# Patient Record
Sex: Female | Born: 1955 | Race: Black or African American | Hispanic: No | Smoking: Never smoker
Health system: Southern US, Community
[De-identification: ages and names within clinical notes are randomized; demographics above are authoritative.]

## PROBLEM LIST (undated history)

## (undated) DIAGNOSIS — I272 Pulmonary hypertension, unspecified: Secondary | ICD-10-CM

## (undated) DIAGNOSIS — F99 Mental disorder, not otherwise specified: Secondary | ICD-10-CM

## (undated) DIAGNOSIS — H547 Unspecified visual loss: Secondary | ICD-10-CM

## (undated) DIAGNOSIS — I10 Essential (primary) hypertension: Secondary | ICD-10-CM

## (undated) DIAGNOSIS — I509 Heart failure, unspecified: Secondary | ICD-10-CM

## (undated) DIAGNOSIS — N186 End stage renal disease: Secondary | ICD-10-CM

## (undated) DIAGNOSIS — I2510 Atherosclerotic heart disease of native coronary artery without angina pectoris: Secondary | ICD-10-CM

## (undated) DIAGNOSIS — I469 Cardiac arrest, cause unspecified: Secondary | ICD-10-CM

## (undated) DIAGNOSIS — D259 Leiomyoma of uterus, unspecified: Secondary | ICD-10-CM

## (undated) DIAGNOSIS — E785 Hyperlipidemia, unspecified: Secondary | ICD-10-CM

## (undated) DIAGNOSIS — R011 Cardiac murmur, unspecified: Secondary | ICD-10-CM

## (undated) DIAGNOSIS — I071 Rheumatic tricuspid insufficiency: Secondary | ICD-10-CM

## (undated) DIAGNOSIS — I219 Acute myocardial infarction, unspecified: Secondary | ICD-10-CM

## (undated) DIAGNOSIS — H9190 Unspecified hearing loss, unspecified ear: Secondary | ICD-10-CM

## (undated) DIAGNOSIS — M869 Osteomyelitis, unspecified: Secondary | ICD-10-CM

## (undated) DIAGNOSIS — N189 Chronic kidney disease, unspecified: Secondary | ICD-10-CM

## (undated) DIAGNOSIS — D649 Anemia, unspecified: Secondary | ICD-10-CM

## (undated) DIAGNOSIS — I120 Hypertensive chronic kidney disease with stage 5 chronic kidney disease or end stage renal disease: Secondary | ICD-10-CM

## (undated) DIAGNOSIS — R600 Localized edema: Secondary | ICD-10-CM

## (undated) DIAGNOSIS — D631 Anemia in chronic kidney disease: Secondary | ICD-10-CM

## (undated) DIAGNOSIS — Z992 Dependence on renal dialysis: Secondary | ICD-10-CM

## (undated) HISTORY — DX: Essential (primary) hypertension: I10

## (undated) HISTORY — DX: Leiomyoma of uterus, unspecified: D25.9

## (undated) HISTORY — DX: Cardiac arrest, cause unspecified: I46.9

## (undated) HISTORY — DX: Anemia, unspecified: D64.9

## (undated) HISTORY — PX: NO PAST SURGERIES: SHX2092

## (undated) HISTORY — DX: Rheumatic tricuspid insufficiency: I07.1

## (undated) HISTORY — DX: Hypertensive chronic kidney disease with stage 5 chronic kidney disease or end stage renal disease: I12.0

## (undated) HISTORY — DX: Anemia in chronic kidney disease: D63.1

## (undated) HISTORY — DX: Pulmonary hypertension, unspecified: I27.20

## (undated) HISTORY — DX: Chronic kidney disease, unspecified: N18.9

## (undated) HISTORY — DX: Unspecified hearing loss, unspecified ear: H91.90

## (undated) HISTORY — DX: Mental disorder, not otherwise specified: F99

## (undated) HISTORY — DX: Localized edema: R60.0

## (undated) HISTORY — DX: Hyperlipidemia, unspecified: E78.5

## (undated) HISTORY — DX: End stage renal disease: N18.6

---

## 2003-08-16 ENCOUNTER — Emergency Department (HOSPITAL_COMMUNITY): Admission: EM | Admit: 2003-08-16 | Discharge: 2003-08-16 | Payer: Self-pay | Admitting: Emergency Medicine

## 2003-08-20 ENCOUNTER — Other Ambulatory Visit: Admission: RE | Admit: 2003-08-20 | Discharge: 2003-08-20 | Payer: Self-pay | Admitting: Obstetrics and Gynecology

## 2004-11-22 ENCOUNTER — Inpatient Hospital Stay (HOSPITAL_COMMUNITY): Admission: AD | Admit: 2004-11-22 | Discharge: 2004-11-23 | Payer: Self-pay | Admitting: *Deleted

## 2004-12-05 ENCOUNTER — Ambulatory Visit (HOSPITAL_COMMUNITY): Admission: RE | Admit: 2004-12-05 | Discharge: 2004-12-05 | Payer: Self-pay | Admitting: Obstetrics and Gynecology

## 2004-12-05 ENCOUNTER — Encounter (INDEPENDENT_AMBULATORY_CARE_PROVIDER_SITE_OTHER): Payer: Self-pay | Admitting: Specialist

## 2004-12-05 ENCOUNTER — Ambulatory Visit: Payer: Self-pay | Admitting: Obstetrics and Gynecology

## 2006-05-01 ENCOUNTER — Emergency Department (HOSPITAL_COMMUNITY): Admission: EM | Admit: 2006-05-01 | Discharge: 2006-05-01 | Payer: Self-pay | Admitting: Family Medicine

## 2006-12-24 ENCOUNTER — Inpatient Hospital Stay (HOSPITAL_COMMUNITY): Admission: AD | Admit: 2006-12-24 | Discharge: 2006-12-25 | Payer: Self-pay | Admitting: Gynecology

## 2006-12-30 ENCOUNTER — Inpatient Hospital Stay (HOSPITAL_COMMUNITY): Admission: AD | Admit: 2006-12-30 | Discharge: 2006-12-30 | Payer: Self-pay | Admitting: Obstetrics and Gynecology

## 2006-12-31 ENCOUNTER — Inpatient Hospital Stay (HOSPITAL_COMMUNITY): Admission: AD | Admit: 2006-12-31 | Discharge: 2006-12-31 | Payer: Self-pay | Admitting: Obstetrics and Gynecology

## 2007-01-04 ENCOUNTER — Inpatient Hospital Stay (HOSPITAL_COMMUNITY): Admission: AD | Admit: 2007-01-04 | Discharge: 2007-01-04 | Payer: Self-pay | Admitting: Obstetrics and Gynecology

## 2007-02-02 ENCOUNTER — Emergency Department (HOSPITAL_COMMUNITY): Admission: EM | Admit: 2007-02-02 | Discharge: 2007-02-03 | Payer: Self-pay | Admitting: Family Medicine

## 2007-04-23 ENCOUNTER — Inpatient Hospital Stay (HOSPITAL_COMMUNITY): Admission: AD | Admit: 2007-04-23 | Discharge: 2007-04-23 | Payer: Self-pay | Admitting: Gynecology

## 2007-05-08 ENCOUNTER — Encounter: Payer: Self-pay | Admitting: Obstetrics and Gynecology

## 2007-05-08 ENCOUNTER — Ambulatory Visit: Payer: Self-pay | Admitting: Obstetrics and Gynecology

## 2007-05-22 ENCOUNTER — Ambulatory Visit (HOSPITAL_COMMUNITY): Admission: RE | Admit: 2007-05-22 | Discharge: 2007-05-22 | Payer: Self-pay | Admitting: Obstetrics and Gynecology

## 2007-06-27 ENCOUNTER — Emergency Department (HOSPITAL_COMMUNITY): Admission: EM | Admit: 2007-06-27 | Discharge: 2007-06-27 | Payer: Self-pay | Admitting: Family Medicine

## 2007-11-19 ENCOUNTER — Inpatient Hospital Stay (HOSPITAL_COMMUNITY): Admission: AD | Admit: 2007-11-19 | Discharge: 2007-11-19 | Payer: Self-pay | Admitting: Gynecology

## 2007-12-05 ENCOUNTER — Inpatient Hospital Stay (HOSPITAL_COMMUNITY): Admission: AD | Admit: 2007-12-05 | Discharge: 2007-12-05 | Payer: Self-pay | Admitting: Obstetrics & Gynecology

## 2007-12-12 ENCOUNTER — Ambulatory Visit: Payer: Self-pay | Admitting: Obstetrics & Gynecology

## 2007-12-13 ENCOUNTER — Observation Stay (HOSPITAL_COMMUNITY): Admission: AD | Admit: 2007-12-13 | Discharge: 2007-12-14 | Payer: Self-pay | Admitting: Obstetrics and Gynecology

## 2007-12-23 ENCOUNTER — Ambulatory Visit: Payer: Self-pay | Admitting: Obstetrics and Gynecology

## 2007-12-23 ENCOUNTER — Encounter: Payer: Self-pay | Admitting: Obstetrics and Gynecology

## 2007-12-23 ENCOUNTER — Ambulatory Visit (HOSPITAL_COMMUNITY): Admission: RE | Admit: 2007-12-23 | Discharge: 2007-12-23 | Payer: Self-pay | Admitting: Obstetrics and Gynecology

## 2008-01-20 ENCOUNTER — Emergency Department (HOSPITAL_COMMUNITY): Admission: EM | Admit: 2008-01-20 | Discharge: 2008-01-20 | Payer: Self-pay | Admitting: Family Medicine

## 2008-10-13 ENCOUNTER — Inpatient Hospital Stay (HOSPITAL_COMMUNITY): Admission: AD | Admit: 2008-10-13 | Discharge: 2008-10-13 | Payer: Self-pay | Admitting: Obstetrics & Gynecology

## 2009-01-19 ENCOUNTER — Emergency Department (HOSPITAL_COMMUNITY): Admission: EM | Admit: 2009-01-19 | Discharge: 2009-01-19 | Payer: Self-pay | Admitting: Emergency Medicine

## 2009-02-02 ENCOUNTER — Emergency Department (HOSPITAL_COMMUNITY): Admission: EM | Admit: 2009-02-02 | Discharge: 2009-02-02 | Payer: Self-pay | Admitting: Family Medicine

## 2009-07-05 ENCOUNTER — Ambulatory Visit: Payer: Self-pay | Admitting: Advanced Practice Midwife

## 2009-07-05 ENCOUNTER — Inpatient Hospital Stay (HOSPITAL_COMMUNITY): Admission: AD | Admit: 2009-07-05 | Discharge: 2009-07-06 | Payer: Self-pay | Admitting: Obstetrics & Gynecology

## 2009-07-19 ENCOUNTER — Emergency Department (HOSPITAL_COMMUNITY): Admission: EM | Admit: 2009-07-19 | Discharge: 2009-07-19 | Payer: Self-pay | Admitting: Family Medicine

## 2009-07-20 ENCOUNTER — Inpatient Hospital Stay (HOSPITAL_COMMUNITY): Admission: AD | Admit: 2009-07-20 | Discharge: 2009-07-20 | Payer: Self-pay | Admitting: Obstetrics & Gynecology

## 2009-08-04 ENCOUNTER — Encounter: Payer: Self-pay | Admitting: Obstetrics and Gynecology

## 2009-08-04 ENCOUNTER — Ambulatory Visit: Payer: Self-pay | Admitting: Obstetrics and Gynecology

## 2009-08-04 LAB — CONVERTED CEMR LAB
ALT: 14 units/L (ref 0–35)
AST: 11 units/L (ref 0–37)
Albumin: 3.9 g/dL (ref 3.5–5.2)
Alkaline Phosphatase: 89 units/L (ref 39–117)
BUN: 11 mg/dL (ref 6–23)
CO2: 23 meq/L (ref 19–32)
Calcium: 8.8 mg/dL (ref 8.4–10.5)
Chloride: 96 meq/L (ref 96–112)
Creatinine, Ser: 0.8 mg/dL (ref 0.40–1.20)
FSH: 51.8 milliintl units/mL
Glucose, Bld: 542 mg/dL (ref 70–99)
HCT: 34.3 % — ABNORMAL LOW (ref 36.0–46.0)
Hemoglobin: 11.1 g/dL — ABNORMAL LOW (ref 12.0–15.0)
LH: 33.5 milliintl units/mL
MCHC: 32.4 g/dL (ref 30.0–36.0)
MCV: 92.5 fL (ref 78.0–100.0)
Platelets: 435 10*3/uL — ABNORMAL HIGH (ref 150–400)
Potassium: 4.2 meq/L (ref 3.5–5.3)
RBC: 3.71 M/uL — ABNORMAL LOW (ref 3.87–5.11)
RDW: 13.1 % (ref 11.5–15.5)
Sodium: 132 meq/L — ABNORMAL LOW (ref 135–145)
Total Bilirubin: 0.4 mg/dL (ref 0.3–1.2)
Total Protein: 7.3 g/dL (ref 6.0–8.3)
Trich, Wet Prep: NONE SEEN
WBC: 5.5 10*3/uL (ref 4.0–10.5)
Yeast Wet Prep HPF POC: NONE SEEN

## 2009-08-05 ENCOUNTER — Encounter: Payer: Self-pay | Admitting: Family Medicine

## 2009-08-09 ENCOUNTER — Ambulatory Visit: Payer: Self-pay | Admitting: Family Medicine

## 2009-08-09 ENCOUNTER — Encounter: Payer: Self-pay | Admitting: Family Medicine

## 2009-08-09 DIAGNOSIS — D259 Leiomyoma of uterus, unspecified: Secondary | ICD-10-CM | POA: Insufficient documentation

## 2009-08-09 DIAGNOSIS — F489 Nonpsychotic mental disorder, unspecified: Secondary | ICD-10-CM | POA: Insufficient documentation

## 2009-08-09 DIAGNOSIS — I110 Hypertensive heart disease with heart failure: Secondary | ICD-10-CM | POA: Insufficient documentation

## 2009-08-09 DIAGNOSIS — I10 Essential (primary) hypertension: Secondary | ICD-10-CM

## 2009-08-09 DIAGNOSIS — Z780 Asymptomatic menopausal state: Secondary | ICD-10-CM | POA: Insufficient documentation

## 2009-08-09 DIAGNOSIS — E119 Type 2 diabetes mellitus without complications: Secondary | ICD-10-CM | POA: Insufficient documentation

## 2009-08-09 HISTORY — DX: Leiomyoma of uterus, unspecified: D25.9

## 2009-08-09 HISTORY — DX: Essential (primary) hypertension: I10

## 2009-08-09 LAB — CONVERTED CEMR LAB
ALT: 16 units/L (ref 0–35)
AST: 14 units/L (ref 0–37)
Albumin: 3.9 g/dL (ref 3.5–5.2)
Alkaline Phosphatase: 93 units/L (ref 39–117)
BUN: 17 mg/dL (ref 6–23)
CO2: 25 meq/L (ref 19–32)
Calcium: 9.5 mg/dL (ref 8.4–10.5)
Chloride: 93 meq/L — ABNORMAL LOW (ref 96–112)
Creatinine, Ser: 0.87 mg/dL (ref 0.40–1.20)
Direct LDL: 170 mg/dL — ABNORMAL HIGH
Glucose, Bld: 541 mg/dL (ref 70–99)
Hgb A1c MFr Bld: 10.5 %
Potassium: 4.4 meq/L (ref 3.5–5.3)
Sodium: 132 meq/L — ABNORMAL LOW (ref 135–145)
TSH: 1.785 microintl units/mL (ref 0.350–4.500)
Total Bilirubin: 0.3 mg/dL (ref 0.3–1.2)
Total Protein: 7.5 g/dL (ref 6.0–8.3)

## 2009-08-19 ENCOUNTER — Ambulatory Visit: Payer: Self-pay | Admitting: Obstetrics and Gynecology

## 2009-08-27 ENCOUNTER — Ambulatory Visit (HOSPITAL_COMMUNITY): Admission: RE | Admit: 2009-08-27 | Discharge: 2009-08-27 | Payer: Self-pay | Admitting: Family Medicine

## 2009-09-01 ENCOUNTER — Ambulatory Visit: Payer: Self-pay | Admitting: Family Medicine

## 2009-09-22 ENCOUNTER — Telehealth: Payer: Self-pay | Admitting: *Deleted

## 2009-09-22 ENCOUNTER — Ambulatory Visit: Payer: Self-pay | Admitting: Family Medicine

## 2009-09-25 ENCOUNTER — Telehealth: Payer: Self-pay | Admitting: Family Medicine

## 2009-09-25 ENCOUNTER — Emergency Department (HOSPITAL_COMMUNITY): Admission: EM | Admit: 2009-09-25 | Discharge: 2009-09-25 | Payer: Self-pay | Admitting: Family Medicine

## 2009-09-26 ENCOUNTER — Telehealth: Payer: Self-pay | Admitting: Family Medicine

## 2009-09-28 ENCOUNTER — Emergency Department (HOSPITAL_COMMUNITY): Admission: EM | Admit: 2009-09-28 | Discharge: 2009-09-29 | Payer: Self-pay | Admitting: Emergency Medicine

## 2009-09-28 ENCOUNTER — Ambulatory Visit: Payer: Self-pay | Admitting: Family Medicine

## 2009-09-29 ENCOUNTER — Observation Stay (HOSPITAL_COMMUNITY): Admission: AD | Admit: 2009-09-29 | Discharge: 2009-09-30 | Payer: Self-pay | Admitting: Emergency Medicine

## 2009-09-29 ENCOUNTER — Ambulatory Visit: Payer: Self-pay | Admitting: Family Medicine

## 2009-09-29 ENCOUNTER — Encounter: Payer: Self-pay | Admitting: Family Medicine

## 2009-09-29 ENCOUNTER — Telehealth: Payer: Self-pay | Admitting: Family Medicine

## 2009-10-05 ENCOUNTER — Ambulatory Visit: Payer: Self-pay | Admitting: Family Medicine

## 2009-10-08 ENCOUNTER — Telehealth: Payer: Self-pay | Admitting: Family Medicine

## 2009-10-27 ENCOUNTER — Telehealth: Payer: Self-pay | Admitting: *Deleted

## 2009-10-27 ENCOUNTER — Telehealth: Payer: Self-pay | Admitting: Family Medicine

## 2009-12-03 ENCOUNTER — Ambulatory Visit: Payer: Self-pay | Admitting: Family Medicine

## 2009-12-03 ENCOUNTER — Encounter: Payer: Self-pay | Admitting: *Deleted

## 2009-12-03 DIAGNOSIS — H913 Deaf nonspeaking, not elsewhere classified: Secondary | ICD-10-CM | POA: Insufficient documentation

## 2009-12-06 ENCOUNTER — Telehealth: Payer: Self-pay | Admitting: Family Medicine

## 2009-12-07 ENCOUNTER — Telehealth: Payer: Self-pay | Admitting: *Deleted

## 2009-12-07 ENCOUNTER — Telehealth: Payer: Self-pay | Admitting: Family Medicine

## 2009-12-07 ENCOUNTER — Encounter: Payer: Self-pay | Admitting: Family Medicine

## 2009-12-10 ENCOUNTER — Encounter: Payer: Self-pay | Admitting: Family Medicine

## 2009-12-13 ENCOUNTER — Telehealth: Payer: Self-pay | Admitting: Family Medicine

## 2010-01-04 ENCOUNTER — Ambulatory Visit: Payer: Self-pay | Admitting: Family Medicine

## 2010-01-04 LAB — CONVERTED CEMR LAB: Hgb A1c MFr Bld: 9.6 %

## 2010-01-07 ENCOUNTER — Emergency Department (HOSPITAL_COMMUNITY): Admission: EM | Admit: 2010-01-07 | Discharge: 2010-01-07 | Payer: Self-pay | Admitting: Family Medicine

## 2010-01-08 ENCOUNTER — Encounter (INDEPENDENT_AMBULATORY_CARE_PROVIDER_SITE_OTHER): Payer: Self-pay | Admitting: Specialist

## 2010-01-08 ENCOUNTER — Ambulatory Visit: Payer: Self-pay | Admitting: Surgery

## 2010-01-08 ENCOUNTER — Ambulatory Visit: Payer: Self-pay | Admitting: Family Medicine

## 2010-01-08 ENCOUNTER — Inpatient Hospital Stay (HOSPITAL_COMMUNITY): Admission: EM | Admit: 2010-01-08 | Discharge: 2010-01-09 | Payer: Self-pay | Admitting: Emergency Medicine

## 2010-01-19 ENCOUNTER — Ambulatory Visit: Payer: Self-pay | Admitting: Family Medicine

## 2010-01-19 DIAGNOSIS — R609 Edema, unspecified: Secondary | ICD-10-CM | POA: Insufficient documentation

## 2010-02-09 ENCOUNTER — Encounter: Payer: Self-pay | Admitting: Family Medicine

## 2010-02-09 ENCOUNTER — Ambulatory Visit: Payer: Self-pay | Admitting: Family Medicine

## 2010-02-10 LAB — CONVERTED CEMR LAB
ALT: 35 units/L (ref 0–35)
AST: 15 units/L (ref 0–37)
Albumin: 4.1 g/dL (ref 3.5–5.2)
Alkaline Phosphatase: 92 units/L (ref 39–117)
BUN: 20 mg/dL (ref 6–23)
CO2: 27 meq/L (ref 19–32)
Calcium: 9.7 mg/dL (ref 8.4–10.5)
Chloride: 98 meq/L (ref 96–112)
Cholesterol: 237 mg/dL — ABNORMAL HIGH (ref 0–200)
Creatinine, Ser: 0.87 mg/dL (ref 0.40–1.20)
Glucose, Bld: 225 mg/dL — ABNORMAL HIGH (ref 70–99)
HDL: 51 mg/dL (ref >39)
LDL Cholesterol: 170 mg/dL — ABNORMAL HIGH (ref 0–99)
Potassium: 4.4 meq/L (ref 3.5–5.3)
Sodium: 136 meq/L (ref 135–145)
Total Bilirubin: 0.5 mg/dL (ref 0.3–1.2)
Total CHOL/HDL Ratio: 4.6
Total Protein: 7.7 g/dL (ref 6.0–8.3)
Triglycerides: 78 mg/dL (ref <150)
VLDL: 16 mg/dL (ref 0–40)

## 2010-02-22 ENCOUNTER — Telehealth: Payer: Self-pay | Admitting: Family Medicine

## 2010-02-28 ENCOUNTER — Ambulatory Visit: Payer: Self-pay | Admitting: Family Medicine

## 2010-02-28 DIAGNOSIS — E785 Hyperlipidemia, unspecified: Secondary | ICD-10-CM | POA: Insufficient documentation

## 2010-03-23 ENCOUNTER — Ambulatory Visit: Payer: Self-pay | Admitting: Family Medicine

## 2010-04-06 ENCOUNTER — Emergency Department (HOSPITAL_COMMUNITY): Admission: EM | Admit: 2010-04-06 | Discharge: 2010-04-06 | Payer: Self-pay | Admitting: Family Medicine

## 2010-05-19 ENCOUNTER — Encounter: Payer: Self-pay | Admitting: Family Medicine

## 2010-05-19 ENCOUNTER — Ambulatory Visit: Payer: Self-pay | Admitting: Family Medicine

## 2010-05-19 DIAGNOSIS — M79609 Pain in unspecified limb: Secondary | ICD-10-CM | POA: Insufficient documentation

## 2010-05-19 LAB — CONVERTED CEMR LAB
BUN: 14 mg/dL (ref 6–23)
CO2: 27 meq/L (ref 19–32)
Calcium: 9.5 mg/dL (ref 8.4–10.5)
Chloride: 97 meq/L (ref 96–112)
Creatinine, Ser: 0.75 mg/dL (ref 0.40–1.20)
Glucose, Bld: 391 mg/dL — ABNORMAL HIGH (ref 70–99)
Potassium: 4.1 meq/L (ref 3.5–5.3)
Sodium: 136 meq/L (ref 135–145)

## 2010-06-02 ENCOUNTER — Encounter: Payer: Self-pay | Admitting: Family Medicine

## 2010-06-03 ENCOUNTER — Ambulatory Visit: Payer: Self-pay | Admitting: Family Medicine

## 2010-06-17 ENCOUNTER — Encounter: Payer: Self-pay | Admitting: Family Medicine

## 2010-06-17 ENCOUNTER — Ambulatory Visit: Payer: Self-pay | Admitting: Family Medicine

## 2010-06-17 LAB — CONVERTED CEMR LAB
BUN: 12 mg/dL (ref 6–23)
CO2: 26 meq/L (ref 19–32)
Calcium: 9.1 mg/dL (ref 8.4–10.5)
Chloride: 97 meq/L (ref 96–112)
Creatinine, Ser: 0.77 mg/dL (ref 0.40–1.20)
Glucose, Bld: 363 mg/dL — ABNORMAL HIGH (ref 70–99)
Potassium: 4.3 meq/L (ref 3.5–5.3)
Sodium: 132 meq/L — ABNORMAL LOW (ref 135–145)

## 2010-07-19 ENCOUNTER — Ambulatory Visit: Payer: Self-pay | Admitting: Family Medicine

## 2011-01-03 ENCOUNTER — Inpatient Hospital Stay (HOSPITAL_COMMUNITY)
Admission: AD | Admit: 2011-01-03 | Discharge: 2011-01-03 | Payer: Self-pay | Source: Home / Self Care | Attending: Obstetrics and Gynecology | Admitting: Obstetrics and Gynecology

## 2011-01-05 ENCOUNTER — Ambulatory Visit (HOSPITAL_COMMUNITY)
Admission: RE | Admit: 2011-01-05 | Discharge: 2011-01-05 | Payer: Self-pay | Source: Home / Self Care | Attending: Obstetrics and Gynecology | Admitting: Obstetrics and Gynecology

## 2011-01-09 LAB — CBC
HCT: 35.3 % — ABNORMAL LOW (ref 36.0–46.0)
Hemoglobin: 12 g/dL (ref 12.0–15.0)
MCH: 30.2 pg (ref 26.0–34.0)
MCHC: 34 g/dL (ref 30.0–36.0)
MCV: 88.7 fL (ref 78.0–100.0)
Platelets: 325 10*3/uL (ref 150–400)
RBC: 3.98 MIL/uL (ref 3.87–5.11)
RDW: 12.1 % (ref 11.5–15.5)
WBC: 6.2 10*3/uL (ref 4.0–10.5)

## 2011-01-09 LAB — URINALYSIS, ROUTINE W REFLEX MICROSCOPIC
Bilirubin Urine: NEGATIVE
Ketones, ur: NEGATIVE mg/dL
Leukocytes, UA: NEGATIVE
Nitrite: NEGATIVE
Protein, ur: 100 mg/dL — AB
Specific Gravity, Urine: 1.025 (ref 1.005–1.030)
Urine Glucose, Fasting: 250 mg/dL — AB
Urobilinogen, UA: 0.2 mg/dL (ref 0.0–1.0)
pH: 6 (ref 5.0–8.0)

## 2011-01-09 LAB — URINE MICROSCOPIC-ADD ON

## 2011-01-11 ENCOUNTER — Emergency Department (HOSPITAL_COMMUNITY)
Admission: EM | Admit: 2011-01-11 | Discharge: 2011-01-11 | Payer: Self-pay | Source: Home / Self Care | Admitting: Emergency Medicine

## 2011-01-11 ENCOUNTER — Ambulatory Visit
Admission: RE | Admit: 2011-01-11 | Payer: Self-pay | Source: Home / Self Care | Attending: Obstetrics and Gynecology | Admitting: Obstetrics and Gynecology

## 2011-01-16 LAB — BASIC METABOLIC PANEL
BUN: 11 mg/dL (ref 6–23)
CO2: 29 mEq/L (ref 19–32)
Calcium: 9.2 mg/dL (ref 8.4–10.5)
Chloride: 102 mEq/L (ref 96–112)
Creatinine, Ser: 0.57 mg/dL (ref 0.4–1.2)
GFR calc Af Amer: >60 mL/min (ref >60)
GFR calc non Af Amer: >60 mL/min (ref >60)
Glucose, Bld: 157 mg/dL — ABNORMAL HIGH (ref 70–99)
Potassium: 3.8 mEq/L (ref 3.5–5.1)
Sodium: 138 mEq/L (ref 135–145)

## 2011-01-16 LAB — GC/CHLAMYDIA PROBE AMP, GENITAL
Chlamydia, DNA Probe: NEGATIVE
GC Probe Amp, Genital: NEGATIVE

## 2011-01-16 LAB — DIFFERENTIAL
Basophils Absolute: 0 10*3/uL (ref 0.0–0.1)
Basophils Relative: 1 % (ref 0–1)
Eosinophils Absolute: 0.2 10*3/uL (ref 0.0–0.7)
Eosinophils Relative: 4 % (ref 0–5)
Lymphocytes Relative: 35 % (ref 12–46)
Lymphs Abs: 2.4 10*3/uL (ref 0.7–4.0)
Monocytes Absolute: 0.6 10*3/uL (ref 0.1–1.0)
Monocytes Relative: 8 % (ref 3–12)
Neutro Abs: 3.7 10*3/uL (ref 1.7–7.7)
Neutrophils Relative %: 53 % (ref 43–77)

## 2011-01-16 LAB — CBC
HCT: 33.9 % — ABNORMAL LOW (ref 36.0–46.0)
Hemoglobin: 11 g/dL — ABNORMAL LOW (ref 12.0–15.0)
MCH: 29 pg (ref 26.0–34.0)
MCHC: 32.4 g/dL (ref 30.0–36.0)
MCV: 89.4 fL (ref 78.0–100.0)
Platelets: 393 10*3/uL (ref 150–400)
RBC: 3.79 MIL/uL — ABNORMAL LOW (ref 3.87–5.11)
RDW: 12.2 % (ref 11.5–15.5)
WBC: 7 10*3/uL (ref 4.0–10.5)

## 2011-01-16 LAB — WET PREP, GENITAL
Clue Cells Wet Prep HPF POC: NONE SEEN
Trich, Wet Prep: NONE SEEN
Yeast Wet Prep HPF POC: NONE SEEN

## 2011-01-16 LAB — PROTIME-INR
INR: 0.89 (ref 0.00–1.49)
Prothrombin Time: 12.3 seconds (ref 11.6–15.2)

## 2011-01-16 LAB — APTT: aPTT: 27 seconds (ref 24–37)

## 2011-01-16 LAB — POCT PREGNANCY, URINE: Preg Test, Ur: NEGATIVE

## 2011-01-24 NOTE — Miscellaneous (Signed)
Summary: new dm supplies  Clinical Lists Changes she needs a new meter, strips & lancets. Pharmacy unable to get the strips or lancets. dtr states they have always had a problem with supplies. told her I will ask md to order one that is common & easy to get refills for. to pcp. [facility] on [address]. sh eis totally out of supplies.[name] Radon RN  December 10, 2009 11:52 AM  Medications: Added new medication of PRODIGY BLOOD GLUCOSE MONITOR  DEVI (BLOOD GLUCOSE MONITORING SUPPL) for checking sugars two times a day - Signed Changed medication from LANCETS  MISC (LANCETS) lancets for blood sugar testing three times a day; disp 1 month's supply to PRODIGY TWIST TOP LANCETS 28G  MISC (LANCETS) for checking sugars two times a day; disp QS for 3 months - Signed Added new medication of PRODIGY BLOOD GLUCOSE TEST  STRP (GLUCOSE BLOOD) for checking sugars two times a day ; disp 1 box - Signed Rx of PRODIGY BLOOD GLUCOSE MONITOR  DEVI (BLOOD GLUCOSE MONITORING SUPPL) for checking sugars two times a day;  #1 x 0;  Signed;  Entered by: Eugenie Norrie  MD;  Authorized by: Eugenie Norrie  MD;  Method used: Electronically to [facility]  [phone]*, [address] [place], [place], [place]  [address], Ph: [phone] or [phone], Fax: [phone] Rx of PRODIGY TWIST TOP LANCETS 28G  MISC (LANCETS) for checking sugars two times a day; disp QS for 3 months;  #1 x 11;  Signed;  Entered by: Eugenie Norrie  MD;  Authorized by: Eugenie Norrie  MD;  Method used: Electronically to [facility]  [phone]*, [address] [place], [place], [place]  [address], Ph: [phone] or [phone], Fax: [phone] Rx of PRODIGY BLOOD GLUCOSE TEST  STRP (GLUCOSE BLOOD) for checking sugars two times a day ; disp 1 box;  #1 x 11;  Signed;  Entered by: Eugenie Norrie  MD;  Authorized by: Eugenie Norrie  MD;  Method used: Electronically to [facility]  [phone]*, [address] [place], [place], [place]  [address], Ph: [phone] or [phone], Fax: [phone]    Prescriptions: PRODIGY BLOOD GLUCOSE TEST  STRP (GLUCOSE BLOOD) for checking sugars two times a day ; disp 1 box  #1 x 11   Entered and Authorized by:   Eugenie Norrie  MD   Signed by:   Eugenie Norrie  MD on 12/10/2009   Method used:   Electronically to        [facility]  [phone]* (retail)       [place], [place]  [address]       Ph: [phone] or [phone]       Fax: [phone]   RxID:   [id number] PRODIGY TWIST TOP LANCETS 28G  MISC (LANCETS) for checking sugars two times a day; disp QS for 3 months  #1 x 11   Entered and Authorized by:   Eugenie Norrie  MD   Signed by:   Eugenie Norrie  MD on 12/10/2009   Method used:   Electronically to        [facility]  [phone]* (retail)       [address]       [place],   [address]       Ph: [phone] or [phone]  Fax: [phone]   RxIDAA:[id number] PRODIGY BLOOD GLUCOSE MONITOR  DEVI (BLOOD GLUCOSE MONITORING SUPPL) for checking sugars two times a day  #1 x 0   Entered and Authorized by:   Eugenie Norrie  MD   Signed by:   Eugenie Norrie  MD on 12/10/2009   Method used:   Electronically to        [facility]  [phone]* (retail)       [address]       [place], [place]  [address]       Ph: [phone] or [phone]       Fax: [phone]   RxID:   [id number]

## 2011-01-24 NOTE — Assessment & Plan Note (Signed)
Summary: f/u eo   Vital Signs:  Patient profile:   55 year old female Height:      66 inches Weight:      189 pounds BMI:     30.62 BSA:     1.95 Temp:     98.4 degrees F Pulse rate:   118 / minute BP sitting:   160 / 94  Vitals Entered By: Christen Bame CMA (February 09, 2010 11:01 AM)  Serial Vital Signs/Assessments:  Comments: 11:02 AM Manual BP: 160/94 By: Christen Bame CMA   CC: f/u Pain Assessment Patient in pain? no        Primary Care Provider:  Eugenie Norrie  MD  CC:  f/u.  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 Pt persistently has issues with adhering to medication regimen, psych issues interplay in to this frequently. Refuses to be seen again at mental health.  **entire visit conducted with sign language interpreter  1. HTN Brings in her meds today. Hasn't taken anything because she was planning to have blood drawn and hasn't had anything to eat or drink. Pill box count indicates that she has the right meds in place. Has not taken meds for today but they are correct. States that she has checked her blood pressure at [facility] and it was better than it is today.  Previously admitted to the hospital to see if medications were working and it was documented that they do effectively reduce her blood pressure into the normal range.   2. diabetes A1c in January 9.6. On metformin 500 mg daily. States that she's taking this medicine. Sugars are from 100s to 300s.   Habits & Providers  Alcohol-Tobacco-Diet     Tobacco Status: never  Current Medications (verified): 1)  Hydrochlorothiazide 25 Mg Tabs (Hydrochlorothiazide) .... One By Mouth Daily 2)  Amlodipine Besylate 10 Mg Tabs (Amlodipine Besylate) .... One By Mouth Daily For Blood Pressure 3)  Metformin Hcl 1000 Mg Tabs (Metformin Hcl) .... One By Mouth Two Times A Day 4)  Lisinopril 20 Mg Tabs (Lisinopril) .... One  By Mouth Daily For Blood Pressure 5)  Blood Glucose Test  Strp (Glucose Blood) .... Please Check Sugars Three Times A Day; Disp 1 Month Supply 6)  Relion Lancing Device  Misc (Lancet Devices) .... For Checking Sugars Two Times A Day 7)  Relion Confirm Glucose Monitor W/device Kit (Blood Glucose Monitoring Suppl) .... For Checking Sugars Two Times A Day 8)  Relion Blood Glucose Test  Strp (Glucose Blood) .... For Checking Sugars Two Times A Day  Allergies (verified): No Known Drug Allergies  Past History:  Past Medical History: HTN - poorly controlled diabetes - longstanding per pt's daughter with history of nonadherence mental health issues (unknown type) - schizophrenia-spectrum with some paranoia per the pt's daughter;  pt will not sign ROI to get records  QTc of 466 10/10  uterine fibroids (endometrial biopsy not possible per OB-gyn)  DUB - endometrial biopsy deemed impossible by clinic at [facility].   Family History: Reviewed history from 09/01/2009 and no changes required. no family history of HTN, diabetes, mental health problems, bleeding or clotting disorders, or cancer per the patient's daughter  Social History: Reviewed history from 12/03/2009 and no changes required. Lives with husband who is apparently also non-verbal. Has a daughter who can help with medicines and directions [name] [phone]) but is sometimes overwhelmed by the patient's issues.  HH had previously been going out to the house but has since d/c'd the patient. HHN Stephanie [phone]).   Review of Systems       no chest pain, SOB. Leg is feeling better.   Physical Exam  Additional Exam:  General:  Vital signs reviewed -- obese, hypertensive. Alert, oriented x 3. WD/WN Lungs:  work of breathing unlabored, clear to auscultation bilaterally; no wheezes, rales, or ronchi; good air movement throughout Heart:  regular rate and rhythm, no murmurs; normal s1/s2 Extremities:  trace BLE edema; no  erythema, drainage, or discharge on either extremity. MSK: has a small 8 x 8 mm healing ulcer on plantar surface of 3rd left toe; no erythema, drainage, or discharge.  Neurologic:  alert and oriented. speech normal. Psych:  less tangential and labile than has been seen previously. Requires frequent rediretion. Quite pleasant today.    Impression & Recommendations:  Problem # 1:  HYPERTENSION, ESSENTIAL, UNCONTROLLED (ICD-401.9) Assessment Improved  improved today. Hasn't taken meds yet. Will therefore not increase regimen. discussed the health risks of poorly controlled BP.   Her updated medication list for this problem includes:    Hydrochlorothiazide 25 Mg Tabs (Hydrochlorothiazide) ..... One by mouth daily    Amlodipine Besylate 10 Mg Tabs (Amlodipine besylate) ..... One by mouth daily for blood pressure    Lisinopril 20 Mg Tabs (Lisinopril) ..... One by mouth daily for blood pressure  Orders: [place]- Est Level  3 ([address])  Problem # 2:  DM (ICD-250.00) Assessment: Unchanged sugar is 221 on fingerstick in clinic. Given report of elevated sugars will titrate metformin to 1000 mg two times a day today. Pt expresses understanding of new dose. follow-up in 4-6 weeks. Check lipid panel and CMET today as she is fasting. Her updated medication list for this problem includes:    Metformin Hcl 1000 Mg Tabs (Metformin hcl) ..... One by mouth two times a day    Lisinopril 20 Mg Tabs (Lisinopril) ..... One by mouth daily for blood pressure  Orders: Glucose Cap-[facility] [id number]) Lipid-[facility] [id number]) Comp Met-[facility] [id number]) [facility]- Est Level  3 [id number])  Complete Medication List: 1)  Hydrochlorothiazide 25 Mg Tabs (Hydrochlorothiazide) .... One by mouth daily 2)  Amlodipine Besylate 10 Mg Tabs (Amlodipine besylate) .... One by mouth daily for blood pressure 3)  Metformin Hcl 1000 Mg Tabs (Metformin hcl) .... One by mouth two times a day 4)  Lisinopril 20 Mg Tabs (Lisinopril) .... One by mouth  daily for blood pressure 5)  Blood Glucose Test Strp (Glucose blood) .... Please check sugars three times a day; disp 1 month supply 6)  Relion Lancing Device Misc (Lancet devices) .... For checking sugars two times a day 7)  Relion Confirm Glucose Monitor W/device Kit (Blood glucose monitoring suppl) .... For checking sugars two times a day 8)  Relion Blood Glucose Test Strp (Glucose blood) .... For checking sugars two times a day  Patient Instructions: 1)  increase the metformin to 1000 mg two times a day. I'll send in a new prescription 2)  continue your other medicines 3)  you are doing a great job! 4)  follow-up with me in 4-6 weeks. Prescriptions: METFORMIN HCL 1000 MG TABS (METFORMIN HCL) one by mouth two times a day  #60 x 2   Entered and Authorized by:   Eugenie Norrie  MD   Signed by:   Eugenie Norrie  MD on 02/09/2010   Method used:   Electronically to  [facility]  [phone]* (retail)       [address]       [place], [place]  [address]       Ph: [phone] or [phone]       Fax: [phone]   RxID:   [id number]    Prevention & Chronic Care Immunizations   Influenza vaccine: Not documented    Tetanus booster: Not documented    Pneumococcal vaccine: Not documented  Colorectal Screening   Hemoccult: Not documented    Colonoscopy: Not documented  Other Screening   Pap smear: Not documented    Mammogram: Not documented   Smoking status: never  (02/09/2010)  Diabetes Mellitus   HgbA1C: 9.6  (01/04/2010)    Eye exam: Not documented    Foot exam: yes  (01/19/2010)   High risk foot: Not documented   Foot care education: Not documented    Urine microalbumin/creatinine ratio: Not documented    Diabetes flowsheet reviewed?: Yes   Progress toward A1C goal: Improved  Lipids   Total Cholesterol: Not documented   LDL: Not documented   LDL Direct: 170  (08/09/2009)   HDL: Not documented   Triglycerides:  Not documented  Hypertension   Last Blood Pressure: 160 / 94  (02/09/2010)   Serum creatinine: 0.87  (08/09/2009)   Serum potassium 4.4  (08/09/2009) CMP ordered     Hypertension flowsheet reviewed?: Yes   Progress toward BP goal: Improved  Self-Management Support :   Personal Goals (by the next clinic visit) :     Personal A1C goal: 8  (09/01/2009)     Personal blood pressure goal: 140/90  (09/01/2009)     Personal LDL goal: 100  (09/01/2009)    Diabetes self-management support: Written self-care plan, Education handout  (09/22/2009)    Diabetes self-management support not done because: Good outcomes  (02/09/2010)    Hypertension self-management support: Written self-care plan, Education handout  (09/22/2009)    Hypertension self-management support not done because: Good outcomes  (02/09/2010)

## 2011-01-24 NOTE — Assessment & Plan Note (Signed)
Summary: np,df   Vital Signs:  Patient profile:   55 year old female Height:      66 inches Weight:      196.9 pounds BMI:     31.90 Temp:     97.6 degrees F oral Pulse rate:   104 / minute BP sitting:   163 / 87  (left arm) Cuff size:   large  Vitals Entered By: Levert Feinstein LPN (August 16, 624THL 9:36 AM) CC: new patient   CC:  new patient.  History of Present Illness: NOTE: pt is deaf and mute. She presents to visit 30 minutes late with interpreter. Pt has no health information filled out, does not have any of her medicines, and cannot provide an adequate history, even through the interpreter. From the only note that was sent to me, pt was referred by [facility] for treatment of blood pressure and diabete, which is uncontrolled and untreated.   1. HTN Pt BP was elevated to [id number] systolic on previous visit 8/11 to [facility] clinic for uterine fibroids. That visit is reviewed here today. Pt does not know her medications.  2. diabetes Sugars in 500s on previous lab drawn at [facility]. Sounds like this is [place]-standing and untreated from notes that I'm able to review. Pt not able to provide a history on this problem  3. Uterine fibroids.  s/p D&C 2 years ago. Has h/o of heavy bleeding but has been controlled recently. FSH and LH indicate that the patient is post-menopausal.   4. Mental health I am unsure of what diagnoses the patient carries. She states several times through the interpreter that she is unhappy with the care that she has received there.   Past History:  Past Medical History: HTN diabetes mental health issues (unknown type) uterine fibroids (endometrial biopsy not possible per OB-gyn)  Social History: Lives with husband. Has a daughter who can help with medicines and directions.  Review of Systems       Denies any chest pain, SOB, problems with bowel or bladder habits. Denies pain.   Physical Exam  General:  vital signs reviewed; hypertensive,  overweight Eyes:  No corneal or conjunctival inflammation noted. EOMI. Perrla. Funduscopic exam benign, without hemorrhages, exudates or papilledema. Vision grossly normal. Neck:  no JVD, no carotid bruits; no thyromegaly. Lungs:  work of breathing unlabored, clear to auscultation bilaterally; no wheezes, rales, or ronchi; good air movement throughout  Heart:  regular rate and rhythm, no murmurs; normal s1/s2  Abdomen:  +BS, soft, non-tender, non-distended; no masses; no rebound or guarding   Pulses:  2+ DP and radial pulses Extremities:  no cyanosis, clubbing, or edema  Neurologic:  alert and oriented. speech normal. station and gait normal. no gross deficitis.  Skin:  Intact without suspicious lesions or rashes Psych:  appears alert and oriented; seems tangential and emotionally labile with questioning. Requires rediretion; pleasant. Limited exam due to communication issues.    Impression & Recommendations:  Problem # 1:  HYPERTENSION, ESSENTIAL, UNCONTROLLED (ICD-401.9) Assessment New Difficult history due to communication problems and lack of information. Asked pt to bring in health history and medications to the next appointment. In the meantime, will start enalpril and have back in 4 weeks. Pt has been in need of PCP for some time. It will take several visits to get things together for this patient.  Her updated medication list for this problem includes:    Lisinopril 5 Mg Tabs (Lisinopril) ..... One by mouth daily  Problem #  2:  DM (ICD-250.00) Assessment: New A1c indicates [place]-term poor control. Will start glipizide daily for now. Likely plan to add metformin in the near future. Check cmet, LDL today as well.   Her updated medication list for this problem includes:    Glipizide 5 Mg Xr24h-tab (Glipizide) ..... One by mouth daily    Lisinopril 5 Mg Tabs (Lisinopril) ..... One by mouth daily  Orders: A1C-[facility] [id number]) Comp Met-[facility] [phone]) Direct LDL-[facility]  [phone]) TSH-[facility] [phone])  Problem # 3:  FIBROIDS, UTERUS (ICD-218.9) Assessment: New Controlled per [facility]. They cannot perform endometrial biopsy due to difficult anatomy.   Problem # 4:  PSYCHIATRIC DISORDER (ICD-300.9) Unclear of diagnosis and pt cannot articulate this. Likely has some level of MR as well. Will try to get records from the [facility] (if this is where she was going). Continue to follow. Asked pt to bring in medication list. She states that her daughter can help get this together.   Complete Medication List: 1)  Glipizide 5 Mg Xr24h-tab (Glipizide) .... One by mouth daily 2)  Lisinopril 5 Mg Tabs (Lisinopril) .... One by mouth daily  Patient Instructions: 1)  Bring all your medications to every appointment. This is very important.  2)  Fill out the health history form with your daughter before you return. 3)  Start the blood pressure medicine (lisinopril) once a day. 4)  Start the diabetes medicine (glipizide) once a day. 5)  follow-up with me in 3-4 weeks.  6)  Very nice to meet you today. Prescriptions: LISINOPRIL 5 MG TABS (LISINOPRIL) one by mouth daily  #30 x 2   Entered and Authorized by:   Eugenie Norrie  MD   Signed by:   Eugenie Norrie  MD on 08/09/2009   Method used:   Electronically to        [facility]. [phone]* (retail)       [address]       [place], [place]  [address]       Ph: [phone]       Fax: [phone]   RxID:   [id number] GLIPIZIDE 5 MG XR24H-TAB (GLIPIZIDE) one by mouth daily  #30 x 2   Entered and Authorized by:   Eugenie Norrie  MD   Signed by:   Eugenie Norrie  MD on 08/09/2009   Method used:   Electronically to        [facility] (retail)       [place]       [place], [place]  [address]       Ph: [phone]       Fax: [phone]   RxID:   [id number]   Prevention & Chronic Care Immunizations   Influenza vaccine:  Not documented    Tetanus booster: Not documented    Pneumococcal vaccine: Not documented  Colorectal Screening   Hemoccult: Not documented    Colonoscopy: Not documented  Other Screening   Pap smear: Not documented    Mammogram: Not documented   Smoking status: Not documented  Diabetes Mellitus   HgbA1C: 10.5  (08/09/2009)    Eye exam: Not documented    Foot exam: Not documented   High risk foot: Not documented   Foot care education: Not documented    Urine microalbumin/creatinine ratio: Not documented  Lipids   Total Cholesterol: Not documented   LDL: Not documented  LDL Direct: Not documented   HDL: Not documented   Triglycerides: Not documented  Hypertension   Last Blood Pressure: 163 / 87  (08/09/2009)   Serum creatinine: 0.80  (08/04/2009)   Serum potassium 4.2  (08/04/2009) CMP ordered   Self-Management Support :    Diabetes self-management support: Not documented    Hypertension self-management support: Not documented   Laboratory Results   Blood Tests   Date/Time Received: August 09, 2009 9:32 AM  Date/Time Reported: August 09, 2009 9:43 AM   HGBA1C: 10.5%   (Normal Range: Non-Diabetic - 3-6%   Control Diabetic - 6-8%)  Comments: ...............test performed by......Marland KitchenBonnie A. Martinique, [place] (ASCP)

## 2011-01-24 NOTE — Assessment & Plan Note (Signed)
Summary: MEET NEW DOC/F/U [facility]   Vital Signs:  Patient profile:   55 year old female Height:      66 inches Weight:      197.4 pounds BMI:     31.98 Temp:     98.5 degrees F oral Pulse rate:   90 / minute BP sitting:   169 / 96  (left arm) Cuff size:   regular  Vitals Entered By: Levert Feinstein LPN (July 26, [id number] AM) CC: Meet New MD Is Patient Diabetic? Yes Did you bring your meter with you today? No Pain Assessment Patient in pain? no        Primary Care Provider:  Eugenie Norrie  MD  CC:  Meet New MD.  History of Present Illness: Meet new MD  Medical issues / concerns 1. HTN: not taking any of her blood pressure medications.  She is skeptical of pharmaceutical medications.  She thinks that they are not helping her.  She realizes the risk of her blood pressure being too high including heart attack and stroke but she still doesn't want to take any medications.  She would be willing to try a more natural approach.  2. Hyperlipidemia:  She is not taking her medicines as prescribed.  Is skeptical of pharmaceutical medications.  She thinks that they are not helping her.  She realizes the risk of her cholesterol being too high including heart attack and stroke.  She would be willing to try a more natural approach  3. DM:  see above.  Warned about risks of not taking her medications  4. Anemia:  She is taking an Iron pill  ROS: endorses some right shoulder pain, occ dizziness, headache, dry skin          denies chest pain, shortness of breath, numbness / weakness.  Habits & Providers  Alcohol-Tobacco-Diet     Tobacco Status: never  Current Medications (verified): 1)  Ferrous Sulfate 325 (65 Fe) Mg Tabs (Ferrous Sulfate)  Allergies: No Known Drug Allergies  Social History: Reviewed history from 12/03/2009 and no changes required. Lives with husband who is apparently also non-verbal. Has a daughter who can help with medicines and directions [name]  [phone]) but is sometimes overwhelmed by the patient's issues.   Physical Exam  General:  vitals signs reviewed -- hypertensive, overweight. no acute distress Head:  normocephalic and atraumatic.   Eyes:  fundoscopic exam unremarkable  Mouth:  oropharynx pink, moist; no erythema or exudate  Neck:  no JVD, no carotid bruits Lungs:  work of breathing unlabored, clear to auscultation bilaterally; no wheezes, rales, or ronchi; good air movement throughout  Heart:  regular rate and rhythm, no murmurs; normal s1/s2  Abdomen:  soft, no distention, no masses, and no guarding.   Extremities:  1+ BLE pitting edema.  Neurologic:  grossly intact. Face is symmetric. Normal gait. No motor weakness/deficit. Skin:  warm, good turgor; no rashes or lesions. brisk cap refill  Psych:  Alert.  Perseverates on stating that the medicines are "not right." No obvious AVH. Communicates persistently during the exam. Is redirectable but has to be redirected frequently. Pleasant today but obstinant about not wanting to take medicines.    Impression & Recommendations:  Problem # 1:  HYPERTENSION, ESSENTIAL, UNCONTROLLED (ICD-401.9) Assessment Unchanged  Still not willing to take medicines.  Will try natural rx including Calcium, Magnesium, Garlic, and Fish Oil.  Would also consider adding [place] and Co-Q-10. The following medications were removed from the medication list:  Hydrochlorothiazide 25 Mg Tabs (Hydrochlorothiazide) ..... One by mouth daily    Amlodipine Besylate 10 Mg Tabs (Amlodipine besylate) ..... One by mouth daily for blood pressure    Losartan Potassium 25 Mg Tabs (Losartan potassium) ..... One by mouth daily for blood pressure  Orders: [place]- Est  Level 4 (99214)  Problem # 2:  HYPERLIPIDEMIA (ICD-272.4) Assessment: Unchanged  Not willing to take medicines.  Starting Fish Oil and Garlic.  Would consider Red Yeast Rice or Niacin. The following medications were removed from the medication  list:    Simvastatin 40 Mg Tabs (Simvastatin) ..... One by mouth daily for cholesterol  Orders: [place]- Est  Level 4 (99214)  Problem # 3:  DM (ICD-250.00) Assessment: Unchanged  Not taking any medications.  Will start natural Rx at next visit. The following medications were removed from the medication list:    Metformin Hcl 1000 Mg Tabs (Metformin hcl) ..... One by mouth two times a day    Losartan Potassium 25 Mg Tabs (Losartan potassium) ..... One by mouth daily for blood pressure  Orders: [facility]- Est  Level 4 (99214)  Complete Medication List: 1)  Ferrous Sulfate 325 (65 Fe) Mg Tabs (Ferrous sulfate)  Patient Instructions: 1)  It was nice to meet you today 2)  I think that the important thing to focus on is your blood pressure 3)  Since you are not willing to take prescription medications here are some more natural supplements that you can take 4)  -Calcium 500 mg twice a day 5)  -Magnesium 250 mg twice a day 6)  -Garlic 0000000 mg twice a day 7)  -Fish Oil 3000 mg twice a day 8)  It is also important for you to limit your salt and carb intake. 9)  Please schedule a follow up appointment in 6 weeks to recheck your blood pressure  Prevention & Chronic Care Immunizations   Influenza vaccine: Not documented    Tetanus booster: Not documented    Pneumococcal vaccine: Not documented  Colorectal Screening   Hemoccult: Not documented    Colonoscopy: Not documented  Other Screening   Pap smear: Not documented    Mammogram: Not documented   Smoking status: never  (07/19/2010)  Diabetes Mellitus   HgbA1C: 10.6  (05/19/2010)    Eye exam: Not documented    Foot exam: yes  (01/19/2010)   High risk foot: Not documented   Foot care education: Not documented    Urine microalbumin/creatinine ratio: Not documented    Diabetes flowsheet reviewed?: Yes   Progress toward A1C goal: Unchanged  Lipids   Total Cholesterol: 237  (02/09/2010)   LDL: 170  (02/09/2010)   LDL Direct:  170  (08/09/2009)   HDL: 51  (02/09/2010)   Triglycerides: 78  (02/09/2010)    SGOT (AST): 15  (02/09/2010)   SGPT (ALT): 35  (02/09/2010)   Alkaline phosphatase: 92  (02/09/2010)   Total bilirubin: 0.5  (02/09/2010)    Lipid flowsheet reviewed?: Yes   Progress toward LDL goal: Unchanged  Hypertension   Last Blood Pressure: 169 / 96  (07/19/2010)   Serum creatinine: 0.77  (06/17/2010)   Serum potassium 4.3  (06/17/2010)    Hypertension flowsheet reviewed?: Yes   Progress toward BP goal: Unchanged  Self-Management Support :   Personal Goals (by the next clinic visit) :     Personal A1C goal: 8  (09/01/2009)     Personal blood pressure goal: 140/90  (09/01/2009)     Personal LDL goal:  130  (03/23/2010)    Diabetes self-management support: Written self-care plan, Education handout  (09/22/2009)    Diabetes self-management support not done because: Not indicated  (05/19/2010)    Hypertension self-management support: Written self-care plan, Education handout  (09/22/2009)    Hypertension self-management support not done because: Refused  (05/19/2010)    Lipid self-management support: Written self-care plan  (09/22/2009)     Lipid self-management support not done because: Not indicated  (05/19/2010)

## 2011-01-24 NOTE — Letter (Signed)
Summary: [facility] [place] By: Raymond Gurney 08/05/2009 16:21:23  _____________________________________________________________________  External Attachment:    Type:   Image     Comment:   External Document

## 2011-01-24 NOTE — Assessment & Plan Note (Signed)
Summary: f/u eo   Vital Signs:  Patient profile:   55 year old female Weight:      198.6 pounds Temp:     98.1 degrees F oral Pulse rate:   101 / minute Pulse rhythm:   regular BP sitting:   206 / 106  (left arm) Cuff size:   large  Vitals Entered By: Audelia Hives CMA (June 03, 2010 9:10 AM) CC: follow-up visit Comments pt ran out of her BP meds has a Rx but does not wish to get it filled. she feels like she is on "too much medications" pt states that some of her meds have been making her "woozy". pt does not want to take any more medications she just wants things to go back to "normal" pt also states that her eyes go blurry, and glasses don't help feels like there's a film over her eyes.   Primary Care Provider:  Eugenie Norrie  MD  CC:  follow-up visit.  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 Pt persistently has issues with adhering to medication regimen, psych issues interplay in to this frequently. Refuses to be seen again at mental health  **interpreter present for entire interview and exam.   1. BP BP very elevated today. Pt states that she's not taking any medicines because "they are wrong. They are not right." Reports blurry vision, worse in the dark. Reports feeling "woozy" at times. Refuses to be admitted to the hospital. Refuses to take the medicines that she is prescribed because she thinks they are making her worse.  chest pain: no    shortness of breath: no headache: no     2. psych Pt is very animated and exhibits evidence of paranoia. States she's not sure if she will come back here because she doesn't think she's being helped. Has stated in the past that she refuses to be seen at [facility]. Diagnosed with pyschotic d/o NOS during hospital stay in October.  Current Medications (verified): 1)  Hydrochlorothiazide 25 Mg Tabs (Hydrochlorothiazide) .... One By Mouth Daily 2)   Amlodipine Besylate 10 Mg Tabs (Amlodipine Besylate) .... One By Mouth Daily For Blood Pressure 3)  Metformin Hcl 1000 Mg Tabs (Metformin Hcl) .... One By Mouth Two Times A Day 4)  Blood Glucose Test  Strp (Glucose Blood) .... Please Check Sugars Three Times A Day; Disp 1 Month Supply 5)  Relion Lancing Device  Misc (Lancet Devices) .... For Checking Sugars Two Times A Day 6)  Relion Confirm Glucose Monitor W/device Kit (Blood Glucose Monitoring Suppl) .... For Checking Sugars Two Times A Day 7)  Relion Blood Glucose Test  Strp (Glucose Blood) .... For Checking Sugars Two Times A Day 8)  Losartan Potassium 25 Mg Tabs (Losartan Potassium) .... One By Mouth Daily For Blood Pressure 9)  Simvastatin 40 Mg Tabs (Simvastatin) .... One By Mouth Daily For Cholesterol 10)  Amitriptyline Hcl 50 Mg Tabs (Amitriptyline Hcl) .... One By Mouth At Bedtime For Pain  Allergies (verified): No Known Drug Allergies  Review of Systems       review of systems as noted in HPI section   Physical Exam  General:  vitals signs reviewed -- hypertensive, overweight.  Pt has curlers in hair. Seems upset.  Eyes:  ? cataract in L eye. Fundoscopic exam otherwise unremarkable. Vision grossly intact.  Lungs:  work of breathing unlabored, clear to auscultation bilaterally; no wheezes, rales, or  ronchi; good air movement throughout  Heart:  regular rate and rhythm, no murmurs; normal s1/s2  Neurologic:  grossly intact. Face is symmetric. Normal gait. No motor weakness/deficit. Psych:  alert but upset. Perseverates on stating that the medicines are "not right." No obvious AVH. Communicates persistently during the exam. Is redirectable but has to be redirected frequently. Becomes more pleasant towards the end of the interview/exam.    Impression & Recommendations:  Problem # 1:  HYPERTENSION, ESSENTIAL, UNCONTROLLED (ICD-401.9) Assessment Deteriorated  Remains out of control due to non-adherence complicated by paranoia  and psychotic disorder. I have called the pt's daughter but could not get an answer. Asked her to call back to discuss our options to help [place]. I would like to admit the patient to the hospital to get things under control and see about psych intervention but she refuses this.  >25 minutes spent counseling and coordinating care for this patient.   Her updated medication list for this problem includes:    Hydrochlorothiazide 25 Mg Tabs (Hydrochlorothiazide) ..... One by mouth daily    Amlodipine Besylate 10 Mg Tabs (Amlodipine besylate) ..... One by mouth daily for blood pressure    Losartan Potassium 25 Mg Tabs (Losartan potassium) ..... One by mouth daily for blood pressure  Orders: [place]- Est  Level 4 (99214)  Problem # 2:  PSYCHIATRIC DISORDER (ICD-300.9) Assessment: Deteriorated  pyschotic d/o NOS. Exhibiting paranoia today. This is interfering with her adherence to medical therapy. She refuses behavioral health intervention and will not take medicines. Pt is not acutely a danger to herself or others so IVC is not appropriate but I fear that her medical comorbidities, if left uncontrolled, may have dire outcomes. I am at a loss as to how to help [place] at this point. I will be happy to continue to see her to provide encouragement and reinforcement but I cannot think of other options that have not already been explored. I hope that her daughter will call back so that I can discuss her case further.  It may be that the upcoming change of providers may be good as I don't feel that I've been very successful in helping Ms. Cloward. I advised that she could request to see her new doctor at her next appointment, but otherwise I would be happy to continue to see her.  Orders: [place]- Est  Level 4 [id number])  Complete Medication List: 1)  Hydrochlorothiazide 25 Mg Tabs (Hydrochlorothiazide) .... One by mouth daily 2)  Amlodipine Besylate 10 Mg Tabs (Amlodipine besylate) .... One by mouth daily for blood  pressure 3)  Metformin Hcl 1000 Mg Tabs (Metformin hcl) .... One by mouth two times a day 4)  Blood Glucose Test Strp (Glucose blood) .... Please check sugars three times a day; disp 1 month supply 5)  Relion Lancing Device Misc (Lancet devices) .... For checking sugars two times a day 6)  Relion Confirm Glucose Monitor W/device Kit (Blood glucose monitoring suppl) .... For checking sugars two times a day 7)  Relion Blood Glucose Test Strp (Glucose blood) .... For checking sugars two times a day 8)  Losartan Potassium 25 Mg Tabs (Losartan potassium) .... One by mouth daily for blood pressure 9)  Simvastatin 40 Mg Tabs (Simvastatin) .... One by mouth daily for cholesterol 10)  Amitriptyline Hcl 50 Mg Tabs (Amitriptyline hcl) .... One by mouth at bedtime for pain  Patient Instructions: 1)  My best advice is that you take your medicines. 2)  Your blood pressure is  dangerously high.  3)  You will have a new doctor here soon.  4)  Please follow-up in the next 1-3 weeks if you think that I can help you.

## 2011-01-24 NOTE — Assessment & Plan Note (Signed)
Summary: FU/KH   Vital Signs:  Patient profile:   55 year old female Height:      66 inches Weight:      197 pounds BMI:     31.91 BSA:     1.99 Temp:     97.8 degrees F Pulse rate:   101 / minute BP sitting:   176 / 107  Vitals Entered By: Christen Bame CMA (May 19, 2010 2:17 PM) CC: f/u Is Patient Diabetic? Yes Did you bring your meter with you today? No Pain Assessment Patient in pain? no        Primary Care Provider:  Eugenie Norrie  MD  CC:  f/u.  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 Pt persistently has issues with adhering to medication regimen, psych issues interplay in to this frequently. Refuses to be seen again at mental health  **interpreter present for entire interview and exam.   Today: Pt states that she's out of her medicines. Thinks they make her vision poor and sometimes dizzy so she hasn't gotten them refilled. Doesn't want to see an eye doctor because she "doesn't want another doctor."  1. Diabetes taking medications: no -- has been out for a few weeks; reports some difficulties with money and a having someone to help her pick them up. States that she thinks she'll be able to pick them up if I send them in again.  problems with medications?: thinks they make her dizzy blood sugar testing frequency: out of strips.  hypoglycemic events?: no  subjective: out of meds as above.   ROS chest pain: no   shortness of breath: no   polyuria:     polydipsia:     problems with feet:   2. Hypertension adherent to medications: not taking side effects from medications: thinks they make her dizzy subjective: BP elevated to 170s. Have had multiple values in this range previously.  ROS: vision changes:     reports blurry vision   1. R arm pain. Reports a history of arthritis. Hurts at night and with overhead activities. Won't permit much of an exam. Doesn't take anything  for pain.    Habits & Providers  Alcohol-Tobacco-Diet     Tobacco Status: never  Current Medications (verified): 1)  Hydrochlorothiazide 25 Mg Tabs (Hydrochlorothiazide) .... One By Mouth Daily 2)  Amlodipine Besylate 10 Mg Tabs (Amlodipine Besylate) .... One By Mouth Daily For Blood Pressure 3)  Metformin Hcl 1000 Mg Tabs (Metformin Hcl) .... One By Mouth Two Times A Day 4)  Blood Glucose Test  Strp (Glucose Blood) .... Please Check Sugars Three Times A Day; Disp 1 Month Supply 5)  Relion Lancing Device  Misc (Lancet Devices) .... For Checking Sugars Two Times A Day 6)  Relion Confirm Glucose Monitor W/device Kit (Blood Glucose Monitoring Suppl) .... For Checking Sugars Two Times A Day 7)  Relion Blood Glucose Test  Strp (Glucose Blood) .... For Checking Sugars Two Times A Day 8)  Losartan Potassium 25 Mg Tabs (Losartan Potassium) .... One By Mouth Daily For Blood Pressure 9)  Simvastatin 40 Mg Tabs (Simvastatin) .... One By Mouth Daily For Cholesterol 10)  Amitriptyline Hcl 50 Mg Tabs (Amitriptyline Hcl) .... One By Mouth At Bedtime For Pain  Allergies (verified): No Known Drug Allergies  Review of Systems       review of systems as noted in HPI section   Physical Exam  General:  vitals signs reviewed -- hypertensive, obese Alert, appropriate. No acute distress  Eyes:  PERRL, EOMI, fundoscopic exam unremarkable. Vision grossly normal.  Lungs:  work of breathing unlabored, clear to auscultation bilaterally; no wheezes, rales, or ronchi; good air movement throughout  Heart:  regular rate and rhythm, no murmurs; normal s1/s2  Msk:  + pain with Hawkin's, Empty can, apprehension testing. No bony abnormality. No gross tenderness to palpation. Won't permit me to do more of an exam because of the pain.    Impression & Recommendations:  Problem # 1:  DM (ICD-250.00) Assessment Deteriorated Not taking medicine. Adherence continues to be a big problem. Will check a1c and BMET today. I  am not optimistic that we will get good results. have asked pt to come with her daughter to her next visit. Dtr is able to provide more background but has only been somewhat helpful with the patient's issues.  I have called her today and left a VM for her to call us back.  Her updated medication list for this problem includes:    Metformin Hcl 1000 Mg Tabs (Metformin hcl) ..... One by mouth two times a day    Losartan Potassium 25 Mg Tabs (Losartan potassium) ..... One by mouth daily for blood pressure  Problem # 2:  HYPERTENSION, ESSENTIAL, UNCONTROLLED (ICD-401.9) Assessment: Deteriorated  poorly controlled but off meds. Asked pt to restart these. Her vision difficulties may be related to blood pressure. Fundoscopic exam is benign today. Pt will not go to an eye doctor so I'm not sure what to do from this point. Better BP control is important and I stressed this to the patient.  Her updated medication list for this problem includes:    Hydrochlorothiazide 25 Mg Tabs (Hydrochlorothiazide) ..... One by mouth daily    Amlodipine Besylate 10 Mg Tabs (Amlodipine besylate) ..... One by mouth daily for blood pressure    Losartan Potassium 25 Mg Tabs (Losartan potassium) ..... One by mouth daily for blood pressure  Orders: [place]- Est  Level 4 ([address]214)  Problem # 3:  ARM PAIN, RIGHT (ICD-729.5) Assessment: New  ? arthritis. + impingement on limited exam. Gave toradol x 1 dose in the office. Will try amitriptylene at night (would want to avoid extended NSAIDs given CAD risk, HTN) but pt doesn't seem convined that she will try it. Continue to follow.  Consider imaging.   Orders: Ketorolac-Toradol 15mg  [id number]) [place]- Est  Level 4 [id number])  Problem # 4:  PSYCHIATRIC DISORDER (ICD-300.9) Assessment: Unchanged Pt continues to display tangential thought with perseverations on her husband and various complaints of pain -- foot, arm etc. Will not be seen by mental health and has refused psych meds at this  point. Adherence is poor to meds anyway. Will continue to follow.   Complete Medication List: 1)  Hydrochlorothiazide 25 Mg Tabs (Hydrochlorothiazide) .... One by mouth daily 2)  Amlodipine Besylate 10 Mg Tabs (Amlodipine besylate) .... One by mouth daily for blood pressure 3)  Metformin Hcl 1000 Mg Tabs (Metformin hcl) .... One by mouth two times a day 4)  Blood Glucose Test Strp (Glucose blood) .... Please check sugars three times a day; disp 1 month supply 5)  Relion Lancing Device Misc (Lancet devices) .... For checking sugars two times a day 6)  Relion Confirm Glucose Monitor W/device Kit (Blood glucose monitoring suppl) .... For checking sugars two times a day 7)  Relion Blood Glucose Test Strp (Glucose blood) .... For checking sugars two times a  day 8)  Losartan Potassium 25 Mg Tabs (Losartan potassium) .... One by mouth daily for blood pressure 9)  Simvastatin 40 Mg Tabs (Simvastatin) .... One by mouth daily for cholesterol 10)  Amitriptyline Hcl 50 Mg Tabs (Amitriptyline hcl) .... One by mouth at bedtime for pain  Other Orders: A1C-[facility] [id number]) Basic Met-[facility] [id number])  Patient Instructions: 1)  I'm sending in all of your medicines to [facility]. Please restart your medicines as soon as possible. 2)  follow-up in 1-2 weeks to see how things are doing. Please try to bring your daughter in for your next visit so that we can make sure you aer getting what you need. 3)  Take the pain medicine (called amitriptylene) at night to help your shoulder. 4)  Call for any problems. Prescriptions: AMITRIPTYLINE HCL 50 MG TABS (AMITRIPTYLINE HCL) one by mouth at bedtime for pain  #30 x 0   Entered and Authorized by:   Eugenie Norrie  MD   Signed by:   Eugenie Norrie  MD on 05/19/2010   Method used:   Electronically to        [facility]  [phone]* (retail)       [address]       [place], [place]  [address]       Ph: [phone] or [phone]        Fax: [phone]   RxID:   [id number] SIMVASTATIN 40 MG TABS (SIMVASTATIN) one by mouth daily for cholesterol  #30 x 2   Entered and Authorized by:   Eugenie Norrie  MD   Signed by:   Eugenie Norrie  MD on 05/19/2010   Method used:   Electronically to        [facility]  [phone]* (retail)       [place], [place]  [address]       Ph: [phone] or [phone]       Fax: [phone]   [id number] LOSARTAN POTASSIUM 25 MG TABS (LOSARTAN POTASSIUM) one by mouth daily for blood pressure  #30 x 2   Entered and Authorized by:   Eugenie Norrie  MD   Signed by:   Eugenie Norrie  MD on 05/19/2010   Method used:   Electronically to        [facility]  [phone]* (retail)       [address]       [place], [place]  [address]       Ph: [phone] or [phone]       Fax: [phone]   [id number] BLOOD GLUCOSE TEST  STRP (GLUCOSE BLOOD) please check sugars three times a day; disp 1 month supply  #1 x 11   Entered and Authorized by:   Eugenie Norrie  MD   Signed by:   Eugenie Norrie  MD on 05/19/2010   Method used:   Electronically to        [facility]  [phone]* (retail)       [address]       [place], [place]  [address]       Ph: [phone] or [phone]       Fax: [phone]   RxID:   [id number] METFORMIN HCL 1000  MG TABS (METFORMIN HCL) one by mouth two times a day  #60 x 2   Entered and Authorized by:   Eugenie Norrie  MD   Signed by:   Eugenie Norrie  MD on 05/19/2010   Method used:   Electronically to        [facility]  [phone]* (retail)       [address]       [place], [place]  [address]       Ph: [phone] or [phone]       Fax: [phone]   RxID:   [id number] AMLODIPINE BESYLATE 10 MG TABS (AMLODIPINE BESYLATE) one by mouth daily for blood pressure  #30 x  2   Entered and Authorized by:   Eugenie Norrie  MD   Signed by:   Eugenie Norrie  MD on 05/19/2010   Method used:   Electronically to        [facility]  [phone]* (retail)       [address]       [place], [place]  [address]       Ph: [phone] or [phone]       Fax: [phone]   [id number] HYDROCHLOROTHIAZIDE 25 MG TABS (HYDROCHLOROTHIAZIDE) one by mouth daily  #30 x 2   Entered and Authorized by:   Eugenie Norrie  MD   Signed by:   Eugenie Norrie  MD on 05/19/2010   Method used:   Electronically to        [facility]  [phone]* (retail)       [address]       [place], [place]  [address]       Ph: [phone] or [phone]       Fax: [phone]   [id number]    Prevention & Chronic Care Immunizations   Influenza vaccine: Not documented    Tetanus booster: Not documented    Pneumococcal vaccine: Not documented  Colorectal Screening   Hemoccult: Not documented    Colonoscopy: Not documented  Other Screening   Pap smear: Not documented    Mammogram: Not documented   Smoking status: never  (05/19/2010)  Diabetes Mellitus   HgbA1C: 9.6  (01/04/2010)    Eye exam: Not documented    Foot exam: yes  (01/19/2010)   High risk foot: Not documented   Foot care education: Not documented    Urine microalbumin/creatinine ratio: Not documented    Diabetes flowsheet reviewed?: Yes   Progress toward A1C goal: Deteriorated  Lipids   Total Cholesterol: 237  (02/09/2010)   LDL: 170  (02/09/2010)   LDL Direct: 170  (08/09/2009)   HDL: 51  (02/09/2010)   Triglycerides: 78  (02/09/2010)    SGOT (AST): 15  (02/09/2010)   SGPT (ALT): 35  (02/09/2010)   Alkaline phosphatase: 92  (02/09/2010)   Total bilirubin: 0.5  (02/09/2010)    Lipid flowsheet reviewed?: Yes   Progress toward LDL goal: Deteriorated  Hypertension   Last Blood Pressure: 176 / 107   (05/19/2010)   Serum creatinine: 0.87  (02/09/2010)   Serum potassium 4.4  (02/09/2010)    Hypertension flowsheet reviewed?: Yes   Progress toward BP goal: Deteriorated  Self-Management Support :   Personal Goals (by the next clinic visit) :     Personal A1C  goal: 8  (09/01/2009)     Personal blood pressure goal: 140/90  (09/01/2009)     Personal LDL goal: 130  (03/23/2010)    Diabetes self-management support: Written self-care plan, Education handout  (09/22/2009)    Diabetes self-management support not done because: Not indicated  (05/19/2010)    Hypertension self-management support: Written self-care plan, Education handout  (09/22/2009)    Hypertension self-management support not done because: Refused  (05/19/2010)    Lipid self-management support: Written self-care plan  (09/22/2009)     Lipid self-management support not done because: Not indicated  (05/19/2010)   Medication Administration  Injection # 1:    Medication: Ketorolac-Toradol 15mg     Diagnosis: ARM PAIN, RIGHT (ICD-729.5)    Route: IM    Site: RUOQ gluteus    Exp Date: 06/25/2011    Lot #: [id number]    Mfr: Bynum Bellows    Comments: Patient recieved 30 mg of toradol    Patient tolerated injection without complications    Given by: Levert Feinstein LPN (May 26, 624THL [id number] PM)  Orders Added: 1)  A1C-[facility] [83036] 2)  Basic Met-[facility] [id number] 3)  Ketorolac-Toradol 15mg  [[id number]] 4)  [place]- Est  Level 4 [id number]  Appended Document: A1c  10.6 %    Lab Visit  Laboratory Results   Blood Tests   Date/Time Received: May 19, 2010 2:18 PM  Date/Time Reported: May 19, 2010 3:46 PM   HGBA1C: 10.6%   (Normal Range: Non-Diabetic - 3-6%   Control Diabetic - 6-8%)  Comments: ...............test performed by......Marland KitchenBonnie A. Martinique, MLS (ASCP)cm    Orders Today:

## 2011-01-24 NOTE — Progress Notes (Signed)
Summary: Rx Ques  Phone Note Call from Patient Call back at [facility] Phone [phone]   Caller: Daughter-[name] Summary of Call: Can she be put her on a lower dosage of Lisinopril due to all the coughing it is causing.   Initial call taken by: Raymond Gurney,  October 08, 2009 10:29 AM  Follow-up for Phone Call        message left to return call. Follow-up by: Marcell Barlow RN,  October 08, 2009 10:43 AM  Additional Follow-up for Phone Call Additional follow up Details #1::        attempted to call againand mesage left to return call. willsend message to MD. Additional Follow-up by: Marcell Barlow RN,  October 08, 2009 4:15 PM    Additional Follow-up for Phone Call Additional follow up Details #2::    can change to [facility] but pt will need an office visit to do this due to communication barrier. Daughter called and is agreeable to schedule visit in next 2-3 weeks. Follow-up by: Eugenie Norrie  MD,  October 11, 2009 12:39 PM

## 2011-01-24 NOTE — Progress Notes (Signed)
Summary: needs referral  Phone Note Call from Patient Call back at [phone]   Caller: [facility] Summary of Call: would als like to be referred to opthalmalogist and is having a persistant cough due to Lisinipril - pls advise  pls do not forget the rx for her dm supplies Initial call taken by: Audie Clear,  October 27, 2009 3:44 PM  Follow-up for Phone Call        Appt made for optho see order, pt dgt will call to schedule appt to talk about lisinopril, Rx for lancets and strips faxed to number in previous note. Follow-up by: Christen Bame CMA,  October 28, 2009 11:36 AM

## 2011-01-24 NOTE — Miscellaneous (Signed)
Summary: meds  Clinical Lists Changes spoke with dtr. states her mom is not co operative with home care nurse coming out to check on her bp. Pt not happy with requirement that she be homebound. 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  dtr states she was admitted to [facility] last night for bp & cbg.  160/78 yesterday.  186 cbg.  this am 297 today, but pt had already eaten.  dtr wants her admitted again. told her she needs to be seen before this. Also wants her on meds for schizophrenia. offered appt.  she wants pcp & declined a work in with wait. states she can only come today, not tomorrow. dtr was focused on 297 cbg today. I explained twice that if pt has already eaten, this was not too high. discussed diet & asked that she have her mom start a food diary as of today & bring with her to appt. offered appt with Dr. Jenne Campus or [facility]. explained fat has 9 cal per gram & other foods 4 cal. specifically no fried food, no skin on chicken, ease up on the mayo, read labels & learn portion control.  discussed sources of salt in diet & urged her to avoid as much as possible. gave examples.  dtr agreed to appt with pcp on friday. gave her red flags such as bad HA, blurry vision or motor changes. to go to ED if we are closed. told her about md on call after hours.  discussed blood sugars and end organ damage that can be prevented or slowed by good blood sugar control. states her mom likes all foods. she is concerned that her mom may have a cva or MI. told her we can get an interpretor that will be with her at the classes to help her understand and learn how to handle food & diabetes. she agreed with plan. Marland KitchenElige Radon RN  September 29, 2009 11:37 AM   spoke with pt's daughter. For some reason, the patient proceeded to the ED overnight "to be admitted." This was not the plan that we discussed. Seen in ED and sent home. I advised the daughter to have the patient return (asked daughter  to accompany the patient) and proceed to admitting this afternoon. Pt has a bed assignment on 6700. Daughter expresses agreement and will proceed accordingly. I have called the admitting office to inform them of her expected arrival. Left my pager with the admitting officer to call for any questions or concerns.  Eugenie Norrie  MD  September 29, 2009 12:37 PM   Observations: Added new observation of PRIMARY MD: Eugenie Norrie  MD (09/29/2009 11:10)

## 2011-01-24 NOTE — Assessment & Plan Note (Signed)
Summary: F/U LAST VISIT/EO   Vital Signs:  Patient profile:   55 year old female Height:      66 inches Weight:      198.6 pounds BMI:     32.17 Temp:     97.6 degrees F oral Pulse rate:   103 / minute BP sitting:   189 / 114  (left arm) Cuff size:   large  Vitals Entered By: Levert Feinstein LPN (September 29, 624THL 10:26 AM) CC: f/u bp and dm Is Patient Diabetic? Yes  Pain Assessment Patient in pain? yes     Location: lower abd   Serial Vital Signs/Assessments:  Comments: 10:32 AM 191/102-Manual BP By: Levert Feinstein LPN    Primary Care Provider:  Eugenie Norrie  MD  CC:  f/u bp and dm.  History of Present Illness: NOTE: visit performed with daughter present and interpreting  1. HTN Pt brings in HCTZ as prescribed. Pill count appears correct. Pt does not know her other BP meds. Does not have amlodipine or lisinopril with her today. BP very elevated on initial and recheck measurements. Daughter thinks she's taking her medicines as directed but does not observe her doing so.   2. diabetes Pt states that she's been taking glipizide without problems but has been out for  ~ 1 week. Has 2 refills left at the pharmacy but did not know these were ready. CBG greater than 300 today in clinic. Has not had an eye exam in some time. Complains of blurry vision.   Habits & Providers  Alcohol-Tobacco-Diet     Tobacco Status: never  Current Medications (verified): 1)  Glipizide 5 Mg Xr24h-Tab (Glipizide) .... One By Mouth Daily 2)  Hydrochlorothiazide 25 Mg Tabs (Hydrochlorothiazide) .... One By Mouth Daily 3)  Amlodipine Besylate 10 Mg Tabs (Amlodipine Besylate) .... One By Mouth Daily For Blood Pressure 4)  Metformin Hcl 500 Mg Tabs (Metformin Hcl) .... One By Mouth Daily For Diabetes; Take With Food  Allergies (verified): No Known Drug Allergies  Review of Systems       no chest pain, SOB, problems with walking.   Physical Exam  General:  General:  Vital signs reviewed --  obese but otherwise normal Alert, appropriate; well-dressed and well-nourished Lungs:  work of breathing unlabored, clear to auscultation bilaterally; no wheezes, rales, or ronchi; good air movement throughout Heart:  regular rate and rhythm, no murmurs; normal s1/s2 Pulses:  DP and radial pulses 2+ bilaterally  Extremities:  no cyanosis, clubbing, or edema Neurologic:  alert and oriented. speech normal. Grossly intact.  Psych:full affect, normally interactive. Good eye contact.     Impression & Recommendations:  Problem # 1:  HYPERTENSION, ESSENTIAL, UNCONTROLLED (ICD-401.9) Assessment Deteriorated Still poorly controlled. Rather than increase med regimen, will have home care out to house daily to observe and instruct daily x 1 week. Daughter agrees with this plan. Home health to call daughter so that pt will know when to look for the nurse (can't hear doorbell). follow-up in 2 weeks for BP check. No signs of end-organ damage at this time. If continues to be elevated, consider work-up for secondary causes. Reviewed low salt diet, other dietary measure. Handout given.  Her updated medication list for this problem includes:    Hydrochlorothiazide 25 Mg Tabs (Hydrochlorothiazide) ..... One by mouth daily    Amlodipine Besylate 10 Mg Tabs (Amlodipine besylate) ..... One by mouth daily for blood pressure  Orders: Home Health Referral ([place]) [facility]- Est  Level 4 [phone])  Problem # 2:  DM (ICD-250.00) Assessment: Unchanged Add metformin once a day. Glucose elevated and indicates poor control. Has refill at pharmacy. Pt may need continued home health to ensure access to meds and to help verify adherence.   Her updated medication list for this problem includes:    Glipizide 5 Mg Xr24h-tab (Glipizide) ..... One by mouth daily    Metformin Hcl 500 Mg Tabs (Metformin hcl) ..... One by mouth daily for diabetes; take with food  Orders: Glucose Cap-[facility] [id number]) Home Health Referral ([place]) [facility]- Est  Level 4 [id number])  Complete Medication List: 1)  Glipizide 5 Mg Xr24h-tab (Glipizide) .... One by mouth daily 2)  Hydrochlorothiazide 25 Mg Tabs (Hydrochlorothiazide) .... One by mouth daily 3)  Amlodipine Besylate 10 Mg Tabs (Amlodipine besylate) .... One by mouth daily for blood pressure 4)  Metformin Hcl 500 Mg Tabs (Metformin hcl) .... One by mouth daily for diabetes; take with food  Patient Instructions: 1)  Take the new medicine for diabetes once a day. Take with food. 2)  You should be taking 4 medicines in all. Please call with any questions. 3)  follow-up in 2 weeks for a blood pressure check here. 4)  follow-up with me in one month. 5)  very nice to see you both. Prescriptions: METFORMIN HCL 500 MG TABS (METFORMIN HCL) one by mouth daily for diabetes; take with food  #30 x 2   Entered and Authorized by:   Eugenie Norrie  MD   Signed by:   Eugenie Norrie  MD on 09/22/2009   Method used:   Electronically to        [facility]  [phone]* (retail)       [address]       [place],   [address]       Ph: [phone] or [phone]       Fax: [phone]   RxID:   [id number]    Prevention & Chronic Care Immunizations   Influenza vaccine: Not documented    Tetanus booster: Not documented    Pneumococcal vaccine: Not documented  Colorectal Screening   Hemoccult: Not documented    Colonoscopy: Not documented  Other Screening   Pap smear: Not documented    Mammogram: Not documented   Smoking status: never  (09/22/2009)  Diabetes Mellitus   HgbA1C: 10.5  (08/09/2009)    Eye exam: Not documented    Foot exam: Not documented   High risk foot: Not documented   Foot care education: Not documented    Urine microalbumin/creatinine ratio: Not documented    Diabetes flowsheet reviewed?: Yes   Progress toward A1C goal: Unchanged  Lipids   Total Cholesterol: Not documented   LDL: Not documented    LDL Direct: 170  (08/09/2009)   HDL: Not documented   Triglycerides: Not documented  Hypertension   Last Blood Pressure: 189 / 114  (09/22/2009)   Serum creatinine: 0.87  (08/09/2009)   Serum potassium 4.4  (08/09/2009)    Hypertension flowsheet reviewed?: Yes   Progress toward BP goal: Deteriorated  Self-Management Support :   Personal Goals (by the next clinic visit) :     Personal A1C goal: 8  (09/01/2009)     Personal blood pressure goal: 140/90  (09/01/2009)     Personal LDL goal: 100  (09/01/2009)    Diabetes self-management support: Written self-care plan, Education handout  (09/22/2009)   Diabetes care plan printed  Diabetes education handout printed    Hypertension self-management support: Written self-care plan, Education handout  (09/22/2009)   Hypertension self-care plan printed.   Hypertension education handout printed

## 2011-01-24 NOTE — Assessment & Plan Note (Signed)
Summary: hfu,df   Vital Signs:  Patient profile:   55 year old female Height:      66 inches Weight:      193 pounds BMI:     31.26 BSA:     1.97 Temp:     99.0 degrees F Pulse rate:   75 / minute BP sitting:   195 / 101  Vitals Entered By: Christen Bame CMA (January 19, 2010 11:17 AM) CC: hfu Is Patient Diabetic? Yes Pain Assessment Patient in pain? no        Serial Vital Signs/Assessments:  Comments: 11:18 AM [place] BP: 160/90 By: Christen Bame CMA    Primary Care Provider:  Eugenie Norrie  MD  CC:  hfu.  History of Present Illness: ** entire visit conducted with sign language interpreter.   1. foot swelling Admitted to hospital 01/07/10 for LE edema. Dopplers negative for DVT and negative d-dimer. Treated with Augmentin for questionable cellulitis.  From review of the discharge summary, there was never any erythema and the diagnosis of cellulitis was dubious at best. They did note a L 3rd toe ulcer that did not seem to be related to any swelling and also did not show any signs of infection and was, in fact, healing. Pt states that her leg feels much better. She has a few pills of the antibiotic left and plans on finishing them.  2. HTN Does not have her meds with her today. Can't name them. States she's taking them daily. Previously admitted to the hospital to see if medications were working and it was documented that they do effectively reduce her blood pressure into the normal range.   3. diabetes A1c earlier this month 9.6. On metformin daily. States that she's taking this medicine and trying to watch her diet.    Habits & Providers  Alcohol-Tobacco-Diet     Tobacco Status: never  Current Medications (verified): 1)  Hydrochlorothiazide 25 Mg Tabs (Hydrochlorothiazide) .... One By Mouth Daily 2)  Amlodipine Besylate 10 Mg Tabs (Amlodipine Besylate) .... One By Mouth Daily For Blood Pressure 3)  Metformin Hcl 500 Mg Tabs (Metformin Hcl) .... One By  Mouth Two Times A Day With Food For Diabetes 4)  Lisinopril 20 Mg Tabs (Lisinopril) .... One By Mouth Daily For Blood Pressure 5)  Blood Glucose Test  Strp (Glucose Blood) .... Please Check Sugars Three Times A Day; Disp 1 Month Supply 6)  Relion Lancing Device  Misc (Lancet Devices) .... For Checking Sugars Two Times A Day 7)  Relion Confirm Glucose Monitor W/device Kit (Blood Glucose Monitoring Suppl) .... For Checking Sugars Two Times A Day 8)  Relion Blood Glucose Test  Strp (Glucose Blood) .... For Checking Sugars Two Times A Day  Allergies (verified): No Known Drug Allergies  Review of Systems       The patient complains of peripheral edema.  The patient denies anorexia and weight loss.    Physical Exam  Additional Exam:  General:  Vital signs reviewed -- obese, hypertensive. Alert, oriented x 3. WD/WN Lungs:  work of breathing unlabored, clear to auscultation bilaterally; no wheezes, rales, or ronchi; good air movement throughout Heart:  regular rate and rhythm, no murmurs; normal s1/s2 Extremities:  trace BLE edema; no erythema, drainage, or discharge on either extremity. MSK: has a small 8 x 8 mm healing ulcer on plantar surface of 3rd left toe; no erythema, drainage, or discharge.  Neurologic:  alert and oriented. speech normal. Psych:  less tangential and labile  than previously. Requires frequent rediretion. Quite pleasant today.   Diabetes Management Exam:    Foot Exam (with socks and/or shoes not present):       Sensory-Pinprick/Light touch:          Left medial foot (L-4): normal          Left dorsal foot (L-5): normal          Left lateral foot (S-1): normal          Right medial foot (L-4): normal          Right dorsal foot (L-5): normal          Right lateral foot (S-1): normal       Sensory-Monofilament:          Left foot: normal          Right foot: normal       Inspection:          Left foot: abnormal             Comments: small 8 mm healing ulcer on  plantar surface of 3rd toe          Right foot: normal       Nails:          Left foot: normal          Right foot: normal   Impression & Recommendations:  Problem # 1:  LEG EDEMA (ICD-782.3)  Mild. Does not appear to be any evidence of cellulitis. Does have a distal 3rd toe ulcer that appears to be healing. Normal sensation on monofilament Good pulses.   Orders: [place]- Est  Level 4 (99214)  Problem # 2:  HYPERTENSION, ESSENTIAL, UNCONTROLLED (ICD-401.9)  We've verified that BP meds do, indeed, work during her hospitalization last year. Asked her to please bring all medicines with her when she comes back. Pt seems to recognize all the medicines on her med list when I show them to her.  Her updated medication list for this problem includes:    Hydrochlorothiazide 25 Mg Tabs (Hydrochlorothiazide) ..... One by mouth daily    Amlodipine Besylate 10 Mg Tabs (Amlodipine besylate) ..... One by mouth daily for blood pressure    Lisinopril 20 Mg Tabs (Lisinopril) ..... One by mouth daily for blood pressure  Orders: [place]- Est  Level 4 (99214)  Problem # 3:  DM (ICD-250.00) Assessment: Unchanged  A1c > 9 earlier this month. Emphasized the importance of controlling diabetes in order to facilitate complete wound healing of her 3rd toe ulcer.  Her updated medication list for this problem includes:    Metformin Hcl 500 Mg Tabs (Metformin hcl) ..... One by mouth two times a day with food for diabetes    Lisinopril 20 Mg Tabs (Lisinopril) ..... One by mouth daily for blood pressure  Orders: [place]- Est  Level 4 (99214)  Complete Medication List: 1)  Hydrochlorothiazide 25 Mg Tabs (Hydrochlorothiazide) .... One by mouth daily 2)  Amlodipine Besylate 10 Mg Tabs (Amlodipine besylate) .... One by mouth daily for blood pressure 3)  Metformin Hcl 500 Mg Tabs (Metformin hcl) .... One by mouth two times a day with food for diabetes 4)  Lisinopril 20 Mg Tabs (Lisinopril) .... One by mouth daily for blood  pressure 5)  Blood Glucose Test Strp (Glucose blood) .... Please check sugars three times a day; disp 1 month supply 6)  Relion Lancing Device Misc (Lancet devices) .... For checking sugars two times a day 7)  Relion  Confirm Glucose Monitor W/device Kit (Blood glucose monitoring suppl) .... For checking sugars two times a day 8)  Relion Blood Glucose Test Strp (Glucose blood) .... For checking sugars two times a day  Patient Instructions: 1)  Please take you blood pressure and diabetes medicines. 2)  You should be taking hydrochlorothiazide, amlodipine and lisinopril for blood pressure. 3)  You should be taking metformin for your diabetes. 4)  follow-up with me in 2-4 weeks.   Prevention & Chronic Care Immunizations   Influenza vaccine: Not documented    Tetanus booster: Not documented    Pneumococcal vaccine: Not documented  Colorectal Screening   Hemoccult: Not documented    Colonoscopy: Not documented  Other Screening   Pap smear: Not documented    Mammogram: Not documented   Smoking status: never  (01/19/2010)  Diabetes Mellitus   HgbA1C: 9.6  (01/04/2010)    Eye exam: Not documented    Foot exam: yes  (01/19/2010)   High risk foot: Not documented   Foot care education: Not documented    Urine microalbumin/creatinine ratio: Not documented    Diabetes flowsheet reviewed?: Yes   Progress toward A1C goal: Unchanged  Lipids   Total Cholesterol: Not documented   LDL: Not documented   LDL Direct: 170  (08/09/2009)   HDL: Not documented   Triglycerides: Not documented  Hypertension   Last Blood Pressure: 195 / 101  (01/19/2010)   Serum creatinine: 0.87  (08/09/2009)   Serum potassium 4.4  (08/09/2009)    Hypertension flowsheet reviewed?: Yes   Progress toward BP goal: Unchanged  Self-Management Support :   Personal Goals (by the next clinic visit) :     Personal A1C goal: 8  (09/01/2009)     Personal blood pressure goal: 140/90  (09/01/2009)      Personal LDL goal: 100  (09/01/2009)    Diabetes self-management support: Written self-care plan, Education handout  (09/22/2009)    Diabetes self-management support not done because: Refused  (01/04/2010)    Hypertension self-management support: Written self-care plan, Education handout  (09/22/2009)    Hypertension self-management support not done because: Refused  (01/04/2010)

## 2011-01-24 NOTE — Assessment & Plan Note (Signed)
Summary: FU/KH   Vital Signs:  Patient profile:   55 year old female Height:      66 inches Weight:      193.4 pounds BMI:     31.33 Temp:     97.9 degrees F oral Pulse rate:   106 / minute BP sitting:   168 / 85  (right arm) Cuff size:   large  Vitals Entered By: Levert Feinstein LPN (October 12, 624THL 10:15 AM) CC: hospital f/u Is Patient Diabetic? Yes  Pain Assessment Patient in pain? no        Primary Care Provider:  Eugenie Norrie  MD  CC:  hospital f/u.  History of Present Illness: **visit performed with sign interpreter  1. Diabetes Pt hospitalized 10/6-10/7 for controlled monitoring of previously uncontrolled sugars. Pt demonstrates increased understanding of diabetes and states that she is more committed to managing this issue after receiving diabetic teaching in the hospital. Taking metformin two times a day now with nor problems except occasional nausea. CBGs in meter are 89 to 160s for the most part. No hypoglycemic or numbers in the 200s since discharge  2. HTN pt unsure of meds but notes they are listed on her d/c sheet from the hospital. This indicates amlodipine, hctz and lisinopril. Upon administration of these meds in the hospital the pts BP was maintained in the [id number] to 0000000 systolic with monitored administration. BP elevated today.  3. psych Psych consult by Dr. Rhona Raider obtained in the hospital. No symptoms of psychosis at that time. Diagnosed with psychotic d/o NOS. Recommended zyprexa if pt has onset of pyschotic symptoms (had QTd of 466 msec on hospital)  Habits & Providers  Alcohol-Tobacco-Diet     Tobacco Status: never  Current Medications (verified): 1)  Hydrochlorothiazide 25 Mg Tabs (Hydrochlorothiazide) .... One By Mouth Daily 2)  Amlodipine Besylate 10 Mg Tabs (Amlodipine Besylate) .... One By Mouth Daily For Blood Pressure 3)  Metformin Hcl 500 Mg Tabs (Metformin Hcl) .... One By Mouth Two Times A Day With Food For Diabetes 4)  Lisinopril 20  Mg Tabs (Lisinopril) .... One By Mouth Daily For Blood Pressure  Allergies (verified): No Known Drug Allergies  Past History:  Past Medical History: HTN diabetes - longstanding per pt's daughter with history of nonadherence mental health issues (unknown type) - schizophrenia-spectrum with some paranoia per the pt's daughter;  pt will not sign ROI to get records  QTc of 466 10/10  uterine fibroids (endometrial biopsy not possible per OB-gyn)  DUB - endometrial biopsy deemed impossible by clinic at [facility].   Review of Systems       some nausea; denies HA, vision problems, chest pain or SOB.   Physical Exam  General:  Vital signs reviewed: overweight, hypertensive Alert and appropriate; well-dressed and well-nourished  Lungs:  work of breathing unlabored, clear to auscultation bilaterally; no wheezes, rales, or ronchi; good air movement throughout  Heart:  has II/VI SEM at LUSB. normal precordium; normal s1/s2. Pulses:  2+ dp and radial pulses Extremities:  no cyanosis, clubbing, or edema  Skin:  Intact without suspicious lesions or rashes Psych:  appears alert and oriented; continues to be tangential and emotionally labile with questioning. Requires frequent rediretion; pleasant. Pt perseverates on issues related to diabetes and the teaching that she received in the hospital. No active psychosis apparent, not responding to internal stimuli.    Impression & Recommendations:  Problem # 1:  DM (ICD-250.00) Improved. CBG of 179 today indicates progress. Pt seems  more interested in managing this issue. Continue current regimen. Still awaiting d/c summary from hospital.   The following medications were removed from the medication list:    Glipizide 5 Mg Xr24h-tab (Glipizide) ..... One by mouth daily Her updated medication list for this problem includes:    Metformin Hcl 500 Mg Tabs (Metformin hcl) ..... One by mouth two times a day with food for diabetes    Lisinopril 20 Mg Tabs  (Lisinopril) ..... One by mouth daily for blood pressure  Orders: [facility] [id number]) [place]- Est  Level 4 (99214)  Problem # 2:  HYPERTENSION, ESSENTIAL, UNCONTROLLED (ICD-401.9)  Still with poor control. Observation in hospital indicates that medicines do have desired effect so it seems that adherence is our issue here. Have called and left message on voicemail of [place] (in pts info from hospital). They are coming out Wed and Fridays to help with meds. Will try to make sure we have same med list as they do.  Her updated medication list for this problem includes:    Hydrochlorothiazide 25 Mg Tabs (Hydrochlorothiazide) ..... One by mouth daily    Amlodipine Besylate 10 Mg Tabs (Amlodipine besylate) ..... One by mouth daily for blood pressure    Lisinopril 20 Mg Tabs (Lisinopril) ..... One by mouth daily for blood pressure  Orders: [place]- Est  Level 4 (99214)  Problem # 3:  PSYCHIATRIC DISORDER (ICD-300.9)  pyschotic d/o NOS per Dr. Rhona Raider. Consider zyprexa if psychosis occurs. Pt had QTc of 466 in hospital.   Orders: [facility]- Est  Level 4 [id number])  Complete Medication List: 1)  Hydrochlorothiazide 25 Mg Tabs (Hydrochlorothiazide) .... One by mouth daily 2)  Amlodipine Besylate 10 Mg Tabs (Amlodipine besylate) .... One by mouth daily for blood pressure 3)  Metformin Hcl 500 Mg Tabs (Metformin hcl) .... One by mouth two times a day with food for diabetes 4)  Lisinopril 20 Mg Tabs (Lisinopril) .... One by mouth daily for blood pressure  Patient Instructions: 1)  Continue all your current medications 2)  follow-up with me in 2 months. 3)  You're doing a good job with your diabetes! Keep it up.  Prescriptions: METFORMIN HCL 500 MG TABS (METFORMIN HCL) one by mouth two times a day with food for diabetes  #60 x 2   Entered and Authorized by:   Eugenie Norrie  MD   Signed by:   Eugenie Norrie  MD on 10/05/2009   Method used:   Historical   [id number]   Appended Document: FU/KH spoke with [facility] nurse Colletta [place]) - her list of meds matches with ours. She is visiting pt to instruct on correct med administration and to continue diabetic teaching. Advised her that med adherence seems to be the biggest problem. To call our office with difficulties with adherence.

## 2011-01-24 NOTE — Progress Notes (Signed)
  Phone Note Other Incoming   Caller: Josh, Designer, jewellery Nurse Summary of Call: Nurse is concerned regarding elevated BP.  BP yesterday 250s/120s.  Pt was told to go to Urgent Care.  Lisinopril 20mg  daily was prescribed yesterday from Urgent Care.  Pt has not taken lisinopril.  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  Nurse is not sure if pt has taken her other meds.  [facility]N is there for compliance with meds.  No chest pain, dizziness, HA, weakness.  Husband is there, but he also is nonverbal.  Daughter is not at the house at the moment.  Daughter is [name] [phone].  Told nurse to look in pt's pill box.  All meds for Sunday gone.  There are 5 tablets in each section, 3 green, 1 hctz, 1 gray. Lisinopril is not yet in the pill box.  Nurse could not tell what meds pt is taking (green, gray).  Told nurse to inform pt that she should come to [facility] on Tues at 3pm to see Dr Sarita Haver.  He will go over her meds with her then.  Pt is to come with daughter. Today BP 200/100, random CBG 285 Initial call taken by: Cat Ta MD,  September 26, 2009 1:21 PM  Follow-up for Phone Call        [facility], [name] [phone].  She did not answer.  I left message for her to call [place] back and to bring [name] to [facility] on Tues 10/5 at 3pm for appt with Dr Sarita Haver. Follow-up by: Cat Ta MD,  September 26, 2009 1:32 PM  Additional Follow-up for Phone Call Additional follow up Details #1::        Spoke to [name].  She states that she cannot bring her mom to appt on 10/5 at 3pm because she has to work.  Told her to call [place] on Mon AM to schedule an appt for [place] AM to be seen by another physician.  Told her to bring all the meds and pill box to this appt.  She states that she understands.   We will need to go over meds with daughter and pt.  Additional Follow-up by: Cat Ta MD,  September 26, 2009 1:55 PM      Appended Document:  appt with Dr. Jeannine Kitten at 10:30. dtr cannot bring her in pm

## 2011-01-24 NOTE — Assessment & Plan Note (Signed)
Summary: bp check & meds/[place]/Linday Rhodes   Vital Signs:  Patient profile:   55 year old female Height:      66 inches Weight:      196 pounds BMI:     31.75 BSA:     1.98 Temp:     98.6 degrees F Pulse rate:   99 / minute BP sitting:   172 / 94  Vitals Entered By: Christen Bame CMA (September 28, 2009 10:37 AM) CC: uncontrolled BP Is Patient Diabetic? Yes  Pain Assessment Patient in pain? no        Primary Care Provider:  Eugenie Norrie  MD  CC:  uncontrolled BP.  History of Present Illness: 1. BP Pt recently seen at UC and lisinopril added to med regimen. [facility] has been out to house and has measured pt's blood pressure > [id number] mmHg systolic on more than one occasion. Have had a hard time verifying whether pt has been taking medications. Pt's daughter states that pt gets "worked up" and paranoid when the home health nurses are there and she thinks this may be contributing to adherence issues with med regimen. Daughter does not know whether pt has been taking lisinopril but "thinks" she's been taking HCTZ and amlodipine.  2. diabetes Pt has refused to take CBGs at home per the daughter. Dtr thinks she's taking glipizide and metformin but not sure. CBG 299 here in the office today. Pt's A1c 10.5 8/16. Has a history of nonadherence to diabetes regimens.  3. anxiety Pt's daughter explains that patient gets "worked up" with [place]. Has a history of mental health problems -- dtr thinks it may be schizophrenia. Pt has previously refused to sign the ROI consent for records from mental health because she doesn't want them to give [place] an unfair or bad report. Pt is currently off all mental health medications.   Habits & Providers  Alcohol-Tobacco-Diet     Tobacco Status: never  Current Medications (verified): 1)  Glipizide 5 Mg Xr24h-Tab (Glipizide) .... One By Mouth Daily 2)  Hydrochlorothiazide 25 Mg Tabs (Hydrochlorothiazide) .... One By Mouth Daily 3)  Amlodipine Besylate 10  Mg Tabs (Amlodipine Besylate) .... One By Mouth Daily For Blood Pressure 4)  Metformin Hcl 500 Mg Tabs (Metformin Hcl) .... One By Mouth Daily For Diabetes; Take With Food 5)  Lisinopril 20 Mg Tabs (Lisinopril) .... One By Mouth Daily For Blood Pressure  Allergies (verified): No Known Drug Allergies  Past History:  Family History: Last updated: 09/01/2009 no family history of HTN, diabetes, mental health problems, bleeding or clotting disorders, or cancer per the patient's daughter  Social History: Last updated: 09/28/2009 Lives with husband who is apparently also non-verbal. Has a daughter who can help with medicines and directions [name] [phone]) but is sometimes overwhelmed by the patient's issues.   Past Medical History: HTN diabetes - longstanding per pt's daughter with history of nonadherence mental health issues (unknown type) - schizophrenia-spectrum with some paranoia per the pt's daughter;  pt will not sign ROI to get records  uterine fibroids (endometrial biopsy not possible per OB-gyn)  DUB - endometrial biopsy deemed impossible by clinic at [facility].   Past Surgical History: s/p D&C 2008 ago for bleeding from fibroids.   Ultrasound 7/10 showing multiple fibroids and endometrial stripe of 15 mm  Surgery is being considered as endometriali biopsy has been deemed impossible by [facility] clinic but is currently deferred until diabetes and HTN are controlled.   Social History: Lives  with husband who is apparently also non-verbal. Has a daughter who can help with medicines and directions [name] [phone]) but is sometimes overwhelmed by the patient's issues.   Review of Systems       + for cough and abnormal uterine bleeding. ROS is o/w negative except as already noted.   Physical Exam  General:  General:  Vital signs reviewed -- overweight, hypertensive Alert, appropriate; well-dressed and well-nourished Lungs:  work of breathing unlabored, clear to  auscultation bilaterally; no wheezes, rales, or ronchi; good air movement throughout Heart:  regular rate and rhythm, no murmurs; normal s1/s2 Pulses:  DP and radial pulses 2+ bilaterally  Extremities:  no cyanosis, clubbing, or edema Neurologic:  alert and oriented. speech normal. Psych: appears somewhat anxious and paranoid; affect is full. Pt's daughter communicates for the patient. No obvious psychosis.     Impression & Recommendations:  Problem # 1:  HYPERTENSION, ESSENTIAL, UNCONTROLLED (ICD-401.9)  Still with poor control BPs consistently high per home health. Still having trouble confirming adherence. Given pt's complex situation with her disabilities and lack of resources, I feel it is in the patient's best interest to admit her to the hospital so that we may verify medication administration and therepeutic effect. Moreover, the patient needs a hysterectomy and this cannot proceed until her BP is at least somewhat controlled. I am concerned that the patient may have a secondary cause for hypertension but cannot verify that it is refractory to medications at this point. Pt and daughter are agreeable to this plan. Discussed with faculty -- Dr. McDiarmid and Dr. Erin Hearing.   Her updated medication list for this problem includes:    Hydrochlorothiazide 25 Mg Tabs (Hydrochlorothiazide) ..... One by mouth daily    Amlodipine Besylate 10 Mg Tabs (Amlodipine besylate) ..... One by mouth daily for blood pressure    Lisinopril 20 Mg Tabs (Lisinopril) ..... One by mouth daily for blood pressure  Orders: [place]- Est  Level 4 (99214)  Problem # 2:  DM (ICD-250.00)  CBG 299 -- again uncontrolled. Will admit and order consult for diabetic teaching. Will not place on SSI at this time as pt will likely not tolerate this as an outpatient and tight control is not our goal. A1c 10.5 in August.   Orders: [facility]ucose [facility] [id number])  Problem # 3:  PSYCHIATRIC DISORDER (ICD-300.9)  likely schizophrenia.  Pt is not on any antipsychotics and likely would benefit from them. She has demonstrated symptoms of paranoia in my interactions with her and will benefit from a psychiatry consult. We will pursue this in the inpatient setting. Pt has had a bad experience with mental health in the past so we should try to proceed slowly with any intervention.   Orders: [place]- Est  Level 4 [id number])  Problem # 4:  proph Assessment: Unchanged Lovenox  Problem # 5:  dispo SW consult, case management consult, diabetic teaching consult, psychiatry consult; follow BP on therapy and follow cbgs daily. Consider w/u for secondary hypertension.   Problem # 6:  FIBROIDS, UTERUS (ICD-218.9) followed by women's clinic. considering surgery but not until BP better controlled. endometrial biopsy could not be performed.   Complete Medication List: 1)  Glipizide 5 Mg Xr24h-tab (Glipizide) .... One by mouth daily 2)  Hydrochlorothiazide 25 Mg Tabs (Hydrochlorothiazide) .... One by mouth daily 3)  Amlodipine Besylate 10 Mg Tabs (Amlodipine besylate) .... One by mouth daily for blood pressure 4)  Metformin Hcl 500 Mg Tabs (Metformin hcl) .... One by mouth daily  for diabetes; take with food 5)  Lisinopril 20 Mg Tabs (Lisinopril) .... One by mouth daily for blood pressure  Patient Instructions: 1)  Your medicines for blood pressure are: 2)  1. Hydrochlorothiazide 25 mg - one pill a day 3)  2. Amlodipine 10 mg - one pill a day 4)  3. Lisinopril 20 mg - one pill a day 5)  Your medicines for diabetes are: 6)  1. Glipizide 5 mg - one pill a day 7)  2. Metformin 500 mg - one pill a day. 8)  You should be taking 5 pills once a day. You can take these all at the same time.  9)  I think we need to admit you to the hospital so that we can be sure of your medicines, to see if they are working, and so that we could look for other problems if needed.  10)  Go to admitting here at [facility] tomorrow morning to be [facility] to the [facility]. We'll make sure everything is set up.

## 2011-01-24 NOTE — Progress Notes (Signed)
Summary: meds prob  Phone Note Call from Patient Call back at Home Phone [phone]   Caller: daughter-Bonita Summary of Call: Glipizide has a different millagrams on it and is not sure which one should be taken, 10mg  or 5mg ?? Where were the meds called in? Initial call taken by: Audie Clear,  September 22, 2009 1:37 PM  Follow-up for Phone Call        verified dose & changed phamacy & sent refills there Follow-up by: Elige Radon RN,  September 22, 2009 1:47 PM

## 2011-01-24 NOTE — Progress Notes (Signed)
Summary: phn msg  Phone Note Call from Patient Call back at [phone]   Caller: Daughter-Bonita Chambers Summary of Call: wanted to let Dr. Sarita Haver know that they will be going to hospital as soon as she gets her son off the bus.   Initial call taken by: Raymond Gurney,  September 29, 2009 2:48 PM

## 2011-01-24 NOTE — Progress Notes (Signed)
Summary: triage  Phone Note Call from Patient Call back at [phone]   Caller: Daughter-Benita Chambers Summary of Call: Daughter concerned about the medication she is taking.  It is causing her to cough very much.  Lisinopril she believes is the medication. Initial call taken by: Raymond Gurney,  February 22, 2010 11:17 AM  Follow-up for Phone Call        uses [facility] on [address].  states she has had a bad cough ever since beginning Lisinopril. told her some people do have this side effect. to pcp to change. told her I will call her when done Follow-up by: Elige Radon RN,  February 22, 2010 11:23 AM  Additional Follow-up for Phone Call Additional follow up Details #1::        given communication difficulties with the patient, will need an office visit to make this change. This is not an emergency (has been going on for some time) and she should not discontinue her medication until appointment. Please make appointment and please call pt to advise. thanks.  Additional Follow-up by: Eugenie Norrie  MD,  February 23, 2010 8:38 AM    Additional Follow-up for Phone Call Additional follow up Details #2::    spoke with dtr. appt nect mon with pcp at 11:15. to continue taking the med Follow-up by: Elige Radon RN,  February 23, 2010 9:38 AM

## 2011-01-24 NOTE — Progress Notes (Signed)
Summary: needs orders stat  Phone Note From Other Clinic Call back at [phone]   Caller: [facility] Summary of Call: needs glucometer supplies - strips and lancets - needs it on a script pad - diagnosis and how often - what meds she takes pls fax to [phone] pt is completely out - needs it stat Initial call taken by: Audie Clear,  October 27, 2009 9:29 AM  Follow-up for Phone Call        to pcp Follow-up by: Elige Radon RN,  October 27, 2009 9:37 AM  Additional Follow-up for Phone Call Additional follow up Details #1::        can you Janett Billow or Rudi Rummage, write this out to give to patient? Otherwise, I'll write for it tomorrow. Pt will be okay without testing her sugar for a day or two. Please advise that we need a few days notice in the future for any meds or supplies.  Additional Follow-up by: Eugenie Norrie  MD,  October 27, 2009 9:39 AM    Additional Follow-up for Phone Call Additional follow up Details #2::    since this is needed on an Rx pad I called and LMOVM of [facility] that we would fax tomorrow and to call back with questions Follow-up by: Christen Bame CMA,  October 27, 2009 9:58 AM  Additional Follow-up for Phone Call Additional follow up Details #3:: Details for Additional Follow-up Action Taken: f [phone]. they need this today. pt has not been able to check cbgs in a few days Additional Follow-up by: Elige Radon RN,  October 27, 2009 10:10 AM  checked with Dr. Erin Hearing. he declined to write. states pcp can handle tomorrow. message back to [facility] that we will fax tomorrow.Elige Radon RN  October 27, 2009 10:17 AM  Appended Document: needs orders stat    Clinical Lists Changes  Medications: Added new medication of BLOOD GLUCOSE TEST  STRP (GLUCOSE BLOOD) please check sugars three times a day; disp 1 month supply - Signed Added new medication of LANCETS  MISC (LANCETS) lancets for blood sugar testing three times a day; disp 1 month's supply -  Signed Rx of BLOOD GLUCOSE TEST  STRP (GLUCOSE BLOOD) please check sugars three times a day; disp 1 month supply;  #1 x 11;  Signed;  Entered by: Eugenie Norrie  MD;  Authorized by: Eugenie Norrie  MD;  Method used: Print then Give to Patient Rx of LANCETS  MISC (LANCETS) lancets for blood sugar testing three times a day; disp 1 month's supply;  #1 x 11;  Signed;  Entered by: Eugenie Norrie  MD;  Authorized by: Eugenie Norrie  MD;  Method used: Print then Give to Patient    Prescriptions: LANCETS  MISC (LANCETS) lancets for blood sugar testing three times a day; disp 1 month's supply  #1 x 11   Entered and Authorized by:   Eugenie Norrie  MD   Signed by:   Eugenie Norrie  MD on 10/28/2009   Method used:   Print then Give to Patient   RxIDMW:[id number] BLOOD GLUCOSE TEST  STRP (GLUCOSE BLOOD) please check sugars three times a day; disp 1 month supply  #1 x 11   Entered and Authorized by:   Eugenie Norrie  MD   Signed by:   Eugenie Norrie  MD on 10/28/2009   Method used:   Print then Give to Patient   RxID:   [id number]

## 2011-01-24 NOTE — Consult Note (Signed)
Summary: [place]   Imported By: Raymond Gurney 12/09/2009 10:55:25  _____________________________________________________________________  External Attachment:    Type:   Image     Comment:   External Document

## 2011-01-24 NOTE — Assessment & Plan Note (Signed)
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 She frequently exhibits paranoia and has been diagnosed with psychotic d/o NOS during hospital stay within the past year. She is tangetial and perseverative during her visits. She apparently has had a bad experience with mental health in [place] in the past and refuses to discuss mental issues for fear that she will be sent back there. Her paranoia, communication issues, financial woes and lack of family support make treating her conditions which are quite significant, very challenging.  I've looked into options for monitoring adherence to meds but HH can't go out for more than 4 weeks at a time and community resources are either lacking or too expensive.  Daugther has tried to help some but is overwhelmed with her own issues.   Very challenging patient to see. Developing trust is paramount. Realizing your own limitations is also important.   Problem # 2:  PSYCHIATRIC DISORDER (ICD-300.9) pt will not consent to treatemnt at this point.   Problem # 3:  HYPERTENSION, ESSENTIAL, UNCONTROLLED (ICD-401.9) Usually has BP is the 160s (very improved from our initial visits). Has periods of adherence and complete non-adherence depending on her mental state and external factors. Had cough with ACEi so has been switched to an ARB  Her updated medication list for this problem includes:    Hydrochlorothiazide 25 Mg Tabs (Hydrochlorothiazide) ..... One by mouth daily    Amlodipine Besylate 10 Mg Tabs (Amlodipine besylate) ..... One by mouth daily for blood pressure    Losartan Potassium 25 Mg Tabs (Losartan potassium) ..... One by mouth daily for blood pressure  Problem # 4:  DM (ICD-250.00) Has shown some modest improvement since being seen here. On metformin. Recently stopped all meds and I'm not sure if she's been adherent since our last visit.   Her updated medication  list for this problem includes:    Metformin Hcl 1000 Mg Tabs (Metformin hcl) ..... One by mouth two times a day    Losartan Potassium 25 Mg Tabs (Losartan potassium) ..... One by mouth daily for blood pressure  Labs Reviewed: Creat: 0.75 (05/19/2010)    Reviewed HgBA1c results: 10.6 (05/19/2010)  9.6 (01/04/2010)  Problem # 5:  FIBROIDS, UTERUS (ICD-218.9) Endometrial biopsy deemed impossible by [facility] clinics. Suboptimal D&C performed 12/08 with benign pathology. Hysterectomy had been scheduled but cancelled as the patient's symptoms improved after D&C.  Complete Medication List: 1)  Hydrochlorothiazide 25 Mg Tabs (Hydrochlorothiazide) .... One by mouth daily 2)  Amlodipine Besylate 10 Mg Tabs (Amlodipine besylate) .... One by mouth daily for blood pressure 3)  Metformin Hcl 1000 Mg Tabs (Metformin hcl) .... One by mouth two times a day 4)  Blood Glucose Test Strp (Glucose blood) .... Please check sugars three times a day; disp 1 month supply 5)  Relion Lancing Device Misc (Lancet devices) .... For checking sugars two times a day 6)  Relion Confirm Glucose Monitor W/device Kit (Blood glucose monitoring suppl) .... For checking sugars two times a day 7)  Relion Blood Glucose Test Strp (Glucose blood) .... For checking sugars two times a day 8)  Losartan Potassium 25 Mg Tabs (Losartan potassium) .... One by mouth daily for blood pressure 9)  Simvastatin 40 Mg Tabs (Simvastatin) .... One by mouth daily for cholesterol 10)  Amitriptyline Hcl 50 Mg Tabs (Amitriptyline hcl) .... One by mouth at bedtime for pain    Allergies: No Known  Drug Allergies   Past History:  Past Medical History: HTN - poorly controlled diabetes - longstanding per pt's daughter with history of nonadherence mental health issues (unknown type) - schizophrenia-spectrum with some paranoia per the pt's daughter;  pt will not sign ROI to get records  QTc of 466 10/10  uterine fibroids (endometrial biopsy not  possible per OB-gyn) Suboptimal D&C performed 12/08 with benign pathology. Hysterectomy had been scheduled but cancelled as the patient's symptoms improved after D&C.

## 2011-01-24 NOTE — Progress Notes (Signed)
Summary: phn msg  Phone Note Other Incoming Call back at [phone] ext [phone]   Caller: [place] Summary of Call: Calling about request for services that have already been provided for social worker.  This was done a month ago and her ins will not pay for this.  Please call her back. Initial call taken by: Raymond Gurney,  December 06, 2009 9:36 AM  Follow-up for Phone Call        [place] with Jackelyn Poling @ [facility].  Social worker already been out previously.  Notes sts that pt is idependent and fully capable of taking meds as directed.  Therefore they cannot send out a Education officer, museum again.  Advised I would send a message to MD. Follow-up by: Christen Bame CMA,  December 06, 2009 9:49 AM  Additional Follow-up for Phone Call Additional follow up Details #1::        Medicare will not pay for services continuously. Will try to contact [employer]; also will try [employer]. [phone], [phone] Additional Follow-up by: Eugenie Norrie  MD,  December 06, 2009 12:16 PM

## 2011-01-24 NOTE — Progress Notes (Signed)
Summary: triage  Phone Note Call from Patient Call back at Home Phone [phone]   Caller: Doroteo Bradford Summary of Call: daughter stating that mom can not afford to pick new meter or strips or anything.  The amount is 64.81.  Pt is very upset that rx's are so much. Initial call taken by: Raymond Gurney,  December 13, 2009 1:38 PM  Follow-up for Phone Call        spoke with dtr. states she is very frustrated about costs. mom cannot afford this & she can't buy them for her. gave numbers to [place] [facility] to see if she can be helped Follow-up by: Elige Radon RN,  December 13, 2009 1:44 PM  Additional Follow-up for Phone Call Additional follow up Details #1::        spoke with [place] from [facility]. needs order to restart [facility] Additional Follow-up by: Elige Radon RN,  December 16, 2009 11:01 AM    Additional Follow-up for Phone Call Additional follow up Details #2::    intake from [facility] states medicare will not pay for social workers or monitoring. must have a skilld need & teaching done. it would have to be short term. as of now, they cannot help this pt Follow-up by: Elige Radon RN,  December 16, 2009 11:30 AM   Appended Document: triage called [facility] about their cheapest glucose monitor. It is Relion. Ordered these and called the daughter. She expresses understanding.   Clinical Lists Changes  Medications: Changed medication from PRODIGY BLOOD GLUCOSE TEST  STRP (GLUCOSE BLOOD) for checking sugars two times a day ; disp 1 box to RELION BLOOD GLUCOSE TEST  STRP (GLUCOSE BLOOD) for checking sugars two times a day - Signed Changed medication from PRODIGY BLOOD GLUCOSE MONITOR  DEVI (BLOOD GLUCOSE MONITORING SUPPL) for checking sugars two times a day to [place] W/DEVICE KIT (BLOOD GLUCOSE MONITORING SUPPL) for checking sugars two times a day - Signed Changed medication from PRODIGY TWIST TOP LANCETS 28G  MISC (LANCETS) for checking sugars two times a day; disp QS  for 3 months to RELION LANCING DEVICE  MISC (LANCET DEVICES) for checking sugars two times a day - Signed Rx of RELION BLOOD GLUCOSE TEST  STRP (GLUCOSE BLOOD) for checking sugars two times a day;  #200 x 11;  Signed;  Entered by: Eugenie Norrie  MD;  Authorized by: Eugenie Norrie  MD;  Method used: Electronically to [facility]  [phone]*, [address] [place], [place], [place]  [address], Ph: [phone] or [id number], Fax: [phone] Rx of [place] W/DEVICE KIT (BLOOD GLUCOSE MONITORING SUPPL) for checking sugars two times a day;  #1 x 0;  Signed;  Entered by: Eugenie Norrie  MD;  Authorized by: Eugenie Norrie  MD;  Method used: Electronically to [facility]  [phone]*, [address], [place], [place], [place]  [address], Ph: [phone] or [id number], Fax: [phone] Rx of RELION LANCING DEVICE  MISC (LANCET DEVICES) for checking sugars two times a day;  #200 x 11;  Signed;  Entered by: Eugenie Norrie  MD;  Authorized by: Eugenie Norrie  MD;  Method used: Electronically to [facility]  [phone]*, [address] [place], [place], [place]  [address], Ph: [phone] or [id number], Fax: [phone] Observations: Added new observation of PRIMARY MD: Eugenie Norrie  MD (12/21/2009 13:29)    Prescriptions: RELION LANCING DEVICE  MISC (LANCET DEVICES) for checking sugars two times a day  #200 x  11   Entered and Authorized by:   Eugenie Norrie  MD   Signed by:   Eugenie Norrie  MD on 12/21/2009   Method used:   Electronically to        [facility]  [phone]* (retail)       [address]       [place], [place]  [address]       Ph: [phone] or [id number]       Fax: [phone]   RxID:   [id number] RELION CONFIRM GLUCOSE MONITOR W/DEVICE KIT (BLOOD GLUCOSE MONITORING SUPPL) for checking sugars two times a day  #1 x 0   Entered and Authorized by:   Eugenie Norrie   MD   Signed by:   Eugenie Norrie  MD on 12/21/2009   Method used:   Electronically to        [facility]  [phone]* (retail)       [address]       [place], [place]  [address]       Ph: [phone] or [id number]       Fax: [phone]   RxID:   [id number] RELION BLOOD GLUCOSE TEST  STRP (GLUCOSE BLOOD) for checking sugars two times a day  #200 x 11   Entered and Authorized by:   Eugenie Norrie  MD   Signed by:   Eugenie Norrie  MD on 12/21/2009   Method used:   Electronically to        [facility]  [phone]* (retail)       [address]       [place], [place]  [address]       Ph: [phone] or [id number]       Fax: [phone]   RxID:   [id number]

## 2011-01-24 NOTE — Assessment & Plan Note (Signed)
Summary: f/up,tcb   Vital Signs:  Patient profile:   55 year old female Height:      66 inches Weight:      188.7 pounds BMI:     30.57 Temp:     98.6 degrees F oral Pulse rate:   91 / minute BP sitting:   175 / 104  (right arm) Cuff size:   large  Vitals Entered By: Levert Feinstein LPN (December 10, 624THL 10:27 AM) CC: f/u bp Is Patient Diabetic? Yes Did you bring your meter with you today? No   Serial Vital Signs/Assessments:  Comments: 10:27 AM Manual BP: 170/94 By: Levert Feinstein LPN    Primary Care Provider:  Eugenie Norrie  MD  CC:  f/u bp.  History of Present Illness: Interview/exam conducted with sign language interpreter  1. HTN Has run out of blood pressure medicine and not been taking for the past month or so. HH has stopped coming out to the house. Reports not problems with the medicines.   ROS: no chest pain, SOB, no cough. No problems eating/drinking.   2. diabetes Last a1c was 10.5 8/16. Not taking metformin. Wants to "be natural." Has been trying to stay active and eat healthier. Open to restarted this medication when discussing health risks of uncontrolled diabetes.   3. 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 Has had a bad experience with mental health in the past and repeatedly states that she does not want to go back there. Recommended consideration of zyprexa for the onset of psychotic symptoms.   ** note: pt is very difficult to interview given her communication difficulty and tendency to be tangential with questioning. Her psych issues likely interplay with this. She spends a good portion of every interview stating that she doesn't want to go back to mental health.  003 Current Medications (verified): 1)  Hydrochlorothiazide 25 Mg Tabs (Hydrochlorothiazide) .... One By Mouth Daily 2)  Amlodipine Besylate 10 Mg Tabs (Amlodipine Besylate) .... One By Mouth Daily For Blood Pressure 3)  Metformin Hcl 500 Mg  Tabs (Metformin Hcl) .... One By Mouth Two Times A Day With Food For Diabetes 4)  Lisinopril 20 Mg Tabs (Lisinopril) .... One By Mouth Daily For Blood Pressure 5)  Blood Glucose Test  Strp (Glucose Blood) .... Please Check Sugars Three Times A Day; Disp 1 Month Supply 6)  Lancets  Misc (Lancets) .... Lancets For Blood Sugar Testing Three Times A Day; Disp 1 Month's Supply  Allergies (verified): No Known Drug Allergies  Past History:  Past Medical History: Last updated: 10/05/2009 HTN diabetes - longstanding per pt's daughter with history of nonadherence mental health issues (unknown type) - schizophrenia-spectrum with some paranoia per the pt's daughter;  pt will not sign ROI to get records  QTc of 466 10/10  uterine fibroids (endometrial biopsy not possible per OB-gyn)  DUB - endometrial biopsy deemed impossible by clinic at [facility].   Past Surgical History: Last updated: 09/28/2009 s/p D&C 2008 ago for bleeding from fibroids.   Ultrasound 7/10 showing multiple fibroids and endometrial stripe of 15 mm  Surgery is being considered as endometriali biopsy has been deemed impossible by [facility] clinic but is currently deferred until diabetes and HTN are controlled.   Family History: Last updated: 09/01/2009 no family history of HTN, diabetes, mental health problems, bleeding or clotting disorders, or cancer per the patient's daughter  Social History: Last updated: 12/03/2009 Lives with husband who is apparently also  non-verbal. Has a daughter who can help with medicines and directions [name] [phone]) but is sometimes overwhelmed by the patient's issues.   HH had previously been going out to the house but has since d/c'd the patient. HHN Stephanie [phone]).   Social History: Lives with husband who is apparently also non-verbal. Has a daughter who can help with medicines and directions [name] [phone]) but is sometimes overwhelmed by the patient's issues.   HH  had previously been going out to the house but has since d/c'd the patient. HHN Stephanie [phone]).   Physical Exam  General:  Vital signs reviewed: overweight, hypertensive Alert and appropriate; well-dressed and well-nourished  Lungs:  work of breathing unlabored, clear to auscultation bilaterally; no wheezes, rales, or ronchi; good air movement throughout  Heart:  has II/VI SEM at LUSB. normal precordium; normal s1/s2. Extremities:  no cyanosis, clubbing, or edema  Psych:  appears alert and oriented; continues to be tangential and emotionally labile with questioning. Requires frequent rediretion; pleasant. Pt perseverates on issues related to diabetes and the teaching that she received in the hospital. No active psychosis apparent, not responding to internal stimuli.    Impression & Recommendations:  Problem # 1:  HYPERTENSION, ESSENTIAL, UNCONTROLLED (ICD-401.9) Assessment Deteriorated spoke with HH. They stated pt had been d/c'd from services about one month ago. I stated that this really was not acceptable given the pt's poor health literacy and communication disabilities. They will try to see if they can get her more services and get back in touch with me.   pt agreeablel to restart meds. Asked her to have someone call when/if she runs out. Will try to get [facility] to go back out to the house. Will also try to get SW consult to see if patient can get some kind of case manager or other resources. Tried to stress the [place]-term effects of uncontrolled disease  >25 minutes spent counseling and coordinating care for this patient.   Her updated medication list for this problem includes:    Hydrochlorothiazide 25 Mg Tabs (Hydrochlorothiazide) ..... One by mouth daily    Amlodipine Besylate 10 Mg Tabs (Amlodipine besylate) ..... One by mouth daily for blood pressure    Lisinopril 20 Mg Tabs (Lisinopril) ..... One by mouth daily for blood pressure  Orders: [place]- Est  Level 4 ([address])  Problem  # 2:  DM (ICD-250.00) Assessment: Deteriorated  off meds. Pt agreeable to restart. Tried to stress the [place]-term effects of uncontrolled disease. Pt staets that she does want to try to take care of her self. follow-up in one month.  Her updated medication list for this problem includes:    Metformin Hcl 500 Mg Tabs (Metformin hcl) ..... One by mouth two times a day with food for diabetes    Lisinopril 20 Mg Tabs (Lisinopril) ..... One by mouth daily for blood pressure  Orders: [place]- Est  Level 4 ([address]214)  Problem # 3:  PSYCHIATRIC DISORDER (ICD-300.9) Assessment: Unchanged  NOS. Think that this merits further eval but pt seems to be terrified of going back to mental health. SEems to have capacity. Will continue to follow. Difficult to get the full story of pt without family here. Will try to get SW to see pt.   Orders: [place]- Est  Level 4 [id number])  Complete Medication List: 1)  Hydrochlorothiazide 25 Mg Tabs (Hydrochlorothiazide) .... One by mouth daily 2)  Amlodipine Besylate 10 Mg Tabs (Amlodipine besylate) .... One by mouth daily for blood pressure 3)  Metformin Hcl 500 Mg Tabs (Metformin hcl) .... One by mouth two times a day with food for diabetes 4)  Lisinopril 20 Mg Tabs (Lisinopril) .... One by mouth daily for blood pressure 5)  Blood Glucose Test Strp (Glucose blood) .... Please check sugars three times a day; disp 1 month supply 6)  Lancets Misc (Lancets) .... Lancets for blood sugar testing three times a day; disp 1 month's supply  Other Orders: Home Health Referral ([place])  Patient Instructions: 1)  start your medicines again. If you want to stay healthy, it's important for you to take these medicines.  2)  You are on 3 medicines for blood pressure: amlodipine, hydrochlorothaizide, and lisinopril. Take each once  a day 3)  You are on 1 medicine for diabetes: take the metformin two times a day  4)  follow-up with me in one  month Prescriptions: LANCETS  MISC (LANCETS)  lancets for blood sugar testing three times a day; disp 1 month's supply  #1 x 11   Entered and Authorized by:   Eugenie Norrie  MD   Signed by:   Eugenie Norrie  MD on 12/03/2009   Method used:   Electronically to        [facility]  [phone]* (retail)       [address]       [place], [place]  [address]       Ph: [phone] or [phone]       Fax: [phone]   RxIDHT:[id number] BLOOD GLUCOSE TEST  STRP (GLUCOSE BLOOD) please check sugars three times a day; disp 1 month supply  #1 x 11   Entered and Authorized by:   Eugenie Norrie  MD   Signed by:   Eugenie Norrie  MD on 12/03/2009   Method used:   Electronically to        [facility]  [phone]* (retail)       [place], [place]  [address]       Ph: [phone] or [phone]       Fax: [phone]   RxID:   [id number] LISINOPRIL 20 MG TABS (LISINOPRIL) one by mouth daily for blood pressure  #30 x 2   Entered and Authorized by:   Eugenie Norrie  MD   Signed by:   Eugenie Norrie  MD on 12/03/2009   Method used:   Electronically to        [facility]  [phone]* (retail)       [place], [place]  [address]       Ph: [phone] or [phone]       Fax: [phone]   RxID:   [id number] METFORMIN HCL 500 MG TABS (METFORMIN HCL) one by mouth two times a day with food for diabetes  #60 x 2   Entered and Authorized by:   Eugenie Norrie  MD   Signed by:   Eugenie Norrie  MD on 12/03/2009   Method used:   Electronically to        [facility]  [phone]* (retail)       [address]       [place], [place]  [address]  Ph: [phone] or [phone]       Fax: [phone]   [id number] AMLODIPINE BESYLATE 10 MG TABS (AMLODIPINE BESYLATE) one by mouth daily for blood pressure  #30 x 2   Entered and Authorized by:   Eugenie Norrie  MD   Signed by:   Eugenie Norrie  MD on 12/03/2009   Method used:   Electronically to        [facility]  [phone]* (retail)       [address]       [place], [place]  [address]       Ph: [phone] or [phone]       Fax: [phone]   RxID:   [id number] HYDROCHLOROTHIAZIDE 25 MG TABS (HYDROCHLOROTHIAZIDE) one by mouth daily  #30 x 2   Entered and Authorized by:   Eugenie Norrie  MD   Signed by:   Eugenie Norrie  MD on 12/03/2009   Method used:   Electronically to        [facility]  [phone]* (retail)       [address]       [place], [place]  [address]       Ph: [phone] or [phone]       Fax: [phone]   RxID:   [id number]    Prevention & Chronic Care Immunizations   Influenza vaccine: Not documented    Tetanus booster: Not documented    Pneumococcal vaccine: Not documented  Colorectal Screening   Hemoccult: Not documented    Colonoscopy: Not documented  Other Screening   Pap smear: Not documented    Mammogram: Not documented   Smoking status: never  (10/05/2009)  Diabetes Mellitus   HgbA1C: 10.5  (08/09/2009)    Eye exam: Not documented    Foot exam: Not documented   High risk foot: Not documented   Foot care education: Not documented    Urine microalbumin/creatinine ratio: Not documented    Diabetes flowsheet reviewed?: Yes   Progress toward A1C goal: Unchanged  Lipids   Total Cholesterol: Not documented   LDL: Not documented   LDL Direct: 170  (08/09/2009)   HDL: Not documented   Triglycerides: Not documented  Hypertension   Last Blood Pressure: 175 / 104  (12/03/2009)   Serum creatinine: 0.87  (08/09/2009)   Serum potassium 4.4  (08/09/2009)    Hypertension flowsheet reviewed?: Yes   Progress toward BP goal: Deteriorated  Self-Management Support :   Personal Goals (by the next clinic visit) :     Personal A1C goal: 8   (09/01/2009)     Personal blood pressure goal: 140/90  (09/01/2009)     Personal LDL goal: 100  (09/01/2009)    Diabetes self-management support: Written self-care plan, Education handout  (09/22/2009)    Hypertension self-management support: Written self-care plan, Education handout  (09/22/2009)

## 2011-01-24 NOTE — Progress Notes (Signed)
Summary: meds prob  Phone Note Call from Patient Call back at Home Phone [phone]   Caller: daughter-Benita Summary of Call: states that [facility] doesn't have the strips or lancets at that pharmacy - needs to find out what meter it is, got it from [facility]  Initial call taken by: Audie Clear,  December 07, 2009 2:20 PM  Follow-up for Phone Call        left message for Colletta [place] from [facility] & dtr to call me back Follow-up by: Elige Radon RN,  December 07, 2009 2:26 PM  Additional Follow-up for Phone Call Additional follow up Details #1::        spoke with dtr. uses [facility] on [address].  her mom is hearing impaired so we depend on dtr for info. told her I had a call into [place] with [facility]. will call [facility] to see if they know the name of the meter. if so, I will order refills.  [facility] not [facility] of meter Additional Follow-up by: Elige Radon RN,  December 08, 2009 10:16 AM    Additional Follow-up for Phone Call Additional follow up Details #2::    dtr states her mom is at her house & told her the name of the meter is strips. told her I do not recognize this name of a meter. suggested they go get the meter & call drug store or take the meter to pharmacy & have them add it to their records so they will not have this issue again. she agreed. Follow-up by: Elige Radon RN,  December 08, 2009 4:06 PM   Appended Document: meds prob meter name is [employer] and lancer is Biochemist, clinical.

## 2011-01-24 NOTE — Assessment & Plan Note (Signed)
Summary: f/u eo   Vital Signs:  Patient profile:   55 year old female Height:      66 inches Weight:      196.5 pounds BMI:     31.83 Temp:     98.0 degrees F oral Pulse rate:   112 / minute BP sitting:   206 / 94  (left arm) Cuff size:   regular  Vitals Entered By: Levert Feinstein LPN (January 11, 624THL 2:29 PM)  Serial Vital Signs/Assessments:  Comments: 2:30 PM Manual BP: 192/92 By: Levert Feinstein LPN   CC: f/u Is Patient Diabetic? Yes Did you bring your meter with you today? No   Primary Care Provider:  Eugenie Norrie  MD  CC:  f/u.  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 After last visit I tried to get the patient set up with community services that may help her take her medicines consistently. Daughter never called me back to say whether this had been set up.  1. HTN Not taking BP meds consistently. States she doesn't want to keep coming here because she doesn't know what good it's doing.   ROS: no chest pain, SOB, no cough. No problems eating/drinking.   2. diabetes Poorly controlled. A1c over 9 today. Pt not taking her diabetes medicines consistently.  3. 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 Has had a bad experience with mental health in the past and repeatedly states that she does not want to go back there again. Today she talks about not getting along with her husband and perserverates on this issue. States she doesn't want any kind of medicine that might help her deal with stress and problems at home.   Denies any SI/HI. Recommended consideration of zyprexa for the onset of psychotic symptoms.   ** note: pt continues to be very difficult to interview given her communication difficulty and tendency to be  tangential with questioning. She is more animated and labile today which severely interferes with my ability to obtain an adequate history and exam.  Habits & Providers  Alcohol-Tobacco-Diet     Tobacco Status: never  Current Medications (verified): 1)  Hydrochlorothiazide 25 Mg Tabs (Hydrochlorothiazide) .... One By Mouth Daily 2)  Amlodipine Besylate 10 Mg Tabs (Amlodipine Besylate) .... One By Mouth Daily For Blood Pressure 3)  Metformin Hcl 500 Mg Tabs (Metformin Hcl) .... One By Mouth Two Times A Day With Food For Diabetes 4)  Lisinopril 20 Mg Tabs (Lisinopril) .... One By Mouth Daily For Blood Pressure 5)  Blood Glucose Test  Strp (Glucose Blood) .... Please Check Sugars Three Times A Day; Disp 1 Month Supply 6)  Relion Lancing Device  Misc (Lancet Devices) .... For Checking Sugars Two Times A Day 7)  Relion Confirm Glucose Monitor W/device Kit (Blood Glucose Monitoring Suppl) .... For Checking Sugars Two Times A Day 8)  Relion Blood Glucose Test  Strp (Glucose Blood) .... For Checking Sugars Two Times A Day  Allergies (verified): No Known Drug Allergies  Past History:  Past Medical History: Reviewed history from 10/05/2009 and no changes required. HTN diabetes - longstanding per pt's daughter with history of nonadherence mental health issues (unknown type) - schizophrenia-spectrum with some paranoia per the pt's daughter;  pt will not sign  ROI to get records  QTc of 466 10/10  uterine fibroids (endometrial biopsy not possible per OB-gyn)  DUB - endometrial biopsy deemed impossible by clinic at [facility].   Past Surgical History: Reviewed history from 09/28/2009 and no changes required. s/p D&C 2008 ago for bleeding from fibroids.   Ultrasound 7/10 showing multiple fibroids and endometrial stripe of 15 mm  Surgery is being considered as endometriali biopsy has been deemed impossible by [facility] clinic but is currently deferred until diabetes and HTN are controlled.    Physical Exam  Additional Exam:  General:  Vital signs reviewed -- obese, hypertensive. Alert, oriented x 3. WD/WN Lungs:  work of breathing unlabored, clear to auscultation bilaterally; no wheezes, rales, or ronchi; good air movement throughout Heart:  regular rate and rhythm, no murmurs; normal s1/s2 Extremities:  no cyanosis, clubbing, or edema Neurologic:  alert and oriented. speech normal. Psych:  continues to be tangential and emotionally labile (even more than usual) with questioning. Requires frequent rediretion. Agitated and confrontational today. Pt perseverates on issues related to her husband. States multiple times that she doesn't want to go back to mental health. Denies SI or HI.    Impression & Recommendations:  Problem # 1:  HYPERTENSION, ESSENTIAL, UNCONTROLLED (ICD-401.9) Assessment Deteriorated  Uncontrolled. Pt does not want to take her medicine. I advised her that if she did not, she could have a heart attack or stroke or something else bad may happen. We have admitted the pt to the hospital to see if observed therapy worked and it did. I have tried to get community resources for her (cannot get any more HH services). I don't know what else I have to offer her. She must participate with therapy for this to improve.  Her updated medication list for this problem includes:    Hydrochlorothiazide 25 Mg Tabs (Hydrochlorothiazide) ..... One by mouth daily    Amlodipine Besylate 10 Mg Tabs (Amlodipine besylate) ..... One by mouth daily for blood pressure    Lisinopril 20 Mg Tabs (Lisinopril) ..... One by mouth daily for blood pressure  Orders: [place]- Est Level  3 ([address])  Problem # 2:  DM (ICD-250.00) Assessment: Unchanged Uncontrolled. A1c over 9.  Her updated medication list for this problem includes:    Metformin Hcl 500 Mg Tabs (Metformin hcl) ..... One by mouth two times a day with food for diabetes    Lisinopril 20 Mg Tabs (Lisinopril) ..... One by mouth daily for  blood pressure  Orders: A1C-[facility] [id number]) [place]- Est Level  3 [id number])  Problem # 3:  PSYCHIATRIC DISORDER (ICD-300.9) Assessment: Deteriorated  Unknown diagnosis. Pt has paranoid symptoms and today is agitated. Not an imminent threat to self or others. I think she would benefit from an antipsychotic and close follow-up with psychiatry but she refuses. This issue impedes her attention to other health issues. Will do the best we can.  Orders: [place]- Est Level  3 [id number])  Complete Medication List: 1)  Hydrochlorothiazide 25 Mg Tabs (Hydrochlorothiazide) .... One by mouth daily 2)  Amlodipine Besylate 10 Mg Tabs (Amlodipine besylate) .... One by mouth daily for blood pressure 3)  Metformin Hcl 500 Mg Tabs (Metformin hcl) .... One by mouth two times a day with food for diabetes 4)  Lisinopril 20 Mg Tabs (Lisinopril) .... One by mouth daily for blood pressure 5)  Blood Glucose Test Strp (Glucose blood) .... Please check sugars three times a day; disp 1 month supply 6)  Relion Lancing Device Misc (Lancet devices) .Marland KitchenMarland KitchenMarland Kitchen  For checking sugars two times a day 7)  Relion Confirm Glucose Monitor W/device Kit (Blood glucose monitoring suppl) .... For checking sugars two times a day 8)  Relion Blood Glucose Test Strp (Glucose blood) .... For checking sugars two times a day  Patient Instructions: 1)  Please take your diabetes and blood pressure medications. These problems are not under control and if they continue like this, they will not get better.  2)  Please have your daughter come to the next visit. 3)  follow-up in 1-2 months or earlier if needed.  Laboratory Results   Blood Tests   Date/Time Received: January 04, 2010 2:19 PM  Date/Time Reported: January 04, 2010 2:32 PM   HGBA1C: 9.6%   (Normal Range: Non-Diabetic - 3-6%   Control Diabetic - 6-8%)  Comments: ...............test performed by......Marland KitchenBonnie A. Martinique, MLS (ASCP)cm      Prevention & Chronic Care Immunizations   Influenza  vaccine: Not documented    Tetanus booster: Not documented    Pneumococcal vaccine: Not documented  Colorectal Screening   Hemoccult: Not documented    Colonoscopy: Not documented  Other Screening   Pap smear: Not documented    Mammogram: Not documented   Smoking status: never  (01/04/2010)  Diabetes Mellitus   HgbA1C: 9.6  (01/04/2010)    Eye exam: Not documented    Foot exam: Not documented   High risk foot: Not documented   Foot care education: Not documented    Urine microalbumin/creatinine ratio: Not documented    Diabetes flowsheet reviewed?: Yes   Progress toward A1C goal: Unchanged  Lipids   Total Cholesterol: Not documented   LDL: Not documented   LDL Direct: 170  (08/09/2009)   HDL: Not documented   Triglycerides: Not documented  Hypertension   Last Blood Pressure: 206 / 94  (01/04/2010)   Serum creatinine: 0.87  (08/09/2009)   Serum potassium 4.4  (08/09/2009)    Hypertension flowsheet reviewed?: Yes   Progress toward BP goal: Deteriorated  Self-Management Support :   Personal Goals (by the next clinic visit) :     Personal A1C goal: 8  (09/01/2009)     Personal blood pressure goal: 140/90  (09/01/2009)     Personal LDL goal: 100  (09/01/2009)    Diabetes self-management support: Written self-care plan, Education handout  (09/22/2009)    Diabetes self-management support not done because: Refused  (01/04/2010)    Hypertension self-management support: Written self-care plan, Education handout  (09/22/2009)    Hypertension self-management support not done because: Refused  (01/04/2010)  Appended Document: R2 Addendum See handwritten note in chart.  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  N fever, no injuries.  Has not taken any pain meds or abx.  Please se R1 note for full details.  PE:  R  foot, swollen diffusely from foot to calf.  > mildy warmer but no significant erythema.  No point tenderness.  No joint swelling.  Shallow ulcer on platar surface or 3rd toe. with some serous drainage  no decreased sensation to light touch, pinprick, or proprioception of toes.  A/P  55 yo with R LE swelling 1.)  R. LE swelling:  normal xrays.  No evidence of deep infectoin, abscess.  Will treat for diabetic foot ulcer with Augmentin.  Given no impressive erythema and swelling extending intocalf, cannot attribute  all of this to toe ulcer.  Will get Doppler to r/o DVT. 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 3.) DM:  will increase metformin to 1000 mg two times a day.4.) HTN:  Continue home meds 5.) Dispo:  if leg improving on augmentin and no DVT, may D/C tomorrow with outpatient treatment for cellulitis.  Suzanna Obey MD  January 08, 2010 11:35 AM  [Prescriptions]

## 2011-01-24 NOTE — Assessment & Plan Note (Signed)
Summary: f/u bp/eo   Vital Signs:  Patient profile:   55 year old female Height:      66 inches Weight:      196 pounds BMI:     31.75 BSA:     1.98 Temp:     98.0 degrees F Pulse rate:   99 / minute BP sitting:   195 / 115  Vitals Entered By: Christen Bame CMA (June 17, 2010 8:42 AM) CC: f/u DM and BP Is Patient Diabetic? Yes Pain Assessment Patient in pain? no        Primary Care Provider:  Eugenie Norrie  MD  CC:  f/u DM and BP.  History of Present Illness: **interpreter present for entire interview and exam.   55 yo female with pschotic d/o NOS, deaf/mutism, poorly controlled HTN and DM2 here for follow-up. Pt persistently has issues with adhering to medication regimen, psych issues interplay in to this frequently. Refuses to be seen again at mental health   1. BP Pt states that she will not take any medicines. She thinks that her blood pressure medicine causes problems with her vision and they are "too much." She wants to "go natural." Thinks that her vision is worse WITH her medicines.  2. diabetes  States that the metformin is "too much." Want to focus on watching her diet and checking her sugars (is out of testing supplies). Is not taking any medicine at all.   ROS:  reports some increased thirst and urinary frequency. Not eating more than usual. Denies pain.    Habits & Providers  Alcohol-Tobacco-Diet     Tobacco Status: never  Current Medications (verified): 1)  Hydrochlorothiazide 25 Mg Tabs (Hydrochlorothiazide) .... One By Mouth Daily 2)  Amlodipine Besylate 10 Mg Tabs (Amlodipine Besylate) .... One By Mouth Daily For Blood Pressure 3)  Metformin Hcl 1000 Mg Tabs (Metformin Hcl) .... One By Mouth Two Times A Day 4)  Blood Glucose Test  Strp (Glucose Blood) .... Please Check Sugars Three Times A Day; Disp 1 Month Supply 5)  Relion Lancing Device  Misc (Lancet Devices) .... For Checking Sugars Two Times A Day 6)  Relion Confirm Glucose Monitor  W/device Kit (Blood Glucose Monitoring Suppl) .... For Checking Sugars Two Times A Day 7)  Relion Blood Glucose Test  Strp (Glucose Blood) .... For Checking Sugars Two Times A Day 8)  Losartan Potassium 25 Mg Tabs (Losartan Potassium) .... One By Mouth Daily For Blood Pressure 9)  Simvastatin 40 Mg Tabs (Simvastatin) .... One By Mouth Daily For Cholesterol 10)  Amitriptyline Hcl 50 Mg Tabs (Amitriptyline Hcl) .... One By Mouth At Bedtime For Pain  Allergies (verified): No Known Drug Allergies  Review of Systems       review of systems as noted in HPI section   Physical Exam  General:  vitals signs reviewed -- hypertensive, overweight.  less agitated, but resolute about her wishes Eyes:  fundoscopic exam unremarkable but exam limited due to equipment . Mouth:  oropharynx pink, moist; no erythema or exudate  Lungs:  work of breathing unlabored, clear to auscultation bilaterally; no wheezes, rales, or ronchi; good air movement throughout  Heart:  regular rate and rhythm, no murmurs; normal s1/s2  Abdomen:  soft, no distention, no masses, and no guarding.   Extremities:  1+ BLE pitting edema.  Neurologic:  grossly intact. Face is symmetric. Normal gait. No motor weakness/deficit. Skin:  warm, good turgor; no rashes or lesions. brisk cap refill  Psych:  Alert.  Perseverates on stating that the medicines are "not right." No obvious AVH. Communicates persistently during the exam. Is redirectable but has to be redirected frequently. Pleasant today but obstinant about not wanting to take medicines.    Impression & Recommendations:  Problem # 1:  HYPERTENSION, ESSENTIAL, UNCONTROLLED (ICD-401.9)  uncontrolled. Not taking any medicines. I reiterated that I thought this problem needed medicine and that I was worried that something bad might happen if she didn't take her medicine. She is, unfortunately, resolved to "go natural." I am worried that she will have a bad outcome.   Orders: [place]- Est  Level  3 [id number])  Problem # 2:  DM (ICD-250.00)  Not taking any medicine. Would like to check her blood sugars, however. Refilled these supplies. No overt signs of acidosis. check BMET and random glucose.    Orders: [facility] [phone]) [facility]- Est Level  3 [id number])  Labs Reviewed: Creat: 0.75 (05/19/2010)    Reviewed HgBA1c results: 10.6 (05/19/2010)  9.6 (01/04/2010)  Complete Medication List: 1)  Hydrochlorothiazide 25 Mg Tabs (Hydrochlorothiazide) .... One by mouth daily 2)  Amlodipine Besylate 10 Mg Tabs (Amlodipine besylate) .... One by mouth daily for blood pressure 3)  Metformin Hcl 1000 Mg Tabs (Metformin hcl) .... One by mouth two times a day 4)  Blood Glucose Test Strp (Glucose blood) .... Please check sugars three times a day; disp 1 month supply 5)  Relion Lancing Device Misc (Lancet devices) .... For checking sugars two times a day 6)  Relion Confirm Glucose Monitor W/device Kit (Blood glucose monitoring suppl) .... For checking sugars two times a day 7)  Relion Blood Glucose Test Strp (Glucose blood) .... For checking sugars two times a day 8)  Losartan Potassium 25 Mg Tabs (Losartan potassium) .... One by mouth daily for blood pressure 9)  Simvastatin 40 Mg Tabs (Simvastatin) .... One by mouth daily for cholesterol 10)  Amitriptyline Hcl 50 Mg Tabs (Amitriptyline hcl) .... One by mouth at bedtime for pain  Patient Instructions: 1)  check your blood sugars two times a day  2)  Again, I think something bad may happen to you (like a heart attack or stroke) if you don't take you medicine. But it's your decision to take it or not.  3)  follow-up here in 1-2 months to meet your new doctor (Dr. Tye Savoy) Prescriptions: RELION BLOOD GLUCOSE TEST  STRP (GLUCOSE BLOOD) for checking sugars two times a day  #200 x 11   Entered and Authorized by:   Eugenie Norrie  MD   Signed by:   Eugenie Norrie  MD on 06/17/2010   Method used:   Electronically to        [facility]  [phone]* (retail)       [address]       [place], [place]  [address]       Ph: [phone] or [phone]       Fax: [phone]   [id number] RELION LANCING DEVICE  MISC (LANCET DEVICES) for checking sugars two times a day  #200 x 11   Entered and Authorized by:   Eugenie Norrie  MD   Signed by:   Eugenie Norrie  MD on 06/17/2010   Method used:   Electronically to        [facility]  [phone]* (retail)       [address]  [place], [place]  [address]       Ph: [phone] or [phone]       Fax: [phone]   RxID:   [id number] RELION CONFIRM GLUCOSE MONITOR W/DEVICE KIT (BLOOD GLUCOSE MONITORING SUPPL) for checking sugars two times a day  #1 x 0   Entered and Authorized by:   Eugenie Norrie  MD   Signed by:   Eugenie Norrie  MD on 06/17/2010   Method used:   Electronically to        [facility]  [phone]* (retail)       [address]       [place], [place]  [address]       Ph: [phone] or [phone]       Fax: [phone]   RxID:   [id number]
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
## 2011-01-24 NOTE — Progress Notes (Signed)
Summary: [facility]  Phone Note Outgoing Call   Call placed by: Eugenie Norrie  MD,  December 07, 2009 9:29 AM Summary of Call: Spoke with patient's daughter, [name]. Told her that I have spoken with [facility]. They have a nurse that can go out to pt's house, fill up pill box, remind her to take her medicine. Cost would be about $15/hr based on their income. I told her that I thought this would be helpful, even if it was only once every 2 weeks (i.e. $30 per month). Bonita agreed. I gave her the number for [facility] and asked her to give them a call. She will. Asked her to call me back to let me know how it goes. She agreed.  Initial call taken by: Eugenie Norrie  MD,  December 07, 2009 9:31 AM

## 2011-01-24 NOTE — Assessment & Plan Note (Signed)
Summary: F/U BP & BLOOD SUGAR/[facility]   Vital Signs:  Patient profile:   55 year old female Height:      66 inches Weight:      202 pounds BMI:     32.72 BSA:     2.01 Temp:     97.5 degrees F Pulse rate:   109 / minute BP sitting:   175 / 104  Vitals Entered By: Christen Bame, CMA (September 01, 2009 9:41 AM) CC: routine f/u Pain Assessment Patient in pain? no        Primary Care Provider:  Eugenie Norrie  MD  CC:  routine f/u.  History of Present Illness: NOTE: visit performed with sign language interpreter  1. HTN Pt states that she's been taking lisinopril and HCTZ as prescribed. Some occasional dizziness but side effects have been tolerable. States she took her medicine today.   2. diabetes Pt states that she's been taking glipizide without problems. States that she thinks portion control is one of her problems with diabetes. Knows that she needs to eat less.  3. Mental health Pt refuses to sign ROI to get records from mental health because she had a bad experience there and doesn't want any "bad stuff" on her record here. Had been on mental health meds previously but is not taking any currently.  NOTE: spoke with pt's daughter [name] [phone]) after the visit. She states that her mother has been diagnosed with diabetes for years but has been non-adherent with treating it. Also has had longstanding hypertension but has not consistently treated that as well. -  Habits & Providers  Alcohol-Tobacco-Diet     Tobacco Status: never  Current Medications (verified): 1)  Glipizide 5 Mg Xr24h-Tab (Glipizide) .... One By Mouth Daily 2)  Hydrochlorothiazide 25 Mg Tabs (Hydrochlorothiazide) .... One By Mouth Daily 3)  Amlodipine Besylate 10 Mg Tabs (Amlodipine Besylate) .... 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   Past Surgical History: s/p D&C 2 years ago for bleeding from fibroids.   Family History: no family history of HTN, diabetes, mental health problems, bleeding or clotting disorders, or cancer per the patient's daughter  Social History: Lives with husband. Has a daughter who can help with medicines and directions [name] [phone]) but is sometimes overwhelmed by the patient's issues. Smoking Status:  never  Review of Systems       denies chest pain, shortness of breath, polydipsia. Endorses polyuria on HCTZ.   Physical Exam  General:  Vital signs reviewed: overweight, hypertensive, tachycardic Alert and appropriate; well-dressed and well-nourished  Neck:  no JVD, no carotid bruits Lungs:  work of breathing unlabored, clear to auscultation bilaterally; no wheezes, rales, or ronchi; good air movement throughout  Heart:  has II/VI SEM at LUSB. normal precordium; normal s1/s2. Abdomen:  +BS, soft, non-tender, non-distended; no masses; no rebound or guarding; no renal artery bruits bilaterally. Pulses:  2+ dp and radial pulses Extremities:  no cyanosis, clubbing, or edema  Neurologic:  alert and oriented. speech normal. station and gait normal. no gross deficitis.  Psych:  appears alert and  oriented; seems tangential and emotionally labile with questioning. Requires rediretion; pleasant. Pt perseverates on bad experience with mental health. No active psychosis apparent, not responding to internal stimuli.    Impression & Recommendations:  Problem # 1:  HYPERTENSION, ESSENTIAL, UNCONTROLLED (ICD-401.9) Assessment Deteriorated  Discussed pt with daughter. Consider secondary cause of hypertension but  cannot verify pt's compliance with regimen. Has a history of nonadherence per the daughter. Will remove lisinopril and start CCB instead. See back frequently. Cr 0.87 at last visit. Consider Isabel[facility] visit to ensure taking medications before pursuing work-up given that pt is not symptomatic.   The following medications were removed from the medication list:    Lisinopril 10 Mg Tabs (Lisinopril) ..... One by mouth daily Her updated medication list for this problem includes:    Hydrochlorothiazide 25 Mg Tabs (Hydrochlorothiazide) ..... One by mouth daily    Amlodipine Besylate 10 Mg Tabs (Amlodipine besylate) ..... One by mouth daily for blood pressure  Orders: [place]- Est  Level 4 ([address])  Problem # 2:  DM (ICD-250.00)  Go slow. Has [place] history of diabetes per the daughter. Consider addition of metformin at next visit, but at this point will prioritize BP above diabetes. Reports compliance with glipizide. It will be important to establish and maintain a therapeutic alliance with this patient.  The following medications were removed from the medication list:    Lisinopril 10 Mg Tabs (Lisinopril) ..... One by mouth daily Her updated medication list for this problem includes:    Glipizide 5 Mg Xr24h-tab (Glipizide) ..... 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 not on medications. No insurance so referral at this point may be difficult to obtain. Pt is unwilling to go back to the [facility]. Appears to be controlled at this point. Continue to follow off meds.   Orders: [place]- Est  Level 4 [id number])  Complete Medication List: 1)  Glipizide 5 Mg Xr24h-tab (Glipizide) .... One by mouth daily 2)  Hydrochlorothiazide 25 Mg Tabs (Hydrochlorothiazide) .... One by mouth daily 3)  Amlodipine Besylate 10 Mg Tabs (Amlodipine besylate) .... One by mouth daily for blood pressure  Patient  Instructions: 1)  follow-up with me in 3-4 weeks 2)  stop the lisinopril 3)  start amlodipine for your blood pressure 4)  eat smaller meals and try to limit your intake of salt 5)  we have work to do, but we can get there. Nice to see you today. Prescriptions: AMLODIPINE BESYLATE 10 MG TABS (AMLODIPINE BESYLATE) one by mouth daily for blood pressure  #30 x 2   Entered and Authorized by:   Eugenie Norrie  MD   Signed by:   Eugenie Norrie  MD on 09/01/2009   Method used:   Electronically to        [facility]  [phone]* (retail)       [place], [place]  [address]       Ph: [phone] or [phone]       Fax: [phone]   RxID:   [id number] LISINOPRIL 10 MG TABS (LISINOPRIL) one by mouth daily  #30 x 3   Entered and Authorized by:   Eugenie Norrie  MD   Signed by:   Eugenie Norrie  MD on 09/01/2009   Method used:   Electronically to  [facility]  [phone]* (retail)       [address]       [place], [place]  [address]       Ph: [phone] or [phone]       Fax: [phone]   RxID:   [id number] HYDROCHLOROTHIAZIDE 25 MG TABS (HYDROCHLOROTHIAZIDE) one by mouth daily  #30 x 3   Entered and Authorized by:   Eugenie Norrie  MD   Signed by:   Eugenie Norrie  MD on 09/01/2009   Method used:   Electronically to        [facility]  [phone]* (retail)       [address]       [place], [place]  [address]       Ph: [phone] or [phone]       Fax: [phone]   RxID:   [id number]    Prevention & Chronic Care Immunizations   Influenza vaccine: Not documented    Tetanus booster: Not documented    Pneumococcal vaccine: Not documented  Colorectal Screening   Hemoccult: Not documented    Colonoscopy: Not documented  Other Screening   Pap smear: Not documented    Mammogram: Not documented   Smoking status: never   (09/01/2009)  Diabetes Mellitus   HgbA1C: 10.5  (08/09/2009)    Eye exam: Not documented    Foot exam: Not documented   High risk foot: Not documented   Foot care education: Not documented    Urine microalbumin/creatinine ratio: Not documented  Lipids   Total Cholesterol: Not documented   LDL: Not documented   LDL Direct: 170  (08/09/2009)   HDL: Not documented   Triglycerides: Not documented  Hypertension   Last Blood Pressure: 175 / 104  (09/01/2009)   Serum creatinine: 0.87  (08/09/2009)   Serum potassium 4.4  (08/09/2009)  Self-Management Support :   Personal Goals (by the next clinic visit) :     Personal A1C goal: 8  (09/01/2009)     Personal blood pressure goal: 140/90  (09/01/2009)     Personal LDL goal: 100  (09/01/2009)    Diabetes self-management support: Written self-care plan  (09/01/2009)   Diabetes care plan printed    Hypertension self-management support: Written self-care plan  (09/01/2009)   Hypertension self-care plan printed.   Nursing Instructions: CBG today (see order)

## 2011-01-24 NOTE — Miscellaneous (Signed)
Summary: orders for Kila called from [facility]. willing to go out to provide srvices. needs order faxed to them [phone] stating May start home health services -nursing" message to pcp.Elige Radon RN  December 03, 2009 1:52 PM  see order entered today. will forward to blue team. thanks. Eugenie Norrie  MD  December 03, 2009 1:53 PM  Faxed. ............................................... Janett Billow [facility] December 03, 2009 4:22 PM

## 2011-01-24 NOTE — Assessment & Plan Note (Signed)
Summary: FU/KH   Vital Signs:  Patient profile:   55 year old female Height:      66 inches Weight:      193.9 pounds BMI:     31.41 Temp:     98.0 degrees F oral Pulse rate:   106 / minute BP sitting:   148 / 85  (left arm) Cuff size:   regular  Vitals Entered By: Levert Feinstein LPN (March 30, 624THL 075-GRM PM) CC: f/u Is Patient Diabetic? Yes Did you bring your meter with you today? No Pain Assessment Patient in pain? no        Primary Care Provider:  Eugenie Norrie  MD  CC:  f/u.  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 Pt persistently has issues with adhering to medication regimen, psych issues interplay in to this frequently. Refuses to be seen again at mental health.  **entire visit conducted with sign language interpreter  perseverates on difficulties and frustrations with her husband.  1. Hypertension adherent to medications: reports that she is side effects from medications: denies subjective: doing well with medicines. BP improved today. Notes that cough has resolved with change from lisinopril to losartan.  ROS: chest pain:  no     SOB: no   HA: no     swelling: no  vision changes: no      2. Diabetes taking medications: out of metformin, has been keeping track of sugars. Pt's pill bottle is for 500 mg once a day. Has Dr. Laverle Hobby name on it and prescribed Jan 2010.  Not sure why this is the bottle she has. Last rx sent in by me in Feb.  problems with medications?: denies blood sugar testing frequency:1-3 times per day hypoglycemic events?:no subjective: sugars are mostly in the low 200s.   3. Hyperlipidemia taking medications: not on any problems with medications: n/a  subjective: remembers hearing that her cholesterol has been high, willing to try medication. CMP done in Feb was normal.    Habits & Providers  Alcohol-Tobacco-Diet     Tobacco Status:  never  Current Medications (verified): 1)  Hydrochlorothiazide 25 Mg Tabs (Hydrochlorothiazide) .... One By Mouth Daily 2)  Amlodipine Besylate 10 Mg Tabs (Amlodipine Besylate) .... One By Mouth Daily For Blood Pressure 3)  Metformin Hcl 1000 Mg Tabs (Metformin Hcl) .... One By Mouth Two Times A Day 4)  Blood Glucose Test  Strp (Glucose Blood) .... Please Check Sugars Three Times A Day; Disp 1 Month Supply 5)  Relion Lancing Device  Misc (Lancet Devices) .... For Checking Sugars Two Times A Day 6)  Relion Confirm Glucose Monitor W/device Kit (Blood Glucose Monitoring Suppl) .... For Checking Sugars Two Times A Day 7)  Relion Blood Glucose Test  Strp (Glucose Blood) .... For Checking Sugars Two Times A Day 8)  Losartan Potassium 25 Mg Tabs (Losartan Potassium) .... One By Mouth Daily For Blood Pressure  Allergies (verified): No Known Drug Allergies  Review of Systems       review of systems as noted in HPI section   Physical Exam  General:  vitals signs reviewed -- hypertensive, overweight, mild tachycardia.  Lungs:  Normal respiratory effort, chest expands symmetrically. Lungs are clear to auscultation, no crackles or wheezes. Heart:  II/VI SEM, RRR, normal s1/s2 Extremities:  no cyanosis, clubbing, or edema  Psych:  pleasant today. Perseverates on problems with her husband. Emotionally labile but at baseline.  Impression & Recommendations:  Problem # 1:  HYPERTENSION, ESSENTIAL, UNCONTROLLED (ICD-401.9)  doing well on losartan. Cough is better. Continue current meds. BP nearing goal and drastically improved from when I first met her.  continue current regimen.  Her updated medication list for this problem includes:    Hydrochlorothiazide 25 Mg Tabs (Hydrochlorothiazide) ..... One by mouth daily    Amlodipine Besylate 10 Mg Tabs (Amlodipine besylate) ..... One by mouth daily for blood pressure    Losartan Potassium 25 Mg Tabs (Losartan potassium) ..... One by mouth daily for  blood pressure  BP today: 148/85 Prior BP: 157/50 (02/28/2010)  Labs Reviewed: K+: 4.4 (02/09/2010) Creat: : 0.87 (02/09/2010)   Chol: 237 (02/09/2010)   HDL: 51 (02/09/2010)   LDL: 170 (02/09/2010)   TG: 78 (02/09/2010)  Orders: [facility]- Est  Level 4 (99214)  Problem # 2:  DM (ICD-250.00)  Not sure why she had a pill bottle for metformin 500 mg once a day. Will refill for 1000 mg two times a day. Consistent med adherence is an issue persistently.  Her updated medication list for this problem includes:    Metformin Hcl 1000 Mg Tabs (Metformin hcl) ..... One by mouth two times a day    Losartan Potassium 25 Mg Tabs (Losartan potassium) ..... One by mouth daily for blood pressure  Labs Reviewed: Creat: 0.87 (02/09/2010)    Reviewed HgBA1c results: 9.6 (01/04/2010)  10.5 (08/09/2009)  Orders: [facility]- Est  Level 4 (99214)  Problem # 3:  HYPERLIPIDEMIA (ICD-272.4)  start simva. Would like to get LDL below 130 at least.  Her updated medication list for this problem includes:    Simvastatin 40 Mg Tabs (Simvastatin) ..... One by mouth daily for cholesterol  Labs Reviewed: SGOT: 15 (02/09/2010)   SGPT: 35 (02/09/2010)   HDL:51 (02/09/2010)  LDL:170 (02/09/2010)  Chol:237 (02/09/2010)  Trig:78 (02/09/2010)  Orders: [facility]- Est  Level 4 [id number])  Complete Medication List: 1)  Hydrochlorothiazide 25 Mg Tabs (Hydrochlorothiazide) .... One by mouth daily 2)  Amlodipine Besylate 10 Mg Tabs (Amlodipine besylate) .... One by mouth daily for blood pressure 3)  Metformin Hcl 1000 Mg Tabs (Metformin hcl) .... One by mouth two times a day 4)  Blood Glucose Test Strp (Glucose blood) .... Please check sugars three times a day; disp 1 month supply 5)  Relion Lancing Device Misc (Lancet devices) .... For checking sugars two times a day 6)  Relion Confirm Glucose Monitor W/device Kit (Blood glucose monitoring suppl) .... For checking sugars two times a day 7)  Relion Blood Glucose Test Strp (Glucose  blood) .... For checking sugars two times a day 8)  Losartan Potassium 25 Mg Tabs (Losartan potassium) .... One by mouth daily for blood pressure 9)  Simvastatin 40 Mg Tabs (Simvastatin) .... One by mouth daily for cholesterol  Patient Instructions: 1)  start the simvastatin once a day for cholesterol 2)  I'm sending in more metformin. Keep writing down your blood sugars. You are doing a great job.  3)  Continue all your other medicines. 4)  follow-up with me in 2 months. Prescriptions: SIMVASTATIN 40 MG TABS (SIMVASTATIN) one by mouth daily for cholesterol  #30 x 2   Entered and Authorized by:   Eugenie Norrie  MD   Signed by:   Eugenie Norrie  MD on 03/23/2010   Method used:   Electronically to        [facility]  [phone]* (retail)       [phone]  [address]       [place], [place]  [address]       Ph: [phone] or [phone]       Fax: [phone]   RxID:   [id number] AMLODIPINE BESYLATE 10 MG TABS (AMLODIPINE BESYLATE) one by mouth daily for blood pressure  #30 x 2   Entered and Authorized by:   Eugenie Norrie  MD   Signed by:   Eugenie Norrie  MD on 03/23/2010   Method used:   Electronically to        [facility]  [phone]* (retail)       [address]       [place], [place]  [address]       Ph: [phone] or [phone]       Fax: [phone]   RxIDNP:[id number] HYDROCHLOROTHIAZIDE 25 MG TABS (HYDROCHLOROTHIAZIDE) one by mouth daily  #30 x 2   Entered and Authorized by:   Eugenie Norrie  MD   Signed by:   Eugenie Norrie  MD on 03/23/2010   Method used:   Electronically to        [facility]  [phone]* (retail)       [address]       [place], [place]  [address]       Ph: [phone] or [phone]       Fax: [phone]   RxIDTJ:[id number] METFORMIN HCL 1000 MG TABS (METFORMIN HCL) one by mouth two times a day  #60 x 2   Entered and Authorized  by:   Eugenie Norrie  MD   Signed by:   Eugenie Norrie  MD on 03/23/2010   Method used:   Electronically to        [facility]  [phone]* (retail)       [address]       [place], [place]  [address]       Ph: [phone] or [phone]       Fax: [phone]   RxIDES:[id number] BLOOD GLUCOSE TEST  STRP (GLUCOSE BLOOD) please check sugars three times a day; disp 1 month supply  #1 x 11   Entered and Authorized by:   Eugenie Norrie  MD   Signed by:   Eugenie Norrie  MD on 03/23/2010   Method used:   Electronically to        [facility]  [phone]* (retail)       [address]       [place], [place]  [address]       Ph: [phone] or [phone]       Fax: [phone]   RxIDMJ:[id number]    Prevention & Chronic Care Immunizations   Influenza vaccine: Not documented    Tetanus booster: Not documented    Pneumococcal vaccine: Not documented  Colorectal Screening   Hemoccult: Not documented    Colonoscopy: Not documented  Other Screening   Pap smear: Not documented    Mammogram: Not documented   Smoking status: never  (03/23/2010)  Diabetes Mellitus   HgbA1C: 9.6  (01/04/2010)    Eye exam: Not documented    Foot exam: yes  (01/19/2010)   High risk foot: Not documented  Foot care education: Not documented    Urine microalbumin/creatinine ratio: Not documented    Diabetes flowsheet reviewed?: Yes   Progress toward A1C goal: Improved  Lipids   Total Cholesterol: 237  (02/09/2010)   LDL: 170  (02/09/2010)   LDL Direct: 170  (08/09/2009)   HDL: 51  (02/09/2010)   Triglycerides: 78  (02/09/2010)    SGOT (AST): 15  (02/09/2010)   SGPT (ALT): 35  (02/09/2010)   Alkaline phosphatase: 92  (02/09/2010)   Total bilirubin: 0.5  (02/09/2010)    Lipid flowsheet reviewed?: Yes   Progress toward LDL goal: Unchanged  Hypertension   Last Blood Pressure: 148 / 85  (03/23/2010)    Serum creatinine: 0.87  (02/09/2010)   Serum potassium 4.4  (02/09/2010)    Hypertension flowsheet reviewed?: Yes   Progress toward BP goal: Improved  Self-Management Support :   Personal Goals (by the next clinic visit) :     Personal A1C goal: 8  (09/01/2009)     Personal blood pressure goal: 140/90  (09/01/2009)     Personal LDL goal: 130  (03/23/2010)    Diabetes self-management support: Written self-care plan, Education handout  (09/22/2009)    Diabetes self-management support not done because: Good outcomes  (02/09/2010)    Hypertension self-management support: Written self-care plan, Education handout  (09/22/2009)    Hypertension self-management support not done because: Good outcomes  (03/23/2010)    Lipid self-management support: Written self-care plan  (09/22/2009)     Lipid self-management support not done because: Not indicated  (03/23/2010)

## 2011-03-12 LAB — CBC
HCT: 34.9 % — ABNORMAL LOW (ref 36.0–46.0)
HCT: 33 % — ABNORMAL LOW (ref 36.0–46.0)
Hemoglobin: 11.9 g/dL — ABNORMAL LOW (ref 12.0–15.0)
Hemoglobin: 11.3 g/dL — ABNORMAL LOW (ref 12.0–15.0)
MCHC: 34.1 g/dL (ref 30.0–36.0)
MCHC: 34.2 g/dL (ref 30.0–36.0)
MCV: 91.1 fL (ref 78.0–100.0)
MCV: 90.4 fL (ref 78.0–100.0)
Platelets: 294 10*3/uL (ref 150–400)
Platelets: 263 10*3/uL (ref 150–400)
RBC: 3.83 MIL/uL — ABNORMAL LOW (ref 3.87–5.11)
RBC: 3.65 MIL/uL — ABNORMAL LOW (ref 3.87–5.11)
RDW: 12.2 % (ref 11.5–15.5)
RDW: 12.7 % (ref 11.5–15.5)
WBC: 7 10*3/uL (ref 4.0–10.5)
WBC: 6.7 10*3/uL (ref 4.0–10.5)

## 2011-03-12 LAB — BASIC METABOLIC PANEL
BUN: 18 mg/dL (ref 6–23)
BUN: 14 mg/dL (ref 6–23)
CO2: 26 mEq/L (ref 19–32)
CO2: 30 mEq/L (ref 19–32)
Calcium: 9.1 mg/dL (ref 8.4–10.5)
Calcium: 8.7 mg/dL (ref 8.4–10.5)
Chloride: 97 mEq/L (ref 96–112)
Chloride: 98 mEq/L (ref 96–112)
Creatinine, Ser: 0.78 mg/dL (ref 0.4–1.2)
Creatinine, Ser: 0.77 mg/dL (ref 0.4–1.2)
GFR calc Af Amer: >60 mL/min (ref >60)
GFR calc Af Amer: >60 mL/min (ref >60)
GFR calc non Af Amer: >60 mL/min (ref >60)
GFR calc non Af Amer: >60 mL/min (ref >60)
Glucose, Bld: 337 mg/dL — ABNORMAL HIGH (ref 70–99)
Glucose, Bld: 262 mg/dL — ABNORMAL HIGH (ref 70–99)
Potassium: 3.9 mEq/L (ref 3.5–5.1)
Potassium: 4.1 mEq/L (ref 3.5–5.1)
Sodium: 131 mEq/L — ABNORMAL LOW (ref 135–145)
Sodium: 133 mEq/L — ABNORMAL LOW (ref 135–145)

## 2011-03-12 LAB — DIFFERENTIAL
Basophils Absolute: 0 10*3/uL (ref 0.0–0.1)
Basophils Relative: 1 % (ref 0–1)
Eosinophils Absolute: 0.2 10*3/uL (ref 0.0–0.7)
Eosinophils Relative: 3 % (ref 0–5)
Lymphocytes Relative: 31 % (ref 12–46)
Lymphs Abs: 2.2 10*3/uL (ref 0.7–4.0)
Monocytes Absolute: 0.6 10*3/uL (ref 0.1–1.0)
Monocytes Relative: 8 % (ref 3–12)
Neutro Abs: 4.1 10*3/uL (ref 1.7–7.7)
Neutrophils Relative %: 58 % (ref 43–77)

## 2011-03-12 LAB — GLUCOSE, CAPILLARY
Glucose-Capillary: 229 mg/dL — ABNORMAL HIGH (ref 70–99)
Glucose-Capillary: 294 mg/dL — ABNORMAL HIGH (ref 70–99)
Glucose-Capillary: 325 mg/dL — ABNORMAL HIGH (ref 70–99)
Glucose-Capillary: 337 mg/dL — ABNORMAL HIGH (ref 70–99)
Glucose-Capillary: 263 mg/dL — ABNORMAL HIGH (ref 70–99)

## 2011-03-12 LAB — D-DIMER, QUANTITATIVE
D-Dimer, Quant: 0.3 ug/mL-FEU (ref 0.00–0.48)
D-Dimer, Quant: <0.22 ug/mL-FEU (ref 0.00–0.48)

## 2011-03-15 LAB — GLUCOSE, CAPILLARY: Glucose-Capillary: 221 mg/dL — ABNORMAL HIGH (ref 70–99)

## 2011-03-30 LAB — COMPREHENSIVE METABOLIC PANEL
ALT: 15 U/L (ref 0–35)
AST: 12 U/L (ref 0–37)
Albumin: 3 g/dL — ABNORMAL LOW (ref 3.5–5.2)
Alkaline Phosphatase: 74 U/L (ref 39–117)
BUN: 9 mg/dL (ref 6–23)
CO2: 30 mEq/L (ref 19–32)
Calcium: 9.3 mg/dL (ref 8.4–10.5)
Chloride: 100 mEq/L (ref 96–112)
Creatinine, Ser: 0.67 mg/dL (ref 0.4–1.2)
GFR calc Af Amer: >60 mL/min (ref >60)
GFR calc non Af Amer: >60 mL/min (ref >60)
Glucose, Bld: 214 mg/dL — ABNORMAL HIGH (ref 70–99)
Potassium: 3.9 mEq/L (ref 3.5–5.1)
Sodium: 137 mEq/L (ref 135–145)
Total Bilirubin: 0.8 mg/dL (ref 0.3–1.2)
Total Protein: 7.1 g/dL (ref 6.0–8.3)

## 2011-03-30 LAB — GLUCOSE, CAPILLARY
Glucose-Capillary: 164 mg/dL — ABNORMAL HIGH (ref 70–99)
Glucose-Capillary: 173 mg/dL — ABNORMAL HIGH (ref 70–99)
Glucose-Capillary: 187 mg/dL — ABNORMAL HIGH (ref 70–99)
Glucose-Capillary: 188 mg/dL — ABNORMAL HIGH (ref 70–99)
Glucose-Capillary: 215 mg/dL — ABNORMAL HIGH (ref 70–99)
Glucose-Capillary: 261 mg/dL — ABNORMAL HIGH (ref 70–99)
Glucose-Capillary: 299 mg/dL — ABNORMAL HIGH (ref 70–99)

## 2011-03-30 LAB — POCT I-STAT, CHEM 8
BUN: 13 mg/dL (ref 6–23)
BUN: 18 mg/dL (ref 6–23)
Calcium, Ion: 1.13 mmol/L (ref 1.12–1.32)
Calcium, Ion: 1.18 mmol/L (ref 1.12–1.32)
Chloride: 93 mEq/L — ABNORMAL LOW (ref 96–112)
Chloride: 99 mEq/L (ref 96–112)
Creatinine, Ser: 0.7 mg/dL (ref 0.4–1.2)
Creatinine, Ser: 0.8 mg/dL (ref 0.4–1.2)
Glucose, Bld: 207 mg/dL — ABNORMAL HIGH (ref 70–99)
Glucose, Bld: 283 mg/dL — ABNORMAL HIGH (ref 70–99)
HCT: 43 % (ref 36.0–46.0)
HCT: 45 % (ref 36.0–46.0)
Hemoglobin: 14.6 g/dL (ref 12.0–15.0)
Hemoglobin: 15.3 g/dL — ABNORMAL HIGH (ref 12.0–15.0)
Potassium: 3.4 mEq/L — ABNORMAL LOW (ref 3.5–5.1)
Potassium: 3.6 mEq/L (ref 3.5–5.1)
Sodium: 132 mEq/L — ABNORMAL LOW (ref 135–145)
Sodium: 136 mEq/L (ref 135–145)
TCO2: 28 mmol/L (ref 0–100)
TCO2: 30 mmol/L (ref 0–100)

## 2011-03-30 LAB — CBC
HCT: 37.3 % (ref 36.0–46.0)
Hemoglobin: 12.8 g/dL (ref 12.0–15.0)
MCHC: 34.2 g/dL (ref 30.0–36.0)
MCV: 88.8 fL (ref 78.0–100.0)
Platelets: 300 10*3/uL (ref 150–400)
RBC: 4.2 MIL/uL (ref 3.87–5.11)
RDW: 12.1 % (ref 11.5–15.5)
WBC: 5.1 10*3/uL (ref 4.0–10.5)

## 2011-03-30 LAB — TSH: TSH: 2.575 u[IU]/mL (ref 0.350–4.500)

## 2011-03-30 LAB — T4, FREE: Free T4: 1.35 ng/dL (ref 0.80–1.80)

## 2011-03-31 LAB — GLUCOSE, CAPILLARY: Glucose-Capillary: 384 mg/dL — ABNORMAL HIGH (ref 70–99)

## 2011-04-02 LAB — COMPREHENSIVE METABOLIC PANEL
ALT: 15 U/L (ref 0–35)
AST: 14 U/L (ref 0–37)
Albumin: 2.9 g/dL — ABNORMAL LOW (ref 3.5–5.2)
Alkaline Phosphatase: 72 U/L (ref 39–117)
BUN: 8 mg/dL (ref 6–23)
CO2: 28 mEq/L (ref 19–32)
Calcium: 8.5 mg/dL (ref 8.4–10.5)
Chloride: 99 mEq/L (ref 96–112)
Creatinine, Ser: 0.54 mg/dL (ref 0.4–1.2)
GFR calc Af Amer: >60 mL/min (ref >60)
GFR calc non Af Amer: >60 mL/min (ref >60)
Glucose, Bld: 348 mg/dL — ABNORMAL HIGH (ref 70–99)
Potassium: 3.8 mEq/L (ref 3.5–5.1)
Sodium: 133 mEq/L — ABNORMAL LOW (ref 135–145)
Total Bilirubin: 0.4 mg/dL (ref 0.3–1.2)
Total Protein: 6.1 g/dL (ref 6.0–8.3)

## 2011-04-02 LAB — CBC
HCT: 27.7 % — ABNORMAL LOW (ref 36.0–46.0)
HCT: 36.7 % (ref 36.0–46.0)
Hemoglobin: 9.4 g/dL — ABNORMAL LOW (ref 12.0–15.0)
Hemoglobin: 12.8 g/dL (ref 12.0–15.0)
MCHC: 33.8 g/dL (ref 30.0–36.0)
MCHC: 35 g/dL (ref 30.0–36.0)
MCV: 92.1 fL (ref 78.0–100.0)
MCV: 90.7 fL (ref 78.0–100.0)
Platelets: 343 10*3/uL (ref 150–400)
Platelets: 368 10*3/uL (ref 150–400)
RBC: 3.01 MIL/uL — ABNORMAL LOW (ref 3.87–5.11)
RBC: 4.05 MIL/uL (ref 3.87–5.11)
RDW: 13.2 % (ref 11.5–15.5)
RDW: 12.6 % (ref 11.5–15.5)
WBC: 6.3 10*3/uL (ref 4.0–10.5)
WBC: 6.8 10*3/uL (ref 4.0–10.5)

## 2011-04-02 LAB — URINALYSIS, ROUTINE W REFLEX MICROSCOPIC
Bilirubin Urine: NEGATIVE
Glucose, UA: >1000 mg/dL — AB
Ketones, ur: NEGATIVE mg/dL
Leukocytes, UA: NEGATIVE
Nitrite: NEGATIVE
Protein, ur: 30 mg/dL — AB
Specific Gravity, Urine: 1.025 (ref 1.005–1.030)
Urobilinogen, UA: 0.2 mg/dL (ref 0.0–1.0)
pH: 5 (ref 5.0–8.0)

## 2011-04-02 LAB — GLUCOSE, CAPILLARY
Glucose-Capillary: 369 mg/dL — ABNORMAL HIGH (ref 70–99)
Glucose-Capillary: 311 mg/dL — ABNORMAL HIGH (ref 70–99)

## 2011-04-02 LAB — URINE MICROSCOPIC-ADD ON

## 2011-04-02 LAB — POCT PREGNANCY, URINE: Preg Test, Ur: NEGATIVE

## 2011-04-10 LAB — CULTURE, ROUTINE-ABSCESS

## 2011-05-03 ENCOUNTER — Inpatient Hospital Stay (INDEPENDENT_AMBULATORY_CARE_PROVIDER_SITE_OTHER)
Admission: RE | Admit: 2011-05-03 | Discharge: 2011-05-03 | Disposition: A | Discharge status: Home / Self Care | Payer: Medicare Other | Source: Ambulatory Visit | Attending: Family Medicine | Admitting: Family Medicine

## 2011-05-03 DIAGNOSIS — R609 Edema, unspecified: Secondary | ICD-10-CM

## 2011-05-09 NOTE — Group Therapy Note (Signed)
NAMEWAI, NAJAR NO.:  [id number]   MEDICAL RECORD NO.:  [id number]          PATIENT TYPE:  WOC   LOCATION:  [facility]                   FACILITY:  [facility]   PHYSICIAN:  Andrew Au, MD        DATE OF BIRTH:  Feb 17, 1956   DATE OF SERVICE:                                  CLINIC NOTE   Patient is a 55 year old African American female who is a deaf mute and  gravida 2 para 2-0-0-2.  She lives with her husband, is not sexually  active and is here with an interpreter with a chief complaint of having  missed periods in January and March and having had one in May.  I have  talked to her and told her that she probably is perimenopausal and  discussed that periods could get closer or further apart or stop all  together, and that by the age of 69 or 69 she is probably going to stop  all together.  This has seemed to satisfy her and we are going to send  her for a mammogram, which she has never had before.  The patient was  placed in the dorsal lithotomy position, the Peterson speculum was used.  External genitalia was normal, BUS within normal limits, there was no  sign of any previous __________  abscess or cyst.  Vagina was well  rugated.  The cervix is clean and parous.  Pap smear was taken.  The  uterus is anterior, upper limits of normal, and somewhat irregular in  configuration.  The adnexa could not be well outlined.   The patient weighs 199 pounds, blood pressure 177/98, pulse is 109,  temperature is 98.2.   IMPRESSION:  Normal gynecological examination, perimenopausal symptoms.           ______________________________  Andrew Au, MD     PR/MEDQ  D:  05/08/2007  T:  05/08/2007  Job:  [id number]

## 2011-05-09 NOTE — Group Therapy Note (Signed)
[name], [name] NO.:  [id number]   MEDICAL RECORD NO.:  [id number]          PATIENT TYPE:  WOC   LOCATION:  [facility]                   FACILITY:  [facility]   PHYSICIAN:  Andrew Au, MD        DATE OF BIRTH:  1956-06-17   DATE OF SERVICE:  03/06/2008                                  CLINIC NOTE   The patient a 55 year old African American deaf mute gravida 2, para 2-0-  0-2 who had continual heavy vaginal bleeding and symptomatic fibroids, a  14-week size uterus who underwent D&C as endometrial biopsy in the  office was impossible with benign findings.  Since that time her chronic  severe pain and bleeding have virtually stopped.  As she was in today  for a consultation preop for total abdominal hysterectomy but because of  the improvement we have decided to cancel her surgery.  We have told her  that if it comes back and she starts having the problem again.  I think  she was so afraid of cancer that the symptoms were fairly magnified.  In  addition, the patient has severe left shoulder pain and could not lift  her arm that have developed recently. She got an MRI and shows a  bursitis.  It looks like a large cyst on the acromion..  We are going to  have her referred to an orthopedic surgeon. She does have Medicare and  Medicaid and the patient will need a translator as she is a deaf mute.   IMPRESSION:  Symptomatic leiomyomata improved. Canceled surgery.           ______________________________  Andrew Au, MD     PR/MEDQ  D:  03/06/2008  T:  03/07/2008  Job:  [id number]

## 2011-05-09 NOTE — Group Therapy Note (Signed)
[name], [name]                  ACCOUNT NO.:  [id number]   MEDICAL RECORD NO.:  [id number]          PATIENT TYPE:  WOC   LOCATION:  [facility]                   FACILITY:  [facility]   PHYSICIAN:  [name], CNM  DATE OF BIRTH:  May 22, 1956   DATE OF SERVICE:                                  CLINIC NOTE   REASON FOR VISIT:  Followup after evaluation for abnormal uterine  bleeding.  The patient was first seen at the maternity admissions unit  treated on July 06, 2009, with increased bleeding.  Ultrasound was  completed and was found that she had uterine fibroids and endometrial  stripe was 15 mm.  She was given run of Provera to take and discharged  home to follow up at the [facility].  She reappeared at the maternity  admissions unit on July 18, 2009, with continued vaginal bleeding.  Exam  was within normal limits and deferred for her visit for the [facility].  She appeared on August 04, 2009, here and after exam, Dr. Kalman [name] examined  her and found increased cottage cheese discharge with excoriation to the  vaginal area.  Bimanual exam showed a 14 weeks pregnancy size.  Labs  were obtained.  FSH, LH, CBC, CMP.  Significant results are [place] 51.8,  and her LH 33.5 indicating postmenopausal.  CBC, hemoglobin was 11.1,  hematocrit 34.3.   The patient's blood pressure at that visit was 193/98, and her blood  sugar was 300.  Discussed with the patient possibility of needing a  surgery.  However, needed to be deferred until blood pressure and  diabetes were under control.  The patient did follow up at the [facility] on August 09, 2009, in which she saw Dr. Sarita [name].  She  was prescribed glipizide 5 mg, atenolol 1 daily, and lisinopril 5 mg  daily for her hypertension and has a followup appointment on September 01, 2009.  Today at her visit, the patient is reporting a nil return of  vaginal bleeding and/or uterine pain.   PHYSICAL EXAMINATION:  GENERAL:  The patient is  alert and oriented here  with interpreter.  VITAL SIGNS:  Stable.  Temperature 98.0, pulse 98, blood pressure  150/88.  GU:  Uterus no change.  Vagina, the patient declined a vaginal exam but  stated that her itching and irritation has resolved.   ASSESSMENT:  1. Uterine fibroids.  2. Hypertension.  3. Type 2 diabetes.   PLAN:  I explained to the patient regarding labs and entering  postmenopause and scheduled a mammogram.  The patient is to follow up  with her Internal Medicine appointment and is to reschedule appointment  here after that visit in which we are to further evaluate if the  bleeding has returned or resolved.      [name], CNM    WM/MEDQ  D:  08/19/2009  T:  08/20/2009  Job:  [id number]

## 2011-05-09 NOTE — Group Therapy Note (Signed)
NAMEKAYDENSE, SCIALDONE NO.:  [id number]   MEDICAL RECORD NO.:  [id number]          PATIENT TYPE:  WOC   LOCATION:  [facility]                   FACILITY:  [facility]   PHYSICIAN:  Andrew Au, MD        DATE OF BIRTH:  December 19, 1956   DATE OF SERVICE:  12/12/2007                                  CLINIC NOTE   The patient is a 55 year old African American deaf mute gravida 2, para  2-0-0-2 who has had continual irregular heavy vaginal bleeding.  She is  known to have fibroids and a ultrasound that showed a 14-cm uterus.  Attempts were made to do endometrial biopsy but the lower segment  fibroid blocked the ability to sound the uterus without to much patient  discomfort, so we decided with do and diagnostic hysteroscopy and D&C in  the hospital, and then see if the patient is a good candidate for  endometrial ablation.   The patient weighs 199, blood pressure 177/98, pulse 109, temperature  92.   She was referral from the MAU after not having any bleeding since  February.   FAMILY HISTORY:  Heart attack and high blood pressure in her mother.   REVIEW OF SYSTEMS:  With exception of her deafness and the present  illness, she is in pretty good health.  She takes multivitamins and iron  daily.  Other than that, she has not on any medications.   IMPRESSION:  Perimenopausal bleeding, uterine fibroids.           ______________________________  Andrew Au, MD     PR/MEDQ  D:  12/12/2007  T:  12/13/2007  Job:  [id number]

## 2011-05-09 NOTE — Op Note (Signed)
NAME:  [name], [name]                  ACCOUNT NO.:  [id number]   MEDICAL RECORD NO.:  [id number]          PATIENT TYPE:  AMB   LOCATION:  [facility]                           FACILITY:  [facility]   PHYSICIAN:  Phil D. Kalman Shan, M.D.     DATE OF BIRTH:  [birth year]   DATE OF PROCEDURE:  12/23/2007  DATE OF DISCHARGE:  12/23/2007                               OPERATIVE REPORT   PROCEDURE:  Attempted D&C.   PREOPERATIVE DIAGNOSIS:  Intractable menometrorrhagia with fibroids.   POSTOPERATIVE DIAGNOSIS:  Intractable menometrorrhagia with fibroids.   SURGEON:  Dr. Kalman Shan.   FINDINGS:  Large fibroid blocking the cervical os.   SPECIMEN:  Endocervical curettage.   ESTIMATED BLOOD LOSS:  Minimal.   DESCRIPTION OF PROCEDURE:  Under satisfactory general anesthesia with  the patient in the dorsal lithotomy position, the perineum and vagina  prepped and draped in the usual sterile manner.  Bimanual pelvic  examination under anesthesia revealed the uterus about [redacted] weeks  gestational size, freely movable.  A weighted speculum was in the  posterior fourchette of the vagina.  The anterior lip of the cervix was  grasped with a single-tooth tenaculum and vagina was clean and well  rugated, the cervix  was clean and parous, but the uterine cavity could  not be sounded because of a leiomyomata blocking the introitus  approximately 2 cm from the distal endocervical. The cervix was curetted  with a sharp curette and specimen sent for pathologic diagnosis  and the  procedure was terminated at that point. The tenaculum and speculum were  removed from the vagina.      Phil D. Kalman Shan, M.D.  Electronically Signed     PDR/MEDQ  D:  01/10/2008  T:  01/10/2008  Job:  [id number]

## 2011-05-09 NOTE — Group Therapy Note (Signed)
[name], [name] NO.:  [id number]   MEDICAL RECORD NO.:  [id number]          PATIENT TYPE:  WOC   LOCATION:  [facility]                   FACILITY:  [facility]   PHYSICIAN:  Andrew Au, MD        DATE OF BIRTH:  November 01, 1956   DATE OF SERVICE:  08/04/2009                                  CLINIC NOTE   HISTORY:  The patient is a 55 year old African American female, deaf and  mute, gravida 2, para 2-0-0-2, who is here with an interpreter.  She was  last seen here in March 2009 for abnormal bleeding.  She had to have a  D&C because endometrial biopsy was impossible, that turned out to be  fine.  She had fibroids over 14 weeks' size and now measures 16 cm by  ultrasound.  Was seen in the MAU because of an episode of heavy bleeding  with passage of clots, and on July 13, the bleeding stopped and it never  restarted again.  The patient was concerned about the enlarged fibroids,  but in addition to this, she is an untreated hypertensive with diabetes  mellitus.  Her blood pressure today was 193/98 and her blood sugar was  about 300.   ALLERGIES:  She reports she is allergic to MENTHOL.   She apparently has had runnings with the people at [facility].  Apparently, she thinks had a bad experience with an interpreter before  because everyone in her church knew of her problems and she is in to see  [place] today because of the referral from the MAU.  At the present time, the  patient is not bleeding.   On examination, her abdomen is soft, flat, nontender with a mass  palpable almost to the umbilicus.  The genitalia in the external shows  marked excoriation of the whole area and inguinal area down to the  perineum to the perianal area, which resembles a vaginal vulvar  dystrophy with a heavy cottage-cheese like vaginal discharge, it  certainly could be a yeast infection, although she said that she has had  this for sometime, the itching is very bad, and she was treated  with  medication that she was very allergic to by Mental Health.  It is  difficult to understand what the history of her medications are, but at  the present time she takes none.  Besides the external genitalia, the  vagina appears to be very atrophic, stenotic.  The cervix could not be  well visualized, although Pap smear was taken as well this could be as  well as wet prep with heavy watery-cream cheese like discharge.  No sign  of any vaginal bleeding however.  This was very uncomfortable for the  patient.  Bimanual was nonrevealing except that the cervix was way  posterior.  The uterus appeared to be at least the size of a 14-week  pregnancy.  Adnexa could not be outlined.   I have told the patient that even if she needed surgery, no one would do  it until we got her blood pressure and the diabetes under  control.  We  are getting her Internal Medicine referral.  I would hope she would  follow that up.  I am going to put her on hydrochlorothiazide 25,  however, at least until she gets there.  I am going to get a CMET on her  as well as check her [place] and LH and get a CBC.  Because of the bleeding  history and because the fact that she is perimenopausal, I expect the  thing disturbing on the ultrasound was a thickened endometrium.  She had  a similar thing a year ago, had a D&C for that, which showed  degenerative changes, but no sign of any hyperplasia or malignancy and I  am hoping that is the same today it is because endometrial biopsy is  impossible on this patient even by Dr. Hulan Fray.  The patient is going to  come back in 2 weeks for her results and I am going to give her a  referral for mammogram also.   IMPRESSION:  Dysfunctional uterine bleeding, one episode of large  uterine fibroids, hypertension with diet uncontrolled diabetes, and the  patient is deaf and mute.           ______________________________  Andrew Au, MD     PR/MEDQ  D:  08/04/2009  T:  08/05/2009  Job:   [id number]

## 2011-05-12 ENCOUNTER — Ambulatory Visit (INDEPENDENT_AMBULATORY_CARE_PROVIDER_SITE_OTHER): Payer: Medicare Other | Admitting: Family Medicine

## 2011-05-12 VITALS — BP 227/111 | HR 108 | Temp 97.9°F | Wt 209.0 lb

## 2011-05-12 DIAGNOSIS — R609 Edema, unspecified: Secondary | ICD-10-CM

## 2011-05-12 DIAGNOSIS — I10 Essential (primary) hypertension: Secondary | ICD-10-CM

## 2011-05-12 DIAGNOSIS — R600 Localized edema: Secondary | ICD-10-CM

## 2011-05-12 MED ORDER — FUROSEMIDE 40 MG PO TABS: ORAL_TABLET | ORAL | Status: DC

## 2011-05-12 NOTE — Discharge Summary (Signed)
NAMESYDEL, WILENSKY                  ACCOUNT NO.:  [id number]   MEDICAL RECORD NO.:  [id number]          PATIENT TYPE:  OBV   LOCATION:  9317                          FACILITY:  [facility]   PHYSICIAN:  Phil D. Kalman Shan, M.D.     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  She was admitted by Dr. Kathyrn Drown because of anemia,  menometrorrhagia, with a 6.4 hemoglobin; was transfused 2 units of packed  cells and had a hemoglobin post transfusion of 7.4.  In the meantime, she  had been treated with Ovcon, which has controlled her bleeding to a great  degree.  On ultrasound she has large fibroids with a uterus measuring 15.2  cm, in transverse diameter 9.3 x 11 cm, a distorted endometrial cavity with  a thickened endometrium.   The plan is to further transfuse the patient another 2 units of packed  cells, 2 hours later to get a CBC before her discharge, and then to  discharge the patient as her mother's funeral is tomorrow.  We will arrange  with the GYN office to have the patient come back for dilatation and  curettage so we can further evaluate this patient for probable hysterectomy,  and that will be done in the next 2 weeks.  The patient, with the translator  present, has been counseled as to all these things.  We have discussed  transfusion and the risks that are involved.  She agrees that this is the  best treatment for her and will comply with whatever we decide to do.  The  patient has no history of surgeries, no significant medical history.  She  takes only iron as a medication.   DISCHARGE DIAGNOSES:  1.  Blood loss anemia secondary to excessive uterine bleeding.  2.  Leiomyomata uteri.  3.  Thickened endometrium.      PDR/MEDQ  D:  11/23/2004  T:  11/23/2004  Job:  [id number]

## 2011-05-12 NOTE — Op Note (Signed)
NAME:  [name], [name]                  ACCOUNT NO.:  [id number]   MEDICAL RECORD NO.:  [id number]          PATIENT TYPE:  AMB   LOCATION:  SDC                           FACILITY:  [facility]   PHYSICIAN:  Phil D. Kalman Shan, M.D.     DATE OF BIRTH:  [birth year]   DATE OF PROCEDURE:  12/05/2004  DATE OF DISCHARGE:                                 OPERATIVE REPORT   PREOPERATIVE DIAGNOSES:  1.  Menometrorrhagia.  2.  Fibroid tumors of the uterus.  3.  Anemia.   POSTOPERATIVE DIAGNOSES:  1.  Menometrorrhagia.  2.  Fibroid tumors of the uterus.  3.  Anemia.   PROCEDURE:  Exam under anesthesia, dilatation and curettage.   SURGEON:  Franchot Heidelberg. Kalman Shan, M.D.   ANESTHESIA:  General.   POSTOPERATIVE CONDITION:  Satisfactory.   SPECIMENS:  Endometrial curettings.   FINDINGS:  On pelvic examination under anesthesia, the external genitalia  was normal.  BUS within normal limits.  The vagina was clean and well-  rugated.  The cervix was clean, parous, and very far anterior, with bimanual  showing the enlarged uterus with multiple fibroids.  Difficult to outline  because of the habitus of the patient, but on ultrasound, one that measured  at least 15 cm in diameter.  The uterine cavity sounded to a depth of 7.5 cm  and was regular in configuration, with no significant abnormalities noted.   DESCRIPTION OF PROCEDURE:  Under satisfactory general anesthesia which was  used because with sedation and MAC sedation and local, the patient could not  be communicated with because of her hearing disability.  The patient was in  the dorsal lithotomy position.  The perineum and vagina were prepped and  draped in the usual sterile manner.  The findings were as above.   A weighted speculum was placed in the posterior fourchette of the vagina.  The anterior lip of the cervix was grasped with a single-tooth tenaculum.  The uterine cavity sounded to a depth of 7.5 cm.  The uterine cavity was  explored with a polyp forceps  followed by curettage with a small serrated  curette.  There was found to be a regular configuration with no significant  fibroids noted by curette palpation.  The tissue was sent for pathological  diagnosis.  The tenaculum and speculum were removed from the vagina.   The patient was transferred to the recovery room in satisfactory condition.   IMPRESSION:  The patient seems to have a normal-size uterine cavity in spite  of the enlarged attached fibroids.  They do not seem to be impinging on her  endometrial cavity in which the bleeding seems to be controlled by the Ovcon  oral contraceptives that she has put on.  She is a nonsmoker with no  hypertension.  I think we will try and keep her on this medicine for the  next four years until the age of menopause in a hope that the fibroids will  shrink down.  Unless she has breakthrough hemorrhage or heavy bleeding, I  see no reason at the present time  to put this lady through a hysterectomy.  This certainly could change in the future.  I am fairly certain that the  pathology will be benign.  If it is not, we will contact the patient.  We  have discussed this with her interpreter who is going to pass it on to the  patient when she comes to the recovery room.      PDR/MEDQ  D:  12/05/2004  T:  12/05/2004  Job:  [id number]

## 2011-05-12 NOTE — Patient Instructions (Signed)
Make an appt with Me, Dr. Buelah Manis next wed to recheck your legs Take the water pill as prescribed- 1 tablet in the morning and 1 in the evening If you have difficulty breathing or chest pain go to the ER

## 2011-05-12 NOTE — Progress Notes (Signed)
  Subjective:    Patient ID: [name], female    DOB: 1956-04-17, 55 y.o.   MRN: [id number]  HPI  Ms. Lall is present with her husband and sign language interpreter. She is concerned about her bilateral leg swelling Right greater than left. Note she was very difficult to understand even with the interpreter because she was very frustrated and appeared upset.   Right leg began swelling initally in foot and moved up to leg in April, May the left leg began to swell, no [place] trips, no injury, no previous history of swelling. She has not taken any blood pressure medications or diabetes meds as she does not feel that is the problem and doctors have given her the disease based on interpretation.   No SOB, no CP, no HA, no Abd pain, no recent illness  She was concerned she needs a shot in her foot- over the "thick" area to clear this up, just like her husband had and the one she had in her shoulder, she repeated this numerous times that all she wanted was a shot    Review of Systems per above     Objective:   Physical Exam    GEN- NAD, alert and oriented, uses sign language     CVS- RRR, no murmur, no JVD     RESP- CTAB     ABD- NABS, soft, NT, ND, no hepatomegaly     Ext- 3+ pitting edema RLE, 2 to knees, 2+edeme pitting LLE, pulses 1+bilat, TTP over bilat feet, callus skin on soles, 3rd toe with callus - no signs of infection- no erthema, area closed, no discharge expressed       Assessment & Plan:    Leg Edema- pitting edema in setting of severely uncontrolled HTN as pt does not feel the two are related, she declined anti-hypertensives, I spent a great deal of time explaining why a shot was not needed and that she needed to bring her BP down. She agreed to take the Lasix, BMET was obtained follow-up next week, elevation, will discuss compression hose next week, if she does not respond obtain dopplers  HTN- pt reluctant to take any medications, does not feel she has HTN or other  co-morbidites, I reviewed PCP notes on this.

## 2011-05-13 LAB — BASIC METABOLIC PANEL
BUN: 21 mg/dL (ref 6–23)
CO2: 27 mEq/L (ref 19–32)
Calcium: 9 mg/dL (ref 8.4–10.5)
Chloride: 99 mEq/L (ref 96–112)
Creat: 0.73 mg/dL (ref 0.40–1.20)
Glucose, Bld: 300 mg/dL — ABNORMAL HIGH (ref 70–99)
Potassium: 4.1 mEq/L (ref 3.5–5.3)
Sodium: 135 mEq/L (ref 135–145)

## 2011-05-16 ENCOUNTER — Telehealth: Payer: Self-pay | Admitting: Family Medicine

## 2011-05-16 NOTE — Telephone Encounter (Signed)
Neither phone numbers worked. I was trying to reach [employer] pt daughter to check on leg swelling.  Pt is deaf and so is husband. She has an appt with me on Thursday afternoon

## 2011-05-18 ENCOUNTER — Ambulatory Visit (INDEPENDENT_AMBULATORY_CARE_PROVIDER_SITE_OTHER): Payer: Medicare Other | Admitting: Family Medicine

## 2011-05-18 VITALS — BP 198/96 | HR 105 | Temp 97.8°F | Ht 66.0 in | Wt 207.0 lb

## 2011-05-18 DIAGNOSIS — I10 Essential (primary) hypertension: Secondary | ICD-10-CM

## 2011-05-18 DIAGNOSIS — R609 Edema, unspecified: Secondary | ICD-10-CM

## 2011-05-18 MED ORDER — CLONIDINE HCL 0.2 MG PO TABS
0.2000 mg | ORAL_TABLET | Freq: Once | ORAL | Status: AC
  Administered 2011-05-18: 0.2 mg via ORAL

## 2011-05-18 MED ORDER — FUROSEMIDE 20 MG PO TABS: 20.0000 mg | ORAL_TABLET | Freq: Two times a day (BID) | ORAL | Status: DC

## 2011-05-18 NOTE — Assessment & Plan Note (Addendum)
Severely elevated BP for many months. Gave [name] dose of Clonidine 0.2mg  in clinic this did not bring BP down, she refused any other meds Discussed how HTN comes about, how she has developed over time, adverse outcomes Stroke, Renal failure, MI, Vision problems, [name] denied any of these and became very upset. Hopefully [name] will take Lasix, she need at least 2 drug regimine, with severe edema needs an Echo

## 2011-05-18 NOTE — Progress Notes (Signed)
  Subjective:    Patient ID: [name], female    DOB: 1956-10-03, 55 y.o.   MRN: [id number]  HPI  [name] [id number]  Pt here to f/u her leg swelling and HTN, she has not picked up the medication, I was not able to reach her by phone listed in the chart to check on her either. No CP, No HA, has complained of some vision problems in the past, occ has blurry vision, feels like her vision has been foggy lately, has trouble up close but can see far away   Legs still causing pain  Pt became upset like last week and then only wanted to talk about her legs Review of Systems     Objective:   Physical Exam    GEN- NAD, alert and oriented, uses sign language     CVS- RRR, ? 2/6 SEM not heard well last week      HEENT- PERRL, fundoscopic exam difficult- very small pupils could not see entire optic disc, noted propable AV nicking     RESP- CTAB     Ext- 3+ pitting edema RLE, 2 to knees, 2+edeme pitting LLE, pulses 1+bilat, TTP over bilat feet,      Assessment & Plan:

## 2011-05-18 NOTE — Patient Instructions (Signed)
Take the water pill as prescribed- 1 tablet in the morning and 1 in the evening If you have difficulty breathing or chest pain go to the ER  Make a follow-up appt on Monday for a recheck or follow-up with Dr.Saunders

## 2011-05-18 NOTE — Assessment & Plan Note (Signed)
Unchanged from last week, start lasix pt, states she will get money from her daughter

## 2011-05-23 ENCOUNTER — Encounter: Payer: Self-pay | Admitting: Family Medicine

## 2011-05-23 ENCOUNTER — Ambulatory Visit (INDEPENDENT_AMBULATORY_CARE_PROVIDER_SITE_OTHER): Payer: Medicare Other | Admitting: Family Medicine

## 2011-05-23 DIAGNOSIS — E119 Type 2 diabetes mellitus without complications: Secondary | ICD-10-CM

## 2011-05-23 DIAGNOSIS — I10 Essential (primary) hypertension: Secondary | ICD-10-CM

## 2011-05-23 DIAGNOSIS — R609 Edema, unspecified: Secondary | ICD-10-CM

## 2011-05-23 LAB — POCT GLYCOSYLATED HEMOGLOBIN (HGB A1C): Hemoglobin A1C: 9.2

## 2011-05-23 MED ORDER — LOSARTAN POTASSIUM-HCTZ 50-12.5 MG PO TABS: 1.0000 | ORAL_TABLET | Freq: Every day | ORAL | Status: DC

## 2011-05-23 MED ORDER — POTASSIUM CHLORIDE ER 10 MEQ PO TBCR: 10.0000 meq | EXTENDED_RELEASE_TABLET | Freq: Two times a day (BID) | ORAL | Status: DC

## 2011-05-23 MED ORDER — LISINOPRIL-HYDROCHLOROTHIAZIDE 20-12.5 MG PO TABS: 1.0000 | ORAL_TABLET | Freq: Every day | ORAL | Status: DC

## 2011-05-23 NOTE — Progress Notes (Signed)
  Subjective:    Patient ID: [name], female    DOB: [birth year], 55 y.o.   MRN: [id number]  HPI  1. HTN: On no medications. Has refused them in the past.  2. DM: No recent A1c. On no medications. Has refused them in the past.  3. LE Edema: Most concerning symptom to patient. Rx Lasix 20 mg po BID started last week. Endorses improvement in LE edema. BMP WNL except glucose 300.  Patient seen with her daughter, Lavella Hammock, and interpretor in the room.  Review of Systems SEE HPI.  Denies CP, SOB, cough, N/V/D/C, rash. Objective:   Physical Exam  Vitals reviewed. Constitutional: She appears well-developed and well-nourished. No distress.       Deaf.  Cardiovascular: Normal rate, normal heart sounds and intact distal pulses.   Pulmonary/Chest: Breath sounds normal. She has no rales.  Abdominal: Soft. Bowel sounds are normal.  Musculoskeletal: She exhibits edema.       1-2 + bilateral ankles.  Skin:       Bilateral Foot Exam: Normal sensation. Normal DP. Onychomycosis. No signs of infection.      Assessment & Plan:

## 2011-05-23 NOTE — Assessment & Plan Note (Signed)
Improving. Okay to increase am dose to 2 tabs x 1 week. Adding KCL and rechecking BMP.

## 2011-05-23 NOTE — Assessment & Plan Note (Signed)
Rx ACE originally, but discovered patient's previous reaction of cough in past - so changed to ARB with HCTZ. Discussed risk of uncontrolled HTN with patient. She endorsed understanding and was willing to start one more medication for treatment of this condition.

## 2011-05-23 NOTE — Patient Instructions (Signed)
It was nice to meet you today.  I am adding 2 medications to your regimen.  We are checking labs today.  Follow up in 1 week.

## 2011-05-23 NOTE — Assessment & Plan Note (Addendum)
Checking A1c today. Discussed risks of uncontrolled, untreated DM. Patient endorsed understanding.

## 2011-05-24 LAB — BASIC METABOLIC PANEL
BUN: 20 mg/dL (ref 6–23)
CO2: 32 mEq/L (ref 19–32)
Calcium: 9.4 mg/dL (ref 8.4–10.5)
Chloride: 99 mEq/L (ref 96–112)
Creat: 0.76 mg/dL (ref 0.40–1.20)
Glucose, Bld: 239 mg/dL — ABNORMAL HIGH (ref 70–99)
Potassium: 3.8 mEq/L (ref 3.5–5.3)
Sodium: 136 mEq/L (ref 135–145)

## 2011-05-25 ENCOUNTER — Other Ambulatory Visit: Payer: Self-pay | Admitting: Family Medicine

## 2011-05-25 DIAGNOSIS — E119 Type 2 diabetes mellitus without complications: Secondary | ICD-10-CM

## 2011-05-26 ENCOUNTER — Telehealth: Payer: Self-pay | Admitting: *Deleted

## 2011-05-26 NOTE — Telephone Encounter (Signed)
Called [facility] and scheduled appt for pt Monday 07-03-11 at 1:15 pm. Spoke with Janett Billow, she will mail appt info to pt and also schedule sign language interpretor for pt. Fwd. To Dr.Wallace for info .[place]

## 2011-05-30 ENCOUNTER — Ambulatory Visit: Payer: Medicare Other | Admitting: Family Medicine

## 2011-06-02 ENCOUNTER — Encounter: Payer: Self-pay | Admitting: Family Medicine

## 2011-06-02 ENCOUNTER — Ambulatory Visit (INDEPENDENT_AMBULATORY_CARE_PROVIDER_SITE_OTHER): Payer: Medicare Other | Admitting: Family Medicine

## 2011-06-02 DIAGNOSIS — I10 Essential (primary) hypertension: Secondary | ICD-10-CM

## 2011-06-02 DIAGNOSIS — R609 Edema, unspecified: Secondary | ICD-10-CM

## 2011-06-02 DIAGNOSIS — E119 Type 2 diabetes mellitus without complications: Secondary | ICD-10-CM

## 2011-06-02 NOTE — Assessment & Plan Note (Signed)
Again reviewed risks of high blood pressure including heart attack, stroke, and kidney problems.  She understood but did not want to start any medications.  She wanted to do everything natural.  Recommended some dietary changes including decreasing carbs and limiting salt while eating more green, leafy vegetables.  She said that she will try this.

## 2011-06-02 NOTE — Progress Notes (Signed)
  Subjective:    Patient ID: [name], female    DOB: 19-Jan-1956, 55 y.o.   MRN: [id number]  HPI Performed with the use of an interpretor  1. Leg swelling:  Seen back in May for this and started on Lasix.   Right leg began swelling initally in foot and moved up to leg in April, May the left leg began to swell, no [place] trips, no injury, no previous history of swelling.  This has greatly improved the leg swelling and she denies any current leg swelling or pain  2. HTN;  She is aware that her blood pressure is elevated.  She adamantly refuses taking medications.   3. DMII: she doesn't think that the doctors were right about her having DMII.  Not willing to start any medications    Review of Systems Denies chest pain, shortness of breath, focal numbness / weakness.  Denies joint / knee pain.       Objective:   Physical Exam     GEN- NAD, alert and oriented, uses sign language     CVS- RRR, no murmur, no JVD     RESP- CTAB     ABD- NABS, soft, NT, ND, no hepatomegaly     Ext- No LE edema.  pulses 1+bilat, TTP over bilat feet calluses       Assessment & Plan:

## 2011-06-02 NOTE — Assessment & Plan Note (Signed)
Greatly improved after use of Lasix.  She has been out of it for about a week and the swelling has not returned.  She would also prefer to not be on any medications.  Will not continue Lasix for now.  If swelling returns would restart.

## 2011-06-02 NOTE — Patient Instructions (Signed)
Your blood pressure is too high.   You are at risk for a heart attack, stroke, and kidney problems. Medications would help but we can't force you to take them Try eating less carbs and less salt If the leg swelling comes back please return to clinic Please schedule a follow up appointment in 1-2 months

## 2011-06-02 NOTE — Assessment & Plan Note (Signed)
Not willing to start medications.  Reviewed risks of uncontrolled DM.  Recommended some dietary changes.

## 2011-09-25 LAB — CBC
HCT: 42.2
Hemoglobin: 13.9
MCHC: 32.9
MCV: 90.8
Platelets: 280
RBC: 4.65
RDW: 11.9
WBC: 5.8

## 2011-09-25 LAB — BASIC METABOLIC PANEL
BUN: 12
CO2: 29
Calcium: 9.2
Chloride: 98
Creatinine, Ser: 0.58
GFR calc Af Amer: >60
GFR calc non Af Amer: >60
Glucose, Bld: 345 — ABNORMAL HIGH
Potassium: 3.8
Sodium: 133 — ABNORMAL LOW

## 2011-09-25 LAB — DIFFERENTIAL
Basophils Absolute: 0
Basophils Relative: 1
Eosinophils Absolute: 0.1
Eosinophils Relative: 2
Lymphocytes Relative: 33
Lymphs Abs: 1.9
Monocytes Absolute: 0.5
Monocytes Relative: 8
Neutro Abs: 3.2
Neutrophils Relative %: 56

## 2011-09-25 LAB — WET PREP, GENITAL
Clue Cells Wet Prep HPF POC: NONE SEEN
Trich, Wet Prep: NONE SEEN

## 2011-09-25 LAB — GC/CHLAMYDIA PROBE AMP, GENITAL
Chlamydia, DNA Probe: NEGATIVE
GC Probe Amp, Genital: NEGATIVE

## 2011-09-25 LAB — GLUCOSE, CAPILLARY: Glucose-Capillary: 329 — ABNORMAL HIGH

## 2011-09-29 LAB — CBC
HCT: 28.6 — ABNORMAL LOW
Hemoglobin: 9.4 — ABNORMAL LOW
MCHC: 32.8
MCV: 88.6
Platelets: 705 — ABNORMAL HIGH
RBC: 3.23 — ABNORMAL LOW
RDW: 15.8 — ABNORMAL HIGH
WBC: 10

## 2011-09-29 LAB — COMPREHENSIVE METABOLIC PANEL
ALT: 18
AST: 12
Albumin: 2.9 — ABNORMAL LOW
Alkaline Phosphatase: 63
BUN: 8
CO2: 27
Calcium: 8.9
Chloride: 101
Creatinine, Ser: 0.56
GFR calc Af Amer: >60
GFR calc non Af Amer: >60
Glucose, Bld: 311 — ABNORMAL HIGH
Potassium: 4.1
Sodium: 133 — ABNORMAL LOW
Total Bilirubin: 0.3
Total Protein: 6.4

## 2011-09-29 LAB — CROSSMATCH
ABO/RH(D): A POS
Antibody Screen: NEGATIVE

## 2011-09-29 LAB — ABO/RH: ABO/RH(D): A POS

## 2011-10-02 LAB — CBC
HCT: 35.3 — ABNORMAL LOW
Hemoglobin: 11.8 — ABNORMAL LOW
MCHC: 33.5
MCV: 94.2
Platelets: 446 — ABNORMAL HIGH
RBC: 3.75 — ABNORMAL LOW
RDW: 14.5
WBC: 4.5

## 2011-10-03 LAB — CBC
HCT: 28.6 — ABNORMAL LOW
Hemoglobin: 9.7 — ABNORMAL LOW
MCHC: 33.9
MCV: 91.8
Platelets: 429 — ABNORMAL HIGH
RBC: 3.11 — ABNORMAL LOW
RDW: 12.8
WBC: 8.4

## 2011-10-03 LAB — WET PREP, GENITAL
Clue Cells Wet Prep HPF POC: NONE SEEN
Trich, Wet Prep: NONE SEEN
Yeast Wet Prep HPF POC: NONE SEEN

## 2011-10-03 LAB — POCT PREGNANCY, URINE
Operator id: 114931
Preg Test, Ur: NEGATIVE

## 2011-10-03 LAB — GC/CHLAMYDIA PROBE AMP, GENITAL
Chlamydia, DNA Probe: NEGATIVE
GC Probe Amp, Genital: NEGATIVE

## 2011-10-03 LAB — SAMPLE TO BLOOD BANK

## 2012-04-03 ENCOUNTER — Inpatient Hospital Stay (HOSPITAL_COMMUNITY)
Admission: AD | Admit: 2012-04-03 | Discharge: 2012-04-03 | Disposition: A | Discharge status: Home / Self Care | Payer: Medicare Other | Source: Ambulatory Visit | Attending: Family Medicine | Admitting: Family Medicine

## 2012-04-03 NOTE — MAU Note (Addendum)
Communicating with patient by writing while waiting for sign language interpreter. Per writing, pt is looking to get her eyes checked out.  With interpreter at bedside patient explained that she needs to schedule an eye exam and was told the eye doctor is near [place] she has no "women's" problems and doesn't need to be seen here. Was trying to figure out where the eye doctor was. With help from the interpreter located the eye doctor as [facility] and patient was discharged and given direction on how to get there.

## 2012-04-17 ENCOUNTER — Encounter (HOSPITAL_COMMUNITY): Payer: Self-pay | Admitting: *Deleted

## 2012-04-17 ENCOUNTER — Emergency Department (HOSPITAL_COMMUNITY)
Admission: EM | Admit: 2012-04-17 | Discharge: 2012-04-17 | Disposition: A | Discharge status: Home / Self Care | Payer: Medicare Other | Attending: Emergency Medicine | Admitting: Emergency Medicine

## 2012-04-17 DIAGNOSIS — I10 Essential (primary) hypertension: Secondary | ICD-10-CM | POA: Diagnosis not present

## 2012-04-17 DIAGNOSIS — E119 Type 2 diabetes mellitus without complications: Secondary | ICD-10-CM | POA: Diagnosis not present

## 2012-04-17 DIAGNOSIS — E785 Hyperlipidemia, unspecified: Secondary | ICD-10-CM | POA: Diagnosis not present

## 2012-04-17 DIAGNOSIS — M7989 Other specified soft tissue disorders: Secondary | ICD-10-CM | POA: Diagnosis not present

## 2012-04-17 DIAGNOSIS — R609 Edema, unspecified: Secondary | ICD-10-CM | POA: Insufficient documentation

## 2012-04-17 DIAGNOSIS — H9190 Unspecified hearing loss, unspecified ear: Secondary | ICD-10-CM | POA: Diagnosis not present

## 2012-04-17 DIAGNOSIS — R799 Abnormal finding of blood chemistry, unspecified: Secondary | ICD-10-CM | POA: Insufficient documentation

## 2012-04-17 LAB — CBC
HCT: 32.7 % — ABNORMAL LOW (ref 36.0–46.0)
Hemoglobin: 11 g/dL — ABNORMAL LOW (ref 12.0–15.0)
MCH: 29.7 pg (ref 26.0–34.0)
MCHC: 33.6 g/dL (ref 30.0–36.0)
MCV: 88.4 fL (ref 78.0–100.0)
Platelets: 440 10*3/uL — ABNORMAL HIGH (ref 150–400)
RBC: 3.7 MIL/uL — ABNORMAL LOW (ref 3.87–5.11)
RDW: 12.2 % (ref 11.5–15.5)
WBC: 7.3 10*3/uL (ref 4.0–10.5)

## 2012-04-17 LAB — COMPREHENSIVE METABOLIC PANEL
ALT: 24 U/L (ref 0–35)
AST: 17 U/L (ref 0–37)
Albumin: 2.4 g/dL — ABNORMAL LOW (ref 3.5–5.2)
Alkaline Phosphatase: 121 U/L — ABNORMAL HIGH (ref 39–117)
BUN: 20 mg/dL (ref 6–23)
CO2: 28 mEq/L (ref 19–32)
Calcium: 9.1 mg/dL (ref 8.4–10.5)
Chloride: 97 mEq/L (ref 96–112)
Creatinine, Ser: 0.82 mg/dL (ref 0.50–1.10)
GFR calc Af Amer: >90 mL/min (ref >90)
GFR calc non Af Amer: 79 mL/min — ABNORMAL LOW (ref >90)
Glucose, Bld: 258 mg/dL — ABNORMAL HIGH (ref 70–99)
Potassium: 4 mEq/L (ref 3.5–5.1)
Sodium: 136 mEq/L (ref 135–145)
Total Bilirubin: 0.3 mg/dL (ref 0.3–1.2)
Total Protein: 6.9 g/dL (ref 6.0–8.3)

## 2012-04-17 LAB — DIFFERENTIAL
Basophils Absolute: 0.1 10*3/uL (ref 0.0–0.1)
Basophils Relative: 1 % (ref 0–1)
Eosinophils Absolute: 0.2 10*3/uL (ref 0.0–0.7)
Eosinophils Relative: 3 % (ref 0–5)
Lymphocytes Relative: 40 % (ref 12–46)
Lymphs Abs: 2.9 10*3/uL (ref 0.7–4.0)
Monocytes Absolute: 0.4 10*3/uL (ref 0.1–1.0)
Monocytes Relative: 6 % (ref 3–12)
Neutro Abs: 3.7 10*3/uL (ref 1.7–7.7)
Neutrophils Relative %: 51 % (ref 43–77)

## 2012-04-17 LAB — GLUCOSE, CAPILLARY: Glucose-Capillary: 241 mg/dL — ABNORMAL HIGH (ref 70–99)

## 2012-04-17 LAB — PRO B NATRIURETIC PEPTIDE: Pro B Natriuretic peptide (BNP): 258.1 pg/mL — ABNORMAL HIGH (ref 0–125)

## 2012-04-17 MED ORDER — METOPROLOL TARTRATE 100 MG PO TABS: 50.0000 mg | ORAL_TABLET | Freq: Two times a day (BID) | ORAL | Status: DC

## 2012-04-17 MED ORDER — HYDROCHLOROTHIAZIDE 25 MG PO TABS: 25.0000 mg | ORAL_TABLET | Freq: Every day | ORAL | Status: DC

## 2012-04-17 NOTE — ED Notes (Signed)
Warm blankets given.

## 2012-04-17 NOTE — ED Notes (Signed)
Asked if pt would like a deaf interpreter, family would like one; interpreter called

## 2012-04-17 NOTE — ED Provider Notes (Signed)
History     CSN: [id number]  Arrival date & time 04/17/12  [id number]   First MD Initiated Contact with Patient 04/17/12 1010      Chief Complaint  Patient presents with  . Leg Swelling  . Diabetes     Patient is a 56 y.o. female presenting with diabetes problem.  Diabetes   Patient has not seen a primary md for at least one year. She has hx of diabetes and htn. She is also deaf. We have called for an interpretor. Patient complains of leg swelling, feet swelling, vision changes, and need to see a primary md  Past Medical History  Diagnosis Date  . Hyperlipidemia   . Hypertension   . Diabetes mellitus   . Deaf     Lives with husband who is apparently also non-verbal. Has a daughter who can help with medicines and directions [name] [phone]) but is sometimes overwhelmed by the patient's issues.  . Bilateral leg edema   . Psychiatric disorder     She frequently exhibits paranoia and has been diagnosed with psychotic d/o NOS during hospital stay in the past. She is tangetial and perseverative during her visits. She apparently has had a bad experience with mental health in [place] in the past and refuses to discuss mental issues for fear that she will be sent back there. Her paranoia, communication issues, financial woes and lack of fam  . Fibroid uterus   . Anemia     Due to fibroids.    History reviewed. No pertinent past surgical history.  No family history on file.  History  Substance Use Topics  . Smoking status: Never Smoker   . Smokeless tobacco: Not on file  . Alcohol Use: No    OB History    Grav Para Term Preterm Abortions TAB SAB Ect Mult Living                  Review of Systems All other systems negative   Allergies  Ace inhibitors  Home Medications   Current Outpatient Rx  Name Route Sig Dispense Refill  . FERROUS GLUCONATE 325 MG PO TABS Oral Take 325 mg by mouth daily with breakfast.      . MULTI-VITAMIN/MINERALS PO TABS Oral Take 1 tablet by  mouth daily.    Marland Kitchen HYDROCHLOROTHIAZIDE 25 MG PO TABS Oral Take 1 tablet (25 mg total) by mouth daily. 30 tablet 0  . METOPROLOL TARTRATE 100 MG PO TABS Oral Take 0.5 tablets (50 mg total) by mouth 2 (two) times daily. 60 tablet 0    BP 218/105  Pulse 100  Temp(Src) 97.8 F (36.6 C) (Oral)  Resp 18  SpO2 99%  Physical Exam  Nursing note and vitals reviewed. Constitutional: She is oriented to person, place, and time. She appears well-developed and well-nourished. No distress.  HENT:  Head: Normocephalic and atraumatic.  Eyes: Pupils are equal, round, and reactive to light.  Neck: Normal range of motion.  Cardiovascular: Normal rate and intact distal pulses.   Pulmonary/Chest: No respiratory distress. She has no wheezes.  Abdominal: Normal appearance. She exhibits no distension.  Musculoskeletal: She exhibits edema. She exhibits no tenderness.  Neurological: She is alert and oriented to person, place, and time. No cranial nerve deficit.  Skin: Skin is warm and dry. No rash noted.  Psychiatric: She has a normal mood and affect. Her behavior is normal.    ED Course  Procedures (including critical care time)  Labs Reviewed  COMPREHENSIVE METABOLIC  PANEL - Abnormal; Notable for the following:    Glucose, Bld 258 (*)    Albumin 2.4 (*)    Alkaline Phosphatase 121 (*)    GFR calc non Af Amer 79 (*)    All other components within normal limits  CBC - Abnormal; Notable for the following:    RBC 3.70 (*)    Hemoglobin 11.0 (*)    HCT 32.7 (*)    Platelets 440 (*)    All other components within normal limits  PRO B NATRIURETIC PEPTIDE - Abnormal; Notable for the following:    Pro B Natriuretic peptide (BNP) 258.1 (*)    All other components within normal limits  GLUCOSE, CAPILLARY - Abnormal; Notable for the following:    Glucose-Capillary 241 (*)    All other components within normal limits  DIFFERENTIAL   No results found.   1. Hypertension       MDM           Dot Lanes, MD 04/17/12 1241

## 2012-04-17 NOTE — ED Notes (Signed)
Pt undressed, in gown, family at bedside; pt is deaf

## 2012-04-17 NOTE — ED Notes (Signed)
Patient has not seen a primary md for at least one year.  She has hx of diabetes and htn.  She is also deaf.  We have called for an interpretor.  Patient complains of leg swelling, feet swelling, vision changes, and need to see a primary md

## 2012-04-17 NOTE — ED Notes (Signed)
Assisted Audie Pinto, MD with pt

## 2012-04-17 NOTE — ED Notes (Signed)
Pt. Is here due to daughters concern of pt.s non-compliance with her medications and doctor's visit.  Pt. 's daughter reports that pt. Begins to take her meds for a while and then stops.  She also will go to [facility] and see 1 doctor, not like them and then see another and the behavior continues.  Pt. 's daughter is concerned that pt. Is having blurred vision and also bilateral swollen legs.  Pt. Denies any pain or discomfort at present time,  She is alert and oriented X3,  She communicates by signing and interrupter is at the bedside.  Pt. Ambulates without difficulty. No s/s of hypoglycemia / hyperglycemia

## 2012-04-17 NOTE — ED Notes (Signed)
Translator at bedside with patient.   Tried to call patients daughter, no answer.  Patient's discharge on hold until daughter contacted.  Left message on daughters voicemail.

## 2012-04-17 NOTE — Discharge Instructions (Signed)
RESOURCE GUIDE  Dental Problems  Patients with Medicaid: [place] [employer] Phone:  [phone]                                                  Phone:  [phone]  If unable to pay or uninsured, contact:  [facility] or [facility]. to become qualified for the adult dental clinic.  Chronic Pain Problems Contact [facility]  [phone] Patients need to be referred by their primary care doctor.  Insufficient Money for Medicine Contact [employer]:  call "211" or [place] [phone].  No Primary Care Doctor Call [place] Other agencies that provide inexpensive medical care    [place]  [id number]    [facility]  [place]  [phone]    [facility]  [phone]    [facility]  [phone]    [facility]  [phone]

## 2012-04-17 NOTE — ED Notes (Signed)
BENITA (daughter) cell # [place]

## 2012-05-01 ENCOUNTER — Inpatient Hospital Stay (HOSPITAL_COMMUNITY): Payer: Medicare Other

## 2012-05-01 ENCOUNTER — Inpatient Hospital Stay (HOSPITAL_COMMUNITY)
Admission: AD | Admit: 2012-05-01 | Discharge: 2012-05-01 | Disposition: A | Discharge status: Home / Self Care | Payer: Medicare Other | Source: Ambulatory Visit | Attending: Obstetrics & Gynecology | Admitting: Obstetrics & Gynecology

## 2012-05-01 ENCOUNTER — Encounter (HOSPITAL_COMMUNITY): Payer: Self-pay | Admitting: *Deleted

## 2012-05-01 DIAGNOSIS — K6389 Other specified diseases of intestine: Secondary | ICD-10-CM | POA: Diagnosis not present

## 2012-05-01 DIAGNOSIS — E119 Type 2 diabetes mellitus without complications: Secondary | ICD-10-CM | POA: Diagnosis not present

## 2012-05-01 DIAGNOSIS — K5900 Constipation, unspecified: Secondary | ICD-10-CM | POA: Insufficient documentation

## 2012-05-01 DIAGNOSIS — D649 Anemia, unspecified: Secondary | ICD-10-CM | POA: Diagnosis not present

## 2012-05-01 DIAGNOSIS — H9190 Unspecified hearing loss, unspecified ear: Secondary | ICD-10-CM | POA: Diagnosis not present

## 2012-05-01 DIAGNOSIS — D259 Leiomyoma of uterus, unspecified: Secondary | ICD-10-CM | POA: Diagnosis not present

## 2012-05-01 DIAGNOSIS — I10 Essential (primary) hypertension: Secondary | ICD-10-CM | POA: Diagnosis not present

## 2012-05-01 HISTORY — DX: Cardiac murmur, unspecified: R01.1

## 2012-05-01 LAB — GLUCOSE, CAPILLARY: Glucose-Capillary: 210 mg/dL — ABNORMAL HIGH (ref 70–99)

## 2012-05-01 MED ORDER — LABETALOL HCL 100 MG PO TABS
200.0000 mg | ORAL_TABLET | Freq: Once | ORAL | Status: AC
  Administered 2012-05-01: 200 mg via ORAL
  Filled 2012-05-01: qty 2

## 2012-05-01 MED ORDER — LABETALOL HCL 200 MG PO TABS: 200.0000 mg | ORAL_TABLET | Freq: Two times a day (BID) | ORAL | Status: DC

## 2012-05-01 MED ORDER — POLYETHYLENE GLYCOL 3350 17 G PO PACK: 17.0000 g | PACK | Freq: Every day | ORAL | Status: AC

## 2012-05-01 MED ORDER — POLYETHYLENE GLYCOL 3350 17 G PO PACK
17.0000 g | PACK | Freq: Once | ORAL | Status: AC
  Administered 2012-05-01: 17 g via ORAL
  Filled 2012-05-01: qty 1

## 2012-05-01 NOTE — MAU Note (Signed)
Large BM  Resulted from soap suds enema.

## 2012-05-01 NOTE — MAU Provider Note (Signed)
Attestation of Attending Supervision of Advanced Practitioner: Evaluation and management procedures were performed by the [facility] Fellow/PA/CNM/NP under my supervision and collaboration. Chart reviewed, and agree with management and plan.  Verita Schneiders, M.D. 05/01/2012 9:11 PM

## 2012-05-01 NOTE — MAU Note (Signed)
RN unable to assess pt anymore without interpreter. Pt taken to room 8 in MAU for further triage/eval. Interpreter to be in MAU in 30 minutes.

## 2012-05-01 NOTE — MAU Note (Signed)
Pt to radiology via wheelchair.  Interpreter with pt.

## 2012-05-01 NOTE — MAU Note (Signed)
Pt reports abd pain since Monday that worsened today.  Feeling constipation, small bowel movements since Monday.  Abd bloated, denies vomiting.  Started taking new medication 2 wks ago, unsure of reason but feels it was the wrong medication.

## 2012-05-01 NOTE — MAU Provider Note (Addendum)
Chief Complaint:  Constipation    [name] is  56 y.o. No obstetric history on file..  No LMP recorded..  She presents complaining of Constipation . Onset is described as ongoing and has been present since Monday.  Obstetrical/Gynecological History: Pertinent Gynecological History: Menses: post-menopausal  Past Medical History: Past Medical History  Diagnosis Date  . Hyperlipidemia   . Hypertension   . Diabetes mellitus   . Deaf     Lives with husband who is apparently also non-verbal. Has a daughter who can help with medicines and directions [name] [phone]) but is sometimes overwhelmed by the patient's issues.  . Bilateral leg edema   . Psychiatric disorder     She frequently exhibits paranoia and has been diagnosed with psychotic d/o NOS during hospital stay in the past. She is tangetial and perseverative during her visits. She apparently has had a bad experience with mental health in [place] in the past and refuses to discuss mental issues for fear that she will be sent back there. Her paranoia, communication issues, financial woes and lack of fam  . Fibroid uterus   . Anemia     Due to fibroids.  . Heart murmur     Past Surgical History: Past Surgical History  Procedure Date  . No past surgeries     Family History: History reviewed. No pertinent family history.  Social History: History  Substance Use Topics  . Smoking status: Never Smoker   . Smokeless tobacco: Not on file  . Alcohol Use: No    Allergies:  Allergies  Allergen Reactions  . Ace Inhibitors Cough    Prescriptions prior to admission  Medication Sig Dispense Refill  . ferrous gluconate (FERGON) 325 MG tablet Take 325 mg by mouth daily with breakfast.        . hydrochlorothiazide (HYDRODIURIL) 25 MG tablet Take 1 tablet (25 mg total) by mouth daily.  30 tablet  0  . metoprolol (LOPRESSOR) 100 MG tablet Take 0.5 tablets (50 mg total) by mouth 2 (two) times daily.  60 tablet  0  .  Multiple Vitamins-Minerals (MULTIVITAMIN WITH MINERALS) tablet Take 1 tablet by mouth daily.        Review of Systems - History obtained from the patient and sign language interpreptor Breast ROS: negative Respiratory ROS: no cough, shortness of breath, or wheezing Cardiovascular ROS: no chest pain or dyspnea on exertion Gastrointestinal ROS: positive for - abdominal pain, change in bowel habits, constipation and gas/bloating negative for - diarrhea, nausea/vomiting or stool incontinence Genito-Urinary ROS: no dysuria, trouble voiding, or hematuria Musculoskeletal ROS: positive for - swelling in shin - right, lower  Physical Exam   Blood pressure 246/104, pulse 93, resp. rate 16.  General: General appearance - alert, well appearing, and in no distress, oriented to person, place, and time, overweight and mute/deaf Mental status - alert, oriented to person, place, and time, normal mood, behavior, speech, dress, motor activity, and thought processes Eyes - left eye normal, right eye normal Ears - bilateral TM's and external ear canals normal, not examined Nose - normal and patent, no erythema, discharge or polyps Mouth - mucous membranes moist, pharynx normal without lesions Neck - supple, no significant adenopathy Lymphatics - no palpable lymphadenopathy, no hepatosplenomegaly Chest - clear to auscultation, no wheezes, rales or rhonchi, symmetric air entry Heart - normal rate and regular rhythm, S1 and S2 normal Abdomen - abd distended, tympanic, hypotonic BSx4 Extremities - intact peripheral pulses, abnormal exam of right lower  ext, 3+ edema, hyperpigmentation of right ankle and shin with shiny skin, warm to touch Focused Gynecological Exam: examination not indicated  Labs: No results found for this or any previous visit (from the past 24 hour(s)). Imaging Studies:  No results found.   Assessment:   Plan: Report given and care assumed by Hansel Feinstein, CNM  [place] 05/01/2012,8:11 PM    KUB result:  Dg Abd 1 View  05/01/2012  *RADIOLOGY REPORT*  Clinical Data: Constipation  ABDOMEN - 1 VIEW  Comparison: None.  Findings: Stomach is mildly distended by gas.  There are multiple gas distended small bowel loops in the mid abdomen.  There is a large amount of fecal material in the proximal and mid colon; the sigmoid colon and rectum are decompressed.  Left pelvic calcifications suggest    degenerated uterine fibroids.  Regional bones unremarkable.  IMPRESSION:  1. Copious fecal material in the proximal colon, decompressed distally, with mild gaseous distention of the small bowel, suggesting fecal obstipation.  Original Report Authenticated By: Trecia Rogers, M.D.   Will give Miralax and SSEnema. Filed Vitals:   05/01/12 1852 05/01/12 1928 05/01/12 2101  BP: 220/87 246/104 198/93  Pulse: 88 93 92  TempSrc: Oral    Resp: 16 16    Good results from enema. Feels better. Wants to go home.  Will Rx Labetalol 200mg  bid. Stop Lopressor per Nancee Liter FNP Pt is wanting to find a new doctor.  One month supply given

## 2012-05-01 NOTE — MAU Note (Signed)
Pt deaf, having constipation, has had x3 days. Not eaten lunch or dinner. Awaiting interpreter to come help triage pt.

## 2012-05-01 NOTE — Discharge Instructions (Signed)
Hypertension As your heart beats, it forces blood through your arteries. This force is your blood pressure. If the pressure is too high, it is called hypertension (HTN) or high blood pressure. HTN is dangerous because you may have it and not know it. High blood pressure may mean that your heart has to work harder to pump blood. Your arteries may be narrow or stiff. The extra work puts you at risk for heart disease, stroke, and other problems.  Blood pressure consists of two numbers, a higher number over a lower, 110/72, for example. It is stated as "110 over 72." The ideal is below 120 for the top number (systolic) and under 80 for the bottom (diastolic). Write down your blood pressure today. You should pay close attention to your blood pressure if you have certain conditions such as:  Heart failure.   Prior heart attack.   Diabetes   Chronic kidney disease.   Prior stroke.   Multiple risk factors for heart disease.  To see if you have HTN, your blood pressure should be measured while you are seated with your arm held at the level of the heart. It should be measured at least twice. A one-time elevated blood pressure reading (especially in the Emergency Department) does not mean that you need treatment. There may be conditions in which the blood pressure is different between your right and left arms. It is important to see your caregiver soon for a recheck. Most people have essential hypertension which means that there is not a specific cause. This type of high blood pressure may be lowered by changing lifestyle factors such as:  Stress.   Smoking.   Lack of exercise.   Excessive weight.   Drug/tobacco/alcohol use.   Eating less salt.  Most people do not have symptoms from high blood pressure until it has caused damage to the body. Effective treatment can often prevent, delay or reduce that damage. TREATMENT  When a cause has been identified, treatment for high blood pressure is  directed at the cause. There are a large number of medications to treat HTN. These fall into several categories, and your caregiver will help you select the medicines that are best for you. Medications may have side effects. You should review side effects with your caregiver. If your blood pressure stays high after you have made lifestyle changes or started on medicines,   Your medication(s) may need to be changed.   Other problems may need to be addressed.   Be certain you understand your prescriptions, and know how and when to take your medicine.   Be sure to follow up with your caregiver within the time frame advised (usually within two weeks) to have your blood pressure rechecked and to review your medications.   If you are taking more than one medicine to lower your blood pressure, make sure you know how and at what times they should be taken. Taking two medicines at the same time can result in blood pressure that is too low.  SEEK IMMEDIATE MEDICAL CARE IF:  You develop a severe headache, blurred or changing vision, or confusion.   You have unusual weakness or numbness, or a faint feeling.   You have severe chest or abdominal pain, vomiting, or breathing problems.  MAKE SURE YOU:   Understand these instructions.   Will watch your condition.   Will get help right away if you are not doing well or get worse.  Document Released: 12/11/2005 Document Revised: 11/30/2011 Document  Reviewed: 07/31/2008 [facility] Patient Information 2012 [place].Constipation in Adults Constipation is having fewer than 2 bowel movements per week. Usually, the stools are hard. As we grow older, constipation is more common. If you try to fix constipation with laxatives, the problem may get worse. This is because laxatives taken over a [place] period of time make the colon muscles weaker. A low-fiber diet, not taking in enough fluids, and taking some medicines may make these problems worse. MEDICATIONS THAT  MAY CAUSE CONSTIPATION  Water pills (diuretics).   Calcium channel blockers (used to control blood pressure and for the heart).   Certain pain medicines (narcotics).   Anticholinergics.   Anti-inflammatory agents.   Antacids that contain aluminum.  DISEASES THAT CONTRIBUTE TO CONSTIPATION  Diabetes.   Parkinson's disease.   Dementia.   Stroke.   Depression.   Illnesses that cause problems with salt and water metabolism.  HOME CARE INSTRUCTIONS   Constipation is usually best cared for without medicines. Increasing dietary fiber and eating more fruits and vegetables is the best way to manage constipation.   Slowly increase fiber intake to 25 to 38 grams per day. Whole grains, fruits, vegetables, and legumes are good sources of fiber. A dietitian can further help you incorporate high-fiber foods into your diet.   Drink enough water and fluids to keep your urine clear or pale yellow.   A fiber supplement may be added to your diet if you cannot get enough fiber from foods.   Increasing your activities also helps improve regularity.   Suppositories, as suggested by your caregiver, will also help. If you are using antacids, such as aluminum or calcium containing products, it will be helpful to switch to products containing magnesium if your caregiver says it is okay.   If you have been given a liquid injection (enema) today, this is only a temporary measure. It should not be relied on for treatment of longstanding (chronic) constipation.   Stronger measures, such as magnesium sulfate, should be avoided if possible. This may cause uncontrollable diarrhea. Using magnesium sulfate may not allow you time to make it to the bathroom.  SEEK IMMEDIATE MEDICAL CARE IF:   There is bright red blood in the stool.   The constipation stays for more than 4 days.   There is belly (abdominal) or rectal pain.   You do not seem to be getting better.   You have any questions or concerns.    MAKE SURE YOU:   Understand these instructions.   Will watch your condition.   Will get help right away if you are not doing well or get worse.  Document Released: 09/08/2004 Document Revised: 11/30/2011 Document Reviewed: 11/14/2011 [facility] Patient Information 2012 [place], [place].

## 2012-05-01 NOTE — MAU Note (Signed)
Return from Radiology.

## 2012-05-02 NOTE — MAU Provider Note (Signed)
Attestation of Attending Supervision of Advanced Practitioner: Evaluation and management procedures were performed by the [facility] Fellow/PA/CNM/NP under my supervision and collaboration. Chart reviewed, and agree with management and plan.  Verita Schneiders, M.D. 05/02/2012 12:14 AM

## 2012-06-03 ENCOUNTER — Emergency Department (HOSPITAL_COMMUNITY)
Admission: EM | Admit: 2012-06-03 | Discharge: 2012-06-03 | Disposition: A | Discharge status: Home / Self Care | Payer: Medicare Other | Source: Home / Self Care

## 2012-06-03 ENCOUNTER — Encounter (INDEPENDENT_AMBULATORY_CARE_PROVIDER_SITE_OTHER): Payer: Medicare Other | Admitting: Ophthalmology

## 2012-06-03 DIAGNOSIS — E11359 Type 2 diabetes mellitus with proliferative diabetic retinopathy without macular edema: Secondary | ICD-10-CM

## 2012-06-03 DIAGNOSIS — H43819 Vitreous degeneration, unspecified eye: Secondary | ICD-10-CM | POA: Diagnosis not present

## 2012-06-03 DIAGNOSIS — H4310 Vitreous hemorrhage, unspecified eye: Secondary | ICD-10-CM | POA: Diagnosis not present

## 2012-06-03 DIAGNOSIS — H2510 Age-related nuclear cataract, unspecified eye: Secondary | ICD-10-CM

## 2012-06-03 DIAGNOSIS — E1139 Type 2 diabetes mellitus with other diabetic ophthalmic complication: Secondary | ICD-10-CM

## 2012-06-17 ENCOUNTER — Ambulatory Visit (INDEPENDENT_AMBULATORY_CARE_PROVIDER_SITE_OTHER): Payer: Medicare Other | Admitting: Ophthalmology

## 2012-06-26 ENCOUNTER — Emergency Department (HOSPITAL_COMMUNITY)
Admission: EM | Admit: 2012-06-26 | Discharge: 2012-06-26 | Disposition: A | Discharge status: Home / Self Care | Payer: Medicare Other | Attending: Emergency Medicine | Admitting: Emergency Medicine

## 2012-06-26 ENCOUNTER — Ambulatory Visit (INDEPENDENT_AMBULATORY_CARE_PROVIDER_SITE_OTHER): Payer: Medicare Other | Admitting: Ophthalmology

## 2012-06-26 ENCOUNTER — Other Ambulatory Visit: Payer: Self-pay

## 2012-06-26 ENCOUNTER — Encounter (HOSPITAL_COMMUNITY): Payer: Self-pay | Admitting: Emergency Medicine

## 2012-06-26 DIAGNOSIS — M7989 Other specified soft tissue disorders: Secondary | ICD-10-CM | POA: Diagnosis not present

## 2012-06-26 DIAGNOSIS — D649 Anemia, unspecified: Secondary | ICD-10-CM | POA: Insufficient documentation

## 2012-06-26 DIAGNOSIS — H9190 Unspecified hearing loss, unspecified ear: Secondary | ICD-10-CM | POA: Insufficient documentation

## 2012-06-26 DIAGNOSIS — E119 Type 2 diabetes mellitus without complications: Secondary | ICD-10-CM | POA: Diagnosis not present

## 2012-06-26 DIAGNOSIS — I10 Essential (primary) hypertension: Secondary | ICD-10-CM | POA: Diagnosis not present

## 2012-06-26 DIAGNOSIS — R609 Edema, unspecified: Secondary | ICD-10-CM | POA: Diagnosis not present

## 2012-06-26 DIAGNOSIS — E669 Obesity, unspecified: Secondary | ICD-10-CM | POA: Insufficient documentation

## 2012-06-26 DIAGNOSIS — M79609 Pain in unspecified limb: Secondary | ICD-10-CM | POA: Diagnosis not present

## 2012-06-26 DIAGNOSIS — E785 Hyperlipidemia, unspecified: Secondary | ICD-10-CM | POA: Diagnosis not present

## 2012-06-26 LAB — BASIC METABOLIC PANEL
BUN: 13 mg/dL (ref 6–23)
CO2: 26 mEq/L (ref 19–32)
Calcium: 8.6 mg/dL (ref 8.4–10.5)
Chloride: 104 mEq/L (ref 96–112)
Creatinine, Ser: 0.79 mg/dL (ref 0.50–1.10)
GFR calc Af Amer: >90 mL/min (ref >90)
GFR calc non Af Amer: >90 mL/min (ref >90)
Glucose, Bld: 166 mg/dL — ABNORMAL HIGH (ref 70–99)
Potassium: 3.8 mEq/L (ref 3.5–5.1)
Sodium: 137 mEq/L (ref 135–145)

## 2012-06-26 LAB — CBC
HCT: 28.9 % — ABNORMAL LOW (ref 36.0–46.0)
Hemoglobin: 9.5 g/dL — ABNORMAL LOW (ref 12.0–15.0)
MCH: 29 pg (ref 26.0–34.0)
MCHC: 32.9 g/dL (ref 30.0–36.0)
MCV: 88.1 fL (ref 78.0–100.0)
Platelets: 332 10*3/uL (ref 150–400)
RBC: 3.28 MIL/uL — ABNORMAL LOW (ref 3.87–5.11)
RDW: 12.6 % (ref 11.5–15.5)
WBC: 6 10*3/uL (ref 4.0–10.5)

## 2012-06-26 LAB — GLUCOSE, CAPILLARY: Glucose-Capillary: 142 mg/dL — ABNORMAL HIGH (ref 70–99)

## 2012-06-26 MED ORDER — LABETALOL HCL 200 MG PO TABS: 200.0000 mg | ORAL_TABLET | Freq: Two times a day (BID) | ORAL | Status: DC

## 2012-06-26 MED ORDER — AMLODIPINE BESYLATE 5 MG PO TABS
5.0000 mg | ORAL_TABLET | Freq: Once | ORAL | Status: AC
  Administered 2012-06-26: 5 mg via ORAL
  Filled 2012-06-26: qty 1

## 2012-06-26 MED ORDER — HYDROCHLOROTHIAZIDE 25 MG PO TABS: 25.0000 mg | ORAL_TABLET | Freq: Every day | ORAL | Status: DC

## 2012-06-26 NOTE — Progress Notes (Signed)
EDP, Hillard Danker consulted ED CM to assist with pcp referral for pt CM called [facility] for assistance Pending a return call from health connect CM found list of medicare providers on [email or web address].biz/.  Found list of providers with zip code [phone] to offer to pt CM met with pt who referred CM to her daughter Pt indicated she has difficulty hearing, seeing and speaking.  Wrote CM note and referred to dtr.  CM left Dtr message at [phone] Return call from [facility] Spoke with panise who stated list not available other than medicare.gov Listed office at [address] [place] [id number] Office is closed July 3-4, 2013 Pending return call from dtr Updated EDP on status

## 2012-06-26 NOTE — ED Provider Notes (Signed)
History     CSN: [id number]  Arrival date & time 06/26/12  1012   First MD Initiated Contact with Patient 06/26/12 1031      Chief complaint evaluate blood pressure and diabetes HPI The patient was seen and ophthalmologist today for evaluation. The patient has history of hypertension and diabetes but has not been seeing a doctor for at least the last 2-3 years. She had been going to the family practice Center at [facility] but apparently the patient became unhappy with the doctor at that facility. The daughter states that her mother just stopped going and has not been taking any medications regularly.  When she saw the eye doctor today she was told that she needed to get her blood pressure and diabetes controlled first before they can treat her eyes appropriately. Patient then came to the emergency room for evaluation. She denies any chest pain, headache, numbness or weakness, or shortness of breath. She has no acute complaints today. Patient last ate last evening. She chronically has been having some trouble with swelling in both of her legs but greater on the right. The patient states she is not sure but she might have had a blood clot in that leg in the past. She also has some soreness in the left foot as well. Past Medical History  Diagnosis Date  . Hyperlipidemia   . Hypertension   . Diabetes mellitus   . Deaf     Lives with husband who is apparently also non-verbal. Has a daughter who can help with medicines and directions [name] [phone]) but is sometimes overwhelmed by the patient's issues.  . Bilateral leg edema   . Psychiatric disorder     She frequently exhibits paranoia and has been diagnosed with psychotic d/o NOS during hospital stay in the past. She is tangetial and perseverative during her visits. She apparently has had a bad experience with mental health in [place] in the past and refuses to discuss mental issues for fear that she will be sent back there. Her paranoia,  communication issues, financial woes and lack of fam  . Fibroid uterus   . Anemia     Due to fibroids.  . Heart murmur     Past Surgical History  Procedure Date  . No past surgeries     No family history on file.  History  Substance Use Topics  . Smoking status: Never Smoker   . Smokeless tobacco: Not on file  . Alcohol Use: No    OB History    Grav Para Term Preterm Abortions TAB SAB Ect Mult Living                  Review of Systems  All other systems reviewed and are negative.    Allergies  Ace inhibitors  Home Medications   Current Outpatient Rx  Name Route Sig Dispense Refill  . FERROUS GLUCONATE 325 MG PO TABS Oral Take 325 mg by mouth daily with breakfast.     . HYDROCHLOROTHIAZIDE 25 MG PO TABS Oral Take 25 mg by mouth daily.    Marland Kitchen LABETALOL HCL 200 MG PO TABS Oral Take 1 tablet (200 mg total) by mouth 2 (two) times daily. 60 tablet 0  . MULTI-VITAMIN/MINERALS PO TABS Oral Take 1 tablet by mouth daily.      BP 216/100  Pulse 100  Temp 97.9 F (36.6 C) (Oral)  Resp 16  SpO2 100%  Physical Exam  Nursing note and vitals reviewed. Constitutional:  She appears well-developed and well-nourished. No distress.       Obese  HENT:  Head: Normocephalic and atraumatic.  Right Ear: External ear normal.  Left Ear: External ear normal.  Eyes: Conjunctivae are normal. Right eye exhibits no discharge. Left eye exhibits no discharge. No scleral icterus.  Neck: Neck supple. No tracheal deviation present.  Cardiovascular: Normal rate, regular rhythm and intact distal pulses.   Pulmonary/Chest: Effort normal and breath sounds normal. No stridor. No respiratory distress. She has no wheezes. She has no rales.  Abdominal: Soft. Bowel sounds are normal. She exhibits no distension. There is no tenderness. There is no rebound and no guarding.  Musculoskeletal: She exhibits edema (edema greater on the right lower extremity, no erythema, no significant tenderness, distal  neurovascular intact bilaterally). She exhibits no tenderness (mild tenderness dorsum left foot, plantar wart noted).  Neurological: She is alert. She has normal strength. No cranial nerve deficit (Except the patient is deaf) or sensory deficit. She exhibits normal muscle tone. She displays no seizure activity. Coordination normal.  Skin: Skin is warm and dry. No rash noted.  Psychiatric: She has a normal mood and affect.    ED Course  Procedures (including critical care time)  Labs Reviewed  GLUCOSE, CAPILLARY - Abnormal; Notable for the following:    Glucose-Capillary 142 (*)     All other components within normal limits  CBC - Abnormal; Notable for the following:    RBC 3.28 (*)     Hemoglobin 9.5 (*)     HCT 28.9 (*)     All other components within normal limits  BASIC METABOLIC PANEL - Abnormal; Notable for the following:    Glucose, Bld 166 (*)     All other components within normal limits   No results found. Right lower extremity venous duplex completed.  Right lower extremity is negative for deep vein thrombosis.  06/26/2012 12:13 PM  Maudry Mayhew, RDMS, RDCS        1. Edema   2. HTN (hypertension)   3. Anemia       MDM  Patient appears to be relatively asymptomatic. She is undergoing history of hypertension but has been noncompliant. Her blood sugar is only mildly elevated today. She does not require any acute emergency treatment for that at this time. I discussed with the patient's daughter the importance of finding a primary care Dr. Burnis Medin screen for any acute emergency medical issues today. The patient does have significant left swelling at apparently has been ongoing for months if not years. With the questionable history of DVT in the past old ultrasound of her right lower extremity to exclude an acute deep venous thrombosis.  Pt also noted to have anemia that is increasing compared to old labs.  No complaints of blood in her stool or dark stools.  Explained  findings to patient.  outpt follow up is indicated.   OVerall, pt has several chronic conditions that require management.  She has been noncomplaint.  Will provider her resources to help find a PCP.  Will rx blood pressure medications.  Discussed with case management and Education officer, museum.  They will try to help establish care with a PCP.         Kathalene Frames, MD 06/26/12 [phone]

## 2012-06-26 NOTE — ED Notes (Signed)
Patient's blood pressure was reported to Dr Hillard Danker.

## 2012-06-26 NOTE — ED Notes (Signed)
Reviewed d/c instructions with daughter. Kim with care management in to see pt/family to discuss finding PCP.

## 2012-06-26 NOTE — ED Notes (Signed)
Pt has been off HCTZ over 3 months now.

## 2012-06-26 NOTE — Progress Notes (Signed)
*  Preliminary Results* Right lower extremity venous duplex completed. Right lower extremity is negative for deep vein thrombosis.  06/26/2012 12:13 PM Maudry Mayhew, RDMS, RDCS

## 2012-06-26 NOTE — Progress Notes (Signed)
ED Cm received a call from pt's daughter about needed pcp and appointment Dtr prefers MD within 10 miles of home, on [address] and during the mornings CM provided instructions to daughter on how to obtain a view of medicare.gov accepting pcps using her telephone internet or [employer]

## 2012-06-26 NOTE — ED Notes (Signed)
RN Laddie Aquas confirmed pt. could ambulate to restroom -- Advised ultrasound was negative for DVTs

## 2012-06-26 NOTE — ED Notes (Signed)
Pt was sent here from her Dr Tempie Hoist her eye doctor due to concerns about possible high blood sugar and the need for a referral to PCP for diabetes management. Pt CBG 142 in triage. Pt BP in triage was found to be 218/93 and confirmed manually. Pt in no acute distress. Pt is hearing impaired and daughter is with her.

## 2012-06-26 NOTE — ED Notes (Signed)
Pt has swelling to right lower foot with pain. Pt able to ambulate.

## 2012-06-28 NOTE — Progress Notes (Signed)
ED CM  Left voice message for [place],Benita (daughter) at [phone] to inform her that CM continues to seek pt pcp appointment and will return call soon CM left office contact number if concerns

## 2012-07-02 NOTE — Progress Notes (Signed)
ED CM received a voice message from [place], pt's daughter stating she is not sure if her mother will go to scheduled appointment Reports non compliance with medications per pt since left ED.  She is requesting cancellation of appointment until pt is willing to be more compliant.  CM left Bonita a voice message at [phone] to inform her CM would cancel 07/03/12 0845 appt with Dr Criss Rosales.  CM contacted Dr Criss Rosales office at [phone], spoke with [facility] and cancelled appt

## 2012-07-02 NOTE — Progress Notes (Signed)
ED Cm able to obtain an appointment for pt on July 01, 2012 at [facility] with Dr Lucianne Lei [address]; [address] [place], [place]) [phone] (spoke with shanita) Left voice message for Doroteo Bradford Daughter at [phone] Requested return call to [phone] to confirm appointment

## 2012-07-26 DIAGNOSIS — I10 Essential (primary) hypertension: Secondary | ICD-10-CM | POA: Diagnosis not present

## 2012-07-26 DIAGNOSIS — IMO0001 Reserved for inherently not codable concepts without codable children: Secondary | ICD-10-CM | POA: Diagnosis not present

## 2012-07-26 DIAGNOSIS — R609 Edema, unspecified: Secondary | ICD-10-CM | POA: Diagnosis not present

## 2012-07-26 DIAGNOSIS — L97909 Non-pressure chronic ulcer of unspecified part of unspecified lower leg with unspecified severity: Secondary | ICD-10-CM | POA: Diagnosis not present

## 2012-08-08 ENCOUNTER — Encounter (HOSPITAL_BASED_OUTPATIENT_CLINIC_OR_DEPARTMENT_OTHER): Payer: Medicare Other | Attending: Internal Medicine

## 2012-08-08 DIAGNOSIS — I10 Essential (primary) hypertension: Secondary | ICD-10-CM | POA: Insufficient documentation

## 2012-08-08 DIAGNOSIS — L97509 Non-pressure chronic ulcer of other part of unspecified foot with unspecified severity: Secondary | ICD-10-CM | POA: Diagnosis not present

## 2012-08-08 DIAGNOSIS — M7989 Other specified soft tissue disorders: Secondary | ICD-10-CM | POA: Diagnosis not present

## 2012-08-08 DIAGNOSIS — Z79899 Other long term (current) drug therapy: Secondary | ICD-10-CM | POA: Diagnosis not present

## 2012-08-08 DIAGNOSIS — H913 Deaf nonspeaking, not elsewhere classified: Secondary | ICD-10-CM | POA: Insufficient documentation

## 2012-08-08 DIAGNOSIS — E1169 Type 2 diabetes mellitus with other specified complication: Secondary | ICD-10-CM | POA: Insufficient documentation

## 2012-08-08 NOTE — Progress Notes (Signed)
[facility]  NAME:  [name], [name] NO.:  [id number]  MEDICAL RECORD NO.:  [id number]      DATE OF BIRTH:  Nov 08, 1956  PHYSICIAN:  Judene Companion, M.D.           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  But through an interpreter, we found out that she has had problems with swelling of her right leg and has developed a, what looks like, diabetic foot ulcer on the plantar aspect of her right great toe.  This is about a centimeter in diameter.  She was in the hospital not very [place] ago where venous duplex studies were done because of the swelling of her legs and it was normal.  She also is not even sure if she is diabetic.  Today, her blood sugar was elevated to 145, so she is probably a diabetic, but she is not on any medicine or whatsoever.  She has a history of hypertension however and she is on lisinopril for hypertension.  She is not on any other medications.  Today, we found her blood pressure to be 120/80, her temperature is 98.6, and respirations 18.  She is going to have a compression put on this leg and we put a collagen dressing with a toe sock and she will come back in a week and we will re-evaluate her at that time.  I feel this is a diabetic foot ulcer even though she is not on any treatment for diabetes and we may have to make sure that her family doctor is aware of the situation and may want to end of this problem.  So her discharge diagnosis is probable diabetic foot ulcer plantar aspect of right great toe, hypertension and she is also a deaf mute, and we put Androderm on the wound.     Judene Companion, M.D.     PP/MEDQ  D:  08/08/2012  T:  08/08/2012  Job:  [id number]

## 2012-08-16 DIAGNOSIS — I10 Essential (primary) hypertension: Secondary | ICD-10-CM | POA: Diagnosis not present

## 2012-08-16 DIAGNOSIS — M7989 Other specified soft tissue disorders: Secondary | ICD-10-CM | POA: Diagnosis not present

## 2012-08-16 DIAGNOSIS — E1169 Type 2 diabetes mellitus with other specified complication: Secondary | ICD-10-CM | POA: Diagnosis not present

## 2012-08-16 DIAGNOSIS — L97509 Non-pressure chronic ulcer of other part of unspecified foot with unspecified severity: Secondary | ICD-10-CM | POA: Diagnosis not present

## 2012-08-20 ENCOUNTER — Ambulatory Visit: Payer: Medicare Other | Admitting: *Deleted

## 2012-08-23 DIAGNOSIS — M7989 Other specified soft tissue disorders: Secondary | ICD-10-CM | POA: Diagnosis not present

## 2012-08-23 DIAGNOSIS — I10 Essential (primary) hypertension: Secondary | ICD-10-CM | POA: Diagnosis not present

## 2012-08-23 DIAGNOSIS — L97509 Non-pressure chronic ulcer of other part of unspecified foot with unspecified severity: Secondary | ICD-10-CM | POA: Diagnosis not present

## 2012-08-23 DIAGNOSIS — E1169 Type 2 diabetes mellitus with other specified complication: Secondary | ICD-10-CM | POA: Diagnosis not present

## 2012-08-27 ENCOUNTER — Encounter (HOSPITAL_BASED_OUTPATIENT_CLINIC_OR_DEPARTMENT_OTHER): Payer: Medicare Other | Attending: General Surgery

## 2012-08-27 DIAGNOSIS — L84 Corns and callosities: Secondary | ICD-10-CM | POA: Insufficient documentation

## 2012-08-27 DIAGNOSIS — E1169 Type 2 diabetes mellitus with other specified complication: Secondary | ICD-10-CM | POA: Diagnosis not present

## 2012-08-27 DIAGNOSIS — L97509 Non-pressure chronic ulcer of other part of unspecified foot with unspecified severity: Secondary | ICD-10-CM | POA: Insufficient documentation

## 2012-08-27 DIAGNOSIS — M7989 Other specified soft tissue disorders: Secondary | ICD-10-CM | POA: Insufficient documentation

## 2012-08-27 DIAGNOSIS — I10 Essential (primary) hypertension: Secondary | ICD-10-CM | POA: Diagnosis not present

## 2012-08-30 DIAGNOSIS — I10 Essential (primary) hypertension: Secondary | ICD-10-CM | POA: Diagnosis not present

## 2012-08-30 DIAGNOSIS — E1169 Type 2 diabetes mellitus with other specified complication: Secondary | ICD-10-CM | POA: Diagnosis not present

## 2012-08-30 DIAGNOSIS — L84 Corns and callosities: Secondary | ICD-10-CM | POA: Diagnosis not present

## 2012-08-30 DIAGNOSIS — M7989 Other specified soft tissue disorders: Secondary | ICD-10-CM | POA: Diagnosis not present

## 2012-08-30 DIAGNOSIS — L97509 Non-pressure chronic ulcer of other part of unspecified foot with unspecified severity: Secondary | ICD-10-CM | POA: Diagnosis not present

## 2012-08-30 LAB — GLUCOSE, CAPILLARY: Glucose-Capillary: 179 mg/dL — ABNORMAL HIGH (ref 70–99)

## 2012-09-06 DIAGNOSIS — M7989 Other specified soft tissue disorders: Secondary | ICD-10-CM | POA: Diagnosis not present

## 2012-09-06 DIAGNOSIS — I10 Essential (primary) hypertension: Secondary | ICD-10-CM | POA: Diagnosis not present

## 2012-09-06 DIAGNOSIS — L97509 Non-pressure chronic ulcer of other part of unspecified foot with unspecified severity: Secondary | ICD-10-CM | POA: Diagnosis not present

## 2012-09-06 DIAGNOSIS — L84 Corns and callosities: Secondary | ICD-10-CM | POA: Diagnosis not present

## 2012-09-06 DIAGNOSIS — E1169 Type 2 diabetes mellitus with other specified complication: Secondary | ICD-10-CM | POA: Diagnosis not present

## 2012-09-10 DIAGNOSIS — I10 Essential (primary) hypertension: Secondary | ICD-10-CM | POA: Diagnosis not present

## 2012-09-10 DIAGNOSIS — IMO0001 Reserved for inherently not codable concepts without codable children: Secondary | ICD-10-CM | POA: Diagnosis not present

## 2012-09-12 ENCOUNTER — Encounter (HOSPITAL_BASED_OUTPATIENT_CLINIC_OR_DEPARTMENT_OTHER): Payer: Medicare Other

## 2012-09-13 DIAGNOSIS — E1169 Type 2 diabetes mellitus with other specified complication: Secondary | ICD-10-CM | POA: Diagnosis not present

## 2012-09-13 DIAGNOSIS — M7989 Other specified soft tissue disorders: Secondary | ICD-10-CM | POA: Diagnosis not present

## 2012-09-13 DIAGNOSIS — L84 Corns and callosities: Secondary | ICD-10-CM | POA: Diagnosis not present

## 2012-09-13 DIAGNOSIS — I10 Essential (primary) hypertension: Secondary | ICD-10-CM | POA: Diagnosis not present

## 2012-09-13 DIAGNOSIS — L97509 Non-pressure chronic ulcer of other part of unspecified foot with unspecified severity: Secondary | ICD-10-CM | POA: Diagnosis not present

## 2013-01-01 DIAGNOSIS — M216X9 Other acquired deformities of unspecified foot: Secondary | ICD-10-CM | POA: Diagnosis not present

## 2013-01-01 DIAGNOSIS — Q828 Other specified congenital malformations of skin: Secondary | ICD-10-CM | POA: Diagnosis not present

## 2013-01-01 DIAGNOSIS — B351 Tinea unguium: Secondary | ICD-10-CM | POA: Diagnosis not present

## 2013-01-03 DIAGNOSIS — E11359 Type 2 diabetes mellitus with proliferative diabetic retinopathy without macular edema: Secondary | ICD-10-CM | POA: Diagnosis not present

## 2013-01-03 DIAGNOSIS — H2510 Age-related nuclear cataract, unspecified eye: Secondary | ICD-10-CM | POA: Diagnosis not present

## 2013-01-03 DIAGNOSIS — E1139 Type 2 diabetes mellitus with other diabetic ophthalmic complication: Secondary | ICD-10-CM | POA: Diagnosis not present

## 2013-01-21 DIAGNOSIS — E1139 Type 2 diabetes mellitus with other diabetic ophthalmic complication: Secondary | ICD-10-CM | POA: Diagnosis not present

## 2013-01-21 DIAGNOSIS — H3582 Retinal ischemia: Secondary | ICD-10-CM | POA: Diagnosis not present

## 2013-01-21 DIAGNOSIS — H3340 Traction detachment of retina, unspecified eye: Secondary | ICD-10-CM | POA: Diagnosis not present

## 2013-01-21 DIAGNOSIS — E119 Type 2 diabetes mellitus without complications: Secondary | ICD-10-CM | POA: Diagnosis not present

## 2013-01-21 DIAGNOSIS — E11359 Type 2 diabetes mellitus with proliferative diabetic retinopathy without macular edema: Secondary | ICD-10-CM | POA: Diagnosis not present

## 2013-02-17 DIAGNOSIS — E1139 Type 2 diabetes mellitus with other diabetic ophthalmic complication: Secondary | ICD-10-CM | POA: Diagnosis not present

## 2013-02-17 DIAGNOSIS — H2510 Age-related nuclear cataract, unspecified eye: Secondary | ICD-10-CM | POA: Diagnosis not present

## 2013-02-17 DIAGNOSIS — E11359 Type 2 diabetes mellitus with proliferative diabetic retinopathy without macular edema: Secondary | ICD-10-CM | POA: Diagnosis not present

## 2013-02-24 DIAGNOSIS — E11359 Type 2 diabetes mellitus with proliferative diabetic retinopathy without macular edema: Secondary | ICD-10-CM | POA: Diagnosis not present

## 2013-02-24 DIAGNOSIS — E1139 Type 2 diabetes mellitus with other diabetic ophthalmic complication: Secondary | ICD-10-CM | POA: Diagnosis not present

## 2013-02-24 DIAGNOSIS — Z01810 Encounter for preprocedural cardiovascular examination: Secondary | ICD-10-CM | POA: Diagnosis not present

## 2013-02-24 DIAGNOSIS — Z9119 Patient's noncompliance with other medical treatment and regimen: Secondary | ICD-10-CM | POA: Diagnosis not present

## 2013-02-24 DIAGNOSIS — H2510 Age-related nuclear cataract, unspecified eye: Secondary | ICD-10-CM | POA: Diagnosis not present

## 2013-02-24 DIAGNOSIS — H2589 Other age-related cataract: Secondary | ICD-10-CM | POA: Diagnosis not present

## 2013-02-24 DIAGNOSIS — E119 Type 2 diabetes mellitus without complications: Secondary | ICD-10-CM | POA: Diagnosis not present

## 2013-02-24 DIAGNOSIS — H9190 Unspecified hearing loss, unspecified ear: Secondary | ICD-10-CM | POA: Diagnosis not present

## 2013-02-24 DIAGNOSIS — M7989 Other specified soft tissue disorders: Secondary | ICD-10-CM | POA: Diagnosis not present

## 2013-03-04 DIAGNOSIS — IMO0001 Reserved for inherently not codable concepts without codable children: Secondary | ICD-10-CM | POA: Diagnosis not present

## 2013-03-19 DIAGNOSIS — R809 Proteinuria, unspecified: Secondary | ICD-10-CM | POA: Diagnosis not present

## 2013-03-19 DIAGNOSIS — E7800 Pure hypercholesterolemia, unspecified: Secondary | ICD-10-CM | POA: Diagnosis not present

## 2013-03-19 DIAGNOSIS — IMO0001 Reserved for inherently not codable concepts without codable children: Secondary | ICD-10-CM | POA: Diagnosis not present

## 2013-03-19 DIAGNOSIS — I10 Essential (primary) hypertension: Secondary | ICD-10-CM | POA: Diagnosis not present

## 2013-03-24 DIAGNOSIS — E1165 Type 2 diabetes mellitus with hyperglycemia: Secondary | ICD-10-CM | POA: Diagnosis not present

## 2013-03-24 DIAGNOSIS — E11319 Type 2 diabetes mellitus with unspecified diabetic retinopathy without macular edema: Secondary | ICD-10-CM | POA: Diagnosis not present

## 2013-03-24 DIAGNOSIS — H548 Legal blindness, as defined in USA: Secondary | ICD-10-CM | POA: Diagnosis not present

## 2013-03-24 DIAGNOSIS — I10 Essential (primary) hypertension: Secondary | ICD-10-CM | POA: Diagnosis not present

## 2013-03-24 DIAGNOSIS — E1139 Type 2 diabetes mellitus with other diabetic ophthalmic complication: Secondary | ICD-10-CM | POA: Diagnosis not present

## 2013-03-24 DIAGNOSIS — E785 Hyperlipidemia, unspecified: Secondary | ICD-10-CM | POA: Diagnosis not present

## 2013-03-27 ENCOUNTER — Emergency Department (HOSPITAL_COMMUNITY)
Admission: EM | Admit: 2013-03-27 | Discharge: 2013-03-27 | Disposition: A | Discharge status: Home / Self Care | Payer: Medicare Other | Attending: Emergency Medicine | Admitting: Emergency Medicine

## 2013-03-27 ENCOUNTER — Encounter (HOSPITAL_COMMUNITY): Payer: Self-pay | Admitting: *Deleted

## 2013-03-27 DIAGNOSIS — F489 Nonpsychotic mental disorder, unspecified: Secondary | ICD-10-CM | POA: Insufficient documentation

## 2013-03-27 DIAGNOSIS — I10 Essential (primary) hypertension: Secondary | ICD-10-CM

## 2013-03-27 DIAGNOSIS — H9190 Unspecified hearing loss, unspecified ear: Secondary | ICD-10-CM | POA: Diagnosis not present

## 2013-03-27 DIAGNOSIS — E1165 Type 2 diabetes mellitus with hyperglycemia: Secondary | ICD-10-CM | POA: Diagnosis not present

## 2013-03-27 DIAGNOSIS — E785 Hyperlipidemia, unspecified: Secondary | ICD-10-CM | POA: Insufficient documentation

## 2013-03-27 DIAGNOSIS — H538 Other visual disturbances: Secondary | ICD-10-CM | POA: Diagnosis not present

## 2013-03-27 DIAGNOSIS — Z8679 Personal history of other diseases of the circulatory system: Secondary | ICD-10-CM | POA: Diagnosis not present

## 2013-03-27 DIAGNOSIS — E11319 Type 2 diabetes mellitus with unspecified diabetic retinopathy without macular edema: Secondary | ICD-10-CM | POA: Diagnosis not present

## 2013-03-27 DIAGNOSIS — D649 Anemia, unspecified: Secondary | ICD-10-CM | POA: Insufficient documentation

## 2013-03-27 DIAGNOSIS — H548 Legal blindness, as defined in USA: Secondary | ICD-10-CM | POA: Diagnosis not present

## 2013-03-27 DIAGNOSIS — E1169 Type 2 diabetes mellitus with other specified complication: Secondary | ICD-10-CM | POA: Diagnosis not present

## 2013-03-27 DIAGNOSIS — E1139 Type 2 diabetes mellitus with other diabetic ophthalmic complication: Secondary | ICD-10-CM | POA: Diagnosis not present

## 2013-03-27 DIAGNOSIS — Z79899 Other long term (current) drug therapy: Secondary | ICD-10-CM | POA: Insufficient documentation

## 2013-03-27 DIAGNOSIS — D259 Leiomyoma of uterus, unspecified: Secondary | ICD-10-CM | POA: Diagnosis not present

## 2013-03-27 NOTE — ED Notes (Signed)
Pt's daughter sts Pt used to take HTN medication, but stopped.  Sts just began taking medication again x 2-3 weeks.

## 2013-03-27 NOTE — ED Provider Notes (Signed)
History     CSN: [id number]  Arrival date & time 03/27/13  1548   First MD Initiated Contact with Patient 03/27/13 1828      Chief Complaint  Patient presents with  . Hypertension    (Consider location/radiation/quality/duration/timing/severity/associated sxs/prior treatment) HPI Comments: Patient presents emergency department with chief complaint of hypertension. She is deaf, and was told to come to the emergency department by her home health nurse. The home health nurse, it reportedly took the patient's blood pressure today, and found that it was high, and told her to come to the emergency department. She is not experiencing any symptoms. She denies any headache, or dizziness. She has blurred vision which is normal for her because of her diabetes.  Patient states that she has not had any new changes.  She states that she feels fine.  She is not in any pain.  The history is provided by the patient. No language interpreter was used.    Past Medical History  Diagnosis Date  . Hyperlipidemia   . Hypertension   . Diabetes mellitus   . Deaf     Lives with husband who is apparently also non-verbal. Has a daughter who can help with medicines and directions [name] [phone]) but is sometimes overwhelmed by the patient's issues.  . Bilateral leg edema   . Psychiatric disorder     She frequently exhibits paranoia and has been diagnosed with psychotic d/o NOS during hospital stay in the past. She is tangetial and perseverative during her visits. She apparently has had a bad experience with mental health in [place] in the past and refuses to discuss mental issues for fear that she will be sent back there. Her paranoia, communication issues, financial woes and lack of fam  . Fibroid uterus   . Anemia     Due to fibroids.  . Heart murmur     Past Surgical History  Procedure Laterality Date  . No past surgeries      No family history on file.  History  Substance Use Topics  .  Smoking status: Never Smoker   . Smokeless tobacco: Not on file  . Alcohol Use: No    OB History   Grav Para Term Preterm Abortions TAB SAB Ect Mult Living                  Review of Systems  All other systems reviewed and are negative.    Allergies  Review of patient's allergies indicates no known allergies.  Home Medications   Current Outpatient Rx  Name  Route  Sig  Dispense  Refill  . ferrous sulfate 325 (65 FE) MG tablet   Oral   Take 325 mg by mouth daily with breakfast.         . lisinopril (PRINIVIL,ZESTRIL) 10 MG tablet   Oral   Take 10 mg by mouth daily.         . metFORMIN (GLUCOPHAGE) 500 MG tablet   Oral   Take 500 mg by mouth 2 (two) times daily with a meal.         . Multiple Vitamins-Minerals (MULTIVITAMIN WITH MINERALS) tablet   Oral   Take 1 tablet by mouth daily.         . simvastatin (ZOCOR) 40 MG tablet   Oral   Take 40 mg by mouth every evening.           BP 233/102  Pulse 102  Temp(Src) 98 F (36.7 C) (  Oral)  Resp 18  SpO2 100%  Physical Exam  Nursing note and vitals reviewed. Constitutional: She is oriented to person, place, and time. She appears well-developed and well-nourished.  HENT:  Head: Normocephalic and atraumatic.  Eyes: Conjunctivae and EOM are normal. Pupils are equal, round, and reactive to light.  Neck: Normal range of motion. Neck supple.  Cardiovascular: Normal rate and regular rhythm.  Exam reveals no gallop and no friction rub.   No murmur heard. Pulmonary/Chest: Effort normal and breath sounds normal. No respiratory distress. She has no wheezes. She has no rales. She exhibits no tenderness.  Abdominal: Soft. Bowel sounds are normal. She exhibits no distension and no mass. There is no tenderness. There is no rebound and no guarding.  Musculoskeletal: Normal range of motion. She exhibits no edema and no tenderness.  Neurological: She is alert and oriented to person, place, and time.  Skin: Skin is warm  and dry.  Psychiatric: She has a normal mood and affect. Her behavior is normal. Judgment and thought content normal.    ED Course  Procedures (including critical care time)  Labs Reviewed - No data to display No results found.   1. Hypertension       MDM  Patient with hypertension.  Asymptomatic. Recently started taking BP meds because she is going to have cataract surgery.  She was been poorly controlled.  Her home health nurse saw her BP today and told her to come to the ED.  Again, patient is asymptomatic.  I discussed the patient and her 232/110s BP with Dr. Dorna Mai, who tells me that she can be discharged to home with PCP follow-up.        Montine Circle, PA-C 03/27/13 1949

## 2013-03-27 NOTE — ED Notes (Signed)
Pt has a home health nurse that comes out, today took pts blood pressure and stated it was very high and she needed to come to the ED to be evaluated, pt states she is on 3 blood pressure medications at home and takes them like prescribed, pt is deaf.

## 2013-03-27 NOTE — ED Provider Notes (Signed)
Medical screening examination/treatment/procedure(s) were performed by non-physician practitioner and as supervising physician I was immediately available for consultation/collaboration.   Saddie Benders. Dorna Mai, MD 03/27/13 2214

## 2013-04-01 DIAGNOSIS — I10 Essential (primary) hypertension: Secondary | ICD-10-CM | POA: Diagnosis not present

## 2013-04-02 DIAGNOSIS — E785 Hyperlipidemia, unspecified: Secondary | ICD-10-CM | POA: Diagnosis not present

## 2013-04-02 DIAGNOSIS — H548 Legal blindness, as defined in USA: Secondary | ICD-10-CM | POA: Diagnosis not present

## 2013-04-02 DIAGNOSIS — E1165 Type 2 diabetes mellitus with hyperglycemia: Secondary | ICD-10-CM | POA: Diagnosis not present

## 2013-04-02 DIAGNOSIS — E1139 Type 2 diabetes mellitus with other diabetic ophthalmic complication: Secondary | ICD-10-CM | POA: Diagnosis not present

## 2013-04-02 DIAGNOSIS — E11319 Type 2 diabetes mellitus with unspecified diabetic retinopathy without macular edema: Secondary | ICD-10-CM | POA: Diagnosis not present

## 2013-04-02 DIAGNOSIS — I10 Essential (primary) hypertension: Secondary | ICD-10-CM | POA: Diagnosis not present

## 2013-04-14 DIAGNOSIS — I10 Essential (primary) hypertension: Secondary | ICD-10-CM | POA: Diagnosis not present

## 2013-04-16 DIAGNOSIS — R809 Proteinuria, unspecified: Secondary | ICD-10-CM | POA: Diagnosis not present

## 2013-04-16 DIAGNOSIS — E7800 Pure hypercholesterolemia, unspecified: Secondary | ICD-10-CM | POA: Diagnosis not present

## 2013-04-16 DIAGNOSIS — I10 Essential (primary) hypertension: Secondary | ICD-10-CM | POA: Diagnosis not present

## 2013-04-16 DIAGNOSIS — IMO0001 Reserved for inherently not codable concepts without codable children: Secondary | ICD-10-CM | POA: Diagnosis not present

## 2013-04-17 DIAGNOSIS — R809 Proteinuria, unspecified: Secondary | ICD-10-CM | POA: Diagnosis not present

## 2013-04-17 DIAGNOSIS — N183 Chronic kidney disease, stage 3 unspecified: Secondary | ICD-10-CM | POA: Diagnosis not present

## 2013-04-17 DIAGNOSIS — I10 Essential (primary) hypertension: Secondary | ICD-10-CM | POA: Diagnosis not present

## 2013-04-18 ENCOUNTER — Emergency Department (INDEPENDENT_AMBULATORY_CARE_PROVIDER_SITE_OTHER)
Admission: EM | Admit: 2013-04-18 | Discharge: 2013-04-18 | Disposition: A | Discharge status: Home / Self Care | Payer: Medicare Other | Source: Home / Self Care

## 2013-04-18 ENCOUNTER — Encounter (HOSPITAL_COMMUNITY): Payer: Self-pay

## 2013-04-18 DIAGNOSIS — E1169 Type 2 diabetes mellitus with other specified complication: Secondary | ICD-10-CM | POA: Diagnosis not present

## 2013-04-18 DIAGNOSIS — S6981XA Other specified injuries of right wrist, hand and finger(s), initial encounter: Secondary | ICD-10-CM

## 2013-04-18 DIAGNOSIS — E119 Type 2 diabetes mellitus without complications: Secondary | ICD-10-CM

## 2013-04-18 DIAGNOSIS — E1159 Type 2 diabetes mellitus with other circulatory complications: Secondary | ICD-10-CM

## 2013-04-18 DIAGNOSIS — S6990XA Unspecified injury of unspecified wrist, hand and finger(s), initial encounter: Secondary | ICD-10-CM | POA: Diagnosis not present

## 2013-04-18 DIAGNOSIS — I10 Essential (primary) hypertension: Secondary | ICD-10-CM | POA: Diagnosis not present

## 2013-04-18 DIAGNOSIS — S6980XA Other specified injuries of unspecified wrist, hand and finger(s), initial encounter: Secondary | ICD-10-CM

## 2013-04-18 NOTE — ED Provider Notes (Signed)
Medical screening examination/treatment/procedure(s) were performed by resident physician or non-physician practitioner and as supervising physician I was immediately available for consultation/collaboration.   Pauline Good MD.   Billy Fischer, MD 04/18/13 2026

## 2013-04-18 NOTE — ED Notes (Addendum)
States she cannot see well, and accidentally cut her toe while trimming her thick  nails this AM. C/o some of her toes are numb, and has some sensation in others; was advised she should consider letting someone who can see well trim her nails for her; other nails on same foot are cut irregularly , deep, jagged

## 2013-04-18 NOTE — ED Provider Notes (Signed)
History     CSN: [id number]  Arrival date & time 04/18/13  1156   First MD Initiated Contact with Patient 04/18/13 1310      Chief Complaint  Patient presents with  . Foot Injury    (Consider location/radiation/quality/duration/timing/severity/associated sxs/prior treatment) HPI Comments: 57 year old female presents with a concern regarding the toenails in bed of the toenails of her right foot after trying to clear her toenails herself today. She apparently cut him to short and there are some sharp edges to the nails in the right fifth digit has mild bleeding where she may have cut the toenail too short. She is a deaf mute and an interpreter is present. She has a poor fund of knowledge with some difficulty in understanding her medical situation and inability to perform self glucose checking. She is not complaining of pain or other symptoms at this time.   Past Medical History  Diagnosis Date  . Hyperlipidemia   . Hypertension   . Diabetes mellitus   . Deaf     Lives with husband who is apparently also non-verbal. Has a daughter who can help with medicines and directions [name] [phone]) but is sometimes overwhelmed by the patient's issues.  . Bilateral leg edema   . Psychiatric disorder     She frequently exhibits paranoia and has been diagnosed with psychotic d/o NOS during hospital stay in the past. She is tangetial and perseverative during her visits. She apparently has had a bad experience with mental health in [place] in the past and refuses to discuss mental issues for fear that she will be sent back there. Her paranoia, communication issues, financial woes and lack of fam  . Fibroid uterus   . Anemia     Due to fibroids.  . Heart murmur     Past Surgical History  Procedure Laterality Date  . No past surgeries      History reviewed. No pertinent family history.  History  Substance Use Topics  . Smoking status: Never Smoker   . Smokeless tobacco: Not on file   . Alcohol Use: No    OB History   Grav Para Term Preterm Abortions TAB SAB Ect Mult Living                  Review of Systems  All other systems reviewed and are negative.    Allergies  Review of patient's allergies indicates no known allergies.  Home Medications   Current Outpatient Rx  Name  Route  Sig  Dispense  Refill  . ferrous sulfate 325 (65 FE) MG tablet   Oral   Take 325 mg by mouth daily with breakfast.         . lisinopril (PRINIVIL,ZESTRIL) 10 MG tablet   Oral   Take 10 mg by mouth daily.         . metFORMIN (GLUCOPHAGE) 500 MG tablet   Oral   Take 500 mg by mouth 2 (two) times daily with a meal.         . Multiple Vitamins-Minerals (MULTIVITAMIN WITH MINERALS) tablet   Oral   Take 1 tablet by mouth daily.         . simvastatin (ZOCOR) 40 MG tablet   Oral   Take 40 mg by mouth every evening.           BP 224/117  Pulse 90  Temp(Src) 97.7 F (36.5 C) (Oral)  Resp 20  SpO2 98%  Physical Exam  Constitutional: She  appears well-developed and well-nourished. No distress.  Eyes: EOM are normal.  Neck: Normal range of motion. Neck supple.  Pulmonary/Chest: Effort normal and breath sounds normal.  Musculoskeletal: She exhibits no edema and no tenderness.  Neurological: She is alert. She exhibits normal muscle tone.  Skin: Skin is warm and dry. No rash noted. No erythema.  Right foot the toenails have been cut too short and there is a portion of the nailbed exposed. Show signs of infection. The right fifth digit as scant bleeding from where she cut her closely. She has decreased sensation in her toes but motor is intact. No swelling, deformity of the digits or foot.  Psychiatric: She has a normal mood and affect.    ED Course  Procedures (including critical care time)  Labs Reviewed - No data to display No results found.   1. Nail bed injury, right, initial encounter   2. Hypertension associated with diabetes   3. Diabetes        MDM  Need to followup with your primary care provider/internal medicine Dr. next week. He will need to have her blood pressure rechecked since she just had an increase in medication dosage 1-2 days ago. You will also need a referral to the podiatrist. Do not try to cut her toenails yourself. Take her medicines as directed Apply Neosporin ointment to the end of your toes for the next 2-3 days. For any signs of infection such as increased redness, drainage, puffiness, pus or swelling will need to seek medical care promptly. All this explained the interpreter.        Janne Napoleon, NP 04/18/13 1349

## 2013-05-01 DIAGNOSIS — N183 Chronic kidney disease, stage 3 unspecified: Secondary | ICD-10-CM | POA: Diagnosis not present

## 2013-05-06 DIAGNOSIS — I129 Hypertensive chronic kidney disease with stage 1 through stage 4 chronic kidney disease, or unspecified chronic kidney disease: Secondary | ICD-10-CM | POA: Diagnosis not present

## 2013-05-06 DIAGNOSIS — N183 Chronic kidney disease, stage 3 unspecified: Secondary | ICD-10-CM | POA: Diagnosis not present

## 2013-05-06 DIAGNOSIS — R002 Palpitations: Secondary | ICD-10-CM | POA: Diagnosis not present

## 2013-05-06 DIAGNOSIS — I10 Essential (primary) hypertension: Secondary | ICD-10-CM | POA: Diagnosis not present

## 2013-05-20 ENCOUNTER — Ambulatory Visit (INDEPENDENT_AMBULATORY_CARE_PROVIDER_SITE_OTHER): Payer: Medicare Other | Admitting: Family Medicine

## 2013-05-20 ENCOUNTER — Encounter: Payer: Self-pay | Admitting: Family Medicine

## 2013-05-20 VITALS — BP 219/102 | HR 97 | Temp 98.1°F | Resp 18 | Ht 66.0 in | Wt 200.4 lb

## 2013-05-20 DIAGNOSIS — M79671 Pain in right foot: Secondary | ICD-10-CM

## 2013-05-20 DIAGNOSIS — M79609 Pain in unspecified limb: Secondary | ICD-10-CM | POA: Diagnosis not present

## 2013-05-20 MED ORDER — KETOROLAC TROMETHAMINE 30 MG/ML IJ SOLN
30.0000 mg | Freq: Once | INTRAMUSCULAR | Status: AC
  Administered 2013-05-20: 30 mg via INTRAMUSCULAR

## 2013-05-20 MED ORDER — LISINOPRIL 10 MG PO TABS: 10.0000 mg | ORAL_TABLET | Freq: Every day | ORAL | Status: DC

## 2013-05-20 MED ORDER — METFORMIN HCL 500 MG PO TABS: 500.0000 mg | ORAL_TABLET | Freq: Two times a day (BID) | ORAL | Status: DC

## 2013-05-20 NOTE — Progress Notes (Signed)
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 Patient is coming in with right foot pain. Patient states she does not rib or any true injury. Patient does have some numbness of her large first toe on the right side. Patient does not remember when this has started. Patient states from time to time she can get feeling. Patient states that she is ambulating normally. Patient states there is no sores, no discharge, no recent fevers or chills. Patient denies similar numbness on her contralateral foot. Patient has not had labs drawn or is taking any medications for quite some time. Patient denies any headache, chest pain, shortness of breath but states that her vision has changed dramatically since last visit. Patient is accompanied by her husband who is also deaf as well as her grandson who is helping with sign language. Past Medical History  Diagnosis Date  . Hyperlipidemia   . Hypertension   . Diabetes mellitus   . Deaf     Lives with husband who is apparently also non-verbal. Has a daughter who can help with medicines and directions [name] [phone]) but is sometimes overwhelmed by the patient's issues.  . Bilateral leg edema   . Psychiatric disorder     She frequently exhibits paranoia and has been diagnosed with psychotic d/o NOS during hospital stay in the past. She is tangetial and perseverative during her visits. She apparently has had a bad experience with mental health in [place] in the past and refuses to discuss mental issues for fear that she will be sent back there. Her paranoia, communication issues, financial woes and lack of fam  . Fibroid uterus   . Anemia     Due to fibroids.  . Heart murmur    Past Surgical History  Procedure Laterality Date  . No past surgeries     No family history on file. History   Social History  . Marital  Status: Married    Spouse Name: N/A    Number of Children: N/A  . Years of Education: N/A   Social History Main Topics  . Smoking status: Never Smoker   . Smokeless tobacco: Not on file  . Alcohol Use: No  . Drug Use: No  . Sexually Active: Not on file   Other Topics Concern  . Not on file   Social History Narrative  . No narrative on file   Physical exam Blood pressure 219/102, pulse 97, temperature 98.1 F (36.7 C), temperature source Oral, resp. rate 18, height 5\' 6"  (1.676 m), weight 200 lb 6.4 oz (90.901 kg), SpO2 100.00%. General: Patient does have significant difficulty hearing as well as seen. Patient is accompanied with her grandson who is attempting to translate for [place]. HEENT: Patient appears to have cataract bilaterally funduscopic exam very difficult to appreciate but does have cotton swab lesions. Cardiovascular: Regular rate and rhythm Patient does appear anxious. Right foot exam: On inspection there is no gross deformity. Patient does appear to be neurovascularly intact and withdraws from pain. There is no skin lesions. Patient does have significant 2+ effusion of the ankles bilaterally.  Assessment: 1. foot pain- hard to delineate what is exactly occurring. Patient has diabetic shoes and does not walk with an abnormal gait. Patient's on exam his fairly benign. Patient was given a shot of portal 30 mg today. I would likely think this  pain is secondary to her uncontrolled hypertension and lower extremity swelling bilaterally. In addition this probably her uncontrolled diabetes is also not helping. 2. htn- uncontrolled at this time. Patient's poor vision to be secondary to this as well. Discuss getting less today which patient declined. Discuss the need of having a primary care provider. Discussed red flags.  3.  DM- Declined labs, likely poor control and numbness of toe could be cause by this. The discussed establishing care here. Patient was given names but suggested that  she shouldn't return to her primary care provider.

## 2013-05-20 NOTE — Patient Instructions (Signed)
Very nice to meet you I think your foot pain is due to your swelling and your blood pressure and diabetes.  I have refilled your blood pressure medicine and your metformin.  I think this will be the most helpful. Your blood pressure is very high and dangerous.  If you get any chestpain, and shortness of breath or weakness in extremities please go to emergency room immediately.   If you want you can establish care Dr. Brigitte Pulse and Dr. Sheffield Slider are taking new patients.

## 2013-06-04 ENCOUNTER — Encounter (HOSPITAL_COMMUNITY): Payer: Self-pay | Admitting: Physical Medicine and Rehabilitation

## 2013-06-04 ENCOUNTER — Emergency Department (HOSPITAL_COMMUNITY)
Admission: EM | Admit: 2013-06-04 | Discharge: 2013-06-04 | Discharge status: Against Medical Advice | Payer: Medicare Other | Attending: Emergency Medicine | Admitting: Emergency Medicine

## 2013-06-04 DIAGNOSIS — Z8659 Personal history of other mental and behavioral disorders: Secondary | ICD-10-CM | POA: Insufficient documentation

## 2013-06-04 DIAGNOSIS — Z8742 Personal history of other diseases of the female genital tract: Secondary | ICD-10-CM | POA: Insufficient documentation

## 2013-06-04 DIAGNOSIS — H538 Other visual disturbances: Secondary | ICD-10-CM | POA: Diagnosis not present

## 2013-06-04 DIAGNOSIS — D649 Anemia, unspecified: Secondary | ICD-10-CM | POA: Diagnosis not present

## 2013-06-04 DIAGNOSIS — Z79899 Other long term (current) drug therapy: Secondary | ICD-10-CM | POA: Insufficient documentation

## 2013-06-04 DIAGNOSIS — I10 Essential (primary) hypertension: Secondary | ICD-10-CM | POA: Insufficient documentation

## 2013-06-04 DIAGNOSIS — R011 Cardiac murmur, unspecified: Secondary | ICD-10-CM | POA: Insufficient documentation

## 2013-06-04 DIAGNOSIS — Z8739 Personal history of other diseases of the musculoskeletal system and connective tissue: Secondary | ICD-10-CM | POA: Insufficient documentation

## 2013-06-04 DIAGNOSIS — H913 Deaf nonspeaking, not elsewhere classified: Secondary | ICD-10-CM | POA: Diagnosis not present

## 2013-06-04 DIAGNOSIS — E785 Hyperlipidemia, unspecified: Secondary | ICD-10-CM | POA: Diagnosis not present

## 2013-06-04 DIAGNOSIS — E119 Type 2 diabetes mellitus without complications: Secondary | ICD-10-CM | POA: Diagnosis not present

## 2013-06-04 MED ORDER — LABETALOL HCL 100 MG PO TABS
100.0000 mg | ORAL_TABLET | ORAL | Status: DC
  Filled 2013-06-04: qty 1

## 2013-06-04 NOTE — ED Provider Notes (Signed)
History    This chart was scribed for non-physician practitioner, Abigail Butts, PA-C, working with Carmin Muskrat, MD by Adriana Reams, ED Scribe. This patient was seen in room TR10C/TR10C and the patient's care was started at 1838.   CSN: [id number]  Arrival date & time 06/04/13  1533   First MD Initiated Contact with Patient 06/04/13 1838      Chief Complaint  Patient presents with  . Eye Problem     The history is provided by the patient and medical records. A language interpreter was used.   HPI Comments: [name] is a 57 y.o. 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 She was supposed to have cataract surgery but they have not done it because her BP has been elevated and her DM has been an issue. She was being treated at [facility]. She has an appointment with Dr. Rodman Key next week. She recently was changed to a new BP medication. She threw out the BP medication along with all the rest of her medications because she was experiencing pressure behind her eyes. She is asymptomatic today, denying chest pain, shortness of breath, headache.  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 Dr. Hulan Saas saw her on 05/20/13 and states she had not seen her regular doctor for more than 2 years. He reports she had high BP and was asymptomatic then. She was seen in the ED on 04/06/13 for her HTN and her BP was 230/110. On that visit she denied a hypertensive urgency workup.  She also recently had a procedure done on her foot where something was removed from her toe; she is unable to clarify this for [place].   Past Medical History  Diagnosis Date  .  Hyperlipidemia   . Hypertension   . Diabetes mellitus   . Deaf     Lives with husband who is apparently also non-verbal. Has a daughter who can help with medicines and directions [name] [phone]) but is sometimes overwhelmed by the patient's issues.  . Bilateral leg edema   . Psychiatric disorder     She frequently exhibits paranoia and has been diagnosed with psychotic d/o NOS during hospital stay in the past. She is tangetial and perseverative during her visits. She apparently has had a bad experience with mental health in [place] in the past and refuses to discuss mental issues for fear that she will be sent back there. Her paranoia, communication issues, financial woes and lack of fam  . Fibroid uterus   . Anemia     Due to fibroids.  . Heart murmur     Past Surgical History  Procedure Laterality Date  . No past surgeries      No family history on file.  History  Substance Use Topics  . Smoking status: Never Smoker   . Smokeless tobacco: Not on file  . Alcohol Use: No     Review of Systems  Constitutional: Negative for fever, diaphoresis, appetite change, fatigue and unexpected weight change.  HENT: Negative for mouth sores and neck stiffness.   Eyes: Positive for visual disturbance.  Respiratory: Negative for cough, chest tightness, shortness of breath and wheezing.   Cardiovascular: Negative for chest pain.  Gastrointestinal: Negative for nausea, vomiting, abdominal pain, diarrhea and constipation.  Endocrine: Negative for polydipsia, polyphagia and polyuria.  Genitourinary: Negative for dysuria, urgency, frequency and hematuria.  Musculoskeletal: Negative for back pain.  Skin: Negative for rash.  Allergic/Immunologic: Negative for immunocompromised state.  Neurological: Negative for syncope, light-headedness and headaches.  Hematological: Does not bruise/bleed easily.  Psychiatric/Behavioral: Negative for sleep disturbance. The patient is not nervous/anxious.      Allergies  Review of patient's allergies indicates no known allergies.  Home Medications   Current Outpatient Rx  Name  Route  Sig  Dispense  Refill  . ferrous sulfate 325 (65 FE) MG tablet   Oral   Take 325 mg by mouth daily with breakfast.         . lisinopril (PRINIVIL,ZESTRIL) 10 MG tablet   Oral   Take 1 tablet (10 mg total) by mouth daily.   30 tablet   0   . metFORMIN (GLUCOPHAGE) 500 MG tablet   Oral   Take 1 tablet (500 mg total) by mouth 2 (two) times daily with a meal.   60 tablet   0   . Multiple Vitamins-Minerals (MULTIVITAMIN WITH MINERALS) tablet   Oral   Take 1 tablet by mouth daily.         . simvastatin (ZOCOR) 40 MG tablet   Oral   Take 40 mg by mouth every evening.           BP 241/109  Pulse 109  Temp(Src) 98.1 F (36.7 C) (Oral)  SpO2 100%  Physical Exam  Nursing note and vitals reviewed. Constitutional: She is oriented to person, place, and time. She appears well-developed and well-nourished. No distress.  HENT:  Head: Normocephalic and atraumatic.  Mouth/Throat: Oropharynx is clear and moist. No oropharyngeal exudate.  Eyes: Conjunctivae and EOM are normal. Pupils are equal, round, and reactive to light. Right conjunctiva is not injected. Right conjunctiva has no hemorrhage. Left conjunctiva is not injected. Left conjunctiva has no hemorrhage. No scleral icterus.  Fundoscopic exam:      The right eye shows arteriolar narrowing, AV nicking and exudate. The right eye shows no papilledema.       The left eye shows arteriolar narrowing, AV nicking and exudate. The left eye shows no papilledema.  AV nicking. Cotton wool spots. Hard exudates. Subretinal hemorrhages throughout. Mild cataracts on exam.  Neck: Normal range of motion. Neck supple.  Cardiovascular: Regular rhythm, S1 normal, S2 normal, normal heart sounds and intact distal pulses.  Tachycardia present.   Pulses:      Radial pulses are 2+ on the right side, and 2+ on the  left side.       Dorsalis pedis pulses are 2+ on the right side, and 2+ on the left side.       Posterior tibial pulses are 2+ on the right side, and 2+ on the left side.  Pulmonary/Chest: Effort normal and breath sounds normal. No respiratory distress. She has no wheezes. She has no rales.  Abdominal: Soft. Bowel sounds are normal. She exhibits no mass. There is no tenderness. There is no rebound and no guarding.  Musculoskeletal: Normal range of motion. She exhibits no edema.  Lymphadenopathy:    She has no cervical adenopathy.  Neurological: She is alert and oriented to person, place, and time. No cranial nerve deficit. She exhibits normal muscle tone. Coordination normal.  Major Cranial nerves without deficit, no facial droop Normal strength in upper and lower extremities bilaterally including dorsiflexion  and plantar flexion, strong and equal grip strength Sensation normal to light and sharp touch Moves extremities without ataxia, coordination intact Normal finger to nose  Normal gait and balance  Skin: Skin is warm and dry. She is not diaphoretic. No erythema.  Psychiatric: She has a normal mood and affect.    ED Course  Procedures (including critical care time) DIAGNOSTIC STUDIES: Oxygen Saturation is 100% on RA, normal by my interpretation.    COORDINATION OF CARE: 7:03 PM Discussed treatment plan which includes consultation with Dr. Vanita Panda with pt at bedside and pt agreed to plan. Will give PCP resource guide. Discussed importance of taking BP medication.  7:25 PM Case discussed with Dr. Vanita Panda, who advised a cardiac workup.  7:26 PM Above discussed with pt. Pt is unsure if she wants to stay for workup. She is requesting to have her BP rechecked before she makes a decision.      Labs Reviewed - No data to display No results found.   1. Hypertension       MDM  Milton presents with a [facility] medical Hx and significantly high BP at 249/109.  No evidence of  CVA. Neurologically intact. Her vision changes seem to be chronic and worsening gradually over time as opposed to acute. It is unclear for much of the visit when the patient is here she has tangential thinking, consistently discussing her unhappiness with her primary care provider. She does have an appointment with ophthalmology next week to evaluate her cataracts.  Discussed with patient at length the danger of her blood pressure remaining high including CVA and MI. She states that her medication does not help therefore she does not want to take it. I explained that without taking her medication her blood pressure will only continue to go up.  I also recommended a hypertensive urgency workup.  She declines any testing along the medications.  Patient blood sugar rechecked manually with a reading of 250/120.  Patient left AMA after being seen by Dr. Vanita Panda, who also recommended treatment.  Dr. Vanita Panda was consulted, evaluated this patient with me and agrees with the plan.     Jarrett Soho Annalaura Sauseda, PA-C 06/05/13 0005

## 2013-06-04 NOTE — ED Notes (Signed)
Pt presents to department for evaluation of eye problems. States she was seen at [facility] and she doesn't like physician. Recently changed BP medications and now states pressure behind both eyes. Also states blurred vision and dimming of light to both eyes. Denies pain at the time. Pt is conscious alert and oriented x4. She is deaf and blind. Interpreter at bedside.

## 2013-06-04 NOTE — ED Notes (Signed)
Dr. Vanita Panda aware of [name] BP and at bedside.  [name] states she wants to leave AMA- discussing decision with Dr.Lockwood.

## 2013-06-06 NOTE — ED Provider Notes (Signed)
  Medical screening examination/treatment/procedure(s) were performed by non-physician practitioner and as supervising physician I was immediately available for consultation/collaboration.  On my exam the patient was in no distress. Via a translator I conveyed the importance of medication compliance, and following up with her ophthalmologist.  The patient seemed agreeable to this.  However, the patient eventually left AGAINST MEDICAL ADVICE prior to completing her evaluation here    Carmin Muskrat, MD 06/06/13 1626

## 2013-06-13 DIAGNOSIS — I129 Hypertensive chronic kidney disease with stage 1 through stage 4 chronic kidney disease, or unspecified chronic kidney disease: Secondary | ICD-10-CM | POA: Diagnosis not present

## 2013-06-16 ENCOUNTER — Emergency Department (HOSPITAL_COMMUNITY)
Admission: EM | Admit: 2013-06-16 | Discharge: 2013-06-16 | Disposition: A | Discharge status: Home / Self Care | Payer: Medicare Other | Attending: Emergency Medicine | Admitting: Emergency Medicine

## 2013-06-16 DIAGNOSIS — Z79899 Other long term (current) drug therapy: Secondary | ICD-10-CM | POA: Diagnosis not present

## 2013-06-16 DIAGNOSIS — H913 Deaf nonspeaking, not elsewhere classified: Secondary | ICD-10-CM | POA: Diagnosis not present

## 2013-06-16 DIAGNOSIS — Z862 Personal history of diseases of the blood and blood-forming organs and certain disorders involving the immune mechanism: Secondary | ICD-10-CM | POA: Diagnosis not present

## 2013-06-16 DIAGNOSIS — D649 Anemia, unspecified: Secondary | ICD-10-CM | POA: Diagnosis not present

## 2013-06-16 DIAGNOSIS — E119 Type 2 diabetes mellitus without complications: Secondary | ICD-10-CM | POA: Diagnosis not present

## 2013-06-16 DIAGNOSIS — R011 Cardiac murmur, unspecified: Secondary | ICD-10-CM | POA: Diagnosis not present

## 2013-06-16 DIAGNOSIS — I10 Essential (primary) hypertension: Secondary | ICD-10-CM | POA: Insufficient documentation

## 2013-06-16 DIAGNOSIS — H538 Other visual disturbances: Secondary | ICD-10-CM | POA: Diagnosis not present

## 2013-06-16 DIAGNOSIS — Z8659 Personal history of other mental and behavioral disorders: Secondary | ICD-10-CM | POA: Diagnosis not present

## 2013-06-16 DIAGNOSIS — Z8742 Personal history of other diseases of the female genital tract: Secondary | ICD-10-CM | POA: Insufficient documentation

## 2013-06-16 DIAGNOSIS — Z8639 Personal history of other endocrine, nutritional and metabolic disease: Secondary | ICD-10-CM | POA: Insufficient documentation

## 2013-06-16 NOTE — ED Notes (Signed)
Reported high blood pressure to PA. PA stated that pt is noncompliant with medication.

## 2013-06-16 NOTE — ED Notes (Addendum)
Waiting for sign language interpretor to arrive. Pt is stable and PA has been at bedside.  Will complete triage.

## 2013-06-16 NOTE — ED Notes (Signed)
[id number] Expected date:<BR> Expected time:<BR> Means of arrival:Ambulance<BR> Comments:<BR> 83yoF,abd pain,n/v

## 2013-06-16 NOTE — ED Provider Notes (Signed)
History     CSN: [id number]  Arrival date & time 06/16/13  1159   First MD Initiated Contact with Patient 06/16/13 1209      Chief Complaint  Patient presents with  . 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 She was seen on 6/13 for the same and left AMA. She is a very difficult interview and is visibly angry. She informs me and the sign language interpretor that [employer] gave her hypertension and diabetes and that she plans to sue them for this reason. She was supposed to have eye surgery done in [place] but they refused because her blood pressure is out of control. The patient tells me that she refuses to take any medication for her blood pressure and diabetes because she does not need it. She is angry and says that everyone who tries to give her medications makes her very angry. She has no pain or itching in her eyes but blurry vision. Per interpreter she does not answer questions as expected and fixates on her eyes.    Past Medical History  Diagnosis Date  . Hyperlipidemia   . Hypertension   . Diabetes mellitus   . Deaf     Lives with husband who is apparently also non-verbal. Has a daughter who can help with medicines and directions [name] [phone]) but is sometimes overwhelmed by the patient's issues.  . Bilateral leg edema   . Psychiatric disorder     She frequently exhibits paranoia and has been diagnosed with psychotic d/o NOS during hospital stay in the past. She is tangetial and perseverative during her visits. She apparently has had a bad experience with mental health in [place] in the past and refuses to discuss mental issues for fear that she will be sent back  there. Her paranoia, communication issues, financial woes and lack of fam  . Fibroid uterus   . Anemia     Due to fibroids.  . Heart murmur     Past Surgical History  Procedure Laterality Date  . No past surgeries      No family history on file.  History  Substance Use Topics  . Smoking status: Never Smoker   . Smokeless tobacco: Not on file  . Alcohol Use: No    OB History   Grav Para Term Preterm Abortions TAB SAB Ect Mult Living                  Review of Systems Level 5 caveat - pt uncooperative with questions. Allergies  Review of patient's allergies indicates no known allergies.  Home Medications   Current Outpatient Rx  Name  Route  Sig  Dispense  Refill  . ferrous sulfate 325 (65 FE) MG tablet   Oral   Take 325 mg by mouth daily with breakfast.         . lisinopril (PRINIVIL,ZESTRIL) 10 MG tablet   Oral   Take 1 tablet (10 mg total) by mouth daily.   30 tablet   0   . metFORMIN (GLUCOPHAGE) 500 MG tablet   Oral   Take 1 tablet (500 mg total) by mouth 2 (two) times daily with a meal.   60 tablet   0   .  Multiple Vitamins-Minerals (MULTIVITAMIN WITH MINERALS) tablet   Oral   Take 1 tablet by mouth daily.         . simvastatin (ZOCOR) 40 MG tablet   Oral   Take 40 mg by mouth every evening.           BP 243/105  Pulse 93  Temp(Src) 98.1 F (36.7 C) (Oral)  Resp 18  SpO2 100%  Physical Exam  Nursing note and vitals reviewed. Constitutional: She appears well-developed and well-nourished. No distress.  HENT:  Head: Normocephalic and atraumatic.  Eyes: Conjunctivae, EOM and lids are normal. Pupils are equal, round, and reactive to light.  Neck: Normal range of motion. Neck supple.  Cardiovascular: Normal rate and regular rhythm.   Pulmonary/Chest: Effort normal.  Abdominal: Soft.  Neurological: She is alert.  Skin: Skin is warm and dry.    ED Course  Procedures (including critical care time)  Labs Reviewed - No data to  display No results found.   1. Blurry vision, bilateral       MDM  Pt refused blood draw, refused to take medication for diabetes or blood pressure. In denial that her decreased vision is due to her diseases. She believes that [facility] gave her HTN and DM. She wants a new eye doctor that is in [place] and specifically requests Dr. Zigmund Daniel on [facility].  57 y.o.[name]'s evaluation in the Emergency Department is complete. It has been determined that no acute conditions requiring further emergency intervention are present at this time. The patient/guardian have been advised of the diagnosis and plan. We have discussed signs and symptoms that warrant return to the ED, such as changes or worsening in symptoms.  Vital signs are stable at discharge. Filed Vitals:   06/16/13 1218  BP: 243/105  Pulse: 93  Temp: 98.1 F (36.7 C)  Resp: 18    Patient/guardian has voiced understanding and agreed to follow-up with the PCP or specialist.         [name] Mako, PA-C 06/16/13 1436

## 2013-06-16 NOTE — ED Notes (Signed)
Pt refusing to answer questions for pharmacy technician regarding medications.

## 2013-06-16 NOTE — ED Provider Notes (Signed)
Medical screening examination/treatment/procedure(s) were performed by non-physician practitioner and as supervising physician I was immediately available for consultation/collaboration.   Blanchie Dessert, MD 06/16/13 [phone]

## 2013-06-16 NOTE — ED Notes (Signed)
Interpretor at bedside.  PA alerted.

## 2013-07-31 DIAGNOSIS — E119 Type 2 diabetes mellitus without complications: Secondary | ICD-10-CM | POA: Diagnosis not present

## 2013-08-15 ENCOUNTER — Encounter (INDEPENDENT_AMBULATORY_CARE_PROVIDER_SITE_OTHER): Payer: Medicare Other | Admitting: Ophthalmology

## 2013-08-21 ENCOUNTER — Emergency Department (INDEPENDENT_AMBULATORY_CARE_PROVIDER_SITE_OTHER)
Admission: EM | Admit: 2013-08-21 | Discharge: 2013-08-21 | Disposition: A | Discharge status: Home / Self Care | Payer: Medicare Other | Source: Home / Self Care | Attending: Family Medicine | Admitting: Family Medicine

## 2013-08-21 ENCOUNTER — Encounter (HOSPITAL_COMMUNITY): Payer: Self-pay | Admitting: Emergency Medicine

## 2013-08-21 DIAGNOSIS — IMO0002 Reserved for concepts with insufficient information to code with codable children: Secondary | ICD-10-CM

## 2013-08-21 DIAGNOSIS — S90414A Abrasion, right lesser toe(s), initial encounter: Secondary | ICD-10-CM

## 2013-08-21 NOTE — ED Notes (Signed)
Difficulty in getting assessent from pt due to she is deaf and mute and unable to write well ... Presented here by herself Pt c/o right toe pain onset 1 week that's causing her some pain

## 2013-08-21 NOTE — ED Provider Notes (Signed)
Medical screening examination/treatment/procedure(s) were performed by resident physician or non-physician practitioner and as supervising physician I was immediately available for consultation/collaboration.   Pauline Good MD.   Billy Fischer, MD 08/21/13 [phone]

## 2013-08-21 NOTE — ED Notes (Signed)
Pt declined pos op shoe... Edwyna Ready was notified.

## 2013-08-21 NOTE — ED Provider Notes (Signed)
CSN: [id number]     Arrival date & time 08/21/13  1529 History   First MD Initiated Contact with Patient 08/21/13 1634     Chief Complaint  Patient presents with  . Toe Pain   (Consider location/radiation/quality/duration/timing/severity/associated sxs/prior Treatment) HPI Comments: History and review of systems are very difficult because patient is deaf, mute, and mostly blind. Communication via written text.   57 year old female presents for evaluation of toe pain on the right side. She was having some thick skin there which she peeled off but now she is bleeding. She brought the skin she peeled off with her plastic bag  Patient is a 57 y.o. female presenting with toe pain.  Toe Pain    Past Medical History  Diagnosis Date  . Hyperlipidemia   . Hypertension   . Diabetes mellitus   . Deaf     Lives with husband who is apparently also non-verbal. Has a daughter who can help with medicines and directions [name] [phone]) but is sometimes overwhelmed by the patient's issues.  . Bilateral leg edema   . Psychiatric disorder     She frequently exhibits paranoia and has been diagnosed with psychotic d/o NOS during hospital stay in the past. She is tangetial and perseverative during her visits. She apparently has had a bad experience with mental health in [place] in the past and refuses to discuss mental issues for fear that she will be sent back there. Her paranoia, communication issues, financial woes and lack of fam  . Fibroid uterus   . Anemia     Due to fibroids.  . Heart murmur    Past Surgical History  Procedure Laterality Date  . No past surgeries     No family history on file. History  Substance Use Topics  . Smoking status: Never Smoker   . Smokeless tobacco: Not on file  . Alcohol Use: No   OB History   Grav Para Term Preterm Abortions TAB SAB Ect Mult Living                 Review of Systems  Unable to perform ROS: Patient nonverbal    Allergies  Review  of patient's allergies indicates no known allergies.  Home Medications   Current Outpatient Rx  Name  Route  Sig  Dispense  Refill  . ferrous sulfate 325 (65 FE) MG tablet   Oral   Take 325 mg by mouth daily with breakfast.         . lisinopril (PRINIVIL,ZESTRIL) 10 MG tablet   Oral   Take 1 tablet (10 mg total) by mouth daily.   30 tablet   0   . metFORMIN (GLUCOPHAGE) 500 MG tablet   Oral   Take 1 tablet (500 mg total) by mouth 2 (two) times daily with a meal.   60 tablet   0   . Multiple Vitamins-Minerals (MULTIVITAMIN WITH MINERALS) tablet   Oral   Take 1 tablet by mouth daily.         . simvastatin (ZOCOR) 40 MG tablet   Oral   Take 40 mg by mouth every evening.          BP 221/109  Pulse 96  Temp(Src) 98.5 F (36.9 C) (Oral)  Resp 22  SpO2 97% Physical Exam  Constitutional: She appears well-developed and well-nourished. No distress.  Neurological: She is alert.  Skin: She is not diaphoretic.  Base of right toe, plantar surface: skin pulled off, bleeding controlled prior  to exam     ED Course  Procedures (including critical care time) Labs Review Labs Reviewed - No data to display Imaging Review No results found.  Wound cleaned, dressed   MDM   1. Abrasion of toe, right, initial encounter    Advised to keep wound clean and dry.  F/u if increasing pain.      Liam Graham, PA-C 08/21/13 1743

## 2013-09-22 DIAGNOSIS — E11359 Type 2 diabetes mellitus with proliferative diabetic retinopathy without macular edema: Secondary | ICD-10-CM | POA: Diagnosis not present

## 2013-09-22 DIAGNOSIS — E1139 Type 2 diabetes mellitus with other diabetic ophthalmic complication: Secondary | ICD-10-CM | POA: Diagnosis not present

## 2013-09-22 DIAGNOSIS — H2510 Age-related nuclear cataract, unspecified eye: Secondary | ICD-10-CM | POA: Diagnosis not present

## 2013-09-23 DIAGNOSIS — E1139 Type 2 diabetes mellitus with other diabetic ophthalmic complication: Secondary | ICD-10-CM | POA: Diagnosis not present

## 2013-09-23 DIAGNOSIS — I10 Essential (primary) hypertension: Secondary | ICD-10-CM | POA: Diagnosis not present

## 2013-09-23 DIAGNOSIS — E119 Type 2 diabetes mellitus without complications: Secondary | ICD-10-CM | POA: Diagnosis not present

## 2013-09-23 DIAGNOSIS — H2510 Age-related nuclear cataract, unspecified eye: Secondary | ICD-10-CM | POA: Diagnosis not present

## 2013-09-23 DIAGNOSIS — E11311 Type 2 diabetes mellitus with unspecified diabetic retinopathy with macular edema: Secondary | ICD-10-CM | POA: Diagnosis not present

## 2013-09-23 DIAGNOSIS — H3582 Retinal ischemia: Secondary | ICD-10-CM | POA: Diagnosis not present

## 2013-09-23 DIAGNOSIS — H3340 Traction detachment of retina, unspecified eye: Secondary | ICD-10-CM | POA: Diagnosis not present

## 2013-09-23 DIAGNOSIS — E11359 Type 2 diabetes mellitus with proliferative diabetic retinopathy without macular edema: Secondary | ICD-10-CM | POA: Diagnosis not present

## 2013-10-07 DIAGNOSIS — H2510 Age-related nuclear cataract, unspecified eye: Secondary | ICD-10-CM | POA: Diagnosis not present

## 2013-10-07 DIAGNOSIS — E1139 Type 2 diabetes mellitus with other diabetic ophthalmic complication: Secondary | ICD-10-CM | POA: Diagnosis not present

## 2013-10-07 DIAGNOSIS — H3582 Retinal ischemia: Secondary | ICD-10-CM | POA: Diagnosis not present

## 2013-10-07 DIAGNOSIS — E11311 Type 2 diabetes mellitus with unspecified diabetic retinopathy with macular edema: Secondary | ICD-10-CM | POA: Diagnosis not present

## 2013-10-07 DIAGNOSIS — I10 Essential (primary) hypertension: Secondary | ICD-10-CM | POA: Diagnosis not present

## 2013-10-07 DIAGNOSIS — H3340 Traction detachment of retina, unspecified eye: Secondary | ICD-10-CM | POA: Diagnosis not present

## 2013-10-07 DIAGNOSIS — E119 Type 2 diabetes mellitus without complications: Secondary | ICD-10-CM | POA: Diagnosis not present

## 2013-10-07 DIAGNOSIS — E11359 Type 2 diabetes mellitus with proliferative diabetic retinopathy without macular edema: Secondary | ICD-10-CM | POA: Diagnosis not present

## 2013-11-25 DIAGNOSIS — H3500 Unspecified background retinopathy: Secondary | ICD-10-CM | POA: Diagnosis not present

## 2013-11-25 DIAGNOSIS — E119 Type 2 diabetes mellitus without complications: Secondary | ICD-10-CM | POA: Diagnosis not present

## 2013-11-25 DIAGNOSIS — I10 Essential (primary) hypertension: Secondary | ICD-10-CM | POA: Diagnosis not present

## 2013-11-25 DIAGNOSIS — F209 Schizophrenia, unspecified: Secondary | ICD-10-CM | POA: Diagnosis not present

## 2013-11-25 DIAGNOSIS — F22 Delusional disorders: Secondary | ICD-10-CM | POA: Diagnosis not present

## 2013-12-22 ENCOUNTER — Inpatient Hospital Stay (HOSPITAL_COMMUNITY)
Admission: AD | Admit: 2013-12-22 | Discharge: 2014-01-08 | DRG: 981 | Disposition: A | Discharge status: Skilled Nursing Facility | Payer: Medicare Other | Source: Ambulatory Visit | Attending: Internal Medicine | Admitting: Internal Medicine

## 2013-12-22 ENCOUNTER — Inpatient Hospital Stay (HOSPITAL_COMMUNITY): Payer: Medicare Other

## 2013-12-22 ENCOUNTER — Encounter (HOSPITAL_COMMUNITY): Payer: Self-pay | Admitting: *Deleted

## 2013-12-22 DIAGNOSIS — E11319 Type 2 diabetes mellitus with unspecified diabetic retinopathy without macular edema: Secondary | ICD-10-CM | POA: Diagnosis present

## 2013-12-22 DIAGNOSIS — I4729 Other ventricular tachycardia: Secondary | ICD-10-CM | POA: Diagnosis not present

## 2013-12-22 DIAGNOSIS — N179 Acute kidney failure, unspecified: Secondary | ICD-10-CM | POA: Diagnosis not present

## 2013-12-22 DIAGNOSIS — I498 Other specified cardiac arrhythmias: Secondary | ICD-10-CM | POA: Diagnosis present

## 2013-12-22 DIAGNOSIS — F3289 Other specified depressive episodes: Secondary | ICD-10-CM | POA: Diagnosis present

## 2013-12-22 DIAGNOSIS — I10 Essential (primary) hypertension: Secondary | ICD-10-CM

## 2013-12-22 DIAGNOSIS — F32A Depression, unspecified: Secondary | ICD-10-CM

## 2013-12-22 DIAGNOSIS — J9600 Acute respiratory failure, unspecified whether with hypoxia or hypercapnia: Secondary | ICD-10-CM | POA: Diagnosis not present

## 2013-12-22 DIAGNOSIS — I421 Obstructive hypertrophic cardiomyopathy: Secondary | ICD-10-CM | POA: Diagnosis present

## 2013-12-22 DIAGNOSIS — I471 Supraventricular tachycardia, unspecified: Secondary | ICD-10-CM

## 2013-12-22 DIAGNOSIS — F489 Nonpsychotic mental disorder, unspecified: Secondary | ICD-10-CM | POA: Diagnosis present

## 2013-12-22 DIAGNOSIS — H543 Unqualified visual loss, both eyes: Secondary | ICD-10-CM | POA: Diagnosis present

## 2013-12-22 DIAGNOSIS — N2581 Secondary hyperparathyroidism of renal origin: Secondary | ICD-10-CM | POA: Diagnosis present

## 2013-12-22 DIAGNOSIS — Z794 Long term (current) use of insulin: Secondary | ICD-10-CM | POA: Diagnosis not present

## 2013-12-22 DIAGNOSIS — I5032 Chronic diastolic (congestive) heart failure: Secondary | ICD-10-CM

## 2013-12-22 DIAGNOSIS — N186 End stage renal disease: Secondary | ICD-10-CM | POA: Diagnosis not present

## 2013-12-22 DIAGNOSIS — I119 Hypertensive heart disease without heart failure: Secondary | ICD-10-CM | POA: Diagnosis not present

## 2013-12-22 DIAGNOSIS — M6281 Muscle weakness (generalized): Secondary | ICD-10-CM | POA: Diagnosis not present

## 2013-12-22 DIAGNOSIS — I2789 Other specified pulmonary heart diseases: Secondary | ICD-10-CM | POA: Diagnosis present

## 2013-12-22 DIAGNOSIS — R609 Edema, unspecified: Secondary | ICD-10-CM | POA: Diagnosis not present

## 2013-12-22 DIAGNOSIS — D649 Anemia, unspecified: Secondary | ICD-10-CM | POA: Diagnosis not present

## 2013-12-22 DIAGNOSIS — F39 Unspecified mood [affective] disorder: Secondary | ICD-10-CM | POA: Diagnosis not present

## 2013-12-22 DIAGNOSIS — I499 Cardiac arrhythmia, unspecified: Secondary | ICD-10-CM | POA: Diagnosis not present

## 2013-12-22 DIAGNOSIS — E785 Hyperlipidemia, unspecified: Secondary | ICD-10-CM | POA: Diagnosis present

## 2013-12-22 DIAGNOSIS — E1122 Type 2 diabetes mellitus with diabetic chronic kidney disease: Secondary | ICD-10-CM

## 2013-12-22 DIAGNOSIS — I872 Venous insufficiency (chronic) (peripheral): Secondary | ICD-10-CM | POA: Diagnosis present

## 2013-12-22 DIAGNOSIS — R488 Other symbolic dysfunctions: Secondary | ICD-10-CM | POA: Diagnosis not present

## 2013-12-22 DIAGNOSIS — I472 Ventricular tachycardia, unspecified: Secondary | ICD-10-CM | POA: Diagnosis not present

## 2013-12-22 DIAGNOSIS — E119 Type 2 diabetes mellitus without complications: Secondary | ICD-10-CM | POA: Diagnosis not present

## 2013-12-22 DIAGNOSIS — I509 Heart failure, unspecified: Secondary | ICD-10-CM | POA: Diagnosis present

## 2013-12-22 DIAGNOSIS — N189 Chronic kidney disease, unspecified: Secondary | ICD-10-CM | POA: Diagnosis not present

## 2013-12-22 DIAGNOSIS — N049 Nephrotic syndrome with unspecified morphologic changes: Secondary | ICD-10-CM | POA: Diagnosis present

## 2013-12-22 DIAGNOSIS — N19 Unspecified kidney failure: Secondary | ICD-10-CM | POA: Diagnosis not present

## 2013-12-22 DIAGNOSIS — J189 Pneumonia, unspecified organism: Secondary | ICD-10-CM | POA: Diagnosis not present

## 2013-12-22 DIAGNOSIS — J9601 Acute respiratory failure with hypoxia: Secondary | ICD-10-CM

## 2013-12-22 DIAGNOSIS — N058 Unspecified nephritic syndrome with other morphologic changes: Secondary | ICD-10-CM | POA: Diagnosis present

## 2013-12-22 DIAGNOSIS — Z9119 Patient's noncompliance with other medical treatment and regimen: Secondary | ICD-10-CM | POA: Diagnosis not present

## 2013-12-22 DIAGNOSIS — H913 Deaf nonspeaking, not elsewhere classified: Secondary | ICD-10-CM | POA: Diagnosis not present

## 2013-12-22 DIAGNOSIS — I120 Hypertensive chronic kidney disease with stage 5 chronic kidney disease or end stage renal disease: Secondary | ICD-10-CM | POA: Diagnosis present

## 2013-12-22 DIAGNOSIS — Z8659 Personal history of other mental and behavioral disorders: Secondary | ICD-10-CM

## 2013-12-22 DIAGNOSIS — R918 Other nonspecific abnormal finding of lung field: Secondary | ICD-10-CM | POA: Diagnosis not present

## 2013-12-22 DIAGNOSIS — R079 Chest pain, unspecified: Secondary | ICD-10-CM | POA: Diagnosis not present

## 2013-12-22 DIAGNOSIS — D259 Leiomyoma of uterus, unspecified: Secondary | ICD-10-CM | POA: Diagnosis present

## 2013-12-22 DIAGNOSIS — Z7982 Long term (current) use of aspirin: Secondary | ICD-10-CM

## 2013-12-22 DIAGNOSIS — Z79899 Other long term (current) drug therapy: Secondary | ICD-10-CM

## 2013-12-22 DIAGNOSIS — F329 Major depressive disorder, single episode, unspecified: Secondary | ICD-10-CM | POA: Diagnosis present

## 2013-12-22 DIAGNOSIS — R6889 Other general symptoms and signs: Secondary | ICD-10-CM | POA: Diagnosis not present

## 2013-12-22 DIAGNOSIS — Z6835 Body mass index (BMI) 35.0-35.9, adult: Secondary | ICD-10-CM

## 2013-12-22 DIAGNOSIS — F22 Delusional disorders: Secondary | ICD-10-CM | POA: Diagnosis present

## 2013-12-22 DIAGNOSIS — N184 Chronic kidney disease, stage 4 (severe): Secondary | ICD-10-CM | POA: Diagnosis not present

## 2013-12-22 DIAGNOSIS — Z008 Encounter for other general examination: Secondary | ICD-10-CM | POA: Diagnosis not present

## 2013-12-22 DIAGNOSIS — Z91199 Patient's noncompliance with other medical treatment and regimen due to unspecified reason: Secondary | ICD-10-CM

## 2013-12-22 DIAGNOSIS — R931 Abnormal findings on diagnostic imaging of heart and coronary circulation: Secondary | ICD-10-CM

## 2013-12-22 DIAGNOSIS — R000 Tachycardia, unspecified: Secondary | ICD-10-CM

## 2013-12-22 DIAGNOSIS — Z5189 Encounter for other specified aftercare: Secondary | ICD-10-CM | POA: Diagnosis not present

## 2013-12-22 DIAGNOSIS — I110 Hypertensive heart disease with heart failure: Secondary | ICD-10-CM | POA: Diagnosis present

## 2013-12-22 DIAGNOSIS — Z992 Dependence on renal dialysis: Secondary | ICD-10-CM | POA: Diagnosis not present

## 2013-12-22 DIAGNOSIS — E1129 Type 2 diabetes mellitus with other diabetic kidney complication: Secondary | ICD-10-CM | POA: Diagnosis present

## 2013-12-22 DIAGNOSIS — E1139 Type 2 diabetes mellitus with other diabetic ophthalmic complication: Secondary | ICD-10-CM | POA: Diagnosis present

## 2013-12-22 DIAGNOSIS — K5909 Other constipation: Secondary | ICD-10-CM | POA: Diagnosis present

## 2013-12-22 DIAGNOSIS — I059 Rheumatic mitral valve disease, unspecified: Secondary | ICD-10-CM | POA: Diagnosis not present

## 2013-12-22 DIAGNOSIS — R269 Unspecified abnormalities of gait and mobility: Secondary | ICD-10-CM | POA: Diagnosis not present

## 2013-12-22 DIAGNOSIS — Z01810 Encounter for preprocedural cardiovascular examination: Secondary | ICD-10-CM | POA: Diagnosis not present

## 2013-12-22 DIAGNOSIS — Z23 Encounter for immunization: Secondary | ICD-10-CM

## 2013-12-22 DIAGNOSIS — R9389 Abnormal findings on diagnostic imaging of other specified body structures: Secondary | ICD-10-CM | POA: Diagnosis not present

## 2013-12-22 DIAGNOSIS — K5900 Constipation, unspecified: Secondary | ICD-10-CM

## 2013-12-22 DIAGNOSIS — I129 Hypertensive chronic kidney disease with stage 1 through stage 4 chronic kidney disease, or unspecified chronic kidney disease: Secondary | ICD-10-CM | POA: Diagnosis not present

## 2013-12-22 DIAGNOSIS — N289 Disorder of kidney and ureter, unspecified: Secondary | ICD-10-CM | POA: Diagnosis not present

## 2013-12-22 LAB — CBC WITH DIFFERENTIAL/PLATELET
Basophils Absolute: 0.1 10*3/uL (ref 0.0–0.1)
Basophils Relative: 1 % (ref 0–1)
Eosinophils Absolute: 0 10*3/uL (ref 0.0–0.7)
Eosinophils Relative: 0 % (ref 0–5)
HCT: 32.2 % — ABNORMAL LOW (ref 36.0–46.0)
Hemoglobin: 10.8 g/dL — ABNORMAL LOW (ref 12.0–15.0)
Lymphocytes Relative: 26 % (ref 12–46)
Lymphs Abs: 1.4 10*3/uL (ref 0.7–4.0)
MCH: 30.3 pg (ref 26.0–34.0)
MCHC: 33.5 g/dL (ref 30.0–36.0)
MCV: 90.2 fL (ref 78.0–100.0)
Monocytes Absolute: 0.5 10*3/uL (ref 0.1–1.0)
Monocytes Relative: 10 % (ref 3–12)
Neutro Abs: 3.3 10*3/uL (ref 1.7–7.7)
Neutrophils Relative %: 63 % (ref 43–77)
Platelets: 324 10*3/uL (ref 150–400)
RBC: 3.57 MIL/uL — ABNORMAL LOW (ref 3.87–5.11)
RDW: 12.8 % (ref 11.5–15.5)
WBC: 5.3 10*3/uL (ref 4.0–10.5)

## 2013-12-22 LAB — URINALYSIS, ROUTINE W REFLEX MICROSCOPIC
Bilirubin Urine: NEGATIVE
Glucose, UA: 250 mg/dL — AB
Ketones, ur: 15 mg/dL — AB
Leukocytes, UA: NEGATIVE
Nitrite: NEGATIVE
Protein, ur: >300 mg/dL — AB
Specific Gravity, Urine: 1.026 (ref 1.005–1.030)
Urobilinogen, UA: 0.2 mg/dL (ref 0.0–1.0)
pH: 6 (ref 5.0–8.0)

## 2013-12-22 LAB — BASIC METABOLIC PANEL
BUN: 55 mg/dL — ABNORMAL HIGH (ref 6–23)
CO2: 20 mEq/L (ref 19–32)
Calcium: 7.7 mg/dL — ABNORMAL LOW (ref 8.4–10.5)
Chloride: 110 mEq/L (ref 96–112)
Creatinine, Ser: 3.38 mg/dL — ABNORMAL HIGH (ref 0.50–1.10)
GFR calc Af Amer: 16 mL/min — ABNORMAL LOW (ref >90)
GFR calc non Af Amer: 14 mL/min — ABNORMAL LOW (ref >90)
Glucose, Bld: 104 mg/dL — ABNORMAL HIGH (ref 70–99)
Potassium: 3.7 mEq/L (ref 3.5–5.1)
Sodium: 142 mEq/L (ref 135–145)

## 2013-12-22 LAB — CREATININE, URINE, RANDOM: Creatinine, Urine: 95.89 mg/dL

## 2013-12-22 LAB — URINE MICROSCOPIC-ADD ON

## 2013-12-22 LAB — TROPONIN I: Troponin I: <0.3 ng/mL (ref <0.30)

## 2013-12-22 LAB — POCT I-STAT TROPONIN I: Troponin i, poc: 0.09 ng/mL (ref 0.00–0.08)

## 2013-12-22 LAB — SODIUM, URINE, RANDOM: Sodium, Ur: 51 mEq/L

## 2013-12-22 MED ORDER — DEXTROSE 5 % IV SOLN
500.0000 mg | Freq: Once | INTRAVENOUS | Status: AC
  Administered 2013-12-22: 500 mg via INTRAVENOUS

## 2013-12-22 MED ORDER — HYDRALAZINE HCL 20 MG/ML IJ SOLN
10.0000 mg | Freq: Four times a day (QID) | INTRAMUSCULAR | Status: DC | PRN
  Administered 2013-12-23 – 2014-01-07 (×12): 10 mg via INTRAVENOUS
  Filled 2013-12-22 (×14): qty 1

## 2013-12-22 MED ORDER — DEXTROSE 5 % IV SOLN: 500.0000 mg | Freq: Once | INTRAVENOUS | Status: DC

## 2013-12-22 MED ORDER — SODIUM CHLORIDE 0.9 % IV SOLN
999.0000 mL | INTRAVENOUS | Status: DC
  Administered 2013-12-22: 1000 mL via INTRAVENOUS

## 2013-12-22 MED ORDER — NITROGLYCERIN 0.4 MG SL SUBL
0.4000 mg | SUBLINGUAL_TABLET | SUBLINGUAL | Status: DC | PRN
  Filled 2013-12-22: qty 25

## 2013-12-22 MED ORDER — AMLODIPINE BESYLATE 10 MG PO TABS
10.0000 mg | ORAL_TABLET | Freq: Every day | ORAL | Status: DC
  Administered 2013-12-23 – 2013-12-25 (×4): 10 mg via ORAL
  Filled 2013-12-22 (×5): qty 1

## 2013-12-22 MED ORDER — DILTIAZEM HCL 25 MG/5ML IV SOLN
20.0000 mg | Freq: Once | INTRAVENOUS | Status: DC
  Administered 2013-12-22: 20 mg via INTRAVENOUS
  Filled 2013-12-22: qty 5

## 2013-12-22 MED ORDER — METOPROLOL TARTRATE 25 MG PO TABS
25.0000 mg | ORAL_TABLET | Freq: Two times a day (BID) | ORAL | Status: DC
  Administered 2013-12-23 – 2013-12-24 (×5): 25 mg via ORAL
  Filled 2013-12-22 (×7): qty 1

## 2013-12-22 MED ORDER — LISINOPRIL 10 MG PO TABS: 10.0000 mg | ORAL_TABLET | Freq: Once | ORAL | Status: DC

## 2013-12-22 MED ORDER — DEXTROSE 5 % IV SOLN
1.0000 g | Freq: Once | INTRAVENOUS | Status: AC
  Administered 2013-12-22: 1 g via INTRAVENOUS
  Filled 2013-12-22: qty 10

## 2013-12-22 MED ORDER — ASPIRIN 81 MG PO CHEW
324.0000 mg | CHEWABLE_TABLET | Freq: Once | ORAL | Status: AC
  Administered 2013-12-22: 324 mg via ORAL
  Filled 2013-12-22: qty 4

## 2013-12-22 MED ORDER — HYDRALAZINE HCL 20 MG/ML IJ SOLN
5.0000 mg | Freq: Once | INTRAMUSCULAR | Status: AC
  Administered 2013-12-22: 5 mg via INTRAVENOUS
  Filled 2013-12-22: qty 1

## 2013-12-22 NOTE — H&P (Addendum)
Patient's PCP: [facility]  Chief Complaint: Short of breath and tachycardia  History of Present Illness: [name] is a 57 y.o. 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  Sign interpreter was used to help translate.  Patient is a poor historian.  She reports that she has been feeling weak since the summer.  However, after Christmas she developed cough, sinus congestion and drainage.  She initially went to [facility].  She was found to be tachycardic with heart rate of 170s.  She was given diltiazem without relief.  She was given adenosine which broke her out of the rhythm to a sinus rhythm.  Chest x-ray showed right perihilar airspace disease concerning for pneumonia.  Hospitalist service was asked to admit the patient for further care and management.  Currently feels better, complaining of generalized weakness.  Felt nauseated earlier today but has not vomited.  Currently denies any chest pain.  Denies any abdominal pain or diarrhea.  Review of Systems: All systems reviewed with the patient and positive as per history of present illness, otherwise all other negative.  Past Medical History  Diagnosis Date  . Hyperlipidemia   . Hypertension   . Deaf     Lives with husband who is apparently also non-verbal. Has a daughter who can help with medicines and directions [name] [phone]) but is sometimes overwhelmed by the patient's issues.  . Bilateral leg edema   . Psychiatric disorder     She frequently exhibits paranoia and has been diagnosed with psychotic d/o NOS during hospital stay in the past. She is tangetial and perseverative during her visits. She apparently has had a bad experience with mental health in [place] in the past and refuses to discuss mental issues for fear that she will be sent back  there. Her paranoia, communication issues, financial woes and lack of fam  . Fibroid uterus   . Anemia     Due to fibroids.  . Heart murmur   . Diabetes mellitus     pt reports Dr told her no diabetes   Past Surgical History  Procedure Laterality Date  . No past surgeries     Family History  Problem Relation Age of Onset  . Other Father     Drowned from fishing?   History   Social History  . Marital Status: Married    Spouse Name: N/A    Number of Children: N/A  . Years of Education: N/A   Occupational History  . Not on file.   Social History Main Topics  . Smoking status: Never Smoker   . Smokeless tobacco: Not on file  . Alcohol Use: No  . Drug Use: No  . Sexual Activity: Not on file   Other Topics Concern  . Not on file   Social History Narrative  . No narrative on file   Allergies: Review of patient's allergies indicates no known allergies.  Home Meds: Prior to Admission medications   Not on File    Physical Exam: Blood pressure 199/98, pulse 89, temperature 99.8 F (37.7 C), temperature source Rectal, resp. rate 26, height 5\' 4"  (1.626 m), weight 90.719 kg (200 lb), SpO2 97.00%. General: Awake, Oriented x3, No acute distress somewhat tangential. HEENT: EOMI, Moist mucous membranes Neck: Supple CV: S1 and S2 Lungs: Coarse breath sounds with crackles at bases bilaterally.  Moderate air movement.  Abdomen: Soft, Nontender, Nondistended, +bowel sounds. Ext: Good pulses.  1+ edema. No clubbing or cyanosis noted. Neuro: Cranial Nerves II-XII grossly intact. Has 5/5 motor strength in upper and lower extremities.  Lab results:  Recent Labs  12/22/13 1908  NA 142  K 3.7  CL 110  CO2 20  GLUCOSE 104*  BUN 55*  CREATININE 3.38*  CALCIUM 7.7*   No results found for this basename: AST, ALT, ALKPHOS, BILITOT, PROT, ALBUMIN,  in the last 72 hours No results found for this basename: LIPASE, AMYLASE,  in the last 72 hours  Recent Labs  12/22/13 1908   WBC 5.3  NEUTROABS 3.3  HGB 10.8*  HCT 32.2*  MCV 90.2  PLT 324    Recent Labs  12/22/13 2026  TROPONINI <0.30   No components found with this basename: POCBNP,  No results found for this basename: DDIMER,  in the last 72 hours No results found for this basename: HGBA1C,  in the last 72 hours No results found for this basename: CHOL, HDL, LDLCALC, TRIG, CHOLHDL, LDLDIRECT,  in the last 72 hours No results found for this basename: TSH, T4TOTAL, FREET3, T3FREE, THYROIDAB,  in the last 72 hours No results found for this basename: VITAMINB12, FOLATE, FERRITIN, TIBC, IRON, RETICCTPCT,  in the last 72 hours Imaging results:  Dg Chest 2 View  12/22/2013   CLINICAL DATA:  Irregular heartbeat, elevated blood pressure  EXAM: CHEST  2 VIEW  COMPARISON:  02/02/2007  FINDINGS: There is right perihilar airspace disease concerning for pneumonia. There is no other focal parenchymal opacity, pleural effusion, or pneumothorax. The heart and mediastinal contours are unremarkable.  The osseous structures are unremarkable.  IMPRESSION: Right perihilar airspace disease concerning for pneumonia. Recommend followup radiography in 4-6 weeks, to document complete resolution following adequate medical therapy. If there is not complete resolution, then recommend further evaluation with CT of the chest to exclude underlying pathology.   Electronically Signed   By: Kathreen Devoid   On: 12/22/2013 18:25   Assessment & Plan by Problem: Acute respiratory failure due to community-acquired pneumonia, likely superimposed with upper respiratory viral infection. Start patient on ceftriaxone and azithromycin.  Blood cultures x2 sent.  Send HIV and respiratory culture.  Further titration of antibiotics depending on urine culture results and sensitivities.  Influenza PCR pending.  Tachycardia likely due to supraventricular tachycardia Likely triggered by above.  Started patient on oral metoprolol.  Requested echocardiogram in  the morning.  Cycling troponins.  Initial troponin negative (point-of-care was positive).  Continue aspirin.  Diabetes? Check hemoglobin A1c in the morning.  Sliding scale insulin for now.  Uncontrolled hypertension Blood pressure in the 200s in the emergency department.  Patient reports noncompliance with her antihypertensive medications.  Discussed with patient about being compliant with her medications.  Started on amlodipine, in addition to metoprolol.  When necessary hydralazine.  Further titration of antihypertensive medications depending on patient's clinical course.  Hold ACE/ARB due to acute renal failure.  Acute renal failure Unclear etiology.  Will hydrate the patient on IV fluids.  May have component of prerenal etiology.  Will also get a renal ultrasound in the morning to evaluate for obstruction.  Suspect uncontrolled hypertension may have been treated to renal failure.  Reassess renal function in the morning with hydration.  Chronic lower extremity edema Stable.  Continue to monitor.  Psychiatry disorder not otherwise specified Continue to monitor.  Patient is tangential.  Generalized weakness Requests physical therapy consultation.  Likely due to  above.  Deaf Chronic. Stable.  Prophylaxis Subcutaneous heparin.  CODE STATUS Full code.  Disposition Admit the patient to step down as inpatient for close monitoring.  Time spent on admission, talking to the patient, and coordinating care was: 50 mins.  Karna Abed A, MD 12/22/2013, 11:27 PM

## 2013-12-22 NOTE — MAU Provider Note (Signed)
History     CSN: [id number]  Arrival date and time: 12/22/13 1415   None     No chief complaint on file.  HPI [name] is 57 y.o. presents with chest pain by her gesture of hand on the right chest wall. She is deaf and came with no interpreter.  Her husband is here but is deaf as well.  She also shakes her head "no" when written questions are presented.  Interpreter arrived and states that she knows the patient.  The patient states she has had discomfort X 1 week but got worse today.  She sees Dr. Florene Glen.  She does not take her blood pressure medication as instructed because she doesn't like the way it makes her feel.  She has hx of elevated cholesterol, hypertension, diabetes, edema, psychiatric dx and heart murmur.  She is alert.     Past Medical History  Diagnosis Date  . Hyperlipidemia   . Hypertension   . Diabetes mellitus   . Deaf     Lives with husband who is apparently also non-verbal. Has a daughter who can help with medicines and directions [name] [phone]) but is sometimes overwhelmed by the patient's issues.  . Bilateral leg edema   . Psychiatric disorder     She frequently exhibits paranoia and has been diagnosed with psychotic d/o NOS during hospital stay in the past. She is tangetial and perseverative during her visits. She apparently has had a bad experience with mental health in [place] in the past and refuses to discuss mental issues for fear that she will be sent back there. Her paranoia, communication issues, financial woes and lack of fam  . Fibroid uterus   . Anemia     Due to fibroids.  . Heart murmur     Past Surgical History  Procedure Laterality Date  . No past surgeries      No family history on file.  History  Substance Use Topics  . Smoking status: Never Smoker   . Smokeless tobacco: Not on file  . Alcohol Use: No    Allergies: No Known Allergies  Prescriptions prior to admission  Medication Sig Dispense Refill  . ferrous  sulfate 325 (65 FE) MG tablet Take 325 mg by mouth daily with breakfast.      . lisinopril (PRINIVIL,ZESTRIL) 10 MG tablet Take 1 tablet (10 mg total) by mouth daily.  30 tablet  0  . metFORMIN (GLUCOPHAGE) 500 MG tablet Take 1 tablet (500 mg total) by mouth 2 (two) times daily with a meal.  60 tablet  0  . Multiple Vitamins-Minerals (MULTIVITAMIN WITH MINERALS) tablet Take 1 tablet by mouth daily.      . simvastatin (ZOCOR) 40 MG tablet Take 40 mg by mouth every evening.        Review of Systems  Constitutional: Negative for fever and chills.  Respiratory: Positive for wheezing.   Cardiovascular: Positive for chest pain and leg swelling.  Psychiatric/Behavioral: The patient is nervous/anxious.    Physical Exam   Blood pressure 204/140, pulse 172, temperature 98.6 F (37 C), temperature source Oral, resp. rate 18, SpO2 91.00%.  Physical Exam  Constitutional: She is oriented to person, place, and time. She appears well-developed and well-nourished. She appears distressed.  Patient is deaf  HENT:  Head: Normocephalic.  Neck: Normal range of motion.  Cardiovascular:  tachycardic  Respiratory: She has wheezes.  GI: She exhibits no distension and no mass. There is no tenderness. There is no  rebound and no guarding.  Neurological: She is alert and oriented to person, place, and time.  Skin: Skin is warm and dry. She is not diaphoretic.  Psychiatric:  anxious    MAU Course  Procedures  CARDIZEM was given by EMS--readily available in their bag.  Pharmacy order was cancelled.  MDM Dr. Leanne Lovely, ED MD was consulted regarding tachycardia and elevated BP.   Order given for Cardizem 20mg  IV over 2 minutes and he accepted transfer of care to [facility].   IV begun, EKG obtained, ASA given.  Assessment and Plan  A:  Chest pain      SVT     Elevated blood pressure  P:  Care was begun here, stablized and transferred to [facility] for further evaluation and treatment.  KEY,EVE M 12/22/2013, 3:52  PM

## 2013-12-22 NOTE — MAU Note (Signed)
Called again for interpretor. Pt does not have  Her glasses with her so can not read.

## 2013-12-22 NOTE — MAU Note (Signed)
Arrived with c/o overall fatigue that is worse today. Says she refuses to take BP med because it is too strong.  Edema in both feet and ankles; expiratory wheeze noted audibly.  O2 sat on room air 91% .

## 2013-12-22 NOTE — ED Provider Notes (Signed)
CSN: [id number]     Arrival date & time 12/22/13  1415 History   First MD Initiated Contact with Patient 12/22/13 1637     Chief Complaint  Patient presents with  . Irregular Heart Beat   (Consider location/radiation/quality/duration/timing/severity/associated sxs/prior Treatment) HPI  This a 57 year old female with a history of hypertension, hyperlipidemia, diabetes, and deafness who [facility] to [facility] hospital and was noted to be in SVT with a blood pressure of 200/140. Per EMS, patient heart rate got into the 160s. Patient was initially given Cardizem without any improvement. Adenosine 12 mg was ordered by my colleague and the patient reverted to sinus rhythm. She's currently in sinus rhythm with a rate of 99. Patient is deaf and translator is at the bedside. Patient denies any chest pain or shortness of breath. Patient reports generalized weakness. She has also had congestion over the last 3-4 days. She denies taking any cold medications including Sudafed.  She denies any fevers. Patient is noncompliant with her blood pressure medications.  Past Medical History  Diagnosis Date  . Hyperlipidemia   . Hypertension   . Deaf     Lives with husband who is apparently also non-verbal. Has a daughter who can help with medicines and directions [name] [phone]) but is sometimes overwhelmed by the patient's issues.  . Bilateral leg edema   . Psychiatric disorder     She frequently exhibits paranoia and has been diagnosed with psychotic d/o NOS during hospital stay in the past. She is tangetial and perseverative during her visits. She apparently has had a bad experience with mental health in [place] in the past and refuses to discuss mental issues for fear that she will be sent back there. Her paranoia, communication issues, financial woes and lack of fam  . Fibroid uterus   . Anemia     Due to fibroids.  . Heart murmur    . Diabetes mellitus     pt reports Dr told her no diabetes   Past Surgical History  Procedure Laterality Date  . No past surgeries     Family History  Problem Relation Age of Onset  . Other Father     Drowned from fishing?   History  Substance Use Topics  . Smoking status: Never Smoker   . Smokeless tobacco: Not on file  . Alcohol Use: No   OB History   Grav Para Term Preterm Abortions TAB SAB Ect Mult Living   1 1        1      Review of Systems  Constitutional: Negative for fever.  HENT: Positive for congestion.   Respiratory: Negative for cough, chest tightness and shortness of breath.   Cardiovascular: Negative for chest pain.  Gastrointestinal: Negative for nausea, vomiting and abdominal pain.  Genitourinary: Negative for dysuria.  Musculoskeletal: Negative for back pain.  Skin: Negative for wound.  Neurological: Negative for headaches.  Psychiatric/Behavioral: Negative for confusion.  All other systems reviewed and are negative.    Allergies  Review of patient's allergies indicates no known allergies.  Home Medications   No current outpatient prescriptions on file. BP 183/87  Pulse 96  Temp(Src) 99.8 F (37.7 C) (Rectal)  Resp 16  Ht 5\' 4"  (1.626 m)  Wt 200 lb (90.719 kg)  BMI 34.31 kg/m2  SpO2 88% Physical Exam  Nursing note and vitals reviewed. Constitutional: She appears well-developed and well-nourished.  HENT:  Head: Normocephalic and atraumatic.  Eyes:  Pupils are equal, round, and reactive to light.  Neck: Neck supple.  Cardiovascular: Normal rate, regular rhythm and normal heart sounds.   No murmur heard. Pulmonary/Chest: Effort normal and breath sounds normal. No respiratory distress. She has no wheezes.  Abdominal: Soft. Bowel sounds are normal. There is no tenderness. There is no rebound.  Musculoskeletal: She exhibits no edema.  Neurological:  Follows commands, alert, moves all 4 extremities  Skin: Skin is warm and dry.   Psychiatric: She has a normal mood and affect.    ED Course  Procedures (including critical care time) Labs Review Labs Reviewed  CBC WITH DIFFERENTIAL - Abnormal; Notable for the following:    RBC 3.57 (*)    Hemoglobin 10.8 (*)    HCT 32.2 (*)    All other components within normal limits  BASIC METABOLIC PANEL - Abnormal; Notable for the following:    Glucose, Bld 104 (*)    BUN 55 (*)    Creatinine, Ser 3.38 (*)    Calcium 7.7 (*)    GFR calc non Af Amer 14 (*)    GFR calc Af Amer 16 (*)    All other components within normal limits  URINALYSIS, ROUTINE W REFLEX MICROSCOPIC - Abnormal; Notable for the following:    APPearance CLOUDY (*)    Glucose, UA 250 (*)    Hgb urine dipstick MODERATE (*)    Ketones, ur 15 (*)    Protein, ur >300 (*)    All other components within normal limits  URINE MICROSCOPIC-ADD ON - Abnormal; Notable for the following:    Squamous Epithelial / LPF FEW (*)    Bacteria, UA MANY (*)    Casts GRANULAR CAST (*)    All other components within normal limits  POCT I-STAT TROPONIN I - Abnormal; Notable for the following:    Troponin i, poc 0.09 (*)    All other components within normal limits  CULTURE, BLOOD (ROUTINE X 2)  CULTURE, BLOOD (ROUTINE X 2)  TROPONIN I  INFLUENZA PANEL BY PCR  SODIUM, URINE, RANDOM  CREATININE, URINE, RANDOM   Imaging Review Dg Chest 2 View  12/22/2013   CLINICAL DATA:  Irregular heartbeat, elevated blood pressure  EXAM: CHEST  2 VIEW  COMPARISON:  02/02/2007  FINDINGS: There is right perihilar airspace disease concerning for pneumonia. There is no other focal parenchymal opacity, pleural effusion, or pneumothorax. The heart and mediastinal contours are unremarkable.  The osseous structures are unremarkable.  IMPRESSION: Right perihilar airspace disease concerning for pneumonia. Recommend followup radiography in 4-6 weeks, to document complete resolution following adequate medical therapy. If there is not complete  resolution, then recommend further evaluation with CT of the chest to exclude underlying pathology.   Electronically Signed   By: Kathreen Devoid   On: 12/22/2013 18:25    EKG Interpretation    Date/Time:  Monday December 22 2013 16:53:12 EST Ventricular Rate:  98 PR Interval:  112 QRS Duration: 81 QT Interval:  370 QTC Calculation: 472 R Axis:   43 Text Interpretation:  Sinus rhythm Borderline short PR interval Probable left atrial enlargement Borderline T abnormalities, inferior leads SVT resolved Confirmed by HORTON  MD, COURTNEY ([id number]) on 12/22/2013 5:08:11 PM            MDM   1. Chest pain   2. Paroxysmal supraventricular tachycardia   3. Tachycardia   4. Community acquired pneumonia   5. Acute kidney injury    Patient presents with tachycardia. Thought to  be in SVT prior to arrival. Patient responded to adenosine. Patient also noted to be hypertensive. She is admittedly noncompliant with her blood pressure medications. Work up is notable for mild elevation in troponin at 0.09. Patient has not had any chest pain. X-ray concerning for pneumonia. Patient was given Rocephin and azithromycin given recent complaints of congestion and URI symptoms. Blood cultures obtained. Patient also noted to have an acute elevation in her creatinine greater than 3.  Patient to be admitted for further management of multiple medical problems.    Merryl Hacker, MD 12/22/13 [phone]

## 2013-12-22 NOTE — ED Notes (Signed)
Dr.  Dina Rich made aware of elevated I-stat Trop result

## 2013-12-22 NOTE — MAU Note (Signed)
Pt pointing at head

## 2013-12-22 NOTE — MAU Note (Signed)
Call received, interpretor found will be here in 5-10min.

## 2013-12-22 NOTE — ED Notes (Signed)
Pt. Transferred from [facility] with SVT and HTN.   Pt. Has hx of HTN not SVt.  Pt. Arrived in NSR Received Cardizem 20 mg Iv with no change and then gave Adenosine 12 mg IV no pause but hr, came down.  BP continues to be elevated. Pt. Denies any sob or chest pain.  Pt. Has had a cold in the last few days with a non-productive cough.    Pt. Is deaf and interrupter at the bedside.  Pt. Is alert and oriented X4.

## 2013-12-23 ENCOUNTER — Inpatient Hospital Stay (HOSPITAL_COMMUNITY): Payer: Medicare Other

## 2013-12-23 DIAGNOSIS — I059 Rheumatic mitral valve disease, unspecified: Secondary | ICD-10-CM

## 2013-12-23 DIAGNOSIS — E1122 Type 2 diabetes mellitus with diabetic chronic kidney disease: Secondary | ICD-10-CM | POA: Diagnosis present

## 2013-12-23 LAB — CBC
HCT: 29.2 % — ABNORMAL LOW (ref 36.0–46.0)
Hemoglobin: 9.8 g/dL — ABNORMAL LOW (ref 12.0–15.0)
MCH: 30.1 pg (ref 26.0–34.0)
MCHC: 33.6 g/dL (ref 30.0–36.0)
MCV: 89.6 fL (ref 78.0–100.0)
Platelets: 316 10*3/uL (ref 150–400)
RBC: 3.26 MIL/uL — ABNORMAL LOW (ref 3.87–5.11)
RDW: 12.8 % (ref 11.5–15.5)
WBC: 5.9 10*3/uL (ref 4.0–10.5)

## 2013-12-23 LAB — INFLUENZA PANEL BY PCR (TYPE A & B)
H1N1 flu by pcr: NOT DETECTED
Influenza A By PCR: NEGATIVE
Influenza B By PCR: NEGATIVE

## 2013-12-23 LAB — TROPONIN I
Troponin I: <0.3 ng/mL (ref <0.30)
Troponin I: <0.3 ng/mL (ref <0.30)

## 2013-12-23 LAB — GLUCOSE, CAPILLARY
Glucose-Capillary: 124 mg/dL — ABNORMAL HIGH (ref 70–99)
Glucose-Capillary: 204 mg/dL — ABNORMAL HIGH (ref 70–99)
Glucose-Capillary: 166 mg/dL — ABNORMAL HIGH (ref 70–99)
Glucose-Capillary: 193 mg/dL — ABNORMAL HIGH (ref 70–99)

## 2013-12-23 LAB — BASIC METABOLIC PANEL
BUN: 59 mg/dL — ABNORMAL HIGH (ref 6–23)
CO2: 19 mEq/L (ref 19–32)
Calcium: 7.5 mg/dL — ABNORMAL LOW (ref 8.4–10.5)
Chloride: 105 mEq/L (ref 96–112)
Creatinine, Ser: 3.47 mg/dL — ABNORMAL HIGH (ref 0.50–1.10)
GFR calc Af Amer: 16 mL/min — ABNORMAL LOW (ref >90)
GFR calc non Af Amer: 14 mL/min — ABNORMAL LOW (ref >90)
Glucose, Bld: 127 mg/dL — ABNORMAL HIGH (ref 70–99)
Potassium: 3.8 mEq/L (ref 3.7–5.3)
Sodium: 138 mEq/L (ref 137–147)

## 2013-12-23 LAB — HEMOGLOBIN A1C
Hgb A1c MFr Bld: 5.9 % — ABNORMAL HIGH (ref <5.7)
Mean Plasma Glucose: 123 mg/dL — ABNORMAL HIGH (ref <117)

## 2013-12-23 LAB — MRSA PCR SCREENING: MRSA by PCR: NEGATIVE

## 2013-12-23 LAB — HIV ANTIBODY (ROUTINE TESTING W REFLEX): HIV: NONREACTIVE

## 2013-12-23 MED ORDER — ASPIRIN 325 MG PO TABS
325.0000 mg | ORAL_TABLET | Freq: Every day | ORAL | Status: DC
  Administered 2013-12-23 – 2014-01-08 (×17): 325 mg via ORAL
  Filled 2013-12-23 (×17): qty 1

## 2013-12-23 MED ORDER — ONDANSETRON HCL 4 MG/2ML IJ SOLN: 4.0000 mg | Freq: Four times a day (QID) | INTRAMUSCULAR | Status: DC | PRN

## 2013-12-23 MED ORDER — HYDRALAZINE HCL 20 MG/ML IJ SOLN
10.0000 mg | Freq: Once | INTRAMUSCULAR | Status: AC
  Administered 2013-12-23: 10 mg via INTRAVENOUS
  Filled 2013-12-23: qty 1

## 2013-12-23 MED ORDER — ACETAMINOPHEN 325 MG PO TABS
650.0000 mg | ORAL_TABLET | Freq: Four times a day (QID) | ORAL | Status: DC | PRN
  Administered 2013-12-26: 650 mg via ORAL
  Filled 2013-12-23 (×2): qty 2

## 2013-12-23 MED ORDER — INSULIN ASPART 100 UNIT/ML ~~LOC~~ SOLN
0.0000 [IU] | Freq: Three times a day (TID) | SUBCUTANEOUS | Status: DC
  Administered 2013-12-23: 2 [IU] via SUBCUTANEOUS
  Administered 2013-12-23: 3 [IU] via SUBCUTANEOUS
  Administered 2013-12-24 – 2013-12-29 (×7): 1 [IU] via SUBCUTANEOUS
  Administered 2014-01-03: 2 [IU] via SUBCUTANEOUS
  Administered 2014-01-04 – 2014-01-06 (×5): 1 [IU] via SUBCUTANEOUS
  Administered 2014-01-06 (×2): 2 [IU] via SUBCUTANEOUS
  Administered 2014-01-07: 1 [IU] via SUBCUTANEOUS
  Administered 2014-01-07: 2 [IU] via SUBCUTANEOUS
  Administered 2014-01-08 (×2): 1 [IU] via SUBCUTANEOUS

## 2013-12-23 MED ORDER — AZITHROMYCIN 500 MG PO TABS
500.0000 mg | ORAL_TABLET | ORAL | Status: DC
  Administered 2013-12-23 – 2013-12-26 (×4): 500 mg via ORAL
  Filled 2013-12-23 (×6): qty 1

## 2013-12-23 MED ORDER — INSULIN ASPART 100 UNIT/ML ~~LOC~~ SOLN: 0.0000 [IU] | Freq: Every day | SUBCUTANEOUS | Status: DC

## 2013-12-23 MED ORDER — DEXTROSE 5 % IV SOLN
1.0000 g | INTRAVENOUS | Status: DC
  Administered 2013-12-23 – 2013-12-26 (×4): 1 g via INTRAVENOUS
  Filled 2013-12-23 (×6): qty 10

## 2013-12-23 MED ORDER — SODIUM CHLORIDE 0.9 % IV SOLN
999.0000 mL | INTRAVENOUS | Status: AC
  Administered 2013-12-23 (×2): 999 mL via INTRAVENOUS

## 2013-12-23 MED ORDER — HEPARIN SODIUM (PORCINE) 5000 UNIT/ML IJ SOLN
5000.0000 [IU] | Freq: Three times a day (TID) | INTRAMUSCULAR | Status: DC
  Administered 2013-12-23 – 2014-01-06 (×39): 5000 [IU] via SUBCUTANEOUS
  Filled 2013-12-23 (×46): qty 1

## 2013-12-23 MED ORDER — SODIUM CHLORIDE 0.9 % IJ SOLN
3.0000 mL | Freq: Two times a day (BID) | INTRAMUSCULAR | Status: DC
  Administered 2013-12-23 – 2013-12-30 (×11): 3 mL via INTRAVENOUS
  Administered 2013-12-30: 21:00:00 via INTRAVENOUS
  Administered 2013-12-31: 3 mL via INTRAVENOUS
  Administered 2013-12-31: via INTRAVENOUS
  Administered 2014-01-01 – 2014-01-08 (×12): 3 mL via INTRAVENOUS

## 2013-12-23 MED ORDER — ONDANSETRON HCL 4 MG PO TABS: 4.0000 mg | ORAL_TABLET | Freq: Four times a day (QID) | ORAL | Status: DC | PRN

## 2013-12-23 MED ORDER — ACETAMINOPHEN 650 MG RE SUPP: 650.0000 mg | Freq: Four times a day (QID) | RECTAL | Status: DC | PRN

## 2013-12-23 MED ORDER — LEVALBUTEROL HCL 0.63 MG/3ML IN NEBU
0.6300 mg | INHALATION_SOLUTION | Freq: Three times a day (TID) | RESPIRATORY_TRACT | Status: DC
  Administered 2013-12-23 – 2013-12-25 (×7): 0.63 mg via RESPIRATORY_TRACT
  Filled 2013-12-23 (×17): qty 3

## 2013-12-23 MED ORDER — GUAIFENESIN ER 600 MG PO TB12
600.0000 mg | ORAL_TABLET | Freq: Two times a day (BID) | ORAL | Status: DC
  Administered 2013-12-23 – 2013-12-25 (×6): 600 mg via ORAL
  Filled 2013-12-23 (×10): qty 1

## 2013-12-23 NOTE — Progress Notes (Signed)
Echo Lab  2D Echocardiogram completed.  [name] L Kimbella Heisler, RDCS 03-11-2013 10:20 AM

## 2013-12-23 NOTE — ED Notes (Signed)
IV team attempted IV start x2 IV-RNs via ultrasound and unsuccessful, Dr. Reece Levy notified.

## 2013-12-23 NOTE — Care Management Note (Signed)
    Page 1 of 2   01/08/2014     3:16:33 PM   CARE MANAGEMENT NOTE 01/08/2014  Patient:  [name], [name]   Account Number:  [id number]  Date Initiated:  2020-03-2913  Documentation initiated by:  Elissa Hefty  Subjective/Objective Assessment:   adm w htn, svt, pna     Action/Plan:   lives w husband, pt is deaf. pt has da also.  Husband is also deaf.   Anticipated DC Date:  01/08/2014   Anticipated DC Plan:  SKILLED NURSING FACILITY  In-house referral  Clinical Social Worker      DC Planning Services  CM consult      Choice offered to / List presented to:             Status of service:  Completed, signed off Medicare Important Message given?   (If response is "NO", the following Medicare IM given date fields will be blank) Date Medicare IM given:   Date Additional Medicare IM given:    Discharge Disposition:  [place]  Per UR Regulation:  Reviewed for med. necessity/level of care/duration of stay  If discussed at [place] of Stay Meetings, dates discussed:   12/30/2013  01/01/2014  01/06/2014  01/08/2014    Comments:  01/08/14 Almyra Free Jillian Warth,RN,BSN [id number] PT DISCHARGING TO [facility] TODAY, PER CSW ARRANGEMENTS.  12/29/13 Rickeya Manus,RN,BSN [id number] MET WITH PT, CSW AND INTERPRETER PRESENT.  DISCUSSED POSSIBLE NEED FOR SNF AT DC FOR REHAB, AS PT STILL QUITE WEAK.  PT AGREEABLE TO THIS PLAN, AS [place] AS IT IS TEMPORARY.  CSW TO FOLLOW UP WITH PT/FAMILY ON BED OFFERS.  12/28/13 16:55 CM spoke with pt's son, Leesha Kyzer, [phone] concerning home health.  [name] was very concerned about him mother going home as he works full time and asks to speak with a Education officer, museum to discuss alternative living situations for his Mother and eventually father.  CM made referral to CSW with Maurice's permission and gratitude.  [name] understands this referral will not be addressed until tomorrow.  CSW states she will pass it on to weekday CSW.  Will cointinue to follow  for disposition.  [name], BSN, [place] [phone]. 12/28/13 08:30 CM met with pt in room and [name], sign language interpreter to discuss [facility] with pt.  Pt was not cooperative enough for this CM to feel comfortable in arranging home health services at this time.  Pt to have SW consult and will continue to follow for discharge needs. [name], BSN, [place]  [phone].  12/26/13 Aloysuis Ribaudo,RN,BSN [id number] PSYCH CONSULT PENDING; REQUESTED BY DAUGHTER, AS PT HAS HX OF SCHIZOPHRENIA, NOT TREATED.  WILL FOLLOW.

## 2013-12-23 NOTE — Evaluation (Signed)
Physical Therapy Evaluation Patient Details Name: [name] MRN: [id number] DOB: 1956/04/14 Today's Date: Jul 03, 202014 Time: [id number] PT Time Calculation (min): 29 min  PT Assessment / Plan / Recommendation History of Present Illness  [name] is a 58 y.o. 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  Sign interpreter was used to help translate.  Patient is a poor historian.  She reports that she has been feeling weak since the summer.  However, after Christmas she developed cough, sinus congestion and drainage.  She initially went to [facility] health.  She was found to be tachycardic with heart rate of 170s.  She was given diltiazem without relief.  She was given adenosine which broke her out of the rhythm to a sinus rhythm.  Chest x-ray showed right perihilar airspace disease concerning for pneumonia.  Hospitalist service was asked to admit the patient for further care and management.  Currently feels better, complaining of generalized weakness.  Felt nauseated earlier today but has not vomited.  Currently denies any chest pain.  Denies any abdominal pain or diarrhea.  Clinical Impression   Pt admitted with above. Pt currently with functional limitations due to the deficits listed below (see PT Problem List).  Pt will benefit from skilled PT to increase their independence and safety with mobility to allow discharge to the venue listed below.       PT Assessment  Patient needs continued PT services    Follow Up Recommendations  Home health PT;Supervision - Intermittent    Does the patient have the potential to tolerate intense rehabilitation      Barriers to Discharge Inaccessible home environment third story walk-up    Equipment Recommendations  3in1 (PT);Other (comment) (TBD; hopefully pt will not need RW,  but will monitor for progress and rec if needed)    Recommendations for Other Services OT consult   Frequency Min 3X/week    Precautions / Restrictions Precautions Precautions: Fall   Pertinent Vitals/Pain Significantly hypertensive during session; RN aware See vitals flow sheet.       Mobility  Bed Mobility Bed Mobility: Supine to Sit Supine to Sit: 4: Min assist;With rails Details for Bed Mobility Assistance: Cues to initiate; Assist to clear feet from bed and elevate trunk to sitting Transfers Transfers: Sit to Stand;Stand to Sit Sit to Stand: 3: Mod assist;From bed;With upper extremity assist Stand to Sit: 4: Min assist;To chair/3-in-1 Details for Transfer Assistance: Lift off assist with sit to stand; good control with stand to sit; Pt very anxious and repeated that she feels sick and weak Ambulation/Gait Ambulation/Gait Assistance: 4: Min guard Ambulation Distance (Feet): 30 Feet Assistive device: Other (Comment) (pushing wheelchair) Ambulation/Gait Assistance Details: Overall smooth steps; Minguard for safety and cues to self-monitor for activity tolerance Gait Pattern: Step-through pattern    Exercises     PT Diagnosis: Difficulty walking;Generalized weakness  PT Problem List: Decreased strength;Decreased activity tolerance;Decreased balance;Decreased mobility;Decreased knowledge of use of DME;Decreased safety awareness PT Treatment Interventions: DME instruction;Gait training;Stair training;Functional mobility training;Therapeutic activities;Therapeutic exercise;Balance training;Patient/family education     PT Goals(Current goals can be found in the care plan section) Acute Rehab PT Goals Patient Stated Goal: wants to feel better, less clumsy and weak PT Goal Formulation: With patient Time For Goal Achievement: 01/06/14 Potential to Achieve Goals: Good  Visit Information  Last PT Received On: 12/23/13 Assistance Needed: +1 History of  Present Illness: [name] is a 57 y.o. 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  Sign interpreter was used to help translate.  Patient is a poor historian.  She reports that she has been feeling weak since the summer.  However, after Christmas she developed cough, sinus congestion and drainage.  She initially went to [facility].  She was found to be tachycardic with heart rate of 170s.  She was given diltiazem without relief.  She was given adenosine which broke her out of the rhythm to a sinus rhythm.  Chest x-ray showed right perihilar airspace disease concerning for pneumonia.  Hospitalist service was asked to admit the patient for further care and management.  Currently feels better, complaining of generalized weakness.  Felt nauseated earlier today but has not vomited.  Currently denies any chest pain.  Denies any abdominal pain or diarrhea.       Prior [place] expects to be discharged to:: Private residence Living Arrangements: Other (Comment) (to be determined) Available Help at Discharge: Other (Comment) (pt states she doesn't have help at home) Type of Home: Apartment Home Access: Stairs to enter [employer] of Steps: flights (3rd floor walk-up) Entrance Stairs-Rails: Right;Left Home Layout: One level Home Equipment: None Prior Function Level of Independence: Independent Communication Communication: Interpreter utilized;Other (comment) (sign)    Cognition  Cognition Arousal/Alertness: Awake/alert Behavior During Therapy: WFL for tasks assessed/performed;Anxious Overall Cognitive Status: Within Functional Limits for tasks assessed    Extremity/Trunk Assessment Upper Extremity Assessment Upper Extremity Assessment: Overall WFL for tasks assessed Lower Extremity Assessment Lower  Extremity Assessment: Generalized weakness   Balance    End of Session PT - End of Session Equipment Utilized During Treatment: Oxygen Activity Tolerance: Patient tolerated treatment well Patient left: in chair;with nursing/sitter in room (in wheelchair, preparing to go for renal ultrasound) Nurse Communication: Mobility status;Other (comment) (BP significantly high)  GP    Roney Marion, [place] [phone]  [place] 08/15/2013, 12:26 PM

## 2013-12-23 NOTE — ED Notes (Signed)
Attempted IV start x2 attempts w/o success - IV team notified of need for IV

## 2013-12-23 NOTE — Progress Notes (Signed)
Noted consult for inpatient diabetes coordinator due to "Deaf mute in denial re: DM dx and refusing meds in the past".  Initial lab glucose was 104 mg/dl on 12/29 @ 19:08 and fasting glucose this morning was 124 mg/dl.  A1C on 12/23/13 was 5.9% which indicates good glycemic control.  According to the home medication list, patient is not currently taking any medications for diabetes control.  However, the A1C of 5.9% is indicative of well controlled diabetes.  Since an interpreter is needed to communicate with the patient, have requested NURSING call inpatient diabetes office [phone]) in the morning to let them know when an interpreter will be scheduled to come on 12/24/13. Talked with Elmyra Ricks, RN to request she pass off request in report. Also made treatment team sticky note to reflect request.  If MD feels consult is unnecessary since the A1C was good, please discontinue diabetes coordinator consult.    Thanks, Barnie Alderman, RN, MSN, CCRN Diabetes Coordinator Inpatient Diabetes Program [phone] (Team Pager) [phone] [facility] office)

## 2013-12-23 NOTE — Progress Notes (Signed)
[facility] Progress Note [place] TEAM 1 - [facility] ICU Team    [name] [id number] DOB: 1956/07/24 DOA: 12/22/2013 PCP: Pcp Not In System  Brief narrative: 57 y.o.female with history of hypertension, hyperlipidemia, hearing impaired, ? Developmental disability vs psychiatric d/o, anemia, prior HgbA1c of 9.2 in 2012 and heart murmur who presented c/o SOB and racing heart. Sign language interpreter was used to help translate. Patient was a poor historian. She reported that she had been feeling weak since this past summer. However, after Christmas she developed cough, sinus congestion and drainage. She initially went to [facility] health. She was found to be tachycardic with heart rate of 170s. She was given PO diltiazem without relief. She was given adenosine and subsequently converted to sinus rhythm. Chest x-ray showed right perihilar airspace disease concerning for pneumonia.   Assessment/Plan: Active Problems:   Acute respiratory failure with hypoxia/Community acquired pneumonia -stable on 2L -Flu PCR negative -urinary strep and legionella pending -cont supportive care and empiric anbx's    Tachycardia/SVT?? -resolved and likely physiologic due to hypoxia -follow on tele    HYPERTENSION, ESSENTIAL, UNCONTROLLED -issues of noncompliance in past due to reported disbelief that she actually has this diagnosis -extensive discussion by Dr. Wynelle Cleveland re: compliance and sequelae- pt signed to interpretor understanding   Severe concentric left ventricular hypertrophy/moderate pulmonary HTN/grade 1 diastolic dysfunction -apparent new findings- no prior ECHO to compare - ?? HOCM -? of underlying amyloid so will ask Cards to see -could explain  recurring LE edema    Chronic bilateral LEG EDEMA -stable at present    ?? DM type 2 -previous HgA1c was 9.2 in 2012 -this admit 5.9 -pt admitted to significant dietary changes after was initially told she had DM including eliminating  exogenous sugar -at present has diet controlled DM and no indication to begin medication     CKD stage 4 -renal US shows mild medical renal dz and no old Scr to compare -if concern re: amyloid (see above) may need bx- consider renal consult -could be acute due to [facility] from PNA -if hydrate would do so carefully    HYPERLIPIDEMIA    ?? PSYCHIATRIC DISORDER/DEAF  -previously dx'd with psychotic disorder NOS in 2011 -not on meds pre admit -seems to have a degree of developmental disability and combined with her hearing loss she certainly could develop acute psychotic sx's -for best interaction with this patient use of the sign language inter[retor is best   DVT prophylaxis: Subcutaneous heparin Code Status: Full Family Communication: No family at bedside  Disposition Plan/Expected LOS: Transfer to telemetry Isolation: Droplet isolation discontinued in light of negative flu PCR Nutritional Status: Stable  Consultants: Cardiology  Procedures: 2-D echocardiogram - Left ventricle: Small LV cavity. There was severe concentric hypertrophy (septum and posterior walls measure 1.8-1.9 cm respectively). There is a prominent subendocardial "stripe" - this could suggest infiltrate process such as amyloidosis (consider cardiac MRI if clinically indicated). LV apical false tendon. Systolic function was vigorous. The estimated ejection fraction was in the range of 65% to 70%. Doppler parameters are consistent with abnormal left ventricular relaxation (grade 1 diastolic dysfunction). The E/e' ratio is >20, suggesting markedly elevated LV filling pressure. - Aortic valve: Sclerosis without stenosis. No regurgitation. - Aorta: The aortic root was mildly calcified. - Mitral valve: Moderate posterior MAC. Mild regurgitation. - Left atrium: The atrium was mildly dilated (area 21 cm2). - Atrial septum: There was increased thickness of the septum, consistent with lipomatous hypertrophy. -  Tricuspid  valve: Poorly visualized. Mild regurgitation. - Pulmonary arteries: PA peak pressure: 58mm Hg (S). - Systemic veins: IVC is poorly visualized. - Pericardium, extracardiac: There was no pericardial effusion.   Antibiotics: Zithromax 12/29 >>> Rocephin 12/29 >>>  HPI/Subjective: Patient alert and communicating through an interpreter. Denies cough. Denies shortness of breath. States in that she does not have diabetes and clarifies that she does not eat sugar anymore.  Objective: Blood pressure 158/89, pulse 75, temperature 98.6 F (37 C), temperature source Axillary, resp. rate 18, height 5\' 4"  (1.626 m), weight 221 lb 12.5 oz (100.6 kg), SpO2 100.00%.  Intake/Output Summary (Last 24 hours) at 12/23/13 1326 Last data filed at 12/23/13 1200  Gross per 24 hour  Intake   1000 ml  Output    600 ml  Net    400 ml     Exam: General: No acute respiratory distress Lungs: Clear to auscultation bilaterally without wheezes or crackles, 2L Cardiovascular: Regular rate and rhythm without murmur gallop or rub normal S1 and S2, no peripheral edema or JVD Abdomen: Nontender, nondistended, soft, bowel sounds positive, no rebound, no ascites, no appreciable mass Musculoskeletal: No significant cyanosis, clubbing of bilateral lower extremities Neurological: Alert and oriented x 3, due to hearing loss she requires an interpreter for communication, no overt focal neurological deficits appreciated  Scheduled Meds:  Scheduled Meds: . amLODipine  10 mg Oral Daily  . aspirin  325 mg Oral Daily  . azithromycin  500 mg Oral Q24H  . cefTRIAXone (ROCEPHIN)  IV  1 g Intravenous Q24H  . guaiFENesin  600 mg Oral BID  . heparin  5,000 Units Subcutaneous Q8H  . insulin aspart  0-5 Units Subcutaneous QHS  . insulin aspart  0-9 Units Subcutaneous TID WC  . levalbuterol  0.63 mg Nebulization Q8H  . metoprolol tartrate  25 mg Oral BID  . sodium chloride  3 mL Intravenous Q12H   Continuous  Infusions: . sodium chloride 999 mL (12/23/13 0700)    **Reviewed in detail by the Attending Physician  Data Reviewed: Basic Metabolic Panel:  Recent Labs Lab 12/22/13 1908 12/23/13 0425  NA 142 138  K 3.7 3.8  CL 110 105  CO2 20 19  GLUCOSE 104* 127*  BUN 55* 59*  CREATININE 3.38* 3.47*  CALCIUM 7.7* 7.5*   Liver Function Tests: No results found for this basename: AST, ALT, ALKPHOS, BILITOT, PROT, ALBUMIN,  in the last 168 hours No results found for this basename: LIPASE, AMYLASE,  in the last 168 hours No results found for this basename: AMMONIA,  in the last 168 hours CBC:  Recent Labs Lab 12/22/13 1908 12/23/13 0425  WBC 5.3 5.9  NEUTROABS 3.3  --   HGB 10.8* 9.8*  HCT 32.2* 29.2*  MCV 90.2 89.6  PLT 324 316   Cardiac Enzymes:  Recent Labs Lab 12/22/13 2026 12/23/13 0425 12/23/13 0955  TROPONINI <0.30 <0.30 <0.30   BNP (last 3 results) No results found for this basename: PROBNP,  in the last 8760 hours CBG:  Recent Labs Lab 12/23/13 0753 12/23/13 1145  GLUCAP 124* 204*    Recent Results (from the past 240 hour(s))  MRSA PCR SCREENING     Status: None   Collection Time    12/23/13  2:24 AM      Result Value Range Status   MRSA by PCR NEGATIVE  NEGATIVE Final   Comment:            The GeneXpert MRSA Assay (FDA  approved for NASAL specimens     only), is one component of a     comprehensive MRSA colonization     surveillance program. It is not     intended to diagnose MRSA     infection nor to guide or     monitor treatment for     MRSA infections.     Studies:  Recent x-ray studies have been reviewed in detail by the Attending Physician     Erin Hearing, ANP [facility] Office  [phone] Pager [phone]  **If unable to reach the above provider after paging please contact the [place] @ [phone]  On-Call/Text Page:      [email or web address]      password [email or web address]  If 7PM-7AM, please contact  night-coverage [email or web address] Password [email or web address] 09/21/2013, 1:26 PM   LOS: 1 day   I have examined the patient, reviewed the chart and modified the above note which I agree with.   Meriam Chojnowski,MD [id number] 09/21/2013, 6:54 PM

## 2013-12-24 ENCOUNTER — Encounter (HOSPITAL_COMMUNITY): Payer: Self-pay | Admitting: Physician Assistant

## 2013-12-24 DIAGNOSIS — I472 Ventricular tachycardia: Secondary | ICD-10-CM | POA: Diagnosis not present

## 2013-12-24 DIAGNOSIS — I471 Supraventricular tachycardia: Secondary | ICD-10-CM

## 2013-12-24 DIAGNOSIS — R609 Edema, unspecified: Secondary | ICD-10-CM

## 2013-12-24 DIAGNOSIS — N179 Acute kidney failure, unspecified: Secondary | ICD-10-CM | POA: Diagnosis present

## 2013-12-24 DIAGNOSIS — I5032 Chronic diastolic (congestive) heart failure: Secondary | ICD-10-CM | POA: Diagnosis present

## 2013-12-24 DIAGNOSIS — R931 Abnormal findings on diagnostic imaging of heart and coronary circulation: Secondary | ICD-10-CM | POA: Diagnosis present

## 2013-12-24 DIAGNOSIS — I498 Other specified cardiac arrhythmias: Secondary | ICD-10-CM

## 2013-12-24 DIAGNOSIS — K5909 Other constipation: Secondary | ICD-10-CM | POA: Diagnosis present

## 2013-12-24 DIAGNOSIS — D649 Anemia, unspecified: Secondary | ICD-10-CM | POA: Diagnosis present

## 2013-12-24 DIAGNOSIS — J9600 Acute respiratory failure, unspecified whether with hypoxia or hypercapnia: Secondary | ICD-10-CM

## 2013-12-24 DIAGNOSIS — R079 Chest pain, unspecified: Secondary | ICD-10-CM

## 2013-12-24 LAB — CBC
HCT: 28.7 % — ABNORMAL LOW (ref 36.0–46.0)
Hemoglobin: 9.6 g/dL — ABNORMAL LOW (ref 12.0–15.0)
MCH: 29.9 pg (ref 26.0–34.0)
MCHC: 33.4 g/dL (ref 30.0–36.0)
MCV: 89.4 fL (ref 78.0–100.0)
Platelets: 329 10*3/uL (ref 150–400)
RBC: 3.21 MIL/uL — ABNORMAL LOW (ref 3.87–5.11)
RDW: 12.9 % (ref 11.5–15.5)
WBC: 6.1 10*3/uL (ref 4.0–10.5)

## 2013-12-24 LAB — BASIC METABOLIC PANEL
BUN: 63 mg/dL — ABNORMAL HIGH (ref 6–23)
CO2: 19 mEq/L (ref 19–32)
Calcium: 7.5 mg/dL — ABNORMAL LOW (ref 8.4–10.5)
Chloride: 107 mEq/L (ref 96–112)
Creatinine, Ser: 3.99 mg/dL — ABNORMAL HIGH (ref 0.50–1.10)
GFR calc Af Amer: 13 mL/min — ABNORMAL LOW (ref >90)
GFR calc non Af Amer: 12 mL/min — ABNORMAL LOW (ref >90)
Glucose, Bld: 129 mg/dL — ABNORMAL HIGH (ref 70–99)
Potassium: 3.9 mEq/L (ref 3.7–5.3)
Sodium: 140 mEq/L (ref 137–147)

## 2013-12-24 LAB — URINE CULTURE
Colony Count: NO GROWTH
Culture: NO GROWTH

## 2013-12-24 LAB — MAGNESIUM: Magnesium: 2 mg/dL (ref 1.5–2.5)

## 2013-12-24 LAB — GLUCOSE, CAPILLARY
Glucose-Capillary: 126 mg/dL — ABNORMAL HIGH (ref 70–99)
Glucose-Capillary: 156 mg/dL — ABNORMAL HIGH (ref 70–99)
Glucose-Capillary: 110 mg/dL — ABNORMAL HIGH (ref 70–99)
Glucose-Capillary: 164 mg/dL — ABNORMAL HIGH (ref 70–99)

## 2013-12-24 LAB — T4, FREE: Free T4: 1.2 ng/dL (ref 0.80–1.80)

## 2013-12-24 LAB — HEPATIC FUNCTION PANEL
ALT: 22 U/L (ref 0–35)
AST: 25 U/L (ref 0–37)
Albumin: 1.5 g/dL — ABNORMAL LOW (ref 3.5–5.2)
Alkaline Phosphatase: 99 U/L (ref 39–117)
Bilirubin, Direct: <0.2 mg/dL (ref 0.0–0.3)
Total Bilirubin: <0.2 mg/dL — ABNORMAL LOW (ref 0.3–1.2)
Total Protein: 5.8 g/dL — ABNORMAL LOW (ref 6.0–8.3)

## 2013-12-24 LAB — STREP PNEUMONIAE URINARY ANTIGEN: Strep Pneumo Urinary Antigen: NEGATIVE

## 2013-12-24 LAB — TSH: TSH: 3.43 u[IU]/mL (ref 0.350–4.500)

## 2013-12-24 MED ORDER — NAPHAZOLINE-PHENIRAMINE 0.025-0.3 % OP SOLN
1.0000 [drp] | Freq: Four times a day (QID) | OPHTHALMIC | Status: DC | PRN
  Administered 2013-12-25: 1 [drp] via OPHTHALMIC
  Filled 2013-12-24: qty 15

## 2013-12-24 MED ORDER — BISACODYL 10 MG RE SUPP
10.0000 mg | Freq: Every day | RECTAL | Status: DC | PRN
  Administered 2013-12-24: 10 mg via RECTAL
  Filled 2013-12-24: qty 1

## 2013-12-24 MED ORDER — NAPHAZOLINE HCL 0.1 % OP SOLN
1.0000 [drp] | Freq: Four times a day (QID) | OPHTHALMIC | Status: DC | PRN
  Filled 2013-12-24: qty 15

## 2013-12-24 NOTE — Consult Note (Signed)
Cardiology Consultation Note  Patient ID: [name], MRN: [id number], DOB/AGE: [birth year] 57 y.o. Admit date: 12/22/2013   Date of Consult: 12/24/2013 Primary Physician: Pcp Not In System Primary Cardiologist: New to [facility], being seen by Dr. Percival Spanish  Chief Complaint: cough, weakness Reason for Consult: concern for amyloid on echocardiogram, arrhythmias  HPI: [name] is a 57 y/o deaf F with history of HTN, medication refusal, HL, DM (prior A1C 9.2, recent 5.9), psychiatric disorder vs developmental disorder, chronic edema, anemia previous related to fibroids who presented to [facility] with cough, congestion, SVT, acute renal failure and abnormal echo. She is not sure if she has any history of heart problems. History was obtained using a sign language interpreter but was still challenging as the patient perseverates on her constipation, a desire to eat healthier, and the diagnoses she's already been told she has since being admitted. She indicates she has not felt well for about a week with general weakness, malaise and cough. No fever, nausea, vomiting, syncope, chest pain, or orthopnea. She has had some SOB over the last week since her cough developed. She felt something strange in her chest so initially went to [facility] health and was found to be in SVT. Diltiazem did not work so she was given adenosine which reportedly broke her rhythm to NSR. She was found to have possible PNA by CXR thus was transferred to [facility] for hospitalist admission. She was also markedly hypertensive. O2 sat documented to 88% and was placed on supp O2. Admitting Cr 3.47, previously normal in 06/2012. Istat trop 0.09 (ref range less than 0.08) but next full sets x 3 negative. UA with moderate Hgb, significant proteinuria >300 protein. Renal US - possible medical renal disease, nonspecific perisplenic fluid. She reports urinating normally at home but as above, says she is continually constipated and believes  this to be the cause of all of her problems including bloating, LEE, and progressive weight gain. She believes her LEE to be stable. 2D echo was abnormal with severe concentric LVH, EF 65-70%, prominent subendocardial "stripe" possibly indicative of amyloid, mod posterior MAC with mild MR, mild TR, PA pressure 28mmHg.   While on tele also had a brief recurrence of tachycardia that appeared irregular but at times P waves are able to be seen at times. This spontaneously resolved. She also 6 beats NSVT as well. It is not clear that she was symptomatic with any of this. She denies any palpitations outside the hospital. She lost her house and now lives in an apartment with her husband. They are not on good terms as they are divorced. However, he has cancer and she does help take care of him. She has a daughter but she is not involved in her care. Per notes, she was supposed to be taking lisinopril, metformin and Zocor but does not like taking medicine and has not taken it in months. Med noncompliance is described in prior notes as well.  Past Medical History  Diagnosis Date  . Hyperlipidemia   . Hypertension     a. Has previously refused blood pressure meds.   . Deaf     Divorced from husband but lives with him. Has daughter but she does not care for her.  . Bilateral leg edema     a. Chronic.  Marland Kitchen Psychiatric disorder     She frequently exhibits paranoia and has been diagnosed with psychotic d/o NOS during hospital stay in the past. She is tangetial and perseverative  during her visits. She apparently has had a bad experience with mental health in [place] in the past and refuses to discuss mental issues for fear that she will be sent back there. Her paranoia, communication issues, financial woes and lack of fam  . Fibroid uterus   . Anemia     Due to fibroids.  . Heart murmur   . Diabetes mellitus     a. Per PCP note 2012 (A1C 9.2) - pt unwilling to take meds and was educated on risk of uncontrolled DM.  b.  A1C 5.9 in 11/2013.      Most Recent Cardiac Studies: 2D Echo 12/23/13 - Left ventricle: Small LV cavity. There was severe concentric hypertrophy (septum and posterior walls measure 1.8-1.9 cm respectively). There is a prominent subendocardial "stripe" - this could suggest infiltrate process such as amyloidosis (consider cardiac MRI if clinically indicated). LV apical false tendon. Systolic function was vigorous. The estimated ejection fraction was in the range of 65% to 70%. Doppler parameters are consistent with abnormal left ventricular relaxation (grade 1 diastolic dysfunction). The E/e' ratio is >20, suggesting markedly elevated LV filling pressure. - Aortic valve: Sclerosis without stenosis. No regurgitation. - Aorta: The aortic root was mildly calcified. - Mitral valve: Moderate posterior MAC. Mild regurgitation.  - Left atrium: The atrium was mildly dilated (area 21 cm2). - Atrial septum: There was increased thickness of the septum, consistent with lipomatous hypertrophy. - Tricuspid valve: Poorly visualized. Mild regurgitation. - Pulmonary arteries: PA peak pressure: 78mm Hg (S). - Systemic veins: IVC is poorly visualized. - Pericardium, extracardiac: There was no pericardial effusion.   Surgical History:  Past Surgical History  Procedure Laterality Date  . No past surgeries       Home Meds: Prior to Admission medications   Not on File    Inpatient Medications:  . amLODipine  10 mg Oral Daily  . aspirin  325 mg Oral Daily  . azithromycin  500 mg Oral Q24H  . cefTRIAXone (ROCEPHIN)  IV  1 g Intravenous Q24H  . guaiFENesin  600 mg Oral BID  . heparin  5,000 Units Subcutaneous Q8H  . insulin aspart  0-5 Units Subcutaneous QHS  . insulin aspart  0-9 Units Subcutaneous TID WC  . levalbuterol  0.63 mg Nebulization Q8H  . metoprolol tartrate  25 mg Oral BID  . sodium chloride  3 mL Intravenous Q12H      Allergies: No Known Allergies  History   Social History  .  Marital Status: Married    Spouse Name: N/A    Number of Children: N/A  . Years of Education: N/A   Occupational History  . Not on file.   Social History Main Topics  . Smoking status: Never Smoker   . Smokeless tobacco: Not on file     Comment: Prior dip  . Alcohol Use: No  . Drug Use: No  . Sexual Activity: Not on file   Other Topics Concern  . Not on file   Social History Narrative  . No narrative on file     Family History  Problem Relation Age of Onset  . Other Father     Drowned from fishing?  . Heart disease       Review of Systems: General: negative for chills, fever Cardiovascular: negative for chest pain, orthopnea, palpitations, paroxysmal nocturnal dyspnea Respiratory: +cough/wheezing Urologic: negative for hematuria Abdominal: negative for nausea, vomiting, diarrhea, bright red blood per rectum, melena, or hematemesis Neurologic: negative for syncope,  or dizziness. occ headache All other systems reviewed and are otherwise negative except as noted above.  Labs:  Recent Labs  12/22/13 2026 12/23/13 0425 12/23/13 0955  TROPONINI <0.30 <0.30 <0.30   Lab Results  Component Value Date   WBC 6.1 12/24/2013   HGB 9.6* 12/24/2013   HCT 28.7* 12/24/2013   MCV 89.4 12/24/2013   PLT 329 12/24/2013     Recent Labs Lab 12/24/13 0545  NA 140  K 3.9  CL 107  CO2 19  BUN 63*  CREATININE 3.99*  CALCIUM 7.5*  GLUCOSE 129*   Radiology/Studies:  Dg Chest 2 View  12/22/2013   CLINICAL DATA:  Irregular heartbeat, elevated blood pressure  EXAM: CHEST  2 VIEW  COMPARISON:  02/02/2007  FINDINGS: There is right perihilar airspace disease concerning for pneumonia. There is no other focal parenchymal opacity, pleural effusion, or pneumothorax. The heart and mediastinal contours are unremarkable.  The osseous structures are unremarkable.  IMPRESSION: Right perihilar airspace disease concerning for pneumonia. Recommend followup radiography in 4-6 weeks, to  document complete resolution following adequate medical therapy. If there is not complete resolution, then recommend further evaluation with CT of the chest to exclude underlying pathology.   Electronically Signed   By: Kathreen Devoid   On: 12/22/2013 18:25   US Renal  01/23/2013   CLINICAL DATA:  Acute renal failure  EXAM: RENAL/URINARY TRACT ULTRASOUND COMPLETE  COMPARISON:  None.  FINDINGS: Right Kidney:  Length: 5.2 cm. There is increased renal cortical echogenicity. There is a 1.6 x 1.8 x 2 cm anechoic right renal mass most consistent with a cyst.  Left Kidney:  Length: 10.9 cm. There is increased renal cortical echogenicity. No mass or hydronephrosis visualized.  Bladder:  Appears normal for degree of bladder distention.  Incidental note are made of splenic punctate hyperechoic foci likely reflecting calcifications from prior granulomatous disease. There is a small amount of perisplenic fluid.  IMPRESSION: 1. No obstructive uropathy.  2. Increased bilateral renal cortical echogenicity as can be seen with medical renal disease.  3.  Small amount of nonspecific perisplenic fluid.   Electronically Signed   By: Kathreen Devoid   On: 01/23/2013 12:30   EKG:  [facility]ia 12/29: Ogren RP tachycardia 172bpm, consider reentrant mechanism, nonspecific ST-T changes F/u ECG 12/29: NSR 98bpm borderline short PR interval,    Physical Exam: Blood pressure 156/72, pulse 82, temperature 97.7 F (36.5 C), temperature source Oral, resp. rate 21, height 5\' 4"  (1.626 m), weight 226 lb 9.6 oz (102.785 kg), SpO2 100.00%. General: Well developed AAM in no acute distress. Head: Normocephalic, atraumatic, sclera non-icteric, no xanthomas, nares are without discharge.  Neck: Negative for carotid bruits. JVD elevated to jaw.  Lungs: Diffuse wheezing, diminished BS, slight crackles at bases. Breathing is unlabored. Heart: RRR with S1 S2. No murmurs, rubs, or gallops appreciated. Abdomen: Soft, non-tender, non-distended with  normoactive bowel sounds. No hepatomegaly. No rebound/guarding. No obvious abdominal masses. Msk:  Strength and tone appear normal for age. Extremities: No clubbing or cyanosis. 2+ LEE edema.  Distal pedal pulses are 2+ and equal bilaterally. Neuro: Alert and oriented X 3. No facial asymmetry. Moves all extremities spontaneously. Psych:  Responds to questions with appropriate but frequently tangential answers. Perseverates on other issues not being directly asked to her.   Assessment and Plan:   1. Acute respiratory failure likely due to CAP 2. Acute renal failure with proteinuria 3. Hypertensive urgency, uncontrolled, likely due to medication noncompliance 4. Abnormal echocardiogram with  concern for possible amyloid 5. SVT 6. NSVT 7. Chronic LEE, likely chronic diastolic CHF 8. Chronic anemia 9. Constipation 10. Diet controlled DM A1C 5.9 11. Psychiatric disorder and/or developmental disorder  We agree with initiation of metoprolol and amlodipine. Watch BP. Would not diurese at this time due to ARF. Consider renal consult - this could be hypertensive nephropathy, but if the patient does have amyloid, we wonder if there could be nephrotic component. Will send for LFTs to assess protein status. Arrrhythmias may be due to underlying illness or aforementioned cardiomyopathy. Check TSH and magnesium. Regarding cardiomyopathy, will check SPEP and UPEP. See below regarding discussion of outpatient workup for amyloidosis to include possible fat pad biopsy and/or cardiac MRI. At this time she is agreeable to taking medicines, but it remains to be seen if she will be compliant in the outpatient setting. Her cognitive insight appears limited. Will defer rx of constipation to IM.  Signed, Dayna Dunn PA-C 12/24/2013, 10:53 AM   History and all data above reviewed.  Patient examined.  I agree with the findings as above.  The patient had SVT but denies feeling any palpitations.  She denies chest pain.   She has had chronic lower extremity edema and complaints of abdominal fullness.  She denies any PND or orthopnea.  The patient exam reveals COR:RRR  ,  Lungs: Clear  ,  Abd: Mildly distended., Ext Mild/mod edema  .  All available labs, radiology testing, previous records reviewed. Agree with documented assessment and plan. The patient had SVT.  We can manage this with beta blockers for now.  She is not particularly symptomatic with this.  She has an echo which suggests possible amyloid.  We will start with immunofixation, SPEP and UPEP.  Further consideration will be given to work up with MRI and fat pad biopsy.  I agree with needed BP control.   Hephzibah Strehle  1:10 PM  12/24/2013

## 2013-12-24 NOTE — Progress Notes (Signed)
Physical Therapy Treatment Patient Details Name: [name] MRN: [id number] DOB: Aug 22, 1956 Today's Date: 12/24/2013 Time: [id number] PT Time Calculation (min): 24 min  PT Assessment / Plan / Recommendation  History of Present Illness [name] is a 57 y.o. 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  Sign interpreter was used to help translate.  Patient is a poor historian.  She reports that she has been feeling weak since the summer.  However, after Christmas she developed cough, sinus congestion and drainage.  She initially went to [facility].  She was found to be tachycardic with heart rate of 170s.  She was given diltiazem without relief.  She was given adenosine which broke her out of the rhythm to a sinus rhythm.  Chest x-ray showed right perihilar airspace disease concerning for pneumonia.  Hospitalist service was asked to admit the patient for further care and management.  Currently feels better, complaining of generalized weakness.  Felt nauseated earlier today but has not vomited.  Currently denies any chest pain.  Denies any abdominal pain or diarrhea.   PT Comments   Dyspneic, but able to maintain sats on oxygen sufficiently to ambulate much longer than on eval.  Expect pt will make steady progress and be able to get home  With limited assist   Follow Up Recommendations  Home health PT;Supervision - Intermittent     Does the patient have the potential to tolerate intense rehabilitation     Barriers to Discharge        Equipment Recommendations  3in1 (PT);Other (comment)    Recommendations for Other Services    Frequency Min 3X/week   Progress towards PT Goals Progress towards PT goals: Progressing toward goals  Plan Current plan remains appropriate    Precautions / Restrictions  Precautions Precautions: Fall   Pertinent Vitals/Pain Vitals maintained in 90's on 2L     Mobility  Bed Mobility Bed Mobility: Not assessed Transfers Transfers: Sit to Stand;Stand to Sit Sit to Stand: 4: Min assist;From chair/3-in-1;With upper extremity assist Stand to Sit: 4: Min assist;To chair/3-in-1;With upper extremity assist Details for Transfer Assistance: assist to come forward and lifting assist Ambulation/Gait Ambulation/Gait Assistance: 4: Min guard Ambulation Distance (Feet): 240 Feet (with 3 standing rest breaks) Assistive device: Rolling walker Ambulation/Gait Assistance Details: gait generally steady, but noticeable fatigue as she progressed Gait Pattern: Step-through pattern;Wide base of support Stairs: No Wheelchair Mobility Wheelchair Mobility: No    Exercises     PT Diagnosis:    PT Problem List:   PT Treatment Interventions:     PT Goals (current goals can now be found in the care plan section) Acute Rehab PT Goals Patient Stated Goal: wants to feel better, less clumsy and weak PT Goal Formulation: With patient Time For Goal Achievement: 01/06/14 Potential to Achieve Goals: Good  Visit Information  Last PT Received On: 12/24/13 Assistance Needed: +1 History of Present Illness: [name] is a 57 y.o. 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  Sign interpreter was used to help translate.  Patient is a poor historian.  She reports that she has been feeling weak since the summer.  However, after Christmas she developed cough, sinus congestion and drainage.  She initially went to [facility].  She was found to be tachycardic with heart rate of 170s.  She was given diltiazem without relief.  She was given adenosine which broke her out of the rhythm to a sinus rhythm.  Chest x-ray  showed right perihilar airspace disease concerning for pneumonia.  Hospitalist service was asked to admit the patient for further care and management.  Currently feels better, complaining of generalized weakness.  Felt nauseated earlier today but has not vomited.  Currently denies any chest pain.  Denies any abdominal pain or diarrhea.    Subjective Data  Subjective: pt signing being really tired. Patient Stated Goal: wants to feel better, less clumsy and weak   Cognition  Cognition Arousal/Alertness: Awake/alert Behavior During Therapy: WFL for tasks assessed/performed;Anxious Overall Cognitive Status: Within Functional Limits for tasks assessed    Balance  Balance Balance Assessed: Yes Static Standing Balance Static Standing - Balance Support: Bilateral upper extremity supported Static Standing - Level of Assistance: 5: Stand by assistance  End of Session PT - End of Session Equipment Utilized During Treatment: Oxygen Activity Tolerance: Patient tolerated treatment well Patient left: in chair;with call bell/phone within reach Nurse Communication: Mobility status;Other (comment)   GP     Shannan Slinker, Tessie Fass 12/24/2013, 2:53 PM 12/24/2013  [name], [place] [phone]  (pager)

## 2013-12-24 NOTE — Progress Notes (Signed)
[facility] PROGRESS NOTE  [name] [id number] DOB: 06/17/1956 DOA: 12/22/2013 PCP: Pcp Not In System  Assessment/Plan: Acute respiratory failure with hypoxia/Community acquired pneumonia  -Flu PCR negative  -cont supportive care and empiric anbx's  Tachycardia/SVT??  -resolved and likely physiologic due to hypoxia  -follow on tele  HYPERTENSION, ESSENTIAL, UNCONTROLLED  -issues of noncompliance in past due to reported disbelief that she actually has this diagnosis  Severe concentric left ventricular hypertrophy/moderate pulmonary HTN/grade 1 diastolic dysfunction  -apparent new findings- no prior ECHO to compare - ?? HOCM  -? of underlying amyloid so Cards consulted - appreciate recs  Chronic bilateral LEG EDEMA  -stable at present  ?? DM type 2  -previous HgA1c was 9.2 in 2012  -this admit 5.9  -pt admitted to significant dietary changes after was initially told she had DM including eliminating exogenous sugar  -at present has diet controlled DM and no indication to begin medication  CKD stage 4  -renal US shows mild medical renal dz and no old Scr to compare  -if concern re: amyloid (see above) may need bx -Will consult Nephrology  -Not improving with hydration HYPERLIPIDEMIA  ?? PSYCHIATRIC DISORDER/DEAF  -previously dx'd with psychotic disorder NOS in 2011  -not on meds pre admit  -seems to have a degree of developmental disability and combined with her hearing loss she certainly could develop acute psychotic sx's  -for best interaction with this patient use of the sign language inter[retor is best -Question of decision making capacity. Will consult Psychiatry for input -Pt's daughter reports a hx of prior schizophrenia and is concerned that pt is currently not being treated for this (had previously been treated by psych)  Code Status: Full Family Communication: Pt and daughter in room (indicate person spoken with, relationship, and if by phone, the  number) Disposition Plan: Pending   Consultants: Cardiology Nephrology  Antibiotics: Zithromax 12/29 >>>  Rocephin 12/29 >>>  HPI/Subjective: No acute events reported. Pt eager to go home.  Objective: Filed Vitals:   12/23/13 2217 12/24/13 0333 12/24/13 0419 12/24/13 1511  BP: 180/72 175/77 156/72 164/75  Pulse: 88 80 82 81  Temp:   97.7 F (36.5 C) 97.4 F (36.3 C)  TempSrc:   Oral Oral  Resp:   21 18  Height:      Weight:   102.785 kg (226 lb 9.6 oz)   SpO2:   100% 100%    Intake/Output Summary (Last 24 hours) at 12/24/13 1731 Last data filed at 12/24/13 1016  Gross per 24 hour  Intake    770 ml  Output    500 ml  Net    270 ml   Filed Weights   12/22/13 1647 12/23/13 0429 12/24/13 0419  Weight: 90.719 kg (200 lb) 100.6 kg (221 lb 12.5 oz) 102.785 kg (226 lb 9.6 oz)    Exam:   General:  Awake, in nad  Cardiovascular: Regular, s1, s2  Respiratory: normal resp effort, no wheezing  Abdomen: soft,nondistended  Musculoskeletal: perfused, no clubbing   Data Reviewed: Basic Metabolic Panel:  Recent Labs Lab 12/22/13 1908 12/23/13 0425 12/24/13 0545 12/24/13 1315  NA 142 138 140  --   K 3.7 3.8 3.9  --   CL 110 105 107  --   CO2 20 19 19   --   GLUCOSE 104* 127* 129*  --   BUN 55* 59* 63*  --   CREATININE 3.38* 3.47* 3.99*  --   CALCIUM 7.7* 7.5* 7.5*  --  MG  --   --   --  2.0   Liver Function Tests:  Recent Labs Lab 12/24/13 1315  AST 25  ALT 22  ALKPHOS 99  BILITOT <0.2*  PROT 5.8*  ALBUMIN 1.5*   No results found for this basename: LIPASE, AMYLASE,  in the last 168 hours No results found for this basename: AMMONIA,  in the last 168 hours CBC:  Recent Labs Lab 12/22/13 1908 12/23/13 0425 12/24/13 0545  WBC 5.3 5.9 6.1  NEUTROABS 3.3  --   --   HGB 10.8* 9.8* 9.6*  HCT 32.2* 29.2* 28.7*  MCV 90.2 89.6 89.4  PLT 324 316 329   Cardiac Enzymes:  Recent Labs Lab 12/22/13 2026 12/23/13 0425 12/23/13 0955  TROPONINI  <0.30 <0.30 <0.30   BNP (last 3 results) No results found for this basename: PROBNP,  in the last 8760 hours CBG:  Recent Labs Lab 12/23/13 1615 12/23/13 1949 12/24/13 0634 12/24/13 1140 12/24/13 1638  GLUCAP 166* 193* 126* 156* 110*    Recent Results (from the past 240 hour(s))  CULTURE, BLOOD (ROUTINE X 2)     Status: None   Collection Time    12/22/13 10:28 PM      Result Value Range Status   Specimen Description BLOOD RIGHT HAND   Final   Special Requests BOTTLES DRAWN AEROBIC ONLY 3CC   Final   Culture  Setup Time     Final   Value: 05/06/202014 04:36     Performed at [facility]   Culture     Final   Value:        BLOOD CULTURE RECEIVED NO GROWTH TO DATE CULTURE WILL BE HELD FOR 5 DAYS BEFORE ISSUING A FINAL NEGATIVE REPORT     Performed at [facility]   Report Status PENDING   Incomplete  CULTURE, BLOOD (ROUTINE X 2)     Status: None   Collection Time    12/22/13 10:40 PM      Result Value Range Status   Specimen Description BLOOD LEFT HAND   Final   Special Requests BOTTLES DRAWN AEROBIC ONLY [facility]   Final   Culture  Setup Time     Final   Value: 05/06/202014 04:36     Performed at [facility]   Culture     Final   Value:        BLOOD CULTURE RECEIVED NO GROWTH TO DATE CULTURE WILL BE HELD FOR 5 DAYS BEFORE ISSUING A FINAL NEGATIVE REPORT     Performed at [facility]   Report Status PENDING   Incomplete  MRSA PCR SCREENING     Status: None   Collection Time    12/23/13  2:24 AM      Result Value Range Status   MRSA by PCR NEGATIVE  NEGATIVE Final   Comment:            The GeneXpert MRSA Assay (FDA     approved for NASAL specimens     only), is one component of a     comprehensive MRSA colonization     surveillance program. It is not     intended to diagnose MRSA     infection nor to guide or     monitor treatment for     MRSA infections.  CULTURE, BLOOD (ROUTINE X 2)     Status: None   Collection Time    12/23/13  4:25  AM  Result Value Range Status   Specimen Description BLOOD RIGHT ANTECUBITAL   Final   Special Requests BOTTLES DRAWN AEROBIC ONLY 5CC   Final   Culture  Setup Time     Final   Value: May 14, 202014 08:58     Performed at [facility]   Culture     Final   Value:        BLOOD CULTURE RECEIVED NO GROWTH TO DATE CULTURE WILL BE HELD FOR 5 DAYS BEFORE ISSUING A FINAL NEGATIVE REPORT     Performed at [facility]   Report Status PENDING   Incomplete  CULTURE, BLOOD (ROUTINE X 2)     Status: None   Collection Time    12/23/13  4:30 AM      Result Value Range Status   Specimen Description BLOOD RIGHT HAND   Final   Special Requests BOTTLES DRAWN AEROBIC ONLY 3CC   Final   Culture  Setup Time     Final   Value: May 14, 202014 08:58     Performed at [facility]   Culture     Final   Value:        BLOOD CULTURE RECEIVED NO GROWTH TO DATE CULTURE WILL BE HELD FOR 5 DAYS BEFORE ISSUING A FINAL NEGATIVE REPORT     Performed at [facility]   Report Status PENDING   Incomplete     Studies: Dg Chest 2 View  12/22/2013   CLINICAL DATA:  Irregular heartbeat, elevated blood pressure  EXAM: CHEST  2 VIEW  COMPARISON:  02/02/2007  FINDINGS: There is right perihilar airspace disease concerning for pneumonia. There is no other focal parenchymal opacity, pleural effusion, or pneumothorax. The heart and mediastinal contours are unremarkable.  The osseous structures are unremarkable.  IMPRESSION: Right perihilar airspace disease concerning for pneumonia. Recommend followup radiography in 4-6 weeks, to document complete resolution following adequate medical therapy. If there is not complete resolution, then recommend further evaluation with CT of the chest to exclude underlying pathology.   Electronically Signed   By: Kathreen Devoid   On: 12/22/2013 18:25   US Renal  May 14, 202014   CLINICAL DATA:  Acute renal failure  EXAM: RENAL/URINARY TRACT ULTRASOUND COMPLETE  COMPARISON:  None.   FINDINGS: Right Kidney:  Length: 5.2 cm. There is increased renal cortical echogenicity. There is a 1.6 x 1.8 x 2 cm anechoic right renal mass most consistent with a cyst.  Left Kidney:  Length: 10.9 cm. There is increased renal cortical echogenicity. No mass or hydronephrosis visualized.  Bladder:  Appears normal for degree of bladder distention.  Incidental note are made of splenic punctate hyperechoic foci likely reflecting calcifications from prior granulomatous disease. There is a small amount of perisplenic fluid.  IMPRESSION: 1. No obstructive uropathy.  2. Increased bilateral renal cortical echogenicity as can be seen with medical renal disease.  3.  Small amount of nonspecific perisplenic fluid.   Electronically Signed   By: Kathreen Devoid   On: May 14, 202014 12:30    Scheduled Meds: . amLODipine  10 mg Oral Daily  . aspirin  325 mg Oral Daily  . azithromycin  500 mg Oral Q24H  . cefTRIAXone (ROCEPHIN)  IV  1 g Intravenous Q24H  . guaiFENesin  600 mg Oral BID  . heparin  5,000 Units Subcutaneous Q8H  . insulin aspart  0-5 Units Subcutaneous QHS  . insulin aspart  0-9 Units Subcutaneous TID WC  . levalbuterol  0.63 mg Nebulization Q8H  .  metoprolol tartrate  25 mg Oral BID  . sodium chloride  3 mL Intravenous Q12H   Continuous Infusions:   Active Problems:   HYPERLIPIDEMIA   PSYCHIATRIC DISORDER   DEAF MUTISM   HYPERTENSION, ESSENTIAL, UNCONTROLLED   LEG EDEMA   Acute respiratory failure with hypoxia   Community acquired pneumonia   DM type 2 causing CKD stage 4   Acute renal failure   SVT (supraventricular tachycardia)   NSVT (nonsustained ventricular tachycardia)   Abnormal echocardiogram   Chronic diastolic CHF (congestive heart failure)   Anemia   Constipation  Time spent: 6min  Durrel Mcnee, [facility] Pager [phone]. If 7PM-7AM, please contact night-coverage at [email or web address], password [facility] 12/24/2013, 5:31 PM  LOS: 2 days

## 2013-12-25 DIAGNOSIS — Z008 Encounter for other general examination: Secondary | ICD-10-CM

## 2013-12-25 DIAGNOSIS — F39 Unspecified mood [affective] disorder: Secondary | ICD-10-CM

## 2013-12-25 DIAGNOSIS — J9600 Acute respiratory failure, unspecified whether with hypoxia or hypercapnia: Secondary | ICD-10-CM

## 2013-12-25 DIAGNOSIS — R079 Chest pain, unspecified: Secondary | ICD-10-CM

## 2013-12-25 DIAGNOSIS — I471 Supraventricular tachycardia: Secondary | ICD-10-CM

## 2013-12-25 LAB — CBC WITH DIFFERENTIAL/PLATELET
Basophils Absolute: 0 10*3/uL (ref 0.0–0.1)
Basophils Relative: 0 % (ref 0–1)
Eosinophils Absolute: 0.1 10*3/uL (ref 0.0–0.7)
Eosinophils Relative: 1 % (ref 0–5)
HCT: 26.3 % — ABNORMAL LOW (ref 36.0–46.0)
Hemoglobin: 8.7 g/dL — ABNORMAL LOW (ref 12.0–15.0)
Lymphocytes Relative: 45 % (ref 12–46)
Lymphs Abs: 3.4 10*3/uL (ref 0.7–4.0)
MCH: 29.9 pg (ref 26.0–34.0)
MCHC: 33.1 g/dL (ref 30.0–36.0)
MCV: 90.4 fL (ref 78.0–100.0)
Monocytes Absolute: 0.5 10*3/uL (ref 0.1–1.0)
Monocytes Relative: 6 % (ref 3–12)
Neutro Abs: 3.6 10*3/uL (ref 1.7–7.7)
Neutrophils Relative %: 48 % (ref 43–77)
Platelets: 311 10*3/uL (ref 150–400)
RBC: 2.91 MIL/uL — ABNORMAL LOW (ref 3.87–5.11)
RDW: 13.1 % (ref 11.5–15.5)
WBC: 7.6 10*3/uL (ref 4.0–10.5)

## 2013-12-25 LAB — BASIC METABOLIC PANEL
BUN: 67 mg/dL — ABNORMAL HIGH (ref 6–23)
CO2: 19 mEq/L (ref 19–32)
Calcium: 7.5 mg/dL — ABNORMAL LOW (ref 8.4–10.5)
Chloride: 108 mEq/L (ref 96–112)
Creatinine, Ser: 4.31 mg/dL — ABNORMAL HIGH (ref 0.50–1.10)
GFR calc Af Amer: 12 mL/min — ABNORMAL LOW (ref >90)
GFR calc non Af Amer: 10 mL/min — ABNORMAL LOW (ref >90)
Glucose, Bld: 111 mg/dL — ABNORMAL HIGH (ref 70–99)
Potassium: 4 mEq/L (ref 3.7–5.3)
Sodium: 141 mEq/L (ref 137–147)

## 2013-12-25 LAB — GLUCOSE, CAPILLARY
Glucose-Capillary: 108 mg/dL — ABNORMAL HIGH (ref 70–99)
Glucose-Capillary: 135 mg/dL — ABNORMAL HIGH (ref 70–99)
Glucose-Capillary: 133 mg/dL — ABNORMAL HIGH (ref 70–99)
Glucose-Capillary: 133 mg/dL — ABNORMAL HIGH (ref 70–99)

## 2013-12-25 MED ORDER — METOPROLOL TARTRATE 25 MG PO TABS
25.0000 mg | ORAL_TABLET | Freq: Four times a day (QID) | ORAL | Status: DC
  Administered 2013-12-25 – 2013-12-26 (×4): 25 mg via ORAL
  Filled 2013-12-25 (×8): qty 1

## 2013-12-25 MED ORDER — SODIUM CHLORIDE 0.9 % IV SOLN
INTRAVENOUS | Status: DC
  Administered 2013-12-25 – 2013-12-26 (×2): via INTRAVENOUS

## 2013-12-25 NOTE — Consult Note (Signed)
[name]    HPI: [name] is a 58 y.o. female who is deaf who presented with cough, and weakness, also found to have acute kidney injury (Cr on admission 3.38; was <1 as recently as July 2013) and abnormal cardiac echo. Pt has medical history as listed below; of note, pt has psychiatric vs developmental disorder with ?history of schizophrenia per other provider's notes, for which she is not being treated, and pt has been noncompliant with medications for HTN in the past. Pt's Cr has trended up since admission and kidney ultrasound suggests medical renal disease.  Pt is a very poor historian but states she has never had issues with her kidneys, before. Her main complaint is that she is "very weak" and has had difficulty with constipation, but she is otherwise vague and does not have specific complaints.  She is being treated this admission by her primary team for CAP and cardiology is also consulting for severe LVH / pulm HTN / diastolic dysfunction with concern for underlying amyloidosis.  Past Medical History  Diagnosis Date  . Hyperlipidemia   . Hypertension     a. Has previously refused blood pressure meds.   . Deaf     Divorced from husband but lives with him. Has daughter but she does not care for her.  . Bilateral leg edema     a. Chronic.  Marland Kitchen Psychiatric disorder     She frequently exhibits paranoia and has been diagnosed with psychotic d/o NOS during hospital stay in the past. She is tangetial and perseverative during her visits. She apparently has had a bad experience with mental health in [place] in the past and refuses to discuss mental issues for fear that she will be sent back there. Her paranoia, communication issues, financial woes and lack of fam  . Fibroid uterus   . Anemia     Due to fibroids.  . Heart murmur   . Diabetes mellitus     a. Per PCP note 2012 (A1C 9.2) - pt unwilling to take meds and was educated on risk of uncontrolled DM.  b. A1C 5.9 in  11/2013.   Past Surgical History  Procedure Laterality Date  . No past surgeries     Social History:  reports that she has never smoked. She does not have any smokeless tobacco history on file. She reports that she does not drink alcohol or use illicit drugs. Allergies: No Known Allergies Family History  Problem Relation Age of Onset  . Other Father     Drowned from fishing?  . Heart disease      Medications:  I have reviewed the patient's current medications. Prior to Admission:  No prescriptions prior to admission   Scheduled: . amLODipine  10 mg Oral Daily  . aspirin  325 mg Oral Daily  . azithromycin  500 mg Oral Q24H  . cefTRIAXone (ROCEPHIN)  IV  1 g Intravenous Q24H  . guaiFENesin  600 mg Oral BID  . heparin  5,000 Units Subcutaneous Q8H  . insulin aspart  0-5 Units Subcutaneous QHS  . insulin aspart  0-9 Units Subcutaneous TID WC  . levalbuterol  0.63 mg Nebulization Q8H  . metoprolol tartrate  25 mg Oral Q6H  . sodium chloride  3 mL Intravenous Q12H   Continuous: . sodium chloride     YWV:[id number], acetaminophen, bisacodyl, hydrALAZINE, naphazoline-pheniramine, ondansetron (ZOFRAN) IV, ondansetron Anti-infectives   Start     Dose/Rate Route Frequency Ordered Stop   12/23/13  2230  azithromycin (ZITHROMAX) tablet 500 mg     500 mg Oral Every 24 hours 12/23/13 0221 12/30/13 2229   12/23/13 2200  cefTRIAXone (ROCEPHIN) 1 g in dextrose 5 % 50 mL IVPB     1 g 100 mL/hr over 30 Minutes Intravenous Every 24 hours 12/23/13 0221 12/30/13 2159   12/22/13 2215  azithromycin (ZITHROMAX) 500 mg in dextrose 5 % 250 mL IVPB  Status:  Discontinued     500 mg 250 mL/hr over 60 Minutes Intravenous  Once 12/22/13 2208 12/22/13 2208   12/22/13 2200  azithromycin (ZITHROMAX) 500 mg in dextrose 5 % 250 mL IVPB     500 mg 250 mL/hr over 60 Minutes Intravenous  Once 12/22/13 2132 12/22/13 2347   12/22/13 2030  cefTRIAXone (ROCEPHIN) 1 g in dextrose 5 % 50 mL IVPB     1 g 100  mL/hr over 30 Minutes Intravenous  Once 12/22/13 2020 12/22/13 2204   12/22/13 2030  azithromycin (ZITHROMAX) 500 mg in dextrose 5 % 250 mL IVPB  Status:  Discontinued     500 mg 250 mL/hr over 60 Minutes Intravenous  Once 12/22/13 2020 12/22/13 2132     ROS: As above in HPI.  Blood pressure 184/79, pulse 82, temperature 97.9 F (36.6 C), temperature source Oral, resp. rate 20, height _0  (1.626 m), weight 229 lb 11.5 oz (104.2 kg), SpO2 100.00%.  General appearance: alert, cooperative and no distress Head: Normocephalic, without obvious abnormality, atraumatic Eyes: conjunctivae/corneas clear. PERRL, EOM's intact. Fundi benign. Ears: normal externally Nose: Nares normal. Septum midline. Mucosa normal. No drainage or sinus tenderness. Throat: lips, mucosa, and tongue normal; teeth and gums normal Resp: normal respiratory effort, no focal diminished sounds Chest wall: no tenderness Cardio: regular rate and rhythm GI: soft, non-tender; bowel sounds normal; no masses,  no organomegaly Extremities: edema bilaterally in LE to knees, 2-3+ Skin: Skin color, texture, turgor normal. No rashes or lesions Neurologic: Mental status: alert / oriented but perseverates on symptoms Results for orders placed during the hospital encounter of 12/22/13 (from the past 48 hour(s))  GLUCOSE, CAPILLARY     Status: Abnormal   Collection Time    12/23/13 11:45 AM      Result Value Range   Glucose-Capillary 204 (*) 70 - 99 mg/dL  GLUCOSE, CAPILLARY     Status: Abnormal   Collection Time    12/23/13  4:15 PM      Result Value Range   Glucose-Capillary 166 (*) 70 - 99 mg/dL  GLUCOSE, CAPILLARY     Status: Abnormal   Collection Time    12/23/13  7:49 PM      Result Value Range   Glucose-Capillary 193 (*) 70 - 99 mg/dL   Comment 1 Documented in Chart     Comment 2 Notify RN    STREP PNEUMONIAE URINARY ANTIGEN     Status: None   Collection Time    12/24/13  4:11 AM      Result Value Range   Strep  Pneumo Urinary Antigen NEGATIVE  NEGATIVE   Comment:            Infection due to S. pneumoniae     cannot be absolutely ruled out     since the antigen present     may be below the detection limit     of the test.  CBC     Status: Abnormal   Collection Time    12/24/13  5:45 AM  Result Value Range   WBC 6.1  4.0 - 10.5 K/uL   RBC 3.21 (*) 3.87 - 5.11 MIL/uL   Hemoglobin 9.6 (*) 12.0 - 15.0 g/dL   HCT 28.7 (*) 36.0 - 46.0 %   MCV 89.4  78.0 - 100.0 fL   MCH 29.9  26.0 - 34.0 pg   MCHC 33.4  30.0 - 36.0 g/dL   RDW 12.9  11.5 - 15.5 %   Platelets 329  150 - 400 K/uL  BASIC METABOLIC PANEL     Status: Abnormal   Collection Time    12/24/13  5:45 AM      Result Value Range   Sodium 140  137 - 147 mEq/L   Comment: Please note change in reference range.   Potassium 3.9  3.7 - 5.3 mEq/L   Comment: Please note change in reference range.   Chloride 107  96 - 112 mEq/L   CO2 19  19 - 32 mEq/L   Glucose, Bld 129 (*) 70 - 99 mg/dL   BUN 63 (*) 6 - 23 mg/dL   Creatinine, Ser 3.99 (*) 0.50 - 1.10 mg/dL   Calcium 7.5 (*) 8.4 - 10.5 mg/dL   GFR calc non Af Amer 12 (*) >90 mL/min   GFR calc Af Amer 13 (*) >90 mL/min   Comment: (NOTE)     The eGFR has been calculated using the CKD EPI equation.     This calculation has not been validated in all clinical situations.     eGFR's persistently <90 mL/min signify possible Chronic Kidney     Disease.  GLUCOSE, CAPILLARY     Status: Abnormal   Collection Time    12/24/13  6:34 AM      Result Value Range   Glucose-Capillary 126 (*) 70 - 99 mg/dL   Comment 1 Documented in Chart     Comment 2 Notify RN    GLUCOSE, CAPILLARY     Status: Abnormal   Collection Time    12/24/13 11:40 AM      Result Value Range   Glucose-Capillary 156 (*) 70 - 99 mg/dL  HEPATIC FUNCTION PANEL     Status: Abnormal   Collection Time    12/24/13  1:15 PM      Result Value Range   Total Protein 5.8 (*) 6.0 - 8.3 g/dL   Albumin 1.5 (*) 3.5 - 5.2 g/dL   AST 25   0 - 37 U/L   ALT 22  0 - 35 U/L   Alkaline Phosphatase 99  39 - 117 U/L   Total Bilirubin <0.2 (*) 0.3 - 1.2 mg/dL   Comment: REPEATED TO VERIFY   Bilirubin, Direct <0.2  0.0 - 0.3 mg/dL   Comment: REPEATED TO VERIFY   Indirect Bilirubin NOT CALCULATED  0.3 - 0.9 mg/dL  TSH     Status: None   Collection Time    12/24/13  1:15 PM      Result Value Range   TSH 3.430  0.350 - 4.500 uIU/mL   Comment: Performed at [facility]  T4, FREE     Status: None   Collection Time    12/24/13  1:15 PM      Result Value Range   Free T4 1.20  0.80 - 1.80 ng/dL   Comment: Performed at [facility]     Status: None   Collection Time    12/24/13  1:15 PM      Result  Value Range   Magnesium 2.0  1.5 - 2.5 mg/dL  GLUCOSE, CAPILLARY     Status: Abnormal   Collection Time    12/24/13  4:38 PM      Result Value Range   Glucose-Capillary 110 (*) 70 - 99 mg/dL  GLUCOSE, CAPILLARY     Status: Abnormal   Collection Time    12/24/13  8:43 PM      Result Value Range   Glucose-Capillary 164 (*) 70 - 99 mg/dL   Comment 1 Documented in Chart     Comment 2 Notify RN    BASIC METABOLIC PANEL     Status: Abnormal   Collection Time    12/25/13  5:53 AM      Result Value Range   Sodium 141  137 - 147 mEq/L   Comment: Please note change in reference range.   Potassium 4.0  3.7 - 5.3 mEq/L   Comment: Please note change in reference range.   Chloride 108  96 - 112 mEq/L   CO2 19  19 - 32 mEq/L   Glucose, Bld 111 (*) 70 - 99 mg/dL   BUN 67 (*) 6 - 23 mg/dL   Creatinine, Ser 4.31 (*) 0.50 - 1.10 mg/dL   Calcium 7.5 (*) 8.4 - 10.5 mg/dL   GFR calc non Af Amer 10 (*) >90 mL/min   GFR calc Af Amer 12 (*) >90 mL/min   Comment: (NOTE)     The eGFR has been calculated using the CKD EPI equation.     This calculation has not been validated in all clinical situations.     eGFR's persistently <90 mL/min signify possible Chronic Kidney     Disease.  CBC WITH DIFFERENTIAL     Status:  Abnormal   Collection Time    12/25/13  5:53 AM      Result Value Range   WBC 7.6  4.0 - 10.5 K/uL   RBC 2.91 (*) 3.87 - 5.11 MIL/uL   Hemoglobin 8.7 (*) 12.0 - 15.0 g/dL   HCT 26.3 (*) 36.0 - 46.0 %   MCV 90.4  78.0 - 100.0 fL   MCH 29.9  26.0 - 34.0 pg   MCHC 33.1  30.0 - 36.0 g/dL   RDW 13.1  11.5 - 15.5 %   Platelets 311  150 - 400 K/uL   Neutrophils Relative % 48  43 - 77 %   Lymphocytes Relative 45  12 - 46 %   Monocytes Relative 6  3 - 12 %   Eosinophils Relative 1  0 - 5 %   Basophils Relative 0  0 - 1 %   Neutro Abs 3.6  1.7 - 7.7 K/uL   Lymphs Abs 3.4  0.7 - 4.0 K/uL   Monocytes Absolute 0.5  0.1 - 1.0 K/uL   Eosinophils Absolute 0.1  0.0 - 0.7 K/uL   Basophils Absolute 0.0  0.0 - 0.1 K/uL   RBC Morphology CRENATED RBCs     Comment: POLYCHROMASIA PRESENT     ELLIPTOCYTES  GLUCOSE, CAPILLARY     Status: Abnormal   Collection Time    12/25/13  6:23 AM      Result Value Range   Glucose-Capillary 108 (*) 70 - 99 mg/dL   Comment 1 Documented in Chart     Comment 2 Notify RN     US Renal  2020/09/313   CLINICAL DATA:  Acute renal failure  EXAM: RENAL/URINARY TRACT ULTRASOUND COMPLETE  COMPARISON:  None.  FINDINGS: Right Kidney:  Length: 5.2 cm. There is increased renal cortical echogenicity. There is a 1.6 x 1.8 x 2 cm anechoic right renal mass most consistent with a cyst.  Left Kidney:  Length: 10.9 cm. There is increased renal cortical echogenicity. No mass or hydronephrosis visualized.  Bladder:  Appears normal for degree of bladder distention.  Incidental note are made of splenic punctate hyperechoic foci likely reflecting calcifications from prior granulomatous disease. There is a small amount of perisplenic fluid.  IMPRESSION: 1. No obstructive uropathy.  2. Increased bilateral renal cortical echogenicity as can be seen with medical renal disease.  3.  Small amount of nonspecific perisplenic fluid.   Electronically Signed   By: Kathreen Devoid   On: 08/18/202014 12:30     Assessment: 1. AKI - Cr trending up, likely acute on chronic, probable contribution of uncontrolled HTN / DM  - otherwise, ?pre-renal; cardiology concerned for amyloidosis but no previous diagnosis  - right kidney listed as 5.2 cm on [place] report, but appears to measure 11.1 cm on images 2. CAP - on Rocephin, azithromycin per primary team 3. HTN - poor control / non-compliance, now on Norvasc and Lopressor, with hydralazine PRN 4. DM - ?diagnosis, has refused treatment in the past but A1c 5.9 on 12/23/13 5. Hb variation - ?chronic anemia, currently likely hemodilutional, some variation 9-12 in the past, as well; no obvious bleed 6. Psychiatric issues - ?psychotic disorder vs ?developmental, psychiatry consult pending 7. Deafness - seems to communicate well with sign interpreter  Plan: - trend Cr, avoid nephrotoxic medications - daily renal panel and check urine protein/Cr ratio - continue IVF for now - trend CBC to assure Hb does not continue to drop - may need to consider renal biopsy if amyloid is a possibility; if this is necessary, ?if can be done as an outpt - continue antihypertensives per primary service/cardiology - defer treatment for CAP and assessment of psychiatric issues to primary team  Please see also pending attending cosign for additions / edits.  Emmaline Kluver, MD PGY-2, [facility] 12/25/2013, 10:48 AM

## 2013-12-25 NOTE — Progress Notes (Addendum)
[facility] PROGRESS NOTE  ARSHDEEP ARRIAZA [id number] DOB: 1956-03-07 DOA: 12/22/2013 PCP: Pcp Not In System Off service summary: 58 y.o.female with history of hypertension, hyperlipidemia, hearing impaired, ? Developmental disability vs psychiatric d/o, anemia, prior HgbA1c of 9.2 in 2012 and heart murmur who presented c/o SOB and racing heart. Sign language interpreter was used to help translate. Patient was a poor historian. She reported that she had been feeling weak since this past summer. However, after Christmas she developed cough, sinus congestion and drainage. She initially went to [facility] health. She was found to be tachycardic with heart rate of 170s. She was given PO diltiazem without relief. She was given adenosine and subsequently converted to sinus rhythm. Chest x-ray showed right perihilar airspace disease concerning for pneumonia.  2d echo obtained with findings worrisome for possible amyloid. W/u pending. Pt also has ARF that does not seem to be improved with IVF. Nephrology is consulted and are following. Pt has a remote hx of schizophrenia, currently untreated. Psych was consulted at the request of pt's daughter. Assessment/Plan: Acute respiratory failure with hypoxia/Community acquired pneumonia  -Flu PCR negative  -cont supportive care and empiric anbx's  Tachycardia/SVT??  -resolved and likely physiologic due to hypoxia  -follow on tele  HYPERTENSION, ESSENTIAL, UNCONTROLLED  -issues of noncompliance in past due to reported disbelief that she actually has this diagnosis  Severe concentric left ventricular hypertrophy/moderate pulmonary HTN/grade 1 diastolic dysfunction  -apparent new findings- no prior ECHO to compare - ?? HOCM  -? of underlying amyloid so Cards consulted - appreciate recs  Chronic bilateral LEG EDEMA  -stable at present  ?? DM type 2  -previous HgA1c was 9.2 in 2012  -this admit 5.9  -pt admitted to significant dietary changes after was  initially told she had DM including eliminating exogenous sugar  -at present has diet controlled DM and no indication to begin medication  CKD stage 4  -renal US shows mild medical renal dz and no old Scr to compare  -if concern re: amyloid (see above) may need bx -Nephrology consulted -Not improving with hydration HYPERLIPIDEMIA  ?? PSYCHIATRIC DISORDER/DEAF  -previously dx'd with psychotic disorder NOS in 2011  -not on meds pre admit  -seems to have a degree of developmental disability and combined with her hearing loss she certainly could develop acute psychotic sx's  -for best interaction with this patient use of the sign language inter[retor is best -Question of decision making capacity. Consult Psychiatry for input -Pt's daughter reports a hx of prior schizophrenia and is concerned that pt is currently not being treated for this (had previously been treated by psych)  Code Status: Full Family Communication: Pt and daughter in room (indicate person spoken with, relationship, and if by phone, the number) Disposition Plan: Pending  Consultants: Cardiology Nephrology  Antibiotics: Zithromax 12/29 >>>  Rocephin 12/29 >>>  HPI/Subjective: No acute events reported. Complains of blurry vision (in the setting of known diabetic retinopathy with planned laser treatment as an outpatient).  Objective: Filed Vitals:   12/24/13 1511 12/24/13 1959 12/24/13 2043 12/25/13 0515  BP: 164/75  160/68 184/79  Pulse: 81  85 82  Temp: 97.4 F (36.3 C)  98 F (36.7 C) 97.9 F (36.6 C)  TempSrc: Oral  Oral Oral  Resp: 18  18 20   Height:      Weight:    104.2 kg (229 lb 11.5 oz)  SpO2: 100% 97% 99% 100%    Intake/Output Summary (Last 24 hours) at 12/25/13 [phone]  Last data filed at 12/25/13 0849  Gross per 24 hour  Intake   1010 ml  Output   1476 ml  Net   -466 ml   Filed Weights   12/23/13 0429 12/24/13 0419 12/25/13 0515  Weight: 100.6 kg (221 lb 12.5 oz) 102.785 kg (226 lb 9.6 oz)  104.2 kg (229 lb 11.5 oz)    Exam:   General:  Awake, in nad  Cardiovascular: Regular, s1, s2  Respiratory: normal resp effort, no wheezing  Abdomen: soft,nondistended  Musculoskeletal: perfused, no clubbing   Data Reviewed: Basic Metabolic Panel:  Recent Labs Lab 12/22/13 1908 12/23/13 0425 12/24/13 0545 12/24/13 1315 12/25/13 0553  NA 142 138 140  --  141  K 3.7 3.8 3.9  --  4.0  CL 110 105 107  --  108  CO2 20 19 19   --  19  GLUCOSE 104* 127* 129*  --  111*  BUN 55* 59* 63*  --  67*  CREATININE 3.38* 3.47* 3.99*  --  4.31*  CALCIUM 7.7* 7.5* 7.5*  --  7.5*  MG  --   --   --  2.0  --    Liver Function Tests:  Recent Labs Lab 12/24/13 1315  AST 25  ALT 22  ALKPHOS 99  BILITOT <0.2*  PROT 5.8*  ALBUMIN 1.5*   No results found for this basename: LIPASE, AMYLASE,  in the last 168 hours No results found for this basename: AMMONIA,  in the last 168 hours CBC:  Recent Labs Lab 12/22/13 1908 12/23/13 0425 12/24/13 0545 12/25/13 0553  WBC 5.3 5.9 6.1 7.6  NEUTROABS 3.3  --   --  3.6  HGB 10.8* 9.8* 9.6* 8.7*  HCT 32.2* 29.2* 28.7* 26.3*  MCV 90.2 89.6 89.4 90.4  PLT 324 316 329 311   Cardiac Enzymes:  Recent Labs Lab 12/22/13 2026 12/23/13 0425 12/23/13 0955  TROPONINI <0.30 <0.30 <0.30   BNP (last 3 results) No results found for this basename: PROBNP,  in the last 8760 hours CBG:  Recent Labs Lab 12/24/13 0634 12/24/13 1140 12/24/13 1638 12/24/13 2043 12/25/13 0623  GLUCAP 126* 156* 110* 164* 108*    Recent Results (from the past 240 hour(s))  CULTURE, BLOOD (ROUTINE X 2)     Status: None   Collection Time    12/22/13 10:28 PM      Result Value Range Status   Specimen Description BLOOD RIGHT HAND   Final   Special Requests BOTTLES DRAWN AEROBIC ONLY 3CC   Final   Culture  Setup Time     Final   Value: 04/03/2013 04:36     Performed at [facility]   Culture     Final   Value:        BLOOD CULTURE RECEIVED NO GROWTH  TO DATE CULTURE WILL BE HELD FOR 5 DAYS BEFORE ISSUING A FINAL NEGATIVE REPORT     Performed at [facility]   Report Status PENDING   Incomplete  CULTURE, BLOOD (ROUTINE X 2)     Status: None   Collection Time    12/22/13 10:40 PM      Result Value Range Status   Specimen Description BLOOD LEFT HAND   Final   Special Requests BOTTLES DRAWN AEROBIC ONLY [facility]   Final   Culture  Setup Time     Final   Value: 04/03/2013 04:36     Performed at [facility]  Final   Value:        BLOOD CULTURE RECEIVED NO GROWTH TO DATE CULTURE WILL BE HELD FOR 5 DAYS BEFORE ISSUING A FINAL NEGATIVE REPORT     Performed at [facility]   Report Status PENDING   Incomplete  MRSA PCR SCREENING     Status: None   Collection Time    12/23/13  2:24 AM      Result Value Range Status   MRSA by PCR NEGATIVE  NEGATIVE Final   Comment:            The GeneXpert MRSA Assay (FDA     approved for NASAL specimens     only), is one component of a     comprehensive MRSA colonization     surveillance program. It is not     intended to diagnose MRSA     infection nor to guide or     monitor treatment for     MRSA infections.  CULTURE, BLOOD (ROUTINE X 2)     Status: None   Collection Time    12/23/13  4:25 AM      Result Value Range Status   Specimen Description BLOOD RIGHT ANTECUBITAL   Final   Special Requests BOTTLES DRAWN AEROBIC ONLY 5CC   Final   Culture  Setup Time     Final   Value: 06-19-2013 08:58     Performed at [facility]   Culture     Final   Value:        BLOOD CULTURE RECEIVED NO GROWTH TO DATE CULTURE WILL BE HELD FOR 5 DAYS BEFORE ISSUING A FINAL NEGATIVE REPORT     Performed at [facility]   Report Status PENDING   Incomplete  CULTURE, BLOOD (ROUTINE X 2)     Status: None   Collection Time    12/23/13  4:30 AM      Result Value Range Status   Specimen Description BLOOD RIGHT HAND   Final   Special Requests BOTTLES DRAWN AEROBIC ONLY  3CC   Final   Culture  Setup Time     Final   Value: 06-19-2013 08:58     Performed at [facility]   Culture     Final   Value:        BLOOD CULTURE RECEIVED NO GROWTH TO DATE CULTURE WILL BE HELD FOR 5 DAYS BEFORE ISSUING A FINAL NEGATIVE REPORT     Performed at [facility]   Report Status PENDING   Incomplete  URINE CULTURE     Status: None   Collection Time    12/23/13 10:11 AM      Result Value Range Status   Specimen Description URINE, RANDOM   Final   Special Requests NONE   Final   Culture  Setup Time     Final   Value: 06-19-2013 17:57     Performed at [place]     Final   Value: NO GROWTH     Performed at [facility]   Culture     Final   Value: NO GROWTH     Performed at [facility]   Report Status 12/24/2013 FINAL   Final     Studies: US Renal  06-19-2013   CLINICAL DATA:  Acute renal failure  EXAM: RENAL/URINARY TRACT ULTRASOUND COMPLETE  COMPARISON:  None.  FINDINGS: Right Kidney:  Length: 5.2 cm. There is  increased renal cortical echogenicity. There is a 1.6 x 1.8 x 2 cm anechoic right renal mass most consistent with a cyst.  Left Kidney:  Length: 10.9 cm. There is increased renal cortical echogenicity. No mass or hydronephrosis visualized.  Bladder:  Appears normal for degree of bladder distention.  Incidental note are made of splenic punctate hyperechoic foci likely reflecting calcifications from prior granulomatous disease. There is a small amount of perisplenic fluid.  IMPRESSION: 1. No obstructive uropathy.  2. Increased bilateral renal cortical echogenicity as can be seen with medical renal disease.  3.  Small amount of nonspecific perisplenic fluid.   Electronically Signed   By: Kathreen Devoid   On: 02/21/202014 12:30    Scheduled Meds: . amLODipine  10 mg Oral Daily  . aspirin  325 mg Oral Daily  . azithromycin  500 mg Oral Q24H  . cefTRIAXone (ROCEPHIN)  IV  1 g Intravenous Q24H  . guaiFENesin  600  mg Oral BID  . heparin  5,000 Units Subcutaneous Q8H  . insulin aspart  0-5 Units Subcutaneous QHS  . insulin aspart  0-9 Units Subcutaneous TID WC  . levalbuterol  0.63 mg Nebulization Q8H  . metoprolol tartrate  25 mg Oral BID  . sodium chloride  3 mL Intravenous Q12H   Continuous Infusions:   Active Problems:   HYPERLIPIDEMIA   PSYCHIATRIC DISORDER   DEAF MUTISM   HYPERTENSION, ESSENTIAL, UNCONTROLLED   LEG EDEMA   Acute respiratory failure with hypoxia   Community acquired pneumonia   DM type 2 causing CKD stage 4   Acute renal failure   SVT (supraventricular tachycardia)   NSVT (nonsustained ventricular tachycardia)   Abnormal echocardiogram   Chronic diastolic CHF (congestive heart failure)   Anemia   Constipation  Time spent: 95min  [facility], [facility] Pager [phone]. If 7PM-7AM, please contact night-coverage at [email or web address], password [facility] 12/25/2013, 9:29 AM  LOS: 3 days

## 2013-12-25 NOTE — Consult Note (Signed)
I saw the patient and agree with the above assessment and plan.    Pt presenting with nonoliguric AKI in setting of CAP and untreated HTN.  She does not appaer to have have baseline CKD as best can be told.  No obstruction by [place] and have spoken with readiology that R kidney is of normal size, not 5 cm.  No clear nephrotoxins.  Has not improved with IVFs to this point.  I suspect she has ATN at this time, although given her comorbidities and communication challenges the exact story is unclear.  Cardiology is considering amyloid based on suggestive echocardiographic features.  SPEP and UPEP ordered.  Will also check sFLC.  Will see if nephrotic range proteinuria present.  I would be very slow to consider renal biopsy based on current evidence available.  Probably can stop IVFs in next 24h.  She appears volume replete and this is certainly contributing to HTN.    Pearson Grippe, MD

## 2013-12-25 NOTE — Consult Note (Signed)
Reason for Consult: Capacity evaluation Referring Physician: Donne Hazel, MD   [name] is an 58 y.o. female.  HPI: Patient was seen and chart reviewed. Information for this evaluation obtained via sign language interpreter. Patient has stated that she has been suffering with physical sickness, generalized body pains, tired and endorses being sad and depressed. She has stated that she has been thinking clear, and mentally sound even though she has hearing and vision impairments. She stated that she has been trying to get better and trying eat when food is provided. She has denied history of mental illness or treatment. She has been getting better medically with current medical treatment. She has denied psychosis, paranoia, hallucinations and suicidal/homicidal ideations. She has been ruminated about her medical illness and wishes to get well soon.   Mental Status Examination: Patient is sitting in a chair next to her bed. She has IV line and receiving medication for pneumonia. She is awake, alert and oriented. She has depressed/sad mood and constricted affect. Patient has no evidence of auditory or visual hallucinations, delusions, and paranoia. Patient has fair insight judgment and impulse control.  Past Medical History  Diagnosis Date  . Hyperlipidemia   . Hypertension     a. Has previously refused blood pressure meds.   . Deaf     Divorced from husband but lives with him. Has daughter but she does not care for her.  . Bilateral leg edema     a. Chronic.  Marland Kitchen Psychiatric disorder     She frequently exhibits paranoia and has been diagnosed with psychotic d/o NOS during hospital stay in the past. She is tangetial and perseverative during her visits. She apparently has had a bad experience with mental health in [place] in the past and refuses to discuss mental issues for fear that she will be sent back there. Her paranoia, communication issues, financial woes and lack of fam  . Fibroid uterus    . Anemia     Due to fibroids.  . Heart murmur   . Diabetes mellitus     a. Per PCP note 2012 (A1C 9.2) - pt unwilling to take meds and was educated on risk of uncontrolled DM.  b. A1C 5.9 in 11/2013.    Past Surgical History  Procedure Laterality Date  . No past surgeries      Family History  Problem Relation Age of Onset  . Other Father     Drowned from fishing?  . Heart disease      Social History:  reports that she has never smoked. She does not have any smokeless tobacco history on file. She reports that she does not drink alcohol or use illicit drugs.  Allergies: No Known Allergies  Medications: I have reviewed the patient's current medications.  Results for orders placed during the hospital encounter of 12/22/13 (from the past 48 hour(s))  GLUCOSE, CAPILLARY     Status: Abnormal   Collection Time    12/23/13  4:15 PM      Result Value Range   Glucose-Capillary 166 (*) 70 - 99 mg/dL  GLUCOSE, CAPILLARY     Status: Abnormal   Collection Time    12/23/13  7:49 PM      Result Value Range   Glucose-Capillary 193 (*) 70 - 99 mg/dL   Comment 1 Documented in Chart     Comment 2 Notify RN    STREP PNEUMONIAE URINARY ANTIGEN     Status: None   Collection Time  12/24/13  4:11 AM      Result Value Range   Strep Pneumo Urinary Antigen NEGATIVE  NEGATIVE   Comment:            Infection due to S. pneumoniae     cannot be absolutely ruled out     since the antigen present     may be below the detection limit     of the test.  CBC     Status: Abnormal   Collection Time    12/24/13  5:45 AM      Result Value Range   WBC 6.1  4.0 - 10.5 K/uL   RBC 3.21 (*) 3.87 - 5.11 MIL/uL   Hemoglobin 9.6 (*) 12.0 - 15.0 g/dL   HCT 28.7 (*) 36.0 - 46.0 %   MCV 89.4  78.0 - 100.0 fL   MCH 29.9  26.0 - 34.0 pg   MCHC 33.4  30.0 - 36.0 g/dL   RDW 12.9  11.5 - 15.5 %   Platelets 329  150 - 400 K/uL  BASIC METABOLIC PANEL     Status: Abnormal   Collection Time    12/24/13  5:45 AM       Result Value Range   Sodium 140  137 - 147 mEq/L   Comment: Please note change in reference range.   Potassium 3.9  3.7 - 5.3 mEq/L   Comment: Please note change in reference range.   Chloride 107  96 - 112 mEq/L   CO2 19  19 - 32 mEq/L   Glucose, Bld 129 (*) 70 - 99 mg/dL   BUN 63 (*) 6 - 23 mg/dL   Creatinine, Ser 3.99 (*) 0.50 - 1.10 mg/dL   Calcium 7.5 (*) 8.4 - 10.5 mg/dL   GFR calc non Af Amer 12 (*) >90 mL/min   GFR calc Af Amer 13 (*) >90 mL/min   Comment: (NOTE)     The eGFR has been calculated using the CKD EPI equation.     This calculation has not been validated in all clinical situations.     eGFR's persistently <90 mL/min signify possible Chronic Kidney     Disease.  GLUCOSE, CAPILLARY     Status: Abnormal   Collection Time    12/24/13  6:34 AM      Result Value Range   Glucose-Capillary 126 (*) 70 - 99 mg/dL   Comment 1 Documented in Chart     Comment 2 Notify RN    GLUCOSE, CAPILLARY     Status: Abnormal   Collection Time    12/24/13 11:40 AM      Result Value Range   Glucose-Capillary 156 (*) 70 - 99 mg/dL  HEPATIC FUNCTION PANEL     Status: Abnormal   Collection Time    12/24/13  1:15 PM      Result Value Range   Total Protein 5.8 (*) 6.0 - 8.3 g/dL   Albumin 1.5 (*) 3.5 - 5.2 g/dL   AST 25  0 - 37 U/L   ALT 22  0 - 35 U/L   Alkaline Phosphatase 99  39 - 117 U/L   Total Bilirubin <0.2 (*) 0.3 - 1.2 mg/dL   Comment: REPEATED TO VERIFY   Bilirubin, Direct <0.2  0.0 - 0.3 mg/dL   Comment: REPEATED TO VERIFY   Indirect Bilirubin NOT CALCULATED  0.3 - 0.9 mg/dL  TSH     Status: None   Collection Time    12/24/13  1:15 PM      Result Value Range   TSH 3.430  0.350 - 4.500 uIU/mL   Comment: Performed at [facility]  T4, FREE     Status: None   Collection Time    12/24/13  1:15 PM      Result Value Range   Free T4 1.20  0.80 - 1.80 ng/dL   Comment: Performed at [facility]     Status: None   Collection Time     12/24/13  1:15 PM      Result Value Range   Magnesium 2.0  1.5 - 2.5 mg/dL  GLUCOSE, CAPILLARY     Status: Abnormal   Collection Time    12/24/13  4:38 PM      Result Value Range   Glucose-Capillary 110 (*) 70 - 99 mg/dL  GLUCOSE, CAPILLARY     Status: Abnormal   Collection Time    12/24/13  8:43 PM      Result Value Range   Glucose-Capillary 164 (*) 70 - 99 mg/dL   Comment 1 Documented in Chart     Comment 2 Notify RN    BASIC METABOLIC PANEL     Status: Abnormal   Collection Time    12/25/13  5:53 AM      Result Value Range   Sodium 141  137 - 147 mEq/L   Comment: Please note change in reference range.   Potassium 4.0  3.7 - 5.3 mEq/L   Comment: Please note change in reference range.   Chloride 108  96 - 112 mEq/L   CO2 19  19 - 32 mEq/L   Glucose, Bld 111 (*) 70 - 99 mg/dL   BUN 67 (*) 6 - 23 mg/dL   Creatinine, Ser 4.31 (*) 0.50 - 1.10 mg/dL   Calcium 7.5 (*) 8.4 - 10.5 mg/dL   GFR calc non Af Amer 10 (*) >90 mL/min   GFR calc Af Amer 12 (*) >90 mL/min   Comment: (NOTE)     The eGFR has been calculated using the CKD EPI equation.     This calculation has not been validated in all clinical situations.     eGFR's persistently <90 mL/min signify possible Chronic Kidney     Disease.  CBC WITH DIFFERENTIAL     Status: Abnormal   Collection Time    12/25/13  5:53 AM      Result Value Range   WBC 7.6  4.0 - 10.5 K/uL   RBC 2.91 (*) 3.87 - 5.11 MIL/uL   Hemoglobin 8.7 (*) 12.0 - 15.0 g/dL   HCT 26.3 (*) 36.0 - 46.0 %   MCV 90.4  78.0 - 100.0 fL   MCH 29.9  26.0 - 34.0 pg   MCHC 33.1  30.0 - 36.0 g/dL   RDW 13.1  11.5 - 15.5 %   Platelets 311  150 - 400 K/uL   Neutrophils Relative % 48  43 - 77 %   Lymphocytes Relative 45  12 - 46 %   Monocytes Relative 6  3 - 12 %   Eosinophils Relative 1  0 - 5 %   Basophils Relative 0  0 - 1 %   Neutro Abs 3.6  1.7 - 7.7 K/uL   Lymphs Abs 3.4  0.7 - 4.0 K/uL   Monocytes Absolute 0.5  0.1 - 1.0 K/uL   Eosinophils Absolute 0.1   0.0 - 0.7 K/uL   Basophils Absolute 0.0  0.0 - 0.1 K/uL   RBC Morphology CRENATED RBCs     Comment: POLYCHROMASIA PRESENT     ELLIPTOCYTES  GLUCOSE, CAPILLARY     Status: Abnormal   Collection Time    12/25/13  6:23 AM      Result Value Range   Glucose-Capillary 108 (*) 70 - 99 mg/dL   Comment 1 Documented in Chart     Comment 2 Notify RN    GLUCOSE, CAPILLARY     Status: Abnormal   Collection Time    12/25/13 11:16 AM      Result Value Range   Glucose-Capillary 135 (*) 70 - 99 mg/dL    No results found.  Positive for anxiety, bad mood and depression Blood pressure 184/79, pulse 82, temperature 97.9 F (36.6 C), temperature source Oral, resp. rate 20, height 5' 4"  (1.626 m), weight 104.2 kg (229 lb 11.5 oz), SpO2 100.00%.   Assessment/Plan: Mood disorder NOS  Capacity evaluation  Recommendation:  Case discussed with Dr. Grandville Silos and sign language interpreter used due to deafness and partial blindness and needs sign language interpreter Patient does not meet criteria for acute psych hospitalization Recommend Fluoxetine 20 mg PO Q am Appreciate psych consultation May contact at [phone] if needs further assistance    [name],JANARDHAHA R. 12/25/2013, 11:54 AM

## 2013-12-25 NOTE — Progress Notes (Signed)
SUBJECTIVE:  She says that her abdomen is less uncomfortable.  However, she is very fatigued.     PHYSICAL EXAM Filed Vitals:   12/24/13 1511 12/24/13 1959 12/24/13 2043 12/25/13 0515  BP: 164/75  160/68 184/79  Pulse: 81  85 82  Temp: 97.4 F (36.3 C)  98 F (36.7 C) 97.9 F (36.6 C)  TempSrc: Oral  Oral Oral  Resp: 18  18 20   Height:      Weight:    229 lb 11.5 oz (104.2 kg)  SpO2: 100% 97% 99% 100%   General:  No acute distress Lungs:  Diffuse wheezes Heart:  RRR Abdomen:  Positive bowel sounds, no rebound no guarding Extremities:  Moderate edema.   LABS:  Results for orders placed during the hospital encounter of 12/22/13 (from the past 24 hour(s))  GLUCOSE, CAPILLARY     Status: Abnormal   Collection Time    12/24/13 11:40 AM      Result Value Range   Glucose-Capillary 156 (*) 70 - 99 mg/dL  HEPATIC FUNCTION PANEL     Status: Abnormal   Collection Time    12/24/13  1:15 PM      Result Value Range   Total Protein 5.8 (*) 6.0 - 8.3 g/dL   Albumin 1.5 (*) 3.5 - 5.2 g/dL   AST 25  0 - 37 U/L   ALT 22  0 - 35 U/L   Alkaline Phosphatase 99  39 - 117 U/L   Total Bilirubin <0.2 (*) 0.3 - 1.2 mg/dL   Bilirubin, Direct <0.2  0.0 - 0.3 mg/dL   Indirect Bilirubin NOT CALCULATED  0.3 - 0.9 mg/dL  TSH     Status: None   Collection Time    12/24/13  1:15 PM      Result Value Range   TSH 3.430  0.350 - 4.500 uIU/mL  T4, FREE     Status: None   Collection Time    12/24/13  1:15 PM      Result Value Range   Free T4 1.20  0.80 - 1.80 ng/dL  MAGNESIUM     Status: None   Collection Time    12/24/13  1:15 PM      Result Value Range   Magnesium 2.0  1.5 - 2.5 mg/dL  GLUCOSE, CAPILLARY     Status: Abnormal   Collection Time    12/24/13  4:38 PM      Result Value Range   Glucose-Capillary 110 (*) 70 - 99 mg/dL  GLUCOSE, CAPILLARY     Status: Abnormal   Collection Time    12/24/13  8:43 PM      Result Value Range   Glucose-Capillary 164 (*) 70 - 99 mg/dL   Comment  1 Documented in Chart     Comment 2 Notify RN    BASIC METABOLIC PANEL     Status: Abnormal   Collection Time    12/25/13  5:53 AM      Result Value Range   Sodium 141  137 - 147 mEq/L   Potassium 4.0  3.7 - 5.3 mEq/L   Chloride 108  96 - 112 mEq/L   CO2 19  19 - 32 mEq/L   Glucose, Bld 111 (*) 70 - 99 mg/dL   BUN 67 (*) 6 - 23 mg/dL   Creatinine, Ser 4.31 (*) 0.50 - 1.10 mg/dL   Calcium 7.5 (*) 8.4 - 10.5 mg/dL   GFR calc non Af Amer 10 (*) >  90 mL/min   GFR calc Af Amer 12 (*) >90 mL/min  CBC WITH DIFFERENTIAL     Status: Abnormal   Collection Time    12/25/13  5:53 AM      Result Value Range   WBC 7.6  4.0 - 10.5 K/uL   RBC 2.91 (*) 3.87 - 5.11 MIL/uL   Hemoglobin 8.7 (*) 12.0 - 15.0 g/dL   HCT 26.3 (*) 36.0 - 46.0 %   MCV 90.4  78.0 - 100.0 fL   MCH 29.9  26.0 - 34.0 pg   MCHC 33.1  30.0 - 36.0 g/dL   RDW 13.1  11.5 - 15.5 %   Platelets 311  150 - 400 K/uL   Neutrophils Relative % 48  43 - 77 %   Lymphocytes Relative 45  12 - 46 %   Monocytes Relative 6  3 - 12 %   Eosinophils Relative 1  0 - 5 %   Basophils Relative 0  0 - 1 %   Neutro Abs 3.6  1.7 - 7.7 K/uL   Lymphs Abs 3.4  0.7 - 4.0 K/uL   Monocytes Absolute 0.5  0.1 - 1.0 K/uL   Eosinophils Absolute 0.1  0.0 - 0.7 K/uL   Basophils Absolute 0.0  0.0 - 0.1 K/uL   RBC Morphology CRENATED RBCs    GLUCOSE, CAPILLARY     Status: Abnormal   Collection Time    12/25/13  6:23 AM      Result Value Range   Glucose-Capillary 108 (*) 70 - 99 mg/dL   Comment 1 Documented in Chart     Comment 2 Notify RN      Intake/Output Summary (Last 24 hours) at 12/25/13 0927 Last data filed at 12/25/13 0849  Gross per 24 hour  Intake   1010 ml  Output   1476 ml  Net   -466 ml    ASSESSMENT AND PLAN:   HTN:   I will increase the beta blocker dose slightly.  We will consolidate to XL dosing before discharge.  SVT:  This will be treated as above.  LVH:  Labs ordered as in consult note.  Further evaluation will be challenging  with her lack of understanding.  However, I will pursue work up of possible amyloid heart as an outpatient.   Jeneen Rinks [facility] 12/25/2013 9:27 AM

## 2013-12-26 ENCOUNTER — Inpatient Hospital Stay (HOSPITAL_COMMUNITY): Payer: Medicare Other

## 2013-12-26 LAB — RENAL FUNCTION PANEL
Albumin: 1.5 g/dL — ABNORMAL LOW (ref 3.5–5.2)
BUN: 64 mg/dL — ABNORMAL HIGH (ref 6–23)
CO2: 19 mEq/L (ref 19–32)
Calcium: 7.4 mg/dL — ABNORMAL LOW (ref 8.4–10.5)
Chloride: 106 mEq/L (ref 96–112)
Creatinine, Ser: 4.3 mg/dL — ABNORMAL HIGH (ref 0.50–1.10)
GFR calc Af Amer: 12 mL/min — ABNORMAL LOW (ref >90)
GFR calc non Af Amer: 11 mL/min — ABNORMAL LOW (ref >90)
Glucose, Bld: 114 mg/dL — ABNORMAL HIGH (ref 70–99)
Phosphorus: 6 mg/dL — ABNORMAL HIGH (ref 2.3–4.6)
Potassium: 4.1 mEq/L (ref 3.7–5.3)
Sodium: 139 mEq/L (ref 137–147)

## 2013-12-26 LAB — BLOOD GAS, ARTERIAL
Acid-base deficit: 7.3 mmol/L — ABNORMAL HIGH (ref 0.0–2.0)
Bicarbonate: 17.5 mEq/L — ABNORMAL LOW (ref 20.0–24.0)
Drawn by: 277551
O2 Content: 2.5 L/min
O2 Saturation: 99.2 %
Patient temperature: 98.6
TCO2: 18.6 mmol/L (ref 0–100)
pCO2 arterial: 34.3 mmHg — ABNORMAL LOW (ref 35.0–45.0)
pH, Arterial: 7.328 — ABNORMAL LOW (ref 7.350–7.450)
pO2, Arterial: 138 mmHg — ABNORMAL HIGH (ref 80.0–100.0)

## 2013-12-26 LAB — GLUCOSE, CAPILLARY
Glucose-Capillary: 101 mg/dL — ABNORMAL HIGH (ref 70–99)
Glucose-Capillary: 128 mg/dL — ABNORMAL HIGH (ref 70–99)
Glucose-Capillary: 106 mg/dL — ABNORMAL HIGH (ref 70–99)
Glucose-Capillary: 125 mg/dL — ABNORMAL HIGH (ref 70–99)

## 2013-12-26 LAB — PROTEIN / CREATININE RATIO, URINE
Creatinine, Urine: 67.41 mg/dL
Protein Creatinine Ratio: 10.78 — ABNORMAL HIGH (ref 0.00–0.15)
Total Protein, Urine: 726.6 mg/dL

## 2013-12-26 LAB — LEGIONELLA ANTIGEN, URINE: Legionella Antigen, Urine: NEGATIVE

## 2013-12-26 MED ORDER — LEVALBUTEROL HCL 0.63 MG/3ML IN NEBU
0.6300 mg | INHALATION_SOLUTION | Freq: Three times a day (TID) | RESPIRATORY_TRACT | Status: DC
  Administered 2013-12-26 – 2013-12-31 (×15): 0.63 mg via RESPIRATORY_TRACT
  Filled 2013-12-26 (×29): qty 3

## 2013-12-26 MED ORDER — IPRATROPIUM BROMIDE 0.02 % IN SOLN: 0.5000 mg | Freq: Four times a day (QID) | RESPIRATORY_TRACT | Status: DC

## 2013-12-26 MED ORDER — LEVALBUTEROL HCL 0.63 MG/3ML IN NEBU: 0.6300 mg | INHALATION_SOLUTION | RESPIRATORY_TRACT | Status: DC | PRN

## 2013-12-26 MED ORDER — GUAIFENESIN ER 600 MG PO TB12
1200.0000 mg | ORAL_TABLET | Freq: Two times a day (BID) | ORAL | Status: DC
Start: 2013-12-26 — End: 2014-01-08
  Administered 2013-12-26 – 2014-01-08 (×25): 1200 mg via ORAL
  Filled 2013-12-26 (×27): qty 2

## 2013-12-26 MED ORDER — METOPROLOL TARTRATE 50 MG PO TABS
75.0000 mg | ORAL_TABLET | Freq: Two times a day (BID) | ORAL | Status: DC
  Administered 2013-12-26 – 2013-12-27 (×3): 75 mg via ORAL
  Filled 2013-12-26 (×4): qty 1

## 2013-12-26 MED ORDER — HYDRALAZINE HCL 25 MG PO TABS
25.0000 mg | ORAL_TABLET | Freq: Three times a day (TID) | ORAL | Status: DC
  Administered 2013-12-26 – 2013-12-28 (×6): 25 mg via ORAL
  Filled 2013-12-26 (×11): qty 1

## 2013-12-26 MED ORDER — ALBUTEROL SULFATE (2.5 MG/3ML) 0.083% IN NEBU: 2.5000 mg | INHALATION_SOLUTION | Freq: Four times a day (QID) | RESPIRATORY_TRACT | Status: DC

## 2013-12-26 MED ORDER — IPRATROPIUM BROMIDE 0.02 % IN SOLN
0.5000 mg | Freq: Three times a day (TID) | RESPIRATORY_TRACT | Status: DC
  Administered 2013-12-26 – 2013-12-31 (×15): 0.5 mg via RESPIRATORY_TRACT
  Filled 2013-12-26 (×14): qty 2.5

## 2013-12-26 MED ORDER — IPRATROPIUM BROMIDE 0.02 % IN SOLN
0.5000 mg | RESPIRATORY_TRACT | Status: DC | PRN
  Filled 2013-12-26: qty 2.5

## 2013-12-26 MED ORDER — IPRATROPIUM BROMIDE 0.02 % IN SOLN: 0.5000 mg | Freq: Three times a day (TID) | RESPIRATORY_TRACT | Status: DC

## 2013-12-26 NOTE — Progress Notes (Signed)
[facility] PROGRESS NOTE  [name] [id number] DOB: 04-26-1956 DOA: 12/22/2013 PCP: Pcp Not In System Off service summary: 58 y.o.female with history of hypertension, hyperlipidemia, hearing impaired, ? Developmental disability vs psychiatric d/o, anemia, prior HgbA1c of 9.2 in 2012 and heart murmur who presented c/o SOB and racing heart. Sign language interpreter was used to help translate. Patient was a poor historian. She reported that she had been feeling weak since this past summer. However, after Christmas she developed cough, sinus congestion and drainage. She initially went to [facility]. She was found to be tachycardic with heart rate of 170s. She was given PO diltiazem without relief. She was given adenosine and subsequently converted to sinus rhythm. Chest x-ray showed right perihilar airspace disease concerning for pneumonia.  2d echo obtained with findings worrisome for possible amyloid. W/u pending. Pt also has ARF that does not seem to be improved with IVF. Nephrology is consulted and are following. Pt has a remote hx of schizophrenia, currently untreated. Psych was consulted at the request of pt's daughter. Assessment/Plan: Acute respiratory failure with hypoxia/Community acquired pneumonia  Patient with some audible wheezing per nursing. Check a chest x-ray. Check ABG. Patient still with a cough and physical exam with some minimal to mild expiratory wheezing. Continue empiric IV Rocephin and azithromycin. Increased Mucinex to 1200 mg twice a day. Continue scheduled nebulizer treatment. Continue oxygen. Will give a one-time dose of IV Solu-Medrol. -Flu PCR negative  Tachycardia/SVT??  -resolved and likely physiologic due to hypoxia  -follow on tele  HYPERTENSION, ESSENTIAL, UNCONTROLLED  -issues of noncompliance in past due to reported disbelief that she actually has this diagnosis. Discontinue Norvasc. Change Lopressor to 75 mg by mouth twice a day. Start  hydralazine 50 mg 3 times a day. Follow Severe concentric left ventricular hypertrophy/moderate pulmonary HTN/grade 1 diastolic dysfunction  -apparent new findings- no prior ECHO to compare - ?? HOCM  -? of underlying amyloid so Cards consulted - appreciate recs  Chronic bilateral LEG EDEMA  -stable at present. Will saline lock IV fluids. Discontinue Norvasc. Repeat chest x-ray.??? May need low-dose diuretics however will defer to nephrology. ?? DM type 2  -previous HgA1c was 9.2 in 2012  -this admit 5.9  -pt admitted to significant dietary changes after was initially told she had DM including eliminating exogenous sugar  -at present has diet controlled DM and no indication to begin medication  CKD stage 4  -renal US shows mild medical renal dz and no old Scr to compare  -if concern re: amyloid (see above) may need bx -Nephrology consulted -Not improving with hydration Renal function worsening creatinine currently at 4.30 today. We'll see her lock IV fluids. Patient with bilateral lower extremity edema.?? Start diuretics however will defer to nephrology. HYPERLIPIDEMIA  ?? PSYCHIATRIC DISORDER/DEAF  -previously dx'd with psychotic disorder NOS in 2011  -not on meds pre admit  -seems to have a degree of developmental disability and combined with her hearing loss she certainly could develop acute psychotic sx's  -for best interaction with this patient use of the sign language inter[retor is best -Question of decision making capacity. Consult Psychiatry for input -Pt's daughter reports a hx of prior schizophrenia and is concerned that pt is currently not being treated for this (had previously been treated by psych) Psychiatric consultation pending.  Code Status: Full Family Communication: Pt in room (indicate person spoken with, relationship, and if by phone, the number). No family present. Disposition Plan: Pending  Consultants: Cardiology Nephrology  Antibiotics:  Zithromax 12/29  >>>  Rocephin 12/29 >>>  HPI/Subjective: Per nursing patient with audible wheezing this morning.  Complains of blurry vision (in the setting of known diabetic retinopathy with planned laser treatment as an outpatient). Patient states feeling sick. No change in cough.  Objective: Filed Vitals:   12/25/13 2002 12/25/13 2008 12/26/13 0549 12/26/13 0933  BP: 174/81  179/87   Pulse: 83  88   Temp: 97.5 F (36.4 C)  97.8 F (36.6 C)   TempSrc: Oral  Oral   Resp: 18  18   Height:      Weight:   107.23 kg (236 lb 6.4 oz)   SpO2: 100% 96% 100% 99%    Intake/Output Summary (Last 24 hours) at 12/26/13 1028 Last data filed at 12/26/13 0800  Gross per 24 hour  Intake 1403.33 ml  Output    700 ml  Net 703.33 ml   Filed Weights   12/24/13 0419 12/25/13 0515 12/26/13 0549  Weight: 102.785 kg (226 lb 9.6 oz) 104.2 kg (229 lb 11.5 oz) 107.23 kg (236 lb 6.4 oz)    Exam:   General:  Awake, in nad  Cardiovascular: Regular, s1, s2  Respiratory: normal resp effort, mild exp wheezing. Coarse BS  Abdomen: soft,nondistended, positive bowel sounds, nontender  Musculoskeletal: perfused, no clubbing. 3+ bilateral lower extremity edema  Data Reviewed: Basic Metabolic Panel:  Recent Labs Lab 12/22/13 1908 12/23/13 0425 12/24/13 0545 12/24/13 1315 12/25/13 0553 12/26/13 0645  NA 142 138 140  --  141 139  K 3.7 3.8 3.9  --  4.0 4.1  CL 110 105 107  --  108 106  CO2 20 19 19   --  19 19  GLUCOSE 104* 127* 129*  --  111* 114*  BUN 55* 59* 63*  --  67* 64*  CREATININE 3.38* 3.47* 3.99*  --  4.31* 4.30*  CALCIUM 7.7* 7.5* 7.5*  --  7.5* 7.4*  MG  --   --   --  2.0  --   --   PHOS  --   --   --   --   --  6.0*   Liver Function Tests:  Recent Labs Lab 12/24/13 1315 12/26/13 0645  AST 25  --   ALT 22  --   ALKPHOS 99  --   BILITOT <0.2*  --   PROT 5.8*  --   ALBUMIN 1.5* 1.5*   No results found for this basename: LIPASE, AMYLASE,  in the last 168 hours No results found for  this basename: AMMONIA,  in the last 168 hours CBC:  Recent Labs Lab 12/22/13 1908 12/23/13 0425 12/24/13 0545 12/25/13 0553  WBC 5.3 5.9 6.1 7.6  NEUTROABS 3.3  --   --  3.6  HGB 10.8* 9.8* 9.6* 8.7*  HCT 32.2* 29.2* 28.7* 26.3*  MCV 90.2 89.6 89.4 90.4  PLT 324 316 329 311   Cardiac Enzymes:  Recent Labs Lab 12/22/13 2026 12/23/13 0425 12/23/13 0955  TROPONINI <0.30 <0.30 <0.30   BNP (last 3 results) No results found for this basename: PROBNP,  in the last 8760 hours CBG:  Recent Labs Lab 12/25/13 0623 12/25/13 1116 12/25/13 1642 12/25/13 2111 12/26/13 0622  GLUCAP 108* 135* 133* 133* 101*    Recent Results (from the past 240 hour(s))  CULTURE, BLOOD (ROUTINE X 2)     Status: None   Collection Time    12/22/13 10:28 PM      Result Value Range Status  Specimen Description BLOOD RIGHT HAND   Final   Special Requests BOTTLES DRAWN AEROBIC ONLY 3CC   Final   Culture  Setup Time     Final   Value: 2020-06-1913 04:36     Performed at [facility]   Culture     Final   Value:        BLOOD CULTURE RECEIVED NO GROWTH TO DATE CULTURE WILL BE HELD FOR 5 DAYS BEFORE ISSUING A FINAL NEGATIVE REPORT     Performed at [facility]   Report Status PENDING   Incomplete  CULTURE, BLOOD (ROUTINE X 2)     Status: None   Collection Time    12/22/13 10:40 PM      Result Value Range Status   Specimen Description BLOOD LEFT HAND   Final   Special Requests BOTTLES DRAWN AEROBIC ONLY [facility]   Final   Culture  Setup Time     Final   Value: 2020-06-1913 04:36     Performed at [facility]   Culture     Final   Value:        BLOOD CULTURE RECEIVED NO GROWTH TO DATE CULTURE WILL BE HELD FOR 5 DAYS BEFORE ISSUING A FINAL NEGATIVE REPORT     Performed at [facility]   Report Status PENDING   Incomplete  MRSA PCR SCREENING     Status: None   Collection Time    12/23/13  2:24 AM      Result Value Range Status   MRSA by PCR NEGATIVE  NEGATIVE Final    Comment:            The GeneXpert MRSA Assay (FDA     approved for NASAL specimens     only), is one component of a     comprehensive MRSA colonization     surveillance program. It is not     intended to diagnose MRSA     infection nor to guide or     monitor treatment for     MRSA infections.  CULTURE, BLOOD (ROUTINE X 2)     Status: None   Collection Time    12/23/13  4:25 AM      Result Value Range Status   Specimen Description BLOOD RIGHT ANTECUBITAL   Final   Special Requests BOTTLES DRAWN AEROBIC ONLY 5CC   Final   Culture  Setup Time     Final   Value: 2020-06-1913 08:58     Performed at [facility]   Culture     Final   Value:        BLOOD CULTURE RECEIVED NO GROWTH TO DATE CULTURE WILL BE HELD FOR 5 DAYS BEFORE ISSUING A FINAL NEGATIVE REPORT     Performed at [facility]   Report Status PENDING   Incomplete  CULTURE, BLOOD (ROUTINE X 2)     Status: None   Collection Time    12/23/13  4:30 AM      Result Value Range Status   Specimen Description BLOOD RIGHT HAND   Final   Special Requests BOTTLES DRAWN AEROBIC ONLY 3CC   Final   Culture  Setup Time     Final   Value: 2020-06-1913 08:58     Performed at [facility]   Culture     Final   Value:        BLOOD CULTURE RECEIVED NO GROWTH TO DATE CULTURE WILL BE HELD FOR 5  DAYS BEFORE ISSUING A FINAL NEGATIVE REPORT     Performed at [facility]   Report Status PENDING   Incomplete  URINE CULTURE     Status: None   Collection Time    12/23/13 10:11 AM      Result Value Range Status   Specimen Description URINE, RANDOM   Final   Special Requests NONE   Final   Culture  Setup Time     Final   Value: 21-Oct-202014 17:57     Performed at [place]     Final   Value: NO GROWTH     Performed at [facility]   Culture     Final   Value: NO GROWTH     Performed at [facility]   Report Status 12/24/2013 FINAL   Final     Studies: Dg Chest Port 1  View  12/26/2013   CLINICAL DATA:  Cough, shortness of breath  EXAM: PORTABLE CHEST - 1 VIEW  COMPARISON:  12/22/2013  FINDINGS: Low lung volumes. Cardiac silhouette is enlarged. There is decreased conspicuity of the perihilar densities. No new focal regions of consolidation or new focal infiltrates. The osseous structures unremarkable.  IMPRESSION: Decreased conspicuity of the perihilar opacities likely reflecting pulmonary edema. No new focal regions of consolidation or new focal infiltrates. Note a component of the decreased conspicuity on the lung findings likely secondary to increased lung volumes when compared to previous study.   Electronically Signed   By: Margaree Mackintosh M.D.   On: 12/26/2013 09:35    Scheduled Meds: . amLODipine  10 mg Oral Daily  . aspirin  325 mg Oral Daily  . azithromycin  500 mg Oral Q24H  . cefTRIAXone (ROCEPHIN)  IV  1 g Intravenous Q24H  . guaiFENesin  600 mg Oral BID  . heparin  5,000 Units Subcutaneous Q8H  . insulin aspart  0-5 Units Subcutaneous QHS  . insulin aspart  0-9 Units Subcutaneous TID WC  . ipratropium  0.5 mg Nebulization Q8H  . levalbuterol  0.63 mg Nebulization Q8H  . metoprolol tartrate  25 mg Oral Q6H  . sodium chloride  3 mL Intravenous Q12H   Continuous Infusions: . sodium chloride 100 mL/hr at 12/26/13 0604    Principal Problem:   Acute respiratory failure with hypoxia Active Problems:   Community acquired pneumonia   HYPERLIPIDEMIA   PSYCHIATRIC DISORDER   DEAF MUTISM   HYPERTENSION, ESSENTIAL, UNCONTROLLED   LEG EDEMA   DM type 2 causing CKD stage 4   Acute renal failure   SVT (supraventricular tachycardia)   NSVT (nonsustained ventricular tachycardia)   Abnormal echocardiogram   Chronic diastolic CHF (congestive heart failure)   Anemia   Constipation  Time spent: 10min  [facility] MD [facility] Pager [phone]. If 7PM-7AM, please contact night-coverage at [email or web address], password [facility] 12/26/2013, 10:28 AM   LOS: 4 days

## 2013-12-26 NOTE — Progress Notes (Signed)
Physical Therapy Treatment Patient Details Name: [name] MRN: [id number] DOB: [birth year] Today's Date: 12/26/2013 Time: [id number] PT Time Calculation (min): 18 min  PT Assessment / Plan / Recommendation  History of Present Illness [name] is a 58 y.o. 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  Sign interpreter was used to help translate.  Patient is a poor historian.  She reports that she has been feeling weak since the summer.  However, after Christmas she developed cough, sinus congestion and drainage.  She initially went to [facility].  She was found to be tachycardic with heart rate of 170s.  She was given diltiazem without relief.  She was given adenosine which broke her out of the rhythm to a sinus rhythm.  Chest x-ray showed right perihilar airspace disease concerning for pneumonia.  Hospitalist service was asked to admit the patient for further care and management.  Currently feels better, complaining of generalized weakness.  Felt nauseated earlier today but has not vomited.  Currently denies any chest pain.  Denies any abdominal pain or diarrhea.   PT Comments   Still using the RW for energy conservation and some balance.  Pt becoming less reliant on the RW. Hopefully will not need to take it home.   Follow Up Recommendations  Home health PT;Supervision - Intermittent     Does the patient have the potential to tolerate intense rehabilitation     Barriers to Discharge        Equipment Recommendations  3in1 (PT);Other (comment) (RW tba before d/c)    Recommendations for Other Services    Frequency Min 3X/week   Progress towards PT Goals Progress towards PT goals: Progressing toward goals  Plan Current plan remains appropriate    Precautions / Restrictions Precautions Precautions: Fall    Pertinent Vitals/Pain 98% on 2 L during amb,  95 bpm    Mobility  Bed Mobility Bed Mobility: Not assessed Transfers Transfers: Sit to Stand;Stand to Sit Sit to Stand: 4: Min assist;With upper extremity assist;From chair/3-in-1 Stand to Sit: 4: Min assist;To chair/3-in-1;With upper extremity assist Details for Transfer Assistance: assist to come forward and lifting assist Ambulation/Gait Ambulation/Gait Assistance: 4: Min guard Ambulation Distance (Feet): 250 Feet Assistive device: Rolling walker Ambulation/Gait Assistance Details: steady gait with moderate cadence Gait Pattern: Step-through pattern Stairs: No    Exercises     PT Diagnosis:    PT Problem List:   PT Treatment Interventions:     PT Goals (current goals can now be found in the care plan section) Acute Rehab PT Goals Patient Stated Goal: wants to feel better, less clumsy and weak Time For Goal Achievement: 01/06/14 Potential to Achieve Goals: Good  Visit Information  Last PT Received On: 12/26/13 Assistance Needed: +1 History of Present Illness: [name] is a 58 y.o. 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  Sign interpreter was used to help translate.  Patient is a poor historian.  She reports that she has been feeling weak since the summer.  However, after Christmas she developed cough, sinus congestion and drainage.  She initially went to [facility].  She was found to be tachycardic with heart rate of 170s.  She was given diltiazem without relief.  She was given  adenosine which broke her out of the rhythm to a sinus rhythm.  Chest x-ray showed right perihilar airspace disease concerning for pneumonia.  Hospitalist service was asked to admit the patient for further care and management.  Currently feels better, complaining of  generalized weakness.  Felt nauseated earlier today but has not vomited.  Currently denies any chest pain.  Denies any abdominal pain or diarrhea.    Subjective Data  Subjective: signed glad to see me and.Marland KitchenMarland KitchenMarland KitchenThankyou Patient Stated Goal: wants to feel better, less clumsy and weak   Cognition  Cognition Arousal/Alertness: Awake/alert Behavior During Therapy: WFL for tasks assessed/performed;Anxious Overall Cognitive Status: Within Functional Limits for tasks assessed    Balance     End of Session PT - End of Session Equipment Utilized During Treatment: Oxygen Activity Tolerance: Patient tolerated treatment well Patient left: in chair;with call bell/phone within reach Nurse Communication: Mobility status;Other (comment)   GP     Mirl Hillery, Tessie Fass 12/26/2013, 4:53 PM 12/26/2013  [name], PT [phone]  (pager)

## 2013-12-26 NOTE — Progress Notes (Signed)
1 Chronic Kidney Disease(diabetic nephropathy or hypertensive) with acute component; I don't think she has amyloid kidney 2 Hypertension with non adherence to treatment 3 Psyche disorder 4 Deaf  Plan: Will follow. BP needs to be controlled.  I think psychiatry needs to be involved or she will continue to not do well.  Subjective: Interval History: SHe had a workup by me in April of 2014. Creat on 5/13 was 1.48 mg/dl. UA showed 4+ protein. S albumin 3.0 g/dl  Renal Ultrasound 11cm kidneys bilat, SPEP neg, ANA neg, ANCA neg., prior micro albumin to creat ratio of 3319.  Past hx of retinopathy. BP was 200/86 on 5/13 and 199/103 on 4/24.  On 6/26 BP was 260/120 and I told her and family to go to the ED.  I am not sure if they went.  She thinks her BP med interferes with her vision  Objective: Vital signs in last 24 hours: Temp:  [97.5 F (36.4 C)-97.8 F (36.6 C)] 97.8 F (36.6 C) (01/02 0549) Pulse Rate:  [79-88] 87 (01/02 1104) Resp:  [18] 18 (01/02 0549) BP: (160-179)/(73-87) 170/78 mmHg (01/02 1104) SpO2:  [96 %-100 %] 99 % (01/02 0933) Weight:  [107.23 kg (236 lb 6.4 oz)] 107.23 kg (236 lb 6.4 oz) (01/02 0549) Weight change: 3.03 kg (6 lb 10.9 oz)  Intake/Output from previous day: 01/01 0701 - 01/02 0700 In: 1283.3 [P.O.:480; I.V.:803.3] Out: 1200 [Urine:1200] Intake/Output this shift: Total I/O In: 360 [P.O.:360] Out: -   General appearance: alert, cooperative and deaf Neck: neck veins bulge Chest wall: no tenderness Cardio: regular rate and rhythm, S1, S2 normal, no murmur, click, rub or gallop  Lab Results:  Recent Labs  12/24/13 0545 12/25/13 0553  WBC 6.1 7.6  HGB 9.6* 8.7*  HCT 28.7* 26.3*  PLT 329 311   BMET:  Recent Labs  12/25/13 0553 12/26/13 0645  NA 141 139  K 4.0 4.1  CL 108 106  CO2 19 19  GLUCOSE 111* 114*  BUN 67* 64*  CREATININE 4.31* 4.30*  CALCIUM 7.5* 7.4*   No results found for this basename: PTH,  in the last 72 hours Iron Studies:  No results found for this basename: IRON, TIBC, TRANSFERRIN, FERRITIN,  in the last 72 hours Studies/Results: Dg Chest Port 1 View  12/26/2013   CLINICAL DATA:  Cough, shortness of breath  EXAM: PORTABLE CHEST - 1 VIEW  COMPARISON:  12/22/2013  FINDINGS: Low lung volumes. Cardiac silhouette is enlarged. There is decreased conspicuity of the perihilar densities. No new focal regions of consolidation or new focal infiltrates. The osseous structures unremarkable.  IMPRESSION: Decreased conspicuity of the perihilar opacities likely reflecting pulmonary edema. No new focal regions of consolidation or new focal infiltrates. Note a component of the decreased conspicuity on the lung findings likely secondary to increased lung volumes when compared to previous study.   Electronically Signed   By: Margaree Mackintosh M.D.   On: 12/26/2013 09:35   Scheduled: . aspirin  325 mg Oral Daily  . azithromycin  500 mg Oral Q24H  . cefTRIAXone (ROCEPHIN)  IV  1 g Intravenous Q24H  . guaiFENesin  1,200 mg Oral BID  . heparin  5,000 Units Subcutaneous Q8H  . hydrALAZINE  25 mg Oral Q8H  . insulin aspart  0-5 Units Subcutaneous QHS  . insulin aspart  0-9 Units Subcutaneous TID WC  . ipratropium  0.5 mg Nebulization Q8H  . levalbuterol  0.63 mg Nebulization Q8H  . metoprolol tartrate  75  mg Oral BID  . sodium chloride  3 mL Intravenous Q12H     LOS: 4 days   Breckin Savannah C 12/26/2013,12:00 PM       Assessment:  1. CKD(creat 1.48 05/06/13) with AKI - Cr trending up, likely acute on chronic, probable contribution of uncontrolled HTN / DM  - otherwise, ?pre-renal; cardiology concerned for amyloidosis but no previous diagnosis  - right kidney listed as 5.2 cm on [place] report, but appears to measure 11.1 cm on images  2. CAP - on Rocephin, azithromycin per primary team  3. HTN - poor control / non-compliance, now on Norvasc and Lopressor, with hydralazine PRN  4. DM - ?diagnosis, has refused treatment in the past but A1c  5.9 on 12/23/13  5. Hb variation - ?chronic anemia, currently likely hemodilutional, some variation 9-12 in the past, as well; no obvious bleed  6. Psychiatric issues - ?psychotic disorder vs ?developmental, psychiatry consult pending  7. Deafness - seems to communicate well with sign interpreter

## 2013-12-26 NOTE — Progress Notes (Signed)
SUBJECTIVE:   She continues to indicate abdominal or suprapubic discomfort.  She denies SOB   PHYSICAL EXAM Filed Vitals:   12/26/13 0933 12/26/13 1104 12/26/13 1335 12/26/13 1356  BP:  170/78 166/76   Pulse:  87 72   Temp:   97.9 F (36.6 C)   TempSrc:   Oral   Resp:   18   Height:      Weight:      SpO2: 99%  100% 99%   General:  No acute distress Lungs:  Diffuse wheezes Heart:  RRR Abdomen:  Positive bowel sounds, no rebound no guarding Extremities:  Moderate edema.   LABS:  Results for orders placed during the hospital encounter of 12/22/13 (from the past 24 hour(s))  GLUCOSE, CAPILLARY     Status: Abnormal   Collection Time    12/25/13  4:42 PM      Result Value Range   Glucose-Capillary 133 (*) 70 - 99 mg/dL  GLUCOSE, CAPILLARY     Status: Abnormal   Collection Time    12/25/13  9:11 PM      Result Value Range   Glucose-Capillary 133 (*) 70 - 99 mg/dL  GLUCOSE, CAPILLARY     Status: Abnormal   Collection Time    12/26/13  6:22 AM      Result Value Range   Glucose-Capillary 101 (*) 70 - 99 mg/dL  RENAL FUNCTION PANEL     Status: Abnormal   Collection Time    12/26/13  6:45 AM      Result Value Range   Sodium 139  137 - 147 mEq/L   Potassium 4.1  3.7 - 5.3 mEq/L   Chloride 106  96 - 112 mEq/L   CO2 19  19 - 32 mEq/L   Glucose, Bld 114 (*) 70 - 99 mg/dL   BUN 64 (*) 6 - 23 mg/dL   Creatinine, Ser 4.30 (*) 0.50 - 1.10 mg/dL   Calcium 7.4 (*) 8.4 - 10.5 mg/dL   Phosphorus 6.0 (*) 2.3 - 4.6 mg/dL   Albumin 1.5 (*) 3.5 - 5.2 g/dL   GFR calc non Af Amer 11 (*) >90 mL/min   GFR calc Af Amer 12 (*) >90 mL/min  BLOOD GAS, ARTERIAL     Status: Abnormal   Collection Time    12/26/13  9:28 AM      Result Value Range   O2 Content 2.5     Delivery systems NASAL CANNULA     pH, Arterial 7.328 (*) 7.350 - 7.450   pCO2 arterial 34.3 (*) 35.0 - 45.0 mmHg   pO2, Arterial 138.0 (*) 80.0 - 100.0 mmHg   Bicarbonate 17.5 (*) 20.0 - 24.0 mEq/L   TCO2 18.6  0 -  100 mmol/L   Acid-base deficit 7.3 (*) 0.0 - 2.0 mmol/L   O2 Saturation 99.2     Patient temperature 98.6     Collection site RIGHT RADIAL     Drawn by [id number]     Sample type ARTERIAL DRAW     Allens test (pass/fail) PASS  PASS  GLUCOSE, CAPILLARY     Status: Abnormal   Collection Time    12/26/13 11:24 AM      Result Value Range   Glucose-Capillary 128 (*) 70 - 99 mg/dL   Comment 1 Notify RN      Intake/Output Summary (Last 24 hours) at 12/26/13 1513 Last data filed at 12/26/13 1300  Gross per 24 hour  Intake 1403.33  ml  Output   1150 ml  Net 253.33 ml    ASSESSMENT AND PLAN:  HTN:  Beta blocker has been increased and hydralazine added.  Med titration per primary team.  SVT:  No further sustained arrhythmia.  She did have 3 beats of a tachy rhythm.  I will discontinue telemetry.    LVH:  Labs ordered as in consult note still pending. Renal does not think she has renal amyloid.  Further evaluation will be challenging with her lack of understanding.  However, I will pursue work up of possible amyloid heart as an outpatient.   Please schedule follow up in our clinic at discharge.  We will see in hospital as neede.   Jeneen Rinks [facility] 12/26/2013 3:13 PM

## 2013-12-27 DIAGNOSIS — N184 Chronic kidney disease, stage 4 (severe): Secondary | ICD-10-CM

## 2013-12-27 DIAGNOSIS — E1129 Type 2 diabetes mellitus with other diabetic kidney complication: Secondary | ICD-10-CM

## 2013-12-27 LAB — CBC
HCT: 24.1 % — ABNORMAL LOW (ref 36.0–46.0)
Hemoglobin: 8 g/dL — ABNORMAL LOW (ref 12.0–15.0)
MCH: 30.2 pg (ref 26.0–34.0)
MCHC: 33.2 g/dL (ref 30.0–36.0)
MCV: 90.9 fL (ref 78.0–100.0)
Platelets: 363 10*3/uL (ref 150–400)
RBC: 2.65 MIL/uL — ABNORMAL LOW (ref 3.87–5.11)
RDW: 13.5 % (ref 11.5–15.5)
WBC: 8.1 10*3/uL (ref 4.0–10.5)

## 2013-12-27 LAB — RENAL FUNCTION PANEL
Albumin: 1.4 g/dL — ABNORMAL LOW (ref 3.5–5.2)
Albumin: 1.4 g/dL — ABNORMAL LOW (ref 3.5–5.2)
BUN: 65 mg/dL — ABNORMAL HIGH (ref 6–23)
BUN: 66 mg/dL — ABNORMAL HIGH (ref 6–23)
CO2: 16 mEq/L — ABNORMAL LOW (ref 19–32)
CO2: 17 mEq/L — ABNORMAL LOW (ref 19–32)
Calcium: 7.3 mg/dL — ABNORMAL LOW (ref 8.4–10.5)
Calcium: 7.4 mg/dL — ABNORMAL LOW (ref 8.4–10.5)
Chloride: 105 mEq/L (ref 96–112)
Chloride: 105 mEq/L (ref 96–112)
Creatinine, Ser: 4.39 mg/dL — ABNORMAL HIGH (ref 0.50–1.10)
Creatinine, Ser: 4.39 mg/dL — ABNORMAL HIGH (ref 0.50–1.10)
GFR calc Af Amer: 12 mL/min — ABNORMAL LOW (ref >90)
GFR calc Af Amer: 12 mL/min — ABNORMAL LOW (ref >90)
GFR calc non Af Amer: 10 mL/min — ABNORMAL LOW (ref >90)
GFR calc non Af Amer: 10 mL/min — ABNORMAL LOW (ref >90)
Glucose, Bld: 95 mg/dL (ref 70–99)
Glucose, Bld: 96 mg/dL (ref 70–99)
Phosphorus: 6.7 mg/dL — ABNORMAL HIGH (ref 2.3–4.6)
Phosphorus: 6.8 mg/dL — ABNORMAL HIGH (ref 2.3–4.6)
Potassium: 4.5 mEq/L (ref 3.7–5.3)
Potassium: 4.5 mEq/L (ref 3.7–5.3)
Sodium: 137 mEq/L (ref 137–147)
Sodium: 137 mEq/L (ref 137–147)

## 2013-12-27 LAB — GLUCOSE, CAPILLARY
Glucose-Capillary: 96 mg/dL (ref 70–99)
Glucose-Capillary: 113 mg/dL — ABNORMAL HIGH (ref 70–99)
Glucose-Capillary: 129 mg/dL — ABNORMAL HIGH (ref 70–99)
Glucose-Capillary: 136 mg/dL — ABNORMAL HIGH (ref 70–99)

## 2013-12-27 MED ORDER — FLUOXETINE HCL 20 MG PO CAPS
20.0000 mg | ORAL_CAPSULE | Freq: Every day | ORAL | Status: DC
  Administered 2013-12-27 – 2014-01-01 (×6): 20 mg via ORAL
  Filled 2013-12-27 (×7): qty 1

## 2013-12-27 MED ORDER — METOPROLOL TARTRATE 100 MG PO TABS
100.0000 mg | ORAL_TABLET | Freq: Two times a day (BID) | ORAL | Status: DC
Start: 2013-12-27 — End: 2014-01-08
  Administered 2013-12-27 – 2014-01-08 (×22): 100 mg via ORAL
  Filled 2013-12-27 (×25): qty 1

## 2013-12-27 MED ORDER — LEVOFLOXACIN 750 MG PO TABS
750.0000 mg | ORAL_TABLET | ORAL | Status: DC
Start: 2013-12-27 — End: 2013-12-30
  Administered 2013-12-27 – 2013-12-29 (×2): 750 mg via ORAL
  Filled 2013-12-27 (×2): qty 1

## 2013-12-27 MED ORDER — FUROSEMIDE 10 MG/ML IJ SOLN
80.0000 mg | Freq: Three times a day (TID) | INTRAMUSCULAR | Status: DC
  Administered 2013-12-27 – 2013-12-29 (×8): 80 mg via INTRAVENOUS
  Filled 2013-12-27 (×10): qty 8

## 2013-12-27 NOTE — Progress Notes (Signed)
1 Chronic Kidney Disease(diabetic nephropathy or hypertensive) with acute component; I don't think she has amyloid kidney  2 Hypertension with non adherence to treatment, BP better  3 Psyche disorder  4 Deaf  5 Volume overload due to nephrosis Plan:   Free light chains and SPEP pending  She will be a poor candidate for any [facility] term treatment if she continues to refuse to take her medications at home.    Certainly, dialysis will be problematic and will need psyche input, competency status, etc.    Add furosemide iv  Subjective: Interval History: none.  Objective: Vital signs in last 24 hours: Temp:  [97.8 F (36.6 C)-98.4 F (36.9 C)] 98.4 F (36.9 C) (01/03 0356) Pulse Rate:  [72-87] 76 (01/03 0356) Resp:  [18-20] 20 (01/03 0356) BP: (165-189)/(70-88) 165/81 mmHg (01/03 0500) SpO2:  [99 %-100 %] 100 % (01/03 0615) Weight:  [108.636 kg (239 lb 8 oz)] 108.636 kg (239 lb 8 oz) (01/03 0356) Weight change: 1.406 kg (3 lb 1.6 oz)  Intake/Output from previous day: 01/02 0701 - 01/03 0700 In: 840 [P.O.:840] Out: 1200 [Urine:1200] Intake/Output this shift: Total I/O In: -  Out: 300 [Urine:300]  General appearance: alert and cooperative; deaf Resp: clear to auscultation bilaterally, audible wheezes Chest wall: no tenderness Extremities: edema 2-3+  Lab Results:  Recent Labs  12/25/13 0553 12/27/13 0420  WBC 7.6 8.1  HGB 8.7* 8.0*  HCT 26.3* 24.1*  PLT 311 363   BMET:  Recent Labs  12/26/13 0645 12/27/13 0420  NA 139 137  137  K 4.1 4.5  4.5  CL 106 105  105  CO2 19 16*  17*  GLUCOSE 114* 96  95  BUN 64* 65*  66*  CREATININE 4.30* 4.39*  4.39*  CALCIUM 7.4* 7.3*  7.4*   No results found for this basename: PTH,  in the last 72 hours Iron Studies: No results found for this basename: IRON, TIBC, TRANSFERRIN, FERRITIN,  in the last 72 hours Studies/Results: Dg Chest Port 1 View  12/26/2013   CLINICAL DATA:  Cough, shortness of breath  EXAM: PORTABLE CHEST  - 1 VIEW  COMPARISON:  12/22/2013  FINDINGS: Low lung volumes. Cardiac silhouette is enlarged. There is decreased conspicuity of the perihilar densities. No new focal regions of consolidation or new focal infiltrates. The osseous structures unremarkable.  IMPRESSION: Decreased conspicuity of the perihilar opacities likely reflecting pulmonary edema. No new focal regions of consolidation or new focal infiltrates. Note a component of the decreased conspicuity on the lung findings likely secondary to increased lung volumes when compared to previous study.   Electronically Signed   By: Margaree Mackintosh M.D.   On: 12/26/2013 09:35   Scheduled: . aspirin  325 mg Oral Daily  . azithromycin  500 mg Oral Q24H  . cefTRIAXone (ROCEPHIN)  IV  1 g Intravenous Q24H  . guaiFENesin  1,200 mg Oral BID  . heparin  5,000 Units Subcutaneous Q8H  . hydrALAZINE  25 mg Oral Q8H  . insulin aspart  0-5 Units Subcutaneous QHS  . insulin aspart  0-9 Units Subcutaneous TID WC  . ipratropium  0.5 mg Nebulization Q8H  . levalbuterol  0.63 mg Nebulization Q8H  . metoprolol tartrate  75 mg Oral BID  . sodium chloride  3 mL Intravenous Q12H     LOS: 5 days   Abisola Carrero C 12/27/2013,6:45 AM

## 2013-12-27 NOTE — Progress Notes (Signed)
Patient receiving instruction on care and and her questions are being answered via use of sign language interpreter.  Appears to have good comprehension of information given via teachback method.  Affect appropriate for situation.  Daughter had called this nurse earlier in the shift expressing concern re:  Her parents being able to function in their current living environment.  She states she is unable to conform to her diet or to understand why she cannot eat certain foods b/c of her renal/diabetes/cardiac status.  Her daughter went on to say she is deeply concerned re:  Her mother not taking her medication appropriately due to comprehension issues.  Will address with care manager.

## 2013-12-27 NOTE — Progress Notes (Signed)
Patient ID: MYLANI BARBUSH, female   DOB: 1956-08-21, 58 y.o.   MRN: [id number]  Psychiatric consultation completed with the help of sign language interpreter. Patient has been feeling depressed, sad, tired and weakness. She has no si/hi, auditory or visual hallucinations or paranoid psychosis. Recommended antidepressant medication fluoxetine for depression.   Kyland No,JANARDHAHA R. 12/27/2013 2:46 PM

## 2013-12-27 NOTE — Progress Notes (Signed)
[facility] PROGRESS NOTE  [name] [id number] DOB: 1956/05/19 DOA: 12/22/2013 PCP: Pcp Not In System Off service summary: 58 y.o.female with history of hypertension, hyperlipidemia, hearing impaired, ? Developmental disability vs psychiatric d/o, anemia, prior HgbA1c of 9.2 in 2012 and heart murmur who presented c/o SOB and racing heart. Sign language interpreter was used to help translate. Patient was a poor historian. She reported that she had been feeling weak since this past summer. However, after Christmas she developed cough, sinus congestion and drainage. She initially went to [facility]. She was found to be tachycardic with heart rate of 170s. She was given PO diltiazem without relief. She was given adenosine and subsequently converted to sinus rhythm. Chest x-ray showed right perihilar airspace disease concerning for pneumonia.  2d echo obtained with findings worrisome for possible amyloid. W/u pending. Pt also has ARF that does not seem to be improved with IVF. Nephrology is consulted and are following. Pt has a remote hx of schizophrenia, currently untreated. Psych was consulted at the request of pt's daughter. Assessment/Plan: Acute respiratory failure with hypoxia/Community acquired pneumonia  Patient with some audible wheezing per nursing. Improving clinically. Patient still with a cough and physical exam with no expiratory wheezing. Change empiric IV Rocephin and azithromycin to oral levaquin. Continue Mucinex to 1200 mg twice a day. Continue scheduled nebulizer treatment. Continue oxygen.  -Flu PCR negative  Tachycardia/SVT??  -resolved and likely physiologic due to hypoxia  -follow on tele  HYPERTENSION, ESSENTIAL, UNCONTROLLED  -issues of noncompliance in past due to reported disbelief that she actually has this diagnosis. Discontinued Norvasc. Increase Lopressor to 100 mg by mouth twice a day. Continue hydralazine 50 mg 3 times a day. Follow Severe concentric  left ventricular hypertrophy/moderate pulmonary HTN/grade 1 diastolic dysfunction  -apparent new findings- no prior ECHO to compare - ?? HOCM  -? of underlying amyloid so Cards consulted - appreciate recs  Chronic bilateral LEG EDEMA  -stable at present. Will saline lock IV fluids. Discontinued Norvasc. Lasix started per renal.  ?? DM type 2  -previous HgA1c was 9.2 in 2012  -this admit 5.9  -pt admitted to significant dietary changes after was initially told she had DM including eliminating exogenous sugar  -at present has diet controlled DM and no indication to begin medication  CKD stage 4  -renal US shows mild medical renal dz and no old Scr to compare  -if concern re: amyloid (see above) may need bx -Nephrology ff -Not improving with hydration Renal function worsening creatinine currently at 4.39 today. NSL  IV fluids. Patient with bilateral lower extremity edema.?? Patient started on diuretics per renal. HYPERLIPIDEMIA  ?? PSYCHIATRIC DISORDER/DEAF  -previously dx'd with psychotic disorder NOS in 2011  -not on meds pre admit  -seems to have a degree of developmental disability and combined with her hearing loss she certainly could develop acute psychotic sx's  -for best interaction with this patient use of the sign language inter[retor is best -Question of decision making capacity. Consult Psychiatry for input -Pt's daughter reports a hx of prior schizophrenia and is concerned that pt is currently not being treated for this (had previously been treated by psych) Psychiatric consultation pending.  Code Status: Full Family Communication: Pt in room (indicate person spoken with, relationship, and if by phone, the number). No family present. Disposition Plan: Pending  Consultants: Cardiology Nephrology  Antibiotics: Zithromax 12/29 >>> 12/27/13 Rocephin 12/29 >>> 12/27/13 levaquin 12/27/13  HPI/Subjective: Complains of blurry vision (in the setting of known  diabetic retinopathy  with planned laser treatment as an outpatient). Patient states feeling a little better. No change in cough.  Objective: Filed Vitals:   12/26/13 2125 12/27/13 0356 12/27/13 0500 12/27/13 0615  BP: 189/88 185/70 165/81   Pulse: 84 76    Temp: 97.8 F (36.6 C) 98.4 F (36.9 C)    TempSrc: Oral Oral    Resp: 20 20    Height:      Weight:  108.636 kg (239 lb 8 oz)    SpO2: 100% 100%  100%    Intake/Output Summary (Last 24 hours) at 12/27/13 1036 Last data filed at 12/27/13 0700  Gross per 24 hour  Intake    840 ml  Output   1400 ml  Net   -560 ml   Filed Weights   12/25/13 0515 12/26/13 0549 12/27/13 0356  Weight: 104.2 kg (229 lb 11.5 oz) 107.23 kg (236 lb 6.4 oz) 108.636 kg (239 lb 8 oz)    Exam:   General:  Awake, in nad  Cardiovascular: Regular, s1, s2  Respiratory: normal resp effort, no wheezing. Coarse BS  Abdomen: soft,nondistended, positive bowel sounds, nontender  Musculoskeletal: perfused, no clubbing. 2-3+ bilateral lower extremity edema  Data Reviewed: Basic Metabolic Panel:  Recent Labs Lab 12/23/13 0425 12/24/13 0545 12/24/13 1315 12/25/13 0553 12/26/13 0645 12/27/13 0420  NA 138 140  --  141 139 137  137  K 3.8 3.9  --  4.0 4.1 4.5  4.5  CL 105 107  --  108 106 105  105  CO2 19 19  --  19 19 16*  17*  GLUCOSE 127* 129*  --  111* 114* 96  95  BUN 59* 63*  --  67* 64* 65*  66*  CREATININE 3.47* 3.99*  --  4.31* 4.30* 4.39*  4.39*  CALCIUM 7.5* 7.5*  --  7.5* 7.4* 7.3*  7.4*  MG  --   --  2.0  --   --   --   PHOS  --   --   --   --  6.0* 6.7*  6.8*   Liver Function Tests:  Recent Labs Lab 12/24/13 1315 12/26/13 0645 12/27/13 0420  AST 25  --   --   ALT 22  --   --   ALKPHOS 99  --   --   BILITOT <0.2*  --   --   PROT 5.8*  --   --   ALBUMIN 1.5* 1.5* 1.4*  1.4*   No results found for this basename: LIPASE, AMYLASE,  in the last 168 hours No results found for this basename: AMMONIA,  in the last 168  hours CBC:  Recent Labs Lab 12/22/13 1908 12/23/13 0425 12/24/13 0545 12/25/13 0553 12/27/13 0420  WBC 5.3 5.9 6.1 7.6 8.1  NEUTROABS 3.3  --   --  3.6  --   HGB 10.8* 9.8* 9.6* 8.7* 8.0*  HCT 32.2* 29.2* 28.7* 26.3* 24.1*  MCV 90.2 89.6 89.4 90.4 90.9  PLT 324 316 329 311 363   Cardiac Enzymes:  Recent Labs Lab 12/22/13 2026 12/23/13 0425 12/23/13 0955  TROPONINI <0.30 <0.30 <0.30   BNP (last 3 results) No results found for this basename: PROBNP,  in the last 8760 hours CBG:  Recent Labs Lab 12/26/13 0622 12/26/13 1124 12/26/13 1652 12/26/13 2114 12/27/13 0609  GLUCAP 101* 128* 106* 125* 96    Recent Results (from the past 240 hour(s))  CULTURE, BLOOD (ROUTINE X 2)  Status: None   Collection Time    12/22/13 10:28 PM      Result Value Range Status   Specimen Description BLOOD RIGHT HAND   Final   Special Requests BOTTLES DRAWN AEROBIC ONLY 3CC   Final   Culture  Setup Time     Final   Value: 2020/08/2913 04:36     Performed at [facility]   Culture     Final   Value:        BLOOD CULTURE RECEIVED NO GROWTH TO DATE CULTURE WILL BE HELD FOR 5 DAYS BEFORE ISSUING A FINAL NEGATIVE REPORT     Performed at [facility]   Report Status PENDING   Incomplete  CULTURE, BLOOD (ROUTINE X 2)     Status: None   Collection Time    12/22/13 10:40 PM      Result Value Range Status   Specimen Description BLOOD LEFT HAND   Final   Special Requests BOTTLES DRAWN AEROBIC ONLY [facility]   Final   Culture  Setup Time     Final   Value: 2020/08/2913 04:36     Performed at [facility]   Culture     Final   Value:        BLOOD CULTURE RECEIVED NO GROWTH TO DATE CULTURE WILL BE HELD FOR 5 DAYS BEFORE ISSUING A FINAL NEGATIVE REPORT     Performed at [facility]   Report Status PENDING   Incomplete  MRSA PCR SCREENING     Status: None   Collection Time    12/23/13  2:24 AM      Result Value Range Status   MRSA by PCR NEGATIVE  NEGATIVE Final    Comment:            The GeneXpert MRSA Assay (FDA     approved for NASAL specimens     only), is one component of a     comprehensive MRSA colonization     surveillance program. It is not     intended to diagnose MRSA     infection nor to guide or     monitor treatment for     MRSA infections.  CULTURE, BLOOD (ROUTINE X 2)     Status: None   Collection Time    12/23/13  4:25 AM      Result Value Range Status   Specimen Description BLOOD RIGHT ANTECUBITAL   Final   Special Requests BOTTLES DRAWN AEROBIC ONLY 5CC   Final   Culture  Setup Time     Final   Value: 2020/08/2913 08:58     Performed at [facility]   Culture     Final   Value:        BLOOD CULTURE RECEIVED NO GROWTH TO DATE CULTURE WILL BE HELD FOR 5 DAYS BEFORE ISSUING A FINAL NEGATIVE REPORT     Performed at [facility]   Report Status PENDING   Incomplete  CULTURE, BLOOD (ROUTINE X 2)     Status: None   Collection Time    12/23/13  4:30 AM      Result Value Range Status   Specimen Description BLOOD RIGHT HAND   Final   Special Requests BOTTLES DRAWN AEROBIC ONLY 3CC   Final   Culture  Setup Time     Final   Value: 2020/08/2913 08:58     Performed at [facility]   Culture     Final  Value:        BLOOD CULTURE RECEIVED NO GROWTH TO DATE CULTURE WILL BE HELD FOR 5 DAYS BEFORE ISSUING A FINAL NEGATIVE REPORT     Performed at [facility]   Report Status PENDING   Incomplete  URINE CULTURE     Status: None   Collection Time    12/23/13 10:11 AM      Result Value Range Status   Specimen Description URINE, RANDOM   Final   Special Requests NONE   Final   Culture  Setup Time     Final   Value: 2020-12-1712 17:57     Performed at [place]     Final   Value: NO GROWTH     Performed at [facility]   Culture     Final   Value: NO GROWTH     Performed at [facility]   Report Status 12/24/2013 FINAL   Final     Studies: Dg Chest Port 1  View  12/26/2013   CLINICAL DATA:  Cough, shortness of breath  EXAM: PORTABLE CHEST - 1 VIEW  COMPARISON:  12/22/2013  FINDINGS: Low lung volumes. Cardiac silhouette is enlarged. There is decreased conspicuity of the perihilar densities. No new focal regions of consolidation or new focal infiltrates. The osseous structures unremarkable.  IMPRESSION: Decreased conspicuity of the perihilar opacities likely reflecting pulmonary edema. No new focal regions of consolidation or new focal infiltrates. Note a component of the decreased conspicuity on the lung findings likely secondary to increased lung volumes when compared to previous study.   Electronically Signed   By: Margaree Mackintosh M.D.   On: 12/26/2013 09:35    Scheduled Meds: . aspirin  325 mg Oral Daily  . azithromycin  500 mg Oral Q24H  . cefTRIAXone (ROCEPHIN)  IV  1 g Intravenous Q24H  . furosemide  80 mg Intravenous TID  . guaiFENesin  1,200 mg Oral BID  . heparin  5,000 Units Subcutaneous Q8H  . hydrALAZINE  25 mg Oral Q8H  . insulin aspart  0-5 Units Subcutaneous QHS  . insulin aspart  0-9 Units Subcutaneous TID WC  . ipratropium  0.5 mg Nebulization Q8H  . levalbuterol  0.63 mg Nebulization Q8H  . metoprolol tartrate  75 mg Oral BID  . sodium chloride  3 mL Intravenous Q12H   Continuous Infusions:    Principal Problem:   Acute respiratory failure with hypoxia Active Problems:   Community acquired pneumonia   HYPERLIPIDEMIA   PSYCHIATRIC DISORDER   DEAF MUTISM   HYPERTENSION, ESSENTIAL, UNCONTROLLED   LEG EDEMA   DM type 2 causing CKD stage 4   Acute renal failure   SVT (supraventricular tachycardia)   NSVT (nonsustained ventricular tachycardia)   Abnormal echocardiogram   Chronic diastolic CHF (congestive heart failure)   Anemia   Constipation  Time spent: 34min  [facility] MD [facility] Pager [phone]. If 7PM-7AM, please contact night-coverage at [email or web address], password [facility] 12/27/2013, 10:36 AM  LOS: 5  days

## 2013-12-28 DIAGNOSIS — F329 Major depressive disorder, single episode, unspecified: Secondary | ICD-10-CM

## 2013-12-28 DIAGNOSIS — F3289 Other specified depressive episodes: Secondary | ICD-10-CM

## 2013-12-28 LAB — RENAL FUNCTION PANEL
Albumin: 1.5 g/dL — ABNORMAL LOW (ref 3.5–5.2)
BUN: 69 mg/dL — ABNORMAL HIGH (ref 6–23)
CO2: 19 mEq/L (ref 19–32)
Calcium: 7.5 mg/dL — ABNORMAL LOW (ref 8.4–10.5)
Chloride: 106 mEq/L (ref 96–112)
Creatinine, Ser: 4.71 mg/dL — ABNORMAL HIGH (ref 0.50–1.10)
GFR calc Af Amer: 11 mL/min — ABNORMAL LOW (ref >90)
GFR calc non Af Amer: 9 mL/min — ABNORMAL LOW (ref >90)
Glucose, Bld: 114 mg/dL — ABNORMAL HIGH (ref 70–99)
Phosphorus: 7.1 mg/dL — ABNORMAL HIGH (ref 2.3–4.6)
Potassium: 4.7 mEq/L (ref 3.7–5.3)
Sodium: 137 mEq/L (ref 137–147)

## 2013-12-28 LAB — GLUCOSE, CAPILLARY
Glucose-Capillary: 110 mg/dL — ABNORMAL HIGH (ref 70–99)
Glucose-Capillary: 102 mg/dL — ABNORMAL HIGH (ref 70–99)
Glucose-Capillary: 98 mg/dL (ref 70–99)
Glucose-Capillary: 114 mg/dL — ABNORMAL HIGH (ref 70–99)

## 2013-12-28 MED ORDER — HYDRALAZINE HCL 50 MG PO TABS
50.0000 mg | ORAL_TABLET | Freq: Three times a day (TID) | ORAL | Status: DC
  Administered 2013-12-28: 50 mg via ORAL
  Filled 2013-12-28 (×3): qty 1

## 2013-12-28 MED ORDER — HYDRALAZINE HCL 25 MG PO TABS
25.0000 mg | ORAL_TABLET | Freq: Once | ORAL | Status: AC
  Administered 2013-12-28: 25 mg via ORAL
  Filled 2013-12-28: qty 1

## 2013-12-28 MED ORDER — HYDRALAZINE HCL 50 MG PO TABS
75.0000 mg | ORAL_TABLET | Freq: Three times a day (TID) | ORAL | Status: DC
  Administered 2013-12-28 – 2013-12-29 (×3): 75 mg via ORAL
  Filled 2013-12-28 (×5): qty 1

## 2013-12-28 NOTE — Clinical Social Work Psych Assess (Deleted)
     Clinical Social Work Department CLINICAL SOCIAL WORK PSYCHIATRY SERVICE LINE ASSESSMENT 12/28/2013  Patient:  [name]  Account:  [id number]  Admit Date:  12/22/2013  Clinical Social Worker:  Chandler Stofer, LCSWA  Date/Time:    Referred by:    Date referred:    Presenting Symptoms/Problems (In the persons/familys own words):    Abuse/Neglect/Trauma Comments:    Psychiatric medications:  Current Mental Health Hospitalizations/Previous Mental Health History:   Current provider:   Place and Date:   Current Medications:   Previous Impatient Admission/Date/Reason:   Emotional Health / Current Symptoms     Suicide attempt in the past:   Other harmful behavior:    Other Psychotic/Dissociative Symptoms:     Other Attention / Behavioral Symptoms:     Other Cognitive Impairment:       Anxiety (frequency):   Phobia (specify):   Compulsive behavior (specify):   Obsessive behavior (specify):   Other:    SBIRT completed (please refer for detailed history):    Self-reported substance use:   Urinary Drug Screen Completed:   Alcohol level:     Who is in the home:   Emergency contact:   Patients Strengths and Goals (patients own words):   Clinical Social Workers Interpretive Summary:   Disposition:

## 2013-12-28 NOTE — Progress Notes (Signed)
B.P. 177/93 P.-76 Apresoline 10 mg I.V. given

## 2013-12-28 NOTE — Progress Notes (Signed)
CSW met with patient and Khristin Keleher, Interpreter- Communication Services this afternoon in attempt to complete CSW Psych assessment.  Communicating with patient was noted to be somewhat challenging- she does not see well and was unable to cognitively understand much of what was being asked of her. Patient related that she lives with her husband who is also deaf and apparantly unable to provide care for her or for himself.  Patient was unable to complete answers needed for the assessment.  Will discuss with Nonnie Done, [place] in the a.m.  Per the interpreter, patient is agreeable to SNF placement as she does not appear to be able to thrive at home. Unfortunatlely, communication in the SNF will be very limited due to her limitations in communication. Lorie Phenix. [place], [place]

## 2013-12-28 NOTE — Progress Notes (Signed)
[facility] PROGRESS NOTE  [name] [id number] DOB: [birth year] DOA: 12/22/2013 PCP: Pcp Not In System Off service summary: 58 y.o.female with history of hypertension, hyperlipidemia, hearing impaired, ? Developmental disability vs psychiatric d/o, anemia, prior HgbA1c of 9.2 in 2012 and heart murmur who presented c/o SOB and racing heart. Sign language interpreter was used to help translate. Patient was a poor historian. She reported that she had been feeling weak since this past summer. However, after Christmas she developed cough, sinus congestion and drainage. She initially went to [facility]. She was found to be tachycardic with heart rate of 170s. She was given PO diltiazem without relief. She was given adenosine and subsequently converted to sinus rhythm. Chest x-ray showed right perihilar airspace disease concerning for pneumonia.  2d echo obtained with findings worrisome for possible amyloid. W/u pending. Pt also has ARF that does not seem to be improved with IVF. Nephrology is consulted and are following. Pt has a remote hx of schizophrenia, currently untreated. Psych was consulted at the request of pt's daughter. Assessment/Plan: Acute respiratory failure with hypoxia/Community acquired pneumonia  Patient with some audible wheezing per nursing. Improving clinically. Patient still with a cough and physical exam with no expiratory wheezing. Continue oral levaquin. Continue Mucinex  1200 mg twice a day. Continue scheduled nebulizer treatment. Continue oxygen.  -Flu PCR negative  Tachycardia/SVT??  -resolved and likely physiologic due to hypoxia  -follow on tele  HYPERTENSION, ESSENTIAL, UNCONTROLLED  -issues of noncompliance in past due to reported disbelief that she actually has this diagnosis. Discontinued Norvasc. Increased Lopressor to 100 mg by mouth twice a day. Increase hydralazine 75 mg 3 times a day. Currently on Lasix. Follow Severe concentric left ventricular  hypertrophy/moderate pulmonary HTN/grade 1 diastolic dysfunction  -apparent new findings- no prior ECHO to compare - ?? HOCM  -? of underlying amyloid so Cards consulted - appreciate recs  Chronic bilateral LEG EDEMA  -stable at present. Will saline lock IV fluids. Discontinued Norvasc. Lasix started per renal.  ?? DM type 2  -previous HgA1c was 9.2 in 2012  -this admit 5.9  -pt admitted to significant dietary changes after was initially told she had DM including eliminating exogenous sugar  -at present has diet controlled DM and no indication to begin medication  CKD stage 4  -renal US shows mild medical renal dz and no old Scr to compare  -if concern re: amyloid (see above) may need bx. Nephrology doubts if patient has amyloid. -Nephrology ff -Not improving with hydration Renal function worsening creatinine currently at 4.71 today. NSL  IV fluids. Patient with bilateral lower extremity edema.  Patient started on diuretics per renal. HYPERLIPIDEMIA  ?? PSYCHIATRIC DISORDER/DEAF/Depression -previously dx'd with psychotic disorder NOS in 2011  -not on meds pre admit  -seems to have a degree of developmental disability and combined with her hearing loss she certainly could develop acute psychotic sx's  -for best interaction with this patient use of the sign language inter[retor is best -Question of decision making capacity. Patient has been seen by psychiatry and patient has been started on Prozac for depression. -Pt's daughter reports a hx of prior schizophrenia and is concerned that pt is currently not being treated for this (had previously been treated by psych)  Code Status: Full Family Communication: Pt in room (indicate person spoken with, relationship, and if by phone, the number). No family present. Disposition Plan: Pending  Consultants: Cardiology Nephrology Psychiatry  Antibiotics: Zithromax 12/29 >>> 12/27/13 Rocephin 12/29 >>> 12/27/13 levaquin  12/27/13  HPI/Subjective: Complains of blurry vision (in the setting of known diabetic retinopathy with planned laser treatment as an outpatient). Patient states feeling a little better. No change in cough. Patient c/o weakness.  Objective: Filed Vitals:   12/27/13 2016 12/28/13 0022 12/28/13 0228 12/28/13 0456  BP: 216/98 175/92 177/93 170/79  Pulse: 78 72 76 76  Temp: 97.6 F (36.4 C)   98.2 F (36.8 C)  TempSrc: Oral   Oral  Resp: 18   18  Height:      Weight:    107.593 kg (237 lb 3.2 oz)  SpO2: 99%   99%    Intake/Output Summary (Last 24 hours) at 12/28/13 1128 Last data filed at 12/28/13 1000  Gross per 24 hour  Intake    840 ml  Output   3400 ml  Net  -2560 ml   Filed Weights   12/26/13 0549 12/27/13 0356 12/28/13 0456  Weight: 107.23 kg (236 lb 6.4 oz) 108.636 kg (239 lb 8 oz) 107.593 kg (237 lb 3.2 oz)    Exam:   General:  Awake, in nad  Cardiovascular: Regular, s1, s2  Respiratory: normal resp effort, no wheezing. Coarse BS  Abdomen: soft,nondistended, positive bowel sounds, nontender  Musculoskeletal: perfused, no clubbing. 2+ bilateral lower extremity edema  Data Reviewed: Basic Metabolic Panel:  Recent Labs Lab 12/24/13 0545 12/24/13 1315 12/25/13 0553 12/26/13 0645 12/27/13 0420 12/28/13 0720  NA 140  --  141 139 137  137 137  K 3.9  --  4.0 4.1 4.5  4.5 4.7  CL 107  --  108 106 105  105 106  CO2 19  --  19 19 16*  17* 19  GLUCOSE 129*  --  111* 114* 96  95 114*  BUN 63*  --  67* 64* 65*  66* 69*  CREATININE 3.99*  --  4.31* 4.30* 4.39*  4.39* 4.71*  CALCIUM 7.5*  --  7.5* 7.4* 7.3*  7.4* 7.5*  MG  --  2.0  --   --   --   --   PHOS  --   --   --  6.0* 6.7*  6.8* 7.1*   Liver Function Tests:  Recent Labs Lab 12/24/13 1315 12/26/13 0645 12/27/13 0420 12/28/13 0720  AST 25  --   --   --   ALT 22  --   --   --   ALKPHOS 99  --   --   --   BILITOT <0.2*  --   --   --   PROT 5.8*  --   --   --   ALBUMIN 1.5* 1.5* 1.4*   1.4* 1.5*   No results found for this basename: LIPASE, AMYLASE,  in the last 168 hours No results found for this basename: AMMONIA,  in the last 168 hours CBC:  Recent Labs Lab 12/22/13 1908 12/23/13 0425 12/24/13 0545 12/25/13 0553 12/27/13 0420  WBC 5.3 5.9 6.1 7.6 8.1  NEUTROABS 3.3  --   --  3.6  --   HGB 10.8* 9.8* 9.6* 8.7* 8.0*  HCT 32.2* 29.2* 28.7* 26.3* 24.1*  MCV 90.2 89.6 89.4 90.4 90.9  PLT 324 316 329 311 363   Cardiac Enzymes:  Recent Labs Lab 12/22/13 2026 12/23/13 0425 12/23/13 0955  TROPONINI <0.30 <0.30 <0.30   BNP (last 3 results) No results found for this basename: PROBNP,  in the last 8760 hours CBG:  Recent Labs Lab 12/27/13 0609 12/27/13 1124 12/27/13 1619  12/27/13 2048 12/28/13 0609  GLUCAP 96 113* 129* 136* 110*    Recent Results (from the past 240 hour(s))  CULTURE, BLOOD (ROUTINE X 2)     Status: None   Collection Time    12/22/13 10:28 PM      Result Value Range Status   Specimen Description BLOOD RIGHT HAND   Final   Special Requests BOTTLES DRAWN AEROBIC ONLY 3CC   Final   Culture  Setup Time     Final   Value: 2020/11/2513 04:36     Performed at [facility]   Culture     Final   Value:        BLOOD CULTURE RECEIVED NO GROWTH TO DATE CULTURE WILL BE HELD FOR 5 DAYS BEFORE ISSUING A FINAL NEGATIVE REPORT     Performed at [facility]   Report Status PENDING   Incomplete  CULTURE, BLOOD (ROUTINE X 2)     Status: None   Collection Time    12/22/13 10:40 PM      Result Value Range Status   Specimen Description BLOOD LEFT HAND   Final   Special Requests BOTTLES DRAWN AEROBIC ONLY [facility]   Final   Culture  Setup Time     Final   Value: 2020/11/2513 04:36     Performed at [facility]   Culture     Final   Value:        BLOOD CULTURE RECEIVED NO GROWTH TO DATE CULTURE WILL BE HELD FOR 5 DAYS BEFORE ISSUING A FINAL NEGATIVE REPORT     Performed at [facility]   Report Status PENDING   Incomplete   MRSA PCR SCREENING     Status: None   Collection Time    12/23/13  2:24 AM      Result Value Range Status   MRSA by PCR NEGATIVE  NEGATIVE Final   Comment:            The GeneXpert MRSA Assay (FDA     approved for NASAL specimens     only), is one component of a     comprehensive MRSA colonization     surveillance program. It is not     intended to diagnose MRSA     infection nor to guide or     monitor treatment for     MRSA infections.  CULTURE, BLOOD (ROUTINE X 2)     Status: None   Collection Time    12/23/13  4:25 AM      Result Value Range Status   Specimen Description BLOOD RIGHT ANTECUBITAL   Final   Special Requests BOTTLES DRAWN AEROBIC ONLY 5CC   Final   Culture  Setup Time     Final   Value: 2020/11/2513 08:58     Performed at [facility]   Culture     Final   Value:        BLOOD CULTURE RECEIVED NO GROWTH TO DATE CULTURE WILL BE HELD FOR 5 DAYS BEFORE ISSUING A FINAL NEGATIVE REPORT     Performed at [facility]   Report Status PENDING   Incomplete  CULTURE, BLOOD (ROUTINE X 2)     Status: None   Collection Time    12/23/13  4:30 AM      Result Value Range Status   Specimen Description BLOOD RIGHT HAND   Final   Special Requests BOTTLES DRAWN AEROBIC ONLY 3CC   Final  Culture  Setup Time     Final   Value: Nov 22, 202014 08:58     Performed at [facility]   Culture     Final   Value:        BLOOD CULTURE RECEIVED NO GROWTH TO DATE CULTURE WILL BE HELD FOR 5 DAYS BEFORE ISSUING A FINAL NEGATIVE REPORT     Performed at [facility]   Report Status PENDING   Incomplete  URINE CULTURE     Status: None   Collection Time    12/23/13 10:11 AM      Result Value Range Status   Specimen Description URINE, RANDOM   Final   Special Requests NONE   Final   Culture  Setup Time     Final   Value: Nov 22, 202014 17:57     Performed at [place]     Final   Value: NO GROWTH     Performed at [facility]    Culture     Final   Value: NO GROWTH     Performed at [facility]   Report Status 12/24/2013 FINAL   Final     Studies: No results found.  Scheduled Meds: . aspirin  325 mg Oral Daily  . FLUoxetine  20 mg Oral Daily  . furosemide  80 mg Intravenous TID  . guaiFENesin  1,200 mg Oral BID  . heparin  5,000 Units Subcutaneous Q8H  . hydrALAZINE  50 mg Oral Q8H  . insulin aspart  0-5 Units Subcutaneous QHS  . insulin aspart  0-9 Units Subcutaneous TID WC  . ipratropium  0.5 mg Nebulization Q8H  . levalbuterol  0.63 mg Nebulization Q8H  . levofloxacin  750 mg Oral Q48H  . metoprolol tartrate  100 mg Oral BID  . sodium chloride  3 mL Intravenous Q12H   Continuous Infusions:    Principal Problem:   Acute respiratory failure with hypoxia Active Problems:   Community acquired pneumonia   HYPERLIPIDEMIA   PSYCHIATRIC DISORDER   DEAF MUTISM   HYPERTENSION, ESSENTIAL, UNCONTROLLED   LEG EDEMA   DM type 2 causing CKD stage 4   Acute renal failure   SVT (supraventricular tachycardia)   NSVT (nonsustained ventricular tachycardia)   Abnormal echocardiogram   Chronic diastolic CHF (congestive heart failure)   Anemia   Constipation  Time spent: 48min  [facility] MD [facility] Pager [phone]. If 7PM-7AM, please contact night-coverage at [email or web address], password [facility] 12/28/2013, 11:28 AM  LOS: 6 days

## 2013-12-28 NOTE — Progress Notes (Signed)
B.P. 175/92  P.-72

## 2013-12-28 NOTE — Progress Notes (Signed)
   CARE MANAGEMENT NOTE 12/28/2013  Patient:  [name], [name]   Account Number:  [id number]  Date Initiated:  2020-11-913  Documentation initiated by:  Elissa Hefty  Subjective/Objective Assessment:   adm w htn, svt, pna     Action/Plan:   lives w husband, pt is deaf. pt has da also.  Husband is also deaf.   Anticipated DC Date:  12/29/2013   Anticipated DC Plan:  [name]  CM consult      Choice offered to / List presented to:             Status of service:  In process, will continue to follow Medicare Important Message given?   (If response is "NO", the following Medicare IM given date fields will be blank) Date Medicare IM given:   Date Additional Medicare IM given:    Discharge Disposition:    Per UR Regulation:  Reviewed for med. necessity/level of care/duration of stay  If discussed at [place] of Stay Meetings, dates discussed:    Comments:  12/28/13 16:55 CM spoke with pt's son, [name], [phone] concerning home health.  [name] was very concerned about him mother going home as he works full time and asks to speak with a Education officer, museum to discuss alternative living situations for his Mother and eventually father.  CM made referral to CSW with Maurice's permission and gratitude.  [name] understands this referral will not be addressed until tomorrow.  CSW states she will pass it on to weekday CSW.  Will cointinue to follow for disposition.  [name], BSN, [place] [phone]. 12/28/13 08:30 CM met with pt in room and [name], sign language interpreter to discuss [facility] with pt.  Pt was not cooperative enough for this CM to feel comfortable in arranging home health services at this time.  Pt to have SW consult and will continue to follow for discharge needs. [name], BSN, [place]  [phone].  12/26/13 [name] AMERSON,RN,BSN [phone] PSYCH CONSULT PENDING; REQUESTED BY DAUGHTER, AS PT HAS HX OF SCHIZOPHRENIA, NOT TREATED.  WILL  FOLLOW.

## 2013-12-28 NOTE — Progress Notes (Signed)
B.P. 216/98 P.-78  Apresoline 10 mg I.V. given

## 2013-12-28 NOTE — Progress Notes (Signed)
1 Chronic Kidney Disease(diabetic nephropathy or hypertensive) with acute component; I don't think she has amyloid kidney  2 Hypertension with non adherence to treatment, sl BP better  3 Psyche disorder & Depression  4 Deaf  5 Volume overload due to nephrosis  Plan: Free light chains and SPEP pending   She will be a poor candidate for any [facility] term treatment if she continues to refuse to take her medications at home.   Pyche eval noted. Depression, but no mention of competency.  Continue furosemide iv   Objective: Vital signs in last 24 hours: Temp:  [97.6 F (36.4 C)-98.2 F (36.8 C)] 98.2 F (36.8 C) (01/04 0456) Pulse Rate:  [72-86] 76 (01/04 0456) Resp:  [18] 18 (01/04 0456) BP: (157-216)/(73-98) 170/79 mmHg (01/04 0456) SpO2:  [99 %-100 %] 99 % (01/04 0456) Weight:  [107.593 kg (237 lb 3.2 oz)] 107.593 kg (237 lb 3.2 oz) (01/04 0456) Weight change: -1.043 kg (-2 lb 4.8 oz)  Intake/Output from previous day: 01/03 0701 - 01/04 0700 In: 840 [P.O.:840] Out: 3400 [Urine:3400] Intake/Output this shift: Total I/O In: 120 [P.O.:120] Out: 1600 [Urine:1600]  Ears: deaf Extremities: edema 2-3+  Lab Results:  Recent Labs  12/27/13 0420  WBC 8.1  HGB 8.0*  HCT 24.1*  PLT 363   BMET:  Recent Labs  12/26/13 0645 12/27/13 0420  NA 139 137  137  K 4.1 4.5  4.5  CL 106 105  105  CO2 19 16*  17*  GLUCOSE 114* 96  95  BUN 64* 65*  66*  CREATININE 4.30* 4.39*  4.39*  CALCIUM 7.4* 7.3*  7.4*   No results found for this basename: PTH,  in the last 72 hours Iron Studies: No results found for this basename: IRON, TIBC, TRANSFERRIN, FERRITIN,  in the last 72 hours Studies/Results: Dg Chest Port 1 View  12/26/2013   CLINICAL DATA:  Cough, shortness of breath  EXAM: PORTABLE CHEST - 1 VIEW  COMPARISON:  12/22/2013  FINDINGS: Low lung volumes. Cardiac silhouette is enlarged. There is decreased conspicuity of the perihilar densities. No new focal regions of consolidation  or new focal infiltrates. The osseous structures unremarkable.  IMPRESSION: Decreased conspicuity of the perihilar opacities likely reflecting pulmonary edema. No new focal regions of consolidation or new focal infiltrates. Note a component of the decreased conspicuity on the lung findings likely secondary to increased lung volumes when compared to previous study.   Electronically Signed   By: Margaree Mackintosh M.D.   On: 12/26/2013 09:35   Scheduled: . aspirin  325 mg Oral Daily  . FLUoxetine  20 mg Oral Daily  . furosemide  80 mg Intravenous TID  . guaiFENesin  1,200 mg Oral BID  . heparin  5,000 Units Subcutaneous Q8H  . hydrALAZINE  25 mg Oral Q8H  . insulin aspart  0-5 Units Subcutaneous QHS  . insulin aspart  0-9 Units Subcutaneous TID WC  . ipratropium  0.5 mg Nebulization Q8H  . levalbuterol  0.63 mg Nebulization Q8H  . levofloxacin  750 mg Oral Q48H  . metoprolol tartrate  100 mg Oral BID  . sodium chloride  3 mL Intravenous Q12H     LOS: 6 days   Lebron Nauert C 12/28/2013,6:50 AM

## 2013-12-29 LAB — PROTEIN ELECTROPHORESIS, SERUM
Albumin ELP: 41.8 % — ABNORMAL LOW (ref 55.8–66.1)
Alpha-1-Globulin: 7.7 % — ABNORMAL HIGH (ref 2.9–4.9)
Alpha-2-Globulin: 21.2 % — ABNORMAL HIGH (ref 7.1–11.8)
Beta 2: 8.6 % — ABNORMAL HIGH (ref 3.2–6.5)
Beta Globulin: 5.8 % (ref 4.7–7.2)
Gamma Globulin: 14.9 % (ref 11.1–18.8)
M-Spike, %: NOT DETECTED g/dL
Total Protein ELP: 5.3 g/dL — ABNORMAL LOW (ref 6.0–8.3)

## 2013-12-29 LAB — IRON AND TIBC
Iron: 45 ug/dL (ref 42–135)
Saturation Ratios: 25 % (ref 20–55)
TIBC: 181 ug/dL — ABNORMAL LOW (ref 250–470)
UIBC: 136 ug/dL (ref 125–400)

## 2013-12-29 LAB — CULTURE, BLOOD (ROUTINE X 2)
Culture: NO GROWTH
Culture: NO GROWTH
Culture: NO GROWTH
Culture: NO GROWTH

## 2013-12-29 LAB — RENAL FUNCTION PANEL
Albumin: 1.5 g/dL — ABNORMAL LOW (ref 3.5–5.2)
BUN: 72 mg/dL — ABNORMAL HIGH (ref 6–23)
CO2: 19 mEq/L (ref 19–32)
Calcium: 7.9 mg/dL — ABNORMAL LOW (ref 8.4–10.5)
Chloride: 104 mEq/L (ref 96–112)
Creatinine, Ser: 4.77 mg/dL — ABNORMAL HIGH (ref 0.50–1.10)
GFR calc Af Amer: 11 mL/min — ABNORMAL LOW (ref >90)
GFR calc non Af Amer: 9 mL/min — ABNORMAL LOW (ref >90)
Glucose, Bld: 119 mg/dL — ABNORMAL HIGH (ref 70–99)
Phosphorus: 7.4 mg/dL — ABNORMAL HIGH (ref 2.3–4.6)
Potassium: 4.5 mEq/L (ref 3.7–5.3)
Sodium: 138 mEq/L (ref 137–147)

## 2013-12-29 LAB — GLUCOSE, CAPILLARY
Glucose-Capillary: 106 mg/dL — ABNORMAL HIGH (ref 70–99)
Glucose-Capillary: 131 mg/dL — ABNORMAL HIGH (ref 70–99)
Glucose-Capillary: 132 mg/dL — ABNORMAL HIGH (ref 70–99)
Glucose-Capillary: 124 mg/dL — ABNORMAL HIGH (ref 70–99)

## 2013-12-29 LAB — KAPPA/LAMBDA LIGHT CHAINS
Kappa free light chain: 27.9 mg/dL — ABNORMAL HIGH (ref 0.33–1.94)
Kappa, lambda light chain ratio: 1.51 (ref 0.26–1.65)
Lambda free light chains: 18.5 mg/dL — ABNORMAL HIGH (ref 0.57–2.63)

## 2013-12-29 LAB — FERRITIN: Ferritin: 675 ng/mL — ABNORMAL HIGH (ref 10–291)

## 2013-12-29 MED ORDER — CALCIUM ACETATE 667 MG PO CAPS
667.0000 mg | ORAL_CAPSULE | Freq: Three times a day (TID) | ORAL | Status: DC
  Administered 2013-12-29 – 2014-01-08 (×27): 667 mg via ORAL
  Filled 2013-12-29 (×32): qty 1

## 2013-12-29 MED ORDER — FUROSEMIDE 10 MG/ML IJ SOLN
160.0000 mg | Freq: Two times a day (BID) | INTRAVENOUS | Status: DC
  Administered 2013-12-29 – 2013-12-30 (×2): 160 mg via INTRAVENOUS
  Filled 2013-12-29 (×3): qty 16

## 2013-12-29 MED ORDER — HYDRALAZINE HCL 50 MG PO TABS
100.0000 mg | ORAL_TABLET | Freq: Three times a day (TID) | ORAL | Status: DC
  Administered 2013-12-29 – 2013-12-30 (×3): 100 mg via ORAL
  Filled 2013-12-29 (×5): qty 2

## 2013-12-29 NOTE — Evaluation (Signed)
Occupational Therapy Evaluation Patient Details Name: [name] MRN: [id number] DOB: 05-12-1956 Today's Date: 12/29/2013 Time: [id number] OT Time Calculation (min): 15 min  OT Assessment / Plan / Recommendation History of present illness [name] is a 58 y.o. 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  Sign interpreter was used to help translate.  Patient is a poor historian.  She reports that she has been feeling weak since the summer.  However, after Christmas she developed cough, sinus congestion and drainage.  She initially went to [facility].  She was found to be tachycardic with heart rate of 170s.  She was given diltiazem without relief.  She was given adenosine which broke her out of the rhythm to a sinus rhythm.  Chest x-ray showed right perihilar airspace disease concerning for pneumonia.  Hospitalist service was asked to admit the patient for further care and management.  Currently feels better, complaining of generalized weakness.  Felt nauseated earlier today but has not vomited.  Currently denies any chest pain.  Denies any abdominal pain or diarrhea.   Clinical Impression   Pt demos decline in function with ADLs and ADL mobility safety and would benefit from acute OT services to address impairments to increase level of function and safety. Pt pleasant and cooperative. Pt requires sign language interpreter, but is able to follow written instructions in large black print    OT Assessment  Patient needs continued OT Services    Follow Up Recommendations  SNF    Barriers to Discharge Decreased caregiver support    Equipment Recommendations   TBD at next venue of care   Recommendations for Other Services    Frequency  Min 2X/week    Precautions / Restrictions Precautions Precautions:  Fall Restrictions Weight Bearing Restrictions: No   Pertinent Vitals/Pain No c/o pain    ADL  Grooming: Performed;Wash/dry hands;Wash/dry face;Min guard Where Assessed - Grooming: Supported standing Upper Body Bathing: Simulated;Supervision/safety;Set up;Min guard Where Assessed - Upper Body Bathing: Unsupported sitting;Supported standing Lower Body Bathing: Simulated;Min guard;Minimal assistance Upper Body Dressing: Performed;Supervision/safety;Set up;Min guard Where Assessed - Upper Body Dressing: Unsupported sitting;Supported standing Lower Body Dressing: Performed;Min guard;Minimal assistance Toilet Transfer: Performed;Minimal assistance Toilet Transfer Method: Sit to stand Toilet Transfer Equipment: Regular height toilet;Grab bars Toileting - Clothing Manipulation and Hygiene: Performed;Min guard;Minimal assistance Where Assessed - Best boy and Hygiene: Standing Tub/Shower Transfer Method: Not assessed Transfers/Ambulation Related to ADLs: physical/tactile cues, guiding for hand placement ADL Comments: Pt requires min guard A- min A for ADL mobility. Higer level ADLs unsafe    OT Diagnosis: Generalized weakness  OT Problem List: Decreased strength;Decreased activity tolerance;Decreased knowledge of use of DME or AE;Impaired balance (sitting and/or standing) OT Treatment Interventions: Self-care/ADL training;Neuromuscular education;DME and/or AE instruction;Therapeutic activities;Balance training;Therapeutic exercise;Patient/family education   OT Goals(Current goals can be found in the care plan section) Acute Rehab OT Goals Patient Stated Goal: none  OT Goal Formulation: With patient Time For Goal Achievement: 01/05/14 Potential to Achieve Goals: Good ADL Goals Pt Will Perform Grooming: with set-up;with supervision;standing Pt Will Perform Upper Body Bathing: with set-up;with supervision;standing Pt Will Perform Lower Body Bathing: with min guard  assist;with supervision;with set-up;sit to/from stand Pt Will Perform Upper Body Dressing: with supervision;with set-up;standing Pt Will Perform Lower Body Dressing: with min guard assist;with supervision;with set-up;sit to/from stand Pt Will Transfer to Toilet: with min guard assist;with supervision;regular  height toilet;grab bars Pt Will Perform Toileting - Clothing Manipulation and hygiene: with supervision;sit to/from stand  Visit Information  Last OT Received On: 12/29/13 Assistance Needed: +1 History of Present Illness: [name] is a 58 y.o. 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  Sign interpreter was used to help translate.  Patient is a poor historian.  She reports that she has been feeling weak since the summer.  However, after Christmas she developed cough, sinus congestion and drainage.  She initially went to [facility].  She was found to be tachycardic with heart rate of 170s.  She was given diltiazem without relief.  She was given adenosine which broke her out of the rhythm to a sinus rhythm.  Chest x-ray showed right perihilar airspace disease concerning for pneumonia.  Hospitalist service was asked to admit the patient for further care and management.  Currently feels better, complaining of generalized weakness.  Felt nauseated earlier today but has not vomited.  Currently denies any chest pain.  Denies any abdominal pain or diarrhea.       Prior [place] expects to be discharged to:: Private residence Type of Home: Apartment Home Access: Stairs to enter Entrance Stairs-Rails: Right;Left Home Layout: One level Home Equipment: None Prior Function Level of Independence: Independent Communication Communication: Deaf;Other (comment) (can read large print  (white paper/black marker)) Dominant Hand: Right         Vision/Perception Vision - History Baseline Vision: Other (comment) (decrased vision) Patient Visual Report: No change from baseline Perception Perception: Impaired   Cognition  Cognition Arousal/Alertness: Awake/alert Behavior During Therapy: Flat affect Overall Cognitive Status: Within Functional Limits for tasks assessed    Extremity/Trunk Assessment Upper Extremity Assessment Upper Extremity Assessment: Overall WFL for tasks assessed;Generalized weakness Lower Extremity Assessment Lower Extremity Assessment: Defer to PT evaluation Cervical / Trunk Assessment Cervical / Trunk Assessment: Normal     Mobility Bed Mobility Bed Mobility: Not assessed Transfers Sit to Stand: With upper extremity assist;From chair/3-in-1;4: Min assist;From toilet Stand to Sit: With upper extremity assist;To chair/3-in-1;To toilet;4: Min assist Details for Transfer Assistance: physical/tactile cues, guiding for hand placement     Exercise     Balance Balance Balance Assessed: Yes Dynamic Sitting Balance Dynamic Sitting - Balance Support: No upper extremity supported;Feet supported Dynamic Sitting - Level of Assistance: 5: Stand by assistance Static Standing Balance Static Standing - Balance Support: Right upper extremity supported;Left upper extremity supported Static Standing - Level of Assistance: 5: Stand by assistance Static Standing - Comment/# of Minutes: could stand in RW and sign with one or two hands at times without stability assist Dynamic Standing Balance Dynamic Standing - Balance Support: Right upper extremity supported;Left upper extremity supported;During functional activity Dynamic Standing - Level of Assistance: 4: Min assist;Other (comment) (min guard A)   End of Session OT - End of Session Equipment Utilized During Treatment: Rolling walker Activity Tolerance: Patient limited by fatigue Patient left: in  chair;with call bell/phone within reach  GO     Britt Bottom 12/29/2013, 1:09 PM

## 2013-12-29 NOTE — Progress Notes (Signed)
Patient ID: [name], female   DOB: 10/14/1956, 58 y.o.   MRN: [id number]  Patient was assessed for this psychiatric evaluation regarding depression and capacity evaluation. Patient has started antidepressant medication for depression and it is determined that patient has  capacity to make her own medical decisions and living arrangements. May contact at [phone] if needed further assistance   Gwendelyn Lanting,JANARDHAHA R. 12/29/2013 2:17 PM

## 2013-12-29 NOTE — Progress Notes (Signed)
Clinical Social Work Department CLINICAL SOCIAL WORK PLACEMENT NOTE 12/29/2013  Patient:  [name], [name]  Account Number:  [id number] Admit date:  12/22/2013  Clinical Social Worker:  Megan Salon  Date/time:  12/29/2013 12:16 PM  Clinical Social Work is seeking post-discharge placement for this patient at the following level of care:   [place]   (*CSW will update this form in Epic as items are completed)   12/29/2013  Patient/family provided with [facility] Department of Clinical Social Work's list of facilities offering this level of care within the geographic area requested by the patient (or if unable, by the patient's family).  12/29/2013  Patient/family informed of their freedom to choose among providers that offer the needed level of care, that participate in Medicare, Medicaid or managed care program needed by the patient, have an available bed and are willing to accept the patient.  12/29/2013  Patient/family informed of [facility]' ownership interest in [facility], as well as of the fact that they are under no obligation to receive care at this facility.  PASARR submitted to EDS on 12/29/2013 PASARR number received from EDS on 12/29/2013  FL2 transmitted to all facilities in geographic area requested by pt/family on  12/29/2013 FL2 transmitted to all facilities within larger geographic area on   Patient informed that his/her managed care company has contracts with or will negotiate with  certain facilities, including the following:     Patient/family informed of bed offers received:   Patient chooses bed at  Physician recommends and patient chooses bed at    Patient to be transferred to  on   Patient to be transferred to facility by   The following physician request were entered in Epic:   Additional Comments:  Jeanette Caprice, MSW, [place]

## 2013-12-29 NOTE — Progress Notes (Signed)
B.P. 184/75 P. 67 Hydralazine 10 mg I.V. given

## 2013-12-29 NOTE — Progress Notes (Signed)
Physical Therapy Treatment Patient Details Name: [name] MRN: [id number] DOB: Oct 31, 1956 Today's Date: 12/29/2013 Time: [id number] PT Time Calculation (min): 27 min  PT Assessment / Plan / Recommendation  History of Present Illness [name] is a 58 y.o. 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  Sign interpreter was used to help translate.  Patient is a poor historian.  She reports that she has been feeling weak since the summer.  However, after Christmas she developed cough, sinus congestion and drainage.  She initially went to [facility].  She was found to be tachycardic with heart rate of 170s.  She was given diltiazem without relief.  She was given adenosine which broke her out of the rhythm to a sinus rhythm.  Chest x-ray showed right perihilar airspace disease concerning for pneumonia.  Hospitalist service was asked to admit the patient for further care and management.  Currently feels better, complaining of generalized weakness.  Felt nauseated earlier today but has not vomited.  Currently denies any chest pain.  Denies any abdominal pain or diarrhea.   PT Comments   Pt has not made significant progress toward her goals of being mod. Independent and pt's family worried about pt and her husband being able to care for each other with pt still not at independent level.  Pt will be much safer going to SNF for further therapy to maximize Independence before going back home.   Follow Up Recommendations  SNF     Does the patient have the potential to tolerate intense rehabilitation     Barriers to Discharge        Equipment Recommendations  Other (comment) (TBA at next venue)    Recommendations for Other Services    Frequency Min 3X/week   Progress towards PT Goals Progress towards PT goals:  Progressing toward goals (though slowly)  Plan Discharge plan needs to be updated    Precautions / Restrictions Precautions Precautions: Fall Restrictions Weight Bearing Restrictions: No   Pertinent Vitals/Pain     Mobility  Bed Mobility Bed Mobility: Not assessed Transfers Transfers: Sit to Stand;Stand to Sit Sit to Stand: With upper extremity assist;From chair/3-in-1;4: Min assist Stand to Sit: 4: Min guard;With upper extremity assist;To chair/3-in-1 Details for Transfer Assistance: tc/guiding for hand placement, very little lifting assist. Ambulation/Gait Ambulation/Gait Assistance: 4: Min guard Ambulation Distance (Feet): 250 Feet Assistive device: Rolling walker Ambulation/Gait Assistance Details: generally steady, but showed fatigue toward end.  Stopped 4 times to rest or recieve signing information Gait Pattern: Step-through pattern (wider BOS when fatigued) Stairs: No (pt didn't think she had the energy to do them)    Exercises     PT Diagnosis:    PT Problem List:   PT Treatment Interventions:     PT Goals (current goals can now be found in the care plan section) Acute Rehab PT Goals PT Goal Formulation: With patient Time For Goal Achievement: 01/06/14 Potential to Achieve Goals: Fair  Visit Information  Last PT Received On: 12/29/13 Assistance Needed: +1 History of Present Illness: [name] is a 58 y.o. 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  Sign interpreter was used to help translate.  Patient is a poor historian.  She reports that she has been  feeling weak since the summer.  However, after Christmas she developed cough, sinus congestion and drainage.  She initially went to [facility].  She was found to be tachycardic with heart rate of 170s.  She was given diltiazem without  relief.  She was given adenosine which broke her out of the rhythm to a sinus rhythm.  Chest x-ray showed right perihilar airspace disease concerning for pneumonia.  Hospitalist service was asked to admit the patient for further care and management.  Currently feels better, complaining of generalized weakness.  Felt nauseated earlier today but has not vomited.  Currently denies any chest pain.  Denies any abdominal pain or diarrhea.    Subjective Data  Subjective: I'm tired and weak.Marland KitchenMarland KitchenMarland KitchenI don't know what I can do, she sign to interpreter   Cognition  Cognition Arousal/Alertness: Awake/alert;Lethargic Behavior During Therapy: Flat affect Overall Cognitive Status: Within Functional Limits for tasks assessed    Balance  Balance Balance Assessed: Yes Static Standing Balance Static Standing - Balance Support: Right upper extremity supported;Left upper extremity supported Static Standing - Level of Assistance: 5: Stand by assistance Static Standing - Comment/# of Minutes: could stand in RW and sign with one or two hands at times without stability assist  End of Session PT - End of Session Equipment Utilized During Treatment: Oxygen Activity Tolerance: Patient tolerated treatment well Patient left: in chair;with call bell/phone within reach;Other (comment) (interpreter and social work left in room with pt.) Nurse Communication: Mobility status;Other (comment)   GP     Lada Fulbright, Tessie Fass 12/29/2013, 11:47 AM 12/29/2013  [name], PT [phone]  (pager)

## 2013-12-29 NOTE — Clinical Social Work Psych Note (Addendum)
Late note: 1/5 - Psych CSW spoke with Psychiatrist who reports that capacity was documented.  Psychiatry determined that pt HAD capacity to make her own medical decisions.  Unit CSW was updated and will assist with disposition needs.  Psych CSW remains available for assistance as needed.  Psychiatry was consulted for capacity.  Psychiatry evaluated patient on 12/25/2013.  Nothing documented speaks to capacity.  Psych CSW contacted psychiatrist requesting specific documentation regarding whether pt had capacity to make her own living arrangement decisions.    Nonnie Done, [place] [phone]  Clinical Social Work

## 2013-12-29 NOTE — Progress Notes (Signed)
S: Says she is eating well O:BP 176/80  Pulse 66  Temp(Src) 97.5 F (36.4 C) (Oral)  Resp 18  Ht 5\' 4"  (1.626 m)  Wt 106.5 kg (234 lb 12.6 oz)  BMI 40.28 kg/m2  SpO2 100%  Intake/Output Summary (Last 24 hours) at 12/29/13 1423 Last data filed at 12/29/13 1300  Gross per 24 hour  Intake   1320 ml  Output   2100 ml  Net   -780 ml   Weight change: -1.093 kg (-2 lb 6.6 oz) [id number] and alert CVS:RRR Resp:basilar crackles Abd:+ BS NTND Ext:2-3+ edema NEURO: Deaf  Follows commands  No asterixis   . aspirin  325 mg Oral Daily  . FLUoxetine  20 mg Oral Daily  . furosemide  80 mg Intravenous TID  . guaiFENesin  1,200 mg Oral BID  . heparin  5,000 Units Subcutaneous Q8H  . hydrALAZINE  75 mg Oral Q8H  . insulin aspart  0-5 Units Subcutaneous QHS  . insulin aspart  0-9 Units Subcutaneous TID WC  . ipratropium  0.5 mg Nebulization Q8H  . levalbuterol  0.63 mg Nebulization Q8H  . levofloxacin  750 mg Oral Q48H  . metoprolol tartrate  100 mg Oral BID  . sodium chloride  3 mL Intravenous Q12H   No results found. BMET    Component Value Date/Time   NA 138 12/29/2013 0450   K 4.5 12/29/2013 0450   CL 104 12/29/2013 0450   CO2 19 12/29/2013 0450   GLUCOSE 119* 12/29/2013 0450   BUN 72* 12/29/2013 0450   CREATININE 4.77* 12/29/2013 0450   CREATININE 0.76 05/23/2011 0956   CALCIUM 7.9* 12/29/2013 0450   GFRNONAA 9* 12/29/2013 0450   GFRAA 11* 12/29/2013 0450   CBC    Component Value Date/Time   WBC 8.1 12/27/2013 0420   RBC 2.65* 12/27/2013 0420   HGB 8.0* 12/27/2013 0420   HCT 24.1* 12/27/2013 0420   PLT 363 12/27/2013 0420   MCV 90.9 12/27/2013 0420   MCH 30.2 12/27/2013 0420   MCHC 33.2 12/27/2013 0420   RDW 13.5 12/27/2013 0420   LYMPHSABS 3.4 12/25/2013 0553   MONOABS 0.5 12/25/2013 0553   EOSABS 0.1 12/25/2013 0553   BASOSABS 0.0 12/25/2013 0553     Assessment: 1. Acute on ? CKD.  SPEP and FLC's unremarkable.  Scr about the same as yestwith good UO I am suspicious that this is mostly CKD. ? If Scr  leveling off 2. HTN 3. DM 4. anemia  Plan: 1. Change lasix to 160mg  BID 2. Daily Scr 3. Check PTH and iron studies 4. Start PO4 binders   [name] T

## 2013-12-29 NOTE — Progress Notes (Signed)
Clinical Social Work Department BRIEF PSYCHOSOCIAL ASSESSMENT 12/29/2013  Patient:  [name], [name]     Account Number:  [id number]     Admit date:  12/22/2013  Clinical Social Worker:  Megan Salon  Date/Time:  12/29/2013 12:12 PM  Referred by:  Physician  Date Referred:  12/29/2013 Referred for  SNF Placement   Other Referral:   Interview type:  Patient Other interview type:   CSW and CM spoke to patient with PT present and translator present    PSYCHOSOCIAL DATA Living Status:  HUSBAND Admitted from facility:   Level of care:   Primary support name:  Otilio Miu Primary support relationship to patient:  CHILD, ADULT Degree of support available:   Adequate    CURRENT CONCERNS Current Concerns  Post-Acute Placement   Other Concerns:    SOCIAL WORK ASSESSMENT / PLAN Clinical Social Worker received referral for SNF placement at d/c. CSW explained SNF process via Optometrist. Patient reported she is agreeable for SNF placement only if it is short term. CSW explained that it was only for short term and patient then agreed stating she will do what she has to go to get better.  CSW explained that the only bed offer made [facility] at [name]. Per translator explaining this, patient agreed to go to [place]. CSW has confirmed a bed with [facility].   Assessment/plan status:  Psychosocial Support/Ongoing Assessment of Needs Other assessment/ plan:   Information/referral to community resources:   SNF information    PATIENT'S/FAMILY'S RESPONSE TO PLAN OF CARE: Patient is agreeable to SNF placement.       Jeanette Caprice, MSW, [place]

## 2013-12-29 NOTE — Progress Notes (Signed)
[facility] PROGRESS NOTE  [name] [id number] DOB: 10/31/1956 DOA: 12/22/2013 PCP: Pcp Not In System Off service summary: 58 y.o.female with history of hypertension, hyperlipidemia, hearing impaired, ? Developmental disability vs psychiatric d/o, anemia, prior HgbA1c of 9.2 in 2012 and heart murmur who presented c/o SOB and racing heart. Sign language interpreter was used to help translate. Patient was a poor historian. She reported that she had been feeling weak since this past summer. However, after Christmas she developed cough, sinus congestion and drainage. She initially went to [facility]. She was found to be tachycardic with heart rate of 170s. She was given PO diltiazem without relief. She was given adenosine and subsequently converted to sinus rhythm. Chest x-ray showed right perihilar airspace disease concerning for pneumonia.  2d echo obtained with findings worrisome for possible amyloid. W/u pending. Pt also has ARF that does not seem to be improved with IVF. Nephrology is consulted and are following. Pt has a remote hx of schizophrenia, currently untreated. Psych was consulted at the request of pt's daughter. Assessment/Plan: Acute respiratory failure with hypoxia/Community acquired pneumonia  Patient with some audible wheezing per nursing. Improving clinically. Patient still with a cough and physical exam with no expiratory wheezing. Continue oral levaquin. Continue Mucinex  1200 mg twice a day. Continue scheduled nebulizer treatment. Continue oxygen.  -Flu PCR negative  Tachycardia/SVT??  -resolved and likely physiologic due to hypoxia  -follow on tele  HYPERTENSION, ESSENTIAL, UNCONTROLLED  -issues of noncompliance in past due to reported disbelief that she actually has this diagnosis. Discontinued Norvasc. Increased Lopressor to 100 mg by mouth twice a day. Increase hydralazine 100 mg 3 times a day. Currently on Lasix. Follow Severe concentric left ventricular  hypertrophy/moderate pulmonary HTN/grade 1 diastolic dysfunction  -apparent new findings- no prior ECHO to compare - ?? HOCM  -? of underlying amyloid so Cards consulted - appreciate recs  Chronic bilateral LEG EDEMA  -stable at present. Will saline lock IV fluids. Discontinued Norvasc. Lasix started per renal.  ?? DM type 2  -previous HgA1c was 9.2 in 2012  -this admit 5.9  -pt admitted to significant dietary changes after was initially told she had DM including eliminating exogenous sugar  -at present has diet controlled DM and no indication to begin medication  CKD stage 4  -renal US shows mild medical renal dz and no old Scr to compare  -if concern re: amyloid (see above) may need bx. Nephrology doubts if patient has amyloid. -Nephrology ff -Renal function with no significant change. Renal function with no significant change from yesterday creatinine currently at 4.77 from 4.71 today. NSL  IV fluids. Patient with bilateral lower extremity edema.  Patient started on diuretics per renal. HYPERLIPIDEMIA  ?? PSYCHIATRIC DISORDER/DEAF/Depression -previously dx'd with psychotic disorder NOS in 2011  -not on meds pre admit  -seems to have a degree of developmental disability and combined with her hearing loss she certainly could develop acute psychotic sx's  -for best interaction with this patient use of the sign language inter[retor is best -Question of decision making capacity. Patient has been seen by psychiatry and patient has been started on Prozac for depression. -Pt's daughter reports a hx of prior schizophrenia and is concerned that pt is currently not being treated for this (had previously been treated by psych)  Code Status: Full Family Communication: Pt in room and communicated via interpreter. No family present. Disposition Plan: Pending  Consultants: Cardiology Nephrology Psychiatry  Antibiotics: Zithromax 12/29 >>> 12/27/13 Rocephin 12/29 >>>  12/27/13 levaquin  12/27/13  HPI/Subjective: Complains of blurry vision (in the setting of known diabetic retinopathy with planned laser treatment as an outpatient). Patient states feeling a little better. No change in cough. Patient c/o weakness.  Objective: Filed Vitals:   12/28/13 1926 12/28/13 2015 12/29/13 0007 12/29/13 0407  BP:  192/80 184/75 155/77  Pulse:  74 67 67  Temp:  97.8 F (36.6 C)  97.5 F (36.4 C)  TempSrc:  Oral  Oral  Resp:  18  18  Height:      Weight:    106.5 kg (234 lb 12.6 oz)  SpO2: 98% 98%  99%    Intake/Output Summary (Last 24 hours) at 12/29/13 1110 Last data filed at 12/29/13 0300  Gross per 24 hour  Intake    720 ml  Output   1400 ml  Net   -680 ml   Filed Weights   12/27/13 0356 12/28/13 0456 12/29/13 0407  Weight: 108.636 kg (239 lb 8 oz) 107.593 kg (237 lb 3.2 oz) 106.5 kg (234 lb 12.6 oz)    Exam:   General:  Awake, in nad  Cardiovascular: Regular, s1, s2  Respiratory: normal resp effort, no wheezing. Coarse BS. Bibasilar crackles  Abdomen: soft,nondistended, positive bowel sounds, nontender  Musculoskeletal: perfused, no clubbing. 2+ bilateral lower extremity edema  Data Reviewed: Basic Metabolic Panel:  Recent Labs Lab 12/24/13 0545 12/24/13 1315 12/25/13 0553 12/26/13 0645 12/27/13 0420 12/28/13 0720 12/29/13 0450  NA 140  --  141 139 137  137 137 138  K 3.9  --  4.0 4.1 4.5  4.5 4.7 4.5  CL 107  --  108 106 105  105 106 104  CO2 19  --  19 19 16*  17* 19 19  GLUCOSE 129*  --  111* 114* 96  95 114* 119*  BUN 63*  --  67* 64* 65*  66* 69* 72*  CREATININE 3.99*  --  4.31* 4.30* 4.39*  4.39* 4.71* 4.77*  CALCIUM 7.5*  --  7.5* 7.4* 7.3*  7.4* 7.5* 7.9*  MG  --  2.0  --   --   --   --   --   PHOS  --   --   --  6.0* 6.7*  6.8* 7.1* 7.4*   Liver Function Tests:  Recent Labs Lab 12/24/13 1315 12/26/13 0645 12/27/13 0420 12/28/13 0720 12/29/13 0450  AST 25  --   --   --   --   ALT 22  --   --   --   --   ALKPHOS 99   --   --   --   --   BILITOT <0.2*  --   --   --   --   PROT 5.8*  --   --   --   --   ALBUMIN 1.5* 1.5* 1.4*  1.4* 1.5* 1.5*   No results found for this basename: LIPASE, AMYLASE,  in the last 168 hours No results found for this basename: AMMONIA,  in the last 168 hours CBC:  Recent Labs Lab 12/22/13 1908 12/23/13 0425 12/24/13 0545 12/25/13 0553 12/27/13 0420  WBC 5.3 5.9 6.1 7.6 8.1  NEUTROABS 3.3  --   --  3.6  --   HGB 10.8* 9.8* 9.6* 8.7* 8.0*  HCT 32.2* 29.2* 28.7* 26.3* 24.1*  MCV 90.2 89.6 89.4 90.4 90.9  PLT 324 316 329 311 363   Cardiac Enzymes:  Recent Labs Lab 12/22/13 2026 12/23/13  0425 12/23/13 0955  TROPONINI <0.30 <0.30 <0.30   BNP (last 3 results) No results found for this basename: PROBNP,  in the last 8760 hours CBG:  Recent Labs Lab 12/28/13 0609 12/28/13 1133 12/28/13 1637 12/28/13 2113 12/29/13 0559  GLUCAP 110* 102* 98 114* 106*    Recent Results (from the past 240 hour(s))  CULTURE, BLOOD (ROUTINE X 2)     Status: None   Collection Time    12/22/13 10:28 PM      Result Value Range Status   Specimen Description BLOOD RIGHT HAND   Final   Special Requests BOTTLES DRAWN AEROBIC ONLY 3CC   Final   Culture  Setup Time     Final   Value: 09/29/202014 04:36     Performed at [facility]     Final   Value: NO GROWTH 5 DAYS     Performed at [facility]   Report Status 12/29/2013 FINAL   Final  CULTURE, BLOOD (ROUTINE X 2)     Status: None   Collection Time    12/22/13 10:40 PM      Result Value Range Status   Specimen Description BLOOD LEFT HAND   Final   Special Requests BOTTLES DRAWN AEROBIC ONLY [facility]   Final   Culture  Setup Time     Final   Value: 09/29/202014 04:36     Performed at [facility]   Culture     Final   Value: NO GROWTH 5 DAYS     Performed at [facility]   Report Status 12/29/2013 FINAL   Final  MRSA PCR SCREENING     Status: None   Collection Time    12/23/13  2:24 AM       Result Value Range Status   MRSA by PCR NEGATIVE  NEGATIVE Final   Comment:            The GeneXpert MRSA Assay (FDA     approved for NASAL specimens     only), is one component of a     comprehensive MRSA colonization     surveillance program. It is not     intended to diagnose MRSA     infection nor to guide or     monitor treatment for     MRSA infections.  CULTURE, BLOOD (ROUTINE X 2)     Status: None   Collection Time    12/23/13  4:25 AM      Result Value Range Status   Specimen Description BLOOD RIGHT ANTECUBITAL   Final   Special Requests BOTTLES DRAWN AEROBIC ONLY 5CC   Final   Culture  Setup Time     Final   Value: 09/29/202014 08:58     Performed at [facility]   Culture     Final   Value: NO GROWTH 5 DAYS     Performed at [facility]   Report Status 12/29/2013 FINAL   Final  CULTURE, BLOOD (ROUTINE X 2)     Status: None   Collection Time    12/23/13  4:30 AM      Result Value Range Status   Specimen Description BLOOD RIGHT HAND   Final   Special Requests BOTTLES DRAWN AEROBIC ONLY 3CC   Final   Culture  Setup Time     Final   Value: 09/29/202014 08:58     Performed at [facility]  Final   Value: NO GROWTH 5 DAYS     Performed at [facility]   Report Status 12/29/2013 FINAL   Final  URINE CULTURE     Status: None   Collection Time    12/23/13 10:11 AM      Result Value Range Status   Specimen Description URINE, RANDOM   Final   Special Requests NONE   Final   Culture  Setup Time     Final   Value: 01-09-202014 17:57     Performed at [place]     Final   Value: NO GROWTH     Performed at [facility]   Culture     Final   Value: NO GROWTH     Performed at [facility]   Report Status 12/24/2013 FINAL   Final     Studies: No results found.  Scheduled Meds: . aspirin  325 mg Oral Daily  . FLUoxetine  20 mg Oral Daily  . furosemide  80 mg Intravenous TID  .  guaiFENesin  1,200 mg Oral BID  . heparin  5,000 Units Subcutaneous Q8H  . hydrALAZINE  75 mg Oral Q8H  . insulin aspart  0-5 Units Subcutaneous QHS  . insulin aspart  0-9 Units Subcutaneous TID WC  . ipratropium  0.5 mg Nebulization Q8H  . levalbuterol  0.63 mg Nebulization Q8H  . levofloxacin  750 mg Oral Q48H  . metoprolol tartrate  100 mg Oral BID  . sodium chloride  3 mL Intravenous Q12H   Continuous Infusions:    Principal Problem:   Acute respiratory failure with hypoxia Active Problems:   Community acquired pneumonia   HYPERLIPIDEMIA   PSYCHIATRIC DISORDER   DEAF MUTISM   HYPERTENSION, ESSENTIAL, UNCONTROLLED   LEG EDEMA   DM type 2 causing CKD stage 4   Acute renal failure   SVT (supraventricular tachycardia)   NSVT (nonsustained ventricular tachycardia)   Abnormal echocardiogram   Chronic diastolic CHF (congestive heart failure)   Anemia   Constipation  Time spent: 23min  [facility] MD [facility] Pager [phone]. If 7PM-7AM, please contact night-coverage at [email or web address], password [facility] 12/29/2013, 11:10 AM  LOS: 7 days

## 2013-12-29 NOTE — Progress Notes (Signed)
The Chaplain stopped by for an initial visit with the patient, but the patient was deaf so the visit could not take place.  Chaplain Clista Bernhardt Junior Huezo

## 2013-12-30 DIAGNOSIS — I10 Essential (primary) hypertension: Secondary | ICD-10-CM

## 2013-12-30 LAB — RENAL FUNCTION PANEL
Albumin: 1.3 g/dL — ABNORMAL LOW (ref 3.5–5.2)
BUN: 74 mg/dL — ABNORMAL HIGH (ref 6–23)
CO2: 17 mEq/L — ABNORMAL LOW (ref 19–32)
Calcium: 7.5 mg/dL — ABNORMAL LOW (ref 8.4–10.5)
Chloride: 100 mEq/L (ref 96–112)
Creatinine, Ser: 4.65 mg/dL — ABNORMAL HIGH (ref 0.50–1.10)
GFR calc Af Amer: 11 mL/min — ABNORMAL LOW (ref >90)
GFR calc non Af Amer: 10 mL/min — ABNORMAL LOW (ref >90)
Glucose, Bld: 115 mg/dL — ABNORMAL HIGH (ref 70–99)
Phosphorus: 7.2 mg/dL — ABNORMAL HIGH (ref 2.3–4.6)
Potassium: 5.3 mEq/L (ref 3.7–5.3)
Sodium: 134 mEq/L — ABNORMAL LOW (ref 137–147)

## 2013-12-30 LAB — UIFE/LIGHT CHAINS/TP QN, 24-HR UR
Alpha 1, Urine: DETECTED — AB
Alpha 2, Urine: DETECTED — AB
Beta, Urine: DETECTED — AB
Free Kappa Lt Chains,Ur: 53.7 mg/dL — ABNORMAL HIGH (ref 0.14–2.42)
Free Kappa/Lambda Ratio: 2.71 ratio (ref 2.04–10.37)
Free Lambda Lt Chains,Ur: 19.8 mg/dL — ABNORMAL HIGH (ref 0.02–0.67)
Gamma Globulin, Urine: DETECTED — AB
Total Protein, Urine: 437.8 mg/dL

## 2013-12-30 LAB — GLUCOSE, CAPILLARY
Glucose-Capillary: 112 mg/dL — ABNORMAL HIGH (ref 70–99)
Glucose-Capillary: 110 mg/dL — ABNORMAL HIGH (ref 70–99)
Glucose-Capillary: 108 mg/dL — ABNORMAL HIGH (ref 70–99)
Glucose-Capillary: 140 mg/dL — ABNORMAL HIGH (ref 70–99)

## 2013-12-30 LAB — PARATHYROID HORMONE, INTACT (NO CA): PTH: 289.2 pg/mL — ABNORMAL HIGH (ref 14.0–72.0)

## 2013-12-30 MED ORDER — DARBEPOETIN ALFA-POLYSORBATE 100 MCG/0.5ML IJ SOLN
100.0000 ug | INTRAMUSCULAR | Status: DC
  Administered 2013-12-30 – 2014-01-06 (×2): 100 ug via SUBCUTANEOUS
  Filled 2013-12-30 (×3): qty 0.5

## 2013-12-30 MED ORDER — SODIUM POLYSTYRENE SULFONATE 15 GM/60ML PO SUSP
30.0000 g | Freq: Once | ORAL | Status: AC
  Administered 2013-12-30: 30 g via ORAL
  Filled 2013-12-30: qty 120

## 2013-12-30 MED ORDER — FUROSEMIDE 10 MG/ML IJ SOLN
160.0000 mg | Freq: Three times a day (TID) | INTRAVENOUS | Status: DC
  Administered 2013-12-30 – 2014-01-03 (×10): 160 mg via INTRAVENOUS
  Filled 2013-12-30 (×14): qty 16

## 2013-12-30 MED ORDER — HYDRALAZINE HCL 50 MG PO TABS
100.0000 mg | ORAL_TABLET | Freq: Four times a day (QID) | ORAL | Status: DC
  Administered 2013-12-30 – 2014-01-03 (×13): 100 mg via ORAL
  Filled 2013-12-30 (×19): qty 2

## 2013-12-30 NOTE — Progress Notes (Signed)
S: Pt seen at bedside with sign language interpreter. Pt having difficulty seeing, but complains of "just being weak, worse." ROS otherwise limited. Per nursing, pt without acute events.  O:BP 163/70  Pulse 63  Temp(Src) 97.4 F (36.3 C) (Oral)  Resp 20  Ht 5\' 4"  (1.626 m)  Wt 234 lb 12.6 oz (106.5 kg)  BMI 40.28 kg/m2  SpO2 100%  Intake/Output Summary (Last 24 hours) at 12/30/13 0852 Last data filed at 12/29/13 1700  Gross per 24 hour  Intake    840 ml  Output   1500 ml  Net   -660 ml   Weight change: 0 lb (0 kg) Gen: awake and alert, but slightly somnolent CVS: RRR, no definite murmur appreciated Resp: soft basilar crackles but normal WOB on [place] Abd:+ BS, soft NTND Ext: bilateral symmetric 2-3+ edema to upper calf NEURO: Deaf, follows simple commands but difficult to interact secondary to poor eyesight; no new deficit   . aspirin  325 mg Oral Daily  . calcium acetate  667 mg Oral TID WC  . FLUoxetine  20 mg Oral Daily  . furosemide  160 mg Intravenous BID  . guaiFENesin  1,200 mg Oral BID  . heparin  5,000 Units Subcutaneous Q8H  . hydrALAZINE  100 mg Oral Q8H  . insulin aspart  0-5 Units Subcutaneous QHS  . insulin aspart  0-9 Units Subcutaneous TID WC  . ipratropium  0.5 mg Nebulization Q8H  . levalbuterol  0.63 mg Nebulization Q8H  . levofloxacin  750 mg Oral Q48H  . metoprolol tartrate  100 mg Oral BID  . sodium chloride  3 mL Intravenous Q12H   No results found. BMET    Component Value Date/Time   NA 134* 12/30/2013 0309   K 5.3 12/30/2013 0309   CL 100 12/30/2013 0309   CO2 17* 12/30/2013 0309   GLUCOSE 115* 12/30/2013 0309   BUN 74* 12/30/2013 0309   CREATININE 4.65* 12/30/2013 0309   CREATININE 0.76 05/23/2011 0956   CALCIUM 7.5* 12/30/2013 0309   GFRNONAA 10* 12/30/2013 0309   GFRAA 11* 12/30/2013 0309   CBC    Component Value Date/Time   WBC 8.1 12/27/2013 0420   RBC 2.65* 12/27/2013 0420   HGB 8.0* 12/27/2013 0420   HCT 24.1* 12/27/2013 0420   PLT 363 12/27/2013 0420   MCV 90.9 12/27/2013 0420   MCH 30.2 12/27/2013 0420   MCHC 33.2 12/27/2013 0420   RDW 13.5 12/27/2013 0420   LYMPHSABS 3.4 12/25/2013 0553   MONOABS 0.5 12/25/2013 0553   EOSABS 0.1 12/25/2013 0553   BASOSABS 0.0 12/25/2013 0553     Assessment: 1. Acute on ? CKD with uncertain baseline. SPEP and FLC's unremarkable.  UOP remains good, Cr appears to be plateauing around 4.7. 2. HTN  3. DM 4. Anemia - Hb last checked 1/3. Iron studies suggest anemia of chronic disease, likely DM+HTN+CKD  Plan: 1. Continue Lasix to 160mg  BID IV, consider change to PO if Cr starts to improve 2. Trend Cr and electrolytes 3. F/u pending PTH.  4. Continue PO4 binders  Please see also pending attending cosign for edits / additions.  Emmaline Kluver, MD PGY-2, [facility] 12/30/2013, 8:55 AM I have seen and examined this patient and agree with plan  Per Dr Venetia Maxon. UO marginal so will increase lasix to TID.  Tried to have discussion regarding future of her kidneys using the interpreter but not sure how much she understands.  Interpreter will cont  to try to educate. I suspect most of this is CKD and though she does not need HD now, she will in future if she chooses. Will start aranesp. Demian Maisel T,MD 12/30/2013 11:34 AM

## 2013-12-30 NOTE — Progress Notes (Signed)
CSW updated patient's son about SNF plans. Patient's son states he is happy that patient is going to SNF. CSW will update patient and patient's family when patient is medically ready.  Jeanette Caprice, MSW, [place]

## 2013-12-30 NOTE — Progress Notes (Signed)
[facility] PROGRESS NOTE  [name] [id number] DOB: 1956-02-14 DOA: 12/22/2013 PCP: Pcp Not In System Off service summary: 58 y.o.female with history of hypertension, hyperlipidemia, hearing impaired, ? Developmental disability vs psychiatric d/o, anemia, prior HgbA1c of 9.2 in 2012 and heart murmur who presented c/o SOB and racing heart. Sign language interpreter was used to help translate. Patient was a poor historian. She reported that she had been feeling weak since this past summer. However, after Christmas she developed cough, sinus congestion and drainage. She initially went to [facility]. She was found to be tachycardic with heart rate of 170s. She was given PO diltiazem without relief. She was given adenosine and subsequently converted to sinus rhythm. Chest x-ray showed right perihilar airspace disease concerning for pneumonia.  2d echo obtained with findings worrisome for possible amyloid. W/u pending. Pt also has ARF that does not seem to be improved with IVF. Nephrology is consulted and are following. Pt has a remote hx of schizophrenia, currently untreated. Psych was consulted at the request of pt's daughter. Assessment/Plan: Acute respiratory failure with hypoxia/Community acquired pneumonia  Patient with decreased wheezing. Improving clinically. Patient still with a cough and physical exam with no expiratory wheezing. Will d/c levaquin as pt now with 8 days of treatment. Continue Mucinex  1200 mg twice a day. Continue scheduled nebulizer treatment. Continue oxygen.  -Flu PCR negative  Tachycardia/SVT??  -resolved and likely physiologic due to hypoxia  -follow on tele  HYPERTENSION, ESSENTIAL, UNCONTROLLED  -issues of noncompliance in past due to reported disbelief that she actually has this diagnosis. Discontinued Norvasc. Increased Lopressor to 100 mg by mouth twice a day. Increase hydralazine 100 mg 4 times a day. Currently on Lasix. Follow Severe concentric  left ventricular hypertrophy/moderate pulmonary HTN/grade 1 diastolic dysfunction  -apparent new findings- no prior ECHO to compare - ?? HOCM  -? of underlying amyloid so Cards consulted - appreciate recs  Chronic bilateral LEG EDEMA  - Improving with diureses. Discontinued Norvasc. Lasix started per renal.  ?? DM type 2  -previous HgA1c was 9.2 in 2012  -this admit 5.9  -pt admitted to significant dietary changes after was initially told she had DM including eliminating exogenous sugar  -at present has diet controlled DM and no indication to begin medication  CKD stage 4  -renal US shows mild medical renal dz and no old Scr to compare  -if concern re: amyloid (see above) may need bx. Nephrology doubts if patient has amyloid. -Nephrology ff -Renal function seems to be plataeuing, and now at 4.65 from 4.77 from 4.71. NSL  IV fluids. Patient with bilateral lower extremity edema.  Patient started on diuretics per renal. HYPERLIPIDEMIA  ?? PSYCHIATRIC DISORDER/DEAF/Depression -previously dx'd with psychotic disorder NOS in 2011  -not on meds pre admit  -seems to have a degree of developmental disability and combined with her hearing loss she certainly could develop acute psychotic sx's  -for best interaction with this patient use of the sign language inter[retor is best -Per psych patient with capacity. Patient has been seen by psychiatry and patient has been started on Prozac for depression. -Pt's daughter reports a hx of prior schizophrenia and is concerned that pt is currently not being treated for this (had previously been treated by psych)  Code Status: Full Family Communication: Pt in room and communicated via interpreter. No family present. Disposition Plan: SNF when medically stable.  Consultants: Cardiology Nephrology Psychiatry  Antibiotics: Zithromax 12/29 >>> 12/27/13 Rocephin 12/29 >>> 12/27/13 levaquin 12/27/13>>>>12/30/13  HPI/Subjective: Complains of blurry vision (in the  setting of known diabetic retinopathy with planned laser treatment as an outpatient). Patient states feeling a little better. No change in cough. Patient c/o weakness.  Objective: Filed Vitals:   12/29/13 2215 12/29/13 2226 12/30/13 0418 12/30/13 0612  BP:  188/81 163/70   Pulse:  68 63   Temp:  98.3 F (36.8 C) 97.4 F (36.3 C)   TempSrc:  Oral Oral   Resp:  18 20   Height:      Weight:   106.5 kg (234 lb 12.6 oz)   SpO2: 100% 100% 100% 100%    Intake/Output Summary (Last 24 hours) at 12/30/13 1043 Last data filed at 12/29/13 1700  Gross per 24 hour  Intake    480 ml  Output   1100 ml  Net   -620 ml   Filed Weights   12/28/13 0456 12/29/13 0407 12/30/13 0418  Weight: 107.593 kg (237 lb 3.2 oz) 106.5 kg (234 lb 12.6 oz) 106.5 kg (234 lb 12.6 oz)    Exam:   General:  Awake, in nad  Cardiovascular: Regular, s1, s2  Respiratory: normal resp effort, no wheezing. Coarse BS.  Abdomen: soft,nondistended, positive bowel sounds, nontender  Musculoskeletal: perfused, no clubbing. 2+ bilateral lower extremity edema  Data Reviewed: Basic Metabolic Panel:  Recent Labs Lab 12/24/13 0545 12/24/13 1315  12/26/13 0645 12/27/13 0420 12/28/13 0720 12/29/13 0450 12/30/13 0309  NA 140  --   < > 139 137  137 137 138 134*  K 3.9  --   < > 4.1 4.5  4.5 4.7 4.5 5.3  CL 107  --   < > 106 105  105 106 104 100  CO2 19  --   < > 19 16*  17* 19 19 17*  GLUCOSE 129*  --   < > 114* 96  95 114* 119* 115*  BUN 63*  --   < > 64* 65*  66* 69* 72* 74*  CREATININE 3.99*  --   < > 4.30* 4.39*  4.39* 4.71* 4.77* 4.65*  CALCIUM 7.5*  --   < > 7.4* 7.3*  7.4* 7.5* 7.9* 7.5*  MG  --  2.0  --   --   --   --   --   --   PHOS  --   --   --  6.0* 6.7*  6.8* 7.1* 7.4* 7.2*  < > = values in this interval not displayed. Liver Function Tests:  Recent Labs Lab 12/24/13 1315 12/26/13 0645 12/27/13 0420 12/28/13 0720 12/29/13 0450 12/30/13 0309  AST 25  --   --   --   --   --   ALT 22   --   --   --   --   --   ALKPHOS 99  --   --   --   --   --   BILITOT <0.2*  --   --   --   --   --   PROT 5.8*  --   --   --   --   --   ALBUMIN 1.5* 1.5* 1.4*  1.4* 1.5* 1.5* 1.3*   No results found for this basename: LIPASE, AMYLASE,  in the last 168 hours No results found for this basename: AMMONIA,  in the last 168 hours CBC:  Recent Labs Lab 12/24/13 0545 12/25/13 0553 12/27/13 0420  WBC 6.1 7.6 8.1  NEUTROABS  --  3.6  --  HGB 9.6* 8.7* 8.0*  HCT 28.7* 26.3* 24.1*  MCV 89.4 90.4 90.9  PLT 329 311 363   Cardiac Enzymes: No results found for this basename: CKTOTAL, CKMB, CKMBINDEX, TROPONINI,  in the last 168 hours BNP (last 3 results) No results found for this basename: PROBNP,  in the last 8760 hours CBG:  Recent Labs Lab 12/29/13 0559 12/29/13 1109 12/29/13 1634 12/29/13 2220 12/30/13 0611  GLUCAP 106* 131* 132* 124* 112*    Recent Results (from the past 240 hour(s))  CULTURE, BLOOD (ROUTINE X 2)     Status: None   Collection Time    12/22/13 10:28 PM      Result Value Range Status   Specimen Description BLOOD RIGHT HAND   Final   Special Requests BOTTLES DRAWN AEROBIC ONLY 3CC   Final   Culture  Setup Time     Final   Value: 2020/08/2313 04:36     Performed at [facility]   Culture     Final   Value: NO GROWTH 5 DAYS     Performed at [facility]   Report Status 12/29/2013 FINAL   Final  CULTURE, BLOOD (ROUTINE X 2)     Status: None   Collection Time    12/22/13 10:40 PM      Result Value Range Status   Specimen Description BLOOD LEFT HAND   Final   Special Requests BOTTLES DRAWN AEROBIC ONLY [facility]   Final   Culture  Setup Time     Final   Value: 2020/08/2313 04:36     Performed at [facility]   Culture     Final   Value: NO GROWTH 5 DAYS     Performed at [facility]   Report Status 12/29/2013 FINAL   Final  MRSA PCR SCREENING     Status: None   Collection Time    12/23/13  2:24 AM      Result Value Range  Status   MRSA by PCR NEGATIVE  NEGATIVE Final   Comment:            The GeneXpert MRSA Assay (FDA     approved for NASAL specimens     only), is one component of a     comprehensive MRSA colonization     surveillance program. It is not     intended to diagnose MRSA     infection nor to guide or     monitor treatment for     MRSA infections.  CULTURE, BLOOD (ROUTINE X 2)     Status: None   Collection Time    12/23/13  4:25 AM      Result Value Range Status   Specimen Description BLOOD RIGHT ANTECUBITAL   Final   Special Requests BOTTLES DRAWN AEROBIC ONLY 5CC   Final   Culture  Setup Time     Final   Value: 2020/08/2313 08:58     Performed at [facility]   Culture     Final   Value: NO GROWTH 5 DAYS     Performed at [facility]   Report Status 12/29/2013 FINAL   Final  CULTURE, BLOOD (ROUTINE X 2)     Status: None   Collection Time    12/23/13  4:30 AM      Result Value Range Status   Specimen Description BLOOD RIGHT HAND   Final   Special Requests BOTTLES DRAWN AEROBIC ONLY 3CC   Final  Culture  Setup Time     Final   Value: 10/09/2013 08:58     Performed at [facility]   Culture     Final   Value: NO GROWTH 5 DAYS     Performed at [facility]   Report Status 12/29/2013 FINAL   Final  URINE CULTURE     Status: None   Collection Time    12/23/13 10:11 AM      Result Value Range Status   Specimen Description URINE, RANDOM   Final   Special Requests NONE   Final   Culture  Setup Time     Final   Value: 10/09/2013 17:57     Performed at [place]     Final   Value: NO GROWTH     Performed at [facility]   Culture     Final   Value: NO GROWTH     Performed at [facility]   Report Status 12/24/2013 FINAL   Final     Studies: No results found.  Scheduled Meds: . aspirin  325 mg Oral Daily  . calcium acetate  667 mg Oral TID WC  . FLUoxetine  20 mg Oral Daily  . furosemide  160 mg  Intravenous BID  . guaiFENesin  1,200 mg Oral BID  . heparin  5,000 Units Subcutaneous Q8H  . hydrALAZINE  100 mg Oral Q8H  . insulin aspart  0-5 Units Subcutaneous QHS  . insulin aspart  0-9 Units Subcutaneous TID WC  . ipratropium  0.5 mg Nebulization Q8H  . levalbuterol  0.63 mg Nebulization Q8H  . levofloxacin  750 mg Oral Q48H  . metoprolol tartrate  100 mg Oral BID  . sodium chloride  3 mL Intravenous Q12H   Continuous Infusions:    Principal Problem:   Acute respiratory failure with hypoxia Active Problems:   Community acquired pneumonia   HYPERLIPIDEMIA   PSYCHIATRIC DISORDER   DEAF MUTISM   HYPERTENSION, ESSENTIAL, UNCONTROLLED   LEG EDEMA   DM type 2 causing CKD stage 4   Acute renal failure   SVT (supraventricular tachycardia)   NSVT (nonsustained ventricular tachycardia)   Abnormal echocardiogram   Chronic diastolic CHF (congestive heart failure)   Anemia   Constipation  Time spent: 62min  [facility] MD [facility] Pager [phone]. If 7PM-7AM, please contact night-coverage at [email or web address], password [facility] 12/30/2013, 10:43 AM  LOS: 8 days

## 2013-12-31 DIAGNOSIS — Z01810 Encounter for preprocedural cardiovascular examination: Secondary | ICD-10-CM

## 2013-12-31 DIAGNOSIS — H913 Deaf nonspeaking, not elsewhere classified: Secondary | ICD-10-CM

## 2013-12-31 LAB — GLUCOSE, CAPILLARY
Glucose-Capillary: 107 mg/dL — ABNORMAL HIGH (ref 70–99)
Glucose-Capillary: 116 mg/dL — ABNORMAL HIGH (ref 70–99)
Glucose-Capillary: 120 mg/dL — ABNORMAL HIGH (ref 70–99)

## 2013-12-31 LAB — RENAL FUNCTION PANEL
Albumin: 1.5 g/dL — ABNORMAL LOW (ref 3.5–5.2)
BUN: 75 mg/dL — ABNORMAL HIGH (ref 6–23)
CO2: 20 mEq/L (ref 19–32)
Calcium: 7.7 mg/dL — ABNORMAL LOW (ref 8.4–10.5)
Chloride: 102 mEq/L (ref 96–112)
Creatinine, Ser: 4.9 mg/dL — ABNORMAL HIGH (ref 0.50–1.10)
GFR calc Af Amer: 10 mL/min — ABNORMAL LOW (ref >90)
GFR calc non Af Amer: 9 mL/min — ABNORMAL LOW (ref >90)
Glucose, Bld: 101 mg/dL — ABNORMAL HIGH (ref 70–99)
Phosphorus: 7.2 mg/dL — ABNORMAL HIGH (ref 2.3–4.6)
Potassium: 4.5 mEq/L (ref 3.7–5.3)
Sodium: 139 mEq/L (ref 137–147)

## 2013-12-31 NOTE — Progress Notes (Signed)
VASCULAR LAB PRELIMINARY  PRELIMINARY  PRELIMINARY  PRELIMINARY  Right  Upper Extremity Vein Map    Cephalic  Segment Diameter Depth Comment  1. Axilla 2.39 mm mm   2. Mid upper arm 1.59 mm mm   3. Above AC 3.18 mm mm   4. In AC 6.46 mm mm   5. Below AC 1.49 mm mm   6. Mid forearm 1.14 mm mm   7. Wrist mm mm    mm mm    mm mm    mm mm    Basilic  Segment Diameter Depth Comment  1. Axilla mm mm   2. Mid upper arm 2.25 mm 15 mm   3. Above AC 1.85 mm 20 mm   4. In AC mm mm   5. Below AC mm mm   6. Mid forearm mm mm   7. Wrist mm mm    mm mm    mm mm    mm mm    Left Upper Extremity Vein Map    Cephalic  Segment Diameter Depth Comment  1. Axilla 3.13 mm mm   2. Mid upper arm 1.74 mm mm   3. Above AC 2.29 mm mm   4. In AC 2.49 mm mm   5. Below AC 1.54 mm mm   6. Mid forearm 1.39 mm mm   7. Wrist 2.09 mm mm    mm mm    mm mm    mm mm    Basilic  Segment Diameter Depth Comment  1. Axilla mm mm Not found  2. Mid upper arm mm mm   3. Above AC mm mm   4. In AC mm mm   5. Below AC mm mm   6. Mid forearm mm mm   7. Wrist mm mm    mm mm    mm mm    mm mm     Paquita Printy, RVT 12/31/2013, 2:51 PM

## 2013-12-31 NOTE — Progress Notes (Signed)
[facility] PROGRESS NOTE  [name] [id number] DOB: August 13, 1956 DOA: 12/22/2013 PCP: Pcp Not In System  Assessment/Plan: 1. Acute respiratory failure with hypoxia/Community acquired pneumonia: resolved.  No respiratory issues today.  Lungs clear. Has completed 8 d of Levaquin. continues on mucinex, nebs, 02. Flu negative. 2. Tachycardia/SVT: No further episodes. Likely due to hypoxia in acute setting.  3. HYPERTENSION, ESSENTIAL, UNCONTROLLED : Possible issue with noncompliance in past due to reported disbelief that she actually has this diagnosis. Discontinued Norvasc. Increased Lopressor to 100 mg by mouth twice a day. Increase hydralazine 100 mg 4 times a day. Currently on Lasix. 4. Severe concentric left ventricular hypertrophy/moderate pulmonary HTN/grade 1 diastolic dysfunction: Apparent new findings- no prior ECHO to compare - ?? HOCM -? of underlying amyloid. Cardiology has seen and recommended outpatient followup.  5. Chronic bilateral LEG EDEMA: Improving with diureses. Discontinued Norvasc. Lasix started per renal.  6. ?? DM type 2 -previous HgA1c was 9.2 in 2012, this admit 5.9. At present has diet controlled DM and no indication to begin medication  7. CKD stage 4 : Renal US shows mild medical renal dz and no old Scr to compare. Renal function seems to be plataeuing, and now at 4.65 from 4.77 from 4.71. NSL IV fluids. Patient with bilateral lower extremity edema. Patient started on diuretics per renal. May need to consider HD.  8. HYPERLIPIDEMIA  9. PSYCHIATRIC DISORDER/DEAF/Depression: Previously dx'd with psychotic disorder NOS in 2011. Not on meds pre admit. May have degree of developmental disability and combined with her hearing loss she certainly could develop acute psychotic sx's  10. Per psych patient with capacity. Patient has been seen by psychiatry and patient has been started on Prozac for depression. Pt's daughter reports a hx of prior schizophrenia and is  concerned that pt is currently not being treated for this (had previously been treated by psych) 15. Hearing loss: sign language interpreter  Code Status: full Family Communication: spoke with patient via sign language interpreter  Disposition Plan: inpatient. Will dc to SNF when medically stable.   Consultants: Cardiology  Nephrology  Psychiatry   Procedures:  none  Antibiotics: Zithromax 12/29 >>> 12/27/13  Rocephin 12/29 >>> 12/27/13  levaquin 12/27/13>>>>12/30/13   HPI/Subjective: Patient feels tired today.  No specific complaints.  No respiratory difficulty.   Objective: Filed Vitals:   12/31/13 1330  BP: 160/69  Pulse: 75  Temp: 97.4 F (36.3 C)  Resp: 18    Intake/Output Summary (Last 24 hours) at 12/31/13 1834 Last data filed at 12/31/13 1330  Gross per 24 hour  Intake    480 ml  Output    750 ml  Net   -270 ml   Filed Weights   12/29/13 0407 12/30/13 0418 12/31/13 0529  Weight: 106.5 kg (234 lb 12.6 oz) 106.5 kg (234 lb 12.6 oz) 104.917 kg (231 lb 4.8 oz)    Exam:   General:  NAD  Cardiovascular: RRR  Respiratory: CTAG with good air movement  Abdomen: BS+, soft, non tender  Musculoskeletal: 1+ edema bilaterally  Data Reviewed: Basic Metabolic Panel:  Recent Labs Lab 12/27/13 0420 12/28/13 0720 12/29/13 0450 12/30/13 0309 12/31/13 0310  NA 137  137 137 138 134* 139  K 4.5  4.5 4.7 4.5 5.3 4.5  CL 105  105 106 104 100 102  CO2 16*  17* 19 19 17* 20  GLUCOSE 96  95 114* 119* 115* 101*  BUN 65*  66* 69* 72* 74* 75*  CREATININE 4.39*  4.39* 4.71* 4.77* 4.65* 4.90*  CALCIUM 7.3*  7.4* 7.5* 7.9* 7.5* 7.7*  PHOS 6.7*  6.8* 7.1* 7.4* 7.2* 7.2*   Liver Function Tests:  Recent Labs Lab 12/27/13 0420 12/28/13 0720 12/29/13 0450 12/30/13 0309 12/31/13 0310  ALBUMIN 1.4*  1.4* 1.5* 1.5* 1.3* 1.5*   No results found for this basename: LIPASE, AMYLASE,  in the last 168 hours No results found for this basename: AMMONIA,  in the  last 168 hours CBC:  Recent Labs Lab 12/25/13 0553 12/27/13 0420  WBC 7.6 8.1  NEUTROABS 3.6  --   HGB 8.7* 8.0*  HCT 26.3* 24.1*  MCV 90.4 90.9  PLT 311 363   Cardiac Enzymes: No results found for this basename: CKTOTAL, CKMB, CKMBINDEX, TROPONINI,  in the last 168 hours BNP (last 3 results) No results found for this basename: PROBNP,  in the last 8760 hours CBG:  Recent Labs Lab 12/30/13 1658 12/30/13 2110 12/31/13 0634 12/31/13 1131 12/31/13 1714  GLUCAP 108* 140* 107* 116* 120*    Recent Results (from the past 240 hour(s))  CULTURE, BLOOD (ROUTINE X 2)     Status: None   Collection Time    12/22/13 10:28 PM      Result Value Range Status   Specimen Description BLOOD RIGHT HAND   Final   Special Requests BOTTLES DRAWN AEROBIC ONLY 3CC   Final   Culture  Setup Time     Final   Value: 2020-03-1213 04:36     Performed at [facility]   Culture     Final   Value: NO GROWTH 5 DAYS     Performed at [facility]   Report Status 12/29/2013 FINAL   Final  CULTURE, BLOOD (ROUTINE X 2)     Status: None   Collection Time    12/22/13 10:40 PM      Result Value Range Status   Specimen Description BLOOD LEFT HAND   Final   Special Requests BOTTLES DRAWN AEROBIC ONLY [facility]   Final   Culture  Setup Time     Final   Value: 2020-03-1213 04:36     Performed at [facility]   Culture     Final   Value: NO GROWTH 5 DAYS     Performed at [facility]   Report Status 12/29/2013 FINAL   Final  MRSA PCR SCREENING     Status: None   Collection Time    12/23/13  2:24 AM      Result Value Range Status   MRSA by PCR NEGATIVE  NEGATIVE Final   Comment:            The GeneXpert MRSA Assay (FDA     approved for NASAL specimens     only), is one component of a     comprehensive MRSA colonization     surveillance program. It is not     intended to diagnose MRSA     infection nor to guide or     monitor treatment for     MRSA infections.  CULTURE,  BLOOD (ROUTINE X 2)     Status: None   Collection Time    12/23/13  4:25 AM      Result Value Range Status   Specimen Description BLOOD RIGHT ANTECUBITAL   Final   Special Requests BOTTLES DRAWN AEROBIC ONLY 5CC   Final   Culture  Setup Time     Final   Value: 2020-03-1213 08:58  Performed at [facility]     Final   Value: NO GROWTH 5 DAYS     Performed at [facility]   Report Status 12/29/2013 FINAL   Final  CULTURE, BLOOD (ROUTINE X 2)     Status: None   Collection Time    12/23/13  4:30 AM      Result Value Range Status   Specimen Description BLOOD RIGHT HAND   Final   Special Requests BOTTLES DRAWN AEROBIC ONLY 3CC   Final   Culture  Setup Time     Final   Value: 10-07-202014 08:58     Performed at [facility]   Culture     Final   Value: NO GROWTH 5 DAYS     Performed at [facility]   Report Status 12/29/2013 FINAL   Final  URINE CULTURE     Status: None   Collection Time    12/23/13 10:11 AM      Result Value Range Status   Specimen Description URINE, RANDOM   Final   Special Requests NONE   Final   Culture  Setup Time     Final   Value: 10-07-202014 17:57     Performed at [place]     Final   Value: NO GROWTH     Performed at [facility]   Culture     Final   Value: NO GROWTH     Performed at [facility]   Report Status 12/24/2013 FINAL   Final     Studies: No results found.  Scheduled Meds: . aspirin  325 mg Oral Daily  . calcium acetate  667 mg Oral TID WC  . darbepoetin (ARANESP) injection - NON-DIALYSIS  100 mcg Subcutaneous Q Tue-1800  . FLUoxetine  20 mg Oral Daily  . furosemide  160 mg Intravenous TID  . guaiFENesin  1,200 mg Oral BID  . heparin  5,000 Units Subcutaneous Q8H  . hydrALAZINE  100 mg Oral Q6H  . insulin aspart  0-5 Units Subcutaneous QHS  . insulin aspart  0-9 Units Subcutaneous TID WC  . ipratropium  0.5 mg Nebulization Q8H  . levalbuterol   0.63 mg Nebulization Q8H  . metoprolol tartrate  100 mg Oral BID  . sodium chloride  3 mL Intravenous Q12H   Continuous Infusions:   Principal Problem:   Acute respiratory failure with hypoxia Active Problems:   HYPERLIPIDEMIA   PSYCHIATRIC DISORDER   DEAF MUTISM   HYPERTENSION, ESSENTIAL, UNCONTROLLED   LEG EDEMA   Community acquired pneumonia   DM type 2 causing CKD stage 4   Acute renal failure   SVT (supraventricular tachycardia)   NSVT (nonsustained ventricular tachycardia)   Abnormal echocardiogram   Chronic diastolic CHF (congestive heart failure)   Anemia   Constipation    Time spent: 30 minutes   [facility] Pager [phone] If 7PM-7AM, please contact night-coverage at [email or web address], password [facility] 12/31/2013, 6:34 PM  LOS: 9 days

## 2013-12-31 NOTE — Progress Notes (Signed)
CSW spoke to patient with interpreter present about SNF. CSW again provided the only two bed offers patient has, [facility] and [facility]. Patient states she is unsure about either one, but is choosing the facility by the closest one to the hospital. CSW explained to patient that if she was unhappy there, she can switch to the other facility. CSW also explained that we can set up non emergency transport. Patient was agreeable and thanked Education officer, museum for visit.   Jeanette Caprice, MSW, [place]

## 2013-12-31 NOTE — Progress Notes (Signed)
OT Cancellation Note  Patient Details Name: [name] MRN: [id number] DOB: November 04, 1956   Cancelled Treatment:    Reason Eval/Treat Not Completed: Patient at procedure or test/ unavailable. Pt in vascular lab.  Will continue to follow.  Malka So 12/31/2013, 2:22 PM

## 2013-12-31 NOTE — Progress Notes (Signed)
12/31/2013 6:43 PM Nursing note Spoke with patient's daughter Lavella Hammock. Updated her on patients plan of care. Pt. daughter states she would like to have a telephone update from MD and SW tomorrow on 01/01/14. Treatment team sticky notes updated on this request as well.  Jaziyah Gradel, Arville Lime

## 2013-12-31 NOTE — Progress Notes (Signed)
Physical Therapy Treatment Patient Details Name: [name] MRN: [id number] DOB: 1956/01/30 Today's Date: 12/31/2013 Time: [id number] PT Time Calculation (min): 12 min  PT Assessment / Plan / Recommendation  History of Present Illness [name] is a 58 y.o. 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  Sign interpreter was used to help translate.  Patient is a poor historian.  She reports that she has been feeling weak since the summer.  However, after Christmas she developed cough, sinus congestion and drainage.  She initially went to [facility].  She was found to be tachycardic with heart rate of 170s.  She was given diltiazem without relief.  She was given adenosine which broke her out of the rhythm to a sinus rhythm.  Chest x-ray showed right perihilar airspace disease concerning for pneumonia.  Hospitalist service was asked to admit the patient for further care and management.  Currently feels better, complaining of generalized weakness.  Felt nauseated earlier today but has not vomited.  Currently denies any chest pain.  Denies any abdominal pain or diarrhea.   PT Comments   Pt reports too weak this afternoon to go walking.  Lacks self-motivation to push herself.  Likely will need extra time to recover in SNF rehab.   Follow Up Recommendations  SNF     Does the patient have the potential to tolerate intense rehabilitation     Barriers to Discharge        Equipment Recommendations   (TBA)    Recommendations for Other Services    Frequency     Progress towards PT Goals Progress towards PT goals: Not progressing toward goals - comment (too weak to go walking today)  Plan Current plan remains appropriate    Precautions / Restrictions Precautions Precautions: Fall Restrictions Weight Bearing  Restrictions: No   Pertinent Vitals/Pain     Mobility  Bed Mobility Overal bed mobility: Needs Assistance Bed Mobility: Supine to Sit Supine to sit: Min assist General bed mobility comments: very little assist needed Transfers Overall transfer level: Needs assistance Equipment used: Rolling walker (2 wheeled) Transfers: Sit to/from Stand Sit to Stand: Min assist;Mod assist (mod from low surface) General transfer comment: guidance cues for safe hand placement Ambulation/Gait Ambulation/Gait assistance: Supervision;Min guard Ambulation Distance (Feet): 15 Feet (times 2 to/from bathroom) Assistive device: Rolling walker (2 wheeled) Gait Pattern/deviations: Step-through pattern    Exercises     PT Diagnosis:    PT Problem List:   PT Treatment Interventions:     PT Goals (current goals can now be found in the care plan section) Acute Rehab PT Goals Time For Goal Achievement: 01/06/14 Potential to Achieve Goals: Fair  Visit Information  Last PT Received On: 12/31/13 Assistance Needed: +1 History of Present Illness: [name] is a 58 y.o. 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  Sign interpreter was used to help translate.  Patient is a poor historian.  She reports that she has been feeling weak since the summer.  However, after Christmas she developed cough, sinus congestion and drainage.  She initially went to [facility].  She was found to be tachycardic with heart rate of 170s.  She was given diltiazem without relief.  She was given adenosine which broke  her out of the rhythm to a sinus rhythm.  Chest x-ray showed right perihilar airspace disease concerning for pneumonia.  Hospitalist service was asked to admit the patient for further care and management.  Currently feels better, complaining of  generalized weakness.  Felt nauseated earlier today but has not vomited.  Currently denies any chest pain.  Denies any abdominal pain or diarrhea.    Subjective Data  Subjective: Pt reports weak and doesn't want to walk this afternoon   Cognition  Cognition Arousal/Alertness: Awake/alert Behavior During Therapy: Flat affect Overall Cognitive Status: Within Functional Limits for tasks assessed    Balance  Balance Overall balance assessment: No apparent balance deficits (not formally assessed)  End of Session PT - End of Session Equipment Utilized During Treatment: Oxygen Activity Tolerance: Patient tolerated treatment well Patient left: in chair;with call bell/phone within reach;Other (comment) Nurse Communication: Mobility status   GP     [name], [name] 12/31/2013, 3:57 PM 12/31/2013  [name], [name] [phone]  (pager)

## 2013-12-31 NOTE — Progress Notes (Signed)
S: Pt seen at bedside. Language interpreter not present yet this morning. Per nursing, pt without acute events.  O:BP 167/72  Pulse 70  Temp(Src) 97.6 F (36.4 C) (Oral)  Resp 18  Ht 5\' 4"  (1.626 m)  Wt 231 lb 4.8 oz (104.917 kg)  BMI 39.68 kg/m2  SpO2 100%  Intake/Output Summary (Last 24 hours) at 12/31/13 0803 Last data filed at 12/30/13 1800  Gross per 24 hour  Intake    480 ml  Output    975 ml  Net   -495 ml  Output: about 1.2L 1/6 0700 to 1/7 0700, but only received Lasix x2 (lost IV access 1/6 until late PM)  Weight change: -3 lb 7.8 oz (-1.583 kg) Gen: awake and alert, responds to simple miming communication CVS: RRR, no definite murmur appreciated Resp: soft basilar crackles but normal WOB on [place] Abd:+ BS, soft NTND Ext: bilateral symmetric 2-3+ edema to upper calf, essentially unchanged NEURO: Deaf, no obvious new deficit   . aspirin  325 mg Oral Daily  . calcium acetate  667 mg Oral TID WC  . darbepoetin (ARANESP) injection - NON-DIALYSIS  100 mcg Subcutaneous Q Tue-1800  . FLUoxetine  20 mg Oral Daily  . furosemide  160 mg Intravenous TID  . guaiFENesin  1,200 mg Oral BID  . heparin  5,000 Units Subcutaneous Q8H  . hydrALAZINE  100 mg Oral Q6H  . insulin aspart  0-5 Units Subcutaneous QHS  . insulin aspart  0-9 Units Subcutaneous TID WC  . ipratropium  0.5 mg Nebulization Q8H  . levalbuterol  0.63 mg Nebulization Q8H  . metoprolol tartrate  100 mg Oral BID  . sodium chloride  3 mL Intravenous Q12H   No results found. BMET    Component Value Date/Time   NA 139 12/31/2013 0310   K 4.5 12/31/2013 0310   CL 102 12/31/2013 0310   CO2 20 12/31/2013 0310   GLUCOSE 101* 12/31/2013 0310   BUN 75* 12/31/2013 0310   CREATININE 4.90* 12/31/2013 0310   CREATININE 0.76 05/23/2011 0956   CALCIUM 7.7* 12/31/2013 0310   GFRNONAA 9* 12/31/2013 0310   GFRAA 10* 12/31/2013 0310   CBC    Component Value Date/Time   WBC 8.1 12/27/2013 0420   RBC 2.65* 12/27/2013 0420   HGB 8.0* 12/27/2013  0420   HCT 24.1* 12/27/2013 0420   PLT 363 12/27/2013 0420   MCV 90.9 12/27/2013 0420   MCH 30.2 12/27/2013 0420   MCHC 33.2 12/27/2013 0420   RDW 13.5 12/27/2013 0420   LYMPHSABS 3.4 12/25/2013 0553   MONOABS 0.5 12/25/2013 0553   EOSABS 0.1 12/25/2013 0553   BASOSABS 0.0 12/25/2013 0553     Assessment: 1. Acute on ? CKD with uncertain baseline. SPEP and FLC's unremarkable.  UOP remains marginal and pt did not receive increased Lasix 1/6 but still had slight increase in Cr from  2. Hyperphosphatemia / sec hyperparathytroidism - PTH high 1/5. Started binders this admission 3. HTN / DM - per primary team 4. Anemia - Hb last checked 1/3. Iron studies suggest anemia of chronic disease, likely DM+HTN+CKD. Started Aranesp 1/6.  Plan: 1. Continue Lasix 160mg  TID IV. May need to consider HD, but it is difficult to assess how well pt understands her situation to be able to give good consent. 2. Trend Cr and electrolytes, monitor UOP. 3. Continue PO4 binders  Please see also pending attending cosign for edits / additions.  Emmaline Kluver, MD PGY-2, [facility]  Family Medicine 12/31/2013, 8:03 AM I have seen and examined this patient and agree with plan per Dr Venetia Maxon.  Did not receive 3 doses of lasix today.  Scr up a little.  If unable to diurese then may need HD though discussing this has been difficult even with an interpreter.  Today we made some headway with the discussion about HD and she would be willing to do.  I think managing CKD as outpt will be extremely difficult and she would do better starting HD earlier rather than later.  If no increase in UO today then will get PC placed. Dennie Moltz T,MD 12/31/2013 9:43 AM

## 2014-01-01 DIAGNOSIS — N19 Unspecified kidney failure: Secondary | ICD-10-CM

## 2014-01-01 DIAGNOSIS — N184 Chronic kidney disease, stage 4 (severe): Secondary | ICD-10-CM

## 2014-01-01 LAB — RENAL FUNCTION PANEL
Albumin: 1.7 g/dL — ABNORMAL LOW (ref 3.5–5.2)
BUN: 76 mg/dL — ABNORMAL HIGH (ref 6–23)
CO2: 22 mEq/L (ref 19–32)
Calcium: 8.2 mg/dL — ABNORMAL LOW (ref 8.4–10.5)
Chloride: 97 mEq/L (ref 96–112)
Creatinine, Ser: 4.67 mg/dL — ABNORMAL HIGH (ref 0.50–1.10)
GFR calc Af Amer: 11 mL/min — ABNORMAL LOW (ref >90)
GFR calc non Af Amer: 10 mL/min — ABNORMAL LOW (ref >90)
Glucose, Bld: 119 mg/dL — ABNORMAL HIGH (ref 70–99)
Phosphorus: 7.6 mg/dL — ABNORMAL HIGH (ref 2.3–4.6)
Potassium: 3.6 mEq/L — ABNORMAL LOW (ref 3.7–5.3)
Sodium: 136 mEq/L — ABNORMAL LOW (ref 137–147)

## 2014-01-01 LAB — GLUCOSE, CAPILLARY
Glucose-Capillary: 131 mg/dL — ABNORMAL HIGH (ref 70–99)
Glucose-Capillary: 106 mg/dL — ABNORMAL HIGH (ref 70–99)
Glucose-Capillary: 99 mg/dL (ref 70–99)
Glucose-Capillary: 138 mg/dL — ABNORMAL HIGH (ref 70–99)
Glucose-Capillary: 163 mg/dL — ABNORMAL HIGH (ref 70–99)

## 2014-01-01 MED ORDER — CLONIDINE HCL 0.1 MG PO TABS
0.1000 mg | ORAL_TABLET | Freq: Three times a day (TID) | ORAL | Status: DC
  Administered 2014-01-01 – 2014-01-08 (×19): 0.1 mg via ORAL
  Filled 2014-01-01 (×24): qty 1

## 2014-01-01 NOTE — Progress Notes (Signed)
S: Pt seen at bedside. Language interpreter not present yet this morning. No acute change.  O:BP 182/85  Pulse 64  Temp(Src) 97.4 F (36.3 C) (Oral)  Resp 20  Ht 5\' 4"  (1.626 m)  Wt 226 lb 11.2 oz (102.83 kg)  BMI 38.89 kg/m2  SpO2 99%  Intake/Output Summary (Last 24 hours) at 01/01/14 0754 Last data filed at 01/01/14 0600  Gross per 24 hour  Intake    890 ml  Output   3050 ml  Net  -2160 ml  Output: about 3L 1/7 0700 to 1/8 0700.  Weight change: -4 lb 9.6 oz (-2.087 kg) Gen: initially sleeping, easily wakes, responds to simple miming communication CVS: RRR, no definite murmur appreciated Resp: soft basilar crackles but normal WOB on  Abd:+ BS, soft NTND Ext: bilateral symmetric 2-3+ edema to just below knees, essentially unchanged NEURO: Deaf, no obvious new deficit; very poor eyesight at baseline   . aspirin  325 mg Oral Daily  . calcium acetate  667 mg Oral TID WC  . darbepoetin (ARANESP) injection - NON-DIALYSIS  100 mcg Subcutaneous Q Tue-1800  . FLUoxetine  20 mg Oral Daily  . furosemide  160 mg Intravenous TID  . guaiFENesin  1,200 mg Oral BID  . heparin  5,000 Units Subcutaneous Q8H  . hydrALAZINE  100 mg Oral Q6H  . insulin aspart  0-5 Units Subcutaneous QHS  . insulin aspart  0-9 Units Subcutaneous TID WC  . metoprolol tartrate  100 mg Oral BID  . sodium chloride  3 mL Intravenous Q12H   No results found. BMET    Component Value Date/Time   NA 136* 01/01/2014 0401   K 3.6* 01/01/2014 0401   CL 97 01/01/2014 0401   CO2 22 01/01/2014 0401   GLUCOSE 119* 01/01/2014 0401   BUN 76* 01/01/2014 0401   CREATININE 4.67* 01/01/2014 0401   CREATININE 0.76 05/23/2011 0956   CALCIUM 8.2* 01/01/2014 0401   GFRNONAA 10* 01/01/2014 0401   GFRAA 11* 01/01/2014 0401   CBC    Component Value Date/Time   WBC 8.1 12/27/2013 0420   RBC 2.65* 12/27/2013 0420   HGB 8.0* 12/27/2013 0420   HCT 24.1* 12/27/2013 0420   PLT 363 12/27/2013 0420   MCV 90.9 12/27/2013 0420   MCH 30.2 12/27/2013 0420   MCHC 33.2 12/27/2013 0420   RDW 13.5 12/27/2013 0420   LYMPHSABS 3.4 12/25/2013 0553   MONOABS 0.5 12/25/2013 0553   EOSABS 0.1 12/25/2013 0553   BASOSABS 0.0 12/25/2013 0553     Assessment: 1. Acute on ? CKD with uncertain baseline. SPEP and FLC's unremarkable. UOP better 1/7-1/8 on high dose IV Lasix. Pt had vein mapping 1/7. Cr with some variation between 4.4-4.9 the past few days. 2. Hyperphosphatemia / sec hyperparathytroidism - PTH high 1/5. Started binders this admission 3. HTN / DM - per primary team 4. Anemia - Hb last checked 1/3. Iron studies suggest anemia of chronic disease, likely DM+HTN+CKD. Started Aranesp 1/6.  Plan: 1. Continue Lasix 160mg  TID IV and monitor output. Has had vein mapping and through interpreter has expressed she is willing to start HD if indicated. Will probably need access placed before discharge; per chart review SNF placement is pending. 2. Trend Cr and electrolytes, monitor UOP. 3. Continue PO4 binders  Please see also pending attending cosign for edits / additions.  Emmaline Kluver, MD PGY-2, [place] [facility] 01/01/2014, 7:54 AM I have seen and examined this patient and agree with  plan per Dr Venetia Maxon. UO has improved but I do not think this will be well managed medically and HD will be best approach.  To that end will ask [facility] to see pt for PC now with plans for AVF/AVG later after volume is better controlled.  It would be easiest to have surgeon address both these issues with pt while she is here in the hospital with an interpreter available.  BP high, will start clonidine but volume control will be best approach. Rosco Harriott T,MD 01/01/2014 10:08 AM

## 2014-01-01 NOTE — Progress Notes (Signed)
   Through a sign interpretator, the patient has agreed to proceed with the tunneled dialysis catheter placement tomorrow.  The patient is aware the risks of tunneled dialysis catheter placement include but are not limited to: bleeding, infection, central venous injury, pneumothorax, possible venous stenosis, possible malpositioning in the venous system, and possible infections related to Shehata-term catheter presence. The patient was aware of these risks and agreed to proceed.  Adele Barthel, MD [facility] Office: [phone] Pager: [phone]  01/01/2014, 3:20 PM

## 2014-01-01 NOTE — Progress Notes (Signed)
[facility] PROGRESS NOTE  [name] [id number] DOB: 1956/08/07 DOA: 12/22/2013 PCP: Pcp Not In System  Assessment/Plan: 1. Acute respiratory failure with hypoxia/Community acquired pneumonia: resolved.  No respiratory issues today. Has completed 8 d of Levaquin. continues on mucinex, nebs, 02. Flu negative. 2. Tachycardia/SVT: No further episodes.Not currently on tele. 3. HYPERTENSION, ESSENTIAL,  Remains UNCONTROLLED : Noncompliant as outpatient. Discontinued Norvasc during this admission due to edema. Increased Lopressor to 100 mg by mouth twice a day. Increased hydralazine 100 mg 4 times a day. Currently on Lasix 160 mg TID.  BP very elevated today. Will discuss with nephrology. 4. Severe concentric left ventricular hypertrophy/moderate pulmonary HTN/grade 1 diastolic dysfunction: Apparent new findings- no prior ECHO to compare - ?? HOCM -? of underlying amyloid. Yogi term uncontrolled hypertension.  Cardiology has seen and recommended outpatient followup.  5. Chronic bilateral LEG EDEMA: Improving with diureses. Discontinued Norvasc. Lasix started per renal.  6. ?? DM type 2 -previous HgA1c was 9.2 in 2012, this admit 5.9. At present has diet controlled DM and no indication to begin medication  7. CKD stage 4 : Renal US shows mild medical renal dz and no old Scr to compare. Renal function seems to be plataeuing, and now at 4.67. Patient started on diuretics per renal, weight is trending down slowly. May need to consider HD. 8. HYPERLIPIDEMIA  9. PSYCHIATRIC DISORDER/DEAF/Depression: Previously dx'd with psychotic disorder NOS in 2011. Not on meds pre admit. May have degree of developmental disability and combined with her hearing loss she certainly could develop acute psychotic sx's Per psych patient with capacity. Patient has been seen by psychiatry and patient has been started on Prozac for depression. Pt's daughter reports a hx of prior schizophrenia and is concerned that pt is  currently not being treated for this (had previously been treated by psych).  She seems to be stable at this time. 10. Hearing loss: sign language interpreter  Code Status: full Family Communication: spoke with patient via sign language interpreter  Disposition Plan: inpatient. Will dc to SNF when medically stable.   Consultants: Cardiology  Nephrology  Psychiatry   Procedures:  none  Antibiotics: Zithromax 12/29 >>> 12/27/13  Rocephin 12/29 >>> 12/27/13  levaquin 12/27/13>>>>12/30/13   HPI/Subjective: Patient feels tired today.  No specific complaints.  No respiratory difficulty.   Objective: Filed Vitals:   01/01/14 0334  BP: 182/85  Pulse: 64  Temp: 97.4 F (36.3 C)  Resp: 20    Intake/Output Summary (Last 24 hours) at 01/01/14 0743 Last data filed at 01/01/14 0600  Gross per 24 hour  Intake    890 ml  Output   3050 ml  Net  -2160 ml   Filed Weights   12/30/13 0418 12/31/13 0529 01/01/14 0334  Weight: 106.5 kg (234 lb 12.6 oz) 104.917 kg (231 lb 4.8 oz) 102.83 kg (226 lb 11.2 oz)    Exam:   General:  NAD  Cardiovascular: RRR  Respiratory: scattered rhonchi, good air movement  Abdomen: BS+, soft, non tender  Musculoskeletal: 1+ edema bilaterally  Data Reviewed: Basic Metabolic Panel:  Recent Labs Lab 12/28/13 0720 12/29/13 0450 12/30/13 0309 12/31/13 0310 01/01/14 0401  NA 137 138 134* 139 136*  K 4.7 4.5 5.3 4.5 3.6*  CL 106 104 100 102 97  CO2 19 19 17* 20 22  GLUCOSE 114* 119* 115* 101* 119*  BUN 69* 72* 74* 75* 76*  CREATININE 4.71* 4.77* 4.65* 4.90* 4.67*  CALCIUM 7.5* 7.9* 7.5* 7.7* 8.2*  PHOS 7.1* 7.4* 7.2* 7.2* 7.6*   Liver Function Tests:  Recent Labs Lab 12/28/13 0720 12/29/13 0450 12/30/13 0309 12/31/13 0310 01/01/14 0401  ALBUMIN 1.5* 1.5* 1.3* 1.5* 1.7*   No results found for this basename: LIPASE, AMYLASE,  in the last 168 hours No results found for this basename: AMMONIA,  in the last 168 hours CBC:  Recent  Labs Lab 12/27/13 0420  WBC 8.1  HGB 8.0*  HCT 24.1*  MCV 90.9  PLT 363   Cardiac Enzymes: No results found for this basename: CKTOTAL, CKMB, CKMBINDEX, TROPONINI,  in the last 168 hours BNP (last 3 results) No results found for this basename: PROBNP,  in the last 8760 hours CBG:  Recent Labs Lab 12/30/13 2110 12/31/13 0634 12/31/13 1131 12/31/13 1714 01/01/14 0605  GLUCAP 140* 107* 116* 120* 106*    Recent Results (from the past 240 hour(s))  CULTURE, BLOOD (ROUTINE X 2)     Status: None   Collection Time    12/22/13 10:28 PM      Result Value Range Status   Specimen Description BLOOD RIGHT HAND   Final   Special Requests BOTTLES DRAWN AEROBIC ONLY 3CC   Final   Culture  Setup Time     Final   Value: Jan 30, 202014 04:36     Performed at [facility]   Culture     Final   Value: NO GROWTH 5 DAYS     Performed at [facility]   Report Status 12/29/2013 FINAL   Final  CULTURE, BLOOD (ROUTINE X 2)     Status: None   Collection Time    12/22/13 10:40 PM      Result Value Range Status   Specimen Description BLOOD LEFT HAND   Final   Special Requests BOTTLES DRAWN AEROBIC ONLY [facility]   Final   Culture  Setup Time     Final   Value: Jan 30, 202014 04:36     Performed at [facility]   Culture     Final   Value: NO GROWTH 5 DAYS     Performed at [facility]   Report Status 12/29/2013 FINAL   Final  MRSA PCR SCREENING     Status: None   Collection Time    12/23/13  2:24 AM      Result Value Range Status   MRSA by PCR NEGATIVE  NEGATIVE Final   Comment:            The GeneXpert MRSA Assay (FDA     approved for NASAL specimens     only), is one component of a     comprehensive MRSA colonization     surveillance program. It is not     intended to diagnose MRSA     infection nor to guide or     monitor treatment for     MRSA infections.  CULTURE, BLOOD (ROUTINE X 2)     Status: None   Collection Time    12/23/13  4:25 AM      Result  Value Range Status   Specimen Description BLOOD RIGHT ANTECUBITAL   Final   Special Requests BOTTLES DRAWN AEROBIC ONLY 5CC   Final   Culture  Setup Time     Final   Value: Jan 30, 202014 08:58     Performed at [facility]   Culture     Final   Value: NO GROWTH 5 DAYS     Performed at [facility]   Report Status 12/29/2013 FINAL   Final  CULTURE, BLOOD (ROUTINE X 2)     Status: None   Collection Time    12/23/13  4:30 AM      Result Value Range Status   Specimen Description BLOOD RIGHT HAND   Final   Special Requests BOTTLES DRAWN AEROBIC ONLY 3CC   Final   Culture  Setup Time     Final   Value: 03-08-202014 08:58     Performed at [facility]   Culture     Final   Value: NO GROWTH 5 DAYS     Performed at [facility]   Report Status 12/29/2013 FINAL   Final  URINE CULTURE     Status: None   Collection Time    12/23/13 10:11 AM      Result Value Range Status   Specimen Description URINE, RANDOM   Final   Special Requests NONE   Final   Culture  Setup Time     Final   Value: 03-08-202014 17:57     Performed at [place]     Final   Value: NO GROWTH     Performed at [facility]   Culture     Final   Value: NO GROWTH     Performed at [facility]   Report Status 12/24/2013 FINAL   Final     Studies: No results found.  Scheduled Meds: . aspirin  325 mg Oral Daily  . calcium acetate  667 mg Oral TID WC  . darbepoetin (ARANESP) injection - NON-DIALYSIS  100 mcg Subcutaneous Q Tue-1800  . FLUoxetine  20 mg Oral Daily  . furosemide  160 mg Intravenous TID  . guaiFENesin  1,200 mg Oral BID  . heparin  5,000 Units Subcutaneous Q8H  . hydrALAZINE  100 mg Oral Q6H  . insulin aspart  0-5 Units Subcutaneous QHS  . insulin aspart  0-9 Units Subcutaneous TID WC  . metoprolol tartrate  100 mg Oral BID  . sodium chloride  3 mL Intravenous Q12H   Continuous Infusions:   Principal Problem:   Acute  respiratory failure with hypoxia Active Problems:   HYPERLIPIDEMIA   PSYCHIATRIC DISORDER   DEAF MUTISM   HYPERTENSION, ESSENTIAL, UNCONTROLLED   LEG EDEMA   Community acquired pneumonia   DM type 2 causing CKD stage 4   Acute renal failure   SVT (supraventricular tachycardia)   NSVT (nonsustained ventricular tachycardia)   Abnormal echocardiogram   Chronic diastolic CHF (congestive heart failure)   Anemia   Constipation    Time spent: 30 minutes   [facility] Pager [phone] If 7PM-7AM, please contact night-coverage at [email or web address], password [facility] 01/01/2014, 7:43 AM  LOS: 10 days

## 2014-01-01 NOTE — Consult Note (Signed)
Referred by:  Dr. Mercy Moore  Reason for referral: New access  History of Present Illness  [name] is a 58 y.o. (1956/07/21) female who presents for evaluation for permanent access.  This patient is nearly blind and deaf.  No sign interpreter is present at bedside.  History is reconstructed from chart.  The patient is being managed by Nephrology for acute on chronic renal failure.  From chart review, she has had significant fluid overload issues and psychiatry issues raised while admitted.  At this point, Nephrology and Internal Medicine agree that she needs to be started on hemodialysis.  No evidence of prior PPM placement of central venous catherization is documented.  Past Medical History  Diagnosis Date  . Hyperlipidemia   . Hypertension     a. Has previously refused blood pressure meds.   . Deaf     Divorced from husband but lives with him. Has daughter but she does not care for her.  . Bilateral leg edema     a. Chronic.  Marland Kitchen Psychiatric disorder     She frequently exhibits paranoia and has been diagnosed with psychotic d/o NOS during hospital stay in the past. She is tangetial and perseverative during her visits. She apparently has had a bad experience with mental health in [place] in the past and refuses to discuss mental issues for fear that she will be sent back there. Her paranoia, communication issues, financial woes and lack of fam  . Fibroid uterus   . Anemia     Due to fibroids.  . Heart murmur   . Diabetes mellitus     a. Per PCP note 2012 (A1C 9.2) - pt unwilling to take meds and was educated on risk of uncontrolled DM.  b. A1C 5.9 in 11/2013.    Past Surgical History  Procedure Laterality Date  . No past surgeries      History   Social History  . Marital Status: Married    Spouse Name: N/A    Number of Children: N/A  . Years of Education: N/A   Occupational History  . Not on file.   Social History Main Topics  . Smoking status: Never Smoker   .  Smokeless tobacco: Not on file     Comment: Prior dip  . Alcohol Use: No  . Drug Use: No  . Sexual Activity: Not on file   Other Topics Concern  . Not on file   Social History Narrative  . No narrative on file    Family History  Problem Relation Age of Onset  . Other Father     Drowned from fishing?  . Heart disease      Current Facility-Administered Medications  Medication Dose Route Frequency Provider Last Rate Last Dose  . acetaminophen (TYLENOL) tablet 650 mg  650 mg Oral Q6H PRN Bynum Bellows, MD   650 mg at 12/26/13 [id number]   Or  . acetaminophen (TYLENOL) suppository 650 mg  650 mg Rectal Q6H PRN Bynum Bellows, MD      . aspirin tablet 325 mg  325 mg Oral Daily Bynum Bellows, MD   325 mg at 01/01/14 1027  . bisacodyl (DULCOLAX) suppository 10 mg  10 mg Rectal Daily PRN Donne Hazel, MD   10 mg at 12/24/13 2149  . calcium acetate (PHOSLO) capsule 667 mg  667 mg Oral TID WC Windy Kalata, MD   667 mg at 01/01/14 1028  . cloNIDine (CATAPRES) tablet 0.1  mg  0.1 mg Oral TID Markle, MD   0.1 mg at 01/01/14 1100  . darbepoetin (ARANESP) injection 100 mcg  100 mcg Subcutaneous Q Tue-1800 Windy Kalata, MD   100 mcg at 12/30/13 1737  . FLUoxetine (PROZAC) capsule 20 mg  20 mg Oral Daily Durward Parcel, MD   20 mg at 01/01/14 1027  . furosemide (LASIX) 160 mg in dextrose 5 % 50 mL IVPB  160 mg Intravenous TID Windy Kalata, MD   160 mg at 01/01/14 1030  . guaiFENesin (MUCINEX) 12 hr tablet 1,200 mg  1,200 mg Oral BID Eugenie Filler, MD   1,200 mg at 01/01/14 1027  . heparin injection 5,000 Units  5,000 Units Subcutaneous Q8H Bynum Bellows, MD   5,000 Units at 01/01/14 1432  . hydrALAZINE (APRESOLINE) injection 10 mg  10 mg Intravenous Q6H PRN Bynum Bellows, MD   10 mg at 12/29/13 1733  . hydrALAZINE (APRESOLINE) tablet 100 mg  100 mg Oral Q6H Eugenie Filler, MD   100 mg at 01/01/14 1432  . insulin aspart (novoLOG) injection 0-5  Units  0-5 Units Subcutaneous QHS Bynum Bellows, MD      . insulin aspart (novoLOG) injection 0-9 Units  0-9 Units Subcutaneous TID WC Bynum Bellows, MD   1 Units at 12/29/13 1733  . ipratropium (ATROVENT) nebulizer solution 0.5 mg  0.5 mg Nebulization Q2H PRN Eugenie Filler, MD      . levalbuterol [facility]) nebulizer solution 0.63 mg  0.63 mg Nebulization Q2H PRN Eugenie Filler, MD      . metoprolol (LOPRESSOR) tablet 100 mg  100 mg Oral BID Eugenie Filler, MD   100 mg at 01/01/14 1029  . naphazoline-pheniramine (NAPHCON-A) 0.025-0.3 % ophthalmic solution 1 drop  1 drop Both Eyes QID PRN Donne Hazel, MD   1 drop at 12/25/13 1518  . ondansetron (ZOFRAN) tablet 4 mg  4 mg Oral Q6H PRN Bynum Bellows, MD       Or  . ondansetron (ZOFRAN) injection 4 mg  4 mg Intravenous Q6H PRN Bynum Bellows, MD      . sodium chloride 0.9 % injection 3 mL  3 mL Intravenous Q12H Bynum Bellows, MD   3 mL at 01/01/14 1000    No Known Allergies  REVIEW OF SYSTEMS:  (Positives checked otherwise negative)  Cannot be obtained due to communication barriers (deaf and blind)   Physical Examination  Filed Vitals:   12/31/13 1330 12/31/13 1956 01/01/14 0334 01/01/14 1345  BP: 160/69 168/85 182/85 185/84  Pulse: 75 68 64 68  Temp: 97.4 F (36.3 C) 97.5 F (36.4 C) 97.4 F (36.3 C) 97.6 F (36.4 C)  TempSrc: Oral Oral Oral Oral  Resp: 18 18 20 18   Height:      Weight:   226 lb 11.2 oz (102.83 kg)   SpO2: 99% 98% 99% 98%   Body mass index is 38.89 kg/(m^2).  General: Alert, non-communicative, WD, morbidly obese  Head: [place]/AT  Ear/Nose/Throat: Hearing grossly not intact, nares w/o erythema or drainage, oropharynx not examined as pt cannot not cooperate with instructions  Eyes: Pupils somewhat reactive, cannot get EOMI though she tracks somewhat  Neck: Supple, no nuchal rigidity, no palpable LAD  Pulmonary: Sym exp, good air movt, CTAB, no wheezing, + rales and rhonchi,   Cardiac: RRR, Nl  S1, S2, faint Murmurs, no rubs or gallops  Vascular: Vessel Right Left  Radial Palpable Palpable (stronger than right)  Ulnar Faintly Palpable Faintly Palpable  Brachial Palpable Palpable  Carotid Palpable, without bruit Palpable, without bruit  Aorta Not palpable due obesity N/A  Femoral Palpable Palpable  Popliteal Not palpable Not palpable  PT Not Palpable Not Palpable  DP Not Palpable Not Palpable   Gastrointestinal: soft, NTND, -G/R, - HSM, - masses, - CVAT B, large pannus  Musculoskeletal: cannot not get patient to cooperate with test due to lack of communication , BLE have evidence of chronic venous stasis dermatitis, 1+ edema  Neurologic: cannot not get patient to cooperate with test due to lack of communication  Psychiatric: cannot communicate with patient to test  Dermatologic: See M/S exam for extremity exam, no rashes otherwise noted  Lymph : No Cervical, Axillary, or Inguinal lymphadenopathy   Non-Invasive Vascular Imaging  Vein Mapping  (Date: 01/01/2014):   R arm: acceptable vein conduits include none  L arm: acceptable vein conduits include none  Laboratory: CBC:    Component Value Date/Time   WBC 8.1 12/27/2013 0420   RBC 2.65* 12/27/2013 0420   HGB 8.0* 12/27/2013 0420   HCT 24.1* 12/27/2013 0420   PLT 363 12/27/2013 0420   MCV 90.9 12/27/2013 0420   MCH 30.2 12/27/2013 0420   MCHC 33.2 12/27/2013 0420   RDW 13.5 12/27/2013 0420   LYMPHSABS 3.4 12/25/2013 0553   MONOABS 0.5 12/25/2013 0553   EOSABS 0.1 12/25/2013 0553   BASOSABS 0.0 12/25/2013 0553    BMP:    Component Value Date/Time   NA 136* 01/01/2014 0401   K 3.6* 01/01/2014 0401   CL 97 01/01/2014 0401   CO2 22 01/01/2014 0401   GLUCOSE 119* 01/01/2014 0401   BUN 76* 01/01/2014 0401   CREATININE 4.67* 01/01/2014 0401   CREATININE 0.76 05/23/2011 0956   CALCIUM 8.2* 01/01/2014 0401   GFRNONAA 10* 01/01/2014 0401   GFRAA 11* 01/01/2014 0401    Coagulation: Lab Results  Component Value Date   INR 0.89 01/11/2011   No  results found for this basename: PTT   Radiology: CXR (12/26/13):  Decreased conspicuity of the perihilar opacities likely reflecting pulmonary edema.   No new focal regions of consolidation or new focal infiltrates. Note   a component of the decreased conspicuity on the lung findings likely secondary to increased lung volumes when compared to previous study.    Echo (12/23/13) Study Conclusions  - Left ventricle: Small LV cavity. There was severe concentric hypertrophy (septum and posterior walls measure 1.8-1.9 cm respectively). There is a prominent subendocardial "stripe" - this could suggest infiltrate process such as amyloidosis (consider cardiac MRI if clinically indicated). LV apical false tendon. Systolic function was vigorous. The estimated ejection fraction was in the range of 65% to 70%. Doppler parameters are consistent with abnormal left ventricular relaxation (grade 1 diastolic dysfunction). The E/e' ratio is >20, suggesting markedly elevated LV filling pressure. - Aortic valve: Sclerosis without stenosis. Noregurgitation. - Aorta: The aortic root was mildly calcified. - Mitral valve: Moderate posterior MAC. Mild regurgitation. - Left atrium: The atrium was mildly dilated (area 21 cm2). - Atrial septum: There was increased thickness of the septum, consistent with lipomatous hypertrophy. - Tricuspid valve: Poorly visualized. Mild regurgitation. - Pulmonary arteries: PA peak pressure: 39mm Hg (S). - Systemic veins: IVC is poorly visualized. - Pericardium, extracardiac: There was no pericardialeffusion.  Medical Decision Making  BEENA ELEBY is a 58 y.o. female who presents with acute on chronic renal failure, HTN with diasystolic  dysfunction, deafness, blindness, mood disorders, and h/o psychotic disorders   Realistically, no way to communicate effectively with this patient.  No interpreter present at bed side.  Will schedule patient for [facility] tomorrow, but not certain how  consent can be obtained.    Based on vein mapping and examination, this patient's permanent access options include: likely BUE AVG.  I would proceed with L AVG given better distal flow in L hand.  I can't communicate with the patient, so I don't know her hand dominance.  I would also remap her arms to see the size of her brachial veins to see if a brachial transposition is a possibility given the otherwise lack of fistula options.  I will order this study again.  Adele Barthel, MD [facility] Office: [phone] Pager: [phone]  01/01/2014, 2:55 PM

## 2014-01-01 NOTE — Progress Notes (Signed)
CSW continuing to follow patient for disposition. CSW updated [place], [place].  Jeanette Caprice, MSW, [place]

## 2014-01-01 NOTE — Progress Notes (Signed)
Right  Upper Extremity Vein Map    Right Brachial- lateral/ anterior-lateral  Segment Diameter Depth Comment  1. Origin 4.45mm 50mm   2. Mid upper arm 5.8mm 26.22mm   3. Above [facility] 4.43mm 22.39mm     Right Brachial- medial/ posterior-medial  Segment Diameter Depth Comment  1. Origin 3.72mm 31.59mm   2. Mid upper arm 4.59mm 29.71mm   3. Above [facility] 4.24mm 27.37mm     Left Upper Extremity Vein Map    Left Brachial- anterior/ anterior-medial  Segment Diameter Depth Comment  1. Origin 4.31mm 29.38mm   2. Mid upper arm 4.75mm 29.53mm   3. Above [facility] 3.60mm 27.64mm     Left Brachial- posterior  Segment Diameter Depth Comment  1. Origin 4.22mm 34.31mm   2. Mid upper arm 2.51mm 34.28mm   3. Above [facility]   Unable to visualize   01/01/2014 6:04 PM Maudry Mayhew, RVT, RDCS, RDMS

## 2014-01-02 ENCOUNTER — Encounter (HOSPITAL_COMMUNITY)
Admission: AD | Disposition: A | Discharge status: Skilled Nursing Facility | Payer: Self-pay | Source: Ambulatory Visit | Attending: Internal Medicine

## 2014-01-02 ENCOUNTER — Encounter (HOSPITAL_COMMUNITY): Payer: Medicare Other | Admitting: Anesthesiology

## 2014-01-02 ENCOUNTER — Inpatient Hospital Stay (HOSPITAL_COMMUNITY): Payer: Medicare Other

## 2014-01-02 ENCOUNTER — Telehealth: Payer: Self-pay | Admitting: Vascular Surgery

## 2014-01-02 ENCOUNTER — Inpatient Hospital Stay (HOSPITAL_COMMUNITY): Payer: Medicare Other | Admitting: Anesthesiology

## 2014-01-02 DIAGNOSIS — N184 Chronic kidney disease, stage 4 (severe): Secondary | ICD-10-CM | POA: Diagnosis not present

## 2014-01-02 HISTORY — PX: AV FISTULA PLACEMENT: SHX1204

## 2014-01-02 HISTORY — PX: INSERTION OF DIALYSIS CATHETER: SHX1324

## 2014-01-02 LAB — RENAL FUNCTION PANEL
Albumin: 1.4 g/dL — ABNORMAL LOW (ref 3.5–5.2)
BUN: 77 mg/dL — ABNORMAL HIGH (ref 6–23)
CO2: 21 mEq/L (ref 19–32)
Calcium: 7.7 mg/dL — ABNORMAL LOW (ref 8.4–10.5)
Chloride: 99 mEq/L (ref 96–112)
Creatinine, Ser: 4.72 mg/dL — ABNORMAL HIGH (ref 0.50–1.10)
GFR calc Af Amer: 11 mL/min — ABNORMAL LOW (ref >90)
GFR calc non Af Amer: 9 mL/min — ABNORMAL LOW (ref >90)
Glucose, Bld: 115 mg/dL — ABNORMAL HIGH (ref 70–99)
Phosphorus: 7.5 mg/dL — ABNORMAL HIGH (ref 2.3–4.6)
Potassium: 3.2 mEq/L — ABNORMAL LOW (ref 3.7–5.3)
Sodium: 136 mEq/L — ABNORMAL LOW (ref 137–147)

## 2014-01-02 LAB — GLUCOSE, CAPILLARY
Glucose-Capillary: 109 mg/dL — ABNORMAL HIGH (ref 70–99)
Glucose-Capillary: 102 mg/dL — ABNORMAL HIGH (ref 70–99)
Glucose-Capillary: 121 mg/dL — ABNORMAL HIGH (ref 70–99)
Glucose-Capillary: 126 mg/dL — ABNORMAL HIGH (ref 70–99)
Glucose-Capillary: 149 mg/dL — ABNORMAL HIGH (ref 70–99)

## 2014-01-02 LAB — HEPATITIS B SURFACE ANTIGEN: Hepatitis B Surface Ag: NEGATIVE

## 2014-01-02 SURGERY — INSERTION OF DIALYSIS CATHETER
Anesthesia: General | Site: Neck | Laterality: Right

## 2014-01-02 MED ORDER — PHENYLEPHRINE HCL 10 MG/ML IJ SOLN
INTRAMUSCULAR | Status: DC | PRN
  Administered 2014-01-02 (×2): 80 ug via INTRAVENOUS

## 2014-01-02 MED ORDER — FENTANYL CITRATE 0.05 MG/ML IJ SOLN
INTRAMUSCULAR | Status: DC | PRN
  Administered 2014-01-02: 75 ug via INTRAVENOUS
  Administered 2014-01-02: 50 ug via INTRAVENOUS

## 2014-01-02 MED ORDER — HEPARIN SODIUM (PORCINE) 1000 UNIT/ML IJ SOLN
INTRAMUSCULAR | Status: DC | PRN
  Administered 2014-01-02: 5000 [IU]

## 2014-01-02 MED ORDER — 0.9 % SODIUM CHLORIDE (POUR BTL) OPTIME
TOPICAL | Status: DC | PRN
  Administered 2014-01-02: 1000 mL

## 2014-01-02 MED ORDER — PROPOFOL 10 MG/ML IV BOLUS
INTRAVENOUS | Status: DC | PRN
Start: 2014-01-02 — End: 2014-01-02
  Administered 2014-01-02: 100 mg via INTRAVENOUS

## 2014-01-02 MED ORDER — GLYCOPYRROLATE 0.2 MG/ML IJ SOLN
INTRAMUSCULAR | Status: DC | PRN
  Administered 2014-01-02: .4 mg via INTRAVENOUS

## 2014-01-02 MED ORDER — DEXTROSE 5 % IV SOLN
1.5000 g | INTRAVENOUS | Status: AC
  Filled 2014-01-02: qty 1.5

## 2014-01-02 MED ORDER — PNEUMOCOCCAL VAC POLYVALENT 25 MCG/0.5ML IJ INJ
0.5000 mL | INJECTION | INTRAMUSCULAR | Status: AC
  Administered 2014-01-03: 0.5 mL via INTRAMUSCULAR
  Filled 2014-01-02: qty 0.5

## 2014-01-02 MED ORDER — HEPARIN SODIUM (PORCINE) 1000 UNIT/ML IJ SOLN
INTRAMUSCULAR | Status: AC
  Filled 2014-01-02: qty 1

## 2014-01-02 MED ORDER — SODIUM CHLORIDE 0.9 % IR SOLN
Status: DC | PRN
  Administered 2014-01-02: 13:00:00

## 2014-01-02 MED ORDER — OXYCODONE-ACETAMINOPHEN 5-325 MG PO TABS: 1.0000 | ORAL_TABLET | ORAL | Status: DC | PRN

## 2014-01-02 MED ORDER — ONDANSETRON HCL 4 MG/2ML IJ SOLN
INTRAMUSCULAR | Status: DC | PRN
  Administered 2014-01-02: 4 mg via INTRAVENOUS

## 2014-01-02 MED ORDER — SODIUM CHLORIDE 0.9 % IV SOLN
INTRAVENOUS | Status: DC | PRN
  Administered 2014-01-02: 11:00:00 via INTRAVENOUS

## 2014-01-02 MED ORDER — INFLUENZA VAC SPLIT QUAD 0.5 ML IM SUSP
0.5000 mL | INTRAMUSCULAR | Status: AC
  Administered 2014-01-03: 0.5 mL via INTRAMUSCULAR
  Filled 2014-01-02: qty 0.5

## 2014-01-02 MED ORDER — EPHEDRINE SULFATE 50 MG/ML IJ SOLN
INTRAMUSCULAR | Status: DC | PRN
  Administered 2014-01-02 (×2): 10 mg via INTRAVENOUS

## 2014-01-02 MED ORDER — HYDROMORPHONE HCL PF 1 MG/ML IJ SOLN: 0.2500 mg | INTRAMUSCULAR | Status: DC | PRN

## 2014-01-02 MED ORDER — ONDANSETRON HCL 4 MG/2ML IJ SOLN: 4.0000 mg | Freq: Once | INTRAMUSCULAR | Status: DC | PRN

## 2014-01-02 MED ORDER — ROCURONIUM BROMIDE 100 MG/10ML IV SOLN
INTRAVENOUS | Status: DC | PRN
  Administered 2014-01-02: 50 mg via INTRAVENOUS

## 2014-01-02 MED ORDER — NEOSTIGMINE METHYLSULFATE 1 MG/ML IJ SOLN
INTRAMUSCULAR | Status: DC | PRN
  Administered 2014-01-02: 4 mg via INTRAVENOUS

## 2014-01-02 MED ORDER — DEXTROSE 5 % IV SOLN
1.5000 g | INTRAVENOUS | Status: DC | PRN
  Administered 2014-01-02: 1.5 g via INTRAVENOUS

## 2014-01-02 SURGICAL SUPPLY — 73 items
ADH SKN CLS APL DERMABOND .7 (GAUZE/BANDAGES/DRESSINGS) ×2
APL SKNCLS STERI-STRIP NONHPOA (GAUZE/BANDAGES/DRESSINGS) ×2
ARMBAND PINK RESTRICT EXTREMIT (MISCELLANEOUS) ×3 IMPLANT
BAG DECANTER FOR FLEXI CONT (MISCELLANEOUS) ×3 IMPLANT
BENZOIN TINCTURE PRP APPL 2/3 (GAUZE/BANDAGES/DRESSINGS) ×3 IMPLANT
CANISTER SUCTION 2500CC (MISCELLANEOUS) ×3 IMPLANT
CANNULA VESSEL 3MM 2 BLNT TIP (CANNULA) ×3 IMPLANT
CATH CANNON HEMO 15F 50CM (CATHETERS) IMPLANT
CATH CANNON HEMO 15FR 19 (HEMODIALYSIS SUPPLIES) IMPLANT
CATH CANNON HEMO 15FR 23CM (HEMODIALYSIS SUPPLIES) IMPLANT
CATH CANNON HEMO 15FR 31CM (HEMODIALYSIS SUPPLIES) IMPLANT
CATH CANNON HEMO 15FR 32CM (HEMODIALYSIS SUPPLIES) ×3 IMPLANT
CLIP LIGATING EXTRA MED SLVR (CLIP) ×6 IMPLANT
CLIP LIGATING EXTRA SM BLUE (MISCELLANEOUS) ×6 IMPLANT
CLSR STERI-STRIP ANTIMIC 1/2X4 (GAUZE/BANDAGES/DRESSINGS) ×3 IMPLANT
COVER PROBE W GEL 5X96 (DRAPES) IMPLANT
COVER SURGICAL LIGHT HANDLE (MISCELLANEOUS) ×6 IMPLANT
DECANTER SPIKE VIAL GLASS SM (MISCELLANEOUS) IMPLANT
DERMABOND ADVANCED (GAUZE/BANDAGES/DRESSINGS) ×1
DERMABOND ADVANCED .7 DNX12 (GAUZE/BANDAGES/DRESSINGS) ×2 IMPLANT
DRAPE C-ARM 42X72 X-RAY (DRAPES) ×3 IMPLANT
DRAPE CHEST BREAST 15X10 FENES (DRAPES) ×3 IMPLANT
DRAPE ORTHO SPLIT 77X108 STRL (DRAPES) ×3
DRAPE SURG 17X23 STRL (DRAPES) ×3 IMPLANT
DRAPE SURG ORHT 6 SPLT 77X108 (DRAPES) ×2 IMPLANT
DRSG TEGADERM 4X4.75 (GAUZE/BANDAGES/DRESSINGS) ×3 IMPLANT
ELECT REM PT RETURN 9FT ADLT (ELECTROSURGICAL) ×3
ELECTRODE REM PT RTRN 9FT ADLT (ELECTROSURGICAL) ×2 IMPLANT
GAUZE SPONGE 2X2 8PLY STRL LF (GAUZE/BANDAGES/DRESSINGS) ×2 IMPLANT
GAUZE SPONGE 4X4 16PLY XRAY LF (GAUZE/BANDAGES/DRESSINGS) ×6 IMPLANT
GEL ULTRASOUND 20GR AQUASONIC (MISCELLANEOUS) IMPLANT
GLOVE BIOGEL PI IND STRL 6.5 (GLOVE) ×2 IMPLANT
GLOVE BIOGEL PI IND STRL 7.5 (GLOVE) ×2 IMPLANT
GLOVE BIOGEL PI INDICATOR 6.5 (GLOVE) ×1
GLOVE BIOGEL PI INDICATOR 7.5 (GLOVE) ×1
GLOVE SS BIOGEL STRL SZ 7.5 (GLOVE) ×6 IMPLANT
GLOVE SUPERSENSE BIOGEL SZ 7.5 (GLOVE) ×3
GOWN STRL REUS W/ TWL LRG LVL3 (GOWN DISPOSABLE) ×6 IMPLANT
GOWN STRL REUS W/TWL LRG LVL3 (GOWN DISPOSABLE) ×9
GOWN STRL REUS W/TWL XL LVL3 (GOWN DISPOSABLE) ×9 IMPLANT
KIT BASIN OR (CUSTOM PROCEDURE TRAY) ×3 IMPLANT
KIT ROOM TURNOVER OR (KITS) ×3 IMPLANT
LOOP VESSEL MAXI BLUE (MISCELLANEOUS) ×3 IMPLANT
LOOP VESSEL MINI RED (MISCELLANEOUS) ×3 IMPLANT
MARKER SKIN DUAL TIP RULER LAB (MISCELLANEOUS) ×6 IMPLANT
NEEDLE 18GX1X1/2 (RX/OR ONLY) (NEEDLE) ×3 IMPLANT
NEEDLE 22X1 1/2 (OR ONLY) (NEEDLE) ×3 IMPLANT
NEEDLE HYPO 25GX1X1/2 BEV (NEEDLE) ×6 IMPLANT
NS IRRIG 1000ML POUR BTL (IV SOLUTION) ×3 IMPLANT
PACK CV ACCESS (CUSTOM PROCEDURE TRAY) ×3 IMPLANT
PACK SURGICAL SETUP 50X90 (CUSTOM PROCEDURE TRAY) ×3 IMPLANT
PAD ARMBOARD 7.5X6 YLW CONV (MISCELLANEOUS) ×12 IMPLANT
SOAP 2 % CHG 4 OZ (WOUND CARE) ×3 IMPLANT
SPONGE GAUZE 2X2 STER 10/PKG (GAUZE/BANDAGES/DRESSINGS) ×1
SPONGE GAUZE 4X4 12PLY (GAUZE/BANDAGES/DRESSINGS) ×3 IMPLANT
SPONGE LAP 18X18 X RAY DECT (DISPOSABLE) ×3 IMPLANT
STRIP CLOSURE SKIN 1/2X4 (GAUZE/BANDAGES/DRESSINGS) ×3 IMPLANT
SUT ETHILON 3 0 PS 1 (SUTURE) ×3 IMPLANT
SUT PROLENE 6 0 CC (SUTURE) ×3 IMPLANT
SUT VIC AB 3-0 SH 27 (SUTURE) ×3
SUT VIC AB 3-0 SH 27X BRD (SUTURE) ×2 IMPLANT
SUT VICRYL 4-0 PS2 18IN ABS (SUTURE) ×3 IMPLANT
SYR 20CC LL (SYRINGE) ×3 IMPLANT
SYR 30ML LL (SYRINGE) IMPLANT
SYR 5ML LL (SYRINGE) ×6 IMPLANT
SYR BULB IRRIGATION 50ML (SYRINGE) ×3 IMPLANT
SYR CONTROL 10ML LL (SYRINGE) ×3 IMPLANT
SYRINGE 10CC LL (SYRINGE) ×3 IMPLANT
TAPE CLOTH SURG 4X10 WHT LF (GAUZE/BANDAGES/DRESSINGS) ×3 IMPLANT
TOWEL OR 17X24 6PK STRL BLUE (TOWEL DISPOSABLE) ×9 IMPLANT
TOWEL OR 17X26 10 PK STRL BLUE (TOWEL DISPOSABLE) ×6 IMPLANT
UNDERPAD 30X30 INCONTINENT (UNDERPADS AND DIAPERS) ×3 IMPLANT
WATER STERILE IRR 1000ML POUR (IV SOLUTION) ×3 IMPLANT

## 2014-01-02 NOTE — Anesthesia Procedure Notes (Signed)
Procedure Name: Intubation Date/Time: 01/02/2014 11:27 AM Performed by: Izora Gala Pre-anesthesia Checklist: Patient identified, Emergency Drugs available and Suction available Patient Re-evaluated:Patient Re-evaluated prior to inductionOxygen Delivery Method: Circle system utilized Preoxygenation: Pre-oxygenation with 100% oxygen Intubation Type: IV induction Ventilation: Mask ventilation without difficulty and Oral airway inserted - appropriate to patient size Laryngoscope Size: Miller and 3 Grade View: Grade I Tube type: Oral Tube size: 7.5 mm Number of attempts: 1 Airway Equipment and Method: Stylet

## 2014-01-02 NOTE — Anesthesia Preprocedure Evaluation (Addendum)
Anesthesia Evaluation  Patient identified by MRN, date of birth, ID band Patient awake    Reviewed: Allergy & Precautions, H&P , NPO status , Patient's Chart, lab work & pertinent test results  History of Anesthesia Complications Negative for: history of anesthetic complications  Airway Mallampati: II TM Distance: >3 FB Neck ROM: Full    Dental  (+) Teeth Intact   Pulmonary  Pulmonary edema in setting of renal failure on nasal cannula. Non productive cough.   Diffuse crackles worse in base        Cardiovascular Exercise Tolerance: Poor hypertension, Pt. on medications and Pt. on home beta blockers Rhythm:Regular Rate:Normal  LVH, normal EF, mild MR   Neuro/Psych H/o paranoia. Hearing impaired uses sign language to communicate. Visually impaired.     GI/Hepatic negative GI ROS, Neg liver ROS,   Endo/Other  diabetes, Type 2  Renal/GU CRF and ESRFRenal disease     Musculoskeletal   Abdominal (+) + obese,   Peds  Hematology  (+) anemia ,   Anesthesia Other Findings   Reproductive/Obstetrics                          Anesthesia Physical Anesthesia Plan  ASA: IV  Anesthesia Plan: General   Post-op Pain Management:    Induction: Intravenous  Airway Management Planned: Oral ETT  Additional Equipment: None  Intra-op Plan:   Post-operative Plan: Extubation in OR  Informed Consent: I have reviewed the patients History and Physical, chart, labs and discussed the procedure including the risks, benefits and alternatives for the proposed anesthesia with the patient or authorized representative who has indicated his/her understanding and acceptance.   Dental advisory given  Plan Discussed with: CRNA and Surgeon  Anesthesia Plan Comments:         Anesthesia Quick Evaluation

## 2014-01-02 NOTE — Progress Notes (Signed)
[facility]  S: Language interpreter not present yet this morning. No acute change. Per nursing, several family members visited last night, asked questions about medications (?want change from Prozac to Abilify) but had no specific questions about dialysis. Per nursing, pt scheduled for tunneled cath placement around noon.  O:BP 158/66  Pulse 64  Temp(Src) 97.9 F (36.6 C) (Oral)  Resp 20  Ht 5\' 4"  (1.626 m)  Wt 223 lb 3.2 oz (101.243 kg)  BMI 38.29 kg/m2  SpO2 100%  Intake/Output Summary (Last 24 hours) at 01/02/14 0810 Last data filed at 01/01/14 2221  Gross per 24 hour  Intake    240 ml  Output   2500 ml  Net  -2260 ml  Output: about 2.5L 1/8 0700 to 1/9 0700.  Weight change: -3 lb 8 oz (-1.588 kg) Gen: initially sleeping, easily wakes, responds to simple miming communication CVS: RRR, no definite murmur appreciated Resp: soft basilar crackles but normal WOB on [place] Abd:+ BS, soft NTND Ext: slightly improve bilateral symmetric 2+ edema to just below knees NEURO: Deaf, no obvious new deficit; very poor eyesight at baseline   . aspirin  325 mg Oral Daily  . calcium acetate  667 mg Oral TID WC  . cloNIDine  0.1 mg Oral TID  . darbepoetin (ARANESP) injection - NON-DIALYSIS  100 mcg Subcutaneous Q Tue-1800  . FLUoxetine  20 mg Oral Daily  . furosemide  160 mg Intravenous TID  . guaiFENesin  1,200 mg Oral BID  . heparin  5,000 Units Subcutaneous Q8H  . hydrALAZINE  100 mg Oral Q6H  . insulin aspart  0-5 Units Subcutaneous QHS  . insulin aspart  0-9 Units Subcutaneous TID WC  . metoprolol tartrate  100 mg Oral BID  . sodium chloride  3 mL Intravenous Q12H   No results found. BMET    Component Value Date/Time   NA 136* 01/02/2014 0528   K 3.2* 01/02/2014 0528   CL 99 01/02/2014 0528   CO2 21 01/02/2014 0528   GLUCOSE 115* 01/02/2014 0528   BUN 77* 01/02/2014 0528   CREATININE 4.72* 01/02/2014 0528   CREATININE 0.76 05/23/2011 0956   CALCIUM 7.7* 01/02/2014 0528   GFRNONAA 9* 01/02/2014 0528   GFRAA 11* 01/02/2014 0528   CBC    Component Value Date/Time   WBC 8.1 12/27/2013 0420   RBC 2.65* 12/27/2013 0420   HGB 8.0* 12/27/2013 0420   HCT 24.1* 12/27/2013 0420   PLT 363 12/27/2013 0420   MCV 90.9 12/27/2013 0420   MCH 30.2 12/27/2013 0420   MCHC 33.2 12/27/2013 0420   RDW 13.5 12/27/2013 0420   LYMPHSABS 3.4 12/25/2013 0553   MONOABS 0.5 12/25/2013 0553   EOSABS 0.1 12/25/2013 0553   BASOSABS 0.0 12/25/2013 0553     Assessment: 1. Acute on ? CKD with uncertain baseline. SPEP and FLC's unremarkable. UOP better 1/7-1/9 on high dose IV Lasix. Pt had vein mapping 1/7. Cr remains with some variation between 4.4-4.9. Pt seen by [facility] 1/8, to go for tunneled cath, today. 2. Hyperphosphatemia / sec hyperparathytroidism - PTH high 1/5. Started binders this admission 3. HTN / DM - per primary team 4. Anemia - Hb last checked 1/3. Iron studies suggest anemia of chronic disease, likely DM+HTN+CKD. Started Aranesp 1/6.  Plan: 1. Lasix / antihypertensive meds on hold while NPO for cath placement today. Likely to start HD over the weekend after cath placed. 2. Trend Cr and electrolytes, monitor UOP. 3. Continue PO4 binders.  Likely start vitamin D with HD. 4. Continue Aranesp.  Please see also pending attending cosign for edits / additions.  Emmaline Kluver, MD PGY-2, [facility] 01/02/2014, 8:10 AM I have seen and examined this patient and agree with plan per Dr Venetia Maxon.  Pt was seen yest post procedure.  Will plan HD in AM.. Kynslei Art T,MD 01/03/2014 10:51 AM

## 2014-01-02 NOTE — Transfer of Care (Signed)
Immediate Anesthesia Transfer of Care Note  Patient: [name]  Procedure(s) Performed: Procedure(s): INSERTION OF DIALYSIS CATHETER (Right) ARTERIOVENOUS (AV) FISTULA CREATION (Left)  Patient Location: PACU  Anesthesia Type:General  Level of Consciousness: awake, sedated and patient cooperative  Airway & Oxygen Therapy: Patient Spontanous Breathing and Patient connected to face mask oxygen  Post-op Assessment: Report given to PACU RN, Post -op Vital signs reviewed and stable and Patient moving all extremities  Post vital signs: Reviewed and stable  Complications: No apparent anesthesia complications

## 2014-01-02 NOTE — Progress Notes (Signed)
[facility] PROGRESS NOTE  [name] [id number] DOB: 1956/01/10 DOA: 12/22/2013 PCP: Pcp Not In System  Assessment/Plan: 1. Acute respiratory failure with hypoxia/Community acquired pneumonia: resolved.  No respiratory issues today. Has completed 8 d of Levaquin. continues on mucinex, nebs, 02. Flu negative. 2. Tachycardia/SVT: No further episodes.Not currently on tele. 3. HYPERTENSION, ESSENTIAL,  Remains UNCONTROLLED : hopefully will improve once volume is controled on HD.  For now continue with lopressor, hydralazine. Lasix held today. 4. Severe concentric left ventricular hypertrophy/moderate pulmonary HTN/grade 1 diastolic dysfunction: Apparent new findings- no prior ECHO to compare - ?? HOCM -? of underlying amyloid. Foree term uncontrolled hypertension.  Cardiology has seen and recommended outpatient followup.  5. Chronic bilateral LEG EDEMA: Due to volume overload from renal dysfunction. Hopefully with improve with HD.  6. ?? DM type 2 -previous HgA1c was 9.2 in 2012, this admit 5.9. At present has diet controlled DM and no indication to begin medication  7. CKD stage 4 : Cath placed today for HD in near future. 8. HYPERLIPIDEMIA  9. PSYCHIATRIC DISORDER/DEAF/Depression: patient calm and no emotional distress. Have spoken with family at length and they are opposed to use of prozac. I will discontinue this medication. 10. Hearing loss: sign language interpreter 11. Dispo: significant amount of time spent today speaking with patient son and sister (via phone).  Discussed all aspects of her treatment and answered questions. Assured them that there is always someone here to answer questions, that sign language interpreter is always present for important converstations with the patient.    Code Status: full Family Communication: spoke with patients sister, son and saw patient without interpreter present today Disposition Plan: inpatient. Will dc to SNF when medically  stable.   Consultants: Cardiology  Nephrology  Psychiatry   Procedures:  none  Antibiotics: Zithromax 12/29 >>> 12/27/13  Rocephin 12/29 >>> 12/27/13  levaquin 12/27/13>>>>12/30/13   HPI/Subjective: Groggy from procedures today  Objective: Filed Vitals:   01/02/14 1448  BP: 153/67  Pulse: 65  Temp: 98 F (36.7 C)  Resp: 16    Intake/Output Summary (Last 24 hours) at 01/02/14 1658 Last data filed at 01/02/14 1300  Gross per 24 hour  Intake    490 ml  Output   1600 ml  Net  -1110 ml   Filed Weights   12/31/13 0529 01/01/14 0334 01/02/14 0452  Weight: 104.917 kg (231 lb 4.8 oz) 102.83 kg (226 lb 11.2 oz) 101.243 kg (223 lb 3.2 oz)    Exam:   General:  NAD  Cardiovascular: RRR  Respiratory: scattered rhonchi, good air movement  Abdomen: BS+, soft, non tender  Musculoskeletal: 1+ edema bilaterally  Data Reviewed: Basic Metabolic Panel:  Recent Labs Lab 12/29/13 0450 12/30/13 0309 12/31/13 0310 01/01/14 0401 01/02/14 0528  NA 138 134* 139 136* 136*  K 4.5 5.3 4.5 3.6* 3.2*  CL 104 100 102 97 99  CO2 19 17* 20 22 21   GLUCOSE 119* 115* 101* 119* 115*  BUN 72* 74* 75* 76* 77*  CREATININE 4.77* 4.65* 4.90* 4.67* 4.72*  CALCIUM 7.9* 7.5* 7.7* 8.2* 7.7*  PHOS 7.4* 7.2* 7.2* 7.6* 7.5*   Liver Function Tests:  Recent Labs Lab 12/29/13 0450 12/30/13 0309 12/31/13 0310 01/01/14 0401 01/02/14 0528  ALBUMIN 1.5* 1.3* 1.5* 1.7* 1.4*   No results found for this basename: LIPASE, AMYLASE,  in the last 168 hours No results found for this basename: AMMONIA,  in the last 168 hours CBC:  Recent Labs Lab 12/27/13 0420  WBC 8.1  HGB 8.0*  HCT 24.1*  MCV 90.9  PLT 363   Cardiac Enzymes: No results found for this basename: CKTOTAL, CKMB, CKMBINDEX, TROPONINI,  in the last 168 hours BNP (last 3 results) No results found for this basename: PROBNP,  in the last 8760 hours CBG:  Recent Labs Lab 01/01/14 2130 01/02/14 0557 01/02/14 0943  01/02/14 1323 01/02/14 1653  GLUCAP 163* 109* 102* 121* 126*    No results found for this or any previous visit (from the past 240 hour(s)).   Studies: Dg Chest Port 1 View  01/02/2014   CLINICAL DATA:  Status post dialysis catheter placement  EXAM: PORTABLE CHEST - 1 VIEW  COMPARISON:  12/26/2013  FINDINGS: Cardiac shadow is stable. The lungs are well aerated but demonstrates some right middle lobe infiltrate which was not seen on the prior exam. No pneumothorax is noted. No bony abnormality is seen. The dialysis catheter is in satisfactory position.  IMPRESSION: Status post dialysis catheter placement in satisfactory position.  New right middle lobe infiltrate.   Electronically Signed   By: Inez Catalina M.D.   On: 01/02/2014 14:27    Scheduled Meds: . aspirin  325 mg Oral Daily  . calcium acetate  667 mg Oral TID WC  . cefUROXime (ZINACEF) 1.5 GM IVPB  1.5 g Intravenous To OR  . cloNIDine  0.1 mg Oral TID  . darbepoetin (ARANESP) injection - NON-DIALYSIS  100 mcg Subcutaneous Q Tue-1800  . FLUoxetine  20 mg Oral Daily  . furosemide  160 mg Intravenous TID  . guaiFENesin  1,200 mg Oral BID  . heparin  5,000 Units Subcutaneous Q8H  . hydrALAZINE  100 mg Oral Q6H  . [START ON 01/03/2014] influenza vac split quadrivalent PF  0.5 mL Intramuscular Tomorrow-1000  . insulin aspart  0-5 Units Subcutaneous QHS  . insulin aspart  0-9 Units Subcutaneous TID WC  . metoprolol tartrate  100 mg Oral BID  . [START ON 01/03/2014] pneumococcal 23 valent vaccine  0.5 mL Intramuscular Tomorrow-1000  . sodium chloride  3 mL Intravenous Q12H   Continuous Infusions:   Principal Problem:   Acute respiratory failure with hypoxia Active Problems:   HYPERLIPIDEMIA   PSYCHIATRIC DISORDER   DEAF MUTISM   HYPERTENSION, ESSENTIAL, UNCONTROLLED   LEG EDEMA   Community acquired pneumonia   DM type 2 causing CKD stage 4   Acute renal failure   SVT (supraventricular tachycardia)   NSVT (nonsustained  ventricular tachycardia)   Abnormal echocardiogram   Chronic diastolic CHF (congestive heart failure)   Anemia   Constipation    Time spent: 30 minutes   [facility] Pager [phone] If 7PM-7AM, please contact night-coverage at [email or web address], password [email or web address] 01/02/2014, 4:58 PM  LOS: 11 days

## 2014-01-02 NOTE — Anesthesia Postprocedure Evaluation (Signed)
  Anesthesia Post-op Note  Patient: [name]  Procedure(s) Performed: Procedure(s): INSERTION OF DIALYSIS CATHETER (Right) ARTERIOVENOUS (AV) FISTULA CREATION (Left)  Patient Location: PACU  Anesthesia Type:General  Level of Consciousness: awake, alert  and oriented  Airway and Oxygen Therapy: Patient Spontanous Breathing and Patient connected to nasal cannula oxygen  Post-op Pain: none  Post-op Assessment: Post-op Vital signs reviewed, Patient's Cardiovascular Status Stable, Respiratory Function Stable, Patent Airway and No signs of Nausea or vomiting  Post-op Vital Signs: Reviewed and stable  Complications: No apparent anesthesia complications

## 2014-01-02 NOTE — H&P (View-Only) (Signed)
Referred by:  Dr. Mercy Moore  Reason for referral: New access  History of Present Illness  [name] is a 58 y.o. (July 23, 1956) female who presents for evaluation for permanent access.  This patient is nearly blind and deaf.  No sign interpreter is present at bedside.  History is reconstructed from chart.  The patient is being managed by Nephrology for acute on chronic renal failure.  From chart review, she has had significant fluid overload issues and psychiatry issues raised while admitted.  At this point, Nephrology and Internal Medicine agree that she needs to be started on hemodialysis.  No evidence of prior PPM placement of central venous catherization is documented.  Past Medical History  Diagnosis Date  . Hyperlipidemia   . Hypertension     a. Has previously refused blood pressure meds.   . Deaf     Divorced from husband but lives with him. Has daughter but she does not care for her.  . Bilateral leg edema     a. Chronic.  Marland Kitchen Psychiatric disorder     She frequently exhibits paranoia and has been diagnosed with psychotic d/o NOS during hospital stay in the past. She is tangetial and perseverative during her visits. She apparently has had a bad experience with mental health in [place] in the past and refuses to discuss mental issues for fear that she will be sent back there. Her paranoia, communication issues, financial woes and lack of fam  . Fibroid uterus   . Anemia     Due to fibroids.  . Heart murmur   . Diabetes mellitus     a. Per PCP note 2012 (A1C 9.2) - pt unwilling to take meds and was educated on risk of uncontrolled DM.  b. A1C 5.9 in 11/2013.    Past Surgical History  Procedure Laterality Date  . No past surgeries      History   Social History  . Marital Status: Married    Spouse Name: N/A    Number of Children: N/A  . Years of Education: N/A   Occupational History  . Not on file.   Social History Main Topics  . Smoking status: Never Smoker   .  Smokeless tobacco: Not on file     Comment: Prior dip  . Alcohol Use: No  . Drug Use: No  . Sexual Activity: Not on file   Other Topics Concern  . Not on file   Social History Narrative  . No narrative on file    Family History  Problem Relation Age of Onset  . Other Father     Drowned from fishing?  . Heart disease      Current Facility-Administered Medications  Medication Dose Route Frequency Provider Last Rate Last Dose  . acetaminophen (TYLENOL) tablet 650 mg  650 mg Oral Q6H PRN Bynum Bellows, MD   650 mg at 12/26/13 [id number]   Or  . acetaminophen (TYLENOL) suppository 650 mg  650 mg Rectal Q6H PRN Bynum Bellows, MD      . aspirin tablet 325 mg  325 mg Oral Daily Bynum Bellows, MD   325 mg at 01/01/14 1027  . bisacodyl (DULCOLAX) suppository 10 mg  10 mg Rectal Daily PRN Donne Hazel, MD   10 mg at 12/24/13 2149  . calcium acetate (PHOSLO) capsule 667 mg  667 mg Oral TID WC Windy Kalata, MD   667 mg at 01/01/14 1028  . cloNIDine (CATAPRES) tablet 0.1  mg  0.1 mg Oral TID [place], MD   0.1 mg at 01/01/14 1100  . darbepoetin (ARANESP) injection 100 mcg  100 mcg Subcutaneous Q Tue-1800 Windy Kalata, MD   100 mcg at 12/30/13 1737  . FLUoxetine (PROZAC) capsule 20 mg  20 mg Oral Daily Durward Parcel, MD   20 mg at 01/01/14 1027  . furosemide (LASIX) 160 mg in dextrose 5 % 50 mL IVPB  160 mg Intravenous TID Windy Kalata, MD   160 mg at 01/01/14 1030  . guaiFENesin (MUCINEX) 12 hr tablet 1,200 mg  1,200 mg Oral BID Eugenie Filler, MD   1,200 mg at 01/01/14 1027  . heparin injection 5,000 Units  5,000 Units Subcutaneous Q8H Bynum Bellows, MD   5,000 Units at 01/01/14 1432  . hydrALAZINE (APRESOLINE) injection 10 mg  10 mg Intravenous Q6H PRN Bynum Bellows, MD   10 mg at 12/29/13 1733  . hydrALAZINE (APRESOLINE) tablet 100 mg  100 mg Oral Q6H Eugenie Filler, MD   100 mg at 01/01/14 1432  . insulin aspart (novoLOG) injection 0-5  Units  0-5 Units Subcutaneous QHS Bynum Bellows, MD      . insulin aspart (novoLOG) injection 0-9 Units  0-9 Units Subcutaneous TID WC Bynum Bellows, MD   1 Units at 12/29/13 1733  . ipratropium (ATROVENT) nebulizer solution 0.5 mg  0.5 mg Nebulization Q2H PRN Eugenie Filler, MD      . levalbuterol [facility]) nebulizer solution 0.63 mg  0.63 mg Nebulization Q2H PRN Eugenie Filler, MD      . metoprolol (LOPRESSOR) tablet 100 mg  100 mg Oral BID Eugenie Filler, MD   100 mg at 01/01/14 1029  . naphazoline-pheniramine (NAPHCON-A) 0.025-0.3 % ophthalmic solution 1 drop  1 drop Both Eyes QID PRN Donne Hazel, MD   1 drop at 12/25/13 1518  . ondansetron (ZOFRAN) tablet 4 mg  4 mg Oral Q6H PRN Bynum Bellows, MD       Or  . ondansetron (ZOFRAN) injection 4 mg  4 mg Intravenous Q6H PRN Bynum Bellows, MD      . sodium chloride 0.9 % injection 3 mL  3 mL Intravenous Q12H Bynum Bellows, MD   3 mL at 01/01/14 1000    No Known Allergies  REVIEW OF SYSTEMS:  (Positives checked otherwise negative)  Cannot be obtained due to communication barriers (deaf and blind)   Physical Examination  Filed Vitals:   12/31/13 1330 12/31/13 1956 01/01/14 0334 01/01/14 1345  BP: 160/69 168/85 182/85 185/84  Pulse: 75 68 64 68  Temp: 97.4 F (36.3 C) 97.5 F (36.4 C) 97.4 F (36.3 C) 97.6 F (36.4 C)  TempSrc: Oral Oral Oral Oral  Resp: 18 18 20 18   Height:      Weight:   226 lb 11.2 oz (102.83 kg)   SpO2: 99% 98% 99% 98%   Body mass index is 38.89 kg/(m^2).  General: Alert, non-communicative, WD, morbidly obese  Head: [place]/AT  Ear/Nose/Throat: Hearing grossly not intact, nares w/o erythema or drainage, oropharynx not examined as pt cannot not cooperate with instructions  Eyes: Pupils somewhat reactive, cannot get EOMI though she tracks somewhat  Neck: Supple, no nuchal rigidity, no palpable LAD  Pulmonary: Sym exp, good air movt, CTAB, no wheezing, + rales and rhonchi,   Cardiac: RRR, Nl  S1, S2, faint Murmurs, no rubs or gallops  Vascular: Vessel Right Left  Radial Palpable Palpable (stronger than right)  Ulnar Faintly Palpable Faintly Palpable  Brachial Palpable Palpable  Carotid Palpable, without bruit Palpable, without bruit  Aorta Not palpable due obesity N/A  Femoral Palpable Palpable  Popliteal Not palpable Not palpable  PT Not Palpable Not Palpable  DP Not Palpable Not Palpable   Gastrointestinal: soft, NTND, -G/R, - HSM, - masses, - CVAT B, large pannus  Musculoskeletal: cannot not get patient to cooperate with test due to lack of communication , BLE have evidence of chronic venous stasis dermatitis, 1+ edema  Neurologic: cannot not get patient to cooperate with test due to lack of communication  Psychiatric: cannot communicate with patient to test  Dermatologic: See M/S exam for extremity exam, no rashes otherwise noted  Lymph : No Cervical, Axillary, or Inguinal lymphadenopathy   Non-Invasive Vascular Imaging  Vein Mapping  (Date: 01/01/2014):   R arm: acceptable vein conduits include none  L arm: acceptable vein conduits include none  Laboratory: CBC:    Component Value Date/Time   WBC 8.1 12/27/2013 0420   RBC 2.65* 12/27/2013 0420   HGB 8.0* 12/27/2013 0420   HCT 24.1* 12/27/2013 0420   PLT 363 12/27/2013 0420   MCV 90.9 12/27/2013 0420   MCH 30.2 12/27/2013 0420   MCHC 33.2 12/27/2013 0420   RDW 13.5 12/27/2013 0420   LYMPHSABS 3.4 12/25/2013 0553   MONOABS 0.5 12/25/2013 0553   EOSABS 0.1 12/25/2013 0553   BASOSABS 0.0 12/25/2013 0553    BMP:    Component Value Date/Time   NA 136* 01/01/2014 0401   K 3.6* 01/01/2014 0401   CL 97 01/01/2014 0401   CO2 22 01/01/2014 0401   GLUCOSE 119* 01/01/2014 0401   BUN 76* 01/01/2014 0401   CREATININE 4.67* 01/01/2014 0401   CREATININE 0.76 05/23/2011 0956   CALCIUM 8.2* 01/01/2014 0401   GFRNONAA 10* 01/01/2014 0401   GFRAA 11* 01/01/2014 0401    Coagulation: Lab Results  Component Value Date   INR 0.89 01/11/2011   No  results found for this basename: PTT   Radiology: CXR (12/26/13):  Decreased conspicuity of the perihilar opacities likely reflecting pulmonary edema.   No new focal regions of consolidation or new focal infiltrates. Note   a component of the decreased conspicuity on the lung findings likely secondary to increased lung volumes when compared to previous study.    Echo (12/23/13) Study Conclusions  - Left ventricle: Small LV cavity. There was severe concentric hypertrophy (septum and posterior walls measure 1.8-1.9 cm respectively). There is a prominent subendocardial "stripe" - this could suggest infiltrate process such as amyloidosis (consider cardiac MRI if clinically indicated). LV apical false tendon. Systolic function was vigorous. The estimated ejection fraction was in the range of 65% to 70%. Doppler parameters are consistent with abnormal left ventricular relaxation (grade 1 diastolic dysfunction). The E/e' ratio is >20, suggesting markedly elevated LV filling pressure. - Aortic valve: Sclerosis without stenosis. Noregurgitation. - Aorta: The aortic root was mildly calcified. - Mitral valve: Moderate posterior MAC. Mild regurgitation. - Left atrium: The atrium was mildly dilated (area 21 cm2). - Atrial septum: There was increased thickness of the septum, consistent with lipomatous hypertrophy. - Tricuspid valve: Poorly visualized. Mild regurgitation. - Pulmonary arteries: PA peak pressure: 10mm Hg (S). - Systemic veins: IVC is poorly visualized. - Pericardium, extracardiac: There was no pericardialeffusion.  Medical Decision Making  NAVAH PELTS is a 58 y.o. female who presents with acute on chronic renal failure, HTN with diasystolic  dysfunction, deafness, blindness, mood disorders, and h/o psychotic disorders   Realistically, no way to communicate effectively with this patient.  No interpreter present at bed side.  Will schedule patient for [facility] tomorrow, but not certain how  consent can be obtained.    Based on vein mapping and examination, this patient's permanent access options include: likely BUE AVG.  I would proceed with L AVG given better distal flow in L hand.  I can't communicate with the patient, so I don't know her hand dominance.  I would also remap her arms to see the size of her brachial veins to see if a brachial transposition is a possibility given the otherwise lack of fistula options.  I will order this study again.  Adele Barthel, MD [facility] Office: [phone] Pager: [phone]  01/01/2014, 2:55 PM

## 2014-01-02 NOTE — Progress Notes (Signed)
PT Cancellation Note  Patient Details Name: [name] MRN: [id number] DOB: [birth year]   Cancelled Treatment:    Reason Eval/Treat Not Completed: Patient at procedure or test/unavailable.  In OR for placement of HD catheter. 01/02/2014  [name], [place] [phone]  (pager)   Ange Puskas, Tessie Fass 01/02/2014, 3:17 PM

## 2014-01-02 NOTE — Telephone Encounter (Addendum)
Message copied by Gena Fray on Fri Jan 02, 2014  3:45 PM ------      Message from: Alfonso Patten      Created: Fri Jan 02, 2014  2:24 PM       FOLLOW UP 1 MONTH      ----- Message -----         From: Rosetta Posner, MD         Sent: 01/02/2014   1:12 PM           To: Patrici Ranks, Alfonso Patten, RN            The patient had a hemodialysis catheter placed with ultrasound visualization. Also had placement of left upper arm AV fistula. Biochemist, clinical. I did see her in the office for followup in one month.       ------  01/02/14: lm for pt re appt, dpm

## 2014-01-02 NOTE — Progress Notes (Signed)
Lerry Paterson sign language interpreter at bedside with pt

## 2014-01-02 NOTE — Interval H&P Note (Signed)
History and Physical Interval Note:  01/02/2014 10:40 AM  [name]  has presented today for surgery, with the diagnosis of ESRD  The various methods of treatment have been discussed with the patient and family. After consideration of risks, benefits and other options for treatment, the patient has consented to  Procedure(s): INSERTION OF DIALYSIS CATHETER (N/A) as a surgical intervention .  The patient's history has been reviewed, patient examined, no change in status, stable for surgery.  I have reviewed the patient's chart and labs.  Questions were answered to the patient's satisfaction.     Jacquan Savas

## 2014-01-02 NOTE — Op Note (Signed)
    OPERATIVE REPORT  DATE OF SURGERY: 01/02/2014  PATIENT: [name], 58 y.o. female MRN: [id number]  DOB: 1956-10-06  PRE-OPERATIVE DIAGNOSIS: End-stage renal disease  POST-OPERATIVE DIAGNOSIS:  Same  PROCEDURE: #1 right IJ hemodialysis catheter with ultrasound visualization, #2 left upper arm brachiocephalic AV fistula creation  SURGEON:  Curt Jews, M.D.  PHYSICIAN ASSISTANT: Collins  ANESTHESIA:  Gen.  EBL: Minimal ml  Total I/O In: 0  Out: 600 [Urine:600]  BLOOD ADMINISTERED: None  DRAINS: None  SPECIMEN: None  COUNTS CORRECT:  YES  PLAN OF CARE: PACU   PATIENT DISPOSITION:  PACU - hemodynamically stable  PROCEDURE DETAILS: The patient was scheduled for hemodialysis catheter placement. She is deaf and at the time of initial consultation there was no interpreter present. I spoke with the patient and her son who is a medical power of attorney with a interpreter for the deaf. I did recommend placement of [place]-term access same setting. He understood and wished to proceed. Vein map showed borderline vein for fistula and I did explain we would reimage this in the OR  Patient was placed in Trendelenburg position. The right and left neck were prepped and draped in sterile fashion. SonoSite visualization revealed good patency of right internal jugular vein. Using a Seldinger technique and a finder needle the 18-gauge needle was then used to access the jugular vein and a guidewire was passed to the level right atrium. A dilator peel-away sheath was passed over the guidewire and the dilator and guidewire were removed. 27 cm hemodialysis cath was passed the peel-away sheath and positioned level of the distal right atrium. The catheter was brought through subcutaneous tunnel to separate stab incision. The 2 lm ports were attached in both lumens flushed and aspirated easily and were locked with 1000 unit per cc heparin. Sterile dressing was applied.  Next is return to the left  arm was prepped and draped in sterile fashion. SonoSite ultrasound revealed good size of the antecubital vein into the cephalic vein. Scissors made to proceed with fistula. The left of the usual sterile fashion. Incision was made near the antecubital space to isolate the cephalic vein which was of good caliber and the brachial artery which was of excellent size. There was moderate atherosclerotic change of the brachial artery. The vein was ligated distally and was mobilized and then divided and brought into approximation with the brachial artery. The brachial artery was occluded proximal and distally was opened 11 blade symptoms A+. The vein was spatulated and sewn end-to-side to the artery with a running 6-0 Prolene suture. Clamps removed and excellent thrill was noted. The wound irrigated with saline. Hemostasis obtained with cautery. Wounds were closed with 3-0 Vicryl in the subcutaneous and subcuticular tissue. Benzoin Steri-Strips were applied.   Curt Jews, M.D. 01/02/2014 1:09 PM

## 2014-01-02 NOTE — OR Nursing (Signed)
Patient is deaf and has an interpreter, [name], and son, [name] present to assist with perioperative interview.  Dr. Donnetta Hutching discussed with son and patient through Ms. Hill to also perform a Fistula or Arteriovenous Gortex Graft in left arm.  Patient agrees and procedure was added to operative permit by Dr. Darene Lamer. Early.  [name] initialed as POA for his mother.  First procedure ended at 1206.

## 2014-01-03 DIAGNOSIS — D649 Anemia, unspecified: Secondary | ICD-10-CM

## 2014-01-03 LAB — RENAL FUNCTION PANEL
Albumin: 1.4 g/dL — ABNORMAL LOW (ref 3.5–5.2)
BUN: 76 mg/dL — ABNORMAL HIGH (ref 6–23)
CO2: 22 mEq/L (ref 19–32)
Calcium: 7.7 mg/dL — ABNORMAL LOW (ref 8.4–10.5)
Chloride: 99 mEq/L (ref 96–112)
Creatinine, Ser: 4.99 mg/dL — ABNORMAL HIGH (ref 0.50–1.10)
GFR calc Af Amer: 10 mL/min — ABNORMAL LOW (ref >90)
GFR calc non Af Amer: 9 mL/min — ABNORMAL LOW (ref >90)
Glucose, Bld: 118 mg/dL — ABNORMAL HIGH (ref 70–99)
Phosphorus: 7.3 mg/dL — ABNORMAL HIGH (ref 2.3–4.6)
Potassium: 3.9 mEq/L (ref 3.7–5.3)
Sodium: 136 mEq/L — ABNORMAL LOW (ref 137–147)

## 2014-01-03 LAB — GLUCOSE, CAPILLARY
Glucose-Capillary: 111 mg/dL — ABNORMAL HIGH (ref 70–99)
Glucose-Capillary: 151 mg/dL — ABNORMAL HIGH (ref 70–99)
Glucose-Capillary: 177 mg/dL — ABNORMAL HIGH (ref 70–99)

## 2014-01-03 MED ORDER — HYDRALAZINE HCL 50 MG PO TABS
100.0000 mg | ORAL_TABLET | Freq: Three times a day (TID) | ORAL | Status: DC
  Administered 2014-01-03 – 2014-01-08 (×14): 100 mg via ORAL
  Filled 2014-01-03 (×18): qty 2

## 2014-01-03 MED ORDER — RENA-VITE PO TABS
1.0000 | ORAL_TABLET | Freq: Every day | ORAL | Status: DC
  Administered 2014-01-03 – 2014-01-07 (×5): 1 via ORAL
  Filled 2014-01-03 (×6): qty 1

## 2014-01-03 NOTE — Procedures (Signed)
Pt seen on HD.  Ap 60 Vp 70.  On 4K bath.  SBP 166.  Tolerating HD well.

## 2014-01-03 NOTE — Progress Notes (Signed)
[facility] PROGRESS NOTE  KANNA KARWOSKI [id number] DOB: November 01, 1956 DOA: 12/22/2013 PCP: Pcp Not In System  Assessment/Plan: 1. CKD stage 4 : Cath placed 1/9 for HD today. Also had left upper arm AV fistula placed.  Weight is down another KG today (7KG over past 7 days). She continues to diurese well with high dose lasix 160 TID, still very edematous.  Cr continues to increase. Nephrology following, plan is for HD today. 2. HYPERTENSION, ESSENTIAL,  Remains UNCONTROLLED : hopefully will improve once volume is controled on HD.  For now continue with lopressor, hydralazine, lasix 3. Chronic bilateral LEG EDEMA: Due to volume overload from renal dysfunction. Hopefully with improve with HD.  4. ?? DM type 2 -previous HgA1c was 9.2 in 2012, this admit 5.9. At present has diet controlled DM and no indication to begin medication  5. PSYCHIATRIC DISORDER/DEAF/Depression: patient calm and no emotional distress. Have spoken with family at length and they are opposed to use of prozac. I have discontinued this medication.  She has been seen by psychiatry during admission and has competency.  Will continue to monitor and discuss with family.  She understands that the medication was discontinued and states that her mood is stable. 6. Acute respiratory failure with hypoxia/Community acquired pneumonia: resolved.  No respiratory issues today. Has completed 8 d of Levaquin. continues on mucinex, nebs, 02. Flu negative. 7. Tachycardia/SVT: No further episodes.Not currently on tele. 8. Severe concentric left ventricular hypertrophy/moderate pulmonary HTN/grade 1 diastolic dysfunction: Apparent new findings- no prior ECHO to compare - ?? HOCM -? of underlying amyloid. [place] term uncontrolled hypertension.  Cardiology has seen and recommended outpatient followup.  9. HYPERLIPIDEMIA : start statin today 10. Hearing loss: sign language interpreter present for communication 11. Dispo: preparing for HD today.  Will  dc to [facility] when stable   Code Status: full Family Communication:  Disposition Plan: inpatient. Will dc to SNF when medically stable.   Consultants: Cardiology  Nephrology  Psychiatry   Procedures:  none  Antibiotics: Zithromax 12/29 >>> 12/27/13  Rocephin 12/29 >>> 12/27/13  levaquin 12/27/13>>>>12/30/13   HPI/Subjective: Reports pain in the right neck at incision site.  Otherwise generally weak, no new acute concerns.  Objective: Filed Vitals:   01/03/14 0620  BP: 142/57  Pulse:   Temp:   Resp:     Intake/Output Summary (Last 24 hours) at 01/03/14 0807 Last data filed at 01/03/14 0636  Gross per 24 hour  Intake    490 ml  Output   1350 ml  Net   -860 ml   Filed Weights   01/01/14 0334 01/02/14 0452 01/03/14 0500  Weight: 102.83 kg (226 lb 11.2 oz) 101.243 kg (223 lb 3.2 oz) 100.1 kg (220 lb 10.9 oz)    Exam:   General:  NAD  Cardiovascular: RRR  Respiratory: scattered rhonchi, good air movement  Abdomen: BS+, soft, non tender  Musculoskeletal: 1+ edema bilaterally  Data Reviewed: Basic Metabolic Panel:  Recent Labs Lab 12/30/13 0309 12/31/13 0310 01/01/14 0401 01/02/14 0528 01/03/14 0312  NA 134* 139 136* 136* 136*  K 5.3 4.5 3.6* 3.2* 3.9  CL 100 102 97 99 99  CO2 17* 20 22 21 22   GLUCOSE 115* 101* 119* 115* 118*  BUN 74* 75* 76* 77* 76*  CREATININE 4.65* 4.90* 4.67* 4.72* 4.99*  CALCIUM 7.5* 7.7* 8.2* 7.7* 7.7*  PHOS 7.2* 7.2* 7.6* 7.5* 7.3*   Liver Function Tests:  Recent Labs Lab 12/30/13 0309 12/31/13 0310 01/01/14 0401 01/02/14  [id number] 01/03/14 0312  ALBUMIN 1.3* 1.5* 1.7* 1.4* 1.4*   No results found for this basename: LIPASE, AMYLASE,  in the last 168 hours No results found for this basename: AMMONIA,  in the last 168 hours CBC: No results found for this basename: WBC, NEUTROABS, HGB, HCT, MCV, PLT,  in the last 168 hours Cardiac Enzymes: No results found for this basename: CKTOTAL, CKMB, CKMBINDEX, TROPONINI,  in the  last 168 hours BNP (last 3 results) No results found for this basename: PROBNP,  in the last 8760 hours CBG:  Recent Labs Lab 01/02/14 0943 01/02/14 1323 01/02/14 1653 01/02/14 2057 01/03/14 0619  GLUCAP 102* 121* 126* 149* 111*    No results found for this or any previous visit (from the past 240 hour(s)).   Studies: Dg Chest Port 1 View  01/02/2014   CLINICAL DATA:  Status post dialysis catheter placement  EXAM: PORTABLE CHEST - 1 VIEW  COMPARISON:  12/26/2013  FINDINGS: Cardiac shadow is stable. The lungs are well aerated but demonstrates some right middle lobe infiltrate which was not seen on the prior exam. No pneumothorax is noted. No bony abnormality is seen. The dialysis catheter is in satisfactory position.  IMPRESSION: Status post dialysis catheter placement in satisfactory position.  New right middle lobe infiltrate.   Electronically Signed   By: Inez Catalina M.D.   On: 01/02/2014 14:27   Dg Fluoro Guide Cv Line-no Report  01/02/2014   CLINICAL DATA: Diatek Placement   FLOURO GUIDE CV LINE  Fluoroscopy was utilized by the requesting physician.  No radiographic  interpretation.     Scheduled Meds: . aspirin  325 mg Oral Daily  . calcium acetate  667 mg Oral TID WC  . cefUROXime (ZINACEF) 1.5 GM IVPB  1.5 g Intravenous To OR  . cloNIDine  0.1 mg Oral TID  . darbepoetin (ARANESP) injection - NON-DIALYSIS  100 mcg Subcutaneous Q Tue-1800  . furosemide  160 mg Intravenous TID  . guaiFENesin  1,200 mg Oral BID  . heparin  5,000 Units Subcutaneous Q8H  . hydrALAZINE  100 mg Oral Q6H  . influenza vac split quadrivalent PF  0.5 mL Intramuscular Tomorrow-1000  . insulin aspart  0-5 Units Subcutaneous QHS  . insulin aspart  0-9 Units Subcutaneous TID WC  . metoprolol tartrate  100 mg Oral BID  . pneumococcal 23 valent vaccine  0.5 mL Intramuscular Tomorrow-1000  . sodium chloride  3 mL Intravenous Q12H   Continuous Infusions:   Principal Problem:   Acute respiratory failure  with hypoxia Active Problems:   HYPERLIPIDEMIA   PSYCHIATRIC DISORDER   DEAF MUTISM   HYPERTENSION, ESSENTIAL, UNCONTROLLED   LEG EDEMA   Community acquired pneumonia   DM type 2 causing CKD stage 4   Acute renal failure   SVT (supraventricular tachycardia)   NSVT (nonsustained ventricular tachycardia)   Abnormal echocardiogram   Chronic diastolic CHF (congestive heart failure)   Anemia   Constipation    Time spent: 35 minutes   [facility] Pager [phone] If 7PM-7AM, please contact night-coverage at [email or web address], password [facility] 01/03/2014, 8:07 AM  LOS: 12 days

## 2014-01-03 NOTE — Progress Notes (Signed)
    Subjective  - POD #1   Talked to patient via interpreter No hand pain on left C/o discomfort at [facility] site and left upper arm   Physical Exam:  Good left arm grip strength Dressing dry       Assessment/Plan:  POD #1  Patient doing well following AVF and Diatek Will follow up in office in 6 weeks  BRABHAM IV, V. WELLS 01/03/2014 9:26 AM --  Filed Vitals:   01/03/14 0620  BP: 142/57  Pulse:   Temp:   Resp:     Intake/Output Summary (Last 24 hours) at 01/03/14 0926 Last data filed at 01/03/14 0636  Gross per 24 hour  Intake    490 ml  Output    750 ml  Net   -260 ml     Laboratory CBC    Component Value Date/Time   WBC 8.1 12/27/2013 0420   HGB 8.0* 12/27/2013 0420   HCT 24.1* 12/27/2013 0420   PLT 363 12/27/2013 0420    BMET    Component Value Date/Time   NA 136* 01/03/2014 0312   K 3.9 01/03/2014 0312   CL 99 01/03/2014 0312   CO2 22 01/03/2014 0312   GLUCOSE 118* 01/03/2014 0312   BUN 76* 01/03/2014 0312   CREATININE 4.99* 01/03/2014 0312   CREATININE 0.76 05/23/2011 0956   CALCIUM 7.7* 01/03/2014 0312   GFRNONAA 9* 01/03/2014 0312   GFRAA 10* 01/03/2014 0312    COAG Lab Results  Component Value Date   INR 0.89 01/11/2011   No results found for this basename: PTT    Antibiotics Anti-infectives   Start     Dose/Rate Route Frequency Ordered Stop   01/02/14 1115  cefUROXime (ZINACEF) 1.5 g in dextrose 5 % 50 mL IVPB     1.5 g 100 mL/hr over 30 Minutes Intravenous To Surgery 01/02/14 1108 01/03/14 1115   12/27/13 1700  levofloxacin (LEVAQUIN) tablet 750 mg  Status:  Discontinued     750 mg Oral Every 48 hours 12/27/13 1559 12/30/13 2002   12/23/13 2230  azithromycin (ZITHROMAX) tablet 500 mg  Status:  Discontinued     500 mg Oral Every 24 hours 12/23/13 0221 12/27/13 1559   12/23/13 2200  cefTRIAXone (ROCEPHIN) 1 g in dextrose 5 % 50 mL IVPB  Status:  Discontinued     1 g 100 mL/hr over 30 Minutes Intravenous Every 24 hours 12/23/13 0221  12/27/13 1559   12/22/13 2215  azithromycin (ZITHROMAX) 500 mg in dextrose 5 % 250 mL IVPB  Status:  Discontinued     500 mg 250 mL/hr over 60 Minutes Intravenous  Once 12/22/13 2208 12/22/13 2208   12/22/13 2200  azithromycin (ZITHROMAX) 500 mg in dextrose 5 % 250 mL IVPB     500 mg 250 mL/hr over 60 Minutes Intravenous  Once 12/22/13 2132 12/22/13 2347   12/22/13 2030  cefTRIAXone (ROCEPHIN) 1 g in dextrose 5 % 50 mL IVPB     1 g 100 mL/hr over 30 Minutes Intravenous  Once 12/22/13 2020 12/22/13 2204   12/22/13 2030  azithromycin (ZITHROMAX) 500 mg in dextrose 5 % 250 mL IVPB  Status:  Discontinued     500 mg 250 mL/hr over 60 Minutes Intravenous  Once 12/22/13 2020 12/22/13 2132       V. Leia Alf, M.D. [facility] Office: [phone] Pager:  [phone]

## 2014-01-03 NOTE — Progress Notes (Signed)
[facility]  S: Spoke with pt with sign interpreter. Pt without specific complaints. Feels her breathing is better and swelling is no worse. Pt does feel she's starting to make less urine, again. Surgery for cath and fistula formation yesterday went well.   O:BP 142/57  Pulse 63  Temp(Src) 98.5 F (36.9 C) (Oral)  Resp 17  Ht 5\' 4"  (1.626 m)  Wt 220 lb 10.9 oz (100.1 kg)  BMI 37.86 kg/m2  SpO2 100%  Intake/Output Summary (Last 24 hours) at 01/03/14 0807 Last data filed at 01/03/14 0636  Gross per 24 hour  Intake    490 ml  Output   1350 ml  Net   -860 ml  Output: about 1.3L 1/9 0700 to 1/10 0700.  Weight change: -2 lb 8.3 oz (-1.143 kg) Gen: initially sleeping, easily wakes, in no distress CVS: RRR, no definite murmur appreciated Resp: scattered coarse breath sounds (?transmitted upper airway) but normal WOB on [place] Abd:+ BS, soft NTND Ext: unchanged bilateral symmetric 2+ edema to just below knees  AVF LUA + bruit Vascular access: right tunneled catheter without bleeding / drainage  Left UE dressing in place, clean / dry / intact over fistula formation wound NEURO: Deaf, no obvious new deficit; very poor eyesight at baseline   . aspirin  325 mg Oral Daily  . calcium acetate  667 mg Oral TID WC  . cefUROXime (ZINACEF) 1.5 GM IVPB  1.5 g Intravenous To OR  . cloNIDine  0.1 mg Oral TID  . darbepoetin (ARANESP) injection - NON-DIALYSIS  100 mcg Subcutaneous Q Tue-1800  . furosemide  160 mg Intravenous TID  . guaiFENesin  1,200 mg Oral BID  . heparin  5,000 Units Subcutaneous Q8H  . hydrALAZINE  100 mg Oral Q6H  . influenza vac split quadrivalent PF  0.5 mL Intramuscular Tomorrow-1000  . insulin aspart  0-5 Units Subcutaneous QHS  . insulin aspart  0-9 Units Subcutaneous TID WC  . metoprolol tartrate  100 mg Oral BID  . pneumococcal 23 valent vaccine  0.5 mL Intramuscular Tomorrow-1000  . sodium chloride  3 mL Intravenous Q12H   Dg Chest Port 1  View  01/02/2014   CLINICAL DATA:  Status post dialysis catheter placement  EXAM: PORTABLE CHEST - 1 VIEW  COMPARISON:  12/26/2013  FINDINGS: Cardiac shadow is stable. The lungs are well aerated but demonstrates some right middle lobe infiltrate which was not seen on the prior exam. No pneumothorax is noted. No bony abnormality is seen. The dialysis catheter is in satisfactory position.  IMPRESSION: Status post dialysis catheter placement in satisfactory position.  New right middle lobe infiltrate.   Electronically Signed   By: Inez Catalina M.D.   On: 01/02/2014 14:27   Dg Fluoro Guide Cv Line-no Report  01/02/2014   CLINICAL DATA: Diatek Placement   FLOURO GUIDE CV LINE  Fluoroscopy was utilized by the requesting physician.  No radiographic  interpretation.    BMET    Component Value Date/Time   NA 136* 01/03/2014 0312   K 3.9 01/03/2014 0312   CL 99 01/03/2014 0312   CO2 22 01/03/2014 0312   GLUCOSE 118* 01/03/2014 0312   BUN 76* 01/03/2014 0312   CREATININE 4.99* 01/03/2014 0312   CREATININE 0.76 05/23/2011 0956   CALCIUM 7.7* 01/03/2014 0312   GFRNONAA 9* 01/03/2014 0312   GFRAA 10* 01/03/2014 0312   CBC    Component Value Date/Time   WBC 8.1 12/27/2013 0420   RBC 2.65*  12/27/2013 0420   HGB 8.0* 12/27/2013 0420   HCT 24.1* 12/27/2013 0420   PLT 363 12/27/2013 0420   MCV 90.9 12/27/2013 0420   MCH 30.2 12/27/2013 0420   MCHC 33.2 12/27/2013 0420   RDW 13.5 12/27/2013 0420   LYMPHSABS 3.4 12/25/2013 0553   MONOABS 0.5 12/25/2013 0553   EOSABS 0.1 12/25/2013 0553   BASOSABS 0.0 12/25/2013 0553     Assessment: 1. Acute on ? CKD with uncertain baseline. SPEP and FLC's unremarkable. UOP starting to trend down again despite high dose IV Lasix. Right tunneled cath placed 1/9 and pt had left brachiocephalic fistula formation procedure, as well. Cr back up to 4.9. 2. Hyperphosphatemia / sec hyperparathytroidism - PTH high 1/5. Started binders this admission 3. HTN / DM - per primary team 4. Anemia - Hb last checked  1/3. Iron studies suggest anemia of chronic disease, likely DM+HTN+CKD. Started Aranesp 1/6.  Plan: 1. Likely start HD today. Uncertain how much urine pt will continue to produce. 2. Trend Hb, electrolytes, monitor UOP. 3. Continue PO4 binders. Likely start vitamin D with HD. 4. Continue Aranesp.  Please see also pending attending cosign for edits / additions.  Emmaline Kluver, MD PGY-2, [facility] 01/03/2014, 8:07 AM I have seen and examined this patient and agree with plan per Dr Venetia Maxon.  UO decreasing and Scr higher.  Will plan HD today.  Bethena Roys will start clip process.  I tried to call daughter but got answering machine.  Will decrease hydralazine dose and Dc IV lasix. Lyfe Monger T,MD 01/03/2014 10:51 AM

## 2014-01-04 DIAGNOSIS — N186 End stage renal disease: Secondary | ICD-10-CM

## 2014-01-04 DIAGNOSIS — I10 Essential (primary) hypertension: Secondary | ICD-10-CM

## 2014-01-04 DIAGNOSIS — Z992 Dependence on renal dialysis: Secondary | ICD-10-CM

## 2014-01-04 LAB — GLUCOSE, CAPILLARY
Glucose-Capillary: 125 mg/dL — ABNORMAL HIGH (ref 70–99)
Glucose-Capillary: 144 mg/dL — ABNORMAL HIGH (ref 70–99)
Glucose-Capillary: 139 mg/dL — ABNORMAL HIGH (ref 70–99)
Glucose-Capillary: 155 mg/dL — ABNORMAL HIGH (ref 70–99)

## 2014-01-04 LAB — RENAL FUNCTION PANEL
Albumin: 1.7 g/dL — ABNORMAL LOW (ref 3.5–5.2)
BUN: 55 mg/dL — ABNORMAL HIGH (ref 6–23)
CO2: 26 mEq/L (ref 19–32)
Calcium: 7.7 mg/dL — ABNORMAL LOW (ref 8.4–10.5)
Chloride: 97 mEq/L (ref 96–112)
Creatinine, Ser: 4.23 mg/dL — ABNORMAL HIGH (ref 0.50–1.10)
GFR calc Af Amer: 12 mL/min — ABNORMAL LOW (ref >90)
GFR calc non Af Amer: 11 mL/min — ABNORMAL LOW (ref >90)
Glucose, Bld: 123 mg/dL — ABNORMAL HIGH (ref 70–99)
Phosphorus: 4.8 mg/dL — ABNORMAL HIGH (ref 2.3–4.6)
Potassium: 3.6 mEq/L — ABNORMAL LOW (ref 3.7–5.3)
Sodium: 136 mEq/L — ABNORMAL LOW (ref 137–147)

## 2014-01-04 LAB — HEPATITIS B SURFACE ANTIBODY,QUALITATIVE: Hep B S Ab: NEGATIVE

## 2014-01-04 LAB — HEPATITIS B CORE ANTIBODY, TOTAL: Hep B Core Total Ab: NONREACTIVE

## 2014-01-04 NOTE — Progress Notes (Signed)
[facility] PROGRESS NOTE  [name] [id number] DOB: [birth year] DOA: 12/22/2013 PCP: Pcp Not In System  Assessment/Plan: 1. CKD stage 4 : HD yesterday went well.  Weight down 1.5 KG. Lasix discontinued. 2. HYPERTENSION, ESSENTIAL,  Remains UNCONTROLLED : Elevated even after HD.  For now continue with lopressor, hydralazine, catapres.  May need to increase catapres. 3. Chronic bilateral LEG EDEMA: Due to volume overload from renal dysfunction. Improved with HD.  4. ?? DM type 2 -previous HgA1c was 9.2 in 2012, this admit 5.9. At present has diet controlled DM and no indication to begin medication  5. PSYCHIATRIC DISORDER/DEAF/Depression: patient calm and no emotional distress. Have spoken with family at length and they are opposed to use of prozac. I have discontinued this medication.  She has been seen by psychiatry during admission and has competency.  Will continue to monitor and discuss with family.  She understands that the medication was discontinued and states that her mood is stable.  There seems to be some confusion between family members reagarding the patients psychiatric history and medications.  6. Acute respiratory failure with hypoxia/Community acquired pneumonia: resolved.  No respiratory issues today. Has completed 8 d of Levaquin. continues on mucinex, nebs, 02. Flu negative. 7. Tachycardia/SVT: No further episodes.Not currently on tele. 8. Severe concentric left ventricular hypertrophy/moderate pulmonary HTN/grade 1 diastolic dysfunction: Apparent new findings- no prior ECHO to compare - ?? HOCM -? of underlying amyloid. [place] term uncontrolled hypertension.  Cardiology has seen and recommended outpatient followup.  9. HYPERLIPIDEMIA : start statin today 10. Hearing loss: sign language interpreter present for communication 11. Vision loss: this will need to be evaluated as an outpatient. Very important for this sign language dependant patient. 12. Dispo: DC to [place]  once stable continue HD from there  Code Status: full Family Communication: spoke with patient via interpreter. Nursing will call son [name] to update. Disposition Plan: inpatient. Will dc to SNF when medically stable.   Consultants: Cardiology  Nephrology  Psychiatry   Procedures:  none  Antibiotics: Zithromax 12/29 >>> 12/27/13  Rocephin 12/29 >>> 12/27/13  levaquin 12/27/13>>>>12/30/13   HPI/Subjective: Groggy and confused when first waking up today.  Feeling weak. Frustrated witn poor vision.  Objective: Filed Vitals:   01/04/14 0412  BP: 160/72  Pulse: 72  Temp: 98.4 F (36.9 C)  Resp: 17    Intake/Output Summary (Last 24 hours) at 01/04/14 0715 Last data filed at 01/04/14 0317  Gross per 24 hour  Intake    480 ml  Output   3425 ml  Net  -2945 ml   Filed Weights   01/03/14 0500 01/03/14 1650 01/04/14 0412  Weight: 100.1 kg (220 lb 10.9 oz) 98 kg (216 lb 0.8 oz) 98.6 kg (217 lb 6 oz)    Exam:   General:  NAD, groggy  Cardiovascular: RRR  Respiratory: scattered rhonchi, good air movement, some coughing  Abdomen: BS+, soft, non tender  Musculoskeletal: 1+ edema bilaterally (LE edema much decreased, still with UE edema and bibasilar crackles)  Data Reviewed: Basic Metabolic Panel:  Recent Labs Lab 12/31/13 0310 01/01/14 0401 01/02/14 0528 01/03/14 0312 01/04/14 0423  NA 139 136* 136* 136* 136*  K 4.5 3.6* 3.2* 3.9 3.6*  CL 102 97 99 99 97  CO2 20 22 21 22 26   GLUCOSE 101* 119* 115* 118* 123*  BUN 75* 76* 77* 76* 55*  CREATININE 4.90* 4.67* 4.72* 4.99* 4.23*  CALCIUM 7.7* 8.2* 7.7* 7.7* 7.7*  PHOS 7.2* 7.6* 7.5*  7.3* 4.8*   Liver Function Tests:  Recent Labs Lab 12/31/13 0310 01/01/14 0401 01/02/14 0528 01/03/14 0312 01/04/14 0423  ALBUMIN 1.5* 1.7* 1.4* 1.4* 1.7*   No results found for this basename: LIPASE, AMYLASE,  in the last 168 hours No results found for this basename: AMMONIA,  in the last 168 hours CBC: No results found  for this basename: WBC, NEUTROABS, HGB, HCT, MCV, PLT,  in the last 168 hours Cardiac Enzymes: No results found for this basename: CKTOTAL, CKMB, CKMBINDEX, TROPONINI,  in the last 168 hours BNP (last 3 results) No results found for this basename: PROBNP,  in the last 8760 hours CBG:  Recent Labs Lab 01/02/14 2057 01/03/14 0619 01/03/14 1123 01/03/14 2102 01/04/14 0602  GLUCAP 149* 111* 151* 177* 125*    No results found for this or any previous visit (from the past 240 hour(s)).   Studies: Dg Chest Port 1 View  01/02/2014   CLINICAL DATA:  Status post dialysis catheter placement  EXAM: PORTABLE CHEST - 1 VIEW  COMPARISON:  12/26/2013  FINDINGS: Cardiac shadow is stable. The lungs are well aerated but demonstrates some right middle lobe infiltrate which was not seen on the prior exam. No pneumothorax is noted. No bony abnormality is seen. The dialysis catheter is in satisfactory position.  IMPRESSION: Status post dialysis catheter placement in satisfactory position.  New right middle lobe infiltrate.   Electronically Signed   By: Inez Catalina M.D.   On: 01/02/2014 14:27   Dg Fluoro Guide Cv Line-no Report  01/02/2014   CLINICAL DATA: Diatek Placement   FLOURO GUIDE CV LINE  Fluoroscopy was utilized by the requesting physician.  No radiographic  interpretation.     Scheduled Meds: . aspirin  325 mg Oral Daily  . calcium acetate  667 mg Oral TID WC  . cloNIDine  0.1 mg Oral TID  . darbepoetin (ARANESP) injection - NON-DIALYSIS  100 mcg Subcutaneous Q Tue-1800  . guaiFENesin  1,200 mg Oral BID  . heparin  5,000 Units Subcutaneous Q8H  . hydrALAZINE  100 mg Oral Q8H  . insulin aspart  0-5 Units Subcutaneous QHS  . insulin aspart  0-9 Units Subcutaneous TID WC  . metoprolol tartrate  100 mg Oral BID  . multivitamin  1 tablet Oral QHS  . sodium chloride  3 mL Intravenous Q12H   Continuous Infusions:   Principal Problem:   Acute respiratory failure with hypoxia Active Problems:    HYPERLIPIDEMIA   PSYCHIATRIC DISORDER   DEAF MUTISM   HYPERTENSION, ESSENTIAL, UNCONTROLLED   LEG EDEMA   Community acquired pneumonia   DM type 2 causing CKD stage 4   Acute renal failure   SVT (supraventricular tachycardia)   NSVT (nonsustained ventricular tachycardia)   Abnormal echocardiogram   Chronic diastolic CHF (congestive heart failure)   Anemia   Constipation    Time spent: 35 minutes   [place] [facility] Pager [phone] If 7PM-7AM, please contact night-coverage at [email or web address], password [facility] 01/04/2014, 7:15 AM  LOS: 13 days

## 2014-01-04 NOTE — Progress Notes (Signed)
S: Feeling a little better  Appetite better O:BP 160/72  Pulse 72  Temp(Src) 98.4 F (36.9 C) (Oral)  Resp 17  Ht 5\' 4"  (1.626 m)  Wt 98.6 kg (217 lb 6 oz)  BMI 37.29 kg/m2  SpO2 100%  Intake/Output Summary (Last 24 hours) at 01/04/14 0930 Last data filed at 01/04/14 [id number]  Gross per 24 hour  Intake    240 ml  Output   3425 ml  Net  -3185 ml   Weight change: -2.1 kg (-4 lb 10.1 oz) [id number] an alert CVS:RRR Resp:bil crackles Abd:+ BS NTND Ext:tr-1+ edema   LUA AVF + bruit NEURO: Deaf, poor vision  No asterixis Rt perm cath   . aspirin  325 mg Oral Daily  . calcium acetate  667 mg Oral TID WC  . cloNIDine  0.1 mg Oral TID  . darbepoetin (ARANESP) injection - NON-DIALYSIS  100 mcg Subcutaneous Q Tue-1800  . guaiFENesin  1,200 mg Oral BID  . heparin  5,000 Units Subcutaneous Q8H  . hydrALAZINE  100 mg Oral Q8H  . insulin aspart  0-5 Units Subcutaneous QHS  . insulin aspart  0-9 Units Subcutaneous TID WC  . metoprolol tartrate  100 mg Oral BID  . multivitamin  1 tablet Oral QHS  . sodium chloride  3 mL Intravenous Q12H   Dg Chest Port 1 View  01/02/2014   CLINICAL DATA:  Status post dialysis catheter placement  EXAM: PORTABLE CHEST - 1 VIEW  COMPARISON:  12/26/2013  FINDINGS: Cardiac shadow is stable. The lungs are well aerated but demonstrates some right middle lobe infiltrate which was not seen on the prior exam. No pneumothorax is noted. No bony abnormality is seen. The dialysis catheter is in satisfactory position.  IMPRESSION: Status post dialysis catheter placement in satisfactory position.  New right middle lobe infiltrate.   Electronically Signed   By: Inez Catalina M.D.   On: 01/02/2014 14:27   Dg Fluoro Guide Cv Line-no Report  01/02/2014   CLINICAL DATA: Diatek Placement   FLOURO GUIDE CV LINE  Fluoroscopy was utilized by the requesting physician.  No radiographic  interpretation.    BMET    Component Value Date/Time   NA 136* 01/04/2014 0423   K 3.6* 01/04/2014  0423   CL 97 01/04/2014 0423   CO2 26 01/04/2014 0423   GLUCOSE 123* 01/04/2014 0423   BUN 55* 01/04/2014 0423   CREATININE 4.23* 01/04/2014 0423   CREATININE 0.76 05/23/2011 0956   CALCIUM 7.7* 01/04/2014 0423   GFRNONAA 11* 01/04/2014 0423   GFRAA 12* 01/04/2014 0423   CBC    Component Value Date/Time   WBC 8.1 12/27/2013 0420   RBC 2.65* 12/27/2013 0420   HGB 8.0* 12/27/2013 0420   HCT 24.1* 12/27/2013 0420   PLT 363 12/27/2013 0420   MCV 90.9 12/27/2013 0420   MCH 30.2 12/27/2013 0420   MCHC 33.2 12/27/2013 0420   RDW 13.5 12/27/2013 0420   LYMPHSABS 3.4 12/25/2013 0553   MONOABS 0.5 12/25/2013 0553   EOSABS 0.1 12/25/2013 0553   BASOSABS 0.0 12/25/2013 0553     Assessment: 1. New ESRD sec DM/HTN 2. HTN 3. Vol overload, improving 4. Anemia on aranesp 5. Sec HPTH  PTH 289, not on hectorol  Plan: 1.  HD in AM.  Will check labs with HD 2. Working on outpt spot 3. I will try to call son today 4.  Plan is for her to go to [facility]  T

## 2014-01-05 ENCOUNTER — Encounter (HOSPITAL_COMMUNITY): Payer: Self-pay | Admitting: Vascular Surgery

## 2014-01-05 DIAGNOSIS — K5900 Constipation, unspecified: Secondary | ICD-10-CM

## 2014-01-05 LAB — RENAL FUNCTION PANEL
Albumin: 1.7 g/dL — ABNORMAL LOW (ref 3.5–5.2)
BUN: 60 mg/dL — ABNORMAL HIGH (ref 6–23)
CO2: 25 mEq/L (ref 19–32)
Calcium: 7.8 mg/dL — ABNORMAL LOW (ref 8.4–10.5)
Chloride: 97 mEq/L (ref 96–112)
Creatinine, Ser: 4.62 mg/dL — ABNORMAL HIGH (ref 0.50–1.10)
GFR calc Af Amer: 11 mL/min — ABNORMAL LOW (ref >90)
GFR calc non Af Amer: 10 mL/min — ABNORMAL LOW (ref >90)
Glucose, Bld: 173 mg/dL — ABNORMAL HIGH (ref 70–99)
Phosphorus: 5 mg/dL — ABNORMAL HIGH (ref 2.3–4.6)
Potassium: 3.9 mEq/L (ref 3.7–5.3)
Sodium: 134 mEq/L — ABNORMAL LOW (ref 137–147)

## 2014-01-05 LAB — GLUCOSE, CAPILLARY
Glucose-Capillary: 121 mg/dL — ABNORMAL HIGH (ref 70–99)
Glucose-Capillary: 118 mg/dL — ABNORMAL HIGH (ref 70–99)
Glucose-Capillary: 110 mg/dL — ABNORMAL HIGH (ref 70–99)

## 2014-01-05 LAB — CBC
HCT: 29.3 % — ABNORMAL LOW (ref 36.0–46.0)
Hemoglobin: 9.7 g/dL — ABNORMAL LOW (ref 12.0–15.0)
MCH: 30 pg (ref 26.0–34.0)
MCHC: 33.1 g/dL (ref 30.0–36.0)
MCV: 90.7 fL (ref 78.0–100.0)
Platelets: 347 10*3/uL (ref 150–400)
RBC: 3.23 MIL/uL — ABNORMAL LOW (ref 3.87–5.11)
RDW: 14 % (ref 11.5–15.5)
WBC: 8 10*3/uL (ref 4.0–10.5)

## 2014-01-05 MED ORDER — HEPARIN SODIUM (PORCINE) 1000 UNIT/ML IJ SOLN
1900.0000 [IU] | Freq: Once | INTRAMUSCULAR | Status: AC
  Administered 2014-01-05: 1900 [IU] via INTRAVENOUS

## 2014-01-05 MED ORDER — POLYETHYLENE GLYCOL 3350 17 G PO PACK
17.0000 g | PACK | Freq: Every day | ORAL | Status: DC
  Administered 2014-01-05 – 2014-01-07 (×3): 17 g via ORAL
  Filled 2014-01-05 (×4): qty 1

## 2014-01-05 MED ORDER — SIMVASTATIN 20 MG PO TABS
20.0000 mg | ORAL_TABLET | Freq: Every day | ORAL | Status: DC
  Administered 2014-01-06 – 2014-01-07 (×2): 20 mg via ORAL
  Filled 2014-01-05 (×4): qty 1

## 2014-01-05 NOTE — Progress Notes (Signed)
[facility] PROGRESS NOTE  [name] [id number] DOB: 06/19/1956 DOA: 12/22/2013 PCP: Pcp Not In System  Assessment/Plan: 1. CKD stage 4 : HD today 2. HYPERTENSION, ESSENTIAL,  Remains UNCONTROLLED : Elevated even after HD.  For now continue with lopressor, hydralazine, catapres.   increase catapres. 3. Chronic bilateral LEG EDEMA:  Improved with HD.  4. ?? DM type 2 -previous HgA1c was 9.2 in 2012, this admit 5.9. At present has diet controlled DM and no indication to begin medication  5. PSYCHIATRIC DISORDER/DEAF/Depression: patient calm and no emotional distress. Have spoken with family at length and they are opposed to use of prozac. I have discontinued this medication.  She has been seen by psychiatry during admission and has competency.  Will continue to monitor and discuss with family.  She understands that the medication was discontinued and states that her mood is stable.  There seems to be some confusion between family members reagarding the patients psychiatric history and medications.  6. Acute respiratory failure with hypoxia/Community acquired pneumonia: resolved.  No respiratory issues today. Has completed 8 d of Levaquin. continues on mucinex, nebs, 02. Flu negative. 7. Tachycardia/SVT: No further episodes.Not currently on tele. 8. Severe concentric left ventricular hypertrophy/moderate pulmonary HTN/grade 1 diastolic dysfunction: Apparent new findings- no prior ECHO to compare - ?? HOCM -? of underlying amyloid. [place] term uncontrolled hypertension.  Cardiology has seen and recommended outpatient followup.  9. HYPERLIPIDEMIA : start statin today 10. Hearing loss: sign language interpreter present for communication 11. Vision loss: this will need to be evaluated as an outpatient. Very important for this sign language dependant patient. 12. Dispo: DC to [place] once stable continue HD from there. Needs outpatient HD slot.  Code Status: full Family Communication: spoke  with patient via interpreter. Nursing will call son [name] to update. Disposition Plan: inpatient. Will dc to SNF when medically stable.   Consultants: Cardiology  Nephrology  Psychiatry   Procedures:  none  Antibiotics: Zithromax 12/29 >>> 12/27/13  Rocephin 12/29 >>> 12/27/13  levaquin 12/27/13>>>>12/30/13   HPI/Subjective: Groggy and confused when first waking up today.  Feeling weak. Frustrated witn poor vision.  Objective: Filed Vitals:   01/05/14 0445  BP: 175/77  Pulse: 74  Temp: 98.6 F (37 C)  Resp: 19    Intake/Output Summary (Last 24 hours) at 01/05/14 1033 Last data filed at 01/05/14 0100  Gross per 24 hour  Intake    240 ml  Output    800 ml  Net   -560 ml   Filed Weights   01/03/14 1650 01/04/14 0412 01/05/14 0445  Weight: 98 kg (216 lb 0.8 oz) 98.6 kg (217 lb 6 oz) 98.2 kg (216 lb 7.9 oz)    Exam:   General:  NAD, groggy  Cardiovascular: RRR  Respiratory: scattered rhonchi, good air movement, some coughing  Abdomen: BS+, soft, non tender  Musculoskeletal: 1+ edema bilaterally (LE edema much decreased, still with UE edema and bibasilar crackles)  Data Reviewed: Basic Metabolic Panel:  Recent Labs Lab 12/31/13 0310 01/01/14 0401 01/02/14 0528 01/03/14 0312 01/04/14 0423  NA 139 136* 136* 136* 136*  K 4.5 3.6* 3.2* 3.9 3.6*  CL 102 97 99 99 97  CO2 20 22 21 22 26   GLUCOSE 101* 119* 115* 118* 123*  BUN 75* 76* 77* 76* 55*  CREATININE 4.90* 4.67* 4.72* 4.99* 4.23*  CALCIUM 7.7* 8.2* 7.7* 7.7* 7.7*  PHOS 7.2* 7.6* 7.5* 7.3* 4.8*   Liver Function Tests:  Recent Labs Lab  12/31/13 0310 01/01/14 0401 01/02/14 0528 01/03/14 0312 01/04/14 0423  ALBUMIN 1.5* 1.7* 1.4* 1.4* 1.7*   No results found for this basename: LIPASE, AMYLASE,  in the last 168 hours No results found for this basename: AMMONIA,  in the last 168 hours CBC: No results found for this basename: WBC, NEUTROABS, HGB, HCT, MCV, PLT,  in the last 168 hours Cardiac  Enzymes: No results found for this basename: CKTOTAL, CKMB, CKMBINDEX, TROPONINI,  in the last 168 hours BNP (last 3 results) No results found for this basename: PROBNP,  in the last 8760 hours CBG:  Recent Labs Lab 01/04/14 0602 01/04/14 1118 01/04/14 1630 01/04/14 2100 01/05/14 0625  GLUCAP 125* 144* 139* 155* 121*    No results found for this or any previous visit (from the past 240 hour(s)).   Studies: No results found.  Scheduled Meds: . aspirin  325 mg Oral Daily  . calcium acetate  667 mg Oral TID WC  . cloNIDine  0.1 mg Oral TID  . darbepoetin (ARANESP) injection - NON-DIALYSIS  100 mcg Subcutaneous Q Tue-1800  . guaiFENesin  1,200 mg Oral BID  . heparin  5,000 Units Subcutaneous Q8H  . hydrALAZINE  100 mg Oral Q8H  . insulin aspart  0-5 Units Subcutaneous QHS  . insulin aspart  0-9 Units Subcutaneous TID WC  . metoprolol tartrate  100 mg Oral BID  . multivitamin  1 tablet Oral QHS  . sodium chloride  3 mL Intravenous Q12H   Continuous Infusions:   Principal Problem:   Acute respiratory failure with hypoxia Active Problems:   HYPERLIPIDEMIA   PSYCHIATRIC DISORDER   DEAF MUTISM   HYPERTENSION, ESSENTIAL, UNCONTROLLED   LEG EDEMA   Community acquired pneumonia   DM type 2 causing CKD stage 4   Acute renal failure   SVT (supraventricular tachycardia)   NSVT (nonsustained ventricular tachycardia)   Abnormal echocardiogram   Chronic diastolic CHF (congestive heart failure)   Anemia   Constipation    Time spent: 35 minutes   [facility] Pager [phone] If 7PM-7AM, please contact night-coverage at [email or web address], password [facility] 01/05/2014, 10:33 AM  LOS: 14 days

## 2014-01-05 NOTE — Progress Notes (Signed)
CSW has sent updated clinicals to facilities and is continuing to follow for dc needs.  Jeanette Caprice, MSW, [place]

## 2014-01-05 NOTE — Progress Notes (Signed)
[facility]  S: Seen this morning without interpreter present. No acute events overnight. Tolerated HD well on 1/10.  O:BP 175/77  Pulse 74  Temp(Src) 98.6 F (37 C) (Oral)  Resp 19  Ht 5\' 4"  (1.626 m)  Wt 216 lb 7.9 oz (98.2 kg)  BMI 37.14 kg/m2  SpO2 99%  Intake/Output Summary (Last 24 hours) at 01/05/14 0854 Last data filed at 01/05/14 0100  Gross per 24 hour  Intake    240 ml  Output    800 ml  Net   -560 ml   Weight change: 7.1 oz (0.2 kg)  Gen: initially sleeping, easily wakes, in no distress  CVS: RRR, no definite murmur appreciated  Resp: scattered coarse breath sounds but normal WOB on   Abd:+ BS, soft NTND  Ext: improved bilateral symmetric 2+ edema, lower on calves with woodiness of skin, AVF LUA + bruit  Vascular access: right tunneled catheter without bleeding / drainage  Left UE dressing in place, clean / dry / intact over fistula formation wound  NEURO: Deaf, no obvious new deficit; very poor eyesight at baseline   . aspirin  325 mg Oral Daily  . calcium acetate  667 mg Oral TID WC  . cloNIDine  0.1 mg Oral TID  . darbepoetin (ARANESP) injection - NON-DIALYSIS  100 mcg Subcutaneous Q Tue-1800  . guaiFENesin  1,200 mg Oral BID  . heparin  5,000 Units Subcutaneous Q8H  . hydrALAZINE  100 mg Oral Q8H  . insulin aspart  0-5 Units Subcutaneous QHS  . insulin aspart  0-9 Units Subcutaneous TID WC  . metoprolol tartrate  100 mg Oral BID  . multivitamin  1 tablet Oral QHS  . sodium chloride  3 mL Intravenous Q12H   No results found. BMET    Component Value Date/Time   NA 136* 01/04/2014 0423   K 3.6* 01/04/2014 0423   CL 97 01/04/2014 0423   CO2 26 01/04/2014 0423   GLUCOSE 123* 01/04/2014 0423   BUN 55* 01/04/2014 0423   CREATININE 4.23* 01/04/2014 0423   CREATININE 0.76 05/23/2011 0956   CALCIUM 7.7* 01/04/2014 0423   GFRNONAA 11* 01/04/2014 0423   GFRAA 12* 01/04/2014 0423   CBC    Component Value Date/Time   WBC 8.1 12/27/2013 0420   RBC 2.65* 12/27/2013 0420   HGB 8.0* 12/27/2013 0420   HCT 24.1* 12/27/2013 0420   PLT 363 12/27/2013 0420   MCV 90.9 12/27/2013 0420   MCH 30.2 12/27/2013 0420   MCHC 33.2 12/27/2013 0420   RDW 13.5 12/27/2013 0420   LYMPHSABS 3.4 12/25/2013 0553   MONOABS 0.5 12/25/2013 0553   EOSABS 0.1 12/25/2013 0553   BASOSABS 0.0 12/25/2013 0553     Assessment: 1. New ESRD sec DM/HTN, s/p right tunneled cath placed 1/9 and left brachiocephalic fistula formation 2. HTN / DM - management per primary team 3. Vol overload, improving with HD 4. Anemia on aranesp, staretd 1/6 5. Sec HPTH  PTH 289, not on hectorol  Plan: 1.  Plan for HD today, with renal panel and CBC 2. In process of being set up for outpt dialysis 3. Pending SNF placement ([facility]  Please see also pending attending cosign for any edits / additions.  Emmaline Kluver, MD PGY-2, [facility] 01/05/2014, 8:56 AM

## 2014-01-05 NOTE — Progress Notes (Signed)
Physical Therapy Treatment Patient Details Name: [name] MRN: [id number] DOB: 02-Sep-1956 Today's Date: 01/05/2014 Time: [id number] PT Time Calculation (min): 31 min  PT Assessment / Plan / Recommendation  History of Present Illness [name] is a 58 y.o. African American female presents with SOB, ?pna,  and tachycardia. 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    PT Comments   Pt easily aroused, however repeatedly closing eyes and waving her hand (not attending to interpreter). Ultimately conveyed that she has not had a BM in days and encouraged ambulation to assist with this. Pt then agreeable. Better ambulation this date, but not yet at her baseline.   Follow Up Recommendations  SNF     Does the patient have the potential to tolerate intense rehabilitation     Barriers to Discharge        Equipment Recommendations  Rolling walker with 5" wheels    Recommendations for Other Services    Frequency Min 3X/week   Progress towards PT Goals Progress towards PT goals: Progressing toward goals  Plan Current plan remains appropriate    Precautions / Restrictions Precautions Precautions: Fall   Pertinent Vitals/Pain SaO2 97% on 3L O2     Mobility  Bed Mobility Overal bed mobility: Needs Assistance Bed Mobility: Sit to Supine Sit to supine: Supervision General bed mobility comments: incr effort and near need for assist Transfers Overall transfer level: Needs assistance Equipment used: Rolling walker (2 wheeled) Transfers: Sit to/from Stand Sit to Stand: Min guard General transfer comment: x2; visual cues and cues via interpreter for proper sequencing with RW (pt prefers one hand on RW and one on support surface) Ambulation/Gait Ambulation/Gait assistance: Min assist Ambulation Distance (Feet): 150 Feet Assistive device: Rolling  walker (2 wheeled) Gait Pattern/deviations: Step-through pattern;Decreased stride length;Trunk flexed General Gait Details: pt running into objects in room and in hallway (despite brightly lit) and requires assist to maneuver RW; at times able to avoid objects (?attention vs pure vision issue)    Exercises     PT Diagnosis:    PT Problem List:   PT Treatment Interventions:     PT Goals (current goals can now be found in the care plan section)    Visit Information  Last PT Received On: 01/05/14 Assistance Needed: +1 History of Present Illness: [name] is a 58 y.o. African American female presents with SOB, ?pna,  and tachycardia. 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     Subjective Data  Subjective: reports feeling nauseous   Cognition  Cognition Arousal/Alertness: Lethargic Behavior During Therapy: Flat affect Overall Cognitive Status: Difficult to assess Difficult to assess due to: Hard of hearing/deaf;Level of arousal (decr vision impairs her ability to read sign language)    Balance  General Comments General comments (skin integrity, edema, etc.): Frequent eye closing appeared related to flat affect not lethargy (once awakened). Required incr time to get her to maintain eyes open and communicate with sign-language interpreter  End of Session PT - End of Session Equipment Utilized During Treatment: Gait belt;Oxygen Activity Tolerance: Patient limited by fatigue;Treatment limited secondary to medical complications (Comment) (nausea) Patient left: in bed;with call bell/phone within reach;with nursing/sitter in room;Other (comment) (interpreter) Nurse Communication: Mobility status   GP     [name] 01/05/2014, 11:48 AM Pager [phone]

## 2014-01-06 LAB — CBC
HCT: 20.6 % — ABNORMAL LOW (ref 36.0–46.0)
HCT: 20.9 % — ABNORMAL LOW (ref 36.0–46.0)
Hemoglobin: 6.8 g/dL — CL (ref 12.0–15.0)
Hemoglobin: 6.9 g/dL — CL (ref 12.0–15.0)
MCH: 30.4 pg (ref 26.0–34.0)
MCH: 30.4 pg (ref 26.0–34.0)
MCHC: 33 g/dL (ref 30.0–36.0)
MCHC: 33 g/dL (ref 30.0–36.0)
MCV: 92 fL (ref 78.0–100.0)
MCV: 92.1 fL (ref 78.0–100.0)
Platelets: 386 10*3/uL (ref 150–400)
Platelets: 401 10*3/uL — ABNORMAL HIGH (ref 150–400)
RBC: 2.24 MIL/uL — ABNORMAL LOW (ref 3.87–5.11)
RBC: 2.27 MIL/uL — ABNORMAL LOW (ref 3.87–5.11)
RDW: 13.8 % (ref 11.5–15.5)
RDW: 14 % (ref 11.5–15.5)
WBC: 10.2 10*3/uL (ref 4.0–10.5)
WBC: 10.6 10*3/uL — ABNORMAL HIGH (ref 4.0–10.5)

## 2014-01-06 LAB — GLUCOSE, CAPILLARY
Glucose-Capillary: 162 mg/dL — ABNORMAL HIGH (ref 70–99)
Glucose-Capillary: 140 mg/dL — ABNORMAL HIGH (ref 70–99)
Glucose-Capillary: 184 mg/dL — ABNORMAL HIGH (ref 70–99)
Glucose-Capillary: 165 mg/dL — ABNORMAL HIGH (ref 70–99)

## 2014-01-06 MED ORDER — POLYETHYLENE GLYCOL 3350 17 G PO PACK
17.0000 g | PACK | Freq: Once | ORAL | Status: DC
  Filled 2014-01-06: qty 1

## 2014-01-06 MED ORDER — POLYETHYLENE GLYCOL 3350 17 G PO PACK
17.0000 g | PACK | Freq: Once | ORAL | Status: AC
Start: 2014-01-06 — End: 2014-01-06
  Administered 2014-01-06: 17 g via ORAL
  Filled 2014-01-06: qty 1

## 2014-01-06 NOTE — Progress Notes (Signed)
[facility] PROGRESS NOTE  [name] [id number] DOB: [birth year] DOA: 12/22/2013 PCP: Pcp Not In System  Assessment/Plan: 1. CKD stage 4 : HD tomorrow (MWF) 2. Anemia: new low hgb today unexplained drop from 9.7>>6.9 overnight with no observed bleeding.  Has not had BM, will work towards today and guiac stool.  Does have some abd pain and if stool negative may need to image abdomen to RO intra/retroperitoneal bleed. 3. HYPERTENSION, ESSENTIAL,  Remains UNCONTROLLED : Elevated even after HD.  For now continue with lopressor, hydralazine,  increased catapres yesterday. 4. ?? DM type 2 -previous HgA1c was 9.2 in 2012, this admit 5.9. At present has diet controlled DM and no indication to begin medication  5. PSYCHIATRIC DISORDER/DEAF/Depression: patient calm and no emotional distress. Have spoken with family at length and they are opposed to use of prozac. I have discontinued this medication.  She has been seen by psychiatry during admission and has competency.  Will continue to monitor and discuss with family.  She understands that the medication was discontinued and states that her mood is stable.   6. Acute respiratory failure with hypoxia/Community acquired pneumonia: resolved.  No respiratory issues today. Has completed 8 d of Levaquin. continues on mucinex, nebs, 02. Flu negative. 7. Tachycardia/SVT: No further episodes.Not currently on tele. 8. Severe concentric left ventricular hypertrophy/moderate pulmonary HTN/grade 1 diastolic dysfunction: Apparent new findings- no prior ECHO to compare - ?? HOCM -? of underlying amyloid. Siglin term uncontrolled hypertension.  Cardiology has seen and recommended outpatient followup.  9. HYPERLIPIDEMIA : continue statin 10. Hearing loss: sign language interpreter present for communication 11. Vision loss: this will need to be evaluated as an outpatient. Very important for this sign language dependant patient. 12. Dispo: DC to [facility] once stable  continue HD from there. Needs outpatient HD slot.  Code Status: full Family Communication: spoke with patient via interpreter. Disposition Plan: inpatient. Will dc to SNF when medically stable.   Consultants: Cardiology  Nephrology  Psychiatry   Procedures:  none  Antibiotics: Zithromax 12/29 >>> 12/27/13  Rocephin 12/29 >>> 12/27/13  levaquin 12/27/13>>>>12/30/13   HPI/Subjective: Alert, communicating well via interpreter. Feels weak. No specific complaints.  Objective: Filed Vitals:   01/06/14 0443  BP: 165/65  Pulse: 71  Temp: 98.7 F (37.1 C)  Resp: 18    Intake/Output Summary (Last 24 hours) at 01/06/14 1041 Last data filed at 01/06/14 0444  Gross per 24 hour  Intake    240 ml  Output   3550 ml  Net  -3310 ml   Filed Weights   01/05/14 1838 01/06/14 0316 01/06/14 0443  Weight: 96 kg (211 lb 10.3 oz) 95.89 kg (211 lb 6.4 oz) 95.89 kg (211 lb 6.4 oz)    Exam:   General:  NAD, calm, alert  Cardiovascular: RRR  Respiratory: scattered rhonchi, good air movement, some coughing  Abdomen: BS+, soft, non tender  Musculoskeletal: no lower extremity edema. Left arm/hand edematous, not tender  Data Reviewed: Basic Metabolic Panel:  Recent Labs Lab 01/01/14 0401 01/02/14 0528 01/03/14 0312 01/04/14 0423 01/05/14 1411  NA 136* 136* 136* 136* 134*  K 3.6* 3.2* 3.9 3.6* 3.9  CL 97 99 99 97 97  CO2 22 21 22 26 25   GLUCOSE 119* 115* 118* 123* 173*  BUN 76* 77* 76* 55* 60*  CREATININE 4.67* 4.72* 4.99* 4.23* 4.62*  CALCIUM 8.2* 7.7* 7.7* 7.7* 7.8*  PHOS 7.6* 7.5* 7.3* 4.8* 5.0*   Liver Function Tests:  Recent Labs  Lab 01/01/14 0401 01/02/14 0528 01/03/14 0312 01/04/14 0423 01/05/14 1411  ALBUMIN 1.7* 1.4* 1.4* 1.7* 1.7*   No results found for this basename: LIPASE, AMYLASE,  in the last 168 hours No results found for this basename: AMMONIA,  in the last 168 hours CBC:  Recent Labs Lab 01/05/14 1410 01/06/14 0438 01/06/14 0948  WBC 8.0  10.2 10.6*  HGB 9.7* 6.8* 6.9*  HCT 29.3* 20.6* 20.9*  MCV 90.7 92.0 92.1  PLT 347 386 401*   Cardiac Enzymes: No results found for this basename: CKTOTAL, CKMB, CKMBINDEX, TROPONINI,  in the last 168 hours BNP (last 3 results) No results found for this basename: PROBNP,  in the last 8760 hours CBG:  Recent Labs Lab 01/04/14 2100 01/05/14 0625 01/05/14 1053 01/05/14 2043 01/06/14 0635  GLUCAP 155* 121* 118* 110* 162*    No results found for this or any previous visit (from the past 240 hour(s)).   Studies: No results found.  Scheduled Meds: . aspirin  325 mg Oral Daily  . calcium acetate  667 mg Oral TID WC  . cloNIDine  0.1 mg Oral TID  . darbepoetin (ARANESP) injection - NON-DIALYSIS  100 mcg Subcutaneous Q Tue-1800  . guaiFENesin  1,200 mg Oral BID  . heparin  5,000 Units Subcutaneous Q8H  . hydrALAZINE  100 mg Oral Q8H  . insulin aspart  0-5 Units Subcutaneous QHS  . insulin aspart  0-9 Units Subcutaneous TID WC  . metoprolol tartrate  100 mg Oral BID  . multivitamin  1 tablet Oral QHS  . polyethylene glycol  17 g Oral Daily  . simvastatin  20 mg Oral q1800  . sodium chloride  3 mL Intravenous Q12H   Continuous Infusions:   Principal Problem:   Acute respiratory failure with hypoxia Active Problems:   HYPERLIPIDEMIA   PSYCHIATRIC DISORDER   DEAF MUTISM   HYPERTENSION, ESSENTIAL, UNCONTROLLED   LEG EDEMA   Community acquired pneumonia   DM type 2 causing CKD stage 4   Acute renal failure   SVT (supraventricular tachycardia)   NSVT (nonsustained ventricular tachycardia)   Abnormal echocardiogram   Chronic diastolic CHF (congestive heart failure)   Anemia   Constipation    Time spent: 35 minutes   [facility] Pager [phone] If 7PM-7AM, please contact night-coverage at [email or web address], password [facility] 01/06/2014, 10:41 AM  LOS: 15 days

## 2014-01-06 NOTE — Progress Notes (Signed)
01/06/2014 3:09 PM Hemodialysis Outpatient Note; this patient has been accepted at the [facility] center on a Monday,Wednesday and Friday schedule, The center requests the patient show up at 10:30 for paperwork and consents.I have left a message with the social worker to inform me when this patient might be discharged. Thank you. Gordy Savers

## 2014-01-06 NOTE — Progress Notes (Signed)
01/06/2014 1630 Dr. Volanda Napoleon on floor. Verbal orders received to administer second dose of scheduled Miralax now instead of at bedtime. Orders enacted. Will continue to monitor patient.  [name], Arville Lime

## 2014-01-06 NOTE — Progress Notes (Signed)
01/06/2014 2:12 PM Nursing note Dr. Volanda Napoleon paged and made aware of CBC results from this AM. Verbal orders received to d/c heparin SQ and start SCD's for VTE prophylaxis. Orders enacted.  Dream Harman, Arville Lime

## 2014-01-06 NOTE — Progress Notes (Signed)
CRITICAL VALUE ALERT  Critical value received:  Hbg 6.8  Date of notification:  01/06/2014  Time of notification:  0555  Critical value read back:yes  Nurse who received alert:  Nolon Nations  MD notified (1st page):  [facility]  Time of first page:  0557  MD notified (2nd page):  Time of second page:  Responding MD:  yes  Time MD responded:  [phone]

## 2014-01-06 NOTE — Progress Notes (Signed)
[facility]  S: Seen this morning without interpreter present. No acute events overnight. Appears to be eating better.  O:BP 165/65  Pulse 71  Temp(Src) 98.7 F (37.1 C) (Oral)  Resp 18  Ht 5\' 4"  (1.626 m)  Wt 211 lb 6.4 oz (95.89 kg)  BMI 36.27 kg/m2  SpO2 100%  Intake/Output Summary (Last 24 hours) at 01/06/14 0853 Last data filed at 01/06/14 0444  Gross per 24 hour  Intake    240 ml  Output   3550 ml  Net  -3310 ml   Weight change: 1 lb 12.2 oz (0.8 kg)  Gen: sitting up in chair eating breakfast, NAD CVS: RRR, no murmur appreciated Resp: remains with scattered coarse breath sounds but normal WOB on [place]  Abd:+ BS, soft NTND  Ext: improved bilateral symmetric 1-2+ edema, lower on calves with woodiness of skin, AVF LUA + bruit  Vascular access: right tunneled catheter without bleeding / drainage  Left UE dressing in place, clean / dry / intact over fistula formation wound  NEURO: Deaf, no obvious new deficit; very poor eyesight at baseline   . aspirin  325 mg Oral Daily  . calcium acetate  667 mg Oral TID WC  . cloNIDine  0.1 mg Oral TID  . darbepoetin (ARANESP) injection - NON-DIALYSIS  100 mcg Subcutaneous Q Tue-1800  . guaiFENesin  1,200 mg Oral BID  . heparin  5,000 Units Subcutaneous Q8H  . hydrALAZINE  100 mg Oral Q8H  . insulin aspart  0-5 Units Subcutaneous QHS  . insulin aspart  0-9 Units Subcutaneous TID WC  . metoprolol tartrate  100 mg Oral BID  . multivitamin  1 tablet Oral QHS  . polyethylene glycol  17 g Oral Daily  . simvastatin  20 mg Oral q1800  . sodium chloride  3 mL Intravenous Q12H   No results found. BMET    Component Value Date/Time   NA 134* 01/05/2014 1411   K 3.9 01/05/2014 1411   CL 97 01/05/2014 1411   CO2 25 01/05/2014 1411   GLUCOSE 173* 01/05/2014 1411   BUN 60* 01/05/2014 1411   CREATININE 4.62* 01/05/2014 1411   CREATININE 0.76 05/23/2011 0956   CALCIUM 7.8* 01/05/2014 1411   GFRNONAA 10* 01/05/2014 1411   GFRAA 11*  01/05/2014 1411   CBC    Component Value Date/Time   WBC 10.2 01/06/2014 0438   RBC 2.24* 01/06/2014 0438   HGB 6.8* 01/06/2014 0438   HCT 20.6* 01/06/2014 0438   PLT 386 01/06/2014 0438   MCV 92.0 01/06/2014 0438   MCH 30.4 01/06/2014 0438   MCHC 33.0 01/06/2014 0438   RDW 13.8 01/06/2014 0438   LYMPHSABS 3.4 12/25/2013 0553   MONOABS 0.5 12/25/2013 0553   EOSABS 0.1 12/25/2013 0553   BASOSABS 0.0 12/25/2013 0553     Assessment: 1. New ESRD sec DM/HTN, s/p right tunneled cath placed 1/9 and left brachiocephalic fistula formation 2. HTN / DM - management per primary team 3. Vol overload, improving with HD 4. Anemia on aranesp, staretd 1/6 - acute drop to 6.8 1/13 5. Sec HPTH  PTH 289, not on hectorol  Plan: 1. HD tomorrow 2. Type and screen ordered by primary team, ?transfuse 1/13 3. In process of being set up for outpt dialysis 4. Pending SNF placement [facility])  Please see also pending attending cosign for any edits / additions.  Emmaline Kluver, MD PGY-2, [facility] 01/06/2014, 8:53 AM

## 2014-01-06 NOTE — Progress Notes (Signed)
01/06/2014 0900 During am progression rounds, Dr. Volanda Napoleon made aware of pt. Reports of no BM x 2 weeks and hypoactive bowel sounds. Verbal orders received to administer scheduled Miralax BID today only then resume prior schedule. Orders enacted. Will continue to monitor patient.  [name], Arville Lime

## 2014-01-06 NOTE — Progress Notes (Signed)
Physical Therapy Treatment Patient Details Name: [name] MRN: [id number] DOB: 03/10/1956 Today's Date: 01/06/2014 Time: [id number] PT Time Calculation (min): 24 min  PT Assessment / Plan / Recommendation  History of Present Illness [name] is a 58 y.o. African American female presents with SOB, ?pna,  and tachycardia. 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    PT Comments   Pt brighter today and did better with her vision (reading large print and avoiding obstacles in room and hallway). Did desaturate to 86% on RA during ambulation and incr to 99% on 2L.    Follow Up Recommendations  SNF     Does the patient have the potential to tolerate intense rehabilitation     Barriers to Discharge        Equipment Recommendations  Rolling walker with 5" wheels;Other (comment) (likely home O2)    Recommendations for Other Services    Frequency Min 3X/week   Progress towards PT Goals Progress towards PT goals: Progressing toward goals  Plan Current plan remains appropriate    Precautions / Restrictions Precautions Precautions: Fall   Pertinent Vitals/Pain SaO2 93% on RA; ambulating decr to 86% on RA (after 50 ft); on 2L up to 99% while walking    Mobility  Bed Mobility Overal bed mobility: Needs Assistance Bed Mobility: Rolling;Sidelying to Sit Rolling: Modified independent (Device/Increase time) Sidelying to sit: Supervision General bed mobility comments: used rail, supervision for safety; pt stronger today Transfers Overall transfer level: Needs assistance Equipment used: Rolling walker (2 wheeled) Transfers: Sit to/from Stand Sit to Stand: Min guard General transfer comment: steady assist as pt pulls up on RW; no loss of balance Ambulation/Gait Ambulation/Gait assistance: Min guard Ambulation Distance (Feet): 150  Feet Assistive device: Rolling walker (2 wheeled) Gait Pattern/deviations: Step-through pattern;Decreased stride length General Gait Details: pt able to avoid objects in hallway without prompts or assist    Exercises     PT Diagnosis:    PT Problem List:   PT Treatment Interventions:     PT Goals (current goals can now be found in the care plan section) Acute Rehab PT Goals PT Goal Formulation: With patient Time For Goal Achievement: 01/19/14 Potential to Achieve Goals: Good  Visit Information  Last PT Received On: 01/06/14 Assistance Needed: +1 History of Present Illness: [name] is a 58 y.o. African American female presents with SOB, ?pna,  and tachycardia. 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     Subjective Data      Cognition  Cognition Arousal/Alertness: Awake/alert Behavior During Therapy: WFL for tasks assessed/performed Overall Cognitive Status: Difficult to assess Difficult to assess due to: Hard of hearing/deaf (pt able to write/read large print for communication)    Balance  General Comments General comments (skin integrity, edema, etc.): pt easily encouraged today to get OOB and walk. Explained it will help her stomach (pt wanting to have a BM).  End of Session PT - End of Session Equipment Utilized During Treatment: Gait belt;Oxygen Activity Tolerance: Patient tolerated treatment well (nausea) Patient left: with call bell/phone within reach;with nursing/sitter in room;in chair Nurse Communication: Mobility status;Other (comment) (SaO2 on RA)   GP     Thetis Schwimmer 01/06/2014, 5:25 PM Pager [phone]

## 2014-01-06 NOTE — Progress Notes (Signed)
I have seen and examined this patient and agree with the plan of care  [name] W 01/06/2014, 11:08 AM  

## 2014-01-07 LAB — CBC
HCT: 20.8 % — ABNORMAL LOW (ref 36.0–46.0)
Hemoglobin: 6.9 g/dL — CL (ref 12.0–15.0)
MCH: 30.5 pg (ref 26.0–34.0)
MCHC: 33.2 g/dL (ref 30.0–36.0)
MCV: 92 fL (ref 78.0–100.0)
Platelets: 370 10*3/uL (ref 150–400)
RBC: 2.26 MIL/uL — ABNORMAL LOW (ref 3.87–5.11)
RDW: 13.8 % (ref 11.5–15.5)
WBC: 9.5 10*3/uL (ref 4.0–10.5)

## 2014-01-07 LAB — PREPARE RBC (CROSSMATCH)

## 2014-01-07 LAB — RENAL FUNCTION PANEL
Albumin: 1.6 g/dL — ABNORMAL LOW (ref 3.5–5.2)
BUN: 50 mg/dL — ABNORMAL HIGH (ref 6–23)
CO2: 25 mEq/L (ref 19–32)
Calcium: 7.9 mg/dL — ABNORMAL LOW (ref 8.4–10.5)
Chloride: 94 mEq/L — ABNORMAL LOW (ref 96–112)
Creatinine, Ser: 4.07 mg/dL — ABNORMAL HIGH (ref 0.50–1.10)
GFR calc Af Amer: 13 mL/min — ABNORMAL LOW (ref >90)
GFR calc non Af Amer: 11 mL/min — ABNORMAL LOW (ref >90)
Glucose, Bld: 151 mg/dL — ABNORMAL HIGH (ref 70–99)
Phosphorus: 4.2 mg/dL (ref 2.3–4.6)
Potassium: 3.6 mEq/L — ABNORMAL LOW (ref 3.7–5.3)
Sodium: 131 mEq/L — ABNORMAL LOW (ref 137–147)

## 2014-01-07 LAB — GLUCOSE, CAPILLARY
Glucose-Capillary: 144 mg/dL — ABNORMAL HIGH (ref 70–99)
Glucose-Capillary: 98 mg/dL (ref 70–99)
Glucose-Capillary: 183 mg/dL — ABNORMAL HIGH (ref 70–99)
Glucose-Capillary: 110 mg/dL — ABNORMAL HIGH (ref 70–99)

## 2014-01-07 MED ORDER — LACTULOSE 10 GM/15ML PO SOLN
20.0000 g | Freq: Two times a day (BID) | ORAL | Status: DC | PRN
  Administered 2014-01-07: 20 g via ORAL
  Filled 2014-01-07 (×2): qty 30

## 2014-01-07 MED ORDER — MILK AND MOLASSES ENEMA
1.0000 | Freq: Once | RECTAL | Status: DC
  Filled 2014-01-07: qty 250

## 2014-01-07 MED ORDER — ISOSORBIDE MONONITRATE ER 30 MG PO TB24
30.0000 mg | ORAL_TABLET | Freq: Every day | ORAL | Status: DC
  Administered 2014-01-07 – 2014-01-08 (×2): 30 mg via ORAL
  Filled 2014-01-07 (×3): qty 1

## 2014-01-07 MED ORDER — DOCUSATE SODIUM 100 MG PO CAPS
100.0000 mg | ORAL_CAPSULE | Freq: Two times a day (BID) | ORAL | Status: DC
  Administered 2014-01-07: 100 mg via ORAL
  Filled 2014-01-07 (×3): qty 1

## 2014-01-07 NOTE — Progress Notes (Signed)
I have seen and examined this patient and agree with the plan of care. Patient stable and seen on dialysis. Appears to be tolerating the procedure well. Hemoglobin drop that has stabilized. Will defer to primary team  [facility] W 01/07/2014, 8:52 AM

## 2014-01-07 NOTE — Progress Notes (Signed)
[facility] PROGRESS NOTE  [name] [id number] DOB: 03-12-1956 DOA: 12/22/2013 PCP: Pcp Not In System  Assessment/Plan: 1. CKD stage 4 : Patient undergoing hemodialysis on Mondays Wednesdays and Fridays. Hemodialysis was started on 01/04/2014. Outpatient dialysis has been set up. 2. Anemia: Patient's hemoglobin trended down to 6.9 on this mornings lab work. Status post anesthesia with 2 units of packed red blood cells during hemodialysis today. No clinical evidence of GI bleed we'll continue to monitor. 3. HYPERTENSION, ESSENTIAL, her blood pressures remain elevated with systolic blood pressures in the 170s to 180s. Will add Imdur 30 mg by mouth daily. 4. DM type 2 -previous HgA1c was 9.2 in 2012, this admit 5.9. At present has diet controlled DM and no indication to begin medication  5. PSYCHIATRIC DISORDER/DEAF/Depression: patient calm and no emotional distress. Have spoken with family at length and they are opposed to use of prozac. I have discontinued this medication.  She has been seen by psychiatry during admission and has competency.  Will continue to monitor and discuss with family.  She understands that the medication was discontinued and states that her mood is stable.   6. Acute respiratory failure with hypoxia/Community acquired pneumonia: resolved.  No respiratory issues today. Has completed 8 d of Levaquin. continues on mucinex, nebs, 02. Flu negative. 7. Tachycardia/SVT: No further episodes.Not currently on tele. 8. Severe concentric left ventricular hypertrophy/moderate pulmonary HTN/grade 1 diastolic dysfunction: Apparent new findings- no prior ECHO to compare - ?? HOCM -? of underlying amyloid. [place] term uncontrolled hypertension.  Cardiology has seen and recommended outpatient followup.  9. HYPERLIPIDEMIA : continue statin 10. Hearing loss: sign language interpreter present for communication 11. Vision loss: this will need to be evaluated as an outpatient. Very  important for this sign language dependant patient. 12. Dispo: DC to [facility] once stable continue HD from there. Needs outpatient HD slot.  Code Status: full Family Communication: spoke with patient via interpreter. Disposition Plan: Plan for transfer to a skilled nursing facility in the next 24-48 hours. Outpatient hemodialysis has been set up.   Consultants: Cardiology  Nephrology  Psychiatry   Procedures:  none  Antibiotics: Zithromax 12/29 >>> 12/27/13  Rocephin 12/29 >>> 12/27/13  levaquin 12/27/13>>>>12/30/13   HPI/Subjective: Patient's being seen after hemodialysis, she is awake alert, making eye contact. Nontoxic appearing, tolerating by mouth intake  Objective: Filed Vitals:   01/07/14 1612  BP: 186/77  Pulse: 78  Temp:   Resp:     Intake/Output Summary (Last 24 hours) at 01/07/14 1725 Last data filed at 01/07/14 1330  Gross per 24 hour  Intake    890 ml  Output   3350 ml  Net  -2460 ml   Filed Weights   01/07/14 0340 01/07/14 0814 01/07/14 1221  Weight: 97.07 kg (214 lb) 96.8 kg (213 lb 6.5 oz) 93.8 kg (206 lb 12.7 oz)    Exam:   General:  NAD, calm, alert  Cardiovascular: RRR  Respiratory: scattered rhonchi, good air movement, some coughing  Abdomen: BS+, soft, non tender  Musculoskeletal: no lower extremity edema. Left arm/hand edematous, not tender  Data Reviewed: Basic Metabolic Panel:  Recent Labs Lab 01/02/14 0528 01/03/14 0312 01/04/14 0423 01/05/14 1411 01/07/14 0520  NA 136* 136* 136* 134* 131*  K 3.2* 3.9 3.6* 3.9 3.6*  CL 99 99 97 97 94*  CO2 21 22 26 25 25   GLUCOSE 115* 118* 123* 173* 151*  BUN 77* 76* 55* 60* 50*  CREATININE 4.72* 4.99* 4.23* 4.62*  4.07*  CALCIUM 7.7* 7.7* 7.7* 7.8* 7.9*  PHOS 7.5* 7.3* 4.8* 5.0* 4.2   Liver Function Tests:  Recent Labs Lab 01/02/14 0528 01/03/14 0312 01/04/14 0423 01/05/14 1411 01/07/14 0520  ALBUMIN 1.4* 1.4* 1.7* 1.7* 1.6*   No results found for this basename: LIPASE,  AMYLASE,  in the last 168 hours No results found for this basename: AMMONIA,  in the last 168 hours CBC:  Recent Labs Lab 01/05/14 1410 01/06/14 0438 01/06/14 0948 01/07/14 0829  WBC 8.0 10.2 10.6* 9.5  HGB 9.7* 6.8* 6.9* 6.9*  HCT 29.3* 20.6* 20.9* 20.8*  MCV 90.7 92.0 92.1 92.0  PLT 347 386 401* 370   Cardiac Enzymes: No results found for this basename: CKTOTAL, CKMB, CKMBINDEX, TROPONINI,  in the last 168 hours BNP (last 3 results) No results found for this basename: PROBNP,  in the last 8760 hours CBG:  Recent Labs Lab 01/06/14 1627 01/06/14 2040 01/07/14 0610 01/07/14 1320 01/07/14 1637  GLUCAP 184* 165* 144* 98 183*    No results found for this or any previous visit (from the past 240 hour(s)).   Studies: No results found.  Scheduled Meds: . aspirin  325 mg Oral Daily  . calcium acetate  667 mg Oral TID WC  . cloNIDine  0.1 mg Oral TID  . darbepoetin (ARANESP) injection - NON-DIALYSIS  100 mcg Subcutaneous Q Tue-1800  . docusate sodium  100 mg Oral BID  . guaiFENesin  1,200 mg Oral BID  . hydrALAZINE  100 mg Oral Q8H  . insulin aspart  0-5 Units Subcutaneous QHS  . insulin aspart  0-9 Units Subcutaneous TID WC  . metoprolol tartrate  100 mg Oral BID  . milk and molasses  1 enema Rectal Once  . multivitamin  1 tablet Oral QHS  . polyethylene glycol  17 g Oral Daily  . simvastatin  20 mg Oral q1800  . sodium chloride  3 mL Intravenous Q12H   Continuous Infusions:   Principal Problem:   Acute respiratory failure with hypoxia Active Problems:   HYPERLIPIDEMIA   PSYCHIATRIC DISORDER   DEAF MUTISM   HYPERTENSION, ESSENTIAL, UNCONTROLLED   LEG EDEMA   Community acquired pneumonia   DM type 2 causing CKD stage 4   Acute renal failure   SVT (supraventricular tachycardia)   NSVT (nonsustained ventricular tachycardia)   Abnormal echocardiogram   Chronic diastolic CHF (congestive heart failure)   Anemia   Constipation    Time spent: 30  minutes   Kelvin Cellar  [facility] Pager [phone] If 7PM-7AM, please contact night-coverage at [email or web address], password [facility] 01/07/2014, 5:25 PM  LOS: 16 days

## 2014-01-07 NOTE — Progress Notes (Signed)
Occupational Therapy Cancellation Note  Attempted to see pt this am, however, she is in HD.  Will reattempt.  Lucille Passy, OTR/L [phone]

## 2014-01-07 NOTE — Progress Notes (Signed)
01/07/2014 2:08 PM Nursing note Pt. Nodded head "yes" to written communication asking about bowel movements from yesterday. Pt. Indicated a small bowel movement occurred. Will continue to monitor. [name], Arville Lime

## 2014-01-07 NOTE — Progress Notes (Signed)
[facility]  S: Seen on HD this morning without interpreter present. No acute events overnight. No acute complaints / events per nursing.   O:BP 184/81  Pulse 66  Temp(Src) 97 F (36.1 C) (Oral)  Resp 18  Ht 5\' 4"  (1.626 m)  Wt 213 lb 6.5 oz (96.8 kg)  BMI 36.61 kg/m2  SpO2 100%  Intake/Output Summary (Last 24 hours) at 01/07/14 [id number] Last data filed at 01/06/14 2100  Gross per 24 hour  Intake    480 ml  Output    850 ml  Net   -370 ml   UOP 850 1/13 0700 to 1/14 0700  Weight change: -4 lb 4.1 oz (-1.93 kg)  Gen: adult female, lying in bed on HD, NAD CVS: RRR, no murmur appreciated Resp: breath sounds clearer, still nonfocal occasional rhonchi but normal WOB on [place]  Abd:+ BS, soft NTND  Ext: improved bilateral symmetric 1+ edema, AVF LUA + bruit  Vascular access: right tunneled catheter without bleeding / drainage  Left UE dressing in place, clean / dry / intact over fistula formation wound  NEURO: Deaf, no obvious new deficit; very poor eyesight at baseline   . aspirin  325 mg Oral Daily  . calcium acetate  667 mg Oral TID WC  . cloNIDine  0.1 mg Oral TID  . darbepoetin (ARANESP) injection - NON-DIALYSIS  100 mcg Subcutaneous Q Tue-1800  . docusate sodium  100 mg Oral BID  . guaiFENesin  1,200 mg Oral BID  . hydrALAZINE  100 mg Oral Q8H  . insulin aspart  0-5 Units Subcutaneous QHS  . insulin aspart  0-9 Units Subcutaneous TID WC  . metoprolol tartrate  100 mg Oral BID  . milk and molasses  1 enema Rectal Once  . multivitamin  1 tablet Oral QHS  . polyethylene glycol  17 g Oral Daily  . simvastatin  20 mg Oral q1800  . sodium chloride  3 mL Intravenous Q12H   No results found. BMET    Component Value Date/Time   NA 131* 01/07/2014 0520   K 3.6* 01/07/2014 0520   CL 94* 01/07/2014 0520   CO2 25 01/07/2014 0520   GLUCOSE 151* 01/07/2014 0520   BUN 50* 01/07/2014 0520   CREATININE 4.07* 01/07/2014 0520   CREATININE 0.76 05/23/2011 0956   CALCIUM 7.9*  01/07/2014 0520   GFRNONAA 11* 01/07/2014 0520   GFRAA 13* 01/07/2014 0520   CBC    Component Value Date/Time   WBC 9.5 01/07/2014 0829   RBC 2.26* 01/07/2014 0829   HGB 6.9* 01/07/2014 0829   HCT 20.8* 01/07/2014 0829   PLT 370 01/07/2014 0829   MCV 92.0 01/07/2014 0829   MCH 30.5 01/07/2014 0829   MCHC 33.2 01/07/2014 0829   RDW 13.8 01/07/2014 0829   LYMPHSABS 3.4 12/25/2013 0553   MONOABS 0.5 12/25/2013 0553   EOSABS 0.1 12/25/2013 0553   BASOSABS 0.0 12/25/2013 0553     Assessment: 1. New ESRD sec DM/HTN, s/p right tunneled cath placed 1/9 and left brachiocephalic fistula formation 2. HTN / DM - management per primary team 3. Vol overload, improving with HD 4. Anemia on aranesp, staretd 1/6 - acute drop to 6.8 1/13 5. Sec HPTH  PTH 289, not on hectorol  Plan: 1. HD today, goal 3L off 2. CBC this morning 6.9, will transfuse 2 units on HD 3. In process of being set up for outpt dialysis 4. Pending SNF placement [facility])  Please see also pending  attending cosign for any edits / additions.  Emmaline Kluver, MD PGY-2, [facility] 01/07/2014, 8:52 AM

## 2014-01-07 NOTE — Progress Notes (Signed)
CSW spoke to [name] in dialysis to explain that per MD, patient may be ready tomorrow. CSW will update patient, and patient's family later today.  Jeanette Caprice, MSW, [place]

## 2014-01-07 NOTE — Progress Notes (Signed)
01/07/2014 1:34 PM Hemodialysis Outpatient Note;  this patient has been accepted at the [facility] center on a Monday,Wednesday and Friday 2nd shift schedule. The center can begin treatment on Friday January 17,2015. Patient must arrive at the center at 10:30 am to sign consents and paperwork. Thank You. Gordy Savers

## 2014-01-08 DIAGNOSIS — N2581 Secondary hyperparathyroidism of renal origin: Secondary | ICD-10-CM | POA: Diagnosis not present

## 2014-01-08 DIAGNOSIS — D631 Anemia in chronic kidney disease: Secondary | ICD-10-CM | POA: Diagnosis not present

## 2014-01-08 DIAGNOSIS — D509 Iron deficiency anemia, unspecified: Secondary | ICD-10-CM | POA: Diagnosis not present

## 2014-01-08 DIAGNOSIS — M25559 Pain in unspecified hip: Secondary | ICD-10-CM | POA: Diagnosis not present

## 2014-01-08 DIAGNOSIS — I509 Heart failure, unspecified: Secondary | ICD-10-CM

## 2014-01-08 DIAGNOSIS — I119 Hypertensive heart disease without heart failure: Secondary | ICD-10-CM | POA: Diagnosis not present

## 2014-01-08 DIAGNOSIS — N289 Disorder of kidney and ureter, unspecified: Secondary | ICD-10-CM | POA: Diagnosis not present

## 2014-01-08 DIAGNOSIS — H269 Unspecified cataract: Secondary | ICD-10-CM | POA: Diagnosis not present

## 2014-01-08 DIAGNOSIS — J189 Pneumonia, unspecified organism: Secondary | ICD-10-CM | POA: Diagnosis not present

## 2014-01-08 DIAGNOSIS — D649 Anemia, unspecified: Secondary | ICD-10-CM | POA: Diagnosis not present

## 2014-01-08 DIAGNOSIS — N179 Acute kidney failure, unspecified: Secondary | ICD-10-CM | POA: Diagnosis not present

## 2014-01-08 DIAGNOSIS — R488 Other symbolic dysfunctions: Secondary | ICD-10-CM | POA: Diagnosis not present

## 2014-01-08 DIAGNOSIS — Z5189 Encounter for other specified aftercare: Secondary | ICD-10-CM | POA: Diagnosis not present

## 2014-01-08 DIAGNOSIS — I10 Essential (primary) hypertension: Secondary | ICD-10-CM | POA: Diagnosis not present

## 2014-01-08 DIAGNOSIS — Z992 Dependence on renal dialysis: Secondary | ICD-10-CM | POA: Diagnosis not present

## 2014-01-08 DIAGNOSIS — H2510 Age-related nuclear cataract, unspecified eye: Secondary | ICD-10-CM | POA: Diagnosis not present

## 2014-01-08 DIAGNOSIS — R000 Tachycardia, unspecified: Secondary | ICD-10-CM | POA: Diagnosis not present

## 2014-01-08 DIAGNOSIS — I5032 Chronic diastolic (congestive) heart failure: Secondary | ICD-10-CM | POA: Diagnosis not present

## 2014-01-08 DIAGNOSIS — I498 Other specified cardiac arrhythmias: Secondary | ICD-10-CM | POA: Diagnosis not present

## 2014-01-08 DIAGNOSIS — J9600 Acute respiratory failure, unspecified whether with hypoxia or hypercapnia: Secondary | ICD-10-CM | POA: Diagnosis not present

## 2014-01-08 DIAGNOSIS — R6889 Other general symptoms and signs: Secondary | ICD-10-CM | POA: Diagnosis not present

## 2014-01-08 DIAGNOSIS — H35349 Macular cyst, hole, or pseudohole, unspecified eye: Secondary | ICD-10-CM | POA: Diagnosis present

## 2014-01-08 DIAGNOSIS — F39 Unspecified mood [affective] disorder: Secondary | ICD-10-CM | POA: Diagnosis not present

## 2014-01-08 DIAGNOSIS — I120 Hypertensive chronic kidney disease with stage 5 chronic kidney disease or end stage renal disease: Secondary | ICD-10-CM | POA: Diagnosis present

## 2014-01-08 DIAGNOSIS — E1139 Type 2 diabetes mellitus with other diabetic ophthalmic complication: Secondary | ICD-10-CM | POA: Diagnosis not present

## 2014-01-08 DIAGNOSIS — N184 Chronic kidney disease, stage 4 (severe): Secondary | ICD-10-CM | POA: Diagnosis not present

## 2014-01-08 DIAGNOSIS — Z9889 Other specified postprocedural states: Secondary | ICD-10-CM | POA: Diagnosis not present

## 2014-01-08 DIAGNOSIS — IMO0001 Reserved for inherently not codable concepts without codable children: Secondary | ICD-10-CM | POA: Diagnosis not present

## 2014-01-08 DIAGNOSIS — E785 Hyperlipidemia, unspecified: Secondary | ICD-10-CM | POA: Diagnosis not present

## 2014-01-08 DIAGNOSIS — Z79899 Other long term (current) drug therapy: Secondary | ICD-10-CM | POA: Diagnosis not present

## 2014-01-08 DIAGNOSIS — F489 Nonpsychotic mental disorder, unspecified: Secondary | ICD-10-CM | POA: Diagnosis not present

## 2014-01-08 DIAGNOSIS — H338 Other retinal detachments: Secondary | ICD-10-CM | POA: Diagnosis present

## 2014-01-08 DIAGNOSIS — H913 Deaf nonspeaking, not elsewhere classified: Secondary | ICD-10-CM | POA: Diagnosis not present

## 2014-01-08 DIAGNOSIS — Z4931 Encounter for adequacy testing for hemodialysis: Secondary | ICD-10-CM | POA: Diagnosis not present

## 2014-01-08 DIAGNOSIS — H3340 Traction detachment of retina, unspecified eye: Secondary | ICD-10-CM | POA: Diagnosis not present

## 2014-01-08 DIAGNOSIS — N186 End stage renal disease: Secondary | ICD-10-CM | POA: Diagnosis not present

## 2014-01-08 DIAGNOSIS — H3560 Retinal hemorrhage, unspecified eye: Secondary | ICD-10-CM | POA: Diagnosis present

## 2014-01-08 DIAGNOSIS — R9389 Abnormal findings on diagnostic imaging of other specified body structures: Secondary | ICD-10-CM

## 2014-01-08 DIAGNOSIS — E119 Type 2 diabetes mellitus without complications: Secondary | ICD-10-CM | POA: Diagnosis not present

## 2014-01-08 DIAGNOSIS — E11359 Type 2 diabetes mellitus with proliferative diabetic retinopathy without macular edema: Secondary | ICD-10-CM | POA: Diagnosis not present

## 2014-01-08 DIAGNOSIS — I471 Supraventricular tachycardia: Secondary | ICD-10-CM | POA: Diagnosis not present

## 2014-01-08 DIAGNOSIS — R269 Unspecified abnormalities of gait and mobility: Secondary | ICD-10-CM | POA: Diagnosis not present

## 2014-01-08 DIAGNOSIS — E1129 Type 2 diabetes mellitus with other diabetic kidney complication: Secondary | ICD-10-CM | POA: Diagnosis not present

## 2014-01-08 DIAGNOSIS — H3320 Serous retinal detachment, unspecified eye: Secondary | ICD-10-CM | POA: Diagnosis not present

## 2014-01-08 DIAGNOSIS — M6281 Muscle weakness (generalized): Secondary | ICD-10-CM | POA: Diagnosis not present

## 2014-01-08 DIAGNOSIS — F4320 Adjustment disorder, unspecified: Secondary | ICD-10-CM | POA: Diagnosis not present

## 2014-01-08 DIAGNOSIS — R609 Edema, unspecified: Secondary | ICD-10-CM | POA: Diagnosis not present

## 2014-01-08 DIAGNOSIS — Z09 Encounter for follow-up examination after completed treatment for conditions other than malignant neoplasm: Secondary | ICD-10-CM | POA: Diagnosis not present

## 2014-01-08 DIAGNOSIS — N039 Chronic nephritic syndrome with unspecified morphologic changes: Secondary | ICD-10-CM | POA: Diagnosis not present

## 2014-01-08 DIAGNOSIS — I129 Hypertensive chronic kidney disease with stage 1 through stage 4 chronic kidney disease, or unspecified chronic kidney disease: Secondary | ICD-10-CM | POA: Diagnosis not present

## 2014-01-08 LAB — TYPE AND SCREEN
ABO/RH(D): A POS
Antibody Screen: POSITIVE
DAT, IgG: NEGATIVE
Donor AG Type: NEGATIVE
Donor AG Type: NEGATIVE
PT AG Type: NEGATIVE
Unit division: 0
Unit division: 0

## 2014-01-08 LAB — RENAL FUNCTION PANEL
Albumin: 1.6 g/dL — ABNORMAL LOW (ref 3.5–5.2)
BUN: 28 mg/dL — ABNORMAL HIGH (ref 6–23)
CO2: 27 mEq/L (ref 19–32)
Calcium: 7.9 mg/dL — ABNORMAL LOW (ref 8.4–10.5)
Chloride: 97 mEq/L (ref 96–112)
Creatinine, Ser: 2.89 mg/dL — ABNORMAL HIGH (ref 0.50–1.10)
GFR calc Af Amer: 20 mL/min — ABNORMAL LOW (ref >90)
GFR calc non Af Amer: 17 mL/min — ABNORMAL LOW (ref >90)
Glucose, Bld: 132 mg/dL — ABNORMAL HIGH (ref 70–99)
Phosphorus: 2.6 mg/dL (ref 2.3–4.6)
Potassium: 4 mEq/L (ref 3.7–5.3)
Sodium: 136 mEq/L — ABNORMAL LOW (ref 137–147)

## 2014-01-08 LAB — GLUCOSE, CAPILLARY
Glucose-Capillary: 138 mg/dL — ABNORMAL HIGH (ref 70–99)
Glucose-Capillary: 138 mg/dL — ABNORMAL HIGH (ref 70–99)

## 2014-01-08 LAB — CBC
HCT: 25.7 % — ABNORMAL LOW (ref 36.0–46.0)
Hemoglobin: 8.4 g/dL — ABNORMAL LOW (ref 12.0–15.0)
MCH: 30.1 pg (ref 26.0–34.0)
MCHC: 32.7 g/dL (ref 30.0–36.0)
MCV: 92.1 fL (ref 78.0–100.0)
Platelets: 311 10*3/uL (ref 150–400)
RBC: 2.79 MIL/uL — ABNORMAL LOW (ref 3.87–5.11)
RDW: 15.1 % (ref 11.5–15.5)
WBC: 9.5 10*3/uL (ref 4.0–10.5)

## 2014-01-08 MED ORDER — HYDRALAZINE HCL 100 MG PO TABS: 100.0000 mg | ORAL_TABLET | Freq: Three times a day (TID) | ORAL | Status: DC

## 2014-01-08 MED ORDER — DSS 100 MG PO CAPS: 100.0000 mg | ORAL_CAPSULE | Freq: Two times a day (BID) | ORAL | Status: DC

## 2014-01-08 MED ORDER — SIMVASTATIN 20 MG PO TABS: 20.0000 mg | ORAL_TABLET | Freq: Every day | ORAL | Status: DC

## 2014-01-08 MED ORDER — ASPIRIN 325 MG PO TABS: 325.0000 mg | ORAL_TABLET | Freq: Every day | ORAL | Status: DC

## 2014-01-08 MED ORDER — DARBEPOETIN ALFA-POLYSORBATE 100 MCG/0.5ML IJ SOLN: 100.0000 ug | INTRAMUSCULAR | Status: DC

## 2014-01-08 MED ORDER — NAPHAZOLINE-PHENIRAMINE 0.025-0.3 % OP SOLN: 1.0000 [drp] | Freq: Four times a day (QID) | OPHTHALMIC | Status: DC | PRN

## 2014-01-08 MED ORDER — BISACODYL 10 MG RE SUPP: 10.0000 mg | Freq: Every day | RECTAL | Status: DC | PRN

## 2014-01-08 MED ORDER — LACTULOSE 10 GM/15ML PO SOLN: 20.0000 g | Freq: Two times a day (BID) | ORAL | Status: DC | PRN

## 2014-01-08 MED ORDER — LEVALBUTEROL HCL 0.63 MG/3ML IN NEBU: 0.6300 mg | INHALATION_SOLUTION | RESPIRATORY_TRACT | Status: DC | PRN

## 2014-01-08 MED ORDER — RENA-VITE PO TABS: 1.0000 | ORAL_TABLET | Freq: Every day | ORAL | Status: DC

## 2014-01-08 MED ORDER — METOPROLOL TARTRATE 100 MG PO TABS: 100.0000 mg | ORAL_TABLET | Freq: Two times a day (BID) | ORAL | Status: DC

## 2014-01-08 MED ORDER — CLONIDINE HCL 0.1 MG PO TABS: 0.1000 mg | ORAL_TABLET | Freq: Three times a day (TID) | ORAL | Status: DC

## 2014-01-08 MED ORDER — ISOSORBIDE MONONITRATE ER 30 MG PO TB24: 30.0000 mg | ORAL_TABLET | Freq: Every day | ORAL | Status: DC

## 2014-01-08 MED ORDER — CALCIUM ACETATE 667 MG PO CAPS: 667.0000 mg | ORAL_CAPSULE | Freq: Three times a day (TID) | ORAL | Status: DC

## 2014-01-08 NOTE — Progress Notes (Signed)
[facility]  S: Seen without interpreter this morning, but communicated through simple / single signs and written sentences. Pt without pain, feels much better.  O:BP 154/68  Pulse 78  Temp(Src) 98.7 F (37.1 C) (Oral)  Resp 20  Ht 5\' 4"  (1.626 m)  Wt 209 lb 3.5 oz (94.9 kg)  BMI 35.89 kg/m2  SpO2 100%  Intake/Output Summary (Last 24 hours) at 01/08/14 0813 Last data filed at 01/08/14 [id number]  Gross per 24 hour  Intake   1130 ml  Output   3650 ml  Net  -2520 ml   UOP 650 1/13 0700 to 1/14 0700  Weight change: -9.5 oz (-0.27 kg)  Gen: adult female, sitting up in chair eating breakfast, NAD CVS: RRR, no murmur appreciated Resp: breath sounds clearer, normal WOB now on room air Abd:+ BS, soft NTND  Ext: markedly improved bilateral symmetric 1+ edema, AVF LUA + bruit  Vascular access: right tunneled catheter without bleeding / drainage  Left UE dressing in place, clean / dry / intact over fistula formation wound  NEURO: Deaf, no obvious new deficit; very poor eyesight at baseline   . aspirin  325 mg Oral Daily  . calcium acetate  667 mg Oral TID WC  . cloNIDine  0.1 mg Oral TID  . darbepoetin (ARANESP) injection - NON-DIALYSIS  100 mcg Subcutaneous Q Tue-1800  . docusate sodium  100 mg Oral BID  . guaiFENesin  1,200 mg Oral BID  . hydrALAZINE  100 mg Oral Q8H  . insulin aspart  0-5 Units Subcutaneous QHS  . insulin aspart  0-9 Units Subcutaneous TID WC  . isosorbide mononitrate  30 mg Oral Daily  . metoprolol tartrate  100 mg Oral BID  . milk and molasses  1 enema Rectal Once  . multivitamin  1 tablet Oral QHS  . polyethylene glycol  17 g Oral Daily  . simvastatin  20 mg Oral q1800  . sodium chloride  3 mL Intravenous Q12H   No results found. BMET    Component Value Date/Time   NA 136* 01/08/2014 0606   K 4.0 01/08/2014 0606   CL 97 01/08/2014 0606   CO2 27 01/08/2014 0606   GLUCOSE 132* 01/08/2014 0606   BUN 28* 01/08/2014 0606   CREATININE 2.89*  01/08/2014 0606   CREATININE 0.76 05/23/2011 0956   CALCIUM 7.9* 01/08/2014 0606   GFRNONAA 17* 01/08/2014 0606   GFRAA 20* 01/08/2014 0606   CBC    Component Value Date/Time   WBC 9.5 01/08/2014 0600   RBC 2.79* 01/08/2014 0600   HGB 8.4* 01/08/2014 0600   HCT 25.7* 01/08/2014 0600   PLT 311 01/08/2014 0600   MCV 92.1 01/08/2014 0600   MCH 30.1 01/08/2014 0600   MCHC 32.7 01/08/2014 0600   RDW 15.1 01/08/2014 0600   LYMPHSABS 3.4 12/25/2013 0553   MONOABS 0.5 12/25/2013 0553   EOSABS 0.1 12/25/2013 0553   BASOSABS 0.0 12/25/2013 0553     Assessment: 1. New ESRD sec DM/HTN, s/p right tunneled cath placed 1/9 and left brachiocephalic fistula formation 2. HTN / DM - management per primary team 3. Vol overload, improving greatly with HD 4. Anemia on aranesp, staretd 1/6 - acute drop to 6.8 1/13, transfused 2 units on HD 1/14, rise to 8.5 5. Sec HPTH  PTH 289, not yet on hectorol  Plan: 1. Probably for HD tomorrow. 2. Trend CBC, consider retransfusion if dips below 7 again 3. In process of being set up for  outpt dialysis 4. Pending SNF placement [facility]) 5. Otherwise per primary.  Please see also pending attending cosign for any edits / additions.  Emmaline Kluver, MD PGY-2, [place] [facility] 01/08/2014, 8:13 AM

## 2014-01-08 NOTE — Progress Notes (Signed)
Attempted to call RN at [facility] regarding patient transfer to facility x2 attempts. Left message with admission RN. Awaiting call back.Cindee Salt

## 2014-01-08 NOTE — Progress Notes (Signed)
I have seen and examined this patient and agree with the plan of care. Has appointment for dialysis at [facility]. SNF placement at [facility] . No aranesp as outpatient [facility] W 01/08/2014, 11:25 AM

## 2014-01-08 NOTE — Progress Notes (Addendum)
Clinical Social Worker spoke to patient, with RN and translator by bedside, about discharge. Patient is agreeable to plan and understands that she needs rehab. CSW explained that we can arrange transport via EMS. Patient in agreement. CSW also notified patient's son, Salome Salber and he is agreeable to the plan and has agreed to do paperwork at [place]. Patient's sister was also notified of dc plan. CSW will sign off, as social work intervention is no longer needed.  Jeanette Caprice, MSW, [place]

## 2014-01-08 NOTE — Progress Notes (Signed)
Clinical Social Work Department CLINICAL SOCIAL WORK PLACEMENT NOTE 01/08/2014  Patient:  [name], [name]  Account Number:  [id number] [place] date:  12/22/2013  Clinical Social Worker:  Megan Salon  Date/time:  12/29/2013 12:16 PM  Clinical Social Work is seeking post-discharge placement for this patient at the following level of care:   [place]   (*CSW will update this form in Epic as items are completed)   12/29/2013  Patient/family provided with [place] Department of Clinical Social Work's list of facilities offering this level of care within the geographic area requested by the patient (or if unable, by the patient's family).  12/29/2013  Patient/family informed of their freedom to choose among providers that offer the needed level of care, that participate in Medicare, Medicaid or managed care program needed by the patient, have an available bed and are willing to accept the patient.  12/29/2013  Patient/family informed of [facility]' ownership interest in [facility], as well as of the fact that they are under no obligation to receive care at this facility.  PASARR submitted to EDS on 12/29/2013 PASARR number received from EDS on 12/29/2013  FL2 transmitted to all facilities in geographic area requested by pt/family on  12/29/2013 FL2 transmitted to all facilities within larger geographic area on   Patient informed that his/her managed care company has contracts with or will negotiate with  certain facilities, including the following:     Patient/family informed of bed offers received:  01/05/2014 Patient chooses bed at [facility] Physician recommends and patient chooses bed at    Patient to be transferred to [facility] on  01/08/2014 Patient to be transferred to facility by EMS  The following physician request were entered in Epic:   Additional Comments:  Jeanette Caprice, MSW,  [place]

## 2014-01-08 NOTE — Discharge Summary (Signed)
Physician Discharge Summary  [name] [id number] DOB: 02-04-1956 DOA: 12/22/2013  PCP: Pcp Not In System  Admit date: 12/22/2013 Discharge date: 01/08/2014  Time spent: 40 minutes  Recommendations for Outpatient Follow-up:  1. Please follow up on patient's blood pressures, as they were elevated during this hospitalization 2. 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 Sign interpreter was used to help translate. Patient is a poor historian. She reports that she has been feeling weak since the summer. However, after Christmas she developed cough, sinus congestion and drainage. She initially went to [facility] health. She was found to be tachycardic with heart rate of 170s. She was given diltiazem without relief. She was given adenosine which broke her out of the rhythm to a sinus rhythm. Chest x-ray showed right perihilar airspace disease concerning for pneumonia. Hospitalist service  was asked to admit the patient for further care and management. Currently feels better, complaining of generalized weakness. Felt nauseated earlier today but has not vomited. Currently denies any chest pain. Denies any abdominal pain or diarrhea.  Hospital Course:  Patient is a pleasant 58 year old female with a past medical history of deafness, hypertension, type 2 diabetes mellitus, who was admitted to the medicine service on 12/22/2013, presenting with complaints of generalized weakness, congestion, shortness of breath. On presentation she was found to be in supraventricular tachycardia, having ventricular rates in the 170s. Initial chest x-ray showed right perihilar airspace disease concerning for pneumonia. She was started on empiric IV antibiotic therapy with ceftriaxone and azithromycin. Blood cultures were sent. Initial lab work also showed the development of acute kidney injury, with BNP showed a creatinine of 3.38 and BUN of 55. Looking back at medical records it she had a creatinine of 0.79 on 06/26/2012. She was started on IV fluids. A renal ultrasound performed on 11/21/202014 did not reveal evidence of obstructive uropathy. There was increased bilateral renal cortical echogenicity secondary to medical renal disease. Despite IV fluids her kidney function failed to improve. She was seen and evaluated by nephrology on 12/25/2013. With regard to SVT cardiology was consulted as she was managed with beta blockers. A transthoracic echocardiogram performed on 11/21/202014 showed an ejection fraction of 65-70% with grade 1 diastolic dysfunction. There is also a prominent subendocardial "stripe" which could suggest an infiltrative process such as amyloidosis. Cardiology recommended pursuing further workup for possible amyloid as an outpatient. Nephrology did not feel that current renal failure reflected amyloid kidney. SPEP was unremarkable. On 12/31/2048 she underwent vein mapping as nephrology recommended  placement of permanent venous access for hemodialysis. Patient significantly fluid overloaded despite high doses of diuretic therapy. Dr. Donnetta Hutching of vascular surgery placed dialysis catheter on 01/02/2014. Patient tolerated procedure well there were no immediate complications. She was started on hemodialysis Mondays Wednesdays and Fridays. Social work was consulted to set patient up with outpatient dialysis as well as set her up with skilled nursing facility placement. She showed clinical improvement over the last 4-5 days, undergoing hemodialysis. Respiratory status remaining stable and she completed 8 days of Levaquin. She has not had further episodes of SVT. Given clinical stability she was discharged in stable condition to a skilled nursing facility on 01/08/2014 per  Procedures:  Status post dialysis catheter placement on 01/02/2014  Hemodialysis Mondays Wednesdays and Fridays  Transthoracic echocardiogram performed on 08/13/202014 impression: Ejection fraction [id number], grade 1 diastolic dysfunction, prominent subendocardial "stripe" could suggest an infiltrative process such as amyloidosis.  Consultations:  Cardiology  Nephrology  Vascular surgery  Psychiatry  Social work  Discharge Exam: Filed Vitals:   01/08/14 1037  BP: 157/66  Pulse: 86  Temp:   Resp:     General: Patient is in no acute distress, awake alert, states feeling okay through sign language. Cardiovascular: Regular rate rhythm normal S1-S2 Respiratory: Lungs are clear to auscultation bilaterally  Discharge Instructions  Discharge Orders   Future Appointments Provider Department Dept Phone   02/10/2014 1:00 PM [facility] [phone]   02/10/2014 2:00 PM Rosetta Posner, MD [facility] [place] [phone]   Future Orders Complete By Expires   Call MD for:  difficulty breathing, headache or visual disturbances  As directed    Call MD for:  extreme  fatigue  As directed    Call MD for:  persistant dizziness or light-headedness  As directed    Call MD for:  persistant nausea and vomiting  As directed    Call MD for:  temperature >100.4  As directed    Diet - low sodium heart healthy  As directed    Increase activity slowly  As directed        Medication List         aspirin 325 MG tablet  Take 1 tablet (325 mg total) by mouth daily.     bisacodyl 10 MG suppository  Commonly known as:  DULCOLAX  Place 1 suppository (10 mg total) rectally daily as needed for mild constipation, moderate constipation or severe constipation.     calcium acetate 667 MG capsule  Commonly known as:  PHOSLO  Take 1 capsule (667 mg total) by mouth 3 (three) times daily with meals.     cloNIDine 0.1 MG tablet  Commonly known as:  CATAPRES  Take 1 tablet (0.1 mg total) by mouth 3 (three) times daily.     darbepoetin 100 MCG/0.5ML Soln injection  Commonly known as:  ARANESP  Inject 0.5 mLs (100 mcg total) into the skin every Tuesday at 6 PM.     DSS 100 MG Caps  Take 100 mg by mouth 2 (two) times daily.     hydrALAZINE 100 MG tablet  Commonly known as:  APRESOLINE  Take 1 tablet (100 mg total) by mouth every 8 (eight) hours.     isosorbide mononitrate 30 MG 24 hr tablet  Commonly known as:  IMDUR  Take 1 tablet (30 mg total) by mouth daily.     lactulose 10 GM/15ML solution  Commonly known as:  CHRONULAC  Take 30  mLs (20 g total) by mouth 2 (two) times daily as needed for moderate constipation.     levalbuterol 0.63 MG/3ML nebulizer solution  Commonly known as:  XOPENEX  Take 3 mLs (0.63 mg total) by nebulization every 2 (two) hours as needed for wheezing or shortness of breath.     metoprolol 100 MG tablet  Commonly known as:  LOPRESSOR  Take 1 tablet (100 mg total) by mouth 2 (two) times daily.     multivitamin Tabs tablet  Take 1 tablet by mouth at bedtime.     naphazoline-pheniramine 0.025-0.3 % ophthalmic solution  Commonly known  as:  NAPHCON-A  Place 1 drop into both eyes 4 (four) times daily as needed for irritation.     simvastatin 20 MG tablet  Commonly known as:  ZOCOR  Take 1 tablet (20 mg total) by mouth daily at 6 PM.       No Known Allergies     Follow-up Information   Follow up with EARLY, TODD, MD In 6 weeks.   Specialty:  Vascular Surgery   Contact information:   [place] [address] [phone]       Follow up with Sherril Croon, MD In 1 week.   Specialty:  Nephrology   Contact information:   [place] [address] [phone]       Follow up with Pcp Not In System.       The results of significant diagnostics from this hospitalization (including imaging, microbiology, ancillary and laboratory) are listed below for reference.    Significant Diagnostic Studies: Dg Chest 2 View  12/22/2013   CLINICAL DATA:  Irregular heartbeat, elevated blood pressure  EXAM: CHEST  2 VIEW  COMPARISON:  02/02/2007  FINDINGS: There is right perihilar airspace disease concerning for pneumonia. There is no other focal parenchymal opacity, pleural effusion, or pneumothorax. The heart and mediastinal contours are unremarkable.  The osseous structures are unremarkable.  IMPRESSION: Right perihilar airspace disease concerning for pneumonia. Recommend followup radiography in 4-6 weeks, to document complete resolution following adequate medical therapy. If there is not complete resolution, then recommend further evaluation with CT of the chest to exclude underlying pathology.   Electronically Signed   By: Kathreen Devoid   On: 12/22/2013 18:25   US Renal  12/25/2013   ADDENDUM REPORT: 12/25/2013 16:29  ADDENDUM: The right kidney length in the findings section is incorrectly given as 5.2 cm. The correct length of the right kidney is 11.2 cm.   Electronically Signed   By: Earle Gell M.D.   On: 12/25/2013 16:29   12/25/2013   CLINICAL DATA:  Acute renal failure  EXAM: RENAL/URINARY TRACT ULTRASOUND  COMPLETE  COMPARISON:  None.  FINDINGS: Right Kidney:  Length: 5.2 cm. There is increased renal cortical echogenicity. There is a 1.6 x 1.8 x 2 cm anechoic right renal mass most consistent with a cyst.  Left Kidney:  Length: 10.9 cm. There is increased renal cortical echogenicity. No mass or hydronephrosis visualized.  Bladder:  Appears normal for degree of bladder distention.  Incidental note are made of splenic punctate hyperechoic foci likely reflecting calcifications from prior granulomatous disease. There is a small amount of perisplenic fluid.  IMPRESSION: 1. No obstructive uropathy.  2. Increased bilateral renal cortical echogenicity as can be seen with medical renal disease.  3.  Small amount of nonspecific perisplenic fluid.  Electronically Signed: By: Kathreen Devoid On: 03-May-202014 12:30   Dg Chest Port 1 [address]  01/02/2014   CLINICAL DATA:  Status post dialysis catheter placement  EXAM: PORTABLE CHEST - 1 VIEW  COMPARISON:  12/26/2013  FINDINGS: Cardiac shadow is stable. The lungs are well aerated but demonstrates some right middle lobe infiltrate which was not seen on the prior exam. No pneumothorax is noted. No bony abnormality is seen. The dialysis catheter is in satisfactory position.  IMPRESSION: Status post dialysis catheter placement in satisfactory position.  New right middle lobe infiltrate.   Electronically Signed   By: Inez Catalina M.D.   On: 01/02/2014 14:27   Dg Chest Port 1 View  12/26/2013   CLINICAL DATA:  Cough, shortness of breath  EXAM: PORTABLE CHEST - 1 VIEW  COMPARISON:  12/22/2013  FINDINGS: Low lung volumes. Cardiac silhouette is enlarged. There is decreased conspicuity of the perihilar densities. No new focal regions of consolidation or new focal infiltrates. The osseous structures unremarkable.  IMPRESSION: Decreased conspicuity of the perihilar opacities likely reflecting pulmonary edema. No new focal regions of consolidation or new focal infiltrates. Note a component of the  decreased conspicuity on the lung findings likely secondary to increased lung volumes when compared to previous study.   Electronically Signed   By: Margaree Mackintosh M.D.   On: 12/26/2013 09:35   Dg Fluoro Guide Cv Line-no Report  01/02/2014   CLINICAL DATA: Diatek Placement   FLOURO GUIDE CV LINE  Fluoroscopy was utilized by the requesting physician.  No radiographic  interpretation.     Microbiology: No results found for this or any previous visit (from the past 240 hour(s)).   Labs: Basic Metabolic Panel:  Recent Labs Lab 01/03/14 0312 01/04/14 0423 01/05/14 1411 01/07/14 0520 01/08/14 0606  NA 136* 136* 134* 131* 136*  K 3.9 3.6* 3.9 3.6* 4.0  CL 99 97 97 94* 97  CO2 22 26 25 25 27   GLUCOSE 118* 123* 173* 151* 132*  BUN 76* 55* 60* 50* 28*  CREATININE 4.99* 4.23* 4.62* 4.07* 2.89*  CALCIUM 7.7* 7.7* 7.8* 7.9* 7.9*  PHOS 7.3* 4.8* 5.0* 4.2 2.6   Liver Function Tests:  Recent Labs Lab 01/03/14 0312 01/04/14 0423 01/05/14 1411 01/07/14 0520 01/08/14 0606  ALBUMIN 1.4* 1.7* 1.7* 1.6* 1.6*   No results found for this basename: LIPASE, AMYLASE,  in the last 168 hours No results found for this basename: AMMONIA,  in the last 168 hours CBC:  Recent Labs Lab 01/05/14 1410 01/06/14 0438 01/06/14 0948 01/07/14 0829 01/08/14 0600  WBC 8.0 10.2 10.6* 9.5 9.5  HGB 9.7* 6.8* 6.9* 6.9* 8.4*  HCT 29.3* 20.6* 20.9* 20.8* 25.7*  MCV 90.7 92.0 92.1 92.0 92.1  PLT 347 386 401* 370 311   Cardiac Enzymes: No results found for this basename: CKTOTAL, CKMB, CKMBINDEX, TROPONINI,  in the last 168 hours BNP: BNP (last 3 results) No results found for this basename: PROBNP,  in the last 8760 hours CBG:  Recent Labs Lab 01/07/14 0610 01/07/14 1320 01/07/14 1637 01/07/14 2152 01/08/14 0557  GLUCAP 144* 98 183* 110* 138*       Signed:  Chayse Gracey  [facility] 01/08/2014, 11:08 AM

## 2014-01-09 DIAGNOSIS — D631 Anemia in chronic kidney disease: Secondary | ICD-10-CM | POA: Diagnosis not present

## 2014-01-09 DIAGNOSIS — N039 Chronic nephritic syndrome with unspecified morphologic changes: Secondary | ICD-10-CM | POA: Diagnosis not present

## 2014-01-09 DIAGNOSIS — N186 End stage renal disease: Secondary | ICD-10-CM | POA: Diagnosis not present

## 2014-01-09 DIAGNOSIS — N2581 Secondary hyperparathyroidism of renal origin: Secondary | ICD-10-CM | POA: Diagnosis not present

## 2014-01-12 ENCOUNTER — Other Ambulatory Visit: Payer: Self-pay | Admitting: Vascular Surgery

## 2014-01-12 ENCOUNTER — Encounter: Payer: Self-pay | Admitting: Internal Medicine

## 2014-01-12 ENCOUNTER — Non-Acute Institutional Stay (SKILLED_NURSING_FACILITY): Payer: Medicare Other | Admitting: Internal Medicine

## 2014-01-12 DIAGNOSIS — I5032 Chronic diastolic (congestive) heart failure: Secondary | ICD-10-CM | POA: Diagnosis not present

## 2014-01-12 DIAGNOSIS — J9600 Acute respiratory failure, unspecified whether with hypoxia or hypercapnia: Secondary | ICD-10-CM

## 2014-01-12 DIAGNOSIS — E1122 Type 2 diabetes mellitus with diabetic chronic kidney disease: Secondary | ICD-10-CM

## 2014-01-12 DIAGNOSIS — N186 End stage renal disease: Secondary | ICD-10-CM

## 2014-01-12 DIAGNOSIS — Z4931 Encounter for adequacy testing for hemodialysis: Secondary | ICD-10-CM

## 2014-01-12 DIAGNOSIS — E1129 Type 2 diabetes mellitus with other diabetic kidney complication: Secondary | ICD-10-CM

## 2014-01-12 DIAGNOSIS — E785 Hyperlipidemia, unspecified: Secondary | ICD-10-CM

## 2014-01-12 DIAGNOSIS — F489 Nonpsychotic mental disorder, unspecified: Secondary | ICD-10-CM

## 2014-01-12 DIAGNOSIS — H913 Deaf nonspeaking, not elsewhere classified: Secondary | ICD-10-CM

## 2014-01-12 DIAGNOSIS — I10 Essential (primary) hypertension: Secondary | ICD-10-CM

## 2014-01-12 DIAGNOSIS — I498 Other specified cardiac arrhythmias: Secondary | ICD-10-CM | POA: Diagnosis not present

## 2014-01-12 DIAGNOSIS — N2581 Secondary hyperparathyroidism of renal origin: Secondary | ICD-10-CM | POA: Diagnosis not present

## 2014-01-12 DIAGNOSIS — J9601 Acute respiratory failure with hypoxia: Secondary | ICD-10-CM

## 2014-01-12 DIAGNOSIS — D631 Anemia in chronic kidney disease: Secondary | ICD-10-CM | POA: Diagnosis not present

## 2014-01-12 DIAGNOSIS — N179 Acute kidney failure, unspecified: Secondary | ICD-10-CM | POA: Diagnosis not present

## 2014-01-12 DIAGNOSIS — I471 Supraventricular tachycardia: Secondary | ICD-10-CM

## 2014-01-12 DIAGNOSIS — I509 Heart failure, unspecified: Secondary | ICD-10-CM

## 2014-01-12 DIAGNOSIS — J189 Pneumonia, unspecified organism: Secondary | ICD-10-CM

## 2014-01-12 DIAGNOSIS — N184 Chronic kidney disease, stage 4 (severe): Secondary | ICD-10-CM

## 2014-01-12 NOTE — Assessment & Plan Note (Signed)
Felt secondary to PNA and SVT and CHF; improved with IV abx, ttx of SVT and dialysis for fluid overload from CHF and ARF

## 2014-01-12 NOTE — Assessment & Plan Note (Signed)
New ECHO with grade 1 diastolic dysfunction and a "stripe" c/w possible amyloid; improved with dialysis

## 2014-01-12 NOTE — Assessment & Plan Note (Signed)
On Zocor 20 mg - last FLP 4 yrs ago LDL was 170, none since;will order for baseline

## 2014-01-12 NOTE — Assessment & Plan Note (Signed)
Apparently hard to control;on hydralazine, clonidine, metoprolol and imdur

## 2014-01-12 NOTE — Assessment & Plan Note (Signed)
HbA1c 5.9 on no meds; hearthealthy and renbal diet

## 2014-01-12 NOTE — Assessment & Plan Note (Signed)
IUnspecified ; on no meds

## 2014-01-12 NOTE — Assessment & Plan Note (Signed)
I week ago BUN 77/Cr 4.67  GFR 11; Renal U/S showed cortical echo genicity sec to medical renal dx; pt was not able to be diuresed by high dose diuretics so renal decided needed dialysis and pt was able to fluid unload with that; access placed 1/9; 4 days ago BUN 28.Cr2.89  GFR 20

## 2014-01-12 NOTE — Assessment & Plan Note (Addendum)
Treated with rocephin and Zithromax with resolution; will use nebs as needed

## 2014-01-12 NOTE — Progress Notes (Signed)
MRN: [id number] Name: ENEZ HARJO  Sex: female Age: 58 y.o. DOB: 01/05/1956  [place] #: Helene Kelp Facility/Room: 119 Level Of Care: SNF Provider: Inocencio Homes D Emergency Contacts: Extended Emergency Contact Information Primary Emergency Contact: Sproles,Benita  [place] Mobile Phone: [phone] Relation: Daughter  Code Status: FULL  Allergies: Review of patient's allergies indicates no known allergies.  Chief Complaint  Patient presents with  . nursing home admission    HPI: Patient is 58 y.o. female who is admitted after respiratory failure sec to PNA and SVT with CHF and fluid overload and new ARF, now on dialysis M,W,Fri. Pt is deaf and doesn't speak.  Past Medical History  Diagnosis Date  . Hyperlipidemia   . Hypertension     a. Has previously refused blood pressure meds.   . Deaf     Divorced from husband but lives with him. Has daughter but she does not care for her.  . Bilateral leg edema     a. Chronic.  Marland Kitchen Psychiatric disorder     She frequently exhibits paranoia and has been diagnosed with psychotic d/o NOS during hospital stay in the past. She is tangetial and perseverative during her visits. She apparently has had a bad experience with mental health in [place] in the past and refuses to discuss mental issues for fear that she will be sent back there. Her paranoia, communication issues, financial woes and lack of fam  . Fibroid uterus   . Anemia     Due to fibroids.  . Heart murmur   . Diabetes mellitus     a. Per PCP note 2012 (A1C 9.2) - pt unwilling to take meds and was educated on risk of uncontrolled DM.  b. A1C 5.9 in 11/2013.    Past Surgical History  Procedure Laterality Date  . No past surgeries    . Insertion of dialysis catheter Right 01/02/2014    Procedure: INSERTION OF DIALYSIS CATHETER;  Surgeon: Rosetta Posner, MD;  Location: [facility];  Service: Vascular;  Laterality: Right;  . Av fistula placement Left 01/02/2014    Procedure:  ARTERIOVENOUS (AV) FISTULA CREATION;  Surgeon: Rosetta Posner, MD;  Location: [facility];  Service: Vascular;  Laterality: Left;      Medication List       This list is accurate as of: 01/12/14 11:05 AM.  Always use your most recent med list.               aspirin 325 MG tablet  Take 1 tablet (325 mg total) by mouth daily.     bisacodyl 10 MG suppository  Commonly known as:  DULCOLAX  Place 1 suppository (10 mg total) rectally daily as needed for mild constipation, moderate constipation or severe constipation.     calcium acetate 667 MG capsule  Commonly known as:  PHOSLO  Take 1 capsule (667 mg total) by mouth 3 (three) times daily with meals.     cloNIDine 0.1 MG tablet  Commonly known as:  CATAPRES  Take 1 tablet (0.1 mg total) by mouth 3 (three) times daily.     darbepoetin 100 MCG/0.5ML Soln injection  Commonly known as:  ARANESP  Inject 0.5 mLs (100 mcg total) into the skin every Tuesday at 6 PM.     DSS 100 MG Caps  Take 100 mg by mouth 2 (two) times daily.     hydrALAZINE 100 MG tablet  Commonly known as:  APRESOLINE  Take 1 tablet (100 mg total) by  mouth every 8 (eight) hours.     isosorbide mononitrate 30 MG 24 hr tablet  Commonly known as:  IMDUR  Take 1 tablet (30 mg total) by mouth daily.     lactulose 10 GM/15ML solution  Commonly known as:  CHRONULAC  Take 30 mLs (20 g total) by mouth 2 (two) times daily as needed for moderate constipation.     levalbuterol 0.63 MG/3ML nebulizer solution  Commonly known as:  XOPENEX  Take 3 mLs (0.63 mg total) by nebulization every 2 (two) hours as needed for wheezing or shortness of breath.     metoprolol 100 MG tablet  Commonly known as:  LOPRESSOR  Take 1 tablet (100 mg total) by mouth 2 (two) times daily.     multivitamin Tabs tablet  Take 1 tablet by mouth at bedtime.     naphazoline-pheniramine 0.025-0.3 % ophthalmic solution  Commonly known as:  NAPHCON-A  Place 1 drop into both eyes 4 (four) times daily as  needed for irritation.     simvastatin 20 MG tablet  Commonly known as:  ZOCOR  Take 1 tablet (20 mg total) by mouth daily at 6 PM.        No orders of the defined types were placed in this encounter.    Immunization History  Administered Date(s) Administered  . Influenza,inj,Quad PF,36+ Mos 01/03/2014  . Pneumococcal Polysaccharide-23 01/03/2014    History  Substance Use Topics  . Smoking status: Never Smoker   . Smokeless tobacco: Not on file     Comment: Prior dip  . Alcohol Use: No    Family history is noncontributory    Review of Systems pt is deaf/mute-pt did not write for me-I wrote things to her, but through pantomime she relayed R arm pain, possibly a headache. She has not been to dialysis yet today; nurses relay no concerns  Filed Vitals:   01/12/14 1019  BP: 126/59  Pulse: 64  Temp: 98 F (36.7 C)  Resp: 18    Physical Exam  GENERAL APPEARANCE: Alert, nonconversant. Appropriately groomed. No acute distress.  SKIN: No diaphoresis rash, or wounds HEAD: Normocephalic, atraumatic  EYES: Conjunctiva/lids clear. Pupils round, reactive. EOMs intact.  EARS: External exam WNL, canals clear. Hearing grossly normal.  NOSE: No deformity or discharge.  MOUTH/THROAT: Lips w/o lesions. RESPIRATORY: Breathing is even, unlabored. Lung sounds are clear anteriorly, rales bibasilar   O2 sat at bedside per ADAMD was 98% with a pulse 72; good thrill  L AC CARDIOVASCULAR: Heart RRR no murmurs, rubs or gallops. 2+ peripheral edema.   GASTROINTESTINAL: Abdomen is soft, non-tender, not distended w/ normal bowel sounds. GENITOURINARY: Bladder non tender, not distended  MUSCULOSKELETAL: No abnormal joints or musculature NEUROLOGIC:  Cranial nerves 2-12 grossly intact. Moves all extremities no tremor. PSYCHIATRIC: Mood and affect appropriate to situation, no behavioral issues  Patient Active Problem List   Diagnosis Date Noted  . Acute renal failure 12/24/2013  . SVT  (supraventricular tachycardia) 12/24/2013  . NSVT (nonsustained ventricular tachycardia) 12/24/2013  . Abnormal echocardiogram 12/24/2013  . Chronic diastolic CHF (congestive heart failure) 12/24/2013  . Anemia 12/24/2013  . Constipation 12/24/2013  . 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 Acute respiratory failure with hypoxia 12/22/2013  . Community acquired pneumonia 12/22/2013  . HYPERLIPIDEMIA 02/28/2010  . LEG EDEMA 01/19/2010  . DEAF MUTISM 12/03/2009  . FIBROIDS, UTERUS 08/09/2009  . PSYCHIATRIC DISORDER 08/09/2009  . HYPERTENSION, ESSENTIAL, UNCONTROLLED 08/09/2009  . POSTMENOPAUSAL STATUS 08/09/2009  CBC    Component Value Date/Time   WBC 9.5 01/08/2014 0600   RBC 2.79* 01/08/2014 0600   HGB 8.4* 01/08/2014 0600   HCT 25.7* 01/08/2014 0600   PLT 311 01/08/2014 0600   MCV 92.1 01/08/2014 0600   LYMPHSABS 3.4 12/25/2013 0553   MONOABS 0.5 12/25/2013 0553   EOSABS 0.1 12/25/2013 0553   BASOSABS 0.0 12/25/2013 0553    CMP     Component Value Date/Time   NA 136* 01/08/2014 0606   K 4.0 01/08/2014 0606   CL 97 01/08/2014 0606   CO2 27 01/08/2014 0606   GLUCOSE 132* 01/08/2014 0606   BUN 28* 01/08/2014 0606   CREATININE 2.89* 01/08/2014 0606   CREATININE 0.76 05/23/2011 0956   CALCIUM 7.9* 01/08/2014 0606   PROT 5.8* 12/24/2013 1315   ALBUMIN 1.6* 01/08/2014 0606   AST 25 12/24/2013 1315   ALT 22 12/24/2013 1315   ALKPHOS 99 12/24/2013 1315   BILITOT <0.2* 12/24/2013 1315   GFRNONAA 17* 01/08/2014 0606   GFRAA 20* 01/08/2014 0606    Assessment and Plan  Acute respiratory failure with hypoxia Felt secondary to PNA and SVT and CHF; improved with IV abx, ttx of SVT and dialysis for fluid overload from CHF and ARF  SVT (supraventricular tachycardia) Placed on metoprolol per cards with control  Acute renal failure I week ago BUN 77/Cr 4.67  GFR 11; Renal U/S showed cortical echo genicity sec to medical renal dx; pt was not able to be diuresed by high dose diuretics so renal  decided needed dialysis and pt was able to fluid unload with that; access placed 1/9; 4 days ago BUN 28.Cr2.89  GFR 20  Chronic diastolic CHF (congestive heart failure) New ECHO with grade 1 diastolic dysfunction and a "stripe" c/w possible amyloid; improved with dialysis  Community acquired pneumonia Treated with rocephin and Zithromax with resolution; will use nebs as needed  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

## 2014-01-12 NOTE — Assessment & Plan Note (Signed)
Placed on metoprolol per cards with control

## 2014-01-14 DIAGNOSIS — N2581 Secondary hyperparathyroidism of renal origin: Secondary | ICD-10-CM | POA: Diagnosis not present

## 2014-01-14 DIAGNOSIS — N186 End stage renal disease: Secondary | ICD-10-CM | POA: Diagnosis not present

## 2014-01-14 DIAGNOSIS — D631 Anemia in chronic kidney disease: Secondary | ICD-10-CM | POA: Diagnosis not present

## 2014-01-14 DIAGNOSIS — N039 Chronic nephritic syndrome with unspecified morphologic changes: Secondary | ICD-10-CM | POA: Diagnosis not present

## 2014-01-16 DIAGNOSIS — N039 Chronic nephritic syndrome with unspecified morphologic changes: Secondary | ICD-10-CM | POA: Diagnosis not present

## 2014-01-16 DIAGNOSIS — D631 Anemia in chronic kidney disease: Secondary | ICD-10-CM | POA: Diagnosis not present

## 2014-01-16 DIAGNOSIS — N2581 Secondary hyperparathyroidism of renal origin: Secondary | ICD-10-CM | POA: Diagnosis not present

## 2014-01-16 DIAGNOSIS — N186 End stage renal disease: Secondary | ICD-10-CM | POA: Diagnosis not present

## 2014-01-19 DIAGNOSIS — N186 End stage renal disease: Secondary | ICD-10-CM | POA: Diagnosis not present

## 2014-01-19 DIAGNOSIS — D631 Anemia in chronic kidney disease: Secondary | ICD-10-CM | POA: Diagnosis not present

## 2014-01-19 DIAGNOSIS — N2581 Secondary hyperparathyroidism of renal origin: Secondary | ICD-10-CM | POA: Diagnosis not present

## 2014-01-21 DIAGNOSIS — N2581 Secondary hyperparathyroidism of renal origin: Secondary | ICD-10-CM | POA: Diagnosis not present

## 2014-01-21 DIAGNOSIS — D631 Anemia in chronic kidney disease: Secondary | ICD-10-CM | POA: Diagnosis not present

## 2014-01-21 DIAGNOSIS — N186 End stage renal disease: Secondary | ICD-10-CM | POA: Diagnosis not present

## 2014-01-23 DIAGNOSIS — D631 Anemia in chronic kidney disease: Secondary | ICD-10-CM | POA: Diagnosis not present

## 2014-01-23 DIAGNOSIS — N186 End stage renal disease: Secondary | ICD-10-CM | POA: Diagnosis not present

## 2014-01-23 DIAGNOSIS — N2581 Secondary hyperparathyroidism of renal origin: Secondary | ICD-10-CM | POA: Diagnosis not present

## 2014-01-24 DIAGNOSIS — N186 End stage renal disease: Secondary | ICD-10-CM | POA: Diagnosis not present

## 2014-02-10 ENCOUNTER — Encounter: Payer: Self-pay | Admitting: Vascular Surgery

## 2014-02-10 ENCOUNTER — Ambulatory Visit (INDEPENDENT_AMBULATORY_CARE_PROVIDER_SITE_OTHER): Payer: Self-pay | Admitting: Vascular Surgery

## 2014-02-10 ENCOUNTER — Ambulatory Visit (HOSPITAL_COMMUNITY)
Admit: 2014-02-10 | Discharge: 2014-02-10 | Disposition: A | Discharge status: Home / Self Care | Payer: Medicare Other | Attending: Vascular Surgery | Admitting: Vascular Surgery

## 2014-02-10 VITALS — BP 188/83 | HR 73 | Resp 16 | Ht 64.0 in | Wt 209.0 lb

## 2014-02-10 DIAGNOSIS — N186 End stage renal disease: Secondary | ICD-10-CM | POA: Insufficient documentation

## 2014-02-10 DIAGNOSIS — Z4931 Encounter for adequacy testing for hemodialysis: Secondary | ICD-10-CM

## 2014-02-10 DIAGNOSIS — Z09 Encounter for follow-up examination after completed treatment for conditions other than malignant neoplasm: Secondary | ICD-10-CM | POA: Insufficient documentation

## 2014-02-10 DIAGNOSIS — Z992 Dependence on renal dialysis: Secondary | ICD-10-CM

## 2014-02-10 DIAGNOSIS — Z9889 Other specified postprocedural states: Secondary | ICD-10-CM | POA: Insufficient documentation

## 2014-02-10 NOTE — Progress Notes (Signed)
The patient presents today for followup of her right IJ hemodialysis catheter and left upper arm brachiocephalic AV fistula creation by myself 01/02/2014. 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 She is having good use of her hemodialysis catheter. She misses. the ability to have a shower but is doing okay with this. She is in a nursing facility  On physical exam she has a very nice healing of her antecubital incision and excellent thrill and size maturation and her upper arm AV fistula. She underwent venous duplex of her fistula. This shows very good diameter ranging from 0.6-0.75 cm throughout its course.  Impression and plan a good Blaize Nipper maturation of her left upper arm AV fistula. I would wait a total of 3 months from her surgery on 01/03/2012 and this would begin using the fistula. I explained this via interpreter. She will see [place] on an as-needed basis

## 2014-02-20 ENCOUNTER — Non-Acute Institutional Stay (SKILLED_NURSING_FACILITY): Payer: Medicare Other | Admitting: Nurse Practitioner

## 2014-02-20 DIAGNOSIS — I509 Heart failure, unspecified: Secondary | ICD-10-CM

## 2014-02-20 DIAGNOSIS — E785 Hyperlipidemia, unspecified: Secondary | ICD-10-CM

## 2014-02-20 DIAGNOSIS — I5032 Chronic diastolic (congestive) heart failure: Secondary | ICD-10-CM | POA: Diagnosis not present

## 2014-02-20 DIAGNOSIS — I10 Essential (primary) hypertension: Secondary | ICD-10-CM

## 2014-02-20 DIAGNOSIS — E1129 Type 2 diabetes mellitus with other diabetic kidney complication: Secondary | ICD-10-CM

## 2014-02-20 DIAGNOSIS — E1122 Type 2 diabetes mellitus with diabetic chronic kidney disease: Secondary | ICD-10-CM

## 2014-02-20 DIAGNOSIS — J189 Pneumonia, unspecified organism: Secondary | ICD-10-CM

## 2014-02-20 DIAGNOSIS — F489 Nonpsychotic mental disorder, unspecified: Secondary | ICD-10-CM | POA: Diagnosis not present

## 2014-02-20 DIAGNOSIS — N184 Chronic kidney disease, stage 4 (severe): Secondary | ICD-10-CM

## 2014-02-20 NOTE — Progress Notes (Signed)
Patient ID: [name], female   DOB: 1956/09/13, 58 y.o.   MRN: [id number]    Nursing Home Location:  [place] of Service: SNF (31)  PCP: Pcp Not In System  No Known Allergies  Chief Complaint  Patient presents with  . Medical Managment of Chronic Issues    HPI:  Patient is 58 y.o. female who is at [facility] for rehab after deconditioning secondaryto PNA and SVT with CHF and fluid overload and new ARF, now on dialysis M,W,Fri. Pt is deaf; pt with history of psych disorder NOS but per epic does not disclose information regarding feeling, pt has not exhibited any behaviors. Review of Systems:  Pt deaf therefore a full ros is not able to obtain; no interpreter available, discussed with pt nurse; no concerns noted  Past Medical History  Diagnosis Date  . Hyperlipidemia   . Hypertension     a. Has previously refused blood pressure meds.   . Deaf     Divorced from husband but lives with him. Has daughter but she does not care for her.  . Bilateral leg edema     a. Chronic.  Marland Kitchen Psychiatric disorder     She frequently exhibits paranoia and has been diagnosed with psychotic d/o NOS during hospital stay in the past. She is tangetial and perseverative during her visits. She apparently has had a bad experience with mental health in [place] in the past and refuses to discuss mental issues for fear that she will be sent back there. Her paranoia, communication issues, financial woes and lack of fam  . Fibroid uterus   . Anemia     Due to fibroids.  . Heart murmur   . Diabetes mellitus     a. Per PCP note 2012 (A1C 9.2) - pt unwilling to take meds and was educated on risk of uncontrolled DM.  b. A1C 5.9 in 11/2013.   Past Surgical History  Procedure Laterality Date  . No past surgeries    . Insertion of dialysis catheter Right 01/02/2014    Procedure: INSERTION OF DIALYSIS CATHETER;  Surgeon: Rosetta Posner, MD;  Location: [place];  Service: Vascular;  Laterality: Right;    . Av fistula placement Left 01/02/2014    Procedure: ARTERIOVENOUS (AV) FISTULA CREATION;  Surgeon: Rosetta Posner, MD;  Location: [place];  Service: Vascular;  Laterality: Left;   Social History:   reports that she has never smoked. She does not have any smokeless tobacco history on file. She reports that she does not drink alcohol or use illicit drugs.  Family History  Problem Relation Age of Onset  . Other Father     Drowned from fishing?  . Heart disease      Medications: Patient's Medications  New Prescriptions   No medications on file  Previous Medications   ASPIRIN 325 MG TABLET    Take 1 tablet (325 mg total) by mouth daily.   BISACODYL (DULCOLAX) 10 MG SUPPOSITORY    Place 1 suppository (10 mg total) rectally daily as needed for mild constipation, moderate constipation or severe constipation.   CALCIUM ACETATE (PHOSLO) 667 MG CAPSULE    Take 1 capsule (667 mg total) by mouth 3 (three) times daily with meals.   CLONIDINE (CATAPRES) 0.1 MG TABLET    Take 1 tablet (0.1 mg total) by mouth 3 (three) times daily.   DARBEPOETIN (ARANESP) 100 MCG/0.5ML SOLN INJECTION    Inject 0.5 mLs (100 mcg total)  into the skin every Tuesday at 6 PM.   DIPHENHYDRAMINE (BENADRYL) 25 MG CAPSULE    Take 25 mg by mouth every 6 (six) hours as needed.   DOCUSATE SODIUM 100 MG CAPS    Take 100 mg by mouth 2 (two) times daily.   HYDRALAZINE (APRESOLINE) 100 MG TABLET    Take 1 tablet (100 mg total) by mouth every 8 (eight) hours.   ISOSORBIDE MONONITRATE (IMDUR) 30 MG 24 HR TABLET    Take 1 tablet (30 mg total) by mouth daily.   LACTULOSE (CHRONULAC) 10 GM/15ML SOLUTION    Take 30 mLs (20 g total) by mouth 2 (two) times daily as needed for moderate constipation.   LEVALBUTEROL (XOPENEX) 0.63 MG/3ML NEBULIZER SOLUTION    Take 3 mLs (0.63 mg total) by nebulization every 2 (two) hours as needed for wheezing or shortness of breath.   METOPROLOL (LOPRESSOR) 100 MG TABLET    Take 1 tablet (100 mg total) by mouth 2  (two) times daily.   MULTIVITAMIN (RENA-VIT) TABS TABLET    Take 1 tablet by mouth at bedtime.   NAPHAZOLINE-PHENIRAMINE (NAPHCON-A) 0.025-0.3 % OPHTHALMIC SOLUTION    Place 1 drop into both eyes 4 (four) times daily as needed for irritation.   SIMVASTATIN (ZOCOR) 20 MG TABLET    Take 1 tablet (20 mg total) by mouth daily at 6 PM.   TRAMADOL (ULTRAM) 50 MG TABLET    Take 50 mg by mouth every 6 (six) hours as needed.  Modified Medications   No medications on file  Discontinued Medications   No medications on file     Physical Exam:  Filed Vitals:   02/20/14 1217  BP: 130/69  Pulse: 72  Temp: 98.1 F (36.7 C)  Resp: 20    Physical Exam  Constitutional: She is well-developed, well-nourished, and in no distress.  HENT:  Mouth/Throat: Oropharynx is clear and moist. No oropharyngeal exudate.  Eyes: Conjunctivae and EOM are normal. Pupils are equal, round, and reactive to light.  Neck: Normal range of motion. Neck supple. No thyromegaly present.  Cardiovascular: Normal rate, regular rhythm and normal heart sounds.   Pulmonary/Chest: Effort normal and breath sounds normal.  Abdominal: Soft. Bowel sounds are normal. She exhibits no distension.  Musculoskeletal: Normal range of motion. She exhibits no edema.  Neurological: She is alert.  Skin: Skin is warm and dry.  Psychiatric: Affect normal.     Labs reviewed: Basic Metabolic Panel:  Recent Labs  12/24/13 0545 12/24/13 1315  01/05/14 1411 01/07/14 0520 01/08/14 0606  NA 140  --   < > 134* 131* 136*  K 3.9  --   < > 3.9 3.6* 4.0  CL 107  --   < > 97 94* 97  CO2 19  --   < > 25 25 27   GLUCOSE 129*  --   < > 173* 151* 132*  BUN 63*  --   < > 60* 50* 28*  CREATININE 3.99*  --   < > 4.62* 4.07* 2.89*  CALCIUM 7.5*  --   < > 7.8* 7.9* 7.9*  MG  --  2.0  --   --   --   --   PHOS  --   --   < > 5.0* 4.2 2.6  < > = values in this interval not displayed. Liver Function Tests:  Recent Labs  12/24/13 1315  01/05/14 1411  01/07/14 0520 01/08/14 0606  AST 25  --   --   --   --  ALT 22  --   --   --   --   ALKPHOS 99  --   --   --   --   BILITOT <0.2*  --   --   --   --   PROT 5.8*  --   --   --   --   ALBUMIN 1.5*  < > 1.7* 1.6* 1.6*  < > = values in this interval not displayed. No results found for this basename: LIPASE, AMYLASE,  in the last 8760 hours No results found for this basename: AMMONIA,  in the last 8760 hours CBC:  Recent Labs  12/22/13 1908  12/25/13 0553  01/06/14 0948 01/07/14 0829 01/08/14 0600  WBC 5.3  < > 7.6  < > 10.6* 9.5 9.5  NEUTROABS 3.3  --  3.6  --   --   --   --   HGB 10.8*  < > 8.7*  < > 6.9* 6.9* 8.4*  HCT 32.2*  < > 26.3*  < > 20.9* 20.8* 25.7*  MCV 90.2  < > 90.4  < > 92.1 92.0 92.1  PLT 324  < > 311  < > 401* 370 311  < > = values in this interval not displayed. Cardiac Enzymes:  Recent Labs  12/22/13 2026 12/23/13 0425 12/23/13 0955  TROPONINI <0.30 <0.30 <0.30   BNP: No components found with this basename: POCBNP,  CBG:  Recent Labs  01/07/14 2152 01/08/14 0557 01/08/14 1117  GLUCAP 110* 138* 138*   TSH:  Recent Labs  12/24/13 1315  TSH 3.430   A1C: Lab Results  Component Value Date   HGBA1C 5.9* 05/19/2013   CMP with Estimated GFR    Result: 02/05/2014 10:59 AM   ( Status: F )     C Sodium 136     135-145 mEq/L SLN   Potassium 4.0     3.5-5.3 mEq/L SLN   Chloride 102     96-112 mEq/L SLN   CO2 23     19-32 mEq/L SLN   Glucose 121   H 70-99 mg/dL SLN   BUN 37   H 6-23 mg/dL SLN   Creatinine 3.76   H 0.50-1.10 mg/dL SLN   Bilirubin, Total 0.4     0.2-1.2 mg/dL SLN C Alkaline Phosphatase 190   H 39-117 U/L SLN   AST/SGOT 15     0-37 U/L SLN   ALT/SGPT 21     0-35 U/L SLN   Total Protein 6.2     6.0-8.3 g/dL SLN   Albumin 2.5   L 3.5-5.2 g/dL SLN   Calcium 8.2   L 8.4-10.5 mg/dL SLN   Est GFR, African American 15   L  mL/min SLN   Est GFR, NonAfrican American 13   L  mL/min SLN C Lipid Profile    Result: 02/05/2014 10:59 AM     ( Status: F )       Cholesterol 204   H 0-200 mg/dL SLN C Triglyceride 74     <150 mg/dL SLN   HDL Cholesterol 45     >39 mg/dL SLN   Total Chol/HDL Ratio 4.5      Ratio SLN   VLDL Cholesterol (Calc) 15     0-40 mg/dL SLN   LDL Cholesterol (Calc) 144   H 0-99 mg/dL SLN C  Assessment/Plan 1. Community acquired pneumonia -resolved  2. HYPERLIPIDEMIA -LDL elevated -will increase zocor to 40 mg daily   3. PSYCHIATRIC  DISORDER -without behaviors, mood appears stable, no complaints from nursing   4. Chronic diastolic CHF (congestive heart failure) -remains stable  5. HYPERTENSION, ESSENTIAL, UNCONTROLLED -Patient is stable; continue current regimen. Will monitor and make changes as necessary.  6. 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

## 2014-02-21 DIAGNOSIS — N186 End stage renal disease: Secondary | ICD-10-CM | POA: Diagnosis not present

## 2014-02-24 DIAGNOSIS — H3560 Retinal hemorrhage, unspecified eye: Secondary | ICD-10-CM | POA: Diagnosis present

## 2014-02-24 DIAGNOSIS — Z992 Dependence on renal dialysis: Secondary | ICD-10-CM | POA: Diagnosis not present

## 2014-02-24 DIAGNOSIS — E1139 Type 2 diabetes mellitus with other diabetic ophthalmic complication: Secondary | ICD-10-CM | POA: Diagnosis not present

## 2014-02-24 DIAGNOSIS — H3320 Serous retinal detachment, unspecified eye: Secondary | ICD-10-CM | POA: Diagnosis not present

## 2014-02-24 DIAGNOSIS — I5032 Chronic diastolic (congestive) heart failure: Secondary | ICD-10-CM | POA: Diagnosis not present

## 2014-02-24 DIAGNOSIS — H913 Deaf nonspeaking, not elsewhere classified: Secondary | ICD-10-CM | POA: Diagnosis not present

## 2014-02-24 DIAGNOSIS — N186 End stage renal disease: Secondary | ICD-10-CM | POA: Diagnosis not present

## 2014-02-24 DIAGNOSIS — I509 Heart failure, unspecified: Secondary | ICD-10-CM | POA: Diagnosis not present

## 2014-02-24 DIAGNOSIS — M25559 Pain in unspecified hip: Secondary | ICD-10-CM | POA: Diagnosis not present

## 2014-02-24 DIAGNOSIS — I10 Essential (primary) hypertension: Secondary | ICD-10-CM | POA: Diagnosis not present

## 2014-02-24 DIAGNOSIS — E119 Type 2 diabetes mellitus without complications: Secondary | ICD-10-CM | POA: Diagnosis not present

## 2014-02-24 DIAGNOSIS — IMO0001 Reserved for inherently not codable concepts without codable children: Secondary | ICD-10-CM | POA: Diagnosis not present

## 2014-02-24 DIAGNOSIS — I120 Hypertensive chronic kidney disease with stage 5 chronic kidney disease or end stage renal disease: Secondary | ICD-10-CM | POA: Diagnosis present

## 2014-02-24 DIAGNOSIS — H338 Other retinal detachments: Secondary | ICD-10-CM | POA: Diagnosis present

## 2014-02-24 DIAGNOSIS — H35349 Macular cyst, hole, or pseudohole, unspecified eye: Secondary | ICD-10-CM | POA: Diagnosis present

## 2014-02-24 DIAGNOSIS — Z79899 Other long term (current) drug therapy: Secondary | ICD-10-CM | POA: Diagnosis not present

## 2014-02-24 DIAGNOSIS — E11359 Type 2 diabetes mellitus with proliferative diabetic retinopathy without macular edema: Secondary | ICD-10-CM | POA: Diagnosis not present

## 2014-02-24 DIAGNOSIS — R6889 Other general symptoms and signs: Secondary | ICD-10-CM | POA: Diagnosis not present

## 2014-02-26 DIAGNOSIS — I10 Essential (primary) hypertension: Secondary | ICD-10-CM | POA: Diagnosis not present

## 2014-03-17 ENCOUNTER — Non-Acute Institutional Stay (SKILLED_NURSING_FACILITY): Payer: Medicare Other | Admitting: Nurse Practitioner

## 2014-03-17 DIAGNOSIS — I10 Essential (primary) hypertension: Secondary | ICD-10-CM | POA: Diagnosis not present

## 2014-03-17 DIAGNOSIS — I509 Heart failure, unspecified: Secondary | ICD-10-CM

## 2014-03-17 DIAGNOSIS — I471 Supraventricular tachycardia: Secondary | ICD-10-CM

## 2014-03-17 DIAGNOSIS — I5032 Chronic diastolic (congestive) heart failure: Secondary | ICD-10-CM | POA: Diagnosis not present

## 2014-03-17 DIAGNOSIS — I498 Other specified cardiac arrhythmias: Secondary | ICD-10-CM

## 2014-03-17 DIAGNOSIS — D649 Anemia, unspecified: Secondary | ICD-10-CM | POA: Diagnosis not present

## 2014-03-17 DIAGNOSIS — N186 End stage renal disease: Secondary | ICD-10-CM

## 2014-03-17 DIAGNOSIS — E1129 Type 2 diabetes mellitus with other diabetic kidney complication: Secondary | ICD-10-CM

## 2014-03-17 DIAGNOSIS — R609 Edema, unspecified: Secondary | ICD-10-CM

## 2014-03-17 DIAGNOSIS — E1122 Type 2 diabetes mellitus with diabetic chronic kidney disease: Secondary | ICD-10-CM

## 2014-03-17 DIAGNOSIS — E785 Hyperlipidemia, unspecified: Secondary | ICD-10-CM

## 2014-03-17 DIAGNOSIS — N184 Chronic kidney disease, stage 4 (severe): Secondary | ICD-10-CM

## 2014-03-17 DIAGNOSIS — F489 Nonpsychotic mental disorder, unspecified: Secondary | ICD-10-CM

## 2014-03-17 NOTE — Progress Notes (Signed)
Patient ID: [name], female   DOB: Nov 19, 1956, 58 y.o.   MRN: [id number]   Nursing Home Location:  [place] of Service: SNF (31)  PCP: Pcp Not In System  No Known Allergies  Chief Complaint  Patient presents with  . Medical Managment of Chronic Issues    HPI:  58 year old female resident with history of deafness/mutism. She is seen today for routine follow up and medical management of multiple complex medical problems. She has been in her usual state of health and no concerns are reported by nursing staff. She receives HD 3x/week (M/W/F) schedule and there have been no complications with her treatment reported.   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 Unable to obtain a complete ROS; nursing staff does not have any new concerns or issues at this time.    Past Medical History  Diagnosis Date  . Hyperlipidemia   . Hypertension     a. Has previously refused blood pressure meds.   . Deaf     Divorced from husband but lives with him. Has daughter but she does not care for her.  . Bilateral leg edema     a. Chronic.  Marland Kitchen Psychiatric disorder     She frequently exhibits paranoia and has been diagnosed with psychotic d/o NOS during hospital stay in the past. She is tangetial and perseverative during her visits. She apparently has had a bad experience with mental health in [place] in the past and refuses to discuss mental issues for fear that she will be sent back there. Her paranoia, communication issues, financial woes and lack of fam  . Fibroid uterus   . Anemia     Due to fibroids.  . Heart murmur   . Diabetes mellitus     a. Per PCP note 2012 (A1C 9.2) - pt unwilling to take meds and was educated on risk of uncontrolled DM.  b. A1C 5.9 in 11/2013.   Past Surgical History  Procedure Laterality Date  . No past surgeries    . Insertion of dialysis catheter Right  01/02/2014    Procedure: INSERTION OF DIALYSIS CATHETER;  Surgeon: Rosetta Posner, MD;  Location: [place];  Service: Vascular;  Laterality: Right;  . Av fistula placement Left 01/02/2014    Procedure: ARTERIOVENOUS (AV) FISTULA CREATION;  Surgeon: Rosetta Posner, MD;  Location: [place];  Service: Vascular;  Laterality: Left;   Social History:   reports that she has never smoked. She does not have any smokeless tobacco history on file. She reports that she does not drink alcohol or use illicit drugs.  Family History  Problem Relation Age of Onset  . Other Father     Drowned from fishing?  . Heart disease      Medications: Patient's Medications  New Prescriptions   No medications on file  Previous Medications   ASPIRIN 325 MG TABLET    Take 1 tablet (325 mg total) by mouth daily.   BISACODYL (DULCOLAX) 10 MG SUPPOSITORY    Place 1 suppository (10 mg total) rectally daily as needed for mild constipation, moderate constipation or severe constipation.   CALCIUM ACETATE (PHOSLO) 667 MG CAPSULE    Take 1 capsule (667 mg total) by mouth 3 (three) times daily with meals.   CLONIDINE (CATAPRES) 0.1 MG TABLET    Take 1 tablet (0.1 mg  total) by mouth 3 (three) times daily.   DARBEPOETIN (ARANESP) 100 MCG/0.5ML SOLN INJECTION    Inject 0.5 mLs (100 mcg total) into the skin every Tuesday at 6 PM.   DIPHENHYDRAMINE (BENADRYL) 25 MG CAPSULE    Take 25 mg by mouth every 6 (six) hours as needed.   DOCUSATE SODIUM 100 MG CAPS    Take 100 mg by mouth 2 (two) times daily.   HYDRALAZINE (APRESOLINE) 100 MG TABLET    Take 1 tablet (100 mg total) by mouth every 8 (eight) hours.   ISOSORBIDE MONONITRATE (IMDUR) 30 MG 24 HR TABLET    Take 1 tablet (30 mg total) by mouth daily.   LACTULOSE (CHRONULAC) 10 GM/15ML SOLUTION    Take 30 mLs (20 g total) by mouth 2 (two) times daily as needed for moderate constipation.   LEVALBUTEROL (XOPENEX) 0.63 MG/3ML NEBULIZER SOLUTION    Take 3 mLs (0.63 mg total) by nebulization every 2  (two) hours as needed for wheezing or shortness of breath.   METOPROLOL (LOPRESSOR) 100 MG TABLET    Take 1 tablet (100 mg total) by mouth 2 (two) times daily.   MULTIVITAMIN (RENA-VIT) TABS TABLET    Take 1 tablet by mouth at bedtime.   NAPHAZOLINE-PHENIRAMINE (NAPHCON-A) 0.025-0.3 % OPHTHALMIC SOLUTION    Place 1 drop into both eyes 4 (four) times daily as needed for irritation.   SIMVASTATIN (ZOCOR) 20 MG TABLET    Take 1 tablet (20 mg total) by mouth daily at 6 PM.   SIMVASTATIN (ZOCOR) 40 MG TABLET    Take 40 mg by mouth daily.   TRAMADOL (ULTRAM) 50 MG TABLET    Take 50 mg by mouth every 6 (six) hours as needed.  Modified Medications   No medications on file  Discontinued Medications   No medications on file     Physical Exam: Filed Vitals:   03/17/14 1148  BP: 149/75  Pulse: 78  Temp: 98 F (36.7 C)  Resp: 20  Weight: 185 lb (83.915 kg)   Physical Exam  Nursing note and vitals reviewed. Constitutional: She is well-developed, well-nourished, and in no distress. No distress.  HENT:  Head: Normocephalic and atraumatic.  Eyes: Conjunctivae are normal. Pupils are equal, round, and reactive to light.  Cardiovascular: Regular rhythm, normal heart sounds and intact distal pulses.   Pulmonary/Chest: Effort normal and breath sounds normal. No respiratory distress. She has no wheezes. She exhibits no tenderness.  Abdominal: Soft. Bowel sounds are normal. She exhibits no distension and no mass. There is no guarding.  Musculoskeletal: Normal range of motion. She exhibits no tenderness. Edema: trace edema to right ankle.  Neurological: She is alert.  Skin: Skin is warm and dry. She is not diaphoretic. No pallor.  Psychiatric: Mood and affect normal.      Labs reviewed: CMP with Estimated GFR    Result: 02/05/2014 10:59 AM   ( Status: F )     C Sodium 136     135-145 mEq/L SLN   Potassium 4.0     3.5-5.3 mEq/L SLN   Chloride 102     96-112 mEq/L SLN   CO2 23     19-32 mEq/L SLN     Glucose 121   H 70-99 mg/dL SLN   BUN 37   H 6-23 mg/dL SLN   Creatinine 3.76   H 0.50-1.10 mg/dL SLN   Bilirubin, Total 0.4     0.2-1.2 mg/dL SLN C Alkaline Phosphatase 190   H  39-117 U/L SLN   AST/SGOT 15     0-37 U/L SLN   ALT/SGPT 21     0-35 U/L SLN   Total Protein 6.2     6.0-8.3 g/dL SLN   Albumin 2.5   L 3.5-5.2 g/dL SLN   Calcium 8.2   L 8.4-10.5 mg/dL SLN   Est GFR, African American 15   L  mL/min SLN   Est GFR, NonAfrican American 13   L  mL/min SLN C Lipid Profile    Result: 02/05/2014 10:59 AM   ( Status: F )       Cholesterol 204   H 0-200 mg/dL SLN C Triglyceride 74     <150 mg/dL SLN   HDL Cholesterol 45     >39 mg/dL SLN   Total Chol/HDL Ratio 4.5      Ratio SLN   VLDL Cholesterol (Calc) 15     0-40 mg/dL SLN   LDL Cholesterol (Calc) 144   H 0-99 mg/dL SLN C Hepatitis Acute Panel    Result: 02/05/2014 11:03 AM   ( Status: F )       Hepatitis B Surface Antigen NEGATIVE     NEGATIVE  SLN   Hepatitis C Antibody NEGATIVE     NEGATIVE  SLN   Hepatitis B Core Ab, IgM NON REACTIVE     NON REACTIVE  SLN C Hepatitis A Antibody, IgM NON REACTIVE     NON REACTIVE  SLN   -- END OF REPORT --  Basic Metabolic Panel:  Recent Labs  12/24/13 0545 12/24/13 1315  01/05/14 1411 01/07/14 0520 01/08/14 0606  NA 140  --   < > 134* 131* 136*  K 3.9  --   < > 3.9 3.6* 4.0  CL 107  --   < > 97 94* 97  CO2 19  --   < > 25 25 27   GLUCOSE 129*  --   < > 173* 151* 132*  BUN 63*  --   < > 60* 50* 28*  CREATININE 3.99*  --   < > 4.62* 4.07* 2.89*  CALCIUM 7.5*  --   < > 7.8* 7.9* 7.9*  MG  --  2.0  --   --   --   --   PHOS  --   --   < > 5.0* 4.2 2.6  < > = values in this interval not displayed. Liver Function Tests:  Recent Labs  12/24/13 1315  01/05/14 1411 01/07/14 0520 01/08/14 0606  AST 25  --   --   --   --   ALT 22  --   --   --   --   ALKPHOS 99  --   --   --   --   BILITOT <0.2*  --   --   --   --   PROT 5.8*  --   --   --   --   ALBUMIN 1.5*  < > 1.7* 1.6* 1.6*   < > = values in this interval not displayed. No results found for this basename: LIPASE, AMYLASE,  in the last 8760 hours No results found for this basename: AMMONIA,  in the last 8760 hours CBC:  Recent Labs  12/22/13 1908  12/25/13 0553  01/06/14 0948 01/07/14 0829 01/08/14 0600  WBC 5.3  < > 7.6  < > 10.6* 9.5 9.5  NEUTROABS 3.3  --  3.6  --   --   --   --  HGB 10.8*  < > 8.7*  < > 6.9* 6.9* 8.4*  HCT 32.2*  < > 26.3*  < > 20.9* 20.8* 25.7*  MCV 90.2  < > 90.4  < > 92.1 92.0 92.1  PLT 324  < > 311  < > 401* 370 311  < > = values in this interval not displayed. Cardiac Enzymes:  Recent Labs  12/22/13 2026 12/23/13 0425 12/23/13 0955  TROPONINI <0.30 <0.30 <0.30   BNP: No components found with this basename: POCBNP,  CBG:  Recent Labs  01/07/14 2152 01/08/14 0557 01/08/14 1117  GLUCAP 110* 138* 138*   TSH:  Recent Labs  12/24/13 1315  TSH 3.430   A1C: Lab Results  Component Value Date   HGBA1C 5.9* 10-Jun-202014    Assessment/Plan 1. HYPERTENSION, ESSENTIAL, UNCONTROLLED BP trending [id number] systolic, some readings [id number]. Will increase scheduled metoprolol to 200mg  bid.; hold for sbp less than 120; will have staff record BP after dialysis   2. Chronic diastolic CHF (congestive heart failure) Stable currently, tolerating M/W/F HD without issue. Trace edema right leg, but asymptomatic for fluid volume overload.   3. 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 Nephrology following for renal needs.   4. HYPERLIPIDEMIA Zocor increased to 40mg  po, patient tolerating without side effect. Will maintain dosage.  5. LEG EDEMA Not significant today. Trace right leg, non-pitting. Will continue to monitor and intervene as needed.  6. PSYCHIATRIC DISORDER Currently stable without report of hallucinations, violent outbursts, or other altered mood or behavior. Will continue to monitor and treat as needed.    6. Anemia Stable. No significant decrease in hgb. Will continue to monitor and intervene as needed. aranesp given in dialysis

## 2014-03-19 DIAGNOSIS — N186 End stage renal disease: Secondary | ICD-10-CM | POA: Diagnosis not present

## 2014-03-19 DIAGNOSIS — H3320 Serous retinal detachment, unspecified eye: Secondary | ICD-10-CM | POA: Diagnosis not present

## 2014-03-19 DIAGNOSIS — E119 Type 2 diabetes mellitus without complications: Secondary | ICD-10-CM | POA: Diagnosis not present

## 2014-03-19 DIAGNOSIS — H2510 Age-related nuclear cataract, unspecified eye: Secondary | ICD-10-CM | POA: Diagnosis not present

## 2014-03-19 DIAGNOSIS — H3340 Traction detachment of retina, unspecified eye: Secondary | ICD-10-CM | POA: Diagnosis not present

## 2014-03-20 DIAGNOSIS — S0510XA Contusion of eyeball and orbital tissues, unspecified eye, initial encounter: Secondary | ICD-10-CM | POA: Diagnosis not present

## 2014-03-20 DIAGNOSIS — I471 Supraventricular tachycardia: Secondary | ICD-10-CM | POA: Diagnosis not present

## 2014-03-20 DIAGNOSIS — N184 Chronic kidney disease, stage 4 (severe): Secondary | ICD-10-CM | POA: Diagnosis not present

## 2014-03-20 DIAGNOSIS — F39 Unspecified mood [affective] disorder: Secondary | ICD-10-CM | POA: Diagnosis not present

## 2014-03-20 DIAGNOSIS — I10 Essential (primary) hypertension: Secondary | ICD-10-CM | POA: Diagnosis not present

## 2014-03-20 DIAGNOSIS — E785 Hyperlipidemia, unspecified: Secondary | ICD-10-CM | POA: Diagnosis not present

## 2014-03-20 DIAGNOSIS — N186 End stage renal disease: Secondary | ICD-10-CM | POA: Diagnosis not present

## 2014-03-20 DIAGNOSIS — N289 Disorder of kidney and ureter, unspecified: Secondary | ICD-10-CM | POA: Diagnosis not present

## 2014-03-20 DIAGNOSIS — I509 Heart failure, unspecified: Secondary | ICD-10-CM | POA: Diagnosis not present

## 2014-03-20 DIAGNOSIS — I5032 Chronic diastolic (congestive) heart failure: Secondary | ICD-10-CM | POA: Diagnosis not present

## 2014-03-20 DIAGNOSIS — Z9889 Other specified postprocedural states: Secondary | ICD-10-CM | POA: Diagnosis not present

## 2014-03-20 DIAGNOSIS — H3340 Traction detachment of retina, unspecified eye: Secondary | ICD-10-CM | POA: Diagnosis not present

## 2014-03-20 DIAGNOSIS — N2581 Secondary hyperparathyroidism of renal origin: Secondary | ICD-10-CM | POA: Diagnosis not present

## 2014-03-20 DIAGNOSIS — R609 Edema, unspecified: Secondary | ICD-10-CM | POA: Diagnosis not present

## 2014-03-20 DIAGNOSIS — E1129 Type 2 diabetes mellitus with other diabetic kidney complication: Secondary | ICD-10-CM | POA: Diagnosis not present

## 2014-03-20 DIAGNOSIS — Z992 Dependence on renal dialysis: Secondary | ICD-10-CM | POA: Diagnosis not present

## 2014-03-20 DIAGNOSIS — D649 Anemia, unspecified: Secondary | ICD-10-CM | POA: Diagnosis not present

## 2014-03-20 DIAGNOSIS — H913 Deaf nonspeaking, not elsewhere classified: Secondary | ICD-10-CM | POA: Diagnosis not present

## 2014-03-24 DIAGNOSIS — N186 End stage renal disease: Secondary | ICD-10-CM | POA: Diagnosis not present

## 2014-03-25 DIAGNOSIS — I10 Essential (primary) hypertension: Secondary | ICD-10-CM | POA: Diagnosis not present

## 2014-03-25 DIAGNOSIS — I509 Heart failure, unspecified: Secondary | ICD-10-CM | POA: Diagnosis not present

## 2014-03-25 DIAGNOSIS — D649 Anemia, unspecified: Secondary | ICD-10-CM | POA: Diagnosis not present

## 2014-03-25 DIAGNOSIS — N186 End stage renal disease: Secondary | ICD-10-CM | POA: Diagnosis not present

## 2014-03-25 DIAGNOSIS — E785 Hyperlipidemia, unspecified: Secondary | ICD-10-CM | POA: Diagnosis not present

## 2014-03-25 DIAGNOSIS — I5032 Chronic diastolic (congestive) heart failure: Secondary | ICD-10-CM | POA: Diagnosis not present

## 2014-03-25 DIAGNOSIS — Z992 Dependence on renal dialysis: Secondary | ICD-10-CM | POA: Diagnosis not present

## 2014-03-25 DIAGNOSIS — N289 Disorder of kidney and ureter, unspecified: Secondary | ICD-10-CM | POA: Diagnosis not present

## 2014-03-25 DIAGNOSIS — R609 Edema, unspecified: Secondary | ICD-10-CM | POA: Diagnosis not present

## 2014-03-25 DIAGNOSIS — I471 Supraventricular tachycardia: Secondary | ICD-10-CM | POA: Diagnosis not present

## 2014-03-25 DIAGNOSIS — H913 Deaf nonspeaking, not elsewhere classified: Secondary | ICD-10-CM | POA: Diagnosis not present

## 2014-03-25 DIAGNOSIS — E1129 Type 2 diabetes mellitus with other diabetic kidney complication: Secondary | ICD-10-CM | POA: Diagnosis not present

## 2014-03-25 DIAGNOSIS — F39 Unspecified mood [affective] disorder: Secondary | ICD-10-CM | POA: Diagnosis not present

## 2014-03-25 DIAGNOSIS — N184 Chronic kidney disease, stage 4 (severe): Secondary | ICD-10-CM | POA: Diagnosis not present

## 2014-03-27 DIAGNOSIS — H3340 Traction detachment of retina, unspecified eye: Secondary | ICD-10-CM | POA: Diagnosis not present

## 2014-04-02 NOTE — Progress Notes (Signed)
I have seen and examined this patient and agree with the plan of care Sherril Croon 04/02/2014, 1:52 PM

## 2014-04-17 ENCOUNTER — Non-Acute Institutional Stay (SKILLED_NURSING_FACILITY): Payer: Medicare Other | Admitting: Nurse Practitioner

## 2014-04-17 DIAGNOSIS — I5032 Chronic diastolic (congestive) heart failure: Secondary | ICD-10-CM | POA: Diagnosis not present

## 2014-04-17 DIAGNOSIS — E785 Hyperlipidemia, unspecified: Secondary | ICD-10-CM

## 2014-04-17 DIAGNOSIS — I10 Essential (primary) hypertension: Secondary | ICD-10-CM

## 2014-04-17 DIAGNOSIS — I509 Heart failure, unspecified: Secondary | ICD-10-CM

## 2014-04-17 DIAGNOSIS — N186 End stage renal disease: Secondary | ICD-10-CM

## 2014-04-17 NOTE — Progress Notes (Signed)
Patient ID: [name], female   DOB: [birth year], 58 y.o.   MRN: [id number]    Nursing Home Location:  [place] of Service: SNF (31)  PCP: Pcp Not In System  No Known Allergies  Chief Complaint  Patient presents with  . Medical Management of Chronic Issues    HPI:  58 year old female resident with history of deafness/mutism. She is seen today for routine follow up and medical management of multiple complex medical problems. Pt recently had cataracts removed, treatment going well however sometimes she is declining eye drops, staff reports they suspect it is the way the drops are presented due to the fact she is deaf and now with increase in blurred vision-- others have no issues.  She has been in her usual state of health and no concerns are reported by nursing staff. 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 Unable to obtain a complete ROS    Past Medical History  Diagnosis Date  . Hyperlipidemia   . Hypertension     a. Has previously refused blood pressure meds.   . Deaf     Divorced from husband but lives with him. Has daughter but she does not care for her.  . Bilateral leg edema     a. Chronic.  Marland Kitchen Psychiatric disorder     She frequently exhibits paranoia and has been diagnosed with psychotic d/o NOS during hospital stay in the past. She is tangetial and perseverative during her visits. She apparently has had a bad experience with mental health in [place] in the past and refuses to discuss mental issues for fear that she will be sent back there. Her paranoia, communication issues, financial woes and lack of fam  . Fibroid uterus   . Anemia     Due to fibroids.  . Heart murmur   . Diabetes mellitus     a. Per PCP note 2012 (A1C 9.2) - pt unwilling to take meds and was educated on risk of uncontrolled DM.  b. A1C 5.9 in 11/2013.    Past Surgical History  Procedure Laterality Date  . No past surgeries    . Insertion of dialysis catheter Right 01/02/2014    Procedure: INSERTION OF DIALYSIS CATHETER;  Surgeon: Rosetta Posner, MD;  Location: [place];  Service: Vascular;  Laterality: Right;  . Av fistula placement Left 01/02/2014    Procedure: ARTERIOVENOUS (AV) FISTULA CREATION;  Surgeon: Rosetta Posner, MD;  Location: [place];  Service: Vascular;  Laterality: Left;   Social History:   reports that she has never smoked. She does not have any smokeless tobacco history on file. She reports that she does not drink alcohol or use illicit drugs.  Family History  Problem Relation Age of Onset  . Other Father     Drowned from fishing?  . Heart disease      Medications: Patient's Medications  New Prescriptions   No medications on file  Previous Medications   BISACODYL (DULCOLAX) 10 MG SUPPOSITORY    Place 1 suppository (10 mg total) rectally daily as needed for mild constipation, moderate constipation or severe constipation.   CALCIUM ACETATE (PHOSLO) 667 MG CAPSULE    Take 1 capsule (667 mg total) by mouth 3 (three) times daily with meals.   CLONIDINE (CATAPRES) 0.1 MG TABLET  Take 1 tablet (0.1 mg total) by mouth 3 (three) times daily.   DARBEPOETIN (ARANESP) 100 MCG/0.5ML SOLN INJECTION    Inject 0.5 mLs (100 mcg total) into the skin every Tuesday at 6 PM.   DIPHENHYDRAMINE (BENADRYL) 25 MG CAPSULE    Take 25 mg by mouth every 6 (six) hours as needed.   DOCUSATE SODIUM 100 MG CAPS    Take 100 mg by mouth 2 (two) times daily.   HYDRALAZINE (APRESOLINE) 100 MG TABLET    Take 1 tablet (100 mg total) by mouth every 8 (eight) hours.   ISOSORBIDE MONONITRATE (IMDUR) 30 MG 24 HR TABLET    Take 1 tablet (30 mg total) by mouth daily.   LACTULOSE (CHRONULAC) 10 GM/15ML SOLUTION    Take 30 mLs (20 g total) by mouth 2 (two) times daily as needed for moderate constipation.   LEVALBUTEROL (XOPENEX) 0.63 MG/3ML NEBULIZER SOLUTION    Take 3  mLs (0.63 mg total) by nebulization every 2 (two) hours as needed for wheezing or shortness of breath.   METOPROLOL (LOPRESSOR) 100 MG TABLET    Take 1 tablet (100 mg total) by mouth 2 (two) times daily.   MULTIVITAMIN (RENA-VIT) TABS TABLET    Take 1 tablet by mouth at bedtime.   NAPHAZOLINE-PHENIRAMINE (NAPHCON-A) 0.025-0.3 % OPHTHALMIC SOLUTION    Place 1 drop into both eyes 4 (four) times daily as needed for irritation.   SIMVASTATIN (ZOCOR) 40 MG TABLET    Take 40 mg by mouth daily.   TRAMADOL (ULTRAM) 50 MG TABLET    Take 50 mg by mouth every 6 (six) hours as needed.  Modified Medications   No medications on file  Discontinued Medications   SIMVASTATIN (ZOCOR) 20 MG TABLET    Take 1 tablet (20 mg total) by mouth daily at 6 PM.     Physical Exam:  Filed Vitals:   04/17/14 1342  BP: 155/77  Pulse: 70  Temp: 98.5 F (36.9 C)  Resp: 20  Weight: 167 lb (75.751 kg)   Physical Exam  Nursing note and vitals reviewed. Constitutional: She is well-developed, well-nourished, and in no distress.  HENT:  Head: Normocephalic and atraumatic.  Neck: Normal range of motion. Neck supple.  Cardiovascular: Regular rhythm, normal heart sounds and intact distal pulses.   Pulmonary/Chest: Effort normal and breath sounds normal. No respiratory distress. She has no wheezes. She exhibits no tenderness.  Abdominal: Soft. Bowel sounds are normal. She exhibits no distension and no mass. There is no guarding.  Musculoskeletal: Normal range of motion. She exhibits no edema and no tenderness.  Neurological: She is alert.  Skin: Skin is warm and dry. She is not diaphoretic. No pallor.  Psychiatric: Mood and affect normal.      Labs reviewed: Basic Metabolic Panel:  Recent Labs  12/24/13 0545 12/24/13 1315  01/05/14 1411 01/07/14 0520 01/08/14 0606  NA 140  --   < > 134* 131* 136*  K 3.9  --   < > 3.9 3.6* 4.0  CL 107  --   < > 97 94* 97  CO2 19  --   < > 25 25 27   GLUCOSE 129*  --   < >  173* 151* 132*  BUN 63*  --   < > 60* 50* 28*  CREATININE 3.99*  --   < > 4.62* 4.07* 2.89*  CALCIUM 7.5*  --   < > 7.8* 7.9* 7.9*  MG  --  2.0  --   --   --   --  PHOS  --   --   < > 5.0* 4.2 2.6  < > = values in this interval not displayed. Liver Function Tests:  Recent Labs  12/24/13 1315  01/05/14 1411 01/07/14 0520 01/08/14 0606  AST 25  --   --   --   --   ALT 22  --   --   --   --   ALKPHOS 99  --   --   --   --   BILITOT <0.2*  --   --   --   --   PROT 5.8*  --   --   --   --   ALBUMIN 1.5*  < > 1.7* 1.6* 1.6*  < > = values in this interval not displayed. No results found for this basename: LIPASE, AMYLASE,  in the last 8760 hours No results found for this basename: AMMONIA,  in the last 8760 hours CBC:  Recent Labs  12/22/13 1908  12/25/13 0553  01/06/14 0948 01/07/14 0829 01/08/14 0600  WBC 5.3  < > 7.6  < > 10.6* 9.5 9.5  NEUTROABS 3.3  --  3.6  --   --   --   --   HGB 10.8*  < > 8.7*  < > 6.9* 6.9* 8.4*  HCT 32.2*  < > 26.3*  < > 20.9* 20.8* 25.7*  MCV 90.2  < > 90.4  < > 92.1 92.0 92.1  PLT 324  < > 311  < > 401* 370 311  < > = values in this interval not displayed. Cardiac Enzymes:  Recent Labs  12/22/13 2026 12/23/13 0425 12/23/13 0955  TROPONINI <0.30 <0.30 <0.30   BNP: No components found with this basename: POCBNP,  CBG:  Recent Labs  01/07/14 2152 01/08/14 0557 01/08/14 1117  GLUCAP 110* 138* 138*   TSH:  Recent Labs  12/24/13 1315  TSH 3.430   A1C: Lab Results  Component Value Date   HGBA1C 5.9* 07-16-202014   Labs reviewed:  CMP with Estimated GFR  Result: 02/05/2014 10:59 AM ( Status: F ) C  Sodium 136 135-145 mEq/L SLN  Potassium 4.0 3.5-5.3 mEq/L SLN  Chloride 102 96-112 mEq/L SLN  CO2 23 19-32 mEq/L SLN  Glucose 121 H 70-99 mg/dL SLN  BUN 37 H 6-23 mg/dL SLN  Creatinine 3.76 H 0.50-1.10 mg/dL SLN  Bilirubin, Total 0.4 0.2-1.2 mg/dL SLN C  Alkaline Phosphatase 190 H 39-117 U/L SLN  AST/SGOT 15 0-37 U/L SLN   ALT/SGPT 21 0-35 U/L SLN  Total Protein 6.2 6.0-8.3 g/dL SLN  Albumin 2.5 L 3.5-5.2 g/dL SLN  Calcium 8.2 L 8.4-10.5 mg/dL SLN  Est GFR, African American 15 L mL/min SLN  Est GFR, NonAfrican American 13 L mL/min SLN C  Lipid Profile  Result: 02/05/2014 10:59 AM ( Status: F )  Cholesterol 204 H 0-200 mg/dL SLN C  Triglyceride 74 <150 mg/dL SLN  HDL Cholesterol 45 >39 mg/dL SLN  Total Chol/HDL Ratio 4.5 Ratio SLN  VLDL Cholesterol (Calc) 15 0-40 mg/dL SLN  LDL Cholesterol (Calc) 144 H 0-99 mg/dL SLN C  Hepatitis Acute Panel  Result: 02/05/2014 11:03 AM ( Status: F )  Hepatitis B Surface Antigen NEGATIVE NEGATIVE SLN  Hepatitis C Antibody NEGATIVE NEGATIVE SLN  Hepatitis B Core Ab, IgM NON REACTIVE NON REACTIVE SLN C  Hepatitis A Antibody, IgM NON REACTIVE NON REACTIVE SLN    Assessment/Plan 1. Chronic diastolic CHF (congestive heart failure) -stable, no signs of worsening CHF  2. End stage renal disease -conts dialysis MWF tolerating treatments  3. HYPERTENSION, ESSENTIAL, UNCONTROLLED -some elevation, however has improved since last month, will cont current medications at this time and provide ongoing monitoring   4. Other and unspecified hyperlipidemia -on review it appears zocor was never increased to 40mg  qhs, will increase at this time.   5. Cataracts -conts to follow up with opthalmology s/p removal, encouraged staff to provide education to other staff members on how to appropriately approach pt to ensure she is given her eye drops

## 2014-04-22 DIAGNOSIS — B351 Tinea unguium: Secondary | ICD-10-CM | POA: Diagnosis not present

## 2014-04-22 DIAGNOSIS — E1159 Type 2 diabetes mellitus with other circulatory complications: Secondary | ICD-10-CM | POA: Diagnosis not present

## 2014-04-22 DIAGNOSIS — M79609 Pain in unspecified limb: Secondary | ICD-10-CM | POA: Diagnosis not present

## 2014-04-22 DIAGNOSIS — I798 Other disorders of arteries, arterioles and capillaries in diseases classified elsewhere: Secondary | ICD-10-CM | POA: Diagnosis not present

## 2014-04-23 DIAGNOSIS — N186 End stage renal disease: Secondary | ICD-10-CM | POA: Diagnosis not present

## 2014-04-24 DIAGNOSIS — D631 Anemia in chronic kidney disease: Secondary | ICD-10-CM | POA: Diagnosis not present

## 2014-04-24 DIAGNOSIS — N039 Chronic nephritic syndrome with unspecified morphologic changes: Secondary | ICD-10-CM | POA: Diagnosis not present

## 2014-04-24 DIAGNOSIS — N186 End stage renal disease: Secondary | ICD-10-CM | POA: Diagnosis not present

## 2014-04-24 DIAGNOSIS — E1129 Type 2 diabetes mellitus with other diabetic kidney complication: Secondary | ICD-10-CM | POA: Diagnosis not present

## 2014-04-24 DIAGNOSIS — N2581 Secondary hyperparathyroidism of renal origin: Secondary | ICD-10-CM | POA: Diagnosis not present

## 2014-05-07 DIAGNOSIS — E785 Hyperlipidemia, unspecified: Secondary | ICD-10-CM | POA: Diagnosis not present

## 2014-05-07 DIAGNOSIS — N185 Chronic kidney disease, stage 5: Secondary | ICD-10-CM | POA: Diagnosis not present

## 2014-05-07 DIAGNOSIS — D649 Anemia, unspecified: Secondary | ICD-10-CM | POA: Diagnosis not present

## 2014-05-07 DIAGNOSIS — I10 Essential (primary) hypertension: Secondary | ICD-10-CM | POA: Diagnosis not present

## 2014-05-20 DIAGNOSIS — N186 End stage renal disease: Secondary | ICD-10-CM | POA: Diagnosis not present

## 2014-05-24 DIAGNOSIS — N186 End stage renal disease: Secondary | ICD-10-CM | POA: Diagnosis not present

## 2014-05-25 DIAGNOSIS — N186 End stage renal disease: Secondary | ICD-10-CM | POA: Diagnosis not present

## 2014-05-25 DIAGNOSIS — D509 Iron deficiency anemia, unspecified: Secondary | ICD-10-CM | POA: Diagnosis not present

## 2014-05-25 DIAGNOSIS — D631 Anemia in chronic kidney disease: Secondary | ICD-10-CM | POA: Diagnosis not present

## 2014-05-25 DIAGNOSIS — N2581 Secondary hyperparathyroidism of renal origin: Secondary | ICD-10-CM | POA: Diagnosis not present

## 2014-05-25 DIAGNOSIS — E1129 Type 2 diabetes mellitus with other diabetic kidney complication: Secondary | ICD-10-CM | POA: Diagnosis not present

## 2014-05-28 DIAGNOSIS — H9190 Unspecified hearing loss, unspecified ear: Secondary | ICD-10-CM | POA: Diagnosis not present

## 2014-05-28 DIAGNOSIS — E785 Hyperlipidemia, unspecified: Secondary | ICD-10-CM | POA: Diagnosis not present

## 2014-05-28 DIAGNOSIS — E119 Type 2 diabetes mellitus without complications: Secondary | ICD-10-CM | POA: Diagnosis not present

## 2014-05-28 DIAGNOSIS — D649 Anemia, unspecified: Secondary | ICD-10-CM | POA: Diagnosis not present

## 2014-06-10 DIAGNOSIS — F4320 Adjustment disorder, unspecified: Secondary | ICD-10-CM | POA: Diagnosis not present

## 2014-06-11 DIAGNOSIS — Z452 Encounter for adjustment and management of vascular access device: Secondary | ICD-10-CM | POA: Diagnosis not present

## 2014-06-11 DIAGNOSIS — N186 End stage renal disease: Secondary | ICD-10-CM | POA: Diagnosis not present

## 2014-06-17 DIAGNOSIS — N189 Chronic kidney disease, unspecified: Secondary | ICD-10-CM | POA: Diagnosis not present

## 2014-06-17 DIAGNOSIS — E119 Type 2 diabetes mellitus without complications: Secondary | ICD-10-CM | POA: Diagnosis not present

## 2014-06-17 DIAGNOSIS — D649 Anemia, unspecified: Secondary | ICD-10-CM | POA: Diagnosis not present

## 2014-06-17 DIAGNOSIS — E785 Hyperlipidemia, unspecified: Secondary | ICD-10-CM | POA: Diagnosis not present

## 2014-06-18 DIAGNOSIS — I10 Essential (primary) hypertension: Secondary | ICD-10-CM | POA: Diagnosis not present

## 2014-06-18 DIAGNOSIS — F22 Delusional disorders: Secondary | ICD-10-CM | POA: Diagnosis not present

## 2014-06-18 DIAGNOSIS — F319 Bipolar disorder, unspecified: Secondary | ICD-10-CM | POA: Diagnosis not present

## 2014-06-22 DIAGNOSIS — F411 Generalized anxiety disorder: Secondary | ICD-10-CM | POA: Diagnosis not present

## 2014-06-23 DIAGNOSIS — N186 End stage renal disease: Secondary | ICD-10-CM | POA: Diagnosis not present

## 2014-06-23 DIAGNOSIS — E119 Type 2 diabetes mellitus without complications: Secondary | ICD-10-CM | POA: Diagnosis not present

## 2014-06-24 DIAGNOSIS — D631 Anemia in chronic kidney disease: Secondary | ICD-10-CM | POA: Diagnosis not present

## 2014-06-24 DIAGNOSIS — N039 Chronic nephritic syndrome with unspecified morphologic changes: Secondary | ICD-10-CM | POA: Diagnosis not present

## 2014-06-24 DIAGNOSIS — N2581 Secondary hyperparathyroidism of renal origin: Secondary | ICD-10-CM | POA: Diagnosis not present

## 2014-06-24 DIAGNOSIS — N186 End stage renal disease: Secondary | ICD-10-CM | POA: Diagnosis not present

## 2014-06-24 DIAGNOSIS — F411 Generalized anxiety disorder: Secondary | ICD-10-CM | POA: Diagnosis not present

## 2014-06-24 DIAGNOSIS — E1129 Type 2 diabetes mellitus with other diabetic kidney complication: Secondary | ICD-10-CM | POA: Diagnosis not present

## 2014-06-24 DIAGNOSIS — F29 Unspecified psychosis not due to a substance or known physiological condition: Secondary | ICD-10-CM | POA: Diagnosis not present

## 2014-07-09 DIAGNOSIS — F411 Generalized anxiety disorder: Secondary | ICD-10-CM | POA: Diagnosis not present

## 2014-07-09 DIAGNOSIS — F29 Unspecified psychosis not due to a substance or known physiological condition: Secondary | ICD-10-CM | POA: Diagnosis not present

## 2014-07-15 DIAGNOSIS — E1129 Type 2 diabetes mellitus with other diabetic kidney complication: Secondary | ICD-10-CM | POA: Diagnosis not present

## 2014-07-15 LAB — HEMOGLOBIN A1C: Hgb A1c MFr Bld: 5.3 % (ref 4.0–6.0)

## 2014-07-24 DIAGNOSIS — N186 End stage renal disease: Secondary | ICD-10-CM | POA: Diagnosis not present

## 2014-07-27 DIAGNOSIS — D631 Anemia in chronic kidney disease: Secondary | ICD-10-CM | POA: Diagnosis not present

## 2014-07-27 DIAGNOSIS — E1129 Type 2 diabetes mellitus with other diabetic kidney complication: Secondary | ICD-10-CM | POA: Diagnosis not present

## 2014-07-27 DIAGNOSIS — N186 End stage renal disease: Secondary | ICD-10-CM | POA: Diagnosis not present

## 2014-07-27 DIAGNOSIS — N039 Chronic nephritic syndrome with unspecified morphologic changes: Secondary | ICD-10-CM | POA: Diagnosis not present

## 2014-07-27 DIAGNOSIS — N2581 Secondary hyperparathyroidism of renal origin: Secondary | ICD-10-CM | POA: Diagnosis not present

## 2014-07-28 DIAGNOSIS — E119 Type 2 diabetes mellitus without complications: Secondary | ICD-10-CM | POA: Diagnosis not present

## 2014-07-28 DIAGNOSIS — H913 Deaf nonspeaking, not elsewhere classified: Secondary | ICD-10-CM | POA: Diagnosis not present

## 2014-07-28 DIAGNOSIS — E785 Hyperlipidemia, unspecified: Secondary | ICD-10-CM | POA: Diagnosis not present

## 2014-07-28 DIAGNOSIS — I10 Essential (primary) hypertension: Secondary | ICD-10-CM | POA: Diagnosis not present

## 2014-07-28 DIAGNOSIS — Z111 Encounter for screening for respiratory tuberculosis: Secondary | ICD-10-CM | POA: Diagnosis not present

## 2014-07-29 ENCOUNTER — Non-Acute Institutional Stay (SKILLED_NURSING_FACILITY): Payer: Medicare Other | Admitting: Internal Medicine

## 2014-07-29 DIAGNOSIS — D631 Anemia in chronic kidney disease: Secondary | ICD-10-CM

## 2014-07-29 DIAGNOSIS — E1129 Type 2 diabetes mellitus with other diabetic kidney complication: Secondary | ICD-10-CM | POA: Diagnosis not present

## 2014-07-29 DIAGNOSIS — N184 Chronic kidney disease, stage 4 (severe): Secondary | ICD-10-CM | POA: Diagnosis not present

## 2014-07-29 DIAGNOSIS — I509 Heart failure, unspecified: Secondary | ICD-10-CM

## 2014-07-29 DIAGNOSIS — N039 Chronic nephritic syndrome with unspecified morphologic changes: Secondary | ICD-10-CM

## 2014-07-29 DIAGNOSIS — I471 Supraventricular tachycardia, unspecified: Secondary | ICD-10-CM

## 2014-07-29 DIAGNOSIS — N2581 Secondary hyperparathyroidism of renal origin: Secondary | ICD-10-CM | POA: Diagnosis not present

## 2014-07-29 DIAGNOSIS — N189 Chronic kidney disease, unspecified: Secondary | ICD-10-CM | POA: Diagnosis not present

## 2014-07-29 DIAGNOSIS — I10 Essential (primary) hypertension: Secondary | ICD-10-CM | POA: Diagnosis not present

## 2014-07-29 DIAGNOSIS — I5032 Chronic diastolic (congestive) heart failure: Secondary | ICD-10-CM

## 2014-07-29 DIAGNOSIS — N186 End stage renal disease: Secondary | ICD-10-CM

## 2014-07-29 DIAGNOSIS — F411 Generalized anxiety disorder: Secondary | ICD-10-CM | POA: Diagnosis not present

## 2014-07-29 DIAGNOSIS — J9600 Acute respiratory failure, unspecified whether with hypoxia or hypercapnia: Secondary | ICD-10-CM | POA: Diagnosis not present

## 2014-07-29 DIAGNOSIS — F22 Delusional disorders: Secondary | ICD-10-CM

## 2014-07-29 DIAGNOSIS — E1122 Type 2 diabetes mellitus with diabetic chronic kidney disease: Secondary | ICD-10-CM

## 2014-07-29 DIAGNOSIS — E119 Type 2 diabetes mellitus without complications: Secondary | ICD-10-CM | POA: Diagnosis not present

## 2014-07-29 DIAGNOSIS — E785 Hyperlipidemia, unspecified: Secondary | ICD-10-CM | POA: Diagnosis not present

## 2014-07-29 DIAGNOSIS — F4320 Adjustment disorder, unspecified: Secondary | ICD-10-CM | POA: Diagnosis not present

## 2014-07-29 DIAGNOSIS — F29 Unspecified psychosis not due to a substance or known physiological condition: Secondary | ICD-10-CM | POA: Diagnosis not present

## 2014-07-29 DIAGNOSIS — IMO0001 Reserved for inherently not codable concepts without codable children: Secondary | ICD-10-CM | POA: Diagnosis not present

## 2014-07-29 DIAGNOSIS — I209 Angina pectoris, unspecified: Secondary | ICD-10-CM | POA: Diagnosis not present

## 2014-07-29 DIAGNOSIS — Z992 Dependence on renal dialysis: Secondary | ICD-10-CM | POA: Diagnosis not present

## 2014-07-29 DIAGNOSIS — R609 Edema, unspecified: Secondary | ICD-10-CM | POA: Diagnosis not present

## 2014-07-29 DIAGNOSIS — I498 Other specified cardiac arrhythmias: Secondary | ICD-10-CM | POA: Diagnosis not present

## 2014-07-29 DIAGNOSIS — D649 Anemia, unspecified: Secondary | ICD-10-CM | POA: Diagnosis not present

## 2014-07-29 DIAGNOSIS — H913 Deaf nonspeaking, not elsewhere classified: Secondary | ICD-10-CM | POA: Diagnosis not present

## 2014-07-29 DIAGNOSIS — M6250 Muscle wasting and atrophy, not elsewhere classified, unspecified site: Secondary | ICD-10-CM | POA: Diagnosis not present

## 2014-07-31 DIAGNOSIS — N039 Chronic nephritic syndrome with unspecified morphologic changes: Secondary | ICD-10-CM | POA: Diagnosis not present

## 2014-07-31 DIAGNOSIS — N186 End stage renal disease: Secondary | ICD-10-CM | POA: Diagnosis not present

## 2014-07-31 DIAGNOSIS — D631 Anemia in chronic kidney disease: Secondary | ICD-10-CM | POA: Diagnosis not present

## 2014-07-31 DIAGNOSIS — N2581 Secondary hyperparathyroidism of renal origin: Secondary | ICD-10-CM | POA: Diagnosis not present

## 2014-07-31 DIAGNOSIS — E1129 Type 2 diabetes mellitus with other diabetic kidney complication: Secondary | ICD-10-CM | POA: Diagnosis not present

## 2014-08-03 DIAGNOSIS — E1129 Type 2 diabetes mellitus with other diabetic kidney complication: Secondary | ICD-10-CM | POA: Diagnosis not present

## 2014-08-03 DIAGNOSIS — N2581 Secondary hyperparathyroidism of renal origin: Secondary | ICD-10-CM | POA: Diagnosis not present

## 2014-08-03 DIAGNOSIS — D631 Anemia in chronic kidney disease: Secondary | ICD-10-CM | POA: Diagnosis not present

## 2014-08-03 DIAGNOSIS — N186 End stage renal disease: Secondary | ICD-10-CM | POA: Diagnosis not present

## 2014-08-05 DIAGNOSIS — N2581 Secondary hyperparathyroidism of renal origin: Secondary | ICD-10-CM | POA: Diagnosis not present

## 2014-08-05 DIAGNOSIS — N186 End stage renal disease: Secondary | ICD-10-CM | POA: Diagnosis not present

## 2014-08-05 DIAGNOSIS — E1129 Type 2 diabetes mellitus with other diabetic kidney complication: Secondary | ICD-10-CM | POA: Diagnosis not present

## 2014-08-05 DIAGNOSIS — D631 Anemia in chronic kidney disease: Secondary | ICD-10-CM | POA: Diagnosis not present

## 2014-08-07 DIAGNOSIS — E1129 Type 2 diabetes mellitus with other diabetic kidney complication: Secondary | ICD-10-CM | POA: Diagnosis not present

## 2014-08-07 DIAGNOSIS — D631 Anemia in chronic kidney disease: Secondary | ICD-10-CM | POA: Diagnosis not present

## 2014-08-07 DIAGNOSIS — N186 End stage renal disease: Secondary | ICD-10-CM | POA: Diagnosis not present

## 2014-08-07 DIAGNOSIS — N2581 Secondary hyperparathyroidism of renal origin: Secondary | ICD-10-CM | POA: Diagnosis not present

## 2014-08-10 DIAGNOSIS — N186 End stage renal disease: Secondary | ICD-10-CM | POA: Diagnosis not present

## 2014-08-10 DIAGNOSIS — D631 Anemia in chronic kidney disease: Secondary | ICD-10-CM | POA: Diagnosis not present

## 2014-08-10 DIAGNOSIS — N039 Chronic nephritic syndrome with unspecified morphologic changes: Secondary | ICD-10-CM | POA: Diagnosis not present

## 2014-08-10 DIAGNOSIS — N2581 Secondary hyperparathyroidism of renal origin: Secondary | ICD-10-CM | POA: Diagnosis not present

## 2014-08-10 DIAGNOSIS — E1129 Type 2 diabetes mellitus with other diabetic kidney complication: Secondary | ICD-10-CM | POA: Diagnosis not present

## 2014-08-12 DIAGNOSIS — D631 Anemia in chronic kidney disease: Secondary | ICD-10-CM | POA: Diagnosis not present

## 2014-08-12 DIAGNOSIS — E1129 Type 2 diabetes mellitus with other diabetic kidney complication: Secondary | ICD-10-CM | POA: Diagnosis not present

## 2014-08-12 DIAGNOSIS — N2581 Secondary hyperparathyroidism of renal origin: Secondary | ICD-10-CM | POA: Diagnosis not present

## 2014-08-12 DIAGNOSIS — N186 End stage renal disease: Secondary | ICD-10-CM | POA: Diagnosis not present

## 2014-08-12 DIAGNOSIS — N039 Chronic nephritic syndrome with unspecified morphologic changes: Secondary | ICD-10-CM | POA: Diagnosis not present

## 2014-08-14 DIAGNOSIS — N2581 Secondary hyperparathyroidism of renal origin: Secondary | ICD-10-CM | POA: Diagnosis not present

## 2014-08-14 DIAGNOSIS — E1129 Type 2 diabetes mellitus with other diabetic kidney complication: Secondary | ICD-10-CM | POA: Diagnosis not present

## 2014-08-14 DIAGNOSIS — D631 Anemia in chronic kidney disease: Secondary | ICD-10-CM | POA: Diagnosis not present

## 2014-08-14 DIAGNOSIS — N186 End stage renal disease: Secondary | ICD-10-CM | POA: Diagnosis not present

## 2014-08-17 DIAGNOSIS — N2581 Secondary hyperparathyroidism of renal origin: Secondary | ICD-10-CM | POA: Diagnosis not present

## 2014-08-17 DIAGNOSIS — N186 End stage renal disease: Secondary | ICD-10-CM | POA: Diagnosis not present

## 2014-08-17 DIAGNOSIS — D631 Anemia in chronic kidney disease: Secondary | ICD-10-CM | POA: Diagnosis not present

## 2014-08-17 DIAGNOSIS — E1129 Type 2 diabetes mellitus with other diabetic kidney complication: Secondary | ICD-10-CM | POA: Diagnosis not present

## 2014-08-19 DIAGNOSIS — N039 Chronic nephritic syndrome with unspecified morphologic changes: Secondary | ICD-10-CM | POA: Diagnosis not present

## 2014-08-19 DIAGNOSIS — D631 Anemia in chronic kidney disease: Secondary | ICD-10-CM | POA: Diagnosis not present

## 2014-08-19 DIAGNOSIS — N186 End stage renal disease: Secondary | ICD-10-CM | POA: Diagnosis not present

## 2014-08-19 DIAGNOSIS — N2581 Secondary hyperparathyroidism of renal origin: Secondary | ICD-10-CM | POA: Diagnosis not present

## 2014-08-19 DIAGNOSIS — E1129 Type 2 diabetes mellitus with other diabetic kidney complication: Secondary | ICD-10-CM | POA: Diagnosis not present

## 2014-08-21 DIAGNOSIS — E1129 Type 2 diabetes mellitus with other diabetic kidney complication: Secondary | ICD-10-CM | POA: Diagnosis not present

## 2014-08-21 DIAGNOSIS — N2581 Secondary hyperparathyroidism of renal origin: Secondary | ICD-10-CM | POA: Diagnosis not present

## 2014-08-21 DIAGNOSIS — D631 Anemia in chronic kidney disease: Secondary | ICD-10-CM | POA: Diagnosis not present

## 2014-08-21 DIAGNOSIS — N186 End stage renal disease: Secondary | ICD-10-CM | POA: Diagnosis not present

## 2014-08-24 DIAGNOSIS — N2581 Secondary hyperparathyroidism of renal origin: Secondary | ICD-10-CM | POA: Diagnosis not present

## 2014-08-24 DIAGNOSIS — E1129 Type 2 diabetes mellitus with other diabetic kidney complication: Secondary | ICD-10-CM | POA: Diagnosis not present

## 2014-08-24 DIAGNOSIS — D631 Anemia in chronic kidney disease: Secondary | ICD-10-CM | POA: Diagnosis not present

## 2014-08-24 DIAGNOSIS — N186 End stage renal disease: Secondary | ICD-10-CM | POA: Diagnosis not present

## 2014-08-25 DIAGNOSIS — E785 Hyperlipidemia, unspecified: Secondary | ICD-10-CM | POA: Diagnosis not present

## 2014-08-25 DIAGNOSIS — R609 Edema, unspecified: Secondary | ICD-10-CM | POA: Diagnosis not present

## 2014-08-25 DIAGNOSIS — N184 Chronic kidney disease, stage 4 (severe): Secondary | ICD-10-CM | POA: Diagnosis not present

## 2014-08-25 DIAGNOSIS — N189 Chronic kidney disease, unspecified: Secondary | ICD-10-CM | POA: Diagnosis not present

## 2014-08-25 DIAGNOSIS — Z992 Dependence on renal dialysis: Secondary | ICD-10-CM | POA: Diagnosis not present

## 2014-08-25 DIAGNOSIS — J9600 Acute respiratory failure, unspecified whether with hypoxia or hypercapnia: Secondary | ICD-10-CM | POA: Diagnosis not present

## 2014-08-25 DIAGNOSIS — N186 End stage renal disease: Secondary | ICD-10-CM | POA: Diagnosis not present

## 2014-08-25 DIAGNOSIS — D649 Anemia, unspecified: Secondary | ICD-10-CM | POA: Diagnosis not present

## 2014-08-25 DIAGNOSIS — F411 Generalized anxiety disorder: Secondary | ICD-10-CM | POA: Diagnosis not present

## 2014-08-25 DIAGNOSIS — I209 Angina pectoris, unspecified: Secondary | ICD-10-CM | POA: Diagnosis not present

## 2014-08-25 DIAGNOSIS — F29 Unspecified psychosis not due to a substance or known physiological condition: Secondary | ICD-10-CM | POA: Diagnosis not present

## 2014-08-25 DIAGNOSIS — E119 Type 2 diabetes mellitus without complications: Secondary | ICD-10-CM | POA: Diagnosis not present

## 2014-08-25 DIAGNOSIS — D509 Iron deficiency anemia, unspecified: Secondary | ICD-10-CM | POA: Diagnosis not present

## 2014-08-25 DIAGNOSIS — M6250 Muscle wasting and atrophy, not elsewhere classified, unspecified site: Secondary | ICD-10-CM | POA: Diagnosis not present

## 2014-08-25 DIAGNOSIS — F4320 Adjustment disorder, unspecified: Secondary | ICD-10-CM | POA: Diagnosis not present

## 2014-08-25 DIAGNOSIS — E1129 Type 2 diabetes mellitus with other diabetic kidney complication: Secondary | ICD-10-CM | POA: Diagnosis not present

## 2014-08-25 DIAGNOSIS — H913 Deaf nonspeaking, not elsewhere classified: Secondary | ICD-10-CM | POA: Diagnosis not present

## 2014-08-25 DIAGNOSIS — N2581 Secondary hyperparathyroidism of renal origin: Secondary | ICD-10-CM | POA: Diagnosis not present

## 2014-08-25 DIAGNOSIS — D631 Anemia in chronic kidney disease: Secondary | ICD-10-CM | POA: Diagnosis not present

## 2014-08-25 DIAGNOSIS — I10 Essential (primary) hypertension: Secondary | ICD-10-CM | POA: Diagnosis not present

## 2014-08-25 DIAGNOSIS — I509 Heart failure, unspecified: Secondary | ICD-10-CM | POA: Diagnosis not present

## 2014-08-25 DIAGNOSIS — E8779 Other fluid overload: Secondary | ICD-10-CM | POA: Diagnosis not present

## 2014-08-25 DIAGNOSIS — IMO0001 Reserved for inherently not codable concepts without codable children: Secondary | ICD-10-CM | POA: Diagnosis not present

## 2014-09-02 LAB — CBC AND DIFFERENTIAL
HCT: 32 % — AB (ref 36–46)
Hemoglobin: 9.6 g/dL — AB (ref 12.0–16.0)
Platelets: 273 K/µL (ref 150–399)
WBC: 7.2 10*3/mL

## 2014-09-02 LAB — BASIC METABOLIC PANEL WITH GFR
BUN: 64 mg/dL — AB (ref 4–21)
Creatinine: 5.4 mg/dL — AB (ref 0.5–1.1)
Glucose: 116 mg/dL

## 2014-09-23 DIAGNOSIS — N186 End stage renal disease: Secondary | ICD-10-CM | POA: Diagnosis not present

## 2014-09-25 DIAGNOSIS — D631 Anemia in chronic kidney disease: Secondary | ICD-10-CM | POA: Diagnosis not present

## 2014-09-25 DIAGNOSIS — D509 Iron deficiency anemia, unspecified: Secondary | ICD-10-CM | POA: Diagnosis not present

## 2014-09-25 DIAGNOSIS — E1129 Type 2 diabetes mellitus with other diabetic kidney complication: Secondary | ICD-10-CM | POA: Diagnosis not present

## 2014-09-25 DIAGNOSIS — N186 End stage renal disease: Secondary | ICD-10-CM | POA: Diagnosis not present

## 2014-09-25 DIAGNOSIS — N2581 Secondary hyperparathyroidism of renal origin: Secondary | ICD-10-CM | POA: Diagnosis not present

## 2014-10-21 ENCOUNTER — Encounter: Payer: Self-pay | Admitting: Internal Medicine

## 2014-10-21 ENCOUNTER — Non-Acute Institutional Stay (SKILLED_NURSING_FACILITY): Payer: Medicare Other | Admitting: Internal Medicine

## 2014-10-21 DIAGNOSIS — E1129 Type 2 diabetes mellitus with other diabetic kidney complication: Secondary | ICD-10-CM | POA: Diagnosis not present

## 2014-10-21 DIAGNOSIS — F29 Unspecified psychosis not due to a substance or known physiological condition: Secondary | ICD-10-CM | POA: Diagnosis not present

## 2014-10-21 DIAGNOSIS — N186 End stage renal disease: Secondary | ICD-10-CM

## 2014-10-21 DIAGNOSIS — I10 Essential (primary) hypertension: Secondary | ICD-10-CM

## 2014-10-21 NOTE — Progress Notes (Signed)
Place of Service: [facility]  No Known Allergies  Code Status: Full Code Goals of Care: [facility]  Chief Complaint  Patient presents with  . Medical Management of Chronic Issues    HTN, ESRD, psychosis    HPI 58 y.o. female with PMH of deafness, blindness, mutism, HTN, ESRD on HD M/W/F is being seen for a routine visit. No falls or skin issues reported. Weight stable. No change in behavior or functional status. NO concerns from staff.   Review of Systems Unable to obtain   Past Medical History  Diagnosis Date  . Hyperlipidemia   . Hypertension     a. Has previously refused blood pressure meds.   . Deaf     Divorced from husband but lives with him. Has daughter but she does not care for her.  . Bilateral leg edema     a. Chronic.  Marland Kitchen Psychiatric disorder     She frequently exhibits paranoia and has been diagnosed with psychotic d/o NOS during hospital stay in the past. She is tangetial and perseverative during her visits. She apparently has had a bad experience with mental health in [place] in the past and refuses to discuss mental issues for fear that she will be sent back there. Her paranoia, communication issues, financial woes and lack of fam  . Fibroid uterus   . Anemia     Due to fibroids.  . Heart murmur   . Diabetes mellitus     a. Per PCP note 2012 (A1C 9.2) - pt unwilling to take meds and was educated on risk of uncontrolled DM.  b. A1C 5.9 in 11/2013.    Past Surgical History  Procedure Laterality Date  . No past surgeries    . Insertion of dialysis catheter Right 01/02/2014    Procedure: INSERTION OF DIALYSIS CATHETER;  Surgeon: Rosetta Posner, MD;  Location: [place];  Service: Vascular;  Laterality: Right;  . Av fistula placement Left 01/02/2014    Procedure: ARTERIOVENOUS (AV) FISTULA CREATION;  Surgeon: Rosetta Posner, MD;  Location: [place];  Service: Vascular;  Laterality: Left;    History   Social History  . Marital Status:  Married    Spouse Name: N/A    Number of Children: N/A  . Years of Education: N/A   Occupational History  . Not on file.   Social History Main Topics  . Smoking status: Never Smoker   . Smokeless tobacco: Not on file     Comment: Prior dip  . Alcohol Use: No  . Drug Use: No  . Sexual Activity: Not on file   Other Topics Concern  . Not on file   Social History Narrative  . No narrative on file      Medication List       This list is accurate as of: 10/21/14  7:29 PM.  Always use your most recent med list.               aspirin 81 MG tablet  Take 81 mg by mouth daily. For hypertension     atorvastatin 10 MG tablet  Commonly known as:  LIPITOR  Take 10 mg by mouth daily.     bisacodyl 10 MG suppository  Commonly known as:  DULCOLAX  Place 1 suppository (10 mg total) rectally daily as needed for mild constipation, moderate constipation or severe constipation.     calcium acetate 667 MG capsule  Commonly known as:  PHOSLO  Take  1 capsule (667 mg total) by mouth 3 (three) times daily with meals.     cloNIDine 0.2 mg/24hr patch  Commonly known as:  CATAPRES - Dosed in mg/24 hr  Place 0.2 mg onto the skin once a week. For hypertension     darbepoetin 100 MCG/0.5ML Soln injection  Commonly known as:  ARANESP  Inject 0.5 mLs (100 mcg total) into the skin every Tuesday at 6 PM.     DSS 100 MG Caps  Take 100 mg by mouth 2 (two) times daily. For constipation     hydrALAZINE 100 MG tablet  Commonly known as:  APRESOLINE  Take 1 tablet (100 mg total) by mouth every 8 (eight) hours.     isosorbide mononitrate 30 MG 24 hr tablet  Commonly known as:  IMDUR  Take 1 tablet (30 mg total) by mouth daily.     lactulose 10 GM/15ML solution  Commonly known as:  CHRONULAC  Take 30 mLs (20 g total) by mouth 2 (two) times daily as needed for moderate constipation.     levalbuterol 0.63 MG/3ML nebulizer solution  Commonly known as:  XOPENEX  Take 3 mLs (0.63 mg total) by  nebulization every 2 (two) hours as needed for wheezing or shortness of breath.     metoprolol 100 MG tablet  Commonly known as:  LOPRESSOR  Take 1 tablet (100 mg total) by mouth 2 (two) times daily.     multivitamin Tabs tablet  Take 1 tablet by mouth at bedtime.     naphazoline-pheniramine 0.025-0.3 % ophthalmic solution  Commonly known as:  NAPHCON-A  Place 1 drop into both eyes 4 (four) times daily as needed for irritation.     promethazine 12.5 MG suppository  Commonly known as:  PHENERGAN  Place 12.5 mg rectally every 4 (four) hours as needed for nausea or vomiting.     risperiDONE microspheres 25 MG injection  Commonly known as:  RISPERDAL CONSTA  Inject 25 mg into the muscle every 14 (fourteen) days. For psychosis        Physical Exam Filed Vitals:   10/21/14 1035  BP: 156/67  Pulse: 72  Temp: 98.1 F (36.7 C)  Resp: 20   Constitutional: WDWN adult  female in no acute distress.  HEENT: Normocephalic and atraumatic. PERRL. No icterus.  Neck: No lymphadenopathy, JVD or carotid bruits. Cardiac: Normal S1, S2. RRR without appreciable murmurs, rubs, or gallops. Distal pulses intact. Trace bilateral dependent edema.  Lungs: No respiratory distress. Breath sounds clear bilaterally without rales, rhonchi, or wheezes. Abdomen: Audible bowel sounds in all quadrants. Soft, nontender, nondistended. No palpable mass.  Musculoskeletal: Normal range of motion.   Skin: Warm and dry. No rash noted. No erythema.  Neurological: Alert  Psychiatric:  Appropriate mood and affect.   Labs Reviewed Lab Results  Component Value Date   HGBA1C 5.3 07/15/2014   CBC Latest Ref Rng 09/02/2014 01/08/2014 01/07/2014  WBC - 7.2 9.5 9.5  Hemoglobin 12.0 - 16.0 g/dL 9.6(A) 8.4(L) 6.9(LL)  Hematocrit 36 - 46 % 32(A) 25.7(L) 20.8(L)  Platelets 150 - 399 K/L 273 311 370    CMP     Component Value Date/Time   NA 136* 01/08/2014 0606   K 4.0 01/08/2014 0606   CL 97 01/08/2014 0606   CO2 27  01/08/2014 0606   GLUCOSE 132* 01/08/2014 0606   BUN 64* 09/02/2014   BUN 28* 01/08/2014 0606   CREATININE 5.4* 09/02/2014   CREATININE 2.89* 01/08/2014 0606   CREATININE  0.76 05/23/2011 0956   CALCIUM 7.9* 01/08/2014 0606   PROT 5.8* 12/24/2013 1315   ALBUMIN 1.6* 01/08/2014 0606   AST 25 12/24/2013 1315   ALT 22 12/24/2013 1315   ALKPHOS 99 12/24/2013 1315   BILITOT <0.2* 12/24/2013 1315   GFRNONAA 17* 01/08/2014 0606   GFRAA 20* 01/08/2014 0606     Assessment & Plan 1. End stage renal disease Stable. Tolerating HD on M/W/F. Continue to f/u with nephrology for renal needs.   2. Malignant hypertension Stable. Continue clonidine 0.2/24hr patch every 7 days, hydralazine 100mg  Q8H, Imdur 30mg  daily, and metoprolol 100mg  twice daily. Continue to monitor.   3. Psychosis, unspecified psychosis type Stable. Continue risperidone 25mg  IM injection every 14 days. Continue to monitor for change in behaviors.    Family/Staff Communication Plan of care discuss with professional staff members. Professional staff members verbalize understanding and agree with plan of care. No additional questions or concerns reported.    Arthur Holms, MSN, AGNP-C [facility] [address] [place], [place] [address] [phone] [8am-5pm] After hours: [phone]

## 2014-10-24 DIAGNOSIS — N186 End stage renal disease: Secondary | ICD-10-CM | POA: Diagnosis not present

## 2014-10-24 DIAGNOSIS — Z992 Dependence on renal dialysis: Secondary | ICD-10-CM | POA: Diagnosis not present

## 2014-10-26 ENCOUNTER — Encounter: Payer: Self-pay | Admitting: Internal Medicine

## 2014-10-26 DIAGNOSIS — D509 Iron deficiency anemia, unspecified: Secondary | ICD-10-CM | POA: Diagnosis not present

## 2014-10-26 DIAGNOSIS — N2581 Secondary hyperparathyroidism of renal origin: Secondary | ICD-10-CM | POA: Diagnosis not present

## 2014-10-26 DIAGNOSIS — N186 End stage renal disease: Secondary | ICD-10-CM | POA: Diagnosis not present

## 2014-10-26 DIAGNOSIS — E1129 Type 2 diabetes mellitus with other diabetic kidney complication: Secondary | ICD-10-CM | POA: Diagnosis not present

## 2014-10-26 DIAGNOSIS — D631 Anemia in chronic kidney disease: Secondary | ICD-10-CM | POA: Diagnosis not present

## 2014-10-28 DIAGNOSIS — N2581 Secondary hyperparathyroidism of renal origin: Secondary | ICD-10-CM | POA: Diagnosis not present

## 2014-10-28 DIAGNOSIS — N186 End stage renal disease: Secondary | ICD-10-CM | POA: Diagnosis not present

## 2014-10-28 DIAGNOSIS — M25532 Pain in left wrist: Secondary | ICD-10-CM | POA: Diagnosis not present

## 2014-10-28 DIAGNOSIS — R2232 Localized swelling, mass and lump, left upper limb: Secondary | ICD-10-CM | POA: Diagnosis not present

## 2014-10-28 DIAGNOSIS — D509 Iron deficiency anemia, unspecified: Secondary | ICD-10-CM | POA: Diagnosis not present

## 2014-10-28 DIAGNOSIS — D631 Anemia in chronic kidney disease: Secondary | ICD-10-CM | POA: Diagnosis not present

## 2014-10-28 DIAGNOSIS — M19042 Primary osteoarthritis, left hand: Secondary | ICD-10-CM | POA: Diagnosis not present

## 2014-10-28 DIAGNOSIS — E1129 Type 2 diabetes mellitus with other diabetic kidney complication: Secondary | ICD-10-CM | POA: Diagnosis not present

## 2014-10-30 DIAGNOSIS — N2581 Secondary hyperparathyroidism of renal origin: Secondary | ICD-10-CM | POA: Diagnosis not present

## 2014-10-30 DIAGNOSIS — D631 Anemia in chronic kidney disease: Secondary | ICD-10-CM | POA: Diagnosis not present

## 2014-10-30 DIAGNOSIS — E1129 Type 2 diabetes mellitus with other diabetic kidney complication: Secondary | ICD-10-CM | POA: Diagnosis not present

## 2014-10-30 DIAGNOSIS — N186 End stage renal disease: Secondary | ICD-10-CM | POA: Diagnosis not present

## 2014-10-30 DIAGNOSIS — D509 Iron deficiency anemia, unspecified: Secondary | ICD-10-CM | POA: Diagnosis not present

## 2014-11-02 DIAGNOSIS — N186 End stage renal disease: Secondary | ICD-10-CM | POA: Diagnosis not present

## 2014-11-02 DIAGNOSIS — D631 Anemia in chronic kidney disease: Secondary | ICD-10-CM | POA: Diagnosis not present

## 2014-11-02 DIAGNOSIS — D509 Iron deficiency anemia, unspecified: Secondary | ICD-10-CM | POA: Diagnosis not present

## 2014-11-02 DIAGNOSIS — E1129 Type 2 diabetes mellitus with other diabetic kidney complication: Secondary | ICD-10-CM | POA: Diagnosis not present

## 2014-11-02 DIAGNOSIS — N2581 Secondary hyperparathyroidism of renal origin: Secondary | ICD-10-CM | POA: Diagnosis not present

## 2014-11-03 DIAGNOSIS — E11359 Type 2 diabetes mellitus with proliferative diabetic retinopathy without macular edema: Secondary | ICD-10-CM | POA: Diagnosis not present

## 2014-11-03 DIAGNOSIS — H3342 Traction detachment of retina, left eye: Secondary | ICD-10-CM | POA: Diagnosis not present

## 2014-11-03 DIAGNOSIS — H913 Deaf nonspeaking, not elsewhere classified: Secondary | ICD-10-CM | POA: Diagnosis not present

## 2014-11-03 DIAGNOSIS — Z9889 Other specified postprocedural states: Secondary | ICD-10-CM | POA: Diagnosis not present

## 2014-11-04 DIAGNOSIS — E1129 Type 2 diabetes mellitus with other diabetic kidney complication: Secondary | ICD-10-CM | POA: Diagnosis not present

## 2014-11-04 DIAGNOSIS — D509 Iron deficiency anemia, unspecified: Secondary | ICD-10-CM | POA: Diagnosis not present

## 2014-11-04 DIAGNOSIS — D631 Anemia in chronic kidney disease: Secondary | ICD-10-CM | POA: Diagnosis not present

## 2014-11-04 DIAGNOSIS — N186 End stage renal disease: Secondary | ICD-10-CM | POA: Diagnosis not present

## 2014-11-04 DIAGNOSIS — N2581 Secondary hyperparathyroidism of renal origin: Secondary | ICD-10-CM | POA: Diagnosis not present

## 2014-11-05 DIAGNOSIS — N186 End stage renal disease: Secondary | ICD-10-CM | POA: Diagnosis not present

## 2014-11-05 DIAGNOSIS — I871 Compression of vein: Secondary | ICD-10-CM | POA: Diagnosis not present

## 2014-11-05 DIAGNOSIS — Z992 Dependence on renal dialysis: Secondary | ICD-10-CM | POA: Diagnosis not present

## 2014-11-05 DIAGNOSIS — T82858A Stenosis of vascular prosthetic devices, implants and grafts, initial encounter: Secondary | ICD-10-CM | POA: Diagnosis not present

## 2014-11-06 DIAGNOSIS — N2581 Secondary hyperparathyroidism of renal origin: Secondary | ICD-10-CM | POA: Diagnosis not present

## 2014-11-06 DIAGNOSIS — D631 Anemia in chronic kidney disease: Secondary | ICD-10-CM | POA: Diagnosis not present

## 2014-11-06 DIAGNOSIS — N186 End stage renal disease: Secondary | ICD-10-CM | POA: Diagnosis not present

## 2014-11-06 DIAGNOSIS — D509 Iron deficiency anemia, unspecified: Secondary | ICD-10-CM | POA: Diagnosis not present

## 2014-11-06 DIAGNOSIS — E1129 Type 2 diabetes mellitus with other diabetic kidney complication: Secondary | ICD-10-CM | POA: Diagnosis not present

## 2014-11-09 DIAGNOSIS — D509 Iron deficiency anemia, unspecified: Secondary | ICD-10-CM | POA: Diagnosis not present

## 2014-11-09 DIAGNOSIS — E1129 Type 2 diabetes mellitus with other diabetic kidney complication: Secondary | ICD-10-CM | POA: Diagnosis not present

## 2014-11-09 DIAGNOSIS — N186 End stage renal disease: Secondary | ICD-10-CM | POA: Diagnosis not present

## 2014-11-09 DIAGNOSIS — D631 Anemia in chronic kidney disease: Secondary | ICD-10-CM | POA: Diagnosis not present

## 2014-11-09 DIAGNOSIS — R062 Wheezing: Secondary | ICD-10-CM | POA: Diagnosis not present

## 2014-11-09 DIAGNOSIS — N2581 Secondary hyperparathyroidism of renal origin: Secondary | ICD-10-CM | POA: Diagnosis not present

## 2014-11-10 ENCOUNTER — Non-Acute Institutional Stay (SKILLED_NURSING_FACILITY): Payer: Medicare Other | Admitting: Internal Medicine

## 2014-11-10 DIAGNOSIS — R0989 Other specified symptoms and signs involving the circulatory and respiratory systems: Secondary | ICD-10-CM | POA: Diagnosis not present

## 2014-11-10 NOTE — Progress Notes (Signed)
MRN: [id number] Name: [name]  Sex: female Age: 58 y.o. DOB: 1956/03/12  [place] #: Karren Burly Facility/Room:232A Level Of Care: SNF Provider: Inocencio Homes D Emergency Contacts: Extended Emergency Contact Information Primary Emergency Contact: [place] Mobile Phone: [phone] Relation: Daughter    Allergies: Review of patient's allergies indicates no known allergies.  Chief Complaint  Patient presents with  . Acute Visit    HPI: Patient is 58 y.o. female who nursing asked me to see pt  for CXR ordered yesterday.  Past Medical History  Diagnosis Date  . Hyperlipidemia   . Hypertension     a. Has previously refused blood pressure meds.   . Deaf     Divorced from husband but lives with him. Has daughter but she does not care for her.  . Bilateral leg edema     a. Chronic.  Marland Kitchen Psychiatric disorder     She frequently exhibits paranoia and has been diagnosed with psychotic d/o NOS during hospital stay in the past. She is tangetial and perseverative during her visits. She apparently has had a bad experience with mental health in [place] in the past and refuses to discuss mental issues for fear that she will be sent back there. Her paranoia, communication issues, financial woes and lack of fam  . Fibroid uterus   . Anemia     Due to fibroids.  . Heart murmur   . Diabetes mellitus     a. Per PCP note 2012 (A1C 9.2) - pt unwilling to take meds and was educated on risk of uncontrolled DM.  b. A1C 5.9 in 11/2013.    Past Surgical History  Procedure Laterality Date  . No past surgeries    . Insertion of dialysis catheter Right 01/02/2014    Procedure: INSERTION OF DIALYSIS CATHETER;  Surgeon: Rosetta Posner, MD;  Location: [place];  Service: Vascular;  Laterality: Right;  . Av fistula placement Left 01/02/2014    Procedure: ARTERIOVENOUS (AV) FISTULA CREATION;  Surgeon: Rosetta Posner, MD;  Location: [facility];  Service: Vascular;  Laterality: Left;       Medication List       This list is accurate as of: 11/10/14 11:59 PM.  Always use your most recent med list.               aspirin 81 MG tablet  Take 81 mg by mouth daily. For hypertension     atorvastatin 10 MG tablet  Commonly known as:  LIPITOR  Take 10 mg by mouth daily.     bisacodyl 10 MG suppository  Commonly known as:  DULCOLAX  Place 1 suppository (10 mg total) rectally daily as needed for mild constipation, moderate constipation or severe constipation.     calcium acetate 667 MG capsule  Commonly known as:  PHOSLO  Take 1 capsule (667 mg total) by mouth 3 (three) times daily with meals.     cloNIDine 0.2 mg/24hr patch  Commonly known as:  CATAPRES - Dosed in mg/24 hr  Place 0.2 mg onto the skin once a week. For hypertension     darbepoetin 100 MCG/0.5ML Soln injection  Commonly known as:  ARANESP  Inject 0.5 mLs (100 mcg total) into the skin every Tuesday at 6 PM.     DSS 100 MG Caps  Take 100 mg by mouth 2 (two) times daily. For constipation     hydrALAZINE 100 MG tablet  Commonly known as:  APRESOLINE  Take 1  tablet (100 mg total) by mouth every 8 (eight) hours.     isosorbide mononitrate 30 MG 24 hr tablet  Commonly known as:  IMDUR  Take 1 tablet (30 mg total) by mouth daily.     lactulose 10 GM/15ML solution  Commonly known as:  CHRONULAC  Take 30 mLs (20 g total) by mouth 2 (two) times daily as needed for moderate constipation.     levalbuterol 0.63 MG/3ML nebulizer solution  Commonly known as:  XOPENEX  Take 3 mLs (0.63 mg total) by nebulization every 2 (two) hours as needed for wheezing or shortness of breath.     metoprolol 100 MG tablet  Commonly known as:  LOPRESSOR  Take 1 tablet (100 mg total) by mouth 2 (two) times daily.     multivitamin Tabs tablet  Take 1 tablet by mouth at bedtime.     naphazoline-pheniramine 0.025-0.3 % ophthalmic solution  Commonly known as:  NAPHCON-A  Place 1 drop into both eyes 4 (four) times daily as  needed for irritation.     promethazine 12.5 MG suppository  Commonly known as:  PHENERGAN  Place 12.5 mg rectally every 4 (four) hours as needed for nausea or vomiting.     risperiDONE microspheres 25 MG injection  Commonly known as:  RISPERDAL CONSTA  Inject 25 mg into the muscle every 14 (fourteen) days. For psychosis        No orders of the defined types were placed in this encounter.    Immunization History  Administered Date(s) Administered  . Influenza,inj,Quad PF,36+ Mos 01/03/2014  . PPD Test 07/28/2014  . Pneumococcal Polysaccharide-23 01/03/2014    History  Substance Use Topics  . Smoking status: Never Smoker   . Smokeless tobacco: Not on file     Comment: Prior dip  . Alcohol Use: No    Review of Systems  DATA OBTAINED: from nurse; daughter heard pt cough yesterday and demanded CXR GENERAL:  no fevers, fatigue, appetite changes SKIN: No itching, rash HEENT: No complaint RESPIRATORY: No cough, wheezing, SOB per nursing CARDIAC: No chest pain, palpitations, lower extremity edema  GI: No abdominal pain, No N/V/D or constipation, No heartburn or reflux  GU: No dysuria, frequency or urgency, or incontinence  MUSCULOSKELETAL: No unrelieved bone/joint pain NEUROLOGIC: No headache, dizziness  PSYCHIATRIC: No overt anxiety or sadness  Filed Vitals:   11/10/14 1829  BP: 129/73  Pulse: 88  Temp: 98.6 F (37 C)  Resp: 20    Physical Exam  GENERAL APPEARANCE: Alert, conversant, No acute distress  SKIN: No diaphoresis rash HEENT: Unremarkable RESPIRATORY: Breathing is even, unlabored. Lung sounds are clear;pt did not cough even once during interview  CARDIOVASCULAR: Heart RRR no murmurs, rubs or gallops. No peripheral edema  GASTROINTESTINAL: Abdomen is soft, non-tender, not distended w/ normal bowel sounds.  GENITOURINARY: Bladder non tender, not distended  MUSCULOSKELETAL: No abnormal joints or musculature NEUROLOGIC: Cranial nerves 2-12 grossly  intact PSYCHIATRIC: Mood and affect appropriate to situation, no behavioral issues  Patient Active Problem List   Diagnosis Date Noted  . End stage renal disease 02/10/2014  . Acute renal failure 12/24/2013  . SVT (supraventricular tachycardia) 12/24/2013  . NSVT (nonsustained ventricular tachycardia) 12/24/2013  . Abnormal echocardiogram 12/24/2013  . Chronic diastolic CHF (congestive heart failure) 12/24/2013  . Anemia 12/24/2013  . Constipation 12/24/2013  . 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 Acute respiratory failure with hypoxia 12/22/2013  . Community acquired pneumonia 12/22/2013  . HYPERLIPIDEMIA 02/28/2010  .  LEG EDEMA 01/19/2010  . DEAF MUTISM 12/03/2009  . FIBROIDS, UTERUS 08/09/2009  . PSYCHIATRIC DISORDER 08/09/2009  . HYPERTENSION, ESSENTIAL, UNCONTROLLED 08/09/2009  . POSTMENOPAUSAL STATUS 08/09/2009    CBC    Component Value Date/Time   WBC 7.2 09/02/2014   WBC 9.5 01/08/2014 0600   RBC 2.79* 01/08/2014 0600   HGB 9.6* 09/02/2014   HCT 32* 09/02/2014   PLT 273 09/02/2014   MCV 92.1 01/08/2014 0600   LYMPHSABS 3.4 12/25/2013 0553   MONOABS 0.5 12/25/2013 0553   EOSABS 0.1 12/25/2013 0553   BASOSABS 0.0 12/25/2013 0553    CMP     Component Value Date/Time   NA 136* 01/08/2014 0606   K 4.0 01/08/2014 0606   CL 97 01/08/2014 0606   CO2 27 01/08/2014 0606   GLUCOSE 132* 01/08/2014 0606   BUN 64* 09/02/2014   BUN 28* 01/08/2014 0606   CREATININE 5.4* 09/02/2014   CREATININE 2.89* 01/08/2014 0606   CREATININE 0.76 05/23/2011 0956   CALCIUM 7.9* 01/08/2014 0606   PROT 5.8* 12/24/2013 1315   ALBUMIN 1.6* 01/08/2014 0606   AST 25 12/24/2013 1315   ALT 22 12/24/2013 1315   ALKPHOS 99 12/24/2013 1315   BILITOT <0.2* 12/24/2013 1315   GFRNONAA 17* 01/08/2014 0606   GFRAA 20* 01/08/2014 0606    Assessment and Plan  INTERVIEW FOR POSSIBLE INFECTION -  per staff pt not coughing and did not cough during the interview; no abn lung sounds or  wheezing, O2 sat 99%; CXR was read as mild hazyness at lung base "might " represent an early infiltrate; clinically pt does not present as PNA and problems with antibiotic resisitance demands not using an antibiotic just in case but will continue to monitor signs and sx  Hennie Duos, MD

## 2014-11-11 DIAGNOSIS — N186 End stage renal disease: Secondary | ICD-10-CM | POA: Diagnosis not present

## 2014-11-11 DIAGNOSIS — D509 Iron deficiency anemia, unspecified: Secondary | ICD-10-CM | POA: Diagnosis not present

## 2014-11-11 DIAGNOSIS — D631 Anemia in chronic kidney disease: Secondary | ICD-10-CM | POA: Diagnosis not present

## 2014-11-11 DIAGNOSIS — N2581 Secondary hyperparathyroidism of renal origin: Secondary | ICD-10-CM | POA: Diagnosis not present

## 2014-11-11 DIAGNOSIS — E1129 Type 2 diabetes mellitus with other diabetic kidney complication: Secondary | ICD-10-CM | POA: Diagnosis not present

## 2014-11-13 DIAGNOSIS — N2581 Secondary hyperparathyroidism of renal origin: Secondary | ICD-10-CM | POA: Diagnosis not present

## 2014-11-13 DIAGNOSIS — D509 Iron deficiency anemia, unspecified: Secondary | ICD-10-CM | POA: Diagnosis not present

## 2014-11-13 DIAGNOSIS — N186 End stage renal disease: Secondary | ICD-10-CM | POA: Diagnosis not present

## 2014-11-13 DIAGNOSIS — D631 Anemia in chronic kidney disease: Secondary | ICD-10-CM | POA: Diagnosis not present

## 2014-11-13 DIAGNOSIS — E1129 Type 2 diabetes mellitus with other diabetic kidney complication: Secondary | ICD-10-CM | POA: Diagnosis not present

## 2014-11-13 DIAGNOSIS — Z23 Encounter for immunization: Secondary | ICD-10-CM | POA: Diagnosis not present

## 2014-11-15 ENCOUNTER — Encounter: Payer: Self-pay | Admitting: Internal Medicine

## 2014-11-15 DIAGNOSIS — E1129 Type 2 diabetes mellitus with other diabetic kidney complication: Secondary | ICD-10-CM | POA: Diagnosis not present

## 2014-11-15 DIAGNOSIS — N2581 Secondary hyperparathyroidism of renal origin: Secondary | ICD-10-CM | POA: Diagnosis not present

## 2014-11-15 DIAGNOSIS — D509 Iron deficiency anemia, unspecified: Secondary | ICD-10-CM | POA: Diagnosis not present

## 2014-11-15 DIAGNOSIS — D631 Anemia in chronic kidney disease: Secondary | ICD-10-CM | POA: Diagnosis not present

## 2014-11-15 DIAGNOSIS — N186 End stage renal disease: Secondary | ICD-10-CM | POA: Diagnosis not present

## 2014-11-17 DIAGNOSIS — E1129 Type 2 diabetes mellitus with other diabetic kidney complication: Secondary | ICD-10-CM | POA: Diagnosis not present

## 2014-11-17 DIAGNOSIS — D509 Iron deficiency anemia, unspecified: Secondary | ICD-10-CM | POA: Diagnosis not present

## 2014-11-17 DIAGNOSIS — D631 Anemia in chronic kidney disease: Secondary | ICD-10-CM | POA: Diagnosis not present

## 2014-11-17 DIAGNOSIS — N186 End stage renal disease: Secondary | ICD-10-CM | POA: Diagnosis not present

## 2014-11-17 DIAGNOSIS — N2581 Secondary hyperparathyroidism of renal origin: Secondary | ICD-10-CM | POA: Diagnosis not present

## 2014-11-19 ENCOUNTER — Inpatient Hospital Stay (HOSPITAL_COMMUNITY)
Admission: EM | Admit: 2014-11-19 | Discharge: 2014-11-26 | DRG: 280 | Disposition: A | Discharge status: Skilled Nursing Facility | Payer: Medicare Other | Attending: Internal Medicine | Admitting: Internal Medicine

## 2014-11-19 ENCOUNTER — Encounter (HOSPITAL_COMMUNITY): Payer: Self-pay | Admitting: Emergency Medicine

## 2014-11-19 DIAGNOSIS — I110 Hypertensive heart disease with heart failure: Secondary | ICD-10-CM | POA: Diagnosis present

## 2014-11-19 DIAGNOSIS — F29 Unspecified psychosis not due to a substance or known physiological condition: Secondary | ICD-10-CM | POA: Diagnosis not present

## 2014-11-19 DIAGNOSIS — F489 Nonpsychotic mental disorder, unspecified: Secondary | ICD-10-CM | POA: Diagnosis present

## 2014-11-19 DIAGNOSIS — F419 Anxiety disorder, unspecified: Secondary | ICD-10-CM | POA: Diagnosis present

## 2014-11-19 DIAGNOSIS — D631 Anemia in chronic kidney disease: Secondary | ICD-10-CM | POA: Diagnosis present

## 2014-11-19 DIAGNOSIS — H913 Deaf nonspeaking, not elsewhere classified: Secondary | ICD-10-CM | POA: Diagnosis present

## 2014-11-19 DIAGNOSIS — H548 Legal blindness, as defined in USA: Secondary | ICD-10-CM | POA: Diagnosis present

## 2014-11-19 DIAGNOSIS — I213 ST elevation (STEMI) myocardial infarction of unspecified site: Secondary | ICD-10-CM | POA: Diagnosis present

## 2014-11-19 DIAGNOSIS — E785 Hyperlipidemia, unspecified: Secondary | ICD-10-CM | POA: Diagnosis present

## 2014-11-19 DIAGNOSIS — R7989 Other specified abnormal findings of blood chemistry: Secondary | ICD-10-CM | POA: Diagnosis not present

## 2014-11-19 DIAGNOSIS — I10 Essential (primary) hypertension: Secondary | ICD-10-CM

## 2014-11-19 DIAGNOSIS — E875 Hyperkalemia: Secondary | ICD-10-CM

## 2014-11-19 DIAGNOSIS — I120 Hypertensive chronic kidney disease with stage 5 chronic kidney disease or end stage renal disease: Secondary | ICD-10-CM | POA: Diagnosis present

## 2014-11-19 DIAGNOSIS — N189 Chronic kidney disease, unspecified: Secondary | ICD-10-CM | POA: Diagnosis not present

## 2014-11-19 DIAGNOSIS — Z992 Dependence on renal dialysis: Secondary | ICD-10-CM

## 2014-11-19 DIAGNOSIS — I5043 Acute on chronic combined systolic (congestive) and diastolic (congestive) heart failure: Secondary | ICD-10-CM | POA: Diagnosis present

## 2014-11-19 DIAGNOSIS — R609 Edema, unspecified: Secondary | ICD-10-CM

## 2014-11-19 DIAGNOSIS — E669 Obesity, unspecified: Secondary | ICD-10-CM | POA: Diagnosis present

## 2014-11-19 DIAGNOSIS — J80 Acute respiratory distress syndrome: Secondary | ICD-10-CM | POA: Diagnosis not present

## 2014-11-19 DIAGNOSIS — H547 Unspecified visual loss: Secondary | ICD-10-CM | POA: Diagnosis present

## 2014-11-19 DIAGNOSIS — I369 Nonrheumatic tricuspid valve disorder, unspecified: Secondary | ICD-10-CM | POA: Diagnosis not present

## 2014-11-19 DIAGNOSIS — I509 Heart failure, unspecified: Secondary | ICD-10-CM

## 2014-11-19 DIAGNOSIS — N186 End stage renal disease: Secondary | ICD-10-CM | POA: Diagnosis present

## 2014-11-19 DIAGNOSIS — I471 Supraventricular tachycardia, unspecified: Secondary | ICD-10-CM

## 2014-11-19 DIAGNOSIS — F22 Delusional disorders: Secondary | ICD-10-CM | POA: Diagnosis present

## 2014-11-19 DIAGNOSIS — F99 Mental disorder, not otherwise specified: Secondary | ICD-10-CM | POA: Diagnosis present

## 2014-11-19 DIAGNOSIS — I2109 ST elevation (STEMI) myocardial infarction involving other coronary artery of anterior wall: Principal | ICD-10-CM

## 2014-11-19 DIAGNOSIS — H9190 Unspecified hearing loss, unspecified ear: Secondary | ICD-10-CM

## 2014-11-19 DIAGNOSIS — R740 Nonspecific elevation of levels of transaminase and lactic acid dehydrogenase [LDH]: Secondary | ICD-10-CM | POA: Diagnosis present

## 2014-11-19 DIAGNOSIS — Z7982 Long term (current) use of aspirin: Secondary | ICD-10-CM

## 2014-11-19 DIAGNOSIS — R0602 Shortness of breath: Secondary | ICD-10-CM | POA: Diagnosis not present

## 2014-11-19 DIAGNOSIS — D649 Anemia, unspecified: Secondary | ICD-10-CM | POA: Diagnosis not present

## 2014-11-19 DIAGNOSIS — H540 Blindness, both eyes: Secondary | ICD-10-CM | POA: Diagnosis not present

## 2014-11-19 DIAGNOSIS — I517 Cardiomegaly: Secondary | ICD-10-CM | POA: Diagnosis not present

## 2014-11-19 DIAGNOSIS — R931 Abnormal findings on diagnostic imaging of heart and coronary circulation: Secondary | ICD-10-CM

## 2014-11-19 DIAGNOSIS — J9601 Acute respiratory failure with hypoxia: Secondary | ICD-10-CM | POA: Diagnosis present

## 2014-11-19 DIAGNOSIS — I214 Non-ST elevation (NSTEMI) myocardial infarction: Secondary | ICD-10-CM

## 2014-11-19 DIAGNOSIS — J189 Pneumonia, unspecified organism: Secondary | ICD-10-CM

## 2014-11-19 DIAGNOSIS — I5032 Chronic diastolic (congestive) heart failure: Secondary | ICD-10-CM

## 2014-11-19 DIAGNOSIS — E1122 Type 2 diabetes mellitus with diabetic chronic kidney disease: Secondary | ICD-10-CM | POA: Diagnosis not present

## 2014-11-19 DIAGNOSIS — Z6830 Body mass index (BMI) 30.0-30.9, adult: Secondary | ICD-10-CM

## 2014-11-19 DIAGNOSIS — J9600 Acute respiratory failure, unspecified whether with hypoxia or hypercapnia: Secondary | ICD-10-CM | POA: Diagnosis not present

## 2014-11-19 DIAGNOSIS — F4320 Adjustment disorder, unspecified: Secondary | ICD-10-CM | POA: Diagnosis not present

## 2014-11-19 DIAGNOSIS — K7200 Acute and subacute hepatic failure without coma: Secondary | ICD-10-CM | POA: Diagnosis not present

## 2014-11-19 DIAGNOSIS — I4729 Other ventricular tachycardia: Secondary | ICD-10-CM

## 2014-11-19 DIAGNOSIS — J182 Hypostatic pneumonia, unspecified organism: Secondary | ICD-10-CM | POA: Diagnosis not present

## 2014-11-19 DIAGNOSIS — J811 Chronic pulmonary edema: Secondary | ICD-10-CM | POA: Diagnosis not present

## 2014-11-19 DIAGNOSIS — I472 Ventricular tachycardia: Secondary | ICD-10-CM

## 2014-11-19 DIAGNOSIS — J90 Pleural effusion, not elsewhere classified: Secondary | ICD-10-CM | POA: Diagnosis not present

## 2014-11-19 DIAGNOSIS — I255 Ischemic cardiomyopathy: Secondary | ICD-10-CM | POA: Diagnosis present

## 2014-11-19 LAB — CBC
HCT: 29.1 % — ABNORMAL LOW (ref 36.0–46.0)
Hemoglobin: 9.6 g/dL — ABNORMAL LOW (ref 12.0–15.0)
MCH: 30.9 pg (ref 26.0–34.0)
MCHC: 33 g/dL (ref 30.0–36.0)
MCV: 93.6 fL (ref 78.0–100.0)
Platelets: 272 10*3/uL (ref 150–400)
RBC: 3.11 MIL/uL — ABNORMAL LOW (ref 3.87–5.11)
RDW: 13 % (ref 11.5–15.5)
WBC: 10.1 10*3/uL (ref 4.0–10.5)

## 2014-11-19 LAB — I-STAT TROPONIN, ED: Troponin i, poc: 0.7 ng/mL (ref 0.00–0.08)

## 2014-11-19 MED ORDER — ASPIRIN 81 MG PO CHEW
324.0000 mg | CHEWABLE_TABLET | Freq: Once | ORAL | Status: DC
  Filled 2014-11-19: qty 4

## 2014-11-19 NOTE — ED Notes (Signed)
Spoke with son [name] regarding patient being at [facility]. Son stated he was on his way

## 2014-11-19 NOTE — ED Notes (Signed)
Per ems, called to nursing home for patient in respiratory distress with chest pain.  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  Pt received duoneb and cpap pta. Currently calm and does not appear to be in respiratory distress

## 2014-11-19 NOTE — ED Provider Notes (Signed)
CSN: [id number]     Arrival date & time 11/19/14  2321 History  This chart was scribed for Evelina Bucy, MD by Evelene Croon, ED Scribe. This patient was seen in room A06C/A06C and the patient's care was started 11:13 PM.    Chief Complaint  Patient presents with  . Respiratory Distress     Patient is a 58 y.o. female presenting with shortness of breath. The history is provided by the EMS personnel and the spouse.  Shortness of Breath Severity:  Moderate Onset quality:  Unable to specify Timing:  Constant Progression:  Improving Chronicity:  Recurrent Associated symptoms: chest pain and wheezing   Chest pain:    Quality:  Pressure   HPI Comments:  [name] is a 58 y.o. female with a h/o CHF brought in by ambulance from [facility], who presents to the Emergency Department on CPAP for moderate-severe trouble breathing that started a few hours PTA. Per EMS upon their arrival to the pt she had audible wheezing with labored breathing. Pt is deaf and blind, EMS hx obtained form pt's husband. He reported chest pressure, trouble breathing and stated that pt was having trouble getting comfortable for bed. Per EMS breathing has improved. She also received an albuterol tx at home PTA.    Past Medical History  Diagnosis Date  . Hyperlipidemia   . Hypertension     a. Has previously refused blood pressure meds.   . Deaf     Divorced from husband but lives with him. Has daughter but she does not care for her.  . Bilateral leg edema     a. Chronic.  Marland Kitchen Psychiatric disorder     She frequently exhibits paranoia and has been diagnosed with psychotic d/o NOS during hospital stay in the past. She is tangetial and perseverative during her visits. She apparently has had a bad experience with mental health in [place] in the past and refuses to discuss mental issues for fear that she will be sent back there. Her paranoia, communication issues, financial woes and lack of fam  . Fibroid uterus   .  Anemia     Due to fibroids.  . Heart murmur   . Diabetes mellitus     a. Per PCP note 2012 (A1C 9.2) - pt unwilling to take meds and was educated on risk of uncontrolled DM.  b. A1C 5.9 in 11/2013.   Past Surgical History  Procedure Laterality Date  . No past surgeries    . Insertion of dialysis catheter Right 01/02/2014    Procedure: INSERTION OF DIALYSIS CATHETER;  Surgeon: Rosetta Posner, MD;  Location: [place];  Service: Vascular;  Laterality: Right;  . Av fistula placement Left 01/02/2014    Procedure: ARTERIOVENOUS (AV) FISTULA CREATION;  Surgeon: Rosetta Posner, MD;  Location: [facility] OR;  Service: Vascular;  Laterality: Left;   Family History  Problem Relation Age of Onset  . Other Father     Drowned from fishing?  . Heart disease     History  Substance Use Topics  . Smoking status: Never Smoker   . Smokeless tobacco: Not on file     Comment: Prior dip  . Alcohol Use: No   OB History    Gravida Para Term Preterm AB TAB SAB Ectopic Multiple Living   1 1        1      Review of Systems  Unable to perform ROS: Patient nonverbal  Respiratory: Positive for shortness of  breath and wheezing.   Cardiovascular: Positive for chest pain.      Allergies  Review of patient's allergies indicates no known allergies.  Home Medications   Prior to Admission medications   Medication Sig Start Date End Date Taking? Authorizing Provider  aspirin 81 MG tablet Take 81 mg by mouth daily. For hypertension    Historical Provider, MD  atorvastatin (LIPITOR) 10 MG tablet Take 10 mg by mouth daily.    Historical Provider, MD  bisacodyl (DULCOLAX) 10 MG suppository Place 1 suppository (10 mg total) rectally daily as needed for mild constipation, moderate constipation or severe constipation. 01/08/14   Kelvin Cellar, MD  calcium acetate (PHOSLO) 667 MG capsule Take 1 capsule (667 mg total) by mouth 3 (three) times daily with meals. 01/08/14   Kelvin Cellar, MD  cloNIDine (CATAPRES - DOSED IN MG/24 HR)  0.2 mg/24hr patch Place 0.2 mg onto the skin once a week. For hypertension    Historical Provider, MD  darbepoetin (ARANESP) 100 MCG/0.5ML SOLN injection Inject 0.5 mLs (100 mcg total) into the skin every Tuesday at 6 PM. 01/08/14   Kelvin Cellar, MD  Docusate Sodium (DSS) 100 MG CAPS Take 100 mg by mouth 2 (two) times daily. For constipation 01/08/14   Kelvin Cellar, MD  hydrALAZINE (APRESOLINE) 100 MG tablet Take 1 tablet (100 mg total) by mouth every 8 (eight) hours. 01/08/14   Kelvin Cellar, MD  isosorbide mononitrate (IMDUR) 30 MG 24 hr tablet Take 1 tablet (30 mg total) by mouth daily. 01/08/14   Kelvin Cellar, MD  lactulose (CHRONULAC) 10 GM/15ML solution Take 30 mLs (20 g total) by mouth 2 (two) times daily as needed for moderate constipation. 01/08/14   Kelvin Cellar, MD  levalbuterol (XOPENEX) 0.63 MG/3ML nebulizer solution Take 3 mLs (0.63 mg total) by nebulization every 2 (two) hours as needed for wheezing or shortness of breath. 01/08/14   Kelvin Cellar, MD  metoprolol (LOPRESSOR) 100 MG tablet Take 1 tablet (100 mg total) by mouth 2 (two) times daily. 01/08/14   Kelvin Cellar, MD  multivitamin (RENA-VIT) TABS tablet Take 1 tablet by mouth at bedtime. 01/08/14   Kelvin Cellar, MD  naphazoline-pheniramine (NAPHCON-A) 0.025-0.3 % ophthalmic solution Place 1 drop into both eyes 4 (four) times daily as needed for irritation. 01/08/14   Kelvin Cellar, MD  promethazine (PHENERGAN) 12.5 MG suppository Place 12.5 mg rectally every 4 (four) hours as needed for nausea or vomiting.    Historical Provider, MD  risperiDONE microspheres (RISPERDAL CONSTA) 25 MG injection Inject 25 mg into the muscle every 14 (fourteen) days. For psychosis    Historical Provider, MD   There were no vitals taken for this visit. Physical Exam  Constitutional: She is oriented to person, place, and time. She appears well-developed and well-nourished. No distress.  HENT:  Head: Normocephalic and atraumatic.   Mouth/Throat: Oropharynx is clear and moist. No oropharyngeal exudate.  Eyes: Conjunctivae and EOM are normal. Pupils are equal, round, and reactive to light.  Neck: Normal range of motion. Neck supple. No JVD present.  No meningismus.  Cardiovascular: Normal rate, regular rhythm, normal heart sounds and intact distal pulses.   No murmur heard. Pulmonary/Chest: Effort normal. No respiratory distress. She has wheezes (expiratory, R sided, occasional). She has rales (R basilar, L basilar).  Abdominal: Soft. There is no tenderness. There is no rebound and no guarding.  Musculoskeletal: Normal range of motion. She exhibits no edema or tenderness.  Neurological: She is alert and oriented to person, place,  and time. No cranial nerve deficit. She exhibits normal muscle tone. Coordination normal.  No ataxia on finger to nose bilaterally. No pronator drift. 5/5 strength throughout. CN 2-12 intact. Negative Romberg. Equal grip strength. Sensation intact. Gait is normal.   Skin: Skin is warm.  Psychiatric: She has a normal mood and affect. Her behavior is normal.  Nursing note and vitals reviewed.   ED Course  Procedures   DIAGNOSTIC STUDIES:  Oxygen Saturation is 99% on CPAP, normal by my interpretation.    COORDINATION OF CARE:    Labs Review Labs Reviewed  CBC - Abnormal; Notable for the following:    RBC 3.11 (*)    Hemoglobin 9.6 (*)    HCT 29.1 (*)    All other components within normal limits  COMPREHENSIVE METABOLIC PANEL - Abnormal; Notable for the following:    Sodium 128 (*)    Potassium 6.7 (*)    Chloride 89 (*)    Glucose, Bld 260 (*)    BUN 83 (*)    Creatinine, Ser 6.65 (*)    Total Protein 8.6 (*)    AST 60 (*)    ALT 68 (*)    Alkaline Phosphatase 142 (*)    GFR calc non Af Amer 6 (*)    GFR calc Af Amer 7 (*)    Anion gap 18 (*)    All other components within normal limits  I-STAT TROPOININ, ED - Abnormal; Notable for the following:    Troponin i, poc 0.70  (*)    All other components within normal limits  PRO B NATRIURETIC PEPTIDE  PROTIME-INR    Imaging Review Dg Chest 2 View  11/20/2014   CLINICAL DATA:  Respiratory distress and chest pain.  EXAM: CHEST  2 VIEW  COMPARISON:  01/02/2014.  FINDINGS: Mild cardiac enlargement. Small bilateral pleural effusions and mild to moderate interstitial edema is identified. No focal airspace consolidation identified. Spondylosis noted within the thoracic spine.  IMPRESSION: 1. Mild to moderate CHF.   Electronically Signed   By: Kerby Moors M.D.   On: 11/20/2014 00:34     EKG Interpretation   Date/Time:  Thursday November 19 2014 23:58:42 EST Ventricular Rate:  78 PR Interval:  156 QRS Duration: 95 QT Interval:  414 QTC Calculation: 472 R Axis:   14 Text Interpretation:  Sinus rhythm Probable left atrial enlargement  Anterolateral infarct, old Lateral T wave inversions, Biphasic V2 waves -  New Confirmed by [facility]  MD, Kodie Pick [phone]) on 11/20/2014 12:01:43 AM      CRITICAL CARE Performed by: Osvaldo Shipper   Total critical care time: 45 minutes  Critical care time was exclusive of separately billable procedures and treating other patients.  Critical care was necessary to treat or prevent imminent or life-threatening deterioration.  Critical care was time spent personally by me on the following activities: development of treatment plan with patient and/or surrogate as well as nursing, discussions with consultants, evaluation of patient's response to treatment, examination of patient, obtaining history from patient or surrogate, ordering and performing treatments and interventions, ordering and review of laboratory studies, ordering and review of radiographic studies, pulse oximetry and re-evaluation of patient's condition.   MDM   Final diagnoses:  Shortness of breath  NSTEMI (non-ST elevated myocardial infarction)  CHF exacerbation  Hyperkalemia    58 year old female history  of end-stage renal disease, CHF with also deafness and blindness presents with shortness of breath. Began today. His hypertensive at the nursing home.  Was placed on CPAP by EMS and also given albuterol. Great improvement in her shortness of breath since then. Husband reported also some chest pain to the nursing home staff. Husband helps interpret for the patient. Here well-appearing, vitals are stable. She has scattered right-sided wheezing at the end of expiration. She also has bilateral basilar rales. Of note she does not have COPD or asthma. She had a chest x-ray in her chart from the 19th of this month showing possible right-sided lower lung pneumonia. Do not need to continue BiPAP visit this time. Concern for possible pneumonia versus CHF exacerbation today. Will obtain labs, chest x-ray. Troponin elevated, EKG shows some ischemic changes. Will put on heparin. I spoke with Dr. Claiborne Billings with Cards, he will evaluate her. 1225 am - Potassium returned elevated at 6.7 Moderate hyperkalemia protocol initiated. 1250 - I spoke with Dr. Moshe Cipro of [facility] about possible dialysis. Since she tolerated BiPap well with EMS, will place patient back on BiPap. Dr. Moshe Cipro states she doesn't need immediate dialysis but can get seen in the morning.    Plan for admission to medicine.  I personally performed the services described in this documentation, which was scribed in my presence. The recorded information has been reviewed and is accurate.      Evelina Bucy, MD 11/20/14 [phone]

## 2014-11-20 ENCOUNTER — Emergency Department (HOSPITAL_COMMUNITY): Payer: Medicare Other

## 2014-11-20 DIAGNOSIS — J9600 Acute respiratory failure, unspecified whether with hypoxia or hypercapnia: Secondary | ICD-10-CM | POA: Insufficient documentation

## 2014-11-20 DIAGNOSIS — I255 Ischemic cardiomyopathy: Secondary | ICD-10-CM | POA: Diagnosis not present

## 2014-11-20 DIAGNOSIS — I517 Cardiomegaly: Secondary | ICD-10-CM | POA: Diagnosis not present

## 2014-11-20 DIAGNOSIS — J9601 Acute respiratory failure with hypoxia: Secondary | ICD-10-CM | POA: Diagnosis not present

## 2014-11-20 DIAGNOSIS — Z6830 Body mass index (BMI) 30.0-30.9, adult: Secondary | ICD-10-CM | POA: Diagnosis not present

## 2014-11-20 DIAGNOSIS — I5043 Acute on chronic combined systolic (congestive) and diastolic (congestive) heart failure: Secondary | ICD-10-CM | POA: Diagnosis present

## 2014-11-20 DIAGNOSIS — D631 Anemia in chronic kidney disease: Secondary | ICD-10-CM | POA: Diagnosis present

## 2014-11-20 DIAGNOSIS — I2109 ST elevation (STEMI) myocardial infarction involving other coronary artery of anterior wall: Secondary | ICD-10-CM | POA: Diagnosis not present

## 2014-11-20 DIAGNOSIS — J811 Chronic pulmonary edema: Secondary | ICD-10-CM | POA: Diagnosis not present

## 2014-11-20 DIAGNOSIS — I120 Hypertensive chronic kidney disease with stage 5 chronic kidney disease or end stage renal disease: Secondary | ICD-10-CM | POA: Diagnosis not present

## 2014-11-20 DIAGNOSIS — F4320 Adjustment disorder, unspecified: Secondary | ICD-10-CM | POA: Diagnosis not present

## 2014-11-20 DIAGNOSIS — J80 Acute respiratory distress syndrome: Secondary | ICD-10-CM | POA: Diagnosis not present

## 2014-11-20 DIAGNOSIS — F22 Delusional disorders: Secondary | ICD-10-CM | POA: Diagnosis present

## 2014-11-20 DIAGNOSIS — F29 Unspecified psychosis not due to a substance or known physiological condition: Secondary | ICD-10-CM | POA: Diagnosis not present

## 2014-11-20 DIAGNOSIS — E669 Obesity, unspecified: Secondary | ICD-10-CM | POA: Diagnosis present

## 2014-11-20 DIAGNOSIS — N186 End stage renal disease: Secondary | ICD-10-CM

## 2014-11-20 DIAGNOSIS — E785 Hyperlipidemia, unspecified: Secondary | ICD-10-CM | POA: Diagnosis not present

## 2014-11-20 DIAGNOSIS — D649 Anemia, unspecified: Secondary | ICD-10-CM | POA: Diagnosis not present

## 2014-11-20 DIAGNOSIS — F419 Anxiety disorder, unspecified: Secondary | ICD-10-CM | POA: Diagnosis not present

## 2014-11-20 DIAGNOSIS — I369 Nonrheumatic tricuspid valve disorder, unspecified: Secondary | ICD-10-CM | POA: Diagnosis not present

## 2014-11-20 DIAGNOSIS — R740 Nonspecific elevation of levels of transaminase and lactic acid dehydrogenase [LDH]: Secondary | ICD-10-CM | POA: Diagnosis present

## 2014-11-20 DIAGNOSIS — I509 Heart failure, unspecified: Secondary | ICD-10-CM | POA: Insufficient documentation

## 2014-11-20 DIAGNOSIS — H913 Deaf nonspeaking, not elsewhere classified: Secondary | ICD-10-CM | POA: Diagnosis not present

## 2014-11-20 DIAGNOSIS — R0602 Shortness of breath: Secondary | ICD-10-CM | POA: Diagnosis not present

## 2014-11-20 DIAGNOSIS — Z992 Dependence on renal dialysis: Secondary | ICD-10-CM | POA: Diagnosis not present

## 2014-11-20 DIAGNOSIS — J182 Hypostatic pneumonia, unspecified organism: Secondary | ICD-10-CM | POA: Diagnosis not present

## 2014-11-20 DIAGNOSIS — R7989 Other specified abnormal findings of blood chemistry: Secondary | ICD-10-CM

## 2014-11-20 DIAGNOSIS — J90 Pleural effusion, not elsewhere classified: Secondary | ICD-10-CM | POA: Diagnosis not present

## 2014-11-20 DIAGNOSIS — E875 Hyperkalemia: Secondary | ICD-10-CM | POA: Diagnosis not present

## 2014-11-20 DIAGNOSIS — I10 Essential (primary) hypertension: Secondary | ICD-10-CM

## 2014-11-20 DIAGNOSIS — Z7982 Long term (current) use of aspirin: Secondary | ICD-10-CM | POA: Diagnosis not present

## 2014-11-20 DIAGNOSIS — E1122 Type 2 diabetes mellitus with diabetic chronic kidney disease: Secondary | ICD-10-CM | POA: Diagnosis not present

## 2014-11-20 DIAGNOSIS — I214 Non-ST elevation (NSTEMI) myocardial infarction: Secondary | ICD-10-CM | POA: Insufficient documentation

## 2014-11-20 DIAGNOSIS — N189 Chronic kidney disease, unspecified: Secondary | ICD-10-CM | POA: Diagnosis not present

## 2014-11-20 DIAGNOSIS — K7200 Acute and subacute hepatic failure without coma: Secondary | ICD-10-CM | POA: Diagnosis not present

## 2014-11-20 DIAGNOSIS — N184 Chronic kidney disease, stage 4 (severe): Secondary | ICD-10-CM

## 2014-11-20 DIAGNOSIS — F99 Mental disorder, not otherwise specified: Secondary | ICD-10-CM | POA: Diagnosis present

## 2014-11-20 DIAGNOSIS — H540 Blindness, both eyes: Secondary | ICD-10-CM | POA: Diagnosis not present

## 2014-11-20 DIAGNOSIS — H548 Legal blindness, as defined in USA: Secondary | ICD-10-CM | POA: Diagnosis present

## 2014-11-20 LAB — CBC
HCT: 29.9 % — ABNORMAL LOW (ref 36.0–46.0)
Hemoglobin: 9.6 g/dL — ABNORMAL LOW (ref 12.0–15.0)
MCH: 30.4 pg (ref 26.0–34.0)
MCHC: 32.1 g/dL (ref 30.0–36.0)
MCV: 94.6 fL (ref 78.0–100.0)
Platelets: 235 10*3/uL (ref 150–400)
RBC: 3.16 MIL/uL — ABNORMAL LOW (ref 3.87–5.11)
RDW: 13.3 % (ref 11.5–15.5)
WBC: 9.1 10*3/uL (ref 4.0–10.5)

## 2014-11-20 LAB — GLUCOSE, CAPILLARY
Glucose-Capillary: 120 mg/dL — ABNORMAL HIGH (ref 70–99)
Glucose-Capillary: 113 mg/dL — ABNORMAL HIGH (ref 70–99)
Glucose-Capillary: 137 mg/dL — ABNORMAL HIGH (ref 70–99)

## 2014-11-20 LAB — COMPREHENSIVE METABOLIC PANEL
ALT: 68 U/L — ABNORMAL HIGH (ref 0–35)
AST: 60 U/L — ABNORMAL HIGH (ref 0–37)
Albumin: 3.5 g/dL (ref 3.5–5.2)
Alkaline Phosphatase: 142 U/L — ABNORMAL HIGH (ref 39–117)
Anion gap: 18 — ABNORMAL HIGH (ref 5–15)
BUN: 83 mg/dL — ABNORMAL HIGH (ref 6–23)
CO2: 21 mEq/L (ref 19–32)
Calcium: 9 mg/dL (ref 8.4–10.5)
Chloride: 89 mEq/L — ABNORMAL LOW (ref 96–112)
Creatinine, Ser: 6.65 mg/dL — ABNORMAL HIGH (ref 0.50–1.10)
GFR calc Af Amer: 7 mL/min — ABNORMAL LOW (ref >90)
GFR calc non Af Amer: 6 mL/min — ABNORMAL LOW (ref >90)
Glucose, Bld: 260 mg/dL — ABNORMAL HIGH (ref 70–99)
Potassium: 6.7 mEq/L (ref 3.7–5.3)
Sodium: 128 mEq/L — ABNORMAL LOW (ref 137–147)
Total Bilirubin: 0.3 mg/dL (ref 0.3–1.2)
Total Protein: 8.6 g/dL — ABNORMAL HIGH (ref 6.0–8.3)

## 2014-11-20 LAB — TROPONIN I
Troponin I: 0.42 ng/mL (ref <0.30)
Troponin I: 1.63 ng/mL (ref <0.30)
Troponin I: 1.3 ng/mL (ref <0.30)

## 2014-11-20 LAB — HEPATITIS B SURFACE ANTIGEN: Hepatitis B Surface Ag: NEGATIVE

## 2014-11-20 LAB — PRO B NATRIURETIC PEPTIDE: Pro B Natriuretic peptide (BNP): 19448 pg/mL — ABNORMAL HIGH (ref 0–125)

## 2014-11-20 LAB — HEPARIN LEVEL (UNFRACTIONATED)
Heparin Unfractionated: 0.1 IU/mL — ABNORMAL LOW (ref 0.30–0.70)
Heparin Unfractionated: 0.24 IU/mL — ABNORMAL LOW (ref 0.30–0.70)

## 2014-11-20 LAB — PROTIME-INR
INR: 1.04 (ref 0.00–1.49)
Prothrombin Time: 13.7 seconds (ref 11.6–15.2)

## 2014-11-20 LAB — MRSA PCR SCREENING: MRSA by PCR: NEGATIVE

## 2014-11-20 LAB — POTASSIUM: Potassium: 4.3 mEq/L (ref 3.7–5.3)

## 2014-11-20 MED ORDER — HEPARIN BOLUS VIA INFUSION
4000.0000 [IU] | Freq: Once | INTRAVENOUS | Status: AC
  Administered 2014-11-20: 4000 [IU] via INTRAVENOUS
  Filled 2014-11-20: qty 4000

## 2014-11-20 MED ORDER — SODIUM BICARBONATE 8.4 % IV SOLN
50.0000 meq | Freq: Once | INTRAVENOUS | Status: AC
  Administered 2014-11-20: 50 meq via INTRAVENOUS
  Filled 2014-11-20: qty 50

## 2014-11-20 MED ORDER — LACTULOSE 10 GM/15ML PO SOLN
20.0000 g | Freq: Two times a day (BID) | ORAL | Status: DC | PRN
  Filled 2014-11-20: qty 30

## 2014-11-20 MED ORDER — BISACODYL 10 MG RE SUPP: 10.0000 mg | Freq: Every day | RECTAL | Status: DC | PRN

## 2014-11-20 MED ORDER — PENTAFLUOROPROP-TETRAFLUOROETH EX AERO: 1.0000 "application " | INHALATION_SPRAY | CUTANEOUS | Status: DC | PRN

## 2014-11-20 MED ORDER — CLONIDINE HCL 0.2 MG/24HR TD PTWK
0.2000 mg | MEDICATED_PATCH | TRANSDERMAL | Status: DC
  Administered 2014-11-24: 0.2 mg via TRANSDERMAL
  Filled 2014-11-20 (×2): qty 1

## 2014-11-20 MED ORDER — HEPARIN SODIUM (PORCINE) 1000 UNIT/ML DIALYSIS: 20.0000 [IU]/kg | INTRAMUSCULAR | Status: DC | PRN

## 2014-11-20 MED ORDER — INSULIN ASPART 100 UNIT/ML IV SOLN
5.0000 [IU] | Freq: Once | INTRAVENOUS | Status: AC
  Administered 2014-11-20: 5 [IU] via INTRAVENOUS
  Filled 2014-11-20: qty 1

## 2014-11-20 MED ORDER — DOCUSATE SODIUM 100 MG PO CAPS
100.0000 mg | ORAL_CAPSULE | Freq: Two times a day (BID) | ORAL | Status: DC
  Administered 2014-11-20 – 2014-11-26 (×10): 100 mg via ORAL
  Filled 2014-11-20 (×14): qty 1

## 2014-11-20 MED ORDER — SODIUM CHLORIDE 0.9 % IV SOLN: 100.0000 mL | INTRAVENOUS | Status: DC | PRN

## 2014-11-20 MED ORDER — RISPERIDONE MICROSPHERES 25 MG IM SUSR: 25.0000 mg | INTRAMUSCULAR | Status: DC

## 2014-11-20 MED ORDER — INSULIN ASPART 100 UNIT/ML ~~LOC~~ SOLN
0.0000 [IU] | Freq: Three times a day (TID) | SUBCUTANEOUS | Status: DC
  Administered 2014-11-21 – 2014-11-22 (×4): 1 [IU] via SUBCUTANEOUS
  Administered 2014-11-24 – 2014-11-25 (×2): 2 [IU] via SUBCUTANEOUS
  Administered 2014-11-25: 1 [IU] via SUBCUTANEOUS

## 2014-11-20 MED ORDER — PROMETHAZINE HCL 25 MG RE SUPP: 12.5000 mg | RECTAL | Status: DC | PRN

## 2014-11-20 MED ORDER — RENA-VITE PO TABS: 1.0000 | ORAL_TABLET | Freq: Every day | ORAL | Status: DC

## 2014-11-20 MED ORDER — ATORVASTATIN CALCIUM 80 MG PO TABS
80.0000 mg | ORAL_TABLET | Freq: Every day | ORAL | Status: DC
  Administered 2014-11-20 – 2014-11-25 (×6): 80 mg via ORAL
  Filled 2014-11-20 (×8): qty 1

## 2014-11-20 MED ORDER — SODIUM CHLORIDE 0.9 % IV SOLN
250.0000 mL | INTRAVENOUS | Status: DC | PRN
Start: 2014-11-20 — End: 2014-11-20

## 2014-11-20 MED ORDER — NAPHAZOLINE-PHENIRAMINE 0.025-0.3 % OP SOLN
1.0000 [drp] | Freq: Four times a day (QID) | OPHTHALMIC | Status: DC | PRN
  Filled 2014-11-20: qty 15

## 2014-11-20 MED ORDER — SODIUM POLYSTYRENE SULFONATE 15 GM/60ML PO SUSP
30.0000 g | Freq: Once | ORAL | Status: DC
  Filled 2014-11-20: qty 120

## 2014-11-20 MED ORDER — HEPARIN (PORCINE) IN NACL 100-0.45 UNIT/ML-% IJ SOLN
1400.0000 [IU]/h | INTRAMUSCULAR | Status: DC
  Administered 2014-11-20: 1000 [IU]/h via INTRAVENOUS
  Administered 2014-11-21: 1300 [IU]/h via INTRAVENOUS
  Administered 2014-11-21: 1000 [IU]/h via INTRAVENOUS
  Administered 2014-11-21: 2000 [IU]/h via INTRAVENOUS
  Administered 2014-11-22: 1400 [IU]/h via INTRAVENOUS
  Filled 2014-11-20 (×7): qty 250

## 2014-11-20 MED ORDER — LORAZEPAM 2 MG/ML IJ SOLN
0.5000 mg | Freq: Once | INTRAMUSCULAR | Status: AC
  Administered 2014-11-20: 0.5 mg via INTRAVENOUS
  Filled 2014-11-20: qty 1

## 2014-11-20 MED ORDER — ASPIRIN 81 MG PO CHEW
81.0000 mg | CHEWABLE_TABLET | Freq: Every day | ORAL | Status: DC
  Administered 2014-11-20 – 2014-11-26 (×6): 81 mg via ORAL
  Filled 2014-11-20 (×6): qty 1

## 2014-11-20 MED ORDER — DOXERCALCIFEROL 0.5 MCG PO CAPS
1.0000 ug | ORAL_CAPSULE | ORAL | Status: DC
  Administered 2014-11-23: 1 ug via ORAL
  Filled 2014-11-20 (×3): qty 2

## 2014-11-20 MED ORDER — SODIUM CHLORIDE 0.9 % IJ SOLN: 3.0000 mL | INTRAMUSCULAR | Status: DC | PRN

## 2014-11-20 MED ORDER — LIDOCAINE HCL (PF) 1 % IJ SOLN: 5.0000 mL | INTRAMUSCULAR | Status: DC | PRN

## 2014-11-20 MED ORDER — METOPROLOL TARTRATE 12.5 MG HALF TABLET
12.5000 mg | ORAL_TABLET | Freq: Two times a day (BID) | ORAL | Status: DC
  Administered 2014-11-20 – 2014-11-21 (×4): 12.5 mg via ORAL
  Filled 2014-11-20 (×6): qty 1

## 2014-11-20 MED ORDER — DEXTROSE 50 % IV SOLN
1.0000 | Freq: Once | INTRAVENOUS | Status: AC
  Administered 2014-11-20: 50 mL via INTRAVENOUS
  Filled 2014-11-20: qty 50

## 2014-11-20 MED ORDER — ISOSORBIDE MONONITRATE ER 30 MG PO TB24
30.0000 mg | ORAL_TABLET | Freq: Every day | ORAL | Status: DC
  Administered 2014-11-20 – 2014-11-23 (×4): 30 mg via ORAL
  Filled 2014-11-20 (×4): qty 1

## 2014-11-20 MED ORDER — DARBEPOETIN ALFA 40 MCG/0.4ML IJ SOSY
40.0000 ug | PREFILLED_SYRINGE | INTRAMUSCULAR | Status: DC
  Administered 2014-11-23: 40 ug via INTRAVENOUS
  Filled 2014-11-20: qty 0.4

## 2014-11-20 MED ORDER — SODIUM CHLORIDE 0.9 % IV SOLN
125.0000 mg | INTRAVENOUS | Status: DC
  Administered 2014-11-23: 125 mg via INTRAVENOUS
  Filled 2014-11-20 (×2): qty 10

## 2014-11-20 MED ORDER — LEVALBUTEROL HCL 0.63 MG/3ML IN NEBU
0.6300 mg | INHALATION_SOLUTION | RESPIRATORY_TRACT | Status: DC | PRN
  Administered 2014-11-21: 0.63 mg via RESPIRATORY_TRACT
  Filled 2014-11-20: qty 3

## 2014-11-20 MED ORDER — SODIUM CHLORIDE 0.9 % IJ SOLN
3.0000 mL | Freq: Two times a day (BID) | INTRAMUSCULAR | Status: DC
  Administered 2014-11-20: 3 mL via INTRAVENOUS

## 2014-11-20 MED ORDER — DEXTROSE 5 % IV SOLN
120.0000 mg | Freq: Once | INTRAVENOUS | Status: DC
  Filled 2014-11-20: qty 12

## 2014-11-20 MED ORDER — ALTEPLASE 2 MG IJ SOLR: 2.0000 mg | Freq: Once | INTRAMUSCULAR | Status: DC | PRN

## 2014-11-20 MED ORDER — HYDRALAZINE HCL 50 MG PO TABS
100.0000 mg | ORAL_TABLET | Freq: Three times a day (TID) | ORAL | Status: DC
  Administered 2014-11-20 – 2014-11-26 (×16): 100 mg via ORAL
  Filled 2014-11-20 (×22): qty 2

## 2014-11-20 MED ORDER — HEPARIN SODIUM (PORCINE) 1000 UNIT/ML DIALYSIS: 1000.0000 [IU] | INTRAMUSCULAR | Status: DC | PRN

## 2014-11-20 MED ORDER — MORPHINE SULFATE 2 MG/ML IJ SOLN
2.0000 mg | INTRAMUSCULAR | Status: DC | PRN
  Administered 2014-11-21 – 2014-11-22 (×2): 2 mg via INTRAVENOUS
  Filled 2014-11-20 (×3): qty 1

## 2014-11-20 MED ORDER — RENA-VITE PO TABS
1.0000 | ORAL_TABLET | Freq: Every day | ORAL | Status: DC
  Administered 2014-11-20 – 2014-11-25 (×6): 1 via ORAL
  Filled 2014-11-20 (×8): qty 1

## 2014-11-20 MED ORDER — CALCIUM ACETATE 667 MG PO CAPS
667.0000 mg | ORAL_CAPSULE | Freq: Three times a day (TID) | ORAL | Status: DC
  Administered 2014-11-20 – 2014-11-26 (×14): 667 mg via ORAL
  Filled 2014-11-20 (×21): qty 1

## 2014-11-20 MED ORDER — HEPARIN (PORCINE) IN NACL 100-0.45 UNIT/ML-% IJ SOLN
800.0000 [IU]/h | INTRAMUSCULAR | Status: DC
  Administered 2014-11-20: 800 [IU]/h via INTRAVENOUS
  Filled 2014-11-20: qty 250

## 2014-11-20 MED ORDER — NITROGLYCERIN 0.4 MG SL SUBL: 0.4000 mg | SUBLINGUAL_TABLET | SUBLINGUAL | Status: DC | PRN

## 2014-11-20 MED ORDER — LIDOCAINE-PRILOCAINE 2.5-2.5 % EX CREA: 1.0000 "application " | TOPICAL_CREAM | CUTANEOUS | Status: DC | PRN

## 2014-11-20 MED ORDER — NEPRO/CARBSTEADY PO LIQD
237.0000 mL | ORAL | Status: DC | PRN
  Filled 2014-11-20: qty 237

## 2014-11-20 MED ORDER — ONDANSETRON HCL 4 MG/2ML IJ SOLN
4.0000 mg | Freq: Once | INTRAMUSCULAR | Status: AC
  Administered 2014-11-20: 4 mg via INTRAVENOUS
  Filled 2014-11-20: qty 2

## 2014-11-20 MED ORDER — ALBUTEROL SULFATE (2.5 MG/3ML) 0.083% IN NEBU
10.0000 mg | INHALATION_SOLUTION | Freq: Once | RESPIRATORY_TRACT | Status: AC
  Administered 2014-11-20: 10 mg via RESPIRATORY_TRACT
  Filled 2014-11-20: qty 12

## 2014-11-20 NOTE — Consult Note (Signed)
Reason for Consult: elevated troponin  Referring Physician: Dr. Mingo Amber Primary Cardiologist: seen by Dr. Percival Spanish as inpatient earlier in the year  [name] is an 58 y.o. female.  HPI: [name] is a 58 yo woman with PMH of deafness, blindness, hypertension, dyslipidemia, T2DM, ESRD on hemodialysis Monday/Wednesday/Friday who lives at a nursing home and presents with shortness of breath. I evaluated [name] with the Internal Medicine Physician in the presence of her son and sign language translator. [name] has been having increased shortness of breath for over a week with some chest pain several days ago. Today, she's largely been much more short of breath with perhaps some chest pain earlier today but no current chest pain but continued dyspnea. She is on BIPAP with some variable tolerance currently. Her potassium was noted to be 6.7 and she was treated with insulin, lasix 120 mg IV, kayexalate, sodium bicarbonate. I added on some low dose ativan for anxiety and difficulty with the BIPAP mask. She was also started on heparin gtt. Her chest x-ray is consistently with pulmonary edema. Renal has also been consulted for urgent dialysis. Initial ECG notable for inferior and anterolateral MI, both age indeterminate.    Past Medical History  Diagnosis Date  . Hyperlipidemia   . Hypertension     a. Has previously refused blood pressure meds.   . Deaf     Divorced from husband but lives with him. Has daughter but she does not care for her.  . Bilateral leg edema     a. Chronic.  Marland Kitchen Psychiatric disorder     She frequently exhibits paranoia and has been diagnosed with psychotic d/o NOS during hospital stay in the past. She is tangetial and perseverative during her visits. She apparently has had a bad experience with mental health in [place] in the past and refuses to discuss mental issues for fear that she will be sent back there. Her paranoia, communication issues, financial woes and lack of  fam  . Fibroid uterus   . Anemia     Due to fibroids.  . Heart murmur   . Diabetes mellitus     a. Per PCP note 2012 (A1C 9.2) - pt unwilling to take meds and was educated on risk of uncontrolled DM.  b. A1C 5.9 in 11/2013.    Past Surgical History  Procedure Laterality Date  . No past surgeries    . Insertion of dialysis catheter Right 01/02/2014    Procedure: INSERTION OF DIALYSIS CATHETER;  Surgeon: Rosetta Posner, MD;  Location: [facility];  Service: Vascular;  Laterality: Right;  . Av fistula placement Left 01/02/2014    Procedure: ARTERIOVENOUS (AV) FISTULA CREATION;  Surgeon: Rosetta Posner, MD;  Location: [facility];  Service: Vascular;  Laterality: Left;    Family History  Problem Relation Age of Onset  . Other Father     Drowned from fishing?  . Heart disease      Social History:  reports that she has never smoked. She does not have any smokeless tobacco history on file. She reports that she does not drink alcohol or use illicit drugs.  Allergies: No Known Allergies  Medications:  I have reviewed the patient's current medications. Prior to Admission:  (Not in a hospital admission) Scheduled: . aspirin  324 mg Oral Once  . sodium polystyrene  30 g Oral Once    Results for orders placed or performed during the hospital encounter of 11/19/14 (from the past  48 hour(s))  CBC     Status: Abnormal   Collection Time: 11/19/14 11:35 PM  Result Value Ref Range   WBC 10.1 4.0 - 10.5 K/uL   RBC 3.11 (L) 3.87 - 5.11 MIL/uL   Hemoglobin 9.6 (L) 12.0 - 15.0 g/dL   HCT 29.1 (L) 36.0 - 46.0 %   MCV 93.6 78.0 - 100.0 fL   MCH 30.9 26.0 - 34.0 pg   MCHC 33.0 30.0 - 36.0 g/dL   RDW 13.0 11.5 - 15.5 %   Platelets 272 150 - 400 K/uL  Comprehensive metabolic panel     Status: Abnormal   Collection Time: 11/19/14 11:35 PM  Result Value Ref Range   Sodium 128 (L) 137 - 147 mEq/L   Potassium 6.7 (HH) 3.7 - 5.3 mEq/L    Comment: NO VISIBLE HEMOLYSIS CRITICAL RESULT CALLED TO, READ BACK BY AND  VERIFIED WITH: SANGELANG B,RN 11/20/14 0022 WAYK    Chloride 89 (L) 96 - 112 mEq/L   CO2 21 19 - 32 mEq/L   Glucose, Bld 260 (H) 70 - 99 mg/dL   BUN 83 (H) 6 - 23 mg/dL   Creatinine, Ser 6.65 (H) 0.50 - 1.10 mg/dL   Calcium 9.0 8.4 - 10.5 mg/dL   Total Protein 8.6 (H) 6.0 - 8.3 g/dL   Albumin 3.5 3.5 - 5.2 g/dL   AST 60 (H) 0 - 37 U/L   ALT 68 (H) 0 - 35 U/L   Alkaline Phosphatase 142 (H) 39 - 117 U/L   Total Bilirubin 0.3 0.3 - 1.2 mg/dL   GFR calc non Af Amer 6 (L) >90 mL/min   GFR calc Af Amer 7 (L) >90 mL/min    Comment: (NOTE) The eGFR has been calculated using the CKD EPI equation. This calculation has not been validated in all clinical situations. eGFR's persistently <90 mL/min signify possible Chronic Kidney Disease.    Anion gap 18 (H) 5 - 15  Pro b natriuretic peptide (BNP)     Status: Abnormal   Collection Time: 11/19/14 11:35 PM  Result Value Ref Range   Pro B Natriuretic peptide (BNP) 19448.0 (H) 0 - 125 pg/mL  I-stat troponin, ED     Status: Abnormal   Collection Time: 11/19/14 11:46 PM  Result Value Ref Range   Troponin i, poc 0.70 (HH) 0.00 - 0.08 ng/mL   Comment NOTIFIED PHYSICIAN    Comment 3            Comment: Due to the release kinetics of cTnI, a negative result within the first hours of the onset of symptoms does not rule out myocardial infarction with certainty. If myocardial infarction is still suspected, repeat the test at appropriate intervals.   Protime-INR     Status: None   Collection Time: 11/20/14 12:02 AM  Result Value Ref Range   Prothrombin Time 13.7 11.6 - 15.2 seconds   INR 1.04 0.00 - 1.49    Dg Chest 2 View  11/20/2014   CLINICAL DATA:  Respiratory distress and chest pain.  EXAM: CHEST  2 VIEW  COMPARISON:  01/02/2014.  FINDINGS: Mild cardiac enlargement. Small bilateral pleural effusions and mild to moderate interstitial edema is identified. No focal airspace consolidation identified. Spondylosis noted within the thoracic  spine.  IMPRESSION: 1. Mild to moderate CHF.   Electronically Signed   By: Kerby Moors M.D.   On: 11/20/2014 00:34    Review of Systems  Unable to perform ROS: medical condition  Constitutional: Positive  for malaise/fatigue. Negative for fever and chills.  HENT: Positive for hearing loss.   Eyes: Negative for double vision and discharge.  Respiratory: Positive for shortness of breath.   Cardiovascular: Positive for chest pain.  Full ROS could not be completed given deafness/blindness and acuity of medical condition Blood pressure 144/76, pulse 81, temperature 97.9 F (36.6 C), temperature source Oral, resp. rate 19, height $RemoveBe'5\' 4"'[id number]$  (1.626 m), weight 77.111 kg (170 lb), SpO2 100 %. Physical Exam  Nursing note and vitals reviewed. Constitutional: She appears distressed.  HENT:  Nose: Nose normal.  Mouth/Throat: Oropharynx is clear and moist. No oropharyngeal exudate.  bipap mask in place  Neck: JVD present. No tracheal deviation present.  Cardiovascular: Intact distal pulses.  Exam reveals gallop.   Tachycardic, S4 gallop  Respiratory: She is in respiratory distress. She has wheezes. She has rales.  GI: Soft. Bowel sounds are normal. She exhibits no distension. There is no tenderness.  Musculoskeletal: She exhibits edema.  Trace edema bilaterally  Neurological:  Alert. Unable to assess orientation. Deaf. She has a sign language interpreter at bedside but given shortness of breath challenging to decipher.   Skin: Skin is warm and dry. She is not diaphoretic. No erythema.  labs reviewed; wbc 10.1, h/h 9.6/29.1, plt 273, na 128, K 6.7, bun/cr 83/6.7, glucose 260, ast/alt 60/68, proBNP 19k, Trop 0.7 ECG reviewed and repeated/reviewed; SR, HR 80s, inferior infarct, age indeterminate, anterolateral MI, age indeterminate, ST Depression laterally/ischemia Echo reviewed from Dec/January 2014/2015: small LV cavity, severe concentric hypertrophy with septum/posterior walls 1.8-1.9 cm respectively  with prominent subendocardial stripe potentially consistent with amyloidosis, EF 65-70%, grade I DD, E/e' ratio > 20 c/w elevated LV filling pressures, moderate Posterior MAC, mild MR, mildly dilated LA, apical lipomatous hypertrophy, mild TR, Pulmonary hypertension with PA pressure 52 mmHg  Review of labs revealed elevated kappa/lambda free light chains   Assessment/Plan: Problem List Acute respiratory failure Hyperkalemia   Elevated Troponin/NSTEMI/? Recent STEMI with anterolateral and inferior q-wave development  ESRD on hemodialysis  Elevated BNP Severe concentric LVH from previous echo  Hypertension T2DM Dyslipidemia  Previously elevated kappa/lamda light chains   [name] is a 58 yo woman with PMH of deafness, blindness, hypertension, dyslipidemia, T2DM, ESRD on hemodialysis Monday/Wednesday/Friday who lives at a nursing home and presents with shortness of breath. She has a mildly elevated troponin. Differential diagnosis for her clinical condition is multifactorial including dietary indiscretion, ischemia, inadequate or missed dialysis, pneumonia, among other etiologies. I favor a multifactorial etiology given constellation of symptoms and findings. She has new development of anterolateral and inferior q-waves along with mildly elevated troponin. She had chest pain several days ago and days to 1+ week of shortness of breath from challenging and incomplete history given medical conditions (chronic and acute). Depending on the trend of her troponins, my first interpretation is that this is tail end of a prior MI and this may have happened several days ago based on development of q-waves and loss of anterior forces on her ECG. Even if she had continued chest pain, her hyperkalemia and pulmonary edema requiring bipap make cardiac catheterization contraindicated without correction of the metabolic disarray and either volume removal or intubation with mechanical ventilation. I have discussed  my thoughts with the internal medicine team as well as the family (son and spouse). We have asked renal to perform urgent dialysis. She continues to main hemodynamic stability. For now, we will treat medically for ACS  with heparin, aspirin, metoprolol and statin. We  will obtain an echocardiogram early this AM to assess wall motion and EF.  - treatment for hyperkalemia - consultation for consideration of urgent dialysis  - medical management of ACS with heparin gtt, aspirin 81 mg daily, atorvastatin 80 mg now and qHS - low dose metoprolol 12.5 mg bid with bp/hr holding parameters  - echocardiogram in AM  - telemetry/ICU admission  - update TSH, hba1c, lipid panel - have Internal Medicine team review previous kappa/lambda light chain results with hematology/oncology  - Appreciate renal and IM services assistance  Iyania Denne 11/20/2014, 2:30 AM

## 2014-11-20 NOTE — Progress Notes (Signed)
  Echocardiogram 2D Echocardiogram has been performed.  [name] 11/20/2014, 1:54 PM

## 2014-11-20 NOTE — Procedures (Signed)
Patient was seen on dialysis and the procedure was supervised.  BFR 400  Via AVF BP is  142/68.   Patient appears to be tolerating treatment well  [name] 11/20/2014

## 2014-11-20 NOTE — ED Notes (Signed)
This RN spoke with Renae, RN is hemodialysis who states that she is ready for the pt.

## 2014-11-20 NOTE — Progress Notes (Signed)
Pt was transported from HD to 2H25 on Bipap through vent. No complications noted. Pt was taken of Bipap when arrived to the unit and placed on 4 LPM [place]. BBS clear. No distress noted. SPO2 100%. Will titrate O2.

## 2014-11-20 NOTE — H&P (Signed)
[facility] History and Physical  [name] [id number] DOB: 1956-09-10 DOA: 11/19/2014  Referring physician: ED physician PCP: No primary care provider on file.  Specialists:   Chief Complaint: SOB and chest pain  HPI: [name] is a 58 y.o. female with past medical history of end-stage renal disease on dialysis (M/W/F), deafness, blindness, diabetes mellitus, anemia, hypertension, hyperlipidemia, who presents with shortness of breath and chest pain.  Patient is from a nursing home. History was obtained from patient via sign language translator, and also from her son and daughter. They stat that patient has worsening shortness of breath in the past 2 weeks which has been progressively getting worse. She has been having wheezing and mild dry cough per her son. Patient has subjective fever and chills. She also reports that she has had chest pain yesterday, which is better today, no current chest pain, but continued dyspnea. She has some mild nausea, but no vomiting or diarrhea. She denies abdominal pain, diarrhea, dysuria, urgency, frequency, hematuria, skin rashes, or leg swelling. When I evaluated pt in ED, she is on BIPAP with some variable tolerance.   In ED, she was found to have pulmonary edema on chest x-ray, potassium 6.7, troponin elevation at 0.70, proBNP 19448, temperature 97.9. She was given with insulin, kayexalate, sodium bicarbonate in ED. She is admitted to inpatient for further evaluation and treatment. Cardiology was consulted. Heparin gtt and Lasix 120 mg IV were ordered by Cardiology, Dr. Claiborne Billings. Renal was consulted for urgent dialysis  Review of Systems: As presented in the history of presenting illness, rest negative.  Where does patient live?  SNF  Can patient participate in ADLs? none  Allergy: No Known Allergies  Past Medical History  Diagnosis Date  . Hyperlipidemia   . Hypertension     a. Has previously refused blood pressure meds.   . Deaf      Divorced from husband but lives with him. Has daughter but she does not care for her.  . Bilateral leg edema     a. Chronic.  Marland Kitchen Psychiatric disorder     She frequently exhibits paranoia and has been diagnosed with psychotic d/o NOS during hospital stay in the past. She is tangetial and perseverative during her visits. She apparently has had a bad experience with mental health in [place] in the past and refuses to discuss mental issues for fear that she will be sent back there. Her paranoia, communication issues, financial woes and lack of fam  . Fibroid uterus   . Anemia     Due to fibroids.  . Heart murmur   . Diabetes mellitus     a. Per PCP note 2012 (A1C 9.2) - pt unwilling to take meds and was educated on risk of uncontrolled DM.  b. A1C 5.9 in 11/2013.    Past Surgical History  Procedure Laterality Date  . No past surgeries    . Insertion of dialysis catheter Right 01/02/2014    Procedure: INSERTION OF DIALYSIS CATHETER;  Surgeon: Rosetta Posner, MD;  Location: [facility];  Service: Vascular;  Laterality: Right;  . Av fistula placement Left 01/02/2014    Procedure: ARTERIOVENOUS (AV) FISTULA CREATION;  Surgeon: Rosetta Posner, MD;  Location: [facility];  Service: Vascular;  Laterality: Left;    Social History:  reports that she has never smoked. She does not have any smokeless tobacco history on file. She reports that she does not drink alcohol or use illicit drugs.  Family History:  Family History  Problem Relation Age of Onset  . Other Father     Drowned from fishing?  . Heart disease       Prior to Admission medications   Medication Sig Start Date End Date Taking? Authorizing Provider  aspirin 81 MG tablet Take 81 mg by mouth daily. For hypertension   Yes Historical Provider, MD  atorvastatin (LIPITOR) 10 MG tablet Take 10 mg by mouth daily.   Yes Historical Provider, MD  b complex-vitamin c-folic acid (NEPHRO-VITE) 0.8 MG TABS tablet Take 1 tablet by mouth daily.   Yes Historical Provider, MD   bisacodyl (DULCOLAX) 10 MG suppository Place 1 suppository (10 mg total) rectally daily as needed for mild constipation, moderate constipation or severe constipation. 01/08/14  Yes Kelvin Cellar, MD  calcium acetate (PHOSLO) 667 MG capsule Take 1 capsule (667 mg total) by mouth 3 (three) times daily with meals. 01/08/14  Yes Kelvin Cellar, MD  cloNIDine (CATAPRES - DOSED IN MG/24 HR) 0.2 mg/24hr patch Place 0.2 mg onto the skin once a week. For hypertension   Yes Historical Provider, MD  Docusate Sodium (DSS) 100 MG CAPS Take 100 mg by mouth 2 (two) times daily. For constipation 01/08/14  Yes Kelvin Cellar, MD  hydrALAZINE (APRESOLINE) 100 MG tablet Take 1 tablet (100 mg total) by mouth every 8 (eight) hours. 01/08/14  Yes Kelvin Cellar, MD  isosorbide mononitrate (IMDUR) 30 MG 24 hr tablet Take 1 tablet (30 mg total) by mouth daily. 01/08/14  Yes Kelvin Cellar, MD  lactulose (CHRONULAC) 10 GM/15ML solution Take 30 mLs (20 g total) by mouth 2 (two) times daily as needed for moderate constipation. 01/08/14  Yes Kelvin Cellar, MD  levalbuterol Penne Lash) 0.63 MG/3ML nebulizer solution Take 3 mLs (0.63 mg total) by nebulization every 2 (two) hours as needed for wheezing or shortness of breath. 01/08/14  Yes Kelvin Cellar, MD  metoprolol (LOPRESSOR) 100 MG tablet Take 1 tablet (100 mg total) by mouth 2 (two) times daily. 01/08/14  Yes Kelvin Cellar, MD  naphazoline-pheniramine (NAPHCON-A) 0.025-0.3 % ophthalmic solution Place 1 drop into both eyes 4 (four) times daily as needed for irritation. 01/08/14  Yes Kelvin Cellar, MD  promethazine (PHENERGAN) 12.5 MG suppository Place 12.5 mg rectally every 4 (four) hours as needed for nausea or vomiting.   Yes Historical Provider, MD  risperiDONE microspheres (RISPERDAL CONSTA) 25 MG injection Inject 25 mg into the muscle every 14 (fourteen) days. For psychosis   Yes Historical Provider, MD  darbepoetin (ARANESP) 100 MCG/0.5ML SOLN injection Inject 0.5 mLs  (100 mcg total) into the skin every Tuesday at 6 PM. Patient not taking: Reported on 11/20/2014 01/08/14   Kelvin Cellar, MD  multivitamin (RENA-VIT) TABS tablet Take 1 tablet by mouth at bedtime. Patient not taking: Reported on 11/20/2014 01/08/14   Kelvin Cellar, MD    Physical Exam: Filed Vitals:   11/20/14 0046 11/20/14 0100 11/20/14 0117 11/20/14 0130  BP:  165/86  160/81  Pulse:   83 84  Temp:      TempSrc:      Resp: 20 18 16 18   Height:      Weight:      SpO2:   100% 100%   General: in acute respiratory distress. bipap mask in place  HEENT:       Eyes: PERRL, EOMI, no scleral icterus       ENT: No discharge from the ears and nose, no pharynx injection, no tonsillar enlargement.  Neck: Positve JVD, no bruit, no mass felt. Cardiac: S1/S2, RRR, tachycardic, S4 gallop, no rubs Pulm: diffused rales bilaterally,  No wheezing or rubs. Abd: Soft, nondistended, nontender, no rebound pain, no organomegaly, BS present Ext: Trace leg edema bilaterally. 2+DP/PT pulse bilaterally Musculoskeletal: No joint deformities, erythema, or stiffness, ROM full Skin: No rashes.  Neuro: Alert. Unable to assess orientation. Deaf. She has a sign language interpreter at bedside but given shortness of breath challenging to decipher.  Moves all extremitates.  Psych: Patient is not psychotic, no suicidal or hemocidal ideation.  Labs on Admission:  Basic Metabolic Panel:  Recent Labs Lab 11/19/14 2335  NA 128*  K 6.7*  CL 89*  CO2 21  GLUCOSE 260*  BUN 83*  CREATININE 6.65*  CALCIUM 9.0   Liver Function Tests:  Recent Labs Lab 11/19/14 2335  AST 60*  ALT 68*  ALKPHOS 142*  BILITOT 0.3  PROT 8.6*  ALBUMIN 3.5   No results for input(s): LIPASE, AMYLASE in the last 168 hours. No results for input(s): AMMONIA in the last 168 hours. CBC:  Recent Labs Lab 11/19/14 2335  WBC 10.1  HGB 9.6*  HCT 29.1*  MCV 93.6  PLT 272   Cardiac Enzymes: No results for input(s):  CKTOTAL, CKMB, CKMBINDEX, TROPONINI in the last 168 hours.  BNP (last 3 results)  Recent Labs  11/19/14 2335  PROBNP 19448.0*   CBG: No results for input(s): GLUCAP in the last 168 hours.  Radiological Exams on Admission: Dg Chest 2 View  11/20/2014   CLINICAL DATA:  Respiratory distress and chest pain.  EXAM: CHEST  2 VIEW  COMPARISON:  01/02/2014.  FINDINGS: Mild cardiac enlargement. Small bilateral pleural effusions and mild to moderate interstitial edema is identified. No focal airspace consolidation identified. Spondylosis noted within the thoracic spine.  IMPRESSION: 1. Mild to moderate CHF.   Electronically Signed   By: Kerby Moors M.D.   On: 11/20/2014 00:34    EKG: Independently reviewed. 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 Patient has a history of diastolic congestive heart failure (2-D echo on 12/23/13 showed EF 65-70% with grade 1 diastolic dysfunction, also showed infiltrate process). Chest x-ray is consistent with pulmonary edema. The acute exacerbation is likely due to possible non-STEMI given elevated troponin and ischemic changes on EKG. Her end-stage renal disease may have also contributed.  -Admitted to stepdown -Renal was to consult for urgent dialysis. -BiPAP  NSTEMI: trop 0.70. EKG showed Q wave in inferior leads, T-wave inversion in lateral leads 1 and aVL,  slight ST elevation in V2. Cardiology was consulted. Dr. Claiborne Billings at the patient. - appreciate Dr. Evette Georges consultation - started heparin gtt - ASA, NG prn, morphine - low dose metoprolol 12.5 mg bid with bp/hr holding parameters  - will increase Lipitor from 10 to 80 mg daily - trop x 3 - EKG in am - echocardiogram in AM  -  telemetry/ICU admission  - update TSH, a1c, lipid panel - have Internal Medicine team review previous kappa/lambda light chain results with hematology/oncology per Dr. Claiborne Billings. I reviewed SPEP (12/24/13), no M-spike.  ESRD-HD (M/W/F): not miss any course of HD. -urgent HD per renal due to fluid overload and hyperkalemia  Hyperkalemia: Potassium 6.7 on admission. It was treated appropriately in ED -Urgent dialysis per renal  DM-II: Diet controlled. Last A1c 5.3 on 07/15/14. Blood sugar level elevated on admission at 260 on BMP. - ssi - check A1c  HTN:  -Continue home clonidine and hydralazine  -Metoprolol 12.5 twice a day   DVT ppx: omn Heparin IV  Code Status: Full code Family Communication:  Yes, patient's son and daughter  at bed side Disposition Plan: Admit to inpatient   Date of Service 11/20/2014    Ivor Costa [facility] Pager [phone]  If 7PM-7AM, please contact night-coverage [email or web address] Password [email or web address] 11/20/2014, 2:08 AM

## 2014-11-20 NOTE — Progress Notes (Signed)
ANTICOAGULATION CONSULT NOTE - FOLLOW UP    HL = 0.24 (goal 0.3 - 0.7 units/mL) Heparin dosing weight = 72 kg   Assessment: 58 YOF on IV heparin for ACS.  Heparin level sub-therapeutic but is trending up.  No bleeding reported.   Plan: - Increase heparin gtt to 1150 units/hr - F/U AM labs    Knox Cervi D. Mina Marble, PharmD, BCPS Pager:  [phone] 11/20/2014, 10:07 PM

## 2014-11-20 NOTE — Progress Notes (Signed)
ANTICOAGULATION CONSULT NOTE - Initial Consult  Pharmacy Consult for Heparin  Indication: chest pain/ACS  No Known Allergies  Patient Measurements: Height: 5\' 4"  (162.6 cm) Weight: 170 lb (77.111 kg) IBW/kg (Calculated) : 54.7  Vital Signs: Temp: 97.9 F (36.6 C) (11/26 2329) Temp Source: Oral (11/26 2329) BP: 168/76 mmHg (11/26 2345) Pulse Rate: 79 (11/26 2330)  Labs:  Recent Labs  11/19/14 2335  HGB 9.6*  HCT 29.1*  PLT 272    Estimated Creatinine Clearance: 11.4 mL/min (by C-G formula based on Cr of 5.4).   Medical History: Past Medical History  Diagnosis Date  . Hyperlipidemia   . Hypertension     a. Has previously refused blood pressure meds.   . Deaf     Divorced from husband but lives with him. Has daughter but she does not care for her.  . Bilateral leg edema     a. Chronic.  Marland Kitchen Psychiatric disorder     She frequently exhibits paranoia and has been diagnosed with psychotic d/o NOS during hospital stay in the past. She is tangetial and perseverative during her visits. She apparently has had a bad experience with mental health in [place] in the past and refuses to discuss mental issues for fear that she will be sent back there. Her paranoia, communication issues, financial woes and lack of fam  . Fibroid uterus   . Anemia     Due to fibroids.  . Heart murmur   . Diabetes mellitus     a. Per PCP note 2012 (A1C 9.2) - pt unwilling to take meds and was educated on risk of uncontrolled DM.  b. A1C 5.9 in 11/2013.   Assessment: 58 y/o F from NH with resp distress/CP to start heparin. Troponin is mildly elevated at 0.7. Hgb 9.6. Pt with CKD with Scr usually 4-5.   Goal of Therapy:  Heparin level 0.3-0.7 units/ml Monitor platelets by anticoagulation protocol: Yes   Plan:  -Heparin 4000 units BOLUS -Start heparin drip at 800 units/hr -0930 HL -Daily CBC/HL -Monitor for bleeding  Narda Bonds 11/20/2014,12:11 AM

## 2014-11-20 NOTE — ED Notes (Addendum)
Renae RN in dialysis will call when pt's machine is set up

## 2014-11-20 NOTE — Progress Notes (Addendum)
ANTICOAGULATION CONSULT NOTE - Initial Consult  Pharmacy Consult for Heparin  Indication: chest pain/ACS  No Known Allergies  Patient Measurements: Height: 5\' 4"  (162.6 cm) Weight: 175 lb 4.3 oz (79.5 kg) IBW/kg (Calculated) : 54.7  Vital Signs: Temp: 97.6 F (36.4 C) (11/27 1145) Temp Source: Oral (11/27 1145) BP: 152/75 mmHg (11/27 1145) Pulse Rate: 87 (11/27 1145)  Labs:  Recent Labs  11/19/14 2335 11/20/14 0002 11/20/14 0229 11/20/14 0245 11/20/14 1123  HGB 9.6*  --   --  9.6*  --   HCT 29.1*  --   --  29.9*  --   PLT 272  --   --  235  --   LABPROT  --  13.7  --   --   --   INR  --  1.04  --   --   --   HEPARINUNFRC  --   --   --   --  0.10*  CREATININE 6.65*  --   --   --   --   TROPONINI  --   --  0.42*  --   --     Estimated Creatinine Clearance: 9.4 mL/min (by C-G formula based on Cr of 6.65).   Medical History: Past Medical History  Diagnosis Date  . Hyperlipidemia   . Hypertension     a. Has previously refused blood pressure meds.   . Deaf     Divorced from husband but lives with him. Has daughter but she does not care for her.  . Bilateral leg edema     a. Chronic.  Marland Kitchen Psychiatric disorder     She frequently exhibits paranoia and has been diagnosed with psychotic d/o NOS during hospital stay in the past. She is tangetial and perseverative during her visits. She apparently has had a bad experience with mental health in [place] in the past and refuses to discuss mental issues for fear that she will be sent back there. Her paranoia, communication issues, financial woes and lack of fam  . Fibroid uterus   . Anemia     Due to fibroids.  . Heart murmur   . Diabetes mellitus     a. Per PCP note 2012 (A1C 9.2) - pt unwilling to take meds and was educated on risk of uncontrolled DM.  b. A1C 5.9 in 11/2013.   Assessment: 58 y/o F from NH with resp distress/CP to start heparin. First level came back subtherapeutic so we'll increase the rate.   Goal of  Therapy:  Heparin level 0.3-0.7 units/ml Monitor platelets by anticoagulation protocol: Yes   Plan:   Increase heparin to 1000 units/hr Check at 8 hr heparin level Daily CBC/HL  Onnie Boer, PharmD Pager: [phone] 11/20/2014 12:46 PM

## 2014-11-20 NOTE — ED Notes (Signed)
This RN and a respiratory therapist accompanied the pt up to the hemodialysis unit.

## 2014-11-20 NOTE — Plan of Care (Signed)
Problem: ICU Phase Progression Outcomes Goal: Hemodynamically stable Outcome: Completed/Met Date Met:  11/20/14

## 2014-11-20 NOTE — Progress Notes (Signed)
CRITICAL VALUE ALERT  Critical value received:  triponin 0.42  Date of notification:  11/20/14  Time of notification:  [id number]  Critical value read back:yes  Nurse who received alert:  Ocie Cornfield  MD notified (1st page): [phone]  Time of first page:  1545  MD notified (2nd page):  Time of second page:  Responding MD:  Dr. Jules Husbands  Time MD responded: [phone]

## 2014-11-20 NOTE — ED Notes (Signed)
EDP notified on elevated pottasium level reported by lab.

## 2014-11-20 NOTE — Progress Notes (Signed)
Nutrition Brief Note  Patient identified on the Malnutrition Screening Tool (MST) Report  Wt Readings from Last 15 Encounters:  11/20/14 175 lb 4.3 oz (79.5 kg)  10/21/14 180 lb (81.647 kg)  04/17/14 167 lb (75.751 kg)  03/17/14 185 lb (83.915 kg)  02/10/14 209 lb (94.802 kg)  01/12/14 209 lb (94.802 kg)  01/08/14 209 lb 3.5 oz (94.9 kg)  05/20/13 200 lb 6.4 oz (90.901 kg)  06/02/11 199 lb (90.266 kg)  05/23/11 204 lb (92.534 kg)  05/18/11 207 lb (93.895 kg)  05/12/11 209 lb (94.802 kg)  07/19/10 197 lb 6.4 oz (89.54 kg)  06/17/10 196 lb (88.905 kg)  06/03/10 198 lb 9.6 oz (90.084 kg)   58 y.o. female with past medical history of end-stage renal disease on dialysis (M/W/F), deafness, blindness, diabetes mellitus, anemia, hypertension, and hyperlipidemia who presented with shortness of breath and chest pain.  Pt is a resident of [facility]. Reviewed nursing home records which revealed no appetite changed. Pt with ESRD on HD and has had some edema present PTA, which may be contributory to weight changes. Pt was emergently dialyzed upon admission.   Body mass index is 30.07 kg/(m^2). Patient meets criteria for obesity, class I based on current BMI.   Current diet order is renal/carb modified wioth 1200 ml fluid restriction, patient is consuming approximately n/a% of meals at this time. Labs and medications reviewed.   No nutrition interventions warranted at this time. If nutrition issues arise, please consult RD.   Nikkol Pai A. Jimmye Norman, RD, LDN Pager: [phone] After hours Pager: [phone]

## 2014-11-20 NOTE — Progress Notes (Signed)
PT Note PT order received, order to begin on Monday 11/30.  Will initiate evaluation at that time. Thank you, 11/20/2014 [place], [place]

## 2014-11-20 NOTE — Progress Notes (Signed)
[facility] TEAM 1 - Stepdown/ICU TEAM Progress Note  [name] [id number] DOB: 11-04-1956 DOA: 11/19/2014 PCP: No primary care provider on file.  Admit HPI / Brief Narrative: 58 y.o. female with past medical history of end-stage renal disease on dialysis (M/W/F), deafness, blindness, diabetes mellitus, anemia, hypertension, and hyperlipidemia who presented with shortness of breath and chest pain.  Patient is from a nursing home. History was obtained from patient via sign language translator, and also from her son and daughter. They stated the patient had worsening shortness of breath over 2 weeks. She had been having wheezing and mild dry cough per her son. She also reported chest pain transiently the day prior to her admit. She had some mild nausea, but no vomiting or diarrhea.   In ED, she was found to have pulmonary edema on chest x-ray, potassium 6.7, troponin elevation at 0.70, temperature 97.9. She was given insulin, kayexalate, sodium bicarbonate in ED. Cardiology was consulted. Renal was consulted for urgent dialysis  HPI/Subjective: Pt seen for a f/u visit.  Assessment/Plan:  Acute anterior MI Cardiology following - medical tx for now   Pulmonary edema / volume overload tx w/ HD   Transaminitis   ESRD on HD (M/W/F) Dialyzed emergently 11/26>11/27 night  HTN  HLD  Deaf / Legally blind  Psychotic d/o NOS  DM  Anemia of ESRD  Code Status: FULL Family Communication: no family present at time of exam Disposition Plan: SDU  Consultants: Cardiology Nephrology  Procedures: none  Antibiotics: none  DVT prophylaxis: IV heparin   Objective: Blood pressure 152/75, pulse 87, temperature 97.6 F (36.4 C), temperature source Oral, resp. rate 15, height 5\' 4"  (1.626 m), weight 79.5 kg (175 lb 4.3 oz), SpO2 100 %.  Intake/Output Summary (Last 24 hours) at 11/20/14 1347 Last data filed at 11/20/14 0754  Gross per 24 hour  Intake      0 ml  Output    5001 ml  Net  -5001 ml   Exam: F/U exam completed  Data Reviewed: Basic Metabolic Panel:  Recent Labs Lab 11/19/14 2335 11/20/14 0412  NA 128*  --   K 6.7* 4.3  CL 89*  --   CO2 21  --   GLUCOSE 260*  --   BUN 83*  --   CREATININE 6.65*  --   CALCIUM 9.0  --     Liver Function Tests:  Recent Labs Lab 11/19/14 2335  AST 60*  ALT 68*  ALKPHOS 142*  BILITOT 0.3  PROT 8.6*  ALBUMIN 3.5   Coags:  Recent Labs Lab 11/20/14 0002  INR 1.04   CBC:  Recent Labs Lab 11/19/14 2335 11/20/14 0245  WBC 10.1 9.1  HGB 9.6* 9.6*  HCT 29.1* 29.9*  MCV 93.6 94.6  PLT 272 235    Cardiac Enzymes:  Recent Labs Lab 11/20/14 0229  TROPONINI 0.42*   BNP (last 3 results)  Recent Labs  11/19/14 2335  PROBNP 19448.0*    CBG:  Recent Labs Lab 11/20/14 1157  GLUCAP 120*    Recent Results (from the past 240 hour(s))  MRSA PCR Screening     Status: None   Collection Time: 11/20/14  9:05 AM  Result Value Ref Range Status   MRSA by PCR NEGATIVE NEGATIVE Final    Comment:        The GeneXpert MRSA Assay (FDA approved for NASAL specimens only), is one component of a comprehensive MRSA colonization surveillance program. It is not intended to diagnose  MRSA infection nor to guide or monitor treatment for MRSA infections.      Studies:  Recent x-ray studies have been reviewed in detail by the Attending Physician  Scheduled Meds:  Scheduled Meds: . aspirin  324 mg Oral Once  . aspirin  81 mg Oral Daily  . atorvastatin  80 mg Oral Q2000  . calcium acetate  667 mg Oral TID WC  . [START ON 11/24/2014] cloNIDine  0.2 mg Transdermal Weekly  . [START ON 11/23/2014] darbepoetin (ARANESP) injection - DIALYSIS  40 mcg Intravenous Q Mon-HD  . docusate sodium  100 mg Oral BID  . doxercalciferol  1 mcg Oral Q M,W,F-HD  . [START ON 11/23/2014] ferric gluconate (FERRLECIT/NULECIT) IV  125 mg Intravenous Q Mon-HD  . furosemide  120 mg Intravenous Once  .  hydrALAZINE  100 mg Oral 3 times per day  . insulin aspart  0-9 Units Subcutaneous TID WC  . isosorbide mononitrate  30 mg Oral Daily  . metoprolol tartrate  12.5 mg Oral BID  . multivitamin  1 tablet Oral QHS  . [START ON 11/29/2014] risperiDONE microspheres  25 mg Intramuscular Q14 Days  . sodium chloride  3 mL Intravenous Q12H  . sodium chloride  3 mL Intravenous Q12H  . sodium polystyrene  30 g Oral Once    Time spent on care of this patient: +25 mins   Willette Mudry T , MD   [facility] Office  [phone] Pager - Text Page per Shea Evans as per below:  On-Call/Text Page:      [email or web address]      password [email or web address]  If 7PM-7AM, please contact night-coverage [email or web address] Password [email or web address] 11/20/2014, 1:47 PM   LOS: 1 day

## 2014-11-20 NOTE — Progress Notes (Signed)
Admission history taken from pt. thru language interpreter.

## 2014-11-20 NOTE — Progress Notes (Signed)
Renal Post-HD Note 5 liters removed with acute HD Standing post HD weight 79.5 kg which is 1 kg below previous outpt EDW of 80.5 kg O2Sats now 100% (transitioned to nasal cannula).  Jamal Maes, MD [facility] [phone] Pager 11/20/2014, 2:29 PM

## 2014-11-20 NOTE — Plan of Care (Signed)
Problem: ICU Phase Progression Outcomes Goal: Initial discharge plan identified Outcome: Completed/Met Date Met:  11/20/14

## 2014-11-20 NOTE — Progress Notes (Signed)
     See note from Dr. Claiborne Billings from this am Patient is in no distress. Unable to get any further hx   She does not appear to be in any distress.  No intrepreter available at this time.    Notes from last night indicate that she has had worsening dyspnea for several days.   VSS  BP 152/75 mmHg  Pulse 87  Temp(Src) 97.6 F (36.4 C) (Oral)  Resp 15  Ht 5\' 4"  (1.626 m)  Wt 175 lb 4.3 oz (79.5 kg)  BMI 30.07 kg/m2  SpO2 100%  Lungs clear Cor:  RR  ext: no edema   ECG :  Ant. ST elevation throughout all precordial leads.   These changes may be due to lead placement variability compared to the previous echo done at 11:58 PM last night.  Bedside echo showed:  Mid - distal anteroseptal akinesis + apical akinesis. "real " echo by the echo department is being done now   1. Actue ant. MI:  I suspect that she is outside of the window to benefit from emergent cath and PCI.  She is at least 12 hours out from presentation to the ER and her symptoms started several days ago.    Continue medical therapy. ASA 81 a day Lipitor 80 a day Hydralazine 100 TID  metoprolol 12. 5 bid    Thayer Headings, Brooke Bonito., MD, [facility] 11/20/2014, 12:45 PM [address]  [place] Pager [phone]

## 2014-11-20 NOTE — Plan of Care (Signed)
Problem: ICU Phase Progression Outcomes Goal: Dyspnea controlled at rest Outcome: Completed/Met Date Met:  11/20/14

## 2014-11-20 NOTE — Progress Notes (Signed)
Patient is doing well on 2L nasal cannula.  SPO2 100, and BBS clear.  No BiPAP needed at this time.  Nurse will inform RT if needed.

## 2014-11-20 NOTE — Consult Note (Signed)
Reason for Consult: To manage dialysis and dialysis related needs Referring Physician: Yianna Tersigni [name] is an 58 y.o. female with PM hx significant for DM, HTN, hyperlipidemia, deafness, blindness NH resident.  She also has ESRD- HD MWF at [facility].  It seems like she has been compliant with her HD- received on 11/22 and 11/24 (holiday schedule).  Family reports worsening SOB for 2 weeks which finally became critical overnight.  She presented to the Er with increased WOB required bipap. CXR showed pulmonary edema, K of 6.7 and also elevated troponins.  She was dialyzed overnight emergently- volume is coming off and she is more comfortable.  K is also down.  No interpreter is present right now- she is moving around but there is an inability to communicate at this time.    Dialyzes at [facility] 80.5. 4 hours 15 min HD Bath 2 K /2.5 calc , Dialyzer 180, Heparin 9000 bolus. Access AVF. aranesp 25 weekly, hectorol 1 mcg weekly and venofer 100 weekly  Past Medical History  Diagnosis Date  . Hyperlipidemia   . Hypertension     a. Has previously refused blood pressure meds.   . Deaf     Divorced from husband but lives with him. Has daughter but she does not care for her.  . Bilateral leg edema     a. Chronic.  Marland Kitchen Psychiatric disorder     She frequently exhibits paranoia and has been diagnosed with psychotic d/o NOS during hospital stay in the past. She is tangetial and perseverative during her visits. She apparently has had a bad experience with mental health in [place] in the past and refuses to discuss mental issues for fear that she will be sent back there. Her paranoia, communication issues, financial woes and lack of fam  . Fibroid uterus   . Anemia     Due to fibroids.  . Heart murmur   . Diabetes mellitus     a. Per PCP note 2012 (A1C 9.2) - pt unwilling to take meds and was educated on risk of uncontrolled DM.  b. A1C 5.9 in 11/2013.    Past Surgical History  Procedure Laterality Date  .  No past surgeries    . Insertion of dialysis catheter Right 01/02/2014    Procedure: INSERTION OF DIALYSIS CATHETER;  Surgeon: Rosetta Posner, MD;  Location: [facility];  Service: Vascular;  Laterality: Right;  . Av fistula placement Left 01/02/2014    Procedure: ARTERIOVENOUS (AV) FISTULA CREATION;  Surgeon: Rosetta Posner, MD;  Location: [facility] OR;  Service: Vascular;  Laterality: Left;    Family History  Problem Relation Age of Onset  . Other Father     Drowned from fishing?  . Heart disease      Social History:  reports that she has never smoked. She does not have any smokeless tobacco history on file. She reports that she does not drink alcohol or use illicit drugs.  Allergies: No Known Allergies  Medications: I have reviewed the patient's current medications.   Results for orders placed or performed during the hospital encounter of 11/19/14 (from the past 48 hour(s))  CBC     Status: Abnormal   Collection Time: 11/19/14 11:35 PM  Result Value Ref Range   WBC 10.1 4.0 - 10.5 K/uL   RBC 3.11 (L) 3.87 - 5.11 MIL/uL   Hemoglobin 9.6 (L) 12.0 - 15.0 g/dL   HCT 29.1 (L) 36.0 - 46.0 %   MCV 93.6 78.0 -  100.0 fL   MCH 30.9 26.0 - 34.0 pg   MCHC 33.0 30.0 - 36.0 g/dL   RDW 13.0 11.5 - 15.5 %   Platelets 272 150 - 400 K/uL  Comprehensive metabolic panel     Status: Abnormal   Collection Time: 11/19/14 11:35 PM  Result Value Ref Range   Sodium 128 (L) 137 - 147 mEq/L   Potassium 6.7 (HH) 3.7 - 5.3 mEq/L    Comment: NO VISIBLE HEMOLYSIS CRITICAL RESULT CALLED TO, READ BACK BY AND VERIFIED WITH: SANGELANG B,RN 11/20/14 0022 WAYK    Chloride 89 (L) 96 - 112 mEq/L   CO2 21 19 - 32 mEq/L   Glucose, Bld 260 (H) 70 - 99 mg/dL   BUN 83 (H) 6 - 23 mg/dL   Creatinine, Ser 6.65 (H) 0.50 - 1.10 mg/dL   Calcium 9.0 8.4 - 10.5 mg/dL   Total Protein 8.6 (H) 6.0 - 8.3 g/dL   Albumin 3.5 3.5 - 5.2 g/dL   AST 60 (H) 0 - 37 U/L   ALT 68 (H) 0 - 35 U/L   Alkaline Phosphatase 142 (H) 39 - 117 U/L   Total  Bilirubin 0.3 0.3 - 1.2 mg/dL   GFR calc non Af Amer 6 (L) >90 mL/min   GFR calc Af Amer 7 (L) >90 mL/min    Comment: (NOTE) The eGFR has been calculated using the CKD EPI equation. This calculation has not been validated in all clinical situations. eGFR's persistently <90 mL/min signify possible Chronic Kidney Disease.    Anion gap 18 (H) 5 - 15  Pro b natriuretic peptide (BNP)     Status: Abnormal   Collection Time: 11/19/14 11:35 PM  Result Value Ref Range   Pro B Natriuretic peptide (BNP) 19448.0 (H) 0 - 125 pg/mL  I-stat troponin, ED     Status: Abnormal   Collection Time: 11/19/14 11:46 PM  Result Value Ref Range   Troponin i, poc 0.70 (HH) 0.00 - 0.08 ng/mL   Comment NOTIFIED PHYSICIAN    Comment 3            Comment: Due to the release kinetics of cTnI, a negative result within the first hours of the onset of symptoms does not rule out myocardial infarction with certainty. If myocardial infarction is still suspected, repeat the test at appropriate intervals.   Protime-INR     Status: None   Collection Time: 11/20/14 12:02 AM  Result Value Ref Range   Prothrombin Time 13.7 11.6 - 15.2 seconds   INR 1.04 0.00 - 1.49  Troponin I     Status: Abnormal   Collection Time: 11/20/14  2:29 AM  Result Value Ref Range   Troponin I 0.42 (HH) <0.30 ng/mL    Comment:        Due to the release kinetics of cTnI, a negative result within the first hours of the onset of symptoms does not rule out myocardial infarction with certainty. If myocardial infarction is still suspected, repeat the test at appropriate intervals. CRITICAL RESULT CALLED TO, READ BACK BY AND VERIFIED WITH: RICH R,RN 11/20/14 0342 WAYK   CBC     Status: Abnormal   Collection Time: 11/20/14  2:45 AM  Result Value Ref Range   WBC 9.1 4.0 - 10.5 K/uL   RBC 3.16 (L) 3.87 - 5.11 MIL/uL   Hemoglobin 9.6 (L) 12.0 - 15.0 g/dL   HCT 29.9 (L) 36.0 - 46.0 %   MCV 94.6 78.0 - 100.0 fL  MCH 30.4 26.0 - 34.0 pg    MCHC 32.1 30.0 - 36.0 g/dL   RDW 13.3 11.5 - 15.5 %   Platelets 235 150 - 400 K/uL  Potassium     Status: None   Collection Time: 11/20/14  4:12 AM  Result Value Ref Range   Potassium 4.3 3.7 - 5.3 mEq/L    Comment: DELTA CHECK NOTED    Dg Chest 2 View  11/20/2014   CLINICAL DATA:  Respiratory distress and chest pain.  EXAM: CHEST  2 VIEW  COMPARISON:  01/02/2014.  FINDINGS: Mild cardiac enlargement. Small bilateral pleural effusions and mild to moderate interstitial edema is identified. No focal airspace consolidation identified. Spondylosis noted within the thoracic spine.  IMPRESSION: 1. Mild to moderate CHF.   Electronically Signed   By: Kerby Moors M.D.   On: 11/20/2014 00:34    ROS: unable to determine due to inability to communicate Blood pressure 133/86, pulse 75, temperature 94.5 F (34.7 C), temperature source Axillary, resp. rate 8, height _0  (1.626 m), weight 77.111 kg (170 lb), SpO2 100 %. General appearance: alert, mild distress, mildly obese and uncooperative Resp: diminished breath sounds bibasilar Cardio: regular rate and rhythm, S1, S2 normal, no murmur, click, rub or gallop GI: soft, non-tender; bowel sounds normal; no masses,  no organomegaly Extremities: extremities normal, atraumatic, no cyanosis or edema Skin: Skin color, texture, turgor normal. No rashes or lesions Neurologic: Grossly normal  Assessment/Plan: 58 year old BF with multiple medical issues now presenting with SOB/pulmonary edema req bipap, hyperkalemia with positive troponins 1 SOB- definitely pulmonary edema on CXR- whether is due to cardiac ischemia vs need for EDW drop.  Emergent HD overnight- removing 5 liters without issue- supposedly under her EDW ? Seems better clinically after HD- supportive care hopefully to wean bipap.  Also issue of positive troponin- suspicious for MI at some point.  Cardiology involved  2 ESRD: normally MWF at [facility] via AVF- had full HD Sunday and Tuesday so does not  seem to be a dialysis compliance issue- but number are worse than would suspect being compliant with HD- ?AVF issue ? Will watch here and investigate if needed 3 Hypertension: BP here holding with UF- was on hydralazine/clonidine/imdur at home- maybe needed EDW lower and does not need as much BP meds ?  4. Anemia of ESRD: hgb in the 9's- normally on aranesp low dose and iron- continue and follow here 5. Metabolic Bone Disease: at home on hectorol low dose, phoslo and nephrovite   Thank you for this consult, we will follow with you   Rory Xiang A 11/20/2014, 6:51 AM

## 2014-11-20 NOTE — ED Notes (Signed)
Per Maudie Mercury in patient placement, the pt will be going to a room on 2H when she has finished her dialysis.

## 2014-11-21 ENCOUNTER — Other Ambulatory Visit: Payer: Self-pay

## 2014-11-21 DIAGNOSIS — I10 Essential (primary) hypertension: Secondary | ICD-10-CM

## 2014-11-21 DIAGNOSIS — I213 ST elevation (STEMI) myocardial infarction of unspecified site: Secondary | ICD-10-CM | POA: Diagnosis present

## 2014-11-21 DIAGNOSIS — E785 Hyperlipidemia, unspecified: Secondary | ICD-10-CM

## 2014-11-21 DIAGNOSIS — I2109 ST elevation (STEMI) myocardial infarction involving other coronary artery of anterior wall: Principal | ICD-10-CM

## 2014-11-21 DIAGNOSIS — I255 Ischemic cardiomyopathy: Secondary | ICD-10-CM

## 2014-11-21 DIAGNOSIS — Z992 Dependence on renal dialysis: Secondary | ICD-10-CM

## 2014-11-21 DIAGNOSIS — N186 End stage renal disease: Secondary | ICD-10-CM

## 2014-11-21 LAB — GLUCOSE, CAPILLARY
Glucose-Capillary: 109 mg/dL — ABNORMAL HIGH (ref 70–99)
Glucose-Capillary: 137 mg/dL — ABNORMAL HIGH (ref 70–99)
Glucose-Capillary: 178 mg/dL — ABNORMAL HIGH (ref 70–99)
Glucose-Capillary: 183 mg/dL — ABNORMAL HIGH (ref 70–99)

## 2014-11-21 LAB — RENAL FUNCTION PANEL
Albumin: 3.2 g/dL — ABNORMAL LOW (ref 3.5–5.2)
Anion gap: 15 (ref 5–15)
BUN: 45 mg/dL — ABNORMAL HIGH (ref 6–23)
CO2: 26 mEq/L (ref 19–32)
Calcium: 9 mg/dL (ref 8.4–10.5)
Chloride: 93 mEq/L — ABNORMAL LOW (ref 96–112)
Creatinine, Ser: 4.77 mg/dL — ABNORMAL HIGH (ref 0.50–1.10)
GFR calc Af Amer: 11 mL/min — ABNORMAL LOW (ref >90)
GFR calc non Af Amer: 9 mL/min — ABNORMAL LOW (ref >90)
Glucose, Bld: 166 mg/dL — ABNORMAL HIGH (ref 70–99)
Phosphorus: 5.2 mg/dL — ABNORMAL HIGH (ref 2.3–4.6)
Potassium: 5 mEq/L (ref 3.7–5.3)
Sodium: 134 mEq/L — ABNORMAL LOW (ref 137–147)

## 2014-11-21 LAB — CBC
HCT: 27.7 % — ABNORMAL LOW (ref 36.0–46.0)
Hemoglobin: 9 g/dL — ABNORMAL LOW (ref 12.0–15.0)
MCH: 31.6 pg (ref 26.0–34.0)
MCHC: 32.5 g/dL (ref 30.0–36.0)
MCV: 97.2 fL (ref 78.0–100.0)
Platelets: 251 10*3/uL (ref 150–400)
RBC: 2.85 MIL/uL — ABNORMAL LOW (ref 3.87–5.11)
RDW: 13.2 % (ref 11.5–15.5)
WBC: 8.7 10*3/uL (ref 4.0–10.5)

## 2014-11-21 LAB — HEMOGLOBIN A1C
Hgb A1c MFr Bld: 6 % — ABNORMAL HIGH (ref <5.7)
Mean Plasma Glucose: 126 mg/dL — ABNORMAL HIGH (ref <117)

## 2014-11-21 LAB — HEPARIN LEVEL (UNFRACTIONATED)
Heparin Unfractionated: <0.1 IU/mL — ABNORMAL LOW (ref 0.30–0.70)
Heparin Unfractionated: 0.2 IU/mL — ABNORMAL LOW (ref 0.30–0.70)

## 2014-11-21 LAB — TROPONIN I: Troponin I: 0.81 ng/mL (ref <0.30)

## 2014-11-21 MED ORDER — PROMETHAZINE HCL 25 MG/ML IJ SOLN: 12.5000 mg | Freq: Four times a day (QID) | INTRAMUSCULAR | Status: DC | PRN

## 2014-11-21 MED ORDER — HEPARIN BOLUS VIA INFUSION
2000.0000 [IU] | Freq: Once | INTRAVENOUS | Status: AC
  Administered 2014-11-21: 2000 [IU] via INTRAVENOUS
  Filled 2014-11-21: qty 2000

## 2014-11-21 MED ORDER — ONDANSETRON HCL 4 MG/2ML IJ SOLN: 4.0000 mg | Freq: Four times a day (QID) | INTRAMUSCULAR | Status: DC | PRN

## 2014-11-21 NOTE — Plan of Care (Signed)
Problem: Phase I Progression Outcomes Goal: Tolerating diet Outcome: Completed/Met Date Met:  11/21/14     

## 2014-11-21 NOTE — Plan of Care (Signed)
Problem: Phase I Progression Outcomes Goal: Discharge plan established Outcome: Completed/Met Date Met:  11/21/14

## 2014-11-21 NOTE — Progress Notes (Signed)
[facility] Rounding Note: Subjective:  No interpreter/signer available Pt appears comfortabl on nasal O2 No increased WOB Tolerated HD yesterday 5 liters removed with acute HD Standing post HD weight 79.5 kg which is 1 kg below previous outpt EDW of 80.5 kg  Objective Vital signs in last 24 hours: Filed Vitals:   11/21/14 0000 11/21/14 0400 11/21/14 0640 11/21/14 0800  BP: 142/68 131/101 140/56 131/61  Pulse: 89 84  91  Temp: 98.2 F (36.8 C) 98.1 F (36.7 C)  98.5 F (36.9 C)  TempSrc: Oral Oral  Oral  Resp: 14 12  14   Height:      Weight:      SpO2: 100% 100%  99%   Weight change: 2.388 kg (5 lb 4.3 oz)  Intake/Output Summary (Last 24 hours) at 11/21/14 0903 Last data filed at 11/20/14 2000  Gross per 24 hour  Intake    198 ml  Output      0 ml  Net    198 ml   Physical Exam:  Blood pressure 131/61, pulse 91, temperature 98.5 F (36.9 C), temperature source Oral, resp. rate 14, height 5\' 4"  (1.626 m), weight 79.5 kg (175 lb 4.3 oz), SpO2 99 %. Looks quite comfortable Allows me to listen to heart and lungs No JVD Lungs clear anteriorly Tachy 90's S1S2 No S3 Abd without obvious tenderness No edema of LE's AVF + bruit/thrill  Labs: Basic Metabolic Panel:  Recent Labs Lab 11/19/14 2335 11/20/14 0412 11/21/14 0156  NA 128*  --  134*  K 6.7* 4.3 5.0  CL 89*  --  93*  CO2 21  --  26  GLUCOSE 260*  --  166*  BUN 83*  --  45*  CREATININE 6.65*  --  4.77*  CALCIUM 9.0  --  9.0  PHOS  --   --  5.2*     Recent Labs Lab 11/19/14 2335 11/21/14 0156  AST 60*  --   ALT 68*  --   ALKPHOS 142*  --   BILITOT 0.3  --   PROT 8.6*  --   ALBUMIN 3.5 3.2*    Recent Labs Lab 11/19/14 2335 11/20/14 0245 11/21/14 0156  WBC 10.1 9.1 8.7  HGB 9.6* 9.6* 9.0*  HCT 29.1* 29.9* 27.7*  MCV 93.6 94.6 97.2  PLT 272 235 251    Recent Labs Lab 11/20/14 0229 11/20/14 1439 11/20/14 1918 11/21/14 0156  TROPONINI 0.42* 1.63* 1.30* 0.81*      Recent Labs Lab 11/20/14 1157 11/20/14 1657 11/20/14 2146 11/21/14 0812  GLUCAP 120* 113* 137* 109*    Studies/Results: Dg Chest 2 View  11/20/2014   CLINICAL DATA:  Respiratory distress and chest pain.  EXAM: CHEST  2 VIEW  COMPARISON:  01/02/2014.  FINDINGS: Mild cardiac enlargement. Small bilateral pleural effusions and mild to moderate interstitial edema is identified. No focal airspace consolidation identified. Spondylosis noted within the thoracic spine.  IMPRESSION: 1. Mild to moderate CHF.   Electronically Signed   By: Kerby Moors M.D.   On: 11/20/2014 00:34   11/20/14 2D Echo EF30-35% Compared to January 2015 there is a new severe and extensive wall motion abnormality consistent with infarction in the LAD artery distribution. LV systolic function has decreased substantially  Medications: . heparin 1,150 Units/hr (11/21/14 0800)   . aspirin  324 mg Oral Once  . aspirin  81 mg Oral Daily  . atorvastatin  80 mg Oral Q2000  . calcium acetate  667 mg  Oral TID WC  . [START ON 11/24/2014] cloNIDine  0.2 mg Transdermal Weekly  . [START ON 11/23/2014] darbepoetin (ARANESP) injection - DIALYSIS  40 mcg Intravenous Q Mon-HD  . docusate sodium  100 mg Oral BID  . doxercalciferol  1 mcg Oral Q M,W,F-HD  . [START ON 11/23/2014] ferric gluconate (FERRLECIT/NULECIT) IV  125 mg Intravenous Q Mon-HD  . hydrALAZINE  100 mg Oral 3 times per day  . insulin aspart  0-9 Units Subcutaneous TID WC  . isosorbide mononitrate  30 mg Oral Daily  . metoprolol tartrate  12.5 mg Oral BID  . multivitamin  1 tablet Oral QHS  . [START ON 11/29/2014] risperiDONE microspheres  25 mg Intramuscular Q14 Days    Dialyzes at [facility] MWF EDW 80.5. Will be lower at discharge 4 hours 15 min HD Bath 2 K /2.5 calc , Dialyzer 180, Heparin 9000 bolus. Access AVF. aranesp 25 weekly, hectorol 1 mcg weekly and venofer 100 weekly  Assessment/Plan:  58 year old BF with multiple medical issues including  DM, HTN, blind, deaf, ESRD who presented  with SOB/pulmonary edema requiring bipap, hyperkalemia,  with positive troponins - responded favorably to acute dialysis/volume removal. ECHO shows reduced EF and new WMA's c/w ischemic event.   1. Pulmonary edema - improved. Cardiac ischemia vs need for EDW drop vs both. S/p emergent dialysis yesterday. Now 1 kg under previous EDW and lung fields clear. Cardiac enzymes and ECHO c/w ischemic event. Will attempt to keep as dry as possible.   2. Hyperkalemia - improved with HD. Back to 5 today. Recheck in AM. Would not be due for HD again until Monday 3. S/p MI - new anterolateral and inferior ECG changes, ECHO with reduced EF and reduced wall motion in LAD territory. Per cardiology 4. ESRD:  MWF at [place] via AVF. Next HD Monday 5. Hypertension: BP here holding with UF- was on hydralazine/clonidine/imdur at home- maybe needed EDW lower and does not need as much BP meds Now on hydralazine/BB/nitrates  6. Anemia of ESRD: hgb in the 9's- normally on aranesp low dose and iron- continue and follow here. Aranesp increased 25->40 and will receive a dose with Monday HD 7. Metabolic Bone Disease: at home on hectorol low dose, phoslo and nephrovite  8. DM - per primary service   Jamal Maes, MD [facility] [phone] pager 11/21/2014, 9:03 AM

## 2014-11-21 NOTE — Progress Notes (Signed)
Pharmacist Heart Failure Core Measure Documentation  Assessment: [name] has an EF documented as 30% on 11/27 by ECHO.  Rationale: Heart failure patients with left ventricular systolic dysfunction (LVSD) and an EF < 40% should be prescribed an angiotensin converting enzyme inhibitor (ACEI) or angiotensin receptor blocker (ARB) at discharge unless a contraindication is documented in the medical record.  This patient is not currently on an ACEI or ARB for HF.  This note is being placed in the record in order to provide documentation that a contraindication to the use of these agents is present for this encounter.  ACE Inhibitor or Angiotensin Receptor Blocker is contraindicated (specify all that apply)  []   ACEI allergy AND ARB allergy []   Angioedema []   Moderate or severe aortic stenosis []   Hyperkalemia []   Hypotension []   Renal artery stenosis [x]   Worsening renal function, preexisting renal disease or dysfunction   Wilfred Lacy 11/21/2014 1:35 PM

## 2014-11-21 NOTE — Progress Notes (Signed)
Consulting cardiologist: Dr. Grayland Jack  Seen for followup: Late presentation anterior MI with CHF  Subjective:    Patient is blind, deaf, and mute. She is in no distress.  Objective:   Temp:  [98 F (36.7 C)-98.5 F (36.9 C)] 98.3 F (36.8 C) (11/28 1200) Pulse Rate:  [76-93] 87 (11/28 1200) Resp:  [9-22] 13 (11/28 1200) BP: (107-142)/(48-101) 126/62 mmHg (11/28 1200) SpO2:  [99 %-100 %] 100 % (11/28 1200)    Filed Weights   11/19/14 2329 11/20/14 0900  Weight: 170 lb (77.111 kg) 175 lb 4.3 oz (79.5 kg)    Intake/Output Summary (Last 24 hours) at 11/21/14 1250 Last data filed at 11/21/14 1200  Gross per 24 hour  Intake    590 ml  Output      0 ml  Net    590 ml    Telemetry: Sinus rhythm.  Exam:  General: Appears comfortable at rest.  Lungs: Scattered crackles. Nonlabored breathing.  Cardiac: RRR without gallop.  Extremities: No pitting edema.   Lab Results:  Basic Metabolic Panel:  Recent Labs Lab 11/19/14 2335 11/20/14 0412 11/21/14 0156  NA 128*  --  134*  K 6.7* 4.3 5.0  CL 89*  --  93*  CO2 21  --  26  GLUCOSE 260*  --  166*  BUN 83*  --  45*  CREATININE 6.65*  --  4.77*  CALCIUM 9.0  --  9.0    Liver Function Tests:  Recent Labs Lab 11/19/14 2335 11/21/14 0156  AST 60*  --   ALT 68*  --   ALKPHOS 142*  --   BILITOT 0.3  --   PROT 8.6*  --   ALBUMIN 3.5 3.2*    CBC:  Recent Labs Lab 11/19/14 2335 11/20/14 0245 11/21/14 0156  WBC 10.1 9.1 8.7  HGB 9.6* 9.6* 9.0*  HCT 29.1* 29.9* 27.7*  MCV 93.6 94.6 97.2  PLT 272 235 251    Cardiac Enzymes:  Recent Labs Lab 11/20/14 1439 11/20/14 1918 11/21/14 0156  TROPONINI 1.63* 1.30* 0.81*    BNP:  Recent Labs  11/19/14 2335  PROBNP 19448.0*    Echocardiogram (11/20/2014): Study Conclusions  - Left ventricle: The cavity size was normal. There was moderate concentric hypertrophy. Systolic function was moderately to severely reduced. The estimated  ejection fraction was in the range of 30% to 35%. Dyskinesis and aneurysmal deformity of the mid-apicalanteroseptal, anterior, inferior, inferoseptal, and apical myocardium; consistent with infarction in the distribution of the left anterior descending coronary artery. Features are consistent with a pseudonormal left ventricular filling pattern, with concomitant abnormal relaxation and increased filling pressure (grade 2 diastolic dysfunction). - Mitral valve: Severely calcified annulus. - Left atrium: The atrium was moderately dilated. - Pulmonary arteries: Systolic pressure was mildly increased. PA peak pressure: 44 mm Hg (S).  Impressions:  - Compared to January 2015 there is a new severe and extensive wall motion abnormality consistent with infarction in the LAD artery distribution. LV systolic function has decreased substantially.   Medications:   Scheduled Medications: . aspirin  324 mg Oral Once  . aspirin  81 mg Oral Daily  . atorvastatin  80 mg Oral Q2000  . calcium acetate  667 mg Oral TID WC  . [START ON 11/24/2014] cloNIDine  0.2 mg Transdermal Weekly  . [START ON 11/23/2014] darbepoetin (ARANESP) injection - DIALYSIS  40 mcg Intravenous Q Mon-HD  . docusate sodium  100 mg Oral BID  . doxercalciferol  1 mcg Oral Q M,W,F-HD  . [START ON 11/23/2014] ferric gluconate (FERRLECIT/NULECIT) IV  125 mg Intravenous Q Mon-HD  . hydrALAZINE  100 mg Oral 3 times per day  . insulin aspart  0-9 Units Subcutaneous TID WC  . isosorbide mononitrate  30 mg Oral Daily  . metoprolol tartrate  12.5 mg Oral BID  . multivitamin  1 tablet Oral QHS  . [START ON 11/29/2014] risperiDONE microspheres  25 mg Intramuscular Q14 Days     Infusions: . heparin 1,150 Units/hr (11/21/14 1200)     PRN Medications:  bisacodyl, lactulose, levalbuterol, morphine injection, naphazoline-pheniramine, nitroGLYCERIN, promethazine   Assessment:   1. Presentation with shortness of  breath and possibly recent chest pain, suspected late presentation anterior wall myocardial infarction, troponin I levels are elevated but fairly low level abnormal. ECG consistent with recent anterolateral STEMI. LVEF 30-35% with wall motion abnormalities consistent with LAD distribution infarct and grade 2 diastolic dysfunction.   2. Ischemic cardiomyopathy as outlined above.  3. ESRD on hemodialysis.  4. Essential hypertension. Blood pressure stable.  5 .Hyperlipidemia, on statin therapy.  6. Communication limitations including blindness and deafness.   Plan/Discussion:    I reviewed the chart. Patient is hemodynamically stable at this time. Decision made yesterday by Dr. Claiborne Billings and Dr. Acie Fredrickson to treat medically without urgent coronary angiography in light of apparent late presentation infarct. Continue aspirin, hydralazine, clonidine, Lipitor, Lopressor, and Imdur. She also continues on heparin. HD per nephrology - breathing status improved significantly. In the absence of recurrent ischemic symptoms now, no clear indication to pursue coronary angiography at this specific time. Risk for mechanical complications of anterior wall infarct is increased. Continue to follow closely in the unit.   Satira Sark, M.D., F.A.C.C.

## 2014-11-21 NOTE — Progress Notes (Signed)
ANTICOAGULATION CONSULT NOTE - Follow Up Consult  Pharmacy Consult for Heparin  Indication: chest pain/ACS  No Known Allergies  Patient Measurements: Height: 5\' 4"  (162.6 cm) Weight: 175 lb 4.3 oz (79.5 kg) IBW/kg (Calculated) : 54.7  Vital Signs: Temp: 98.2 F (36.8 C) (11/28 0000) Temp Source: Oral (11/28 0000) BP: 131/101 mmHg (11/28 0400) Pulse Rate: 84 (11/28 0400)  Labs:  Recent Labs  11/19/14 2335 11/20/14 0002  11/20/14 0245 11/20/14 1123 11/20/14 1439 11/20/14 1918 11/20/14 2135 11/21/14 0156  HGB 9.6*  --   --  9.6*  --   --   --   --  9.0*  HCT 29.1*  --   --  29.9*  --   --   --   --  27.7*  PLT 272  --   --  235  --   --   --   --  251  LABPROT  --  13.7  --   --   --   --   --   --   --   INR  --  1.04  --   --   --   --   --   --   --   HEPARINUNFRC  --   --   --   --  0.10*  --   --  0.24* <0.10*  CREATININE 6.65*  --   --   --   --   --   --   --  4.77*  TROPONINI  --   --   < >  --   --  1.63* 1.30*  --  0.81*  < > = values in this interval not displayed.  Estimated Creatinine Clearance: 13.1 mL/min (by C-G formula based on Cr of 4.77).  Assessment: Undetectable heparin level, heparin was supposed to have been increased yesterday evening but order was never placed, Other labs as above, no current issues per RN.   Goal of Therapy:  Heparin level 0.3-0.7 units/ml Monitor platelets by anticoagulation protocol: Yes   Plan:  -Heparin 2000 units BOLUS -Increase heparin drip to 1150 units/hr -1500 HL -Daily CBC/HL -Monitor for bleeding  Narda Bonds 11/21/2014,6:21 AM

## 2014-11-21 NOTE — Progress Notes (Signed)
ANTICOAGULATION CONSULT NOTE - Follow Up Consult  Pharmacy Consult for Heparin  Indication: chest pain/ACS  No Known Allergies  Patient Measurements: Height: 5\' 4"  (162.6 cm) Weight: 175 lb 4.3 oz (79.5 kg) IBW/kg (Calculated) : 54.7  Vital Signs: Temp: 98.3 F (36.8 C) (11/28 1644) Temp Source: Oral (11/28 1644) BP: 137/58 mmHg (11/28 1644) Pulse Rate: 93 (11/28 1644)  Labs:  Recent Labs  11/19/14 2335 11/20/14 0002  11/20/14 0245  11/20/14 1439 11/20/14 1918 11/20/14 2135 11/21/14 0156 11/21/14 1611  HGB 9.6*  --   --  9.6*  --   --   --   --  9.0*  --   HCT 29.1*  --   --  29.9*  --   --   --   --  27.7*  --   PLT 272  --   --  235  --   --   --   --  251  --   LABPROT  --  13.7  --   --   --   --   --   --   --   --   INR  --  1.04  --   --   --   --   --   --   --   --   HEPARINUNFRC  --   --   --   --   < >  --   --  0.24* <0.10* 0.20*  CREATININE 6.65*  --   --   --   --   --   --   --  4.77*  --   TROPONINI  --   --   < >  --   --  1.63* 1.30*  --  0.81*  --   < > = values in this interval not displayed.  Estimated Creatinine Clearance: 13.1 mL/min (by C-G formula based on Cr of 4.77).  Assessment: Heparin level remains subtherapeutic.  No issues with infusion per RN.  Goal of Therapy:  Heparin level 0.3-0.7 units/ml Monitor platelets by anticoagulation protocol: Yes   Plan:  -Increase heparin drip to 1300 units/hr -HL in 6 hours -Daily CBC/HL -Monitor for bleeding  Berl Bonfanti Poteet 11/21/2014,4:48 PM

## 2014-11-21 NOTE — Plan of Care (Signed)
Problem: Phase I Progression Outcomes Goal: Hemodynamically stable Outcome: Completed/Met Date Met:  11/21/14     

## 2014-11-21 NOTE — Plan of Care (Signed)
Problem: ICU Phase Progression Outcomes Goal: Flu/PneumoVaccines if indicated Outcome: Completed/Met Date Met:  11/21/14

## 2014-11-21 NOTE — Progress Notes (Signed)
Pt became diaphretic, heart rate increased to 120s, BP elevated to 198/87; pt restless; increased rhonci; respiratory evaluated and felt respiratory status was more cardiac than pulmonary; paged MD for [facility] on call; EKG obtained; Morphine 2mg  IV given; CBG = 178; both Dr. Hilbert Bible and Dr. Percival Spanish returned pages and were updated; no new orders given at this time; will continue to monitor

## 2014-11-21 NOTE — Progress Notes (Signed)
Patient asking for bedpan, pulling down panties. Bedpan placed under patient and she voided 65ml of amber colored urine. Patient wiped self and new panties placed on patient. She asked for water in a mumbled voice, water given to patient and she drank 284ml. Sprite zero given to patient and she drank that as well. Gave patient a container of orange sherbet and she enjoyed that.  Warm blanket placed on patient and she mumbled thank you and smiled. Will continue to monitor.

## 2014-11-21 NOTE — Progress Notes (Signed)
[place] TEAM 1 - Stepdown/ICU TEAM Progress Note  [name] [id number] DOB: 12-02-1956 DOA: 11/19/2014 PCP: No primary care provider on file.  Admit HPI / Brief Narrative: 58 y.o. female with past medical history of end-stage renal disease on dialysis (M/W/F), deafness, blindness, diabetes mellitus, anemia, hypertension, and hyperlipidemia who presented with shortness of breath and chest pain.  Patient is from a nursing home. History was obtained from patient via sign language translator, and also from her son and daughter. They stated the patient had worsening shortness of breath over 2 weeks. She had been having wheezing and mild dry cough per her son. She also reported chest pain transiently the day prior to her admit. She had some mild nausea, but no vomiting or diarrhea.   In ED, she was found to have pulmonary edema on chest x-ray, potassium 6.7, troponin elevation at 0.70, temperature 97.9. She was given insulin, kayexalate, sodium bicarbonate in ED. Cardiology was consulted. Renal was consulted for urgent dialysis  HPI/Subjective: No family present at time of exam.  Pt does not appear to be uncomfortable.  No interpreter available at the time of my exam.    Assessment/Plan:  Acute anterior MI Cardiology following - medical tx is paln - troponin appears to have peaked at 1.63 - currently on IV heparin   Pulmonary edema / volume overload tx w/ emergent HD w/ 5L removed - likely due to acute MI - Nephrology considering dropping EDW  Hyperkalemia  Improved w/ HD - per Nephrology   Transaminitis  likely "shock liver" due to MI vs/ passive congestion related to acute CHF - f/u in AM - no sx to suggest infection/stone  ESRD on HD (M/W/F) Dialyzed emergently 11/26>11/27 night  HTN BP reasonably controlled at present - follow w/o change today - [place] term goal will be SBP closer to 120 - follow w/ possible drop in EDW  HLD Cont lipitor   Deaf / Legally  blind  Psychotic d/o NOS  DM CBG currently well controlled   Anemia of ESRD Hgb stable - epo and Fe per Nephrology   Code Status: FULL Family Communication: no family present at time of exam Disposition Plan: SDU  Consultants: Cardiology Nephrology  Procedures: TTE - 11/27 - EF 30-35% - dyskinesis and aneursymal deformity of the mid-apical, anteroseptal, anterior, inferior, inferoseptal, and apical myocardium; consistent with infarction in the distribution of the left anterior descending coronary artery - grade 2 DD  Antibiotics: none  DVT prophylaxis: IV heparin   Objective: Blood pressure 131/61, pulse 91, temperature 98.5 F (36.9 C), temperature source Oral, resp. rate 14, height 5\' 4"  (1.626 m), weight 79.5 kg (175 lb 4.3 oz), SpO2 99 %.  Intake/Output Summary (Last 24 hours) at 11/21/14 0943 Last data filed at 11/21/14 0800  Gross per 24 hour  Intake    398 ml  Output      0 ml  Net    398 ml   Exam: General: No acute respiratory distress Lungs: Clear to auscultation bilaterally without wheezes or crackles Cardiovascular: Regular rate and rhythm without murmur gallop or rub normal S1 and S2 Abdomen: Nontender, nondistended, soft, bowel sounds positive, no rebound, no ascites, no appreciable mass Extremities: No significant cyanosis, clubbing, or edema bilateral lower extremities  Data Reviewed: Basic Metabolic Panel:  Recent Labs Lab 11/19/14 2335 11/20/14 0412 11/21/14 0156  NA 128*  --  134*  K 6.7* 4.3 5.0  CL 89*  --  93*  CO2 21  --  26  GLUCOSE 260*  --  166*  BUN 83*  --  45*  CREATININE 6.65*  --  4.77*  CALCIUM 9.0  --  9.0  PHOS  --   --  5.2*    Liver Function Tests:  Recent Labs Lab 11/19/14 2335 11/21/14 0156  AST 60*  --   ALT 68*  --   ALKPHOS 142*  --   BILITOT 0.3  --   PROT 8.6*  --   ALBUMIN 3.5 3.2*   Coags:  Recent Labs Lab 11/20/14 0002  INR 1.04   CBC:  Recent Labs Lab 11/19/14 2335 11/20/14 0245  11/21/14 0156  WBC 10.1 9.1 8.7  HGB 9.6* 9.6* 9.0*  HCT 29.1* 29.9* 27.7*  MCV 93.6 94.6 97.2  PLT 272 235 251    Cardiac Enzymes:  Recent Labs Lab 11/20/14 0229 11/20/14 1439 11/20/14 1918 11/21/14 0156  TROPONINI 0.42* 1.63* 1.30* 0.81*   BNP (last 3 results)  Recent Labs  11/19/14 2335  PROBNP 19448.0*    CBG:  Recent Labs Lab 11/20/14 1157 11/20/14 1657 11/20/14 2146 11/21/14 0812  GLUCAP 120* 113* 137* 109*    Recent Results (from the past 240 hour(s))  MRSA PCR Screening     Status: None   Collection Time: 11/20/14  9:05 AM  Result Value Ref Range Status   MRSA by PCR NEGATIVE NEGATIVE Final    Comment:        The GeneXpert MRSA Assay (FDA approved for NASAL specimens only), is one component of a comprehensive MRSA colonization surveillance program. It is not intended to diagnose MRSA infection nor to guide or monitor treatment for MRSA infections.      Studies:  Recent x-ray studies have been reviewed in detail by the Attending Physician  Scheduled Meds:  Scheduled Meds: . aspirin  324 mg Oral Once  . aspirin  81 mg Oral Daily  . atorvastatin  80 mg Oral Q2000  . calcium acetate  667 mg Oral TID WC  . [START ON 11/24/2014] cloNIDine  0.2 mg Transdermal Weekly  . [START ON 11/23/2014] darbepoetin (ARANESP) injection - DIALYSIS  40 mcg Intravenous Q Mon-HD  . docusate sodium  100 mg Oral BID  . doxercalciferol  1 mcg Oral Q M,W,F-HD  . [START ON 11/23/2014] ferric gluconate (FERRLECIT/NULECIT) IV  125 mg Intravenous Q Mon-HD  . hydrALAZINE  100 mg Oral 3 times per day  . insulin aspart  0-9 Units Subcutaneous TID WC  . isosorbide mononitrate  30 mg Oral Daily  . metoprolol tartrate  12.5 mg Oral BID  . multivitamin  1 tablet Oral QHS  . [START ON 11/29/2014] risperiDONE microspheres  25 mg Intramuscular Q14 Days    Time spent on care of this patient: 35 mins   Reyann Troop T , MD   [facility] Office   [phone] Pager - Text Page per Shea Evans as per below:  On-Call/Text Page:      [email or web address]      password [email or web address]  If 7PM-7AM, please contact night-coverage [email or web address] Password [email or web address] 11/21/2014, 9:43 AM   LOS: 2 days

## 2014-11-21 NOTE — Progress Notes (Addendum)
Audible wheezing noted, became agitated, hr-120, breathing treatment of xopenex given and placed on 100%NRM-  o2 sat . 100%. Heart rate down to 104 . Calm down at once, no further wheezing noted, endorsed.

## 2014-11-21 NOTE — Plan of Care (Signed)
Problem: ICU Phase Progression Outcomes Goal: O2 sats trending toward baseline Outcome: Completed/Met Date Met:  11/21/14

## 2014-11-22 DIAGNOSIS — J9601 Acute respiratory failure with hypoxia: Secondary | ICD-10-CM

## 2014-11-22 LAB — RENAL FUNCTION PANEL
Albumin: 3.4 g/dL — ABNORMAL LOW (ref 3.5–5.2)
Anion gap: 18 — ABNORMAL HIGH (ref 5–15)
BUN: 62 mg/dL — ABNORMAL HIGH (ref 6–23)
CO2: 24 mEq/L (ref 19–32)
Calcium: 8.8 mg/dL (ref 8.4–10.5)
Chloride: 86 mEq/L — ABNORMAL LOW (ref 96–112)
Creatinine, Ser: 6.34 mg/dL — ABNORMAL HIGH (ref 0.50–1.10)
GFR calc Af Amer: 8 mL/min — ABNORMAL LOW (ref >90)
GFR calc non Af Amer: 7 mL/min — ABNORMAL LOW (ref >90)
Glucose, Bld: 170 mg/dL — ABNORMAL HIGH (ref 70–99)
Phosphorus: 5.4 mg/dL — ABNORMAL HIGH (ref 2.3–4.6)
Potassium: 5.8 mEq/L — ABNORMAL HIGH (ref 3.7–5.3)
Sodium: 128 mEq/L — ABNORMAL LOW (ref 137–147)

## 2014-11-22 LAB — CBC
HCT: 28.1 % — ABNORMAL LOW (ref 36.0–46.0)
Hemoglobin: 9 g/dL — ABNORMAL LOW (ref 12.0–15.0)
MCH: 30.3 pg (ref 26.0–34.0)
MCHC: 32 g/dL (ref 30.0–36.0)
MCV: 94.6 fL (ref 78.0–100.0)
Platelets: 266 10*3/uL (ref 150–400)
RBC: 2.97 MIL/uL — ABNORMAL LOW (ref 3.87–5.11)
RDW: 13.3 % (ref 11.5–15.5)
WBC: 11.6 10*3/uL — ABNORMAL HIGH (ref 4.0–10.5)

## 2014-11-22 LAB — GLUCOSE, CAPILLARY
Glucose-Capillary: 123 mg/dL — ABNORMAL HIGH (ref 70–99)
Glucose-Capillary: 99 mg/dL (ref 70–99)

## 2014-11-22 LAB — HEPARIN LEVEL (UNFRACTIONATED)
Heparin Unfractionated: 1.78 IU/mL — ABNORMAL HIGH (ref 0.30–0.70)
Heparin Unfractionated: 0.29 IU/mL — ABNORMAL LOW (ref 0.30–0.70)
Heparin Unfractionated: 0.52 IU/mL (ref 0.30–0.70)

## 2014-11-22 MED ORDER — SODIUM POLYSTYRENE SULFONATE 15 GM/60ML PO SUSP
30.0000 g | Freq: Once | ORAL | Status: AC
  Administered 2014-11-22: 30 g via ORAL
  Filled 2014-11-22: qty 120

## 2014-11-22 MED ORDER — METOPROLOL TARTRATE 25 MG PO TABS
25.0000 mg | ORAL_TABLET | Freq: Two times a day (BID) | ORAL | Status: DC
  Administered 2014-11-22 – 2014-11-25 (×6): 25 mg via ORAL
  Filled 2014-11-22 (×7): qty 1

## 2014-11-22 NOTE — Progress Notes (Signed)
Consulting cardiologist: Dr. Grayland Jack  Seen for followup: Late presentation anterior MI with CHF  Subjective:    Resting comfortable at this morning. Events of evening noted.  Objective:   Temp:  [97.4 F (36.3 C)-98.5 F (36.9 C)] 97.4 F (36.3 C) (11/29 0300) Pulse Rate:  [75-110] 77 (11/29 0400) Resp:  [10-22] 10 (11/29 0400) BP: (126-198)/(57-94) 147/76 mmHg (11/29 0639) SpO2:  [99 %-100 %] 100 % (11/29 0400) Weight:  [182 lb 15.7 oz (83 kg)] 182 lb 15.7 oz (83 kg) (11/29 0300)    Filed Weights   11/19/14 2329 11/20/14 0900 11/22/14 0300  Weight: 170 lb (77.111 kg) 175 lb 4.3 oz (79.5 kg) 182 lb 15.7 oz (83 kg)    Intake/Output Summary (Last 24 hours) at 11/22/14 0736 Last data filed at 11/22/14 0600  Gross per 24 hour  Intake 1261.5 ml  Output    100 ml  Net 1161.5 ml    Telemetry: Sinus rhythm.  Exam:  General: Appears comfortable at rest.  Lungs: Scattered crackles. Nonlabored breathing.  Cardiac: RRR without gallop.  Abdomen: Protuberant.  Extremities: No pitting edema.   Lab Results:  Basic Metabolic Panel:  Recent Labs Lab 11/19/14 2335 11/20/14 0412 11/21/14 0156 11/22/14 0210  NA 128*  --  134* 128*  K 6.7* 4.3 5.0 5.8*  CL 89*  --  93* 86*  CO2 21  --  26 24  GLUCOSE 260*  --  166* 170*  BUN 83*  --  45* 62*  CREATININE 6.65*  --  4.77* 6.34*  CALCIUM 9.0  --  9.0 8.8    CBC:  Recent Labs Lab 11/20/14 0245 11/21/14 0156 11/22/14 0210  WBC 9.1 8.7 11.6*  HGB 9.6* 9.0* 9.0*  HCT 29.9* 27.7* 28.1*  MCV 94.6 97.2 94.6  PLT 235 251 266    Cardiac Enzymes:  Recent Labs Lab 11/20/14 1439 11/20/14 1918 11/21/14 0156  TROPONINI 1.63* 1.30* 0.81*    Echocardiogram (11/20/2014): Study Conclusions  - Left ventricle: The cavity size was normal. There was moderate concentric hypertrophy. Systolic function was moderately to severely reduced. The estimated ejection fraction was in the range of 30% to 35%.  Dyskinesis and aneurysmal deformity of the mid-apicalanteroseptal, anterior, inferior, inferoseptal, and apical myocardium; consistent with infarction in the distribution of the left anterior descending coronary artery. Features are consistent with a pseudonormal left ventricular filling pattern, with concomitant abnormal relaxation and increased filling pressure (grade 2 diastolic dysfunction). - Mitral valve: Severely calcified annulus. - Left atrium: The atrium was moderately dilated. - Pulmonary arteries: Systolic pressure was mildly increased. PA peak pressure: 44 mm Hg (S).  Impressions:  - Compared to January 2015 there is a new severe and extensive wall motion abnormality consistent with infarction in the LAD artery distribution. LV systolic function has decreased substantially.   Medications:   Scheduled Medications: . aspirin  81 mg Oral Daily  . atorvastatin  80 mg Oral Q2000  . calcium acetate  667 mg Oral TID WC  . [START ON 11/24/2014] cloNIDine  0.2 mg Transdermal Weekly  . [START ON 11/23/2014] darbepoetin (ARANESP) injection - DIALYSIS  40 mcg Intravenous Q Mon-HD  . docusate sodium  100 mg Oral BID  . doxercalciferol  1 mcg Oral Q M,W,F-HD  . [START ON 11/23/2014] ferric gluconate (FERRLECIT/NULECIT) IV  125 mg Intravenous Q Mon-HD  . hydrALAZINE  100 mg Oral 3 times per day  . insulin aspart  0-9 Units Subcutaneous TID WC  .  isosorbide mononitrate  30 mg Oral Daily  . metoprolol tartrate  12.5 mg Oral BID  . multivitamin  1 tablet Oral QHS  . [START ON 11/29/2014] risperiDONE microspheres  25 mg Intramuscular Q14 Days    Infusions: . heparin 1,400 Units/hr (11/22/14 0600)    PRN Medications: bisacodyl, lactulose, levalbuterol, morphine injection, naphazoline-pheniramine, nitroGLYCERIN, ondansetron (ZOFRAN) IV, promethazine   Assessment:   1. Presentation with shortness of breath and possibly recent chest pain, suspected late  presentation anterior wall myocardial infarction, troponin I levels are elevated but fairly low level abnormal. ECG consistent with recent anterolateral STEMI. LVEF 30-35% with wall motion abnormalities consistent with LAD distribution infarct and grade 2 diastolic dysfunction.   2. Ischemic cardiomyopathy as outlined above. Intermittent shortness of breath noted overnight, possibly component of volume overload although 5 L removed after initial HD. Currently hemodynamically stable.  3. ESRD on hemodialysis. Followed by Nephrology.  4. Essential hypertension. Blood pressure stable, but upward trend.  5 .Hyperlipidemia, on statin therapy.  6. Communication limitations including blindness and deafness.   Plan/Discussion:    Continue aspirin, Lipitor, clonidine, hydralazine, Imdur, increase Lopressor to 25 mg twice daily. Nephrology following for hemodialysis and anticipated additional volume removal. Would continue heparin infusion for a total of 72 hours for ACS then consider discontinuing. Patient does have increased risk for mechanical complication of late presentation anterior wall infarct. If she has a sudden dramatic change in status, would consider stat echocardiogram. Overall plan is to continue to stabilize medically, would pursue a viability study to assess anterior wall and any residual ischemic burden that might warrant pursuing cardiac catheterization. Prognosis is guarded.   Satira Sark, M.D., F.A.C.C.

## 2014-11-22 NOTE — Progress Notes (Addendum)
ANTICOAGULATION CONSULT NOTE - Follow Up Consult  Pharmacy Consult for Heparin  Indication: chest pain/ACS   Labs:  Recent Labs  11/19/14 2335 11/20/14 0002  11/20/14 0245  11/20/14 1439 11/20/14 1918  11/21/14 0156 11/21/14 1611 11/21/14 2253  HGB 9.6*  --   --  9.6*  --   --   --   --  9.0*  --   --   HCT 29.1*  --   --  29.9*  --   --   --   --  27.7*  --   --   PLT 272  --   --  235  --   --   --   --  251  --   --   LABPROT  --  13.7  --   --   --   --   --   --   --   --   --   INR  --  1.04  --   --   --   --   --   --   --   --   --   HEPARINUNFRC  --   --   --   --   < >  --   --   < > <0.10* 0.20* 1.78*  CREATININE 6.65*  --   --   --   --   --   --   --  4.77*  --   --   TROPONINI  --   --   < >  --   --  1.63* 1.30*  --  0.81*  --   --   < > = values in this interval not displayed.  Assessment: Elevated HL of 1.78 in error, drawn within inches from heparin infusion site.   Goal of Therapy:  Heparin level 0.3-0.7 units/ml Monitor platelets by anticoagulation protocol: Yes   Plan:  -Re-draw HL   Consuela Widener 11/22/2014,1:26 AM   Addendum 2:44 AM Repeat HL is 0.29 -Increase slightly to 1400 units/hr -Victoria Vera, PharmD

## 2014-11-22 NOTE — Progress Notes (Signed)
[facility] Rounding Note: Subjective:  No interpreter/signer available Pt appears comfortable on nasal O2 but had episode during the night of diaphoresis/hypertension/tachycardia/restlesness - required morphine and xopenex.  Eventually resolved.  Trop did not bump this AM Currently no increased WOB  Tolerated HD 11/27 and had 5 liters of fluid. Standing post HD was weight 79.5 kg  (1 kg below previous outpt EDW of 80.5 kg) and she will be due for HD again tomorrow - will try for additional fluid removal  Objective Vital signs in last 24 hours: Filed Vitals:   11/22/14 0600 11/22/14 0639 11/22/14 0700 11/22/14 0800  BP: 147/76 147/76 147/76 151/79  Pulse: 77  86 81  Temp:   97.7 F (36.5 C)   TempSrc:   Oral   Resp: 14  11 9   Height:      Weight:      SpO2: 100%  100% 100%   Weight change: 3.5 kg (7 lb 11.5 oz)  Intake/Output Summary (Last 24 hours) at 11/22/14 0952 Last data filed at 11/22/14 0900  Gross per 24 hour  Intake 1563.5 ml  Output    100 ml  Net 1463.5 ml   Physical Exam:  BP 151/79 mmHg  Pulse 81  Temp(Src) 97.7 F (36.5 C) (Oral)  Resp 9  Ht 5\' 4"  (1.626 m)  Wt 83 kg (182 lb 15.7 oz)  BMI 31.39 kg/m2  SpO2 100% Looks comfortable No JVD Lungs clear anteriorly S1S2 No S3 or rub. Heart rate regular in the 80's Abd without obvious tenderness No edema of LE's AVF + bruit/thrill  Labs: Basic Metabolic Panel:  Recent Labs Lab 11/19/14 2335 11/20/14 0412 11/21/14 0156 11/22/14 0210  NA 128*  --  134* 128*  K 6.7* 4.3 5.0 5.8*  CL 89*  --  93* 86*  CO2 21  --  26 24  GLUCOSE 260*  --  166* 170*  BUN 83*  --  45* 62*  CREATININE 6.65*  --  4.77* 6.34*  CALCIUM 9.0  --  9.0 8.8  PHOS  --   --  5.2* 5.4*     Recent Labs Lab 11/19/14 2335 11/21/14 0156 11/22/14 0210  AST 60*  --   --   ALT 68*  --   --   ALKPHOS 142*  --   --   BILITOT 0.3  --   --   PROT 8.6*  --   --   ALBUMIN 3.5 3.2* 3.4*    Recent Labs Lab  11/19/14 2335 11/20/14 0245 11/21/14 0156 11/22/14 0210  WBC 10.1 9.1 8.7 11.6*  HGB 9.6* 9.6* 9.0* 9.0*  HCT 29.1* 29.9* 27.7* 28.1*  MCV 93.6 94.6 97.2 94.6  PLT 272 235 251 266    Recent Labs Lab 11/20/14 0229 11/20/14 1439 11/20/14 1918 11/21/14 0156  TROPONINI 0.42* 1.63* 1.30* 0.81*     Recent Labs Lab 11/21/14 1210 11/21/14 1643 11/21/14 2017 11/21/14 2148 11/22/14 0754  GLUCAP 137* 123* 178* 183* 99    Studies/Results:  11/20/14 2D Echo EF30-35% Compared to January 2015 there is a new severe and extensive wall motion abnormality consistent with infarction in the LAD artery distribution. LV systolic function has decreased substantially  Medications: . heparin 1,400 Units/hr (11/22/14 0900)   . aspirin  81 mg Oral Daily  . atorvastatin  80 mg Oral Q2000  . calcium acetate  667 mg Oral TID WC  . [START ON 11/24/2014] cloNIDine  0.2 mg Transdermal Weekly  . [START  ON 11/23/2014] darbepoetin (ARANESP) injection - DIALYSIS  40 mcg Intravenous Q Mon-HD  . docusate sodium  100 mg Oral BID  . doxercalciferol  1 mcg Oral Q M,W,F-HD  . [START ON 11/23/2014] ferric gluconate (FERRLECIT/NULECIT) IV  125 mg Intravenous Q Mon-HD  . hydrALAZINE  100 mg Oral 3 times per day  . insulin aspart  0-9 Units Subcutaneous TID WC  . isosorbide mononitrate  30 mg Oral Daily  . metoprolol tartrate  25 mg Oral BID  . multivitamin  1 tablet Oral QHS  . [START ON 11/29/2014] risperiDONE microspheres  25 mg Intramuscular Q14 Days    Dialyzes at [facility] MWF EDW 80.5. Will be lower at discharge 4 hours 15 min HD Bath 2 K /2.5 calc,  Dialyzer 180, Heparin 9000 bolus. Access AVF. aranesp 25 weekly, hectorol 1 mcg weekly and venofer 100 weekly  Assessment/Plan:  58 year old BF with multiple medical issues including DM, HTN, blind, deaf, ESRD who presented  with SOB/pulmonary edema requiring bipap, hyperkalemia,  with positive troponins - responded favorably to acute  dialysis/volume removal. ECHO shows reduced EF and new WMA's c/w ischemic event in the LAD distribution.   1. Pulmonary edema - improved. Cardiac ischemia vs need for EDW drop vs both. Emergent HD on admission to weight 1 kg under EDW. Cardiac enzymes and ECHO c/w ischemic event. Will attempt to keep as dry as possible.   2. Hyperkalemia - admission K of 6.7 but has drifted back up to 5.8 today no hemolysis. Will give dose of kayexalate today and plan for HD in the AM  3. ESRD:  MWF at [place] via AVF. Next HD Monday. Will be lowering EDW 4. S/p MI - new anterolateral and inferior ECG changes, ECHO with reduced EF and reduced wall motion in LAD territory. Per cardiology. Heparin, nitrates, BB, BP control, viability study to determine if residual ischemic burden that would warrant heart cath. 5. Hypertension: Meds, volume control 6. Anemia of ESRD: hgb in the 9's- normally on aranesp low dose and iron- continue and follow here. Aranesp increased 25->40 and will receive a dose with Monday HD 7. Metabolic Bone Disease: at home on hectorol low dose, phoslo and nephrovite  8. DM - per primary service   Jamal Maes, MD [facility] [phone] pager 11/22/2014, 9:52 AM

## 2014-11-22 NOTE — Progress Notes (Signed)
[facility] TEAM 1 - Stepdown/ICU TEAM Progress Note  [name] [id number] DOB: [birth year] DOA: 11/19/2014 PCP: No primary care provider on file.  Admit HPI / Brief Narrative: 58 y.o. female with past medical history of end-stage renal disease on dialysis (M/W/F), deafness, blindness, diabetes mellitus, anemia, hypertension, and hyperlipidemia who presented with shortness of breath and chest pain.  Patient is from a nursing home. History was obtained from patient via sign language translator, and also from her son and daughter. They stated the patient had worsening shortness of breath over 2 weeks. She had been having wheezing and mild dry cough per her son. She also reported chest pain transiently the day prior to her admit. She had some mild nausea, but no vomiting or diarrhea.   In ED, she was found to have pulmonary edema on chest x-ray, potassium 6.7, troponin elevation at 0.70, temperature 97.9. She was given insulin, kayexalate, sodium bicarbonate in ED. Cardiology was consulted. Renal was consulted for urgent dialysis  HPI/Subjective: Came back by room to speak to family yesterday afternoon.  No family present at time of exam today.  Pt developed recurrent resp distress last night.  Per the RN it appears to have responded to neb tx and IV morphine.  She presently is resting comfortably in no resp distress whatsoever.    Assessment/Plan:  Acute large anterior MI Cardiology following - medical tx for now - troponin appears to have peaked at 1.63 - currently on IV heparin - to consider viability stugy in AM to assess anterior wall   Acute systolic CHF due to ischemia - Pulmonary edema / volume overload EF 30-35% via TTE - tx w/ emergent HD w/ 5L removed - Nephrology considering dropping EDW  Hyperkalemia  Returning - appears she will need HD soon - per Nephrology   Transaminitis  likely "shock liver" due to MI vs/ passive congestion related to acute CHF - f/u in AM 11/30 - no  sx to suggest infection/stone  ESRD on HD (M/W/F) Dialyzed emergently 11/26>11/27 night - Nephrology following   HTN BP reasonably controlled at present - follow w/o change - Uriostegui term goal will be SBP closer to 120 - Nephrology considering drop in EDW  HLD Cont lipitor   Deaf / Legally blind Family reports her visual loss is relatively recent following opthalmologic procedures at [facility] - they also report she is supposed to be on eye gtts but that she has not been getting them for weeks - there are no documents available via Care Everywhere   Psychotic d/o NOS No evidence of agitation at this time   DM CBG currently reasonably controlled - follow w/o change today    Anemia of ESRD Hgb stable - epo and Fe per Nephrology   Code Status: FULL Family Communication: no family present at time of exam Disposition Plan: SDU  Consultants: Cardiology Nephrology  Procedures: TTE - 11/27 - EF 30-35% - dyskinesis and aneursymal deformity of the mid-apical, anteroseptal, anterior, inferior, inferoseptal, and apical myocardium; consistent with infarction in the distribution of the left anterior descending coronary artery - grade 2 DD  Antibiotics: none  DVT prophylaxis: IV heparin   Objective: Blood pressure 151/79, pulse 81, temperature 97.7 F (36.5 C), temperature source Oral, resp. rate 9, height 5\' 4"  (1.626 m), weight 83 kg (182 lb 15.7 oz), SpO2 100 %.  Intake/Output Summary (Last 24 hours) at 11/22/14 0931 Last data filed at 11/22/14 0900  Gross per 24 hour  Intake 1563.5 ml  Output    100 ml  Net 1463.5 ml   Exam: General: No acute respiratory distress - appears comfortable Lungs: Clear to auscultation bilaterally without wheezes or crackles Cardiovascular: Regular rate and rhythm without murmur gallop or rub  Abdomen: Nontender, nondistended, soft, bowel sounds positive, no rebound, no ascites, no appreciable mass Extremities: No significant cyanosis, clubbing;   Trace edema bilateral lower extremities  Data Reviewed: Basic Metabolic Panel:  Recent Labs Lab 11/19/14 2335 11/20/14 0412 11/21/14 0156 11/22/14 0210  NA 128*  --  134* 128*  K 6.7* 4.3 5.0 5.8*  CL 89*  --  93* 86*  CO2 21  --  26 24  GLUCOSE 260*  --  166* 170*  BUN 83*  --  45* 62*  CREATININE 6.65*  --  4.77* 6.34*  CALCIUM 9.0  --  9.0 8.8  PHOS  --   --  5.2* 5.4*    Liver Function Tests:  Recent Labs Lab 11/19/14 2335 11/21/14 0156 11/22/14 0210  AST 60*  --   --   ALT 68*  --   --   ALKPHOS 142*  --   --   BILITOT 0.3  --   --   PROT 8.6*  --   --   ALBUMIN 3.5 3.2* 3.4*   Coags:  Recent Labs Lab 11/20/14 0002  INR 1.04   CBC:  Recent Labs Lab 11/19/14 2335 11/20/14 0245 11/21/14 0156 11/22/14 0210  WBC 10.1 9.1 8.7 11.6*  HGB 9.6* 9.6* 9.0* 9.0*  HCT 29.1* 29.9* 27.7* 28.1*  MCV 93.6 94.6 97.2 94.6  PLT 272 235 251 266    Cardiac Enzymes:  Recent Labs Lab 11/20/14 0229 11/20/14 1439 11/20/14 1918 11/21/14 0156  TROPONINI 0.42* 1.63* 1.30* 0.81*   BNP (last 3 results)  Recent Labs  11/19/14 2335  PROBNP 19448.0*    CBG:  Recent Labs Lab 11/21/14 1210 11/21/14 1643 11/21/14 2017 11/21/14 2148 11/22/14 0754  GLUCAP 137* 123* 178* 183* 99    Recent Results (from the past 240 hour(s))  MRSA PCR Screening     Status: None   Collection Time: 11/20/14  9:05 AM  Result Value Ref Range Status   MRSA by PCR NEGATIVE NEGATIVE Final    Comment:        The GeneXpert MRSA Assay (FDA approved for NASAL specimens only), is one component of a comprehensive MRSA colonization surveillance program. It is not intended to diagnose MRSA infection nor to guide or monitor treatment for MRSA infections.      Studies:  Recent x-ray studies have been reviewed in detail by the Attending Physician  Scheduled Meds:  Scheduled Meds: . aspirin  81 mg Oral Daily  . atorvastatin  80 mg Oral Q2000  . calcium acetate  667 mg  Oral TID WC  . [START ON 11/24/2014] cloNIDine  0.2 mg Transdermal Weekly  . [START ON 11/23/2014] darbepoetin (ARANESP) injection - DIALYSIS  40 mcg Intravenous Q Mon-HD  . docusate sodium  100 mg Oral BID  . doxercalciferol  1 mcg Oral Q M,W,F-HD  . [START ON 11/23/2014] ferric gluconate (FERRLECIT/NULECIT) IV  125 mg Intravenous Q Mon-HD  . hydrALAZINE  100 mg Oral 3 times per day  . insulin aspart  0-9 Units Subcutaneous TID WC  . isosorbide mononitrate  30 mg Oral Daily  . metoprolol tartrate  25 mg Oral BID  . multivitamin  1 tablet Oral QHS  . [START ON 11/29/2014] risperiDONE microspheres  25  mg Intramuscular Q14 Days    Time spent on care of this patient: 35 mins   Dola Lunsford T , MD   [facility] Office  [phone] Pager - Text Page per Shea Evans as per below:  On-Call/Text Page:      [email or web address]      password [email or web address]  If 7PM-7AM, please contact night-coverage [email or web address] Password [email or web address] 11/22/2014, 9:31 AM   LOS: 3 days

## 2014-11-22 NOTE — Progress Notes (Signed)
ANTICOAGULATION CONSULT NOTE - Initial Consult  Pharmacy Consult for Heparin  Indication: chest pain/ACS  No Known Allergies  Patient Measurements: Height: 5\' 4"  (162.6 cm) Weight: 182 lb 15.7 oz (83 kg) IBW/kg (Calculated) : 54.7  Vital Signs: Temp: 98.2 F (36.8 C) (11/29 1134) Temp Source: Oral (11/29 1134) BP: 145/94 mmHg (11/29 1134) Pulse Rate: 79 (11/29 1134)  Labs:  Recent Labs  11/19/14 2335 11/20/14 0002  11/20/14 0245  11/20/14 1439 11/20/14 1918  11/21/14 0156  11/21/14 2253 11/22/14 0129 11/22/14 0210 11/22/14 1134  HGB 9.6*  --   --  9.6*  --   --   --   --  9.0*  --   --   --  9.0*  --   HCT 29.1*  --   --  29.9*  --   --   --   --  27.7*  --   --   --  28.1*  --   PLT 272  --   --  235  --   --   --   --  251  --   --   --  266  --   LABPROT  --  13.7  --   --   --   --   --   --   --   --   --   --   --   --   INR  --  1.04  --   --   --   --   --   --   --   --   --   --   --   --   HEPARINUNFRC  --   --   --   --   < >  --   --   < > <0.10*  < > 1.78* 0.29*  --  0.52  CREATININE 6.65*  --   --   --   --   --   --   --  4.77*  --   --   --  6.34*  --   TROPONINI  --   --   < >  --   --  1.63* 1.30*  --  0.81*  --   --   --   --   --   < > = values in this interval not displayed.  Estimated Creatinine Clearance: 10.1 mL/min (by C-G formula based on Cr of 6.34).   Medical History: Past Medical History  Diagnosis Date  . Hyperlipidemia   . Hypertension     a. Has previously refused blood pressure meds.   . Deaf     Divorced from husband but lives with him. Has daughter but she does not care for her.  . Bilateral leg edema     a. Chronic.  Marland Kitchen Psychiatric disorder     She frequently exhibits paranoia and has been diagnosed with psychotic d/o NOS during hospital stay in the past. She is tangetial and perseverative during her visits. She apparently has had a bad experience with mental health in [place] in the past and refuses to discuss mental issues for  fear that she will be sent back there. Her paranoia, communication issues, financial woes and lack of fam  . Fibroid uterus   . Anemia     Due to fibroids.  . Heart murmur   . Diabetes mellitus     a. Per PCP note 2012 (A1C 9.2) - pt unwilling to take meds  and was educated on risk of uncontrolled DM.  b. A1C 5.9 in 11/2013.   Assessment: 58 y/o F from NH with resp distress/CP to start heparin. Heparin is now therapeutic. Plan to cont for 72 hrs then consider dc per cards.   Goal of Therapy:  Heparin level 0.3-0.7 units/ml Monitor platelets by anticoagulation protocol: Yes   Plan:   Cont heparin at 1400 units/hr Daily CBC/HL  Onnie Boer, PharmD Pager: [phone] 11/22/2014 12:37 PM

## 2014-11-23 DIAGNOSIS — N186 End stage renal disease: Secondary | ICD-10-CM | POA: Diagnosis not present

## 2014-11-23 DIAGNOSIS — Z992 Dependence on renal dialysis: Secondary | ICD-10-CM | POA: Diagnosis not present

## 2014-11-23 LAB — HEPATIC FUNCTION PANEL
ALT: 26 U/L (ref 0–35)
AST: 12 U/L (ref 0–37)
Albumin: 3.1 g/dL — ABNORMAL LOW (ref 3.5–5.2)
Alkaline Phosphatase: 96 U/L (ref 39–117)
Bilirubin, Direct: <0.2 mg/dL (ref 0.0–0.3)
Total Bilirubin: 0.4 mg/dL (ref 0.3–1.2)
Total Protein: 7.4 g/dL (ref 6.0–8.3)

## 2014-11-23 LAB — RENAL FUNCTION PANEL
Albumin: 3.2 g/dL — ABNORMAL LOW (ref 3.5–5.2)
Albumin: 3.4 g/dL — ABNORMAL LOW (ref 3.5–5.2)
Anion gap: 17 — ABNORMAL HIGH (ref 5–15)
Anion gap: 18 — ABNORMAL HIGH (ref 5–15)
BUN: 75 mg/dL — ABNORMAL HIGH (ref 6–23)
BUN: 78 mg/dL — ABNORMAL HIGH (ref 6–23)
CO2: 24 mEq/L (ref 19–32)
CO2: 23 mEq/L (ref 19–32)
Calcium: 8.8 mg/dL (ref 8.4–10.5)
Calcium: 8.9 mg/dL (ref 8.4–10.5)
Chloride: 85 mEq/L — ABNORMAL LOW (ref 96–112)
Chloride: 84 mEq/L — ABNORMAL LOW (ref 96–112)
Creatinine, Ser: 7.05 mg/dL — ABNORMAL HIGH (ref 0.50–1.10)
Creatinine, Ser: 7.02 mg/dL — ABNORMAL HIGH (ref 0.50–1.10)
GFR calc Af Amer: 7 mL/min — ABNORMAL LOW (ref >90)
GFR calc Af Amer: 7 mL/min — ABNORMAL LOW (ref >90)
GFR calc non Af Amer: 6 mL/min — ABNORMAL LOW (ref >90)
GFR calc non Af Amer: 6 mL/min — ABNORMAL LOW (ref >90)
Glucose, Bld: 118 mg/dL — ABNORMAL HIGH (ref 70–99)
Glucose, Bld: 136 mg/dL — ABNORMAL HIGH (ref 70–99)
Phosphorus: 7.4 mg/dL — ABNORMAL HIGH (ref 2.3–4.6)
Phosphorus: 7.1 mg/dL — ABNORMAL HIGH (ref 2.3–4.6)
Potassium: 5.9 mEq/L — ABNORMAL HIGH (ref 3.7–5.3)
Potassium: 5.4 mEq/L — ABNORMAL HIGH (ref 3.7–5.3)
Sodium: 126 mEq/L — ABNORMAL LOW (ref 137–147)
Sodium: 125 mEq/L — ABNORMAL LOW (ref 137–147)

## 2014-11-23 LAB — GLUCOSE, CAPILLARY
Glucose-Capillary: 115 mg/dL — ABNORMAL HIGH (ref 70–99)
Glucose-Capillary: 110 mg/dL — ABNORMAL HIGH (ref 70–99)

## 2014-11-23 LAB — CBC
HCT: 26.6 % — ABNORMAL LOW (ref 36.0–46.0)
Hemoglobin: 8.5 g/dL — ABNORMAL LOW (ref 12.0–15.0)
MCH: 30 pg (ref 26.0–34.0)
MCHC: 32 g/dL (ref 30.0–36.0)
MCV: 94 fL (ref 78.0–100.0)
Platelets: 247 10*3/uL (ref 150–400)
RBC: 2.83 MIL/uL — ABNORMAL LOW (ref 3.87–5.11)
RDW: 13.3 % (ref 11.5–15.5)
WBC: 8.4 10*3/uL (ref 4.0–10.5)

## 2014-11-23 LAB — HEPARIN LEVEL (UNFRACTIONATED): Heparin Unfractionated: 0.4 IU/mL (ref 0.30–0.70)

## 2014-11-23 MED ORDER — ALTEPLASE 2 MG IJ SOLR
2.0000 mg | Freq: Once | INTRAMUSCULAR | Status: DC | PRN
  Filled 2014-11-23: qty 2

## 2014-11-23 MED ORDER — HEPARIN SODIUM (PORCINE) 5000 UNIT/ML IJ SOLN
5000.0000 [IU] | Freq: Three times a day (TID) | INTRAMUSCULAR | Status: DC
  Administered 2014-11-23 – 2014-11-26 (×6): 5000 [IU] via SUBCUTANEOUS
  Filled 2014-11-23 (×11): qty 1

## 2014-11-23 MED ORDER — NEPRO/CARBSTEADY PO LIQD
237.0000 mL | ORAL | Status: DC | PRN
  Filled 2014-11-23: qty 237

## 2014-11-23 MED ORDER — SODIUM CHLORIDE 0.9 % IV SOLN: 100.0000 mL | INTRAVENOUS | Status: DC | PRN

## 2014-11-23 MED ORDER — LIDOCAINE HCL (PF) 1 % IJ SOLN: 5.0000 mL | INTRAMUSCULAR | Status: DC | PRN

## 2014-11-23 MED ORDER — DOXERCALCIFEROL 4 MCG/2ML IV SOLN
INTRAVENOUS | Status: AC
  Administered 2014-11-23: 4 ug
  Filled 2014-11-23: qty 2

## 2014-11-23 MED ORDER — PENTAFLUOROPROP-TETRAFLUOROETH EX AERO: 1.0000 "application " | INHALATION_SPRAY | CUTANEOUS | Status: DC | PRN

## 2014-11-23 MED ORDER — HEPARIN SODIUM (PORCINE) 1000 UNIT/ML DIALYSIS: 7000.0000 [IU] | Freq: Once | INTRAMUSCULAR | Status: DC

## 2014-11-23 MED ORDER — DARBEPOETIN ALFA 40 MCG/0.4ML IJ SOSY
PREFILLED_SYRINGE | INTRAMUSCULAR | Status: AC
  Administered 2014-11-23: 40 ug via INTRAVENOUS
  Filled 2014-11-23: qty 0.4

## 2014-11-23 MED ORDER — LIDOCAINE-PRILOCAINE 2.5-2.5 % EX CREA
1.0000 "application " | TOPICAL_CREAM | CUTANEOUS | Status: DC | PRN
  Filled 2014-11-23: qty 5

## 2014-11-23 MED ORDER — HEPARIN SODIUM (PORCINE) 1000 UNIT/ML DIALYSIS: 1000.0000 [IU] | INTRAMUSCULAR | Status: DC | PRN

## 2014-11-23 MED ORDER — ISOSORBIDE MONONITRATE ER 60 MG PO TB24
60.0000 mg | ORAL_TABLET | Freq: Every day | ORAL | Status: DC
  Administered 2014-11-25 – 2014-11-26 (×2): 60 mg via ORAL
  Filled 2014-11-23 (×3): qty 1

## 2014-11-23 NOTE — Progress Notes (Signed)
[facility] TEAM 1 - Stepdown/ICU TEAM Progress Note  [name] [id number] DOB: 16-Mar-1956 DOA: 11/19/2014 PCP: No primary care provider on file.  Admit HPI / Brief Narrative: 58 y.o. female with past medical history of end-stage renal disease on dialysis (M/W/F), deafness, blindness, diabetes mellitus, anemia, hypertension, and hyperlipidemia who presented with shortness of breath and chest pain.  Patient is from a nursing home. History was obtained from patient via sign language translator, and also from her son and daughter. They stated the patient had worsening shortness of breath over 2 weeks. She had been having wheezing and mild dry cough per her son. She also reported chest pain transiently the day prior to her admit. She had some mild nausea, but no vomiting or diarrhea.   In ED, she was found to have pulmonary edema on chest x-ray, potassium 6.7, troponin elevation at 0.70, temperature 97.9. She was given insulin, kayexalate, sodium bicarbonate in ED. Cardiology was consulted. Renal was consulted for urgent dialysis  HPI/Subjective: No family present at time of exam today.  Pt is seen post HD and is resting comfortably.  Respirations are much more comfortable.  No evidence of distress at this time.    Assessment/Plan:  Acute large anterior MI Cardiology following - medical tx for now - troponin appears to have peaked at 1.63 - Cardiology stopping IV heparin today - to have nuc med study to assess anterior wall   Acute systolic CHF due to ischemia - Pulmonary edema / volume overload EF 30-35% via TTE - tx w/ emergent HD w/ 5L removed - Nephrology considering dropping EDW - much improved post HD today   Hyperkalemia  per Nephrology   Transaminitis  likely "shock liver" due to MI vs/ passive congestion related to acute CHF - LFTs normalized 11/30   ESRD on HD (M/W/F) Dialyzed emergently 11/26>11/27 night - Nephrology following   HTN BP reasonably controlled at present  - follow w/o change - [place] term goal will be SBP closer to 120 - Nephrology considering drop in EDW  HLD Cont lipitor   Deaf / Legally blind Family reports her visual loss is relatively recent following opthalmologic procedures at [facility] - they also report she is supposed to be on eye gtts but that she has not been getting them for weeks - there are no documents available via Care Everywhere   Psychotic d/o NOS No evidence of agitation at this time   DM CBG currently reasonably controlled - follow w/o change today    Anemia of ESRD Hgb stable - epo and Fe per Nephrology   Code Status: FULL Family Communication: no family present at time of exam Disposition Plan: stable for transfer to tele bed   Consultants: Cardiology Nephrology  Procedures: TTE - 11/27 - EF 30-35% - dyskinesis and aneursymal deformity of the mid-apical, anteroseptal, anterior, inferior, inferoseptal, and apical myocardium; consistent with infarction in the distribution of the left anterior descending coronary artery - grade 2 DD  Antibiotics: none  DVT prophylaxis: SQ heparin   Objective: Blood pressure 165/85, pulse 92, temperature 97.8 F (36.6 C), temperature source Oral, resp. rate 12, height 5\' 4"  (1.626 m), weight 85.9 kg (189 lb 6 oz), SpO2 100 %.  Intake/Output Summary (Last 24 hours) at 11/23/14 1139 Last data filed at 11/23/14 0600  Gross per 24 hour  Intake   1060 ml  Output    225 ml  Net    835 ml   Exam: General: No acute respiratory  distress - appears comfortable Lungs: Clear to auscultation bilaterally without wheezes or crackles Cardiovascular: Regular rate and rhythm without murmur gallop or rub  Abdomen: Nontender, nondistended, soft, bowel sounds positive, no rebound, no ascites, no appreciable mass Extremities: No significant cyanosis, clubbing, edema bilateral lower extremities  Data Reviewed: Basic Metabolic Panel:  Recent Labs Lab 11/19/14 2335 11/20/14 0412  11/21/14 0156 11/22/14 0210 11/23/14 0329 11/23/14 0906  NA 128*  --  134* 128* 126* 125*  K 6.7* 4.3 5.0 5.8* 5.9* 5.4*  CL 89*  --  93* 86* 85* 84*  CO2 21  --  26 24 24 23   GLUCOSE 260*  --  166* 170* 118* 136*  BUN 83*  --  45* 62* 75* 78*  CREATININE 6.65*  --  4.77* 6.34* 7.05* 7.02*  CALCIUM 9.0  --  9.0 8.8 8.8 8.9  PHOS  --   --  5.2* 5.4* 7.4* 7.1*    Liver Function Tests:  Recent Labs Lab 11/19/14 2335 11/21/14 0156 11/22/14 0210 11/23/14 0329 11/23/14 0906  AST 60*  --   --  12  --   ALT 68*  --   --  26  --   ALKPHOS 142*  --   --  96  --   BILITOT 0.3  --   --  0.4  --   PROT 8.6*  --   --  7.4  --   ALBUMIN 3.5 3.2* 3.4* 3.1*  3.2* 3.4*   Coags:  Recent Labs Lab 11/20/14 0002  INR 1.04   CBC:  Recent Labs Lab 11/19/14 2335 11/20/14 0245 11/21/14 0156 11/22/14 0210 11/23/14 0329  WBC 10.1 9.1 8.7 11.6* 8.4  HGB 9.6* 9.6* 9.0* 9.0* 8.5*  HCT 29.1* 29.9* 27.7* 28.1* 26.6*  MCV 93.6 94.6 97.2 94.6 94.0  PLT 272 235 251 266 247    Cardiac Enzymes:  Recent Labs Lab 11/20/14 0229 11/20/14 1439 11/20/14 1918 11/21/14 0156  TROPONINI 0.42* 1.63* 1.30* 0.81*   BNP (last 3 results)  Recent Labs  11/19/14 2335  PROBNP 19448.0*    CBG:  Recent Labs Lab 11/21/14 1643 11/21/14 2017 11/21/14 2148 11/22/14 0754 11/23/14 0758  GLUCAP 123* 178* 183* 99 115*    Recent Results (from the past 240 hour(s))  MRSA PCR Screening     Status: None   Collection Time: 11/20/14  9:05 AM  Result Value Ref Range Status   MRSA by PCR NEGATIVE NEGATIVE Final    Comment:        The GeneXpert MRSA Assay (FDA approved for NASAL specimens only), is one component of a comprehensive MRSA colonization surveillance program. It is not intended to diagnose MRSA infection nor to guide or monitor treatment for MRSA infections.      Studies:  Recent x-ray studies have been reviewed in detail by the Attending Physician  Scheduled  Meds:  Scheduled Meds: . aspirin  81 mg Oral Daily  . atorvastatin  80 mg Oral Q2000  . calcium acetate  667 mg Oral TID WC  . [START ON 11/24/2014] cloNIDine  0.2 mg Transdermal Weekly  . darbepoetin (ARANESP) injection - DIALYSIS  40 mcg Intravenous Q Mon-HD  . docusate sodium  100 mg Oral BID  . doxercalciferol  1 mcg Oral Q M,W,F-HD  . ferric gluconate (FERRLECIT/NULECIT) IV  125 mg Intravenous Q Mon-HD  . [START ON 11/24/2014] heparin  7,000 Units Dialysis Once in dialysis  . hydrALAZINE  100 mg Oral 3 times per day  .  insulin aspart  0-9 Units Subcutaneous TID WC  . isosorbide mononitrate  30 mg Oral Daily  . metoprolol tartrate  25 mg Oral BID  . multivitamin  1 tablet Oral QHS  . [START ON 11/29/2014] risperiDONE microspheres  25 mg Intramuscular Q14 Days    Time spent on care of this patient: 25 mins   [facility] T , MD   [facility] Office  [phone] Pager - Text Page per Shea Evans as per below:  On-Call/Text Page:      [email or web address]      password [email or web address]  If 7PM-7AM, please contact night-coverage [email or web address] Password [email or web address] 11/23/2014, 11:39 AM   LOS: 4 days

## 2014-11-23 NOTE — Progress Notes (Signed)
PT Cancellation Note  Patient Details Name: [name] MRN: [id number] DOB: [birth year]   Cancelled Treatment:    Reason Eval/Treat Not Completed: Patient at procedure or test/unavailable (Pt in HD).Will try again later.   Jemery Stacey 11/23/2014, 10:34 AM  [place]

## 2014-11-23 NOTE — Progress Notes (Signed)
  [facility] Progress Note   Subjective: looks SOB  Filed Vitals:   11/23/14 0300 11/23/14 0400 11/23/14 0600 11/23/14 0754  BP: 148/71 137/63 156/72 176/67  Pulse: 80 78    Temp: 98.2 F (36.8 C)   98.1 F (36.7 C)  TempSrc: Oral   Oral  Resp: 13 10 12 18   Height:      Weight: 85.911 kg (189 lb 6.4 oz)     SpO2: 100% 100%  100%   Exam: Diaphoretic, mild ^WOB +JVD Chest w diffuse exp wheezes, bibasilar insp rales 1/3 up RRR no MRG Abd soft, NTND, +BS 1+ LE edema bilat Neuro nonfocal, mute LUA AVF +bruit  HD: [facility] 4h 41min   80.5kg  2/2.5 [place]   Heparin 9000  AVF LUA Aranesp 25 ug/wk,  Hect 1 mcg TIW, Venofer 100 / wk        Assessment: 1. Pulm edema - in mild distress now, wt's are up not sure how. Was below dry wt on admit, 5kg off w HD on Fri,  now wet again and up 5 kg by wts 2. ESRD needs lower dry wt 3. S/P MI - large WMA ant wall, per cardiology 4. HTN cont meds 5. Anemia of ESRD - ^'d aranesp 40 6. MBD- cont meds 7. DM per primary  Plan- HD now and again tomorrow, limit fluid intake    Kelly Splinter MD  pager [phone]    cell [phone]  11/23/2014, 8:36 AM     Recent Labs Lab 11/21/14 0156 11/22/14 0210 11/23/14 0329  NA 134* 128* 126*  K 5.0 5.8* 5.9*  CL 93* 86* 85*  CO2 26 24 24   GLUCOSE 166* 170* 118*  BUN 45* 62* 75*  CREATININE 4.77* 6.34* 7.05*  CALCIUM 9.0 8.8 8.8  PHOS 5.2* 5.4* 7.4*    Recent Labs Lab 11/19/14 2335 11/21/14 0156 11/22/14 0210 11/23/14 0329  AST 60*  --   --  12  ALT 68*  --   --  26  ALKPHOS 142*  --   --  96  BILITOT 0.3  --   --  0.4  PROT 8.6*  --   --  7.4  ALBUMIN 3.5 3.2* 3.4* 3.1*  3.2*    Recent Labs Lab 11/21/14 0156 11/22/14 0210 11/23/14 0329  WBC 8.7 11.6* 8.4  HGB 9.0* 9.0* 8.5*  HCT 27.7* 28.1* 26.6*  MCV 97.2 94.6 94.0  PLT 251 266 247   . aspirin  81 mg Oral Daily  . atorvastatin  80 mg Oral Q2000  . calcium acetate  667 mg Oral TID WC  . [START ON  11/24/2014] cloNIDine  0.2 mg Transdermal Weekly  . darbepoetin (ARANESP) injection - DIALYSIS  40 mcg Intravenous Q Mon-HD  . docusate sodium  100 mg Oral BID  . doxercalciferol  1 mcg Oral Q M,W,F-HD  . ferric gluconate (FERRLECIT/NULECIT) IV  125 mg Intravenous Q Mon-HD  . hydrALAZINE  100 mg Oral 3 times per day  . insulin aspart  0-9 Units Subcutaneous TID WC  . isosorbide mononitrate  30 mg Oral Daily  . metoprolol tartrate  25 mg Oral BID  . multivitamin  1 tablet Oral QHS  . [START ON 11/29/2014] risperiDONE microspheres  25 mg Intramuscular Q14 Days   . heparin 1,400 Units/hr (11/23/14 0600)   bisacodyl, lactulose, levalbuterol, morphine injection, naphazoline-pheniramine, nitroGLYCERIN, ondansetron (ZOFRAN) IV, promethazine

## 2014-11-23 NOTE — Care Management Note (Addendum)
    Page 1 of 1   11/26/2014     12:17:14 PM CARE MANAGEMENT NOTE 11/26/2014  Patient:  [name], [name]   Account Number:  [id number]  Date Initiated:  11/23/2014  Documentation initiated by:  Elissa Hefty  Subjective/Objective Assessment:   adm w resp failure, on esrd-hd     Action/Plan:   from nsg facility   Anticipated DC Date:  11/25/2014   Anticipated DC Plan:  [place]  In-house referral  Clinical Social Worker         Choice offered to / List presented to:             Status of service:  Completed, signed off Medicare Important Message given?  YES (If response is "NO", the following Medicare IM given date fields will be blank) Date Medicare IM given:  11/23/2014 Medicare IM given by:  Elissa Hefty Date Additional Medicare IM given:  11/25/2014 Additional Medicare IM given by:  Doloros Kwolek  Discharge Disposition:  [place]  Per UR Regulation:  Reviewed for med. necessity/level of care/duration of stay  If discussed at [place] of Stay Meetings, dates discussed:   11/24/2014  11/26/2014    Comments:  Mariann Laster RN, BSN, MSHL, CCM  Nurse - Case Manager,  ([facility])  [phone]  11/26/2014 Social:  from SNF/[facility].  Patient is deaf/blind/mute HD: M-W-F PT/OT RECS:  SNF Dispo Plan:  Return to SNF/[facility] via ambulance transport

## 2014-11-23 NOTE — Plan of Care (Signed)
Problem: Phase I Progression Outcomes Goal: Dyspnea controlled at rest Outcome: Completed/Met Date Met:  11/23/14 Goal: Pain controlled Outcome: Completed/Met Date Met:  11/23/14

## 2014-11-23 NOTE — Progress Notes (Addendum)
TELEMETRY: Reviewed telemetry pt in NSR: Filed Vitals:   11/23/14 1000 11/23/14 1030 11/23/14 1058 11/23/14 1130  BP: 158/74 165/85 165/85 169/80  Pulse: 95 91 92 92  Temp:      TempSrc:      Resp: 12 10 12 9   Height:      Weight:      SpO2:        Intake/Output Summary (Last 24 hours) at 11/23/14 1150 Last data filed at 11/23/14 0800  Gross per 24 hour  Intake   1088 ml  Output    225 ml  Net    863 ml   Filed Weights   11/22/14 0300 11/23/14 0300 11/23/14 0857  Weight: 182 lb 15.7 oz (83 kg) 189 lb 6.4 oz (85.911 kg) 189 lb 6 oz (85.9 kg)    Subjective Unable to communicate. Appears comfortable on dialysis.  Marland Kitchen aspirin  81 mg Oral Daily  . atorvastatin  80 mg Oral Q2000  . calcium acetate  667 mg Oral TID WC  . [START ON 11/24/2014] cloNIDine  0.2 mg Transdermal Weekly  . darbepoetin (ARANESP) injection - DIALYSIS  40 mcg Intravenous Q Mon-HD  . docusate sodium  100 mg Oral BID  . doxercalciferol  1 mcg Oral Q M,W,F-HD  . ferric gluconate (FERRLECIT/NULECIT) IV  125 mg Intravenous Q Mon-HD  . [START ON 11/24/2014] heparin  7,000 Units Dialysis Once in dialysis  . hydrALAZINE  100 mg Oral 3 times per day  . insulin aspart  0-9 Units Subcutaneous TID WC  . isosorbide mononitrate  30 mg Oral Daily  . metoprolol tartrate  25 mg Oral BID  . multivitamin  1 tablet Oral QHS  . [START ON 11/29/2014] risperiDONE microspheres  25 mg Intramuscular Q14 Days   . heparin 1,400 Units/hr (11/23/14 0600)    LABS: Basic Metabolic Panel:  Recent Labs  11/23/14 0329 11/23/14 0906  NA 126* 125*  K 5.9* 5.4*  CL 85* 84*  CO2 24 23  GLUCOSE 118* 136*  BUN 75* 78*  CREATININE 7.05* 7.02*  CALCIUM 8.8 8.9  PHOS 7.4* 7.1*   Liver Function Tests:  Recent Labs  11/23/14 0329 11/23/14 0906  AST 12  --   ALT 26  --   ALKPHOS 96  --   BILITOT 0.4  --   PROT 7.4  --   ALBUMIN 3.1*  3.2* 3.4*   No results for input(s): LIPASE, AMYLASE in the last 72  hours. CBC:  Recent Labs  11/22/14 0210 11/23/14 0329  WBC 11.6* 8.4  HGB 9.0* 8.5*  HCT 28.1* 26.6*  MCV 94.6 94.0  PLT 266 247   Cardiac Enzymes:  Recent Labs  11/20/14 1439 11/20/14 1918 11/21/14 0156  TROPONINI 1.63* 1.30* 0.81*   Hemoglobin A1C:  Recent Labs  11/21/14 0156  HGBA1C 6.0*   Fasting Lipid Panel: No results for input(s): CHOL, HDL, LDLCALC, TRIG, CHOLHDL, LDLDIRECT in the last 72 hours. Thyroid Function Tests: No results for input(s): TSH, T4TOTAL, T3FREE, THYROIDAB in the last 72 hours.  Invalid input(s): FREET3   Radiology/Studies:  CHEST 2 VIEW  COMPARISON: 01/02/2014.  FINDINGS: Mild cardiac enlargement. Small bilateral pleural effusions and mild to moderate interstitial edema is identified. No focal airspace consolidation identified. Spondylosis noted within the thoracic spine.  IMPRESSION: 1. Mild to moderate CHF.   Electronically Signed By: Kerby Moors M.D. On: 11/20/2014 00:34      Ecg: NSR rate 106. Anteroseptal infarct.  Echo: Study Conclusions  -  Left ventricle: The cavity size was normal. There was moderate concentric hypertrophy. Systolic function was moderately to severely reduced. The estimated ejection fraction was in the range of 30% to 35%. Dyskinesis and aneurysmal deformity of the mid-apicalanteroseptal, anterior, inferior, inferoseptal, and apical myocardium; consistent with infarction in the distribution of the left anterior descending coronary artery. Features are consistent with a pseudonormal left ventricular filling pattern, with concomitant abnormal relaxation and increased filling pressure (grade 2 diastolic dysfunction). - Mitral valve: Severely calcified annulus. - Left atrium: The atrium was moderately dilated. - Pulmonary arteries: Systolic pressure was mildly increased. PA peak pressure: 44 mm Hg (S).  Impressions:  - Compared to January 2015 there is a new  severe and extensive wall motion abnormality consistent with infarction in the LAD artery distribution. LV systolic function has decreased substantially.   PHYSICAL EXAM General: Well developed, obese, in no acute distress. Head: Normocephalic, atraumatic, sclera non-icteric, oropharynx is clear Neck: Negative for carotid bruits. JVD not elevated. No adenopathy Lungs: Clear bilaterally with minimal crackles. Breathing is unlabored. Heart: RRR S1 S2 without murmurs, rubs, or gallops.  Abdomen: Soft, non-tender, non-distended with normoactive bowel sounds. No hepatomegaly. No rebound/guarding. No obvious abdominal masses. Extremities: No clubbing, cyanosis or edema.  Distal pedal pulses are 2+ and equal bilaterally. Neuro: Patient is deaf, mute, and blind.   ASSESSMENT AND PLAN: 1. Subacute anterior MI. Late presentation. troponins minimally elevated considering degree of Ecg changes and Echo findings. EF 30-35%. Continue ASA, lipitor, beta blocker and isosorbide. Recommend nuclear viability study to assess ischemic burden. Will DC IV heparin and switch to SQ. OK to transfer to telemetry today. 2. ESRD on dialysis 3. HTN controlled. 4. Hyperlipidemia on statin 5. Ischemic cardiomyopathy. On beta blocker, nitrates, hydralazine. EF 30-35% 6. 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   Present on Admission:  . NSTEMI (non-ST elevated myocardial infarction) . Essential hypertension . HLD (hyperlipidemia) . 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 Chronic diastolic CHF (congestive heart failure) . Anemia . Acute respiratory failure with hypoxia . Acute respiratory failure . Hyperkalemia  Signed, [name], [place] 11/23/2014 11:50 AM

## 2014-11-23 NOTE — Progress Notes (Signed)
ANTICOAGULATION CONSULT NOTE - Initial Consult  Pharmacy Consult for Heparin  Indication: chest pain/ACS  No Known Allergies  Patient Measurements: Height: 5\' 4"  (162.6 cm) Weight: 189 lb 6.4 oz (85.911 kg) IBW/kg (Calculated) : 54.7  Vital Signs: Temp: 98.2 F (36.8 C) (11/30 0300) Temp Source: Oral (11/30 0300) BP: 156/72 mmHg (11/30 0600) Pulse Rate: 78 (11/30 0400)  Labs:  Recent Labs  11/20/14 1439 11/20/14 1918  11/21/14 0156  11/22/14 0129 11/22/14 0210 11/22/14 1134 11/23/14 0329  HGB  --   --   < > 9.0*  --   --  9.0*  --  8.5*  HCT  --   --   --  27.7*  --   --  28.1*  --  26.6*  PLT  --   --   --  251  --   --  266  --  247  HEPARINUNFRC  --   --   < > <0.10*  < > 0.29*  --  0.52 0.40  CREATININE  --   --   --  4.77*  --   --  6.34*  --  7.05*  TROPONINI 1.63* 1.30*  --  0.81*  --   --   --   --   --   < > = values in this interval not displayed.  Estimated Creatinine Clearance: 9.2 mL/min (by C-G formula based on Cr of 7.05).   Medical History: Past Medical History  Diagnosis Date  . Hyperlipidemia   . Hypertension     a. Has previously refused blood pressure meds.   . Deaf     Divorced from husband but lives with him. Has daughter but she does not care for her.  . Bilateral leg edema     a. Chronic.  Marland Kitchen Psychiatric disorder     She frequently exhibits paranoia and has been diagnosed with psychotic d/o NOS during hospital stay in the past. She is tangetial and perseverative during her visits. She apparently has had a bad experience with mental health in [place] in the past and refuses to discuss mental issues for fear that she will be sent back there. Her paranoia, communication issues, financial woes and lack of fam  . Fibroid uterus   . Anemia     Due to fibroids.  . Heart murmur   . Diabetes mellitus     a. Per PCP note 2012 (A1C 9.2) - pt unwilling to take meds and was educated on risk of uncontrolled DM.  b. A1C 5.9 in 11/2013.   Assessment: 58  y/o F from NH with resp distress/CP to start heparin for late presentation anterior MI. Heparin remains therapeutic this am at 0.40 after 72 hours of treatment. Per cardiology, consider discontinuation after 72 hours. H/H remains low but patient is scheduled for Aranesp today and is receiving ferric gluconate treatment. Plt remain wnl and stable with no reported significant s/s bleeding.   Goal of Therapy:  Heparin level 0.3-0.7 units/ml Monitor platelets by anticoagulation protocol: Yes   Plan:  Cont heparin at 1400 units/hr Daily CBC/HL Consider d/c heparin gtt this am   Harolyn Rutherford, PharmD Clinical Pharmacist - Resident Pager: [phone] Pharmacy: [phone] 11/23/2014 7:43 AM

## 2014-11-23 NOTE — Progress Notes (Addendum)
Patient arrived on unit, transferred from [facility].  Vital signs stable.  Patient's hemodialysis fistula bleeding upon assessment, hemodialysis RN called to bedside; bleeding stopped.  Patient assisted in eating dinner by NT.  Will report to night RN, who will continue to monitor.

## 2014-11-23 NOTE — Evaluation (Signed)
Physical Therapy Evaluation Patient Details Name: LYNEAH VANDEBOGART MRN: [id number] DOB: 12-21-1956 Today's Date: 11/23/2014   History of Present Illness  58 y.o. female with past medical history of end-stage renal disease on dialysis (M/W/F), deafness, blindness, diabetes mellitus, anemia, hypertension, and hyperlipidemia who presented with shortness of breath and chest pain. Pt found to have large anterior wall MI.  Clinical Impression  Pt admitted with above. Pt currently with functional limitations due to the deficits listed below (see PT Problem List).  Pt will benefit from skilled PT to increase their independence and safety with mobility to allow discharge to the venue listed below. Working on scheduling sign language interpreter tomorrow at a time when pt isn't in HD.      Follow Up Recommendations SNF    Equipment Recommendations  None recommended by PT    Recommendations for Other Services       Precautions / Restrictions Precautions Precautions: Fall      Mobility  Bed Mobility Overal bed mobility: Needs Assistance Bed Mobility: Supine to Sit;Sit to Supine     Supine to sit: +2 for physical assistance;Mod assist Sit to supine: +2 for physical assistance;Mod assist   General bed mobility comments: Used tactile cues to initiate mobility. Assist to bring trunk up into sitting. Assist to control descent of trunk and bring legs back up into bed.  Transfers                    Ambulation/Gait                Stairs            Wheelchair Mobility    Modified Rankin (Stroke Patients Only)       Balance Overall balance assessment: Needs assistance Sitting-balance support: No upper extremity supported;Feet supported Sitting balance-Leahy Scale: Fair Sitting balance - Comments: Sat EOB x 5 minutes with supervision                                     Pertinent Vitals/Pain Pain Assessment: Faces Faces Pain Scale: No hurt     Home Living Family/patient expects to be discharged to:: Skilled nursing facility                 Additional Comments: Pt at [facility]    Prior Function Level of Independence: Needs assistance   Gait / Transfers Assistance Needed: During hospitalization in 12/2013 pt was amb with walker with assist. Unsure of status just prior to admission.           Hand Dominance   Dominant Hand: Right    Extremity/Trunk Assessment   Upper Extremity Assessment: Defer to OT evaluation           Lower Extremity Assessment: Generalized weakness         Communication   Communication: Deaf  Cognition Arousal/Alertness: Awake/alert Behavior During Therapy: WFL for tasks assessed/performed Overall Cognitive Status: Difficult to assess                      General Comments      Exercises        Assessment/Plan    PT Assessment Patient needs continued PT services  PT Diagnosis Difficulty walking;Generalized weakness   PT Problem List Decreased strength;Decreased activity tolerance;Decreased balance;Decreased mobility  PT Treatment Interventions DME instruction;Gait training;Functional mobility training;Therapeutic activities;Therapeutic exercise;Patient/family education;Balance training  PT Goals (Current goals can be found in the Care Plan section) Acute Rehab PT Goals Patient Stated Goal: Pt unable PT Goal Formulation: Patient unable to participate in goal setting Time For Goal Achievement: 11/30/14 Potential to Achieve Goals: Fair    Frequency Min 2X/week   Barriers to discharge        Co-evaluation               End of Session   Activity Tolerance: Patient tolerated treatment well Patient left: in bed;with call bell/phone within reach Nurse Communication: Mobility status         Time: [id number] PT Time Calculation (min) (ACUTE ONLY): 12 min   Charges:   PT Evaluation $Initial PT Evaluation Tier I: 1 Procedure      PT G Codes:          Gabbie Marzo 12-04-14, 2:38 PM  [facility] PT [phone]

## 2014-11-23 NOTE — Procedures (Signed)
I was present at this dialysis session, have reviewed the session itself and made  appropriate changes  Kelly Splinter MD (pgr) [phone]    [phone] 11/23/2014, 11:39 AM

## 2014-11-24 ENCOUNTER — Encounter (HOSPITAL_COMMUNITY): Payer: Medicare Other

## 2014-11-24 ENCOUNTER — Inpatient Hospital Stay (HOSPITAL_COMMUNITY): Payer: Medicare Other

## 2014-11-24 DIAGNOSIS — H547 Unspecified visual loss: Secondary | ICD-10-CM | POA: Diagnosis present

## 2014-11-24 DIAGNOSIS — I219 Acute myocardial infarction, unspecified: Secondary | ICD-10-CM

## 2014-11-24 DIAGNOSIS — H9190 Unspecified hearing loss, unspecified ear: Secondary | ICD-10-CM

## 2014-11-24 HISTORY — DX: Acute myocardial infarction, unspecified: I21.9

## 2014-11-24 LAB — GLUCOSE, CAPILLARY
Glucose-Capillary: 129 mg/dL — ABNORMAL HIGH (ref 70–99)
Glucose-Capillary: 110 mg/dL — ABNORMAL HIGH (ref 70–99)
Glucose-Capillary: 102 mg/dL — ABNORMAL HIGH (ref 70–99)
Glucose-Capillary: 156 mg/dL — ABNORMAL HIGH (ref 70–99)
Glucose-Capillary: 107 mg/dL — ABNORMAL HIGH (ref 70–99)

## 2014-11-24 LAB — RENAL FUNCTION PANEL
Albumin: 3 g/dL — ABNORMAL LOW (ref 3.5–5.2)
Anion gap: 15 (ref 5–15)
BUN: 40 mg/dL — ABNORMAL HIGH (ref 6–23)
CO2: 25 mEq/L (ref 19–32)
Calcium: 8.7 mg/dL (ref 8.4–10.5)
Chloride: 92 mEq/L — ABNORMAL LOW (ref 96–112)
Creatinine, Ser: 4.51 mg/dL — ABNORMAL HIGH (ref 0.50–1.10)
GFR calc Af Amer: 11 mL/min — ABNORMAL LOW (ref >90)
GFR calc non Af Amer: 10 mL/min — ABNORMAL LOW (ref >90)
Glucose, Bld: 99 mg/dL (ref 70–99)
Phosphorus: 6 mg/dL — ABNORMAL HIGH (ref 2.3–4.6)
Potassium: 4.4 mEq/L (ref 3.7–5.3)
Sodium: 132 mEq/L — ABNORMAL LOW (ref 137–147)

## 2014-11-24 LAB — CBC
HCT: 27.2 % — ABNORMAL LOW (ref 36.0–46.0)
Hemoglobin: 8.6 g/dL — ABNORMAL LOW (ref 12.0–15.0)
MCH: 30.5 pg (ref 26.0–34.0)
MCHC: 31.6 g/dL (ref 30.0–36.0)
MCV: 96.5 fL (ref 78.0–100.0)
Platelets: 233 10*3/uL (ref 150–400)
RBC: 2.82 MIL/uL — ABNORMAL LOW (ref 3.87–5.11)
RDW: 13.5 % (ref 11.5–15.5)
WBC: 7.2 10*3/uL (ref 4.0–10.5)

## 2014-11-24 MED ORDER — TECHNETIUM TC 99M SESTAMIBI GENERIC - CARDIOLITE
10.0000 | Freq: Once | INTRAVENOUS | Status: AC | PRN
  Administered 2014-11-24: 10 via INTRAVENOUS

## 2014-11-24 MED ORDER — REGADENOSON 0.4 MG/5ML IV SOLN: 0.4000 mg | Freq: Once | INTRAVENOUS | Status: DC

## 2014-11-24 MED ORDER — TECHNETIUM TC 99M SESTAMIBI GENERIC - CARDIOLITE
30.0000 | Freq: Once | INTRAVENOUS | Status: AC | PRN
  Administered 2014-11-24: 30 via INTRAVENOUS

## 2014-11-24 MED ORDER — REGADENOSON 0.4 MG/5ML IV SOLN
0.4000 mg | Freq: Once | INTRAVENOUS | Status: AC
  Administered 2014-11-24: 0.4 mg via INTRAVENOUS
  Filled 2014-11-24: qty 5

## 2014-11-24 MED ORDER — REGADENOSON 0.4 MG/5ML IV SOLN
INTRAVENOUS | Status: AC
  Administered 2014-11-24: 0.4 mg via INTRAVENOUS
  Filled 2014-11-24: qty 5

## 2014-11-24 NOTE — Progress Notes (Signed)
    Subjective:  On HD- in no distress  Objective:  Vital Signs in the last 24 hours: Temp:  [97.5 F (36.4 C)-98.3 F (36.8 C)] 98.3 F (36.8 C) (12/01 0759) Pulse Rate:  [84-96] 93 (12/01 1030) Resp:  [8-18] 9 (12/01 1030) BP: (123-183)/(59-89) 150/77 mmHg (12/01 1030) SpO2:  [99 %-100 %] 100 % (12/01 0759) Weight:  [179 lb 10.8 oz (81.5 kg)-182 lb 1.6 oz (82.6 kg)] 181 lb 3.5 oz (82.2 kg) (12/01 0759)  Intake/Output from previous day:  Intake/Output Summary (Last 24 hours) at 11/24/14 1045 Last data filed at 11/23/14 2100  Gross per 24 hour  Intake    120 ml  Output   3900 ml  Net  -3780 ml    Physical Exam: General appearance: alert and no distress Lungs: decreased breath sounds Heart: regular rate and rhythm and apical lift noted   Rate: 88  Rhythm: normal sinus rhythm  Lab Results:  Recent Labs  11/23/14 0329 11/24/14 0325  WBC 8.4 7.2  HGB 8.5* 8.6*  PLT 247 233    Recent Labs  11/23/14 0906 11/24/14 0325  NA 125* 132*  K 5.4* 4.4  CL 84* 92*  CO2 23 25  GLUCOSE 136* 99  BUN 78* 40*  CREATININE 7.02* 4.51*   No results for input(s): TROPONINI in the last 72 hours.  Invalid input(s): CK, MB No results for input(s): INR in the last 72 hours.  Imaging: Imaging results have been reviewed  Cardiac Studies: Echo 11/20/14 Study Conclusions  - Left ventricle: The cavity size was normal. There was moderate concentric hypertrophy. Systolic function was moderately to severely reduced. The estimated ejection fraction was in the range of 30% to 35%. Dyskinesis and aneurysmal deformity of the mid-apicalanteroseptal, anterior, inferior, inferoseptal, and apical myocardium; consistent with infarction in the distribution of the left anterior descending coronary artery. Features are consistent with a pseudonormal left ventricular filling pattern, with concomitant abnormal relaxation and increased filling pressure (grade 2 diastolic  dysfunction). - Mitral valve: Severely calcified annulus. - Left atrium: The atrium was moderately dilated. - Pulmonary arteries: Systolic pressure was mildly increased. PA peak pressure: 44 mm Hg (S).  Impressions:  - Compared to January 2015 there is a new severe and extensive wall motion abnormality consistent with infarction in the LAD artery distribution. LV systolic function has decreased substantially.   Assessment/Plan:  58 y.o. female with PM hx significant for DM, ESRD on HD MWF, HTN, hyperlipidemia, deafness, and blindness, admitted 11/19/14 with respiratory failure and suspected late presentation of an anterior wall myocardial infarction. ECG consistent with recent anterolateral STEMI. Echo revealed LVD with an EF of 30-35% and wall motion abnormalities consistent with LAD distribution infarct.    Principal Problem:    Acute respiratory failure with hypoxia  Active Problems:   Anterior wall infarct-(out of hopsital)   Cardiomyopathy, ischemic-EF 30-35%   Essential hypertension   Diabetes mellitus with ESRD (end-stage renal disease)   End stage renal disease on dialysis   HLD (hyperlipidemia)   Nonpsychotic mental disorder   Anemia   Deaf   Blind   PLAN:  Myoview today. She is on ASA, beta blocker, and statin.   Kerin Ransom PA-C Beeper [id number] 11/24/2014, 10:45 AM  As above, recent infarct with late presentation. For nuclear study today to exclude ischemia and evaluate viability. Otherwise continue medical therapy including aspirin, statin, hydralazine/nitrates and beta blocker. Kirk Ruths

## 2014-11-24 NOTE — Progress Notes (Signed)
Report received from Dialysis. Patient is on her way to to radiology for stress test.  Ave Filter, RN

## 2014-11-24 NOTE — Progress Notes (Signed)
Progress Note  ARIZONA CORNIER [id number] DOB: 03-15-1956 DOA: 11/19/2014 PCP: No primary care provider on file.  Admit HPI / Brief Narrative: 58 y.o. female with past medical history of end-stage renal disease on dialysis (M/W/F), deafness, blindness, diabetes mellitus, anemia, hypertension, and hyperlipidemia who presented with shortness of breath and chest pain.  Patient is from a nursing home. History was obtained from patient via sign language translator, and also from her son and daughter. They stated the patient had worsening shortness of breath over 2 weeks. She had been having wheezing and mild dry cough per her son. She also reported chest pain transiently the day prior to her admit. She had some mild nausea, but no vomiting or diarrhea.   In ED, she was found to have pulmonary edema on chest x-ray, potassium 6.7, troponin elevation at 0.70, temperature 97.9. She was given insulin, kayexalate, sodium bicarbonate in ED. Cardiology was consulted. Renal was consulted for urgent dialysis. Patient was diagnosed with non-ST elevated MI, started on IV heparin drip initially, which was stopped on 11/30.  HPI/Subjective: No family present at time of exam today.  Pt is seen post HD and is resting comfortably.  Respirations are much more comfortable.  No evidence of distress at this time.    Assessment/Plan:  Acute large anterior MI Cardiology following - medical tx for now - troponin appears to have peaked at 1.63 - Cardiology stopping IV heparin 11/30 - to have nuc med study to assess anterior wall on 12/1 , results still pending.  Acute systolic CHF due to ischemia - Pulmonary edema / volume overload EF 30-35% via TTE - tx w/ emergent HD w/ 5L removed - Nephrology considering dropping EDW - had hemodialysis 2 days back to back on 11/30, 12/1 .  Hyperkalemia  per Nephrology   Transaminitis  likely "shock liver" due to MI vs/ passive congestion related to acute CHF - LFTs normalized  11/30   ESRD on HD (M/W/F) Dialyzed emergently 11/26>11/27 night - Nephrology following   HTN BP reasonably controlled at present - follow w/o change - [place] term goal will be SBP closer to 120 - Nephrology considering drop in EDW  HLD Cont lipitor   Deaf / Legally blind Family reports her visual loss is relatively recent following opthalmologic procedures at [facility] - they also report she is supposed to be on eye gtts but that she has not been getting them for weeks - there are no documents available via Care Everywhere   Psychotic d/o NOS No evidence of agitation at this time   DM CBG currently reasonably controlled - follow w/o change today    Anemia of ESRD Hgb stable - epo and Fe per Nephrology   Code Status: FULL Family Communication: no family present at time of exam Disposition Plan: Continue with telemetry  Consultants: Cardiology Nephrology  Procedures: TTE - 11/27 - EF 30-35% - dyskinesis and aneursymal deformity of the mid-apical, anteroseptal, anterior, inferior, inferoseptal, and apical myocardium; consistent with infarction in the distribution of the left anterior descending coronary artery - grade 2 DD Nuclear stress test 12/1  Antibiotics: none  DVT prophylaxis: SQ heparin   Objective: Blood pressure 105/62, pulse 102, temperature 97.8 F (36.6 C), temperature source Oral, resp. rate 9, height 5\' 4"  (1.626 m), weight 78.1 kg (172 lb 2.9 oz), SpO2 100 %.  Intake/Output Summary (Last 24 hours) at 11/24/14 1557 Last data filed at 11/24/14 1500  Gross per 24 hour  Intake  120 ml  Output   4000 ml  Net  -3880 ml   Exam: General: No acute respiratory distress - appears comfortable Lungs: Clear to auscultation bilaterally without wheezes or crackles Cardiovascular: Regular rate and rhythm without murmur gallop or rub  Abdomen: Nontender, nondistended, soft, bowel sounds positive, no rebound, no ascites, no appreciable mass Extremities: No  significant cyanosis, clubbing, edema bilateral lower extremities  Data Reviewed: Basic Metabolic Panel:  Recent Labs Lab 11/21/14 0156 11/22/14 0210 11/23/14 0329 11/23/14 0906 11/24/14 0325  NA 134* 128* 126* 125* 132*  K 5.0 5.8* 5.9* 5.4* 4.4  CL 93* 86* 85* 84* 92*  CO2 26 24 24 23 25   GLUCOSE 166* 170* 118* 136* 99  BUN 45* 62* 75* 78* 40*  CREATININE 4.77* 6.34* 7.05* 7.02* 4.51*  CALCIUM 9.0 8.8 8.8 8.9 8.7  PHOS 5.2* 5.4* 7.4* 7.1* 6.0*    Liver Function Tests:  Recent Labs Lab 11/19/14 2335 11/21/14 0156 11/22/14 0210 11/23/14 0329 11/23/14 0906 11/24/14 0325  AST 60*  --   --  12  --   --   ALT 68*  --   --  26  --   --   ALKPHOS 142*  --   --  96  --   --   BILITOT 0.3  --   --  0.4  --   --   PROT 8.6*  --   --  7.4  --   --   ALBUMIN 3.5 3.2* 3.4* 3.1*  3.2* 3.4* 3.0*   Coags:  Recent Labs Lab 11/20/14 0002  INR 1.04   CBC:  Recent Labs Lab 11/20/14 0245 11/21/14 0156 11/22/14 0210 11/23/14 0329 11/24/14 0325  WBC 9.1 8.7 11.6* 8.4 7.2  HGB 9.6* 9.0* 9.0* 8.5* 8.6*  HCT 29.9* 27.7* 28.1* 26.6* 27.2*  MCV 94.6 97.2 94.6 94.0 96.5  PLT 235 251 266 247 233    Cardiac Enzymes:  Recent Labs Lab 11/20/14 0229 11/20/14 1439 11/20/14 1918 11/21/14 0156  TROPONINI 0.42* 1.63* 1.30* 0.81*   BNP (last 3 results)  Recent Labs  11/19/14 2335  PROBNP 19448.0*    CBG:  Recent Labs Lab 11/23/14 0758 11/23/14 1632 11/23/14 2124 11/24/14 0555 11/24/14 1055  GLUCAP 115* 110* 129* 110* 102*    Recent Results (from the past 240 hour(s))  MRSA PCR Screening     Status: None   Collection Time: 11/20/14  9:05 AM  Result Value Ref Range Status   MRSA by PCR NEGATIVE NEGATIVE Final    Comment:        The GeneXpert MRSA Assay (FDA approved for NASAL specimens only), is one component of a comprehensive MRSA colonization surveillance program. It is not intended to diagnose MRSA infection nor to guide or monitor treatment  for MRSA infections.        Scheduled Meds:  Scheduled Meds: . aspirin  81 mg Oral Daily  . atorvastatin  80 mg Oral Q2000  . calcium acetate  667 mg Oral TID WC  . cloNIDine  0.2 mg Transdermal Weekly  . darbepoetin (ARANESP) injection - DIALYSIS  40 mcg Intravenous Q Mon-HD  . docusate sodium  100 mg Oral BID  . doxercalciferol  1 mcg Oral Q M,W,F-HD  . ferric gluconate (FERRLECIT/NULECIT) IV  125 mg Intravenous Q Mon-HD  . heparin subcutaneous  5,000 Units Subcutaneous 3 times per day  . hydrALAZINE  100 mg Oral 3 times per day  . insulin aspart  0-9 Units Subcutaneous  TID WC  . isosorbide mononitrate  60 mg Oral Daily  . metoprolol tartrate  25 mg Oral BID  . multivitamin  1 tablet Oral QHS  . [START ON 11/29/2014] risperiDONE microspheres  25 mg Intramuscular Q14 Days    Time spent on care of this patient: 25 mins   Glennie Bose , MD   [facility]  If 7PM-7AM, please contact night-coverage [email or web address] Password [email or web address] 11/24/2014, 3:57 PM   LOS: 5 days

## 2014-11-24 NOTE — Progress Notes (Signed)
The patient appeared to tolerate the procedure well.  Ousmane Seeman, PAC

## 2014-11-24 NOTE — Progress Notes (Signed)
PT Cancellation Note  Patient Details Name: [name] MRN: [id number] DOB: 1956-01-19   Cancelled Treatment:    Reason Eval/Treat Not Completed: Patient at procedure or test/unavailable. Pt currently in HD and then to have stress test. Have cancelled interpreter for this PM and rescheduled interpreter for tomorrow at 2:30.   Gertrue Willette 11/24/2014, 11:10 AM  Suanne Marker PT [phone]

## 2014-11-24 NOTE — Progress Notes (Addendum)
OT cancellation Note  Pt currently in HD, then scheduled for myoview this pm.  Will reattempt.  Lucille Passy, OTR/L [phone]

## 2014-11-24 NOTE — Progress Notes (Signed)
  [facility] Progress Note   Subjective: looks much better today  Filed Vitals:   11/24/14 0804 11/24/14 0830 11/24/14 0900 11/24/14 0930  BP: 141/73 145/73 123/73 146/70  Pulse: 88 86 84 90  Temp:      TempSrc:      Resp:   8   Height:      Weight:      SpO2:       Exam: Calm, on HD No jvd Chest occ rales R base, L clear RRR no MRG Abd soft, NTND, +BS No LE edema Neuro nonfocal, mute LUA AVF +bruit  HD: MWF [place] 4h 39min   80.5kg  2/2.5 [place]   Heparin 9000  AVF LUA Aranesp 25 ug/wk,  Hect 1 mcg TIW, Venofer 100 / wk  Assessment: 1. Pulm edema/ resp distress - resolved 2. ESRD 3. S/P MI - large WMA ant wall, per cardiology 4. HTN/vol - clon/ hydral / MTP, cont to lower volume , lower dry wt 5. Anemia of ESRD - ^'d aranesp 40, Hb 8.6 6. MBD- cont meds 7. DM per primary  Plan- extra HD today for volume, max UF    Kelly Splinter MD  pager [phone]    cell [phone]  11/24/2014, 9:46 AM     Recent Labs Lab 11/23/14 0329 11/23/14 0906 11/24/14 0325  NA 126* 125* 132*  K 5.9* 5.4* 4.4  CL 85* 84* 92*  CO2 24 23 25   GLUCOSE 118* 136* 99  BUN 75* 78* 40*  CREATININE 7.05* 7.02* 4.51*  CALCIUM 8.8 8.9 8.7  PHOS 7.4* 7.1* 6.0*    Recent Labs Lab 11/19/14 2335  11/23/14 0329 11/23/14 0906 11/24/14 0325  AST 60*  --  12  --   --   ALT 68*  --  26  --   --   ALKPHOS 142*  --  96  --   --   BILITOT 0.3  --  0.4  --   --   PROT 8.6*  --  7.4  --   --   ALBUMIN 3.5  < > 3.1*  3.2* 3.4* 3.0*  < > = values in this interval not displayed.  Recent Labs Lab 11/22/14 0210 11/23/14 0329 11/24/14 0325  WBC 11.6* 8.4 7.2  HGB 9.0* 8.5* 8.6*  HCT 28.1* 26.6* 27.2*  MCV 94.6 94.0 96.5  PLT 266 247 233   . aspirin  81 mg Oral Daily  . atorvastatin  80 mg Oral Q2000  . calcium acetate  667 mg Oral TID WC  . cloNIDine  0.2 mg Transdermal Weekly  . darbepoetin (ARANESP) injection - DIALYSIS  40 mcg Intravenous Q Mon-HD  . docusate sodium   100 mg Oral BID  . doxercalciferol  1 mcg Oral Q M,W,F-HD  . ferric gluconate (FERRLECIT/NULECIT) IV  125 mg Intravenous Q Mon-HD  . heparin subcutaneous  5,000 Units Subcutaneous 3 times per day  . hydrALAZINE  100 mg Oral 3 times per day  . insulin aspart  0-9 Units Subcutaneous TID WC  . isosorbide mononitrate  60 mg Oral Daily  . metoprolol tartrate  25 mg Oral BID  . multivitamin  1 tablet Oral QHS  . [START ON 11/29/2014] risperiDONE microspheres  25 mg Intramuscular Q14 Days     bisacodyl, lactulose, levalbuterol, morphine injection, naphazoline-pheniramine, nitroGLYCERIN, ondansetron (ZOFRAN) IV, promethazine

## 2014-11-24 NOTE — Progress Notes (Signed)
Patient with order for a stress test today. She's also on schedule to dialyze. Per radiology nurse, patient will leave from dialysis to Rad. Interpreter phone number given to radiology nurse.   Ave Filter, RN

## 2014-11-25 DIAGNOSIS — D649 Anemia, unspecified: Secondary | ICD-10-CM

## 2014-11-25 DIAGNOSIS — H540 Blindness, both eyes: Secondary | ICD-10-CM

## 2014-11-25 LAB — GLUCOSE, CAPILLARY
Glucose-Capillary: 137 mg/dL — ABNORMAL HIGH (ref 70–99)
Glucose-Capillary: 127 mg/dL — ABNORMAL HIGH (ref 70–99)
Glucose-Capillary: 156 mg/dL — ABNORMAL HIGH (ref 70–99)
Glucose-Capillary: 127 mg/dL — ABNORMAL HIGH (ref 70–99)
Glucose-Capillary: 106 mg/dL — ABNORMAL HIGH (ref 70–99)
Glucose-Capillary: 171 mg/dL — ABNORMAL HIGH (ref 70–99)
Glucose-Capillary: 81 mg/dL (ref 70–99)

## 2014-11-25 LAB — BASIC METABOLIC PANEL
Anion gap: 13 (ref 5–15)
BUN: 36 mg/dL — ABNORMAL HIGH (ref 6–23)
CO2: 27 mEq/L (ref 19–32)
Calcium: 8.9 mg/dL (ref 8.4–10.5)
Chloride: 94 mEq/L — ABNORMAL LOW (ref 96–112)
Creatinine, Ser: 4.09 mg/dL — ABNORMAL HIGH (ref 0.50–1.10)
GFR calc Af Amer: 13 mL/min — ABNORMAL LOW (ref >90)
GFR calc non Af Amer: 11 mL/min — ABNORMAL LOW (ref >90)
Glucose, Bld: 125 mg/dL — ABNORMAL HIGH (ref 70–99)
Potassium: 4.4 mEq/L (ref 3.7–5.3)
Sodium: 134 mEq/L — ABNORMAL LOW (ref 137–147)

## 2014-11-25 LAB — CBC
HCT: 28.1 % — ABNORMAL LOW (ref 36.0–46.0)
Hemoglobin: 8.8 g/dL — ABNORMAL LOW (ref 12.0–15.0)
MCH: 30.3 pg (ref 26.0–34.0)
MCHC: 31.3 g/dL (ref 30.0–36.0)
MCV: 96.9 fL (ref 78.0–100.0)
Platelets: 251 10*3/uL (ref 150–400)
RBC: 2.9 MIL/uL — ABNORMAL LOW (ref 3.87–5.11)
RDW: 13.8 % (ref 11.5–15.5)
WBC: 9.6 10*3/uL (ref 4.0–10.5)

## 2014-11-25 MED ORDER — CLONIDINE HCL 0.1 MG/24HR TD PTWK
0.1000 mg | MEDICATED_PATCH | TRANSDERMAL | Status: DC
  Filled 2014-11-25: qty 1

## 2014-11-25 MED ORDER — PENTAFLUOROPROP-TETRAFLUOROETH EX AERO: 1.0000 "application " | INHALATION_SPRAY | CUTANEOUS | Status: DC | PRN

## 2014-11-25 MED ORDER — METOPROLOL SUCCINATE ER 50 MG PO TB24
50.0000 mg | ORAL_TABLET | Freq: Every day | ORAL | Status: DC
  Administered 2014-11-26: 50 mg via ORAL
  Filled 2014-11-25 (×2): qty 1

## 2014-11-25 MED ORDER — LIDOCAINE-PRILOCAINE 2.5-2.5 % EX CREA: 1.0000 "application " | TOPICAL_CREAM | CUTANEOUS | Status: DC | PRN

## 2014-11-25 MED ORDER — SODIUM CHLORIDE 0.9 % IV SOLN: 100.0000 mL | INTRAVENOUS | Status: DC | PRN

## 2014-11-25 MED ORDER — ALTEPLASE 2 MG IJ SOLR: 2.0000 mg | Freq: Once | INTRAMUSCULAR | Status: AC | PRN

## 2014-11-25 MED ORDER — CLOPIDOGREL BISULFATE 75 MG PO TABS
75.0000 mg | ORAL_TABLET | Freq: Every day | ORAL | Status: DC
  Administered 2014-11-25 – 2014-11-26 (×2): 75 mg via ORAL
  Filled 2014-11-25 (×2): qty 1

## 2014-11-25 MED ORDER — LIDOCAINE HCL (PF) 1 % IJ SOLN: 5.0000 mL | INTRAMUSCULAR | Status: DC | PRN

## 2014-11-25 MED ORDER — HEPARIN SODIUM (PORCINE) 1000 UNIT/ML DIALYSIS: 1000.0000 [IU] | INTRAMUSCULAR | Status: DC | PRN

## 2014-11-25 MED ORDER — HEPARIN SODIUM (PORCINE) 1000 UNIT/ML DIALYSIS: 6000.0000 [IU] | Freq: Once | INTRAMUSCULAR | Status: DC

## 2014-11-25 MED ORDER — LISINOPRIL 2.5 MG PO TABS
2.5000 mg | ORAL_TABLET | Freq: Every day | ORAL | Status: DC
  Administered 2014-11-26: 2.5 mg via ORAL
  Filled 2014-11-25 (×2): qty 1

## 2014-11-25 MED ORDER — NEPRO/CARBSTEADY PO LIQD: 237.0000 mL | ORAL | Status: DC | PRN

## 2014-11-25 NOTE — Progress Notes (Signed)
[name] [id number] DOB: 1956-03-05 DOA: 11/19/2014 PCP: No primary care provider on file.  Brief admission note, per Dr. Blaine Hamper on 11/27 58 y.o. female with past medical history of end-stage renal disease on dialysis (M/W/F), deafness, blindness, diabetes mellitus, anemia, hypertension, and hyperlipidemia who presented with shortness of breath and chest pain. Patient presented from nursing home, History was obtained from patient via sign language translator, and also from her son and daughter. They stated the patient had worsening shortness of breath over 2 weeks. She had been having wheezing and mild dry cough per her son. She also reported chest pain transiently the day prior to her admit. She had some mild nausea, but no vomiting or diarrhea.  In ED, she was found to have pulmonary edema on chest x-ray, potassium 6.7, troponin elevation at 0.70, temperature 97.9. She was given insulin, kayexalate, sodium bicarbonate in ED. Cardiology was consulted. Renal was consulted for urgent dialysis. Patient was diagnosed with non-ST elevated MI, started on IV heparin drip initially, which was stopped on 11/30. Patient was transferred to the floor on 11/30  Assessment/Plan:  Acute large anterior MI - Cardiology following, troponin peaked at 1.63, patient was placed on IV heparin drip, stopped on 11/30  - Patient underwent nuclear medicine stress test 12/1 which showed a fixed defects involving the apex, anterior lateral wall and inferior wall. The apex and anterior lateral wall fixed defects are suggestive of infarct, no evidence of reversibility or ischemia, diffuse hypokinesia and minimal wall motion along the lateral wall, EF 35%, awaiting cardiology recommendations.  Acute systolic CHF due to ischemia - Pulmonary edema with volume overload 2-D echo showed EF 30-35% , nephrology was consulted and patient underwent emergent hemodialysis with 5 L removed.  Patient had back-to-back hemodialysis on  11/30, 12/1 and regular hemodialysis day today, MWF  Discussed with nephrology, Dr. Melvia Heaps, next hemodialysis on Friday.  Hyperkalemia  Resolved after hemodialysis  Transaminitis  Possibly secondary to shock liver due to MI versus passive congestion related to acute CHF, LFTs have normalized 11/30   ESRD on HD (M/W/F) Dialyzed emergently on admission, Nephrology following, patient undergoing hemodialysis today, next hemodialysis on Friday   HTN BP controlled, [place] term goal will be SBP closer to 120, Nephrology considering drop in [place]   Deaf / Legally blind Family reported her visual loss is relatively recent following opthalmologic procedures at [facility]  Diabetes mellitus CBG currently reasonably controlled   Continue sliding scale insulin  Anemia of ESRD Hgb stable - epo and Fe per Nephrology   DVT prophylaxis: Subcutaneous heparin  Code Status: FULL  Family Communication: no family present at time of exam  Disposition Awaiting cardiology recommendations, possible DC to SNF today  Consultants: Cardiology Nephrology  Procedures: TTE - 11/27 - EF 30-35% - dyskinesis and aneursymal deformity of the mid-apical, anteroseptal, anterior, inferior, inferoseptal, and apical myocardium; consistent with infarction in the distribution of the left anterior descending coronary artery - grade 2 DD Nuclear stress test 12/1  Subjective Patient seen and examined, during hemodialysis, no acute issues, comfortable  Objective: Blood pressure 101/65, pulse 81, temperature 98.4 F (36.9 C), temperature source Axillary, resp. rate 13, height 5\' 4"  (1.626 m), weight 79.9 kg (176 lb 2.4 oz), SpO2 100 %.  Intake/Output Summary (Last 24 hours) at 11/25/14 1040 Last data filed at 11/25/14 0827  Gross per 24 hour  Intake   1080 ml  Output   4200 ml  Net  -3120  ml   Exam: Gen: Appears comfortable, NAD Lungs: Clear to auscultation bilaterally, no wheezes CVS: RRR,  no murmurs rubs or gallops Abdomen: Soft, NT, ND, NBS Extremities no cyanosis clubbing or edema     Data Reviewed: Basic Metabolic Panel:  Recent Labs Lab 11/21/14 0156 11/22/14 0210 11/23/14 0329 11/23/14 0906 11/24/14 0325 11/25/14 0610  NA 134* 128* 126* 125* 132* 134*  K 5.0 5.8* 5.9* 5.4* 4.4 4.4  CL 93* 86* 85* 84* 92* 94*  CO2 26 24 24 23 25 27   GLUCOSE 166* 170* 118* 136* 99 125*  BUN 45* 62* 75* 78* 40* 36*  CREATININE 4.77* 6.34* 7.05* 7.02* 4.51* 4.09*  CALCIUM 9.0 8.8 8.8 8.9 8.7 8.9  PHOS 5.2* 5.4* 7.4* 7.1* 6.0*  --     Liver Function Tests:  Recent Labs Lab 11/19/14 2335 11/21/14 0156 11/22/14 0210 11/23/14 0329 11/23/14 0906 11/24/14 0325  AST 60*  --   --  12  --   --   ALT 68*  --   --  26  --   --   ALKPHOS 142*  --   --  96  --   --   BILITOT 0.3  --   --  0.4  --   --   PROT 8.6*  --   --  7.4  --   --   ALBUMIN 3.5 3.2* 3.4* 3.1*  3.2* 3.4* 3.0*   Coags:  Recent Labs Lab 11/20/14 0002  INR 1.04   CBC:  Recent Labs Lab 11/21/14 0156 11/22/14 0210 11/23/14 0329 11/24/14 0325 11/25/14 0610  WBC 8.7 11.6* 8.4 7.2 9.6  HGB 9.0* 9.0* 8.5* 8.6* 8.8*  HCT 27.7* 28.1* 26.6* 27.2* 28.1*  MCV 97.2 94.6 94.0 96.5 96.9  PLT 251 266 247 233 251    Cardiac Enzymes:  Recent Labs Lab 11/20/14 0229 11/20/14 1439 11/20/14 1918 11/21/14 0156  TROPONINI 0.42* 1.63* 1.30* 0.81*   BNP (last 3 results)  Recent Labs  11/19/14 2335  PROBNP 19448.0*    CBG:  Recent Labs Lab 11/24/14 0555 11/24/14 1055 11/24/14 1555 11/24/14 2102 11/25/14 0608  GLUCAP 110* 102* 156* 107* 127*    Recent Results (from the past 240 hour(s))  MRSA PCR Screening     Status: None   Collection Time: 11/20/14  9:05 AM  Result Value Ref Range Status   MRSA by PCR NEGATIVE NEGATIVE Final    Comment:        The GeneXpert MRSA Assay (FDA approved for NASAL specimens only), is one component of a comprehensive MRSA colonization surveillance  program. It is not intended to diagnose MRSA infection nor to guide or monitor treatment for MRSA infections.        Scheduled Meds:  Scheduled Meds: . aspirin  81 mg Oral Daily  . atorvastatin  80 mg Oral Q2000  . calcium acetate  667 mg Oral TID WC  . cloNIDine  0.2 mg Transdermal Weekly  . darbepoetin (ARANESP) injection - DIALYSIS  40 mcg Intravenous Q Mon-HD  . docusate sodium  100 mg Oral BID  . doxercalciferol  1 mcg Oral Q M,W,F-HD  . ferric gluconate (FERRLECIT/NULECIT) IV  125 mg Intravenous Q Mon-HD  . heparin subcutaneous  5,000 Units Subcutaneous 3 times per day  . [START ON 11/26/2014] heparin  6,000 Units Dialysis Once in dialysis  . hydrALAZINE  100 mg Oral 3 times per day  . insulin aspart  0-9 Units Subcutaneous TID WC  . isosorbide  mononitrate  60 mg Oral Daily  . metoprolol tartrate  25 mg Oral BID  . multivitamin  1 tablet Oral QHS  . [START ON 11/29/2014] risperiDONE microspheres  25 mg Intramuscular Q14 Days    Time spent on care of this patient: 25 mins   Jamahl Lemmons , MD    If 7PM-7AM, please contact night-coverage [email or web address] Password [email or web address] 11/25/2014, 10:40 AM   LOS: 6 days

## 2014-11-25 NOTE — Progress Notes (Signed)
Pt went to dialysis during shift report. Unable to complete a full assessment. However, pt was stable, no resp distress and last set of vs within normal limits during bedside report.

## 2014-11-25 NOTE — Procedures (Signed)
I was present at this dialysis session, have reviewed the session itself and made  appropriate changes  Kelly Splinter MD (pgr) [phone]    ([phone] 11/25/2014, 10:12 AM

## 2014-11-25 NOTE — Evaluation (Signed)
Occupational Therapy Evaluation Patient Details Name: [name] MRN: [id number] DOB: 23-Jul-1956 Today's Date: 11/25/2014    History of Present Illness 58 y.o. female with past medical history of end-stage renal disease on dialysis (M/W/F), deafness, blindness, diabetes mellitus, anemia, hypertension, and hyperlipidemia who presented with shortness of breath and chest pain. Pt found to have large anterior wall MI.   Clinical Impression   Pt admitted with above. She demonstrates the below listed deficits and will benefit from continued OT to maximize safety and independence with BADLs.  Currently, pt presents to OT with generalized weakness in addition to being blind and deaf.   Overall, pt did fairly well with familiar ADL activities and functional mobility.  She was seen in conjunction with interpreter, who is familiar with pt.  Pt's vision and hearing loss make it difficult to accurately assess her due to the change and unfamiliarity with her environment.   Pt is for planned discharge back to SNF.   Feel that all further OT needs can be addressed at SNF.  Also feel she would benefit from referral to [facility] as they may be able to provide assistive devices and assist to help increase her independence and function.       Follow Up Recommendations  SNF    Equipment Recommendations  None recommended by OT    Recommendations for Other Services       Precautions / Restrictions Precautions Precautions: Fall Restrictions Weight Bearing Restrictions: No      Mobility Bed Mobility Overal bed mobility: Needs Assistance Bed Mobility: Supine to Sit     Supine to sit: Min assist     General bed mobility comments: Tactile cues to initiate movement, assist to elevate trunk to upright EOB  Transfers Overall transfer level: Needs assistance Equipment used: 2 person hand held assist Transfers: Sit to/from Stand Sit to Stand: Min assist Stand pivot transfers: Min  assist       General transfer comment: Min assist for stability and tactile cueing, hand over hand and hand placement cues given using chair rail, bed mattress, and seat surface    Balance Overall balance assessment: Needs assistance   Sitting balance-Leahy Scale: Fair Sitting balance - Comments: Sat EOB with supervision   Standing balance support: During functional activity Standing balance-Leahy Scale: Fair Standing balance comment: use of hips for environmental cues, able to repositioning and perform dynamic tasks                            ADL Overall ADL's : Needs assistance/impaired Eating/Feeding: Minimal assistance;Bed level;Sitting   Grooming: Wash/dry hands;Wash/dry face;Oral care;Minimal assistance;Standing Grooming Details (indicate cue type and reason): Pt needs guidance to initiate activity and to locate items  Upper Body Bathing: Minimal assitance;Standing   Lower Body Bathing: Moderate assistance;Sit to/from stand   Upper Body Dressing : Moderate assistance;Sitting   Lower Body Dressing: Maximal assistance;Sit to/from stand   Toilet Transfer: Minimal assistance;Ambulation;Comfort height toilet   Toileting- Clothing Manipulation and Hygiene: Minimal assistance;Sit to/from stand       Functional mobility during ADLs: Minimal assistance General ADL Comments: Pt moves cautiously.  Requires cues/encouragement to use hands to feel and explore items and environment.  Once she is encouraged to do for herself, she does quite well.   Unsure of her baseline and how well she functions at the SNF where she and spouse reside.      Vision  Additional Comments: Pt is blind.  Interpreter who was present is familiar with pt, but is unsure how Hsia she has been blind.  Per chart, it states onset of blindness if fairly recent.  Pt frequently states (via sign language) that she can't see   Perception     Praxis      Pertinent Vitals/Pain  Faces Pain Scale: No hurt     Hand Dominance Right   Extremity/Trunk Assessment Upper Extremity Assessment Upper Extremity Assessment: Generalized weakness   Lower Extremity Assessment Lower Extremity Assessment: Defer to PT evaluation   Cervical / Trunk Assessment Cervical / Trunk Assessment: Normal   Communication Communication Communication: Deaf   Cognition Arousal/Alertness: Awake/alert Behavior During Therapy: WFL for tasks assessed/performed Overall Cognitive Status: Difficult to assess                     General Comments       Exercises       Shoulder Instructions      Home Living Family/patient expects to be discharged to:: Skilled nursing facility                                 Additional Comments: Pt at [facility]      Prior Functioning/Environment Level of Independence: Needs assistance  Gait / Transfers Assistance Needed: During hospitalization in 12/2013 pt was amb with walker with assist. Unsure of status just prior to admission.          OT Diagnosis: Generalized weakness;Disturbance of vision;Other (comment) (deaf)   OT Problem List: Decreased strength;Decreased activity tolerance;Impaired balance (sitting and/or standing);Impaired vision/perception   OT Treatment/Interventions:      OT Goals(Current goals can be found in the care plan section) Acute Rehab OT Goals Patient Stated Goal: Pt did not state.  she did thank therapist's multiple times for helping her with BADLs OT Goal Formulation: All assessment and education complete, DC therapy  OT Frequency:     Barriers to D/C:            Co-evaluation              End of Session Equipment Utilized During Treatment: Oxygen Nurse Communication: Mobility status  Activity Tolerance: Patient tolerated treatment well Patient left: in bed;with call bell/phone within reach;with bed alarm set;with family/visitor present   Time: [id number] OT Time Calculation  (min): 72 min Charges:  OT Evaluation $Initial OT Evaluation Tier I: 1 Procedure OT Treatments $Self Care/Home Management : 23-37 mins G-Codes:    Trei Schoch M 12/11/14, 6:19 PM

## 2014-11-25 NOTE — Progress Notes (Signed)
    Subjective:  Sleeping on dialysis.  Objective:  Vital Signs in the last 24 hours: Temp:  [97.5 F (36.4 C)-99.3 F (37.4 C)] 98.4 F (36.9 C) (12/02 0720) Pulse Rate:  [81-113] 96 (12/02 0900) Resp:  [9-20] 11 (12/02 0900) BP: (105-175)/(59-98) 143/78 mmHg (12/02 0900) SpO2:  [93 %-100 %] 100 % (12/02 0720) Weight:  [171 lb 12.8 oz (77.928 kg)-176 lb 2.4 oz (79.9 kg)] 176 lb 2.4 oz (79.9 kg) (12/02 0720)  Intake/Output from previous day:  Intake/Output Summary (Last 24 hours) at 11/25/14 0924 Last data filed at 11/25/14 0827  Gross per 24 hour  Intake   1080 ml  Output   4200 ml  Net  -3120 ml    Physical Exam: General appearance: no distress Lungs: clear to auscultation bilaterally Heart: regular rate and rhythm   Rate: 92  Rhythm: normal sinus rhythm  Lab Results:  Recent Labs  11/24/14 0325 11/25/14 0610  WBC 7.2 9.6  HGB 8.6* 8.8*  PLT 233 251    Recent Labs  11/24/14 0325 11/25/14 0610  NA 132* 134*  K 4.4 4.4  CL 92* 94*  CO2 25 27  GLUCOSE 99 125*  BUN 40* 36*  CREATININE 4.51* 4.09*   No results for input(s): TROPONINI in the last 72 hours.  Invalid input(s): CK, MB No results for input(s): INR in the last 72 hours.  Imaging: Imaging results have been reviewed  Cardiac Studies: Myoview 11/24/14 Perfusion: Fixed defect involving the apex. There is a fixed defect involving the anterior lateral wall at the apex and mid segment. Fixed defect along the inferior wall in the mid and basilar segments. No evidence for reversibility   Assessment/Plan:  58 y.o. female with PM hx significant for DM, ESRD on HD MWF, HTN, hyperlipidemia, deafness, and blindness, admitted 11/19/14 with respiratory failure and suspected late presentation of an anterior wall myocardial infarction. ECG consistent with recent anterolateral STEMI. Echo revealed LVD with an EF of 30-35% and wall motion abnormalities consistent with LAD distribution infarct. Myoview  shows no evidence of reversibility.    Principal Problem:   Acute respiratory failure with hypoxia  Active Problems:   Anterior wall infarct-(out of hopsital)   Cardiomyopathy, ischemic-EF 30-35%   Essential hypertension   Diabetes mellitus with ESRD (end-stage renal disease)   End stage renal disease on dialysis   HLD (hyperlipidemia)   Nonpsychotic mental disorder   Anemia   Deaf   Blind   PLAN: Will review medications with MD. Consider stopping Catapres patch, add Norvasc if needed for HTN, also could consider an ACE though she is already on Hydralazine and Nitrates.   Kerin Ransom PA-C Beeper [id number] 11/25/2014, 9:24 AM As above, patient seen and examined. Nuclear study shows infarct but no ischemia. Reduced LV function. Given multiple medical problems plan medical therapy. Continue aspirin, statin, hydralazine and nitrates. Change metoprolol to Toprol. Will wean clonidine patch to all. Add ACE inhibitor. Add plavix.  Kirk Ruths

## 2014-11-25 NOTE — Progress Notes (Signed)
[facility] Progress Note   Subjective: stable on HD  Filed Vitals:   11/25/14 0756 11/25/14 0830 11/25/14 0900 11/25/14 0930  BP: 117/68 128/69 143/78 120/67  Pulse: 93 91 96 88  Temp:      TempSrc:      Resp: 13 13 11 11   Height:      Weight:      SpO2:       Exam: Calm, on HD No jvd Chest clear bilat RRR no MRG Abd soft, NTND, +BS No LE edema Neuro nonfocal, mute LUA AVF +bruit  HD: MWF [facility] 4h 64min   80.5kg  2/2.5 Bath   Heparin 9000  AVF LUA Aranesp 25 ug/wk,  Hect 1 mcg TIW, Venofer 100 / wk  Assessment: 1. Pulm edema/ resp distress / Raliegh Ip - resolved 2. ESRD on HD 3. S/P MI - large WMA ant wall, per cardiology 4. HTN/vol - clon/ hydral / MTP, cont to lower volume , lower dry wt 5. Anemia of ESRD - ^'d aranesp 40, Hb 8.6 6. MBD- cont meds 7. DM per primary  Plan- HD today. Lower dry wt to at least 78kg.     Kelly Splinter MD  pager [phone]    cell [phone]  11/25/2014, 10:10 AM     Recent Labs Lab 11/23/14 0329 11/23/14 0906 11/24/14 0325 11/25/14 0610  NA 126* 125* 132* 134*  K 5.9* 5.4* 4.4 4.4  CL 85* 84* 92* 94*  CO2 24 23 25 27   GLUCOSE 118* 136* 99 125*  BUN 75* 78* 40* 36*  CREATININE 7.05* 7.02* 4.51* 4.09*  CALCIUM 8.8 8.9 8.7 8.9  PHOS 7.4* 7.1* 6.0*  --     Recent Labs Lab 11/19/14 2335  11/23/14 0329 11/23/14 0906 11/24/14 0325  AST 60*  --  12  --   --   ALT 68*  --  26  --   --   ALKPHOS 142*  --  96  --   --   BILITOT 0.3  --  0.4  --   --   PROT 8.6*  --  7.4  --   --   ALBUMIN 3.5  < > 3.1*  3.2* 3.4* 3.0*  < > = values in this interval not displayed.  Recent Labs Lab 11/23/14 0329 11/24/14 0325 11/25/14 0610  WBC 8.4 7.2 9.6  HGB 8.5* 8.6* 8.8*  HCT 26.6* 27.2* 28.1*  MCV 94.0 96.5 96.9  PLT 247 233 251   . aspirin  81 mg Oral Daily  . atorvastatin  80 mg Oral Q2000  . calcium acetate  667 mg Oral TID WC  . cloNIDine  0.2 mg Transdermal Weekly  . darbepoetin (ARANESP) injection -  DIALYSIS  40 mcg Intravenous Q Mon-HD  . docusate sodium  100 mg Oral BID  . doxercalciferol  1 mcg Oral Q M,W,F-HD  . ferric gluconate (FERRLECIT/NULECIT) IV  125 mg Intravenous Q Mon-HD  . heparin subcutaneous  5,000 Units Subcutaneous 3 times per day  . [START ON 11/26/2014] heparin  6,000 Units Dialysis Once in dialysis  . hydrALAZINE  100 mg Oral 3 times per day  . insulin aspart  0-9 Units Subcutaneous TID WC  . isosorbide mononitrate  60 mg Oral Daily  . metoprolol tartrate  25 mg Oral BID  . multivitamin  1 tablet Oral QHS  . [START ON 11/29/2014] risperiDONE microspheres  25 mg Intramuscular Q14 Days     sodium chloride, sodium chloride, alteplase, bisacodyl, feeding  supplement (NEPRO CARB STEADY), heparin, lactulose, levalbuterol, lidocaine (PF), lidocaine-prilocaine, morphine injection, naphazoline-pheniramine, nitroGLYCERIN, ondansetron (ZOFRAN) IV, pentafluoroprop-tetrafluoroeth, promethazine

## 2014-11-25 NOTE — Progress Notes (Signed)
Physical Therapy Treatment Patient Details Name: [name] MRN: [id number] DOB: 1956/06/11 Today's Date: 11/25/2014    History of Present Illness 58 y.o. female with past medical history of end-stage renal disease on dialysis (M/W/F), deafness, blindness, diabetes mellitus, anemia, hypertension, and hyperlipidemia who presented with shortness of breath and chest pain. Pt found to have large anterior wall MI.    PT Comments    Patient progressing with all aspects of mobility. Ambulated well with tactile assist arm in arm, required tactile signing via interpreter for initiation of activities but once initiated patient demonstrates significant level of functional ability given current impairments. VSS, ambulated and performed activities on 3 liters [place] with SpO2 >94%.  Desaturated to 88% on room air.   Follow Up Recommendations  SNF     Equipment Recommendations  None recommended by PT    Recommendations for Other Services       Precautions / Restrictions Precautions Precautions: Fall Restrictions Weight Bearing Restrictions: No    Mobility  Bed Mobility Overal bed mobility: Needs Assistance Bed Mobility: Supine to Sit     Supine to sit: Min assist     General bed mobility comments: Tactile cues to initiate movement, assist to elevate trunk to upright EOB  Transfers Overall transfer level: Needs assistance Equipment used: 2 person hand held assist Transfers: Sit to/from [facility] Sit to Stand: Min assist Stand pivot transfers: Min assist       General transfer comment: Min assist for stability and tactile cueing, hand over hand and hand placement cues given using chair rail, bed mattress, and seat surface  Ambulation/Gait Ambulation/Gait assistance: +2 physical assistance;Min assist Ambulation Distance (Feet): 240 Feet Assistive device: 2 person hand held assist Gait Pattern/deviations: Step-through pattern;Decreased stride length;Narrow base  of support (guarded gait) Gait velocity: decreased cadence secondary to uncertainty with gait, as patient became more comfortable with tactile cues, cadence increased Gait velocity interpretation: Below normal speed for age/gender General Gait Details: instability noted secondary to insecurity [id number] deficits, decreased cadence secondary to uncertainty with gait, as patient became more comfortable with tactile cues, cadence increased   Stairs            Wheelchair Mobility    Modified Rankin (Stroke Patients Only)       Balance Overall balance assessment: Needs assistance   Sitting balance-Leahy Scale: Fair Sitting balance - Comments: Sat EOB with supervision   Standing balance support: During functional activity Standing balance-Leahy Scale: Fair Standing balance comment: use of hips for environmental cues, able to repositioning and perform dynamic tasks                    Cognition Arousal/Alertness: Awake/alert Behavior During Therapy: WFL for tasks assessed/performed Overall Cognitive Status: Difficult to assess                      Exercises      General Comments General comments (skin integrity, edema, etc.): extensive standing activities performed with tactile cues at sink, balance control activities, multiple transfers and ambulation throughout session.       Pertinent Vitals/Pain Faces Pain Scale: No hurt    Home Living Family/patient expects to be discharged to:: Skilled nursing facility               Additional Comments: Pt at [facility]    Prior Function Level of Independence: Needs assistance  Gait / Transfers Assistance Needed: During hospitalization in 12/2013 pt was amb with walker with  assist. Unsure of status just prior to admission.       PT Goals (current goals can now be found in the care plan section) Acute Rehab PT Goals Patient Stated Goal: Pt unable PT Goal Formulation: Patient unable to participate in goal  setting Time For Goal Achievement: 11/30/14 Potential to Achieve Goals: [place] Progress towards PT goals: Progressing toward goals    Frequency  Min 2X/week    PT Plan Current plan remains appropriate    Co-evaluation             End of Session Equipment Utilized During Treatment: Oxygen Activity Tolerance: Patient tolerated treatment well Patient left: in chair;with call bell/phone within reach;with chair alarm set;with family/visitor present     Time: [id number] PT Time Calculation (min) (ACUTE ONLY): 72 min  Charges:  $Gait Training: 8-22 mins $Therapeutic Activity: 8-22 mins $Self Care/Home Management: 8-22                    G CodesDuncan Dull 12-05-14, 5:40 PM Alben Deeds, [place] DPT  [phone]

## 2014-11-26 DIAGNOSIS — F22 Delusional disorders: Secondary | ICD-10-CM | POA: Diagnosis present

## 2014-11-26 DIAGNOSIS — I252 Old myocardial infarction: Secondary | ICD-10-CM | POA: Diagnosis not present

## 2014-11-26 DIAGNOSIS — N921 Excessive and frequent menstruation with irregular cycle: Secondary | ICD-10-CM | POA: Diagnosis present

## 2014-11-26 DIAGNOSIS — F419 Anxiety disorder, unspecified: Secondary | ICD-10-CM | POA: Diagnosis not present

## 2014-11-26 DIAGNOSIS — I5032 Chronic diastolic (congestive) heart failure: Secondary | ICD-10-CM | POA: Diagnosis not present

## 2014-11-26 DIAGNOSIS — I509 Heart failure, unspecified: Secondary | ICD-10-CM | POA: Diagnosis not present

## 2014-11-26 DIAGNOSIS — D631 Anemia in chronic kidney disease: Secondary | ICD-10-CM | POA: Diagnosis present

## 2014-11-26 DIAGNOSIS — Z7902 Long term (current) use of antithrombotics/antiplatelets: Secondary | ICD-10-CM | POA: Diagnosis not present

## 2014-11-26 DIAGNOSIS — Z6830 Body mass index (BMI) 30.0-30.9, adult: Secondary | ICD-10-CM | POA: Diagnosis not present

## 2014-11-26 DIAGNOSIS — N2581 Secondary hyperparathyroidism of renal origin: Secondary | ICD-10-CM | POA: Diagnosis present

## 2014-11-26 DIAGNOSIS — I2510 Atherosclerotic heart disease of native coronary artery without angina pectoris: Secondary | ICD-10-CM | POA: Diagnosis present

## 2014-11-26 DIAGNOSIS — I2109 ST elevation (STEMI) myocardial infarction involving other coronary artery of anterior wall: Secondary | ICD-10-CM | POA: Diagnosis not present

## 2014-11-26 DIAGNOSIS — I5042 Chronic combined systolic (congestive) and diastolic (congestive) heart failure: Secondary | ICD-10-CM | POA: Diagnosis present

## 2014-11-26 DIAGNOSIS — D259 Leiomyoma of uterus, unspecified: Secondary | ICD-10-CM | POA: Diagnosis present

## 2014-11-26 DIAGNOSIS — Z7982 Long term (current) use of aspirin: Secondary | ICD-10-CM | POA: Diagnosis not present

## 2014-11-26 DIAGNOSIS — A5009 Other early congenital syphilis, symptomatic: Secondary | ICD-10-CM | POA: Diagnosis present

## 2014-11-26 DIAGNOSIS — I071 Rheumatic tricuspid insufficiency: Secondary | ICD-10-CM | POA: Diagnosis present

## 2014-11-26 DIAGNOSIS — I25810 Atherosclerosis of coronary artery bypass graft(s) without angina pectoris: Secondary | ICD-10-CM | POA: Diagnosis not present

## 2014-11-26 DIAGNOSIS — E119 Type 2 diabetes mellitus without complications: Secondary | ICD-10-CM | POA: Diagnosis not present

## 2014-11-26 DIAGNOSIS — Z8674 Personal history of sudden cardiac arrest: Secondary | ICD-10-CM | POA: Diagnosis not present

## 2014-11-26 DIAGNOSIS — E041 Nontoxic single thyroid nodule: Secondary | ICD-10-CM | POA: Diagnosis present

## 2014-11-26 DIAGNOSIS — R4182 Altered mental status, unspecified: Secondary | ICD-10-CM | POA: Diagnosis not present

## 2014-11-26 DIAGNOSIS — Z66 Do not resuscitate: Secondary | ICD-10-CM | POA: Diagnosis present

## 2014-11-26 DIAGNOSIS — E875 Hyperkalemia: Secondary | ICD-10-CM | POA: Diagnosis not present

## 2014-11-26 DIAGNOSIS — S199XXA Unspecified injury of neck, initial encounter: Secondary | ICD-10-CM | POA: Diagnosis not present

## 2014-11-26 DIAGNOSIS — I472 Ventricular tachycardia: Secondary | ICD-10-CM | POA: Diagnosis present

## 2014-11-26 DIAGNOSIS — J9600 Acute respiratory failure, unspecified whether with hypoxia or hypercapnia: Secondary | ICD-10-CM | POA: Diagnosis not present

## 2014-11-26 DIAGNOSIS — D509 Iron deficiency anemia, unspecified: Secondary | ICD-10-CM | POA: Diagnosis not present

## 2014-11-26 DIAGNOSIS — N189 Chronic kidney disease, unspecified: Secondary | ICD-10-CM | POA: Diagnosis not present

## 2014-11-26 DIAGNOSIS — I255 Ischemic cardiomyopathy: Secondary | ICD-10-CM | POA: Diagnosis not present

## 2014-11-26 DIAGNOSIS — E871 Hypo-osmolality and hyponatremia: Secondary | ICD-10-CM | POA: Diagnosis present

## 2014-11-26 DIAGNOSIS — J80 Acute respiratory distress syndrome: Secondary | ICD-10-CM | POA: Diagnosis not present

## 2014-11-26 DIAGNOSIS — R55 Syncope and collapse: Secondary | ICD-10-CM | POA: Diagnosis not present

## 2014-11-26 DIAGNOSIS — H913 Deaf nonspeaking, not elsewhere classified: Secondary | ICD-10-CM | POA: Diagnosis not present

## 2014-11-26 DIAGNOSIS — D62 Acute posthemorrhagic anemia: Secondary | ICD-10-CM | POA: Diagnosis present

## 2014-11-26 DIAGNOSIS — E1122 Type 2 diabetes mellitus with diabetic chronic kidney disease: Secondary | ICD-10-CM | POA: Diagnosis present

## 2014-11-26 DIAGNOSIS — J9601 Acute respiratory failure with hypoxia: Secondary | ICD-10-CM | POA: Diagnosis not present

## 2014-11-26 DIAGNOSIS — Z515 Encounter for palliative care: Secondary | ICD-10-CM | POA: Diagnosis not present

## 2014-11-26 DIAGNOSIS — I10 Essential (primary) hypertension: Secondary | ICD-10-CM | POA: Diagnosis not present

## 2014-11-26 DIAGNOSIS — I469 Cardiac arrest, cause unspecified: Secondary | ICD-10-CM | POA: Diagnosis not present

## 2014-11-26 DIAGNOSIS — Z992 Dependence on renal dialysis: Secondary | ICD-10-CM | POA: Diagnosis not present

## 2014-11-26 DIAGNOSIS — H9480 Other specified disorders of ear in diseases classified elsewhere, unspecified ear: Secondary | ICD-10-CM | POA: Diagnosis present

## 2014-11-26 DIAGNOSIS — F489 Nonpsychotic mental disorder, unspecified: Secondary | ICD-10-CM | POA: Diagnosis not present

## 2014-11-26 DIAGNOSIS — I272 Other secondary pulmonary hypertension: Secondary | ICD-10-CM | POA: Diagnosis present

## 2014-11-26 DIAGNOSIS — I120 Hypertensive chronic kidney disease with stage 5 chronic kidney disease or end stage renal disease: Secondary | ICD-10-CM | POA: Diagnosis present

## 2014-11-26 DIAGNOSIS — F4320 Adjustment disorder, unspecified: Secondary | ICD-10-CM | POA: Diagnosis not present

## 2014-11-26 DIAGNOSIS — N186 End stage renal disease: Secondary | ICD-10-CM | POA: Diagnosis not present

## 2014-11-26 DIAGNOSIS — I5022 Chronic systolic (congestive) heart failure: Secondary | ICD-10-CM | POA: Diagnosis not present

## 2014-11-26 DIAGNOSIS — E785 Hyperlipidemia, unspecified: Secondary | ICD-10-CM | POA: Diagnosis not present

## 2014-11-26 DIAGNOSIS — H540 Blindness, both eyes: Secondary | ICD-10-CM | POA: Diagnosis not present

## 2014-11-26 DIAGNOSIS — F29 Unspecified psychosis not due to a substance or known physiological condition: Secondary | ICD-10-CM | POA: Diagnosis not present

## 2014-11-26 DIAGNOSIS — S0990XA Unspecified injury of head, initial encounter: Secondary | ICD-10-CM | POA: Diagnosis not present

## 2014-11-26 DIAGNOSIS — N390 Urinary tract infection, site not specified: Secondary | ICD-10-CM | POA: Diagnosis present

## 2014-11-26 DIAGNOSIS — E1322 Other specified diabetes mellitus with diabetic chronic kidney disease: Secondary | ICD-10-CM | POA: Diagnosis not present

## 2014-11-26 DIAGNOSIS — E861 Hypovolemia: Secondary | ICD-10-CM | POA: Diagnosis present

## 2014-11-26 DIAGNOSIS — D649 Anemia, unspecified: Secondary | ICD-10-CM | POA: Diagnosis not present

## 2014-11-26 DIAGNOSIS — E669 Obesity, unspecified: Secondary | ICD-10-CM | POA: Diagnosis present

## 2014-11-26 LAB — CBC
HCT: 26.6 % — ABNORMAL LOW (ref 36.0–46.0)
Hemoglobin: 8.2 g/dL — ABNORMAL LOW (ref 12.0–15.0)
MCH: 30.1 pg (ref 26.0–34.0)
MCHC: 30.8 g/dL (ref 30.0–36.0)
MCV: 97.8 fL (ref 78.0–100.0)
Platelets: 224 10*3/uL (ref 150–400)
RBC: 2.72 MIL/uL — ABNORMAL LOW (ref 3.87–5.11)
RDW: 14 % (ref 11.5–15.5)
WBC: 7.6 10*3/uL (ref 4.0–10.5)

## 2014-11-26 LAB — GLUCOSE, CAPILLARY
Glucose-Capillary: 115 mg/dL — ABNORMAL HIGH (ref 70–99)
Glucose-Capillary: 130 mg/dL — ABNORMAL HIGH (ref 70–99)

## 2014-11-26 MED ORDER — ISOSORBIDE MONONITRATE ER 60 MG PO TB24: 60.0000 mg | ORAL_TABLET | Freq: Every day | ORAL | Status: DC

## 2014-11-26 MED ORDER — CLOPIDOGREL BISULFATE 75 MG PO TABS: 75.0000 mg | ORAL_TABLET | Freq: Every day | ORAL | Status: DC

## 2014-11-26 MED ORDER — LISINOPRIL 2.5 MG PO TABS: 2.5000 mg | ORAL_TABLET | Freq: Every day | ORAL | Status: DC

## 2014-11-26 MED ORDER — NITROGLYCERIN 0.4 MG SL SUBL: 0.4000 mg | SUBLINGUAL_TABLET | SUBLINGUAL | Status: DC | PRN

## 2014-11-26 MED ORDER — CLONIDINE HCL 0.1 MG/24HR TD PTWK: 0.1000 mg | MEDICATED_PATCH | TRANSDERMAL | Status: DC

## 2014-11-26 MED ORDER — METOPROLOL SUCCINATE ER 50 MG PO TB24
50.0000 mg | ORAL_TABLET | Freq: Every day | ORAL | Status: DC
Start: 2014-11-26 — End: 2016-07-20

## 2014-11-26 NOTE — Discharge Summary (Signed)
Physician Discharge Summary  Patient ID: [name] MRN: [id number] DOB/AGE: August 05, 1956 58 y.o.  Admit date: 11/19/2014 Discharge date: 11/26/2014  Primary Care Physician:  No primary care provider on file.  Discharge Diagnoses:   . Anterior wall infarct/ MI . Acute respiratory failure with hypoxia . Cardiomyopathy, ischemic-EF 30-35%   ESRD on hemodialysis MWF   . Essential hypertension . HLD (hyperlipidemia) . Diabetes mellitus with ESRD (end-stage renal disease) . Anemia . Nonpsychotic mental disorder . Blind  Consults: Cardiology, Dr. Stanford Breed Nephrology   Recommendations for Outpatient Follow-up:  She would benefit [facility] as they may be able to provide assistive devices and assist to help increase her independence and function.   Allergies:  No Known Allergies   Discharge Medications:   Medication List    STOP taking these medications        cloNIDine 0.2 mg/24hr patch  Commonly known as:  CATAPRES - Dosed in mg/24 hr  Replaced by:  cloNIDine 0.1 mg/24hr patch     metoprolol 100 MG tablet  Commonly known as:  LOPRESSOR      TAKE these medications        aspirin 81 MG tablet  Take 81 mg by mouth daily. For hypertension     atorvastatin 10 MG tablet  Commonly known as:  LIPITOR  Take 10 mg by mouth daily.     bisacodyl 10 MG suppository  Commonly known as:  DULCOLAX  Place 1 suppository (10 mg total) rectally daily as needed for mild constipation, moderate constipation or severe constipation.     calcium acetate 667 MG capsule  Commonly known as:  PHOSLO  Take 1 capsule (667 mg total) by mouth 3 (three) times daily with meals.     cloNIDine 0.1 mg/24hr patch  Commonly known as:  CATAPRES - Dosed in mg/24 hr  Place 1 patch (0.1 mg total) onto the skin once a week. For this week, then off.     clopidogrel 75 MG tablet  Commonly known as:  PLAVIX  Take 1 tablet (75 mg total) by mouth daily.     darbepoetin 100  MCG/0.5ML Soln injection  Commonly known as:  ARANESP  Inject 0.5 mLs (100 mcg total) into the skin every Tuesday at 6 PM.     DSS 100 MG Caps  Take 100 mg by mouth 2 (two) times daily. For constipation     hydrALAZINE 100 MG tablet  Commonly known as:  APRESOLINE  Take 1 tablet (100 mg total) by mouth every 8 (eight) hours.     isosorbide mononitrate 60 MG 24 hr tablet  Commonly known as:  IMDUR  Take 1 tablet (60 mg total) by mouth daily.     lactulose 10 GM/15ML solution  Commonly known as:  CHRONULAC  Take 30 mLs (20 g total) by mouth 2 (two) times daily as needed for moderate constipation.     levalbuterol 0.63 MG/3ML nebulizer solution  Commonly known as:  XOPENEX  Take 3 mLs (0.63 mg total) by nebulization every 2 (two) hours as needed for wheezing or shortness of breath.     lisinopril 2.5 MG tablet  Commonly known as:  PRINIVIL,ZESTRIL  Take 1 tablet (2.5 mg total) by mouth daily.     metoprolol succinate 50 MG 24 hr tablet  Commonly known as:  TOPROL-XL  Take 1 tablet (50 mg total) by mouth daily. Take with or immediately following a meal.     b complex-vitamin  c-folic acid 0.8 MG Tabs tablet  Take 1 tablet by mouth daily.     multivitamin Tabs tablet  Take 1 tablet by mouth at bedtime.     naphazoline-pheniramine 0.025-0.3 % ophthalmic solution  Commonly known as:  NAPHCON-A  Place 1 drop into both eyes 4 (four) times daily as needed for irritation.     nitroGLYCERIN 0.4 MG SL tablet  Commonly known as:  NITROSTAT  Place 1 tablet (0.4 mg total) under the tongue every 5 (five) minutes as needed for chest pain.     promethazine 12.5 MG suppository  Commonly known as:  PHENERGAN  Place 12.5 mg rectally every 4 (four) hours as needed for nausea or vomiting.     risperiDONE microspheres 25 MG injection  Commonly known as:  RISPERDAL CONSTA  Inject 25 mg into the muscle every 14 (fourteen) days. For psychosis         Brief H and P: For complete details  please refer to admission H and P, but in brief per admission note by Dr Blaine Hamper on 11/20/14  [name] is a 58 y.o. female with past medical history of end-stage renal disease on dialysis (M/W/F), deafness, blindness, diabetes mellitus, anemia, hypertension, hyperlipidemia, who presented with shortness of breath and chest pain. Patient is from a nursing home. History was obtained from patient via sign language translator, and also from her son and daughter as patient is deaf, mute and blind. They stated that patient had worsening shortness of breath in the past 2 weeks which had been progressively getting worse. She was having wheezing and mild dry cough per her son. Patient had subjective fever and chills. She also reported that she had chest pain a day before, which is better today, no current chest pain, but continued dyspnea. She has some mild nausea, but no vomiting or diarrhea. She denies abdominal pain, diarrhea, dysuria, urgency, frequency, hematuria, skin rashes, or leg swelling. When I evaluated pt in ED, she is on BIPAP with some variable tolerance.   In ED, she was found to have pulmonary edema on chest x-ray, potassium 6.7, troponin elevation at 0.70, proBNP 19448, temperature 97.9. She was given with insulin, kayexalate, sodium bicarbonate in ED. Cardiology was consulted. Heparin gtt and Lasix 120 mg IV were ordered by Cardiology, Dr. Claiborne Billings. Renal was consulted for urgent dialysis  Hospital Course:   58 y.o. female with past medical history of end-stage renal disease on dialysis (M/W/F), deafness, blindness, diabetes mellitus, anemia, hypertension, and hyperlipidemia who presented with shortness of breath and chest pain. Patient presented from nursing home, History was obtained from patient via sign language translator, and also from her son and daughter. They stated the patient had worsening shortness of breath over 2 weeks. She had been having wheezing and mild dry cough per her son. She  also reported chest pain transiently the day prior to her admit. She had some mild nausea, but no vomiting or diarrhea.  In ED, she was found to have pulmonary edema on chest x-ray, potassium 6.7, troponin elevation at 0.70, temperature 97.9. She was given insulin, kayexalate, sodium bicarbonate in ED. Cardiology was consulted. Renal was consulted for urgent dialysis. Patient was diagnosed with non-ST elevated MI, started on IV heparin drip initially, which was stopped on 11/30.  Acute large anterior MI Patient was admitted, troponin peaked at 1.63, cardiology was consulted. She was placed on IV heparin drip which was stopped on 11/30.  Patient underwent nuclear medicine stress test 12/1 which showed  a fixed defects involving the apex, anterior lateral wall and inferior wall. The apex and anterior lateral wall fixed defects are suggestive of infarct, no evidence of reversibility or ischemia, diffuse hypokinesia and minimal wall motion along the lateral wall, EF 35%.  Per Cardiology, given multiple medical problems, plan medical therapy, continue aspirin, statin, hydralazine and nitrates. Medication adjustments were done, mean clonidine patch to off Plavix and ACE inhibitor was added. Metoprolol was changed to Toprol.  Acute systolic CHF due to ischemia - Pulmonary edema with volume overload 2-D echo showed EF 30-35% , nephrology was consulted and patient underwent emergent hemodialysis with 5 L removed.  Patient had back-to-back hemodialysis on 11/30, 12/1 and 12/2 regular hemodialysis  day per her schedule MWF. iscussed with nephrology, Dr. Melvia Heaps, next hemodialysis on Friday.  Hyperkalemia  Resolved after hemodialysis  Transaminitis  Possibly secondary to shock liver due to MI versus passive congestion related to acute CHF, LFTs have normalized 11/30   ESRD on HD (M/W/F) Dialyzed emergently on admission, Nephrology following, patient undergoing hemodialysis today, next hemodialysis on Friday    HTN BP controlled, [place] term goal will be SBP closer to 120, Nephrology considering drop in [place]   Deaf / Legally blind Family reported her visual loss is relatively recent following opthalmologic procedures at [facility]  Diabetes mellitus CBG currently reasonably controlled   Anemia of ESRD Hgb stable - epo and Fe per Nephrology   Day of Discharge BP 135/69 mmHg  Pulse 83  Temp(Src) 98.4 F (36.9 C) (Oral)  Resp 18  Ht 5\' 4"  (1.626 m)  Wt 78 kg (171 lb 15.3 oz)  BMI 29.50 kg/m2  SpO2 100%  Physical Exam: General: Alert and awake oriented, seen with interpreter  HEENT: anicteric sclera, pupils reactive to light and accommodation CVS: S1-S2 clear no murmur rubs or gallops Chest: clear to auscultation bilaterally, no wheezing rales or rhonchi Abdomen: soft nontender, nondistended, normal bowel sounds Extremities: no cyanosis, clubbing or edema noted bilaterally    The results of significant diagnostics from this hospitalization (including imaging, microbiology, ancillary and laboratory) are listed below for reference.    LAB RESULTS: Basic Metabolic Panel:  Recent Labs Lab 11/24/14 0325 11/25/14 0610  NA 132* 134*  K 4.4 4.4  CL 92* 94*  CO2 25 27  GLUCOSE 99 125*  BUN 40* 36*  CREATININE 4.51* 4.09*  CALCIUM 8.7 8.9  PHOS 6.0*  --    Liver Function Tests:  Recent Labs Lab 11/19/14 2335  11/23/14 0329 11/23/14 0906 11/24/14 0325  AST 60*  --  12  --   --   ALT 68*  --  26  --   --   ALKPHOS 142*  --  96  --   --   BILITOT 0.3  --  0.4  --   --   PROT 8.6*  --  7.4  --   --   ALBUMIN 3.5  < > 3.1*  3.2* 3.4* 3.0*  < > = values in this interval not displayed. No results for input(s): LIPASE, AMYLASE in the last 168 hours. No results for input(s): AMMONIA in the last 168 hours. CBC:  Recent Labs Lab 11/25/14 0610 11/26/14 0359  WBC 9.6 7.6  HGB 8.8* 8.2*  HCT 28.1* 26.6*  MCV 96.9 97.8  PLT 251 224   Cardiac  Enzymes:  Recent Labs Lab 11/20/14 1918 11/21/14 0156  TROPONINI 1.30* 0.81*   BNP: Invalid input(s): POCBNP CBG:  Recent  Labs Lab 11/25/14 2129 11/26/14 0516  GLUCAP 81 115*    Significant Diagnostic Studies:  Dg Chest 2 View  11/20/2014   CLINICAL DATA:  Respiratory distress and chest pain.  EXAM: CHEST  2 VIEW  COMPARISON:  01/02/2014.  FINDINGS: Mild cardiac enlargement. Small bilateral pleural effusions and mild to moderate interstitial edema is identified. No focal airspace consolidation identified. Spondylosis noted within the thoracic spine.  IMPRESSION: 1. Mild to moderate CHF.   Electronically Signed   By: Kerby Moors M.D.   On: 11/20/2014 00:34    2D ECHO: Study Conclusions  - Left ventricle: The cavity size was normal. There was moderate concentric hypertrophy. Systolic function was moderately to severely reduced. The estimated ejection fraction was in the range of 30% to 35%. Dyskinesis and aneurysmal deformity of the mid-apicalanteroseptal, anterior, inferior, inferoseptal, and apical myocardium; consistent with infarction in the distribution of the left anterior descending coronary artery. Features are consistent with a pseudonormal left ventricular filling pattern, with concomitant abnormal relaxation and increased filling pressure (grade 2 diastolic dysfunction). - Mitral valve: Severely calcified annulus. - Left atrium: The atrium was moderately dilated. - Pulmonary arteries: Systolic pressure was mildly increased. PA peak pressure: 44 mm Hg (S).  Disposition and Follow-up:     Discharge Instructions    Diet Carb Modified    Complete by:  As directed      Increase activity slowly    Complete by:  As directed             DISPOSITION: Skilled nursing facility  DIET carb modified diet     DISCHARGE FOLLOW-UP Follow-up Information    Follow up with Minus Breeding, MD On 12/02/2014.   Specialty:  Cardiology   Why:   10:30 am Tarri Fuller will see)   Contact information:   [address] [id number] [phone]       Time spent on Discharge: 40 mins  Signed:   RAI,RIPUDEEP M.D. [facility] 11/26/2014, 10:23 AM Pager: [phone]

## 2014-11-26 NOTE — Progress Notes (Signed)
[facility] Progress Note   Subjective: stable  Filed Vitals:   11/25/14 1938 11/26/14 0422 11/26/14 1029 11/26/14 1034  BP: 97/50 135/69 111/55   Pulse: 77 83 81   Temp: 97.9 F (36.6 C) 98.4 F (36.9 C) 98.2 F (36.8 C)   TempSrc: Oral Oral Oral   Resp: 16 18 17    Height:      Weight:  78 kg (171 lb 15.3 oz)    SpO2: 100% 100% 100% 100%   Exam: Calm, no distress No jvd Chest clear bilat RRR no MRG Abd soft, NTND, +BS No LE edema Neuro nonfocal, mute LUA AVF +bruit  HD: MWF [facility] 4h 81min   80.5kg  2/2.5 Bath   Heparin 9000  AVF LUA Aranesp 25 ug/wk,  Hect 1 mcg TIW, Venofer 100 / wk  Assessment: 1. Pulm edema/ resp distress / Raliegh Ip - resolved 2. ESRD on HD 3. S/P MI - large WMA ant wall, per cardiology 4. HTN/vol - clon/ hydral / MTP, down 8kg from admit and 2kg under previous dry wt 5. Anemia of ESRD - ^'d aranesp 40, Hb 8.6 6. MBD- cont meds 7. DM per primary  Plan- for dc today. New dry wt 78, should challenge at OP unit as well.     Kelly Splinter MD  pager [phone]    cell [phone]  11/26/2014, 11:12 AM     Recent Labs Lab 11/23/14 0329 11/23/14 0906 11/24/14 0325 11/25/14 0610  NA 126* 125* 132* 134*  K 5.9* 5.4* 4.4 4.4  CL 85* 84* 92* 94*  CO2 24 23 25 27   GLUCOSE 118* 136* 99 125*  BUN 75* 78* 40* 36*  CREATININE 7.05* 7.02* 4.51* 4.09*  CALCIUM 8.8 8.9 8.7 8.9  PHOS 7.4* 7.1* 6.0*  --     Recent Labs Lab 11/19/14 2335  11/23/14 0329 11/23/14 0906 11/24/14 0325  AST 60*  --  12  --   --   ALT 68*  --  26  --   --   ALKPHOS 142*  --  96  --   --   BILITOT 0.3  --  0.4  --   --   PROT 8.6*  --  7.4  --   --   ALBUMIN 3.5  < > 3.1*  3.2* 3.4* 3.0*  < > = values in this interval not displayed.  Recent Labs Lab 11/24/14 0325 11/25/14 0610 11/26/14 0359  WBC 7.2 9.6 7.6  HGB 8.6* 8.8* 8.2*  HCT 27.2* 28.1* 26.6*  MCV 96.5 96.9 97.8  PLT 233 251 224   . aspirin  81 mg Oral Daily  . atorvastatin  80 mg  Oral Q2000  . calcium acetate  667 mg Oral TID WC  . cloNIDine  0.1 mg Transdermal Weekly  . clopidogrel  75 mg Oral Daily  . darbepoetin (ARANESP) injection - DIALYSIS  40 mcg Intravenous Q Mon-HD  . docusate sodium  100 mg Oral BID  . doxercalciferol  1 mcg Oral Q M,W,F-HD  . ferric gluconate (FERRLECIT/NULECIT) IV  125 mg Intravenous Q Mon-HD  . heparin subcutaneous  5,000 Units Subcutaneous 3 times per day  . heparin  6,000 Units Dialysis Once in dialysis  . hydrALAZINE  100 mg Oral 3 times per day  . insulin aspart  0-9 Units Subcutaneous TID WC  . isosorbide mononitrate  60 mg Oral Daily  . lisinopril  2.5 mg Oral Daily  . metoprolol succinate  50 mg Oral Daily  .  multivitamin  1 tablet Oral QHS  . [START ON 11/29/2014] risperiDONE microspheres  25 mg Intramuscular Q14 Days     sodium chloride, sodium chloride, bisacodyl, feeding supplement (NEPRO CARB STEADY), heparin, lactulose, levalbuterol, lidocaine (PF), lidocaine-prilocaine, morphine injection, naphazoline-pheniramine, nitroGLYCERIN, ondansetron (ZOFRAN) IV, pentafluoroprop-tetrafluoroeth, promethazine

## 2014-11-26 NOTE — Consult Note (Signed)
Chart review: 11/05 - acute blood loss anemia, menometrorrhagia, uterine fibroids 10/10 - uncontrolled HTN and DM rx with BP meds and insulin; also seen by psych for question of paranoia about seeing [facility] providers but was not found to have any frank psychosis. Also deaf and communicated with sign language 1/11 - RLE edema admitted for possible cellulitis, dopplers negative, dc'd home. DM with foot ulcer R third toe, HTN, deafness 12/14 - SOB, gen weakness, SVT w rates 170's > admitted , rx with empiric AB for PNA, renal failure creat 3.5, vol excess not responding to diuretics so started on HD. Improved with HD. DC'd to SNF, outpatiinet HD set up.   Kelly Splinter MD (pgr) [phone]    [phone] 11/26/2014, 11:21 AM

## 2014-11-26 NOTE — Progress Notes (Signed)
Ok per MD for d/c today back to [facility].  Notified Gayla Medicus, RN [facility] of [facility] and patient's daughter [name] of d/c. Daughter is very pleased.  Patient is deaf/blind/mute and CSW was unable to communicate with her.  Interpreter aware of d/c and stated she would attempt to make patient aware of d/c plan. Nursing notified to call report.  CSW signing off.  Lorie Phenix. Pauline Good, [place]

## 2014-11-26 NOTE — Clinical Social Work Psychosocial (Addendum)
    Clinical Social Work Department BRIEF PSYCHOSOCIAL ASSESSMENT 11/19/2014  Patient:  ERNESTINA, FLASH     Account Number:  [id number]     Admit date:  11/19/2014  Clinical Social Worker:  Elam Dutch  Date/Time:  11/20/2014 03:55 PM  Referred by:  Physician  Date Referred:  11/19/2014 Referred for  Other - See comment   Other Referral:   Return to SNF   Interview type:  Other - See comment Other interview type:   Message left for daughter Benita    PSYCHOSOCIAL DATA Living Status:  FACILITY Admitted from facility:  [facility] [place] Level of care:  [place] Primary support name:  Benita Texidor  [phone] Primary support relationship to patient:  CHILD, ADULT Degree of support available:   Strong support    CURRENT CONCERNS Current Concerns  Other - See comment   Other Concerns:   REturn to SNF    SOCIAL WORK ASSESSMENT / PLAN 58 year old female- [place] term care resident of [place]. Patient is deaf, blind and mute. Able to utilize an interpreter for very simplistic tranlations but is "learning".  CSW notified Tammy Shary Decamp- RN [facility] of [facility]- she will accept patient back when medically stable.  Fl2 place on chart for MD's signature.  Attempting to reach patient's daughter.   Assessment/plan status:  Psychosocial Support/Ongoing Assessment of Needs Other assessment/ plan:   Information/referral to community resources:   none at this time    PATIENT'S/FAMILY'S RESPONSE TO PLAN OF CARE: CSW is unable to talk with patient as she is deaf,blind and mute. She receives translator services and some communication is possible such as "hungry", "cold", etc. Plan return to facility when stable.  CSW will discuss further to daughter once d/c able to contact.  CSW will follow; have worked with this patient and family in the past.

## 2014-12-01 ENCOUNTER — Non-Acute Institutional Stay (SKILLED_NURSING_FACILITY): Payer: Medicare Other | Admitting: Adult Health

## 2014-12-01 ENCOUNTER — Encounter: Payer: Self-pay | Admitting: Adult Health

## 2014-12-01 DIAGNOSIS — I255 Ischemic cardiomyopathy: Secondary | ICD-10-CM | POA: Diagnosis not present

## 2014-12-01 DIAGNOSIS — J9601 Acute respiratory failure with hypoxia: Secondary | ICD-10-CM

## 2014-12-01 DIAGNOSIS — N186 End stage renal disease: Secondary | ICD-10-CM | POA: Diagnosis not present

## 2014-12-01 DIAGNOSIS — E785 Hyperlipidemia, unspecified: Secondary | ICD-10-CM | POA: Diagnosis not present

## 2014-12-01 DIAGNOSIS — I10 Essential (primary) hypertension: Secondary | ICD-10-CM

## 2014-12-01 DIAGNOSIS — E1322 Other specified diabetes mellitus with diabetic chronic kidney disease: Secondary | ICD-10-CM | POA: Diagnosis not present

## 2014-12-01 DIAGNOSIS — I2109 ST elevation (STEMI) myocardial infarction involving other coronary artery of anterior wall: Secondary | ICD-10-CM | POA: Diagnosis not present

## 2014-12-01 DIAGNOSIS — E1122 Type 2 diabetes mellitus with diabetic chronic kidney disease: Secondary | ICD-10-CM

## 2014-12-01 DIAGNOSIS — F489 Nonpsychotic mental disorder, unspecified: Secondary | ICD-10-CM | POA: Diagnosis not present

## 2014-12-01 DIAGNOSIS — Z992 Dependence on renal dialysis: Secondary | ICD-10-CM

## 2014-12-01 NOTE — Progress Notes (Signed)
Patient ID: [name], female   DOB: 1956-09-11, 58 y.o.   MRN: [id number]  [facility]     No Known Allergies     Chief Complaint  Patient presents with  . Hospitalization Follow-up    HPI:  She is a [place] term resident of this facility who has been hospitalized for an acute anterior wall mi; acute respiratory failure and ischemic cardiomyopathy with an ef of 35-35%. She is unable to participate in the hpi as she is both blind and deaf. There are no nursing concerns being voiced at this time.    Past Medical History  Diagnosis Date  . Hyperlipidemia   . Hypertension     a. Has previously refused blood pressure meds.   . Deaf     Divorced from husband but lives with him. Has daughter but she does not care for her.  . Bilateral leg edema     a. Chronic.  Marland Kitchen Psychiatric disorder     She frequently exhibits paranoia and has been diagnosed with psychotic d/o NOS during hospital stay in the past. She is tangetial and perseverative during her visits. She apparently has had a bad experience with mental health in [place] in the past and refuses to discuss mental issues for fear that she will be sent back there. Her paranoia, communication issues, financial woes and lack of fam  . Fibroid uterus   . Anemia     Due to fibroids.  . Heart murmur   . Diabetes mellitus     a. Per PCP note 2012 (A1C 9.2) - pt unwilling to take meds and was educated on risk of uncontrolled DM.  b. A1C 5.9 in 11/2013.    Past Surgical History  Procedure Laterality Date  . No past surgeries    . Insertion of dialysis catheter Right 01/02/2014    Procedure: INSERTION OF DIALYSIS CATHETER;  Surgeon: Rosetta Posner, MD;  Location: [place];  Service: Vascular;  Laterality: Right;  . Av fistula placement Left 01/02/2014    Procedure: ARTERIOVENOUS (AV) FISTULA CREATION;  Surgeon: Rosetta Posner, MD;  Location: [facility] OR;  Service: Vascular;  Laterality: Left;    VITAL SIGNS BP 112/60 mmHg  Pulse 69  Ht 5' 8"  (1.727 m)  Wt  178 lb (80.74 kg)  BMI 27.07 kg/m2   Outpatient Encounter Prescriptions as of 12/01/2014  Medication Sig  . aspirin 81 MG tablet Take 81 mg by mouth daily. For hypertension  . atorvastatin (LIPITOR) 10 MG tablet Take 10 mg by mouth daily.  . bisacodyl (DULCOLAX) 10 MG suppository Place 1 suppository (10 mg total) rectally daily as needed for mild constipation, moderate constipation or severe constipation.  . calcium acetate (PHOSLO) 667 MG capsule Take 1 capsule (667 mg total) by mouth 3 (three) times daily with meals.  . cloNIDine (CATAPRES - DOSED IN MG/24 HR) 0.1 mg/24hr patch Place 1 patch (0.1 mg total) onto the skin once a week. For this week, then off.  . clopidogrel (PLAVIX) 75 MG tablet Take 1 tablet (75 mg total) by mouth daily.  . darbepoetin (ARANESP) 100 MCG/0.5ML SOLN injection Inject 0.5 mLs (100 mcg total) into the skin every Tuesday at 6 PM.  . Docusate Sodium (DSS) 100 MG CAPS Take 100 mg by mouth 2 (two) times daily. For constipation  . hydrALAZINE (APRESOLINE) 100 MG tablet Take 1 tablet (100 mg total) by mouth every 8 (eight) hours.  . isosorbide mononitrate (IMDUR) 60 MG 24 hr tablet Take 1  tablet (60 mg total) by mouth daily.  Marland Kitchen lactulose (CHRONULAC) 10 GM/15ML solution Take 30 mLs (20 g total) by mouth 2 (two) times daily as needed for moderate constipation.  Marland Kitchen levalbuterol (XOPENEX) 0.63 MG/3ML nebulizer solution Take 3 mLs (0.63 mg total) by nebulization every 2 (two) hours as needed for wheezing or shortness of breath.  . lisinopril (PRINIVIL,ZESTRIL) 2.5 MG tablet Take 1 tablet (2.5 mg total) by mouth daily.  . metoprolol succinate (TOPROL-XL) 50 MG 24 hr tablet Take 1 tablet (50 mg total) by mouth daily. Take with or immediately following a meal.  . multivitamin (RENA-VIT) TABS tablet Take 1 tablet by mouth at bedtime.  . naphazoline-pheniramine (NAPHCON-A) 0.025-0.3 % ophthalmic solution Place 1 drop into both eyes 4 (four) times daily as needed for irritation.  .  nitroGLYCERIN (NITROSTAT) 0.4 MG SL tablet Place 1 tablet (0.4 mg total) under the tongue every 5 (five) minutes as needed for chest pain.  . promethazine (PHENERGAN) 12.5 MG suppository Place 12.5 mg rectally every 4 (four) hours as needed for nausea or vomiting.  . risperiDONE microspheres (RISPERDAL CONSTA) 25 MG injection Inject 25 mg into the muscle every 14 (fourteen) days. For psychosis  . [DISCONTINUED] b complex-vitamin c-folic acid (NEPHRO-VITE) 0.8 MG TABS tablet Take 1 tablet by mouth daily.     SIGNIFICANT DIAGNOSTIC EXAMS  11-20-14: chest x-ray: 1. Mild to moderate CHF.  11-20-14: 2-d echo: Left ventricle: The cavity size was normal. There was moderate concentric hypertrophy. Systolic function was moderately to severely reduced. The estimated ejection fraction was in the range of 30% to 35%. Dyskinesis and aneurysmal deformity of the mid-apicalanteroseptal, anterior, inferior, inferoseptal, and apical myocardium; consistent with infarction in the distribution of the left anterior descending coronary artery. Features are consistent with a pseudonormal left ventricular filling pattern, with concomitant abnormal relaxation and increased filling pressure - Mitral valve: Severely calcified annulus. - Left atrium: The atrium was moderately dilated. - Pulmonary arteries: Systolic pressure was mildly increased.  11-24-14: Myoview: 1. Fixed defects involving the apex, anterior lateral wall and the inferior wall. The apex and anterior lateral wall fixed defects aresuggestive for an infarct. No evidence for reversibility or ischemia. 2. Diffuse hypokinesia and minimal wall motion along the lateral wall. 3. Left ventricular ejection fraction is 35%. 4. High-risk stress test findings*.   LABS REVIEWED:   11-19-14: wbc 10.1; hgb .6; hct 29.1; mcv 93.6; plt 272; glucose 260; bun 83; creat 6.65; k+6.7; na++128; alk phos 142; ast 60; lat 68; t pro 8.6; albumin 3.5 11-21-14: wbc 8.2; hgb 9.0;  hct 27.7; mcv 97.2; plt 251; glucose 166; bun 45; creat 4.77; k+5.0; na++134; phos 5.2; albumin 3.2; hgb a1c 6.0 11-24-14: wbc 7.2; hgb 8.6; hct 27.2; mcv 96.5; plt 233; glucose 99; bun 40; creat 4.51; k+4.4; na++132; phos 6.0; albumin 3.0     Review of Systems  Unable to perform ROS    Physical Exam  Constitutional: She appears well-developed and well-nourished. No distress.  Over weight   Neck: Neck supple. No JVD present. No thyromegaly present.  Cardiovascular: Normal rate, regular rhythm and intact distal pulses.   Respiratory: Effort normal and breath sounds normal. No respiratory distress. She has no wheezes.  GI: Soft. Bowel sounds are normal. She exhibits no distension. There is no tenderness.  Musculoskeletal: She exhibits no edema.  Is able to move all extremities   Neurological: She is alert.  Skin: Skin is warm and dry. She is not diaphoretic.  A/v fistula left upper  arm +thril +bruit        ASSESSMENT/ PLAN:  1. ESRD: is on hemodialysis three days per week. Will continue phoslo 667 mg three times daily will monitor her status.   2. Dyslipidemia: will continue lipitor 80 mg daily   3. Hypertension: will continue clonidine 0.1 mg patch weekly; hydralazine 100 mg every 8 hours; lisinopril 2.5 mg daily and toprol xl 50 mg daily   4. Anterior wall MI: no chest pain present; will continue imdur 60 mg daily; asa 81 mg daily plavix 75 mg daily and ntg prn will monitor   5. Ischemic cardiomyopathy: ef is 30-35%; she is presently stable will continue hydralazine 100 mg three times daily; will continue to monitor weight at dialysis will monitor her status   6. Anemia due to ckd: will continue aranesp at dialysis  7. Psychosis: she is emotionally stable will continue risperdal consta 25 mg every 2 weeks.   8. Constipation: will continue colace twice daily and chronulac 30 cc twice daily as needed   9. Acute respiratory failure: she is presently stable is on room air;  will continue xopenex every 2 hours as needed   Will check cbg every hs for one week and report.    Time spent with patient 50 minutes   Ok Edwards NP [facility]  Contact [phone] Monday through Friday 8am- 5pm  After hours call [phone]

## 2014-12-02 ENCOUNTER — Encounter: Payer: Self-pay | Admitting: Internal Medicine

## 2014-12-02 ENCOUNTER — Ambulatory Visit: Payer: Self-pay | Admitting: Physician Assistant

## 2014-12-02 ENCOUNTER — Non-Acute Institutional Stay (SKILLED_NURSING_FACILITY): Payer: Medicare Other | Admitting: Internal Medicine

## 2014-12-02 DIAGNOSIS — E1322 Other specified diabetes mellitus with diabetic chronic kidney disease: Secondary | ICD-10-CM

## 2014-12-02 DIAGNOSIS — E785 Hyperlipidemia, unspecified: Secondary | ICD-10-CM

## 2014-12-02 DIAGNOSIS — I255 Ischemic cardiomyopathy: Secondary | ICD-10-CM

## 2014-12-02 DIAGNOSIS — I5032 Chronic diastolic (congestive) heart failure: Secondary | ICD-10-CM

## 2014-12-02 DIAGNOSIS — I10 Essential (primary) hypertension: Secondary | ICD-10-CM | POA: Diagnosis not present

## 2014-12-02 DIAGNOSIS — I2109 ST elevation (STEMI) myocardial infarction involving other coronary artery of anterior wall: Secondary | ICD-10-CM | POA: Diagnosis not present

## 2014-12-02 DIAGNOSIS — Z992 Dependence on renal dialysis: Secondary | ICD-10-CM

## 2014-12-02 DIAGNOSIS — N186 End stage renal disease: Secondary | ICD-10-CM

## 2014-12-02 DIAGNOSIS — E1122 Type 2 diabetes mellitus with diabetic chronic kidney disease: Secondary | ICD-10-CM

## 2014-12-02 NOTE — Progress Notes (Signed)
Patient ID: [name], female   DOB: November 03, 1956, 58 y.o.   MRN: [id number]   Provider:  Rexene Edison. Mariea Clonts, D.O., C.M.D. Location:  [facility]  PCP: No primary care provider on file.  Code Status: full code  No Known Allergies  Chief Complaint  Patient presents with  . Readmit To SNF    HPI: 58 y.o. 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  She was treated with heparin IV through 11/30, lasix, and seen by cardiology.  She had urgent HD.    nuclear medicine stress test 12/1 showed a fixed defects involving the apex, anterior lateral wall and inferior wall. The apex and anterior lateral wall fixed defects suggested infarct, no evidence of reversibility or ischemia, diffuse hypokinesia and minimal wall motion along the lateral wall, EF 35%.   Clonidine and metoprolol were stopped.  She was continued on her aspirin, statin, hydralazine, and nitrates.  She had acute systolic chf.    She was supposed to have seen cardiology this am, Dr Percival Spanish per the d/c summary; however, it appears she missed this and it will need to be rescheduled.     ROS: Review of Systems  Unable to perform ROS: patient nonverbal     Past Medical History  Diagnosis Date  . Hyperlipidemia   . Hypertension     a. Has previously refused blood pressure meds.   . Deaf     Divorced from husband but lives with him. Has daughter but she does not care for her.  . Bilateral leg edema     a. Chronic.  Marland Kitchen Psychiatric disorder     She frequently exhibits paranoia and has been diagnosed with psychotic d/o NOS during hospital stay in the past. She is tangetial and perseverative during her visits. She apparently has had a bad experience with  mental health in [place] in the past and refuses to discuss mental issues for fear that she will be sent back there. Her paranoia, communication issues, financial woes and lack of fam  . Fibroid uterus   . Anemia     Due to fibroids.  . Heart murmur   . Diabetes mellitus     a. Per PCP note 2012 (A1C 9.2) - pt unwilling to take meds and was educated on risk of uncontrolled DM.  b. A1C 5.9 in 11/2013.   Past Surgical History  Procedure Laterality Date  . No past surgeries    . Insertion of dialysis catheter Right 01/02/2014    Procedure: INSERTION OF DIALYSIS CATHETER;  Surgeon: Rosetta Posner, MD;  [facility];  Service: Vascular;  Laterality: Right;  . Av fistula placement Left 01/02/2014    Procedure: ARTERIOVENOUS (AV) FISTULA CREATION;  Surgeon: Rosetta Posner, MD;  Location: [place];  Service: Vascular;  Laterality: Left;   Social History:   reports that she has never smoked. She does not have any smokeless tobacco history on file. She reports that she does not drink alcohol or use illicit drugs.  Family History  Problem Relation Age of Onset  . Other Father     Drowned from fishing?  . Heart disease      Medications: Patient's Medications  New Prescriptions   No  medications on file  Previous Medications   ASPIRIN 81 MG TABLET    Take 81 mg by mouth daily. For hypertension   ATORVASTATIN (LIPITOR) 10 MG TABLET    Take 10 mg by mouth daily.   BISACODYL (DULCOLAX) 10 MG SUPPOSITORY    Place 1 suppository (10 mg total) rectally daily as needed for mild constipation, moderate constipation or severe constipation.   CALCIUM ACETATE (PHOSLO) 667 MG CAPSULE    Take 1 capsule (667 mg total) by mouth 3 (three) times daily with meals.   CLONIDINE (CATAPRES - DOSED IN MG/24 HR) 0.1 MG/24HR PATCH    Place 1 patch (0.1 mg total) onto the skin once a week. For this week, then off.   CLOPIDOGREL (PLAVIX) 75 MG TABLET    Take 1 tablet (75 mg total) by mouth daily.   DARBEPOETIN (ARANESP) 100 MCG/0.5ML  SOLN INJECTION    Inject 0.5 mLs (100 mcg total) into the skin every Tuesday at 6 PM.   DOCUSATE SODIUM (DSS) 100 MG CAPS    Take 100 mg by mouth 2 (two) times daily. For constipation   HYDRALAZINE (APRESOLINE) 100 MG TABLET    Take 1 tablet (100 mg total) by mouth every 8 (eight) hours.   ISOSORBIDE MONONITRATE (IMDUR) 60 MG 24 HR TABLET    Take 1 tablet (60 mg total) by mouth daily.   LACTULOSE (CHRONULAC) 10 GM/15ML SOLUTION    Take 30 mLs (20 g total) by mouth 2 (two) times daily as needed for moderate constipation.   LEVALBUTEROL (XOPENEX) 0.63 MG/3ML NEBULIZER SOLUTION    Take 3 mLs (0.63 mg total) by nebulization every 2 (two) hours as needed for wheezing or shortness of breath.   LISINOPRIL (PRINIVIL,ZESTRIL) 2.5 MG TABLET    Take 1 tablet (2.5 mg total) by mouth daily.   METOPROLOL SUCCINATE (TOPROL-XL) 50 MG 24 HR TABLET    Take 1 tablet (50 mg total) by mouth daily. Take with or immediately following a meal.   MULTIVITAMIN (RENA-VIT) TABS TABLET    Take 1 tablet by mouth at bedtime.   NAPHAZOLINE-PHENIRAMINE (NAPHCON-A) 0.025-0.3 % OPHTHALMIC SOLUTION    Place 1 drop into both eyes 4 (four) times daily as needed for irritation.   NITROGLYCERIN (NITROSTAT) 0.4 MG SL TABLET    Place 1 tablet (0.4 mg total) under the tongue every 5 (five) minutes as needed for chest pain.   PROMETHAZINE (PHENERGAN) 12.5 MG SUPPOSITORY    Place 12.5 mg rectally every 4 (four) hours as needed for nausea or vomiting.   RISPERIDONE MICROSPHERES (RISPERDAL CONSTA) 25 MG INJECTION    Inject 25 mg into the muscle every 14 (fourteen) days. For psychosis  Modified Medications   No medications on file  Discontinued Medications   No medications on file     Physical Exam: Filed Vitals:   12/02/14 1454  BP: 146/76  Pulse: 109  Temp: 98.8 F (37.1 C)  Resp: 24  Height: 5\' 8"  (1.727 m)  Weight: 178 lb (80.74 kg)  SpO2: 94%  Physical Exam  Constitutional: No distress.  HENT:  Head: Normocephalic and  atraumatic.  Right Ear: External ear normal.  Left Ear: External ear normal.  Nose: Nose normal.  Mouth/Throat: Oropharynx is clear and moist.  Eyes: Conjunctivae are normal. Pupils are equal, round, and reactive to light.  Neck: Neck supple. No JVD present.  Cardiovascular: Normal rate and regular rhythm.   HD access with normal thrill and pulse  Pulmonary/Chest: Effort normal and breath sounds  normal.  Abdominal: Soft. Bowel sounds are normal.  Musculoskeletal: She exhibits no edema or tenderness.  Neurological: She is alert.  Skin: Skin is warm and dry.    Labs reviewed: Basic Metabolic Panel:  Recent Labs  12/24/13 1315  11/23/14 0329 11/23/14 0906 11/24/14 0325 11/25/14 0610  NA  --   < > 126* 125* 132* 134*  K  --   < > 5.9* 5.4* 4.4 4.4  CL  --   < > 85* 84* 92* 94*  CO2  --   < > 24 23 25 27   GLUCOSE  --   < > 118* 136* 99 125*  BUN  --   < > 75* 78* 40* 36*  CREATININE  --   < > 7.05* 7.02* 4.51* 4.09*  CALCIUM  --   < > 8.8 8.9 8.7 8.9  MG 2.0  --   --   --   --   --   PHOS  --   < > 7.4* 7.1* 6.0*  --   < > = values in this interval not displayed. Liver Function Tests:  Recent Labs  12/24/13 1315  11/19/14 2335  11/23/14 0329 11/23/14 0906 11/24/14 0325  AST 25  --  60*  --  12  --   --   ALT 22  --  68*  --  26  --   --   ALKPHOS 99  --  142*  --  96  --   --   BILITOT <0.2*  --  0.3  --  0.4  --   --   PROT 5.8*  --  8.6*  --  7.4  --   --   ALBUMIN 1.5*  < > 3.5  < > 3.1*  3.2* 3.4* 3.0*  < > = values in this interval not displayed. CBC:  Recent Labs  12/22/13 1908  12/25/13 0553  11/24/14 0325 11/25/14 0610 11/26/14 0359  WBC 5.3  < > 7.6  < > 7.2 9.6 7.6  NEUTROABS 3.3  --  3.6  --   --   --   --   HGB 10.8*  < > 8.7*  < > 8.6* 8.8* 8.2*  HCT 32.2*  < > 26.3*  < > 27.2* 28.1* 26.6*  MCV 90.2  < > 90.4  < > 96.5 96.9 97.8  PLT 324  < > 311  < > 233 251 224  < > = values in this interval not displayed. Cardiac Enzymes:  Recent  Labs  11/20/14 1439 11/20/14 1918 11/21/14 0156  TROPONINI 1.63* 1.30* 0.81*  CBG:  Recent Labs  11/25/14 2129 11/26/14 0516 11/26/14 1100  GLUCAP 81 115* 130*    Assessment/Plan 1. Anterior wall infarct-(out of hopsital) -see hpi -cont secondary prevention with statin, baby asa, plavix, bp control with imdur, lisinopril, toprol -to receive therapy for strengthening  2. Cardiomyopathy, ischemic-EF 30-35% -noted.  Cont ace, beta blocker per cardiology.  3. Essential hypertension -bp at admission elevated with tachycardia and tachypnea, but improved during visit, monitor and continue current regimen  4. Diabetes mellitus with ESRD (end-stage renal disease) -cont statin, baby asa, plavix, lisinopril, concho HD diet--monitor hba1c  5. End stage renal disease on dialysis -continues HD three times weekly -cont renavit, aranesp, phoslo  6. HLD (hyperlipidemia) -cont statin therapy and monitor flp  7. Chronic diastolic CHF (congestive heart failure) -monitor for acute exacerbation of chf  Functional status:  Requires adl assistance  Family/ staff Communication:  discussed with nurse  Labs/tests ordered:  Reschedule f/u with cardiology, monitor weights

## 2014-12-11 ENCOUNTER — Encounter (HOSPITAL_COMMUNITY): Payer: Self-pay | Admitting: *Deleted

## 2014-12-11 ENCOUNTER — Emergency Department (HOSPITAL_COMMUNITY): Payer: Medicare Other

## 2014-12-11 ENCOUNTER — Inpatient Hospital Stay (HOSPITAL_COMMUNITY)
Admission: EM | Admit: 2014-12-11 | Discharge: 2014-12-21 | DRG: 308 | Disposition: A | Discharge status: Skilled Nursing Facility | Payer: Medicare Other | Attending: Internal Medicine | Admitting: Internal Medicine

## 2014-12-11 DIAGNOSIS — I25810 Atherosclerosis of coronary artery bypass graft(s) without angina pectoris: Secondary | ICD-10-CM

## 2014-12-11 DIAGNOSIS — Z7902 Long term (current) use of antithrombotics/antiplatelets: Secondary | ICD-10-CM | POA: Diagnosis not present

## 2014-12-11 DIAGNOSIS — N2581 Secondary hyperparathyroidism of renal origin: Secondary | ICD-10-CM | POA: Diagnosis present

## 2014-12-11 DIAGNOSIS — I071 Rheumatic tricuspid insufficiency: Secondary | ICD-10-CM | POA: Insufficient documentation

## 2014-12-11 DIAGNOSIS — M19042 Primary osteoarthritis, left hand: Secondary | ICD-10-CM | POA: Diagnosis not present

## 2014-12-11 DIAGNOSIS — I10 Essential (primary) hypertension: Secondary | ICD-10-CM | POA: Diagnosis not present

## 2014-12-11 DIAGNOSIS — I517 Cardiomegaly: Secondary | ICD-10-CM | POA: Diagnosis not present

## 2014-12-11 DIAGNOSIS — R8290 Unspecified abnormal findings in urine: Secondary | ICD-10-CM | POA: Insufficient documentation

## 2014-12-11 DIAGNOSIS — I472 Ventricular tachycardia, unspecified: Secondary | ICD-10-CM | POA: Diagnosis present

## 2014-12-11 DIAGNOSIS — N189 Chronic kidney disease, unspecified: Secondary | ICD-10-CM | POA: Diagnosis not present

## 2014-12-11 DIAGNOSIS — R4189 Other symptoms and signs involving cognitive functions and awareness: Secondary | ICD-10-CM | POA: Diagnosis present

## 2014-12-11 DIAGNOSIS — I252 Old myocardial infarction: Secondary | ICD-10-CM

## 2014-12-11 DIAGNOSIS — R55 Syncope and collapse: Secondary | ICD-10-CM

## 2014-12-11 DIAGNOSIS — J810 Acute pulmonary edema: Secondary | ICD-10-CM | POA: Diagnosis not present

## 2014-12-11 DIAGNOSIS — I4729 Other ventricular tachycardia: Secondary | ICD-10-CM

## 2014-12-11 DIAGNOSIS — I509 Heart failure, unspecified: Secondary | ICD-10-CM | POA: Diagnosis not present

## 2014-12-11 DIAGNOSIS — R404 Transient alteration of awareness: Secondary | ICD-10-CM | POA: Diagnosis present

## 2014-12-11 DIAGNOSIS — E1122 Type 2 diabetes mellitus with diabetic chronic kidney disease: Secondary | ICD-10-CM | POA: Diagnosis present

## 2014-12-11 DIAGNOSIS — Z8674 Personal history of sudden cardiac arrest: Secondary | ICD-10-CM | POA: Diagnosis not present

## 2014-12-11 DIAGNOSIS — F22 Delusional disorders: Secondary | ICD-10-CM | POA: Diagnosis present

## 2014-12-11 DIAGNOSIS — Z992 Dependence on renal dialysis: Secondary | ICD-10-CM

## 2014-12-11 DIAGNOSIS — R000 Tachycardia, unspecified: Secondary | ICD-10-CM | POA: Diagnosis not present

## 2014-12-11 DIAGNOSIS — Z66 Do not resuscitate: Secondary | ICD-10-CM | POA: Diagnosis present

## 2014-12-11 DIAGNOSIS — E119 Type 2 diabetes mellitus without complications: Secondary | ICD-10-CM | POA: Diagnosis not present

## 2014-12-11 DIAGNOSIS — D259 Leiomyoma of uterus, unspecified: Secondary | ICD-10-CM | POA: Diagnosis present

## 2014-12-11 DIAGNOSIS — D62 Acute posthemorrhagic anemia: Secondary | ICD-10-CM | POA: Diagnosis present

## 2014-12-11 DIAGNOSIS — N186 End stage renal disease: Secondary | ICD-10-CM | POA: Insufficient documentation

## 2014-12-11 DIAGNOSIS — D631 Anemia in chronic kidney disease: Secondary | ICD-10-CM | POA: Diagnosis present

## 2014-12-11 DIAGNOSIS — E861 Hypovolemia: Secondary | ICD-10-CM | POA: Diagnosis present

## 2014-12-11 DIAGNOSIS — I5022 Chronic systolic (congestive) heart failure: Secondary | ICD-10-CM

## 2014-12-11 DIAGNOSIS — I5042 Chronic combined systolic (congestive) and diastolic (congestive) heart failure: Secondary | ICD-10-CM | POA: Diagnosis present

## 2014-12-11 DIAGNOSIS — F29 Unspecified psychosis not due to a substance or known physiological condition: Secondary | ICD-10-CM | POA: Diagnosis not present

## 2014-12-11 DIAGNOSIS — H9190 Unspecified hearing loss, unspecified ear: Secondary | ICD-10-CM

## 2014-12-11 DIAGNOSIS — J9600 Acute respiratory failure, unspecified whether with hypoxia or hypercapnia: Secondary | ICD-10-CM | POA: Diagnosis not present

## 2014-12-11 DIAGNOSIS — A5009 Other early congenital syphilis, symptomatic: Secondary | ICD-10-CM | POA: Diagnosis present

## 2014-12-11 DIAGNOSIS — J811 Chronic pulmonary edema: Secondary | ICD-10-CM | POA: Diagnosis not present

## 2014-12-11 DIAGNOSIS — F489 Nonpsychotic mental disorder, unspecified: Secondary | ICD-10-CM | POA: Diagnosis present

## 2014-12-11 DIAGNOSIS — J9601 Acute respiratory failure with hypoxia: Secondary | ICD-10-CM | POA: Diagnosis present

## 2014-12-11 DIAGNOSIS — Z7982 Long term (current) use of aspirin: Secondary | ICD-10-CM | POA: Diagnosis not present

## 2014-12-11 DIAGNOSIS — F419 Anxiety disorder, unspecified: Secondary | ICD-10-CM | POA: Diagnosis not present

## 2014-12-11 DIAGNOSIS — I120 Hypertensive chronic kidney disease with stage 5 chronic kidney disease or end stage renal disease: Secondary | ICD-10-CM | POA: Diagnosis present

## 2014-12-11 DIAGNOSIS — E669 Obesity, unspecified: Secondary | ICD-10-CM | POA: Diagnosis present

## 2014-12-11 DIAGNOSIS — Z7382 Dual sensory impairment: Secondary | ICD-10-CM | POA: Diagnosis not present

## 2014-12-11 DIAGNOSIS — I255 Ischemic cardiomyopathy: Secondary | ICD-10-CM | POA: Diagnosis not present

## 2014-12-11 DIAGNOSIS — I272 Other secondary pulmonary hypertension: Secondary | ICD-10-CM | POA: Diagnosis present

## 2014-12-11 DIAGNOSIS — R0602 Shortness of breath: Secondary | ICD-10-CM

## 2014-12-11 DIAGNOSIS — Z515 Encounter for palliative care: Secondary | ICD-10-CM

## 2014-12-11 DIAGNOSIS — I5032 Chronic diastolic (congestive) heart failure: Secondary | ICD-10-CM | POA: Diagnosis not present

## 2014-12-11 DIAGNOSIS — H913 Deaf nonspeaking, not elsewhere classified: Secondary | ICD-10-CM | POA: Diagnosis present

## 2014-12-11 DIAGNOSIS — I369 Nonrheumatic tricuspid valve disorder, unspecified: Secondary | ICD-10-CM | POA: Diagnosis not present

## 2014-12-11 DIAGNOSIS — N390 Urinary tract infection, site not specified: Secondary | ICD-10-CM | POA: Diagnosis present

## 2014-12-11 DIAGNOSIS — I270 Primary pulmonary hypertension: Secondary | ICD-10-CM | POA: Diagnosis not present

## 2014-12-11 DIAGNOSIS — E041 Nontoxic single thyroid nodule: Secondary | ICD-10-CM | POA: Diagnosis present

## 2014-12-11 DIAGNOSIS — R918 Other nonspecific abnormal finding of lung field: Secondary | ICD-10-CM | POA: Diagnosis not present

## 2014-12-11 DIAGNOSIS — E785 Hyperlipidemia, unspecified: Secondary | ICD-10-CM | POA: Diagnosis present

## 2014-12-11 DIAGNOSIS — Z6830 Body mass index (BMI) 30.0-30.9, adult: Secondary | ICD-10-CM | POA: Diagnosis not present

## 2014-12-11 DIAGNOSIS — N921 Excessive and frequent menstruation with irregular cycle: Secondary | ICD-10-CM | POA: Diagnosis present

## 2014-12-11 DIAGNOSIS — H540 Blindness, both eyes: Secondary | ICD-10-CM | POA: Diagnosis present

## 2014-12-11 DIAGNOSIS — I469 Cardiac arrest, cause unspecified: Secondary | ICD-10-CM | POA: Diagnosis present

## 2014-12-11 DIAGNOSIS — E871 Hypo-osmolality and hyponatremia: Secondary | ICD-10-CM | POA: Diagnosis present

## 2014-12-11 DIAGNOSIS — Z7189 Other specified counseling: Secondary | ICD-10-CM

## 2014-12-11 DIAGNOSIS — R4182 Altered mental status, unspecified: Secondary | ICD-10-CM | POA: Diagnosis not present

## 2014-12-11 DIAGNOSIS — E1322 Other specified diabetes mellitus with diabetic chronic kidney disease: Secondary | ICD-10-CM | POA: Diagnosis not present

## 2014-12-11 DIAGNOSIS — I110 Hypertensive heart disease with heart failure: Secondary | ICD-10-CM | POA: Diagnosis present

## 2014-12-11 DIAGNOSIS — D638 Anemia in other chronic diseases classified elsewhere: Secondary | ICD-10-CM | POA: Insufficient documentation

## 2014-12-11 DIAGNOSIS — I2510 Atherosclerotic heart disease of native coronary artery without angina pectoris: Secondary | ICD-10-CM | POA: Diagnosis present

## 2014-12-11 DIAGNOSIS — H547 Unspecified visual loss: Secondary | ICD-10-CM

## 2014-12-11 DIAGNOSIS — F4320 Adjustment disorder, unspecified: Secondary | ICD-10-CM | POA: Diagnosis not present

## 2014-12-11 DIAGNOSIS — S199XXA Unspecified injury of neck, initial encounter: Secondary | ICD-10-CM | POA: Diagnosis not present

## 2014-12-11 DIAGNOSIS — H9480 Other specified disorders of ear in diseases classified elsewhere, unspecified ear: Secondary | ICD-10-CM | POA: Diagnosis present

## 2014-12-11 DIAGNOSIS — S0990XA Unspecified injury of head, initial encounter: Secondary | ICD-10-CM | POA: Diagnosis not present

## 2014-12-11 HISTORY — DX: Acute myocardial infarction, unspecified: I21.9

## 2014-12-11 HISTORY — DX: Atherosclerotic heart disease of native coronary artery without angina pectoris: I25.10

## 2014-12-11 LAB — I-STAT CHEM 8, ED
BUN: 25 mg/dL — ABNORMAL HIGH (ref 6–23)
Calcium, Ion: 1.01 mmol/L — ABNORMAL LOW (ref 1.12–1.23)
Chloride: 92 mEq/L — ABNORMAL LOW (ref 96–112)
Creatinine, Ser: 3 mg/dL — ABNORMAL HIGH (ref 0.50–1.10)
Glucose, Bld: 287 mg/dL — ABNORMAL HIGH (ref 70–99)
HCT: 36 % (ref 36.0–46.0)
Hemoglobin: 12.2 g/dL (ref 12.0–15.0)
Potassium: 3.7 mEq/L (ref 3.7–5.3)
Sodium: 135 mEq/L — ABNORMAL LOW (ref 137–147)
TCO2: 26 mmol/L (ref 0–100)

## 2014-12-11 LAB — I-STAT TROPONIN, ED: Troponin i, poc: 0.12 ng/mL (ref 0.00–0.08)

## 2014-12-11 LAB — I-STAT CG4 LACTIC ACID, ED: Lactic Acid, Venous: 2.64 mmol/L — ABNORMAL HIGH (ref 0.5–2.2)

## 2014-12-11 MED ORDER — SODIUM CHLORIDE 0.9 % IV SOLN
1.0000 g | Freq: Once | INTRAVENOUS | Status: AC
  Administered 2014-12-12: 1 g via INTRAVENOUS
  Filled 2014-12-11: qty 10

## 2014-12-11 MED ORDER — SODIUM CHLORIDE 0.9 % IV SOLN: 1.0000 g | Freq: Once | INTRAVENOUS | Status: DC

## 2014-12-11 MED ORDER — SODIUM CHLORIDE 0.9 % IV BOLUS (SEPSIS)
1000.0000 mL | Freq: Once | INTRAVENOUS | Status: AC
  Administered 2014-12-11: 1000 mL via INTRAVENOUS

## 2014-12-11 NOTE — ED Notes (Signed)
The pts son is at the bedside and the pt apparently recognizes the sons touch.  Her fistula is in her  Lt upper arm.

## 2014-12-11 NOTE — ED Notes (Signed)
The pt [facility].  Her husband is her room mate there.  The pt is deaf and blind.  Dialysis pt that was dialyzed earlier today.  The pt was supposedly unresponsive and cpr was iniated by the nursing home staff  For 5 minutes the pt came back and is her normal self at present.  Iv io lt tibia.   By ems

## 2014-12-11 NOTE — ED Notes (Signed)
The  Pts son reports that she is c/o chest pain and a headache.  He is signing with her

## 2014-12-11 NOTE — ED Provider Notes (Signed)
CSN: [id number]     Arrival date & time 12/11/14  2320 History  This chart was scribed for Everlene Balls, MD by Marlowe Kays, ED Scribe. This patient was seen in room [facility] and the patient's care was started at 11:22 PM.  Chief Complaint  Patient presents with  . ams    The history is provided by the nursing home and the EMS personnel. No language interpreter was used.    LEVEL 5 CAVEAT- Full history could not be obtained due to being unresponsive.  HPI Comments:  [name] is a 58 y.o. 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 Pt experienced cardiac arrest that occurred PTA. EMS states pt lives in a nursing home in the same room as her husband. He heard her fall and called the nursing staff. It is reported that she had no pulse and approximately 6 minutes of CPR was administered with no shock advised throughout. Approximately two minutes later pt opened eyes and was alert. She had dialysis today. EMS administered Lidocaine 50 mg in the IO tube in LLE. PMH of HLD, HTN, psychiatric disorder, anemia and heart murmur. Family member explains that pt was admitted about three weeks ago (Thanksgiving night) and had a "mild heart attack".  Past Medical History  Diagnosis Date  . Hyperlipidemia   . Hypertension     a. Has previously refused blood pressure meds.   . Deaf     Divorced from husband but lives with him. Has daughter but she does not care for her.  . Bilateral leg edema     a. Chronic.  Marland Kitchen Psychiatric disorder     She frequently exhibits paranoia and has been diagnosed with psychotic d/o NOS during hospital stay in the past. She is tangetial and perseverative during her visits. She apparently has had a bad experience with mental health in [place] in the past and refuses to discuss mental issues for fear that she will be sent back there. Her paranoia, communication issues, financial woes and lack of  fam  . Fibroid uterus   . Anemia     Due to fibroids.  . Heart murmur   . Diabetes mellitus     a. Per PCP note 2012 (A1C 9.2) - pt unwilling to take meds and was educated on risk of uncontrolled DM.  b. A1C 5.9 in 11/2013.  Marland Kitchen Renal disorder   . Coronary artery disease   . MI (myocardial infarction)    Past Surgical History  Procedure Laterality Date  . No past surgeries    . Insertion of dialysis catheter Right 01/02/2014    Procedure: INSERTION OF DIALYSIS CATHETER;  Surgeon: Rosetta Posner, MD;  Location: [place];  Service: Vascular;  Laterality: Right;  . Av fistula placement Left 01/02/2014    Procedure: ARTERIOVENOUS (AV) FISTULA CREATION;  Surgeon: Rosetta Posner, MD;  Location: [facility] OR;  Service: Vascular;  Laterality: Left;   Family History  Problem Relation Age of Onset  . Other Father     Drowned from fishing?  . Heart disease     History  Substance Use Topics  . Smoking status: Never Smoker   . Smokeless tobacco: Not on file     Comment: Prior dip  . Alcohol Use: No   OB History    Gravida Para Term Preterm AB TAB SAB Ectopic Multiple Living   1 1  1     Review of Systems  Unable to perform ROS  LEVEL 5 CAVEAT- Full history could not be obtained due to being unresponsive.   Allergies  Review of patient's allergies indicates no known allergies.  Home Medications   Prior to Admission medications   Medication Sig Start Date End Date Taking? Authorizing Provider  aspirin 81 MG tablet Take 81 mg by mouth daily. For hypertension    Historical Provider, MD  atorvastatin (LIPITOR) 10 MG tablet Take 10 mg by mouth daily.    Historical Provider, MD  bisacodyl (DULCOLAX) 10 MG suppository Place 1 suppository (10 mg total) rectally daily as needed for mild constipation, moderate constipation or severe constipation. 01/08/14   Kelvin Cellar, MD  calcium acetate (PHOSLO) 667 MG capsule Take 1 capsule (667 mg total) by mouth 3 (three) times daily with meals. 01/08/14    Kelvin Cellar, MD  cloNIDine (CATAPRES - DOSED IN MG/24 HR) 0.1 mg/24hr patch Place 1 patch (0.1 mg total) onto the skin once a week. For this week, then off. 11/26/14   Ripudeep K Rai, MD  clopidogrel (PLAVIX) 75 MG tablet Take 1 tablet (75 mg total) by mouth daily. 11/26/14   Ripudeep Krystal Eaton, MD  darbepoetin (ARANESP) 100 MCG/0.5ML SOLN injection Inject 0.5 mLs (100 mcg total) into the skin every Tuesday at 6 PM. 01/08/14   Kelvin Cellar, MD  Docusate Sodium (DSS) 100 MG CAPS Take 100 mg by mouth 2 (two) times daily. For constipation 01/08/14   Kelvin Cellar, MD  hydrALAZINE (APRESOLINE) 100 MG tablet Take 1 tablet (100 mg total) by mouth every 8 (eight) hours. 01/08/14   Kelvin Cellar, MD  isosorbide mononitrate (IMDUR) 60 MG 24 hr tablet Take 1 tablet (60 mg total) by mouth daily. 11/26/14   Ripudeep Krystal Eaton, MD  lactulose (CHRONULAC) 10 GM/15ML solution Take 30 mLs (20 g total) by mouth 2 (two) times daily as needed for moderate constipation. 01/08/14   Kelvin Cellar, MD  levalbuterol (XOPENEX) 0.63 MG/3ML nebulizer solution Take 3 mLs (0.63 mg total) by nebulization every 2 (two) hours as needed for wheezing or shortness of breath. 01/08/14   Kelvin Cellar, MD  lisinopril (PRINIVIL,ZESTRIL) 2.5 MG tablet Take 1 tablet (2.5 mg total) by mouth daily. 11/26/14   Ripudeep Krystal Eaton, MD  metoprolol succinate (TOPROL-XL) 50 MG 24 hr tablet Take 1 tablet (50 mg total) by mouth daily. Take with or immediately following a meal. 11/26/14   Ripudeep K Rai, MD  multivitamin (RENA-VIT) TABS tablet Take 1 tablet by mouth at bedtime. 01/08/14   Kelvin Cellar, MD  naphazoline-pheniramine (NAPHCON-A) 0.025-0.3 % ophthalmic solution Place 1 drop into both eyes 4 (four) times daily as needed for irritation. 01/08/14   Kelvin Cellar, MD  nitroGLYCERIN (NITROSTAT) 0.4 MG SL tablet Place 1 tablet (0.4 mg total) under the tongue every 5 (five) minutes as needed for chest pain. 11/26/14   Ripudeep Krystal Eaton, MD  promethazine  (PHENERGAN) 12.5 MG suppository Place 12.5 mg rectally every 4 (four) hours as needed for nausea or vomiting.    Historical Provider, MD  risperiDONE microspheres (RISPERDAL CONSTA) 25 MG injection Inject 25 mg into the muscle every 14 (fourteen) days. For psychosis    Historical Provider, MD   Triage Vitals: BP 120/77 mmHg  Pulse 144  Resp 18  SpO2 98% Physical Exam  Constitutional: She is oriented to person, place, and time. She appears well-developed and well-nourished. No distress.  Blind and deaf.  HENT:  Head:  Normocephalic and atraumatic.  Eyes: Conjunctivae and EOM are normal. Pupils are equal, round, and reactive to light. No scleral icterus.  Neck: Normal range of motion. Neck supple. No JVD present. No tracheal deviation present. No thyromegaly present.  Cardiovascular: Regular rhythm and normal heart sounds.  Tachycardia present.  Exam reveals no gallop and no friction rub.   No murmur heard. Pulmonary/Chest: Effort normal and breath sounds normal. No respiratory distress. She has no wheezes. She exhibits no tenderness.  Abdominal: Soft. Bowel sounds are normal. She exhibits no distension and no mass. There is no tenderness. There is no rebound and no guarding.  Musculoskeletal: Normal range of motion. She exhibits no edema or tenderness.  LUE AV fistula. IO in LLE.  Lymphadenopathy:    She has no cervical adenopathy.  Neurological: She is alert and oriented to person, place, and time.  Decreased sensation to right upper and lower extremity.  Skin: Skin is warm and dry. No rash noted. No erythema. No pallor.  Nursing note and vitals reviewed.   ED Course  Procedures (including critical care time) DIAGNOSTIC STUDIES: Oxygen Saturation is 98% on RA, normal by my interpretation.   COORDINATION OF CARE: 11:30 PM- Will order CXR, lab work, and EKG. Pt verbalizes understanding and agrees to plan.  Medications  calcium gluconate 1 g in sodium chloride 0.9 % 100 mL IVPB (not  administered)  sodium chloride 0.9 % bolus 1,000 mL (1,000 mLs Intravenous New Bag/Given 12/11/14 2336)   Labs Review Labs Reviewed  CBC WITH DIFFERENTIAL - Abnormal; Notable for the following:    RBC 3.06 (*)    Hemoglobin 9.6 (*)    HCT 30.0 (*)    All other components within normal limits  I-STAT CG4 LACTIC ACID, ED - Abnormal; Notable for the following:    Lactic Acid, Venous 2.64 (*)    All other components within normal limits  I-STAT CHEM 8, ED - Abnormal; Notable for the following:    Sodium 135 (*)    Chloride 92 (*)    BUN 25 (*)    Creatinine, Ser 3.00 (*)    Glucose, Bld 287 (*)    Calcium, Ion 1.01 (*)    All other components within normal limits  I-STAT TROPOININ, ED - Abnormal; Notable for the following:    Troponin i, poc 0.12 (*)    All other components within normal limits  CULTURE, BLOOD (ROUTINE X 2)  CULTURE, BLOOD (ROUTINE X 2)  URINE CULTURE  COMPREHENSIVE METABOLIC PANEL  LIPASE, BLOOD  URINALYSIS, ROUTINE W REFLEX MICROSCOPIC  MAGNESIUM    Imaging Review Ct Head Wo Contrast  12/12/2014   CLINICAL DATA:  Acute onset of altered mental status. Status post last fall with asystole, status post CPR and resuscitation. Initial encounter. Concern for head or cervical spine injury.  EXAM: CT HEAD WITHOUT CONTRAST  CT CERVICAL SPINE WITHOUT CONTRAST  TECHNIQUE: Multidetector CT imaging of the head and cervical spine was performed following the standard protocol without intravenous contrast. Multiplanar CT image reconstructions of the cervical spine were also generated.  COMPARISON:  None.  FINDINGS: CT HEAD FINDINGS  There is no evidence of acute infarction, mass lesion, or intra- or extra-axial hemorrhage on CT.  Prominence of the ventricles and sulci reflects mild cortical volume loss. Scattered periventricular and subcortical white matter change likely reflects small vessel ischemic microangiopathy.  The brainstem and fourth ventricle are within normal limits. The  basal ganglia are unremarkable in appearance. The cerebral hemispheres demonstrate grossly  normal gray-white differentiation. No mass effect or midline shift is seen.  There is no evidence of fracture; visualized osseous structures are unremarkable in appearance. The visualized portions of the orbits are within normal limits. The paranasal sinuses and mastoid air cells are well-aerated. No significant soft tissue abnormalities are seen.  CT CERVICAL SPINE FINDINGS  There is no evidence of fracture or subluxation. Vertebral bodies demonstrate normal height and alignment. There is mild narrowing of the intervertebral disc spaces at C3-C4 and C4-C5, with associated anterior and posterior disc osteophyte complexes. Prevertebral soft tissues are within normal limits.  A 0.9 cm hypodensity is noted at the left thyroid lobe. There is mild interstitial prominence at the lung apices. No significant soft tissue abnormalities are seen.  IMPRESSION: 1. No evidence of traumatic intracranial injury or fracture. 2. No evidence of fracture or subluxation along the cervical spine. 3. Mild cortical volume loss and scattered small vessel ischemic microangiopathy. 4. Minimal degenerative change at the upper cervical spine. 5. Sub-centimeter thyroid nodule(s) noted, too small to characterize, but most likely benign in the absence of known clinical risk factors for thyroid carcinoma. 6. Mild interstitial prominence at the lung apices.   Electronically Signed   By: Garald Balding M.D.   On: 12/12/2014 00:02   Ct Cervical Spine Wo Contrast  12/12/2014   CLINICAL DATA:  Acute onset of altered mental status. Status post last fall with asystole, status post CPR and resuscitation. Initial encounter. Concern for head or cervical spine injury.  EXAM: CT HEAD WITHOUT CONTRAST  CT CERVICAL SPINE WITHOUT CONTRAST  TECHNIQUE: Multidetector CT imaging of the head and cervical spine was performed following the standard protocol without intravenous  contrast. Multiplanar CT image reconstructions of the cervical spine were also generated.  COMPARISON:  None.  FINDINGS: CT HEAD FINDINGS  There is no evidence of acute infarction, mass lesion, or intra- or extra-axial hemorrhage on CT.  Prominence of the ventricles and sulci reflects mild cortical volume loss. Scattered periventricular and subcortical white matter change likely reflects small vessel ischemic microangiopathy.  The brainstem and fourth ventricle are within normal limits. The basal ganglia are unremarkable in appearance. The cerebral hemispheres demonstrate grossly normal gray-white differentiation. No mass effect or midline shift is seen.  There is no evidence of fracture; visualized osseous structures are unremarkable in appearance. The visualized portions of the orbits are within normal limits. The paranasal sinuses and mastoid air cells are well-aerated. No significant soft tissue abnormalities are seen.  CT CERVICAL SPINE FINDINGS  There is no evidence of fracture or subluxation. Vertebral bodies demonstrate normal height and alignment. There is mild narrowing of the intervertebral disc spaces at C3-C4 and C4-C5, with associated anterior and posterior disc osteophyte complexes. Prevertebral soft tissues are within normal limits.  A 0.9 cm hypodensity is noted at the left thyroid lobe. There is mild interstitial prominence at the lung apices. No significant soft tissue abnormalities are seen.  IMPRESSION: 1. No evidence of traumatic intracranial injury or fracture. 2. No evidence of fracture or subluxation along the cervical spine. 3. Mild cortical volume loss and scattered small vessel ischemic microangiopathy. 4. Minimal degenerative change at the upper cervical spine. 5. Sub-centimeter thyroid nodule(s) noted, too small to characterize, but most likely benign in the absence of known clinical risk factors for thyroid carcinoma. 6. Mild interstitial prominence at the lung apices.   Electronically  Signed   By: Garald Balding M.D.   On: 12/12/2014 00:02   Dg Chest Port 1  View  12/12/2014   CLINICAL DATA:  Status post cardiac arrest and resuscitation. Initial encounter.  EXAM: PORTABLE CHEST - 1 VIEW  COMPARISON:  Chest radiograph from 01/02/2014  FINDINGS: There is elevation of the right hemidiaphragm. There is no evidence of focal opacification, pleural effusion or pneumothorax.  The cardiomediastinal silhouette is borderline normal in size. No acute osseous abnormalities are seen. A small left cervical rib is incidentally noted.  IMPRESSION: 1. Elevation of the right hemidiaphragm; lungs remain grossly clear. 2. Small left cervical rib incidentally noted.   Electronically Signed   By: Garald Balding M.D.   On: 12/12/2014 00:04     EKG Interpretation   Date/Time:  Friday December 11 2014 [id number] EST Ventricular Rate:  132 PR Interval:    QRS Duration: 95 QT Interval:  325 QTC Calculation: 482 R Axis:   25 Text Interpretation:  Normal sinus rhythm Inferior infarct, old Anterior  infarct, old ST elevation in V2-V4 Confirmed by Glynn Octave  [phone]) on 12/11/2014 11:41:16 PM      MDM   Final diagnoses:  Cardiac arrest    Patient presents emergency department after a reported cardiac arrest at nursing facility. Upon arrival to the emergency department she was awake and alert. History cannot be obtained due to deafness and blindness. EKG reveals atrial fibrillation rate in the 150s. Patient was given 20 mg of diltiazem, this came down to 120. She has no history of this thus in combination with possible syncopal episode pulmonary embolisms possibility. CT angios currently pending. 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  CRITICAL CARE Performed by:  Everlene Balls   Total critical care time: 63min  Critical care time was exclusive of separately billable procedures and treating other patients.  Critical care was necessary to treat or prevent imminent or life-threatening deterioration.  Critical care was time spent personally by me on the following activities: development of treatment plan with patient and/or surrogate as well as nursing, discussions with consultants, evaluation of patient's response to treatment, examination of patient, obtaining history from patient or surrogate, ordering and performing treatments and interventions, ordering and review of laboratory studies, ordering and review of radiographic studies, pulse oximetry and re-evaluation of patient's condition.   I personally performed the services described in this documentation, which was scribed in my presence. The recorded information has been reviewed and is accurate.    Everlene Balls, MD 12/12/14 1357

## 2014-12-11 NOTE — ED Notes (Signed)
To ct and back

## 2014-12-11 NOTE — Consult Note (Addendum)
CARDIOLOGY CONSULT NOTE   Patient ID: [name] MRN: [id number], DOB/AGE: May 22, 1956   Admit date: 12/11/2014 Date of Consult: 12/11/2014   Primary Physician: No primary care provider on file. Primary Cardiologist: None  Pt. Profile  Ms. [name] is a 58 yo woman with PMH of deafness, blindness, hypertension, dyslipidemia, T2DM, ESRD on hemodialysis Monday/Wednesday/Friday and recent large missed anterior MI (diagnosed 11/20/14) who lives at a nursing home and presents with possible cardiac arrest.  Problem List  Past Medical History  Diagnosis Date  . Hyperlipidemia   . Hypertension     a. Has previously refused blood pressure meds.   . Deaf     Divorced from husband but lives with him. Has daughter but she does not care for her.  . Bilateral leg edema     a. Chronic.  Marland Kitchen Psychiatric disorder     She frequently exhibits paranoia and has been diagnosed with psychotic d/o NOS during hospital stay in the past. She is tangetial and perseverative during her visits. She apparently has had a bad experience with mental health in [place] in the past and refuses to discuss mental issues for fear that she will be sent back there. Her paranoia, communication issues, financial woes and lack of fam  . Fibroid uterus   . Anemia     Due to fibroids.  . Heart murmur   . Diabetes mellitus     a. Per PCP note 2012 (A1C 9.2) - pt unwilling to take meds and was educated on risk of uncontrolled DM.  b. A1C 5.9 in 11/2013.    Past Surgical History  Procedure Laterality Date  . No past surgeries    . Insertion of dialysis catheter Right 01/02/2014    Procedure: INSERTION OF DIALYSIS CATHETER;  Surgeon: Rosetta Posner, MD;  Location: [facility];  Service: Vascular;  Laterality: Right;  . Av fistula placement Left 01/02/2014    Procedure: ARTERIOVENOUS (AV) FISTULA CREATION;  Surgeon: Rosetta Posner, MD;  Location: [facility];  Service: Vascular;  Laterality: Left;     Allergies  No Known Allergies  HPI    Ms. [name] is a 58 yo woman with PMH of deafness, blindness, hypertension, dyslipidemia, T2DM, ESRD on hemodialysis Monday/Wednesday/Friday and recent large missed anterior MI (diagnosed 11/20/14) who lives at a nursing home and presents with possible cardiac arrest.  Ms. [name] lives at the nursing home and her husband who lives there as well apparently heard a thud and found the patient unconscious.  She was reportedly without a pulse. CPR was initiated and AED was placed and no shock was advised. After 6 minutes of chest compressions and no meds (except lidocaine which was administered via an IO), she awoke up and returned to her baseline. Prior to this her son states she was in her Panacea.   In the ED she was hemodynamically stable. ECG demonstrated sinus tach with diffuse anterior Q waves with anterior STE (most prominent in V3) with more subtle STE in the interior leads. Compared to priors, the STE in V3 was more pronounced but the lateral STE were less pronounced. She was initially in NSR but did go into rapid AF and a diltiazem drip was started. Labs were notable for a K 3.7, Cr 2.9, TnI 0.12, lactate of 2.64, hct 30. CT angiogram demonstrated no PE. On my exam she was at her baseline mental status (per son) where she can communicate with her son when he moves her hand to  make sign language signs. Due to her blindness she cannot see signs at all.   Of note, Ms. [name] was admitted to [facility] in late Nov for a large missed anterior MI. TTE demonstrated EF 30-35% with dyskinesis and aneurysmal deformity of the mid-apicalanteroseptal, anterior, inferior, inferoseptal, and apical myocardium; consistent with infarction in the distribution of the left anterior descending coronary artery. Extensive infarct (without ischemia) was confirmed with a nuclear stress test.   Inpatient Medications     Family History Family History  Problem Relation Age of Onset  . Other Father     Drowned from fishing?  .  Heart disease       Social History History   Social History  . Marital Status: Married    Spouse Name: N/A    Number of Children: N/A  . Years of Education: N/A   Occupational History  . Not on file.   Social History Main Topics  . Smoking status: Never Smoker   . Smokeless tobacco: Not on file     Comment: Prior dip  . Alcohol Use: No  . Drug Use: No  . Sexual Activity: Not on file   Other Topics Concern  . Not on file   Social History Narrative     Review of Systems  Unable to perform  Physical Exam  Blood pressure 120/77, pulse 144, resp. rate 18, SpO2 98 %.  General: Frail, NAD Psych: Noncommunicative Neuro: Noncommunicative Moves all extremities spontaneously. HEENT: Normal  Neck: Supple without bruits or JVD. Lungs:  Resp regular and unlabored, CTA. Heart: Tachy, irregular. Diffuse systolic murmur across the chest due to AVF. Appeared to have chest wall tenderness.  Abdomen: Soft, non-tender, non-distended, BS + x 4.  Extremities: No clubbing, cyanosis or edema. DP/PT/ 2+ and equal bilaterally. LRA diminished in setting of LUE AVF.   Labs  No results for input(s): CKTOTAL, CKMB, TROPONINI in the last 72 hours. Lab Results  Component Value Date   WBC 7.6 11/26/2014   HGB 8.2* 11/26/2014   HCT 26.6* 11/26/2014   MCV 97.8 11/26/2014   PLT 224 11/26/2014   No results for input(s): NA, K, CL, CO2, BUN, CREATININE, CALCIUM, PROT, BILITOT, ALKPHOS, ALT, AST, GLUCOSE in the last 168 hours.  Invalid input(s): LABALBU Lab Results  Component Value Date   CHOL 237* 02/09/2010   HDL 51 02/09/2010   LDLCALC 170* 02/09/2010   TRIG 78 02/09/2010   Lab Results  Component Value Date   DDIMER  01/08/2010    <0.22        AT THE INHOUSE ESTABLISHED CUTOFF VALUE OF 0.48 ug/mL FEU, THIS ASSAY HAS BEEN DOCUMENTED IN THE LITERATURE TO HAVE A SENSITIVITY AND NEGATIVE PREDICTIVE VALUE OF AT LEAST 98 TO 99%.  THE TEST RESULT SHOULD BE CORRELATED WITH AN  ASSESSMENT OF THE CLINICAL PROBABILITY OF DVT / VTE.    Radiology/Studies  Dg Chest 2 View  11/20/2014   CLINICAL DATA:  Respiratory distress and chest pain.  EXAM: CHEST  2 VIEW  COMPARISON:  01/02/2014.  FINDINGS: Mild cardiac enlargement. Small bilateral pleural effusions and mild to moderate interstitial edema is identified. No focal airspace consolidation identified. Spondylosis noted within the thoracic spine.  IMPRESSION: 1. Mild to moderate CHF.   Electronically Signed   By: Kerby Moors M.D.   On: 11/20/2014 00:34   Nm Myocar Multi W/spect W/wall Motion / Ef  11/24/2014   CLINICAL DATA:  58 year old with suspected anterior wall myocardial infarction. Medical history is significant  for end-stage renal disease and diabetes.  EXAM: MYOCARDIAL IMAGING WITH SPECT (REST AND PHARMACOLOGIC-STRESS)  GATED LEFT VENTRICULAR WALL MOTION STUDY  LEFT VENTRICULAR EJECTION FRACTION  TECHNIQUE: Standard myocardial SPECT imaging was performed after resting intravenous injection of 10 mCi Tc-68m sestamibi. Subsequently, intravenous infusion of Lexiscan was performed under the supervision of the Cardiology staff. At peak effect of the drug, 30 mCi Tc-69m sestamibi was injected intravenously and standard myocardial SPECT imaging was performed. Quantitative gated imaging was also performed to evaluate left ventricular wall motion, and estimate left ventricular ejection fraction.  COMPARISON:  Chest radiograph 11/20/2014  FINDINGS: Perfusion: Fixed defect involving the apex. There is a fixed defect involving the anterior lateral wall at the apex and mid segment. Fixed defect along the inferior wall in the mid and basilar segments. No evidence for reversibility.  Wall Motion: Diffuse hypokinesia. Minimal wall motion in the lateral wall.  Left Ventricular Ejection Fraction: 35 %  End diastolic volume 123XX123 ml  End systolic volume [id number] ml  IMPRESSION: 1. Fixed defects involving the apex, anterior lateral wall and the  inferior wall. The apex and anterior lateral wall fixed defects are suggestive for an infarct. No evidence for reversibility or ischemia.  2. Diffuse hypokinesia and minimal wall motion along the lateral wall.  3. Left ventricular ejection fraction is 35%.  4. High-risk stress test findings*.  *2012 Appropriate Use Criteria for Coronary Revascularization Focused Update: J Am Coll Cardiol. [id number]. http://content.airportbarriers.com.aspx?articleid=1201161   Electronically Signed   By: Markus Daft M.D.   On: 11/24/2014 17:20    11/20/14 TTE - Left ventricle: The cavity size was normal. There was moderate concentric hypertrophy. Systolic function was moderately to severely reduced. The estimated ejection fraction was in the range of 30% to 35%. Dyskinesis and aneurysmal deformity of the mid-apicalanteroseptal, anterior, inferior, inferoseptal, and apical myocardium; consistent with infarction in the distribution of the left anterior descending coronary artery. Features are consistent with a pseudonormal left ventricular filling pattern, with concomitant abnormal relaxation and increased filling pressure (grade 2 diastolic dysfunction). - Mitral valve: Severely calcified annulus. - Left atrium: The atrium was moderately dilated. - Pulmonary arteries: Systolic pressure was mildly increased. PA peak pressure: 44 mm Hg (S).  Impressions:  - Compared to January 2015 there is a new severe and extensive wall motion abnormality consistent with infarction in the LAD artery distribution. LV systolic function has decreased substantially.  11/24/14 SPECT EXAM: MYOCARDIAL IMAGING WITH SPECT (REST AND PHARMACOLOGIC-STRESS)  GATED LEFT VENTRICULAR WALL MOTION STUDY  LEFT VENTRICULAR EJECTION FRACTION  TECHNIQUE: Standard myocardial SPECT imaging was performed after resting intravenous injection of 10 mCi Tc-33m sestamibi. Subsequently, intravenous infusion of  Lexiscan was performed under the supervision of the Cardiology staff. At peak effect of the drug, 30 mCi Tc-54m sestamibi was injected intravenously and standard myocardial SPECT imaging was performed. Quantitative gated imaging was also performed to evaluate left ventricular wall motion, and estimate left ventricular ejection fraction.  COMPARISON: Chest radiograph 11/20/2014  FINDINGS: Perfusion: Fixed defect involving the apex. There is a fixed defect involving the anterior lateral wall at the apex and mid segment. Fixed defect along the inferior wall in the mid and basilar segments. No evidence for reversibility. Wall Motion: Diffuse hypokinesia. Minimal wall motion in the lateral wall. Left Ventricular Ejection Fraction: 35 % End diastolic volume 123XX123 ml End systolic volume [id number] ml  IMPRESSION: 1. Fixed defects involving the apex, anterior lateral wall and the inferior wall. The apex and anterior lateral wall fixed defects  are suggestive for an infarct. No evidence for reversibility or ischemia. 2. Diffuse hypokinesia and minimal wall motion along the lateral wall. 3. Left ventricular ejection fraction is 35%. 4. High-risk stress test findings*.  ECG  12/11/14 ECG demonstrated sinus tach with diffuse anterior Q waves with anterior STE (most prominent in V3) with more subtle STE in the interior leads. Compared to prior (11/21/14) , the STE in V3 was more pronounced but the lateral STE were less pronounced.   ASSESSMENT AND PLAN  Ms. [name] is a 58 yo woman with PMH of deafness, blindness, hypertension, dyslipidemia, T2DM, ESRD on hemodialysis Monday/Wednesday/Friday and recent large missed anterior MI (diagnosed 11/20/14) who lives at a nursing home and presents with possible cardiac arrest. While Ms. [name] certainly had a LOC, it is difficult to know if she had a true cardiac arrest.  The fact that the AED recommended no shock is strong evidence that there was not VT or  VF. Typically, patients will not have ROSC in the setting of PEA with compressions only; usually vasopressors or some treatment of an underlying cause is required. It is theoretically possible that she had a prolonged bradycardic arrest and she developed ROSC without any chronotropic agents. Another possibility is that she had rapid AF that led to hemodynamic compromise; however, AF typically does not cause PEA unless there is a second process going on (e.g. Sepsis). In sum, at this point, it is not entirely clear if an arrhythmia cause this prolonged LOC. She is a CHADSVASC of 4 (female sex, HTN, DM, HF) and could be considered for systemic anticoagulation although risks may outweigh the benefits given extensive co-morbidities.   1. Telemetry monitoring to look for evidence of an arrhythmic cause for this LOC.  2. Cycle troponins (I would expect them to be positive to some degree) 3. TTE 4. Given her multiple co-morbidities she is not a good candidate for an ICD   Signed, Lamar Sprinkles, MD 12/11/2014, 11:48 PM  Addendum Patient developed acute dyspnea with rhonchi/rales in the setting of hypertension to 200s. Shortly after developed fast VT at 222bpm for ~10 seconds and converted spontaneously. She was put on BiPAP, given IV lasix, IV nitro, and IV amiodarone. She is putting out urine to the IV lasix.   This episode of VT does suggest that her LOC today was in fact a VT arrest and that perhaps there was an issue with AED application and/or function.  I would recommend treatment with IV amiodarone for now. The nursing home should be contacted to ensure that the AED is functioning properly. She may require dialysis today for volume management if she cannot urinate off enough.

## 2014-12-11 NOTE — ED Notes (Signed)
The pts son reports that the pt has been a dialysis pt since feb of this year.  She was dialyzed earlier today

## 2014-12-12 ENCOUNTER — Encounter (HOSPITAL_COMMUNITY): Payer: Self-pay | Admitting: Radiology

## 2014-12-12 ENCOUNTER — Inpatient Hospital Stay (HOSPITAL_COMMUNITY): Payer: Medicare Other

## 2014-12-12 ENCOUNTER — Emergency Department (HOSPITAL_COMMUNITY): Payer: Medicare Other

## 2014-12-12 ENCOUNTER — Encounter (HOSPITAL_COMMUNITY): Payer: Self-pay

## 2014-12-12 DIAGNOSIS — I071 Rheumatic tricuspid insufficiency: Secondary | ICD-10-CM | POA: Diagnosis present

## 2014-12-12 DIAGNOSIS — F419 Anxiety disorder, unspecified: Secondary | ICD-10-CM | POA: Diagnosis not present

## 2014-12-12 DIAGNOSIS — I509 Heart failure, unspecified: Secondary | ICD-10-CM | POA: Diagnosis not present

## 2014-12-12 DIAGNOSIS — F489 Nonpsychotic mental disorder, unspecified: Secondary | ICD-10-CM | POA: Diagnosis not present

## 2014-12-12 DIAGNOSIS — R4189 Other symptoms and signs involving cognitive functions and awareness: Secondary | ICD-10-CM | POA: Diagnosis present

## 2014-12-12 DIAGNOSIS — R55 Syncope and collapse: Secondary | ICD-10-CM | POA: Diagnosis not present

## 2014-12-12 DIAGNOSIS — E861 Hypovolemia: Secondary | ICD-10-CM | POA: Diagnosis present

## 2014-12-12 DIAGNOSIS — N186 End stage renal disease: Secondary | ICD-10-CM | POA: Diagnosis present

## 2014-12-12 DIAGNOSIS — H540 Blindness, both eyes: Secondary | ICD-10-CM | POA: Diagnosis present

## 2014-12-12 DIAGNOSIS — F29 Unspecified psychosis not due to a substance or known physiological condition: Secondary | ICD-10-CM | POA: Diagnosis not present

## 2014-12-12 DIAGNOSIS — D62 Acute posthemorrhagic anemia: Secondary | ICD-10-CM | POA: Diagnosis present

## 2014-12-12 DIAGNOSIS — Z6830 Body mass index (BMI) 30.0-30.9, adult: Secondary | ICD-10-CM | POA: Diagnosis not present

## 2014-12-12 DIAGNOSIS — R8290 Unspecified abnormal findings in urine: Secondary | ICD-10-CM | POA: Diagnosis not present

## 2014-12-12 DIAGNOSIS — F22 Delusional disorders: Secondary | ICD-10-CM | POA: Diagnosis present

## 2014-12-12 DIAGNOSIS — M19042 Primary osteoarthritis, left hand: Secondary | ICD-10-CM | POA: Diagnosis not present

## 2014-12-12 DIAGNOSIS — J810 Acute pulmonary edema: Secondary | ICD-10-CM | POA: Diagnosis not present

## 2014-12-12 DIAGNOSIS — I255 Ischemic cardiomyopathy: Secondary | ICD-10-CM

## 2014-12-12 DIAGNOSIS — I10 Essential (primary) hypertension: Secondary | ICD-10-CM

## 2014-12-12 DIAGNOSIS — I472 Ventricular tachycardia, unspecified: Secondary | ICD-10-CM | POA: Diagnosis present

## 2014-12-12 DIAGNOSIS — Z7902 Long term (current) use of antithrombotics/antiplatelets: Secondary | ICD-10-CM | POA: Diagnosis not present

## 2014-12-12 DIAGNOSIS — I120 Hypertensive chronic kidney disease with stage 5 chronic kidney disease or end stage renal disease: Secondary | ICD-10-CM | POA: Diagnosis present

## 2014-12-12 DIAGNOSIS — A5009 Other early congenital syphilis, symptomatic: Secondary | ICD-10-CM | POA: Diagnosis present

## 2014-12-12 DIAGNOSIS — R404 Transient alteration of awareness: Secondary | ICD-10-CM | POA: Diagnosis present

## 2014-12-12 DIAGNOSIS — Z7982 Long term (current) use of aspirin: Secondary | ICD-10-CM | POA: Diagnosis not present

## 2014-12-12 DIAGNOSIS — Z8674 Personal history of sudden cardiac arrest: Secondary | ICD-10-CM | POA: Diagnosis not present

## 2014-12-12 DIAGNOSIS — H913 Deaf nonspeaking, not elsewhere classified: Secondary | ICD-10-CM | POA: Diagnosis present

## 2014-12-12 DIAGNOSIS — I270 Primary pulmonary hypertension: Secondary | ICD-10-CM | POA: Diagnosis not present

## 2014-12-12 DIAGNOSIS — N390 Urinary tract infection, site not specified: Secondary | ICD-10-CM | POA: Diagnosis present

## 2014-12-12 DIAGNOSIS — J811 Chronic pulmonary edema: Secondary | ICD-10-CM | POA: Diagnosis not present

## 2014-12-12 DIAGNOSIS — N189 Chronic kidney disease, unspecified: Secondary | ICD-10-CM | POA: Diagnosis not present

## 2014-12-12 DIAGNOSIS — I5042 Chronic combined systolic (congestive) and diastolic (congestive) heart failure: Secondary | ICD-10-CM | POA: Diagnosis present

## 2014-12-12 DIAGNOSIS — H9480 Other specified disorders of ear in diseases classified elsewhere, unspecified ear: Secondary | ICD-10-CM | POA: Diagnosis present

## 2014-12-12 DIAGNOSIS — Z66 Do not resuscitate: Secondary | ICD-10-CM | POA: Diagnosis present

## 2014-12-12 DIAGNOSIS — E785 Hyperlipidemia, unspecified: Secondary | ICD-10-CM | POA: Diagnosis present

## 2014-12-12 DIAGNOSIS — R0602 Shortness of breath: Secondary | ICD-10-CM | POA: Diagnosis not present

## 2014-12-12 DIAGNOSIS — Z992 Dependence on renal dialysis: Secondary | ICD-10-CM | POA: Diagnosis not present

## 2014-12-12 DIAGNOSIS — E1322 Other specified diabetes mellitus with diabetic chronic kidney disease: Secondary | ICD-10-CM

## 2014-12-12 DIAGNOSIS — Z515 Encounter for palliative care: Secondary | ICD-10-CM | POA: Diagnosis not present

## 2014-12-12 DIAGNOSIS — I369 Nonrheumatic tricuspid valve disorder, unspecified: Secondary | ICD-10-CM | POA: Diagnosis not present

## 2014-12-12 DIAGNOSIS — J9600 Acute respiratory failure, unspecified whether with hypoxia or hypercapnia: Secondary | ICD-10-CM | POA: Diagnosis not present

## 2014-12-12 DIAGNOSIS — I517 Cardiomegaly: Secondary | ICD-10-CM | POA: Diagnosis not present

## 2014-12-12 DIAGNOSIS — I272 Other secondary pulmonary hypertension: Secondary | ICD-10-CM | POA: Diagnosis present

## 2014-12-12 DIAGNOSIS — E041 Nontoxic single thyroid nodule: Secondary | ICD-10-CM | POA: Diagnosis present

## 2014-12-12 DIAGNOSIS — D638 Anemia in other chronic diseases classified elsewhere: Secondary | ICD-10-CM | POA: Diagnosis not present

## 2014-12-12 DIAGNOSIS — E871 Hypo-osmolality and hyponatremia: Secondary | ICD-10-CM | POA: Diagnosis present

## 2014-12-12 DIAGNOSIS — F4320 Adjustment disorder, unspecified: Secondary | ICD-10-CM | POA: Diagnosis not present

## 2014-12-12 DIAGNOSIS — R000 Tachycardia, unspecified: Secondary | ICD-10-CM | POA: Diagnosis not present

## 2014-12-12 DIAGNOSIS — D259 Leiomyoma of uterus, unspecified: Secondary | ICD-10-CM | POA: Diagnosis present

## 2014-12-12 DIAGNOSIS — D631 Anemia in chronic kidney disease: Secondary | ICD-10-CM | POA: Diagnosis present

## 2014-12-12 DIAGNOSIS — N921 Excessive and frequent menstruation with irregular cycle: Secondary | ICD-10-CM | POA: Diagnosis present

## 2014-12-12 DIAGNOSIS — I469 Cardiac arrest, cause unspecified: Secondary | ICD-10-CM | POA: Diagnosis present

## 2014-12-12 DIAGNOSIS — Z7382 Dual sensory impairment: Secondary | ICD-10-CM | POA: Diagnosis not present

## 2014-12-12 DIAGNOSIS — N2581 Secondary hyperparathyroidism of renal origin: Secondary | ICD-10-CM | POA: Diagnosis present

## 2014-12-12 DIAGNOSIS — J9601 Acute respiratory failure with hypoxia: Secondary | ICD-10-CM | POA: Diagnosis present

## 2014-12-12 DIAGNOSIS — E119 Type 2 diabetes mellitus without complications: Secondary | ICD-10-CM | POA: Diagnosis not present

## 2014-12-12 DIAGNOSIS — I252 Old myocardial infarction: Secondary | ICD-10-CM | POA: Diagnosis not present

## 2014-12-12 DIAGNOSIS — E1122 Type 2 diabetes mellitus with diabetic chronic kidney disease: Secondary | ICD-10-CM | POA: Diagnosis present

## 2014-12-12 DIAGNOSIS — R918 Other nonspecific abnormal finding of lung field: Secondary | ICD-10-CM | POA: Diagnosis not present

## 2014-12-12 DIAGNOSIS — E669 Obesity, unspecified: Secondary | ICD-10-CM | POA: Diagnosis present

## 2014-12-12 DIAGNOSIS — I5032 Chronic diastolic (congestive) heart failure: Secondary | ICD-10-CM

## 2014-12-12 DIAGNOSIS — I2510 Atherosclerotic heart disease of native coronary artery without angina pectoris: Secondary | ICD-10-CM | POA: Diagnosis present

## 2014-12-12 DIAGNOSIS — H9190 Unspecified hearing loss, unspecified ear: Secondary | ICD-10-CM | POA: Diagnosis not present

## 2014-12-12 HISTORY — DX: Cardiac arrest, cause unspecified: I46.9

## 2014-12-12 LAB — COMPREHENSIVE METABOLIC PANEL
ALT: 67 U/L — ABNORMAL HIGH (ref 0–35)
ALT: 68 U/L — ABNORMAL HIGH (ref 0–35)
AST: 65 U/L — ABNORMAL HIGH (ref 0–37)
AST: 79 U/L — ABNORMAL HIGH (ref 0–37)
Albumin: 3.1 g/dL — ABNORMAL LOW (ref 3.5–5.2)
Albumin: 3.6 g/dL (ref 3.5–5.2)
Alkaline Phosphatase: 82 U/L (ref 39–117)
Alkaline Phosphatase: 93 U/L (ref 39–117)
Anion gap: 13 (ref 5–15)
Anion gap: 15 (ref 5–15)
BUN: 21 mg/dL (ref 6–23)
BUN: 23 mg/dL (ref 6–23)
CO2: 28 mEq/L (ref 19–32)
CO2: 28 mEq/L (ref 19–32)
Calcium: 8.8 mg/dL (ref 8.4–10.5)
Calcium: 9.2 mg/dL (ref 8.4–10.5)
Chloride: 90 mEq/L — ABNORMAL LOW (ref 96–112)
Chloride: 92 mEq/L — ABNORMAL LOW (ref 96–112)
Creatinine, Ser: 2.9 mg/dL — ABNORMAL HIGH (ref 0.50–1.10)
Creatinine, Ser: 2.91 mg/dL — ABNORMAL HIGH (ref 0.50–1.10)
GFR calc Af Amer: 19 mL/min — ABNORMAL LOW (ref >90)
GFR calc Af Amer: 19 mL/min — ABNORMAL LOW (ref >90)
GFR calc non Af Amer: 17 mL/min — ABNORMAL LOW (ref >90)
GFR calc non Af Amer: 17 mL/min — ABNORMAL LOW (ref >90)
Glucose, Bld: 271 mg/dL — ABNORMAL HIGH (ref 70–99)
Glucose, Bld: 275 mg/dL — ABNORMAL HIGH (ref 70–99)
Potassium: 3.7 mEq/L (ref 3.7–5.3)
Potassium: 4.4 mEq/L (ref 3.7–5.3)
Sodium: 131 mEq/L — ABNORMAL LOW (ref 137–147)
Sodium: 135 mEq/L — ABNORMAL LOW (ref 137–147)
Total Bilirubin: 0.3 mg/dL (ref 0.3–1.2)
Total Bilirubin: 0.3 mg/dL (ref 0.3–1.2)
Total Protein: 7.5 g/dL (ref 6.0–8.3)
Total Protein: 8.4 g/dL — ABNORMAL HIGH (ref 6.0–8.3)

## 2014-12-12 LAB — POCT I-STAT 3, ART BLOOD GAS (G3+)
Acid-Base Excess: 1 mmol/L (ref 0.0–2.0)
Acid-Base Excess: 2 mmol/L (ref 0.0–2.0)
Bicarbonate: 29 mEq/L — ABNORMAL HIGH (ref 20.0–24.0)
Bicarbonate: 27.4 mEq/L — ABNORMAL HIGH (ref 20.0–24.0)
O2 Saturation: 86 %
O2 Saturation: 100 %
Patient temperature: 98.8
Patient temperature: 97
TCO2: 31 mmol/L (ref 0–100)
TCO2: 29 mmol/L (ref 0–100)
pCO2 arterial: 59.3 mmHg (ref 35.0–45.0)
pCO2 arterial: 45.3 mmHg — ABNORMAL HIGH (ref 35.0–45.0)
pH, Arterial: 7.297 — ABNORMAL LOW (ref 7.350–7.450)
pH, Arterial: 7.386 (ref 7.350–7.450)
pO2, Arterial: 58 mmHg — ABNORMAL LOW (ref 80.0–100.0)
pO2, Arterial: 204 mmHg — ABNORMAL HIGH (ref 80.0–100.0)

## 2014-12-12 LAB — URINE MICROSCOPIC-ADD ON

## 2014-12-12 LAB — CBC WITH DIFFERENTIAL/PLATELET
Basophils Absolute: 0 10*3/uL (ref 0.0–0.1)
Basophils Absolute: 0.1 10*3/uL (ref 0.0–0.1)
Basophils Relative: 0 % (ref 0–1)
Basophils Relative: 1 % (ref 0–1)
Eosinophils Absolute: 0 10*3/uL (ref 0.0–0.7)
Eosinophils Absolute: 0.1 10*3/uL (ref 0.0–0.7)
Eosinophils Relative: 0 % (ref 0–5)
Eosinophils Relative: 1 % (ref 0–5)
HCT: 30 % — ABNORMAL LOW (ref 36.0–46.0)
HCT: 33.1 % — ABNORMAL LOW (ref 36.0–46.0)
Hemoglobin: 10.8 g/dL — ABNORMAL LOW (ref 12.0–15.0)
Hemoglobin: 9.6 g/dL — ABNORMAL LOW (ref 12.0–15.0)
Lymphocytes Relative: 11 % — ABNORMAL LOW (ref 12–46)
Lymphocytes Relative: 17 % (ref 12–46)
Lymphs Abs: 1.3 10*3/uL (ref 0.7–4.0)
Lymphs Abs: 1.3 10*3/uL (ref 0.7–4.0)
MCH: 31.4 pg (ref 26.0–34.0)
MCH: 32.5 pg (ref 26.0–34.0)
MCHC: 32 g/dL (ref 30.0–36.0)
MCHC: 32.6 g/dL (ref 30.0–36.0)
MCV: 98 fL (ref 78.0–100.0)
MCV: 99.7 fL (ref 78.0–100.0)
Monocytes Absolute: 0.8 10*3/uL (ref 0.1–1.0)
Monocytes Absolute: 1 10*3/uL (ref 0.1–1.0)
Monocytes Relative: 11 % (ref 3–12)
Monocytes Relative: 8 % (ref 3–12)
Neutro Abs: 10 10*3/uL — ABNORMAL HIGH (ref 1.7–7.7)
Neutro Abs: 5.4 10*3/uL (ref 1.7–7.7)
Neutrophils Relative %: 70 % (ref 43–77)
Neutrophils Relative %: 81 % — ABNORMAL HIGH (ref 43–77)
Platelets: 335 10*3/uL (ref 150–400)
Platelets: 387 10*3/uL (ref 150–400)
RBC: 3.06 MIL/uL — ABNORMAL LOW (ref 3.87–5.11)
RBC: 3.32 MIL/uL — ABNORMAL LOW (ref 3.87–5.11)
RDW: 14.3 % (ref 11.5–15.5)
RDW: 14.4 % (ref 11.5–15.5)
WBC: 12.4 10*3/uL — ABNORMAL HIGH (ref 4.0–10.5)
WBC: 7.7 10*3/uL (ref 4.0–10.5)

## 2014-12-12 LAB — URINALYSIS, ROUTINE W REFLEX MICROSCOPIC
Glucose, UA: NEGATIVE mg/dL
Hgb urine dipstick: NEGATIVE
Ketones, ur: 15 mg/dL — AB
Nitrite: POSITIVE — AB
Protein, ur: 100 mg/dL — AB
Specific Gravity, Urine: 1.023 (ref 1.005–1.030)
Urobilinogen, UA: 0.2 mg/dL (ref 0.0–1.0)
pH: 5 (ref 5.0–8.0)

## 2014-12-12 LAB — POCT I-STAT, CHEM 8
BUN: 24 mg/dL — ABNORMAL HIGH (ref 6–23)
Calcium, Ion: 1.14 mmol/L (ref 1.12–1.23)
Chloride: 95 mEq/L — ABNORMAL LOW (ref 96–112)
Creatinine, Ser: 3.2 mg/dL — ABNORMAL HIGH (ref 0.50–1.10)
Glucose, Bld: 306 mg/dL — ABNORMAL HIGH (ref 70–99)
HCT: 36 % (ref 36.0–46.0)
Hemoglobin: 12.2 g/dL (ref 12.0–15.0)
Potassium: 4.1 mEq/L (ref 3.7–5.3)
Sodium: 134 mEq/L — ABNORMAL LOW (ref 137–147)
TCO2: 26 mmol/L (ref 0–100)

## 2014-12-12 LAB — LIPASE, BLOOD: Lipase: 50 U/L (ref 11–59)

## 2014-12-12 LAB — PRO B NATRIURETIC PEPTIDE: Pro B Natriuretic peptide (BNP): 24864 pg/mL — ABNORMAL HIGH (ref 0–125)

## 2014-12-12 LAB — GLUCOSE, CAPILLARY
Glucose-Capillary: 118 mg/dL — ABNORMAL HIGH (ref 70–99)
Glucose-Capillary: 131 mg/dL — ABNORMAL HIGH (ref 70–99)
Glucose-Capillary: 135 mg/dL — ABNORMAL HIGH (ref 70–99)
Glucose-Capillary: 280 mg/dL — ABNORMAL HIGH (ref 70–99)
Glucose-Capillary: 294 mg/dL — ABNORMAL HIGH (ref 70–99)

## 2014-12-12 LAB — TROPONIN I
Troponin I: <0.3 ng/mL (ref <0.30)
Troponin I: <0.3 ng/mL (ref <0.30)
Troponin I: 0.48 ng/mL (ref <0.30)

## 2014-12-12 LAB — TSH: TSH: 1.95 u[IU]/mL (ref 0.350–4.500)

## 2014-12-12 LAB — CK TOTAL AND CKMB (NOT AT ARMC)
CK, MB: 3.5 ng/mL (ref 0.3–4.0)
Relative Index: 3.2 — ABNORMAL HIGH (ref 0.0–2.5)
Total CK: 108 U/L (ref 7–177)

## 2014-12-12 LAB — APTT: aPTT: 31 seconds (ref 24–37)

## 2014-12-12 LAB — MAGNESIUM: Magnesium: 2.1 mg/dL (ref 1.5–2.5)

## 2014-12-12 LAB — PROTIME-INR
INR: 1.06 (ref 0.00–1.49)
Prothrombin Time: 13.9 seconds (ref 11.6–15.2)

## 2014-12-12 MED ORDER — CETYLPYRIDINIUM CHLORIDE 0.05 % MT LIQD
7.0000 mL | Freq: Two times a day (BID) | OROMUCOSAL | Status: DC
  Administered 2014-12-12 – 2014-12-21 (×18): 7 mL via OROMUCOSAL

## 2014-12-12 MED ORDER — NITROGLYCERIN IN D5W 200-5 MCG/ML-% IV SOLN
0.0000 ug/min | INTRAVENOUS | Status: DC
Start: 2014-12-12 — End: 2014-12-13
  Administered 2014-12-12: 60 ug/min via INTRAVENOUS
  Administered 2014-12-12: 20 ug via INTRAVENOUS

## 2014-12-12 MED ORDER — ATORVASTATIN CALCIUM 10 MG PO TABS
10.0000 mg | ORAL_TABLET | Freq: Every day | ORAL | Status: DC
  Administered 2014-12-12 – 2014-12-21 (×10): 10 mg via ORAL
  Filled 2014-12-12 (×10): qty 1

## 2014-12-12 MED ORDER — SODIUM CHLORIDE 0.9 % IV SOLN
INTRAVENOUS | Status: DC
  Administered 2014-12-12: 05:00:00 via INTRAVENOUS

## 2014-12-12 MED ORDER — ISOSORBIDE MONONITRATE ER 60 MG PO TB24
60.0000 mg | ORAL_TABLET | Freq: Every day | ORAL | Status: DC
  Administered 2014-12-12: 60 mg via ORAL
  Filled 2014-12-12 (×2): qty 1

## 2014-12-12 MED ORDER — LEVALBUTEROL HCL 0.63 MG/3ML IN NEBU: 0.6300 mg | INHALATION_SOLUTION | RESPIRATORY_TRACT | Status: DC | PRN

## 2014-12-12 MED ORDER — ASPIRIN 81 MG PO CHEW
81.0000 mg | CHEWABLE_TABLET | Freq: Every day | ORAL | Status: DC
  Administered 2014-12-12 – 2014-12-21 (×10): 81 mg via ORAL
  Filled 2014-12-12 (×9): qty 1

## 2014-12-12 MED ORDER — BISACODYL 10 MG RE SUPP
10.0000 mg | Freq: Every day | RECTAL | Status: DC | PRN
  Administered 2014-12-15 – 2014-12-20 (×3): 10 mg via RECTAL
  Filled 2014-12-12 (×3): qty 1

## 2014-12-12 MED ORDER — SODIUM CHLORIDE 0.9 % IJ SOLN
3.0000 mL | Freq: Two times a day (BID) | INTRAMUSCULAR | Status: DC
  Administered 2014-12-12 – 2014-12-21 (×19): 3 mL via INTRAVENOUS

## 2014-12-12 MED ORDER — ONDANSETRON HCL 4 MG/2ML IJ SOLN: 4.0000 mg | Freq: Four times a day (QID) | INTRAMUSCULAR | Status: DC | PRN

## 2014-12-12 MED ORDER — CHLORHEXIDINE GLUCONATE 0.12 % MT SOLN
15.0000 mL | Freq: Two times a day (BID) | OROMUCOSAL | Status: DC
Start: 2014-12-12 — End: 2014-12-13
  Administered 2014-12-12 – 2014-12-13 (×3): 15 mL via OROMUCOSAL
  Filled 2014-12-12 (×3): qty 15

## 2014-12-12 MED ORDER — FUROSEMIDE 10 MG/ML IJ SOLN
80.0000 mg | Freq: Once | INTRAMUSCULAR | Status: AC
  Administered 2014-12-12: 80 mg via INTRAVENOUS
  Filled 2014-12-12: qty 8

## 2014-12-12 MED ORDER — METOPROLOL SUCCINATE ER 50 MG PO TB24
50.0000 mg | ORAL_TABLET | Freq: Every day | ORAL | Status: DC
  Administered 2014-12-12: 50 mg via ORAL
  Filled 2014-12-12 (×2): qty 1

## 2014-12-12 MED ORDER — NITROGLYCERIN IN D5W 200-5 MCG/ML-% IV SOLN
INTRAVENOUS | Status: AC
  Administered 2014-12-12: 20 ug via INTRAVENOUS
  Filled 2014-12-12: qty 250

## 2014-12-12 MED ORDER — AMIODARONE LOAD VIA INFUSION
150.0000 mg | Freq: Once | INTRAVENOUS | Status: AC
Start: 2014-12-12 — End: 2014-12-12
  Administered 2014-12-12: 150 mg via INTRAVENOUS

## 2014-12-12 MED ORDER — ALBUTEROL SULFATE (2.5 MG/3ML) 0.083% IN NEBU: 2.5000 mg | INHALATION_SOLUTION | RESPIRATORY_TRACT | Status: DC | PRN

## 2014-12-12 MED ORDER — DARBEPOETIN ALFA 40 MCG/0.4ML IJ SOSY: 40.0000 ug | PREFILLED_SYRINGE | INTRAMUSCULAR | Status: DC

## 2014-12-12 MED ORDER — HEPARIN SODIUM (PORCINE) 5000 UNIT/ML IJ SOLN
5000.0000 [IU] | Freq: Three times a day (TID) | INTRAMUSCULAR | Status: DC
  Administered 2014-12-12 – 2014-12-21 (×27): 5000 [IU] via SUBCUTANEOUS
  Filled 2014-12-12 (×30): qty 1

## 2014-12-12 MED ORDER — POTASSIUM CHLORIDE 10 MEQ/100ML IV SOLN: 10.0000 meq | INTRAVENOUS | Status: DC

## 2014-12-12 MED ORDER — AMIODARONE HCL IN DEXTROSE 360-4.14 MG/200ML-% IV SOLN
INTRAVENOUS | Status: AC
  Filled 2014-12-12: qty 200

## 2014-12-12 MED ORDER — PROMETHAZINE HCL 25 MG RE SUPP: 12.5000 mg | RECTAL | Status: DC | PRN

## 2014-12-12 MED ORDER — LISINOPRIL 2.5 MG PO TABS
2.5000 mg | ORAL_TABLET | Freq: Every day | ORAL | Status: DC
  Administered 2014-12-12: 2.5 mg via ORAL
  Filled 2014-12-12 (×2): qty 1

## 2014-12-12 MED ORDER — ACETAMINOPHEN 325 MG PO TABS
650.0000 mg | ORAL_TABLET | Freq: Four times a day (QID) | ORAL | Status: DC | PRN
  Administered 2014-12-19: 650 mg via ORAL
  Filled 2014-12-12 (×2): qty 2

## 2014-12-12 MED ORDER — AMIODARONE HCL IN DEXTROSE 360-4.14 MG/200ML-% IV SOLN
60.0000 mg/h | INTRAVENOUS | Status: AC
  Administered 2014-12-12 (×2): 60 mg/h via INTRAVENOUS
  Filled 2014-12-12: qty 200

## 2014-12-12 MED ORDER — RISPERIDONE MICROSPHERES 25 MG IM SUSR
25.0000 mg | INTRAMUSCULAR | Status: DC
  Filled 2014-12-12: qty 2

## 2014-12-12 MED ORDER — SODIUM CHLORIDE 0.9 % IV SOLN: 125.0000 mg | INTRAVENOUS | Status: DC

## 2014-12-12 MED ORDER — SODIUM CHLORIDE 0.9 % IV BOLUS (SEPSIS)
250.0000 mL | Freq: Once | INTRAVENOUS | Status: AC
  Administered 2014-12-12: 250 mL via INTRAVENOUS

## 2014-12-12 MED ORDER — CEFTRIAXONE SODIUM IN DEXTROSE 20 MG/ML IV SOLN
1.0000 g | INTRAVENOUS | Status: DC
  Administered 2014-12-13 – 2014-12-14 (×2): 1 g via INTRAVENOUS
  Filled 2014-12-12 (×3): qty 50

## 2014-12-12 MED ORDER — CALCIUM ACETATE 667 MG PO CAPS
667.0000 mg | ORAL_CAPSULE | Freq: Three times a day (TID) | ORAL | Status: DC
  Administered 2014-12-12 – 2014-12-21 (×23): 667 mg via ORAL
  Filled 2014-12-12 (×31): qty 1

## 2014-12-12 MED ORDER — METOPROLOL TARTRATE 1 MG/ML IV SOLN
5.0000 mg | Freq: Once | INTRAVENOUS | Status: AC
  Administered 2014-12-12: 5 mg via INTRAVENOUS
  Filled 2014-12-12: qty 5

## 2014-12-12 MED ORDER — ONDANSETRON HCL 4 MG PO TABS: 4.0000 mg | ORAL_TABLET | Freq: Four times a day (QID) | ORAL | Status: DC | PRN

## 2014-12-12 MED ORDER — DILTIAZEM HCL 25 MG/5ML IV SOLN
20.0000 mg | Freq: Once | INTRAVENOUS | Status: AC
  Administered 2014-12-12: 20 mg via INTRAVENOUS
  Filled 2014-12-12: qty 5

## 2014-12-12 MED ORDER — CLONIDINE HCL 0.1 MG/24HR TD PTWK
0.1000 mg | MEDICATED_PATCH | TRANSDERMAL | Status: DC
  Administered 2014-12-12: 0.1 mg via TRANSDERMAL
  Filled 2014-12-12: qty 1

## 2014-12-12 MED ORDER — INSULIN ASPART 100 UNIT/ML ~~LOC~~ SOLN
0.0000 [IU] | Freq: Four times a day (QID) | SUBCUTANEOUS | Status: DC
  Administered 2014-12-12 – 2014-12-13 (×3): 2 [IU] via SUBCUTANEOUS
  Administered 2014-12-15 (×2): 3 [IU] via SUBCUTANEOUS
  Administered 2014-12-16: 2 [IU] via SUBCUTANEOUS
  Administered 2014-12-16: 3 [IU] via SUBCUTANEOUS
  Administered 2014-12-16 – 2014-12-19 (×6): 2 [IU] via SUBCUTANEOUS

## 2014-12-12 MED ORDER — IOHEXOL 350 MG/ML SOLN
75.0000 mL | Freq: Once | INTRAVENOUS | Status: AC | PRN
  Administered 2014-12-12: 75 mL via INTRAVENOUS

## 2014-12-12 MED ORDER — DEXTROSE 5 % IV SOLN
1.0000 g | Freq: Once | INTRAVENOUS | Status: AC
  Administered 2014-12-12: 1 g via INTRAVENOUS
  Filled 2014-12-12: qty 10

## 2014-12-12 MED ORDER — HYDRALAZINE HCL 50 MG PO TABS
100.0000 mg | ORAL_TABLET | Freq: Three times a day (TID) | ORAL | Status: DC
  Administered 2014-12-12: 100 mg via ORAL
  Filled 2014-12-12 (×7): qty 2

## 2014-12-12 MED ORDER — NITROGLYCERIN 2 % TD OINT
1.0000 [in_us] | TOPICAL_OINTMENT | Freq: Four times a day (QID) | TRANSDERMAL | Status: DC
  Filled 2014-12-12: qty 30

## 2014-12-12 MED ORDER — CLOPIDOGREL BISULFATE 75 MG PO TABS
75.0000 mg | ORAL_TABLET | Freq: Every day | ORAL | Status: DC
  Administered 2014-12-12 – 2014-12-21 (×10): 75 mg via ORAL
  Filled 2014-12-12 (×11): qty 1

## 2014-12-12 MED ORDER — INSULIN ASPART 100 UNIT/ML ~~LOC~~ SOLN: 0.0000 [IU] | Freq: Four times a day (QID) | SUBCUTANEOUS | Status: DC

## 2014-12-12 MED ORDER — ACETAMINOPHEN 650 MG RE SUPP: 650.0000 mg | Freq: Four times a day (QID) | RECTAL | Status: DC | PRN

## 2014-12-12 MED ORDER — NITROGLYCERIN 0.4 MG SL SUBL: 0.4000 mg | SUBLINGUAL_TABLET | SUBLINGUAL | Status: DC | PRN

## 2014-12-12 MED ORDER — AMIODARONE HCL IN DEXTROSE 360-4.14 MG/200ML-% IV SOLN
30.0000 mg/h | INTRAVENOUS | Status: DC
  Administered 2014-12-12 – 2014-12-16 (×8): 30 mg/h via INTRAVENOUS
  Filled 2014-12-12 (×18): qty 200

## 2014-12-12 MED ORDER — INSULIN ASPART 100 UNIT/ML ~~LOC~~ SOLN
0.0000 [IU] | Freq: Four times a day (QID) | SUBCUTANEOUS | Status: DC
  Administered 2014-12-12 (×2): 8 [IU] via SUBCUTANEOUS

## 2014-12-12 NOTE — Progress Notes (Signed)
At 0900 noted patients increased work of breathing, coarse crackles, and increased anxiety. Drs. Byrum, Hilty, and Schertz notified. Dr. Jonnie Finner here to evaluate patient and ordered received for patient to have bedside dialysis.

## 2014-12-12 NOTE — Progress Notes (Signed)
Called patient's son to update him on his mother's current condition. Additionally, let me know that she was being transfer to the ICU and her new room number. Questions answered.

## 2014-12-12 NOTE — H&P (Signed)
[facility] History and Physical  Patient: [name]  [id number]  DOB: December 16, 1956  DOS: the patient was seen and examined on 12/12/2014 PCP: No primary care provider on file.  Chief Complaint: Unresponsive episode  HPI: [name] is a 58 y.o. female with Past medical history of ESRD on hemodialysis, dyslipidemia, hypertension, recent admission for anterior wall MI medically managed, diabetes mellitus, deaf and blind since childhood The patient was brought in from the nursing home. The history was obtained with the help of son as well as ED documentation. As per the nursing home the patient lives with her husband, had her dialysis earlier today on Friday, the husband apparently her sound and found the patient unconscious. Nursing home personnel found there was no pulse and CPR was initiated, the AED found non-shockable rhythm. Patient had return of pulse with roughly 6 minutes of chest compression and lidocaine which was inserted with io. Before this happened as per the nursing home personnel there was no acute event and patient did not have any acute complaints.  The patient is coming from [facility]. And at her baseline dependent for most of her ADL.  Review of Systems: as mentioned in the history of present illness.  A Comprehensive review of the other systems is negative.  Past Medical History  Diagnosis Date  . Hyperlipidemia   . Hypertension     a. Has previously refused blood pressure meds.   . Deaf     Divorced from husband but lives with him. Has daughter but she does not care for her.  . Bilateral leg edema     a. Chronic.  Marland Kitchen Psychiatric disorder     She frequently exhibits paranoia and has been diagnosed with psychotic d/o NOS during hospital stay in the past. She is tangetial and perseverative during her visits. She apparently has had a bad experience with mental health in [place] in the past and refuses to discuss mental issues for fear that she will be sent back there.  Her paranoia, communication issues, financial woes and lack of fam  . Fibroid uterus   . Anemia     Due to fibroids.  . Heart murmur   . Diabetes mellitus     a. Per PCP note 2012 (A1C 9.2) - pt unwilling to take meds and was educated on risk of uncontrolled DM.  b. A1C 5.9 in 11/2013.  Marland Kitchen Renal disorder   . Coronary artery disease   . MI (myocardial infarction)   . Renal insufficiency    Past Surgical History  Procedure Laterality Date  . No past surgeries    . Insertion of dialysis catheter Right 01/02/2014    Procedure: INSERTION OF DIALYSIS CATHETER;  Surgeon: Rosetta Posner, MD;  Location: [facility];  Service: Vascular;  Laterality: Right;  . Av fistula placement Left 01/02/2014    Procedure: ARTERIOVENOUS (AV) FISTULA CREATION;  Surgeon: Rosetta Posner, MD;  Location: [facility];  Service: Vascular;  Laterality: Left;   Social History:  reports that she has never smoked. She does not have any smokeless tobacco history on file. She reports that she does not drink alcohol or use illicit drugs.  No Known Allergies  Family History  Problem Relation Age of Onset  . Other Father     Drowned from fishing?  . Heart disease      Prior to Admission medications   Medication Sig Start Date End Date Taking? Authorizing Provider  aspirin 81 MG tablet Take 81 mg  by mouth daily. For hypertension   Yes Historical Provider, MD  atorvastatin (LIPITOR) 10 MG tablet Take 10 mg by mouth daily.   Yes Historical Provider, MD  bisacodyl (DULCOLAX) 10 MG suppository Place 1 suppository (10 mg total) rectally daily as needed for mild constipation, moderate constipation or severe constipation. 01/08/14  Yes Kelvin Cellar, MD  calcium acetate (PHOSLO) 667 MG capsule Take 1 capsule (667 mg total) by mouth 3 (three) times daily with meals. 01/08/14  Yes Kelvin Cellar, MD  cloNIDine (CATAPRES - DOSED IN MG/24 HR) 0.1 mg/24hr patch Place 1 patch (0.1 mg total) onto the skin once a week. For this week, then off. 11/26/14   Yes Ripudeep K Rai, MD  clopidogrel (PLAVIX) 75 MG tablet Take 1 tablet (75 mg total) by mouth daily. 11/26/14  Yes Ripudeep Krystal Eaton, MD  Docusate Sodium (DSS) 100 MG CAPS Take 100 mg by mouth 2 (two) times daily. For constipation 01/08/14  Yes Kelvin Cellar, MD  hydrALAZINE (APRESOLINE) 100 MG tablet Take 1 tablet (100 mg total) by mouth every 8 (eight) hours. 01/08/14  Yes Kelvin Cellar, MD  isosorbide mononitrate (IMDUR) 60 MG 24 hr tablet Take 1 tablet (60 mg total) by mouth daily. 11/26/14  Yes Ripudeep Krystal Eaton, MD  lactulose (CHRONULAC) 10 GM/15ML solution Take 30 mLs (20 g total) by mouth 2 (two) times daily as needed for moderate constipation. 01/08/14  Yes Kelvin Cellar, MD  levalbuterol Penne Lash) 0.63 MG/3ML nebulizer solution Take 3 mLs (0.63 mg total) by nebulization every 2 (two) hours as needed for wheezing or shortness of breath. 01/08/14  Yes Kelvin Cellar, MD  lisinopril (PRINIVIL,ZESTRIL) 2.5 MG tablet Take 1 tablet (2.5 mg total) by mouth daily. 11/26/14  Yes Ripudeep Krystal Eaton, MD  metoprolol succinate (TOPROL-XL) 50 MG 24 hr tablet Take 1 tablet (50 mg total) by mouth daily. Take with or immediately following a meal. 11/26/14  Yes Ripudeep Krystal Eaton, MD  Multiple Vitamin (MULTIVITAMIN WITH MINERALS) TABS tablet Take 1 tablet by mouth daily.   Yes Historical Provider, MD  multivitamin (RENA-VIT) TABS tablet Take 1 tablet by mouth at bedtime. 01/08/14  Yes Kelvin Cellar, MD  naphazoline-pheniramine (NAPHCON-A) 0.025-0.3 % ophthalmic solution Place 1 drop into both eyes 4 (four) times daily as needed for irritation. 01/08/14  Yes Kelvin Cellar, MD  nitroGLYCERIN (NITROSTAT) 0.4 MG SL tablet Place 1 tablet (0.4 mg total) under the tongue every 5 (five) minutes as needed for chest pain. 11/26/14  Yes Ripudeep Krystal Eaton, MD  promethazine (PHENERGAN) 12.5 MG suppository Place 12.5 mg rectally every 4 (four) hours as needed for nausea or vomiting.   Yes Historical Provider, MD  risperiDONE microspheres  (RISPERDAL CONSTA) 25 MG injection Inject 25 mg into the muscle every 14 (fourteen) days. For psychosis   Yes Historical Provider, MD  darbepoetin (ARANESP) 100 MCG/0.5ML SOLN injection Inject 0.5 mLs (100 mcg total) into the skin every Tuesday at 6 PM. Patient not taking: Reported on 12/12/2014 01/08/14   Kelvin Cellar, MD    Physical Exam: Filed Vitals:   12/12/14 0140 12/12/14 0230 12/12/14 0245 12/12/14 0300  BP: 122/95 126/63    Pulse: 123 84 84 106  Temp:    97.7 F (36.5 C)  TempSrc:    Oral  Resp: 18 13 16 25   SpO2: 97% 99% 98%     General: Alert, Awake and follows command Appear in moderate distress Eyes: PERRL ENT: Oral Mucosa clear moist. Neck: Difficult to assess JVD Cardiovascular: S1 and  S2 Present, no Murmur, Peripheral Pulses Present Respiratory: Bilateral Air entry equal and Decreased, no Crackles, upper respiratory wheezes Abdomen: Bowel Sound present, Soft and non tender Skin: no Rash Extremities: no Pedal edema, no calf tenderness Neurologic: Equal strength bilaterally, no facial asymmetry, withdraws to painful stimuli and lower extremity, Babinski negative, knee reflexes present.  Labs on Admission:  CBC:  Recent Labs Lab 12/11/14 2333 12/11/14 2350 12/12/14 0348  WBC 7.7  --   --   NEUTROABS 5.4  --   --   HGB 9.6* 12.2 12.2  HCT 30.0* 36.0 36.0  MCV 98.0  --   --   PLT 335  --   --     CMP     Component Value Date/Time   NA 134* 12/12/2014 0348   K 4.1 12/12/2014 0348   CL 95* 12/12/2014 0348   CO2 28 12/11/2014 2333   GLUCOSE 306* 12/12/2014 0348   BUN 24* 12/12/2014 0348   BUN 64* 09/02/2014   CREATININE 3.20* 12/12/2014 0348   CREATININE 5.4* 09/02/2014   CREATININE 0.76 05/23/2011 0956   CALCIUM 8.8 12/11/2014 2333   PROT 7.5 12/11/2014 2333   ALBUMIN 3.1* 12/11/2014 2333   AST 79* 12/11/2014 2333   ALT 68* 12/11/2014 2333   ALKPHOS 82 12/11/2014 2333   BILITOT 0.3 12/11/2014 2333   GFRNONAA 17* 12/11/2014 2333   GFRAA 19*  12/11/2014 2333     Recent Labs Lab 12/11/14 2333  LIPASE 50   No results for input(s): AMMONIA in the last 168 hours.  No results for input(s): CKTOTAL, CKMB, CKMBINDEX, TROPONINI in the last 168 hours. BNP (last 3 results)  Recent Labs  11/19/14 2335  PROBNP 19448.0*    Radiological Exams on Admission: Ct Head Wo Contrast  12/12/2014   CLINICAL DATA:  Acute onset of altered mental status. Status post last fall with asystole, status post CPR and resuscitation. Initial encounter. Concern for head or cervical spine injury.  EXAM: CT HEAD WITHOUT CONTRAST  CT CERVICAL SPINE WITHOUT CONTRAST  TECHNIQUE: Multidetector CT imaging of the head and cervical spine was performed following the standard protocol without intravenous contrast. Multiplanar CT image reconstructions of the cervical spine were also generated.  COMPARISON:  None.  FINDINGS: CT HEAD FINDINGS  There is no evidence of acute infarction, mass lesion, or intra- or extra-axial hemorrhage on CT.  Prominence of the ventricles and sulci reflects mild cortical volume loss. Scattered periventricular and subcortical white matter change likely reflects small vessel ischemic microangiopathy.  The brainstem and fourth ventricle are within normal limits. The basal ganglia are unremarkable in appearance. The cerebral hemispheres demonstrate grossly normal gray-white differentiation. No mass effect or midline shift is seen.  There is no evidence of fracture; visualized osseous structures are unremarkable in appearance. The visualized portions of the orbits are within normal limits. The paranasal sinuses and mastoid air cells are well-aerated. No significant soft tissue abnormalities are seen.  CT CERVICAL SPINE FINDINGS  There is no evidence of fracture or subluxation. Vertebral bodies demonstrate normal height and alignment. There is mild narrowing of the intervertebral disc spaces at C3-C4 and C4-C5, with associated anterior and posterior disc  osteophyte complexes. Prevertebral soft tissues are within normal limits.  A 0.9 cm hypodensity is noted at the left thyroid lobe. There is mild interstitial prominence at the lung apices. No significant soft tissue abnormalities are seen.  IMPRESSION: 1. No evidence of traumatic intracranial injury or fracture. 2. No evidence of fracture or subluxation  along the cervical spine. 3. Mild cortical volume loss and scattered small vessel ischemic microangiopathy. 4. Minimal degenerative change at the upper cervical spine. 5. Sub-centimeter thyroid nodule(s) noted, too small to characterize, but most likely benign in the absence of known clinical risk factors for thyroid carcinoma. 6. Mild interstitial prominence at the lung apices.   Electronically Signed   By: Garald Balding M.D.   On: 12/12/2014 00:02   Ct Angio Chest Pe W/cm &/or Wo Cm  12/12/2014   CLINICAL DATA:  Acute onset of chest pain and shortness of breath. Patient was unresponsive, with asystole, status post cardiac resuscitation. Initial encounter.  EXAM: CT ANGIOGRAPHY CHEST WITH CONTRAST  TECHNIQUE: Multidetector CT imaging of the chest was performed using the standard protocol during bolus administration of intravenous contrast. Multiplanar CT image reconstructions and MIPs were obtained to evaluate the vascular anatomy.  CONTRAST:  82mL OMNIPAQUE IOHEXOL 350 MG/ML SOLN  COMPARISON:  Chest radiograph performed 12/11/2014  FINDINGS: There is no evidence of pulmonary embolus.  Mild peripheral nodular densities are thought to reflect atelectasis. There is slight interstitial prominence, without definite pulmonary edema. There is no evidence of significant focal consolidation, pleural effusion or pneumothorax. No suspicious masses are identified; no abnormal focal contrast enhancement is seen.  There is mild diffuse peribronchial thickening, particularly at the hila. No definite mediastinal lymphadenopathy is seen. No pericardial effusion is identified.  The great vessels are grossly unremarkable in appearance. No axillary lymphadenopathy is seen. Multiple hypodensities are seen within the thyroid gland, measuring up to 1.4 cm in size, with minimal calcification.  The visualized portions of the liver and spleen are unremarkable.  No acute osseous abnormalities are seen.  Review of the MIP images confirms the above findings.  IMPRESSION: 1. No evidence of pulmonary embolus. 2. Slight interstitial prominence noted, without definite pulmonary edema. Mild peripheral nodular densities are thought to reflect atelectasis. Lungs otherwise grossly clear. 3. Mild diffuse peribronchial thickening noted, nonspecific in appearance. 4. Multiple hypodensities within the thyroid gland measuring up to 1.4 cm in size. Consider further evaluation with thyroid ultrasound. If patient is clinically hyperthyroid, consider nuclear medicine thyroid uptake and scan.   Electronically Signed   By: Garald Balding M.D.   On: 12/12/2014 01:51   Ct Cervical Spine Wo Contrast  12/12/2014   CLINICAL DATA:  Acute onset of altered mental status. Status post last fall with asystole, status post CPR and resuscitation. Initial encounter. Concern for head or cervical spine injury.  EXAM: CT HEAD WITHOUT CONTRAST  CT CERVICAL SPINE WITHOUT CONTRAST  TECHNIQUE: Multidetector CT imaging of the head and cervical spine was performed following the standard protocol without intravenous contrast. Multiplanar CT image reconstructions of the cervical spine were also generated.  COMPARISON:  None.  FINDINGS: CT HEAD FINDINGS  There is no evidence of acute infarction, mass lesion, or intra- or extra-axial hemorrhage on CT.  Prominence of the ventricles and sulci reflects mild cortical volume loss. Scattered periventricular and subcortical white matter change likely reflects small vessel ischemic microangiopathy.  The brainstem and fourth ventricle are within normal limits. The basal ganglia are unremarkable in  appearance. The cerebral hemispheres demonstrate grossly normal gray-white differentiation. No mass effect or midline shift is seen.  There is no evidence of fracture; visualized osseous structures are unremarkable in appearance. The visualized portions of the orbits are within normal limits. The paranasal sinuses and mastoid air cells are well-aerated. No significant soft tissue abnormalities are seen.  CT CERVICAL SPINE FINDINGS  There is no evidence of fracture or subluxation. Vertebral bodies demonstrate normal height and alignment. There is mild narrowing of the intervertebral disc spaces at C3-C4 and C4-C5, with associated anterior and posterior disc osteophyte complexes. Prevertebral soft tissues are within normal limits.  A 0.9 cm hypodensity is noted at the left thyroid lobe. There is mild interstitial prominence at the lung apices. No significant soft tissue abnormalities are seen.  IMPRESSION: 1. No evidence of traumatic intracranial injury or fracture. 2. No evidence of fracture or subluxation along the cervical spine. 3. Mild cortical volume loss and scattered small vessel ischemic microangiopathy. 4. Minimal degenerative change at the upper cervical spine. 5. Sub-centimeter thyroid nodule(s) noted, too small to characterize, but most likely benign in the absence of known clinical risk factors for thyroid carcinoma. 6. Mild interstitial prominence at the lung apices.   Electronically Signed   By: Garald Balding M.D.   On: 12/12/2014 00:02   Dg Chest Port 1 View  12/12/2014   CLINICAL DATA:  Status post cardiac arrest and resuscitation. Initial encounter.  EXAM: PORTABLE CHEST - 1 VIEW  COMPARISON:  Chest radiograph from 01/02/2014  FINDINGS: There is elevation of the right hemidiaphragm. There is no evidence of focal opacification, pleural effusion or pneumothorax.  The cardiomediastinal silhouette is borderline normal in size. No acute osseous abnormalities are seen. A small left cervical rib is  incidentally noted.  IMPRESSION: 1. Elevation of the right hemidiaphragm; lungs remain grossly clear. 2. Small left cervical rib incidentally noted.   Electronically Signed   By: Garald Balding M.D.   On: 12/12/2014 00:04    EKG: Independently reviewed. nonspecific ST and T waves changes, sinus tachycardia.  Assessment/Plan Principal Problem:   Cardiac arrest Active Problems:   Essential hypertension   Diabetes mellitus with ESRD (end-stage renal disease)   NSVT (nonsustained ventricular tachycardia)   Chronic diastolic CHF (congestive heart failure)   Cardiomyopathy, ischemic-EF 30-35%   Blind   Unresponsive episode   1. Cardiac arrest The patient presents with an episode of unresponsiveness. Apparently there was no pus but she regained her consciousness with CPR and lidocaine. AED found no shockable rhythm. Cardiology evaluated the patient and felt possible etiology of patient's unresponsiveness is arrhythmia rather than a V. tach and V. fib since the rhythm was not shockable. CT chest PE protocol is negative for any pulmonary embolism. Patient does not appear to have any acute change in mental status. With this currently the patient will be admitted in stepdown unit. We will monitor serial troponin. An echo program in the morning. Monitor her on telemetry. Continuing her home medications. She will remain nothing by mouth after midnight.  2. Diabetes mellitus. Continue home or on sliding scale.  3. 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 Patient had hemodialysis on Friday. Continue regular hemodialysis consult nephrology in morning.  4. Thyroid nodule. Check TSH free T4  Advance goals of care discussion: Full code   Consults: Cardiology  DVT Prophylaxis: subcutaneous Heparin Nutrition: Nothing by mouth  Family Communication: Son was present at bedside, opportunity was given to ask question and all questions were answered satisfactorily  at the time of interview. Disposition: Admitted to inpatient in step-down unit.  Author: Berle Mull, MD [facility] Pager: [phone] 12/12/2014,     Addendum: The patient had an period of V. tach on telemetry. Out of that the patient become short of breath and was in respiratory distress. Blood pressure increased to 190/80. Oxygen saturations  were in 90s. Heart rate was in 100 210 sinus tachycardia. Patient's examination shows bilateral crackles and rhonchi. Results for KAYRA, COMEAUX (MRN [id number]) as of 12/12/2014 04:07  12/12/2014 03:46  Sample type ARTERIAL  pH, Arterial 7.297 (L)  pCO2 arterial 59.3 (HH)  pO2, Arterial 58.0 (L)  Bicarbonate 29.0 (H)  TCO2 31  Acid-Base Excess 1.0  O2 Saturation 86.0  Patient temperature 98.8 F  Collection site RADIAL, ALLEN'S T...   Consulted PCCM as well as cardiology PCCM has accepted the pt on their service, and I highly appreciate their as well as cardiology Dr Tommi Rumps. Amiodarone and nitroglycerin drip ordered.  PATEL, PRANAV 4:08 AM 12/12/2014    If 7PM-7AM, please contact night-coverage [email or web address] Password [email or web address]

## 2014-12-12 NOTE — ED Notes (Signed)
The son wants it known he is the healthcare power of attorney.  He makes all the decisions and that his sister does not have the authority to make any decisions regarding his mothers care

## 2014-12-12 NOTE — ED Notes (Signed)
The pts son does not remember if the pt has had an irregular heart rate in the past.  He reports that she had a heart attack thanksgiving this year

## 2014-12-12 NOTE — Progress Notes (Signed)
Interpreter called to help facilitate communication with pt. Interpreter able to sign into patient hand and tell pt she is in hospital and sister of pt is at bedside. Interpreter asked pt if within pain in body, and pt signed "no." Provided emotional support to pt through interpreter, and pt appeared calmer after communicating with interpreter.

## 2014-12-12 NOTE — ED Notes (Signed)
Cardiologist here to see

## 2014-12-12 NOTE — Progress Notes (Signed)
[facility] Progress Note Patient Name: [name] DOB: Oct 01, 1956 MRN: [id number]   Date of Service  12/12/2014  HPI/Events of Note  Patient noted be mildly hypotensive since dialysis, sbp in the 51s.  Has a hx of HTN on nitro gtt  eICU Interventions  -Hold nitro gtt and bp med -give 250cc NS bolus      Intervention Category Intermediate Interventions: Hypovolemia - evaluation and management;Hypotension - evaluation and management  Teghan Philbin 12/12/2014, 10:20 PM

## 2014-12-12 NOTE — Procedures (Signed)
I was present at this dialysis session, have reviewed the session itself and made  appropriate changes  Kelly Splinter MD (pgr) [phone]    [phone] 12/12/2014, 12:01 PM

## 2014-12-12 NOTE — Progress Notes (Signed)
Pts. Sister expresses that Pt is not at baseline neurologically. Sister states she is "not herself," "she usually signs more," and she states that she since fell "something is not right."

## 2014-12-12 NOTE — Consult Note (Addendum)
Renal Service Consult Note [place] 12/12/2014 Roney Jaffe D Requesting Physician:  Dr Lincoln Maxin  Reason for Consult:  ESRD patient with LOC episode at SNF then NSVT and pulm edema here HPI: The patient is a 58 y.o. year-old with hx of HTN, deafness, blindness, ESRD, MI , ischemic CM EF 30-35% had LOC episode/ arrest at SNF. Rec'd about 6 minutes reportedly of CPR then had return of circulation and was brought to ED.  Her she was seen to have run of NSVT.  Overnight had resp distress and required Bipap. We were called this am to see her for ESRD.  Bipap was removed 1 hour ago due to agitation, staff says she sounded better with bipap on.  Patient no verbal history available, she is mute for the most part, uses hand signals.   Chart review: 11/05 - acute blood loss anemia, menometrorrhagia, uterine fibroids 10/10 - uncontrolled HTN and DM rx with BP meds and insulin; also seen by psych for question of paranoia about seeing [facility] providers but was not found to have any frank psychosis. Also deaf and communicated with sign language 1/11 - RLE edema admitted for possible cellulitis, dopplers negative, dc'd home. DM with foot ulcer R third toe, HTN, deafness 12/14 - SOB, gen weakness, SVT w rates 170's > admitted , rx with empiric AB for PNA, renal failure creat 3.5, vol excess not responding to diuretics so started on HD. Improved with HD. DC'd to SNF, outpatient HD set up 11/26 - 11/26/14 >  SOB and chest pain, sent from SNF, blind /deaf > SOB x 2 weeks, progressive, mild nausea. Required bipap in ED. CXR pulm edema, K high rx with HD and meds.  Cardiology saw, pt had NSTEMI, rx with IV heparin. Nuclear stress test showed fixed defects involving apex, ant/lat and ant/inf walls, no ischemia, EF 35%.  Not a candidate for invasive procedures per cards d/t comorbidities, medical Rx w asa, statin, hydralazine, nitrates, BB. ACEi added.     Past Medical History  Past  Medical History  Diagnosis Date  . Hyperlipidemia   . Hypertension     a. Has previously refused blood pressure meds.   . Deaf     Divorced from husband but lives with him. Has daughter but she does not care for her.  . Bilateral leg edema     a. Chronic.  Marland Kitchen Psychiatric disorder     She frequently exhibits paranoia and has been diagnosed with psychotic d/o NOS during hospital stay in the past. She is tangetial and perseverative during her visits. She apparently has had a bad experience with mental health in [place] in the past and refuses to discuss mental issues for fear that she will be sent back there. Her paranoia, communication issues, financial woes and lack of fam  . Fibroid uterus   . Anemia     Due to fibroids.  . Heart murmur   . Diabetes mellitus     a. Per PCP note 2012 (A1C 9.2) - pt unwilling to take meds and was educated on risk of uncontrolled DM.  b. A1C 5.9 in 11/2013.  Marland Kitchen Renal disorder   . Coronary artery disease   . MI (myocardial infarction)   . Renal insufficiency    Past Surgical History  Past Surgical History  Procedure Laterality Date  . No past surgeries    . Insertion of dialysis catheter Right 01/02/2014    Procedure: INSERTION OF DIALYSIS CATHETER;  Surgeon: Sherren Mocha  Katina Dung, MD;  Location: [place];  Service: Vascular;  Laterality: Right;  . Av fistula placement Left 01/02/2014    Procedure: ARTERIOVENOUS (AV) FISTULA CREATION;  Surgeon: Rosetta Posner, MD;  Location: [facility] OR;  Service: Vascular;  Laterality: Left;   Family History  Family History  Problem Relation Age of Onset  . Other Father     Drowned from fishing?  . Heart disease     Social History  reports that she has never smoked. She does not have any smokeless tobacco history on file. She reports that she does not drink alcohol or use illicit drugs. Allergies No Known Allergies Home medications Prior to Admission medications   Medication Sig Start Date End Date Taking? Authorizing Provider  aspirin 81  MG tablet Take 81 mg by mouth daily. For hypertension   Yes Historical Provider, MD  atorvastatin (LIPITOR) 10 MG tablet Take 10 mg by mouth daily.   Yes Historical Provider, MD  bisacodyl (DULCOLAX) 10 MG suppository Place 1 suppository (10 mg total) rectally daily as needed for mild constipation, moderate constipation or severe constipation. 01/08/14  Yes Kelvin Cellar, MD  calcium acetate (PHOSLO) 667 MG capsule Take 1 capsule (667 mg total) by mouth 3 (three) times daily with meals. 01/08/14  Yes Kelvin Cellar, MD  cloNIDine (CATAPRES - DOSED IN MG/24 HR) 0.1 mg/24hr patch Place 1 patch (0.1 mg total) onto the skin once a week. For this week, then off. 11/26/14  Yes Ripudeep K Rai, MD  clopidogrel (PLAVIX) 75 MG tablet Take 1 tablet (75 mg total) by mouth daily. 11/26/14  Yes Ripudeep Krystal Eaton, MD  Docusate Sodium (DSS) 100 MG CAPS Take 100 mg by mouth 2 (two) times daily. For constipation 01/08/14  Yes Kelvin Cellar, MD  hydrALAZINE (APRESOLINE) 100 MG tablet Take 1 tablet (100 mg total) by mouth every 8 (eight) hours. 01/08/14  Yes Kelvin Cellar, MD  isosorbide mononitrate (IMDUR) 60 MG 24 hr tablet Take 1 tablet (60 mg total) by mouth daily. 11/26/14  Yes Ripudeep Krystal Eaton, MD  lactulose (CHRONULAC) 10 GM/15ML solution Take 30 mLs (20 g total) by mouth 2 (two) times daily as needed for moderate constipation. 01/08/14  Yes Kelvin Cellar, MD  levalbuterol Penne Lash) 0.63 MG/3ML nebulizer solution Take 3 mLs (0.63 mg total) by nebulization every 2 (two) hours as needed for wheezing or shortness of breath. 01/08/14  Yes Kelvin Cellar, MD  lisinopril (PRINIVIL,ZESTRIL) 2.5 MG tablet Take 1 tablet (2.5 mg total) by mouth daily. 11/26/14  Yes Ripudeep Krystal Eaton, MD  metoprolol succinate (TOPROL-XL) 50 MG 24 hr tablet Take 1 tablet (50 mg total) by mouth daily. Take with or immediately following a meal. 11/26/14  Yes Ripudeep Krystal Eaton, MD  Multiple Vitamin (MULTIVITAMIN WITH MINERALS) TABS tablet Take 1 tablet by  mouth daily.   Yes Historical Provider, MD  multivitamin (RENA-VIT) TABS tablet Take 1 tablet by mouth at bedtime. 01/08/14  Yes Kelvin Cellar, MD  naphazoline-pheniramine (NAPHCON-A) 0.025-0.3 % ophthalmic solution Place 1 drop into both eyes 4 (four) times daily as needed for irritation. 01/08/14  Yes Kelvin Cellar, MD  nitroGLYCERIN (NITROSTAT) 0.4 MG SL tablet Place 1 tablet (0.4 mg total) under the tongue every 5 (five) minutes as needed for chest pain. 11/26/14  Yes Ripudeep Krystal Eaton, MD  promethazine (PHENERGAN) 12.5 MG suppository Place 12.5 mg rectally every 4 (four) hours as needed for nausea or vomiting.   Yes Historical Provider, MD  risperiDONE microspheres (RISPERDAL CONSTA) 25 MG  injection Inject 25 mg into the muscle every 14 (fourteen) days. For psychosis   Yes Historical Provider, MD  darbepoetin (ARANESP) 100 MCG/0.5ML SOLN injection Inject 0.5 mLs (100 mcg total) into the skin every Tuesday at 6 PM. Patient not taking: Reported on 12/12/2014 01/08/14   Kelvin Cellar, MD   Liver Function Tests  Recent Labs Lab 12/11/14 2333 12/12/14 0330  AST 79* 65*  ALT 68* 67*  ALKPHOS 82 93  BILITOT 0.3 0.3  PROT 7.5 8.4*  ALBUMIN 3.1* 3.6    Recent Labs Lab 12/11/14 2333  LIPASE 50   CBC  Recent Labs Lab 12/11/14 2333 12/11/14 2350 12/12/14 0330 12/12/14 0348  WBC 7.7  --  12.4*  --   NEUTROABS 5.4  --  10.0*  --   HGB 9.6* 12.2 10.8* 12.2  HCT 30.0* 36.0 33.1* 36.0  MCV 98.0  --  99.7  --   PLT 335  --  387  --    Basic Metabolic Panel  Recent Labs Lab 12/11/14 2333 12/11/14 2350 12/12/14 0330 12/12/14 0348  NA 131* 135* 135* 134*  K 3.7 3.7 4.4 4.1  CL 90* 92* 92* 95*  CO2 28  --  28  --   GLUCOSE 275* 287* 271* 306*  BUN 21 25* 23 24*  CREATININE 2.90* 3.00* 2.91* 3.20*  CALCIUM 8.8  --  9.2  --     Filed Vitals:   12/12/14 0607 12/12/14 0615 12/12/14 0630 12/12/14 0645  BP:  145/61 158/53 140/61  Pulse:  70 71 69  Temp:      TempSrc:       Resp:  18 18 21   Height:      Weight:      SpO2: 100% 100% 100% 100%   Exam Alert, eyes open, no verbal response, gesturing to drink something No rash, cyanosis or gangrene Sclera anicteric, throat clear No jvd Chest bibasilar rales, no wheeze RRR no MRG Abd obese, soft, NTND, +BS 1+ edema pretib bilat Pressure discoloration of both post heels, small thin area of skin breakdown sacral region Neuro is deaf and blind  HD: [facility] 4h 80min 400/800 79kg 2/2.5 [place] L[facility] AVF Heparin 9000 Aranesp 40/wk, Venofer 100/wk   Hb 9.1, pth 202 Yesterday post HD was 79.3kg   Assessment: 1. LOC episode / possible arrest - occurred at SNF, AED did not recognize VT/VF, got ~6 min CPR. Had run of NSVT after arrival here without arrest.  Also has pulm edema requiring Bipap this am.  Bipap was removed 1 hour ago due to agitation. 2. ESRD on HD 3. Pulm edema - due to volume overload, she was here recently w similar issues 4. CAD / hx MI / ischemic CM EF 35% 5. DM2  6. Anemia of CKD cont aranesp, IV Fe 7. Psych - hx of paranoid behavior 8. HTN/ volume - ^ vol as above.  On clon/ hydral / lisinopril / MTP at home 9. Sec HPT - cont binder  Plan - HD today, will do asap.  UF should improve her resp status significantly.  Prob will need additional extra HD to get her down to dry wt and possibly needs to be lower than current dry wt. This is second admit now for pulm edema and vol overload.    Kelly Splinter MD (pgr) [phone]    [phone] 12/12/2014, 9:00 AM

## 2014-12-12 NOTE — ED Notes (Signed)
The pt retujrned from c-t

## 2014-12-12 NOTE — Progress Notes (Signed)
DAILY PROGRESS NOTE  Subjective:  Sustained monomorphic VT overnight - this was likely the cause of her arrest or near arrest. No shock delivered, seems to have spontaneously converted. Now on IV amiodarone.  Objective:  Temp:  [97 F (36.1 C)-98 F (36.7 C)] 97 F (36.1 C) (12/19 0545) Pulse Rate:  [65-147] 69 (12/19 0645) Resp:  [13-28] 21 (12/19 0645) BP: (107-218)/(43-100) 140/61 mmHg (12/19 0645) SpO2:  [90 %-100 %] 100 % (12/19 0645) FiO2 (%):  [30 %-50 %] 30 % (12/19 0607) Weight:  [185 lb 13.6 oz (84.3 kg)] 185 lb 13.6 oz (84.3 kg) (12/19 0230) Weight change:   Intake/Output from previous day: 12/18 0701 - 12/19 0700 In: 1706.9 [I.V.:1656.9; IV Piggyback:50] Out: 180 [Urine:180]  Intake/Output from this shift:    Medications: Current Facility-Administered Medications  Medication Dose Route Frequency Provider Last Rate Last Dose  . 0.9 %  sodium chloride infusion   Intravenous Continuous Berle Mull, MD 10 mL/hr at 12/12/14 0445    . acetaminophen (TYLENOL) tablet 650 mg  650 mg Oral Q6H PRN Berle Mull, MD       Or  . acetaminophen (TYLENOL) suppository 650 mg  650 mg Rectal Q6H PRN Berle Mull, MD      . albuterol (PROVENTIL) (2.5 MG/3ML) 0.083% nebulizer solution 2.5 mg  2.5 mg Nebulization Q4H PRN Berle Mull, MD      . amiodarone (NEXTERONE PREMIX) 360 MG/200ML (1.8 mg/mL) IV infusion  60 mg/hr Intravenous Continuous Berle Mull, MD 33.3 mL/hr at 12/12/14 0629 60 mg/hr at 12/12/14 4431   Followed by  . amiodarone (NEXTERONE PREMIX) 360 MG/200ML (1.8 mg/mL) IV infusion  30 mg/hr Intravenous Continuous Berle Mull, MD      . antiseptic oral rinse (CPC / CETYLPYRIDINIUM CHLORIDE 0.05%) solution 7 mL  7 mL Mouth Rinse q12n4p Berle Mull, MD      . aspirin chewable tablet 81 mg  81 mg Oral Daily Berle Mull, MD      . atorvastatin (LIPITOR) tablet 10 mg  10 mg Oral Daily Berle Mull, MD      . bisacodyl (DULCOLAX) suppository 10 mg  10 mg Rectal Daily  PRN Berle Mull, MD      . calcium acetate (PHOSLO) capsule 667 mg  667 mg Oral TID WC Berle Mull, MD      . Derrill Memo ON 12/13/2014] cefTRIAXone (ROCEPHIN) 1 g in dextrose 5 % 50 mL IVPB - Premix  1 g Intravenous Q24H Berle Mull, MD      . chlorhexidine (PERIDEX) 0.12 % solution 15 mL  15 mL Mouth Rinse BID Berle Mull, MD      . cloNIDine (CATAPRES - Dosed in mg/24 hr) patch 0.1 mg  0.1 mg Transdermal Weekly Berle Mull, MD      . clopidogrel (PLAVIX) tablet 75 mg  75 mg Oral Daily Berle Mull, MD      . heparin injection 5,000 Units  5,000 Units Subcutaneous 3 times per day Berle Mull, MD   5,000 Units at 12/12/14 0529  . hydrALAZINE (APRESOLINE) tablet 100 mg  100 mg Oral 3 times per day Berle Mull, MD   100 mg at 12/12/14 0519  . insulin aspart (novoLOG) injection 0-15 Units  0-15 Units Subcutaneous Q6H Berle Mull, MD   8 Units at 12/12/14 0518  . isosorbide mononitrate (IMDUR) 24 hr tablet 60 mg  60 mg Oral Daily Berle Mull, MD      . levalbuterol [facility]) nebulizer solution 0.63 mg  0.63 mg Nebulization Q2H PRN Berle Mull, MD      . lisinopril (PRINIVIL,ZESTRIL) tablet 2.5 mg  2.5 mg Oral Daily Berle Mull, MD      . metoprolol succinate (TOPROL-XL) 24 hr tablet 50 mg  50 mg Oral Daily Berle Mull, MD      . nitroGLYCERIN (NITROSTAT) SL tablet 0.4 mg  0.4 mg Sublingual Q5 min PRN Berle Mull, MD      . nitroGLYCERIN 50 mg in dextrose 5 % 250 mL (0.2 mg/mL) infusion  0-200 mcg/min Intravenous Titrated Berle Mull, MD 9 mL/hr at 12/12/14 0628 30 mcg/min at 12/12/14 9528  . ondansetron (ZOFRAN) tablet 4 mg  4 mg Oral Q6H PRN Berle Mull, MD       Or  . ondansetron (ZOFRAN) injection 4 mg  4 mg Intravenous Q6H PRN Berle Mull, MD      . promethazine (PHENERGAN) suppository 12.5 mg  12.5 mg Rectal Q4H PRN Berle Mull, MD      . Derrill Memo ON 12/13/2014] risperiDONE microspheres (RISPERDAL CONSTA) injection 25 mg  25 mg Intramuscular Q14 Days Berle Mull, MD      . sodium  chloride 0.9 % injection 3 mL  3 mL Intravenous Q12H Berle Mull, MD   3 mL at 12/12/14 0315    Physical Exam: General appearance: alert and moaning, blind and deaf Neck: no carotid bruit and no JVD Lungs: clear to auscultation bilaterally Heart: regular rate and rhythm Abdomen: soft, non-tender; bowel sounds normal; no masses,  no organomegaly and obese Extremities: extremities normal, atraumatic, no cyanosis or edema and stage 1 decubiti on the heels bilaterally Pulses: 2+ and symmetric Skin: Skin color, texture, turgor normal. No rashes or lesions Neurologic: Mental status: Difficult to assess mental status, but eyes open, not responding normally to commands  Lab Results: Results for orders placed or performed during the hospital encounter of 12/11/14 (from the past 48 hour(s))  CBC with Differential     Status: Abnormal   Collection Time: 12/11/14 11:33 PM  Result Value Ref Range   WBC 7.7 4.0 - 10.5 K/uL   RBC 3.06 (L) 3.87 - 5.11 MIL/uL   Hemoglobin 9.6 (L) 12.0 - 15.0 g/dL   HCT 30.0 (L) 36.0 - 46.0 %   MCV 98.0 78.0 - 100.0 fL   MCH 31.4 26.0 - 34.0 pg   MCHC 32.0 30.0 - 36.0 g/dL   RDW 14.3 11.5 - 15.5 %   Platelets 335 150 - 400 K/uL   Neutrophils Relative % 70 43 - 77 %   Neutro Abs 5.4 1.7 - 7.7 K/uL   Lymphocytes Relative 17 12 - 46 %   Lymphs Abs 1.3 0.7 - 4.0 K/uL   Monocytes Relative 11 3 - 12 %   Monocytes Absolute 0.8 0.1 - 1.0 K/uL   Eosinophils Relative 1 0 - 5 %   Eosinophils Absolute 0.1 0.0 - 0.7 K/uL   Basophils Relative 1 0 - 1 %   Basophils Absolute 0.1 0.0 - 0.1 K/uL  Comprehensive metabolic panel     Status: Abnormal   Collection Time: 12/11/14 11:33 PM  Result Value Ref Range   Sodium 131 (L) 137 - 147 mEq/L   Potassium 3.7 3.7 - 5.3 mEq/L   Chloride 90 (L) 96 - 112 mEq/L   CO2 28 19 - 32 mEq/L   Glucose, Bld 275 (H) 70 - 99 mg/dL   BUN 21 6 - 23 mg/dL   Creatinine, Ser 2.90 (H) 0.50 - 1.10  mg/dL   Calcium 8.8 8.4 - 10.5 mg/dL   Total  Protein 7.5 6.0 - 8.3 g/dL   Albumin 3.1 (L) 3.5 - 5.2 g/dL   AST 79 (H) 0 - 37 U/L   ALT 68 (H) 0 - 35 U/L   Alkaline Phosphatase 82 39 - 117 U/L   Total Bilirubin 0.3 0.3 - 1.2 mg/dL   GFR calc non Af Amer 17 (L) >90 mL/min   GFR calc Af Amer 19 (L) >90 mL/min    Comment: (NOTE) The eGFR has been calculated using the CKD EPI equation. This calculation has not been validated in all clinical situations. eGFR's persistently <90 mL/min signify possible Chronic Kidney Disease.    Anion gap 13 5 - 15  Lipase, blood     Status: None   Collection Time: 12/11/14 11:33 PM  Result Value Ref Range   Lipase 50 11 - 59 U/L  Magnesium     Status: None   Collection Time: 12/11/14 11:33 PM  Result Value Ref Range   Magnesium 2.1 1.5 - 2.5 mg/dL  I-stat troponin, ED     Status: Abnormal   Collection Time: 12/11/14 11:48 PM  Result Value Ref Range   Troponin i, poc 0.12 (HH) 0.00 - 0.08 ng/mL   Comment NOTIFIED PHYSICIAN    Comment 3            Comment: Due to the release kinetics of cTnI, a negative result within the first hours of the onset of symptoms does not rule out myocardial infarction with certainty. If myocardial infarction is still suspected, repeat the test at appropriate intervals.   I-Stat CG4 Lactic Acid, ED     Status: Abnormal   Collection Time: 12/11/14 11:50 PM  Result Value Ref Range   Lactic Acid, Venous 2.64 (H) 0.5 - 2.2 mmol/L  I-stat chem 8, ed     Status: Abnormal   Collection Time: 12/11/14 11:50 PM  Result Value Ref Range   Sodium 135 (L) 137 - 147 mEq/L   Potassium 3.7 3.7 - 5.3 mEq/L   Chloride 92 (L) 96 - 112 mEq/L   BUN 25 (H) 6 - 23 mg/dL   Creatinine, Ser 3.00 (H) 0.50 - 1.10 mg/dL   Glucose, Bld 287 (H) 70 - 99 mg/dL   Calcium, Ion 1.01 (L) 1.12 - 1.23 mmol/L   TCO2 26 0 - 100 mmol/L   Hemoglobin 12.2 12.0 - 15.0 g/dL   HCT 36.0 36.0 - 46.0 %  Urinalysis, Routine w reflex microscopic     Status: Abnormal   Collection Time: 12/12/14 12:49 AM    Result Value Ref Range   Color, Urine ORANGE (A) YELLOW    Comment: BIOCHEMICALS MAY BE AFFECTED BY COLOR   APPearance CLOUDY (A) CLEAR   Specific Gravity, Urine 1.023 1.005 - 1.030   pH 5.0 5.0 - 8.0   Glucose, UA NEGATIVE NEGATIVE mg/dL   Hgb urine dipstick NEGATIVE NEGATIVE   Bilirubin Urine MODERATE (A) NEGATIVE   Ketones, ur 15 (A) NEGATIVE mg/dL   Protein, ur 100 (A) NEGATIVE mg/dL   Urobilinogen, UA 0.2 0.0 - 1.0 mg/dL   Nitrite POSITIVE (A) NEGATIVE   Leukocytes, UA SMALL (A) NEGATIVE  Urine microscopic-add on     Status: Abnormal   Collection Time: 12/12/14 12:49 AM  Result Value Ref Range   Squamous Epithelial / LPF FEW (A) RARE   WBC, UA 7-10 <3 WBC/hpf   RBC / HPF 0-2 <3 RBC/hpf   Bacteria,  UA FEW (A) RARE   Casts HYALINE CASTS (A) NEGATIVE  Pro b natriuretic peptide     Status: Abnormal   Collection Time: 12/12/14  3:14 AM  Result Value Ref Range   Pro B Natriuretic peptide (BNP) 24864.0 (H) 0 - 125 pg/mL  Protime-INR     Status: None   Collection Time: 12/12/14  3:14 AM  Result Value Ref Range   Prothrombin Time 13.9 11.6 - 15.2 seconds   INR 1.06 0.00 - 1.49  APTT     Status: None   Collection Time: 12/12/14  3:14 AM  Result Value Ref Range   aPTT 31 24 - 37 seconds  TSH     Status: None   Collection Time: 12/12/14  3:30 AM  Result Value Ref Range   TSH 1.950 0.350 - 4.500 uIU/mL  CBC with Differential     Status: Abnormal   Collection Time: 12/12/14  3:30 AM  Result Value Ref Range   WBC 12.4 (H) 4.0 - 10.5 K/uL   RBC 3.32 (L) 3.87 - 5.11 MIL/uL   Hemoglobin 10.8 (L) 12.0 - 15.0 g/dL   HCT 33.1 (L) 36.0 - 46.0 %   MCV 99.7 78.0 - 100.0 fL   MCH 32.5 26.0 - 34.0 pg   MCHC 32.6 30.0 - 36.0 g/dL   RDW 14.4 11.5 - 15.5 %   Platelets 387 150 - 400 K/uL   Neutrophils Relative % 81 (H) 43 - 77 %   Neutro Abs 10.0 (H) 1.7 - 7.7 K/uL   Lymphocytes Relative 11 (L) 12 - 46 %   Lymphs Abs 1.3 0.7 - 4.0 K/uL   Monocytes Relative 8 3 - 12 %   Monocytes  Absolute 1.0 0.1 - 1.0 K/uL   Eosinophils Relative 0 0 - 5 %   Eosinophils Absolute 0.0 0.0 - 0.7 K/uL   Basophils Relative 0 0 - 1 %   Basophils Absolute 0.0 0.0 - 0.1 K/uL  Comprehensive metabolic panel     Status: Abnormal   Collection Time: 12/12/14  3:30 AM  Result Value Ref Range   Sodium 135 (L) 137 - 147 mEq/L   Potassium 4.4 3.7 - 5.3 mEq/L   Chloride 92 (L) 96 - 112 mEq/L   CO2 28 19 - 32 mEq/L   Glucose, Bld 271 (H) 70 - 99 mg/dL   BUN 23 6 - 23 mg/dL   Creatinine, Ser 2.91 (H) 0.50 - 1.10 mg/dL   Calcium 9.2 8.4 - 10.5 mg/dL   Total Protein 8.4 (H) 6.0 - 8.3 g/dL   Albumin 3.6 3.5 - 5.2 g/dL   AST 65 (H) 0 - 37 U/L   ALT 67 (H) 0 - 35 U/L   Alkaline Phosphatase 93 39 - 117 U/L   Total Bilirubin 0.3 0.3 - 1.2 mg/dL   GFR calc non Af Amer 17 (L) >90 mL/min   GFR calc Af Amer 19 (L) >90 mL/min    Comment: (NOTE) The eGFR has been calculated using the CKD EPI equation. This calculation has not been validated in all clinical situations. eGFR's persistently <90 mL/min signify possible Chronic Kidney Disease.    Anion gap 15 5 - 15  Troponin I     Status: None   Collection Time: 12/12/14  3:30 AM  Result Value Ref Range   Troponin I <0.30 <0.30 ng/mL    Comment:        Due to the release kinetics of cTnI, a negative result within the  first hours of the onset of symptoms does not rule out myocardial infarction with certainty. If myocardial infarction is still suspected, repeat the test at appropriate intervals.    CK total and CKMB (cardiac)     Status: Abnormal   Collection Time: 12/12/14  3:30 AM  Result Value Ref Range   Total CK 108 7 - 177 U/L   CK, MB 3.5 0.3 - 4.0 ng/mL   Relative Index 3.2 (H) 0.0 - 2.5  I-STAT 3, arterial blood gas (G3+)     Status: Abnormal   Collection Time: 12/12/14  3:46 AM  Result Value Ref Range   pH, Arterial 7.297 (L) 7.350 - 7.450   pCO2 arterial 59.3 (HH) 35.0 - 45.0 mmHg   pO2, Arterial 58.0 (L) 80.0 - 100.0 mmHg    Bicarbonate 29.0 (H) 20.0 - 24.0 mEq/L   TCO2 31 0 - 100 mmol/L   O2 Saturation 86.0 %   Acid-Base Excess 1.0 0.0 - 2.0 mmol/L   Patient temperature 98.8 F    Collection site RADIAL, ALLEN'S TEST ACCEPTABLE    Drawn by RT    Sample type ARTERIAL    Comment NOTIFIED PHYSICIAN   Glucose, capillary     Status: Abnormal   Collection Time: 12/12/14  3:47 AM  Result Value Ref Range   Glucose-Capillary 280 (H) 70 - 99 mg/dL  I-STAT, chem 8     Status: Abnormal   Collection Time: 12/12/14  3:48 AM  Result Value Ref Range   Sodium 134 (L) 137 - 147 mEq/L   Potassium 4.1 3.7 - 5.3 mEq/L   Chloride 95 (L) 96 - 112 mEq/L   BUN 24 (H) 6 - 23 mg/dL   Creatinine, Ser 3.20 (H) 0.50 - 1.10 mg/dL   Glucose, Bld 306 (H) 70 - 99 mg/dL   Calcium, Ion 1.14 1.12 - 1.23 mmol/L   TCO2 26 0 - 100 mmol/L   Hemoglobin 12.2 12.0 - 15.0 g/dL   HCT 36.0 36.0 - 46.0 %  I-STAT 3, arterial blood gas (G3+)     Status: Abnormal   Collection Time: 12/12/14  6:06 AM  Result Value Ref Range   pH, Arterial 7.386 7.350 - 7.450   pCO2 arterial 45.3 (H) 35.0 - 45.0 mmHg   pO2, Arterial 204.0 (H) 80.0 - 100.0 mmHg   Bicarbonate 27.4 (H) 20.0 - 24.0 mEq/L   TCO2 29 0 - 100 mmol/L   O2 Saturation 100.0 %   Acid-Base Excess 2.0 0.0 - 2.0 mmol/L   Patient temperature 97.0 F    Collection site RADIAL, ALLEN'S TEST ACCEPTABLE    Drawn by RT    Sample type ARTERIAL     Imaging: Ct Head Wo Contrast  12/12/2014   CLINICAL DATA:  Acute onset of altered mental status. Status post last fall with asystole, status post CPR and resuscitation. Initial encounter. Concern for head or cervical spine injury.  EXAM: CT HEAD WITHOUT CONTRAST  CT CERVICAL SPINE WITHOUT CONTRAST  TECHNIQUE: Multidetector CT imaging of the head and cervical spine was performed following the standard protocol without intravenous contrast. Multiplanar CT image reconstructions of the cervical spine were also generated.  COMPARISON:  None.  FINDINGS: CT HEAD  FINDINGS  There is no evidence of acute infarction, mass lesion, or intra- or extra-axial hemorrhage on CT.  Prominence of the ventricles and sulci reflects mild cortical volume loss. Scattered periventricular and subcortical white matter change likely reflects small vessel ischemic microangiopathy.  The brainstem and fourth  ventricle are within normal limits. The basal ganglia are unremarkable in appearance. The cerebral hemispheres demonstrate grossly normal gray-white differentiation. No mass effect or midline shift is seen.  There is no evidence of fracture; visualized osseous structures are unremarkable in appearance. The visualized portions of the orbits are within normal limits. The paranasal sinuses and mastoid air cells are well-aerated. No significant soft tissue abnormalities are seen.  CT CERVICAL SPINE FINDINGS  There is no evidence of fracture or subluxation. Vertebral bodies demonstrate normal height and alignment. There is mild narrowing of the intervertebral disc spaces at C3-C4 and C4-C5, with associated anterior and posterior disc osteophyte complexes. Prevertebral soft tissues are within normal limits.  A 0.9 cm hypodensity is noted at the left thyroid lobe. There is mild interstitial prominence at the lung apices. No significant soft tissue abnormalities are seen.  IMPRESSION: 1. No evidence of traumatic intracranial injury or fracture. 2. No evidence of fracture or subluxation along the cervical spine. 3. Mild cortical volume loss and scattered small vessel ischemic microangiopathy. 4. Minimal degenerative change at the upper cervical spine. 5. Sub-centimeter thyroid nodule(s) noted, too small to characterize, but most likely benign in the absence of known clinical risk factors for thyroid carcinoma. 6. Mild interstitial prominence at the lung apices.   Electronically Signed   By: Garald Balding M.D.   On: 12/12/2014 00:02   Ct Angio Chest Pe W/cm &/or Wo Cm  12/12/2014   CLINICAL DATA:   Acute onset of chest pain and shortness of breath. Patient was unresponsive, with asystole, status post cardiac resuscitation. Initial encounter.  EXAM: CT ANGIOGRAPHY CHEST WITH CONTRAST  TECHNIQUE: Multidetector CT imaging of the chest was performed using the standard protocol during bolus administration of intravenous contrast. Multiplanar CT image reconstructions and MIPs were obtained to evaluate the vascular anatomy.  CONTRAST:  71m OMNIPAQUE IOHEXOL 350 MG/ML SOLN  COMPARISON:  Chest radiograph performed 12/11/2014  FINDINGS: There is no evidence of pulmonary embolus.  Mild peripheral nodular densities are thought to reflect atelectasis. There is slight interstitial prominence, without definite pulmonary edema. There is no evidence of significant focal consolidation, pleural effusion or pneumothorax. No suspicious masses are identified; no abnormal focal contrast enhancement is seen.  There is mild diffuse peribronchial thickening, particularly at the hila. No definite mediastinal lymphadenopathy is seen. No pericardial effusion is identified. The great vessels are grossly unremarkable in appearance. No axillary lymphadenopathy is seen. Multiple hypodensities are seen within the thyroid gland, measuring up to 1.4 cm in size, with minimal calcification.  The visualized portions of the liver and spleen are unremarkable.  No acute osseous abnormalities are seen.  Review of the MIP images confirms the above findings.  IMPRESSION: 1. No evidence of pulmonary embolus. 2. Slight interstitial prominence noted, without definite pulmonary edema. Mild peripheral nodular densities are thought to reflect atelectasis. Lungs otherwise grossly clear. 3. Mild diffuse peribronchial thickening noted, nonspecific in appearance. 4. Multiple hypodensities within the thyroid gland measuring up to 1.4 cm in size. Consider further evaluation with thyroid ultrasound. If patient is clinically hyperthyroid, consider nuclear medicine  thyroid uptake and scan.   Electronically Signed   By: JGarald BaldingM.D.   On: 12/12/2014 01:51   Ct Cervical Spine Wo Contrast  12/12/2014   CLINICAL DATA:  Acute onset of altered mental status. Status post last fall with asystole, status post CPR and resuscitation. Initial encounter. Concern for head or cervical spine injury.  EXAM: CT HEAD WITHOUT CONTRAST  CT CERVICAL SPINE  WITHOUT CONTRAST  TECHNIQUE: Multidetector CT imaging of the head and cervical spine was performed following the standard protocol without intravenous contrast. Multiplanar CT image reconstructions of the cervical spine were also generated.  COMPARISON:  None.  FINDINGS: CT HEAD FINDINGS  There is no evidence of acute infarction, mass lesion, or intra- or extra-axial hemorrhage on CT.  Prominence of the ventricles and sulci reflects mild cortical volume loss. Scattered periventricular and subcortical white matter change likely reflects small vessel ischemic microangiopathy.  The brainstem and fourth ventricle are within normal limits. The basal ganglia are unremarkable in appearance. The cerebral hemispheres demonstrate grossly normal gray-white differentiation. No mass effect or midline shift is seen.  There is no evidence of fracture; visualized osseous structures are unremarkable in appearance. The visualized portions of the orbits are within normal limits. The paranasal sinuses and mastoid air cells are well-aerated. No significant soft tissue abnormalities are seen.  CT CERVICAL SPINE FINDINGS  There is no evidence of fracture or subluxation. Vertebral bodies demonstrate normal height and alignment. There is mild narrowing of the intervertebral disc spaces at C3-C4 and C4-C5, with associated anterior and posterior disc osteophyte complexes. Prevertebral soft tissues are within normal limits.  A 0.9 cm hypodensity is noted at the left thyroid lobe. There is mild interstitial prominence at the lung apices. No significant soft tissue  abnormalities are seen.  IMPRESSION: 1. No evidence of traumatic intracranial injury or fracture. 2. No evidence of fracture or subluxation along the cervical spine. 3. Mild cortical volume loss and scattered small vessel ischemic microangiopathy. 4. Minimal degenerative change at the upper cervical spine. 5. Sub-centimeter thyroid nodule(s) noted, too small to characterize, but most likely benign in the absence of known clinical risk factors for thyroid carcinoma. 6. Mild interstitial prominence at the lung apices.   Electronically Signed   By: Garald Balding M.D.   On: 12/12/2014 00:02   Dg Chest Port 1 View  12/12/2014   CLINICAL DATA:  Acute onset of shortness of breath. Assess for flash pulmonary edema.  EXAM: PORTABLE CHEST - 1 VIEW  COMPARISON:  Chest radiograph from 12/11/2014  FINDINGS: The lungs are well-aerated. Mild right-sided airspace opacities could reflect mildly asymmetric interstitial edema. There is trace fluid along the right minor fissure. No pneumothorax is identified.  The cardiomediastinal silhouette is borderline normal in size. An external pacing pad is noted overlying the left hemithorax. No acute osseous abnormalities are seen.  IMPRESSION: Mild right-sided airspace opacities could reflect mildly asymmetric interstitial edema. Trace fluid along the right minor fissure.   Electronically Signed   By: Garald Balding M.D.   On: 12/12/2014 04:55   Dg Chest Port 1 View  12/12/2014   CLINICAL DATA:  Status post cardiac arrest and resuscitation. Initial encounter.  EXAM: PORTABLE CHEST - 1 VIEW  COMPARISON:  Chest radiograph from 01/02/2014  FINDINGS: There is elevation of the right hemidiaphragm. There is no evidence of focal opacification, pleural effusion or pneumothorax.  The cardiomediastinal silhouette is borderline normal in size. No acute osseous abnormalities are seen. A small left cervical rib is incidentally noted.  IMPRESSION: 1. Elevation of the right hemidiaphragm; lungs  remain grossly clear. 2. Small left cervical rib incidentally noted.   Electronically Signed   By: Garald Balding M.D.   On: 12/12/2014 00:04    Assessment:  1. Principal Problem: 2.   Cardiac arrest 3. Active Problems: 4.   Essential hypertension 5.   Diabetes mellitus with ESRD (end-stage renal disease) 6.  NSVT (nonsustained ventricular tachycardia) 7.   Chronic diastolic CHF (congestive heart failure) 8.   Cardiomyopathy, ischemic-EF 30-35% 9.   Blind 10.   Unresponsive episode 11.   Plan:  1. Continue IV amiodarone today. Try to advance diet today. May be able to switch to po amiodarone tomorrow. Once tolerating po medications, could consider titrating up HF medications as bp is elevated. Given her comorbidities, ESRD on HD and poor life expectancy, she is not a candidate for AICD. Would recommend discussion with the family about DNR status - likely to have recurrent VT in the future.  Will review echo today.  Time Spent Directly with Patient:  15 minutes  Length of Stay:  LOS: 1 day   Pixie Casino, MD, [facility] Attending Cardiologist [facility]  Lofton Leon C 12/12/2014, 7:59 AM

## 2014-12-12 NOTE — ED Notes (Signed)
EKG GIVEN TO DR Claudine Mouton

## 2014-12-12 NOTE — Progress Notes (Signed)
INITIAL NUTRITION ASSESSMENT  DOCUMENTATION CODES Per approved criteria  -Not Applicable   INTERVENTION: Once pt is able to eat, RD to provide oral supplements if po intake becomes poor.  NUTRITION DIAGNOSIS: Increased nutrient needs related to chronic illness, ESRD  as evidenced by estimated nutrition needs.   Goal: Pt to meet >/= 90% of their estimated nutrition needs   Monitor:  PO intake, weight trends, labs, I/O's  Reason for Assessment: MST  58 y.o. female  Admitting Dx: Cardiac arrest  ASSESSMENT: with Past medical history of ESRD on hemodialysis, dyslipidemia, hypertension, recent admission for anterior wall MI medically managed, diabetes mellitus, deaf and blind since childhood. The patient was brought in from the nursing home. The husband apparently her sound and found the patient unconscious.  Interpreter, Lavella Lemons, at bedside. Family reports pt currently has an altered mental status. Unable to obtain nutrition hx as the family member reports she does not live close by her and is unsure of her nutrition. Unable to perform a nutrition focused physical exam at this time. Pt currently with Bipap on and is unable to eat.   Labs: Low sodium, chloride. High BUN, creatinine. CBG's 135-306 mg/dL.  Height: Ht Readings from Last 1 Encounters:  12/12/14 5\' 6"  (1.676 m)    Weight: Wt Readings from Last 1 Encounters:  12/12/14 185 lb 3 oz (84 kg)    Ideal Body Weight: 130 lbs  % Ideal Body Weight: 142%  Wt Readings from Last 10 Encounters:  12/12/14 185 lb 3 oz (84 kg)  12/02/14 178 lb (80.74 kg)  12/01/14 178 lb (80.74 kg)  11/26/14 171 lb 15.3 oz (78 kg)  10/21/14 180 lb (81.647 kg)  04/17/14 167 lb (75.751 kg)  03/17/14 185 lb (83.915 kg)  02/10/14 209 lb (94.802 kg)  01/12/14 209 lb (94.802 kg)  01/08/14 209 lb 3.5 oz (94.9 kg)    Usual Body Weight: unable to obtain  % Usual Body Weight: ---  BMI:  Body mass index is 29.9 kg/(m^2).  Estimated  Nutritional Needs: Kcal: 1900-2100 Protein: 105-115 grams Fluid: Per MD  Skin: Stage I pressure ulcer on heel, stage II pressure ulcer on buttocks, +1 generalized edema  Diet Order: Diet clear liquid  EDUCATION NEEDS: -Education not appropriate at this time   Intake/Output Summary (Last 24 hours) at 12/12/14 1712 Last data filed at 12/12/14 1600  Gross per 24 hour  Intake 2228.01 ml  Output   3987 ml  Net -1758.99 ml    Last BM: PTA  Labs:   Recent Labs Lab 12/11/14 2333 12/11/14 2350 12/12/14 0330 12/12/14 0348  NA 131* 135* 135* 134*  K 3.7 3.7 4.4 4.1  CL 90* 92* 92* 95*  CO2 28  --  28  --   BUN 21 25* 23 24*  CREATININE 2.90* 3.00* 2.91* 3.20*  CALCIUM 8.8  --  9.2  --   MG 2.1  --   --   --   GLUCOSE 275* 287* 271* 306*    CBG (last 3)   Recent Labs  12/12/14 0347 12/12/14 0914 12/12/14 1215  GLUCAP 280* 294* 135*    Scheduled Meds: . antiseptic oral rinse  7 mL Mouth Rinse q12n4p  . aspirin  81 mg Oral Daily  . atorvastatin  10 mg Oral Daily  . calcium acetate  667 mg Oral TID WC  . [START ON 12/13/2014] cefTRIAXone (ROCEPHIN)  IV  1 g Intravenous Q24H  . chlorhexidine  15 mL Mouth Rinse BID  .  cloNIDine  0.1 mg Transdermal Weekly  . clopidogrel  75 mg Oral Daily  . [START ON 12/16/2014] darbepoetin (ARANESP) injection - DIALYSIS  40 mcg Intravenous Q Wed-HD  . [START ON 12/16/2014] ferric gluconate (FERRLECIT/NULECIT) IV  125 mg Intravenous Q Wed-HD  . heparin  5,000 Units Subcutaneous 3 times per day  . hydrALAZINE  100 mg Oral 3 times per day  . insulin aspart  0-15 Units Subcutaneous Q6H  . isosorbide mononitrate  60 mg Oral Daily  . lisinopril  2.5 mg Oral Daily  . metoprolol succinate  50 mg Oral Daily  . [START ON 12/13/2014] risperiDONE microspheres  25 mg Intramuscular Q14 Days  . sodium chloride  3 mL Intravenous Q12H    Continuous Infusions: . sodium chloride 10 mL/hr at 12/12/14 0445  . amiodarone 30 mg/hr (12/12/14 1613)   . nitroGLYCERIN 30 mcg/min (12/12/14 1600)    Past Medical History  Diagnosis Date  . Hyperlipidemia   . Hypertension     a. Has previously refused blood pressure meds.   . Deaf     Divorced from husband but lives with him. Has daughter but she does not care for her.  . Bilateral leg edema     a. Chronic.  Marland Kitchen Psychiatric disorder     She frequently exhibits paranoia and has been diagnosed with psychotic d/o NOS during hospital stay in the past. She is tangetial and perseverative during her visits. She apparently has had a bad experience with mental health in [place] in the past and refuses to discuss mental issues for fear that she will be sent back there. Her paranoia, communication issues, financial woes and lack of fam  . Fibroid uterus   . Anemia     Due to fibroids.  . Heart murmur   . Diabetes mellitus     a. Per PCP note 2012 (A1C 9.2) - pt unwilling to take meds and was educated on risk of uncontrolled DM.  b. A1C 5.9 in 11/2013.  Marland Kitchen Renal disorder   . Coronary artery disease   . MI (myocardial infarction)   . Renal insufficiency     Past Surgical History  Procedure Laterality Date  . No past surgeries    . Insertion of dialysis catheter Right 01/02/2014    Procedure: INSERTION OF DIALYSIS CATHETER;  Surgeon: Rosetta Posner, MD;  Location: [facility];  Service: Vascular;  Laterality: Right;  . Av fistula placement Left 01/02/2014    Procedure: ARTERIOVENOUS (AV) FISTULA CREATION;  Surgeon: Rosetta Posner, MD;  Location: [facility];  Service: Vascular;  Laterality: Left;    Kallie Locks, MS, RD, LDN Pager # [phone] After hours/ weekend pager # [phone]

## 2014-12-12 NOTE — ED Notes (Signed)
Rep;ort given to rn on 2h 

## 2014-12-12 NOTE — Progress Notes (Signed)
PULMONARY / CRITICAL CARE MEDICINE   Name: KYLY BAERT MRN: [id number] DOB: February 14, 1956    ADMISSION DATE:  12/11/2014 CONSULTATION DATE:  12/12/14  REFERRING MD :  Dr. Posey Pronto  CHIEF COMPLAINT:  Shortness of breath, AMS, Vtach  INITIAL PRESENTATION: Initially presented to ED after cardiac arrest requiring CPR with ROSC.  STUDIES:  CTA chest 12/18 - no acute process  SIGNIFICANT EVENTS: Became acutely short of breath and unresponsive after transfer to [place]:  Came off BiPAP this am  VITAL SIGNS: Temp:  [97 F (36.1 C)-98 F (36.7 C)] 97 F (36.1 C) (12/19 0545) Pulse Rate:  [65-147] 69 (12/19 0645) Resp:  [13-28] 21 (12/19 0645) BP: (107-218)/(43-100) 140/61 mmHg (12/19 0645) SpO2:  [90 %-100 %] 100 % (12/19 0645) FiO2 (%):  [30 %-50 %] 30 % (12/19 0607) Weight:  [84.3 kg (185 lb 13.6 oz)] 84.3 kg (185 lb 13.6 oz) (12/19 0230) HEMODYNAMICS:   VENTILATOR SETTINGS: Vent Mode:  [-] BIPAP FiO2 (%):  [30 %-50 %] 30 % Set Rate:  [15 bmp] 15 bmp PEEP:  [6 cmH20] 6 cmH20 Pressure Support:  [6 cmH20] 6 cmH20 INTAKE / OUTPUT:  Intake/Output Summary (Last 24 hours) at 12/12/14 0814 Last data filed at 12/12/14 0600  Gross per 24 hour  Intake 1706.85 ml  Output    180 ml  Net 1526.85 ml    PHYSICAL EXAMINATION: General:  Laying in bed, responds to light touch by opening eyes.  Neuro:  Responds to light touch. Pt is deaf, blind.  HEENT:  PERRL, EOMI, No stridor Cardiovascular:  NRRR, II/VI systolic murmur Lungs:  Diffuse rhonchi, few basilar crackles, diminished  Abdomen:  Soft, NTND, +BS, no HSM Musculoskeletal:  No joint abnormalities, dialysis fistula Right upper arm Skin:  No bruises, rashes or other lesions.   LABS:  CBC  Recent Labs Lab 12/11/14 2333 12/11/14 2350 12/12/14 0330 12/12/14 0348  WBC 7.7  --  12.4*  --   HGB 9.6* 12.2 10.8* 12.2  HCT 30.0* 36.0 33.1* 36.0  PLT 335  --  387  --    Coag's  Recent Labs Lab 12/12/14 0314  APTT  31  INR 1.06   BMET  Recent Labs Lab 12/11/14 2333 12/11/14 2350 12/12/14 0330 12/12/14 0348  NA 131* 135* 135* 134*  K 3.7 3.7 4.4 4.1  CL 90* 92* 92* 95*  CO2 28  --  28  --   BUN 21 25* 23 24*  CREATININE 2.90* 3.00* 2.91* 3.20*  GLUCOSE 275* 287* 271* 306*   Electrolytes  Recent Labs Lab 12/11/14 2333 12/12/14 0330  CALCIUM 8.8 9.2  MG 2.1  --    Sepsis Markers  Recent Labs Lab 12/11/14 2350  LATICACIDVEN 2.64*   ABG  Recent Labs Lab 12/12/14 0346 12/12/14 0606  PHART 7.297* 7.386  PCO2ART 59.3* 45.3*  PO2ART 58.0* 204.0*   Liver Enzymes  Recent Labs Lab 12/11/14 2333 12/12/14 0330  AST 79* 65*  ALT 68* 67*  ALKPHOS 82 93  BILITOT 0.3 0.3  ALBUMIN 3.1* 3.6   Cardiac Enzymes  Recent Labs Lab 12/12/14 0314 12/12/14 0330  TROPONINI  --  <0.30  PROBNP 24864.0*  --    Glucose  Recent Labs Lab 12/12/14 0347  GLUCAP 280*    Imaging Ct Head Wo Contrast  12/12/2014   CLINICAL DATA:  Acute onset of altered mental status. Status post last fall with asystole, status post CPR and resuscitation. Initial encounter. Concern for head or  cervical spine injury.  EXAM: CT HEAD WITHOUT CONTRAST  CT CERVICAL SPINE WITHOUT CONTRAST  TECHNIQUE: Multidetector CT imaging of the head and cervical spine was performed following the standard protocol without intravenous contrast. Multiplanar CT image reconstructions of the cervical spine were also generated.  COMPARISON:  None.  FINDINGS: CT HEAD FINDINGS  There is no evidence of acute infarction, mass lesion, or intra- or extra-axial hemorrhage on CT.  Prominence of the ventricles and sulci reflects mild cortical volume loss. Scattered periventricular and subcortical white matter change likely reflects small vessel ischemic microangiopathy.  The brainstem and fourth ventricle are within normal limits. The basal ganglia are unremarkable in appearance. The cerebral hemispheres demonstrate grossly normal gray-white  differentiation. No mass effect or midline shift is seen.  There is no evidence of fracture; visualized osseous structures are unremarkable in appearance. The visualized portions of the orbits are within normal limits. The paranasal sinuses and mastoid air cells are well-aerated. No significant soft tissue abnormalities are seen.  CT CERVICAL SPINE FINDINGS  There is no evidence of fracture or subluxation. Vertebral bodies demonstrate normal height and alignment. There is mild narrowing of the intervertebral disc spaces at C3-C4 and C4-C5, with associated anterior and posterior disc osteophyte complexes. Prevertebral soft tissues are within normal limits.  A 0.9 cm hypodensity is noted at the left thyroid lobe. There is mild interstitial prominence at the lung apices. No significant soft tissue abnormalities are seen.  IMPRESSION: 1. No evidence of traumatic intracranial injury or fracture. 2. No evidence of fracture or subluxation along the cervical spine. 3. Mild cortical volume loss and scattered small vessel ischemic microangiopathy. 4. Minimal degenerative change at the upper cervical spine. 5. Sub-centimeter thyroid nodule(s) noted, too small to characterize, but most likely benign in the absence of known clinical risk factors for thyroid carcinoma. 6. Mild interstitial prominence at the lung apices.   Electronically Signed   By: Garald Balding M.D.   On: 12/12/2014 00:02   Ct Cervical Spine Wo Contrast  12/12/2014   CLINICAL DATA:  Acute onset of altered mental status. Status post last fall with asystole, status post CPR and resuscitation. Initial encounter. Concern for head or cervical spine injury.  EXAM: CT HEAD WITHOUT CONTRAST  CT CERVICAL SPINE WITHOUT CONTRAST  TECHNIQUE: Multidetector CT imaging of the head and cervical spine was performed following the standard protocol without intravenous contrast. Multiplanar CT image reconstructions of the cervical spine were also generated.  COMPARISON:   None.  FINDINGS: CT HEAD FINDINGS  There is no evidence of acute infarction, mass lesion, or intra- or extra-axial hemorrhage on CT.  Prominence of the ventricles and sulci reflects mild cortical volume loss. Scattered periventricular and subcortical white matter change likely reflects small vessel ischemic microangiopathy.  The brainstem and fourth ventricle are within normal limits. The basal ganglia are unremarkable in appearance. The cerebral hemispheres demonstrate grossly normal gray-white differentiation. No mass effect or midline shift is seen.  There is no evidence of fracture; visualized osseous structures are unremarkable in appearance. The visualized portions of the orbits are within normal limits. The paranasal sinuses and mastoid air cells are well-aerated. No significant soft tissue abnormalities are seen.  CT CERVICAL SPINE FINDINGS  There is no evidence of fracture or subluxation. Vertebral bodies demonstrate normal height and alignment. There is mild narrowing of the intervertebral disc spaces at C3-C4 and C4-C5, with associated anterior and posterior disc osteophyte complexes. Prevertebral soft tissues are within normal limits.  A 0.9 cm hypodensity  is noted at the left thyroid lobe. There is mild interstitial prominence at the lung apices. No significant soft tissue abnormalities are seen.  IMPRESSION: 1. No evidence of traumatic intracranial injury or fracture. 2. No evidence of fracture or subluxation along the cervical spine. 3. Mild cortical volume loss and scattered small vessel ischemic microangiopathy. 4. Minimal degenerative change at the upper cervical spine. 5. Sub-centimeter thyroid nodule(s) noted, too small to characterize, but most likely benign in the absence of known clinical risk factors for thyroid carcinoma. 6. Mild interstitial prominence at the lung apices.   Electronically Signed   By: Garald Balding M.D.   On: 12/12/2014 00:02   Dg Chest Port 1 View  12/12/2014    CLINICAL DATA:  Status post cardiac arrest and resuscitation. Initial encounter.  EXAM: PORTABLE CHEST - 1 VIEW  COMPARISON:  Chest radiograph from 01/02/2014  FINDINGS: There is elevation of the right hemidiaphragm. There is no evidence of focal opacification, pleural effusion or pneumothorax.  The cardiomediastinal silhouette is borderline normal in size. No acute osseous abnormalities are seen. A small left cervical rib is incidentally noted.  IMPRESSION: 1. Elevation of the right hemidiaphragm; lungs remain grossly clear. 2. Small left cervical rib incidentally noted.   Electronically Signed   By: Garald Balding M.D.   On: 12/12/2014 00:04     ASSESSMENT / PLAN:  PULMONARY  A:Flash pulmonary edema  P:   Stabilized with BiPAP UOP at baseline is minimal, will potentially need UF 12/19  CARDIOVASCULAR  A: Vtach,  CHF,  Hypertensive urgency P:  Nitroglycerine gtt for BP, wean as able.  Continue amiodarone gtt Trend troponins Appreciate cardiology input, not a candidate for AICD due to overall medical condition, plan to manage medically Continue home BP meds  RENAL A:  ESRD, dialysis dependent P:   Consult renal 12/19, will need to decide whether to dialyze vs wait until 12/20  GASTROINTESTINAL A:  No acute issues P:   Start diet 12/19 if tolerates off BiPAP  INFECTIOUS A:  UTI P:   BCx2 12/11/14 UC 12/11/14  Abx: Ceftriaxone, start date 12/12/14, day 1/-  ENDOCRINE A:  DM2   P:   SSI for now, will likely need basal bolus regimen  NEUROLOGIC A:  Deaf and mute, also partially blind P:   Communicates through sign language   FAMILY  - Updates: Son is power of attorney as was updated by phone of change in pt status 12/19 am by Dr Lincoln Maxin. Pt is full code.    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  Now with flash pulmonary edema 2/2  hypertension. Plan attempt off BiPAP 12/19 am. Discuss case w renal and cardiology    35 minutes independent CC time   Baltazar Apo, MD, PhD 12/12/2014, 8:21 AM [facility] [phone] or if no answer [phone]

## 2014-12-12 NOTE — ED Notes (Signed)
The pt is c/o being cold  Warm blankets placed on the pt

## 2014-12-12 NOTE — Consult Note (Signed)
PULMONARY / CRITICAL CARE MEDICINE   Name: [name] MRN: [id number] DOB: 1956/07/21    ADMISSION DATE:  12/11/2014 CONSULTATION DATE:  12/12/14  REFERRING MD :  Dr. Posey Pronto  CHIEF COMPLAINT:  Shortness of breath, AMS, Vtach  INITIAL PRESENTATION: Initially presented to ED after cardiac arrest requiring CPR with ROSC.  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  She was brought to the ED after her husband heard her fall and she was found to be pulseless at her nursing facility.  Per ER note an AED was applied but no shock was advised.  EMS gave lidocaine via an IO and after 6 minutes of CPR she had return of spontaneous circulation.  She was alert and responsive (signing with her son) in the ED and was admitted to the hospitalist service.  Shortly after arrival in stepdown she had a prolonged run of Vtach, because hypertensive then short of breath and then less responsive.  She was placed on BiPap, her blood pressure was controlled with a nitroglycerin drip and she was started on amiodarone for her arrythmia.  PCCM was consulted for further management given her medical complexity.   Of note [name] has a history of a missed MI in November of this year with a resultant EF of about 30%.  PAST MEDICAL HISTORY :   has a past medical history of Hyperlipidemia; Hypertension; Deaf; Bilateral leg edema; Psychiatric disorder; Fibroid uterus; Anemia; Heart murmur; Diabetes mellitus; Renal disorder; Coronary artery disease; MI (myocardial infarction); and Renal insufficiency.  has past surgical history that includes No past surgeries; Insertion of dialysis catheter (Right, 01/02/2014); and AV fistula placement (Left, 01/02/2014). Prior to Admission medications   Medication Sig Start Date End Date  Taking? Authorizing Provider  aspirin 81 MG tablet Take 81 mg by mouth daily. For hypertension   Yes Historical Provider, MD  atorvastatin (LIPITOR) 10 MG tablet Take 10 mg by mouth daily.   Yes Historical Provider, MD  bisacodyl (DULCOLAX) 10 MG suppository Place 1 suppository (10 mg total) rectally daily as needed for mild constipation, moderate constipation or severe constipation. 01/08/14  Yes Kelvin Cellar, MD  calcium acetate (PHOSLO) 667 MG capsule Take 1 capsule (667 mg total) by mouth 3 (three) times daily with meals. 01/08/14  Yes Kelvin Cellar, MD  cloNIDine (CATAPRES - DOSED IN MG/24 HR) 0.1 mg/24hr patch Place 1 patch (0.1 mg total) onto the skin once a week. For this week, then off. 11/26/14  Yes Ripudeep K Rai, MD  clopidogrel (PLAVIX) 75 MG tablet Take 1 tablet (75 mg total) by mouth daily. 11/26/14  Yes Ripudeep Krystal Eaton, MD  Docusate Sodium (DSS) 100 MG CAPS Take 100 mg by mouth 2 (two) times daily. For constipation 01/08/14  Yes Kelvin Cellar, MD  hydrALAZINE (APRESOLINE) 100 MG tablet Take 1 tablet (100 mg total) by mouth every 8 (eight) hours. 01/08/14  Yes Kelvin Cellar, MD  isosorbide mononitrate (IMDUR) 60 MG 24 hr tablet Take 1 tablet (60 mg total) by mouth daily. 11/26/14  Yes Ripudeep Krystal Eaton, MD  lactulose (CHRONULAC) 10 GM/15ML solution Take 30 mLs (20 g total) by mouth 2 (two) times daily as needed for moderate constipation. 01/08/14  Yes Kelvin Cellar, MD  levalbuterol Penne Lash) 0.63 MG/3ML nebulizer solution  Take 3 mLs (0.63 mg total) by nebulization every 2 (two) hours as needed for wheezing or shortness of breath. 01/08/14  Yes Kelvin Cellar, MD  lisinopril (PRINIVIL,ZESTRIL) 2.5 MG tablet Take 1 tablet (2.5 mg total) by mouth daily. 11/26/14  Yes Ripudeep Krystal Eaton, MD  metoprolol succinate (TOPROL-XL) 50 MG 24 hr tablet Take 1 tablet (50 mg total) by mouth daily. Take with or immediately following a meal. 11/26/14  Yes Ripudeep Krystal Eaton, MD  Multiple Vitamin (MULTIVITAMIN WITH  MINERALS) TABS tablet Take 1 tablet by mouth daily.   Yes Historical Provider, MD  multivitamin (RENA-VIT) TABS tablet Take 1 tablet by mouth at bedtime. 01/08/14  Yes Kelvin Cellar, MD  naphazoline-pheniramine (NAPHCON-A) 0.025-0.3 % ophthalmic solution Place 1 drop into both eyes 4 (four) times daily as needed for irritation. 01/08/14  Yes Kelvin Cellar, MD  nitroGLYCERIN (NITROSTAT) 0.4 MG SL tablet Place 1 tablet (0.4 mg total) under the tongue every 5 (five) minutes as needed for chest pain. 11/26/14  Yes Ripudeep Krystal Eaton, MD  promethazine (PHENERGAN) 12.5 MG suppository Place 12.5 mg rectally every 4 (four) hours as needed for nausea or vomiting.   Yes Historical Provider, MD  risperiDONE microspheres (RISPERDAL CONSTA) 25 MG injection Inject 25 mg into the muscle every 14 (fourteen) days. For psychosis   Yes Historical Provider, MD  darbepoetin (ARANESP) 100 MCG/0.5ML SOLN injection Inject 0.5 mLs (100 mcg total) into the skin every Tuesday at 6 PM. Patient not taking: Reported on 12/12/2014 01/08/14   Kelvin Cellar, MD   No Known Allergies  FAMILY HISTORY:  indicated that her mother is deceased. She indicated that her father is deceased.  SOCIAL HISTORY:  reports that she has never smoked. She does not have any smokeless tobacco history on file. She reports that she does not drink alcohol or use illicit drugs.  REVIEW OF SYSTEMS:  Per ED note was in her usual state of health until this evening, dialyzed earlier today with no complications.   SUBJECTIVE:   VITAL SIGNS: Temp:  [97.7 F (36.5 C)] 97.7 F (36.5 C) (12/19 0300) Pulse Rate:  [66-147] 66 (12/19 0515) Resp:  [13-28] 21 (12/19 0515) BP: (107-218)/(43-100) 115/56 mmHg (12/19 0515) SpO2:  [90 %-100 %] 100 % (12/19 0515) FiO2 (%):  [50 %] 50 % (12/19 0403) HEMODYNAMICS:   VENTILATOR SETTINGS: Vent Mode:  [-] BIPAP FiO2 (%):  [50 %] 50 % Set Rate:  [15 bmp] 15 bmp PEEP:  [6 cmH20] 6 cmH20 Pressure Support:  [6 cmH20] 6  cmH20 INTAKE / OUTPUT:  Intake/Output Summary (Last 24 hours) at 12/12/14 0533 Last data filed at 12/12/14 0414  Gross per 24 hour  Intake 1589.3 ml  Output      0 ml  Net 1589.3 ml    PHYSICAL EXAMINATION: General:  Laying in bed, bipap on, responds to light touch by opening eyes.  Neuro:  Responds to light touch. Pt is deaf and communicates by signing  HEENT:  PERRL, EOMI, BiPap mask in place Cardiovascular:  NRRR, II/VI systolic murmur Lungs:  Diffuse rhonchi Abdomen:  Soft, NTND, +BS, no HSM Musculoskeletal:  No joint abnormalities, dialysis fistula Right upper arm Skin:  No bruises, rashes or other lesions.   LABS:  CBC  Recent Labs Lab 12/11/14 2333 12/11/14 2350 12/12/14 0330 12/12/14 0348  WBC 7.7  --  12.4*  --   HGB 9.6* 12.2 10.8* 12.2  HCT 30.0* 36.0 33.1* 36.0  PLT 335  --  387  --  Coag's  Recent Labs Lab 12/12/14 0314  APTT 31  INR 1.06   BMET  Recent Labs Lab 12/11/14 2333 12/11/14 2350 12/12/14 0330 12/12/14 0348  NA 131* 135* 135* 134*  K 3.7 3.7 4.4 4.1  CL 90* 92* 92* 95*  CO2 28  --  28  --   BUN 21 25* 23 24*  CREATININE 2.90* 3.00* 2.91* 3.20*  GLUCOSE 275* 287* 271* 306*   Electrolytes  Recent Labs Lab 12/11/14 2333 12/12/14 0330  CALCIUM 8.8 9.2  MG 2.1  --    Sepsis Markers  Recent Labs Lab 12/11/14 2350  LATICACIDVEN 2.64*   ABG  Recent Labs Lab 12/12/14 0346  PHART 7.297*  PCO2ART 59.3*  PO2ART 58.0*   Liver Enzymes  Recent Labs Lab 12/11/14 2333 12/12/14 0330  AST 79* 65*  ALT 68* 67*  ALKPHOS 82 93  BILITOT 0.3 0.3  ALBUMIN 3.1* 3.6   Cardiac Enzymes  Recent Labs Lab 12/12/14 0314 12/12/14 0330  TROPONINI  --  <0.30  PROBNP 24864.0*  --    Glucose  Recent Labs Lab 12/12/14 0347  GLUCAP 280*    Imaging Ct Head Wo Contrast  12/12/2014   CLINICAL DATA:  Acute onset of altered mental status. Status post last fall with asystole, status post CPR and resuscitation. Initial  encounter. Concern for head or cervical spine injury.  EXAM: CT HEAD WITHOUT CONTRAST  CT CERVICAL SPINE WITHOUT CONTRAST  TECHNIQUE: Multidetector CT imaging of the head and cervical spine was performed following the standard protocol without intravenous contrast. Multiplanar CT image reconstructions of the cervical spine were also generated.  COMPARISON:  None.  FINDINGS: CT HEAD FINDINGS  There is no evidence of acute infarction, mass lesion, or intra- or extra-axial hemorrhage on CT.  Prominence of the ventricles and sulci reflects mild cortical volume loss. Scattered periventricular and subcortical white matter change likely reflects small vessel ischemic microangiopathy.  The brainstem and fourth ventricle are within normal limits. The basal ganglia are unremarkable in appearance. The cerebral hemispheres demonstrate grossly normal gray-white differentiation. No mass effect or midline shift is seen.  There is no evidence of fracture; visualized osseous structures are unremarkable in appearance. The visualized portions of the orbits are within normal limits. The paranasal sinuses and mastoid air cells are well-aerated. No significant soft tissue abnormalities are seen.  CT CERVICAL SPINE FINDINGS  There is no evidence of fracture or subluxation. Vertebral bodies demonstrate normal height and alignment. There is mild narrowing of the intervertebral disc spaces at C3-C4 and C4-C5, with associated anterior and posterior disc osteophyte complexes. Prevertebral soft tissues are within normal limits.  A 0.9 cm hypodensity is noted at the left thyroid lobe. There is mild interstitial prominence at the lung apices. No significant soft tissue abnormalities are seen.  IMPRESSION: 1. No evidence of traumatic intracranial injury or fracture. 2. No evidence of fracture or subluxation along the cervical spine. 3. Mild cortical volume loss and scattered small vessel ischemic microangiopathy. 4. Minimal degenerative change at  the upper cervical spine. 5. Sub-centimeter thyroid nodule(s) noted, too small to characterize, but most likely benign in the absence of known clinical risk factors for thyroid carcinoma. 6. Mild interstitial prominence at the lung apices.   Electronically Signed   By: Garald Balding M.D.   On: 12/12/2014 00:02   Ct Cervical Spine Wo Contrast  12/12/2014   CLINICAL DATA:  Acute onset of altered mental status. Status post last fall with asystole, status post CPR  and resuscitation. Initial encounter. Concern for head or cervical spine injury.  EXAM: CT HEAD WITHOUT CONTRAST  CT CERVICAL SPINE WITHOUT CONTRAST  TECHNIQUE: Multidetector CT imaging of the head and cervical spine was performed following the standard protocol without intravenous contrast. Multiplanar CT image reconstructions of the cervical spine were also generated.  COMPARISON:  None.  FINDINGS: CT HEAD FINDINGS  There is no evidence of acute infarction, mass lesion, or intra- or extra-axial hemorrhage on CT.  Prominence of the ventricles and sulci reflects mild cortical volume loss. Scattered periventricular and subcortical white matter change likely reflects small vessel ischemic microangiopathy.  The brainstem and fourth ventricle are within normal limits. The basal ganglia are unremarkable in appearance. The cerebral hemispheres demonstrate grossly normal gray-white differentiation. No mass effect or midline shift is seen.  There is no evidence of fracture; visualized osseous structures are unremarkable in appearance. The visualized portions of the orbits are within normal limits. The paranasal sinuses and mastoid air cells are well-aerated. No significant soft tissue abnormalities are seen.  CT CERVICAL SPINE FINDINGS  There is no evidence of fracture or subluxation. Vertebral bodies demonstrate normal height and alignment. There is mild narrowing of the intervertebral disc spaces at C3-C4 and C4-C5, with associated anterior and posterior disc  osteophyte complexes. Prevertebral soft tissues are within normal limits.  A 0.9 cm hypodensity is noted at the left thyroid lobe. There is mild interstitial prominence at the lung apices. No significant soft tissue abnormalities are seen.  IMPRESSION: 1. No evidence of traumatic intracranial injury or fracture. 2. No evidence of fracture or subluxation along the cervical spine. 3. Mild cortical volume loss and scattered small vessel ischemic microangiopathy. 4. Minimal degenerative change at the upper cervical spine. 5. Sub-centimeter thyroid nodule(s) noted, too small to characterize, but most likely benign in the absence of known clinical risk factors for thyroid carcinoma. 6. Mild interstitial prominence at the lung apices.   Electronically Signed   By: Garald Balding M.D.   On: 12/12/2014 00:02   Dg Chest Port 1 View  12/12/2014   CLINICAL DATA:  Status post cardiac arrest and resuscitation. Initial encounter.  EXAM: PORTABLE CHEST - 1 VIEW  COMPARISON:  Chest radiograph from 01/02/2014  FINDINGS: There is elevation of the right hemidiaphragm. There is no evidence of focal opacification, pleural effusion or pneumothorax.  The cardiomediastinal silhouette is borderline normal in size. No acute osseous abnormalities are seen. A small left cervical rib is incidentally noted.  IMPRESSION: 1. Elevation of the right hemidiaphragm; lungs remain grossly clear. 2. Small left cervical rib incidentally noted.   Electronically Signed   By: Garald Balding M.D.   On: 12/12/2014 00:04     ASSESSMENT / PLAN:  PULMONARY  A:Flash pulmonary edema  P:   Given 80mg  lasix with urine output Continue bipap, wean as able  CARDIOVASCULAR  A: Vtach, CHF, hypertensive urgency P:  Nitroglycerine gtt for BP, wean as able.  Continue amiodarone gtt Trend troponins Cardiology consulted - will continue to follow  Continue home BP meds  RENAL A:  ESRD, dialysis dependent P:   Consult renal in AM for  dialysis  GASTROINTESTINAL A:  No acute issues P:   Keep NPO while on bipap and at risk for needing intubation  INFECTIOUS A:  UTI P:   BCx2 12/11/14 UC 12/11/14  Abx: Ceftriaxone, start date 12/12/14, day 1/-  ENDOCRINE A:  DM2   P:   SSI for now, will likely need basal bolus regimen  NEUROLOGIC A:  Deaf and mute P:   Communicates through sign language   FAMILY  - Updates: Son is power of attorney as was updated by phone of change in pt status. Pt is full code.    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  Now with flash pulmonary edema 2/2 hypertension.       [facility] [place] Pager: [phone]  12/12/2014, 5:33 AM

## 2014-12-13 ENCOUNTER — Inpatient Hospital Stay (HOSPITAL_COMMUNITY): Payer: Medicare Other

## 2014-12-13 DIAGNOSIS — J811 Chronic pulmonary edema: Secondary | ICD-10-CM | POA: Diagnosis present

## 2014-12-13 DIAGNOSIS — I369 Nonrheumatic tricuspid valve disorder, unspecified: Secondary | ICD-10-CM

## 2014-12-13 DIAGNOSIS — J810 Acute pulmonary edema: Secondary | ICD-10-CM

## 2014-12-13 DIAGNOSIS — R404 Transient alteration of awareness: Secondary | ICD-10-CM

## 2014-12-13 LAB — GLUCOSE, CAPILLARY
Glucose-Capillary: 125 mg/dL — ABNORMAL HIGH (ref 70–99)
Glucose-Capillary: 141 mg/dL — ABNORMAL HIGH (ref 70–99)
Glucose-Capillary: 112 mg/dL — ABNORMAL HIGH (ref 70–99)

## 2014-12-13 LAB — URINE CULTURE
Colony Count: NO GROWTH
Culture: NO GROWTH
Special Requests: NORMAL

## 2014-12-13 LAB — TROPONIN I: Troponin I: 0.37 ng/mL

## 2014-12-13 MED ORDER — ALBUMIN HUMAN 25 % IV SOLN
25.0000 g | Freq: Once | INTRAVENOUS | Status: AC
  Administered 2014-12-13: 25 g via INTRAVENOUS
  Filled 2014-12-13: qty 100

## 2014-12-13 MED ORDER — RISPERIDONE MICROSPHERES 25 MG IM SUSR
25.0000 mg | INTRAMUSCULAR | Status: DC
  Administered 2014-12-14: 25 mg via INTRAMUSCULAR
  Filled 2014-12-13: qty 2

## 2014-12-13 NOTE — Progress Notes (Signed)
[facility] Progress Note Patient Name: JACOBA BELKEN DOB: 04/01/1956 MRN: [id number]   Date of Service  12/13/2014  HPI/Events of Note  Continued hypotension on amio gtt.  Had 4 liters removed with IHD today.  NTG gtt d/ced earlier.  Received 250 cc bolus of NS with increase in BP to the 0000000 systolic.  Now 88/41 (57)  eICU Interventions  Plan: One time albumin 25% 100 ml for hypotension Continue to monitor     Intervention Category Intermediate Interventions: Hypotension - evaluation and management  Bijon Mineer 12/13/2014, 12:55 AM

## 2014-12-13 NOTE — Progress Notes (Signed)
PULMONARY / CRITICAL CARE MEDICINE   Name: [name] MRN: [id number] DOB: 10-28-1956    ADMISSION DATE:  12/11/2014 CONSULTATION DATE:  12/12/14  REFERRING MD :  Dr. Posey Pronto  CHIEF COMPLAINT:  Shortness of breath, AMS, Vtach  INITIAL PRESENTATION: Initially presented to ED after cardiac arrest requiring CPR with ROSC.  STUDIES:  CTA chest 12/18 - no acute process  SIGNIFICANT EVENTS: Became acutely short of breath and unresponsive after transfer to [place]:  No longer requiring BiPAP HD 12/19 3.3kg off, received 250cc bolus overnight  VITAL SIGNS: Temp:  [97.2 F (36.2 C)-99.4 F (37.4 C)] 99.4 F (37.4 C) (12/20 0734) Pulse Rate:  [69-85] 78 (12/20 0800) Resp:  [10-21] 14 (12/20 0800) BP: (75-176)/(21-89) 94/49 mmHg (12/20 0800) SpO2:  [100 %] 100 % (12/20 0800) FiO2 (%):  [30 %] 30 % (12/19 1100) Weight:  [83.1 kg (183 lb 3.2 oz)-87.8 kg (193 lb 9 oz)] 83.1 kg (183 lb 3.2 oz) (12/20 0615) HEMODYNAMICS:   VENTILATOR SETTINGS: Vent Mode:  [-] BIPAP FiO2 (%):  [30 %] 30 % Set Rate:  [15 bmp] 15 bmp PEEP:  [6 cmH20] 6 cmH20 INTAKE / OUTPUT:  Intake/Output Summary (Last 24 hours) at 12/13/14 [id number] Last data filed at 12/13/14 0800  Gross per 24 hour  Intake 1377.86 ml  Output   3840 ml  Net -2462.14 ml    PHYSICAL EXAMINATION: General:  Laying in bed, responds to light touch by opening eyes.  Neuro:  Responds to light touch. Pt is deaf, blind.  HEENT:  PERRL, EOMI, No stridor Cardiovascular:  NRRR, II/VI systolic murmur Lungs:  Diffuse rhonchi, few basilar crackles, diminished  Abdomen:  Soft, NTND, +BS, no HSM Musculoskeletal:  No joint abnormalities, dialysis fistula Right upper arm Skin:  No bruises, rashes or other lesions.   LABS:  CBC  Recent Labs Lab 12/11/14 2333 12/11/14 2350 12/12/14 0330 12/12/14 0348  WBC 7.7  --  12.4*  --   HGB 9.6* 12.2 10.8* 12.2  HCT 30.0* 36.0 33.1* 36.0  PLT 335  --  387  --    Coag's  Recent  Labs Lab 12/12/14 0314  APTT 31  INR 1.06   BMET  Recent Labs Lab 12/11/14 2333 12/11/14 2350 12/12/14 0330 12/12/14 0348  NA 131* 135* 135* 134*  K 3.7 3.7 4.4 4.1  CL 90* 92* 92* 95*  CO2 28  --  28  --   BUN 21 25* 23 24*  CREATININE 2.90* 3.00* 2.91* 3.20*  GLUCOSE 275* 287* 271* 306*   Electrolytes  Recent Labs Lab 12/11/14 2333 12/12/14 0330  CALCIUM 8.8 9.2  MG 2.1  --    Sepsis Markers  Recent Labs Lab 12/11/14 2350  LATICACIDVEN 2.64*   ABG  Recent Labs Lab 12/12/14 0346 12/12/14 0606  PHART 7.297* 7.386  PCO2ART 59.3* 45.3*  PO2ART 58.0* 204.0*   Liver Enzymes  Recent Labs Lab 12/11/14 2333 12/12/14 0330  AST 79* 65*  ALT 68* 67*  ALKPHOS 82 93  BILITOT 0.3 0.3  ALBUMIN 3.1* 3.6   Cardiac Enzymes  Recent Labs Lab 12/12/14 0314  12/12/14 0910 12/12/14 1558 12/12/14 2317  TROPONINI  --   < > <0.30 0.48* 0.37*  PROBNP 24864.0*  --   --   --   --   < > = values in this interval not displayed. Glucose  Recent Labs Lab 12/12/14 0347 12/12/14 0914 12/12/14 1215 12/12/14 1740 12/12/14 2339 12/13/14 0616  GLUCAP 280*  294* 135* 131* 118* 125*    Imaging Ct Angio Chest Pe W/cm &/or Wo Cm  12/12/2014   CLINICAL DATA:  Acute onset of chest pain and shortness of breath. Patient was unresponsive, with asystole, status post cardiac resuscitation. Initial encounter.  EXAM: CT ANGIOGRAPHY CHEST WITH CONTRAST  TECHNIQUE: Multidetector CT imaging of the chest was performed using the standard protocol during bolus administration of intravenous contrast. Multiplanar CT image reconstructions and MIPs were obtained to evaluate the vascular anatomy.  CONTRAST:  6mL OMNIPAQUE IOHEXOL 350 MG/ML SOLN  COMPARISON:  Chest radiograph performed 12/11/2014  FINDINGS: There is no evidence of pulmonary embolus.  Mild peripheral nodular densities are thought to reflect atelectasis. There is slight interstitial prominence, without definite pulmonary edema.  There is no evidence of significant focal consolidation, pleural effusion or pneumothorax. No suspicious masses are identified; no abnormal focal contrast enhancement is seen.  There is mild diffuse peribronchial thickening, particularly at the hila. No definite mediastinal lymphadenopathy is seen. No pericardial effusion is identified. The great vessels are grossly unremarkable in appearance. No axillary lymphadenopathy is seen. Multiple hypodensities are seen within the thyroid gland, measuring up to 1.4 cm in size, with minimal calcification.  The visualized portions of the liver and spleen are unremarkable.  No acute osseous abnormalities are seen.  Review of the MIP images confirms the above findings.  IMPRESSION: 1. No evidence of pulmonary embolus. 2. Slight interstitial prominence noted, without definite pulmonary edema. Mild peripheral nodular densities are thought to reflect atelectasis. Lungs otherwise grossly clear. 3. Mild diffuse peribronchial thickening noted, nonspecific in appearance. 4. Multiple hypodensities within the thyroid gland measuring up to 1.4 cm in size. Consider further evaluation with thyroid ultrasound. If patient is clinically hyperthyroid, consider nuclear medicine thyroid uptake and scan.   Electronically Signed   By: Garald Balding M.D.   On: 12/12/2014 01:51   Dg Chest Port 1 View  12/12/2014   CLINICAL DATA:  Acute onset of shortness of breath. Assess for flash pulmonary edema.  EXAM: PORTABLE CHEST - 1 VIEW  COMPARISON:  Chest radiograph from 12/11/2014  FINDINGS: The lungs are well-aerated. Mild right-sided airspace opacities could reflect mildly asymmetric interstitial edema. There is trace fluid along the right minor fissure. No pneumothorax is identified.  The cardiomediastinal silhouette is borderline normal in size. An external pacing pad is noted overlying the left hemithorax. No acute osseous abnormalities are seen.  IMPRESSION: Mild right-sided airspace opacities  could reflect mildly asymmetric interstitial edema. Trace fluid along the right minor fissure.   Electronically Signed   By: Garald Balding M.D.   On: 12/12/2014 04:55     ASSESSMENT / PLAN:  PULMONARY  A:Flash pulmonary edema  P:   Stabilized with BiPAP, no longer requiring UOP at baseline is minimal, has improved with HD  CARDIOVASCULAR  A: Vtach,  CHF,  Hypertensive urgency, resolved, now borderline hypotension P:  Continue amiodarone gtt Appreciate cardiology input, not a candidate for AICD due to overall medical condition, plan to manage medically Continue home BP meds  RENAL A:  ESRD, dialysis dependent P:   Underwent HD 12/19 and planning to perform 12/21  GASTROINTESTINAL A:  No acute issues P:   Clear diet, advance as able  INFECTIOUS A:  UTI P:   BCx2 12/11/14 UC 12/11/14  Abx: Ceftriaxone, start date 12/12/14, day 2/x  ENDOCRINE A:  DM2   P:   SSI for now, will likely need basal bolus regimen  NEUROLOGIC A:  Deaf and  mute, also partially blind P:   Communicates through sign language   FAMILY  - Updates: Son is power of attorney as was updated by phone of change in pt status 12/19 am by Dr Lincoln Maxin. Pt is full code.    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  Then flash pulmonary edema 2/2 hypertension. Off BiPAP and stabilizing. Plan HD on 12/21. Can move to SDU 12/20     Baltazar Apo, MD, PhD 12/13/2014, 9:03 AM [facility] [phone] or if no answer [phone]

## 2014-12-13 NOTE — Progress Notes (Signed)
  Echocardiogram 2D Echocardiogram has been performed.  [name] 12/13/2014, 11:25 AM

## 2014-12-13 NOTE — Progress Notes (Signed)
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 LOC episode / possible arrest - occurred at [facility], AED did not recognize VT/VF, got ~6 min CPR. Etiology may have been resp arrest d/t pulm edema and vol overload. She was here w similar thing last admission. 2. Hypotension - suspect this is all medication related, she was started on her home BP meds x 4 + Imdur. Suspect she doesn't take her meds at home and this is just overmedication for her.  Have stopped all these meds, BP should come up. 3. Vol excess / pulm edema - still overloaded, a recurrent issue, will need aggressive UF in hospital again. Need to discuss fluid compliance w pt's family or whoever is in charge of her dietary regulation. BP too low for HD today, plan HD Monday then again Tuesday per holiday schedule.  4. ESRD on HD 5. CAD / hx MI / ischemic CM EF 35% 6. DM2  7. Anemia of CKD cont aranesp, IV Fe 8. Psych - hx of paranoid behavior 9. HTN - clon/ hydral / lisinopril / MTP on hold now  Plan- HD tomorrow and Tuesday, get vol down, stopped all BP meds and Imdur.     Kelly Splinter MD  pager [phone]    cell [phone]  12/13/2014, 8:09 AM     Recent Labs Lab 12/11/14 2333 12/11/14 2350 12/12/14 0330 12/12/14 0348  NA 131* 135* 135* 134*  K 3.7 3.7 4.4 4.1   CL 90* 92* 92* 95*  CO2 28  --  28  --   GLUCOSE 275* 287* 271* 306*  BUN 21 25* 23 24*  CREATININE 2.90* 3.00* 2.91* 3.20*  CALCIUM 8.8  --  9.2  --     Recent Labs Lab 12/11/14 2333 12/12/14 0330  AST 79* 65*  ALT 68* 67*  ALKPHOS 82 93  BILITOT 0.3 0.3  PROT 7.5 8.4*  ALBUMIN 3.1* 3.6    Recent Labs Lab 12/11/14 2333 12/11/14 2350 12/12/14 0330 12/12/14 0348  WBC 7.7  --  12.4*  --   NEUTROABS 5.4  --  10.0*  --   HGB 9.6* 12.2 10.8* 12.2  HCT 30.0* 36.0 33.1* 36.0  MCV 98.0  --  99.7  --   PLT 335  --  387  --    . antiseptic oral rinse  7 mL Mouth Rinse q12n4p  . aspirin  81 mg Oral Daily  . atorvastatin  10 mg Oral Daily  . calcium acetate  667 mg Oral TID WC  . cefTRIAXone (ROCEPHIN)  IV  1 g Intravenous Q24H  . chlorhexidine  15 mL Mouth Rinse BID  .  clopidogrel  75 mg Oral Daily  . [START ON 12/16/2014] darbepoetin (ARANESP) injection - DIALYSIS  40 mcg Intravenous Q Wed-HD  . [START ON 12/16/2014] ferric gluconate (FERRLECIT/NULECIT) IV  125 mg Intravenous Q Wed-HD  . heparin  5,000 Units Subcutaneous 3 times per day  . insulin aspart  0-15 Units Subcutaneous Q6H  . risperiDONE microspheres  25 mg Intramuscular Q14 Days  . sodium chloride  3 mL Intravenous Q12H   . sodium chloride 10 mL/hr at 12/12/14 0445  . amiodarone 30 mg/hr (12/13/14 0358)   acetaminophen **OR** acetaminophen, albuterol, bisacodyl, levalbuterol, nitroGLYCERIN, ondansetron **OR** ondansetron (ZOFRAN) IV, promethazine

## 2014-12-13 NOTE — Progress Notes (Signed)
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 On amiodarone gtts. Short 6 beat run of slower NSVT this morning, but rhythm is more stable.  Objective:  Temp:  [97.2 F (36.2 C)-99.4 F (37.4 C)] 99.4 F (37.4 C) (12/20 0734) Pulse Rate:  [69-85] 75 (12/20 0734) Resp:  [10-21] 16 (12/20 0734) BP: (75-176)/(21-89) 95/50 mmHg (12/20 0700) SpO2:  [99 %-100 %] 100 % (12/20 0734) FiO2 (%):  [30 %] 30 % (12/19 1100) Weight:  [183 lb 3.2 oz (83.1 kg)-193 lb 9 oz (87.8 kg)] 183 lb 3.2 oz (83.1 kg) (12/20 0615) Weight change: 7 lb 11.5 oz (3.5 kg)  Intake/Output from previous day: 12/19 0701 - 12/20 0700 In: 1555.8 [P.O.:340; I.V.:915.8; IV Piggyback:300] Out: 3847 [Urine:47]  Intake/Output from this shift:    Medications: Current Facility-Administered Medications  Medication Dose Route Frequency Provider Last Rate Last Dose  . 0.9 %  sodium chloride infusion   Intravenous Continuous Berle Mull, MD 10 mL/hr at 12/12/14 0445    . acetaminophen (TYLENOL) tablet 650 mg  650 mg Oral Q6H PRN Berle Mull, MD       Or  . acetaminophen (TYLENOL) suppository 650 mg  650 mg Rectal Q6H PRN Berle Mull, MD      . albuterol (PROVENTIL) (2.5 MG/3ML) 0.083% nebulizer solution 2.5 mg  2.5 mg Nebulization Q4H PRN Berle Mull, MD      . amiodarone (NEXTERONE PREMIX) 360 MG/200ML (1.8 mg/mL) IV infusion  30 mg/hr Intravenous Continuous Berle Mull, MD 16.7 mL/hr at 12/13/14 0358 30 mg/hr at 12/13/14 0358  . antiseptic oral rinse (CPC / CETYLPYRIDINIUM CHLORIDE 0.05%) solution 7 mL  7 mL Mouth Rinse q12n4p Berle Mull, MD   7 mL at 12/12/14 1600  . aspirin chewable tablet 81 mg  81 mg Oral Daily Berle Mull, MD   81 mg at 12/12/14 0933  . atorvastatin (LIPITOR) tablet 10 mg  10 mg Oral Daily Berle Mull, MD   10 mg at 12/12/14 0933  . bisacodyl (DULCOLAX) suppository 10 mg  10 mg Rectal Daily PRN Berle Mull, MD      . calcium acetate (PHOSLO) capsule 667 mg   667 mg Oral TID WC Berle Mull, MD   667 mg at 12/12/14 1730  . cefTRIAXone (ROCEPHIN) 1 g in dextrose 5 % 50 mL IVPB - Premix  1 g Intravenous Q24H Berle Mull, MD   1 g at 12/13/14 0356  . chlorhexidine (PERIDEX) 0.12 % solution 15 mL  15 mL Mouth Rinse BID Berle Mull, MD   15 mL at 12/12/14 2044  . clopidogrel (PLAVIX) tablet 75 mg  75 mg Oral Daily Berle Mull, MD   75 mg at 12/12/14 0933  . [START ON 12/16/2014] Darbepoetin Alfa (ARANESP) injection 40 mcg  40 mcg Intravenous Q Wed-HD Sol Blazing, MD      . Derrill Memo ON 12/16/2014] ferric gluconate (NULECIT) 125 mg in sodium chloride 0.9 % 100 mL IVPB  125 mg Intravenous Q Wed-HD Sol Blazing, MD      . heparin injection 5,000 Units  5,000 Units Subcutaneous 3 times per day Berle Mull, MD   5,000 Units at 12/13/14 [phone]  . insulin aspart (novoLOG) injection 0-15 Units  0-15 Units Subcutaneous Q6H Collene Gobble, MD   2 Units at 12/13/14 0631  . levalbuterol (XOPENEX) nebulizer solution 0.63 mg  0.63 mg Nebulization Q2H PRN Berle Mull, MD      . nitroGLYCERIN (NITROSTAT) SL tablet 0.4  mg  0.4 mg Sublingual Q5 min PRN Berle Mull, MD      . ondansetron [facility]) tablet 4 mg  4 mg Oral Q6H PRN Berle Mull, MD       Or  . ondansetron (ZOFRAN) injection 4 mg  4 mg Intravenous Q6H PRN Berle Mull, MD      . promethazine (PHENERGAN) suppository 12.5 mg  12.5 mg Rectal Q4H PRN Berle Mull, MD      . risperiDONE microspheres (RISPERDAL CONSTA) injection 25 mg  25 mg Intramuscular Q14 Days Berle Mull, MD      . sodium chloride 0.9 % injection 3 mL  3 mL Intravenous Q12H Berle Mull, MD   3 mL at 12/12/14 2155    Physical Exam: General appearance: alert and moaning, blind and deaf Neck: no carotid bruit and no JVD Lungs: clear to auscultation bilaterally Heart: regular rate and rhythm Abdomen: soft, non-tender; bowel sounds normal; no masses,  no organomegaly and obese Extremities: extremities normal, atraumatic, no cyanosis or  edema and stage 1 decubiti on the heels bilaterally Pulses: 2+ and symmetric Skin: Skin color, texture, turgor normal. No rashes or lesions Neurologic: Mental status: Difficult to assess mental status, but eyes open, not responding normally to commands  Lab Results: Results for orders placed or performed during the hospital encounter of 12/11/14 (from the past 48 hour(s))  CBC with Differential     Status: Abnormal   Collection Time: 12/11/14 11:33 PM  Result Value Ref Range   WBC 7.7 4.0 - 10.5 K/uL   RBC 3.06 (L) 3.87 - 5.11 MIL/uL   Hemoglobin 9.6 (L) 12.0 - 15.0 g/dL   HCT 30.0 (L) 36.0 - 46.0 %   MCV 98.0 78.0 - 100.0 fL   MCH 31.4 26.0 - 34.0 pg   MCHC 32.0 30.0 - 36.0 g/dL   RDW 14.3 11.5 - 15.5 %   Platelets 335 150 - 400 K/uL   Neutrophils Relative % 70 43 - 77 %   Neutro Abs 5.4 1.7 - 7.7 K/uL   Lymphocytes Relative 17 12 - 46 %   Lymphs Abs 1.3 0.7 - 4.0 K/uL   Monocytes Relative 11 3 - 12 %   Monocytes Absolute 0.8 0.1 - 1.0 K/uL   Eosinophils Relative 1 0 - 5 %   Eosinophils Absolute 0.1 0.0 - 0.7 K/uL   Basophils Relative 1 0 - 1 %   Basophils Absolute 0.1 0.0 - 0.1 K/uL  Comprehensive metabolic panel     Status: Abnormal   Collection Time: 12/11/14 11:33 PM  Result Value Ref Range   Sodium 131 (L) 137 - 147 mEq/L   Potassium 3.7 3.7 - 5.3 mEq/L   Chloride 90 (L) 96 - 112 mEq/L   CO2 28 19 - 32 mEq/L   Glucose, Bld 275 (H) 70 - 99 mg/dL   BUN 21 6 - 23 mg/dL   Creatinine, Ser 2.90 (H) 0.50 - 1.10 mg/dL   Calcium 8.8 8.4 - 10.5 mg/dL   Total Protein 7.5 6.0 - 8.3 g/dL   Albumin 3.1 (L) 3.5 - 5.2 g/dL   AST 79 (H) 0 - 37 U/L   ALT 68 (H) 0 - 35 U/L   Alkaline Phosphatase 82 39 - 117 U/L   Total Bilirubin 0.3 0.3 - 1.2 mg/dL   GFR calc non Af Amer 17 (L) >90 mL/min   GFR calc Af Amer 19 (L) >90 mL/min    Comment: (NOTE) The eGFR has been calculated using  the CKD EPI equation. This calculation has not been validated in all clinical situations. eGFR's  persistently <90 mL/min signify possible Chronic Kidney Disease.    Anion gap 13 5 - 15  Lipase, blood     Status: None   Collection Time: 12/11/14 11:33 PM  Result Value Ref Range   Lipase 50 11 - 59 U/L  Magnesium     Status: None   Collection Time: 12/11/14 11:33 PM  Result Value Ref Range   Magnesium 2.1 1.5 - 2.5 mg/dL  I-stat troponin, ED     Status: Abnormal   Collection Time: 12/11/14 11:48 PM  Result Value Ref Range   Troponin i, poc 0.12 (HH) 0.00 - 0.08 ng/mL   Comment NOTIFIED PHYSICIAN    Comment 3            Comment: Due to the release kinetics of cTnI, a negative result within the first hours of the onset of symptoms does not rule out myocardial infarction with certainty. If myocardial infarction is still suspected, repeat the test at appropriate intervals.   I-Stat CG4 Lactic Acid, ED     Status: Abnormal   Collection Time: 12/11/14 11:50 PM  Result Value Ref Range   Lactic Acid, Venous 2.64 (H) 0.5 - 2.2 mmol/L  I-stat chem 8, ed     Status: Abnormal   Collection Time: 12/11/14 11:50 PM  Result Value Ref Range   Sodium 135 (L) 137 - 147 mEq/L   Potassium 3.7 3.7 - 5.3 mEq/L   Chloride 92 (L) 96 - 112 mEq/L   BUN 25 (H) 6 - 23 mg/dL   Creatinine, Ser 3.00 (H) 0.50 - 1.10 mg/dL   Glucose, Bld 287 (H) 70 - 99 mg/dL   Calcium, Ion 1.01 (L) 1.12 - 1.23 mmol/L   TCO2 26 0 - 100 mmol/L   Hemoglobin 12.2 12.0 - 15.0 g/dL   HCT 36.0 36.0 - 46.0 %  Urinalysis, Routine w reflex microscopic     Status: Abnormal   Collection Time: 12/12/14 12:49 AM  Result Value Ref Range   Color, Urine ORANGE (A) YELLOW    Comment: BIOCHEMICALS MAY BE AFFECTED BY COLOR   APPearance CLOUDY (A) CLEAR   Specific Gravity, Urine 1.023 1.005 - 1.030   pH 5.0 5.0 - 8.0   Glucose, UA NEGATIVE NEGATIVE mg/dL   Hgb urine dipstick NEGATIVE NEGATIVE   Bilirubin Urine MODERATE (A) NEGATIVE   Ketones, ur 15 (A) NEGATIVE mg/dL   Protein, ur 100 (A) NEGATIVE mg/dL   Urobilinogen, UA 0.2  0.0 - 1.0 mg/dL   Nitrite POSITIVE (A) NEGATIVE   Leukocytes, UA SMALL (A) NEGATIVE  Urine microscopic-add on     Status: Abnormal   Collection Time: 12/12/14 12:49 AM  Result Value Ref Range   Squamous Epithelial / LPF FEW (A) RARE   WBC, UA 7-10 <3 WBC/hpf   RBC / HPF 0-2 <3 RBC/hpf   Bacteria, UA FEW (A) RARE   Casts HYALINE CASTS (A) NEGATIVE  Pro b natriuretic peptide     Status: Abnormal   Collection Time: 12/12/14  3:14 AM  Result Value Ref Range   Pro B Natriuretic peptide (BNP) 24864.0 (H) 0 - 125 pg/mL  Protime-INR     Status: None   Collection Time: 12/12/14  3:14 AM  Result Value Ref Range   Prothrombin Time 13.9 11.6 - 15.2 seconds   INR 1.06 0.00 - 1.49  APTT     Status: None   Collection  Time: 12/12/14  3:14 AM  Result Value Ref Range   aPTT 31 24 - 37 seconds  TSH     Status: None   Collection Time: 12/12/14  3:30 AM  Result Value Ref Range   TSH 1.950 0.350 - 4.500 uIU/mL  CBC with Differential     Status: Abnormal   Collection Time: 12/12/14  3:30 AM  Result Value Ref Range   WBC 12.4 (H) 4.0 - 10.5 K/uL   RBC 3.32 (L) 3.87 - 5.11 MIL/uL   Hemoglobin 10.8 (L) 12.0 - 15.0 g/dL   HCT 33.1 (L) 36.0 - 46.0 %   MCV 99.7 78.0 - 100.0 fL   MCH 32.5 26.0 - 34.0 pg   MCHC 32.6 30.0 - 36.0 g/dL   RDW 14.4 11.5 - 15.5 %   Platelets 387 150 - 400 K/uL   Neutrophils Relative % 81 (H) 43 - 77 %   Neutro Abs 10.0 (H) 1.7 - 7.7 K/uL   Lymphocytes Relative 11 (L) 12 - 46 %   Lymphs Abs 1.3 0.7 - 4.0 K/uL   Monocytes Relative 8 3 - 12 %   Monocytes Absolute 1.0 0.1 - 1.0 K/uL   Eosinophils Relative 0 0 - 5 %   Eosinophils Absolute 0.0 0.0 - 0.7 K/uL   Basophils Relative 0 0 - 1 %   Basophils Absolute 0.0 0.0 - 0.1 K/uL  Comprehensive metabolic panel     Status: Abnormal   Collection Time: 12/12/14  3:30 AM  Result Value Ref Range   Sodium 135 (L) 137 - 147 mEq/L   Potassium 4.4 3.7 - 5.3 mEq/L   Chloride 92 (L) 96 - 112 mEq/L   CO2 28 19 - 32 mEq/L   Glucose,  Bld 271 (H) 70 - 99 mg/dL   BUN 23 6 - 23 mg/dL   Creatinine, Ser 2.91 (H) 0.50 - 1.10 mg/dL   Calcium 9.2 8.4 - 10.5 mg/dL   Total Protein 8.4 (H) 6.0 - 8.3 g/dL   Albumin 3.6 3.5 - 5.2 g/dL   AST 65 (H) 0 - 37 U/L   ALT 67 (H) 0 - 35 U/L   Alkaline Phosphatase 93 39 - 117 U/L   Total Bilirubin 0.3 0.3 - 1.2 mg/dL   GFR calc non Af Amer 17 (L) >90 mL/min   GFR calc Af Amer 19 (L) >90 mL/min    Comment: (NOTE) The eGFR has been calculated using the CKD EPI equation. This calculation has not been validated in all clinical situations. eGFR's persistently <90 mL/min signify possible Chronic Kidney Disease.    Anion gap 15 5 - 15  Troponin I     Status: None   Collection Time: 12/12/14  3:30 AM  Result Value Ref Range   Troponin I <0.30 <0.30 ng/mL    Comment:        Due to the release kinetics of cTnI, a negative result within the first hours of the onset of symptoms does not rule out myocardial infarction with certainty. If myocardial infarction is still suspected, repeat the test at appropriate intervals.    CK total and CKMB (cardiac)     Status: Abnormal   Collection Time: 12/12/14  3:30 AM  Result Value Ref Range   Total CK 108 7 - 177 U/L   CK, MB 3.5 0.3 - 4.0 ng/mL   Relative Index 3.2 (H) 0.0 - 2.5  I-STAT 3, arterial blood gas (G3+)     Status: Abnormal  Collection Time: 12/12/14  3:46 AM  Result Value Ref Range   pH, Arterial 7.297 (L) 7.350 - 7.450   pCO2 arterial 59.3 (HH) 35.0 - 45.0 mmHg   pO2, Arterial 58.0 (L) 80.0 - 100.0 mmHg   Bicarbonate 29.0 (H) 20.0 - 24.0 mEq/L   TCO2 31 0 - 100 mmol/L   O2 Saturation 86.0 %   Acid-Base Excess 1.0 0.0 - 2.0 mmol/L   Patient temperature 98.8 F    Collection site RADIAL, ALLEN'S TEST ACCEPTABLE    Drawn by RT    Sample type ARTERIAL    Comment NOTIFIED PHYSICIAN   Glucose, capillary     Status: Abnormal   Collection Time: 12/12/14  3:47 AM  Result Value Ref Range   Glucose-Capillary 280 (H) 70 - 99 mg/dL    I-STAT, chem 8     Status: Abnormal   Collection Time: 12/12/14  3:48 AM  Result Value Ref Range   Sodium 134 (L) 137 - 147 mEq/L   Potassium 4.1 3.7 - 5.3 mEq/L   Chloride 95 (L) 96 - 112 mEq/L   BUN 24 (H) 6 - 23 mg/dL   Creatinine, Ser 3.20 (H) 0.50 - 1.10 mg/dL   Glucose, Bld 306 (H) 70 - 99 mg/dL   Calcium, Ion 1.14 1.12 - 1.23 mmol/L   TCO2 26 0 - 100 mmol/L   Hemoglobin 12.2 12.0 - 15.0 g/dL   HCT 36.0 36.0 - 46.0 %  I-STAT 3, arterial blood gas (G3+)     Status: Abnormal   Collection Time: 12/12/14  6:06 AM  Result Value Ref Range   pH, Arterial 7.386 7.350 - 7.450   pCO2 arterial 45.3 (H) 35.0 - 45.0 mmHg   pO2, Arterial 204.0 (H) 80.0 - 100.0 mmHg   Bicarbonate 27.4 (H) 20.0 - 24.0 mEq/L   TCO2 29 0 - 100 mmol/L   O2 Saturation 100.0 %   Acid-Base Excess 2.0 0.0 - 2.0 mmol/L   Patient temperature 97.0 F    Collection site RADIAL, ALLEN'S TEST ACCEPTABLE    Drawn by RT    Sample type ARTERIAL   Troponin I     Status: None   Collection Time: 12/12/14  9:10 AM  Result Value Ref Range   Troponin I <0.30 <0.30 ng/mL    Comment:        Due to the release kinetics of cTnI, a negative result within the first hours of the onset of symptoms does not rule out myocardial infarction with certainty. If myocardial infarction is still suspected, repeat the test at appropriate intervals.   Glucose, capillary     Status: Abnormal   Collection Time: 12/12/14  9:14 AM  Result Value Ref Range   Glucose-Capillary 294 (H) 70 - 99 mg/dL   Comment 1 Capillary Sample   Glucose, capillary     Status: Abnormal   Collection Time: 12/12/14 12:15 PM  Result Value Ref Range   Glucose-Capillary 135 (H) 70 - 99 mg/dL   Comment 1 Capillary Sample   Troponin I     Status: Abnormal   Collection Time: 12/12/14  3:58 PM  Result Value Ref Range   Troponin I 0.48 (HH) <0.30 ng/mL    Comment: CRITICAL RESULT CALLED TO, READ BACK BY AND VERIFIED WITH: J.ROBERTS,RN 2015 12/12/14 M.CAMPBELL         Due to the release kinetics of cTnI, a negative result within the first hours of the onset of symptoms does not rule out myocardial infarction  with certainty. If myocardial infarction is still suspected, repeat the test at appropriate intervals.   Glucose, capillary     Status: Abnormal   Collection Time: 12/12/14  5:40 PM  Result Value Ref Range   Glucose-Capillary 131 (H) 70 - 99 mg/dL   Comment 1 Capillary Sample   Troponin I     Status: Abnormal   Collection Time: 12/12/14 11:17 PM  Result Value Ref Range   Troponin I 0.37 (HH) <0.30 ng/mL    Comment:        Due to the release kinetics of cTnI, a negative result within the first hours of the onset of symptoms does not rule out myocardial infarction with certainty. If myocardial infarction is still suspected, repeat the test at appropriate intervals. CRITICAL VALUE NOTED.  VALUE IS CONSISTENT WITH PREVIOUSLY REPORTED AND CALLED VALUE.   Glucose, capillary     Status: Abnormal   Collection Time: 12/12/14 11:39 PM  Result Value Ref Range   Glucose-Capillary 118 (H) 70 - 99 mg/dL   Comment 1 Capillary Sample   Glucose, capillary     Status: Abnormal   Collection Time: 12/13/14  6:16 AM  Result Value Ref Range   Glucose-Capillary 125 (H) 70 - 99 mg/dL    Imaging: Ct Head Wo Contrast  12/12/2014   CLINICAL DATA:  Acute onset of altered mental status. Status post last fall with asystole, status post CPR and resuscitation. Initial encounter. Concern for head or cervical spine injury.  EXAM: CT HEAD WITHOUT CONTRAST  CT CERVICAL SPINE WITHOUT CONTRAST  TECHNIQUE: Multidetector CT imaging of the head and cervical spine was performed following the standard protocol without intravenous contrast. Multiplanar CT image reconstructions of the cervical spine were also generated.  COMPARISON:  None.  FINDINGS: CT HEAD FINDINGS  There is no evidence of acute infarction, mass lesion, or intra- or extra-axial hemorrhage on CT.  Prominence  of the ventricles and sulci reflects mild cortical volume loss. Scattered periventricular and subcortical white matter change likely reflects small vessel ischemic microangiopathy.  The brainstem and fourth ventricle are within normal limits. The basal ganglia are unremarkable in appearance. The cerebral hemispheres demonstrate grossly normal gray-white differentiation. No mass effect or midline shift is seen.  There is no evidence of fracture; visualized osseous structures are unremarkable in appearance. The visualized portions of the orbits are within normal limits. The paranasal sinuses and mastoid air cells are well-aerated. No significant soft tissue abnormalities are seen.  CT CERVICAL SPINE FINDINGS  There is no evidence of fracture or subluxation. Vertebral bodies demonstrate normal height and alignment. There is mild narrowing of the intervertebral disc spaces at C3-C4 and C4-C5, with associated anterior and posterior disc osteophyte complexes. Prevertebral soft tissues are within normal limits.  A 0.9 cm hypodensity is noted at the left thyroid lobe. There is mild interstitial prominence at the lung apices. No significant soft tissue abnormalities are seen.  IMPRESSION: 1. No evidence of traumatic intracranial injury or fracture. 2. No evidence of fracture or subluxation along the cervical spine. 3. Mild cortical volume loss and scattered small vessel ischemic microangiopathy. 4. Minimal degenerative change at the upper cervical spine. 5. Sub-centimeter thyroid nodule(s) noted, too small to characterize, but most likely benign in the absence of known clinical risk factors for thyroid carcinoma. 6. Mild interstitial prominence at the lung apices.   Electronically Signed   By: Garald Balding M.D.   On: 12/12/2014 00:02   Ct Angio Chest Pe W/cm &/or Wo Cm  12/12/2014   CLINICAL DATA:  Acute onset of chest pain and shortness of breath. Patient was unresponsive, with asystole, status post cardiac  resuscitation. Initial encounter.  EXAM: CT ANGIOGRAPHY CHEST WITH CONTRAST  TECHNIQUE: Multidetector CT imaging of the chest was performed using the standard protocol during bolus administration of intravenous contrast. Multiplanar CT image reconstructions and MIPs were obtained to evaluate the vascular anatomy.  CONTRAST:  9m OMNIPAQUE IOHEXOL 350 MG/ML SOLN  COMPARISON:  Chest radiograph performed 12/11/2014  FINDINGS: There is no evidence of pulmonary embolus.  Mild peripheral nodular densities are thought to reflect atelectasis. There is slight interstitial prominence, without definite pulmonary edema. There is no evidence of significant focal consolidation, pleural effusion or pneumothorax. No suspicious masses are identified; no abnormal focal contrast enhancement is seen.  There is mild diffuse peribronchial thickening, particularly at the hila. No definite mediastinal lymphadenopathy is seen. No pericardial effusion is identified. The great vessels are grossly unremarkable in appearance. No axillary lymphadenopathy is seen. Multiple hypodensities are seen within the thyroid gland, measuring up to 1.4 cm in size, with minimal calcification.  The visualized portions of the liver and spleen are unremarkable.  No acute osseous abnormalities are seen.  Review of the MIP images confirms the above findings.  IMPRESSION: 1. No evidence of pulmonary embolus. 2. Slight interstitial prominence noted, without definite pulmonary edema. Mild peripheral nodular densities are thought to reflect atelectasis. Lungs otherwise grossly clear. 3. Mild diffuse peribronchial thickening noted, nonspecific in appearance. 4. Multiple hypodensities within the thyroid gland measuring up to 1.4 cm in size. Consider further evaluation with thyroid ultrasound. If patient is clinically hyperthyroid, consider nuclear medicine thyroid uptake and scan.   Electronically Signed   By: JGarald BaldingM.D.   On: 12/12/2014 01:51   Ct Cervical  Spine Wo Contrast  12/12/2014   CLINICAL DATA:  Acute onset of altered mental status. Status post last fall with asystole, status post CPR and resuscitation. Initial encounter. Concern for head or cervical spine injury.  EXAM: CT HEAD WITHOUT CONTRAST  CT CERVICAL SPINE WITHOUT CONTRAST  TECHNIQUE: Multidetector CT imaging of the head and cervical spine was performed following the standard protocol without intravenous contrast. Multiplanar CT image reconstructions of the cervical spine were also generated.  COMPARISON:  None.  FINDINGS: CT HEAD FINDINGS  There is no evidence of acute infarction, mass lesion, or intra- or extra-axial hemorrhage on CT.  Prominence of the ventricles and sulci reflects mild cortical volume loss. Scattered periventricular and subcortical white matter change likely reflects small vessel ischemic microangiopathy.  The brainstem and fourth ventricle are within normal limits. The basal ganglia are unremarkable in appearance. The cerebral hemispheres demonstrate grossly normal gray-white differentiation. No mass effect or midline shift is seen.  There is no evidence of fracture; visualized osseous structures are unremarkable in appearance. The visualized portions of the orbits are within normal limits. The paranasal sinuses and mastoid air cells are well-aerated. No significant soft tissue abnormalities are seen.  CT CERVICAL SPINE FINDINGS  There is no evidence of fracture or subluxation. Vertebral bodies demonstrate normal height and alignment. There is mild narrowing of the intervertebral disc spaces at C3-C4 and C4-C5, with associated anterior and posterior disc osteophyte complexes. Prevertebral soft tissues are within normal limits.  A 0.9 cm hypodensity is noted at the left thyroid lobe. There is mild interstitial prominence at the lung apices. No significant soft tissue abnormalities are seen.  IMPRESSION: 1. No evidence of traumatic intracranial injury or fracture. 2.  No evidence  of fracture or subluxation along the cervical spine. 3. Mild cortical volume loss and scattered small vessel ischemic microangiopathy. 4. Minimal degenerative change at the upper cervical spine. 5. Sub-centimeter thyroid nodule(s) noted, too small to characterize, but most likely benign in the absence of known clinical risk factors for thyroid carcinoma. 6. Mild interstitial prominence at the lung apices.   Electronically Signed   By: Garald Balding M.D.   On: 12/12/2014 00:02   Dg Chest Port 1 View  12/13/2014   CLINICAL DATA:  Pulmonary edema  EXAM: PORTABLE CHEST - 1 VIEW  COMPARISON:  December 12, 2014  FINDINGS: There is less edema compared to 1 day prior. There is currently trace interstitial edema. There is subtle alveolar opacity in each lung base and right mid lung. Suspect edema, although small foci of pneumonia could present similarly. No new areas of opacity identified. Heart is mildly enlarged with mild pulmonary venous hypertension. No adenopathy. There are small cervical ribs bilaterally, larger on the left than on the right.  IMPRESSION: Less edema compared to 1 day prior. Scattered foci of alveolar opacity may represent either localized edema or pneumonia. Both entities may exist concurrently. No new opacity. No change in cardiac silhouette. The appearance of the pulmonary vasculature suggests a degree of volume overload.   Electronically Signed   By: Lowella Grip M.D.   On: 12/13/2014 07:03   Dg Chest Port 1 View  12/12/2014   CLINICAL DATA:  Acute onset of shortness of breath. Assess for flash pulmonary edema.  EXAM: PORTABLE CHEST - 1 VIEW  COMPARISON:  Chest radiograph from 12/11/2014  FINDINGS: The lungs are well-aerated. Mild right-sided airspace opacities could reflect mildly asymmetric interstitial edema. There is trace fluid along the right minor fissure. No pneumothorax is identified.  The cardiomediastinal silhouette is borderline normal in size. An external pacing pad is  noted overlying the left hemithorax. No acute osseous abnormalities are seen.  IMPRESSION: Mild right-sided airspace opacities could reflect mildly asymmetric interstitial edema. Trace fluid along the right minor fissure.   Electronically Signed   By: Garald Balding M.D.   On: 12/12/2014 04:55   Dg Chest Port 1 View  12/12/2014   CLINICAL DATA:  Status post cardiac arrest and resuscitation. Initial encounter.  EXAM: PORTABLE CHEST - 1 VIEW  COMPARISON:  Chest radiograph from 01/02/2014  FINDINGS: There is elevation of the right hemidiaphragm. There is no evidence of focal opacification, pleural effusion or pneumothorax.  The cardiomediastinal silhouette is borderline normal in size. No acute osseous abnormalities are seen. A small left cervical rib is incidentally noted.  IMPRESSION: 1. Elevation of the right hemidiaphragm; lungs remain grossly clear. 2. Small left cervical rib incidentally noted.   Electronically Signed   By: Garald Balding M.D.   On: 12/12/2014 00:04    Assessment:  Principal Problem:   Cardiac arrest Active Problems:   Essential hypertension   Diabetes mellitus with ESRD (end-stage renal disease)   NSVT (nonsustained ventricular tachycardia)   Chronic diastolic CHF (congestive heart failure)   Cardiomyopathy, ischemic-EF 30-35%   Blind   Unresponsive episode   Sustained VT (ventricular tachycardia)   Plan:  Continue IV amiodarone today. Have not been able to advance diet. Tolerated dialysis yesterday, still hypotensive, will not allow uptitration of CHF medications.  Time Spent Directly with Patient:  15 minutes  Length of Stay:  LOS: 2 days   Pixie Casino, MD, [facility] Attending Cardiologist [facility]  HILTY,Kenneth C  12/13/2014, 7:35 AM

## 2014-12-14 ENCOUNTER — Inpatient Hospital Stay (HOSPITAL_COMMUNITY): Payer: Medicare Other

## 2014-12-14 DIAGNOSIS — N189 Chronic kidney disease, unspecified: Secondary | ICD-10-CM

## 2014-12-14 DIAGNOSIS — H913 Deaf nonspeaking, not elsewhere classified: Secondary | ICD-10-CM

## 2014-12-14 DIAGNOSIS — D631 Anemia in chronic kidney disease: Secondary | ICD-10-CM

## 2014-12-14 DIAGNOSIS — J9601 Acute respiratory failure with hypoxia: Secondary | ICD-10-CM

## 2014-12-14 HISTORY — DX: Anemia in chronic kidney disease: D63.1

## 2014-12-14 HISTORY — DX: Chronic kidney disease, unspecified: N18.9

## 2014-12-14 LAB — CBC
HCT: 25 % — ABNORMAL LOW (ref 36.0–46.0)
Hemoglobin: 8 g/dL — ABNORMAL LOW (ref 12.0–15.0)
MCH: 31.3 pg (ref 26.0–34.0)
MCHC: 32 g/dL (ref 30.0–36.0)
MCV: 97.7 fL (ref 78.0–100.0)
Platelets: 296 10*3/uL (ref 150–400)
RBC: 2.56 MIL/uL — ABNORMAL LOW (ref 3.87–5.11)
RDW: 14.7 % (ref 11.5–15.5)
WBC: 8.9 10*3/uL (ref 4.0–10.5)

## 2014-12-14 LAB — RENAL FUNCTION PANEL
Albumin: 3.2 g/dL — ABNORMAL LOW (ref 3.5–5.2)
Anion gap: 17 — ABNORMAL HIGH (ref 5–15)
BUN: 41 mg/dL — ABNORMAL HIGH (ref 6–23)
CO2: 24 mEq/L (ref 19–32)
Calcium: 9 mg/dL (ref 8.4–10.5)
Chloride: 91 mEq/L — ABNORMAL LOW (ref 96–112)
Creatinine, Ser: 5.04 mg/dL — ABNORMAL HIGH (ref 0.50–1.10)
GFR calc Af Amer: 10 mL/min — ABNORMAL LOW (ref >90)
GFR calc non Af Amer: 9 mL/min — ABNORMAL LOW (ref >90)
Glucose, Bld: 110 mg/dL — ABNORMAL HIGH (ref 70–99)
Phosphorus: 4.6 mg/dL (ref 2.3–4.6)
Potassium: 5.3 mEq/L (ref 3.7–5.3)
Sodium: 132 mEq/L — ABNORMAL LOW (ref 137–147)

## 2014-12-14 LAB — BASIC METABOLIC PANEL
Anion gap: 14 (ref 5–15)
BUN: 24 mg/dL — ABNORMAL HIGH (ref 6–23)
CO2: 26 mEq/L (ref 19–32)
Calcium: 8.9 mg/dL (ref 8.4–10.5)
Chloride: 91 mEq/L — ABNORMAL LOW (ref 96–112)
Creatinine, Ser: 3.13 mg/dL — ABNORMAL HIGH (ref 0.50–1.10)
GFR calc Af Amer: 18 mL/min — ABNORMAL LOW (ref >90)
GFR calc non Af Amer: 15 mL/min — ABNORMAL LOW (ref >90)
Glucose, Bld: 200 mg/dL — ABNORMAL HIGH (ref 70–99)
Potassium: 4.6 mEq/L (ref 3.7–5.3)
Sodium: 131 mEq/L — ABNORMAL LOW (ref 137–147)

## 2014-12-14 LAB — GLUCOSE, CAPILLARY
Glucose-Capillary: 106 mg/dL — ABNORMAL HIGH (ref 70–99)
Glucose-Capillary: 109 mg/dL — ABNORMAL HIGH (ref 70–99)
Glucose-Capillary: 112 mg/dL — ABNORMAL HIGH (ref 70–99)
Glucose-Capillary: 120 mg/dL — ABNORMAL HIGH (ref 70–99)
Glucose-Capillary: 122 mg/dL — ABNORMAL HIGH (ref 70–99)

## 2014-12-14 MED ORDER — LIDOCAINE HCL (PF) 1 % IJ SOLN: 5.0000 mL | INTRAMUSCULAR | Status: DC | PRN

## 2014-12-14 MED ORDER — HEPARIN SODIUM (PORCINE) 1000 UNIT/ML DIALYSIS: 1000.0000 [IU] | INTRAMUSCULAR | Status: DC | PRN

## 2014-12-14 MED ORDER — NEPRO/CARBSTEADY PO LIQD: 237.0000 mL | ORAL | Status: DC | PRN

## 2014-12-14 MED ORDER — LIDOCAINE-PRILOCAINE 2.5-2.5 % EX CREA: 1.0000 "application " | TOPICAL_CREAM | CUTANEOUS | Status: DC | PRN

## 2014-12-14 MED ORDER — PENTAFLUOROPROP-TETRAFLUOROETH EX AERO: 1.0000 "application " | INHALATION_SPRAY | CUTANEOUS | Status: DC | PRN

## 2014-12-14 MED ORDER — DARBEPOETIN ALFA 60 MCG/0.3ML IJ SOSY
60.0000 ug | PREFILLED_SYRINGE | INTRAMUSCULAR | Status: DC
  Administered 2014-12-14: 60 ug via INTRAVENOUS
  Filled 2014-12-14: qty 0.3

## 2014-12-14 MED ORDER — HEPARIN SODIUM (PORCINE) 1000 UNIT/ML DIALYSIS
20.0000 [IU]/kg | INTRAMUSCULAR | Status: DC | PRN
Start: 2014-12-14 — End: 2014-12-15

## 2014-12-14 MED ORDER — SODIUM CHLORIDE 0.9 % IV SOLN: 100.0000 mL | INTRAVENOUS | Status: DC | PRN

## 2014-12-14 MED ORDER — HEPARIN SODIUM (PORCINE) 1000 UNIT/ML DIALYSIS
100.0000 [IU]/kg | Freq: Once | INTRAMUSCULAR | Status: DC
  Filled 2014-12-14: qty 9

## 2014-12-14 MED ORDER — DARBEPOETIN ALFA 60 MCG/0.3ML IJ SOSY
PREFILLED_SYRINGE | INTRAMUSCULAR | Status: AC
  Filled 2014-12-14: qty 0.3

## 2014-12-14 MED ORDER — ALTEPLASE 2 MG IJ SOLR: 2.0000 mg | Freq: Once | INTRAMUSCULAR | Status: DC | PRN

## 2014-12-14 MED ORDER — ALTEPLASE 2 MG IJ SOLR
2.0000 mg | Freq: Once | INTRAMUSCULAR | Status: AC | PRN
  Filled 2014-12-14: qty 2

## 2014-12-14 MED ORDER — SODIUM CHLORIDE 0.9 % IV SOLN
100.0000 mL | INTRAVENOUS | Status: DC | PRN
Start: 2014-12-14 — End: 2014-12-15

## 2014-12-14 MED ORDER — SODIUM CHLORIDE 0.9 % IV SOLN
125.0000 mg | INTRAVENOUS | Status: DC
  Administered 2014-12-14 – 2014-12-21 (×2): 125 mg via INTRAVENOUS
  Filled 2014-12-14 (×3): qty 10

## 2014-12-14 MED ORDER — LIDOCAINE-PRILOCAINE 2.5-2.5 % EX CREA
1.0000 "application " | TOPICAL_CREAM | CUTANEOUS | Status: DC | PRN
  Filled 2014-12-14: qty 5

## 2014-12-14 MED ORDER — HEPARIN SODIUM (PORCINE) 1000 UNIT/ML DIALYSIS
9000.0000 [IU] | Freq: Once | INTRAMUSCULAR | Status: DC
  Filled 2014-12-14: qty 9

## 2014-12-14 MED ORDER — NEPRO/CARBSTEADY PO LIQD
237.0000 mL | ORAL | Status: DC | PRN
  Filled 2014-12-14: qty 237

## 2014-12-14 NOTE — Progress Notes (Signed)
[facility]  [name] [id number] DOB: 1956-01-11 DOA: 12/11/2014 PCP: No primary care provider on file.  Brief narrative: 58 year old female patient w/ known chronic kidney disease on dialysis, hypertension, recent admission for anterior wall MI medically managed, anemia which was fecal occult blood negative 2, and deaf and blind since childhood who was sent over from the nursing home after being found unconscious after her dialysis treatment on Friday. Nursing home personnel examined the patient and found her to be pulseless and CPR was initiated.  An AED was applied and found a non-shockable rhythm. Patient had return of spontaneous circulation/pulse after ~ 6 minutes of chest compression and administration of intraosseous lidocaine. Before these events no apparent acute event or changes were reported by the nursing home staff.  Upon arrival to the emergency department she was alert and responsive. He was evaluated in the emergency department by cardiology. After arrival she developed ventricular tachycardia as well as acute dyspnea and hypertensive urgency. She was placed on BiPAP, given IV Lasix, IV nitroglycerin and IV amiodarone. It was suspected patient had a ventricular tach arrest prior to arrival. She stabilized and was able to be admitted to stepdown unit.  Shortly after admission to the nursing unit patient once again decompensated from a respiratory standpoint so critical care medicine was consulted and the patient was transferred to the ICU. Patient's respiratory symptoms responded to dialysis. Cardiology has documented that she is not a candidate for an AICD and recommended discussion with the family about DO NOT RESUSCITATE status since she is likely to have recurrent V. tach in the future. Cardiology has documented that up titration of cardiac meds has been difficult due to ongoing hypotension/soft blood pressures. Dialysis has been  accomplished without significant hypotension.  HPI/Subjective: Patient examined in dialysis and was sleeping. No apparent complaints.  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% -Suspect related to underlying cardiopathy with low EF -Not a candidate for AICD; need to have further discussions with patient's family - palliative evaluation to be requested 12/22 -Currently on IV amiodarone per cardiology -Continue Plavix    CKD/ESRD stage V requiring chronic dialysis -Appreciate nephrology assistance -Tolerating dialysis today without hypotension    Essential hypertension -Blood pressure had been soft but currently has rebounded -Consider reintroduction of cardiac medications    Acute respiratory failure with hypoxia due to flash pulmonary edema post arrest -Primarily treated with dialysis -Currently stable on room air   Pulmonary hypertension with moderate tricuspid regurgitation -Follow    Diabetes mellitus  -CBGs well-controlled -Continue sliding scale insulin      Leiomyoma of uterus -Has anemia but likely not related to fibroids    Hyperlipidemia -Continue statin    Deaf mutism, congenital -Communicates through sign language and assistance of family    Anemia in CKD  -Nephrology has ordered partial anemia panel -Continue iron replacement with dialysis -Continue Aranesp   Abnormal urinalysis -Appeared consistent with UTI but culture negative so discontinue Rocephin after 3 days of tx and follow  DVT prophylaxis: Subcutaneous heparin Code Status: Full Family Communication: No family at bedside Disposition Plan/Expected LOS: Stepdown  Consultants: Nephrology Cardiology PCCM  Procedures: 2-D echocardiogram: - Left ventricle: The cavity size was normal. Wall thickness was increased in a pattern of mild LVH. Systolic function was moderately to severely reduced. The estimated ejection fraction was in the range of  30% to 35%. There is severe hypokinesis of the mid-apicalanteroseptal and apical myocardium.  Doppler parameters are consistent with high ventricular filling pressure. - Mitral valve: Calcified annulus. There was mild regurgitation. - Left atrium: The atrium was moderately to severely dilated. - Tricuspid valve: There was moderate regurgitation. - Pulmonary arteries: PA peak pressure: 51 mm Hg (S).  Antibiotics: Rocephin 12/18 > 12/20  Objective: Blood pressure 147/64, pulse 84, temperature 98.4 F (36.9 C), temperature source Oral, resp. rate 13, height 5\' 6"  (1.676 m), weight 187 lb 13.3 oz (85.2 kg), SpO2 98 %.  Intake/Output Summary (Last 24 hours) at 12/14/14 1441 Last data filed at 12/14/14 1220  Gross per 24 hour  Intake  333.3 ml  Output   4000 ml  Net -3666.7 ml   Exam: Gen: No acute respiratory distress-sleeping during dialysis Chest: Clear to auscultation bilaterally without wheezes, rhonchi or crackles, room air Cardiac: Regular rate and rhythm, S1-S2, no rubs murmurs or gallops, no peripheral edema, no JVD Abdomen: Soft nontender nondistended without obvious hepatosplenomegaly, no ascites Extremities: Symmetrical in appearance without cyanosis, clubbing or effusion  Scheduled Meds:  Scheduled Meds: . antiseptic oral rinse  7 mL Mouth Rinse q12n4p  . aspirin  81 mg Oral Daily  . atorvastatin  10 mg Oral Daily  . calcium acetate  667 mg Oral TID WC  . cefTRIAXone (ROCEPHIN)  IV  1 g Intravenous Q24H  . clopidogrel  75 mg Oral Daily  . darbepoetin (ARANESP) injection - DIALYSIS  60 mcg Intravenous Q Mon-HD  . ferric gluconate (FERRLECIT/NULECIT) IV  125 mg Intravenous Q Mon-HD  . heparin  5,000 Units Subcutaneous 3 times per day  . insulin aspart  0-15 Units Subcutaneous Q6H  . risperiDONE microspheres  25 mg Intramuscular Q14 Days  . sodium chloride  3 mL Intravenous Q12H   Data Reviewed: Basic Metabolic Panel:  Recent Labs Lab 12/11/14 2333  12/11/14 2350 12/12/14 0330 12/12/14 0348 12/14/14 0825  NA 131* 135* 135* 134* 132*  K 3.7 3.7 4.4 4.1 5.3  CL 90* 92* 92* 95* 91*  CO2 28  --  28  --  24  GLUCOSE 275* 287* 271* 306* 110*  BUN 21 25* 23 24* 41*  CREATININE 2.90* 3.00* 2.91* 3.20* 5.04*  CALCIUM 8.8  --  9.2  --  9.0  MG 2.1  --   --   --   --   PHOS  --   --   --   --  4.6   Liver Function Tests:  Recent Labs Lab 12/11/14 2333 12/12/14 0330 12/14/14 0825  AST 79* 65*  --   ALT 68* 67*  --   ALKPHOS 82 93  --   BILITOT 0.3 0.3  --   PROT 7.5 8.4*  --   ALBUMIN 3.1* 3.6 3.2*    Recent Labs Lab 12/11/14 2333  LIPASE 50   CBC:  Recent Labs Lab 12/11/14 2333 12/11/14 2350 12/12/14 0330 12/12/14 0348 12/14/14 0824  WBC 7.7  --  12.4*  --  8.9  NEUTROABS 5.4  --  10.0*  --   --   HGB 9.6* 12.2 10.8* 12.2 8.0*  HCT 30.0* 36.0 33.1* 36.0 25.0*  MCV 98.0  --  99.7  --  97.7  PLT 335  --  387  --  296   Cardiac Enzymes:  Recent Labs Lab 12/12/14 0330 12/12/14 0910 12/12/14 1558 12/12/14 2317  CKTOTAL 108  --   --   --   CKMB 3.5  --   --   --  TROPONINI <0.30 <0.30 0.48* 0.37*   BNP (last 3 results)  Recent Labs  11/19/14 2335 12/12/14 0314  PROBNP 19448.0* 24864.0*   CBG:  Recent Labs Lab 12/13/14 1722 12/13/14 2330 12/14/14 0336 12/14/14 0646 12/14/14 1319  GLUCAP 112* 112* 122* 120* 106*    Recent Results (from the past 240 hour(s))  Culture, blood (routine x 2)     Status: None (Preliminary result)   Collection Time: 12/11/14 11:38 PM  Result Value Ref Range Status   Specimen Description BLOOD RIGHT ARM  Final   Special Requests BOTTLES DRAWN AEROBIC AND ANAEROBIC 5CC EACH  Final   Culture  Setup Time   Final    12/12/2014 02:51 Performed at [facility]    Culture   Final           BLOOD CULTURE RECEIVED NO GROWTH TO DATE CULTURE WILL BE HELD FOR 5 DAYS BEFORE ISSUING A FINAL NEGATIVE REPORT Performed at [facility]    Report Status  PENDING  Incomplete  Culture, blood (routine x 2)     Status: None (Preliminary result)   Collection Time: 12/11/14 11:40 PM  Result Value Ref Range Status   Specimen Description BLOOD RIGHT HAND  Final   Special Requests BOTTLES DRAWN AEROBIC AND ANAEROBIC 5CC EACH  Final   Culture  Setup Time   Final    12/12/2014 02:52 Performed at [facility]    Culture   Final           BLOOD CULTURE RECEIVED NO GROWTH TO DATE CULTURE WILL BE HELD FOR 5 DAYS BEFORE ISSUING A FINAL NEGATIVE REPORT Performed at [facility]    Report Status PENDING  Incomplete  Urine culture     Status: None   Collection Time: 12/12/14 12:49 AM  Result Value Ref Range Status   Specimen Description URINE, RANDOM  Final   Special Requests Normal  Final   Culture  Setup Time   Final    12/12/2014 06:28 Performed at [place] Performed at [facility]   Final   Culture NO GROWTH Performed at [facility]   Final   Report Status 12/13/2014 FINAL  Final     Studies:  Recent x-ray studies have been reviewed in detail by the Attending Physician  Time spent : 35 mins  Erin Hearing, ANP [facility] Office  [phone] Pager [phone]  On-Call/Text Page:      [email or web address]      password [email or web address]  If 7PM-7AM, please contact night-coverage [email or web address] Password [email or web address] 12/14/2014, 2:41 PM   LOS: 3 days   I have personally examined this patient and reviewed the entire database. I have reviewed the above note, made any necessary editorial changes, and agree with its content.  Cherene Altes, MD [facility]

## 2014-12-14 NOTE — Progress Notes (Signed)
Patient picked up for dialysis. Will assess pt upon arrival back to 2H06.

## 2014-12-14 NOTE — Care Management Note (Addendum)
    Page 1 of 1   12/16/2014     10:11:53 AM CARE MANAGEMENT NOTE 12/16/2014  Patient:  [name], [name]   Account Number:  [id number]  Date Initiated:  12/14/2014  Documentation initiated by:  Elissa Hefty  Subjective/Objective Assessment:   adm w cardiac arrest     Action[facility]Plan:   [facility]   Anticipated DC Date:     Anticipated DC Plan:  [place] referral  Clinical Social Worker         Choice offered to / List presented to:             Status of service:   Medicare Important Message given?  YES (If response is "NO", the following Medicare IM given date fields will be blank) Date Medicare IM given:  12/14/2014 Medicare IM given by:  Elissa Hefty Date Additional Medicare IM given:  12/16/2014 Additional Medicare IM given by:  [facility] Brietta Manso  Discharge Disposition:    Per UR Regulation:  Reviewed for med. necessity/level of care/duration of stay  If discussed at [place] of Stay Meetings, dates discussed:   12/15/2014  12/17/2014    Comments:

## 2014-12-14 NOTE — Procedures (Signed)
I have seen and examined this patient and agree with the plan of care. Possible cardiac arrest . Antihypertensive medications stopped due to hypotension. Cardiomyopathy with low EF   Tolerating fluid removal with dialysis  (clonidine, hydralazine, lisinopril and metoprolol being held )   [facility] W 12/14/2014, 9:09 AM

## 2014-12-14 NOTE — Progress Notes (Signed)
[facility] Progress Note  Assessment/Plan: 1. LOC episode / possible arrest - occurred at SNF, AED did not recognize VT/VF, got ~6 min CPR. Etiology may have been resp arrest d/t pulm edema and vol overload. She was here w similar thing last admission. 2. Hypotension - suspect this is all medication related, she was started on her home BP meds x 4 + Imdur. Suspect she doesn't take her meds at home and this is just overmedication for her. Have stopped all these meds, BP should come up. 3. Vol excess / pulm edema - still overloaded, a recurrent issue, will need aggressive UF in hospital again. Need to discuss fluid compliance w pt's family or whoever is in charge of her dietary regulation. BP too low for HD Sunday, plan HD today then again Tuesday per holiday schedule.  4. ESRD MWF - K 5.3 - extra tmt for volume on Tuesday - serially decrease volume; had f'gram in early November at [facility] - no indication for intervention - watch for cannulation issues tomorrow  5. CAD / hx MI / ischemic CM EF 35%; repeat Echo pending 6. DM2 - BS controlled low 100s 7. Anemia of CKD Hgb declining down to 8 - will change Aranesp to 60 and give today and check Fe stores tsat 36% last month; also check FOBT 8. Psych - hx of paranoid behavior 9. HTN - clon/ hydral / lisinopril / MTP on hold now--BP ok today 10. Increased LFT - may be liver congestion - repeat tomorrow with decreased volume  Myriam Jacobson, PA-C [facility] Beeper [phone] 12/14/2014,9:03 AM  LOS: 3 days   Subjective:   Unable to understand; HD RN difficult with pulling clots when cannulating   Objective Filed Vitals:   12/14/14 0719 12/14/14 0741 12/14/14 0753 12/14/14 0830  BP:  125/64 121/66 125/68  Pulse: 73 75 78 79  Temp:  98.6 F (37 C)    TempSrc:  Oral    Resp: 15 17 17 18   Height:      Weight:      SpO2: 97% 99% 99%    Physical Exam goal 4.5 L General:  NAD Heart: RRR 2/6 murmur Lungs: dim  BS Abdomen: soft NT Extremities: no LE edema, but dependent edema thighs/buttocks Dialysis Access: left upper AVF !b 300 VP ok  Dialysis Orders:  MWF [facility] 4h 68min 400/800 79kg 2/2.5 Bath LUA AVF Heparin 9000 Aranesp 40/wk, Venofer 100/wk  Hb 9.1, pth 202  Additional Objective Labs: Basic Metabolic Panel:  Recent Labs Lab 12/11/14 2333  12/12/14 0330 12/12/14 0348 12/14/14 0825  NA 131*  < > 135* 134* 132*  K 3.7  < > 4.4 4.1 5.3  CL 90*  < > 92* 95* 91*  CO2 28  --  28  --  24  GLUCOSE 275*  < > 271* 306* 110*  BUN 21  < > 23 24* 41*  CREATININE 2.90*  < > 2.91* 3.20* 5.04*  CALCIUM 8.8  --  9.2  --  9.0  PHOS  --   --   --   --  4.6  < > = values in this interval not displayed. Liver Function Tests:  Recent Labs Lab 12/11/14 2333 12/12/14 0330 12/14/14 0825  AST 79* 65*  --   ALT 68* 67*  --   ALKPHOS 82 93  --   BILITOT 0.3 0.3  --   PROT 7.5 8.4*  --   ALBUMIN 3.1* 3.6 3.2*  Recent Labs Lab 12/11/14 2333  LIPASE 50   CBC:  Recent Labs Lab 12/11/14 2333  12/12/14 0330 12/12/14 0348 12/14/14 0824  WBC 7.7  --  12.4*  --  8.9  NEUTROABS 5.4  --  10.0*  --   --   HGB 9.6*  < > 10.8* 12.2 8.0*  HCT 30.0*  < > 33.1* 36.0 25.0*  MCV 98.0  --  99.7  --  97.7  PLT 335  --  387  --  296  < > = values in this interval not displayed. Blood Culture    Component Value Date/Time   SDES URINE, RANDOM 12/12/2014 0049   SPECREQUEST Normal 12/12/2014 0049   CULT NO GROWTH Performed at [facility]  12/12/2014 0049   REPTSTATUS 12/13/2014 FINAL 12/12/2014 0049    Cardiac Enzymes:  Recent Labs Lab 12/12/14 0330 12/12/14 0910 12/12/14 1558 12/12/14 2317  CKTOTAL 108  --   --   --   CKMB 3.5  --   --   --   TROPONINI <0.30 <0.30 0.48* 0.37*   CBG:  Recent Labs Lab 12/13/14 0616 12/13/14 1158 12/13/14 1722 12/13/14 2330 12/14/14 0336  GLUCAP 125* 141* 112* 112* 122*   Studies/Results: Dg Chest Port 1  View  12/13/2014   CLINICAL DATA:  Pulmonary edema  EXAM: PORTABLE CHEST - 1 VIEW  COMPARISON:  December 12, 2014  FINDINGS: There is less edema compared to 1 day prior. There is currently trace interstitial edema. There is subtle alveolar opacity in each lung base and right mid lung. Suspect edema, although small foci of pneumonia could present similarly. No new areas of opacity identified. Heart is mildly enlarged with mild pulmonary venous hypertension. No adenopathy. There are small cervical ribs bilaterally, larger on the left than on the right.  IMPRESSION: Less edema compared to 1 day prior. Scattered foci of alveolar opacity may represent either localized edema or pneumonia. Both entities may exist concurrently. No new opacity. No change in cardiac silhouette. The appearance of the pulmonary vasculature suggests a degree of volume overload.   Electronically Signed   By: Lowella Grip M.D.   On: 12/13/2014 07:03   Medications: . sodium chloride 10 mL/hr at 12/12/14 0445  . amiodarone 30 mg/hr (12/14/14 0431)   . antiseptic oral rinse  7 mL Mouth Rinse q12n4p  . aspirin  81 mg Oral Daily  . atorvastatin  10 mg Oral Daily  . calcium acetate  667 mg Oral TID WC  . cefTRIAXone (ROCEPHIN)  IV  1 g Intravenous Q24H  . clopidogrel  75 mg Oral Daily  . darbepoetin (ARANESP) injection - DIALYSIS  60 mcg Intravenous Q Mon-HD  . ferric gluconate (FERRLECIT/NULECIT) IV  125 mg Intravenous Q Mon-HD  . heparin  5,000 Units Subcutaneous 3 times per day  . insulin aspart  0-15 Units Subcutaneous Q6H  . risperiDONE microspheres  25 mg Intramuscular Q14 Days  . sodium chloride  3 mL Intravenous Q12H

## 2014-12-15 ENCOUNTER — Encounter: Payer: Self-pay | Admitting: Internal Medicine

## 2014-12-15 DIAGNOSIS — I272 Pulmonary hypertension, unspecified: Secondary | ICD-10-CM | POA: Insufficient documentation

## 2014-12-15 DIAGNOSIS — D259 Leiomyoma of uterus, unspecified: Secondary | ICD-10-CM

## 2014-12-15 DIAGNOSIS — I071 Rheumatic tricuspid insufficiency: Secondary | ICD-10-CM

## 2014-12-15 DIAGNOSIS — D638 Anemia in other chronic diseases classified elsewhere: Secondary | ICD-10-CM

## 2014-12-15 DIAGNOSIS — N186 End stage renal disease: Secondary | ICD-10-CM | POA: Insufficient documentation

## 2014-12-15 DIAGNOSIS — J810 Acute pulmonary edema: Secondary | ICD-10-CM | POA: Insufficient documentation

## 2014-12-15 DIAGNOSIS — E785 Hyperlipidemia, unspecified: Secondary | ICD-10-CM

## 2014-12-15 DIAGNOSIS — E119 Type 2 diabetes mellitus without complications: Secondary | ICD-10-CM

## 2014-12-15 DIAGNOSIS — R8290 Unspecified abnormal findings in urine: Secondary | ICD-10-CM | POA: Insufficient documentation

## 2014-12-15 DIAGNOSIS — I270 Primary pulmonary hypertension: Secondary | ICD-10-CM

## 2014-12-15 DIAGNOSIS — Z992 Dependence on renal dialysis: Secondary | ICD-10-CM

## 2014-12-15 LAB — GLUCOSE, CAPILLARY
Glucose-Capillary: 116 mg/dL — ABNORMAL HIGH (ref 70–99)
Glucose-Capillary: 157 mg/dL — ABNORMAL HIGH (ref 70–99)
Glucose-Capillary: 180 mg/dL — ABNORMAL HIGH (ref 70–99)
Glucose-Capillary: 99 mg/dL (ref 70–99)

## 2014-12-15 LAB — BASIC METABOLIC PANEL
Anion gap: 14 (ref 5–15)
BUN: 32 mg/dL — ABNORMAL HIGH (ref 6–23)
CO2: 24 mmol/L (ref 19–32)
Calcium: 8.6 mg/dL (ref 8.4–10.5)
Chloride: 91 mEq/L — ABNORMAL LOW (ref 96–112)
Creatinine, Ser: 4.19 mg/dL — ABNORMAL HIGH (ref 0.50–1.10)
GFR calc Af Amer: 12 mL/min — ABNORMAL LOW (ref >90)
GFR calc non Af Amer: 11 mL/min — ABNORMAL LOW (ref >90)
Glucose, Bld: 118 mg/dL — ABNORMAL HIGH (ref 70–99)
Potassium: 4.5 mmol/L (ref 3.5–5.1)
Sodium: 129 mmol/L — ABNORMAL LOW (ref 135–145)

## 2014-12-15 LAB — CBC
HCT: 28.1 % — ABNORMAL LOW (ref 36.0–46.0)
Hemoglobin: 9.1 g/dL — ABNORMAL LOW (ref 12.0–15.0)
MCH: 31.7 pg (ref 26.0–34.0)
MCHC: 32.4 g/dL (ref 30.0–36.0)
MCV: 97.9 fL (ref 78.0–100.0)
Platelets: 274 10*3/uL (ref 150–400)
RBC: 2.87 MIL/uL — ABNORMAL LOW (ref 3.87–5.11)
RDW: 14.3 % (ref 11.5–15.5)
WBC: 7.2 10*3/uL (ref 4.0–10.5)

## 2014-12-15 LAB — MAGNESIUM: Magnesium: 2.1 mg/dL (ref 1.5–2.5)

## 2014-12-15 MED ORDER — LISINOPRIL 2.5 MG PO TABS
2.5000 mg | ORAL_TABLET | Freq: Every day | ORAL | Status: DC
  Administered 2014-12-15 – 2014-12-21 (×7): 2.5 mg via ORAL
  Filled 2014-12-15 (×7): qty 1

## 2014-12-15 MED ORDER — METOPROLOL SUCCINATE ER 50 MG PO TB24
50.0000 mg | ORAL_TABLET | Freq: Every day | ORAL | Status: DC
  Administered 2014-12-15 – 2014-12-21 (×7): 50 mg via ORAL
  Filled 2014-12-15 (×7): qty 1

## 2014-12-15 NOTE — Procedures (Signed)
I have seen and examined this patient and agree with the plan of care . Patient seen on dialysis extras session due to fluid overload [name] W 12/15/2014, 8:57 AM

## 2014-12-15 NOTE — Progress Notes (Signed)
   Was called to patient's bedside as she had a 60 beat run of NSVT lasting 10 seconds with a rate of 160 bpm. This is in the setting of ischemic cardiomyopathy with EF of 30-35%. K was WNL this am at 4.5. She remained hemodynamically stable during arrhthymia . She is currently in NSR. HR 88 bpm. BP is 143/58. Will continue IV amiodarone and metoprolol. Continue on telemetry. Will check Mg.   [name], [name] 12/15/2014

## 2014-12-15 NOTE — Progress Notes (Signed)
At 1326 pt had a 66 bt run of v-tach.  She was drinking water when it occurred.  Pt asymptomatic,  [place] PA notified.  No new orders received.  Will continue to closely monitor.

## 2014-12-15 NOTE — Progress Notes (Signed)
Patient ID: [name], female   DOB: [birth year], 58 y.o.   MRN: [id number]  Provider:  Rexene Edison. Mariea Clonts, D.O., C.M.D.  Location:  [facility]   PCP: No primary care provider on file.  Code Status: full code  No Known Allergies  Chief Complaint  Patient presents with  . 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 female with very poor vision, deafness, mute with h/o DMII, paranoid psychosis, htn, hyperlipidemia, chronic diastolic chf, ESRD on HD thru left AV fistula on m/w/f, SVT, heart murmur, fibroids, anemia, and prior cataract surgery with persistent poor vision was admitted here for [facility] term [facility].    ROS: Review of Systems  Unable to perform ROS    Past Medical History  Diagnosis Date  . Hyperlipidemia   . Hypertension     a. Has previously refused blood pressure meds.   . Deaf     Divorced from husband but lives with him. Has daughter but she does not care for her.  . Bilateral leg edema     a. Chronic.  Marland Kitchen Psychiatric disorder     She frequently exhibits paranoia and has been diagnosed with psychotic d/o NOS during hospital stay in the past. She is tangetial and perseverative during her visits. She apparently has had a bad experience with mental health in [place] in the past and refuses to discuss mental issues for fear that she will be sent back there. Her paranoia, communication issues, financial woes and lack of fam  . Fibroid uterus   . Anemia     Due to fibroids.  . Heart murmur   . Diabetes mellitus     a. Per PCP note 2012 (A1C 9.2) - pt unwilling to take meds and was educated on risk of uncontrolled DM.  b. A1C 5.9 in 11/2013.  Marland Kitchen Renal disorder   . Coronary artery disease   . MI (myocardial infarction)   . Renal insufficiency    Past Surgical History  Procedure Laterality Date  . No past surgeries    . Insertion of dialysis  catheter Right 01/02/2014    Procedure: INSERTION OF DIALYSIS CATHETER;  Surgeon: Rosetta Posner, MD;  Location: [place];  Service: Vascular;  Laterality: Right;  . Av fistula placement Left 01/02/2014    Procedure: ARTERIOVENOUS (AV) FISTULA CREATION;  Surgeon: Rosetta Posner, MD;  Location: [place];  Service: Vascular;  Laterality: Left;   Social History:   reports that she has never smoked. She does not have any smokeless tobacco history on file. She reports that she does not drink alcohol or use illicit drugs.  Family History  Problem Relation Age of Onset  . Other Father     Drowned from fishing?  . Heart disease      Medications: Patient's Medications  New Prescriptions   CLONIDINE (CATAPRES - DOSED IN MG/24 HR) 0.1 MG/24HR PATCH    Place 1 patch (0.1 mg total) onto the skin once a week. For this week, then off.   CLOPIDOGREL (PLAVIX) 75 MG TABLET    Take 1 tablet (75 mg total) by mouth daily.   LISINOPRIL (PRINIVIL,ZESTRIL) 2.5 MG TABLET    Take 1 tablet (2.5 mg total) by mouth daily.   METOPROLOL SUCCINATE (TOPROL-XL) 50 MG 24 HR TABLET    Take 1 tablet (50 mg  total) by mouth daily. Take with or immediately following a meal.   NITROGLYCERIN (NITROSTAT) 0.4 MG SL TABLET    Place 1 tablet (0.4 mg total) under the tongue every 5 (five) minutes as needed for chest pain.  Previous Medications   ASPIRIN 81 MG TABLET    Take 81 mg by mouth daily. For hypertension   ATORVASTATIN (LIPITOR) 10 MG TABLET    Take 10 mg by mouth daily.   BISACODYL (DULCOLAX) 10 MG SUPPOSITORY    Place 1 suppository (10 mg total) rectally daily as needed for mild constipation, moderate constipation or severe constipation.   CALCIUM ACETATE (PHOSLO) 667 MG CAPSULE    Take 1 capsule (667 mg total) by mouth 3 (three) times daily with meals.   DARBEPOETIN (ARANESP) 100 MCG/0.5ML SOLN INJECTION    Inject 0.5 mLs (100 mcg total) into the skin every Tuesday at 6 PM.   HYDRALAZINE (APRESOLINE) 100 MG TABLET    Take 1 tablet (100  mg total) by mouth every 8 (eight) hours.   LACTULOSE (CHRONULAC) 10 GM/15ML SOLUTION    Take 30 mLs (20 g total) by mouth 2 (two) times daily as needed for moderate constipation.   LEVALBUTEROL (XOPENEX) 0.63 MG/3ML NEBULIZER SOLUTION    Take 3 mLs (0.63 mg total) by nebulization every 2 (two) hours as needed for wheezing or shortness of breath.   MULTIPLE VITAMIN (MULTIVITAMIN WITH MINERALS) TABS TABLET    Take 1 tablet by mouth daily.   MULTIVITAMIN (RENA-VIT) TABS TABLET    Take 1 tablet by mouth at bedtime.   NAPHAZOLINE-PHENIRAMINE (NAPHCON-A) 0.025-0.3 % OPHTHALMIC SOLUTION    Place 1 drop into both eyes 4 (four) times daily as needed for irritation.   PROMETHAZINE (PHENERGAN) 12.5 MG SUPPOSITORY    Place 12.5 mg rectally every 4 (four) hours as needed for nausea or vomiting.   RISPERIDONE MICROSPHERES (RISPERDAL CONSTA) 25 MG INJECTION    Inject 25 mg into the muscle every 14 (fourteen) days. For psychosis  Modified Medications   Modified Medication Previous Medication   DOCUSATE SODIUM (DSS) 100 MG CAPS docusate sodium 100 MG CAPS      Take 100 mg by mouth 2 (two) times daily. For constipation    Take 100 mg by mouth 2 (two) times daily.   ISOSORBIDE MONONITRATE (IMDUR) 60 MG 24 HR TABLET isosorbide mononitrate (IMDUR) 30 MG 24 hr tablet      Take 1 tablet (60 mg total) by mouth daily.    Take 1 tablet (30 mg total) by mouth daily.  Discontinued Medications   CLONIDINE (CATAPRES - DOSED IN MG/24 HR) 0.2 MG/24HR PATCH    Place 0.2 mg onto the skin once a week. For hypertension   CLONIDINE (CATAPRES) 0.1 MG TABLET    Take 1 tablet (0.1 mg total) by mouth 3 (three) times daily.   DIPHENHYDRAMINE (BENADRYL) 25 MG CAPSULE    Take 25 mg by mouth every 6 (six) hours as needed.   METOPROLOL (LOPRESSOR) 100 MG TABLET    Take 1 tablet (100 mg total) by mouth 2 (two) times daily.   SIMVASTATIN (ZOCOR) 40 MG TABLET    20 mg. Give 1/2 tablet =20 mg by mouth daily for hyperlipidemia.   TRAMADOL  (ULTRAM) 50 MG TABLET    Take 50 mg by mouth every 6 (six) hours as needed.     Physical Exam: Physical Exam  Constitutional: No distress.  HENT:  Head: Normocephalic and atraumatic.  Cardiovascular: Normal rate, regular rhythm, normal  heart sounds and intact distal pulses.   Left av fistula with normal thrill and pulse  Pulmonary/Chest: Effort normal and breath sounds normal. She has no rales.  Abdominal: Soft. Bowel sounds are normal. She exhibits no distension and no mass. There is no tenderness.  Musculoskeletal: Normal range of motion.  Neurological: She is alert.  Skin: Skin is warm and dry.     Labs reviewed: Basic Metabolic Panel:  Recent Labs  12/24/13 1315  11/23/14 0906 11/24/14 0325  12/11/14 2333  12/14/14 0825 12/14/14 2009 12/15/14 0700  NA  --   < > 125* 132*  < > 131*  < > 132* 131* 129*  K  --   < > 5.4* 4.4  < > 3.7  < > 5.3 4.6 4.5  CL  --   < > 84* 92*  < > 90*  < > 91* 91* 91*  CO2  --   < > 23 25  < > 28  < > 24 26 24   GLUCOSE  --   < > 136* 99  < > 275*  < > 110* 200* 118*  BUN  --   < > 78* 40*  < > 21  < > 41* 24* 32*  CREATININE  --   < > 7.02* 4.51*  < > 2.90*  < > 5.04* 3.13* 4.19*  CALCIUM  --   < > 8.9 8.7  < > 8.8  < > 9.0 8.9 8.6  MG 2.0  --   --   --   --  2.1  --   --   --   --   PHOS  --   < > 7.1* 6.0*  --   --   --  4.6  --   --   < > = values in this interval not displayed. Liver Function Tests:  Recent Labs  11/23/14 0329  12/11/14 2333 12/12/14 0330 12/14/14 0825  AST 12  --  79* 65*  --   ALT 26  --  68* 67*  --   ALKPHOS 96  --  82 93  --   BILITOT 0.4  --  0.3 0.3  --   PROT 7.4  --  7.5 8.4*  --   ALBUMIN 3.1*  3.2*  < > 3.1* 3.6 3.2*  < > = values in this interval not displayed.  Recent Labs  12/11/14 2333  LIPASE 50   No results for input(s): AMMONIA in the last 8760 hours. CBC:  Recent Labs  12/25/13 0553  12/11/14 2333  12/12/14 0330 12/12/14 0348 12/14/14 0824 12/15/14 0800  WBC 7.6  < > 7.7   --  12.4*  --  8.9 7.2  NEUTROABS 3.6  --  5.4  --  10.0*  --   --   --   HGB 8.7*  < > 9.6*  < > 10.8* 12.2 8.0* 9.1*  HCT 26.3*  < > 30.0*  < > 33.1* 36.0 25.0* 28.1*  MCV 90.4  < > 98.0  --  99.7  --  97.7 97.9  PLT 311  < > 335  --  387  --  296 274  < > = values in this interval not displayed. Cardiac Enzymes:  Recent Labs  12/12/14 0330 12/12/14 0910 12/12/14 1558 12/12/14 2317  CKTOTAL 108  --   --   --   CKMB 3.5  --   --   --   TROPONINI <0.30 <0.30 0.48*  0.37*   BNP: Invalid input(s): POCBNP CBG:  Recent Labs  12/14/14 2354 12/15/14 0506 12/15/14 1335  GLUCAP 99 116* 180*    Assessment/Plan 1. Chronic diastolic CHF (congestive heart failure) -cont to monitor weights and volume status -get HD tiw  2. End stage renal disease -cont hd tiw  3. SVT (supraventricular tachycardia) -monitor pulse  4. DM type 2 causing CKD stage 4 -cont statin, concho diet  5. Anemia in chronic kidney disease -cont treatment as per renal -also has secondary hyperparathyroidism on renavite, phoslo  6. Chronic paranoid psychosis -cont risperdal consta injection q 14 days  Functional status: dependent ADLS  Family/ staff Communication: discussed with nurse  Labs/tests ordered:  Full panel labs

## 2014-12-15 NOTE — Progress Notes (Signed)
[facility]  ASANTE VINOKUR [id number] DOB: 05/16/1956 DOA: 12/11/2014 PCP: No primary care provider on file.  Brief narrative: 58 year old BF PMHx chronic kidney disease on dialysis, hypertension, recent admission for anterior wall MI medically managed, anemia which was fecal occult blood negative 2, and deaf and blind since childhood who was sent over from the nursing home after being found unconscious after her dialysis treatment on Friday. Nursing home personnel examined the patient and found her to be pulseless and CPR was initiated.  An AED was applied and found a non-shockable rhythm. Patient had return of spontaneous circulation/pulse after ~ 6 minutes of chest compression and administration of intraosseous lidocaine. Before these events no apparent acute event or changes were reported by the nursing home staff.  Upon arrival to the emergency department she was alert and responsive. He was evaluated in the emergency department by cardiology. After arrival she developed ventricular tachycardia as well as acute dyspnea and hypertensive urgency. She was placed on BiPAP, given IV Lasix, IV nitroglycerin and IV amiodarone. It was suspected patient had a ventricular tach arrest prior to arrival. She stabilized and was able to be admitted to stepdown unit.  Shortly after admission to the nursing unit patient once again decompensated from a respiratory standpoint so critical care medicine was consulted and the patient was transferred to the ICU. Patient's respiratory symptoms responded to dialysis. Cardiology has documented that she is not a candidate for an AICD and recommended discussion with the family about DO NOT RESUSCITATE status since she is likely to have recurrent V. tach in the future. Cardiology has documented that up titration of cardiac meds has been difficult due to ongoing hypotension/soft blood pressures. Dialysis has been accomplished without  significant hypotension.  HPI/Subjective: Patient examined in dialysis, awake attempting to communicate  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% -Suspect related to underlying cardiopathy with low EF -Not a candidate for AICD; 12/22 have contacted palliative medicine to begin discussions with patient and family utilizing sign language interpreter -Currently on IV amiodarone per cardiology -Continue Plavix    CKD/ESRD stage V requiring chronic dialysis -Has tolerated dialysis yesterday and today    Essential hypertension -Blood pressure had been soft but currently has rebounded -12/22 Will resume home doses of lisinopril and Lopressor -Preadmission hydralazine and Catapres remain on hold    Acute respiratory failure with hypoxia due to flash pulmonary edema post arrest -Primarily treated with dialysis -Currently stable on room air   Pulmonary hypertension with moderate tricuspid regurgitation -Follow    Diabetes mellitus  -CBGs well-controlled -Continue sliding scale insulin -Was not on insulin or oral hypoglycemic agent prior to admission      Leiomyoma of uterus -Has anemia but likely not related to fibroids    Hyperlipidemia -Continue statin    Deaf mutism, congenital -Communicates through sign language and assistance of family    Anemia in CKD  -Nephrology has ordered partial anemia panel -Continue iron replacement with dialysis -Continue Aranesp   Abnormal urinalysis -Appeared consistent with UTI but culture negative so discontinue Rocephin after 3 days of tx and follow    DVT prophylaxis: Subcutaneous heparin Code Status: Full Family Communication: Spoke with Delene Loll (daughter) [phone]; answered all questions. Disposition Plan/Expected LOS: Stepdown-consider transfer to telemetry on 12/23 if patient tolerates addition of antihypertensive medications  Consultants: Nephrology/Dr. Edrick Oh Cardiology/Dr.  Lyman Bishop PCCM/Dr. Baltazar Apo  Procedures: 2-D echocardiogram: - Left ventricle:  mild LVH.-  LVEF= 30% to 35%. There is severe hypokinesis of mid-apicalanteroseptal and apical myocardium.  - Left atrium:  moderately to severely dilated. - Tricuspid valve: moderate regurgitation. - Pulmonary arteries: PA peak pressure: 51 mm Hg (S).   Antibiotics: Rocephin 12/18 > 12/20  Objective: Blood pressure 164/88, pulse 84, temperature 98.1 F (36.7 C), temperature source Oral, resp. rate 16, height 5\' 6"  (1.676 m), weight 175 lb 0.7 oz (79.4 kg), SpO2 99 %.  Intake/Output Summary (Last 24 hours) at 12/15/14 1402 Last data filed at 12/15/14 1230  Gross per 24 hour  Intake  703.5 ml  Output   3118 ml  Net -2414.5 ml   Exam: Gen: No acute respiratory distress Chest: Clear to auscultation bilaterally without wheezes, rhonchi or crackles, room air Cardiac: Regular rate and rhythm, S1-S2, no rubs murmurs or gallops, no peripheral edema, no JVD; moderately hypertensive Abdomen: Soft nontender nondistended without obvious hepatosplenomegaly, no ascites Extremities: Symmetrical in appearance without cyanosis, clubbing or effusion, left hand has some thenar atrophy and notable pain with passive range of motion to left thumb  Scheduled Meds:  Scheduled Meds: . antiseptic oral rinse  7 mL Mouth Rinse q12n4p  . aspirin  81 mg Oral Daily  . atorvastatin  10 mg Oral Daily  . calcium acetate  667 mg Oral TID WC  . clopidogrel  75 mg Oral Daily  . darbepoetin (ARANESP) injection - DIALYSIS  60 mcg Intravenous Q Mon-HD  . ferric gluconate (FERRLECIT/NULECIT) IV  125 mg Intravenous Q Mon-HD  . heparin  5,000 Units Subcutaneous 3 times per day  . insulin aspart  0-15 Units Subcutaneous Q6H  . risperiDONE microspheres  25 mg Intramuscular Q14 Days  . sodium chloride  3 mL Intravenous Q12H   Data Reviewed: Basic Metabolic Panel:  Recent Labs Lab 12/11/14 2333  12/12/14 0330 12/12/14 0348  12/14/14 0825 12/14/14 2009 12/15/14 0700  NA 131*  < > 135* 134* 132* 131* 129*  K 3.7  < > 4.4 4.1 5.3 4.6 4.5  CL 90*  < > 92* 95* 91* 91* 91*  CO2 28  --  28  --  24 26 24   GLUCOSE 275*  < > 271* 306* 110* 200* 118*  BUN 21  < > 23 24* 41* 24* 32*  CREATININE 2.90*  < > 2.91* 3.20* 5.04* 3.13* 4.19*  CALCIUM 8.8  --  9.2  --  9.0 8.9 8.6  MG 2.1  --   --   --   --   --   --   PHOS  --   --   --   --  4.6  --   --   < > = values in this interval not displayed. Liver Function Tests:  Recent Labs Lab 12/11/14 2333 12/12/14 0330 12/14/14 0825  AST 79* 65*  --   ALT 68* 67*  --   ALKPHOS 82 93  --   BILITOT 0.3 0.3  --   PROT 7.5 8.4*  --   ALBUMIN 3.1* 3.6 3.2*    Recent Labs Lab 12/11/14 2333  LIPASE 50   CBC:  Recent Labs Lab 12/11/14 2333 12/11/14 2350 12/12/14 0330 12/12/14 0348 12/14/14 0824 12/15/14 0800  WBC 7.7  --  12.4*  --  8.9 7.2  NEUTROABS 5.4  --  10.0*  --   --   --   HGB 9.6* 12.2 10.8* 12.2 8.0* 9.1*  HCT 30.0* 36.0 33.1* 36.0 25.0* 28.1*  MCV 98.0  --  99.7  --  97.7 97.9  PLT 335  --  387  --  296 274   Cardiac Enzymes:  Recent Labs Lab 12/12/14 0330 12/12/14 0910 12/12/14 1558 12/12/14 2317  CKTOTAL 108  --   --   --   CKMB 3.5  --   --   --   TROPONINI <0.30 <0.30 0.48* 0.37*   BNP (last 3 results)  Recent Labs  11/19/14 2335 12/12/14 0314  PROBNP 19448.0* 24864.0*   CBG:  Recent Labs Lab 12/14/14 1319 12/14/14 1720 12/14/14 2354 12/15/14 0506 12/15/14 1335  GLUCAP 106* 109* 99 116* 180*    Recent Results (from the past 240 hour(s))  Culture, blood (routine x 2)     Status: None (Preliminary result)   Collection Time: 12/11/14 11:38 PM  Result Value Ref Range Status   Specimen Description BLOOD RIGHT ARM  Final   Special Requests BOTTLES DRAWN AEROBIC AND ANAEROBIC 5CC EACH  Final   Culture  Setup Time   Final    12/12/2014 02:51 Performed at [facility]    Culture   Final           BLOOD  CULTURE RECEIVED NO GROWTH TO DATE CULTURE WILL BE HELD FOR 5 DAYS BEFORE ISSUING A FINAL NEGATIVE REPORT Performed at [facility]    Report Status PENDING  Incomplete  Culture, blood (routine x 2)     Status: None (Preliminary result)   Collection Time: 12/11/14 11:40 PM  Result Value Ref Range Status   Specimen Description BLOOD RIGHT HAND  Final   Special Requests BOTTLES DRAWN AEROBIC AND ANAEROBIC 5CC EACH  Final   Culture  Setup Time   Final    12/12/2014 02:52 Performed at [facility]    Culture   Final           BLOOD CULTURE RECEIVED NO GROWTH TO DATE CULTURE WILL BE HELD FOR 5 DAYS BEFORE ISSUING A FINAL NEGATIVE REPORT Performed at [facility]    Report Status PENDING  Incomplete  Urine culture     Status: None   Collection Time: 12/12/14 12:49 AM  Result Value Ref Range Status   Specimen Description URINE, RANDOM  Final   Special Requests Normal  Final   Culture  Setup Time   Final    12/12/2014 06:28 Performed at [facility] Performed at [facility]   Final   Culture NO GROWTH Performed at [facility]   Final   Report Status 12/13/2014 FINAL  Final     Studies:  Recent x-ray studies have been reviewed in detail by the Attending Physician  Time spent : 35 mins  Erin Hearing, ANP [facility] Office  [phone] Pager [phone]  On-Call/Text Page:      [email or web address]      password [email or web address]  If 7PM-7AM, please contact night-coverage [email or web address] Password [email or web address] 12/15/2014, 2:02 PM   LOS: 4 days    Examined Patient and discussed A&P with ANP Ebony Hail and agree with above plan.  I have reviewed the entire database. I have made any necessary editorial changes, and agree with its content. Pt with Multiple Complex medical problems> 50 min spent in direct Pt care    Dia Crawford, MD [facility] [phone] pager

## 2014-12-15 NOTE — Progress Notes (Signed)
Called by nursing regarding episode of [place] NSVT 60 beats around 1:36pm today. I asked [place] to see the patient, she appears to be asymptomatic back then. I have reviewed her telemetry again, longest episode of VT since 1:36pm was a 5 beats run around 3pm. NSR with PVCs since then. Currently on metoprolol and IV amiodarone. ?if related to electrolyte and volume shift during HD today. Labs checked, K and Mg all WNL. Continue to monitor for now.   Hilbert Corrigan PA Pager: [phone]

## 2014-12-15 NOTE — Progress Notes (Signed)
Palliative Medicine Team  Palliative consult received and chart reviewed. We will need to arrange for a sign language interpretor to be present for our consultation. We will contact unit in AM and notify family of when this service and a palliative provider is available. If there are urgent needs please call [phone].  Lane Hacker, DO Palliative Medicine

## 2014-12-16 DIAGNOSIS — Z992 Dependence on renal dialysis: Secondary | ICD-10-CM

## 2014-12-16 DIAGNOSIS — H9190 Unspecified hearing loss, unspecified ear: Secondary | ICD-10-CM

## 2014-12-16 DIAGNOSIS — F489 Nonpsychotic mental disorder, unspecified: Secondary | ICD-10-CM

## 2014-12-16 DIAGNOSIS — H547 Unspecified visual loss: Secondary | ICD-10-CM

## 2014-12-16 DIAGNOSIS — Z515 Encounter for palliative care: Secondary | ICD-10-CM | POA: Diagnosis not present

## 2014-12-16 DIAGNOSIS — Z7189 Other specified counseling: Secondary | ICD-10-CM

## 2014-12-16 DIAGNOSIS — Z7382 Dual sensory impairment: Secondary | ICD-10-CM | POA: Diagnosis not present

## 2014-12-16 DIAGNOSIS — H540 Blindness, both eyes: Secondary | ICD-10-CM | POA: Diagnosis not present

## 2014-12-16 LAB — GLUCOSE, CAPILLARY
Glucose-Capillary: 134 mg/dL — ABNORMAL HIGH (ref 70–99)
Glucose-Capillary: 144 mg/dL — ABNORMAL HIGH (ref 70–99)
Glucose-Capillary: 149 mg/dL — ABNORMAL HIGH (ref 70–99)
Glucose-Capillary: 153 mg/dL — ABNORMAL HIGH (ref 70–99)
Glucose-Capillary: 156 mg/dL — ABNORMAL HIGH (ref 70–99)

## 2014-12-16 LAB — BASIC METABOLIC PANEL
Anion gap: 10 (ref 5–15)
BUN: 24 mg/dL — ABNORMAL HIGH (ref 6–23)
CO2: 28 mmol/L (ref 19–32)
Calcium: 8.6 mg/dL (ref 8.4–10.5)
Chloride: 94 mEq/L — ABNORMAL LOW (ref 96–112)
Creatinine, Ser: 3.74 mg/dL — ABNORMAL HIGH (ref 0.50–1.10)
GFR calc Af Amer: 14 mL/min — ABNORMAL LOW (ref >90)
GFR calc non Af Amer: 12 mL/min — ABNORMAL LOW (ref >90)
Glucose, Bld: 137 mg/dL — ABNORMAL HIGH (ref 70–99)
Potassium: 4.2 mmol/L (ref 3.5–5.1)
Sodium: 132 mmol/L — ABNORMAL LOW (ref 135–145)

## 2014-12-16 MED ORDER — AMIODARONE HCL 200 MG PO TABS
400.0000 mg | ORAL_TABLET | Freq: Every day | ORAL | Status: DC
  Administered 2014-12-16 – 2014-12-21 (×6): 400 mg via ORAL
  Filled 2014-12-16 (×6): qty 2

## 2014-12-16 MED ORDER — PRO-STAT SUGAR FREE PO LIQD
30.0000 mL | Freq: Three times a day (TID) | ORAL | Status: DC
  Administered 2014-12-16 – 2014-12-21 (×12): 30 mL via ORAL
  Filled 2014-12-16 (×17): qty 30

## 2014-12-16 NOTE — Progress Notes (Signed)
I left a message for [name]'s son - she is blind (not sure to what extent - she would not open eyes for me). Will await to hear back from him to better assess how we may be able to communicate with her, if at all. Unsure of orientation at this time. Will await call back from son.  Vinie Sill, NP Palliative Medicine Team Pager # [phone] (M-F 8a-5p) Team Phone # [phone] (Nights/Weekends)

## 2014-12-16 NOTE — Progress Notes (Signed)
Full note to follow:  I met today with [name] son and POA, [name], and with her sister, [name], over the phone. Both are very involved with [name] care. I spent much time explaining the gravity of her condition and the limited options we have for intervention and the concern that this will likely happen again - with worse outcomes. We discussed code status and the importance of considering quality of life. They explain that quality of life is very poor and that she has no interaction or communication at [facility]. They are very frustrated with what they describe is a lack of compassion and caring. [name] is very tearful throughout the conversation as he tells me that he father is near end of life with hospice care and he didn't think his mother would be "this bad off too." I do think he understands and I encourage him to consider what we are asking [name] to go through and for what quality of life. He tells me that he wishes her to continue aggressive care and anything we can offer her at this point and wishes full code. He does tell me that he understands that his parents do not have [name] but is overwhelmed with the responsibility of dealing with both their health issues. He says his sister is no help and it is all on him - he greatly appreciates the support of [name]. I encourage him to communicate with his mother for her wishes. Unfortunately they have developed their own form of sign language that she can feel his hands and understand which limits our ability to truly communicate with her other than through him. I will continue to follow and explore any options to help improve her quality of life as [place] as she may have.   Vinie Sill, NP Palliative Medicine Team Pager # [phone] (M-F 8a-5p) Team Phone # [phone] (Nights/Weekends)

## 2014-12-16 NOTE — Progress Notes (Signed)
DAILY PROGRESS NOTE  Subjective:  Sent to the floor - back to the ICU yesterday for what sounds like sustained VT. No NSVT overnight on telemetry. On amiodarone. Not an ICD candidate. Palliative care has been consulted for goals of care.  Objective:  Temp:  [97.4 F (36.3 C)-99.1 F (37.3 C)] 99.1 F (37.3 C) (12/23 0719) Pulse Rate:  [70-93] 70 (12/23 0800) Resp:  [14-20] 16 (12/23 0800) BP: (108-165)/(53-88) 108/58 mmHg (12/23 0800) SpO2:  [99 %-100 %] 99 % (12/23 0800) Weight:  [175 lb 0.7 oz (79.4 kg)-180 lb 8.9 oz (81.9 kg)] 180 lb 8.9 oz (81.9 kg) (12/23 0400) Weight change: 1 lb 15.8 oz (0.9 kg)  Intake/Output from previous day: 12/22 0701 - 12/23 0700 In: 1631.1 [P.O.:1080; I.V.:551.1] Out: 3118   Intake/Output from this shift:    Medications: Current Facility-Administered Medications  Medication Dose Route Frequency Provider Last Rate Last Dose  . 0.9 %  sodium chloride infusion   Intravenous Continuous Berle Mull, MD 10 mL/hr at 12/12/14 0445    . acetaminophen (TYLENOL) tablet 650 mg  650 mg Oral Q6H PRN Berle Mull, MD       Or  . acetaminophen (TYLENOL) suppository 650 mg  650 mg Rectal Q6H PRN Berle Mull, MD      . albuterol (PROVENTIL) (2.5 MG/3ML) 0.083% nebulizer solution 2.5 mg  2.5 mg Nebulization Q4H PRN Berle Mull, MD      . amiodarone (NEXTERONE PREMIX) 360 MG/200ML (1.8 mg/mL) IV infusion  30 mg/hr Intravenous Continuous Berle Mull, MD 16.7 mL/hr at 12/16/14 0351 30 mg/hr at 12/16/14 0351  . antiseptic oral rinse (CPC / CETYLPYRIDINIUM CHLORIDE 0.05%) solution 7 mL  7 mL Mouth Rinse q12n4p Berle Mull, MD   7 mL at 12/15/14 1600  . aspirin chewable tablet 81 mg  81 mg Oral Daily Berle Mull, MD   81 mg at 12/15/14 1325  . atorvastatin (LIPITOR) tablet 10 mg  10 mg Oral Daily Berle Mull, MD   10 mg at 12/15/14 1326  . bisacodyl (DULCOLAX) suppository 10 mg  10 mg Rectal Daily PRN Berle Mull, MD   10 mg at 12/15/14 1838  . calcium  acetate (PHOSLO) capsule 667 mg  667 mg Oral TID WC Berle Mull, MD   667 mg at 12/15/14 1654  . clopidogrel (PLAVIX) tablet 75 mg  75 mg Oral Daily Berle Mull, MD   75 mg at 12/15/14 1326  . Darbepoetin Alfa (ARANESP) injection 60 mcg  60 mcg Intravenous Q Mon-HD Myriam Jacobson, PA-C   60 mcg at 12/14/14 0912  . ferric gluconate (NULECIT) 125 mg in sodium chloride 0.9 % 100 mL IVPB  125 mg Intravenous Q Mon-HD Myriam Jacobson, PA-C   125 mg at 12/14/14 1100  . heparin injection 5,000 Units  5,000 Units Subcutaneous 3 times per day Berle Mull, MD   5,000 Units at 12/16/14 0553  . insulin aspart (novoLOG) injection 0-15 Units  0-15 Units Subcutaneous Q6H Collene Gobble, MD   2 Units at 12/16/14 [phone]  . levalbuterol (XOPENEX) nebulizer solution 0.63 mg  0.63 mg Nebulization Q2H PRN Berle Mull, MD      . lisinopril (PRINIVIL,ZESTRIL) tablet 2.5 mg  2.5 mg Oral Daily Samella Parr, NP   2.5 mg at 12/15/14 1522  . metoprolol succinate (TOPROL-XL) 24 hr tablet 50 mg  50 mg Oral Daily Samella Parr, NP   50 mg at 12/15/14 1522  . nitroGLYCERIN (NITROSTAT) SL tablet  0.4 mg  0.4 mg Sublingual Q5 min PRN Berle Mull, MD      . ondansetron (ZOFRAN) tablet 4 mg  4 mg Oral Q6H PRN Berle Mull, MD       Or  . ondansetron (ZOFRAN) injection 4 mg  4 mg Intravenous Q6H PRN Berle Mull, MD      . promethazine (PHENERGAN) suppository 12.5 mg  12.5 mg Rectal Q4H PRN Berle Mull, MD      . risperiDONE microspheres (RISPERDAL CONSTA) injection 25 mg  25 mg Intramuscular Q14 Days Raylene Miyamoto, MD   25 mg at 12/14/14 1333  . sodium chloride 0.9 % injection 3 mL  3 mL Intravenous Q12H Berle Mull, MD   3 mL at 12/15/14 2200    Physical Exam: General appearance: alert and blind and deaf, resting comfotably Neck: no carotid bruit and no JVD Lungs: clear to auscultation bilaterally Heart: regular rate and rhythm Abdomen: soft, non-tender; bowel sounds normal; no masses,  no organomegaly and  obese Extremities: extremities normal, atraumatic, no cyanosis or edema and stage 1 decubiti on the heels bilaterally Pulses: 2+ and symmetric Skin: Skin color, texture, turgor normal. No rashes or lesions Neurologic: Mental status: Difficult to assess mental status, but eyes open, not responding normally to commands  Lab Results: Results for orders placed or performed during the hospital encounter of 12/11/14 (from the past 48 hour(s))  Glucose, capillary     Status: Abnormal   Collection Time: 12/14/14  1:19 PM  Result Value Ref Range   Glucose-Capillary 106 (H) 70 - 99 mg/dL   Comment 1 Capillary Sample   Glucose, capillary     Status: Abnormal   Collection Time: 12/14/14  5:20 PM  Result Value Ref Range   Glucose-Capillary 109 (H) 70 - 99 mg/dL   Comment 1 Capillary Sample   Basic metabolic panel     Status: Abnormal   Collection Time: 12/14/14  8:09 PM  Result Value Ref Range   Sodium 131 (L) 137 - 147 mEq/L   Potassium 4.6 3.7 - 5.3 mEq/L   Chloride 91 (L) 96 - 112 mEq/L   CO2 26 19 - 32 mEq/L   Glucose, Bld 200 (H) 70 - 99 mg/dL   BUN 24 (H) 6 - 23 mg/dL    Comment: DELTA CHECK NOTED   Creatinine, Ser 3.13 (H) 0.50 - 1.10 mg/dL    Comment: DELTA CHECK NOTED   Calcium 8.9 8.4 - 10.5 mg/dL   GFR calc non Af Amer 15 (L) >90 mL/min   GFR calc Af Amer 18 (L) >90 mL/min    Comment: (NOTE) The eGFR has been calculated using the CKD EPI equation. This calculation has not been validated in all clinical situations. eGFR's persistently <90 mL/min signify possible Chronic Kidney Disease.    Anion gap 14 5 - 15  Glucose, capillary     Status: None   Collection Time: 12/14/14 11:54 PM  Result Value Ref Range   Glucose-Capillary 99 70 - 99 mg/dL  Glucose, capillary     Status: Abnormal   Collection Time: 12/15/14  5:06 AM  Result Value Ref Range   Glucose-Capillary 116 (H) 70 - 99 mg/dL   Comment 1 Capillary Sample   Basic metabolic panel     Status: Abnormal   Collection  Time: 12/15/14  7:00 AM  Result Value Ref Range   Sodium 129 (L) 135 - 145 mmol/L    Comment: Please note change in reference range.  Potassium 4.5 3.5 - 5.1 mmol/L    Comment: Please note change in reference range.   Chloride 91 (L) 96 - 112 mEq/L   CO2 24 19 - 32 mmol/L   Glucose, Bld 118 (H) 70 - 99 mg/dL   BUN 32 (H) 6 - 23 mg/dL   Creatinine, Ser 4.19 (H) 0.50 - 1.10 mg/dL   Calcium 8.6 8.4 - 10.5 mg/dL   GFR calc non Af Amer 11 (L) >90 mL/min   GFR calc Af Amer 12 (L) >90 mL/min    Comment: (NOTE) The eGFR has been calculated using the CKD EPI equation. This calculation has not been validated in all clinical situations. eGFR's persistently <90 mL/min signify possible Chronic Kidney Disease.    Anion gap 14 5 - 15  CBC     Status: Abnormal   Collection Time: 12/15/14  8:00 AM  Result Value Ref Range   WBC 7.2 4.0 - 10.5 K/uL   RBC 2.87 (L) 3.87 - 5.11 MIL/uL   Hemoglobin 9.1 (L) 12.0 - 15.0 g/dL   HCT 28.1 (L) 36.0 - 46.0 %   MCV 97.9 78.0 - 100.0 fL   MCH 31.7 26.0 - 34.0 pg   MCHC 32.4 30.0 - 36.0 g/dL   RDW 14.3 11.5 - 15.5 %   Platelets 274 150 - 400 K/uL  Glucose, capillary     Status: Abnormal   Collection Time: 12/15/14  1:35 PM  Result Value Ref Range   Glucose-Capillary 180 (H) 70 - 99 mg/dL   Comment 1 Capillary Sample   Glucose, capillary     Status: Abnormal   Collection Time: 12/15/14  4:57 PM  Result Value Ref Range   Glucose-Capillary 157 (H) 70 - 99 mg/dL   Comment 1 Capillary Sample   Magnesium     Status: None   Collection Time: 12/15/14  5:15 PM  Result Value Ref Range   Magnesium 2.1 1.5 - 2.5 mg/dL  Glucose, capillary     Status: Abnormal   Collection Time: 12/16/14 12:08 AM  Result Value Ref Range   Glucose-Capillary 149 (H) 70 - 99 mg/dL   Comment 1 Capillary Sample   Basic metabolic panel     Status: Abnormal   Collection Time: 12/16/14  3:31 AM  Result Value Ref Range   Sodium 132 (L) 135 - 145 mmol/L    Comment: Please note  change in reference range.   Potassium 4.2 3.5 - 5.1 mmol/L    Comment: Please note change in reference range.   Chloride 94 (L) 96 - 112 mEq/L   CO2 28 19 - 32 mmol/L   Glucose, Bld 137 (H) 70 - 99 mg/dL   BUN 24 (H) 6 - 23 mg/dL   Creatinine, Ser 3.74 (H) 0.50 - 1.10 mg/dL   Calcium 8.6 8.4 - 10.5 mg/dL   GFR calc non Af Amer 12 (L) >90 mL/min   GFR calc Af Amer 14 (L) >90 mL/min    Comment: (NOTE) The eGFR has been calculated using the CKD EPI equation. This calculation has not been validated in all clinical situations. eGFR's persistently <90 mL/min signify possible Chronic Kidney Disease.    Anion gap 10 5 - 15  Glucose, capillary     Status: Abnormal   Collection Time: 12/16/14  6:05 AM  Result Value Ref Range   Glucose-Capillary 134 (H) 70 - 99 mg/dL   Comment 1 Capillary Sample   Glucose, capillary     Status: Abnormal  Collection Time: 12/16/14  7:22 AM  Result Value Ref Range   Glucose-Capillary 156 (H) 70 - 99 mg/dL   Comment 1 Capillary Sample     Imaging: Dg Chest Port 1 View  12/15/2014   CLINICAL DATA:  Acute onset of respiratory failure. Initial encounter.  EXAM: PORTABLE CHEST - 1 VIEW  COMPARISON:  Chest radiograph performed 12/13/2014  FINDINGS: The lungs are hypoexpanded. Mild vascular crowding and vascular congestion are seen, with question of minimal interstitial edema. There is no evidence of pleural effusion or pneumothorax.  The cardiomediastinal silhouette is borderline enlarged. No acute osseous abnormalities are seen.  IMPRESSION: Lungs hypoexpanded. Vascular congestion and borderline cardiomegaly, with question of minimal interstitial edema.   Electronically Signed   By: Garald Balding M.D.   On: 12/15/2014 00:08    Assessment:  Active Problems:   Leiomyoma of uterus   HLD (hyperlipidemia)   Deaf mutism, congenital   Essential hypertension   Acute respiratory failure with hypoxia   Diabetes mellitus with ESRD (end-stage renal disease)    Cardiomyopathy, ischemic-EF 30-35%   Cardiac arrest   Sustained VT (ventricular tachycardia)   Pulmonary edema   CKD (chronic kidney disease) stage V requiring chronic dialysis   Anemia in CKD (chronic kidney disease)   End stage renal disease on dialysis   Flash pulmonary edema   Pulmonary hypertension   Moderate tricuspid regurgitation   Diabetes type 2, controlled   Fibroid, uterine   Anemia of chronic disease   Abnormal urinalysis   Plan:  Switch IV amiodarone to amiodarone 400 mg po daily today. She is on Toprol XL 50 mg daily. Continue ASA, Plavix and low dose lisinopril. Palliative care meeting when interpreter can be arranged.  Time Spent Directly with Patient:  15 minutes  Length of Stay:  LOS: 5 days   Pixie Casino, MD, [facility] Attending Cardiologist [facility]  Julieta Rogalski C 12/16/2014, 10:08 AM

## 2014-12-16 NOTE — Progress Notes (Signed)
NUTRITION FOLLOW UP  Intervention:   -30 ml Prostat TID -RD to follow for goals of care  Nutrition Dx:   Increased nutrient needs related to chronic illness, ESRD as evidenced by estimated nutrition needs.   Goal:   Pt to meet >/= 90% of their estimated nutrition needs   Monitor:   PO intake, weight trends, labs, I/O's  Assessment:   Pt with Past medical history of ESRD on hemodialysis, dyslipidemia, hypertension, recent admission for anterior wall MI medically managed, diabetes mellitus, deaf and blind since childhood. The patient was brought in from the nursing home. The husband apparently her sound and found the patient unconscious.  Pt has been transitioned off Bi-Pap. She has been advanced to a renal diet with 1200 ml fluid restriction. Meal completion is currently good; 75-100%.  Noted a 2.7% wt loss x 1 week; fluid removal from HD likely contributory.  Will add protein modular supplement due to increased nutrition needs for wounds and due to fluid restriction.  Family and palliative care meeting pending to address goals of care.  Labs reviewed. Na: 132, Cl: 94, BUN/Creat: 24/3.74, Glucose: 137. CBGS: 134-156. Mg, K, and Phos WDL.   Height: Ht Readings from Last 1 Encounters:  12/12/14 5\' 6"  (1.676 m)    Weight Status:   Wt Readings from Last 1 Encounters:  12/16/14 180 lb 8.9 oz (81.9 kg)   12/12/14 185 lb 3 oz (84 kg)   Re-estimated needs:  Kcal: 1900-2100 Protein: 96-106 grams Fluid: 1.9-2.1 L  Skin: stage I pressure ulcer to bilateral heels, stage II pressure ulcer to sacrum  Diet Order: Diet renal W/1286mL fluid restriction   Intake/Output Summary (Last 24 hours) at 12/16/14 1316 Last data filed at 12/16/14 1100  Gross per 24 hour  Intake 2194.6 ml  Output      0 ml  Net 2194.6 ml    Last BM: 12/15/14   Labs:   Recent Labs Lab 12/11/14 2333  12/14/14 0825 12/14/14 2009 12/15/14 0700 12/15/14 1715 12/16/14 0331  NA 131*  < > 132* 131*  129*  --  132*  K 3.7  < > 5.3 4.6 4.5  --  4.2  CL 90*  < > 91* 91* 91*  --  94*  CO2 28  < > 24 26 24   --  28  BUN 21  < > 41* 24* 32*  --  24*  CREATININE 2.90*  < > 5.04* 3.13* 4.19*  --  3.74*  CALCIUM 8.8  < > 9.0 8.9 8.6  --  8.6  MG 2.1  --   --   --   --  2.1  --   PHOS  --   --  4.6  --   --   --   --   GLUCOSE 275*  < > 110* 200* 118*  --  137*  < > = values in this interval not displayed.  CBG (last 3)   Recent Labs  12/16/14 0605 12/16/14 0722 12/16/14 1200  GLUCAP 134* 156* 153*    Scheduled Meds: . amiodarone  400 mg Oral Daily  . antiseptic oral rinse  7 mL Mouth Rinse q12n4p  . aspirin  81 mg Oral Daily  . atorvastatin  10 mg Oral Daily  . calcium acetate  667 mg Oral TID WC  . clopidogrel  75 mg Oral Daily  . darbepoetin (ARANESP) injection - DIALYSIS  60 mcg Intravenous Q Mon-HD  . ferric gluconate (FERRLECIT/NULECIT) IV  125  mg Intravenous Q Mon-HD  . heparin  5,000 Units Subcutaneous 3 times per day  . insulin aspart  0-15 Units Subcutaneous Q6H  . lisinopril  2.5 mg Oral Daily  . metoprolol succinate  50 mg Oral Daily  . risperiDONE microspheres  25 mg Intramuscular Q14 Days  . sodium chloride  3 mL Intravenous Q12H    Continuous Infusions: . sodium chloride 10 mL/hr at 12/12/14 0445    Omara Alcon A. Jimmye Norman, RD, LDN Pager: [phone] After hours Pager: [phone]

## 2014-12-16 NOTE — Progress Notes (Signed)
[place] / ICU Progress Note  [name] [id number] DOB: 1956-06-23 DOA: 12/11/2014 PCP: No primary care provider on file.  Brief narrative: 58 year old female patient w/ known chronic kidney disease on dialysis, hypertension, recent admission for anterior wall MI medically managed, anemia which was fecal occult blood negative 2, and deaf and blind since childhood who was sent over from the nursing home after being found unconscious after her dialysis treatment on Friday. Nursing home personnel examined the patient and found her to be pulseless and CPR was initiated.  An AED was applied and found a non-shockable rhythm. Patient had return of spontaneous circulation/pulse after ~ 6 minutes of chest compression and administration of intraosseous lidocaine. Before these events no apparent acute event or changes were reported by the nursing home staff.  Upon arrival to the emergency department she was alert and responsive. He was evaluated in the emergency department by cardiology. After arrival she developed ventricular tachycardia as well as acute dyspnea and hypertensive urgency. She was placed on BiPAP, given IV Lasix, IV nitroglycerin and IV amiodarone. It was suspected patient had a ventricular tach arrest prior to arrival. She stabilized and was able to be admitted to stepdown unit.  Shortly after admission to the nursing unit patient once again decompensated from a respiratory standpoint so critical care medicine was consulted and the patient was transferred to the ICU. Patient's respiratory symptoms responded to dialysis. Cardiology has documented that she is not a candidate for an AICD and recommended discussion with the family about DO NOT RESUSCITATE status since she is likely to have recurrent V. tach in the future. Cardiology has documented that up titration of cardiac meds has been difficult due to ongoing hypotension/soft blood pressures. Dialysis has been  accomplished without significant hypotension.  HPI/Subjective: Patient eating lunch w/ assitance from Nurse Tech.  Does not appear to be in acute distress or discomfort.    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% -Suspect related to underlying cardiopathy with low EF -Not a candidate for AICD; need to have further discussions with patient's family - palliative evaluation ongoing -Currently on IV amiodarone per Cardiology - to be transitioned to oral  -Continue Plavix    CKD/ESRD stage V requiring chronic dialysis -Appreciate nephrology assistance -Tolerating dialysis     Essential hypertension -Blood pressure stable at present     Acute respiratory failure with hypoxia due to flash pulmonary edema post arrest -Primarily treated with dialysis -Currently stable on room air   Pulmonary hypertension with moderate tricuspid regurgitation -Follow    Diabetes mellitus  -CBGs reasonably well-controlled -Continue sliding scale insulin      Leiomyoma of uterus -Has anemia but likely not related to fibroids    Hyperlipidemia -Continue statin    Deaf mutism, congenital -Communicates through sign language in palm of hand to family only (not standard sign language)    Anemia in CKD  -Continue iron replacement with dialysis -Continue Aranesp   Abnormal urinalysis -Appeared consistent with UTI but culture negative so discontinued Rocephin after 3 days of tx   DVT prophylaxis: Subcutaneous heparin Code Status: Full Family Communication: No family at bedside Disposition Plan/Expected LOS: Stepdown  Consultants: Nephrology Cardiology PCCM  Procedures: 2-D echocardiogram: - Left ventricle: The cavity size was normal. Wall thickness was increased in a pattern of mild LVH. Systolic function was moderately to severely reduced. The estimated ejection fraction was in the range of 30% to 35%. There is severe hypokinesis  of the  mid-apicalanteroseptal and apical myocardium. Doppler parameters are consistent with high ventricular filling pressure. - Mitral valve: Calcified annulus. There was mild regurgitation. - Left atrium: The atrium was moderately to severely dilated. - Tricuspid valve: There was moderate regurgitation. - Pulmonary arteries: PA peak pressure: 51 mm Hg (S).  Antibiotics: Rocephin 12/18 > 12/20  Objective: Blood pressure 114/49, pulse 69, temperature 98.3 F (36.8 C), temperature source Oral, resp. rate 14, height 5\' 6"  (1.676 m), weight 81.9 kg (180 lb 8.9 oz), SpO2 99 %.  Intake/Output Summary (Last 24 hours) at 12/16/14 1406 Last data filed at 12/16/14 1100  Gross per 24 hour  Intake 2194.6 ml  Output      0 ml  Net 2194.6 ml   Exam: Gen: No acute respiratory distress Chest: Clear to auscultation bilaterally without wheezes or crackles Cardiac: Regular rate and rhythm, no rubs murmurs or gallops Abdomen: Soft nontender nondistended without obvious hepatosplenomegaly, no ascites Extremities: no signif c/c/e B LE   Scheduled Meds:  Scheduled Meds: . amiodarone  400 mg Oral Daily  . antiseptic oral rinse  7 mL Mouth Rinse q12n4p  . aspirin  81 mg Oral Daily  . atorvastatin  10 mg Oral Daily  . calcium acetate  667 mg Oral TID WC  . clopidogrel  75 mg Oral Daily  . darbepoetin (ARANESP) injection - DIALYSIS  60 mcg Intravenous Q Mon-HD  . feeding supplement (PRO-STAT SUGAR FREE 64)  30 mL Oral TID WC  . ferric gluconate (FERRLECIT/NULECIT) IV  125 mg Intravenous Q Mon-HD  . heparin  5,000 Units Subcutaneous 3 times per day  . insulin aspart  0-15 Units Subcutaneous Q6H  . lisinopril  2.5 mg Oral Daily  . metoprolol succinate  50 mg Oral Daily  . risperiDONE microspheres  25 mg Intramuscular Q14 Days  . sodium chloride  3 mL Intravenous Q12H   Data Reviewed: Basic Metabolic Panel:  Recent Labs Lab 12/11/14 2333  12/12/14 0330 12/12/14 0348 12/14/14 0825 12/14/14 2009  12/15/14 0700 12/15/14 1715 12/16/14 0331  NA 131*  < > 135* 134* 132* 131* 129*  --  132*  K 3.7  < > 4.4 4.1 5.3 4.6 4.5  --  4.2  CL 90*  < > 92* 95* 91* 91* 91*  --  94*  CO2 28  --  28  --  24 26 24   --  28  GLUCOSE 275*  < > 271* 306* 110* 200* 118*  --  137*  BUN 21  < > 23 24* 41* 24* 32*  --  24*  CREATININE 2.90*  < > 2.91* 3.20* 5.04* 3.13* 4.19*  --  3.74*  CALCIUM 8.8  --  9.2  --  9.0 8.9 8.6  --  8.6  MG 2.1  --   --   --   --   --   --  2.1  --   PHOS  --   --   --   --  4.6  --   --   --   --   < > = values in this interval not displayed.   Liver Function Tests:  Recent Labs Lab 12/11/14 2333 12/12/14 0330 12/14/14 0825  AST 79* 65*  --   ALT 68* 67*  --   ALKPHOS 82 93  --   BILITOT 0.3 0.3  --   PROT 7.5 8.4*  --   ALBUMIN 3.1* 3.6 3.2*    Recent Labs Lab  12/11/14 2333  LIPASE 50   CBC:  Recent Labs Lab 12/11/14 2333 12/11/14 2350 12/12/14 0330 12/12/14 0348 12/14/14 0824 12/15/14 0800  WBC 7.7  --  12.4*  --  8.9 7.2  NEUTROABS 5.4  --  10.0*  --   --   --   HGB 9.6* 12.2 10.8* 12.2 8.0* 9.1*  HCT 30.0* 36.0 33.1* 36.0 25.0* 28.1*  MCV 98.0  --  99.7  --  97.7 97.9  PLT 335  --  387  --  296 274   Cardiac Enzymes:  Recent Labs Lab 12/12/14 0330 12/12/14 0910 12/12/14 1558 12/12/14 2317  CKTOTAL 108  --   --   --   CKMB 3.5  --   --   --   TROPONINI <0.30 <0.30 0.48* 0.37*   BNP (last 3 results)  Recent Labs  11/19/14 2335 12/12/14 0314  PROBNP 19448.0* 24864.0*   CBG:  Recent Labs Lab 12/15/14 1657 12/16/14 0008 12/16/14 0605 12/16/14 0722 12/16/14 1200  GLUCAP 157* 149* 134* 156* 153*    Recent Results (from the past 240 hour(s))  Culture, blood (routine x 2)     Status: None (Preliminary result)   Collection Time: 12/11/14 11:38 PM  Result Value Ref Range Status   Specimen Description BLOOD RIGHT ARM  Final   Special Requests BOTTLES DRAWN AEROBIC AND ANAEROBIC 5CC EACH  Final   Culture  Setup Time    Final    12/12/2014 02:51 Performed at [facility]    Culture   Final           BLOOD CULTURE RECEIVED NO GROWTH TO DATE CULTURE WILL BE HELD FOR 5 DAYS BEFORE ISSUING A FINAL NEGATIVE REPORT Performed at [facility]    Report Status PENDING  Incomplete  Culture, blood (routine x 2)     Status: None (Preliminary result)   Collection Time: 12/11/14 11:40 PM  Result Value Ref Range Status   Specimen Description BLOOD RIGHT HAND  Final   Special Requests BOTTLES DRAWN AEROBIC AND ANAEROBIC 5CC EACH  Final   Culture  Setup Time   Final    12/12/2014 02:52 Performed at [facility]    Culture   Final           BLOOD CULTURE RECEIVED NO GROWTH TO DATE CULTURE WILL BE HELD FOR 5 DAYS BEFORE ISSUING A FINAL NEGATIVE REPORT Performed at [facility]    Report Status PENDING  Incomplete  Urine culture     Status: None   Collection Time: 12/12/14 12:49 AM  Result Value Ref Range Status   Specimen Description URINE, RANDOM  Final   Special Requests Normal  Final   Culture  Setup Time   Final    12/12/2014 06:28 Performed at [place] Performed at [facility]   Final   Culture NO GROWTH Performed at [facility]   Final   Report Status 12/13/2014 FINAL  Final     Studies:  Recent x-ray studies have been reviewed in detail by the Attending Physician  Time spent : 25 mins  Cherene Altes, MD [facility] For Consults/Admissions - Flow Manager - [phone] Office  [phone] Pager [phone]  On-Call/Text Page:      [email or web address]      password [facility]  12/16/2014, 2:06 PM   LOS: 5 days

## 2014-12-16 NOTE — Consult Note (Signed)
Patient ID:[name]      DOB: 10/25/1956      MRN:[id number]     Consult Note from the Palliative Medicine Team at [facility]    Consult Requested by: Dr. McClung   PCP: No primary care provider on file. Reason for Consultation: GOC   Phone Number:None  Assessment of patients Current state: I met today with [name]'s son and POA, Maurice, and with her sister, Deborah, over the phone. Both are very involved with [name]'s care. I spent much time explaining the gravity of her condition and the limited options we have for intervention and the concern that this will likely happen again - with worse outcomes. We discussed code status and the importance of considering quality of life. They explain that quality of life is very poor and that she has no interaction or communication at [facility]. They are very frustrated with what they describe is a lack of compassion and caring. Maurice is very tearful throughout the conversation as he tells me that he father is near end of life with hospice care and he didn't think his mother would be "this bad off too." I do think he understands and I encourage him to consider what we are asking [name] to go through and for what quality of life. He tells me that he wishes her to continue aggressive care and anything we can offer her at this point and wishes full code. He does tell me that he understands that his parents do not have [place] but is overwhelmed with the responsibility of dealing with both their health issues. He says his sister is no help and it is all on him - he greatly appreciates the support of his aunt Deborah. I encourage him to communicate with his mother for her wishes. Unfortunately they have developed their own form of sign language that she can feel his hands and understand which limits our ability to truly communicate with her other than through him. I will continue to follow and explore any options to help improve her quality of life as [place]  as she may have.    Goals of Care: 1.  Code Status: FULL   2. Disposition: Return to SNF with palliative.    3. Symptom Management:   1. Deaf Mute and blind: Communication is extremely difficult. Quality of life is very poor d/t lack of communication and interaction in her day to day life. She has only been blind ~1 year per family so this is an adjustment for them all.   4. Psychosocial: Emotional support provided to patient and family.    Brief HPI: 58 yo female admitted after being found unresponsive by husband at nursing home after dialysis. CPR was initiated and lidocaine given with ROSC and transfer to ED. She lives in nursing home with her ex husband and has limited communication d/t being deaf and now blind for the past ~1 yr. PMH reviewed below.    ROS: Unable to assess - difficult communicating with family.     PMH:  Past Medical History  Diagnosis Date  . Hyperlipidemia   . Hypertension     a. Has previously refused blood pressure meds.   . Deaf     Divorced from husband but lives with him. Has daughter but she does not care for her.  . Bilateral leg edema     a. Chronic.  . Psychiatric disorder     She frequently exhibits paranoia and has been diagnosed   with psychotic d/o NOS during hospital stay in the past. She is tangetial and perseverative during her visits. She apparently has had a bad experience with mental health in [place] in the past and refuses to discuss mental issues for fear that she will be sent back there. Her paranoia, communication issues, financial woes and lack of fam  . Fibroid uterus   . Anemia     Due to fibroids.  . Heart murmur   . Diabetes mellitus     a. Per PCP note 2012 (A1C 9.2) - pt unwilling to take meds and was educated on risk of uncontrolled DM.  b. A1C 5.9 in 11/2013.  . Renal disorder   . Coronary artery disease   . MI (myocardial infarction)   . Renal insufficiency      PSH: Past Surgical History  Procedure Laterality  Date  . No past surgeries    . Insertion of dialysis catheter Right 01/02/2014    Procedure: INSERTION OF DIALYSIS CATHETER;  Surgeon: Todd F Early, MD;  Location: [facility] OR;  Service: Vascular;  Laterality: Right;  . Av fistula placement Left 01/02/2014    Procedure: ARTERIOVENOUS (AV) FISTULA CREATION;  Surgeon: Todd F Early, MD;  Location: [facility] OR;  Service: Vascular;  Laterality: Left;   I have reviewed the FH and SH and  If appropriate update it with new information. No Known Allergies Scheduled Meds: . amiodarone  400 mg Oral Daily  . antiseptic oral rinse  7 mL Mouth Rinse q12n4p  . aspirin  81 mg Oral Daily  . atorvastatin  10 mg Oral Daily  . calcium acetate  667 mg Oral TID WC  . clopidogrel  75 mg Oral Daily  . darbepoetin (ARANESP) injection - DIALYSIS  60 mcg Intravenous Q Mon-HD  . feeding supplement (PRO-STAT SUGAR FREE 64)  30 mL Oral TID WC  . ferric gluconate (FERRLECIT/NULECIT) IV  125 mg Intravenous Q Mon-HD  . heparin  5,000 Units Subcutaneous 3 times per day  . insulin aspart  0-15 Units Subcutaneous Q6H  . lisinopril  2.5 mg Oral Daily  . metoprolol succinate  50 mg Oral Daily  . risperiDONE microspheres  25 mg Intramuscular Q14 Days  . sodium chloride  3 mL Intravenous Q12H   Continuous Infusions: . sodium chloride 10 mL/hr at 12/12/14 0445   PRN Meds:.acetaminophen **OR** acetaminophen, albuterol, bisacodyl, levalbuterol, nitroGLYCERIN, ondansetron **OR** ondansetron (ZOFRAN) IV, promethazine    BP 110/42 mmHg  Pulse 76  Temp(Src) 98.7 F (37.1 C) (Oral)  Resp 16  Ht 5' 6" (1.676 m)  Wt 81.9 kg (180 lb 8.9 oz)  BMI 29.16 kg/m2  SpO2 100%   PPS: 30%   Intake/Output Summary (Last 24 hours) at 12/16/14 1714 Last data filed at 12/16/14 1100  Gross per 24 hour  Intake 1954.6 ml  Output      0 ml  Net 1954.6 ml    Physical Exam:  General: NAD, well nourished HEENT: [place]/AT, no JVD, moist mucous membranes Chest: No labored breathing, symmetric CVS:  RRR Abdomen: Soft, NT, ND Ext: MAE, no edema Neuro: Alert, orientation not able to assess d/t difficulty with communication  Labs: CBC    Component Value Date/Time   WBC 7.2 12/15/2014 0800   WBC 7.2 09/02/2014   RBC 2.87* 12/15/2014 0800   HGB 9.1* 12/15/2014 0800   HCT 28.1* 12/15/2014 0800   PLT 274 12/15/2014 0800   MCV 97.9 12/15/2014 0800   MCH 31.7 12/15/2014 0800     MCHC 32.4 12/15/2014 0800   RDW 14.3 12/15/2014 0800   LYMPHSABS 1.3 12/12/2014 0330   MONOABS 1.0 12/12/2014 0330   EOSABS 0.0 12/12/2014 0330   BASOSABS 0.0 12/12/2014 0330    BMET    Component Value Date/Time   NA 132* 12/16/2014 0331   K 4.2 12/16/2014 0331   CL 94* 12/16/2014 0331   CO2 28 12/16/2014 0331   GLUCOSE 137* 12/16/2014 0331   BUN 24* 12/16/2014 0331   BUN 64* 09/02/2014   CREATININE 3.74* 12/16/2014 0331   CREATININE 5.4* 09/02/2014   CREATININE 0.76 05/23/2011 0956   CALCIUM 8.6 12/16/2014 0331   GFRNONAA 12* 12/16/2014 0331   GFRAA 14* 12/16/2014 0331    CMP     Component Value Date/Time   NA 132* 12/16/2014 0331   K 4.2 12/16/2014 0331   CL 94* 12/16/2014 0331   CO2 28 12/16/2014 0331   GLUCOSE 137* 12/16/2014 0331   BUN 24* 12/16/2014 0331   BUN 64* 09/02/2014   CREATININE 3.74* 12/16/2014 0331   CREATININE 5.4* 09/02/2014   CREATININE 0.76 05/23/2011 0956   CALCIUM 8.6 12/16/2014 0331   PROT 8.4* 12/12/2014 0330   ALBUMIN 3.2* 12/14/2014 0825   AST 65* 12/12/2014 0330   ALT 67* 12/12/2014 0330   ALKPHOS 93 12/12/2014 0330   BILITOT 0.3 12/12/2014 0330   GFRNONAA 12* 12/16/2014 0331   GFRAA 14* 12/16/2014 0331     Time In Time Out Total Time Spent with Patient Total Overall Time  1450 1710 20mn 833m    Greater than 50%  of this time was spent counseling and coordinating care related to the above assessment and plan.  AlVinie SillNP Palliative Medicine Team Pager # [phone]-F 8a-5p) Team Phone # [phone]/Weekends)

## 2014-12-16 NOTE — Progress Notes (Signed)
[facility] ROUNDING NOTE   Subjective:   Interval History:  Episodes of VT  Family meeting and palliative care meeting Objective:  Vital signs in last 24 hours:  Temp:  [97.4 F (36.3 C)-99.1 F (37.3 C)] 98.3 F (36.8 C) (12/23 1156) Pulse Rate:  [69-89] 69 (12/23 1156) Resp:  [14-20] 14 (12/23 1156) BP: (108-146)/(49-58) 114/49 mmHg (12/23 1156) SpO2:  [99 %-100 %] 99 % (12/23 1156) Weight:  [81.9 kg (180 lb 8.9 oz)] 81.9 kg (180 lb 8.9 oz) (12/23 0400)  Weight change: 0.9 kg (1 lb 15.8 oz) Filed Weights   12/15/14 0746 12/15/14 1230 12/16/14 0400  Weight: 82.7 kg (182 lb 5.1 oz) 79.4 kg (175 lb 0.7 oz) 81.9 kg (180 lb 8.9 oz)    Intake/Output: I/O last 3 completed shifts: In: 1927.8 [P.O.:1360; I.V.:567.8] Out: 3118 [Other:3118]   Intake/Output this shift:  Total I/O In: 786.8 [P.O.:720; I.V.:66.8] Out: 0   CVS- RRR RS- CTA ABD- BS present soft non-distended EXT- no edema   Basic Metabolic Panel:  Recent Labs Lab 12/11/14 2333  12/12/14 0330 12/12/14 0348 12/14/14 0825 12/14/14 2009 12/15/14 0700 12/15/14 1715 12/16/14 0331  NA 131*  < > 135* 134* 132* 131* 129*  --  132*  K 3.7  < > 4.4 4.1 5.3 4.6 4.5  --  4.2  CL 90*  < > 92* 95* 91* 91* 91*  --  94*  CO2 28  --  28  --  24 26 24   --  28  GLUCOSE 275*  < > 271* 306* 110* 200* 118*  --  137*  BUN 21  < > 23 24* 41* 24* 32*  --  24*  CREATININE 2.90*  < > 2.91* 3.20* 5.04* 3.13* 4.19*  --  3.74*  CALCIUM 8.8  --  9.2  --  9.0 8.9 8.6  --  8.6  MG 2.1  --   --   --   --   --   --  2.1  --   PHOS  --   --   --   --  4.6  --   --   --   --   < > = values in this interval not displayed.  Liver Function Tests:  Recent Labs Lab 12/11/14 2333 12/12/14 0330 12/14/14 0825  AST 79* 65*  --   ALT 68* 67*  --   ALKPHOS 82 93  --   BILITOT 0.3 0.3  --   PROT 7.5 8.4*  --   ALBUMIN 3.1* 3.6 3.2*    Recent Labs Lab 12/11/14 2333  LIPASE 50   No results for input(s): AMMONIA in  the last 168 hours.  CBC:  Recent Labs Lab 12/11/14 2333 12/11/14 2350 12/12/14 0330 12/12/14 0348 12/14/14 0824 12/15/14 0800  WBC 7.7  --  12.4*  --  8.9 7.2  NEUTROABS 5.4  --  10.0*  --   --   --   HGB 9.6* 12.2 10.8* 12.2 8.0* 9.1*  HCT 30.0* 36.0 33.1* 36.0 25.0* 28.1*  MCV 98.0  --  99.7  --  97.7 97.9  PLT 335  --  387  --  296 274    Cardiac Enzymes:  Recent Labs Lab 12/12/14 0330 12/12/14 0910 12/12/14 1558 12/12/14 2317  CKTOTAL 108  --   --   --   CKMB 3.5  --   --   --   TROPONINI <0.30 <0.30 0.48* 0.37*  BNP: Invalid input(s): POCBNP  CBG:  Recent Labs Lab 12/15/14 1657 12/16/14 0008 12/16/14 0605 12/16/14 0722 12/16/14 1200  GLUCAP 157* 149* 134* 156* 153*    Microbiology: Results for orders placed or performed during the hospital encounter of 12/11/14  Culture, blood (routine x 2)     Status: None (Preliminary result)   Collection Time: 12/11/14 11:38 PM  Result Value Ref Range Status   Specimen Description BLOOD RIGHT ARM  Final   Special Requests BOTTLES DRAWN AEROBIC AND ANAEROBIC 5CC EACH  Final   Culture  Setup Time   Final    12/12/2014 02:51 Performed at [facility]    Culture   Final           BLOOD CULTURE RECEIVED NO GROWTH TO DATE CULTURE WILL BE HELD FOR 5 DAYS BEFORE ISSUING A FINAL NEGATIVE REPORT Performed at [facility]    Report Status PENDING  Incomplete  Culture, blood (routine x 2)     Status: None (Preliminary result)   Collection Time: 12/11/14 11:40 PM  Result Value Ref Range Status   Specimen Description BLOOD RIGHT HAND  Final   Special Requests BOTTLES DRAWN AEROBIC AND ANAEROBIC 5CC EACH  Final   Culture  Setup Time   Final    12/12/2014 02:52 Performed at [facility]    Culture   Final           BLOOD CULTURE RECEIVED NO GROWTH TO DATE CULTURE WILL BE HELD FOR 5 DAYS BEFORE ISSUING A FINAL NEGATIVE REPORT Performed at [facility]    Report Status PENDING   Incomplete  Urine culture     Status: None   Collection Time: 12/12/14 12:49 AM  Result Value Ref Range Status   Specimen Description URINE, RANDOM  Final   Special Requests Normal  Final   Culture  Setup Time   Final    12/12/2014 06:28 Performed at [facility] Performed at [facility]   Final   Culture NO GROWTH Performed at [facility]   Final   Report Status 12/13/2014 FINAL  Final    Coagulation Studies: No results for input(s): LABPROT, INR in the last 72 hours.  Urinalysis: No results for input(s): COLORURINE, LABSPEC, PHURINE, GLUCOSEU, HGBUR, BILIRUBINUR, KETONESUR, PROTEINUR, UROBILINOGEN, NITRITE, LEUKOCYTESUR in the last 72 hours.  Invalid input(s): APPERANCEUR    Imaging: Dg Chest Port 1 View  12/15/2014   CLINICAL DATA:  Acute onset of respiratory failure. Initial encounter.  EXAM: PORTABLE CHEST - 1 VIEW  COMPARISON:  Chest radiograph performed 12/13/2014  FINDINGS: The lungs are hypoexpanded. Mild vascular crowding and vascular congestion are seen, with question of minimal interstitial edema. There is no evidence of pleural effusion or pneumothorax.  The cardiomediastinal silhouette is borderline enlarged. No acute osseous abnormalities are seen.  IMPRESSION: Lungs hypoexpanded. Vascular congestion and borderline cardiomegaly, with question of minimal interstitial edema.   Electronically Signed   By: Garald Balding M.D.   On: 12/15/2014 00:08     Medications:   . sodium chloride 10 mL/hr at 12/12/14 0445   . amiodarone  400 mg Oral Daily  . antiseptic oral rinse  7 mL Mouth Rinse q12n4p  . aspirin  81 mg Oral Daily  . atorvastatin  10 mg Oral Daily  . calcium acetate  667 mg Oral TID WC  . clopidogrel  75 mg Oral Daily  . darbepoetin (ARANESP) injection - DIALYSIS  60 mcg Intravenous Q Mon-HD  . ferric gluconate (FERRLECIT/NULECIT) IV  125 mg Intravenous Q Mon-HD  . heparin  5,000 Units Subcutaneous 3  times per day  . insulin aspart  0-15 Units Subcutaneous Q6H  . lisinopril  2.5 mg Oral Daily  . metoprolol succinate  50 mg Oral Daily  . risperiDONE microspheres  25 mg Intramuscular Q14 Days  . sodium chloride  3 mL Intravenous Q12H   acetaminophen **OR** acetaminophen, albuterol, bisacodyl, levalbuterol, nitroGLYCERIN, ondansetron **OR** ondansetron (ZOFRAN) IV, promethazine  Assessment/ Plan:  1. LOC episode / possible arrest - occurred at SNF, AED did not recognize VT/VF, got ~6 min CPR. Etiology may have been resp arrest d/t pulm edema and vol overload. She was here w similar thing last admission. 2. Hypotension - suspect this is all medication related BP improved 3. Vol excess / pulm edema - still overloaded, challenge EDW 4. ESRD MWF - K 5.3 - extra tmt for volume on Tuesday - serially decrease volume; had f'gram in early November at [facility] - no indication for intervention - watch for cannulation issues  5. CAD / hx MI / ischemic CM EF 35%; repeat Echo pending 6. DM2 - BS controlled low 100s 7. Anemia of CKD Hgb declining down to 8 - will change Aranesp to 60 and give today and check Fe stores tsat 36% last month; also check FOBT 8. Psych - hx of paranoid behavior 9. HTN - clon/ hydral / lisinopril / MTP on hold now--BP ok today 10. Increased LFT - may be liver congestion - repeat tomorrow with decreased volume  Plan dialysis in AM   LOS: 5 [place] Arp W @TODAY @1 :06 PM

## 2014-12-17 DIAGNOSIS — Z7382 Dual sensory impairment: Secondary | ICD-10-CM

## 2014-12-17 DIAGNOSIS — H540 Blindness, both eyes: Secondary | ICD-10-CM

## 2014-12-17 DIAGNOSIS — D631 Anemia in chronic kidney disease: Secondary | ICD-10-CM

## 2014-12-17 DIAGNOSIS — N189 Chronic kidney disease, unspecified: Secondary | ICD-10-CM

## 2014-12-17 DIAGNOSIS — H9190 Unspecified hearing loss, unspecified ear: Secondary | ICD-10-CM

## 2014-12-17 LAB — GLUCOSE, CAPILLARY
Glucose-Capillary: 112 mg/dL — ABNORMAL HIGH (ref 70–99)
Glucose-Capillary: 118 mg/dL — ABNORMAL HIGH (ref 70–99)
Glucose-Capillary: 103 mg/dL — ABNORMAL HIGH (ref 70–99)
Glucose-Capillary: 147 mg/dL — ABNORMAL HIGH (ref 70–99)

## 2014-12-17 MED ORDER — LIDOCAINE-PRILOCAINE 2.5-2.5 % EX CREA: 1.0000 "application " | TOPICAL_CREAM | CUTANEOUS | Status: DC | PRN

## 2014-12-17 MED ORDER — ALTEPLASE 2 MG IJ SOLR
2.0000 mg | Freq: Once | INTRAMUSCULAR | Status: DC | PRN
  Filled 2014-12-17: qty 2

## 2014-12-17 MED ORDER — LIDOCAINE HCL (PF) 1 % IJ SOLN: 5.0000 mL | INTRAMUSCULAR | Status: DC | PRN

## 2014-12-17 MED ORDER — SODIUM CHLORIDE 0.9 % IV SOLN: 100.0000 mL | INTRAVENOUS | Status: DC | PRN

## 2014-12-17 MED ORDER — HEPARIN SODIUM (PORCINE) 1000 UNIT/ML DIALYSIS: 1000.0000 [IU] | INTRAMUSCULAR | Status: DC | PRN

## 2014-12-17 MED ORDER — PENTAFLUOROPROP-TETRAFLUOROETH EX AERO: 1.0000 "application " | INHALATION_SPRAY | CUTANEOUS | Status: DC | PRN

## 2014-12-17 MED ORDER — NEPRO/CARBSTEADY PO LIQD
237.0000 mL | ORAL | Status: DC | PRN
  Filled 2014-12-17: qty 237

## 2014-12-17 NOTE — Progress Notes (Signed)
[facility]                                                                              Patient Demographics  [name], is a 58 y.o. female, DOB - 08/23/1956, VDF:[id number]  Admit date - 12/11/2014   Admitting Physician Berle Mull, MD  Outpatient Primary MD for the patient is No primary care provider on file.  LOS - 6   Chief Complaint  Patient presents with  . ams       Brief narrative: 58 year old female patient w/ known chronic kidney disease on dialysis, hypertension, recent admission for anterior wall MI medically managed, anemia which was fecal occult blood negative 2, and deaf and blind since childhood who was sent over from the nursing home after being found unconscious after her dialysis treatment on Friday. Nursing home personnel examined the patient and found her to be pulseless and CPR was initiated. An AED was applied and found a non-shockable rhythm. Patient had return of spontaneous circulation/pulse after ~ 6 minutes of chest compression and administration of intraosseous lidocaine. Before these events no apparent acute event or changes were reported by the nursing home staff.  Upon arrival to the emergency department she was alert and responsive. He was evaluated in the emergency department by cardiology. After arrival she developed ventricular tachycardia as well as acute dyspnea and hypertensive urgency. She was placed on BiPAP, given IV Lasix, IV nitroglycerin and IV amiodarone. It was suspected patient had a ventricular tach arrest prior to arrival. She stabilized and was able to be admitted to stepdown unit.  Shortly after admission to the nursing unit patient once again decompensated from a respiratory standpoint so critical care medicine was consulted and the patient was transferred to the ICU. Patient's respiratory symptoms responded to dialysis. Cardiology has documented that she is not a candidate for an AICD and recommended discussion with the family  about DO NOT RESUSCITATE status since she is likely to have recurrent V. tach in the future. Cardiology has documented that up titration of cardiac meds has been difficult due to ongoing hypotension/soft blood pressures. Dialysis has been accomplished without significant hypotension.  Palliative care met with patient's son. At this time patient's son is not ready to make patient DO NOT RESUSCITATE or comfort care. He understands that his mother is gravely ill however he is currently dealing with his father's illnesses as well.  Assessment & Plan   Cardiac arrest/sustained V. tach/ischemic cardiomyopathy -Likely secondary to underlying cream off a low EF -EF of 30-35% -Not a candidate for AICD -Cardiology consulted and appreciated -Continue amiodarone per cardiology, currently on oral form -Continue Plavix  ESRD requiring dialysis -Nephrology consult appreciated  Essential hypertension -Stable  Acute respiratory failure with hypoxia due to flash pulmonary edema -Status post arrest -Treated with dialysis -Appears to be resolved, patient stable on room air  Pulmonary hypertension with moderate regurgitation -Cardiology consulted and appreciated  Diabetes mellitus -Continue compliance with CBG monitoring  Uterine leiomyoma  Congenital mutism and deafness/blindness -Communicates through sign language using the palm of her hand with family  Anemia of chronic disease -Continue Aranesp with iron supplementation  Abnormal urinalysis consistent with UTI -Urine culture negative,  patient received 3 days of Rocephin  Code Status: Full  Family Communication: None at bedside  Disposition Plan: Admitted.  Patient appears to be stable, will transfer to medical floor  Time Spent in minutes   30 minutes  Procedures  Echocardiogram: EF 30-35%  Consults   Cardiology Nephrology Palliative care PCCM  DVT Prophylaxis  heparin  Lab Results  Component Value Date   PLT 274  12/15/2014    Medications  Scheduled Meds: . amiodarone  400 mg Oral Daily  . antiseptic oral rinse  7 mL Mouth Rinse q12n4p  . aspirin  81 mg Oral Daily  . atorvastatin  10 mg Oral Daily  . calcium acetate  667 mg Oral TID WC  . clopidogrel  75 mg Oral Daily  . darbepoetin (ARANESP) injection - DIALYSIS  60 mcg Intravenous Q Mon-HD  . feeding supplement (PRO-STAT SUGAR FREE 64)  30 mL Oral TID WC  . ferric gluconate (FERRLECIT/NULECIT) IV  125 mg Intravenous Q Mon-HD  . heparin  5,000 Units Subcutaneous 3 times per day  . insulin aspart  0-15 Units Subcutaneous Q6H  . lisinopril  2.5 mg Oral Daily  . metoprolol succinate  50 mg Oral Daily  . risperiDONE microspheres  25 mg Intramuscular Q14 Days  . sodium chloride  3 mL Intravenous Q12H   Continuous Infusions: . sodium chloride 10 mL/hr at 12/12/14 0445   PRN Meds:.acetaminophen **OR** acetaminophen, albuterol, bisacodyl, levalbuterol, nitroGLYCERIN, ondansetron **OR** ondansetron (ZOFRAN) IV, promethazine  Antibiotics    Anti-infectives    Start     Dose/Rate Route Frequency Ordered Stop   12/13/14 0300  cefTRIAXone (ROCEPHIN) 1 g in dextrose 5 % 50 mL IVPB - Premix  Status:  Discontinued     1 g100 mL/hr over 30 Minutes Intravenous Every 24 hours 12/12/14 0314 12/14/14 1458   12/12/14 0200  cefTRIAXone (ROCEPHIN) 1 g in dextrose 5 % 50 mL IVPB     1 g100 mL/hr over 30 Minutes Intravenous  Once 12/12/14 0150 12/12/14 0342      Subjective:   [name] seen and examined today.  Patient deaf, mute and blind.  Objective:   Filed Vitals:   12/17/14 1130 12/17/14 1145 12/17/14 1155 12/17/14 1236  BP: 124/61 137/68 124/63 122/51  Pulse: 73 74 74 77  Temp:   97.2 F (36.2 C) 98.6 F (37 C)  TempSrc:   Oral Oral  Resp:  15 18 18   Weight:   80.3 kg (177 lb 0.5 oz)   SpO2:   99% 98%    Wt Readings from Last 3 Encounters:  12/17/14 80.3 kg (177 lb 0.5 oz)  12/02/14 80.74 kg (178 lb)  12/01/14 80.74 kg (178 lb)      Intake/Output Summary (Last 24 hours) at 12/17/14 1643 Last data filed at 12/17/14 1155  Gross per 24 hour  Intake    340 ml  Output   3042 ml  Net  -2702 ml    Exam  General: Well developed, well nourished, NAD, appears stated age  29: NCAT, mucous membranes moist.   Cardiovascular: S1 S2 auscultated,  Regular rate and rhythm.  Respiratory: Clear to auscultation bilaterally with equal chest rise  Abdomen: Soft, nontender, nondistended, + bowel sounds  Extremities: warm dry without cyanosis clubbing or edema   Data Review   Micro Results Recent Results (from the past 240 hour(s))  Culture, blood (routine x 2)     Status: None (Preliminary result)   Collection Time: 12/11/14 11:38  PM  Result Value Ref Range Status   Specimen Description BLOOD RIGHT ARM  Final   Special Requests BOTTLES DRAWN AEROBIC AND ANAEROBIC 5CC EACH  Final   Culture  Setup Time   Final    12/12/2014 02:51 Performed at [facility]    Culture   Final           BLOOD CULTURE RECEIVED NO GROWTH TO DATE CULTURE WILL BE HELD FOR 5 DAYS BEFORE ISSUING A FINAL NEGATIVE REPORT Performed at [facility]    Report Status PENDING  Incomplete  Culture, blood (routine x 2)     Status: None (Preliminary result)   Collection Time: 12/11/14 11:40 PM  Result Value Ref Range Status   Specimen Description BLOOD RIGHT HAND  Final   Special Requests BOTTLES DRAWN AEROBIC AND ANAEROBIC 5CC EACH  Final   Culture  Setup Time   Final    12/12/2014 02:52 Performed at [facility]    Culture   Final           BLOOD CULTURE RECEIVED NO GROWTH TO DATE CULTURE WILL BE HELD FOR 5 DAYS BEFORE ISSUING A FINAL NEGATIVE REPORT Performed at [facility]    Report Status PENDING  Incomplete  Urine culture     Status: None   Collection Time: 12/12/14 12:49 AM  Result Value Ref Range Status   Specimen Description URINE, RANDOM  Final   Special Requests Normal  Final   Culture   Setup Time   Final    12/12/2014 06:28 Performed at [place] Performed at [facility]   Final   Culture NO GROWTH Performed at [facility]   Final   Report Status 12/13/2014 FINAL  Final    Radiology Reports Dg Chest 2 View  11/20/2014   CLINICAL DATA:  Respiratory distress and chest pain.  EXAM: CHEST  2 VIEW  COMPARISON:  01/02/2014.  FINDINGS: Mild cardiac enlargement. Small bilateral pleural effusions and mild to moderate interstitial edema is identified. No focal airspace consolidation identified. Spondylosis noted within the thoracic spine.  IMPRESSION: 1. Mild to moderate CHF.   Electronically Signed   By: Kerby Moors M.D.   On: 11/20/2014 00:34   Ct Head Wo Contrast  12/12/2014   CLINICAL DATA:  Acute onset of altered mental status. Status post last fall with asystole, status post CPR and resuscitation. Initial encounter. Concern for head or cervical spine injury.  EXAM: CT HEAD WITHOUT CONTRAST  CT CERVICAL SPINE WITHOUT CONTRAST  TECHNIQUE: Multidetector CT imaging of the head and cervical spine was performed following the standard protocol without intravenous contrast. Multiplanar CT image reconstructions of the cervical spine were also generated.  COMPARISON:  None.  FINDINGS: CT HEAD FINDINGS  There is no evidence of acute infarction, mass lesion, or intra- or extra-axial hemorrhage on CT.  Prominence of the ventricles and sulci reflects mild cortical volume loss. Scattered periventricular and subcortical white matter change likely reflects small vessel ischemic microangiopathy.  The brainstem and fourth ventricle are within normal limits. The basal ganglia are unremarkable in appearance. The cerebral hemispheres demonstrate grossly normal gray-white differentiation. No mass effect or midline shift is seen.  There is no evidence of fracture; visualized osseous structures are unremarkable in appearance. The visualized portions  of the orbits are within normal limits. The paranasal sinuses and mastoid air cells are well-aerated. No significant soft tissue abnormalities are seen.  CT  CERVICAL SPINE FINDINGS  There is no evidence of fracture or subluxation. Vertebral bodies demonstrate normal height and alignment. There is mild narrowing of the intervertebral disc spaces at C3-C4 and C4-C5, with associated anterior and posterior disc osteophyte complexes. Prevertebral soft tissues are within normal limits.  A 0.9 cm hypodensity is noted at the left thyroid lobe. There is mild interstitial prominence at the lung apices. No significant soft tissue abnormalities are seen.  IMPRESSION: 1. No evidence of traumatic intracranial injury or fracture. 2. No evidence of fracture or subluxation along the cervical spine. 3. Mild cortical volume loss and scattered small vessel ischemic microangiopathy. 4. Minimal degenerative change at the upper cervical spine. 5. Sub-centimeter thyroid nodule(s) noted, too small to characterize, but most likely benign in the absence of known clinical risk factors for thyroid carcinoma. 6. Mild interstitial prominence at the lung apices.   Electronically Signed   By: Garald Balding M.D.   On: 12/12/2014 00:02   Ct Angio Chest Pe W/cm &/or Wo Cm  12/12/2014   CLINICAL DATA:  Acute onset of chest pain and shortness of breath. Patient was unresponsive, with asystole, status post cardiac resuscitation. Initial encounter.  EXAM: CT ANGIOGRAPHY CHEST WITH CONTRAST  TECHNIQUE: Multidetector CT imaging of the chest was performed using the standard protocol during bolus administration of intravenous contrast. Multiplanar CT image reconstructions and MIPs were obtained to evaluate the vascular anatomy.  CONTRAST:  66m OMNIPAQUE IOHEXOL 350 MG/ML SOLN  COMPARISON:  Chest radiograph performed 12/11/2014  FINDINGS: There is no evidence of pulmonary embolus.  Mild peripheral nodular densities are thought to reflect atelectasis.  There is slight interstitial prominence, without definite pulmonary edema. There is no evidence of significant focal consolidation, pleural effusion or pneumothorax. No suspicious masses are identified; no abnormal focal contrast enhancement is seen.  There is mild diffuse peribronchial thickening, particularly at the hila. No definite mediastinal lymphadenopathy is seen. No pericardial effusion is identified. The great vessels are grossly unremarkable in appearance. No axillary lymphadenopathy is seen. Multiple hypodensities are seen within the thyroid gland, measuring up to 1.4 cm in size, with minimal calcification.  The visualized portions of the liver and spleen are unremarkable.  No acute osseous abnormalities are seen.  Review of the MIP images confirms the above findings.  IMPRESSION: 1. No evidence of pulmonary embolus. 2. Slight interstitial prominence noted, without definite pulmonary edema. Mild peripheral nodular densities are thought to reflect atelectasis. Lungs otherwise grossly clear. 3. Mild diffuse peribronchial thickening noted, nonspecific in appearance. 4. Multiple hypodensities within the thyroid gland measuring up to 1.4 cm in size. Consider further evaluation with thyroid ultrasound. If patient is clinically hyperthyroid, consider nuclear medicine thyroid uptake and scan.   Electronically Signed   By: JGarald BaldingM.D.   On: 12/12/2014 01:51   Ct Cervical Spine Wo Contrast  12/12/2014   CLINICAL DATA:  Acute onset of altered mental status. Status post last fall with asystole, status post CPR and resuscitation. Initial encounter. Concern for head or cervical spine injury.  EXAM: CT HEAD WITHOUT CONTRAST  CT CERVICAL SPINE WITHOUT CONTRAST  TECHNIQUE: Multidetector CT imaging of the head and cervical spine was performed following the standard protocol without intravenous contrast. Multiplanar CT image reconstructions of the cervical spine were also generated.  COMPARISON:  None.   FINDINGS: CT HEAD FINDINGS  There is no evidence of acute infarction, mass lesion, or intra- or extra-axial hemorrhage on CT.  Prominence of the ventricles and sulci reflects mild cortical volume  loss. Scattered periventricular and subcortical white matter change likely reflects small vessel ischemic microangiopathy.  The brainstem and fourth ventricle are within normal limits. The basal ganglia are unremarkable in appearance. The cerebral hemispheres demonstrate grossly normal gray-white differentiation. No mass effect or midline shift is seen.  There is no evidence of fracture; visualized osseous structures are unremarkable in appearance. The visualized portions of the orbits are within normal limits. The paranasal sinuses and mastoid air cells are well-aerated. No significant soft tissue abnormalities are seen.  CT CERVICAL SPINE FINDINGS  There is no evidence of fracture or subluxation. Vertebral bodies demonstrate normal height and alignment. There is mild narrowing of the intervertebral disc spaces at C3-C4 and C4-C5, with associated anterior and posterior disc osteophyte complexes. Prevertebral soft tissues are within normal limits.  A 0.9 cm hypodensity is noted at the left thyroid lobe. There is mild interstitial prominence at the lung apices. No significant soft tissue abnormalities are seen.  IMPRESSION: 1. No evidence of traumatic intracranial injury or fracture. 2. No evidence of fracture or subluxation along the cervical spine. 3. Mild cortical volume loss and scattered small vessel ischemic microangiopathy. 4. Minimal degenerative change at the upper cervical spine. 5. Sub-centimeter thyroid nodule(s) noted, too small to characterize, but most likely benign in the absence of known clinical risk factors for thyroid carcinoma. 6. Mild interstitial prominence at the lung apices.   Electronically Signed   By: Garald Balding M.D.   On: 12/12/2014 00:02   Nm Myocar Multi W/spect W/wall Motion /  Ef  11/24/2014   CLINICAL DATA:  58 year old with suspected anterior wall myocardial infarction. Medical history is significant for end-stage renal disease and diabetes.  EXAM: MYOCARDIAL IMAGING WITH SPECT (REST AND PHARMACOLOGIC-STRESS)  GATED LEFT VENTRICULAR WALL MOTION STUDY  LEFT VENTRICULAR EJECTION FRACTION  TECHNIQUE: Standard myocardial SPECT imaging was performed after resting intravenous injection of 10 mCi Tc-23msestamibi. Subsequently, intravenous infusion of Lexiscan was performed under the supervision of the Cardiology staff. At peak effect of the drug, 30 mCi Tc-93mestamibi was injected intravenously and standard myocardial SPECT imaging was performed. Quantitative gated imaging was also performed to evaluate left ventricular wall motion, and estimate left ventricular ejection fraction.  COMPARISON:  Chest radiograph 11/20/2014  FINDINGS: Perfusion: Fixed defect involving the apex. There is a fixed defect involving the anterior lateral wall at the apex and mid segment. Fixed defect along the inferior wall in the mid and basilar segments. No evidence for reversibility.  Wall Motion: Diffuse hypokinesia. Minimal wall motion in the lateral wall.  Left Ventricular Ejection Fraction: 35 %  End diastolic volume 20240l  End systolic volume 13973l  IMPRESSION: 1. Fixed defects involving the apex, anterior lateral wall and the inferior wall. The apex and anterior lateral wall fixed defects are suggestive for an infarct. No evidence for reversibility or ischemia.  2. Diffuse hypokinesia and minimal wall motion along the lateral wall.  3. Left ventricular ejection fraction is 35%.  4. High-risk stress test findings*.  *2012 Appropriate Use Criteria for Coronary Revascularization Focused Update: J Am Coll Cardiol. 205329;92(4):268-341http://[email or web address]?articleid=1201161   Electronically Signed   By: AdMarkus Daft.D.   On: 11/24/2014 17:20   Dg Chest Port 1 View  12/15/2014    CLINICAL DATA:  Acute onset of respiratory failure. Initial encounter.  EXAM: PORTABLE CHEST - 1 VIEW  COMPARISON:  Chest radiograph performed 12/13/2014  FINDINGS: The lungs are hypoexpanded. Mild vascular crowding and vascular congestion are seen, with question  of minimal interstitial edema. There is no evidence of pleural effusion or pneumothorax.  The cardiomediastinal silhouette is borderline enlarged. No acute osseous abnormalities are seen.  IMPRESSION: Lungs hypoexpanded. Vascular congestion and borderline cardiomegaly, with question of minimal interstitial edema.   Electronically Signed   By: Garald Balding M.D.   On: 12/15/2014 00:08   Dg Chest Port 1 View  12/13/2014   CLINICAL DATA:  Pulmonary edema  EXAM: PORTABLE CHEST - 1 VIEW  COMPARISON:  December 12, 2014  FINDINGS: There is less edema compared to 1 day prior. There is currently trace interstitial edema. There is subtle alveolar opacity in each lung base and right mid lung. Suspect edema, although small foci of pneumonia could present similarly. No new areas of opacity identified. Heart is mildly enlarged with mild pulmonary venous hypertension. No adenopathy. There are small cervical ribs bilaterally, larger on the left than on the right.  IMPRESSION: Less edema compared to 1 day prior. Scattered foci of alveolar opacity may represent either localized edema or pneumonia. Both entities may exist concurrently. No new opacity. No change in cardiac silhouette. The appearance of the pulmonary vasculature suggests a degree of volume overload.   Electronically Signed   By: Lowella Grip M.D.   On: 12/13/2014 07:03   Dg Chest Port 1 View  12/12/2014   CLINICAL DATA:  Acute onset of shortness of breath. Assess for flash pulmonary edema.  EXAM: PORTABLE CHEST - 1 VIEW  COMPARISON:  Chest radiograph from 12/11/2014  FINDINGS: The lungs are well-aerated. Mild right-sided airspace opacities could reflect mildly asymmetric interstitial edema. There  is trace fluid along the right minor fissure. No pneumothorax is identified.  The cardiomediastinal silhouette is borderline normal in size. An external pacing pad is noted overlying the left hemithorax. No acute osseous abnormalities are seen.  IMPRESSION: Mild right-sided airspace opacities could reflect mildly asymmetric interstitial edema. Trace fluid along the right minor fissure.   Electronically Signed   By: Garald Balding M.D.   On: 12/12/2014 04:55   Dg Chest Port 1 View  12/12/2014   CLINICAL DATA:  Status post cardiac arrest and resuscitation. Initial encounter.  EXAM: PORTABLE CHEST - 1 VIEW  COMPARISON:  Chest radiograph from 01/02/2014  FINDINGS: There is elevation of the right hemidiaphragm. There is no evidence of focal opacification, pleural effusion or pneumothorax.  The cardiomediastinal silhouette is borderline normal in size. No acute osseous abnormalities are seen. A small left cervical rib is incidentally noted.  IMPRESSION: 1. Elevation of the right hemidiaphragm; lungs remain grossly clear. 2. Small left cervical rib incidentally noted.   Electronically Signed   By: Garald Balding M.D.   On: 12/12/2014 00:04    CBC  Recent Labs Lab 12/11/14 2333 12/11/14 2350 12/12/14 0330 12/12/14 0348 12/14/14 0824 12/15/14 0800  WBC 7.7  --  12.4*  --  8.9 7.2  HGB 9.6* 12.2 10.8* 12.2 8.0* 9.1*  HCT 30.0* 36.0 33.1* 36.0 25.0* 28.1*  PLT 335  --  387  --  296 274  MCV 98.0  --  99.7  --  97.7 97.9  MCH 31.4  --  32.5  --  31.3 31.7  MCHC 32.0  --  32.6  --  32.0 32.4  RDW 14.3  --  14.4  --  14.7 14.3  LYMPHSABS 1.3  --  1.3  --   --   --   MONOABS 0.8  --  1.0  --   --   --  EOSABS 0.1  --  0.0  --   --   --   BASOSABS 0.1  --  0.0  --   --   --     Chemistries   Recent Labs Lab 12/11/14 2333  12/12/14 0330 12/12/14 0348 12/14/14 0825 12/14/14 2009 12/15/14 0700 12/15/14 1715 12/16/14 0331  NA 131*  < > 135* 134* 132* 131* 129*  --  132*  K 3.7  < > 4.4 4.1  5.3 4.6 4.5  --  4.2  CL 90*  < > 92* 95* 91* 91* 91*  --  94*  CO2 28  --  28  --  24 26 24   --  28  GLUCOSE 275*  < > 271* 306* 110* 200* 118*  --  137*  BUN 21  < > 23 24* 41* 24* 32*  --  24*  CREATININE 2.90*  < > 2.91* 3.20* 5.04* 3.13* 4.19*  --  3.74*  CALCIUM 8.8  --  9.2  --  9.0 8.9 8.6  --  8.6  MG 2.1  --   --   --   --   --   --  2.1  --   AST 79*  --  65*  --   --   --   --   --   --   ALT 68*  --  67*  --   --   --   --   --   --   ALKPHOS 82  --  93  --   --   --   --   --   --   BILITOT 0.3  --  0.3  --   --   --   --   --   --   < > = values in this interval not displayed. ------------------------------------------------------------------------------------------------------------------ estimated creatinine clearance is 17.5 mL/min (by C-G formula based on Cr of 3.74). ------------------------------------------------------------------------------------------------------------------ No results for input(s): HGBA1C in the last 72 hours. ------------------------------------------------------------------------------------------------------------------ No results for input(s): CHOL, HDL, LDLCALC, TRIG, CHOLHDL, LDLDIRECT in the last 72 hours. ------------------------------------------------------------------------------------------------------------------ No results for input(s): TSH, T4TOTAL, T3FREE, THYROIDAB in the last 72 hours.  Invalid input(s): FREET3 ------------------------------------------------------------------------------------------------------------------ No results for input(s): VITAMINB12, FOLATE, FERRITIN, TIBC, IRON, RETICCTPCT in the last 72 hours.  Coagulation profile  Recent Labs Lab 12/12/14 0314  INR 1.06    No results for input(s): DDIMER in the last 72 hours.  Cardiac Enzymes  Recent Labs Lab 12/12/14 0330 12/12/14 0910 12/12/14 1558 12/12/14 2317  CKMB 3.5  --   --   --   TROPONINI <0.30 <0.30 0.48* 0.37*    ------------------------------------------------------------------------------------------------------------------ Invalid input(s): POCBNP    Manning Luna D.O. on 12/17/2014 at 4:43 PM  Between 7am to 7pm - Pager - [phone]  After 7pm go to [email or web address] - password [email or web address]  And look for the night coverage person covering for me after hours  [facility] Office  [phone]

## 2014-12-17 NOTE — Procedures (Signed)
I have seen and examined this patient and agree with the plan of care . Seen on dilaysis . I appreciate the help from palliative care team. This is a heart breaking situation  [facility] W 12/17/2014, 9:04 AM

## 2014-12-17 NOTE — Progress Notes (Signed)
Progress Note from the Palliative Medicine Team at [facility]: [name] is resting and appears comfortable while she is in dialysis. She has ~40 min left with no complications per nursing. I left [name] information in the room for associations that deal with deaf-blind families. I sent an email to one association on their behalf to find out about any support or resources they can offer. As far as anything to put in place to improve quality of life -  everything I find requires use of braille. I will continue to discuss and support family.     Objective: No Known Allergies Scheduled Meds: . amiodarone  400 mg Oral Daily  . antiseptic oral rinse  7 mL Mouth Rinse q12n4p  . aspirin  81 mg Oral Daily  . atorvastatin  10 mg Oral Daily  . calcium acetate  667 mg Oral TID WC  . clopidogrel  75 mg Oral Daily  . darbepoetin (ARANESP) injection - DIALYSIS  60 mcg Intravenous Q Mon-HD  . feeding supplement (PRO-STAT SUGAR FREE 64)  30 mL Oral TID WC  . ferric gluconate (FERRLECIT/NULECIT) IV  125 mg Intravenous Q Mon-HD  . heparin  5,000 Units Subcutaneous 3 times per day  . insulin aspart  0-15 Units Subcutaneous Q6H  . lisinopril  2.5 mg Oral Daily  . metoprolol succinate  50 mg Oral Daily  . risperiDONE microspheres  25 mg Intramuscular Q14 Days  . sodium chloride  3 mL Intravenous Q12H   Continuous Infusions: . sodium chloride 10 mL/hr at 12/12/14 0445   PRN Meds:.sodium chloride, sodium chloride, acetaminophen **OR** acetaminophen, albuterol, alteplase, bisacodyl, feeding supplement (NEPRO CARB STEADY), heparin, levalbuterol, lidocaine (PF), lidocaine-prilocaine, nitroGLYCERIN, ondansetron **OR** ondansetron (ZOFRAN) IV, pentafluoroprop-tetrafluoroeth, promethazine  BP 125/65 mmHg  Pulse 73  Temp(Src) 97.5 F (36.4 C) (Oral)  Resp 13  Ht 5\' 6"  (1.676 m)  Wt 83.1 kg (183 lb 3.2 oz)  BMI 29.58 kg/m2  SpO2 99%   PPS: 30%   Intake/Output Summary (Last 24 hours) at  12/17/14 1130 Last data filed at 12/17/14 0600  Gross per 24 hour  Intake    660 ml  Output     50 ml  Net    610 ml      LBM: 12/17/14  Physical Exam:  General: NAD, well nourished HEENT: [place]/AT, no JVD, moist mucous membranes Chest: No labored breathing, symmetric CVS: RRR Abdomen: Soft, NT, ND Ext: MAE, no edema Neuro: Alert, orientation not able to assess d/t difficulty with communication  Labs: CBC    Component Value Date/Time   WBC 7.2 12/15/2014 0800   WBC 7.2 09/02/2014   RBC 2.87* 12/15/2014 0800   HGB 9.1* 12/15/2014 0800   HCT 28.1* 12/15/2014 0800   PLT 274 12/15/2014 0800   MCV 97.9 12/15/2014 0800   MCH 31.7 12/15/2014 0800   MCHC 32.4 12/15/2014 0800   RDW 14.3 12/15/2014 0800   LYMPHSABS 1.3 12/12/2014 0330   MONOABS 1.0 12/12/2014 0330   EOSABS 0.0 12/12/2014 0330   BASOSABS 0.0 12/12/2014 0330    BMET    Component Value Date/Time   NA 132* 12/16/2014 0331   K 4.2 12/16/2014 0331   CL 94* 12/16/2014 0331   CO2 28 12/16/2014 0331   GLUCOSE 137* 12/16/2014 0331   BUN 24* 12/16/2014 0331   BUN 64* 09/02/2014   CREATININE 3.74* 12/16/2014 0331   CREATININE 5.4* 09/02/2014   CREATININE 0.76 05/23/2011 0956   CALCIUM 8.6 12/16/2014 0331  GFRNONAA 12* 12/16/2014 0331   GFRAA 14* 12/16/2014 0331    CMP     Component Value Date/Time   NA 132* 12/16/2014 0331   K 4.2 12/16/2014 0331   CL 94* 12/16/2014 0331   CO2 28 12/16/2014 0331   GLUCOSE 137* 12/16/2014 0331   BUN 24* 12/16/2014 0331   BUN 64* 09/02/2014   CREATININE 3.74* 12/16/2014 0331   CREATININE 5.4* 09/02/2014   CREATININE 0.76 05/23/2011 0956   CALCIUM 8.6 12/16/2014 0331   PROT 8.4* 12/12/2014 0330   ALBUMIN 3.2* 12/14/2014 0825   AST 65* 12/12/2014 0330   ALT 67* 12/12/2014 0330   ALKPHOS 93 12/12/2014 0330   BILITOT 0.3 12/12/2014 0330   GFRNONAA 12* 12/16/2014 0331   GFRAA 14* 12/16/2014 0331    Assessment and Plan: 1. Code Status: FULL 2. Symptom  Control: 1. 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 Quality of life is very poor d/t lack of communication and interaction in her day to day life. She has only been blind ~1 year per family so this is an adjustment for them all.  3. Psycho/Social: Emotional support provided to family. Left information for son in room and left message for sister.  4. Disposition: Anticipate back to SNF when stable. Palliative care to follow.     Time In Time Out Total Time Spent with Patient Total Overall Time  1100 1125 46min 59min    Greater than 50%  of this time was spent counseling and coordinating care related to the above assessment and plan.  Vinie Sill, NP Palliative Medicine Team Pager # [phone] (M-F 8a-5p) Team Phone # [phone] (Nights/Weekends)   1

## 2014-12-17 NOTE — Progress Notes (Signed)
Transfer note:  Arrival Method: Bed from Dialysis Mental Orientation:Unable to assess Telemetry: Box 6E15 Assessment: See doc flowsheet Skin: Dry, intact IV: R A/C Pain: No signs of pain noted 6700 Orientation: Patient has been oriented to the unit, staff and to the room.

## 2014-12-18 LAB — BASIC METABOLIC PANEL
Anion gap: 12 (ref 5–15)
BUN: 40 mg/dL — ABNORMAL HIGH (ref 6–23)
CO2: 25 mmol/L (ref 19–32)
Calcium: 8.5 mg/dL (ref 8.4–10.5)
Chloride: 94 mEq/L — ABNORMAL LOW (ref 96–112)
Creatinine, Ser: 4.58 mg/dL — ABNORMAL HIGH (ref 0.50–1.10)
GFR calc Af Amer: 11 mL/min — ABNORMAL LOW (ref >90)
GFR calc non Af Amer: 10 mL/min — ABNORMAL LOW (ref >90)
Glucose, Bld: 108 mg/dL — ABNORMAL HIGH (ref 70–99)
Potassium: 4.4 mmol/L (ref 3.5–5.1)
Sodium: 131 mmol/L — ABNORMAL LOW (ref 135–145)

## 2014-12-18 LAB — CULTURE, BLOOD (ROUTINE X 2)
Culture: NO GROWTH
Culture: NO GROWTH

## 2014-12-18 LAB — CBC
HCT: 29.8 % — ABNORMAL LOW (ref 36.0–46.0)
Hemoglobin: 9.6 g/dL — ABNORMAL LOW (ref 12.0–15.0)
MCH: 31.1 pg (ref 26.0–34.0)
MCHC: 32.2 g/dL (ref 30.0–36.0)
MCV: 96.4 fL (ref 78.0–100.0)
Platelets: 282 10*3/uL (ref 150–400)
RBC: 3.09 MIL/uL — ABNORMAL LOW (ref 3.87–5.11)
RDW: 14.3 % (ref 11.5–15.5)
WBC: 8 10*3/uL (ref 4.0–10.5)

## 2014-12-18 LAB — GLUCOSE, CAPILLARY
Glucose-Capillary: 107 mg/dL — ABNORMAL HIGH (ref 70–99)
Glucose-Capillary: 115 mg/dL — ABNORMAL HIGH (ref 70–99)
Glucose-Capillary: 119 mg/dL — ABNORMAL HIGH (ref 70–99)
Glucose-Capillary: 131 mg/dL — ABNORMAL HIGH (ref 70–99)
Glucose-Capillary: 97 mg/dL (ref 70–99)

## 2014-12-18 NOTE — Progress Notes (Signed)
    Subjective:  Appears in no distress. Cannot communicate.   Objective:  Filed Vitals:   12/17/14 1236 12/17/14 1729 12/17/14 2118 12/18/14 0601  BP: 122/51 127/54 102/44 129/60  Pulse: 77 76 73 73  Temp: 98.6 F (37 C) 98.9 F (37.2 C) 99.4 F (37.4 C) 98.7 F (37.1 C)  TempSrc: Oral Oral Oral Oral  Resp: 18 16 17 18   Weight:   181 lb 14.1 oz (82.5 kg)   SpO2: 98% 99% 100% 98%    Intake/Output from previous day:  Intake/Output Summary (Last 24 hours) at 12/18/14 0720 Last data filed at 12/17/14 1155  Gross per 24 hour  Intake      0 ml  Output   3042 ml  Net  -3042 ml    Physical Exam: Physical exam: Well-developed in no acute distress.  Skin is warm and dry.  Neck is supple.  Chest is clear to auscultation with normal expansion anteriorly Cardiovascular exam is regular rate and rhythm.  Abdominal exam nontender or distended. No masses palpated. Extremities show no edema.    Lab Results: Basic Metabolic Panel:  Recent Labs  12/15/14 1715 12/16/14 0331 12/18/14 0436  NA  --  132* 131*  K  --  4.2 4.4  CL  --  94* 94*  CO2  --  28 25  GLUCOSE  --  137* 108*  BUN  --  24* 40*  CREATININE  --  3.74* 4.58*  CALCIUM  --  8.6 8.5  MG 2.1  --   --    CBC:  Recent Labs  12/15/14 0800 12/18/14 0436  WBC 7.2 8.0  HGB 9.1* 9.6*  HCT 28.1* 29.8*  MCV 97.9 96.4  PLT 274 282    Assessment/Plan:  1 VT-telemetry reviewed; patient in sinus with no further VT; continue amiodarone and toprol; felt not to be a candidate for ICD per Dr Debara Pickett. Palliative care consult initiated. 2 ICM with recent MI-continue ASA, plavix, statin, ACEI and toprol. 3 ESRD-dialysis per nephrology. 4 Deaf/blind/mute  Kirk Ruths 12/18/2014, 7:20 AM

## 2014-12-18 NOTE — Progress Notes (Signed)
[facility] ROUNDING NOTE   Subjective:   Interval History: no change in condition  Objective:  Vital signs in last 24 hours:  Temp:  [98.6 F (37 C)-99.4 F (37.4 C)] 98.7 F (37.1 C) (12/25 0940) Pulse Rate:  [73-77] 77 (12/25 0940) Resp:  [16-18] 18 (12/25 0940) BP: (102-129)/(44-60) 113/47 mmHg (12/25 0940) SpO2:  [98 %-100 %] 100 % (12/25 0940) Weight:  [82.5 kg (181 lb 14.1 oz)] 82.5 kg (181 lb 14.1 oz) (12/24 2118)  Weight change: -1.9 kg (-4 lb 3 oz) Filed Weights   12/17/14 0730 12/17/14 1155 12/17/14 2118  Weight: 83.1 kg (183 lb 3.2 oz) 80.3 kg (177 lb 0.5 oz) 82.5 kg (181 lb 14.1 oz)    Intake/Output: I/O last 3 completed shifts: In: 100 [P.O.:100] Out: 3042 [Other:3042]   Intake/Output this shift:  Total I/O In: 360 [P.O.:360] Out: -   CVS- RRR RS- CTA ABD- BS present soft non-distended EXT- no edema   Basic Metabolic Panel:  Recent Labs Lab 12/11/14 2333  12/14/14 0825 12/14/14 2009 12/15/14 0700 12/15/14 1715 12/16/14 0331 12/18/14 0436  NA 131*  < > 132* 131* 129*  --  132* 131*  K 3.7  < > 5.3 4.6 4.5  --  4.2 4.4  CL 90*  < > 91* 91* 91*  --  94* 94*  CO2 28  < > 24 26 24   --  28 25  GLUCOSE 275*  < > 110* 200* 118*  --  137* 108*  BUN 21  < > 41* 24* 32*  --  24* 40*  CREATININE 2.90*  < > 5.04* 3.13* 4.19*  --  3.74* 4.58*  CALCIUM 8.8  < > 9.0 8.9 8.6  --  8.6 8.5  MG 2.1  --   --   --   --  2.1  --   --   PHOS  --   --  4.6  --   --   --   --   --   < > = values in this interval not displayed.  Liver Function Tests:  Recent Labs Lab 12/11/14 2333 12/12/14 0330 12/14/14 0825  AST 79* 65*  --   ALT 68* 67*  --   ALKPHOS 82 93  --   BILITOT 0.3 0.3  --   PROT 7.5 8.4*  --   ALBUMIN 3.1* 3.6 3.2*    Recent Labs Lab 12/11/14 2333  LIPASE 50   No results for input(s): AMMONIA in the last 168 hours.  CBC:  Recent Labs Lab 12/11/14 2333  12/12/14 0330 12/12/14 0348 12/14/14 0824 12/15/14 0800  12/18/14 0436  WBC 7.7  --  12.4*  --  8.9 7.2 8.0  NEUTROABS 5.4  --  10.0*  --   --   --   --   HGB 9.6*  < > 10.8* 12.2 8.0* 9.1* 9.6*  HCT 30.0*  < > 33.1* 36.0 25.0* 28.1* 29.8*  MCV 98.0  --  99.7  --  97.7 97.9 96.4  PLT 335  --  387  --  296 274 282  < > = values in this interval not displayed.  Cardiac Enzymes:  Recent Labs Lab 12/12/14 0330 12/12/14 0910 12/12/14 1558 12/12/14 2317  CKTOTAL 108  --   --   --   CKMB 3.5  --   --   --   TROPONINI <0.30 <0.30 0.48* 0.37*    BNP: Invalid input(s): POCBNP  CBG:  Recent Labs Lab 12/17/14 0640 12/17/14 1259 12/17/14 1722 12/18/14 0030 12/18/14 0656  GLUCAP 118* 103* 147* 131* 115*    Microbiology: Results for orders placed or performed during the hospital encounter of 12/11/14  Culture, blood (routine x 2)     Status: None   Collection Time: 12/11/14 11:38 PM  Result Value Ref Range Status   Specimen Description BLOOD RIGHT ARM  Final   Special Requests BOTTLES DRAWN AEROBIC AND ANAEROBIC 5CC EACH  Final   Culture  Setup Time   Final    12/12/2014 02:51 Performed at [place]   Final    NO GROWTH 5 DAYS Performed at [facility]    Report Status 12/18/2014 FINAL  Final  Culture, blood (routine x 2)     Status: None   Collection Time: 12/11/14 11:40 PM  Result Value Ref Range Status   Specimen Description BLOOD RIGHT HAND  Final   Special Requests BOTTLES DRAWN AEROBIC AND ANAEROBIC 5CC EACH  Final   Culture  Setup Time   Final    12/12/2014 02:52 Performed at [place]   Final    NO GROWTH 5 DAYS Performed at [facility]    Report Status 12/18/2014 FINAL  Final  Urine culture     Status: None   Collection Time: 12/12/14 12:49 AM  Result Value Ref Range Status   Specimen Description URINE, RANDOM  Final   Special Requests Normal  Final   Culture  Setup Time   Final    12/12/2014 06:28 Performed at [place] Performed at [facility]   Final   Culture NO GROWTH Performed at [facility]   Final   Report Status 12/13/2014 FINAL  Final    Coagulation Studies: No results for input(s): LABPROT, INR in the last 72 hours.  Urinalysis: No results for input(s): COLORURINE, LABSPEC, PHURINE, GLUCOSEU, HGBUR, BILIRUBINUR, KETONESUR, PROTEINUR, UROBILINOGEN, NITRITE, LEUKOCYTESUR in the last 72 hours.  Invalid input(s): APPERANCEUR    Imaging: No results found.   Medications:   . sodium chloride 10 mL/hr at 12/12/14 0445   . amiodarone  400 mg Oral Daily  . antiseptic oral rinse  7 mL Mouth Rinse q12n4p  . aspirin  81 mg Oral Daily  . atorvastatin  10 mg Oral Daily  . calcium acetate  667 mg Oral TID WC  . clopidogrel  75 mg Oral Daily  . darbepoetin (ARANESP) injection - DIALYSIS  60 mcg Intravenous Q Mon-HD  . feeding supplement (PRO-STAT SUGAR FREE 64)  30 mL Oral TID WC  . ferric gluconate (FERRLECIT/NULECIT) IV  125 mg Intravenous Q Mon-HD  . heparin  5,000 Units Subcutaneous 3 times per day  . insulin aspart  0-15 Units Subcutaneous Q6H  . lisinopril  2.5 mg Oral Daily  . metoprolol succinate  50 mg Oral Daily  . risperiDONE microspheres  25 mg Intramuscular Q14 Days  . sodium chloride  3 mL Intravenous Q12H   acetaminophen **OR** acetaminophen, albuterol, bisacodyl, levalbuterol, nitroGLYCERIN, ondansetron **OR** ondansetron (ZOFRAN) IV, promethazine  Assessment/ Plan:  The patient is a 58 y.o. year-old with hx of HTN, deafness, blindness, ESRD, MI , ischemic CM EF 30-35% had LOC episode/ arrest at SNF. 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

## 2014-12-18 NOTE — Progress Notes (Signed)
[facility]                                                                              Patient Demographics  [name], is a 58 y.o. female, DOB - Mar 20, 1956, PQZ:[id number]  Admit date - 12/11/2014   Admitting Physician Berle Mull, MD  Outpatient Primary MD for the patient is No primary care provider on file.  LOS - 7   Chief Complaint  Patient presents with  . ams       Brief narrative: 58 year old female patient w/ known chronic kidney disease on dialysis, hypertension, recent admission for anterior wall MI medically managed, anemia which was fecal occult blood negative 2, and deaf and blind since childhood who was sent over from the nursing home after being found unconscious after her dialysis treatment on Friday. Nursing home personnel examined the patient and found her to be pulseless and CPR was initiated. An AED was applied and found a non-shockable rhythm. Patient had return of spontaneous circulation/pulse after ~ 6 minutes of chest compression and administration of intraosseous lidocaine. Before these events no apparent acute event or changes were reported by the nursing home staff.  Upon arrival to the emergency department she was alert and responsive. He was evaluated in the emergency department by cardiology. After arrival she developed ventricular tachycardia as well as acute dyspnea and hypertensive urgency. She was placed on BiPAP, given IV Lasix, IV nitroglycerin and IV amiodarone. It was suspected patient had a ventricular tach arrest prior to arrival. She stabilized and was able to be admitted to stepdown unit.  Shortly after admission to the nursing unit patient once again decompensated from a respiratory standpoint so critical care medicine was consulted and the patient was transferred to the ICU. Patient's respiratory symptoms responded to dialysis. Cardiology has documented that she is not a candidate for an AICD and recommended discussion with the family  about DO NOT RESUSCITATE status since she is likely to have recurrent V. tach in the future. Cardiology has documented that up titration of cardiac meds has been difficult due to ongoing hypotension/soft blood pressures. Dialysis has been accomplished without significant hypotension.  Palliative care met with patient's son. At this time patient's son is not ready to make patient DO NOT RESUSCITATE or comfort care. He understands that his mother is gravely ill however he is currently dealing with his father's illnesses as well.  Assessment & Plan   Cardiac arrest/sustained V. tach/ischemic cardiomyopathy -Likely secondary to underlying cardiomyopathy off a low EF -EF of 30-35% -Not a candidate for ICD -Cardiology consulted and appreciated -Continue amiodarone per cardiology, currently on oral form -Continue Plavix  ESRD requiring dialysis -Nephrology consult appreciated  Essential hypertension -Stable  Acute respiratory failure with hypoxia due to flash pulmonary edema -Status post arrest -Treated with dialysis -Appears to be resolved, patient stable on room air  Pulmonary hypertension with moderate regurgitation -Cardiology consulted and appreciated  Diabetes mellitus -Continue compliance with CBG monitoring  Uterine leiomyoma  Congenital mutism and deafness/blindness -Communicates through sign language using the palm of her hand with family  Anemia of chronic disease -Continue Aranesp with iron supplementation  Abnormal urinalysis consistent with UTI -Urine culture negative,  patient received 3 days of Rocephin  Code Status: Full  Family Communication: None at bedside, son at bedside  Disposition Plan: Admitted.  Patient appears to be stable.    Time Spent in minutes   30 minutes  Procedures  Echocardiogram: EF 30-35%  Consults   Cardiology Nephrology Palliative care PCCM  DVT Prophylaxis  heparin  Lab Results  Component Value Date   PLT 282 12/18/2014      Medications  Scheduled Meds: . amiodarone  400 mg Oral Daily  . antiseptic oral rinse  7 mL Mouth Rinse q12n4p  . aspirin  81 mg Oral Daily  . atorvastatin  10 mg Oral Daily  . calcium acetate  667 mg Oral TID WC  . clopidogrel  75 mg Oral Daily  . darbepoetin (ARANESP) injection - DIALYSIS  60 mcg Intravenous Q Mon-HD  . feeding supplement (PRO-STAT SUGAR FREE 64)  30 mL Oral TID WC  . ferric gluconate (FERRLECIT/NULECIT) IV  125 mg Intravenous Q Mon-HD  . heparin  5,000 Units Subcutaneous 3 times per day  . insulin aspart  0-15 Units Subcutaneous Q6H  . lisinopril  2.5 mg Oral Daily  . metoprolol succinate  50 mg Oral Daily  . risperiDONE microspheres  25 mg Intramuscular Q14 Days  . sodium chloride  3 mL Intravenous Q12H   Continuous Infusions: . sodium chloride 10 mL/hr at 12/12/14 0445   PRN Meds:.acetaminophen **OR** acetaminophen, albuterol, bisacodyl, levalbuterol, nitroGLYCERIN, ondansetron **OR** ondansetron (ZOFRAN) IV, promethazine  Antibiotics    Anti-infectives    Start     Dose/Rate Route Frequency Ordered Stop   12/13/14 0300  cefTRIAXone (ROCEPHIN) 1 g in dextrose 5 % 50 mL IVPB - Premix  Status:  Discontinued     1 g100 mL/hr over 30 Minutes Intravenous Every 24 hours 12/12/14 0314 12/14/14 1458   12/12/14 0200  cefTRIAXone (ROCEPHIN) 1 g in dextrose 5 % 50 mL IVPB     1 g100 mL/hr over 30 Minutes Intravenous  Once 12/12/14 0150 12/12/14 0342      Subjective:   Cloris Sieben seen and examined today.  Patient deaf, mute and blind. Eating breakfast with aid.   Objective:   Filed Vitals:   12/17/14 1729 12/17/14 2118 12/18/14 0601 12/18/14 0940  BP: 127/54 102/44 129/60 113/47  Pulse: 76 73 73 77  Temp: 98.9 F (37.2 C) 99.4 F (37.4 C) 98.7 F (37.1 C) 98.7 F (37.1 C)  TempSrc: Oral Oral Oral Oral  Resp: _0 Weight:  82.5 kg (181 lb 14.1 oz)    SpO2: 99% 100% 98% 100%    Wt Readings from Last 3 Encounters:  12/17/14 82.5 kg (181 lb  14.1 oz)  12/02/14 80.74 kg (178 lb)  12/01/14 80.74 kg (178 lb)     Intake/Output Summary (Last 24 hours) at 12/18/14 1155 Last data filed at 12/18/14 0913  Gross per 24 hour  Intake    360 ml  Output      0 ml  Net    360 ml    Exam  General: Well developed, well nourished, NAD, appears stated age  73: NCAT, mucous membranes moist.   Cardiovascular: S1 S2 auscultated,  Regular rate and rhythm.  Respiratory: Clear to auscultation bilaterally with equal chest rise  Abdomen: Soft, nontender, nondistended, + bowel sounds  Extremities: warm dry without cyanosis clubbing or edema   Data Review   Micro Results Recent Results (from the past 240 hour(s))  Culture, blood (  routine x 2)     Status: None   Collection Time: 12/11/14 11:38 PM  Result Value Ref Range Status   Specimen Description BLOOD RIGHT ARM  Final   Special Requests BOTTLES DRAWN AEROBIC AND ANAEROBIC 5CC EACH  Final   Culture  Setup Time   Final    12/12/2014 02:51 Performed at [facility]    Culture   Final    NO GROWTH 5 DAYS Performed at [facility]    Report Status 12/18/2014 FINAL  Final  Culture, blood (routine x 2)     Status: None   Collection Time: 12/11/14 11:40 PM  Result Value Ref Range Status   Specimen Description BLOOD RIGHT HAND  Final   Special Requests BOTTLES DRAWN AEROBIC AND ANAEROBIC [facility] EACH  Final   Culture  Setup Time   Final    12/12/2014 02:52 Performed at [place]   Final    NO GROWTH 5 DAYS Performed at [facility]    Report Status 12/18/2014 FINAL  Final  Urine culture     Status: None   Collection Time: 12/12/14 12:49 AM  Result Value Ref Range Status   Specimen Description URINE, RANDOM  Final   Special Requests Normal  Final   Culture  Setup Time   Final    12/12/2014 06:28 Performed at [place] Performed at [facility]   Final   Culture NO  GROWTH Performed at [facility]   Final   Report Status 12/13/2014 FINAL  Final    Radiology Reports Dg Chest 2 View  11/20/2014   CLINICAL DATA:  Respiratory distress and chest pain.  EXAM: CHEST  2 VIEW  COMPARISON:  01/02/2014.  FINDINGS: Mild cardiac enlargement. Small bilateral pleural effusions and mild to moderate interstitial edema is identified. No focal airspace consolidation identified. Spondylosis noted within the thoracic spine.  IMPRESSION: 1. Mild to moderate CHF.   Electronically Signed   By: Kerby Moors M.D.   On: 11/20/2014 00:34   Ct Head Wo Contrast  12/12/2014   CLINICAL DATA:  Acute onset of altered mental status. Status post last fall with asystole, status post CPR and resuscitation. Initial encounter. Concern for head or cervical spine injury.  EXAM: CT HEAD WITHOUT CONTRAST  CT CERVICAL SPINE WITHOUT CONTRAST  TECHNIQUE: Multidetector CT imaging of the head and cervical spine was performed following the standard protocol without intravenous contrast. Multiplanar CT image reconstructions of the cervical spine were also generated.  COMPARISON:  None.  FINDINGS: CT HEAD FINDINGS  There is no evidence of acute infarction, mass lesion, or intra- or extra-axial hemorrhage on CT.  Prominence of the ventricles and sulci reflects mild cortical volume loss. Scattered periventricular and subcortical white matter change likely reflects small vessel ischemic microangiopathy.  The brainstem and fourth ventricle are within normal limits. The basal ganglia are unremarkable in appearance. The cerebral hemispheres demonstrate grossly normal gray-white differentiation. No mass effect or midline shift is seen.  There is no evidence of fracture; visualized osseous structures are unremarkable in appearance. The visualized portions of the orbits are within normal limits. The paranasal sinuses and mastoid air cells are well-aerated. No significant soft tissue abnormalities are seen.  CT  CERVICAL SPINE FINDINGS  There is no evidence of fracture or subluxation. Vertebral bodies demonstrate normal height and alignment. There is mild narrowing of the intervertebral disc spaces at C3-C4 and  C4-C5, with associated anterior and posterior disc osteophyte complexes. Prevertebral soft tissues are within normal limits.  A 0.9 cm hypodensity is noted at the left thyroid lobe. There is mild interstitial prominence at the lung apices. No significant soft tissue abnormalities are seen.  IMPRESSION: 1. No evidence of traumatic intracranial injury or fracture. 2. No evidence of fracture or subluxation along the cervical spine. 3. Mild cortical volume loss and scattered small vessel ischemic microangiopathy. 4. Minimal degenerative change at the upper cervical spine. 5. Sub-centimeter thyroid nodule(s) noted, too small to characterize, but most likely benign in the absence of known clinical risk factors for thyroid carcinoma. 6. Mild interstitial prominence at the lung apices.   Electronically Signed   By: Garald Balding M.D.   On: 12/12/2014 00:02   Ct Angio Chest Pe W/cm &/or Wo Cm  12/12/2014   CLINICAL DATA:  Acute onset of chest pain and shortness of breath. Patient was unresponsive, with asystole, status post cardiac resuscitation. Initial encounter.  EXAM: CT ANGIOGRAPHY CHEST WITH CONTRAST  TECHNIQUE: Multidetector CT imaging of the chest was performed using the standard protocol during bolus administration of intravenous contrast. Multiplanar CT image reconstructions and MIPs were obtained to evaluate the vascular anatomy.  CONTRAST:  69m OMNIPAQUE IOHEXOL 350 MG/ML SOLN  COMPARISON:  Chest radiograph performed 12/11/2014  FINDINGS: There is no evidence of pulmonary embolus.  Mild peripheral nodular densities are thought to reflect atelectasis. There is slight interstitial prominence, without definite pulmonary edema. There is no evidence of significant focal consolidation, pleural effusion or  pneumothorax. No suspicious masses are identified; no abnormal focal contrast enhancement is seen.  There is mild diffuse peribronchial thickening, particularly at the hila. No definite mediastinal lymphadenopathy is seen. No pericardial effusion is identified. The great vessels are grossly unremarkable in appearance. No axillary lymphadenopathy is seen. Multiple hypodensities are seen within the thyroid gland, measuring up to 1.4 cm in size, with minimal calcification.  The visualized portions of the liver and spleen are unremarkable.  No acute osseous abnormalities are seen.  Review of the MIP images confirms the above findings.  IMPRESSION: 1. No evidence of pulmonary embolus. 2. Slight interstitial prominence noted, without definite pulmonary edema. Mild peripheral nodular densities are thought to reflect atelectasis. Lungs otherwise grossly clear. 3. Mild diffuse peribronchial thickening noted, nonspecific in appearance. 4. Multiple hypodensities within the thyroid gland measuring up to 1.4 cm in size. Consider further evaluation with thyroid ultrasound. If patient is clinically hyperthyroid, consider nuclear medicine thyroid uptake and scan.   Electronically Signed   By: JGarald BaldingM.D.   On: 12/12/2014 01:51   Ct Cervical Spine Wo Contrast  12/12/2014   CLINICAL DATA:  Acute onset of altered mental status. Status post last fall with asystole, status post CPR and resuscitation. Initial encounter. Concern for head or cervical spine injury.  EXAM: CT HEAD WITHOUT CONTRAST  CT CERVICAL SPINE WITHOUT CONTRAST  TECHNIQUE: Multidetector CT imaging of the head and cervical spine was performed following the standard protocol without intravenous contrast. Multiplanar CT image reconstructions of the cervical spine were also generated.  COMPARISON:  None.  FINDINGS: CT HEAD FINDINGS  There is no evidence of acute infarction, mass lesion, or intra- or extra-axial hemorrhage on CT.  Prominence of the ventricles and  sulci reflects mild cortical volume loss. Scattered periventricular and subcortical white matter change likely reflects small vessel ischemic microangiopathy.  The brainstem and fourth ventricle are within normal limits. The basal ganglia are unremarkable in appearance.  The cerebral hemispheres demonstrate grossly normal gray-white differentiation. No mass effect or midline shift is seen.  There is no evidence of fracture; visualized osseous structures are unremarkable in appearance. The visualized portions of the orbits are within normal limits. The paranasal sinuses and mastoid air cells are well-aerated. No significant soft tissue abnormalities are seen.  CT CERVICAL SPINE FINDINGS  There is no evidence of fracture or subluxation. Vertebral bodies demonstrate normal height and alignment. There is mild narrowing of the intervertebral disc spaces at C3-C4 and C4-C5, with associated anterior and posterior disc osteophyte complexes. Prevertebral soft tissues are within normal limits.  A 0.9 cm hypodensity is noted at the left thyroid lobe. There is mild interstitial prominence at the lung apices. No significant soft tissue abnormalities are seen.  IMPRESSION: 1. No evidence of traumatic intracranial injury or fracture. 2. No evidence of fracture or subluxation along the cervical spine. 3. Mild cortical volume loss and scattered small vessel ischemic microangiopathy. 4. Minimal degenerative change at the upper cervical spine. 5. Sub-centimeter thyroid nodule(s) noted, too small to characterize, but most likely benign in the absence of known clinical risk factors for thyroid carcinoma. 6. Mild interstitial prominence at the lung apices.   Electronically Signed   By: Garald Balding M.D.   On: 12/12/2014 00:02   Nm Myocar Multi W/spect W/wall Motion / Ef  11/24/2014   CLINICAL DATA:  58 year old with suspected anterior wall myocardial infarction. Medical history is significant for end-stage renal disease and diabetes.   EXAM: MYOCARDIAL IMAGING WITH SPECT (REST AND PHARMACOLOGIC-STRESS)  GATED LEFT VENTRICULAR WALL MOTION STUDY  LEFT VENTRICULAR EJECTION FRACTION  TECHNIQUE: Standard myocardial SPECT imaging was performed after resting intravenous injection of 10 mCi Tc-63msestamibi. Subsequently, intravenous infusion of Lexiscan was performed under the supervision of the Cardiology staff. At peak effect of the drug, 30 mCi Tc-992mestamibi was injected intravenously and standard myocardial SPECT imaging was performed. Quantitative gated imaging was also performed to evaluate left ventricular wall motion, and estimate left ventricular ejection fraction.  COMPARISON:  Chest radiograph 11/20/2014  FINDINGS: Perfusion: Fixed defect involving the apex. There is a fixed defect involving the anterior lateral wall at the apex and mid segment. Fixed defect along the inferior wall in the mid and basilar segments. No evidence for reversibility.  Wall Motion: Diffuse hypokinesia. Minimal wall motion in the lateral wall.  Left Ventricular Ejection Fraction: 35 %  End diastolic volume 20202l  End systolic volume 13542l  IMPRESSION: 1. Fixed defects involving the apex, anterior lateral wall and the inferior wall. The apex and anterior lateral wall fixed defects are suggestive for an infarct. No evidence for reversibility or ischemia.  2. Diffuse hypokinesia and minimal wall motion along the lateral wall.  3. Left ventricular ejection fraction is 35%.  4. High-risk stress test findings*.  *2012 Appropriate Use Criteria for Coronary Revascularization Focused Update: J Am Coll Cardiol. 207062;37(6):283-151http://content.[email or web address]?articleid=1201161   Electronically Signed   By: AdMarkus Daft.D.   On: 11/24/2014 17:20   Dg Chest Port 1 View  12/15/2014   CLINICAL DATA:  Acute onset of respiratory failure. Initial encounter.  EXAM: PORTABLE CHEST - 1 VIEW  COMPARISON:  Chest radiograph performed 12/13/2014  FINDINGS: The lungs  are hypoexpanded. Mild vascular crowding and vascular congestion are seen, with question of minimal interstitial edema. There is no evidence of pleural effusion or pneumothorax.  The cardiomediastinal silhouette is borderline enlarged. No acute osseous abnormalities are seen.  IMPRESSION: Lungs hypoexpanded. Vascular  congestion and borderline cardiomegaly, with question of minimal interstitial edema.   Electronically Signed   By: Garald Balding M.D.   On: 12/15/2014 00:08   Dg Chest Port 1 View  12/13/2014   CLINICAL DATA:  Pulmonary edema  EXAM: PORTABLE CHEST - 1 VIEW  COMPARISON:  December 12, 2014  FINDINGS: There is less edema compared to 1 day prior. There is currently trace interstitial edema. There is subtle alveolar opacity in each lung base and right mid lung. Suspect edema, although small foci of pneumonia could present similarly. No new areas of opacity identified. Heart is mildly enlarged with mild pulmonary venous hypertension. No adenopathy. There are small cervical ribs bilaterally, larger on the left than on the right.  IMPRESSION: Less edema compared to 1 day prior. Scattered foci of alveolar opacity may represent either localized edema or pneumonia. Both entities may exist concurrently. No new opacity. No change in cardiac silhouette. The appearance of the pulmonary vasculature suggests a degree of volume overload.   Electronically Signed   By: Lowella Grip M.D.   On: 12/13/2014 07:03   Dg Chest Port 1 View  12/12/2014   CLINICAL DATA:  Acute onset of shortness of breath. Assess for flash pulmonary edema.  EXAM: PORTABLE CHEST - 1 VIEW  COMPARISON:  Chest radiograph from 12/11/2014  FINDINGS: The lungs are well-aerated. Mild right-sided airspace opacities could reflect mildly asymmetric interstitial edema. There is trace fluid along the right minor fissure. No pneumothorax is identified.  The cardiomediastinal silhouette is borderline normal in size. An external pacing pad is noted  overlying the left hemithorax. No acute osseous abnormalities are seen.  IMPRESSION: Mild right-sided airspace opacities could reflect mildly asymmetric interstitial edema. Trace fluid along the right minor fissure.   Electronically Signed   By: Garald Balding M.D.   On: 12/12/2014 04:55   Dg Chest Port 1 View  12/12/2014   CLINICAL DATA:  Status post cardiac arrest and resuscitation. Initial encounter.  EXAM: PORTABLE CHEST - 1 VIEW  COMPARISON:  Chest radiograph from 01/02/2014  FINDINGS: There is elevation of the right hemidiaphragm. There is no evidence of focal opacification, pleural effusion or pneumothorax.  The cardiomediastinal silhouette is borderline normal in size. No acute osseous abnormalities are seen. A small left cervical rib is incidentally noted.  IMPRESSION: 1. Elevation of the right hemidiaphragm; lungs remain grossly clear. 2. Small left cervical rib incidentally noted.   Electronically Signed   By: Garald Balding M.D.   On: 12/12/2014 00:04    CBC  Recent Labs Lab 12/11/14 2333  12/12/14 0330 12/12/14 0348 12/14/14 0824 12/15/14 0800 12/18/14 0436  WBC 7.7  --  12.4*  --  8.9 7.2 8.0  HGB 9.6*  < > 10.8* 12.2 8.0* 9.1* 9.6*  HCT 30.0*  < > 33.1* 36.0 25.0* 28.1* 29.8*  PLT 335  --  387  --  296 274 282  MCV 98.0  --  99.7  --  97.7 97.9 96.4  MCH 31.4  --  32.5  --  31.3 31.7 31.1  MCHC 32.0  --  32.6  --  32.0 32.4 32.2  RDW 14.3  --  14.4  --  14.7 14.3 14.3  LYMPHSABS 1.3  --  1.3  --   --   --   --   MONOABS 0.8  --  1.0  --   --   --   --   EOSABS 0.1  --  0.0  --   --   --   --  BASOSABS 0.1  --  0.0  --   --   --   --   < > = values in this interval not displayed.  Chemistries   Recent Labs Lab 12/11/14 2333  12/12/14 0330  12/14/14 0825 12/14/14 2009 12/15/14 0700 12/15/14 1715 12/16/14 0331 12/18/14 0436  NA 131*  < > 135*  < > 132* 131* 129*  --  132* 131*  K 3.7  < > 4.4  < > 5.3 4.6 4.5  --  4.2 4.4  CL 90*  < > 92*  < > 91* 91* 91*  --   94* 94*  CO2 28  --  28  --  _0 --  28 25  GLUCOSE 275*  < > 271*  < > 110* 200* 118*  --  137* 108*  BUN 21  < > 23  < > 41* 24* 32*  --  24* 40*  CREATININE 2.90*  < > 2.91*  < > 5.04* 3.13* 4.19*  --  3.74* 4.58*  CALCIUM 8.8  --  9.2  --  9.0 8.9 8.6  --  8.6 8.5  MG 2.1  --   --   --   --   --   --  2.1  --   --   AST 79*  --  65*  --   --   --   --   --   --   --   ALT 68*  --  67*  --   --   --   --   --   --   --   ALKPHOS 82  --  93  --   --   --   --   --   --   --   BILITOT 0.3  --  0.3  --   --   --   --   --   --   --   < > = values in this interval not displayed. ------------------------------------------------------------------------------------------------------------------ estimated creatinine clearance is 14.5 mL/min (by C-G formula based on Cr of 4.58). ------------------------------------------------------------------------------------------------------------------ No results for input(s): HGBA1C in the last 72 hours. ------------------------------------------------------------------------------------------------------------------ No results for input(s): CHOL, HDL, LDLCALC, TRIG, CHOLHDL, LDLDIRECT in the last 72 hours. ------------------------------------------------------------------------------------------------------------------ No results for input(s): TSH, T4TOTAL, T3FREE, THYROIDAB in the last 72 hours.  Invalid input(s): FREET3 ------------------------------------------------------------------------------------------------------------------ No results for input(s): VITAMINB12, FOLATE, FERRITIN, TIBC, IRON, RETICCTPCT in the last 72 hours.  Coagulation profile  Recent Labs Lab 12/12/14 0314  INR 1.06    No results for input(s): DDIMER in the last 72 hours.  Cardiac Enzymes  Recent Labs Lab 12/12/14 0330 12/12/14 0910 12/12/14 1558 12/12/14 2317  CKMB 3.5  --   --   --   TROPONINI <0.30 <0.30 0.48* 0.37*    ------------------------------------------------------------------------------------------------------------------ Invalid input(s): POCBNP    Ciara Kagan D.O. on 12/18/2014 at 11:55 AM  Between 7am to 7pm - Pager - [phone]  After 7pm go to [email or web address] - password [email or web address]  And look for the night coverage person covering for me after hours  [facility] Office  [phone]

## 2014-12-19 LAB — RENAL FUNCTION PANEL
Albumin: 2.8 g/dL — ABNORMAL LOW (ref 3.5–5.2)
Anion gap: 12 (ref 5–15)
BUN: 66 mg/dL — ABNORMAL HIGH (ref 6–23)
CO2: 23 mmol/L (ref 19–32)
Calcium: 8.7 mg/dL (ref 8.4–10.5)
Chloride: 88 mEq/L — ABNORMAL LOW (ref 96–112)
Creatinine, Ser: 6.48 mg/dL — ABNORMAL HIGH (ref 0.50–1.10)
GFR calc Af Amer: 7 mL/min — ABNORMAL LOW (ref >90)
GFR calc non Af Amer: 6 mL/min — ABNORMAL LOW (ref >90)
Glucose, Bld: 115 mg/dL — ABNORMAL HIGH (ref 70–99)
Phosphorus: 5.7 mg/dL — ABNORMAL HIGH (ref 2.3–4.6)
Potassium: 4.3 mmol/L (ref 3.5–5.1)
Sodium: 123 mmol/L — ABNORMAL LOW (ref 135–145)

## 2014-12-19 LAB — CBC
HCT: 29 % — ABNORMAL LOW (ref 36.0–46.0)
Hemoglobin: 9.5 g/dL — ABNORMAL LOW (ref 12.0–15.0)
MCH: 30.6 pg (ref 26.0–34.0)
MCHC: 32.8 g/dL (ref 30.0–36.0)
MCV: 93.5 fL (ref 78.0–100.0)
Platelets: 316 10*3/uL (ref 150–400)
RBC: 3.1 MIL/uL — ABNORMAL LOW (ref 3.87–5.11)
RDW: 13.9 % (ref 11.5–15.5)
WBC: 8.1 10*3/uL (ref 4.0–10.5)

## 2014-12-19 LAB — GLUCOSE, CAPILLARY
Glucose-Capillary: 121 mg/dL — ABNORMAL HIGH (ref 70–99)
Glucose-Capillary: 140 mg/dL — ABNORMAL HIGH (ref 70–99)
Glucose-Capillary: 109 mg/dL — ABNORMAL HIGH (ref 70–99)

## 2014-12-19 LAB — BASIC METABOLIC PANEL
Anion gap: 13 (ref 5–15)
BUN: 62 mg/dL — ABNORMAL HIGH (ref 6–23)
CO2: 24 mmol/L (ref 19–32)
Calcium: 8.6 mg/dL (ref 8.4–10.5)
Chloride: 87 mEq/L — ABNORMAL LOW (ref 96–112)
Creatinine, Ser: 6.17 mg/dL — ABNORMAL HIGH (ref 0.50–1.10)
GFR calc Af Amer: 8 mL/min — ABNORMAL LOW (ref >90)
GFR calc non Af Amer: 7 mL/min — ABNORMAL LOW (ref >90)
Glucose, Bld: 122 mg/dL — ABNORMAL HIGH (ref 70–99)
Potassium: 4.9 mmol/L (ref 3.5–5.1)
Sodium: 124 mmol/L — ABNORMAL LOW (ref 135–145)

## 2014-12-19 NOTE — Progress Notes (Signed)
[facility]                                                                              Patient Demographics  [name], is a 58 y.o. female, DOB - [birth year], OEU:[id number]  Admit date - 12/11/2014   Admitting Physician Berle Mull, MD  Outpatient Primary MD for the patient is No primary care provider on file.  LOS - 8   Chief Complaint  Patient presents with  . ams       Brief narrative: 58 year old female patient w/ known chronic kidney disease on dialysis, hypertension, recent admission for anterior wall MI medically managed, anemia which was fecal occult blood negative 2, and deaf and blind since childhood who was sent over from the nursing home after being found unconscious after her dialysis treatment on Friday. Nursing home personnel examined the patient and found her to be pulseless and CPR was initiated. An AED was applied and found a non-shockable rhythm. Patient had return of spontaneous circulation/pulse after ~ 6 minutes of chest compression and administration of intraosseous lidocaine. Before these events no apparent acute event or changes were reported by the nursing home staff.  Upon arrival to the emergency department she was alert and responsive. He was evaluated in the emergency department by cardiology. After arrival she developed ventricular tachycardia as well as acute dyspnea and hypertensive urgency. She was placed on BiPAP, given IV Lasix, IV nitroglycerin and IV amiodarone. It was suspected patient had a ventricular tach arrest prior to arrival. She stabilized and was able to be admitted to stepdown unit.  Shortly after admission to the nursing unit patient once again decompensated from a respiratory standpoint so critical care medicine was consulted and the patient was transferred to the ICU. Patient's respiratory symptoms responded to dialysis. Cardiology has documented that she is not a candidate for an AICD and recommended discussion with the family  about DO NOT RESUSCITATE status since she is likely to have recurrent V. tach in the future. Cardiology has documented that up titration of cardiac meds has been difficult due to ongoing hypotension/soft blood pressures. Dialysis has been accomplished without significant hypotension.  Palliative care met with patient's son. At this time patient's son is not ready to make patient DO NOT RESUSCITATE or comfort care  Assessment & Plan   Cardiac arrest/sustained V. tach/ischemic cardiomyopathy -EF of 30-35% -Not a candidate for ICD -Continue amiodarone per cardiology Son wants to continue full code, aggressive care. Back to SNF Monday if ok with consultants  ESRD requiring dialysis HD tomorrow  Essential hypertension -Stable  Acute respiratory failure with hypoxia due to flash pulmonary edema resolved  Pulmonary hypertension with moderate regurgitation  Diabetes mellitus stable  Congenital mutism and deafness/blindness -Communicates through sign language using the palm of her hand with family  Anemia  ESRD stable  Abnormal urinalysis consistent with UTI -Urine culture negative, patient received 3 days of Rocephin  Code Status: Full  Family Communication: None at bedside, son at bedside  Disposition Plan: SNF Monday if stable   Time Spent in minutes   30 minutes  Procedures  Echocardiogram: EF 30-35%  Consults   Cardiology Nephrology Palliative care PCCM  DVT  Prophylaxis  heparin  Lab Results  Component Value Date   PLT 316 12/19/2014    Medications  Scheduled Meds: . amiodarone  400 mg Oral Daily  . antiseptic oral rinse  7 mL Mouth Rinse q12n4p  . aspirin  81 mg Oral Daily  . atorvastatin  10 mg Oral Daily  . calcium acetate  667 mg Oral TID WC  . clopidogrel  75 mg Oral Daily  . darbepoetin (ARANESP) injection - DIALYSIS  60 mcg Intravenous Q Mon-HD  . feeding supplement (PRO-STAT SUGAR FREE 64)  30 mL Oral TID WC  . ferric gluconate  (FERRLECIT/NULECIT) IV  125 mg Intravenous Q Mon-HD  . heparin  5,000 Units Subcutaneous 3 times per day  . insulin aspart  0-15 Units Subcutaneous Q6H  . lisinopril  2.5 mg Oral Daily  . metoprolol succinate  50 mg Oral Daily  . risperiDONE microspheres  25 mg Intramuscular Q14 Days  . sodium chloride  3 mL Intravenous Q12H   Continuous Infusions: . sodium chloride 10 mL/hr at 12/12/14 0445   PRN Meds:.acetaminophen **OR** acetaminophen, albuterol, bisacodyl, levalbuterol, nitroGLYCERIN, ondansetron **OR** ondansetron (ZOFRAN) IV, promethazine  Antibiotics    Anti-infectives    Start     Dose/Rate Route Frequency Ordered Stop   12/13/14 0300  cefTRIAXone (ROCEPHIN) 1 g in dextrose 5 % 50 mL IVPB - Premix  Status:  Discontinued     1 g100 mL/hr over 30 Minutes Intravenous Every 24 hours 12/12/14 0314 12/14/14 1458   12/12/14 0200  cefTRIAXone (ROCEPHIN) 1 g in dextrose 5 % 50 mL IVPB     1 g100 mL/hr over 30 Minutes Intravenous  Once 12/12/14 0150 12/12/14 0342      Subjective:   unable  Objective:   Filed Vitals:   12/18/14 1634 12/18/14 2100 12/19/14 0500 12/19/14 0930  BP: 111/49 121/65 128/53 134/61  Pulse: 73 67 67 73  Temp: 99.4 F (37.4 C) 98.3 F (36.8 C) 98.3 F (36.8 C) 98.4 F (36.9 C)  TempSrc: Oral Oral Oral Oral  Resp:  _0 Weight:  83.1 kg (183 lb 3.2 oz) 83.3 kg (183 lb 10.3 oz)   SpO2: 100% 99% 99% 98%    Wt Readings from Last 3 Encounters:  12/19/14 83.3 kg (183 lb 10.3 oz)  12/02/14 80.74 kg (178 lb)  12/01/14 80.74 kg (178 lb)     Intake/Output Summary (Last 24 hours) at 12/19/14 1812 Last data filed at 12/19/14 1300  Gross per 24 hour  Intake    843 ml  Output      0 ml  Net    843 ml    Exam  General: eyes open. Nonverbal. Appears comfortable  Cardiovascular: S1 S2 auscultated,  Regular rate and rhythm. Without MGR  Respiratory: Clear to auscultation bilaterally without WRR  Abdomen: Soft, nontender, nondistended, + bowel  sounds  Extremities: warm dry without cyanosis clubbing or edema   Data Review   Micro Results Recent Results (from the past 240 hour(s))  Culture, blood (routine x 2)     Status: None   Collection Time: 12/11/14 11:38 PM  Result Value Ref Range Status   Specimen Description BLOOD RIGHT ARM  Final   Special Requests BOTTLES DRAWN AEROBIC AND ANAEROBIC 5CC EACH  Final   Culture  Setup Time   Final    12/12/2014 02:51 Performed at [place]   Final    NO GROWTH 5 DAYS Performed at  [facility]    Report Status 12/18/2014 FINAL  Final  Culture, blood (routine x 2)     Status: None   Collection Time: 12/11/14 11:40 PM  Result Value Ref Range Status   Specimen Description BLOOD RIGHT HAND  Final   Special Requests BOTTLES DRAWN AEROBIC AND ANAEROBIC 5CC EACH  Final   Culture  Setup Time   Final    12/12/2014 02:52 Performed at [place]   Final    NO GROWTH 5 DAYS Performed at [facility]    Report Status 12/18/2014 FINAL  Final  Urine culture     Status: None   Collection Time: 12/12/14 12:49 AM  Result Value Ref Range Status   Specimen Description URINE, RANDOM  Final   Special Requests Normal  Final   Culture  Setup Time   Final    12/12/2014 06:28 Performed at [place] Performed at [facility]   Final   Culture NO GROWTH Performed at [facility]   Final   Report Status 12/13/2014 FINAL  Final    Radiology Reports Dg Chest 2 View  11/20/2014   CLINICAL DATA:  Respiratory distress and chest pain.  EXAM: CHEST  2 VIEW  COMPARISON:  01/02/2014.  FINDINGS: Mild cardiac enlargement. Small bilateral pleural effusions and mild to moderate interstitial edema is identified. No focal airspace consolidation identified. Spondylosis noted within the thoracic spine.  IMPRESSION: 1. Mild to moderate CHF.   Electronically Signed   By: Kerby Moors M.D.   On:  11/20/2014 00:34   Ct Head Wo Contrast  12/12/2014   CLINICAL DATA:  Acute onset of altered mental status. Status post last fall with asystole, status post CPR and resuscitation. Initial encounter. Concern for head or cervical spine injury.  EXAM: CT HEAD WITHOUT CONTRAST  CT CERVICAL SPINE WITHOUT CONTRAST  TECHNIQUE: Multidetector CT imaging of the head and cervical spine was performed following the standard protocol without intravenous contrast. Multiplanar CT image reconstructions of the cervical spine were also generated.  COMPARISON:  None.  FINDINGS: CT HEAD FINDINGS  There is no evidence of acute infarction, mass lesion, or intra- or extra-axial hemorrhage on CT.  Prominence of the ventricles and sulci reflects mild cortical volume loss. Scattered periventricular and subcortical white matter change likely reflects small vessel ischemic microangiopathy.  The brainstem and fourth ventricle are within normal limits. The basal ganglia are unremarkable in appearance. The cerebral hemispheres demonstrate grossly normal gray-white differentiation. No mass effect or midline shift is seen.  There is no evidence of fracture; visualized osseous structures are unremarkable in appearance. The visualized portions of the orbits are within normal limits. The paranasal sinuses and mastoid air cells are well-aerated. No significant soft tissue abnormalities are seen.  CT CERVICAL SPINE FINDINGS  There is no evidence of fracture or subluxation. Vertebral bodies demonstrate normal height and alignment. There is mild narrowing of the intervertebral disc spaces at C3-C4 and C4-C5, with associated anterior and posterior disc osteophyte complexes. Prevertebral soft tissues are within normal limits.  A 0.9 cm hypodensity is noted at the left thyroid lobe. There is mild interstitial prominence at the lung apices. No significant soft tissue abnormalities are seen.  IMPRESSION: 1. No evidence of traumatic intracranial injury or  fracture. 2. No evidence of fracture or subluxation along the cervical spine. 3. Mild cortical volume loss and scattered small vessel ischemic microangiopathy. 4. Minimal  degenerative change at the upper cervical spine. 5. Sub-centimeter thyroid nodule(s) noted, too small to characterize, but most likely benign in the absence of known clinical risk factors for thyroid carcinoma. 6. Mild interstitial prominence at the lung apices.   Electronically Signed   By: Garald Balding M.D.   On: 12/12/2014 00:02   Ct Angio Chest Pe W/cm &/or Wo Cm  12/12/2014   CLINICAL DATA:  Acute onset of chest pain and shortness of breath. Patient was unresponsive, with asystole, status post cardiac resuscitation. Initial encounter.  EXAM: CT ANGIOGRAPHY CHEST WITH CONTRAST  TECHNIQUE: Multidetector CT imaging of the chest was performed using the standard protocol during bolus administration of intravenous contrast. Multiplanar CT image reconstructions and MIPs were obtained to evaluate the vascular anatomy.  CONTRAST:  33m OMNIPAQUE IOHEXOL 350 MG/ML SOLN  COMPARISON:  Chest radiograph performed 12/11/2014  FINDINGS: There is no evidence of pulmonary embolus.  Mild peripheral nodular densities are thought to reflect atelectasis. There is slight interstitial prominence, without definite pulmonary edema. There is no evidence of significant focal consolidation, pleural effusion or pneumothorax. No suspicious masses are identified; no abnormal focal contrast enhancement is seen.  There is mild diffuse peribronchial thickening, particularly at the hila. No definite mediastinal lymphadenopathy is seen. No pericardial effusion is identified. The great vessels are grossly unremarkable in appearance. No axillary lymphadenopathy is seen. Multiple hypodensities are seen within the thyroid gland, measuring up to 1.4 cm in size, with minimal calcification.  The visualized portions of the liver and spleen are unremarkable.  No acute osseous  abnormalities are seen.  Review of the MIP images confirms the above findings.  IMPRESSION: 1. No evidence of pulmonary embolus. 2. Slight interstitial prominence noted, without definite pulmonary edema. Mild peripheral nodular densities are thought to reflect atelectasis. Lungs otherwise grossly clear. 3. Mild diffuse peribronchial thickening noted, nonspecific in appearance. 4. Multiple hypodensities within the thyroid gland measuring up to 1.4 cm in size. Consider further evaluation with thyroid ultrasound. If patient is clinically hyperthyroid, consider nuclear medicine thyroid uptake and scan.   Electronically Signed   By: JGarald BaldingM.D.   On: 12/12/2014 01:51   Ct Cervical Spine Wo Contrast  12/12/2014   CLINICAL DATA:  Acute onset of altered mental status. Status post last fall with asystole, status post CPR and resuscitation. Initial encounter. Concern for head or cervical spine injury.  EXAM: CT HEAD WITHOUT CONTRAST  CT CERVICAL SPINE WITHOUT CONTRAST  TECHNIQUE: Multidetector CT imaging of the head and cervical spine was performed following the standard protocol without intravenous contrast. Multiplanar CT image reconstructions of the cervical spine were also generated.  COMPARISON:  None.  FINDINGS: CT HEAD FINDINGS  There is no evidence of acute infarction, mass lesion, or intra- or extra-axial hemorrhage on CT.  Prominence of the ventricles and sulci reflects mild cortical volume loss. Scattered periventricular and subcortical white matter change likely reflects small vessel ischemic microangiopathy.  The brainstem and fourth ventricle are within normal limits. The basal ganglia are unremarkable in appearance. The cerebral hemispheres demonstrate grossly normal gray-white differentiation. No mass effect or midline shift is seen.  There is no evidence of fracture; visualized osseous structures are unremarkable in appearance. The visualized portions of the orbits are within normal limits. The  paranasal sinuses and mastoid air cells are well-aerated. No significant soft tissue abnormalities are seen.  CT CERVICAL SPINE FINDINGS  There is no evidence of fracture or subluxation. Vertebral bodies demonstrate normal height and alignment. There is  mild narrowing of the intervertebral disc spaces at C3-C4 and C4-C5, with associated anterior and posterior disc osteophyte complexes. Prevertebral soft tissues are within normal limits.  A 0.9 cm hypodensity is noted at the left thyroid lobe. There is mild interstitial prominence at the lung apices. No significant soft tissue abnormalities are seen.  IMPRESSION: 1. No evidence of traumatic intracranial injury or fracture. 2. No evidence of fracture or subluxation along the cervical spine. 3. Mild cortical volume loss and scattered small vessel ischemic microangiopathy. 4. Minimal degenerative change at the upper cervical spine. 5. Sub-centimeter thyroid nodule(s) noted, too small to characterize, but most likely benign in the absence of known clinical risk factors for thyroid carcinoma. 6. Mild interstitial prominence at the lung apices.   Electronically Signed   By: Garald Balding M.D.   On: 12/12/2014 00:02   Nm Myocar Multi W/spect W/wall Motion / Ef  11/24/2014   CLINICAL DATA:  58 year old with suspected anterior wall myocardial infarction. Medical history is significant for end-stage renal disease and diabetes.  EXAM: MYOCARDIAL IMAGING WITH SPECT (REST AND PHARMACOLOGIC-STRESS)  GATED LEFT VENTRICULAR WALL MOTION STUDY  LEFT VENTRICULAR EJECTION FRACTION  TECHNIQUE: Standard myocardial SPECT imaging was performed after resting intravenous injection of 10 mCi Tc-3msestamibi. Subsequently, intravenous infusion of Lexiscan was performed under the supervision of the Cardiology staff. At peak effect of the drug, 30 mCi Tc-916mestamibi was injected intravenously and standard myocardial SPECT imaging was performed. Quantitative gated imaging was also performed  to evaluate left ventricular wall motion, and estimate left ventricular ejection fraction.  COMPARISON:  Chest radiograph 11/20/2014  FINDINGS: Perfusion: Fixed defect involving the apex. There is a fixed defect involving the anterior lateral wall at the apex and mid segment. Fixed defect along the inferior wall in the mid and basilar segments. No evidence for reversibility.  Wall Motion: Diffuse hypokinesia. Minimal wall motion in the lateral wall.  Left Ventricular Ejection Fraction: 35 %  End diastolic volume 20027l  End systolic volume 13253l  IMPRESSION: 1. Fixed defects involving the apex, anterior lateral wall and the inferior wall. The apex and anterior lateral wall fixed defects are suggestive for an infarct. No evidence for reversibility or ischemia.  2. Diffuse hypokinesia and minimal wall motion along the lateral wall.  3. Left ventricular ejection fraction is 35%.  4. High-risk stress test findings*.  *2012 Appropriate Use Criteria for Coronary Revascularization Focused Update: J Am Coll Cardiol. 206644;03(4):742-595http://[email or web address]?articleid=1201161   Electronically Signed   By: AdMarkus Daft.D.   On: 11/24/2014 17:20   Dg Chest Port 1 View  12/15/2014   CLINICAL DATA:  Acute onset of respiratory failure. Initial encounter.  EXAM: PORTABLE CHEST - 1 VIEW  COMPARISON:  Chest radiograph performed 12/13/2014  FINDINGS: The lungs are hypoexpanded. Mild vascular crowding and vascular congestion are seen, with question of minimal interstitial edema. There is no evidence of pleural effusion or pneumothorax.  The cardiomediastinal silhouette is borderline enlarged. No acute osseous abnormalities are seen.  IMPRESSION: Lungs hypoexpanded. Vascular congestion and borderline cardiomegaly, with question of minimal interstitial edema.   Electronically Signed   By: JeGarald Balding.D.   On: 12/15/2014 00:08   Dg Chest Port 1 View  12/13/2014   CLINICAL DATA:  Pulmonary edema  EXAM:  PORTABLE CHEST - 1 VIEW  COMPARISON:  December 12, 2014  FINDINGS: There is less edema compared to 1 day prior. There is currently trace interstitial edema. There is subtle alveolar opacity in each  lung base and right mid lung. Suspect edema, although small foci of pneumonia could present similarly. No new areas of opacity identified. Heart is mildly enlarged with mild pulmonary venous hypertension. No adenopathy. There are small cervical ribs bilaterally, larger on the left than on the right.  IMPRESSION: Less edema compared to 1 day prior. Scattered foci of alveolar opacity may represent either localized edema or pneumonia. Both entities may exist concurrently. No new opacity. No change in cardiac silhouette. The appearance of the pulmonary vasculature suggests a degree of volume overload.   Electronically Signed   By: Lowella Grip M.D.   On: 12/13/2014 07:03   Dg Chest Port 1 View  12/12/2014   CLINICAL DATA:  Acute onset of shortness of breath. Assess for flash pulmonary edema.  EXAM: PORTABLE CHEST - 1 VIEW  COMPARISON:  Chest radiograph from 12/11/2014  FINDINGS: The lungs are well-aerated. Mild right-sided airspace opacities could reflect mildly asymmetric interstitial edema. There is trace fluid along the right minor fissure. No pneumothorax is identified.  The cardiomediastinal silhouette is borderline normal in size. An external pacing pad is noted overlying the left hemithorax. No acute osseous abnormalities are seen.  IMPRESSION: Mild right-sided airspace opacities could reflect mildly asymmetric interstitial edema. Trace fluid along the right minor fissure.   Electronically Signed   By: Garald Balding M.D.   On: 12/12/2014 04:55   Dg Chest Port 1 View  12/12/2014   CLINICAL DATA:  Status post cardiac arrest and resuscitation. Initial encounter.  EXAM: PORTABLE CHEST - 1 VIEW  COMPARISON:  Chest radiograph from 01/02/2014  FINDINGS: There is elevation of the right hemidiaphragm. There is no  evidence of focal opacification, pleural effusion or pneumothorax.  The cardiomediastinal silhouette is borderline normal in size. No acute osseous abnormalities are seen. A small left cervical rib is incidentally noted.  IMPRESSION: 1. Elevation of the right hemidiaphragm; lungs remain grossly clear. 2. Small left cervical rib incidentally noted.   Electronically Signed   By: Garald Balding M.D.   On: 12/12/2014 00:04    CBC  Recent Labs Lab 12/14/14 0824 12/15/14 0800 12/18/14 0436 12/19/14 1225  WBC 8.9 7.2 8.0 8.1  HGB 8.0* 9.1* 9.6* 9.5*  HCT 25.0* 28.1* 29.8* 29.0*  PLT 296 274 282 316  MCV 97.7 97.9 96.4 93.5  MCH 31.3 31.7 31.1 30.6  MCHC 32.0 32.4 32.2 32.8  RDW 14.7 14.3 14.3 13.9    Chemistries   Recent Labs Lab 12/15/14 0700 12/15/14 1715 12/16/14 0331 12/18/14 0436 12/19/14 0428 12/19/14 1225  NA 129*  --  132* 131* 124* 123*  K 4.5  --  4.2 4.4 4.9 4.3  CL 91*  --  94* 94* 87* 88*  CO2 24  --  _0 GLUCOSE 118*  --  137* 108* 122* 115*  BUN 32*  --  24* 40* 62* 66*  CREATININE 4.19*  --  3.74* 4.58* 6.17* 6.48*  CALCIUM 8.6  --  8.6 8.5 8.6 8.7  MG  --  2.1  --   --   --   --    ------------------------------------------------------------------------------------------------------------------ estimated creatinine clearance is 10.3 mL/min (by C-G formula based on Cr of 6.48). ------------------------------------------------------------------------------------------------------------------ No results for input(s): HGBA1C in the last 72 hours. ------------------------------------------------------------------------------------------------------------------ No results for input(s): CHOL, HDL, LDLCALC, TRIG, CHOLHDL, LDLDIRECT in the last 72 hours. ------------------------------------------------------------------------------------------------------------------ No results for input(s): TSH, T4TOTAL, T3FREE, THYROIDAB in the last 72 hours.  Invalid  input(s): FREET3 ------------------------------------------------------------------------------------------------------------------ No  results for input(s): VITAMINB12, FOLATE, FERRITIN, TIBC, IRON, RETICCTPCT in the last 72 hours.  Coagulation profile No results for input(s): INR, PROTIME in the last 168 hours.  No results for input(s): DDIMER in the last 72 hours.  Cardiac Enzymes  Recent Labs Lab 12/12/14 2317  TROPONINI 0.37*   ------------------------------------------------------------------------------------------------------------------ Invalid input(s): POCBNP    Hilmar Moldovan L . on 12/19/2014 at 6:12 PM  [email or web address] - password [email or web address]  [facility]

## 2014-12-19 NOTE — Progress Notes (Signed)
    Subjective:  Appears in no distress. Cannot communicate.   Objective:  Filed Vitals:   12/18/14 0940 12/18/14 1634 12/18/14 2100 12/19/14 0500  BP: 113/47 111/49 121/65 128/53  Pulse: 77 73 67 67  Temp: 98.7 F (37.1 C) 99.4 F (37.4 C) 98.3 F (36.8 C) 98.3 F (36.8 C)  TempSrc: Oral Oral Oral Oral  Resp: 18  18 18   Weight:   183 lb 3.2 oz (83.1 kg) 183 lb 10.3 oz (83.3 kg)  SpO2: 100% 100% 99% 99%    Intake/Output from previous day:  Intake/Output Summary (Last 24 hours) at 12/19/14 0847 Last data filed at 12/18/14 2122  Gross per 24 hour  Intake    963 ml  Output      0 ml  Net    963 ml    Physical Exam: Physical exam: Well-developed in no acute distress.  Skin is warm and dry.  Neck is supple.  Chest is clear to auscultation with normal expansion anteriorly Cardiovascular exam is regular rate and rhythm.  Abdominal exam nontender or distended. No masses palpated. Extremities show no edema.    Lab Results: Basic Metabolic Panel:  Recent Labs  12/18/14 0436 12/19/14 0428  NA 131* 124*  K 4.4 4.9  CL 94* 87*  CO2 25 24  GLUCOSE 108* 122*  BUN 40* 62*  CREATININE 4.58* 6.17*  CALCIUM 8.5 8.6   CBC:  Recent Labs  12/18/14 0436  WBC 8.0  HGB 9.6*  HCT 29.8*  MCV 96.4  PLT 282    Assessment/Plan:  1 VT-telemetry reviewed; patient in sinus with no further VT; continue amiodarone and toprol; felt not to be a candidate for ICD per Dr Debara Pickett. Palliative care consult initiated. 2 ICM with recent MI-continue ASA, plavix, statin, ACEI and toprol. 3 ESRD-dialysis per nephrology. 4 Deaf/blind/mute  [name] 12/19/2014, 8:47 AM

## 2014-12-19 NOTE — Progress Notes (Signed)
Rt Note:  Pt had a PRN bipap order.  Pt is in no distat this time.  Rt will continue to monitor.

## 2014-12-19 NOTE — Progress Notes (Signed)
Subjective:   Currently eating, indicates no complaints, in no apparent distress  Objective: Vital signs in last 24 hours: Temp:  [98.3 F (36.8 C)-99.4 F (37.4 C)] 98.4 F (36.9 C) (12/26 0930) Pulse Rate:  [67-73] 73 (12/26 0930) Resp:  [17-18] 17 (12/26 0930) BP: (111-134)/(49-65) 134/61 mmHg (12/26 0930) SpO2:  [98 %-100 %] 98 % (12/26 0930) Weight:  [83.1 kg (183 lb 3.2 oz)-83.3 kg (183 lb 10.3 oz)] 83.3 kg (183 lb 10.3 oz) (12/26 0500) Weight change: 0 kg (0 lb)  Intake/Output from previous day: 12/25 0701 - 12/26 0700 In: 963 [P.O.:960; I.V.:3] Out: -  Intake/Output this shift:  Lab Results:  Recent Labs  12/18/14 0436  WBC 8.0  HGB 9.6*  HCT 29.8*  PLT 282   BMET:  Recent Labs  12/18/14 0436 12/19/14 0428  NA 131* 124*  K 4.4 4.9  CL 94* 87*  CO2 25 24  GLUCOSE 108* 122*  BUN 40* 62*  CREATININE 4.58* 6.17*  CALCIUM 8.5 8.6   No results for input(s): PTH in the last 72 hours. Iron Studies: No results for input(s): IRON, TIBC, TRANSFERRIN, FERRITIN in the last 72 hours.  Studies/Results: No results found.   EXAM: General appearance:  Alert, in no apparent distress Resp:  CTA without rales, rhonchi, or wheezes Cardio:  RRR without murmur or rub  GI:   + BS, soft and nontender Extremities:  No edema Access:  AVF @ [facility] with + bruit    HD: [facility] 4h 73min 400/800 79kg 2/2.5 [place] [facility] AVF Heparin 9000 Aranesp 40/wk, Venofer 100/wk  Hb 9.1, pth 202  Assessment/Plan: 1. Syncopal episode - possible arrest @ SNF, AED did not recognize VT/VF, but received CPR for ~6 mins, possible respiratory arrest sec to pulmonary edema & volume overload. 2. ESRD - HD on MWF @ [place], K 4.9.  HD pending. 3. HTN/Volume - BP 134/61, Lisinopril 2.5 mg qd, Metoprolol 50 mg qd; wt 83.3 kg, improving (extra HD 12/22) with HD today. 4. Anemia - Hgb 9.6, Aranesp 60 mcg & Fe on Mon. 5. 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 6. CAD/ischemic CM - EF 30-35% per echo  12/20. 7. DM Type 2 - per primary.   LOS: 8 days   [name] 12/19/2014,10:16 AM

## 2014-12-20 LAB — CBC
HCT: 26.6 % — ABNORMAL LOW (ref 36.0–46.0)
Hemoglobin: 9 g/dL — ABNORMAL LOW (ref 12.0–15.0)
MCH: 30.9 pg (ref 26.0–34.0)
MCHC: 33.8 g/dL (ref 30.0–36.0)
MCV: 91.4 fL (ref 78.0–100.0)
Platelets: 322 10*3/uL (ref 150–400)
RBC: 2.91 MIL/uL — ABNORMAL LOW (ref 3.87–5.11)
RDW: 13.7 % (ref 11.5–15.5)
WBC: 6.7 10*3/uL (ref 4.0–10.5)

## 2014-12-20 LAB — OCCULT BLOOD X 1 CARD TO LAB, STOOL: Fecal Occult Bld: NEGATIVE

## 2014-12-20 LAB — HEPATITIS B SURFACE ANTIGEN: Hepatitis B Surface Ag: NEGATIVE

## 2014-12-20 LAB — GLUCOSE, CAPILLARY
Glucose-Capillary: 115 mg/dL — ABNORMAL HIGH (ref 70–99)
Glucose-Capillary: 121 mg/dL — ABNORMAL HIGH (ref 70–99)

## 2014-12-20 MED ORDER — LIDOCAINE-PRILOCAINE 2.5-2.5 % EX CREA: 1.0000 "application " | TOPICAL_CREAM | CUTANEOUS | Status: DC | PRN

## 2014-12-20 MED ORDER — HEPARIN SODIUM (PORCINE) 1000 UNIT/ML DIALYSIS: 1000.0000 [IU] | INTRAMUSCULAR | Status: DC | PRN

## 2014-12-20 MED ORDER — NEPRO/CARBSTEADY PO LIQD: 237.0000 mL | ORAL | Status: DC | PRN

## 2014-12-20 MED ORDER — PENTAFLUOROPROP-TETRAFLUOROETH EX AERO: 1.0000 "application " | INHALATION_SPRAY | CUTANEOUS | Status: DC | PRN

## 2014-12-20 MED ORDER — HEPARIN SODIUM (PORCINE) 1000 UNIT/ML DIALYSIS
9000.0000 [IU] | Freq: Once | INTRAMUSCULAR | Status: AC
  Administered 2014-12-20: 9000 [IU] via INTRAVENOUS_CENTRAL

## 2014-12-20 MED ORDER — SODIUM CHLORIDE 0.9 % IV SOLN: 100.0000 mL | INTRAVENOUS | Status: DC | PRN

## 2014-12-20 MED ORDER — LIDOCAINE HCL (PF) 1 % IJ SOLN: 5.0000 mL | INTRAMUSCULAR | Status: DC | PRN

## 2014-12-20 MED ORDER — ALTEPLASE 2 MG IJ SOLR
2.0000 mg | Freq: Once | INTRAMUSCULAR | Status: DC | PRN
  Filled 2014-12-20: qty 2

## 2014-12-20 NOTE — Progress Notes (Signed)
Patient ID: [name], female   DOB: 1956-08-14, 58 y.o.   MRN: [id number]  [facility] Progress Note   Assessment/ Plan:   1. S/P Cardiac arrest @ SNF (likely a sustained V Tach episode) received CPR for ~6 mins. Resuscitated successfully but not a candidate for ICD placement. Son has elected for FULL CODE and aggressive measures after meeting with palliative care. 2. ESRD - HD on MWF @ [place]. Ongoing HD today and will order for HD tomorrow again prior to possible DC thereafter. Hyponatremia likely from excess free water---monitor with UF 3. HTN/Volume - well controlled on current medications and UF on HD 4. Anemia - Hgb 9.0, continue ESA and IV Fe 5. 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 6. CAD/ischemic CM - EF 30-35% per echo 12/20. 7. DM Type 2 - per primary.  Subjective:   Appears comfortable--cannot communicate (no interpreter available while in HD). No acute events overnight   Objective:   BP 118/63 mmHg  Pulse 74  Temp(Src) 97.7 F (36.5 C) (Oral)  Resp 20  Ht 5\' 6"  (1.676 m)  Wt 85.7 kg (188 lb 15 oz)  BMI 30.51 kg/m2  SpO2 97%  Physical Exam: [id number] on HD [id number] RRR, normal s1 and s2 Resp:CTA bilaterally, no rales [id number], obese, NT, BS normal Ext:No LE edema noted  Labs: BMET  Recent Labs Lab 12/14/14 0825 12/14/14 2009 12/15/14 0700 12/16/14 0331 12/18/14 0436 12/19/14 0428 12/19/14 1225 12/20/14 0730  NA 132* 131* 129* 132* 131* 124* 123* 119*  K 5.3 4.6 4.5 4.2 4.4 4.9 4.3 4.7  CL 91* 91* 91* 94* 94* 87* 88* 85*  CO2 24 26 24 28 25 24 23 22   GLUCOSE 110* 200* 118* 137* 108* 122* 115* 110*  BUN 41* 24* 32* 24* 40* 62* 66* 80*  CREATININE 5.04* 3.13* 4.19* 3.74* 4.58* 6.17* 6.48* 7.23*  CALCIUM 9.0 8.9 8.6 8.6 8.5 8.6 8.7 8.2*  PHOS 4.6  --   --   --   --   --  5.7* 6.5*   CBC  Recent Labs Lab 12/15/14 0800 12/18/14 0436 12/19/14 1225 12/20/14 0717  WBC 7.2 8.0 8.1 6.7  HGB 9.1* 9.6* 9.5* 9.0*  HCT 28.1*  29.8* 29.0* 26.6*  MCV 97.9 96.4 93.5 91.4  PLT 274 282 316 322   Medications:    . amiodarone  400 mg Oral Daily  . antiseptic oral rinse  7 mL Mouth Rinse q12n4p  . aspirin  81 mg Oral Daily  . atorvastatin  10 mg Oral Daily  . calcium acetate  667 mg Oral TID WC  . clopidogrel  75 mg Oral Daily  . darbepoetin (ARANESP) injection - DIALYSIS  60 mcg Intravenous Q Mon-HD  . feeding supplement (PRO-STAT SUGAR FREE 64)  30 mL Oral TID WC  . ferric gluconate (FERRLECIT/NULECIT) IV  125 mg Intravenous Q Mon-HD  . heparin  5,000 Units Subcutaneous 3 times per day  . insulin aspart  0-15 Units Subcutaneous Q6H  . lisinopril  2.5 mg Oral Daily  . metoprolol succinate  50 mg Oral Daily  . risperiDONE microspheres  25 mg Intramuscular Q14 Days  . sodium chloride  3 mL Intravenous Q12H   Elmarie Shiley, MD 12/20/2014, 9:46 AM

## 2014-12-20 NOTE — Procedures (Signed)
Patient seen on Hemodialysis. QB 400, UF goal 4L Treatment adjusted as needed.  Elmarie Shiley MD [facility]. Office # [phone] Pager # [phone] 9:44 AM

## 2014-12-20 NOTE — Progress Notes (Addendum)
CRITICAL VALUE ALERT  Critical value received: Sodium 119  Date of notification: 12/20/14  Time of notification:  [id number] (Pt was in Hemodialysis)  Critical value read back:Yes.    Nurse who received alert:  Carole Civil  MD notified (1st page):  Dr. Conley Canal  Responding MD: Dr. Conley Canal  Details: Per MD this was being address in Hemodialyis

## 2014-12-20 NOTE — Progress Notes (Signed)
[facility]                                                                              Patient Demographics  [name], is a 58 y.o. female, DOB - 09-10-1956, TWS:[id number]  Admit date - 12/11/2014   Admitting Physician Berle Mull, MD  Outpatient Primary MD for the patient is No primary care provider on file.  LOS - 9   Chief Complaint  Patient presents with  . ams       Brief narrative: 58 year old female patient w/ known chronic kidney disease on dialysis, hypertension, recent admission for anterior wall MI medically managed, anemia which was fecal occult blood negative 2, and deaf and blind since childhood who was sent over from the nursing home after being found unconscious after her dialysis treatment on Friday. Nursing home personnel examined the patient and found her to be pulseless and CPR was initiated. An AED was applied and found a non-shockable rhythm. Patient had return of spontaneous circulation/pulse after ~ 6 minutes of chest compression and administration of intraosseous lidocaine. Before these events no apparent acute event or changes were reported by the nursing home staff.  Upon arrival to the emergency department she was alert and responsive. He was evaluated in the emergency department by cardiology. After arrival she developed ventricular tachycardia as well as acute dyspnea and hypertensive urgency. She was placed on BiPAP, given IV Lasix, IV nitroglycerin and IV amiodarone. It was suspected patient had a ventricular tach arrest prior to arrival. She stabilized and was able to be admitted to stepdown unit.  Shortly after admission to the nursing unit patient once again decompensated from a respiratory standpoint so critical care medicine was consulted and the patient was transferred to the ICU. Patient's respiratory symptoms responded to dialysis. Cardiology has documented that she is not a candidate for an AICD and recommended discussion with the family  about DO NOT RESUSCITATE status since she is likely to have recurrent V. tach in the future. Cardiology has documented that up titration of cardiac meds has been difficult due to ongoing hypotension/soft blood pressures. Dialysis has been accomplished without significant hypotension.  Palliative care met with patient's son. At this time patient's son is not ready to make patient DO NOT RESUSCITATE or comfort care  Assessment & Plan   Cardiac arrest/sustained V. tach/ischemic cardiomyopathy -EF of 30-35% -Not a candidate for ICD -Continue amiodarone per cardiology Son wants to continue full code, aggressive care. Back to SNF Monday if stable  ESRD requiring dialysis HD tomorrow  Essential hypertension -Stable  Acute respiratory failure with hypoxia due to flash pulmonary edema resolved  Pulmonary hypertension with moderate regurgitation  Diabetes mellitus stable  Congenital mutism and deafness/blindness -Communicates through sign language using the palm of her hand with family  Anemia  ESRD stable  Abnormal urinalysis consistent with UTI -Urine culture negative, patient received 3 days of Rocephin  Code Status: Full  Family Communication: None at bedside, son at bedside  Disposition Plan: SNF Monday if stable   Time Spent in minutes   15 minutes  Procedures  Echocardiogram: EF 30-35%  Consults   Cardiology Nephrology Palliative care PCCM  DVT Prophylaxis  heparin  Lab Results  Component Value Date   PLT 322 12/20/2014    Medications  Scheduled Meds: . amiodarone  400 mg Oral Daily  . antiseptic oral rinse  7 mL Mouth Rinse q12n4p  . aspirin  81 mg Oral Daily  . atorvastatin  10 mg Oral Daily  . calcium acetate  667 mg Oral TID WC  . clopidogrel  75 mg Oral Daily  . darbepoetin (ARANESP) injection - DIALYSIS  60 mcg Intravenous Q Mon-HD  . feeding supplement (PRO-STAT SUGAR FREE 64)  30 mL Oral TID WC  . ferric gluconate (FERRLECIT/NULECIT) IV  125  mg Intravenous Q Mon-HD  . heparin  5,000 Units Subcutaneous 3 times per day  . insulin aspart  0-15 Units Subcutaneous Q6H  . lisinopril  2.5 mg Oral Daily  . metoprolol succinate  50 mg Oral Daily  . risperiDONE microspheres  25 mg Intramuscular Q14 Days  . sodium chloride  3 mL Intravenous Q12H   Continuous Infusions: . sodium chloride 10 mL/hr at 12/12/14 0445   PRN Meds:.acetaminophen **OR** acetaminophen, albuterol, bisacodyl, levalbuterol, nitroGLYCERIN, ondansetron **OR** ondansetron (ZOFRAN) IV, promethazine  Antibiotics    Anti-infectives    Start     Dose/Rate Route Frequency Ordered Stop   12/13/14 0300  cefTRIAXone (ROCEPHIN) 1 g in dextrose 5 % 50 mL IVPB - Premix  Status:  Discontinued     1 g100 mL/hr over 30 Minutes Intravenous Every 24 hours 12/12/14 0314 12/14/14 1458   12/12/14 0200  cefTRIAXone (ROCEPHIN) 1 g in dextrose 5 % 50 mL IVPB     1 g100 mL/hr over 30 Minutes Intravenous  Once 12/12/14 0150 12/12/14 0342      Subjective:   unable  Objective:   Filed Vitals:   12/20/14 1015 12/20/14 1045 12/20/14 1118 12/20/14 1130  BP: 117/59 134/65 115/63 128/61  Pulse: 74 73 73 75  Temp:    97.5 F (36.4 C)  TempSrc:    Oral  Resp: 18 11 11 13   Weight:    81.2 kg (179 lb 0.2 oz)  SpO2:    99%    Wt Readings from Last 3 Encounters:  12/20/14 81.2 kg (179 lb 0.2 oz)  12/02/14 80.74 kg (178 lb)  12/01/14 80.74 kg (178 lb)     Intake/Output Summary (Last 24 hours) at 12/20/14 1310 Last data filed at 12/20/14 1130  Gross per 24 hour  Intake    120 ml  Output   3501 ml  Net  -3381 ml    Exam  General: eyes open. Nonverbal. Appears comfortable  Cardiovascular: S1 S2 auscultated,  Regular rate and rhythm. Without MGR  Respiratory: Clear to auscultation bilaterally without WRR  Abdomen: Soft, nontender, nondistended, + bowel sounds  Extremities: warm dry without cyanosis clubbing or edema   Data Review   Micro Results Recent Results (from  the past 240 hour(s))  Culture, blood (routine x 2)     Status: None   Collection Time: 12/11/14 11:38 PM  Result Value Ref Range Status   Specimen Description BLOOD RIGHT ARM  Final   Special Requests BOTTLES DRAWN AEROBIC AND ANAEROBIC 5CC EACH  Final   Culture  Setup Time   Final    12/12/2014 02:51 Performed at [facility]   Final    NO GROWTH 5 DAYS Performed at [facility]    Report Status 12/18/2014 FINAL  Final  Culture, blood (routine x 2)  Status: None   Collection Time: 12/11/14 11:40 PM  Result Value Ref Range Status   Specimen Description BLOOD RIGHT HAND  Final   Special Requests BOTTLES DRAWN AEROBIC AND ANAEROBIC [facility] EACH  Final   Culture  Setup Time   Final    12/12/2014 02:52 Performed at [facility]   Final    NO GROWTH 5 DAYS Performed at [facility]    Report Status 12/18/2014 FINAL  Final  Urine culture     Status: None   Collection Time: 12/12/14 12:49 AM  Result Value Ref Range Status   Specimen Description URINE, RANDOM  Final   Special Requests Normal  Final   Culture  Setup Time   Final    12/12/2014 06:28 Performed at [facility] Performed at [facility]   Final   Culture NO GROWTH Performed at [facility]   Final   Report Status 12/13/2014 FINAL  Final    Radiology Reports Ct Head Wo Contrast  12/12/2014   CLINICAL DATA:  Acute onset of altered mental status. Status post last fall with asystole, status post CPR and resuscitation. Initial encounter. Concern for head or cervical spine injury.  EXAM: CT HEAD WITHOUT CONTRAST  CT CERVICAL SPINE WITHOUT CONTRAST  TECHNIQUE: Multidetector CT imaging of the head and cervical spine was performed following the standard protocol without intravenous contrast. Multiplanar CT image reconstructions of the cervical spine were also generated.  COMPARISON:  None.  FINDINGS: CT HEAD FINDINGS   There is no evidence of acute infarction, mass lesion, or intra- or extra-axial hemorrhage on CT.  Prominence of the ventricles and sulci reflects mild cortical volume loss. Scattered periventricular and subcortical white matter change likely reflects small vessel ischemic microangiopathy.  The brainstem and fourth ventricle are within normal limits. The basal ganglia are unremarkable in appearance. The cerebral hemispheres demonstrate grossly normal gray-white differentiation. No mass effect or midline shift is seen.  There is no evidence of fracture; visualized osseous structures are unremarkable in appearance. The visualized portions of the orbits are within normal limits. The paranasal sinuses and mastoid air cells are well-aerated. No significant soft tissue abnormalities are seen.  CT CERVICAL SPINE FINDINGS  There is no evidence of fracture or subluxation. Vertebral bodies demonstrate normal height and alignment. There is mild narrowing of the intervertebral disc spaces at C3-C4 and C4-C5, with associated anterior and posterior disc osteophyte complexes. Prevertebral soft tissues are within normal limits.  A 0.9 cm hypodensity is noted at the left thyroid lobe. There is mild interstitial prominence at the lung apices. No significant soft tissue abnormalities are seen.  IMPRESSION: 1. No evidence of traumatic intracranial injury or fracture. 2. No evidence of fracture or subluxation along the cervical spine. 3. Mild cortical volume loss and scattered small vessel ischemic microangiopathy. 4. Minimal degenerative change at the upper cervical spine. 5. Sub-centimeter thyroid nodule(s) noted, too small to characterize, but most likely benign in the absence of known clinical risk factors for thyroid carcinoma. 6. Mild interstitial prominence at the lung apices.   Electronically Signed   By: Garald Balding M.D.   On: 12/12/2014 00:02   Ct Angio Chest Pe W/cm &/or Wo Cm  12/12/2014   CLINICAL DATA:  Acute onset  of chest pain and shortness of breath. Patient was unresponsive, with asystole, status post cardiac resuscitation. Initial encounter.  EXAM: CT ANGIOGRAPHY CHEST WITH CONTRAST  TECHNIQUE:  Multidetector CT imaging of the chest was performed using the standard protocol during bolus administration of intravenous contrast. Multiplanar CT image reconstructions and MIPs were obtained to evaluate the vascular anatomy.  CONTRAST:  68m OMNIPAQUE IOHEXOL 350 MG/ML SOLN  COMPARISON:  Chest radiograph performed 12/11/2014  FINDINGS: There is no evidence of pulmonary embolus.  Mild peripheral nodular densities are thought to reflect atelectasis. There is slight interstitial prominence, without definite pulmonary edema. There is no evidence of significant focal consolidation, pleural effusion or pneumothorax. No suspicious masses are identified; no abnormal focal contrast enhancement is seen.  There is mild diffuse peribronchial thickening, particularly at the hila. No definite mediastinal lymphadenopathy is seen. No pericardial effusion is identified. The great vessels are grossly unremarkable in appearance. No axillary lymphadenopathy is seen. Multiple hypodensities are seen within the thyroid gland, measuring up to 1.4 cm in size, with minimal calcification.  The visualized portions of the liver and spleen are unremarkable.  No acute osseous abnormalities are seen.  Review of the MIP images confirms the above findings.  IMPRESSION: 1. No evidence of pulmonary embolus. 2. Slight interstitial prominence noted, without definite pulmonary edema. Mild peripheral nodular densities are thought to reflect atelectasis. Lungs otherwise grossly clear. 3. Mild diffuse peribronchial thickening noted, nonspecific in appearance. 4. Multiple hypodensities within the thyroid gland measuring up to 1.4 cm in size. Consider further evaluation with thyroid ultrasound. If patient is clinically hyperthyroid, consider nuclear medicine thyroid uptake  and scan.   Electronically Signed   By: JGarald BaldingM.D.   On: 12/12/2014 01:51   Ct Cervical Spine Wo Contrast  12/12/2014   CLINICAL DATA:  Acute onset of altered mental status. Status post last fall with asystole, status post CPR and resuscitation. Initial encounter. Concern for head or cervical spine injury.  EXAM: CT HEAD WITHOUT CONTRAST  CT CERVICAL SPINE WITHOUT CONTRAST  TECHNIQUE: Multidetector CT imaging of the head and cervical spine was performed following the standard protocol without intravenous contrast. Multiplanar CT image reconstructions of the cervical spine were also generated.  COMPARISON:  None.  FINDINGS: CT HEAD FINDINGS  There is no evidence of acute infarction, mass lesion, or intra- or extra-axial hemorrhage on CT.  Prominence of the ventricles and sulci reflects mild cortical volume loss. Scattered periventricular and subcortical white matter change likely reflects small vessel ischemic microangiopathy.  The brainstem and fourth ventricle are within normal limits. The basal ganglia are unremarkable in appearance. The cerebral hemispheres demonstrate grossly normal gray-white differentiation. No mass effect or midline shift is seen.  There is no evidence of fracture; visualized osseous structures are unremarkable in appearance. The visualized portions of the orbits are within normal limits. The paranasal sinuses and mastoid air cells are well-aerated. No significant soft tissue abnormalities are seen.  CT CERVICAL SPINE FINDINGS  There is no evidence of fracture or subluxation. Vertebral bodies demonstrate normal height and alignment. There is mild narrowing of the intervertebral disc spaces at C3-C4 and C4-C5, with associated anterior and posterior disc osteophyte complexes. Prevertebral soft tissues are within normal limits.  A 0.9 cm hypodensity is noted at the left thyroid lobe. There is mild interstitial prominence at the lung apices. No significant soft tissue abnormalities  are seen.  IMPRESSION: 1. No evidence of traumatic intracranial injury or fracture. 2. No evidence of fracture or subluxation along the cervical spine. 3. Mild cortical volume loss and scattered small vessel ischemic microangiopathy. 4. Minimal degenerative change at the upper cervical spine. 5. Sub-centimeter thyroid nodule(s) noted,  too small to characterize, but most likely benign in the absence of known clinical risk factors for thyroid carcinoma. 6. Mild interstitial prominence at the lung apices.   Electronically Signed   By: Garald Balding M.D.   On: 12/12/2014 00:02   Nm Myocar Multi W/spect W/wall Motion / Ef  11/24/2014   CLINICAL DATA:  58 year old with suspected anterior wall myocardial infarction. Medical history is significant for end-stage renal disease and diabetes.  EXAM: MYOCARDIAL IMAGING WITH SPECT (REST AND PHARMACOLOGIC-STRESS)  GATED LEFT VENTRICULAR WALL MOTION STUDY  LEFT VENTRICULAR EJECTION FRACTION  TECHNIQUE: Standard myocardial SPECT imaging was performed after resting intravenous injection of 10 mCi Tc-46msestamibi. Subsequently, intravenous infusion of Lexiscan was performed under the supervision of the Cardiology staff. At peak effect of the drug, 30 mCi [id number] was injected intravenously and standard myocardial SPECT imaging was performed. Quantitative gated imaging was also performed to evaluate left ventricular wall motion, and estimate left ventricular ejection fraction.  COMPARISON:  Chest radiograph 11/20/2014  FINDINGS: Perfusion: Fixed defect involving the apex. There is a fixed defect involving the anterior lateral wall at the apex and mid segment. Fixed defect along the inferior wall in the mid and basilar segments. No evidence for reversibility.  Wall Motion: Diffuse hypokinesia. Minimal wall motion in the lateral wall.  Left Ventricular Ejection Fraction: 35 %  End diastolic volume 20570l  End systolic volume 13177l  IMPRESSION: 1. Fixed defects involving the  apex, anterior lateral wall and the inferior wall. The apex and anterior lateral wall fixed defects are suggestive for an infarct. No evidence for reversibility or ischemia.  2. Diffuse hypokinesia and minimal wall motion along the lateral wall.  3. Left ventricular ejection fraction is 35%.  4. High-risk stress test findings*.  *2012 Appropriate Use Criteria for Coronary Revascularization Focused Update: J Am Coll Cardiol. 209390;30(0):923-300http://[email or web address]?articleid=[email or web address]   Electronically Signed   By: AdMarkus Daft.D.   On: 11/24/2014 17:20   Dg Chest Port 1 View  12/15/2014   CLINICAL DATA:  Acute onset of respiratory failure. Initial encounter.  EXAM: PORTABLE CHEST - 1 VIEW  COMPARISON:  Chest radiograph performed 12/13/2014  FINDINGS: The lungs are hypoexpanded. Mild vascular crowding and vascular congestion are seen, with question of minimal interstitial edema. There is no evidence of pleural effusion or pneumothorax.  The cardiomediastinal silhouette is borderline enlarged. No acute osseous abnormalities are seen.  IMPRESSION: Lungs hypoexpanded. Vascular congestion and borderline cardiomegaly, with question of minimal interstitial edema.   Electronically Signed   By: JeGarald Balding.D.   On: 12/15/2014 00:08   Dg Chest Port 1 View  12/13/2014   CLINICAL DATA:  Pulmonary edema  EXAM: PORTABLE CHEST - 1 VIEW  COMPARISON:  December 12, 2014  FINDINGS: There is less edema compared to 1 day prior. There is currently trace interstitial edema. There is subtle alveolar opacity in each lung base and right mid lung. Suspect edema, although small foci of pneumonia could present similarly. No new areas of opacity identified. Heart is mildly enlarged with mild pulmonary venous hypertension. No adenopathy. There are small cervical ribs bilaterally, larger on the left than on the right.  IMPRESSION: Less edema compared to 1 day prior. Scattered foci of alveolar opacity may represent  either localized edema or pneumonia. Both entities may exist concurrently. No new opacity. No change in cardiac silhouette. The appearance of the pulmonary vasculature suggests a degree of volume overload.   Electronically Signed   By: WiGwyndolyn Saxon  Jasmine December M.D.   On: 12/13/2014 07:03   Dg Chest Port 1 View  12/12/2014   CLINICAL DATA:  Acute onset of shortness of breath. Assess for flash pulmonary edema.  EXAM: PORTABLE CHEST - 1 VIEW  COMPARISON:  Chest radiograph from 12/11/2014  FINDINGS: The lungs are well-aerated. Mild right-sided airspace opacities could reflect mildly asymmetric interstitial edema. There is trace fluid along the right minor fissure. No pneumothorax is identified.  The cardiomediastinal silhouette is borderline normal in size. An external pacing pad is noted overlying the left hemithorax. No acute osseous abnormalities are seen.  IMPRESSION: Mild right-sided airspace opacities could reflect mildly asymmetric interstitial edema. Trace fluid along the right minor fissure.   Electronically Signed   By: Garald Balding M.D.   On: 12/12/2014 04:55   Dg Chest Port 1 View  12/12/2014   CLINICAL DATA:  Status post cardiac arrest and resuscitation. Initial encounter.  EXAM: PORTABLE CHEST - 1 VIEW  COMPARISON:  Chest radiograph from 01/02/2014  FINDINGS: There is elevation of the right hemidiaphragm. There is no evidence of focal opacification, pleural effusion or pneumothorax.  The cardiomediastinal silhouette is borderline normal in size. No acute osseous abnormalities are seen. A small left cervical rib is incidentally noted.  IMPRESSION: 1. Elevation of the right hemidiaphragm; lungs remain grossly clear. 2. Small left cervical rib incidentally noted.   Electronically Signed   By: Garald Balding M.D.   On: 12/12/2014 00:04    CBC  Recent Labs Lab 12/14/14 0824 12/15/14 0800 12/18/14 0436 12/19/14 1225 12/20/14 0717  WBC 8.9 7.2 8.0 8.1 6.7  HGB 8.0* 9.1* 9.6* 9.5* 9.0*  HCT 25.0*  28.1* 29.8* 29.0* 26.6*  PLT 296 274 282 316 322  MCV 97.7 97.9 96.4 93.5 91.4  MCH 31.3 31.7 31.1 30.6 30.9  MCHC 32.0 32.4 32.2 32.8 33.8  RDW 14.7 14.3 14.3 13.9 13.7    Chemistries   Recent Labs Lab 12/15/14 1715 12/16/14 0331 12/18/14 0436 12/19/14 0428 12/19/14 1225 12/20/14 0730  NA  --  132* 131* 124* 123* 119*  K  --  4.2 4.4 4.9 4.3 4.7  CL  --  94* 94* 87* 88* 85*  CO2  --  28 25 24 23 22   GLUCOSE  --  137* 108* 122* 115* 110*  BUN  --  24* 40* 62* 66* 80*  CREATININE  --  3.74* 4.58* 6.17* 6.48* 7.23*  CALCIUM  --  8.6 8.5 8.6 8.7 8.2*  MG 2.1  --   --   --   --   --    ------------------------------------------------------------------------------------------------------------------ estimated creatinine clearance is 9.1 mL/min (by C-G formula based on Cr of 7.23). ------------------------------------------------------------------------------------------------------------------ No results for input(s): HGBA1C in the last 72 hours. ------------------------------------------------------------------------------------------------------------------ No results for input(s): CHOL, HDL, LDLCALC, TRIG, CHOLHDL, LDLDIRECT in the last 72 hours. ------------------------------------------------------------------------------------------------------------------ No results for input(s): TSH, T4TOTAL, T3FREE, THYROIDAB in the last 72 hours.  Invalid input(s): FREET3 ------------------------------------------------------------------------------------------------------------------ No results for input(s): VITAMINB12, FOLATE, FERRITIN, TIBC, IRON, RETICCTPCT in the last 72 hours.  Coagulation profile No results for input(s): INR, PROTIME in the last 168 hours.  No results for input(s): DDIMER in the last 72 hours.  Cardiac Enzymes No results for input(s): CKMB, TROPONINI, MYOGLOBIN in the last 168 hours.  Invalid input(s):  CK ------------------------------------------------------------------------------------------------------------------ Invalid input(s): POCBNP    Jamarkus Lisbon L . on 12/20/2014 at 1:10 PM  [email or web address] - password [facility]  [facility]

## 2014-12-20 NOTE — Progress Notes (Signed)
Bipap still onstandby if needed at this time.  Rt will continue to monitor.

## 2014-12-21 DIAGNOSIS — E785 Hyperlipidemia, unspecified: Secondary | ICD-10-CM | POA: Diagnosis not present

## 2014-12-21 DIAGNOSIS — I252 Old myocardial infarction: Secondary | ICD-10-CM | POA: Diagnosis not present

## 2014-12-21 DIAGNOSIS — D509 Iron deficiency anemia, unspecified: Secondary | ICD-10-CM | POA: Diagnosis not present

## 2014-12-21 DIAGNOSIS — M6281 Muscle weakness (generalized): Secondary | ICD-10-CM | POA: Diagnosis not present

## 2014-12-21 DIAGNOSIS — I255 Ischemic cardiomyopathy: Secondary | ICD-10-CM | POA: Diagnosis not present

## 2014-12-21 DIAGNOSIS — D649 Anemia, unspecified: Secondary | ICD-10-CM | POA: Diagnosis not present

## 2014-12-21 DIAGNOSIS — R600 Localized edema: Secondary | ICD-10-CM | POA: Diagnosis not present

## 2014-12-21 DIAGNOSIS — R079 Chest pain, unspecified: Secondary | ICD-10-CM | POA: Diagnosis not present

## 2014-12-21 DIAGNOSIS — I469 Cardiac arrest, cause unspecified: Secondary | ICD-10-CM | POA: Diagnosis not present

## 2014-12-21 DIAGNOSIS — E1322 Other specified diabetes mellitus with diabetic chronic kidney disease: Secondary | ICD-10-CM | POA: Diagnosis not present

## 2014-12-21 DIAGNOSIS — R531 Weakness: Secondary | ICD-10-CM | POA: Diagnosis not present

## 2014-12-21 DIAGNOSIS — F99 Mental disorder, not otherwise specified: Secondary | ICD-10-CM | POA: Diagnosis not present

## 2014-12-21 DIAGNOSIS — N186 End stage renal disease: Secondary | ICD-10-CM | POA: Diagnosis not present

## 2014-12-21 DIAGNOSIS — H913 Deaf nonspeaking, not elsewhere classified: Secondary | ICD-10-CM | POA: Diagnosis not present

## 2014-12-21 DIAGNOSIS — F419 Anxiety disorder, unspecified: Secondary | ICD-10-CM | POA: Diagnosis not present

## 2014-12-21 DIAGNOSIS — D631 Anemia in chronic kidney disease: Secondary | ICD-10-CM | POA: Diagnosis not present

## 2014-12-21 DIAGNOSIS — E1129 Type 2 diabetes mellitus with other diabetic kidney complication: Secondary | ICD-10-CM | POA: Diagnosis not present

## 2014-12-21 DIAGNOSIS — I472 Ventricular tachycardia: Secondary | ICD-10-CM | POA: Diagnosis not present

## 2014-12-21 DIAGNOSIS — M19042 Primary osteoarthritis, left hand: Secondary | ICD-10-CM | POA: Diagnosis not present

## 2014-12-21 DIAGNOSIS — F29 Unspecified psychosis not due to a substance or known physiological condition: Secondary | ICD-10-CM | POA: Diagnosis not present

## 2014-12-21 DIAGNOSIS — Z79899 Other long term (current) drug therapy: Secondary | ICD-10-CM | POA: Diagnosis not present

## 2014-12-21 DIAGNOSIS — I10 Essential (primary) hypertension: Secondary | ICD-10-CM | POA: Diagnosis not present

## 2014-12-21 DIAGNOSIS — I5032 Chronic diastolic (congestive) heart failure: Secondary | ICD-10-CM | POA: Diagnosis not present

## 2014-12-21 DIAGNOSIS — L03031 Cellulitis of right toe: Secondary | ICD-10-CM | POA: Diagnosis not present

## 2014-12-21 DIAGNOSIS — R000 Tachycardia, unspecified: Secondary | ICD-10-CM | POA: Diagnosis not present

## 2014-12-21 DIAGNOSIS — R404 Transient alteration of awareness: Secondary | ICD-10-CM | POA: Diagnosis not present

## 2014-12-21 DIAGNOSIS — F4320 Adjustment disorder, unspecified: Secondary | ICD-10-CM | POA: Diagnosis not present

## 2014-12-21 DIAGNOSIS — I120 Hypertensive chronic kidney disease with stage 5 chronic kidney disease or end stage renal disease: Secondary | ICD-10-CM | POA: Diagnosis not present

## 2014-12-21 DIAGNOSIS — I509 Heart failure, unspecified: Secondary | ICD-10-CM | POA: Diagnosis not present

## 2014-12-21 DIAGNOSIS — Z7982 Long term (current) use of aspirin: Secondary | ICD-10-CM | POA: Diagnosis not present

## 2014-12-21 DIAGNOSIS — I2510 Atherosclerotic heart disease of native coronary artery without angina pectoris: Secondary | ICD-10-CM | POA: Diagnosis not present

## 2014-12-21 DIAGNOSIS — N189 Chronic kidney disease, unspecified: Secondary | ICD-10-CM | POA: Diagnosis not present

## 2014-12-21 DIAGNOSIS — J209 Acute bronchitis, unspecified: Secondary | ICD-10-CM | POA: Diagnosis not present

## 2014-12-21 DIAGNOSIS — E119 Type 2 diabetes mellitus without complications: Secondary | ICD-10-CM | POA: Diagnosis not present

## 2014-12-21 DIAGNOSIS — R4182 Altered mental status, unspecified: Secondary | ICD-10-CM | POA: Diagnosis not present

## 2014-12-21 DIAGNOSIS — H540 Blindness, both eyes: Secondary | ICD-10-CM | POA: Diagnosis not present

## 2014-12-21 DIAGNOSIS — Z7902 Long term (current) use of antithrombotics/antiplatelets: Secondary | ICD-10-CM | POA: Diagnosis not present

## 2014-12-21 DIAGNOSIS — Z992 Dependence on renal dialysis: Secondary | ICD-10-CM | POA: Diagnosis not present

## 2014-12-21 DIAGNOSIS — D259 Leiomyoma of uterus, unspecified: Secondary | ICD-10-CM | POA: Diagnosis not present

## 2014-12-21 DIAGNOSIS — N2581 Secondary hyperparathyroidism of renal origin: Secondary | ICD-10-CM | POA: Diagnosis not present

## 2014-12-21 DIAGNOSIS — R627 Adult failure to thrive: Secondary | ICD-10-CM | POA: Diagnosis not present

## 2014-12-21 LAB — RENAL FUNCTION PANEL
Albumin: 2.7 g/dL — ABNORMAL LOW (ref 3.5–5.2)
Albumin: 2.6 g/dL — ABNORMAL LOW (ref 3.5–5.2)
Anion gap: 12 (ref 5–15)
Anion gap: 10 (ref 5–15)
BUN: 80 mg/dL — ABNORMAL HIGH (ref 6–23)
BUN: 49 mg/dL — ABNORMAL HIGH (ref 6–23)
CO2: 22 mmol/L (ref 19–32)
CO2: 26 mmol/L (ref 19–32)
Calcium: 8.2 mg/dL — ABNORMAL LOW (ref 8.4–10.5)
Calcium: 8.2 mg/dL — ABNORMAL LOW (ref 8.4–10.5)
Chloride: 85 mEq/L — ABNORMAL LOW (ref 96–112)
Chloride: 93 mEq/L — ABNORMAL LOW (ref 96–112)
Creatinine, Ser: 7.23 mg/dL — ABNORMAL HIGH (ref 0.50–1.10)
Creatinine, Ser: 4.89 mg/dL — ABNORMAL HIGH (ref 0.50–1.10)
GFR calc Af Amer: 6 mL/min — ABNORMAL LOW (ref >90)
GFR calc Af Amer: 10 mL/min — ABNORMAL LOW (ref >90)
GFR calc non Af Amer: 6 mL/min — ABNORMAL LOW (ref >90)
GFR calc non Af Amer: 9 mL/min — ABNORMAL LOW (ref >90)
Glucose, Bld: 110 mg/dL — ABNORMAL HIGH (ref 70–99)
Glucose, Bld: 110 mg/dL — ABNORMAL HIGH (ref 70–99)
Phosphorus: 6.5 mg/dL — ABNORMAL HIGH (ref 2.3–4.6)
Phosphorus: 4.9 mg/dL — ABNORMAL HIGH (ref 2.3–4.6)
Potassium: 4.7 mmol/L (ref 3.5–5.1)
Potassium: 4.4 mmol/L (ref 3.5–5.1)
Sodium: 119 mmol/L — CL (ref 135–145)
Sodium: 129 mmol/L — ABNORMAL LOW (ref 135–145)

## 2014-12-21 LAB — CBC
HCT: 26.9 % — ABNORMAL LOW (ref 36.0–46.0)
Hemoglobin: 8.8 g/dL — ABNORMAL LOW (ref 12.0–15.0)
MCH: 30.6 pg (ref 26.0–34.0)
MCHC: 32.7 g/dL (ref 30.0–36.0)
MCV: 93.4 fL (ref 78.0–100.0)
Platelets: 341 10*3/uL (ref 150–400)
RBC: 2.88 MIL/uL — ABNORMAL LOW (ref 3.87–5.11)
RDW: 13.8 % (ref 11.5–15.5)
WBC: 5.9 10*3/uL (ref 4.0–10.5)

## 2014-12-21 LAB — IRON AND TIBC
Iron: 74 ug/dL (ref 42–135)
Saturation Ratios: 34 % (ref 20–55)
TIBC: 216 ug/dL — ABNORMAL LOW (ref 250–470)
UIBC: 142 ug/dL (ref 125–400)

## 2014-12-21 LAB — GLUCOSE, CAPILLARY
Glucose-Capillary: 103 mg/dL — ABNORMAL HIGH (ref 70–99)
Glucose-Capillary: 91 mg/dL (ref 70–99)

## 2014-12-21 LAB — HEPATITIS B SURFACE ANTIGEN: Hepatitis B Surface Ag: NEGATIVE

## 2014-12-21 MED ORDER — DARBEPOETIN ALFA 60 MCG/0.3ML IJ SOSY
PREFILLED_SYRINGE | INTRAMUSCULAR | Status: AC
  Administered 2014-12-21: 60 ug
  Filled 2014-12-21: qty 0.3

## 2014-12-21 MED ORDER — PRO-STAT SUGAR FREE PO LIQD: 30.0000 mL | Freq: Three times a day (TID) | ORAL | Status: DC

## 2014-12-21 MED ORDER — AMIODARONE HCL 400 MG PO TABS: 400.0000 mg | ORAL_TABLET | Freq: Every day | ORAL | Status: DC

## 2014-12-21 NOTE — Clinical Social Work Note (Signed)
Patient will discharge to [facility] Anticipated discharge date: 12/21/14 Family notified: Percell Miller- patients son Transportation by [employer]- called at 1:30  [place] signing off.  Domenica Reamer, [place] Social Worker [phone]

## 2014-12-21 NOTE — Procedures (Signed)
I was present at this dialysis session, have reviewed the session itself and made  appropriate changes  Kelly Splinter MD (pgr) [phone]    [phone] 12/21/2014, 8:27 AM

## 2014-12-21 NOTE — Progress Notes (Signed)
Patient ID: [name], female   DOB: 05/03/1956, 58 y.o.   MRN: [id number]  [facility] Progress Note   Assessment/ Plan:   1. S/P Cardiac arrest @ SNF (likely a sustained V Tach episode) received CPR for ~6 mins. Resuscitated successfully but not a candidate for ICD placement. Son has elected for FULL CODE and aggressive measures after meeting with palliative care. 2. ESRD - HD MWF. HD today 3. HTN/Volume - well controlled on acei / MTP and UF on HD 4. Anemia - Hgb 9.0, continue ESA and IV Fe 5. 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 6. CAD/ischemic CM - EF 30-35% per echo 12/20. 7. DM Type 2 - per primary 8. Psych - IM risperidone every 2 wks 9. Dispo - possible dc to SNF today  Kelly Splinter MD (pgr) [phone]    [phone] 12/21/2014, 8:31 AM   Subjective:   No interpreter available, no distresss   Objective:   BP 149/73 mmHg  Pulse 77  Temp(Src) 99.3 F (37.4 C) (Oral)  Resp 14  Ht 5\' 6"  (1.676 m)  Wt 83.5 kg (184 lb 1.4 oz)  BMI 29.73 kg/m2  SpO2 98%  Physical Exam: [id number] on HD [id number] RRR, normal s1 and s2 Resp:CTA bilaterally, no rales [id number], obese, NT, BS normal Ext:No LE edema noted  HD: MWF [facility] 4h 47min 400/800 79kg 2/2.5 [place] [facility] AVF Heparin 9000 Aranesp 40/wk, Venofer 100/wk  Hb 9.1, pth 202 Yesterday post HD was 79.3kg  Labs: BMET  Recent Labs Lab 12/15/14 0700 12/16/14 0331 12/18/14 0436 12/19/14 0428 12/19/14 1225 12/20/14 0730 12/21/14 0633  NA 129* 132* 131* 124* 123* 119* 129*  K 4.5 4.2 4.4 4.9 4.3 4.7 4.4  CL 91* 94* 94* 87* 88* 85* 93*  CO2 24 28 25 24 23 22 26   GLUCOSE 118* 137* 108* 122* 115* 110* 110*  BUN 32* 24* 40* 62* 66* 80* 49*  CREATININE 4.19* 3.74* 4.58* 6.17* 6.48* 7.23* 4.89*  CALCIUM 8.6 8.6 8.5 8.6 8.7 8.2* 8.2*  PHOS  --   --   --   --  5.7* 6.5* 4.9*   CBC  Recent Labs Lab 12/18/14 0436 12/19/14 1225 12/20/14 0717 12/21/14 0633  WBC 8.0 8.1 6.7 5.9  HGB  9.6* 9.5* 9.0* 8.8*  HCT 29.8* 29.0* 26.6* 26.9*  MCV 96.4 93.5 91.4 93.4  PLT 282 316 322 341   Medications:    . amiodarone  400 mg Oral Daily  . antiseptic oral rinse  7 mL Mouth Rinse q12n4p  . aspirin  81 mg Oral Daily  . atorvastatin  10 mg Oral Daily  . calcium acetate  667 mg Oral TID WC  . clopidogrel  75 mg Oral Daily  . darbepoetin (ARANESP) injection - DIALYSIS  60 mcg Intravenous Q Mon-HD  . feeding supplement (PRO-STAT SUGAR FREE 64)  30 mL Oral TID WC  . ferric gluconate (FERRLECIT/NULECIT) IV  125 mg Intravenous Q Mon-HD  . heparin  5,000 Units Subcutaneous 3 times per day  . insulin aspart  0-15 Units Subcutaneous Q6H  . lisinopril  2.5 mg Oral Daily  . metoprolol succinate  50 mg Oral Daily  . risperiDONE microspheres  25 mg Intramuscular Q14 Days  . sodium chloride  3 mL Intravenous Q12H

## 2014-12-21 NOTE — Discharge Summary (Signed)
Physician Discharge Summary  [name] BJY:[id number] DOB: [birth year] DOA: 12/11/2014  PCP: No primary care provider on file.  Admit date: 12/11/2014 Discharge date: 12/21/2014  Time spent: greater than 30 minutes  Recommendations for Outpatient Follow-up:  1. Back to SNF 2. Continue goals of care discussion  Discharge Diagnoses:  Primary problem   Cardiac arrest Active Problems:   HLD (hyperlipidemia)   Deaf mutism, congenital   Essential hypertension   Acute respiratory failure with hypoxia   Diabetes mellitus with ESRD (end-stage renal disease)   Cardiomyopathy, ischemic-EF 30-35%   Sustained VT (ventricular tachycardia)   CKD (chronic kidney disease) stage V requiring chronic dialysis   Anemia in CKD (chronic kidney disease)   End stage renal disease on dialysis   Flash pulmonary edema   Pulmonary hypertension   Moderate tricuspid regurgitation   Diabetes type 2, diet controlled   Anemia of chronic kidney disease blind   Discharge Condition: stable  Code status: full code  Diet recommendation: carb modified renal  Filed Weights   12/20/14 2039 12/21/14 0622 12/21/14 1022  Weight: 81.251 kg (179 lb 2 oz) 83.5 kg (184 lb 1.4 oz) 80.2 kg (176 lb 12.9 oz)    History of present illness/hospital course 58 year old female patient w/ known chronic kidney disease on dialysis, hypertension, recent admission for anterior wall MI medically managed, anemia which was fecal occult blood negative 2, and deaf and blind since childhood who was sent over from the nursing home after being found unconscious after her dialysis treatment on Friday. Nursing home personnel examined the patient and found her to be pulseless and CPR was initiated. An AED was applied and found a non-shockable rhythm. Patient had return of spontaneous circulation/pulse after ~ 6 minutes of chest compression and administration of intraosseous lidocaine. Before these events no apparent acute event or changes  were reported by the nursing home staff.  Upon arrival to the emergency department she was alert and responsive. evaluated in the emergency department by cardiology. After arrival she developed ventricular tachycardia as well as acute dyspnea and hypertensive urgency. She was placed on BiPAP, given IV Lasix, IV nitroglycerin and IV amiodarone. It was suspected patient had a ventricular tach arrest prior to arrival. She stabilized and was able to be admitted to stepdown unit.  Shortly after admission to the nursing unit patient once again decompensated from a respiratory standpoint so critical care medicine was consulted and the patient was transferred to the ICU. Patient's respiratory symptoms responded to dialysis. Cardiology has documented that she is not a candidate for an AICD and recommended discussion with the family about DO NOT RESUSCITATE status since she is likely to have recurrent V. tach in the future. Cardiology has documented that up titration of cardiac meds has been difficult due to ongoing hypotension/soft blood pressures. Dialysis has been accomplished without significant hypotension.  Palliative care met with patient's son. At this time patient's son is not ready to make patient DO NOT RESUSCITATE or comfort care. He understands that his mother has guarded prognosis .   Cardiac arrest/sustained V. tach/ischemic cardiomyopathy -EF of 30-35% -Not a candidate for ICD -Continue amiodarone per cardiology  ESRD requiring dialysis Dialysis per nephrology  Essential hypertension Several medications d/c'd and patient remains normotensive  Acute respiratory failure with hypoxia due to flash pulmonary edema -Status post arrest -Treated with dialysis Now on room air  Pulmonary hypertension with moderate regurgitation  Diabetes mellitus Essentially diet controlled. Not on medications as outpatient  Congenital mutism and  deafness/blindness -Communicates through sign language using  the palm of her hand with family  Anemia of chronic disease Received aranesp and iron therapy  Abnormal urinalysis consistent with UTI -Urine culture negative, patient received 3 days of Rocephin  Hyponatremia improved with dialysis  Procedures  BiPAP hemodialysis  Consults  Cardiology Nephrology Palliative care PCCM  Discharge Exam: Filed Vitals:   12/21/14 1022  BP: 130/61  Pulse: 75  Temp: 98.1 F (36.7 C)  Resp: 13   Tele: nsr  General: nonverbal. Appears comfortable Cardiovascular: RRR without MGR Respiratory: CTA without WRR abd s, nt, nd Ext no CCE  Discharge Instructions   Discharge Instructions    Diet - carb modified renal Complete by:  As directed      Walk with assistance    Complete by:  As directed           Current Discharge Medication List    START taking these medications   Details  Amino Acids-Protein Hydrolys (FEEDING SUPPLEMENT, PRO-STAT SUGAR FREE 64,) LIQD Take 30 mLs by mouth 3 (three) times daily with meals. Qty: 900 mL, Refills: 0    amiodarone (PACERONE) 400 MG tablet Take 1 tablet (400 mg total) by mouth daily.      CONTINUE these medications which have NOT CHANGED   Details  aspirin 81 MG tablet Take 81 mg by mouth daily. For hypertension    atorvastatin (LIPITOR) 10 MG tablet Take 10 mg by mouth daily.    bisacodyl (DULCOLAX) 10 MG suppository Place 1 suppository (10 mg total) rectally daily as needed for mild constipation, moderate constipation or severe constipation. Qty: 12 suppository, Refills: 0    calcium acetate (PHOSLO) 667 MG capsule Take 1 capsule (667 mg total) by mouth 3 (three) times daily with meals. Qty: 30 capsule, Refills: 1    clopidogrel (PLAVIX) 75 MG tablet Take 1 tablet (75 mg total) by mouth daily.    Docusate Sodium (DSS) 100 MG CAPS Take 100 mg by mouth 2 (two) times daily. For constipation    lactulose (CHRONULAC) 10 GM/15ML solution Take 30 mLs (20 g total) by mouth 2 (two) times daily  as needed for moderate constipation. Qty: 240 mL, Refills: 0    levalbuterol (XOPENEX) 0.63 MG/3ML nebulizer solution Take 3 mLs (0.63 mg total) by nebulization every 2 (two) hours as needed for wheezing or shortness of breath. Qty: 3 mL, Refills: 12    lisinopril (PRINIVIL,ZESTRIL) 2.5 MG tablet Take 1 tablet (2.5 mg total) by mouth daily.    metoprolol succinate (TOPROL-XL) 50 MG 24 hr tablet Take 1 tablet (50 mg total) by mouth daily. Take with or immediately following a meal.    multivitamin (RENA-VIT) TABS tablet Take 1 tablet by mouth at bedtime. Qty: 30 tablet, Refills: 0    nitroGLYCERIN (NITROSTAT) 0.4 MG SL tablet Place 1 tablet (0.4 mg total) under the tongue every 5 (five) minutes as needed for chest pain. Refills: 12    promethazine (PHENERGAN) 12.5 MG suppository Place 12.5 mg rectally every 4 (four) hours as needed for nausea or vomiting.      STOP taking these medications     cloNIDine (CATAPRES - DOSED IN MG/24 HR) 0.1 mg/24hr patch      hydrALAZINE (APRESOLINE) 100 MG tablet      isosorbide mononitrate (IMDUR) 60 MG 24 hr tablet      Multiple Vitamin (MULTIVITAMIN WITH MINERALS) TABS tablet      naphazoline-pheniramine (NAPHCON-A) 0.025-0.3 % ophthalmic solution  risperiDONE microspheres (RISPERDAL CONSTA) 25 MG injection      darbepoetin (ARANESP) 100 MCG/0.5ML SOLN injection        No Known Allergies    The results of significant diagnostics from this hospitalization (including imaging, microbiology, ancillary and laboratory) are listed below for reference.    Significant Diagnostic Studies: Ct Head Wo Contrast  12/12/2014   CLINICAL DATA:  Acute onset of altered mental status. Status post last fall with asystole, status post CPR and resuscitation. Initial encounter. Concern for head or cervical spine injury.  EXAM: CT HEAD WITHOUT CONTRAST  CT CERVICAL SPINE WITHOUT CONTRAST  TECHNIQUE: Multidetector CT imaging of the head and cervical spine was  performed following the standard protocol without intravenous contrast. Multiplanar CT image reconstructions of the cervical spine were also generated.  COMPARISON:  None.  FINDINGS: CT HEAD FINDINGS  There is no evidence of acute infarction, mass lesion, or intra- or extra-axial hemorrhage on CT.  Prominence of the ventricles and sulci reflects mild cortical volume loss. Scattered periventricular and subcortical white matter change likely reflects small vessel ischemic microangiopathy.  The brainstem and fourth ventricle are within normal limits. The basal ganglia are unremarkable in appearance. The cerebral hemispheres demonstrate grossly normal gray-white differentiation. No mass effect or midline shift is seen.  There is no evidence of fracture; visualized osseous structures are unremarkable in appearance. The visualized portions of the orbits are within normal limits. The paranasal sinuses and mastoid air cells are well-aerated. No significant soft tissue abnormalities are seen.  CT CERVICAL SPINE FINDINGS  There is no evidence of fracture or subluxation. Vertebral bodies demonstrate normal height and alignment. There is mild narrowing of the intervertebral disc spaces at C3-C4 and C4-C5, with associated anterior and posterior disc osteophyte complexes. Prevertebral soft tissues are within normal limits.  A 0.9 cm hypodensity is noted at the left thyroid lobe. There is mild interstitial prominence at the lung apices. No significant soft tissue abnormalities are seen.  IMPRESSION: 1. No evidence of traumatic intracranial injury or fracture. 2. No evidence of fracture or subluxation along the cervical spine. 3. Mild cortical volume loss and scattered small vessel ischemic microangiopathy. 4. Minimal degenerative change at the upper cervical spine. 5. Sub-centimeter thyroid nodule(s) noted, too small to characterize, but most likely benign in the absence of known clinical risk factors for thyroid carcinoma. 6.  Mild interstitial prominence at the lung apices.   Electronically Signed   By: Garald Balding M.D.   On: 12/12/2014 00:02   Ct Angio Chest Pe W/cm &/or Wo Cm  12/12/2014   CLINICAL DATA:  Acute onset of chest pain and shortness of breath. Patient was unresponsive, with asystole, status post cardiac resuscitation. Initial encounter.  EXAM: CT ANGIOGRAPHY CHEST WITH CONTRAST  TECHNIQUE: Multidetector CT imaging of the chest was performed using the standard protocol during bolus administration of intravenous contrast. Multiplanar CT image reconstructions and MIPs were obtained to evaluate the vascular anatomy.  CONTRAST:  57m OMNIPAQUE IOHEXOL 350 MG/ML SOLN  COMPARISON:  Chest radiograph performed 12/11/2014  FINDINGS: There is no evidence of pulmonary embolus.  Mild peripheral nodular densities are thought to reflect atelectasis. There is slight interstitial prominence, without definite pulmonary edema. There is no evidence of significant focal consolidation, pleural effusion or pneumothorax. No suspicious masses are identified; no abnormal focal contrast enhancement is seen.  There is mild diffuse peribronchial thickening, particularly at the hila. No definite mediastinal lymphadenopathy is seen. No pericardial effusion is identified. The great vessels are  grossly unremarkable in appearance. No axillary lymphadenopathy is seen. Multiple hypodensities are seen within the thyroid gland, measuring up to 1.4 cm in size, with minimal calcification.  The visualized portions of the liver and spleen are unremarkable.  No acute osseous abnormalities are seen.  Review of the MIP images confirms the above findings.  IMPRESSION: 1. No evidence of pulmonary embolus. 2. Slight interstitial prominence noted, without definite pulmonary edema. Mild peripheral nodular densities are thought to reflect atelectasis. Lungs otherwise grossly clear. 3. Mild diffuse peribronchial thickening noted, nonspecific in appearance. 4. Multiple  hypodensities within the thyroid gland measuring up to 1.4 cm in size. Consider further evaluation with thyroid ultrasound. If patient is clinically hyperthyroid, consider nuclear medicine thyroid uptake and scan.   Electronically Signed   By: Garald Balding M.D.   On: 12/12/2014 01:51   Ct Cervical Spine Wo Contrast  12/12/2014   CLINICAL DATA:  Acute onset of altered mental status. Status post last fall with asystole, status post CPR and resuscitation. Initial encounter. Concern for head or cervical spine injury.  EXAM: CT HEAD WITHOUT CONTRAST  CT CERVICAL SPINE WITHOUT CONTRAST  TECHNIQUE: Multidetector CT imaging of the head and cervical spine was performed following the standard protocol without intravenous contrast. Multiplanar CT image reconstructions of the cervical spine were also generated.  COMPARISON:  None.  FINDINGS: CT HEAD FINDINGS  There is no evidence of acute infarction, mass lesion, or intra- or extra-axial hemorrhage on CT.  Prominence of the ventricles and sulci reflects mild cortical volume loss. Scattered periventricular and subcortical white matter change likely reflects small vessel ischemic microangiopathy.  The brainstem and fourth ventricle are within normal limits. The basal ganglia are unremarkable in appearance. The cerebral hemispheres demonstrate grossly normal gray-white differentiation. No mass effect or midline shift is seen.  There is no evidence of fracture; visualized osseous structures are unremarkable in appearance. The visualized portions of the orbits are within normal limits. The paranasal sinuses and mastoid air cells are well-aerated. No significant soft tissue abnormalities are seen.  CT CERVICAL SPINE FINDINGS  There is no evidence of fracture or subluxation. Vertebral bodies demonstrate normal height and alignment. There is mild narrowing of the intervertebral disc spaces at C3-C4 and C4-C5, with associated anterior and posterior disc osteophyte complexes.  Prevertebral soft tissues are within normal limits.  A 0.9 cm hypodensity is noted at the left thyroid lobe. There is mild interstitial prominence at the lung apices. No significant soft tissue abnormalities are seen.  IMPRESSION: 1. No evidence of traumatic intracranial injury or fracture. 2. No evidence of fracture or subluxation along the cervical spine. 3. Mild cortical volume loss and scattered small vessel ischemic microangiopathy. 4. Minimal degenerative change at the upper cervical spine. 5. Sub-centimeter thyroid nodule(s) noted, too small to characterize, but most likely benign in the absence of known clinical risk factors for thyroid carcinoma. 6. Mild interstitial prominence at the lung apices.   Electronically Signed   By: Garald Balding M.D.   On: 12/12/2014 00:02   Nm Myocar Multi W/spect W/wall Motion / Ef  11/24/2014   CLINICAL DATA:  58 year old with suspected anterior wall myocardial infarction. Medical history is significant for end-stage renal disease and diabetes.  EXAM: MYOCARDIAL IMAGING WITH SPECT (REST AND PHARMACOLOGIC-STRESS)  GATED LEFT VENTRICULAR WALL MOTION STUDY  LEFT VENTRICULAR EJECTION FRACTION  TECHNIQUE: Standard myocardial SPECT imaging was performed after resting intravenous injection of 10 mCi Tc-42msestamibi. Subsequently, intravenous infusion of Lexiscan was performed under the supervision  of the Cardiology staff. At peak effect of the drug, 30 mCi Tc-53msestamibi was injected intravenously and standard myocardial SPECT imaging was performed. Quantitative gated imaging was also performed to evaluate left ventricular wall motion, and estimate left ventricular ejection fraction.  COMPARISON:  Chest radiograph 11/20/2014  FINDINGS: Perfusion: Fixed defect involving the apex. There is a fixed defect involving the anterior lateral wall at the apex and mid segment. Fixed defect along the inferior wall in the mid and basilar segments. No evidence for reversibility.  Wall  Motion: Diffuse hypokinesia. Minimal wall motion in the lateral wall.  Left Ventricular Ejection Fraction: 35 %  End diastolic volume 2003ml  End systolic volume 1704ml  IMPRESSION: 1. Fixed defects involving the apex, anterior lateral wall and the inferior wall. The apex and anterior lateral wall fixed defects are suggestive for an infarct. No evidence for reversibility or ischemia.  2. Diffuse hypokinesia and minimal wall motion along the lateral wall.  3. Left ventricular ejection fraction is 35%.  4. High-risk stress test findings*.  *2012 Appropriate Use Criteria for Coronary Revascularization Focused Update: J Am Coll Cardiol. 28889;16(9):450-388 http://content.oairportbarriers.comaspx?articleid=[email or web address]   Electronically Signed   By: AMarkus DaftM.D.   On: 11/24/2014 17:20   Dg Chest Port 1 View  12/15/2014   CLINICAL DATA:  Acute onset of respiratory failure. Initial encounter.  EXAM: PORTABLE CHEST - 1 VIEW  COMPARISON:  Chest radiograph performed 12/13/2014  FINDINGS: The lungs are hypoexpanded. Mild vascular crowding and vascular congestion are seen, with question of minimal interstitial edema. There is no evidence of pleural effusion or pneumothorax.  The cardiomediastinal silhouette is borderline enlarged. No acute osseous abnormalities are seen.  IMPRESSION: Lungs hypoexpanded. Vascular congestion and borderline cardiomegaly, with question of minimal interstitial edema.   Electronically Signed   By: JGarald BaldingM.D.   On: 12/15/2014 00:08   Dg Chest Port 1 View  12/13/2014   CLINICAL DATA:  Pulmonary edema  EXAM: PORTABLE CHEST - 1 VIEW  COMPARISON:  December 12, 2014  FINDINGS: There is less edema compared to 1 day prior. There is currently trace interstitial edema. There is subtle alveolar opacity in each lung base and right mid lung. Suspect edema, although small foci of pneumonia could present similarly. No new areas of opacity identified. Heart is mildly enlarged with mild pulmonary  venous hypertension. No adenopathy. There are small cervical ribs bilaterally, larger on the left than on the right.  IMPRESSION: Less edema compared to 1 day prior. Scattered foci of alveolar opacity may represent either localized edema or pneumonia. Both entities may exist concurrently. No new opacity. No change in cardiac silhouette. The appearance of the pulmonary vasculature suggests a degree of volume overload.   Electronically Signed   By: WLowella GripM.D.   On: 12/13/2014 07:03   Dg Chest Port 1 View  12/12/2014   CLINICAL DATA:  Acute onset of shortness of breath. Assess for flash pulmonary edema.  EXAM: PORTABLE CHEST - 1 VIEW  COMPARISON:  Chest radiograph from 12/11/2014  FINDINGS: The lungs are well-aerated. Mild right-sided airspace opacities could reflect mildly asymmetric interstitial edema. There is trace fluid along the right minor fissure. No pneumothorax is identified.  The cardiomediastinal silhouette is borderline normal in size. An external pacing pad is noted overlying the left hemithorax. No acute osseous abnormalities are seen.  IMPRESSION: Mild right-sided airspace opacities could reflect mildly asymmetric interstitial edema. Trace fluid along the right minor fissure.   Electronically Signed  By: Garald Balding M.D.   On: 12/12/2014 04:55   Dg Chest Port 1 View  12/12/2014   CLINICAL DATA:  Status post cardiac arrest and resuscitation. Initial encounter.  EXAM: PORTABLE CHEST - 1 VIEW  COMPARISON:  Chest radiograph from 01/02/2014  FINDINGS: There is elevation of the right hemidiaphragm. There is no evidence of focal opacification, pleural effusion or pneumothorax.  The cardiomediastinal silhouette is borderline normal in size. No acute osseous abnormalities are seen. A small left cervical rib is incidentally noted.  IMPRESSION: 1. Elevation of the right hemidiaphragm; lungs remain grossly clear. 2. Small left cervical rib incidentally noted.   Electronically Signed   By:  Garald Balding M.D.   On: 12/12/2014 00:04   Echo Left ventricle: The cavity size was normal. Wall thickness was increased in a pattern of mild LVH. Systolic function was moderately to severely reduced. The estimated ejection fraction was in the range of 30% to 35%. There is severe hypokinesis of the mid-apicalanteroseptal and apical myocardium. Doppler parameters are consistent with high ventricular filling pressure. - Mitral valve: Calcified annulus. There was mild regurgitation. - Left atrium: The atrium was moderately to severely dilated. - Tricuspid valve: There was moderate regurgitation. - Pulmonary arteries: PA peak pressure: 51 mm Hg (S).  ekg Normal sinus rhythm Inferior infarct, old Anterior infarct, old ST elevation in V2-V4  Microbiology: Recent Results (from the past 240 hour(s))  Culture, blood (routine x 2)     Status: None   Collection Time: 12/11/14 11:38 PM  Result Value Ref Range Status   Specimen Description BLOOD RIGHT ARM  Final   Special Requests BOTTLES DRAWN AEROBIC AND ANAEROBIC 5CC EACH  Final   Culture  Setup Time   Final    12/12/2014 02:51 Performed at [facility]    Culture   Final    NO GROWTH 5 DAYS Performed at [facility]    Report Status 12/18/2014 FINAL  Final  Culture, blood (routine x 2)     Status: None   Collection Time: 12/11/14 11:40 PM  Result Value Ref Range Status   Specimen Description BLOOD RIGHT HAND  Final   Special Requests BOTTLES DRAWN AEROBIC AND ANAEROBIC 5CC EACH  Final   Culture  Setup Time   Final    12/12/2014 02:52 Performed at [place]   Final    NO GROWTH 5 DAYS Performed at [facility]    Report Status 12/18/2014 FINAL  Final  Urine culture     Status: None   Collection Time: 12/12/14 12:49 AM  Result Value Ref Range Status   Specimen Description URINE, RANDOM  Final   Special Requests Normal  Final   Culture  Setup Time   Final     12/12/2014 06:28 Performed at [place] Performed at [facility]   Final   Culture NO GROWTH Performed at [facility]   Final   Report Status 12/13/2014 FINAL  Final     Labs: Basic Metabolic Panel:  Recent Labs Lab 12/15/14 1715  12/18/14 0436 12/19/14 0428 12/19/14 1225 12/20/14 0730 12/21/14 0633  NA  --   < > 131* 124* 123* 119* 129*  K  --   < > 4.4 4.9 4.3 4.7 4.4  CL  --   < > 94* 87* 88* 85* 93*  CO2  --   < > _0 22  26  GLUCOSE  --   < > 108* 122* 115* 110* 110*  BUN  --   < > 40* 62* 66* 80* 49*  CREATININE  --   < > 4.58* 6.17* 6.48* 7.23* 4.89*  CALCIUM  --   < > 8.5 8.6 8.7 8.2* 8.2*  MG 2.1  --   --   --   --   --   --   PHOS  --   --   --   --  5.7* 6.5* 4.9*  < > = values in this interval not displayed. Liver Function Tests:  Recent Labs Lab 12/19/14 1225 12/20/14 0730 12/21/14 0633  ALBUMIN 2.8* 2.7* 2.6*   No results for input(s): LIPASE, AMYLASE in the last 168 hours. No results for input(s): AMMONIA in the last 168 hours. CBC:  Recent Labs Lab 12/15/14 0800 12/18/14 0436 12/19/14 1225 12/20/14 0717 12/21/14 0633  WBC 7.2 8.0 8.1 6.7 5.9  HGB 9.1* 9.6* 9.5* 9.0* 8.8*  HCT 28.1* 29.8* 29.0* 26.6* 26.9*  MCV 97.9 96.4 93.5 91.4 93.4  PLT 274 282 316 322 341   Cardiac Enzymes: No results for input(s): CKTOTAL, CKMB, CKMBINDEX, TROPONINI in the last 168 hours. BNP: BNP (last 3 results)  Recent Labs  11/19/14 2335 12/12/14 0314  PROBNP 19448.0* 24864.0*   CBG:  Recent Labs Lab 12/19/14 0607 12/19/14 1406 12/19/14 1800 12/19/14 2358 12/20/14 0552  GLUCAP 121* 140* 109* 115* 121*       Signed:  Hill Mackie L  [facility] 12/21/2014, 11:06 AM

## 2014-12-21 NOTE — Progress Notes (Signed)
Patient Discharge:  Disposition: Pt discharged to SNF  Education: Report called to charge nurse at [facility]  IV: Removed  Telemetry: Removed. CCMD notified  Transportation: Ambulance  Belongings:All belongings taken with pt

## 2014-12-21 NOTE — Care Management Note (Signed)
CARE MANAGEMENT NOTE 12/21/2014  Patient:  [name], [name]   Account Number:  [id number]  Date Initiated:  12/14/2014  Documentation initiated by:  Elissa Hefty  Subjective/Objective Assessment:   adm w cardiac arrest     Action/Plan:   [facility]  12/21/2014 Pt for d/c back to SNF today. IM given.   Anticipated DC Date:  12/21/2014   Anticipated DC Plan:  SKILLED NURSING FACILITY  In-house referral  Clinical Social Worker         Choice offered to / List presented to:             Status of service:  Completed, signed off Medicare Important Message given?  YES (If response is "NO", the following Medicare IM given date fields will be blank) Date Medicare IM given:  12/14/2014 Medicare IM given by:  Elissa Hefty Date Additional Medicare IM given:  12/16/2014 Additional Medicare IM given by:  Elissa Hefty  Discharge Disposition:  [facility]  Per UR Regulation:  Reviewed for med. necessity/level of care/duration of stay  If discussed at [place] of Stay Meetings, dates discussed:   12/15/2014  12/17/2014    Comments:  12/21/2014 IM given and pt for d/c today back to SNF. CRoyal RN MPH, case manager, [phone]

## 2014-12-22 LAB — GLUCOSE, CAPILLARY: Glucose-Capillary: 110 mg/dL — ABNORMAL HIGH (ref 70–99)

## 2014-12-24 DIAGNOSIS — N186 End stage renal disease: Secondary | ICD-10-CM | POA: Diagnosis not present

## 2014-12-24 DIAGNOSIS — Z992 Dependence on renal dialysis: Secondary | ICD-10-CM | POA: Diagnosis not present

## 2014-12-25 DIAGNOSIS — E119 Type 2 diabetes mellitus without complications: Secondary | ICD-10-CM | POA: Diagnosis not present

## 2014-12-25 DIAGNOSIS — Z79899 Other long term (current) drug therapy: Secondary | ICD-10-CM | POA: Diagnosis not present

## 2014-12-25 DIAGNOSIS — F99 Mental disorder, not otherwise specified: Secondary | ICD-10-CM | POA: Diagnosis not present

## 2014-12-25 DIAGNOSIS — R4182 Altered mental status, unspecified: Secondary | ICD-10-CM | POA: Diagnosis not present

## 2014-12-25 DIAGNOSIS — D259 Leiomyoma of uterus, unspecified: Secondary | ICD-10-CM | POA: Diagnosis not present

## 2014-12-25 DIAGNOSIS — D631 Anemia in chronic kidney disease: Secondary | ICD-10-CM | POA: Diagnosis not present

## 2014-12-25 DIAGNOSIS — Z992 Dependence on renal dialysis: Secondary | ICD-10-CM | POA: Diagnosis not present

## 2014-12-25 DIAGNOSIS — R404 Transient alteration of awareness: Secondary | ICD-10-CM | POA: Diagnosis not present

## 2014-12-25 DIAGNOSIS — N189 Chronic kidney disease, unspecified: Secondary | ICD-10-CM | POA: Diagnosis not present

## 2014-12-25 DIAGNOSIS — R531 Weakness: Secondary | ICD-10-CM | POA: Diagnosis present

## 2014-12-25 DIAGNOSIS — I472 Ventricular tachycardia: Secondary | ICD-10-CM | POA: Diagnosis not present

## 2014-12-25 DIAGNOSIS — I509 Heart failure, unspecified: Secondary | ICD-10-CM | POA: Diagnosis not present

## 2014-12-25 DIAGNOSIS — F4320 Adjustment disorder, unspecified: Secondary | ICD-10-CM | POA: Diagnosis not present

## 2014-12-25 DIAGNOSIS — I2510 Atherosclerotic heart disease of native coronary artery without angina pectoris: Secondary | ICD-10-CM | POA: Diagnosis not present

## 2014-12-25 DIAGNOSIS — M6281 Muscle weakness (generalized): Secondary | ICD-10-CM | POA: Diagnosis not present

## 2014-12-25 DIAGNOSIS — F419 Anxiety disorder, unspecified: Secondary | ICD-10-CM | POA: Diagnosis not present

## 2014-12-25 DIAGNOSIS — F29 Unspecified psychosis not due to a substance or known physiological condition: Secondary | ICD-10-CM | POA: Diagnosis not present

## 2014-12-25 DIAGNOSIS — J209 Acute bronchitis, unspecified: Secondary | ICD-10-CM | POA: Diagnosis not present

## 2014-12-25 DIAGNOSIS — I120 Hypertensive chronic kidney disease with stage 5 chronic kidney disease or end stage renal disease: Secondary | ICD-10-CM | POA: Diagnosis not present

## 2014-12-25 DIAGNOSIS — Z7982 Long term (current) use of aspirin: Secondary | ICD-10-CM | POA: Diagnosis not present

## 2014-12-25 DIAGNOSIS — M19042 Primary osteoarthritis, left hand: Secondary | ICD-10-CM | POA: Diagnosis not present

## 2014-12-25 DIAGNOSIS — L03031 Cellulitis of right toe: Secondary | ICD-10-CM | POA: Diagnosis not present

## 2014-12-25 DIAGNOSIS — N186 End stage renal disease: Secondary | ICD-10-CM | POA: Diagnosis not present

## 2014-12-25 DIAGNOSIS — R627 Adult failure to thrive: Secondary | ICD-10-CM | POA: Diagnosis not present

## 2014-12-25 DIAGNOSIS — R079 Chest pain, unspecified: Secondary | ICD-10-CM | POA: Diagnosis not present

## 2014-12-25 DIAGNOSIS — E1322 Other specified diabetes mellitus with diabetic chronic kidney disease: Secondary | ICD-10-CM | POA: Diagnosis not present

## 2014-12-25 DIAGNOSIS — I10 Essential (primary) hypertension: Secondary | ICD-10-CM | POA: Diagnosis not present

## 2014-12-25 DIAGNOSIS — H913 Deaf nonspeaking, not elsewhere classified: Secondary | ICD-10-CM | POA: Diagnosis not present

## 2014-12-25 DIAGNOSIS — D649 Anemia, unspecified: Secondary | ICD-10-CM | POA: Diagnosis not present

## 2014-12-25 DIAGNOSIS — R600 Localized edema: Secondary | ICD-10-CM | POA: Diagnosis not present

## 2014-12-25 DIAGNOSIS — I255 Ischemic cardiomyopathy: Secondary | ICD-10-CM | POA: Diagnosis not present

## 2014-12-25 DIAGNOSIS — I252 Old myocardial infarction: Secondary | ICD-10-CM | POA: Diagnosis not present

## 2014-12-25 DIAGNOSIS — H540 Blindness, both eyes: Secondary | ICD-10-CM | POA: Diagnosis not present

## 2014-12-25 DIAGNOSIS — N2581 Secondary hyperparathyroidism of renal origin: Secondary | ICD-10-CM | POA: Diagnosis not present

## 2014-12-25 DIAGNOSIS — E785 Hyperlipidemia, unspecified: Secondary | ICD-10-CM | POA: Diagnosis not present

## 2014-12-25 DIAGNOSIS — I5032 Chronic diastolic (congestive) heart failure: Secondary | ICD-10-CM | POA: Diagnosis not present

## 2014-12-25 DIAGNOSIS — Z7902 Long term (current) use of antithrombotics/antiplatelets: Secondary | ICD-10-CM | POA: Diagnosis not present

## 2014-12-29 ENCOUNTER — Encounter: Payer: Self-pay | Admitting: Physician Assistant

## 2014-12-29 ENCOUNTER — Ambulatory Visit (INDEPENDENT_AMBULATORY_CARE_PROVIDER_SITE_OTHER): Payer: Medicare Other | Admitting: Physician Assistant

## 2014-12-29 VITALS — BP 130/58 | HR 71 | Ht 68.0 in | Wt 179.2 lb

## 2014-12-29 DIAGNOSIS — Z992 Dependence on renal dialysis: Secondary | ICD-10-CM | POA: Diagnosis not present

## 2014-12-29 DIAGNOSIS — E119 Type 2 diabetes mellitus without complications: Secondary | ICD-10-CM

## 2014-12-29 DIAGNOSIS — N186 End stage renal disease: Secondary | ICD-10-CM

## 2014-12-29 DIAGNOSIS — I10 Essential (primary) hypertension: Secondary | ICD-10-CM

## 2014-12-29 DIAGNOSIS — I255 Ischemic cardiomyopathy: Secondary | ICD-10-CM

## 2014-12-29 DIAGNOSIS — I472 Ventricular tachycardia, unspecified: Secondary | ICD-10-CM

## 2014-12-29 DIAGNOSIS — R079 Chest pain, unspecified: Secondary | ICD-10-CM

## 2014-12-29 MED ORDER — NITROGLYCERIN 0.4 MG SL SUBL: 0.4000 mg | SUBLINGUAL_TABLET | SUBLINGUAL | Status: DC | PRN

## 2014-12-29 NOTE — Assessment & Plan Note (Signed)
Maintaining normal sinus rhythm rate 71 bpm.  She been on the 40 mg of by mouth amiodarone daily since the 23rd. We will decrease this to 200 mg daily on January 13

## 2014-12-29 NOTE — Assessment & Plan Note (Signed)
Patient does appear euvolemic. No acute signs of heart failure. I think she stated at one point she was short of breath upon standing on a couple of occasions. Fluid status is being monitored at dialysis

## 2014-12-29 NOTE — Assessment & Plan Note (Signed)
Initial blood pressure was 130/58. After given one sublingual nitroglycerin and dropped to as low as 98/52 and subsequent reading was 104/56

## 2014-12-29 NOTE — Progress Notes (Signed)
Patient ID: KYESHA BALLA, female   DOB: Mar 20, 1956, 59 y.o.   MRN: [id number]     Date:  12/29/2014   ID:  ALEXANDER MCAULEY, DOB [birth year], MRN [id number]  PCP:  No primary care provider on file.  Primary Cardiologist:  Hilty   Chief Complaint  Patient presents with  . Follow-up    post hospital, cardiac arrest 12/18     History of Present Illness: LARAMIE GELLES is a 59 y.o. female known chronic kidney disease on dialysis, hypertension, recent admission for anterior wall MI medically managed, anemia, who was fecal occult blood negative 2, and deaf and blind since childhood who was sent over from the nursing home after being found unconscious after her dialysis treatment on Friday. Nursing home personnel examined the patient and found her to be pulseless and CPR was initiated. An AED was applied and found a non-shockable rhythm. Patient had return of spontaneous circulation/pulse after ~ 6 minutes of chest compression and administration of intraosseous lidocaine. Before these events no apparent acute event or changes were reported by the nursing home staff.  Upon arrival to the emergency department she was alert and responsive. evaluated in the emergency department by cardiology. After arrival she developed ventricular tachycardia as well as acute dyspnea and hypertensive urgency. She was placed on BiPAP, given IV Lasix, IV nitroglycerin and IV amiodarone. It was suspected patient had a ventricular tach arrest prior to arrival. She stabilized and was able to be admitted to stepdown unit.  2-D echocardiogram revealed an ejection fraction 30-35% with severe hypokinesis of the mid apical antero-septal and apical myocardium. Left atrium was moderate to severely dilated. Tricuspid valve moderate regurgitation. Peak PA pressure was 51 mmHg  Shortly after admission to the nursing unit patient once again decompensated from a respiratory standpoint so critical care medicine was consulted and the patient was  transferred to the ICU. Patient's respiratory symptoms responded to dialysis. She is not a candidate for an AICD and recommended discussion with the family about DO NOT RESUSCITATE status since she is likely to have recurrent V. tach in the future.  Titration of cardiac meds has been difficult due to ongoing hypotension/soft blood pressures. Dialysis was accomplished without significant hypotension.  Palliative care met with patient's son. At this time patient's son is not ready to make patient DO NOT RESUSCITATE or comfort care. He understands that his mother has guarded prognosis .  Stress test on December 1 revealed fixed defects involving the apex, anterior lateral wall and the inferior wall. The apex and anterior lateral wall fixed defects are suggestive for an infarct. No evidence for reversibility or ischemia.  Patient presented today for follow-up evaluation. She was here with an interpreter and communication was very difficult since she has deaf and blind.  While she was here she complained of chest pain. We did give her one sublingual nitroglycerin but was very difficult to determine whether or not it improved her pain. Her blood pressure was low.   On 2 different readings they were 98/52 and 104/56.  This was after the nitroglycerin.      ROS limited by communication barrier.  Wt Readings from Last 3 Encounters:  12/29/14 179 lb 3.2 oz (81.285 kg)  12/21/14 176 lb 12.9 oz (80.2 kg)  12/02/14 178 lb (80.74 kg)     Past Medical History  Diagnosis Date  . Hyperlipidemia   . Hypertension     a. Has previously refused blood pressure meds.   . Deaf  Divorced from husband but lives with him. Has daughter but she does not care for her.  . Bilateral leg edema     a. Chronic.  Marland Kitchen Psychiatric disorder     She frequently exhibits paranoia and has been diagnosed with psychotic d/o NOS during hospital stay in the past. She is tangetial and perseverative during her visits. She apparently has  had a bad experience with mental health in [place] in the past and refuses to discuss mental issues for fear that she will be sent back there. Her paranoia, communication issues, financial woes and lack of fam  . Fibroid uterus   . Anemia     Due to fibroids.  . Heart murmur   . Diabetes mellitus     a. Per PCP note 2012 (A1C 9.2) - pt unwilling to take meds and was educated on risk of uncontrolled DM.  b. A1C 5.9 in 11/2013.  Marland Kitchen Renal disorder   . Coronary artery disease   . MI (myocardial infarction)   . Renal insufficiency     Current Outpatient Prescriptions  Medication Sig Dispense Refill  . Amino Acids-Protein Hydrolys (FEEDING SUPPLEMENT, PRO-STAT SUGAR FREE 64,) LIQD Take 30 mLs by mouth 3 (three) times daily with meals. 900 mL 0  . amiodarone (PACERONE) 400 MG tablet Take 1 tablet (400 mg total) by mouth daily.    Marland Kitchen aspirin 81 MG tablet Take 81 mg by mouth daily. For hypertension    . atorvastatin (LIPITOR) 10 MG tablet Take 10 mg by mouth daily.    . bisacodyl (DULCOLAX) 10 MG suppository Place 1 suppository (10 mg total) rectally daily as needed for mild constipation, moderate constipation or severe constipation. 12 suppository 0  . calcium acetate (PHOSLO) 667 MG capsule Take 1 capsule (667 mg total) by mouth 3 (three) times daily with meals. 30 capsule 1  . clopidogrel (PLAVIX) 75 MG tablet Take 1 tablet (75 mg total) by mouth daily.    Mariane Baumgarten Sodium (DSS) 100 MG CAPS Take 100 mg by mouth 2 (two) times daily. For constipation    . lactulose (CHRONULAC) 10 GM/15ML solution Take 30 mLs (20 g total) by mouth 2 (two) times daily as needed for moderate constipation. 240 mL 0  . levalbuterol (XOPENEX) 0.63 MG/3ML nebulizer solution Take 3 mLs (0.63 mg total) by nebulization every 2 (two) hours as needed for wheezing or shortness of breath. 3 mL 12  . lisinopril (PRINIVIL,ZESTRIL) 2.5 MG tablet Take 1 tablet (2.5 mg total) by mouth daily.    . metoprolol succinate (TOPROL-XL) 50 MG 24  hr tablet Take 1 tablet (50 mg total) by mouth daily. Take with or immediately following a meal.    . multivitamin (RENA-VIT) TABS tablet Take 1 tablet by mouth at bedtime. 30 tablet 0  . nitroGLYCERIN (NITROSTAT) 0.4 MG SL tablet Place 1 tablet (0.4 mg total) under the tongue every 5 (five) minutes as needed for chest pain.  12  . promethazine (PHENERGAN) 12.5 MG suppository Place 12.5 mg rectally every 4 (four) hours as needed for nausea or vomiting.     Current Facility-Administered Medications  Medication Dose Route Frequency Provider Last Rate Last Dose  . nitroGLYCERIN (NITROSTAT) SL tablet 0.4 mg  0.4 mg Sublingual Q5 min PRN Brett Canales, PA-C        Allergies:   No Known Allergies  Social History:  The patient  reports that she has never smoked. She does not have any smokeless tobacco history on file. She  reports that she does not drink alcohol or use illicit drugs.   Family history:   Family History  Problem Relation Age of Onset  . Other Father     Drowned from fishing?  . Heart disease      ROS:  Please see the history of present illness.  All other systems reviewed and negative.   PHYSICAL EXAM: VS:  BP 130/58 mmHg  Pulse 71  Ht _0  (1.727 m)  Wt 179 lb 3.2 oz (81.285 kg)  BMI 27.25 kg/m2 Well nourished, well developed, in no acute distress.  Patient is sitting in wheelchair. Respiratory rate is normal and she appears comfortable HEENT:  extraocular movements are intact.  Neck: No cervical lymphadenopathy. Cardiac: Regular rate and rhythm with 1/6 sys murmur.  No rubs or gallops. Lungs:  clear to auscultation bilaterally, no wheezing, rhonchi or rales Abd: soft, nontender, positive bowel sounds all quadrants,  Ext: no lower extremity edema.  2+ right radial and dorsalis pedis pulses. Skin: warm and dry Neuro:  Grossly normal  EKG:    Sinus rhythm rate 71 bpm inferior and anterior Q waves  ASSESSMENT AND PLAN:  Problem List Items Addressed This Visit     Sustained VT (ventricular tachycardia)    Maintaining normal sinus rhythm rate 71 bpm.  She been on the 40 mg of by mouth amiodarone daily since the 23rd. We will decrease this to 200 mg daily on January 13    Relevant Medications      nitroGLYCERIN (NITROSTAT) SL tablet 0.4 mg   Essential hypertension (Chronic)    Initial blood pressure was 130/58. After given one sublingual nitroglycerin and dropped to as low as 98/52 and subsequent reading was 104/56    Relevant Medications      nitroGLYCERIN (NITROSTAT) SL tablet 0.4 mg   End stage renal disease on dialysis   Diabetes type 2, controlled   Chest pain at rest    All the patient was here she developed chest pain. We give her one sublingual nitroglycerin however, due to communication barrier was difficult to ascertain if the pain decreased. Her blood pressure dropped sig and significantly enough that I did not feel safe giving her a second sublingual nitroglycerin. Also do not feel that her blood pressure will tolerate a Tabron-acting nitrate.  She just had a nuclear stress test December 1 which was nonischemic and showed fixed defects involving the apex, anterior lateral wall and the inferior wall. The apex and anterior lateral wall fixed defects.   We'll have her follow-up in 1 month's time Dr. Debara Pickett    Cardiomyopathy, ischemic-EF 30-35%    Patient does appear euvolemic. No acute signs of heart failure. I think she stated at one point she was short of breath upon standing on a couple of occasions. Fluid status is being monitored at dialysis    Relevant Medications      nitroGLYCERIN (NITROSTAT) SL tablet 0.4 mg    Other Visit Diagnoses    Chest pain, unspecified chest pain type    -  Primary    Relevant Medications       nitroGLYCERIN (NITROSTAT) SL tablet 0.4 mg    Other Relevant Orders       EKG 12-Lead

## 2014-12-29 NOTE — Patient Instructions (Signed)
Your physician recommends that you schedule a follow-up appointment in: 1 month with Dr.Hilty.

## 2014-12-29 NOTE — Assessment & Plan Note (Signed)
All the patient was here she developed chest pain. We give her one sublingual nitroglycerin however, due to communication barrier was difficult to ascertain if the pain decreased. Her blood pressure dropped sig and significantly enough that I did not feel safe giving her a second sublingual nitroglycerin. Also do not feel that her blood pressure will tolerate a Leverett-acting nitrate.  She just had a nuclear stress test December 1 which was nonischemic and showed fixed defects involving the apex, anterior lateral wall and the inferior wall. The apex and anterior lateral wall fixed defects.   We'll have her follow-up in 1 month's time Dr. Debara Pickett

## 2015-01-09 ENCOUNTER — Non-Acute Institutional Stay (SKILLED_NURSING_FACILITY): Payer: Medicare Other | Admitting: Internal Medicine

## 2015-01-09 ENCOUNTER — Encounter: Payer: Self-pay | Admitting: Internal Medicine

## 2015-01-09 DIAGNOSIS — I10 Essential (primary) hypertension: Secondary | ICD-10-CM

## 2015-01-09 DIAGNOSIS — E785 Hyperlipidemia, unspecified: Secondary | ICD-10-CM | POA: Diagnosis not present

## 2015-01-09 DIAGNOSIS — E1322 Other specified diabetes mellitus with diabetic chronic kidney disease: Secondary | ICD-10-CM | POA: Diagnosis not present

## 2015-01-09 DIAGNOSIS — I255 Ischemic cardiomyopathy: Secondary | ICD-10-CM | POA: Diagnosis not present

## 2015-01-09 DIAGNOSIS — D631 Anemia in chronic kidney disease: Secondary | ICD-10-CM | POA: Diagnosis not present

## 2015-01-09 DIAGNOSIS — N186 End stage renal disease: Secondary | ICD-10-CM

## 2015-01-09 DIAGNOSIS — I5032 Chronic diastolic (congestive) heart failure: Secondary | ICD-10-CM

## 2015-01-09 DIAGNOSIS — N189 Chronic kidney disease, unspecified: Secondary | ICD-10-CM

## 2015-01-09 DIAGNOSIS — E1122 Type 2 diabetes mellitus with diabetic chronic kidney disease: Secondary | ICD-10-CM

## 2015-01-09 NOTE — Progress Notes (Signed)
Patient ID: [name], female   DOB: 08/25/1956, 59 y.o.   MRN: [id number]    Facility  [facility]    Place of Service:   SNF   No Known Allergies  Chief Complaint  Patient presents with  . Medical Management of Chronic Issues    DM II, ESRD/HD, hx anemia, pulmonary HTN, HTN, hx Vtach not ICD candidate, deafness/blindness    HPI:  59 yo female [facility] term resident seen today for above.She is deaf and blind and cannot provide any hx. There have been no nursing issues. She is eating well. VSS are stable   Medications: Patient's Medications  New Prescriptions   No medications on file  Previous Medications   AMINO ACIDS-PROTEIN HYDROLYS (FEEDING SUPPLEMENT, PRO-STAT SUGAR FREE 64,) LIQD    Take 30 mLs by mouth 3 (three) times daily with meals.   AMIODARONE (PACERONE) 400 MG TABLET    Take 1 tablet (400 mg total) by mouth daily.   ASPIRIN 81 MG TABLET    Take 81 mg by mouth daily. For hypertension   ATORVASTATIN (LIPITOR) 10 MG TABLET    Take 10 mg by mouth daily.   BISACODYL (DULCOLAX) 10 MG SUPPOSITORY    Place 1 suppository (10 mg total) rectally daily as needed for mild constipation, moderate constipation or severe constipation.   CALCIUM ACETATE (PHOSLO) 667 MG CAPSULE    Take 1 capsule (667 mg total) by mouth 3 (three) times daily with meals.   CLOPIDOGREL (PLAVIX) 75 MG TABLET    Take 1 tablet (75 mg total) by mouth daily.   DOCUSATE SODIUM (DSS) 100 MG CAPS    Take 100 mg by mouth 2 (two) times daily. For constipation   LACTULOSE (CHRONULAC) 10 GM/15ML SOLUTION    Take 30 mLs (20 g total) by mouth 2 (two) times daily as needed for moderate constipation.   LEVALBUTEROL (XOPENEX) 0.63 MG/3ML NEBULIZER SOLUTION    Take 3 mLs (0.63 mg total) by nebulization every 2 (two) hours as needed for wheezing or shortness of breath.   LISINOPRIL (PRINIVIL,ZESTRIL) 2.5 MG TABLET    Take 1 tablet (2.5 mg total) by mouth daily.   METOPROLOL SUCCINATE (TOPROL-XL) 50 MG 24 HR TABLET     Take 1 tablet (50 mg total) by mouth daily. Take with or immediately following a meal.   MULTIVITAMIN (RENA-VIT) TABS TABLET    Take 1 tablet by mouth at bedtime.   NITROGLYCERIN (NITROSTAT) 0.4 MG SL TABLET    Place 1 tablet (0.4 mg total) under the tongue every 5 (five) minutes as needed for chest pain.   PROMETHAZINE (PHENERGAN) 12.5 MG SUPPOSITORY    Place 12.5 mg rectally every 4 (four) hours as needed for nausea or vomiting.  Modified Medications   No medications on file  Discontinued Medications   No medications on file     Review of Systems  Unable to obtain due to pt is blind and deaf.   Filed Vitals:   01/09/15 2350  BP: 122/58  Pulse: 74  Temp: 97.4 F (36.3 C)  SpO2: 94%   There is no weight on file to calculate BMI.  Physical Exam CONSTITUTIONAL: Looks well in NAD. Awake and alert HEENT: Oropharynx clear and without exudate. MMM NECK: Supple. Nontender. No palpable cervical or supraclavicular lymph nodes. (+) carotid bruit b/l.   CVS: Regular rate without murmur, gallop or rub. LUNGS: CTA b/l no wheezing, rales or rhonchi. ABDOMEN: Bowel sounds present x 4. Soft, nontender, nondistended.  No palpable mass or bruit EXTREMITIES: No edema b/l. Distal pulses palpable. No calf tenderness. Left arm AVF with palpable thrill and audible bruit PSYCH: Affect, behavior and mood normal   Labs reviewed: . Hgb A1c MFr Bld 07/15/2014 5.3  4.0 - 6.0 % Final   CMP Latest Ref Rng 12/21/2014 12/20/2014 12/19/2014  Glucose 70 - 99 mg/dL 110(H) 110(H) 115(H)  BUN 6 - 23 mg/dL 49(H) 80(H) 66(H)  Creatinine 0.50 - 1.10 mg/dL 4.89(H) 7.23(H) 6.48(H)  Sodium 135 - 145 mmol/L 129(L) 119(LL) 123(L)  Potassium 3.5 - 5.1 mmol/L 4.4 4.7 4.3  Chloride 96 - 112 mEq/L 93(L) 85(L) 88(L)  CO2 19 - 32 mmol/L 26 22 23   Calcium 8.4 - 10.5 mg/dL 8.2(L) 8.2(L) 8.7  Total Protein 6.0 - 8.3 g/dL - - -  Total Bilirubin 0.3 - 1.2 mg/dL - - -  Alkaline Phos 39 - 117 U/L - - -  AST 0 - 37 U/L - -  -  ALT 0 - 35 U/L - - -    CBC Latest Ref Rng 12/21/2014 12/20/2014 12/19/2014  WBC 4.0 - 10.5 K/uL 5.9 6.7 8.1  Hemoglobin 12.0 - 15.0 g/dL 8.8(L) 9.0(L) 9.5(L)  Hematocrit 36.0 - 46.0 % 26.9(L) 26.6(L) 29.0(L)  Platelets 150 - 400 K/uL 341 322 316    Lipid Panel     Component Value Date/Time   CHOL 237* 02/09/2010 1845   TRIG 78 02/09/2010 1845   HDL 51 02/09/2010 1845   CHOLHDL 4.6 Ratio 02/09/2010 1845   VLDL 16 02/09/2010 1845   LDLCALC 170* 02/09/2010 1845   LDLDIRECT 170* 08/09/2009 2020     Assessment/Plan    ICD-9-CM ICD-10-CM   1. Diabetes mellitus with ESRD (end-stage renal disease) 250.40 E13.22    585.6 N18.6   2. Anemia in chronic kidney disease 285.21 N18.9    585.9 D63.1   3. Essential hypertension 401.9 I10   4. HLD (hyperlipidemia) 272.4 E78.5   5. Chronic diastolic CHF (congestive heart failure) 428.32 I50.32    428.0    6. Cardiomyopathy, ischemic-EF 30-35% 414.8 I25.5    --continue current medications as ordered. She is currently medically stable  --f/u with HD as scheduled  --continue fluid restriction of 1200cc daily  --check A1c this month  --keep all scheduled appts   Hrishikesh Hoeg S. Perlie Gold  [facility] [address] [place], [place] [address] [phone] Office (Wednesdays and Fridays 8 AM - 5 PM) [phone] Cell (Monday-Friday 8 AM - 5 PM)

## 2015-01-13 ENCOUNTER — Emergency Department (HOSPITAL_COMMUNITY): Payer: Medicare Other

## 2015-01-13 ENCOUNTER — Encounter (HOSPITAL_COMMUNITY): Payer: Self-pay | Admitting: Emergency Medicine

## 2015-01-13 ENCOUNTER — Observation Stay (HOSPITAL_COMMUNITY)
Admission: EM | Admit: 2015-01-13 | Discharge: 2015-01-16 | Disposition: A | Discharge status: Skilled Nursing Facility | Payer: Medicare Other | Attending: Internal Medicine | Admitting: Internal Medicine

## 2015-01-13 ENCOUNTER — Observation Stay (HOSPITAL_COMMUNITY): Payer: Medicare Other

## 2015-01-13 DIAGNOSIS — E785 Hyperlipidemia, unspecified: Secondary | ICD-10-CM | POA: Diagnosis present

## 2015-01-13 DIAGNOSIS — R4182 Altered mental status, unspecified: Secondary | ICD-10-CM | POA: Diagnosis present

## 2015-01-13 DIAGNOSIS — N186 End stage renal disease: Secondary | ICD-10-CM | POA: Diagnosis not present

## 2015-01-13 DIAGNOSIS — J189 Pneumonia, unspecified organism: Secondary | ICD-10-CM

## 2015-01-13 DIAGNOSIS — D649 Anemia, unspecified: Secondary | ICD-10-CM | POA: Insufficient documentation

## 2015-01-13 DIAGNOSIS — I120 Hypertensive chronic kidney disease with stage 5 chronic kidney disease or end stage renal disease: Secondary | ICD-10-CM | POA: Diagnosis not present

## 2015-01-13 DIAGNOSIS — R627 Adult failure to thrive: Principal | ICD-10-CM | POA: Diagnosis present

## 2015-01-13 DIAGNOSIS — L0390 Cellulitis, unspecified: Secondary | ICD-10-CM

## 2015-01-13 DIAGNOSIS — J209 Acute bronchitis, unspecified: Secondary | ICD-10-CM | POA: Diagnosis present

## 2015-01-13 DIAGNOSIS — I10 Essential (primary) hypertension: Secondary | ICD-10-CM

## 2015-01-13 DIAGNOSIS — I469 Cardiac arrest, cause unspecified: Secondary | ICD-10-CM

## 2015-01-13 DIAGNOSIS — R6251 Failure to thrive (child): Secondary | ICD-10-CM

## 2015-01-13 DIAGNOSIS — R931 Abnormal findings on diagnostic imaging of heart and coronary circulation: Secondary | ICD-10-CM

## 2015-01-13 DIAGNOSIS — E119 Type 2 diabetes mellitus without complications: Secondary | ICD-10-CM | POA: Diagnosis not present

## 2015-01-13 DIAGNOSIS — D631 Anemia in chronic kidney disease: Secondary | ICD-10-CM | POA: Diagnosis present

## 2015-01-13 DIAGNOSIS — H9190 Unspecified hearing loss, unspecified ear: Secondary | ICD-10-CM

## 2015-01-13 DIAGNOSIS — R079 Chest pain, unspecified: Secondary | ICD-10-CM

## 2015-01-13 DIAGNOSIS — N2581 Secondary hyperparathyroidism of renal origin: Secondary | ICD-10-CM | POA: Insufficient documentation

## 2015-01-13 DIAGNOSIS — E1122 Type 2 diabetes mellitus with diabetic chronic kidney disease: Secondary | ICD-10-CM | POA: Diagnosis present

## 2015-01-13 DIAGNOSIS — Z7902 Long term (current) use of antithrombotics/antiplatelets: Secondary | ICD-10-CM | POA: Insufficient documentation

## 2015-01-13 DIAGNOSIS — R600 Localized edema: Secondary | ICD-10-CM | POA: Insufficient documentation

## 2015-01-13 DIAGNOSIS — I252 Old myocardial infarction: Secondary | ICD-10-CM | POA: Insufficient documentation

## 2015-01-13 DIAGNOSIS — I214 Non-ST elevation (NSTEMI) myocardial infarction: Secondary | ICD-10-CM

## 2015-01-13 DIAGNOSIS — N189 Chronic kidney disease, unspecified: Secondary | ICD-10-CM

## 2015-01-13 DIAGNOSIS — L97509 Non-pressure chronic ulcer of other part of unspecified foot with unspecified severity: Secondary | ICD-10-CM | POA: Diagnosis present

## 2015-01-13 DIAGNOSIS — Z7982 Long term (current) use of aspirin: Secondary | ICD-10-CM | POA: Insufficient documentation

## 2015-01-13 DIAGNOSIS — H540 Blindness, both eyes: Secondary | ICD-10-CM | POA: Insufficient documentation

## 2015-01-13 DIAGNOSIS — F99 Mental disorder, not otherwise specified: Secondary | ICD-10-CM | POA: Insufficient documentation

## 2015-01-13 DIAGNOSIS — L03031 Cellulitis of right toe: Secondary | ICD-10-CM | POA: Insufficient documentation

## 2015-01-13 DIAGNOSIS — I071 Rheumatic tricuspid insufficiency: Secondary | ICD-10-CM

## 2015-01-13 DIAGNOSIS — G9340 Encephalopathy, unspecified: Secondary | ICD-10-CM | POA: Insufficient documentation

## 2015-01-13 DIAGNOSIS — M6281 Muscle weakness (generalized): Secondary | ICD-10-CM | POA: Diagnosis not present

## 2015-01-13 DIAGNOSIS — I255 Ischemic cardiomyopathy: Secondary | ICD-10-CM | POA: Diagnosis present

## 2015-01-13 DIAGNOSIS — R531 Weakness: Secondary | ICD-10-CM | POA: Diagnosis not present

## 2015-01-13 DIAGNOSIS — H547 Unspecified visual loss: Secondary | ICD-10-CM

## 2015-01-13 DIAGNOSIS — Z515 Encounter for palliative care: Secondary | ICD-10-CM

## 2015-01-13 DIAGNOSIS — I471 Supraventricular tachycardia, unspecified: Secondary | ICD-10-CM | POA: Diagnosis present

## 2015-01-13 DIAGNOSIS — I472 Ventricular tachycardia, unspecified: Secondary | ICD-10-CM

## 2015-01-13 DIAGNOSIS — I272 Pulmonary hypertension, unspecified: Secondary | ICD-10-CM

## 2015-01-13 DIAGNOSIS — I2109 ST elevation (STEMI) myocardial infarction involving other coronary artery of anterior wall: Secondary | ICD-10-CM

## 2015-01-13 DIAGNOSIS — Z992 Dependence on renal dialysis: Secondary | ICD-10-CM | POA: Insufficient documentation

## 2015-01-13 DIAGNOSIS — I509 Heart failure, unspecified: Secondary | ICD-10-CM | POA: Diagnosis not present

## 2015-01-13 DIAGNOSIS — D259 Leiomyoma of uterus, unspecified: Secondary | ICD-10-CM | POA: Insufficient documentation

## 2015-01-13 DIAGNOSIS — I2510 Atherosclerotic heart disease of native coronary artery without angina pectoris: Secondary | ICD-10-CM | POA: Diagnosis not present

## 2015-01-13 DIAGNOSIS — Z79899 Other long term (current) drug therapy: Secondary | ICD-10-CM | POA: Insufficient documentation

## 2015-01-13 DIAGNOSIS — H913 Deaf nonspeaking, not elsewhere classified: Secondary | ICD-10-CM | POA: Diagnosis present

## 2015-01-13 DIAGNOSIS — I110 Hypertensive heart disease with heart failure: Secondary | ICD-10-CM | POA: Diagnosis present

## 2015-01-13 HISTORY — DX: Dependence on renal dialysis: N18.6

## 2015-01-13 HISTORY — DX: Dependence on renal dialysis: Z99.2

## 2015-01-13 HISTORY — DX: Unspecified visual loss: H54.7

## 2015-01-13 LAB — CBC WITH DIFFERENTIAL/PLATELET
Basophils Absolute: 0.1 10*3/uL (ref 0.0–0.1)
Basophils Relative: 1 % (ref 0–1)
Eosinophils Absolute: 0.3 10*3/uL (ref 0.0–0.7)
Eosinophils Relative: 5 % (ref 0–5)
HCT: 31.8 % — ABNORMAL LOW (ref 36.0–46.0)
Hemoglobin: 10.4 g/dL — ABNORMAL LOW (ref 12.0–15.0)
Lymphocytes Relative: 25 % (ref 12–46)
Lymphs Abs: 1.4 10*3/uL (ref 0.7–4.0)
MCH: 31.7 pg (ref 26.0–34.0)
MCHC: 32.7 g/dL (ref 30.0–36.0)
MCV: 97 fL (ref 78.0–100.0)
Monocytes Absolute: 0.8 10*3/uL (ref 0.1–1.0)
Monocytes Relative: 14 % — ABNORMAL HIGH (ref 3–12)
Neutro Abs: 3 10*3/uL (ref 1.7–7.7)
Neutrophils Relative %: 55 % (ref 43–77)
Platelets: 258 10*3/uL (ref 150–400)
RBC: 3.28 MIL/uL — ABNORMAL LOW (ref 3.87–5.11)
RDW: 15.4 % (ref 11.5–15.5)
WBC: 5.6 10*3/uL (ref 4.0–10.5)

## 2015-01-13 LAB — BASIC METABOLIC PANEL
Anion gap: 13 (ref 5–15)
BUN: 55 mg/dL — ABNORMAL HIGH (ref 6–23)
CO2: 27 mmol/L (ref 19–32)
Calcium: 9.1 mg/dL (ref 8.4–10.5)
Chloride: 93 mEq/L — ABNORMAL LOW (ref 96–112)
Creatinine, Ser: 5.96 mg/dL — ABNORMAL HIGH (ref 0.50–1.10)
GFR calc Af Amer: 8 mL/min — ABNORMAL LOW (ref >90)
GFR calc non Af Amer: 7 mL/min — ABNORMAL LOW (ref >90)
Glucose, Bld: 111 mg/dL — ABNORMAL HIGH (ref 70–99)
Potassium: 4.8 mmol/L (ref 3.5–5.1)
Sodium: 133 mmol/L — ABNORMAL LOW (ref 135–145)

## 2015-01-13 LAB — URINE MICROSCOPIC-ADD ON

## 2015-01-13 LAB — URINALYSIS, ROUTINE W REFLEX MICROSCOPIC
Glucose, UA: NEGATIVE mg/dL
Hgb urine dipstick: NEGATIVE
Ketones, ur: 15 mg/dL — AB
Nitrite: NEGATIVE
Protein, ur: 100 mg/dL — AB
Specific Gravity, Urine: 1.02 (ref 1.005–1.030)
Urobilinogen, UA: 0.2 mg/dL (ref 0.0–1.0)
pH: 5 (ref 5.0–8.0)

## 2015-01-13 LAB — CBC
HCT: 32.6 % — ABNORMAL LOW (ref 36.0–46.0)
Hemoglobin: 10.7 g/dL — ABNORMAL LOW (ref 12.0–15.0)
MCH: 31.8 pg (ref 26.0–34.0)
MCHC: 32.8 g/dL (ref 30.0–36.0)
MCV: 96.7 fL (ref 78.0–100.0)
Platelets: 251 10*3/uL (ref 150–400)
RBC: 3.37 MIL/uL — ABNORMAL LOW (ref 3.87–5.11)
RDW: 15.4 % (ref 11.5–15.5)
WBC: 5 10*3/uL (ref 4.0–10.5)

## 2015-01-13 LAB — I-STAT CG4 LACTIC ACID, ED: Lactic Acid, Venous: 1.29 mmol/L (ref 0.5–2.2)

## 2015-01-13 LAB — I-STAT TROPONIN, ED: Troponin i, poc: 0.01 ng/mL (ref 0.00–0.08)

## 2015-01-13 LAB — CREATININE, SERUM
Creatinine, Ser: 6.39 mg/dL — ABNORMAL HIGH (ref 0.50–1.10)
GFR calc Af Amer: 7 mL/min — ABNORMAL LOW (ref >90)
GFR calc non Af Amer: 6 mL/min — ABNORMAL LOW (ref >90)

## 2015-01-13 LAB — BRAIN NATRIURETIC PEPTIDE: B Natriuretic Peptide: 4164.6 pg/mL — ABNORMAL HIGH (ref 0.0–100.0)

## 2015-01-13 MED ORDER — GUAIFENESIN ER 600 MG PO TB12
600.0000 mg | ORAL_TABLET | Freq: Two times a day (BID) | ORAL | Status: DC
  Administered 2015-01-13 – 2015-01-16 (×5): 600 mg via ORAL
  Filled 2015-01-13 (×7): qty 1

## 2015-01-13 MED ORDER — LEVALBUTEROL HCL 0.63 MG/3ML IN NEBU: 0.6300 mg | INHALATION_SOLUTION | RESPIRATORY_TRACT | Status: DC | PRN

## 2015-01-13 MED ORDER — ASPIRIN 81 MG PO CHEW
81.0000 mg | CHEWABLE_TABLET | Freq: Every day | ORAL | Status: DC
  Administered 2015-01-13 – 2015-01-16 (×3): 81 mg via ORAL
  Filled 2015-01-13 (×5): qty 1

## 2015-01-13 MED ORDER — ONDANSETRON HCL 4 MG PO TABS: 4.0000 mg | ORAL_TABLET | Freq: Four times a day (QID) | ORAL | Status: DC | PRN

## 2015-01-13 MED ORDER — LACTULOSE 10 GM/15ML PO SOLN
20.0000 g | Freq: Two times a day (BID) | ORAL | Status: DC | PRN
  Filled 2015-01-13: qty 30

## 2015-01-13 MED ORDER — NITROGLYCERIN 0.4 MG SL SUBL: 0.4000 mg | SUBLINGUAL_TABLET | SUBLINGUAL | Status: DC | PRN

## 2015-01-13 MED ORDER — LEVOFLOXACIN 500 MG PO TABS: 500.0000 mg | ORAL_TABLET | ORAL | Status: DC

## 2015-01-13 MED ORDER — RENA-VITE PO TABS
1.0000 | ORAL_TABLET | Freq: Every day | ORAL | Status: DC
  Administered 2015-01-13 – 2015-01-15 (×3): 1 via ORAL
  Filled 2015-01-13 (×4): qty 1

## 2015-01-13 MED ORDER — DSS 100 MG PO CAPS: 100.0000 mg | ORAL_CAPSULE | Freq: Two times a day (BID) | ORAL | Status: DC

## 2015-01-13 MED ORDER — LISINOPRIL 2.5 MG PO TABS
2.5000 mg | ORAL_TABLET | Freq: Every day | ORAL | Status: DC
  Administered 2015-01-13 – 2015-01-16 (×3): 2.5 mg via ORAL
  Filled 2015-01-13 (×5): qty 1

## 2015-01-13 MED ORDER — SODIUM CHLORIDE 0.9 % IV SOLN
INTRAVENOUS | Status: AC
  Administered 2015-01-13: 16:00:00 via INTRAVENOUS

## 2015-01-13 MED ORDER — AMIODARONE HCL 200 MG PO TABS
200.0000 mg | ORAL_TABLET | Freq: Every day | ORAL | Status: DC
  Administered 2015-01-13 – 2015-01-16 (×3): 200 mg via ORAL
  Filled 2015-01-13 (×5): qty 1

## 2015-01-13 MED ORDER — ACETAMINOPHEN 650 MG RE SUPP: 650.0000 mg | Freq: Four times a day (QID) | RECTAL | Status: DC | PRN

## 2015-01-13 MED ORDER — METOPROLOL SUCCINATE ER 50 MG PO TB24
50.0000 mg | ORAL_TABLET | Freq: Every day | ORAL | Status: DC
  Administered 2015-01-13 – 2015-01-16 (×3): 50 mg via ORAL
  Filled 2015-01-13 (×5): qty 1

## 2015-01-13 MED ORDER — PRO-STAT SUGAR FREE PO LIQD
30.0000 mL | Freq: Three times a day (TID) | ORAL | Status: DC
  Administered 2015-01-13 – 2015-01-15 (×5): 30 mL via ORAL
  Administered 2015-01-16: 18:00:00 via ORAL
  Administered 2015-01-16 (×2): 30 mL via ORAL
  Filled 2015-01-13 (×11): qty 30

## 2015-01-13 MED ORDER — HEPARIN SODIUM (PORCINE) 1000 UNIT/ML DIALYSIS
40.0000 [IU]/kg | INTRAMUSCULAR | Status: DC | PRN
  Filled 2015-01-13: qty 4

## 2015-01-13 MED ORDER — DARBEPOETIN ALFA 60 MCG/0.3ML IJ SOSY: 60.0000 ug | PREFILLED_SYRINGE | INTRAMUSCULAR | Status: DC

## 2015-01-13 MED ORDER — LEVOFLOXACIN IN D5W 750 MG/150ML IV SOLN
750.0000 mg | Freq: Once | INTRAVENOUS | Status: AC
  Administered 2015-01-13: 750 mg via INTRAVENOUS
  Filled 2015-01-13: qty 150

## 2015-01-13 MED ORDER — AMIODARONE HCL 200 MG PO TABS: 200.0000 mg | ORAL_TABLET | Freq: Every day | ORAL | Status: DC

## 2015-01-13 MED ORDER — SODIUM CHLORIDE 0.9 % IV SOLN: 62.5000 mg | INTRAVENOUS | Status: DC

## 2015-01-13 MED ORDER — ONDANSETRON HCL 4 MG/2ML IJ SOLN: 4.0000 mg | Freq: Four times a day (QID) | INTRAMUSCULAR | Status: DC | PRN

## 2015-01-13 MED ORDER — BISACODYL 10 MG RE SUPP: 10.0000 mg | Freq: Every day | RECTAL | Status: DC | PRN

## 2015-01-13 MED ORDER — ATORVASTATIN CALCIUM 10 MG PO TABS
10.0000 mg | ORAL_TABLET | Freq: Every day | ORAL | Status: DC
  Administered 2015-01-13 – 2015-01-16 (×3): 10 mg via ORAL
  Filled 2015-01-13 (×5): qty 1

## 2015-01-13 MED ORDER — CLOPIDOGREL BISULFATE 75 MG PO TABS
75.0000 mg | ORAL_TABLET | Freq: Every day | ORAL | Status: DC
  Administered 2015-01-13 – 2015-01-16 (×3): 75 mg via ORAL
  Filled 2015-01-13 (×5): qty 1

## 2015-01-13 MED ORDER — ACETAMINOPHEN 325 MG PO TABS: 650.0000 mg | ORAL_TABLET | Freq: Four times a day (QID) | ORAL | Status: DC | PRN

## 2015-01-13 MED ORDER — DOCUSATE SODIUM 100 MG PO CAPS
100.0000 mg | ORAL_CAPSULE | Freq: Two times a day (BID) | ORAL | Status: DC
  Administered 2015-01-13 – 2015-01-16 (×6): 100 mg via ORAL
  Filled 2015-01-13 (×8): qty 1

## 2015-01-13 MED ORDER — CALCIUM ACETATE 667 MG PO CAPS
667.0000 mg | ORAL_CAPSULE | Freq: Three times a day (TID) | ORAL | Status: DC
  Administered 2015-01-14 – 2015-01-16 (×7): 667 mg via ORAL
  Filled 2015-01-13 (×11): qty 1

## 2015-01-13 MED ORDER — HEPARIN SODIUM (PORCINE) 5000 UNIT/ML IJ SOLN
5000.0000 [IU] | Freq: Three times a day (TID) | INTRAMUSCULAR | Status: DC
  Administered 2015-01-13 – 2015-01-16 (×9): 5000 [IU] via SUBCUTANEOUS
  Filled 2015-01-13 (×11): qty 1

## 2015-01-13 NOTE — Progress Notes (Signed)
Admission note:  Arrival Method: Stretcher from ED Mental Orientation:[place] to assess pt is deaf, mute, and blind Telemetry: Box 03  Assessment: See doc flowsheet Skin: Dry; intact  IV: Right Forearm Pain: None assessed  Safety Measures: Bed alarm activate, yellow socks, call light within reach Admission Screening: to be completed 6700 Orientation: Patient has been oriented to the unit, staff and to the room.

## 2015-01-13 NOTE — ED Notes (Signed)
Pt. Is death, blind; pt. Is from [facility]; staff, "not eating well, but is taking meds." feels warm to touch. Staff use some tactile method with her hands to communicate?

## 2015-01-13 NOTE — ED Provider Notes (Signed)
CSN: [id number]     Arrival date & time 01/13/15  0910 History   First MD Initiated Contact with Patient 01/13/15 0913     Chief Complaint  Patient presents with  . Failure To Thrive  . Fever      Patient is a 59 y.o. female presenting with fever. The history is provided by the nursing home and the EMS personnel. The history is limited by the condition of the patient (Pt is non-verbal per baseline).  Fever   Pt was seen at 0920. Per EMS and NH report: NH staff states pt has not been eating well and "not acting like herself" for the past few days. Has had a moist cough and "felt warm." No reported fevers, no vomiting/diarrhea, no falls. Pt's son states pt was "unable to stand" today due to generalized weakness. 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 Pt was due for her usual HD today, but NH sent pt to the ED today instead.    Past Medical History  Diagnosis Date  . Hyperlipidemia   . Hypertension     a. Has previously refused blood pressure meds.   . Deaf     Divorced from husband but lives with him. Has daughter but she does not care for her.  . Bilateral leg edema     a. Chronic.  Marland Kitchen Psychiatric disorder     She frequently exhibits paranoia and has been diagnosed with psychotic d/o NOS during hospital stay in the past. She is tangetial and perseverative during her visits. She apparently has had a bad experience with mental health in [place] in the past and refuses to discuss mental issues for fear that she will be sent back there. Her paranoia, communication issues, financial woes and lack of fam  . Fibroid uterus   . Anemia     Due to fibroids.  . Heart murmur   . Diabetes mellitus     a. Per PCP note 2012 (A1C 9.2) - pt unwilling to take meds and was educated on risk of uncontrolled DM.  b. A1C 5.9 in 11/2013.  Marland Kitchen Coronary artery disease   . MI (myocardial infarction)   . Blind   . ESRD on hemodialysis     Mon, Wed, Fri   Past Surgical History  Procedure Laterality Date  . No past  surgeries    . Insertion of dialysis catheter Right 01/02/2014    Procedure: INSERTION OF DIALYSIS CATHETER;  Surgeon: Rosetta Posner, MD;  Location: [facility];  Service: Vascular;  Laterality: Right;  . Av fistula placement Left 01/02/2014    Procedure: ARTERIOVENOUS (AV) FISTULA CREATION;  Surgeon: Rosetta Posner, MD;  Location: [facility] OR;  Service: Vascular;  Laterality: Left;   Family History  Problem Relation Age of Onset  . Other Father     Drowned from fishing?  . Heart disease     History  Substance Use Topics  . Smoking status: Never Smoker   . Smokeless tobacco: Not on file     Comment: Prior dip  . Alcohol Use: No   OB History    Gravida Para Term Preterm AB TAB SAB Ectopic Multiple Living   1 1        1      Review of Systems  Unable to perform ROS: Patient nonverbal  Constitutional: Positive for fever.       Allergies  Review of patient's allergies indicates no known allergies.  Home Medications   Prior to Admission medications  Medication Sig Start Date End Date Taking? Authorizing Provider  Amino Acids-Protein Hydrolys (FEEDING SUPPLEMENT, PRO-STAT SUGAR FREE 64,) LIQD Take 30 mLs by mouth 3 (three) times daily with meals. 12/21/14   Delfina Redwood, MD  amiodarone (PACERONE) 400 MG tablet Take 1 tablet (400 mg total) by mouth daily. 12/21/14   Delfina Redwood, MD  aspirin 81 MG tablet Take 81 mg by mouth daily. For hypertension    Historical Provider, MD  atorvastatin (LIPITOR) 10 MG tablet Take 10 mg by mouth daily.    Historical Provider, MD  bisacodyl (DULCOLAX) 10 MG suppository Place 1 suppository (10 mg total) rectally daily as needed for mild constipation, moderate constipation or severe constipation. 01/08/14   Kelvin Cellar, MD  calcium acetate (PHOSLO) 667 MG capsule Take 1 capsule (667 mg total) by mouth 3 (three) times daily with meals. 01/08/14   Kelvin Cellar, MD  clopidogrel (PLAVIX) 75 MG tablet Take 1 tablet (75 mg total) by mouth daily. 11/26/14    Ripudeep Krystal Eaton, MD  Docusate Sodium (DSS) 100 MG CAPS Take 100 mg by mouth 2 (two) times daily. For constipation 01/08/14   Kelvin Cellar, MD  lactulose (CHRONULAC) 10 GM/15ML solution Take 30 mLs (20 g total) by mouth 2 (two) times daily as needed for moderate constipation. 01/08/14   Kelvin Cellar, MD  levalbuterol (XOPENEX) 0.63 MG/3ML nebulizer solution Take 3 mLs (0.63 mg total) by nebulization every 2 (two) hours as needed for wheezing or shortness of breath. 01/08/14   Kelvin Cellar, MD  lisinopril (PRINIVIL,ZESTRIL) 2.5 MG tablet Take 1 tablet (2.5 mg total) by mouth daily. 11/26/14   Ripudeep Krystal Eaton, MD  metoprolol succinate (TOPROL-XL) 50 MG 24 hr tablet Take 1 tablet (50 mg total) by mouth daily. Take with or immediately following a meal. 11/26/14   Ripudeep K Rai, MD  multivitamin (RENA-VIT) TABS tablet Take 1 tablet by mouth at bedtime. 01/08/14   Kelvin Cellar, MD  nitroGLYCERIN (NITROSTAT) 0.4 MG SL tablet Place 1 tablet (0.4 mg total) under the tongue every 5 (five) minutes as needed for chest pain. 11/26/14   Ripudeep Krystal Eaton, MD  promethazine (PHENERGAN) 12.5 MG suppository Place 12.5 mg rectally every 4 (four) hours as needed for nausea or vomiting.    Historical Provider, MD   BP 136/65 mmHg  Pulse 75  Temp(Src) 98.2 F (36.8 C) (Oral)  Resp 22  SpO2 99% Physical Exam  0925: Physical examination:  Nursing notes reviewed; Vital signs and O2 SAT reviewed;  Constitutional: Well developed, Well nourished, Well hydrated, In no acute distress; Head:  Normocephalic, atraumatic; Eyes: EOMI, PERRL, No scleral icterus; ENMT: Mouth and pharynx normal, Mucous membranes moist; Neck: Supple, Full range of motion, No lymphadenopathy; Cardiovascular: Regular rate and rhythm, No gallop; Respiratory: Breath sounds coarse & equal bilaterally, No wheezes. +weak, moist cough during exam. Normal respiratory effort/excursion; Chest: Nontender, Movement normal; Abdomen: Soft, Nontender, Nondistended,  Normal bowel sounds; Genitourinary: No CVA tenderness; Extremities: Pulses normal, No tenderness, No edema, No calf edema or asymmetry.; Neuro: Awake, alert. Non-verbal per hx. Deaf, per hx. No facial droop. Moves all extremities spontaneously without apparent gross focal motor deficits.; Skin: Color normal, Warm, Dry.   ED Course  Procedures     EKG Interpretation   Date/Time:  Wednesday January 13 2015 09:25:31 EST Ventricular Rate:  80 PR Interval:  164 QRS Duration: 103 QT Interval:  420 QTC Calculation: 484 R Axis:   -2 Text Interpretation:  Sinus rhythm Inferior infarct, old  Anteroseptal  infarct , age undetermined Nonspecific T wave abnormality Lateral leads  Artifact When compared with ECG of 12/12/2014 Anteroseptal infarct is no  longer Present Confirmed by [facility]  MD, Nunzio Cory [phone]) on 01/13/2015  9:57:21 AM      MDM  MDM Reviewed: previous chart, nursing note and vitals Reviewed previous: labs and ECG Interpretation: labs, ECG and x-ray     Results for orders placed or performed during the hospital encounter of [id number]  Basic metabolic panel  Result Value Ref Range   Sodium 133 (L) 135 - 145 mmol/L   Potassium 4.8 3.5 - 5.1 mmol/L   Chloride 93 (L) 96 - 112 mEq/L   CO2 27 19 - 32 mmol/L   Glucose, Bld 111 (H) 70 - 99 mg/dL   BUN 55 (H) 6 - 23 mg/dL   Creatinine, Ser 5.96 (H) 0.50 - 1.10 mg/dL   Calcium 9.1 8.4 - 10.5 mg/dL   GFR calc non Af Amer 7 (L) >90 mL/min   GFR calc Af Amer 8 (L) >90 mL/min   Anion gap 13 5 - 15  CBC with Differential  Result Value Ref Range   WBC 5.6 4.0 - 10.5 K/uL   RBC 3.28 (L) 3.87 - 5.11 MIL/uL   Hemoglobin 10.4 (L) 12.0 - 15.0 g/dL   HCT 31.8 (L) 36.0 - 46.0 %   MCV 97.0 78.0 - 100.0 fL   MCH 31.7 26.0 - 34.0 pg   MCHC 32.7 30.0 - 36.0 g/dL   RDW 15.4 11.5 - 15.5 %   Platelets 258 150 - 400 K/uL   Neutrophils Relative % 55 43 - 77 %   Neutro Abs 3.0 1.7 - 7.7 K/uL   Lymphocytes Relative 25 12 - 46 %   Lymphs  Abs 1.4 0.7 - 4.0 K/uL   Monocytes Relative 14 (H) 3 - 12 %   Monocytes Absolute 0.8 0.1 - 1.0 K/uL   Eosinophils Relative 5 0 - 5 %   Eosinophils Absolute 0.3 0.0 - 0.7 K/uL   Basophils Relative 1 0 - 1 %   Basophils Absolute 0.1 0.0 - 0.1 K/uL  Urinalysis, Routine w reflex microscopic  Result Value Ref Range   Color, Urine AMBER (A) YELLOW   APPearance CLOUDY (A) CLEAR   Specific Gravity, Urine 1.020 1.005 - 1.030   pH 5.0 5.0 - 8.0   Glucose, UA NEGATIVE NEGATIVE mg/dL   Hgb urine dipstick NEGATIVE NEGATIVE   Bilirubin Urine SMALL (A) NEGATIVE   Ketones, ur 15 (A) NEGATIVE mg/dL   Protein, ur 100 (A) NEGATIVE mg/dL   Urobilinogen, UA 0.2 0.0 - 1.0 mg/dL   Nitrite NEGATIVE NEGATIVE   Leukocytes, UA TRACE (A) NEGATIVE  Brain natriuretic peptide  Result Value Ref Range   B Natriuretic Peptide 4164.6 (H) 0.0 - 100.0 pg/mL  Urine microscopic-add on  Result Value Ref Range   Squamous Epithelial / LPF FEW (A) RARE   WBC, UA 3-6 <3 WBC/hpf   RBC / HPF 0-2 <3 RBC/hpf   Bacteria, UA RARE RARE  I-Stat CG4 Lactic Acid, ED  Result Value Ref Range   Lactic Acid, Venous 1.29 0.5 - 2.2 mmol/L  I-stat troponin, ED  Result Value Ref Range   Troponin i, poc 0.01 0.00 - 0.08 ng/mL   Comment 3           Dg Chest Port 1 View 01/13/2015   CLINICAL DATA:  Failure to thrive and fevers  EXAM: PORTABLE CHEST - 1 VIEW  COMPARISON:  12/14/2014  FINDINGS: Cardiac shadow is stable. Increased vascular congestion with mild interstitial edema is now seen. No focal confluent infiltrate is noted.  IMPRESSION: Mild CHF.   Electronically Signed   By: Inez [place] M.D.   On: 01/13/2015 10:21    1235:  CXR with CHF and BNP elevated. BUN/Cr per ESRD on HD. No clear UTI on Udip; UC pending.  T/C to [facility] Labs, case discussed, including:  HPI, pertinent PM/SHx, VS/PE, dx testing, ED course and treatment:  Agreeable to admit, requests to write temporary orders, obtain [facility] x2, tele bed to team  MCAdmits.     Francine Graven, DO 01/16/15 1340

## 2015-01-13 NOTE — Evaluation (Signed)
Physical Therapy Evaluation Patient Details Name: [name] MRN: [id number] DOB: 06-03-1956 Today's Date: 01/13/2015   History of Present Illness  Pt is a 59 y.o. female, with PMH of end-stage renal disease on dialysis (M/W/F), deafness, blindness, diabetes mellitus, anemia, hypertension, hyperlipidemia, who presents from nursing home secondary to poor appetite, not eating or drinking. Patient was noticed to have cough, nonproductive, chest x-ray did not show any opacity or infiltrate, did show mild CHF picture, patient was not hypoxic, not tachypneic, known to have EF of 35%. Admitted for further evaluation.  Clinical Impression  Pt admitted with above diagnosis. Pt currently with functional limitations due to the deficits listed below (see PT Problem List). At the time of PT eval pt was able to perform transfers and ambulation with +2 assist for safety and tactile cueing. Interpreter was present throughout session and assisted in facilitating movement. Upon initial stand pt's knees were very shaky, however appeared to improve with gait training. Pt will benefit from skilled PT to increase their independence and safety with mobility to allow discharge to the venue listed below.       Follow Up Recommendations SNF;Supervision/Assistance - 24 hour    Equipment Recommendations  None recommended by PT    Recommendations for Other Services       Precautions / Restrictions Precautions Precautions: Fall Precaution Comments: Pt is deaf, legally blind, and mute. Can see objects as shadows in the right lighting. Therapist in front of window and waved to pt and pt waved back. Pt is very difficult to understand however appeared to be asking for water, thank you, and I love you.  Restrictions Weight Bearing Restrictions: No      Mobility  Bed Mobility Overal bed mobility: Needs Assistance;+ 2 for safety/equipment Bed Mobility: Supine to Sit;Sit to Supine     Supine to sit: Min assist Sit  to supine: Min assist   General bed mobility comments: Tactile cues to initiate movement, assist to elevate trunk to upright EOB  Transfers Overall transfer level: Needs assistance Equipment used: 2 person hand held assist Transfers: Sit to/from Stand Sit to Stand: Min assist;+2 safety/equipment         General transfer comment: Pt was able to power-up to full standing with tactile cues to initiate, and hands placed on seated surface. Therapist provided support at trunk while interpreter giving cues to stand with hands.   Ambulation/Gait Ambulation/Gait assistance: Min assist Ambulation Distance (Feet): 10 Feet Assistive device: 2 person hand held assist Gait Pattern/deviations: Step-through pattern;Decreased stride length;Shuffle;Trunk flexed Gait velocity: Decreased Gait velocity interpretation: Below normal speed for age/gender General Gait Details: Pt was able to ambulate in room ~10 feet. LE's very shaky, and distance was limited to in-room for safety.   Stairs            Wheelchair Mobility    Modified Rankin (Stroke Patients Only)       Balance Overall balance assessment: Needs assistance Sitting-balance support: Feet supported;No upper extremity supported Sitting balance-Leahy Scale: Fair     Standing balance support: Bilateral upper extremity supported;During functional activity Standing balance-Leahy Scale: Poor Standing balance comment: Pt requires upper extremity support to maintain standing balance.                              Pertinent Vitals/Pain Pain Assessment: Faces Faces Pain Scale: No hurt    Home Living Family/patient expects to be discharged to:: Skilled nursing facility  Additional Comments: Pt at [place] [facility]    Prior Function Level of Independence: Needs assistance   Gait / Transfers Assistance Needed: As recent as 11/2014 pt was ambulating 240 feet with PT and min assist +2.            Hand  Dominance   Dominant Hand: Right    Extremity/Trunk Assessment   Upper Extremity Assessment: Defer to OT evaluation           Lower Extremity Assessment: Generalized weakness      Cervical / Trunk Assessment: Normal  Communication   Communication: Deaf;Interpreter utilized  Cognition Arousal/Alertness: Awake/alert Behavior During Therapy: WFL for tasks assessed/performed Overall Cognitive Status: History of cognitive impairments - at baseline                      General Comments      Exercises        Assessment/Plan    PT Assessment Patient needs continued PT services  PT Diagnosis Difficulty walking;Generalized weakness   PT Problem List Decreased strength;Decreased range of motion;Decreased activity tolerance;Decreased balance;Decreased mobility;Decreased knowledge of use of DME;Decreased safety awareness;Decreased knowledge of precautions  PT Treatment Interventions DME instruction;Gait training;Stair training;Functional mobility training;Therapeutic activities;Therapeutic exercise;Neuromuscular re-education;Patient/family education   PT Goals (Current goals can be found in the Care Plan section) Acute Rehab PT Goals Patient Stated Goal: Pt did not state goals PT Goal Formulation: Patient unable to participate in goal setting Time For Goal Achievement: 01/27/15 Potential to Achieve Goals: Good    Frequency Min 2X/week   Barriers to discharge        Co-evaluation               End of Session Equipment Utilized During Treatment: Gait belt Activity Tolerance: Patient tolerated treatment well Patient left: with call bell/phone within reach;in bed;with bed alarm set;Other (comment) (Interpreter present) Nurse Communication: Mobility status    Functional Assessment Tool Used: Clinical judgement Functional Limitation: Mobility: Walking and moving around Mobility: Walking and Moving Around Current Status [phone]): At least 20 percent but less  than 40 percent impaired, limited or restricted Mobility: Walking and Moving Around Goal Status [phone]): At least 20 percent but less than 40 percent impaired, limited or restricted    Time: 1422-1445 PT Time Calculation (min) (ACUTE ONLY): 23 min   Charges:   PT Evaluation $Initial PT Evaluation Tier I: 1 Procedure PT Treatments $Gait Training: 8-22 mins   PT G Codes:   PT G-Codes **NOT FOR INPATIENT CLASS** Functional Assessment Tool Used: Clinical judgement Functional Limitation: Mobility: Walking and moving around Mobility: Walking and Moving Around Current Status [id number]): At least 20 percent but less than 40 percent impaired, limited or restricted Mobility: Walking and Moving Around Goal Status [phone]): At least 20 percent but less than 40 percent impaired, limited or restricted    Rolinda Roan 01/13/2015, 3:13 PM  Rolinda Roan, PT, DPT Acute Rehabilitation Services Pager: [phone]

## 2015-01-13 NOTE — ED Notes (Signed)
FAMILY UPDATED ON PLAN OF CARE. AND mAURICE CONTACTED ON PT [place]. [phone]

## 2015-01-13 NOTE — Consult Note (Signed)
[facility] Renal Consultation Note  Indication for Consultation:  Management of ESRD/hemodialysis; anemia, hypertension/volume and secondary hyperparathyroidism  HPI: [name] is a 59 y.o. female sent from NH with " Failure to thrive, with poor appetite, not eating or drinking." No reported fevers,sweats, chills, sob,abdominal pain, sob or Chest pain. Unfortunately  communication with Patient is challenging as she is blind, mute, and deaf. In past and at kidney center pt is able to write down her communication wit pad and pen but not doing this in ER now.  She appears alert in ER  slightly more lethargic than baseline. She attended her last Hemodialysis Treatment on time Monday and no reported problems and ate her lunch provided her from the nursing home at the dialysis unit. She has a left AVF used without problems and no sign of infection in it.    Past Medical History  Diagnosis Date  . Hyperlipidemia   . Hypertension     a. Has previously refused blood pressure meds.   . Deaf     Divorced from husband but lives with him. Has daughter but she does not care for her.  . Bilateral leg edema     a. Chronic.  Marland Kitchen Psychiatric disorder     She frequently exhibits paranoia and has been diagnosed with psychotic d/o NOS during hospital stay in the past. She is tangetial and perseverative during her visits. She apparently has had a bad experience with mental health in [place] in the past and refuses to discuss mental issues for fear that she will be sent back there. Her paranoia, communication issues, financial woes and lack of fam  . Fibroid uterus   . Anemia     Due to fibroids.  . Heart murmur   . Diabetes mellitus     a. Per PCP note 2012 (A1C 9.2) - pt unwilling to take meds and was educated on risk of uncontrolled DM.  b. A1C 5.9 in 11/2013.  Marland Kitchen Coronary artery disease   . MI (myocardial infarction)   . Blind   . ESRD on hemodialysis     Mon, Wed, Fri    Past Surgical  History  Procedure Laterality Date  . No past surgeries    . Insertion of dialysis catheter Right 01/02/2014    Procedure: INSERTION OF DIALYSIS CATHETER;  Surgeon: Rosetta Posner, MD;  Location: [facility];  Service: Vascular;  Laterality: Right;  . Av fistula placement Left 01/02/2014    Procedure: ARTERIOVENOUS (AV) FISTULA CREATION;  Surgeon: Rosetta Posner, MD;  Location: [facility] OR;  Service: Vascular;  Laterality: Left;      Family History  Problem Relation Age of Onset  . Other Father     Drowned from fishing?  . Heart disease        reports that she has never smoked. She does not have any smokeless tobacco history on file. She reports that she does not drink alcohol or use illicit drugs.  No Known Allergies  Prior to Admission medications   Medication Sig Start Date End Date Taking? Authorizing Provider  Amino Acids-Protein Hydrolys (FEEDING SUPPLEMENT, PRO-STAT SUGAR FREE 64,) LIQD Take 30 mLs by mouth 3 (three) times daily with meals. 12/21/14  Yes Delfina Redwood, MD  amiodarone (PACERONE) 200 MG tablet Take 200 mg by mouth daily.   Yes Historical Provider, MD  aspirin 81 MG tablet Take 81 mg by mouth daily. For hypertension   Yes Historical Provider, MD  atorvastatin (LIPITOR) 10  MG tablet Take 10 mg by mouth daily.   Yes Historical Provider, MD  calcium acetate (PHOSLO) 667 MG capsule Take 1 capsule (667 mg total) by mouth 3 (three) times daily with meals. 01/08/14  Yes Kelvin Cellar, MD  clopidogrel (PLAVIX) 75 MG tablet Take 1 tablet (75 mg total) by mouth daily. 11/26/14  Yes Ripudeep Krystal Eaton, MD  Docusate Sodium (DSS) 100 MG CAPS Take 100 mg by mouth 2 (two) times daily. For constipation 01/08/14  Yes Kelvin Cellar, MD  lisinopril (PRINIVIL,ZESTRIL) 2.5 MG tablet Take 1 tablet (2.5 mg total) by mouth daily. 11/26/14  Yes Ripudeep Krystal Eaton, MD  metoprolol succinate (TOPROL-XL) 50 MG 24 hr tablet Take 1 tablet (50 mg total) by mouth daily. Take with or immediately following a meal. 11/26/14   Yes Ripudeep K Rai, MD  multivitamin (RENA-VIT) TABS tablet Take 1 tablet by mouth at bedtime. 01/08/14  Yes Kelvin Cellar, MD  nitroGLYCERIN (NITROSTAT) 0.4 MG SL tablet Place 1 tablet (0.4 mg total) under the tongue every 5 (five) minutes as needed for chest pain. 11/26/14   Ripudeep Krystal Eaton, MD    OLM:[id number] **OR** acetaminophen, bisacodyl, lactulose, levalbuterol, nitroGLYCERIN, ondansetron **OR** ondansetron (ZOFRAN) IV  Results for orders placed or performed during the hospital encounter of 01/13/15 (from the past 48 hour(s))  Basic metabolic panel     Status: Abnormal   Collection Time: 01/13/15  9:42 AM  Result Value Ref Range   Sodium 133 (L) 135 - 145 mmol/L    Comment: Please note change in reference range.   Potassium 4.8 3.5 - 5.1 mmol/L    Comment: Please note change in reference range.   Chloride 93 (L) 96 - 112 mEq/L   CO2 27 19 - 32 mmol/L   Glucose, Bld 111 (H) 70 - 99 mg/dL   BUN 55 (H) 6 - 23 mg/dL   Creatinine, Ser 5.96 (H) 0.50 - 1.10 mg/dL   Calcium 9.1 8.4 - 10.5 mg/dL   GFR calc non Af Amer 7 (L) >90 mL/min   GFR calc Af Amer 8 (L) >90 mL/min    Comment: (NOTE) The eGFR has been calculated using the CKD EPI equation. This calculation has not been validated in all clinical situations. eGFR's persistently <90 mL/min signify possible Chronic Kidney Disease.    Anion gap 13 5 - 15  CBC with Differential     Status: Abnormal   Collection Time: 01/13/15  9:42 AM  Result Value Ref Range   WBC 5.6 4.0 - 10.5 K/uL   RBC 3.28 (L) 3.87 - 5.11 MIL/uL   Hemoglobin 10.4 (L) 12.0 - 15.0 g/dL   HCT 31.8 (L) 36.0 - 46.0 %   MCV 97.0 78.0 - 100.0 fL   MCH 31.7 26.0 - 34.0 pg   MCHC 32.7 30.0 - 36.0 g/dL   RDW 15.4 11.5 - 15.5 %   Platelets 258 150 - 400 K/uL   Neutrophils Relative % 55 43 - 77 %   Neutro Abs 3.0 1.7 - 7.7 K/uL   Lymphocytes Relative 25 12 - 46 %   Lymphs Abs 1.4 0.7 - 4.0 K/uL   Monocytes Relative 14 (H) 3 - 12 %   Monocytes Absolute 0.8 0.1  - 1.0 K/uL   Eosinophils Relative 5 0 - 5 %   Eosinophils Absolute 0.3 0.0 - 0.7 K/uL   Basophils Relative 1 0 - 1 %   Basophils Absolute 0.1 0.0 - 0.1 K/uL  Brain natriuretic peptide  Status: Abnormal   Collection Time: 01/13/15  9:42 AM  Result Value Ref Range   B Natriuretic Peptide 4164.6 (H) 0.0 - 100.0 pg/mL    Comment: Please note change in reference range.  Urinalysis, Routine w reflex microscopic     Status: Abnormal   Collection Time: 01/13/15  9:53 AM  Result Value Ref Range   Color, Urine AMBER (A) YELLOW    Comment: BIOCHEMICALS MAY BE AFFECTED BY COLOR   APPearance CLOUDY (A) CLEAR   Specific Gravity, Urine 1.020 1.005 - 1.030   pH 5.0 5.0 - 8.0   Glucose, UA NEGATIVE NEGATIVE mg/dL   Hgb urine dipstick NEGATIVE NEGATIVE   Bilirubin Urine SMALL (A) NEGATIVE   Ketones, ur 15 (A) NEGATIVE mg/dL   Protein, ur 100 (A) NEGATIVE mg/dL   Urobilinogen, UA 0.2 0.0 - 1.0 mg/dL   Nitrite NEGATIVE NEGATIVE   Leukocytes, UA TRACE (A) NEGATIVE  Urine microscopic-add on     Status: Abnormal   Collection Time: 01/13/15  9:53 AM  Result Value Ref Range   Squamous Epithelial / LPF FEW (A) RARE   WBC, UA 3-6 <3 WBC/hpf   RBC / HPF 0-2 <3 RBC/hpf   Bacteria, UA RARE RARE  I-stat troponin, ED     Status: None   Collection Time: 01/13/15 10:01 AM  Result Value Ref Range   Troponin i, poc 0.01 0.00 - 0.08 ng/mL   Comment 3            Comment: Due to the release kinetics of cTnI, a negative result within the first hours of the onset of symptoms does not rule out myocardial infarction with certainty. If myocardial infarction is still suspected, repeat the test at appropriate intervals.   I-Stat CG4 Lactic Acid, ED     Status: None   Collection Time: 01/13/15 10:03 AM  Result Value Ref Range   Lactic Acid, Venous 1.29 0.5 - 2.2 mmol/L  .  ROS:  See hpi  Physical Exam: Filed Vitals:   01/13/15 1330  BP: 150/78  Pulse: 77  Temp:   Resp: 15     General: Elderly BF,  NAD, grunting voice sounds/ slightly more lethargic than her usual self/  HEENT: [place]/ mmdry Neck: no jvd Heart: RRR, no rub, mur, gallop Lungs: CTA  Bilat. , nonlabored breathing Abdomen:  Obese, BS pos. , soft, NT, ND Extremities: No pedal edema Skin: R Gtr toe with ulcer moist/ L grt toe ulcer dry Neuro: Mute, deaf, blind not able to follow any directions  Dialysis Access: ps, bruit L UA AVF  Dialysis Orders: Center: [facility]  on MWF . EDW 80kg HD [place] 2.0k, 2.5 ca  Time 4hrs 77mn  Heparin 9000 . Access LUA AVF     Aranesp 665m q wed hd    Units IV/HD  Venofer  10053m hd  Loading  From op last dose 01/18/15  Other  Op labs 9.7 hgb 01/06/15, 23% tfs.    Ca 9.0  Phos 4.1  pth 202  Assessment/Plan 1. Weakness/ FTT per NH - WU per Admit team/  Started on Levofloxacin / PT consulted/  ?? Image R Foot  2. [facility] 3. Hypertension/volume  -  bp  150/78/  cxr showing some ^ Vol = attempt 3 to 3.5 l uf hd / on low dose Lisonopril 2.5mg23md Metoprolol  xl 50mg60mh a.fib 4. Anemia  - hgb 10.4  ESA  q wed and iron load  5. Metabolic bone disease -  No op vit d/ on phos lo binder 6. Nutrition - Renal carb mod with eating / renal vit  7. DM- per admit 8. HO SVT / Cad/ CM  30-35%- on amiodarone and Toprol  xl , statin , esa  Ernest Haber, PA-C [facility] [phone] 01/13/2015, 2:12 PM      I have seen and examined this patient and agree with plan as outlined by Ernest Haber, PA-C. Difficult situation as she is deaf, mute, and now blind and was not responding as she normally does with writing on her palms.  Will cont with HD while she remains an inpt. Emir Nack A,MD 01/14/2015 9:36 AM

## 2015-01-13 NOTE — Progress Notes (Signed)
ANTIBIOTIC CONSULT NOTE - INITIAL  Pharmacy Consult for levaquin Indication: acute bronchitis  No Known Allergies  Patient Measurements:    Weight: 81.3 kg 12/29/14  Vital Signs: Temp: 98.2 F (36.8 C) (01/20 0920) Temp Source: Oral (01/20 0920) BP: 150/78 mmHg (01/20 1330) Pulse Rate: 77 (01/20 1330) Intake/Output from previous day:   Intake/Output from this shift: Total I/O In: -  Out: 10 [Urine:10]  Labs:  Recent Labs  01/13/15 0942  WBC 5.6  HGB 10.4*  PLT 258  CREATININE 5.96*   CrCl cannot be calculated (Unknown ideal weight.). No results for input(s): VANCOTROUGH, VANCOPEAK, VANCORANDOM, GENTTROUGH, GENTPEAK, GENTRANDOM, TOBRATROUGH, TOBRAPEAK, TOBRARND, AMIKACINPEAK, AMIKACINTROU, AMIKACIN in the last 72 hours.   Microbiology: No results found for this or any previous visit (from the past 720 hour(s)).  Medical History: Past Medical History  Diagnosis Date  . Hyperlipidemia   . Hypertension     a. Has previously refused blood pressure meds.   . Deaf     Divorced from husband but lives with him. Has daughter but she does not care for her.  . Bilateral leg edema     a. Chronic.  Marland Kitchen Psychiatric disorder     She frequently exhibits paranoia and has been diagnosed with psychotic d/o NOS during hospital stay in the past. She is tangetial and perseverative during her visits. She apparently has had a bad experience with mental health in [place] in the past and refuses to discuss mental issues for fear that she will be sent back there. Her paranoia, communication issues, financial woes and lack of fam  . Fibroid uterus   . Anemia     Due to fibroids.  . Heart murmur   . Diabetes mellitus     a. Per PCP note 2012 (A1C 9.2) - pt unwilling to take meds and was educated on risk of uncontrolled DM.  b. A1C 5.9 in 11/2013.  Marland Kitchen Coronary artery disease   . MI (myocardial infarction)   . Blind   . ESRD on hemodialysis     Mon, Wed, Fri    Assessment: 59 yo F from SNF  with fever and report of "not acting like herself".  Pharmacy consulted to dose levaquin for acute bronchitits.  Pt with ESRD on HD MWF, deaf, blind, DM, anemia, HTN, HLD. Afebrile, WBC normal,  nonproductive cough, CXR negative for PNA.   Goal of Therapy:  Treat acute bronchitis  Plan:  -levaquin 750 mg IV x 1 then levaquin 500 mg PO q48hrs to start 1/22  Eudelia Bunch, Pharm.D. BP:[phone] 01/13/2015 1:51 PM

## 2015-01-13 NOTE — ED Notes (Signed)
Dr Meredeth Ide at bedside,

## 2015-01-13 NOTE — H&P (Addendum)
Patient Demographics  [name], is a 59 y.o. female  MRN: [id number]   DOB - [birth year]  Admit Date - 01/13/2015  Outpatient Primary MD for the patient is [name], DO   With History of -  Past Medical History  Diagnosis Date  . Hyperlipidemia   . Hypertension     a. Has previously refused blood pressure meds.   . Deaf     Divorced from husband but lives with him. Has daughter but she does not care for her.  . Bilateral leg edema     a. Chronic.  Marland Kitchen Psychiatric disorder     She frequently exhibits paranoia and has been diagnosed with psychotic d/o NOS during hospital stay in the past. She is tangetial and perseverative during her visits. She apparently has had a bad experience with mental health in [place] [address] [place] in the past and refuses to discuss mental issues for fear that she will be sent back there. Her paranoia, communication issues, financial woes and lack of fam  . Fibroid uterus   . Anemia     Due to fibroids.  . Heart murmur   . Diabetes mellitus     a. Per PCP note 2012 (A1C 9.2) - pt unwilling to take meds and was educated on risk of uncontrolled DM.  b. A1C 5.9 in 11/2013.  Marland Kitchen Coronary artery disease   . MI (myocardial infarction)   . Blind   . ESRD on hemodialysis     Mon, Wed, Fri      Past Surgical History  Procedure Laterality Date  . No past surgeries    . Insertion of dialysis catheter Right 01/02/2014    Procedure: INSERTION OF DIALYSIS CATHETER;  Surgeon: Rosetta Posner, MD;  Location: [facility];  Service: Vascular;  Laterality: Right;  . Av fistula placement Left 01/02/2014    Procedure: ARTERIOVENOUS (AV) FISTULA CREATION;  Surgeon: Rosetta Posner, MD;  Location: [facility];  Service: Vascular;  Laterality: Left;    in for   Chief Complaint  Patient presents with  . Failure To Thrive  . Fever     HPI  [name]  is a 59 y.o. female, with past medical history of end-stage renal disease on dialysis (M/W/F), deafness, blindness, diabetes mellitus, anemia,  hypertension, hyperlipidemia, who presents from nursing home secondary to poor appetite, not eating or drinking. There is no other complaints from nursing home documentation, patient had basic workup done in ED, which did not show any fever, did show acceptable vital signs, blood work was done, without significant leukocytosis, Significant for end-stage renal disease, patient was noticed to have cough, nonproductive, chest x-ray did not show any opacity or infiltrate, did show mild CHF picture, patient was not hypoxic, not tachypneic, known to have EF of 35%. Hospitalist requested to admit the patient for further evaluation.     Review of Systems    Patient is deaf, and mute, can't provide any reliable review of system.could give any specific complaints to interpretor.   Social History History  Substance Use Topics  . Smoking status: Never Smoker   . Smokeless tobacco: Not on file     Comment: Prior dip  . Alcohol Use: No     Family History Family History  Problem Relation Age of Onset  . Other Father     Drowned from fishing?  . Heart disease       Prior to Admission medications   Medication Sig Start Date End Date  Taking? Authorizing Provider  Amino Acids-Protein Hydrolys (FEEDING SUPPLEMENT, PRO-STAT SUGAR FREE 64,) LIQD Take 30 mLs by mouth 3 (three) times daily with meals. 12/21/14  Yes Delfina Redwood, MD  amiodarone (PACERONE) 200 MG tablet Take 200 mg by mouth daily.   Yes Historical Provider, MD  aspirin 81 MG tablet Take 81 mg by mouth daily. For hypertension   Yes Historical Provider, MD  atorvastatin (LIPITOR) 10 MG tablet Take 10 mg by mouth daily.   Yes Historical Provider, MD  calcium acetate (PHOSLO) 667 MG capsule Take 1 capsule (667 mg total) by mouth 3 (three) times daily with meals. 01/08/14  Yes Kelvin Cellar, MD  clopidogrel (PLAVIX) 75 MG tablet Take 1 tablet (75 mg total) by mouth daily. 11/26/14  Yes Ripudeep Krystal Eaton, MD  Docusate Sodium (DSS) 100 MG  CAPS Take 100 mg by mouth 2 (two) times daily. For constipation 01/08/14  Yes Kelvin Cellar, MD  lisinopril (PRINIVIL,ZESTRIL) 2.5 MG tablet Take 1 tablet (2.5 mg total) by mouth daily. 11/26/14  Yes Ripudeep Krystal Eaton, MD  metoprolol succinate (TOPROL-XL) 50 MG 24 hr tablet Take 1 tablet (50 mg total) by mouth daily. Take with or immediately following a meal. 11/26/14  Yes Ripudeep K Rai, MD  multivitamin (RENA-VIT) TABS tablet Take 1 tablet by mouth at bedtime. 01/08/14  Yes Kelvin Cellar, MD  nitroGLYCERIN (NITROSTAT) 0.4 MG SL tablet Place 1 tablet (0.4 mg total) under the tongue every 5 (five) minutes as needed for chest pain. 11/26/14   Ripudeep Krystal Eaton, MD    No Known Allergies  Physical Exam  Vitals  Blood pressure 136/65, pulse 75, temperature 98.2 F (36.8 C), temperature source Oral, resp. rate 22, SpO2 99 %.   1. General elderly  female lying in bed in NAD,    2. patient is nonverbal, deaf, mute  3 . Strength 5/5 all 4 extremities, Sensation intact all 4 extremities, Plantars down going.  4. Ears and Eyes appear Normal, Conjunctivae clear, PERRLA.  Dry  Oral Mucosa.  5. Supple Neck, No JVD, No cervical lymphadenopathy appriciated, No Carotid Bruits.  6. Symmetrical Chest wall movement, Good air movement bilaterally, CTAB.  7. RRR, No Gallops, Rubs or Murmurs, No Parasternal Heave.  8. Positive Bowel Sounds, Abdomen Soft, No tenderness, No organomegaly appriciated,No rebound -guarding or rigidity.  9.  No Cyanosis, Normal Skin Turgor, No Skin Rash or Bruise.  10. Good muscle tone,  joints appear normal , no effusions, Normal ROM.right great toe mils wound.  11. No Palpable Lymph Nodes in Neck or Axillae    Data Review  CBC  Recent Labs Lab 01/13/15 0942  WBC 5.6  HGB 10.4*  HCT 31.8*  PLT 258  MCV 97.0  MCH 31.7  MCHC 32.7  RDW 15.4  LYMPHSABS 1.4  MONOABS 0.8  EOSABS 0.3  BASOSABS 0.1    ------------------------------------------------------------------------------------------------------------------  Chemistries   Recent Labs Lab 01/13/15 0942  NA 133*  K 4.8  CL 93*  CO2 27  GLUCOSE 111*  BUN 55*  CREATININE 5.96*  CALCIUM 9.1   ------------------------------------------------------------------------------------------------------------------ CrCl cannot be calculated (Unknown ideal weight.). ------------------------------------------------------------------------------------------------------------------ No results for input(s): TSH, T4TOTAL, T3FREE, THYROIDAB in the last 72 hours.  Invalid input(s): FREET3   Coagulation profile No results for input(s): INR, PROTIME in the last 168 hours. ------------------------------------------------------------------------------------------------------------------- No results for input(s): DDIMER in the last 72 hours. -------------------------------------------------------------------------------------------------------------------  Cardiac Enzymes No results for input(s): CKMB, TROPONINI, MYOGLOBIN in the last 168 hours.  Invalid input(s): CK ------------------------------------------------------------------------------------------------------------------  Invalid input(s): POCBNP   ---------------------------------------------------------------------------------------------------------------  Urinalysis    Component Value Date/Time   COLORURINE AMBER* 01/13/2015 0953   APPEARANCEUR CLOUDY* 01/13/2015 0953   LABSPEC 1.020 01/13/2015 0953   PHURINE 5.0 01/13/2015 [place] 01/13/2015 0953   HGBUR NEGATIVE 01/13/2015 0953   BILIRUBINUR SMALL* 01/13/2015 0953   KETONESUR 15* 01/13/2015 0953   PROTEINUR 100* 01/13/2015 0953   UROBILINOGEN 0.2 01/13/2015 0953   NITRITE NEGATIVE 01/13/2015 0953   LEUKOCYTESUR TRACE* 01/13/2015 0953     ----------------------------------------------------------------------------------------------------------------  Imaging results:   Dg Chest Port 1 View  01/13/2015   CLINICAL DATA:  Failure to thrive and fevers  EXAM: PORTABLE CHEST - 1 VIEW  COMPARISON:  12/14/2014  FINDINGS: Cardiac shadow is stable. Increased vascular congestion with mild interstitial edema is now seen. No focal confluent infiltrate is noted.  IMPRESSION: Mild CHF.   Electronically Signed   By: Inez Catalina M.D.   On: 01/13/2015 10:21    My personal review of EKG: Rhythm NSR, Rate  80 /min, QTc 484 , no Acute ST changes    Assessment & Plan  Principal Problem:   Weakness Active Problems:   HLD (hyperlipidemia)   Deaf mutism, congenital   SVT (supraventricular tachycardia)   Cardiomyopathy, ischemic-EF 30-35%   End stage renal disease on dialysis   Weakness  - Patient presents with poor appetite, and decreased oral intake, unclear etiology, translator at bedside, patient can't give any specific complaints , she is noticed to have significant cough, nonproductive, , will start her on levofloxacin for acute bronchitis , also blood cultures and follow on the results , meanwhile patient is a febrile, no leukocytosis, vital signs are acceptable , will check Flu panel,, start on mucinex and chest PT. - PT consult - will check xay right foot to evaluate wound on right great Toe, doesn't look infected.]   End-stage renal disease  - Consulted nephrology to resume patient on hemodialysis    hyperlipidemia - Continue with statin  History of cardiomyopathy ischemic EF 30/35%, and history of V. tach - Continue with amiodarone, metoprolol, lisinopril  Coronary artery disease - continue with aspirin, Plavix, statin, ACEI, and Toprol      DVT Prophylaxis Heparin   AM Labs Ordered, also please review Full Orders  Family Communication: Admission, patients condition and plan of care including tests being  ordered have been discussed with the son over the phone who indicate understanding and agree with the plan and Code Status.  Code Status Full  Likely DC  back to SNF once stable  Condition GUARDED    Time spent in minutes : 60 minutes    ELGERGAWY, DAWOOD M.D on 01/13/2015 at 1:35 PM  Between 7am to 7pm - Pager - [phone]  After 7pm go to [email or web address] - password [email or web address]  And look for the night coverage person covering me after hours  [facility] Office  [phone]   **Disclaimer: This note may have been dictated with voice recognition software. Similar sounding words can inadvertently be transcribed and this note may contain transcription errors which may not have been corrected upon publication of note.**

## 2015-01-14 ENCOUNTER — Observation Stay (HOSPITAL_COMMUNITY): Payer: Medicare Other

## 2015-01-14 DIAGNOSIS — J209 Acute bronchitis, unspecified: Secondary | ICD-10-CM | POA: Diagnosis not present

## 2015-01-14 DIAGNOSIS — R627 Adult failure to thrive: Secondary | ICD-10-CM | POA: Diagnosis present

## 2015-01-14 DIAGNOSIS — H913 Deaf nonspeaking, not elsewhere classified: Secondary | ICD-10-CM

## 2015-01-14 DIAGNOSIS — Z992 Dependence on renal dialysis: Secondary | ICD-10-CM | POA: Diagnosis not present

## 2015-01-14 DIAGNOSIS — J189 Pneumonia, unspecified organism: Secondary | ICD-10-CM | POA: Diagnosis not present

## 2015-01-14 DIAGNOSIS — L97509 Non-pressure chronic ulcer of other part of unspecified foot with unspecified severity: Secondary | ICD-10-CM | POA: Diagnosis present

## 2015-01-14 DIAGNOSIS — L03031 Cellulitis of right toe: Secondary | ICD-10-CM | POA: Diagnosis not present

## 2015-01-14 DIAGNOSIS — N186 End stage renal disease: Secondary | ICD-10-CM | POA: Diagnosis not present

## 2015-01-14 DIAGNOSIS — I517 Cardiomegaly: Secondary | ICD-10-CM | POA: Diagnosis not present

## 2015-01-14 DIAGNOSIS — J811 Chronic pulmonary edema: Secondary | ICD-10-CM | POA: Diagnosis not present

## 2015-01-14 LAB — URINE CULTURE
Colony Count: NO GROWTH
Culture: NO GROWTH

## 2015-01-14 LAB — BASIC METABOLIC PANEL
Anion gap: 10 (ref 5–15)
BUN: 22 mg/dL (ref 6–23)
CO2: 29 mmol/L (ref 19–32)
Calcium: 8.7 mg/dL (ref 8.4–10.5)
Chloride: 96 mEq/L (ref 96–112)
Creatinine, Ser: 3.89 mg/dL — ABNORMAL HIGH (ref 0.50–1.10)
GFR calc Af Amer: 14 mL/min — ABNORMAL LOW (ref >90)
GFR calc non Af Amer: 12 mL/min — ABNORMAL LOW (ref >90)
Glucose, Bld: 91 mg/dL (ref 70–99)
Potassium: 3.9 mmol/L (ref 3.5–5.1)
Sodium: 135 mmol/L (ref 135–145)

## 2015-01-14 LAB — CBC
HCT: 31.8 % — ABNORMAL LOW (ref 36.0–46.0)
Hemoglobin: 10.3 g/dL — ABNORMAL LOW (ref 12.0–15.0)
MCH: 32.3 pg (ref 26.0–34.0)
MCHC: 32.4 g/dL (ref 30.0–36.0)
MCV: 99.7 fL (ref 78.0–100.0)
Platelets: 229 10*3/uL (ref 150–400)
RBC: 3.19 MIL/uL — ABNORMAL LOW (ref 3.87–5.11)
RDW: 15.3 % (ref 11.5–15.5)
WBC: 3.8 10*3/uL — ABNORMAL LOW (ref 4.0–10.5)

## 2015-01-14 LAB — INFLUENZA PANEL BY PCR (TYPE A & B)
H1N1 flu by pcr: NOT DETECTED
Influenza A By PCR: NEGATIVE
Influenza B By PCR: NEGATIVE

## 2015-01-14 MED ORDER — CEFEPIME HCL 2 G IJ SOLR
2.0000 g | INTRAMUSCULAR | Status: DC
  Filled 2015-01-14: qty 2

## 2015-01-14 MED ORDER — DEXTROSE 5 % IV SOLN
1.0000 g | Freq: Once | INTRAVENOUS | Status: AC
  Administered 2015-01-14: 1 g via INTRAVENOUS
  Filled 2015-01-14: qty 1

## 2015-01-14 NOTE — Progress Notes (Signed)
Subjective:  Pleasant demeanor with Aide assisting pt  With breakfast and appears eating 100% so far/ HD yest on schedule  Objective Vital signs in last 24 hours: Filed Vitals:   01/13/15 2230 01/13/15 2258 01/13/15 2343 01/14/15 0546  BP: 130/67 139/69 144/62 119/59  Pulse: 76 79 83 73  Temp:  98 F (36.7 C)  99.2 F (37.3 C)  TempSrc:  Axillary Oral Oral  Resp:  15 18 16   Weight:  80.6 kg (177 lb 11.1 oz)    SpO2:  99% 100% 98%   Weight change:   Physical Exam: General: Elderly BF, NAD, grunting voice sounds/APPEARS Nl MS to me this am Heart: RRR, no rub, mur, gallop Lungs: CTA Bilat. , nonlabored breathing Abdomen: Obese, BS pos. , soft, NT, ND Extremities: No pedal edema Skin: R Gtr toe with ulcer moist/ L grt toe ulcer dry Dialysis Access: ps, bruit L UA AVF  Dialysis Orders: Center: [facility] on MWF . EDW 80kg HD Bath 2.0k, 2.5 ca Time 4hrs 81min Heparin 9000 . Access LUA AVF  Aranesp 82mcg q wed hd Units IV/HD Venofer 100mg  q hd Loading From op last dose 01/18/15  Other Op labs 9.7 hgb 01/06/15, 23% tfs. Ca 9.0 Phos 4.1 pth 202  Problem/Plan: 1. Weakness/ FTT per NH - WU per Admit team/ Started on Levofloxacin / PT consulted/ plain film R Foot No osteom. Per plain film view 2. ESRD - HD MWF [facility]) on schedule k 3.9 3. Hypertension/volume - bp 119/59 cxr showing some mild interstitial  edema  / hd yest  3.5 l uf to 80.6  Near edw/on low dose Lisonopril 2.5mg  and Metoprolol xl 50mg  with a.fib/ attempt 3 l uf in am if still here 4. Anemia - hgb 10.4 >10.3 ESA q wed and iron load  5. Metabolic bone disease - No op vit d/ on phos lo binder Ca 8.1  6. Nutrition - Renal carb mod with eating / renal vit  7. DM- per admit 8. HO SVT / Cad/ CM 30-35%- on amiodarone and Toprol xl , statin , esa   Ernest Haber, PA-C [place] [phone] 01/14/2015,8:14 AM  LOS: 1 day   Labs: Basic Metabolic Panel:  Recent Labs Lab  01/13/15 0942 01/13/15 1540 01/14/15 0514  NA 133*  --  135  K 4.8  --  3.9  CL 93*  --  96  CO2 27  --  29  GLUCOSE 111*  --  91  BUN 55*  --  22  CREATININE 5.96* 6.39* 3.89*  CALCIUM 9.1  --  8.7    Recent Labs Lab 01/13/15 0942 01/13/15 1540 01/14/15 0514  WBC 5.6 5.0 3.8*  NEUTROABS 3.0  --   --   HGB 10.4* 10.7* 10.3*  HCT 31.8* 32.6* 31.8*  MCV 97.0 96.7 99.7  PLT 258 251 229    Studies/Results: Dg Chest Port 1 View  01/13/2015   CLINICAL DATA:  Failure to thrive and fevers  EXAM: PORTABLE CHEST - 1 VIEW  COMPARISON:  12/14/2014  FINDINGS: Cardiac shadow is stable. Increased vascular congestion with mild interstitial edema is now seen. No focal confluent infiltrate is noted.  IMPRESSION: Mild CHF.   Electronically Signed   By: Inez Catalina M.D.   On: 01/13/2015 10:21   Dg Foot 2 Views Right  01/14/2015   CLINICAL DATA:  Cellulitis right great toe.  Diabetes.  EXAM: RIGHT FOOT - 2 VIEW  COMPARISON:  01/08/2010.  FINDINGS: No  acute soft tissue or bony abnormality. Severe degenerative changes noted about the ankle and foot projected about the calcaneal cuboid and talonavicular joints. Periarticular lucencies are noted about the right fifth metatarsophalangeal joint. Clinical correlation suggested to exclude an inflammatory or infectious arthropathy at this joint. No erosive changes noted about the right great toe. If osteomyelitis remains clinical concern, MRI can be obtained .  IMPRESSION: 1.  Diffuse degenerative changes.  2. Periarticular lucencies noted about the right fifth metatarsal joint. An inflammatory or infectious arthropathy cannot be excluded. Clinical correlation suggested. No erosive changes or evidence of osteomyelitis noted about the right great toe in region of clinical concern. If osteomyelitis or septic arthritis remains of clinical concern MRI can be obtained .   Electronically Signed   By: Marcello Moores  Register   On: 01/14/2015 07:38   Medications:   .  amiodarone  200 mg Oral Daily  . aspirin  81 mg Oral Daily  . atorvastatin  10 mg Oral Daily  . calcium acetate  667 mg Oral TID WC  . clopidogrel  75 mg Oral Daily  . [START ON 01/20/2015] darbepoetin (ARANESP) injection - DIALYSIS  60 mcg Intravenous Q Wed-HD  . docusate sodium  100 mg Oral BID  . feeding supplement (PRO-STAT SUGAR FREE 64)  30 mL Oral TID WC  . [START ON 01/20/2015] ferric gluconate (FERRLECIT/NULECIT) IV  62.5 mg Intravenous Q Wed-HD  . guaiFENesin  600 mg Oral BID  . heparin  5,000 Units Subcutaneous 3 times per day  . [START ON 01/15/2015] levofloxacin  500 mg Oral Q48H  . lisinopril  2.5 mg Oral Daily  . metoprolol succinate  50 mg Oral Daily  . multivitamin  1 tablet Oral QHS   I have seen and examined this patient and agree with plan as outlined by Ernest Haber, PA-C.  Still unclear what the SNF was referring to as she appears to be at her baseline. Cristy Colmenares A,MD 01/14/2015 11:47 AM

## 2015-01-14 NOTE — Consult Note (Addendum)
WOC wound consult note Reason for Consult: Consult requested for buttocks/sacrum and right toe. Wound type: Sacrum/buttocks wound  is technically in the gluteal cleft and is a partial thickness fissure R/T constant moisture.  1X.1X,1cm, Moist white wound bed, no odor or drainage.  Pt is frequently incontinent of stool and a dressing would trap stool against skin.   Pressure Ulcer POA: This is NOT a pressure ulcer Right great toe with chronic full thickness wound; .5X1X.2cm, 50% yellow, 50% red, bone palpable, no odor, mod amt yellow drainage. Dressing procedure/placement/frequency: Aquacel to absorb drainage and provide antimicrobial benefits to right toe.  Barrier cream to repel moisture and protect skin to gluteal fold. Please re-consult if further assistance is needed.  Thank-you,  Julien Girt MSN, [place], [place], [place], [place]

## 2015-01-14 NOTE — Progress Notes (Signed)
Called to give pt's son updates on pt's progress and care. Informed son that pt did eat all of her breakfast but would not accept meds nor would she eat lunch. Asked pt's son if he had any suggestions for staff on how to better communicate with pt. Per the son, it will be better to show staff how he communicates with his mother instead of trying to explain over the phone. Mr. Wagers states he will come see his mother later this afternoon; informed him that staff would be more than happy to meet with him.

## 2015-01-14 NOTE — Progress Notes (Signed)
ANTIBIOTIC CONSULT NOTE - FOLLOW UP  Pharmacy Consult for Levaquin >> Cefepime Indication: Bronchitis, r/o PNA  No Known Allergies  Patient Measurements: Weight: 177 lb 11.1 oz (80.6 kg)  Vital Signs: Temp: 99.2 F (37.3 C) (01/21 0546) Temp Source: Oral (01/21 0546) BP: 119/59 mmHg (01/21 0546) Pulse Rate: 73 (01/21 0546) Intake/Output from previous day: 01/20 0701 - 01/21 0700 In: -  Out: 3510 [Urine:10] Intake/Output from this shift:    Labs:  Recent Labs  01/13/15 0942 01/13/15 1540 01/14/15 0514  WBC 5.6 5.0 3.8*  HGB 10.4* 10.7* 10.3*  PLT 258 251 229  CREATININE 5.96* 6.39* 3.89*   Estimated Creatinine Clearance: 17.6 mL/min (by C-G formula based on Cr of 3.89). No results for input(s): VANCOTROUGH, VANCOPEAK, VANCORANDOM, GENTTROUGH, GENTPEAK, GENTRANDOM, TOBRATROUGH, TOBRAPEAK, TOBRARND, AMIKACINPEAK, AMIKACINTROU, AMIKACIN in the last 72 hours.   Microbiology: No results found for this or any previous visit (from the past 720 hour(s)).  Anti-infectives    Start     Dose/Rate Route Frequency Ordered Stop   01/15/15 1800  ceFEPIme (MAXIPIME) 2 g in dextrose 5 % 50 mL IVPB     2 g100 mL/hr over 30 Minutes Intravenous Every M-W-F (1800) 01/14/15 1019     01/15/15 1400  levofloxacin (LEVAQUIN) tablet 500 mg  Status:  Discontinued     500 mg Oral Every 48 hours 01/13/15 1349 01/14/15 1009   01/14/15 1100  ceFEPIme (MAXIPIME) 1 g in dextrose 5 % 50 mL IVPB     1 g100 mL/hr over 30 Minutes Intravenous  Once 01/14/15 1019     01/13/15 1400  levofloxacin (LEVAQUIN) IVPB 750 mg     750 mg100 mL/hr over 90 Minutes Intravenous  Once 01/13/15 1349 01/13/15 1732      Assessment: 34 YOF on Levaquin for empiric bronchitis and r/o PNA coverage with noted runs of Vtach and elevated QTc (484 on 1/20) - pharmacy consulted to transition the patient off on Levaquin and onto Cefepime for empiric coverage. Pt with ESRD - last HD on 1/20, next planned for 1/22. Cultures still  pending  LVQ 1/20 >> 1/21 Cefepime 1/21 >>  1/20 BCx >> 1/20 UCx >>  Goal of Therapy:  Proper antibiotics for infection/cultures adjusted for renal/hepatic function   Plan:  1. Cefepime 1g IV x 1 dose now 2. Cefepime 2g post HD sessions on M/W/F 3. Will continue to follow HD schedule/duration, culture results, LOT, and antibiotic de-escalation plans   Alycia Rossetti, PharmD, BCPS Clinical Pharmacist Pager: [phone] 01/14/2015 10:22 AM

## 2015-01-14 NOTE — Progress Notes (Signed)
Pt sleeping at this time. RT will continue therapy in the morning.

## 2015-01-14 NOTE — Progress Notes (Signed)
UR completed 

## 2015-01-14 NOTE — Clinical Social Work Psychosocial (Signed)
Clinical Social Work Department BRIEF PSYCHOSOCIAL ASSESSMENT 01/14/2015  Patient:  [name], [name]     Account Number:  [id number]     Admit date:  01/13/2015  Clinical Social Worker:  Gwenevere Abbot,  Date/Time:  01/14/2015 12:00 M  Referred by:  Physician  Date Referred:  01/14/2015 Referred for  SNF Placement   Other Referral:   Interview type:   Other interview type:    PSYCHOSOCIAL DATA Living Status:  FACILITY Admitted from facility:  [place], [place] Level of care:  [place] Primary support name:  Sherronda Waddell Primary support relationship to patient:  CHILD, ADULT Degree of support available:   Shanira Guster  Son  [phone]    CURRENT CONCERNS  Other Concerns:    SOCIAL WORK ASSESSMENT / PLAN CSW-Intern spoke with patient's son Chantella Avara confirming that his mother Verble Peeks will be returning to [facility] when ready for discharge. He expressed that he is overwhelmed because both his mother and father are placed in nursing homes. [name] stated that he doesn't receive much help from his sister and is appreciative that he is being contacted and provided with updates regarding his mother from hospital staff. Son informed that he would be contacted when his mother is ready for discharge. Mr.Ebersole requested that the nurse call him regarding his mother's condition.CSW-Intern assured him that the nurse would be notified.   Assessment/plan status:  No Further Intervention Required Other assessment/ plan:   Information/referral to community resources:   None needed or requested at this time.    PATIENT'S/FAMILY'S RESPONSE TO PLAN OF CARE: The patient's son [name] was overwhelmed and responsive when speaking to CSW-Intern.

## 2015-01-14 NOTE — Progress Notes (Signed)
Pt tolerated CPT manually fairly well. She did not understand what I was doing and seemed to get a little agitated at times. If you hold Pt hand during therapy she tends to do better with it.  No cough present at this time.

## 2015-01-14 NOTE — Progress Notes (Signed)
Attempted multiple times to administer pt's medication but pt refused. Tried giving pt pills whole with water but pt dropped pills out of her hand and did not allow writer to put pill in her mouth. Crushed pills in applesauce and attempted to give them to her that way but again put would not open her mouth. Was able to give pt plain applesauce but pt spit the applesauce back out. Will attempt medication administration again later.

## 2015-01-14 NOTE — Progress Notes (Signed)
[name] APP Team - Progress Note  [name] [id number] DOB: May 15, 1956 DOA: 01/13/2015 PCP: [name], DO   Brief narrative: 59 year old female patient on chronic kidney disease on dialysis, hypertension, anterior wall MI and anemia. She has been deaf and blind since childhood. Patient had recent admission in December 2015 after being found pulseless and apneic after dialysis treatment at her nursing facility. After arrival to the ER she had developed ventricular tachycardia and later workup revealed patient had ischemic cardio myopathy with an EF of around 30%. During that hospitalization cardiology had documented that she was not a candidate for any ICD and recommended DO NOT RESUSCITATE status. Palliative medicine was also consulted during the admission and had an extensive discussion with family; unfortunately son was not ready to make patient a DO NOT RESUSCITATE or comfort care that time. He did verbalize understanding that his mother had a guarded prognosis. Patient was started on amiodarone for her recurrent ventricular tachycardia and was discharged on this medication.  She returned to the ER from the nursing facility due to issues related to poor appetite, suspected dehydration and poor oral intake. In the ER she was afebrile but was hemodynamically stable. She did not have any leukocytosis. She was noticed to have a significant nonproductive cough but chest x-ray did not show opacity or infiltrate although did have some interstitial changes consistent with mild CHF. She was not hypoxic she was not tachypneic. Review of outpatient cardiology notes reveal that on 1/5 during that visit she was maintaining normal sinus rhythm and her amiodarone dosage was to be decreased from 400 to 200 mg daily on January 13th.  HPI/Subjective: Patient awakens when repositioned in the bed but otherwise given underlying congenital problems and is unable to talk, here or see. Limited history or  interaction with patient because of this.  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  -Continues to maintain sinus rhythm-continue amiodarone-previously deemed not a candidate for AICD    CKD (chronic kidney disease) stage V requiring chronic dialysis -Nephrology following    Suspected Acute bronchitis -Based on presentation with cough-no acute infiltrates on chest x-ray-follow-up on influenza panel-given history of ventricular tachycardia and QTC greater than 480 we will discontinue Levaquin in favor of cefepime    Essential hypertension -Blood pressure controlled-continue lisinopril and metoprolol    Diabetes mellitus  -Diet controlled-hemoglobin A1c 6.0 during last admission    Ulcer of right great toe -Non-stage of bowel ulcer right great toe-appreciate WOC RN assistance; appropriate wound care ordered    HLD (hyperlipidemia)    Deaf mutism, congenital    Anemia in CKD (chronic kidney disease)   DVT prophylaxis: Subcutaneous heparin Code Status: Full code Family Communication: No family at bedside Disposition Plan/Expected LOS: Telemetry   Consultants: Nephrology  Procedures: None  Cultures: Blood cultures 2 pending Culture pending  Antibiotics: Levaquin 1/20 > 1/21 Cefepime 1/21 >  Objective: Blood pressure 119/59, pulse 73, temperature 99.2 F (37.3 C), temperature source Oral, resp. rate 16, weight 177 lb 11.1 oz (80.6 kg), SpO2 98 %.  Intake/Output Summary (Last 24 hours) at 01/14/15 [id number] Last data filed at 01/13/15 2258  Gross per 24 hour  Intake      0 ml  Output   3510 ml  Net  -3510 ml     Exam: Gen: No acute respiratory distress Chest: Bilateral lungs sounds very diminished throughout due to poor inspiratory effort but no obvious crackles or rhonchi, room  air Cardiac: Regular rate and rhythm, S1-S2, no rubs murmurs or gallops, no peripheral edema, no JVD Abdomen: Soft nontender  nondistended without obvious hepatosplenomegaly, no ascites Extremities: Symmetrical in appearance without cyanosis, clubbing or effusion; quarter-sized and moist ulcer on dorsum great toe with 100% fibrin base  Scheduled Meds:  Scheduled Meds: . amiodarone  200 mg Oral Daily  . aspirin  81 mg Oral Daily  . atorvastatin  10 mg Oral Daily  . calcium acetate  667 mg Oral TID WC  . clopidogrel  75 mg Oral Daily  . [START ON 01/20/2015] darbepoetin (ARANESP) injection - DIALYSIS  60 mcg Intravenous Q Wed-HD  . docusate sodium  100 mg Oral BID  . feeding supplement (PRO-STAT SUGAR FREE 64)  30 mL Oral TID WC  . [START ON 01/20/2015] ferric gluconate (FERRLECIT/NULECIT) IV  62.5 mg Intravenous Q Wed-HD  . guaiFENesin  600 mg Oral BID  . heparin  5,000 Units Subcutaneous 3 times per day  . [START ON 01/15/2015] levofloxacin  500 mg Oral Q48H  . lisinopril  2.5 mg Oral Daily  . metoprolol succinate  50 mg Oral Daily  . multivitamin  1 tablet Oral QHS   Continuous Infusions:   Data Reviewed: Basic Metabolic Panel:  Recent Labs Lab 01/13/15 0942 01/13/15 1540 01/14/15 0514  NA 133*  --  135  K 4.8  --  3.9  CL 93*  --  96  CO2 27  --  29  GLUCOSE 111*  --  91  BUN 55*  --  22  CREATININE 5.96* 6.39* 3.89*  CALCIUM 9.1  --  8.7   Liver Function Tests: No results for input(s): AST, ALT, ALKPHOS, BILITOT, PROT, ALBUMIN in the last 168 hours. No results for input(s): LIPASE, AMYLASE in the last 168 hours. No results for input(s): AMMONIA in the last 168 hours. CBC:  Recent Labs Lab 01/13/15 0942 01/13/15 1540 01/14/15 0514  WBC 5.6 5.0 3.8*  NEUTROABS 3.0  --   --   HGB 10.4* 10.7* 10.3*  HCT 31.8* 32.6* 31.8*  MCV 97.0 96.7 99.7  PLT 258 251 229   Cardiac Enzymes: No results for input(s): CKTOTAL, CKMB, CKMBINDEX, TROPONINI in the last 168 hours. BNP (last 3 results)  Recent Labs  11/19/14 2335 12/12/14 0314  PROBNP 19448.0* 24864.0*   CBG: No results for  input(s): GLUCAP in the last 168 hours.  No results found for this or any previous visit (from the past 240 hour(s)).   Studies:  Recent x-ray studies have been reviewed in detail by the Attending Physician  Time spent :      Erin Hearing, [place] [facility]  [phone] Pager [phone]   **If unable to reach the above provider after paging please contact the [place] @ [phone]  On-Call/Text Page:      [email or web address]      password [email or web address]  If 7PM-7AM, please contact night-coverage [email or web address] Password [email or web address] 01/14/2015, 9:27 AM   LOS: 1 day   Addendum  I  personally evaluated patient on 01/14/2015 and agree with the above findings. She was admitted on 01/13/2015 presenting with functional decline/failure to thrive. On exam she had clear lungs.  She has a history of end-stage renal disease for which nephrology was consulted to continue hemodialysis during this hospitalization. Workup included a chest x-ray which showed evidence of mild CHF however there is no focal infiltrates noted. There is suspicion that underlying infectious process may have precipitated functional decline. It is possible  she may be developing PNA/Acute bacterial bronchitis. Will continue emperic AB therapy and repeat CXR in am.

## 2015-01-15 DIAGNOSIS — N186 End stage renal disease: Secondary | ICD-10-CM | POA: Diagnosis not present

## 2015-01-15 DIAGNOSIS — H913 Deaf nonspeaking, not elsewhere classified: Secondary | ICD-10-CM | POA: Diagnosis not present

## 2015-01-15 DIAGNOSIS — G9340 Encephalopathy, unspecified: Secondary | ICD-10-CM

## 2015-01-15 DIAGNOSIS — Z992 Dependence on renal dialysis: Secondary | ICD-10-CM | POA: Diagnosis not present

## 2015-01-15 DIAGNOSIS — J209 Acute bronchitis, unspecified: Secondary | ICD-10-CM | POA: Diagnosis not present

## 2015-01-15 DIAGNOSIS — E119 Type 2 diabetes mellitus without complications: Secondary | ICD-10-CM | POA: Diagnosis not present

## 2015-01-15 LAB — COMPREHENSIVE METABOLIC PANEL
ALT: 12 U/L (ref 0–35)
AST: 14 U/L (ref 0–37)
Albumin: 2.8 g/dL — ABNORMAL LOW (ref 3.5–5.2)
Alkaline Phosphatase: 65 U/L (ref 39–117)
Anion gap: 11 (ref 5–15)
BUN: 49 mg/dL — ABNORMAL HIGH (ref 6–23)
CO2: 25 mmol/L (ref 19–32)
Calcium: 8.3 mg/dL — ABNORMAL LOW (ref 8.4–10.5)
Chloride: 93 mEq/L — ABNORMAL LOW (ref 96–112)
Creatinine, Ser: 6.03 mg/dL — ABNORMAL HIGH (ref 0.50–1.10)
GFR calc Af Amer: 8 mL/min — ABNORMAL LOW (ref >90)
GFR calc non Af Amer: 7 mL/min — ABNORMAL LOW (ref >90)
Glucose, Bld: 109 mg/dL — ABNORMAL HIGH (ref 70–99)
Potassium: 4.1 mmol/L (ref 3.5–5.1)
Sodium: 129 mmol/L — ABNORMAL LOW (ref 135–145)
Total Bilirubin: 0.5 mg/dL (ref 0.3–1.2)
Total Protein: 6 g/dL (ref 6.0–8.3)

## 2015-01-15 LAB — CBC
HCT: 28.1 % — ABNORMAL LOW (ref 36.0–46.0)
Hemoglobin: 9.1 g/dL — ABNORMAL LOW (ref 12.0–15.0)
MCH: 31 pg (ref 26.0–34.0)
MCHC: 32.4 g/dL (ref 30.0–36.0)
MCV: 95.6 fL (ref 78.0–100.0)
Platelets: 208 10*3/uL (ref 150–400)
RBC: 2.94 MIL/uL — ABNORMAL LOW (ref 3.87–5.11)
RDW: 14.8 % (ref 11.5–15.5)
WBC: 4.7 10*3/uL (ref 4.0–10.5)

## 2015-01-15 MED ORDER — CEFUROXIME AXETIL 500 MG PO TABS
500.0000 mg | ORAL_TABLET | Freq: Every day | ORAL | Status: DC
  Administered 2015-01-15: 500 mg via ORAL
  Filled 2015-01-15 (×2): qty 1

## 2015-01-15 NOTE — Progress Notes (Signed)
[name] APP Team - Progress Note  [facility] [id number] DOB: [birth year] DOA: 01/13/2015 PCP: [name], DO   Brief narrative: 59 year old female patient on chronic kidney disease on dialysis, hypertension, anterior wall MI and anemia. She has been deaf and blind since childhood. Patient had recent admission in December 2015 after being found pulseless and apneic after dialysis treatment at her nursing facility. After arrival to the ER she had developed ventricular tachycardia and later workup revealed patient had ischemic cardio myopathy with an EF of around 30%. During that hospitalization cardiology had documented that she was not a candidate for any ICD and recommended DO NOT RESUSCITATE status. Palliative medicine was also consulted during the admission and had an extensive discussion with family; unfortunately son was not ready to make patient a DO NOT RESUSCITATE or comfort care that time. He did verbalize understanding that his mother had a guarded prognosis. Patient was started on amiodarone for her recurrent ventricular tachycardia and was discharged on this medication.  She returned to the ER from the nursing facility due to issues related to poor appetite, suspected dehydration and poor oral intake. In the ER she was afebrile but was hemodynamically stable. She did not have any leukocytosis. She was noticed to have a significant nonproductive cough but chest x-ray did not show opacity or infiltrate although did have some interstitial changes consistent with mild CHF. She was not hypoxic she was not tachypneic. Review of outpatient cardiology notes reveal that on 1/5 during that visit she was maintaining normal sinus rhythm and her amiodarone dosage was to be decreased from 400 to 200 mg daily on January 13th.  HPI/Subjective: Patient awakens when repositioned in the bed but otherwise given underlying congenital problems and is unable to talk, here or see. Limited history or  interaction with patient because of this.  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  -Continues to maintain sinus rhythm-continue amiodarone-previously deemed not a candidate for AICD-patient's cough may have been more related to moderate volume overload since edematous changes have improved with frequent dialysis this admission    CKD (chronic kidney disease) stage V requiring chronic dialysis -Nephrology following    Suspected Acute bronchitis -Based on presenting symptoms of cough-no acute infiltrates on chest x-ray-follow-up on influenza panel-given history of ventricular tachycardia and QTC greater than 480 we discontinued Levaquin in favor of cefepime; no clear-cut pneumonia so have narrowed antibiotics to Ceftin by mouth-cough may have been more for manifestation of volume overload-bedside RN states patient still coughing   ? FTT -Reported as eating poorly at nursing facility-staff here state patient eating greater than 85% of meals and drinking plenty of fluids but noted patient must be assisted with meals since she has blind    Essential hypertension -Blood pressure controlled-continue lisinopril and metoprolol    Diabetes mellitus  -Diet controlled-hemoglobin A1c 6.0 during last admission    Ulcer of right great toe -Non-stagable ulcer right great toe-appreciate WOC RN assistance; appropriate wound care ordered    HLD (hyperlipidemia)    Deaf mutism, congenital    Anemia in CKD (chronic kidney disease)   DVT prophylaxis: Subcutaneous heparin Code Status: Full code Family Communication: No family at bedside Disposition Plan/Expected LOS: Telemetry-hopefully can discharge in next 48 hours   Consultants: Nephrology  Procedures: None  Cultures: Blood cultures 2 NGTD Urine Culture neg  Antibiotics: Levaquin 1/20 > 1/21 Cefepime 1/21 >  Objective: Blood pressure 125/60, pulse 71, temperature 98 F (  36.7  C), temperature source Oral, resp. rate 16, weight 185 lb 13.6 oz (84.3 kg), SpO2 100 %.  Intake/Output Summary (Last 24 hours) at 01/15/15 1200 Last data filed at 01/15/15 0800  Gross per 24 hour  Intake   1120 ml  Output      2 ml  Net   1118 ml     Exam: Gen: No acute respiratory distress Chest: Bilateral lungs sounds clear to auscultation, room air Cardiac: Regular rate and rhythm, S1-S2, no rubs murmurs or gallops, no peripheral edema, no JVD Abdomen: Soft nontender nondistended without obvious hepatosplenomegaly, no ascites Extremities: Symmetrical in appearance without cyanosis, clubbing or effusion; quarter-sized and moist ulcer on dorsum great toe with 100% fibrin base  Scheduled Meds:  Scheduled Meds: . amiodarone  200 mg Oral Daily  . aspirin  81 mg Oral Daily  . atorvastatin  10 mg Oral Daily  . calcium acetate  667 mg Oral TID WC  . cefUROXime  500 mg Oral QHS  . clopidogrel  75 mg Oral Daily  . [START ON 01/20/2015] darbepoetin (ARANESP) injection - DIALYSIS  60 mcg Intravenous Q Wed-HD  . docusate sodium  100 mg Oral BID  . feeding supplement (PRO-STAT SUGAR FREE 64)  30 mL Oral TID WC  . [START ON 01/20/2015] ferric gluconate (FERRLECIT/NULECIT) IV  62.5 mg Intravenous Q Wed-HD  . guaiFENesin  600 mg Oral BID  . heparin  5,000 Units Subcutaneous 3 times per day  . lisinopril  2.5 mg Oral Daily  . metoprolol succinate  50 mg Oral Daily  . multivitamin  1 tablet Oral QHS   Continuous Infusions:   Data Reviewed: Basic Metabolic Panel:  Recent Labs Lab 01/13/15 0942 01/13/15 1540 01/14/15 0514 01/15/15 0500  NA 133*  --  135 129*  K 4.8  --  3.9 4.1  CL 93*  --  96 93*  CO2 27  --  29 25  GLUCOSE 111*  --  91 109*  BUN 55*  --  22 49*  CREATININE 5.96* 6.39* 3.89* 6.03*  CALCIUM 9.1  --  8.7 8.3*   Liver Function Tests:  Recent Labs Lab 01/15/15 0500  AST 14  ALT 12  ALKPHOS 65  BILITOT 0.5  PROT 6.0  ALBUMIN 2.8*   No results for  input(s): LIPASE, AMYLASE in the last 168 hours. No results for input(s): AMMONIA in the last 168 hours. CBC:  Recent Labs Lab 01/13/15 0942 01/13/15 1540 01/14/15 0514 01/15/15 0500  WBC 5.6 5.0 3.8* 4.7  NEUTROABS 3.0  --   --   --   HGB 10.4* 10.7* 10.3* 9.1*  HCT 31.8* 32.6* 31.8* 28.1*  MCV 97.0 96.7 99.7 95.6  PLT 258 251 229 208   Cardiac Enzymes: No results for input(s): CKTOTAL, CKMB, CKMBINDEX, TROPONINI in the last 168 hours. BNP (last 3 results)  Recent Labs  11/19/14 2335 12/12/14 0314  PROBNP 19448.0* 24864.0*   CBG: No results for input(s): GLUCAP in the last 168 hours.  Recent Results (from the past 240 hour(s))  Urine culture     Status: None   Collection Time: 01/13/15  9:53 AM  Result Value Ref Range Status   Specimen Description URINE, CLEAN CATCH  Final   Special Requests NONE  Final   Colony Count NO GROWTH Performed at [facility]   Final   Culture NO GROWTH Performed at [facility]   Final   Report Status 01/14/2015 FINAL  Final  Blood culture (routine x 2)     Status: None (Preliminary result)   Collection Time: 01/13/15  3:40 PM  Result Value Ref Range Status   Specimen Description BLOOD RIGHT ARM  Final   Special Requests BOTTLES DRAWN AEROBIC ONLY 10CC  Final   Culture   Final           BLOOD CULTURE RECEIVED NO GROWTH TO DATE CULTURE WILL BE HELD FOR 5 DAYS BEFORE ISSUING A FINAL NEGATIVE REPORT Performed at [facility]    Report Status PENDING  Incomplete  Blood culture (routine x 2)     Status: None (Preliminary result)   Collection Time: 01/13/15  3:50 PM  Result Value Ref Range Status   Specimen Description BLOOD RIGHT HAND  Final   Special Requests BOTTLES DRAWN AEROBIC ONLY 10CC  Final   Culture   Final           BLOOD CULTURE RECEIVED NO GROWTH TO DATE CULTURE WILL BE HELD FOR 5 DAYS BEFORE ISSUING A FINAL NEGATIVE REPORT Performed at [facility]    Report Status PENDING  Incomplete       Studies:  Recent x-ray studies have been reviewed in detail by the Attending Physician  Time spent :      Erin Hearing, ANP [facility] Office  [phone] Pager [phone]   **If unable to reach the above provider after paging please contact the [facility] @ [phone]  On-Call/Text Page:      [email or web address]      password [email or web address]  If 7PM-7AM, please contact night-coverage [email or web address] Password [email or web address] 01/15/2015, 12:00 PM   LOS: 2 days     Addendum  I personally saw and evaluated patient on 01/15/2015. Agree with above findings. Patient is a pleasant 59 year old female with a past medical history of end-stage renal disease on hemodialysis who was admitted for functional decline. She had cough as it was initially suspected this could be related to developing pneumonia. On 01/14/2015 patient was refusing by mouth meds as well as her lunch and dinner, as documented by her nurse. A repeat chest x-ray performed on 01/14/2015 did show improved pulmonary edema however nonspecific bibasilar opacities could reflect atelectasis or pneumonia. She seemed hemodynamically stable by 01/15/2015, had not spiked a fever for which IV antimicrobial therapy was stopped and she was transitioned to oral Ceftin 500 mg every daily. I plan to monitor her for the next 24 hours to ensure that she will take her oral medications, discharge her back to skilled nursing facility in a.m. if she remains stable.

## 2015-01-15 NOTE — Progress Notes (Signed)
Pt sleeping at this time. RT will continue CPT in the morning.

## 2015-01-15 NOTE — Progress Notes (Signed)
UR completed 

## 2015-01-15 NOTE — Progress Notes (Signed)
Physical Therapy Treatment Patient Details Name: [name] MRN: [id number] DOB: 22-Mar-1956 Today's Date: 01/15/2015    History of Present Illness Pt is a 59 y.o. female, with PMH of end-stage renal disease on dialysis (M/W/F), deafness, blindness, diabetes mellitus, anemia, hypertension, hyperlipidemia, who presents from nursing home secondary to poor appetite, not eating or drinking. Patient was noticed to have cough, nonproductive, chest x-ray did not show any opacity or infiltrate, did show mild CHF picture, patient was not hypoxic, not tachypneic, known to have EF of 35%. Admitted for further evaluation.    PT Comments    Pt progressing towards physical therapy goals. Was able to ambulate 125 feet this session with UE support to guide patient forward and during turns. Pt is able to complete mobility well with min assist and tactile cueing to facilitate transfers. Will continue to follow and progress as able per POC.   Follow Up Recommendations  SNF;Supervision/Assistance - 24 hour     Equipment Recommendations  None recommended by PT    Recommendations for Other Services       Precautions / Restrictions Precautions Precautions: Fall Precaution Comments: Pt is deaf, legally blind, and mute. Can see objects as shadows in the right lighting. Therapist in front of window and waved to pt and pt waved back. Pt is very difficult to understand however appeared to be asking for water, saying thank you, and I love you.  Restrictions Weight Bearing Restrictions: No    Mobility  Bed Mobility Overal bed mobility: Needs Assistance;+ 2 for safety/equipment Bed Mobility: Supine to Sit;Sit to Supine     Supine to sit: Min assist Sit to supine: Min assist   General bed mobility comments: Tactile cues to initiate movement, assist to elevate trunk to upright EOB  Transfers Overall transfer level: Needs assistance Equipment used: 2 person hand held assist Transfers: Sit to/from  Stand Sit to Stand: Min assist;+2 safety/equipment         General transfer comment: Pt was able to power-up to full standing with tactile cues to initiate, and hands placed on seated surface. Therapist provided support at trunk while interpreter giving cues to stand with hands.   Ambulation/Gait Ambulation/Gait assistance: Min assist;+2 safety/equipment Ambulation Distance (Feet): 125 Feet Assistive device: 2 person hand held assist Gait Pattern/deviations: Step-through pattern;Decreased stride length;Trunk flexed;Narrow base of support Gait velocity: Decreased Gait velocity interpretation: Below normal speed for age/gender General Gait Details: Pt was able to ambulate with UE's supported by therapist to guide her forward. As pt fatigued gait pattern was more of a shuffle, but overall did well.    Stairs            Wheelchair Mobility    Modified Rankin (Stroke Patients Only)       Balance Overall balance assessment: Needs assistance Sitting-balance support: Feet supported;No upper extremity supported Sitting balance-Leahy Scale: Fair     Standing balance support: Bilateral upper extremity supported;During functional activity Standing balance-Leahy Scale: Poor Standing balance comment: Pt requires UE support to maintain balance - for comfort/stability                    Cognition Arousal/Alertness: Awake/alert Behavior During Therapy: WFL for tasks assessed/performed Overall Cognitive Status: History of cognitive impairments - at baseline                      Exercises      General Comments        Pertinent Vitals/Pain Pain  Assessment: Faces Pain Score: 0-No pain    Home Living                      Prior Function            PT Goals (current goals can now be found in the care plan section) Acute Rehab PT Goals Patient Stated Goal: Pt did not state goals PT Goal Formulation: Patient unable to participate in goal  setting Time For Goal Achievement: 01/27/15 Potential to Achieve Goals: Good Progress towards PT goals: Progressing toward goals    Frequency  Min 2X/week    PT Plan Current plan remains appropriate    Co-evaluation             End of Session Equipment Utilized During Treatment: Gait belt Activity Tolerance: Patient tolerated treatment well Patient left: in bed;with bed alarm set;with call bell/phone within reach     Time: 0913-0935 PT Time Calculation (min) (ACUTE ONLY): 22 min  Charges:  $Gait Training: 8-22 mins                    G Codes:      Rolinda Roan 02/06/15, 9:48 AM   Rolinda Roan, PT, DPT Acute Rehabilitation Services Pager: [phone]

## 2015-01-15 NOTE — Progress Notes (Signed)
[name] Progress Note  Assessment/Plan: 1. FTT - resides in NH - perked up yesterday and today though sleeping at present; anticipate return to NH 2. ESRD - MWF K 4.1 3. Anemia -  Hgb down to 9.1 - watch, on weekly ARanesp and Fe 4. Secondary hyperparathyroidism -  5. HTN/volume - Had UF of 3.6 Wed with post weight of 80.6 - hard to believe she really gained 3.7 since then, but possibly - goal 4 L - Keep systolic BP [id number] 6. Nutrition - renal diet /vitamin/prostat  [place] 01/15/2015,9:35 AM  LOS: 2 days   Subjective:   Per nursing staff, pt ate better this am and even stood up.  Didn't sleep well last night.  Objective Filed Vitals:   01/14/15 0930 01/14/15 1849 01/14/15 2127 01/15/15 0546  BP: 113/53 123/59  117/52  Pulse: 68 69 70 67  Temp: 99.1 F (37.3 C) 97.4 F (36.3 C) 98.8 F (37.1 C) 98.6 F (37 C)  TempSrc: Oral Oral Oral Oral  Resp: 16 16 18 17   Weight:   81.285 kg (179 lb 3.2 oz)   SpO2: 100% 100% 100% 100%   Physical Exam General: snoozing on HD Heart: RRR Lungs: coarse BS Abdomen: soft NT Extremities: no sig edema Dialysis Access: left AVF Qb 400  Dialysis Orders: [facility] on MWF . EDW 80kg HD Bath 2.0k, 2.5 ca Time 4hrs 12min Heparin 9000 . Access LUA AVF  Aranesp 62mcg q wed hd Units IV/HD Venofer 100mg  q hd Loading From op last dose 01/18/15  Other Op labs 9.7 hgb 01/06/15, 23% tfs. Ca 9.0 Phos 4.1 pth 202  Additional Objective Labs: Basic Metabolic Panel:  Recent Labs Lab 01/13/15 0942 01/13/15 1540 01/14/15 0514  NA 133*  --  135  K 4.8  --  3.9  CL 93*  --  96  CO2 27  --  29  GLUCOSE 111*  --  91  BUN 55*  --  22  CREATININE 5.96* 6.39* 3.89*  CALCIUM 9.1  --  8.7  CBC:  Recent Labs Lab 01/13/15 0942 01/13/15 1540 01/14/15 0514  WBC 5.6 5.0 3.8*  NEUTROABS 3.0  --   --   HGB 10.4* 10.7* 10.3*  HCT 31.8* 32.6* 31.8*  MCV 97.0 96.7  99.7  PLT 258 251 229   BStudies/Results: Dg Chest 2 View  01/14/2015   CLINICAL DATA:  59 year old female with a recent diagnosis of pneumonia.  EXAM: CHEST  2 VIEW  COMPARISON:  Chest x-ray 01/13/2015  FINDINGS: Very low inspiratory volumes. Pulmonary vascular congestion is significantly improved compared to yesterday. Persistent cardiomegaly. Bibasilar opacities are nonspecific and may reflect atelectasis or infiltrate. Lateral chest x-ray significantly limited given patient's inability to raise arms.  IMPRESSION: 1. Significantly improved interstitial pulmonary edema. 2. Stable cardiomegaly. 3. Lower inspiratory volumes. 4. Nonspecific bibasilar opacities may reflect atelectasis or infiltrate.   Electronically Signed   By: [name] M.D.   On: 01/14/2015 18:40   Dg Chest Port 1 View  01/13/2015   CLINICAL DATA:  Failure to thrive and fevers  EXAM: PORTABLE CHEST - 1 VIEW  COMPARISON:  12/14/2014  FINDINGS: Cardiac shadow is stable. Increased vascular congestion with mild interstitial edema is now seen. No focal confluent infiltrate is noted.  IMPRESSION: Mild CHF.   Electronically Signed   By: [name] M.D.   On: 01/13/2015 10:21   Dg Foot 2 Views Right  01/14/2015  CLINICAL DATA:  Cellulitis right great toe.  Diabetes.  EXAM: RIGHT FOOT - 2 VIEW  COMPARISON:  01/08/2010.  FINDINGS: No acute soft tissue or bony abnormality. Severe degenerative changes noted about the ankle and foot projected about the calcaneal cuboid and talonavicular joints. Periarticular lucencies are noted about the right fifth metatarsophalangeal joint. Clinical correlation suggested to exclude an inflammatory or infectious arthropathy at this joint. No erosive changes noted about the right great toe. If osteomyelitis remains clinical concern, MRI can be obtained .  IMPRESSION: 1.  Diffuse degenerative changes.  2. Periarticular lucencies noted about the right fifth metatarsal joint. An inflammatory or infectious  arthropathy cannot be excluded. Clinical correlation suggested. No erosive changes or evidence of osteomyelitis noted about the right great toe in region of clinical concern. If osteomyelitis or septic arthritis remains of clinical concern MRI can be obtained .   Electronically Signed   By: [name]  Register   On: 01/14/2015 07:38   Medications:   . amiodarone  200 mg Oral Daily  . aspirin  81 mg Oral Daily  . atorvastatin  10 mg Oral Daily  . calcium acetate  667 mg Oral TID WC  . cefUROXime  500 mg Oral QHS  . clopidogrel  75 mg Oral Daily  . [START ON 01/20/2015] darbepoetin (ARANESP) injection - DIALYSIS  60 mcg Intravenous Q Wed-HD  . docusate sodium  100 mg Oral BID  . feeding supplement (PRO-STAT SUGAR FREE 64)  30 mL Oral TID WC  . [START ON 01/20/2015] ferric gluconate (FERRLECIT/NULECIT) IV  62.5 mg Intravenous Q Wed-HD  . guaiFENesin  600 mg Oral BID  . heparin  5,000 Units Subcutaneous 3 times per day  . lisinopril  2.5 mg Oral Daily  . metoprolol succinate  50 mg Oral Daily  . multivitamin  1 tablet Oral QHS    I have seen and examined this patient and agree with plan as outlined by Amalia Hailey, PA-C. Donato Heinz A,MD 01/15/2015 2:21 PM

## 2015-01-15 NOTE — Procedures (Signed)
Patient was seen on dialysis and the procedure was supervised. BFR 400 Via LAVF BP is 117/52.  Patient appears to be tolerating treatment well.

## 2015-01-15 NOTE — Care Management Note (Signed)
CARE MANAGEMENT NOTE 01/15/2015  Patient:  RIYA, CADDELL   Account Number:  [id number]  Date Initiated:  01/15/2015  Documentation initiated by:  Jerry Haugen  Subjective/Objective Assessment:   CM following for progession and d/c planning.     Action/Plan:   CSW following this  pt for return to SNF when stable . No CM needs.   Anticipated DC Date:  01/16/2015   Anticipated DC Plan:  SKILLED NURSING FACILITY         Choice offered to / List presented to:             Status of service:  Completed, signed off Medicare Important Message given?  NA - LOS <3 / Initial given by admissions (If response is "NO", the following Medicare IM given date fields will be blank) Date Medicare IM given:   Medicare IM given by:   Date Additional Medicare IM given:   Additional Medicare IM given by:    Discharge Disposition:  [place]  Per UR Regulation:    If discussed at [facility] Length of Stay Meetings, dates discussed:    Comments:

## 2015-01-16 DIAGNOSIS — R011 Cardiac murmur, unspecified: Secondary | ICD-10-CM | POA: Diagnosis not present

## 2015-01-16 DIAGNOSIS — J189 Pneumonia, unspecified organism: Secondary | ICD-10-CM | POA: Diagnosis not present

## 2015-01-16 DIAGNOSIS — Z992 Dependence on renal dialysis: Secondary | ICD-10-CM | POA: Diagnosis not present

## 2015-01-16 DIAGNOSIS — M86271 Subacute osteomyelitis, right ankle and foot: Secondary | ICD-10-CM | POA: Diagnosis not present

## 2015-01-16 DIAGNOSIS — L03031 Cellulitis of right toe: Secondary | ICD-10-CM | POA: Diagnosis not present

## 2015-01-16 DIAGNOSIS — L97519 Non-pressure chronic ulcer of other part of right foot with unspecified severity: Secondary | ICD-10-CM | POA: Diagnosis not present

## 2015-01-16 DIAGNOSIS — I255 Ischemic cardiomyopathy: Secondary | ICD-10-CM | POA: Diagnosis not present

## 2015-01-16 DIAGNOSIS — D259 Leiomyoma of uterus, unspecified: Secondary | ICD-10-CM | POA: Diagnosis present

## 2015-01-16 DIAGNOSIS — N186 End stage renal disease: Secondary | ICD-10-CM | POA: Diagnosis not present

## 2015-01-16 DIAGNOSIS — I509 Heart failure, unspecified: Secondary | ICD-10-CM | POA: Diagnosis not present

## 2015-01-16 DIAGNOSIS — G9340 Encephalopathy, unspecified: Secondary | ICD-10-CM | POA: Diagnosis not present

## 2015-01-16 DIAGNOSIS — I472 Ventricular tachycardia: Secondary | ICD-10-CM | POA: Diagnosis not present

## 2015-01-16 DIAGNOSIS — H540 Blindness, both eyes: Secondary | ICD-10-CM

## 2015-01-16 DIAGNOSIS — J811 Chronic pulmonary edema: Secondary | ICD-10-CM | POA: Diagnosis not present

## 2015-01-16 DIAGNOSIS — Z66 Do not resuscitate: Secondary | ICD-10-CM | POA: Diagnosis present

## 2015-01-16 DIAGNOSIS — H9190 Unspecified hearing loss, unspecified ear: Secondary | ICD-10-CM

## 2015-01-16 DIAGNOSIS — D509 Iron deficiency anemia, unspecified: Secondary | ICD-10-CM | POA: Diagnosis not present

## 2015-01-16 DIAGNOSIS — M86272 Subacute osteomyelitis, left ankle and foot: Secondary | ICD-10-CM | POA: Diagnosis not present

## 2015-01-16 DIAGNOSIS — R0600 Dyspnea, unspecified: Secondary | ICD-10-CM | POA: Diagnosis not present

## 2015-01-16 DIAGNOSIS — Z7901 Long term (current) use of anticoagulants: Secondary | ICD-10-CM | POA: Diagnosis not present

## 2015-01-16 DIAGNOSIS — D631 Anemia in chronic kidney disease: Secondary | ICD-10-CM | POA: Diagnosis not present

## 2015-01-16 DIAGNOSIS — R4182 Altered mental status, unspecified: Secondary | ICD-10-CM | POA: Diagnosis not present

## 2015-01-16 DIAGNOSIS — I5041 Acute combined systolic (congestive) and diastolic (congestive) heart failure: Secondary | ICD-10-CM | POA: Diagnosis not present

## 2015-01-16 DIAGNOSIS — J9601 Acute respiratory failure with hypoxia: Secondary | ICD-10-CM | POA: Diagnosis present

## 2015-01-16 DIAGNOSIS — L97529 Non-pressure chronic ulcer of other part of left foot with unspecified severity: Secondary | ICD-10-CM | POA: Diagnosis not present

## 2015-01-16 DIAGNOSIS — I739 Peripheral vascular disease, unspecified: Secondary | ICD-10-CM | POA: Diagnosis not present

## 2015-01-16 DIAGNOSIS — Z7382 Dual sensory impairment: Secondary | ICD-10-CM

## 2015-01-16 DIAGNOSIS — E1129 Type 2 diabetes mellitus with other diabetic kidney complication: Secondary | ICD-10-CM | POA: Diagnosis not present

## 2015-01-16 DIAGNOSIS — I871 Compression of vein: Secondary | ICD-10-CM | POA: Diagnosis not present

## 2015-01-16 DIAGNOSIS — T82858D Stenosis of vascular prosthetic devices, implants and grafts, subsequent encounter: Secondary | ICD-10-CM | POA: Diagnosis not present

## 2015-01-16 DIAGNOSIS — F28 Other psychotic disorder not due to a substance or known physiological condition: Secondary | ICD-10-CM | POA: Diagnosis not present

## 2015-01-16 DIAGNOSIS — R627 Adult failure to thrive: Secondary | ICD-10-CM | POA: Diagnosis not present

## 2015-01-16 DIAGNOSIS — I5021 Acute systolic (congestive) heart failure: Secondary | ICD-10-CM | POA: Diagnosis not present

## 2015-01-16 DIAGNOSIS — F4320 Adjustment disorder, unspecified: Secondary | ICD-10-CM | POA: Diagnosis not present

## 2015-01-16 DIAGNOSIS — D649 Anemia, unspecified: Secondary | ICD-10-CM | POA: Diagnosis not present

## 2015-01-16 DIAGNOSIS — Z794 Long term (current) use of insulin: Secondary | ICD-10-CM | POA: Diagnosis not present

## 2015-01-16 DIAGNOSIS — M868X7 Other osteomyelitis, ankle and foot: Secondary | ICD-10-CM | POA: Diagnosis not present

## 2015-01-16 DIAGNOSIS — I2109 ST elevation (STEMI) myocardial infarction involving other coronary artery of anterior wall: Secondary | ICD-10-CM | POA: Diagnosis not present

## 2015-01-16 DIAGNOSIS — E119 Type 2 diabetes mellitus without complications: Secondary | ICD-10-CM | POA: Diagnosis not present

## 2015-01-16 DIAGNOSIS — F29 Unspecified psychosis not due to a substance or known physiological condition: Secondary | ICD-10-CM | POA: Diagnosis not present

## 2015-01-16 DIAGNOSIS — I5023 Acute on chronic systolic (congestive) heart failure: Secondary | ICD-10-CM | POA: Diagnosis not present

## 2015-01-16 DIAGNOSIS — E8770 Fluid overload, unspecified: Secondary | ICD-10-CM | POA: Diagnosis not present

## 2015-01-16 DIAGNOSIS — E875 Hyperkalemia: Secondary | ICD-10-CM | POA: Diagnosis not present

## 2015-01-16 DIAGNOSIS — L03032 Cellulitis of left toe: Secondary | ICD-10-CM | POA: Diagnosis not present

## 2015-01-16 DIAGNOSIS — N2581 Secondary hyperparathyroidism of renal origin: Secondary | ICD-10-CM | POA: Diagnosis not present

## 2015-01-16 DIAGNOSIS — E785 Hyperlipidemia, unspecified: Secondary | ICD-10-CM | POA: Diagnosis not present

## 2015-01-16 DIAGNOSIS — M19042 Primary osteoarthritis, left hand: Secondary | ICD-10-CM | POA: Diagnosis not present

## 2015-01-16 DIAGNOSIS — R0602 Shortness of breath: Secondary | ICD-10-CM | POA: Diagnosis not present

## 2015-01-16 DIAGNOSIS — I214 Non-ST elevation (NSTEMI) myocardial infarction: Secondary | ICD-10-CM | POA: Diagnosis not present

## 2015-01-16 DIAGNOSIS — J810 Acute pulmonary edema: Secondary | ICD-10-CM | POA: Diagnosis not present

## 2015-01-16 DIAGNOSIS — H913 Deaf nonspeaking, not elsewhere classified: Secondary | ICD-10-CM | POA: Diagnosis not present

## 2015-01-16 DIAGNOSIS — J209 Acute bronchitis, unspecified: Secondary | ICD-10-CM | POA: Diagnosis not present

## 2015-01-16 DIAGNOSIS — I252 Old myocardial infarction: Secondary | ICD-10-CM | POA: Diagnosis not present

## 2015-01-16 DIAGNOSIS — N189 Chronic kidney disease, unspecified: Secondary | ICD-10-CM | POA: Diagnosis not present

## 2015-01-16 DIAGNOSIS — I10 Essential (primary) hypertension: Secondary | ICD-10-CM | POA: Diagnosis not present

## 2015-01-16 DIAGNOSIS — F419 Anxiety disorder, unspecified: Secondary | ICD-10-CM | POA: Diagnosis not present

## 2015-01-16 DIAGNOSIS — I2510 Atherosclerotic heart disease of native coronary artery without angina pectoris: Secondary | ICD-10-CM | POA: Diagnosis present

## 2015-01-16 DIAGNOSIS — I5043 Acute on chronic combined systolic (congestive) and diastolic (congestive) heart failure: Secondary | ICD-10-CM | POA: Diagnosis present

## 2015-01-16 DIAGNOSIS — M869 Osteomyelitis, unspecified: Secondary | ICD-10-CM | POA: Diagnosis not present

## 2015-01-16 DIAGNOSIS — Z7982 Long term (current) use of aspirin: Secondary | ICD-10-CM | POA: Diagnosis not present

## 2015-01-16 DIAGNOSIS — I120 Hypertensive chronic kidney disease with stage 5 chronic kidney disease or end stage renal disease: Secondary | ICD-10-CM | POA: Diagnosis not present

## 2015-01-16 DIAGNOSIS — R079 Chest pain, unspecified: Secondary | ICD-10-CM | POA: Diagnosis not present

## 2015-01-16 DIAGNOSIS — E1322 Other specified diabetes mellitus with diabetic chronic kidney disease: Secondary | ICD-10-CM | POA: Diagnosis not present

## 2015-01-16 LAB — CBC
HCT: 34 % — ABNORMAL LOW (ref 36.0–46.0)
Hemoglobin: 11.1 g/dL — ABNORMAL LOW (ref 12.0–15.0)
MCH: 31.5 pg (ref 26.0–34.0)
MCHC: 32.6 g/dL (ref 30.0–36.0)
MCV: 96.6 fL (ref 78.0–100.0)
Platelets: 207 10*3/uL (ref 150–400)
RBC: 3.52 MIL/uL — ABNORMAL LOW (ref 3.87–5.11)
RDW: 14.7 % (ref 11.5–15.5)
WBC: 3.9 10*3/uL — ABNORMAL LOW (ref 4.0–10.5)

## 2015-01-16 LAB — BASIC METABOLIC PANEL
Anion gap: 12 (ref 5–15)
BUN: 35 mg/dL — ABNORMAL HIGH (ref 6–23)
CO2: 24 mmol/L (ref 19–32)
Calcium: 8.8 mg/dL (ref 8.4–10.5)
Chloride: 92 mmol/L — ABNORMAL LOW (ref 96–112)
Creatinine, Ser: 4.63 mg/dL — ABNORMAL HIGH (ref 0.50–1.10)
GFR calc Af Amer: 11 mL/min — ABNORMAL LOW (ref >90)
GFR calc non Af Amer: 10 mL/min — ABNORMAL LOW (ref >90)
Glucose, Bld: 91 mg/dL (ref 70–99)
Potassium: 4.2 mmol/L (ref 3.5–5.1)
Sodium: 128 mmol/L — ABNORMAL LOW (ref 135–145)

## 2015-01-16 LAB — GLUCOSE, CAPILLARY
Glucose-Capillary: 90 mg/dL (ref 70–99)
Glucose-Capillary: 95 mg/dL (ref 70–99)

## 2015-01-16 MED ORDER — LEVALBUTEROL HCL 0.63 MG/3ML IN NEBU
0.6300 mg | INHALATION_SOLUTION | RESPIRATORY_TRACT | Status: DC | PRN
Start: 2015-01-16 — End: 2015-05-16

## 2015-01-16 MED ORDER — LACTULOSE 10 GM/15ML PO SOLN: 20.0000 g | Freq: Two times a day (BID) | ORAL | Status: DC | PRN

## 2015-01-16 MED ORDER — CEFUROXIME AXETIL 500 MG PO TABS: 500.0000 mg | ORAL_TABLET | Freq: Every day | ORAL | Status: AC

## 2015-01-16 MED ORDER — GUAIFENESIN ER 600 MG PO TB12: 600.0000 mg | ORAL_TABLET | Freq: Two times a day (BID) | ORAL | Status: DC

## 2015-01-16 NOTE — Discharge Summary (Signed)
Physician Discharge Summary  [name] [id number] DOB: 1956/10/12 DOA: 01/13/2015  PCP: [name], DO  Admit date: 01/13/2015 Discharge date: 01/16/2015  Time spent: 30 minutes  Recommendations for Outpatient Follow-up:  1. Continue antibiotics for mild acute bronchitis for 3 more days 2. Return to [facility] (SNF) 3. Please make sure that patient is fed by staff member for all meals and snacks 4. Recommend continue current wound care after discharge; Aquacel to absorb drainage and provide antimicrobial benefits to right toe. Barrier cream to repel moisture and protect skin to gluteal fold.  Discharge Diagnoses:  Active Problems:   Cardiomyopathy, ischemic-EF 30-35%-stable   history of Sustained VT (ventricular tachycardia) on Amiodarone   CKD (chronic kidney disease) stage V requiring chronic dialysis   Acute bronchitis- resolving   Essential hypertension   Diabetes mellitus with ESRD (end-stage renal disease)   Ulcer of right great toe   HLD (hyperlipidemia)   Deaf mutism, congenital   Anemia in CKD (chronic kidney disease)   FTT (failure to thrive) in adult due to acute infection and acute, mils pulmonary edema   Discharge Condition: stable  Diet recommendation: Renal with 1200 cc fluid restriction  Filed Weights   01/15/15 0945 01/15/15 1353 01/15/15 2202  Weight: 185 lb 13.6 oz (84.3 kg) 179 lb 0.2 oz (81.2 kg) 180 lb 3.2 oz (81.738 kg)    History of present illness:  59 year old female patient on chronic kidney disease on dialysis, hypertension, anterior wall MI and anemia. She has been deaf and blind since childhood. Patient had recent admission in December 2015 after being found pulseless and apneic after dialysis treatment at [name]. After arrival to the ER she had developed ventricular tachycardia and later workup revealed patient had ischemic cardio myopathy with an EF of around 30%. During that hospitalization  cardiology had documented that she was not a candidate for any ICD and recommended DO NOT RESUSCITATE status. Palliative medicine was also consulted during the admission and had an extensive discussion with family; unfortunately son was not ready to make patient a DO NOT RESUSCITATE or comfort care that time. He did verbalize understanding that his mother had a guarded prognosis. Patient was started on amiodarone for [name] recurrent ventricular tachycardia and was discharged on this medication.  She returned to the ER from the nursing facility due to issues related to poor appetite, suspected dehydration and poor oral intake. In the ER she was afebrile but was hemodynamically stable. She did not have any leukocytosis. She was noticed to have a significant nonproductive cough but chest x-ray did not show opacity or infiltrate although did have some interstitial changes consistent with mild CHF. She was not hypoxic she was not tachypneic. Review of outpatient cardiology notes reveal that on 1/5 during that visit she was maintaining normal sinus rhythm and [name] amiodarone dosage was to be decreased from 400 to 200 mg daily on January 13th.  Hospital Course:   Cardiomyopathy, ischemic-EF 30-35%/ history of Sustained VT  -Continues to maintain sinus rhythm on 200 mg amiodarone-previously deemed not a candidate for AICD-patient's cough likely secondary to moderate volume overload since as edematous changes have improved after frequent dialysis [name] respiratory and failure to thrive symptoms have improved   CKD (chronic kidney disease) stage V requiring chronic dialysis -continue M-W-F dialysis schedule   Suspected Acute bronchitis -Presumptive diagnosis based on presenting symptoms of cough-no acute infiltrates on chest x-ray-influenza panel was negative-given history of ventricular tachycardia and QTC  greater than 480 we discontinued Levaquin in favor of cefepime; no clear-cut pneumonia so have narrowed  antibiotics to Ceftin by mouth and plan to continue for 3 more days after discharge-cough may have been more for manifestation of volume overload  ? FTT -Reported as eating poorly at nursing facility-staff here state patient eating greater than 85% of meals and drinking plenty of fluids but noted patient must be assisted with meals since she has blind   Essential hypertension -Blood pressure well controlled-continue lisinopril and metoprolol   Diabetes mellitus  -Diet controlled-hemoglobin A1c 6.0 during last admission   Ulcer of right great toe -Non-stagable ulcer right great toe-appreciate WOC RN assistance; appropriate wound care ordered: Aquacel to absorb drainage and provide antimicrobial benefits to right toe. Barrier cream to repel moisture and protect skin to gluteal fold.  Procedures:  None  Consultations:  Nephrology  Discharge Exam: Filed Vitals:   01/16/15 0906  BP: 127/63  Pulse: 106  Temp: 98.4 F (36.9 C)  Resp: 18   Gen: No acute respiratory distress; at baseline of nonverbal due to deaf-mutism Chest: Bilateral lungs sounds clear to auscultation, room air Cardiac: Regular rate and rhythm, S1-S2, no rubs murmurs or gallops, no peripheral edema, no JVD Abdomen: Soft nontender nondistended without obvious hepatosplenomegaly, no ascites Extremities: Symmetrical in appearance without cyanosis, clubbing or effusion; quarter-sized and moist ulcer on dorsum great toe with 100% fibrin base   Discharge Instructions   Discharge Instructions    Diet Carb Modified    Complete by:  As directed   Renal with 1200 cc fluid restriction- PLEASE FEED PATIENT     Increase activity slowly    Complete by:  As directed           Current Discharge Medication List    START taking these medications   Details  cefUROXime (CEFTIN) 500 MG tablet Take 1 tablet (500 mg total) by mouth at bedtime.    guaiFENesin (MUCINEX) 600 MG 12 hr tablet Take 1 tablet (600 mg total) by  mouth 2 (two) times daily.      CONTINUE these medications which have CHANGED   Details  lactulose (CHRONULAC) 10 GM/15ML solution Take 30 mLs (20 g total) by mouth 2 (two) times daily as needed for moderate constipation. Qty: 240 mL, Refills: 0    levalbuterol (XOPENEX) 0.63 MG/3ML nebulizer solution Take 3 mLs (0.63 mg total) by nebulization every 2 (two) hours as needed for wheezing or shortness of breath. Qty: 3 mL, Refills: 12      CONTINUE these medications which have NOT CHANGED   Details  Amino Acids-Protein Hydrolys (FEEDING SUPPLEMENT, PRO-STAT SUGAR FREE 64,) LIQD Take 30 mLs by mouth 3 (three) times daily with meals. Qty: 900 mL, Refills: 0    amiodarone (PACERONE) 200 MG tablet Take 200 mg by mouth daily.    aspirin 81 MG tablet Take 81 mg by mouth daily. For hypertension    atorvastatin (LIPITOR) 10 MG tablet Take 10 mg by mouth daily.    calcium acetate (PHOSLO) 667 MG capsule Take 1 capsule (667 mg total) by mouth 3 (three) times daily with meals. Qty: 30 capsule, Refills: 1    clopidogrel (PLAVIX) 75 MG tablet Take 1 tablet (75 mg total) by mouth daily.    Docusate Sodium (DSS) 100 MG CAPS Take 100 mg by mouth 2 (two) times daily. For constipation    lisinopril (PRINIVIL,ZESTRIL) 2.5 MG tablet Take 1 tablet (2.5 mg total) by mouth daily.    metoprolol  succinate (TOPROL-XL) 50 MG 24 hr tablet Take 1 tablet (50 mg total) by mouth daily. Take with or immediately following a meal.    multivitamin (RENA-VIT) TABS tablet Take 1 tablet by mouth at bedtime. Qty: 30 tablet, Refills: 0    nitroGLYCERIN (NITROSTAT) 0.4 MG SL tablet Place 1 tablet (0.4 mg total) under the tongue every 5 (five) minutes as needed for chest pain. Refills: 12      STOP taking these medications     bisacodyl (DULCOLAX) 10 MG suppository        No Known Allergies    The results of significant diagnostics from this hospitalization (including imaging, microbiology, ancillary and  laboratory) are listed below for reference.    Significant Diagnostic Studies: Dg Chest 2 View  01/14/2015   CLINICAL DATA:  59 year old female with a recent diagnosis of pneumonia.  EXAM: CHEST  2 VIEW  COMPARISON:  Chest x-ray 01/13/2015  FINDINGS: Very low inspiratory volumes. Pulmonary vascular congestion is significantly improved compared to yesterday. Persistent cardiomegaly. Bibasilar opacities are nonspecific and may reflect atelectasis or infiltrate. Lateral chest x-ray significantly limited given patient's inability to raise arms.  IMPRESSION: 1. Significantly improved interstitial pulmonary edema. 2. Stable cardiomegaly. 3. Lower inspiratory volumes. 4. Nonspecific bibasilar opacities may reflect atelectasis or infiltrate.   Electronically Signed   By: Jacqulynn Cadet M.D.   On: 01/14/2015 18:40   Dg Chest Port 1 View  01/13/2015   CLINICAL DATA:  Failure to thrive and fevers  EXAM: PORTABLE CHEST - 1 VIEW  COMPARISON:  12/14/2014  FINDINGS: Cardiac shadow is stable. Increased vascular congestion with mild interstitial edema is now seen. No focal confluent infiltrate is noted.  IMPRESSION: Mild CHF.   Electronically Signed   By: Inez Catalina M.D.   On: 01/13/2015 10:21   Dg Foot 2 Views Right  01/14/2015   CLINICAL DATA:  Cellulitis right great toe.  Diabetes.  EXAM: RIGHT FOOT - 2 VIEW  COMPARISON:  01/08/2010.  FINDINGS: No acute soft tissue or bony abnormality. Severe degenerative changes noted about the ankle and foot projected about the calcaneal cuboid and talonavicular joints. Periarticular lucencies are noted about the right fifth metatarsophalangeal joint. Clinical correlation suggested to exclude an inflammatory or infectious arthropathy at this joint. No erosive changes noted about the right great toe. If osteomyelitis remains clinical concern, MRI can be obtained .  IMPRESSION: 1.  Diffuse degenerative changes.  2. Periarticular lucencies noted about the right fifth metatarsal  joint. An inflammatory or infectious arthropathy cannot be excluded. Clinical correlation suggested. No erosive changes or evidence of osteomyelitis noted about the right great toe in region of clinical concern. If osteomyelitis or septic arthritis remains of clinical concern MRI can be obtained .   Electronically Signed   By: Marcello Moores  Register   On: 01/14/2015 07:38    Microbiology: Recent Results (from the past 240 hour(s))  Urine culture     Status: None   Collection Time: 01/13/15  9:53 AM  Result Value Ref Range Status   Specimen Description URINE, CLEAN CATCH  Final   Special Requests NONE  Final   Colony Count NO GROWTH Performed at [facility]   Final   Culture NO GROWTH Performed at [facility]   Final   Report Status 01/14/2015 FINAL  Final  Blood culture (routine x 2)     Status: None (Preliminary result)   Collection Time: 01/13/15  3:40 PM  Result Value Ref Range Status  Specimen Description BLOOD RIGHT ARM  Final   Special Requests BOTTLES DRAWN AEROBIC ONLY 10CC  Final   Culture   Final           BLOOD CULTURE RECEIVED NO GROWTH TO DATE CULTURE WILL BE HELD FOR 5 DAYS BEFORE ISSUING A FINAL NEGATIVE REPORT Performed at [facility]    Report Status PENDING  Incomplete  Blood culture (routine x 2)     Status: None (Preliminary result)   Collection Time: 01/13/15  3:50 PM  Result Value Ref Range Status   Specimen Description BLOOD RIGHT HAND  Final   Special Requests BOTTLES DRAWN AEROBIC ONLY 10CC  Final   Culture   Final           BLOOD CULTURE RECEIVED NO GROWTH TO DATE CULTURE WILL BE HELD FOR 5 DAYS BEFORE ISSUING A FINAL NEGATIVE REPORT Performed at [facility]    Report Status PENDING  Incomplete     Labs: Basic Metabolic Panel:  Recent Labs Lab 01/13/15 0942 01/13/15 1540 01/14/15 0514 01/15/15 0500  NA 133*  --  135 129*  K 4.8  --  3.9 4.1  CL 93*  --  96 93*  CO2 27  --  29 25  GLUCOSE 111*  --  91 109*    BUN 55*  --  22 49*  CREATININE 5.96* 6.39* 3.89* 6.03*  CALCIUM 9.1  --  8.7 8.3*   Liver Function Tests:  Recent Labs Lab 01/15/15 0500  AST 14  ALT 12  ALKPHOS 65  BILITOT 0.5  PROT 6.0  ALBUMIN 2.8*   No results for input(s): LIPASE, AMYLASE in the last 168 hours. No results for input(s): AMMONIA in the last 168 hours. CBC:  Recent Labs Lab 01/13/15 0942 01/13/15 1540 01/14/15 0514 01/15/15 0500  WBC 5.6 5.0 3.8* 4.7  NEUTROABS 3.0  --   --   --   HGB 10.4* 10.7* 10.3* 9.1*  HCT 31.8* 32.6* 31.8* 28.1*  MCV 97.0 96.7 99.7 95.6  PLT 258 251 229 208   Cardiac Enzymes: No results for input(s): CKTOTAL, CKMB, CKMBINDEX, TROPONINI in the last 168 hours. BNP: BNP (last 3 results)  Recent Labs  11/19/14 2335 12/12/14 0314  PROBNP 19448.0* 24864.0*   CBG: No results for input(s): GLUCAP in the last 168 hours.     SignedSamella Parr ANP [facility] 01/16/2015, 9:53 AM  Addendum  Patient is a 59 year old female with a past medical history of chronic disease on hemodialysis, hypertension, deafness/blind since childhood who was admitted to the medicine service on 01/13/2015 when she presented with functional decline/failure to thrive. It was suspected that underlying infectious process, possibly acute bacterial bronchitis may have precipitated to symptoms. During this hospitalization she was treated initially with IV antibiotic therapy then transition to Ceftin. Also seen and evaluated by nephrology as she received hemodialysis during this hospitalization. Patient appearing to return to [name] baseline in the last 2 days, during my evaluation she is awake, alert, nontoxic appearing and tolerating by mouth intake. Plan to discharge [name] on 3 more days of empiric oral antimicrobial therapy with Ceftin. [facility] was [facility] to [name] on 01/16/2015.

## 2015-01-16 NOTE — Progress Notes (Signed)
Informed patient's son,[name] Rhudy that hie mother would be coming back to [facility].

## 2015-01-16 NOTE — Progress Notes (Signed)
[facility] Progress Note  Assessment/Plan: 1. FTT - resides in NH - perked up yesterday and today though sleeping at present; anticipate return to NH today 2. ESRD - MWF K 4.1 1/22 no change in diet orders at d/c 3. Anemia - Hgb down to 9.1 - watch, on weekly ARanesp and Fe 4. Secondary hyperparathyroidism - no change in management at d/c 5. HTN/volume - Had UF of 3.6 Wed with  Net UF 3.4 Friday with a post weight of 81.2 6. Nutrition - renal diet /vitamin/prostat 7. ? Acute Bronchitis - d/c on 3 more days of [place], PA-C [place] [phone] 01/16/2015,8:50 AM  LOS: 3 days   Pt seen, examined and agree w A/P as above.  Kelly Splinter MD pager [phone]    cell [phone] 01/16/2015, 2:34 PM    Subjective:   "bath room" (notified RN that pt might need to go to the BR)  Objective Filed Vitals:   01/15/15 1330 01/15/15 1353 01/15/15 2202 01/16/15 0520  BP: 122/71 127/70 123/54 131/55  Pulse: 72 71 70 69  Temp:   98.7 F (37.1 C) 98.5 F (36.9 C)  TempSrc:   Oral Oral  Resp:  16 18 17   Weight:  81.2 kg (179 lb 0.2 oz) 81.738 kg (180 lb 3.2 oz)   SpO2:  99% 99% 98%   Physical Exam General: NAD  Heart: RRR Lungs: grossly clear without rales Abdomen: soft NT Extremities: SCDs in place no overt edema Dialysis Access:  Left AVF + bruit  Dialysis Orders: [place] on MWF . EDW 80kg HD Bath 2.0k, 2.5 ca Time 4hrs 63min Heparin 9000 . Access LUA AVF  Aranesp 31mcg q wed hd Units IV/HD Venofer 100mg  q hd Loading From op last dose 01/18/15  Other Op labs 9.7 hgb 01/06/15, 23% tfs. Ca 9.0 Phos 4.1 pth 202  Additional Objective Labs: Basic Metabolic Panel:  Recent Labs Lab 01/13/15 0942 01/13/15 1540 01/14/15 0514 01/15/15 0500  NA 133*  --  135 129*  K 4.8  --  3.9 4.1  CL 93*  --  96 93*  CO2 27  --  29 25  GLUCOSE 111*  --  91 109*  BUN 55*  --  22 49*  CREATININE 5.96* 6.39* 3.89* 6.03*   CALCIUM 9.1  --  8.7 8.3*   Liver Function Tests:  Recent Labs Lab 01/15/15 0500  AST 14  ALT 12  ALKPHOS 65  BILITOT 0.5  PROT 6.0  ALBUMIN 2.8*   CBC:  Recent Labs Lab 01/13/15 0942 01/13/15 1540 01/14/15 0514 01/15/15 0500  WBC 5.6 5.0 3.8* 4.7  NEUTROABS 3.0  --   --   --   HGB 10.4* 10.7* 10.3* 9.1*  HCT 31.8* 32.6* 31.8* 28.1*  MCV 97.0 96.7 99.7 95.6  PLT 258 251 229 208   Blood Culture    Component Value Date/Time   SDES BLOOD RIGHT HAND 01/13/2015 1550   SPECREQUEST BOTTLES DRAWN AEROBIC ONLY 10CC 01/13/2015 1550   CULT  01/13/2015 1550           BLOOD CULTURE RECEIVED NO GROWTH TO DATE CULTURE WILL BE HELD FOR 5 DAYS BEFORE ISSUING A FINAL NEGATIVE REPORT Performed at [facility] PENDING 01/13/2015 1550   Studies/Results: Dg Chest 2 View  01/14/2015   CLINICAL DATA:  59 year old female with a recent diagnosis of pneumonia.  EXAM: CHEST  2 VIEW  COMPARISON:  Chest x-ray  01/13/2015  FINDINGS: Very low inspiratory volumes. Pulmonary vascular congestion is significantly improved compared to yesterday. Persistent cardiomegaly. Bibasilar opacities are nonspecific and may reflect atelectasis or infiltrate. Lateral chest x-ray significantly limited given patient's inability to raise arms.  IMPRESSION: 1. Significantly improved interstitial pulmonary edema. 2. Stable cardiomegaly. 3. Lower inspiratory volumes. 4. Nonspecific bibasilar opacities may reflect atelectasis or infiltrate.   Electronically Signed   By: Jacqulynn Cadet M.D.   On: 01/14/2015 18:40   Medications:   . amiodarone  200 mg Oral Daily  . aspirin  81 mg Oral Daily  . atorvastatin  10 mg Oral Daily  . calcium acetate  667 mg Oral TID WC  . cefUROXime  500 mg Oral QHS  . clopidogrel  75 mg Oral Daily  . [START ON 01/20/2015] darbepoetin (ARANESP) injection - DIALYSIS  60 mcg Intravenous Q Wed-HD  . docusate sodium  100 mg Oral BID  . feeding supplement (PRO-STAT SUGAR  FREE 64)  30 mL Oral TID WC  . [START ON 01/20/2015] ferric gluconate (FERRLECIT/NULECIT) IV  62.5 mg Intravenous Q Wed-HD  . guaiFENesin  600 mg Oral BID  . heparin  5,000 Units Subcutaneous 3 times per day  . lisinopril  2.5 mg Oral Daily  . metoprolol succinate  50 mg Oral Daily  . multivitamin  1 tablet Oral QHS

## 2015-01-17 NOTE — Social Work (Signed)
CSW completed patient's discharge to SNF. RN will call [facility] for patient transport.  Christene Lye MSW, [place]

## 2015-01-20 LAB — CULTURE, BLOOD (ROUTINE X 2)
Culture: NO GROWTH
Culture: NO GROWTH

## 2015-01-24 DIAGNOSIS — N186 End stage renal disease: Secondary | ICD-10-CM | POA: Diagnosis not present

## 2015-01-24 DIAGNOSIS — Z992 Dependence on renal dialysis: Secondary | ICD-10-CM | POA: Diagnosis not present

## 2015-01-25 DIAGNOSIS — N186 End stage renal disease: Secondary | ICD-10-CM | POA: Diagnosis not present

## 2015-01-25 DIAGNOSIS — N2581 Secondary hyperparathyroidism of renal origin: Secondary | ICD-10-CM | POA: Diagnosis not present

## 2015-01-25 DIAGNOSIS — D631 Anemia in chronic kidney disease: Secondary | ICD-10-CM | POA: Diagnosis not present

## 2015-01-29 ENCOUNTER — Ambulatory Visit: Payer: Self-pay | Admitting: Internal Medicine

## 2015-02-02 ENCOUNTER — Encounter: Payer: Self-pay | Admitting: Internal Medicine

## 2015-02-02 ENCOUNTER — Ambulatory Visit (INDEPENDENT_AMBULATORY_CARE_PROVIDER_SITE_OTHER): Payer: Medicare Other | Admitting: Internal Medicine

## 2015-02-02 VITALS — BP 120/70 | HR 88 | Ht 68.0 in | Wt 181.0 lb

## 2015-02-02 DIAGNOSIS — E785 Hyperlipidemia, unspecified: Secondary | ICD-10-CM | POA: Diagnosis not present

## 2015-02-02 DIAGNOSIS — H913 Deaf nonspeaking, not elsewhere classified: Secondary | ICD-10-CM | POA: Diagnosis not present

## 2015-02-02 DIAGNOSIS — I255 Ischemic cardiomyopathy: Secondary | ICD-10-CM | POA: Diagnosis not present

## 2015-02-02 DIAGNOSIS — I472 Ventricular tachycardia, unspecified: Secondary | ICD-10-CM

## 2015-02-02 DIAGNOSIS — I10 Essential (primary) hypertension: Secondary | ICD-10-CM

## 2015-02-02 DIAGNOSIS — I2109 ST elevation (STEMI) myocardial infarction involving other coronary artery of anterior wall: Secondary | ICD-10-CM

## 2015-02-02 DIAGNOSIS — I214 Non-ST elevation (NSTEMI) myocardial infarction: Secondary | ICD-10-CM

## 2015-02-02 NOTE — Patient Instructions (Signed)
Your physician wants you to follow-up in: 6 months with Dr. Hilty. You will receive a reminder letter in the mail two months in advance. If you don't receive a letter, please call our office to schedule the follow-up appointment.    

## 2015-02-03 NOTE — Progress Notes (Signed)
Patient ID: [name], female   DOB: [birth year], 59 y.o.   MRN: [id number]     Date:  02/03/2015   ID:  [name], DOB March 29, 1956, MRN [id number]  PCP:  [name] Cranker, DO  Primary Cardiologist:  [name]   Chief Complaint  Patient presents with  . Follow-up    post [name] - denies chest pain; c/o weakness; breathing is OK; c/o problems with toe     History of Present Illness: [name] is a 59 y.o. female known chronic kidney disease on dialysis, hypertension, recent admission for anterior wall MI medically managed, anemia, who was fecal occult blood negative 2, and deaf and blind since childhood who was sent over from the nursing home after being found unconscious after her dialysis treatment on Friday. Nursing home personnel examined the patient and found her to be pulseless and CPR was initiated. An AED was applied and found a non-shockable rhythm. Patient had return of spontaneous circulation/pulse after ~ 6 minutes of chest compression and administration of intraosseous lidocaine. Before these events no apparent acute event or changes were reported by the nursing home staff.  Upon arrival to the emergency department she was alert and responsive. evaluated in the emergency department by cardiology. After arrival she developed ventricular tachycardia as well as acute dyspnea and hypertensive urgency. She was placed on BiPAP, given IV Lasix, IV nitroglycerin and IV amiodarone. It was suspected patient had a ventricular tach arrest prior to arrival. She stabilized and was able to be admitted to stepdown unit.  2-D echocardiogram revealed an ejection fraction 30-35% with severe hypokinesis of the mid apical antero-septal and apical myocardium. Left atrium was moderate to severely dilated. Tricuspid valve moderate regurgitation. Peak PA pressure was 51 mmHg  Shortly after admission to the nursing unit patient once again decompensated from a respiratory standpoint so critical care  medicine was consulted and the patient was transferred to the ICU. Patient's respiratory symptoms responded to dialysis. She is not a candidate for an AICD and recommended discussion with the family about DO NOT RESUSCITATE status since she is likely to have recurrent V. tach in the future.  Titration of cardiac meds has been difficult due to ongoing hypotension/soft blood pressures. Dialysis was accomplished without significant hypotension.  Palliative care met with patient's son. At this time patient's son is not ready to make patient DO NOT RESUSCITATE or comfort care. He understands that his mother has guarded prognosis .  Stress test on December 1 revealed fixed defects involving the apex, anterior lateral wall and the inferior wall. The apex and anterior lateral wall fixed defects are suggestive for an infarct. No evidence for reversibility or ischemia.  I saw [name] back today in the office for follow-up. This is an unfortunate situation as it is very difficult for her to communicate. She is both deaf and blind. We were able to use a signing translator to get some symptoms from her. She reports no further chest pain and feels that her breathing is fairly good. Unfortunately she suffered a significant out of [name] MI and ventricular tachycardia which has been controlled. She was placed on amiodarone which was recently decreased. She is also on optimal medical therapy for coronary disease including aspirin and Plavix. She is not a candidate for coronary intervention. Additionally she is on lisinopril, metoprolol and atorvastatin.  Wt Readings from Last 3 Encounters:  02/02/15 181 lb (82.101 kg)  01/15/15 180 lb 3.2 oz (81.738 kg)  12/29/14 179 lb  3.2 oz (81.285 kg)     Past Medical History  Diagnosis Date  . Hyperlipidemia   . Hypertension     a. Has previously refused blood pressure meds.   . Deaf     Divorced from husband but lives with him. Has daughter but she does not care for  her.  . Bilateral leg edema     a. Chronic.  Marland Kitchen Psychiatric disorder     She frequently exhibits paranoia and has been diagnosed with psychotic d/o NOS during [name] stay in the past. She is tangetial and perseverative during her visits. She apparently has had a bad experience with mental health in [place] in the past and refuses to discuss mental issues for fear that she will be sent back there. Her paranoia, communication issues, financial woes and lack of fam  . Fibroid uterus   . Anemia     Due to fibroids.  . Heart murmur   . Diabetes mellitus     a. Per PCP note 2012 (A1C 9.2) - pt unwilling to take meds and was educated on risk of uncontrolled DM.  b. A1C 5.9 in 11/2013.  Marland Kitchen Coronary artery disease   . MI (myocardial infarction)   . Blind   . ESRD on hemodialysis     Mon, Wed, Fri    Current Outpatient Prescriptions  Medication Sig Dispense Refill  . acetaminophen (TYLENOL) 325 MG tablet Take 650 mg by mouth 3 (three) times daily as needed.    . Amino Acids-Protein Hydrolys (FEEDING SUPPLEMENT, PRO-STAT SUGAR FREE 64,) LIQD Take 30 mLs by mouth 3 (three) times daily with meals. 900 mL 0  . amiodarone (PACERONE) 200 MG tablet Take 200 mg by mouth daily.    Marland Kitchen aspirin 81 MG tablet Take 81 mg by mouth daily. For hypertension    . atorvastatin (LIPITOR) 10 MG tablet Take 10 mg by mouth daily.    . calcium acetate (PHOSLO) 667 MG capsule Take 1 capsule (667 mg total) by mouth 3 (three) times daily with meals. 30 capsule 1  . clopidogrel (PLAVIX) 75 MG tablet Take 1 tablet (75 mg total) by mouth daily.    [name] Sodium (DSS) 100 MG CAPS Take 100 mg by mouth 2 (two) times daily. For constipation    . guaiFENesin (MUCINEX) 600 MG 12 hr tablet Take 1 tablet (600 mg total) by mouth 2 (two) times daily.    Marland Kitchen lactulose (CHRONULAC) 10 GM/15ML solution Take 30 mLs (20 g total) by mouth 2 (two) times daily as needed for moderate constipation. 240 mL 0  . levalbuterol (XOPENEX) 0.63 MG/3ML  nebulizer solution Take 3 mLs (0.63 mg total) by nebulization every 2 (two) hours as needed for wheezing or shortness of breath. 3 mL 12  . lisinopril (PRINIVIL,ZESTRIL) 2.5 MG tablet Take 1 tablet (2.5 mg total) by mouth daily.    . metoprolol succinate (TOPROL-XL) 50 MG 24 hr tablet Take 1 tablet (50 mg total) by mouth daily. Take with or immediately following a meal.    . multivitamin (RENA-VIT) TABS tablet Take 1 tablet by mouth at bedtime. 30 tablet 0  . nitroGLYCERIN (NITROSTAT) 0.4 MG SL tablet Place 1 tablet (0.4 mg total) under the tongue every 5 (five) minutes as needed for chest pain.  12   No current facility-administered medications for this visit.    Allergies:   No Known Allergies  Social History:  The patient  reports that she has never smoked. She does not have any  smokeless tobacco history on file. She reports that she does not drink alcohol or use illicit drugs.   Family history:   Family History  Problem Relation Age of Onset  . Other Father     Drowned from fishing?  . Heart disease      ROS:  Please see the history of present illness.  All other systems reviewed and negative.   PHYSICAL EXAM: VS:  BP 120/70 mmHg  Pulse 88  Ht _0  (1.727 m)  Wt 181 lb (82.101 kg)  BMI 27.53 kg/m2 Well nourished, well developed, in no acute distress.  Patient is sitting in wheelchair. Respiratory rate is normal and she appears comfortable HEENT:  extraocular movements are intact.  Neck: No cervical lymphadenopathy. Cardiac: Regular rate and rhythm with 1/6 sys murmur.  No rubs or gallops. Lungs:  clear to auscultation bilaterally, no wheezing, rhonchi or rales Abd: soft, nontender, positive bowel sounds all quadrants,  Ext: no lower extremity edema.  2+ right radial and dorsalis pedis pulses. Skin: warm and dry Neuro:  Grossly normal  EKG:    Sinus rhythm rate 71 bpm inferior and anterior Q waves  ASSESSMENT AND PLAN:  Problem List Items Addressed This Visit    HLD  (hyperlipidemia) (Chronic)   Essential hypertension (Chronic)   Deaf mutism, congenital - Primary   NSTEMI (non-ST elevated myocardial infarction)   Anterior wall infarct-(out of hopsital)   Cardiomyopathy, ischemic-EF 30-35%   history of Sustained VT (ventricular tachycardia)     PLAN: 1. She appears euvolemic on her current medications. There is no recurrence of chest pain or ventricular arrhythmias that were aware of. It's important that were her DO NOT RESUSCITATE status if she were to have any further VT or other events. I would not pursue hospitalization and we are will not perform catheterization. Plan to continue medical therapy for now. I will see her back in 6 months.  Pixie Casino, MD, [name] Attending Cardiologist [place]

## 2015-02-11 ENCOUNTER — Non-Acute Institutional Stay (SKILLED_NURSING_FACILITY): Payer: Medicare Other | Admitting: Adult Health

## 2015-02-11 DIAGNOSIS — Z992 Dependence on renal dialysis: Secondary | ICD-10-CM

## 2015-02-11 DIAGNOSIS — I214 Non-ST elevation (NSTEMI) myocardial infarction: Secondary | ICD-10-CM | POA: Diagnosis not present

## 2015-02-11 DIAGNOSIS — I10 Essential (primary) hypertension: Secondary | ICD-10-CM

## 2015-02-11 DIAGNOSIS — N186 End stage renal disease: Secondary | ICD-10-CM

## 2015-02-11 DIAGNOSIS — D631 Anemia in chronic kidney disease: Secondary | ICD-10-CM

## 2015-02-11 DIAGNOSIS — E785 Hyperlipidemia, unspecified: Secondary | ICD-10-CM

## 2015-02-11 DIAGNOSIS — N189 Chronic kidney disease, unspecified: Secondary | ICD-10-CM | POA: Diagnosis not present

## 2015-02-16 DIAGNOSIS — I871 Compression of vein: Secondary | ICD-10-CM | POA: Diagnosis not present

## 2015-02-16 DIAGNOSIS — N186 End stage renal disease: Secondary | ICD-10-CM | POA: Diagnosis not present

## 2015-02-16 DIAGNOSIS — Z992 Dependence on renal dialysis: Secondary | ICD-10-CM | POA: Diagnosis not present

## 2015-02-16 DIAGNOSIS — T82858D Stenosis of vascular prosthetic devices, implants and grafts, subsequent encounter: Secondary | ICD-10-CM | POA: Diagnosis not present

## 2015-02-20 IMAGING — CR DG CHEST 2V
2 series · 2 of 2 positions shown · non-contrast
Comparison: 02/02/2007

CLINICAL DATA: Irregular heartbeat, elevated blood pressure

EXAM:
CHEST  2 VIEW

[w chest lat]
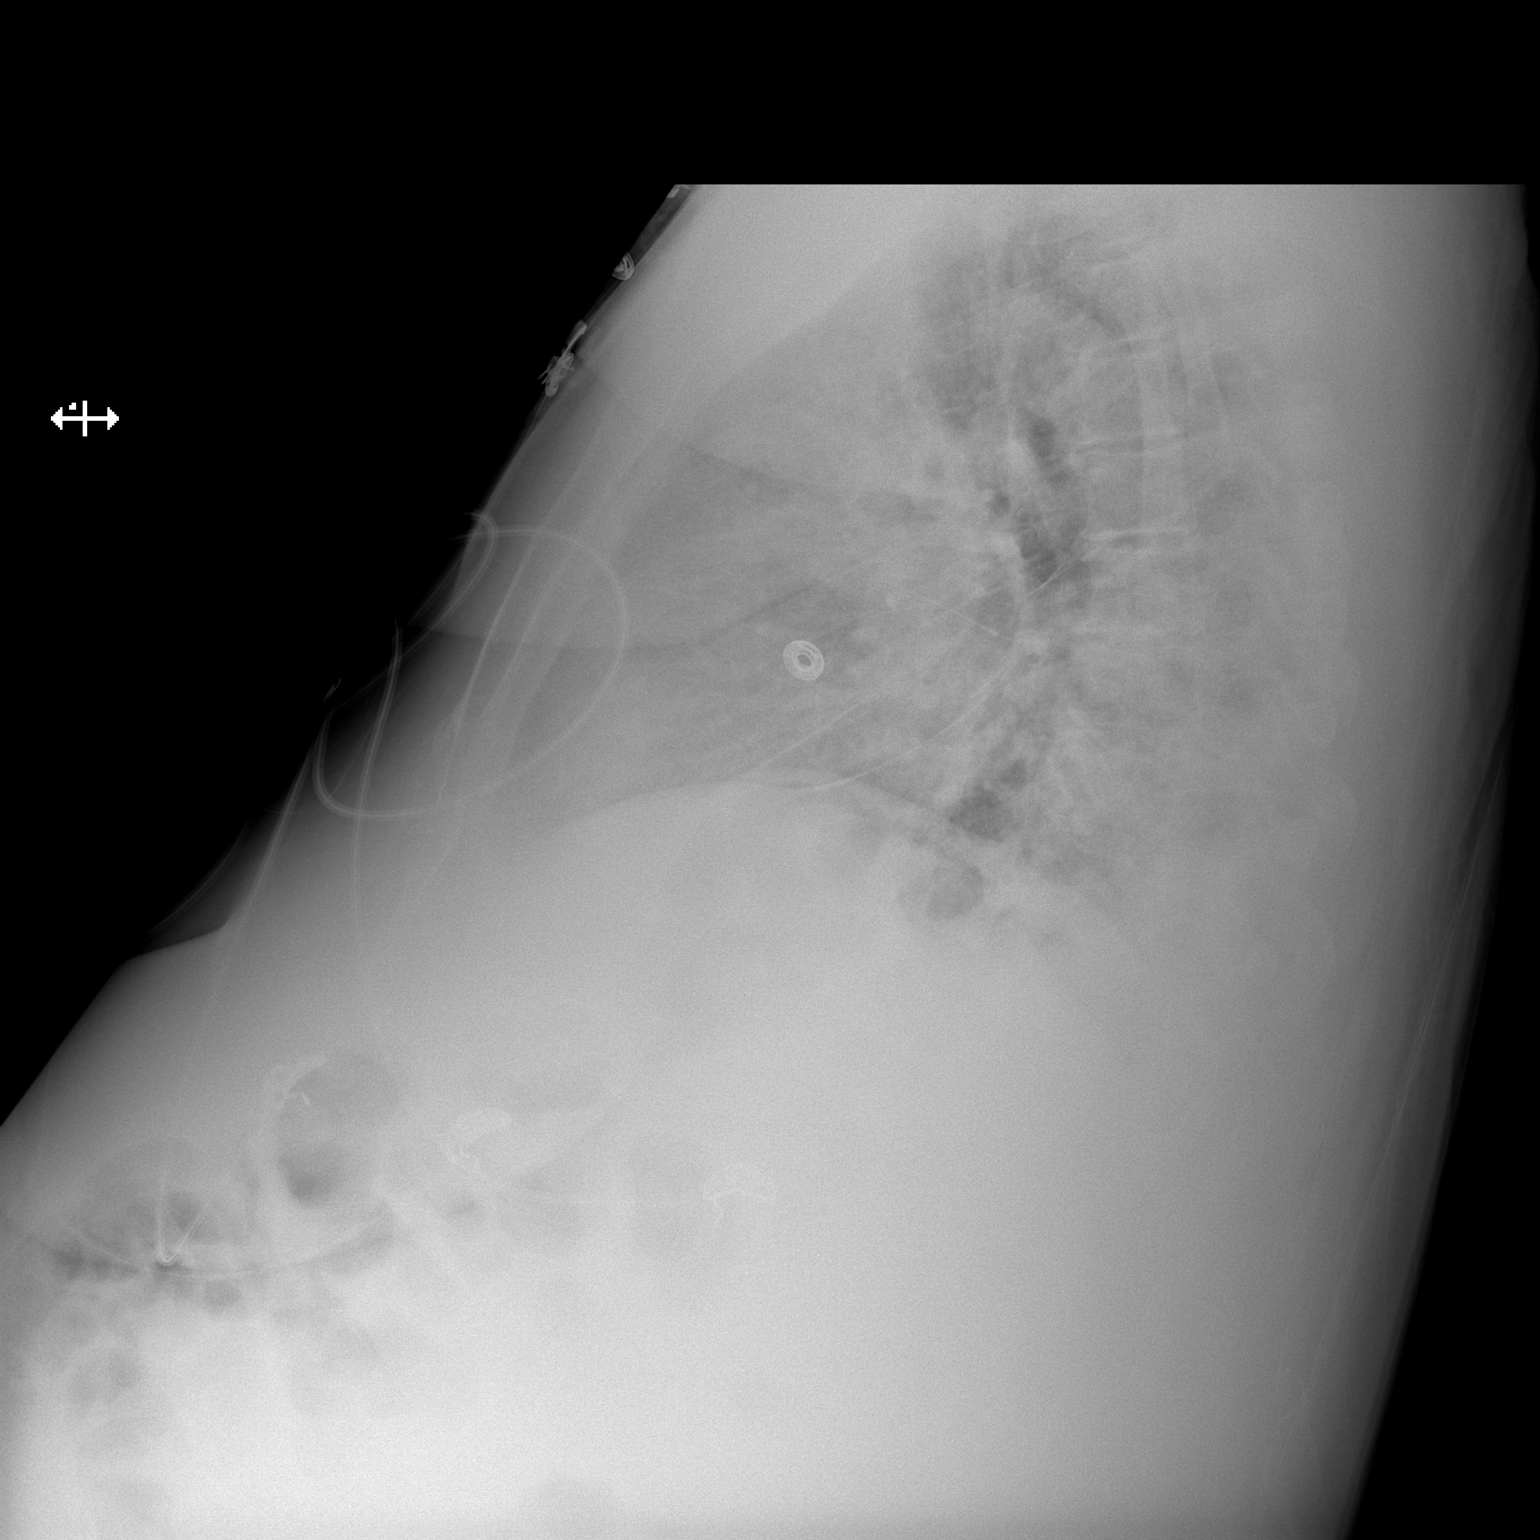

[x chest ap]
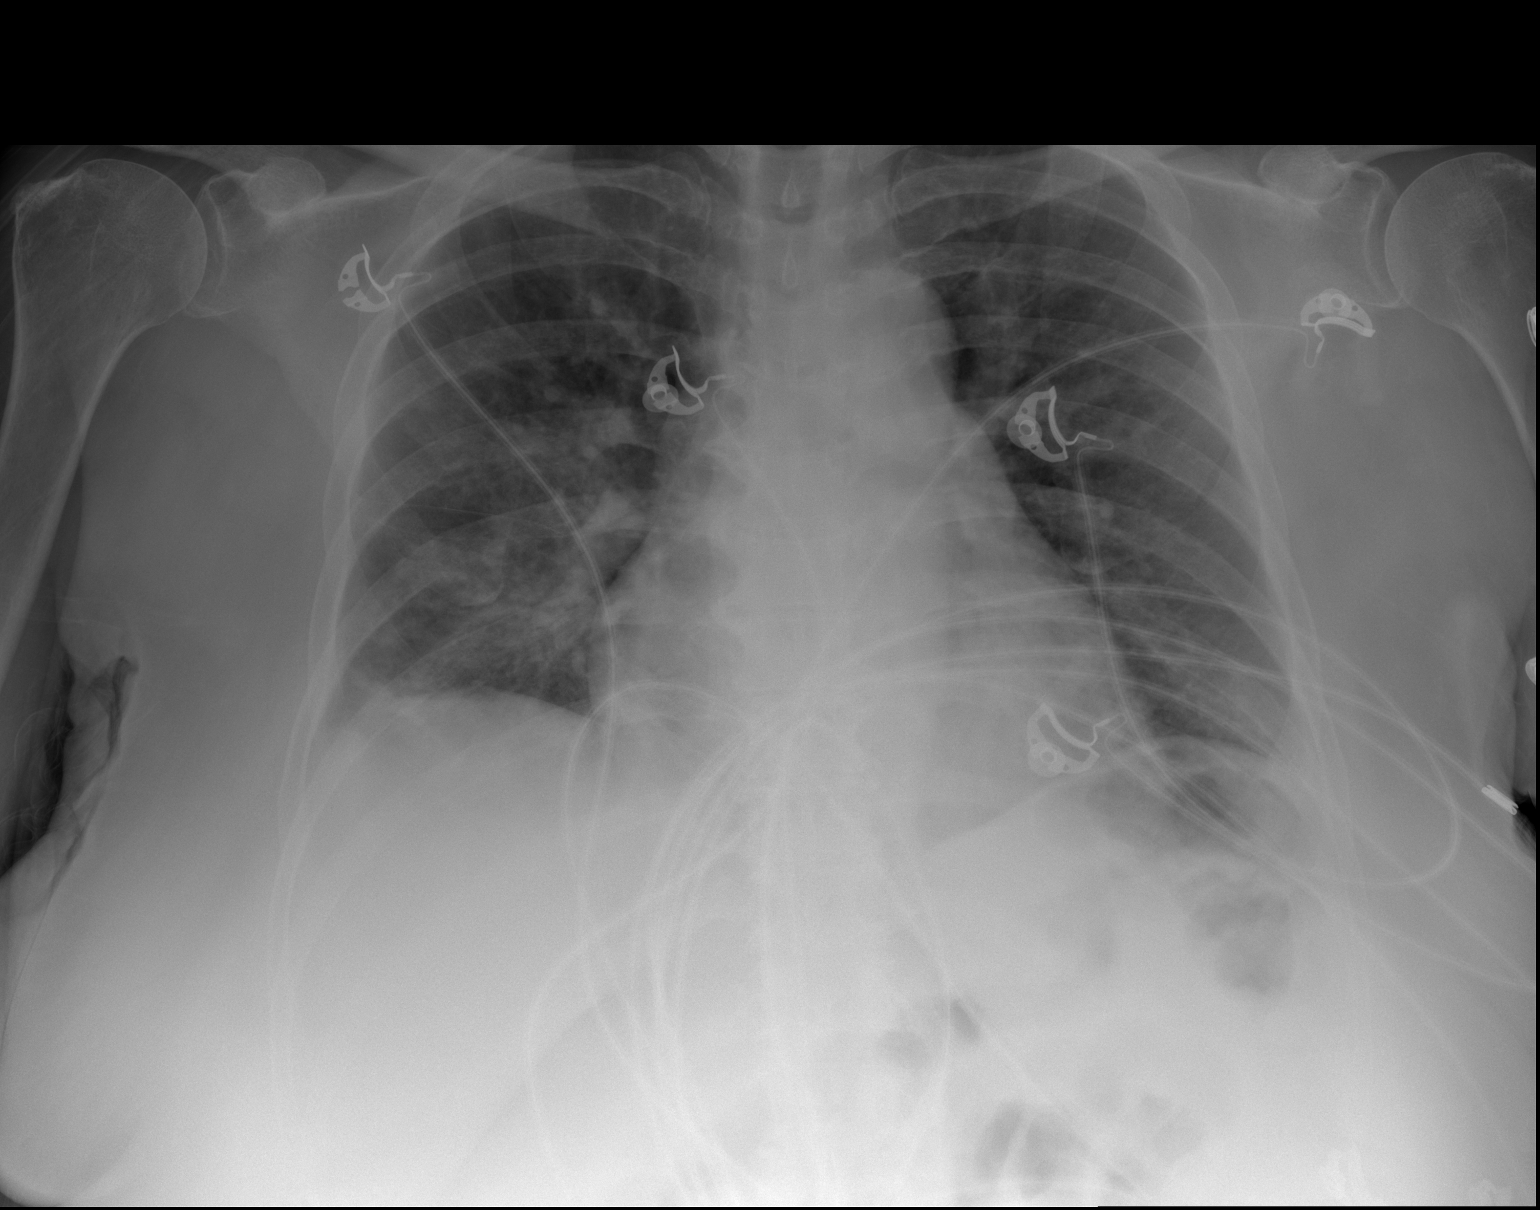

[2 of 2 positions shown; findings below may reference images not displayed]

FINDINGS: There is right perihilar airspace disease concerning for pneumonia.
There is no other focal parenchymal opacity, pleural effusion, or
pneumothorax. The heart and mediastinal contours are unremarkable.

The osseous structures are unremarkable.
IMPRESSION: Right perihilar airspace disease concerning for pneumonia. Recommend
followup radiography in 4-6 weeks, to document complete resolution
following adequate medical therapy. If there is not complete
resolution, then recommend further evaluation with CT of the chest
to exclude underlying pathology.

## 2015-02-21 ENCOUNTER — Non-Acute Institutional Stay (SKILLED_NURSING_FACILITY): Payer: Medicare Other | Admitting: Internal Medicine

## 2015-02-21 ENCOUNTER — Encounter: Payer: Self-pay | Admitting: Internal Medicine

## 2015-02-21 DIAGNOSIS — I10 Essential (primary) hypertension: Secondary | ICD-10-CM | POA: Diagnosis not present

## 2015-02-21 DIAGNOSIS — H9190 Unspecified hearing loss, unspecified ear: Secondary | ICD-10-CM | POA: Diagnosis not present

## 2015-02-21 DIAGNOSIS — L97519 Non-pressure chronic ulcer of other part of right foot with unspecified severity: Secondary | ICD-10-CM

## 2015-02-21 DIAGNOSIS — Z7382 Dual sensory impairment: Secondary | ICD-10-CM

## 2015-02-21 DIAGNOSIS — H540 Blindness, both eyes: Secondary | ICD-10-CM

## 2015-02-21 DIAGNOSIS — N186 End stage renal disease: Secondary | ICD-10-CM | POA: Diagnosis not present

## 2015-02-21 DIAGNOSIS — E1322 Other specified diabetes mellitus with diabetic chronic kidney disease: Secondary | ICD-10-CM | POA: Diagnosis not present

## 2015-02-21 DIAGNOSIS — R627 Adult failure to thrive: Secondary | ICD-10-CM | POA: Diagnosis not present

## 2015-02-21 DIAGNOSIS — H547 Unspecified visual loss: Secondary | ICD-10-CM

## 2015-02-21 DIAGNOSIS — E1122 Type 2 diabetes mellitus with diabetic chronic kidney disease: Secondary | ICD-10-CM

## 2015-02-21 IMAGING — US US RENAL
1 series · 14 of 25 positions shown · non-contrast
Comparison: None.

ADDENDUM:
The right kidney length in the findings section is incorrectly given
as 5.2 cm. The correct length of the right kidney is 11.2 cm.
CLINICAL DATA: Acute renal failure

EXAM:
RENAL/URINARY TRACT ULTRASOUND COMPLETE

[Series 1: us renal · 0.22mm/px · 14 of 37 slices shown]
[im 1/37]
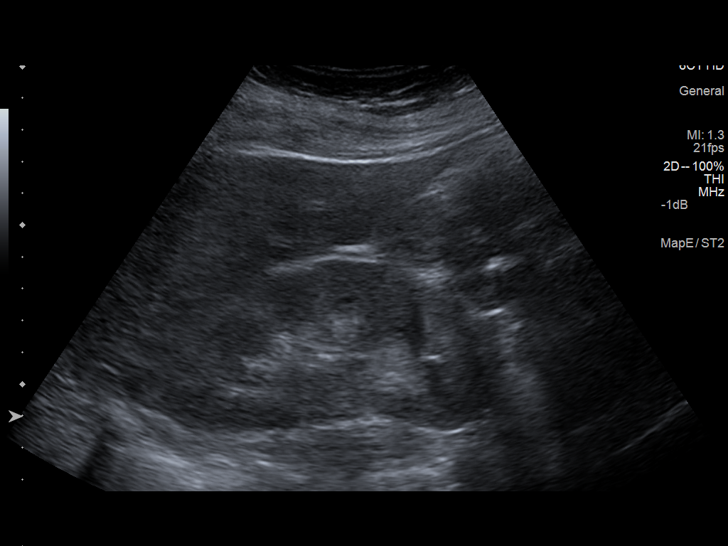
[im 4/37]
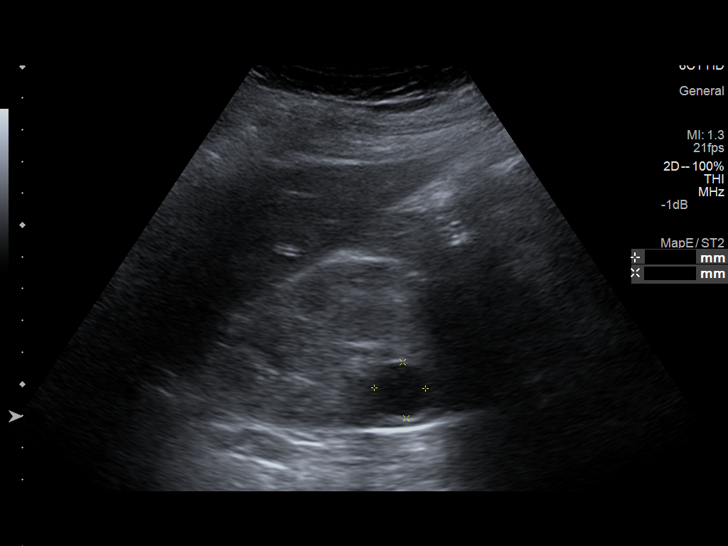
[im 7/37]
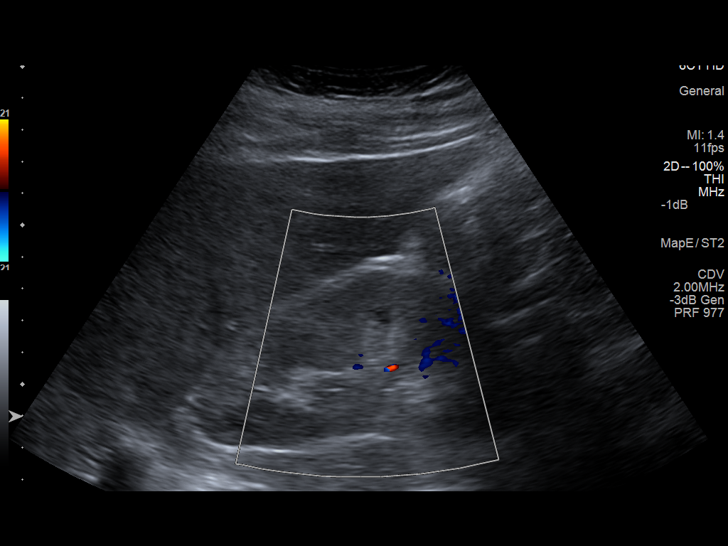
[im 10/37]
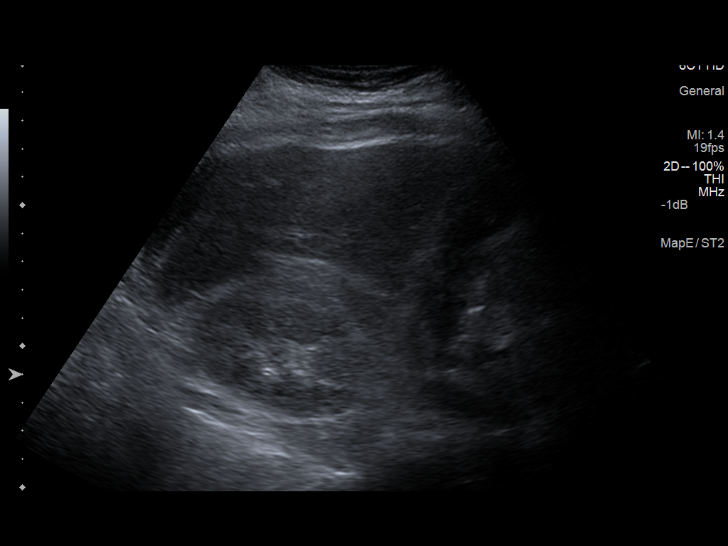
[im 13/37]
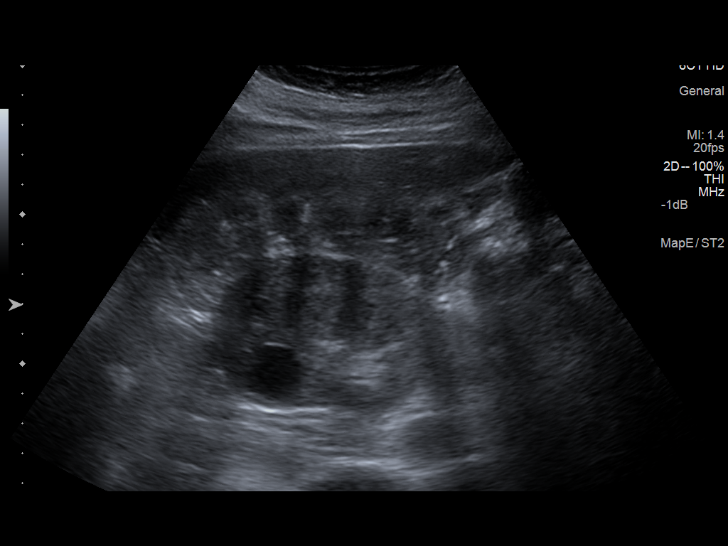
[im 14/37]
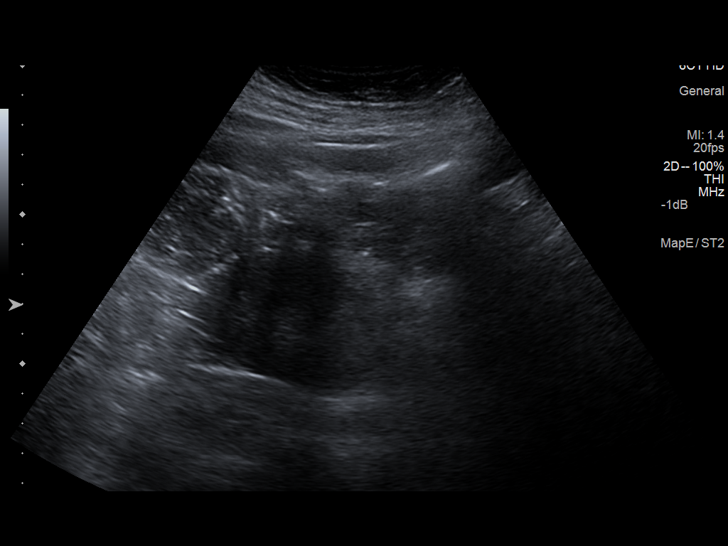
[im 17/37]
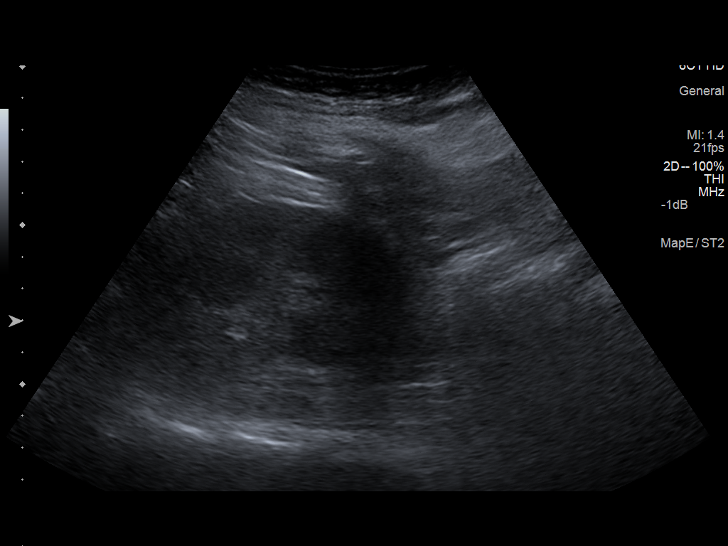
[im 20/37]
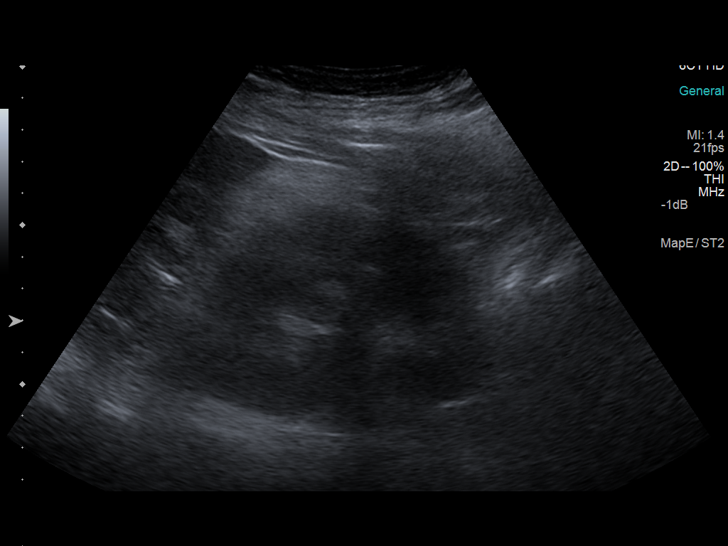
[im 23/37]
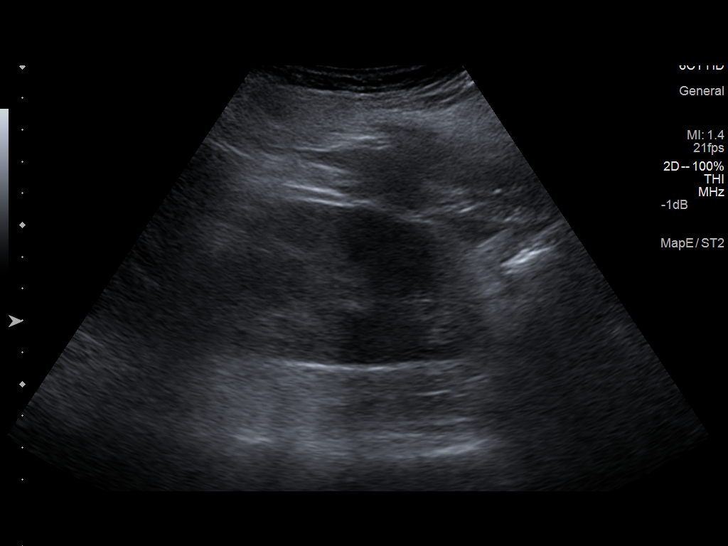
[im 25/37]
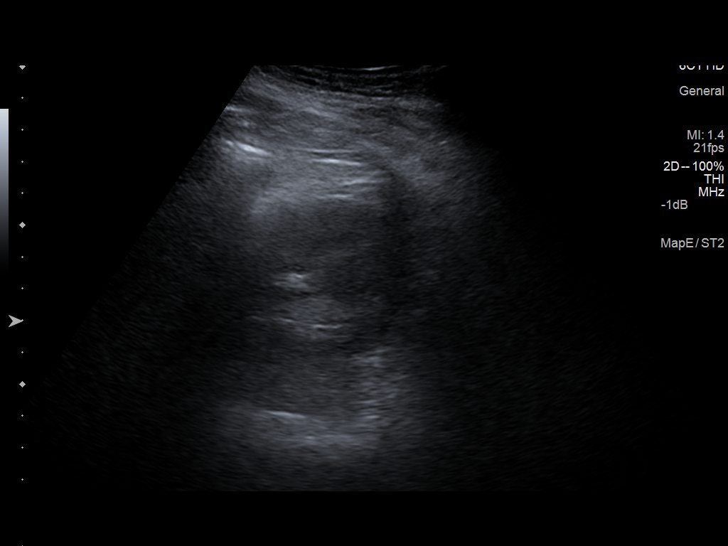
[im 28/37]
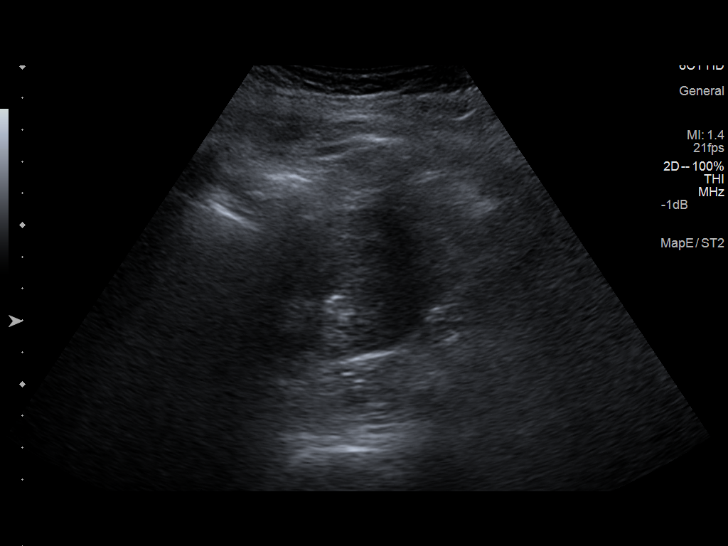
[im 31/37]
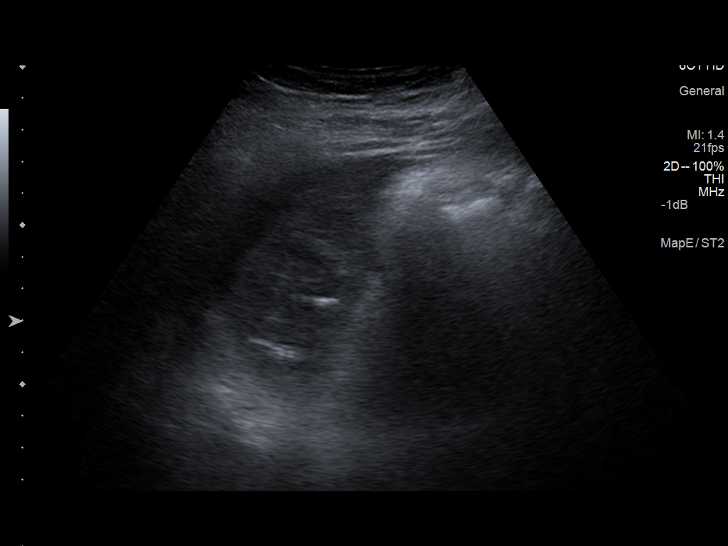
[im 34/37]
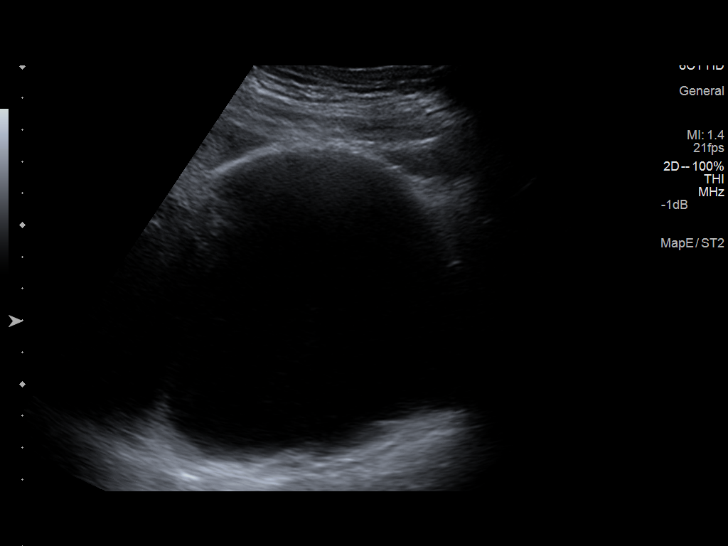
[im 37/37]
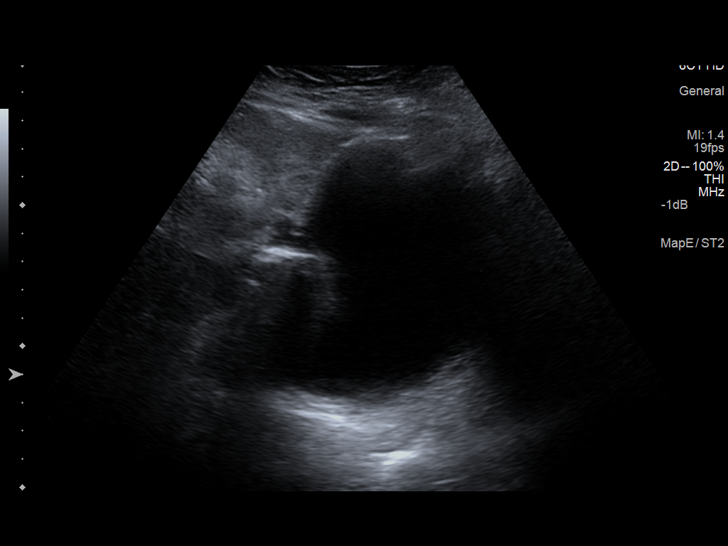

[14 of 25 positions shown; findings below may reference images not displayed]

FINDINGS: Right Kidney:

Length: 5.2 cm. There is increased renal cortical echogenicity.
There is a 1.6 x 1.8 x 2 cm anechoic right renal mass most
consistent with a cyst.

Left Kidney:

Length: 10.9 cm. There is increased renal cortical echogenicity. No
mass or hydronephrosis visualized.

Bladder:

Appears normal for degree of bladder distention.

Incidental note are made of splenic punctate hyperechoic foci likely
reflecting calcifications from prior granulomatous disease. There is
a small amount of perisplenic fluid.
IMPRESSION: 1. No obstructive uropathy.

2. Increased bilateral renal cortical echogenicity as can be seen
with medical renal disease.

3.  Small amount of nonspecific perisplenic fluid.

## 2015-02-21 NOTE — Progress Notes (Signed)
Patient ID: [name], female   DOB: May 15, 1956, 59 y.o.   MRN: [id number]    HISTORY AND PHYSICAL  Location:    [facility]   Place of Service:   SNF   Extended Emergency Contact Information Primary Emergency Contact: Posthumus,[name] Address: [place], [place] [address] Johnnette Litter of [place] Phone: [phone] Relation: Son Secondary Emergency Contact: [name] of [place] Phone: [phone] Mobile Phone: [phone] Relation: Sister  Advanced Directive information   DNR  Chief Complaint  Patient presents with  . Readmit To SNF    suspected acte bronchitis, hx sustained Vtach, CAD/EF 30-35%, questionable FTT, HTN, DM II (A1c 6%), right 1st toe ulcer, ESRD/HD MWF    HPI:  59 yo female [facility] term resident seen today for readmission into SNF for above. She is deaf/blind and mute. She does communicate by writing on forearm. She is c/a her right 1st toe and has significant pain in it. Her left wrist is better but still has pain. No abdominal pain. She is eating and sleeping well. No nursing issues. Unable to obtain detailed HPI due to pt's inability to speak, hear or see  Past Medical History  Diagnosis Date  . Hyperlipidemia   . Hypertension     a. Has previously refused blood pressure meds.   . Deaf     Divorced from husband but lives with him. Has daughter but she does not care for her.  . Bilateral leg edema     a. Chronic.  Marland Kitchen Psychiatric disorder     She frequently exhibits paranoia and has been diagnosed with psychotic d/o NOS during hospital stay in the past. She is tangetial and perseverative during her visits. She apparently has had a bad experience with mental health in [place] in the past and refuses to discuss mental issues for fear that she will be sent back there. Her paranoia, communication issues, financial woes and lack of fam  . Fibroid uterus   . Anemia     Due to fibroids.   . Heart murmur   . Diabetes mellitus     a. Per PCP note 2012 (A1C 9.2) - pt unwilling to take meds and was educated on risk of uncontrolled DM.  b. A1C 5.9 in 11/2013.  Marland Kitchen Coronary artery disease   . MI (myocardial infarction)   . Blind   . ESRD on hemodialysis     Mon, Wed, Fri    Past Surgical History  Procedure Laterality Date  . No past surgeries    . Insertion of dialysis catheter Right 01/02/2014    Procedure: INSERTION OF DIALYSIS CATHETER;  Surgeon: Rosetta Posner, MD;  Location: [facility];  Service: Vascular;  Laterality: Right;  . Av fistula placement Left 01/02/2014    Procedure: ARTERIOVENOUS (AV) FISTULA CREATION;  Surgeon: Rosetta Posner, MD;  Location: [facility];  Service: Vascular;  Laterality: Left;    Patient Care Team: Shea Evans, DO as PCP - General (Internal Medicine)  History   Social History  . Marital Status: Married    Spouse Name: N/A  . Number of Children: N/A  . Years of Education: N/A   Occupational History  . Not on file.   Social History Main Topics  . Smoking status: Never Smoker   . Smokeless tobacco: Not on file  Comment: Prior dip  . Alcohol Use: No  . Drug Use: No  . Sexual Activity: Not on file   Other Topics Concern  . Not on file   Social History Narrative     reports that she has never smoked. She does not have any smokeless tobacco history on file. She reports that she does not drink alcohol or use illicit drugs.  Family History  Problem Relation Age of Onset  . Other Father     Drowned from fishing?  . Heart disease     Family Status  Relation Status Death Age  . Mother Deceased     Unknown  . Father Deceased     Immunization History  Administered Date(s) Administered  . Influenza,inj,Quad PF,36+ Mos 01/03/2014  . PPD Test 07/28/2014  . Pneumococcal Polysaccharide-23 01/03/2014   Past medical, surgical, family and social history reviewed  No Known Allergies  Medications: Patient's Medications  New  Prescriptions   No medications on file  Previous Medications   ACETAMINOPHEN (TYLENOL) 325 MG TABLET    Take 650 mg by mouth 3 (three) times daily as needed.   AMINO ACIDS-PROTEIN HYDROLYS (FEEDING SUPPLEMENT, PRO-STAT SUGAR FREE 64,) LIQD    Take 30 mLs by mouth 3 (three) times daily with meals.   AMIODARONE (PACERONE) 200 MG TABLET    Take 200 mg by mouth daily.   ASPIRIN 81 MG TABLET    Take 81 mg by mouth daily. For hypertension   ATORVASTATIN (LIPITOR) 10 MG TABLET    Take 10 mg by mouth daily.   CALCIUM ACETATE (PHOSLO) 667 MG CAPSULE    Take 1 capsule (667 mg total) by mouth 3 (three) times daily with meals.   CLOPIDOGREL (PLAVIX) 75 MG TABLET    Take 1 tablet (75 mg total) by mouth daily.   DOCUSATE SODIUM (DSS) 100 MG CAPS    Take 100 mg by mouth 2 (two) times daily. For constipation   GUAIFENESIN (MUCINEX) 600 MG 12 HR TABLET    Take 1 tablet (600 mg total) by mouth 2 (two) times daily.   LACTULOSE (CHRONULAC) 10 GM/15ML SOLUTION    Take 30 mLs (20 g total) by mouth 2 (two) times daily as needed for moderate constipation.   LEVALBUTEROL (XOPENEX) 0.63 MG/3ML NEBULIZER SOLUTION    Take 3 mLs (0.63 mg total) by nebulization every 2 (two) hours as needed for wheezing or shortness of breath.   LISINOPRIL (PRINIVIL,ZESTRIL) 2.5 MG TABLET    Take 1 tablet (2.5 mg total) by mouth daily.   METOPROLOL SUCCINATE (TOPROL-XL) 50 MG 24 HR TABLET    Take 1 tablet (50 mg total) by mouth daily. Take with or immediately following a meal.   MULTIVITAMIN (RENA-VIT) TABS TABLET    Take 1 tablet by mouth at bedtime.   NITROGLYCERIN (NITROSTAT) 0.4 MG SL TABLET    Place 1 tablet (0.4 mg total) under the tongue every 5 (five) minutes as needed for chest pain.  Modified Medications   No medications on file  Discontinued Medications   No medications on file    Review of Systems  Unable to perform ROS: Patient nonverbal    Filed Vitals:   02/04/15 2131  BP: 126/61  Pulse: 74   There is no weight on  file to calculate BMI.  Physical Exam  Constitutional: She appears well-developed and well-nourished.  HENT:  Mouth/Throat: Oropharynx is clear and moist.  Neck: Neck supple. No tracheal deviation present. No thyromegaly present.  Cardiovascular: Normal  rate, regular rhythm, normal heart sounds and intact distal pulses.  Exam reveals no gallop and no friction rub.   No murmur heard. No LE edema b/l. No calf TTP  Pulmonary/Chest: Effort normal and breath sounds normal. No stridor. No respiratory distress. She has no wheezes. She has no rales.  Abdominal: Soft. Bowel sounds are normal. She exhibits no distension and no mass. There is no tenderness. There is no rebound and no guarding.  Musculoskeletal:  Left wrist brace intact  Lymphadenopathy:    She has no cervical adenopathy.  Neurological: She is alert.  Skin: Skin is warm and dry. No rash noted.  Bandage intact on right 1st toe, c/d/i.  Psychiatric: She has a normal mood and affect. Her behavior is normal. Judgment and thought content normal.     Labs reviewed: Admission on 01/13/2015, Discharged on 01/16/2015  Component Date Value Ref Range Status  . Lactic Acid, Venous 01/13/2015 1.29  0.5 - 2.2 mmol/L Final  . Sodium 01/13/2015 133* 135 - 145 mmol/L Final   Please note change in reference range.  . Potassium 01/13/2015 4.8  3.5 - 5.1 mmol/L Final   Please note change in reference range.  . Chloride 01/13/2015 93* 96 - 112 mEq/L Final  . CO2 01/13/2015 27  19 - 32 mmol/L Final  . Glucose, Bld 01/13/2015 111* 70 - 99 mg/dL Final  . BUN 01/13/2015 55* 6 - 23 mg/dL Final  . Creatinine, Ser 01/13/2015 5.96* 0.50 - 1.10 mg/dL Final  . Calcium 01/13/2015 9.1  8.4 - 10.5 mg/dL Final  . GFR calc non Af Amer 01/13/2015 7* >90 mL/min Final  . GFR calc Af Amer 01/13/2015 8* >90 mL/min Final   Comment: (NOTE) The eGFR has been calculated using the CKD EPI equation. This calculation has not been validated in all clinical  situations. eGFR's persistently <90 mL/min signify possible Chronic Kidney Disease.   . Anion gap 01/13/2015 13  5 - 15 Final  . WBC 01/13/2015 5.6  4.0 - 10.5 K/uL Final  . RBC 01/13/2015 3.28* 3.87 - 5.11 MIL/uL Final  . Hemoglobin 01/13/2015 10.4* 12.0 - 15.0 g/dL Final  . HCT 01/13/2015 31.8* 36.0 - 46.0 % Final  . MCV 01/13/2015 97.0  78.0 - 100.0 fL Final  . MCH 01/13/2015 31.7  26.0 - 34.0 pg Final  . MCHC 01/13/2015 32.7  30.0 - 36.0 g/dL Final  . RDW 01/13/2015 15.4  11.5 - 15.5 % Final  . Platelets 01/13/2015 258  150 - 400 K/uL Final  . Neutrophils Relative % 01/13/2015 55  43 - 77 % Final  . Neutro Abs 01/13/2015 3.0  1.7 - 7.7 K/uL Final  . Lymphocytes Relative 01/13/2015 25  12 - 46 % Final  . Lymphs Abs 01/13/2015 1.4  0.7 - 4.0 K/uL Final  . Monocytes Relative 01/13/2015 14* 3 - 12 % Final  . Monocytes Absolute 01/13/2015 0.8  0.1 - 1.0 K/uL Final  . Eosinophils Relative 01/13/2015 5  0 - 5 % Final  . Eosinophils Absolute 01/13/2015 0.3  0.0 - 0.7 K/uL Final  . Basophils Relative 01/13/2015 1  0 - 1 % Final  . Basophils Absolute 01/13/2015 0.1  0.0 - 0.1 K/uL Final  . Specimen Description 01/13/2015 URINE, CLEAN CATCH   Final  . Special Requests 01/13/2015 NONE   Final  . Colony Count 01/13/2015    Final  Value:NO GROWTH Performed at [facility]   . Culture 01/13/2015    Final                   Value:NO GROWTH Performed at [facility]   . Report Status 01/13/2015 01/14/2015 FINAL   Final  . Color, Urine 01/13/2015 AMBER* YELLOW Final   BIOCHEMICALS MAY BE AFFECTED BY COLOR  . APPearance 01/13/2015 CLOUDY* CLEAR Final  . Specific Gravity, Urine 01/13/2015 1.020  1.005 - 1.030 Final  . pH 01/13/2015 5.0  5.0 - 8.0 Final  . Glucose, UA 01/13/2015 NEGATIVE  NEGATIVE mg/dL Final  . Hgb urine dipstick 01/13/2015 NEGATIVE  NEGATIVE Final  . Bilirubin Urine 01/13/2015 SMALL* NEGATIVE Final  . Ketones, ur 01/13/2015 15* NEGATIVE  mg/dL Final  . Protein, ur 01/13/2015 100* NEGATIVE mg/dL Final  . Urobilinogen, UA 01/13/2015 0.2  0.0 - 1.0 mg/dL Final  . Nitrite 01/13/2015 NEGATIVE  NEGATIVE Final  . Leukocytes, UA 01/13/2015 TRACE* NEGATIVE Final  . Troponin i, poc 01/13/2015 0.01  0.00 - 0.08 ng/mL Final  . Comment 3 01/13/2015          Final   Comment: Due to the release kinetics of cTnI, a negative result within the first hours of the onset of symptoms does not rule out myocardial infarction with certainty. If myocardial infarction is still suspected, repeat the test at appropriate intervals.   . B Natriuretic Peptide 01/13/2015 4164.6* 0.0 - 100.0 pg/mL Final   Please note change in reference range.  . Squamous Epithelial / LPF 01/13/2015 FEW* RARE Final  . WBC, UA 01/13/2015 3-6  <3 WBC/hpf Final  . RBC / HPF 01/13/2015 0-2  <3 RBC/hpf Final  . Bacteria, UA 01/13/2015 RARE  RARE Final  . Specimen Description 01/13/2015 BLOOD RIGHT ARM   Final  . Special Requests 01/13/2015 BOTTLES DRAWN AEROBIC ONLY 10CC   Final  . Culture 01/13/2015    Final                   Value:NO GROWTH 5 DAYS Performed at [facility]   . Report Status 01/13/2015 01/20/2015 FINAL   Final  . Specimen Description 01/13/2015 BLOOD RIGHT HAND   Final  . Special Requests 01/13/2015 BOTTLES DRAWN AEROBIC ONLY 10CC   Final  . Culture 01/13/2015    Final                   Value:NO GROWTH 5 DAYS Performed at [facility]   . Report Status 01/13/2015 01/20/2015 FINAL   Final  . WBC 01/13/2015 5.0  4.0 - 10.5 K/uL Final  . RBC 01/13/2015 3.37* 3.87 - 5.11 MIL/uL Final  . Hemoglobin 01/13/2015 10.7* 12.0 - 15.0 g/dL Final  . HCT 01/13/2015 32.6* 36.0 - 46.0 % Final  . MCV 01/13/2015 96.7  78.0 - 100.0 fL Final  . MCH 01/13/2015 31.8  26.0 - 34.0 pg Final  . MCHC 01/13/2015 32.8  30.0 - 36.0 g/dL Final  . RDW 01/13/2015 15.4  11.5 - 15.5 % Final  . Platelets 01/13/2015 251  150 - 400 K/uL Final  . Creatinine, Ser  01/13/2015 6.39* 0.50 - 1.10 mg/dL Final  . GFR calc non Af Amer 01/13/2015 6* >90 mL/min Final  . GFR calc Af Amer 01/13/2015 7* >90 mL/min Final   Comment: (NOTE) The eGFR has been calculated using the CKD EPI equation. This calculation has not been validated in all clinical situations. eGFR's persistently <  90 mL/min signify possible Chronic Kidney Disease.   . Sodium 01/14/2015 135  135 - 145 mmol/L Final   Please note change in reference range.  . Potassium 01/14/2015 3.9  3.5 - 5.1 mmol/L Final   Comment: Please note change in reference range. DELTA CHECK NOTED   . Chloride 01/14/2015 96  96 - 112 mEq/L Final  . CO2 01/14/2015 29  19 - 32 mmol/L Final  . Glucose, Bld 01/14/2015 91  70 - 99 mg/dL Final  . BUN 01/14/2015 22  6 - 23 mg/dL Final   DELTA CHECK NOTED  . Creatinine, Ser 01/14/2015 3.89* 0.50 - 1.10 mg/dL Final   DIALYSIS  . Calcium 01/14/2015 8.7  8.4 - 10.5 mg/dL Final  . GFR calc non Af Amer 01/14/2015 12* >90 mL/min Final  . GFR calc Af Amer 01/14/2015 14* >90 mL/min Final   Comment: (NOTE) The eGFR has been calculated using the CKD EPI equation. This calculation has not been validated in all clinical situations. eGFR's persistently <90 mL/min signify possible Chronic Kidney Disease.   . Anion gap 01/14/2015 10  5 - 15 Final  . WBC 01/14/2015 3.8* 4.0 - 10.5 K/uL Final  . RBC 01/14/2015 3.19* 3.87 - 5.11 MIL/uL Final  . Hemoglobin 01/14/2015 10.3* 12.0 - 15.0 g/dL Final  . HCT 01/14/2015 31.8* 36.0 - 46.0 % Final  . MCV 01/14/2015 99.7  78.0 - 100.0 fL Final  . MCH 01/14/2015 32.3  26.0 - 34.0 pg Final  . MCHC 01/14/2015 32.4  30.0 - 36.0 g/dL Final  . RDW 01/14/2015 15.3  11.5 - 15.5 % Final  . Platelets 01/14/2015 229  150 - 400 K/uL Final  . Influenza A By PCR 01/13/2015 NEGATIVE  NEGATIVE Final  . Influenza B By PCR 01/13/2015 NEGATIVE  NEGATIVE Final  . H1N1 flu by pcr 01/13/2015 NOT DETECTED  NOT DETECTED Final   Comment:        The Xpert Flu assay  (FDA approved for nasal aspirates or washes and nasopharyngeal swab specimens), is intended as an aid in the diagnosis of influenza and should not be used as a sole basis for treatment.   . Sodium 01/15/2015 129* 135 - 145 mmol/L Final  . Potassium 01/15/2015 4.1  3.5 - 5.1 mmol/L Final  . Chloride 01/15/2015 93* 96 - 112 mEq/L Final  . CO2 01/15/2015 25  19 - 32 mmol/L Final  . Glucose, Bld 01/15/2015 109* 70 - 99 mg/dL Final  . BUN 01/15/2015 49* 6 - 23 mg/dL Final   DELTA CHECK NOTED  . Creatinine, Ser 01/15/2015 6.03* 0.50 - 1.10 mg/dL Final   DELTA CHECK NOTED  . Calcium 01/15/2015 8.3* 8.4 - 10.5 mg/dL Final  . Total Protein 01/15/2015 6.0  6.0 - 8.3 g/dL Final  . Albumin 01/15/2015 2.8* 3.5 - 5.2 g/dL Final  . AST 01/15/2015 14  0 - 37 U/L Final  . ALT 01/15/2015 12  0 - 35 U/L Final  . Alkaline Phosphatase 01/15/2015 65  39 - 117 U/L Final  . Total Bilirubin 01/15/2015 0.5  0.3 - 1.2 mg/dL Final  . GFR calc non Af Amer 01/15/2015 7* >90 mL/min Final  . GFR calc Af Amer 01/15/2015 8* >90 mL/min Final   Comment: (NOTE) The eGFR has been calculated using the CKD EPI equation. This calculation has not been validated in all clinical situations. eGFR's persistently <90 mL/min signify possible Chronic Kidney Disease.   . Anion gap 01/15/2015 11  5 -  15 Final  . WBC 01/15/2015 4.7  4.0 - 10.5 K/uL Final  . RBC 01/15/2015 2.94* 3.87 - 5.11 MIL/uL Final  . Hemoglobin 01/15/2015 9.1* 12.0 - 15.0 g/dL Final  . HCT 01/15/2015 28.1* 36.0 - 46.0 % Final  . MCV 01/15/2015 95.6  78.0 - 100.0 fL Final  . MCH 01/15/2015 31.0  26.0 - 34.0 pg Final  . MCHC 01/15/2015 32.4  30.0 - 36.0 g/dL Final  . RDW 01/15/2015 14.8  11.5 - 15.5 % Final  . Platelets 01/15/2015 208  150 - 400 K/uL Final  . Sodium 01/16/2015 128* 135 - 145 mmol/L Final  . Potassium 01/16/2015 4.2  3.5 - 5.1 mmol/L Final  . Chloride 01/16/2015 92* 96 - 112 mmol/L Final  . CO2 01/16/2015 24  19 - 32 mmol/L Final  .  Glucose, Bld 01/16/2015 91  70 - 99 mg/dL Final  . BUN 01/16/2015 35* 6 - 23 mg/dL Final  . Creatinine, Ser 01/16/2015 4.63* 0.50 - 1.10 mg/dL Final  . Calcium 01/16/2015 8.8  8.4 - 10.5 mg/dL Final  . GFR calc non Af Amer 01/16/2015 10* >90 mL/min Final  . GFR calc Af Amer 01/16/2015 11* >90 mL/min Final   Comment: (NOTE) The eGFR has been calculated using the CKD EPI equation. This calculation has not been validated in all clinical situations. eGFR's persistently <90 mL/min signify possible Chronic Kidney Disease.   . Anion gap 01/16/2015 12  5 - 15 Final  . WBC 01/16/2015 3.9* 4.0 - 10.5 K/uL Final  . RBC 01/16/2015 3.52* 3.87 - 5.11 MIL/uL Final  . Hemoglobin 01/16/2015 11.1* 12.0 - 15.0 g/dL Final   REPEATED TO VERIFY  . HCT 01/16/2015 34.0* 36.0 - 46.0 % Final  . MCV 01/16/2015 96.6  78.0 - 100.0 fL Final  . MCH 01/16/2015 31.5  26.0 - 34.0 pg Final  . MCHC 01/16/2015 32.6  30.0 - 36.0 g/dL Final  . RDW 01/16/2015 14.7  11.5 - 15.5 % Final  . Platelets 01/16/2015 207  150 - 400 K/uL Final  . Glucose-Capillary 01/16/2015 90  70 - 99 mg/dL Final  . Glucose-Capillary 01/16/2015 95  70 - 99 mg/dL Final  Admission on 12/11/2014, Discharged on 12/21/2014  No results displayed because visit has over 200 results.    Admission on 11/19/2014, Discharged on 11/26/2014  No results displayed because visit has over 200 results.      No results found.  Chart reviewed  Assessment/Plan   ICD-9-CM ICD-10-CM   1. Ulcer of great toe, right, with unspecified severity  707.15 L97.519   2. Diabetes mellitus with ESRD (end-stage renal disease) - controlled; HD MWF 250.40 E13.22    585.6 N18.6   3. Essential hypertension- controlled 401.9 I10   4. FTT (failure to thrive) in adult 783.7 R62.7   5. Blindness with deafness/mute 369.00 H54.0    V49.85 Z73.82    389.9 H91.90    --continue wound care as directed  --f/u with specialists as scheduled  --encourage po intake  --continue  PT/OT as indicated  --pain control  --will follow   Graham. Perlie Gold  [facility] [address] [place], [place] [address] [phone] Office (Wednesdays and Fridays 8 AM - 5 PM) [phone] Cell (Monday-Friday 8 AM - 5 PM)

## 2015-02-22 DIAGNOSIS — Z992 Dependence on renal dialysis: Secondary | ICD-10-CM | POA: Diagnosis not present

## 2015-02-22 DIAGNOSIS — N186 End stage renal disease: Secondary | ICD-10-CM | POA: Diagnosis not present

## 2015-02-23 DIAGNOSIS — I5043 Acute on chronic combined systolic (congestive) and diastolic (congestive) heart failure: Secondary | ICD-10-CM | POA: Diagnosis present

## 2015-02-23 DIAGNOSIS — I5021 Acute systolic (congestive) heart failure: Secondary | ICD-10-CM | POA: Diagnosis not present

## 2015-02-23 DIAGNOSIS — R079 Chest pain, unspecified: Secondary | ICD-10-CM | POA: Diagnosis not present

## 2015-02-23 DIAGNOSIS — E119 Type 2 diabetes mellitus without complications: Secondary | ICD-10-CM | POA: Diagnosis not present

## 2015-02-23 DIAGNOSIS — I509 Heart failure, unspecified: Secondary | ICD-10-CM | POA: Diagnosis not present

## 2015-02-23 DIAGNOSIS — R0600 Dyspnea, unspecified: Secondary | ICD-10-CM | POA: Diagnosis present

## 2015-02-23 DIAGNOSIS — R0602 Shortness of breath: Secondary | ICD-10-CM | POA: Diagnosis not present

## 2015-02-23 DIAGNOSIS — Z992 Dependence on renal dialysis: Secondary | ICD-10-CM | POA: Diagnosis not present

## 2015-02-23 DIAGNOSIS — L03031 Cellulitis of right toe: Secondary | ICD-10-CM | POA: Diagnosis not present

## 2015-02-23 DIAGNOSIS — H913 Deaf nonspeaking, not elsewhere classified: Secondary | ICD-10-CM | POA: Diagnosis not present

## 2015-02-23 DIAGNOSIS — I5023 Acute on chronic systolic (congestive) heart failure: Secondary | ICD-10-CM | POA: Diagnosis not present

## 2015-02-23 DIAGNOSIS — I2510 Atherosclerotic heart disease of native coronary artery without angina pectoris: Secondary | ICD-10-CM | POA: Diagnosis not present

## 2015-02-23 DIAGNOSIS — Z794 Long term (current) use of insulin: Secondary | ICD-10-CM | POA: Diagnosis not present

## 2015-02-23 DIAGNOSIS — F419 Anxiety disorder, unspecified: Secondary | ICD-10-CM | POA: Diagnosis not present

## 2015-02-23 DIAGNOSIS — J810 Acute pulmonary edema: Secondary | ICD-10-CM | POA: Diagnosis not present

## 2015-02-23 DIAGNOSIS — L03032 Cellulitis of left toe: Secondary | ICD-10-CM | POA: Diagnosis not present

## 2015-02-23 DIAGNOSIS — E785 Hyperlipidemia, unspecified: Secondary | ICD-10-CM | POA: Diagnosis not present

## 2015-02-23 DIAGNOSIS — I5041 Acute combined systolic (congestive) and diastolic (congestive) heart failure: Secondary | ICD-10-CM | POA: Diagnosis not present

## 2015-02-23 DIAGNOSIS — E875 Hyperkalemia: Secondary | ICD-10-CM | POA: Diagnosis not present

## 2015-02-23 DIAGNOSIS — M869 Osteomyelitis, unspecified: Secondary | ICD-10-CM | POA: Diagnosis not present

## 2015-02-23 DIAGNOSIS — Z7382 Dual sensory impairment: Secondary | ICD-10-CM | POA: Diagnosis not present

## 2015-02-23 DIAGNOSIS — J9601 Acute respiratory failure with hypoxia: Secondary | ICD-10-CM | POA: Diagnosis present

## 2015-02-23 DIAGNOSIS — I472 Ventricular tachycardia: Secondary | ICD-10-CM | POA: Diagnosis present

## 2015-02-23 DIAGNOSIS — M86271 Subacute osteomyelitis, right ankle and foot: Secondary | ICD-10-CM | POA: Diagnosis not present

## 2015-02-23 DIAGNOSIS — D649 Anemia, unspecified: Secondary | ICD-10-CM | POA: Diagnosis not present

## 2015-02-23 DIAGNOSIS — E8770 Fluid overload, unspecified: Secondary | ICD-10-CM | POA: Diagnosis not present

## 2015-02-23 DIAGNOSIS — J811 Chronic pulmonary edema: Secondary | ICD-10-CM | POA: Diagnosis not present

## 2015-02-23 DIAGNOSIS — I10 Essential (primary) hypertension: Secondary | ICD-10-CM | POA: Diagnosis not present

## 2015-02-23 DIAGNOSIS — L97529 Non-pressure chronic ulcer of other part of left foot with unspecified severity: Secondary | ICD-10-CM | POA: Diagnosis present

## 2015-02-23 DIAGNOSIS — F4320 Adjustment disorder, unspecified: Secondary | ICD-10-CM | POA: Diagnosis not present

## 2015-02-23 DIAGNOSIS — M868X7 Other osteomyelitis, ankle and foot: Secondary | ICD-10-CM | POA: Diagnosis not present

## 2015-02-23 DIAGNOSIS — Z7901 Long term (current) use of anticoagulants: Secondary | ICD-10-CM | POA: Diagnosis not present

## 2015-02-23 DIAGNOSIS — N189 Chronic kidney disease, unspecified: Secondary | ICD-10-CM | POA: Diagnosis not present

## 2015-02-23 DIAGNOSIS — D631 Anemia in chronic kidney disease: Secondary | ICD-10-CM | POA: Diagnosis not present

## 2015-02-23 DIAGNOSIS — I739 Peripheral vascular disease, unspecified: Secondary | ICD-10-CM | POA: Diagnosis not present

## 2015-02-23 DIAGNOSIS — Z7982 Long term (current) use of aspirin: Secondary | ICD-10-CM | POA: Diagnosis not present

## 2015-02-23 DIAGNOSIS — N186 End stage renal disease: Secondary | ICD-10-CM | POA: Diagnosis not present

## 2015-02-23 DIAGNOSIS — N2581 Secondary hyperparathyroidism of renal origin: Secondary | ICD-10-CM | POA: Diagnosis not present

## 2015-02-23 DIAGNOSIS — I120 Hypertensive chronic kidney disease with stage 5 chronic kidney disease or end stage renal disease: Secondary | ICD-10-CM | POA: Diagnosis not present

## 2015-02-23 DIAGNOSIS — D509 Iron deficiency anemia, unspecified: Secondary | ICD-10-CM | POA: Diagnosis not present

## 2015-02-23 DIAGNOSIS — R011 Cardiac murmur, unspecified: Secondary | ICD-10-CM | POA: Diagnosis not present

## 2015-02-23 DIAGNOSIS — M86272 Subacute osteomyelitis, left ankle and foot: Secondary | ICD-10-CM | POA: Diagnosis not present

## 2015-02-23 DIAGNOSIS — H540 Blindness, both eyes: Secondary | ICD-10-CM | POA: Diagnosis not present

## 2015-02-23 DIAGNOSIS — F28 Other psychotic disorder not due to a substance or known physiological condition: Secondary | ICD-10-CM | POA: Diagnosis not present

## 2015-02-23 DIAGNOSIS — F29 Unspecified psychosis not due to a substance or known physiological condition: Secondary | ICD-10-CM | POA: Diagnosis not present

## 2015-02-23 DIAGNOSIS — I252 Old myocardial infarction: Secondary | ICD-10-CM | POA: Diagnosis not present

## 2015-02-23 DIAGNOSIS — Z66 Do not resuscitate: Secondary | ICD-10-CM | POA: Diagnosis present

## 2015-02-23 DIAGNOSIS — D259 Leiomyoma of uterus, unspecified: Secondary | ICD-10-CM | POA: Diagnosis present

## 2015-02-23 DIAGNOSIS — E1129 Type 2 diabetes mellitus with other diabetic kidney complication: Secondary | ICD-10-CM | POA: Diagnosis not present

## 2015-02-23 DIAGNOSIS — L97519 Non-pressure chronic ulcer of other part of right foot with unspecified severity: Secondary | ICD-10-CM | POA: Diagnosis not present

## 2015-02-23 DIAGNOSIS — M19042 Primary osteoarthritis, left hand: Secondary | ICD-10-CM | POA: Diagnosis not present

## 2015-02-23 DIAGNOSIS — I255 Ischemic cardiomyopathy: Secondary | ICD-10-CM | POA: Diagnosis present

## 2015-03-04 ENCOUNTER — Other Ambulatory Visit: Payer: Self-pay | Admitting: Internal Medicine

## 2015-03-04 ENCOUNTER — Ambulatory Visit (HOSPITAL_COMMUNITY)
Admission: RE | Admit: 2015-03-04 | Discharge: 2015-03-04 | Disposition: A | Discharge status: Home / Self Care | Payer: Medicare Other | Source: Ambulatory Visit | Attending: Vascular Surgery | Admitting: Vascular Surgery

## 2015-03-04 DIAGNOSIS — L97519 Non-pressure chronic ulcer of other part of right foot with unspecified severity: Secondary | ICD-10-CM | POA: Diagnosis not present

## 2015-03-04 DIAGNOSIS — L97529 Non-pressure chronic ulcer of other part of left foot with unspecified severity: Secondary | ICD-10-CM | POA: Diagnosis not present

## 2015-03-12 ENCOUNTER — Telehealth: Payer: Self-pay | Admitting: Vascular Surgery

## 2015-03-12 NOTE — Telephone Encounter (Signed)
Left a message for [name] regarding move of appt to 03/29- Teika had already left for the day and I was transferred to the RN desk, dpm

## 2015-03-12 NOTE — Telephone Encounter (Signed)
-----   Message from Denman George, RN sent at 03/12/2015  3:08 PM EDT ----- Regarding: request for earlier appt. Rec'd phone call from Watkins, @ [employer] [employer].  Dr. Nyoka Cowden is requesting pt. be seen sooner than 4/12, due to open wounds bilateral great toes, and abnormal ABI's.  (ABI's done at [facility] on 3/10)  You can reach [facility] @ [phone].

## 2015-03-22 ENCOUNTER — Encounter: Payer: Self-pay | Admitting: Vascular Surgery

## 2015-03-23 ENCOUNTER — Encounter: Payer: Self-pay | Admitting: Vascular Surgery

## 2015-03-23 ENCOUNTER — Ambulatory Visit (INDEPENDENT_AMBULATORY_CARE_PROVIDER_SITE_OTHER): Payer: Medicare Other | Admitting: Vascular Surgery

## 2015-03-23 VITALS — BP 148/65 | HR 76 | Ht 68.0 in | Wt 181.0 lb

## 2015-03-23 DIAGNOSIS — I739 Peripheral vascular disease, unspecified: Secondary | ICD-10-CM | POA: Diagnosis not present

## 2015-03-23 DIAGNOSIS — I255 Ischemic cardiomyopathy: Secondary | ICD-10-CM | POA: Diagnosis not present

## 2015-03-23 NOTE — Progress Notes (Signed)
Subjective:     Patient ID: [name], female   DOB: [birth year], 59 y.o.   MRN: [id number]  HPI this 59 year old female who is a resident of [facility] was referred by Dr. Bard Herbert for evaluation of nonhealing ulcers bilateral first toes. The patient is deaf and also visually impaired and is accompanied by an interpreter. She apparently does not ambulate because of these issues and is in a wheelchair. It is unclear how Ducey the ulcerations of the toes have been present but at least for a few months. She complains of pain and swelling in the toes and foot. He is on hemodialysis Monday Wednesday Friday with a left upper arm AV graft being utilized. She also has significant history of coronary artery disease with ischemic cardiomyopathy and a non-STEMI late 2015 .  Past Medical History  Diagnosis Date  . Hyperlipidemia   . Hypertension     a. Has previously refused blood pressure meds.   . Deaf     Divorced from husband but lives with him. Has daughter but she does not care for her.  . Bilateral leg edema     a. Chronic.  Marland Kitchen Psychiatric disorder     She frequently exhibits paranoia and has been diagnosed with psychotic d/o NOS during hospital stay in the past. She is tangetial and perseverative during her visits. She apparently has had a bad experience with mental health in [place] in the past and refuses to discuss mental issues for fear that she will be sent back there. Her paranoia, communication issues, financial woes and lack of fam  . Fibroid uterus   . Anemia     Due to fibroids.  . Heart murmur   . Diabetes mellitus     a. Per PCP note 2012 (A1C 9.2) - pt unwilling to take meds and was educated on risk of uncontrolled DM.  b. A1C 5.9 in 11/2013.  Marland Kitchen Coronary artery disease   . MI (myocardial infarction)   . Blind   . ESRD on hemodialysis     Mon, Wed, Fri    History  Substance Use Topics  . Smoking status: Never Smoker   . Smokeless tobacco: Not on file   Comment: Prior dip  . Alcohol Use: No    Family History  Problem Relation Age of Onset  . Other Father     Drowned from fishing?  . Heart disease      No Known Allergies   Current outpatient prescriptions:  .  acetaminophen (TYLENOL) 325 MG tablet, Take 650 mg by mouth 3 (three) times daily as needed., Disp: , Rfl:  .  Amino Acids-Protein Hydrolys (FEEDING SUPPLEMENT, PRO-STAT SUGAR FREE 64,) LIQD, Take 30 mLs by mouth 3 (three) times daily with meals., Disp: 900 mL, Rfl: 0 .  amiodarone (PACERONE) 200 MG tablet, Take 200 mg by mouth daily., Disp: , Rfl:  .  aspirin 81 MG tablet, Take 81 mg by mouth daily. For hypertension, Disp: , Rfl:  .  atorvastatin (LIPITOR) 10 MG tablet, Take 10 mg by mouth daily., Disp: , Rfl:  .  calcium acetate (PHOSLO) 667 MG capsule, Take 1 capsule (667 mg total) by mouth 3 (three) times daily with meals., Disp: 30 capsule, Rfl: 1 .  clopidogrel (PLAVIX) 75 MG tablet, Take 1 tablet (75 mg total) by mouth daily., Disp: , Rfl:  .  Docusate Sodium (DSS) 100 MG CAPS, Take 100 mg by mouth 2 (two) times daily. For constipation, Disp: ,  Rfl:  .  guaiFENesin (MUCINEX) 600 MG 12 hr tablet, Take 1 tablet (600 mg total) by mouth 2 (two) times daily., Disp: , Rfl:  .  lactulose (CHRONULAC) 10 GM/15ML solution, Take 30 mLs (20 g total) by mouth 2 (two) times daily as needed for moderate constipation., Disp: 240 mL, Rfl: 0 .  levalbuterol (XOPENEX) 0.63 MG/3ML nebulizer solution, Take 3 mLs (0.63 mg total) by nebulization every 2 (two) hours as needed for wheezing or shortness of breath., Disp: 3 mL, Rfl: 12 .  lisinopril (PRINIVIL,ZESTRIL) 2.5 MG tablet, Take 1 tablet (2.5 mg total) by mouth daily., Disp: , Rfl:  .  metoprolol succinate (TOPROL-XL) 50 MG 24 hr tablet, Take 1 tablet (50 mg total) by mouth daily. Take with or immediately following a meal., Disp: , Rfl:  .  multivitamin (RENA-VIT) TABS tablet, Take 1 tablet by mouth at bedtime., Disp: 30 tablet, Rfl: 0 .   nitroGLYCERIN (NITROSTAT) 0.4 MG SL tablet, Place 1 tablet (0.4 mg total) under the tongue every 5 (five) minutes as needed for chest pain., Disp: , Rfl: 12  Filed Vitals:   03/23/15 0913 03/23/15 0921  BP: 160/67 148/65  Pulse: 76   Height: 5\' 8"  (1.727 m)   Weight: 181 lb (82.101 kg)   SpO2: 99%     Body mass index is 27.53 kg/(m^2).           Review of Systems unable to obtain significant review of systems due to communication problems with patient's deafness and visual impairment     Objective:   Physical Exam BP 148/65 mmHg  Pulse 76  Ht 5\' 8"  (1.727 m)  Wt 181 lb (82.101 kg)  BMI 27.53 kg/m2  SpO2 99%  General patient alert and oriented. She is deaf. Visually impaired. Accompanied by interpreter. Lungs no rhonchi or wheezing Cardiovascular regular rhythm no murmurs Lower extremity exam reveals ulceration of anterior aspect right first toe and dry chronic necrotic area beneath nail of left first toe. No proximal infection noted. 3+ femoral 2+ popliteal and 2+ posterior tibial pulse on the left 3+ femoral 2+ popliteal and 2+ dorsalis pedis pulse on the right Triphasic flow audible in right DP and left PT by hand-held Doppler I reviewed the lower extremity arterial study performed in our vascular lab on 03/04/2015. ABI on the right is 0.77 in the DP and on the left is 0.78 in the PT both with triphasic flow     Assessment:     Bilateral nonhealing first toe ulcers in patient with diabetes mellitus, end-stage renal disease on hemodialysis, history of coronary artery disease with previous non-STEMI. Patient is deaf and severely visually impaired Patient has patent superficial femoral popliteal and at least some tibial vessels with good flow in right DP and left PT which is triphasic with ABI of 0.77    Patient has suspected small vessel disease in foot bilaterally  Plan:     No need for further vascular evaluation. Patient has probable in-line flow to foot 3 right  DP and left PT. She may need bilateral first toe amputations. Will refer her to Dr. Meridee Score for further evaluation of possible bilateral toe amputations versus further orthopedic and wound care. Discussed this with patient through her interpreter and she seems to understand

## 2015-03-23 NOTE — Progress Notes (Signed)
Patient ID: [name], female   DOB: 03-11-1956, 59 y.o.   MRN: [id number]  [facility]     No Known Allergies     Chief Complaint  Patient presents with  . Medical Management of Chronic Issues    HPI:  She is a [place] term resident of this facility being seen for the management of her chronic illnesses. She is unable to fully participate in the hpi or ros. There is no significant change in her status. There are no nursing concerns at this time.    Past Medical History  Diagnosis Date  . Hyperlipidemia   . Hypertension     a. Has previously refused blood pressure meds.   . Deaf     Divorced from husband but lives with him. Has daughter but she does not care for her.  . Bilateral leg edema     a. Chronic.  Marland Kitchen Psychiatric disorder     She frequently exhibits paranoia and has been diagnosed with psychotic d/o NOS during hospital stay in the past. She is tangetial and perseverative during her visits. She apparently has had a bad experience with mental health in [place] in the past and refuses to discuss mental issues for fear that she will be sent back there. Her paranoia, communication issues, financial woes and lack of fam  . Fibroid uterus   . Anemia     Due to fibroids.  . Heart murmur   . Diabetes mellitus     a. Per PCP note 2012 (A1C 9.2) - pt unwilling to take meds and was educated on risk of uncontrolled DM.  b. A1C 5.9 in 11/2013.  Marland Kitchen Coronary artery disease   . MI (myocardial infarction)   . Blind   . ESRD on hemodialysis     Mon, Wed, Fri    Past Surgical History  Procedure Laterality Date  . No past surgeries    . Insertion of dialysis catheter Right 01/02/2014    Procedure: INSERTION OF DIALYSIS CATHETER;  Surgeon: Rosetta Posner, MD;  Location: [facility];  Service: Vascular;  Laterality: Right;  . Av fistula placement Left 01/02/2014    Procedure: ARTERIOVENOUS (AV) FISTULA CREATION;  Surgeon: Rosetta Posner, MD;  Location: [facility] OR;  Service: Vascular;  Laterality: Left;     VITAL SIGNS BP 126/61 mmHg  Pulse 74  Ht 5' 8"  (1.727 m)  Wt 179 lb (81.194 kg)  BMI 27.22 kg/m2  SpO2 98%   Outpatient Encounter Prescriptions as of 02/11/2015  Medication Sig  . acetaminophen (TYLENOL) 325 MG tablet Take 650 mg by mouth 3 (three) times daily as needed.  . Amino Acids-Protein Hydrolys (FEEDING SUPPLEMENT, PRO-STAT SUGAR FREE 64,) LIQD Take 30 mLs by mouth 3 (three) times daily with meals.  Marland Kitchen amiodarone (PACERONE) 200 MG tablet Take 200 mg by mouth daily.  Marland Kitchen aspirin 81 MG tablet Take 81 mg by mouth daily. For hypertension  . atorvastatin (LIPITOR) 10 MG tablet Take 10 mg by mouth daily.  . calcium acetate (PHOSLO) 667 MG capsule Take 1 capsule (667 mg total) by mouth 3 (three) times daily with meals.  . clopidogrel (PLAVIX) 75 MG tablet Take 1 tablet (75 mg total) by mouth daily.  Mariane Baumgarten Sodium (DSS) 100 MG CAPS Take 100 mg by mouth 2 (two) times daily. For constipation  . guaiFENesin (MUCINEX) 600 MG 12 hr tablet Take 1 tablet (600 mg total) by mouth 2 (two) times daily.  Marland Kitchen lactulose (CHRONULAC) 10 GM/15ML solution Take  30 mLs (20 g total) by mouth 2 (two) times daily as needed for moderate constipation.  Marland Kitchen levalbuterol (XOPENEX) 0.63 MG/3ML nebulizer solution Take 3 mLs (0.63 mg total) by nebulization every 2 (two) hours as needed for wheezing or shortness of breath.  . lisinopril (PRINIVIL,ZESTRIL) 2.5 MG tablet Take 1 tablet (2.5 mg total) by mouth daily.  . metoprolol succinate (TOPROL-XL) 50 MG 24 hr tablet Take 1 tablet (50 mg total) by mouth daily. Take with or immediately following a meal.  . multivitamin (RENA-VIT) TABS tablet Take 1 tablet by mouth at bedtime.  . nitroGLYCERIN (NITROSTAT) 0.4 MG SL tablet Place 1 tablet (0.4 mg total) under the tongue every 5 (five) minutes as needed for chest pain.     SIGNIFICANT DIAGNOSTIC EXAMS   11-20-14: chest x-ray: 1. Mild to moderate CHF.  11-20-14: 2-d echo: Left ventricle: The cavity size was normal.  There was moderate concentric hypertrophy. Systolic function was moderately to severely reduced. The estimated ejection fraction was in the range of 30% to 35%. Dyskinesis and aneurysmal deformity of the mid-apicalanteroseptal, anterior, inferior, inferoseptal, and apical myocardium; consistent with infarction in the distribution of the left anterior descending coronary artery. Features are consistent with a pseudonormal left ventricular filling pattern, with concomitant abnormal relaxation and increased filling pressure - Mitral valve: Severely calcified annulus. - Left atrium: The atrium was moderately dilated. - Pulmonary arteries: Systolic pressure was mildly increased.  11-24-14: Myoview: 1. Fixed defects involving the apex, anterior lateral wall and the inferior wall. The apex and anterior lateral wall fixed defects aresuggestive for an infarct. No evidence for reversibility or ischemia. 2. Diffuse hypokinesia and minimal wall motion along the lateral wall. 3. Left ventricular ejection fraction is 35%. 4. High-risk stress test findings*.   LABS REVIEWED:   11-19-14: wbc 10.1; hgb .6; hct 29.1; mcv 93.6; plt 272; glucose 260; bun 83; creat 6.65; k+6.7; na++128; alk phos 142; ast 60; lat 68; t pro 8.6; albumin 3.5 11-21-14: wbc 8.2; hgb 9.0; hct 27.7; mcv 97.2; plt 251; glucose 166; bun 45; creat 4.77; k+5.0; na++134; phos 5.2; albumin 3.2; hgb a1c 6.0 11-24-14: wbc 7.2; hgb 8.6; hct 27.2; mcv 96.5; plt 233; glucose 99; bun 40; creat 4.51; k+4.4; na++132; phos 6.0; albumin 3.0 01-08-15: hgb a1c 5.2    ROS Unable to perform ROS   Physical Exam Constitutional: She appears well-developed and well-nourished. No distress.  Over weight   Neck: Neck supple. No JVD present. No thyromegaly present.  Cardiovascular: Normal rate, regular rhythm and intact distal pulses.   Respiratory: Effort normal and breath sounds normal. No respiratory distress. She has no wheezes.  GI: Soft. Bowel sounds are  normal. She exhibits no distension. There is no tenderness.  Musculoskeletal: She exhibits no edema.  Is able to move all extremities   Neurological: She is alert.  Skin: Skin is warm and dry. She is not diaphoretic.  A/v fistula left upper arm +thril +bruit  Left foot ulcer; dressing intact        ASSESSMENT/ PLAN:  . ESRD: is on hemodialysis three days per week. Will continue phoslo 667 mg three times daily will monitor her status.   2. Dyslipidemia: will continue lipitor 80 mg daily   3. Hypertension: will continue clonidine 0.1 mg patch weekly; hydralazine 100 mg every 8 hours; lisinopril 2.5 mg daily and toprol xl 50 mg daily   4. Anterior wall MI: no chest pain present; will continue imdur 60 mg daily; asa 81 mg daily plavix  75 mg daily and ntg prn will monitor   5. Ischemic cardiomyopathy: ef is 30-35%; she is presently stable will continue hydralazine 100 mg three times daily; will continue to monitor weight at dialysis will monitor her status   6. Anemia due to ckd: will continue aranesp at dialysis  7. Psychosis: she is emotionally stable will continue risperdal consta 25 mg every 2 weeks.   8. Constipation: will continue colace twice daily and chronulac 30 cc twice daily as needed   9. Acute respiratory failure: she is presently stable is on room air; will continue xopenex every 2 hours as needed   Will check lipids.    Ok Edwards NP [facility]  Contact [phone] Monday through Friday 8am- 5pm  After hours call [phone]

## 2015-03-23 NOTE — Addendum Note (Signed)
Addended by: Mena Goes on: 03/23/2015 02:08 PM   Modules accepted: Orders

## 2015-03-24 DIAGNOSIS — L03031 Cellulitis of right toe: Secondary | ICD-10-CM | POA: Diagnosis not present

## 2015-03-24 DIAGNOSIS — M86272 Subacute osteomyelitis, left ankle and foot: Secondary | ICD-10-CM | POA: Diagnosis not present

## 2015-03-24 DIAGNOSIS — L03032 Cellulitis of left toe: Secondary | ICD-10-CM | POA: Diagnosis not present

## 2015-03-24 DIAGNOSIS — M86271 Subacute osteomyelitis, right ankle and foot: Secondary | ICD-10-CM | POA: Diagnosis not present

## 2015-03-25 ENCOUNTER — Non-Acute Institutional Stay (SKILLED_NURSING_FACILITY): Payer: Medicare Other | Admitting: Adult Health

## 2015-03-25 DIAGNOSIS — E785 Hyperlipidemia, unspecified: Secondary | ICD-10-CM | POA: Diagnosis not present

## 2015-03-25 DIAGNOSIS — I739 Peripheral vascular disease, unspecified: Secondary | ICD-10-CM

## 2015-03-25 DIAGNOSIS — I10 Essential (primary) hypertension: Secondary | ICD-10-CM | POA: Diagnosis not present

## 2015-03-25 DIAGNOSIS — N186 End stage renal disease: Secondary | ICD-10-CM

## 2015-03-25 DIAGNOSIS — Z992 Dependence on renal dialysis: Secondary | ICD-10-CM | POA: Diagnosis not present

## 2015-03-25 DIAGNOSIS — I255 Ischemic cardiomyopathy: Secondary | ICD-10-CM | POA: Diagnosis not present

## 2015-03-26 DIAGNOSIS — F29 Unspecified psychosis not due to a substance or known physiological condition: Secondary | ICD-10-CM | POA: Diagnosis not present

## 2015-03-26 DIAGNOSIS — M86271 Subacute osteomyelitis, right ankle and foot: Secondary | ICD-10-CM | POA: Diagnosis not present

## 2015-03-26 DIAGNOSIS — I252 Old myocardial infarction: Secondary | ICD-10-CM | POA: Diagnosis not present

## 2015-03-26 DIAGNOSIS — I120 Hypertensive chronic kidney disease with stage 5 chronic kidney disease or end stage renal disease: Secondary | ICD-10-CM | POA: Diagnosis not present

## 2015-03-26 DIAGNOSIS — N186 End stage renal disease: Secondary | ICD-10-CM | POA: Diagnosis not present

## 2015-03-26 DIAGNOSIS — Z992 Dependence on renal dialysis: Secondary | ICD-10-CM | POA: Diagnosis not present

## 2015-03-26 DIAGNOSIS — N189 Chronic kidney disease, unspecified: Secondary | ICD-10-CM | POA: Diagnosis not present

## 2015-03-26 DIAGNOSIS — M86272 Subacute osteomyelitis, left ankle and foot: Secondary | ICD-10-CM | POA: Diagnosis not present

## 2015-03-26 DIAGNOSIS — Z7982 Long term (current) use of aspirin: Secondary | ICD-10-CM | POA: Diagnosis not present

## 2015-03-26 DIAGNOSIS — M869 Osteomyelitis, unspecified: Secondary | ICD-10-CM | POA: Diagnosis not present

## 2015-03-26 DIAGNOSIS — I10 Essential (primary) hypertension: Secondary | ICD-10-CM | POA: Diagnosis not present

## 2015-03-26 DIAGNOSIS — E875 Hyperkalemia: Secondary | ICD-10-CM | POA: Diagnosis present

## 2015-03-26 DIAGNOSIS — M19042 Primary osteoarthritis, left hand: Secondary | ICD-10-CM | POA: Diagnosis not present

## 2015-03-26 DIAGNOSIS — I472 Ventricular tachycardia: Secondary | ICD-10-CM | POA: Diagnosis present

## 2015-03-26 DIAGNOSIS — F28 Other psychotic disorder not due to a substance or known physiological condition: Secondary | ICD-10-CM | POA: Diagnosis not present

## 2015-03-26 DIAGNOSIS — I2510 Atherosclerotic heart disease of native coronary artery without angina pectoris: Secondary | ICD-10-CM | POA: Diagnosis not present

## 2015-03-26 DIAGNOSIS — I5041 Acute combined systolic (congestive) and diastolic (congestive) heart failure: Secondary | ICD-10-CM | POA: Diagnosis not present

## 2015-03-26 DIAGNOSIS — R0602 Shortness of breath: Secondary | ICD-10-CM | POA: Diagnosis not present

## 2015-03-26 DIAGNOSIS — E119 Type 2 diabetes mellitus without complications: Secondary | ICD-10-CM | POA: Diagnosis not present

## 2015-03-26 DIAGNOSIS — J810 Acute pulmonary edema: Secondary | ICD-10-CM | POA: Diagnosis not present

## 2015-03-26 DIAGNOSIS — I255 Ischemic cardiomyopathy: Secondary | ICD-10-CM | POA: Diagnosis present

## 2015-03-26 DIAGNOSIS — M868X7 Other osteomyelitis, ankle and foot: Secondary | ICD-10-CM | POA: Diagnosis present

## 2015-03-26 DIAGNOSIS — R079 Chest pain, unspecified: Secondary | ICD-10-CM | POA: Diagnosis not present

## 2015-03-26 DIAGNOSIS — J9601 Acute respiratory failure with hypoxia: Secondary | ICD-10-CM | POA: Diagnosis present

## 2015-03-26 DIAGNOSIS — Z66 Do not resuscitate: Secondary | ICD-10-CM | POA: Diagnosis present

## 2015-03-26 DIAGNOSIS — I509 Heart failure, unspecified: Secondary | ICD-10-CM | POA: Diagnosis not present

## 2015-03-26 DIAGNOSIS — Z7901 Long term (current) use of anticoagulants: Secondary | ICD-10-CM | POA: Diagnosis not present

## 2015-03-26 DIAGNOSIS — Z7382 Dual sensory impairment: Secondary | ICD-10-CM | POA: Diagnosis not present

## 2015-03-26 DIAGNOSIS — R0600 Dyspnea, unspecified: Secondary | ICD-10-CM | POA: Diagnosis not present

## 2015-03-26 DIAGNOSIS — L03031 Cellulitis of right toe: Secondary | ICD-10-CM | POA: Diagnosis not present

## 2015-03-26 DIAGNOSIS — E785 Hyperlipidemia, unspecified: Secondary | ICD-10-CM | POA: Diagnosis not present

## 2015-03-26 DIAGNOSIS — F4320 Adjustment disorder, unspecified: Secondary | ICD-10-CM | POA: Diagnosis not present

## 2015-03-26 DIAGNOSIS — D649 Anemia, unspecified: Secondary | ICD-10-CM | POA: Diagnosis not present

## 2015-03-26 DIAGNOSIS — I5023 Acute on chronic systolic (congestive) heart failure: Secondary | ICD-10-CM | POA: Diagnosis not present

## 2015-03-26 DIAGNOSIS — H540 Blindness, both eyes: Secondary | ICD-10-CM | POA: Diagnosis not present

## 2015-03-26 DIAGNOSIS — R011 Cardiac murmur, unspecified: Secondary | ICD-10-CM | POA: Diagnosis not present

## 2015-03-26 DIAGNOSIS — H913 Deaf nonspeaking, not elsewhere classified: Secondary | ICD-10-CM | POA: Diagnosis present

## 2015-03-26 DIAGNOSIS — D259 Leiomyoma of uterus, unspecified: Secondary | ICD-10-CM | POA: Diagnosis present

## 2015-03-26 DIAGNOSIS — J811 Chronic pulmonary edema: Secondary | ICD-10-CM | POA: Diagnosis not present

## 2015-03-26 DIAGNOSIS — Z794 Long term (current) use of insulin: Secondary | ICD-10-CM | POA: Diagnosis not present

## 2015-03-26 DIAGNOSIS — I5021 Acute systolic (congestive) heart failure: Secondary | ICD-10-CM | POA: Diagnosis not present

## 2015-03-26 DIAGNOSIS — E8770 Fluid overload, unspecified: Secondary | ICD-10-CM | POA: Diagnosis not present

## 2015-03-26 DIAGNOSIS — F419 Anxiety disorder, unspecified: Secondary | ICD-10-CM | POA: Diagnosis not present

## 2015-03-26 DIAGNOSIS — I5043 Acute on chronic combined systolic (congestive) and diastolic (congestive) heart failure: Secondary | ICD-10-CM | POA: Diagnosis present

## 2015-03-26 DIAGNOSIS — L03032 Cellulitis of left toe: Secondary | ICD-10-CM | POA: Diagnosis not present

## 2015-03-26 DIAGNOSIS — I739 Peripheral vascular disease, unspecified: Secondary | ICD-10-CM | POA: Diagnosis not present

## 2015-03-29 ENCOUNTER — Other Ambulatory Visit (HOSPITAL_COMMUNITY): Payer: Self-pay | Admitting: Orthopedic Surgery

## 2015-04-01 MED ORDER — CEFAZOLIN SODIUM-DEXTROSE 2-3 GM-% IV SOLR
2.0000 g | INTRAVENOUS | Status: DC
  Filled 2015-04-01: qty 50

## 2015-04-01 NOTE — Progress Notes (Signed)
Pt pre-op instructions given to pt nurse [place].

## 2015-04-02 ENCOUNTER — Ambulatory Visit (HOSPITAL_COMMUNITY): Payer: Medicare Other | Admitting: Anesthesiology

## 2015-04-02 ENCOUNTER — Encounter (HOSPITAL_COMMUNITY): Payer: Self-pay

## 2015-04-02 ENCOUNTER — Encounter (HOSPITAL_COMMUNITY)
Admission: RE | Disposition: A | Discharge status: Skilled Nursing Facility | Payer: Self-pay | Source: Ambulatory Visit | Attending: Orthopedic Surgery

## 2015-04-02 ENCOUNTER — Ambulatory Visit (HOSPITAL_COMMUNITY)
Admission: RE | Admit: 2015-04-02 | Discharge: 2015-04-02 | Disposition: A | Discharge status: Skilled Nursing Facility | Payer: Medicare Other | Source: Ambulatory Visit | Attending: Orthopedic Surgery | Admitting: Orthopedic Surgery

## 2015-04-02 DIAGNOSIS — F28 Other psychotic disorder not due to a substance or known physiological condition: Secondary | ICD-10-CM | POA: Insufficient documentation

## 2015-04-02 DIAGNOSIS — I739 Peripheral vascular disease, unspecified: Secondary | ICD-10-CM | POA: Insufficient documentation

## 2015-04-02 DIAGNOSIS — L03031 Cellulitis of right toe: Secondary | ICD-10-CM | POA: Diagnosis not present

## 2015-04-02 DIAGNOSIS — D649 Anemia, unspecified: Secondary | ICD-10-CM | POA: Insufficient documentation

## 2015-04-02 DIAGNOSIS — I252 Old myocardial infarction: Secondary | ICD-10-CM | POA: Insufficient documentation

## 2015-04-02 DIAGNOSIS — I2510 Atherosclerotic heart disease of native coronary artery without angina pectoris: Secondary | ICD-10-CM | POA: Insufficient documentation

## 2015-04-02 DIAGNOSIS — H540 Blindness, both eyes: Secondary | ICD-10-CM | POA: Insufficient documentation

## 2015-04-02 DIAGNOSIS — N186 End stage renal disease: Secondary | ICD-10-CM | POA: Diagnosis not present

## 2015-04-02 DIAGNOSIS — I120 Hypertensive chronic kidney disease with stage 5 chronic kidney disease or end stage renal disease: Secondary | ICD-10-CM | POA: Diagnosis not present

## 2015-04-02 DIAGNOSIS — L03032 Cellulitis of left toe: Secondary | ICD-10-CM | POA: Diagnosis not present

## 2015-04-02 DIAGNOSIS — M869 Osteomyelitis, unspecified: Secondary | ICD-10-CM | POA: Diagnosis not present

## 2015-04-02 DIAGNOSIS — M868X7 Other osteomyelitis, ankle and foot: Secondary | ICD-10-CM | POA: Diagnosis not present

## 2015-04-02 DIAGNOSIS — M86271 Subacute osteomyelitis, right ankle and foot: Secondary | ICD-10-CM | POA: Diagnosis not present

## 2015-04-02 DIAGNOSIS — E785 Hyperlipidemia, unspecified: Secondary | ICD-10-CM | POA: Insufficient documentation

## 2015-04-02 DIAGNOSIS — E119 Type 2 diabetes mellitus without complications: Secondary | ICD-10-CM | POA: Diagnosis not present

## 2015-04-02 DIAGNOSIS — I509 Heart failure, unspecified: Secondary | ICD-10-CM | POA: Insufficient documentation

## 2015-04-02 DIAGNOSIS — M86272 Subacute osteomyelitis, left ankle and foot: Secondary | ICD-10-CM | POA: Diagnosis not present

## 2015-04-02 DIAGNOSIS — R011 Cardiac murmur, unspecified: Secondary | ICD-10-CM | POA: Insufficient documentation

## 2015-04-02 DIAGNOSIS — Z992 Dependence on renal dialysis: Secondary | ICD-10-CM | POA: Insufficient documentation

## 2015-04-02 HISTORY — PX: AMPUTATION: SHX166

## 2015-04-02 HISTORY — DX: Heart failure, unspecified: I50.9

## 2015-04-02 LAB — POCT I-STAT 4, (NA,K, GLUC, HGB,HCT)
Glucose, Bld: 116 mg/dL — ABNORMAL HIGH (ref 70–99)
HCT: 40 % (ref 36.0–46.0)
Hemoglobin: 13.6 g/dL (ref 12.0–15.0)
Potassium: 4.7 mmol/L (ref 3.5–5.1)
Sodium: 130 mmol/L — ABNORMAL LOW (ref 135–145)

## 2015-04-02 SURGERY — AMPUTATION DIGIT
Anesthesia: General | Site: Foot | Laterality: Bilateral

## 2015-04-02 MED ORDER — HYDROCODONE-ACETAMINOPHEN 5-325 MG PO TABS
1.0000 | ORAL_TABLET | Freq: Four times a day (QID) | ORAL | Status: DC | PRN
Start: 2015-04-02 — End: 2015-04-21

## 2015-04-02 MED ORDER — PROPOFOL 10 MG/ML IV BOLUS
INTRAVENOUS | Status: DC | PRN
  Administered 2015-04-02: 30 mg via INTRAVENOUS
  Administered 2015-04-02: 150 mg via INTRAVENOUS

## 2015-04-02 MED ORDER — LIDOCAINE HCL (CARDIAC) 20 MG/ML IV SOLN
INTRAVENOUS | Status: DC | PRN
  Administered 2015-04-02: 60 mg via INTRAVENOUS

## 2015-04-02 MED ORDER — MIDAZOLAM HCL 5 MG/5ML IJ SOLN
INTRAMUSCULAR | Status: DC | PRN
  Administered 2015-04-02 (×2): 1 mg via INTRAVENOUS

## 2015-04-02 MED ORDER — 0.9 % SODIUM CHLORIDE (POUR BTL) OPTIME
TOPICAL | Status: DC | PRN
  Administered 2015-04-02: 1000 mL

## 2015-04-02 MED ORDER — FENTANYL CITRATE 0.05 MG/ML IJ SOLN
INTRAMUSCULAR | Status: AC
  Administered 2015-04-02: 50 ug via INTRAVENOUS
  Filled 2015-04-02: qty 2

## 2015-04-02 MED ORDER — SODIUM CHLORIDE 0.9 % IV SOLN
INTRAVENOUS | Status: DC
  Administered 2015-04-02 (×2): via INTRAVENOUS

## 2015-04-02 MED ORDER — CEFAZOLIN SODIUM-DEXTROSE 2-3 GM-% IV SOLR
INTRAVENOUS | Status: DC | PRN
  Administered 2015-04-02: 2 g via INTRAVENOUS

## 2015-04-02 MED ORDER — FENTANYL CITRATE 0.05 MG/ML IJ SOLN
25.0000 ug | INTRAMUSCULAR | Status: DC | PRN
  Administered 2015-04-02: 50 ug via INTRAVENOUS

## 2015-04-02 MED ORDER — FENTANYL CITRATE 0.05 MG/ML IJ SOLN
INTRAMUSCULAR | Status: DC | PRN
  Administered 2015-04-02 (×2): 25 ug via INTRAVENOUS

## 2015-04-02 MED ORDER — PROPOFOL 10 MG/ML IV BOLUS
INTRAVENOUS | Status: AC
  Filled 2015-04-02: qty 20

## 2015-04-02 MED ORDER — FENTANYL CITRATE 0.05 MG/ML IJ SOLN
INTRAMUSCULAR | Status: AC
  Filled 2015-04-02: qty 5

## 2015-04-02 MED ORDER — ONDANSETRON HCL 4 MG/2ML IJ SOLN
INTRAMUSCULAR | Status: DC | PRN
  Administered 2015-04-02: 4 mg via INTRAVENOUS

## 2015-04-02 MED ORDER — MIDAZOLAM HCL 2 MG/2ML IJ SOLN
INTRAMUSCULAR | Status: AC
  Filled 2015-04-02: qty 2

## 2015-04-02 SURGICAL SUPPLY — 35 items
BLADE SURG 21 STRL SS (BLADE) ×2 IMPLANT
BNDG CMPR 9X4 STRL LF SNTH (GAUZE/BANDAGES/DRESSINGS) ×1
BNDG COHESIVE 4X5 TAN STRL (GAUZE/BANDAGES/DRESSINGS) ×4 IMPLANT
BNDG COHESIVE 6X5 TAN STRL LF (GAUZE/BANDAGES/DRESSINGS) ×4 IMPLANT
BNDG ESMARK 4X9 LF (GAUZE/BANDAGES/DRESSINGS) ×2 IMPLANT
BNDG GAUZE ELAST 4 BULKY (GAUZE/BANDAGES/DRESSINGS) ×6 IMPLANT
COVER SURGICAL LIGHT HANDLE (MISCELLANEOUS) ×2 IMPLANT
DRAPE EXTREMITY BILATERAL (DRAPE) ×2 IMPLANT
DRAPE U-SHAPE 47X51 STRL (DRAPES) ×2 IMPLANT
DRSG ADAPTIC 3X8 NADH LF (GAUZE/BANDAGES/DRESSINGS) ×4 IMPLANT
DRSG PAD ABDOMINAL 8X10 ST (GAUZE/BANDAGES/DRESSINGS) ×4 IMPLANT
DURAPREP 26ML APPLICATOR (WOUND CARE) ×2 IMPLANT
ELECT REM PT RETURN 9FT ADLT (ELECTROSURGICAL) ×2
ELECTRODE REM PT RTRN 9FT ADLT (ELECTROSURGICAL) ×1 IMPLANT
GAUZE SPONGE 4X4 12PLY STRL (GAUZE/BANDAGES/DRESSINGS) ×4 IMPLANT
GLOVE BIOGEL PI IND STRL 9 (GLOVE) ×1 IMPLANT
GLOVE BIOGEL PI INDICATOR 9 (GLOVE) ×1
GLOVE SURG ORTHO 9.0 STRL STRW (GLOVE) ×2 IMPLANT
GOWN STRL REUS W/ TWL XL LVL3 (GOWN DISPOSABLE) ×2 IMPLANT
GOWN STRL REUS W/TWL XL LVL3 (GOWN DISPOSABLE) ×4
KIT BASIN OR (CUSTOM PROCEDURE TRAY) ×2 IMPLANT
KIT ROOM TURNOVER OR (KITS) ×2 IMPLANT
MANIFOLD NEPTUNE II (INSTRUMENTS) ×2 IMPLANT
NEEDLE 22X1 1/2 (OR ONLY) (NEEDLE) IMPLANT
NS IRRIG 1000ML POUR BTL (IV SOLUTION) ×2 IMPLANT
PACK ORTHO EXTREMITY (CUSTOM PROCEDURE TRAY) ×2 IMPLANT
PAD ARMBOARD 7.5X6 YLW CONV (MISCELLANEOUS) ×4 IMPLANT
SPONGE GAUZE 4X4 12PLY STER LF (GAUZE/BANDAGES/DRESSINGS) ×4 IMPLANT
STOCKINETTE 6  STRL (DRAPES) ×2
STOCKINETTE 6 STRL (DRAPES) ×2 IMPLANT
SUCTION FRAZIER TIP 10 FR DISP (SUCTIONS) ×2 IMPLANT
SUT ETHILON 2 0 PSLX (SUTURE) ×4 IMPLANT
SYR CONTROL 10ML LL (SYRINGE) IMPLANT
TOWEL OR 17X24 6PK STRL BLUE (TOWEL DISPOSABLE) ×2 IMPLANT
TOWEL OR 17X26 10 PK STRL BLUE (TOWEL DISPOSABLE) ×2 IMPLANT

## 2015-04-02 NOTE — Anesthesia Procedure Notes (Signed)
Procedure Name: LMA Insertion Performed by: Suzy Bouchard Pre-anesthesia Checklist: Patient identified, Timeout performed, Emergency Drugs available, Suction available and Patient being monitored Patient Re-evaluated:Patient Re-evaluated prior to inductionOxygen Delivery Method: Circle system utilized Preoxygenation: Pre-oxygenation with 100% oxygen Intubation Type: IV induction Ventilation: Mask ventilation without difficulty LMA: LMA inserted LMA Size: 4.0 Placement Confirmation: positive ETCO2 and breath sounds checked- equal and bilateral Dental Injury: Teeth and Oropharynx as per pre-operative assessment

## 2015-04-02 NOTE — Transfer of Care (Signed)
Immediate Anesthesia Transfer of Care Note  Patient: [name]  Procedure(s) Performed: Procedure(s): Amputation Bilateral Great Toes MTP Joint (Bilateral)  Patient Location: PACU  Anesthesia Type:General  Level of Consciousness: sedated  Airway & Oxygen Therapy: Patient Spontanous Breathing and Patient connected to nasal cannula oxygen  Post-op Assessment: Report given to RN and Post -op Vital signs reviewed and stable  Post vital signs: Reviewed and stable  Last Vitals:  Filed Vitals:   04/02/15 1101  BP: 151/79  Pulse: 85  Resp: 18    Complications: No apparent anesthesia complications

## 2015-04-02 NOTE — Anesthesia Postprocedure Evaluation (Signed)
  Anesthesia Post-op Note  Patient: [name]  Procedure(s) Performed: Procedure(s): Amputation Bilateral Great Toes MTP Joint (Bilateral)  Patient Location: PACU  Anesthesia Type:General  Level of Consciousness: awake and alert   Airway and Oxygen Therapy: Patient Spontanous Breathing  Post-op Pain: mild  Post-op Assessment: Post-op Vital signs reviewed  Post-op Vital Signs: Reviewed  Last Vitals:  Filed Vitals:   04/02/15 1645  BP:   Pulse: 71  Temp:   Resp: 15    Complications: No apparent anesthesia complications

## 2015-04-02 NOTE — Progress Notes (Signed)
Call to [facility], reviewed last drink & solid. Also reviewed meds. Spoke with Larene Beach.

## 2015-04-02 NOTE — Progress Notes (Signed)
Call to Dr. Sharol Given, approval rec'd to do only istat, cancel other labs.

## 2015-04-02 NOTE — Op Note (Signed)
04/02/2015  7:47 PM  PATIENT:  [name]    PRE-OPERATIVE DIAGNOSIS:  Osteomyelitis Bilateral Great Toes  POST-OPERATIVE DIAGNOSIS:  Same  PROCEDURE:  Amputation Bilateral Great Toes MTP Joint  SURGEON:  Newt Minion, MD  PHYSICIAN ASSISTANT:None ANESTHESIA:   General  PREOPERATIVE INDICATIONS:  [name] is a  59 y.o. female with a diagnosis of Osteomyelitis Bilateral Great Toes who failed conservative measures and elected for surgical management.    The risks benefits and alternatives were discussed with the patient preoperatively including but not limited to the risks of infection, bleeding, nerve injury, cardiopulmonary complications, the need for revision surgery, among others, and the patient was willing to proceed.  OPERATIVE IMPLANTS: None  OPERATIVE FINDINGS: Abscess involving both great toes  OPERATIVE PROCEDURE: Patient was brought to the operating room and underwent a general anesthetic. After adequate levels of anesthesia were obtained patient's bilateral lower extremities were prepped using DuraPrep draped into a sterile field. A timeout was called. Elliptical incisions were made around the left great toe just distal to the MTP joint. The toe was amputated through the MTP joint. Hemostasis was obtained the wound was irrigated with normal saline and the incision was closed using 2-0 nylon. Attention was then focused to the right lower extremity. A fishmouth incision was made just distal to the MTP joint at the right great toe. The great toe was amputated through the MTP joint. Electrocautery was used for hemostasis. Incision was closed using 2-0 nylon. Both wounds were covered with a sterile compressive dressing. Patient was extubated taken to the PACU in stable condition.

## 2015-04-02 NOTE — Progress Notes (Signed)
Call to R. Ola Spurr, discussed conern for consent for the surgery with her sister  (s), vs. Son who is not present. Also review need for repeat EKG, CXR- no need to repeat either & ok to do ISTAT vs. CBC,BMET, PT,PTT.

## 2015-04-02 NOTE — H&P (Signed)
[name] is an 59 y.o. female.   Chief Complaint: Ulceration osteomyelitis bilateral great toes. HPI: Patient is a 59 year old woman with multiple medical problems including end-stage renal disease and diabetes as well as peripheral vascular disease. Patient has seen vascular surgery and has felt to have adequate circulation. Patient presents with osteomyelitis and ulceration bilateral great toes.  Past Medical History  Diagnosis Date  . Hyperlipidemia   . Hypertension     a. Has previously refused blood pressure meds.   . Deaf     Divorced from husband but lives with him. Has daughter but she does not care for her.  . Bilateral leg edema     a. Chronic.  Marland Kitchen Psychiatric disorder     She frequently exhibits paranoia and has been diagnosed with psychotic d/o NOS during hospital stay in the past. She is tangetial and perseverative during her visits. She apparently has had a bad experience with mental health in [place] in the past and refuses to discuss mental issues for fear that she will be sent back there. Her paranoia, communication issues, financial woes and lack of fam  . Fibroid uterus   . Anemia     Due to fibroids.  . Heart murmur   . Diabetes mellitus     a. Per PCP note 2012 (A1C 9.2) - pt unwilling to take meds and was educated on risk of uncontrolled DM.  b. A1C 5.9 in 11/2013.  Marland Kitchen Coronary artery disease   . MI (myocardial infarction)   . Blind   . ESRD on hemodialysis     Mon, Wed, Fri    Past Surgical History  Procedure Laterality Date  . No past surgeries    . Insertion of dialysis catheter Right 01/02/2014    Procedure: INSERTION OF DIALYSIS CATHETER;  Surgeon: Rosetta Posner, MD;  Location: [facility];  Service: Vascular;  Laterality: Right;  . Av fistula placement Left 01/02/2014    Procedure: ARTERIOVENOUS (AV) FISTULA CREATION;  Surgeon: Rosetta Posner, MD;  Location: [facility] OR;  Service: Vascular;  Laterality: Left;    Family History  Problem Relation Age of Onset  . Other  Father     Drowned from fishing?  . Heart disease     Social History:  reports that she has never smoked. She does not have any smokeless tobacco history on file. She reports that she does not drink alcohol or use illicit drugs.  Allergies: No Known Allergies  No prescriptions prior to admission    No results found for this or any previous visit (from the past 48 hour(s)). No results found.  Review of Systems  All other systems reviewed and are negative.   There were no vitals taken for this visit. Physical Exam  On examination patient has ulceration ostium myelitis bilateral great toes. Assessment/Plan Assessment: Ulceration ostium myelitis bilateral great toes.  Plan: We'll plan for bilateral great toe amputations. Risks and benefits were discussed including the risk of the wound not healing. Patient states she understands wishes to proceed at this time.  Damichael Hofman V 04/02/2015, 6:05 AM

## 2015-04-02 NOTE — Anesthesia Preprocedure Evaluation (Addendum)
Anesthesia Evaluation  Patient identified by MRN, date of birth, ID band Patient awake    Reviewed: Allergy & Precautions, NPO status , Patient's Chart, lab work & pertinent test results  Airway Mallampati: III  TM Distance: >3 FB Neck ROM: Full    Dental   Pulmonary neg pulmonary ROS,  breath sounds clear to auscultation- rhonchi        Cardiovascular hypertension, + CAD (Managed medically), + Past MI, + Peripheral Vascular Disease and +CHF Rhythm:Regular Rate:Normal     Neuro/Psych Deaf/ mute    GI/Hepatic negative GI ROS, Neg liver ROS,   Endo/Other  diabetes  Renal/GU Dialysis and ESRFRenal disease     Musculoskeletal   Abdominal   Peds  Hematology  (+) anemia ,   Anesthesia Other Findings   Reproductive/Obstetrics                            Anesthesia Physical Anesthesia Plan  ASA: III  Anesthesia Plan: General   Post-op Pain Management:    Induction: Intravenous  Airway Management Planned: LMA  Additional Equipment:   Intra-op Plan:   Post-operative Plan: Extubation in OR  Informed Consent: I have reviewed the patients History and Physical, chart, labs and discussed the procedure including the risks, benefits and alternatives for the proposed anesthesia with the patient or authorized representative who has indicated his/her understanding and acceptance.   Dental advisory given  Plan Discussed with: CRNA  Anesthesia Plan Comments:         Anesthesia Quick Evaluation

## 2015-04-06 ENCOUNTER — Encounter (HOSPITAL_COMMUNITY): Payer: Self-pay | Admitting: Orthopedic Surgery

## 2015-04-06 ENCOUNTER — Encounter: Payer: Self-pay | Admitting: Vascular Surgery

## 2015-04-18 ENCOUNTER — Encounter (HOSPITAL_COMMUNITY): Payer: Self-pay | Admitting: Emergency Medicine

## 2015-04-18 ENCOUNTER — Emergency Department (HOSPITAL_COMMUNITY): Payer: Medicare Other

## 2015-04-18 ENCOUNTER — Inpatient Hospital Stay (HOSPITAL_COMMUNITY)
Admission: EM | Admit: 2015-04-18 | Discharge: 2015-04-21 | DRG: 291 | Disposition: A | Discharge status: Skilled Nursing Facility | Payer: Medicare Other | Attending: Internal Medicine | Admitting: Internal Medicine

## 2015-04-18 ENCOUNTER — Other Ambulatory Visit (HOSPITAL_COMMUNITY): Payer: Self-pay

## 2015-04-18 DIAGNOSIS — F419 Anxiety disorder, unspecified: Secondary | ICD-10-CM | POA: Diagnosis not present

## 2015-04-18 DIAGNOSIS — Z7982 Long term (current) use of aspirin: Secondary | ICD-10-CM

## 2015-04-18 DIAGNOSIS — I472 Ventricular tachycardia, unspecified: Secondary | ICD-10-CM

## 2015-04-18 DIAGNOSIS — Z992 Dependence on renal dialysis: Secondary | ICD-10-CM

## 2015-04-18 DIAGNOSIS — E119 Type 2 diabetes mellitus without complications: Secondary | ICD-10-CM | POA: Diagnosis present

## 2015-04-18 DIAGNOSIS — H913 Deaf nonspeaking, not elsewhere classified: Secondary | ICD-10-CM | POA: Diagnosis not present

## 2015-04-18 DIAGNOSIS — S82002D Unspecified fracture of left patella, subsequent encounter for closed fracture with routine healing: Secondary | ICD-10-CM | POA: Diagnosis not present

## 2015-04-18 DIAGNOSIS — I5023 Acute on chronic systolic (congestive) heart failure: Secondary | ICD-10-CM

## 2015-04-18 DIAGNOSIS — I5020 Unspecified systolic (congestive) heart failure: Secondary | ICD-10-CM | POA: Diagnosis not present

## 2015-04-18 DIAGNOSIS — D259 Leiomyoma of uterus, unspecified: Secondary | ICD-10-CM | POA: Diagnosis present

## 2015-04-18 DIAGNOSIS — I509 Heart failure, unspecified: Secondary | ICD-10-CM | POA: Diagnosis not present

## 2015-04-18 DIAGNOSIS — I5041 Acute combined systolic (congestive) and diastolic (congestive) heart failure: Secondary | ICD-10-CM | POA: Diagnosis not present

## 2015-04-18 DIAGNOSIS — J9601 Acute respiratory failure with hypoxia: Secondary | ICD-10-CM | POA: Diagnosis present

## 2015-04-18 DIAGNOSIS — I252 Old myocardial infarction: Secondary | ICD-10-CM

## 2015-04-18 DIAGNOSIS — R0602 Shortness of breath: Secondary | ICD-10-CM | POA: Diagnosis not present

## 2015-04-18 DIAGNOSIS — N186 End stage renal disease: Secondary | ICD-10-CM

## 2015-04-18 DIAGNOSIS — I255 Ischemic cardiomyopathy: Secondary | ICD-10-CM | POA: Diagnosis present

## 2015-04-18 DIAGNOSIS — Z794 Long term (current) use of insulin: Secondary | ICD-10-CM

## 2015-04-18 DIAGNOSIS — I120 Hypertensive chronic kidney disease with stage 5 chronic kidney disease or end stage renal disease: Secondary | ICD-10-CM | POA: Diagnosis present

## 2015-04-18 DIAGNOSIS — R079 Chest pain, unspecified: Secondary | ICD-10-CM | POA: Diagnosis not present

## 2015-04-18 DIAGNOSIS — I10 Essential (primary) hypertension: Secondary | ICD-10-CM | POA: Diagnosis present

## 2015-04-18 DIAGNOSIS — I2510 Atherosclerotic heart disease of native coronary artery without angina pectoris: Secondary | ICD-10-CM | POA: Diagnosis present

## 2015-04-18 DIAGNOSIS — D649 Anemia, unspecified: Secondary | ICD-10-CM | POA: Diagnosis present

## 2015-04-18 DIAGNOSIS — J9600 Acute respiratory failure, unspecified whether with hypoxia or hypercapnia: Secondary | ICD-10-CM | POA: Diagnosis present

## 2015-04-18 DIAGNOSIS — F29 Unspecified psychosis not due to a substance or known physiological condition: Secondary | ICD-10-CM | POA: Diagnosis not present

## 2015-04-18 DIAGNOSIS — F4320 Adjustment disorder, unspecified: Secondary | ICD-10-CM | POA: Diagnosis not present

## 2015-04-18 DIAGNOSIS — E8770 Fluid overload, unspecified: Secondary | ICD-10-CM | POA: Diagnosis not present

## 2015-04-18 DIAGNOSIS — R0600 Dyspnea, unspecified: Secondary | ICD-10-CM | POA: Diagnosis not present

## 2015-04-18 DIAGNOSIS — Z66 Do not resuscitate: Secondary | ICD-10-CM | POA: Diagnosis present

## 2015-04-18 DIAGNOSIS — I5021 Acute systolic (congestive) heart failure: Secondary | ICD-10-CM | POA: Diagnosis not present

## 2015-04-18 DIAGNOSIS — Z7901 Long term (current) use of anticoagulants: Secondary | ICD-10-CM

## 2015-04-18 DIAGNOSIS — M19042 Primary osteoarthritis, left hand: Secondary | ICD-10-CM | POA: Diagnosis not present

## 2015-04-18 DIAGNOSIS — H9190 Unspecified hearing loss, unspecified ear: Secondary | ICD-10-CM

## 2015-04-18 DIAGNOSIS — N189 Chronic kidney disease, unspecified: Secondary | ICD-10-CM | POA: Diagnosis not present

## 2015-04-18 DIAGNOSIS — E785 Hyperlipidemia, unspecified: Secondary | ICD-10-CM | POA: Diagnosis present

## 2015-04-18 DIAGNOSIS — I5043 Acute on chronic combined systolic (congestive) and diastolic (congestive) heart failure: Secondary | ICD-10-CM | POA: Diagnosis not present

## 2015-04-18 DIAGNOSIS — E875 Hyperkalemia: Secondary | ICD-10-CM

## 2015-04-18 DIAGNOSIS — I5189 Other ill-defined heart diseases: Secondary | ICD-10-CM | POA: Diagnosis present

## 2015-04-18 DIAGNOSIS — Z7382 Dual sensory impairment: Secondary | ICD-10-CM | POA: Diagnosis not present

## 2015-04-18 DIAGNOSIS — J810 Acute pulmonary edema: Secondary | ICD-10-CM

## 2015-04-18 DIAGNOSIS — H547 Unspecified visual loss: Secondary | ICD-10-CM

## 2015-04-18 DIAGNOSIS — J811 Chronic pulmonary edema: Secondary | ICD-10-CM | POA: Diagnosis not present

## 2015-04-18 DIAGNOSIS — H540 Blindness, both eyes: Secondary | ICD-10-CM | POA: Diagnosis present

## 2015-04-18 DIAGNOSIS — I2109 ST elevation (STEMI) myocardial infarction involving other coronary artery of anterior wall: Secondary | ICD-10-CM | POA: Diagnosis present

## 2015-04-18 DIAGNOSIS — I213 ST elevation (STEMI) myocardial infarction of unspecified site: Secondary | ICD-10-CM | POA: Diagnosis present

## 2015-04-18 LAB — I-STAT CHEM 8, ED
BUN: 89 mg/dL — ABNORMAL HIGH (ref 6–23)
Calcium, Ion: 1 mmol/L — ABNORMAL LOW (ref 1.12–1.23)
Chloride: 90 mmol/L — ABNORMAL LOW (ref 96–112)
Creatinine, Ser: 6.5 mg/dL — ABNORMAL HIGH (ref 0.50–1.10)
Glucose, Bld: 216 mg/dL — ABNORMAL HIGH (ref 70–99)
HCT: 42 % (ref 36.0–46.0)
Hemoglobin: 14.3 g/dL (ref 12.0–15.0)
Potassium: 7.3 mmol/L (ref 3.5–5.1)
Sodium: 123 mmol/L — ABNORMAL LOW (ref 135–145)
TCO2: 24 mmol/L (ref 0–100)

## 2015-04-18 LAB — CBC WITH DIFFERENTIAL/PLATELET
Basophils Absolute: 0.1 10*3/uL (ref 0.0–0.1)
Basophils Relative: 0 % (ref 0–1)
Eosinophils Absolute: 0.2 10*3/uL (ref 0.0–0.7)
Eosinophils Relative: 1 % (ref 0–5)
HCT: 35.4 % — ABNORMAL LOW (ref 36.0–46.0)
Hemoglobin: 11.5 g/dL — ABNORMAL LOW (ref 12.0–15.0)
Lymphocytes Relative: 8 % — ABNORMAL LOW (ref 12–46)
Lymphs Abs: 1.2 10*3/uL (ref 0.7–4.0)
MCH: 31 pg (ref 26.0–34.0)
MCHC: 32.5 g/dL (ref 30.0–36.0)
MCV: 95.4 fL (ref 78.0–100.0)
Monocytes Absolute: 0.9 10*3/uL (ref 0.1–1.0)
Monocytes Relative: 6 % (ref 3–12)
Neutro Abs: 12.1 10*3/uL — ABNORMAL HIGH (ref 1.7–7.7)
Neutrophils Relative %: 85 % — ABNORMAL HIGH (ref 43–77)
Platelets: 409 10*3/uL — ABNORMAL HIGH (ref 150–400)
RBC: 3.71 MIL/uL — ABNORMAL LOW (ref 3.87–5.11)
RDW: 16.6 % — ABNORMAL HIGH (ref 11.5–15.5)
WBC: 14.4 10*3/uL — ABNORMAL HIGH (ref 4.0–10.5)

## 2015-04-18 LAB — I-STAT TROPONIN, ED: Troponin i, poc: 0.04 ng/mL (ref 0.00–0.08)

## 2015-04-18 LAB — BRAIN NATRIURETIC PEPTIDE: B Natriuretic Peptide: 2821.7 pg/mL — ABNORMAL HIGH (ref 0.0–100.0)

## 2015-04-18 MED ORDER — NITROGLYCERIN IN D5W 200-5 MCG/ML-% IV SOLN
0.0000 ug/min | Freq: Once | INTRAVENOUS | Status: AC
  Administered 2015-04-18: 20 ug/min via INTRAVENOUS
  Filled 2015-04-18: qty 250

## 2015-04-18 MED ORDER — ASPIRIN EC 325 MG PO TBEC: 325.0000 mg | DELAYED_RELEASE_TABLET | Freq: Once | ORAL | Status: DC

## 2015-04-18 MED ORDER — FUROSEMIDE 10 MG/ML IJ SOLN
40.0000 mg | Freq: Once | INTRAMUSCULAR | Status: AC
  Administered 2015-04-18: 40 mg via INTRAVENOUS
  Filled 2015-04-18: qty 4

## 2015-04-18 NOTE — Consult Note (Signed)
Renal Service Consult Note [place] 04/18/2015 Roney Jaffe D Requesting Physician:  Dr Vanita Panda  Reason for Consult:  ESRD pt with resp distress, high K HPI: The patient is a 59 y.o. year-old with hx of HTN, DM, ESRD. She was dx'd w ischemic CM and VTach in Nov/Dec 2015, medical Rx per cardiology and not ICD candidate. Last admit for bronchitis Jan 2016. Presented from SNF for chest pain, on BiPap on arrival.  Also had some n/v.  Last HD Fri. CXR showed diffuse pulm edema. K 7.3 w/o EKG changes. Asked to see for dialysis.   Patient is off Bipap now, stable and breathing better. 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      Chart review: 11/05 - acute blood loss anemia, menometrorrhagia, uterine fibroids 10/10 - uncontrolled HTN/ DM rx with BP meds and insulin; seen by psych for question of paranoia about seeing [facility] providers, was not found to have any frank psychosis. Also deaf and communicated with sign language 1/11 - RLE edema admitted for possible cellulitis, dopplers negative, dc'd home. DM with foot ulcer R third toe, HTN, deaf 12/14 - SOB, gen weakness, SVT w rates 170's > admitted , rx with empiric AB for PNA, renal failure creat 3.5, vol excess not responding to diuretics > started on HD. Improved with HD. DC'd to SNF, outpatiinet HD set up 11/26 - 11/26/14 > acute ant wall MI, resp failure/ hypoxia, ichemic CM EF30%, ESRD, HTN, DM, blind, nonpsychotic mental disorder; presented SOB x 2 wks, CP, pulm edema, K 6.7 rx with meds and had acute HD w 5L removed. NSTEMI rx'd w IV hep. Stress test 12/1 showed fixed defects apex, ant/lat wall and inf wall. No Ischemia. Plan medical Rx.   12/18 - 12/21/14 > found unconscious at SNF after HD. Pulseless and rec'd CPR 6 minute, AED nonshockable rhythm. In ED developed VTach w acute SOB/ HTN urgency. Rx bipap, IV ntg/ amio.  Admitted to ICU and decompensated again. Resp issues responded to acute HD.   Cardiology rec'd not a candidate for AICD, recommended discussion w family for [place].  Pallative care met w pt's son. He was not ready to make her DNR or comfort care.  He understood her prognosis was guarded.  1/20 - 01/16/15 > anorexia, cough > admitted and Rx'd for acute bronchitis; CXR neg, HTN, DM w A1C of 6 %   Past Medical History  Past Medical History  Diagnosis Date  . Hyperlipidemia   . Hypertension     a. Has previously refused blood pressure meds.   . Deaf     Divorced from husband but lives with him. Has daughter but she does not care for her.  . Bilateral leg edema     a. Chronic.  Marland Kitchen Psychiatric disorder     She frequently exhibits paranoia and has been diagnosed with psychotic d/o NOS during hospital stay in the past. She is tangetial and perseverative during her visits. She apparently has had a bad experience with mental health in [place] in the past and refuses to discuss mental issues for fear that she will be sent back there. Her paranoia, communication issues, financial woes and lack of fam  . Fibroid uterus   . Anemia     Due to fibroids.  . Heart murmur   . Diabetes mellitus     a. Per PCP note 2012 (A1C 9.2) - pt unwilling to take meds and was educated on  risk of uncontrolled DM.  b. A1C 5.9 in 11/2013.  Marland Kitchen Coronary artery disease   . Blind   . ESRD on hemodialysis     Mon, Wed, Fri  . CHF (congestive heart failure)   . MI (myocardial infarction) 11/2014   Past Surgical History  Past Surgical History  Procedure Laterality Date  . No past surgeries    . Insertion of dialysis catheter Right 01/02/2014    Procedure: INSERTION OF DIALYSIS CATHETER;  Surgeon: Rosetta Posner, MD;  Location: [place];  Service: Vascular;  Laterality: Right;  . Av fistula placement Left 01/02/2014    Procedure: ARTERIOVENOUS (AV) FISTULA CREATION;  Surgeon: Rosetta Posner, MD;  Location: [place];  Service: Vascular;  Laterality: Left;  . Amputation Bilateral 04/02/2015    Procedure: Amputation Bilateral  Great Toes MTP Joint;  Surgeon: Newt Minion, MD;  Location: [place];  Service: Orthopedics;  Laterality: Bilateral;   Family History  Family History  Problem Relation Age of Onset  . Other Father     Drowned from fishing?  . Heart disease     Social History  reports that she has never smoked. She does not have any smokeless tobacco history on file. She reports that she does not drink alcohol or use illicit drugs. Allergies No Known Allergies Home medications Prior to Admission medications   Medication Sig Start Date End Date Taking? Authorizing Provider  amiodarone (PACERONE) 200 MG tablet Take 200 mg by mouth daily.   Yes Historical Provider, MD  aspirin 81 MG tablet Take 81 mg by mouth daily. For hypertension   Yes Historical Provider, MD  atorvastatin (LIPITOR) 10 MG tablet Take 10 mg by mouth daily.   Yes Historical Provider, MD  calcium acetate (PHOSLO) 667 MG capsule Take 1 capsule (667 mg total) by mouth 3 (three) times daily with meals. 01/08/14  Yes Kelvin Cellar, MD  clopidogrel (PLAVIX) 75 MG tablet Take 1 tablet (75 mg total) by mouth daily. 11/26/14  Yes Ripudeep Krystal Eaton, MD  Docusate Sodium (DSS) 100 MG CAPS Take 100 mg by mouth 2 (two) times daily. For constipation 01/08/14  Yes Kelvin Cellar, MD  guaiFENesin (MUCINEX) 600 MG 12 hr tablet Take 1 tablet (600 mg total) by mouth 2 (two) times daily. 01/16/15  Yes Samella Parr, NP  lisinopril (PRINIVIL,ZESTRIL) 2.5 MG tablet Take 1 tablet (2.5 mg total) by mouth daily. 11/26/14  Yes Ripudeep Krystal Eaton, MD  metoprolol succinate (TOPROL-XL) 50 MG 24 hr tablet Take 1 tablet (50 mg total) by mouth daily. Take with or immediately following a meal. 11/26/14  Yes Ripudeep Krystal Eaton, MD  Multiple Vitamin (MULTIVITAMIN WITH MINERALS) TABS tablet Take 2 tablets by mouth daily.   Yes Historical Provider, MD  acetaminophen (TYLENOL) 325 MG tablet Take 650 mg by mouth 3 (three) times daily as needed.    Historical Provider, MD  Amino Acids-Protein  Hydrolys (FEEDING SUPPLEMENT, PRO-STAT SUGAR FREE 64,) LIQD Take 30 mLs by mouth 3 (three) times daily with meals. Patient not taking: Reported on 04/18/2015 12/21/14   Delfina Redwood, MD  HYDROcodone-acetaminophen (NORCO) 5-325 MG per tablet Take 1 tablet by mouth every 6 (six) hours as needed. 04/02/15   Newt Minion, MD  lactulose (CHRONULAC) 10 GM/15ML solution Take 30 mLs (20 g total) by mouth 2 (two) times daily as needed for moderate constipation. 01/16/15   Samella Parr, NP  levalbuterol Penne Lash) 0.63 MG/3ML nebulizer solution Take 3 mLs (0.63 mg total)  by nebulization every 2 (two) hours as needed for wheezing or shortness of breath. 01/16/15   Samella Parr, NP  nitroGLYCERIN (NITROSTAT) 0.4 MG SL tablet Place 1 tablet (0.4 mg total) under the tongue every 5 (five) minutes as needed for chest pain. 11/26/14   Ripudeep Krystal Eaton, MD   Liver Function Tests No results for input(s): AST, ALT, ALKPHOS, BILITOT, PROT, ALBUMIN in the last 168 hours. No results for input(s): LIPASE, AMYLASE in the last 168 hours. CBC  Recent Labs Lab 04/18/15 2255 04/18/15 2311  WBC 14.4*  --   NEUTROABS 12.1*  --   HGB 11.5* 14.3  HCT 35.4* 42.0  MCV 95.4  --   PLT 409*  --    Basic Metabolic Panel  Recent Labs Lab 04/18/15 2311  NA 123*  K 7.3*  CL 90*  GLUCOSE 216*  BUN 89*  CREATININE 6.50*    Filed Vitals:   04/18/15 2325 04/18/15 2330 04/18/15 2335 04/18/15 2340  BP: 150/67 152/79 149/70 151/78  Pulse: 83 84 83 82  Resp:  _0 SpO2: 100% 99% 99% 99%   Exam Alert, not in distress No rash, cyanosis or gangrene Sclera anicteric, throat clear +JVD Chest bibasilar rales, occ rhonchi and exp wheeze RRR no MRG Abd soft, NTND, no ascites 1+ bilat LE edema Bilat feet wrapped in gauze Neuro moves all extremities, nonfocal grossly LUA AVF +bruit  CXR diffuse pulm edema  HD: from last admit jan 2016 > MWF [place]   80kg  4h 74mn  Heparin 9000  LUA AVF   Assessment: 1  Resp distress/ pulm edema - suspect vol overload and/or ischemia 2 Hyperkalemia 3 ESRD on HD 4 Ischemic CM 5 Hx VT 12/15 6 HTN uncont\ 7 Blind/ deaf/ mute   Plan- HD tonight, see orders  RKelly SplinterMD (pgr) [phone]   (c) [phone]/24/2016, 11:51 PM

## 2015-04-18 NOTE — ED Notes (Addendum)
Per ems report pt c/o cp 1 hr PTA; pt deaf/blind; no ASA given, no nitro given; DNR code status; ems reports pt lethargic on scene- improved with CPAP; pt also had  "a few episodes of emesis"- 4mg  zofran given enroute; per ems report "fluid in all lung fields"; last HD was friday

## 2015-04-18 NOTE — ED Notes (Signed)
Dr Vanita Panda given a copy of chem 8

## 2015-04-18 NOTE — Consult Note (Signed)
CARDIOLOGY CONSULT NOTE   Patient ID: KEYANAH OVER MRN: [id number], DOB/AGE: [id number]   Admit date: 04/18/2015 Date of Consult: 04/18/2015   Primary Physician: [name], DO Primary Cardiologist: [place]  Pt. Profile  Ms. Tarran Golly is a 59 yo woman with PMH of deafness, blindness, hypertension, dyslipidemia, T2DM, ESRD on hemodialysis Monday/Wednesday/Friday, large missed anterior MI (diagnosed 11/20/14) c/b ischemic cardiomyopathy (EF 30-35%, dyskinesis and aneurysm of anterior wall), VT arrest without ICD implantation due to poor prognosis, who presents with chest pain, dyspnea, and flash pulmonary edema and hyperkalemia.   Problem List  Past Medical History  Diagnosis Date  . Hyperlipidemia   . Hypertension     a. Has previously refused blood pressure meds.   . Deaf     Divorced from husband but lives with him. Has daughter but she does not care for her.  . Bilateral leg edema     a. Chronic.  Marland Kitchen Psychiatric disorder     She frequently exhibits paranoia and has been diagnosed with psychotic d/o NOS during hospital stay in the past. She is tangetial and perseverative during her visits. She apparently has had a bad experience with mental health in [place] in the past and refuses to discuss mental issues for fear that she will be sent back there. Her paranoia, communication issues, financial woes and lack of fam  . Fibroid uterus   . Anemia     Due to fibroids.  . Heart murmur   . Diabetes mellitus     a. Per PCP note 2012 (A1C 9.2) - pt unwilling to take meds and was educated on risk of uncontrolled DM.  b. A1C 5.9 in 11/2013.  Marland Kitchen Coronary artery disease   . Blind   . ESRD on hemodialysis     Mon, Wed, Fri  . CHF (congestive heart failure)   . MI (myocardial infarction) 11/2014    Past Surgical History  Procedure Laterality Date  . No past surgeries    . Insertion of dialysis catheter Right 01/02/2014    Procedure: INSERTION OF DIALYSIS CATHETER;  Surgeon: Rosetta Posner, MD;  Location: [place];  Service: Vascular;  Laterality: Right;  . Av fistula placement Left 01/02/2014    Procedure: ARTERIOVENOUS (AV) FISTULA CREATION;  Surgeon: Rosetta Posner, MD;  Location: [place];  Service: Vascular;  Laterality: Left;  . Amputation Bilateral 04/02/2015    Procedure: Amputation Bilateral Great Toes MTP Joint;  Surgeon: Newt Minion, MD;  Location: [place];  Service: Orthopedics;  Laterality: Bilateral;     Allergies  No Known Allergies  HPI   Ms. Carlett First is a 59 yo woman with PMH of deafness, blindness, hypertension, dyslipidemia, T2DM, ESRD on hemodialysis Monday/Wednesday/Friday, large missed anterior MI (diagnosed 11/20/14) c/b ischemic cardiomyopathy (EF 30-35%, dyskinesis and aneurysm of anterior wall), VT arrest without ICD implantation due to poor prognosis, who presents with chest pain, dyspnea, and possible ? STEMI.   History per EMS and a history cannot be obtained from the patient. Ms. Moala developed chest pain and dyspnea at the nursing home. EMS was called and found her to be hypertensive to nearly 266mmHg, in profound respiratory distress with diffuse rales consistent with pulmonary edema. Initially unable to use CPAP due to emesis but with zofran it was applied with good result. ECG obtained by EMS demonstrated sinus tach with diffuse anterior STE and anterior Q waves, inferior Q waves, and lateral TWI. On this basis, STEMI was called from the field.  Given history of DNR status, STEMI was cancelled in the field.   On arrival to the ER, the patient was hypertensive to 185/87 and hypoxemic to 70% which improved to 94% shortly after arrival. ECG was compared to prior (01/14/15) and found to be unchanged compared to prior. She was given IV NTG and 40mg  IV lasix. Labs were notable for Na 123, K 7.3, Cr 6.5, POC TnI 0.04, WBC 14.4. CXR demonstrated diffuse pulmonary edema and small bilateral effusions.   Per report, there have been no missed dialysis sessions.    Inpatient Medications  . aspirin EC  325 mg Oral Once    Family History Family History  Problem Relation Age of Onset  . Other Father     Drowned from fishing?  . Heart disease       Social History History   Social History  . Marital Status: Married    Spouse Name: N/A  . Number of Children: N/A  . Years of Education: N/A   Occupational History  . Not on file.   Social History Main Topics  . Smoking status: Never Smoker   . Smokeless tobacco: Not on file     Comment: Prior dip  . Alcohol Use: No  . Drug Use: No  . Sexual Activity: Not on file   Other Topics Concern  . Not on file   Social History Narrative     Review of Systems  Unable to obtain  Physical Exam  Blood pressure 152/79, pulse 84, resp. rate 14, SpO2 99 %.  General: Chronically ill appearing. Moderate respiratory distrress.  Psych: Pleasant Neuro: Alert  Moves all extremities spontaneously. HEENT: CPAP in place  Neck: Supple. Neck veins distended. Lungs:  Diffuse rales anteriorly Heart: Tachycardic, regular, no murmurs. Abdomen: Soft, non-tender, non-distended, BS + x 4.  Extremities: No clubbing, cyanosis. Trace to 1+ LE edema. LUE fistula with thrill.  Labs  No results for input(s): CKTOTAL, CKMB, TROPONINI in the last 72 hours. Lab Results  Component Value Date   WBC 14.4* 04/18/2015   HGB 14.3 04/18/2015   HCT 42.0 04/18/2015   MCV 95.4 04/18/2015   PLT 409* 04/18/2015    Recent Labs Lab 04/18/15 2311  NA 123*  K 7.3*  CL 90*  BUN 89*  CREATININE 6.50*  GLUCOSE 216*   Lab Results  Component Value Date   CHOL 237* 02/09/2010   HDL 51 02/09/2010   LDLCALC 170* 02/09/2010   TRIG 78 02/09/2010   Lab Results  Component Value Date   DDIMER  01/08/2010    <0.22        AT THE INHOUSE ESTABLISHED CUTOFF VALUE OF 0.48 ug/mL FEU, THIS ASSAY HAS BEEN DOCUMENTED IN THE LITERATURE TO HAVE A SENSITIVITY AND NEGATIVE PREDICTIVE VALUE OF AT LEAST 98 TO 99%.  THE TEST  RESULT SHOULD BE CORRELATED WITH AN ASSESSMENT OF THE CLINICAL PROBABILITY OF DVT / VTE.    Radiology/Studies  No results found.  ECG  ECG obtained by EMS demonstrated sinus tach with diffuse anterior STE and anterior Q waves, inferior Q waves, and lateral TWI.  ASSESSMENT AND PLAN  Ms. Rahaf Balbo is a 59 yo woman with PMH of deafness, blindness, hypertension, dyslipidemia, T2DM, ESRD on hemodialysis Monday/Wednesday/Friday, large missed anterior MI (diagnosed 11/20/14) c/b ischemic cardiomyopathy (EF 30-35%, dyskinesis and aneurysm of anterior wall), VT arrest without ICD implantation due to poor prognosis, who presents with chest pain, dyspnea, hyperkalemia.   This presentation is most consistent with flash pulmonary  edema in the setting of hypertensive emergency rather than anterior STEMI which is not really a possibility as we know she has infarcted her anterior wall. The persistent STE are likely related to aneurysmal changes. In the setting of hyperkalemia, it is possible that the presentation is due to insufficient dialysis, although she does not have history to support that.   1. Please consult with renal regarding urgent dialysis given volume overload and hyperkalemia. Hold lisinopril given hyperK 2. In the mean time, treat hypertensive emergency with IV NTG and home oral metoprolol. Goal SBP ~140, 3. Agree with trial of IV lasix but suspect she may require dialysis for volume removal.  4. Cycle troponins; I would expect a small bump based on the clinical setting. Would not heparinize unless she has a dramatic rise in her Tn. She is a poor PCI candidate given comorbidities. 5. Please continue ASA 81mg  daily, plavix 75mg  daily, metoprolol as above, amiodarone 200mg  daily.  6. We will follow.  Tobi Bastos, MD 04/18/2015, 11:36 PM

## 2015-04-18 NOTE — ED Notes (Signed)
Tommi Rumps, MD at bedside. Cardiology

## 2015-04-19 ENCOUNTER — Encounter (HOSPITAL_COMMUNITY): Payer: Self-pay | Admitting: Internal Medicine

## 2015-04-19 DIAGNOSIS — N189 Chronic kidney disease, unspecified: Secondary | ICD-10-CM | POA: Diagnosis not present

## 2015-04-19 DIAGNOSIS — I5041 Acute combined systolic (congestive) and diastolic (congestive) heart failure: Secondary | ICD-10-CM | POA: Diagnosis not present

## 2015-04-19 DIAGNOSIS — E875 Hyperkalemia: Secondary | ICD-10-CM | POA: Diagnosis present

## 2015-04-19 DIAGNOSIS — I2510 Atherosclerotic heart disease of native coronary artery without angina pectoris: Secondary | ICD-10-CM | POA: Diagnosis present

## 2015-04-19 DIAGNOSIS — I472 Ventricular tachycardia: Secondary | ICD-10-CM | POA: Diagnosis not present

## 2015-04-19 DIAGNOSIS — E8770 Fluid overload, unspecified: Secondary | ICD-10-CM | POA: Diagnosis not present

## 2015-04-19 DIAGNOSIS — M19042 Primary osteoarthritis, left hand: Secondary | ICD-10-CM | POA: Diagnosis not present

## 2015-04-19 DIAGNOSIS — D259 Leiomyoma of uterus, unspecified: Secondary | ICD-10-CM | POA: Diagnosis present

## 2015-04-19 DIAGNOSIS — I252 Old myocardial infarction: Secondary | ICD-10-CM | POA: Diagnosis not present

## 2015-04-19 DIAGNOSIS — I5021 Acute systolic (congestive) heart failure: Secondary | ICD-10-CM

## 2015-04-19 DIAGNOSIS — I5020 Unspecified systolic (congestive) heart failure: Secondary | ICD-10-CM | POA: Diagnosis not present

## 2015-04-19 DIAGNOSIS — N186 End stage renal disease: Secondary | ICD-10-CM | POA: Diagnosis not present

## 2015-04-19 DIAGNOSIS — Z66 Do not resuscitate: Secondary | ICD-10-CM | POA: Diagnosis present

## 2015-04-19 DIAGNOSIS — H540 Blindness, both eyes: Secondary | ICD-10-CM | POA: Diagnosis present

## 2015-04-19 DIAGNOSIS — F29 Unspecified psychosis not due to a substance or known physiological condition: Secondary | ICD-10-CM | POA: Diagnosis not present

## 2015-04-19 DIAGNOSIS — Z794 Long term (current) use of insulin: Secondary | ICD-10-CM | POA: Diagnosis not present

## 2015-04-19 DIAGNOSIS — Z7982 Long term (current) use of aspirin: Secondary | ICD-10-CM | POA: Diagnosis not present

## 2015-04-19 DIAGNOSIS — J9601 Acute respiratory failure with hypoxia: Secondary | ICD-10-CM | POA: Diagnosis not present

## 2015-04-19 DIAGNOSIS — R0600 Dyspnea, unspecified: Secondary | ICD-10-CM | POA: Diagnosis present

## 2015-04-19 DIAGNOSIS — H913 Deaf nonspeaking, not elsewhere classified: Secondary | ICD-10-CM | POA: Diagnosis present

## 2015-04-19 DIAGNOSIS — I5043 Acute on chronic combined systolic (congestive) and diastolic (congestive) heart failure: Secondary | ICD-10-CM | POA: Diagnosis not present

## 2015-04-19 DIAGNOSIS — S82002D Unspecified fracture of left patella, subsequent encounter for closed fracture with routine healing: Secondary | ICD-10-CM | POA: Diagnosis not present

## 2015-04-19 DIAGNOSIS — F4320 Adjustment disorder, unspecified: Secondary | ICD-10-CM | POA: Diagnosis not present

## 2015-04-19 DIAGNOSIS — E119 Type 2 diabetes mellitus without complications: Secondary | ICD-10-CM | POA: Diagnosis not present

## 2015-04-19 DIAGNOSIS — I509 Heart failure, unspecified: Secondary | ICD-10-CM | POA: Diagnosis not present

## 2015-04-19 DIAGNOSIS — I10 Essential (primary) hypertension: Secondary | ICD-10-CM | POA: Diagnosis not present

## 2015-04-19 DIAGNOSIS — D649 Anemia, unspecified: Secondary | ICD-10-CM | POA: Diagnosis present

## 2015-04-19 DIAGNOSIS — Z992 Dependence on renal dialysis: Secondary | ICD-10-CM | POA: Diagnosis not present

## 2015-04-19 DIAGNOSIS — I120 Hypertensive chronic kidney disease with stage 5 chronic kidney disease or end stage renal disease: Secondary | ICD-10-CM | POA: Diagnosis present

## 2015-04-19 DIAGNOSIS — F419 Anxiety disorder, unspecified: Secondary | ICD-10-CM | POA: Diagnosis not present

## 2015-04-19 DIAGNOSIS — R0602 Shortness of breath: Secondary | ICD-10-CM | POA: Diagnosis not present

## 2015-04-19 DIAGNOSIS — J811 Chronic pulmonary edema: Secondary | ICD-10-CM | POA: Diagnosis not present

## 2015-04-19 DIAGNOSIS — E785 Hyperlipidemia, unspecified: Secondary | ICD-10-CM | POA: Diagnosis not present

## 2015-04-19 DIAGNOSIS — Z7901 Long term (current) use of anticoagulants: Secondary | ICD-10-CM | POA: Diagnosis not present

## 2015-04-19 DIAGNOSIS — I255 Ischemic cardiomyopathy: Secondary | ICD-10-CM | POA: Diagnosis present

## 2015-04-19 DIAGNOSIS — Z7382 Dual sensory impairment: Secondary | ICD-10-CM | POA: Diagnosis not present

## 2015-04-19 LAB — CBC
HCT: 31.9 % — ABNORMAL LOW (ref 36.0–46.0)
Hemoglobin: 10.3 g/dL — ABNORMAL LOW (ref 12.0–15.0)
MCH: 30.5 pg (ref 26.0–34.0)
MCHC: 32.3 g/dL (ref 30.0–36.0)
MCV: 94.4 fL (ref 78.0–100.0)
Platelets: 400 10*3/uL (ref 150–400)
RBC: 3.38 MIL/uL — ABNORMAL LOW (ref 3.87–5.11)
RDW: 16.5 % — ABNORMAL HIGH (ref 11.5–15.5)
WBC: 9.5 10*3/uL (ref 4.0–10.5)

## 2015-04-19 LAB — BASIC METABOLIC PANEL
Anion gap: 13 (ref 5–15)
BUN: 22 mg/dL (ref 6–23)
CO2: 28 mmol/L (ref 19–32)
Calcium: 8.7 mg/dL (ref 8.4–10.5)
Chloride: 94 mmol/L — ABNORMAL LOW (ref 96–112)
Creatinine, Ser: 3.8 mg/dL — ABNORMAL HIGH (ref 0.50–1.10)
GFR calc Af Amer: 14 mL/min — ABNORMAL LOW (ref >90)
GFR calc non Af Amer: 12 mL/min — ABNORMAL LOW (ref >90)
Glucose, Bld: 108 mg/dL — ABNORMAL HIGH (ref 70–99)
Potassium: 3.7 mmol/L (ref 3.5–5.1)
Sodium: 135 mmol/L (ref 135–145)

## 2015-04-19 LAB — COMPREHENSIVE METABOLIC PANEL
ALT: 69 U/L — ABNORMAL HIGH (ref 0–35)
ALT: 52 U/L — ABNORMAL HIGH (ref 0–35)
AST: 108 U/L — ABNORMAL HIGH (ref 0–37)
AST: 38 U/L — ABNORMAL HIGH (ref 0–37)
Albumin: 3.3 g/dL — ABNORMAL LOW (ref 3.5–5.2)
Albumin: 3.1 g/dL — ABNORMAL LOW (ref 3.5–5.2)
Alkaline Phosphatase: 200 U/L — ABNORMAL HIGH (ref 39–117)
Alkaline Phosphatase: 183 U/L — ABNORMAL HIGH (ref 39–117)
Anion gap: 15 (ref 5–15)
Anion gap: 13 (ref 5–15)
BUN: 60 mg/dL — ABNORMAL HIGH (ref 6–23)
BUN: 22 mg/dL (ref 6–23)
CO2: 23 mmol/L (ref 19–32)
CO2: 28 mmol/L (ref 19–32)
Calcium: 8.5 mg/dL (ref 8.4–10.5)
Calcium: 8.7 mg/dL (ref 8.4–10.5)
Chloride: 84 mmol/L — ABNORMAL LOW (ref 96–112)
Chloride: 94 mmol/L — ABNORMAL LOW (ref 96–112)
Creatinine, Ser: 6.78 mg/dL — ABNORMAL HIGH (ref 0.50–1.10)
Creatinine, Ser: 3.75 mg/dL — ABNORMAL HIGH (ref 0.50–1.10)
GFR calc Af Amer: 7 mL/min — ABNORMAL LOW (ref >90)
GFR calc Af Amer: 14 mL/min — ABNORMAL LOW (ref >90)
GFR calc non Af Amer: 6 mL/min — ABNORMAL LOW (ref >90)
GFR calc non Af Amer: 12 mL/min — ABNORMAL LOW (ref >90)
Glucose, Bld: 201 mg/dL — ABNORMAL HIGH (ref 70–99)
Glucose, Bld: 108 mg/dL — ABNORMAL HIGH (ref 70–99)
Potassium: 7.2 mmol/L (ref 3.5–5.1)
Potassium: 3.6 mmol/L (ref 3.5–5.1)
Sodium: 122 mmol/L — ABNORMAL LOW (ref 135–145)
Sodium: 135 mmol/L (ref 135–145)
Total Bilirubin: 1.5 mg/dL — ABNORMAL HIGH (ref 0.3–1.2)
Total Bilirubin: 0.7 mg/dL (ref 0.3–1.2)
Total Protein: 8.7 g/dL — ABNORMAL HIGH (ref 6.0–8.3)
Total Protein: 7.8 g/dL (ref 6.0–8.3)

## 2015-04-19 LAB — GLUCOSE, CAPILLARY
Glucose-Capillary: 97 mg/dL (ref 70–99)
Glucose-Capillary: 127 mg/dL — ABNORMAL HIGH (ref 70–99)
Glucose-Capillary: 107 mg/dL — ABNORMAL HIGH (ref 70–99)
Glucose-Capillary: 92 mg/dL (ref 70–99)

## 2015-04-19 LAB — TROPONIN I
Troponin I: 0.06 ng/mL — ABNORMAL HIGH (ref <0.031)
Troponin I: 0.05 ng/mL — ABNORMAL HIGH (ref <0.031)
Troponin I: 0.05 ng/mL — ABNORMAL HIGH (ref <0.031)

## 2015-04-19 LAB — TSH: TSH: 1.839 u[IU]/mL (ref 0.350–4.500)

## 2015-04-19 LAB — HEPATITIS B SURFACE ANTIGEN: Hepatitis B Surface Ag: NEGATIVE

## 2015-04-19 LAB — MRSA PCR SCREENING: MRSA by PCR: POSITIVE — AB

## 2015-04-19 MED ORDER — SODIUM BICARBONATE 8.4 % IV SOLN
50.0000 meq | Freq: Once | INTRAVENOUS | Status: DC
  Filled 2015-04-19: qty 50

## 2015-04-19 MED ORDER — NITROGLYCERIN IN D5W 200-5 MCG/ML-% IV SOLN: 0.0000 ug/min | INTRAVENOUS | Status: DC

## 2015-04-19 MED ORDER — HEPARIN SODIUM (PORCINE) 1000 UNIT/ML DIALYSIS
8000.0000 [IU] | Freq: Once | INTRAMUSCULAR | Status: AC
  Administered 2015-04-19: 8000 [IU] via INTRAVENOUS_CENTRAL

## 2015-04-19 MED ORDER — PRO-STAT SUGAR FREE PO LIQD
30.0000 mL | Freq: Three times a day (TID) | ORAL | Status: DC
  Administered 2015-04-19 – 2015-04-21 (×8): 30 mL via ORAL
  Filled 2015-04-19 (×10): qty 30

## 2015-04-19 MED ORDER — SODIUM CHLORIDE 0.9 % IJ SOLN: 3.0000 mL | INTRAMUSCULAR | Status: DC | PRN

## 2015-04-19 MED ORDER — NEPRO/CARBSTEADY PO LIQD
237.0000 mL | ORAL | Status: DC | PRN
  Filled 2015-04-19: qty 237

## 2015-04-19 MED ORDER — INSULIN ASPART 100 UNIT/ML ~~LOC~~ SOLN: 0.0000 [IU] | Freq: Every day | SUBCUTANEOUS | Status: DC

## 2015-04-19 MED ORDER — METOPROLOL SUCCINATE ER 50 MG PO TB24
50.0000 mg | ORAL_TABLET | Freq: Every day | ORAL | Status: DC
  Administered 2015-04-19 – 2015-04-21 (×3): 50 mg via ORAL
  Filled 2015-04-19 (×3): qty 1

## 2015-04-19 MED ORDER — NITROGLYCERIN 0.4 MG SL SUBL: 0.4000 mg | SUBLINGUAL_TABLET | SUBLINGUAL | Status: DC | PRN

## 2015-04-19 MED ORDER — SODIUM CHLORIDE 0.9 % IV SOLN: 250.0000 mL | INTRAVENOUS | Status: DC | PRN

## 2015-04-19 MED ORDER — INSULIN ASPART 100 UNIT/ML ~~LOC~~ SOLN
0.0000 [IU] | Freq: Three times a day (TID) | SUBCUTANEOUS | Status: DC
  Administered 2015-04-19 – 2015-04-21 (×2): 1 [IU] via SUBCUTANEOUS

## 2015-04-19 MED ORDER — SODIUM CHLORIDE 0.9 % IJ SOLN
3.0000 mL | Freq: Two times a day (BID) | INTRAMUSCULAR | Status: DC
  Administered 2015-04-19 – 2015-04-21 (×4): 3 mL via INTRAVENOUS

## 2015-04-19 MED ORDER — ALBUTEROL SULFATE (2.5 MG/3ML) 0.083% IN NEBU: 2.5000 mg | INHALATION_SOLUTION | Freq: Four times a day (QID) | RESPIRATORY_TRACT | Status: DC | PRN

## 2015-04-19 MED ORDER — PERFLUTREN LIPID MICROSPHERE
INTRAVENOUS | Status: AC
  Administered 2015-04-19: 2 mL via INTRAVENOUS
  Filled 2015-04-19: qty 10

## 2015-04-19 MED ORDER — LIDOCAINE-PRILOCAINE 2.5-2.5 % EX CREA: 1.0000 "application " | TOPICAL_CREAM | CUTANEOUS | Status: DC | PRN

## 2015-04-19 MED ORDER — CETYLPYRIDINIUM CHLORIDE 0.05 % MT LIQD
7.0000 mL | Freq: Two times a day (BID) | OROMUCOSAL | Status: DC
  Administered 2015-04-19 – 2015-04-21 (×5): 7 mL via OROMUCOSAL

## 2015-04-19 MED ORDER — CHLORHEXIDINE GLUCONATE CLOTH 2 % EX PADS
6.0000 | MEDICATED_PAD | Freq: Every day | CUTANEOUS | Status: DC
  Administered 2015-04-19 – 2015-04-21 (×3): 6 via TOPICAL

## 2015-04-19 MED ORDER — ATORVASTATIN CALCIUM 10 MG PO TABS
10.0000 mg | ORAL_TABLET | Freq: Every day | ORAL | Status: DC
  Administered 2015-04-19 – 2015-04-21 (×3): 10 mg via ORAL
  Filled 2015-04-19 (×3): qty 1

## 2015-04-19 MED ORDER — SODIUM CHLORIDE 0.9 % IV SOLN: 100.0000 mL | INTRAVENOUS | Status: DC | PRN

## 2015-04-19 MED ORDER — ALBUTEROL SULFATE (2.5 MG/3ML) 0.083% IN NEBU
2.5000 mg | INHALATION_SOLUTION | Freq: Four times a day (QID) | RESPIRATORY_TRACT | Status: DC
  Administered 2015-04-19 (×2): 2.5 mg via RESPIRATORY_TRACT
  Filled 2015-04-19 (×2): qty 3

## 2015-04-19 MED ORDER — HEPARIN SODIUM (PORCINE) 5000 UNIT/ML IJ SOLN
5000.0000 [IU] | Freq: Three times a day (TID) | INTRAMUSCULAR | Status: DC
  Administered 2015-04-19 – 2015-04-21 (×8): 5000 [IU] via SUBCUTANEOUS
  Filled 2015-04-19 (×10): qty 1

## 2015-04-19 MED ORDER — CLOPIDOGREL BISULFATE 75 MG PO TABS
75.0000 mg | ORAL_TABLET | Freq: Every day | ORAL | Status: DC
  Administered 2015-04-19 – 2015-04-21 (×3): 75 mg via ORAL
  Filled 2015-04-19 (×3): qty 1

## 2015-04-19 MED ORDER — ASPIRIN 81 MG PO CHEW
81.0000 mg | CHEWABLE_TABLET | Freq: Every day | ORAL | Status: DC
  Administered 2015-04-19 – 2015-04-21 (×3): 81 mg via ORAL
  Filled 2015-04-19 (×3): qty 1

## 2015-04-19 MED ORDER — LIDOCAINE HCL (PF) 1 % IJ SOLN: 5.0000 mL | INTRAMUSCULAR | Status: DC | PRN

## 2015-04-19 MED ORDER — LISINOPRIL 2.5 MG PO TABS
2.5000 mg | ORAL_TABLET | Freq: Every day | ORAL | Status: DC
  Filled 2015-04-19: qty 1

## 2015-04-19 MED ORDER — AMIODARONE HCL 200 MG PO TABS
200.0000 mg | ORAL_TABLET | Freq: Every day | ORAL | Status: DC
Start: 2015-04-19 — End: 2015-04-21
  Administered 2015-04-19 – 2015-04-21 (×3): 200 mg via ORAL
  Filled 2015-04-19 (×3): qty 1

## 2015-04-19 MED ORDER — MUPIROCIN 2 % EX OINT
1.0000 "application " | TOPICAL_OINTMENT | Freq: Two times a day (BID) | CUTANEOUS | Status: DC
  Administered 2015-04-19 – 2015-04-21 (×5): 1 via NASAL
  Filled 2015-04-19: qty 22

## 2015-04-19 MED ORDER — HYDROCODONE-ACETAMINOPHEN 5-325 MG PO TABS: 1.0000 | ORAL_TABLET | Freq: Four times a day (QID) | ORAL | Status: DC | PRN

## 2015-04-19 MED ORDER — ALTEPLASE 2 MG IJ SOLR
2.0000 mg | Freq: Once | INTRAMUSCULAR | Status: AC | PRN
  Filled 2015-04-19: qty 2

## 2015-04-19 MED ORDER — DSS 100 MG PO CAPS: 100.0000 mg | ORAL_CAPSULE | Freq: Two times a day (BID) | ORAL | Status: DC

## 2015-04-19 MED ORDER — SODIUM POLYSTYRENE SULFONATE 15 GM/60ML PO SUSP: 30.0000 g | Freq: Once | ORAL | Status: DC

## 2015-04-19 MED ORDER — GUAIFENESIN ER 600 MG PO TB12
600.0000 mg | ORAL_TABLET | Freq: Two times a day (BID) | ORAL | Status: DC
  Administered 2015-04-19 – 2015-04-21 (×5): 600 mg via ORAL
  Filled 2015-04-19 (×6): qty 1

## 2015-04-19 MED ORDER — SODIUM CHLORIDE 0.9 % IJ SOLN
3.0000 mL | Freq: Two times a day (BID) | INTRAMUSCULAR | Status: DC
  Administered 2015-04-19 – 2015-04-21 (×5): 3 mL via INTRAVENOUS

## 2015-04-19 MED ORDER — LACTULOSE 10 GM/15ML PO SOLN
20.0000 g | Freq: Two times a day (BID) | ORAL | Status: DC | PRN
  Filled 2015-04-19: qty 30

## 2015-04-19 MED ORDER — CALCIUM ACETATE 667 MG PO CAPS
667.0000 mg | ORAL_CAPSULE | Freq: Three times a day (TID) | ORAL | Status: DC
  Administered 2015-04-19 – 2015-04-21 (×8): 667 mg via ORAL
  Filled 2015-04-19 (×10): qty 1

## 2015-04-19 MED ORDER — SODIUM CHLORIDE 0.9 % IV SOLN
1.0000 g | Freq: Once | INTRAVENOUS | Status: DC
  Filled 2015-04-19 (×2): qty 10

## 2015-04-19 MED ORDER — PERFLUTREN LIPID MICROSPHERE
1.0000 mL | INTRAVENOUS | Status: AC | PRN
Start: 2015-04-19 — End: 2015-04-19
  Administered 2015-04-19: 2 mL via INTRAVENOUS
  Filled 2015-04-19: qty 10

## 2015-04-19 MED ORDER — HEPARIN SODIUM (PORCINE) 1000 UNIT/ML DIALYSIS
1000.0000 [IU] | INTRAMUSCULAR | Status: DC | PRN
  Filled 2015-04-19: qty 1

## 2015-04-19 MED ORDER — PENTAFLUOROPROP-TETRAFLUOROETH EX AERO: 1.0000 "application " | INHALATION_SPRAY | CUTANEOUS | Status: DC | PRN

## 2015-04-19 NOTE — Progress Notes (Signed)
Echocardiogram 2D Echocardiogram has been performed.  Joelene Millin 04/19/2015, 3:08 PM

## 2015-04-19 NOTE — H&P (Signed)
MARIBELLA DITTON is an 59 y.o. female.    [facility])  Chief Complaint: dyspnea HPI: 59 yo female with Dm2,  ESRD on HD M, W, F, Cardiomyopathy (EF 30%), apparently c/o dyspnea per reports. Pt is deaf as well as blind, and is a poor historian.  Pt was brought to ED and noted to have hyperkalemia  7.3  And also probable CHF.  Nephrology has been consulted and also cardiology.  Pt will be admitted with CHF, hyperkalemia.   Past Medical History  Diagnosis Date  . Hyperlipidemia   . Hypertension     a. Has previously refused blood pressure meds.   . Deaf     Divorced from husband but lives with him. Has daughter but she does not care for her.  . Bilateral leg edema     a. Chronic.  Marland Kitchen Psychiatric disorder     She frequently exhibits paranoia and has been diagnosed with psychotic d/o NOS during hospital stay in the past. She is tangetial and perseverative during her visits. She apparently has had a bad experience with mental health in [place] in the past and refuses to discuss mental issues for fear that she will be sent back there. Her paranoia, communication issues, financial woes and lack of fam  . Fibroid uterus   . Anemia     Due to fibroids.  . Heart murmur   . Diabetes mellitus     a. Per PCP note 2012 (A1C 9.2) - pt unwilling to take meds and was educated on risk of uncontrolled DM.  b. A1C 5.9 in 11/2013.  Marland Kitchen Coronary artery disease   . Blind   . ESRD on hemodialysis     Mon, Wed, Fri  . CHF (congestive heart failure)   . MI (myocardial infarction) 11/2014    Past Surgical History  Procedure Laterality Date  . No past surgeries    . Insertion of dialysis catheter Right 01/02/2014    Procedure: INSERTION OF DIALYSIS CATHETER;  Surgeon: Rosetta Posner, MD;  Location: [place];  Service: Vascular;  Laterality: Right;  . Av fistula placement Left 01/02/2014    Procedure: ARTERIOVENOUS (AV) FISTULA CREATION;  Surgeon: Rosetta Posner, MD;  Location: [place];  Service: Vascular;   Laterality: Left;  . Amputation Bilateral 04/02/2015    Procedure: Amputation Bilateral Great Toes MTP Joint;  Surgeon: Newt Minion, MD;  Location: [place];  Service: Orthopedics;  Laterality: Bilateral;    Family History  Problem Relation Age of Onset  . Other Father     Drowned from fishing?  . Heart disease     Social History:  reports that she has never smoked. She does not have any smokeless tobacco history on file. She reports that she does not drink alcohol or use illicit drugs.  Allergies: No Known Allergies Medications reviewed  (Not in a hospital admission)  Results for orders placed or performed during the hospital encounter of 04/18/15 (from the past 48 hour(s))  CBC with Differential     Status: Abnormal   Collection Time: 04/18/15 10:55 PM  Result Value Ref Range   WBC 14.4 (H) 4.0 - 10.5 K/uL   RBC 3.71 (L) 3.87 - 5.11 MIL/uL   Hemoglobin 11.5 (L) 12.0 - 15.0 g/dL   HCT 35.4 (L) 36.0 - 46.0 %   MCV 95.4 78.0 - 100.0 fL   MCH 31.0 26.0 - 34.0 pg   MCHC 32.5 30.0 - 36.0 g/dL   RDW  16.6 (H) 11.5 - 15.5 %   Platelets 409 (H) 150 - 400 K/uL   Neutrophils Relative % 85 (H) 43 - 77 %   Neutro Abs 12.1 (H) 1.7 - 7.7 K/uL   Lymphocytes Relative 8 (L) 12 - 46 %   Lymphs Abs 1.2 0.7 - 4.0 K/uL   Monocytes Relative 6 3 - 12 %   Monocytes Absolute 0.9 0.1 - 1.0 K/uL   Eosinophils Relative 1 0 - 5 %   Eosinophils Absolute 0.2 0.0 - 0.7 K/uL   Basophils Relative 0 0 - 1 %   Basophils Absolute 0.1 0.0 - 0.1 K/uL  Brain natriuretic peptide     Status: Abnormal   Collection Time: 04/18/15 10:55 PM  Result Value Ref Range   B Natriuretic Peptide 2821.7 (H) 0.0 - 100.0 pg/mL  I-Stat Troponin, ED (not at [facility])     Status: None   Collection Time: 04/18/15 11:10 PM  Result Value Ref Range   Troponin i, poc 0.04 0.00 - 0.08 ng/mL   Comment 3            Comment: Due to the release kinetics of cTnI, a negative result within the first hours of the onset of symptoms does not rule  out myocardial infarction with certainty. If myocardial infarction is still suspected, repeat the test at appropriate intervals.   I-Stat Chem 8, ED     Status: Abnormal   Collection Time: 04/18/15 11:11 PM  Result Value Ref Range   Sodium 123 (L) 135 - 145 mmol/L   Potassium 7.3 (HH) 3.5 - 5.1 mmol/L   Chloride 90 (L) 96 - 112 mmol/L   BUN 89 (H) 6 - 23 mg/dL   Creatinine, Ser 6.50 (H) 0.50 - 1.10 mg/dL   Glucose, Bld 216 (H) 70 - 99 mg/dL   Calcium, Ion 1.00 (L) 1.12 - 1.23 mmol/L   TCO2 24 0 - 100 mmol/L   Hemoglobin 14.3 12.0 - 15.0 g/dL   HCT 42.0 36.0 - 46.0 %   Comment NOTIFIED PHYSICIAN    No results found.  Review of Systems  Constitutional: Negative.   Eyes: Negative.   Respiratory: Positive for shortness of breath. Negative for cough, hemoptysis, sputum production and wheezing.   Cardiovascular: Negative.   Gastrointestinal: Negative.   Genitourinary: Negative.   Musculoskeletal: Negative.   Skin: Negative.   Neurological: Negative.   Endo/Heme/Allergies: Negative.   Psychiatric/Behavioral: Negative.     Blood pressure 143/76, pulse 80, resp. rate 14, SpO2 100 %. Physical Exam  Constitutional: She is oriented to person, place, and time. She appears well-developed and well-nourished.  HENT:  Head: Normocephalic and atraumatic.  Mouth/Throat: No oropharyngeal exudate.  Eyes: Conjunctivae and EOM are normal. Pupils are equal, round, and reactive to light. No scleral icterus.  Neck: Normal range of motion. Neck supple. JVD present. No tracheal deviation present. No thyromegaly present.  Cardiovascular: Normal rate and regular rhythm.  Exam reveals no gallop and no friction rub.   No murmur heard. Respiratory: No respiratory distress. She has no wheezes. She has rales. She exhibits no tenderness.  GI: Soft. Bowel sounds are normal. She exhibits no distension. There is no tenderness. There is no rebound and no guarding.  Musculoskeletal: Normal range of motion.  She exhibits no edema or tenderness.  Lymphadenopathy:    She has no cervical adenopathy.  Neurological: She is alert and oriented to person, place, and time. She has normal reflexes. She displays normal reflexes. No cranial nerve  deficit. She exhibits normal muscle tone. Coordination normal.  Skin: Skin is warm and dry. No rash noted. No erythema. No pallor.  Psychiatric: She has a normal mood and affect. Her behavior is normal. Judgment and thought content normal.     Assessment/Plan Hyperkalemia Pt will be dialyzed tonight.   ESRD on HD M, W, F Nephrology has been consulted, appreciate input  CHF (EF 30%), possibly due to htn Dialysis Appreciate cardiology input Trop i q6h x3, TSH Check cardiac echo  Hypertension uncontrolled Cont nitro drip.   Dm2 fsbs ac and qhs, iss  DVT prophylaxis: scd   Jani Gravel 04/19/2015, 12:17 AM

## 2015-04-19 NOTE — Progress Notes (Signed)
Patient Name: [name] Date of Encounter: 04/19/2015     Active Problems:   Hyperkalemia   CHF (congestive heart failure)   ESRD (end stage renal disease)    SUBJECTIVE  The patient appears to be in no acute distress this morning.  She was interviewed and examined through an interpreter.  The patient is both blind and deaf.  Her blindness is of recent onset.  She denies any chest pain this morning.  She does not appear to be acutely dyspneic.  Her heart rhythm is sinus tachycardia.  Her past history is complex and includes hypertension, dyslipidemia, T2DM, ESRD on hemodialysis Monday/Wednesday/Friday, large missed anterior MI (diagnosed 11/20/14) c/b ischemic cardiomyopathy (EF 30-35%, dyskinesis and aneurysm of anterior wall), VT arrest without ICD implantation due to poor prognosis. This time she presented with dyspnea felt most consistent with flash pulmonary edema in the setting of hypertensive emergency.  She was also hyperkalemic.  She required urgent dialysis for volume overload and hyperkalemia.  Her lisinopril is on hold because of the hyperkalemia.  CURRENT MEDS . albuterol  2.5 mg Nebulization Q6H  . amiodarone  200 mg Oral Daily  . aspirin  81 mg Oral Daily  . aspirin EC  325 mg Oral Once  . atorvastatin  10 mg Oral q1800  . calcium acetate  667 mg Oral TID WC  . calcium gluconate  1 g Intravenous Once  . Chlorhexidine Gluconate Cloth  6 each Topical Q0600  . clopidogrel  75 mg Oral Daily  . feeding supplement (PRO-STAT SUGAR FREE 64)  30 mL Oral TID WC  . guaiFENesin  600 mg Oral BID  . heparin  5,000 Units Subcutaneous 3 times per day  . insulin aspart  0-5 Units Subcutaneous QHS  . insulin aspart  0-9 Units Subcutaneous TID WC  . metoprolol succinate  50 mg Oral Daily  . mupirocin ointment  1 application Nasal BID  . sodium bicarbonate  50 mEq Intravenous Once  . sodium chloride  3 mL Intravenous Q12H  . sodium chloride  3 mL Intravenous Q12H  . sodium  polystyrene  30 g Oral Once    OBJECTIVE  Filed Vitals:   04/19/15 0745 04/19/15 0748 04/19/15 0830 04/19/15 0925  BP: 133/54  145/54 163/126  Pulse: 84 84 86 95  Temp: 98.2 F (36.8 C)     TempSrc: Oral     Resp: 15 14 21 15   SpO2: 100% 100% 100% 100%    Intake/Output Summary (Last 24 hours) at 04/19/15 [id number] Last data filed at 04/19/15 [id number]  Gross per 24 hour  Intake    156 ml  Output   6001 ml  Net  -5845 ml   Filed Weights    PHYSICAL EXAM  General: The patient is blind and deaf. HEENT:  Normal  Neck: Supple without bruits.  Jugular venous pressure is elevated Lungs:  Resp regular and unlabored, there are a few rales at the right base Heart: RRR no s3, s4, or murmurs. Abdomen: Soft, non-tender, non-distended, BS + x 4.  Extremities: There is a dressing over wound of right foot she had amputation of the great toes bilaterally on 04/02/15.  Left upper extremity fistula with thrill  Accessory Clinical Findings  CBC  Recent Labs  04/18/15 2255 04/18/15 2311  WBC 14.4*  --   NEUTROABS 12.1*  --   HGB 11.5* 14.3  HCT 35.4* 42.0  MCV 95.4  --   PLT 409*  --  Basic Metabolic Panel  Recent Labs  04/18/15 2256 04/18/15 2311  NA 122* 123*  K 7.2* 7.3*  CL 84* 90*  CO2 23  --   GLUCOSE 201* 216*  BUN 60* 89*  CREATININE 6.78* 6.50*  CALCIUM 8.5  --    Liver Function Tests  Recent Labs  04/18/15 2256  AST 108*  ALT 69*  ALKPHOS 200*  BILITOT 1.5*  PROT 8.7*  ALBUMIN 3.3*   No results for input(s): LIPASE, AMYLASE in the last 72 hours. Cardiac Enzymes No results for input(s): CKTOTAL, CKMB, CKMBINDEX, TROPONINI in the last 72 hours. BNP Invalid input(s): POCBNP D-Dimer No results for input(s): DDIMER in the last 72 hours. Hemoglobin A1C No results for input(s): HGBA1C in the last 72 hours. Fasting Lipid Panel No results for input(s): CHOL, HDL, LDLCALC, TRIG, CHOLHDL, LDLDIRECT in the last 72 hours. Thyroid Function Tests No results for  input(s): TSH, T4TOTAL, T3FREE, THYROIDAB in the last 72 hours.  Invalid input(s): FREET3  TELE  Sinus tachycardia  ECG on 04/18/15  Age not entered, assumed to be 59 years old for purpose of ECG interpretation Sinus or ectopic atrial rhythm Borderline low voltage, extremity leads Posterior infarct, acute (LCx) Lateral infarct, acute Personally reviewed.  The tracing is markedly different from previous tracings done in January 2016.  I will repeat her EKG to be sure that lead placement was not playing a role. Radiology/Studies  Dg Chest Portable 1 View  04/19/2015   CLINICAL DATA:  Code ST-elevation myocardial infarction. Chest pain and shortness of breath. Initial encounter.  EXAM: PORTABLE CHEST - 1 VIEW  COMPARISON:  Chest radiograph performed 01/14/2015  FINDINGS: The lungs are well-aerated. Vascular congestion is noted, with mildly increased interstitial markings, raising concern for mild pulmonary edema. A small left pleural effusion is suspected. No pneumothorax is seen.  The cardiomediastinal silhouette is mildly enlarged. No acute osseous abnormalities are seen.  IMPRESSION: Vascular congestion and mild cardiomegaly, with mildly increased interstitial markings, raising concern for mild pulmonary edema. Suspect small left pleural effusion.   Electronically Signed   By: Garald Balding M.D.   On: 04/19/2015 00:17    ASSESSMENT AND PLAN 1.  Hypertensive emergency with volume overload 2.  Ischemic with previous missed anterior wall myocardial infarction and ejection fraction 30-35%. 3.  End-stage renal disease with hyperkalemia 4.  Blindness and deafness 5.  Past history of VT arrest without ICD implantation due to poor prognosis.  On amiodarone 6.  Diabetes mellitus.  Recent bilateral great toe amputations on 04/02/15.  Plan: Continue aspirin, Plavix, metoprolol, and amiodarone.  Lisinopril is on hold because of hyperkalemia I will recheck EKG today. Will follow  Signed, Darlin Coco MD

## 2015-04-19 NOTE — ED Provider Notes (Signed)
CSN: [id number]     Arrival date & time 04/18/15  2228 History   First MD Initiated Contact with Patient 04/18/15 2235     Chief Complaint  Patient presents with  . Chest Pain  . Shortness of Breath     (Consider location/radiation/quality/duration/timing/severity/associated sxs/prior Treatment) HPI   Following history is limited due to the fact that the patient is deaf, blind:  This is a 59 year old female, with a history of diabetes, end-stage renal disease, on hemodialysis Monday, Wednesday, Friday, cardiomyopathy (EF 30%), presenting today by paramedics after they were called out to her facility due to dyspnea, chest pain. Time of onset one hour before presentation. Unable to localize pain. It apparently has resolved. Patient was called in as a code STEMI, required CPAP starting at facility. Further history is unable to be obtained at this time due to inability to communicate with the patient.  Past Medical History  Diagnosis Date  . Hyperlipidemia   . Hypertension     a. Has previously refused blood pressure meds.   . Deaf     Divorced from husband but lives with him. Has daughter but she does not care for her.  . Bilateral leg edema     a. Chronic.  Marland Kitchen Psychiatric disorder     She frequently exhibits paranoia and has been diagnosed with psychotic d/o NOS during hospital stay in the past. She is tangetial and perseverative during her visits. She apparently has had a bad experience with mental health in [place] in the past and refuses to discuss mental issues for fear that she will be sent back there. Her paranoia, communication issues, financial woes and lack of fam  . Fibroid uterus   . Anemia     Due to fibroids.  . Heart murmur   . Diabetes mellitus     a. Per PCP note 2012 (A1C 9.2) - pt unwilling to take meds and was educated on risk of uncontrolled DM.  b. A1C 5.9 in 11/2013.  Marland Kitchen Coronary artery disease   . Blind   . ESRD on hemodialysis     Mon, Wed, Fri  . CHF (congestive  heart failure)   . MI (myocardial infarction) 11/2014   Past Surgical History  Procedure Laterality Date  . No past surgeries    . Insertion of dialysis catheter Right 01/02/2014    Procedure: INSERTION OF DIALYSIS CATHETER;  Surgeon: Rosetta Posner, MD;  Location: [facility];  Service: Vascular;  Laterality: Right;  . Av fistula placement Left 01/02/2014    Procedure: ARTERIOVENOUS (AV) FISTULA CREATION;  Surgeon: Rosetta Posner, MD;  Location: [facility];  Service: Vascular;  Laterality: Left;  . Amputation Bilateral 04/02/2015    Procedure: Amputation Bilateral Great Toes MTP Joint;  Surgeon: Newt Minion, MD;  Location: [facility];  Service: Orthopedics;  Laterality: Bilateral;   Family History  Problem Relation Age of Onset  . Other Father     Drowned from fishing?  . Heart disease     History  Substance Use Topics  . Smoking status: Never Smoker   . Smokeless tobacco: Not on file     Comment: Prior dip  . Alcohol Use: No   OB History    Gravida Para Term Preterm AB TAB SAB Ectopic Multiple Living   1 1        1      Review of Systems  Unable to perform ROS: Patient nonverbal      Allergies  Review of  patient's allergies indicates no known allergies.  Home Medications   Prior to Admission medications   Medication Sig Start Date End Date Taking? Authorizing Provider  amiodarone (PACERONE) 200 MG tablet Take 200 mg by mouth daily.   Yes Historical Provider, MD  aspirin 81 MG tablet Take 81 mg by mouth daily. For hypertension   Yes Historical Provider, MD  atorvastatin (LIPITOR) 10 MG tablet Take 10 mg by mouth daily.   Yes Historical Provider, MD  calcium acetate (PHOSLO) 667 MG capsule Take 1 capsule (667 mg total) by mouth 3 (three) times daily with meals. 01/08/14  Yes Kelvin Cellar, MD  clopidogrel (PLAVIX) 75 MG tablet Take 1 tablet (75 mg total) by mouth daily. 11/26/14  Yes Ripudeep Krystal Eaton, MD  Docusate Sodium (DSS) 100 MG CAPS Take 100 mg by mouth 2 (two) times daily. For  constipation 01/08/14  Yes Kelvin Cellar, MD  guaiFENesin (MUCINEX) 600 MG 12 hr tablet Take 1 tablet (600 mg total) by mouth 2 (two) times daily. 01/16/15  Yes Samella Parr, NP  lisinopril (PRINIVIL,ZESTRIL) 2.5 MG tablet Take 1 tablet (2.5 mg total) by mouth daily. 11/26/14  Yes Ripudeep Krystal Eaton, MD  metoprolol succinate (TOPROL-XL) 50 MG 24 hr tablet Take 1 tablet (50 mg total) by mouth daily. Take with or immediately following a meal. 11/26/14  Yes Ripudeep Krystal Eaton, MD  Multiple Vitamin (MULTIVITAMIN WITH MINERALS) TABS tablet Take 2 tablets by mouth daily.   Yes Historical Provider, MD  acetaminophen (TYLENOL) 325 MG tablet Take 650 mg by mouth 3 (three) times daily as needed.    Historical Provider, MD  Amino Acids-Protein Hydrolys (FEEDING SUPPLEMENT, PRO-STAT SUGAR FREE 64,) LIQD Take 30 mLs by mouth 3 (three) times daily with meals. Patient not taking: Reported on 04/18/2015 12/21/14   Delfina Redwood, MD  HYDROcodone-acetaminophen (NORCO) 5-325 MG per tablet Take 1 tablet by mouth every 6 (six) hours as needed. 04/02/15   Newt Minion, MD  lactulose (CHRONULAC) 10 GM/15ML solution Take 30 mLs (20 g total) by mouth 2 (two) times daily as needed for moderate constipation. 01/16/15   Samella Parr, NP  levalbuterol Penne Lash) 0.63 MG/3ML nebulizer solution Take 3 mLs (0.63 mg total) by nebulization every 2 (two) hours as needed for wheezing or shortness of breath. 01/16/15   Samella Parr, NP  nitroGLYCERIN (NITROSTAT) 0.4 MG SL tablet Place 1 tablet (0.4 mg total) under the tongue every 5 (five) minutes as needed for chest pain. 11/26/14   Ripudeep K Rai, MD   BP 143/70 mmHg  Pulse 80  Resp 14  SpO2 100% Physical Exam  Constitutional: She appears well-developed and well-nourished. No distress.  HENT:  Head: Normocephalic and atraumatic.  Mouth/Throat: No oropharyngeal exudate.  Eyes: Conjunctivae are normal. Pupils are equal, round, and reactive to light. No scleral icterus.  Neck:  Normal range of motion. No tracheal deviation present. No thyromegaly present.  Cardiovascular: Normal rate and regular rhythm.   Pulmonary/Chest: Effort normal. No stridor. No respiratory distress. She has wheezes. She has rales. She exhibits no tenderness.  Abdominal: Soft. She exhibits no distension and no mass. There is no tenderness. There is no rebound and no guarding.  Musculoskeletal: Normal range of motion. She exhibits edema (BLE).  Neurological: She is alert.  Skin: Skin is warm and dry. She is not diaphoretic.    ED Course  Procedures (including critical care time) Labs Review Labs Reviewed  CBC WITH DIFFERENTIAL/PLATELET - Abnormal; Notable for the  following:    WBC 14.4 (*)    RBC 3.71 (*)    Hemoglobin 11.5 (*)    HCT 35.4 (*)    RDW 16.6 (*)    Platelets 409 (*)    Neutrophils Relative % 85 (*)    Neutro Abs 12.1 (*)    Lymphocytes Relative 8 (*)    All other components within normal limits  BRAIN NATRIURETIC PEPTIDE - Abnormal; Notable for the following:    B Natriuretic Peptide 2821.7 (*)    All other components within normal limits  COMPREHENSIVE METABOLIC PANEL - Abnormal; Notable for the following:    Sodium 122 (*)    Potassium 7.2 (*)    Chloride 84 (*)    Glucose, Bld 201 (*)    BUN 60 (*)    Creatinine, Ser 6.78 (*)    Total Protein 8.7 (*)    Albumin 3.3 (*)    AST 108 (*)    ALT 69 (*)    Alkaline Phosphatase 200 (*)    Total Bilirubin 1.5 (*)    GFR calc non Af Amer 6 (*)    GFR calc Af Amer 7 (*)    All other components within normal limits  I-STAT CHEM 8, ED - Abnormal; Notable for the following:    Sodium 123 (*)    Potassium 7.3 (*)    Chloride 90 (*)    BUN 89 (*)    Creatinine, Ser 6.50 (*)    Glucose, Bld 216 (*)    Calcium, Ion 1.00 (*)    All other components within normal limits  I-STAT TROPOININ, ED    Imaging Review Dg Chest Portable 1 View  04/19/2015   CLINICAL DATA:  Code ST-elevation myocardial infarction. Chest pain  and shortness of breath. Initial encounter.  EXAM: PORTABLE CHEST - 1 VIEW  COMPARISON:  Chest radiograph performed 01/14/2015  FINDINGS: The lungs are well-aerated. Vascular congestion is noted, with mildly increased interstitial markings, raising concern for mild pulmonary edema. A small left pleural effusion is suspected. No pneumothorax is seen.  The cardiomediastinal silhouette is mildly enlarged. No acute osseous abnormalities are seen.  IMPRESSION: Vascular congestion and mild cardiomegaly, with mildly increased interstitial markings, raising concern for mild pulmonary edema. Suspect small left pleural effusion.   Electronically Signed   By: Garald Balding M.D.   On: 04/19/2015 00:17   MDM   Final diagnoses:  Hyperkalemia  Acute on chronic congestive heart failure, unspecified congestive heart failure type  ESRD (end stage renal disease)    Following history is limited due to the fact that the patient is deaf, blind:  This is a 59 year old female, with a history of diabetes, end-stage renal disease, on hemodialysis Monday, Wednesday, Friday, cardiomyopathy (EF 30%), presenting today by paramedics after they were called out to her facility due to dyspnea, chest pain. Time of onset one hour before presentation. Unable to localize pain. It apparently has resolved. Patient was called in as a code STEMI, required CPAP starting at facility. Further history is unable to be obtained at this time due to inability to communicate with the patient.  On arrival of paramedics to the scene, patient was acutely dyspneic. Patient has supposedly improved after CPAP treatment.  On examination, patient is mildly diaphoretic, dyspneic. She has diffuse wheezing, rales. She has 2+ pitting edema in the lower extremities bilaterally. She has JVD. EKG is unchanged from last presentation here. Troponin is within normal limits.  The patient is uremic, fluid overloaded,  hyperkalemic to above 7. Emergent dialysis  indicated. I consult the nephrology. Cardiology has evaluated the patient, does not believe the cath lab is indicated at this time. They will continue to follow the patient.  I have spoken with the hospitalist. Will admit to the stepdown unit after patient is dialyzed.  I have discussed case and care has been guided by my attending physician, Dr. Vanita Panda.  Doy Hutching, MD 04/19/15 0140  Carmin Muskrat, MD 04/22/15 [phone]

## 2015-04-19 NOTE — Progress Notes (Signed)
Patient admitted after midnight. Chart reviewed. Patient examined.  Appears comfortable lying flat in no respiratory distress.  Has had recent bilateral great toe amputations by Dr. Sharol Given. Wounds examined.  Has dried serosanguineous drainage on the left great toe wound. No surrounding cellulitis. No odor. The left great toe wound without drainage or cellulitis.  Will repeat BMET, blood pressure improved. Now off nitroglycerin. Echocardiogram pending. Will notify Dr. Sharol Given of patient's admission in case he wants to look at her wounds. Consult wound care. Patient has an out of facility DO NOT RESUSCITATE form in her outpatient chart and documentation in the ED confirms DO NOT RESUSCITATE status. Will change to DO NOT RESUSCITATE.  Much more stable. If repeat BMET improved, can move out of stepdown unit.  Doree Barthel, MD [facility]  [phone] [email or web address] password [facility]

## 2015-04-19 NOTE — Care Management Note (Signed)
    Page 1 of 1   04/19/2015     10:39:44 AM CARE MANAGEMENT NOTE 04/19/2015  Patient:  [name], [name]   Account Number:  [id number]  Date Initiated:  04/19/2015  Documentation initiated by:  Elissa Hefty  Subjective/Objective Assessment:   adm w htn urgency, hyperkalemia     Action/Plan:   from nsg facility   Anticipated DC Date:     Anticipated DC Plan:  [place] referral  Clinical Social Worker         Choice offered to / List presented to:             Status of service:   Medicare Important Message given?   (If response is "NO", the following Medicare IM given date fields will be blank) Date Medicare IM given:   Medicare IM given by:   Date Additional Medicare IM given:   Additional Medicare IM given by:    Discharge Disposition:    Per UR Regulation:  Reviewed for med. necessity/level of care/duration of stay  If discussed at [place] of Stay Meetings, dates discussed:    Comments:

## 2015-04-19 NOTE — ED Notes (Signed)
Attempted report- floor will not accept patient d/t titrating nitro

## 2015-04-19 NOTE — Progress Notes (Signed)
Patient ID: [name], female   DOB: 1956-10-15, 59 y.o.   MRN: [id number]  [facility] Progress Note   Assessment/ Plan:   1. Respiratory distress/pulmonary edema/hyperkalemia: Resolved with acute hemodialysis, medical management to be continued at this time with repeat EKG to ensure that this was not a false positive. 2.ESRD: On hemodialysis Monday/Wednesday/Friday schedule-status post dialysis earlier today with no acute needs at this time, will monitor for any dialysis needs tomorrow otherwise before until Wednesday. Left upper arm arterial venous fistula. 3. Anemia: Hemoglobin acceptable, monitor for ESA needs-redose Aranesp on Wednesday 4. CKD-MBD: Binders restarted, will reconcile medications for VDRA 5. Nutrition: Continue to monitor albumin levels-prior history of protein calorie malnutrition, continue ONS 6. Hypertension: Encourage compliance with her outpatient antihypertensive therapy, improved status post ultrafiltration with dialysis  Subjective:   No acute events overnight    Objective:   BP 115/43 mmHg  Pulse 82  Temp(Src) 97.9 F (36.6 C) (Oral)  Resp 15  Wt   SpO2 100%  Physical Exam: Gen: Resting comfortably in bed CVS: Pulse regular in rate and rhythm, S1 and S2 normal Resp: Clear to auscultation, no rales Abd: Soft, obese, nontender Ext: Trace lower extremity edema. Positive thrill left upper arm aVF  Labs: BMET  Recent Labs Lab 04/18/15 2256 04/18/15 2311 04/19/15 0931  NA 122* 123* 135  135  K 7.2* 7.3* 3.6  3.7  CL 84* 90* 94*  94*  CO2 23  --  28  28  GLUCOSE 201* 216* 108*  108*  BUN 60* 89* 22  22  CREATININE 6.78* 6.50* 3.75*  3.80*  CALCIUM 8.5  --  8.7  8.7   CBC  Recent Labs Lab 04/18/15 2255 04/18/15 2311 04/19/15 0931  WBC 14.4*  --  9.5  NEUTROABS 12.1*  --   --   HGB 11.5* 14.3 10.3*  HCT 35.4* 42.0 31.9*  MCV 95.4  --  94.4  PLT 409*  --  400   Medications:    . albuterol  2.5 mg Nebulization  Q6H  . amiodarone  200 mg Oral Daily  . antiseptic oral rinse  7 mL Mouth Rinse BID  . aspirin  81 mg Oral Daily  . aspirin EC  325 mg Oral Once  . atorvastatin  10 mg Oral q1800  . calcium acetate  667 mg Oral TID WC  . Chlorhexidine Gluconate Cloth  6 each Topical Q0600  . clopidogrel  75 mg Oral Daily  . feeding supplement (PRO-STAT SUGAR FREE 64)  30 mL Oral TID WC  . guaiFENesin  600 mg Oral BID  . heparin  5,000 Units Subcutaneous 3 times per day  . insulin aspart  0-5 Units Subcutaneous QHS  . insulin aspart  0-9 Units Subcutaneous TID WC  . metoprolol succinate  50 mg Oral Daily  . mupirocin ointment  1 application Nasal BID  . sodium chloride  3 mL Intravenous Q12H  . sodium chloride  3 mL Intravenous Q12H    Elmarie Shiley, MD 04/19/2015, 2:58 PM

## 2015-04-20 ENCOUNTER — Inpatient Hospital Stay (HOSPITAL_COMMUNITY): Payer: Medicare Other

## 2015-04-20 DIAGNOSIS — I10 Essential (primary) hypertension: Secondary | ICD-10-CM | POA: Diagnosis present

## 2015-04-20 DIAGNOSIS — I5189 Other ill-defined heart diseases: Secondary | ICD-10-CM | POA: Diagnosis present

## 2015-04-20 DIAGNOSIS — H540 Blindness, both eyes: Secondary | ICD-10-CM

## 2015-04-20 DIAGNOSIS — Z7382 Dual sensory impairment: Secondary | ICD-10-CM

## 2015-04-20 DIAGNOSIS — H9190 Unspecified hearing loss, unspecified ear: Secondary | ICD-10-CM

## 2015-04-20 DIAGNOSIS — H913 Deaf nonspeaking, not elsewhere classified: Secondary | ICD-10-CM

## 2015-04-20 DIAGNOSIS — I5041 Acute combined systolic (congestive) and diastolic (congestive) heart failure: Secondary | ICD-10-CM

## 2015-04-20 DIAGNOSIS — J9600 Acute respiratory failure, unspecified whether with hypoxia or hypercapnia: Secondary | ICD-10-CM | POA: Diagnosis present

## 2015-04-20 LAB — GLUCOSE, CAPILLARY
Glucose-Capillary: 106 mg/dL — ABNORMAL HIGH (ref 70–99)
Glucose-Capillary: 99 mg/dL (ref 70–99)
Glucose-Capillary: 100 mg/dL — ABNORMAL HIGH (ref 70–99)
Glucose-Capillary: 95 mg/dL (ref 70–99)
Glucose-Capillary: 110 mg/dL — ABNORMAL HIGH (ref 70–99)

## 2015-04-20 LAB — HEMOGLOBIN A1C
Hgb A1c MFr Bld: 5.4 % (ref 4.8–5.6)
Mean Plasma Glucose: 108 mg/dL

## 2015-04-20 NOTE — Progress Notes (Signed)
Subjective:  resting in room / hard to communicate /no interpreter in room but appears comfortable   Objective Vital signs in last 24 hours: Filed Vitals:   04/19/15 1640 04/19/15 1643 04/19/15 2303 04/20/15 0530  BP: 113/54 113/54 124/58 137/63  Pulse: 74 74 79 80  Temp: 98.5 F (36.9 C) 98.5 F (36.9 C) 98.4 F (36.9 C) 98.3 F (36.8 C)  TempSrc: Oral Oral Oral Oral  Resp: 14 16 16 18   Weight: 85.2 kg (187 lb 13.3 oz)   85.004 kg (187 lb 6.4 oz)  SpO2: 100% 100% 100% 99%   Weight change:   Physical Exam: General: in bed NAD, appears her baseline to [facility]  Heart: RRR Lungs: CTA bilat, unlabored breathing Abdomen: obese, soft ,NT, ND Extremities:Trace bipedal edema  Dialysis Access: pos bruit   L UA AVF  Problem/Plan:  1. Respiratory distress/pulmonary edema/hyperkalemia: Resolved with acute hemodialysis/ bipap, medical management to be continued at this time / 2d ef 30-35% and not signif changed from prior.. 2.ESRD: On hemodialysis Monday/Wednesday/Friday / Hyperkalemia on admit  yest  K 3.6/ dialysis tomorrow / Wednesday. Left upper arm arterial venous fistula. 3. Anemia: Hemoglobin 10.3 , Aranesp on Wednesday HD 4. CKD-MBD: Binders restarted,  Check with [facility] vit d dosing . 5. Nutrition:  Alb 3.1/-prior history of protein calorie malnutrition, continue ONS 6. Hypertension: Encourage compliance with her outpatient antihypertensive therapy, improved status post ultrafiltration with dialysis 7. Blind/ deaf/ mute  Ernest Haber, PA-C [facility] [phone] 04/20/2015,10:33 AM  LOS: 1 day   Labs: Basic Metabolic Panel:  Recent Labs Lab 04/18/15 2256 04/18/15 2311 04/19/15 0931  NA 122* 123* 135  135  K 7.2* 7.3* 3.6  3.7  CL 84* 90* 94*  94*  CO2 23  --  28  28  GLUCOSE 201* 216* 108*  108*  BUN 60* 89* 22  22  CREATININE 6.78* 6.50* 3.75*  3.80*  CALCIUM 8.5  --  8.7  8.7   Liver Function  Tests:  Recent Labs Lab 04/18/15 2256 04/19/15 0931  AST 108* 38*  ALT 69* 52*  ALKPHOS 200* 183*  BILITOT 1.5* 0.7  PROT 8.7* 7.8  ALBUMIN 3.3* 3.1*   CBC:  Recent Labs Lab 04/18/15 2255 04/18/15 2311 04/19/15 0931  WBC 14.4*  --  9.5  NEUTROABS 12.1*  --   --   HGB 11.5* 14.3 10.3*  HCT 35.4* 42.0 31.9*  MCV 95.4  --  94.4  PLT 409*  --  400   Cardiac Enzymes:  Recent Labs Lab 04/19/15 0931 04/19/15 1422 04/19/15 2030  TROPONINI 0.06* 0.05* 0.05*   CBG:  Recent Labs Lab 04/19/15 1112 04/19/15 1734 04/19/15 2253 04/20/15 0525 04/20/15 0804  GLUCAP 127* 107* 92 106* 99    Studies/Results: Dg Chest Portable 1 View  04/19/2015   CLINICAL DATA:  Code ST-elevation myocardial infarction. Chest pain and shortness of breath. Initial encounter.  EXAM: PORTABLE CHEST - 1 VIEW  COMPARISON:  Chest radiograph performed 01/14/2015  FINDINGS: The lungs are well-aerated. Vascular congestion is noted, with mildly increased interstitial markings, raising concern for mild pulmonary edema. A small left pleural effusion is suspected. No pneumothorax is seen.  The cardiomediastinal silhouette is mildly enlarged. No acute osseous abnormalities are seen.  IMPRESSION: Vascular congestion and mild cardiomegaly, with mildly increased interstitial markings, raising concern for mild pulmonary edema. Suspect small left pleural effusion.   Electronically Signed   By: Jacqulynn Cadet  Chang M.D.   On: 04/19/2015 00:17   Medications:   . amiodarone  200 mg Oral Daily  . antiseptic oral rinse  7 mL Mouth Rinse BID  . aspirin  81 mg Oral Daily  . aspirin EC  325 mg Oral Once  . atorvastatin  10 mg Oral q1800  . calcium acetate  667 mg Oral TID WC  . Chlorhexidine Gluconate Cloth  6 each Topical Q0600  . clopidogrel  75 mg Oral Daily  . feeding supplement (PRO-STAT SUGAR FREE 64)  30 mL Oral TID WC  . guaiFENesin  600 mg Oral BID  . heparin  5,000 Units Subcutaneous 3 times per day  . insulin  aspart  0-5 Units Subcutaneous QHS  . insulin aspart  0-9 Units Subcutaneous TID WC  . metoprolol succinate  50 mg Oral Daily  . mupirocin ointment  1 application Nasal BID  . sodium chloride  3 mL Intravenous Q12H  . sodium chloride  3 mL Intravenous Q12H

## 2015-04-20 NOTE — Clinical Social Work Note (Signed)
Clinical Social Work Assessment  Patient Details  Name: [name] MRN: [id number] Date of Birth: 1956/05/02  Date of referral:  04/20/15               Reason for consult:  Facility Placement                Permission sought to share information with:  Facility Sport and exercise psychologist  Housing/Transportation Living arrangements for the past 2 months:  [place] ([place]) Source of Information:  Adult Children Patient Interpreter Needed:  Sign Language (Pt deaf and blind) Criminal Activity/Legal Involvement Pertinent to Current Situation/Hospitalization:  No - Comment as needed Significant Relationships:  Adult Children, Spouse Lives with:  Facility Resident Need for family participation in patient care:  Yes (Comment)  Care giving concerns: Pt [name] term resident at [facility]. Family has concerns about facility. See below for more details.   Social Worker assessment / plan:  CSW spoke with pt son over the phone. Pt son reports that both pt and pt spouse (his parents) reside at [facility]. Pt husband was admitted to [facility]. Pt son stated he is feeling overwhelmed right now trying to make sure both of his parents are doing okay and receiving the care they deserve. Per pt, he feels that pt is not receiving correct meal coverage at facility. Pt son reports that facility staff are appropriate and attentive during first shift when administration is in the building, but he feels during 2nd shift and on weekends a lot of care is inadequate. CSW provided pt son with phone number for [facility] and explained Ombudsman role. Pt son thankful and stated he would follow up. Pt son also asked that CSW assist however possible. CSW to speak with facility representative and voice concerns on behalf of pt and pt family. CSW will also communicate with MD to ensure pt recommended diet is clearly communicated in dc summary.     Insurance information:  Medicare, Medicaid [place] [employer] / Referral to community resources:  [place]   Patient/Family's Response to care: CSW unable to assess pt at this time. CSW spoke with pt son. Pt son strongly advocating for pt to receive better care at facility. Very cooperative and accepting of CSW recommendations/resources.  Patient/Family's Understanding of and Emotional Response to Diagnosis, Current Treatment, and Prognosis:  Pt son with good understanding of pt condition. He asked appropriate questions and voiced concerns appropriately. Pt son calm through out assessment but express that he gets worked up/worried when he thinks about care pt and pt spouse are receiving at facility.  Emotional Assessment Psych involvement (Current and /or in the community):  No (Comment)   Discharge Needs  Concerns to be addressed:  Discharge Planning Concerns Readmission within the last 30 days:    Current discharge risk:  None Barriers to Discharge:  Continued Medical Work up   [employer], [place]

## 2015-04-20 NOTE — Progress Notes (Addendum)
[facility] PROGRESS NOTE  [name] [id number] DOB: [birth year] DOA: 04/18/2015 PCP: [name], DO  Summary 59 year old blind deaf mute black female from skilled nursing facility with a history of end-stage renal disease, previous MI, chronic acute and diastolic heart failure, hypertension who presented to the emergency room in respiratory failure, required BiPAP and had severely elevated blood pressures. She was also hyperkalemic. She was dialyzed urgently, and is now off BiPAP, potassium has normalized.  Initially, concerns of STEMI, but EKG changes likely related to electrolyte disturbances.  Assessment/Plan:  Principal Problem:   Acute combined systolic and diastolic heart failure:  Much improved with control of blood pressure and urgent dialysis.  She still has a few rales on exam, but is off oxygen completely. Will check chest x-ray to evaluate. Patient examined with interpreter today and seems to be communicating some residual shortness of breath. 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   Hx of Sustained VT- not an ICD candidate   Blindness with deafness   Hyperkalemia:  Resolved with dialysis   ESRD (end stage renal disease)   Accelerated hypertension:  Improved. Unclear what led to her initial presentation. She is from skilled nursing facility so I assume has been taking her medications.   Diastolic dysfunction-grade 2   Acute respiratory failure:  Initially requiring BiPAP. Improved Disposition: From skilled nursing facility. It is unclear to me how ambulatory she has. Will get a PT eval and trying get patient out of bed.   Code Status:  DO NOT RESUSCITATE: As per out of facility DO NOT RESUSCITATE form that came with the patient Family Communication:   Disposition Plan:  Back to skilled nursing facility  in a few days if she improves.  Consultants:  [facility]  Nephrology  Procedures:     Antibiotics:    HPI/Subjective: Via interpreter: Overall, feeling better, but still some shortness of breath. Denies any pain.  Objective: Filed Vitals:   04/20/15 0530  BP: 137/63  Pulse: 80  Temp: 98.3 F (36.8 C)  Resp: 18    Intake/Output Summary (Last 24 hours) at 04/20/15 0949 Last data filed at 04/19/15 2305  Gross per 24 hour  Intake    960 ml  Output     50 ml  Net    910 ml   Filed Weights   04/19/15 1640 04/20/15 0530  Weight: 85.2 kg (187 lb 13.3 oz) 85.004 kg (187 lb 6.4 oz)    Exam:   General:  Comfortable. Appears animated when communicating with interpreter, smiling.  Cardiovascular: Regular rate rhythm without murmurs gallops rubs  Respiratory: Rales at bases left greater than right  Abdomen: Soft nontender nondistended  Ext: No pitting edema. No clubbing or cyanosis.  Basic Metabolic Panel:  Recent Labs Lab 04/18/15 2256 04/18/15 2311 04/19/15 0931  NA 122* 123* 135  135  K 7.2* 7.3* 3.6  3.7  CL 84* 90* 94*  94*  CO2 23  --  28  28  GLUCOSE 201* 216* 108*  108*  BUN 60* 89* 22  22  CREATININE 6.78* 6.50* 3.75*  3.80*  CALCIUM 8.5  --  8.7  8.7   Liver Function Tests:  Recent Labs Lab 04/18/15 2256 04/19/15 0931  AST 108* 38*  ALT 69* 52*  ALKPHOS 200* 183*  BILITOT 1.5* 0.7  PROT 8.7* 7.8  ALBUMIN 3.3* 3.1*   No results for input(s): LIPASE, AMYLASE in the last 168 hours. No results for input(s): AMMONIA in the last 168 hours. CBC:  Recent Labs Lab 04/18/15 2255 04/18/15 2311 04/19/15 0931  WBC 14.4*  --  9.5  NEUTROABS 12.1*  --   --   HGB 11.5* 14.3 10.3*  HCT 35.4* 42.0 31.9*  MCV 95.4  --  94.4  PLT 409*  --  400   Cardiac Enzymes:  Recent Labs Lab 04/19/15 0931 04/19/15 1422 04/19/15 2030  TROPONINI 0.06* 0.05* 0.05*   BNP (last 3 results)  Recent Labs  01/13/15 0942 04/18/15 2255   BNP 4164.6* 2821.7*    ProBNP (last 3 results)  Recent Labs  11/19/14 2335 12/12/14 0314  PROBNP 19448.0* 24864.0*    CBG:  Recent Labs Lab 04/19/15 1112 04/19/15 1734 04/19/15 2253 04/20/15 0525 04/20/15 0804  GLUCAP 127* 107* 92 106* 99    Recent Results (from the past 240 hour(s))  MRSA PCR Screening     Status: Abnormal   Collection Time: 04/19/15  8:13 AM  Result Value Ref Range Status   MRSA by PCR POSITIVE (A) NEGATIVE Final    Comment:        The GeneXpert MRSA Assay (FDA approved for NASAL specimens only), is one component of a comprehensive MRSA colonization surveillance program. It is not intended to diagnose MRSA infection nor to guide or monitor treatment for MRSA infections. RESULT CALLED TO, READ BACK BY AND VERIFIED WITH: WOLF RN 9:45 04/19/15 ([email or web address])      Studies: Dg Chest Portable 1 View  04/19/2015   CLINICAL DATA:  Code ST-elevation myocardial infarction. Chest pain and shortness of breath. Initial encounter.  EXAM: PORTABLE CHEST - 1 VIEW  COMPARISON:  Chest radiograph performed 01/14/2015  FINDINGS: The lungs are well-aerated. Vascular congestion is noted, with mildly increased interstitial markings, raising concern for mild pulmonary edema. A small left pleural effusion is suspected. No pneumothorax is seen.  The cardiomediastinal silhouette is mildly enlarged. No acute osseous abnormalities are seen.  IMPRESSION: Vascular congestion and mild cardiomegaly, with mildly increased interstitial markings, raising concern for mild pulmonary edema. Suspect small left pleural effusion.   Electronically Signed   By: Garald Balding M.D.   On: 04/19/2015 00:17   Echocardiogram Left ventricle: The cavity size was mildly dilated. Wall thickness was increased in a pattern of mild LVH. Systolic function was moderately to severely reduced. The estimated ejection fraction was in the range of 30% to 35%. There is akinesis of the anteroseptal and  apical myocardium. Features are consistent with a pseudonormal left ventricular filling pattern, with concomitant abnormal relaxation and increased filling pressure (grade 2 diastolic dysfunction). Doppler parameters are consistent with high ventricular filling pressure. - Mitral valve: Calcified annulus. There was mild regurgitation. - Left atrium: The atrium was mildly dilated. - Pulmonary arteries: Systolic pressure was mildly increased. PA peak pressure: 47 mm Hg (S).  Impressions:  - Anteroseptal and apical akinesis with overall moderate to severe LV dysfunction; mild LVH and LVE; grade 2 diastolic dysfunction with elevated LV filling pressure; no apical thrombus noted using definity; mild LAE; mild MR; mild TR with mildly elevated pulmonary pressure.  Scheduled Meds: . amiodarone  200 mg Oral Daily  . antiseptic oral rinse  7 mL Mouth Rinse BID  . aspirin  81 mg Oral Daily  . aspirin EC  325 mg Oral Once  . atorvastatin  10 mg Oral q1800  . calcium acetate  667 mg Oral TID WC  . Chlorhexidine Gluconate Cloth  6 each Topical Q0600  . clopidogrel  75 mg Oral Daily  . feeding supplement (PRO-STAT SUGAR FREE 64)  30 mL Oral TID WC  . guaiFENesin  600 mg Oral BID  . heparin  5,000 Units Subcutaneous 3 times per day  . insulin aspart  0-5 Units Subcutaneous QHS  . insulin aspart  0-9 Units Subcutaneous TID WC  . metoprolol succinate  50 mg Oral Daily  . mupirocin ointment  1 application Nasal BID  . sodium chloride  3 mL Intravenous Q12H  . sodium chloride  3 mL Intravenous Q12H   Continuous Infusions:   Time spent: 35 minutes  [facility] Pager [phone]. If 7PM-7AM, please contact night-coverage at [email or web address], password [facility] 04/20/2015, 9:49 AM  LOS: 1 day

## 2015-04-20 NOTE — Progress Notes (Signed)
Subjective:  Pt examined and interviewed via interpreter this am. Her dyspnea is improved, no chest pain.  Objective:  Vital Signs in the last 24 hours: Temp:  [97.9 F (36.6 C)-98.5 F (36.9 C)] 98.3 F (36.8 C) (04/26 0530) Pulse Rate:  [71-95] 80 (04/26 0530) Resp:  [10-18] 18 (04/26 0530) BP: (86-163)/(34-126) 137/63 mmHg (04/26 0530) SpO2:  [99 %-100 %] 99 % (04/26 0530) Weight:  [187 lb 6.4 oz (85.004 kg)-187 lb 13.3 oz (85.2 kg)] 187 lb 6.4 oz (85.004 kg) (04/26 0530)  Intake/Output from previous day:  Intake/Output Summary (Last 24 hours) at 04/20/15 0848 Last data filed at 04/19/15 2305  Gross per 24 hour  Intake   1116 ml  Output     50 ml  Net   1066 ml    Physical Exam: General appearance: alert, cooperative and no distress Neck: slight JVD Lungs: decreased at bases Heart: regular rate and rhythm Abdomen: soft, non-tender; bowel sounds normal; no masses,  no organomegaly   Rate: 80-100  Rhythm: normal sinus rhythm  Lab Results:  Recent Labs  04/18/15 2255 04/18/15 2311 04/19/15 0931  WBC 14.4*  --  9.5  HGB 11.5* 14.3 10.3*  PLT 409*  --  400    Recent Labs  04/18/15 2256 04/18/15 2311 04/19/15 0931  NA 122* 123* 135  135  K 7.2* 7.3* 3.6  3.7  CL 84* 90* 94*  94*  CO2 23  --  28  28  GLUCOSE 201* 216* 108*  108*  BUN 60* 89* 22  22  CREATININE 6.78* 6.50* 3.75*  3.80*    Recent Labs  04/19/15 1422 04/19/15 2030  TROPONINI 0.05* 0.05*    Scheduled Meds: . amiodarone  200 mg Oral Daily  . antiseptic oral rinse  7 mL Mouth Rinse BID  . aspirin  81 mg Oral Daily  . aspirin EC  325 mg Oral Once  . atorvastatin  10 mg Oral q1800  . calcium acetate  667 mg Oral TID WC  . Chlorhexidine Gluconate Cloth  6 each Topical Q0600  . clopidogrel  75 mg Oral Daily  . feeding supplement (PRO-STAT SUGAR FREE 64)  30 mL Oral TID WC  . guaiFENesin  600 mg Oral BID  . heparin  5,000 Units Subcutaneous 3 times per day  . insulin  aspart  0-5 Units Subcutaneous QHS  . insulin aspart  0-9 Units Subcutaneous TID WC  . metoprolol succinate  50 mg Oral Daily  . mupirocin ointment  1 application Nasal BID  . sodium chloride  3 mL Intravenous Q12H  . sodium chloride  3 mL Intravenous Q12H     Imaging: Dg Chest Portable 1 View  04/19/2015   CLINICAL DATA:  Code ST-elevation myocardial infarction. Chest pain and shortness of breath. Initial encounter.  EXAM: PORTABLE CHEST - 1 VIEW  COMPARISON:  Chest radiograph performed 01/14/2015  FINDINGS: The lungs are well-aerated. Vascular congestion is noted, with mildly increased interstitial markings, raising concern for mild pulmonary edema. A small left pleural effusion is suspected. No pneumothorax is seen.  The cardiomediastinal silhouette is mildly enlarged. No acute osseous abnormalities are seen.  IMPRESSION: Vascular congestion and mild cardiomegaly, with mildly increased interstitial markings, raising concern for mild pulmonary edema. Suspect small left pleural effusion.   Electronically Signed   By: Garald Balding M.D.   On: 04/19/2015 00:17    Cardiac Studies: Echo 04/19/15 Study Conclusions  - Left ventricle: The cavity size was  mildly dilated. Wall thickness was increased in a pattern of mild LVH. Systolic function was moderately to severely reduced. The estimated ejection fraction was in the range of 30% to 35%. There is akinesis of the anteroseptal and apical myocardium. Features are consistent with a pseudonormal left ventricular filling pattern, with concomitant abnormal relaxation and increased filling pressure (grade 2 diastolic dysfunction). Doppler parameters are consistent with high ventricular filling pressure. - Mitral valve: Calcified annulus. There was mild regurgitation. - Left atrium: The atrium was mildly dilated. - Pulmonary arteries: Systolic pressure was mildly increased. PA peak pressure: 47 mm Hg (S).  Impressions:  -  Anteroseptal and apical akinesis with overall moderate to severe LV dysfunction; mild LVH and LVE; grade 2 diastolic dysfunction with elevated LV filling pressure; no apical thrombus noted using definity; mild LAE; mild MR; mild TR with mildly elevated pulmonary pressure.   Assessment/Plan:  59 y/o AA female who is both blind and deaf. Her blindness is of recent onset. Her past history is complex and includes hypertension, dyslipidemia, T2DM, ESRD on hemodialysis Monday/Wednesday/Friday, large missed anterior MI (diagnosed 11/20/14)  ischemic cardiomyopathy (EF 30-35%, dyskinesis and aneurysm of anterior wall), and VT arrest without ICD implantation due to poor prognosis. She presented 04/18/15 with dyspnea felt most consistent with flash pulmonary edema in the setting of hypertensive emergency. She was also hyperkalemic.She required urgent dialysis for volume overload and hyperkalemia.   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 Lisinopril is on hold because of hyperkalemia. Consider Amlodipine for HTN- will review with MD.    Kerin Ransom PA-C 04/20/2015, 8:48 AM   Personally seen and examined. Agree with above. BP is better now. Will hold off on adding amlodipine. Will fluctuate with dialysis.  If able to take lisinopril, (on hold because of hyperkalemia), ACE_I would be better option given her decreased EF.   Will sign off. Please call if questions.   Candee Furbish, MD

## 2015-04-21 ENCOUNTER — Encounter: Payer: Self-pay | Admitting: *Deleted

## 2015-04-21 DIAGNOSIS — H9190 Unspecified hearing loss, unspecified ear: Secondary | ICD-10-CM | POA: Diagnosis not present

## 2015-04-21 DIAGNOSIS — Z7382 Dual sensory impairment: Secondary | ICD-10-CM | POA: Diagnosis not present

## 2015-04-21 DIAGNOSIS — I5032 Chronic diastolic (congestive) heart failure: Secondary | ICD-10-CM | POA: Diagnosis not present

## 2015-04-21 DIAGNOSIS — F419 Anxiety disorder, unspecified: Secondary | ICD-10-CM | POA: Diagnosis not present

## 2015-04-21 DIAGNOSIS — I5041 Acute combined systolic (congestive) and diastolic (congestive) heart failure: Secondary | ICD-10-CM | POA: Diagnosis not present

## 2015-04-21 DIAGNOSIS — N186 End stage renal disease: Secondary | ICD-10-CM | POA: Diagnosis not present

## 2015-04-21 DIAGNOSIS — S82001A Unspecified fracture of right patella, initial encounter for closed fracture: Secondary | ICD-10-CM | POA: Diagnosis not present

## 2015-04-21 DIAGNOSIS — S99922A Unspecified injury of left foot, initial encounter: Secondary | ICD-10-CM | POA: Diagnosis not present

## 2015-04-21 DIAGNOSIS — I5043 Acute on chronic combined systolic (congestive) and diastolic (congestive) heart failure: Secondary | ICD-10-CM | POA: Diagnosis not present

## 2015-04-21 DIAGNOSIS — E1322 Other specified diabetes mellitus with diabetic chronic kidney disease: Secondary | ICD-10-CM | POA: Diagnosis not present

## 2015-04-21 DIAGNOSIS — E8770 Fluid overload, unspecified: Secondary | ICD-10-CM | POA: Diagnosis not present

## 2015-04-21 DIAGNOSIS — M25562 Pain in left knee: Secondary | ICD-10-CM | POA: Diagnosis not present

## 2015-04-21 DIAGNOSIS — K5090 Crohn's disease, unspecified, without complications: Secondary | ICD-10-CM | POA: Diagnosis present

## 2015-04-21 DIAGNOSIS — M25552 Pain in left hip: Secondary | ICD-10-CM | POA: Diagnosis not present

## 2015-04-21 DIAGNOSIS — E1122 Type 2 diabetes mellitus with diabetic chronic kidney disease: Secondary | ICD-10-CM | POA: Diagnosis present

## 2015-04-21 DIAGNOSIS — E785 Hyperlipidemia, unspecified: Secondary | ICD-10-CM | POA: Diagnosis present

## 2015-04-21 DIAGNOSIS — I5022 Chronic systolic (congestive) heart failure: Secondary | ICD-10-CM | POA: Diagnosis not present

## 2015-04-21 DIAGNOSIS — I255 Ischemic cardiomyopathy: Secondary | ICD-10-CM | POA: Diagnosis not present

## 2015-04-21 DIAGNOSIS — R627 Adult failure to thrive: Secondary | ICD-10-CM | POA: Diagnosis present

## 2015-04-21 DIAGNOSIS — F4320 Adjustment disorder, unspecified: Secondary | ICD-10-CM | POA: Diagnosis not present

## 2015-04-21 DIAGNOSIS — N2581 Secondary hyperparathyroidism of renal origin: Secondary | ICD-10-CM | POA: Diagnosis not present

## 2015-04-21 DIAGNOSIS — I252 Old myocardial infarction: Secondary | ICD-10-CM | POA: Diagnosis not present

## 2015-04-21 DIAGNOSIS — J811 Chronic pulmonary edema: Secondary | ICD-10-CM | POA: Diagnosis not present

## 2015-04-21 DIAGNOSIS — Z7951 Long term (current) use of inhaled steroids: Secondary | ICD-10-CM | POA: Diagnosis not present

## 2015-04-21 DIAGNOSIS — I70245 Atherosclerosis of native arteries of left leg with ulceration of other part of foot: Secondary | ICD-10-CM | POA: Diagnosis present

## 2015-04-21 DIAGNOSIS — Z9889 Other specified postprocedural states: Secondary | ICD-10-CM | POA: Diagnosis not present

## 2015-04-21 DIAGNOSIS — W06XXXA Fall from bed, initial encounter: Secondary | ICD-10-CM | POA: Diagnosis present

## 2015-04-21 DIAGNOSIS — I2510 Atherosclerotic heart disease of native coronary artery without angina pectoris: Secondary | ICD-10-CM | POA: Diagnosis present

## 2015-04-21 DIAGNOSIS — I5020 Unspecified systolic (congestive) heart failure: Secondary | ICD-10-CM | POA: Diagnosis not present

## 2015-04-21 DIAGNOSIS — H540 Blindness, both eyes: Secondary | ICD-10-CM | POA: Diagnosis not present

## 2015-04-21 DIAGNOSIS — R0600 Dyspnea, unspecified: Secondary | ICD-10-CM | POA: Diagnosis not present

## 2015-04-21 DIAGNOSIS — I519 Heart disease, unspecified: Secondary | ICD-10-CM | POA: Diagnosis not present

## 2015-04-21 DIAGNOSIS — H913 Deaf nonspeaking, not elsewhere classified: Secondary | ICD-10-CM | POA: Diagnosis present

## 2015-04-21 DIAGNOSIS — I120 Hypertensive chronic kidney disease with stage 5 chronic kidney disease or end stage renal disease: Secondary | ICD-10-CM | POA: Diagnosis present

## 2015-04-21 DIAGNOSIS — L03032 Cellulitis of left toe: Secondary | ICD-10-CM | POA: Diagnosis not present

## 2015-04-21 DIAGNOSIS — I472 Ventricular tachycardia: Secondary | ICD-10-CM | POA: Diagnosis not present

## 2015-04-21 DIAGNOSIS — Y92122 Bedroom in nursing home as the place of occurrence of the external cause: Secondary | ICD-10-CM | POA: Diagnosis not present

## 2015-04-21 DIAGNOSIS — Z7902 Long term (current) use of antithrombotics/antiplatelets: Secondary | ICD-10-CM | POA: Diagnosis not present

## 2015-04-21 DIAGNOSIS — M79672 Pain in left foot: Secondary | ICD-10-CM | POA: Diagnosis not present

## 2015-04-21 DIAGNOSIS — S82002D Unspecified fracture of left patella, subsequent encounter for closed fracture with routine healing: Secondary | ICD-10-CM | POA: Diagnosis not present

## 2015-04-21 DIAGNOSIS — S82092A Other fracture of left patella, initial encounter for closed fracture: Secondary | ICD-10-CM | POA: Diagnosis not present

## 2015-04-21 DIAGNOSIS — L03116 Cellulitis of left lower limb: Secondary | ICD-10-CM | POA: Diagnosis present

## 2015-04-21 DIAGNOSIS — D649 Anemia, unspecified: Secondary | ICD-10-CM | POA: Diagnosis not present

## 2015-04-21 DIAGNOSIS — Z7982 Long term (current) use of aspirin: Secondary | ICD-10-CM | POA: Diagnosis not present

## 2015-04-21 DIAGNOSIS — M86272 Subacute osteomyelitis, left ankle and foot: Secondary | ICD-10-CM | POA: Diagnosis not present

## 2015-04-21 DIAGNOSIS — Z79899 Other long term (current) drug therapy: Secondary | ICD-10-CM | POA: Diagnosis not present

## 2015-04-21 DIAGNOSIS — E1140 Type 2 diabetes mellitus with diabetic neuropathy, unspecified: Secondary | ICD-10-CM | POA: Diagnosis present

## 2015-04-21 DIAGNOSIS — Z89431 Acquired absence of right foot: Secondary | ICD-10-CM | POA: Diagnosis not present

## 2015-04-21 DIAGNOSIS — S82002A Unspecified fracture of left patella, initial encounter for closed fracture: Secondary | ICD-10-CM | POA: Diagnosis not present

## 2015-04-21 DIAGNOSIS — E871 Hypo-osmolality and hyponatremia: Secondary | ICD-10-CM | POA: Diagnosis present

## 2015-04-21 DIAGNOSIS — J9601 Acute respiratory failure with hypoxia: Secondary | ICD-10-CM | POA: Diagnosis not present

## 2015-04-21 DIAGNOSIS — G8918 Other acute postprocedural pain: Secondary | ICD-10-CM | POA: Diagnosis not present

## 2015-04-21 DIAGNOSIS — D631 Anemia in chronic kidney disease: Secondary | ICD-10-CM | POA: Diagnosis not present

## 2015-04-21 DIAGNOSIS — S82042A Displaced comminuted fracture of left patella, initial encounter for closed fracture: Secondary | ICD-10-CM | POA: Diagnosis not present

## 2015-04-21 DIAGNOSIS — F29 Unspecified psychosis not due to a substance or known physiological condition: Secondary | ICD-10-CM | POA: Diagnosis not present

## 2015-04-21 DIAGNOSIS — M19042 Primary osteoarthritis, left hand: Secondary | ICD-10-CM | POA: Diagnosis not present

## 2015-04-21 DIAGNOSIS — L02612 Cutaneous abscess of left foot: Secondary | ICD-10-CM | POA: Diagnosis present

## 2015-04-21 DIAGNOSIS — I10 Essential (primary) hypertension: Secondary | ICD-10-CM | POA: Diagnosis not present

## 2015-04-21 DIAGNOSIS — I509 Heart failure, unspecified: Secondary | ICD-10-CM | POA: Diagnosis not present

## 2015-04-21 DIAGNOSIS — Z992 Dependence on renal dialysis: Secondary | ICD-10-CM | POA: Diagnosis not present

## 2015-04-21 DIAGNOSIS — I739 Peripheral vascular disease, unspecified: Secondary | ICD-10-CM | POA: Diagnosis not present

## 2015-04-21 DIAGNOSIS — E119 Type 2 diabetes mellitus without complications: Secondary | ICD-10-CM | POA: Diagnosis not present

## 2015-04-21 DIAGNOSIS — E1129 Type 2 diabetes mellitus with other diabetic kidney complication: Secondary | ICD-10-CM | POA: Diagnosis not present

## 2015-04-21 DIAGNOSIS — N189 Chronic kidney disease, unspecified: Secondary | ICD-10-CM | POA: Diagnosis not present

## 2015-04-21 LAB — RENAL FUNCTION PANEL
Albumin: 2.6 g/dL — ABNORMAL LOW (ref 3.5–5.2)
Anion gap: 15 (ref 5–15)
BUN: 66 mg/dL — ABNORMAL HIGH (ref 6–23)
CO2: 23 mmol/L (ref 19–32)
Calcium: 8.5 mg/dL (ref 8.4–10.5)
Chloride: 89 mmol/L — ABNORMAL LOW (ref 96–112)
Creatinine, Ser: 6.56 mg/dL — ABNORMAL HIGH (ref 0.50–1.10)
GFR calc Af Amer: 7 mL/min — ABNORMAL LOW (ref >90)
GFR calc non Af Amer: 6 mL/min — ABNORMAL LOW (ref >90)
Glucose, Bld: 72 mg/dL (ref 70–99)
Phosphorus: 4.7 mg/dL — ABNORMAL HIGH (ref 2.3–4.6)
Potassium: 4.5 mmol/L (ref 3.5–5.1)
Sodium: 127 mmol/L — ABNORMAL LOW (ref 135–145)

## 2015-04-21 LAB — CBC
HCT: 28.1 % — ABNORMAL LOW (ref 36.0–46.0)
Hemoglobin: 9.2 g/dL — ABNORMAL LOW (ref 12.0–15.0)
MCH: 30.7 pg (ref 26.0–34.0)
MCHC: 32.7 g/dL (ref 30.0–36.0)
MCV: 93.7 fL (ref 78.0–100.0)
Platelets: 374 10*3/uL (ref 150–400)
RBC: 3 MIL/uL — ABNORMAL LOW (ref 3.87–5.11)
RDW: 16.2 % — ABNORMAL HIGH (ref 11.5–15.5)
WBC: 8.9 10*3/uL (ref 4.0–10.5)

## 2015-04-21 LAB — GLUCOSE, CAPILLARY
Glucose-Capillary: 75 mg/dL (ref 70–99)
Glucose-Capillary: 144 mg/dL — ABNORMAL HIGH (ref 70–99)

## 2015-04-21 MED ORDER — NEPRO/CARBSTEADY PO LIQD: 237.0000 mL | ORAL | Status: DC | PRN

## 2015-04-21 MED ORDER — HYDROCODONE-ACETAMINOPHEN 5-325 MG PO TABS: 1.0000 | ORAL_TABLET | Freq: Four times a day (QID) | ORAL | Status: DC | PRN

## 2015-04-21 MED ORDER — HYDRALAZINE HCL 25 MG PO TABS: 25.0000 mg | ORAL_TABLET | Freq: Three times a day (TID) | ORAL | Status: DC | PRN

## 2015-04-21 NOTE — Progress Notes (Signed)
Patient ID: TIGER PRIMAS, female   DOB: Mar 24, 1956, 59 y.o.   MRN: [id number]  [facility] Progress Note   Assessment/ Plan:   1. Respiratory distress/pulmonary edema/hyperkalemia: Improved with NIPPV and HD/UF, now apparently doing better and spo2 of 100% on RA--appears back to baseline for DC from renal standpoint. 2.ESRD: On HD at this time without problems- not in any visible distress. 3. Anemia: Hemoglobin 9.2- no overt losses, Aranesp today on HD 4. CKD-MBD: Binders restarted, resume VDRA at HD. 5. Nutrition: Alb 3.1/-prior history of protein calorie malnutrition, continue ONS 6. Hypertension: Encourage compliance with her outpatient antihypertensive therapy, improved status post ultrafiltration with dialysis 7. Blind/ deaf/ mute  Subjective:   No acute events overnight   Objective:   BP 155/77 mmHg  Pulse 82  Temp(Src) 97.4 F (36.3 C) (Oral)  Resp 13  Wt 82 kg (180 lb 12.4 oz)  SpO2 100%  Physical Exam: [id number] on HD [id number] RRR, normal s1 and s2  Resp:Coarse BS bilaterally, no rales [id number], flat, NT [id number] LE edema  Labs: BMET  Recent Labs Lab 04/18/15 2256 04/18/15 2311 04/19/15 0931 04/21/15 0912  NA 122* 123* 135  135 127*  K 7.2* 7.3* 3.6  3.7 4.5  CL 84* 90* 94*  94* 89*  CO2 23  --  28  28 23   GLUCOSE 201* 216* 108*  108* 72  BUN 60* 89* 22  22 66*  CREATININE 6.78* 6.50* 3.75*  3.80* 6.56*  CALCIUM 8.5  --  8.7  8.7 8.5  PHOS  --   --   --  4.7*   CBC  Recent Labs Lab 04/18/15 2255 04/18/15 2311 04/19/15 0931 04/21/15 0915  WBC 14.4*  --  9.5 8.9  NEUTROABS 12.1*  --   --   --   HGB 11.5* 14.3 10.3* 9.2*  HCT 35.4* 42.0 31.9* 28.1*  MCV 95.4  --  94.4 93.7  PLT 409*  --  400 374   Medications:    . amiodarone  200 mg Oral Daily  . antiseptic oral rinse  7 mL Mouth Rinse BID  . aspirin  81 mg Oral Daily  . aspirin EC  325 mg Oral Once  . atorvastatin  10 mg Oral q1800  . calcium acetate   667 mg Oral TID WC  . Chlorhexidine Gluconate Cloth  6 each Topical Q0600  . clopidogrel  75 mg Oral Daily  . feeding supplement (PRO-STAT SUGAR FREE 64)  30 mL Oral TID WC  . guaiFENesin  600 mg Oral BID  . heparin  5,000 Units Subcutaneous 3 times per day  . insulin aspart  0-5 Units Subcutaneous QHS  . insulin aspart  0-9 Units Subcutaneous TID WC  . metoprolol succinate  50 mg Oral Daily  . mupirocin ointment  1 application Nasal BID  . sodium chloride  3 mL Intravenous Q12H  . sodium chloride  3 mL Intravenous Q12H   Elmarie Shiley, MD 04/21/2015, 10:34 AM

## 2015-04-21 NOTE — Evaluation (Signed)
Physical Therapy Evaluation and Discharge Patient Details Name: [name] MRN: [id number] DOB: [birth year] Today's Date: 04/21/2015   History of Present Illness  59 y.o. female with history of congenital deaf mutism, anterior wall infarct, and blindness, admitted for  Acute combined systolic and diastolic heart failure.  Clinical Impression  Patient evaluated by Physical Therapy with no further acute PT needs identified. Patient is awaiting discharge and would greatly benefit from continued therapy at SNF. Tolerated transfer training today with min assist for balance and guidance. Apparently patient was ambulating up to >200 feet in December, 50 feet in January, and now reports her distance is limited even further. Interpreter utilized in room for communication. See below for any follow-up Physial Therapy or equipment needs. PT is signing off. Thank you for this referral.     Follow Up Recommendations SNF    Equipment Recommendations  None recommended by PT    Recommendations for Other Services       Precautions / Restrictions Precautions Precautions: Fall Restrictions Weight Bearing Restrictions: No      Mobility  Bed Mobility Overal bed mobility: Needs Assistance Bed Mobility: Supine to Sit     Supine to sit: Min assist;HOB elevated     General bed mobility comments: min assist for truncal support. tactile cues for desired movement.  Transfers Overall transfer level: Needs assistance Equipment used: 1 person hand held assist Transfers: Sit to/from [facility] Sit to Stand: Min assist Stand pivot transfers: Min assist       General transfer comment: Min assist for balance with hand held assist for guidance. transfered from lowest bed setting to [facility], then [facility] to reclining chair.  Moderate instability, corrected with support from PT.  Ambulation/Gait                Stairs            Wheelchair Mobility    Modified Rankin (Stroke  Patients Only)       Balance Overall balance assessment: Needs assistance Sitting-balance support: No upper extremity supported;Feet supported Sitting balance-Leahy Scale: Fair     Standing balance support: Single extremity supported Standing balance-Leahy Scale: Poor                               Pertinent Vitals/Pain      Home Living Family/patient expects to be discharged to:: Skilled nursing facility   Available Help at Discharge: [place] Type of Home: [place]           Additional Comments: Pt at [facility] - husband also with deafness, lives there.    Prior Function Level of Independence: Needs assistance   Gait / Transfers Assistance Needed: States she ambulates short distances but becomes winded easily.     Comments: Spoke with therapy unit from [facility]. her last PT appointment was in january and pt was ambulatory up to 50 feet with an assistive device. Pt states she has been ambulatory for short bouts since this time.     Hand Dominance   Dominant Hand: Right    Extremity/Trunk Assessment   Upper Extremity Assessment: Defer to OT evaluation           Lower Extremity Assessment: Generalized weakness (bandaged feet)         Communication   Communication: Deaf;Interpreter utilized [name], contracted interpreter assisted with communication)  Cognition Arousal/Alertness: Awake/alert Behavior During Therapy: WFL for tasks assessed/performed Overall  Cognitive Status: Difficult to assess                      General Comments General comments (skin integrity, edema, etc.): Interpreter used in room for communication with patient. Patient very grateful of staff during this visit. Would like to return to [facility] for physical therapy.` SpO2 96% on room air.    Exercises        Assessment/Plan    PT Assessment All further PT needs can be met in the next venue of care  PT Diagnosis  Difficulty walking;Generalized weakness   PT Problem List Decreased range of motion;Decreased activity tolerance;Decreased balance;Decreased mobility;Decreased knowledge of use of DME  PT Treatment Interventions DME instruction;Gait training;Functional mobility training;Therapeutic activities;Therapeutic exercise;Balance training;Neuromuscular re-education;Patient/family education   PT Goals (Current goals can be found in the Care Plan section) Acute Rehab PT Goals Patient Stated Goal: Get therapy again PT Goal Formulation: All assessment and education complete, DC therapy    Frequency     Barriers to discharge        Co-evaluation               End of Session   Activity Tolerance: Patient tolerated treatment well Patient left: in chair;with call bell/phone within reach Nurse Communication: Mobility status         Time: 9528-4132 PT Time Calculation (min) (ACUTE ONLY): 20 min   Charges:   PT Evaluation $Initial PT Evaluation Tier I: 1 Procedure     PT G CodesEllouise [name] 04/21/2015, 4:22 PM [name] [name], [place]

## 2015-04-21 NOTE — Clinical Documentation Improvement (Signed)
Presents with  Acute on Chronic Systolic and Diastolic Heart Failure (Dr. Jani Gravel), Volume Overload with ESRD.   Patient with abnormal lab values  Na+ 122, 123 on admission, 135 on 4/25 and 127 on the 27th  Being treated with 0.9% NaCl infusion  Please provide a diagnosis associated with the above clinical indicators and treatment provided and document findings in next progress note and include in discharge summary if applicable.                            Other Condition___________________                 Cannot Clinically Determine_________   Hoyt Koch, Zoila Shutter ,RN Clinical Documentation Specialist:  [phone]  [facility]

## 2015-04-21 NOTE — Clinical Social Work Note (Signed)
Per MD patient ready to DC back to [facility]. RN, patient/family, and facility notified of patient's DC. RN given number for report. DC packet on patient's chart. Ambulance transport requested for patient for next available pick up. Patient's son states he wishes to have the patient go to another [facility] term facility, but CSW explained that the patient is medically stable and other facilities can be pursued from the facility. CSW signing off at this time.   Liz Beach MSW, [place], [place], JI:[phone]

## 2015-04-21 NOTE — Progress Notes (Signed)
Physical Therapy Cancellation Note  Patient has been taken off unit for hemodialysis. Will follow up for PT evaluation as schedule/time allows.  [address] [place], [place]   04/21/2015  -- 10:20

## 2015-04-21 NOTE — Procedures (Signed)
Patient seen on Hemodialysis. QB 400, UF goal 4L Treatment adjusted as needed.  [name] Shiley MD [facility]. Office # [phone] Pager # [phone] 10:38 AM

## 2015-04-21 NOTE — Progress Notes (Signed)
Pt prepared for d/c to SNF. IV d/c'd. Skin intact except as most recently charted. Vitals are stable. Report called to receiving facility. Pt to be transported by ambulance service. 

## 2015-04-21 NOTE — Discharge Summary (Signed)
Physician Discharge Summary  [name] [id number] DOB: February 20, 1956 DOA: 04/18/2015  PCP: [name], DO  Admit date: 04/18/2015 Discharge date: 04/21/2015  Time spent: 35 minutes  Recommendations for Outpatient Follow-up:  1. 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 Pt is deaf as well as blind, and is a poor historian. Pt was brought to ED and noted to have hyperkalemia 7.3 And also probable CHF. Nephrology has been consulted and also cardiology. Pt will be admitted with CHF, hyperkalemia  Hospital Course:  Acute combined systolic and diastolic heart failure: Much improved with control of blood pressure and urgent dialysis. off oxygen completely.    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  Hx of Sustained VT- not an ICD candidate  Blindness with deafness  Hyperkalemia: Resolved with dialysis  ESRD (end stage renal disease)  Accelerated hypertension: Improved. Unclear what led to her initial presentation. PRN hydralazine  Diastolic  dysfunction-grade 2  Acute respiratory failure: Initially requiring BiPAP- improved  Procedures: HD  Consultations:  renal  Discharge Exam: Filed Vitals:   04/21/15 1245  BP: 168/92  Pulse: 85  Temp: 98.2 F (36.8 C)  Resp: 16    General: awake hungry, just back from [place] Cardiovascular: rrr Respiratory: clear  Discharge Instructions   Discharge Instructions    Discharge instructions    Complete by:  As directed   PRN hydralazine Continue HD Renal diet DNR     Increase activity slowly    Complete by:  As directed           Current Discharge Medication List    START taking these medications   Details  hydrALAZINE (APRESOLINE) 25 MG tablet Take 1 tablet (25 mg total) by mouth 3 (three) times daily as needed (SBP > 150).    Nutritional Supplements (FEEDING SUPPLEMENT, NEPRO CARB STEADY,) LIQD Take 237 mLs by mouth as needed (missed meal during dialysis.). Refills: 0      CONTINUE these medications which have CHANGED   Details  HYDROcodone-acetaminophen (NORCO) 5-325 MG per tablet Take 1 tablet by mouth every 6 (six) hours as needed. Qty: 15 tablet, Refills: 0      CONTINUE these medications which have NOT CHANGED   Details  amiodarone (PACERONE) 200 MG tablet Take 200 mg by mouth daily.    aspirin 81 MG tablet Take 81 mg by mouth daily. For hypertension    atorvastatin (LIPITOR) 10 MG tablet Take 10 mg by mouth daily.    calcium acetate (PHOSLO) 667 MG capsule Take 1 capsule (667 mg total) by mouth  3 (three) times daily with meals. Qty: 30 capsule, Refills: 1    clopidogrel (PLAVIX) 75 MG tablet Take 1 tablet (75 mg total) by mouth daily.    Docusate Sodium (DSS) 100 MG CAPS Take 100 mg by mouth 2 (two) times daily. For constipation    guaiFENesin (MUCINEX) 600 MG 12 hr tablet Take 1 tablet (600 mg total) by mouth 2 (two) times daily.    lisinopril (PRINIVIL,ZESTRIL) 2.5 MG tablet Take 1 tablet (2.5 mg total) by mouth daily.    metoprolol  succinate (TOPROL-XL) 50 MG 24 hr tablet Take 1 tablet (50 mg total) by mouth daily. Take with or immediately following a meal.    Multiple Vitamin (MULTIVITAMIN WITH MINERALS) TABS tablet Take 2 tablets by mouth daily.    acetaminophen (TYLENOL) 325 MG tablet Take 650 mg by mouth 3 (three) times daily as needed.    Amino Acids-Protein Hydrolys (FEEDING SUPPLEMENT, PRO-STAT SUGAR FREE 64,) LIQD Take 30 mLs by mouth 3 (three) times daily with meals. Qty: 900 mL, Refills: 0    lactulose (CHRONULAC) 10 GM/15ML solution Take 30 mLs (20 g total) by mouth 2 (two) times daily as needed for moderate constipation. Qty: 240 mL, Refills: 0    levalbuterol (XOPENEX) 0.63 MG/3ML nebulizer solution Take 3 mLs (0.63 mg total) by nebulization every 2 (two) hours as needed for wheezing or shortness of breath. Qty: 3 mL, Refills: 12    nitroGLYCERIN (NITROSTAT) 0.4 MG SL tablet Place 1 tablet (0.4 mg total) under the tongue every 5 (five) minutes as needed for chest pain. Refills: 12       No Known Allergies    The results of significant diagnostics from this hospitalization (including imaging, microbiology, ancillary and laboratory) are listed below for reference.    Significant Diagnostic Studies: Dg Chest Port 1 View  04/20/2015   CLINICAL DATA:  Shortness of breath and CHF.  EXAM: PORTABLE CHEST - 1 VIEW  COMPARISON:  None.  FINDINGS: Stable chronic cardiomegaly. Pulmonary vascularity is mildly increased. There is mild diffuse interstitial prominence bilaterally. Small focal opacity in the right upper lung field appears new compared to prior studies and most likely reflects atelectasis or focal airspace disease related edema.  No visible pleural effusion with portable technique.  IMPRESSION: Mild congestive heart failure suspected.  Small focal opacity right upper lung field favored to be due to atelectasis or early airspace disease secondary to edema. Suggest attention on follow-up.   Electronically  Signed   By: Curlene Dolphin M.D.   On: 04/20/2015 11:34   Dg Chest Portable 1 View  04/19/2015   CLINICAL DATA:  Code ST-elevation myocardial infarction. Chest pain and shortness of breath. Initial encounter.  EXAM: PORTABLE CHEST - 1 VIEW  COMPARISON:  Chest radiograph performed 01/14/2015  FINDINGS: The lungs are well-aerated. Vascular congestion is noted, with mildly increased interstitial markings, raising concern for mild pulmonary edema. A small left pleural effusion is suspected. No pneumothorax is seen.  The cardiomediastinal silhouette is mildly enlarged. No acute osseous abnormalities are seen.  IMPRESSION: Vascular congestion and mild cardiomegaly, with mildly increased interstitial markings, raising concern for mild pulmonary edema. Suspect small left pleural effusion.   Electronically Signed   By: Garald Balding M.D.   On: 04/19/2015 00:17    Microbiology: Recent Results (from the past 240 hour(s))  MRSA PCR Screening     Status: Abnormal   Collection Time: 04/19/15  8:13 AM  Result Value Ref Range Status   MRSA by  PCR POSITIVE (A) NEGATIVE Final    Comment:        The GeneXpert MRSA Assay (FDA approved for NASAL specimens only), is one component of a comprehensive MRSA colonization surveillance program. It is not intended to diagnose MRSA infection nor to guide or monitor treatment for MRSA infections. RESULT CALLED TO, READ BACK BY AND VERIFIED WITH: WOLF RN 9:45 04/19/15 ([email or web address])      Labs: Basic Metabolic Panel:  Recent Labs Lab 04/18/15 2256 04/18/15 2311 04/19/15 0931 04/21/15 0912  NA 122* 123* 135  135 127*  K 7.2* 7.3* 3.6  3.7 4.5  CL 84* 90* 94*  94* 89*  CO2 23  --  28  28 23   GLUCOSE 201* 216* 108*  108* 72  BUN 60* 89* 22  22 66*  CREATININE 6.78* 6.50* 3.75*  3.80* 6.56*  CALCIUM 8.5  --  8.7  8.7 8.5  PHOS  --   --   --  4.7*   Liver Function Tests:  Recent Labs Lab 04/18/15 2256 04/19/15 0931 04/21/15 0912  AST 108* 38*  --    ALT 69* 52*  --   ALKPHOS 200* 183*  --   BILITOT 1.5* 0.7  --   PROT 8.7* 7.8  --   ALBUMIN 3.3* 3.1* 2.6*   No results for input(s): LIPASE, AMYLASE in the last 168 hours. No results for input(s): AMMONIA in the last 168 hours. CBC:  Recent Labs Lab 04/18/15 2255 04/18/15 2311 04/19/15 0931 04/21/15 0915  WBC 14.4*  --  9.5 8.9  NEUTROABS 12.1*  --   --   --   HGB 11.5* 14.3 10.3* 9.2*  HCT 35.4* 42.0 31.9* 28.1*  MCV 95.4  --  94.4 93.7  PLT 409*  --  400 374   Cardiac Enzymes:  Recent Labs Lab 04/19/15 0931 04/19/15 1422 04/19/15 2030  TROPONINI 0.06* 0.05* 0.05*   BNP: BNP (last 3 results)  Recent Labs  01/13/15 0942 04/18/15 2255  BNP 4164.6* 2821.7*    ProBNP (last 3 results)  Recent Labs  11/19/14 2335 12/12/14 0314  PROBNP 19448.0* 24864.0*    CBG:  Recent Labs Lab 04/20/15 0804 04/20/15 1156 04/20/15 1653 04/20/15 2238 04/21/15 1319  GLUCAP 99 100* 95 110* 75       Signed:  VANN, JESSICA  [facility] 04/21/2015, 2:05 PM

## 2015-04-22 ENCOUNTER — Non-Acute Institutional Stay (SKILLED_NURSING_FACILITY): Payer: Medicare Other | Admitting: Internal Medicine

## 2015-04-22 ENCOUNTER — Encounter: Payer: Self-pay | Admitting: Internal Medicine

## 2015-04-22 DIAGNOSIS — I739 Peripheral vascular disease, unspecified: Secondary | ICD-10-CM

## 2015-04-22 DIAGNOSIS — I5032 Chronic diastolic (congestive) heart failure: Secondary | ICD-10-CM

## 2015-04-22 DIAGNOSIS — H540 Blindness, both eyes: Secondary | ICD-10-CM

## 2015-04-22 DIAGNOSIS — H547 Unspecified visual loss: Secondary | ICD-10-CM

## 2015-04-22 DIAGNOSIS — N186 End stage renal disease: Secondary | ICD-10-CM | POA: Diagnosis not present

## 2015-04-22 DIAGNOSIS — I255 Ischemic cardiomyopathy: Secondary | ICD-10-CM | POA: Diagnosis not present

## 2015-04-22 DIAGNOSIS — H9190 Unspecified hearing loss, unspecified ear: Secondary | ICD-10-CM | POA: Diagnosis not present

## 2015-04-22 DIAGNOSIS — Z7382 Dual sensory impairment: Secondary | ICD-10-CM | POA: Diagnosis not present

## 2015-04-22 DIAGNOSIS — I10 Essential (primary) hypertension: Secondary | ICD-10-CM

## 2015-04-22 NOTE — Progress Notes (Signed)
Patient ID: [name], female   DOB: 20-Dec-1956, 59 y.o.   MRN: [id number]   04/22/15  HISTORY AND PHYSICAL  Location:  [facility]    Place of Service: SNF [id number])   Extended Emergency Contact Information Primary Emergency Contact: Geralds,[name] Address: [place], [place] [address] [place] Phone: [phone] Relation: Spouse Secondary Emergency Contact: [name] of [place] Phone: [phone] Mobile Phone: [phone] Relation: Sister  Advanced Directive information  FULL CODE  Chief Complaint  Patient presents with  . Readmit To SNF    HPI:  59 yo female seen today for readmission into SNF following hospital stay for acute/chronic diastolic HF, ESRD/HD, accelerated HTN, ICM w EF 30-35%. Hospital records reviewed. She had urgent HD which improved heart failure. Hydralazine added to BP meds. No other changes made  Since readmission to SNF, BP has been stable. She is taking hydralazine, metoprolol. Amiodarone is for hx Vtach. 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 Hx obtained from chart. No nursing issues. No falls.  Past Medical History  Diagnosis Date  . Hyperlipidemia   . Hypertension     a. Has previously refused blood pressure meds.   . Deaf     Divorced from husband but lives with him. Has daughter but she does not care for her.  . Bilateral leg edema     a. Chronic.  Marland Kitchen Psychiatric disorder     She frequently exhibits paranoia and has been diagnosed with psychotic d/o NOS during hospital stay in the past. She is tangetial and perseverative during her visits. She apparently has had a bad experience with mental health in [place] in the past and refuses to discuss mental issues for fear that she will be sent back there. Her paranoia, communication issues, financial woes and lack of fam  . Fibroid uterus   . Anemia     Due to  fibroids.  . Heart murmur   . Diabetes mellitus     a. Per PCP note 2012 (A1C 9.2) - pt unwilling to take meds and was educated on risk of uncontrolled DM.  b. A1C 5.9 in 11/2013.  Marland Kitchen Coronary artery disease   . Blind   . ESRD on hemodialysis     Mon, Wed, Fri  . CHF (congestive heart failure)   . MI (myocardial infarction) 11/2014    Past Surgical History  Procedure Laterality Date  . No past surgeries    . Insertion of dialysis catheter Right 01/02/2014    Procedure: INSERTION OF DIALYSIS CATHETER;  Surgeon: Rosetta Posner, MD;  Location: [facility];  Service: Vascular;  Laterality: Right;  . Av fistula placement Left 01/02/2014    Procedure: ARTERIOVENOUS (AV) FISTULA CREATION;  Surgeon: Rosetta Posner, MD;  Location: [facility];  Service: Vascular;  Laterality: Left;  . Amputation Bilateral 04/02/2015    Procedure: Amputation Bilateral Great Toes MTP Joint;  Surgeon: Newt Minion, MD;  Location: [facility];  Service: Orthopedics;  Laterality: Bilateral;    Patient Care Team: [name], DO as PCP - General (Internal Medicine)  History   Social History  . Marital Status: Married    Spouse Name: N/A  . Number of Children: N/A  . Years of Education: N/A   Occupational History  . Not on file.   Social History Main Topics  .  Smoking status: Never Smoker   . Smokeless tobacco: Not on file     Comment: Prior dip  . Alcohol Use: No  . Drug Use: No  . Sexual Activity: Not on file   Other Topics Concern  . Not on file   Social History Narrative     reports that she has never smoked. She does not have any smokeless tobacco history on file. She reports that she does not drink alcohol or use illicit drugs.  Family History  Problem Relation Age of Onset  . Other Father     Drowned from fishing?  . Heart disease     Family Status  Relation Status Death Age  . Mother Deceased     Unknown  . Father Deceased     Immunization History  Administered Date(s) Administered  .  Influenza,inj,Quad PF,36+ Mos 01/03/2014  . Influenza-Unspecified 09/24/2014  . PPD Test 07/28/2014  . Pneumococcal Polysaccharide-23 01/03/2014    No Known Allergies  Medications: Patient's Medications  New Prescriptions   No medications on file  Previous Medications   ACETAMINOPHEN (TYLENOL) 325 MG TABLET    Take 650 mg by mouth 3 (three) times daily as needed.   AMINO ACIDS-PROTEIN HYDROLYS (FEEDING SUPPLEMENT, PRO-STAT SUGAR FREE 64,) LIQD    Take 30 mLs by mouth 3 (three) times daily with meals.   AMIODARONE (PACERONE) 200 MG TABLET    Take 200 mg by mouth daily.   ASPIRIN 81 MG TABLET    Take 81 mg by mouth daily. For hypertension   ATORVASTATIN (LIPITOR) 10 MG TABLET    Take 10 mg by mouth daily.   CALCIUM ACETATE (PHOSLO) 667 MG CAPSULE    Take 1 capsule (667 mg total) by mouth 3 (three) times daily with meals.   CLOPIDOGREL (PLAVIX) 75 MG TABLET    Take 1 tablet (75 mg total) by mouth daily.   DOCUSATE SODIUM (DSS) 100 MG CAPS    Take 100 mg by mouth 2 (two) times daily. For constipation   GUAIFENESIN (MUCINEX) 600 MG 12 HR TABLET    Take 1 tablet (600 mg total) by mouth 2 (two) times daily.   HYDRALAZINE (APRESOLINE) 25 MG TABLET    Take 1 tablet (25 mg total) by mouth 3 (three) times daily as needed (SBP > 150).   HYDROCODONE-ACETAMINOPHEN (NORCO) 5-325 MG PER TABLET    Take 1 tablet by mouth every 6 (six) hours as needed.   LACTULOSE (CHRONULAC) 10 GM/15ML SOLUTION    Take 30 mLs (20 g total) by mouth 2 (two) times daily as needed for moderate constipation.   LEVALBUTEROL (XOPENEX) 0.63 MG/3ML NEBULIZER SOLUTION    Take 3 mLs (0.63 mg total) by nebulization every 2 (two) hours as needed for wheezing or shortness of breath.   LISINOPRIL (PRINIVIL,ZESTRIL) 2.5 MG TABLET    Take 1 tablet (2.5 mg total) by mouth daily.   METOPROLOL SUCCINATE (TOPROL-XL) 50 MG 24 HR TABLET    Take 1 tablet (50 mg total) by mouth daily. Take with or immediately following a meal.   MULTIPLE VITAMIN  (MULTIVITAMIN WITH MINERALS) TABS TABLET    Take 2 tablets by mouth daily.   NITROGLYCERIN (NITROSTAT) 0.4 MG SL TABLET    Place 1 tablet (0.4 mg total) under the tongue every 5 (five) minutes as needed for chest pain.   NUTRITIONAL SUPPLEMENTS (FEEDING SUPPLEMENT, NEPRO CARB STEADY,) LIQD    Take 237 mLs by mouth as needed (missed meal during dialysis.).  Modified Medications  No medications on file  Discontinued Medications   No medications on file    Review of Systems  Unable to perform ROS: Patient nonverbal    Filed Vitals:   04/22/15 1337  BP: 136/76  Pulse: 79  Temp: 98.3 F (36.8 C)   There is no weight on file to calculate BMI.  Physical Exam  Constitutional: She appears well-developed and well-nourished.  HENT:  Mouth/Throat: Oropharynx is clear and moist. No oropharyngeal exudate.  Eyes: No scleral icterus.  Neck: Neck supple. Carotid bruit is not present. No tracheal deviation present. No thyromegaly present.  Cardiovascular: Normal rate, regular rhythm and intact distal pulses.  Exam reveals no gallop and no friction rub.   Murmur (1/6 SEM) heard. No LE edema b/l. no calf TTP.  Left arm thrill palpable and bruit audible  Pulmonary/Chest: Effort normal and breath sounds normal. No stridor. No respiratory distress. She has no wheezes. She has no rales.  Abdominal: Soft. Bowel sounds are normal. She exhibits no distension and no mass. There is no hepatomegaly. There is no tenderness. There is no rebound and no guarding.  Musculoskeletal: She exhibits edema and tenderness.  Left foot wrapped and bandage c/d/i. Right TMA  Lymphadenopathy:    She has no cervical adenopathy.  Neurological: She is alert.  Skin: Skin is warm and dry. No rash noted.  Psychiatric: She has a normal mood and affect. Her behavior is normal.     Labs reviewed: Admission on 04/18/2015, Discharged on 04/21/2015  Component Date Value Ref Range Status  . Sodium 04/18/2015 123* 135 - 145  mmol/L Final  . Potassium 04/18/2015 7.3* 3.5 - 5.1 mmol/L Final  . Chloride 04/18/2015 90* 96 - 112 mmol/L Final  . BUN 04/18/2015 89* 6 - 23 mg/dL Final  . Creatinine, Ser 04/18/2015 6.50* 0.50 - 1.10 mg/dL Final  . Glucose, Bld 04/18/2015 216* 70 - 99 mg/dL Final  . Calcium, Ion 04/18/2015 1.00* 1.12 - 1.23 mmol/L Final  . TCO2 04/18/2015 24  0 - 100 mmol/L Final  . Hemoglobin 04/18/2015 14.3  12.0 - 15.0 g/dL Final  . HCT 04/18/2015 42.0  36.0 - 46.0 % Final  . Comment 04/18/2015 NOTIFIED PHYSICIAN   Final  . WBC 04/18/2015 14.4* 4.0 - 10.5 K/uL Final  . RBC 04/18/2015 3.71* 3.87 - 5.11 MIL/uL Final  . Hemoglobin 04/18/2015 11.5* 12.0 - 15.0 g/dL Final  . HCT 04/18/2015 35.4* 36.0 - 46.0 % Final  . MCV 04/18/2015 95.4  78.0 - 100.0 fL Final  . MCH 04/18/2015 31.0  26.0 - 34.0 pg Final  . MCHC 04/18/2015 32.5  30.0 - 36.0 g/dL Final  . RDW 04/18/2015 16.6* 11.5 - 15.5 % Final  . Platelets 04/18/2015 409* 150 - 400 K/uL Final  . Neutrophils Relative % 04/18/2015 85* 43 - 77 % Final  . Neutro Abs 04/18/2015 12.1* 1.7 - 7.7 K/uL Final  . Lymphocytes Relative 04/18/2015 8* 12 - 46 % Final  . Lymphs Abs 04/18/2015 1.2  0.7 - 4.0 K/uL Final  . Monocytes Relative 04/18/2015 6  3 - 12 % Final  . Monocytes Absolute 04/18/2015 0.9  0.1 - 1.0 K/uL Final  . Eosinophils Relative 04/18/2015 1  0 - 5 % Final  . Eosinophils Absolute 04/18/2015 0.2  0.0 - 0.7 K/uL Final  . Basophils Relative 04/18/2015 0  0 - 1 % Final  . Basophils Absolute 04/18/2015 0.1  0.0 - 0.1 K/uL Final  . Troponin i, poc 04/18/2015 0.04  0.00 -  0.08 ng/mL Final  . Comment 3 04/18/2015          Final   Comment: Due to the release kinetics of cTnI, a negative result within the first hours of the onset of symptoms does not rule out myocardial infarction with certainty. If myocardial infarction is still suspected, repeat the test at appropriate intervals.   . B Natriuretic Peptide 04/18/2015 2821.7* 0.0 - 100.0 pg/mL  Final  . Sodium 04/18/2015 122* 135 - 145 mmol/L Final  . Potassium 04/18/2015 7.2* 3.5 - 5.1 mmol/L Final   Comment: CRITICAL RESULT CALLED TO, READ BACK BY AND VERIFIED WITH: MIZE,S RN 04/19/2015 0041 JORDANS REPEATED TO VERIFY SPECIMEN HEMOLYZED. HEMOLYSIS MAY AFFECT INTEGRITY OF RESULTS.   Marland Kitchen Chloride 04/18/2015 84* 96 - 112 mmol/L Final  . CO2 04/18/2015 23  19 - 32 mmol/L Final  . Glucose, Bld 04/18/2015 201* 70 - 99 mg/dL Final  . BUN 04/18/2015 60* 6 - 23 mg/dL Final  . Creatinine, Ser 04/18/2015 6.78* 0.50 - 1.10 mg/dL Final  . Calcium 04/18/2015 8.5  8.4 - 10.5 mg/dL Final  . Total Protein 04/18/2015 8.7* 6.0 - 8.3 g/dL Final  . Albumin 04/18/2015 3.3* 3.5 - 5.2 g/dL Final  . AST 04/18/2015 108* 0 - 37 U/L Final  . ALT 04/18/2015 69* 0 - 35 U/L Final  . Alkaline Phosphatase 04/18/2015 200* 39 - 117 U/L Final  . Total Bilirubin 04/18/2015 1.5* 0.3 - 1.2 mg/dL Final  . GFR calc non Af Amer 04/18/2015 6* >90 mL/min Final  . GFR calc Af Amer 04/18/2015 7* >90 mL/min Final   Comment: (NOTE) The eGFR has been calculated using the CKD EPI equation. This calculation has not been validated in all clinical situations. eGFR's persistently <90 mL/min signify possible Chronic Kidney Disease.   . Anion gap 04/18/2015 15  5 - 15 Final  . Hepatitis B Surface Ag 04/19/2015 NEGATIVE  NEGATIVE Final   Performed at [facility]  . Troponin I 04/19/2015 0.06* <0.031 ng/mL Final   Comment:        PERSISTENTLY INCREASED TROPONIN VALUES IN THE RANGE OF 0.04-0.49 ng/mL CAN BE SEEN IN:       -UNSTABLE ANGINA       -CONGESTIVE HEART FAILURE       -MYOCARDITIS       -CHEST TRAUMA       -ARRYHTHMIAS       -LATE PRESENTING MYOCARDIAL INFARCTION       -COPD   CLINICAL FOLLOW-UP RECOMMENDED.   Marland Kitchen Troponin I 04/19/2015 0.05* <0.031 ng/mL Final   Comment:        PERSISTENTLY INCREASED TROPONIN VALUES IN THE RANGE OF 0.04-0.49 ng/mL CAN BE SEEN IN:       -UNSTABLE ANGINA        -CONGESTIVE HEART FAILURE       -MYOCARDITIS       -CHEST TRAUMA       -ARRYHTHMIAS       -LATE PRESENTING MYOCARDIAL INFARCTION       -COPD   CLINICAL FOLLOW-UP RECOMMENDED.   Marland Kitchen Troponin I 04/19/2015 0.05* <0.031 ng/mL Final   Comment:        PERSISTENTLY INCREASED TROPONIN VALUES IN THE RANGE OF 0.04-0.49 ng/mL CAN BE SEEN IN:       -UNSTABLE ANGINA       -CONGESTIVE HEART FAILURE       -MYOCARDITIS       -CHEST TRAUMA       -ARRYHTHMIAS       -  LATE PRESENTING MYOCARDIAL INFARCTION       -COPD   CLINICAL FOLLOW-UP RECOMMENDED.   . WBC 04/19/2015 9.5  4.0 - 10.5 K/uL Final  . RBC 04/19/2015 3.38* 3.87 - 5.11 MIL/uL Final  . Hemoglobin 04/19/2015 10.3* 12.0 - 15.0 g/dL Final   Comment: SPECIMEN CHECKED FOR CLOTS REPEATED TO VERIFY   . HCT 04/19/2015 31.9* 36.0 - 46.0 % Final  . MCV 04/19/2015 94.4  78.0 - 100.0 fL Final   CONSISTENT WITH PREVIOUS RESULT  . [place] 04/19/2015 30.5  26.0 - 34.0 pg Final  . MCHC 04/19/2015 32.3  30.0 - 36.0 g/dL Final  . RDW 04/19/2015 16.5* 11.5 - 15.5 % Final   CONSISTENT WITH PREVIOUS RESULT  . Platelets 04/19/2015 400  150 - 400 K/uL Final   Comment: SPECIMEN CHECKED FOR CLOTS REPEATED TO VERIFY CONSISTENT WITH PREVIOUS RESULT   . Sodium 04/19/2015 135  135 - 145 mmol/L Final  . Potassium 04/19/2015 3.6  3.5 - 5.1 mmol/L Final  . Chloride 04/19/2015 94* 96 - 112 mmol/L Final  . CO2 04/19/2015 28  19 - 32 mmol/L Final  . Glucose, Bld 04/19/2015 108* 70 - 99 mg/dL Final  . BUN 04/19/2015 22  6 - 23 mg/dL Final  . Creatinine, Ser 04/19/2015 3.75* 0.50 - 1.10 mg/dL Final  . Calcium 04/19/2015 8.7  8.4 - 10.5 mg/dL Final  . Total Protein 04/19/2015 7.8  6.0 - 8.3 g/dL Final  . Albumin 04/19/2015 3.1* 3.5 - 5.2 g/dL Final  . AST 04/19/2015 38* 0 - 37 U/L Final  . ALT 04/19/2015 52* 0 - 35 U/L Final  . Alkaline Phosphatase 04/19/2015 183* 39 - 117 U/L Final  . Total Bilirubin 04/19/2015 0.7  0.3 - 1.2 mg/dL Final  . GFR calc non Af Amer  04/19/2015 12* >90 mL/min Final  . GFR calc Af Amer 04/19/2015 14* >90 mL/min Final   Comment: (NOTE) The eGFR has been calculated using the CKD EPI equation. This calculation has not been validated in all clinical situations. eGFR's persistently <90 mL/min signify possible Chronic Kidney Disease.   . Anion gap 04/19/2015 13  5 - 15 Final  . TSH 04/19/2015 1.839  0.350 - 4.500 uIU/mL Final  . Hgb A1c MFr Bld 04/19/2015 5.4  4.8 - 5.6 % Final   Comment: (NOTE)         Pre-diabetes: 5.7 - 6.4         Diabetes: >6.4         Glycemic control for adults with diabetes: <7.0   . Mean Plasma Glucose 04/19/2015 108   Final   Comment: (NOTE) Performed At: [facility] [place], [place] [id number] Lindon Romp MD WU:[id number]   . MRSA by PCR 04/19/2015 POSITIVE* NEGATIVE Final   Comment:        The GeneXpert MRSA Assay (FDA approved for NASAL specimens only), is one component of a comprehensive MRSA colonization surveillance program. It is not intended to diagnose MRSA infection nor to guide or monitor treatment for MRSA infections. RESULT CALLED TO, READ BACK BY AND VERIFIED WITH: WOLF RN 9:45 04/19/15 ([email or web address])   . Glucose-Capillary 04/19/2015 97  70 - 99 mg/dL Final  . Comment 1 04/19/2015 Capillary Specimen   Final  . Sodium 04/19/2015 135  135 - 145 mmol/L Final  . Potassium 04/19/2015 3.7  3.5 - 5.1 mmol/L Final  . Chloride 04/19/2015 94* 96 - 112 mmol/L Final  . CO2 04/19/2015 28  19 -  32 mmol/L Final  . Glucose, Bld 04/19/2015 108* 70 - 99 mg/dL Final  . BUN 04/19/2015 22  6 - 23 mg/dL Final  . Creatinine, Ser 04/19/2015 3.80* 0.50 - 1.10 mg/dL Final   DELTA CHECK NOTED  . Calcium 04/19/2015 8.7  8.4 - 10.5 mg/dL Final  . GFR calc non Af Amer 04/19/2015 12* >90 mL/min Final  . GFR calc Af Amer 04/19/2015 14* >90 mL/min Final   Comment: (NOTE) The eGFR has been calculated using the CKD EPI equation. This calculation has not been validated in  all clinical situations. eGFR's persistently <90 mL/min signify possible Chronic Kidney Disease.   . Anion gap 04/19/2015 13  5 - 15 Final  . Glucose-Capillary 04/19/2015 127* 70 - 99 mg/dL Final  . Comment 1 04/19/2015 Capillary Specimen   Final  . Glucose-Capillary 04/19/2015 107* 70 - 99 mg/dL Final  . Glucose-Capillary 04/19/2015 92  70 - 99 mg/dL Final  . Glucose-Capillary 04/20/2015 106* 70 - 99 mg/dL Final  . Comment 1 04/20/2015 Notify RN   Final  . Comment 2 04/20/2015 Document in Chart   Final  . Glucose-Capillary 04/20/2015 99  70 - 99 mg/dL Final  . Glucose-Capillary 04/20/2015 100* 70 - 99 mg/dL Final  . Glucose-Capillary 04/20/2015 95  70 - 99 mg/dL Final  . Glucose-Capillary 04/20/2015 110* 70 - 99 mg/dL Final  . Comment 1 04/20/2015 Notify RN   Final  . Comment 2 04/20/2015 Document in Chart   Final  . Sodium 04/21/2015 127* 135 - 145 mmol/L Final   DELTA CHECK NOTED  . Potassium 04/21/2015 4.5  3.5 - 5.1 mmol/L Final   DELTA CHECK NOTED  . Chloride 04/21/2015 89* 96 - 112 mmol/L Final  . CO2 04/21/2015 23  19 - 32 mmol/L Final  . Glucose, Bld 04/21/2015 72  70 - 99 mg/dL Final  . BUN 04/21/2015 66* 6 - 23 mg/dL Final   DELTA CHECK NOTED  . Creatinine, Ser 04/21/2015 6.56* 0.50 - 1.10 mg/dL Final   DELTA CHECK NOTED  . Calcium 04/21/2015 8.5  8.4 - 10.5 mg/dL Final  . Phosphorus 04/21/2015 4.7* 2.3 - 4.6 mg/dL Final  . Albumin 04/21/2015 2.6* 3.5 - 5.2 g/dL Final  . GFR calc non Af Amer 04/21/2015 6* >90 mL/min Final  . GFR calc Af Amer 04/21/2015 7* >90 mL/min Final   Comment: (NOTE) The eGFR has been calculated using the CKD EPI equation. This calculation has not been validated in all clinical situations. eGFR's persistently <90 mL/min signify possible Chronic Kidney Disease.   . Anion gap 04/21/2015 15  5 - 15 Final  . WBC 04/21/2015 8.9  4.0 - 10.5 K/uL Final  . RBC 04/21/2015 3.00* 3.87 - 5.11 MIL/uL Final  . Hemoglobin 04/21/2015 9.2* 12.0 - 15.0 g/dL  Final  . HCT 04/21/2015 28.1* 36.0 - 46.0 % Final  . MCV 04/21/2015 93.7  78.0 - 100.0 fL Final  . MCH 04/21/2015 30.7  26.0 - 34.0 pg Final  . MCHC 04/21/2015 32.7  30.0 - 36.0 g/dL Final  . RDW 04/21/2015 16.2* 11.5 - 15.5 % Final  . Platelets 04/21/2015 374  150 - 400 K/uL Final  . Glucose-Capillary 04/21/2015 75  70 - 99 mg/dL Final  . Glucose-Capillary 04/21/2015 144* 70 - 99 mg/dL Final  Admission on 04/02/2015, Discharged on 04/02/2015  Component Date Value Ref Range Status  . Sodium 04/02/2015 130* 135 - 145 mmol/L Final  . Potassium 04/02/2015 4.7  3.5 - 5.1  mmol/L Final  . Glucose, Bld 04/02/2015 116* 70 - 99 mg/dL Final  . HCT 04/02/2015 40.0  36.0 - 46.0 % Final  . Hemoglobin 04/02/2015 13.6  12.0 - 15.0 g/dL Final    Dg Chest Port 1 View  04/20/2015   CLINICAL DATA:  Shortness of breath and CHF.  EXAM: PORTABLE CHEST - 1 VIEW  COMPARISON:  None.  FINDINGS: Stable chronic cardiomegaly. Pulmonary vascularity is mildly increased. There is mild diffuse interstitial prominence bilaterally. Small focal opacity in the right upper lung field appears new compared to prior studies and most likely reflects atelectasis or focal airspace disease related edema.  No visible pleural effusion with portable technique.  IMPRESSION: Mild congestive heart failure suspected.  Small focal opacity right upper lung field favored to be due to atelectasis or early airspace disease secondary to edema. Suggest attention on follow-up.   Electronically Signed   By: Curlene Dolphin M.D.   On: 04/20/2015 11:34   Dg Chest Portable 1 View  04/19/2015   CLINICAL DATA:  Code ST-elevation myocardial infarction. Chest pain and shortness of breath. Initial encounter.  EXAM: PORTABLE CHEST - 1 VIEW  COMPARISON:  Chest radiograph performed 01/14/2015  FINDINGS: The lungs are well-aerated. Vascular congestion is noted, with mildly increased interstitial markings, raising concern for mild pulmonary edema. A small left pleural  effusion is suspected. No pneumothorax is seen.  The cardiomediastinal silhouette is mildly enlarged. No acute osseous abnormalities are seen.  IMPRESSION: Vascular congestion and mild cardiomegaly, with mildly increased interstitial markings, raising concern for mild pulmonary edema. Suspect small left pleural effusion.   Electronically Signed   By: Garald Balding M.D.   On: 04/19/2015 00:17     Assessment/Plan   ICD-9-CM ICD-10-CM   1. Chronic diastolic CHF (congestive heart failure) - improved 428.32 I50.32    428.0    2. Cardiomyopathy, ischemic-EF 30-35% - stable 414.8 I25.5   3. Essential hypertension - controlled 401.9 I10   4. ESRD (end stage renal disease) on HD 585.6 N18.6   5. PAD (peripheral artery disease) s/p right TMA 443.9 I73.9   6. Blindness with deafness 369.00 H54.0    V49.85 Z73.82    389.9 H91.90    --cont current meds  --f/u with HD as scheduled  --wound care as indicated  --f/u with specialists as scheduled  --she is a [facility] term resident  --will follow  Erandi Lemma S. Perlie Gold  [facility] [address] [place], [place] [address] [phone] Office (Wednesdays and Fridays 8 AM - 5 PM) [phone] Cell (Monday-Friday 8 AM - 5 PM)

## 2015-04-24 DIAGNOSIS — N186 End stage renal disease: Secondary | ICD-10-CM | POA: Diagnosis not present

## 2015-04-24 DIAGNOSIS — Z992 Dependence on renal dialysis: Secondary | ICD-10-CM | POA: Diagnosis not present

## 2015-04-24 DIAGNOSIS — E1129 Type 2 diabetes mellitus with other diabetic kidney complication: Secondary | ICD-10-CM | POA: Diagnosis not present

## 2015-04-27 NOTE — Progress Notes (Signed)
Patient ID: [name], female   DOB: 07/26/1956, 59 y.o.   MRN: [id number]  [facility]     No Known Allergies     Chief Complaint  Patient presents with  . Medical Management of Chronic Issues    HPI:  She is a Wild term resident of this facility being seen for the managemnet of her chronic illnesses. She is due for bilateral amputation of her toes due to her ulcerations. She is unable to fully participate in the hpi or ros. There are no nursing concerns today.   Past Medical History  Diagnosis Date  . Hyperlipidemia   . Hypertension     a. Has previously refused blood pressure meds.   . Deaf     Divorced from husband but lives with him. Has daughter but she does not care for her.  . Bilateral leg edema     a. Chronic.  Marland Kitchen Psychiatric disorder     She frequently exhibits paranoia and has been diagnosed with psychotic d/o NOS during hospital stay in the past. She is tangetial and perseverative during her visits. She apparently has had a bad experience with mental health in [place] in the past and refuses to discuss mental issues for fear that she will be sent back there. Her paranoia, communication issues, financial woes and lack of fam  . Fibroid uterus   . Anemia     Due to fibroids.  . Heart murmur   . Diabetes mellitus     a. Per PCP note 2012 (A1C 9.2) - pt unwilling to take meds and was educated on risk of uncontrolled DM.  b. A1C 5.9 in 11/2013.  Marland Kitchen Coronary artery disease   . Blind   . ESRD on hemodialysis     Mon, Wed, Fri  . CHF (congestive heart failure)   . MI (myocardial infarction) 11/2014    Past Surgical History  Procedure Laterality Date  . No past surgeries    . Insertion of dialysis catheter Right 01/02/2014    Procedure: INSERTION OF DIALYSIS CATHETER;  Surgeon: Rosetta Posner, MD;  Location: [facility];  Service: Vascular;  Laterality: Right;  . Av fistula placement Left 01/02/2014    Procedure: ARTERIOVENOUS (AV) FISTULA CREATION;  Surgeon: Rosetta Posner, MD;   Location: [facility];  Service: Vascular;  Laterality: Left;  . Amputation Bilateral 04/02/2015    Procedure: Amputation Bilateral Great Toes MTP Joint;  Surgeon: Newt Minion, MD;  Location: [facility];  Service: Orthopedics;  Laterality: Bilateral;    VITAL SIGNS BP 117/61 mmHg  Pulse 72  Ht _0  (1.727 m)  Wt 180 lb (81.647 kg)  BMI 27.38 kg/m2  SpO2 98%   Outpatient Encounter Prescriptions as of 03/25/2015  Medication Sig   . acetaminophen (TYLENOL) 325 MG tablet Take 650 mg by mouth 3 (three) times daily as needed.  . Amino Acids-Protein Hydrolys (FEEDING SUPPLEMENT, PRO-STAT SUGAR FREE 64,) LIQD Take 30 mLs by mouth 3 (three) times daily with meals.  Marland Kitchen amiodarone (PACERONE) 200 MG tablet Take 200 mg by mouth daily.  Marland Kitchen aspirin 81 MG tablet Take 81 mg by mouth daily. For hypertension  . atorvastatin (LIPITOR) 10 MG tablet Take 10 mg by mouth daily.  . calcium acetate (PHOSLO) 667 MG capsule Take 1 capsule (667 mg total) by mouth 3 (three) times daily with meals.  . clopidogrel (PLAVIX) 75 MG tablet Take 1 tablet (75 mg total) by mouth daily.  Mariane Baumgarten Sodium (DSS) 100 MG  CAPS Take 100 mg by mouth 2 (two) times daily. For constipation  . guaiFENesin (MUCINEX) 600 MG 12 hr tablet Take 1 tablet (600 mg total) by mouth 2 (two) times daily.  Marland Kitchen lactulose (CHRONULAC) 10 GM/15ML solution Take 30 mLs (20 g total) by mouth 2 (two) times daily as needed for moderate constipation.  Marland Kitchen levalbuterol (XOPENEX) 0.63 MG/3ML nebulizer solution Take 3 mLs (0.63 mg total) by nebulization every 2 (two) hours as needed for wheezing or shortness of breath.  . lisinopril (PRINIVIL,ZESTRIL) 2.5 MG tablet Take 1 tablet (2.5 mg total) by mouth daily.  . metoprolol succinate (TOPROL-XL) 50 MG 24 hr tablet Take 1 tablet (50 mg total) by mouth daily. Take with or immediately following a meal.  . multivitamin (RENA-VIT) TABS tablet Take 1 tablet by mouth at bedtime.  . nitroGLYCERIN (NITROSTAT) 0.4 MG SL tablet Place 1  tablet (0.4 mg total) under the tongue every 5 (five) minutes as needed for chest pain.     SIGNIFICANT DIAGNOSTIC EXAMS   11-20-14: chest x-ray: 1. Mild to moderate CHF.  11-20-14: 2-d echo: Left ventricle: The cavity size was normal. There was moderate concentric hypertrophy. Systolic function was moderately to severely reduced. The estimated ejection fraction was in the range of 30% to 35%. Dyskinesis and aneurysmal deformity of the mid-apicalanteroseptal, anterior, inferior, inferoseptal, and apical myocardium; consistent with infarction in the distribution of the left anterior descending coronary artery. Features are consistent with a pseudonormal left ventricular filling pattern, with concomitant abnormal relaxation and increased filling pressure - Mitral valve: Severely calcified annulus. - Left atrium: The atrium was moderately dilated. - Pulmonary arteries: Systolic pressure was mildly increased.  11-24-14: Myoview: 1. Fixed defects involving the apex, anterior lateral wall and the inferior wall. The apex and anterior lateral wall fixed defects aresuggestive for an infarct. No evidence for reversibility or ischemia. 2. Diffuse hypokinesia and minimal wall motion along the lateral wall. 3. Left ventricular ejection fraction is 35%. 4. High-risk stress test findings*.   LABS REVIEWED:   11-19-14: wbc 10.1; hgb .6; hct 29.1; mcv 93.6; plt 272; glucose 260; bun 83; creat 6.65; k+6.7; na++128; alk phos 142; ast 60; lat 68; t pro 8.6; albumin 3.5 11-21-14: wbc 8.2; hgb 9.0; hct 27.7; mcv 97.2; plt 251; glucose 166; bun 45; creat 4.77; k+5.0; na++134; phos 5.2; albumin 3.2; hgb a1c 6.0 11-24-14: wbc 7.2; hgb 8.6; hct 27.2; mcv 96.5; plt 233; glucose 99; bun 40; creat 4.51; k+4.4; na++132; phos 6.0; albumin 3.0 01-08-15: hgb a1c 5.2  02-13-15; chol 165; ldl 106; trig 46; hdl 50      ROS Unable to perform ROS    Physical Exam Constitutional: She appears well-developed and  well-nourished. No distress.  Over weight   Neck: Neck supple. No JVD present. No thyromegaly present.  Cardiovascular: Normal rate, regular rhythm and intact distal pulses.   Respiratory: Effort normal and breath sounds normal. No respiratory distress. She has no wheezes.  GI: Soft. Bowel sounds are normal. She exhibits no distension. There is no tenderness.  Musculoskeletal: She exhibits no edema.  Is able to move all extremities   Neurological: She is alert.  Skin: Skin is warm and dry. She is not diaphoretic.  A/v fistula left upper arm +thril +bruit  Left foot ulcer; dressing intact          ASSESSMENT/ PLAN:  1. ESRD: is on hemodialysis three days per week. Will continue phoslo 667 mg three times daily1200 cc fluid restriction  will monitor  her status.   2. Dyslipidemia: will continue lipitor 80 mg daily ldl 106   3. Hypertension: will continue clonidine 0.1 mg patch weekly; hydralazine 100 mg every 8 hours; lisinopril 2.5 mg daily and toprol xl 50 mg daily   4. Status post Anterior wall MI: no chest pain present; will continue imdur 60 mg daily; asa 81 mg daily plavix 75 mg daily and ntg prn will monitor   5. Ischemic cardiomyopathy: ef is 30-35%; she is presently stable will continue hydralazine 100 mg three times daily; will continue to monitor weight at dialysis will monitor her status   6. Anemia due to ckd: will continue aranesp at dialysis as directed   7. Psychosis: she is emotionally stable is presently not on medications; will not make changes    8. Constipation: will continue colace twice daily and chronulac 30 cc twice daily as needed   9. Acute respiratory failure: she is presently stable is on room air; will continue xopenex every 2 hours as needed   10. PAD: no change in status; will continue asa 81 mg daily and plavix 75 mg daily surgery is pending.     Ok Edwards NP [facility]  Contact [phone] Monday through Friday 8am- 5pm    After hours call [phone]

## 2015-05-13 ENCOUNTER — Other Ambulatory Visit (HOSPITAL_COMMUNITY): Payer: Self-pay | Admitting: Orthopedic Surgery

## 2015-05-13 ENCOUNTER — Encounter (HOSPITAL_COMMUNITY): Payer: Self-pay | Admitting: *Deleted

## 2015-05-13 NOTE — Progress Notes (Signed)
  Dr Stevphen Rochester notified of patients cardiac history.  Dr Oletta Lamas said they would evaluate on arrival.

## 2015-05-13 NOTE — Progress Notes (Signed)
I spoke with Ms Burbach' evening nurse for today, [place] and patient's son, Viona Gilmore. [name]. [place] and I went over what medications she is currently taking and is going to refax medication list to pharmacy now. Ms. Hain communicates with a touch interpreter, Joanna Hews has requested interpreter.

## 2015-05-14 ENCOUNTER — Ambulatory Visit (HOSPITAL_COMMUNITY): Payer: Medicare Other | Admitting: Anesthesiology

## 2015-05-14 ENCOUNTER — Ambulatory Visit (HOSPITAL_COMMUNITY)
Admission: RE | Admit: 2015-05-14 | Discharge: 2015-05-14 | Disposition: A | Discharge status: Skilled Nursing Facility | Payer: Medicare Other | Source: Ambulatory Visit | Attending: Orthopedic Surgery | Admitting: Orthopedic Surgery

## 2015-05-14 ENCOUNTER — Encounter (HOSPITAL_COMMUNITY)
Admission: RE | Disposition: A | Discharge status: Skilled Nursing Facility | Payer: Self-pay | Source: Ambulatory Visit | Attending: Orthopedic Surgery

## 2015-05-14 ENCOUNTER — Encounter (HOSPITAL_COMMUNITY): Payer: Self-pay | Admitting: *Deleted

## 2015-05-14 DIAGNOSIS — I70245 Atherosclerosis of native arteries of left leg with ulceration of other part of foot: Secondary | ICD-10-CM | POA: Diagnosis present

## 2015-05-14 DIAGNOSIS — F4320 Adjustment disorder, unspecified: Secondary | ICD-10-CM | POA: Diagnosis not present

## 2015-05-14 DIAGNOSIS — E785 Hyperlipidemia, unspecified: Secondary | ICD-10-CM | POA: Insufficient documentation

## 2015-05-14 DIAGNOSIS — M25562 Pain in left knee: Secondary | ICD-10-CM | POA: Diagnosis not present

## 2015-05-14 DIAGNOSIS — Y92122 Bedroom in nursing home as the place of occurrence of the external cause: Secondary | ICD-10-CM

## 2015-05-14 DIAGNOSIS — S82002D Unspecified fracture of left patella, subsequent encounter for closed fracture with routine healing: Secondary | ICD-10-CM | POA: Diagnosis not present

## 2015-05-14 DIAGNOSIS — M868X7 Other osteomyelitis, ankle and foot: Secondary | ICD-10-CM

## 2015-05-14 DIAGNOSIS — Z79899 Other long term (current) drug therapy: Secondary | ICD-10-CM | POA: Diagnosis not present

## 2015-05-14 DIAGNOSIS — F29 Unspecified psychosis not due to a substance or known physiological condition: Secondary | ICD-10-CM | POA: Diagnosis not present

## 2015-05-14 DIAGNOSIS — N2581 Secondary hyperparathyroidism of renal origin: Secondary | ICD-10-CM | POA: Diagnosis present

## 2015-05-14 DIAGNOSIS — W06XXXA Fall from bed, initial encounter: Secondary | ICD-10-CM | POA: Diagnosis present

## 2015-05-14 DIAGNOSIS — L02612 Cutaneous abscess of left foot: Secondary | ICD-10-CM

## 2015-05-14 DIAGNOSIS — H540 Blindness, both eyes: Secondary | ICD-10-CM | POA: Diagnosis present

## 2015-05-14 DIAGNOSIS — S82092A Other fracture of left patella, initial encounter for closed fracture: Secondary | ICD-10-CM | POA: Diagnosis not present

## 2015-05-14 DIAGNOSIS — D631 Anemia in chronic kidney disease: Secondary | ICD-10-CM | POA: Diagnosis present

## 2015-05-14 DIAGNOSIS — I10 Essential (primary) hypertension: Secondary | ICD-10-CM | POA: Diagnosis not present

## 2015-05-14 DIAGNOSIS — I120 Hypertensive chronic kidney disease with stage 5 chronic kidney disease or end stage renal disease: Secondary | ICD-10-CM | POA: Diagnosis not present

## 2015-05-14 DIAGNOSIS — Z992 Dependence on renal dialysis: Secondary | ICD-10-CM | POA: Diagnosis not present

## 2015-05-14 DIAGNOSIS — S82009A Unspecified fracture of unspecified patella, initial encounter for closed fracture: Secondary | ICD-10-CM | POA: Diagnosis not present

## 2015-05-14 DIAGNOSIS — S82002B Unspecified fracture of left patella, initial encounter for open fracture type I or II: Secondary | ICD-10-CM | POA: Diagnosis not present

## 2015-05-14 DIAGNOSIS — F419 Anxiety disorder, unspecified: Secondary | ICD-10-CM | POA: Diagnosis not present

## 2015-05-14 DIAGNOSIS — E1122 Type 2 diabetes mellitus with diabetic chronic kidney disease: Secondary | ICD-10-CM | POA: Diagnosis present

## 2015-05-14 DIAGNOSIS — L03032 Cellulitis of left toe: Secondary | ICD-10-CM | POA: Diagnosis not present

## 2015-05-14 DIAGNOSIS — I5022 Chronic systolic (congestive) heart failure: Secondary | ICD-10-CM | POA: Diagnosis not present

## 2015-05-14 DIAGNOSIS — S82042A Displaced comminuted fracture of left patella, initial encounter for closed fracture: Principal | ICD-10-CM | POA: Diagnosis present

## 2015-05-14 DIAGNOSIS — Z7951 Long term (current) use of inhaled steroids: Secondary | ICD-10-CM

## 2015-05-14 DIAGNOSIS — I255 Ischemic cardiomyopathy: Secondary | ICD-10-CM | POA: Diagnosis present

## 2015-05-14 DIAGNOSIS — I252 Old myocardial infarction: Secondary | ICD-10-CM | POA: Insufficient documentation

## 2015-05-14 DIAGNOSIS — S82001A Unspecified fracture of right patella, initial encounter for closed fracture: Secondary | ICD-10-CM | POA: Diagnosis not present

## 2015-05-14 DIAGNOSIS — E1140 Type 2 diabetes mellitus with diabetic neuropathy, unspecified: Secondary | ICD-10-CM | POA: Insufficient documentation

## 2015-05-14 DIAGNOSIS — M79672 Pain in left foot: Secondary | ICD-10-CM | POA: Diagnosis not present

## 2015-05-14 DIAGNOSIS — I519 Heart disease, unspecified: Secondary | ICD-10-CM | POA: Diagnosis not present

## 2015-05-14 DIAGNOSIS — R627 Adult failure to thrive: Secondary | ICD-10-CM | POA: Diagnosis present

## 2015-05-14 DIAGNOSIS — S82002A Unspecified fracture of left patella, initial encounter for closed fracture: Secondary | ICD-10-CM | POA: Diagnosis not present

## 2015-05-14 DIAGNOSIS — I2510 Atherosclerotic heart disease of native coronary artery without angina pectoris: Secondary | ICD-10-CM

## 2015-05-14 DIAGNOSIS — E1322 Other specified diabetes mellitus with diabetic chronic kidney disease: Secondary | ICD-10-CM | POA: Diagnosis not present

## 2015-05-14 DIAGNOSIS — M86272 Subacute osteomyelitis, left ankle and foot: Secondary | ICD-10-CM | POA: Diagnosis not present

## 2015-05-14 DIAGNOSIS — Z89431 Acquired absence of right foot: Secondary | ICD-10-CM | POA: Diagnosis not present

## 2015-05-14 DIAGNOSIS — M19042 Primary osteoarthritis, left hand: Secondary | ICD-10-CM | POA: Diagnosis not present

## 2015-05-14 DIAGNOSIS — Z7902 Long term (current) use of antithrombotics/antiplatelets: Secondary | ICD-10-CM

## 2015-05-14 DIAGNOSIS — Z7982 Long term (current) use of aspirin: Secondary | ICD-10-CM | POA: Diagnosis not present

## 2015-05-14 DIAGNOSIS — N186 End stage renal disease: Secondary | ICD-10-CM | POA: Diagnosis not present

## 2015-05-14 DIAGNOSIS — E871 Hypo-osmolality and hyponatremia: Secondary | ICD-10-CM | POA: Diagnosis present

## 2015-05-14 DIAGNOSIS — K5090 Crohn's disease, unspecified, without complications: Secondary | ICD-10-CM | POA: Diagnosis present

## 2015-05-14 DIAGNOSIS — L03116 Cellulitis of left lower limb: Secondary | ICD-10-CM | POA: Diagnosis present

## 2015-05-14 DIAGNOSIS — E119 Type 2 diabetes mellitus without complications: Secondary | ICD-10-CM | POA: Insufficient documentation

## 2015-05-14 DIAGNOSIS — H548 Legal blindness, as defined in USA: Secondary | ICD-10-CM | POA: Diagnosis not present

## 2015-05-14 DIAGNOSIS — S82032A Displaced transverse fracture of left patella, initial encounter for closed fracture: Secondary | ICD-10-CM | POA: Diagnosis not present

## 2015-05-14 DIAGNOSIS — M25552 Pain in left hip: Secondary | ICD-10-CM | POA: Diagnosis not present

## 2015-05-14 DIAGNOSIS — H9193 Unspecified hearing loss, bilateral: Secondary | ICD-10-CM | POA: Diagnosis not present

## 2015-05-14 DIAGNOSIS — I509 Heart failure, unspecified: Secondary | ICD-10-CM

## 2015-05-14 DIAGNOSIS — H913 Deaf nonspeaking, not elsewhere classified: Secondary | ICD-10-CM | POA: Diagnosis present

## 2015-05-14 DIAGNOSIS — G8918 Other acute postprocedural pain: Secondary | ICD-10-CM | POA: Diagnosis not present

## 2015-05-14 DIAGNOSIS — N189 Chronic kidney disease, unspecified: Secondary | ICD-10-CM | POA: Diagnosis not present

## 2015-05-14 HISTORY — PX: AMPUTATION: SHX166

## 2015-05-14 LAB — APTT: aPTT: 29 seconds (ref 24–37)

## 2015-05-14 LAB — PROTIME-INR
INR: 1.18 (ref 0.00–1.49)
Prothrombin Time: 15.2 seconds (ref 11.6–15.2)

## 2015-05-14 LAB — GLUCOSE, CAPILLARY: Glucose-Capillary: 78 mg/dL (ref 65–99)

## 2015-05-14 SURGERY — AMPUTATION, FOOT, RAY
Anesthesia: Regional | Laterality: Left

## 2015-05-14 MED ORDER — MIDAZOLAM HCL 5 MG/5ML IJ SOLN
INTRAMUSCULAR | Status: DC | PRN
  Administered 2015-05-14 (×2): 1 mg via INTRAVENOUS

## 2015-05-14 MED ORDER — ROPIVACAINE HCL 5 MG/ML IJ SOLN
INTRAMUSCULAR | Status: DC | PRN
  Administered 2015-05-14: 30 mg via PERINEURAL

## 2015-05-14 MED ORDER — PROPOFOL 10 MG/ML IV BOLUS
INTRAVENOUS | Status: AC
  Filled 2015-05-14: qty 20

## 2015-05-14 MED ORDER — HYDROMORPHONE HCL 1 MG/ML IJ SOLN: 0.2500 mg | INTRAMUSCULAR | Status: DC | PRN

## 2015-05-14 MED ORDER — MIDAZOLAM HCL 2 MG/2ML IJ SOLN
INTRAMUSCULAR | Status: AC
  Filled 2015-05-14: qty 2

## 2015-05-14 MED ORDER — MIDAZOLAM HCL 2 MG/2ML IJ SOLN
1.0000 mg | INTRAMUSCULAR | Status: DC | PRN
  Administered 2015-05-14: 0.5 mg via INTRAVENOUS

## 2015-05-14 MED ORDER — CEFAZOLIN SODIUM-DEXTROSE 2-3 GM-% IV SOLR
2.0000 g | INTRAVENOUS | Status: AC
  Administered 2015-05-14: 2 g via INTRAVENOUS
  Filled 2015-05-14: qty 50

## 2015-05-14 MED ORDER — PROPOFOL 10 MG/ML IV BOLUS
INTRAVENOUS | Status: DC | PRN
  Administered 2015-05-14 (×2): 100 mg via INTRAVENOUS

## 2015-05-14 MED ORDER — FENTANYL CITRATE (PF) 100 MCG/2ML IJ SOLN
INTRAMUSCULAR | Status: AC
  Filled 2015-05-14: qty 2

## 2015-05-14 MED ORDER — FENTANYL CITRATE (PF) 250 MCG/5ML IJ SOLN
INTRAMUSCULAR | Status: AC
  Filled 2015-05-14: qty 5

## 2015-05-14 MED ORDER — FENTANYL CITRATE (PF) 100 MCG/2ML IJ SOLN
INTRAMUSCULAR | Status: DC | PRN
  Administered 2015-05-14: 50 ug via INTRAVENOUS

## 2015-05-14 MED ORDER — SODIUM CHLORIDE 0.9 % IV SOLN
INTRAVENOUS | Status: DC | PRN
  Administered 2015-05-14: 14:00:00 via INTRAVENOUS

## 2015-05-14 MED ORDER — FENTANYL CITRATE (PF) 100 MCG/2ML IJ SOLN
INTRAMUSCULAR | Status: DC
Start: 2015-05-14 — End: 2015-05-14
  Filled 2015-05-14: qty 2

## 2015-05-14 MED ORDER — 0.9 % SODIUM CHLORIDE (POUR BTL) OPTIME
TOPICAL | Status: DC | PRN
  Administered 2015-05-14: 1000 mL

## 2015-05-14 MED ORDER — FENTANYL CITRATE (PF) 100 MCG/2ML IJ SOLN
50.0000 ug | INTRAMUSCULAR | Status: AC | PRN
  Administered 2015-05-14 (×3): 50 ug via INTRAVENOUS

## 2015-05-14 SURGICAL SUPPLY — 31 items
BLADE SAW SGTL MED 73X18.5 STR (BLADE) IMPLANT
BNDG COHESIVE 4X5 TAN STRL (GAUZE/BANDAGES/DRESSINGS) ×2 IMPLANT
BNDG COHESIVE 6X5 TAN STRL LF (GAUZE/BANDAGES/DRESSINGS) ×2 IMPLANT
BNDG GAUZE ELAST 4 BULKY (GAUZE/BANDAGES/DRESSINGS) ×6 IMPLANT
COVER SURGICAL LIGHT HANDLE (MISCELLANEOUS) ×4 IMPLANT
DRAPE U-SHAPE 47X51 STRL (DRAPES) ×4 IMPLANT
DRSG ADAPTIC 3X8 NADH LF (GAUZE/BANDAGES/DRESSINGS) ×2 IMPLANT
DRSG PAD ABDOMINAL 8X10 ST (GAUZE/BANDAGES/DRESSINGS) ×4 IMPLANT
DURAPREP 26ML APPLICATOR (WOUND CARE) ×2 IMPLANT
ELECT REM PT RETURN 9FT ADLT (ELECTROSURGICAL) ×2
ELECTRODE REM PT RTRN 9FT ADLT (ELECTROSURGICAL) ×1 IMPLANT
GAUZE SPONGE 4X4 12PLY STRL (GAUZE/BANDAGES/DRESSINGS) ×2 IMPLANT
GLOVE BIOGEL PI IND STRL 9 (GLOVE) ×1 IMPLANT
GLOVE BIOGEL PI INDICATOR 9 (GLOVE) ×1
GLOVE SURG ORTHO 9.0 STRL STRW (GLOVE) ×2 IMPLANT
GOWN STRL REUS W/ TWL LRG LVL3 (GOWN DISPOSABLE) ×1 IMPLANT
GOWN STRL REUS W/ TWL XL LVL3 (GOWN DISPOSABLE) ×2 IMPLANT
GOWN STRL REUS W/TWL LRG LVL3 (GOWN DISPOSABLE) ×2
GOWN STRL REUS W/TWL XL LVL3 (GOWN DISPOSABLE) ×4
KIT BASIN OR (CUSTOM PROCEDURE TRAY) ×2 IMPLANT
KIT ROOM TURNOVER OR (KITS) ×2 IMPLANT
NS IRRIG 1000ML POUR BTL (IV SOLUTION) ×2 IMPLANT
PACK ORTHO EXTREMITY (CUSTOM PROCEDURE TRAY) ×2 IMPLANT
PAD ARMBOARD 7.5X6 YLW CONV (MISCELLANEOUS) ×4 IMPLANT
SPONGE LAP 18X18 X RAY DECT (DISPOSABLE) ×2 IMPLANT
STOCKINETTE IMPERVIOUS LG (DRAPES) IMPLANT
SUT ETHILON 2 0 PSLX (SUTURE) ×4 IMPLANT
TOWEL OR 17X24 6PK STRL BLUE (TOWEL DISPOSABLE) ×2 IMPLANT
TOWEL OR 17X26 10 PK STRL BLUE (TOWEL DISPOSABLE) ×2 IMPLANT
UNDERPAD 30X30 INCONTINENT (UNDERPADS AND DIAPERS) ×2 IMPLANT
WATER STERILE IRR 1000ML POUR (IV SOLUTION) ×2 IMPLANT

## 2015-05-14 NOTE — H&P (Signed)
[name] is an 59 y.o. female.   Chief Complaint: Osteomized ulceration left forefoot HPI: Patient is a 59 year old woman with osteomyelitis ulceration of the right forefoot. She has diabetic insensate neuropathy with end-stage renal disease on hemodialysis.  Past Medical History  Diagnosis Date  . Hyperlipidemia   . Hypertension     a. Has previously refused blood pressure meds.   . Deaf     Divorced from husband but lives with him. Has daughter but she does not care for her.  . Bilateral leg edema     a. Chronic.  Marland Kitchen Psychiatric disorder     She frequently exhibits paranoia and has been diagnosed with psychotic d/o NOS during hospital stay in the past. She is tangetial and perseverative during her visits. She apparently has had a bad experience with mental health in [place] in the past and refuses to discuss mental issues for fear that she will be sent back there. Her paranoia, communication issues, financial woes and lack of fam  . Fibroid uterus   . Anemia     Due to fibroids.  . Heart murmur   . Diabetes mellitus     a. Per PCP note 2012 (A1C 9.2) - pt unwilling to take meds and was educated on risk of uncontrolled DM.  b. A1C 5.9 in 11/2013.  Marland Kitchen Coronary artery disease   . Blind   . ESRD on hemodialysis     Mon, Wed, Fri  . CHF (congestive heart failure)   . MI (myocardial infarction) 11/2014    Past Surgical History  Procedure Laterality Date  . No past surgeries    . Insertion of dialysis catheter Right 01/02/2014    Procedure: INSERTION OF DIALYSIS CATHETER;  Surgeon: Rosetta Posner, MD;  Location: [facility];  Service: Vascular;  Laterality: Right;  . Av fistula placement Left 01/02/2014    Procedure: ARTERIOVENOUS (AV) FISTULA CREATION;  Surgeon: Rosetta Posner, MD;  Location: [facility];  Service: Vascular;  Laterality: Left;  . Amputation Bilateral 04/02/2015    Procedure: Amputation Bilateral Great Toes MTP Joint;  Surgeon: Newt Minion, MD;  Location: [facility];  Service: Orthopedics;   Laterality: Bilateral;    Family History  Problem Relation Age of Onset  . Other Father     Drowned from fishing?  . Heart disease     Social History:  reports that she has never smoked. She does not have any smokeless tobacco history on file. She reports that she does not drink alcohol or use illicit drugs.  Allergies: No Known Allergies  No prescriptions prior to admission    No results found for this or any previous visit (from the past 48 hour(s)). No results found.  Review of Systems  All other systems reviewed and are negative.   There were no vitals taken for this visit. Physical Exam  Ulceration osteomyelitis cellulitis left forefoot Assessment/Plan Assessment: Ulceration osteomyelitis cellulitis left forefoot.  Plan: We'll plan for left transmetatarsal amputation. Risks and benefits were discussed including risk of the wound not healing. Patient states she understands and wishes to proceed at this time.  Jyaire Koudelka V 05/14/2015, 6:46 AM

## 2015-05-14 NOTE — Anesthesia Preprocedure Evaluation (Addendum)
Anesthesia Evaluation  Patient identified by MRN, date of birth, ID band Patient awake  General Assessment Comment:Pt blind and deaf  Reviewed: Allergy & Precautions, H&P , NPO status , Patient's Chart, lab work & pertinent test results, reviewed documented beta blocker date and time   Airway Mallampati: II  TM Distance: >3 FB Neck ROM: Full    Dental no notable dental hx. (+) Teeth Intact, Dental Advisory Given   Pulmonary neg pulmonary ROS,  breath sounds clear to auscultation  Pulmonary exam normal       Cardiovascular hypertension, Pt. on medications and Pt. on home beta blockers + CAD, + Past MI, + Peripheral Vascular Disease and +CHF + dysrhythmias Rhythm:Regular Rate:Normal     Neuro/Psych negative neurological ROS     GI/Hepatic negative GI ROS, Neg liver ROS,   Endo/Other  diabetes  Renal/GU ESRF and DialysisRenal disease  negative genitourinary   Musculoskeletal   Abdominal   Peds  Hematology negative hematology ROS (+)   Anesthesia Other Findings   Reproductive/Obstetrics negative OB ROS                           Anesthesia Physical Anesthesia Plan  ASA: IV  Anesthesia Plan: General and Regional   Post-op Pain Management:    Induction: Intravenous  Airway Management Planned: LMA  Additional Equipment:   Intra-op Plan:   Post-operative Plan: Extubation in OR  Informed Consent: I have reviewed the patients History and Physical, chart, labs and discussed the procedure including the risks, benefits and alternatives for the proposed anesthesia with the patient or authorized representative who has indicated his/her understanding and acceptance.   Dental advisory given  Plan Discussed with: CRNA  Anesthesia Plan Comments:        Anesthesia Quick Evaluation

## 2015-05-14 NOTE — Anesthesia Procedure Notes (Addendum)
Anesthesia Regional Block:  Popliteal block  Pre-Anesthetic Checklist: ,, timeout performed, Correct Patient, Correct Site, Correct Laterality, Correct Procedure, Correct Position, site marked, Risks and benefits discussed,  Surgical consent,  Pre-op evaluation,  At surgeon's request and post-op pain management  Laterality: Left  Prep: chloraprep       Needles:  Injection technique: Single-shot  Needle Type: Echogenic Stimulator Needle          Additional Needles:  Procedures: ultrasound guided (picture in chart) and nerve stimulator Popliteal block  Nerve Stimulator or Paresthesia:  Response: plantar flexion, 0.45 mA,   Additional Responses:   Narrative:  Start time: 05/14/2015 1:38 PM End time: 05/14/2015 1:48 PM Injection made incrementally with aspirations every 5 mL.  Performed by: Personally  Anesthesiologist: Duane Boston  Additional Notes: A functioning IV was confirmed and monitors were applied.  Sterile prep and drape, hand hygiene and sterile gloves were used.  Negative aspiration and test dose prior to incremental administration of local anesthetic. The patient tolerated the procedure well.Ultrasound  guidance: relevant anatomy identified, needle position confirmed, local anesthetic spread visualized around nerve(s), vascular puncture avoided.  Image printed for medical record.    Procedure Name: LMA Insertion Date/Time: 05/14/2015 2:16 PM Performed by: Susa Loffler Pre-anesthesia Checklist: Patient identified, Timeout performed, Emergency Drugs available, Suction available and Patient being monitored Patient Re-evaluated:Patient Re-evaluated prior to inductionOxygen Delivery Method: Circle system utilized Preoxygenation: Pre-oxygenation with 100% oxygen LMA: LMA inserted LMA Size: 4.0 Number of attempts: 1 Placement Confirmation: positive ETCO2 and breath sounds checked- equal and bilateral Tube secured with: Tape Dental Injury: Teeth and  Oropharynx as per pre-operative assessment

## 2015-05-14 NOTE — Anesthesia Postprocedure Evaluation (Signed)
Anesthesia Post Note  Patient: [name]  Procedure(s) Performed: Procedure(s) (LRB): Left Transmetatarsal Amputation (Left)  Anesthesia type: general  Patient location: PACU  Post pain: Pain level controlled  Post assessment: Patient's Cardiovascular Status Stable  Last Vitals:  Filed Vitals:   05/14/15 1500  BP: 135/50  Pulse:   Temp:   Resp:     Post vital signs: Reviewed and stable  Level of consciousness: sedated  Complications: No apparent anesthesia complications

## 2015-05-14 NOTE — Op Note (Signed)
05/14/2015  2:28 PM  PATIENT:  [name]    PRE-OPERATIVE DIAGNOSIS:  Left Foot Abscess  POST-OPERATIVE DIAGNOSIS:  Same  PROCEDURE:  Left Transmetatarsal Amputation  SURGEON:  Newt Minion, MD  PHYSICIAN ASSISTANT:None ANESTHESIA:   General  PREOPERATIVE INDICATIONS:  [name] is a  59 y.o. female with a diagnosis of Left Foot Abscess who failed conservative measures and elected for surgical management.    The risks benefits and alternatives were discussed with the patient preoperatively including but not limited to the risks of infection, bleeding, nerve injury, cardiopulmonary complications, the need for revision surgery, among others, and the patient was willing to proceed.  OPERATIVE IMPLANTS: None  OPERATIVE FINDINGS: Calcified vessels with  petechial bleeding  OPERATIVE PROCEDURE: Patient was brought to the operating room and underwent a general anesthetic. After adequate levels anesthesia were obtained patient's left lower extremity was prepped using DuraPrep draped into a sterile field. A timeout was called. A fishmouth incision was made proximal to the ulcerative area. A transmetatarsal amputation was performed with an oscillating saw. The wound was irrigated with normal saline hemostasis was obtained. There is a significant calcification of her vessels. Incision was closed using 2-0 nylon. A sterile compressive dressing was applied. Patient was extubated taken to the PACU in stable condition. Anticipate discharge back to skilled nursing.

## 2015-05-14 NOTE — Transfer of Care (Signed)
Immediate Anesthesia Transfer of Care Note  Patient: [name]  Procedure(s) Performed: Procedure(s): Left Transmetatarsal Amputation (Left)  Patient Location: PACU  Anesthesia Type:General  Level of Consciousness: awake, alert  and oriented  Airway & Oxygen Therapy: Patient Spontanous Breathing and Patient connected to nasal cannula oxygen  Post-op Assessment: Report given to RN and Post -op Vital signs reviewed and stable  Post vital signs: Reviewed and stable  Last Vitals:  Filed Vitals:   05/14/15 1349  BP: 133/58  Pulse:   Temp:   Resp:     Complications: No apparent anesthesia complications

## 2015-05-16 ENCOUNTER — Other Ambulatory Visit: Payer: Self-pay

## 2015-05-16 ENCOUNTER — Encounter (HOSPITAL_COMMUNITY): Payer: Medicare Other | Admitting: Anesthesiology

## 2015-05-16 ENCOUNTER — Emergency Department (HOSPITAL_COMMUNITY): Payer: Medicare Other

## 2015-05-16 ENCOUNTER — Encounter (HOSPITAL_COMMUNITY): Payer: Self-pay

## 2015-05-16 ENCOUNTER — Inpatient Hospital Stay (HOSPITAL_COMMUNITY)
Admission: EM | Admit: 2015-05-16 | Discharge: 2015-05-18 | DRG: 474 | Disposition: A | Discharge status: Skilled Nursing Facility | Payer: Medicare Other | Attending: Internal Medicine | Admitting: Internal Medicine

## 2015-05-16 ENCOUNTER — Encounter (HOSPITAL_COMMUNITY)
Admission: EM | Disposition: A | Discharge status: Skilled Nursing Facility | Payer: Self-pay | Source: Home / Self Care | Attending: Internal Medicine

## 2015-05-16 ENCOUNTER — Encounter (HOSPITAL_COMMUNITY): Payer: Self-pay | Admitting: Anesthesiology

## 2015-05-16 DIAGNOSIS — M25562 Pain in left knee: Secondary | ICD-10-CM | POA: Diagnosis present

## 2015-05-16 DIAGNOSIS — F4320 Adjustment disorder, unspecified: Secondary | ICD-10-CM | POA: Diagnosis not present

## 2015-05-16 DIAGNOSIS — L02612 Cutaneous abscess of left foot: Secondary | ICD-10-CM | POA: Diagnosis present

## 2015-05-16 DIAGNOSIS — H913 Deaf nonspeaking, not elsewhere classified: Secondary | ICD-10-CM

## 2015-05-16 DIAGNOSIS — Z992 Dependence on renal dialysis: Secondary | ICD-10-CM | POA: Diagnosis not present

## 2015-05-16 DIAGNOSIS — H540 Blindness, both eyes: Secondary | ICD-10-CM | POA: Diagnosis present

## 2015-05-16 DIAGNOSIS — S82001A Unspecified fracture of right patella, initial encounter for closed fracture: Secondary | ICD-10-CM | POA: Diagnosis not present

## 2015-05-16 DIAGNOSIS — S82009A Unspecified fracture of unspecified patella, initial encounter for closed fracture: Secondary | ICD-10-CM | POA: Diagnosis not present

## 2015-05-16 DIAGNOSIS — D631 Anemia in chronic kidney disease: Secondary | ICD-10-CM | POA: Diagnosis present

## 2015-05-16 DIAGNOSIS — I255 Ischemic cardiomyopathy: Secondary | ICD-10-CM | POA: Diagnosis not present

## 2015-05-16 DIAGNOSIS — R627 Adult failure to thrive: Secondary | ICD-10-CM | POA: Diagnosis present

## 2015-05-16 DIAGNOSIS — E1140 Type 2 diabetes mellitus with diabetic neuropathy, unspecified: Secondary | ICD-10-CM | POA: Diagnosis present

## 2015-05-16 DIAGNOSIS — Z7902 Long term (current) use of antithrombotics/antiplatelets: Secondary | ICD-10-CM | POA: Diagnosis not present

## 2015-05-16 DIAGNOSIS — S82032A Displaced transverse fracture of left patella, initial encounter for closed fracture: Secondary | ICD-10-CM | POA: Diagnosis not present

## 2015-05-16 DIAGNOSIS — M79672 Pain in left foot: Secondary | ICD-10-CM | POA: Diagnosis not present

## 2015-05-16 DIAGNOSIS — I10 Essential (primary) hypertension: Secondary | ICD-10-CM | POA: Diagnosis not present

## 2015-05-16 DIAGNOSIS — I509 Heart failure, unspecified: Secondary | ICD-10-CM | POA: Diagnosis not present

## 2015-05-16 DIAGNOSIS — I252 Old myocardial infarction: Secondary | ICD-10-CM | POA: Diagnosis not present

## 2015-05-16 DIAGNOSIS — S82042A Displaced comminuted fracture of left patella, initial encounter for closed fracture: Secondary | ICD-10-CM | POA: Diagnosis present

## 2015-05-16 DIAGNOSIS — E871 Hypo-osmolality and hyponatremia: Secondary | ICD-10-CM | POA: Diagnosis present

## 2015-05-16 DIAGNOSIS — I110 Hypertensive heart disease with heart failure: Secondary | ICD-10-CM | POA: Diagnosis present

## 2015-05-16 DIAGNOSIS — S82092A Other fracture of left patella, initial encounter for closed fracture: Secondary | ICD-10-CM | POA: Diagnosis not present

## 2015-05-16 DIAGNOSIS — W06XXXA Fall from bed, initial encounter: Secondary | ICD-10-CM | POA: Diagnosis present

## 2015-05-16 DIAGNOSIS — Y92129 Unspecified place in nursing home as the place of occurrence of the external cause: Secondary | ICD-10-CM

## 2015-05-16 DIAGNOSIS — I2510 Atherosclerotic heart disease of native coronary artery without angina pectoris: Secondary | ICD-10-CM | POA: Diagnosis present

## 2015-05-16 DIAGNOSIS — E1322 Other specified diabetes mellitus with diabetic chronic kidney disease: Secondary | ICD-10-CM | POA: Diagnosis not present

## 2015-05-16 DIAGNOSIS — N186 End stage renal disease: Secondary | ICD-10-CM | POA: Diagnosis not present

## 2015-05-16 DIAGNOSIS — I214 Non-ST elevation (NSTEMI) myocardial infarction: Secondary | ICD-10-CM | POA: Diagnosis present

## 2015-05-16 DIAGNOSIS — E785 Hyperlipidemia, unspecified: Secondary | ICD-10-CM | POA: Diagnosis present

## 2015-05-16 DIAGNOSIS — M25552 Pain in left hip: Secondary | ICD-10-CM | POA: Diagnosis not present

## 2015-05-16 DIAGNOSIS — F419 Anxiety disorder, unspecified: Secondary | ICD-10-CM | POA: Diagnosis not present

## 2015-05-16 DIAGNOSIS — I5189 Other ill-defined heart diseases: Secondary | ICD-10-CM | POA: Diagnosis present

## 2015-05-16 DIAGNOSIS — E119 Type 2 diabetes mellitus without complications: Secondary | ICD-10-CM | POA: Diagnosis not present

## 2015-05-16 DIAGNOSIS — S82002B Unspecified fracture of left patella, initial encounter for open fracture type I or II: Secondary | ICD-10-CM | POA: Diagnosis not present

## 2015-05-16 DIAGNOSIS — M19042 Primary osteoarthritis, left hand: Secondary | ICD-10-CM | POA: Diagnosis not present

## 2015-05-16 DIAGNOSIS — Z89431 Acquired absence of right foot: Secondary | ICD-10-CM | POA: Diagnosis not present

## 2015-05-16 DIAGNOSIS — S82002A Unspecified fracture of left patella, initial encounter for closed fracture: Secondary | ICD-10-CM

## 2015-05-16 DIAGNOSIS — Z7951 Long term (current) use of inhaled steroids: Secondary | ICD-10-CM | POA: Diagnosis not present

## 2015-05-16 DIAGNOSIS — Z79899 Other long term (current) drug therapy: Secondary | ICD-10-CM | POA: Diagnosis not present

## 2015-05-16 DIAGNOSIS — W19XXXA Unspecified fall, initial encounter: Secondary | ICD-10-CM

## 2015-05-16 DIAGNOSIS — Y92122 Bedroom in nursing home as the place of occurrence of the external cause: Secondary | ICD-10-CM | POA: Diagnosis not present

## 2015-05-16 DIAGNOSIS — H9193 Unspecified hearing loss, bilateral: Secondary | ICD-10-CM | POA: Diagnosis not present

## 2015-05-16 DIAGNOSIS — F29 Unspecified psychosis not due to a substance or known physiological condition: Secondary | ICD-10-CM | POA: Diagnosis not present

## 2015-05-16 DIAGNOSIS — E1122 Type 2 diabetes mellitus with diabetic chronic kidney disease: Secondary | ICD-10-CM | POA: Diagnosis present

## 2015-05-16 DIAGNOSIS — I519 Heart disease, unspecified: Secondary | ICD-10-CM

## 2015-05-16 DIAGNOSIS — N2581 Secondary hyperparathyroidism of renal origin: Secondary | ICD-10-CM | POA: Diagnosis present

## 2015-05-16 DIAGNOSIS — K5090 Crohn's disease, unspecified, without complications: Secondary | ICD-10-CM | POA: Diagnosis present

## 2015-05-16 DIAGNOSIS — N189 Chronic kidney disease, unspecified: Secondary | ICD-10-CM | POA: Diagnosis not present

## 2015-05-16 DIAGNOSIS — S82002D Unspecified fracture of left patella, subsequent encounter for closed fracture with routine healing: Secondary | ICD-10-CM | POA: Diagnosis not present

## 2015-05-16 DIAGNOSIS — L03116 Cellulitis of left lower limb: Secondary | ICD-10-CM | POA: Diagnosis present

## 2015-05-16 DIAGNOSIS — I70245 Atherosclerosis of native arteries of left leg with ulceration of other part of foot: Secondary | ICD-10-CM | POA: Diagnosis present

## 2015-05-16 DIAGNOSIS — I5022 Chronic systolic (congestive) heart failure: Secondary | ICD-10-CM | POA: Diagnosis present

## 2015-05-16 DIAGNOSIS — H548 Legal blindness, as defined in USA: Secondary | ICD-10-CM | POA: Diagnosis not present

## 2015-05-16 DIAGNOSIS — Z7982 Long term (current) use of aspirin: Secondary | ICD-10-CM | POA: Diagnosis not present

## 2015-05-16 DIAGNOSIS — I120 Hypertensive chronic kidney disease with stage 5 chronic kidney disease or end stage renal disease: Secondary | ICD-10-CM | POA: Diagnosis present

## 2015-05-16 LAB — CBC WITH DIFFERENTIAL/PLATELET
Basophils Absolute: 0.1 10*3/uL (ref 0.0–0.1)
Basophils Relative: 1 % (ref 0–1)
Eosinophils Absolute: 0.2 10*3/uL (ref 0.0–0.7)
Eosinophils Relative: 2 % (ref 0–5)
HCT: 37.2 % (ref 36.0–46.0)
Hemoglobin: 12.1 g/dL (ref 12.0–15.0)
Lymphocytes Relative: 9 % — ABNORMAL LOW (ref 12–46)
Lymphs Abs: 1 10*3/uL (ref 0.7–4.0)
MCH: 30.2 pg (ref 26.0–34.0)
MCHC: 32.5 g/dL (ref 30.0–36.0)
MCV: 92.8 fL (ref 78.0–100.0)
Monocytes Absolute: 1.7 10*3/uL — ABNORMAL HIGH (ref 0.1–1.0)
Monocytes Relative: 16 % — ABNORMAL HIGH (ref 3–12)
Neutro Abs: 7.9 10*3/uL — ABNORMAL HIGH (ref 1.7–7.7)
Neutrophils Relative %: 72 % (ref 43–77)
Platelets: 455 10*3/uL — ABNORMAL HIGH (ref 150–400)
RBC: 4.01 MIL/uL (ref 3.87–5.11)
RDW: 17.1 % — ABNORMAL HIGH (ref 11.5–15.5)
WBC: 10.9 10*3/uL — ABNORMAL HIGH (ref 4.0–10.5)

## 2015-05-16 LAB — BASIC METABOLIC PANEL
Anion gap: 14 (ref 5–15)
BUN: 31 mg/dL — ABNORMAL HIGH (ref 6–20)
CO2: 28 mmol/L (ref 22–32)
Calcium: 8.9 mg/dL (ref 8.9–10.3)
Chloride: 89 mmol/L — ABNORMAL LOW (ref 101–111)
Creatinine, Ser: 4.62 mg/dL — ABNORMAL HIGH (ref 0.44–1.00)
GFR calc Af Amer: 11 mL/min — ABNORMAL LOW (ref >60)
GFR calc non Af Amer: 10 mL/min — ABNORMAL LOW (ref >60)
Glucose, Bld: 90 mg/dL (ref 65–99)
Potassium: 4.8 mmol/L (ref 3.5–5.1)
Sodium: 131 mmol/L — ABNORMAL LOW (ref 135–145)

## 2015-05-16 SURGERY — OPEN REDUCTION INTERNAL FIXATION (ORIF) PATELLA
Anesthesia: General | Laterality: Left

## 2015-05-16 MED ORDER — FENTANYL CITRATE (PF) 250 MCG/5ML IJ SOLN
INTRAMUSCULAR | Status: AC
  Filled 2015-05-16: qty 5

## 2015-05-16 MED ORDER — HYDROMORPHONE HCL 1 MG/ML IJ SOLN
0.5000 mg | INTRAMUSCULAR | Status: DC | PRN
  Administered 2015-05-16 – 2015-05-18 (×5): 0.5 mg via INTRAVENOUS
  Filled 2015-05-16 (×4): qty 1

## 2015-05-16 MED ORDER — HYDROCODONE-ACETAMINOPHEN 5-325 MG PO TABS
1.0000 | ORAL_TABLET | ORAL | Status: DC | PRN
  Administered 2015-05-16 – 2015-05-18 (×7): 1 via ORAL
  Filled 2015-05-16 (×7): qty 1

## 2015-05-16 MED ORDER — HEPARIN SODIUM (PORCINE) 5000 UNIT/ML IJ SOLN
5000.0000 [IU] | Freq: Three times a day (TID) | INTRAMUSCULAR | Status: DC
  Administered 2015-05-17: 5000 [IU] via SUBCUTANEOUS
  Filled 2015-05-16: qty 1

## 2015-05-16 MED ORDER — HYDROMORPHONE HCL 1 MG/ML IJ SOLN
1.0000 mg | Freq: Once | INTRAMUSCULAR | Status: AC
  Administered 2015-05-16: 1 mg via INTRAVENOUS
  Filled 2015-05-16: qty 1

## 2015-05-16 MED ORDER — METOPROLOL SUCCINATE ER 50 MG PO TB24
50.0000 mg | ORAL_TABLET | Freq: Every day | ORAL | Status: DC
  Administered 2015-05-16 – 2015-05-18 (×3): 50 mg via ORAL
  Filled 2015-05-16 (×3): qty 1

## 2015-05-16 MED ORDER — DSS 100 MG PO CAPS: 100.0000 mg | ORAL_CAPSULE | Freq: Two times a day (BID) | ORAL | Status: DC

## 2015-05-16 MED ORDER — ASPIRIN EC 81 MG PO TBEC
81.0000 mg | DELAYED_RELEASE_TABLET | Freq: Every day | ORAL | Status: DC
  Administered 2015-05-16 – 2015-05-18 (×3): 81 mg via ORAL
  Filled 2015-05-16 (×4): qty 1

## 2015-05-16 MED ORDER — PROPOFOL 10 MG/ML IV BOLUS
INTRAVENOUS | Status: AC
  Filled 2015-05-16: qty 20

## 2015-05-16 MED ORDER — DOCUSATE SODIUM 100 MG PO CAPS
100.0000 mg | ORAL_CAPSULE | Freq: Two times a day (BID) | ORAL | Status: DC
  Administered 2015-05-16 – 2015-05-18 (×4): 100 mg via ORAL
  Filled 2015-05-16 (×4): qty 1

## 2015-05-16 MED ORDER — ONDANSETRON HCL 4 MG/2ML IJ SOLN
4.0000 mg | Freq: Once | INTRAMUSCULAR | Status: AC
  Administered 2015-05-16: 4 mg via INTRAVENOUS
  Filled 2015-05-16: qty 2

## 2015-05-16 MED ORDER — CLOPIDOGREL BISULFATE 75 MG PO TABS
75.0000 mg | ORAL_TABLET | Freq: Every day | ORAL | Status: DC
  Administered 2015-05-16 – 2015-05-18 (×2): 75 mg via ORAL
  Filled 2015-05-16 (×2): qty 1

## 2015-05-16 MED ORDER — ONDANSETRON HCL 4 MG PO TABS: 4.0000 mg | ORAL_TABLET | Freq: Four times a day (QID) | ORAL | Status: DC | PRN

## 2015-05-16 MED ORDER — MIDAZOLAM HCL 2 MG/2ML IJ SOLN
INTRAMUSCULAR | Status: AC
  Filled 2015-05-16: qty 2

## 2015-05-16 MED ORDER — ACETAMINOPHEN 325 MG PO TABS
650.0000 mg | ORAL_TABLET | Freq: Three times a day (TID) | ORAL | Status: DC
  Administered 2015-05-16 – 2015-05-17 (×4): 650 mg via ORAL
  Filled 2015-05-16 (×4): qty 2

## 2015-05-16 MED ORDER — PRO-STAT SUGAR FREE PO LIQD
30.0000 mL | Freq: Three times a day (TID) | ORAL | Status: DC
  Administered 2015-05-16 – 2015-05-18 (×4): 30 mL via ORAL
  Filled 2015-05-16 (×9): qty 30

## 2015-05-16 MED ORDER — AMIODARONE HCL 100 MG PO TABS
200.0000 mg | ORAL_TABLET | Freq: Every day | ORAL | Status: DC
  Administered 2015-05-16 – 2015-05-18 (×3): 200 mg via ORAL
  Filled 2015-05-16 (×3): qty 2

## 2015-05-16 MED ORDER — NEPRO/CARBSTEADY PO LIQD
237.0000 mL | ORAL | Status: DC | PRN
  Filled 2015-05-16: qty 237

## 2015-05-16 MED ORDER — ONDANSETRON HCL 4 MG/2ML IJ SOLN: 4.0000 mg | Freq: Four times a day (QID) | INTRAMUSCULAR | Status: DC | PRN

## 2015-05-16 MED ORDER — ATORVASTATIN CALCIUM 40 MG PO TABS
40.0000 mg | ORAL_TABLET | Freq: Every day | ORAL | Status: DC
  Administered 2015-05-16 – 2015-05-18 (×3): 40 mg via ORAL
  Filled 2015-05-16 (×3): qty 1

## 2015-05-16 MED ORDER — LACTULOSE 10 GM/15ML PO SOLN: 20.0000 g | Freq: Two times a day (BID) | ORAL | Status: DC | PRN

## 2015-05-16 MED ORDER — ADULT MULTIVITAMIN W/MINERALS CH
1.0000 | ORAL_TABLET | Freq: Every day | ORAL | Status: DC
  Administered 2015-05-16 – 2015-05-18 (×2): 1 via ORAL
  Filled 2015-05-16 (×2): qty 1

## 2015-05-16 MED ORDER — NITROGLYCERIN 0.4 MG SL SUBL: 0.4000 mg | SUBLINGUAL_TABLET | SUBLINGUAL | Status: DC | PRN

## 2015-05-16 MED ORDER — CALCIUM ACETATE (PHOS BINDER) 667 MG PO CAPS
667.0000 mg | ORAL_CAPSULE | Freq: Three times a day (TID) | ORAL | Status: DC
  Administered 2015-05-16 – 2015-05-18 (×3): 667 mg via ORAL
  Filled 2015-05-16 (×5): qty 1

## 2015-05-16 NOTE — Progress Notes (Signed)
Patient's son came to visit. Son states that his mother now agrees to the knee surgery. Patient relies on her son to make medical decisions as he is POA. Please contact son Litsa Cure [phone] regarding consents. Patient is deaf and blind and needs a sign interpreter present to discuss medical treatments.  Patient can hold the signers hands in order to communicate.  Patient is in a much more pleasant mood now that her son is here. Dr. Marlou Sa notified about patient's consent to surgery. Dr. Sharol Given scheduled to perform the surgery on tomorrow afternoon 05/17/15. Patient and son are both aware. Will continue to monitor.

## 2015-05-16 NOTE — ED Notes (Signed)
MD at bedside updating patient of plan.

## 2015-05-16 NOTE — ED Notes (Signed)
Ortho tech applied knee immobilizer.

## 2015-05-16 NOTE — ED Provider Notes (Signed)
CSN: [id number]     Arrival date & time 05/16/15  0054 History   This chart was scribed for Linton Flemings, MD by Chester Holstein, ED Scribe. This patient was seen in room D35C/D35C and the patient's care was started at 1:26 AM.    Chief Complaint  Patient presents with  . Foot Pain   LEVEL 5 CAVEAT  The history is provided by the patient and medical records. The history is limited by the condition of the patient. No language interpreter was used.   HPI Comments: SATINE BELLINA is a 59 y.o. female brought in by ambulance, with PMHx of HLD, HTN, DM, CAD, anemia, ESRD, CHF, and MI who presents to the Emergency Department complaining of left knee pain with unknown onset. Pt is deaf and blind. Pt here from [facility]. She is s/p transmetatarsal amputation yesterday with associated foot pain. Per nurse pt fell at nursing home after surgery with noted swelling in left leg. Pt was last given percocet at 9 PM.  Pt states "pain" with touch to left knee and left hip. ROS limited due to pt's limited ability to communicate.   Past Medical History  Diagnosis Date  . Hyperlipidemia   . Hypertension     a. Has previously refused blood pressure meds.   . Deaf     Divorced from husband but lives with him. Has daughter but she does not care for her.  . Bilateral leg edema     a. Chronic.  Marland Kitchen Psychiatric disorder     She frequently exhibits paranoia and has been diagnosed with psychotic d/o NOS during hospital stay in the past. She is tangetial and perseverative during her visits. She apparently has had a bad experience with mental health in [place] in the past and refuses to discuss mental issues for fear that she will be sent back there. Her paranoia, communication issues, financial woes and lack of fam  . Fibroid uterus   . Anemia     Due to fibroids.  . Heart murmur   . Diabetes mellitus     a. Per PCP note 2012 (A1C 9.2) - pt unwilling to take meds and was educated on risk of uncontrolled DM.  b.  A1C 5.9 in 11/2013.  Marland Kitchen Coronary artery disease   . Blind   . ESRD on hemodialysis     Mon, Wed, Fri  . CHF (congestive heart failure)   . MI (myocardial infarction) 11/2014   Past Surgical History  Procedure Laterality Date  . No past surgeries    . Insertion of dialysis catheter Right 01/02/2014    Procedure: INSERTION OF DIALYSIS CATHETER;  Surgeon: Rosetta Posner, MD;  Location: [place];  Service: Vascular;  Laterality: Right;  . Av fistula placement Left 01/02/2014    Procedure: ARTERIOVENOUS (AV) FISTULA CREATION;  Surgeon: Rosetta Posner, MD;  Location: [place];  Service: Vascular;  Laterality: Left;  . Amputation Bilateral 04/02/2015    Procedure: Amputation Bilateral Great Toes MTP Joint;  Surgeon: Newt Minion, MD;  Location: [place];  Service: Orthopedics;  Laterality: Bilateral;   Family History  Problem Relation Age of Onset  . Other Father     Drowned from fishing?  . Heart disease     History  Substance Use Topics  . Smoking status: Never Smoker   . Smokeless tobacco: Not on file     Comment: Prior dip  . Alcohol Use: No   OB History  Gravida Para Term Preterm AB TAB SAB Ectopic Multiple Living   1 1        1      Review of Systems  Unable to perform ROS: Other  Musculoskeletal: Positive for joint swelling and arthralgias.      Allergies  Review of patient's allergies indicates no known allergies.  Home Medications   Prior to Admission medications   Medication Sig Start Date End Date Taking? Authorizing Provider  acetaminophen (TYLENOL) 325 MG tablet Take 650 mg by mouth 3 (three) times daily as needed (pain).     Historical Provider, MD  Amino Acids-Protein Hydrolys (FEEDING SUPPLEMENT, PRO-STAT SUGAR FREE 64,) LIQD Take 30 mLs by mouth 3 (three) times daily with meals. Patient not taking: Reported on 04/18/2015 12/21/14   Delfina Redwood, MD  amiodarone (PACERONE) 200 MG tablet Take 200 mg by mouth daily.    Historical Provider, MD  aspirin 81 MG tablet Take  81 mg by mouth daily. For hypertension    Historical Provider, MD  atorvastatin (LIPITOR) 10 MG tablet Take 10 mg by mouth daily.    Historical Provider, MD  calcium acetate (PHOSLO) 667 MG capsule Take 1 capsule (667 mg total) by mouth 3 (three) times daily with meals. 01/08/14   Kelvin Cellar, MD  clopidogrel (PLAVIX) 75 MG tablet Take 1 tablet (75 mg total) by mouth daily. 11/26/14   Ripudeep Krystal Eaton, MD  Docusate Sodium (DSS) 100 MG CAPS Take 100 mg by mouth 2 (two) times daily. For constipation 01/08/14   Kelvin Cellar, MD  doxycycline (VIBRA-TABS) 100 MG tablet Take 100 mg by mouth 2 (two) times daily.    Historical Provider, MD  guaiFENesin (MUCINEX) 600 MG 12 hr tablet Take 1 tablet (600 mg total) by mouth 2 (two) times daily. 01/16/15   Samella Parr, NP  hydrALAZINE (APRESOLINE) 25 MG tablet Take 1 tablet (25 mg total) by mouth 3 (three) times daily as needed (SBP > 150). 04/21/15   Geradine Girt, DO  HYDROcodone-acetaminophen (NORCO) 5-325 MG per tablet Take 1 tablet by mouth every 6 (six) hours as needed. 04/21/15   Geradine Girt, DO  lactulose (CHRONULAC) 10 GM/15ML solution Take 30 mLs (20 g total) by mouth 2 (two) times daily as needed for moderate constipation. 01/16/15   Samella Parr, NP  levalbuterol Penne Lash) 0.63 MG/3ML nebulizer solution Take 3 mLs (0.63 mg total) by nebulization every 2 (two) hours as needed for wheezing or shortness of breath. 01/16/15   Samella Parr, NP  lisinopril (PRINIVIL,ZESTRIL) 2.5 MG tablet Take 1 tablet (2.5 mg total) by mouth daily. 11/26/14   Ripudeep Krystal Eaton, MD  metoprolol succinate (TOPROL-XL) 50 MG 24 hr tablet Take 1 tablet (50 mg total) by mouth daily. Take with or immediately following a meal. 11/26/14   Ripudeep Krystal Eaton, MD  Multiple Vitamin (MULTIVITAMIN WITH MINERALS) TABS tablet Take 2 tablets by mouth daily.    Historical Provider, MD  nitroGLYCERIN (NITROSTAT) 0.4 MG SL tablet Place 1 tablet (0.4 mg total) under the tongue every 5 (five)  minutes as needed for chest pain. 11/26/14   Ripudeep Krystal Eaton, MD  Nutritional Supplements (FEEDING SUPPLEMENT, NEPRO CARB STEADY,) LIQD Take 237 mLs by mouth as needed (missed meal during dialysis.). 04/21/15   Geradine Girt, DO   BP 166/61 mmHg  Pulse 89  Temp(Src) 98.7 F (37.1 C) (Oral)  Resp 18  Ht 5\' 7"  (1.702 m)  Wt 180 lb (81.647 kg)  BMI  28.19 kg/m2  SpO2 97% Physical Exam  Constitutional: She is oriented to person, place, and time. She appears well-developed and well-nourished. She appears distressed.  HENT:  Head: Normocephalic and atraumatic.  Nose: Nose normal.  Mouth/Throat: Oropharynx is clear and moist.  Eyes: Conjunctivae and EOM are normal. Pupils are equal, round, and reactive to light.  Neck: Normal range of motion. Neck supple. No JVD present. No tracheal deviation present. No thyromegaly present.  Cardiovascular: Normal rate, regular rhythm, normal heart sounds and intact distal pulses.  Exam reveals no gallop and no friction rub.   No murmur heard. Pulmonary/Chest: Effort normal and breath sounds normal. No stridor. No respiratory distress. She has no wheezes. She has no rales. She exhibits no tenderness.  Abdominal: Soft. Bowel sounds are normal. She exhibits no distension and no mass. There is no tenderness. There is no rebound and no guarding.  Musculoskeletal: Normal range of motion. She exhibits no edema or tenderness.  Dressing to left foot clean, dry, intact.  Left knee is swollen, not warm.  She has positive effusion.  Patient has good range of motion at the knee.  She appears to have pain with palpation.  She has normal range of motion of the hip, but complains of pain that extends from foot to hip  Lymphadenopathy:    She has no cervical adenopathy.  Neurological: She is alert and oriented to person, place, and time. She displays normal reflexes. She exhibits normal muscle tone. Coordination normal.  Skin: Skin is warm and dry. No rash noted. No erythema. No  pallor.  Nursing note and vitals reviewed.   ED Course  Procedures (including critical care time) DIAGNOSTIC STUDIES: Oxygen Saturation is 99% on room air, normal by my interpretation.    COORDINATION OF CARE: 1:32 AM Discussed treatment plan with patient at beside, the patient agrees with the plan and has no further questions at this time.   Labs Review Labs Reviewed  BASIC METABOLIC PANEL - Abnormal; Notable for the following:    Sodium 131 (*)    Chloride 89 (*)    BUN 31 (*)    Creatinine, Ser 4.62 (*)    GFR calc non Af Amer 10 (*)    GFR calc Af Amer 11 (*)    All other components within normal limits  CBC WITH DIFFERENTIAL/PLATELET - Abnormal; Notable for the following:    WBC 10.9 (*)    RDW 17.1 (*)    Platelets 455 (*)    Neutro Abs 7.9 (*)    Lymphocytes Relative 9 (*)    Monocytes Relative 16 (*)    Monocytes Absolute 1.7 (*)    All other components within normal limits    Imaging Review Dg Knee 2 Views Left  05/16/2015   CLINICAL DATA:  Swelling at the left knee, acute onset. Initial encounter.  EXAM: LEFT KNEE - 1-2 VIEW  COMPARISON:  None.  FINDINGS: There is a comminuted fracture involving the lower pole of the patella, with approximately 3 cm of separation and diffuse overlying soft tissue swelling. An associated moderate to large knee joint effusion is noted.  No additional fractures are seen. Mild marginal osteophytes are seen arising at the lateral compartment.  IMPRESSION: Comminuted fracture involving the lower pole of the patella, with approximately 3 cm of separation at the fracture, and diffuse overlying soft tissue swelling. Associated moderate to large knee joint effusion noted.   Electronically Signed   By: Garald Balding M.D.   On: 05/16/2015  03:10   Dg Hip Unilat With Pelvis 2-3 Views Left  05/16/2015   CLINICAL DATA:  Acute onset of left hip pain.  Initial encounter.  EXAM: LEFT HIP (WITH PELVIS) 2-3 VIEWS  COMPARISON:  None.  FINDINGS: There is no  evidence of fracture or dislocation. Both femoral heads are seated normally within their respective acetabula. The proximal left femur appears intact. No significant degenerative change is appreciated. The sacroiliac joints are unremarkable in appearance.  The visualized bowel gas pattern is grossly unremarkable in appearance. Calcified fibroids are noted overlying the pelvis.  IMPRESSION: 1. No evidence of fracture or dislocation. 2. Calcified fibroids noted.   Electronically Signed   By: Garald Balding M.D.   On: 05/16/2015 03:18     EKG Interpretation None      MDM   Final diagnoses:  Fall  Fall at nursing home, initial encounter  End stage renal disease on dialysis  Diabetes mellitus with ESRD (end-stage renal disease)  Deaf mutism, congenital  Patellar fracture, left, closed, initial encounter   I personally performed the services described in this documentation, which was scribed in my presence. The recorded information has been reviewed and is accurate.  59 year old female who is deaf and blind and difficult to communicate with.  Patient able to understand writing on her arm and doesn't same.  She had left transmetatarsal amputation of foot today, and his reported that she is experiencing increased pain.  Her left knee appears to be swollen.  She seems to indicate that she has pain going from her foot up into her hip.  She is requesting IV pain medicine.  We'll get x-rays of left knee due to swelling, give pain medicine.   Nursing staff contacted her nursing facility who reports that she had a fall today with pain in left knee and hip.  X-ray show patellar fracture.  We'll touch base with [facility].  Further evaluation.  5:56 AM Case d/w Dr Marlou Sa.  Pt will need operative repair of the knee.  As she is dialysis, he would prefer she have dialysis day prior to surgery.  Normally has dialysis MWF, but as she had surgery on Friday, she had dialysis on Saturday.  Linton Flemings,  MD 05/16/15 [phone]

## 2015-05-16 NOTE — ED Notes (Signed)
Pt comes from [facility], pt had left toes amputated yesterday and was sent out for pain management. Pt is death and blind. Pt currently on oxy 5/325 last dose at 9pm.

## 2015-05-16 NOTE — ED Notes (Signed)
Admitting MD at bedside. Pt response when touched and communicates by writing on arm with fingertip.

## 2015-05-16 NOTE — ED Notes (Signed)
Pt last had dialysis on Saturday 5/21

## 2015-05-16 NOTE — Progress Notes (Signed)
Orthopedic Tech Progress Note Patient Details:  [name] [birth year] [id number]  Ortho Devices Type of Ortho Device: Knee Immobilizer Ortho Device/Splint Interventions: Application   Katheren Shams 05/16/2015, 6:12 AM

## 2015-05-16 NOTE — Consult Note (Signed)
[facility] Renal Consultation Note    Indication for Consultation:  Management of ESRD/hemodialysis; anemia, hypertension/volume and secondary hyperparathyroidism PCP: Lives at [facility]  HPI: [name] is a 59 y.o. female with ESRD secondary to HTN/DM on MWF dialysis at [facility].  PMHx also significant for hx MI, EF 30 - 35%,  amputated bilateral great toes 4/16 secondary to PVD.  She just had a left trans met amputation 5/20 due to osteo and was to have been dialyzed 5/21 (Saturday).  She fell today at her nursing home and sustained a patellar fracture. Specifics of the fall are not available.  She is refusing the surgery and knee is currently immobilized. She is not in acute distress, but becomes agitated at the suggestion of surgery.  Past Medical History  Diagnosis Date  . Hyperlipidemia   . Hypertension     a. Has previously refused blood pressure meds.   . Deaf     USE SIGN INTERPRETER.  Divorced from husband but lives with him. Has daughter but she does not care for her.  . Bilateral leg edema     a. Chronic.  Marland Kitchen Psychiatric disorder     She frequently exhibits paranoia and has been diagnosed with psychotic d/o NOS during hospital stay in the past. She is tangetial and perseverative during her visits. She apparently has had a bad experience with mental health in [place] in the past and refuses to discuss mental issues for fear that she will be sent back there. Her paranoia, communication issues, financial woes and lack of fam  . Fibroid uterus   . Anemia     Due to fibroids.  . Heart murmur   . Diabetes mellitus     a. Per PCP note 2012 (A1C 9.2) - pt unwilling to take meds and was educated on risk of uncontrolled DM.  b. A1C 5.9 in 11/2013.  Marland Kitchen Coronary artery disease   . Blind   . ESRD on hemodialysis     Mon, Wed, Fri  . CHF (congestive heart failure)   . MI (myocardial infarction) 11/2014   Past Surgical History  Procedure Laterality Date  . No past  surgeries    . Insertion of dialysis catheter Right 01/02/2014    Procedure: INSERTION OF DIALYSIS CATHETER;  Surgeon: Rosetta Posner, MD;  Location: [facility];  Service: Vascular;  Laterality: Right;  . Av fistula placement Left 01/02/2014    Procedure: ARTERIOVENOUS (AV) FISTULA CREATION;  Surgeon: Rosetta Posner, MD;  Location: [facility];  Service: Vascular;  Laterality: Left;  . Amputation Bilateral 04/02/2015    Procedure: Amputation Bilateral Great Toes MTP Joint;  Surgeon: Newt Minion, MD;  Location: [facility];  Service: Orthopedics;  Laterality: Bilateral;   Family History  Problem Relation Age of Onset  . Other Father     Drowned from fishing?  . Heart disease     Social History:  reports that she has never smoked. She does not have any smokeless tobacco history on file. She reports that she does not drink alcohol or use illicit drugs. No Known Allergies Prior to Admission medications   Medication Sig Start Date End Date Taking? Authorizing Provider  acetaminophen (TYLENOL) 325 MG tablet Take 650 mg by mouth 3 (three) times daily.    Yes Historical Provider, MD  albuterol (PROVENTIL) (2.5 MG/3ML) 0.083% nebulizer solution Take 2.5 mg by nebulization every 2 (two) hours as needed for wheezing or shortness of breath.   Yes Historical  Provider, MD  Amino Acids-Protein Hydrolys (FEEDING SUPPLEMENT, PRO-STAT SUGAR FREE 64,) LIQD Take 30 mLs by mouth 3 (three) times daily with meals. 12/21/14  Yes Delfina Redwood, MD  amiodarone (PACERONE) 200 MG tablet Take 200 mg by mouth daily.   Yes Historical Provider, MD  aspirin 81 MG tablet Take 81 mg by mouth daily. For hypertension   Yes Historical Provider, MD  atorvastatin (LIPITOR) 10 MG tablet Take 10 mg by mouth daily.   Yes Historical Provider, MD  calcium acetate (PHOSLO) 667 MG capsule Take 1 capsule (667 mg total) by mouth 3 (three) times daily with meals. Patient taking differently: Take 667 mg by mouth See admin instructions. Twice daily on MWF. Once  daily Sun,Tues,Thurs, Sat. 01/08/14  Yes Kelvin Cellar, MD  clopidogrel (PLAVIX) 75 MG tablet Take 1 tablet (75 mg total) by mouth daily. 11/26/14  Yes Ripudeep Krystal Eaton, MD  Docusate Sodium (DSS) 100 MG CAPS Take 100 mg by mouth 2 (two) times daily. For constipation 01/08/14  Yes Kelvin Cellar, MD  guaiFENesin (MUCINEX) 600 MG 12 hr tablet Take 1 tablet (600 mg total) by mouth 2 (two) times daily. 01/16/15  Yes Samella Parr, NP  HYDROcodone-acetaminophen (NORCO) 5-325 MG per tablet Take 1 tablet by mouth every 6 (six) hours as needed. 04/21/15  Yes Geradine Girt, DO  metoprolol succinate (TOPROL-XL) 50 MG 24 hr tablet Take 1 tablet (50 mg total) by mouth daily. Take with or immediately following a meal. 11/26/14  Yes Ripudeep Krystal Eaton, MD  Multiple Vitamin (MULTIVITAMIN WITH MINERALS) TABS tablet Take 2 tablets by mouth daily.   Yes Historical Provider, MD  Nutritional Supplements (FEEDING SUPPLEMENT, NEPRO CARB STEADY,) LIQD Take 237 mLs by mouth as needed (missed meal during dialysis.). Patient taking differently: Take 60 mLs by mouth 2 (two) times daily.  04/21/15  Yes Jessica U Vann, DO  lactulose (CHRONULAC) 10 GM/15ML solution Take 30 mLs (20 g total) by mouth 2 (two) times daily as needed for moderate constipation. 01/16/15   Samella Parr, NP  nitroGLYCERIN (NITROSTAT) 0.4 MG SL tablet Place 1 tablet (0.4 mg total) under the tongue every 5 (five) minutes as needed for chest pain. 11/26/14   Ripudeep Krystal Eaton, MD   Current Facility-Administered Medications  Medication Dose Route Frequency Provider Last Rate Last Dose  . acetaminophen (TYLENOL) tablet 650 mg  650 mg Oral TID Janece Canterbury, MD   650 mg at 05/16/15 1106  . amiodarone (PACERONE) tablet 200 mg  200 mg Oral Daily Janece Canterbury, MD   200 mg at 05/16/15 1106  . aspirin EC tablet 81 mg  81 mg Oral Daily Janece Canterbury, MD   81 mg at 05/16/15 1106  . atorvastatin (LIPITOR) tablet 40 mg  40 mg Oral Daily Janece Canterbury, MD   40 mg at  05/16/15 1105  . calcium acetate (PHOSLO) capsule 667 mg  667 mg Oral TID WC Janece Canterbury, MD   667 mg at 05/16/15 1106  . clopidogrel (PLAVIX) tablet 75 mg  75 mg Oral Daily Janece Canterbury, MD   75 mg at 05/16/15 1106  . docusate sodium (COLACE) capsule 100 mg  100 mg Oral BID Janece Canterbury, MD   100 mg at 05/16/15 1106  . feeding supplement (NEPRO CARB STEADY) liquid 237 mL  237 mL Oral PRN Janece Canterbury, MD      . feeding supplement (PRO-STAT SUGAR FREE 64) liquid 30 mL  30 mL Oral TID WC Janece Canterbury, MD      . [  START ON 05/17/2015] heparin injection 5,000 Units  5,000 Units Subcutaneous 3 times per day Janece Canterbury, MD      . HYDROcodone-acetaminophen (NORCO/VICODIN) 5-325 MG per tablet 1 tablet  1 tablet Oral Q4H PRN Janece Canterbury, MD      . HYDROmorphone (DILAUDID) injection 0.5 mg  0.5 mg Intravenous Q4H PRN Janece Canterbury, MD      . lactulose (CHRONULAC) 10 GM/15ML solution 20 g  20 g Oral BID PRN Janece Canterbury, MD      . metoprolol succinate (TOPROL-XL) 24 hr tablet 50 mg  50 mg Oral Daily Janece Canterbury, MD   50 mg at 05/16/15 1106  . multivitamin with minerals tablet 1 tablet  1 tablet Oral Daily Janece Canterbury, MD   1 tablet at 05/16/15 1106  . nitroGLYCERIN (NITROSTAT) SL tablet 0.4 mg  0.4 mg Sublingual Q5 min PRN Janece Canterbury, MD      . ondansetron [facility]) tablet 4 mg  4 mg Oral Q6H PRN Janece Canterbury, MD       Or  . ondansetron (ZOFRAN) injection 4 mg  4 mg Intravenous Q6H PRN Janece Canterbury, MD       Labs: Basic Metabolic Panel:  Recent Labs Lab 05/16/15 0545  NA 131*  K 4.8  CL 89*  CO2 28  GLUCOSE 90  BUN 31*  CREATININE 4.62*  CALCIUM 8.9  CBC:  Recent Labs Lab 05/16/15 0545  WBC 10.9*  NEUTROABS 7.9*  HGB 12.1  HCT 37.2  MCV 92.8  PLT 455*   CBG:  Recent Labs Lab 05/14/15 1456  GLUCAP 78   Studies/Results: Dg Knee 2 Views Left  05/16/2015   CLINICAL DATA:  Swelling at the left knee, acute onset. Initial encounter.   EXAM: LEFT KNEE - 1-2 VIEW  COMPARISON:  None.  FINDINGS: There is a comminuted fracture involving the lower pole of the patella, with approximately 3 cm of separation and diffuse overlying soft tissue swelling. An associated moderate to large knee joint effusion is noted.  No additional fractures are seen. Mild marginal osteophytes are seen arising at the lateral compartment.  IMPRESSION: Comminuted fracture involving the lower pole of the patella, with approximately 3 cm of separation at the fracture, and diffuse overlying soft tissue swelling. Associated moderate to large knee joint effusion noted.   Electronically Signed   By: Garald Balding M.D.   On: 05/16/2015 03:10   Dg Hip Unilat With Pelvis 2-3 Views Left  05/16/2015   CLINICAL DATA:  Acute onset of left hip pain.  Initial encounter.  EXAM: LEFT HIP (WITH PELVIS) 2-3 VIEWS  COMPARISON:  None.  FINDINGS: There is no evidence of fracture or dislocation. Both femoral heads are seated normally within their respective acetabula. The proximal left femur appears intact. No significant degenerative change is appreciated. The sacroiliac joints are unremarkable in appearance.  The visualized bowel gas pattern is grossly unremarkable in appearance. Calcified fibroids are noted overlying the pelvis.  IMPRESSION: 1. No evidence of fracture or dislocation. 2. Calcified fibroids noted.   Electronically Signed   By: Garald Balding M.D.   On: 05/16/2015 03:18    ROS: As per HPI otherwise negative. LImited by communication issues- communicates with hand signs/spelling on hand or arm.  Physical Exam: Filed Vitals:   05/16/15 0703 05/16/15 0730 05/16/15 0802 05/16/15 0833  BP: 102/48 102/48  120/51  Pulse: 77 78  81  Temp:   98.9 F (37.2 C) 98.9 F (37.2 C)  TempSrc:  Oral Oral  Resp:  17    Height:      Weight:      SpO2: 94% 92%  98%     General: Well developed, well nourished NAD,  Head: Normocephalic, atraumatic, sclera non-icteric, mucus  membranes are moist Blind. Deaf. Neck: Supple. JVD not elevated. Lungs: Clear bilaterally to auscultation without wheezes, rales, or rhonchi. Breathing is unlabored. Heart: RRR with S1 S2. No murmurs, rubs, or gallops appreciated. Abdomen: Soft, non-tender, non-distended with normoactive bowel sounds.  Lower extremities:  Right without edema or ischemic changes, no open wounds ; Left - transmet wrapped; left knee in immobilizer Neuro: Alert.Marland Kitchen Psych:  Anxious response at times and calms down Dialysis Access: left AVF + bruit  Dialysis Orders: Center: [facility] MWF 180 4.25 hr EDW 78.5 2 K 2.5 Ca left upper AVF Heparin 9000, Mircera 225 q 2 weeks-given 5/18, No Fe, Hecotorol 1,  Recent labs:  Hgb 10.6 5/18  tsa 8% 03/2015 ferritin was 1700s - received venofer 100 x 2 in April  Assessment/Plan: 1. Left patellar fracture - pt declined surgery today. Per nursing, her son is to come today to help with communication and comprehension of the need for surgery along with a special translator. 2. ESRD -  MWF Reportedly dialyzed ysterday.  [facility] access - does not reflect attendance but nursing notes state she went to dialysis on Saturday . Use  no heparin HD due to recent transmet and new patellar fx; plan HD on Monday - can work around any surgery 3. Hypertension/volume  - controlled  4. Anemia  - Hgb 12.1 - no ESA for now - last Mircera was 225 5/18 - due for redose 6/1- next dose needs to be titrated down if not held 5. Metabolic bone disease -  Continue Hectorol 1 6. Nutrition - renal carb mod diet 7. Recent left transmet due to osteo 5/20 Dr. Sharol Given 8. CAD / chron systolic CHF - EF 09%  Myriam Jacobson, PA-C [place] [phone] 05/16/2015, 12:24 PM   Pt seen, examined and agree w A/P as above.  Kelly Splinter MD pager [phone]    cell [phone] 05/16/2015, 2:52 PM

## 2015-05-16 NOTE — Progress Notes (Signed)
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 She was transferred back to the nursing home where she fell today. She sustained a patella fracture. Patient is deaf and blind. She was tentatively posted for surgical intervention however when I discussed with the patient via the nurse communication she is adamant that she does not want surgery. At this time I'm going to hold off on surgical intervention and pass her off to Dr. Sharol Given for further discussion with her and the family monday. Without surgical intervention for her displaced patella fracture she will be at very high risk for falls and subsequent injury.

## 2015-05-16 NOTE — ED Notes (Signed)
Ortho tech called to place knee immobilizer

## 2015-05-16 NOTE — Anesthesia Preprocedure Evaluation (Deleted)
Anesthesia Evaluation    Airway        Dental   Pulmonary COPD COPD inhaler,          Cardiovascular hypertension, Pt. on medications and Pt. on home beta blockers + CAD, + Past MI, + Peripheral Vascular Disease and +CHF + dysrhythmias Ventricular Tachycardia  4/16 ECHO: EF 30-35%, Anteroseptal and apical akinesis with overall moderate to severe LV dysfunction; mild LVH, mild MR, mild TR 12/15 stress: Fixed defects involving the apex, anterior lateral wall and the inferior wall, no ischemia   Neuro/Psych    GI/Hepatic   Endo/Other  diabetes, Insulin Dependent  Renal/GU Dialysis and ESRFRenal disease (K+ 4.8)     Musculoskeletal   Abdominal   Peds  Hematology   Anesthesia Other Findings   Reproductive/Obstetrics                           Anesthesia Physical Anesthesia Plan Anesthesia Quick Evaluation

## 2015-05-16 NOTE — ED Notes (Signed)
NPO per MD request due to possible surgery today.

## 2015-05-16 NOTE — H&P (Addendum)
[facility] History and Physical  [name] [id number] DOB: 01-14-1956 DOA: 05/16/2015  Referring physician:  Linton Flemings PCP:  [name], DO   Chief Complaint:  Knee pain  HPI:  The patient is a 59 y.o. year-old deaf and blind female with history of diabetes mellitus type 2, chronic systolic heart failure with EF 30%, coronary artery disease, sustained VT, ESRD on HD normally MWF, anemia of renaldisease who presents with right knee pain.  The patient was admitted in January with bronchitis and last month with hypertensive urgency and acute diastolic heart failure.  She underwent left forefoot resection on 5/20 by Dr. 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  She was discharged to [facility], however, she had a fall out of bed on 5/21 and developed increased pain in swelling in her left leg which necessitated transfer to ER.  She is complaining of pain in her left knee.    In the ER, VS notable for low normal blood pressures, labs consistent with ESRD with WBC 10.9 and platelets 455.  X-rays demonstrated a patellar knee fracture with 3cm of separation at the fracture, large knee effusion and diffuse overlying soft tissue. The case was discussed with Dr. Marlou Sa who recommended admission today, dialysis likely tomorrow followed by surgery.    Review of Systems: limited by patient's blindness, deafness, but c/o pain in the left knee and itching.  Past Medical History  Diagnosis Date  . Hyperlipidemia   . Hypertension     a. Has previously refused blood pressure meds.   . Deaf     Divorced from husband but lives with him. Has daughter but she does not care for her.  . Bilateral leg edema     a. Chronic.  Marland Kitchen Psychiatric disorder     She frequently exhibits paranoia and has been diagnosed with psychotic d/o NOS during hospital stay in the past. She is tangetial and perseverative during her visits. She apparently has  had a bad experience with mental health in [place] in the past and refuses to discuss mental issues for fear that she will be sent back there. Her paranoia, communication issues, financial woes and lack of fam  . Fibroid uterus   . Anemia     Due to fibroids.  . Heart murmur   . Diabetes mellitus     a. Per PCP note 2012 (A1C 9.2) - pt unwilling to take meds and was educated on risk of uncontrolled DM.  b. A1C 5.9 in 11/2013.  Marland Kitchen Coronary artery disease   . Blind   . ESRD on hemodialysis     Mon, Wed, Fri  . CHF (congestive heart failure)   . MI (myocardial infarction) 11/2014   Past Surgical History  Procedure Laterality Date  . No past surgeries    . Insertion of dialysis catheter Right 01/02/2014    Procedure: INSERTION OF DIALYSIS CATHETER;  Surgeon: Rosetta Posner, MD;  Location: [place];  Service: Vascular;  Laterality: Right;  . Av fistula placement Left 01/02/2014    Procedure: ARTERIOVENOUS (AV) FISTULA CREATION;  Surgeon: Rosetta Posner, MD;  Location: [place];  Service: Vascular;  Laterality: Left;  . Amputation Bilateral 04/02/2015    Procedure: Amputation Bilateral Great Toes MTP Joint;  Surgeon: Newt Minion, MD;  Location: [place];  Service: Orthopedics;  Laterality: Bilateral;   Social History:  reports that she has never smoked. She does not have any smokeless  tobacco history on file. She reports that she does not drink alcohol or use illicit drugs.   No Known Allergies  Family History  Problem Relation Age of Onset  . Other Father     Drowned from fishing?  . Heart disease       Prior to Admission medications   Medication Sig Start Date End Date Taking? Authorizing Provider  acetaminophen (TYLENOL) 325 MG tablet Take 650 mg by mouth 3 (three) times daily.    Yes Historical Provider, MD  albuterol (PROVENTIL) (2.5 MG/3ML) 0.083% nebulizer solution Take 2.5 mg by nebulization every 2 (two) hours as needed for wheezing or shortness of breath.   Yes Historical Provider, MD  Amino  Acids-Protein Hydrolys (FEEDING SUPPLEMENT, PRO-STAT SUGAR FREE 64,) LIQD Take 30 mLs by mouth 3 (three) times daily with meals. 12/21/14  Yes Delfina Redwood, MD  amiodarone (PACERONE) 200 MG tablet Take 200 mg by mouth daily.   Yes Historical Provider, MD  aspirin 81 MG tablet Take 81 mg by mouth daily. For hypertension   Yes Historical Provider, MD  atorvastatin (LIPITOR) 10 MG tablet Take 10 mg by mouth daily.   Yes Historical Provider, MD  calcium acetate (PHOSLO) 667 MG capsule Take 1 capsule (667 mg total) by mouth 3 (three) times daily with meals. Patient taking differently: Take 667 mg by mouth See admin instructions. Twice daily on MWF. Once daily Sun,Tues,Thurs, Sat. 01/08/14  Yes Kelvin Cellar, MD  clopidogrel (PLAVIX) 75 MG tablet Take 1 tablet (75 mg total) by mouth daily. 11/26/14  Yes Ripudeep Krystal Eaton, MD  Docusate Sodium (DSS) 100 MG CAPS Take 100 mg by mouth 2 (two) times daily. For constipation 01/08/14  Yes Kelvin Cellar, MD  guaiFENesin (MUCINEX) 600 MG 12 hr tablet Take 1 tablet (600 mg total) by mouth 2 (two) times daily. 01/16/15  Yes Samella Parr, NP  HYDROcodone-acetaminophen (NORCO) 5-325 MG per tablet Take 1 tablet by mouth every 6 (six) hours as needed. 04/21/15  Yes Geradine Girt, DO  lisinopril (PRINIVIL,ZESTRIL) 2.5 MG tablet Take 1 tablet (2.5 mg total) by mouth daily. 11/26/14  Yes Ripudeep Krystal Eaton, MD  metoprolol succinate (TOPROL-XL) 50 MG 24 hr tablet Take 1 tablet (50 mg total) by mouth daily. Take with or immediately following a meal. 11/26/14  Yes Ripudeep Krystal Eaton, MD  Multiple Vitamin (MULTIVITAMIN WITH MINERALS) TABS tablet Take 2 tablets by mouth daily.   Yes Historical Provider, MD  Nutritional Supplements (FEEDING SUPPLEMENT, NEPRO CARB STEADY,) LIQD Take 237 mLs by mouth as needed (missed meal during dialysis.). Patient taking differently: Take 60 mLs by mouth 2 (two) times daily.  04/21/15  Yes Geradine Girt, DO  hydrALAZINE (APRESOLINE) 25 MG tablet Take 1  tablet (25 mg total) by mouth 3 (three) times daily as needed (SBP > 150). Patient not taking: Reported on 05/16/2015 04/21/15   Geradine Girt, DO  lactulose (CHRONULAC) 10 GM/15ML solution Take 30 mLs (20 g total) by mouth 2 (two) times daily as needed for moderate constipation. 01/16/15   Samella Parr, NP  levalbuterol Penne Lash) 0.63 MG/3ML nebulizer solution Take 3 mLs (0.63 mg total) by nebulization every 2 (two) hours as needed for wheezing or shortness of breath. Patient not taking: Reported on 05/16/2015 01/16/15   Samella Parr, NP  nitroGLYCERIN (NITROSTAT) 0.4 MG SL tablet Place 1 tablet (0.4 mg total) under the tongue every 5 (five) minutes as needed for chest pain. 11/26/14   Ripudeep Krystal Eaton,  MD   Physical Exam: Filed Vitals:   05/16/15 0703 05/16/15 0730 05/16/15 0802 05/16/15 0833  BP: 102/48 102/48  120/51  Pulse: 77 78  81  Temp:   98.9 F (37.2 C) 98.9 F (37.2 C)  TempSrc:   Oral Oral  Resp:  17    Height:      Weight:      SpO2: 94% 92%  98%     General:  Adult female, NAD, lying in bed, able to sit up and reposition easily  Eyes:  Anicteric, non-injected.  ENT:  Nares clear.  OP clear, non-erythematous without plaques or exudates.  MMM.  Neck:  Supple without TM or JVD.    Lymph:  No cervical, supraclavicular, or submandibular LAD.  Cardiovascular:  RRR, normal S1, S2, referred bruit from left arm.  Warm extremities  Respiratory:  CTA bilaterally without increased WOB.  Abdomen:  NABS.  Soft, ND/NT.    Skin:  No rashes or focal lesions.  Musculoskeletal:  Normal bulk and tone.  No LE edema on right leg.  Left leg with very swollen knee.  I did not remove the ACE wrap and replaced knee immobilizer with complete.  Left foot is still bandaged after her surgery two days ago  Psychiatric:  Alert and able to communicate limited amounts by writing on my arm  Neurologic:  No facial droop, moves all extremities spontaneously, decreased sensation on right foot.   5/5 strength upper and RLE.  LLE not tested due to injury/pain  Labs on Admission:  Basic Metabolic Panel:  Recent Labs Lab 05/16/15 0545  NA 131*  K 4.8  CL 89*  CO2 28  GLUCOSE 90  BUN 31*  CREATININE 4.62*  CALCIUM 8.9   Liver Function Tests: No results for input(s): AST, ALT, ALKPHOS, BILITOT, PROT, ALBUMIN in the last 168 hours. No results for input(s): LIPASE, AMYLASE in the last 168 hours. No results for input(s): AMMONIA in the last 168 hours. CBC:  Recent Labs Lab 05/16/15 0545  WBC 10.9*  NEUTROABS 7.9*  HGB 12.1  HCT 37.2  MCV 92.8  PLT 455*   Cardiac Enzymes: No results for input(s): CKTOTAL, CKMB, CKMBINDEX, TROPONINI in the last 168 hours.  BNP (last 3 results)  Recent Labs  01/13/15 0942 04/18/15 2255  BNP 4164.6* 2821.7*    ProBNP (last 3 results)  Recent Labs  11/19/14 2335 12/12/14 0314  PROBNP 19448.0* 24864.0*    CBG:  Recent Labs Lab 05/14/15 1456  GLUCAP 78    Radiological Exams on Admission: Dg Knee 2 Views Left  05/16/2015   CLINICAL DATA:  Swelling at the left knee, acute onset. Initial encounter.  EXAM: LEFT KNEE - 1-2 VIEW  COMPARISON:  None.  FINDINGS: There is a comminuted fracture involving the lower pole of the patella, with approximately 3 cm of separation and diffuse overlying soft tissue swelling. An associated moderate to large knee joint effusion is noted.  No additional fractures are seen. Mild marginal osteophytes are seen arising at the lateral compartment.  IMPRESSION: Comminuted fracture involving the lower pole of the patella, with approximately 3 cm of separation at the fracture, and diffuse overlying soft tissue swelling. Associated moderate to large knee joint effusion noted.   Electronically Signed   By: Garald Balding M.D.   On: 05/16/2015 03:10   Dg Hip Unilat With Pelvis 2-3 Views Left  05/16/2015   CLINICAL DATA:  Acute onset of left hip pain.  Initial encounter.  EXAM: LEFT  HIP (WITH PELVIS) 2-3  VIEWS  COMPARISON:  None.  FINDINGS: There is no evidence of fracture or dislocation. Both femoral heads are seated normally within their respective acetabula. The proximal left femur appears intact. No significant degenerative change is appreciated. The sacroiliac joints are unremarkable in appearance.  The visualized bowel gas pattern is grossly unremarkable in appearance. Calcified fibroids are noted overlying the pelvis.  IMPRESSION: 1. No evidence of fracture or dislocation. 2. Calcified fibroids noted.   Electronically Signed   By: Garald Balding M.D.   On: 05/16/2015 03:18    EKG:  pending  Assessment/Plan Principal Problem:   Patellar fracture Active Problems:   Essential hypertension   Diabetes mellitus with ESRD (end-stage renal disease)   NSTEMI (non-ST elevated myocardial infarction)   Cardiomyopathy, ischemic-EF 30-35%   FTT (failure to thrive) in adult   ESRD (end stage renal disease)   Diastolic dysfunction-grade 2  ---  Patellar fracture -  Weightbearing as tolerated -  Knee immobilizer -  Orthopedics consultation -  Hydrocodone and tylenol for pain control  Ischemic cardiomyopathy, chronic systolic heart failure with EF 30-35% -  Continue ASA, plavix, BB, and change to high dose statin -  Volume management by HD -  Not a candidate for AICD  History of Sustained VT, currently NSR - sounds regular but ECG pending -  Continue 200 mg amiodarone  ESRD with hyponatremia, elevated creatinine, HD MWF -  Nephrology consultation -  Continue phoslo, renavit   Essential hypertension with low normal BP - discontinue lisinopril and hydralazine since not taking anyway, simplify medication list - place hold parameters for metoprolol  Diabetes mellitus type 2 with renal manifestations, in remission, diet controlled -  A1c 5.4 on 4/25  Recent right forefoot amputation -  Management per orthopedics  Diet:  renal Access:  PIV IVF:  off Proph:  heparin  Code Status:   Full code per nursing notes and son, HPOA Family Communication: patient and her daughter Disposition Plan: Admit to med-surg  Time spent: 60 min Janece Canterbury [facility] Pager [phone]  If 7PM-7AM, please contact night-coverage [email or web address] Password [facility] 05/16/2015, 8:38 AM

## 2015-05-17 ENCOUNTER — Inpatient Hospital Stay (HOSPITAL_COMMUNITY): Payer: Medicare Other | Admitting: Certified Registered Nurse Anesthetist

## 2015-05-17 ENCOUNTER — Encounter (HOSPITAL_COMMUNITY)
Admission: EM | Disposition: A | Discharge status: Skilled Nursing Facility | Payer: Medicare Other | Source: Home / Self Care | Attending: Internal Medicine

## 2015-05-17 ENCOUNTER — Encounter (HOSPITAL_COMMUNITY): Payer: Self-pay | Admitting: Certified Registered Nurse Anesthetist

## 2015-05-17 DIAGNOSIS — S82001A Unspecified fracture of right patella, initial encounter for closed fracture: Secondary | ICD-10-CM

## 2015-05-17 DIAGNOSIS — N186 End stage renal disease: Secondary | ICD-10-CM

## 2015-05-17 DIAGNOSIS — I255 Ischemic cardiomyopathy: Secondary | ICD-10-CM

## 2015-05-17 DIAGNOSIS — E1322 Other specified diabetes mellitus with diabetic chronic kidney disease: Secondary | ICD-10-CM

## 2015-05-17 HISTORY — PX: ORIF PATELLA: SHX5033

## 2015-05-17 LAB — CBC
HCT: 35 % — ABNORMAL LOW (ref 36.0–46.0)
Hemoglobin: 11.1 g/dL — ABNORMAL LOW (ref 12.0–15.0)
MCH: 29.9 pg (ref 26.0–34.0)
MCHC: 31.7 g/dL (ref 30.0–36.0)
MCV: 94.3 fL (ref 78.0–100.0)
Platelets: 504 10*3/uL — ABNORMAL HIGH (ref 150–400)
RBC: 3.71 MIL/uL — ABNORMAL LOW (ref 3.87–5.11)
RDW: 17.6 % — ABNORMAL HIGH (ref 11.5–15.5)
WBC: 8.2 10*3/uL (ref 4.0–10.5)

## 2015-05-17 LAB — RENAL FUNCTION PANEL
Albumin: 3 g/dL — ABNORMAL LOW (ref 3.5–5.0)
Anion gap: 12 (ref 5–15)
BUN: 57 mg/dL — ABNORMAL HIGH (ref 6–20)
CO2: 28 mmol/L (ref 22–32)
Calcium: 8.9 mg/dL (ref 8.9–10.3)
Chloride: 90 mmol/L — ABNORMAL LOW (ref 101–111)
Creatinine, Ser: 6.78 mg/dL — ABNORMAL HIGH (ref 0.44–1.00)
GFR calc Af Amer: 7 mL/min — ABNORMAL LOW (ref >60)
GFR calc non Af Amer: 6 mL/min — ABNORMAL LOW (ref >60)
Glucose, Bld: 124 mg/dL — ABNORMAL HIGH (ref 65–99)
Phosphorus: 5.7 mg/dL — ABNORMAL HIGH (ref 2.5–4.6)
Potassium: 5.1 mmol/L (ref 3.5–5.1)
Sodium: 130 mmol/L — ABNORMAL LOW (ref 135–145)

## 2015-05-17 LAB — POCT I-STAT 4, (NA,K, GLUC, HGB,HCT)
Glucose, Bld: 79 mg/dL (ref 65–99)
Glucose, Bld: 81 mg/dL (ref 65–99)
HCT: 43 % (ref 36.0–46.0)
HCT: 47 % — ABNORMAL HIGH (ref 36.0–46.0)
Hemoglobin: 14.6 g/dL (ref 12.0–15.0)
Hemoglobin: 16 g/dL — ABNORMAL HIGH (ref 12.0–15.0)
Potassium: 4.7 mmol/L (ref 3.5–5.1)
Potassium: 5.9 mmol/L — ABNORMAL HIGH (ref 3.5–5.1)
Sodium: 127 mmol/L — ABNORMAL LOW (ref 135–145)
Sodium: 129 mmol/L — ABNORMAL LOW (ref 135–145)

## 2015-05-17 SURGERY — OPEN REDUCTION INTERNAL FIXATION (ORIF) PATELLA
Anesthesia: General | Laterality: Left

## 2015-05-17 MED ORDER — SODIUM CHLORIDE 0.9 % IJ SOLN
INTRAMUSCULAR | Status: AC
  Filled 2015-05-17: qty 10

## 2015-05-17 MED ORDER — ACETAMINOPHEN 325 MG PO TABS: 650.0000 mg | ORAL_TABLET | Freq: Four times a day (QID) | ORAL | Status: DC | PRN

## 2015-05-17 MED ORDER — METOCLOPRAMIDE HCL 5 MG/ML IJ SOLN: 5.0000 mg | Freq: Three times a day (TID) | INTRAMUSCULAR | Status: DC | PRN

## 2015-05-17 MED ORDER — ALTEPLASE 2 MG IJ SOLR
2.0000 mg | Freq: Once | INTRAMUSCULAR | Status: DC | PRN
  Filled 2015-05-17: qty 2

## 2015-05-17 MED ORDER — PROPOFOL 10 MG/ML IV BOLUS
INTRAVENOUS | Status: AC
  Filled 2015-05-17: qty 20

## 2015-05-17 MED ORDER — ONDANSETRON HCL 4 MG/2ML IJ SOLN: 4.0000 mg | Freq: Four times a day (QID) | INTRAMUSCULAR | Status: DC | PRN

## 2015-05-17 MED ORDER — GLYCOPYRROLATE 0.2 MG/ML IJ SOLN
INTRAMUSCULAR | Status: AC
  Filled 2015-05-17: qty 3

## 2015-05-17 MED ORDER — LIDOCAINE-PRILOCAINE 2.5-2.5 % EX CREA
1.0000 "application " | TOPICAL_CREAM | CUTANEOUS | Status: DC | PRN
  Filled 2015-05-17: qty 5

## 2015-05-17 MED ORDER — ROCURONIUM BROMIDE 50 MG/5ML IV SOLN
INTRAVENOUS | Status: AC
  Filled 2015-05-17: qty 1

## 2015-05-17 MED ORDER — PHENYLEPHRINE 40 MCG/ML (10ML) SYRINGE FOR IV PUSH (FOR BLOOD PRESSURE SUPPORT)
PREFILLED_SYRINGE | INTRAVENOUS | Status: AC
Start: 2015-05-17 — End: 2015-05-17
  Filled 2015-05-17: qty 10

## 2015-05-17 MED ORDER — ONDANSETRON HCL 4 MG PO TABS: 4.0000 mg | ORAL_TABLET | Freq: Four times a day (QID) | ORAL | Status: DC | PRN

## 2015-05-17 MED ORDER — ONDANSETRON HCL 4 MG/2ML IJ SOLN
INTRAMUSCULAR | Status: AC
  Filled 2015-05-17: qty 2

## 2015-05-17 MED ORDER — NEPRO/CARBSTEADY PO LIQD
237.0000 mL | ORAL | Status: DC | PRN
  Filled 2015-05-17: qty 237

## 2015-05-17 MED ORDER — METHOCARBAMOL 500 MG PO TABS
500.0000 mg | ORAL_TABLET | Freq: Four times a day (QID) | ORAL | Status: DC | PRN
  Administered 2015-05-17 – 2015-05-18 (×2): 500 mg via ORAL
  Filled 2015-05-17 (×2): qty 1

## 2015-05-17 MED ORDER — CEFAZOLIN SODIUM-DEXTROSE 2-3 GM-% IV SOLR
INTRAVENOUS | Status: DC | PRN
  Administered 2015-05-17: 2 g via INTRAVENOUS

## 2015-05-17 MED ORDER — METHOCARBAMOL 1000 MG/10ML IJ SOLN: 500.0000 mg | Freq: Four times a day (QID) | INTRAVENOUS | Status: DC | PRN

## 2015-05-17 MED ORDER — ONDANSETRON HCL 4 MG/2ML IJ SOLN
INTRAMUSCULAR | Status: AC
Start: 2015-05-17 — End: 2015-05-17
  Filled 2015-05-17: qty 2

## 2015-05-17 MED ORDER — CEFAZOLIN SODIUM-DEXTROSE 2-3 GM-% IV SOLR
INTRAVENOUS | Status: AC
  Filled 2015-05-17: qty 50

## 2015-05-17 MED ORDER — FENTANYL CITRATE (PF) 250 MCG/5ML IJ SOLN
INTRAMUSCULAR | Status: AC
  Filled 2015-05-17: qty 5

## 2015-05-17 MED ORDER — CHLORHEXIDINE GLUCONATE CLOTH 2 % EX PADS
6.0000 | MEDICATED_PAD | Freq: Every day | CUTANEOUS | Status: DC
  Administered 2015-05-18: 6 via TOPICAL

## 2015-05-17 MED ORDER — PHENYLEPHRINE 40 MCG/ML (10ML) SYRINGE FOR IV PUSH (FOR BLOOD PRESSURE SUPPORT)
PREFILLED_SYRINGE | INTRAVENOUS | Status: AC
  Filled 2015-05-17: qty 10

## 2015-05-17 MED ORDER — ARTIFICIAL TEARS OP OINT
TOPICAL_OINTMENT | OPHTHALMIC | Status: AC
  Filled 2015-05-17: qty 3.5

## 2015-05-17 MED ORDER — SODIUM CHLORIDE 0.9 % IV SOLN: 100.0000 mL | INTRAVENOUS | Status: DC | PRN

## 2015-05-17 MED ORDER — HYDROMORPHONE HCL 1 MG/ML IJ SOLN
INTRAMUSCULAR | Status: AC
  Filled 2015-05-17: qty 1

## 2015-05-17 MED ORDER — DIPHENHYDRAMINE HCL 25 MG PO CAPS: 25.0000 mg | ORAL_CAPSULE | ORAL | Status: DC | PRN

## 2015-05-17 MED ORDER — SUCCINYLCHOLINE CHLORIDE 20 MG/ML IJ SOLN
INTRAMUSCULAR | Status: AC
  Filled 2015-05-17: qty 1

## 2015-05-17 MED ORDER — PHENYLEPHRINE HCL 10 MG/ML IJ SOLN
INTRAMUSCULAR | Status: DC | PRN
  Administered 2015-05-17: 80 ug via INTRAVENOUS

## 2015-05-17 MED ORDER — LIDOCAINE HCL (CARDIAC) 20 MG/ML IV SOLN
INTRAVENOUS | Status: AC
  Filled 2015-05-17: qty 5

## 2015-05-17 MED ORDER — EPHEDRINE SULFATE 50 MG/ML IJ SOLN
INTRAMUSCULAR | Status: AC
  Filled 2015-05-17: qty 1

## 2015-05-17 MED ORDER — FENTANYL CITRATE (PF) 100 MCG/2ML IJ SOLN
INTRAMUSCULAR | Status: DC | PRN
  Administered 2015-05-17: 50 ug via INTRAVENOUS

## 2015-05-17 MED ORDER — HYDROMORPHONE HCL 1 MG/ML IJ SOLN
0.5000 mg | Freq: Once | INTRAMUSCULAR | Status: AC
  Administered 2015-05-17: 0.5 mg via INTRAVENOUS
  Filled 2015-05-17: qty 1

## 2015-05-17 MED ORDER — MIDAZOLAM HCL 2 MG/2ML IJ SOLN
INTRAMUSCULAR | Status: AC
  Filled 2015-05-17: qty 2

## 2015-05-17 MED ORDER — DIPHENHYDRAMINE HCL 25 MG PO CAPS
50.0000 mg | ORAL_CAPSULE | Freq: Once | ORAL | Status: AC
  Administered 2015-05-17: 50 mg via ORAL
  Filled 2015-05-17: qty 2

## 2015-05-17 MED ORDER — MIDAZOLAM HCL 2 MG/2ML IJ SOLN
INTRAMUSCULAR | Status: AC
Start: 2015-05-17 — End: 2015-05-17
  Filled 2015-05-17: qty 2

## 2015-05-17 MED ORDER — METOCLOPRAMIDE HCL 5 MG PO TABS: 5.0000 mg | ORAL_TABLET | Freq: Three times a day (TID) | ORAL | Status: DC | PRN

## 2015-05-17 MED ORDER — ACETAMINOPHEN 650 MG RE SUPP: 650.0000 mg | Freq: Four times a day (QID) | RECTAL | Status: DC | PRN

## 2015-05-17 MED ORDER — ROCURONIUM BROMIDE 50 MG/5ML IV SOLN
INTRAVENOUS | Status: AC
Start: 2015-05-17 — End: 2015-05-17
  Filled 2015-05-17: qty 1

## 2015-05-17 MED ORDER — SODIUM CHLORIDE 0.9 % IV SOLN
INTRAVENOUS | Status: DC
  Administered 2015-05-17 (×2): via INTRAVENOUS

## 2015-05-17 MED ORDER — NEOSTIGMINE METHYLSULFATE 10 MG/10ML IV SOLN
INTRAVENOUS | Status: AC
  Filled 2015-05-17: qty 1

## 2015-05-17 MED ORDER — ONDANSETRON HCL 4 MG/2ML IJ SOLN
INTRAMUSCULAR | Status: DC | PRN
  Administered 2015-05-17: 4 mg via INTRAVENOUS

## 2015-05-17 MED ORDER — HYDROMORPHONE HCL 1 MG/ML IJ SOLN
0.2500 mg | INTRAMUSCULAR | Status: DC | PRN
  Administered 2015-05-17: 0.5 mg via INTRAVENOUS

## 2015-05-17 MED ORDER — PROPOFOL 10 MG/ML IV BOLUS
INTRAVENOUS | Status: AC
Start: 2015-05-17 — End: 2015-05-17
  Filled 2015-05-17: qty 20

## 2015-05-17 MED ORDER — MUPIROCIN 2 % EX OINT
1.0000 "application " | TOPICAL_OINTMENT | Freq: Two times a day (BID) | CUTANEOUS | Status: DC
  Administered 2015-05-18 (×2): 1 via NASAL
  Filled 2015-05-17: qty 22

## 2015-05-17 MED ORDER — 0.9 % SODIUM CHLORIDE (POUR BTL) OPTIME
TOPICAL | Status: DC | PRN
  Administered 2015-05-17: 1000 mL

## 2015-05-17 MED ORDER — SODIUM CHLORIDE 0.9 % IV SOLN: INTRAVENOUS | Status: DC

## 2015-05-17 MED ORDER — CEFAZOLIN SODIUM 1-5 GM-% IV SOLN
1.0000 g | Freq: Four times a day (QID) | INTRAVENOUS | Status: AC
  Administered 2015-05-18 (×3): 1 g via INTRAVENOUS
  Filled 2015-05-17 (×3): qty 50

## 2015-05-17 MED ORDER — PENTAFLUOROPROP-TETRAFLUOROETH EX AERO: 1.0000 "application " | INHALATION_SPRAY | CUTANEOUS | Status: DC | PRN

## 2015-05-17 MED ORDER — PROPOFOL 10 MG/ML IV BOLUS
INTRAVENOUS | Status: DC | PRN
  Administered 2015-05-17: 120 mg via INTRAVENOUS

## 2015-05-17 MED ORDER — LIDOCAINE HCL (PF) 1 % IJ SOLN: 5.0000 mL | INTRAMUSCULAR | Status: DC | PRN

## 2015-05-17 MED ORDER — LIDOCAINE HCL (CARDIAC) 20 MG/ML IV SOLN
INTRAVENOUS | Status: DC | PRN
  Administered 2015-05-17: 40 mg via INTRAVENOUS

## 2015-05-17 SURGICAL SUPPLY — 46 items
BANDAGE ELASTIC 4 VELCRO ST LF (GAUZE/BANDAGES/DRESSINGS) IMPLANT
BANDAGE ELASTIC 6 VELCRO ST LF (GAUZE/BANDAGES/DRESSINGS) IMPLANT
BLADE SURG ROTATE 9660 (MISCELLANEOUS) IMPLANT
BNDG COHESIVE 6X5 TAN STRL LF (GAUZE/BANDAGES/DRESSINGS) ×4 IMPLANT
BNDG GAUZE ELAST 4 BULKY (GAUZE/BANDAGES/DRESSINGS) ×2 IMPLANT
COVER SURGICAL LIGHT HANDLE (MISCELLANEOUS) ×2 IMPLANT
CUFF TOURNIQUET SINGLE 34IN LL (TOURNIQUET CUFF) IMPLANT
CUFF TOURNIQUET SINGLE 44IN (TOURNIQUET CUFF) IMPLANT
DRAPE C-ARM 42X72 X-RAY (DRAPES) IMPLANT
DRSG ADAPTIC 3X8 NADH LF (GAUZE/BANDAGES/DRESSINGS) ×2 IMPLANT
DRSG PAD ABDOMINAL 8X10 ST (GAUZE/BANDAGES/DRESSINGS) ×2 IMPLANT
ELECT REM PT RETURN 9FT ADLT (ELECTROSURGICAL) ×2
ELECTRODE REM PT RTRN 9FT ADLT (ELECTROSURGICAL) ×1 IMPLANT
GLOVE BIOGEL PI IND STRL 9 (GLOVE) ×1 IMPLANT
GLOVE BIOGEL PI INDICATOR 9 (GLOVE) ×1
GLOVE SURG ORTHO 9.0 STRL STRW (GLOVE) ×2 IMPLANT
GOWN STRL REUS W/ TWL XL LVL3 (GOWN DISPOSABLE) ×2 IMPLANT
GOWN STRL REUS W/TWL XL LVL3 (GOWN DISPOSABLE) ×4
KIT BASIN OR (CUSTOM PROCEDURE TRAY) ×2 IMPLANT
KIT ROOM TURNOVER OR (KITS) ×2 IMPLANT
MANIFOLD NEPTUNE II (INSTRUMENTS) IMPLANT
NS IRRIG 1000ML POUR BTL (IV SOLUTION) ×4 IMPLANT
PACK ORTHO EXTREMITY (CUSTOM PROCEDURE TRAY) ×2 IMPLANT
PAD ARMBOARD 7.5X6 YLW CONV (MISCELLANEOUS) ×4 IMPLANT
PAD CAST 4YDX4 CTTN HI CHSV (CAST SUPPLIES) IMPLANT
PADDING CAST COTTON 4X4 STRL (CAST SUPPLIES)
RETRIEVER SUT HEWSON (MISCELLANEOUS) ×2 IMPLANT
SPONGE GAUZE 4X4 12PLY STER LF (GAUZE/BANDAGES/DRESSINGS) ×2 IMPLANT
SPONGE LAP 4X18 X RAY DECT (DISPOSABLE) IMPLANT
STAPLER VISISTAT 35W (STAPLE) ×2 IMPLANT
STOCKINETTE IMPERVIOUS LG (DRAPES) ×2 IMPLANT
SUCTION FRAZIER TIP 10 FR DISP (SUCTIONS) IMPLANT
SUT ETHILON 3 0 FSL (SUTURE) IMPLANT
SUT FIBERWIRE #2 38 REV NDL BL (SUTURE) ×4
SUT STEEL 5 (SUTURE) IMPLANT
SUT VIC AB 0 CT1 27 (SUTURE) ×2
SUT VIC AB 0 CT1 27XBRD ANBCTR (SUTURE) ×1 IMPLANT
SUT VIC AB 2-0 CT1 27 (SUTURE) ×2
SUT VIC AB 2-0 CT1 TAPERPNT 27 (SUTURE) ×1 IMPLANT
SUTURE FIBERWR#2 38 REV NDL BL (SUTURE) ×2 IMPLANT
TOWEL OR 17X24 6PK STRL BLUE (TOWEL DISPOSABLE) ×2 IMPLANT
TOWEL OR 17X26 10 PK STRL BLUE (TOWEL DISPOSABLE) ×2 IMPLANT
TUBE CONNECTING 12X1/4 (SUCTIONS) ×2 IMPLANT
UNDERPAD 30X30 INCONTINENT (UNDERPADS AND DIAPERS) ×2 IMPLANT
WATER STERILE IRR 1000ML POUR (IV SOLUTION) IMPLANT
YANKAUER SUCT BULB TIP NO VENT (SUCTIONS) IMPLANT

## 2015-05-17 NOTE — Op Note (Signed)
05/16/2015 - 05/17/2015  7:25 PM  PATIENT:  [name]    PRE-OPERATIVE DIAGNOSIS:   left patella fracture  POST-OPERATIVE DIAGNOSIS:  Same  PROCEDURE:  OPEN REDUCTION INTERNAL (ORIF) FIXATION PATELLA  SURGEON:  Newt Minion, MD  PHYSICIAN ASSISTANT:None ANESTHESIA:   General  PREOPERATIVE INDICATIONS:  [name] is a  59 y.o. female with a diagnosis of  left patella fracture who failed conservative measures and elected for surgical management.    The risks benefits and alternatives were discussed with the patient preoperatively including but not limited to the risks of infection, bleeding, nerve injury, cardiopulmonary complications, the need for revision surgery, among others, and the patient was willing to proceed.  OPERATIVE IMPLANTS: 2 FiberWire  OPERATIVE FINDINGS: Avulsion off the distal pole of the patella with essentially insufficient bone for bony reconstruction.  OPERATIVE PROCEDURE: Patient was brought to the operating room and underwent a general anesthetic. After adequate levels of anesthesia were obtained patient's left lower extremity was prepped using DuraPrep draped into a sterile field. [name] was used to cover all exposed skin. A timeout was called. A midline incision was made this was carried down the hematoma from within the joint was evacuated. The end of the proximal aspect of the patella tendon was freshened back to healthy viable tissue. Using #2 FiberWire 2 of these were woven through the patellar tendon using Krakw technique. 3 tunnels were then made in the patella and suture passer was used to pass the sutures and the 4 strands were tied over the superior pole the patella. There was a good reconstruction. Patient had good range of motion from 0-90 and there was good stable alignment. The wound was again irrigated with normal saline. Subcutaneous is closed using 2-0 Vicryl. Skin was closed using staples. A sterile compressive dressing was applied. Patient was  placed back in a knee immobilizer she will be strict nonweightbearing left lower extremity due to her midfoot amputation on the left. Patient is safe for discharge to skilled nursing.

## 2015-05-17 NOTE — Transfer of Care (Signed)
Immediate Anesthesia Transfer of Care Note  Patient: [name]  Procedure(s) Performed: Procedure(s): OPEN REDUCTION INTERNAL (ORIF) FIXATION PATELLA (Left)  Patient Location: PACU  Anesthesia Type:General  Level of Consciousness: awake  Airway & Oxygen Therapy: Patient Spontanous Breathing and Patient connected to nasal cannula oxygen  Post-op Assessment: Report given to RN and Post -op Vital signs reviewed and stable  Post vital signs: Reviewed and stable  Last Vitals:  Filed Vitals:   05/17/15 1930  BP: 123/52  Pulse:   Temp: 37 C  Resp:     Complications: No apparent anesthesia complications

## 2015-05-17 NOTE — Anesthesia Procedure Notes (Signed)
Procedure Name: LMA Insertion Date/Time: 05/17/2015 6:40 PM Performed by: Manus Gunning, Salome Cozby J Pre-anesthesia Checklist: Patient identified, Emergency Drugs available, Suction available, Patient being monitored and Timeout performed Patient Re-evaluated:Patient Re-evaluated prior to inductionOxygen Delivery Method: Circle system utilized Preoxygenation: Pre-oxygenation with 100% oxygen LMA: LMA inserted Number of attempts: 1 Placement Confirmation: positive ETCO2 and CO2 detector Tube secured with: Tape Dental Injury: Teeth and Oropharynx as per pre-operative assessment

## 2015-05-17 NOTE — Progress Notes (Signed)
Patient ID: [name], female   DOB: February 07, 1956, 59 y.o.   MRN: [id number] Avulsion of the distal pole of the patella left knee. We'll plan for add-on surgery this evening for reconstruction left patella.

## 2015-05-17 NOTE — Anesthesia Postprocedure Evaluation (Signed)
  Anesthesia Post-op Note  Patient: [name]  Procedure(s) Performed: Procedure(s): OPEN REDUCTION INTERNAL (ORIF) FIXATION PATELLA (Left)  Patient Location: PACU  Anesthesia Type:General  Level of Consciousness: awake, alert  and oriented  Airway and Oxygen Therapy: Patient Spontanous Breathing and Patient connected to nasal cannula oxygen  Post-op Pain: mild  Post-op Assessment: Post-op Vital signs reviewed, Patient's Cardiovascular Status Stable, Respiratory Function Stable, Patent Airway and Pain level controlled  Post-op Vital Signs: stable  Last Vitals:  Filed Vitals:   05/17/15 2039  BP: 125/50  Pulse: 85  Temp: 37.2 C  Resp: 15    Complications: No apparent anesthesia complications

## 2015-05-17 NOTE — Procedures (Signed)
I was present at this dialysis session. I have reviewed the session itself and made appropriate changes.   3L UF goal.  Notes reveiwed. RS  Pearson Grippe  MD 05/17/2015, 2:36 PM

## 2015-05-17 NOTE — H&P (View-Only) (Signed)
Patient ID: [name], female   DOB: 1956-02-19, 59 y.o.   MRN: [id number] Avulsion of the distal pole of the patella left knee. We'll plan for add-on surgery this evening for reconstruction left patella.

## 2015-05-17 NOTE — Progress Notes (Signed)
[facility] PROGRESS NOTE  [name] [id number] DOB: 1956/07/10 DOA: 05/16/2015 PCP: [name], DO  Assessment/Plan: Patellar fracture - Weightbearing as tolerated - Knee immobilizer - Dr.Duda plans surgery for reconstruction of patella - Hydrocodone and tylenol for pain control  Ischemic cardiomyopathy, chronic systolic heart failure with EF 30-35% - Continue ASA, plavix, BB, and change to high dose statin - Volume management by HD - Not a candidate for AICD  History of Sustained VT, currently NSR - Continue 200 mg amiodarone  ESRD with hyponatremia, elevated creatinine, HD MWF - Nephrology following, HD today prior to OR - Continue phoslo, renavit   Essential hypertension  -BP soft but stable - held lisinopril and hydralazine  - continue metoprolol  Diabetes mellitus type 2 with renal manifestations, in remission, diet controlled - A1c 5.4 on 4/25  Recent right forefoot amputation - Management per orthopedics  DVT Proph: heparin  Code Status: Full Code Family Communication: none at bedside Disposition Plan: back to SNF when stable   Consultants:  Renal  Ortho Dr.Duda  HPI/Subjective: Resting comfortably in bed, was unable to communicate with patient  Objective: Filed Vitals:   05/17/15 1430  BP: 112/64  Pulse: 80  Temp:   Resp:     Intake/Output Summary (Last 24 hours) at 05/17/15 1439 Last data filed at 05/16/15 2258  Gross per 24 hour  Intake    320 ml  Output      0 ml  Net    320 ml   Filed Weights   05/16/15 0103 05/17/15 0440 05/17/15 1137  Weight: 81.647 kg (180 lb) 77.066 kg (169 lb 14.4 oz) 77.3 kg (170 lb 6.7 oz)    Exam:   General:  Alert, awake, smiling  Cardiovascular: S1S2/RRR  Respiratory: CTAB  Abdomen: soft, NT, BS present  Musculoskeletal: L foot bandaged, L knee with immobilizer, i did not open dressing  Data Reviewed: Basic Metabolic Panel:  Recent Labs Lab 05/14/15 1257  05/14/15 1312 05/16/15 0545 05/17/15 0433  NA 127* 129* 131* 130*  K 5.9* 4.7 4.8 5.1  CL  --   --  89* 90*  CO2  --   --  28 28  GLUCOSE 79 81 90 124*  BUN  --   --  31* 57*  CREATININE  --   --  4.62* 6.78*  CALCIUM  --   --  8.9 8.9  PHOS  --   --   --  5.7*   Liver Function Tests:  Recent Labs Lab 05/17/15 0433  ALBUMIN 3.0*   No results for input(s): LIPASE, AMYLASE in the last 168 hours. No results for input(s): AMMONIA in the last 168 hours. CBC:  Recent Labs Lab 05/14/15 1257 05/14/15 1312 05/16/15 0545 05/17/15 0433  WBC  --   --  10.9* 8.2  NEUTROABS  --   --  7.9*  --   HGB 16.0* 14.6 12.1 11.1*  HCT 47.0* 43.0 37.2 35.0*  MCV  --   --  92.8 94.3  PLT  --   --  455* 504*   Cardiac Enzymes: No results for input(s): CKTOTAL, CKMB, CKMBINDEX, TROPONINI in the last 168 hours. BNP (last 3 results)  Recent Labs  01/13/15 0942 04/18/15 2255  BNP 4164.6* 2821.7*    ProBNP (last 3 results)  Recent Labs  11/19/14 2335 12/12/14 0314  PROBNP 19448.0* 24864.0*    CBG:  Recent Labs Lab 05/14/15 1456  GLUCAP 78    No results found for this  or any previous visit (from the past 240 hour(s)).   Studies: Dg Knee 2 Views Left  05/16/2015   CLINICAL DATA:  Swelling at the left knee, acute onset. Initial encounter.  EXAM: LEFT KNEE - 1-2 VIEW  COMPARISON:  None.  FINDINGS: There is a comminuted fracture involving the lower pole of the patella, with approximately 3 cm of separation and diffuse overlying soft tissue swelling. An associated moderate to large knee joint effusion is noted.  No additional fractures are seen. Mild marginal osteophytes are seen arising at the lateral compartment.  IMPRESSION: Comminuted fracture involving the lower pole of the patella, with approximately 3 cm of separation at the fracture, and diffuse overlying soft tissue swelling. Associated moderate to large knee joint effusion noted.   Electronically Signed   By: Garald Balding  M.D.   On: 05/16/2015 03:10   Dg Hip Unilat With Pelvis 2-3 Views Left  05/16/2015   CLINICAL DATA:  Acute onset of left hip pain.  Initial encounter.  EXAM: LEFT HIP (WITH PELVIS) 2-3 VIEWS  COMPARISON:  None.  FINDINGS: There is no evidence of fracture or dislocation. Both femoral heads are seated normally within their respective acetabula. The proximal left femur appears intact. No significant degenerative change is appreciated. The sacroiliac joints are unremarkable in appearance.  The visualized bowel gas pattern is grossly unremarkable in appearance. Calcified fibroids are noted overlying the pelvis.  IMPRESSION: 1. No evidence of fracture or dislocation. 2. Calcified fibroids noted.   Electronically Signed   By: Garald Balding M.D.   On: 05/16/2015 03:18    Scheduled Meds: . acetaminophen  650 mg Oral TID  . amiodarone  200 mg Oral Daily  . aspirin EC  81 mg Oral Daily  . atorvastatin  40 mg Oral Daily  . calcium acetate  667 mg Oral TID WC  . clopidogrel  75 mg Oral Daily  . docusate sodium  100 mg Oral BID  . feeding supplement (PRO-STAT SUGAR FREE 64)  30 mL Oral TID WC  . HYDROmorphone      . metoprolol succinate  50 mg Oral Daily  . multivitamin with minerals  1 tablet Oral Daily   Continuous Infusions:  Antibiotics Given (last 72 hours)    None      Principal Problem:   Patellar fracture Active Problems:   Essential hypertension   Diabetes mellitus with ESRD (end-stage renal disease)   NSTEMI (non-ST elevated myocardial infarction)   Cardiomyopathy, ischemic-EF 30-35%   FTT (failure to thrive) in adult   ESRD (end stage renal disease)   Diastolic dysfunction-grade 2    Time spent: 58min    Jaquay Posthumus  [facility] Pager [phone]. If 7PM-7AM, please contact night-coverage at [email or web address], password [email or web address] 05/17/2015, 2:39 PM  LOS: 1 day

## 2015-05-17 NOTE — Anesthesia Preprocedure Evaluation (Addendum)
Anesthesia Evaluation  Patient identified by MRN, date of birth, ID band Patient awake    Reviewed: Allergy & Precautions, H&P , NPO status , Patient's Chart, lab work & pertinent test results, reviewed documented beta blocker date and time   Airway Mallampati: II  TM Distance: >3 FB Neck ROM: Full    Dental no notable dental hx. (+) Teeth Intact   Pulmonary neg pulmonary ROS,  breath sounds clear to auscultation  Pulmonary exam normal       Cardiovascular hypertension, Pt. on medications and Pt. on home beta blockers + CAD, + Past MI, + Peripheral Vascular Disease and +CHF Rhythm:Regular Rate:Normal     Neuro/Psych negative neurological ROS     GI/Hepatic negative GI ROS, Neg liver ROS,   Endo/Other  negative endocrine ROSdiabetes  Renal/GU ESRF and DialysisRenal disease  negative genitourinary   Musculoskeletal   Abdominal (+) + obese,   Peds  Hematology negative hematology ROS (+)   Anesthesia Other Findings   Reproductive/Obstetrics negative OB ROS                            Anesthesia Physical Anesthesia Plan  ASA: IV  Anesthesia Plan: General   Post-op Pain Management:    Induction: Intravenous  Airway Management Planned: LMA  Additional Equipment:   Intra-op Plan:   Post-operative Plan: Extubation in OR  Informed Consent: I have reviewed the patients History and Physical, chart, labs and discussed the procedure including the risks, benefits and alternatives for the proposed anesthesia with the patient or authorized representative who has indicated his/her understanding and acceptance.   Dental advisory given  Plan Discussed with: CRNA  Anesthesia Plan Comments:         Anesthesia Quick Evaluation

## 2015-05-17 NOTE — Interval H&P Note (Signed)
History and Physical Interval Note:  05/17/2015 4:56 PM  [name]  has presented today for surgery, with the diagnosis of  left patella fracture  The various methods of treatment have been discussed with the patient and family. After consideration of risks, benefits and other options for treatment, the patient has consented to  Procedure(s): OPEN REDUCTION INTERNAL (ORIF) FIXATION PATELLA (Left) as a surgical intervention .  The patient's history has been reviewed, patient examined, no change in status, stable for surgery.  I have reviewed the patient's chart and labs.  Questions were answered to the patient's satisfaction.     [name] V

## 2015-05-17 NOTE — Clinical Social Work Note (Signed)
Clinical Social Work Assessment  Patient Details  Name: [name] MRN: [id number] Date of Birth: [birth year]  Date of referral:  05/17/15               Reason for consult:  Facility Placement, Discharge Planning, Other (Comment Required) (Admitted from facility.)                Permission sought to share information with:  Family Supports [name], [phone] [name] / Patient's son)) Permission granted to share information::   (Patient in surgery. Patient deaf/blind.)  Name::     [name] [facility]) / [name] (patient's sister)  Agency::     Relationship::  Son/HCPOA and sister  Contact Information:  [name] [phone])  Housing/Transportation Living arrangements for the past 2 months:  [place] of Information:  [place], Other (Comment Required) (HCPOA requested CSW speak with patient's sister.) Patient Interpreter Needed:  Sign Language, Other (Comment Required) (Patient deaf/blind. Understands sign language in patient's palm of her hand.) Criminal Activity/Legal Involvement Pertinent to Current Situation/Hospitalization:  No - Comment as needed Significant Relationships:  Adult Children, Siblings, Spouse Lives with:  Facility Resident ([place]) Do you feel safe going back to the place where you live?  No (High fall risk. Patient's family requesting new placement.) Need for family participation in patient care:  Yes (Comment)  Care giving concerns:  Patient's sister expressed concern regarding patient's care at current SNF. Patient's family requesting new SNF search as family would prefer for patient to be placed at another SNF. No other needs addressed at this time.   Social Worker assessment / plan:  CSW received referral stating patient admitted for [employer]. CSW attempted to speak with patient's son/HCPOA as patient in surgery. CSW left message for patient's son/HCPOA. CSW  received return call from patient's sister, [name], stating patient's son/HCPOA requested CSW speak with patient's sister as patient's son/HCPOA is currently occupied at work. Per patient's sister, patient's family would prefer for patient to be discharged to another SNF at discharge as family has safety concerns with patient's current living arrangements. Patient's sister understanding that patient may have to return [facility] if no other SNF beds available. Patient's sister understanding and agreeable.  Employment status:  Disabled (Comment on whether or not currently receiving Disability) Insurance information:  Medicare PT Recommendations:  [place] / Referral to community resources:  [place]  Patient/Family's Response to care:  Patient's family understanding and agreeable to CSW plan of care.  Patient/Family's Understanding of and Emotional Response to Diagnosis, Current Treatment, and Prognosis:  Patient's family understanding and agreeable to CSW plan of care.  Emotional Assessment Appearance:  Other (Comment Required (Patient in surgery, spoke with patient's family.) Attitude/Demeanor/Rapport:  Other (Patient in surgery, spoke with patient's family.) Affect (typically observed):  Other (Patient in surgery, spoke with patient's family.) Orientation:  Oriented to Self, Oriented to Place, Oriented to Situation Alcohol / Substance use:  Not Applicable Psych involvement (Current and /or in the community):  No (Comment) (Not appropriate on this admission.)  Discharge Needs  Concerns to be addressed:  No discharge needs identified Readmission within the last 30 days:  No Current discharge risk:  None Barriers to Discharge:  No Barriers Identified   Caroline Sauger, LCSW 05/17/2015, 3:57 PM [phone]

## 2015-05-18 ENCOUNTER — Encounter (HOSPITAL_COMMUNITY): Payer: Self-pay | Admitting: Orthopedic Surgery

## 2015-05-18 DIAGNOSIS — I10 Essential (primary) hypertension: Secondary | ICD-10-CM | POA: Diagnosis not present

## 2015-05-18 DIAGNOSIS — F29 Unspecified psychosis not due to a substance or known physiological condition: Secondary | ICD-10-CM | POA: Diagnosis not present

## 2015-05-18 DIAGNOSIS — I509 Heart failure, unspecified: Secondary | ICD-10-CM | POA: Diagnosis not present

## 2015-05-18 DIAGNOSIS — I120 Hypertensive chronic kidney disease with stage 5 chronic kidney disease or end stage renal disease: Secondary | ICD-10-CM | POA: Diagnosis present

## 2015-05-18 DIAGNOSIS — E119 Type 2 diabetes mellitus without complications: Secondary | ICD-10-CM | POA: Diagnosis not present

## 2015-05-18 DIAGNOSIS — J209 Acute bronchitis, unspecified: Secondary | ICD-10-CM | POA: Diagnosis not present

## 2015-05-18 DIAGNOSIS — E785 Hyperlipidemia, unspecified: Secondary | ICD-10-CM | POA: Diagnosis not present

## 2015-05-18 DIAGNOSIS — I5032 Chronic diastolic (congestive) heart failure: Secondary | ICD-10-CM | POA: Diagnosis not present

## 2015-05-18 DIAGNOSIS — N189 Chronic kidney disease, unspecified: Secondary | ICD-10-CM | POA: Diagnosis not present

## 2015-05-18 DIAGNOSIS — R262 Difficulty in walking, not elsewhere classified: Secondary | ICD-10-CM | POA: Diagnosis not present

## 2015-05-18 DIAGNOSIS — I739 Peripheral vascular disease, unspecified: Secondary | ICD-10-CM | POA: Diagnosis not present

## 2015-05-18 DIAGNOSIS — Z89432 Acquired absence of left foot: Secondary | ICD-10-CM | POA: Diagnosis not present

## 2015-05-18 DIAGNOSIS — R627 Adult failure to thrive: Secondary | ICD-10-CM | POA: Diagnosis not present

## 2015-05-18 DIAGNOSIS — I472 Ventricular tachycardia: Secondary | ICD-10-CM | POA: Diagnosis not present

## 2015-05-18 DIAGNOSIS — Z79891 Long term (current) use of opiate analgesic: Secondary | ICD-10-CM | POA: Diagnosis not present

## 2015-05-18 DIAGNOSIS — N2581 Secondary hyperparathyroidism of renal origin: Secondary | ICD-10-CM | POA: Diagnosis present

## 2015-05-18 DIAGNOSIS — Z7902 Long term (current) use of antithrombotics/antiplatelets: Secondary | ICD-10-CM | POA: Diagnosis not present

## 2015-05-18 DIAGNOSIS — F209 Schizophrenia, unspecified: Secondary | ICD-10-CM | POA: Diagnosis not present

## 2015-05-18 DIAGNOSIS — I70208 Unspecified atherosclerosis of native arteries of extremities, other extremity: Secondary | ICD-10-CM | POA: Diagnosis present

## 2015-05-18 DIAGNOSIS — I5022 Chronic systolic (congestive) heart failure: Secondary | ICD-10-CM | POA: Diagnosis present

## 2015-05-18 DIAGNOSIS — E1122 Type 2 diabetes mellitus with diabetic chronic kidney disease: Secondary | ICD-10-CM | POA: Diagnosis present

## 2015-05-18 DIAGNOSIS — I252 Old myocardial infarction: Secondary | ICD-10-CM | POA: Diagnosis not present

## 2015-05-18 DIAGNOSIS — H9190 Unspecified hearing loss, unspecified ear: Secondary | ICD-10-CM | POA: Diagnosis not present

## 2015-05-18 DIAGNOSIS — S82009A Unspecified fracture of unspecified patella, initial encounter for closed fracture: Secondary | ICD-10-CM | POA: Diagnosis not present

## 2015-05-18 DIAGNOSIS — I2510 Atherosclerotic heart disease of native coronary artery without angina pectoris: Secondary | ICD-10-CM | POA: Diagnosis present

## 2015-05-18 DIAGNOSIS — D649 Anemia, unspecified: Secondary | ICD-10-CM | POA: Diagnosis present

## 2015-05-18 DIAGNOSIS — E1322 Other specified diabetes mellitus with diabetic chronic kidney disease: Secondary | ICD-10-CM | POA: Diagnosis not present

## 2015-05-18 DIAGNOSIS — Z7982 Long term (current) use of aspirin: Secondary | ICD-10-CM | POA: Diagnosis not present

## 2015-05-18 DIAGNOSIS — Z992 Dependence on renal dialysis: Secondary | ICD-10-CM | POA: Diagnosis not present

## 2015-05-18 DIAGNOSIS — M898X9 Other specified disorders of bone, unspecified site: Secondary | ICD-10-CM | POA: Diagnosis present

## 2015-05-18 DIAGNOSIS — D509 Iron deficiency anemia, unspecified: Secondary | ICD-10-CM | POA: Diagnosis not present

## 2015-05-18 DIAGNOSIS — I255 Ischemic cardiomyopathy: Secondary | ICD-10-CM | POA: Diagnosis not present

## 2015-05-18 DIAGNOSIS — Z7382 Dual sensory impairment: Secondary | ICD-10-CM | POA: Diagnosis not present

## 2015-05-18 DIAGNOSIS — Z79899 Other long term (current) drug therapy: Secondary | ICD-10-CM | POA: Diagnosis not present

## 2015-05-18 DIAGNOSIS — F4320 Adjustment disorder, unspecified: Secondary | ICD-10-CM | POA: Diagnosis not present

## 2015-05-18 DIAGNOSIS — F22 Delusional disorders: Secondary | ICD-10-CM | POA: Diagnosis not present

## 2015-05-18 DIAGNOSIS — K5909 Other constipation: Secondary | ICD-10-CM | POA: Diagnosis not present

## 2015-05-18 DIAGNOSIS — I4891 Unspecified atrial fibrillation: Secondary | ICD-10-CM | POA: Diagnosis present

## 2015-05-18 DIAGNOSIS — H540 Blindness, both eyes: Secondary | ICD-10-CM | POA: Diagnosis not present

## 2015-05-18 DIAGNOSIS — E1129 Type 2 diabetes mellitus with other diabetic kidney complication: Secondary | ICD-10-CM | POA: Diagnosis not present

## 2015-05-18 DIAGNOSIS — D631 Anemia in chronic kidney disease: Secondary | ICD-10-CM | POA: Diagnosis not present

## 2015-05-18 DIAGNOSIS — S82002A Unspecified fracture of left patella, initial encounter for closed fracture: Secondary | ICD-10-CM | POA: Diagnosis not present

## 2015-05-18 DIAGNOSIS — S82002D Unspecified fracture of left patella, subsequent encounter for closed fracture with routine healing: Secondary | ICD-10-CM | POA: Diagnosis not present

## 2015-05-18 DIAGNOSIS — M868X7 Other osteomyelitis, ankle and foot: Secondary | ICD-10-CM | POA: Diagnosis present

## 2015-05-18 DIAGNOSIS — N186 End stage renal disease: Secondary | ICD-10-CM | POA: Diagnosis not present

## 2015-05-18 DIAGNOSIS — L97519 Non-pressure chronic ulcer of other part of right foot with unspecified severity: Secondary | ICD-10-CM | POA: Diagnosis not present

## 2015-05-18 DIAGNOSIS — S82892D Other fracture of left lower leg, subsequent encounter for closed fracture with routine healing: Secondary | ICD-10-CM | POA: Diagnosis not present

## 2015-05-18 DIAGNOSIS — M19042 Primary osteoarthritis, left hand: Secondary | ICD-10-CM | POA: Diagnosis not present

## 2015-05-18 DIAGNOSIS — H913 Deaf nonspeaking, not elsewhere classified: Secondary | ICD-10-CM | POA: Diagnosis not present

## 2015-05-18 DIAGNOSIS — F419 Anxiety disorder, unspecified: Secondary | ICD-10-CM | POA: Diagnosis present

## 2015-05-18 LAB — POCT I-STAT 4, (NA,K, GLUC, HGB,HCT)
Glucose, Bld: 114 mg/dL — ABNORMAL HIGH (ref 65–99)
HCT: 40 % (ref 36.0–46.0)
Hemoglobin: 13.6 g/dL (ref 12.0–15.0)
Potassium: 4.4 mmol/L (ref 3.5–5.1)
Sodium: 134 mmol/L — ABNORMAL LOW (ref 135–145)

## 2015-05-18 LAB — RENAL FUNCTION PANEL
Albumin: 3 g/dL — ABNORMAL LOW (ref 3.5–5.0)
Anion gap: 15 (ref 5–15)
BUN: 34 mg/dL — ABNORMAL HIGH (ref 6–20)
CO2: 24 mmol/L (ref 22–32)
Calcium: 8.9 mg/dL (ref 8.9–10.3)
Chloride: 95 mmol/L — ABNORMAL LOW (ref 101–111)
Creatinine, Ser: 5.34 mg/dL — ABNORMAL HIGH (ref 0.44–1.00)
GFR calc Af Amer: 9 mL/min — ABNORMAL LOW (ref >60)
GFR calc non Af Amer: 8 mL/min — ABNORMAL LOW (ref >60)
Glucose, Bld: 91 mg/dL (ref 65–99)
Phosphorus: 6.7 mg/dL — ABNORMAL HIGH (ref 2.5–4.6)
Potassium: 5.1 mmol/L (ref 3.5–5.1)
Sodium: 134 mmol/L — ABNORMAL LOW (ref 135–145)

## 2015-05-18 LAB — GLUCOSE, CAPILLARY
Glucose-Capillary: 106 mg/dL — ABNORMAL HIGH (ref 65–99)
Glucose-Capillary: 110 mg/dL — ABNORMAL HIGH (ref 65–99)

## 2015-05-18 LAB — HEPATITIS B SURFACE ANTIGEN: Hepatitis B Surface Ag: NEGATIVE

## 2015-05-18 LAB — CBC
HCT: 36 % (ref 36.0–46.0)
Hemoglobin: 11.3 g/dL — ABNORMAL LOW (ref 12.0–15.0)
MCH: 29.8 pg (ref 26.0–34.0)
MCHC: 31.4 g/dL (ref 30.0–36.0)
MCV: 95 fL (ref 78.0–100.0)
Platelets: 485 10*3/uL — ABNORMAL HIGH (ref 150–400)
RBC: 3.79 MIL/uL — ABNORMAL LOW (ref 3.87–5.11)
RDW: 18 % — ABNORMAL HIGH (ref 11.5–15.5)
WBC: 9 10*3/uL (ref 4.0–10.5)

## 2015-05-18 MED ORDER — SENNOSIDES-DOCUSATE SODIUM 8.6-50 MG PO TABS: 1.0000 | ORAL_TABLET | Freq: Two times a day (BID) | ORAL | Status: DC

## 2015-05-18 MED ORDER — CALCIUM ACETATE (PHOS BINDER) 667 MG PO CAPS
1334.0000 mg | ORAL_CAPSULE | Freq: Three times a day (TID) | ORAL | Status: DC
  Administered 2015-05-18: 1334 mg via ORAL
  Filled 2015-05-18: qty 2

## 2015-05-18 NOTE — Progress Notes (Signed)
[facility] contacted. Remained on hold for 7 minutes. PACU arriving. Expecting a call back.

## 2015-05-18 NOTE — Progress Notes (Signed)
[facility] discharged SNF [facility]) per MD order. Son notified. Copy of instructions and scripts sent with patient. IV removed.  Patient escorted to car by PTAR. No distress noted upon discharge.   Esaw Dace 05/18/2015 3:26 PM

## 2015-05-18 NOTE — Discharge Instructions (Signed)
Wear her knee immobilizer at all times. Weightbearing as tolerated.

## 2015-05-18 NOTE — Progress Notes (Signed)
[facility] Progress Note  Assessment/Plan: 1. Left patellar fracture - s/p ORIF 5/23 Dr. Sharol Given - pt to be d/c back to [facility] today WB as tolerated wearing knee immobilizer - f/u Sharol Given 2 weeks 2. ESRD - MWF  Net UF 3 L Monday -post weight 74.7- K today is 5.1 the same as yesterday ) and Cr only decreased from 6.78 to 5.34 - concerned that she may have AVF flow issues - will check AF Wed at dialysis and reassess kinetics then. 3. Hypertension/volume - controlled - weights here significantly below edw - with weight including immobilizer - reassess Wed a outpt unit.- doubt she has lost that much weight - in fact knee immobilizer adds some extra weight 4. Anemia - Hgb 12.1down to 11.3 post op - no ESA for now - last Mircera was 225 5/18 - due for redose 6/1- next dose needs to be titrated down if not held depending on Hgb 5. Metabolic bone disease - Continue Hectorol 1 P 6.7 - ^ phoslo to 2 ac 6. Nutrition - renal carb mod diet Alb 3 7. Recent left transmet due to osteo 5/20 Dr. Sharol Given 8. CAD / chron systolic CHF - EF [id number] 9. Disp - d/c today to SNF 10. MRSA PCR + 4/25 - be sure she is on contact precautions at outpt HD unit at d/c  Myriam Jacobson, PA-C [place] [phone] 05/18/2015,8:57 AM  LOS: 2 days   Subjective:   No new issues -   Objective Filed Vitals:   05/17/15 2000 05/17/15 2039 05/18/15 0520 05/18/15 0551  BP: 143/54 125/50 144/56   Pulse: 88 85 81   Temp: 99 F (37.2 C) 99 F (37.2 C) 98.6 F (37 C)   TempSrc: Oral Oral Oral   Resp: 9 15 17    Height:      Weight:    75.8 kg (167 lb 1.7 oz)  SpO2: 100% 100% 100%    Physical Exam General:calm, NAD, staff starting to feed breakfast Heart: RRR Lungs: clear Abdomen: soft Extremities: left transmet wrapped; left knee in immobilizer/ice pack Dialysis Access: left upper AVF + bruit  Dialysis Orders: Center: [facility] MWF 180 4.25 hr EDW 78.5 2 K 2.5 Ca left upper AVF Heparin  9000, Mircera 225 q 2 weeks-given 5/18, No Fe, Hecotorol 1,  Recent labs: Hgb 10.6 5/18 tsa 8% 03/2015 ferritin was 1700s - received venofer 100 x 2 in April  Additional Objective Labs: Basic Metabolic Panel:  Recent Labs Lab 05/16/15 0545 05/17/15 0433 05/18/15 0530  NA 131* 130* 134*  K 4.8 5.1 5.1  CL 89* 90* 95*  CO2 28 28 24   GLUCOSE 90 124* 91  BUN 31* 57* 34*  CREATININE 4.62* 6.78* 5.34*  CALCIUM 8.9 8.9 8.9  PHOS  --  5.7* 6.7*   Liver Function Tests:  Recent Labs Lab 05/17/15 0433 05/18/15 0530  ALBUMIN 3.0* 3.0*   CBC:  Recent Labs Lab 05/16/15 0545 05/17/15 0433 05/18/15 0530  WBC 10.9* 8.2 9.0  NEUTROABS 7.9*  --   --   HGB 12.1 11.1* 11.3*  HCT 37.2 35.0* 36.0  MCV 92.8 94.3 95.0  PLT 455* 504* 485*   Blood Culture    Component Value Date/Time   SDES BLOOD RIGHT HAND 01/13/2015 1550   SPECREQUEST BOTTLES DRAWN AEROBIC ONLY 10CC 01/13/2015 1550   CULT  01/13/2015 1550    NO GROWTH 5 DAYS Performed at [place] 01/20/2015 FINAL 01/13/2015  1550    CBG:  Recent Labs Lab 05/14/15 1456  GLUCAP 78  Medications: . sodium chloride 10 mL/hr at 05/17/15 1809  . sodium chloride     . acetaminophen  650 mg Oral TID  . amiodarone  200 mg Oral Daily  . aspirin EC  81 mg Oral Daily  . atorvastatin  40 mg Oral Daily  . calcium acetate  667 mg Oral TID WC  .  ceFAZolin (ANCEF) IV  1 g Intravenous Q6H  . Chlorhexidine Gluconate Cloth  6 each Topical Q0600  . clopidogrel  75 mg Oral Daily  . docusate sodium  100 mg Oral BID  . feeding supplement (PRO-STAT SUGAR FREE 64)  30 mL Oral TID WC  . metoprolol succinate  50 mg Oral Daily  . multivitamin with minerals  1 tablet Oral Daily  . mupirocin ointment  1 application Nasal BID

## 2015-05-18 NOTE — Evaluation (Signed)
Physical Therapy Evaluation Patient Details Name: [name] MRN: [id number] DOB: 07/01/1956 Today's Date: 05/18/2015   History of Present Illness  Patient is a 59 y/o female s/p ORIF left patella after falling out of bed at the nursing facility. PMH of HTN, HLD, deaf, blind, DM, CAD, psychiatric disorder, MI, ESRD on HD and heart murmur.    Clinical Impression  Patient presents with pain, generalized weakness and balance deficits s/p above surgery impacting mobility. Education provided on [employer] precautions and KI. Pt required use of interpreter secondary to impairments. Required Max-Mod A for bed mobility and transfers. Increased time to perform all mobility due to language barrier. Pt from [facility] and appropriate to return there with PT to improve transfers, balance and overall mobility to maximize independence and ease burden of care.    Follow Up Recommendations SNF;Supervision/Assistance - 24 hour    Equipment Recommendations  None recommended by PT    Recommendations for Other Services OT consult     Precautions / Restrictions Precautions Precautions: Fall Required Braces or Orthoses: Knee Immobilizer - Left Knee Immobilizer - Left: On at all times Restrictions Weight Bearing Restrictions: Yes LLE Weight Bearing: Weight bearing as tolerated      Mobility  Bed Mobility Overal bed mobility: Needs Assistance Bed Mobility: Supine to Sit     Supine to sit: Mod assist;HOB elevated     General bed mobility comments: Mod A to bring LLE to EOB and elevate trunk.   Transfers Overall transfer level: Needs assistance Equipment used: Rolling walker (2 wheeled) Transfers: Sit to/from Stand Sit to Stand: Max assist;From elevated surface         General transfer comment: Max A to rise from EOB with manual cues for hand placement. Unsteady upon standing.   Ambulation/Gait Ambulation/Gait assistance: Min assist Ambulation Distance (Feet): 3 Feet Assistive device:  Rolling walker (2 wheeled) Gait Pattern/deviations: Step-to pattern;Decreased stride length;Trunk flexed   Gait velocity interpretation: Below normal speed for age/gender General Gait Details: Able to shuffle a few feet to chair with assist with RW negotiation and directional cues.   Stairs            Wheelchair Mobility    Modified Rankin (Stroke Patients Only)       Balance Overall balance assessment: Needs assistance Sitting-balance support: Feet supported;No upper extremity supported Sitting balance-Leahy Scale: Fair     Standing balance support: During functional activity Standing balance-Leahy Scale: Poor Standing balance comment: Relient on RW and external support for assist.                              Pertinent Vitals/Pain Pain Assessment: Faces Faces Pain Scale: Hurts whole lot Pain Location: LLE with movement Pain Descriptors / Indicators: Sore;Aching;Sharp Pain Intervention(s): Limited activity within patient's tolerance;Monitored during session;Repositioned    Home Living Family/patient expects to be discharged to:: Skilled nursing facility                      Prior Function Level of Independence: Needs assistance   Gait / Transfers Assistance Needed: Per representative from [facility], pt total A for ADLs. Requires assist to transfer to w/c and Mod I for w/c mobility. Pt is non ambulatory.     Comments: Per     Hand Dominance   Dominant Hand: Right    Extremity/Trunk Assessment   Upper Extremity Assessment: Defer to OT evaluation  Lower Extremity Assessment: Generalized weakness;LLE deficits/detail   LLE Deficits / Details: Amputation of met heads.     Communication   Communication: Deaf;Interpreter utilized Juliann Pulse from [employer], #[id number])  Cognition Arousal/Alertness: Awake/alert Behavior During Therapy: WFL for tasks assessed/performed Overall Cognitive  Status: Within Functional Limits for tasks assessed                      General Comments      Exercises        Assessment/Plan    PT Assessment Patient needs continued PT services  PT Diagnosis Difficulty walking;Acute pain;Generalized weakness   PT Problem List Decreased strength;Pain;Decreased activity tolerance;Decreased balance;Decreased mobility;Decreased knowledge of precautions;Decreased safety awareness  PT Treatment Interventions Balance training;Gait training;DME instruction;Functional mobility training;Therapeutic activities;Therapeutic exercise;Patient/family education   PT Goals (Current goals can be found in the Care Plan section) Acute Rehab PT Goals Patient Stated Goal: to get out of this bed PT Goal Formulation: With patient Time For Goal Achievement: 06/01/15 Potential to Achieve Goals: Fair    Frequency Min 3X/week   Barriers to discharge        Co-evaluation               End of Session Equipment Utilized During Treatment: Gait belt;Left knee immobilizer Activity Tolerance: Patient tolerated treatment well;Patient limited by pain Patient left: in chair;with call bell/phone within reach;with family/visitor present Nurse Communication: Mobility status;Weight bearing status         Time: 6415-8309 PT Time Calculation (min) (ACUTE ONLY): 42 min   Charges:   PT Evaluation $Initial PT Evaluation Tier I: 1 Procedure PT Treatments $Therapeutic Activity: 23-37 mins   PT G Codes:        Miley Blanchett A Hadlie Gipson 05/18/2015, 3:19 PM Wray Kearns, [place], DPT [phone]

## 2015-05-18 NOTE — Progress Notes (Signed)
Patient ID: [name], female   DOB: Feb 09, 1956, 59 y.o.   MRN: [id number] Postoperative day 1 status post reconstruction left patella fracture. Patient is safe for discharge back to skilled nursing. She may be weightbearing as tolerated she has to wear the knee immobilizer at all times. I will follow-up in the office in 2 weeks.

## 2015-05-18 NOTE — Discharge Planning (Signed)
Patient to return to [facility]. Patient's son, [name], updated.  Facility: [facility] RN report number: [phone] Transportation: EMS ([address])  Lubertha Sayres, [place] [phone]) and Surgical [phone])

## 2015-05-18 NOTE — Discharge Summary (Signed)
Physician Discharge Summary  [name] [id number] DOB: November 02, 1956 DOA: 05/16/2015  PCP: [name], DO  Admit date: 05/16/2015 Discharge date: 05/18/2015  Time spent: 45 minutes  Recommendations for Outpatient Follow-up:  1. Dr.Duda in 2 weeks  Discharge Diagnoses:  Principal Problem:   Patellar fracture   Deaf   Blindness   Essential hypertension   Diabetes mellitus with ESRD (end-stage renal disease)   NSTEMI (non-ST elevated myocardial infarction)   Cardiomyopathy, ischemic-EF 30-35%   FTT (failure to thrive) in adult   ESRD (end stage renal disease)   Diastolic dysfunction-grade 2   CAD   left forefoot resection on 5/20   Discharge Condition:stable  Diet recommendation: Renal  Filed Weights   05/17/15 1137 05/17/15 1537 05/18/15 0551  Weight: 77.3 kg (170 lb 6.7 oz) 74.7 kg (164 lb 10.9 oz) 75.8 kg (167 lb 1.7 oz)    History of present illness:  Chief Complaint: Knee pain HPI: The patient is a 59 y.o. year-old deaf and blind female with history of diabetes mellitus type 2, chronic systolic heart failure with EF 30%, coronary artery disease, sustained VT, ESRD on HD normally MWF, anemia of renaldisease who presented with right knee pain. The patient was admitted in January with bronchitis and last month with hypertensive urgency and acute diastolic heart failure. She underwent left forefoot resection on 5/20 by Dr. 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 She was discharged to [facility], however, she fell out of bed on 5/21 and developed increased pain in swelling in her left leg which necessitated transfer to ER.   Hospital Course:   Patellar fracture -s/p reconstruction left patella fracture 5/23 by Dr.Duda - Weightbearing as tolerated and Knee immobilizer at all times recommended per Dr.Duda - Hydrocodone and tylenol for pain control -tolerated surgery well without any post op  complications -stable for discharge per Dr.Duda-FU in 2 weeks in office  Deaf and Blind -we communicate with patient by writing on her hand -also sign interpreter used   Ischemic cardiomyopathy, chronic systolic heart failure with EF 30-35% - Continue ASA, plavix, BB, and changed to high dose statin - Volume management by HD - Not a candidate for AICD  History of Sustained VT, currently NSR - Continue 200 mg amiodarone  ESRD with hyponatremia, elevated creatinine, HD MWF - Nephrology following, s/p HD yetserday - Continue phoslo, renavit   Essential hypertension  -BP soft but stable - held lisinopril and hydralazine, could be resumed depending on trend after discharge - continue metoprolol  Diabetes mellitus type 2 with renal manifestations, in remission, diet controlled - A1c 5.4 on 4/25  Recent right forefoot amputation - Management per orthopedics -dressing changes and wound care as done previously prior to admission  Procedures: PROCEDURE: OPEN REDUCTION INTERNAL (ORIF) FIXATION PATELLA 5/23  Consultations:  Dr.Duda  Discharge Exam: Filed Vitals:   05/18/15 0520  BP: 144/56  Pulse: 81  Temp: 98.6 F (37 C)  Resp: 17    General: Alert, awake, no distress Cardiovascular: S1S2/RRR Respiratory: CTAB  Discharge Instructions   Discharge Instructions    Discharge instructions    Complete by:  As directed   Renal Diet     Increase activity slowly    Complete by:  As directed      Weight bearing as tolerated    Complete by:  As directed   Laterality:  left  Extremity:  Lower  Wear a knee immobilizer at all times.  Current Discharge Medication List    START taking these medications   Details  senna-docusate (SENOKOT-S) 8.6-50 MG per tablet Take 1 tablet by mouth 2 (two) times daily.      CONTINUE these medications which have NOT CHANGED   Details  acetaminophen (TYLENOL) 325 MG tablet Take 650 mg by mouth 3 (three) times daily.      albuterol (PROVENTIL) (2.5 MG/3ML) 0.083% nebulizer solution Take 2.5 mg by nebulization every 2 (two) hours as needed for wheezing or shortness of breath.    Amino Acids-Protein Hydrolys (FEEDING SUPPLEMENT, PRO-STAT SUGAR FREE 64,) LIQD Take 30 mLs by mouth 3 (three) times daily with meals. Qty: 900 mL, Refills: 0    amiodarone (PACERONE) 200 MG tablet Take 200 mg by mouth daily.    aspirin 81 MG tablet Take 81 mg by mouth daily. For hypertension    atorvastatin (LIPITOR) 10 MG tablet Take 10 mg by mouth daily.    calcium acetate (PHOSLO) 667 MG capsule Take 1 capsule (667 mg total) by mouth 3 (three) times daily with meals. Qty: 30 capsule, Refills: 1    clopidogrel (PLAVIX) 75 MG tablet Take 1 tablet (75 mg total) by mouth daily.    guaiFENesin (MUCINEX) 600 MG 12 hr tablet Take 1 tablet (600 mg total) by mouth 2 (two) times daily.    HYDROcodone-acetaminophen (NORCO) 5-325 MG per tablet Take 1 tablet by mouth every 6 (six) hours as needed. Qty: 15 tablet, Refills: 0    metoprolol succinate (TOPROL-XL) 50 MG 24 hr tablet Take 1 tablet (50 mg total) by mouth daily. Take with or immediately following a meal.    Multiple Vitamin (MULTIVITAMIN WITH MINERALS) TABS tablet Take 2 tablets by mouth daily.    Nutritional Supplements (FEEDING SUPPLEMENT, NEPRO CARB STEADY,) LIQD Take 237 mLs by mouth as needed (missed meal during dialysis.). Refills: 0    lactulose (CHRONULAC) 10 GM/15ML solution Take 30 mLs (20 g total) by mouth 2 (two) times daily as needed for moderate constipation. Qty: 240 mL, Refills: 0    nitroGLYCERIN (NITROSTAT) 0.4 MG SL tablet Place 1 tablet (0.4 mg total) under the tongue every 5 (five) minutes as needed for chest pain. Refills: 12      STOP taking these medications     Docusate Sodium (DSS) 100 MG CAPS        No Known Allergies Follow-up Information    Follow up with DUDA,MARCUS V, MD In 2 weeks.   Specialty:  Orthopedic Surgery   Contact  information:   [place] [address] [phone]       Follow up with [name], DO. Schedule an appointment as soon as possible for a visit in 1 week.   Specialty:  Internal Medicine   Contact information:   [place] [address] [phone]        The results of significant diagnostics from this hospitalization (including imaging, microbiology, ancillary and laboratory) are listed below for reference.    Significant Diagnostic Studies: Dg Knee 2 Views Left  05/16/2015   CLINICAL DATA:  Swelling at the left knee, acute onset. Initial encounter.  EXAM: LEFT KNEE - 1-2 VIEW  COMPARISON:  None.  FINDINGS: There is a comminuted fracture involving the lower pole of the patella, with approximately 3 cm of separation and diffuse overlying soft tissue swelling. An associated moderate to large knee joint effusion is noted.  No additional fractures are seen. Mild marginal osteophytes are seen arising  at the lateral compartment.  IMPRESSION: Comminuted fracture involving the lower pole of the patella, with approximately 3 cm of separation at the fracture, and diffuse overlying soft tissue swelling. Associated moderate to large knee joint effusion noted.   Electronically Signed   By: Garald Balding M.D.   On: 05/16/2015 03:10   Dg Chest Port 1 View  04/20/2015   CLINICAL DATA:  Shortness of breath and CHF.  EXAM: PORTABLE CHEST - 1 VIEW  COMPARISON:  None.  FINDINGS: Stable chronic cardiomegaly. Pulmonary vascularity is mildly increased. There is mild diffuse interstitial prominence bilaterally. Small focal opacity in the right upper lung field appears new compared to prior studies and most likely reflects atelectasis or focal airspace disease related edema.  No visible pleural effusion with portable technique.  IMPRESSION: Mild congestive heart failure suspected.  Small focal opacity right upper lung field favored to be due to atelectasis or early airspace  disease secondary to edema. Suggest attention on follow-up.   Electronically Signed   By: Curlene Dolphin M.D.   On: 04/20/2015 11:34   Dg Chest Portable 1 View  04/19/2015   CLINICAL DATA:  Code ST-elevation myocardial infarction. Chest pain and shortness of breath. Initial encounter.  EXAM: PORTABLE CHEST - 1 VIEW  COMPARISON:  Chest radiograph performed 01/14/2015  FINDINGS: The lungs are well-aerated. Vascular congestion is noted, with mildly increased interstitial markings, raising concern for mild pulmonary edema. A small left pleural effusion is suspected. No pneumothorax is seen.  The cardiomediastinal silhouette is mildly enlarged. No acute osseous abnormalities are seen.  IMPRESSION: Vascular congestion and mild cardiomegaly, with mildly increased interstitial markings, raising concern for mild pulmonary edema. Suspect small left pleural effusion.   Electronically Signed   By: Garald Balding M.D.   On: 04/19/2015 00:17   Dg Hip Unilat With Pelvis 2-3 Views Left  05/16/2015   CLINICAL DATA:  Acute onset of left hip pain.  Initial encounter.  EXAM: LEFT HIP (WITH PELVIS) 2-3 VIEWS  COMPARISON:  None.  FINDINGS: There is no evidence of fracture or dislocation. Both femoral heads are seated normally within their respective acetabula. The proximal left femur appears intact. No significant degenerative change is appreciated. The sacroiliac joints are unremarkable in appearance.  The visualized bowel gas pattern is grossly unremarkable in appearance. Calcified fibroids are noted overlying the pelvis.  IMPRESSION: 1. No evidence of fracture or dislocation. 2. Calcified fibroids noted.   Electronically Signed   By: Garald Balding M.D.   On: 05/16/2015 03:18    Microbiology: No results found for this or any previous visit (from the past 240 hour(s)).   Labs: Basic Metabolic Panel:  Recent Labs Lab 05/14/15 1257 05/14/15 1312 05/16/15 0545 05/17/15 0433 05/18/15 0530  NA 127* 129* 131* 130* 134*   K 5.9* 4.7 4.8 5.1 5.1  CL  --   --  89* 90* 95*  CO2  --   --  28 28 24   GLUCOSE 79 81 90 124* 91  BUN  --   --  31* 57* 34*  CREATININE  --   --  4.62* 6.78* 5.34*  CALCIUM  --   --  8.9 8.9 8.9  PHOS  --   --   --  5.7* 6.7*   Liver Function Tests:  Recent Labs Lab 05/17/15 0433 05/18/15 0530  ALBUMIN 3.0* 3.0*   No results for input(s): LIPASE, AMYLASE in the last 168 hours. No results for input(s): AMMONIA in the last 168 hours.  CBC:  Recent Labs Lab 05/14/15 1257 05/14/15 1312 05/16/15 0545 05/17/15 0433 05/18/15 0530  WBC  --   --  10.9* 8.2 9.0  NEUTROABS  --   --  7.9*  --   --   HGB 16.0* 14.6 12.1 11.1* 11.3*  HCT 47.0* 43.0 37.2 35.0* 36.0  MCV  --   --  92.8 94.3 95.0  PLT  --   --  455* 504* 485*   Cardiac Enzymes: No results for input(s): CKTOTAL, CKMB, CKMBINDEX, TROPONINI in the last 168 hours. BNP: BNP (last 3 results)  Recent Labs  01/13/15 0942 04/18/15 2255  BNP 4164.6* 2821.7*    ProBNP (last 3 results)  Recent Labs  11/19/14 2335 12/12/14 0314  PROBNP 19448.0* 24864.0*    CBG:  Recent Labs Lab 05/14/15 1456  GLUCAP 78       Signed:  Marquell Saenz  [facility] 05/18/2015, 10:19 AM

## 2015-05-20 ENCOUNTER — Encounter: Payer: Self-pay | Admitting: Internal Medicine

## 2015-05-20 ENCOUNTER — Non-Acute Institutional Stay (SKILLED_NURSING_FACILITY): Payer: Medicare Other | Admitting: Internal Medicine

## 2015-05-20 DIAGNOSIS — I5032 Chronic diastolic (congestive) heart failure: Secondary | ICD-10-CM | POA: Diagnosis not present

## 2015-05-20 DIAGNOSIS — Z992 Dependence on renal dialysis: Secondary | ICD-10-CM | POA: Diagnosis not present

## 2015-05-20 DIAGNOSIS — I10 Essential (primary) hypertension: Secondary | ICD-10-CM | POA: Diagnosis not present

## 2015-05-20 DIAGNOSIS — F22 Delusional disorders: Secondary | ICD-10-CM | POA: Diagnosis not present

## 2015-05-20 DIAGNOSIS — N186 End stage renal disease: Secondary | ICD-10-CM

## 2015-05-20 DIAGNOSIS — S82002A Unspecified fracture of left patella, initial encounter for closed fracture: Secondary | ICD-10-CM

## 2015-05-20 DIAGNOSIS — I739 Peripheral vascular disease, unspecified: Secondary | ICD-10-CM | POA: Diagnosis not present

## 2015-05-20 NOTE — Progress Notes (Signed)
Patient ID: [name], female   DOB: 03-Sep-1956, 59 y.o.   MRN: [id number]    HISTORY AND PHYSICAL   DATE: 05/20/15  Location:  [place] [facility] Service: SNF [phone])   Extended Emergency Contact Information Primary Emergency Contact: Alfred,[name] Address: [address], [place] [address] [place] Phone: [phone] Relation: Spouse Secondary Emergency Contact: [name] of [place] Phone: [phone] Mobile Phone: [phone] Relation: Sister  Advanced Directive information  FULL CODE  Chief Complaint  Patient presents with  . Readmit To SNF    HPI:  59 yo female [facility] term resident seen today as a readmission into SNF following hospital stay for left patellar fx due to fall s/p repair. She underwent ORIF of left patella on 5/23rd by Dr Sharol Given. Pain was treated with hydrocodone and tylenol.  She is nonverbal but communicates minimally by writing on forearm with her finger. She states pain controlled. She feels hot and sweaty some times. Appetite is excellent. Sleeping well. No falls since readmission. She is a poor historian due to nonverbal. Hx obtained from chart  HTN - stable on toprol xl  ESRD/HD - stable; HD on MWF. She takes phoslo  Deaf/blind/mute - communicates with interpreter and writing on forearm with finger  DM - CBGs stable. Last A1c 5.4%. She is diet controlled  CHF - EF 30-35%. Not candidate for AICD. Stable with HD and taking ASA, lipitor, and toprol xl. 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 Pain controlled with hydrocodone  She takes vitamins and nutritional supplements also. Uses lactulose prn constipation  Past Medical History  Diagnosis Date  . Hyperlipidemia   . Hypertension     a. Has previously refused blood pressure meds.     . Deaf     USE SIGN INTERPRETER.  Divorced from husband but lives with him. Has daughter but she does not care for her.  . Bilateral leg edema     a. Chronic.  Marland Kitchen Psychiatric disorder     She frequently exhibits paranoia and has been diagnosed with psychotic d/o NOS during hospital stay in the past. She is tangetial and perseverative during her visits. She apparently has had a bad experience with mental health in [place] in the past and refuses to discuss mental issues for fear that she will be sent back there. Her paranoia, communication issues, financial woes and lack of fam  . Fibroid uterus   . Anemia     Due to fibroids.  . Heart murmur   . Diabetes mellitus     a. Per PCP note 2012 (A1C 9.2) - pt unwilling to take meds and was educated on risk of uncontrolled DM.  b. A1C 5.9 in 11/2013.  Marland Kitchen Coronary artery disease   . Blind   . ESRD on hemodialysis     Mon, Wed, Fri  . CHF (congestive heart failure)   . MI (myocardial infarction) 11/2014    Past Surgical History  Procedure Laterality Date  . No past surgeries    . Insertion of dialysis catheter Right 01/02/2014    Procedure: INSERTION OF DIALYSIS CATHETER;  Surgeon: Rosetta Posner, MD;  Location: [place];  Service: Vascular;  Laterality: Right;  . Av fistula placement Left 01/02/2014  Procedure: ARTERIOVENOUS (AV) FISTULA CREATION;  Surgeon: Rosetta Posner, MD;  Location: [facility];  Service: Vascular;  Laterality: Left;  . Amputation Bilateral 04/02/2015    Procedure: Amputation Bilateral Great Toes MTP Joint;  Surgeon: Newt Minion, MD;  Location: [facility];  Service: Orthopedics;  Laterality: Bilateral;  . Amputation Left 05/14/2015    Procedure: Left Transmetatarsal Amputation;  Surgeon: Newt Minion, MD;  Location: [facility];  Service: Orthopedics;  Laterality: Left;  . Orif patella Left 05/17/2015    Procedure: OPEN REDUCTION INTERNAL (ORIF) FIXATION PATELLA;  Surgeon: Newt Minion, MD;  Location: [facility];  Service: Orthopedics;  Laterality: Left;     Patient Care Team: [name], DO as PCP - General (Internal Medicine)  History   Social History  . Marital Status: Married    Spouse Name: N/A  . Number of Children: N/A  . Years of Education: N/A   Occupational History  . Not on file.   Social History Main Topics  . Smoking status: Never Smoker   . Smokeless tobacco: Not on file     Comment: Prior dip  . Alcohol Use: No  . Drug Use: No  . Sexual Activity: Not on file   Other Topics Concern  . Not on file   Social History Narrative     reports that she has never smoked. She does not have any smokeless tobacco history on file. She reports that she does not drink alcohol or use illicit drugs.  Family History  Problem Relation Age of Onset  . Other Father     Drowned from fishing?  . Heart disease     Family Status  Relation Status Death Age  . Mother Deceased     Unknown  . Father Deceased     Immunization History  Administered Date(s) Administered  . Influenza,inj,Quad PF,36+ Mos 01/03/2014  . Influenza-Unspecified 09/24/2014  . PPD Test 07/28/2014  . Pneumococcal Polysaccharide-23 01/03/2014    No Known Allergies  Medications: Patient's Medications  New Prescriptions   No medications on file  Previous Medications   ACETAMINOPHEN (TYLENOL) 325 MG TABLET    Take 650 mg by mouth 3 (three) times daily.    ALBUTEROL (PROVENTIL) (2.5 MG/3ML) 0.083% NEBULIZER SOLUTION    Take 2.5 mg by nebulization every 2 (two) hours as needed for wheezing or shortness of breath.   AMINO ACIDS-PROTEIN HYDROLYS (FEEDING SUPPLEMENT, PRO-STAT SUGAR FREE 64,) LIQD    Take 30 mLs by mouth 3 (three) times daily with meals.   AMIODARONE (PACERONE) 200 MG TABLET    Take 200 mg by mouth daily.   ASPIRIN 81 MG TABLET    Take 81 mg by mouth daily. For hypertension   ATORVASTATIN (LIPITOR) 10 MG TABLET    Take 10 mg by mouth daily.   CALCIUM ACETATE (PHOSLO) 667 MG CAPSULE    Take 1 capsule (667 mg total) by mouth 3 (three)  times daily with meals.   CLOPIDOGREL (PLAVIX) 75 MG TABLET    Take 1 tablet (75 mg total) by mouth daily.   GUAIFENESIN (MUCINEX) 600 MG 12 HR TABLET    Take 1 tablet (600 mg total) by mouth 2 (two) times daily.   HYDROCODONE-ACETAMINOPHEN (NORCO) 5-325 MG PER TABLET    Take 1 tablet by mouth every 6 (six) hours as needed.   LACTULOSE (CHRONULAC) 10 GM/15ML SOLUTION    Take 30 mLs (20 g total) by mouth 2 (two) times daily as needed for moderate constipation.  METOPROLOL SUCCINATE (TOPROL-XL) 50 MG 24 HR TABLET    Take 1 tablet (50 mg total) by mouth daily. Take with or immediately following a meal.   MULTIPLE VITAMIN (MULTIVITAMIN WITH MINERALS) TABS TABLET    Take 2 tablets by mouth daily.   NITROGLYCERIN (NITROSTAT) 0.4 MG SL TABLET    Place 1 tablet (0.4 mg total) under the tongue every 5 (five) minutes as needed for chest pain.   NUTRITIONAL SUPPLEMENTS (FEEDING SUPPLEMENT, NEPRO CARB STEADY,) LIQD    Take 237 mLs by mouth as needed (missed meal during dialysis.).   SENNA-DOCUSATE (SENOKOT-S) 8.6-50 MG PER TABLET    Take 1 tablet by mouth 2 (two) times daily.  Modified Medications   No medications on file  Discontinued Medications   No medications on file    Review of Systems  Unable to perform ROS: Patient nonverbal    Filed Vitals:   05/20/15 1739  BP: 129/74  Pulse: 74  Temp: 98 F (36.7 C)   There is no weight on file to calculate BMI.  Physical Exam  Constitutional: She appears well-developed.  Frail appearing in NAD. Sitting in bed  HENT:  Mouth/Throat: Oropharynx is clear and moist. No oropharyngeal exudate.  Eyes: No scleral icterus.  OU lens clouding  Neck: Neck supple. Carotid bruit is not present. No tracheal deviation present.  Cardiovascular: Normal rate, regular rhythm and intact distal pulses.  Exam reveals no gallop and no friction rub.   Murmur (1/6 SEM) heard. Left arm AVF with thrill palpable and audible bruit  Pulmonary/Chest: Effort normal and breath  sounds normal. No stridor. No respiratory distress. She has no wheezes. She has no rales.  Abdominal: Soft. Bowel sounds are normal. She exhibits no distension and no mass. There is no hepatomegaly. There is no tenderness. There is no rebound and no guarding.  Musculoskeletal: She exhibits edema and tenderness.  Left knee immobilizer intact and distal right foot wrapped  Lymphadenopathy:    She has no cervical adenopathy.  Neurological: She is alert.  Skin: Skin is warm and dry. No rash noted.  Psychiatric: Her behavior is normal. Her mood appears anxious.     Labs reviewed: Admission on 05/16/2015, Discharged on 05/18/2015  Component Date Value Ref Range Status  . Sodium 05/16/2015 131* 135 - 145 mmol/L Final  . Potassium 05/16/2015 4.8  3.5 - 5.1 mmol/L Final  . Chloride 05/16/2015 89* 101 - 111 mmol/L Final  . CO2 05/16/2015 28  22 - 32 mmol/L Final  . Glucose, Bld 05/16/2015 90  65 - 99 mg/dL Final  . BUN 05/16/2015 31* 6 - 20 mg/dL Final  . Creatinine, Ser 05/16/2015 4.62* 0.44 - 1.00 mg/dL Final  . Calcium 05/16/2015 8.9  8.9 - 10.3 mg/dL Final  . GFR calc non Af Amer 05/16/2015 10* >60 mL/min Final  . GFR calc Af Amer 05/16/2015 11* >60 mL/min Final   Comment: (NOTE) The eGFR has been calculated using the CKD EPI equation. This calculation has not been validated in all clinical situations. eGFR's persistently <60 mL/min signify possible Chronic Kidney Disease.   . Anion gap 05/16/2015 14  5 - 15 Final  . WBC 05/16/2015 10.9* 4.0 - 10.5 K/uL Final  . RBC 05/16/2015 4.01  3.87 - 5.11 MIL/uL Final  . Hemoglobin 05/16/2015 12.1  12.0 - 15.0 g/dL Final  . HCT 05/16/2015 37.2  36.0 - 46.0 % Final  . MCV 05/16/2015 92.8  78.0 - 100.0 fL Final  . MCH 05/16/2015 30.2  26.0 - 34.0 pg Final  . MCHC 05/16/2015 32.5  30.0 - 36.0 g/dL Final  . RDW 05/16/2015 17.1* 11.5 - 15.5 % Final  . Platelets 05/16/2015 455* 150 - 400 K/uL Final  . Neutrophils Relative % 05/16/2015 72  43 - 77 %  Final  . Neutro Abs 05/16/2015 7.9* 1.7 - 7.7 K/uL Final  . Lymphocytes Relative 05/16/2015 9* 12 - 46 % Final  . Lymphs Abs 05/16/2015 1.0  0.7 - 4.0 K/uL Final  . Monocytes Relative 05/16/2015 16* 3 - 12 % Final  . Monocytes Absolute 05/16/2015 1.7* 0.1 - 1.0 K/uL Final  . Eosinophils Relative 05/16/2015 2  0 - 5 % Final  . Eosinophils Absolute 05/16/2015 0.2  0.0 - 0.7 K/uL Final  . Basophils Relative 05/16/2015 1  0 - 1 % Final  . Basophils Absolute 05/16/2015 0.1  0.0 - 0.1 K/uL Final  . WBC 05/17/2015 8.2  4.0 - 10.5 K/uL Final  . RBC 05/17/2015 3.71* 3.87 - 5.11 MIL/uL Final  . Hemoglobin 05/17/2015 11.1* 12.0 - 15.0 g/dL Final  . HCT 05/17/2015 35.0* 36.0 - 46.0 % Final  . MCV 05/17/2015 94.3  78.0 - 100.0 fL Final  . MCH 05/17/2015 29.9  26.0 - 34.0 pg Final  . MCHC 05/17/2015 31.7  30.0 - 36.0 g/dL Final  . RDW 05/17/2015 17.6* 11.5 - 15.5 % Final  . Platelets 05/17/2015 504* 150 - 400 K/uL Final  . Sodium 05/17/2015 130* 135 - 145 mmol/L Final  . Potassium 05/17/2015 5.1  3.5 - 5.1 mmol/L Final  . Chloride 05/17/2015 90* 101 - 111 mmol/L Final  . CO2 05/17/2015 28  22 - 32 mmol/L Final  . Glucose, Bld 05/17/2015 124* 65 - 99 mg/dL Final  . BUN 05/17/2015 57* 6 - 20 mg/dL Final  . Creatinine, Ser 05/17/2015 6.78* 0.44 - 1.00 mg/dL Final  . Calcium 05/17/2015 8.9  8.9 - 10.3 mg/dL Final  . Phosphorus 05/17/2015 5.7* 2.5 - 4.6 mg/dL Final  . Albumin 05/17/2015 3.0* 3.5 - 5.0 g/dL Final  . GFR calc non Af Amer 05/17/2015 6* >60 mL/min Final  . GFR calc Af Amer 05/17/2015 7* >60 mL/min Final   Comment: (NOTE) The eGFR has been calculated using the CKD EPI equation. This calculation has not been validated in all clinical situations. eGFR's persistently <60 mL/min signify possible Chronic Kidney Disease.   . Anion gap 05/17/2015 12  5 - 15 Final  . Hepatitis B Surface Ag 05/17/2015 NEGATIVE  NEGATIVE Final   Performed at [facility]  . WBC 05/18/2015 9.0  4.0 -  10.5 K/uL Final  . RBC 05/18/2015 3.79* 3.87 - 5.11 MIL/uL Final  . Hemoglobin 05/18/2015 11.3* 12.0 - 15.0 g/dL Final  . HCT 05/18/2015 36.0  36.0 - 46.0 % Final  . MCV 05/18/2015 95.0  78.0 - 100.0 fL Final  . MCH 05/18/2015 29.8  26.0 - 34.0 pg Final  . MCHC 05/18/2015 31.4  30.0 - 36.0 g/dL Final  . RDW 05/18/2015 18.0* 11.5 - 15.5 % Final  . Platelets 05/18/2015 485* 150 - 400 K/uL Final  . Sodium 05/18/2015 134* 135 - 145 mmol/L Final  . Potassium 05/18/2015 5.1  3.5 - 5.1 mmol/L Final  . Chloride 05/18/2015 95* 101 - 111 mmol/L Final  . CO2 05/18/2015 24  22 - 32 mmol/L Final  . Glucose, Bld 05/18/2015 91  65 - 99 mg/dL Final  . BUN 05/18/2015 34* 6 - 20 mg/dL Final  .  Creatinine, Ser 05/18/2015 5.34* 0.44 - 1.00 mg/dL Final  . Calcium 05/18/2015 8.9  8.9 - 10.3 mg/dL Final  . Phosphorus 05/18/2015 6.7* 2.5 - 4.6 mg/dL Final  . Albumin 05/18/2015 3.0* 3.5 - 5.0 g/dL Final  . GFR calc non Af Amer 05/18/2015 8* >60 mL/min Final  . GFR calc Af Amer 05/18/2015 9* >60 mL/min Final   Comment: (NOTE) The eGFR has been calculated using the CKD EPI equation. This calculation has not been validated in all clinical situations. eGFR's persistently <60 mL/min signify possible Chronic Kidney Disease.   . Anion gap 05/18/2015 15  5 - 15 Final  . Glucose-Capillary 05/17/2015 106* 65 - 99 mg/dL Final  . Comment 1 05/17/2015 Repeat Test   Final  . Comment 2 05/17/2015 Document in Chart   Final  . Glucose-Capillary 05/17/2015 110* 65 - 99 mg/dL Final  . Comment 1 05/17/2015 Notify RN   Final  . Sodium 05/17/2015 134* 135 - 145 mmol/L Final  . Potassium 05/17/2015 4.4  3.5 - 5.1 mmol/L Final  . Glucose, Bld 05/17/2015 114* 65 - 99 mg/dL Final  . HCT 05/17/2015 40.0  36.0 - 46.0 % Final  . Hemoglobin 05/17/2015 13.6  12.0 - 15.0 g/dL Final  Admission on 05/14/2015, Discharged on 05/14/2015  Component Date Value Ref Range Status  . aPTT 05/14/2015 29  24 - 37 seconds Final   SLIGHT  HEMOLYSIS  . Prothrombin Time 05/14/2015 15.2  11.6 - 15.2 seconds Final   SLIGHT HEMOLYSIS  . INR 05/14/2015 1.18  0.00 - 1.49 Final   SLIGHT HEMOLYSIS  . Glucose-Capillary 05/14/2015 78  65 - 99 mg/dL Final  . Sodium 05/14/2015 129* 135 - 145 mmol/L Final  . Potassium 05/14/2015 4.7  3.5 - 5.1 mmol/L Final  . Glucose, Bld 05/14/2015 81  65 - 99 mg/dL Final  . HCT 05/14/2015 43.0  36.0 - 46.0 % Final  . Hemoglobin 05/14/2015 14.6  12.0 - 15.0 g/dL Final  . Sodium 05/14/2015 127* 135 - 145 mmol/L Final  . Potassium 05/14/2015 5.9* 3.5 - 5.1 mmol/L Final  . Glucose, Bld 05/14/2015 79  65 - 99 mg/dL Final  . HCT 05/14/2015 47.0* 36.0 - 46.0 % Final  . Hemoglobin 05/14/2015 16.0* 12.0 - 15.0 g/dL Final  Admission on 04/18/2015, Discharged on 04/21/2015  Component Date Value Ref Range Status  . Sodium 04/18/2015 123* 135 - 145 mmol/L Final  . Potassium 04/18/2015 7.3* 3.5 - 5.1 mmol/L Final  . Chloride 04/18/2015 90* 96 - 112 mmol/L Final  . BUN 04/18/2015 89* 6 - 23 mg/dL Final  . Creatinine, Ser 04/18/2015 6.50* 0.50 - 1.10 mg/dL Final  . Glucose, Bld 04/18/2015 216* 70 - 99 mg/dL Final  . Calcium, Ion 04/18/2015 1.00* 1.12 - 1.23 mmol/L Final  . TCO2 04/18/2015 24  0 - 100 mmol/L Final  . Hemoglobin 04/18/2015 14.3  12.0 - 15.0 g/dL Final  . HCT 04/18/2015 42.0  36.0 - 46.0 % Final  . Comment 04/18/2015 NOTIFIED PHYSICIAN   Final  . WBC 04/18/2015 14.4* 4.0 - 10.5 K/uL Final  . RBC 04/18/2015 3.71* 3.87 - 5.11 MIL/uL Final  . Hemoglobin 04/18/2015 11.5* 12.0 - 15.0 g/dL Final  . HCT 04/18/2015 35.4* 36.0 - 46.0 % Final  . MCV 04/18/2015 95.4  78.0 - 100.0 fL Final  . MCH 04/18/2015 31.0  26.0 - 34.0 pg Final  . MCHC 04/18/2015 32.5  30.0 - 36.0 g/dL Final  . RDW 04/18/2015 16.6* 11.5 -  15.5 % Final  . Platelets 04/18/2015 409* 150 - 400 K/uL Final  . Neutrophils Relative % 04/18/2015 85* 43 - 77 % Final  . Neutro Abs 04/18/2015 12.1* 1.7 - 7.7 K/uL Final  . Lymphocytes Relative  04/18/2015 8* 12 - 46 % Final  . Lymphs Abs 04/18/2015 1.2  0.7 - 4.0 K/uL Final  . Monocytes Relative 04/18/2015 6  3 - 12 % Final  . Monocytes Absolute 04/18/2015 0.9  0.1 - 1.0 K/uL Final  . Eosinophils Relative 04/18/2015 1  0 - 5 % Final  . Eosinophils Absolute 04/18/2015 0.2  0.0 - 0.7 K/uL Final  . Basophils Relative 04/18/2015 0  0 - 1 % Final  . Basophils Absolute 04/18/2015 0.1  0.0 - 0.1 K/uL Final  . Troponin i, poc 04/18/2015 0.04  0.00 - 0.08 ng/mL Final  . Comment 3 04/18/2015          Final   Comment: Due to the release kinetics of cTnI, a negative result within the first hours of the onset of symptoms does not rule out myocardial infarction with certainty. If myocardial infarction is still suspected, repeat the test at appropriate intervals.   . B Natriuretic Peptide 04/18/2015 2821.7* 0.0 - 100.0 pg/mL Final  . Sodium 04/18/2015 122* 135 - 145 mmol/L Final  . Potassium 04/18/2015 7.2* 3.5 - 5.1 mmol/L Final   Comment: CRITICAL RESULT CALLED TO, READ BACK BY AND VERIFIED WITH: MIZE,S RN 04/19/2015 0041 JORDANS REPEATED TO VERIFY SPECIMEN HEMOLYZED. HEMOLYSIS MAY AFFECT INTEGRITY OF RESULTS.   Marland Kitchen Chloride 04/18/2015 84* 96 - 112 mmol/L Final  . CO2 04/18/2015 23  19 - 32 mmol/L Final  . Glucose, Bld 04/18/2015 201* 70 - 99 mg/dL Final  . BUN 04/18/2015 60* 6 - 23 mg/dL Final  . Creatinine, Ser 04/18/2015 6.78* 0.50 - 1.10 mg/dL Final  . Calcium 04/18/2015 8.5  8.4 - 10.5 mg/dL Final  . Total Protein 04/18/2015 8.7* 6.0 - 8.3 g/dL Final  . Albumin 04/18/2015 3.3* 3.5 - 5.2 g/dL Final  . AST 04/18/2015 108* 0 - 37 U/L Final  . ALT 04/18/2015 69* 0 - 35 U/L Final  . Alkaline Phosphatase 04/18/2015 200* 39 - 117 U/L Final  . Total Bilirubin 04/18/2015 1.5* 0.3 - 1.2 mg/dL Final  . GFR calc non Af Amer 04/18/2015 6* >90 mL/min Final  . GFR calc Af Amer 04/18/2015 7* >90 mL/min Final   Comment: (NOTE) The eGFR has been calculated using the CKD EPI equation. This  calculation has not been validated in all clinical situations. eGFR's persistently <90 mL/min signify possible Chronic Kidney Disease.   . Anion gap 04/18/2015 15  5 - 15 Final  . Hepatitis B Surface Ag 04/19/2015 NEGATIVE  NEGATIVE Final   Performed at [facility]  . Troponin I 04/19/2015 0.06* <0.031 ng/mL Final   Comment:        PERSISTENTLY INCREASED TROPONIN VALUES IN THE RANGE OF 0.04-0.49 ng/mL CAN BE SEEN IN:       -UNSTABLE ANGINA       -CONGESTIVE HEART FAILURE       -MYOCARDITIS       -CHEST TRAUMA       -ARRYHTHMIAS       -LATE PRESENTING MYOCARDIAL INFARCTION       -COPD   CLINICAL FOLLOW-UP RECOMMENDED.   Marland Kitchen Troponin I 04/19/2015 0.05* <0.031 ng/mL Final   Comment:        PERSISTENTLY INCREASED TROPONIN VALUES IN THE RANGE  OF 0.04-0.49 ng/mL CAN BE SEEN IN:       -UNSTABLE ANGINA       -CONGESTIVE HEART FAILURE       -MYOCARDITIS       -CHEST TRAUMA       -ARRYHTHMIAS       -LATE PRESENTING MYOCARDIAL INFARCTION       -COPD   CLINICAL FOLLOW-UP RECOMMENDED.   Marland Kitchen Troponin I 04/19/2015 0.05* <0.031 ng/mL Final   Comment:        PERSISTENTLY INCREASED TROPONIN VALUES IN THE RANGE OF 0.04-0.49 ng/mL CAN BE SEEN IN:       -UNSTABLE ANGINA       -CONGESTIVE HEART FAILURE       -MYOCARDITIS       -CHEST TRAUMA       -ARRYHTHMIAS       -LATE PRESENTING MYOCARDIAL INFARCTION       -COPD   CLINICAL FOLLOW-UP RECOMMENDED.   . WBC 04/19/2015 9.5  4.0 - 10.5 K/uL Final  . RBC 04/19/2015 3.38* 3.87 - 5.11 MIL/uL Final  . Hemoglobin 04/19/2015 10.3* 12.0 - 15.0 g/dL Final   Comment: SPECIMEN CHECKED FOR CLOTS REPEATED TO VERIFY   . HCT 04/19/2015 31.9* 36.0 - 46.0 % Final  . MCV 04/19/2015 94.4  78.0 - 100.0 fL Final   CONSISTENT WITH PREVIOUS RESULT  . [place] 04/19/2015 30.5  26.0 - 34.0 pg Final  . MCHC 04/19/2015 32.3  30.0 - 36.0 g/dL Final  . RDW 04/19/2015 16.5* 11.5 - 15.5 % Final   CONSISTENT WITH PREVIOUS RESULT  . Platelets 04/19/2015 400   150 - 400 K/uL Final   Comment: SPECIMEN CHECKED FOR CLOTS REPEATED TO VERIFY CONSISTENT WITH PREVIOUS RESULT   . Sodium 04/19/2015 135  135 - 145 mmol/L Final  . Potassium 04/19/2015 3.6  3.5 - 5.1 mmol/L Final  . Chloride 04/19/2015 94* 96 - 112 mmol/L Final  . CO2 04/19/2015 28  19 - 32 mmol/L Final  . Glucose, Bld 04/19/2015 108* 70 - 99 mg/dL Final  . BUN 04/19/2015 22  6 - 23 mg/dL Final  . Creatinine, Ser 04/19/2015 3.75* 0.50 - 1.10 mg/dL Final  . Calcium 04/19/2015 8.7  8.4 - 10.5 mg/dL Final  . Total Protein 04/19/2015 7.8  6.0 - 8.3 g/dL Final  . Albumin 04/19/2015 3.1* 3.5 - 5.2 g/dL Final  . AST 04/19/2015 38* 0 - 37 U/L Final  . ALT 04/19/2015 52* 0 - 35 U/L Final  . Alkaline Phosphatase 04/19/2015 183* 39 - 117 U/L Final  . Total Bilirubin 04/19/2015 0.7  0.3 - 1.2 mg/dL Final  . GFR calc non Af Amer 04/19/2015 12* >90 mL/min Final  . GFR calc Af Amer 04/19/2015 14* >90 mL/min Final   Comment: (NOTE) The eGFR has been calculated using the CKD EPI equation. This calculation has not been validated in all clinical situations. eGFR's persistently <90 mL/min signify possible Chronic Kidney Disease.   . Anion gap 04/19/2015 13  5 - 15 Final  . TSH 04/19/2015 1.839  0.350 - 4.500 uIU/mL Final  . Hgb A1c MFr Bld 04/19/2015 5.4  4.8 - 5.6 % Final   Comment: (NOTE)         Pre-diabetes: 5.7 - 6.4         Diabetes: >6.4         Glycemic control for adults with diabetes: <7.0   . Mean Plasma Glucose 04/19/2015 108   Final   Comment: (NOTE) Performed  At: [facility] [place], [place] [id number] Lindon Romp MD PP:[phone]   . MRSA by PCR 04/19/2015 POSITIVE* NEGATIVE Final   Comment:        The GeneXpert MRSA Assay (FDA approved for NASAL specimens only), is one component of a comprehensive MRSA colonization surveillance program. It is not intended to diagnose MRSA infection nor to guide or monitor treatment for MRSA  infections. RESULT CALLED TO, READ BACK BY AND VERIFIED WITH: WOLF RN 9:45 04/19/15 ([email or web address])   . Glucose-Capillary 04/19/2015 97  70 - 99 mg/dL Final  . Comment 1 04/19/2015 Capillary Specimen   Final  . Sodium 04/19/2015 135  135 - 145 mmol/L Final  . Potassium 04/19/2015 3.7  3.5 - 5.1 mmol/L Final  . Chloride 04/19/2015 94* 96 - 112 mmol/L Final  . CO2 04/19/2015 28  19 - 32 mmol/L Final  . Glucose, Bld 04/19/2015 108* 70 - 99 mg/dL Final  . BUN 04/19/2015 22  6 - 23 mg/dL Final  . Creatinine, Ser 04/19/2015 3.80* 0.50 - 1.10 mg/dL Final   DELTA CHECK NOTED  . Calcium 04/19/2015 8.7  8.4 - 10.5 mg/dL Final  . GFR calc non Af Amer 04/19/2015 12* >90 mL/min Final  . GFR calc Af Amer 04/19/2015 14* >90 mL/min Final   Comment: (NOTE) The eGFR has been calculated using the CKD EPI equation. This calculation has not been validated in all clinical situations. eGFR's persistently <90 mL/min signify possible Chronic Kidney Disease.   . Anion gap 04/19/2015 13  5 - 15 Final  . Glucose-Capillary 04/19/2015 127* 70 - 99 mg/dL Final  . Comment 1 04/19/2015 Capillary Specimen   Final  . Glucose-Capillary 04/19/2015 107* 70 - 99 mg/dL Final  . Glucose-Capillary 04/19/2015 92  70 - 99 mg/dL Final  . Glucose-Capillary 04/20/2015 106* 70 - 99 mg/dL Final  . Comment 1 04/20/2015 Notify RN   Final  . Comment 2 04/20/2015 Document in Chart   Final  . Glucose-Capillary 04/20/2015 99  70 - 99 mg/dL Final  . Glucose-Capillary 04/20/2015 100* 70 - 99 mg/dL Final  . Glucose-Capillary 04/20/2015 95  70 - 99 mg/dL Final  . Glucose-Capillary 04/20/2015 110* 70 - 99 mg/dL Final  . Comment 1 04/20/2015 Notify RN   Final  . Comment 2 04/20/2015 Document in Chart   Final  . Sodium 04/21/2015 127* 135 - 145 mmol/L Final   DELTA CHECK NOTED  . Potassium 04/21/2015 4.5  3.5 - 5.1 mmol/L Final   DELTA CHECK NOTED  . Chloride 04/21/2015 89* 96 - 112 mmol/L Final  . CO2 04/21/2015 23  19 - 32 mmol/L Final  .  Glucose, Bld 04/21/2015 72  70 - 99 mg/dL Final  . BUN 04/21/2015 66* 6 - 23 mg/dL Final   DELTA CHECK NOTED  . Creatinine, Ser 04/21/2015 6.56* 0.50 - 1.10 mg/dL Final   DELTA CHECK NOTED  . Calcium 04/21/2015 8.5  8.4 - 10.5 mg/dL Final  . Phosphorus 04/21/2015 4.7* 2.3 - 4.6 mg/dL Final  . Albumin 04/21/2015 2.6* 3.5 - 5.2 g/dL Final  . GFR calc non Af Amer 04/21/2015 6* >90 mL/min Final  . GFR calc Af Amer 04/21/2015 7* >90 mL/min Final   Comment: (NOTE) The eGFR has been calculated using the CKD EPI equation. This calculation has not been validated in all clinical situations. eGFR's persistently <90 mL/min signify possible Chronic Kidney Disease.   . Anion gap 04/21/2015 15  5 - 15 Final  .  WBC 04/21/2015 8.9  4.0 - 10.5 K/uL Final  . RBC 04/21/2015 3.00* 3.87 - 5.11 MIL/uL Final  . Hemoglobin 04/21/2015 9.2* 12.0 - 15.0 g/dL Final  . HCT 04/21/2015 28.1* 36.0 - 46.0 % Final  . MCV 04/21/2015 93.7  78.0 - 100.0 fL Final  . MCH 04/21/2015 30.7  26.0 - 34.0 pg Final  . MCHC 04/21/2015 32.7  30.0 - 36.0 g/dL Final  . RDW 04/21/2015 16.2* 11.5 - 15.5 % Final  . Platelets 04/21/2015 374  150 - 400 K/uL Final  . Glucose-Capillary 04/21/2015 75  70 - 99 mg/dL Final  . Glucose-Capillary 04/21/2015 144* 70 - 99 mg/dL Final  Admission on 04/02/2015, Discharged on 04/02/2015  Component Date Value Ref Range Status  . Sodium 04/02/2015 130* 135 - 145 mmol/L Final  . Potassium 04/02/2015 4.7  3.5 - 5.1 mmol/L Final  . Glucose, Bld 04/02/2015 116* 70 - 99 mg/dL Final  . HCT 04/02/2015 40.0  36.0 - 46.0 % Final  . Hemoglobin 04/02/2015 13.6  12.0 - 15.0 g/dL Final    Dg Knee 2 Views Left  05/16/2015   CLINICAL DATA:  Swelling at the left knee, acute onset. Initial encounter.  EXAM: LEFT KNEE - 1-2 VIEW  COMPARISON:  None.  FINDINGS: There is a comminuted fracture involving the lower pole of the patella, with approximately 3 cm of separation and diffuse overlying soft tissue swelling. An  associated moderate to large knee joint effusion is noted.  No additional fractures are seen. Mild marginal osteophytes are seen arising at the lateral compartment.  IMPRESSION: Comminuted fracture involving the lower pole of the patella, with approximately 3 cm of separation at the fracture, and diffuse overlying soft tissue swelling. Associated moderate to large knee joint effusion noted.   Electronically Signed   By: Garald Balding M.D.   On: 05/16/2015 03:10   Dg Hip Unilat With Pelvis 2-3 Views Left  05/16/2015   CLINICAL DATA:  Acute onset of left hip pain.  Initial encounter.  EXAM: LEFT HIP (WITH PELVIS) 2-3 VIEWS  COMPARISON:  None.  FINDINGS: There is no evidence of fracture or dislocation. Both femoral heads are seated normally within their respective acetabula. The proximal left femur appears intact. No significant degenerative change is appreciated. The sacroiliac joints are unremarkable in appearance.  The visualized bowel gas pattern is grossly unremarkable in appearance. Calcified fibroids are noted overlying the pelvis.  IMPRESSION: 1. No evidence of fracture or dislocation. 2. Calcified fibroids noted.   Electronically Signed   By: Garald Balding M.D.   On: 05/16/2015 03:18     Assessment/Plan   ICD-9-CM ICD-10-CM   1. Patellar fracture, left, closed, initial encounter 822.0 S82.002A   2. End stage renal disease on dialysis ([facility]) 585.6 N18.6    V45.11 Z99.2   3. PAD (peripheral artery disease) ([facility]) 443.9 I73.9   4. Essential hypertension 401.9 I10   5. Chronic diastolic CHF (congestive heart failure) ([facility]) 428.32 I50.32    428.0    6.      Paranoid psychosis  --continue current meds as ordered  --fall precautions  --PT/OT when indicated  --maintain left nee immobilizer until seen by ortho  --f/u with ortho as scheduled  --wound care as ordered for right TMA care  --f/u HD as scheduled MWF. Cont 1200cc fluid restriciton  --cont nutritional supplements as  ordered  --will follow. She is [facility] resident.  Vittoria Noreen S. Rae Lips., [facility] [address] [place], [place] [address] [phone] Cell (Monday-Friday 8 AM - 5 PM) [phone] After 5 PM and follow prompts

## 2015-05-25 DIAGNOSIS — E1129 Type 2 diabetes mellitus with other diabetic kidney complication: Secondary | ICD-10-CM | POA: Diagnosis not present

## 2015-05-25 DIAGNOSIS — N186 End stage renal disease: Secondary | ICD-10-CM | POA: Diagnosis not present

## 2015-05-25 DIAGNOSIS — Z992 Dependence on renal dialysis: Secondary | ICD-10-CM | POA: Diagnosis not present

## 2015-05-26 DIAGNOSIS — E1129 Type 2 diabetes mellitus with other diabetic kidney complication: Secondary | ICD-10-CM | POA: Diagnosis not present

## 2015-05-26 DIAGNOSIS — N186 End stage renal disease: Secondary | ICD-10-CM | POA: Diagnosis not present

## 2015-05-26 DIAGNOSIS — N2581 Secondary hyperparathyroidism of renal origin: Secondary | ICD-10-CM | POA: Diagnosis not present

## 2015-05-26 DIAGNOSIS — D509 Iron deficiency anemia, unspecified: Secondary | ICD-10-CM | POA: Diagnosis not present

## 2015-05-26 DIAGNOSIS — D631 Anemia in chronic kidney disease: Secondary | ICD-10-CM | POA: Diagnosis not present

## 2015-05-28 DIAGNOSIS — E1129 Type 2 diabetes mellitus with other diabetic kidney complication: Secondary | ICD-10-CM | POA: Diagnosis not present

## 2015-05-28 DIAGNOSIS — N186 End stage renal disease: Secondary | ICD-10-CM | POA: Diagnosis not present

## 2015-05-28 DIAGNOSIS — N2581 Secondary hyperparathyroidism of renal origin: Secondary | ICD-10-CM | POA: Diagnosis not present

## 2015-05-28 DIAGNOSIS — D631 Anemia in chronic kidney disease: Secondary | ICD-10-CM | POA: Diagnosis not present

## 2015-05-28 DIAGNOSIS — D509 Iron deficiency anemia, unspecified: Secondary | ICD-10-CM | POA: Diagnosis not present

## 2015-05-31 DIAGNOSIS — D631 Anemia in chronic kidney disease: Secondary | ICD-10-CM | POA: Diagnosis not present

## 2015-05-31 DIAGNOSIS — E1129 Type 2 diabetes mellitus with other diabetic kidney complication: Secondary | ICD-10-CM | POA: Diagnosis not present

## 2015-05-31 DIAGNOSIS — N2581 Secondary hyperparathyroidism of renal origin: Secondary | ICD-10-CM | POA: Diagnosis not present

## 2015-05-31 DIAGNOSIS — D509 Iron deficiency anemia, unspecified: Secondary | ICD-10-CM | POA: Diagnosis not present

## 2015-05-31 DIAGNOSIS — N186 End stage renal disease: Secondary | ICD-10-CM | POA: Diagnosis not present

## 2015-06-02 DIAGNOSIS — N2581 Secondary hyperparathyroidism of renal origin: Secondary | ICD-10-CM | POA: Diagnosis not present

## 2015-06-02 DIAGNOSIS — E1129 Type 2 diabetes mellitus with other diabetic kidney complication: Secondary | ICD-10-CM | POA: Diagnosis not present

## 2015-06-02 DIAGNOSIS — N186 End stage renal disease: Secondary | ICD-10-CM | POA: Diagnosis not present

## 2015-06-02 DIAGNOSIS — D509 Iron deficiency anemia, unspecified: Secondary | ICD-10-CM | POA: Diagnosis not present

## 2015-06-02 DIAGNOSIS — D631 Anemia in chronic kidney disease: Secondary | ICD-10-CM | POA: Diagnosis not present

## 2015-06-04 DIAGNOSIS — E1129 Type 2 diabetes mellitus with other diabetic kidney complication: Secondary | ICD-10-CM | POA: Diagnosis not present

## 2015-06-04 DIAGNOSIS — D509 Iron deficiency anemia, unspecified: Secondary | ICD-10-CM | POA: Diagnosis not present

## 2015-06-04 DIAGNOSIS — D631 Anemia in chronic kidney disease: Secondary | ICD-10-CM | POA: Diagnosis not present

## 2015-06-04 DIAGNOSIS — N2581 Secondary hyperparathyroidism of renal origin: Secondary | ICD-10-CM | POA: Diagnosis not present

## 2015-06-04 DIAGNOSIS — N186 End stage renal disease: Secondary | ICD-10-CM | POA: Diagnosis not present

## 2015-06-07 DIAGNOSIS — N2581 Secondary hyperparathyroidism of renal origin: Secondary | ICD-10-CM | POA: Diagnosis not present

## 2015-06-07 DIAGNOSIS — D631 Anemia in chronic kidney disease: Secondary | ICD-10-CM | POA: Diagnosis not present

## 2015-06-07 DIAGNOSIS — N186 End stage renal disease: Secondary | ICD-10-CM | POA: Diagnosis not present

## 2015-06-07 DIAGNOSIS — E1129 Type 2 diabetes mellitus with other diabetic kidney complication: Secondary | ICD-10-CM | POA: Diagnosis not present

## 2015-06-07 DIAGNOSIS — D509 Iron deficiency anemia, unspecified: Secondary | ICD-10-CM | POA: Diagnosis not present

## 2015-06-09 DIAGNOSIS — D509 Iron deficiency anemia, unspecified: Secondary | ICD-10-CM | POA: Diagnosis not present

## 2015-06-09 DIAGNOSIS — E1129 Type 2 diabetes mellitus with other diabetic kidney complication: Secondary | ICD-10-CM | POA: Diagnosis not present

## 2015-06-09 DIAGNOSIS — N2581 Secondary hyperparathyroidism of renal origin: Secondary | ICD-10-CM | POA: Diagnosis not present

## 2015-06-09 DIAGNOSIS — D631 Anemia in chronic kidney disease: Secondary | ICD-10-CM | POA: Diagnosis not present

## 2015-06-09 DIAGNOSIS — N186 End stage renal disease: Secondary | ICD-10-CM | POA: Diagnosis not present

## 2015-06-11 DIAGNOSIS — N186 End stage renal disease: Secondary | ICD-10-CM | POA: Diagnosis not present

## 2015-06-11 DIAGNOSIS — D631 Anemia in chronic kidney disease: Secondary | ICD-10-CM | POA: Diagnosis not present

## 2015-06-11 DIAGNOSIS — E1129 Type 2 diabetes mellitus with other diabetic kidney complication: Secondary | ICD-10-CM | POA: Diagnosis not present

## 2015-06-11 DIAGNOSIS — N2581 Secondary hyperparathyroidism of renal origin: Secondary | ICD-10-CM | POA: Diagnosis not present

## 2015-06-11 DIAGNOSIS — D509 Iron deficiency anemia, unspecified: Secondary | ICD-10-CM | POA: Diagnosis not present

## 2015-06-14 DIAGNOSIS — D509 Iron deficiency anemia, unspecified: Secondary | ICD-10-CM | POA: Diagnosis not present

## 2015-06-14 DIAGNOSIS — N2581 Secondary hyperparathyroidism of renal origin: Secondary | ICD-10-CM | POA: Diagnosis not present

## 2015-06-14 DIAGNOSIS — D631 Anemia in chronic kidney disease: Secondary | ICD-10-CM | POA: Diagnosis not present

## 2015-06-14 DIAGNOSIS — N186 End stage renal disease: Secondary | ICD-10-CM | POA: Diagnosis not present

## 2015-06-14 DIAGNOSIS — E1129 Type 2 diabetes mellitus with other diabetic kidney complication: Secondary | ICD-10-CM | POA: Diagnosis not present

## 2015-06-16 DIAGNOSIS — N186 End stage renal disease: Secondary | ICD-10-CM | POA: Diagnosis not present

## 2015-06-16 DIAGNOSIS — D509 Iron deficiency anemia, unspecified: Secondary | ICD-10-CM | POA: Diagnosis not present

## 2015-06-16 DIAGNOSIS — N2581 Secondary hyperparathyroidism of renal origin: Secondary | ICD-10-CM | POA: Diagnosis not present

## 2015-06-16 DIAGNOSIS — D631 Anemia in chronic kidney disease: Secondary | ICD-10-CM | POA: Diagnosis not present

## 2015-06-16 DIAGNOSIS — E1129 Type 2 diabetes mellitus with other diabetic kidney complication: Secondary | ICD-10-CM | POA: Diagnosis not present

## 2015-06-17 ENCOUNTER — Encounter: Payer: Self-pay | Admitting: Internal Medicine

## 2015-06-17 DIAGNOSIS — I5032 Chronic diastolic (congestive) heart failure: Secondary | ICD-10-CM | POA: Insufficient documentation

## 2015-06-18 DIAGNOSIS — N186 End stage renal disease: Secondary | ICD-10-CM | POA: Diagnosis not present

## 2015-06-18 DIAGNOSIS — D631 Anemia in chronic kidney disease: Secondary | ICD-10-CM | POA: Diagnosis not present

## 2015-06-18 DIAGNOSIS — N2581 Secondary hyperparathyroidism of renal origin: Secondary | ICD-10-CM | POA: Diagnosis not present

## 2015-06-18 DIAGNOSIS — E1129 Type 2 diabetes mellitus with other diabetic kidney complication: Secondary | ICD-10-CM | POA: Diagnosis not present

## 2015-06-18 DIAGNOSIS — D509 Iron deficiency anemia, unspecified: Secondary | ICD-10-CM | POA: Diagnosis not present

## 2015-06-21 ENCOUNTER — Encounter: Payer: Self-pay | Admitting: Internal Medicine

## 2015-06-21 ENCOUNTER — Non-Acute Institutional Stay (SKILLED_NURSING_FACILITY): Payer: Medicare Other | Admitting: Internal Medicine

## 2015-06-21 DIAGNOSIS — S82892D Other fracture of left lower leg, subsequent encounter for closed fracture with routine healing: Secondary | ICD-10-CM | POA: Diagnosis not present

## 2015-06-21 DIAGNOSIS — E1122 Type 2 diabetes mellitus with diabetic chronic kidney disease: Secondary | ICD-10-CM

## 2015-06-21 DIAGNOSIS — E1322 Other specified diabetes mellitus with diabetic chronic kidney disease: Secondary | ICD-10-CM | POA: Diagnosis not present

## 2015-06-21 DIAGNOSIS — I255 Ischemic cardiomyopathy: Secondary | ICD-10-CM | POA: Diagnosis not present

## 2015-06-21 DIAGNOSIS — D631 Anemia in chronic kidney disease: Secondary | ICD-10-CM | POA: Diagnosis not present

## 2015-06-21 DIAGNOSIS — N186 End stage renal disease: Secondary | ICD-10-CM | POA: Diagnosis not present

## 2015-06-21 DIAGNOSIS — L97519 Non-pressure chronic ulcer of other part of right foot with unspecified severity: Secondary | ICD-10-CM | POA: Diagnosis not present

## 2015-06-21 DIAGNOSIS — R627 Adult failure to thrive: Secondary | ICD-10-CM | POA: Diagnosis not present

## 2015-06-21 DIAGNOSIS — Z992 Dependence on renal dialysis: Secondary | ICD-10-CM

## 2015-06-21 DIAGNOSIS — H540 Blindness, both eyes: Secondary | ICD-10-CM

## 2015-06-21 DIAGNOSIS — I10 Essential (primary) hypertension: Secondary | ICD-10-CM | POA: Diagnosis not present

## 2015-06-21 DIAGNOSIS — Z7382 Dual sensory impairment: Secondary | ICD-10-CM

## 2015-06-21 DIAGNOSIS — N2581 Secondary hyperparathyroidism of renal origin: Secondary | ICD-10-CM | POA: Diagnosis not present

## 2015-06-21 DIAGNOSIS — E1129 Type 2 diabetes mellitus with other diabetic kidney complication: Secondary | ICD-10-CM | POA: Diagnosis not present

## 2015-06-21 DIAGNOSIS — H547 Unspecified visual loss: Secondary | ICD-10-CM

## 2015-06-21 DIAGNOSIS — I739 Peripheral vascular disease, unspecified: Secondary | ICD-10-CM

## 2015-06-21 DIAGNOSIS — D509 Iron deficiency anemia, unspecified: Secondary | ICD-10-CM | POA: Diagnosis not present

## 2015-06-21 DIAGNOSIS — H9190 Unspecified hearing loss, unspecified ear: Secondary | ICD-10-CM

## 2015-06-21 NOTE — Progress Notes (Signed)
Patient ID: [name], female   DOB: 1956-12-08, 59 y.o.   MRN: [id number]    DATE: 06/21/15  Location:  [facility] of Service: [id number] [phone])   Extended Emergency Contact Information Primary Emergency Contact: [name] Address: [address], [place] [address] [place] Phone: [phone] Relation: Spouse Secondary Emergency Contact: [name] of [place] Phone: [phone] Mobile Phone: [phone] Relation: Sister  Advanced Directive information  FULL CODE  Chief Complaint  Patient presents with  . Medical Management of Chronic Issues    HPI:  59 yo female [name] term resident seen today for f/u. She is awaiting transport to HD. She has pain in left knee. No nursing issues. She is tolerating PT and has been walking well with assistance. She is smiling today.  BP controlled on metoprolol. She has ischemic heart disease with hx NSVT and is taking BB, amiodarone and prn nitrostat. cholesterol stable on lipitor. She has a hx MI and is also taking plavix  Left knee fx healing and she still wears her brace. She takes pain med prn. Constipation stable on lactulose and senns.  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 Hx obtained from chart and nursing  Past Medical History  Diagnosis Date  . Hyperlipidemia   . Hypertension     a. Has previously refused blood pressure meds.   . Deaf     USE SIGN INTERPRETER.  Divorced from husband but lives with him. Has daughter but she does not care for her.  . Bilateral leg edema     a. Chronic.  Marland Kitchen Psychiatric disorder     She frequently exhibits paranoia and has been diagnosed with psychotic d/o NOS during hospital stay in the past. She is tangetial and perseverative during her visits. She apparently has had a bad experience with mental health in [place] in  the past and refuses to discuss mental issues for fear that she will be sent back there. Her paranoia, communication issues, financial woes and lack of fam  . Fibroid uterus   . Anemia     Due to fibroids.  . Heart murmur   . Diabetes mellitus     a. Per PCP note 2012 (A1C 9.2) - pt unwilling to take meds and was educated on risk of uncontrolled DM.  b. A1C 5.9 in 11/2013.  Marland Kitchen Coronary artery disease   . Blind   . ESRD on hemodialysis     Mon, Wed, Fri  . CHF (congestive heart failure)   . MI (myocardial infarction) 11/2014  . Essential hypertension 08/09/2009    Qualifier: Diagnosis of  By: Sarita Haver  MD, Coralyn Mark      Past Surgical History  Procedure Laterality Date  . No past surgeries    . Insertion of dialysis catheter Right 01/02/2014    Procedure: INSERTION OF DIALYSIS CATHETER;  Surgeon: Rosetta Posner, MD;  Location: [facility];  Service: Vascular;  Laterality: Right;  . Av fistula placement Left 01/02/2014    Procedure: ARTERIOVENOUS (AV) FISTULA CREATION;  Surgeon: Rosetta Posner, MD;  Location: [facility];  Service: Vascular;  Laterality: Left;  . Amputation Bilateral 04/02/2015    Procedure: Amputation Bilateral Great Toes MTP Joint;  Surgeon: Newt Minion, MD;  Location: [facility];  Service: Orthopedics;  Laterality: Bilateral;  . Amputation Left 05/14/2015    Procedure: Left Transmetatarsal Amputation;  Surgeon: Newt Minion, MD;  Location: [facility];  Service: Orthopedics;  Laterality: Left;  . Orif patella Left 05/17/2015    Procedure: OPEN REDUCTION INTERNAL (ORIF) FIXATION PATELLA;  Surgeon: Newt Minion, MD;  Location: [facility];  Service: Orthopedics;  Laterality: Left;    Patient Care Team: [name] Cranker, DO as PCP - General (Internal Medicine)  History   Social History  . Marital Status: Married    Spouse Name: N/A  . Number of Children: N/A  . Years of Education: N/A   Occupational History  . Not on file.   Social History Main Topics  . Smoking status: Never Smoker   . Smokeless  tobacco: Not on file     Comment: Prior dip  . Alcohol Use: No  . Drug Use: No  . Sexual Activity: Not on file   Other Topics Concern  . Not on file   Social History Narrative     reports that she has never smoked. She does not have any smokeless tobacco history on file. She reports that she does not drink alcohol or use illicit drugs.  Immunization History  Administered Date(s) Administered  . Influenza,inj,Quad PF,36+ Mos 01/03/2014  . Influenza-Unspecified 09/24/2014  . PPD Test 07/28/2014  . Pneumococcal Polysaccharide-23 01/03/2014    No Known Allergies  Medications: Patient's Medications  New Prescriptions   No medications on file  Previous Medications   ACETAMINOPHEN (TYLENOL) 325 MG TABLET    Take 650 mg by mouth 3 (three) times daily.    ALBUTEROL (PROVENTIL) (2.5 MG/3ML) 0.083% NEBULIZER SOLUTION    Take 2.5 mg by nebulization every 2 (two) hours as needed for wheezing or shortness of breath.   AMINO ACIDS-PROTEIN HYDROLYS (FEEDING SUPPLEMENT, PRO-STAT SUGAR FREE 64,) LIQD    Take 30 mLs by mouth 3 (three) times daily with meals.   AMIODARONE (PACERONE) 200 MG TABLET    Take 200 mg by mouth daily.   ASPIRIN 81 MG TABLET    Take 81 mg by mouth daily. For hypertension   ATORVASTATIN (LIPITOR) 10 MG TABLET    Take 10 mg by mouth daily.   CALCIUM ACETATE (PHOSLO) 667 MG CAPSULE    Take 1 capsule (667 mg total) by mouth 3 (three) times daily with meals.   CLOPIDOGREL (PLAVIX) 75 MG TABLET    Take 1 tablet (75 mg total) by mouth daily.   GUAIFENESIN (MUCINEX) 600 MG 12 HR TABLET    Take 1 tablet (600 mg total) by mouth 2 (two) times daily.   HYDROCODONE-ACETAMINOPHEN (NORCO) 5-325 MG PER TABLET    Take 1 tablet by mouth every 6 (six) hours as needed.   LACTULOSE (CHRONULAC) 10 GM/15ML SOLUTION    Take 30 mLs (20 g total) by mouth 2 (two) times daily as needed for moderate constipation.   METOPROLOL SUCCINATE (TOPROL-XL) 50 MG 24 HR TABLET    Take 1 tablet (50 mg total) by  mouth daily. Take with or immediately following a meal.   MULTIPLE VITAMIN (MULTIVITAMIN WITH MINERALS) TABS TABLET    Take 2 tablets by mouth daily.   NITROGLYCERIN (NITROSTAT) 0.4 MG SL TABLET    Place 1 tablet (0.4 mg total) under the tongue every 5 (five) minutes as needed for chest pain.   NUTRITIONAL SUPPLEMENTS (FEEDING SUPPLEMENT, NEPRO CARB STEADY,) LIQD    Take 237 mLs by mouth as needed (missed meal during dialysis.).  SENNA-DOCUSATE (SENOKOT-S) 8.6-50 MG PER TABLET    Take 1 tablet by mouth 2 (two) times daily.  Modified Medications   No medications on file  Discontinued Medications   No medications on file    Review of Systems  Unable to perform ROS: Patient nonverbal    Filed Vitals:   06/21/15 1726  BP: 128/70  Pulse: 68  Temp: 98.4 F (36.9 C)  Weight: 176 lb (79.833 kg)  SpO2: 98%   Body mass index is 27.56 kg/(m^2).  Physical Exam  Constitutional: She appears well-developed.  Frail appearing in NAD  HENT:  Mouth/Throat: Oropharynx is clear and moist. No oropharyngeal exudate.  Eyes: Right eye exhibits no discharge. Left eye exhibits no discharge.  Neck: Neck supple. Carotid bruit is not present. No tracheal deviation present.  Cardiovascular: Normal rate, regular rhythm, normal heart sounds and intact distal pulses.  Exam reveals no gallop and no friction rub.   No murmur heard. Left arm AVF palpable thrill/audible bruit  Pulmonary/Chest: Effort normal and breath sounds normal. No stridor. No respiratory distress. She has no wheezes. She has no rales.  Abdominal: Soft. Bowel sounds are normal. She exhibits no distension and no mass. There is no hepatomegaly. There is no tenderness. There is no rebound and no guarding.  Musculoskeletal: She exhibits edema and tenderness.  Left knee immobilizer present with increased swelling. Left TMA; right foot boot intact  Lymphadenopathy:    She has no cervical adenopathy.  Neurological: She is alert.  Skin: Skin is  warm and dry. No rash noted.  Psychiatric: She has a normal mood and affect. Her behavior is normal. She is noncommunicative.     Labs reviewed: Admission on 05/16/2015, Discharged on 05/18/2015  Component Date Value Ref Range Status  . Sodium 05/16/2015 131* 135 - 145 mmol/L Final  . Potassium 05/16/2015 4.8  3.5 - 5.1 mmol/L Final  . Chloride 05/16/2015 89* 101 - 111 mmol/L Final  . CO2 05/16/2015 28  22 - 32 mmol/L Final  . Glucose, Bld 05/16/2015 90  65 - 99 mg/dL Final  . BUN 05/16/2015 31* 6 - 20 mg/dL Final  . Creatinine, Ser 05/16/2015 4.62* 0.44 - 1.00 mg/dL Final  . Calcium 05/16/2015 8.9  8.9 - 10.3 mg/dL Final  . GFR calc non Af Amer 05/16/2015 10* >60 mL/min Final  . GFR calc Af Amer 05/16/2015 11* >60 mL/min Final   Comment: (NOTE) The eGFR has been calculated using the CKD EPI equation. This calculation has not been validated in all clinical situations. eGFR's persistently <60 mL/min signify possible Chronic Kidney Disease.   . Anion gap 05/16/2015 14  5 - 15 Final  . WBC 05/16/2015 10.9* 4.0 - 10.5 K/uL Final  . RBC 05/16/2015 4.01  3.87 - 5.11 MIL/uL Final  . Hemoglobin 05/16/2015 12.1  12.0 - 15.0 g/dL Final  . HCT 05/16/2015 37.2  36.0 - 46.0 % Final  . MCV 05/16/2015 92.8  78.0 - 100.0 fL Final  . MCH 05/16/2015 30.2  26.0 - 34.0 pg Final  . MCHC 05/16/2015 32.5  30.0 - 36.0 g/dL Final  . RDW 05/16/2015 17.1* 11.5 - 15.5 % Final  . Platelets 05/16/2015 455* 150 - 400 K/uL Final  . Neutrophils Relative % 05/16/2015 72  43 - 77 % Final  . Neutro Abs 05/16/2015 7.9* 1.7 - 7.7 K/uL Final  . Lymphocytes Relative 05/16/2015 9* 12 - 46 % Final  . Lymphs Abs 05/16/2015 1.0  0.7 - 4.0 K/uL Final  .  Monocytes Relative 05/16/2015 16* 3 - 12 % Final  . Monocytes Absolute 05/16/2015 1.7* 0.1 - 1.0 K/uL Final  . Eosinophils Relative 05/16/2015 2  0 - 5 % Final  . Eosinophils Absolute 05/16/2015 0.2  0.0 - 0.7 K/uL Final  . Basophils Relative 05/16/2015 1  0 - 1 % Final   . Basophils Absolute 05/16/2015 0.1  0.0 - 0.1 K/uL Final  . WBC 05/17/2015 8.2  4.0 - 10.5 K/uL Final  . RBC 05/17/2015 3.71* 3.87 - 5.11 MIL/uL Final  . Hemoglobin 05/17/2015 11.1* 12.0 - 15.0 g/dL Final  . HCT 05/17/2015 35.0* 36.0 - 46.0 % Final  . MCV 05/17/2015 94.3  78.0 - 100.0 fL Final  . MCH 05/17/2015 29.9  26.0 - 34.0 pg Final  . MCHC 05/17/2015 31.7  30.0 - 36.0 g/dL Final  . RDW 05/17/2015 17.6* 11.5 - 15.5 % Final  . Platelets 05/17/2015 504* 150 - 400 K/uL Final  . Sodium 05/17/2015 130* 135 - 145 mmol/L Final  . Potassium 05/17/2015 5.1  3.5 - 5.1 mmol/L Final  . Chloride 05/17/2015 90* 101 - 111 mmol/L Final  . CO2 05/17/2015 28  22 - 32 mmol/L Final  . Glucose, Bld 05/17/2015 124* 65 - 99 mg/dL Final  . BUN 05/17/2015 57* 6 - 20 mg/dL Final  . Creatinine, Ser 05/17/2015 6.78* 0.44 - 1.00 mg/dL Final  . Calcium 05/17/2015 8.9  8.9 - 10.3 mg/dL Final  . Phosphorus 05/17/2015 5.7* 2.5 - 4.6 mg/dL Final  . Albumin 05/17/2015 3.0* 3.5 - 5.0 g/dL Final  . GFR calc non Af Amer 05/17/2015 6* >60 mL/min Final  . GFR calc Af Amer 05/17/2015 7* >60 mL/min Final   Comment: (NOTE) The eGFR has been calculated using the CKD EPI equation. This calculation has not been validated in all clinical situations. eGFR's persistently <60 mL/min signify possible Chronic Kidney Disease.   . Anion gap 05/17/2015 12  5 - 15 Final  . Hepatitis B Surface Ag 05/17/2015 NEGATIVE  NEGATIVE Final   Performed at [facility]  . WBC 05/18/2015 9.0  4.0 - 10.5 K/uL Final  . RBC 05/18/2015 3.79* 3.87 - 5.11 MIL/uL Final  . Hemoglobin 05/18/2015 11.3* 12.0 - 15.0 g/dL Final  . HCT 05/18/2015 36.0  36.0 - 46.0 % Final  . MCV 05/18/2015 95.0  78.0 - 100.0 fL Final  . MCH 05/18/2015 29.8  26.0 - 34.0 pg Final  . MCHC 05/18/2015 31.4  30.0 - 36.0 g/dL Final  . RDW 05/18/2015 18.0* 11.5 - 15.5 % Final  . Platelets 05/18/2015 485* 150 - 400 K/uL Final  . Sodium 05/18/2015 134* 135 - 145  mmol/L Final  . Potassium 05/18/2015 5.1  3.5 - 5.1 mmol/L Final  . Chloride 05/18/2015 95* 101 - 111 mmol/L Final  . CO2 05/18/2015 24  22 - 32 mmol/L Final  . Glucose, Bld 05/18/2015 91  65 - 99 mg/dL Final  . BUN 05/18/2015 34* 6 - 20 mg/dL Final  . Creatinine, Ser 05/18/2015 5.34* 0.44 - 1.00 mg/dL Final  . Calcium 05/18/2015 8.9  8.9 - 10.3 mg/dL Final  . Phosphorus 05/18/2015 6.7* 2.5 - 4.6 mg/dL Final  . Albumin 05/18/2015 3.0* 3.5 - 5.0 g/dL Final  . GFR calc non Af Amer 05/18/2015 8* >60 mL/min Final  . GFR calc Af Amer 05/18/2015 9* >60 mL/min Final   Comment: (NOTE) The eGFR has been calculated using the CKD EPI equation. This calculation has not been validated in  all clinical situations. eGFR's persistently <60 mL/min signify possible Chronic Kidney Disease.   . Anion gap 05/18/2015 15  5 - 15 Final  . Glucose-Capillary 05/17/2015 106* 65 - 99 mg/dL Final  . Comment 1 05/17/2015 Repeat Test   Final  . Comment 2 05/17/2015 Document in Chart   Final  . Glucose-Capillary 05/17/2015 110* 65 - 99 mg/dL Final  . Comment 1 05/17/2015 Notify RN   Final  . Sodium 05/17/2015 134* 135 - 145 mmol/L Final  . Potassium 05/17/2015 4.4  3.5 - 5.1 mmol/L Final  . Glucose, Bld 05/17/2015 114* 65 - 99 mg/dL Final  . HCT 05/17/2015 40.0  36.0 - 46.0 % Final  . Hemoglobin 05/17/2015 13.6  12.0 - 15.0 g/dL Final  Admission on 05/14/2015, Discharged on 05/14/2015  Component Date Value Ref Range Status  . aPTT 05/14/2015 29  24 - 37 seconds Final   SLIGHT HEMOLYSIS  . Prothrombin Time 05/14/2015 15.2  11.6 - 15.2 seconds Final   SLIGHT HEMOLYSIS  . INR 05/14/2015 1.18  0.00 - 1.49 Final   SLIGHT HEMOLYSIS  . Glucose-Capillary 05/14/2015 78  65 - 99 mg/dL Final  . Sodium 05/14/2015 129* 135 - 145 mmol/L Final  . Potassium 05/14/2015 4.7  3.5 - 5.1 mmol/L Final  . Glucose, Bld 05/14/2015 81  65 - 99 mg/dL Final  . HCT 05/14/2015 43.0  36.0 - 46.0 % Final  . Hemoglobin 05/14/2015 14.6   12.0 - 15.0 g/dL Final  . Sodium 05/14/2015 127* 135 - 145 mmol/L Final  . Potassium 05/14/2015 5.9* 3.5 - 5.1 mmol/L Final  . Glucose, Bld 05/14/2015 79  65 - 99 mg/dL Final  . HCT 05/14/2015 47.0* 36.0 - 46.0 % Final  . Hemoglobin 05/14/2015 16.0* 12.0 - 15.0 g/dL Final  Admission on 04/18/2015, Discharged on 04/21/2015  Component Date Value Ref Range Status  . Sodium 04/18/2015 123* 135 - 145 mmol/L Final  . Potassium 04/18/2015 7.3* 3.5 - 5.1 mmol/L Final  . Chloride 04/18/2015 90* 96 - 112 mmol/L Final  . BUN 04/18/2015 89* 6 - 23 mg/dL Final  . Creatinine, Ser 04/18/2015 6.50* 0.50 - 1.10 mg/dL Final  . Glucose, Bld 04/18/2015 216* 70 - 99 mg/dL Final  . Calcium, Ion 04/18/2015 1.00* 1.12 - 1.23 mmol/L Final  . TCO2 04/18/2015 24  0 - 100 mmol/L Final  . Hemoglobin 04/18/2015 14.3  12.0 - 15.0 g/dL Final  . HCT 04/18/2015 42.0  36.0 - 46.0 % Final  . Comment 04/18/2015 NOTIFIED PHYSICIAN   Final  . WBC 04/18/2015 14.4* 4.0 - 10.5 K/uL Final  . RBC 04/18/2015 3.71* 3.87 - 5.11 MIL/uL Final  . Hemoglobin 04/18/2015 11.5* 12.0 - 15.0 g/dL Final  . HCT 04/18/2015 35.4* 36.0 - 46.0 % Final  . MCV 04/18/2015 95.4  78.0 - 100.0 fL Final  . MCH 04/18/2015 31.0  26.0 - 34.0 pg Final  . MCHC 04/18/2015 32.5  30.0 - 36.0 g/dL Final  . RDW 04/18/2015 16.6* 11.5 - 15.5 % Final  . Platelets 04/18/2015 409* 150 - 400 K/uL Final  . Neutrophils Relative % 04/18/2015 85* 43 - 77 % Final  . Neutro Abs 04/18/2015 12.1* 1.7 - 7.7 K/uL Final  . Lymphocytes Relative 04/18/2015 8* 12 - 46 % Final  . Lymphs Abs 04/18/2015 1.2  0.7 - 4.0 K/uL Final  . Monocytes Relative 04/18/2015 6  3 - 12 % Final  . Monocytes Absolute 04/18/2015 0.9  0.1 - 1.0 K/uL Final  .  Eosinophils Relative 04/18/2015 1  0 - 5 % Final  . Eosinophils Absolute 04/18/2015 0.2  0.0 - 0.7 K/uL Final  . Basophils Relative 04/18/2015 0  0 - 1 % Final  . Basophils Absolute 04/18/2015 0.1  0.0 - 0.1 K/uL Final  . Troponin i, poc  04/18/2015 0.04  0.00 - 0.08 ng/mL Final  . Comment 3 04/18/2015          Final   Comment: Due to the release kinetics of cTnI, a negative result within the first hours of the onset of symptoms does not rule out myocardial infarction with certainty. If myocardial infarction is still suspected, repeat the test at appropriate intervals.   . B Natriuretic Peptide 04/18/2015 2821.7* 0.0 - 100.0 pg/mL Final  . Sodium 04/18/2015 122* 135 - 145 mmol/L Final  . Potassium 04/18/2015 7.2* 3.5 - 5.1 mmol/L Final   Comment: CRITICAL RESULT CALLED TO, [name] BACK BY AND VERIFIED WITH: MIZE,S RN 04/19/2015 0041 JORDANS REPEATED TO VERIFY SPECIMEN HEMOLYZED. HEMOLYSIS MAY AFFECT INTEGRITY OF RESULTS.   Marland Kitchen Chloride 04/18/2015 84* 96 - 112 mmol/L Final  . CO2 04/18/2015 23  19 - 32 mmol/L Final  . Glucose, Bld 04/18/2015 201* 70 - 99 mg/dL Final  . BUN 04/18/2015 60* 6 - 23 mg/dL Final  . Creatinine, Ser 04/18/2015 6.78* 0.50 - 1.10 mg/dL Final  . Calcium 04/18/2015 8.5  8.4 - 10.5 mg/dL Final  . Total Protein 04/18/2015 8.7* 6.0 - 8.3 g/dL Final  . Albumin 04/18/2015 3.3* 3.5 - 5.2 g/dL Final  . AST 04/18/2015 108* 0 - 37 U/L Final  . ALT 04/18/2015 69* 0 - 35 U/L Final  . Alkaline Phosphatase 04/18/2015 200* 39 - 117 U/L Final  . Total Bilirubin 04/18/2015 1.5* 0.3 - 1.2 mg/dL Final  . GFR calc non Af Amer 04/18/2015 6* >90 mL/min Final  . GFR calc Af Amer 04/18/2015 7* >90 mL/min Final   Comment: (NOTE) The eGFR has been calculated using the CKD EPI equation. This calculation has not been validated in all clinical situations. eGFR's persistently <90 mL/min signify possible Chronic Kidney Disease.   . Anion gap 04/18/2015 15  5 - 15 Final  . Hepatitis B Surface Ag 04/19/2015 NEGATIVE  NEGATIVE Final   Performed at [facility]  . Troponin I 04/19/2015 0.06* <0.031 ng/mL Final   Comment:        PERSISTENTLY INCREASED TROPONIN VALUES IN THE RANGE OF 0.04-0.49 ng/mL CAN BE SEEN IN:        -UNSTABLE ANGINA       -CONGESTIVE HEART FAILURE       -MYOCARDITIS       -CHEST TRAUMA       -ARRYHTHMIAS       -LATE PRESENTING MYOCARDIAL INFARCTION       -COPD   CLINICAL FOLLOW-UP RECOMMENDED.   Marland Kitchen Troponin I 04/19/2015 0.05* <0.031 ng/mL Final   Comment:        PERSISTENTLY INCREASED TROPONIN VALUES IN THE RANGE OF 0.04-0.49 ng/mL CAN BE SEEN IN:       -UNSTABLE ANGINA       -CONGESTIVE HEART FAILURE       -MYOCARDITIS       -CHEST TRAUMA       -ARRYHTHMIAS       -LATE PRESENTING MYOCARDIAL INFARCTION       -COPD   CLINICAL FOLLOW-UP RECOMMENDED.   Marland Kitchen Troponin I 04/19/2015 0.05* <0.031 ng/mL Final   Comment:  PERSISTENTLY INCREASED TROPONIN VALUES IN THE RANGE OF 0.04-0.49 ng/mL CAN BE SEEN IN:       -UNSTABLE ANGINA       -CONGESTIVE HEART FAILURE       -MYOCARDITIS       -CHEST TRAUMA       -ARRYHTHMIAS       -LATE PRESENTING MYOCARDIAL INFARCTION       -COPD   CLINICAL FOLLOW-UP RECOMMENDED.   . WBC 04/19/2015 9.5  4.0 - 10.5 K/uL Final  . RBC 04/19/2015 3.38* 3.87 - 5.11 MIL/uL Final  . Hemoglobin 04/19/2015 10.3* 12.0 - 15.0 g/dL Final   Comment: SPECIMEN CHECKED FOR CLOTS REPEATED TO VERIFY   . HCT 04/19/2015 31.9* 36.0 - 46.0 % Final  . MCV 04/19/2015 94.4  78.0 - 100.0 fL Final   CONSISTENT WITH PREVIOUS RESULT  . [place] 04/19/2015 30.5  26.0 - 34.0 pg Final  . MCHC 04/19/2015 32.3  30.0 - 36.0 g/dL Final  . RDW 04/19/2015 16.5* 11.5 - 15.5 % Final   CONSISTENT WITH PREVIOUS RESULT  . Platelets 04/19/2015 400  150 - 400 K/uL Final   Comment: SPECIMEN CHECKED FOR CLOTS REPEATED TO VERIFY CONSISTENT WITH PREVIOUS RESULT   . Sodium 04/19/2015 135  135 - 145 mmol/L Final  . Potassium 04/19/2015 3.6  3.5 - 5.1 mmol/L Final  . Chloride 04/19/2015 94* 96 - 112 mmol/L Final  . CO2 04/19/2015 28  19 - 32 mmol/L Final  . Glucose, Bld 04/19/2015 108* 70 - 99 mg/dL Final  . BUN 04/19/2015 22  6 - 23 mg/dL Final  . Creatinine, Ser 04/19/2015 3.75*  0.50 - 1.10 mg/dL Final  . Calcium 04/19/2015 8.7  8.4 - 10.5 mg/dL Final  . Total Protein 04/19/2015 7.8  6.0 - 8.3 g/dL Final  . Albumin 04/19/2015 3.1* 3.5 - 5.2 g/dL Final  . AST 04/19/2015 38* 0 - 37 U/L Final  . ALT 04/19/2015 52* 0 - 35 U/L Final  . Alkaline Phosphatase 04/19/2015 183* 39 - 117 U/L Final  . Total Bilirubin 04/19/2015 0.7  0.3 - 1.2 mg/dL Final  . GFR calc non Af Amer 04/19/2015 12* >90 mL/min Final  . GFR calc Af Amer 04/19/2015 14* >90 mL/min Final   Comment: (NOTE) The eGFR has been calculated using the CKD EPI equation. This calculation has not been validated in all clinical situations. eGFR's persistently <90 mL/min signify possible Chronic Kidney Disease.   . Anion gap 04/19/2015 13  5 - 15 Final  . TSH 04/19/2015 1.839  0.350 - 4.500 uIU/mL Final  . Hgb A1c MFr Bld 04/19/2015 5.4  4.8 - 5.6 % Final   Comment: (NOTE)         Pre-diabetes: 5.7 - 6.4         Diabetes: >6.4         Glycemic control for adults with diabetes: <7.0   . Mean Plasma Glucose 04/19/2015 108   Final   Comment: (NOTE) Performed At: [facility] [place], [place] [id number] Lindon Romp MD XE:[id number]   . MRSA by PCR 04/19/2015 POSITIVE* NEGATIVE Final   Comment:        The GeneXpert MRSA Assay (FDA approved for NASAL specimens only), is one component of a comprehensive MRSA colonization surveillance program. It is not intended to diagnose MRSA infection nor to guide or monitor treatment for MRSA infections. RESULT CALLED TO, [name] BACK BY AND VERIFIED WITH: WOLF RN 9:45 04/19/15 ([email or web address])   .  Glucose-Capillary 04/19/2015 97  70 - 99 mg/dL Final  . Comment 1 04/19/2015 Capillary Specimen   Final  . Sodium 04/19/2015 135  135 - 145 mmol/L Final  . Potassium 04/19/2015 3.7  3.5 - 5.1 mmol/L Final  . Chloride 04/19/2015 94* 96 - 112 mmol/L Final  . CO2 04/19/2015 28  19 - 32 mmol/L Final  . Glucose, Bld 04/19/2015 108* 70 - 99 mg/dL Final  .  BUN 04/19/2015 22  6 - 23 mg/dL Final  . Creatinine, Ser 04/19/2015 3.80* 0.50 - 1.10 mg/dL Final   DELTA CHECK NOTED  . Calcium 04/19/2015 8.7  8.4 - 10.5 mg/dL Final  . GFR calc non Af Amer 04/19/2015 12* >90 mL/min Final  . GFR calc Af Amer 04/19/2015 14* >90 mL/min Final   Comment: (NOTE) The eGFR has been calculated using the CKD EPI equation. This calculation has not been validated in all clinical situations. eGFR's persistently <90 mL/min signify possible Chronic Kidney Disease.   . Anion gap 04/19/2015 13  5 - 15 Final  . Glucose-Capillary 04/19/2015 127* 70 - 99 mg/dL Final  . Comment 1 04/19/2015 Capillary Specimen   Final  . Glucose-Capillary 04/19/2015 107* 70 - 99 mg/dL Final  . Glucose-Capillary 04/19/2015 92  70 - 99 mg/dL Final  . Glucose-Capillary 04/20/2015 106* 70 - 99 mg/dL Final  . Comment 1 04/20/2015 Notify RN   Final  . Comment 2 04/20/2015 Document in Chart   Final  . Glucose-Capillary 04/20/2015 99  70 - 99 mg/dL Final  . Glucose-Capillary 04/20/2015 100* 70 - 99 mg/dL Final  . Glucose-Capillary 04/20/2015 95  70 - 99 mg/dL Final  . Glucose-Capillary 04/20/2015 110* 70 - 99 mg/dL Final  . Comment 1 04/20/2015 Notify RN   Final  . Comment 2 04/20/2015 Document in Chart   Final  . Sodium 04/21/2015 127* 135 - 145 mmol/L Final   DELTA CHECK NOTED  . Potassium 04/21/2015 4.5  3.5 - 5.1 mmol/L Final   DELTA CHECK NOTED  . Chloride 04/21/2015 89* 96 - 112 mmol/L Final  . CO2 04/21/2015 23  19 - 32 mmol/L Final  . Glucose, Bld 04/21/2015 72  70 - 99 mg/dL Final  . BUN 04/21/2015 66* 6 - 23 mg/dL Final   DELTA CHECK NOTED  . Creatinine, Ser 04/21/2015 6.56* 0.50 - 1.10 mg/dL Final   DELTA CHECK NOTED  . Calcium 04/21/2015 8.5  8.4 - 10.5 mg/dL Final  . Phosphorus 04/21/2015 4.7* 2.3 - 4.6 mg/dL Final  . Albumin 04/21/2015 2.6* 3.5 - 5.2 g/dL Final  . GFR calc non Af Amer 04/21/2015 6* >90 mL/min Final  . GFR calc Af Amer 04/21/2015 7* >90 mL/min Final    Comment: (NOTE) The eGFR has been calculated using the CKD EPI equation. This calculation has not been validated in all clinical situations. eGFR's persistently <90 mL/min signify possible Chronic Kidney Disease.   . Anion gap 04/21/2015 15  5 - 15 Final  . WBC 04/21/2015 8.9  4.0 - 10.5 K/uL Final  . RBC 04/21/2015 3.00* 3.87 - 5.11 MIL/uL Final  . Hemoglobin 04/21/2015 9.2* 12.0 - 15.0 g/dL Final  . HCT 04/21/2015 28.1* 36.0 - 46.0 % Final  . MCV 04/21/2015 93.7  78.0 - 100.0 fL Final  . MCH 04/21/2015 30.7  26.0 - 34.0 pg Final  . MCHC 04/21/2015 32.7  30.0 - 36.0 g/dL Final  . RDW 04/21/2015 16.2* 11.5 - 15.5 % Final  . Platelets 04/21/2015 374  150 - 400 K/uL Final  . Glucose-Capillary 04/21/2015 75  70 - 99 mg/dL Final  . Glucose-Capillary 04/21/2015 144* 70 - 99 mg/dL Final  Admission on 04/02/2015, Discharged on 04/02/2015  Component Date Value Ref Range Status  . Sodium 04/02/2015 130* 135 - 145 mmol/L Final  . Potassium 04/02/2015 4.7  3.5 - 5.1 mmol/L Final  . Glucose, Bld 04/02/2015 116* 70 - 99 mg/dL Final  . HCT 04/02/2015 40.0  36.0 - 46.0 % Final  . Hemoglobin 04/02/2015 13.6  12.0 - 15.0 g/dL Final    No results found.   Assessment/Plan   ICD-9-CM ICD-10-CM   1. Knee fracture, left, closed, with routine healing, subsequent encounter V54.16 S82.892D   2. Cardiomyopathy, ischemic-EF 30-35% - stable 414.8 I25.5   3. End stage renal disease on dialysis - stable 585.6 N18.6    V45.11 Z99.2   4. Essential hypertension - stable 401.9 I10   5. PAD (peripheral artery disease) - unchanged 443.9 I73.9   6. Diabetes mellitus with ESRD (end-stage renal disease) - stable 250.40 E13.22    585.6 N18.6   7. Ulcer of great toe, right, with unspecified severity - unchanged 707.15 L97.519   8. FTT (failure to thrive) in adult 783.7 R62.7   9. Blindness with deafness 369.00 H54.0    V49.85 Z73.82    389.9 H91.90     --cont nutritional supplement  --HD per nephro qMWF via  left arm AVF  --fluid restrict 1200cc daily  --PT as indicated  --cont other medications as ordered  --will follow  Jariah Tarkowski S. Perlie Gold  [facility] [address] [place], [place] [address] [phone] Cell (Monday-Friday 8 AM - 5 PM) [phone] After 5 PM and follow prompts

## 2015-06-23 DIAGNOSIS — D631 Anemia in chronic kidney disease: Secondary | ICD-10-CM | POA: Diagnosis not present

## 2015-06-23 DIAGNOSIS — D509 Iron deficiency anemia, unspecified: Secondary | ICD-10-CM | POA: Diagnosis not present

## 2015-06-23 DIAGNOSIS — N186 End stage renal disease: Secondary | ICD-10-CM | POA: Diagnosis not present

## 2015-06-23 DIAGNOSIS — N2581 Secondary hyperparathyroidism of renal origin: Secondary | ICD-10-CM | POA: Diagnosis not present

## 2015-06-23 DIAGNOSIS — E1129 Type 2 diabetes mellitus with other diabetic kidney complication: Secondary | ICD-10-CM | POA: Diagnosis not present

## 2015-06-24 DIAGNOSIS — Z992 Dependence on renal dialysis: Secondary | ICD-10-CM | POA: Diagnosis not present

## 2015-06-24 DIAGNOSIS — E1129 Type 2 diabetes mellitus with other diabetic kidney complication: Secondary | ICD-10-CM | POA: Diagnosis not present

## 2015-06-24 DIAGNOSIS — N186 End stage renal disease: Secondary | ICD-10-CM | POA: Diagnosis not present

## 2015-06-25 DIAGNOSIS — E119 Type 2 diabetes mellitus without complications: Secondary | ICD-10-CM | POA: Diagnosis not present

## 2015-06-25 DIAGNOSIS — M19042 Primary osteoarthritis, left hand: Secondary | ICD-10-CM | POA: Diagnosis not present

## 2015-06-25 DIAGNOSIS — N2581 Secondary hyperparathyroidism of renal origin: Secondary | ICD-10-CM | POA: Diagnosis not present

## 2015-06-25 DIAGNOSIS — F419 Anxiety disorder, unspecified: Secondary | ICD-10-CM | POA: Diagnosis not present

## 2015-06-25 DIAGNOSIS — E785 Hyperlipidemia, unspecified: Secondary | ICD-10-CM | POA: Diagnosis not present

## 2015-06-25 DIAGNOSIS — I509 Heart failure, unspecified: Secondary | ICD-10-CM | POA: Diagnosis not present

## 2015-06-25 DIAGNOSIS — H540 Blindness, both eyes: Secondary | ICD-10-CM | POA: Diagnosis not present

## 2015-06-25 DIAGNOSIS — R262 Difficulty in walking, not elsewhere classified: Secondary | ICD-10-CM | POA: Diagnosis not present

## 2015-06-25 DIAGNOSIS — N186 End stage renal disease: Secondary | ICD-10-CM | POA: Diagnosis not present

## 2015-06-25 DIAGNOSIS — S82002D Unspecified fracture of left patella, subsequent encounter for closed fracture with routine healing: Secondary | ICD-10-CM | POA: Diagnosis not present

## 2015-06-25 DIAGNOSIS — F4320 Adjustment disorder, unspecified: Secondary | ICD-10-CM | POA: Diagnosis not present

## 2015-06-25 DIAGNOSIS — N189 Chronic kidney disease, unspecified: Secondary | ICD-10-CM | POA: Diagnosis not present

## 2015-06-25 DIAGNOSIS — F29 Unspecified psychosis not due to a substance or known physiological condition: Secondary | ICD-10-CM | POA: Diagnosis not present

## 2015-06-25 DIAGNOSIS — H913 Deaf nonspeaking, not elsewhere classified: Secondary | ICD-10-CM | POA: Diagnosis not present

## 2015-06-25 DIAGNOSIS — E1129 Type 2 diabetes mellitus with other diabetic kidney complication: Secondary | ICD-10-CM | POA: Diagnosis not present

## 2015-06-25 DIAGNOSIS — I10 Essential (primary) hypertension: Secondary | ICD-10-CM | POA: Diagnosis not present

## 2015-06-28 DIAGNOSIS — I509 Heart failure, unspecified: Secondary | ICD-10-CM | POA: Diagnosis not present

## 2015-06-28 DIAGNOSIS — N186 End stage renal disease: Secondary | ICD-10-CM | POA: Diagnosis not present

## 2015-06-28 DIAGNOSIS — N2581 Secondary hyperparathyroidism of renal origin: Secondary | ICD-10-CM | POA: Diagnosis not present

## 2015-06-28 DIAGNOSIS — F29 Unspecified psychosis not due to a substance or known physiological condition: Secondary | ICD-10-CM | POA: Diagnosis not present

## 2015-06-28 DIAGNOSIS — H540 Blindness, both eyes: Secondary | ICD-10-CM | POA: Diagnosis not present

## 2015-06-28 DIAGNOSIS — F419 Anxiety disorder, unspecified: Secondary | ICD-10-CM | POA: Diagnosis not present

## 2015-06-28 DIAGNOSIS — E785 Hyperlipidemia, unspecified: Secondary | ICD-10-CM | POA: Diagnosis not present

## 2015-06-28 DIAGNOSIS — E119 Type 2 diabetes mellitus without complications: Secondary | ICD-10-CM | POA: Diagnosis not present

## 2015-06-28 DIAGNOSIS — E1129 Type 2 diabetes mellitus with other diabetic kidney complication: Secondary | ICD-10-CM | POA: Diagnosis not present

## 2015-06-29 DIAGNOSIS — I509 Heart failure, unspecified: Secondary | ICD-10-CM | POA: Diagnosis not present

## 2015-06-29 DIAGNOSIS — F29 Unspecified psychosis not due to a substance or known physiological condition: Secondary | ICD-10-CM | POA: Diagnosis not present

## 2015-06-29 DIAGNOSIS — H540 Blindness, both eyes: Secondary | ICD-10-CM | POA: Diagnosis not present

## 2015-06-29 DIAGNOSIS — E785 Hyperlipidemia, unspecified: Secondary | ICD-10-CM | POA: Diagnosis not present

## 2015-06-29 DIAGNOSIS — E119 Type 2 diabetes mellitus without complications: Secondary | ICD-10-CM | POA: Diagnosis not present

## 2015-06-29 DIAGNOSIS — F419 Anxiety disorder, unspecified: Secondary | ICD-10-CM | POA: Diagnosis not present

## 2015-06-30 DIAGNOSIS — I509 Heart failure, unspecified: Secondary | ICD-10-CM | POA: Diagnosis not present

## 2015-06-30 DIAGNOSIS — H540 Blindness, both eyes: Secondary | ICD-10-CM | POA: Diagnosis not present

## 2015-06-30 DIAGNOSIS — E119 Type 2 diabetes mellitus without complications: Secondary | ICD-10-CM | POA: Diagnosis not present

## 2015-06-30 DIAGNOSIS — N186 End stage renal disease: Secondary | ICD-10-CM | POA: Diagnosis not present

## 2015-06-30 DIAGNOSIS — E785 Hyperlipidemia, unspecified: Secondary | ICD-10-CM | POA: Diagnosis not present

## 2015-06-30 DIAGNOSIS — F419 Anxiety disorder, unspecified: Secondary | ICD-10-CM | POA: Diagnosis not present

## 2015-06-30 DIAGNOSIS — F29 Unspecified psychosis not due to a substance or known physiological condition: Secondary | ICD-10-CM | POA: Diagnosis not present

## 2015-06-30 DIAGNOSIS — E1129 Type 2 diabetes mellitus with other diabetic kidney complication: Secondary | ICD-10-CM | POA: Diagnosis not present

## 2015-06-30 DIAGNOSIS — N2581 Secondary hyperparathyroidism of renal origin: Secondary | ICD-10-CM | POA: Diagnosis not present

## 2015-07-01 DIAGNOSIS — E785 Hyperlipidemia, unspecified: Secondary | ICD-10-CM | POA: Diagnosis not present

## 2015-07-01 DIAGNOSIS — I509 Heart failure, unspecified: Secondary | ICD-10-CM | POA: Diagnosis not present

## 2015-07-01 DIAGNOSIS — F29 Unspecified psychosis not due to a substance or known physiological condition: Secondary | ICD-10-CM | POA: Diagnosis not present

## 2015-07-01 DIAGNOSIS — F419 Anxiety disorder, unspecified: Secondary | ICD-10-CM | POA: Diagnosis not present

## 2015-07-01 DIAGNOSIS — H540 Blindness, both eyes: Secondary | ICD-10-CM | POA: Diagnosis not present

## 2015-07-01 DIAGNOSIS — E119 Type 2 diabetes mellitus without complications: Secondary | ICD-10-CM | POA: Diagnosis not present

## 2015-07-02 DIAGNOSIS — N186 End stage renal disease: Secondary | ICD-10-CM | POA: Diagnosis not present

## 2015-07-02 DIAGNOSIS — E1129 Type 2 diabetes mellitus with other diabetic kidney complication: Secondary | ICD-10-CM | POA: Diagnosis not present

## 2015-07-02 DIAGNOSIS — N2581 Secondary hyperparathyroidism of renal origin: Secondary | ICD-10-CM | POA: Diagnosis not present

## 2015-07-02 DIAGNOSIS — I509 Heart failure, unspecified: Secondary | ICD-10-CM | POA: Insufficient documentation

## 2015-07-05 DIAGNOSIS — I509 Heart failure, unspecified: Secondary | ICD-10-CM | POA: Diagnosis not present

## 2015-07-05 DIAGNOSIS — E785 Hyperlipidemia, unspecified: Secondary | ICD-10-CM | POA: Diagnosis not present

## 2015-07-05 DIAGNOSIS — F419 Anxiety disorder, unspecified: Secondary | ICD-10-CM | POA: Diagnosis not present

## 2015-07-05 DIAGNOSIS — E1129 Type 2 diabetes mellitus with other diabetic kidney complication: Secondary | ICD-10-CM | POA: Diagnosis not present

## 2015-07-05 DIAGNOSIS — E119 Type 2 diabetes mellitus without complications: Secondary | ICD-10-CM | POA: Diagnosis not present

## 2015-07-05 DIAGNOSIS — N2581 Secondary hyperparathyroidism of renal origin: Secondary | ICD-10-CM | POA: Diagnosis not present

## 2015-07-05 DIAGNOSIS — N186 End stage renal disease: Secondary | ICD-10-CM | POA: Diagnosis not present

## 2015-07-05 DIAGNOSIS — F29 Unspecified psychosis not due to a substance or known physiological condition: Secondary | ICD-10-CM | POA: Diagnosis not present

## 2015-07-05 DIAGNOSIS — H540 Blindness, both eyes: Secondary | ICD-10-CM | POA: Diagnosis not present

## 2015-07-06 DIAGNOSIS — F419 Anxiety disorder, unspecified: Secondary | ICD-10-CM | POA: Diagnosis not present

## 2015-07-06 DIAGNOSIS — H540 Blindness, both eyes: Secondary | ICD-10-CM | POA: Diagnosis not present

## 2015-07-06 DIAGNOSIS — F29 Unspecified psychosis not due to a substance or known physiological condition: Secondary | ICD-10-CM | POA: Diagnosis not present

## 2015-07-06 DIAGNOSIS — E785 Hyperlipidemia, unspecified: Secondary | ICD-10-CM | POA: Diagnosis not present

## 2015-07-06 DIAGNOSIS — I509 Heart failure, unspecified: Secondary | ICD-10-CM | POA: Diagnosis not present

## 2015-07-06 DIAGNOSIS — E119 Type 2 diabetes mellitus without complications: Secondary | ICD-10-CM | POA: Diagnosis not present

## 2015-07-07 DIAGNOSIS — N186 End stage renal disease: Secondary | ICD-10-CM | POA: Diagnosis not present

## 2015-07-07 DIAGNOSIS — F29 Unspecified psychosis not due to a substance or known physiological condition: Secondary | ICD-10-CM | POA: Diagnosis not present

## 2015-07-07 DIAGNOSIS — I509 Heart failure, unspecified: Secondary | ICD-10-CM | POA: Diagnosis not present

## 2015-07-07 DIAGNOSIS — N2581 Secondary hyperparathyroidism of renal origin: Secondary | ICD-10-CM | POA: Diagnosis not present

## 2015-07-07 DIAGNOSIS — H540 Blindness, both eyes: Secondary | ICD-10-CM | POA: Diagnosis not present

## 2015-07-07 DIAGNOSIS — E785 Hyperlipidemia, unspecified: Secondary | ICD-10-CM | POA: Diagnosis not present

## 2015-07-07 DIAGNOSIS — E119 Type 2 diabetes mellitus without complications: Secondary | ICD-10-CM | POA: Diagnosis not present

## 2015-07-07 DIAGNOSIS — F419 Anxiety disorder, unspecified: Secondary | ICD-10-CM | POA: Diagnosis not present

## 2015-07-07 DIAGNOSIS — E1129 Type 2 diabetes mellitus with other diabetic kidney complication: Secondary | ICD-10-CM | POA: Diagnosis not present

## 2015-07-08 DIAGNOSIS — I509 Heart failure, unspecified: Secondary | ICD-10-CM | POA: Diagnosis not present

## 2015-07-08 DIAGNOSIS — H540 Blindness, both eyes: Secondary | ICD-10-CM | POA: Diagnosis not present

## 2015-07-08 DIAGNOSIS — F419 Anxiety disorder, unspecified: Secondary | ICD-10-CM | POA: Diagnosis not present

## 2015-07-08 DIAGNOSIS — E119 Type 2 diabetes mellitus without complications: Secondary | ICD-10-CM | POA: Diagnosis not present

## 2015-07-08 DIAGNOSIS — F29 Unspecified psychosis not due to a substance or known physiological condition: Secondary | ICD-10-CM | POA: Diagnosis not present

## 2015-07-08 DIAGNOSIS — E785 Hyperlipidemia, unspecified: Secondary | ICD-10-CM | POA: Diagnosis not present

## 2015-07-09 DIAGNOSIS — N2581 Secondary hyperparathyroidism of renal origin: Secondary | ICD-10-CM | POA: Diagnosis not present

## 2015-07-09 DIAGNOSIS — I509 Heart failure, unspecified: Secondary | ICD-10-CM | POA: Diagnosis not present

## 2015-07-09 DIAGNOSIS — E1129 Type 2 diabetes mellitus with other diabetic kidney complication: Secondary | ICD-10-CM | POA: Diagnosis not present

## 2015-07-09 DIAGNOSIS — F29 Unspecified psychosis not due to a substance or known physiological condition: Secondary | ICD-10-CM | POA: Diagnosis not present

## 2015-07-09 DIAGNOSIS — F419 Anxiety disorder, unspecified: Secondary | ICD-10-CM | POA: Diagnosis not present

## 2015-07-09 DIAGNOSIS — H540 Blindness, both eyes: Secondary | ICD-10-CM | POA: Diagnosis not present

## 2015-07-09 DIAGNOSIS — N186 End stage renal disease: Secondary | ICD-10-CM | POA: Diagnosis not present

## 2015-07-09 DIAGNOSIS — E119 Type 2 diabetes mellitus without complications: Secondary | ICD-10-CM | POA: Diagnosis not present

## 2015-07-09 DIAGNOSIS — E785 Hyperlipidemia, unspecified: Secondary | ICD-10-CM | POA: Diagnosis not present

## 2015-07-12 DIAGNOSIS — E785 Hyperlipidemia, unspecified: Secondary | ICD-10-CM | POA: Diagnosis not present

## 2015-07-12 DIAGNOSIS — I509 Heart failure, unspecified: Secondary | ICD-10-CM | POA: Diagnosis not present

## 2015-07-12 DIAGNOSIS — E119 Type 2 diabetes mellitus without complications: Secondary | ICD-10-CM | POA: Diagnosis not present

## 2015-07-12 DIAGNOSIS — H540 Blindness, both eyes: Secondary | ICD-10-CM | POA: Diagnosis not present

## 2015-07-12 DIAGNOSIS — N2581 Secondary hyperparathyroidism of renal origin: Secondary | ICD-10-CM | POA: Diagnosis not present

## 2015-07-12 DIAGNOSIS — N186 End stage renal disease: Secondary | ICD-10-CM | POA: Diagnosis not present

## 2015-07-12 DIAGNOSIS — E1129 Type 2 diabetes mellitus with other diabetic kidney complication: Secondary | ICD-10-CM | POA: Diagnosis not present

## 2015-07-12 DIAGNOSIS — F419 Anxiety disorder, unspecified: Secondary | ICD-10-CM | POA: Diagnosis not present

## 2015-07-12 DIAGNOSIS — F29 Unspecified psychosis not due to a substance or known physiological condition: Secondary | ICD-10-CM | POA: Diagnosis not present

## 2015-07-13 DIAGNOSIS — F29 Unspecified psychosis not due to a substance or known physiological condition: Secondary | ICD-10-CM | POA: Diagnosis not present

## 2015-07-13 DIAGNOSIS — E785 Hyperlipidemia, unspecified: Secondary | ICD-10-CM | POA: Diagnosis not present

## 2015-07-13 DIAGNOSIS — I509 Heart failure, unspecified: Secondary | ICD-10-CM | POA: Diagnosis not present

## 2015-07-13 DIAGNOSIS — E119 Type 2 diabetes mellitus without complications: Secondary | ICD-10-CM | POA: Diagnosis not present

## 2015-07-13 DIAGNOSIS — H540 Blindness, both eyes: Secondary | ICD-10-CM | POA: Diagnosis not present

## 2015-07-13 DIAGNOSIS — F419 Anxiety disorder, unspecified: Secondary | ICD-10-CM | POA: Diagnosis not present

## 2015-07-14 DIAGNOSIS — E785 Hyperlipidemia, unspecified: Secondary | ICD-10-CM | POA: Diagnosis not present

## 2015-07-14 DIAGNOSIS — F29 Unspecified psychosis not due to a substance or known physiological condition: Secondary | ICD-10-CM | POA: Diagnosis not present

## 2015-07-14 DIAGNOSIS — E119 Type 2 diabetes mellitus without complications: Secondary | ICD-10-CM | POA: Diagnosis not present

## 2015-07-14 DIAGNOSIS — N2581 Secondary hyperparathyroidism of renal origin: Secondary | ICD-10-CM | POA: Diagnosis not present

## 2015-07-14 DIAGNOSIS — F419 Anxiety disorder, unspecified: Secondary | ICD-10-CM | POA: Diagnosis not present

## 2015-07-14 DIAGNOSIS — H540 Blindness, both eyes: Secondary | ICD-10-CM | POA: Diagnosis not present

## 2015-07-14 DIAGNOSIS — I509 Heart failure, unspecified: Secondary | ICD-10-CM | POA: Diagnosis not present

## 2015-07-14 DIAGNOSIS — E1129 Type 2 diabetes mellitus with other diabetic kidney complication: Secondary | ICD-10-CM | POA: Diagnosis not present

## 2015-07-14 DIAGNOSIS — N186 End stage renal disease: Secondary | ICD-10-CM | POA: Diagnosis not present

## 2015-07-15 DIAGNOSIS — E785 Hyperlipidemia, unspecified: Secondary | ICD-10-CM | POA: Diagnosis not present

## 2015-07-15 DIAGNOSIS — H540 Blindness, both eyes: Secondary | ICD-10-CM | POA: Diagnosis not present

## 2015-07-15 DIAGNOSIS — F419 Anxiety disorder, unspecified: Secondary | ICD-10-CM | POA: Diagnosis not present

## 2015-07-15 DIAGNOSIS — F29 Unspecified psychosis not due to a substance or known physiological condition: Secondary | ICD-10-CM | POA: Diagnosis not present

## 2015-07-15 DIAGNOSIS — E119 Type 2 diabetes mellitus without complications: Secondary | ICD-10-CM | POA: Diagnosis not present

## 2015-07-15 DIAGNOSIS — I509 Heart failure, unspecified: Secondary | ICD-10-CM | POA: Diagnosis not present

## 2015-07-16 DIAGNOSIS — N186 End stage renal disease: Secondary | ICD-10-CM | POA: Diagnosis not present

## 2015-07-16 DIAGNOSIS — F419 Anxiety disorder, unspecified: Secondary | ICD-10-CM | POA: Diagnosis not present

## 2015-07-16 DIAGNOSIS — N2581 Secondary hyperparathyroidism of renal origin: Secondary | ICD-10-CM | POA: Diagnosis not present

## 2015-07-16 DIAGNOSIS — F29 Unspecified psychosis not due to a substance or known physiological condition: Secondary | ICD-10-CM | POA: Diagnosis not present

## 2015-07-16 DIAGNOSIS — H540 Blindness, both eyes: Secondary | ICD-10-CM | POA: Diagnosis not present

## 2015-07-16 DIAGNOSIS — I509 Heart failure, unspecified: Secondary | ICD-10-CM | POA: Diagnosis not present

## 2015-07-16 DIAGNOSIS — E785 Hyperlipidemia, unspecified: Secondary | ICD-10-CM | POA: Diagnosis not present

## 2015-07-16 DIAGNOSIS — E119 Type 2 diabetes mellitus without complications: Secondary | ICD-10-CM | POA: Diagnosis not present

## 2015-07-16 DIAGNOSIS — E1129 Type 2 diabetes mellitus with other diabetic kidney complication: Secondary | ICD-10-CM | POA: Diagnosis not present

## 2015-07-19 DIAGNOSIS — F419 Anxiety disorder, unspecified: Secondary | ICD-10-CM | POA: Diagnosis not present

## 2015-07-19 DIAGNOSIS — E119 Type 2 diabetes mellitus without complications: Secondary | ICD-10-CM | POA: Diagnosis not present

## 2015-07-19 DIAGNOSIS — N186 End stage renal disease: Secondary | ICD-10-CM | POA: Diagnosis not present

## 2015-07-19 DIAGNOSIS — I509 Heart failure, unspecified: Secondary | ICD-10-CM | POA: Diagnosis not present

## 2015-07-19 DIAGNOSIS — F29 Unspecified psychosis not due to a substance or known physiological condition: Secondary | ICD-10-CM | POA: Diagnosis not present

## 2015-07-19 DIAGNOSIS — N2581 Secondary hyperparathyroidism of renal origin: Secondary | ICD-10-CM | POA: Diagnosis not present

## 2015-07-19 DIAGNOSIS — H540 Blindness, both eyes: Secondary | ICD-10-CM | POA: Diagnosis not present

## 2015-07-19 DIAGNOSIS — E1129 Type 2 diabetes mellitus with other diabetic kidney complication: Secondary | ICD-10-CM | POA: Diagnosis not present

## 2015-07-19 DIAGNOSIS — E785 Hyperlipidemia, unspecified: Secondary | ICD-10-CM | POA: Diagnosis not present

## 2015-07-20 DIAGNOSIS — H540 Blindness, both eyes: Secondary | ICD-10-CM | POA: Diagnosis not present

## 2015-07-20 DIAGNOSIS — E785 Hyperlipidemia, unspecified: Secondary | ICD-10-CM | POA: Diagnosis not present

## 2015-07-20 DIAGNOSIS — E119 Type 2 diabetes mellitus without complications: Secondary | ICD-10-CM | POA: Diagnosis not present

## 2015-07-20 DIAGNOSIS — F29 Unspecified psychosis not due to a substance or known physiological condition: Secondary | ICD-10-CM | POA: Diagnosis not present

## 2015-07-20 DIAGNOSIS — F419 Anxiety disorder, unspecified: Secondary | ICD-10-CM | POA: Diagnosis not present

## 2015-07-20 DIAGNOSIS — I509 Heart failure, unspecified: Secondary | ICD-10-CM | POA: Diagnosis not present

## 2015-07-21 DIAGNOSIS — E119 Type 2 diabetes mellitus without complications: Secondary | ICD-10-CM | POA: Diagnosis not present

## 2015-07-21 DIAGNOSIS — N186 End stage renal disease: Secondary | ICD-10-CM | POA: Diagnosis not present

## 2015-07-21 DIAGNOSIS — N2581 Secondary hyperparathyroidism of renal origin: Secondary | ICD-10-CM | POA: Diagnosis not present

## 2015-07-21 DIAGNOSIS — E785 Hyperlipidemia, unspecified: Secondary | ICD-10-CM | POA: Diagnosis not present

## 2015-07-21 DIAGNOSIS — F419 Anxiety disorder, unspecified: Secondary | ICD-10-CM | POA: Diagnosis not present

## 2015-07-21 DIAGNOSIS — H540 Blindness, both eyes: Secondary | ICD-10-CM | POA: Diagnosis not present

## 2015-07-21 DIAGNOSIS — E1129 Type 2 diabetes mellitus with other diabetic kidney complication: Secondary | ICD-10-CM | POA: Diagnosis not present

## 2015-07-21 DIAGNOSIS — F29 Unspecified psychosis not due to a substance or known physiological condition: Secondary | ICD-10-CM | POA: Diagnosis not present

## 2015-07-21 DIAGNOSIS — I509 Heart failure, unspecified: Secondary | ICD-10-CM | POA: Diagnosis not present

## 2015-07-23 DIAGNOSIS — F29 Unspecified psychosis not due to a substance or known physiological condition: Secondary | ICD-10-CM | POA: Diagnosis not present

## 2015-07-23 DIAGNOSIS — I509 Heart failure, unspecified: Secondary | ICD-10-CM | POA: Diagnosis not present

## 2015-07-23 DIAGNOSIS — E785 Hyperlipidemia, unspecified: Secondary | ICD-10-CM | POA: Diagnosis not present

## 2015-07-23 DIAGNOSIS — E119 Type 2 diabetes mellitus without complications: Secondary | ICD-10-CM | POA: Diagnosis not present

## 2015-07-23 DIAGNOSIS — E1129 Type 2 diabetes mellitus with other diabetic kidney complication: Secondary | ICD-10-CM | POA: Diagnosis not present

## 2015-07-23 DIAGNOSIS — H540 Blindness, both eyes: Secondary | ICD-10-CM | POA: Diagnosis not present

## 2015-07-23 DIAGNOSIS — F419 Anxiety disorder, unspecified: Secondary | ICD-10-CM | POA: Diagnosis not present

## 2015-07-23 DIAGNOSIS — N186 End stage renal disease: Secondary | ICD-10-CM | POA: Diagnosis not present

## 2015-07-23 DIAGNOSIS — N2581 Secondary hyperparathyroidism of renal origin: Secondary | ICD-10-CM | POA: Diagnosis not present

## 2015-07-24 DIAGNOSIS — I509 Heart failure, unspecified: Secondary | ICD-10-CM | POA: Diagnosis not present

## 2015-07-24 DIAGNOSIS — H540 Blindness, both eyes: Secondary | ICD-10-CM | POA: Diagnosis not present

## 2015-07-24 DIAGNOSIS — E785 Hyperlipidemia, unspecified: Secondary | ICD-10-CM | POA: Diagnosis not present

## 2015-07-24 DIAGNOSIS — F419 Anxiety disorder, unspecified: Secondary | ICD-10-CM | POA: Diagnosis not present

## 2015-07-24 DIAGNOSIS — F29 Unspecified psychosis not due to a substance or known physiological condition: Secondary | ICD-10-CM | POA: Diagnosis not present

## 2015-07-24 DIAGNOSIS — E119 Type 2 diabetes mellitus without complications: Secondary | ICD-10-CM | POA: Diagnosis not present

## 2015-07-25 DIAGNOSIS — Z992 Dependence on renal dialysis: Secondary | ICD-10-CM | POA: Diagnosis not present

## 2015-07-25 DIAGNOSIS — N186 End stage renal disease: Secondary | ICD-10-CM | POA: Diagnosis not present

## 2015-07-25 DIAGNOSIS — E1129 Type 2 diabetes mellitus with other diabetic kidney complication: Secondary | ICD-10-CM | POA: Diagnosis not present

## 2015-07-26 DIAGNOSIS — I70208 Unspecified atherosclerosis of native arteries of extremities, other extremity: Secondary | ICD-10-CM | POA: Diagnosis present

## 2015-07-26 DIAGNOSIS — R262 Difficulty in walking, not elsewhere classified: Secondary | ICD-10-CM | POA: Diagnosis not present

## 2015-07-26 DIAGNOSIS — I120 Hypertensive chronic kidney disease with stage 5 chronic kidney disease or end stage renal disease: Secondary | ICD-10-CM | POA: Diagnosis present

## 2015-07-26 DIAGNOSIS — E785 Hyperlipidemia, unspecified: Secondary | ICD-10-CM | POA: Diagnosis present

## 2015-07-26 DIAGNOSIS — M898X9 Other specified disorders of bone, unspecified site: Secondary | ICD-10-CM | POA: Diagnosis present

## 2015-07-26 DIAGNOSIS — F29 Unspecified psychosis not due to a substance or known physiological condition: Secondary | ICD-10-CM | POA: Diagnosis not present

## 2015-07-26 DIAGNOSIS — I5032 Chronic diastolic (congestive) heart failure: Secondary | ICD-10-CM | POA: Diagnosis not present

## 2015-07-26 DIAGNOSIS — F209 Schizophrenia, unspecified: Secondary | ICD-10-CM | POA: Diagnosis not present

## 2015-07-26 DIAGNOSIS — N2581 Secondary hyperparathyroidism of renal origin: Secondary | ICD-10-CM | POA: Diagnosis present

## 2015-07-26 DIAGNOSIS — I10 Essential (primary) hypertension: Secondary | ICD-10-CM | POA: Diagnosis not present

## 2015-07-26 DIAGNOSIS — S82002D Unspecified fracture of left patella, subsequent encounter for closed fracture with routine healing: Secondary | ICD-10-CM | POA: Diagnosis not present

## 2015-07-26 DIAGNOSIS — H9190 Unspecified hearing loss, unspecified ear: Secondary | ICD-10-CM | POA: Diagnosis present

## 2015-07-26 DIAGNOSIS — Z89432 Acquired absence of left foot: Secondary | ICD-10-CM | POA: Diagnosis not present

## 2015-07-26 DIAGNOSIS — Z992 Dependence on renal dialysis: Secondary | ICD-10-CM | POA: Diagnosis not present

## 2015-07-26 DIAGNOSIS — F419 Anxiety disorder, unspecified: Secondary | ICD-10-CM | POA: Diagnosis present

## 2015-07-26 DIAGNOSIS — I2510 Atherosclerotic heart disease of native coronary artery without angina pectoris: Secondary | ICD-10-CM | POA: Diagnosis present

## 2015-07-26 DIAGNOSIS — F4320 Adjustment disorder, unspecified: Secondary | ICD-10-CM | POA: Diagnosis not present

## 2015-07-26 DIAGNOSIS — N189 Chronic kidney disease, unspecified: Secondary | ICD-10-CM | POA: Diagnosis not present

## 2015-07-26 DIAGNOSIS — N186 End stage renal disease: Secondary | ICD-10-CM | POA: Diagnosis present

## 2015-07-26 DIAGNOSIS — L97519 Non-pressure chronic ulcer of other part of right foot with unspecified severity: Secondary | ICD-10-CM | POA: Diagnosis present

## 2015-07-26 DIAGNOSIS — I509 Heart failure, unspecified: Secondary | ICD-10-CM | POA: Diagnosis not present

## 2015-07-26 DIAGNOSIS — Z7902 Long term (current) use of antithrombotics/antiplatelets: Secondary | ICD-10-CM | POA: Diagnosis not present

## 2015-07-26 DIAGNOSIS — Z7982 Long term (current) use of aspirin: Secondary | ICD-10-CM | POA: Diagnosis not present

## 2015-07-26 DIAGNOSIS — Z79891 Long term (current) use of opiate analgesic: Secondary | ICD-10-CM | POA: Diagnosis not present

## 2015-07-26 DIAGNOSIS — D649 Anemia, unspecified: Secondary | ICD-10-CM | POA: Diagnosis present

## 2015-07-26 DIAGNOSIS — F22 Delusional disorders: Secondary | ICD-10-CM | POA: Diagnosis present

## 2015-07-26 DIAGNOSIS — J209 Acute bronchitis, unspecified: Secondary | ICD-10-CM | POA: Diagnosis not present

## 2015-07-26 DIAGNOSIS — I739 Peripheral vascular disease, unspecified: Secondary | ICD-10-CM | POA: Diagnosis not present

## 2015-07-26 DIAGNOSIS — H913 Deaf nonspeaking, not elsewhere classified: Secondary | ICD-10-CM | POA: Diagnosis not present

## 2015-07-26 DIAGNOSIS — E119 Type 2 diabetes mellitus without complications: Secondary | ICD-10-CM | POA: Diagnosis not present

## 2015-07-26 DIAGNOSIS — E1122 Type 2 diabetes mellitus with diabetic chronic kidney disease: Secondary | ICD-10-CM | POA: Diagnosis present

## 2015-07-26 DIAGNOSIS — M19042 Primary osteoarthritis, left hand: Secondary | ICD-10-CM | POA: Diagnosis not present

## 2015-07-26 DIAGNOSIS — K5909 Other constipation: Secondary | ICD-10-CM | POA: Diagnosis not present

## 2015-07-26 DIAGNOSIS — H540 Blindness, both eyes: Secondary | ICD-10-CM | POA: Diagnosis present

## 2015-07-26 DIAGNOSIS — I252 Old myocardial infarction: Secondary | ICD-10-CM | POA: Diagnosis not present

## 2015-07-26 DIAGNOSIS — I4891 Unspecified atrial fibrillation: Secondary | ICD-10-CM | POA: Diagnosis present

## 2015-07-26 DIAGNOSIS — M868X7 Other osteomyelitis, ankle and foot: Secondary | ICD-10-CM | POA: Diagnosis present

## 2015-07-26 DIAGNOSIS — Z79899 Other long term (current) drug therapy: Secondary | ICD-10-CM | POA: Diagnosis not present

## 2015-07-26 DIAGNOSIS — E1322 Other specified diabetes mellitus with diabetic chronic kidney disease: Secondary | ICD-10-CM | POA: Diagnosis not present

## 2015-07-26 DIAGNOSIS — I472 Ventricular tachycardia: Secondary | ICD-10-CM | POA: Diagnosis not present

## 2015-07-26 DIAGNOSIS — I5022 Chronic systolic (congestive) heart failure: Secondary | ICD-10-CM | POA: Diagnosis present

## 2015-07-27 ENCOUNTER — Non-Acute Institutional Stay (SKILLED_NURSING_FACILITY): Payer: Medicare Other | Admitting: Adult Health

## 2015-07-27 ENCOUNTER — Encounter: Payer: Self-pay | Admitting: Adult Health

## 2015-07-27 DIAGNOSIS — R627 Adult failure to thrive: Secondary | ICD-10-CM

## 2015-07-27 DIAGNOSIS — E1122 Type 2 diabetes mellitus with diabetic chronic kidney disease: Secondary | ICD-10-CM

## 2015-07-27 DIAGNOSIS — E785 Hyperlipidemia, unspecified: Secondary | ICD-10-CM

## 2015-07-27 DIAGNOSIS — N186 End stage renal disease: Secondary | ICD-10-CM

## 2015-07-27 DIAGNOSIS — I5032 Chronic diastolic (congestive) heart failure: Secondary | ICD-10-CM

## 2015-07-27 DIAGNOSIS — K5909 Other constipation: Secondary | ICD-10-CM

## 2015-07-27 DIAGNOSIS — E1322 Other specified diabetes mellitus with diabetic chronic kidney disease: Secondary | ICD-10-CM

## 2015-07-27 DIAGNOSIS — J209 Acute bronchitis, unspecified: Secondary | ICD-10-CM

## 2015-07-27 DIAGNOSIS — I472 Ventricular tachycardia, unspecified: Secondary | ICD-10-CM

## 2015-07-27 DIAGNOSIS — I10 Essential (primary) hypertension: Secondary | ICD-10-CM | POA: Diagnosis not present

## 2015-07-27 DIAGNOSIS — I739 Peripheral vascular disease, unspecified: Secondary | ICD-10-CM

## 2015-07-27 NOTE — Progress Notes (Signed)
Patient ID: [name], female   DOB: January 28, 1956, 59 y.o.   MRN: [id number]    Facility: [facility]      No Known Allergies  Chief Complaint  Patient presents with  . Medical Management of Chronic Issues    HPI:  She is a [place] term resident of this facility being seen for the management of her chronic illnesses. Overall there is little significant change in her status. She is unable to fully participate in the hpi or ros. There are no nursing concerns at this time.   Past Medical History  Diagnosis Date  . Hyperlipidemia   . Hypertension     a. Has previously refused blood pressure meds.   . Deaf     USE [employer].  Divorced from husband but lives with him. Has daughter but she does not care for her.  . Bilateral leg edema     a. Chronic.  Marland Kitchen Psychiatric disorder     She frequently exhibits paranoia and has been diagnosed with psychotic d/o NOS during hospital stay in the past. She is tangetial and perseverative during her visits. She apparently has had a bad experience with mental health in [place] in the past and refuses to discuss mental issues for fear that she will be sent back there. Her paranoia, communication issues, financial woes and lack of fam  . Fibroid uterus   . Anemia     Due to fibroids.  . Heart murmur   . Diabetes mellitus     a. Per PCP note 2012 (A1C 9.2) - pt unwilling to take meds and was educated on risk of uncontrolled DM.  b. A1C 5.9 in 11/2013.  Marland Kitchen Coronary artery disease   . Blind   . ESRD on hemodialysis     Mon, Wed, Fri  . CHF (congestive heart failure)   . MI (myocardial infarction) 11/2014  . Essential hypertension 08/09/2009    Qualifier: Diagnosis of  By: Sarita Haver  MD, Coralyn Mark      Past Surgical History  Procedure Laterality Date  . No past surgeries    . Insertion of dialysis catheter Right 01/02/2014    Procedure: INSERTION OF DIALYSIS CATHETER;  Surgeon: Rosetta Posner, MD;  Location: [place];  Service: Vascular;   Laterality: Right;  . Av fistula placement Left 01/02/2014    Procedure: ARTERIOVENOUS (AV) FISTULA CREATION;  Surgeon: Rosetta Posner, MD;  Location: [place];  Service: Vascular;  Laterality: Left;  . Amputation Bilateral 04/02/2015    Procedure: Amputation Bilateral Great Toes MTP Joint;  Surgeon: Newt Minion, MD;  Location: [place];  Service: Orthopedics;  Laterality: Bilateral;  . Amputation Left 05/14/2015    Procedure: Left Transmetatarsal Amputation;  Surgeon: Newt Minion, MD;  Location: [place];  Service: Orthopedics;  Laterality: Left;  . Orif patella Left 05/17/2015    Procedure: OPEN REDUCTION INTERNAL (ORIF) FIXATION PATELLA;  Surgeon: Newt Minion, MD;  Location: [place];  Service: Orthopedics;  Laterality: Left;    VITAL SIGNS BP 122/72 mmHg  Pulse 70  Ht _0  (1.727 m)  Wt 178 lb (80.74 kg)  BMI 27.07 kg/m2  SpO2 98%  Patient's Medications  New Prescriptions   No medications on file  Previous Medications   ACETAMINOPHEN (TYLENOL) 325 MG TABLET    Take 650 mg by mouth 3 (three) times daily.    ALBUTEROL (PROVENTIL) (2.5 MG/3ML) 0.083% NEBULIZER SOLUTION    Take 2.5 mg by nebulization every 2 (  two) hours as needed for wheezing or shortness of breath.   AMINO ACIDS-PROTEIN HYDROLYS (FEEDING SUPPLEMENT, PRO-STAT SUGAR FREE 64,) LIQD    Take 30 mLs by mouth 3 (three) times daily with meals.   AMIODARONE (PACERONE) 200 MG TABLET    Take 200 mg by mouth daily.   ASPIRIN 81 MG TABLET    Take 81 mg by mouth daily. For hypertension   ATORVASTATIN (LIPITOR) 10 MG TABLET    Take 10 mg by mouth daily.   CALCIUM ACETATE (PHOSLO) 667 MG CAPSULE    Take 1 capsule (667 mg total) by mouth 3 (three) times daily with meals.   CLOPIDOGREL (PLAVIX) 75 MG TABLET    Take 1 tablet (75 mg total) by mouth daily.   GUAIFENESIN (MUCINEX) 600 MG 12 HR TABLET    Take 1 tablet (600 mg total) by mouth 2 (two) times daily.   HYDROCODONE-ACETAMINOPHEN (NORCO) 10-325 MG PER TABLET    Take 1 tablet by mouth every 6  (six) hours. AND every 3 hours as needed for pain   LACTULOSE (CHRONULAC) 10 GM/15ML SOLUTION    Take 30 mLs (20 g total) by mouth 2 (two) times daily as needed for moderate constipation.   METOPROLOL SUCCINATE (TOPROL-XL) 50 MG 24 HR TABLET    Take 1 tablet (50 mg total) by mouth daily. Take with or immediately following a meal.   MULTIPLE VITAMIN (MULTIVITAMIN WITH MINERALS) TABS TABLET    Take 2 tablets by mouth daily.   NITROGLYCERIN (NITROSTAT) 0.4 MG SL TABLET    Place 1 tablet (0.4 mg total) under the tongue every 5 (five) minutes as needed for chest pain.   NUTRITIONAL SUPPLEMENTS (FEEDING SUPPLEMENT, NEPRO CARB STEADY,) LIQD    Take 237 mLs by mouth as needed (missed meal during dialysis.).   SENNA-DOCUSATE (SENOKOT-S) 8.6-50 MG PER TABLET    Take 1 tablet by mouth 2 (two) times daily.  Modified Medications   No medications on file  Discontinued Medications   HYDROCODONE-ACETAMINOPHEN (NORCO) 5-325 MG PER TABLET    Take 1 tablet by mouth every 6 (six) hours as needed.     SIGNIFICANT DIAGNOSTIC EXAMS   11-20-14: chest x-ray: 1. Mild to moderate CHF.  11-20-14: 2-d echo: Left ventricle: The cavity size was normal. There was moderate concentric hypertrophy. Systolic function was moderately to severely reduced. The estimated ejection fraction was in the range of 30% to 35%. Dyskinesis and aneurysmal deformity of the mid-apicalanteroseptal, anterior, inferior, inferoseptal, and apical myocardium; consistent with infarction in the distribution of the left anterior descending coronary artery. Features are consistent with a pseudonormal left ventricular filling pattern, with concomitant abnormal relaxation and increased filling pressure - Mitral valve: Severely calcified annulus. - Left atrium: The atrium was moderately dilated. - Pulmonary arteries: Systolic pressure was mildly increased.  11-24-14: Myoview: 1. Fixed defects involving the apex, anterior lateral wall and the inferior wall.  The apex and anterior lateral wall fixed defects aresuggestive for an infarct. No evidence for reversibility or ischemia. 2. Diffuse hypokinesia and minimal wall motion along the lateral wall. 3. Left ventricular ejection fraction is 35%. 4. High-risk stress test findings*.  04-16-15: 2-d echo: - Left ventricle: The cavity size was mildly dilated. Wall thickness was increased in a pattern of mild LVH. Systolic function was moderately to severely reduced. The estimated ejection fraction was in the range of 30% to 35%. There is akinesis of the anteroseptal and apical myocardium. Features are consistent with a pseudonormal left ventricular filling pattern,  with concomitant abnormal relaxation and increased filling pressure (grade 2 diastolic dysfunction). Doppler parameters are consistent with high ventricular filling pressure. - Mitral valve: Calcified annulus. There was mild regurgitation. - Left atrium: The atrium was mildly dilated. - Pulmonary arteries: Systolic pressure was mildly increased.   04-20-15: chest x-ray: Mild congestive heart failure suspected. Small focal opacity right upper lung field favored to be due to atelectasis or early airspace disease secondary to edema. Suggest attention on follow-up.  05-16-15: left knee x-ray: Comminuted fracture involving the lower pole of the patella, with approximately 3 cm of separation at the fracture, and diffuse overlying soft tissue swelling. Associated moderate to large knee joint effusion noted.  05-16-15: left hip and pelvis x-ray: 1. No evidence of fracture or dislocation. 2. Calcified fibroids noted.      LABS REVIEWED:   11-19-14: wbc 10.1; hgb .6; hct 29.1; mcv 93.6; plt 272; glucose 260; bun 83; creat 6.65; k+6.7; na++128; alk phos 142; ast 60; lat 68; t pro 8.6; albumin 3.5 11-21-14: wbc 8.2; hgb 9.0; hct 27.7; mcv 97.2; plt 251; glucose 166; bun 45; creat 4.77; k+5.0; na++134; phos 5.2; albumin 3.2; hgb a1c 6.0 11-24-14: wbc 7.2; hgb  8.6; hct 27.2; mcv 96.5; plt 233; glucose 99; bun 40; creat 4.51; k+4.4; na++132; phos 6.0; albumin 3.0 01-08-15: hgb a1c 5.2  02-13-15; chol 165; ldl 106; trig 46; hdl 50 04-19-15: wbc 9.5; hgb 10.3; hct 31.9; mcv 94.4; plt 400 glucose 108; bun 22; creat 3.75; k+3.6; na++135; ast 38; alt 52; alk phos 183; albumin 3.1; tsh 1.839; hgb a1c 5.4 05-18-15: wbc 9.0; hgb 11.3; hct 36.0; mcv 95.0; plt 485; glucose 91; bun 34; creat 5.34; k+5.1; na++134      Review of Systems  Unable to perform ROS: Other      Physical Exam  Constitutional: She appears well-developed and well-nourished. No distress.  Eyes: Conjunctivae are normal.  Neck: Neck supple. No JVD present. No thyromegaly present.  Cardiovascular: Normal rate, regular rhythm and intact distal pulses.   Respiratory: Effort normal and breath sounds normal. No respiratory distress. She has no wheezes.  GI: Soft. Bowel sounds are normal. She exhibits no distension. There is no tenderness.  Musculoskeletal: She exhibits no edema.  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  Neurological: She is alert.  Skin: Skin is warm and dry. She is not diaphoretic.  Left upper a/v fistula: +thril +bruit  Psychiatric: She has a normal mood and affect.     ASSESSMENT/ PLAN:  1. ESRD on hemodialysis: is followed by nephrology is on hemodialysis three days per week.  Will continue phoslo 667 twice daily on dialysis days and three times daily on other days is on 1200 cc fluid restriction will monitor  2. Dyslipidemia: will continue lipitor 10 mg daily ldl is 106  3. VT sustained: she is not a candidate for icd. Her heart rate is stable will continue amiodarone 200 mg daily and will monitor her status.   4. Diastolic heart failure with EF 30-35%: will continue toprol xl 50 mg daily will continue to monitor her  status.   5. PAD: is status post amputations: will continue asa 81 mg daily and plavix 75 mg daily and will monitor   6. Diabetes: her hgb a1c is 5.4 is presently not on medications; will not make changes will monitor  7. Hypertension: will continue toprol xl 50 mg daily   8. Constipation: will continue lactulose 30 cc 2 times daily as needed and senna s 2 tabs daily   9. Bronchitis: will continue mucinex twice daily and albuterol neb treatment every 2 hours as needed   10. FTT; will continue supplements as directed; her current weight is 178 pounds will monitor   Will check cmp; hgb a1c; lipids   Ok Edwards NP [facility]  Contact [phone] Monday through Friday 8am- 5pm  After hours call [phone]

## 2015-08-09 ENCOUNTER — Other Ambulatory Visit: Payer: Self-pay | Admitting: *Deleted

## 2015-08-09 MED ORDER — HYDROCODONE-ACETAMINOPHEN 10-325 MG PO TABS: ORAL_TABLET | ORAL | Status: DC

## 2015-08-09 NOTE — Telephone Encounter (Signed)
[facility] 

## 2015-08-12 ENCOUNTER — Other Ambulatory Visit (HOSPITAL_COMMUNITY): Payer: Self-pay | Admitting: Orthopedic Surgery

## 2015-08-18 DIAGNOSIS — F209 Schizophrenia, unspecified: Secondary | ICD-10-CM | POA: Diagnosis not present

## 2015-08-19 ENCOUNTER — Encounter (HOSPITAL_COMMUNITY): Payer: Self-pay | Admitting: *Deleted

## 2015-08-19 ENCOUNTER — Inpatient Hospital Stay (HOSPITAL_COMMUNITY)
Admission: EM | Admit: 2015-08-19 | Discharge: 2015-08-23 | DRG: 474 | Disposition: A | Discharge status: Skilled Nursing Facility | Payer: Medicare Other | Attending: Orthopedic Surgery | Admitting: Orthopedic Surgery

## 2015-08-19 DIAGNOSIS — E785 Hyperlipidemia, unspecified: Secondary | ICD-10-CM | POA: Diagnosis present

## 2015-08-19 DIAGNOSIS — I120 Hypertensive chronic kidney disease with stage 5 chronic kidney disease or end stage renal disease: Secondary | ICD-10-CM | POA: Diagnosis not present

## 2015-08-19 DIAGNOSIS — M86179 Other acute osteomyelitis, unspecified ankle and foot: Secondary | ICD-10-CM | POA: Diagnosis present

## 2015-08-19 DIAGNOSIS — I70208 Unspecified atherosclerosis of native arteries of extremities, other extremity: Secondary | ICD-10-CM | POA: Diagnosis present

## 2015-08-19 DIAGNOSIS — Z89432 Acquired absence of left foot: Secondary | ICD-10-CM

## 2015-08-19 DIAGNOSIS — H9190 Unspecified hearing loss, unspecified ear: Secondary | ICD-10-CM | POA: Diagnosis present

## 2015-08-19 DIAGNOSIS — L97519 Non-pressure chronic ulcer of other part of right foot with unspecified severity: Secondary | ICD-10-CM | POA: Diagnosis present

## 2015-08-19 DIAGNOSIS — D649 Anemia, unspecified: Secondary | ICD-10-CM | POA: Diagnosis present

## 2015-08-19 DIAGNOSIS — N186 End stage renal disease: Secondary | ICD-10-CM | POA: Diagnosis present

## 2015-08-19 DIAGNOSIS — F419 Anxiety disorder, unspecified: Secondary | ICD-10-CM | POA: Diagnosis present

## 2015-08-19 DIAGNOSIS — E1122 Type 2 diabetes mellitus with diabetic chronic kidney disease: Secondary | ICD-10-CM | POA: Diagnosis present

## 2015-08-19 DIAGNOSIS — Z79899 Other long term (current) drug therapy: Secondary | ICD-10-CM

## 2015-08-19 DIAGNOSIS — N2581 Secondary hyperparathyroidism of renal origin: Secondary | ICD-10-CM | POA: Diagnosis present

## 2015-08-19 DIAGNOSIS — I5022 Chronic systolic (congestive) heart failure: Secondary | ICD-10-CM | POA: Diagnosis not present

## 2015-08-19 DIAGNOSIS — M898X9 Other specified disorders of bone, unspecified site: Secondary | ICD-10-CM | POA: Diagnosis present

## 2015-08-19 DIAGNOSIS — I4891 Unspecified atrial fibrillation: Secondary | ICD-10-CM | POA: Diagnosis not present

## 2015-08-19 DIAGNOSIS — H540 Blindness, both eyes: Secondary | ICD-10-CM | POA: Diagnosis present

## 2015-08-19 DIAGNOSIS — F22 Delusional disorders: Secondary | ICD-10-CM | POA: Diagnosis not present

## 2015-08-19 DIAGNOSIS — I252 Old myocardial infarction: Secondary | ICD-10-CM

## 2015-08-19 DIAGNOSIS — M868X7 Other osteomyelitis, ankle and foot: Principal | ICD-10-CM | POA: Diagnosis present

## 2015-08-19 DIAGNOSIS — Z7902 Long term (current) use of antithrombotics/antiplatelets: Secondary | ICD-10-CM

## 2015-08-19 DIAGNOSIS — I2510 Atherosclerotic heart disease of native coronary artery without angina pectoris: Secondary | ICD-10-CM | POA: Diagnosis present

## 2015-08-19 DIAGNOSIS — Z7982 Long term (current) use of aspirin: Secondary | ICD-10-CM

## 2015-08-19 DIAGNOSIS — Z9114 Patient's other noncompliance with medication regimen: Secondary | ICD-10-CM

## 2015-08-19 DIAGNOSIS — Z992 Dependence on renal dialysis: Secondary | ICD-10-CM

## 2015-08-19 DIAGNOSIS — Z79891 Long term (current) use of opiate analgesic: Secondary | ICD-10-CM

## 2015-08-19 LAB — CBC WITH DIFFERENTIAL/PLATELET
Basophils Absolute: 0.1 10*3/uL (ref 0.0–0.1)
Basophils Relative: 2 % — ABNORMAL HIGH (ref 0–1)
Eosinophils Absolute: 0.5 10*3/uL (ref 0.0–0.7)
Eosinophils Relative: 8 % — ABNORMAL HIGH (ref 0–5)
HCT: 35.7 % — ABNORMAL LOW (ref 36.0–46.0)
Hemoglobin: 11.6 g/dL — ABNORMAL LOW (ref 12.0–15.0)
Lymphocytes Relative: 31 % (ref 12–46)
Lymphs Abs: 1.9 10*3/uL (ref 0.7–4.0)
MCH: 29.2 pg (ref 26.0–34.0)
MCHC: 32.5 g/dL (ref 30.0–36.0)
MCV: 89.9 fL (ref 78.0–100.0)
Monocytes Absolute: 0.8 10*3/uL (ref 0.1–1.0)
Monocytes Relative: 13 % — ABNORMAL HIGH (ref 3–12)
Neutro Abs: 2.8 10*3/uL (ref 1.7–7.7)
Neutrophils Relative %: 46 % (ref 43–77)
Platelets: 235 10*3/uL (ref 150–400)
RBC: 3.97 MIL/uL (ref 3.87–5.11)
RDW: 17 % — ABNORMAL HIGH (ref 11.5–15.5)
WBC: 6.1 10*3/uL (ref 4.0–10.5)

## 2015-08-19 LAB — COMPREHENSIVE METABOLIC PANEL
ALT: 16 U/L (ref 14–54)
AST: 15 U/L (ref 15–41)
Albumin: 3.6 g/dL (ref 3.5–5.0)
Alkaline Phosphatase: 117 U/L (ref 38–126)
Anion gap: 11 (ref 5–15)
BUN: 22 mg/dL — ABNORMAL HIGH (ref 6–20)
CO2: 29 mmol/L (ref 22–32)
Calcium: 9.3 mg/dL (ref 8.9–10.3)
Chloride: 94 mmol/L — ABNORMAL LOW (ref 101–111)
Creatinine, Ser: 4.04 mg/dL — ABNORMAL HIGH (ref 0.44–1.00)
GFR calc Af Amer: 13 mL/min — ABNORMAL LOW (ref >60)
GFR calc non Af Amer: 11 mL/min — ABNORMAL LOW (ref >60)
Glucose, Bld: 102 mg/dL — ABNORMAL HIGH (ref 65–99)
Potassium: 3.9 mmol/L (ref 3.5–5.1)
Sodium: 134 mmol/L — ABNORMAL LOW (ref 135–145)
Total Bilirubin: 0.8 mg/dL (ref 0.3–1.2)
Total Protein: 7.7 g/dL (ref 6.5–8.1)

## 2015-08-19 LAB — ETHANOL: Alcohol, Ethyl (B): <5 mg/dL (ref <5)

## 2015-08-19 MED ORDER — NITROGLYCERIN 0.4 MG SL SUBL
0.4000 mg | SUBLINGUAL_TABLET | SUBLINGUAL | Status: DC | PRN
  Administered 2015-08-22: 0.4 mg via SUBLINGUAL
  Filled 2015-08-19: qty 1

## 2015-08-19 MED ORDER — LORAZEPAM 1 MG PO TABS
2.0000 mg | ORAL_TABLET | Freq: Once | ORAL | Status: AC
  Administered 2015-08-19: 2 mg via ORAL
  Filled 2015-08-19: qty 2

## 2015-08-19 MED ORDER — GUAIFENESIN ER 600 MG PO TB12
600.0000 mg | ORAL_TABLET | Freq: Two times a day (BID) | ORAL | Status: DC
  Administered 2015-08-20 – 2015-08-22 (×4): 600 mg via ORAL
  Filled 2015-08-19 (×8): qty 1

## 2015-08-19 MED ORDER — LORAZEPAM 1 MG PO TABS
1.0000 mg | ORAL_TABLET | Freq: Once | ORAL | Status: AC
  Administered 2015-08-19: 1 mg via ORAL
  Filled 2015-08-19: qty 1

## 2015-08-19 MED ORDER — CALCIUM ACETATE (PHOS BINDER) 667 MG PO CAPS
667.0000 mg | ORAL_CAPSULE | Freq: Three times a day (TID) | ORAL | Status: DC
  Administered 2015-08-20 – 2015-08-23 (×5): 667 mg via ORAL
  Filled 2015-08-19 (×11): qty 1

## 2015-08-19 MED ORDER — RISPERIDONE 1 MG/ML PO SOLN
0.2500 mg | Freq: Every evening | ORAL | Status: DC
  Administered 2015-08-20 – 2015-08-22 (×2): 0.25 mg via ORAL
  Filled 2015-08-19 (×6): qty 0.25

## 2015-08-19 MED ORDER — LACTULOSE 10 GM/15ML PO SOLN
20.0000 g | Freq: Two times a day (BID) | ORAL | Status: DC
  Administered 2015-08-20 – 2015-08-22 (×4): 20 g via ORAL
  Filled 2015-08-19 (×8): qty 30

## 2015-08-19 MED ORDER — CHLORHEXIDINE GLUCONATE 4 % EX LIQD: 60.0000 mL | Freq: Once | CUTANEOUS | Status: DC

## 2015-08-19 MED ORDER — METOPROLOL SUCCINATE ER 50 MG PO TB24
50.0000 mg | ORAL_TABLET | Freq: Every day | ORAL | Status: DC
  Administered 2015-08-20 – 2015-08-23 (×3): 50 mg via ORAL
  Filled 2015-08-19: qty 1
  Filled 2015-08-19: qty 2
  Filled 2015-08-19: qty 1
  Filled 2015-08-19: qty 2
  Filled 2015-08-19 (×2): qty 1

## 2015-08-19 MED ORDER — ATORVASTATIN CALCIUM 10 MG PO TABS
10.0000 mg | ORAL_TABLET | Freq: Every day | ORAL | Status: DC
  Administered 2015-08-21 – 2015-08-23 (×3): 10 mg via ORAL
  Filled 2015-08-19 (×5): qty 1

## 2015-08-19 MED ORDER — ASPIRIN 81 MG PO CHEW
81.0000 mg | CHEWABLE_TABLET | Freq: Every day | ORAL | Status: DC
  Administered 2015-08-22: 81 mg via ORAL
  Filled 2015-08-19 (×4): qty 1

## 2015-08-19 MED ORDER — CLOPIDOGREL BISULFATE 75 MG PO TABS
75.0000 mg | ORAL_TABLET | Freq: Every day | ORAL | Status: DC
  Administered 2015-08-19 – 2015-08-23 (×3): 75 mg via ORAL
  Filled 2015-08-19 (×5): qty 1

## 2015-08-19 MED ORDER — AMIODARONE HCL 100 MG PO TABS
200.0000 mg | ORAL_TABLET | Freq: Every day | ORAL | Status: DC
Start: 2015-08-20 — End: 2015-08-23
  Administered 2015-08-20 – 2015-08-23 (×3): 200 mg via ORAL
  Filled 2015-08-19 (×2): qty 2
  Filled 2015-08-19: qty 1
  Filled 2015-08-19 (×2): qty 2

## 2015-08-19 MED ORDER — SENNOSIDES-DOCUSATE SODIUM 8.6-50 MG PO TABS
1.0000 | ORAL_TABLET | Freq: Two times a day (BID) | ORAL | Status: DC
  Administered 2015-08-20 – 2015-08-22 (×4): 1 via ORAL
  Filled 2015-08-19 (×8): qty 1

## 2015-08-19 MED ORDER — CEFAZOLIN SODIUM-DEXTROSE 2-3 GM-% IV SOLR
2.0000 g | INTRAVENOUS | Status: AC
  Administered 2015-08-20: 2 g via INTRAVENOUS
  Filled 2015-08-19: qty 50

## 2015-08-19 NOTE — ED Notes (Signed)
TTS in room talking with pt via interpreter

## 2015-08-19 NOTE — [facility] Counselor (Signed)
Spoke with pt through [employer] using tactile ASL.  Asked for permission to call her sister, Hilda Blades, and daughter, Lavella Hammock, to get additional information regarding her hx and what happened today before she came to the hospital. Pt gave permission to call her sister and daughter.  Witnessed by [employer].  Faylene Kurtz, MS, CRC, [place] Triage Specialist [facility]

## 2015-08-19 NOTE — BH Assessment (Addendum)
Tele Assessment Note   [name] is an 59 y.o.divorced female who was brought into the hospital today by [facility] from her nursing home, [facility].  Information for this assessment was obtained from pt (through tactile ASL interpreter Elsie Lincoln), nurses, pt's daughter Lavella Hammock [place]) and pt's sister Haynes Dage) in addition to hospital records. Pt gave permission to contact her sister and daughter through the interpreter (witnessed by [facility]).Sister sts that pt's nursing home staff reported to her today that pt became agitated and enraged, yelling and screaming at staff and residents due to staff trying to get pt to take her prescribed medications.  Sis sts that nursing home staff told her that pt's agitation started on last Saturday. Once at the [facility], per MD note, pt was paranoid and anxious, agitated and aggressive. During the assessment, pt sts she "is not anxious at all" and asked "who said that."  Pt denies SI and HI.  Due to communication difficulties, pt could not answer whether she was experiencing AVH.  Pt did not understand the question although it was repeated several different ways.   Per pt's daughter, pt has a hx of schizophrenia dating back decades and has been on medication and hospitalized several times.   Per daughter, pt has a habit of going through periods where she does not want to take her medications because she has told her daughter that "voices" tell her that they will not help and will make her failing eyesight worse. Per sister, she confirmed diagnosis of schizophrenia dating back to before the 96s.  Per sister, pt was hospitalized at [facility] 2 X in the late 1980s or early 1990s and "was there for a while." Sister sts that pt periodically would "fly into a rage" thinking others were taking her things, coming after her and wanting to harm her.  Sister sts that pt at times would have sudden mood swings in which she would refuse to take her medication  prescribed for schizophrenia. Per sister in recent years as her hearing and eyesight are failing, she has become physically defensive, "flying into a rage" flailing her arms around to defend herself from others she feels are trying to harm her.  Sister sts she does not distinguish between family, those trying to help her and all others at times.  Sister sts that pt has not experienced any physical, emotional/verbal or sexual abuse as far as she knows and added that she was always sheltered by their mother. Sister sts that pt "is not gonna hurt herself but, when she gets mad she might pick up something to throw it."   Pt was alert, cooperative and pleasant during the assessment.  Pt was dressed in hospital gown and lying in her bed for the assessment. Pt was unable or unwilling to speak but moved normally in interacting with the interpreter.  Pt's thought processes were difficult to assess given the constraint of the interpretation and pt's minimal proficiency in tactile ASL. Pt's mood as stated by her was "fine, I'm fine." Pt denied anxiety and depression.  Pt's affect was flat except for short period where she would rub her eyes and state that they were getting worse.  Her eyes were watering due to her eye condition per pt. Pt was oriented x 4.   Axis I: v71.09 No diagnosis; Psychotic D/O NOS by hx; Schizophrenia by hx Axis II: Deferred Axis III:  Past Medical History  Diagnosis Date  . Hyperlipidemia   . Hypertension  a. Has previously refused blood pressure meds.   . Deaf     USE SIGN INTERPRETER.  Divorced from husband but lives with him. Has daughter but she does not care for her.  . Bilateral leg edema     a. Chronic.  Marland Kitchen Psychiatric disorder     She frequently exhibits paranoia and has been diagnosed with psychotic d/o NOS during hospital stay in the past. She is tangetial and perseverative during her visits. She apparently has had a bad experience with mental health in [place] in the past and  refuses to discuss mental issues for fear that she will be sent back there. Her paranoia, communication issues, financial woes and lack of fam  . Fibroid uterus   . Anemia     Due to fibroids.  . Heart murmur   . Diabetes mellitus     a. Per PCP note 2012 (A1C 9.2) - pt unwilling to take meds and was educated on risk of uncontrolled DM.  b. A1C 5.9 in 11/2013.  Marland Kitchen Coronary artery disease   . Blind   . ESRD on hemodialysis     Mon, Wed, Fri  . CHF (congestive heart failure)   . MI (myocardial infarction) 11/2014  . Essential hypertension 08/09/2009    Qualifier: Diagnosis of  By: Sarita Haver  MD, Coralyn Mark     Axis IV: economic problems, other psychosocial or environmental problems, problems related to social environment, problems with access to health care services and problems with primary support group Axis V: 61-70 mild symptoms  Past Medical History:  Past Medical History  Diagnosis Date  . Hyperlipidemia   . Hypertension     a. Has previously refused blood pressure meds.   . Deaf     USE SIGN INTERPRETER.  Divorced from husband but lives with him. Has daughter but she does not care for her.  . Bilateral leg edema     a. Chronic.  Marland Kitchen Psychiatric disorder     She frequently exhibits paranoia and has been diagnosed with psychotic d/o NOS during hospital stay in the past. She is tangetial and perseverative during her visits. She apparently has had a bad experience with mental health in [place] in the past and refuses to discuss mental issues for fear that she will be sent back there. Her paranoia, communication issues, financial woes and lack of fam  . Fibroid uterus   . Anemia     Due to fibroids.  . Heart murmur   . Diabetes mellitus     a. Per PCP note 2012 (A1C 9.2) - pt unwilling to take meds and was educated on risk of uncontrolled DM.  b. A1C 5.9 in 11/2013.  Marland Kitchen Coronary artery disease   . Blind   . ESRD on hemodialysis     Mon, Wed, Fri  . CHF (congestive heart failure)   . MI  (myocardial infarction) 11/2014  . Essential hypertension 08/09/2009    Qualifier: Diagnosis of  By: Sarita Haver  MD, Coralyn Mark      Past Surgical History  Procedure Laterality Date  . No past surgeries    . Insertion of dialysis catheter Right 01/02/2014    Procedure: INSERTION OF DIALYSIS CATHETER;  Surgeon: Rosetta Posner, MD;  Location: [place];  Service: Vascular;  Laterality: Right;  . Av fistula placement Left 01/02/2014    Procedure: ARTERIOVENOUS (AV) FISTULA CREATION;  Surgeon: Rosetta Posner, MD;  Location: [place];  Service: Vascular;  Laterality: Left;  . Amputation Bilateral  04/02/2015    Procedure: Amputation Bilateral Great Toes MTP Joint;  Surgeon: Newt Minion, MD;  Location: [place];  Service: Orthopedics;  Laterality: Bilateral;  . Amputation Left 05/14/2015    Procedure: Left Transmetatarsal Amputation;  Surgeon: Newt Minion, MD;  Location: [place];  Service: Orthopedics;  Laterality: Left;  . Orif patella Left 05/17/2015    Procedure: OPEN REDUCTION INTERNAL (ORIF) FIXATION PATELLA;  Surgeon: Newt Minion, MD;  Location: [place];  Service: Orthopedics;  Laterality: Left;    Family History:  Family History  Problem Relation Age of Onset  . Other Father     Drowned from fishing?  . Heart disease      Social History:  reports that she has never smoked. She does not have any smokeless tobacco history on file. She reports that she does not drink alcohol or use illicit drugs.  Additional Social History:  Alcohol / Drug Use Prescriptions: See PTA list History of alcohol / drug use?:  (UTA)  CIWA: CIWA-Ar BP: 177/77 mmHg Pulse Rate: 103 COWS:    PATIENT STRENGTHS: (choose at least two) Average or above average intelligence Supportive family/friends  Allergies: No Known Allergies  Home Medications:  (Not in a hospital admission)  OB/GYN Status:  No LMP recorded. Patient is postmenopausal.  General Assessment Data Location of Assessment: [facility] ED TTS Assessment: In system Is this  a Tele or Face-to-Face Assessment?: Tele Assessment Is this an Initial Assessment or a Re-assessment for this encounter?: Initial Assessment Marital status: Divorced [place] name:  (UTA) Is patient pregnant?: No Pregnancy Status: No Living Arrangements: Other (Comment) ([place]) Can pt return to current living arrangement?: Yes Admission Status: Voluntary Is patient capable of signing voluntary admission?: No (pt is deaf and blind) Referral Source: Other (Nursing Home staff) Insurance type: Medicare  Medical Screening Exam ([place]) Medical Exam completed: No (Pt still under medical care)  Crisis Care Plan Living Arrangements: Other (Comment) ([place]) Name of Psychiatrist: [place] Name of Therapist: UTA  Education Status Is patient currently in school?: No Current Grade: na Highest grade of school patient has completed:  (UTA) Name of school: na Contact person: na  Risk to self with the past 6 months Suicidal Ideation: No (Denies) Has patient been a risk to self within the past 6 months prior to admission? : No (Denies) Suicidal Intent: No (Denies) Has patient had any suicidal intent within the past 6 months prior to admission? : No ([place]) Is patient at risk for suicide?: No Suicidal Plan?: No Has patient had any suicidal plan within the past 6 months prior to admission? : No Access to Means: No What has been your use of drugs/alcohol within the last 12 months?: none Previous Attempts/Gestures:  (UTA) How many times?:  (UTA) Other Self Harm Risks:  (UTA) Triggers for Past Attempts: Unknown Intentional Self Injurious Behavior: None Family Suicide History: Unable to assess Recent stressful life event(s): Recent negative physical changes (pt sts her vision is getting worse) Persecutory voices/beliefs?:  (UTA) Depression: No (pt denies symptoms of depression) Depression Symptoms:  (denies) Substance abuse  history and/or treatment for substance abuse?:  (UTA) Suicide prevention information given to non-admitted patients: Not applicable  Risk to Others within the past 6 months Homicidal Ideation: No (Denies) Does patient have any lifetime risk of violence toward others beyond the six months prior to admission? : No (Denies) Thoughts of Harm to Others: No (Denies) Current Homicidal Intent: No  Current Homicidal Plan: No Access to Homicidal Means: No Identified Victim: na History of harm to others?:  (UTA) Assessment of Violence: None Noted (UTA) Violent Behavior Description: na (UTA if any) Does patient have access to weapons?: No (Denies) Criminal Charges Pending?:  (UTA) Does patient have a court date:  (UTA) Is patient on probation?:  (UTA)  Psychosis Hallucinations: None noted Delusions: None noted (difficult to translate properly byt pt denies)  Mental Status Report Appearance/Hygiene: In hospital gown, Unremarkable Eye Contact: Fair Motor Activity: Freedom of movement Speech: Logical/coherent, Unremarkable (Pt does not spoke but communicates via tactile SL) Level of Consciousness: Quiet/awake Mood: Pleasant Affect:  (Content, not at all anxious per pt) Anxiety Level: None (Pt signed she is not al all anxious & asked who told me that) Thought Processes: Coherent, Relevant Judgement: Unable to Assess Orientation: Person, Place, Time, Situation Obsessive Compulsive Thoughts/Behaviors: None  Cognitive Functioning Concentration: Good Memory: Recent Intact, Remote Intact IQ: Average Insight: Fair Impulse Control: Fair Appetite: Fair Weight Loss: 0 Weight Gain: 0 Sleep: No Change Total Hours of Sleep: 12 (Pt signed she thinks she might sleep too much) Vegetative Symptoms: Staying in bed  ADLScreening [facility] Assessment Services) Patient's cognitive ability adequate to safely complete daily activities?: No Patient able to express need for assistance with ADLs?:  Yes Independently performs ADLs?: No  Prior Inpatient Therapy Prior Inpatient Therapy:  (UTA) Prior Therapy Dates: na Prior Therapy Facilty/Provider(s): na Reason for Treatment: na  Prior Outpatient Therapy Prior Outpatient Therapy:  (UTA) Prior Therapy Dates:  (na) Prior Therapy Facilty/Provider(s): na Reason for Treatment: na Does patient have an ACCT team?: Unknown Does patient have Intensive In-House Services?  : No Does patient have [facility] services? : Unknown Does patient have P4CC services?: Unknown  ADL Screening (condition at time of admission) Patient's cognitive ability adequate to safely complete daily activities?: No Patient able to express need for assistance with ADLs?: Yes Independently performs ADLs?: No       Abuse/Neglect Assessment (Assessment to be complete while patient is alone) Physical Abuse:  (UTA) Verbal Abuse:  (UTA) Sexual Abuse:  (UTA) Exploitation of patient/patient's resources:  (UTA) Self-Neglect:  (UTA)     Advance Directives (For Healthcare) Does patient have an advance directive?:  (UTA)    Additional Information 1:1 In Past 12 Months?: No CIRT Risk: No Elopement Risk: No Does patient have medical clearance?: No     Disposition:  Disposition Initial Assessment Completed for this Encounter: Yes Disposition of Patient: Other dispositions (Pending review w [facility] Extender) Other disposition(s): Other (Comment)  Per Arlester Marker, NP: Meets IP criteria. Due to level of care required with her medical conditions and communications difficulties cannot admit her to [facility].  Will seek appropriate outside placement.    Spoke with Dr. Roxanne Mins, [place] at [place] him of recommendation.  He agreed.  Spoke with nurse [place] at [place] of plan.   Faylene Kurtz, MS, CRC, [place] Triage Specialist [facility] T 08/19/2015 8:10 PM

## 2015-08-19 NOTE — ED Provider Notes (Signed)
CSN: [id number]     Arrival date & time 08/19/15  1746 History   First MD Initiated Contact with Patient 08/19/15 1748     Chief Complaint  Patient presents with  . Anxiety     (Consider location/radiation/quality/duration/timing/severity/associated sxs/prior Treatment) HPI Comments: Patient here with increased anxiety and paranoia 1 week. Has not been taking her psychiatric medications probably. Patient only communicates via sign interpreter and one is present. Patient is fixated on pain at her left graft site in antecubital fossa. No evidence of infection there. Is unsure of how [place] she has had this pain before. She denies any suicidal or current thoughts of homicide. When pressed for more details about anxiety she becomes more anxious. Patient is not forthcoming with information. No further history obtainable  Patient is a 59 y.o. female presenting with anxiety. The history is provided by the patient. The history is limited by a language barrier and the condition of the patient. A language interpreter was used.  Anxiety    Past Medical History  Diagnosis Date  . Hyperlipidemia   . Hypertension     a. Has previously refused blood pressure meds.   . Deaf     USE SIGN INTERPRETER.  Divorced from husband but lives with him. Has daughter but she does not care for her.  . Bilateral leg edema     a. Chronic.  Marland Kitchen Psychiatric disorder     She frequently exhibits paranoia and has been diagnosed with psychotic d/o NOS during hospital stay in the past. She is tangetial and perseverative during her visits. She apparently has had a bad experience with mental health in [place] in the past and refuses to discuss mental issues for fear that she will be sent back there. Her paranoia, communication issues, financial woes and lack of fam  . Fibroid uterus   . Anemia     Due to fibroids.  . Heart murmur   . Diabetes mellitus     a. Per PCP note 2012 (A1C 9.2) - pt unwilling to take meds and was educated  on risk of uncontrolled DM.  b. A1C 5.9 in 11/2013.  Marland Kitchen Coronary artery disease   . Blind   . ESRD on hemodialysis     Mon, Wed, Fri  . CHF (congestive heart failure)   . MI (myocardial infarction) 11/2014  . Essential hypertension 08/09/2009    Qualifier: Diagnosis of  By: Sarita Haver  MD, Coralyn Mark     Past Surgical History  Procedure Laterality Date  . No past surgeries    . Insertion of dialysis catheter Right 01/02/2014    Procedure: INSERTION OF DIALYSIS CATHETER;  Surgeon: Rosetta Posner, MD;  Location: [place];  Service: Vascular;  Laterality: Right;  . Av fistula placement Left 01/02/2014    Procedure: ARTERIOVENOUS (AV) FISTULA CREATION;  Surgeon: Rosetta Posner, MD;  Location: [place];  Service: Vascular;  Laterality: Left;  . Amputation Bilateral 04/02/2015    Procedure: Amputation Bilateral Great Toes MTP Joint;  Surgeon: Newt Minion, MD;  Location: [place];  Service: Orthopedics;  Laterality: Bilateral;  . Amputation Left 05/14/2015    Procedure: Left Transmetatarsal Amputation;  Surgeon: Newt Minion, MD;  Location: [place];  Service: Orthopedics;  Laterality: Left;  . Orif patella Left 05/17/2015    Procedure: OPEN REDUCTION INTERNAL (ORIF) FIXATION PATELLA;  Surgeon: Newt Minion, MD;  Location: [place];  Service: Orthopedics;  Laterality: Left;   Family History  Problem Relation Age of  Onset  . Other Father     Drowned from fishing?  . Heart disease     Social History  Substance Use Topics  . Smoking status: Never Smoker   . Smokeless tobacco: None     Comment: Prior dip  . Alcohol Use: No   OB History    Gravida Para Term Preterm AB TAB SAB Ectopic Multiple Living   1 1        1      Review of Systems  Unable to perform ROS     Allergies  Review of patient's allergies indicates no known allergies.  Home Medications   Prior to Admission medications   Medication Sig Start Date End Date Taking? Authorizing Provider  acetaminophen (TYLENOL) 325 MG tablet Take 650 mg by mouth  3 (three) times daily.     Historical Provider, MD  albuterol (PROVENTIL) (2.5 MG/3ML) 0.083% nebulizer solution Take 2.5 mg by nebulization every 2 (two) hours as needed for wheezing or shortness of breath.    Historical Provider, MD  amiodarone (PACERONE) 200 MG tablet Take 200 mg by mouth daily.    Historical Provider, MD  aspirin 81 MG tablet Take 81 mg by mouth daily. For hypertension    Historical Provider, MD  atorvastatin (LIPITOR) 10 MG tablet Take 10 mg by mouth daily.    Historical Provider, MD  calcium acetate (PHOSLO) 667 MG capsule Take 1 capsule (667 mg total) by mouth 3 (three) times daily with meals. Patient taking differently: Take 667 mg by mouth See admin instructions. Take twice daily on M-W-F Take 1 cap with meals on non-dialysis days 01/08/14   Kelvin Cellar, MD  clopidogrel (PLAVIX) 75 MG tablet Take 1 tablet (75 mg total) by mouth daily. 11/26/14   Ripudeep Krystal Eaton, MD  guaiFENesin (MUCINEX) 600 MG 12 hr tablet Take 1 tablet (600 mg total) by mouth 2 (two) times daily. 01/16/15   Samella Parr, NP  HYDROcodone-acetaminophen ([place]) 10-325 MG per tablet Take one tablet by mouth four times daily for pain. Not to exceed 3gm APAP from all sources/24hr 08/09/15   Tiffany L Reed, DO  lactulose (CHRONULAC) 10 GM/15ML solution Take 30 mLs (20 g total) by mouth 2 (two) times daily as needed for moderate constipation. 01/16/15   Samella Parr, NP  metoprolol succinate (TOPROL-XL) 50 MG 24 hr tablet Take 1 tablet (50 mg total) by mouth daily. Take with or immediately following a meal. 11/26/14   Ripudeep Krystal Eaton, MD  Multiple Vitamin (MULTIVITAMIN WITH MINERALS) TABS tablet Take 2 tablets by mouth daily.    Historical Provider, MD  nitroGLYCERIN (NITROSTAT) 0.4 MG SL tablet Place 1 tablet (0.4 mg total) under the tongue every 5 (five) minutes as needed for chest pain. 11/26/14   Ripudeep Krystal Eaton, MD  Nutritional Supplements (FEEDING SUPPLEMENT, NEPRO CARB STEADY,) LIQD Take 237 mLs by mouth as  needed (missed meal during dialysis.). Patient not taking: Reported on 08/19/2015 04/21/15   Geradine Girt, DO  risperiDONE (RISPERDAL) 1 MG/ML oral solution Take 0.25 mg by mouth every evening.    Historical Provider, MD  senna-docusate (SENOKOT-S) 8.6-50 MG per tablet Take 1 tablet by mouth 2 (two) times daily. 05/18/15   Domenic Polite, MD   There were no vitals taken for this visit. Physical Exam  Constitutional: She appears well-developed and well-nourished.  Non-toxic appearance. No distress.  HENT:  Head: Normocephalic and atraumatic.  Eyes: Conjunctivae, EOM and lids are normal. Pupils are equal, round, and  reactive to light.  Neck: Normal range of motion. Neck supple. No tracheal deviation present. No thyroid mass present.  Cardiovascular: Normal rate, regular rhythm and normal heart sounds.  Exam reveals no gallop.   No murmur heard. Pulmonary/Chest: Effort normal and breath sounds normal. No stridor. No respiratory distress. She has no decreased breath sounds. She has no wheezes. She has no rhonchi. She has no rales.  Abdominal: Soft. Normal appearance and bowel sounds are normal. She exhibits no distension. There is no tenderness. There is no rebound and no CVA tenderness.  Musculoskeletal: Normal range of motion. She exhibits no edema or tenderness.  Lower extremities wrapped in gauze without signs of bleeding.  Neurological: She is alert. No cranial nerve deficit or sensory deficit. GCS eye subscore is 4. GCS verbal subscore is 5. GCS motor subscore is 6.  Skin: Skin is warm and dry. No abrasion and no rash noted.  Psychiatric: Her mood appears anxious. Her speech is rapid and/or pressured. She is agitated and aggressive. She expresses no suicidal plans and no homicidal plans.  Nursing note and vitals reviewed.   ED Course  Procedures (including critical care time) Labs Review Labs Reviewed  CBC WITH DIFFERENTIAL/PLATELET  COMPREHENSIVE METABOLIC PANEL  ETHANOL    Imaging  Review No results found. I have personally reviewed and evaluated these images and lab results as part of my medical decision-making.   EKG Interpretation None      MDM   Final diagnoses:  None    She appears to be anxious and paranoid here. Given Ativan 1 mg and is slightly come down. Have counseled did behavior health and they will disposition the patient    Lacretia Leigh, MD 08/19/15 2246

## 2015-08-19 NOTE — ED Notes (Signed)
Pt states "my graft is bothering my eyes"

## 2015-08-19 NOTE — ED Notes (Signed)
Pt educated on reasoning for giving her daily meds and the ativan. Pt agreed to take medicine.

## 2015-08-19 NOTE — ED Notes (Signed)
Pt came by [facility] from [facility] because they state she hasn't taken her meds in 1 week and is becoming more paranoid and anxious, screaming out a lot.  Pt is complaining of her dialysis graft on her left arm not feeling right. Pt has interpreter at bedside.  Vitals 164/74, 92, 97% CBG 92.

## 2015-08-19 NOTE — ED Notes (Signed)
Pt still stating "this thing in my left arm has to come out because it's going to cause me to lose my fingers and it's messing up my eyes"

## 2015-08-19 NOTE — Progress Notes (Signed)
Pt SDW-pre-op call completed by pt nurse [place]. According to [facility] at Dr. Jess Barters office, it is okay for pt to continue Plavix and Aspirin.Pt nurse made aware to stop otc vitamins, herbal medications and NSAID's. Pt currently in the ED.

## 2015-08-19 NOTE — Progress Notes (Signed)
Anesthesia Chart Review: SAME DAY WORK-UP.  Patient is a 59 year old female scheduled for right TMA tomorrow by Dr. Sharol Given.  History includes non-smoker, ESRD on HD (MWF), HLD, HTN, deaf mutism, blind, edema, psychiatric disorder NOS (paranoia), anemia, murmur, CAD, large missed anterior MI (diagnosed 11/20/14), cardiac arrest at SNF (pulseless with no shock advised, CPR X 6 min with ROSC, seconds fo VT in ED) 12/11/14,  ischemic cardiomyopathy (not a candidate for ICD due to co-morbidities 12/01/14), combined diastolic and systolic CHF, DM2 (no meds; A1C 5.4 04/19/15). She has had multiple procedures since 03/2015 including bilateral toe amputations, left TMA (05/14/15), ORIF of left patella 05/17/15. Last cardiology visit was during her 04/18/15 hospitalization for acute on chronic CHF in the setting of hypertensive emergency. Last out-patient office visit was with Tarri Fuller, PA-C/Dr. Debara Pickett on 12/29/14.  MAR is pending from SNF. Last med list in Epic included amiodarone, Lipitor, Phoslo, Plavix, Toprol, Nitro.  05/16/15 EKG: NSR, right atrial enlargement, moderate voltage criteria for LVH, may be normal variant, anteroseptal infarct (old), T wave abnormality, consider lateral ischemia. No significant change since last tracing 03/2015.  04/19/15 Echo: Study Conclusions - Left ventricle: The cavity size was mildly dilated. Wall thickness was increased in a pattern of mild LVH. Systolic function was moderately to severely reduced. The estimated ejection fraction was in the range of 30% to 35%. There is akinesis of the anteroseptal and apical myocardium. Features are consistent with a pseudonormal left ventricular filling pattern, with concomitant abnormal relaxation and increased filling pressure (grade 2 diastolic dysfunction). Doppler parameters are consistent with high ventricular filling pressure. - Mitral valve: Calcified annulus. There was mild regurgitation. - Left atrium: The atrium was mildly dilated. -  Pulmonary arteries: Systolic pressure was mildly increased. PA peak pressure: 47 mm Hg (S). Impressions: Anteroseptal and apical akinesis with overall moderate to severeLV dysfunction; mild LVH and LVE; grade 2 diastolic dysfunctionwith elevated LV filling pressure; no apical thrombus noted using definity; mild LAE; mild MR; mild TR with mildly elevated pulmonary pressure. (12/13/14 comparison echo showed EF 30-35% with severe hypokinesis mid-apical anteroseptal myocardium, mild MR, PA peak pressure 51 mmHg (S). She also had grade 2 diastolic dysfunction by [id number] echo.).  11/24/14 Nuclear stress test:  IMPRESSION: 1. Fixed defects involving the apex, anterior lateral wall and the inferior wall. The apex and anterior lateral wall fixed defects are suggestive for an infarct. No evidence for reversibility or ischemia. 2. Diffuse hypokinesia and minimal wall motion along the lateral wall. 3. Left ventricular ejection fraction is 35%. 4. High-risk stress test findings*. Results reviewed by Dr. Stanford Breed, show wrote on 11/25/14, "Given multiple medical problems plan medical therapy."  04/20/15 1V CXR: IMPRESSION: Mild congestive heart failure suspected. Small focal opacity right upper lung field favored to be due to atelectasis or early airspace disease secondary to edema. Suggest attention on follow-up.  She is for labs on arrival.   Reviewed above with anesthesiologist Dr. Orene Desanctis. Patient is a increased operative risk with her co-morbidities, but has undergone three surgical procedure within the past four months. Further evaluation on the day of surgery by her anesthesiologist to ensure labs are acceptable and no acute changes in her cardiopulmonary status.  George Hugh [facility] Short Stay Center/Anesthesiology Phone [phone] 08/19/2015 2:44 PM

## 2015-08-19 NOTE — ED Notes (Signed)
Pt very aggitated..... Still complaining about dialysis graft and states "I want someone to call the police and I want to leave" dr Zenia Resides notified and 2mg  ativan ordered for patient.

## 2015-08-20 ENCOUNTER — Ambulatory Visit (HOSPITAL_COMMUNITY): Admission: RE | Admit: 2015-08-20 | Payer: Medicare Other | Source: Ambulatory Visit | Admitting: Orthopedic Surgery

## 2015-08-20 ENCOUNTER — Encounter (HOSPITAL_COMMUNITY)
Admission: EM | Disposition: A | Discharge status: Skilled Nursing Facility | Payer: Self-pay | Source: Home / Self Care | Attending: Orthopedic Surgery

## 2015-08-20 ENCOUNTER — Emergency Department (HOSPITAL_COMMUNITY): Payer: Medicare Other | Admitting: Vascular Surgery

## 2015-08-20 ENCOUNTER — Encounter (HOSPITAL_COMMUNITY): Payer: Self-pay | Admitting: *Deleted

## 2015-08-20 DIAGNOSIS — Z9114 Patient's other noncompliance with medication regimen: Secondary | ICD-10-CM

## 2015-08-20 DIAGNOSIS — N186 End stage renal disease: Secondary | ICD-10-CM | POA: Diagnosis not present

## 2015-08-20 DIAGNOSIS — I2510 Atherosclerotic heart disease of native coronary artery without angina pectoris: Secondary | ICD-10-CM | POA: Diagnosis present

## 2015-08-20 DIAGNOSIS — Z992 Dependence on renal dialysis: Secondary | ICD-10-CM | POA: Diagnosis not present

## 2015-08-20 DIAGNOSIS — Z91148 Patient's other noncompliance with medication regimen for other reason: Secondary | ICD-10-CM

## 2015-08-20 DIAGNOSIS — E785 Hyperlipidemia, unspecified: Secondary | ICD-10-CM | POA: Diagnosis present

## 2015-08-20 DIAGNOSIS — I70235 Atherosclerosis of native arteries of right leg with ulceration of other part of foot: Secondary | ICD-10-CM | POA: Diagnosis not present

## 2015-08-20 DIAGNOSIS — E1122 Type 2 diabetes mellitus with diabetic chronic kidney disease: Secondary | ICD-10-CM | POA: Diagnosis present

## 2015-08-20 DIAGNOSIS — Z7902 Long term (current) use of antithrombotics/antiplatelets: Secondary | ICD-10-CM | POA: Diagnosis not present

## 2015-08-20 DIAGNOSIS — I252 Old myocardial infarction: Secondary | ICD-10-CM | POA: Diagnosis not present

## 2015-08-20 DIAGNOSIS — N2581 Secondary hyperparathyroidism of renal origin: Secondary | ICD-10-CM | POA: Diagnosis present

## 2015-08-20 DIAGNOSIS — M86271 Subacute osteomyelitis, right ankle and foot: Secondary | ICD-10-CM | POA: Diagnosis not present

## 2015-08-20 DIAGNOSIS — D649 Anemia, unspecified: Secondary | ICD-10-CM | POA: Diagnosis not present

## 2015-08-20 DIAGNOSIS — I5022 Chronic systolic (congestive) heart failure: Secondary | ICD-10-CM | POA: Diagnosis not present

## 2015-08-20 DIAGNOSIS — I120 Hypertensive chronic kidney disease with stage 5 chronic kidney disease or end stage renal disease: Secondary | ICD-10-CM | POA: Diagnosis not present

## 2015-08-20 DIAGNOSIS — I129 Hypertensive chronic kidney disease with stage 1 through stage 4 chronic kidney disease, or unspecified chronic kidney disease: Secondary | ICD-10-CM | POA: Diagnosis not present

## 2015-08-20 DIAGNOSIS — M86179 Other acute osteomyelitis, unspecified ankle and foot: Secondary | ICD-10-CM | POA: Diagnosis present

## 2015-08-20 DIAGNOSIS — Z79899 Other long term (current) drug therapy: Secondary | ICD-10-CM | POA: Diagnosis not present

## 2015-08-20 DIAGNOSIS — I4891 Unspecified atrial fibrillation: Secondary | ICD-10-CM | POA: Diagnosis present

## 2015-08-20 DIAGNOSIS — M868X7 Other osteomyelitis, ankle and foot: Secondary | ICD-10-CM | POA: Diagnosis not present

## 2015-08-20 DIAGNOSIS — H540 Blindness, both eyes: Secondary | ICD-10-CM | POA: Diagnosis present

## 2015-08-20 DIAGNOSIS — M869 Osteomyelitis, unspecified: Secondary | ICD-10-CM | POA: Diagnosis not present

## 2015-08-20 DIAGNOSIS — F419 Anxiety disorder, unspecified: Secondary | ICD-10-CM | POA: Diagnosis present

## 2015-08-20 DIAGNOSIS — Z7982 Long term (current) use of aspirin: Secondary | ICD-10-CM | POA: Diagnosis not present

## 2015-08-20 DIAGNOSIS — I70208 Unspecified atherosclerosis of native arteries of extremities, other extremity: Secondary | ICD-10-CM | POA: Diagnosis present

## 2015-08-20 DIAGNOSIS — Z89432 Acquired absence of left foot: Secondary | ICD-10-CM | POA: Diagnosis not present

## 2015-08-20 DIAGNOSIS — E1129 Type 2 diabetes mellitus with other diabetic kidney complication: Secondary | ICD-10-CM | POA: Diagnosis not present

## 2015-08-20 DIAGNOSIS — F29 Unspecified psychosis not due to a substance or known physiological condition: Secondary | ICD-10-CM | POA: Diagnosis not present

## 2015-08-20 DIAGNOSIS — F22 Delusional disorders: Secondary | ICD-10-CM

## 2015-08-20 DIAGNOSIS — L97519 Non-pressure chronic ulcer of other part of right foot with unspecified severity: Secondary | ICD-10-CM | POA: Diagnosis present

## 2015-08-20 DIAGNOSIS — I509 Heart failure, unspecified: Secondary | ICD-10-CM | POA: Diagnosis not present

## 2015-08-20 DIAGNOSIS — H9190 Unspecified hearing loss, unspecified ear: Secondary | ICD-10-CM | POA: Diagnosis present

## 2015-08-20 DIAGNOSIS — Z79891 Long term (current) use of opiate analgesic: Secondary | ICD-10-CM | POA: Diagnosis not present

## 2015-08-20 DIAGNOSIS — L03031 Cellulitis of right toe: Secondary | ICD-10-CM | POA: Diagnosis not present

## 2015-08-20 DIAGNOSIS — M898X9 Other specified disorders of bone, unspecified site: Secondary | ICD-10-CM | POA: Diagnosis present

## 2015-08-20 HISTORY — PX: AMPUTATION: SHX166

## 2015-08-20 HISTORY — DX: Osteomyelitis, unspecified: M86.9

## 2015-08-20 LAB — POCT I-STAT 4, (NA,K, GLUC, HGB,HCT)
Glucose, Bld: 78 mg/dL (ref 65–99)
HCT: 42 % (ref 36.0–46.0)
Hemoglobin: 14.3 g/dL (ref 12.0–15.0)
Potassium: 4 mmol/L (ref 3.5–5.1)
Sodium: 134 mmol/L — ABNORMAL LOW (ref 135–145)

## 2015-08-20 LAB — GLUCOSE, CAPILLARY
Glucose-Capillary: 80 mg/dL (ref 65–99)
Glucose-Capillary: 72 mg/dL (ref 65–99)
Glucose-Capillary: 66 mg/dL (ref 65–99)
Glucose-Capillary: 88 mg/dL (ref 65–99)

## 2015-08-20 LAB — SURGICAL PCR SCREEN
MRSA, PCR: NEGATIVE
Staphylococcus aureus: NEGATIVE

## 2015-08-20 SURGERY — AMPUTATION, FOOT, RAY
Anesthesia: General | Site: Foot | Laterality: Right

## 2015-08-20 MED ORDER — 0.9 % SODIUM CHLORIDE (POUR BTL) OPTIME
TOPICAL | Status: DC | PRN
  Administered 2015-08-20: 1000 mL

## 2015-08-20 MED ORDER — METOCLOPRAMIDE HCL 5 MG/ML IJ SOLN: 5.0000 mg | Freq: Three times a day (TID) | INTRAMUSCULAR | Status: DC | PRN

## 2015-08-20 MED ORDER — ONDANSETRON HCL 4 MG/2ML IJ SOLN
4.0000 mg | Freq: Four times a day (QID) | INTRAMUSCULAR | Status: DC | PRN
  Administered 2015-08-21: 4 mg via INTRAVENOUS
  Filled 2015-08-20: qty 2

## 2015-08-20 MED ORDER — DOXERCALCIFEROL 4 MCG/2ML IV SOLN
3.0000 ug | INTRAVENOUS | Status: AC
  Administered 2015-08-21: 3 ug via INTRAVENOUS
  Filled 2015-08-20: qty 2

## 2015-08-20 MED ORDER — ONDANSETRON HCL 4 MG/2ML IJ SOLN
INTRAMUSCULAR | Status: AC
  Filled 2015-08-20: qty 2

## 2015-08-20 MED ORDER — OXYCODONE HCL 5 MG PO TABS
5.0000 mg | ORAL_TABLET | ORAL | Status: DC | PRN
  Administered 2015-08-20 – 2015-08-22 (×3): 10 mg via ORAL
  Filled 2015-08-20 (×3): qty 2

## 2015-08-20 MED ORDER — METOCLOPRAMIDE HCL 5 MG PO TABS: 5.0000 mg | ORAL_TABLET | Freq: Three times a day (TID) | ORAL | Status: DC | PRN

## 2015-08-20 MED ORDER — FENTANYL CITRATE (PF) 100 MCG/2ML IJ SOLN
INTRAMUSCULAR | Status: DC | PRN
  Administered 2015-08-20: 50 ug via INTRAVENOUS

## 2015-08-20 MED ORDER — SODIUM CHLORIDE 0.9 % IV SOLN: 62.5000 mg | INTRAVENOUS | Status: DC

## 2015-08-20 MED ORDER — MUPIROCIN 2 % EX OINT
TOPICAL_OINTMENT | CUTANEOUS | Status: AC
  Filled 2015-08-20: qty 22

## 2015-08-20 MED ORDER — FENTANYL CITRATE (PF) 250 MCG/5ML IJ SOLN
INTRAMUSCULAR | Status: AC
  Filled 2015-08-20: qty 5

## 2015-08-20 MED ORDER — MUPIROCIN 2 % EX OINT
1.0000 "application " | TOPICAL_OINTMENT | Freq: Once | CUTANEOUS | Status: AC
  Administered 2015-08-20: 1 via TOPICAL

## 2015-08-20 MED ORDER — HYDROMORPHONE HCL 1 MG/ML IJ SOLN: 0.2500 mg | INTRAMUSCULAR | Status: DC | PRN

## 2015-08-20 MED ORDER — SODIUM CHLORIDE 0.9 % IV SOLN
INTRAVENOUS | Status: DC
Start: 2015-08-20 — End: 2015-08-23
  Administered 2015-08-20: 18:00:00 via INTRAVENOUS

## 2015-08-20 MED ORDER — ONDANSETRON HCL 4 MG/2ML IJ SOLN
INTRAMUSCULAR | Status: DC | PRN
  Administered 2015-08-20: 4 mg via INTRAVENOUS

## 2015-08-20 MED ORDER — ACETAMINOPHEN 650 MG RE SUPP: 650.0000 mg | Freq: Four times a day (QID) | RECTAL | Status: DC | PRN

## 2015-08-20 MED ORDER — SODIUM CHLORIDE 0.9 % IV SOLN
INTRAVENOUS | Status: DC
  Administered 2015-08-20: 13:00:00 via INTRAVENOUS

## 2015-08-20 MED ORDER — METHOCARBAMOL 1000 MG/10ML IJ SOLN
500.0000 mg | Freq: Four times a day (QID) | INTRAVENOUS | Status: DC | PRN
  Filled 2015-08-20: qty 5

## 2015-08-20 MED ORDER — LIDOCAINE HCL (CARDIAC) 20 MG/ML IV SOLN
INTRAVENOUS | Status: AC
  Filled 2015-08-20: qty 5

## 2015-08-20 MED ORDER — ALBUTEROL SULFATE (2.5 MG/3ML) 0.083% IN NEBU: 2.5000 mg | INHALATION_SOLUTION | RESPIRATORY_TRACT | Status: DC | PRN

## 2015-08-20 MED ORDER — DEXTROSE 50 % IV SOLN
INTRAVENOUS | Status: AC
  Administered 2015-08-20: 25 mL
  Filled 2015-08-20: qty 50

## 2015-08-20 MED ORDER — METHOCARBAMOL 500 MG PO TABS
500.0000 mg | ORAL_TABLET | Freq: Four times a day (QID) | ORAL | Status: DC | PRN
  Administered 2015-08-21 (×2): 500 mg via ORAL
  Filled 2015-08-20 (×2): qty 1

## 2015-08-20 MED ORDER — PROPOFOL 10 MG/ML IV BOLUS
INTRAVENOUS | Status: DC | PRN
  Administered 2015-08-20: 100 mg via INTRAVENOUS

## 2015-08-20 MED ORDER — PROPOFOL 10 MG/ML IV BOLUS
INTRAVENOUS | Status: AC
  Filled 2015-08-20: qty 20

## 2015-08-20 MED ORDER — ACETAMINOPHEN 325 MG PO TABS: 650.0000 mg | ORAL_TABLET | Freq: Four times a day (QID) | ORAL | Status: DC | PRN

## 2015-08-20 MED ORDER — LIDOCAINE HCL (CARDIAC) 20 MG/ML IV SOLN
INTRAVENOUS | Status: DC | PRN
  Administered 2015-08-20: 60 mg via INTRAVENOUS

## 2015-08-20 MED ORDER — RENA-VITE PO TABS
1.0000 | ORAL_TABLET | Freq: Every day | ORAL | Status: DC
  Administered 2015-08-20 – 2015-08-22 (×3): 1 via ORAL
  Filled 2015-08-20 (×3): qty 1

## 2015-08-20 MED ORDER — CEFAZOLIN SODIUM 1-5 GM-% IV SOLN
1.0000 g | Freq: Four times a day (QID) | INTRAVENOUS | Status: AC
  Administered 2015-08-20 – 2015-08-21 (×3): 1 g via INTRAVENOUS
  Filled 2015-08-20 (×3): qty 50

## 2015-08-20 MED ORDER — DOXERCALCIFEROL 4 MCG/2ML IV SOLN
3.0000 ug | INTRAVENOUS | Status: DC
  Filled 2015-08-20: qty 2

## 2015-08-20 MED ORDER — HYDROMORPHONE HCL 1 MG/ML IJ SOLN
1.0000 mg | INTRAMUSCULAR | Status: DC | PRN
  Administered 2015-08-20 – 2015-08-22 (×5): 1 mg via INTRAVENOUS
  Filled 2015-08-20 (×5): qty 1

## 2015-08-20 MED ORDER — ONDANSETRON HCL 4 MG PO TABS: 4.0000 mg | ORAL_TABLET | Freq: Four times a day (QID) | ORAL | Status: DC | PRN

## 2015-08-20 SURGICAL SUPPLY — 30 items
BLADE SAW SGTL MED 73X18.5 STR (BLADE) ×2 IMPLANT
BNDG COHESIVE 4X5 TAN STRL (GAUZE/BANDAGES/DRESSINGS) ×2 IMPLANT
BNDG GAUZE ELAST 4 BULKY (GAUZE/BANDAGES/DRESSINGS) ×2 IMPLANT
COVER SURGICAL LIGHT HANDLE (MISCELLANEOUS) ×4 IMPLANT
DRAPE U-SHAPE 47X51 STRL (DRAPES) ×4 IMPLANT
DRSG ADAPTIC 3X8 NADH LF (GAUZE/BANDAGES/DRESSINGS) ×2 IMPLANT
DRSG PAD ABDOMINAL 8X10 ST (GAUZE/BANDAGES/DRESSINGS) ×2 IMPLANT
DURAPREP 26ML APPLICATOR (WOUND CARE) ×2 IMPLANT
ELECT REM PT RETURN 9FT ADLT (ELECTROSURGICAL) ×2
ELECTRODE REM PT RTRN 9FT ADLT (ELECTROSURGICAL) ×1 IMPLANT
GAUZE SPONGE 4X4 12PLY STRL (GAUZE/BANDAGES/DRESSINGS) ×2 IMPLANT
GLOVE BIOGEL PI IND STRL 9 (GLOVE) ×1 IMPLANT
GLOVE BIOGEL PI INDICATOR 9 (GLOVE) ×1
GLOVE SURG ORTHO 9.0 STRL STRW (GLOVE) ×2 IMPLANT
GOWN STRL REUS W/ TWL LRG LVL3 (GOWN DISPOSABLE) ×1 IMPLANT
GOWN STRL REUS W/ TWL XL LVL3 (GOWN DISPOSABLE) ×2 IMPLANT
GOWN STRL REUS W/TWL LRG LVL3 (GOWN DISPOSABLE) ×2
GOWN STRL REUS W/TWL XL LVL3 (GOWN DISPOSABLE) ×4
KIT BASIN OR (CUSTOM PROCEDURE TRAY) ×2 IMPLANT
KIT ROOM TURNOVER OR (KITS) ×2 IMPLANT
NS IRRIG 1000ML POUR BTL (IV SOLUTION) ×2 IMPLANT
PACK ORTHO EXTREMITY (CUSTOM PROCEDURE TRAY) ×2 IMPLANT
PAD ARMBOARD 7.5X6 YLW CONV (MISCELLANEOUS) ×4 IMPLANT
SPONGE LAP 18X18 X RAY DECT (DISPOSABLE) IMPLANT
STOCKINETTE IMPERVIOUS LG (DRAPES) IMPLANT
SUT ETHILON 2 0 PSLX (SUTURE) ×2 IMPLANT
TOWEL OR 17X24 6PK STRL BLUE (TOWEL DISPOSABLE) ×2 IMPLANT
TOWEL OR 17X26 10 PK STRL BLUE (TOWEL DISPOSABLE) ×2 IMPLANT
UNDERPAD 30X30 INCONTINENT (UNDERPADS AND DIAPERS) ×2 IMPLANT
WATER STERILE IRR 1000ML POUR (IV SOLUTION) IMPLANT

## 2015-08-20 NOTE — Progress Notes (Signed)
Pt. Admitted from [facility] for psychiatric issues yesterday. Patient has subsequently been found to need foot surgery and had this today (right transmetatarsal amputation) and has been admitted to unit 5N. CSW has asked 5N RN to get MD to order/re-consult Psych for re-eval in hopes she is psychiatrically stable and cleared to return to SNF. Per 5N RN, patient will need HD Dialysis Tomorrow. Will await further updates and progress with above.    Eduard Clos, MSW, [place]

## 2015-08-20 NOTE — Progress Notes (Signed)
Orthopedic Tech Progress Note Patient Details:  [name] 02/02/1956 [id number]  Ortho Devices Type of Ortho Device: Postop shoe/boot Ortho Device/Splint Location: RLE Ortho Device/Splint Interventions: Ordered, Application   Braulio Bosch 08/20/2015, 7:37 PM

## 2015-08-20 NOTE — Anesthesia Preprocedure Evaluation (Addendum)
Anesthesia Evaluation  Patient identified by MRN, date of birth, ID band Patient awake    Reviewed: Allergy & Precautions, H&P , NPO status , Patient's Chart, lab work & pertinent test results  Airway Mallampati: II  TM Distance: >3 FB Neck ROM: Full    Dental no notable dental hx. (+) Teeth Intact, Dental Advisory Given   Pulmonary neg pulmonary ROS,  breath sounds clear to auscultation  Pulmonary exam normal       Cardiovascular hypertension, Pt. on medications and Pt. on home beta blockers + CAD, + Past MI, + Peripheral Vascular Disease and +CHF Rhythm:Regular Rate:Normal     Neuro/Psych PSYCHIATRIC DISORDERS negative neurological ROS     GI/Hepatic negative GI ROS, Neg liver ROS,   Endo/Other  diabetes  Renal/GU ESRF and DialysisRenal disease  negative genitourinary   Musculoskeletal   Abdominal   Peds  Hematology negative hematology ROS (+)   Anesthesia Other Findings   Reproductive/Obstetrics negative OB ROS                            Anesthesia Physical Anesthesia Plan  ASA: IV  Anesthesia Plan: General   Post-op Pain Management:    Induction: Intravenous  Airway Management Planned: LMA  Additional Equipment:   Intra-op Plan:   Post-operative Plan: Extubation in OR  Informed Consent: I have reviewed the patients History and Physical, chart, labs and discussed the procedure including the risks, benefits and alternatives for the proposed anesthesia with the patient or authorized representative who has indicated his/her understanding and acceptance.   Dental advisory given  Plan Discussed with: CRNA  Anesthesia Plan Comments:         Anesthesia Quick Evaluation

## 2015-08-20 NOTE — Transfer of Care (Signed)
Immediate Anesthesia Transfer of Care Note  Patient: [name]  Procedure(s) Performed: Procedure(s): Transmetatarsal Amputation Right Foot (Right)  Patient Location: PACU  Anesthesia Type:General  Level of Consciousness: sedated  Airway & Oxygen Therapy: Patient Spontanous Breathing and Patient connected to nasal cannula oxygen  Post-op Assessment: Report given to RN and Post -op Vital signs reviewed and stable  Post vital signs: Reviewed and stable  Last Vitals:  Filed Vitals:   08/20/15 0949  BP: 181/80  Pulse: 93  Temp: 36.8 C  Resp: 16    Complications: No apparent anesthesia complications

## 2015-08-20 NOTE — Consult Note (Signed)
[facility] Renal Consultation Note    Indication for Consultation:  Management of ESRD/hemodialysis; anemia, hypertension/volume and secondary hyperparathyroidism PCP: Resides at [place]  HPI: [name] is a 59 y.o. female with ESRD secondary to HTN/DM on MWF dialysis at [facility]. PMHx also significant for hx MI, EF 30 - 35%,hx left transmet 5/20, patellar fx ORIF 5/23, now admitted following  right transmet by Dr. Sharol Given  due to failed conservative measures of right 2nd toe osteo. Yesterday she had a major psychiatric decompensation and brought to the ED from her NH. In the ED she was complaining about her graft stating "that it needed to come out because it was going to cause her to lose her fingers and all it it was mssing up her eye"  She has been refusing medications for a week.  She was evaluated for  [facility] but not appropriate for inpatient care given complexities of medical situation. She was treated with IV ativan last night and agreed to take medications this am.  According to the interpreter today, she thinks someone put a chip in her arm and that is the cause of all her problems.  At the present, she denies pain, SOB or problems with her left (access) arm.  Review of outpt HD notes she presented below her EDW Wednesday, was kept even and left at a post weight of 77.8 (EDW 78.5)  Pre and post BP were 163/103 and 156/95 respectively.  Past Medical History  Diagnosis Date  . Hyperlipidemia   . Hypertension     a. Has previously refused blood pressure meds.   . Deaf     USE SIGN INTERPRETER.  Divorced from husband but lives with him. Has daughter but she does not care for her.  . Bilateral leg edema     a. Chronic.  Marland Kitchen Psychiatric disorder     She frequently exhibits paranoia and has been diagnosed with psychotic d/o NOS during hospital stay in the past. She is tangetial and perseverative during her visits. She apparently has had a bad experience with mental health in [place]  in the past and refuses to discuss mental issues for fear that she will be sent back there. Her paranoia, communication issues, financial woes and lack of fam  . Fibroid uterus   . Anemia     Due to fibroids.  . Heart murmur   . Diabetes mellitus     a. Per PCP note 2012 (A1C 9.2) - pt unwilling to take meds and was educated on risk of uncontrolled DM.  b. A1C 5.9 in 11/2013.  Marland Kitchen Coronary artery disease   . Blind   . ESRD on hemodialysis     Mon, Wed, Fri  . CHF (congestive heart failure)   . MI (myocardial infarction) 11/2014  . Essential hypertension 08/09/2009    Qualifier: Diagnosis of  By: Sarita Haver  MD, Coralyn Mark    . Osteomyelitis    Past Surgical History  Procedure Laterality Date  . No past surgeries    . Insertion of dialysis catheter Right 01/02/2014    Procedure: INSERTION OF DIALYSIS CATHETER;  Surgeon: Rosetta Posner, MD;  Location: [place];  Service: Vascular;  Laterality: Right;  . Av fistula placement Left 01/02/2014    Procedure: ARTERIOVENOUS (AV) FISTULA CREATION;  Surgeon: Rosetta Posner, MD;  Location: [place];  Service: Vascular;  Laterality: Left;  . Amputation Bilateral 04/02/2015    Procedure: Amputation Bilateral Great Toes MTP Joint;  Surgeon: Meridee Score  V, MD;  Location: [place];  Service: Orthopedics;  Laterality: Bilateral;  . Amputation Left 05/14/2015    Procedure: Left Transmetatarsal Amputation;  Surgeon: Newt Minion, MD;  Location: [place];  Service: Orthopedics;  Laterality: Left;  . Orif patella Left 05/17/2015    Procedure: OPEN REDUCTION INTERNAL (ORIF) FIXATION PATELLA;  Surgeon: Newt Minion, MD;  Location: [place];  Service: Orthopedics;  Laterality: Left;   Family History  Problem Relation Age of Onset  . Other Father     Drowned from fishing?  . Heart disease     Social History:  reports that she has never smoked. She does not have any smokeless tobacco history on file. She reports that she does not drink alcohol or use illicit drugs. No Known  Allergies Prior to Admission medications   Medication Sig Start Date End Date Taking? Authorizing Provider  acetaminophen (TYLENOL) 325 MG tablet Take 650 mg by mouth 3 (three) times daily. Taking every day per [facility]   Yes Historical Provider, MD  albuterol (PROVENTIL) (2.5 MG/3ML) 0.083% nebulizer solution Take 2.5 mg by nebulization every 2 (two) hours as needed for wheezing or shortness of breath.   Yes Historical Provider, MD  amiodarone (PACERONE) 200 MG tablet Take 200 mg by mouth daily.   Yes Historical Provider, MD  aspirin 81 MG tablet Take 81 mg by mouth daily. For hypertension   Yes Historical Provider, MD  atorvastatin (LIPITOR) 10 MG tablet Take 10 mg by mouth daily.   Yes Historical Provider, MD  calcium acetate (PHOSLO) 667 MG capsule Take 1 capsule (667 mg total) by mouth 3 (three) times daily with meals. Patient taking differently: Take 667 mg by mouth See admin instructions. Take twice daily on M-W-F Take 1 cap with meals on non-dialysis days 01/08/14  Yes Kelvin Cellar, MD  clopidogrel (PLAVIX) 75 MG tablet Take 1 tablet (75 mg total) by mouth daily. 11/26/14  Yes Ripudeep Krystal Eaton, MD  guaiFENesin (MUCINEX) 600 MG 12 hr tablet Take 1 tablet (600 mg total) by mouth 2 (two) times daily. 01/16/15  Yes Samella Parr, NP  HYDROcodone-acetaminophen (NORCO) 10-325 MG per tablet Take one tablet by mouth four times daily for pain. Not to exceed 3gm APAP from all sources/24hr 08/09/15  Yes Tiffany L Reed, DO  lactulose (CHRONULAC) 10 GM/15ML solution Take 30 mLs (20 g total) by mouth 2 (two) times daily as needed for moderate constipation. Patient taking differently: Take 20 g by mouth every 12 (twelve) hours.  01/16/15  Yes Samella Parr, NP  metoprolol succinate (TOPROL-XL) 50 MG 24 hr tablet Take 1 tablet (50 mg total) by mouth daily. Take with or immediately following a meal. 11/26/14  Yes Ripudeep Krystal Eaton, MD  Multiple Vitamin (MULTIVITAMIN WITH MINERALS) TABS tablet Take 2 tablets by mouth  daily.   Yes Historical Provider, MD  nitroGLYCERIN (NITROSTAT) 0.4 MG SL tablet Place 1 tablet (0.4 mg total) under the tongue every 5 (five) minutes as needed for chest pain. 11/26/14  Yes Ripudeep Krystal Eaton, MD  risperiDONE (RISPERDAL) 1 MG/ML oral solution Take 0.25 mg by mouth every evening.   Yes Historical Provider, MD  senna-docusate (SENOKOT-S) 8.6-50 MG per tablet Take 1 tablet by mouth 2 (two) times daily. 05/18/15  Yes Domenic Polite, MD  Nutritional Supplements (FEEDING SUPPLEMENT, NEPRO CARB STEADY,) LIQD Take 237 mLs by mouth as needed (missed meal during dialysis.). Patient not taking: Reported on 08/19/2015 04/21/15   Geradine Girt, DO  Current Facility-Administered Medications  Medication Dose Route Frequency Provider Last Rate Last Dose  . 0.9 %  sodium chloride infusion   Intravenous Continuous Roderic Palau, MD 10 mL/hr at 08/20/15 1241    . [MAR Hold] amiodarone (PACERONE) tablet 200 mg  200 mg Oral Daily Lacretia Leigh, MD   200 mg at 08/20/15 0935  . [MAR Hold] aspirin chewable tablet 81 mg  81 mg Oral Daily Lacretia Leigh, MD   Stopped at 08/20/15 [phone]  . [MAR Hold] atorvastatin (LIPITOR) tablet 10 mg  10 mg Oral Daily Lacretia Leigh, MD   10 mg at 08/20/15 [id number]  . [MAR Hold] calcium acetate (PHOSLO) capsule 667 mg  667 mg Oral TID WC Lacretia Leigh, MD   667 mg at 08/20/15 0847  . [MAR Hold] clopidogrel (PLAVIX) tablet 75 mg  75 mg Oral Daily Lacretia Leigh, MD   75 mg at 08/19/15 2301  . [MAR Hold] guaiFENesin (MUCINEX) 12 hr tablet 600 mg  600 mg Oral BID Lacretia Leigh, MD   Stopped at 08/20/15 0021  . HYDROmorphone (DILAUDID) injection 0.25-0.5 mg  0.25-0.5 mg Intravenous Q5 min PRN Roderic Palau, MD      . [place] [facility] Hold] lactulose [facility]) 10 GM/15ML solution 20 g  20 g Oral Q12H Lacretia Leigh, MD   Stopped at 08/20/15 0021  . methocarbamol (ROBAXIN) tablet 500 mg  500 mg Oral Q6H PRN Newt Minion, MD       Or  . methocarbamol (ROBAXIN) 500 mg in dextrose 5 % 50 mL  IVPB  500 mg Intravenous Q6H PRN Newt Minion, MD      . Doug Sou Hold] metoprolol succinate (TOPROL-XL) 24 hr tablet 50 mg  50 mg Oral Daily Lacretia Leigh, MD   50 mg at 08/20/15 0936  . [MAR Hold] nitroGLYCERIN (NITROSTAT) SL tablet 0.4 mg  0.4 mg Sublingual Q5 min PRN Lacretia Leigh, MD      . Doug Sou Hold] risperiDONE (RISPERDAL) 1 MG/ML oral solution 0.25 mg  0.25 mg Oral QPM Lacretia Leigh, MD   Stopped at 08/20/15 0021  . [MAR Hold] senna-docusate (Senokot-S) tablet 1 tablet  1 tablet Oral BID Lacretia Leigh, MD   Stopped at 08/20/15 0022   Facility-Administered Medications Ordered in Other Encounters  Medication Dose Route Frequency Provider Last Rate Last Dose  . chlorhexidine (HIBICLENS) 4 % liquid 4 application  60 mL Topical Once Newt Minion, MD      . mupirocin ointment (BACTROBAN) 2 %            Labs: Basic Metabolic Panel:  Recent Labs Lab 08/19/15 1838 08/20/15 1202  NA 134* 134*  K 3.9 4.0  CL 94*  --   CO2 29  --   GLUCOSE 102* 78  BUN 22*  --   CREATININE 4.04*  --   CALCIUM 9.3  --    Liver Function Tests:  Recent Labs Lab 08/19/15 1838  AST 15  ALT 16  ALKPHOS 117  BILITOT 0.8  PROT 7.7  ALBUMIN 3.6   CBC:  Recent Labs Lab 08/19/15 1838 08/20/15 1202  WBC 6.1  --   NEUTROABS 2.8  --   HGB 11.6* 14.3  HCT 35.7* 42.0  MCV 89.9  --   PLT 235  --    CBG:  Recent Labs Lab 08/20/15 1111 08/20/15 1329 08/20/15 1418 08/20/15 1509  GLUCAP 80 72 66 88    ROS: As per HPI otherwise negative.  Physical Exam: Filed Vitals:  08/20/15 1419 08/20/15 1430 08/20/15 1445 08/20/15 1500  BP: 132/59 148/62 158/70   Pulse: 76 77 81 79  Temp:    98.3 F (36.8 C)  TempSrc:      Resp: 12 14 14 16   Height:      Weight:      SpO2: 99% 96% 95% 92%     General: AAF NAD,  Head: Normocephalic, atraumatic, sclera non-icteric, mucus membranes are moist Neck: Supple. JVD not elevated. Lungs: Clear bilaterally to auscultation without wheezes, rales, or  rhonchi. Breathing is unlabored. Heart: RRR with S1 S2. No murmurs, rubs, or gallops appreciated. Abdomen: Soft, non-tender, non-distended with normoactive bowel sounds. abdominal masses. Lower extremities: right LE wrapped; left LE no edema, transmet well healed Neuro: Alert, deaf, blind,  Psych:  Calm when talking with interpreter Dialysis Access: left upper AVF  Dialysis Orders:  [facility] MWF 4.25 hours 180 400/800 EDW 78.5 2 K 2.5 Ca left upper AVF heparin 9000 Hectorol 3 venofer 50 per week mircera 150 given 8/24 Recent Labs:  Hgb 11.4 24% sat ferritin 1171, iPTH 3906 stable, Ca/P ok  Assessment/Plan: 1. s/p right transmet - per Dr. Sharol Given - no heparin HD  2. ESRD -  MWF - ok to defer HD to Saturday istat K 4 3. Hypertension/volume  - titrate EDW 4. Anemia  - Hgb stable -follow;  ESA due for redose in two weeks, continue weekly Fe 5. Metabolic bone disease -  Continue Hectorol 3 6. Nutrition - renal carb mod diet when eating. 7. DM - per primary  8. Psych - refused meds x 1 week; psych to see; not a [facility] candidate; hopefully she can return to [facility] as she has been relatively stable there prior to refusing her meds.  Her deaf husband also lives there. 9. CAD/chronic systolic CHF EF [id number] 10. Afib - on amiodarone, BB  Myriam Jacobson, PA-C [place] [phone] 08/20/2015, 3:21 PM     Pt seen, examined and agree w A/P as above.  Kelly Splinter MD pager [phone]    cell [phone] 08/21/2015, 8:41 AM

## 2015-08-20 NOTE — ED Notes (Signed)
Hold all meds except amiodarone and metoprolol per anesthesiology.

## 2015-08-20 NOTE — ED Notes (Addendum)
Patient is resting at this time.

## 2015-08-20 NOTE — ED Notes (Signed)
This RN asked Dr. Roxanne Mins if pt would be going to surgery today.  Dr Roxanne Mins made aware that pt received plavix last night as ordered.  Dr Roxanne Mins asked Dr. Sharol Given to be paged to his phone.

## 2015-08-20 NOTE — H&P (Signed)
[name] is an 59 y.o. female.   Chief Complaint: Osteomyelitis ulceration right forefoot. HPI: Patient is a 59 year old woman with diabetes end stage renal disease on dialysis status post foot salvage intervention on the right who presents at this time with progressive infection with failure of conservative wound care.  Past Medical History  Diagnosis Date  . Hyperlipidemia   . Hypertension     a. Has previously refused blood pressure meds.   . Deaf     USE SIGN INTERPRETER.  Divorced from husband but lives with him. Has daughter but she does not care for her.  . Bilateral leg edema     a. Chronic.  Marland Kitchen Psychiatric disorder     She frequently exhibits paranoia and has been diagnosed with psychotic d/o NOS during hospital stay in the past. She is tangetial and perseverative during her visits. She apparently has had a bad experience with mental health in [place] in the past and refuses to discuss mental issues for fear that she will be sent back there. Her paranoia, communication issues, financial woes and lack of fam  . Fibroid uterus   . Anemia     Due to fibroids.  . Heart murmur   . Diabetes mellitus     a. Per PCP note 2012 (A1C 9.2) - pt unwilling to take meds and was educated on risk of uncontrolled DM.  b. A1C 5.9 in 11/2013.  Marland Kitchen Coronary artery disease   . Blind   . ESRD on hemodialysis     Mon, Wed, Fri  . CHF (congestive heart failure)   . MI (myocardial infarction) 11/2014  . Essential hypertension 08/09/2009    Qualifier: Diagnosis of  By: Sarita Haver  MD, Coralyn Mark    . Osteomyelitis     Past Surgical History  Procedure Laterality Date  . No past surgeries    . Insertion of dialysis catheter Right 01/02/2014    Procedure: INSERTION OF DIALYSIS CATHETER;  Surgeon: Rosetta Posner, MD;  Location: [place];  Service: Vascular;  Laterality: Right;  . Av fistula placement Left 01/02/2014    Procedure: ARTERIOVENOUS (AV) FISTULA CREATION;  Surgeon: Rosetta Posner, MD;  Location: [place];   Service: Vascular;  Laterality: Left;  . Amputation Bilateral 04/02/2015    Procedure: Amputation Bilateral Great Toes MTP Joint;  Surgeon: Newt Minion, MD;  Location: [place];  Service: Orthopedics;  Laterality: Bilateral;  . Amputation Left 05/14/2015    Procedure: Left Transmetatarsal Amputation;  Surgeon: Newt Minion, MD;  Location: [place];  Service: Orthopedics;  Laterality: Left;  . Orif patella Left 05/17/2015    Procedure: OPEN REDUCTION INTERNAL (ORIF) FIXATION PATELLA;  Surgeon: Newt Minion, MD;  Location: [place];  Service: Orthopedics;  Laterality: Left;    Family History  Problem Relation Age of Onset  . Other Father     Drowned from fishing?  . Heart disease     Social History:  reports that she has never smoked. She does not have any smokeless tobacco history on file. She reports that she does not drink alcohol or use illicit drugs.  Allergies: No Known Allergies   (Not in a hospital admission)  Results for orders placed or performed during the hospital encounter of 08/19/15 (from the past 48 hour(s))  CBC with Differential     Status: Abnormal   Collection Time: 08/19/15  6:38 PM  Result Value Ref Range   WBC 6.1 4.0 - 10.5 K/uL   RBC  3.97 3.87 - 5.11 MIL/uL   Hemoglobin 11.6 (L) 12.0 - 15.0 g/dL   HCT 35.7 (L) 36.0 - 46.0 %   MCV 89.9 78.0 - 100.0 fL   MCH 29.2 26.0 - 34.0 pg   MCHC 32.5 30.0 - 36.0 g/dL   RDW 17.0 (H) 11.5 - 15.5 %   Platelets 235 150 - 400 K/uL   Neutrophils Relative % 46 43 - 77 %   Neutro Abs 2.8 1.7 - 7.7 K/uL   Lymphocytes Relative 31 12 - 46 %   Lymphs Abs 1.9 0.7 - 4.0 K/uL   Monocytes Relative 13 (H) 3 - 12 %   Monocytes Absolute 0.8 0.1 - 1.0 K/uL   Eosinophils Relative 8 (H) 0 - 5 %   Eosinophils Absolute 0.5 0.0 - 0.7 K/uL   Basophils Relative 2 (H) 0 - 1 %   Basophils Absolute 0.1 0.0 - 0.1 K/uL  Comprehensive metabolic panel     Status: Abnormal   Collection Time: 08/19/15  6:38 PM  Result Value Ref Range   Sodium 134 (L)  135 - 145 mmol/L   Potassium 3.9 3.5 - 5.1 mmol/L   Chloride 94 (L) 101 - 111 mmol/L   CO2 29 22 - 32 mmol/L   Glucose, Bld 102 (H) 65 - 99 mg/dL   BUN 22 (H) 6 - 20 mg/dL   Creatinine, Ser 4.04 (H) 0.44 - 1.00 mg/dL   Calcium 9.3 8.9 - 10.3 mg/dL   Total Protein 7.7 6.5 - 8.1 g/dL   Albumin 3.6 3.5 - 5.0 g/dL   AST 15 15 - 41 U/L   ALT 16 14 - 54 U/L   Alkaline Phosphatase 117 38 - 126 U/L   Total Bilirubin 0.8 0.3 - 1.2 mg/dL   GFR calc non Af Amer 11 (L) >60 mL/min   GFR calc Af Amer 13 (L) >60 mL/min    Comment: (NOTE) The eGFR has been calculated using the CKD EPI equation. This calculation has not been validated in all clinical situations. eGFR's persistently <60 mL/min signify possible Chronic Kidney Disease.    Anion gap 11 5 - 15  Ethanol     Status: None   Collection Time: 08/19/15  6:47 PM  Result Value Ref Range   Alcohol, Ethyl (B) <5 <5 mg/dL    Comment:        LOWEST DETECTABLE LIMIT FOR SERUM ALCOHOL IS 5 mg/dL FOR MEDICAL PURPOSES ONLY    No results found.  Review of Systems  All other systems reviewed and are negative.   Blood pressure 159/90, pulse 93, temperature 98.1 F (36.7 C), temperature source Oral, resp. rate 16, SpO2 100 %. Physical Exam  On examination patient has ulceration osteomyelitis and cellulitis of the right forefoot. Assessment/Plan Assessment: Right forefoot osteomyelitis ulceration.  Plan: We'll plan for right transmetatarsal amputation. Risks and benefits were discussed including risk of the wound not healing need for higher level amputation. Patient states she understands and wishes to proceed at this time.  [name] 08/20/2015, 6:46 AM

## 2015-08-20 NOTE — ED Notes (Signed)
Attempt to call short stay 3x ([phone]) with no answer.

## 2015-08-20 NOTE — Progress Notes (Signed)
Spoke with patient's son Lavett Lips who now states he has spoke to Dr. Sharol Given and gives his consent for patient (mom) to have surgery.

## 2015-08-20 NOTE — ED Notes (Signed)
Patient placed on bedpan; patient used and cleaned herself; washed and dried patient's hands; Shanon Brow, deaf interpreter is at bedside; also took patient's vital signs

## 2015-08-20 NOTE — Progress Notes (Signed)
Pt son called(Maurice Trickel) spoke with him about his mom having surgery, [name] states that he doesn't know anything about the surgery. Dr Sharol Given called and informed, states he will call her son.

## 2015-08-20 NOTE — ED Notes (Signed)
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

## 2015-08-20 NOTE — Anesthesia Procedure Notes (Signed)
Procedure Name: LMA Insertion Date/Time: 08/20/2015 1:42 PM Performed by: Rush Farmer E Pre-anesthesia Checklist: Patient identified, Emergency Drugs available, Suction available, Patient being monitored and Timeout performed Patient Re-evaluated:Patient Re-evaluated prior to inductionOxygen Delivery Method: Circle system utilized Preoxygenation: Pre-oxygenation with 100% oxygen Intubation Type: IV induction LMA: LMA inserted LMA Size: 4.0 Number of attempts: 1 Placement Confirmation: positive ETCO2 and breath sounds checked- equal and bilateral Tube secured with: Tape Dental Injury: Teeth and Oropharynx as per pre-operative assessment

## 2015-08-20 NOTE — ED Provider Notes (Signed)
TTS consultation appreciated. Patient will need to be admitted for psychiatric issues but cannot be managed here because of multiple medical problems as well as being blind and deaf. It was noted on her chart that he she is scheduled for surgery on her foot today. She did receive a dose of Plavix last night. I have discussed the case with her orthopedic surgeon, Dr. Sharol Given, he says that he will plan to operate on her foot today and that it should not wait until after her psychiatric issues are taken care of. She will need to be kept here until has she can have her surgery and can be returned to the ED from the PACU.  Delora Fuel, MD [id number]

## 2015-08-20 NOTE — Progress Notes (Signed)
CSW spoke to [facility] RN, Sunday Spillers, to check on status of getting psych re-eval. She does not think this was done as requested earlier this pm so she is planning to advise MD that we need Psych consult re-called for eval to take place tomorrow- in hopes she will be cleared medically and psychiatrically to return to SNF.     Eduard Clos, MSW, [place]

## 2015-08-20 NOTE — ED Notes (Addendum)
Report given to short-stay RN. Pt transported to short-stay. Will return to ER after surgery to await placement at psych facility.

## 2015-08-20 NOTE — Anesthesia Postprocedure Evaluation (Signed)
  Anesthesia Post-op Note  Patient: [name]  Procedure(s) Performed: Procedure(s) (LRB): Transmetatarsal Amputation Right Foot (Right)  Patient Location: PACU  Anesthesia Type: General  Level of Consciousness: awake and alert   Airway and Oxygen Therapy: Patient Spontanous Breathing  Post-op Pain: mild  Post-op Assessment: Post-op Vital signs reviewed, Patient's Cardiovascular Status Stable, Respiratory Function Stable, Patent Airway and No signs of Nausea or vomiting  Last Vitals:  Filed Vitals:   08/20/15 0949  BP: 181/80  Pulse: 93  Temp: 36.8 C  Resp: 16    Post-op Vital Signs: stable   Complications: No apparent anesthesia complications Glucose 66, 1/2 amp of dextrose given since pt is NPO

## 2015-08-20 NOTE — Op Note (Signed)
08/19/2015 - 08/20/2015  1:58 PM  PATIENT:  [name]    PRE-OPERATIVE DIAGNOSIS:  Osteomyelitis Right 2nd Toe  POST-OPERATIVE DIAGNOSIS:  Same  PROCEDURE:  Transmetatarsal Amputation Right Foot  SURGEON:  Newt Minion, MD  PHYSICIAN ASSISTANT:None ANESTHESIA:   General  PREOPERATIVE INDICATIONS:  [name] is a  59 y.o. female with a diagnosis of Osteomyelitis Right 2nd Toe who failed conservative measures and elected for surgical management.    The risks benefits and alternatives were discussed with the patient preoperatively including but not limited to the risks of infection, bleeding, nerve injury, cardiopulmonary complications, the need for revision surgery, among others, and the patient was willing to proceed.  OPERATIVE IMPLANTS: None  OPERATIVE FINDINGS: Calcified vessels with petechial bleeding  OPERATIVE PROCEDURE: Patient was brought to the operating room and underwent a general and aesthetic. After adequate levels anesthesia obtained patient's right lower extremity was prepped using DuraPrep draped into a sterile field. A timeout was called. A fishmouth incision was made through the forefoot. A transmetatarsal amputation was performed with an oscillating saw. Hemostasis was obtained. The wound was irrigated with normal saline. Incision was closed using 2-0 nylon. A sterile compressive dressing was applied. Patient was extubated taken to the PACU in stable condition.

## 2015-08-20 NOTE — ED Notes (Signed)
Mary at [facility] called to say pt is appropriate for inpt services but not at [facility]  Placement will be sought.

## 2015-08-20 NOTE — ED Notes (Signed)
Per EDP, Dr. Billy Fischer, pt is NPO except for sips w/ daily meds.

## 2015-08-20 NOTE — ED Notes (Signed)
[name] is here to interpret

## 2015-08-20 NOTE — ED Notes (Signed)
Patient laying on stretcher; deaf interpreter at bedside

## 2015-08-20 NOTE — ED Notes (Signed)
Dr. Schlossman at bedside. 

## 2015-08-21 DIAGNOSIS — F29 Unspecified psychosis not due to a substance or known physiological condition: Secondary | ICD-10-CM

## 2015-08-21 LAB — RENAL FUNCTION PANEL
Albumin: 3.3 g/dL — ABNORMAL LOW (ref 3.5–5.0)
Anion gap: 14 (ref 5–15)
BUN: 44 mg/dL — ABNORMAL HIGH (ref 6–20)
CO2: 24 mmol/L (ref 22–32)
Calcium: 8.5 mg/dL — ABNORMAL LOW (ref 8.9–10.3)
Chloride: 91 mmol/L — ABNORMAL LOW (ref 101–111)
Creatinine, Ser: 6.23 mg/dL — ABNORMAL HIGH (ref 0.44–1.00)
GFR calc Af Amer: 8 mL/min — ABNORMAL LOW (ref >60)
GFR calc non Af Amer: 7 mL/min — ABNORMAL LOW (ref >60)
Glucose, Bld: 104 mg/dL — ABNORMAL HIGH (ref 65–99)
Phosphorus: 4.7 mg/dL — ABNORMAL HIGH (ref 2.5–4.6)
Potassium: 4.6 mmol/L (ref 3.5–5.1)
Sodium: 129 mmol/L — ABNORMAL LOW (ref 135–145)

## 2015-08-21 LAB — CBC
HCT: 31.1 % — ABNORMAL LOW (ref 36.0–46.0)
Hemoglobin: 9.9 g/dL — ABNORMAL LOW (ref 12.0–15.0)
MCH: 28.5 pg (ref 26.0–34.0)
MCHC: 31.8 g/dL (ref 30.0–36.0)
MCV: 89.6 fL (ref 78.0–100.0)
Platelets: 200 10*3/uL (ref 150–400)
RBC: 3.47 MIL/uL — ABNORMAL LOW (ref 3.87–5.11)
RDW: 16.9 % — ABNORMAL HIGH (ref 11.5–15.5)
WBC: 7.8 10*3/uL (ref 4.0–10.5)

## 2015-08-21 LAB — GLUCOSE, CAPILLARY: Glucose-Capillary: 99 mg/dL (ref 65–99)

## 2015-08-21 MED ORDER — DOXERCALCIFEROL 4 MCG/2ML IV SOLN
INTRAVENOUS | Status: AC
Start: 2015-08-21 — End: 2015-08-22
  Filled 2015-08-21: qty 2

## 2015-08-21 MED ORDER — HEPARIN SODIUM (PORCINE) 1000 UNIT/ML DIALYSIS: 1000.0000 [IU] | INTRAMUSCULAR | Status: DC | PRN

## 2015-08-21 MED ORDER — ALTEPLASE 2 MG IJ SOLR: 2.0000 mg | Freq: Once | INTRAMUSCULAR | Status: DC | PRN

## 2015-08-21 MED ORDER — PENTAFLUOROPROP-TETRAFLUOROETH EX AERO: 1.0000 "application " | INHALATION_SPRAY | CUTANEOUS | Status: DC | PRN

## 2015-08-21 MED ORDER — LIDOCAINE HCL (PF) 1 % IJ SOLN: 5.0000 mL | INTRAMUSCULAR | Status: DC | PRN

## 2015-08-21 MED ORDER — SODIUM CHLORIDE 0.9 % IV SOLN: 100.0000 mL | INTRAVENOUS | Status: DC | PRN

## 2015-08-21 MED ORDER — LIDOCAINE-PRILOCAINE 2.5-2.5 % EX CREA: 1.0000 "application " | TOPICAL_CREAM | CUTANEOUS | Status: DC | PRN

## 2015-08-21 MED ORDER — NEPRO/CARBSTEADY PO LIQD
237.0000 mL | ORAL | Status: DC | PRN
  Filled 2015-08-21: qty 237

## 2015-08-21 NOTE — Progress Notes (Signed)
[name] Progress Note  Assessment/Plan: 1. s/p right transmet - per Dr. Sharol Given - no heparin HD  2. ESRD - MWF - off schedule - plan HD today after interpreter arrives - need help with explaining the process; too agitated to do safely now 3. Hypertension/volume - titrate EDW 4. Anemia - Hgb stable -follow; ESA due for redose in two weeks, continue weekly Fe 5. Metabolic bone disease - Continue Hectorol 3 6. Nutrition - renal carb mod diet when eating. 7. DM - per primary  8. Psych - refused meds x 1 week; psych to see; not a Jet candidate; hopefully she can return to [facility] as she has been relatively stable there prior to refusing her meds. Her deaf husband also lives there. 9. CAD/chronic systolic CHF EF [id number] 10. Afib - on amiodarone, BB  Myriam Jacobson, PA-C [name] Bee[facility] [phone] 08/21/2015,8:46 AM  LOS: 1 day   Pt seen, examined and agree w A/P as above.  Kelly Splinter MD pager [phone]    cell [phone] 08/21/2015, 2:45 PM    Subjective:  Agitated this am.  Awaiting the interpreter.  Objective Filed Vitals:   08/20/15 1500 08/20/15 1700 08/20/15 2015 08/21/15 0416  BP: 156/75  147/74 161/78  Pulse: 79  79 83  Temp: 98.3 F (36.8 C)  98.8 F (37.1 C) 97.9 F (36.6 C)  TempSrc:   Oral Axillary  Resp: 16  16 16   Height:      Weight:      SpO2: 92% 92% 94% 97%   Physical Exam unable to full examine due to agitation General: agitated, yelling  Heart: Lungs: Abdomen: Extremities: right LE wrapped; no LE edema Dialysis Access: left upper AVF + bruit  Dialysis Orders: [facility] MWF 4.25 hours 180 400/800 EDW 78.5 2 K 2.5 Ca left upper AVF heparin 9000 Hectorol 3 venofer 50 per week mircera 150 given 8/24 Recent Labs: Hgb 11.4 24% sat ferritin 1171, iPTH 3906 stable, Ca/P ok   Additional Objective Labs: Basic Metabolic Panel:  Recent Labs Lab 08/19/15 1838 08/20/15 1202  NA 134* 134*  K 3.9 4.0  CL 94*  --    CO2 29  --   GLUCOSE 102* 78  BUN 22*  --   CREATININE 4.04*  --   CALCIUM 9.3  --    Liver Function Tests:  Recent Labs Lab 08/19/15 1838  AST 15  ALT 16  ALKPHOS 117  BILITOT 0.8  PROT 7.7  ALBUMIN 3.6   CBC:  Recent Labs Lab 08/19/15 1838 08/20/15 1202  WBC 6.1  --   NEUTROABS 2.8  --   HGB 11.6* 14.3  HCT 35.7* 42.0  MCV 89.9  --   PLT 235  --    B CBG:  Recent Labs Lab 08/20/15 1111 08/20/15 1329 08/20/15 1418 08/20/15 1509  GLUCAP 80 72 66 88  Medications: . sodium chloride 10 mL/hr at 08/20/15 1241  . sodium chloride 10 mL/hr at 08/20/15 1753   . amiodarone  200 mg Oral Daily  . aspirin  81 mg Oral Daily  . atorvastatin  10 mg Oral Daily  . calcium acetate  667 mg Oral TID WC  .  ceFAZolin (ANCEF) IV  1 g Intravenous Q6H  . clopidogrel  75 mg Oral Daily  . [START ON 08/23/2015] doxercalciferol  3 mcg Intravenous Q M,W,F-HD  . doxercalciferol  3 mcg Intravenous Q Sat-HD  . [START ON 08/25/2015] ferric gluconate (FERRLECIT/NULECIT) IV  62.5  mg Intravenous Q Wed-HD  . guaiFENesin  600 mg Oral BID  . lactulose  20 g Oral Q12H  . metoprolol succinate  50 mg Oral Daily  . multivitamin  1 tablet Oral QHS  . risperiDONE  0.25 mg Oral QPM  . senna-docusate  1 tablet Oral BID

## 2015-08-21 NOTE — Consult Note (Signed)
  Psychiatric Specialty Exam: Physical Exam  ROS  Blood pressure 179/93, pulse 80, temperature 97.9 F (36.6 C), temperature source Axillary, resp. rate 16, height 5\' 7"  (1.702 m), weight 80.74 kg (178 lb), SpO2 94 %.Body mass index is 27.87 kg/(m^2).  General Appearance: Bizarre  Eye Contact::  Minimal  Speech:  mute  Volume:  Increased  Mood:  Irritable  Affect:  Full Range  Thought Process:  unable to assess  Orientation:  Other:  only to person  Thought Content:  Paranoid Ideation  Suicidal Thoughts:  No  Homicidal Thoughts:  No  Memory:  Immediate;   Fair Recent;   Fair Remote;   Fair  Judgement:  Impaired  Insight:  Lacking  Psychomotor Activity:  Increased  Concentration:  Poor  Recall:  [employer] of Knowledge:  Fair  Language:  mute  Akathisia:  No  Handed:  Right  AIMS (if indicated):     Assets:  Desire for Improvement Social Support  ADL's:  Impaired  Cognition:  alert  Sleep:   fair.    Patient is 59 year old woman who was brought from the Nursing home due to agitation and refusing to take her medications. Patient is blind and deaf. History is difficult to obtain from patient using the normal hospital interpreter but his son was able to serve as the interpreter(Maurice Allmon) in addition to hospital records. Pt gave permission to allow her son serves as Astronomer. Per her nurse patient has refused all treatment including dialysis. However, her son is able to convince her to go for dialysis as we speak and patient is on course to get dialysis today. Per chart, patient was reported to be paranoid, anxious, agitated and aggressive. Fully assessment could not be done because patient is eager to go for dialysis.  Assessment: Unspecified psychosis by history  Plan: Patient encouraged to continue Risperdal 0.25mg  Qhs for agitation and psychosis. Will follow up tomorrow.  Corena Pilgrim, MD 08/21/15

## 2015-08-21 NOTE — Progress Notes (Signed)
This nurse called On-call MD for Dr. Sharol Given for a consult for Psych re-eval for this patient. A phone number was given to the MD to place a call.

## 2015-08-21 NOTE — Progress Notes (Signed)
Patient stable does not appear to be in pain S(tump dressing dry right foot Apparently needs psych consult for readmission to skilled nursing facility which will likely take place early next week

## 2015-08-21 NOTE — Progress Notes (Signed)
Patient is blind and deaf. Pt uses partial sign language and writing the spelling on her hands for communication. Attempted to get an interpreter but failed. Pt is agitated and refusing to take her medications. She also refused doing the dilaysis. She wight that :' nurse gave her the wrong medication and she felt hurting." I  called her son Marce Scurry to to convince her mom in the hospital. He said he would be in the hospital shortly. Will continue to monitor patient.

## 2015-08-21 NOTE — Evaluation (Signed)
Physical Therapy Evaluation Patient Details Name: [name] MRN: [id number] DOB: Nov 22, 1956 Today's Date: 08/21/2015   History of Present Illness  Pt is a 59 y/o F s/p Rt transmetatarsal amputation.  Pt's PMH includes deafness, blindness, Bil leg edema, paranoia, HTN, DM, ESRD, CHF, MI, osteomyelitis.  Clinical Impression  Patient is s/p above surgery resulting in functional limitations due to the deficits listed below (see PT Problem List). Session limited to sitting EOB 2/2 limitations w/ communication (pt blind and deaf) and unable to confirm pt's PLOF.  From previous notes it appears pt has needed total A at her SNF for ADLs, is not ambulatory, but uses WC at mod I level.  Patient will benefit from skilled PT to increase their independence and safety with mobility to allow discharge to the venue listed below.      Follow Up Recommendations SNF;Supervision/Assistance - 24 hour    Equipment Recommendations  None recommended by PT    Recommendations for Other Services       Precautions / Restrictions Precautions Precautions: Fall Precaution Comments: Pt is also blind and deaf.  Not in [place]; however appears to have previous Lt transmetatarsal amputation. Restrictions Weight Bearing Restrictions: Yes RLE Weight Bearing: Non weight bearing      Mobility  Bed Mobility Overal bed mobility: Needs Assistance;+2 for physical assistance Bed Mobility: Supine to Sit;Sit to Supine     Supine to sit: Max assist;HOB elevated;+2 for physical assistance Sit to supine: Max assist;+2 for physical assistance   General bed mobility comments: Max assist to manage Bil LEs and support/direct trunk.  Tactile cues provided for technique and hand placement.  Increased time and use of bed rails.  Transfers                    Ambulation/Gait                Stairs            Wheelchair Mobility    Modified Rankin (Stroke Patients Only)       Balance Overall balance  assessment: Needs assistance Sitting-balance support: Bilateral upper extremity supported;Feet unsupported Sitting balance-Leahy Scale: Fair Sitting balance - Comments: Close min guard as pt is deaf and blind                                     Pertinent Vitals/Pain Pain Assessment: Faces Faces Pain Scale: Hurts even more Pain Location: Rt LE Pain Descriptors / Indicators: Grimacing;Moaning Pain Intervention(s): Limited activity within patient's tolerance;Monitored during session;Repositioned    Home Living Family/patient expects to be discharged to:: Skilled nursing facility Living Arrangements: Other (Comment) ([place])               Additional Comments: Pt is from SNF where she lives w/ her husband.      Prior Function Level of Independence: Needs assistance   Gait / Transfers Assistance Needed: From previous PT note: Per representative from [place] living, pt total A for ADLs. Requires assist to transfer to w/c and Mod I for w/c mobility. Pt is non ambulatory.     Comments: Pt unable to provide extensive history.  Attempted to speak w/ son about pt's prior level of function; however pt's son extremely agitated that he was not contacted prior to surgery as he is the POA and not his sister.  He refuses to provide information to  PT and requests to speak w/ the director.  Floor director notified.     Hand Dominance        Extremity/Trunk Assessment   Upper Extremity Assessment: Overall WFL for tasks assessed           Lower Extremity Assessment: RLE deficits/detail;LLE deficits/detail RLE Deficits / Details: s/p Rt transmetatarsal amputation LLE Deficits / Details: pt appears to have had previous Lt transmetatarsal amputation     Communication   Communication: Deaf;Other (comment) (No interpreter available at the time.  See general notes )  Cognition Arousal/Alertness: Awake/alert Behavior During Therapy:  Restless;Anxious Overall Cognitive Status: Difficult to assess                      General Comments General comments (skin integrity, edema, etc.): To communicate used graphesthesia to write out "sit" and "lay down" and pt communicated back w/ same method.      Exercises        Assessment/Plan    PT Assessment Patient needs continued PT services  PT Diagnosis Difficulty walking;Acute pain;Generalized weakness   PT Problem List Decreased strength;Decreased range of motion;Decreased activity tolerance;Decreased balance;Decreased mobility;Decreased knowledge of use of DME;Decreased safety awareness;Decreased knowledge of precautions;Decreased skin integrity;Pain  PT Treatment Interventions Functional mobility training;Gait training;DME instruction;Therapeutic activities;Therapeutic exercise;Balance training;Neuromuscular re-education;Patient/family education;Wheelchair mobility training;Modalities   PT Goals (Current goals can be found in the Care Plan section) Acute Rehab PT Goals Patient Stated Goal: to get to a "wheelchair" PT Goal Formulation: With patient Time For Goal Achievement: 09/04/15 Potential to Achieve Goals: Good    Frequency Min 3X/week   Barriers to discharge        Co-evaluation               End of Session   Activity Tolerance: Patient tolerated treatment well Patient left: in bed;with call bell/phone within reach;with SCD's reapplied Nurse Communication: Mobility status;Precautions;Weight bearing status         Time: [id number] PT Time Calculation (min) (ACUTE ONLY): 19 min   Charges:   PT Evaluation $Initial PT Evaluation Tier I: 1 Procedure     PT G CodesJoslyn Hy PT, DPT [phone] Pager: [phone] 08/21/2015, 4:02 PM

## 2015-08-21 NOTE — Progress Notes (Signed)
Patient's son got here in pm . He told me that he is the power of attorney, but nobody notified him regarding the surgical procedure. He felt frustrated by not communicating well. He stated that sign language interpreter has limitation since her mom is blind as well. He was able to convince her to go for dialysis and taking some of her meds.Per chart, patient was reported to be paranoid, anxious, agitated and aggressive. Patient's son would like to support and help her mom to be more cooperative by communicating her mom with hands and touching.

## 2015-08-22 LAB — HEPATITIS B SURFACE ANTIGEN: Hepatitis B Surface Ag: NEGATIVE

## 2015-08-22 NOTE — Clinical Social Work Note (Signed)
FL-2 completed and clinicals faxed for patient's return on Monday, 8/29. FL-2 to be placed on chart for MD signature.   CSW remains available as needed.   Glendon Axe, MSW, LCSWA [phone] 08/22/2015 1:00 PM

## 2015-08-22 NOTE — Progress Notes (Signed)
Subjective: Patient stable resting comfortably   Objective: Vital signs in last 24 hours: Temp:  [97.4 F (36.3 C)-98.3 F (36.8 C)] 98.3 F (36.8 C) (08/28 0431) Pulse Rate:  [78-113] 79 (08/28 0431) Resp:  [15-18] 18 (08/28 0431) BP: (112-179)/(61-98) 162/71 mmHg (08/28 0431) SpO2:  [93 %-98 %] 98 % (08/28 0431) Weight:  [74.9 kg (165 lb 2 oz)-78.4 kg (172 lb 13.5 oz)] 74.9 kg (165 lb 2 oz) (08/27 1923)  Intake/Output from previous day: 08/27 0701 - 08/28 0700 In: -  Out: 2300  Intake/Output this shift:    Exam:  Dressing intact  Labs:  Recent Labs  08/19/15 1838 08/20/15 1202 08/21/15 1632  HGB 11.6* 14.3 9.9*    Recent Labs  08/19/15 1838 08/20/15 1202 08/21/15 1632  WBC 6.1  --  7.8  RBC 3.97  --  3.47*  HCT 35.7* 42.0 31.1*  PLT 235  --  200    Recent Labs  08/19/15 1838 08/20/15 1202 08/21/15 1632  NA 134* 134* 129*  K 3.9 4.0 4.6  CL 94*  --  91*  CO2 29  --  24  BUN 22*  --  44*  CREATININE 4.04*  --  6.23*  GLUCOSE 102* 78 104*  CALCIUM 9.3  --  8.5*   No results for input(s): LABPT, INR in the last 72 hours.  Assessment/Plan: Ready for placement tomorrow   [name] 08/22/2015, 9:32 AM

## 2015-08-22 NOTE — Progress Notes (Addendum)
[facility] Progress Note  Assessment: 1. s/p right transmet - per Dr. Sharol Given - no heparin HD  2. ESRD -HD Monday 3. Hypertension/volume - 3kg under dry wt, BP's up 4. Anemia - Hgb stable -follow; ESA due for redose in two weeks, continue weekly Fe 5. Metabolic bone disease - Continue Hectorol 3 6. Nutrition - renal carb mod diet when eating. 7. DM - per primary  8. Psych - refused meds x 1 week; psych to see; not a [place] candidate; hopefully she can return to [facility] as she has been relatively stable there prior to refusing her meds. Her deaf husband also lives there. 9. CAD/chronic systolic CHF EF [id number] 10. Afib - on amiodarone, BB  Plan - HD Monday, lower dry wt further, tight hep  Kelly Splinter MD pager [phone]    cell [phone] 08/22/2015, 2:29 PM    Subjective:  Agitated this am.  Awaiting the interpreter.  Objective Filed Vitals:   08/22/15 0431 08/22/15 1008 08/22/15 1220 08/22/15 1243  BP: 162/71 173/82 175/78 176/78  Pulse: 79 80 80 78  Temp: 98.3 F (36.8 C)     TempSrc: Oral     Resp: 18  16   Height:      Weight:      SpO2: 98%  99%    Physical Exam unable to full examine due to agitation General: agitated, yelling  Heart: Lungs: Abdomen: Extremities: right LE wrapped; no LE edema Dialysis Access: left upper AVF + bruit  Dialysis Orders: [facility] MWF 4.25 hours 180 400/800 EDW 78.5 2 K 2.5 Ca left upper AVF heparin 9000 Hectorol 3 venofer 50 per week mircera 150 given 8/24 Recent Labs: Hgb 11.4 24% sat ferritin 1171, iPTH 3906 stable, Ca/P ok   Additional Objective Labs: Basic Metabolic Panel:  Recent Labs Lab 08/19/15 1838 08/20/15 1202 08/21/15 1632  NA 134* 134* 129*  K 3.9 4.0 4.6  CL 94*  --  91*  CO2 29  --  24  GLUCOSE 102* 78 104*  BUN 22*  --  44*  CREATININE 4.04*  --  6.23*  CALCIUM 9.3  --  8.5*  PHOS  --   --  4.7*   Liver Function Tests:  Recent Labs Lab 08/19/15 1838 08/21/15 1632  AST 15  --    ALT 16  --   ALKPHOS 117  --   BILITOT 0.8  --   PROT 7.7  --   ALBUMIN 3.6 3.3*   CBC:  Recent Labs Lab 08/19/15 1838 08/20/15 1202 08/21/15 1632  WBC 6.1  --  7.8  NEUTROABS 2.8  --   --   HGB 11.6* 14.3 9.9*  HCT 35.7* 42.0 31.1*  MCV 89.9  --  89.6  PLT 235  --  200   B CBG:  Recent Labs Lab 08/20/15 1111 08/20/15 1329 08/20/15 1418 08/20/15 1509 08/21/15 1534  GLUCAP 80 72 66 88 99  Medications: . sodium chloride 10 mL/hr at 08/20/15 1241  . sodium chloride 10 mL/hr at 08/20/15 1753   . amiodarone  200 mg Oral Daily  . aspirin  81 mg Oral Daily  . atorvastatin  10 mg Oral Daily  . calcium acetate  667 mg Oral TID WC  . clopidogrel  75 mg Oral Daily  . [START ON 08/23/2015] doxercalciferol  3 mcg Intravenous Q M,W,F-HD  . [START ON 08/25/2015] ferric gluconate (FERRLECIT/NULECIT) IV  62.5 mg Intravenous Q Wed-HD  . guaiFENesin  600 mg Oral  BID  . lactulose  20 g Oral Q12H  . metoprolol succinate  50 mg Oral Daily  . multivitamin  1 tablet Oral QHS  . risperiDONE  0.25 mg Oral QPM  . senna-docusate  1 tablet Oral BID

## 2015-08-22 NOTE — Progress Notes (Signed)
Isolation precautions due to negative PCR on 08/20/15.

## 2015-08-22 NOTE — Clinical Social Work Note (Signed)
Clinical Social Work Assessment  Patient Details  Name: [name] MRN: [id number] Date of Birth: 12/01/1956  Date of referral:  08/22/15               Reason for consult:  Discharge Planning                Permission sought to share information with:  Case Manager, Facility Sport and exercise psychologist, Family Supports Permission granted to share information::  Yes, Verbal Permission Granted  Name::      [name] [place] )  Agency::   ([place] )  Relationship::   (Son/ HCPOA)  Contact Information:   [phone])  Housing/Transportation Living arrangements for the past 2 months:  [place] of Information:  Adult Children Patient Interpreter Needed:  None Criminal Activity/Legal Involvement Pertinent to Current Situation/Hospitalization:  No - Comment as needed Significant Relationships:  Adult Children, Spouse, Other Family Members Lives with:  Facility Resident Do you feel safe going back to the place where you live?  Yes Need for family participation in patient care:  No (Coment)  Care giving concerns:  Patient to return back to [facility] once evaluated by psychiatry and medically cleared.    Social Worker assessment / plan:  Holiday representative spoke with patient's son/HCPOA, [name] in reference to post-acute planning. CSW introduced CSW role. Patient's son confirmed that patient is from [facility] and has been a [facility]-term resident for over a year. Pt's son stated he and family are agreeable to pt returning. Pt's son aware that patient will be evaluated by psych MD before returning to [facility]. Pt's son very involved in pt's care and aware of on-going medical procedures and continued dialysis. No further concerns reported by pt's son at this time. CSW to update FL-2 and fax to [facility]. CSW will continue to follow pt and pt's family for continued support and to facilitate pt's discharge needs once medically  stable.   Employment status:  Disabled (Comment on whether or not currently receiving Disability) Insurance information:  Medicare PT Recommendations:  [place] / Referral to community resources:  [place]  Patient/Family's Response to care:  Pt deaf and blind. Pt's son involved and supportive in pt's continued care. Pt's son pleasant and agreeable to pt returning to SNF, [facility]. Pt's son appreciated social work intervention.  Patient/Family's Understanding of and Emotional Response to Diagnosis, Current Treatment, and Prognosis: Pt's son understands pt will be evaluated by psych during hospitalization before returning to [facility]. Pt's son aware of surgical procedure and dialysis etc. CSW remains available.   Emotional Assessment Appearance:  Appears older than stated age Attitude/Demeanor/Rapport:  Unable to Assess Affect (typically observed):  Unable to Assess Orientation:  Fluctuating Orientation (Suspected and/or reported Sundowners) Alcohol / Substance use:  Not Applicable Psych involvement (Current and /or in the community):  Yes (Comment)  Discharge Needs  Concerns to be addressed:  Denies Needs/Concerns at this time Readmission within the last 30 days:  No Current discharge risk:  None Barriers to Discharge:  Continued Medical Work up   [employer], MSW, [place] [phone] 08/22/2015 12:07 PM

## 2015-08-22 NOTE — Progress Notes (Signed)
Dr. Marlou Sa aware of pt c/o chest pain, relieved after one Nitroglycerine.  Dr. Marlou Sa ordered telemetry.

## 2015-08-23 ENCOUNTER — Encounter (HOSPITAL_COMMUNITY): Payer: Self-pay | Admitting: Orthopedic Surgery

## 2015-08-23 LAB — CBC WITH DIFFERENTIAL/PLATELET
Basophils Absolute: 0.1 10*3/uL (ref 0.0–0.1)
Basophils Relative: 1 % (ref 0–1)
Eosinophils Absolute: 0.6 10*3/uL (ref 0.0–0.7)
Eosinophils Relative: 9 % — ABNORMAL HIGH (ref 0–5)
HCT: 29.3 % — ABNORMAL LOW (ref 36.0–46.0)
Hemoglobin: 9.6 g/dL — ABNORMAL LOW (ref 12.0–15.0)
Lymphocytes Relative: 19 % (ref 12–46)
Lymphs Abs: 1.1 10*3/uL (ref 0.7–4.0)
MCH: 29.7 pg (ref 26.0–34.0)
MCHC: 32.8 g/dL (ref 30.0–36.0)
MCV: 90.7 fL (ref 78.0–100.0)
Monocytes Absolute: 0.8 10*3/uL (ref 0.1–1.0)
Monocytes Relative: 14 % — ABNORMAL HIGH (ref 3–12)
Neutro Abs: 3.4 10*3/uL (ref 1.7–7.7)
Neutrophils Relative %: 57 % (ref 43–77)
Platelets: 206 10*3/uL (ref 150–400)
RBC: 3.23 MIL/uL — ABNORMAL LOW (ref 3.87–5.11)
RDW: 17.1 % — ABNORMAL HIGH (ref 11.5–15.5)
WBC: 6 10*3/uL (ref 4.0–10.5)

## 2015-08-23 LAB — RENAL FUNCTION PANEL
Albumin: 2.9 g/dL — ABNORMAL LOW (ref 3.5–5.0)
Anion gap: 9 (ref 5–15)
BUN: 45 mg/dL — ABNORMAL HIGH (ref 6–20)
CO2: 26 mmol/L (ref 22–32)
Calcium: 8.5 mg/dL — ABNORMAL LOW (ref 8.9–10.3)
Chloride: 96 mmol/L — ABNORMAL LOW (ref 101–111)
Creatinine, Ser: 6.04 mg/dL — ABNORMAL HIGH (ref 0.44–1.00)
GFR calc Af Amer: 8 mL/min — ABNORMAL LOW (ref >60)
GFR calc non Af Amer: 7 mL/min — ABNORMAL LOW (ref >60)
Glucose, Bld: 97 mg/dL (ref 65–99)
Phosphorus: 3.4 mg/dL (ref 2.5–4.6)
Potassium: 4.5 mmol/L (ref 3.5–5.1)
Sodium: 131 mmol/L — ABNORMAL LOW (ref 135–145)

## 2015-08-23 MED ORDER — OXYCODONE-ACETAMINOPHEN 5-325 MG PO TABS: 1.0000 | ORAL_TABLET | ORAL | Status: DC | PRN

## 2015-08-23 NOTE — Discharge Summary (Signed)
Physician Discharge Summary  Patient ID: [name] MRN: [id number] DOB/AGE: 1956/10/08 59 y.o.  Admit date: 08/19/2015 Discharge date: 08/23/2015  Admission Diagnoses: Osteomyelitis ulceration right forefoot  Discharge Diagnoses:  Principal Problem:   Psychosis, paranoid Active Problems:   Non compliance w medication regimen   Osteomyelitis of foot, acute   Discharged Condition: stable  Hospital Course: Patient's hospital course was essentially unremarkable. She underwent transmetatarsal amputation. Postoperatively patient was stable and was discharged back to skilled nursing  Consults: None  Significant Diagnostic Studies: labs: Routine labs  Treatments: surgery: See operative note  Discharge Exam: Blood pressure 147/63, pulse 75, temperature 97.9 F (36.6 C), temperature source Oral, resp. rate 16, height 5\' 7"  (1.702 m), weight 74.9 kg (165 lb 2 oz), SpO2 98 %. Incision/Wound: dressing clean and dry  Disposition: 03-Skilled Nursing Facility  Discharge Instructions    Call MD / Call 911    Complete by:  As directed   If you experience chest pain or shortness of breath, CALL 911 and be transported to the hospital emergency room.  If you develope a fever above 101 F, pus (white drainage) or increased drainage or redness at the wound, or calf pain, call your surgeon's office.     Constipation Prevention    Complete by:  As directed   Drink plenty of fluids.  Prune juice may be helpful.  You may use a stool softener, such as Colace (over the counter) 100 mg twice a day.  Use MiraLax (over the counter) for constipation as needed.     Diet - low sodium heart healthy    Complete by:  As directed      Increase activity slowly as tolerated    Complete by:  As directed      Non weight bearing    Complete by:  As directed   Laterality:  right  Extremity:  Lower            Medication List    TAKE these medications        acetaminophen 325 MG tablet  Commonly known  as:  TYLENOL  Take 650 mg by mouth 3 (three) times daily. Taking every day per [facility]     albuterol (2.5 MG/3ML) 0.083% nebulizer solution  Commonly known as:  PROVENTIL  Take 2.5 mg by nebulization every 2 (two) hours as needed for wheezing or shortness of breath.     amiodarone 200 MG tablet  Commonly known as:  PACERONE  Take 200 mg by mouth daily.     aspirin 81 MG tablet  Take 81 mg by mouth daily. For hypertension     atorvastatin 10 MG tablet  Commonly known as:  LIPITOR  Take 10 mg by mouth daily.     calcium acetate 667 MG capsule  Commonly known as:  PHOSLO  Take 1 capsule (667 mg total) by mouth 3 (three) times daily with meals.     clopidogrel 75 MG tablet  Commonly known as:  PLAVIX  Take 1 tablet (75 mg total) by mouth daily.     feeding supplement (NEPRO CARB STEADY) Liqd  Take 237 mLs by mouth as needed (missed meal during dialysis.).     guaiFENesin 600 MG 12 hr tablet  Commonly known as:  MUCINEX  Take 1 tablet (600 mg total) by mouth 2 (two) times daily.     HYDROcodone-acetaminophen 10-325 MG per tablet  Commonly known as:  NORCO  Take one tablet by mouth four times daily for  pain. Not to exceed 3gm APAP from all sources/24hr     lactulose 10 GM/15ML solution  Commonly known as:  CHRONULAC  Take 30 mLs (20 g total) by mouth 2 (two) times daily as needed for moderate constipation.     metoprolol succinate 50 MG 24 hr tablet  Commonly known as:  TOPROL-XL  Take 1 tablet (50 mg total) by mouth daily. Take with or immediately following a meal.     multivitamin with minerals Tabs tablet  Take 2 tablets by mouth daily.     nitroGLYCERIN 0.4 MG SL tablet  Commonly known as:  NITROSTAT  Place 1 tablet (0.4 mg total) under the tongue every 5 (five) minutes as needed for chest pain.     oxyCODONE-acetaminophen 5-325 MG per tablet  Commonly known as:  ROXICET  Take 1 tablet by mouth every 4 (four) hours as needed for severe pain.     risperiDONE 1 MG/ML  oral solution  Commonly known as:  RISPERDAL  Take 0.25 mg by mouth every evening.     senna-docusate 8.6-50 MG per tablet  Commonly known as:  Senokot-S  Take 1 tablet by mouth 2 (two) times daily.           Follow-up Information    Follow up with DUDA,MARCUS V, MD In 2 weeks.   Specialty:  Orthopedic Surgery   Contact information:   [place] [address] [phone]       Signed: Newt Minion 08/23/2015, 6:33 AM

## 2015-08-23 NOTE — Progress Notes (Signed)
Called report to [facility]. Gave report to RN

## 2015-08-23 NOTE — Discharge Planning (Signed)
Patient to be discharged back to [facility]. Patient's son, [name], updated regarding discharge.  Facility: [facility] RN report number: [phone] Transportation: EMS ([address]  Lubertha Sayres, [place] [phone]) and Surgical [phone])

## 2015-08-23 NOTE — Care Management Important Message (Signed)
Important Message  Patient Details  Name: [name] MRN: [id number] Date of Birth: 1956-03-08   Medicare Important Message Given:  Yes-second notification given    Delorse Lek 08/23/2015, 11:28 AM

## 2015-08-23 NOTE — Procedures (Signed)
I have seen and examined this patient and agree with the plan of care. No issues identified on dialysis [facility] W 08/23/2015, 10:09 AM

## 2015-08-23 NOTE — Progress Notes (Signed)
PT Cancellation Note  Patient Details Name: [name] MRN: [id number] DOB: 21-Mar-1956   Cancelled Treatment:    Reason Eval/Treat Not Completed: Patient at procedure or test/unavailable (Pt at dialysis per RN).  PT will continue to follow acutely.  Plan is for pt to d/c to SNF today.   Joslyn Hy PT, DPT [phone] Pager: [phone] 08/23/2015, 10:02 AM

## 2015-08-24 ENCOUNTER — Non-Acute Institutional Stay (SKILLED_NURSING_FACILITY): Payer: Medicare Other | Admitting: Adult Health

## 2015-08-24 ENCOUNTER — Encounter: Payer: Self-pay | Admitting: Adult Health

## 2015-08-24 ENCOUNTER — Other Ambulatory Visit: Payer: Self-pay

## 2015-08-24 DIAGNOSIS — E1322 Other specified diabetes mellitus with diabetic chronic kidney disease: Secondary | ICD-10-CM

## 2015-08-24 DIAGNOSIS — N189 Chronic kidney disease, unspecified: Secondary | ICD-10-CM | POA: Diagnosis not present

## 2015-08-24 DIAGNOSIS — I509 Heart failure, unspecified: Secondary | ICD-10-CM | POA: Diagnosis not present

## 2015-08-24 DIAGNOSIS — I472 Ventricular tachycardia, unspecified: Secondary | ICD-10-CM

## 2015-08-24 DIAGNOSIS — E119 Type 2 diabetes mellitus without complications: Secondary | ICD-10-CM | POA: Diagnosis not present

## 2015-08-24 DIAGNOSIS — F22 Delusional disorders: Secondary | ICD-10-CM

## 2015-08-24 DIAGNOSIS — K5909 Other constipation: Secondary | ICD-10-CM | POA: Diagnosis not present

## 2015-08-24 DIAGNOSIS — F29 Unspecified psychosis not due to a substance or known physiological condition: Secondary | ICD-10-CM | POA: Diagnosis not present

## 2015-08-24 DIAGNOSIS — I5032 Chronic diastolic (congestive) heart failure: Secondary | ICD-10-CM | POA: Diagnosis not present

## 2015-08-24 DIAGNOSIS — I255 Ischemic cardiomyopathy: Secondary | ICD-10-CM

## 2015-08-24 DIAGNOSIS — N186 End stage renal disease: Secondary | ICD-10-CM

## 2015-08-24 DIAGNOSIS — M6281 Muscle weakness (generalized): Secondary | ICD-10-CM | POA: Diagnosis not present

## 2015-08-24 DIAGNOSIS — I739 Peripheral vascular disease, unspecified: Secondary | ICD-10-CM | POA: Diagnosis not present

## 2015-08-24 DIAGNOSIS — F4320 Adjustment disorder, unspecified: Secondary | ICD-10-CM | POA: Diagnosis not present

## 2015-08-24 DIAGNOSIS — F419 Anxiety disorder, unspecified: Secondary | ICD-10-CM | POA: Diagnosis not present

## 2015-08-24 DIAGNOSIS — E875 Hyperkalemia: Secondary | ICD-10-CM | POA: Diagnosis not present

## 2015-08-24 DIAGNOSIS — J410 Simple chronic bronchitis: Secondary | ICD-10-CM

## 2015-08-24 DIAGNOSIS — J200 Acute bronchitis due to Mycoplasma pneumoniae: Secondary | ICD-10-CM | POA: Diagnosis not present

## 2015-08-24 DIAGNOSIS — J42 Unspecified chronic bronchitis: Secondary | ICD-10-CM | POA: Insufficient documentation

## 2015-08-24 DIAGNOSIS — L97411 Non-pressure chronic ulcer of right heel and midfoot limited to breakdown of skin: Secondary | ICD-10-CM | POA: Diagnosis not present

## 2015-08-24 DIAGNOSIS — I10 Essential (primary) hypertension: Secondary | ICD-10-CM | POA: Diagnosis not present

## 2015-08-24 DIAGNOSIS — H540 Blindness, both eyes: Secondary | ICD-10-CM | POA: Diagnosis not present

## 2015-08-24 DIAGNOSIS — E1122 Type 2 diabetes mellitus with diabetic chronic kidney disease: Secondary | ICD-10-CM

## 2015-08-24 DIAGNOSIS — E785 Hyperlipidemia, unspecified: Secondary | ICD-10-CM | POA: Diagnosis not present

## 2015-08-24 DIAGNOSIS — H913 Deaf nonspeaking, not elsewhere classified: Secondary | ICD-10-CM | POA: Diagnosis not present

## 2015-08-24 MED ORDER — RISPERIDONE 1 MG/ML PO SOLN: 0.2500 mg | Freq: Two times a day (BID) | ORAL | Status: DC

## 2015-08-24 MED ORDER — OXYCODONE-ACETAMINOPHEN 5-325 MG PO TABS: 1.0000 | ORAL_TABLET | ORAL | Status: DC | PRN

## 2015-08-24 NOTE — Progress Notes (Signed)
Patient ID: [name], female   DOB: August 10, 1956, 59 y.o.   MRN: [id number]   Facility: [facility]      No Known Allergies  Chief Complaint  Patient presents with  . Hospitalization Follow-up    HPI:  She is a [facility] term resident of this facility who was hospitalized for a right transmetatarsal amputation. She has had a left done in the past. She is wanting her a/v shunt out of her body; per the nursing staff she feels as though the shunt is not a natural part of her body. She is unable to fully participate in the hpi or ros due to her communication barriers.    Past Medical History  Diagnosis Date  . Hyperlipidemia   . Hypertension     a. Has previously refused blood pressure meds.   . Deaf     USE SIGN [employer]PRE[employer].  Divorced from husband but lives with him. Has daughter but she does not care for her.  . Bilateral leg edema     a. Chronic.  Marland Kitchen Psychiatric disorder     She frequently exhibits paranoia and has been diagnosed with psychotic d/o NOS during hospital stay in the past. She is tangetial and perseverative during her visits. She apparently has had a bad experience with mental health in [place] in the past and refuses to discuss mental issues for fear that she will be sent back there. Her paranoia, communication issues, financial woes and lack of fam  . Fibroid uterus   . Anemia     Due to fibroids.  . Heart murmur   . Diabetes mellitus     a. Per PCP note 2012 (A1C 9.2) - pt unwilling to take meds and was educated on risk of uncontrolled DM.  b. A1C 5.9 in 11/2013.  Marland Kitchen Coronary artery disease   . Blind   . ESRD on hemodialysis     Mon, Wed, Fri  . CHF (congestive heart failure)   . MI (myocardial infarction) 11/2014  . Essential hypertension 08/09/2009    Qualifier: Diagnosis of  By: Sarita Haver  MD, Coralyn Mark    . Osteomyelitis     Past Surgical History  Procedure Laterality Date  . No past surgeries    . Insertion of dialysis catheter Right 01/02/2014   Procedure: INSERTION OF DIALYSIS CATHETER;  Surgeon: Rosetta Posner, MD;  Location: [facility];  Service: Vascular;  Laterality: Right;  . Av fistula placement Left 01/02/2014    Procedure: ARTERIOVENOUS (AV) FISTULA CREATION;  Surgeon: Rosetta Posner, MD;  Location: [facility];  Service: Vascular;  Laterality: Left;  . Amputation Bilateral 04/02/2015    Procedure: Amputation Bilateral Great Toes MTP Joint;  Surgeon: Newt Minion, MD;  Location: [facility];  Service: Orthopedics;  Laterality: Bilateral;  . Amputation Left 05/14/2015    Procedure: Left Transmetatarsal Amputation;  Surgeon: Newt Minion, MD;  Location: [facility];  Service: Orthopedics;  Laterality: Left;  . Orif patella Left 05/17/2015    Procedure: OPEN REDUCTION INTERNAL (ORIF) FIXATION PATELLA;  Surgeon: Newt Minion, MD;  Location: [facility];  Service: Orthopedics;  Laterality: Left;  . Amputation Right 08/20/2015    Procedure: Transmetatarsal Amputation Right Foot;  Surgeon: Newt Minion, MD;  Location: [facility];  Service: Orthopedics;  Laterality: Right;    VITAL SIGNS BP 100/60 mmHg  Pulse 96  Ht _0  (1.727 m)  Wt 177 lb (80.287 kg)  BMI 26.92 kg/m2  SpO2 99%  Patient's  Medications  New Prescriptions   No medications on file  Previous Medications   ACETAMINOPHEN (TYLENOL) 325 MG TABLET    Take 650 mg by mouth 3 (three) times daily. Taking every day per MAR   ALBUTEROL (PROVENTIL) (2.5 MG/3ML) 0.083% NEBULIZER SOLUTION    Take 2.5 mg by nebulization every 2 (two) hours as needed for wheezing or shortness of breath.   AMINO ACIDS-PROTEIN HYDROLYS (FEEDING SUPPLEMENT, PRO-STAT SUGAR FREE 64,) LIQD    Take 30 mLs by mouth 2 (two) times daily.   AMIODARONE (PACERONE) 200 MG TABLET    Take 200 mg by mouth daily.   ASPIRIN 81 MG TABLET    Take 81 mg by mouth daily. For hypertension   ATORVASTATIN (LIPITOR) 10 MG TABLET    Take 10 mg by mouth daily.   CALCIUM ACETATE (PHOSLO) 667 MG CAPSULE    Take 1 capsule (667 mg total) by mouth 3 (three) times  daily with meals.   CLOPIDOGREL (PLAVIX) 75 MG TABLET    Take 1 tablet (75 mg total) by mouth daily.   GUAIFENESIN (MUCINEX) 600 MG 12 HR TABLET    Take 1 tablet (600 mg total) by mouth 2 (two) times daily.   HYDROCODONE-ACETAMINOPHEN (NORCO) 10-325 MG PER TABLET    Take one tablet by mouth four times daily for pain. Not to exceed 3gm APAP from all sources/24hr   LACTULOSE (CHRONULAC) 10 GM/15ML SOLUTION    Take 30 mLs (20 g total) by mouth 2 (two) times daily as needed for moderate constipation.   METOPROLOL SUCCINATE (TOPROL-XL) 50 MG 24 HR TABLET    Take 1 tablet (50 mg total) by mouth daily. Take with or immediately following a meal.   MULTIPLE VITAMIN (MULTIVITAMIN WITH MINERALS) TABS TABLET    Take 2 tablets by mouth daily.   NITROGLYCERIN (NITROSTAT) 0.4 MG SL TABLET    Place 1 tablet (0.4 mg total) under the tongue every 5 (five) minutes as needed for chest pain.   NUTRITIONAL SUPPLEMENT LIQD    Take 60 mLs by mouth 2 (two) times daily. 2-CAL   OXYCODONE-ACETAMINOPHEN (ROXICET) 5-325 MG PER TABLET    Take 1 tablet by mouth every 4 (four) hours as needed for severe pain.   RISPERIDONE (RISPERDAL) 1 MG/ML ORAL SOLUTION    Take 0.25 mg by mouth every evening.   SENNA-DOCUSATE (SENOKOT-S) 8.6-50 MG PER TABLET    Take 1 tablet by mouth 2 (two) times daily.  Modified Medications   No medications on file  Discontinued Medications   NUTRITIONAL SUPPLEMENTS (FEEDING SUPPLEMENT, NEPRO CARB STEADY,) LIQD    Take 237 mLs by mouth as needed (missed meal during dialysis.).     SIGNIFICANT DIAGNOSTIC EXAMS   11-24-14: Myoview: 1. Fixed defects involving the apex, anterior lateral wall and the inferior wall. The apex and anterior lateral wall fixed defects aresuggestive for an infarct. No evidence for reversibility or ischemia. 2. Diffuse hypokinesia and minimal wall motion along the lateral wall. 3. Left ventricular ejection fraction is 35%. 4. High-risk stress test findings*.  04-16-15: 2-d echo:  - Left ventricle: The cavity size was mildly dilated. Wall thickness was increased in a pattern of mild LVH. Systolic function was moderately to severely reduced. The estimated ejection fraction was in the range of 30% to 35%. There is akinesis of the anteroseptal and apical myocardium. Features are consistent with a pseudonormal left ventricular filling pattern, with concomitant abnormal relaxation and increased filling pressure (grade 2 diastolic dysfunction). Doppler parameters are consistent  with high ventricular filling pressure. - Mitral valve: Calcified annulus. There was mild regurgitation. - Left atrium: The atrium was mildly dilated. - Pulmonary arteries: Systolic pressure was mildly increased.   04-20-15: chest x-ray: Mild congestive heart failure suspected. Small focal opacity right upper lung field favored to be due to atelectasis or early airspace disease secondary to edema. Suggest attention on follow-up.  05-16-15: left knee x-ray: Comminuted fracture involving the lower pole of the patella, with approximately 3 cm of separation at the fracture, and diffuse overlying soft tissue swelling. Associated moderate to large knee joint effusion noted.  05-16-15: left hip and pelvis x-ray: 1. No evidence of fracture or dislocation. 2. Calcified fibroids noted.      LABS REVIEWED:   11-19-14: wbc 10.1; hgb .6; hct 29.1; mcv 93.6; plt 272; glucose 260; bun 83; creat 6.65; k+6.7; na++128; alk phos 142; ast 60; lat 68; t pro 8.6; albumin 3.5 11-21-14: wbc 8.2; hgb 9.0; hct 27.7; mcv 97.2; plt 251; glucose 166; bun 45; creat 4.77; k+5.0; na++134; phos 5.2; albumin 3.2; hgb a1c 6.0 11-24-14: wbc 7.2; hgb 8.6; hct 27.2; mcv 96.5; plt 233; glucose 99; bun 40; creat 4.51; k+4.4; na++132; phos 6.0; albumin 3.0 01-08-15: hgb a1c 5.2  02-13-15; chol 165; ldl 106; trig 46; hdl 50 04-19-15: wbc 9.5; hgb 10.3; hct 31.9; mcv 94.4; plt 400 glucose 108; bun 22; creat 3.75; k+3.6; na++135; ast 38; alt 52; alk  phos 183; albumin 3.1; tsh 1.839; hgb a1c 5.4 05-18-15: wbc 9.0; hgb 11.3; hct 36.0; mcv 95.0; plt 485; glucose 91; bun 34; creat 5.34; k+5.1; na++134 July 2016: hgb a1c 5.5 08-21-15: wbc 7.8; hgb 9.9; hct 31.;1 mcv 89.6; plt 200; glucose 104; bun 44; creat 6.23; k+4.6; na++129; phos 4.7; albumin 3.3 08-23-15: wbc 6.0; hgb 9.6; hct 29.3; mcv 90.7; plt 206; glucose 97; bun 45; creat 6.04; k+4.5; na++131; phos 3.4; albumin 2.9       Review of Systems Unable to perform ROS: Other    Physical Exam Constitutional: She appears well-developed and well-nourished. No distress.  Eyes: Conjunctivae are normal.  Neck: Neck supple. No JVD present. No thyromegaly present.  Cardiovascular: Normal rate, regular rhythm and intact distal pulses.   Respiratory: Effort normal and breath sounds normal. No respiratory distress. She has no wheezes.  GI: Soft. Bowel sounds are normal. She exhibits no distension. There is no tenderness.  Musculoskeletal: She exhibits no edema.  Able to move all extremities  Has splint on left leg Is status post left transmetatarsal amputation Has right transmetatarsal amputation; without signs of infection present  Lymphadenopathy:    She has no cervical adenopathy.  Neurological: She is alert.  Skin: Skin is warm and dry. She is not diaphoretic.  Left upper a/v fistula: +thril +bruit     ASSESSMENT/ PLAN:  1. ESRD on hemodialysis: is followed by nephrology is on hemodialysis three days per week.  Will continue phoslo 667  three times daily  is on 1200 cc fluid restriction will monitor  2. Dyslipidemia: will continue lipitor 10 mg daily ldl is 106  3. VT sustained: she is not a candidate for icd. Her heart rate is stable will continue amiodarone 200 mg daily and will monitor her status.   4. Diastolic heart failure with EF 30-35%: will continue toprol xl 50 mg daily has prn ntg  will continue to monitor her status.   5. PAD: is status post amputations:she has had  recent right metatarsal amputation; there are no signs of  infection present;  will continue asa 81 mg daily and plavix 75 mg daily and will monitor   6. Diabetes: her hgb a1c is 5.5 is presently not on medications; will not make changes will monitor  7. Hypertension: will continue toprol xl 50 mg daily   8. Constipation: will continue lactulose 30 cc 2 times daily as needed and senna s 2 tabs daily   9. Bronchitis; chronic: will continue mucinex twice daily and albuterol neb treatment every 2 hours as needed   10. FTT; will continue supplements as directed; her current weight is 177 pounds; her albumin is 2.9  will monitor   11. Psychosis: she believes that her a/v shunt is an unnatural part of her body and is wanting it out. She feels as though someone planted it her per the nursing staff. Due to her continued distress about this will increase her risperdal to 0.25 twice daily and will monitor her status.   Will check lipids; liver function and hgb a1c next draw.     Ok Edwards NP [facility]  Contact [phone] Monday through Friday 8am- 5pm  After hours call [phone]

## 2015-08-25 DIAGNOSIS — J200 Acute bronchitis due to Mycoplasma pneumoniae: Secondary | ICD-10-CM | POA: Diagnosis not present

## 2015-08-25 DIAGNOSIS — E1129 Type 2 diabetes mellitus with other diabetic kidney complication: Secondary | ICD-10-CM | POA: Diagnosis not present

## 2015-08-25 DIAGNOSIS — N186 End stage renal disease: Secondary | ICD-10-CM | POA: Diagnosis not present

## 2015-08-25 DIAGNOSIS — E875 Hyperkalemia: Secondary | ICD-10-CM | POA: Diagnosis not present

## 2015-08-25 DIAGNOSIS — E785 Hyperlipidemia, unspecified: Secondary | ICD-10-CM | POA: Diagnosis not present

## 2015-08-25 DIAGNOSIS — H540 Blindness, both eyes: Secondary | ICD-10-CM | POA: Diagnosis not present

## 2015-08-25 DIAGNOSIS — E119 Type 2 diabetes mellitus without complications: Secondary | ICD-10-CM | POA: Diagnosis not present

## 2015-08-25 DIAGNOSIS — Z992 Dependence on renal dialysis: Secondary | ICD-10-CM | POA: Diagnosis not present

## 2015-08-25 DIAGNOSIS — I509 Heart failure, unspecified: Secondary | ICD-10-CM | POA: Diagnosis not present

## 2015-08-26 DIAGNOSIS — E875 Hyperkalemia: Secondary | ICD-10-CM | POA: Diagnosis not present

## 2015-08-26 DIAGNOSIS — N189 Chronic kidney disease, unspecified: Secondary | ICD-10-CM | POA: Diagnosis not present

## 2015-08-26 DIAGNOSIS — H913 Deaf nonspeaking, not elsewhere classified: Secondary | ICD-10-CM | POA: Diagnosis not present

## 2015-08-26 DIAGNOSIS — E119 Type 2 diabetes mellitus without complications: Secondary | ICD-10-CM | POA: Diagnosis not present

## 2015-08-26 DIAGNOSIS — F419 Anxiety disorder, unspecified: Secondary | ICD-10-CM | POA: Diagnosis not present

## 2015-08-26 DIAGNOSIS — I509 Heart failure, unspecified: Secondary | ICD-10-CM | POA: Diagnosis not present

## 2015-08-26 DIAGNOSIS — I10 Essential (primary) hypertension: Secondary | ICD-10-CM | POA: Diagnosis not present

## 2015-08-26 DIAGNOSIS — H540 Blindness, both eyes: Secondary | ICD-10-CM | POA: Diagnosis not present

## 2015-08-26 DIAGNOSIS — J200 Acute bronchitis due to Mycoplasma pneumoniae: Secondary | ICD-10-CM | POA: Diagnosis not present

## 2015-08-26 DIAGNOSIS — L97411 Non-pressure chronic ulcer of right heel and midfoot limited to breakdown of skin: Secondary | ICD-10-CM | POA: Diagnosis not present

## 2015-08-26 DIAGNOSIS — E785 Hyperlipidemia, unspecified: Secondary | ICD-10-CM | POA: Diagnosis not present

## 2015-08-26 DIAGNOSIS — F29 Unspecified psychosis not due to a substance or known physiological condition: Secondary | ICD-10-CM | POA: Diagnosis not present

## 2015-08-26 DIAGNOSIS — F4320 Adjustment disorder, unspecified: Secondary | ICD-10-CM | POA: Diagnosis not present

## 2015-08-26 DIAGNOSIS — M6281 Muscle weakness (generalized): Secondary | ICD-10-CM | POA: Diagnosis not present

## 2015-08-27 DIAGNOSIS — E1129 Type 2 diabetes mellitus with other diabetic kidney complication: Secondary | ICD-10-CM | POA: Diagnosis not present

## 2015-08-27 DIAGNOSIS — D509 Iron deficiency anemia, unspecified: Secondary | ICD-10-CM | POA: Diagnosis not present

## 2015-08-27 DIAGNOSIS — D631 Anemia in chronic kidney disease: Secondary | ICD-10-CM | POA: Diagnosis not present

## 2015-08-27 DIAGNOSIS — J200 Acute bronchitis due to Mycoplasma pneumoniae: Secondary | ICD-10-CM | POA: Diagnosis not present

## 2015-08-27 DIAGNOSIS — N2581 Secondary hyperparathyroidism of renal origin: Secondary | ICD-10-CM | POA: Diagnosis not present

## 2015-08-27 DIAGNOSIS — H540 Blindness, both eyes: Secondary | ICD-10-CM | POA: Diagnosis not present

## 2015-08-27 DIAGNOSIS — Z23 Encounter for immunization: Secondary | ICD-10-CM | POA: Diagnosis not present

## 2015-08-27 DIAGNOSIS — I509 Heart failure, unspecified: Secondary | ICD-10-CM | POA: Diagnosis not present

## 2015-08-27 DIAGNOSIS — E119 Type 2 diabetes mellitus without complications: Secondary | ICD-10-CM | POA: Diagnosis not present

## 2015-08-27 DIAGNOSIS — E785 Hyperlipidemia, unspecified: Secondary | ICD-10-CM | POA: Diagnosis not present

## 2015-08-27 DIAGNOSIS — N186 End stage renal disease: Secondary | ICD-10-CM | POA: Diagnosis not present

## 2015-08-27 DIAGNOSIS — E875 Hyperkalemia: Secondary | ICD-10-CM | POA: Diagnosis not present

## 2015-08-30 DIAGNOSIS — H540 Blindness, both eyes: Secondary | ICD-10-CM | POA: Diagnosis not present

## 2015-08-30 DIAGNOSIS — D509 Iron deficiency anemia, unspecified: Secondary | ICD-10-CM | POA: Diagnosis not present

## 2015-08-30 DIAGNOSIS — J200 Acute bronchitis due to Mycoplasma pneumoniae: Secondary | ICD-10-CM | POA: Diagnosis not present

## 2015-08-30 DIAGNOSIS — E875 Hyperkalemia: Secondary | ICD-10-CM | POA: Diagnosis not present

## 2015-08-30 DIAGNOSIS — Z23 Encounter for immunization: Secondary | ICD-10-CM | POA: Diagnosis not present

## 2015-08-30 DIAGNOSIS — N2581 Secondary hyperparathyroidism of renal origin: Secondary | ICD-10-CM | POA: Diagnosis not present

## 2015-08-30 DIAGNOSIS — E1129 Type 2 diabetes mellitus with other diabetic kidney complication: Secondary | ICD-10-CM | POA: Diagnosis not present

## 2015-08-30 DIAGNOSIS — I509 Heart failure, unspecified: Secondary | ICD-10-CM | POA: Diagnosis not present

## 2015-08-30 DIAGNOSIS — D631 Anemia in chronic kidney disease: Secondary | ICD-10-CM | POA: Diagnosis not present

## 2015-08-30 DIAGNOSIS — E785 Hyperlipidemia, unspecified: Secondary | ICD-10-CM | POA: Diagnosis not present

## 2015-08-30 DIAGNOSIS — E119 Type 2 diabetes mellitus without complications: Secondary | ICD-10-CM | POA: Diagnosis not present

## 2015-08-30 DIAGNOSIS — N186 End stage renal disease: Secondary | ICD-10-CM | POA: Diagnosis not present

## 2015-08-31 DIAGNOSIS — H540 Blindness, both eyes: Secondary | ICD-10-CM | POA: Diagnosis not present

## 2015-08-31 DIAGNOSIS — E785 Hyperlipidemia, unspecified: Secondary | ICD-10-CM | POA: Diagnosis not present

## 2015-08-31 DIAGNOSIS — E119 Type 2 diabetes mellitus without complications: Secondary | ICD-10-CM | POA: Diagnosis not present

## 2015-08-31 DIAGNOSIS — J200 Acute bronchitis due to Mycoplasma pneumoniae: Secondary | ICD-10-CM | POA: Diagnosis not present

## 2015-08-31 DIAGNOSIS — I509 Heart failure, unspecified: Secondary | ICD-10-CM | POA: Diagnosis not present

## 2015-08-31 DIAGNOSIS — E875 Hyperkalemia: Secondary | ICD-10-CM | POA: Diagnosis not present

## 2015-09-01 DIAGNOSIS — Z23 Encounter for immunization: Secondary | ICD-10-CM | POA: Diagnosis not present

## 2015-09-01 DIAGNOSIS — D631 Anemia in chronic kidney disease: Secondary | ICD-10-CM | POA: Diagnosis not present

## 2015-09-01 DIAGNOSIS — D509 Iron deficiency anemia, unspecified: Secondary | ICD-10-CM | POA: Diagnosis not present

## 2015-09-01 DIAGNOSIS — E1129 Type 2 diabetes mellitus with other diabetic kidney complication: Secondary | ICD-10-CM | POA: Diagnosis not present

## 2015-09-01 DIAGNOSIS — N186 End stage renal disease: Secondary | ICD-10-CM | POA: Diagnosis not present

## 2015-09-01 DIAGNOSIS — N2581 Secondary hyperparathyroidism of renal origin: Secondary | ICD-10-CM | POA: Diagnosis not present

## 2015-09-02 ENCOUNTER — Non-Acute Institutional Stay (SKILLED_NURSING_FACILITY): Payer: Medicare Other | Admitting: Internal Medicine

## 2015-09-02 DIAGNOSIS — I739 Peripheral vascular disease, unspecified: Secondary | ICD-10-CM | POA: Diagnosis not present

## 2015-09-02 DIAGNOSIS — Z7382 Dual sensory impairment: Secondary | ICD-10-CM | POA: Diagnosis not present

## 2015-09-02 DIAGNOSIS — I10 Essential (primary) hypertension: Secondary | ICD-10-CM | POA: Diagnosis not present

## 2015-09-02 DIAGNOSIS — I5032 Chronic diastolic (congestive) heart failure: Secondary | ICD-10-CM

## 2015-09-02 DIAGNOSIS — Z992 Dependence on renal dialysis: Secondary | ICD-10-CM

## 2015-09-02 DIAGNOSIS — E785 Hyperlipidemia, unspecified: Secondary | ICD-10-CM

## 2015-09-02 DIAGNOSIS — J200 Acute bronchitis due to Mycoplasma pneumoniae: Secondary | ICD-10-CM | POA: Diagnosis not present

## 2015-09-02 DIAGNOSIS — K5909 Other constipation: Secondary | ICD-10-CM | POA: Diagnosis not present

## 2015-09-02 DIAGNOSIS — N186 End stage renal disease: Secondary | ICD-10-CM

## 2015-09-02 DIAGNOSIS — Z89431 Acquired absence of right foot: Secondary | ICD-10-CM

## 2015-09-02 DIAGNOSIS — H540 Blindness, both eyes: Secondary | ICD-10-CM | POA: Diagnosis not present

## 2015-09-02 DIAGNOSIS — E119 Type 2 diabetes mellitus without complications: Secondary | ICD-10-CM | POA: Diagnosis not present

## 2015-09-02 DIAGNOSIS — I472 Ventricular tachycardia, unspecified: Secondary | ICD-10-CM

## 2015-09-02 DIAGNOSIS — I509 Heart failure, unspecified: Secondary | ICD-10-CM | POA: Diagnosis not present

## 2015-09-02 DIAGNOSIS — E875 Hyperkalemia: Secondary | ICD-10-CM | POA: Diagnosis not present

## 2015-09-02 DIAGNOSIS — F22 Delusional disorders: Secondary | ICD-10-CM

## 2015-09-02 DIAGNOSIS — H9190 Unspecified hearing loss, unspecified ear: Secondary | ICD-10-CM

## 2015-09-02 DIAGNOSIS — H547 Unspecified visual loss: Secondary | ICD-10-CM

## 2015-09-03 DIAGNOSIS — H540 Blindness, both eyes: Secondary | ICD-10-CM | POA: Diagnosis not present

## 2015-09-03 DIAGNOSIS — D509 Iron deficiency anemia, unspecified: Secondary | ICD-10-CM | POA: Diagnosis not present

## 2015-09-03 DIAGNOSIS — E785 Hyperlipidemia, unspecified: Secondary | ICD-10-CM | POA: Diagnosis not present

## 2015-09-03 DIAGNOSIS — J200 Acute bronchitis due to Mycoplasma pneumoniae: Secondary | ICD-10-CM | POA: Diagnosis not present

## 2015-09-03 DIAGNOSIS — N186 End stage renal disease: Secondary | ICD-10-CM | POA: Diagnosis not present

## 2015-09-03 DIAGNOSIS — Z23 Encounter for immunization: Secondary | ICD-10-CM | POA: Diagnosis not present

## 2015-09-03 DIAGNOSIS — E1129 Type 2 diabetes mellitus with other diabetic kidney complication: Secondary | ICD-10-CM | POA: Diagnosis not present

## 2015-09-03 DIAGNOSIS — E875 Hyperkalemia: Secondary | ICD-10-CM | POA: Diagnosis not present

## 2015-09-03 DIAGNOSIS — D631 Anemia in chronic kidney disease: Secondary | ICD-10-CM | POA: Diagnosis not present

## 2015-09-03 DIAGNOSIS — N2581 Secondary hyperparathyroidism of renal origin: Secondary | ICD-10-CM | POA: Diagnosis not present

## 2015-09-03 DIAGNOSIS — I509 Heart failure, unspecified: Secondary | ICD-10-CM | POA: Diagnosis not present

## 2015-09-03 DIAGNOSIS — E119 Type 2 diabetes mellitus without complications: Secondary | ICD-10-CM | POA: Diagnosis not present

## 2015-09-04 DIAGNOSIS — E119 Type 2 diabetes mellitus without complications: Secondary | ICD-10-CM | POA: Diagnosis not present

## 2015-09-04 DIAGNOSIS — I509 Heart failure, unspecified: Secondary | ICD-10-CM | POA: Diagnosis not present

## 2015-09-04 DIAGNOSIS — E875 Hyperkalemia: Secondary | ICD-10-CM | POA: Diagnosis not present

## 2015-09-04 DIAGNOSIS — E785 Hyperlipidemia, unspecified: Secondary | ICD-10-CM | POA: Diagnosis not present

## 2015-09-04 DIAGNOSIS — J200 Acute bronchitis due to Mycoplasma pneumoniae: Secondary | ICD-10-CM | POA: Diagnosis not present

## 2015-09-04 DIAGNOSIS — H540 Blindness, both eyes: Secondary | ICD-10-CM | POA: Diagnosis not present

## 2015-09-06 DIAGNOSIS — H540 Blindness, both eyes: Secondary | ICD-10-CM | POA: Diagnosis not present

## 2015-09-06 DIAGNOSIS — E119 Type 2 diabetes mellitus without complications: Secondary | ICD-10-CM | POA: Diagnosis not present

## 2015-09-06 DIAGNOSIS — E875 Hyperkalemia: Secondary | ICD-10-CM | POA: Diagnosis not present

## 2015-09-06 DIAGNOSIS — I509 Heart failure, unspecified: Secondary | ICD-10-CM | POA: Diagnosis not present

## 2015-09-06 DIAGNOSIS — E785 Hyperlipidemia, unspecified: Secondary | ICD-10-CM | POA: Diagnosis not present

## 2015-09-06 DIAGNOSIS — Z23 Encounter for immunization: Secondary | ICD-10-CM | POA: Diagnosis not present

## 2015-09-06 DIAGNOSIS — J200 Acute bronchitis due to Mycoplasma pneumoniae: Secondary | ICD-10-CM | POA: Diagnosis not present

## 2015-09-06 DIAGNOSIS — N186 End stage renal disease: Secondary | ICD-10-CM | POA: Diagnosis not present

## 2015-09-06 DIAGNOSIS — E1129 Type 2 diabetes mellitus with other diabetic kidney complication: Secondary | ICD-10-CM | POA: Diagnosis not present

## 2015-09-06 DIAGNOSIS — N2581 Secondary hyperparathyroidism of renal origin: Secondary | ICD-10-CM | POA: Diagnosis not present

## 2015-09-06 DIAGNOSIS — D509 Iron deficiency anemia, unspecified: Secondary | ICD-10-CM | POA: Diagnosis not present

## 2015-09-06 DIAGNOSIS — D631 Anemia in chronic kidney disease: Secondary | ICD-10-CM | POA: Diagnosis not present

## 2015-09-07 ENCOUNTER — Encounter: Payer: Self-pay | Admitting: Internal Medicine

## 2015-09-07 DIAGNOSIS — E785 Hyperlipidemia, unspecified: Secondary | ICD-10-CM | POA: Diagnosis not present

## 2015-09-07 DIAGNOSIS — J200 Acute bronchitis due to Mycoplasma pneumoniae: Secondary | ICD-10-CM | POA: Diagnosis not present

## 2015-09-07 DIAGNOSIS — H540 Blindness, both eyes: Secondary | ICD-10-CM | POA: Diagnosis not present

## 2015-09-07 DIAGNOSIS — E119 Type 2 diabetes mellitus without complications: Secondary | ICD-10-CM | POA: Diagnosis not present

## 2015-09-07 DIAGNOSIS — I509 Heart failure, unspecified: Secondary | ICD-10-CM | POA: Diagnosis not present

## 2015-09-07 DIAGNOSIS — E875 Hyperkalemia: Secondary | ICD-10-CM | POA: Diagnosis not present

## 2015-09-07 NOTE — Progress Notes (Signed)
Patient ID: [name], female   DOB: [birth year], 59 y.o.   MRN: [id number]    HISTORY AND PHYSICAL   DATE: 09/02/15  Location:  [facility] of Service: SNF [phone])   Extended Emergency Contact Information Primary Emergency Contact: Morissette,[name] Address: [place], [place] [address] [place] Phone: [phone] Relation: Spouse Secondary Emergency Contact: [name] of [place] Phone: [phone] Mobile Phone: [phone] Relation: Sister  Advanced Directive information  FULL CODE  Chief Complaint  Patient presents with  . Readmit To SNF    HPI:  59 yo female seen today as a readmission into SNF following hospital stay for acute psychosis, right TMA, ESRD/HD. 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 She c/o pain in foot. She takes percocet prn. 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 Hx obtained from chart and nursing. No nursing issues. Appetite is ok and she sleeps well. She is tolerating PT. No falls  Chronic diastolic heart failure/hx sustained Vtach/HTN - stable. Rate controlled on amio and BB. She takes ASA and plavix. Cholesterol stable on statin.  Psychosis - no changes made to psych regimen. She is on risperdal  She takes vitamins/minerals and nutritional supplements. Constipation controlled with bowel regimen to include lactulose  Past Medical History  Diagnosis Date  . Hyperlipidemia   . Hypertension     a. Has previously refused blood pressure meds.   . Deaf     USE SIGN INTERPRETER.  Divorced from husband but lives with him. Has daughter but she does not care for her.  . Bilateral leg edema     a. Chronic.  Marland Kitchen Psychiatric disorder     She frequently exhibits paranoia and has been diagnosed with psychotic d/o NOS during hospital stay in the past. She is tangetial and perseverative during her visits. She  apparently has had a bad experience with mental health in [place] in the past and refuses to discuss mental issues for fear that she will be sent back there. Her paranoia, communication issues, financial woes and lack of fam  . Fibroid uterus   . Anemia     Due to fibroids.  . Heart murmur   . Diabetes mellitus     a. Per PCP note 2012 (A1C 9.2) - pt unwilling to take meds and was educated on risk of uncontrolled DM.  b. A1C 5.9 in 11/2013.  Marland Kitchen Coronary artery disease   . Blind   . ESRD on hemodialysis     Mon, Wed, Fri  . CHF (congestive heart failure)   . MI (myocardial infarction) 11/2014  . Essential hypertension 08/09/2009    Qualifier: Diagnosis of  By: Sarita Haver  MD, Coralyn Mark    . Osteomyelitis     Past Surgical History  Procedure Laterality Date  . No past surgeries    . Insertion of dialysis catheter Right 01/02/2014    Procedure: INSERTION OF DIALYSIS CATHETER;  Surgeon: Rosetta Posner, MD;  Location: [facility];  Service: Vascular;  Laterality: Right;  . Av fistula placement Left 01/02/2014    Procedure: ARTERIOVENOUS (AV) FISTULA CREATION;  Surgeon: Rosetta Posner, MD;  Location: [facility];  Service: Vascular;  Laterality: Left;  . Amputation Bilateral 04/02/2015    Procedure: Amputation Bilateral Great Toes MTP Joint;  Surgeon: Newt Minion, MD;  Location: [facility];  Service: Orthopedics;  Laterality: Bilateral;  . Amputation Left 05/14/2015    Procedure: Left Transmetatarsal Amputation;  Surgeon: Newt Minion, MD;  Location: [facility];  Service: Orthopedics;  Laterality: Left;  . Orif patella Left 05/17/2015    Procedure: OPEN REDUCTION INTERNAL (ORIF) FIXATION PATELLA;  Surgeon: Newt Minion, MD;  Location: [facility];  Service: Orthopedics;  Laterality: Left;  . Amputation Right 08/20/2015    Procedure: Transmetatarsal Amputation Right Foot;  Surgeon: Newt Minion, MD;  Location: [facility];  Service: Orthopedics;  Laterality: Right;    Patient Care Team: [name], DO as PCP - General (Internal  Medicine)  Social History   Social History  . Marital Status: Married    Spouse Name: N/A  . Number of Children: N/A  . Years of Education: N/A   Occupational History  . Not on file.   Social History Main Topics  . Smoking status: Never Smoker   . Smokeless tobacco: Not on file     Comment: Prior dip  . Alcohol Use: No  . Drug Use: No  . Sexual Activity: Not on file   Other Topics Concern  . Not on file   Social History Narrative     reports that she has never smoked. She does not have any smokeless tobacco history on file. She reports that she does not drink alcohol or use illicit drugs.  Family History  Problem Relation Age of Onset  . Other Father     Drowned from fishing?  . Heart disease     Family Status  Relation Status Death Age  . Mother Deceased     Unknown  . Father Deceased     Immunization History  Administered Date(s) Administered  . Influenza,inj,Quad PF,36+ Mos 01/03/2014  . Influenza-Unspecified 09/24/2014  . PPD Test 07/28/2014  . Pneumococcal Polysaccharide-23 01/03/2014    No Known Allergies  Medications: Patient's Medications  New Prescriptions   No medications on file  Previous Medications   ACETAMINOPHEN (TYLENOL) 325 MG TABLET    Take 650 mg by mouth 3 (three) times daily. Taking every day per MAR   ALBUTEROL (PROVENTIL) (2.5 MG/3ML) 0.083% NEBULIZER SOLUTION    Take 2.5 mg by nebulization every 2 (two) hours as needed for wheezing or shortness of breath.   AMINO ACIDS-PROTEIN HYDROLYS (FEEDING SUPPLEMENT, PRO-STAT SUGAR FREE 64,) LIQD    Take 30 mLs by mouth 2 (two) times daily.   AMIODARONE (PACERONE) 200 MG TABLET    Take 200 mg by mouth daily.   ASPIRIN 81 MG TABLET    Take 81 mg by mouth daily. For hypertension   ATORVASTATIN (LIPITOR) 10 MG TABLET    Take 10 mg by mouth daily.   CALCIUM ACETATE (PHOSLO) 667 MG CAPSULE    Take 1 capsule (667 mg total) by mouth 3 (three) times daily with meals.   CLOPIDOGREL (PLAVIX) 75 MG  TABLET    Take 1 tablet (75 mg total) by mouth daily.   GUAIFENESIN (MUCINEX) 600 MG 12 HR TABLET    Take 1 tablet (600 mg total) by mouth 2 (two) times daily.   HYDROCODONE-ACETAMINOPHEN (NORCO) 10-325 MG PER TABLET    Take one tablet by mouth four times daily for pain. Not to exceed 3gm APAP from all sources/24hr   LACTULOSE (CHRONULAC) 10 GM/15ML SOLUTION    Take 30 mLs (20 g total) by mouth 2 (two) times daily as needed for moderate constipation.   METOPROLOL SUCCINATE (TOPROL-XL) 50 MG 24 HR TABLET  Take 1 tablet (50 mg total) by mouth daily. Take with or immediately following a meal.   MULTIPLE VITAMIN (MULTIVITAMIN WITH MINERALS) TABS TABLET    Take 2 tablets by mouth daily.   NITROGLYCERIN (NITROSTAT) 0.4 MG SL TABLET    Place 1 tablet (0.4 mg total) under the tongue every 5 (five) minutes as needed for chest pain.   NUTRITIONAL SUPPLEMENT LIQD    Take 60 mLs by mouth 2 (two) times daily. 2-CAL   OXYCODONE-ACETAMINOPHEN (ROXICET) 5-325 MG PER TABLET    Take 1 tablet by mouth every 4 (four) hours as needed for severe pain.   RISPERIDONE (RISPERDAL) 1 MG/ML ORAL SOLUTION    Take 0.3 mLs (0.3 mg total) by mouth 2 (two) times daily.   SENNA-DOCUSATE (SENOKOT-S) 8.6-50 MG PER TABLET    Take 1 tablet by mouth 2 (two) times daily.  Modified Medications   No medications on file  Discontinued Medications   No medications on file    Review of Systems  Unable to perform ROS: Other  deaf, blind and mute  Filed Vitals:   09/02/15 1358  BP: 104/64  Pulse: 97  Temp: 97.7 F (36.5 C)  Weight: 177 lb (80.287 kg)  SpO2: 98%   Body mass index is 26.92 kg/(m^2).  Physical Exam  Constitutional: She appears well-developed and well-nourished. No distress.  Looks uncomfortable in NAD. Lying in bed  HENT:  Mouth/Throat: Oropharynx is clear and moist. No oropharyngeal exudate.  Eyes: Pupils are equal, round, and reactive to light. No scleral icterus.  Neck: Neck supple. Carotid bruit is not  present. No tracheal deviation present. No thyromegaly present.  Cardiovascular: Normal rate, regular rhythm and intact distal pulses.  Exam reveals no gallop and no friction rub.   Murmur (1/6 SEM) heard. Trace LE edema b/l. No calf TTP. Left arm AVF with thrill and bruit  Pulmonary/Chest: Effort normal and breath sounds normal. No stridor. No respiratory distress. She has no wheezes. She has no rales.  Abdominal: Soft. Bowel sounds are normal. She exhibits no distension and no mass. There is no hepatomegaly. There is no tenderness. There is no rebound and no guarding.  Musculoskeletal: She exhibits edema and tenderness.  Right foot dressing c/d/i  Lymphadenopathy:    She has no cervical adenopathy.  Neurological: She is alert.  Skin: Skin is warm and dry. No rash noted.  Psychiatric: Her mood appears anxious. Her speech is slurred. She is agitated.     Labs reviewed: Admission on 08/19/2015, Discharged on 08/23/2015  Component Date Value Ref Range Status  . WBC 08/19/2015 6.1  4.0 - 10.5 K/uL Final  . RBC 08/19/2015 3.97  3.87 - 5.11 MIL/uL Final  . Hemoglobin 08/19/2015 11.6* 12.0 - 15.0 g/dL Final  . HCT 08/19/2015 35.7* 36.0 - 46.0 % Final  . MCV 08/19/2015 89.9  78.0 - 100.0 fL Final  . MCH 08/19/2015 29.2  26.0 - 34.0 pg Final  . MCHC 08/19/2015 32.5  30.0 - 36.0 g/dL Final  . RDW 08/19/2015 17.0* 11.5 - 15.5 % Final  . Platelets 08/19/2015 235  150 - 400 K/uL Final  . Neutrophils Relative % 08/19/2015 46  43 - 77 % Final  . Neutro Abs 08/19/2015 2.8  1.7 - 7.7 K/uL Final  . Lymphocytes Relative 08/19/2015 31  12 - 46 % Final  . Lymphs Abs 08/19/2015 1.9  0.7 - 4.0 K/uL Final  . Monocytes Relative 08/19/2015 13* 3 - 12 % Final  . Monocytes Absolute  08/19/2015 0.8  0.1 - 1.0 K/uL Final  . Eosinophils Relative 08/19/2015 8* 0 - 5 % Final  . Eosinophils Absolute 08/19/2015 0.5  0.0 - 0.7 K/uL Final  . Basophils Relative 08/19/2015 2* 0 - 1 % Final  . Basophils Absolute  08/19/2015 0.1  0.0 - 0.1 K/uL Final  . Sodium 08/19/2015 134* 135 - 145 mmol/L Final  . Potassium 08/19/2015 3.9  3.5 - 5.1 mmol/L Final  . Chloride 08/19/2015 94* 101 - 111 mmol/L Final  . CO2 08/19/2015 29  22 - 32 mmol/L Final  . Glucose, Bld 08/19/2015 102* 65 - 99 mg/dL Final  . BUN 08/19/2015 22* 6 - 20 mg/dL Final  . Creatinine, Ser 08/19/2015 4.04* 0.44 - 1.00 mg/dL Final  . Calcium 08/19/2015 9.3  8.9 - 10.3 mg/dL Final  . Total Protein 08/19/2015 7.7  6.5 - 8.1 g/dL Final  . Albumin 08/19/2015 3.6  3.5 - 5.0 g/dL Final  . AST 08/19/2015 15  15 - 41 U/L Final  . ALT 08/19/2015 16  14 - 54 U/L Final  . Alkaline Phosphatase 08/19/2015 117  38 - 126 U/L Final  . Total Bilirubin 08/19/2015 0.8  0.3 - 1.2 mg/dL Final  . GFR calc non Af Amer 08/19/2015 11* >60 mL/min Final  . GFR calc Af Amer 08/19/2015 13* >60 mL/min Final   Comment: (NOTE) The eGFR has been calculated using the CKD EPI equation. This calculation has not been validated in all clinical situations. eGFR's persistently <60 mL/min signify possible Chronic Kidney Disease.   . Anion gap 08/19/2015 11  5 - 15 Final  . Alcohol, Ethyl (B) 08/19/2015 <5  <5 mg/dL Final   Comment:        LOWEST DETECTABLE LIMIT FOR SERUM ALCOHOL IS 5 mg/dL FOR MEDICAL PURPOSES ONLY   . MRSA, PCR 08/20/2015 NEGATIVE  NEGATIVE Final  . Staphylococcus aureus 08/20/2015 NEGATIVE  NEGATIVE Final   Comment:        The Xpert SA Assay (FDA approved for NASAL specimens in patients over 46 years of age), is one component of a comprehensive surveillance program.  Test performance has been validated by [facility] for patients greater than or equal to 63 year old. It is not intended to diagnose infection nor to guide or monitor treatment.   . Glucose-Capillary 08/20/2015 80  65 - 99 mg/dL Final  . Sodium 08/20/2015 134* 135 - 145 mmol/L Final  . Potassium 08/20/2015 4.0  3.5 - 5.1 mmol/L Final  . Glucose, Bld 08/20/2015 78  65 - 99  mg/dL Final  . HCT 08/20/2015 42.0  36.0 - 46.0 % Final  . Hemoglobin 08/20/2015 14.3  12.0 - 15.0 g/dL Final  . Glucose-Capillary 08/20/2015 72  65 - 99 mg/dL Final  . Glucose-Capillary 08/20/2015 66  65 - 99 mg/dL Final  . Comment 1 08/20/2015 Notify RN   Final  . Glucose-Capillary 08/20/2015 88  65 - 99 mg/dL Final  . Glucose-Capillary 08/21/2015 99  65 - 99 mg/dL Final  . Sodium 08/21/2015 129* 135 - 145 mmol/L Final  . Potassium 08/21/2015 4.6  3.5 - 5.1 mmol/L Final  . Chloride 08/21/2015 91* 101 - 111 mmol/L Final  . CO2 08/21/2015 24  22 - 32 mmol/L Final  . Glucose, Bld 08/21/2015 104* 65 - 99 mg/dL Final  . BUN 08/21/2015 44* 6 - 20 mg/dL Final  . Creatinine, Ser 08/21/2015 6.23* 0.44 - 1.00 mg/dL Final   Comment: DELTA CHECK NOTED DIALYSIS   .  Calcium 08/21/2015 8.5* 8.9 - 10.3 mg/dL Final  . Phosphorus 08/21/2015 4.7* 2.5 - 4.6 mg/dL Final  . Albumin 08/21/2015 3.3* 3.5 - 5.0 g/dL Final  . GFR calc non Af Amer 08/21/2015 7* >60 mL/min Final  . GFR calc Af Amer 08/21/2015 8* >60 mL/min Final   Comment: (NOTE) The eGFR has been calculated using the CKD EPI equation. This calculation has not been validated in all clinical situations. eGFR's persistently <60 mL/min signify possible Chronic Kidney Disease.   . Anion gap 08/21/2015 14  5 - 15 Final  . WBC 08/21/2015 7.8  4.0 - 10.5 K/uL Final  . RBC 08/21/2015 3.47* 3.87 - 5.11 MIL/uL Final  . Hemoglobin 08/21/2015 9.9* 12.0 - 15.0 g/dL Final  . HCT 08/21/2015 31.1* 36.0 - 46.0 % Final  . MCV 08/21/2015 89.6  78.0 - 100.0 fL Final  . MCH 08/21/2015 28.5  26.0 - 34.0 pg Final  . MCHC 08/21/2015 31.8  30.0 - 36.0 g/dL Final  . RDW 08/21/2015 16.9* 11.5 - 15.5 % Final  . Platelets 08/21/2015 200  150 - 400 K/uL Final  . Hepatitis B Surface Ag 08/21/2015 Negative  Negative Final   Comment: (NOTE) Performed At: [facility] [place], [place] [id number] Lindon Romp MD HQ:[id number]   . WBC  08/23/2015 6.0  4.0 - 10.5 K/uL Final  . RBC 08/23/2015 3.23* 3.87 - 5.11 MIL/uL Final  . Hemoglobin 08/23/2015 9.6* 12.0 - 15.0 g/dL Final  . HCT 08/23/2015 29.3* 36.0 - 46.0 % Final  . MCV 08/23/2015 90.7  78.0 - 100.0 fL Final  . MCH 08/23/2015 29.7  26.0 - 34.0 pg Final  . MCHC 08/23/2015 32.8  30.0 - 36.0 g/dL Final  . RDW 08/23/2015 17.1* 11.5 - 15.5 % Final  . Platelets 08/23/2015 206  150 - 400 K/uL Final  . Neutrophils Relative % 08/23/2015 57  43 - 77 % Final  . Neutro Abs 08/23/2015 3.4  1.7 - 7.7 K/uL Final  . Lymphocytes Relative 08/23/2015 19  12 - 46 % Final  . Lymphs Abs 08/23/2015 1.1  0.7 - 4.0 K/uL Final  . Monocytes Relative 08/23/2015 14* 3 - 12 % Final  . Monocytes Absolute 08/23/2015 0.8  0.1 - 1.0 K/uL Final  . Eosinophils Relative 08/23/2015 9* 0 - 5 % Final  . Eosinophils Absolute 08/23/2015 0.6  0.0 - 0.7 K/uL Final  . Basophils Relative 08/23/2015 1  0 - 1 % Final  . Basophils Absolute 08/23/2015 0.1  0.0 - 0.1 K/uL Final  . Sodium 08/23/2015 131* 135 - 145 mmol/L Final  . Potassium 08/23/2015 4.5  3.5 - 5.1 mmol/L Final  . Chloride 08/23/2015 96* 101 - 111 mmol/L Final  . CO2 08/23/2015 26  22 - 32 mmol/L Final  . Glucose, Bld 08/23/2015 97  65 - 99 mg/dL Final  . BUN 08/23/2015 45* 6 - 20 mg/dL Final  . Creatinine, Ser 08/23/2015 6.04* 0.44 - 1.00 mg/dL Final  . Calcium 08/23/2015 8.5* 8.9 - 10.3 mg/dL Final  . Phosphorus 08/23/2015 3.4  2.5 - 4.6 mg/dL Final  . Albumin 08/23/2015 2.9* 3.5 - 5.0 g/dL Final  . GFR calc non Af Amer 08/23/2015 7* >60 mL/min Final  . GFR calc Af Amer 08/23/2015 8* >60 mL/min Final   Comment: (NOTE) The eGFR has been calculated using the CKD EPI equation. This calculation has not been validated in all clinical situations. eGFR's persistently <60 mL/min signify possible Chronic Kidney Disease.   Marland Kitchen  Anion gap 08/23/2015 9  5 - 15 Final    No results found.   Assessment/Plan   ICD-9-CM ICD-10-CM   1. 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 S/P transmetatarsal amputation of foot, right  V49.73 Z89.431    due to right 2nd toe nonhealing osteomyelitis - stable  3. End stage renal disease on dialysis on MWF via left arm AVF 585.6 N18.6    V45.11 Z99.2   4. Chronic diastolic CHF (congestive heart failure) - stable 428.32 I50.32    428.0    5. Essential hypertension - stable 401.9 I10   6. Hx of Sustained VT- not an ICD candidate - stable 427.1 I47.2   7. HLD (hyperlipidemia) - stable 272.4 E78.5   8. Other constipation - stable 564.09 K59.09   9. PAD (peripheral artery disease) - unchanged 443.9 I73.9   10. Blindness with deafness - chronic 369.00 H54.0    V49.85 Z73.82    389.9 H91.90     --PT as ordered  --f/u with Dr Sharol Given  as scheduled for mx right TMA  --HD as ordered MWF   --cont nutritional supplement as ordered  --cont current meds as ordered  --GOAL: short term rehab then continue [facility] term [facility] Communicated with pt and nursing.  --will follow  Angelize Ryce S. Perlie Gold  [facility] [address] [place], [place] [address] [phone] Cell (Monday-Friday 8 AM - 5 PM) [phone] After 5 PM and follow prompts

## 2015-09-08 DIAGNOSIS — J200 Acute bronchitis due to Mycoplasma pneumoniae: Secondary | ICD-10-CM | POA: Diagnosis not present

## 2015-09-08 DIAGNOSIS — E785 Hyperlipidemia, unspecified: Secondary | ICD-10-CM | POA: Diagnosis not present

## 2015-09-08 DIAGNOSIS — I509 Heart failure, unspecified: Secondary | ICD-10-CM | POA: Diagnosis not present

## 2015-09-08 DIAGNOSIS — N2581 Secondary hyperparathyroidism of renal origin: Secondary | ICD-10-CM | POA: Diagnosis not present

## 2015-09-08 DIAGNOSIS — N186 End stage renal disease: Secondary | ICD-10-CM | POA: Diagnosis not present

## 2015-09-08 DIAGNOSIS — H540 Blindness, both eyes: Secondary | ICD-10-CM | POA: Diagnosis not present

## 2015-09-08 DIAGNOSIS — Z23 Encounter for immunization: Secondary | ICD-10-CM | POA: Diagnosis not present

## 2015-09-08 DIAGNOSIS — D509 Iron deficiency anemia, unspecified: Secondary | ICD-10-CM | POA: Diagnosis not present

## 2015-09-08 DIAGNOSIS — E875 Hyperkalemia: Secondary | ICD-10-CM | POA: Diagnosis not present

## 2015-09-08 DIAGNOSIS — E119 Type 2 diabetes mellitus without complications: Secondary | ICD-10-CM | POA: Diagnosis not present

## 2015-09-08 DIAGNOSIS — E1129 Type 2 diabetes mellitus with other diabetic kidney complication: Secondary | ICD-10-CM | POA: Diagnosis not present

## 2015-09-08 DIAGNOSIS — D631 Anemia in chronic kidney disease: Secondary | ICD-10-CM | POA: Diagnosis not present

## 2015-09-09 DIAGNOSIS — J200 Acute bronchitis due to Mycoplasma pneumoniae: Secondary | ICD-10-CM | POA: Diagnosis not present

## 2015-09-09 DIAGNOSIS — E785 Hyperlipidemia, unspecified: Secondary | ICD-10-CM | POA: Diagnosis not present

## 2015-09-09 DIAGNOSIS — E119 Type 2 diabetes mellitus without complications: Secondary | ICD-10-CM | POA: Diagnosis not present

## 2015-09-09 DIAGNOSIS — H540 Blindness, both eyes: Secondary | ICD-10-CM | POA: Diagnosis not present

## 2015-09-09 DIAGNOSIS — E875 Hyperkalemia: Secondary | ICD-10-CM | POA: Diagnosis not present

## 2015-09-09 DIAGNOSIS — I509 Heart failure, unspecified: Secondary | ICD-10-CM | POA: Diagnosis not present

## 2015-09-10 DIAGNOSIS — E875 Hyperkalemia: Secondary | ICD-10-CM | POA: Diagnosis not present

## 2015-09-10 DIAGNOSIS — D631 Anemia in chronic kidney disease: Secondary | ICD-10-CM | POA: Diagnosis not present

## 2015-09-10 DIAGNOSIS — J200 Acute bronchitis due to Mycoplasma pneumoniae: Secondary | ICD-10-CM | POA: Diagnosis not present

## 2015-09-10 DIAGNOSIS — Z23 Encounter for immunization: Secondary | ICD-10-CM | POA: Diagnosis not present

## 2015-09-10 DIAGNOSIS — E1129 Type 2 diabetes mellitus with other diabetic kidney complication: Secondary | ICD-10-CM | POA: Diagnosis not present

## 2015-09-10 DIAGNOSIS — I509 Heart failure, unspecified: Secondary | ICD-10-CM | POA: Diagnosis not present

## 2015-09-10 DIAGNOSIS — E119 Type 2 diabetes mellitus without complications: Secondary | ICD-10-CM | POA: Diagnosis not present

## 2015-09-10 DIAGNOSIS — D509 Iron deficiency anemia, unspecified: Secondary | ICD-10-CM | POA: Diagnosis not present

## 2015-09-10 DIAGNOSIS — H540 Blindness, both eyes: Secondary | ICD-10-CM | POA: Diagnosis not present

## 2015-09-10 DIAGNOSIS — N2581 Secondary hyperparathyroidism of renal origin: Secondary | ICD-10-CM | POA: Diagnosis not present

## 2015-09-10 DIAGNOSIS — E785 Hyperlipidemia, unspecified: Secondary | ICD-10-CM | POA: Diagnosis not present

## 2015-09-10 DIAGNOSIS — N186 End stage renal disease: Secondary | ICD-10-CM | POA: Diagnosis not present

## 2015-09-13 DIAGNOSIS — I509 Heart failure, unspecified: Secondary | ICD-10-CM | POA: Diagnosis not present

## 2015-09-13 DIAGNOSIS — N186 End stage renal disease: Secondary | ICD-10-CM | POA: Diagnosis not present

## 2015-09-13 DIAGNOSIS — Z23 Encounter for immunization: Secondary | ICD-10-CM | POA: Diagnosis not present

## 2015-09-13 DIAGNOSIS — E119 Type 2 diabetes mellitus without complications: Secondary | ICD-10-CM | POA: Diagnosis not present

## 2015-09-13 DIAGNOSIS — H540 Blindness, both eyes: Secondary | ICD-10-CM | POA: Diagnosis not present

## 2015-09-13 DIAGNOSIS — E875 Hyperkalemia: Secondary | ICD-10-CM | POA: Diagnosis not present

## 2015-09-13 DIAGNOSIS — E785 Hyperlipidemia, unspecified: Secondary | ICD-10-CM | POA: Diagnosis not present

## 2015-09-13 DIAGNOSIS — D631 Anemia in chronic kidney disease: Secondary | ICD-10-CM | POA: Diagnosis not present

## 2015-09-13 DIAGNOSIS — D509 Iron deficiency anemia, unspecified: Secondary | ICD-10-CM | POA: Diagnosis not present

## 2015-09-13 DIAGNOSIS — J200 Acute bronchitis due to Mycoplasma pneumoniae: Secondary | ICD-10-CM | POA: Diagnosis not present

## 2015-09-13 DIAGNOSIS — N2581 Secondary hyperparathyroidism of renal origin: Secondary | ICD-10-CM | POA: Diagnosis not present

## 2015-09-13 DIAGNOSIS — E1129 Type 2 diabetes mellitus with other diabetic kidney complication: Secondary | ICD-10-CM | POA: Diagnosis not present

## 2015-09-14 DIAGNOSIS — E785 Hyperlipidemia, unspecified: Secondary | ICD-10-CM | POA: Diagnosis not present

## 2015-09-14 DIAGNOSIS — H540 Blindness, both eyes: Secondary | ICD-10-CM | POA: Diagnosis not present

## 2015-09-14 DIAGNOSIS — I509 Heart failure, unspecified: Secondary | ICD-10-CM | POA: Diagnosis not present

## 2015-09-14 DIAGNOSIS — E875 Hyperkalemia: Secondary | ICD-10-CM | POA: Diagnosis not present

## 2015-09-14 DIAGNOSIS — J200 Acute bronchitis due to Mycoplasma pneumoniae: Secondary | ICD-10-CM | POA: Diagnosis not present

## 2015-09-14 DIAGNOSIS — E119 Type 2 diabetes mellitus without complications: Secondary | ICD-10-CM | POA: Diagnosis not present

## 2015-09-15 DIAGNOSIS — J200 Acute bronchitis due to Mycoplasma pneumoniae: Secondary | ICD-10-CM | POA: Diagnosis not present

## 2015-09-15 DIAGNOSIS — E875 Hyperkalemia: Secondary | ICD-10-CM | POA: Diagnosis not present

## 2015-09-15 DIAGNOSIS — D509 Iron deficiency anemia, unspecified: Secondary | ICD-10-CM | POA: Diagnosis not present

## 2015-09-15 DIAGNOSIS — N186 End stage renal disease: Secondary | ICD-10-CM | POA: Diagnosis not present

## 2015-09-15 DIAGNOSIS — D631 Anemia in chronic kidney disease: Secondary | ICD-10-CM | POA: Diagnosis not present

## 2015-09-15 DIAGNOSIS — I509 Heart failure, unspecified: Secondary | ICD-10-CM | POA: Diagnosis not present

## 2015-09-15 DIAGNOSIS — H540 Blindness, both eyes: Secondary | ICD-10-CM | POA: Diagnosis not present

## 2015-09-15 DIAGNOSIS — E785 Hyperlipidemia, unspecified: Secondary | ICD-10-CM | POA: Diagnosis not present

## 2015-09-15 DIAGNOSIS — Z23 Encounter for immunization: Secondary | ICD-10-CM | POA: Diagnosis not present

## 2015-09-15 DIAGNOSIS — E1129 Type 2 diabetes mellitus with other diabetic kidney complication: Secondary | ICD-10-CM | POA: Diagnosis not present

## 2015-09-15 DIAGNOSIS — N2581 Secondary hyperparathyroidism of renal origin: Secondary | ICD-10-CM | POA: Diagnosis not present

## 2015-09-15 DIAGNOSIS — E119 Type 2 diabetes mellitus without complications: Secondary | ICD-10-CM | POA: Diagnosis not present

## 2015-09-16 DIAGNOSIS — E875 Hyperkalemia: Secondary | ICD-10-CM | POA: Diagnosis not present

## 2015-09-16 DIAGNOSIS — E119 Type 2 diabetes mellitus without complications: Secondary | ICD-10-CM | POA: Diagnosis not present

## 2015-09-16 DIAGNOSIS — H540 Blindness, both eyes: Secondary | ICD-10-CM | POA: Diagnosis not present

## 2015-09-16 DIAGNOSIS — J200 Acute bronchitis due to Mycoplasma pneumoniae: Secondary | ICD-10-CM | POA: Diagnosis not present

## 2015-09-16 DIAGNOSIS — I509 Heart failure, unspecified: Secondary | ICD-10-CM | POA: Diagnosis not present

## 2015-09-16 DIAGNOSIS — E785 Hyperlipidemia, unspecified: Secondary | ICD-10-CM | POA: Diagnosis not present

## 2015-09-17 DIAGNOSIS — J200 Acute bronchitis due to Mycoplasma pneumoniae: Secondary | ICD-10-CM | POA: Diagnosis not present

## 2015-09-17 DIAGNOSIS — N186 End stage renal disease: Secondary | ICD-10-CM | POA: Diagnosis not present

## 2015-09-17 DIAGNOSIS — I509 Heart failure, unspecified: Secondary | ICD-10-CM | POA: Diagnosis not present

## 2015-09-17 DIAGNOSIS — E119 Type 2 diabetes mellitus without complications: Secondary | ICD-10-CM | POA: Diagnosis not present

## 2015-09-17 DIAGNOSIS — D509 Iron deficiency anemia, unspecified: Secondary | ICD-10-CM | POA: Diagnosis not present

## 2015-09-17 DIAGNOSIS — D631 Anemia in chronic kidney disease: Secondary | ICD-10-CM | POA: Diagnosis not present

## 2015-09-17 DIAGNOSIS — Z23 Encounter for immunization: Secondary | ICD-10-CM | POA: Diagnosis not present

## 2015-09-17 DIAGNOSIS — E875 Hyperkalemia: Secondary | ICD-10-CM | POA: Diagnosis not present

## 2015-09-17 DIAGNOSIS — N2581 Secondary hyperparathyroidism of renal origin: Secondary | ICD-10-CM | POA: Diagnosis not present

## 2015-09-17 DIAGNOSIS — H540 Blindness, both eyes: Secondary | ICD-10-CM | POA: Diagnosis not present

## 2015-09-17 DIAGNOSIS — E1129 Type 2 diabetes mellitus with other diabetic kidney complication: Secondary | ICD-10-CM | POA: Diagnosis not present

## 2015-09-17 DIAGNOSIS — E785 Hyperlipidemia, unspecified: Secondary | ICD-10-CM | POA: Diagnosis not present

## 2015-09-20 DIAGNOSIS — D631 Anemia in chronic kidney disease: Secondary | ICD-10-CM | POA: Diagnosis not present

## 2015-09-20 DIAGNOSIS — E1129 Type 2 diabetes mellitus with other diabetic kidney complication: Secondary | ICD-10-CM | POA: Diagnosis not present

## 2015-09-20 DIAGNOSIS — N186 End stage renal disease: Secondary | ICD-10-CM | POA: Diagnosis not present

## 2015-09-20 DIAGNOSIS — H540 Blindness, both eyes: Secondary | ICD-10-CM | POA: Diagnosis not present

## 2015-09-20 DIAGNOSIS — E785 Hyperlipidemia, unspecified: Secondary | ICD-10-CM | POA: Diagnosis not present

## 2015-09-20 DIAGNOSIS — J200 Acute bronchitis due to Mycoplasma pneumoniae: Secondary | ICD-10-CM | POA: Diagnosis not present

## 2015-09-20 DIAGNOSIS — I509 Heart failure, unspecified: Secondary | ICD-10-CM | POA: Diagnosis not present

## 2015-09-20 DIAGNOSIS — D509 Iron deficiency anemia, unspecified: Secondary | ICD-10-CM | POA: Diagnosis not present

## 2015-09-20 DIAGNOSIS — E875 Hyperkalemia: Secondary | ICD-10-CM | POA: Diagnosis not present

## 2015-09-20 DIAGNOSIS — Z23 Encounter for immunization: Secondary | ICD-10-CM | POA: Diagnosis not present

## 2015-09-20 DIAGNOSIS — E119 Type 2 diabetes mellitus without complications: Secondary | ICD-10-CM | POA: Diagnosis not present

## 2015-09-20 DIAGNOSIS — N2581 Secondary hyperparathyroidism of renal origin: Secondary | ICD-10-CM | POA: Diagnosis not present

## 2015-09-21 DIAGNOSIS — J200 Acute bronchitis due to Mycoplasma pneumoniae: Secondary | ICD-10-CM | POA: Diagnosis not present

## 2015-09-21 DIAGNOSIS — I509 Heart failure, unspecified: Secondary | ICD-10-CM | POA: Diagnosis not present

## 2015-09-21 DIAGNOSIS — E785 Hyperlipidemia, unspecified: Secondary | ICD-10-CM | POA: Diagnosis not present

## 2015-09-21 DIAGNOSIS — E119 Type 2 diabetes mellitus without complications: Secondary | ICD-10-CM | POA: Diagnosis not present

## 2015-09-21 DIAGNOSIS — H540 Blindness, both eyes: Secondary | ICD-10-CM | POA: Diagnosis not present

## 2015-09-21 DIAGNOSIS — E875 Hyperkalemia: Secondary | ICD-10-CM | POA: Diagnosis not present

## 2015-09-22 DIAGNOSIS — Z23 Encounter for immunization: Secondary | ICD-10-CM | POA: Diagnosis not present

## 2015-09-22 DIAGNOSIS — E1129 Type 2 diabetes mellitus with other diabetic kidney complication: Secondary | ICD-10-CM | POA: Diagnosis not present

## 2015-09-22 DIAGNOSIS — I509 Heart failure, unspecified: Secondary | ICD-10-CM | POA: Diagnosis not present

## 2015-09-22 DIAGNOSIS — D631 Anemia in chronic kidney disease: Secondary | ICD-10-CM | POA: Diagnosis not present

## 2015-09-22 DIAGNOSIS — H540 Blindness, both eyes: Secondary | ICD-10-CM | POA: Diagnosis not present

## 2015-09-22 DIAGNOSIS — D509 Iron deficiency anemia, unspecified: Secondary | ICD-10-CM | POA: Diagnosis not present

## 2015-09-22 DIAGNOSIS — N186 End stage renal disease: Secondary | ICD-10-CM | POA: Diagnosis not present

## 2015-09-22 DIAGNOSIS — J200 Acute bronchitis due to Mycoplasma pneumoniae: Secondary | ICD-10-CM | POA: Diagnosis not present

## 2015-09-22 DIAGNOSIS — E119 Type 2 diabetes mellitus without complications: Secondary | ICD-10-CM | POA: Diagnosis not present

## 2015-09-22 DIAGNOSIS — N2581 Secondary hyperparathyroidism of renal origin: Secondary | ICD-10-CM | POA: Diagnosis not present

## 2015-09-22 DIAGNOSIS — E875 Hyperkalemia: Secondary | ICD-10-CM | POA: Diagnosis not present

## 2015-09-22 DIAGNOSIS — E785 Hyperlipidemia, unspecified: Secondary | ICD-10-CM | POA: Diagnosis not present

## 2015-09-23 ENCOUNTER — Non-Acute Institutional Stay (SKILLED_NURSING_FACILITY): Payer: Medicare Other | Admitting: Adult Health

## 2015-09-23 DIAGNOSIS — F209 Schizophrenia, unspecified: Secondary | ICD-10-CM | POA: Diagnosis not present

## 2015-09-23 DIAGNOSIS — I509 Heart failure, unspecified: Secondary | ICD-10-CM | POA: Diagnosis not present

## 2015-09-23 DIAGNOSIS — R627 Adult failure to thrive: Secondary | ICD-10-CM

## 2015-09-23 DIAGNOSIS — J200 Acute bronchitis due to Mycoplasma pneumoniae: Secondary | ICD-10-CM | POA: Diagnosis not present

## 2015-09-23 DIAGNOSIS — N186 End stage renal disease: Secondary | ICD-10-CM

## 2015-09-23 DIAGNOSIS — H540 Blindness, both eyes: Secondary | ICD-10-CM | POA: Diagnosis not present

## 2015-09-23 DIAGNOSIS — I472 Ventricular tachycardia, unspecified: Secondary | ICD-10-CM

## 2015-09-23 DIAGNOSIS — I255 Ischemic cardiomyopathy: Secondary | ICD-10-CM

## 2015-09-23 DIAGNOSIS — E875 Hyperkalemia: Secondary | ICD-10-CM | POA: Diagnosis not present

## 2015-09-23 DIAGNOSIS — E119 Type 2 diabetes mellitus without complications: Secondary | ICD-10-CM | POA: Diagnosis not present

## 2015-09-23 DIAGNOSIS — F329 Major depressive disorder, single episode, unspecified: Secondary | ICD-10-CM | POA: Diagnosis not present

## 2015-09-23 DIAGNOSIS — E785 Hyperlipidemia, unspecified: Secondary | ICD-10-CM | POA: Diagnosis not present

## 2015-09-24 DIAGNOSIS — Z23 Encounter for immunization: Secondary | ICD-10-CM | POA: Diagnosis not present

## 2015-09-24 DIAGNOSIS — N186 End stage renal disease: Secondary | ICD-10-CM | POA: Diagnosis not present

## 2015-09-24 DIAGNOSIS — Z992 Dependence on renal dialysis: Secondary | ICD-10-CM | POA: Diagnosis not present

## 2015-09-24 DIAGNOSIS — E1129 Type 2 diabetes mellitus with other diabetic kidney complication: Secondary | ICD-10-CM | POA: Diagnosis not present

## 2015-09-24 DIAGNOSIS — N2581 Secondary hyperparathyroidism of renal origin: Secondary | ICD-10-CM | POA: Diagnosis not present

## 2015-09-24 DIAGNOSIS — D509 Iron deficiency anemia, unspecified: Secondary | ICD-10-CM | POA: Diagnosis not present

## 2015-09-24 DIAGNOSIS — D631 Anemia in chronic kidney disease: Secondary | ICD-10-CM | POA: Diagnosis not present

## 2015-09-27 DIAGNOSIS — N2581 Secondary hyperparathyroidism of renal origin: Secondary | ICD-10-CM | POA: Diagnosis not present

## 2015-09-27 DIAGNOSIS — E1129 Type 2 diabetes mellitus with other diabetic kidney complication: Secondary | ICD-10-CM | POA: Diagnosis not present

## 2015-09-27 DIAGNOSIS — N186 End stage renal disease: Secondary | ICD-10-CM | POA: Diagnosis not present

## 2015-09-27 DIAGNOSIS — D509 Iron deficiency anemia, unspecified: Secondary | ICD-10-CM | POA: Diagnosis not present

## 2015-09-27 DIAGNOSIS — D631 Anemia in chronic kidney disease: Secondary | ICD-10-CM | POA: Diagnosis not present

## 2015-09-29 DIAGNOSIS — D631 Anemia in chronic kidney disease: Secondary | ICD-10-CM | POA: Diagnosis not present

## 2015-09-29 DIAGNOSIS — N2581 Secondary hyperparathyroidism of renal origin: Secondary | ICD-10-CM | POA: Diagnosis not present

## 2015-09-29 DIAGNOSIS — E1129 Type 2 diabetes mellitus with other diabetic kidney complication: Secondary | ICD-10-CM | POA: Diagnosis not present

## 2015-09-29 DIAGNOSIS — N186 End stage renal disease: Secondary | ICD-10-CM | POA: Diagnosis not present

## 2015-09-29 DIAGNOSIS — D509 Iron deficiency anemia, unspecified: Secondary | ICD-10-CM | POA: Diagnosis not present

## 2015-10-01 DIAGNOSIS — E1129 Type 2 diabetes mellitus with other diabetic kidney complication: Secondary | ICD-10-CM | POA: Diagnosis not present

## 2015-10-01 DIAGNOSIS — D631 Anemia in chronic kidney disease: Secondary | ICD-10-CM | POA: Diagnosis not present

## 2015-10-01 DIAGNOSIS — N2581 Secondary hyperparathyroidism of renal origin: Secondary | ICD-10-CM | POA: Diagnosis not present

## 2015-10-01 DIAGNOSIS — N186 End stage renal disease: Secondary | ICD-10-CM | POA: Diagnosis not present

## 2015-10-01 DIAGNOSIS — D509 Iron deficiency anemia, unspecified: Secondary | ICD-10-CM | POA: Diagnosis not present

## 2015-10-04 DIAGNOSIS — N2581 Secondary hyperparathyroidism of renal origin: Secondary | ICD-10-CM | POA: Diagnosis not present

## 2015-10-04 DIAGNOSIS — N186 End stage renal disease: Secondary | ICD-10-CM | POA: Diagnosis not present

## 2015-10-04 DIAGNOSIS — F329 Major depressive disorder, single episode, unspecified: Secondary | ICD-10-CM | POA: Diagnosis not present

## 2015-10-04 DIAGNOSIS — D509 Iron deficiency anemia, unspecified: Secondary | ICD-10-CM | POA: Diagnosis not present

## 2015-10-04 DIAGNOSIS — F4320 Adjustment disorder, unspecified: Secondary | ICD-10-CM | POA: Diagnosis not present

## 2015-10-04 DIAGNOSIS — E1129 Type 2 diabetes mellitus with other diabetic kidney complication: Secondary | ICD-10-CM | POA: Diagnosis not present

## 2015-10-04 DIAGNOSIS — F209 Schizophrenia, unspecified: Secondary | ICD-10-CM | POA: Diagnosis not present

## 2015-10-04 DIAGNOSIS — D631 Anemia in chronic kidney disease: Secondary | ICD-10-CM | POA: Diagnosis not present

## 2015-10-06 ENCOUNTER — Non-Acute Institutional Stay (SKILLED_NURSING_FACILITY): Payer: Medicare Other | Admitting: Adult Health

## 2015-10-06 DIAGNOSIS — E1129 Type 2 diabetes mellitus with other diabetic kidney complication: Secondary | ICD-10-CM | POA: Diagnosis not present

## 2015-10-06 DIAGNOSIS — N186 End stage renal disease: Secondary | ICD-10-CM | POA: Diagnosis not present

## 2015-10-06 DIAGNOSIS — I472 Ventricular tachycardia, unspecified: Secondary | ICD-10-CM

## 2015-10-06 DIAGNOSIS — D509 Iron deficiency anemia, unspecified: Secondary | ICD-10-CM | POA: Diagnosis not present

## 2015-10-06 DIAGNOSIS — I739 Peripheral vascular disease, unspecified: Secondary | ICD-10-CM | POA: Diagnosis not present

## 2015-10-06 DIAGNOSIS — I10 Essential (primary) hypertension: Secondary | ICD-10-CM

## 2015-10-06 DIAGNOSIS — I5041 Acute combined systolic (congestive) and diastolic (congestive) heart failure: Secondary | ICD-10-CM

## 2015-10-06 DIAGNOSIS — N2581 Secondary hyperparathyroidism of renal origin: Secondary | ICD-10-CM | POA: Diagnosis not present

## 2015-10-06 DIAGNOSIS — D631 Anemia in chronic kidney disease: Secondary | ICD-10-CM | POA: Diagnosis not present

## 2015-10-08 DIAGNOSIS — D631 Anemia in chronic kidney disease: Secondary | ICD-10-CM | POA: Diagnosis not present

## 2015-10-08 DIAGNOSIS — E1129 Type 2 diabetes mellitus with other diabetic kidney complication: Secondary | ICD-10-CM | POA: Diagnosis not present

## 2015-10-08 DIAGNOSIS — N2581 Secondary hyperparathyroidism of renal origin: Secondary | ICD-10-CM | POA: Diagnosis not present

## 2015-10-08 DIAGNOSIS — D509 Iron deficiency anemia, unspecified: Secondary | ICD-10-CM | POA: Diagnosis not present

## 2015-10-08 DIAGNOSIS — N186 End stage renal disease: Secondary | ICD-10-CM | POA: Diagnosis not present

## 2015-10-11 DIAGNOSIS — D631 Anemia in chronic kidney disease: Secondary | ICD-10-CM | POA: Diagnosis not present

## 2015-10-11 DIAGNOSIS — D509 Iron deficiency anemia, unspecified: Secondary | ICD-10-CM | POA: Diagnosis not present

## 2015-10-11 DIAGNOSIS — E1129 Type 2 diabetes mellitus with other diabetic kidney complication: Secondary | ICD-10-CM | POA: Diagnosis not present

## 2015-10-11 DIAGNOSIS — N186 End stage renal disease: Secondary | ICD-10-CM | POA: Diagnosis not present

## 2015-10-11 DIAGNOSIS — N2581 Secondary hyperparathyroidism of renal origin: Secondary | ICD-10-CM | POA: Diagnosis not present

## 2015-10-12 DIAGNOSIS — M25562 Pain in left knee: Secondary | ICD-10-CM | POA: Diagnosis not present

## 2015-10-13 DIAGNOSIS — N186 End stage renal disease: Secondary | ICD-10-CM | POA: Diagnosis not present

## 2015-10-13 DIAGNOSIS — D509 Iron deficiency anemia, unspecified: Secondary | ICD-10-CM | POA: Diagnosis not present

## 2015-10-13 DIAGNOSIS — D631 Anemia in chronic kidney disease: Secondary | ICD-10-CM | POA: Diagnosis not present

## 2015-10-13 DIAGNOSIS — E1129 Type 2 diabetes mellitus with other diabetic kidney complication: Secondary | ICD-10-CM | POA: Diagnosis not present

## 2015-10-13 DIAGNOSIS — N2581 Secondary hyperparathyroidism of renal origin: Secondary | ICD-10-CM | POA: Diagnosis not present

## 2015-10-15 DIAGNOSIS — D509 Iron deficiency anemia, unspecified: Secondary | ICD-10-CM | POA: Diagnosis not present

## 2015-10-15 DIAGNOSIS — N2581 Secondary hyperparathyroidism of renal origin: Secondary | ICD-10-CM | POA: Diagnosis not present

## 2015-10-15 DIAGNOSIS — E1129 Type 2 diabetes mellitus with other diabetic kidney complication: Secondary | ICD-10-CM | POA: Diagnosis not present

## 2015-10-15 DIAGNOSIS — D631 Anemia in chronic kidney disease: Secondary | ICD-10-CM | POA: Diagnosis not present

## 2015-10-15 DIAGNOSIS — N186 End stage renal disease: Secondary | ICD-10-CM | POA: Diagnosis not present

## 2015-10-18 DIAGNOSIS — N2581 Secondary hyperparathyroidism of renal origin: Secondary | ICD-10-CM | POA: Diagnosis not present

## 2015-10-18 DIAGNOSIS — E1129 Type 2 diabetes mellitus with other diabetic kidney complication: Secondary | ICD-10-CM | POA: Diagnosis not present

## 2015-10-18 DIAGNOSIS — N186 End stage renal disease: Secondary | ICD-10-CM | POA: Diagnosis not present

## 2015-10-18 DIAGNOSIS — D631 Anemia in chronic kidney disease: Secondary | ICD-10-CM | POA: Diagnosis not present

## 2015-10-18 DIAGNOSIS — D509 Iron deficiency anemia, unspecified: Secondary | ICD-10-CM | POA: Diagnosis not present

## 2015-10-19 ENCOUNTER — Encounter: Payer: Self-pay | Admitting: Adult Health

## 2015-10-19 MED ORDER — RISPERIDONE 0.25 MG PO TABS: 0.2500 mg | ORAL_TABLET | Freq: Every day | ORAL | Status: DC

## 2015-10-19 NOTE — Progress Notes (Signed)
Patient ID: [name], female   DOB: Sep 21, 1956, 59 y.o.   MRN: [id number]    Facility: [facility]      No Known Allergies  Chief Complaint  Patient presents with  . Acute Visit    medication review    HPI:  The psychiatric NP has seen her this AM regarding her medications. She feels as though people are slipping medications into her food. There was a prolonged discussion and she willing to take only certain medications. We will make certain changes in order to help with her ability to be adherent to her medication regimen.     Past Medical History  Diagnosis Date  . Hyperlipidemia   . Hypertension     a. Has previously refused blood pressure meds.   . Deaf     USE SIGN INTERPRETER.  Divorced from husband but lives with him. Has daughter but she does not care for her.  . Bilateral leg edema     a. Chronic.  Marland Kitchen Psychiatric disorder     She frequently exhibits paranoia and has been diagnosed with psychotic d/o NOS during hospital stay in the past. She is tangetial and perseverative during her visits. She apparently has had a bad experience with mental health in [place] in the past and refuses to discuss mental issues for fear that she will be sent back there. Her paranoia, communication issues, financial woes and lack of fam  . Fibroid uterus   . Anemia     Due to fibroids.  . Heart murmur   . Diabetes mellitus     a. Per PCP note 2012 (A1C 9.2) - pt unwilling to take meds and was educated on risk of uncontrolled DM.  b. A1C 5.9 in 11/2013.  Marland Kitchen Coronary artery disease   . Blind   . ESRD on hemodialysis ([place])     Mon, Wed, Fri  . CHF (congestive heart failure) ([place])   . MI (myocardial infarction) ([place]) 11/2014  . Essential hypertension 08/09/2009    Qualifier: Diagnosis of  By: Sarita Haver  MD, Coralyn Mark    . Osteomyelitis [facility])     Past Surgical History  Procedure Laterality Date  . No past surgeries    . Insertion of dialysis catheter Right 01/02/2014    Procedure:  INSERTION OF DIALYSIS CATHETER;  Surgeon: Rosetta Posner, MD;  Location: [place];  Service: Vascular;  Laterality: Right;  . Av fistula placement Left 01/02/2014    Procedure: ARTERIOVENOUS (AV) FISTULA CREATION;  Surgeon: Rosetta Posner, MD;  Location: [place];  Service: Vascular;  Laterality: Left;  . Amputation Bilateral 04/02/2015    Procedure: Amputation Bilateral Great Toes MTP Joint;  Surgeon: Newt Minion, MD;  Location: [place];  Service: Orthopedics;  Laterality: Bilateral;  . Amputation Left 05/14/2015    Procedure: Left Transmetatarsal Amputation;  Surgeon: Newt Minion, MD;  Location: [place];  Service: Orthopedics;  Laterality: Left;  . Orif patella Left 05/17/2015    Procedure: OPEN REDUCTION INTERNAL (ORIF) FIXATION PATELLA;  Surgeon: Newt Minion, MD;  Location: [place];  Service: Orthopedics;  Laterality: Left;  . Amputation Right 08/20/2015    Procedure: Transmetatarsal Amputation Right Foot;  Surgeon: Newt Minion, MD;  Location: [place];  Service: Orthopedics;  Laterality: Right;    VITAL SIGNS BP 105/70 mmHg  Pulse 84  Ht 5' 8"  (1.727 m)  Wt 177 lb (80.287 kg)  BMI 26.92 kg/m2  SpO2 96%  Patient's Medications  New  Prescriptions   No medications on file  Previous Medications   ACETAMINOPHEN (TYLENOL) 325 MG TABLET    Take 650 mg by mouth 3 (three) times daily. Taking every day per MAR   ALBUTEROL (PROVENTIL) (2.5 MG/3ML) 0.083% NEBULIZER SOLUTION    Take 2.5 mg by nebulization every 2 (two) hours as needed for wheezing or shortness of breath.   AMINO ACIDS-PROTEIN HYDROLYS (FEEDING SUPPLEMENT, PRO-STAT SUGAR FREE 64,) LIQD    Take 30 mLs by mouth 2 (two) times daily.   AMIODARONE (PACERONE) 200 MG TABLET    Take 200 mg by mouth daily.   ASPIRIN 81 MG TABLET    Take 81 mg by mouth daily. For hypertension   ATORVASTATIN (LIPITOR) 10 MG TABLET    Take 10 mg by mouth daily.   CALCIUM ACETATE (PHOSLO) 667 MG CAPSULE    Take 1 capsule (667 mg total) by mouth 3 (three) times daily with  meals.   CLOPIDOGREL (PLAVIX) 75 MG TABLET    Take 1 tablet (75 mg total) by mouth daily.   GUAIFENESIN (MUCINEX) 600 MG 12 HR TABLET    Take 1 tablet (600 mg total) by mouth 2 (two) times daily.   HYDROCODONE-ACETAMINOPHEN (NORCO) 10-325 MG PER TABLET    Take one tablet by mouth four times daily for pain. Not to exceed 3gm APAP from all sources/24hr   LACTULOSE (CHRONULAC) 10 GM/15ML SOLUTION    Take 30 mLs (20 g total) by mouth 2 (two) times daily as needed for moderate constipation.   METOPROLOL SUCCINATE (TOPROL-XL) 50 MG 24 HR TABLET    Take 1 tablet (50 mg total) by mouth daily. Take with or immediately following a meal.   MULTIPLE VITAMIN (MULTIVITAMIN WITH MINERALS) TABS TABLET    Take 2 tablets by mouth daily.   NITROGLYCERIN (NITROSTAT) 0.4 MG SL TABLET    Place 1 tablet (0.4 mg total) under the tongue every 5 (five) minutes as needed for chest pain.   NUTRITIONAL SUPPLEMENT LIQD    Take 60 mLs by mouth 2 (two) times daily. 2-CAL   OXYCODONE-ACETAMINOPHEN (ROXICET) 5-325 MG PER TABLET    Take 1 tablet by mouth every 4 (four) hours as needed for severe pain.   RISPERIDONE (RISPERDAL) 1 MG/ML ORAL SOLUTION    Take 0.3 mLs (0.3 mg total) by mouth 2 (two) times daily.   SENNA-DOCUSATE (SENOKOT-S) 8.6-50 MG PER TABLET    Take 1 tablet by mouth 2 (two) times daily.  Modified Medications   No medications on file  Discontinued Medications   No medications on file     SIGNIFICANT DIAGNOSTIC EXAMS   11-24-14: Myoview: 1. Fixed defects involving the apex, anterior lateral wall and the inferior wall. The apex and anterior lateral wall fixed defects aresuggestive for an infarct. No evidence for reversibility or ischemia. 2. Diffuse hypokinesia and minimal wall motion along the lateral wall. 3. Left ventricular ejection fraction is 35%. 4. High-risk stress test findings*.  04-16-15: 2-d echo: - Left ventricle: The cavity size was mildly dilated. Wall thickness was increased in a pattern of mild  LVH. Systolic function was moderately to severely reduced. The estimated ejection fraction was in the range of 30% to 35%. There is akinesis of the anteroseptal and apical myocardium. Features are consistent with a pseudonormal left ventricular filling pattern, with concomitant abnormal relaxation and increased filling pressure (grade 2 diastolic dysfunction). Doppler parameters are consistent with high ventricular filling pressure. - Mitral valve: Calcified annulus. There was mild regurgitation. - Left  atrium: The atrium was mildly dilated. - Pulmonary arteries: Systolic pressure was mildly increased.   04-20-15: chest x-ray: Mild congestive heart failure suspected. Small focal opacity right upper lung field favored to be due to atelectasis or early airspace disease secondary to edema. Suggest attention on follow-up.  05-16-15: left knee x-ray: Comminuted fracture involving the lower pole of the patella, with approximately 3 cm of separation at the fracture, and diffuse overlying soft tissue swelling. Associated moderate to large knee joint effusion noted.  05-16-15: left hip and pelvis x-ray: 1. No evidence of fracture or dislocation. 2. Calcified fibroids noted.      LABS REVIEWED:   11-19-14: wbc 10.1; hgb .6; hct 29.1; mcv 93.6; plt 272; glucose 260; bun 83; creat 6.65; k+6.7; na++128; alk phos 142; ast 60; lat 68; t pro 8.6; albumin 3.5 11-21-14: wbc 8.2; hgb 9.0; hct 27.7; mcv 97.2; plt 251; glucose 166; bun 45; creat 4.77; k+5.0; na++134; phos 5.2; albumin 3.2; hgb a1c 6.0 11-24-14: wbc 7.2; hgb 8.6; hct 27.2; mcv 96.5; plt 233; glucose 99; bun 40; creat 4.51; k+4.4; na++132; phos 6.0; albumin 3.0 01-08-15: hgb a1c 5.2  02-13-15; chol 165; ldl 106; trig 46; hdl 50 04-19-15: wbc 9.5; hgb 10.3; hct 31.9; mcv 94.4; plt 400 glucose 108; bun 22; creat 3.75; k+3.6; na++135; ast 38; alt 52; alk phos 183; albumin 3.1; tsh 1.839; hgb a1c 5.4 05-18-15: wbc 9.0; hgb 11.3; hct 36.0; mcv 95.0; plt 485;  glucose 91; bun 34; creat 5.34; k+5.1; na++134 July 2016: hgb a1c 5.5 08-21-15: wbc 7.8; hgb 9.9; hct 31.;1 mcv 89.6; plt 200; glucose 104; bun 44; creat 6.23; k+4.6; na++129; phos 4.7; albumin 3.3 08-23-15: wbc 6.0; hgb 9.6; hct 29.3; mcv 90.7; plt 206; glucose 97; bun 45; creat 6.04; k+4.5; na++131; phos 3.4; albumin 2.9        Review of Systems Unable to perform ROS: Other    Physical Exam Constitutional: She appears well-developed and well-nourished. No distress.  Eyes: Conjunctivae are normal.  Neck: Neck supple. No JVD present. No thyromegaly present.  Cardiovascular: Normal rate, regular rhythm and intact distal pulses.   Respiratory: Effort normal and breath sounds normal. No respiratory distress. She has no wheezes.  GI: Soft. Bowel sounds are normal. She exhibits no distension. There is no tenderness.  Musculoskeletal: She exhibits no edema.  Able to move all extremities  Has splint on left leg Is status post left transmetatarsal amputation Has right transmetatarsal amputation; without signs of infection present  Lymphadenopathy:    She has no cervical adenopathy.  Neurological: She is alert.  Skin: Skin is warm and dry. She is not diaphoretic.  Left upper a/v fistula: +thril +bruit      ASSESSMENT/ PLAN:  1. ESRD 2. Vtach 3. Ischemic cardiomyopathy 4. FTT  Will stop all medications except for the following: plavix; amiodarone; toprol xl; phoslo; risperdal  Will continue to monitor her status.     Time spent with patient  45   minutes >50% time spent counseling; reviewing medical record; tests; labs; and developing future plan of care   Ok Edwards NP [facility]  Contact [phone] Monday through Friday 8am- 5pm  After hours call [phone]

## 2015-10-20 DIAGNOSIS — D631 Anemia in chronic kidney disease: Secondary | ICD-10-CM | POA: Diagnosis not present

## 2015-10-20 DIAGNOSIS — E1129 Type 2 diabetes mellitus with other diabetic kidney complication: Secondary | ICD-10-CM | POA: Diagnosis not present

## 2015-10-20 DIAGNOSIS — N186 End stage renal disease: Secondary | ICD-10-CM | POA: Diagnosis not present

## 2015-10-20 DIAGNOSIS — N2581 Secondary hyperparathyroidism of renal origin: Secondary | ICD-10-CM | POA: Diagnosis not present

## 2015-10-20 DIAGNOSIS — D509 Iron deficiency anemia, unspecified: Secondary | ICD-10-CM | POA: Diagnosis not present

## 2015-10-22 ENCOUNTER — Encounter: Payer: Self-pay | Admitting: Adult Health

## 2015-10-22 DIAGNOSIS — D509 Iron deficiency anemia, unspecified: Secondary | ICD-10-CM | POA: Diagnosis not present

## 2015-10-22 DIAGNOSIS — N186 End stage renal disease: Secondary | ICD-10-CM | POA: Diagnosis not present

## 2015-10-22 DIAGNOSIS — N2581 Secondary hyperparathyroidism of renal origin: Secondary | ICD-10-CM | POA: Diagnosis not present

## 2015-10-22 DIAGNOSIS — E1129 Type 2 diabetes mellitus with other diabetic kidney complication: Secondary | ICD-10-CM | POA: Diagnosis not present

## 2015-10-22 DIAGNOSIS — D631 Anemia in chronic kidney disease: Secondary | ICD-10-CM | POA: Diagnosis not present

## 2015-10-22 NOTE — Progress Notes (Signed)
Patient ID: [name], female   DOB: [birth year], 59 y.o.   MRN: [id number]    Facility: [facility]      No Known Allergies  Chief Complaint  Patient presents with  . Medical Management of Chronic Issues    HPI:  She is a [place] term resident of this facility being seen for the management of her chronic illnesses.  She is presently stable; she is currently taking her medications since her medication regimen was reduced. There are no nursing concerns at this time. She is unable to fully participate in the hpi or ros.    Past Medical History  Diagnosis Date  . Hyperlipidemia   . Hypertension     a. Has previously refused blood pressure meds.   . Deaf     USE SIGN INTERPRETER.  Divorced from husband but lives with him. Has daughter but she does not care for her.  . Bilateral leg edema     a. Chronic.  Marland Kitchen Psychiatric disorder     She frequently exhibits paranoia and has been diagnosed with psychotic d/o NOS during hospital stay in the past. She is tangetial and perseverative during her visits. She apparently has had a bad experience with mental health in [place] in the past and refuses to discuss mental issues for fear that she will be sent back there. Her paranoia, communication issues, financial woes and lack of fam  . Fibroid uterus   . Anemia     Due to fibroids.  . Heart murmur   . Diabetes mellitus     a. Per PCP note 2012 (A1C 9.2) - pt unwilling to take meds and was educated on risk of uncontrolled DM.  b. A1C 5.9 in 11/2013.  Marland Kitchen Coronary artery disease   . Blind   . ESRD on hemodialysis ([place])     Mon, Wed, Fri  . CHF (congestive heart failure) ([place])   . MI (myocardial infarction) ([place]) 11/2014  . Essential hypertension 08/09/2009    Qualifier: Diagnosis of  By: Sarita Haver  MD, Coralyn Mark    . Osteomyelitis [facility])     Past Surgical History  Procedure Laterality Date  . No past surgeries    . Insertion of dialysis catheter Right 01/02/2014    Procedure: INSERTION OF  DIALYSIS CATHETER;  Surgeon: Rosetta Posner, MD;  Location: [place];  Service: Vascular;  Laterality: Right;  . Av fistula placement Left 01/02/2014    Procedure: ARTERIOVENOUS (AV) FISTULA CREATION;  Surgeon: Rosetta Posner, MD;  Location: [place];  Service: Vascular;  Laterality: Left;  . Amputation Bilateral 04/02/2015    Procedure: Amputation Bilateral Great Toes MTP Joint;  Surgeon: Newt Minion, MD;  Location: [place];  Service: Orthopedics;  Laterality: Bilateral;  . Amputation Left 05/14/2015    Procedure: Left Transmetatarsal Amputation;  Surgeon: Newt Minion, MD;  Location: [place];  Service: Orthopedics;  Laterality: Left;  . Orif patella Left 05/17/2015    Procedure: OPEN REDUCTION INTERNAL (ORIF) FIXATION PATELLA;  Surgeon: Newt Minion, MD;  Location: [place];  Service: Orthopedics;  Laterality: Left;  . Amputation Right 08/20/2015    Procedure: Transmetatarsal Amputation Right Foot;  Surgeon: Newt Minion, MD;  Location: [place];  Service: Orthopedics;  Laterality: Right;    VITAL SIGNS BP 110/86 mmHg  Pulse 72  Ht _0  (1.727 m)  Wt 160 lb (72.576 kg)  BMI 24.33 kg/m2  Patient's Medications  New Prescriptions   No medications on  file  Previous Medications   AMIODARONE (PACERONE) 200 MG TABLET    Take 200 mg by mouth daily.   CALCIUM ACETATE (PHOSLO) 667 MG CAPSULE    Take 1 capsule (667 mg total) by mouth 3 (three) times daily with meals.   CLOPIDOGREL (PLAVIX) 75 MG TABLET    Take 1 tablet (75 mg total) by mouth daily.   METOPROLOL SUCCINATE (TOPROL-XL) 50 MG 24 HR TABLET    Take 1 tablet (50 mg total) by mouth daily. Take with or immediately following a meal.   NITROGLYCERIN (NITROSTAT) 0.4 MG SL TABLET    Place 0.4 mg under the tongue every 5 (five) minutes as needed for chest pain.   RISPERIDONE (RISPERDAL) 0.25 MG TABLET    Take 1 tablet (0.25 mg total) by mouth at bedtime.   SENNA (SENOKOT) 8.6 MG TABS TABLET    Take 1 tablet by mouth 2 (two) times daily as needed for mild  constipation.  Modified Medications   No medications on file  Discontinued Medications   No medications on file     SIGNIFICANT DIAGNOSTIC EXAMS  11-24-14: Myoview: 1. Fixed defects involving the apex, anterior lateral wall and the inferior wall. The apex and anterior lateral wall fixed defects aresuggestive for an infarct. No evidence for reversibility or ischemia. 2. Diffuse hypokinesia and minimal wall motion along the lateral wall. 3. Left ventricular ejection fraction is 35%. 4. High-risk stress test findings*.  04-16-15: 2-d echo: - Left ventricle: The cavity size was mildly dilated. Wall thickness was increased in a pattern of mild LVH. Systolic function was moderately to severely reduced. The estimated ejection fraction was in the range of 30% to 35%. There is akinesis of the anteroseptal and apical myocardium. Features are consistent with a pseudonormal left ventricular filling pattern, with concomitant abnormal relaxation and increased filling pressure (grade 2 diastolic dysfunction). Doppler parameters are consistent with high ventricular filling pressure. - Mitral valve: Calcified annulus. There was mild regurgitation. - Left atrium: The atrium was mildly dilated. - Pulmonary arteries: Systolic pressure was mildly increased.   04-20-15: chest x-ray: Mild congestive heart failure suspected. Small focal opacity right upper lung field favored to be due to atelectasis or early airspace disease secondary to edema. Suggest attention on follow-up.  05-16-15: left knee x-ray: Comminuted fracture involving the lower pole of the patella, with approximately 3 cm of separation at the fracture, and diffuse overlying soft tissue swelling. Associated moderate to large knee joint effusion noted.  05-16-15: left hip and pelvis x-ray: 1. No evidence of fracture or dislocation. 2. Calcified fibroids noted.      LABS REVIEWED:   11-19-14: wbc 10.1; hgb .6; hct 29.1; mcv 93.6; plt 272; glucose  260; bun 83; creat 6.65; k+6.7; na++128; alk phos 142; ast 60; lat 68; t pro 8.6; albumin 3.5 11-21-14: wbc 8.2; hgb 9.0; hct 27.7; mcv 97.2; plt 251; glucose 166; bun 45; creat 4.77; k+5.0; na++134; phos 5.2; albumin 3.2; hgb a1c 6.0 11-24-14: wbc 7.2; hgb 8.6; hct 27.2; mcv 96.5; plt 233; glucose 99; bun 40; creat 4.51; k+4.4; na++132; phos 6.0; albumin 3.0 01-08-15: hgb a1c 5.2  02-13-15; chol 165; ldl 106; trig 46; hdl 50 04-19-15: wbc 9.5; hgb 10.3; hct 31.9; mcv 94.4; plt 400 glucose 108; bun 22; creat 3.75; k+3.6; na++135; ast 38; alt 52; alk phos 183; albumin 3.1; tsh 1.839; hgb a1c 5.4 05-18-15: wbc 9.0; hgb 11.3; hct 36.0; mcv 95.0; plt 485; glucose 91; bun 34; creat 5.34; k+5.1; na++134 July  2016: hgb a1c 5.5 08-21-15: wbc 7.8; hgb 9.9; hct 31.;1 mcv 89.6; plt 200; glucose 104; bun 44; creat 6.23; k+4.6; na++129; phos 4.7; albumin 3.3 08-23-15: wbc 6.0; hgb 9.6; hct 29.3; mcv 90.7; plt 206; glucose 97; bun 45; creat 6.04; k+4.5; na++131; phos 3.4; albumin 2.9         Review of Systems Unable to perform ROS: Other      Physical Exam Constitutional: She appears well-developed and well-nourished. No distress.  Eyes: Conjunctivae are normal.  Neck: Neck supple. No JVD present. No thyromegaly present.  Cardiovascular: Normal rate, regular rhythm and intact distal pulses.   Respiratory: Effort normal and breath sounds normal. No respiratory distress. She has no wheezes.  GI: Soft. Bowel sounds are normal. She exhibits no distension. There is no tenderness.  Musculoskeletal: She exhibits no edema.  Able to move all extremities  Has splint on left leg Is status post left transmetatarsal amputation Has right transmetatarsal amputation; without signs of infection present  Lymphadenopathy:    She has no cervical adenopathy.  Neurological: She is alert.  Skin: Skin is warm and dry. She is not diaphoretic.  Left upper a/v fistula: +thril +bruit       ASSESSMENT/ PLAN:  1. ESRD on  hemodialysis: is followed by nephrology. Will continue dialysis three times weekly. Will continue phoslo 667 mg three times daily is on 1200 cc fluid restriction will monitor  2. Hypertension: is presently stable will continue toprol xl 50 mg daily   3. PAD: is without change is status post left and right transmetatarsal amputation: will continue plavix 75 mg daily   4. Combine systolic and diastolic heart failure: EF is 30-35%; is presently stable will continue toprol xl 50 mg daily   5. Sustained V tach: not a candidate for ICD. Is on amiodarone 200 mg daily for heart rate control will monitor   6. FTT: her current weight is 160 pounds; her albumin is 2.9. Will continue supplements per facility protocol.        Ok Edwards NP [facility]  Contact [phone] Monday through Friday 8am- 5pm  After hours call [phone]

## 2015-10-25 ENCOUNTER — Encounter (HOSPITAL_BASED_OUTPATIENT_CLINIC_OR_DEPARTMENT_OTHER): Payer: Medicare Other | Attending: Plastic Surgery

## 2015-10-25 DIAGNOSIS — D631 Anemia in chronic kidney disease: Secondary | ICD-10-CM | POA: Diagnosis not present

## 2015-10-25 DIAGNOSIS — N2581 Secondary hyperparathyroidism of renal origin: Secondary | ICD-10-CM | POA: Diagnosis not present

## 2015-10-25 DIAGNOSIS — E785 Hyperlipidemia, unspecified: Secondary | ICD-10-CM | POA: Diagnosis not present

## 2015-10-25 DIAGNOSIS — F209 Schizophrenia, unspecified: Secondary | ICD-10-CM | POA: Insufficient documentation

## 2015-10-25 DIAGNOSIS — Z992 Dependence on renal dialysis: Secondary | ICD-10-CM | POA: Diagnosis not present

## 2015-10-25 DIAGNOSIS — E11621 Type 2 diabetes mellitus with foot ulcer: Secondary | ICD-10-CM | POA: Insufficient documentation

## 2015-10-25 DIAGNOSIS — N186 End stage renal disease: Secondary | ICD-10-CM | POA: Insufficient documentation

## 2015-10-25 DIAGNOSIS — H538 Other visual disturbances: Secondary | ICD-10-CM | POA: Diagnosis not present

## 2015-10-25 DIAGNOSIS — I509 Heart failure, unspecified: Secondary | ICD-10-CM | POA: Diagnosis not present

## 2015-10-25 DIAGNOSIS — Z89432 Acquired absence of left foot: Secondary | ICD-10-CM | POA: Diagnosis not present

## 2015-10-25 DIAGNOSIS — D509 Iron deficiency anemia, unspecified: Secondary | ICD-10-CM | POA: Diagnosis not present

## 2015-10-25 DIAGNOSIS — I10 Essential (primary) hypertension: Secondary | ICD-10-CM | POA: Diagnosis not present

## 2015-10-25 DIAGNOSIS — I252 Old myocardial infarction: Secondary | ICD-10-CM | POA: Insufficient documentation

## 2015-10-25 DIAGNOSIS — I739 Peripheral vascular disease, unspecified: Secondary | ICD-10-CM | POA: Diagnosis not present

## 2015-10-25 DIAGNOSIS — E1151 Type 2 diabetes mellitus with diabetic peripheral angiopathy without gangrene: Secondary | ICD-10-CM | POA: Insufficient documentation

## 2015-10-25 DIAGNOSIS — H913 Deaf nonspeaking, not elsewhere classified: Secondary | ICD-10-CM | POA: Insufficient documentation

## 2015-10-25 DIAGNOSIS — I120 Hypertensive chronic kidney disease with stage 5 chronic kidney disease or end stage renal disease: Secondary | ICD-10-CM | POA: Diagnosis not present

## 2015-10-25 DIAGNOSIS — L97512 Non-pressure chronic ulcer of other part of right foot with fat layer exposed: Secondary | ICD-10-CM | POA: Insufficient documentation

## 2015-10-25 DIAGNOSIS — Z89431 Acquired absence of right foot: Secondary | ICD-10-CM | POA: Diagnosis not present

## 2015-10-25 DIAGNOSIS — E1129 Type 2 diabetes mellitus with other diabetic kidney complication: Secondary | ICD-10-CM | POA: Diagnosis not present

## 2015-10-27 DIAGNOSIS — N2581 Secondary hyperparathyroidism of renal origin: Secondary | ICD-10-CM | POA: Diagnosis not present

## 2015-10-27 DIAGNOSIS — E1129 Type 2 diabetes mellitus with other diabetic kidney complication: Secondary | ICD-10-CM | POA: Diagnosis not present

## 2015-10-27 DIAGNOSIS — D509 Iron deficiency anemia, unspecified: Secondary | ICD-10-CM | POA: Diagnosis not present

## 2015-10-27 DIAGNOSIS — N186 End stage renal disease: Secondary | ICD-10-CM | POA: Diagnosis not present

## 2015-10-29 DIAGNOSIS — N186 End stage renal disease: Secondary | ICD-10-CM | POA: Diagnosis not present

## 2015-10-29 DIAGNOSIS — D509 Iron deficiency anemia, unspecified: Secondary | ICD-10-CM | POA: Diagnosis not present

## 2015-10-29 DIAGNOSIS — E1129 Type 2 diabetes mellitus with other diabetic kidney complication: Secondary | ICD-10-CM | POA: Diagnosis not present

## 2015-10-29 DIAGNOSIS — N2581 Secondary hyperparathyroidism of renal origin: Secondary | ICD-10-CM | POA: Diagnosis not present

## 2015-11-01 DIAGNOSIS — N186 End stage renal disease: Secondary | ICD-10-CM | POA: Diagnosis not present

## 2015-11-01 DIAGNOSIS — E1129 Type 2 diabetes mellitus with other diabetic kidney complication: Secondary | ICD-10-CM | POA: Diagnosis not present

## 2015-11-01 DIAGNOSIS — N2581 Secondary hyperparathyroidism of renal origin: Secondary | ICD-10-CM | POA: Diagnosis not present

## 2015-11-01 DIAGNOSIS — D509 Iron deficiency anemia, unspecified: Secondary | ICD-10-CM | POA: Diagnosis not present

## 2015-11-02 ENCOUNTER — Encounter: Payer: Self-pay | Admitting: Adult Health

## 2015-11-02 ENCOUNTER — Non-Acute Institutional Stay (SKILLED_NURSING_FACILITY): Payer: Medicare Other | Admitting: Adult Health

## 2015-11-02 DIAGNOSIS — Z9114 Patient's other noncompliance with medication regimen: Secondary | ICD-10-CM

## 2015-11-02 DIAGNOSIS — D631 Anemia in chronic kidney disease: Secondary | ICD-10-CM

## 2015-11-02 DIAGNOSIS — I739 Peripheral vascular disease, unspecified: Secondary | ICD-10-CM

## 2015-11-02 DIAGNOSIS — N189 Chronic kidney disease, unspecified: Secondary | ICD-10-CM | POA: Diagnosis not present

## 2015-11-02 DIAGNOSIS — R627 Adult failure to thrive: Secondary | ICD-10-CM

## 2015-11-02 DIAGNOSIS — I5032 Chronic diastolic (congestive) heart failure: Secondary | ICD-10-CM | POA: Diagnosis not present

## 2015-11-02 DIAGNOSIS — I10 Essential (primary) hypertension: Secondary | ICD-10-CM | POA: Diagnosis not present

## 2015-11-02 DIAGNOSIS — F22 Delusional disorders: Secondary | ICD-10-CM

## 2015-11-02 NOTE — Progress Notes (Signed)
Patient ID: [name], female   DOB: [birth year], 59 y.o.   MRN: [id number]   Facility: [facility]      No Known Allergies  Chief Complaint  Patient presents with  . Medical Management of Chronic Issues    HPI:  She is a Coate term resident of this facility being seen for the management of her chronic illnesses. Overall there is little change in her status. She is unable to fully participate in the hpi or ros. There are no nursing concerns at this time.    Past Medical History  Diagnosis Date  . Hyperlipidemia   . Hypertension     a. Has previously refused blood pressure meds.   . Deaf     USE SIGN INTERPRETER.  Divorced from husband but lives with him. Has daughter but she does not care for her.  . Bilateral leg edema     a. Chronic.  Marland Kitchen Psychiatric disorder     She frequently exhibits paranoia and has been diagnosed with psychotic d/o NOS during hospital stay in the past. She is tangetial and perseverative during her visits. She apparently has had a bad experience with mental health in [place] in the past and refuses to discuss mental issues for fear that she will be sent back there. Her paranoia, communication issues, financial woes and lack of fam  . Fibroid uterus   . Anemia     Due to fibroids.  . Heart murmur   . Diabetes mellitus     a. Per PCP note 2012 (A1C 9.2) - pt unwilling to take meds and was educated on risk of uncontrolled DM.  b. A1C 5.9 in 11/2013.  Marland Kitchen Coronary artery disease   . Blind   . ESRD on hemodialysis ([place])     Mon, Wed, Fri  . CHF (congestive heart failure) ([place])   . MI (myocardial infarction) ([place]) 11/2014  . Essential hypertension 08/09/2009    Qualifier: Diagnosis of  By: Sarita Haver  MD, Coralyn Mark    . Osteomyelitis [facility])     Past Surgical History  Procedure Laterality Date  . No past surgeries    . Insertion of dialysis catheter Right 01/02/2014    Procedure: INSERTION OF DIALYSIS CATHETER;  Surgeon: Rosetta Posner, MD;  Location: [place];  Service:  Vascular;  Laterality: Right;  . Av fistula placement Left 01/02/2014    Procedure: ARTERIOVENOUS (AV) FISTULA CREATION;  Surgeon: Rosetta Posner, MD;  Location: [place];  Service: Vascular;  Laterality: Left;  . Amputation Bilateral 04/02/2015    Procedure: Amputation Bilateral Great Toes MTP Joint;  Surgeon: Newt Minion, MD;  Location: [place];  Service: Orthopedics;  Laterality: Bilateral;  . Amputation Left 05/14/2015    Procedure: Left Transmetatarsal Amputation;  Surgeon: Newt Minion, MD;  Location: [place];  Service: Orthopedics;  Laterality: Left;  . Orif patella Left 05/17/2015    Procedure: OPEN REDUCTION INTERNAL (ORIF) FIXATION PATELLA;  Surgeon: Newt Minion, MD;  Location: [place];  Service: Orthopedics;  Laterality: Left;  . Amputation Right 08/20/2015    Procedure: Transmetatarsal Amputation Right Foot;  Surgeon: Newt Minion, MD;  Location: [place];  Service: Orthopedics;  Laterality: Right;    VITAL SIGNS BP 117/81 mmHg  Pulse 80  Ht 5' 8"  (1.727 m)  Wt 160 lb (72.576 kg)  BMI 24.33 kg/m2  SpO2 97%  Patient's Medications  New Prescriptions   No medications on file  Previous Medications   AMIODARONE (PACERONE) 200  MG TABLET    Take 200 mg by mouth daily.   CALCIUM ACETATE (PHOSLO) 667 MG CAPSULE Take 1 capsule daily    CLOPIDOGREL (PLAVIX) 75 MG TABLET    Take 1 tablet (75 mg total) by mouth daily.   METOPROLOL SUCCINATE (TOPROL-XL) 50 MG 24 HR TABLET    Take 1 tablet (50 mg total) by mouth daily. Take with or immediately following a meal.   NITROGLYCERIN (NITROSTAT) 0.4 MG SL TABLET    Place 0.4 mg under the tongue every 5 (five) minutes as needed for chest pain.   SENNA (SENOKOT) 8.6 MG TABS TABLET    Take 1 tablet by mouth 2 (two) times daily as needed for mild constipation.  Modified Medications   No medications on file  Discontinued Medications     SIGNIFICANT DIAGNOSTIC EXAMS   11-24-14: Myoview: 1. Fixed defects involving the apex, anterior lateral wall and the  inferior wall. The apex and anterior lateral wall fixed defects aresuggestive for an infarct. No evidence for reversibility or ischemia. 2. Diffuse hypokinesia and minimal wall motion along the lateral wall. 3. Left ventricular ejection fraction is 35%. 4. High-risk stress test findings*.  04-16-15: 2-d echo: - Left ventricle: The cavity size was mildly dilated. Wall thickness was increased in a pattern of mild LVH. Systolic function was moderately to severely reduced. The estimated ejection fraction was in the range of 30% to 35%. There is akinesis of the anteroseptal and apical myocardium. Features are consistent with a pseudonormal left ventricular filling pattern, with concomitant abnormal relaxation and increased filling pressure (grade 2 diastolic dysfunction). Doppler parameters are consistent with high ventricular filling pressure. - Mitral valve: Calcified annulus. There was mild regurgitation. - Left atrium: The atrium was mildly dilated. - Pulmonary arteries: Systolic pressure was mildly increased.   04-20-15: chest x-ray: Mild congestive heart failure suspected. Small focal opacity right upper lung field favored to be due to atelectasis or early airspace disease secondary to edema. Suggest attention on follow-up.  05-16-15: left knee x-ray: Comminuted fracture involving the lower pole of the patella, with approximately 3 cm of separation at the fracture, and diffuse overlying soft tissue swelling. Associated moderate to large knee joint effusion noted.  05-16-15: left hip and pelvis x-ray: 1. No evidence of fracture or dislocation. 2. Calcified fibroids noted.      LABS REVIEWED:   11-19-14: wbc 10.1; hgb .6; hct 29.1; mcv 93.6; plt 272; glucose 260; bun 83; creat 6.65; k+6.7; na++128; alk phos 142; ast 60; lat 68; t pro 8.6; albumin 3.5 11-21-14: wbc 8.2; hgb 9.0; hct 27.7; mcv 97.2; plt 251; glucose 166; bun 45; creat 4.77; k+5.0; na++134; phos 5.2; albumin 3.2; hgb a1c  6.0 11-24-14: wbc 7.2; hgb 8.6; hct 27.2; mcv 96.5; plt 233; glucose 99; bun 40; creat 4.51; k+4.4; na++132; phos 6.0; albumin 3.0 01-08-15: hgb a1c 5.2  02-13-15; chol 165; ldl 106; trig 46; hdl 50 04-19-15: wbc 9.5; hgb 10.3; hct 31.9; mcv 94.4; plt 400 glucose 108; bun 22; creat 3.75; k+3.6; na++135; ast 38; alt 52; alk phos 183; albumin 3.1; tsh 1.839; hgb a1c 5.4 05-18-15: wbc 9.0; hgb 11.3; hct 36.0; mcv 95.0; plt 485; glucose 91; bun 34; creat 5.34; k+5.1; na++134 July 2016: hgb a1c 5.5 08-21-15: wbc 7.8; hgb 9.9; hct 31.;1 mcv 89.6; plt 200; glucose 104; bun 44; creat 6.23; k+4.6; na++129; phos 4.7; albumin 3.3 08-23-15: wbc 6.0; hgb 9.6; hct 29.3; mcv 90.7; plt 206; glucose 97; bun 45; creat 6.04; k+4.5; na++131;  phos 3.4; albumin 2.9        Review of Systems Unable to perform ROS: Other    Physical Exam Constitutional: She appears well-developed and well-nourished. No distress.  Eyes: Conjunctivae are normal.  Neck: Neck supple. No JVD present. No thyromegaly present.  Cardiovascular: Normal rate, regular rhythm and intact distal pulses.  Murmur   Respiratory: Effort normal and breath sounds normal. No respiratory distress. She has no wheezes.  GI: Soft. Bowel sounds are normal. She exhibits no distension. There is no tenderness.  Musculoskeletal: She exhibits no edema.  Able to move all extremities  Has splint on left leg Is status post left transmetatarsal amputation Has right transmetatarsal amputation; Lymphadenopathy:    She has no cervical adenopathy.  Neurological: She is alert.  Skin: Skin is warm and dry. She is not diaphoretic.  Left upper a/v fistula: +thril +bruit Right trans-metarsal  Amputation: 3.9 x 1.7 x 0.14 cm 65 % yellow slough       ASSESSMENT/ PLAN:  1. ESRD on hemodialysis: is followed by nephrology. Will continue dialysis three times weekly. Will continue phoslo 667 mg s daily is on 1200 cc fluid restriction will monitor  2. Hypertension: is  presently stable will continue toprol xl 50 mg daily   3. PAD: is without change is status post left and right transmetatarsal amputation: will continue plavix 75 mg daily   4. Combine systolic and diastolic heart failure: EF is 30-35%; is presently stable will continue toprol xl 50 mg daily   5. Sustained V tach: not a candidate for ICD. Is on amiodarone 200 mg daily for heart rate control will monitor   6. FTT: her current weight is 160 pounds; her albumin is 2.9. Will continue supplements per facility protocol.   7. Psychosis: her risperdal was stopped due to her declining her medications. Will monitor    [name] Edwards NP [facility]  Contact [phone] Monday through Friday 8am- 5pm  After hours call [phone]

## 2015-11-03 DIAGNOSIS — E1129 Type 2 diabetes mellitus with other diabetic kidney complication: Secondary | ICD-10-CM | POA: Diagnosis not present

## 2015-11-03 DIAGNOSIS — D509 Iron deficiency anemia, unspecified: Secondary | ICD-10-CM | POA: Diagnosis not present

## 2015-11-03 DIAGNOSIS — N186 End stage renal disease: Secondary | ICD-10-CM | POA: Diagnosis not present

## 2015-11-03 DIAGNOSIS — N2581 Secondary hyperparathyroidism of renal origin: Secondary | ICD-10-CM | POA: Diagnosis not present

## 2015-11-05 DIAGNOSIS — N186 End stage renal disease: Secondary | ICD-10-CM | POA: Diagnosis not present

## 2015-11-05 DIAGNOSIS — E1129 Type 2 diabetes mellitus with other diabetic kidney complication: Secondary | ICD-10-CM | POA: Diagnosis not present

## 2015-11-05 DIAGNOSIS — D509 Iron deficiency anemia, unspecified: Secondary | ICD-10-CM | POA: Diagnosis not present

## 2015-11-05 DIAGNOSIS — N2581 Secondary hyperparathyroidism of renal origin: Secondary | ICD-10-CM | POA: Diagnosis not present

## 2015-11-08 DIAGNOSIS — D509 Iron deficiency anemia, unspecified: Secondary | ICD-10-CM | POA: Diagnosis not present

## 2015-11-08 DIAGNOSIS — N186 End stage renal disease: Secondary | ICD-10-CM | POA: Diagnosis not present

## 2015-11-08 DIAGNOSIS — N2581 Secondary hyperparathyroidism of renal origin: Secondary | ICD-10-CM | POA: Diagnosis not present

## 2015-11-08 DIAGNOSIS — E1129 Type 2 diabetes mellitus with other diabetic kidney complication: Secondary | ICD-10-CM | POA: Diagnosis not present

## 2015-11-10 DIAGNOSIS — E1129 Type 2 diabetes mellitus with other diabetic kidney complication: Secondary | ICD-10-CM | POA: Diagnosis not present

## 2015-11-10 DIAGNOSIS — N2581 Secondary hyperparathyroidism of renal origin: Secondary | ICD-10-CM | POA: Diagnosis not present

## 2015-11-10 DIAGNOSIS — N186 End stage renal disease: Secondary | ICD-10-CM | POA: Diagnosis not present

## 2015-11-10 DIAGNOSIS — D509 Iron deficiency anemia, unspecified: Secondary | ICD-10-CM | POA: Diagnosis not present

## 2015-11-12 DIAGNOSIS — E1129 Type 2 diabetes mellitus with other diabetic kidney complication: Secondary | ICD-10-CM | POA: Diagnosis not present

## 2015-11-12 DIAGNOSIS — N186 End stage renal disease: Secondary | ICD-10-CM | POA: Diagnosis not present

## 2015-11-12 DIAGNOSIS — D509 Iron deficiency anemia, unspecified: Secondary | ICD-10-CM | POA: Diagnosis not present

## 2015-11-12 DIAGNOSIS — N2581 Secondary hyperparathyroidism of renal origin: Secondary | ICD-10-CM | POA: Diagnosis not present

## 2015-11-15 DIAGNOSIS — D509 Iron deficiency anemia, unspecified: Secondary | ICD-10-CM | POA: Diagnosis not present

## 2015-11-15 DIAGNOSIS — N2581 Secondary hyperparathyroidism of renal origin: Secondary | ICD-10-CM | POA: Diagnosis not present

## 2015-11-15 DIAGNOSIS — E1129 Type 2 diabetes mellitus with other diabetic kidney complication: Secondary | ICD-10-CM | POA: Diagnosis not present

## 2015-11-15 DIAGNOSIS — N186 End stage renal disease: Secondary | ICD-10-CM | POA: Diagnosis not present

## 2015-11-17 DIAGNOSIS — N186 End stage renal disease: Secondary | ICD-10-CM | POA: Diagnosis not present

## 2015-11-17 DIAGNOSIS — D509 Iron deficiency anemia, unspecified: Secondary | ICD-10-CM | POA: Diagnosis not present

## 2015-11-17 DIAGNOSIS — E1129 Type 2 diabetes mellitus with other diabetic kidney complication: Secondary | ICD-10-CM | POA: Diagnosis not present

## 2015-11-17 DIAGNOSIS — N2581 Secondary hyperparathyroidism of renal origin: Secondary | ICD-10-CM | POA: Diagnosis not present

## 2015-11-20 DIAGNOSIS — N186 End stage renal disease: Secondary | ICD-10-CM | POA: Diagnosis not present

## 2015-11-20 DIAGNOSIS — N2581 Secondary hyperparathyroidism of renal origin: Secondary | ICD-10-CM | POA: Diagnosis not present

## 2015-11-20 DIAGNOSIS — D509 Iron deficiency anemia, unspecified: Secondary | ICD-10-CM | POA: Diagnosis not present

## 2015-11-20 DIAGNOSIS — E1129 Type 2 diabetes mellitus with other diabetic kidney complication: Secondary | ICD-10-CM | POA: Diagnosis not present

## 2015-11-22 ENCOUNTER — Encounter (HOSPITAL_BASED_OUTPATIENT_CLINIC_OR_DEPARTMENT_OTHER): Payer: Medicare Other | Attending: Plastic Surgery

## 2015-11-22 DIAGNOSIS — N2581 Secondary hyperparathyroidism of renal origin: Secondary | ICD-10-CM | POA: Diagnosis not present

## 2015-11-22 DIAGNOSIS — E1129 Type 2 diabetes mellitus with other diabetic kidney complication: Secondary | ICD-10-CM | POA: Diagnosis not present

## 2015-11-22 DIAGNOSIS — D509 Iron deficiency anemia, unspecified: Secondary | ICD-10-CM | POA: Diagnosis not present

## 2015-11-22 DIAGNOSIS — N186 End stage renal disease: Secondary | ICD-10-CM | POA: Diagnosis not present

## 2015-11-23 DIAGNOSIS — F209 Schizophrenia, unspecified: Secondary | ICD-10-CM | POA: Diagnosis not present

## 2015-11-23 DIAGNOSIS — F329 Major depressive disorder, single episode, unspecified: Secondary | ICD-10-CM | POA: Diagnosis not present

## 2015-11-24 DIAGNOSIS — E1129 Type 2 diabetes mellitus with other diabetic kidney complication: Secondary | ICD-10-CM | POA: Diagnosis not present

## 2015-11-24 DIAGNOSIS — D509 Iron deficiency anemia, unspecified: Secondary | ICD-10-CM | POA: Diagnosis not present

## 2015-11-24 DIAGNOSIS — N2581 Secondary hyperparathyroidism of renal origin: Secondary | ICD-10-CM | POA: Diagnosis not present

## 2015-11-24 DIAGNOSIS — Z992 Dependence on renal dialysis: Secondary | ICD-10-CM | POA: Diagnosis not present

## 2015-11-24 DIAGNOSIS — N186 End stage renal disease: Secondary | ICD-10-CM | POA: Diagnosis not present

## 2015-11-26 DIAGNOSIS — N2581 Secondary hyperparathyroidism of renal origin: Secondary | ICD-10-CM | POA: Diagnosis not present

## 2015-11-26 DIAGNOSIS — N186 End stage renal disease: Secondary | ICD-10-CM | POA: Diagnosis not present

## 2015-11-26 DIAGNOSIS — E1129 Type 2 diabetes mellitus with other diabetic kidney complication: Secondary | ICD-10-CM | POA: Diagnosis not present

## 2015-11-30 ENCOUNTER — Non-Acute Institutional Stay (SKILLED_NURSING_FACILITY): Payer: Medicare Other | Admitting: Adult Health

## 2015-11-30 DIAGNOSIS — Z9114 Patient's other noncompliance with medication regimen: Secondary | ICD-10-CM

## 2015-11-30 DIAGNOSIS — I214 Non-ST elevation (NSTEMI) myocardial infarction: Secondary | ICD-10-CM

## 2015-11-30 DIAGNOSIS — R627 Adult failure to thrive: Secondary | ICD-10-CM | POA: Diagnosis not present

## 2015-11-30 DIAGNOSIS — I255 Ischemic cardiomyopathy: Secondary | ICD-10-CM | POA: Diagnosis not present

## 2015-11-30 DIAGNOSIS — E1122 Type 2 diabetes mellitus with diabetic chronic kidney disease: Secondary | ICD-10-CM

## 2015-11-30 DIAGNOSIS — I472 Ventricular tachycardia, unspecified: Secondary | ICD-10-CM

## 2015-11-30 DIAGNOSIS — F22 Delusional disorders: Secondary | ICD-10-CM

## 2015-11-30 DIAGNOSIS — H547 Unspecified visual loss: Secondary | ICD-10-CM

## 2015-11-30 DIAGNOSIS — H9190 Unspecified hearing loss, unspecified ear: Secondary | ICD-10-CM

## 2015-11-30 DIAGNOSIS — E1169 Type 2 diabetes mellitus with other specified complication: Secondary | ICD-10-CM | POA: Diagnosis not present

## 2015-11-30 DIAGNOSIS — I739 Peripheral vascular disease, unspecified: Secondary | ICD-10-CM

## 2015-11-30 DIAGNOSIS — H540 Blindness, both eyes: Secondary | ICD-10-CM

## 2015-11-30 DIAGNOSIS — Z91148 Patient's other noncompliance with medication regimen for other reason: Secondary | ICD-10-CM

## 2015-11-30 DIAGNOSIS — E785 Hyperlipidemia, unspecified: Secondary | ICD-10-CM

## 2015-11-30 DIAGNOSIS — I5042 Chronic combined systolic (congestive) and diastolic (congestive) heart failure: Secondary | ICD-10-CM

## 2015-11-30 DIAGNOSIS — N186 End stage renal disease: Secondary | ICD-10-CM | POA: Diagnosis not present

## 2015-11-30 DIAGNOSIS — Z7382 Dual sensory impairment: Secondary | ICD-10-CM

## 2015-12-01 DIAGNOSIS — E119 Type 2 diabetes mellitus without complications: Secondary | ICD-10-CM | POA: Diagnosis not present

## 2015-12-01 DIAGNOSIS — I10 Essential (primary) hypertension: Secondary | ICD-10-CM | POA: Diagnosis not present

## 2015-12-01 DIAGNOSIS — N189 Chronic kidney disease, unspecified: Secondary | ICD-10-CM | POA: Diagnosis not present

## 2015-12-06 ENCOUNTER — Encounter (HOSPITAL_BASED_OUTPATIENT_CLINIC_OR_DEPARTMENT_OTHER): Payer: Medicare Other | Attending: Plastic Surgery

## 2015-12-06 DIAGNOSIS — Z89431 Acquired absence of right foot: Secondary | ICD-10-CM | POA: Diagnosis not present

## 2015-12-06 DIAGNOSIS — E1122 Type 2 diabetes mellitus with diabetic chronic kidney disease: Secondary | ICD-10-CM | POA: Insufficient documentation

## 2015-12-06 DIAGNOSIS — L97511 Non-pressure chronic ulcer of other part of right foot limited to breakdown of skin: Secondary | ICD-10-CM | POA: Diagnosis not present

## 2015-12-06 DIAGNOSIS — H913 Deaf nonspeaking, not elsewhere classified: Secondary | ICD-10-CM | POA: Diagnosis not present

## 2015-12-06 DIAGNOSIS — N186 End stage renal disease: Secondary | ICD-10-CM | POA: Insufficient documentation

## 2015-12-06 DIAGNOSIS — I252 Old myocardial infarction: Secondary | ICD-10-CM | POA: Diagnosis not present

## 2015-12-06 DIAGNOSIS — I132 Hypertensive heart and chronic kidney disease with heart failure and with stage 5 chronic kidney disease, or end stage renal disease: Secondary | ICD-10-CM | POA: Diagnosis not present

## 2015-12-06 DIAGNOSIS — E11621 Type 2 diabetes mellitus with foot ulcer: Secondary | ICD-10-CM | POA: Insufficient documentation

## 2015-12-06 DIAGNOSIS — F209 Schizophrenia, unspecified: Secondary | ICD-10-CM | POA: Insufficient documentation

## 2015-12-06 DIAGNOSIS — I509 Heart failure, unspecified: Secondary | ICD-10-CM | POA: Insufficient documentation

## 2015-12-06 DIAGNOSIS — Z89432 Acquired absence of left foot: Secondary | ICD-10-CM | POA: Diagnosis not present

## 2015-12-06 DIAGNOSIS — E785 Hyperlipidemia, unspecified: Secondary | ICD-10-CM | POA: Diagnosis not present

## 2015-12-06 DIAGNOSIS — H538 Other visual disturbances: Secondary | ICD-10-CM | POA: Diagnosis not present

## 2015-12-06 DIAGNOSIS — E1140 Type 2 diabetes mellitus with diabetic neuropathy, unspecified: Secondary | ICD-10-CM | POA: Insufficient documentation

## 2015-12-15 ENCOUNTER — Encounter: Payer: Self-pay | Admitting: Adult Health

## 2015-12-15 DIAGNOSIS — I5042 Chronic combined systolic (congestive) and diastolic (congestive) heart failure: Secondary | ICD-10-CM | POA: Insufficient documentation

## 2015-12-15 DIAGNOSIS — I472 Ventricular tachycardia, unspecified: Secondary | ICD-10-CM | POA: Insufficient documentation

## 2015-12-15 DIAGNOSIS — E1169 Type 2 diabetes mellitus with other specified complication: Secondary | ICD-10-CM | POA: Insufficient documentation

## 2015-12-15 DIAGNOSIS — E785 Hyperlipidemia, unspecified: Secondary | ICD-10-CM

## 2015-12-15 NOTE — Progress Notes (Signed)
Patient ID: [name], female   DOB: [birth year], 59 y.o.   MRN: [id number]    Facility:  [facility]      No Known Allergies  Chief Complaint  Patient presents with  . Annual Exam    HPI:  She is a Males term resident of this facility being seen for her annual exam. She has bilateral transmetatarsal amputation this past year. Her spouse has died this past year. She is more paranoid and is not wanting to participate with dialysis any more. I have spoken with Social service regarding her desires. Wynetta will have to decline dialysis herself rather than myself or the facility stopping this therapy.    Past Medical History  Diagnosis Date  . Hyperlipidemia   . Hypertension     a. Has previously refused blood pressure meds.   . Deaf     USE SIGN INTERPRETER.  Divorced from husband but lives with him. Has daughter but she does not care for her.  . Bilateral leg edema     a. Chronic.  Marland Kitchen Psychiatric disorder     She frequently exhibits paranoia and has been diagnosed with psychotic d/o NOS during hospital stay in the past. She is tangetial and perseverative during her visits. She apparently has had a bad experience with mental health in [place] in the past and refuses to discuss mental issues for fear that she will be sent back there. Her paranoia, communication issues, financial woes and lack of fam  . Fibroid uterus   . Anemia     Due to fibroids.  . Heart murmur   . Diabetes mellitus     a. Per PCP note 2012 (A1C 9.2) - pt unwilling to take meds and was educated on risk of uncontrolled DM.  b. A1C 5.9 in 11/2013.  Marland Kitchen Coronary artery disease   . Blind   . ESRD on hemodialysis ([facility])     Mon, Wed, Fri  . CHF (congestive heart failure) ([facility])   . MI (myocardial infarction) ([facility]) 11/2014  . Essential hypertension 08/09/2009    Qualifier: Diagnosis of  By: Rexene Alberts  MD, Aurther Loft    . Osteomyelitis ([facility])   . Cardiac arrest ([facility]) 12/12/2014  . Pulmonary hypertension ([facility])   . Moderate  tricuspid regurgitation   . Leiomyoma of uterus 08/09/2009    Qualifier: Diagnosis of  By: Rexene Alberts  MD, Aurther Loft      Past Surgical History  Procedure Laterality Date  . No past surgeries    . Insertion of dialysis catheter Right 01/02/2014    Procedure: INSERTION OF DIALYSIS CATHETER;  Surgeon: Larina Earthly, MD;  Location: [facility] OR;  Service: Vascular;  Laterality: Right;  . Av fistula placement Left 01/02/2014    Procedure: ARTERIOVENOUS (AV) FISTULA CREATION;  Surgeon: Larina Earthly, MD;  Location: [facility] OR;  Service: Vascular;  Laterality: Left;  . Amputation Bilateral 04/02/2015    Procedure: Amputation Bilateral Great Toes MTP Joint;  Surgeon: Nadara Mustard, MD;  Location: [facility] OR;  Service: Orthopedics;  Laterality: Bilateral;  . Amputation Left 05/14/2015    Procedure: Left Transmetatarsal Amputation;  Surgeon: Nadara Mustard, MD;  Location: [facility] OR;  Service: Orthopedics;  Laterality: Left;  . Orif patella Left 05/17/2015    Procedure: OPEN REDUCTION INTERNAL (ORIF) FIXATION PATELLA;  Surgeon: Nadara Mustard, MD;  Location: [facility] OR;  Service: Orthopedics;  Laterality: Left;  . Amputation Right 08/20/2015    Procedure: Transmetatarsal Amputation Right Foot;  Surgeon: Nadara Mustard,  MD;  Location: [place];  Service: Orthopedics;  Laterality: Right;   Family History  Problem Relation Age of Onset  . Other Father     Drowned from fishing?  . Heart disease     Social History   Social History  . Marital Status: Widowed    Spouse Name: N/A  . Number of Children: N/A  . Years of Education: N/A   Occupational History  . Not on file.   Social History Main Topics  . Smoking status: Never Smoker   . Smokeless tobacco: Not on file     Comment: Prior dip  . Alcohol Use: No  . Drug Use: No  . Sexual Activity: Not on file   Other Topics Concern  . Not on file   Social History Narrative     VITAL SIGNS BP 108/66 mmHg  Pulse 74  Ht '5\' 8"'$  (1.727 m)  Wt 152 lb (68.947 kg)  BMI 23.12 kg/m2  SpO2  98%  Patient's Medications  New Prescriptions   No medications on file  Previous Medications   AMIODARONE (PACERONE) 200 MG TABLET    Take 200 mg by mouth daily.   CALCIUM ACETATE (PHOSLO) 667 MG CAPSULE    Take 1 capsule (667 mg total) by mouth 3 (three) times daily with meals.   CLOPIDOGREL (PLAVIX) 75 MG TABLET    Take 1 tablet (75 mg total) by mouth daily.   METOPROLOL SUCCINATE (TOPROL-XL) 50 MG 24 HR TABLET    Take 1 tablet (50 mg total) by mouth daily. Take with or immediately following a meal.   NITROGLYCERIN (NITROSTAT) 0.4 MG SL TABLET    Place 0.4 mg under the tongue every 5 (five) minutes as needed for chest pain.   SENNA (SENOKOT) 8.6 MG TABS TABLET    Take 1 tablet by mouth 2 (two) times daily as needed for mild constipation.  Modified Medications   No medications on file  Discontinued Medications   No medications on file     SIGNIFICANT DIAGNOSTIC EXAMS   11-24-14: Myoview: 1. Fixed defects involving the apex, anterior lateral wall and the inferior wall. The apex and anterior lateral wall fixed defects aresuggestive for an infarct. No evidence for reversibility or ischemia. 2. Diffuse hypokinesia and minimal wall motion along the lateral wall. 3. Left ventricular ejection fraction is 35%. 4. High-risk stress test findings*.  04-16-15: 2-d echo: - Left ventricle: The cavity size was mildly dilated. Wall thickness was increased in a pattern of mild LVH. Systolic function was moderately to severely reduced. The estimated ejection fraction was in the range of 30% to 35%. There is akinesis of the anteroseptal and apical myocardium. Features are consistent with a pseudonormal left ventricular filling pattern, with concomitant abnormal relaxation and increased filling pressure (grade 2 diastolic dysfunction). Doppler parameters are consistent with high ventricular filling pressure. - Mitral valve: Calcified annulus. There was mild regurgitation. - Left atrium: The atrium was  mildly dilated. - Pulmonary arteries: Systolic pressure was mildly increased.   04-20-15: chest x-ray: Mild congestive heart failure suspected. Small focal opacity right upper lung field favored to be due to atelectasis or early airspace disease secondary to edema. Suggest attention on follow-up.  05-16-15: left knee x-ray: Comminuted fracture involving the lower pole of the patella, with approximately 3 cm of separation at the fracture, and diffuse overlying soft tissue swelling. Associated moderate to large knee joint effusion noted.  05-16-15: left hip and pelvis x-ray: 1. No evidence of fracture or dislocation.  2. Calcified fibroids noted.      LABS REVIEWED:   11-19-14: wbc 10.1; hgb .6; hct 29.1; mcv 93.6; plt 272; glucose 260; bun 83; creat 6.65; k+6.7; na++128; alk phos 142; ast 60; lat 68; t pro 8.6; albumin 3.5 11-21-14: wbc 8.2; hgb 9.0; hct 27.7; mcv 97.2; plt 251; glucose 166; bun 45; creat 4.77; k+5.0; na++134; phos 5.2; albumin 3.2; hgb a1c 6.0 11-24-14: wbc 7.2; hgb 8.6; hct 27.2; mcv 96.5; plt 233; glucose 99; bun 40; creat 4.51; k+4.4; na++132; phos 6.0; albumin 3.0 01-08-15: hgb a1c 5.2  02-13-15; chol 165; ldl 106; trig 46; hdl 50 04-19-15: wbc 9.5; hgb 10.3; hct 31.9; mcv 94.4; plt 400 glucose 108; bun 22; creat 3.75; k+3.6; na++135; ast 38; alt 52; alk phos 183; albumin 3.1; tsh 1.839; hgb a1c 5.4 05-18-15: wbc 9.0; hgb 11.3; hct 36.0; mcv 95.0; plt 485; glucose 91; bun 34; creat 5.34; k+5.1; na++134 July 2016: hgb a1c 5.5 08-21-15: wbc 7.8; hgb 9.9; hct 31.;1 mcv 89.6; plt 200; glucose 104; bun 44; creat 6.23; k+4.6; na++129; phos 4.7; albumin 3.3 08-23-15: wbc 6.0; hgb 9.6; hct 29.3; mcv 90.7; plt 206; glucose 97; bun 45; creat 6.04; k+4.5; na++131; phos 3.4; albumin 2.9        Review of Systems Unable to perform ROS: Other    Physical Exam Constitutional: She appears well-developed and well-nourished. No distress.  Eyes: Conjunctivae are normal.  Neck: Neck supple.  No JVD present. No thyromegaly present.  Cardiovascular: Normal rate, regular rhythm and intact distal pulses.  Murmur   Respiratory: Effort normal and breath sounds normal. No respiratory distress. She has no wheezes.  GI: Soft. Bowel sounds are normal. She exhibits no distension. There is no tenderness.  Musculoskeletal: She exhibits no edema.  Able to move all extremities  Has splint on left leg Is status post left transmetatarsal amputation Has right transmetatarsal amputation; Lymphadenopathy:    She has no cervical adenopathy.  Neurological: She is alert.  Skin: Skin is warm and dry. She is not diaphoretic.  Left upper a/v fistula: +thril +bruit Right trans-metarsal  Amputation: 1.5 x 0.4 cm scant drainage; will continue santyl and calcium alginate       ASSESSMENT/ PLAN:  1. ESRD on hemodialysis: is followed by nephrology. Will continue dialysis three times weekly. Will continue phoslo 667 mg  daily is on 1200 cc fluid restriction will monitor  2. Hypertension: is presently stable will continue toprol xl 50 mg daily   3. PAD: is without change is status post left and right transmetatarsal amputation: will continue plavix 75 mg daily   4. Combine systolic and diastolic heart failure: EF is 30-35%; is presently stable will continue toprol xl 50 mg daily   5. Sustained V tach: not a candidate for ICD. Is on amiodarone 200 mg daily for heart rate control will monitor   6. FTT: her current weight is 152 pounds; her albumin is 2.9. Will continue supplements per facility protocol.   7. Psychosis; paranoid : her risperdal was stopped due to her declining her medications. Will monitor   8. PAD: is status post left transmetatarsal amputation and on the right in August 2016; will continue right foot wound per current plan of care.    Time spent with patient  40  minutes >50% time spent counseling; reviewing medical record; tests; labs; and developing future plan of  care    Ok Edwards NP [facility]  Contact [phone] Monday through Friday 8am- 5pm  After hours call [phone]

## 2015-12-23 ENCOUNTER — Encounter: Payer: Self-pay | Admitting: Internal Medicine

## 2015-12-23 ENCOUNTER — Non-Acute Institutional Stay (SKILLED_NURSING_FACILITY): Payer: Medicare Other | Admitting: Internal Medicine

## 2015-12-23 DIAGNOSIS — Z9114 Patient's other noncompliance with medication regimen: Secondary | ICD-10-CM | POA: Diagnosis not present

## 2015-12-23 DIAGNOSIS — K5901 Slow transit constipation: Secondary | ICD-10-CM

## 2015-12-23 DIAGNOSIS — N186 End stage renal disease: Secondary | ICD-10-CM | POA: Diagnosis not present

## 2015-12-23 NOTE — Assessment & Plan Note (Signed)
Pt has missed 4 dialysis sessions and she is doing fine. She will go tomorrow 10 am. She is not SOB, her lungs are clear and she has trace pedal edema. I would like to have a BMP but before the results come back she will be at dialysis.

## 2015-12-23 NOTE — Progress Notes (Signed)
MRN: [id number] Name: [name]  Sex: female Age: 59 y.o. DOB: 04-24-1956  [place] #: Karren Burly Facility/Room: Level Of Care: SNF Provider: Inocencio Homes D Emergency Contacts: Extended Emergency Contact Information Primary Emergency Contact: Abello,[name] Address: [place], [place] [address] [place] Phone: [phone] Relation: Spouse Secondary Emergency Contact: Haynes Dage  [place] Phone: [phone] Mobile Phone: [phone] Relation: Sister  Code Status:   Allergies: Review of patient's allergies indicates no known allergies.  Chief Complaint  Patient presents with  . Acute Visit    HPI: Patient is 59 y.o. 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 I am meeting with her and an interpreter to discuss this decision.   Past Medical History  Diagnosis Date  . Hyperlipidemia   . Hypertension     a. Has previously refused blood pressure meds.   . Deaf     USE SIGN INTERPRETER.  Divorced from husband but lives with him. Has daughter but she does not care for her.  . Bilateral leg edema     a. Chronic.  Marland Kitchen Psychiatric disorder     She frequently exhibits paranoia and has been diagnosed with psychotic d/o NOS during hospital stay in the past. She is tangetial and perseverative during her visits. She apparently has had a bad experience with mental health in [place] in the past and refuses to discuss mental issues for fear that she will be sent back there. Her paranoia, communication issues, financial woes and lack of fam  . Fibroid uterus   . Anemia     Due to fibroids.  . Heart murmur   . Diabetes mellitus     a. Per PCP note 2012 (A1C 9.2) - pt unwilling to take meds and was educated on risk of uncontrolled DM.  b. A1C 5.9 in 11/2013.  Marland Kitchen Coronary artery disease   . Blind   . ESRD on hemodialysis ([place])     Mon, Wed, Fri  . CHF  (congestive heart failure) ([place])   . MI (myocardial infarction) ([place]) 11/2014  . Essential hypertension 08/09/2009    Qualifier: Diagnosis of  By: Sarita Haver  MD, Coralyn Mark    . Osteomyelitis ([place])   . Cardiac arrest ([place]) 12/12/2014  . Pulmonary hypertension ([place])   . Moderate tricuspid regurgitation   . Leiomyoma of uterus 08/09/2009    Qualifier: Diagnosis of  By: Sarita Haver  MD, Coralyn Mark      Past Surgical History  Procedure Laterality Date  . No past surgeries    . Insertion of dialysis catheter Right 01/02/2014    Procedure: INSERTION OF DIALYSIS CATHETER;  Surgeon: Rosetta Posner, MD;  Location: [facility];  Service: Vascular;  Laterality: Right;  . Av fistula placement Left 01/02/2014    Procedure: ARTERIOVENOUS (AV) FISTULA CREATION;  Surgeon: Rosetta Posner, MD;  Location: [facility];  Service: Vascular;  Laterality: Left;  . Amputation Bilateral 04/02/2015    Procedure: Amputation Bilateral Great Toes MTP Joint;  Surgeon: Newt Minion, MD;  Location: [facility];  Service: Orthopedics;  Laterality: Bilateral;  . Amputation Left 05/14/2015    Procedure: Left Transmetatarsal Amputation;  Surgeon: Newt Minion, MD;  Location: [facility];  Service: Orthopedics;  Laterality: Left;  . Orif patella Left 05/17/2015    Procedure: OPEN REDUCTION INTERNAL (ORIF) FIXATION PATELLA;  Surgeon: Beverely Low  Fernanda Drum, MD;  Location: [facility];  Service: Orthopedics;  Laterality: Left;  . Amputation Right 08/20/2015    Procedure: Transmetatarsal Amputation Right Foot;  Surgeon: Newt Minion, MD;  Location: [facility];  Service: Orthopedics;  Laterality: Right;      Medication List       This list is accurate as of: 12/23/15  8:09 PM.  Always use your most recent med list.               amiodarone 200 MG tablet  Commonly known as:  PACERONE  Take 200 mg by mouth daily.     calcium acetate 667 MG capsule  Commonly known as:  PHOSLO  Take 1 capsule (667 mg total) by mouth 3 (three) times daily with meals.     clopidogrel 75 MG tablet   Commonly known as:  PLAVIX  Take 1 tablet (75 mg total) by mouth daily.     metoprolol succinate 50 MG 24 hr tablet  Commonly known as:  TOPROL-XL  Take 1 tablet (50 mg total) by mouth daily. Take with or immediately following a meal.     nitroGLYCERIN 0.4 MG SL tablet  Commonly known as:  NITROSTAT  Place 0.4 mg under the tongue every 5 (five) minutes as needed for chest pain.     senna 8.6 MG Tabs tablet  Commonly known as:  SENOKOT  Take 1 tablet by mouth 2 (two) times daily as needed for mild constipation.        No orders of the defined types were placed in this encounter.    Immunization History  Administered Date(s) Administered  . Influenza,inj,Quad PF,36+ Mos 01/03/2014  . Influenza-Unspecified 09/24/2014  . PPD Test 07/28/2014  . Pneumococcal Polysaccharide-23 01/03/2014    Social History  Substance Use Topics  . Smoking status: Never Smoker   . Smokeless tobacco: Not on file     Comment: Prior dip  . Alcohol Use: No    Review of Systems  DATA OBTAINED: from patient, nurse - through interpretor GENERAL:  no fevers, fatigue, appetite changes SKIN: No itching, rash HEENT: No complaint RESPIRATORY: No cough, wheezing, SOB CARDIAC: No chest pain, palpitations, lower extremity edema  GI: No abdominal pain, No N/V/D; +constipation, No heartburn or reflux  GU: No dysuria, frequency or urgency, or incontinence  MUSCULOSKELETAL: No unrelieved bone/joint pain NEUROLOGIC: No headache, dizziness  PSYCHIATRIC: No overt anxiety or sadness  Filed Vitals:   12/23/15 1246  BP: 106/66  Pulse: 74  Temp: 98 F (36.7 C)  Resp: 20    Physical Exam  GENERAL APPEARANCE: Alert, conversant, No acute distress, was eating lunch and eating well  SKIN: No diaphoresis rash HEENT: Unremarkable RESPIRATORY: Breathing is even, unlabored. Lung sounds are clear   CARDIOVASCULAR: Heart RRR no murmurs, rubs or gallops. trace peripheral edema; shunt L arm with good thrill   GASTROINTESTINAL: Abdomen is soft, non-tender, not distended w/ normal bowel sounds.  GENITOURINARY: Bladder non tender, not distended  MUSCULOSKELETAL: No abnormal joints or musculature NEUROLOGIC: Cranial nerves 2-12 grossly intact. Moves all extremities PSYCHIATRIC: child like affect, no behavioral issues  Patient Active Problem List   Diagnosis Date Noted  . Chronic combined systolic and diastolic congestive heart failure ([place]) 12/15/2015  . Dyslipidemia associated with type 2 diabetes mellitus ([place]) 12/15/2015  . Ventricular tachycardia, sustained ([place]) 12/15/2015  . Psychosis, paranoid ([place]) 08/20/2015  . Non compliance w medication regimen 08/20/2015  . Patellar fracture 05/16/2015  . Acute respiratory failure ([place])  04/20/2015  . ESRD (end stage renal disease) ([place]) 04/19/2015  . PAD (peripheral artery disease) ([place]) 03/23/2015  . FTT (failure to thrive) in adult 01/14/2015  . Blindness with deafness 12/16/2014  . Anemia in CKD (chronic kidney disease) 12/14/2014  . Cardiomyopathy, ischemic-EF 30-35% 11/21/2014  . NSTEMI (non-ST elevated myocardial infarction) ([place]) 11/20/2014  . Constipation 12/24/2013  . Diabetes mellitus with ESRD (end-stage renal disease) ([facility]) May 05, 202014  . Essential hypertension 08/09/2009    CBC    Component Value Date/Time   WBC 6.0 08/23/2015 0945   WBC 7.2 09/02/2014   RBC 3.23* 08/23/2015 0945   HGB 9.6* 08/23/2015 0945   HCT 29.3* 08/23/2015 0945   PLT 206 08/23/2015 0945   MCV 90.7 08/23/2015 0945   LYMPHSABS 1.1 08/23/2015 0945   MONOABS 0.8 08/23/2015 0945   EOSABS 0.6 08/23/2015 0945   BASOSABS 0.1 08/23/2015 0945    CMP     Component Value Date/Time   NA 131* 08/23/2015 0946   K 4.5 08/23/2015 0946   CL 96* 08/23/2015 0946   CO2 26 08/23/2015 0946   GLUCOSE 97 08/23/2015 0946   BUN 45* 08/23/2015 0946   BUN 64* 09/02/2014   CREATININE 6.04* 08/23/2015 0946   CREATININE 5.4* 09/02/2014   CREATININE 0.76 05/23/2011 0956    CALCIUM 8.5* 08/23/2015 0946   PROT 7.7 08/19/2015 1838   ALBUMIN 2.9* 08/23/2015 0946   AST 15 08/19/2015 1838   ALT 16 08/19/2015 1838   ALKPHOS 117 08/19/2015 1838   BILITOT 0.8 08/19/2015 1838   GFRNONAA 7* 08/23/2015 0946   GFRAA 8* 08/23/2015 0946    Assessment and Plan  Non compliance w medication regimen Pt has refused dialysis last 4 times and I am with her today to discuss this. She had told nursing staff she was ready to die. Her son is upset because the SNF can't make her go. Throught the interpretor it appears that the son visited with her last night and talked her into starting dialysis again. After much conversation it appears she understands that its not just tomorrow but 3 times a week she is going to be going for dialysis.  Constipation Pt c/o feeling full of poop. She has gotten something for it but it is nit working.Will start miralax 17 mg daily until good result then daily prn after that.  ESRD (end stage renal disease) Pt has missed 4 dialysis sessions and she is doing fine. She will go tomorrow 10 am. She is not SOB, her lungs are clear and she has trace pedal edema. I would like to have a BMP but before the results come back she will be at dialysis.   Time spent > 35 min;> 50% of time with patient was spent reviewing records, labs, tests and studies, counseling and developing plan of care  Hennie Duos, MD

## 2015-12-23 NOTE — Assessment & Plan Note (Addendum)
Pt has refused dialysis last 4 times and I am with her today to discuss this. She had told nursing staff she was ready to die. Her son is upset because the SNF can't make her go. Throught the interpretor it appears that the son visited with her last night and talked her into starting dialysis again. After much conversation it appears she understands that its not just tomorrow but 3 times a week she is going to be going for dialysis.

## 2015-12-23 NOTE — Assessment & Plan Note (Addendum)
Pt c/o feeling full of poop. She has gotten something for it but it is nit working.Will start miralax 17 mg daily until good result then daily prn after that.

## 2015-12-24 DIAGNOSIS — F209 Schizophrenia, unspecified: Secondary | ICD-10-CM | POA: Diagnosis not present

## 2015-12-24 DIAGNOSIS — F329 Major depressive disorder, single episode, unspecified: Secondary | ICD-10-CM | POA: Diagnosis not present

## 2015-12-25 DIAGNOSIS — Z992 Dependence on renal dialysis: Secondary | ICD-10-CM | POA: Diagnosis not present

## 2015-12-25 DIAGNOSIS — E1129 Type 2 diabetes mellitus with other diabetic kidney complication: Secondary | ICD-10-CM | POA: Diagnosis not present

## 2015-12-25 DIAGNOSIS — N186 End stage renal disease: Secondary | ICD-10-CM | POA: Diagnosis not present

## 2015-12-27 DIAGNOSIS — E1129 Type 2 diabetes mellitus with other diabetic kidney complication: Secondary | ICD-10-CM | POA: Diagnosis not present

## 2015-12-27 DIAGNOSIS — D631 Anemia in chronic kidney disease: Secondary | ICD-10-CM | POA: Diagnosis not present

## 2015-12-27 DIAGNOSIS — N186 End stage renal disease: Secondary | ICD-10-CM | POA: Diagnosis not present

## 2015-12-27 DIAGNOSIS — N2581 Secondary hyperparathyroidism of renal origin: Secondary | ICD-10-CM | POA: Diagnosis not present

## 2015-12-29 DIAGNOSIS — E1129 Type 2 diabetes mellitus with other diabetic kidney complication: Secondary | ICD-10-CM | POA: Diagnosis not present

## 2015-12-29 DIAGNOSIS — N2581 Secondary hyperparathyroidism of renal origin: Secondary | ICD-10-CM | POA: Diagnosis not present

## 2015-12-29 DIAGNOSIS — D631 Anemia in chronic kidney disease: Secondary | ICD-10-CM | POA: Diagnosis not present

## 2015-12-29 DIAGNOSIS — N186 End stage renal disease: Secondary | ICD-10-CM | POA: Diagnosis not present

## 2015-12-31 DIAGNOSIS — D631 Anemia in chronic kidney disease: Secondary | ICD-10-CM | POA: Diagnosis not present

## 2015-12-31 DIAGNOSIS — N2581 Secondary hyperparathyroidism of renal origin: Secondary | ICD-10-CM | POA: Diagnosis not present

## 2015-12-31 DIAGNOSIS — N186 End stage renal disease: Secondary | ICD-10-CM | POA: Diagnosis not present

## 2015-12-31 DIAGNOSIS — E1129 Type 2 diabetes mellitus with other diabetic kidney complication: Secondary | ICD-10-CM | POA: Diagnosis not present

## 2016-01-03 DIAGNOSIS — D631 Anemia in chronic kidney disease: Secondary | ICD-10-CM | POA: Diagnosis not present

## 2016-01-03 DIAGNOSIS — E1129 Type 2 diabetes mellitus with other diabetic kidney complication: Secondary | ICD-10-CM | POA: Diagnosis not present

## 2016-01-03 DIAGNOSIS — N2581 Secondary hyperparathyroidism of renal origin: Secondary | ICD-10-CM | POA: Diagnosis not present

## 2016-01-03 DIAGNOSIS — N186 End stage renal disease: Secondary | ICD-10-CM | POA: Diagnosis not present

## 2016-01-05 DIAGNOSIS — N186 End stage renal disease: Secondary | ICD-10-CM | POA: Diagnosis not present

## 2016-01-05 DIAGNOSIS — N2581 Secondary hyperparathyroidism of renal origin: Secondary | ICD-10-CM | POA: Diagnosis not present

## 2016-01-05 DIAGNOSIS — E1129 Type 2 diabetes mellitus with other diabetic kidney complication: Secondary | ICD-10-CM | POA: Diagnosis not present

## 2016-01-05 DIAGNOSIS — D631 Anemia in chronic kidney disease: Secondary | ICD-10-CM | POA: Diagnosis not present

## 2016-01-07 DIAGNOSIS — E1129 Type 2 diabetes mellitus with other diabetic kidney complication: Secondary | ICD-10-CM | POA: Diagnosis not present

## 2016-01-07 DIAGNOSIS — N2581 Secondary hyperparathyroidism of renal origin: Secondary | ICD-10-CM | POA: Diagnosis not present

## 2016-01-07 DIAGNOSIS — D631 Anemia in chronic kidney disease: Secondary | ICD-10-CM | POA: Diagnosis not present

## 2016-01-07 DIAGNOSIS — N186 End stage renal disease: Secondary | ICD-10-CM | POA: Diagnosis not present

## 2016-01-10 DIAGNOSIS — N2581 Secondary hyperparathyroidism of renal origin: Secondary | ICD-10-CM | POA: Diagnosis not present

## 2016-01-10 DIAGNOSIS — D631 Anemia in chronic kidney disease: Secondary | ICD-10-CM | POA: Diagnosis not present

## 2016-01-10 DIAGNOSIS — E1129 Type 2 diabetes mellitus with other diabetic kidney complication: Secondary | ICD-10-CM | POA: Diagnosis not present

## 2016-01-10 DIAGNOSIS — N186 End stage renal disease: Secondary | ICD-10-CM | POA: Diagnosis not present

## 2016-01-11 LAB — HM DIABETES FOOT EXAM

## 2016-01-12 DIAGNOSIS — N2581 Secondary hyperparathyroidism of renal origin: Secondary | ICD-10-CM | POA: Diagnosis not present

## 2016-01-12 DIAGNOSIS — N186 End stage renal disease: Secondary | ICD-10-CM | POA: Diagnosis not present

## 2016-01-12 DIAGNOSIS — E1129 Type 2 diabetes mellitus with other diabetic kidney complication: Secondary | ICD-10-CM | POA: Diagnosis not present

## 2016-01-12 DIAGNOSIS — D631 Anemia in chronic kidney disease: Secondary | ICD-10-CM | POA: Diagnosis not present

## 2016-01-14 DIAGNOSIS — N2581 Secondary hyperparathyroidism of renal origin: Secondary | ICD-10-CM | POA: Diagnosis not present

## 2016-01-14 DIAGNOSIS — N186 End stage renal disease: Secondary | ICD-10-CM | POA: Diagnosis not present

## 2016-01-14 DIAGNOSIS — D631 Anemia in chronic kidney disease: Secondary | ICD-10-CM | POA: Diagnosis not present

## 2016-01-14 DIAGNOSIS — E1129 Type 2 diabetes mellitus with other diabetic kidney complication: Secondary | ICD-10-CM | POA: Diagnosis not present

## 2016-01-17 DIAGNOSIS — E1129 Type 2 diabetes mellitus with other diabetic kidney complication: Secondary | ICD-10-CM | POA: Diagnosis not present

## 2016-01-17 DIAGNOSIS — D631 Anemia in chronic kidney disease: Secondary | ICD-10-CM | POA: Diagnosis not present

## 2016-01-17 DIAGNOSIS — N2581 Secondary hyperparathyroidism of renal origin: Secondary | ICD-10-CM | POA: Diagnosis not present

## 2016-01-17 DIAGNOSIS — N186 End stage renal disease: Secondary | ICD-10-CM | POA: Diagnosis not present

## 2016-01-19 DIAGNOSIS — N186 End stage renal disease: Secondary | ICD-10-CM | POA: Diagnosis not present

## 2016-01-19 DIAGNOSIS — E1129 Type 2 diabetes mellitus with other diabetic kidney complication: Secondary | ICD-10-CM | POA: Diagnosis not present

## 2016-01-19 DIAGNOSIS — D631 Anemia in chronic kidney disease: Secondary | ICD-10-CM | POA: Diagnosis not present

## 2016-01-19 DIAGNOSIS — N2581 Secondary hyperparathyroidism of renal origin: Secondary | ICD-10-CM | POA: Diagnosis not present

## 2016-01-19 IMAGING — DX DG CHEST 2V
2 series · 2 of 2 positions shown · non-contrast
Comparison: 01/02/2014.

CLINICAL DATA: Respiratory distress and chest pain.

EXAM:
CHEST  2 VIEW

[chest lat]
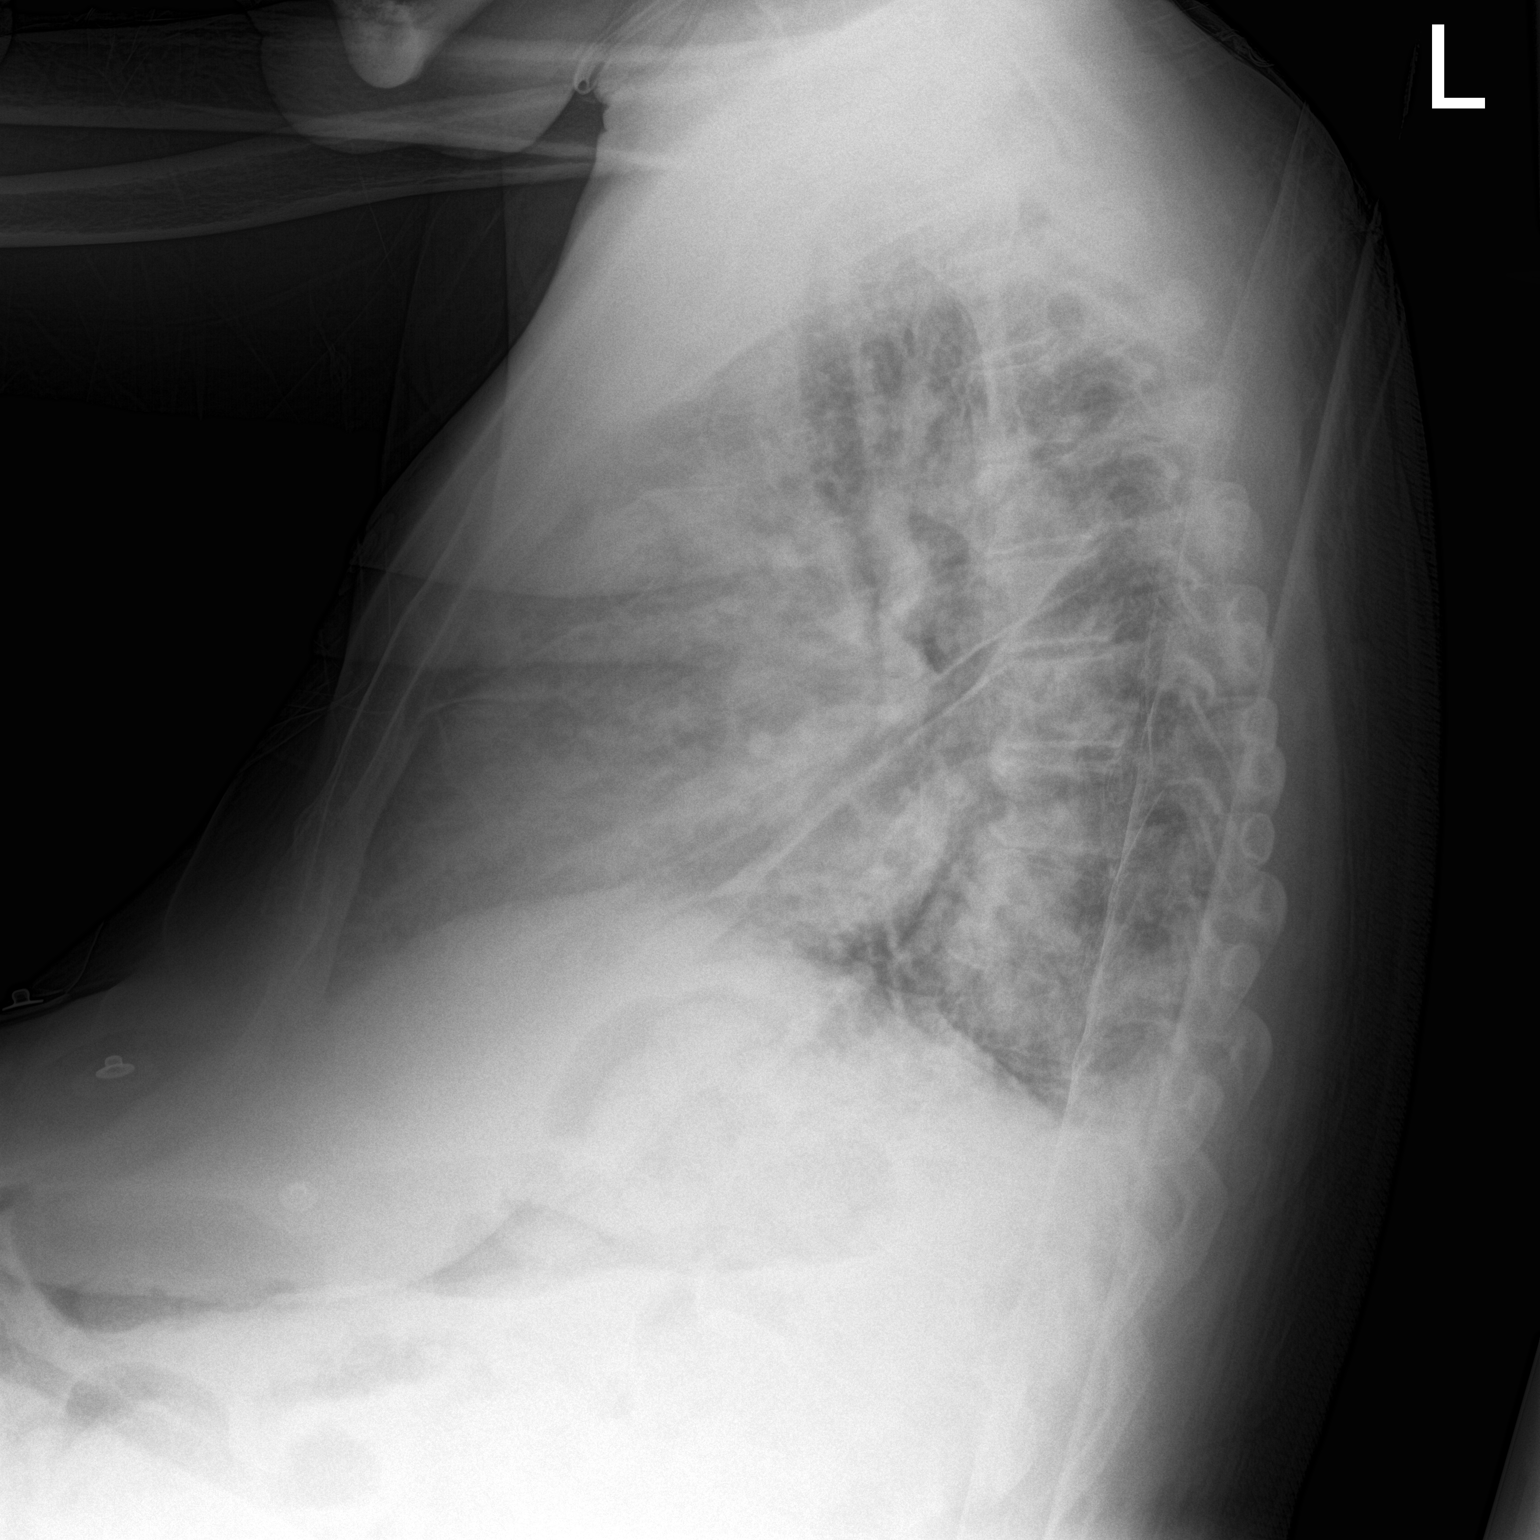

[chest ap]
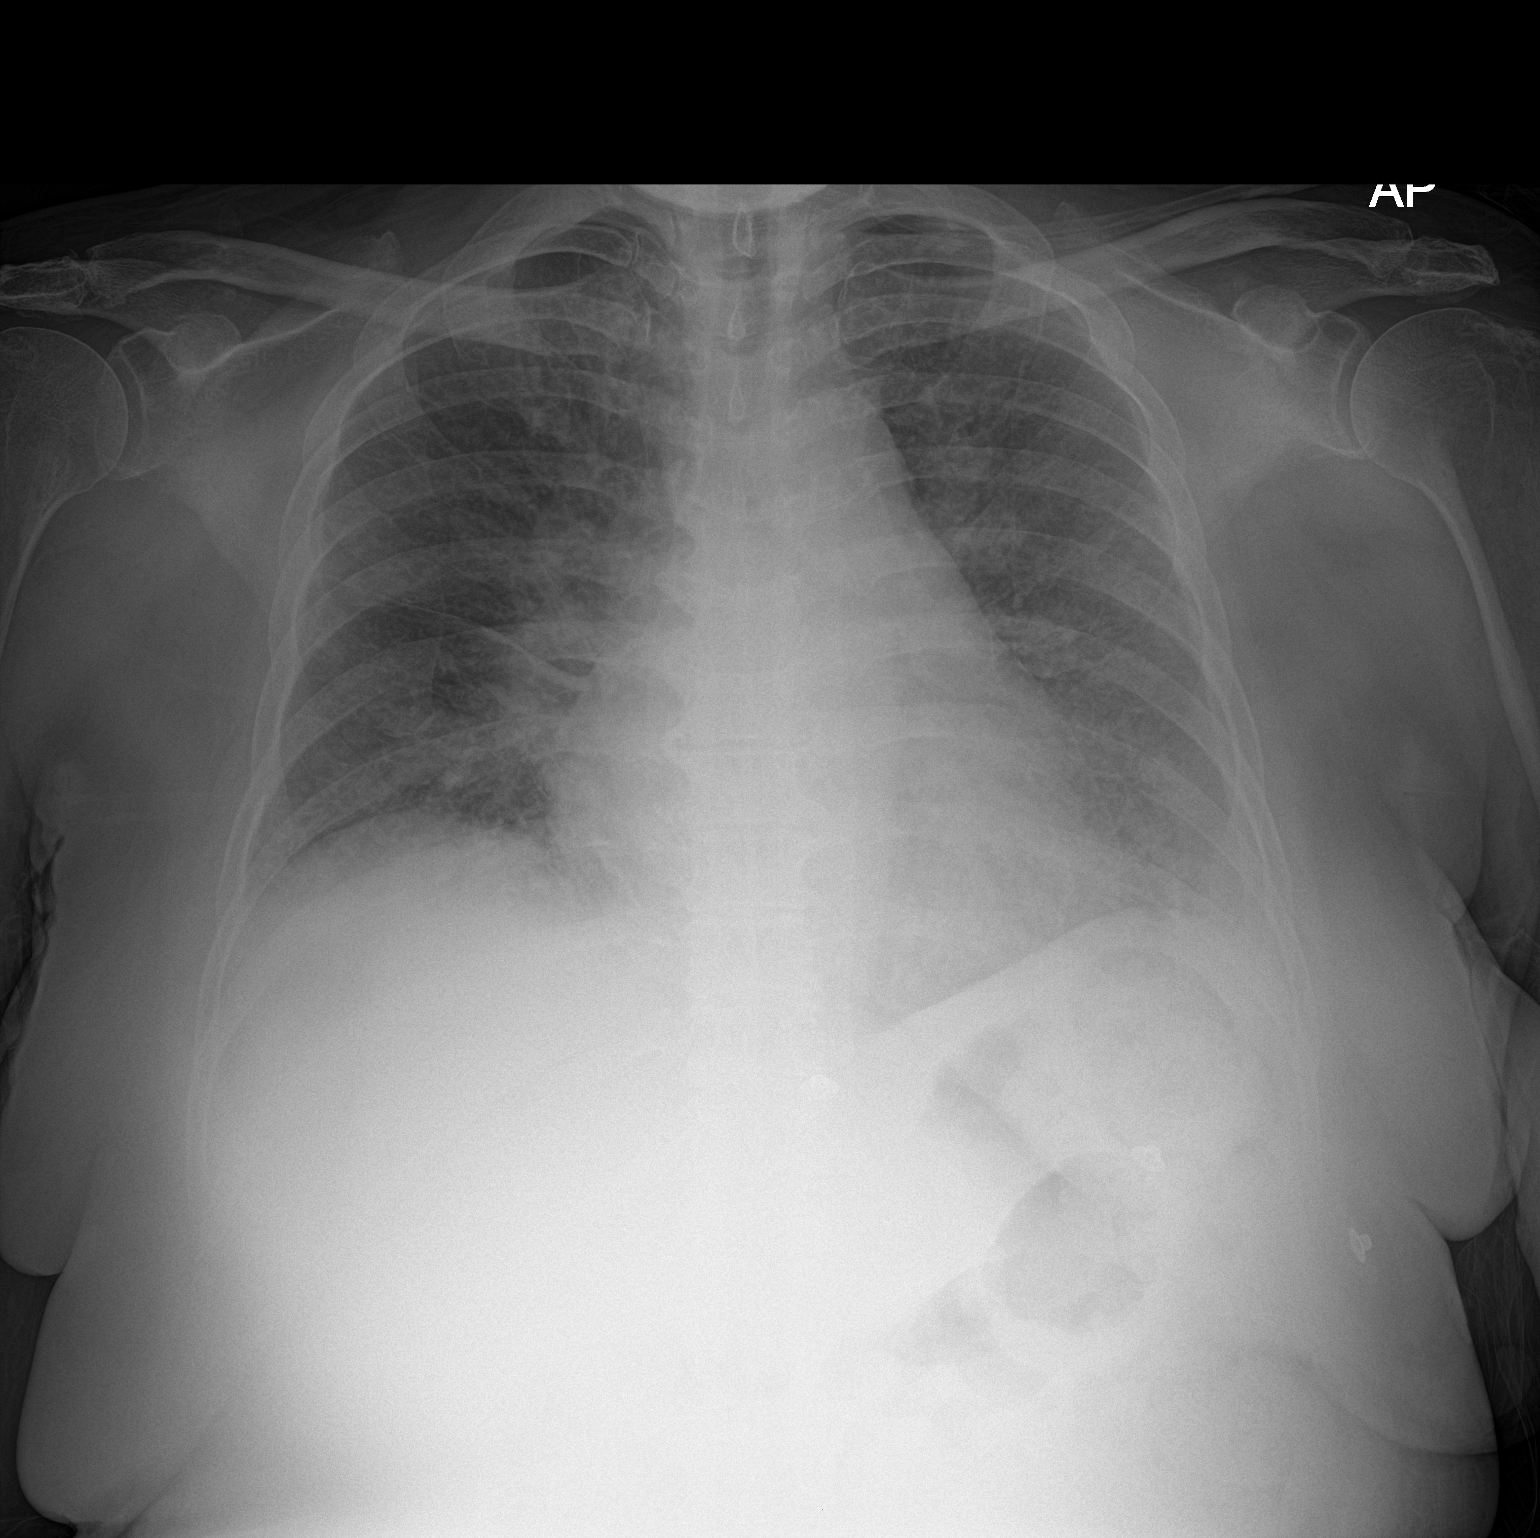

[2 of 2 positions shown; findings below may reference images not displayed]

FINDINGS: Mild cardiac enlargement. Small bilateral pleural effusions and mild
to moderate interstitial edema is identified. No focal airspace
consolidation identified. Spondylosis noted within the thoracic
spine.
IMPRESSION: 1. Mild to moderate CHF.

## 2016-01-21 DIAGNOSIS — N2581 Secondary hyperparathyroidism of renal origin: Secondary | ICD-10-CM | POA: Diagnosis not present

## 2016-01-21 DIAGNOSIS — E1129 Type 2 diabetes mellitus with other diabetic kidney complication: Secondary | ICD-10-CM | POA: Diagnosis not present

## 2016-01-21 DIAGNOSIS — D631 Anemia in chronic kidney disease: Secondary | ICD-10-CM | POA: Diagnosis not present

## 2016-01-21 DIAGNOSIS — N186 End stage renal disease: Secondary | ICD-10-CM | POA: Diagnosis not present

## 2016-01-24 ENCOUNTER — Encounter: Payer: Self-pay | Admitting: Internal Medicine

## 2016-01-24 ENCOUNTER — Non-Acute Institutional Stay (SKILLED_NURSING_FACILITY): Payer: Medicare Other | Admitting: Internal Medicine

## 2016-01-24 DIAGNOSIS — I110 Hypertensive heart disease with heart failure: Secondary | ICD-10-CM

## 2016-01-24 DIAGNOSIS — I5042 Chronic combined systolic (congestive) and diastolic (congestive) heart failure: Secondary | ICD-10-CM | POA: Diagnosis not present

## 2016-01-24 DIAGNOSIS — D631 Anemia in chronic kidney disease: Secondary | ICD-10-CM | POA: Diagnosis not present

## 2016-01-24 DIAGNOSIS — I472 Ventricular tachycardia, unspecified: Secondary | ICD-10-CM

## 2016-01-24 DIAGNOSIS — E1129 Type 2 diabetes mellitus with other diabetic kidney complication: Secondary | ICD-10-CM | POA: Diagnosis not present

## 2016-01-24 DIAGNOSIS — N2581 Secondary hyperparathyroidism of renal origin: Secondary | ICD-10-CM | POA: Diagnosis not present

## 2016-01-24 DIAGNOSIS — N186 End stage renal disease: Secondary | ICD-10-CM | POA: Diagnosis not present

## 2016-01-24 NOTE — Progress Notes (Signed)
MRN: [id number] Name: [name]  Sex: female Age: 60 y.o. DOB: [birth year]  [place] #: Karren Burly Facility/Room:202 Level Of Care: SNF Provider: Inocencio Homes D Emergency Contacts: Extended Emergency Contact Information Primary Emergency Contact: Riggle,[name] Address: [place], [place] [address] [place] Phone: [phone] Relation: Spouse Secondary Emergency Contact: Haynes Dage  [place] Phone: [phone] Mobile Phone: [phone] Relation: Sister  Code Status:   Allergies: Review of patient's allergies indicates no known allergies.  Chief Complaint  Patient presents with  . Medical Management of Chronic Issues    HPI: Patient is 60 y.o. female who is being seen for routine issues of HTN, CHF and h/o sustained V tach.  Past Medical History  Diagnosis Date  . Hyperlipidemia   . Hypertension     a. Has previously refused blood pressure meds.   . Deaf     USE SIGN INTERPRETER.  Divorced from husband but lives with him. Has daughter but she does not care for her.  . Bilateral leg edema     a. Chronic.  Marland Kitchen Psychiatric disorder     She frequently exhibits paranoia and has been diagnosed with psychotic d/o NOS during hospital stay in the past. She is tangetial and perseverative during her visits. She apparently has had a bad experience with mental health in [place] in the past and refuses to discuss mental issues for fear that she will be sent back there. Her paranoia, communication issues, financial woes and lack of fam  . Fibroid uterus   . Anemia     Due to fibroids.  . Heart murmur   . Diabetes mellitus     a. Per PCP note 2012 (A1C 9.2) - pt unwilling to take meds and was educated on risk of uncontrolled DM.  b. A1C 5.9 in 11/2013.  Marland Kitchen Coronary artery disease   . Blind   . ESRD on hemodialysis ([place])     Mon, Wed, Fri  . CHF (congestive heart failure) ([place])   . MI (myocardial infarction) ([facility])  11/2014  . Essential hypertension 08/09/2009    Qualifier: Diagnosis of  By: Sarita Haver  MD, Coralyn Mark    . Osteomyelitis ([place])   . Cardiac arrest ([place]) 12/12/2014  . Pulmonary hypertension ([place])   . Moderate tricuspid regurgitation   . Leiomyoma of uterus 08/09/2009    Qualifier: Diagnosis of  By: Sarita Haver  MD, Coralyn Mark      Past Surgical History  Procedure Laterality Date  . No past surgeries    . Insertion of dialysis catheter Right 01/02/2014    Procedure: INSERTION OF DIALYSIS CATHETER;  Surgeon: Rosetta Posner, MD;  Location: [facility];  Service: Vascular;  Laterality: Right;  . Av fistula placement Left 01/02/2014    Procedure: ARTERIOVENOUS (AV) FISTULA CREATION;  Surgeon: Rosetta Posner, MD;  Location: [facility];  Service: Vascular;  Laterality: Left;  . Amputation Bilateral 04/02/2015    Procedure: Amputation Bilateral Great Toes MTP Joint;  Surgeon: Newt Minion, MD;  Location: [facility];  Service: Orthopedics;  Laterality: Bilateral;  . Amputation Left 05/14/2015    Procedure: Left Transmetatarsal Amputation;  Surgeon: Newt Minion, MD;  Location: [facility];  Service: Orthopedics;  Laterality: Left;  . Orif patella Left 05/17/2015    Procedure: OPEN REDUCTION INTERNAL (ORIF) FIXATION PATELLA;  Surgeon: Newt Minion, MD;  Location: [facility];  Service: Orthopedics;  Laterality: Left;  .  Amputation Right 08/20/2015    Procedure: Transmetatarsal Amputation Right Foot;  Surgeon: Newt Minion, MD;  Location: [facility];  Service: Orthopedics;  Laterality: Right;      Medication List       This list is accurate as of: 01/24/16 11:59 PM.  Always use your most recent med list.               amiodarone 200 MG tablet  Commonly known as:  PACERONE  Take 200 mg by mouth daily.     calcium acetate 667 MG capsule  Commonly known as:  PHOSLO  Take 1 capsule (667 mg total) by mouth 3 (three) times daily with meals.     clopidogrel 75 MG tablet  Commonly known as:  PLAVIX  Take 1 tablet (75 mg total) by mouth daily.      metoprolol succinate 50 MG 24 hr tablet  Commonly known as:  TOPROL-XL  Take 1 tablet (50 mg total) by mouth daily. Take with or immediately following a meal.     nitroGLYCERIN 0.4 MG SL tablet  Commonly known as:  NITROSTAT  Place 0.4 mg under the tongue every 5 (five) minutes as needed for chest pain.     senna 8.6 MG Tabs tablet  Commonly known as:  SENOKOT  Take 1 tablet by mouth 2 (two) times daily as needed for mild constipation.        No orders of the defined types were placed in this encounter.    Immunization History  Administered Date(s) Administered  . Influenza,inj,Quad PF,36+ Mos 01/03/2014  . Influenza-Unspecified 09/24/2014  . PPD Test 07/28/2014  . Pneumococcal Polysaccharide-23 01/03/2014    Social History  Substance Use Topics  . Smoking status: Never Smoker   . Smokeless tobacco: Not on file     Comment: Prior dip  . Alcohol Use: No    Review of Systems  DATA OBTAINED: from patient, nurse GENERAL:  no fevers, fatigue, appetite changes SKIN: No itching, rash HEENT: No complaint RESPIRATORY: No cough, wheezing, SOB CARDIAC: No chest pain, palpitations, lower extremity edema  GI: No abdominal pain, No N/V/D or constipation, No heartburn or reflux  GU: No dysuria, frequency or urgency, or incontinence  MUSCULOSKELETAL: No unrelieved bone/joint pain NEUROLOGIC: No headache, dizziness  PSYCHIATRIC: depressed  Filed Vitals:   01/24/16 1310  BP: 144/74  Pulse: 67  Temp: 98 F (36.7 C)  Resp: 19    Physical Exam  GENERAL APPEARANCE: Alert, No acute distress  SKIN: No diaphoresis rash, HEENT: pt is blind RESPIRATORY: Breathing is even, unlabored. Lung sounds are clear   CARDIOVASCULAR: Heart RRR no murmurs, rubs or gallops. No peripheral edema ; fistula LUE with thrill GASTROINTESTINAL: Abdomen is soft, non-tender, not distended w/ normal bowel sounds.  GENITOURINARY: Bladder non tender, not distended  MUSCULOSKELETAL ; B transmetatarsal  amputations NEUROLOGIC: Cranial nerves 2-12 grossly intact. Moves all extremities PSYCHIATRIC: flat, no behavioral issues  Patient Active Problem List   Diagnosis Date Noted  . Chronic combined systolic and diastolic congestive heart failure ([place]) 12/15/2015  . Dyslipidemia associated with type 2 diabetes mellitus ([place]) 12/15/2015  . Ventricular tachycardia, sustained ([place]) 12/15/2015  . Psychosis, paranoid ([place]) 08/20/2015  . Non compliance w medication regimen 08/20/2015  . Patellar fracture 05/16/2015  . Acute respiratory failure ([place]) 04/20/2015  . ESRD (end stage renal disease) ([place]) 04/19/2015  . PAD (peripheral artery disease) ([facility]) 03/23/2015  . FTT (failure to thrive) in adult 01/14/2015  . Blindness with deafness  12/16/2014  . Anemia in CKD (chronic kidney disease) 12/14/2014  . Cardiomyopathy, ischemic-EF 30-35% 11/21/2014  . NSTEMI (non-ST elevated myocardial infarction) ([facility]) 11/20/2014  . Constipation 12/24/2013  . Diabetes mellitus with ESRD (end-stage renal disease) ([place]) 04/29/2013  . Hypertensive heart disease with CHF (congestive heart failure) ([facility]) 08/09/2009    CBC    Component Value Date/Time   WBC 6.0 08/23/2015 0945   WBC 7.2 09/02/2014   RBC 3.23* 08/23/2015 0945   HGB 9.6* 08/23/2015 0945   HCT 29.3* 08/23/2015 0945   PLT 206 08/23/2015 0945   MCV 90.7 08/23/2015 0945   LYMPHSABS 1.1 08/23/2015 0945   MONOABS 0.8 08/23/2015 0945   EOSABS 0.6 08/23/2015 0945   BASOSABS 0.1 08/23/2015 0945    CMP     Component Value Date/Time   NA 131* 08/23/2015 0946   K 4.5 08/23/2015 0946   CL 96* 08/23/2015 0946   CO2 26 08/23/2015 0946   GLUCOSE 97 08/23/2015 0946   BUN 45* 08/23/2015 0946   BUN 64* 09/02/2014   CREATININE 6.04* 08/23/2015 0946   CREATININE 5.4* 09/02/2014   CREATININE 0.76 05/23/2011 0956   CALCIUM 8.5* 08/23/2015 0946   PROT 7.7 08/19/2015 1838   ALBUMIN 2.9* 08/23/2015 0946   AST 15 08/19/2015 1838   ALT 16 08/19/2015 1838    ALKPHOS 117 08/19/2015 1838   BILITOT 0.8 08/19/2015 1838   GFRNONAA 7* 08/23/2015 0946   GFRAA 8* 08/23/2015 0946    Assessment and Plan  Hypertensive heart disease with CHF (congestive heart failure) ([place]) presently stable will continue toprol xl 50 mg daily    Chronic combined systolic and diastolic congestive heart failure ([facility]) EF is 30-35%; is presently stable will continue toprol xl 50 mg daily    Ventricular tachycardia, sustained ([place]) not a candidate for ICD. Is on amiodarone 200 mg daily for heart rate control will monitor     Hennie Duos, MD

## 2016-01-25 DIAGNOSIS — E1129 Type 2 diabetes mellitus with other diabetic kidney complication: Secondary | ICD-10-CM | POA: Diagnosis not present

## 2016-01-25 DIAGNOSIS — N186 End stage renal disease: Secondary | ICD-10-CM | POA: Diagnosis not present

## 2016-01-25 DIAGNOSIS — Z992 Dependence on renal dialysis: Secondary | ICD-10-CM | POA: Diagnosis not present

## 2016-01-26 DIAGNOSIS — D631 Anemia in chronic kidney disease: Secondary | ICD-10-CM | POA: Diagnosis not present

## 2016-01-26 DIAGNOSIS — E1129 Type 2 diabetes mellitus with other diabetic kidney complication: Secondary | ICD-10-CM | POA: Diagnosis not present

## 2016-01-26 DIAGNOSIS — N186 End stage renal disease: Secondary | ICD-10-CM | POA: Diagnosis not present

## 2016-01-26 DIAGNOSIS — N2581 Secondary hyperparathyroidism of renal origin: Secondary | ICD-10-CM | POA: Diagnosis not present

## 2016-01-28 DIAGNOSIS — N2581 Secondary hyperparathyroidism of renal origin: Secondary | ICD-10-CM | POA: Diagnosis not present

## 2016-01-28 DIAGNOSIS — N186 End stage renal disease: Secondary | ICD-10-CM | POA: Diagnosis not present

## 2016-01-28 DIAGNOSIS — E1129 Type 2 diabetes mellitus with other diabetic kidney complication: Secondary | ICD-10-CM | POA: Diagnosis not present

## 2016-01-28 DIAGNOSIS — D631 Anemia in chronic kidney disease: Secondary | ICD-10-CM | POA: Diagnosis not present

## 2016-01-30 ENCOUNTER — Encounter: Payer: Self-pay | Admitting: Internal Medicine

## 2016-01-30 NOTE — Assessment & Plan Note (Signed)
EF is 30-35%; is presently stable will continue toprol xl 50 mg daily

## 2016-01-30 NOTE — Assessment & Plan Note (Signed)
not a candidate for ICD. Is on amiodarone 200 mg daily for heart rate control will monitor

## 2016-01-30 NOTE — Assessment & Plan Note (Signed)
presently stable will continue toprol xl 50 mg daily

## 2016-01-31 DIAGNOSIS — D631 Anemia in chronic kidney disease: Secondary | ICD-10-CM | POA: Diagnosis not present

## 2016-01-31 DIAGNOSIS — N2581 Secondary hyperparathyroidism of renal origin: Secondary | ICD-10-CM | POA: Diagnosis not present

## 2016-01-31 DIAGNOSIS — E1129 Type 2 diabetes mellitus with other diabetic kidney complication: Secondary | ICD-10-CM | POA: Diagnosis not present

## 2016-01-31 DIAGNOSIS — N186 End stage renal disease: Secondary | ICD-10-CM | POA: Diagnosis not present

## 2016-02-02 DIAGNOSIS — N2581 Secondary hyperparathyroidism of renal origin: Secondary | ICD-10-CM | POA: Diagnosis not present

## 2016-02-02 DIAGNOSIS — E1129 Type 2 diabetes mellitus with other diabetic kidney complication: Secondary | ICD-10-CM | POA: Diagnosis not present

## 2016-02-02 DIAGNOSIS — D631 Anemia in chronic kidney disease: Secondary | ICD-10-CM | POA: Diagnosis not present

## 2016-02-02 DIAGNOSIS — N186 End stage renal disease: Secondary | ICD-10-CM | POA: Diagnosis not present

## 2016-02-03 ENCOUNTER — Encounter (HOSPITAL_BASED_OUTPATIENT_CLINIC_OR_DEPARTMENT_OTHER): Payer: Medicare Other | Attending: Internal Medicine

## 2016-02-03 DIAGNOSIS — I252 Old myocardial infarction: Secondary | ICD-10-CM | POA: Insufficient documentation

## 2016-02-03 DIAGNOSIS — Y835 Amputation of limb(s) as the cause of abnormal reaction of the patient, or of later complication, without mention of misadventure at the time of the procedure: Secondary | ICD-10-CM | POA: Insufficient documentation

## 2016-02-03 DIAGNOSIS — I130 Hypertensive heart and chronic kidney disease with heart failure and stage 1 through stage 4 chronic kidney disease, or unspecified chronic kidney disease: Secondary | ICD-10-CM | POA: Insufficient documentation

## 2016-02-03 DIAGNOSIS — E1151 Type 2 diabetes mellitus with diabetic peripheral angiopathy without gangrene: Secondary | ICD-10-CM | POA: Insufficient documentation

## 2016-02-03 DIAGNOSIS — E11621 Type 2 diabetes mellitus with foot ulcer: Secondary | ICD-10-CM | POA: Insufficient documentation

## 2016-02-03 DIAGNOSIS — H547 Unspecified visual loss: Secondary | ICD-10-CM | POA: Insufficient documentation

## 2016-02-03 DIAGNOSIS — Z89432 Acquired absence of left foot: Secondary | ICD-10-CM | POA: Diagnosis not present

## 2016-02-03 DIAGNOSIS — I509 Heart failure, unspecified: Secondary | ICD-10-CM | POA: Insufficient documentation

## 2016-02-03 DIAGNOSIS — T8189XA Other complications of procedures, not elsewhere classified, initial encounter: Secondary | ICD-10-CM | POA: Insufficient documentation

## 2016-02-03 DIAGNOSIS — N184 Chronic kidney disease, stage 4 (severe): Secondary | ICD-10-CM | POA: Insufficient documentation

## 2016-02-03 DIAGNOSIS — H913 Deaf nonspeaking, not elsewhere classified: Secondary | ICD-10-CM | POA: Diagnosis not present

## 2016-02-03 DIAGNOSIS — T8131XA Disruption of external operation (surgical) wound, not elsewhere classified, initial encounter: Secondary | ICD-10-CM | POA: Diagnosis not present

## 2016-02-03 DIAGNOSIS — L97511 Non-pressure chronic ulcer of other part of right foot limited to breakdown of skin: Secondary | ICD-10-CM | POA: Diagnosis not present

## 2016-02-03 DIAGNOSIS — F209 Schizophrenia, unspecified: Secondary | ICD-10-CM | POA: Diagnosis not present

## 2016-02-03 DIAGNOSIS — E1140 Type 2 diabetes mellitus with diabetic neuropathy, unspecified: Secondary | ICD-10-CM | POA: Insufficient documentation

## 2016-02-04 DIAGNOSIS — D631 Anemia in chronic kidney disease: Secondary | ICD-10-CM | POA: Diagnosis not present

## 2016-02-04 DIAGNOSIS — N2581 Secondary hyperparathyroidism of renal origin: Secondary | ICD-10-CM | POA: Diagnosis not present

## 2016-02-04 DIAGNOSIS — E1129 Type 2 diabetes mellitus with other diabetic kidney complication: Secondary | ICD-10-CM | POA: Diagnosis not present

## 2016-02-04 DIAGNOSIS — N186 End stage renal disease: Secondary | ICD-10-CM | POA: Diagnosis not present

## 2016-02-07 DIAGNOSIS — D631 Anemia in chronic kidney disease: Secondary | ICD-10-CM | POA: Diagnosis not present

## 2016-02-07 DIAGNOSIS — N186 End stage renal disease: Secondary | ICD-10-CM | POA: Diagnosis not present

## 2016-02-07 DIAGNOSIS — E1129 Type 2 diabetes mellitus with other diabetic kidney complication: Secondary | ICD-10-CM | POA: Diagnosis not present

## 2016-02-07 DIAGNOSIS — N2581 Secondary hyperparathyroidism of renal origin: Secondary | ICD-10-CM | POA: Diagnosis not present

## 2016-02-08 ENCOUNTER — Encounter: Payer: Self-pay | Admitting: Podiatry

## 2016-02-08 ENCOUNTER — Encounter: Payer: Medicare Other | Admitting: Podiatry

## 2016-02-09 DIAGNOSIS — N2581 Secondary hyperparathyroidism of renal origin: Secondary | ICD-10-CM | POA: Diagnosis not present

## 2016-02-09 DIAGNOSIS — N186 End stage renal disease: Secondary | ICD-10-CM | POA: Diagnosis not present

## 2016-02-09 DIAGNOSIS — E1129 Type 2 diabetes mellitus with other diabetic kidney complication: Secondary | ICD-10-CM | POA: Diagnosis not present

## 2016-02-09 DIAGNOSIS — D631 Anemia in chronic kidney disease: Secondary | ICD-10-CM | POA: Diagnosis not present

## 2016-02-10 NOTE — Progress Notes (Signed)
Patient ID: [name], female   DOB: 19-Feb-1956, 60 y.o.   MRN: [id number]  Rescheduled due to transportation and no interpreter.

## 2016-02-11 DIAGNOSIS — N186 End stage renal disease: Secondary | ICD-10-CM | POA: Diagnosis not present

## 2016-02-11 DIAGNOSIS — N2581 Secondary hyperparathyroidism of renal origin: Secondary | ICD-10-CM | POA: Diagnosis not present

## 2016-02-11 DIAGNOSIS — E1129 Type 2 diabetes mellitus with other diabetic kidney complication: Secondary | ICD-10-CM | POA: Diagnosis not present

## 2016-02-11 DIAGNOSIS — D631 Anemia in chronic kidney disease: Secondary | ICD-10-CM | POA: Diagnosis not present

## 2016-02-14 DIAGNOSIS — N2581 Secondary hyperparathyroidism of renal origin: Secondary | ICD-10-CM | POA: Diagnosis not present

## 2016-02-14 DIAGNOSIS — D631 Anemia in chronic kidney disease: Secondary | ICD-10-CM | POA: Diagnosis not present

## 2016-02-14 DIAGNOSIS — E1129 Type 2 diabetes mellitus with other diabetic kidney complication: Secondary | ICD-10-CM | POA: Diagnosis not present

## 2016-02-14 DIAGNOSIS — N186 End stage renal disease: Secondary | ICD-10-CM | POA: Diagnosis not present

## 2016-02-16 DIAGNOSIS — N2581 Secondary hyperparathyroidism of renal origin: Secondary | ICD-10-CM | POA: Diagnosis not present

## 2016-02-16 DIAGNOSIS — D631 Anemia in chronic kidney disease: Secondary | ICD-10-CM | POA: Diagnosis not present

## 2016-02-16 DIAGNOSIS — E1129 Type 2 diabetes mellitus with other diabetic kidney complication: Secondary | ICD-10-CM | POA: Diagnosis not present

## 2016-02-16 DIAGNOSIS — N186 End stage renal disease: Secondary | ICD-10-CM | POA: Diagnosis not present

## 2016-02-17 ENCOUNTER — Encounter: Payer: Self-pay | Admitting: Internal Medicine

## 2016-02-17 ENCOUNTER — Non-Acute Institutional Stay (SKILLED_NURSING_FACILITY): Payer: Medicare Other | Admitting: Internal Medicine

## 2016-02-17 DIAGNOSIS — N186 End stage renal disease: Secondary | ICD-10-CM

## 2016-02-17 DIAGNOSIS — I739 Peripheral vascular disease, unspecified: Secondary | ICD-10-CM

## 2016-02-17 DIAGNOSIS — F22 Delusional disorders: Secondary | ICD-10-CM

## 2016-02-17 NOTE — Progress Notes (Signed)
MRN: [id number] Name: [name]  Sex: female Age: 60 y.o. DOB: 1956-05-17  [place] #: Karren Burly Facility/Room: Level Of Care: SNF Provider: Inocencio Homes D Emergency Contacts: Extended Emergency Contact Information Primary Emergency Contact: Lape,[name] Address: [place], [place] [address] [place] Phone: [phone] Relation: Spouse Secondary Emergency Contact: Haynes Dage  [place] Phone: [phone] Mobile Phone: [phone] Relation: Sister  Code Status:   Allergies: Review of patient's allergies indicates no known allergies.  Chief Complaint  Patient presents with  . Medical Management of Chronic Issues    HPI: Patient is 60 y.o. female who is being seen for routine issues of ESRD, psychosis and PAD.  Past Medical History  Diagnosis Date  . Hyperlipidemia   . Hypertension     a. Has previously refused blood pressure meds.   . Deaf     USE SIGN INTERPRETER.  Divorced from husband but lives with him. Has daughter but she does not care for her.  . Bilateral leg edema     a. Chronic.  Marland Kitchen Psychiatric disorder     She frequently exhibits paranoia and has been diagnosed with psychotic d/o NOS during hospital stay in the past. She is tangetial and perseverative during her visits. She apparently has had a bad experience with mental health in [place] in the past and refuses to discuss mental issues for fear that she will be sent back there. Her paranoia, communication issues, financial woes and lack of fam  . Fibroid uterus   . Anemia     Due to fibroids.  . Heart murmur   . Diabetes mellitus     a. Per PCP note 2012 (A1C 9.2) - pt unwilling to take meds and was educated on risk of uncontrolled DM.  b. A1C 5.9 in 11/2013.  Marland Kitchen Coronary artery disease   . Blind   . ESRD on hemodialysis ([place])     Mon, Wed, Fri  . CHF (congestive heart failure) ([place])   . MI (myocardial infarction) ([place]) 11/2014  .  Essential hypertension 08/09/2009    Qualifier: Diagnosis of  By: Sarita Haver  MD, Coralyn Mark    . Osteomyelitis ([place])   . Cardiac arrest ([place]) 12/12/2014  . Pulmonary hypertension ([place])   . Moderate tricuspid regurgitation   . Leiomyoma of uterus 08/09/2009    Qualifier: Diagnosis of  By: Sarita Haver  MD, Coralyn Mark      Past Surgical History  Procedure Laterality Date  . No past surgeries    . Insertion of dialysis catheter Right 01/02/2014    Procedure: INSERTION OF DIALYSIS CATHETER;  Surgeon: Rosetta Posner, MD;  Location: [facility];  Service: Vascular;  Laterality: Right;  . Av fistula placement Left 01/02/2014    Procedure: ARTERIOVENOUS (AV) FISTULA CREATION;  Surgeon: Rosetta Posner, MD;  Location: [facility];  Service: Vascular;  Laterality: Left;  . Amputation Bilateral 04/02/2015    Procedure: Amputation Bilateral Great Toes MTP Joint;  Surgeon: Newt Minion, MD;  Location: [facility];  Service: Orthopedics;  Laterality: Bilateral;  . Amputation Left 05/14/2015    Procedure: Left Transmetatarsal Amputation;  Surgeon: Newt Minion, MD;  Location: [facility];  Service: Orthopedics;  Laterality: Left;  . Orif patella Left 05/17/2015    Procedure: OPEN REDUCTION INTERNAL (ORIF) FIXATION PATELLA;  Surgeon: Newt Minion, MD;  Location: [facility];  Service: Orthopedics;  Laterality: Left;  . Amputation Right 08/20/2015  Procedure: Transmetatarsal Amputation Right Foot;  Surgeon: Newt Minion, MD;  Location: [facility];  Service: Orthopedics;  Laterality: Right;      Medication List       This list is accurate as of: 02/17/16 11:59 PM.  Always use your most recent med list.               amiodarone 200 MG tablet  Commonly known as:  PACERONE  Take 200 mg by mouth daily.     calcium acetate 667 MG capsule  Commonly known as:  PHOSLO  Take 1 capsule (667 mg total) by mouth 3 (three) times daily with meals.     clopidogrel 75 MG tablet  Commonly known as:  PLAVIX  Take 1 tablet (75 mg total) by mouth daily.      metoprolol succinate 50 MG 24 hr tablet  Commonly known as:  TOPROL-XL  Take 1 tablet (50 mg total) by mouth daily. Take with or immediately following a meal.     nitroGLYCERIN 0.4 MG SL tablet  Commonly known as:  NITROSTAT  Place 0.4 mg under the tongue every 5 (five) minutes as needed for chest pain.     senna 8.6 MG Tabs tablet  Commonly known as:  SENOKOT  Take 1 tablet by mouth 2 (two) times daily as needed for mild constipation.        No orders of the defined types were placed in this encounter.    Immunization History  Administered Date(s) Administered  . Influenza,inj,Quad PF,36+ Mos 01/03/2014  . Influenza-Unspecified 09/24/2014  . PPD Test 07/28/2014  . Pneumococcal Polysaccharide-23 01/03/2014    Social History  Substance Use Topics  . Smoking status: Never Smoker   . Smokeless tobacco: Not on file     Comment: Prior dip  . Alcohol Use: No    Review of Systems UTO, pt is not participating today; nursing without new concerns    Filed Vitals:   02/22/16 1640  BP: 112/77  Pulse: 86  Temp: 97.9 F (36.6 C)  Resp: 20    Physical Exam  GENERAL APPEARANCE: Alert,  BFNo acute distress  SKIN: No diaphoresis rash HEENT: pt is blind RESPIRATORY: Breathing is even, unlabored. Lung sounds are clear   CARDIOVASCULAR: Heart RRR no murmurs, rubs or gallops. No peripheral edema;shunt LUE with thrill GASTROINTESTINAL: Abdomen is soft, non-tender, not distended w/ normal bowel sounds.  GENITOURINARY: Bladder non tender, not distended  MUSCULOSKELETAL: B transmetatarsal amputatonsi NEUROLOGIC: Cranial nerves 2-12 grossly intact. Moves all extremities PSYCHIATRIC: Mood and affect appropriate to situation, no behavioral issues  Patient Active Problem List   Diagnosis Date Noted  . Chronic combined systolic and diastolic congestive heart failure ([place]) 12/15/2015  . Dyslipidemia associated with type 2 diabetes mellitus ([place]) 12/15/2015  . Ventricular tachycardia,  sustained ([place]) 12/15/2015  . Psychosis, paranoid ([place]) 08/20/2015  . Non compliance w medication regimen 08/20/2015  . Patellar fracture 05/16/2015  . Acute respiratory failure ([place]) 04/20/2015  . ESRD (end stage renal disease) ([place]) 04/19/2015  . PAD (peripheral artery disease) ([place]) 03/23/2015  . FTT (failure to thrive) in adult 01/14/2015  . Blindness with deafness 12/16/2014  . Anemia in CKD (chronic kidney disease) 12/14/2014  . Cardiomyopathy, ischemic-EF 30-35% 11/21/2014  . NSTEMI (non-ST elevated myocardial infarction) ([place]) 11/20/2014  . Constipation 12/24/2013  . Diabetes mellitus with ESRD (end-stage renal disease) ([place]) 12-02-2013  . Hypertensive heart disease with CHF (congestive heart failure) ([place]) 08/09/2009    CBC  Component Value Date/Time   WBC 6.0 08/23/2015 0945   WBC 7.2 09/02/2014   RBC 3.23* 08/23/2015 0945   HGB 9.6* 08/23/2015 0945   HCT 29.3* 08/23/2015 0945   PLT 206 08/23/2015 0945   MCV 90.7 08/23/2015 0945   LYMPHSABS 1.1 08/23/2015 0945   MONOABS 0.8 08/23/2015 0945   EOSABS 0.6 08/23/2015 0945   BASOSABS 0.1 08/23/2015 0945    CMP     Component Value Date/Time   NA 131* 08/23/2015 0946   K 4.5 08/23/2015 0946   CL 96* 08/23/2015 0946   CO2 26 08/23/2015 0946   GLUCOSE 97 08/23/2015 0946   BUN 45* 08/23/2015 0946   BUN 64* 09/02/2014   CREATININE 6.04* 08/23/2015 0946   CREATININE 5.4* 09/02/2014   CREATININE 0.76 05/23/2011 0956   CALCIUM 8.5* 08/23/2015 0946   PROT 7.7 08/19/2015 1838   ALBUMIN 2.9* 08/23/2015 0946   AST 15 08/19/2015 1838   ALT 16 08/19/2015 1838   ALKPHOS 117 08/19/2015 1838   BILITOT 0.8 08/19/2015 1838   GFRNONAA 7* 08/23/2015 0946   GFRAA 8* 08/23/2015 0946    Assessment and Plan  ESRD (end stage renal disease) followed by nephrology. Will continue dialysis three times weekly. Will continue phoslo 667 mg daily is on 1200 cc fluid restriction will monitor   Psychosis, paranoid followed by  nephrology. Will continue dialysis three times weekly. Will continue phoslo 667 mg daily is on 1200 cc fluid restriction will monitor   PAD (peripheral artery disease) LE monitoring for breakdowns    Hennie Duos, MD

## 2016-02-18 DIAGNOSIS — N186 End stage renal disease: Secondary | ICD-10-CM | POA: Diagnosis not present

## 2016-02-18 DIAGNOSIS — N2581 Secondary hyperparathyroidism of renal origin: Secondary | ICD-10-CM | POA: Diagnosis not present

## 2016-02-18 DIAGNOSIS — D631 Anemia in chronic kidney disease: Secondary | ICD-10-CM | POA: Diagnosis not present

## 2016-02-18 DIAGNOSIS — E1129 Type 2 diabetes mellitus with other diabetic kidney complication: Secondary | ICD-10-CM | POA: Diagnosis not present

## 2016-02-21 DIAGNOSIS — E1129 Type 2 diabetes mellitus with other diabetic kidney complication: Secondary | ICD-10-CM | POA: Diagnosis not present

## 2016-02-21 DIAGNOSIS — D631 Anemia in chronic kidney disease: Secondary | ICD-10-CM | POA: Diagnosis not present

## 2016-02-21 DIAGNOSIS — N186 End stage renal disease: Secondary | ICD-10-CM | POA: Diagnosis not present

## 2016-02-21 DIAGNOSIS — N2581 Secondary hyperparathyroidism of renal origin: Secondary | ICD-10-CM | POA: Diagnosis not present

## 2016-02-22 ENCOUNTER — Ambulatory Visit: Payer: Medicare Other | Admitting: Podiatry

## 2016-02-22 DIAGNOSIS — N186 End stage renal disease: Secondary | ICD-10-CM | POA: Diagnosis not present

## 2016-02-22 DIAGNOSIS — Z992 Dependence on renal dialysis: Secondary | ICD-10-CM | POA: Diagnosis not present

## 2016-02-22 DIAGNOSIS — E1129 Type 2 diabetes mellitus with other diabetic kidney complication: Secondary | ICD-10-CM | POA: Diagnosis not present

## 2016-02-22 NOTE — Assessment & Plan Note (Signed)
followed by nephrology. Will continue dialysis three times weekly. Will continue phoslo 667 mg daily is on 1200 cc fluid restriction will monitor

## 2016-02-22 NOTE — Assessment & Plan Note (Signed)
LE monitoring for breakdowns

## 2016-02-23 DIAGNOSIS — N2581 Secondary hyperparathyroidism of renal origin: Secondary | ICD-10-CM | POA: Diagnosis not present

## 2016-02-23 DIAGNOSIS — E1129 Type 2 diabetes mellitus with other diabetic kidney complication: Secondary | ICD-10-CM | POA: Diagnosis not present

## 2016-02-23 DIAGNOSIS — N186 End stage renal disease: Secondary | ICD-10-CM | POA: Diagnosis not present

## 2016-02-25 DIAGNOSIS — N2581 Secondary hyperparathyroidism of renal origin: Secondary | ICD-10-CM | POA: Diagnosis not present

## 2016-02-25 DIAGNOSIS — N186 End stage renal disease: Secondary | ICD-10-CM | POA: Diagnosis not present

## 2016-02-25 DIAGNOSIS — E1129 Type 2 diabetes mellitus with other diabetic kidney complication: Secondary | ICD-10-CM | POA: Diagnosis not present

## 2016-02-28 DIAGNOSIS — E1129 Type 2 diabetes mellitus with other diabetic kidney complication: Secondary | ICD-10-CM | POA: Diagnosis not present

## 2016-02-28 DIAGNOSIS — N2581 Secondary hyperparathyroidism of renal origin: Secondary | ICD-10-CM | POA: Diagnosis not present

## 2016-02-28 DIAGNOSIS — N186 End stage renal disease: Secondary | ICD-10-CM | POA: Diagnosis not present

## 2016-03-01 DIAGNOSIS — E1129 Type 2 diabetes mellitus with other diabetic kidney complication: Secondary | ICD-10-CM | POA: Diagnosis not present

## 2016-03-01 DIAGNOSIS — N186 End stage renal disease: Secondary | ICD-10-CM | POA: Diagnosis not present

## 2016-03-01 DIAGNOSIS — N2581 Secondary hyperparathyroidism of renal origin: Secondary | ICD-10-CM | POA: Diagnosis not present

## 2016-03-03 DIAGNOSIS — N2581 Secondary hyperparathyroidism of renal origin: Secondary | ICD-10-CM | POA: Diagnosis not present

## 2016-03-03 DIAGNOSIS — N186 End stage renal disease: Secondary | ICD-10-CM | POA: Diagnosis not present

## 2016-03-03 DIAGNOSIS — E1129 Type 2 diabetes mellitus with other diabetic kidney complication: Secondary | ICD-10-CM | POA: Diagnosis not present

## 2016-03-06 DIAGNOSIS — N186 End stage renal disease: Secondary | ICD-10-CM | POA: Diagnosis not present

## 2016-03-06 DIAGNOSIS — E1129 Type 2 diabetes mellitus with other diabetic kidney complication: Secondary | ICD-10-CM | POA: Diagnosis not present

## 2016-03-06 DIAGNOSIS — N2581 Secondary hyperparathyroidism of renal origin: Secondary | ICD-10-CM | POA: Diagnosis not present

## 2016-03-08 DIAGNOSIS — N2581 Secondary hyperparathyroidism of renal origin: Secondary | ICD-10-CM | POA: Diagnosis not present

## 2016-03-08 DIAGNOSIS — E1129 Type 2 diabetes mellitus with other diabetic kidney complication: Secondary | ICD-10-CM | POA: Diagnosis not present

## 2016-03-08 DIAGNOSIS — N186 End stage renal disease: Secondary | ICD-10-CM | POA: Diagnosis not present

## 2016-03-10 DIAGNOSIS — N186 End stage renal disease: Secondary | ICD-10-CM | POA: Diagnosis not present

## 2016-03-10 DIAGNOSIS — E1129 Type 2 diabetes mellitus with other diabetic kidney complication: Secondary | ICD-10-CM | POA: Diagnosis not present

## 2016-03-10 DIAGNOSIS — N2581 Secondary hyperparathyroidism of renal origin: Secondary | ICD-10-CM | POA: Diagnosis not present

## 2016-03-13 DIAGNOSIS — N2581 Secondary hyperparathyroidism of renal origin: Secondary | ICD-10-CM | POA: Diagnosis not present

## 2016-03-13 DIAGNOSIS — E1129 Type 2 diabetes mellitus with other diabetic kidney complication: Secondary | ICD-10-CM | POA: Diagnosis not present

## 2016-03-13 DIAGNOSIS — N186 End stage renal disease: Secondary | ICD-10-CM | POA: Diagnosis not present

## 2016-03-16 DIAGNOSIS — F209 Schizophrenia, unspecified: Secondary | ICD-10-CM | POA: Diagnosis not present

## 2016-03-16 DIAGNOSIS — F329 Major depressive disorder, single episode, unspecified: Secondary | ICD-10-CM | POA: Diagnosis not present

## 2016-03-18 DIAGNOSIS — N186 End stage renal disease: Secondary | ICD-10-CM | POA: Diagnosis not present

## 2016-03-18 DIAGNOSIS — N2581 Secondary hyperparathyroidism of renal origin: Secondary | ICD-10-CM | POA: Diagnosis not present

## 2016-03-18 DIAGNOSIS — E1129 Type 2 diabetes mellitus with other diabetic kidney complication: Secondary | ICD-10-CM | POA: Diagnosis not present

## 2016-03-20 DIAGNOSIS — N186 End stage renal disease: Secondary | ICD-10-CM | POA: Diagnosis not present

## 2016-03-20 DIAGNOSIS — E1129 Type 2 diabetes mellitus with other diabetic kidney complication: Secondary | ICD-10-CM | POA: Diagnosis not present

## 2016-03-20 DIAGNOSIS — N2581 Secondary hyperparathyroidism of renal origin: Secondary | ICD-10-CM | POA: Diagnosis not present

## 2016-03-22 ENCOUNTER — Non-Acute Institutional Stay (SKILLED_NURSING_FACILITY): Payer: Medicare Other | Admitting: Adult Health

## 2016-03-22 ENCOUNTER — Encounter: Payer: Self-pay | Admitting: Adult Health

## 2016-03-22 DIAGNOSIS — F22 Delusional disorders: Secondary | ICD-10-CM | POA: Diagnosis not present

## 2016-03-22 DIAGNOSIS — E1122 Type 2 diabetes mellitus with diabetic chronic kidney disease: Secondary | ICD-10-CM | POA: Diagnosis not present

## 2016-03-22 DIAGNOSIS — I472 Ventricular tachycardia, unspecified: Secondary | ICD-10-CM

## 2016-03-22 DIAGNOSIS — R627 Adult failure to thrive: Secondary | ICD-10-CM

## 2016-03-22 DIAGNOSIS — I5042 Chronic combined systolic (congestive) and diastolic (congestive) heart failure: Secondary | ICD-10-CM

## 2016-03-22 DIAGNOSIS — I110 Hypertensive heart disease with heart failure: Secondary | ICD-10-CM | POA: Diagnosis not present

## 2016-03-22 DIAGNOSIS — N2581 Secondary hyperparathyroidism of renal origin: Secondary | ICD-10-CM | POA: Diagnosis not present

## 2016-03-22 DIAGNOSIS — E1129 Type 2 diabetes mellitus with other diabetic kidney complication: Secondary | ICD-10-CM | POA: Diagnosis not present

## 2016-03-22 DIAGNOSIS — N186 End stage renal disease: Secondary | ICD-10-CM

## 2016-03-22 NOTE — Progress Notes (Signed)
Patient ID: [name], female   DOB: 1956/02/24, 60 y.o.   MRN: [id number]   Facility:  [facility]       No Known Allergies  Chief Complaint  Patient presents with  . Medical Management of Chronic Issues    Follow up    HPI:  She is a Vu term resident of this facility being seen for the management of her chronic illnesses. She continues to struggle with her psychosis. She is unable to participate in the hpi or ros. There are no nursing concerns at this time. She will decline dialysis at times.   Past Medical History  Diagnosis Date  . Hyperlipidemia   . Hypertension     a. Has previously refused blood pressure meds.   . Deaf     USE SIGN INTERPRETER.  Divorced from husband but lives with him. Has daughter but she does not care for her.  . Bilateral leg edema     a. Chronic.  Marland Kitchen Psychiatric disorder     She frequently exhibits paranoia and has been diagnosed with psychotic d/o NOS during hospital stay in the past. She is tangetial and perseverative during her visits. She apparently has had a bad experience with mental health in [place] in the past and refuses to discuss mental issues for fear that she will be sent back there. Her paranoia, communication issues, financial woes and lack of fam  . Fibroid uterus   . Anemia     Due to fibroids.  . Heart murmur   . Diabetes mellitus     a. Per PCP note 2012 (A1C 9.2) - pt unwilling to take meds and was educated on risk of uncontrolled DM.  b. A1C 5.9 in 11/2013.  Marland Kitchen Coronary artery disease   . Blind   . ESRD on hemodialysis ([place])     Mon, Wed, Fri  . CHF (congestive heart failure) ([place])   . MI (myocardial infarction) ([place]) 11/2014  . Essential hypertension 08/09/2009    Qualifier: Diagnosis of  By: Sarita Haver  MD, Coralyn Mark    . Osteomyelitis ([place])   . Cardiac arrest ([place]) 12/12/2014  . Pulmonary hypertension ([place])   . Moderate tricuspid regurgitation   . Leiomyoma of uterus 08/09/2009    Qualifier: Diagnosis of  By: Sarita Haver  MD,  Coralyn Mark      Past Surgical History  Procedure Laterality Date  . No past surgeries    . Insertion of dialysis catheter Right 01/02/2014    Procedure: INSERTION OF DIALYSIS CATHETER;  Surgeon: Rosetta Posner, MD;  Location: [facility];  Service: Vascular;  Laterality: Right;  . Av fistula placement Left 01/02/2014    Procedure: ARTERIOVENOUS (AV) FISTULA CREATION;  Surgeon: Rosetta Posner, MD;  Location: [facility];  Service: Vascular;  Laterality: Left;  . Amputation Bilateral 04/02/2015    Procedure: Amputation Bilateral Great Toes MTP Joint;  Surgeon: Newt Minion, MD;  Location: [facility];  Service: Orthopedics;  Laterality: Bilateral;  . Amputation Left 05/14/2015    Procedure: Left Transmetatarsal Amputation;  Surgeon: Newt Minion, MD;  Location: [facility];  Service: Orthopedics;  Laterality: Left;  . Orif patella Left 05/17/2015    Procedure: OPEN REDUCTION INTERNAL (ORIF) FIXATION PATELLA;  Surgeon: Newt Minion, MD;  Location: [facility];  Service: Orthopedics;  Laterality: Left;  . Amputation Right 08/20/2015    Procedure: Transmetatarsal Amputation Right Foot;  Surgeon: Newt Minion, MD;  Location: [facility];  Service: Orthopedics;  Laterality: Right;  VITAL SIGNS BP 120/60 mmHg  Pulse 84  Temp(Src) 97.8 F (36.6 C) (Oral)  Resp 20  Ht _0  (1.727 m)  Wt 152 lb 9 oz (69.202 kg)  BMI 23.20 kg/m2  SpO2 98%  Patient's Medications  New Prescriptions   No medications on file  Previous Medications   AMIODARONE (PACERONE) 200 MG TABLET    Take 200 mg by mouth daily.   ARIPIPRAZOLE (ABILIFY) 2 MG TABLET    Take 2 mg by mouth daily.   CALCIUM ACETATE (PHOSLO) 667 MG CAPSULE    Take 1 capsule (667 mg total) by mouth 3 (three) times daily with meals.   CLOPIDOGREL (PLAVIX) 75 MG TABLET    Take 1 tablet (75 mg total) by mouth daily.   METOPROLOL SUCCINATE (TOPROL-XL) 50 MG 24 HR TABLET    Take 1 tablet (50 mg total) by mouth daily. Take with or immediately following a meal.   NITROGLYCERIN (NITROSTAT) 0.4 MG SL  TABLET    Place 0.4 mg under the tongue every 5 (five) minutes as needed for chest pain.   NUTRITIONAL SUPPLEMENTS (NUTRITIONAL SUPPLEMENT PO)    Take 120 mLs by mouth 3 (three) times daily.   POLYETHYLENE GLYCOL (MIRALAX / GLYCOLAX) PACKET    Take 17 g by mouth daily as needed.  Modified Medications   No medications on file  Discontinued Medications     SIGNIFICANT DIAGNOSTIC EXAMS  11-24-14: Myoview: 1. Fixed defects involving the apex, anterior lateral wall and the inferior wall. The apex and anterior lateral wall fixed defects aresuggestive for an infarct. No evidence for reversibility or ischemia. 2. Diffuse hypokinesia and minimal wall motion along the lateral wall. 3. Left ventricular ejection fraction is 35%. 4. High-risk stress test findings*.  04-16-15: 2-d echo: - Left ventricle: The cavity size was mildly dilated. Wall thickness was increased in a pattern of mild LVH. Systolic function was moderately to severely reduced. The estimated ejection fraction was in the range of 30% to 35%. There is akinesis of the anteroseptal and apical myocardium. Features are consistent with a pseudonormal left ventricular filling pattern, with concomitant abnormal relaxation and increased filling pressure (grade 2 diastolic dysfunction). Doppler parameters are consistent with high ventricular filling pressure. - Mitral valve: Calcified annulus. There was mild regurgitation. - Left atrium: The atrium was mildly dilated. - Pulmonary arteries: Systolic pressure was mildly increased.   04-20-15: chest x-ray: Mild congestive heart failure suspected. Small focal opacity right upper lung field favored to be due to atelectasis or early airspace disease secondary to edema. Suggest attention on follow-up.  05-16-15: left knee x-ray: Comminuted fracture involving the lower pole of the patella, with approximately 3 cm of separation at the fracture, and diffuse overlying soft tissue swelling. Associated moderate to  large knee joint effusion noted.  05-16-15: left hip and pelvis x-ray: 1. No evidence of fracture or dislocation. 2. Calcified fibroids noted.      LABS REVIEWED: she will decline labs   04-19-15: wbc 9.5; hgb 10.3; hct 31.9; mcv 94.4; plt 400 glucose 108; bun 22; creat 3.75; k+3.6; na++135; ast 38; alt 52; alk phos 183; albumin 3.1; tsh 1.839; hgb a1c 5.4 05-18-15: wbc 9.0; hgb 11.3; hct 36.0; mcv 95.0; plt 485; glucose 91; bun 34; creat 5.34; k+5.1; na++134 July 2016: hgb a1c 5.5 08-21-15: wbc 7.8; hgb 9.9; hct 31.;1 mcv 89.6; plt 200; glucose 104; bun 44; creat 6.23; k+4.6; na++129; phos 4.7; albumin 3.3 08-23-15: wbc 6.0; hgb 9.6; hct 29.3; mcv 90.7; plt  206; glucose 97; bun 45; creat 6.04; k+4.5; na++131; phos 3.4; albumin 2.9        Review of Systems Unable to perform ROS: Other    Physical Exam Constitutional: She appears well-developed and well-nourished. No distress.  Eyes: Conjunctivae are normal.  Neck: Neck supple. No JVD present. No thyromegaly present.  Cardiovascular: Normal rate, regular rhythm and intact distal pulses.  Murmur   Respiratory: Effort normal and breath sounds normal. No respiratory distress. She has no wheezes.  GI: Soft. Bowel sounds are normal. She exhibits no distension. There is no tenderness.  Musculoskeletal: She exhibits no edema.  Able to move all extremities  Has splint on left leg Is status post left transmetatarsal amputation Has right transmetatarsal amputation; Lymphadenopathy:    She has no cervical adenopathy.  Neurological: She is alert.  Skin: Skin is warm and dry. She is not diaphoretic.  Left upper a/v fistula: +thril +bruit       ASSESSMENT/ PLAN:  1. ESRD on hemodialysis: is followed by nephrology. Will continue dialysis three times weekly. Will continue phoslo 667 mg three times  daily is on 1200 cc fluid restriction will monitor  2. Hypertension: is presently stable will continue toprol xl 50 mg daily   3. PAD: is  without change is status post left and right transmetatarsal amputation: will continue plavix 75 mg daily   4. Combine systolic and diastolic heart failure: EF is 30-35%; is presently stable will continue toprol xl 50 mg daily   5. Sustained V tach: not a candidate for ICD. Is on amiodarone 200 mg daily for heart rate control will monitor   6. FTT: her current weight is 152 pounds; her albumin is 2.9. Will continue supplements per facility protocol.   7. Psychosis; paranoid : will continue abilify 2 mg daily . Will monitor   8. Constipation: will continue miralax daily as needed    Time spent with patient  40  minutes >50% time spent counseling; reviewing medical record; tests; labs; and developing future plan of care      Ok Edwards NP [facility]  Contact [phone] Monday through Friday 8am- 5pm  After hours call [phone]

## 2016-03-24 DIAGNOSIS — N2581 Secondary hyperparathyroidism of renal origin: Secondary | ICD-10-CM | POA: Diagnosis not present

## 2016-03-24 DIAGNOSIS — Z992 Dependence on renal dialysis: Secondary | ICD-10-CM | POA: Diagnosis not present

## 2016-03-24 DIAGNOSIS — E1129 Type 2 diabetes mellitus with other diabetic kidney complication: Secondary | ICD-10-CM | POA: Diagnosis not present

## 2016-03-24 DIAGNOSIS — N186 End stage renal disease: Secondary | ICD-10-CM | POA: Diagnosis not present

## 2016-03-27 ENCOUNTER — Encounter (HOSPITAL_COMMUNITY): Payer: Self-pay | Admitting: Emergency Medicine

## 2016-03-27 ENCOUNTER — Emergency Department (HOSPITAL_COMMUNITY)
Admission: EM | Admit: 2016-03-27 | Discharge: 2016-03-27 | Disposition: A | Discharge status: Home / Self Care | Payer: Medicare Other | Attending: Emergency Medicine | Admitting: Emergency Medicine

## 2016-03-27 DIAGNOSIS — I509 Heart failure, unspecified: Secondary | ICD-10-CM | POA: Diagnosis not present

## 2016-03-27 DIAGNOSIS — N390 Urinary tract infection, site not specified: Secondary | ICD-10-CM

## 2016-03-27 DIAGNOSIS — N186 End stage renal disease: Secondary | ICD-10-CM | POA: Insufficient documentation

## 2016-03-27 DIAGNOSIS — R451 Restlessness and agitation: Secondary | ICD-10-CM | POA: Insufficient documentation

## 2016-03-27 DIAGNOSIS — I252 Old myocardial infarction: Secondary | ICD-10-CM | POA: Diagnosis not present

## 2016-03-27 DIAGNOSIS — E785 Hyperlipidemia, unspecified: Secondary | ICD-10-CM | POA: Diagnosis not present

## 2016-03-27 DIAGNOSIS — H9190 Unspecified hearing loss, unspecified ear: Secondary | ICD-10-CM | POA: Diagnosis not present

## 2016-03-27 DIAGNOSIS — F29 Unspecified psychosis not due to a substance or known physiological condition: Secondary | ICD-10-CM | POA: Diagnosis not present

## 2016-03-27 DIAGNOSIS — E119 Type 2 diabetes mellitus without complications: Secondary | ICD-10-CM | POA: Insufficient documentation

## 2016-03-27 DIAGNOSIS — Z7902 Long term (current) use of antithrombotics/antiplatelets: Secondary | ICD-10-CM | POA: Insufficient documentation

## 2016-03-27 DIAGNOSIS — R011 Cardiac murmur, unspecified: Secondary | ICD-10-CM | POA: Diagnosis not present

## 2016-03-27 DIAGNOSIS — F0391 Unspecified dementia with behavioral disturbance: Secondary | ICD-10-CM | POA: Diagnosis not present

## 2016-03-27 DIAGNOSIS — Z79899 Other long term (current) drug therapy: Secondary | ICD-10-CM | POA: Insufficient documentation

## 2016-03-27 DIAGNOSIS — Z86018 Personal history of other benign neoplasm: Secondary | ICD-10-CM | POA: Insufficient documentation

## 2016-03-27 DIAGNOSIS — I2510 Atherosclerotic heart disease of native coronary artery without angina pectoris: Secondary | ICD-10-CM | POA: Diagnosis not present

## 2016-03-27 DIAGNOSIS — Z862 Personal history of diseases of the blood and blood-forming organs and certain disorders involving the immune mechanism: Secondary | ICD-10-CM | POA: Diagnosis not present

## 2016-03-27 DIAGNOSIS — Z992 Dependence on renal dialysis: Secondary | ICD-10-CM | POA: Insufficient documentation

## 2016-03-27 DIAGNOSIS — F99 Mental disorder, not otherwise specified: Secondary | ICD-10-CM | POA: Diagnosis not present

## 2016-03-27 DIAGNOSIS — R070 Pain in throat: Secondary | ICD-10-CM | POA: Diagnosis not present

## 2016-03-27 DIAGNOSIS — I120 Hypertensive chronic kidney disease with stage 5 chronic kidney disease or end stage renal disease: Secondary | ICD-10-CM | POA: Insufficient documentation

## 2016-03-27 DIAGNOSIS — R300 Dysuria: Secondary | ICD-10-CM | POA: Diagnosis present

## 2016-03-27 LAB — CBC WITH DIFFERENTIAL/PLATELET
Basophils Absolute: 0.1 10*3/uL (ref 0.0–0.1)
Basophils Relative: 1 %
Eosinophils Absolute: 0.2 10*3/uL (ref 0.0–0.7)
Eosinophils Relative: 4 %
HCT: 33.9 % — ABNORMAL LOW (ref 36.0–46.0)
Hemoglobin: 10.8 g/dL — ABNORMAL LOW (ref 12.0–15.0)
Lymphocytes Relative: 33 %
Lymphs Abs: 2.1 10*3/uL (ref 0.7–4.0)
MCH: 29.6 pg (ref 26.0–34.0)
MCHC: 31.9 g/dL (ref 30.0–36.0)
MCV: 92.9 fL (ref 78.0–100.0)
Monocytes Absolute: 0.5 10*3/uL (ref 0.1–1.0)
Monocytes Relative: 8 %
Neutro Abs: 3.6 10*3/uL (ref 1.7–7.7)
Neutrophils Relative %: 54 %
Platelets: 204 10*3/uL (ref 150–400)
RBC: 3.65 MIL/uL — ABNORMAL LOW (ref 3.87–5.11)
RDW: 13.5 % (ref 11.5–15.5)
WBC: 6.5 10*3/uL (ref 4.0–10.5)

## 2016-03-27 LAB — BASIC METABOLIC PANEL
Anion gap: 16 — ABNORMAL HIGH (ref 5–15)
BUN: 71 mg/dL — ABNORMAL HIGH (ref 6–20)
CO2: 21 mmol/L — ABNORMAL LOW (ref 22–32)
Calcium: 9 mg/dL (ref 8.9–10.3)
Chloride: 99 mmol/L — ABNORMAL LOW (ref 101–111)
Creatinine, Ser: 7.51 mg/dL — ABNORMAL HIGH (ref 0.44–1.00)
GFR calc Af Amer: 6 mL/min — ABNORMAL LOW (ref >60)
GFR calc non Af Amer: 5 mL/min — ABNORMAL LOW (ref >60)
Glucose, Bld: 89 mg/dL (ref 65–99)
Potassium: 4 mmol/L (ref 3.5–5.1)
Sodium: 136 mmol/L (ref 135–145)

## 2016-03-27 LAB — URINE MICROSCOPIC-ADD ON

## 2016-03-27 LAB — URINALYSIS, ROUTINE W REFLEX MICROSCOPIC
Glucose, UA: NEGATIVE mg/dL
Ketones, ur: 15 mg/dL — AB
Nitrite: NEGATIVE
Protein, ur: >300 mg/dL — AB
Specific Gravity, Urine: 1.015 (ref 1.005–1.030)
pH: 5.5 (ref 5.0–8.0)

## 2016-03-27 MED ORDER — CEFTRIAXONE SODIUM 1 G IJ SOLR: 1.0000 g | Freq: Once | INTRAMUSCULAR | Status: DC

## 2016-03-27 MED ORDER — CEPHALEXIN 500 MG PO CAPS: 500.0000 mg | ORAL_CAPSULE | Freq: Every day | ORAL | Status: AC

## 2016-03-27 MED ORDER — LIDOCAINE HCL (PF) 1 % IJ SOLN
INTRAMUSCULAR | Status: AC
Start: 2016-03-27 — End: 2016-03-27
  Administered 2016-03-27: 5 mL
  Filled 2016-03-27: qty 5

## 2016-03-27 MED ORDER — CEFTRIAXONE SODIUM 1 G IJ SOLR
1.0000 g | Freq: Once | INTRAMUSCULAR | Status: AC
  Administered 2016-03-27: 1 g via INTRAMUSCULAR
  Filled 2016-03-27: qty 10

## 2016-03-27 NOTE — ED Notes (Addendum)
Pt saying she needed "bathroom" -- assisted on bedpan, u/a collected. repositioned in bed.

## 2016-03-27 NOTE — ED Notes (Signed)
Report given to Karen, RN

## 2016-03-27 NOTE — Discharge Instructions (Signed)
Follow up with primary doctor and for dialysis.  If you were given medicines take as directed.  If you are on coumadin or contraceptives realize their levels and effectiveness is altered by many different medicines.  If you have any reaction (rash, tongues swelling, other) to the medicines stop taking and see a physician.    If your blood pressure was elevated in the ER make sure you follow up for management with a primary doctor or return for chest pain, shortness of breath or stroke symptoms.  Please follow up as directed and return to the ER or see a physician for new or worsening symptoms.  Thank you. Filed Vitals:   03/27/16 0600 03/27/16 0615 03/27/16 0630 03/27/16 0645  BP: 130/61 131/66 138/64 126/62  Pulse: 83 80 81 78  Temp:      TempSrc:      Resp:      Height:      Weight:      SpO2: 100% 100% 99% 100%

## 2016-03-27 NOTE — ED Notes (Signed)
[facility] called for pickup and transportation to [facility].

## 2016-03-27 NOTE — ED Provider Notes (Signed)
Patient's care signed out to me to follow-up urinalysis results and arrange outpatient follow-up. Interpreter arrived to clarify details. Patient cooperative and calm in the ER. Rocephin ordered with Keflex for outpatient follow-up. Patient stable to follow-up with nephrology for dialysis.  Labs Reviewed  CBC WITH DIFFERENTIAL/PLATELET - Abnormal; Notable for the following:    RBC 3.65 (*)    Hemoglobin 10.8 (*)    HCT 33.9 (*)    All other components within normal limits  BASIC METABOLIC PANEL - Abnormal; Notable for the following:    Chloride 99 (*)    CO2 21 (*)    BUN 71 (*)    Creatinine, Ser 7.51 (*)    GFR calc non Af Amer 5 (*)    GFR calc Af Amer 6 (*)    Anion gap 16 (*)    All other components within normal limits  URINALYSIS, ROUTINE W REFLEX MICROSCOPIC (NOT AT [facility]) - Abnormal; Notable for the following:    Color, Urine AMBER (*)    APPearance CLOUDY (*)    Hgb urine dipstick SMALL (*)    Bilirubin Urine SMALL (*)    Ketones, ur 15 (*)    Protein, ur >300 (*)    Leukocytes, UA LARGE (*)    All other components within normal limits  URINE MICROSCOPIC-ADD ON - Abnormal; Notable for the following:    Squamous Epithelial / LPF 0-5 (*)    Bacteria, UA FEW (*)    All other components within normal limits    Results and differential diagnosis were discussed with the patient/parent/guardian. Xrays were independently reviewed by myself.  Close follow up outpatient was discussed, comfortable with the plan.   Medications  cefTRIAXone (ROCEPHIN) 1 g in dextrose 5 % 50 mL IVPB (not administered)    Filed Vitals:   03/27/16 0600 03/27/16 0615 03/27/16 0630 03/27/16 0645  BP: 130/61 131/66 138/64 126/62  Pulse: 83 80 81 78  Temp:      TempSrc:      Resp:      Height:      Weight:      SpO2: 100% 100% 99% 100%    Final diagnoses:  UTI (lower urinary tract infection)  Agitation       Elnora Morrison, MD 04/02/16 1659

## 2016-03-27 NOTE — ED Notes (Signed)
Awaiting hand in hand interpreter to arrive prior to completing in/out cath. Dr. Dina Rich aware and states understanding.

## 2016-03-27 NOTE — ED Notes (Signed)
Hand over hand int called - will wait until they arrive to in and out cath to explain to patient what is going on

## 2016-03-27 NOTE — ED Provider Notes (Signed)
CSN: [id number]     Arrival date & time 03/27/16  0436 History   First MD Initiated Contact with Patient 03/27/16 0454     Chief Complaint  Patient presents with  . Uncooperative     Per nursing home     (Consider location/radiation/quality/duration/timing/severity/associated sxs/prior Treatment) HPI  This is a 60 year old female with a history of hypertension, hyperlipidemia, blindness, deafness, psychiatric disorder, end-stage renal disease on dialysis Monday, Wednesday, and Friday who presents from [facility] with "behavioral issues." Per EMS report, staff at the facility reported the patient had been uncooperative for several days and refusing all intervention, vital signs, nursing care. Patient is blind and deaf and communicates by tactile sign language. It was reported that her husband passed away 2 months ago and he was "the only person who can communicate with her." Patient is unable to provide history to me but appears cooperative and has been cooperative with nursing. I did speak with the nurse that was in charge of her care. He states that she has not slept in 2 days and has refused all help from them. She did last dialyzed on Friday and does make urine. They reported that she may have been grabbing her genital area like she was in pain.  Level V caveat  Past Medical History  Diagnosis Date  . Hyperlipidemia   . Hypertension     a. Has previously refused blood pressure meds.   . Deaf     USE SIGN INTERPRETER.  Divorced from husband but lives with him. Has daughter but she does not care for her.  . Bilateral leg edema     a. Chronic.  Marland Kitchen Psychiatric disorder     She frequently exhibits paranoia and has been diagnosed with psychotic d/o NOS during hospital stay in the past. She is tangetial and perseverative during her visits. She apparently has had a bad experience with mental health in [place] in the past and refuses to discuss mental issues for fear that she will be sent  back there. Her paranoia, communication issues, financial woes and lack of fam  . Fibroid uterus   . Anemia     Due to fibroids.  . Heart murmur   . Diabetes mellitus     a. Per PCP note 2012 (A1C 9.2) - pt unwilling to take meds and was educated on risk of uncontrolled DM.  b. A1C 5.9 in 11/2013.  Marland Kitchen Coronary artery disease   . Blind   . ESRD on hemodialysis ([place])     Mon, Wed, Fri  . CHF (congestive heart failure) ([place])   . MI (myocardial infarction) ([place]) 11/2014  . Essential hypertension 08/09/2009    Qualifier: Diagnosis of  By: Sarita Haver  MD, Coralyn Mark    . Osteomyelitis ([place])   . Cardiac arrest ([place]) 12/12/2014  . Pulmonary hypertension ([place])   . Moderate tricuspid regurgitation   . Leiomyoma of uterus 08/09/2009    Qualifier: Diagnosis of  By: Sarita Haver  MD, Coralyn Mark     Past Surgical History  Procedure Laterality Date  . No past surgeries    . Insertion of dialysis catheter Right 01/02/2014    Procedure: INSERTION OF DIALYSIS CATHETER;  Surgeon: Rosetta Posner, MD;  Location: [facility];  Service: Vascular;  Laterality: Right;  . Av fistula placement Left 01/02/2014    Procedure: ARTERIOVENOUS (AV) FISTULA CREATION;  Surgeon: Rosetta Posner, MD;  Location: [place] [facility];  Service: Vascular;  Laterality: Left;  . Amputation Bilateral 04/02/2015  Procedure: Amputation Bilateral Great Toes MTP Joint;  Surgeon: Newt Minion, MD;  Location: [facility];  Service: Orthopedics;  Laterality: Bilateral;  . Amputation Left 05/14/2015    Procedure: Left Transmetatarsal Amputation;  Surgeon: Newt Minion, MD;  Location: [place];  Service: Orthopedics;  Laterality: Left;  . Orif patella Left 05/17/2015    Procedure: OPEN REDUCTION INTERNAL (ORIF) FIXATION PATELLA;  Surgeon: Newt Minion, MD;  Location: [facility];  Service: Orthopedics;  Laterality: Left;  . Amputation Right 08/20/2015    Procedure: Transmetatarsal Amputation Right Foot;  Surgeon: Newt Minion, MD;  Location: [facility];  Service: Orthopedics;  Laterality: Right;    Family History  Problem Relation Age of Onset  . Other Father     Drowned from fishing?  . Heart disease     Social History  Substance Use Topics  . Smoking status: Never Smoker   . Smokeless tobacco: None     Comment: Prior dip  . Alcohol Use: No   OB History    Gravida Para Term Preterm AB TAB SAB Ectopic Multiple Living   1 1        1      Review of Systems  Unable to perform ROS: Patient nonverbal      Allergies  Review of patient's allergies indicates no known allergies.  Home Medications   Prior to Admission medications   Medication Sig Start Date End Date Taking? Authorizing Provider  amiodarone (PACERONE) 200 MG tablet Take 200 mg by mouth daily.    Historical Provider, MD  ARIPiprazole (ABILIFY) 2 MG tablet Take 2 mg by mouth daily.    Historical Provider, MD  calcium acetate (PHOSLO) 667 MG capsule Take 1 capsule (667 mg total) by mouth 3 (three) times daily with meals. Patient taking differently: Take 667 mg by mouth daily.  01/08/14   Kelvin Cellar, MD  clopidogrel (PLAVIX) 75 MG tablet Take 1 tablet (75 mg total) by mouth daily. 11/26/14   Ripudeep Krystal Eaton, MD  metoprolol succinate (TOPROL-XL) 50 MG 24 hr tablet Take 1 tablet (50 mg total) by mouth daily. Take with or immediately following a meal. 11/26/14   Ripudeep Krystal Eaton, MD  nitroGLYCERIN (NITROSTAT) 0.4 MG SL tablet Place 0.4 mg under the tongue every 5 (five) minutes as needed for chest pain.    Historical Provider, MD  Nutritional Supplements (NUTRITIONAL SUPPLEMENT PO) Take 120 mLs by mouth 3 (three) times daily.    Historical Provider, MD  polyethylene glycol (MIRALAX / GLYCOLAX) packet Take 17 g by mouth daily as needed.    Historical Provider, MD   BP 150/70 mmHg  Pulse 82  Temp(Src) 97.5 F (36.4 C) (Axillary)  Resp 18  Ht 5\' 6"  (1.676 m)  Wt 160 lb (72.576 kg)  BMI 25.84 kg/m2  SpO2 99% Physical Exam  Constitutional: No distress.  Call, cooperative, no acute distress, resting comfortably   HENT:  Head: Normocephalic and atraumatic.  Cardiovascular: Normal rate, regular rhythm and normal heart sounds.   No murmur heard. Pulmonary/Chest: Effort normal and breath sounds normal. No respiratory distress. She has no wheezes.  Abdominal: Soft. Bowel sounds are normal. There is no tenderness.  Musculoskeletal: She exhibits no edema.  Fistula left upper extremity, positive thrill  Neurological: She is alert.  Skin: Skin is warm and dry.  Psychiatric: She has a normal mood and affect.  Nursing note and vitals reviewed.   ED Course  Procedures (including critical care time) Labs Review Labs Reviewed  CBC WITH DIFFERENTIAL/PLATELET  BASIC METABOLIC PANEL  URINALYSIS, ROUTINE W REFLEX MICROSCOPIC (NOT AT [facility])    Imaging Review No results found. I have personally reviewed and evaluated these images and lab results as part of my medical decision-making.   EKG Interpretation None      MDM   Final diagnoses:  None    Patient presents with reported agitation and uncooperative behavior. She is cooperative. Unable to communicate with the patient and awaiting sign language interpreter. Basic labwork was obtained. Will defer urinalysis until we can communicate with the patient. She is nontoxic on exam and vital signs are reassuring.    Merryl Hacker, MD 03/27/16 [phone]

## 2016-03-27 NOTE — ED Notes (Signed)
Still awaiting hand in hand interpreter to assist in patient care. Dr. Dina Rich aware.

## 2016-03-27 NOTE — ED Notes (Signed)
[facility].

## 2016-03-27 NOTE — ED Notes (Signed)
Pt here via [facility] from [facility] facility for c/o "pt being uncooperative for several days; refusing all vital signs", per nursing home staff. EMS reports pt being completely cooperative during transport. Reported pt was grabbing her vaginal area like she was in pain, however. EMS states pt's spouse (who lived in same facility) passed away x 2 months ago, and was "only person who could truly communicate with her". Pt is legally blind and deaf. Pt cooperative to this RN.

## 2016-03-27 NOTE — ED Notes (Signed)
Interpretors at bedside explaining care a d discharge to pt.

## 2016-03-27 NOTE — ED Notes (Signed)
ptar at bedside. 

## 2016-03-28 DIAGNOSIS — N2581 Secondary hyperparathyroidism of renal origin: Secondary | ICD-10-CM | POA: Diagnosis not present

## 2016-03-28 DIAGNOSIS — E1129 Type 2 diabetes mellitus with other diabetic kidney complication: Secondary | ICD-10-CM | POA: Diagnosis not present

## 2016-03-28 DIAGNOSIS — N186 End stage renal disease: Secondary | ICD-10-CM | POA: Diagnosis not present

## 2016-03-28 DIAGNOSIS — D631 Anemia in chronic kidney disease: Secondary | ICD-10-CM | POA: Diagnosis not present

## 2016-03-28 LAB — URINE CULTURE

## 2016-03-29 DIAGNOSIS — N2581 Secondary hyperparathyroidism of renal origin: Secondary | ICD-10-CM | POA: Diagnosis not present

## 2016-03-29 DIAGNOSIS — D631 Anemia in chronic kidney disease: Secondary | ICD-10-CM | POA: Diagnosis not present

## 2016-03-29 DIAGNOSIS — N186 End stage renal disease: Secondary | ICD-10-CM | POA: Diagnosis not present

## 2016-03-29 DIAGNOSIS — E1129 Type 2 diabetes mellitus with other diabetic kidney complication: Secondary | ICD-10-CM | POA: Diagnosis not present

## 2016-03-31 DIAGNOSIS — E1129 Type 2 diabetes mellitus with other diabetic kidney complication: Secondary | ICD-10-CM | POA: Diagnosis not present

## 2016-03-31 DIAGNOSIS — N186 End stage renal disease: Secondary | ICD-10-CM | POA: Diagnosis not present

## 2016-03-31 DIAGNOSIS — N2581 Secondary hyperparathyroidism of renal origin: Secondary | ICD-10-CM | POA: Diagnosis not present

## 2016-03-31 DIAGNOSIS — D631 Anemia in chronic kidney disease: Secondary | ICD-10-CM | POA: Diagnosis not present

## 2016-04-03 DIAGNOSIS — F209 Schizophrenia, unspecified: Secondary | ICD-10-CM | POA: Diagnosis not present

## 2016-04-03 DIAGNOSIS — N186 End stage renal disease: Secondary | ICD-10-CM | POA: Diagnosis not present

## 2016-04-03 DIAGNOSIS — N2581 Secondary hyperparathyroidism of renal origin: Secondary | ICD-10-CM | POA: Diagnosis not present

## 2016-04-03 DIAGNOSIS — E1129 Type 2 diabetes mellitus with other diabetic kidney complication: Secondary | ICD-10-CM | POA: Diagnosis not present

## 2016-04-03 DIAGNOSIS — D631 Anemia in chronic kidney disease: Secondary | ICD-10-CM | POA: Diagnosis not present

## 2016-04-05 DIAGNOSIS — N2581 Secondary hyperparathyroidism of renal origin: Secondary | ICD-10-CM | POA: Diagnosis not present

## 2016-04-05 DIAGNOSIS — D631 Anemia in chronic kidney disease: Secondary | ICD-10-CM | POA: Diagnosis not present

## 2016-04-05 DIAGNOSIS — E1129 Type 2 diabetes mellitus with other diabetic kidney complication: Secondary | ICD-10-CM | POA: Diagnosis not present

## 2016-04-05 DIAGNOSIS — N186 End stage renal disease: Secondary | ICD-10-CM | POA: Diagnosis not present

## 2016-04-07 DIAGNOSIS — N186 End stage renal disease: Secondary | ICD-10-CM | POA: Diagnosis not present

## 2016-04-07 DIAGNOSIS — N2581 Secondary hyperparathyroidism of renal origin: Secondary | ICD-10-CM | POA: Diagnosis not present

## 2016-04-07 DIAGNOSIS — D631 Anemia in chronic kidney disease: Secondary | ICD-10-CM | POA: Diagnosis not present

## 2016-04-07 DIAGNOSIS — E1129 Type 2 diabetes mellitus with other diabetic kidney complication: Secondary | ICD-10-CM | POA: Diagnosis not present

## 2016-04-10 DIAGNOSIS — E1129 Type 2 diabetes mellitus with other diabetic kidney complication: Secondary | ICD-10-CM | POA: Diagnosis not present

## 2016-04-10 DIAGNOSIS — N186 End stage renal disease: Secondary | ICD-10-CM | POA: Diagnosis not present

## 2016-04-10 DIAGNOSIS — D631 Anemia in chronic kidney disease: Secondary | ICD-10-CM | POA: Diagnosis not present

## 2016-04-10 DIAGNOSIS — N2581 Secondary hyperparathyroidism of renal origin: Secondary | ICD-10-CM | POA: Diagnosis not present

## 2016-04-12 DIAGNOSIS — E1129 Type 2 diabetes mellitus with other diabetic kidney complication: Secondary | ICD-10-CM | POA: Diagnosis not present

## 2016-04-12 DIAGNOSIS — N2581 Secondary hyperparathyroidism of renal origin: Secondary | ICD-10-CM | POA: Diagnosis not present

## 2016-04-12 DIAGNOSIS — D631 Anemia in chronic kidney disease: Secondary | ICD-10-CM | POA: Diagnosis not present

## 2016-04-12 DIAGNOSIS — N186 End stage renal disease: Secondary | ICD-10-CM | POA: Diagnosis not present

## 2016-04-14 DIAGNOSIS — D631 Anemia in chronic kidney disease: Secondary | ICD-10-CM | POA: Diagnosis not present

## 2016-04-14 DIAGNOSIS — E1129 Type 2 diabetes mellitus with other diabetic kidney complication: Secondary | ICD-10-CM | POA: Diagnosis not present

## 2016-04-14 DIAGNOSIS — N186 End stage renal disease: Secondary | ICD-10-CM | POA: Diagnosis not present

## 2016-04-14 DIAGNOSIS — N2581 Secondary hyperparathyroidism of renal origin: Secondary | ICD-10-CM | POA: Diagnosis not present

## 2016-04-17 DIAGNOSIS — N2581 Secondary hyperparathyroidism of renal origin: Secondary | ICD-10-CM | POA: Diagnosis not present

## 2016-04-17 DIAGNOSIS — D631 Anemia in chronic kidney disease: Secondary | ICD-10-CM | POA: Diagnosis not present

## 2016-04-17 DIAGNOSIS — E1129 Type 2 diabetes mellitus with other diabetic kidney complication: Secondary | ICD-10-CM | POA: Diagnosis not present

## 2016-04-17 DIAGNOSIS — N186 End stage renal disease: Secondary | ICD-10-CM | POA: Diagnosis not present

## 2016-04-18 ENCOUNTER — Encounter: Payer: Self-pay | Admitting: Adult Health

## 2016-04-18 ENCOUNTER — Non-Acute Institutional Stay (SKILLED_NURSING_FACILITY): Payer: Medicare Other | Admitting: Adult Health

## 2016-04-18 DIAGNOSIS — I472 Ventricular tachycardia, unspecified: Secondary | ICD-10-CM

## 2016-04-18 DIAGNOSIS — R627 Adult failure to thrive: Secondary | ICD-10-CM

## 2016-04-18 DIAGNOSIS — E1122 Type 2 diabetes mellitus with diabetic chronic kidney disease: Secondary | ICD-10-CM

## 2016-04-18 DIAGNOSIS — Z7382 Dual sensory impairment: Secondary | ICD-10-CM

## 2016-04-18 DIAGNOSIS — F22 Delusional disorders: Secondary | ICD-10-CM

## 2016-04-18 DIAGNOSIS — I5042 Chronic combined systolic (congestive) and diastolic (congestive) heart failure: Secondary | ICD-10-CM | POA: Diagnosis not present

## 2016-04-18 DIAGNOSIS — I739 Peripheral vascular disease, unspecified: Secondary | ICD-10-CM | POA: Diagnosis not present

## 2016-04-18 DIAGNOSIS — E1169 Type 2 diabetes mellitus with other specified complication: Secondary | ICD-10-CM

## 2016-04-18 DIAGNOSIS — E785 Hyperlipidemia, unspecified: Secondary | ICD-10-CM | POA: Diagnosis not present

## 2016-04-18 DIAGNOSIS — I110 Hypertensive heart disease with heart failure: Secondary | ICD-10-CM | POA: Diagnosis not present

## 2016-04-18 DIAGNOSIS — H540 Blindness, both eyes: Secondary | ICD-10-CM

## 2016-04-18 DIAGNOSIS — N186 End stage renal disease: Secondary | ICD-10-CM

## 2016-04-18 DIAGNOSIS — H547 Unspecified visual loss: Secondary | ICD-10-CM

## 2016-04-18 DIAGNOSIS — K5901 Slow transit constipation: Secondary | ICD-10-CM | POA: Diagnosis not present

## 2016-04-18 DIAGNOSIS — H9190 Unspecified hearing loss, unspecified ear: Secondary | ICD-10-CM

## 2016-04-18 NOTE — Progress Notes (Signed)
Facility:  [facility]       No Known Allergies  Chief Complaint  Patient presents with  . Medical Management of Chronic Issues    Follow up    HPI:  She is a [place] term resident of this facility being seen for the management of her chronic illnesses. Overall her status is without significant change. She is unable to participate in the hpi or ros. She is currently taking her medications at this time. There are no nursing concerns at this time.    Past Medical History  Diagnosis Date  . Hyperlipidemia   . Hypertension     a. Has previously refused blood pressure meds.   . Deaf     USE SIGN INTERPRETER.  Divorced from husband but lives with him. Has daughter but she does not care for her.  . Bilateral leg edema     a. Chronic.  Marland Kitchen Psychiatric disorder     She frequently exhibits paranoia and has been diagnosed with psychotic d/o NOS during hospital stay in the past. She is tangetial and perseverative during her visits. She apparently has had a bad experience with mental health in [place] in the past and refuses to discuss mental issues for fear that she will be sent back there. Her paranoia, communication issues, financial woes and lack of fam  . Fibroid uterus   . Anemia     Due to fibroids.  . Heart murmur   . Diabetes mellitus     a. Per PCP note 2012 (A1C 9.2) - pt unwilling to take meds and was educated on risk of uncontrolled DM.  b. A1C 5.9 in 11/2013.  Marland Kitchen Coronary artery disease   . Blind   . ESRD on hemodialysis ([place])     Mon, Wed, Fri  . CHF (congestive heart failure) ([place])   . MI (myocardial infarction) ([place]) 11/2014  . Essential hypertension 08/09/2009    Qualifier: Diagnosis of  By: Sarita Haver  MD, Coralyn Mark    . Osteomyelitis ([place])   . Cardiac arrest ([place]) 12/12/2014  . Pulmonary hypertension ([place])   . Moderate tricuspid regurgitation   . Leiomyoma of uterus 08/09/2009    Qualifier: Diagnosis of  By: Sarita Haver  MD, Coralyn Mark      Past Surgical History  Procedure  Laterality Date  . No past surgeries    . Insertion of dialysis catheter Right 01/02/2014    Procedure: INSERTION OF DIALYSIS CATHETER;  Surgeon: Rosetta Posner, MD;  Location: [place] [facility];  Service: Vascular;  Laterality: Right;  . Av fistula placement Left 01/02/2014    Procedure: ARTERIOVENOUS (AV) FISTULA CREATION;  Surgeon: Rosetta Posner, MD;  Location: [facility];  Service: Vascular;  Laterality: Left;  . Amputation Bilateral 04/02/2015    Procedure: Amputation Bilateral Great Toes MTP Joint;  Surgeon: Newt Minion, MD;  Location: [facility];  Service: Orthopedics;  Laterality: Bilateral;  . Amputation Left 05/14/2015    Procedure: Left Transmetatarsal Amputation;  Surgeon: Newt Minion, MD;  Location: [facility];  Service: Orthopedics;  Laterality: Left;  . Orif patella Left 05/17/2015    Procedure: OPEN REDUCTION INTERNAL (ORIF) FIXATION PATELLA;  Surgeon: Newt Minion, MD;  Location: [facility];  Service: Orthopedics;  Laterality: Left;  . Amputation Right 08/20/2015    Procedure: Transmetatarsal Amputation Right Foot;  Surgeon: Newt Minion, MD;  Location: [facility];  Service: Orthopedics;  Laterality: Right;    VITAL SIGNS BP 126/68 mmHg  Pulse 77  Temp(Src) 98 F (36.7  C) (Oral)  Resp 18  Ht 5\' 8"  (1.727 m)  Wt 145 lb 2 oz (65.828 kg)  BMI 22.07 kg/m2  SpO2 99%  Patient's Medications  New Prescriptions   No medications on file  Previous Medications   AMIODARONE (PACERONE) 200 MG TABLET    Take 200 mg by mouth daily.   ARIPIPRAZOLE (ABILIFY) 10 MG TABLET    Take 10 mg by mouth daily.   CALCIUM ACETATE (PHOSLO) 667 MG CAPSULE    Take 1 capsule (667 mg total) by mouth 3 (three) times daily with meals.   CLOPIDOGREL (PLAVIX) 75 MG TABLET    Take 1 tablet (75 mg total) by mouth daily.   METOPROLOL SUCCINATE (TOPROL-XL) 50 MG 24 HR TABLET    Take 1 tablet (50 mg total) by mouth daily. Take with or immediately following a meal.   NITROGLYCERIN (NITROSTAT) 0.4 MG SL TABLET    Place 0.4 mg under the tongue every 5  (five) minutes as needed for chest pain.   NUTRITIONAL SUPPLEMENTS (NUTRITIONAL SUPPLEMENT PO)    Take 120 mLs by mouth 3 (three) times daily.   POLYETHYLENE GLYCOL (MIRALAX / GLYCOLAX) PACKET    Take 17 g by mouth daily.   Modified Medications   No medications on file  Discontinued Medications     SIGNIFICANT DIAGNOSTIC EXAMS  11-24-14: Myoview: 1. Fixed defects involving the apex, anterior lateral wall and the inferior wall. The apex and anterior lateral wall fixed defects aresuggestive for an infarct. No evidence for reversibility or ischemia. 2. Diffuse hypokinesia and minimal wall motion along the lateral wall. 3. Left ventricular ejection fraction is 35%. 4. High-risk stress test findings*.  04-16-15: 2-d echo: - Left ventricle: The cavity size was mildly dilated. Wall thickness was increased in a pattern of mild LVH. Systolic function was moderately to severely reduced. The estimated ejection fraction was in the range of 30% to 35%. There is akinesis of the anteroseptal and apical myocardium. Features are consistent with a pseudonormal left ventricular filling pattern, with concomitant abnormal relaxation and increased filling pressure (grade 2 diastolic dysfunction). Doppler parameters are consistent with high ventricular filling pressure. - Mitral valve: Calcified annulus. There was mild regurgitation. - Left atrium: The atrium was mildly dilated. - Pulmonary arteries: Systolic pressure was mildly increased.   04-20-15: chest x-ray: Mild congestive heart failure suspected. Small focal opacity right upper lung field favored to be due to atelectasis or early airspace disease secondary to edema. Suggest attention on follow-up.  05-16-15: left knee x-ray: Comminuted fracture involving the lower pole of the patella, with approximately 3 cm of separation at the fracture, and diffuse overlying soft tissue swelling. Associated moderate to large knee joint effusion noted.  05-16-15: left hip  and pelvis x-ray: 1. No evidence of fracture or dislocation. 2. Calcified fibroids noted.      LABS REVIEWED: she will decline labs    05-18-15: wbc 9.0; hgb 11.3; hct 36.0; mcv 95.0; plt 485; glucose 91; bun 34; creat 5.34; k+5.1; na++134 July 2016: hgb a1c 5.5 08-21-15: wbc 7.8; hgb 9.9; hct 31.;1 mcv 89.6; plt 200; glucose 104; bun 44; creat 6.23; k+4.6; na++129; phos 4.7; albumin 3.3 08-23-15: wbc 6.0; hgb 9.6; hct 29.3; mcv 90.7; plt 206; glucose 97; bun 45; creat 6.04; k+4.5; na++131; phos 3.4; albumin 2.9   11-30-16: wbc 5.2; hgb 12.3; hct 41.2; mcv 90.8; plt 213; glucose 116; bun 53.4; creat 4.77; k+ 4.1; na++ 137; liver normal albumin 3.4  tsh 1.85; hgb a1c 6.1  Review of Systems Unable to perform ROS: Other    Physical Exam Constitutional: She appears well-developed and well-nourished. No distress.  Eyes: Conjunctivae are normal.  Neck: Neck supple. No JVD present. No thyromegaly present.  Cardiovascular: Normal rate, regular rhythm and intact distal pulses.  Murmur   Respiratory: Effort normal and breath sounds normal. No respiratory distress. She has no wheezes.  GI: Soft. Bowel sounds are normal. She exhibits no distension. There is no tenderness.  Musculoskeletal: She exhibits no edema.  Able to move all extremities  Is status post left transmetatarsal amputation Has right transmetatarsal amputation; Lymphadenopathy:    She has no cervical adenopathy.  Neurological: She is alert.  Skin: Skin is warm and dry. She is not diaphoretic.  Left upper a/v fistula: +thril +bruit       ASSESSMENT/ PLAN:  1. ESRD on hemodialysis: is followed by nephrology. Will continue dialysis three times weekly. Will continue phoslo 667 mg three times  daily restriction will monitor she does go through periods of time where she will decline treatment   2. Hypertension: is presently stable will continue toprol xl 50 mg daily   3. PAD: is without change is status post left and  right transmetatarsal amputation: will continue plavix 75 mg daily   4. Combine systolic and diastolic heart failure: EF is 30-35%; is presently stable will continue toprol xl 50 mg daily   5. Sustained V tach: not a candidate for ICD. Is on amiodarone 200 mg daily for heart rate control will monitor   6. FTT: her current weight is 145 pounds; her albumin is 3.4. Will continue supplements per facility protocol.   7. Psychosis; paranoid : will continue abilify 20 mg daily . Will monitor   8. Constipation: will continue miralax daily       Ok Edwards NP [facility]  Contact [phone] Monday through Friday 8am- 5pm  After hours call [phone]

## 2016-04-19 DIAGNOSIS — B9562 Methicillin resistant Staphylococcus aureus infection as the cause of diseases classified elsewhere: Secondary | ICD-10-CM | POA: Diagnosis not present

## 2016-04-19 DIAGNOSIS — E1129 Type 2 diabetes mellitus with other diabetic kidney complication: Secondary | ICD-10-CM | POA: Diagnosis not present

## 2016-04-19 DIAGNOSIS — N186 End stage renal disease: Secondary | ICD-10-CM | POA: Diagnosis not present

## 2016-04-19 DIAGNOSIS — N2581 Secondary hyperparathyroidism of renal origin: Secondary | ICD-10-CM | POA: Diagnosis not present

## 2016-04-19 DIAGNOSIS — D631 Anemia in chronic kidney disease: Secondary | ICD-10-CM | POA: Diagnosis not present

## 2016-04-21 DIAGNOSIS — E1129 Type 2 diabetes mellitus with other diabetic kidney complication: Secondary | ICD-10-CM | POA: Diagnosis not present

## 2016-04-21 DIAGNOSIS — N2581 Secondary hyperparathyroidism of renal origin: Secondary | ICD-10-CM | POA: Diagnosis not present

## 2016-04-21 DIAGNOSIS — D631 Anemia in chronic kidney disease: Secondary | ICD-10-CM | POA: Diagnosis not present

## 2016-04-21 DIAGNOSIS — N186 End stage renal disease: Secondary | ICD-10-CM | POA: Diagnosis not present

## 2016-04-23 DIAGNOSIS — Z992 Dependence on renal dialysis: Secondary | ICD-10-CM | POA: Diagnosis not present

## 2016-04-23 DIAGNOSIS — E1129 Type 2 diabetes mellitus with other diabetic kidney complication: Secondary | ICD-10-CM | POA: Diagnosis not present

## 2016-04-23 DIAGNOSIS — N186 End stage renal disease: Secondary | ICD-10-CM | POA: Diagnosis not present

## 2016-04-24 DIAGNOSIS — E1129 Type 2 diabetes mellitus with other diabetic kidney complication: Secondary | ICD-10-CM | POA: Diagnosis not present

## 2016-04-24 DIAGNOSIS — N2581 Secondary hyperparathyroidism of renal origin: Secondary | ICD-10-CM | POA: Diagnosis not present

## 2016-04-24 DIAGNOSIS — N186 End stage renal disease: Secondary | ICD-10-CM | POA: Diagnosis not present

## 2016-04-24 DIAGNOSIS — D631 Anemia in chronic kidney disease: Secondary | ICD-10-CM | POA: Diagnosis not present

## 2016-04-26 DIAGNOSIS — D631 Anemia in chronic kidney disease: Secondary | ICD-10-CM | POA: Diagnosis not present

## 2016-04-26 DIAGNOSIS — E1129 Type 2 diabetes mellitus with other diabetic kidney complication: Secondary | ICD-10-CM | POA: Diagnosis not present

## 2016-04-26 DIAGNOSIS — N186 End stage renal disease: Secondary | ICD-10-CM | POA: Diagnosis not present

## 2016-04-26 DIAGNOSIS — N2581 Secondary hyperparathyroidism of renal origin: Secondary | ICD-10-CM | POA: Diagnosis not present

## 2016-04-28 DIAGNOSIS — E1129 Type 2 diabetes mellitus with other diabetic kidney complication: Secondary | ICD-10-CM | POA: Diagnosis not present

## 2016-04-28 DIAGNOSIS — D631 Anemia in chronic kidney disease: Secondary | ICD-10-CM | POA: Diagnosis not present

## 2016-04-28 DIAGNOSIS — N2581 Secondary hyperparathyroidism of renal origin: Secondary | ICD-10-CM | POA: Diagnosis not present

## 2016-04-28 DIAGNOSIS — N186 End stage renal disease: Secondary | ICD-10-CM | POA: Diagnosis not present

## 2016-05-01 DIAGNOSIS — N2581 Secondary hyperparathyroidism of renal origin: Secondary | ICD-10-CM | POA: Diagnosis not present

## 2016-05-01 DIAGNOSIS — E1129 Type 2 diabetes mellitus with other diabetic kidney complication: Secondary | ICD-10-CM | POA: Diagnosis not present

## 2016-05-01 DIAGNOSIS — N186 End stage renal disease: Secondary | ICD-10-CM | POA: Diagnosis not present

## 2016-05-01 DIAGNOSIS — D631 Anemia in chronic kidney disease: Secondary | ICD-10-CM | POA: Diagnosis not present

## 2016-05-03 DIAGNOSIS — N2581 Secondary hyperparathyroidism of renal origin: Secondary | ICD-10-CM | POA: Diagnosis not present

## 2016-05-03 DIAGNOSIS — E1129 Type 2 diabetes mellitus with other diabetic kidney complication: Secondary | ICD-10-CM | POA: Diagnosis not present

## 2016-05-03 DIAGNOSIS — D631 Anemia in chronic kidney disease: Secondary | ICD-10-CM | POA: Diagnosis not present

## 2016-05-03 DIAGNOSIS — N186 End stage renal disease: Secondary | ICD-10-CM | POA: Diagnosis not present

## 2016-05-04 DIAGNOSIS — E8779 Other fluid overload: Secondary | ICD-10-CM | POA: Diagnosis not present

## 2016-05-04 DIAGNOSIS — N186 End stage renal disease: Secondary | ICD-10-CM | POA: Diagnosis not present

## 2016-05-05 DIAGNOSIS — N186 End stage renal disease: Secondary | ICD-10-CM | POA: Diagnosis not present

## 2016-05-05 DIAGNOSIS — N2581 Secondary hyperparathyroidism of renal origin: Secondary | ICD-10-CM | POA: Diagnosis not present

## 2016-05-05 DIAGNOSIS — E1129 Type 2 diabetes mellitus with other diabetic kidney complication: Secondary | ICD-10-CM | POA: Diagnosis not present

## 2016-05-05 DIAGNOSIS — D631 Anemia in chronic kidney disease: Secondary | ICD-10-CM | POA: Diagnosis not present

## 2016-05-08 DIAGNOSIS — N2581 Secondary hyperparathyroidism of renal origin: Secondary | ICD-10-CM | POA: Diagnosis not present

## 2016-05-08 DIAGNOSIS — E1129 Type 2 diabetes mellitus with other diabetic kidney complication: Secondary | ICD-10-CM | POA: Diagnosis not present

## 2016-05-08 DIAGNOSIS — D631 Anemia in chronic kidney disease: Secondary | ICD-10-CM | POA: Diagnosis not present

## 2016-05-08 DIAGNOSIS — N186 End stage renal disease: Secondary | ICD-10-CM | POA: Diagnosis not present

## 2016-05-10 DIAGNOSIS — N2581 Secondary hyperparathyroidism of renal origin: Secondary | ICD-10-CM | POA: Diagnosis not present

## 2016-05-10 DIAGNOSIS — N186 End stage renal disease: Secondary | ICD-10-CM | POA: Diagnosis not present

## 2016-05-10 DIAGNOSIS — D631 Anemia in chronic kidney disease: Secondary | ICD-10-CM | POA: Diagnosis not present

## 2016-05-10 DIAGNOSIS — E1129 Type 2 diabetes mellitus with other diabetic kidney complication: Secondary | ICD-10-CM | POA: Diagnosis not present

## 2016-05-12 DIAGNOSIS — N2581 Secondary hyperparathyroidism of renal origin: Secondary | ICD-10-CM | POA: Diagnosis not present

## 2016-05-12 DIAGNOSIS — E1129 Type 2 diabetes mellitus with other diabetic kidney complication: Secondary | ICD-10-CM | POA: Diagnosis not present

## 2016-05-12 DIAGNOSIS — D631 Anemia in chronic kidney disease: Secondary | ICD-10-CM | POA: Diagnosis not present

## 2016-05-12 DIAGNOSIS — N186 End stage renal disease: Secondary | ICD-10-CM | POA: Diagnosis not present

## 2016-05-15 DIAGNOSIS — N186 End stage renal disease: Secondary | ICD-10-CM | POA: Diagnosis not present

## 2016-05-15 DIAGNOSIS — N2581 Secondary hyperparathyroidism of renal origin: Secondary | ICD-10-CM | POA: Diagnosis not present

## 2016-05-15 DIAGNOSIS — D631 Anemia in chronic kidney disease: Secondary | ICD-10-CM | POA: Diagnosis not present

## 2016-05-15 DIAGNOSIS — E1129 Type 2 diabetes mellitus with other diabetic kidney complication: Secondary | ICD-10-CM | POA: Diagnosis not present

## 2016-05-17 ENCOUNTER — Encounter: Payer: Self-pay | Admitting: Adult Health

## 2016-05-17 ENCOUNTER — Non-Acute Institutional Stay (SKILLED_NURSING_FACILITY): Payer: Medicare Other | Admitting: Adult Health

## 2016-05-17 DIAGNOSIS — Z9114 Patient's other noncompliance with medication regimen: Secondary | ICD-10-CM | POA: Diagnosis not present

## 2016-05-17 DIAGNOSIS — H9190 Unspecified hearing loss, unspecified ear: Secondary | ICD-10-CM

## 2016-05-17 DIAGNOSIS — R627 Adult failure to thrive: Secondary | ICD-10-CM

## 2016-05-17 DIAGNOSIS — I110 Hypertensive heart disease with heart failure: Secondary | ICD-10-CM

## 2016-05-17 DIAGNOSIS — H540 Blindness, both eyes: Secondary | ICD-10-CM

## 2016-05-17 DIAGNOSIS — H547 Unspecified visual loss: Secondary | ICD-10-CM

## 2016-05-17 DIAGNOSIS — Z7382 Dual sensory impairment: Secondary | ICD-10-CM | POA: Diagnosis not present

## 2016-05-17 DIAGNOSIS — E1122 Type 2 diabetes mellitus with diabetic chronic kidney disease: Secondary | ICD-10-CM

## 2016-05-17 DIAGNOSIS — I5042 Chronic combined systolic (congestive) and diastolic (congestive) heart failure: Secondary | ICD-10-CM | POA: Diagnosis not present

## 2016-05-17 DIAGNOSIS — E1129 Type 2 diabetes mellitus with other diabetic kidney complication: Secondary | ICD-10-CM | POA: Diagnosis not present

## 2016-05-17 DIAGNOSIS — N186 End stage renal disease: Secondary | ICD-10-CM | POA: Diagnosis not present

## 2016-05-17 DIAGNOSIS — N2581 Secondary hyperparathyroidism of renal origin: Secondary | ICD-10-CM | POA: Diagnosis not present

## 2016-05-17 DIAGNOSIS — D631 Anemia in chronic kidney disease: Secondary | ICD-10-CM | POA: Diagnosis not present

## 2016-05-17 NOTE — Progress Notes (Signed)
Patient ID: [name], female   DOB: 10-15-1956, 60 y.o.   MRN: [id number]   Location:  [place] Room Number: [id number] Place of Service:  SNF (31)   CODE STATUS: Full Code  No Known Allergies  Chief Complaint  Patient presents with  . Medical Management of Chronic Issues    Follow up    HPI:  She is being seen for the management of her chronic illnesses. Overall there is little change in her status. She has been going to dialysis on a routine basis recently. She is unable to participate in the hpi or ros. There are no nursing concerns at this time.   Past Medical History  Diagnosis Date  . Hyperlipidemia   . Hypertension     a. Has previously refused blood pressure meds.   . Deaf     USE SIGN INTERPRETER.  Divorced from husband but lives with him. Has daughter but she does not care for her.  . Bilateral leg edema     a. Chronic.  Marland Kitchen Psychiatric disorder     She frequently exhibits paranoia and has been diagnosed with psychotic d/o NOS during hospital stay in the past. She is tangetial and perseverative during her visits. She apparently has had a bad experience with mental health in [place] in the past and refuses to discuss mental issues for fear that she will be sent back there. Her paranoia, communication issues, financial woes and lack of fam  . Fibroid uterus   . Anemia     Due to fibroids.  . Heart murmur   . Diabetes mellitus     a. Per PCP note 2012 (A1C 9.2) - pt unwilling to take meds and was educated on risk of uncontrolled DM.  b. A1C 5.9 in 11/2013.  Marland Kitchen Coronary artery disease   . Blind   . ESRD on hemodialysis ([place])     Mon, Wed, Fri  . CHF (congestive heart failure) ([place])   . MI (myocardial infarction) ([place]) 11/2014  . Essential hypertension 08/09/2009    Qualifier: Diagnosis of  By: Sarita Haver  MD, Coralyn Mark    . Osteomyelitis ([place])   . Cardiac arrest ([place]) 12/12/2014  . Pulmonary hypertension ([place])   . Moderate tricuspid  regurgitation   . Leiomyoma of uterus 08/09/2009    Qualifier: Diagnosis of  By: Sarita Haver  MD, Coralyn Mark      Past Surgical History  Procedure Laterality Date  . No past surgeries    . Insertion of dialysis catheter Right 01/02/2014    Procedure: INSERTION OF DIALYSIS CATHETER;  Surgeon: Rosetta Posner, MD;  Location: [place];  Service: Vascular;  Laterality: Right;  . Av fistula placement Left 01/02/2014    Procedure: ARTERIOVENOUS (AV) FISTULA CREATION;  Surgeon: Rosetta Posner, MD;  Location: [place];  Service: Vascular;  Laterality: Left;  . Amputation Bilateral 04/02/2015    Procedure: Amputation Bilateral Great Toes MTP Joint;  Surgeon: Newt Minion, MD;  Location: [place];  Service: Orthopedics;  Laterality: Bilateral;  . Amputation Left 05/14/2015    Procedure: Left Transmetatarsal Amputation;  Surgeon: Newt Minion, MD;  Location: [place];  Service: Orthopedics;  Laterality: Left;  . Orif patella Left 05/17/2015    Procedure: OPEN REDUCTION INTERNAL (ORIF) FIXATION PATELLA;  Surgeon: Newt Minion, MD;  Location: [place];  Service: Orthopedics;  Laterality: Left;  . Amputation Right 08/20/2015    Procedure: Transmetatarsal Amputation Right Foot;  Surgeon: Meridee Score  V, MD;  Location: [place];  Service: Orthopedics;  Laterality: Right;    Social History   Social History  . Marital Status: Widowed    Spouse Name: N/A  . Number of Children: N/A  . Years of Education: N/A   Occupational History  . Not on file.   Social History Main Topics  . Smoking status: Never Smoker   . Smokeless tobacco: Not on file     Comment: Prior dip  . Alcohol Use: No  . Drug Use: No  . Sexual Activity: Not on file   Other Topics Concern  . Not on file   Social History Narrative   Family History  Problem Relation Age of Onset  . Other Father     Drowned from fishing?  . Heart disease        VITAL SIGNS BP 142/78 mmHg  Pulse 68  Temp(Src) 97.4 F (36.3 C) (Oral)  Resp 18  Ht 5\' 6"  (1.676 m)  Wt 150  lb (68.04 kg)  BMI 24.22 kg/m2  SpO2 99%  Patient's Medications  New Prescriptions   No medications on file  Previous Medications   AMIODARONE (PACERONE) 200 MG TABLET    Take 200 mg by mouth daily.   ARIPIPRAZOLE (ABILIFY) 10 MG TABLET    Take 10 mg by mouth daily.   CALCIUM ACETATE (PHOSLO) 667 MG CAPSULE    Take 1 capsule (667 mg total) by mouth 3 (three) times daily with meals.   CLOPIDOGREL (PLAVIX) 75 MG TABLET    Take 1 tablet (75 mg total) by mouth daily.   METOPROLOL SUCCINATE (TOPROL-XL) 50 MG 24 HR TABLET    Take 1 tablet (50 mg total) by mouth daily. Take with or immediately following a meal.   NITROGLYCERIN (NITROSTAT) 0.4 MG SL TABLET    Place 0.4 mg under the tongue every 5 (five) minutes as needed for chest pain.   NUTRITIONAL SUPPLEMENTS (NUTRITIONAL SUPPLEMENT PO)    Take 120 mLs by mouth 3 (three) times daily.   POLYETHYLENE GLYCOL (MIRALAX / GLYCOLAX) PACKET    Take 17 g by mouth daily.   Modified Medications   No medications on file  Discontinued Medications   No medications on file     SIGNIFICANT DIAGNOSTIC EXAMS  11-24-14: Myoview: 1. Fixed defects involving the apex, anterior lateral wall and the inferior wall. The apex and anterior lateral wall fixed defects aresuggestive for an infarct. No evidence for reversibility or ischemia. 2. Diffuse hypokinesia and minimal wall motion along the lateral wall. 3. Left ventricular ejection fraction is 35%. 4. High-risk stress test findings*.  04-16-15: 2-d echo: - Left ventricle: The cavity size was mildly dilated. Wall thickness was increased in a pattern of mild LVH. Systolic function was moderately to severely reduced. The estimated ejection fraction was in the range of 30% to 35%. There is akinesis of the anteroseptal and apical myocardium. Features are consistent with a pseudonormal left ventricular filling pattern, with concomitant abnormal relaxation and increased filling pressure (grade 2 diastolic dysfunction).  Doppler parameters are consistent with high ventricular filling pressure. - Mitral valve: Calcified annulus. There was mild regurgitation. - Left atrium: The atrium was mildly dilated. - Pulmonary arteries: Systolic pressure was mildly increased.   04-20-15: chest x-ray: Mild congestive heart failure suspected. Small focal opacity right upper lung field favored to be due to atelectasis or early airspace disease secondary to edema. Suggest attention on follow-up.  05-16-15: left knee x-ray: Comminuted fracture involving the lower pole  of the patella, with approximately 3 cm of separation at the fracture, and diffuse overlying soft tissue swelling. Associated moderate to large knee joint effusion noted.  05-16-15: left hip and pelvis x-ray: 1. No evidence of fracture or dislocation. 2. Calcified fibroids noted.      LABS REVIEWED: she will decline labs    05-18-15: wbc 9.0; hgb 11.3; hct 36.0; mcv 95.0; plt 485; glucose 91; bun 34; creat 5.34; k+5.1; na++134 July 2016: hgb a1c 5.5 08-21-15: wbc 7.8; hgb 9.9; hct 31.;1 mcv 89.6; plt 200; glucose 104; bun 44; creat 6.23; k+4.6; na++129; phos 4.7; albumin 3.3 08-23-15: wbc 6.0; hgb 9.6; hct 29.3; mcv 90.7; plt 206; glucose 97; bun 45; creat 6.04; k+4.5; na++131; phos 3.4; albumin 2.9   11-30-16: wbc 5.2; hgb 12.3; hct 41.2; mcv 90.8; plt 213; glucose 116; bun 53.4; creat 4.77; k+ 4.1; na++ 137; liver normal albumin 3.4  tsh 1.85; hgb a1c 6.1       Review of Systems Unable to perform ROS: Other    Physical Exam Constitutional: She appears well-developed and well-nourished. No distress.  Eyes: Conjunctivae are normal.  Neck: Neck supple. No JVD present. No thyromegaly present.  Cardiovascular: Normal rate, regular rhythm and intact distal pulses.  Murmur   Respiratory: Effort normal and breath sounds normal. No respiratory distress. She has no wheezes.  GI: Soft. Bowel sounds are normal. She exhibits no distension. There is no tenderness.    Musculoskeletal: She exhibits no edema.  Able to move all extremities  Has bilateral  transmetatarsal amputation; Lymphadenopathy:    She has no cervical adenopathy.  Neurological: She is alert.  Skin: Skin is warm and dry. She is not diaphoretic.  Left upper a/v fistula: +thril +bruit       ASSESSMENT/ PLAN:  1. ESRD on hemodialysis: is followed by nephrology. Will continue dialysis three times weekly. Will continue phoslo 667 mg three times daily will monitor she has not had a recent time of declining dialysis   2. Hypertension: is presently stable will continue toprol xl 50 mg daily   3. PAD: is without change is status post left and right transmetatarsal amputation: will continue plavix 75 mg daily   4. Combine systolic and diastolic heart failure: EF is 30-35%; is presently stable will continue toprol xl 50 mg daily   5. Sustained V tach: not a candidate for ICD. Is on amiodarone 200 mg daily for heart rate control will monitor   6. FTT: her current weight is 150 pounds; her albumin is 3.4. Will continue supplements per facility protocol.   7. Psychosis; paranoid : will continue abilify 10 mg daily . Will monitor   8. Constipation: will continue miralax daily    Will check cbc; cmp; lipids; tsh; hgb a1c     Ok Edwards NP [facility]  Contact [phone] Monday through Friday 8am- 5pm  After hours call [phone]

## 2016-05-18 ENCOUNTER — Encounter: Payer: Self-pay | Admitting: Internal Medicine

## 2016-05-18 DIAGNOSIS — E119 Type 2 diabetes mellitus without complications: Secondary | ICD-10-CM | POA: Diagnosis not present

## 2016-05-18 DIAGNOSIS — I10 Essential (primary) hypertension: Secondary | ICD-10-CM | POA: Diagnosis not present

## 2016-05-18 DIAGNOSIS — N189 Chronic kidney disease, unspecified: Secondary | ICD-10-CM | POA: Diagnosis not present

## 2016-05-18 LAB — TSH: TSH: 3.17 u[IU]/mL (ref 0.41–5.90)

## 2016-05-18 LAB — LIPID PANEL
Cholesterol: 232 mg/dL — AB (ref 0–200)
HDL: 80 mg/dL — AB (ref 35–70)
LDL Cholesterol: 134 mg/dL
Triglycerides: 90 mg/dL (ref 40–160)

## 2016-05-18 LAB — HEPATIC FUNCTION PANEL
ALT: 19 U/L (ref 7–35)
AST: 12 U/L — AB (ref 13–35)
Alkaline Phosphatase: 65 U/L (ref 25–125)
Bilirubin, Total: 0.3 mg/dL

## 2016-05-18 LAB — CBC AND DIFFERENTIAL
HCT: 34 % — AB (ref 36–46)
Hemoglobin: 11.3 g/dL — AB (ref 12.0–16.0)
Platelets: 269 10*3/uL (ref 150–399)
WBC: 6.3 10^3/mL

## 2016-05-18 LAB — BASIC METABOLIC PANEL
BUN: 34 mg/dL — AB (ref 4–21)
Creatinine: 4.4 mg/dL — AB (ref 0.5–1.1)
Glucose: 66 mg/dL
Potassium: 4.3 mmol/L (ref 3.4–5.3)
Sodium: 137 mmol/L (ref 137–147)

## 2016-05-18 LAB — HEMOGLOBIN A1C: Hemoglobin A1C: 5.4

## 2016-05-18 NOTE — Progress Notes (Signed)
Opened in error

## 2016-05-19 DIAGNOSIS — D631 Anemia in chronic kidney disease: Secondary | ICD-10-CM | POA: Diagnosis not present

## 2016-05-19 DIAGNOSIS — E1129 Type 2 diabetes mellitus with other diabetic kidney complication: Secondary | ICD-10-CM | POA: Diagnosis not present

## 2016-05-19 DIAGNOSIS — N186 End stage renal disease: Secondary | ICD-10-CM | POA: Diagnosis not present

## 2016-05-19 DIAGNOSIS — N2581 Secondary hyperparathyroidism of renal origin: Secondary | ICD-10-CM | POA: Diagnosis not present

## 2016-05-22 DIAGNOSIS — E1129 Type 2 diabetes mellitus with other diabetic kidney complication: Secondary | ICD-10-CM | POA: Diagnosis not present

## 2016-05-22 DIAGNOSIS — D631 Anemia in chronic kidney disease: Secondary | ICD-10-CM | POA: Diagnosis not present

## 2016-05-22 DIAGNOSIS — N186 End stage renal disease: Secondary | ICD-10-CM | POA: Diagnosis not present

## 2016-05-22 DIAGNOSIS — N2581 Secondary hyperparathyroidism of renal origin: Secondary | ICD-10-CM | POA: Diagnosis not present

## 2016-05-24 DIAGNOSIS — Z992 Dependence on renal dialysis: Secondary | ICD-10-CM | POA: Diagnosis not present

## 2016-05-24 DIAGNOSIS — D631 Anemia in chronic kidney disease: Secondary | ICD-10-CM | POA: Diagnosis not present

## 2016-05-24 DIAGNOSIS — N2581 Secondary hyperparathyroidism of renal origin: Secondary | ICD-10-CM | POA: Diagnosis not present

## 2016-05-24 DIAGNOSIS — E1129 Type 2 diabetes mellitus with other diabetic kidney complication: Secondary | ICD-10-CM | POA: Diagnosis not present

## 2016-05-24 DIAGNOSIS — N186 End stage renal disease: Secondary | ICD-10-CM | POA: Diagnosis not present

## 2016-05-26 DIAGNOSIS — N186 End stage renal disease: Secondary | ICD-10-CM | POA: Diagnosis not present

## 2016-05-26 DIAGNOSIS — E1129 Type 2 diabetes mellitus with other diabetic kidney complication: Secondary | ICD-10-CM | POA: Diagnosis not present

## 2016-05-26 DIAGNOSIS — N2581 Secondary hyperparathyroidism of renal origin: Secondary | ICD-10-CM | POA: Diagnosis not present

## 2016-05-29 DIAGNOSIS — N2581 Secondary hyperparathyroidism of renal origin: Secondary | ICD-10-CM | POA: Diagnosis not present

## 2016-05-29 DIAGNOSIS — N186 End stage renal disease: Secondary | ICD-10-CM | POA: Diagnosis not present

## 2016-05-29 DIAGNOSIS — E1129 Type 2 diabetes mellitus with other diabetic kidney complication: Secondary | ICD-10-CM | POA: Diagnosis not present

## 2016-05-31 DIAGNOSIS — N186 End stage renal disease: Secondary | ICD-10-CM | POA: Diagnosis not present

## 2016-05-31 DIAGNOSIS — N2581 Secondary hyperparathyroidism of renal origin: Secondary | ICD-10-CM | POA: Diagnosis not present

## 2016-05-31 DIAGNOSIS — E1129 Type 2 diabetes mellitus with other diabetic kidney complication: Secondary | ICD-10-CM | POA: Diagnosis not present

## 2016-06-02 DIAGNOSIS — N2581 Secondary hyperparathyroidism of renal origin: Secondary | ICD-10-CM | POA: Diagnosis not present

## 2016-06-02 DIAGNOSIS — N186 End stage renal disease: Secondary | ICD-10-CM | POA: Diagnosis not present

## 2016-06-02 DIAGNOSIS — E1129 Type 2 diabetes mellitus with other diabetic kidney complication: Secondary | ICD-10-CM | POA: Diagnosis not present

## 2016-06-05 DIAGNOSIS — N186 End stage renal disease: Secondary | ICD-10-CM | POA: Diagnosis not present

## 2016-06-05 DIAGNOSIS — E1129 Type 2 diabetes mellitus with other diabetic kidney complication: Secondary | ICD-10-CM | POA: Diagnosis not present

## 2016-06-05 DIAGNOSIS — N2581 Secondary hyperparathyroidism of renal origin: Secondary | ICD-10-CM | POA: Diagnosis not present

## 2016-06-07 DIAGNOSIS — N2581 Secondary hyperparathyroidism of renal origin: Secondary | ICD-10-CM | POA: Diagnosis not present

## 2016-06-07 DIAGNOSIS — E1129 Type 2 diabetes mellitus with other diabetic kidney complication: Secondary | ICD-10-CM | POA: Diagnosis not present

## 2016-06-07 DIAGNOSIS — N186 End stage renal disease: Secondary | ICD-10-CM | POA: Diagnosis not present

## 2016-06-09 DIAGNOSIS — N2581 Secondary hyperparathyroidism of renal origin: Secondary | ICD-10-CM | POA: Diagnosis not present

## 2016-06-09 DIAGNOSIS — E1129 Type 2 diabetes mellitus with other diabetic kidney complication: Secondary | ICD-10-CM | POA: Diagnosis not present

## 2016-06-09 DIAGNOSIS — N186 End stage renal disease: Secondary | ICD-10-CM | POA: Diagnosis not present

## 2016-06-12 DIAGNOSIS — E1129 Type 2 diabetes mellitus with other diabetic kidney complication: Secondary | ICD-10-CM | POA: Diagnosis not present

## 2016-06-12 DIAGNOSIS — N186 End stage renal disease: Secondary | ICD-10-CM | POA: Diagnosis not present

## 2016-06-12 DIAGNOSIS — N2581 Secondary hyperparathyroidism of renal origin: Secondary | ICD-10-CM | POA: Diagnosis not present

## 2016-06-14 DIAGNOSIS — N186 End stage renal disease: Secondary | ICD-10-CM | POA: Diagnosis not present

## 2016-06-14 DIAGNOSIS — E1129 Type 2 diabetes mellitus with other diabetic kidney complication: Secondary | ICD-10-CM | POA: Diagnosis not present

## 2016-06-14 DIAGNOSIS — N2581 Secondary hyperparathyroidism of renal origin: Secondary | ICD-10-CM | POA: Diagnosis not present

## 2016-06-16 DIAGNOSIS — N2581 Secondary hyperparathyroidism of renal origin: Secondary | ICD-10-CM | POA: Diagnosis not present

## 2016-06-16 DIAGNOSIS — N186 End stage renal disease: Secondary | ICD-10-CM | POA: Diagnosis not present

## 2016-06-16 DIAGNOSIS — E1129 Type 2 diabetes mellitus with other diabetic kidney complication: Secondary | ICD-10-CM | POA: Diagnosis not present

## 2016-06-19 DIAGNOSIS — N186 End stage renal disease: Secondary | ICD-10-CM | POA: Diagnosis not present

## 2016-06-19 DIAGNOSIS — N2581 Secondary hyperparathyroidism of renal origin: Secondary | ICD-10-CM | POA: Diagnosis not present

## 2016-06-19 DIAGNOSIS — E1129 Type 2 diabetes mellitus with other diabetic kidney complication: Secondary | ICD-10-CM | POA: Diagnosis not present

## 2016-06-20 ENCOUNTER — Encounter: Payer: Self-pay | Admitting: Adult Health

## 2016-06-20 ENCOUNTER — Non-Acute Institutional Stay (SKILLED_NURSING_FACILITY): Payer: Medicare Other | Admitting: Adult Health

## 2016-06-20 DIAGNOSIS — I5042 Chronic combined systolic (congestive) and diastolic (congestive) heart failure: Secondary | ICD-10-CM

## 2016-06-20 DIAGNOSIS — E1169 Type 2 diabetes mellitus with other specified complication: Secondary | ICD-10-CM

## 2016-06-20 DIAGNOSIS — H9190 Unspecified hearing loss, unspecified ear: Secondary | ICD-10-CM

## 2016-06-20 DIAGNOSIS — E785 Hyperlipidemia, unspecified: Secondary | ICD-10-CM | POA: Diagnosis not present

## 2016-06-20 DIAGNOSIS — I472 Ventricular tachycardia, unspecified: Secondary | ICD-10-CM

## 2016-06-20 DIAGNOSIS — N189 Chronic kidney disease, unspecified: Secondary | ICD-10-CM | POA: Diagnosis not present

## 2016-06-20 DIAGNOSIS — D631 Anemia in chronic kidney disease: Secondary | ICD-10-CM | POA: Diagnosis not present

## 2016-06-20 DIAGNOSIS — Z7382 Dual sensory impairment: Secondary | ICD-10-CM

## 2016-06-20 DIAGNOSIS — N186 End stage renal disease: Secondary | ICD-10-CM | POA: Diagnosis not present

## 2016-06-20 DIAGNOSIS — H547 Unspecified visual loss: Secondary | ICD-10-CM

## 2016-06-20 DIAGNOSIS — I110 Hypertensive heart disease with heart failure: Secondary | ICD-10-CM | POA: Diagnosis not present

## 2016-06-20 DIAGNOSIS — E1122 Type 2 diabetes mellitus with diabetic chronic kidney disease: Secondary | ICD-10-CM | POA: Diagnosis not present

## 2016-06-20 DIAGNOSIS — H540 Blindness, both eyes: Secondary | ICD-10-CM

## 2016-06-20 NOTE — Progress Notes (Signed)
Patient ID: [name], female   DOB: 1956-12-11, 60 y.o.   MRN: [id number]   Location:     [place] Room Number: [id number] Place of Service:  SNF (31)   CODE STATUS: Full Code  No Known Allergies  Chief Complaint  Patient presents with  . Medical Management of Chronic Issues    Follow up    HPI:  She is a [place] term resident of this facility being seen for the management of her chronic illnesses. Overall there is little change in her status. She is going to hemodialysis three times per week. She is unable to fully participate in the hpi or ros. There are no nursing concerns at this time.    Past Medical History  Diagnosis Date  . Hyperlipidemia   . Hypertension     a. Has previously refused blood pressure meds.   . Deaf     USE SIGN INTERPRETER.  Divorced from husband but lives with him. Has daughter but she does not care for her.  . Bilateral leg edema     a. Chronic.  Marland Kitchen Psychiatric disorder     She frequently exhibits paranoia and has been diagnosed with psychotic d/o NOS during hospital stay in the past. She is tangetial and perseverative during her visits. She apparently has had a bad experience with mental health in [place] in the past and refuses to discuss mental issues for fear that she will be sent back there. Her paranoia, communication issues, financial woes and lack of fam  . Fibroid uterus   . Anemia     Due to fibroids.  . Heart murmur   . Diabetes mellitus     a. Per PCP note 2012 (A1C 9.2) - pt unwilling to take meds and was educated on risk of uncontrolled DM.  b. A1C 5.9 in 11/2013.  Marland Kitchen Coronary artery disease   . Blind   . ESRD on hemodialysis ([place])     Mon, Wed, Fri  . CHF (congestive heart failure) ([place])   . MI (myocardial infarction) ([place]) 11/2014  . Essential hypertension 08/09/2009    Qualifier: Diagnosis of  By: Sarita Haver  MD, Coralyn Mark    . Osteomyelitis ([place])   . Cardiac arrest ([place]) 12/12/2014  . Pulmonary hypertension ([place])   .  Moderate tricuspid regurgitation   . Leiomyoma of uterus 08/09/2009    Qualifier: Diagnosis of  By: Sarita Haver  MD, Coralyn Mark      Past Surgical History  Procedure Laterality Date  . No past surgeries    . Insertion of dialysis catheter Right 01/02/2014    Procedure: INSERTION OF DIALYSIS CATHETER;  Surgeon: Rosetta Posner, MD;  Location: [place];  Service: Vascular;  Laterality: Right;  . Av fistula placement Left 01/02/2014    Procedure: ARTERIOVENOUS (AV) FISTULA CREATION;  Surgeon: Rosetta Posner, MD;  Location: [place];  Service: Vascular;  Laterality: Left;  . Amputation Bilateral 04/02/2015    Procedure: Amputation Bilateral Great Toes MTP Joint;  Surgeon: Newt Minion, MD;  Location: [place];  Service: Orthopedics;  Laterality: Bilateral;  . Amputation Left 05/14/2015    Procedure: Left Transmetatarsal Amputation;  Surgeon: Newt Minion, MD;  Location: [place];  Service: Orthopedics;  Laterality: Left;  . Orif patella Left 05/17/2015    Procedure: OPEN REDUCTION INTERNAL (ORIF) FIXATION PATELLA;  Surgeon: Newt Minion, MD;  Location: [place];  Service: Orthopedics;  Laterality: Left;  . Amputation Right 08/20/2015    Procedure:  Transmetatarsal Amputation Right Foot;  Surgeon: Newt Minion, MD;  Location: [place];  Service: Orthopedics;  Laterality: Right;    Social History   Social History  . Marital Status: Widowed    Spouse Name: N/A  . Number of Children: N/A  . Years of Education: N/A   Occupational History  . Not on file.   Social History Main Topics  . Smoking status: Never Smoker   . Smokeless tobacco: Not on file     Comment: Prior dip  . Alcohol Use: No  . Drug Use: No  . Sexual Activity: Not on file   Other Topics Concern  . Not on file   Social History Narrative   Family History  Problem Relation Age of Onset  . Other Father     Drowned from fishing?  . Heart disease        VITAL SIGNS BP 118/72 mmHg  Pulse 68  Temp(Src) 97.6 F (36.4 C) (Oral)  Resp 18  Ht 5\' 6"   (1.676 m)  Wt 154 lb (69.854 kg)  BMI 24.87 kg/m2  SpO2 99%  Patient's Medications  New Prescriptions   No medications on file  Previous Medications   AMIODARONE (PACERONE) 200 MG TABLET    Take 200 mg by mouth daily.   ARIPIPRAZOLE (ABILIFY) 10 MG TABLET    Take 10 mg by mouth daily.   CALCIUM ACETATE (PHOSLO) 667 MG CAPSULE    Take 1 capsule (667 mg total) by mouth 3 (three) times daily with meals.   CLOPIDOGREL (PLAVIX) 75 MG TABLET    Take 1 tablet (75 mg total) by mouth daily.   METOPROLOL SUCCINATE (TOPROL-XL) 50 MG 24 HR TABLET    Take 1 tablet (50 mg total) by mouth daily. Take with or immediately following a meal.   NITROGLYCERIN (NITROSTAT) 0.4 MG SL TABLET    Place 0.4 mg under the tongue every 5 (five) minutes as needed for chest pain.   NUTRITIONAL SUPPLEMENTS (NUTRITIONAL SUPPLEMENT PO)    Take 120 mLs by mouth 3 (three) times daily.   POLYETHYLENE GLYCOL (MIRALAX / GLYCOLAX) PACKET    Take 17 g by mouth daily.   Modified Medications   No medications on file  Discontinued Medications   No medications on file     SIGNIFICANT DIAGNOSTIC EXAMS  11-24-14: Myoview: 1. Fixed defects involving the apex, anterior lateral wall and the inferior wall. The apex and anterior lateral wall fixed defects aresuggestive for an infarct. No evidence for reversibility or ischemia. 2. Diffuse hypokinesia and minimal wall motion along the lateral wall. 3. Left ventricular ejection fraction is 35%. 4. High-risk stress test findings*.  04-16-15: 2-d echo: - Left ventricle: The cavity size was mildly dilated. Wall thickness was increased in a pattern of mild LVH. Systolic function was moderately to severely reduced. The estimated ejection fraction was in the range of 30% to 35%. There is akinesis of the anteroseptal and apical myocardium. Features are consistent with a pseudonormal left ventricular filling pattern, with concomitant abnormal relaxation and increased filling pressure (grade 2  diastolic dysfunction). Doppler parameters are consistent with high ventricular filling pressure. - Mitral valve: Calcified annulus. There was mild regurgitation. - Left atrium: The atrium was mildly dilated. - Pulmonary arteries: Systolic pressure was mildly increased.   04-20-15: chest x-ray: Mild congestive heart failure suspected. Small focal opacity right upper lung field favored to be due to atelectasis or early airspace disease secondary to edema. Suggest attention on follow-up.  05-16-15: left  knee x-ray: Comminuted fracture involving the lower pole of the patella, with approximately 3 cm of separation at the fracture, and diffuse overlying soft tissue swelling. Associated moderate to large knee joint effusion noted.  05-16-15: left hip and pelvis x-ray: 1. No evidence of fracture or dislocation. 2. Calcified fibroids noted.      LABS REVIEWED: she will decline labs   08-21-15: wbc 7.8; hgb 9.9; hct 31.;1 mcv 89.6; plt 200; glucose 104; bun 44; creat 6.23; k+4.6; na++129; phos 4.7; albumin 3.3 08-23-15: wbc 6.0; hgb 9.6; hct 29.3; mcv 90.7; plt 206; glucose 97; bun 45; creat 6.04; k+4.5; na++131; phos 3.4; albumin 2.9   12-01-15: wbc 5.2; hgb 12.3; hct 41.2; mcv 90.8; plt 213; glucose 116; bun 53.4; creat 4.77; k+ 4.1; na++ 137; liver normal albumin 3.4  tsh 1.85; hgb a1c 6.1  05-17-16: wbc 6.3; hgb 11.3; hct 33.5; mcv 93.9; plt 269; glucose 66; bun 34.3; creat 4.38; k+ 4.3; na++ 139; liver normal albumin 3.9; tsh 3.17; hgb a1c 5.4; chol 232; ldl 134; trig 90; hdl 80      Review of Systems Unable to perform ROS: Other    Physical Exam Constitutional: She appears well-developed and well-nourished. No distress.  Eyes: Conjunctivae are normal.  Neck: Neck supple. No JVD present. No thyromegaly present.  Cardiovascular: Normal rate, regular rhythm and intact distal pulses.  Murmur   Respiratory: Effort normal and breath sounds normal. No respiratory distress. She has no wheezes.    GI: Soft. Bowel sounds are normal. She exhibits no distension. There is no tenderness.  Musculoskeletal: She exhibits no edema.  Able to move all extremities  Has bilateral  transmetatarsal amputation; Lymphadenopathy:    She has no cervical adenopathy.  Neurological: She is alert.  Skin: Skin is warm and dry. She is not diaphoretic.  Left upper a/v fistula: +thril +bruit       ASSESSMENT/ PLAN:  1. ESRD on hemodialysis: is followed by nephrology. Will continue dialysis three times weekly. Will continue phoslo 667 mg three times daily will monitor she has not had a recent time of declining dialysis   2. Hypertension: is presently stable will continue toprol xl 50 mg daily   3. PAD: is without change is status post left and right transmetatarsal amputation: will continue plavix 75 mg daily   4. Combine systolic and diastolic heart failure: EF is 30-35%; is presently stable will continue toprol xl 50 mg daily   5. Sustained V tach: not a candidate for ICD. Is on amiodarone 200 mg daily for heart rate control will monitor   6. FTT: her current weight is 154 pounds; her albumin is 3.9. Will continue supplements per facility protocol.   7. Psychosis; paranoid : will continue abilify 10 mg daily . Will monitor   8. Constipation: will continue miralax daily     Ok Edwards NP [facility]  Contact [phone] Monday through Friday 8am- 5pm  After hours call [phone]

## 2016-06-21 DIAGNOSIS — E1129 Type 2 diabetes mellitus with other diabetic kidney complication: Secondary | ICD-10-CM | POA: Diagnosis not present

## 2016-06-21 DIAGNOSIS — N2581 Secondary hyperparathyroidism of renal origin: Secondary | ICD-10-CM | POA: Diagnosis not present

## 2016-06-21 DIAGNOSIS — N186 End stage renal disease: Secondary | ICD-10-CM | POA: Diagnosis not present

## 2016-06-23 DIAGNOSIS — Z992 Dependence on renal dialysis: Secondary | ICD-10-CM | POA: Diagnosis not present

## 2016-06-23 DIAGNOSIS — E1129 Type 2 diabetes mellitus with other diabetic kidney complication: Secondary | ICD-10-CM | POA: Diagnosis not present

## 2016-06-23 DIAGNOSIS — N186 End stage renal disease: Secondary | ICD-10-CM | POA: Diagnosis not present

## 2016-06-23 DIAGNOSIS — N2581 Secondary hyperparathyroidism of renal origin: Secondary | ICD-10-CM | POA: Diagnosis not present

## 2016-06-26 DIAGNOSIS — N186 End stage renal disease: Secondary | ICD-10-CM | POA: Diagnosis not present

## 2016-06-26 DIAGNOSIS — D631 Anemia in chronic kidney disease: Secondary | ICD-10-CM | POA: Diagnosis not present

## 2016-06-26 DIAGNOSIS — E1129 Type 2 diabetes mellitus with other diabetic kidney complication: Secondary | ICD-10-CM | POA: Diagnosis not present

## 2016-06-26 DIAGNOSIS — N2581 Secondary hyperparathyroidism of renal origin: Secondary | ICD-10-CM | POA: Diagnosis not present

## 2016-06-28 DIAGNOSIS — D631 Anemia in chronic kidney disease: Secondary | ICD-10-CM | POA: Diagnosis not present

## 2016-06-28 DIAGNOSIS — N186 End stage renal disease: Secondary | ICD-10-CM | POA: Diagnosis not present

## 2016-06-28 DIAGNOSIS — E1129 Type 2 diabetes mellitus with other diabetic kidney complication: Secondary | ICD-10-CM | POA: Diagnosis not present

## 2016-06-28 DIAGNOSIS — N2581 Secondary hyperparathyroidism of renal origin: Secondary | ICD-10-CM | POA: Diagnosis not present

## 2016-06-30 DIAGNOSIS — E1129 Type 2 diabetes mellitus with other diabetic kidney complication: Secondary | ICD-10-CM | POA: Diagnosis not present

## 2016-06-30 DIAGNOSIS — N2581 Secondary hyperparathyroidism of renal origin: Secondary | ICD-10-CM | POA: Diagnosis not present

## 2016-06-30 DIAGNOSIS — D631 Anemia in chronic kidney disease: Secondary | ICD-10-CM | POA: Diagnosis not present

## 2016-06-30 DIAGNOSIS — N186 End stage renal disease: Secondary | ICD-10-CM | POA: Diagnosis not present

## 2016-07-03 DIAGNOSIS — N2581 Secondary hyperparathyroidism of renal origin: Secondary | ICD-10-CM | POA: Diagnosis not present

## 2016-07-03 DIAGNOSIS — D631 Anemia in chronic kidney disease: Secondary | ICD-10-CM | POA: Diagnosis not present

## 2016-07-03 DIAGNOSIS — E1129 Type 2 diabetes mellitus with other diabetic kidney complication: Secondary | ICD-10-CM | POA: Diagnosis not present

## 2016-07-03 DIAGNOSIS — N186 End stage renal disease: Secondary | ICD-10-CM | POA: Diagnosis not present

## 2016-07-05 DIAGNOSIS — N186 End stage renal disease: Secondary | ICD-10-CM | POA: Diagnosis not present

## 2016-07-05 DIAGNOSIS — E1129 Type 2 diabetes mellitus with other diabetic kidney complication: Secondary | ICD-10-CM | POA: Diagnosis not present

## 2016-07-05 DIAGNOSIS — D631 Anemia in chronic kidney disease: Secondary | ICD-10-CM | POA: Diagnosis not present

## 2016-07-05 DIAGNOSIS — N2581 Secondary hyperparathyroidism of renal origin: Secondary | ICD-10-CM | POA: Diagnosis not present

## 2016-07-07 DIAGNOSIS — E1129 Type 2 diabetes mellitus with other diabetic kidney complication: Secondary | ICD-10-CM | POA: Diagnosis not present

## 2016-07-07 DIAGNOSIS — N2581 Secondary hyperparathyroidism of renal origin: Secondary | ICD-10-CM | POA: Diagnosis not present

## 2016-07-07 DIAGNOSIS — D631 Anemia in chronic kidney disease: Secondary | ICD-10-CM | POA: Diagnosis not present

## 2016-07-07 DIAGNOSIS — N186 End stage renal disease: Secondary | ICD-10-CM | POA: Diagnosis not present

## 2016-07-10 DIAGNOSIS — E1129 Type 2 diabetes mellitus with other diabetic kidney complication: Secondary | ICD-10-CM | POA: Diagnosis not present

## 2016-07-10 DIAGNOSIS — N2581 Secondary hyperparathyroidism of renal origin: Secondary | ICD-10-CM | POA: Diagnosis not present

## 2016-07-10 DIAGNOSIS — D631 Anemia in chronic kidney disease: Secondary | ICD-10-CM | POA: Diagnosis not present

## 2016-07-10 DIAGNOSIS — N186 End stage renal disease: Secondary | ICD-10-CM | POA: Diagnosis not present

## 2016-07-12 DIAGNOSIS — D631 Anemia in chronic kidney disease: Secondary | ICD-10-CM | POA: Diagnosis not present

## 2016-07-12 DIAGNOSIS — N186 End stage renal disease: Secondary | ICD-10-CM | POA: Diagnosis not present

## 2016-07-12 DIAGNOSIS — E1129 Type 2 diabetes mellitus with other diabetic kidney complication: Secondary | ICD-10-CM | POA: Diagnosis not present

## 2016-07-12 DIAGNOSIS — N2581 Secondary hyperparathyroidism of renal origin: Secondary | ICD-10-CM | POA: Diagnosis not present

## 2016-07-14 DIAGNOSIS — N2581 Secondary hyperparathyroidism of renal origin: Secondary | ICD-10-CM | POA: Diagnosis not present

## 2016-07-14 DIAGNOSIS — D631 Anemia in chronic kidney disease: Secondary | ICD-10-CM | POA: Diagnosis not present

## 2016-07-14 DIAGNOSIS — E1129 Type 2 diabetes mellitus with other diabetic kidney complication: Secondary | ICD-10-CM | POA: Diagnosis not present

## 2016-07-14 DIAGNOSIS — N186 End stage renal disease: Secondary | ICD-10-CM | POA: Diagnosis not present

## 2016-07-17 DIAGNOSIS — F329 Major depressive disorder, single episode, unspecified: Secondary | ICD-10-CM | POA: Diagnosis not present

## 2016-07-17 DIAGNOSIS — N2581 Secondary hyperparathyroidism of renal origin: Secondary | ICD-10-CM | POA: Diagnosis not present

## 2016-07-17 DIAGNOSIS — N186 End stage renal disease: Secondary | ICD-10-CM | POA: Diagnosis not present

## 2016-07-17 DIAGNOSIS — D631 Anemia in chronic kidney disease: Secondary | ICD-10-CM | POA: Diagnosis not present

## 2016-07-17 DIAGNOSIS — F209 Schizophrenia, unspecified: Secondary | ICD-10-CM | POA: Diagnosis not present

## 2016-07-17 DIAGNOSIS — E1129 Type 2 diabetes mellitus with other diabetic kidney complication: Secondary | ICD-10-CM | POA: Diagnosis not present

## 2016-07-19 DIAGNOSIS — N2581 Secondary hyperparathyroidism of renal origin: Secondary | ICD-10-CM | POA: Diagnosis not present

## 2016-07-19 DIAGNOSIS — E1129 Type 2 diabetes mellitus with other diabetic kidney complication: Secondary | ICD-10-CM | POA: Diagnosis not present

## 2016-07-19 DIAGNOSIS — D631 Anemia in chronic kidney disease: Secondary | ICD-10-CM | POA: Diagnosis not present

## 2016-07-19 DIAGNOSIS — N186 End stage renal disease: Secondary | ICD-10-CM | POA: Diagnosis not present

## 2016-07-20 ENCOUNTER — Non-Acute Institutional Stay (SKILLED_NURSING_FACILITY): Payer: Medicare Other | Admitting: Internal Medicine

## 2016-07-20 ENCOUNTER — Encounter: Payer: Self-pay | Admitting: Internal Medicine

## 2016-07-20 DIAGNOSIS — I739 Peripheral vascular disease, unspecified: Secondary | ICD-10-CM

## 2016-07-20 DIAGNOSIS — I5042 Chronic combined systolic (congestive) and diastolic (congestive) heart failure: Secondary | ICD-10-CM

## 2016-07-20 DIAGNOSIS — I472 Ventricular tachycardia, unspecified: Secondary | ICD-10-CM

## 2016-07-20 NOTE — Progress Notes (Signed)
MRN: [id number] Name: [name]  Sex: female Age: 60 y.o. DOB: Apr 04, 1956  [place] #:  Facility/Room: [facility] / 227 A Level Of Care: SNF Provider: Noah Delaine. Sheppard Coil, MD Emergency Contacts: Extended Emergency Contact Information Primary Emergency Contact: Jarrett,[name] Address: [place], [place] [address] [place] Phone: [phone] Relation: Spouse Secondary Emergency Contact: Haynes Dage  [place] Phone: [phone] Mobile Phone: [phone] Relation: Sister  Code Status: Full Code  Allergies: Review of patient's allergies indicates no known allergies.  Chief Complaint  Patient presents with  . Medical Management of Chronic Issues    Routine Visit    HPI: Patient is 60 y.o. female who is beimg seen for routine issues of PAD, chronic s+dCHF and hx of sustained V tach.   Past Medical History:  Diagnosis Date  . Anemia    Due to fibroids.  . Bilateral leg edema    a. Chronic.  Marland Kitchen Blind   . Cardiac arrest ([place]) 12/12/2014  . CHF (congestive heart failure) ([place])   . Coronary artery disease   . Deaf    USE SIGN INTERPRETER.  Divorced from husband but lives with him. Has daughter but she does not care for her.  . Diabetes mellitus    a. Per PCP note 2012 (A1C 9.2) - pt unwilling to take meds and was educated on risk of uncontrolled DM.  b. A1C 5.9 in 11/2013.  Marland Kitchen ESRD on hemodialysis ([place])    Mon, Wed, Fri  . Essential hypertension 08/09/2009   Qualifier: Diagnosis of  By: Sarita Haver  MD, Coralyn Mark    . Fibroid uterus   . Heart murmur   . Hyperlipidemia   . Hypertension    a. Has previously refused blood pressure meds.   . Leiomyoma of uterus 08/09/2009   Qualifier: Diagnosis of  By: Sarita Haver  MD, Coralyn Mark    . MI (myocardial infarction) ([place]) 11/2014  . Moderate tricuspid regurgitation   . Osteomyelitis ([place])   . Psychiatric disorder    She frequently exhibits paranoia and has been diagnosed with  psychotic d/o NOS during hospital stay in the past. She is tangetial and perseverative during her visits. She apparently has had a bad experience with mental health in [place] in the past and refuses to discuss mental issues for fear that she will be sent back there. Her paranoia, communication issues, financial woes and lack of fam  . Pulmonary hypertension ([place])     Past Surgical History:  Procedure Laterality Date  . AMPUTATION Bilateral 04/02/2015   Procedure: Amputation Bilateral Great Toes MTP Joint;  Surgeon: Newt Minion, MD;  Location: [facility];  Service: Orthopedics;  Laterality: Bilateral;  . AMPUTATION Left 05/14/2015   Procedure: Left Transmetatarsal Amputation;  Surgeon: Newt Minion, MD;  Location: [facility];  Service: Orthopedics;  Laterality: Left;  . AMPUTATION Right 08/20/2015   Procedure: Transmetatarsal Amputation Right Foot;  Surgeon: Newt Minion, MD;  Location: [facility];  Service: Orthopedics;  Laterality: Right;  . AV FISTULA PLACEMENT Left 01/02/2014   Procedure: ARTERIOVENOUS (AV) FISTULA CREATION;  Surgeon: Rosetta Posner, MD;  Location: [facility];  Service: Vascular;  Laterality: Left;  . INSERTION OF DIALYSIS CATHETER Right 01/02/2014   Procedure: INSERTION OF DIALYSIS CATHETER;  Surgeon: Rosetta Posner, MD;  Location: [facility];  Service: Vascular;  Laterality: Right;  . NO PAST SURGERIES    . ORIF PATELLA Left 05/17/2015  Procedure: OPEN REDUCTION INTERNAL (ORIF) FIXATION PATELLA;  Surgeon: Newt Minion, MD;  Location: [facility];  Service: Orthopedics;  Laterality: Left;      Medication List       Accurate as of 07/20/16 11:44 AM. Always use your most recent med list.          amiodarone 200 MG tablet Commonly known as:  PACERONE Take 200 mg by mouth every evening.   ARIPiprazole 10 MG tablet Commonly known as:  ABILIFY Take 10 mg by mouth at bedtime.   calcium acetate 667 MG tablet Commonly known as:  PHOSLO Take 3 tablets by mouth before meals for dialysis   clopidogrel 75 MG  tablet Commonly known as:  PLAVIX Take 75 mg by mouth every evening.   metoprolol succinate 50 MG 24 hr tablet Commonly known as:  TOPROL-XL Take 50 mg by mouth every evening. Take with or immediately following a meal.   nitroGLYCERIN 0.4 MG SL tablet Commonly known as:  NITROSTAT Place 0.4 mg under the tongue every 5 (five) minutes as needed for chest pain.   polyethylene glycol packet Commonly known as:  MIRALAX / GLYCOLAX Take 17 g by mouth daily.   PROSTAT PO Take 60 g by mouth 2 (two) times daily.       Meds ordered this encounter  Medications  . Pollen Extracts (PROSTAT PO)    Sig: Take 60 g by mouth 2 (two) times daily.  . calcium acetate (PHOSLO) 667 MG tablet    Sig: Take 3 tablets by mouth before meals for dialysis  . clopidogrel (PLAVIX) 75 MG tablet    Sig: Take 75 mg by mouth every evening.  . metoprolol succinate (TOPROL-XL) 50 MG 24 hr tablet    Sig: Take 50 mg by mouth every evening. Take with or immediately following a meal.    Immunization History  Administered Date(s) Administered  . Influenza,inj,Quad PF,36+ Mos 01/03/2014  . Influenza-Unspecified 09/24/2014, 02/01/2016  . PPD Test 07/28/2014  . Pneumococcal Polysaccharide-23 01/03/2014    Social History  Substance Use Topics  . Smoking status: Never Smoker  . Smokeless tobacco: Not on file     Comment: Prior dip  . Alcohol use No    Review of   UTO 2/2 dementia and psychosis; nursinfg without concerns    Vitals:   07/20/16 1132  BP: 119/62  Pulse: 80  Resp: 14  Temp: 98 F (36.7 C)    Physical Exam  GENERAL APPEARANCE: Alert, conversant, No acute distress  SKIN: No diaphoresis rash HEENT: Unremarkable RESPIRATORY: Breathing is even, unlabored. Lung sounds are clear   CARDIOVASCULAR: Heart RRR no murmurs, rubs or gallops. No peripheral edema ; LUE shunt with thrill GASTROINTESTINAL: Abdomen is soft, non-tender, not distended w/ normal bowel sounds.  GENITOURINARY: Bladder non  tender, not distended  MUSCULOSKELETAL: b trams metatarsal amputations NEUROLOGIC: Cranial nerves 2-12 grossly intact. Moves all extremities PSYCHIATRIC: Mood and affect appropriate to situation with dementia, no behavioral issues  Patient Active Problem List   Diagnosis Date Noted  . Chronic combined systolic and diastolic congestive heart failure ([facility]) 12/15/2015  . Dyslipidemia associated with type 2 diabetes mellitus ([place]) 12/15/2015  . Ventricular tachycardia, sustained ([facility]) 12/15/2015  . Psychosis, paranoid ([place]) 08/20/2015  . Non compliance w medication regimen 08/20/2015  . ESRD (end stage renal disease) ([facility]) 04/19/2015  . PAD (peripheral artery disease) ([place] [facility]) 03/23/2015  . FTT (failure to thrive) in adult 01/14/2015  . Blindness with deafness 12/16/2014  . Anemia  in CKD (chronic kidney disease) 12/14/2014  . Cardiomyopathy, ischemic-EF 30-35% 11/21/2014  . Constipation 12/24/2013  . Diabetes mellitus with ESRD (end-stage renal disease) ([place]) 2020-04-813  . Hypertensive heart disease with CHF (congestive heart failure) ([place]) 08/09/2009    CBC    Component Value Date/Time   WBC 6.3 05/18/2016   WBC 6.5 03/27/2016 0533   RBC 3.65 (L) 03/27/2016 0533   HGB 11.3 (A) 05/18/2016   HCT 34 (A) 05/18/2016   PLT 269 05/18/2016   MCV 92.9 03/27/2016 0533   LYMPHSABS 2.1 03/27/2016 0533   MONOABS 0.5 03/27/2016 0533   EOSABS 0.2 03/27/2016 0533   BASOSABS 0.1 03/27/2016 0533    CMP     Component Value Date/Time   NA 137 05/18/2016   K 4.3 05/18/2016   CL 99 (L) 03/27/2016 0533   CO2 21 (L) 03/27/2016 0533   GLUCOSE 89 03/27/2016 0533   BUN 34 (A) 05/18/2016   CREATININE 4.4 (A) 05/18/2016   CREATININE 7.51 (H) 03/27/2016 0533   CREATININE 0.76 05/23/2011 0956   CALCIUM 9.0 03/27/2016 0533   PROT 7.7 08/19/2015 1838   ALBUMIN 2.9 (L) 08/23/2015 0946   AST 12 (A) 05/18/2016   ALT 19 05/18/2016   ALKPHOS 65 05/18/2016   BILITOT 0.8 08/19/2015 1838   GFRNONAA 5 (L)  03/27/2016 0533   GFRAA 6 (L) 03/27/2016 0533    Assessment and Plan  PAD: is without change is status post B transmetatarsal amputation: will continue plavix 75 mg daily   CHRONIC SYSTOLIC AND DIASTOLIC CHF: EF is 99991111; is presently stable will continue toprol xl 50 mg daily   SUSTAINED V TACH: not a candidate for ICD. Is on amiodarone 200 mg daily for heart rate control will monitor     Dekota Shenk D. Sheppard Coil, MD

## 2016-07-21 DIAGNOSIS — D631 Anemia in chronic kidney disease: Secondary | ICD-10-CM | POA: Diagnosis not present

## 2016-07-21 DIAGNOSIS — E1129 Type 2 diabetes mellitus with other diabetic kidney complication: Secondary | ICD-10-CM | POA: Diagnosis not present

## 2016-07-21 DIAGNOSIS — N186 End stage renal disease: Secondary | ICD-10-CM | POA: Diagnosis not present

## 2016-07-21 DIAGNOSIS — N2581 Secondary hyperparathyroidism of renal origin: Secondary | ICD-10-CM | POA: Diagnosis not present

## 2016-07-24 DIAGNOSIS — Z992 Dependence on renal dialysis: Secondary | ICD-10-CM | POA: Diagnosis not present

## 2016-07-24 DIAGNOSIS — D631 Anemia in chronic kidney disease: Secondary | ICD-10-CM | POA: Diagnosis not present

## 2016-07-24 DIAGNOSIS — N186 End stage renal disease: Secondary | ICD-10-CM | POA: Diagnosis not present

## 2016-07-24 DIAGNOSIS — N2581 Secondary hyperparathyroidism of renal origin: Secondary | ICD-10-CM | POA: Diagnosis not present

## 2016-07-24 DIAGNOSIS — E1129 Type 2 diabetes mellitus with other diabetic kidney complication: Secondary | ICD-10-CM | POA: Diagnosis not present

## 2016-07-26 DIAGNOSIS — N2581 Secondary hyperparathyroidism of renal origin: Secondary | ICD-10-CM | POA: Diagnosis not present

## 2016-07-26 DIAGNOSIS — E1129 Type 2 diabetes mellitus with other diabetic kidney complication: Secondary | ICD-10-CM | POA: Diagnosis not present

## 2016-07-26 DIAGNOSIS — N186 End stage renal disease: Secondary | ICD-10-CM | POA: Diagnosis not present

## 2016-07-28 DIAGNOSIS — N2581 Secondary hyperparathyroidism of renal origin: Secondary | ICD-10-CM | POA: Diagnosis not present

## 2016-07-28 DIAGNOSIS — N186 End stage renal disease: Secondary | ICD-10-CM | POA: Diagnosis not present

## 2016-07-28 DIAGNOSIS — E1129 Type 2 diabetes mellitus with other diabetic kidney complication: Secondary | ICD-10-CM | POA: Diagnosis not present

## 2016-07-31 DIAGNOSIS — N2581 Secondary hyperparathyroidism of renal origin: Secondary | ICD-10-CM | POA: Diagnosis not present

## 2016-07-31 DIAGNOSIS — N186 End stage renal disease: Secondary | ICD-10-CM | POA: Diagnosis not present

## 2016-07-31 DIAGNOSIS — E1129 Type 2 diabetes mellitus with other diabetic kidney complication: Secondary | ICD-10-CM | POA: Diagnosis not present

## 2016-08-02 DIAGNOSIS — N2581 Secondary hyperparathyroidism of renal origin: Secondary | ICD-10-CM | POA: Diagnosis not present

## 2016-08-02 DIAGNOSIS — N189 Chronic kidney disease, unspecified: Secondary | ICD-10-CM | POA: Diagnosis not present

## 2016-08-02 DIAGNOSIS — I10 Essential (primary) hypertension: Secondary | ICD-10-CM | POA: Diagnosis not present

## 2016-08-02 DIAGNOSIS — N186 End stage renal disease: Secondary | ICD-10-CM | POA: Diagnosis not present

## 2016-08-02 DIAGNOSIS — H540 Blindness, both eyes: Secondary | ICD-10-CM | POA: Diagnosis not present

## 2016-08-02 DIAGNOSIS — E119 Type 2 diabetes mellitus without complications: Secondary | ICD-10-CM | POA: Diagnosis not present

## 2016-08-02 DIAGNOSIS — E1129 Type 2 diabetes mellitus with other diabetic kidney complication: Secondary | ICD-10-CM | POA: Diagnosis not present

## 2016-08-04 DIAGNOSIS — N2581 Secondary hyperparathyroidism of renal origin: Secondary | ICD-10-CM | POA: Diagnosis not present

## 2016-08-04 DIAGNOSIS — E1129 Type 2 diabetes mellitus with other diabetic kidney complication: Secondary | ICD-10-CM | POA: Diagnosis not present

## 2016-08-04 DIAGNOSIS — N186 End stage renal disease: Secondary | ICD-10-CM | POA: Diagnosis not present

## 2016-08-07 DIAGNOSIS — E1129 Type 2 diabetes mellitus with other diabetic kidney complication: Secondary | ICD-10-CM | POA: Diagnosis not present

## 2016-08-07 DIAGNOSIS — N2581 Secondary hyperparathyroidism of renal origin: Secondary | ICD-10-CM | POA: Diagnosis not present

## 2016-08-07 DIAGNOSIS — N186 End stage renal disease: Secondary | ICD-10-CM | POA: Diagnosis not present

## 2016-08-09 DIAGNOSIS — E1129 Type 2 diabetes mellitus with other diabetic kidney complication: Secondary | ICD-10-CM | POA: Diagnosis not present

## 2016-08-09 DIAGNOSIS — N2581 Secondary hyperparathyroidism of renal origin: Secondary | ICD-10-CM | POA: Diagnosis not present

## 2016-08-09 DIAGNOSIS — N186 End stage renal disease: Secondary | ICD-10-CM | POA: Diagnosis not present

## 2016-08-11 DIAGNOSIS — E1129 Type 2 diabetes mellitus with other diabetic kidney complication: Secondary | ICD-10-CM | POA: Diagnosis not present

## 2016-08-11 DIAGNOSIS — N2581 Secondary hyperparathyroidism of renal origin: Secondary | ICD-10-CM | POA: Diagnosis not present

## 2016-08-11 DIAGNOSIS — N186 End stage renal disease: Secondary | ICD-10-CM | POA: Diagnosis not present

## 2016-08-14 DIAGNOSIS — N186 End stage renal disease: Secondary | ICD-10-CM | POA: Diagnosis not present

## 2016-08-14 DIAGNOSIS — N2581 Secondary hyperparathyroidism of renal origin: Secondary | ICD-10-CM | POA: Diagnosis not present

## 2016-08-14 DIAGNOSIS — E1129 Type 2 diabetes mellitus with other diabetic kidney complication: Secondary | ICD-10-CM | POA: Diagnosis not present

## 2016-08-16 DIAGNOSIS — N2581 Secondary hyperparathyroidism of renal origin: Secondary | ICD-10-CM | POA: Diagnosis not present

## 2016-08-16 DIAGNOSIS — E1129 Type 2 diabetes mellitus with other diabetic kidney complication: Secondary | ICD-10-CM | POA: Diagnosis not present

## 2016-08-16 DIAGNOSIS — N186 End stage renal disease: Secondary | ICD-10-CM | POA: Diagnosis not present

## 2016-08-18 DIAGNOSIS — N186 End stage renal disease: Secondary | ICD-10-CM | POA: Diagnosis not present

## 2016-08-18 DIAGNOSIS — E1129 Type 2 diabetes mellitus with other diabetic kidney complication: Secondary | ICD-10-CM | POA: Diagnosis not present

## 2016-08-18 DIAGNOSIS — N2581 Secondary hyperparathyroidism of renal origin: Secondary | ICD-10-CM | POA: Diagnosis not present

## 2016-08-21 DIAGNOSIS — N186 End stage renal disease: Secondary | ICD-10-CM | POA: Diagnosis not present

## 2016-08-21 DIAGNOSIS — N2581 Secondary hyperparathyroidism of renal origin: Secondary | ICD-10-CM | POA: Diagnosis not present

## 2016-08-21 DIAGNOSIS — E1129 Type 2 diabetes mellitus with other diabetic kidney complication: Secondary | ICD-10-CM | POA: Diagnosis not present

## 2016-08-23 DIAGNOSIS — E1129 Type 2 diabetes mellitus with other diabetic kidney complication: Secondary | ICD-10-CM | POA: Diagnosis not present

## 2016-08-23 DIAGNOSIS — N186 End stage renal disease: Secondary | ICD-10-CM | POA: Diagnosis not present

## 2016-08-23 DIAGNOSIS — N2581 Secondary hyperparathyroidism of renal origin: Secondary | ICD-10-CM | POA: Diagnosis not present

## 2016-08-24 DIAGNOSIS — N186 End stage renal disease: Secondary | ICD-10-CM | POA: Diagnosis not present

## 2016-08-24 DIAGNOSIS — E1129 Type 2 diabetes mellitus with other diabetic kidney complication: Secondary | ICD-10-CM | POA: Diagnosis not present

## 2016-08-24 DIAGNOSIS — Z992 Dependence on renal dialysis: Secondary | ICD-10-CM | POA: Diagnosis not present

## 2016-08-25 ENCOUNTER — Encounter: Payer: Self-pay | Admitting: Internal Medicine

## 2016-08-25 ENCOUNTER — Non-Acute Institutional Stay (SKILLED_NURSING_FACILITY): Payer: Medicare Other | Admitting: Internal Medicine

## 2016-08-25 DIAGNOSIS — E1129 Type 2 diabetes mellitus with other diabetic kidney complication: Secondary | ICD-10-CM | POA: Diagnosis not present

## 2016-08-25 DIAGNOSIS — N186 End stage renal disease: Secondary | ICD-10-CM | POA: Diagnosis not present

## 2016-08-25 DIAGNOSIS — I739 Peripheral vascular disease, unspecified: Secondary | ICD-10-CM

## 2016-08-25 DIAGNOSIS — N2581 Secondary hyperparathyroidism of renal origin: Secondary | ICD-10-CM | POA: Diagnosis not present

## 2016-08-25 DIAGNOSIS — F22 Delusional disorders: Secondary | ICD-10-CM | POA: Diagnosis not present

## 2016-08-25 DIAGNOSIS — I472 Ventricular tachycardia, unspecified: Secondary | ICD-10-CM

## 2016-08-25 DIAGNOSIS — D631 Anemia in chronic kidney disease: Secondary | ICD-10-CM | POA: Diagnosis not present

## 2016-08-25 DIAGNOSIS — R627 Adult failure to thrive: Secondary | ICD-10-CM | POA: Diagnosis not present

## 2016-08-25 NOTE — Progress Notes (Signed)
Location:   [facility] Room Number: [id number] Place of Service:  SNF (31) Provider:  Henreitta Leber, MD  Patient Care Team: Hennie Duos, MD as PCP - General (Internal Medicine) Gerlene Fee, NP as Nurse Practitioner ([place]) [facility] ([place])  Extended Emergency Contact Information Primary Emergency Contact: Register,[name] Address: [place], [place] [address] [place] Phone: [phone] Relation: Spouse Secondary Emergency Contact: [name] of [place] Phone: [phone] Mobile Phone: [phone] Relation: Sister  Code Status:  Most Goals of care: Advanced Directive information Advanced Directives 08/25/2016  Does patient have an advance directive? Yes  Type of Advance Directive Out of facility DNR (pink MOST or yellow form)  Does patient want to make changes to advanced directive? No - Patient declined  Copy of advanced directive(s) in chart? Yes  Would patient like information on creating an advanced directive? -  Pre-existing out of facility DNR order (yellow form or pink MOST form) -     Chief Complaint  Patient presents with  . Medical Management of Chronic Issues    Routine Visit   Medical management of chronic medical issues including end-stage renal disease on dialysis-hypertension-peripheral vascular disease-CHF-psychosis.   HPI:  Pt is a 60 y.o. female seen today for medical management of chronic diseases.  As noted above.  Per nursing staff she continues to be at baseline and quite stable.  She does have a history of deafness as well as blindness but appears to do well with supportive care.  She continues with dialysis 3 times a week with a history of end-stage renal disease.  Hypertension appears stable on Toprol recent blood pressures 134/75-130/60.  She does have a history combined CHF this  appears to be stable.  Regards to a history of V. tach she continues on amiodarone she is not a candidate for an ICD.  Currently she has no complaints she did have dialysis earlier today but appears to have tolerated this well.  She is currently sitting in her wheelchair comfortably in vital signs have been stable.     Past Medical History:  Diagnosis Date  . Anemia    Due to fibroids.  . Bilateral leg edema    a. Chronic.  Marland Kitchen Blind   . Cardiac arrest ([place]) 12/12/2014  . CHF (congestive heart failure) ([place])   . Coronary artery disease   . Deaf    USE SIGN INTERPRETER.  Divorced from husband but lives with him. Has daughter but she does not care for her.  . Diabetes mellitus    a. Per PCP note 2012 (A1C 9.2) - pt unwilling to take meds and was educated on risk of uncontrolled DM.  b. A1C 5.9 in 11/2013.  Marland Kitchen ESRD on hemodialysis ([facility] [place])    Mon, Wed, Fri  . Essential hypertension 08/09/2009   Qualifier: Diagnosis of  By: Sarita Haver  MD, Coralyn Mark    . Fibroid uterus   . Heart murmur   . Hyperlipidemia   . Hypertension    a. Has previously refused blood pressure meds.   . Leiomyoma of uterus 08/09/2009   Qualifier: Diagnosis of  By: Sarita Haver  MD, Coralyn Mark    . MI (myocardial infarction) ([facility]) 11/2014  . Moderate tricuspid regurgitation   . Osteomyelitis ([facility])   . Psychiatric disorder    She frequently exhibits paranoia and has been diagnosed with  psychotic d/o NOS during hospital stay in the past. She is tangetial and perseverative during her visits. She apparently has had a bad experience with mental health in [place] in the past and refuses to discuss mental issues for fear that she will be sent back there. Her paranoia, communication issues, financial woes and lack of fam  . Pulmonary hypertension ([facility])    Past Surgical History:  Procedure Laterality Date  . AMPUTATION Bilateral 04/02/2015   Procedure: Amputation Bilateral Great Toes MTP Joint;  Surgeon: Newt Minion, MD;  Location: [facility];   Service: Orthopedics;  Laterality: Bilateral;  . AMPUTATION Left 05/14/2015   Procedure: Left Transmetatarsal Amputation;  Surgeon: Newt Minion, MD;  Location: [facility];  Service: Orthopedics;  Laterality: Left;  . AMPUTATION Right 08/20/2015   Procedure: Transmetatarsal Amputation Right Foot;  Surgeon: Newt Minion, MD;  Location: [facility];  Service: Orthopedics;  Laterality: Right;  . AV FISTULA PLACEMENT Left 01/02/2014   Procedure: ARTERIOVENOUS (AV) FISTULA CREATION;  Surgeon: Rosetta Posner, MD;  Location: [facility];  Service: Vascular;  Laterality: Left;  . INSERTION OF DIALYSIS CATHETER Right 01/02/2014   Procedure: INSERTION OF DIALYSIS CATHETER;  Surgeon: Rosetta Posner, MD;  Location: [facility];  Service: Vascular;  Laterality: Right;  . NO PAST SURGERIES    . ORIF PATELLA Left 05/17/2015   Procedure: OPEN REDUCTION INTERNAL (ORIF) FIXATION PATELLA;  Surgeon: Newt Minion, MD;  Location: [facility];  Service: Orthopedics;  Laterality: Left;    No Known Allergies    Medication List       Accurate as of 08/25/16  1:30 PM. Always use your most recent med list.          amiodarone 200 MG tablet Commonly known as:  PACERONE Take 200 mg by mouth daily.   ARIPiprazole 10 MG tablet Commonly known as:  ABILIFY Take 10 mg by mouth at bedtime.   calcium acetate 667 MG tablet Commonly known as:  PHOSLO Take 3 tablets by mouth before meals for dialysis   clopidogrel 75 MG tablet Commonly known as:  PLAVIX Take 75 mg by mouth every evening.   metoprolol succinate 50 MG 24 hr tablet Commonly known as:  TOPROL-XL Take 50 mg by mouth every evening. Take with or immediately following a meal.   nitroGLYCERIN 0.4 MG SL tablet Commonly known as:  NITROSTAT Place 0.4 mg under the tongue every 5 (five) minutes as needed for chest pain.   polyethylene glycol packet Commonly known as:  MIRALAX / GLYCOLAX Take 17 g by mouth daily.   PROSTAT PO Take 60 g by mouth 2 (two) times daily.       Review of  Systems   Essentially unable to perform as noted above per nursing staff patient remains at baseline and doing well with supportive care  Immunization History  Administered Date(s) Administered  . Influenza,inj,Quad PF,36+ Mos 01/03/2014  . Influenza-Unspecified 09/24/2014, 02/01/2016  . PPD Test 07/28/2014  . Pneumococcal Polysaccharide-23 01/03/2014   Pertinent  Health Maintenance Due  Topic Date Due  . HEMOGLOBIN A1C  11/18/2016   No flowsheet data found. Functional Status Survey:    Vitals:   08/25/16 1308  BP: 134/75  Pulse: 64  Resp: 20  Temp: 97.4 F (36.3 C)  TempSrc: Oral  SpO2: 99%  Weight: 155 lb 3.2 oz (70.4 kg)  Height: 5\' 6"  (1.676 m)   Body mass index is 25.05 kg/m. Physical Exam   Constitutional: She appears well-developed  and well-nourished. No distress. Comfortably in her wheelchair  Eyes: Conjunctivae are normal. She is blind Neck: Neck supple. No JVD present. No thyromegaly present.  Cardiovascular: Normal rate, regular rhythm and intact distal pulses.   Respiratory: Effort normal and breath sounds normal. No respiratory distress. She has no wheezes.  GI: Soft. Bowel sounds are normal. She exhibits no distension. There is no tenderness.  Musculoskeletal: She exhibits no edema.  Able to move all extremities  Has bilateral  transmetatarsal amputation;  .  Neurological: She is alert  cooperative with exam is able to follow verbal commands with nursing support.  Skin: Skin is warm and dry. She is not diaphoretic.  Left upper a/v fistula: +thril +bruit  Labs reviewed:  Recent Labs  03/27/16 0533 05/18/16  NA 136 137  K 4.0 4.3  CL 99*  --   CO2 21*  --   GLUCOSE 89  --   BUN 71* 34*  CREATININE 7.51* 4.4*  CALCIUM 9.0  --     Recent Labs  05/18/16  AST 12*  ALT 19  ALKPHOS 65    Recent Labs  03/27/16 0533 05/18/16  WBC 6.5 6.3  NEUTROABS 3.6  --   HGB 10.8* 11.3*  HCT 33.9* 34*  MCV 92.9  --   PLT 204 269   Lab Results    Component Value Date   TSH 3.17 05/18/2016   Lab Results  Component Value Date   HGBA1C 5.4 05/18/2016   Lab Results  Component Value Date   CHOL 232 (A) 05/18/2016   HDL 80 (A) 05/18/2016   LDLCALC 134 05/18/2016   LDLDIRECT 170 (H) 08/09/2009   TRIG 90 05/18/2016   CHOLHDL 4.6 Ratio 02/09/2010    Significant Diagnostic Results in last 30 days:  No results found.  Assessment/Plan  :    1. ESRD on hemodialysis: is followed by nephrology. Will continue dialysis three times weekly. Will continue phoslo 667 mg three times daily will monitor   2. Hypertension: is presently stable will continue toprol xl 50 mg daily some blood pressures 130/66-134/75   3. PAD: is without change is status post left and right transmetatarsal amputation: will continue plavix 75 mg daily   4. Combine systolic and diastolic heart failure: EF is 30-35%; is presently stable will continue toprol xl 50 mg daily Weights  appear to be stable  5. Sustained V tach: not a candidate for ICD. Is on amiodarone 200 mg daily for heart rate control will monitor   6. FTT: her current weight is 155 pounds; her albumin is 3.9. Will continue supplements per facility protocol.   7. Psychosis; paranoid : will continue abilify 10 mg daily . Will monitor   8. Constipation: will continue miralax daily     [phone]

## 2016-08-27 ENCOUNTER — Encounter: Payer: Self-pay | Admitting: Internal Medicine

## 2016-08-28 DIAGNOSIS — N2581 Secondary hyperparathyroidism of renal origin: Secondary | ICD-10-CM | POA: Diagnosis not present

## 2016-08-28 DIAGNOSIS — D631 Anemia in chronic kidney disease: Secondary | ICD-10-CM | POA: Diagnosis not present

## 2016-08-28 DIAGNOSIS — N186 End stage renal disease: Secondary | ICD-10-CM | POA: Diagnosis not present

## 2016-08-28 DIAGNOSIS — E1129 Type 2 diabetes mellitus with other diabetic kidney complication: Secondary | ICD-10-CM | POA: Diagnosis not present

## 2016-08-30 DIAGNOSIS — E1129 Type 2 diabetes mellitus with other diabetic kidney complication: Secondary | ICD-10-CM | POA: Diagnosis not present

## 2016-08-30 DIAGNOSIS — N186 End stage renal disease: Secondary | ICD-10-CM | POA: Diagnosis not present

## 2016-08-30 DIAGNOSIS — D631 Anemia in chronic kidney disease: Secondary | ICD-10-CM | POA: Diagnosis not present

## 2016-08-30 DIAGNOSIS — N2581 Secondary hyperparathyroidism of renal origin: Secondary | ICD-10-CM | POA: Diagnosis not present

## 2016-08-31 DIAGNOSIS — Z961 Presence of intraocular lens: Secondary | ICD-10-CM | POA: Diagnosis not present

## 2016-08-31 DIAGNOSIS — Z79899 Other long term (current) drug therapy: Secondary | ICD-10-CM | POA: Diagnosis not present

## 2016-08-31 DIAGNOSIS — E113523 Type 2 diabetes mellitus with proliferative diabetic retinopathy with traction retinal detachment involving the macula, bilateral: Secondary | ICD-10-CM | POA: Diagnosis not present

## 2016-08-31 LAB — HM DIABETES EYE EXAM

## 2016-09-01 DIAGNOSIS — D631 Anemia in chronic kidney disease: Secondary | ICD-10-CM | POA: Diagnosis not present

## 2016-09-01 DIAGNOSIS — E1129 Type 2 diabetes mellitus with other diabetic kidney complication: Secondary | ICD-10-CM | POA: Diagnosis not present

## 2016-09-01 DIAGNOSIS — N186 End stage renal disease: Secondary | ICD-10-CM | POA: Diagnosis not present

## 2016-09-01 DIAGNOSIS — N2581 Secondary hyperparathyroidism of renal origin: Secondary | ICD-10-CM | POA: Diagnosis not present

## 2016-09-04 DIAGNOSIS — D631 Anemia in chronic kidney disease: Secondary | ICD-10-CM | POA: Diagnosis not present

## 2016-09-04 DIAGNOSIS — N2581 Secondary hyperparathyroidism of renal origin: Secondary | ICD-10-CM | POA: Diagnosis not present

## 2016-09-04 DIAGNOSIS — N186 End stage renal disease: Secondary | ICD-10-CM | POA: Diagnosis not present

## 2016-09-04 DIAGNOSIS — E1129 Type 2 diabetes mellitus with other diabetic kidney complication: Secondary | ICD-10-CM | POA: Diagnosis not present

## 2016-09-06 DIAGNOSIS — E1129 Type 2 diabetes mellitus with other diabetic kidney complication: Secondary | ICD-10-CM | POA: Diagnosis not present

## 2016-09-06 DIAGNOSIS — D631 Anemia in chronic kidney disease: Secondary | ICD-10-CM | POA: Diagnosis not present

## 2016-09-06 DIAGNOSIS — N2581 Secondary hyperparathyroidism of renal origin: Secondary | ICD-10-CM | POA: Diagnosis not present

## 2016-09-06 DIAGNOSIS — N186 End stage renal disease: Secondary | ICD-10-CM | POA: Diagnosis not present

## 2016-09-08 DIAGNOSIS — D631 Anemia in chronic kidney disease: Secondary | ICD-10-CM | POA: Diagnosis not present

## 2016-09-08 DIAGNOSIS — N2581 Secondary hyperparathyroidism of renal origin: Secondary | ICD-10-CM | POA: Diagnosis not present

## 2016-09-08 DIAGNOSIS — N186 End stage renal disease: Secondary | ICD-10-CM | POA: Diagnosis not present

## 2016-09-08 DIAGNOSIS — E1129 Type 2 diabetes mellitus with other diabetic kidney complication: Secondary | ICD-10-CM | POA: Diagnosis not present

## 2016-09-11 DIAGNOSIS — N186 End stage renal disease: Secondary | ICD-10-CM | POA: Diagnosis not present

## 2016-09-11 DIAGNOSIS — E1129 Type 2 diabetes mellitus with other diabetic kidney complication: Secondary | ICD-10-CM | POA: Diagnosis not present

## 2016-09-11 DIAGNOSIS — N2581 Secondary hyperparathyroidism of renal origin: Secondary | ICD-10-CM | POA: Diagnosis not present

## 2016-09-11 DIAGNOSIS — D631 Anemia in chronic kidney disease: Secondary | ICD-10-CM | POA: Diagnosis not present

## 2016-09-13 DIAGNOSIS — N186 End stage renal disease: Secondary | ICD-10-CM | POA: Diagnosis not present

## 2016-09-13 DIAGNOSIS — E1129 Type 2 diabetes mellitus with other diabetic kidney complication: Secondary | ICD-10-CM | POA: Diagnosis not present

## 2016-09-13 DIAGNOSIS — D631 Anemia in chronic kidney disease: Secondary | ICD-10-CM | POA: Diagnosis not present

## 2016-09-13 DIAGNOSIS — N2581 Secondary hyperparathyroidism of renal origin: Secondary | ICD-10-CM | POA: Diagnosis not present

## 2016-09-15 DIAGNOSIS — E1129 Type 2 diabetes mellitus with other diabetic kidney complication: Secondary | ICD-10-CM | POA: Diagnosis not present

## 2016-09-15 DIAGNOSIS — N2581 Secondary hyperparathyroidism of renal origin: Secondary | ICD-10-CM | POA: Diagnosis not present

## 2016-09-15 DIAGNOSIS — N186 End stage renal disease: Secondary | ICD-10-CM | POA: Diagnosis not present

## 2016-09-15 DIAGNOSIS — D631 Anemia in chronic kidney disease: Secondary | ICD-10-CM | POA: Diagnosis not present

## 2016-09-18 DIAGNOSIS — N186 End stage renal disease: Secondary | ICD-10-CM | POA: Diagnosis not present

## 2016-09-18 DIAGNOSIS — F209 Schizophrenia, unspecified: Secondary | ICD-10-CM | POA: Diagnosis not present

## 2016-09-18 DIAGNOSIS — F329 Major depressive disorder, single episode, unspecified: Secondary | ICD-10-CM | POA: Diagnosis not present

## 2016-09-18 DIAGNOSIS — D631 Anemia in chronic kidney disease: Secondary | ICD-10-CM | POA: Diagnosis not present

## 2016-09-18 DIAGNOSIS — E1129 Type 2 diabetes mellitus with other diabetic kidney complication: Secondary | ICD-10-CM | POA: Diagnosis not present

## 2016-09-18 DIAGNOSIS — N2581 Secondary hyperparathyroidism of renal origin: Secondary | ICD-10-CM | POA: Diagnosis not present

## 2016-09-20 DIAGNOSIS — D631 Anemia in chronic kidney disease: Secondary | ICD-10-CM | POA: Diagnosis not present

## 2016-09-20 DIAGNOSIS — N186 End stage renal disease: Secondary | ICD-10-CM | POA: Diagnosis not present

## 2016-09-20 DIAGNOSIS — N2581 Secondary hyperparathyroidism of renal origin: Secondary | ICD-10-CM | POA: Diagnosis not present

## 2016-09-20 DIAGNOSIS — E1129 Type 2 diabetes mellitus with other diabetic kidney complication: Secondary | ICD-10-CM | POA: Diagnosis not present

## 2016-09-22 DIAGNOSIS — E1129 Type 2 diabetes mellitus with other diabetic kidney complication: Secondary | ICD-10-CM | POA: Diagnosis not present

## 2016-09-22 DIAGNOSIS — N2581 Secondary hyperparathyroidism of renal origin: Secondary | ICD-10-CM | POA: Diagnosis not present

## 2016-09-22 DIAGNOSIS — D631 Anemia in chronic kidney disease: Secondary | ICD-10-CM | POA: Diagnosis not present

## 2016-09-22 DIAGNOSIS — N186 End stage renal disease: Secondary | ICD-10-CM | POA: Diagnosis not present

## 2016-09-23 DIAGNOSIS — E1129 Type 2 diabetes mellitus with other diabetic kidney complication: Secondary | ICD-10-CM | POA: Diagnosis not present

## 2016-09-23 DIAGNOSIS — N186 End stage renal disease: Secondary | ICD-10-CM | POA: Diagnosis not present

## 2016-09-23 DIAGNOSIS — Z992 Dependence on renal dialysis: Secondary | ICD-10-CM | POA: Diagnosis not present

## 2016-09-25 DIAGNOSIS — N186 End stage renal disease: Secondary | ICD-10-CM | POA: Diagnosis not present

## 2016-09-25 DIAGNOSIS — N2581 Secondary hyperparathyroidism of renal origin: Secondary | ICD-10-CM | POA: Diagnosis not present

## 2016-09-25 DIAGNOSIS — D631 Anemia in chronic kidney disease: Secondary | ICD-10-CM | POA: Diagnosis not present

## 2016-09-27 DIAGNOSIS — N186 End stage renal disease: Secondary | ICD-10-CM | POA: Diagnosis not present

## 2016-09-27 DIAGNOSIS — N2581 Secondary hyperparathyroidism of renal origin: Secondary | ICD-10-CM | POA: Diagnosis not present

## 2016-09-27 DIAGNOSIS — D631 Anemia in chronic kidney disease: Secondary | ICD-10-CM | POA: Diagnosis not present

## 2016-09-29 DIAGNOSIS — N2581 Secondary hyperparathyroidism of renal origin: Secondary | ICD-10-CM | POA: Diagnosis not present

## 2016-09-29 DIAGNOSIS — D631 Anemia in chronic kidney disease: Secondary | ICD-10-CM | POA: Diagnosis not present

## 2016-09-29 DIAGNOSIS — N186 End stage renal disease: Secondary | ICD-10-CM | POA: Diagnosis not present

## 2016-10-02 DIAGNOSIS — N186 End stage renal disease: Secondary | ICD-10-CM | POA: Diagnosis not present

## 2016-10-02 DIAGNOSIS — D631 Anemia in chronic kidney disease: Secondary | ICD-10-CM | POA: Diagnosis not present

## 2016-10-02 DIAGNOSIS — N2581 Secondary hyperparathyroidism of renal origin: Secondary | ICD-10-CM | POA: Diagnosis not present

## 2016-10-03 ENCOUNTER — Non-Acute Institutional Stay (SKILLED_NURSING_FACILITY): Payer: Medicare Other | Admitting: Internal Medicine

## 2016-10-03 DIAGNOSIS — I5042 Chronic combined systolic (congestive) and diastolic (congestive) heart failure: Secondary | ICD-10-CM

## 2016-10-03 DIAGNOSIS — N186 End stage renal disease: Secondary | ICD-10-CM | POA: Diagnosis not present

## 2016-10-03 DIAGNOSIS — F22 Delusional disorders: Secondary | ICD-10-CM | POA: Diagnosis not present

## 2016-10-03 DIAGNOSIS — I739 Peripheral vascular disease, unspecified: Secondary | ICD-10-CM

## 2016-10-03 NOTE — Progress Notes (Signed)
This is a routine visit.  Level care skilled.  [place]    Chief complaint-medical management of chronic medical issues including hypertension-peripheral vascular disease-psychosis-CHF-end-stage renal disease on dialysis.  History of present illness.  Patient is a 60 year old female seen today for medical management of above-stated issues.  Nursing staff has not reported any recent issues.  She does have a history of end-stage renal disease and is on dialysis 3 times a week.  She is also got a history of deafness and blindness but is doing well at this time with supportive care.  For hypertension she is on Toprol recent blood pressures appear to be stable most recently 126/87.  She also has a history of V. tach is on amiodarone not a candidate for an ICD. Rate appears to be controlled to see a listed rate of 90 it was 80 on exam today   Past Medical History:  Diagnosis Date  . Anemia    Due to fibroids.  . Bilateral leg edema    a. Chronic.  Marland Kitchen Blind   . Cardiac arrest ([place]) 12/12/2014  . CHF (congestive heart failure) ([place])   . Coronary artery disease   . Deaf    USE SIGN INTERPRETER.  Divorced from husband but lives with him. Has daughter but she does not care for her.  . Diabetes mellitus    a. Per PCP note 2012 (A1C 9.2) - pt unwilling to take meds and was educated on risk of uncontrolled DM.  b. A1C 5.9 in 11/2013.  Marland Kitchen ESRD on hemodialysis ([place])    Mon, Wed, Fri  . Essential hypertension 08/09/2009   Qualifier: Diagnosis of  By: Sarita Haver  MD, Coralyn Mark    . Fibroid uterus   . Heart murmur   . Hyperlipidemia   . Hypertension    a. Has previously refused blood pressure meds.   . Leiomyoma of uterus 08/09/2009   Qualifier: Diagnosis of  By: Sarita Haver  MD, Coralyn Mark    . MI (myocardial infarction) ([place]) 11/2014  . Moderate tricuspid regurgitation   . Osteomyelitis ([place])   . Psychiatric disorder    She frequently exhibits  paranoia and has been diagnosed with psychotic d/o NOS during hospital stay in the past. She is tangetial and perseverative during her visits. She apparently has had a bad experience with mental health in [place] in the past and refuses to discuss mental issues for fear that she will be sent back there. Her paranoia, communication issues, financial woes and lack of fam  . Pulmonary hypertension ([place])         Past Surgical History:  Procedure Laterality Date  . AMPUTATION Bilateral 04/02/2015   Procedure: Amputation Bilateral Great Toes MTP Joint;  Surgeon: Newt Minion, MD;  Location: [facility];  Service: Orthopedics;  Laterality: Bilateral;  . AMPUTATION Left 05/14/2015   Procedure: Left Transmetatarsal Amputation;  Surgeon: Newt Minion, MD;  Location: [place];  Service: Orthopedics;  Laterality: Left;  . AMPUTATION Right 08/20/2015   Procedure: Transmetatarsal Amputation Right Foot;  Surgeon: Newt Minion, MD;  Location: [facility];  Service: Orthopedics;  Laterality: Right;  . AV FISTULA PLACEMENT Left 01/02/2014   Procedure: ARTERIOVENOUS (AV) FISTULA CREATION;  Surgeon: Rosetta Posner, MD;  Location: [facility];  Service: Vascular;  Laterality: Left;  . INSERTION OF DIALYSIS CATHETER Right 01/02/2014   Procedure: INSERTION OF DIALYSIS CATHETER;  Surgeon: Rosetta Posner, MD;  Location: [place];  Service: Vascular;  Laterality: Right;  .  NO PAST SURGERIES    . ORIF PATELLA Left 05/17/2015   Procedure: OPEN REDUCTION INTERNAL (ORIF) FIXATION PATELLA;  Surgeon: Newt Minion, MD;  Location: [place] [facility];  Service: Orthopedics;  Laterality: Left;    No Known Allergies        Medication List              amiodarone 200 MG tablet Commonly known as:  PACERONE Take 200 mg by mouth daily.  ARIPiprazole 10 MG tablet Commonly known as:  ABILIFY Take 10 mg by mouth at bedtime.  calcium acetate 667 MG tablet Commonly known as:  PHOSLO Take 3 tablets by mouth before meals for dialysis  clopidogrel 75 MG  tablet Commonly known as:  PLAVIX Take 75 mg by mouth every evening.  metoprolol succinate 50 MG 24 hr tablet Commonly known as:  TOPROL-XL Take 50 mg by mouth every evening. Take with or immediately following a meal.  nitroGLYCERIN 0.4 MG SL tablet Commonly known as:  NITROSTAT Place 0.4 mg under the tongue every 5 (five) minutes as needed for chest pain.  polyethylene glycol packet Commonly known as:  MIRALAX / GLYCOLAX Take 17 g by mouth daily.  PROSTAT PO Take 60 g by mouth 2 (two) times daily.      Review of Systems   Essentially unable to perform as noted above per nursing staff patient remains at baseline and doing well with supportive care--nursing staff has not reported any recent issues she continues to be pleasant smiling cooperative      Immunization Histor  Administered Date(s) Administered  . Influenza,inj,Quad PF,36+ Mos 01/03/2014  . Influenza-Unspecified 09/24/2014, 02/01/2016  . PPD Test 07/28/2014  . Pneumococcal Polysaccharide-23 01/03/2014       Pertinent  Health Maintenance Due  Topic Date Due  . HEMOGLOBIN A1C  11/18/2016   No flowsheet data found. Functional Status Survey:    temperature 98.0 pulse 90 respiratins 20 blood pressure 126/87 O2 saturation is 95% on room air  Physical Exam   Constitutional: She appears well-developed and well-nourished. No distress. Comfortable  in her wheelchair  Eyes: Conjunctivae are normal. She is blind Neck: Neck supple. No JVD present. No thyromegaly present.  Oropharynx is clear mucous membranes moist Cardiovascular: Normal rate, regular rhythm and intact distal pulses.   Respiratory: Effort normal and breath sounds normal. No respiratory distress. She has no wheezes.  GI: Soft. Bowel sounds are normal. She exhibits no distension. There is no tenderness.  Musculoskeletal: She exhibits no edema.  Able to move all extremities  Has bilateral transmetatarsal amputation;  .  Neurological: She is  alert  cooperative with exam is able to follow verbal commands with nursing support.  Skin: Skin is warm and dry. She is not diaphoretic.  Left upper a/v fistula: +thril +bruit  Labs reviewed:  Recent Labs (within last 365 days)   Recent Labs  03/27/16 0533 05/18/16  NA 136 137  K 4.0 4.3  CL 99*  --   CO2 21*  --   GLUCOSE 89  --   BUN 71* 34*  CREATININE 7.51* 4.4*  CALCIUM 9.0  --       Recent Labs (within last 365 days)   Recent Labs  05/18/16  AST 12*  ALT 19  ALKPHOS 65      Recent Labs (within last 365 days)   Recent Labs  03/27/16 0533 05/18/16  WBC 6.5 6.3  NEUTROABS 3.6  --   HGB 10.8* 11.3*  HCT 33.9* 34*  MCV 92.9  --   PLT 204 269     Recent Labs       Lab Results  Component Value Date   TSH 3.17 05/18/2016     Recent Labs       Lab Results  Component Value Date   HGBA1C 5.4 05/18/2016     Recent Labs       Lab Results  Component Value Date   CHOL 232 (A) 05/18/2016   HDL 80 (A) 05/18/2016   LDLCALC 134 05/18/2016   LDLDIRECT 170 (H) 08/09/2009   TRIG 90 05/18/2016   CHOLHDL 4.6 Ratio 02/09/2010      Significant Diagnostic Results in last 30 days:  Imaging Results  No results found.    Assessment/Plan  :    1. ESRD on hemodialysis: is followed by nephrology. Will continue dialysis three times weekly. Will continue phoslo 667 mg three times daily will monitor   2. Hypertension: is presently stable will continue toprol xl 50 mg daily    3. PAD: is without change is status post left and right transmetatarsal amputation: will continue plavix 75 mg daily   4. Combine systolic and diastolic heart failure: EF is 30-35%; is presently stable will continue toprol xl 50 mg daily Weights  appear to be stable  5. Sustained V tach: not a candidate for ICD. Is on amiodarone 200 mg daily for heart rate control will monitor--rate appears to be controlled   6. FTT: her current weight is 154 pounds; her  albumin is 3.9. Will continue supplements per facility protocol.   7. Psychosis; paranoid : will continue abilify 10 mg daily . Will monitor -- appears to be quite stable was pleasant and cooperative with exam  8. Constipation: will continue miralax daily     [phone]

## 2016-10-04 DIAGNOSIS — N186 End stage renal disease: Secondary | ICD-10-CM | POA: Diagnosis not present

## 2016-10-04 DIAGNOSIS — D631 Anemia in chronic kidney disease: Secondary | ICD-10-CM | POA: Diagnosis not present

## 2016-10-04 DIAGNOSIS — N2581 Secondary hyperparathyroidism of renal origin: Secondary | ICD-10-CM | POA: Diagnosis not present

## 2016-10-06 DIAGNOSIS — N2581 Secondary hyperparathyroidism of renal origin: Secondary | ICD-10-CM | POA: Diagnosis not present

## 2016-10-06 DIAGNOSIS — D631 Anemia in chronic kidney disease: Secondary | ICD-10-CM | POA: Diagnosis not present

## 2016-10-06 DIAGNOSIS — N186 End stage renal disease: Secondary | ICD-10-CM | POA: Diagnosis not present

## 2016-10-09 DIAGNOSIS — N186 End stage renal disease: Secondary | ICD-10-CM | POA: Diagnosis not present

## 2016-10-09 DIAGNOSIS — N2581 Secondary hyperparathyroidism of renal origin: Secondary | ICD-10-CM | POA: Diagnosis not present

## 2016-10-09 DIAGNOSIS — D631 Anemia in chronic kidney disease: Secondary | ICD-10-CM | POA: Diagnosis not present

## 2016-10-11 DIAGNOSIS — N186 End stage renal disease: Secondary | ICD-10-CM | POA: Diagnosis not present

## 2016-10-11 DIAGNOSIS — N2581 Secondary hyperparathyroidism of renal origin: Secondary | ICD-10-CM | POA: Diagnosis not present

## 2016-10-11 DIAGNOSIS — D631 Anemia in chronic kidney disease: Secondary | ICD-10-CM | POA: Diagnosis not present

## 2016-10-13 DIAGNOSIS — N2581 Secondary hyperparathyroidism of renal origin: Secondary | ICD-10-CM | POA: Diagnosis not present

## 2016-10-13 DIAGNOSIS — N186 End stage renal disease: Secondary | ICD-10-CM | POA: Diagnosis not present

## 2016-10-13 DIAGNOSIS — D631 Anemia in chronic kidney disease: Secondary | ICD-10-CM | POA: Diagnosis not present

## 2016-10-16 DIAGNOSIS — N2581 Secondary hyperparathyroidism of renal origin: Secondary | ICD-10-CM | POA: Diagnosis not present

## 2016-10-16 DIAGNOSIS — D631 Anemia in chronic kidney disease: Secondary | ICD-10-CM | POA: Diagnosis not present

## 2016-10-16 DIAGNOSIS — N186 End stage renal disease: Secondary | ICD-10-CM | POA: Diagnosis not present

## 2016-10-18 DIAGNOSIS — D631 Anemia in chronic kidney disease: Secondary | ICD-10-CM | POA: Diagnosis not present

## 2016-10-18 DIAGNOSIS — E1129 Type 2 diabetes mellitus with other diabetic kidney complication: Secondary | ICD-10-CM | POA: Diagnosis not present

## 2016-10-18 DIAGNOSIS — N186 End stage renal disease: Secondary | ICD-10-CM | POA: Diagnosis not present

## 2016-10-18 DIAGNOSIS — N2581 Secondary hyperparathyroidism of renal origin: Secondary | ICD-10-CM | POA: Diagnosis not present

## 2016-10-20 DIAGNOSIS — N186 End stage renal disease: Secondary | ICD-10-CM | POA: Diagnosis not present

## 2016-10-20 DIAGNOSIS — N2581 Secondary hyperparathyroidism of renal origin: Secondary | ICD-10-CM | POA: Diagnosis not present

## 2016-10-20 DIAGNOSIS — D631 Anemia in chronic kidney disease: Secondary | ICD-10-CM | POA: Diagnosis not present

## 2016-10-23 DIAGNOSIS — N186 End stage renal disease: Secondary | ICD-10-CM | POA: Diagnosis not present

## 2016-10-23 DIAGNOSIS — N2581 Secondary hyperparathyroidism of renal origin: Secondary | ICD-10-CM | POA: Diagnosis not present

## 2016-10-23 DIAGNOSIS — D631 Anemia in chronic kidney disease: Secondary | ICD-10-CM | POA: Diagnosis not present

## 2016-10-24 DIAGNOSIS — Z992 Dependence on renal dialysis: Secondary | ICD-10-CM | POA: Diagnosis not present

## 2016-10-24 DIAGNOSIS — E1129 Type 2 diabetes mellitus with other diabetic kidney complication: Secondary | ICD-10-CM | POA: Diagnosis not present

## 2016-10-24 DIAGNOSIS — N186 End stage renal disease: Secondary | ICD-10-CM | POA: Diagnosis not present

## 2016-10-25 DIAGNOSIS — N2581 Secondary hyperparathyroidism of renal origin: Secondary | ICD-10-CM | POA: Diagnosis not present

## 2016-10-25 DIAGNOSIS — E1129 Type 2 diabetes mellitus with other diabetic kidney complication: Secondary | ICD-10-CM | POA: Diagnosis not present

## 2016-10-25 DIAGNOSIS — D631 Anemia in chronic kidney disease: Secondary | ICD-10-CM | POA: Diagnosis not present

## 2016-10-25 DIAGNOSIS — N186 End stage renal disease: Secondary | ICD-10-CM | POA: Diagnosis not present

## 2016-10-27 DIAGNOSIS — N2581 Secondary hyperparathyroidism of renal origin: Secondary | ICD-10-CM | POA: Diagnosis not present

## 2016-10-27 DIAGNOSIS — D631 Anemia in chronic kidney disease: Secondary | ICD-10-CM | POA: Diagnosis not present

## 2016-10-27 DIAGNOSIS — E1129 Type 2 diabetes mellitus with other diabetic kidney complication: Secondary | ICD-10-CM | POA: Diagnosis not present

## 2016-10-27 DIAGNOSIS — N186 End stage renal disease: Secondary | ICD-10-CM | POA: Diagnosis not present

## 2016-10-30 DIAGNOSIS — E1129 Type 2 diabetes mellitus with other diabetic kidney complication: Secondary | ICD-10-CM | POA: Diagnosis not present

## 2016-10-30 DIAGNOSIS — N186 End stage renal disease: Secondary | ICD-10-CM | POA: Diagnosis not present

## 2016-10-30 DIAGNOSIS — D631 Anemia in chronic kidney disease: Secondary | ICD-10-CM | POA: Diagnosis not present

## 2016-10-30 DIAGNOSIS — N2581 Secondary hyperparathyroidism of renal origin: Secondary | ICD-10-CM | POA: Diagnosis not present

## 2016-11-01 DIAGNOSIS — N2581 Secondary hyperparathyroidism of renal origin: Secondary | ICD-10-CM | POA: Diagnosis not present

## 2016-11-01 DIAGNOSIS — E1129 Type 2 diabetes mellitus with other diabetic kidney complication: Secondary | ICD-10-CM | POA: Diagnosis not present

## 2016-11-01 DIAGNOSIS — D631 Anemia in chronic kidney disease: Secondary | ICD-10-CM | POA: Diagnosis not present

## 2016-11-01 DIAGNOSIS — N186 End stage renal disease: Secondary | ICD-10-CM | POA: Diagnosis not present

## 2016-11-03 DIAGNOSIS — N186 End stage renal disease: Secondary | ICD-10-CM | POA: Diagnosis not present

## 2016-11-03 DIAGNOSIS — N2581 Secondary hyperparathyroidism of renal origin: Secondary | ICD-10-CM | POA: Diagnosis not present

## 2016-11-03 DIAGNOSIS — E1129 Type 2 diabetes mellitus with other diabetic kidney complication: Secondary | ICD-10-CM | POA: Diagnosis not present

## 2016-11-03 DIAGNOSIS — D631 Anemia in chronic kidney disease: Secondary | ICD-10-CM | POA: Diagnosis not present

## 2016-11-06 DIAGNOSIS — N2581 Secondary hyperparathyroidism of renal origin: Secondary | ICD-10-CM | POA: Diagnosis not present

## 2016-11-06 DIAGNOSIS — E1129 Type 2 diabetes mellitus with other diabetic kidney complication: Secondary | ICD-10-CM | POA: Diagnosis not present

## 2016-11-06 DIAGNOSIS — D631 Anemia in chronic kidney disease: Secondary | ICD-10-CM | POA: Diagnosis not present

## 2016-11-06 DIAGNOSIS — N186 End stage renal disease: Secondary | ICD-10-CM | POA: Diagnosis not present

## 2016-11-07 ENCOUNTER — Non-Acute Institutional Stay (SKILLED_NURSING_FACILITY): Payer: Medicare Other | Admitting: Adult Health

## 2016-11-07 ENCOUNTER — Encounter: Payer: Self-pay | Admitting: Adult Health

## 2016-11-07 DIAGNOSIS — E1122 Type 2 diabetes mellitus with diabetic chronic kidney disease: Secondary | ICD-10-CM

## 2016-11-07 DIAGNOSIS — I472 Ventricular tachycardia, unspecified: Secondary | ICD-10-CM

## 2016-11-07 DIAGNOSIS — I110 Hypertensive heart disease with heart failure: Secondary | ICD-10-CM

## 2016-11-07 DIAGNOSIS — E785 Hyperlipidemia, unspecified: Secondary | ICD-10-CM | POA: Diagnosis not present

## 2016-11-07 DIAGNOSIS — I739 Peripheral vascular disease, unspecified: Secondary | ICD-10-CM

## 2016-11-07 DIAGNOSIS — F22 Delusional disorders: Secondary | ICD-10-CM

## 2016-11-07 DIAGNOSIS — H9190 Unspecified hearing loss, unspecified ear: Secondary | ICD-10-CM

## 2016-11-07 DIAGNOSIS — N186 End stage renal disease: Secondary | ICD-10-CM

## 2016-11-07 DIAGNOSIS — R627 Adult failure to thrive: Secondary | ICD-10-CM

## 2016-11-07 DIAGNOSIS — H547 Unspecified visual loss: Secondary | ICD-10-CM | POA: Diagnosis not present

## 2016-11-07 DIAGNOSIS — E1169 Type 2 diabetes mellitus with other specified complication: Secondary | ICD-10-CM

## 2016-11-07 DIAGNOSIS — I5042 Chronic combined systolic (congestive) and diastolic (congestive) heart failure: Secondary | ICD-10-CM

## 2016-11-07 NOTE — Progress Notes (Signed)
Patient ID: [name], female   DOB: 08-16-1956, 60 y.o.   MRN: [id number]   Location:   [facility] Room Number: 941-D Place of Service:  SNF (31)   CODE STATUS: Full Code  No Known Allergies  Chief Complaint  Patient presents with  . Medical Management of Chronic Issues    Follow up    HPI:  She is a [facility] term resident of this facility being seen for the management of her chronic illnesses. Overall there is little change in her status. She continues with dialysis three days weekly. She is unable to participate in the hpi or ros. There are no nursing concerns at this time.   Past Medical History:  Diagnosis Date  . Anemia    Due to fibroids.  . Bilateral leg edema    a. Chronic.  Marland Kitchen Blind   . Cardiac arrest ([place]) 12/12/2014  . CHF (congestive heart failure) ([place])   . Coronary artery disease   . Deaf    USE SIGN INTERPRETER.  Divorced from husband but lives with him. Has daughter but she does not care for her.  . Diabetes mellitus    a. Per PCP note 2012 (A1C 9.2) - pt unwilling to take meds and was educated on risk of uncontrolled DM.  b. A1C 5.9 in 11/2013.  Marland Kitchen ESRD on hemodialysis ([place])    Mon, Wed, Fri  . Essential hypertension 08/09/2009   Qualifier: Diagnosis of  By: Sarita Haver  MD, Coralyn Mark    . Fibroid uterus   . Heart murmur   . Hyperlipidemia   . Hypertension    a. Has previously refused blood pressure meds.   . Leiomyoma of uterus 08/09/2009   Qualifier: Diagnosis of  By: Sarita Haver  MD, Coralyn Mark    . MI (myocardial infarction) 11/2014  . Moderate tricuspid regurgitation   . Osteomyelitis ([place])   . Psychiatric disorder    She frequently exhibits paranoia and has been diagnosed with psychotic d/o NOS during hospital stay in the past. She is tangetial and perseverative during her visits. She apparently has had a bad experience with mental health in [place] in the past and refuses to discuss mental issues for fear that she will be sent back there. Her paranoia,  communication issues, financial woes and lack of fam  . Pulmonary hypertension     Past Surgical History:  Procedure Laterality Date  . AMPUTATION Bilateral 04/02/2015   Procedure: Amputation Bilateral Great Toes MTP Joint;  Surgeon: Newt Minion, MD;  Location: [facility];  Service: Orthopedics;  Laterality: Bilateral;  . AMPUTATION Left 05/14/2015   Procedure: Left Transmetatarsal Amputation;  Surgeon: Newt Minion, MD;  Location: [facility];  Service: Orthopedics;  Laterality: Left;  . AMPUTATION Right 08/20/2015   Procedure: Transmetatarsal Amputation Right Foot;  Surgeon: Newt Minion, MD;  Location: [place] [facility];  Service: Orthopedics;  Laterality: Right;  . AV FISTULA PLACEMENT Left 01/02/2014   Procedure: ARTERIOVENOUS (AV) FISTULA CREATION;  Surgeon: Rosetta Posner, MD;  Location: [facility];  Service: Vascular;  Laterality: Left;  . INSERTION OF DIALYSIS CATHETER Right 01/02/2014   Procedure: INSERTION OF DIALYSIS CATHETER;  Surgeon: Rosetta Posner, MD;  Location: [facility];  Service: Vascular;  Laterality: Right;  . NO PAST SURGERIES    . ORIF PATELLA Left 05/17/2015   Procedure: OPEN REDUCTION INTERNAL (ORIF) FIXATION PATELLA;  Surgeon: Newt Minion, MD;  Location: [facility];  Service: Orthopedics;  Laterality: Left;    Social  History   Social History  . Marital status: Widowed    Spouse name: N/A  . Number of children: N/A  . Years of education: N/A   Occupational History  . Not on file.   Social History Main Topics  . Smoking status: Never Smoker  . Smokeless tobacco: Never Used     Comment: Prior dip  . Alcohol use No  . Drug use: No  . Sexual activity: Not on file   Other Topics Concern  . Not on file   Social History Narrative  . No narrative on file   Family History  Problem Relation Age of Onset  . Other Father     Drowned from fishing?  . Heart disease        VITAL SIGNS BP (!) 152/99   Pulse 76   Temp 98 F (36.7 C) (Oral)   Resp 18   Ht 5\' 6"  (1.676 m)   Wt 153 lb (69.4  kg)   SpO2 98%   BMI 24.69 kg/m   Patient's Medications  New Prescriptions   No medications on file  Previous Medications   AMIODARONE (PACERONE) 200 MG TABLET    Take 200 mg by mouth daily.    ARIPIPRAZOLE (ABILIFY) 10 MG TABLET    Take 10 mg by mouth at bedtime.    CALCIUM ACETATE (PHOSLO) 667 MG TABLET    Take 3 tablets by mouth before meals for dialysis   CLOPIDOGREL (PLAVIX) 75 MG TABLET    Take 75 mg by mouth every evening.   METOPROLOL SUCCINATE (TOPROL-XL) 50 MG 24 HR TABLET    Take 50 mg by mouth every evening. Take with or immediately following a meal.   NITROGLYCERIN (NITROSTAT) 0.4 MG SL TABLET    Place 0.4 mg under the tongue every 5 (five) minutes as needed for chest pain.   POLLEN EXTRACTS (PROSTAT PO)    Take 60 g by mouth 2 (two) times daily.   POLYETHYLENE GLYCOL (MIRALAX / GLYCOLAX) PACKET    Take 17 g by mouth daily.    TRAMADOL (ULTRAM) 50 MG TABLET    Take 50 mg by mouth daily.  Modified Medications   No medications on file  Discontinued Medications   No medications on file     SIGNIFICANT DIAGNOSTIC EXAMS  11-24-14: Myoview: 1. Fixed defects involving the apex, anterior lateral wall and the inferior wall. The apex and anterior lateral wall fixed defects aresuggestive for an infarct. No evidence for reversibility or ischemia. 2. Diffuse hypokinesia and minimal wall motion along the lateral wall. 3. Left ventricular ejection fraction is 35%. 4. High-risk stress test findings*.  04-16-15: 2-d echo: - Left ventricle: The cavity size was mildly dilated. Wall thickness was increased in a pattern of mild LVH. Systolic function was moderately to severely reduced. The estimated ejection fraction was in the range of 30% to 35%. There is akinesis of the anteroseptal and apical myocardium. Features are consistent with a pseudonormal left ventricular filling pattern, with concomitant abnormal relaxation and increased filling pressure (grade 2 diastolic dysfunction). Doppler  parameters are consistent with high ventricular filling pressure. - Mitral valve: Calcified annulus. There was mild regurgitation. - Left atrium: The atrium was mildly dilated. - Pulmonary arteries: Systolic pressure was mildly increased.   04-20-15: chest x-ray: Mild congestive heart failure suspected. Small focal opacity right upper lung field favored to be due to atelectasis or early airspace disease secondary to edema. Suggest attention on follow-up.  05-16-15: left knee x-ray: Comminuted  fracture involving the lower pole of the patella, with approximately 3 cm of separation at the fracture, and diffuse overlying soft tissue swelling. Associated moderate to large knee joint effusion noted.  05-16-15: left hip and pelvis x-ray: 1. No evidence of fracture or dislocation. 2. Calcified fibroids noted.      LABS REVIEWED: she will decline labs    12-01-15: wbc 5.2; hgb 12.3; hct 41.2; mcv 90.8; plt 213; glucose 116; bun 53.4; creat 4.77; k+ 4.1; na++ 137; liver normal albumin 3.4  tsh 1.85; hgb a1c 6.1  05-17-16: wbc 6.3; hgb 11.3; hct 33.5; mcv 93.9; plt 269; glucose 66; bun 34.3; creat 4.38; k+ 4.3; na++ 139; liver normal albumin 3.9; tsh 3.17; hgb a1c 5.4; chol 232; ldl 134; trig 90; hdl 80      Review of Systems Unable to perform ROS: Other    Physical Exam Constitutional: She appears well-developed and well-nourished. No distress.  Eyes: Conjunctivae are normal.  Neck: Neck supple. No JVD present. No thyromegaly present.  Cardiovascular: Normal rate, regular rhythm and intact distal pulses.  Murmur   Respiratory: Effort normal and breath sounds normal. No respiratory distress. She has no wheezes.  GI: Soft. Bowel sounds are normal. She exhibits no distension. There is no tenderness.  Musculoskeletal: She exhibits no edema.  Able to move all extremities  Has bilateral  transmetatarsal amputation; Lymphadenopathy:    She has no cervical adenopathy.  Neurological: She is alert.    Skin: Skin is warm and dry. She is not diaphoretic.  Left upper a/v fistula: +thril +bruit       ASSESSMENT/ PLAN:  1. ESRD on hemodialysis: is followed by nephrology. Will continue dialysis three times weekly. Will continue phoslo 667 mg 3 tabs  three times daily will monitor   2. Hypertension: is presently stable will continue toprol xl 50 mg daily   3. PAD: is without change is status post left and right transmetatarsal amputation: will continue plavix 75 mg daily  Will continue ultram 50 mg daily for pain management   4. Combine systolic and diastolic heart failure: EF is 30-35%  (04-16-15); is presently stable will continue toprol xl 50 mg daily   5. Sustained V tach: not a candidate for ICD. Is on amiodarone 200 mg daily for heart rate control will monitor   6. FTT: her current weight is 153 pounds; her albumin is 3.9. Will continue supplements per facility protocol.   7. Psychosis; paranoid : will continue abilify 10 mg daily . Will monitor   8. Constipation: will continue miralax daily   9. Diabetes is presently not on medications; will not make changes will monitor her status.   Will check cbc; cmp; hgb a1c lipids.     MD is aware of resident's narcotic use and is in agreement with current plan of care. We will attempt to wean resident as apropriate   Ok Edwards NP [facility]  Contact [phone] Monday through Friday 8am- 5pm  After hours call [phone]

## 2016-11-08 DIAGNOSIS — E1129 Type 2 diabetes mellitus with other diabetic kidney complication: Secondary | ICD-10-CM | POA: Diagnosis not present

## 2016-11-08 DIAGNOSIS — Z79899 Other long term (current) drug therapy: Secondary | ICD-10-CM | POA: Diagnosis not present

## 2016-11-08 DIAGNOSIS — I10 Essential (primary) hypertension: Secondary | ICD-10-CM | POA: Diagnosis not present

## 2016-11-08 DIAGNOSIS — N186 End stage renal disease: Secondary | ICD-10-CM | POA: Diagnosis not present

## 2016-11-08 DIAGNOSIS — N2581 Secondary hyperparathyroidism of renal origin: Secondary | ICD-10-CM | POA: Diagnosis not present

## 2016-11-08 DIAGNOSIS — D631 Anemia in chronic kidney disease: Secondary | ICD-10-CM | POA: Diagnosis not present

## 2016-11-08 LAB — HEPATIC FUNCTION PANEL
ALT: 16 U/L (ref 7–35)
AST: 12 U/L — AB (ref 13–35)
Alkaline Phosphatase: 59 U/L (ref 25–125)
Bilirubin, Total: 0.3 mg/dL

## 2016-11-08 LAB — CBC AND DIFFERENTIAL
HCT: 31 % — AB (ref 36–46)
Hemoglobin: 9.9 g/dL — AB (ref 12.0–16.0)
Neutrophils Absolute: 2 /uL
Platelets: 196 10*3/uL (ref 150–399)
WBC: 5.5 10^3/mL

## 2016-11-08 LAB — LIPID PANEL
Cholesterol: 182 mg/dL (ref 0–200)
HDL: 57 mg/dL (ref 35–70)
LDL Cholesterol: 117 mg/dL
Triglycerides: 37 mg/dL — AB (ref 40–160)

## 2016-11-08 LAB — BASIC METABOLIC PANEL
BUN: 71 mg/dL — AB (ref 4–21)
Creatinine: 6.6 mg/dL — AB (ref 0.5–1.1)
Glucose: 95 mg/dL
Potassium: 5 mmol/L (ref 3.4–5.3)
Sodium: 137 mmol/L (ref 137–147)

## 2016-11-10 DIAGNOSIS — E1129 Type 2 diabetes mellitus with other diabetic kidney complication: Secondary | ICD-10-CM | POA: Diagnosis not present

## 2016-11-10 DIAGNOSIS — N186 End stage renal disease: Secondary | ICD-10-CM | POA: Diagnosis not present

## 2016-11-10 DIAGNOSIS — N2581 Secondary hyperparathyroidism of renal origin: Secondary | ICD-10-CM | POA: Diagnosis not present

## 2016-11-10 DIAGNOSIS — D631 Anemia in chronic kidney disease: Secondary | ICD-10-CM | POA: Diagnosis not present

## 2016-11-12 DIAGNOSIS — D631 Anemia in chronic kidney disease: Secondary | ICD-10-CM | POA: Diagnosis not present

## 2016-11-12 DIAGNOSIS — N186 End stage renal disease: Secondary | ICD-10-CM | POA: Diagnosis not present

## 2016-11-12 DIAGNOSIS — N2581 Secondary hyperparathyroidism of renal origin: Secondary | ICD-10-CM | POA: Diagnosis not present

## 2016-11-12 DIAGNOSIS — E1129 Type 2 diabetes mellitus with other diabetic kidney complication: Secondary | ICD-10-CM | POA: Diagnosis not present

## 2016-11-14 DIAGNOSIS — D631 Anemia in chronic kidney disease: Secondary | ICD-10-CM | POA: Diagnosis not present

## 2016-11-14 DIAGNOSIS — E1129 Type 2 diabetes mellitus with other diabetic kidney complication: Secondary | ICD-10-CM | POA: Diagnosis not present

## 2016-11-14 DIAGNOSIS — N186 End stage renal disease: Secondary | ICD-10-CM | POA: Diagnosis not present

## 2016-11-14 DIAGNOSIS — N2581 Secondary hyperparathyroidism of renal origin: Secondary | ICD-10-CM | POA: Diagnosis not present

## 2016-11-17 DIAGNOSIS — N186 End stage renal disease: Secondary | ICD-10-CM | POA: Diagnosis not present

## 2016-11-17 DIAGNOSIS — N2581 Secondary hyperparathyroidism of renal origin: Secondary | ICD-10-CM | POA: Diagnosis not present

## 2016-11-17 DIAGNOSIS — D631 Anemia in chronic kidney disease: Secondary | ICD-10-CM | POA: Diagnosis not present

## 2016-11-17 DIAGNOSIS — E1129 Type 2 diabetes mellitus with other diabetic kidney complication: Secondary | ICD-10-CM | POA: Diagnosis not present

## 2016-11-20 DIAGNOSIS — D631 Anemia in chronic kidney disease: Secondary | ICD-10-CM | POA: Diagnosis not present

## 2016-11-20 DIAGNOSIS — E1129 Type 2 diabetes mellitus with other diabetic kidney complication: Secondary | ICD-10-CM | POA: Diagnosis not present

## 2016-11-20 DIAGNOSIS — N2581 Secondary hyperparathyroidism of renal origin: Secondary | ICD-10-CM | POA: Diagnosis not present

## 2016-11-20 DIAGNOSIS — N186 End stage renal disease: Secondary | ICD-10-CM | POA: Diagnosis not present

## 2016-11-22 DIAGNOSIS — E1129 Type 2 diabetes mellitus with other diabetic kidney complication: Secondary | ICD-10-CM | POA: Diagnosis not present

## 2016-11-22 DIAGNOSIS — N2581 Secondary hyperparathyroidism of renal origin: Secondary | ICD-10-CM | POA: Diagnosis not present

## 2016-11-22 DIAGNOSIS — D631 Anemia in chronic kidney disease: Secondary | ICD-10-CM | POA: Diagnosis not present

## 2016-11-22 DIAGNOSIS — N186 End stage renal disease: Secondary | ICD-10-CM | POA: Diagnosis not present

## 2016-11-23 DIAGNOSIS — Z992 Dependence on renal dialysis: Secondary | ICD-10-CM | POA: Diagnosis not present

## 2016-11-23 DIAGNOSIS — E1129 Type 2 diabetes mellitus with other diabetic kidney complication: Secondary | ICD-10-CM | POA: Diagnosis not present

## 2016-11-23 DIAGNOSIS — F329 Major depressive disorder, single episode, unspecified: Secondary | ICD-10-CM | POA: Diagnosis not present

## 2016-11-23 DIAGNOSIS — N186 End stage renal disease: Secondary | ICD-10-CM | POA: Diagnosis not present

## 2016-11-23 DIAGNOSIS — F209 Schizophrenia, unspecified: Secondary | ICD-10-CM | POA: Diagnosis not present

## 2016-11-24 DIAGNOSIS — D631 Anemia in chronic kidney disease: Secondary | ICD-10-CM | POA: Diagnosis not present

## 2016-11-24 DIAGNOSIS — N186 End stage renal disease: Secondary | ICD-10-CM | POA: Diagnosis not present

## 2016-11-24 DIAGNOSIS — N2581 Secondary hyperparathyroidism of renal origin: Secondary | ICD-10-CM | POA: Diagnosis not present

## 2016-11-24 DIAGNOSIS — E1129 Type 2 diabetes mellitus with other diabetic kidney complication: Secondary | ICD-10-CM | POA: Diagnosis not present

## 2016-11-27 DIAGNOSIS — E1129 Type 2 diabetes mellitus with other diabetic kidney complication: Secondary | ICD-10-CM | POA: Diagnosis not present

## 2016-11-27 DIAGNOSIS — N186 End stage renal disease: Secondary | ICD-10-CM | POA: Diagnosis not present

## 2016-11-27 DIAGNOSIS — D631 Anemia in chronic kidney disease: Secondary | ICD-10-CM | POA: Diagnosis not present

## 2016-11-27 DIAGNOSIS — N2581 Secondary hyperparathyroidism of renal origin: Secondary | ICD-10-CM | POA: Diagnosis not present

## 2016-11-29 DIAGNOSIS — N186 End stage renal disease: Secondary | ICD-10-CM | POA: Diagnosis not present

## 2016-11-29 DIAGNOSIS — N2581 Secondary hyperparathyroidism of renal origin: Secondary | ICD-10-CM | POA: Diagnosis not present

## 2016-11-29 DIAGNOSIS — D631 Anemia in chronic kidney disease: Secondary | ICD-10-CM | POA: Diagnosis not present

## 2016-11-29 DIAGNOSIS — E1129 Type 2 diabetes mellitus with other diabetic kidney complication: Secondary | ICD-10-CM | POA: Diagnosis not present

## 2016-12-01 DIAGNOSIS — D631 Anemia in chronic kidney disease: Secondary | ICD-10-CM | POA: Diagnosis not present

## 2016-12-01 DIAGNOSIS — N2581 Secondary hyperparathyroidism of renal origin: Secondary | ICD-10-CM | POA: Diagnosis not present

## 2016-12-01 DIAGNOSIS — N186 End stage renal disease: Secondary | ICD-10-CM | POA: Diagnosis not present

## 2016-12-01 DIAGNOSIS — E1129 Type 2 diabetes mellitus with other diabetic kidney complication: Secondary | ICD-10-CM | POA: Diagnosis not present

## 2016-12-04 DIAGNOSIS — N186 End stage renal disease: Secondary | ICD-10-CM | POA: Diagnosis not present

## 2016-12-04 DIAGNOSIS — D631 Anemia in chronic kidney disease: Secondary | ICD-10-CM | POA: Diagnosis not present

## 2016-12-04 DIAGNOSIS — E1129 Type 2 diabetes mellitus with other diabetic kidney complication: Secondary | ICD-10-CM | POA: Diagnosis not present

## 2016-12-04 DIAGNOSIS — N2581 Secondary hyperparathyroidism of renal origin: Secondary | ICD-10-CM | POA: Diagnosis not present

## 2016-12-05 DIAGNOSIS — F209 Schizophrenia, unspecified: Secondary | ICD-10-CM | POA: Diagnosis not present

## 2016-12-05 DIAGNOSIS — F329 Major depressive disorder, single episode, unspecified: Secondary | ICD-10-CM | POA: Diagnosis not present

## 2016-12-06 DIAGNOSIS — N2581 Secondary hyperparathyroidism of renal origin: Secondary | ICD-10-CM | POA: Diagnosis not present

## 2016-12-06 DIAGNOSIS — D631 Anemia in chronic kidney disease: Secondary | ICD-10-CM | POA: Diagnosis not present

## 2016-12-06 DIAGNOSIS — E1129 Type 2 diabetes mellitus with other diabetic kidney complication: Secondary | ICD-10-CM | POA: Diagnosis not present

## 2016-12-06 DIAGNOSIS — N186 End stage renal disease: Secondary | ICD-10-CM | POA: Diagnosis not present

## 2016-12-08 ENCOUNTER — Encounter: Payer: Self-pay | Admitting: Adult Health

## 2016-12-08 ENCOUNTER — Non-Acute Institutional Stay (SKILLED_NURSING_FACILITY): Payer: Medicare Other | Admitting: Adult Health

## 2016-12-08 DIAGNOSIS — N186 End stage renal disease: Secondary | ICD-10-CM

## 2016-12-08 DIAGNOSIS — N2581 Secondary hyperparathyroidism of renal origin: Secondary | ICD-10-CM | POA: Diagnosis not present

## 2016-12-08 DIAGNOSIS — I255 Ischemic cardiomyopathy: Secondary | ICD-10-CM | POA: Diagnosis not present

## 2016-12-08 DIAGNOSIS — E1122 Type 2 diabetes mellitus with diabetic chronic kidney disease: Secondary | ICD-10-CM

## 2016-12-08 DIAGNOSIS — I5042 Chronic combined systolic (congestive) and diastolic (congestive) heart failure: Secondary | ICD-10-CM | POA: Diagnosis not present

## 2016-12-08 DIAGNOSIS — I110 Hypertensive heart disease with heart failure: Secondary | ICD-10-CM

## 2016-12-08 DIAGNOSIS — E1129 Type 2 diabetes mellitus with other diabetic kidney complication: Secondary | ICD-10-CM | POA: Diagnosis not present

## 2016-12-08 DIAGNOSIS — D631 Anemia in chronic kidney disease: Secondary | ICD-10-CM | POA: Diagnosis not present

## 2016-12-08 NOTE — Progress Notes (Signed)
Patient ID: [name], female   DOB: 27-Sep-1956, 60 y.o.   MRN: [id number]   Location:    [place] Room Number: 741-O Place of Service:  SNF (31)   CODE STATUS: Full Code  No Known Allergies  Chief Complaint  Patient presents with  . Annual Exam    Annual    HPI:  She is a [place] term resident of this facility being seen for her annual exam. Overall there is little change in her status. She has not required any hospitalizations over the past year. She has been consistent with her dialysis treatments. She is unable to fully participate in the hpi or ros. There are no nursing concerns at this time.   Past Medical History:  Diagnosis Date  . Anemia    Due to fibroids.  . Bilateral leg edema    a. Chronic.  Marland Kitchen Blind   . Cardiac arrest ([place]) 12/12/2014  . CHF (congestive heart failure) ([place])   . Coronary artery disease   . Deaf    USE SIGN INTERPRETER.  Divorced from husband but lives with him. Has daughter but she does not care for her.  . Diabetes mellitus    a. Per PCP note 2012 (A1C 9.2) - pt unwilling to take meds and was educated on risk of uncontrolled DM.  b. A1C 5.9 in 11/2013.  Marland Kitchen ESRD on hemodialysis ([place])    Mon, Wed, Fri  . Essential hypertension 08/09/2009   Qualifier: Diagnosis of  By: Sarita Haver  MD, Coralyn Mark    . Fibroid uterus   . Heart murmur   . Hyperlipidemia   . Hypertension    a. Has previously refused blood pressure meds.   . Leiomyoma of uterus 08/09/2009   Qualifier: Diagnosis of  By: Sarita Haver  MD, Coralyn Mark    . MI (myocardial infarction) 11/2014  . Moderate tricuspid regurgitation   . Osteomyelitis ([place])   . Psychiatric disorder    She frequently exhibits paranoia and has been diagnosed with psychotic d/o NOS during hospital stay in the past. She is tangetial and perseverative during her visits. She apparently has had a bad experience with mental health in [place] in the past and refuses to discuss mental issues for fear that she will be sent  back there. Her paranoia, communication issues, financial woes and lack of fam  . Pulmonary hypertension     Past Surgical History:  Procedure Laterality Date  . AMPUTATION Bilateral 04/02/2015   Procedure: Amputation Bilateral Great Toes MTP Joint;  Surgeon: Newt Minion, MD;  Location: [place];  Service: Orthopedics;  Laterality: Bilateral;  . AMPUTATION Left 05/14/2015   Procedure: Left Transmetatarsal Amputation;  Surgeon: Newt Minion, MD;  Location: [facility];  Service: Orthopedics;  Laterality: Left;  . AMPUTATION Right 08/20/2015   Procedure: Transmetatarsal Amputation Right Foot;  Surgeon: Newt Minion, MD;  Location: [place] [facility];  Service: Orthopedics;  Laterality: Right;  . AV FISTULA PLACEMENT Left 01/02/2014   Procedure: ARTERIOVENOUS (AV) FISTULA CREATION;  Surgeon: Rosetta Posner, MD;  Location: [place];  Service: Vascular;  Laterality: Left;  . INSERTION OF DIALYSIS CATHETER Right 01/02/2014   Procedure: INSERTION OF DIALYSIS CATHETER;  Surgeon: Rosetta Posner, MD;  Location: [place];  Service: Vascular;  Laterality: Right;  . NO PAST SURGERIES    . ORIF PATELLA Left 05/17/2015   Procedure: OPEN REDUCTION INTERNAL (ORIF) FIXATION PATELLA;  Surgeon: Newt Minion, MD;  Location: [place];  Service: Orthopedics;  Laterality: Left;    Social History   Social History  . Marital status: Widowed    Spouse name: N/A  . Number of children: N/A  . Years of education: N/A   Occupational History  . Not on file.   Social History Main Topics  . Smoking status: Never Smoker  . Smokeless tobacco: Never Used     Comment: Prior dip  . Alcohol use No  . Drug use: No  . Sexual activity: Not on file   Other Topics Concern  . Not on file   Social History Narrative  . No narrative on file   Family History  Problem Relation Age of Onset  . Other Father     Drowned from fishing?  . Heart disease        VITAL SIGNS BP 122/62   Pulse 78   Temp 98.6 F (37 C) (Oral)   Resp 18   Ht 5\' 6"  (1.676  m)   Wt 152 lb (68.9 kg)   SpO2 98%   BMI 24.53 kg/m   Patient's Medications  New Prescriptions   No medications on file  Previous Medications   AMIODARONE (PACERONE) 200 MG TABLET    Take 200 mg by mouth daily.    ARIPIPRAZOLE (ABILIFY) 10 MG TABLET    Take 10 mg by mouth at bedtime.    CALCIUM ACETATE (PHOSLO) 667 MG TABLET    Take 3 tablets by mouth before meals for dialysis   CLOPIDOGREL (PLAVIX) 75 MG TABLET    Take 75 mg by mouth every evening.   METOPROLOL SUCCINATE (TOPROL-XL) 50 MG 24 HR TABLET    Take 50 mg by mouth every evening. Take with or immediately following a meal.   NITROGLYCERIN (NITROSTAT) 0.4 MG SL TABLET    Place 0.4 mg under the tongue every 5 (five) minutes as needed for chest pain.   POLLEN EXTRACTS (PROSTAT PO)    Take 60 g by mouth 2 (two) times daily.   POLYETHYLENE GLYCOL (MIRALAX / GLYCOLAX) PACKET    Take 17 g by mouth daily.    TRAMADOL (ULTRAM) 50 MG TABLET    Take 50 mg by mouth daily.  Modified Medications   No medications on file  Discontinued Medications   No medications on file     SIGNIFICANT DIAGNOSTIC EXAMS  11-24-14: Myoview: 1. Fixed defects involving the apex, anterior lateral wall and the inferior wall. The apex and anterior lateral wall fixed defects aresuggestive for an infarct. No evidence for reversibility or ischemia. 2. Diffuse hypokinesia and minimal wall motion along the lateral wall. 3. Left ventricular ejection fraction is 35%. 4. High-risk stress test findings*.  04-16-15: 2-d echo: - Left ventricle: The cavity size was mildly dilated. Wall thickness was increased in a pattern of mild LVH. Systolic function was moderately to severely reduced. The estimated ejection fraction was in the range of 30% to 35%. There is akinesis of the anteroseptal and apical myocardium. Features are consistent with a pseudonormal left ventricular filling pattern, with concomitant abnormal relaxation and increased filling pressure (grade 2 diastolic  dysfunction). Doppler parameters are consistent with high ventricular filling pressure. - Mitral valve: Calcified annulus. There was mild regurgitation. - Left atrium: The atrium was mildly dilated. - Pulmonary arteries: Systolic pressure was mildly increased.     LABS REVIEWED: she will decline labs    12-01-15: wbc 5.2; hgb 12.3; hct 41.2; mcv 90.8; plt 213; glucose 116; bun 53.4; creat 4.77; k+ 4.1; na++ 137; liver  normal albumin 3.4  tsh 1.85; hgb a1c 6.1  05-17-16: wbc 6.3; hgb 11.3; hct 33.5; mcv 93.9; plt 269; glucose 66; bun 34.3; creat 4.38; k+ 4.3; na++ 139; liver normal albumin 3.9; tsh 3.17; hgb a1c 5.4; chol 232; ldl 134; trig 90; hdl 80 11-08-16: wbc 5.5; hgb 9.9; hct 31.2; mcv 99.3; plt 196; glucose 95; bun 71,2; creat 6.55; k+ 5.0; na++ 137; liver normal albumin 4.0; hgb a1c 5.4      Review of Systems Unable to perform ROS: Other    Physical Exam Constitutional: She appears well-developed and well-nourished. No distress.  Eyes: Conjunctivae are normal.  Neck: Neck supple. No JVD present. No thyromegaly present.  Cardiovascular: Normal rate, regular rhythm and intact distal pulses.  Murmur   Respiratory: Effort normal and breath sounds normal. No respiratory distress. She has no wheezes.  GI: Soft. Bowel sounds are normal. She exhibits no distension. There is no tenderness.  Musculoskeletal: She exhibits no edema.  Able to move all extremities  Has bilateral  transmetatarsal amputation; Lymphadenopathy:    She has no cervical adenopathy.  Neurological: She is alert.  Skin: Skin is warm and dry. She is not diaphoretic.  Left upper a/v fistula: +thril +bruit       ASSESSMENT/ PLAN:  1. ESRD on hemodialysis: is followed by nephrology. Will continue dialysis three times weekly. Will continue phoslo 667 mg 3 tabs  three times daily will monitor   2. Hypertension: is presently stable will continue toprol xl 50 mg daily   3. PAD: is without change is status post  left and right transmetatarsal amputation: will continue plavix 75 mg daily  Will continue ultram 50 mg daily for pain management   4. 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 Sustained V tach: not a candidate for ICD. Is on amiodarone 200 mg daily for heart rate control will monitor   6. FTT: her current weight is 151 pounds; her albumin is 4.0. Will continue supplements per facility protocol.   7. Psychosis; paranoid : will continue abilify 10 mg daily . Will monitor   8. Constipation: will continue miralax daily   9. Diabetes is presently not on medications; will not make changes will monitor her status.    Her health maintenance is up to date  Time spent with patient   45 minutes >50% time spent counseling; reviewing medical record; tests; labs; and developing future plan of care    Ok Edwards NP [facility]  Contact [phone] Monday through Friday 8am- 5pm  After hours call [phone]

## 2016-12-11 DIAGNOSIS — N2581 Secondary hyperparathyroidism of renal origin: Secondary | ICD-10-CM | POA: Diagnosis not present

## 2016-12-11 DIAGNOSIS — D631 Anemia in chronic kidney disease: Secondary | ICD-10-CM | POA: Diagnosis not present

## 2016-12-11 DIAGNOSIS — E1129 Type 2 diabetes mellitus with other diabetic kidney complication: Secondary | ICD-10-CM | POA: Diagnosis not present

## 2016-12-11 DIAGNOSIS — N186 End stage renal disease: Secondary | ICD-10-CM | POA: Diagnosis not present

## 2016-12-13 DIAGNOSIS — N2581 Secondary hyperparathyroidism of renal origin: Secondary | ICD-10-CM | POA: Diagnosis not present

## 2016-12-13 DIAGNOSIS — N186 End stage renal disease: Secondary | ICD-10-CM | POA: Diagnosis not present

## 2016-12-13 DIAGNOSIS — E1129 Type 2 diabetes mellitus with other diabetic kidney complication: Secondary | ICD-10-CM | POA: Diagnosis not present

## 2016-12-13 DIAGNOSIS — D631 Anemia in chronic kidney disease: Secondary | ICD-10-CM | POA: Diagnosis not present

## 2016-12-15 DIAGNOSIS — D631 Anemia in chronic kidney disease: Secondary | ICD-10-CM | POA: Diagnosis not present

## 2016-12-15 DIAGNOSIS — N186 End stage renal disease: Secondary | ICD-10-CM | POA: Diagnosis not present

## 2016-12-15 DIAGNOSIS — N2581 Secondary hyperparathyroidism of renal origin: Secondary | ICD-10-CM | POA: Diagnosis not present

## 2016-12-15 DIAGNOSIS — E1129 Type 2 diabetes mellitus with other diabetic kidney complication: Secondary | ICD-10-CM | POA: Diagnosis not present

## 2016-12-17 DIAGNOSIS — N186 End stage renal disease: Secondary | ICD-10-CM | POA: Diagnosis not present

## 2016-12-17 DIAGNOSIS — D631 Anemia in chronic kidney disease: Secondary | ICD-10-CM | POA: Diagnosis not present

## 2016-12-17 DIAGNOSIS — N2581 Secondary hyperparathyroidism of renal origin: Secondary | ICD-10-CM | POA: Diagnosis not present

## 2016-12-17 DIAGNOSIS — E1129 Type 2 diabetes mellitus with other diabetic kidney complication: Secondary | ICD-10-CM | POA: Diagnosis not present

## 2016-12-20 DIAGNOSIS — D631 Anemia in chronic kidney disease: Secondary | ICD-10-CM | POA: Diagnosis not present

## 2016-12-20 DIAGNOSIS — N186 End stage renal disease: Secondary | ICD-10-CM | POA: Diagnosis not present

## 2016-12-20 DIAGNOSIS — E1129 Type 2 diabetes mellitus with other diabetic kidney complication: Secondary | ICD-10-CM | POA: Diagnosis not present

## 2016-12-20 DIAGNOSIS — N2581 Secondary hyperparathyroidism of renal origin: Secondary | ICD-10-CM | POA: Diagnosis not present

## 2016-12-22 DIAGNOSIS — N2581 Secondary hyperparathyroidism of renal origin: Secondary | ICD-10-CM | POA: Diagnosis not present

## 2016-12-22 DIAGNOSIS — E1129 Type 2 diabetes mellitus with other diabetic kidney complication: Secondary | ICD-10-CM | POA: Diagnosis not present

## 2016-12-22 DIAGNOSIS — D631 Anemia in chronic kidney disease: Secondary | ICD-10-CM | POA: Diagnosis not present

## 2016-12-22 DIAGNOSIS — N186 End stage renal disease: Secondary | ICD-10-CM | POA: Diagnosis not present

## 2016-12-24 DIAGNOSIS — E1129 Type 2 diabetes mellitus with other diabetic kidney complication: Secondary | ICD-10-CM | POA: Diagnosis not present

## 2016-12-24 DIAGNOSIS — D631 Anemia in chronic kidney disease: Secondary | ICD-10-CM | POA: Diagnosis not present

## 2016-12-24 DIAGNOSIS — N2581 Secondary hyperparathyroidism of renal origin: Secondary | ICD-10-CM | POA: Diagnosis not present

## 2016-12-24 DIAGNOSIS — Z992 Dependence on renal dialysis: Secondary | ICD-10-CM | POA: Diagnosis not present

## 2016-12-24 DIAGNOSIS — N186 End stage renal disease: Secondary | ICD-10-CM | POA: Diagnosis not present

## 2016-12-27 DIAGNOSIS — Z23 Encounter for immunization: Secondary | ICD-10-CM | POA: Diagnosis not present

## 2016-12-27 DIAGNOSIS — N2581 Secondary hyperparathyroidism of renal origin: Secondary | ICD-10-CM | POA: Diagnosis not present

## 2016-12-27 DIAGNOSIS — D631 Anemia in chronic kidney disease: Secondary | ICD-10-CM | POA: Diagnosis not present

## 2016-12-27 DIAGNOSIS — E1129 Type 2 diabetes mellitus with other diabetic kidney complication: Secondary | ICD-10-CM | POA: Diagnosis not present

## 2016-12-27 DIAGNOSIS — N186 End stage renal disease: Secondary | ICD-10-CM | POA: Diagnosis not present

## 2016-12-29 DIAGNOSIS — E1129 Type 2 diabetes mellitus with other diabetic kidney complication: Secondary | ICD-10-CM | POA: Diagnosis not present

## 2016-12-29 DIAGNOSIS — N2581 Secondary hyperparathyroidism of renal origin: Secondary | ICD-10-CM | POA: Diagnosis not present

## 2016-12-29 DIAGNOSIS — N186 End stage renal disease: Secondary | ICD-10-CM | POA: Diagnosis not present

## 2016-12-29 DIAGNOSIS — D631 Anemia in chronic kidney disease: Secondary | ICD-10-CM | POA: Diagnosis not present

## 2016-12-29 DIAGNOSIS — Z23 Encounter for immunization: Secondary | ICD-10-CM | POA: Diagnosis not present

## 2017-01-01 DIAGNOSIS — N2581 Secondary hyperparathyroidism of renal origin: Secondary | ICD-10-CM | POA: Diagnosis not present

## 2017-01-01 DIAGNOSIS — Z23 Encounter for immunization: Secondary | ICD-10-CM | POA: Diagnosis not present

## 2017-01-01 DIAGNOSIS — E1129 Type 2 diabetes mellitus with other diabetic kidney complication: Secondary | ICD-10-CM | POA: Diagnosis not present

## 2017-01-01 DIAGNOSIS — D631 Anemia in chronic kidney disease: Secondary | ICD-10-CM | POA: Diagnosis not present

## 2017-01-01 DIAGNOSIS — N186 End stage renal disease: Secondary | ICD-10-CM | POA: Diagnosis not present

## 2017-01-03 DIAGNOSIS — E1129 Type 2 diabetes mellitus with other diabetic kidney complication: Secondary | ICD-10-CM | POA: Diagnosis not present

## 2017-01-03 DIAGNOSIS — D631 Anemia in chronic kidney disease: Secondary | ICD-10-CM | POA: Diagnosis not present

## 2017-01-03 DIAGNOSIS — N186 End stage renal disease: Secondary | ICD-10-CM | POA: Diagnosis not present

## 2017-01-03 DIAGNOSIS — Z23 Encounter for immunization: Secondary | ICD-10-CM | POA: Diagnosis not present

## 2017-01-03 DIAGNOSIS — N2581 Secondary hyperparathyroidism of renal origin: Secondary | ICD-10-CM | POA: Diagnosis not present

## 2017-01-05 DIAGNOSIS — N2581 Secondary hyperparathyroidism of renal origin: Secondary | ICD-10-CM | POA: Diagnosis not present

## 2017-01-05 DIAGNOSIS — Z23 Encounter for immunization: Secondary | ICD-10-CM | POA: Diagnosis not present

## 2017-01-05 DIAGNOSIS — E1129 Type 2 diabetes mellitus with other diabetic kidney complication: Secondary | ICD-10-CM | POA: Diagnosis not present

## 2017-01-05 DIAGNOSIS — N186 End stage renal disease: Secondary | ICD-10-CM | POA: Diagnosis not present

## 2017-01-05 DIAGNOSIS — D631 Anemia in chronic kidney disease: Secondary | ICD-10-CM | POA: Diagnosis not present

## 2017-01-08 DIAGNOSIS — Z23 Encounter for immunization: Secondary | ICD-10-CM | POA: Diagnosis not present

## 2017-01-08 DIAGNOSIS — E1129 Type 2 diabetes mellitus with other diabetic kidney complication: Secondary | ICD-10-CM | POA: Diagnosis not present

## 2017-01-08 DIAGNOSIS — N2581 Secondary hyperparathyroidism of renal origin: Secondary | ICD-10-CM | POA: Diagnosis not present

## 2017-01-08 DIAGNOSIS — D631 Anemia in chronic kidney disease: Secondary | ICD-10-CM | POA: Diagnosis not present

## 2017-01-08 DIAGNOSIS — N186 End stage renal disease: Secondary | ICD-10-CM | POA: Diagnosis not present

## 2017-01-10 DIAGNOSIS — N186 End stage renal disease: Secondary | ICD-10-CM | POA: Diagnosis not present

## 2017-01-10 DIAGNOSIS — D631 Anemia in chronic kidney disease: Secondary | ICD-10-CM | POA: Diagnosis not present

## 2017-01-10 DIAGNOSIS — Z23 Encounter for immunization: Secondary | ICD-10-CM | POA: Diagnosis not present

## 2017-01-10 DIAGNOSIS — E1129 Type 2 diabetes mellitus with other diabetic kidney complication: Secondary | ICD-10-CM | POA: Diagnosis not present

## 2017-01-10 DIAGNOSIS — R531 Weakness: Secondary | ICD-10-CM | POA: Diagnosis not present

## 2017-01-10 DIAGNOSIS — N189 Chronic kidney disease, unspecified: Secondary | ICD-10-CM | POA: Diagnosis not present

## 2017-01-10 DIAGNOSIS — N2581 Secondary hyperparathyroidism of renal origin: Secondary | ICD-10-CM | POA: Diagnosis not present

## 2017-01-12 DIAGNOSIS — N186 End stage renal disease: Secondary | ICD-10-CM | POA: Diagnosis not present

## 2017-01-12 DIAGNOSIS — N2581 Secondary hyperparathyroidism of renal origin: Secondary | ICD-10-CM | POA: Diagnosis not present

## 2017-01-12 DIAGNOSIS — Z23 Encounter for immunization: Secondary | ICD-10-CM | POA: Diagnosis not present

## 2017-01-12 DIAGNOSIS — E1129 Type 2 diabetes mellitus with other diabetic kidney complication: Secondary | ICD-10-CM | POA: Diagnosis not present

## 2017-01-12 DIAGNOSIS — D631 Anemia in chronic kidney disease: Secondary | ICD-10-CM | POA: Diagnosis not present

## 2017-01-15 DIAGNOSIS — E1129 Type 2 diabetes mellitus with other diabetic kidney complication: Secondary | ICD-10-CM | POA: Diagnosis not present

## 2017-01-15 DIAGNOSIS — N2581 Secondary hyperparathyroidism of renal origin: Secondary | ICD-10-CM | POA: Diagnosis not present

## 2017-01-15 DIAGNOSIS — D631 Anemia in chronic kidney disease: Secondary | ICD-10-CM | POA: Diagnosis not present

## 2017-01-15 DIAGNOSIS — N186 End stage renal disease: Secondary | ICD-10-CM | POA: Diagnosis not present

## 2017-01-15 DIAGNOSIS — Z23 Encounter for immunization: Secondary | ICD-10-CM | POA: Diagnosis not present

## 2017-01-17 DIAGNOSIS — N186 End stage renal disease: Secondary | ICD-10-CM | POA: Diagnosis not present

## 2017-01-17 DIAGNOSIS — E1129 Type 2 diabetes mellitus with other diabetic kidney complication: Secondary | ICD-10-CM | POA: Diagnosis not present

## 2017-01-17 DIAGNOSIS — N2581 Secondary hyperparathyroidism of renal origin: Secondary | ICD-10-CM | POA: Diagnosis not present

## 2017-01-17 DIAGNOSIS — D631 Anemia in chronic kidney disease: Secondary | ICD-10-CM | POA: Diagnosis not present

## 2017-01-17 DIAGNOSIS — Z23 Encounter for immunization: Secondary | ICD-10-CM | POA: Diagnosis not present

## 2017-01-19 DIAGNOSIS — N2581 Secondary hyperparathyroidism of renal origin: Secondary | ICD-10-CM | POA: Diagnosis not present

## 2017-01-19 DIAGNOSIS — E1129 Type 2 diabetes mellitus with other diabetic kidney complication: Secondary | ICD-10-CM | POA: Diagnosis not present

## 2017-01-19 DIAGNOSIS — N186 End stage renal disease: Secondary | ICD-10-CM | POA: Diagnosis not present

## 2017-01-19 DIAGNOSIS — Z23 Encounter for immunization: Secondary | ICD-10-CM | POA: Diagnosis not present

## 2017-01-19 DIAGNOSIS — D631 Anemia in chronic kidney disease: Secondary | ICD-10-CM | POA: Diagnosis not present

## 2017-01-22 DIAGNOSIS — E1129 Type 2 diabetes mellitus with other diabetic kidney complication: Secondary | ICD-10-CM | POA: Diagnosis not present

## 2017-01-22 DIAGNOSIS — D631 Anemia in chronic kidney disease: Secondary | ICD-10-CM | POA: Diagnosis not present

## 2017-01-22 DIAGNOSIS — N2581 Secondary hyperparathyroidism of renal origin: Secondary | ICD-10-CM | POA: Diagnosis not present

## 2017-01-22 DIAGNOSIS — Z23 Encounter for immunization: Secondary | ICD-10-CM | POA: Diagnosis not present

## 2017-01-22 DIAGNOSIS — N186 End stage renal disease: Secondary | ICD-10-CM | POA: Diagnosis not present

## 2017-01-24 DIAGNOSIS — E1129 Type 2 diabetes mellitus with other diabetic kidney complication: Secondary | ICD-10-CM | POA: Diagnosis not present

## 2017-01-24 DIAGNOSIS — N186 End stage renal disease: Secondary | ICD-10-CM | POA: Diagnosis not present

## 2017-01-24 DIAGNOSIS — Z23 Encounter for immunization: Secondary | ICD-10-CM | POA: Diagnosis not present

## 2017-01-24 DIAGNOSIS — D631 Anemia in chronic kidney disease: Secondary | ICD-10-CM | POA: Diagnosis not present

## 2017-01-24 DIAGNOSIS — N2581 Secondary hyperparathyroidism of renal origin: Secondary | ICD-10-CM | POA: Diagnosis not present

## 2017-01-24 DIAGNOSIS — Z992 Dependence on renal dialysis: Secondary | ICD-10-CM | POA: Diagnosis not present

## 2017-01-26 ENCOUNTER — Non-Acute Institutional Stay (SKILLED_NURSING_FACILITY): Payer: Medicare Other | Admitting: Adult Health

## 2017-01-26 DIAGNOSIS — I472 Ventricular tachycardia, unspecified: Secondary | ICD-10-CM

## 2017-01-26 DIAGNOSIS — N186 End stage renal disease: Secondary | ICD-10-CM | POA: Diagnosis not present

## 2017-01-26 DIAGNOSIS — H547 Unspecified visual loss: Secondary | ICD-10-CM

## 2017-01-26 DIAGNOSIS — E785 Hyperlipidemia, unspecified: Secondary | ICD-10-CM

## 2017-01-26 DIAGNOSIS — E1169 Type 2 diabetes mellitus with other specified complication: Secondary | ICD-10-CM | POA: Diagnosis not present

## 2017-01-26 DIAGNOSIS — N2581 Secondary hyperparathyroidism of renal origin: Secondary | ICD-10-CM | POA: Diagnosis not present

## 2017-01-26 DIAGNOSIS — Z91148 Patient's other noncompliance with medication regimen for other reason: Secondary | ICD-10-CM

## 2017-01-26 DIAGNOSIS — D631 Anemia in chronic kidney disease: Secondary | ICD-10-CM | POA: Diagnosis not present

## 2017-01-26 DIAGNOSIS — E1122 Type 2 diabetes mellitus with diabetic chronic kidney disease: Secondary | ICD-10-CM

## 2017-01-26 DIAGNOSIS — R627 Adult failure to thrive: Secondary | ICD-10-CM | POA: Diagnosis not present

## 2017-01-26 DIAGNOSIS — I5042 Chronic combined systolic (congestive) and diastolic (congestive) heart failure: Secondary | ICD-10-CM

## 2017-01-26 DIAGNOSIS — F22 Delusional disorders: Secondary | ICD-10-CM

## 2017-01-26 DIAGNOSIS — Z9114 Patient's other noncompliance with medication regimen: Secondary | ICD-10-CM | POA: Diagnosis not present

## 2017-01-26 DIAGNOSIS — E1129 Type 2 diabetes mellitus with other diabetic kidney complication: Secondary | ICD-10-CM | POA: Diagnosis not present

## 2017-01-26 DIAGNOSIS — H9190 Unspecified hearing loss, unspecified ear: Secondary | ICD-10-CM

## 2017-01-29 DIAGNOSIS — E1129 Type 2 diabetes mellitus with other diabetic kidney complication: Secondary | ICD-10-CM | POA: Diagnosis not present

## 2017-01-29 DIAGNOSIS — N2581 Secondary hyperparathyroidism of renal origin: Secondary | ICD-10-CM | POA: Diagnosis not present

## 2017-01-29 DIAGNOSIS — N186 End stage renal disease: Secondary | ICD-10-CM | POA: Diagnosis not present

## 2017-01-29 DIAGNOSIS — D631 Anemia in chronic kidney disease: Secondary | ICD-10-CM | POA: Diagnosis not present

## 2017-01-31 DIAGNOSIS — E1129 Type 2 diabetes mellitus with other diabetic kidney complication: Secondary | ICD-10-CM | POA: Diagnosis not present

## 2017-01-31 DIAGNOSIS — N186 End stage renal disease: Secondary | ICD-10-CM | POA: Diagnosis not present

## 2017-01-31 DIAGNOSIS — D631 Anemia in chronic kidney disease: Secondary | ICD-10-CM | POA: Diagnosis not present

## 2017-01-31 DIAGNOSIS — N2581 Secondary hyperparathyroidism of renal origin: Secondary | ICD-10-CM | POA: Diagnosis not present

## 2017-02-02 DIAGNOSIS — N186 End stage renal disease: Secondary | ICD-10-CM | POA: Diagnosis not present

## 2017-02-02 DIAGNOSIS — D631 Anemia in chronic kidney disease: Secondary | ICD-10-CM | POA: Diagnosis not present

## 2017-02-02 DIAGNOSIS — E1129 Type 2 diabetes mellitus with other diabetic kidney complication: Secondary | ICD-10-CM | POA: Diagnosis not present

## 2017-02-02 DIAGNOSIS — N2581 Secondary hyperparathyroidism of renal origin: Secondary | ICD-10-CM | POA: Diagnosis not present

## 2017-02-05 DIAGNOSIS — N186 End stage renal disease: Secondary | ICD-10-CM | POA: Diagnosis not present

## 2017-02-05 DIAGNOSIS — E1129 Type 2 diabetes mellitus with other diabetic kidney complication: Secondary | ICD-10-CM | POA: Diagnosis not present

## 2017-02-05 DIAGNOSIS — D631 Anemia in chronic kidney disease: Secondary | ICD-10-CM | POA: Diagnosis not present

## 2017-02-05 DIAGNOSIS — N2581 Secondary hyperparathyroidism of renal origin: Secondary | ICD-10-CM | POA: Diagnosis not present

## 2017-02-07 DIAGNOSIS — E1129 Type 2 diabetes mellitus with other diabetic kidney complication: Secondary | ICD-10-CM | POA: Diagnosis not present

## 2017-02-07 DIAGNOSIS — D631 Anemia in chronic kidney disease: Secondary | ICD-10-CM | POA: Diagnosis not present

## 2017-02-07 DIAGNOSIS — N186 End stage renal disease: Secondary | ICD-10-CM | POA: Diagnosis not present

## 2017-02-07 DIAGNOSIS — N2581 Secondary hyperparathyroidism of renal origin: Secondary | ICD-10-CM | POA: Diagnosis not present

## 2017-02-08 DIAGNOSIS — T8131XS Disruption of external operation (surgical) wound, not elsewhere classified, sequela: Secondary | ICD-10-CM | POA: Diagnosis not present

## 2017-02-09 DIAGNOSIS — N2581 Secondary hyperparathyroidism of renal origin: Secondary | ICD-10-CM | POA: Diagnosis not present

## 2017-02-09 DIAGNOSIS — E1129 Type 2 diabetes mellitus with other diabetic kidney complication: Secondary | ICD-10-CM | POA: Diagnosis not present

## 2017-02-09 DIAGNOSIS — N186 End stage renal disease: Secondary | ICD-10-CM | POA: Diagnosis not present

## 2017-02-09 DIAGNOSIS — T8131XS Disruption of external operation (surgical) wound, not elsewhere classified, sequela: Secondary | ICD-10-CM | POA: Diagnosis not present

## 2017-02-09 DIAGNOSIS — D631 Anemia in chronic kidney disease: Secondary | ICD-10-CM | POA: Diagnosis not present

## 2017-02-12 DIAGNOSIS — N186 End stage renal disease: Secondary | ICD-10-CM | POA: Diagnosis not present

## 2017-02-12 DIAGNOSIS — D631 Anemia in chronic kidney disease: Secondary | ICD-10-CM | POA: Diagnosis not present

## 2017-02-12 DIAGNOSIS — N2581 Secondary hyperparathyroidism of renal origin: Secondary | ICD-10-CM | POA: Diagnosis not present

## 2017-02-12 DIAGNOSIS — E1129 Type 2 diabetes mellitus with other diabetic kidney complication: Secondary | ICD-10-CM | POA: Diagnosis not present

## 2017-02-14 DIAGNOSIS — N186 End stage renal disease: Secondary | ICD-10-CM | POA: Diagnosis not present

## 2017-02-14 DIAGNOSIS — N2581 Secondary hyperparathyroidism of renal origin: Secondary | ICD-10-CM | POA: Diagnosis not present

## 2017-02-14 DIAGNOSIS — D631 Anemia in chronic kidney disease: Secondary | ICD-10-CM | POA: Diagnosis not present

## 2017-02-14 DIAGNOSIS — E1129 Type 2 diabetes mellitus with other diabetic kidney complication: Secondary | ICD-10-CM | POA: Diagnosis not present

## 2017-02-15 DIAGNOSIS — T8131XS Disruption of external operation (surgical) wound, not elsewhere classified, sequela: Secondary | ICD-10-CM | POA: Diagnosis not present

## 2017-02-16 DIAGNOSIS — N2581 Secondary hyperparathyroidism of renal origin: Secondary | ICD-10-CM | POA: Diagnosis not present

## 2017-02-16 DIAGNOSIS — D631 Anemia in chronic kidney disease: Secondary | ICD-10-CM | POA: Diagnosis not present

## 2017-02-16 DIAGNOSIS — E1129 Type 2 diabetes mellitus with other diabetic kidney complication: Secondary | ICD-10-CM | POA: Diagnosis not present

## 2017-02-16 DIAGNOSIS — N186 End stage renal disease: Secondary | ICD-10-CM | POA: Diagnosis not present

## 2017-02-16 DIAGNOSIS — T8131XS Disruption of external operation (surgical) wound, not elsewhere classified, sequela: Secondary | ICD-10-CM | POA: Diagnosis not present

## 2017-02-17 DIAGNOSIS — T8131XS Disruption of external operation (surgical) wound, not elsewhere classified, sequela: Secondary | ICD-10-CM | POA: Diagnosis not present

## 2017-02-19 DIAGNOSIS — N2581 Secondary hyperparathyroidism of renal origin: Secondary | ICD-10-CM | POA: Diagnosis not present

## 2017-02-19 DIAGNOSIS — D631 Anemia in chronic kidney disease: Secondary | ICD-10-CM | POA: Diagnosis not present

## 2017-02-19 DIAGNOSIS — T8131XS Disruption of external operation (surgical) wound, not elsewhere classified, sequela: Secondary | ICD-10-CM | POA: Diagnosis not present

## 2017-02-19 DIAGNOSIS — N186 End stage renal disease: Secondary | ICD-10-CM | POA: Diagnosis not present

## 2017-02-19 DIAGNOSIS — E1129 Type 2 diabetes mellitus with other diabetic kidney complication: Secondary | ICD-10-CM | POA: Diagnosis not present

## 2017-02-20 DIAGNOSIS — T8131XS Disruption of external operation (surgical) wound, not elsewhere classified, sequela: Secondary | ICD-10-CM | POA: Diagnosis not present

## 2017-02-21 DIAGNOSIS — T8131XS Disruption of external operation (surgical) wound, not elsewhere classified, sequela: Secondary | ICD-10-CM | POA: Diagnosis not present

## 2017-02-21 DIAGNOSIS — N2581 Secondary hyperparathyroidism of renal origin: Secondary | ICD-10-CM | POA: Diagnosis not present

## 2017-02-21 DIAGNOSIS — Z992 Dependence on renal dialysis: Secondary | ICD-10-CM | POA: Diagnosis not present

## 2017-02-21 DIAGNOSIS — E1129 Type 2 diabetes mellitus with other diabetic kidney complication: Secondary | ICD-10-CM | POA: Diagnosis not present

## 2017-02-21 DIAGNOSIS — D631 Anemia in chronic kidney disease: Secondary | ICD-10-CM | POA: Diagnosis not present

## 2017-02-21 DIAGNOSIS — N186 End stage renal disease: Secondary | ICD-10-CM | POA: Diagnosis not present

## 2017-02-22 DIAGNOSIS — T8131XS Disruption of external operation (surgical) wound, not elsewhere classified, sequela: Secondary | ICD-10-CM | POA: Diagnosis not present

## 2017-02-23 DIAGNOSIS — R4189 Other symptoms and signs involving cognitive functions and awareness: Secondary | ICD-10-CM | POA: Diagnosis not present

## 2017-02-23 DIAGNOSIS — N2581 Secondary hyperparathyroidism of renal origin: Secondary | ICD-10-CM | POA: Diagnosis not present

## 2017-02-23 DIAGNOSIS — F209 Schizophrenia, unspecified: Secondary | ICD-10-CM | POA: Diagnosis not present

## 2017-02-23 DIAGNOSIS — N186 End stage renal disease: Secondary | ICD-10-CM | POA: Diagnosis not present

## 2017-02-23 DIAGNOSIS — D631 Anemia in chronic kidney disease: Secondary | ICD-10-CM | POA: Diagnosis not present

## 2017-02-23 DIAGNOSIS — E1129 Type 2 diabetes mellitus with other diabetic kidney complication: Secondary | ICD-10-CM | POA: Diagnosis not present

## 2017-02-23 DIAGNOSIS — F329 Major depressive disorder, single episode, unspecified: Secondary | ICD-10-CM | POA: Diagnosis not present

## 2017-02-26 DIAGNOSIS — N2581 Secondary hyperparathyroidism of renal origin: Secondary | ICD-10-CM | POA: Diagnosis not present

## 2017-02-26 DIAGNOSIS — T8131XS Disruption of external operation (surgical) wound, not elsewhere classified, sequela: Secondary | ICD-10-CM | POA: Diagnosis not present

## 2017-02-26 DIAGNOSIS — N186 End stage renal disease: Secondary | ICD-10-CM | POA: Diagnosis not present

## 2017-02-26 DIAGNOSIS — D631 Anemia in chronic kidney disease: Secondary | ICD-10-CM | POA: Diagnosis not present

## 2017-02-26 DIAGNOSIS — E1129 Type 2 diabetes mellitus with other diabetic kidney complication: Secondary | ICD-10-CM | POA: Diagnosis not present

## 2017-02-26 NOTE — Progress Notes (Signed)
Location:   [facility] Room Number: 202A Place of Service:  SNF (31)   CODE STATUS: full code   No Known Allergies  Chief Complaint  Patient presents with  . Medical Management of Chronic Issues    routine visit    HPI:  She is a [place] term resident of this facility being seen for the management of her chronic illnesses. Overall there is little change in her status. She is unable to participate in the hpi or ros. There are no nursing concerns at this time.    Past Medical History:  Diagnosis Date  . Anemia    Due to fibroids.  . Bilateral leg edema    a. Chronic.  Marland Kitchen Blind   . Cardiac arrest ([place]) 12/12/2014  . CHF (congestive heart failure) ([place])   . Coronary artery disease   . Deaf    USE SIGN INTERPRETER.  Divorced from husband but lives with him. Has daughter but she does not care for her.  . Diabetes mellitus    a. Per PCP note 2012 (A1C 9.2) - pt unwilling to take meds and was educated on risk of uncontrolled DM.  b. A1C 5.9 in 11/2013.  Marland Kitchen ESRD on hemodialysis ([place])    Mon, Wed, Fri  . Essential hypertension 08/09/2009   Qualifier: Diagnosis of  By: Sarita Haver  MD, Coralyn Mark    . Fibroid uterus   . Heart murmur   . Hyperlipidemia   . Hypertension    a. Has previously refused blood pressure meds.   . Leiomyoma of uterus 08/09/2009   Qualifier: Diagnosis of  By: Sarita Haver  MD, Coralyn Mark    . MI (myocardial infarction) 11/2014  . Moderate tricuspid regurgitation   . Osteomyelitis ([place])   . Psychiatric disorder    She frequently exhibits paranoia and has been diagnosed with psychotic d/o NOS during hospital stay in the past. She is tangetial and perseverative during her visits. She apparently has had a bad experience with mental health in [place] in the past and refuses to discuss mental issues for fear that she will be sent back there. Her paranoia, communication issues, financial woes and lack of fam  . Pulmonary hypertension     Past Surgical History:    Procedure Laterality Date  . AMPUTATION Bilateral 04/02/2015   Procedure: Amputation Bilateral Great Toes MTP Joint;  Surgeon: Newt Minion, MD;  Location: [facility];  Service: Orthopedics;  Laterality: Bilateral;  . AMPUTATION Left 05/14/2015   Procedure: Left Transmetatarsal Amputation;  Surgeon: Newt Minion, MD;  Location: [facility];  Service: Orthopedics;  Laterality: Left;  . AMPUTATION Right 08/20/2015   Procedure: Transmetatarsal Amputation Right Foot;  Surgeon: Newt Minion, MD;  Location: [facility];  Service: Orthopedics;  Laterality: Right;  . AV FISTULA PLACEMENT Left 01/02/2014   Procedure: ARTERIOVENOUS (AV) FISTULA CREATION;  Surgeon: Rosetta Posner, MD;  Location: [place];  Service: Vascular;  Laterality: Left;  . INSERTION OF DIALYSIS CATHETER Right 01/02/2014   Procedure: INSERTION OF DIALYSIS CATHETER;  Surgeon: Rosetta Posner, MD;  Location: [place];  Service: Vascular;  Laterality: Right;  . NO PAST SURGERIES    . ORIF PATELLA Left 05/17/2015   Procedure: OPEN REDUCTION INTERNAL (ORIF) FIXATION PATELLA;  Surgeon: Newt Minion, MD;  Location: [place];  Service: Orthopedics;  Laterality: Left;    Social History   Social History  . Marital status: Widowed    Spouse name: N/A  . Number of  children: N/A  . Years of education: N/A   Occupational History  . Not on file.   Social History Main Topics  . Smoking status: Never Smoker  . Smokeless tobacco: Never Used     Comment: Prior dip  . Alcohol use No  . Drug use: No  . Sexual activity: Not on file   Other Topics Concern  . Not on file   Social History Narrative  . No narrative on file   Family History  Problem Relation Age of Onset  . Other Father     Drowned from fishing?  . Heart disease        VITAL SIGNS BP 122/67   Pulse 72   Temp 97.5 F (36.4 C)   Resp 16   Ht 5\' 6"  (1.676 m)   Wt 152 lb (68.9 kg)   SpO2 99%   BMI 24.53 kg/m   Patient's Medications  New Prescriptions   No medications on file  Previous  Medications   AMIODARONE (PACERONE) 200 MG TABLET    Take 200 mg by mouth daily.    ARIPIPRAZOLE (ABILIFY) 10 MG TABLET    Take 10 mg by mouth at bedtime.    CALCIUM ACETATE (PHOSLO) 667 MG TABLET    Take 3 tablets by mouth before meals for dialysis   CLOPIDOGREL (PLAVIX) 75 MG TABLET    Take 75 mg by mouth every evening.   METOPROLOL SUCCINATE (TOPROL-XL) 50 MG 24 HR TABLET    Take 50 mg by mouth every evening. Take with or immediately following a meal.   NITROGLYCERIN (NITROSTAT) 0.4 MG SL TABLET    Place 0.4 mg under the tongue every 5 (five) minutes as needed for chest pain.   POLLEN EXTRACTS (PROSTAT PO)    Take 60 g by mouth 2 (two) times daily.   POLYETHYLENE GLYCOL (MIRALAX / GLYCOLAX) PACKET    Take 17 g by mouth daily.    TRAMADOL (ULTRAM) 50 MG TABLET    Take 50 mg by mouth daily.  Modified Medications   No medications on file  Discontinued Medications   No medications on file     SIGNIFICANT DIAGNOSTIC EXAMS   11-24-14: Myoview: 1. Fixed defects involving the apex, anterior lateral wall and the inferior wall. The apex and anterior lateral wall fixed defects aresuggestive for an infarct. No evidence for reversibility or ischemia. 2. Diffuse hypokinesia and minimal wall motion along the lateral wall. 3. Left ventricular ejection fraction is 35%. 4. High-risk stress test findings*.  04-16-15: 2-d echo: - Left ventricle: The cavity size was mildly dilated. Wall thickness was increased in a pattern of mild LVH. Systolic function was moderately to severely reduced. The estimated ejection fraction was in the range of 30% to 35%. There is akinesis of the anteroseptal and apical myocardium. Features are consistent with a pseudonormal left ventricular filling pattern, with concomitant abnormal relaxation and increased filling pressure (grade 2 diastolic dysfunction). Doppler parameters are consistent with high ventricular filling pressure. - Mitral valve: Calcified annulus. There was mild  regurgitation. - Left atrium: The atrium was mildly dilated. - Pulmonary arteries: Systolic pressure was mildly increased.     LABS REVIEWED: she will decline labs   05-17-16: wbc 6.3; hgb 11.3; hct 33.5; mcv 93.9; plt 269; glucose 66; bun 34.3; creat 4.38; k+ 4.3; na++ 139; liver normal albumin 3.9; tsh 3.17; hgb a1c 5.4; chol 232; ldl 134; trig 90; hdl 80 11-08-16: wbc 5.5; hgb 9.9; hct 31.2; mcv 99.3; plt 196;  glucose 95; bun 71,2; creat 6.55; k+ 5.0; na++ 137; liver normal albumin 4.0; hgb a1c 5.4      Review of Systems Unable to perform ROS: Other    Physical Exam Constitutional: She appears well-developed and well-nourished. No distress.  Eyes: Conjunctivae are normal.  Neck: Neck supple. No JVD present. No thyromegaly present.  Cardiovascular: Normal rate, regular rhythm and intact distal pulses.  Murmur   Respiratory: Effort normal and breath sounds normal. No respiratory distress. She has no wheezes.  GI: Soft. Bowel sounds are normal. She exhibits no distension. There is no tenderness.  Musculoskeletal: She exhibits no edema.  Able to move all extremities  Has bilateral  transmetatarsal amputation; Lymphadenopathy:    She has no cervical adenopathy.  Neurological: She is alert.  Skin: Skin is warm and dry. She is not diaphoretic.  Left upper a/v fistula: +thril +bruit    ASSESSMENT/ PLAN:  1. ESRD on hemodialysis: is followed by nephrology. Will continue dialysis three times weekly. Will continue phoslo 667 mg 3 tabs  three times daily will monitor   2. Hypertension: is presently stable will continue toprol xl 50 mg daily   3. PAD: is without change is status post left and right transmetatarsal amputation: will continue plavix 75 mg daily  Will continue ultram 50 mg daily for pain management   4. 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  Sustained V tach: not a candidate for ICD. Is on amiodarone 200 mg daily for heart rate control will monitor   6. FTT: her current weight is 151 pounds; her albumin is 4.0. Will continue supplements per facility protocol.   7. Psychosis; paranoid : will continue abilify 10 mg daily . Will monitor   8. Constipation: will continue miralax daily   9. Diabetes is presently not on medications; will not make changes will monitor her status.    MD is aware of resident's narcotic use and is in agreement with current plan of care. We will attempt to wean resident as apropriate   Ok Edwards NP [facility]  Contact [phone] Monday through Friday 8am- 5pm  After hours call [phone]

## 2017-02-27 DIAGNOSIS — T8131XS Disruption of external operation (surgical) wound, not elsewhere classified, sequela: Secondary | ICD-10-CM | POA: Diagnosis not present

## 2017-02-28 DIAGNOSIS — T8131XS Disruption of external operation (surgical) wound, not elsewhere classified, sequela: Secondary | ICD-10-CM | POA: Diagnosis not present

## 2017-02-28 DIAGNOSIS — E1129 Type 2 diabetes mellitus with other diabetic kidney complication: Secondary | ICD-10-CM | POA: Diagnosis not present

## 2017-02-28 DIAGNOSIS — D631 Anemia in chronic kidney disease: Secondary | ICD-10-CM | POA: Diagnosis not present

## 2017-02-28 DIAGNOSIS — N186 End stage renal disease: Secondary | ICD-10-CM | POA: Diagnosis not present

## 2017-02-28 DIAGNOSIS — N2581 Secondary hyperparathyroidism of renal origin: Secondary | ICD-10-CM | POA: Diagnosis not present

## 2017-03-01 DIAGNOSIS — T8131XS Disruption of external operation (surgical) wound, not elsewhere classified, sequela: Secondary | ICD-10-CM | POA: Diagnosis not present

## 2017-03-02 DIAGNOSIS — T8131XS Disruption of external operation (surgical) wound, not elsewhere classified, sequela: Secondary | ICD-10-CM | POA: Diagnosis not present

## 2017-03-02 DIAGNOSIS — D631 Anemia in chronic kidney disease: Secondary | ICD-10-CM | POA: Diagnosis not present

## 2017-03-02 DIAGNOSIS — E1129 Type 2 diabetes mellitus with other diabetic kidney complication: Secondary | ICD-10-CM | POA: Diagnosis not present

## 2017-03-02 DIAGNOSIS — N186 End stage renal disease: Secondary | ICD-10-CM | POA: Diagnosis not present

## 2017-03-02 DIAGNOSIS — N2581 Secondary hyperparathyroidism of renal origin: Secondary | ICD-10-CM | POA: Diagnosis not present

## 2017-03-05 DIAGNOSIS — N2581 Secondary hyperparathyroidism of renal origin: Secondary | ICD-10-CM | POA: Diagnosis not present

## 2017-03-05 DIAGNOSIS — D631 Anemia in chronic kidney disease: Secondary | ICD-10-CM | POA: Diagnosis not present

## 2017-03-05 DIAGNOSIS — E1129 Type 2 diabetes mellitus with other diabetic kidney complication: Secondary | ICD-10-CM | POA: Diagnosis not present

## 2017-03-05 DIAGNOSIS — N186 End stage renal disease: Secondary | ICD-10-CM | POA: Diagnosis not present

## 2017-03-06 ENCOUNTER — Encounter: Payer: Self-pay | Admitting: Adult Health

## 2017-03-06 ENCOUNTER — Non-Acute Institutional Stay (SKILLED_NURSING_FACILITY): Payer: Medicare Other | Admitting: Adult Health

## 2017-03-06 DIAGNOSIS — F22 Delusional disorders: Secondary | ICD-10-CM | POA: Diagnosis not present

## 2017-03-06 DIAGNOSIS — I472 Ventricular tachycardia, unspecified: Secondary | ICD-10-CM

## 2017-03-06 DIAGNOSIS — K5901 Slow transit constipation: Secondary | ICD-10-CM | POA: Diagnosis not present

## 2017-03-06 DIAGNOSIS — I5042 Chronic combined systolic (congestive) and diastolic (congestive) heart failure: Secondary | ICD-10-CM

## 2017-03-06 DIAGNOSIS — H547 Unspecified visual loss: Secondary | ICD-10-CM | POA: Diagnosis not present

## 2017-03-06 DIAGNOSIS — H9190 Unspecified hearing loss, unspecified ear: Secondary | ICD-10-CM

## 2017-03-06 DIAGNOSIS — Z992 Dependence on renal dialysis: Secondary | ICD-10-CM

## 2017-03-06 DIAGNOSIS — I110 Hypertensive heart disease with heart failure: Secondary | ICD-10-CM | POA: Diagnosis not present

## 2017-03-06 DIAGNOSIS — D631 Anemia in chronic kidney disease: Secondary | ICD-10-CM

## 2017-03-06 DIAGNOSIS — I739 Peripheral vascular disease, unspecified: Secondary | ICD-10-CM | POA: Diagnosis not present

## 2017-03-06 DIAGNOSIS — R627 Adult failure to thrive: Secondary | ICD-10-CM

## 2017-03-06 DIAGNOSIS — E1122 Type 2 diabetes mellitus with diabetic chronic kidney disease: Secondary | ICD-10-CM

## 2017-03-06 DIAGNOSIS — Z91148 Patient's other noncompliance with medication regimen for other reason: Secondary | ICD-10-CM

## 2017-03-06 DIAGNOSIS — Z9114 Patient's other noncompliance with medication regimen: Secondary | ICD-10-CM | POA: Diagnosis not present

## 2017-03-06 DIAGNOSIS — N186 End stage renal disease: Secondary | ICD-10-CM | POA: Diagnosis not present

## 2017-03-06 NOTE — Progress Notes (Signed)
Location:   [facility] Room Number: 202 A Place of Service:  SNF (31)   CODE STATUS: Full Code  No Known Allergies  Chief Complaint  Patient presents with  . Medical Management of Chronic Issues    Routine Visit    HPI:  She is a [place] term resident of this facility being seen for the management of her chronic illnesses. Overall her status is without significant change. She continues with dialysis three days per week. There are no nursing concerns at this time.    Past Medical History:  Diagnosis Date  . Anemia    Due to fibroids.  . Bilateral leg edema    a. Chronic.  Marland Kitchen Blind   . Cardiac arrest ([place]) 12/12/2014  . CHF (congestive heart failure) ([place])   . Coronary artery disease   . Deaf    USE SIGN INTERPRETER.  Divorced from husband but lives with him. Has daughter but she does not care for her.  . Diabetes mellitus    a. Per PCP note 2012 (A1C 9.2) - pt unwilling to take meds and was educated on risk of uncontrolled DM.  b. A1C 5.9 in 11/2013.  Marland Kitchen ESRD on hemodialysis ([place])    Mon, Wed, Fri  . Essential hypertension 08/09/2009   Qualifier: Diagnosis of  By: Sarita Haver  MD, Coralyn Mark    . Fibroid uterus   . Heart murmur   . Hyperlipidemia   . Hypertension    a. Has previously refused blood pressure meds.   . Leiomyoma of uterus 08/09/2009   Qualifier: Diagnosis of  By: Sarita Haver  MD, Coralyn Mark    . MI (myocardial infarction) 11/2014  . Moderate tricuspid regurgitation   . Osteomyelitis ([place])   . Psychiatric disorder    She frequently exhibits paranoia and has been diagnosed with psychotic d/o NOS during hospital stay in the past. She is tangetial and perseverative during her visits. She apparently has had a bad experience with mental health in [place] in the past and refuses to discuss mental issues for fear that she will be sent back there. Her paranoia, communication issues, financial woes and lack of fam  . Pulmonary hypertension     Past Surgical History:    Procedure Laterality Date  . AMPUTATION Bilateral 04/02/2015   Procedure: Amputation Bilateral Great Toes MTP Joint;  Surgeon: Newt Minion, MD;  Location: [facility];  Service: Orthopedics;  Laterality: Bilateral;  . AMPUTATION Left 05/14/2015   Procedure: Left Transmetatarsal Amputation;  Surgeon: Newt Minion, MD;  Location: [facility];  Service: Orthopedics;  Laterality: Left;  . AMPUTATION Right 08/20/2015   Procedure: Transmetatarsal Amputation Right Foot;  Surgeon: Newt Minion, MD;  Location: [facility];  Service: Orthopedics;  Laterality: Right;  . AV FISTULA PLACEMENT Left 01/02/2014   Procedure: ARTERIOVENOUS (AV) FISTULA CREATION;  Surgeon: Rosetta Posner, MD;  Location: [facility];  Service: Vascular;  Laterality: Left;  . INSERTION OF DIALYSIS CATHETER Right 01/02/2014   Procedure: INSERTION OF DIALYSIS CATHETER;  Surgeon: Rosetta Posner, MD;  Location: [place] [facility];  Service: Vascular;  Laterality: Right;  . NO PAST SURGERIES    . ORIF PATELLA Left 05/17/2015   Procedure: OPEN REDUCTION INTERNAL (ORIF) FIXATION PATELLA;  Surgeon: Newt Minion, MD;  Location: [facility];  Service: Orthopedics;  Laterality: Left;    Social History   Social History  . Marital status: Widowed    Spouse name: N/A  . Number of children: N/A  . Years  of education: N/A   Occupational History  . Not on file.   Social History Main Topics  . Smoking status: Never Smoker  . Smokeless tobacco: Never Used     Comment: Prior dip  . Alcohol use No  . Drug use: No  . Sexual activity: Not on file   Other Topics Concern  . Not on file   Social History Narrative  . No narrative on file   Family History  Problem Relation Age of Onset  . Other Father     Drowned from fishing?  . Heart disease        VITAL SIGNS BP 122/68   Pulse 74   Temp 98.2 F (36.8 C)   Resp 19   Ht 5\' 6"  (1.676 m)   Wt 152 lb (68.9 kg)   SpO2 98%   BMI 24.53 kg/m   Patient's Medications  New Prescriptions   No medications on file  Previous  Medications   AMINO ACIDS-PROTEIN HYDROLYS (FEEDING SUPPLEMENT, PRO-STAT SUGAR FREE 64,) LIQD    Take 60 mLs by mouth 2 (two) times daily.   AMIODARONE (PACERONE) 200 MG TABLET    Take 200 mg by mouth daily.    ARIPIPRAZOLE (ABILIFY) 10 MG TABLET    Take 10 mg by mouth at bedtime.    CALCIUM ACETATE (PHOSLO) 667 MG TABLET    Take 3 tablets by mouth before meals for dialysis   CLOPIDOGREL (PLAVIX) 75 MG TABLET    Take 75 mg by mouth every evening.   METOPROLOL SUCCINATE (TOPROL-XL) 50 MG 24 HR TABLET    Take 50 mg by mouth every evening. Take with or immediately following a meal.   NITROGLYCERIN (NITROSTAT) 0.4 MG SL TABLET    Place 0.4 mg under the tongue every 5 (five) minutes as needed for chest pain.   POLYETHYLENE GLYCOL (MIRALAX / GLYCOLAX) PACKET    Take 17 g by mouth daily.    SKIN PROTECTANTS, MISC. (CALAZIME SKIN PROTECTANT EX)    Apply to buttocks 2 times daily   TRAMADOL (ULTRAM) 50 MG TABLET    Take 50 mg by mouth daily.  Modified Medications   No medications on file  Discontinued Medications   POLLEN EXTRACTS (PROSTAT PO)    Take 60 g by mouth 2 (two) times daily.     SIGNIFICANT DIAGNOSTIC EXAMS  11-24-14: Myoview: 1. Fixed defects involving the apex, anterior lateral wall and the inferior wall. The apex and anterior lateral wall fixed defects aresuggestive for an infarct. No evidence for reversibility or ischemia. 2. Diffuse hypokinesia and minimal wall motion along the lateral wall. 3. Left ventricular ejection fraction is 35%. 4. High-risk stress test findings*.  04-16-15: 2-d echo: - Left ventricle: The cavity size was mildly dilated. Wall thickness was increased in a pattern of mild LVH. Systolic function was moderately to severely reduced. The estimated ejection fraction was in the range of 30% to 35%. There is akinesis of the anteroseptal and apical myocardium. Features are consistent with a pseudonormal left ventricular filling pattern, with concomitant abnormal  relaxation and increased filling pressure (grade 2 diastolic dysfunction). Doppler parameters are consistent with high ventricular filling pressure. - Mitral valve: Calcified annulus. There was mild regurgitation. - Left atrium: The atrium was mildly dilated. - Pulmonary arteries: Systolic pressure was mildly increased.     LABS REVIEWED: she will decline labs   05-17-16: wbc 6.3; hgb 11.3; hct 33.5; mcv 93.9; plt 269; glucose 66; bun 34.3; creat 4.38; k+ 4.3;  na++ 139; liver normal albumin 3.9; tsh 3.17; hgb a1c 5.4; chol 232; ldl 134; trig 90; hdl 80 11-08-16: wbc 5.5; hgb 9.9; hct 31.2; mcv 99.3; plt 196; glucose 95; bun 71,2; creat 6.55; k+ 5.0; na++ 137; liver normal albumin 4.0; hgb a1c 5.4      Review of Systems Unable to perform ROS: Other    Physical Exam Constitutional: She appears well-developed and well-nourished. No distress.  Eyes: Conjunctivae are normal.  Neck: Neck supple. No JVD present. No thyromegaly present.  Cardiovascular: Normal rate, regular rhythm and intact distal pulses.  Murmur   Respiratory: Effort normal and breath sounds normal. No respiratory distress. She has no wheezes.  GI: Soft. Bowel sounds are normal. She exhibits no distension. There is no tenderness.  Musculoskeletal: She exhibits no edema.  Able to move all extremities  Has bilateral  transmetatarsal amputation; Lymphadenopathy:    She has no cervical adenopathy.  Neurological: She is alert.  Skin: Skin is warm and dry. She is not diaphoretic.  Left upper a/v fistula: +thril +bruit    ASSESSMENT/ PLAN:  1. ESRD on hemodialysis: is followed by nephrology. Will continue dialysis three times weekly. Will continue phoslo 667 mg 3 tabs  three times daily will monitor   2. Hypertension: is presently stable will continue toprol xl 50 mg daily   3. PAD: is without change is status post left and right transmetatarsal amputation: will continue plavix 75 mg daily  Will continue ultram 50 mg  daily for pain management   4. 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 Sustained V tach: not a candidate for ICD. Is on amiodarone 200 mg daily for heart rate control will monitor   6. FTT: her current weight is 152 pounds; her albumin is 4.0. Will continue supplements per facility protocol.   7. Psychosis; paranoid : will continue abilify 10 mg daily . Will monitor   She has gone through periods of time; when she will not take medications.   8. Constipation: will continue miralax daily    9. Diabetes: hgb a1c 5.4  is presently not on medications; will not make changes will monitor her status.   10. Dyslipidemia: is presently not on medications;   11. Anemia: hgb 9.9; will monitor   MD is aware of resident's narcotic use and is in agreement with current plan of care. We will attempt to wean resident as apropriate     Ok Edwards NP [facility]  Contact [phone] Monday through Friday 8am- 5pm  After hours call [phone]

## 2017-03-07 DIAGNOSIS — T8131XS Disruption of external operation (surgical) wound, not elsewhere classified, sequela: Secondary | ICD-10-CM | POA: Diagnosis not present

## 2017-03-07 DIAGNOSIS — N2581 Secondary hyperparathyroidism of renal origin: Secondary | ICD-10-CM | POA: Diagnosis not present

## 2017-03-07 DIAGNOSIS — N186 End stage renal disease: Secondary | ICD-10-CM | POA: Diagnosis not present

## 2017-03-07 DIAGNOSIS — D631 Anemia in chronic kidney disease: Secondary | ICD-10-CM | POA: Diagnosis not present

## 2017-03-07 DIAGNOSIS — E1129 Type 2 diabetes mellitus with other diabetic kidney complication: Secondary | ICD-10-CM | POA: Diagnosis not present

## 2017-03-08 DIAGNOSIS — T8131XS Disruption of external operation (surgical) wound, not elsewhere classified, sequela: Secondary | ICD-10-CM | POA: Diagnosis not present

## 2017-03-09 DIAGNOSIS — N2581 Secondary hyperparathyroidism of renal origin: Secondary | ICD-10-CM | POA: Diagnosis not present

## 2017-03-09 DIAGNOSIS — E1129 Type 2 diabetes mellitus with other diabetic kidney complication: Secondary | ICD-10-CM | POA: Diagnosis not present

## 2017-03-09 DIAGNOSIS — N186 End stage renal disease: Secondary | ICD-10-CM | POA: Diagnosis not present

## 2017-03-09 DIAGNOSIS — D631 Anemia in chronic kidney disease: Secondary | ICD-10-CM | POA: Diagnosis not present

## 2017-03-12 DIAGNOSIS — T8131XS Disruption of external operation (surgical) wound, not elsewhere classified, sequela: Secondary | ICD-10-CM | POA: Diagnosis not present

## 2017-03-12 DIAGNOSIS — N2581 Secondary hyperparathyroidism of renal origin: Secondary | ICD-10-CM | POA: Diagnosis not present

## 2017-03-12 DIAGNOSIS — E1129 Type 2 diabetes mellitus with other diabetic kidney complication: Secondary | ICD-10-CM | POA: Diagnosis not present

## 2017-03-12 DIAGNOSIS — N186 End stage renal disease: Secondary | ICD-10-CM | POA: Diagnosis not present

## 2017-03-12 DIAGNOSIS — D631 Anemia in chronic kidney disease: Secondary | ICD-10-CM | POA: Diagnosis not present

## 2017-03-13 DIAGNOSIS — T8131XS Disruption of external operation (surgical) wound, not elsewhere classified, sequela: Secondary | ICD-10-CM | POA: Diagnosis not present

## 2017-03-14 DIAGNOSIS — N2581 Secondary hyperparathyroidism of renal origin: Secondary | ICD-10-CM | POA: Diagnosis not present

## 2017-03-14 DIAGNOSIS — E1129 Type 2 diabetes mellitus with other diabetic kidney complication: Secondary | ICD-10-CM | POA: Diagnosis not present

## 2017-03-14 DIAGNOSIS — D631 Anemia in chronic kidney disease: Secondary | ICD-10-CM | POA: Diagnosis not present

## 2017-03-14 DIAGNOSIS — N186 End stage renal disease: Secondary | ICD-10-CM | POA: Diagnosis not present

## 2017-03-14 DIAGNOSIS — T8131XS Disruption of external operation (surgical) wound, not elsewhere classified, sequela: Secondary | ICD-10-CM | POA: Diagnosis not present

## 2017-03-15 DIAGNOSIS — T8131XS Disruption of external operation (surgical) wound, not elsewhere classified, sequela: Secondary | ICD-10-CM | POA: Diagnosis not present

## 2017-03-16 DIAGNOSIS — T8131XS Disruption of external operation (surgical) wound, not elsewhere classified, sequela: Secondary | ICD-10-CM | POA: Diagnosis not present

## 2017-03-16 DIAGNOSIS — N186 End stage renal disease: Secondary | ICD-10-CM | POA: Diagnosis not present

## 2017-03-16 DIAGNOSIS — N2581 Secondary hyperparathyroidism of renal origin: Secondary | ICD-10-CM | POA: Diagnosis not present

## 2017-03-16 DIAGNOSIS — E1129 Type 2 diabetes mellitus with other diabetic kidney complication: Secondary | ICD-10-CM | POA: Diagnosis not present

## 2017-03-16 DIAGNOSIS — D631 Anemia in chronic kidney disease: Secondary | ICD-10-CM | POA: Diagnosis not present

## 2017-03-19 DIAGNOSIS — E1129 Type 2 diabetes mellitus with other diabetic kidney complication: Secondary | ICD-10-CM | POA: Diagnosis not present

## 2017-03-19 DIAGNOSIS — N186 End stage renal disease: Secondary | ICD-10-CM | POA: Diagnosis not present

## 2017-03-19 DIAGNOSIS — N2581 Secondary hyperparathyroidism of renal origin: Secondary | ICD-10-CM | POA: Diagnosis not present

## 2017-03-19 DIAGNOSIS — D631 Anemia in chronic kidney disease: Secondary | ICD-10-CM | POA: Diagnosis not present

## 2017-03-19 DIAGNOSIS — T8131XS Disruption of external operation (surgical) wound, not elsewhere classified, sequela: Secondary | ICD-10-CM | POA: Diagnosis not present

## 2017-03-20 DIAGNOSIS — T8131XS Disruption of external operation (surgical) wound, not elsewhere classified, sequela: Secondary | ICD-10-CM | POA: Diagnosis not present

## 2017-03-21 DIAGNOSIS — N2581 Secondary hyperparathyroidism of renal origin: Secondary | ICD-10-CM | POA: Diagnosis not present

## 2017-03-21 DIAGNOSIS — T8131XS Disruption of external operation (surgical) wound, not elsewhere classified, sequela: Secondary | ICD-10-CM | POA: Diagnosis not present

## 2017-03-21 DIAGNOSIS — D631 Anemia in chronic kidney disease: Secondary | ICD-10-CM | POA: Diagnosis not present

## 2017-03-21 DIAGNOSIS — N186 End stage renal disease: Secondary | ICD-10-CM | POA: Diagnosis not present

## 2017-03-21 DIAGNOSIS — E1129 Type 2 diabetes mellitus with other diabetic kidney complication: Secondary | ICD-10-CM | POA: Diagnosis not present

## 2017-03-22 DIAGNOSIS — T8131XS Disruption of external operation (surgical) wound, not elsewhere classified, sequela: Secondary | ICD-10-CM | POA: Diagnosis not present

## 2017-03-23 DIAGNOSIS — N2581 Secondary hyperparathyroidism of renal origin: Secondary | ICD-10-CM | POA: Diagnosis not present

## 2017-03-23 DIAGNOSIS — D631 Anemia in chronic kidney disease: Secondary | ICD-10-CM | POA: Diagnosis not present

## 2017-03-23 DIAGNOSIS — N186 End stage renal disease: Secondary | ICD-10-CM | POA: Diagnosis not present

## 2017-03-23 DIAGNOSIS — T8131XS Disruption of external operation (surgical) wound, not elsewhere classified, sequela: Secondary | ICD-10-CM | POA: Diagnosis not present

## 2017-03-23 DIAGNOSIS — E1129 Type 2 diabetes mellitus with other diabetic kidney complication: Secondary | ICD-10-CM | POA: Diagnosis not present

## 2017-03-24 DIAGNOSIS — E1129 Type 2 diabetes mellitus with other diabetic kidney complication: Secondary | ICD-10-CM | POA: Diagnosis not present

## 2017-03-24 DIAGNOSIS — N186 End stage renal disease: Secondary | ICD-10-CM | POA: Diagnosis not present

## 2017-03-24 DIAGNOSIS — Z992 Dependence on renal dialysis: Secondary | ICD-10-CM | POA: Diagnosis not present

## 2017-03-26 DIAGNOSIS — T8131XS Disruption of external operation (surgical) wound, not elsewhere classified, sequela: Secondary | ICD-10-CM | POA: Diagnosis not present

## 2017-03-26 DIAGNOSIS — N2581 Secondary hyperparathyroidism of renal origin: Secondary | ICD-10-CM | POA: Diagnosis not present

## 2017-03-26 DIAGNOSIS — N186 End stage renal disease: Secondary | ICD-10-CM | POA: Diagnosis not present

## 2017-03-26 DIAGNOSIS — D631 Anemia in chronic kidney disease: Secondary | ICD-10-CM | POA: Diagnosis not present

## 2017-03-26 DIAGNOSIS — E1129 Type 2 diabetes mellitus with other diabetic kidney complication: Secondary | ICD-10-CM | POA: Diagnosis not present

## 2017-03-27 DIAGNOSIS — T8131XS Disruption of external operation (surgical) wound, not elsewhere classified, sequela: Secondary | ICD-10-CM | POA: Diagnosis not present

## 2017-03-28 DIAGNOSIS — E1129 Type 2 diabetes mellitus with other diabetic kidney complication: Secondary | ICD-10-CM | POA: Diagnosis not present

## 2017-03-28 DIAGNOSIS — N186 End stage renal disease: Secondary | ICD-10-CM | POA: Diagnosis not present

## 2017-03-28 DIAGNOSIS — T8131XS Disruption of external operation (surgical) wound, not elsewhere classified, sequela: Secondary | ICD-10-CM | POA: Diagnosis not present

## 2017-03-28 DIAGNOSIS — N2581 Secondary hyperparathyroidism of renal origin: Secondary | ICD-10-CM | POA: Diagnosis not present

## 2017-03-28 DIAGNOSIS — D631 Anemia in chronic kidney disease: Secondary | ICD-10-CM | POA: Diagnosis not present

## 2017-03-29 DIAGNOSIS — T8131XS Disruption of external operation (surgical) wound, not elsewhere classified, sequela: Secondary | ICD-10-CM | POA: Diagnosis not present

## 2017-03-30 DIAGNOSIS — E1129 Type 2 diabetes mellitus with other diabetic kidney complication: Secondary | ICD-10-CM | POA: Diagnosis not present

## 2017-03-30 DIAGNOSIS — N2581 Secondary hyperparathyroidism of renal origin: Secondary | ICD-10-CM | POA: Diagnosis not present

## 2017-03-30 DIAGNOSIS — T8131XS Disruption of external operation (surgical) wound, not elsewhere classified, sequela: Secondary | ICD-10-CM | POA: Diagnosis not present

## 2017-03-30 DIAGNOSIS — D631 Anemia in chronic kidney disease: Secondary | ICD-10-CM | POA: Diagnosis not present

## 2017-03-30 DIAGNOSIS — N186 End stage renal disease: Secondary | ICD-10-CM | POA: Diagnosis not present

## 2017-04-02 DIAGNOSIS — E1129 Type 2 diabetes mellitus with other diabetic kidney complication: Secondary | ICD-10-CM | POA: Diagnosis not present

## 2017-04-02 DIAGNOSIS — T8131XS Disruption of external operation (surgical) wound, not elsewhere classified, sequela: Secondary | ICD-10-CM | POA: Diagnosis not present

## 2017-04-02 DIAGNOSIS — N2581 Secondary hyperparathyroidism of renal origin: Secondary | ICD-10-CM | POA: Diagnosis not present

## 2017-04-02 DIAGNOSIS — N186 End stage renal disease: Secondary | ICD-10-CM | POA: Diagnosis not present

## 2017-04-02 DIAGNOSIS — D631 Anemia in chronic kidney disease: Secondary | ICD-10-CM | POA: Diagnosis not present

## 2017-04-03 DIAGNOSIS — T8131XS Disruption of external operation (surgical) wound, not elsewhere classified, sequela: Secondary | ICD-10-CM | POA: Diagnosis not present

## 2017-04-04 DIAGNOSIS — N186 End stage renal disease: Secondary | ICD-10-CM | POA: Diagnosis not present

## 2017-04-04 DIAGNOSIS — D631 Anemia in chronic kidney disease: Secondary | ICD-10-CM | POA: Diagnosis not present

## 2017-04-04 DIAGNOSIS — E1129 Type 2 diabetes mellitus with other diabetic kidney complication: Secondary | ICD-10-CM | POA: Diagnosis not present

## 2017-04-04 DIAGNOSIS — N2581 Secondary hyperparathyroidism of renal origin: Secondary | ICD-10-CM | POA: Diagnosis not present

## 2017-04-04 DIAGNOSIS — T8131XS Disruption of external operation (surgical) wound, not elsewhere classified, sequela: Secondary | ICD-10-CM | POA: Diagnosis not present

## 2017-04-05 DIAGNOSIS — T8131XS Disruption of external operation (surgical) wound, not elsewhere classified, sequela: Secondary | ICD-10-CM | POA: Diagnosis not present

## 2017-04-06 ENCOUNTER — Encounter: Payer: Self-pay | Admitting: Adult Health

## 2017-04-06 ENCOUNTER — Non-Acute Institutional Stay (SKILLED_NURSING_FACILITY): Payer: Medicare Other | Admitting: Adult Health

## 2017-04-06 DIAGNOSIS — E1169 Type 2 diabetes mellitus with other specified complication: Secondary | ICD-10-CM | POA: Diagnosis not present

## 2017-04-06 DIAGNOSIS — H547 Unspecified visual loss: Secondary | ICD-10-CM | POA: Diagnosis not present

## 2017-04-06 DIAGNOSIS — Z9114 Patient's other noncompliance with medication regimen: Secondary | ICD-10-CM | POA: Diagnosis not present

## 2017-04-06 DIAGNOSIS — E039 Hypothyroidism, unspecified: Secondary | ICD-10-CM | POA: Diagnosis not present

## 2017-04-06 DIAGNOSIS — I110 Hypertensive heart disease with heart failure: Secondary | ICD-10-CM

## 2017-04-06 DIAGNOSIS — I472 Ventricular tachycardia, unspecified: Secondary | ICD-10-CM

## 2017-04-06 DIAGNOSIS — N186 End stage renal disease: Secondary | ICD-10-CM

## 2017-04-06 DIAGNOSIS — F22 Delusional disorders: Secondary | ICD-10-CM | POA: Diagnosis not present

## 2017-04-06 DIAGNOSIS — E1122 Type 2 diabetes mellitus with diabetic chronic kidney disease: Secondary | ICD-10-CM | POA: Diagnosis not present

## 2017-04-06 DIAGNOSIS — H9190 Unspecified hearing loss, unspecified ear: Secondary | ICD-10-CM | POA: Diagnosis not present

## 2017-04-06 DIAGNOSIS — Z992 Dependence on renal dialysis: Secondary | ICD-10-CM

## 2017-04-06 DIAGNOSIS — T8131XS Disruption of external operation (surgical) wound, not elsewhere classified, sequela: Secondary | ICD-10-CM | POA: Diagnosis not present

## 2017-04-06 DIAGNOSIS — I5042 Chronic combined systolic (congestive) and diastolic (congestive) heart failure: Secondary | ICD-10-CM | POA: Diagnosis not present

## 2017-04-06 DIAGNOSIS — E1129 Type 2 diabetes mellitus with other diabetic kidney complication: Secondary | ICD-10-CM | POA: Diagnosis not present

## 2017-04-06 DIAGNOSIS — N2581 Secondary hyperparathyroidism of renal origin: Secondary | ICD-10-CM | POA: Diagnosis not present

## 2017-04-06 DIAGNOSIS — E119 Type 2 diabetes mellitus without complications: Secondary | ICD-10-CM | POA: Diagnosis not present

## 2017-04-06 DIAGNOSIS — E785 Hyperlipidemia, unspecified: Secondary | ICD-10-CM

## 2017-04-06 DIAGNOSIS — D631 Anemia in chronic kidney disease: Secondary | ICD-10-CM

## 2017-04-06 NOTE — Progress Notes (Signed)
Location:   [place] Room Number: 201 A Place of Service:  SNF (31)   CODE STATUS: Full Code  No Known Allergies  Chief Complaint  Patient presents with  . Medical Management of Chronic Issues    1 month follow up    HPI:  She is a [name] term resident of this facility being seen for the management of her chronic illnesses. She continues with hemodialysis three times weekly. Overall her status is stable. There are no nursing concerns at this time.    Past Medical History:  Diagnosis Date  . Anemia    Due to fibroids.  . Bilateral leg edema    a. Chronic.  Marland Kitchen Blind   . Cardiac arrest ([place]) 12/12/2014  . CHF (congestive heart failure) ([place])   . Coronary artery disease   . Deaf    USE SIGN INTERPRETER.  Divorced from husband but lives with him. Has daughter but she does not care for her.  . Diabetes mellitus    a. Per PCP note 2012 (A1C 9.2) - pt unwilling to take meds and was educated on risk of uncontrolled DM.  b. A1C 5.9 in 11/2013.  Marland Kitchen ESRD on hemodialysis ([place])    Mon, Wed, Fri  . Essential hypertension 08/09/2009   Qualifier: Diagnosis of  By: Sarita Haver  MD, Coralyn Mark    . Fibroid uterus   . Heart murmur   . Hyperlipidemia   . Hypertension    a. Has previously refused blood pressure meds.   . Leiomyoma of uterus 08/09/2009   Qualifier: Diagnosis of  By: Sarita Haver  MD, Coralyn Mark    . MI (myocardial infarction) 11/2014  . Moderate tricuspid regurgitation   . Osteomyelitis ([place])   . Psychiatric disorder    She frequently exhibits paranoia and has been diagnosed with psychotic d/o NOS during hospital stay in the past. She is tangetial and perseverative during her visits. She apparently has had a bad experience with mental health in [place] in the past and refuses to discuss mental issues for fear that she will be sent back there. Her paranoia, communication issues, financial woes and lack of fam  . Pulmonary hypertension     Past Surgical History:  Procedure  Laterality Date  . AMPUTATION Bilateral 04/02/2015   Procedure: Amputation Bilateral Great Toes MTP Joint;  Surgeon: Newt Minion, MD;  Location: [facility];  Service: Orthopedics;  Laterality: Bilateral;  . AMPUTATION Left 05/14/2015   Procedure: Left Transmetatarsal Amputation;  Surgeon: Newt Minion, MD;  Location: [facility];  Service: Orthopedics;  Laterality: Left;  . AMPUTATION Right 08/20/2015   Procedure: Transmetatarsal Amputation Right Foot;  Surgeon: Newt Minion, MD;  Location: [facility];  Service: Orthopedics;  Laterality: Right;  . AV FISTULA PLACEMENT Left 01/02/2014   Procedure: ARTERIOVENOUS (AV) FISTULA CREATION;  Surgeon: Rosetta Posner, MD;  Location: [facility];  Service: Vascular;  Laterality: Left;  . INSERTION OF DIALYSIS CATHETER Right 01/02/2014   Procedure: INSERTION OF DIALYSIS CATHETER;  Surgeon: Rosetta Posner, MD;  Location: [facility];  Service: Vascular;  Laterality: Right;  . NO PAST SURGERIES    . ORIF PATELLA Left 05/17/2015   Procedure: OPEN REDUCTION INTERNAL (ORIF) FIXATION PATELLA;  Surgeon: Newt Minion, MD;  Location: [facility];  Service: Orthopedics;  Laterality: Left;    Social History   Social History  . Marital status: Widowed    Spouse name: N/A  . Number of children: N/A  . Years of education:  N/A   Occupational History  . Not on file.   Social History Main Topics  . Smoking status: Never Smoker  . Smokeless tobacco: Never Used     Comment: Prior dip  . Alcohol use No  . Drug use: No  . Sexual activity: Not on file   Other Topics Concern  . Not on file   Social History Narrative  . No narrative on file   Family History  Problem Relation Age of Onset  . Other Father     Drowned from fishing?  . Heart disease        VITAL SIGNS BP 132/70   Pulse 86   Temp 97.5 F (36.4 C)   Resp 16   Ht 5\' 6"  (1.676 m)   Wt 153 lb 1.6 oz (69.4 kg)   SpO2 99%   BMI 24.71 kg/m   Patient's Medications  New Prescriptions   No medications on file  Previous  Medications   AMINO ACIDS-PROTEIN HYDROLYS (FEEDING SUPPLEMENT, PRO-STAT SUGAR FREE 64,) LIQD    Take 60 mLs by mouth 2 (two) times daily.   AMIODARONE (PACERONE) 200 MG TABLET    Take 200 mg by mouth daily.    ARIPIPRAZOLE (ABILIFY) 10 MG TABLET    Take 10 mg by mouth at bedtime.    CALCIUM ACETATE (PHOSLO) 667 MG TABLET    Take 3 tablets by mouth before meals for dialysis   CLOPIDOGREL (PLAVIX) 75 MG TABLET    Take 75 mg by mouth every evening.   METOPROLOL SUCCINATE (TOPROL-XL) 50 MG 24 HR TABLET    Take 50 mg by mouth every evening. Take with or immediately following a meal.   NITROGLYCERIN (NITROSTAT) 0.4 MG SL TABLET    Place 0.4 mg under the tongue every 5 (five) minutes as needed for chest pain.   POLYETHYLENE GLYCOL (MIRALAX / GLYCOLAX) PACKET    Take 17 g by mouth daily.    SKIN PROTECTANTS, MISC. (CALAZIME SKIN PROTECTANT EX)    Apply to buttocks 2 times daily   TRAMADOL (ULTRAM) 50 MG TABLET    Take 50 mg by mouth daily.  Modified Medications   No medications on file  Discontinued Medications   No medications on file     SIGNIFICANT DIAGNOSTIC EXAMS  11-24-14: Myoview: 1. Fixed defects involving the apex, anterior lateral wall and the inferior wall. The apex and anterior lateral wall fixed defects aresuggestive for an infarct. No evidence for reversibility or ischemia. 2. Diffuse hypokinesia and minimal wall motion along the lateral wall. 3. Left ventricular ejection fraction is 35%. 4. High-risk stress test findings*.  04-16-15: 2-d echo: - Left ventricle: The cavity size was mildly dilated. Wall thickness was increased in a pattern of mild LVH. Systolic function was moderately to severely reduced. The estimated ejection fraction was in the range of 30% to 35%. There is akinesis of the anteroseptal and apical myocardium. Features are consistent with a pseudonormal left ventricular filling pattern, with concomitant abnormal relaxation and increased filling pressure (grade 2  diastolic dysfunction). Doppler parameters are consistent with high ventricular filling pressure. - Mitral valve: Calcified annulus. There was mild regurgitation. - Left atrium: The atrium was mildly dilated. - Pulmonary arteries: Systolic pressure was mildly increased.     LABS REVIEWED: she will decline labs   05-17-16: wbc 6.3; hgb 11.3; hct 33.5; mcv 93.9; plt 269; glucose 66; bun 34.3; creat 4.38; k+ 4.3; na++ 139; liver normal albumin 3.9; tsh 3.17; hgb a1c 5.4; chol  232; ldl 134; trig 90; hdl 80 11-08-16: wbc 5.5; hgb 9.9; hct 31.2; mcv 99.3; plt 196; glucose 95; bun 71,2; creat 6.55; k+ 5.0; na++ 137; liver normal albumin 4.0; hgb a1c 5.4      Review of Systems Unable to perform ROS: Other    Physical Exam Constitutional: She appears well-developed and well-nourished. No distress.  Eyes: Conjunctivae are normal.  Neck: Neck supple. No JVD present. No thyromegaly present.  Cardiovascular: Normal rate, regular rhythm and intact distal pulses.  Murmur   Respiratory: Effort normal and breath sounds normal. No respiratory distress. She has no wheezes.  GI: Soft. Bowel sounds are normal. She exhibits no distension. There is no tenderness.  Musculoskeletal: She exhibits no edema.  Able to move all extremities  Has bilateral  transmetatarsal amputation; Lymphadenopathy:    She has no cervical adenopathy.  Neurological: She is alert.  Skin: Skin is warm and dry. She is not diaphoretic.  Left upper a/v fistula: +thril +bruit    ASSESSMENT/ PLAN:  1. ESRD on hemodialysis: is followed by nephrology. Will continue dialysis three times weekly. Will continue phoslo 667 mg 3 tabs  three times daily will monitor   2. Hypertension: is presently stable will continue toprol xl 50 mg daily   3. PAD: is without change is status post left and right transmetatarsal amputation: will continue plavix 75 mg daily  Will continue ultram 50 mg daily for pain management   4. Combine systolic and  diastolic heart failure: has ischemic cardiomyopathy  EF is 30-35%  (04-16-15); is presently stable will continue toprol xl 50 mg daily   5. Sustained V tach: not a candidate for ICD. Is on amiodarone 200 mg daily for heart rate control will monitor   6. FTT: her current weight is 153 pounds; her albumin is 4.0. Will continue supplements per facility protocol.   7. Psychosis; paranoid : will continue abilify 10 mg daily . Will monitor   She has gone through periods of time; when she will not take medications.   8. Constipation: will continue miralax daily    9. Diabetes: hgb a1c 5.4  is presently not on medications; will not make changes will monitor her status.   10. Dyslipidemia: is presently not on medications;   11. Anemia: hgb 9.9; will monitor   Will check cbc; cmp; lipids; hgb a1c and tsh   Ok Edwards NP [facility]  Contact [phone] Monday through Friday 8am- 5pm  After hours call [phone]

## 2017-04-09 DIAGNOSIS — T8131XS Disruption of external operation (surgical) wound, not elsewhere classified, sequela: Secondary | ICD-10-CM | POA: Diagnosis not present

## 2017-04-09 DIAGNOSIS — D631 Anemia in chronic kidney disease: Secondary | ICD-10-CM | POA: Diagnosis not present

## 2017-04-09 DIAGNOSIS — E1129 Type 2 diabetes mellitus with other diabetic kidney complication: Secondary | ICD-10-CM | POA: Diagnosis not present

## 2017-04-09 DIAGNOSIS — N186 End stage renal disease: Secondary | ICD-10-CM | POA: Diagnosis not present

## 2017-04-09 DIAGNOSIS — N2581 Secondary hyperparathyroidism of renal origin: Secondary | ICD-10-CM | POA: Diagnosis not present

## 2017-04-11 DIAGNOSIS — E1129 Type 2 diabetes mellitus with other diabetic kidney complication: Secondary | ICD-10-CM | POA: Diagnosis not present

## 2017-04-11 DIAGNOSIS — N186 End stage renal disease: Secondary | ICD-10-CM | POA: Diagnosis not present

## 2017-04-11 DIAGNOSIS — N2581 Secondary hyperparathyroidism of renal origin: Secondary | ICD-10-CM | POA: Diagnosis not present

## 2017-04-11 DIAGNOSIS — D631 Anemia in chronic kidney disease: Secondary | ICD-10-CM | POA: Diagnosis not present

## 2017-04-11 DIAGNOSIS — T8131XS Disruption of external operation (surgical) wound, not elsewhere classified, sequela: Secondary | ICD-10-CM | POA: Diagnosis not present

## 2017-04-12 DIAGNOSIS — T8131XS Disruption of external operation (surgical) wound, not elsewhere classified, sequela: Secondary | ICD-10-CM | POA: Diagnosis not present

## 2017-04-13 DIAGNOSIS — N186 End stage renal disease: Secondary | ICD-10-CM | POA: Diagnosis not present

## 2017-04-13 DIAGNOSIS — T8131XS Disruption of external operation (surgical) wound, not elsewhere classified, sequela: Secondary | ICD-10-CM | POA: Diagnosis not present

## 2017-04-13 DIAGNOSIS — E1129 Type 2 diabetes mellitus with other diabetic kidney complication: Secondary | ICD-10-CM | POA: Diagnosis not present

## 2017-04-13 DIAGNOSIS — D631 Anemia in chronic kidney disease: Secondary | ICD-10-CM | POA: Diagnosis not present

## 2017-04-13 DIAGNOSIS — N2581 Secondary hyperparathyroidism of renal origin: Secondary | ICD-10-CM | POA: Diagnosis not present

## 2017-04-16 DIAGNOSIS — N186 End stage renal disease: Secondary | ICD-10-CM | POA: Diagnosis not present

## 2017-04-16 DIAGNOSIS — N2581 Secondary hyperparathyroidism of renal origin: Secondary | ICD-10-CM | POA: Diagnosis not present

## 2017-04-16 DIAGNOSIS — E1129 Type 2 diabetes mellitus with other diabetic kidney complication: Secondary | ICD-10-CM | POA: Diagnosis not present

## 2017-04-16 DIAGNOSIS — T8131XS Disruption of external operation (surgical) wound, not elsewhere classified, sequela: Secondary | ICD-10-CM | POA: Diagnosis not present

## 2017-04-16 DIAGNOSIS — D631 Anemia in chronic kidney disease: Secondary | ICD-10-CM | POA: Diagnosis not present

## 2017-04-17 DIAGNOSIS — T8131XS Disruption of external operation (surgical) wound, not elsewhere classified, sequela: Secondary | ICD-10-CM | POA: Diagnosis not present

## 2017-04-18 DIAGNOSIS — N2581 Secondary hyperparathyroidism of renal origin: Secondary | ICD-10-CM | POA: Diagnosis not present

## 2017-04-18 DIAGNOSIS — N186 End stage renal disease: Secondary | ICD-10-CM | POA: Diagnosis not present

## 2017-04-18 DIAGNOSIS — T8131XS Disruption of external operation (surgical) wound, not elsewhere classified, sequela: Secondary | ICD-10-CM | POA: Diagnosis not present

## 2017-04-18 DIAGNOSIS — D631 Anemia in chronic kidney disease: Secondary | ICD-10-CM | POA: Diagnosis not present

## 2017-04-18 DIAGNOSIS — E1129 Type 2 diabetes mellitus with other diabetic kidney complication: Secondary | ICD-10-CM | POA: Diagnosis not present

## 2017-04-19 DIAGNOSIS — T8131XS Disruption of external operation (surgical) wound, not elsewhere classified, sequela: Secondary | ICD-10-CM | POA: Diagnosis not present

## 2017-04-20 DIAGNOSIS — N186 End stage renal disease: Secondary | ICD-10-CM | POA: Diagnosis not present

## 2017-04-20 DIAGNOSIS — E1129 Type 2 diabetes mellitus with other diabetic kidney complication: Secondary | ICD-10-CM | POA: Diagnosis not present

## 2017-04-20 DIAGNOSIS — D631 Anemia in chronic kidney disease: Secondary | ICD-10-CM | POA: Diagnosis not present

## 2017-04-20 DIAGNOSIS — N2581 Secondary hyperparathyroidism of renal origin: Secondary | ICD-10-CM | POA: Diagnosis not present

## 2017-04-20 DIAGNOSIS — T8131XS Disruption of external operation (surgical) wound, not elsewhere classified, sequela: Secondary | ICD-10-CM | POA: Diagnosis not present

## 2017-04-23 DIAGNOSIS — T8131XS Disruption of external operation (surgical) wound, not elsewhere classified, sequela: Secondary | ICD-10-CM | POA: Diagnosis not present

## 2017-04-23 DIAGNOSIS — E1129 Type 2 diabetes mellitus with other diabetic kidney complication: Secondary | ICD-10-CM | POA: Diagnosis not present

## 2017-04-23 DIAGNOSIS — N186 End stage renal disease: Secondary | ICD-10-CM | POA: Diagnosis not present

## 2017-04-23 DIAGNOSIS — Z992 Dependence on renal dialysis: Secondary | ICD-10-CM | POA: Diagnosis not present

## 2017-04-23 DIAGNOSIS — D631 Anemia in chronic kidney disease: Secondary | ICD-10-CM | POA: Diagnosis not present

## 2017-04-23 DIAGNOSIS — N2581 Secondary hyperparathyroidism of renal origin: Secondary | ICD-10-CM | POA: Diagnosis not present

## 2017-04-24 DIAGNOSIS — T8131XS Disruption of external operation (surgical) wound, not elsewhere classified, sequela: Secondary | ICD-10-CM | POA: Diagnosis not present

## 2017-04-25 DIAGNOSIS — Z23 Encounter for immunization: Secondary | ICD-10-CM | POA: Diagnosis not present

## 2017-04-25 DIAGNOSIS — E1129 Type 2 diabetes mellitus with other diabetic kidney complication: Secondary | ICD-10-CM | POA: Diagnosis not present

## 2017-04-25 DIAGNOSIS — N2581 Secondary hyperparathyroidism of renal origin: Secondary | ICD-10-CM | POA: Diagnosis not present

## 2017-04-25 DIAGNOSIS — Z283 Underimmunization status: Secondary | ICD-10-CM | POA: Diagnosis not present

## 2017-04-25 DIAGNOSIS — N186 End stage renal disease: Secondary | ICD-10-CM | POA: Diagnosis not present

## 2017-04-25 DIAGNOSIS — D631 Anemia in chronic kidney disease: Secondary | ICD-10-CM | POA: Diagnosis not present

## 2017-04-26 DIAGNOSIS — T8131XS Disruption of external operation (surgical) wound, not elsewhere classified, sequela: Secondary | ICD-10-CM | POA: Diagnosis not present

## 2017-04-27 DIAGNOSIS — N186 End stage renal disease: Secondary | ICD-10-CM | POA: Diagnosis not present

## 2017-04-27 DIAGNOSIS — E1129 Type 2 diabetes mellitus with other diabetic kidney complication: Secondary | ICD-10-CM | POA: Diagnosis not present

## 2017-04-27 DIAGNOSIS — T8131XS Disruption of external operation (surgical) wound, not elsewhere classified, sequela: Secondary | ICD-10-CM | POA: Diagnosis not present

## 2017-04-27 DIAGNOSIS — N2581 Secondary hyperparathyroidism of renal origin: Secondary | ICD-10-CM | POA: Diagnosis not present

## 2017-04-27 DIAGNOSIS — D631 Anemia in chronic kidney disease: Secondary | ICD-10-CM | POA: Diagnosis not present

## 2017-04-27 DIAGNOSIS — Z283 Underimmunization status: Secondary | ICD-10-CM | POA: Diagnosis not present

## 2017-04-27 DIAGNOSIS — Z23 Encounter for immunization: Secondary | ICD-10-CM | POA: Diagnosis not present

## 2017-04-28 DIAGNOSIS — T8131XS Disruption of external operation (surgical) wound, not elsewhere classified, sequela: Secondary | ICD-10-CM | POA: Diagnosis not present

## 2017-04-30 DIAGNOSIS — E1129 Type 2 diabetes mellitus with other diabetic kidney complication: Secondary | ICD-10-CM | POA: Diagnosis not present

## 2017-04-30 DIAGNOSIS — Z283 Underimmunization status: Secondary | ICD-10-CM | POA: Diagnosis not present

## 2017-04-30 DIAGNOSIS — Z23 Encounter for immunization: Secondary | ICD-10-CM | POA: Diagnosis not present

## 2017-04-30 DIAGNOSIS — N186 End stage renal disease: Secondary | ICD-10-CM | POA: Diagnosis not present

## 2017-04-30 DIAGNOSIS — N2581 Secondary hyperparathyroidism of renal origin: Secondary | ICD-10-CM | POA: Diagnosis not present

## 2017-04-30 DIAGNOSIS — T8131XS Disruption of external operation (surgical) wound, not elsewhere classified, sequela: Secondary | ICD-10-CM | POA: Diagnosis not present

## 2017-04-30 DIAGNOSIS — D631 Anemia in chronic kidney disease: Secondary | ICD-10-CM | POA: Diagnosis not present

## 2017-05-02 DIAGNOSIS — Z283 Underimmunization status: Secondary | ICD-10-CM | POA: Diagnosis not present

## 2017-05-02 DIAGNOSIS — D631 Anemia in chronic kidney disease: Secondary | ICD-10-CM | POA: Diagnosis not present

## 2017-05-02 DIAGNOSIS — E1129 Type 2 diabetes mellitus with other diabetic kidney complication: Secondary | ICD-10-CM | POA: Diagnosis not present

## 2017-05-02 DIAGNOSIS — N186 End stage renal disease: Secondary | ICD-10-CM | POA: Diagnosis not present

## 2017-05-02 DIAGNOSIS — N2581 Secondary hyperparathyroidism of renal origin: Secondary | ICD-10-CM | POA: Diagnosis not present

## 2017-05-02 DIAGNOSIS — Z23 Encounter for immunization: Secondary | ICD-10-CM | POA: Diagnosis not present

## 2017-05-04 DIAGNOSIS — N2581 Secondary hyperparathyroidism of renal origin: Secondary | ICD-10-CM | POA: Diagnosis not present

## 2017-05-04 DIAGNOSIS — N186 End stage renal disease: Secondary | ICD-10-CM | POA: Diagnosis not present

## 2017-05-04 DIAGNOSIS — Z283 Underimmunization status: Secondary | ICD-10-CM | POA: Diagnosis not present

## 2017-05-04 DIAGNOSIS — D631 Anemia in chronic kidney disease: Secondary | ICD-10-CM | POA: Diagnosis not present

## 2017-05-04 DIAGNOSIS — E1129 Type 2 diabetes mellitus with other diabetic kidney complication: Secondary | ICD-10-CM | POA: Diagnosis not present

## 2017-05-04 DIAGNOSIS — Z23 Encounter for immunization: Secondary | ICD-10-CM | POA: Diagnosis not present

## 2017-05-07 DIAGNOSIS — D631 Anemia in chronic kidney disease: Secondary | ICD-10-CM | POA: Diagnosis not present

## 2017-05-07 DIAGNOSIS — E1129 Type 2 diabetes mellitus with other diabetic kidney complication: Secondary | ICD-10-CM | POA: Diagnosis not present

## 2017-05-07 DIAGNOSIS — N2581 Secondary hyperparathyroidism of renal origin: Secondary | ICD-10-CM | POA: Diagnosis not present

## 2017-05-07 DIAGNOSIS — Z283 Underimmunization status: Secondary | ICD-10-CM | POA: Diagnosis not present

## 2017-05-07 DIAGNOSIS — Z23 Encounter for immunization: Secondary | ICD-10-CM | POA: Diagnosis not present

## 2017-05-07 DIAGNOSIS — N186 End stage renal disease: Secondary | ICD-10-CM | POA: Diagnosis not present

## 2017-05-08 ENCOUNTER — Non-Acute Institutional Stay (SKILLED_NURSING_FACILITY): Payer: Medicare Other | Admitting: Adult Health

## 2017-05-08 ENCOUNTER — Encounter: Payer: Self-pay | Admitting: Adult Health

## 2017-05-08 DIAGNOSIS — N186 End stage renal disease: Secondary | ICD-10-CM

## 2017-05-08 DIAGNOSIS — I5032 Chronic diastolic (congestive) heart failure: Secondary | ICD-10-CM | POA: Diagnosis not present

## 2017-05-08 DIAGNOSIS — I110 Hypertensive heart disease with heart failure: Secondary | ICD-10-CM | POA: Diagnosis not present

## 2017-05-08 DIAGNOSIS — D631 Anemia in chronic kidney disease: Secondary | ICD-10-CM

## 2017-05-08 DIAGNOSIS — Z992 Dependence on renal dialysis: Secondary | ICD-10-CM | POA: Diagnosis not present

## 2017-05-08 DIAGNOSIS — I472 Ventricular tachycardia, unspecified: Secondary | ICD-10-CM

## 2017-05-08 DIAGNOSIS — F22 Delusional disorders: Secondary | ICD-10-CM

## 2017-05-08 DIAGNOSIS — I5042 Chronic combined systolic (congestive) and diastolic (congestive) heart failure: Secondary | ICD-10-CM

## 2017-05-08 DIAGNOSIS — I739 Peripheral vascular disease, unspecified: Secondary | ICD-10-CM

## 2017-05-08 DIAGNOSIS — E1122 Type 2 diabetes mellitus with diabetic chronic kidney disease: Secondary | ICD-10-CM | POA: Diagnosis not present

## 2017-05-08 NOTE — Progress Notes (Addendum)
Location:   [facility] Room Number: 201 A Place of Service:  SNF (31)   CODE STATUS: Full Code  No Known Allergies  Chief Complaint  Patient presents with  . Medical Management of Chronic Issues    1 month follow up    HPI:  She is a [place] term resident of this facility being seen for the management of her chronic illnesses. She continues with dialysis three times weekly. She is unable to fully participate in the hpi or ros. There are no nursing concerns at this time.    Past Medical History:  Diagnosis Date  . Anemia    Due to fibroids.  . Bilateral leg edema    a. Chronic.  Marland Kitchen Blind   . Cardiac arrest ([place]) 12/12/2014  . CHF (congestive heart failure) ([place])   . Coronary artery disease   . Deaf    USE SIGN INTERPRETER.  Divorced from husband but lives with him. Has daughter but she does not care for her.  . Diabetes mellitus    a. Per PCP note 2012 (A1C 9.2) - pt unwilling to take meds and was educated on risk of uncontrolled DM.  b. A1C 5.9 in 11/2013.  Marland Kitchen ESRD on hemodialysis ([place])    Mon, Wed, Fri  . Essential hypertension 08/09/2009   Qualifier: Diagnosis of  By: Sarita Haver  MD, Coralyn Mark    . Fibroid uterus   . Heart murmur   . Hyperlipidemia   . Hypertension    a. Has previously refused blood pressure meds.   . Leiomyoma of uterus 08/09/2009   Qualifier: Diagnosis of  By: Sarita Haver  MD, Coralyn Mark    . MI (myocardial infarction) ([place]) 11/2014  . Moderate tricuspid regurgitation   . Osteomyelitis ([place])   . Psychiatric disorder    She frequently exhibits paranoia and has been diagnosed with psychotic d/o NOS during hospital stay in the past. She is tangetial and perseverative during her visits. She apparently has had a bad experience with mental health in [place] in the past and refuses to discuss mental issues for fear that she will be sent back there. Her paranoia, communication issues, financial woes and lack of fam  . Pulmonary hypertension ([place])     Past  Surgical History:  Procedure Laterality Date  . AMPUTATION Bilateral 04/02/2015   Procedure: Amputation Bilateral Great Toes MTP Joint;  Surgeon: Newt Minion, MD;  Location: [facility];  Service: Orthopedics;  Laterality: Bilateral;  . AMPUTATION Left 05/14/2015   Procedure: Left Transmetatarsal Amputation;  Surgeon: Newt Minion, MD;  Location: [facility];  Service: Orthopedics;  Laterality: Left;  . AMPUTATION Right 08/20/2015   Procedure: Transmetatarsal Amputation Right Foot;  Surgeon: Newt Minion, MD;  Location: [facility];  Service: Orthopedics;  Laterality: Right;  . AV FISTULA PLACEMENT Left 01/02/2014   Procedure: ARTERIOVENOUS (AV) FISTULA CREATION;  Surgeon: Rosetta Posner, MD;  Location: [facility];  Service: Vascular;  Laterality: Left;  . INSERTION OF DIALYSIS CATHETER Right 01/02/2014   Procedure: INSERTION OF DIALYSIS CATHETER;  Surgeon: Rosetta Posner, MD;  Location: [facility];  Service: Vascular;  Laterality: Right;  . NO PAST SURGERIES    . ORIF PATELLA Left 05/17/2015   Procedure: OPEN REDUCTION INTERNAL (ORIF) FIXATION PATELLA;  Surgeon: Newt Minion, MD;  Location: [facility];  Service: Orthopedics;  Laterality: Left;    Social History   Social History  . Marital status: Widowed    Spouse name: N/A  . Number  of children: N/A  . Years of education: N/A   Occupational History  . Not on file.   Social History Main Topics  . Smoking status: Never Smoker  . Smokeless tobacco: Never Used     Comment: Prior dip  . Alcohol use No  . Drug use: No  . Sexual activity: Not on file   Other Topics Concern  . Not on file   Social History Narrative  . No narrative on file   Family History  Problem Relation Age of Onset  . Other Father        Drowned from fishing?  . Heart disease Unknown       VITAL SIGNS BP 103/66   Pulse 67   Temp 97.3 F (36.3 C)   Resp 18   Ht 5\' 6"  (1.676 m)   Wt 152 lb 12.8 oz (69.3 kg)   SpO2 98%   BMI 24.66 kg/m   Patient's Medications  New Prescriptions    No medications on file  Previous Medications   AMINO ACIDS-PROTEIN HYDROLYS (FEEDING SUPPLEMENT, PRO-STAT SUGAR FREE 64,) LIQD    Take 60 mLs by mouth 2 (two) times daily.   AMIODARONE (PACERONE) 200 MG TABLET    Take 200 mg by mouth daily.    ARIPIPRAZOLE (ABILIFY) 10 MG TABLET    Take 10 mg by mouth at bedtime.    CALCIUM ACETATE (PHOSLO) 667 MG TABLET    Take 3 tablets by mouth before meals for dialysis   CLOPIDOGREL (PLAVIX) 75 MG TABLET    Take 75 mg by mouth every evening.   METOPROLOL SUCCINATE (TOPROL-XL) 50 MG 24 HR TABLET    Take 50 mg by mouth every evening. Take with or immediately following a meal.   NITROGLYCERIN (NITROSTAT) 0.4 MG SL TABLET    Place 0.4 mg under the tongue every 5 (five) minutes as needed for chest pain.   POLYETHYLENE GLYCOL (MIRALAX / GLYCOLAX) PACKET    Take 17 g by mouth daily.    SKIN PROTECTANTS, MISC. (CALAZIME SKIN PROTECTANT EX)    Apply to buttocks 2 times daily   TRAMADOL (ULTRAM) 50 MG TABLET    Take 50 mg by mouth daily.  Modified Medications   No medications on file  Discontinued Medications   No medications on file     SIGNIFICANT DIAGNOSTIC EXAMS  11-24-14: Myoview: 1. Fixed defects involving the apex, anterior lateral wall and the inferior wall. The apex and anterior lateral wall fixed defects aresuggestive for an infarct. No evidence for reversibility or ischemia. 2. Diffuse hypokinesia and minimal wall motion along the lateral wall. 3. Left ventricular ejection fraction is 35%. 4. High-risk stress test findings*.  04-16-15: 2-d echo: - Left ventricle: The cavity size was mildly dilated. Wall thickness was increased in a pattern of mild LVH. Systolic function was moderately to severely reduced. The estimated ejection fraction was in the range of 30% to 35%. There is akinesis of the anteroseptal and apical myocardium. Features are consistent with a pseudonormal left ventricular filling pattern, with concomitant abnormal relaxation and  increased filling pressure (grade 2 diastolic dysfunction). Doppler parameters are consistent with high ventricular filling pressure. - Mitral valve: Calcified annulus. There was mild regurgitation. - Left atrium: The atrium was mildly dilated. - Pulmonary arteries: Systolic pressure was mildly increased.     LABS REVIEWED: she will decline labs   05-17-16: wbc 6.3; hgb 11.3; hct 33.5; mcv 93.9; plt 269; glucose 66; bun 34.3; creat 4.38; k+ 4.3; na++  139; liver normal albumin 3.9; tsh 3.17; hgb a1c 5.4; chol 232; ldl 134; trig 90; hdl 80 11-08-16: wbc 5.5; hgb 9.9; hct 31.2; mcv 99.3; plt 196; glucose 95; bun 71,2; creat 6.55; k+ 5.0; na++ 137; liver normal albumin 4.0; hgb a1c 5.4   04-06-17: wbc 5.1; hgb 11.3; hct 34.2 mcv 96.3; plt 208; tsh 0.8; hgb a1c 5.5 chol 197; ldl 130; trig 51; hdl 57    Review of Systems Unable to perform ROS: Other    Physical Exam Constitutional: She appears well-developed and well-nourished. No distress.  Eyes: Conjunctivae are normal.  Neck: Neck supple. No JVD present. No thyromegaly present.  Cardiovascular: Normal rate, regular rhythm and intact distal pulses.  Murmur   Respiratory: Effort normal and breath sounds normal. No respiratory distress. She has no wheezes.  GI: Soft. Bowel sounds are normal. She exhibits no distension. There is no tenderness.  Musculoskeletal: She exhibits no edema.  Able to move all extremities  Has bilateral  transmetatarsal amputation; Lymphadenopathy:    She has no cervical adenopathy.  Neurological: She is alert.  Skin: Skin is warm and dry. She is not diaphoretic.  Left upper a/v fistula: +thril +bruit    ASSESSMENT/ PLAN:  1. ESRD on hemodialysis: is followed by nephrology. Will continue dialysis three times weekly. Will continue phoslo 667 mg 3 tabs  three times daily will monitor   2. Hypertension: 103/66  is presently stable will continue toprol xl 50 mg daily   3. PAD: is without change is status post  left and right transmetatarsal amputation: will continue plavix 75 mg daily  Will continue ultram 50 mg daily for pain management   4. 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 Sustained V tach: not a candidate for ICD. Is on amiodarone 200 mg daily for heart rate control will monitor   6. FTT: her current weight is 152 pounds; her albumin is 4.0. Will continue supplements per facility protocol.   7. Psychosis; paranoid : will continue abilify 10 mg daily . Will monitor   She has gone through periods of time; when she will not take medications.   8. Constipation: will continue miralax daily    9. Diabetes: hgb a1c 5.5  is presently not on medications; will not make changes will monitor her status.   10. Dyslipidemia: is presently not on medications; ldl 130  11. Anemia: hgb 11.3; will monitor      MD is aware of resident's narcotic use and is in agreement with current plan of care. We will attempt to wean resident as apropriate    Ok Edwards NP [facility]  Contact [phone] Monday through Friday 8am- 5pm  After hours call [phone]

## 2017-05-09 DIAGNOSIS — Z23 Encounter for immunization: Secondary | ICD-10-CM | POA: Diagnosis not present

## 2017-05-09 DIAGNOSIS — N186 End stage renal disease: Secondary | ICD-10-CM | POA: Diagnosis not present

## 2017-05-09 DIAGNOSIS — N2581 Secondary hyperparathyroidism of renal origin: Secondary | ICD-10-CM | POA: Diagnosis not present

## 2017-05-09 DIAGNOSIS — E1129 Type 2 diabetes mellitus with other diabetic kidney complication: Secondary | ICD-10-CM | POA: Diagnosis not present

## 2017-05-09 DIAGNOSIS — Z283 Underimmunization status: Secondary | ICD-10-CM | POA: Diagnosis not present

## 2017-05-09 DIAGNOSIS — D631 Anemia in chronic kidney disease: Secondary | ICD-10-CM | POA: Diagnosis not present

## 2017-05-11 DIAGNOSIS — N2581 Secondary hyperparathyroidism of renal origin: Secondary | ICD-10-CM | POA: Diagnosis not present

## 2017-05-11 DIAGNOSIS — Z283 Underimmunization status: Secondary | ICD-10-CM | POA: Diagnosis not present

## 2017-05-11 DIAGNOSIS — N186 End stage renal disease: Secondary | ICD-10-CM | POA: Diagnosis not present

## 2017-05-11 DIAGNOSIS — Z23 Encounter for immunization: Secondary | ICD-10-CM | POA: Diagnosis not present

## 2017-05-11 DIAGNOSIS — D631 Anemia in chronic kidney disease: Secondary | ICD-10-CM | POA: Diagnosis not present

## 2017-05-11 DIAGNOSIS — E1129 Type 2 diabetes mellitus with other diabetic kidney complication: Secondary | ICD-10-CM | POA: Diagnosis not present

## 2017-05-14 DIAGNOSIS — N2581 Secondary hyperparathyroidism of renal origin: Secondary | ICD-10-CM | POA: Diagnosis not present

## 2017-05-14 DIAGNOSIS — E1129 Type 2 diabetes mellitus with other diabetic kidney complication: Secondary | ICD-10-CM | POA: Diagnosis not present

## 2017-05-14 DIAGNOSIS — D631 Anemia in chronic kidney disease: Secondary | ICD-10-CM | POA: Diagnosis not present

## 2017-05-14 DIAGNOSIS — Z23 Encounter for immunization: Secondary | ICD-10-CM | POA: Diagnosis not present

## 2017-05-14 DIAGNOSIS — N186 End stage renal disease: Secondary | ICD-10-CM | POA: Diagnosis not present

## 2017-05-14 DIAGNOSIS — Z283 Underimmunization status: Secondary | ICD-10-CM | POA: Diagnosis not present

## 2017-05-16 DIAGNOSIS — N2581 Secondary hyperparathyroidism of renal origin: Secondary | ICD-10-CM | POA: Diagnosis not present

## 2017-05-16 DIAGNOSIS — Z283 Underimmunization status: Secondary | ICD-10-CM | POA: Diagnosis not present

## 2017-05-16 DIAGNOSIS — Z23 Encounter for immunization: Secondary | ICD-10-CM | POA: Diagnosis not present

## 2017-05-16 DIAGNOSIS — D631 Anemia in chronic kidney disease: Secondary | ICD-10-CM | POA: Diagnosis not present

## 2017-05-16 DIAGNOSIS — E1129 Type 2 diabetes mellitus with other diabetic kidney complication: Secondary | ICD-10-CM | POA: Diagnosis not present

## 2017-05-16 DIAGNOSIS — N186 End stage renal disease: Secondary | ICD-10-CM | POA: Diagnosis not present

## 2017-05-17 DIAGNOSIS — R4189 Other symptoms and signs involving cognitive functions and awareness: Secondary | ICD-10-CM | POA: Diagnosis not present

## 2017-05-17 DIAGNOSIS — F209 Schizophrenia, unspecified: Secondary | ICD-10-CM | POA: Diagnosis not present

## 2017-05-18 DIAGNOSIS — D631 Anemia in chronic kidney disease: Secondary | ICD-10-CM | POA: Diagnosis not present

## 2017-05-18 DIAGNOSIS — E1129 Type 2 diabetes mellitus with other diabetic kidney complication: Secondary | ICD-10-CM | POA: Diagnosis not present

## 2017-05-18 DIAGNOSIS — N2581 Secondary hyperparathyroidism of renal origin: Secondary | ICD-10-CM | POA: Diagnosis not present

## 2017-05-18 DIAGNOSIS — Z283 Underimmunization status: Secondary | ICD-10-CM | POA: Diagnosis not present

## 2017-05-18 DIAGNOSIS — Z23 Encounter for immunization: Secondary | ICD-10-CM | POA: Diagnosis not present

## 2017-05-18 DIAGNOSIS — N186 End stage renal disease: Secondary | ICD-10-CM | POA: Diagnosis not present

## 2017-05-21 DIAGNOSIS — N2581 Secondary hyperparathyroidism of renal origin: Secondary | ICD-10-CM | POA: Diagnosis not present

## 2017-05-21 DIAGNOSIS — N186 End stage renal disease: Secondary | ICD-10-CM | POA: Diagnosis not present

## 2017-05-21 DIAGNOSIS — Z23 Encounter for immunization: Secondary | ICD-10-CM | POA: Diagnosis not present

## 2017-05-21 DIAGNOSIS — Z283 Underimmunization status: Secondary | ICD-10-CM | POA: Diagnosis not present

## 2017-05-21 DIAGNOSIS — D631 Anemia in chronic kidney disease: Secondary | ICD-10-CM | POA: Diagnosis not present

## 2017-05-21 DIAGNOSIS — E1129 Type 2 diabetes mellitus with other diabetic kidney complication: Secondary | ICD-10-CM | POA: Diagnosis not present

## 2017-05-23 DIAGNOSIS — E1129 Type 2 diabetes mellitus with other diabetic kidney complication: Secondary | ICD-10-CM | POA: Diagnosis not present

## 2017-05-23 DIAGNOSIS — N186 End stage renal disease: Secondary | ICD-10-CM | POA: Diagnosis not present

## 2017-05-23 DIAGNOSIS — N2581 Secondary hyperparathyroidism of renal origin: Secondary | ICD-10-CM | POA: Diagnosis not present

## 2017-05-23 DIAGNOSIS — Z283 Underimmunization status: Secondary | ICD-10-CM | POA: Diagnosis not present

## 2017-05-23 DIAGNOSIS — D631 Anemia in chronic kidney disease: Secondary | ICD-10-CM | POA: Diagnosis not present

## 2017-05-23 DIAGNOSIS — Z23 Encounter for immunization: Secondary | ICD-10-CM | POA: Diagnosis not present

## 2017-05-24 DIAGNOSIS — E1129 Type 2 diabetes mellitus with other diabetic kidney complication: Secondary | ICD-10-CM | POA: Diagnosis not present

## 2017-05-24 DIAGNOSIS — Z992 Dependence on renal dialysis: Secondary | ICD-10-CM | POA: Diagnosis not present

## 2017-05-24 DIAGNOSIS — N186 End stage renal disease: Secondary | ICD-10-CM | POA: Diagnosis not present

## 2017-05-25 DIAGNOSIS — E1129 Type 2 diabetes mellitus with other diabetic kidney complication: Secondary | ICD-10-CM | POA: Diagnosis not present

## 2017-05-25 DIAGNOSIS — N186 End stage renal disease: Secondary | ICD-10-CM | POA: Diagnosis not present

## 2017-05-25 DIAGNOSIS — Z23 Encounter for immunization: Secondary | ICD-10-CM | POA: Diagnosis not present

## 2017-05-25 DIAGNOSIS — Z283 Underimmunization status: Secondary | ICD-10-CM | POA: Diagnosis not present

## 2017-05-25 DIAGNOSIS — D631 Anemia in chronic kidney disease: Secondary | ICD-10-CM | POA: Diagnosis not present

## 2017-05-25 DIAGNOSIS — N2581 Secondary hyperparathyroidism of renal origin: Secondary | ICD-10-CM | POA: Diagnosis not present

## 2017-05-28 DIAGNOSIS — N2581 Secondary hyperparathyroidism of renal origin: Secondary | ICD-10-CM | POA: Diagnosis not present

## 2017-05-28 DIAGNOSIS — N186 End stage renal disease: Secondary | ICD-10-CM | POA: Diagnosis not present

## 2017-05-28 DIAGNOSIS — D631 Anemia in chronic kidney disease: Secondary | ICD-10-CM | POA: Diagnosis not present

## 2017-05-28 DIAGNOSIS — Z283 Underimmunization status: Secondary | ICD-10-CM | POA: Diagnosis not present

## 2017-05-28 DIAGNOSIS — E1129 Type 2 diabetes mellitus with other diabetic kidney complication: Secondary | ICD-10-CM | POA: Diagnosis not present

## 2017-05-28 DIAGNOSIS — Z23 Encounter for immunization: Secondary | ICD-10-CM | POA: Diagnosis not present

## 2017-05-30 DIAGNOSIS — E1129 Type 2 diabetes mellitus with other diabetic kidney complication: Secondary | ICD-10-CM | POA: Diagnosis not present

## 2017-05-30 DIAGNOSIS — N186 End stage renal disease: Secondary | ICD-10-CM | POA: Diagnosis not present

## 2017-05-30 DIAGNOSIS — Z23 Encounter for immunization: Secondary | ICD-10-CM | POA: Diagnosis not present

## 2017-05-30 DIAGNOSIS — Z283 Underimmunization status: Secondary | ICD-10-CM | POA: Diagnosis not present

## 2017-05-30 DIAGNOSIS — N2581 Secondary hyperparathyroidism of renal origin: Secondary | ICD-10-CM | POA: Diagnosis not present

## 2017-05-30 DIAGNOSIS — D631 Anemia in chronic kidney disease: Secondary | ICD-10-CM | POA: Diagnosis not present

## 2017-06-01 DIAGNOSIS — E1129 Type 2 diabetes mellitus with other diabetic kidney complication: Secondary | ICD-10-CM | POA: Diagnosis not present

## 2017-06-01 DIAGNOSIS — N2581 Secondary hyperparathyroidism of renal origin: Secondary | ICD-10-CM | POA: Diagnosis not present

## 2017-06-01 DIAGNOSIS — Z23 Encounter for immunization: Secondary | ICD-10-CM | POA: Diagnosis not present

## 2017-06-01 DIAGNOSIS — D631 Anemia in chronic kidney disease: Secondary | ICD-10-CM | POA: Diagnosis not present

## 2017-06-01 DIAGNOSIS — N186 End stage renal disease: Secondary | ICD-10-CM | POA: Diagnosis not present

## 2017-06-01 DIAGNOSIS — Z283 Underimmunization status: Secondary | ICD-10-CM | POA: Diagnosis not present

## 2017-06-04 DIAGNOSIS — D631 Anemia in chronic kidney disease: Secondary | ICD-10-CM | POA: Diagnosis not present

## 2017-06-04 DIAGNOSIS — Z283 Underimmunization status: Secondary | ICD-10-CM | POA: Diagnosis not present

## 2017-06-04 DIAGNOSIS — E1129 Type 2 diabetes mellitus with other diabetic kidney complication: Secondary | ICD-10-CM | POA: Diagnosis not present

## 2017-06-04 DIAGNOSIS — N186 End stage renal disease: Secondary | ICD-10-CM | POA: Diagnosis not present

## 2017-06-04 DIAGNOSIS — Z23 Encounter for immunization: Secondary | ICD-10-CM | POA: Diagnosis not present

## 2017-06-04 DIAGNOSIS — N2581 Secondary hyperparathyroidism of renal origin: Secondary | ICD-10-CM | POA: Diagnosis not present

## 2017-06-05 ENCOUNTER — Encounter: Payer: Self-pay | Admitting: Adult Health

## 2017-06-05 ENCOUNTER — Non-Acute Institutional Stay (SKILLED_NURSING_FACILITY): Payer: Medicare Other | Admitting: Adult Health

## 2017-06-05 DIAGNOSIS — I110 Hypertensive heart disease with heart failure: Secondary | ICD-10-CM

## 2017-06-05 DIAGNOSIS — N186 End stage renal disease: Secondary | ICD-10-CM | POA: Diagnosis not present

## 2017-06-05 DIAGNOSIS — F22 Delusional disorders: Secondary | ICD-10-CM

## 2017-06-05 DIAGNOSIS — I739 Peripheral vascular disease, unspecified: Secondary | ICD-10-CM

## 2017-06-05 DIAGNOSIS — I472 Ventricular tachycardia, unspecified: Secondary | ICD-10-CM

## 2017-06-05 DIAGNOSIS — E785 Hyperlipidemia, unspecified: Secondary | ICD-10-CM | POA: Diagnosis not present

## 2017-06-05 DIAGNOSIS — E1169 Type 2 diabetes mellitus with other specified complication: Secondary | ICD-10-CM

## 2017-06-05 DIAGNOSIS — E1122 Type 2 diabetes mellitus with diabetic chronic kidney disease: Secondary | ICD-10-CM

## 2017-06-05 DIAGNOSIS — I5042 Chronic combined systolic (congestive) and diastolic (congestive) heart failure: Secondary | ICD-10-CM | POA: Diagnosis not present

## 2017-06-05 DIAGNOSIS — I5032 Chronic diastolic (congestive) heart failure: Secondary | ICD-10-CM

## 2017-06-05 NOTE — Progress Notes (Signed)
Location:   [facility] Room Number: 202 A Place of Service:  SNF (31)   CODE STATUS: Full Code  No Known Allergies  Chief Complaint  Patient presents with  . Medical Management of Chronic Issues    1 month follow up    HPI:  She is a [place] term resident of this facility being seen for the management of her chronic illnesses. Overall her status is stable. She is unable to participate in the hpi or ros. There are no nursing concerns at this time.    Past Medical History:  Diagnosis Date  . Anemia    Due to fibroids.  . Bilateral leg edema    a. Chronic.  Marland Kitchen Blind   . Cardiac arrest ([place]) 12/12/2014  . CHF (congestive heart failure) ([place])   . Coronary artery disease   . Deaf    USE SIGN INTERPRETER.  Divorced from husband but lives with him. Has daughter but she does not care for her.  . Diabetes mellitus    a. Per PCP note 2012 (A1C 9.2) - pt unwilling to take meds and was educated on risk of uncontrolled DM.  b. A1C 5.9 in 11/2013.  Marland Kitchen ESRD on hemodialysis ([place])    Mon, Wed, Fri  . Essential hypertension 08/09/2009   Qualifier: Diagnosis of  By: Sarita Haver  MD, Coralyn Mark    . Fibroid uterus   . Heart murmur   . Hyperlipidemia   . Hypertension    a. Has previously refused blood pressure meds.   . Leiomyoma of uterus 08/09/2009   Qualifier: Diagnosis of  By: Sarita Haver  MD, Coralyn Mark    . MI (myocardial infarction) ([place]) 11/2014  . Moderate tricuspid regurgitation   . Osteomyelitis ([place])   . Psychiatric disorder    She frequently exhibits paranoia and has been diagnosed with psychotic d/o NOS during hospital stay in the past. She is tangetial and perseverative during her visits. She apparently has had a bad experience with mental health in [place] in the past and refuses to discuss mental issues for fear that she will be sent back there. Her paranoia, communication issues, financial woes and lack of fam  . Pulmonary hypertension ([place])     Past Surgical History:  Procedure  Laterality Date  . AMPUTATION Bilateral 04/02/2015   Procedure: Amputation Bilateral Great Toes MTP Joint;  Surgeon: Newt Minion, MD;  Location: [facility];  Service: Orthopedics;  Laterality: Bilateral;  . AMPUTATION Left 05/14/2015   Procedure: Left Transmetatarsal Amputation;  Surgeon: Newt Minion, MD;  Location:  [facility];  Service: Orthopedics;  Laterality: Left;  . AMPUTATION Right 08/20/2015   Procedure: Transmetatarsal Amputation Right Foot;  Surgeon: Newt Minion, MD;  Location: [facility];  Service: Orthopedics;  Laterality: Right;  . AV FISTULA PLACEMENT Left 01/02/2014   Procedure: ARTERIOVENOUS (AV) FISTULA CREATION;  Surgeon: Rosetta Posner, MD;  Location: [facility];  Service: Vascular;  Laterality: Left;  . INSERTION OF DIALYSIS CATHETER Right 01/02/2014   Procedure: INSERTION OF DIALYSIS CATHETER;  Surgeon: Rosetta Posner, MD;  Location: [facility];  Service: Vascular;  Laterality: Right;  . NO PAST SURGERIES    . ORIF PATELLA Left 05/17/2015   Procedure: OPEN REDUCTION INTERNAL (ORIF) FIXATION PATELLA;  Surgeon: Newt Minion, MD;  Location: [facility];  Service: Orthopedics;  Laterality: Left;    Social History   Social History  . Marital status: Widowed    Spouse name: N/A  . Number of children: N/A  .  Years of education: N/A   Occupational History  . Not on file.   Social History Main Topics  . Smoking status: Never Smoker  . Smokeless tobacco: Never Used     Comment: Prior dip  . Alcohol use No  . Drug use: No  . Sexual activity: Not on file   Other Topics Concern  . Not on file   Social History Narrative  . No narrative on file   Family History  Problem Relation Age of Onset  . Other Father        Drowned from fishing?  . Heart disease Unknown       VITAL SIGNS BP 118/78   Temp 98 F (36.7 C)   Ht 5\' 5"  (1.651 m)   Wt 152 lb 12.8 oz (69.3 kg)   BMI 25.43 kg/m   Patient's Medications  New Prescriptions   No medications on file  Previous Medications   AMINO  ACIDS-PROTEIN HYDROLYS (FEEDING SUPPLEMENT, PRO-STAT SUGAR FREE 64,) LIQD    Take 60 mLs by mouth 2 (two) times daily.   AMIODARONE (PACERONE) 200 MG TABLET    Take 200 mg by mouth every evening.    ARIPIPRAZOLE (ABILIFY) 10 MG TABLET    Take 10 mg by mouth in AM    CALCIUM ACETATE (PHOSLO) 667 MG TABLET    Take 3 tablets by mouth before meals every Sunday, Tuesday, Thursday and Saturday for Dialysis   CLOPIDOGREL (PLAVIX) 75 MG TABLET    Take 75 mg by mouth every evening.   METOPROLOL SUCCINATE (TOPROL-XL) 50 MG 24 HR TABLET    Take 50 mg by mouth every evening. Take with or immediately following a meal.   NITROGLYCERIN (NITROSTAT) 0.4 MG SL TABLET    Place 0.4 mg under the tongue every 5 (five) minutes as needed for chest pain.   POLYETHYLENE GLYCOL (MIRALAX / GLYCOLAX) PACKET    Take 17 g by mouth daily.    SKIN PROTECTANTS, MISC. (CALAZIME SKIN PROTECTANT EX)    Apply to buttocks 2 times daily   TRAMADOL (ULTRAM) 50 MG TABLET    Take 50 mg by mouth daily.  Modified Medications   No medications on file  Discontinued Medications   No medications on file     SIGNIFICANT DIAGNOSTIC EXAMS  11-24-14: Myoview: 1. Fixed defects involving the apex, anterior lateral wall and the inferior wall. The apex and anterior lateral wall fixed defects aresuggestive for an infarct. No evidence for reversibility or ischemia. 2. Diffuse hypokinesia and minimal wall motion along the lateral wall. 3. Left ventricular ejection fraction is 35%. 4. High-risk stress test findings*.  04-16-15: 2-d echo: - Left ventricle: The cavity size was mildly dilated. Wall thickness was increased in a pattern of mild LVH. Systolic function was moderately to severely reduced. The estimated ejection fraction was in the range of 30% to 35%. There is akinesis of the anteroseptal and apical myocardium. Features are consistent with a pseudonormal left ventricular filling pattern, with concomitant abnormal relaxation and increased  filling pressure (grade 2 diastolic dysfunction). Doppler parameters are consistent with high ventricular filling pressure. - Mitral valve: Calcified annulus. There was mild regurgitation. - Left atrium: The atrium was mildly dilated. - Pulmonary arteries: Systolic pressure was mildly increased.     LABS REVIEWED: she will decline labs   11-08-16: wbc 5.5; hgb 9.9; hct 31.2; mcv 99.3; plt 196; glucose 95; bun 71,2; creat 6.55; k+ 5.0; na++ 137; liver normal albumin 4.0; hgb a1c 5.4  04-06-17: wbc 5.1; hgb 11.3; hct 34.2 mcv 96.3; plt 208; tsh 0.8; hgb a1c 5.5 chol 197; ldl 130; trig 51; hdl 57    Review of Systems Unable to perform ROS: Other    Physical Exam Constitutional: She appears well-developed and well-nourished. No distress.  Eyes: Conjunctivae are normal.  Neck: Neck supple. No JVD present. No thyromegaly present.  Cardiovascular: Normal rate, regular rhythm and intact distal pulses.  Murmur   Respiratory: Effort normal and breath sounds normal. No respiratory distress. She has no wheezes.  GI: Soft. Bowel sounds are normal. She exhibits no distension. There is no tenderness.  Musculoskeletal: She exhibits no edema.  Able to move all extremities  Has bilateral  transmetatarsal amputation; Lymphadenopathy:    She has no cervical adenopathy.  Neurological: She is alert.  Skin: Skin is warm and dry. She is not diaphoretic.  Left upper a/v fistula: +thril +bruit    ASSESSMENT/ PLAN:  1. ESRD on hemodialysis: is followed by nephrology. Will continue dialysis three times weekly. Will continue phoslo 667 mg 3 tabs  three times daily on dialysis day and will monitor   2. Hypertension: 118/78  is presently stable will continue toprol xl 50 mg daily   3. PAD: is without change is status post left and right transmetatarsal amputation: will continue plavix 75 mg daily  Will continue ultram 50 mg daily for pain management   4. Combine systolic and diastolic heart failure: has  ischemic cardiomyopathy  EF is 30-35%  (04-16-15); is presently stable will continue toprol xl 50 mg daily   5. Sustained V tach: not a candidate for ICD. Is on amiodarone 200 mg daily for heart rate control will monitor   6. FTT: her current weight is 152 pounds; her albumin is 4.0. Will continue supplements per facility protocol.   7. Psychosis; paranoid : will continue abilify 10 mg daily . Will monitor   She does not tolerate any changes to her abilify when this occurs she will become more psychotic   8. Constipation: will continue miralax daily    9. Diabetes: hgb a1c 5.5  is presently not on medications; will not make changes will monitor her status.   10. Dyslipidemia: is presently not on medications; ldl 130  11. Anemia: hgb 11.3; will monitor    MD is aware of resident's narcotic use and is in agreement with current plan of care. We will attempt to wean resident as apropriate     Ok Edwards NP [facility]  Contact [phone] Monday through Friday 8am- 5pm  After hours call [phone]

## 2017-06-06 DIAGNOSIS — F29 Unspecified psychosis not due to a substance or known physiological condition: Secondary | ICD-10-CM | POA: Diagnosis not present

## 2017-06-06 DIAGNOSIS — Z283 Underimmunization status: Secondary | ICD-10-CM | POA: Diagnosis not present

## 2017-06-06 DIAGNOSIS — F329 Major depressive disorder, single episode, unspecified: Secondary | ICD-10-CM | POA: Diagnosis not present

## 2017-06-06 DIAGNOSIS — N2581 Secondary hyperparathyroidism of renal origin: Secondary | ICD-10-CM | POA: Diagnosis not present

## 2017-06-06 DIAGNOSIS — N186 End stage renal disease: Secondary | ICD-10-CM | POA: Diagnosis not present

## 2017-06-06 DIAGNOSIS — D631 Anemia in chronic kidney disease: Secondary | ICD-10-CM | POA: Diagnosis not present

## 2017-06-06 DIAGNOSIS — E1129 Type 2 diabetes mellitus with other diabetic kidney complication: Secondary | ICD-10-CM | POA: Diagnosis not present

## 2017-06-06 DIAGNOSIS — R4189 Other symptoms and signs involving cognitive functions and awareness: Secondary | ICD-10-CM | POA: Diagnosis not present

## 2017-06-06 DIAGNOSIS — Z23 Encounter for immunization: Secondary | ICD-10-CM | POA: Diagnosis not present

## 2017-06-06 DIAGNOSIS — F209 Schizophrenia, unspecified: Secondary | ICD-10-CM | POA: Diagnosis not present

## 2017-06-08 DIAGNOSIS — N186 End stage renal disease: Secondary | ICD-10-CM | POA: Diagnosis not present

## 2017-06-08 DIAGNOSIS — Z23 Encounter for immunization: Secondary | ICD-10-CM | POA: Diagnosis not present

## 2017-06-08 DIAGNOSIS — D631 Anemia in chronic kidney disease: Secondary | ICD-10-CM | POA: Diagnosis not present

## 2017-06-08 DIAGNOSIS — E1129 Type 2 diabetes mellitus with other diabetic kidney complication: Secondary | ICD-10-CM | POA: Diagnosis not present

## 2017-06-08 DIAGNOSIS — Z283 Underimmunization status: Secondary | ICD-10-CM | POA: Diagnosis not present

## 2017-06-08 DIAGNOSIS — N2581 Secondary hyperparathyroidism of renal origin: Secondary | ICD-10-CM | POA: Diagnosis not present

## 2017-06-11 DIAGNOSIS — Z283 Underimmunization status: Secondary | ICD-10-CM | POA: Diagnosis not present

## 2017-06-11 DIAGNOSIS — N186 End stage renal disease: Secondary | ICD-10-CM | POA: Diagnosis not present

## 2017-06-11 DIAGNOSIS — E1129 Type 2 diabetes mellitus with other diabetic kidney complication: Secondary | ICD-10-CM | POA: Diagnosis not present

## 2017-06-11 DIAGNOSIS — D631 Anemia in chronic kidney disease: Secondary | ICD-10-CM | POA: Diagnosis not present

## 2017-06-11 DIAGNOSIS — Z23 Encounter for immunization: Secondary | ICD-10-CM | POA: Diagnosis not present

## 2017-06-11 DIAGNOSIS — N2581 Secondary hyperparathyroidism of renal origin: Secondary | ICD-10-CM | POA: Diagnosis not present

## 2017-06-13 DIAGNOSIS — Z23 Encounter for immunization: Secondary | ICD-10-CM | POA: Diagnosis not present

## 2017-06-13 DIAGNOSIS — N186 End stage renal disease: Secondary | ICD-10-CM | POA: Diagnosis not present

## 2017-06-13 DIAGNOSIS — E1129 Type 2 diabetes mellitus with other diabetic kidney complication: Secondary | ICD-10-CM | POA: Diagnosis not present

## 2017-06-13 DIAGNOSIS — D631 Anemia in chronic kidney disease: Secondary | ICD-10-CM | POA: Diagnosis not present

## 2017-06-13 DIAGNOSIS — Z283 Underimmunization status: Secondary | ICD-10-CM | POA: Diagnosis not present

## 2017-06-13 DIAGNOSIS — N2581 Secondary hyperparathyroidism of renal origin: Secondary | ICD-10-CM | POA: Diagnosis not present

## 2017-06-15 DIAGNOSIS — E1129 Type 2 diabetes mellitus with other diabetic kidney complication: Secondary | ICD-10-CM | POA: Diagnosis not present

## 2017-06-15 DIAGNOSIS — D631 Anemia in chronic kidney disease: Secondary | ICD-10-CM | POA: Diagnosis not present

## 2017-06-15 DIAGNOSIS — Z23 Encounter for immunization: Secondary | ICD-10-CM | POA: Diagnosis not present

## 2017-06-15 DIAGNOSIS — N2581 Secondary hyperparathyroidism of renal origin: Secondary | ICD-10-CM | POA: Diagnosis not present

## 2017-06-15 DIAGNOSIS — N186 End stage renal disease: Secondary | ICD-10-CM | POA: Diagnosis not present

## 2017-06-15 DIAGNOSIS — Z283 Underimmunization status: Secondary | ICD-10-CM | POA: Diagnosis not present

## 2017-06-18 DIAGNOSIS — N186 End stage renal disease: Secondary | ICD-10-CM | POA: Diagnosis not present

## 2017-06-18 DIAGNOSIS — Z23 Encounter for immunization: Secondary | ICD-10-CM | POA: Diagnosis not present

## 2017-06-18 DIAGNOSIS — Z283 Underimmunization status: Secondary | ICD-10-CM | POA: Diagnosis not present

## 2017-06-18 DIAGNOSIS — N2581 Secondary hyperparathyroidism of renal origin: Secondary | ICD-10-CM | POA: Diagnosis not present

## 2017-06-18 DIAGNOSIS — E1129 Type 2 diabetes mellitus with other diabetic kidney complication: Secondary | ICD-10-CM | POA: Diagnosis not present

## 2017-06-18 DIAGNOSIS — D631 Anemia in chronic kidney disease: Secondary | ICD-10-CM | POA: Diagnosis not present

## 2017-06-20 DIAGNOSIS — Z283 Underimmunization status: Secondary | ICD-10-CM | POA: Diagnosis not present

## 2017-06-20 DIAGNOSIS — D631 Anemia in chronic kidney disease: Secondary | ICD-10-CM | POA: Diagnosis not present

## 2017-06-20 DIAGNOSIS — Z23 Encounter for immunization: Secondary | ICD-10-CM | POA: Diagnosis not present

## 2017-06-20 DIAGNOSIS — N186 End stage renal disease: Secondary | ICD-10-CM | POA: Diagnosis not present

## 2017-06-20 DIAGNOSIS — N2581 Secondary hyperparathyroidism of renal origin: Secondary | ICD-10-CM | POA: Diagnosis not present

## 2017-06-20 DIAGNOSIS — E1129 Type 2 diabetes mellitus with other diabetic kidney complication: Secondary | ICD-10-CM | POA: Diagnosis not present

## 2017-06-22 DIAGNOSIS — E1129 Type 2 diabetes mellitus with other diabetic kidney complication: Secondary | ICD-10-CM | POA: Diagnosis not present

## 2017-06-22 DIAGNOSIS — N186 End stage renal disease: Secondary | ICD-10-CM | POA: Diagnosis not present

## 2017-06-22 DIAGNOSIS — Z283 Underimmunization status: Secondary | ICD-10-CM | POA: Diagnosis not present

## 2017-06-22 DIAGNOSIS — D631 Anemia in chronic kidney disease: Secondary | ICD-10-CM | POA: Diagnosis not present

## 2017-06-22 DIAGNOSIS — Z23 Encounter for immunization: Secondary | ICD-10-CM | POA: Diagnosis not present

## 2017-06-22 DIAGNOSIS — N2581 Secondary hyperparathyroidism of renal origin: Secondary | ICD-10-CM | POA: Diagnosis not present

## 2017-06-23 DIAGNOSIS — E1129 Type 2 diabetes mellitus with other diabetic kidney complication: Secondary | ICD-10-CM | POA: Diagnosis not present

## 2017-06-23 DIAGNOSIS — N186 End stage renal disease: Secondary | ICD-10-CM | POA: Diagnosis not present

## 2017-06-23 DIAGNOSIS — Z992 Dependence on renal dialysis: Secondary | ICD-10-CM | POA: Diagnosis not present

## 2017-06-25 ENCOUNTER — Encounter: Payer: Self-pay | Admitting: Adult Health

## 2017-06-25 DIAGNOSIS — R4701 Aphasia: Secondary | ICD-10-CM | POA: Insufficient documentation

## 2017-06-25 DIAGNOSIS — N2581 Secondary hyperparathyroidism of renal origin: Secondary | ICD-10-CM | POA: Diagnosis not present

## 2017-06-25 DIAGNOSIS — D631 Anemia in chronic kidney disease: Secondary | ICD-10-CM | POA: Diagnosis not present

## 2017-06-25 DIAGNOSIS — E1129 Type 2 diabetes mellitus with other diabetic kidney complication: Secondary | ICD-10-CM | POA: Diagnosis not present

## 2017-06-25 DIAGNOSIS — N186 End stage renal disease: Secondary | ICD-10-CM | POA: Diagnosis not present

## 2017-06-27 DIAGNOSIS — E1129 Type 2 diabetes mellitus with other diabetic kidney complication: Secondary | ICD-10-CM | POA: Diagnosis not present

## 2017-06-27 DIAGNOSIS — N2581 Secondary hyperparathyroidism of renal origin: Secondary | ICD-10-CM | POA: Diagnosis not present

## 2017-06-27 DIAGNOSIS — D631 Anemia in chronic kidney disease: Secondary | ICD-10-CM | POA: Diagnosis not present

## 2017-06-27 DIAGNOSIS — N186 End stage renal disease: Secondary | ICD-10-CM | POA: Diagnosis not present

## 2017-06-29 DIAGNOSIS — D631 Anemia in chronic kidney disease: Secondary | ICD-10-CM | POA: Diagnosis not present

## 2017-06-29 DIAGNOSIS — N186 End stage renal disease: Secondary | ICD-10-CM | POA: Diagnosis not present

## 2017-06-29 DIAGNOSIS — E1129 Type 2 diabetes mellitus with other diabetic kidney complication: Secondary | ICD-10-CM | POA: Diagnosis not present

## 2017-06-29 DIAGNOSIS — N2581 Secondary hyperparathyroidism of renal origin: Secondary | ICD-10-CM | POA: Diagnosis not present

## 2017-07-02 ENCOUNTER — Non-Acute Institutional Stay (SKILLED_NURSING_FACILITY): Payer: Medicare Other | Admitting: Adult Health

## 2017-07-02 ENCOUNTER — Encounter: Payer: Self-pay | Admitting: Adult Health

## 2017-07-02 DIAGNOSIS — I739 Peripheral vascular disease, unspecified: Secondary | ICD-10-CM | POA: Diagnosis not present

## 2017-07-02 DIAGNOSIS — D631 Anemia in chronic kidney disease: Secondary | ICD-10-CM | POA: Diagnosis not present

## 2017-07-02 DIAGNOSIS — I5032 Chronic diastolic (congestive) heart failure: Secondary | ICD-10-CM | POA: Diagnosis not present

## 2017-07-02 DIAGNOSIS — N186 End stage renal disease: Secondary | ICD-10-CM

## 2017-07-02 DIAGNOSIS — E1169 Type 2 diabetes mellitus with other specified complication: Secondary | ICD-10-CM

## 2017-07-02 DIAGNOSIS — Z992 Dependence on renal dialysis: Secondary | ICD-10-CM

## 2017-07-02 DIAGNOSIS — F22 Delusional disorders: Secondary | ICD-10-CM

## 2017-07-02 DIAGNOSIS — I472 Ventricular tachycardia, unspecified: Secondary | ICD-10-CM

## 2017-07-02 DIAGNOSIS — E785 Hyperlipidemia, unspecified: Secondary | ICD-10-CM

## 2017-07-02 DIAGNOSIS — I110 Hypertensive heart disease with heart failure: Secondary | ICD-10-CM

## 2017-07-02 DIAGNOSIS — N2581 Secondary hyperparathyroidism of renal origin: Secondary | ICD-10-CM | POA: Diagnosis not present

## 2017-07-02 DIAGNOSIS — E1122 Type 2 diabetes mellitus with diabetic chronic kidney disease: Secondary | ICD-10-CM

## 2017-07-02 DIAGNOSIS — E1129 Type 2 diabetes mellitus with other diabetic kidney complication: Secondary | ICD-10-CM | POA: Diagnosis not present

## 2017-07-02 DIAGNOSIS — I5042 Chronic combined systolic (congestive) and diastolic (congestive) heart failure: Secondary | ICD-10-CM

## 2017-07-02 NOTE — Progress Notes (Signed)
Location:   [facility] Room Number: 202 A Place of Service:  SNF (31)   CODE STATUS: Full Code  No Known Allergies  Chief Complaint  Patient presents with  . Medical Management of Chronic Issues    1 month follow up    HPI:  She is a 61 year old [place] term resident of this facility being seen for the management of her chronic illnesses. She is able to participate in the hpi or ros. There are no nursing concerns at this time. She does continue with dialysis   Past Medical History:  Diagnosis Date  . Anemia    Due to fibroids.  . Bilateral leg edema    a. Chronic.  Marland Kitchen Blind   . Cardiac arrest ([place]) 12/12/2014  . CHF (congestive heart failure) ([place])   . Coronary artery disease   . Deaf    USE SIGN INTERPRETER.  Divorced from husband but lives with him. Has daughter but she does not care for her.  . Diabetes mellitus    a. Per PCP note 2012 (A1C 9.2) - pt unwilling to take meds and was educated on risk of uncontrolled DM.  b. A1C 5.9 in 11/2013.  Marland Kitchen ESRD on hemodialysis ([place])    Mon, Wed, Fri  . Essential hypertension 08/09/2009   Qualifier: Diagnosis of  By: Sarita Haver  MD, Coralyn Mark    . Fibroid uterus   . Heart murmur   . Hyperlipidemia   . Hypertension    a. Has previously refused blood pressure meds.   . Leiomyoma of uterus 08/09/2009   Qualifier: Diagnosis of  By: Sarita Haver  MD, Coralyn Mark    . MI (myocardial infarction) ([place]) 11/2014  . Moderate tricuspid regurgitation   . Osteomyelitis ([place])   . Psychiatric disorder    She frequently exhibits paranoia and has been diagnosed with psychotic d/o NOS during hospital stay in the past. She is tangetial and perseverative during her visits. She apparently has had a bad experience with mental health in [place] in the past and refuses to discuss mental issues for fear that she will be sent back there. Her paranoia, communication issues, financial woes and lack of fam  . Pulmonary hypertension ([place])     Past Surgical History:    Procedure Laterality Date  . AMPUTATION Bilateral 04/02/2015   Procedure: Amputation Bilateral Great Toes MTP Joint;  Surgeon: Newt Minion, MD;  Location: [facility];  Service: Orthopedics;  Laterality: Bilateral;  . AMPUTATION Left 05/14/2015   Procedure: Left Transmetatarsal Amputation;  Surgeon: Newt Minion, MD;  Location: [facility];  Service: Orthopedics;  Laterality: Left;  . AMPUTATION Right 08/20/2015   Procedure: Transmetatarsal Amputation Right Foot;  Surgeon: Newt Minion, MD;  Location: [facility];  Service: Orthopedics;  Laterality: Right;  . AV FISTULA PLACEMENT Left 01/02/2014   Procedure: ARTERIOVENOUS (AV) FISTULA CREATION;  Surgeon: Rosetta Posner, MD;  Location: [facility];  Service: Vascular;  Laterality: Left;  . INSERTION OF DIALYSIS CATHETER Right 01/02/2014   Procedure: INSERTION OF DIALYSIS CATHETER;  Surgeon: Rosetta Posner, MD;  Location: [facility];  Service: Vascular;  Laterality: Right;  . NO PAST SURGERIES    . ORIF PATELLA Left 05/17/2015   Procedure: OPEN REDUCTION INTERNAL (ORIF) FIXATION PATELLA;  Surgeon: Newt Minion, MD;  Location: [facility];  Service: Orthopedics;  Laterality: Left;    Social History   Social History  . Marital status: Widowed    Spouse name: N/A  . Number  of children: N/A  . Years of education: N/A   Occupational History  . Not on file.   Social History Main Topics  . Smoking status: Never Smoker  . Smokeless tobacco: Never Used     Comment: Prior dip  . Alcohol use No  . Drug use: No  . Sexual activity: Not on file   Other Topics Concern  . Not on file   Social History Narrative  . No narrative on file   Family History  Problem Relation Age of Onset  . Other Father        Drowned from fishing?  . Heart disease Unknown       VITAL SIGNS BP (!) 160/82   Pulse 68   Temp 98 F (36.7 C)   Resp 18   Ht 5\' 5"  (1.651 m)   Wt 154 lb 9.6 oz (70.1 kg)   SpO2 98%   BMI 25.73 kg/m   Patient's Medications  New Prescriptions   No medications on  file  Previous Medications   AMINO ACIDS-PROTEIN HYDROLYS (FEEDING SUPPLEMENT, PRO-STAT SUGAR FREE 64,) LIQD    Take 60 mLs by mouth 2 (two) times daily.   AMIODARONE (PACERONE) 200 MG TABLET    Take 200 mg by mouth every evening.    ARIPIPRAZOLE (ABILIFY) 10 MG TABLET    Take 10 mg by mouth at bedtime.    CALCIUM ACETATE (PHOSLO) 667 MG TABLET    Take 3 tablets by mouth before meals every Sunday, Tuesday, Thursday and Saturday for Dialysis.  Give 3 tablets by mouth two times a day every Mon, Wed, Friday for renal buffer   CLOPIDOGREL (PLAVIX) 75 MG TABLET    Take 75 mg by mouth every evening.   METOPROLOL SUCCINATE (TOPROL-XL) 50 MG 24 HR TABLET    Take 50 mg by mouth every evening. Take with or immediately following a meal.   NITROGLYCERIN (NITROSTAT) 0.4 MG SL TABLET    Place 0.4 mg under the tongue every 5 (five) minutes as needed for chest pain.   POLYETHYLENE GLYCOL (MIRALAX / GLYCOLAX) PACKET    Take 17 g by mouth daily.    SKIN PROTECTANTS, MISC. (CALAZIME SKIN PROTECTANT EX)    Apply to buttocks 2 times daily   TRAMADOL (ULTRAM) 50 MG TABLET    Take 50 mg by mouth daily.  Modified Medications   No medications on file  Discontinued Medications   No medications on file     SIGNIFICANT DIAGNOSTIC EXAMS  11-24-14: Myoview: 1. Fixed defects involving the apex, anterior lateral wall and the inferior wall. The apex and anterior lateral wall fixed defects aresuggestive for an infarct. No evidence for reversibility or ischemia. 2. Diffuse hypokinesia and minimal wall motion along the lateral wall. 3. Left ventricular ejection fraction is 35%. 4. High-risk stress test findings*.  04-16-15: 2-d echo: - Left ventricle: The cavity size was mildly dilated. Wall thickness was increased in a pattern of mild LVH. Systolic function was moderately to severely reduced. The estimated ejection fraction was in the range of 30% to 35%. There is akinesis of the anteroseptal and apical myocardium. Features  are consistent with a pseudonormal left ventricular filling pattern, with concomitant abnormal relaxation and increased filling pressure (grade 2 diastolic dysfunction). Doppler parameters are consistent with high ventricular filling pressure. - Mitral valve: Calcified annulus. There was mild regurgitation. - Left atrium: The atrium was mildly dilated. - Pulmonary arteries: Systolic pressure was mildly increased.     LABS REVIEWED: she  will decline labs   11-08-16: wbc 5.5; hgb 9.9; hct 31.2; mcv 99.3; plt 196; glucose 95; bun 71,2; creat 6.55; k+ 5.0; na++ 137; liver normal albumin 4.0; hgb a1c 5.4   04-06-17: wbc 5.1; hgb 11.3; hct 34.2 mcv 96.3; plt 208; tsh 0.8; hgb a1c 5.5 chol 197; ldl 130; trig 51; hdl 57    Review of Systems Unable to perform ROS: Other    Physical Exam Constitutional: She appears well-developed and well-nourished. No distress.  Eyes: Conjunctivae are normal.  Neck: Neck supple. No JVD present. No thyromegaly present.  Cardiovascular: Normal rate, regular rhythm and intact distal pulses.  Murmur   Respiratory: Effort normal and breath sounds normal. No respiratory distress. She has no wheezes.  GI: Soft. Bowel sounds are normal. She exhibits no distension. There is no tenderness.  Musculoskeletal: She exhibits no edema.  Able to move all extremities  Has bilateral  transmetatarsal amputation; Lymphadenopathy:    She has no cervical adenopathy.  Neurological: She is alert.  Skin: Skin is warm and dry. She is not diaphoretic.  Left upper a/v fistula: +thril +bruit    ASSESSMENT/ PLAN:  1. ESRD on hemodialysis: is followed by nephrology. Will continue dialysis three times weekly. Will continue phoslo 667 mg 3 tabs  Twice  daily on dialysis days and three times daily on other days and will monitor   2. Hypertension: 160/82  is presently stable will continue toprol xl 50 mg daily   3. PAD: is without change is status post left and right transmetatarsal  amputation: will continue plavix 75 mg daily  Will continue ultram 50 mg daily for pain management   4. 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 Sustained V tach: not a candidate for ICD. Is on amiodarone 200 mg daily for heart rate control will monitor   6. FTT: her current weight is 154 pounds; her albumin is 4.0. Will continue supplements per facility protocol.   7. Psychosis; paranoid : will continue abilify 10 mg daily . Will monitor   She does not tolerate any changes to her abilify when this occurs she will become more psychotic   8. Constipation: will continue miralax daily    9. Diabetes: hgb a1c 5.5  is presently not on medications; will not make changes will monitor her status.   10. Dyslipidemia: is presently not on medications; ldl 130  11. Anemia: hgb 11.3; will monitor    MD is aware of resident's narcotic use and is in agreement with current plan of care. We will attempt to wean resident as apropriate     Ok Edwards NP [facility]  Contact [phone] Monday through Friday 8am- 5pm  After hours call [phone]

## 2017-07-04 DIAGNOSIS — E1129 Type 2 diabetes mellitus with other diabetic kidney complication: Secondary | ICD-10-CM | POA: Diagnosis not present

## 2017-07-04 DIAGNOSIS — N186 End stage renal disease: Secondary | ICD-10-CM | POA: Diagnosis not present

## 2017-07-04 DIAGNOSIS — D631 Anemia in chronic kidney disease: Secondary | ICD-10-CM | POA: Diagnosis not present

## 2017-07-04 DIAGNOSIS — N2581 Secondary hyperparathyroidism of renal origin: Secondary | ICD-10-CM | POA: Diagnosis not present

## 2017-07-06 DIAGNOSIS — N186 End stage renal disease: Secondary | ICD-10-CM | POA: Diagnosis not present

## 2017-07-06 DIAGNOSIS — D631 Anemia in chronic kidney disease: Secondary | ICD-10-CM | POA: Diagnosis not present

## 2017-07-06 DIAGNOSIS — E1129 Type 2 diabetes mellitus with other diabetic kidney complication: Secondary | ICD-10-CM | POA: Diagnosis not present

## 2017-07-06 DIAGNOSIS — N2581 Secondary hyperparathyroidism of renal origin: Secondary | ICD-10-CM | POA: Diagnosis not present

## 2017-07-09 DIAGNOSIS — D631 Anemia in chronic kidney disease: Secondary | ICD-10-CM | POA: Diagnosis not present

## 2017-07-09 DIAGNOSIS — E1129 Type 2 diabetes mellitus with other diabetic kidney complication: Secondary | ICD-10-CM | POA: Diagnosis not present

## 2017-07-09 DIAGNOSIS — N2581 Secondary hyperparathyroidism of renal origin: Secondary | ICD-10-CM | POA: Diagnosis not present

## 2017-07-09 DIAGNOSIS — N186 End stage renal disease: Secondary | ICD-10-CM | POA: Diagnosis not present

## 2017-07-11 DIAGNOSIS — E1129 Type 2 diabetes mellitus with other diabetic kidney complication: Secondary | ICD-10-CM | POA: Diagnosis not present

## 2017-07-11 DIAGNOSIS — D631 Anemia in chronic kidney disease: Secondary | ICD-10-CM | POA: Diagnosis not present

## 2017-07-11 DIAGNOSIS — N186 End stage renal disease: Secondary | ICD-10-CM | POA: Diagnosis not present

## 2017-07-11 DIAGNOSIS — N2581 Secondary hyperparathyroidism of renal origin: Secondary | ICD-10-CM | POA: Diagnosis not present

## 2017-07-12 ENCOUNTER — Non-Acute Institutional Stay (SKILLED_NURSING_FACILITY): Payer: Medicare Other

## 2017-07-12 ENCOUNTER — Non-Acute Institutional Stay (SKILLED_NURSING_FACILITY): Payer: Medicare Other | Admitting: Internal Medicine

## 2017-07-12 ENCOUNTER — Encounter: Payer: Self-pay | Admitting: Internal Medicine

## 2017-07-12 DIAGNOSIS — N186 End stage renal disease: Secondary | ICD-10-CM | POA: Diagnosis not present

## 2017-07-12 DIAGNOSIS — Z89431 Acquired absence of right foot: Secondary | ICD-10-CM

## 2017-07-12 DIAGNOSIS — E1122 Type 2 diabetes mellitus with diabetic chronic kidney disease: Secondary | ICD-10-CM | POA: Diagnosis not present

## 2017-07-12 DIAGNOSIS — Z0000 Encounter for general adult medical examination without abnormal findings: Secondary | ICD-10-CM

## 2017-07-12 DIAGNOSIS — Z89432 Acquired absence of left foot: Secondary | ICD-10-CM | POA: Diagnosis not present

## 2017-07-12 NOTE — Patient Instructions (Signed)
[name] , Thank you for taking time to come for your Medicare Wellness Visit. I appreciate your ongoing commitment to your health goals. Please review the following plan we discussed and let me know if I can assist you in the future.   Screening recommendations/referrals: Colonoscopy up to date, [place] term pt Mammogram up to date, [place] term pt Bone Density due at age 61 Recommended yearly ophthalmology/optometry visit for glaucoma screening and checkup Recommended yearly dental visit for hygiene and checkup  Vaccinations: Influenza vaccine up to date. Due 09/20/17 Pneumococcal vaccine 13 due Tdap vaccine due Shingles vaccine not in records    Advanced directives: Need a copy for chart  Conditions/risks identified: None  Next appointment: Dr. Eulas Post makes rounds  Preventive Care 40-64 Years, Female Preventive care refers to lifestyle choices and visits with your health care provider that can promote health and wellness. What does preventive care include?  A yearly physical exam. This is also called an annual well check.  Dental exams once or twice a year.  Routine eye exams. Ask your health care provider how often you should have your eyes checked.  Personal lifestyle choices, including:  Daily care of your teeth and gums.  Regular physical activity.  Eating a healthy diet.  Avoiding tobacco and drug use.  Limiting alcohol use.  Practicing safe sex.  Taking low-dose aspirin daily starting at age 4.  Taking vitamin and mineral supplements as recommended by your health care provider. What happens during an annual well check? The services and screenings done by your health care provider during your annual well check will depend on your age, overall health, lifestyle risk factors, and family history of disease. Counseling  Your health care provider may ask you questions about your:  Alcohol use.  Tobacco use.  Drug use.  Emotional well-being.  Home and  relationship well-being.  Sexual activity.  Eating habits.  Work and work Statistician.  Method of birth control.  Menstrual cycle.  Pregnancy history. Screening  You may have the following tests or measurements:  Height, weight, and BMI.  Blood pressure.  Lipid and cholesterol levels. These may be checked every 5 years, or more frequently if you are over 17 years old.  Skin check.  Lung cancer screening. You may have this screening every year starting at age 73 if you have a 30-pack-year history of smoking and currently smoke or have quit within the past 15 years.  Fecal occult blood test (FOBT) of the stool. You may have this test every year starting at age 17.  Flexible sigmoidoscopy or colonoscopy. You may have a sigmoidoscopy every 5 years or a colonoscopy every 10 years starting at age 64.  Hepatitis C blood test.  Hepatitis B blood test.  Sexually transmitted disease (STD) testing.  Diabetes screening. This is done by checking your blood sugar (glucose) after you have not eaten for a while (fasting). You may have this done every 1-3 years.  Mammogram. This may be done every 1-2 years. Talk to your health care provider about when you should start having regular mammograms. This may depend on whether you have a family history of breast cancer.  BRCA-related cancer screening. This may be done if you have a family history of breast, ovarian, tubal, or peritoneal cancers.  Pelvic exam and Pap test. This may be done every 3 years starting at age 68. Starting at age 1, this may be done every 5 years if you have a Pap test in combination with  an HPV test.  Bone density scan. This is done to screen for osteoporosis. You may have this scan if you are at high risk for osteoporosis. Discuss your test results, treatment options, and if necessary, the need for more tests with your health care provider. Vaccines  Your health care provider may recommend certain vaccines, such  as:  Influenza vaccine. This is recommended every year.  Tetanus, diphtheria, and acellular pertussis (Tdap, Td) vaccine. You may need a Td booster every 10 years.  Zoster vaccine. You may need this after age 80.  Pneumococcal 13-valent conjugate (PCV13) vaccine. You may need this if you have certain conditions and were not previously vaccinated.  Pneumococcal polysaccharide (PPSV23) vaccine. You may need one or two doses if you smoke cigarettes or if you have certain conditions. Talk to your health care provider about which screenings and vaccines you need and how often you need them. This information is not intended to replace advice given to you by your health care provider. Make sure you discuss any questions you have with your health care provider. Document Released: 01/07/2016 Document Revised: 08/30/2016 Document Reviewed: 10/12/2015 Elsevier Interactive Patient Education  2017 [place] Prevention in the Home Falls can cause injuries. They can happen to people of all ages. There are many things you can do to make your home safe and to help prevent falls. What can I do on the outside of my home?  Regularly fix the edges of walkways and driveways and fix any cracks.  Remove anything that might make you trip as you walk through a door, such as a raised step or threshold.  Trim any bushes or trees on the path to your home.  Use bright outdoor lighting.  Clear any walking paths of anything that might make someone trip, such as rocks or tools.  Regularly check to see if handrails are loose or broken. Make sure that both sides of any steps have handrails.  Any raised decks and porches should have guardrails on the edges.  Have any leaves, snow, or ice cleared regularly.  Use sand or salt on walking paths during winter.  Clean up any spills in your garage right away. This includes oil or grease spills. What can I do in the bathroom?  Use night  lights.  Install grab bars by the toilet and in the tub and shower. Do not use towel bars as grab bars.  Use non-skid mats or decals in the tub or shower.  If you need to sit down in the shower, use a plastic, non-slip stool.  Keep the floor dry. Clean up any water that spills on the floor as soon as it happens.  Remove soap buildup in the tub or shower regularly.  Attach bath mats securely with double-sided non-slip rug tape.  Do not have throw rugs and other things on the floor that can make you trip. What can I do in the bedroom?  Use night lights.  Make sure that you have a light by your bed that is easy to reach.  Do not use any sheets or blankets that are too big for your bed. They should not hang down onto the floor.  Have a firm chair that has side arms. You can use this for support while you get dressed.  Do not have throw rugs and other things on the floor that can make you trip. What can I do in the kitchen?  Clean up any spills right away.  Avoid walking on wet floors.  Keep items that you use a lot in easy-to-reach places.  If you need to reach something above you, use a strong step stool that has a grab bar.  Keep electrical cords out of the way.  Do not use floor polish or wax that makes floors slippery. If you must use wax, use non-skid floor wax.  Do not have throw rugs and other things on the floor that can make you trip. What can I do with my stairs?  Do not leave any items on the stairs.  Make sure that there are handrails on both sides of the stairs and use them. Fix handrails that are broken or loose. Make sure that handrails are as [place] as the stairways.  Check any carpeting to make sure that it is firmly attached to the stairs. Fix any carpet that is loose or worn.  Avoid having throw rugs at the top or bottom of the stairs. If you do have throw rugs, attach them to the floor with carpet tape.  Make sure that you have a light switch at the  top of the stairs and the bottom of the stairs. If you do not have them, ask someone to add them for you. What else can I do to help prevent falls?  Wear shoes that:  Do not have high heels.  Have rubber bottoms.  Are comfortable and fit you well.  Are closed at the toe. Do not wear sandals.  If you use a stepladder:  Make sure that it is fully opened. Do not climb a closed stepladder.  Make sure that both sides of the stepladder are locked into place.  Ask someone to hold it for you, if possible.  Clearly mark and make sure that you can see:  Any grab bars or handrails.  First and last steps.  Where the edge of each step is.  Use tools that help you move around (mobility aids) if they are needed. These include:  Canes.  Walkers.  Scooters.  Crutches.  Turn on the lights when you go into a dark area. Replace any light bulbs as soon as they burn out.  Set up your furniture so you have a clear path. Avoid moving your furniture around.  If any of your floors are uneven, fix them.  If there are any pets around you, be aware of where they are.  Review your medicines with your doctor. Some medicines can make you feel dizzy. This can increase your chance of falling. Ask your doctor what other things that you can do to help prevent falls. This information is not intended to replace advice given to you by your health care provider. Make sure you discuss any questions you have with your health care provider. Document Released: 10/07/2009 Document Revised: 05/18/2016 Document Reviewed: 01/15/2015 Elsevier Interactive Patient Education  2017 [employer].

## 2017-07-12 NOTE — Progress Notes (Signed)
Patient ID: [name], female   DOB: 08/06/1956, 61 y.o.   MRN: [id number]    DATE:  07/12/2017  Location:    [facility] Room Number: 245 A Place of Service: SNF (31)   Extended Emergency Contact Information Primary Emergency Contact: Gillooly,[name] Address: [place], [place] [address] [place] Phone: [phone] Relation: Spouse Secondary Emergency Contact: Haynes Dage  [place] Phone: [phone] Mobile Phone: [phone] Relation: Sister  Advanced Directive information Does Patient Have a Medical Advance Directive?: Yes, Type of Advance Directive: Out of facility DNR (pink MOST or yellow form) (Full Code), Pre-existing out of facility DNR order (yellow form or pink MOST form): Pink MOST form placed in chart (order not valid for inpatient use), Does patient want to make changes to medical advance directive?: No - Patient declined  Chief Complaint  Patient presents with  . Acute Visit    Diabetic Foot Exam    HPI:  61 yo female [facility] term resident seen today for diabetic foot exam. She needs diabetic foot exam form completed in order to obtain new shoes. She has hx DM with associated PAD s/p b/l TMA, hyperlipidemia and ESRD on HD. She has no c/o today. She is a poor historian due to psych d/o (deaf, mute and psychosis). Hx obtained from chart  ESRD on HD MWF via left arm AVG - followed by nephrology. She takes phoslo 667 mg 3 tabs  Twice daily on dialysis days and three times daily on other days  PAD - stable. She is s/p left and right transmetatarsal amputation; takes plavix 75 mg daily and ultram 50 mg daily for pain management   DM - diet controlled. A1c 5.5%  Dyslipidemia - currently not on statin. LDL 130   Past Medical History:  Diagnosis Date  . Anemia    Due to fibroids.  . Bilateral leg edema    a. Chronic.  Marland Kitchen Blind   . Cardiac arrest ([place]) 12/12/2014  . CHF (congestive heart  failure) ([place])   . Coronary artery disease   . Deaf    USE SIGN INTERPRETER.  Divorced from husband but lives with him. Has daughter but she does not care for her.  . Diabetes mellitus    a. Per PCP note 2012 (A1C 9.2) - pt unwilling to take meds and was educated on risk of uncontrolled DM.  b. A1C 5.9 in 11/2013.  Marland Kitchen ESRD on hemodialysis ([place])    Mon, Wed, Fri  . Essential hypertension 08/09/2009   Qualifier: Diagnosis of  By: Sarita Haver  MD, Coralyn Mark    . Fibroid uterus   . Heart murmur   . Hyperlipidemia   . Hypertension    a. Has previously refused blood pressure meds.   . Leiomyoma of uterus 08/09/2009   Qualifier: Diagnosis of  By: Sarita Haver  MD, Coralyn Mark    . MI (myocardial infarction) ([place]) 11/2014  . Moderate tricuspid regurgitation   . Osteomyelitis ([place])   . Psychiatric disorder    She frequently exhibits paranoia and has been diagnosed with psychotic d/o NOS during hospital stay in the past. She is tangetial and perseverative during her visits. She apparently has had a bad experience with mental health in [place] in the past and refuses to discuss mental issues for fear that she will be sent back there. Her paranoia, communication issues, financial woes and lack of fam  . Pulmonary hypertension (  [facility])     Past Surgical History:  Procedure Laterality Date  . AMPUTATION Bilateral 04/02/2015   Procedure: Amputation Bilateral Great Toes MTP Joint;  Surgeon: Newt Minion, MD;  Location: [facility];  Service: Orthopedics;  Laterality: Bilateral;  . AMPUTATION Left 05/14/2015   Procedure: Left Transmetatarsal Amputation;  Surgeon: Newt Minion, MD;  Location: [facility];  Service: Orthopedics;  Laterality: Left;  . AMPUTATION Right 08/20/2015   Procedure: Transmetatarsal Amputation Right Foot;  Surgeon: Newt Minion, MD;  Location: [facility];  Service: Orthopedics;  Laterality: Right;  . AV FISTULA PLACEMENT Left 01/02/2014   Procedure: ARTERIOVENOUS (AV) FISTULA CREATION;  Surgeon: Rosetta Posner, MD;  Location:  [facility];  Service: Vascular;  Laterality: Left;  . INSERTION OF DIALYSIS CATHETER Right 01/02/2014   Procedure: INSERTION OF DIALYSIS CATHETER;  Surgeon: Rosetta Posner, MD;  Location: [facility];  Service: Vascular;  Laterality: Right;  . NO PAST SURGERIES    . ORIF PATELLA Left 05/17/2015   Procedure: OPEN REDUCTION INTERNAL (ORIF) FIXATION PATELLA;  Surgeon: Newt Minion, MD;  Location: [place] [facility];  Service: Orthopedics;  Laterality: Left;    Patient Care Team: [name], DO as PCP - General (Internal Medicine) Nyoka Cowden Phylis Bougie, NP as Nurse Practitioner ([place]) [facility], [place] ([place])  Social History   Social History  . Marital status: Widowed    Spouse name: N/A  . Number of children: N/A  . Years of education: N/A   Occupational History  . Not on file.   Social History Main Topics  . Smoking status: Never Smoker  . Smokeless tobacco: Never Used     Comment: Prior dip  . Alcohol use No  . Drug use: No  . Sexual activity: Not on file   Other Topics Concern  . Not on file   Social History Narrative  . No narrative on file     reports that she has never smoked. She has never used smokeless tobacco. She reports that she does not drink alcohol or use drugs.  Family History  Problem Relation Age of Onset  . Other Father        Drowned from fishing?  . Heart disease Unknown    Family Status  Relation Status  . Mother Deceased       Unknown  . Father Deceased  . Unknown (Not Specified)    Immunization History  Administered Date(s) Administered  . Influenza,inj,Quad PF,36+ Mos 01/03/2014  . Influenza-Unspecified 09/24/2014, 11/13/2014, 02/01/2016, 09/20/2016  . PPD Test 07/28/2014, 09/15/2016  . Pneumococcal Polysaccharide-23 01/03/2014    No Known Allergies  Medications: Patient's Medications  New Prescriptions   No medications on file  Previous Medications   AMINO ACIDS-PROTEIN HYDROLYS (FEEDING SUPPLEMENT, PRO-STAT  SUGAR FREE 64,) LIQD    Take 60 mLs by mouth 2 (two) times daily.   AMIODARONE (PACERONE) 200 MG TABLET    Take 200 mg by mouth every evening.    ARIPIPRAZOLE (ABILIFY) 10 MG TABLET    Take 10 mg by mouth at bedtime.    CALCIUM ACETATE (PHOSLO) 667 MG TABLET    Take 3 tablets by mouth before meals every Sunday, Tuesday, Thursday and Saturday for Dialysis.  Give 3 tablets by mouth two times a day every Mon, Wed, Friday for renal buffer   CLOPIDOGREL (PLAVIX) 75 MG TABLET    Take 75 mg by mouth every evening.   METOPROLOL SUCCINATE (TOPROL-XL) 50 MG 24 HR TABLET  Take 50 mg by mouth every evening. Take with or immediately following a meal.   NITROGLYCERIN (NITROSTAT) 0.4 MG SL TABLET    Place 0.4 mg under the tongue every 5 (five) minutes as needed for chest pain.   POLYETHYLENE GLYCOL (MIRALAX / GLYCOLAX) PACKET    Take 17 g by mouth daily.    SKIN PROTECTANTS, MISC. (CALAZIME SKIN PROTECTANT EX)    Apply to buttocks 2 times daily   TRAMADOL (ULTRAM) 50 MG TABLET    Take 50 mg by mouth daily.  Modified Medications   No medications on file  Discontinued Medications   No medications on file    Review of Systems  Unable to perform ROS: Psychiatric disorder    Vitals:   07/12/17 1227  BP: 121/82  Pulse: 72  Resp: 17  Temp: (!) 97.4 F (36.3 C)  TempSrc: Oral  SpO2: 99%  Weight: 154 lb 9.6 oz (70.1 kg)  Height: 5\' 5"  (1.651 m)   Body mass index is 25.73 kg/m.  Physical Exam  Constitutional: She appears well-developed and well-nourished.  Sitting in w/c in NAD  Cardiovascular:  Trace LE edema b/l. No calf TTP  Musculoskeletal: She exhibits deformity (b/l TMA).  Neurological: She is alert.  Skin: Skin is warm and dry. No rash noted.  Psychiatric: She has a normal mood and affect. Her behavior is normal.   Diabetic Foot Exam - Simple   Simple Foot Form Diabetic Foot exam was performed with the following findings:  Yes 07/12/2017  1:46 PM  Visual Inspection See comments:   Yes Sensation Testing See comments:  Yes Pulse Check Posterior Tibialis and Dorsalis pulse intact bilaterally:  Yes Comments B/l TMA with trace LE edema b/l. No foot lesions. No calluses/ulcerations. Unable to perform monofilament 2/2 TMA b/l       Labs reviewed: No visits with results within 3 Month(s) from this visit.  Latest known visit with results is:  Nursing Home on 03/06/2017  Component Date Value Ref Range Status  . Hemoglobin 11/08/2016 9.9* 12.0 - 16.0 g/dL Final  . HCT 11/08/2016 31* 36 - 46 % Final  . Neutrophils Absolute 11/08/2016 2  /L Final  . Platelets 11/08/2016 196  150 - 399 K/L Final  . WBC 11/08/2016 5.5  10^3/mL Final  . Glucose 11/08/2016 95  mg/dL Final  . BUN 11/08/2016 71* 4 - 21 mg/dL Final  . Creatinine 11/08/2016 6.6* 0.5 - 1.1 mg/dL Final  . Potassium 11/08/2016 5.0  3.4 - 5.3 mmol/L Final  . Sodium 11/08/2016 137  137 - 147 mmol/L Final  . Triglycerides 11/08/2016 37* 40 - 160 mg/dL Final  . Cholesterol 11/08/2016 182  0 - 200 mg/dL Final  . HDL 11/08/2016 57  35 - 70 mg/dL Final  . LDL Cholesterol 11/08/2016 117  mg/dL Final  . Alkaline Phosphatase 11/08/2016 59  25 - 125 U/L Final  . ALT 11/08/2016 16  7 - 35 U/L Final  . AST 11/08/2016 12* 13 - 35 U/L Final  . Bilirubin, Total 11/08/2016 0.3  mg/dL Final    No results found.   Assessment/Plan   ICD-10-CM   1. Status post transmetatarsal amputation of left foot ([place]) Z89.432   2. S/P transmetatarsal amputation of foot, right ([place]) Z89.431   3. Diabetes mellitus with ESRD (end-stage renal disease) ([place]) E11.22    N18.6    Diabetic foot exam form completed  Cont current meds as ordered  Will follow  Devlon Dosher S. Rae Lips.,  [facility] [address] [place], [place] [address] [phone] Cell (Monday-Friday 8 AM - 5 PM) [phone] After 5 PM and follow prompts

## 2017-07-12 NOTE — Progress Notes (Signed)
Subjective:   [name] is a 61 y.o. female who presents for an Initial Medicare Annual Wellness Visit at [facility]; incapacitated patient unable to answer questions appropriately      Objective:    Today's Vitals   07/12/17 1418  BP: (!) 162/70  Pulse: 66  Temp: 97.9 F (36.6 C)  TempSrc: Oral  SpO2: 98%  Weight: 153 lb (69.4 kg)  Height: 5\' 5"  (1.651 m)   Body mass index is 25.46 kg/m.   Current Medications (verified) Outpatient Encounter Prescriptions as of 07/12/2017  Medication Sig  . Amino Acids-Protein Hydrolys (FEEDING SUPPLEMENT, PRO-STAT SUGAR FREE 64,) LIQD Take 60 mLs by mouth 2 (two) times daily.  Marland Kitchen amiodarone (PACERONE) 200 MG tablet Take 200 mg by mouth every evening.   . ARIPiprazole (ABILIFY) 10 MG tablet Take 10 mg by mouth at bedtime.   . calcium acetate (PHOSLO) 667 MG tablet Take 3 tablets by mouth before meals every Sunday, Tuesday, Thursday and Saturday for Dialysis.  Give 3 tablets by mouth two times a day every Mon, Wed, Friday for renal buffer  . clopidogrel (PLAVIX) 75 MG tablet Take 75 mg by mouth every evening.  . metoprolol succinate (TOPROL-XL) 50 MG 24 hr tablet Take 50 mg by mouth every evening. Take with or immediately following a meal.  . nitroGLYCERIN (NITROSTAT) 0.4 MG SL tablet Place 0.4 mg under the tongue every 5 (five) minutes as needed for chest pain.  . polyethylene glycol (MIRALAX / GLYCOLAX) packet Take 17 g by mouth daily.   . Skin Protectants, Misc. (CALAZIME SKIN PROTECTANT EX) Apply to buttocks 2 times daily  . traMADol (ULTRAM) 50 MG tablet Take 50 mg by mouth daily.   No facility-administered encounter medications on file as of 07/12/2017.     Allergies (verified) Patient has no known allergies.   History: Past Medical History:  Diagnosis Date  . Anemia    Due to fibroids.  . Bilateral leg edema    a. Chronic.  Marland Kitchen Blind   . Cardiac arrest ([place]) 12/12/2014  . CHF (congestive heart failure) ([place])     . Coronary artery disease   . Deaf    USE SIGN INTERPRETER.  Divorced from husband but lives with him. Has daughter but she does not care for her.  . Diabetes mellitus    a. Per PCP note 2012 (A1C 9.2) - pt unwilling to take meds and was educated on risk of uncontrolled DM.  b. A1C 5.9 in 11/2013.  Marland Kitchen ESRD on hemodialysis ([place])    Mon, Wed, Fri  . Essential hypertension 08/09/2009   Qualifier: Diagnosis of  By: Sarita Haver  MD, Coralyn Mark    . Fibroid uterus   . Heart murmur   . Hyperlipidemia   . Hypertension    a. Has previously refused blood pressure meds.   . Leiomyoma of uterus 08/09/2009   Qualifier: Diagnosis of  By: Sarita Haver  MD, Coralyn Mark    . MI (myocardial infarction) ([place]) 11/2014  . Moderate tricuspid regurgitation   . Osteomyelitis ([place])   . Psychiatric disorder    She frequently exhibits paranoia and has been diagnosed with psychotic d/o NOS during hospital stay in the past. She is tangetial and perseverative during her visits. She apparently has had a bad experience with mental health in [place] in the past and refuses to discuss mental issues for fear that she will be sent back there. Her paranoia, communication issues, financial woes and lack of fam  .  Pulmonary hypertension ([place])    Past Surgical History:  Procedure Laterality Date  . AMPUTATION Bilateral 04/02/2015   Procedure: Amputation Bilateral Great Toes MTP Joint;  Surgeon: Newt Minion, MD;  Location: [facility];  Service: Orthopedics;  Laterality: Bilateral;  . AMPUTATION Left 05/14/2015   Procedure: Left Transmetatarsal Amputation;  Surgeon: Newt Minion, MD;  Location: [facility];  Service: Orthopedics;  Laterality: Left;  . AMPUTATION Right 08/20/2015   Procedure: Transmetatarsal Amputation Right Foot;  Surgeon: Newt Minion, MD;  Location: [facility];  Service: Orthopedics;  Laterality: Right;  . AV FISTULA PLACEMENT Left 01/02/2014   Procedure: ARTERIOVENOUS (AV) FISTULA CREATION;  Surgeon: Rosetta Posner, MD;  Location: [facility];  Service:  Vascular;  Laterality: Left;  . INSERTION OF DIALYSIS CATHETER Right 01/02/2014   Procedure: INSERTION OF DIALYSIS CATHETER;  Surgeon: Rosetta Posner, MD;  Location: [facility];  Service: Vascular;  Laterality: Right;  . NO PAST SURGERIES    . ORIF PATELLA Left 05/17/2015   Procedure: OPEN REDUCTION INTERNAL (ORIF) FIXATION PATELLA;  Surgeon: Newt Minion, MD;  Location: [facility];  Service: Orthopedics;  Laterality: Left;   Family History  Problem Relation Age of Onset  . Other Father        Drowned from fishing?  . Heart disease Unknown    Social History   Occupational History  . Not on file.   Social History Main Topics  . Smoking status: Never Smoker  . Smokeless tobacco: Never Used     Comment: Prior dip  . Alcohol use No  . Drug use: No  . Sexual activity: Not on file    Tobacco Counseling Counseling given: Not Answered   Activities of Daily Living In your present state of health, do you have any difficulty performing the following activities: 07/12/2017  Hearing? Y  Vision? Y  Difficulty concentrating or making decisions? N  Walking or climbing stairs? Y  Dressing or bathing? Y  Doing errands, shopping? Y  Preparing Food and eating ? Y  Using the Toilet? Y  In the past six months, have you accidently leaked urine? Y  Do you have problems with loss of bowel control? Y  Managing your Medications? Y  Managing your Finances? Y  Housekeeping or managing your Housekeeping? Y  Some recent data might be hidden    Immunizations and Health Maintenance Immunization History  Administered Date(s) Administered  . Influenza,inj,Quad PF,36+ Mos 01/03/2014  . Influenza-Unspecified 09/24/2014, 11/13/2014, 02/01/2016, 09/20/2016  . PPD Test 07/28/2014, 09/15/2016  . Pneumococcal Polysaccharide-23 01/03/2014   There are no preventive care reminders to display for this patient.  Patient Care Team: [name], DO as PCP - General (Internal Medicine) Nyoka Cowden Phylis Bougie, NP as Nurse  Practitioner ([place]) [facility], [place] ([place])  Indicate any recent [place] you may have received from other than [facility] providers in the past year (date may be approximate).     Assessment:   This is a routine wellness examination for Lerin.   Hearing/Vision screen No exam data present  Dietary issues and exercise activities discussed: Current Exercise Habits: The patient does not participate in regular exercise at present, Exercise limited by: Other - see comments (blind and deaf)  Goals    None     Depression Screen PHQ 2/9 Scores 07/12/2017  Exception Documentation Other- indicate reason in comment box    Fall Risk Fall Risk  07/12/2017  Falls in the past year? No  Cognitive Function: MMSE - Mini Mental State Exam 07/12/2017  Not completed: Unable to complete        Screening Tests Health Maintenance  Topic Date Due  . HEMOGLOBIN A1C  05/08/2018 (Originally 05/08/2017)  . PNEUMOCOCCAL POLYSACCHARIDE VACCINE (2) 01/03/2019  . HIV Screening  Completed      Plan:    I have personally reviewed and addressed the Medicare Annual Wellness questionnaire and have noted the following in the patient's chart:  A. Medical and social history B. Use of alcohol, tobacco or illicit drugs  C. Current medications and supplements D. Functional ability and status E.  Nutritional status F.  Physical activity G. Advance directives H. List of other physicians I.  Hospitalizations, surgeries, and ER visits in previous 12 months J.  [place] to include hearing, vision, cognitive, depression L. Referrals and appointments - none  In addition, unable to review and discuss with incapacitated patient certain preventive protocols, quality metrics, and best practice recommendations. A written personalized care plan for preventive services as well as general preventive health recommendations were provided to patient.   See  attached scanned questionnaire for additional information.   Signed,   Rich Reining, RN Nurse Health Advisor   Quick Notes   Health Maintenance:  PNA 13, TDAP due     Abnormal Screen: Unable to complete mental exam     Patient Concerns: None     Nurse Concerns: None

## 2017-07-13 DIAGNOSIS — D631 Anemia in chronic kidney disease: Secondary | ICD-10-CM | POA: Diagnosis not present

## 2017-07-13 DIAGNOSIS — N2581 Secondary hyperparathyroidism of renal origin: Secondary | ICD-10-CM | POA: Diagnosis not present

## 2017-07-13 DIAGNOSIS — E1129 Type 2 diabetes mellitus with other diabetic kidney complication: Secondary | ICD-10-CM | POA: Diagnosis not present

## 2017-07-13 DIAGNOSIS — N186 End stage renal disease: Secondary | ICD-10-CM | POA: Diagnosis not present

## 2017-07-16 DIAGNOSIS — N2581 Secondary hyperparathyroidism of renal origin: Secondary | ICD-10-CM | POA: Diagnosis not present

## 2017-07-16 DIAGNOSIS — D631 Anemia in chronic kidney disease: Secondary | ICD-10-CM | POA: Diagnosis not present

## 2017-07-16 DIAGNOSIS — N186 End stage renal disease: Secondary | ICD-10-CM | POA: Diagnosis not present

## 2017-07-16 DIAGNOSIS — E1129 Type 2 diabetes mellitus with other diabetic kidney complication: Secondary | ICD-10-CM | POA: Diagnosis not present

## 2017-07-18 DIAGNOSIS — E1129 Type 2 diabetes mellitus with other diabetic kidney complication: Secondary | ICD-10-CM | POA: Diagnosis not present

## 2017-07-18 DIAGNOSIS — D631 Anemia in chronic kidney disease: Secondary | ICD-10-CM | POA: Diagnosis not present

## 2017-07-18 DIAGNOSIS — N186 End stage renal disease: Secondary | ICD-10-CM | POA: Diagnosis not present

## 2017-07-18 DIAGNOSIS — N2581 Secondary hyperparathyroidism of renal origin: Secondary | ICD-10-CM | POA: Diagnosis not present

## 2017-07-20 DIAGNOSIS — N2581 Secondary hyperparathyroidism of renal origin: Secondary | ICD-10-CM | POA: Diagnosis not present

## 2017-07-20 DIAGNOSIS — E1129 Type 2 diabetes mellitus with other diabetic kidney complication: Secondary | ICD-10-CM | POA: Diagnosis not present

## 2017-07-20 DIAGNOSIS — N186 End stage renal disease: Secondary | ICD-10-CM | POA: Diagnosis not present

## 2017-07-20 DIAGNOSIS — D631 Anemia in chronic kidney disease: Secondary | ICD-10-CM | POA: Diagnosis not present

## 2017-07-23 DIAGNOSIS — N2581 Secondary hyperparathyroidism of renal origin: Secondary | ICD-10-CM | POA: Diagnosis not present

## 2017-07-23 DIAGNOSIS — N186 End stage renal disease: Secondary | ICD-10-CM | POA: Diagnosis not present

## 2017-07-23 DIAGNOSIS — E1129 Type 2 diabetes mellitus with other diabetic kidney complication: Secondary | ICD-10-CM | POA: Diagnosis not present

## 2017-07-23 DIAGNOSIS — D631 Anemia in chronic kidney disease: Secondary | ICD-10-CM | POA: Diagnosis not present

## 2017-07-24 DIAGNOSIS — N186 End stage renal disease: Secondary | ICD-10-CM | POA: Diagnosis not present

## 2017-07-24 DIAGNOSIS — Z992 Dependence on renal dialysis: Secondary | ICD-10-CM | POA: Diagnosis not present

## 2017-07-24 DIAGNOSIS — E1129 Type 2 diabetes mellitus with other diabetic kidney complication: Secondary | ICD-10-CM | POA: Diagnosis not present

## 2017-07-25 DIAGNOSIS — E1129 Type 2 diabetes mellitus with other diabetic kidney complication: Secondary | ICD-10-CM | POA: Diagnosis not present

## 2017-07-25 DIAGNOSIS — D631 Anemia in chronic kidney disease: Secondary | ICD-10-CM | POA: Diagnosis not present

## 2017-07-25 DIAGNOSIS — N2581 Secondary hyperparathyroidism of renal origin: Secondary | ICD-10-CM | POA: Diagnosis not present

## 2017-07-25 DIAGNOSIS — N186 End stage renal disease: Secondary | ICD-10-CM | POA: Diagnosis not present

## 2017-07-27 DIAGNOSIS — D631 Anemia in chronic kidney disease: Secondary | ICD-10-CM | POA: Diagnosis not present

## 2017-07-27 DIAGNOSIS — N2581 Secondary hyperparathyroidism of renal origin: Secondary | ICD-10-CM | POA: Diagnosis not present

## 2017-07-27 DIAGNOSIS — E1129 Type 2 diabetes mellitus with other diabetic kidney complication: Secondary | ICD-10-CM | POA: Diagnosis not present

## 2017-07-27 DIAGNOSIS — N186 End stage renal disease: Secondary | ICD-10-CM | POA: Diagnosis not present

## 2017-07-30 ENCOUNTER — Encounter: Payer: Self-pay | Admitting: Internal Medicine

## 2017-07-30 ENCOUNTER — Non-Acute Institutional Stay (SKILLED_NURSING_FACILITY): Payer: Medicare Other | Admitting: Internal Medicine

## 2017-07-30 DIAGNOSIS — E1122 Type 2 diabetes mellitus with diabetic chronic kidney disease: Secondary | ICD-10-CM

## 2017-07-30 DIAGNOSIS — H547 Unspecified visual loss: Secondary | ICD-10-CM

## 2017-07-30 DIAGNOSIS — H9190 Unspecified hearing loss, unspecified ear: Secondary | ICD-10-CM | POA: Diagnosis not present

## 2017-07-30 DIAGNOSIS — I5042 Chronic combined systolic (congestive) and diastolic (congestive) heart failure: Secondary | ICD-10-CM | POA: Diagnosis not present

## 2017-07-30 DIAGNOSIS — N186 End stage renal disease: Secondary | ICD-10-CM

## 2017-07-30 DIAGNOSIS — N2581 Secondary hyperparathyroidism of renal origin: Secondary | ICD-10-CM | POA: Diagnosis not present

## 2017-07-30 DIAGNOSIS — E1169 Type 2 diabetes mellitus with other specified complication: Secondary | ICD-10-CM

## 2017-07-30 DIAGNOSIS — R627 Adult failure to thrive: Secondary | ICD-10-CM | POA: Diagnosis not present

## 2017-07-30 DIAGNOSIS — E785 Hyperlipidemia, unspecified: Secondary | ICD-10-CM

## 2017-07-30 DIAGNOSIS — I739 Peripheral vascular disease, unspecified: Secondary | ICD-10-CM

## 2017-07-30 DIAGNOSIS — E1129 Type 2 diabetes mellitus with other diabetic kidney complication: Secondary | ICD-10-CM | POA: Diagnosis not present

## 2017-07-30 DIAGNOSIS — D631 Anemia in chronic kidney disease: Secondary | ICD-10-CM | POA: Diagnosis not present

## 2017-07-30 NOTE — Progress Notes (Signed)
Patient ID: [name], female   DOB: [birth year], 61 y.o.   MRN: [id number]    DATE:   July 30, 2017  Location:   [facility] Room Number: 202 A Place of Service: SNF (31)   Extended Emergency Contact Information Primary Emergency Contact: [name] Address: [place], [place] [address] [place] Phone: [phone] Relation: Spouse Secondary Emergency Contact: [name]  [place] Phone: [phone] Mobile Phone: [phone] Relation: Sister  Advanced Directive information Does Patient Have a Medical Advance Directive?: Yes, Type of Advance Directive: Out of facility DNR (pink MOST or yellow form), Pre-existing out of facility DNR order (yellow form or pink MOST form): Pink MOST form placed in chart (order not valid for inpatient use), Does patient want to make changes to medical advance directive?: No - Patient declined  Chief Complaint  Patient presents with  . Medical Management of Chronic Issues    1 month follow up    HPI:  61 yo female [facility] term resident seen today for f/u. She has no concerns today. No nursing issues. No recent falls. Appetite is excellent. Sleeps well. Pain controlled. She is a poor historian due to nonverbal/mute. Hx obtained from chart. She had AWV on 07/12/17. Notes reviewed.   ESRD - on HD MWF via left arm AVF; followed by nephrology. She takes phoslo 667 mg 3 tabs BID on MWF and TID on other days.  PAD - stable. She is s/p left and right transmetatarsal amputation; takes plavix 75 mg daily and ultram 50 mg daily for pain management   DM - diet controlled. A1c 5.5%  Dyslipidemia - currently not on statin. 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 Rate controlled on  amiodarone 200 mg daily  FTT -  albumin 4.0. She gets nutritional supplements per facility protocol.   Psychosis, paranoid  - mood stable on abilify 10 mg daily; she does not tolerate any changes to her abilify and when this occurs she will become more psychotic   Constipation - stable on miralax daily    Dyslipidemia - diet controlled. LDL 130  Hx Anemia - stable. Hgb 11.3   Past Medical History:  Diagnosis Date  . Anemia    Due to fibroids.  . Bilateral leg edema    a. Chronic.  Marland Kitchen Blind   . Cardiac arrest ([place]) 12/12/2014  . CHF (congestive heart failure) ([place])   . Coronary artery disease   . Deaf    USE SIGN INTERPRETER.  Divorced from husband but lives with him. Has daughter but she does not care for her.  . Diabetes mellitus    a. Per PCP note 2012 (A1C 9.2) - pt unwilling to take meds and was educated on risk of uncontrolled DM.  b. A1C 5.9 in 11/2013.  Marland Kitchen ESRD on hemodialysis ([place])    Mon, Wed, Fri  . Essential hypertension 08/09/2009   Qualifier: Diagnosis of  By: Sarita Haver  MD, Coralyn Mark    . Fibroid uterus   . Heart murmur   . Hyperlipidemia   . Hypertension    a. Has previously refused blood pressure meds.   . Leiomyoma of uterus 08/09/2009   Qualifier: Diagnosis of  BySarita Haver  MD, Coralyn Mark    . MI (myocardial infarction) ([facility]) 11/2014  . Moderate tricuspid regurgitation   . Osteomyelitis ([place])   . Psychiatric disorder    She frequently exhibits paranoia and has been diagnosed with psychotic d/o NOS during hospital stay in the past. She is tangetial and perseverative during her visits. She apparently has had a bad experience with mental health in [place] in the past and refuses to discuss mental issues for fear that she will be sent back there. Her paranoia, communication issues, financial woes and lack of fam  . Pulmonary hypertension ([place])     Past Surgical History:  Procedure Laterality Date  . AMPUTATION Bilateral 04/02/2015   Procedure: Amputation Bilateral Great  Toes MTP Joint;  Surgeon: Newt Minion, MD;  Location: [facility];  Service: Orthopedics;  Laterality: Bilateral;  . AMPUTATION Left 05/14/2015   Procedure: Left Transmetatarsal Amputation;  Surgeon: Newt Minion, MD;  Location: [facility];  Service: Orthopedics;  Laterality: Left;  . AMPUTATION Right 08/20/2015   Procedure: Transmetatarsal Amputation Right Foot;  Surgeon: Newt Minion, MD;  Location: [facility];  Service: Orthopedics;  Laterality: Right;  . AV FISTULA PLACEMENT Left 01/02/2014   Procedure: ARTERIOVENOUS (AV) FISTULA CREATION;  Surgeon: Rosetta Posner, MD;  Location: [facility];  Service: Vascular;  Laterality: Left;  . INSERTION OF DIALYSIS CATHETER Right 01/02/2014   Procedure: INSERTION OF DIALYSIS CATHETER;  Surgeon: Rosetta Posner, MD;  Location: [facility];  Service: Vascular;  Laterality: Right;  . NO PAST SURGERIES    . ORIF PATELLA Left 05/17/2015   Procedure: OPEN REDUCTION INTERNAL (ORIF) FIXATION PATELLA;  Surgeon: Newt Minion, MD;  Location: [facility];  Service: Orthopedics;  Laterality: Left;    Patient Care Team: [name], DO as PCP - General (Internal Medicine) Nyoka Cowden Phylis Bougie, NP as Nurse Practitioner ([facility]) [facility], [place] ([place])  Social History   Social History  . Marital status: Widowed    Spouse name: N/A  . Number of children: N/A  . Years of education: N/A   Occupational History  . Not on file.   Social History Main Topics  . Smoking status: Never Smoker  . Smokeless tobacco: Never Used     Comment: Prior dip  . Alcohol use No  . Drug use: No  . Sexual activity: Not on file   Other Topics Concern  . Not on file   Social History Narrative  . No narrative on file     reports that she has never smoked. She has never used smokeless tobacco. She reports that she does not drink alcohol or use drugs.  Family History  Problem Relation Age of Onset  . Other Father        Drowned from fishing?  . Heart disease Unknown      Family Status  Relation Status  . Mother Deceased       Unknown  . Father Deceased  . Unknown (Not Specified)    Immunization History  Administered Date(s) Administered  . Influenza,inj,Quad PF,36+ Mos 01/03/2014  . Influenza-Unspecified 09/24/2014, 11/13/2014, 02/01/2016, 09/20/2016  . PPD Test 07/28/2014, 09/15/2016  . Pneumococcal Polysaccharide-23 01/03/2014    No Known Allergies  Medications: Patient's Medications  New Prescriptions   No medications on file  Previous Medications   AMINO ACIDS-PROTEIN HYDROLYS (FEEDING SUPPLEMENT, PRO-STAT SUGAR FREE 64,) LIQD    Take 60 mLs by mouth 2 (two) times daily.   AMIODARONE (PACERONE) 200  MG TABLET    Take 200 mg by mouth every evening.    ARIPIPRAZOLE (ABILIFY) 10 MG TABLET    Take 10 mg by mouth at bedtime.    CALCIUM ACETATE (PHOSLO) 667 MG TABLET    Take 3 tablets by mouth before meals every Sunday, Tuesday, Thursday and Saturday for Dialysis.  Give 3 tablets by mouth two times a day every Mon, Wed, Friday for renal buffer   CLOPIDOGREL (PLAVIX) 75 MG TABLET    Take 75 mg by mouth every evening.   METOPROLOL SUCCINATE (TOPROL-XL) 50 MG 24 HR TABLET    Take 50 mg by mouth every evening. Take with or immediately following a meal.   NITROGLYCERIN (NITROSTAT) 0.4 MG SL TABLET    Place 0.4 mg under the tongue every 5 (five) minutes as needed for chest pain.   POLYETHYLENE GLYCOL (MIRALAX / GLYCOLAX) PACKET    Take 17 g by mouth daily.    SKIN PROTECTANTS, MISC. (CALAZIME SKIN PROTECTANT EX)    Apply to buttocks 2 times daily   TRAMADOL (ULTRAM) 50 MG TABLET    Take 50 mg by mouth daily.  Modified Medications   No medications on file  Discontinued Medications   No medications on file    Review of Systems  Unable to perform ROS: Other (mute; nonverbal)    Vitals:   07/30/17 1124  BP: (!) 142/76  Pulse: 70  Resp: 20  Temp: 97.8 F (36.6 C)  SpO2: 97%  Weight: 154 lb 9.6 oz (70.1 kg)  Height: 5\' 5"  (1.651 m)   Body  mass index is 25.73 kg/m.  Physical Exam  Constitutional: She appears well-developed and well-nourished.  Sitting in w/c in NAD  HENT:  Mouth/Throat: Oropharynx is clear and moist. No oropharyngeal exudate.  MMM; no oral thrush  Eyes: No scleral icterus.  OU lens clouding  Neck: Neck supple. Carotid bruit is not present. No tracheal deviation present.  Cardiovascular: Normal rate, regular rhythm and intact distal pulses.  Exam reveals no gallop and no friction rub.   Murmur (1/6 SEM) heard. Trace LE edema b/l; no calf TTP; left arm AVF with palpable thrill/audible bruit  Pulmonary/Chest: Effort normal and breath sounds normal. No stridor. No respiratory distress. She has no wheezes. She has no rales.  Reduced BS at left base  Abdominal: Soft. Normal appearance and bowel sounds are normal. She exhibits no distension and no mass. There is no hepatomegaly. There is no tenderness. There is no rigidity, no rebound and no guarding. No hernia.  Musculoskeletal: She exhibits deformity (b/l TMA).  Lymphadenopathy:    She has no cervical adenopathy.  Neurological: She is alert.  Skin: Skin is warm and dry. No rash noted.  Psychiatric: She has a normal mood and affect. Her behavior is normal.     Labs reviewed: No visits with results within 3 Month(s) from this visit.  Latest known visit with results is:  Nursing Home on 03/06/2017  Component Date Value Ref Range Status  . Hemoglobin 11/08/2016 9.9* 12.0 - 16.0 g/dL Final  . HCT 11/08/2016 31* 36 - 46 % Final  . Neutrophils Absolute 11/08/2016 2  /L Final  . Platelets 11/08/2016 196  150 - 399 K/L Final  . WBC 11/08/2016 5.5  10^3/mL Final  . Glucose 11/08/2016 95  mg/dL Final  . BUN 11/08/2016 71* 4 - 21 mg/dL Final  . Creatinine 11/08/2016 6.6* 0.5 - 1.1 mg/dL Final  . Potassium 11/08/2016 5.0  3.4 - 5.3  mmol/L Final  . Sodium 11/08/2016 137  137 - 147 mmol/L Final  . Triglycerides 11/08/2016 37* 40 - 160 mg/dL Final  .  Cholesterol 11/08/2016 182  0 - 200 mg/dL Final  . HDL 11/08/2016 57  35 - 70 mg/dL Final  . LDL Cholesterol 11/08/2016 117  mg/dL Final  . Alkaline Phosphatase 11/08/2016 59  25 - 125 U/L Final  . ALT 11/08/2016 16  7 - 35 U/L Final  . AST 11/08/2016 12* 13 - 35 U/L Final  . Bilirubin, Total 11/08/2016 0.3  mg/dL Final    No results found.   Assessment/Plan   ICD-10-CM   1. Diabetes mellitus with ESRD (end-stage renal disease) ([facility]) E11.22    N18.6   2. Chronic combined systolic and diastolic congestive heart failure (HCC) I50.42   3. Dyslipidemia associated with type 2 diabetes mellitus (HCC) E11.69    E78.5   4. PAD (peripheral artery disease) (HCC) I73.9    s/p b/l TMA  5. FTT (failure to thrive) in adult R62.7   6. Blindness with deafness H54.7    H91.90      Check A1c, lipid panel  Cont current meds as ordered  Get recent labs from HD  Cont nutritional supplements as ordered  PT/OT/ST as indicated  F/u with HD as scheduled  F/u with nephrology as scheduled  Will follow  Lon Klippel S. Perlie Gold  [facility] and Adult Medicine [address] [place], [place] [address] [phone] Cell (Monday-Friday 8 AM - 5 PM) [phone] After 5 PM and follow prompts

## 2017-07-30 NOTE — Progress Notes (Deleted)
DATE:  July 30, 2017  Location:   [facility] Room Number: 202 A Place of Service: SNF (31)   Extended Emergency Contact Information Primary Emergency Contact: Schiele,[name] Address: [place], [place] [address] [place] Phone: [phone] Relation: Spouse Secondary Emergency Contact: Haynes Dage  [place] Phone: [phone] Mobile Phone: [phone] Relation: Sister  Advanced Directive information Does Patient Have a Medical Advance Directive?: Yes, Type of Advance Directive: Out of facility DNR (pink MOST or yellow form), Pre-existing out of facility DNR order (yellow form or pink MOST form): Pink MOST form placed in chart (order not valid for inpatient use), Does patient want to make changes to medical advance directive?: No - Patient declined  Chief Complaint  Patient presents with  . Medical Management of Chronic Issues    1 month follow up    HPI:  ***  Past Medical History:  Diagnosis Date  . Anemia    Due to fibroids.  . Bilateral leg edema    a. Chronic.  Marland Kitchen Blind   . Cardiac arrest ([facility]) 12/12/2014  . CHF (congestive heart failure) ([place])   . Coronary artery disease   . Deaf    USE SIGN INTERPRETER.  Divorced from husband but lives with him. Has daughter but she does not care for her.  . Diabetes mellitus    a. Per PCP note 2012 (A1C 9.2) - pt unwilling to take meds and was educated on risk of uncontrolled DM.  b. A1C 5.9 in 11/2013.  Marland Kitchen ESRD on hemodialysis ([place])    Mon, Wed, Fri  . Essential hypertension 08/09/2009   Qualifier: Diagnosis of  By: Sarita Haver  MD, Coralyn Mark    . Fibroid uterus   . Heart murmur   . Hyperlipidemia   . Hypertension    a. Has previously refused blood pressure meds.   . Leiomyoma of uterus 08/09/2009   Qualifier: Diagnosis of  By: Sarita Haver  MD, Coralyn Mark    . MI (myocardial infarction) ([facility]) 11/2014  . Moderate tricuspid regurgitation   .  Osteomyelitis ([facility])   . Psychiatric disorder    She frequently exhibits paranoia and has been diagnosed with psychotic d/o NOS during hospital stay in the past. She is tangetial and perseverative during her visits. She apparently has had a bad experience with mental health in [place] in the past and refuses to discuss mental issues for fear that she will be sent back there. Her paranoia, communication issues, financial woes and lack of fam  . Pulmonary hypertension ([place])     Past Surgical History:  Procedure Laterality Date  . AMPUTATION Bilateral 04/02/2015   Procedure: Amputation Bilateral Great Toes MTP Joint;  Surgeon: Newt Minion, MD;  Location: [facility];  Service: Orthopedics;  Laterality: Bilateral;  . AMPUTATION Left 05/14/2015   Procedure: Left Transmetatarsal Amputation;  Surgeon: Newt Minion, MD;  Location: [facility];  Service: Orthopedics;  Laterality: Left;  . AMPUTATION Right 08/20/2015   Procedure: Transmetatarsal Amputation Right Foot;  Surgeon: Newt Minion, MD;  Location: [facility];  Service: Orthopedics;  Laterality: Right;  . AV FISTULA PLACEMENT Left 01/02/2014   Procedure: ARTERIOVENOUS (AV) FISTULA CREATION;  Surgeon: Rosetta Posner, MD;  Location: [facility];  Service: Vascular;  Laterality: Left;  . INSERTION OF DIALYSIS CATHETER Right 01/02/2014   Procedure: INSERTION OF DIALYSIS CATHETER;  Surgeon: Rosetta Posner, MD;  Location: [facility];  Service: Vascular;  Laterality: Right;  . NO PAST SURGERIES    . ORIF PATELLA Left 05/17/2015   Procedure: OPEN REDUCTION INTERNAL (ORIF) FIXATION PATELLA;  Surgeon: Newt Minion, MD;  Location: [facility];  Service: Orthopedics;  Laterality: Left;    Patient Care Team: [name], DO as PCP - General (Internal Medicine) Nyoka Cowden Phylis Bougie, NP as Nurse Practitioner ([facility]) [facility], [place] ([place])  Social History   Social History  . Marital status: Widowed    Spouse name: N/A  . Number of children: N/A  . Years  of education: N/A   Occupational History  . Not on file.   Social History Main Topics  . Smoking status: Never Smoker  . Smokeless tobacco: Never Used     Comment: Prior dip  . Alcohol use No  . Drug use: No  . Sexual activity: Not on file   Other Topics Concern  . Not on file   Social History Narrative  . No narrative on file     reports that she has never smoked. She has never used smokeless tobacco. She reports that she does not drink alcohol or use drugs.  Family History  Problem Relation Age of Onset  . Other Father        Drowned from fishing?  . Heart disease Unknown    Family Status  Relation Status  . Mother Deceased       Unknown  . Father Deceased  . Unknown (Not Specified)    Immunization History  Administered Date(s) Administered  . Influenza,inj,Quad PF,36+ Mos 01/03/2014  . Influenza-Unspecified 09/24/2014, 11/13/2014, 02/01/2016, 09/20/2016  . PPD Test 07/28/2014, 09/15/2016  . Pneumococcal Polysaccharide-23 01/03/2014    No Known Allergies  Medications: Patient's Medications  New Prescriptions   No medications on file  Previous Medications   AMINO ACIDS-PROTEIN HYDROLYS (FEEDING SUPPLEMENT, PRO-STAT SUGAR FREE 64,) LIQD    Take 60 mLs by mouth 2 (two) times daily.   AMIODARONE (PACERONE) 200 MG TABLET    Take 200 mg by mouth every evening.    ARIPIPRAZOLE (ABILIFY) 10 MG TABLET    Take 10 mg by mouth at bedtime.    CALCIUM ACETATE (PHOSLO) 667 MG TABLET    Take 3 tablets by mouth before meals every Sunday, Tuesday, Thursday and Saturday for Dialysis.  Give 3 tablets by mouth two times a day every Mon, Wed, Friday for renal buffer   CLOPIDOGREL (PLAVIX) 75 MG TABLET    Take 75 mg by mouth every evening.   METOPROLOL SUCCINATE (TOPROL-XL) 50 MG 24 HR TABLET    Take 50 mg by mouth every evening. Take with or immediately following a meal.   NITROGLYCERIN (NITROSTAT) 0.4 MG SL TABLET    Place 0.4 mg under the tongue every 5 (five) minutes as needed  for chest pain.   POLYETHYLENE GLYCOL (MIRALAX / GLYCOLAX) PACKET    Take 17 g by mouth daily.    SKIN PROTECTANTS, MISC. (CALAZIME SKIN PROTECTANT EX)    Apply to buttocks 2 times daily   TRAMADOL (ULTRAM) 50 MG TABLET    Take 50 mg by mouth daily.  Modified Medications   No medications on file  Discontinued Medications   No medications on file    Review of Systems  Vitals:   07/30/17 1124  BP: (!) 142/76  Pulse: 70  Resp: 20  Temp: 97.8 F (36.6 C)  SpO2: 97%  Weight: 154 lb 9.6 oz (70.1 kg)  Height: 5\' 5"  (1.651 m)   Body mass index is 25.73 kg/m.  Physical Exam   Labs reviewed: No visits with results within 3 Month(s) from this visit.  Latest known visit with results is:  Nursing Home on 03/06/2017  Component Date Value Ref Range Status  . Hemoglobin 11/08/2016 9.9* 12.0 - 16.0 g/dL Final  . HCT 11/08/2016 31* 36 - 46 % Final  . Neutrophils Absolute 11/08/2016 2  /L Final  . Platelets 11/08/2016 196  150 - 399 K/L Final  . WBC 11/08/2016 5.5  10^3/mL Final  . Glucose 11/08/2016 95  mg/dL Final  . BUN 11/08/2016 71* 4 - 21 mg/dL Final  . Creatinine 11/08/2016 6.6* 0.5 - 1.1 mg/dL Final  . Potassium 11/08/2016 5.0  3.4 - 5.3 mmol/L Final  . Sodium 11/08/2016 137  137 - 147 mmol/L Final  . Triglycerides 11/08/2016 37* 40 - 160 mg/dL Final  . Cholesterol 11/08/2016 182  0 - 200 mg/dL Final  . HDL 11/08/2016 57  35 - 70 mg/dL Final  . LDL Cholesterol 11/08/2016 117  mg/dL Final  . Alkaline Phosphatase 11/08/2016 59  25 - 125 U/L Final  . ALT 11/08/2016 16  7 - 35 U/L Final  . AST 11/08/2016 12* 13 - 35 U/L Final  . Bilirubin, Total 11/08/2016 0.3  mg/dL Final    No results found.   Assessment/Plan    Monica S. Perlie Gold  [facility] and Adult Medicine [address] [place], [place] [address] [phone] Cell (Monday-Friday 8 AM - 5 PM) [phone] After 5 PM and follow prompts

## 2017-07-31 DIAGNOSIS — E119 Type 2 diabetes mellitus without complications: Secondary | ICD-10-CM | POA: Diagnosis not present

## 2017-07-31 LAB — HEMOGLOBIN A1C: Hemoglobin A1C: 5.1

## 2017-08-01 ENCOUNTER — Non-Acute Institutional Stay (SKILLED_NURSING_FACILITY): Payer: Medicare Other | Admitting: Adult Health

## 2017-08-01 ENCOUNTER — Encounter: Payer: Self-pay | Admitting: Adult Health

## 2017-08-01 DIAGNOSIS — N2581 Secondary hyperparathyroidism of renal origin: Secondary | ICD-10-CM | POA: Diagnosis not present

## 2017-08-01 DIAGNOSIS — N186 End stage renal disease: Secondary | ICD-10-CM

## 2017-08-01 DIAGNOSIS — D631 Anemia in chronic kidney disease: Secondary | ICD-10-CM | POA: Diagnosis not present

## 2017-08-01 DIAGNOSIS — E1129 Type 2 diabetes mellitus with other diabetic kidney complication: Secondary | ICD-10-CM | POA: Diagnosis not present

## 2017-08-01 DIAGNOSIS — D649 Anemia, unspecified: Secondary | ICD-10-CM | POA: Diagnosis not present

## 2017-08-01 LAB — HEPATIC FUNCTION PANEL
ALT: 13 (ref 7–35)
AST: 13 (ref 13–35)
Alkaline Phosphatase: 96 (ref 25–125)
Bilirubin, Total: 0.4

## 2017-08-01 LAB — BASIC METABOLIC PANEL
BUN: 64 — AB (ref 4–21)
Creatinine: 5.9 — AB (ref 0.5–1.1)
Glucose: 95
Potassium: 5.5 — AB (ref 3.4–5.3)
Sodium: 132 — AB (ref 137–147)

## 2017-08-01 LAB — CBC AND DIFFERENTIAL
HCT: 33 — AB (ref 36–46)
Hemoglobin: 11 — AB (ref 12.0–16.0)
Neutrophils Absolute: 4
Platelets: 247 (ref 150–399)
WBC: 7

## 2017-08-01 NOTE — Progress Notes (Signed)
Location:   [facility] Room Number: 202 A Place of Service:  SNF (31)   CODE STATUS: Full Code  No Known Allergies  Chief Complaint  Patient presents with  . Acute Visit    Hallucinations    HPI:  The nursing staff is concerned that she has been having hallucinations. Per activity she was signing about people who were not there. She is also having some twitching as well. Her hands are shaking as well. She is due for her dialysis treatment today. More than likely this does represent an electrolyte imbalance. She is unable to participate in the hpi or ros as she is blind deaf and mute.   Past Medical History:  Diagnosis Date  . Anemia    Due to fibroids.  . Bilateral leg edema    a. Chronic.  Marland Kitchen Blind   . Cardiac arrest ([place]) 12/12/2014  . CHF (congestive heart failure) ([place])   . Coronary artery disease   . Deaf    USE SIGN INTERPRETER.  Divorced from husband but lives with him. Has daughter but she does not care for her.  . Diabetes mellitus    a. Per PCP note 2012 (A1C 9.2) - pt unwilling to take meds and was educated on risk of uncontrolled DM.  b. A1C 5.9 in 11/2013.  Marland Kitchen ESRD on hemodialysis ([place])    Mon, Wed, Fri  . Essential hypertension 08/09/2009   Qualifier: Diagnosis of  By: Sarita Haver  MD, Coralyn Mark    . Fibroid uterus   . Heart murmur   . Hyperlipidemia   . Hypertension    a. Has previously refused blood pressure meds.   . Leiomyoma of uterus 08/09/2009   Qualifier: Diagnosis of  By: Sarita Haver  MD, Coralyn Mark    . MI (myocardial infarction) ([place]) 11/2014  . Moderate tricuspid regurgitation   . Osteomyelitis ([place])   . Psychiatric disorder    She frequently exhibits paranoia and has been diagnosed with psychotic d/o NOS during hospital stay in the past. She is tangetial and perseverative during her visits. She apparently has had a bad experience with mental health in [place] in the past and refuses to discuss mental issues for fear that she will be sent back there.  Her paranoia, communication issues, financial woes and lack of fam  . Pulmonary hypertension ([place])     Past Surgical History:  Procedure Laterality Date  . AMPUTATION Bilateral 04/02/2015   Procedure: Amputation Bilateral Great Toes MTP Joint;  Surgeon: Newt Minion, MD;  Location: [facility];  Service: Orthopedics;  Laterality: Bilateral;  . AMPUTATION Left 05/14/2015   Procedure: Left Transmetatarsal Amputation;  Surgeon: Newt Minion, MD;  Location: [facility];  Service: Orthopedics;  Laterality: Left;  . AMPUTATION Right 08/20/2015   Procedure: Transmetatarsal Amputation Right Foot;  Surgeon: Newt Minion, MD;  Location: [facility];  Service: Orthopedics;  Laterality: Right;  . AV FISTULA PLACEMENT Left 01/02/2014   Procedure: ARTERIOVENOUS (AV) FISTULA CREATION;  Surgeon: Rosetta Posner, MD;  Location: [facility];  Service: Vascular;  Laterality: Left;  . INSERTION OF DIALYSIS CATHETER Right 01/02/2014   Procedure: INSERTION OF DIALYSIS CATHETER;  Surgeon: Rosetta Posner, MD;  Location: [facility];  Service: Vascular;  Laterality: Right;  . NO PAST SURGERIES    . ORIF PATELLA Left 05/17/2015   Procedure: OPEN REDUCTION INTERNAL (ORIF) FIXATION PATELLA;  Surgeon: Newt Minion, MD;  Location: [facility];  Service: Orthopedics;  Laterality: Left;    Social  History   Social History  . Marital status: Widowed    Spouse name: N/A  . Number of children: N/A  . Years of education: N/A   Occupational History  . Not on file.   Social History Main Topics  . Smoking status: Never Smoker  . Smokeless tobacco: Never Used     Comment: Prior dip  . Alcohol use No  . Drug use: No  . Sexual activity: Not on file   Other Topics Concern  . Not on file   Social History Narrative  . No narrative on file   Family History  Problem Relation Age of Onset  . Other Father        Drowned from fishing?  . Heart disease Unknown       VITAL SIGNS BP (!) 172/73   Pulse 64   Temp (!) 97 F (36.1 C)   Resp 18   Ht 5\' 5"   (1.651 m)   Wt 155 lb 3.2 oz (70.4 kg)   SpO2 97%   BMI 25.83 kg/m   Patient's Medications  New Prescriptions   No medications on file  Previous Medications   AMINO ACIDS-PROTEIN HYDROLYS (FEEDING SUPPLEMENT, PRO-STAT SUGAR FREE 64,) LIQD    Take 60 mLs by mouth 2 (two) times daily.   AMIODARONE (PACERONE) 200 MG TABLET    Take 200 mg by mouth every evening.    ARIPIPRAZOLE (ABILIFY) 10 MG TABLET    Take 10 mg by mouth at bedtime.    CALCIUM ACETATE (PHOSLO) 667 MG TABLET    Take 3 tablets by mouth before meals every Sunday, Tuesday, Thursday and Saturday for Dialysis.  Give 3 tablets by mouth two times a day every Mon, Wed, Friday for renal buffer   CLOPIDOGREL (PLAVIX) 75 MG TABLET    Take 75 mg by mouth every evening.   METOPROLOL SUCCINATE (TOPROL-XL) 50 MG 24 HR TABLET    Take 50 mg by mouth every evening. Take with or immediately following a meal.   NITROGLYCERIN (NITROSTAT) 0.4 MG SL TABLET    Place 0.4 mg under the tongue every 5 (five) minutes as needed for chest pain.   POLYETHYLENE GLYCOL (MIRALAX / GLYCOLAX) PACKET    Take 17 g by mouth daily.    SKIN PROTECTANTS, MISC. (CALAZIME SKIN PROTECTANT EX)    Apply to buttocks 2 times daily   TRAMADOL (ULTRAM) 50 MG TABLET    Take 50 mg by mouth daily.  Modified Medications   No medications on file  Discontinued Medications   No medications on file     SIGNIFICANT DIAGNOSTIC EXAMS  PREVIOUS  11-24-14: Myoview: 1. Fixed defects involving the apex, anterior lateral wall and the inferior wall. The apex and anterior lateral wall fixed defects aresuggestive for an infarct. No evidence for reversibility or ischemia. 2. Diffuse hypokinesia and minimal wall motion along the lateral wall. 3. Left ventricular ejection fraction is 35%. 4. High-risk stress test findings*.  04-16-15: 2-d echo: - Left ventricle: The cavity size was mildly dilated. Wall thickness was increased in a pattern of mild LVH. Systolic function was moderately to  severely reduced. The estimated ejection fraction was in the range of 30% to 35%. There is akinesis of the anteroseptal and apical myocardium. Features are consistent with a pseudonormal left ventricular filling pattern, with concomitant abnormal relaxation and increased filling pressure (grade 2 diastolic dysfunction). Doppler parameters are consistent with high ventricular filling pressure. - Mitral valve: Calcified annulus. There was mild regurgitation. -  Left atrium: The atrium was mildly dilated. - Pulmonary arteries: Systolic pressure was mildly increased.   NO NEW EXAMS   LABS REVIEWED: she will decline labs PREVIOUS   11-08-16: wbc 5.5; hgb 9.9; hct 31.2; mcv 99.3; plt 196; glucose 95; bun 71,2; creat 6.55; k+ 5.0; na++ 137; liver normal albumin 4.0; hgb a1c 5.4   04-06-17: wbc 5.1; hgb 11.3; hct 34.2 mcv 96.3; plt 208; tsh 0.8; hgb a1c 5.5 chol 197; ldl 130; trig 51; hdl 57   TODAY:  08-01-17: wbc 7.0; hgb 11.0; hct 33.3; mcv 97.1; plt 247; glucose 95; bun 64.2; creat 5.86; k+ 5.5; na++ 132; liver normal albumin 4.5     Review of Systems  Unable to perform ROS: Other (is blind deaf and mute )   Physical Exam  Constitutional: She appears well-developed and well-nourished. No distress.  Eyes: Conjunctivae are normal.  Neck: Neck supple. No JVD present. No thyromegaly present.  Cardiovascular: Normal rate, regular rhythm and intact distal pulses.   Murmur heard. Respiratory: Effort normal and breath sounds normal. No respiratory distress. She has no wheezes.  GI: Soft. Bowel sounds are normal. She exhibits no distension. There is no tenderness.  Musculoskeletal: She exhibits no edema.  Able to move all extremities  Has bilateral transmetatarsal amputations   Lymphadenopathy:    She has no cervical adenopathy.  Neurological: She is alert.  She has bilateral hand shaking with occasional twitching present no signs of distress present Is not aware of the abnormal movements   Skin:  Skin is warm and dry. She is not diaphoretic.  Left upper arm A/V fistula + thrill +bruitt  Psychiatric: She has a normal mood and affect.    ASSESSMENT/ PLAN:  1. ESRD on hemodialysis: is followed by nephrology. Will continue dialysis three times weekly. Will continue phoslo 667 mg 3 tabs  Twice  daily on dialysis days and three times daily on other days  The electrolyte imbalance is most likely the cause of her abnormal movements. Dialysis is aware and is in agreement they will pull off enough to help lower her k+ and raise her na+++     MD is aware of resident's narcotic use and is in agreement with current plan of care. We will attempt to wean resident as apropriate     Ok Edwards NP [facility]  Contact [phone] Monday through Friday 8am- 5pm  After hours call [phone]

## 2017-08-03 ENCOUNTER — Other Ambulatory Visit: Payer: Self-pay

## 2017-08-03 DIAGNOSIS — N186 End stage renal disease: Secondary | ICD-10-CM | POA: Diagnosis not present

## 2017-08-03 DIAGNOSIS — E1129 Type 2 diabetes mellitus with other diabetic kidney complication: Secondary | ICD-10-CM | POA: Diagnosis not present

## 2017-08-03 DIAGNOSIS — D631 Anemia in chronic kidney disease: Secondary | ICD-10-CM | POA: Diagnosis not present

## 2017-08-03 DIAGNOSIS — N2581 Secondary hyperparathyroidism of renal origin: Secondary | ICD-10-CM | POA: Diagnosis not present

## 2017-08-03 MED ORDER — TRAMADOL HCL 50 MG PO TABS: 50.0000 mg | ORAL_TABLET | Freq: Every day | ORAL | 0 refills | Status: DC

## 2017-08-03 NOTE — Telephone Encounter (Signed)
RX faxed to [facility] @ [phone], phone number [phone] 

## 2017-08-06 DIAGNOSIS — D631 Anemia in chronic kidney disease: Secondary | ICD-10-CM | POA: Diagnosis not present

## 2017-08-06 DIAGNOSIS — N186 End stage renal disease: Secondary | ICD-10-CM | POA: Diagnosis not present

## 2017-08-06 DIAGNOSIS — N2581 Secondary hyperparathyroidism of renal origin: Secondary | ICD-10-CM | POA: Diagnosis not present

## 2017-08-06 DIAGNOSIS — E1129 Type 2 diabetes mellitus with other diabetic kidney complication: Secondary | ICD-10-CM | POA: Diagnosis not present

## 2017-08-08 DIAGNOSIS — E1129 Type 2 diabetes mellitus with other diabetic kidney complication: Secondary | ICD-10-CM | POA: Diagnosis not present

## 2017-08-08 DIAGNOSIS — N186 End stage renal disease: Secondary | ICD-10-CM | POA: Diagnosis not present

## 2017-08-08 DIAGNOSIS — D631 Anemia in chronic kidney disease: Secondary | ICD-10-CM | POA: Diagnosis not present

## 2017-08-08 DIAGNOSIS — N2581 Secondary hyperparathyroidism of renal origin: Secondary | ICD-10-CM | POA: Diagnosis not present

## 2017-08-10 DIAGNOSIS — N186 End stage renal disease: Secondary | ICD-10-CM | POA: Diagnosis not present

## 2017-08-10 DIAGNOSIS — D631 Anemia in chronic kidney disease: Secondary | ICD-10-CM | POA: Diagnosis not present

## 2017-08-10 DIAGNOSIS — E1129 Type 2 diabetes mellitus with other diabetic kidney complication: Secondary | ICD-10-CM | POA: Diagnosis not present

## 2017-08-10 DIAGNOSIS — N2581 Secondary hyperparathyroidism of renal origin: Secondary | ICD-10-CM | POA: Diagnosis not present

## 2017-08-13 DIAGNOSIS — E1129 Type 2 diabetes mellitus with other diabetic kidney complication: Secondary | ICD-10-CM | POA: Diagnosis not present

## 2017-08-13 DIAGNOSIS — D631 Anemia in chronic kidney disease: Secondary | ICD-10-CM | POA: Diagnosis not present

## 2017-08-13 DIAGNOSIS — N2581 Secondary hyperparathyroidism of renal origin: Secondary | ICD-10-CM | POA: Diagnosis not present

## 2017-08-13 DIAGNOSIS — N186 End stage renal disease: Secondary | ICD-10-CM | POA: Diagnosis not present

## 2017-08-15 DIAGNOSIS — D631 Anemia in chronic kidney disease: Secondary | ICD-10-CM | POA: Diagnosis not present

## 2017-08-15 DIAGNOSIS — H26493 Other secondary cataract, bilateral: Secondary | ICD-10-CM | POA: Diagnosis not present

## 2017-08-15 DIAGNOSIS — Z79899 Other long term (current) drug therapy: Secondary | ICD-10-CM | POA: Diagnosis not present

## 2017-08-15 DIAGNOSIS — N2581 Secondary hyperparathyroidism of renal origin: Secondary | ICD-10-CM | POA: Diagnosis not present

## 2017-08-15 DIAGNOSIS — Z961 Presence of intraocular lens: Secondary | ICD-10-CM | POA: Diagnosis not present

## 2017-08-15 DIAGNOSIS — N186 End stage renal disease: Secondary | ICD-10-CM | POA: Diagnosis not present

## 2017-08-15 DIAGNOSIS — E1129 Type 2 diabetes mellitus with other diabetic kidney complication: Secondary | ICD-10-CM | POA: Diagnosis not present

## 2017-08-15 DIAGNOSIS — E113313 Type 2 diabetes mellitus with moderate nonproliferative diabetic retinopathy with macular edema, bilateral: Secondary | ICD-10-CM | POA: Diagnosis not present

## 2017-08-15 LAB — HM DIABETES EYE EXAM

## 2017-08-16 ENCOUNTER — Other Ambulatory Visit: Payer: Self-pay

## 2017-08-16 MED ORDER — TRAMADOL HCL 50 MG PO TABS: 50.0000 mg | ORAL_TABLET | Freq: Every day | ORAL | 0 refills | Status: DC

## 2017-08-16 NOTE — Telephone Encounter (Signed)
RX faxed to [facility] @ [phone], phone number [phone] 

## 2017-08-17 DIAGNOSIS — E1129 Type 2 diabetes mellitus with other diabetic kidney complication: Secondary | ICD-10-CM | POA: Diagnosis not present

## 2017-08-17 DIAGNOSIS — D631 Anemia in chronic kidney disease: Secondary | ICD-10-CM | POA: Diagnosis not present

## 2017-08-17 DIAGNOSIS — N2581 Secondary hyperparathyroidism of renal origin: Secondary | ICD-10-CM | POA: Diagnosis not present

## 2017-08-17 DIAGNOSIS — N186 End stage renal disease: Secondary | ICD-10-CM | POA: Diagnosis not present

## 2017-08-20 DIAGNOSIS — N186 End stage renal disease: Secondary | ICD-10-CM | POA: Diagnosis not present

## 2017-08-20 DIAGNOSIS — D631 Anemia in chronic kidney disease: Secondary | ICD-10-CM | POA: Diagnosis not present

## 2017-08-20 DIAGNOSIS — N2581 Secondary hyperparathyroidism of renal origin: Secondary | ICD-10-CM | POA: Diagnosis not present

## 2017-08-20 DIAGNOSIS — E1129 Type 2 diabetes mellitus with other diabetic kidney complication: Secondary | ICD-10-CM | POA: Diagnosis not present

## 2017-08-21 ENCOUNTER — Encounter: Payer: Self-pay | Admitting: Adult Health

## 2017-08-21 ENCOUNTER — Non-Acute Institutional Stay (SKILLED_NURSING_FACILITY): Payer: Medicare Other | Admitting: Adult Health

## 2017-08-21 DIAGNOSIS — F22 Delusional disorders: Secondary | ICD-10-CM | POA: Diagnosis not present

## 2017-08-21 NOTE — Progress Notes (Signed)
Location:   [facility] Room Number: 202 A Place of Service:  SNF (31)   CODE STATUS: Full Code  No Known Allergies  Chief Complaint  Patient presents with  . Acute Visit    Increase in behaviors and resisting care    HPI:  Staff reports for the past several days she has been more agitated and more resistant to nursing care. She is more paranoid; she is taking her abilify; with much work. Today she is calm; she is unable to fully participate in the phi or ros; she is deaf and blind. She did write good on my arm. Staff reports that she is doing much better at this time.    Past Medical History:  Diagnosis Date  . Anemia    Due to fibroids.  . Bilateral leg edema    a. Chronic.  Marland Kitchen Blind   . Cardiac arrest ([place]) 12/12/2014  . CHF (congestive heart failure) ([place])   . Coronary artery disease   . Deaf    USE SIGN INTERPRETER.  Divorced from husband but lives with him. Has daughter but she does not care for her.  . Diabetes mellitus    a. Per PCP note 2012 (A1C 9.2) - pt unwilling to take meds and was educated on risk of uncontrolled DM.  b. A1C 5.9 in 11/2013.  Marland Kitchen ESRD on hemodialysis ([place])    Mon, Wed, Fri  . Essential hypertension 08/09/2009   Qualifier: Diagnosis of  By: Sarita Haver  MD, Coralyn Mark    . Fibroid uterus   . Heart murmur   . Hyperlipidemia   . Hypertension    a. Has previously refused blood pressure meds.   . Leiomyoma of uterus 08/09/2009   Qualifier: Diagnosis of  By: Sarita Haver  MD, Coralyn Mark    . MI (myocardial infarction) ([place]) 11/2014  . Moderate tricuspid regurgitation   . Osteomyelitis ([place])   . Psychiatric disorder    She frequently exhibits paranoia and has been diagnosed with psychotic d/o NOS during hospital stay in the past. She is tangetial and perseverative during her visits. She apparently has had a bad experience with mental health in [place] in the past and refuses to discuss mental issues for fear that she will be sent back there. Her paranoia,  communication issues, financial woes and lack of fam  . Pulmonary hypertension ([place])     Past Surgical History:  Procedure Laterality Date  . AMPUTATION Bilateral 04/02/2015   Procedure: Amputation Bilateral Great Toes MTP Joint;  Surgeon: Newt Minion, MD;  Location: [facility];  Service: Orthopedics;  Laterality: Bilateral;  . AMPUTATION Left 05/14/2015   Procedure: Left Transmetatarsal Amputation;  Surgeon: Newt Minion, MD;  Location: [facility];  Service: Orthopedics;  Laterality: Left;  . AMPUTATION Right 08/20/2015   Procedure: Transmetatarsal Amputation Right Foot;  Surgeon: Newt Minion, MD;  Location: [facility];  Service: Orthopedics;  Laterality: Right;  . AV FISTULA PLACEMENT Left 01/02/2014   Procedure: ARTERIOVENOUS (AV) FISTULA CREATION;  Surgeon: Rosetta Posner, MD;  Location: [facility];  Service: Vascular;  Laterality: Left;  . INSERTION OF DIALYSIS CATHETER Right 01/02/2014   Procedure: INSERTION OF DIALYSIS CATHETER;  Surgeon: Rosetta Posner, MD;  Location: [facility];  Service: Vascular;  Laterality: Right;  . NO PAST SURGERIES    . ORIF PATELLA Left 05/17/2015   Procedure: OPEN REDUCTION INTERNAL (ORIF) FIXATION PATELLA;  Surgeon: Newt Minion, MD;  Location: [facility];  Service: Orthopedics;  Laterality: Left;  Social History   Social History  . Marital status: Widowed    Spouse name: N/A  . Number of children: N/A  . Years of education: N/A   Occupational History  . Not on file.   Social History Main Topics  . Smoking status: Never Smoker  . Smokeless tobacco: Never Used     Comment: Prior dip  . Alcohol use No  . Drug use: No  . Sexual activity: Not on file   Other Topics Concern  . Not on file   Social History Narrative  . No narrative on file   Family History  Problem Relation Age of Onset  . Other Father        Drowned from fishing?  . Heart disease Unknown       VITAL SIGNS BP 122/89   Pulse 77   Temp 98 F (36.7 C)   Resp 20   Ht 5\' 5"  (1.651 m)   Wt 155 lb  3.2 oz (70.4 kg)   SpO2 98%   BMI 25.83 kg/m   Patient's Medications  New Prescriptions   No medications on file  Previous Medications   AMINO ACIDS-PROTEIN HYDROLYS (FEEDING SUPPLEMENT, PRO-STAT SUGAR FREE 64,) LIQD    Take 60 mLs by mouth 2 (two) times daily.   AMIODARONE (PACERONE) 200 MG TABLET    Take 200 mg by mouth every evening.    ARIPIPRAZOLE (ABILIFY) 10 MG TABLET    Take 10 mg by mouth at bedtime.    CALCIUM ACETATE (PHOSLO) 667 MG TABLET    Take 3 tablets by mouth before meals every Sunday, Tuesday, Thursday and Saturday for Dialysis.  Give 3 tablets by mouth two times a day every Mon, Wed, Friday for renal buffer   CLOPIDOGREL (PLAVIX) 75 MG TABLET    Take 75 mg by mouth every evening.   METOPROLOL SUCCINATE (TOPROL-XL) 50 MG 24 HR TABLET    Take 50 mg by mouth every evening. Take with or immediately following a meal.   NITROGLYCERIN (NITROSTAT) 0.4 MG SL TABLET    Place 0.4 mg under the tongue every 5 (five) minutes as needed for chest pain.   POLYETHYLENE GLYCOL (MIRALAX / GLYCOLAX) PACKET    Take 17 g by mouth daily as needed.    TRAMADOL (ULTRAM) 50 MG TABLET    Take 1 tablet (50 mg total) by mouth daily.  Modified Medications   No medications on file  Discontinued Medications   SKIN PROTECTANTS, MISC. (CALAZIME SKIN PROTECTANT EX)    Apply to buttocks 2 times daily     SIGNIFICANT DIAGNOSTIC EXAMS  PREVIOUS  11-24-14: Myoview: 1. Fixed defects involving the apex, anterior lateral wall and the inferior wall. The apex and anterior lateral wall fixed defects aresuggestive for an infarct. No evidence for reversibility or ischemia. 2. Diffuse hypokinesia and minimal wall motion along the lateral wall. 3. Left ventricular ejection fraction is 35%. 4. High-risk stress test findings*.  04-16-15: 2-d echo: - Left ventricle: The cavity size was mildly dilated. Wall thickness was increased in a pattern of mild LVH. Systolic function was moderately to severely reduced. The  estimated ejection fraction was in the range of 30% to 35%. There is akinesis of the anteroseptal and apical myocardium. Features are consistent with a pseudonormal left ventricular filling pattern, with concomitant abnormal relaxation and increased filling pressure (grade 2 diastolic dysfunction). Doppler parameters are consistent with high ventricular filling pressure. - Mitral valve: Calcified annulus. There was mild regurgitation. - Left atrium:  The atrium was mildly dilated. - Pulmonary arteries: Systolic pressure was mildly increased.   NO NEW EXAMS   LABS REVIEWED: she will decline labs PREVIOUS   11-08-16: wbc 5.5; hgb 9.9; hct 31.2; mcv 99.3; plt 196; glucose 95; bun 71,2; creat 6.55; k+ 5.0; na++ 137; liver normal albumin 4.0; hgb a1c 5.4   04-06-17: wbc 5.1; hgb 11.3; hct 34.2 mcv 96.3; plt 208; tsh 0.8; hgb a1c 5.5 chol 197; ldl 130; trig 51; hdl 57  08-01-17: wbc 7.0; hgb 11.0; hct 33.3; mcv 97.1; plt 247; glucose 95; bun 64.2; creat 5.86; k+ 5.5; na++ 132; liver normal albumin 4.5  NO NEW LABS.    Review of Systems  Unable to perform ROS: Other (is blind deaf and mute )    Physical Exam  Constitutional: She appears well-developed and well-nourished. No distress.  Eyes: Conjunctivae are normal.  Neck: Neck supple. No JVD present. No thyromegaly present.  Cardiovascular: Normal rate, regular rhythm and intact distal pulses.   Murmur heard. Respiratory: Effort normal and breath sounds normal. No respiratory distress. She has no wheezes.  GI: Soft. Bowel sounds are normal. She exhibits no distension. There is no tenderness.  Musculoskeletal: She exhibits no edema.  Able to move all extremities    Lymphadenopathy:    She has no cervical adenopathy.  Neurological: She is alert.  Skin: Skin is warm and dry. She is not diaphoretic.  Left upper arm A/V fistula + thrill +bruitt  Psychiatric: She has a normal mood and affect.   ASSESSMENT/ PLAN:  1. Paranoid psychosis: will  continue her Abilify 20 mg daily at this time will not make any changes; and will continue to monitor her status. If she continues to have agitation; will have psych services see her.     MD is aware of resident's narcotic use and is in agreement with current plan of care. We will attempt to wean resident as apropriate     Ok Edwards NP [facility]  Contact [phone] Monday through Friday 8am- 5pm  After hours call [phone]

## 2017-08-22 DIAGNOSIS — N2581 Secondary hyperparathyroidism of renal origin: Secondary | ICD-10-CM | POA: Diagnosis not present

## 2017-08-22 DIAGNOSIS — D631 Anemia in chronic kidney disease: Secondary | ICD-10-CM | POA: Diagnosis not present

## 2017-08-22 DIAGNOSIS — N186 End stage renal disease: Secondary | ICD-10-CM | POA: Diagnosis not present

## 2017-08-22 DIAGNOSIS — E1129 Type 2 diabetes mellitus with other diabetic kidney complication: Secondary | ICD-10-CM | POA: Diagnosis not present

## 2017-08-24 DIAGNOSIS — E1129 Type 2 diabetes mellitus with other diabetic kidney complication: Secondary | ICD-10-CM | POA: Diagnosis not present

## 2017-08-24 DIAGNOSIS — Z992 Dependence on renal dialysis: Secondary | ICD-10-CM | POA: Diagnosis not present

## 2017-08-24 DIAGNOSIS — D631 Anemia in chronic kidney disease: Secondary | ICD-10-CM | POA: Diagnosis not present

## 2017-08-24 DIAGNOSIS — N2581 Secondary hyperparathyroidism of renal origin: Secondary | ICD-10-CM | POA: Diagnosis not present

## 2017-08-24 DIAGNOSIS — N186 End stage renal disease: Secondary | ICD-10-CM | POA: Diagnosis not present

## 2017-08-27 DIAGNOSIS — E1129 Type 2 diabetes mellitus with other diabetic kidney complication: Secondary | ICD-10-CM | POA: Diagnosis not present

## 2017-08-27 DIAGNOSIS — D631 Anemia in chronic kidney disease: Secondary | ICD-10-CM | POA: Diagnosis not present

## 2017-08-27 DIAGNOSIS — E8779 Other fluid overload: Secondary | ICD-10-CM | POA: Diagnosis not present

## 2017-08-27 DIAGNOSIS — N186 End stage renal disease: Secondary | ICD-10-CM | POA: Diagnosis not present

## 2017-08-27 DIAGNOSIS — N2581 Secondary hyperparathyroidism of renal origin: Secondary | ICD-10-CM | POA: Diagnosis not present

## 2017-08-29 DIAGNOSIS — F29 Unspecified psychosis not due to a substance or known physiological condition: Secondary | ICD-10-CM | POA: Diagnosis not present

## 2017-08-29 DIAGNOSIS — N186 End stage renal disease: Secondary | ICD-10-CM | POA: Diagnosis not present

## 2017-08-29 DIAGNOSIS — R4189 Other symptoms and signs involving cognitive functions and awareness: Secondary | ICD-10-CM | POA: Diagnosis not present

## 2017-08-29 DIAGNOSIS — F329 Major depressive disorder, single episode, unspecified: Secondary | ICD-10-CM | POA: Diagnosis not present

## 2017-08-29 DIAGNOSIS — N2581 Secondary hyperparathyroidism of renal origin: Secondary | ICD-10-CM | POA: Diagnosis not present

## 2017-08-29 DIAGNOSIS — D631 Anemia in chronic kidney disease: Secondary | ICD-10-CM | POA: Diagnosis not present

## 2017-08-29 DIAGNOSIS — F209 Schizophrenia, unspecified: Secondary | ICD-10-CM | POA: Diagnosis not present

## 2017-08-29 DIAGNOSIS — E1129 Type 2 diabetes mellitus with other diabetic kidney complication: Secondary | ICD-10-CM | POA: Diagnosis not present

## 2017-08-29 DIAGNOSIS — E8779 Other fluid overload: Secondary | ICD-10-CM | POA: Diagnosis not present

## 2017-08-31 DIAGNOSIS — N186 End stage renal disease: Secondary | ICD-10-CM | POA: Diagnosis not present

## 2017-08-31 DIAGNOSIS — D631 Anemia in chronic kidney disease: Secondary | ICD-10-CM | POA: Diagnosis not present

## 2017-08-31 DIAGNOSIS — E1129 Type 2 diabetes mellitus with other diabetic kidney complication: Secondary | ICD-10-CM | POA: Diagnosis not present

## 2017-08-31 DIAGNOSIS — N2581 Secondary hyperparathyroidism of renal origin: Secondary | ICD-10-CM | POA: Diagnosis not present

## 2017-08-31 DIAGNOSIS — E8779 Other fluid overload: Secondary | ICD-10-CM | POA: Diagnosis not present

## 2017-09-03 DIAGNOSIS — D631 Anemia in chronic kidney disease: Secondary | ICD-10-CM | POA: Diagnosis not present

## 2017-09-03 DIAGNOSIS — E8779 Other fluid overload: Secondary | ICD-10-CM | POA: Diagnosis not present

## 2017-09-03 DIAGNOSIS — E1129 Type 2 diabetes mellitus with other diabetic kidney complication: Secondary | ICD-10-CM | POA: Diagnosis not present

## 2017-09-03 DIAGNOSIS — N2581 Secondary hyperparathyroidism of renal origin: Secondary | ICD-10-CM | POA: Diagnosis not present

## 2017-09-03 DIAGNOSIS — N186 End stage renal disease: Secondary | ICD-10-CM | POA: Diagnosis not present

## 2017-09-05 DIAGNOSIS — E1129 Type 2 diabetes mellitus with other diabetic kidney complication: Secondary | ICD-10-CM | POA: Diagnosis not present

## 2017-09-05 DIAGNOSIS — N186 End stage renal disease: Secondary | ICD-10-CM | POA: Diagnosis not present

## 2017-09-05 DIAGNOSIS — E8779 Other fluid overload: Secondary | ICD-10-CM | POA: Diagnosis not present

## 2017-09-05 DIAGNOSIS — D631 Anemia in chronic kidney disease: Secondary | ICD-10-CM | POA: Diagnosis not present

## 2017-09-05 DIAGNOSIS — N2581 Secondary hyperparathyroidism of renal origin: Secondary | ICD-10-CM | POA: Diagnosis not present

## 2017-09-07 DIAGNOSIS — N186 End stage renal disease: Secondary | ICD-10-CM | POA: Diagnosis not present

## 2017-09-07 DIAGNOSIS — D631 Anemia in chronic kidney disease: Secondary | ICD-10-CM | POA: Diagnosis not present

## 2017-09-07 DIAGNOSIS — N2581 Secondary hyperparathyroidism of renal origin: Secondary | ICD-10-CM | POA: Diagnosis not present

## 2017-09-07 DIAGNOSIS — E8779 Other fluid overload: Secondary | ICD-10-CM | POA: Diagnosis not present

## 2017-09-07 DIAGNOSIS — E1129 Type 2 diabetes mellitus with other diabetic kidney complication: Secondary | ICD-10-CM | POA: Diagnosis not present

## 2017-09-10 DIAGNOSIS — N186 End stage renal disease: Secondary | ICD-10-CM | POA: Diagnosis not present

## 2017-09-10 DIAGNOSIS — E8779 Other fluid overload: Secondary | ICD-10-CM | POA: Diagnosis not present

## 2017-09-10 DIAGNOSIS — D631 Anemia in chronic kidney disease: Secondary | ICD-10-CM | POA: Diagnosis not present

## 2017-09-10 DIAGNOSIS — E1129 Type 2 diabetes mellitus with other diabetic kidney complication: Secondary | ICD-10-CM | POA: Diagnosis not present

## 2017-09-10 DIAGNOSIS — N2581 Secondary hyperparathyroidism of renal origin: Secondary | ICD-10-CM | POA: Diagnosis not present

## 2017-09-12 ENCOUNTER — Encounter: Payer: Self-pay | Admitting: Adult Health

## 2017-09-12 ENCOUNTER — Non-Acute Institutional Stay (SKILLED_NURSING_FACILITY): Payer: Medicare Other | Admitting: Adult Health

## 2017-09-12 DIAGNOSIS — N186 End stage renal disease: Secondary | ICD-10-CM | POA: Diagnosis not present

## 2017-09-12 DIAGNOSIS — D631 Anemia in chronic kidney disease: Secondary | ICD-10-CM | POA: Diagnosis not present

## 2017-09-12 DIAGNOSIS — E1129 Type 2 diabetes mellitus with other diabetic kidney complication: Secondary | ICD-10-CM | POA: Diagnosis not present

## 2017-09-12 DIAGNOSIS — I739 Peripheral vascular disease, unspecified: Secondary | ICD-10-CM | POA: Diagnosis not present

## 2017-09-12 DIAGNOSIS — I110 Hypertensive heart disease with heart failure: Secondary | ICD-10-CM | POA: Diagnosis not present

## 2017-09-12 DIAGNOSIS — E8779 Other fluid overload: Secondary | ICD-10-CM | POA: Diagnosis not present

## 2017-09-12 DIAGNOSIS — I5042 Chronic combined systolic (congestive) and diastolic (congestive) heart failure: Secondary | ICD-10-CM

## 2017-09-12 DIAGNOSIS — N2581 Secondary hyperparathyroidism of renal origin: Secondary | ICD-10-CM | POA: Diagnosis not present

## 2017-09-12 NOTE — Progress Notes (Signed)
Location:   [place] Room Number: 202 A Place of Service:  SNF (31)   CODE STATUS: Full code  No Known Allergies  Chief Complaint  Patient presents with  . Medical Management of Chronic Issues    ESRD hypertension; PAD; heart failure    HPI:  She is a 61 year old [place] term resident of this facility being seen for the management of her chronic illnesses: ESRD; hypertension; PAD; chronic combined systolic and diastolic heart failure.  She is unable to participate in the hpi or ros. There are no reports of chest pain; leg pain; or lower extremity edema. There are no nursing concerns at this time.     Past Medical History:  Diagnosis Date  . Anemia    Due to fibroids.  . Bilateral leg edema    a. Chronic.  Marland Kitchen Blind   . Cardiac arrest ([place]) 12/12/2014  . CHF (congestive heart failure) ([place])   . Coronary artery disease   . Deaf    USE SIGN INTERPRETER.  Divorced from husband but lives with him. Has daughter but she does not care for her.  . Diabetes mellitus    a. Per PCP note 2012 (A1C 9.2) - pt unwilling to take meds and was educated on risk of uncontrolled DM.  b. A1C 5.9 in 11/2013.  Marland Kitchen ESRD on hemodialysis ([place])    Mon, Wed, Fri  . Essential hypertension 08/09/2009   Qualifier: Diagnosis of  By: Sarita Haver  MD, Coralyn Mark    . Fibroid uterus   . Heart murmur   . Hyperlipidemia   . Hypertension    a. Has previously refused blood pressure meds.   . Leiomyoma of uterus 08/09/2009   Qualifier: Diagnosis of  By: Sarita Haver  MD, Coralyn Mark    . MI (myocardial infarction) ([place]) 11/2014  . Moderate tricuspid regurgitation   . Osteomyelitis ([place])   . Psychiatric disorder    She frequently exhibits paranoia and has been diagnosed with psychotic d/o NOS during hospital stay in the past. She is tangetial and perseverative during her visits. She apparently has had a bad experience with mental health in [place] in the past and refuses to discuss mental issues for fear that she will be  sent back there. Her paranoia, communication issues, financial woes and lack of fam  . Pulmonary hypertension ([place])     Past Surgical History:  Procedure Laterality Date  . AMPUTATION Bilateral 04/02/2015   Procedure: Amputation Bilateral Great Toes MTP Joint;  Surgeon: Newt Minion, MD;  Location: [place];  Service: Orthopedics;  Laterality: Bilateral;  . AMPUTATION Left 05/14/2015   Procedure: Left Transmetatarsal Amputation;  Surgeon: Newt Minion, MD;  Location: [place];  Service: Orthopedics;  Laterality: Left;  . AMPUTATION Right 08/20/2015   Procedure: Transmetatarsal Amputation Right Foot;  Surgeon: Newt Minion, MD;  Location: [place];  Service: Orthopedics;  Laterality: Right;  . AV FISTULA PLACEMENT Left 01/02/2014   Procedure: ARTERIOVENOUS (AV) FISTULA CREATION;  Surgeon: Rosetta Posner, MD;  Location: [place];  Service: Vascular;  Laterality: Left;  . INSERTION OF DIALYSIS CATHETER Right 01/02/2014   Procedure: INSERTION OF DIALYSIS CATHETER;  Surgeon: Rosetta Posner, MD;  Location: [place];  Service: Vascular;  Laterality: Right;  . NO PAST SURGERIES    . ORIF PATELLA Left 05/17/2015   Procedure: OPEN REDUCTION INTERNAL (ORIF) FIXATION PATELLA;  Surgeon: Newt Minion, MD;  Location: [place];  Service: Orthopedics;  Laterality: Left;  Social History   Social History  . Marital status: Widowed    Spouse name: N/A  . Number of children: N/A  . Years of education: N/A   Occupational History  . Not on file.   Social History Main Topics  . Smoking status: Never Smoker  . Smokeless tobacco: Never Used     Comment: Prior dip  . Alcohol use No  . Drug use: No  . Sexual activity: Not on file   Other Topics Concern  . Not on file   Social History Narrative  . No narrative on file   Family History  Problem Relation Age of Onset  . Other Father        Drowned from fishing?  . Heart disease Unknown       VITAL SIGNS BP 138/60   Pulse 60   Temp 98.4 F (36.9 C)   Resp 18    Ht 5\' 5"  (1.651 m)   Wt 153 lb 9.6 oz (69.7 kg)   SpO2 98%   BMI 25.56 kg/m   Patient's Medications  New Prescriptions   No medications on file  Previous Medications   AMINO ACIDS-PROTEIN HYDROLYS (FEEDING SUPPLEMENT, PRO-STAT SUGAR FREE 64,) LIQD    Take 60 mLs by mouth 2 (two) times daily.   AMIODARONE (PACERONE) 200 MG TABLET    Take 200 mg by mouth every evening.    ARIPIPRAZOLE (ABILIFY) 10 MG TABLET    Take 10 mg by mouth at bedtime.    CALCIUM ACETATE (PHOSLO) 667 MG TABLET    Take 3 tablets by mouth before meals every Sunday, Tuesday, Thursday and Saturday for Dialysis.  Give 3 tablets by mouth two times a day every Mon, Wed, Friday for renal buffer   CLOPIDOGREL (PLAVIX) 75 MG TABLET    Take 75 mg by mouth every evening.   METOPROLOL SUCCINATE (TOPROL-XL) 50 MG 24 HR TABLET    Take 50 mg by mouth every evening. Take with or immediately following a meal.   NITROGLYCERIN (NITROSTAT) 0.4 MG SL TABLET    Place 0.4 mg under the tongue every 5 (five) minutes as needed for chest pain.   POLYETHYLENE GLYCOL (MIRALAX / GLYCOLAX) PACKET    Take 17 g by mouth daily as needed.    TRAMADOL (ULTRAM) 50 MG TABLET    Take 1 tablet (50 mg total) by mouth daily.  Modified Medications   No medications on file  Discontinued Medications   No medications on file     SIGNIFICANT DIAGNOSTIC EXAMS  PREVIOUS  11-24-14: Myoview: 1. Fixed defects involving the apex, anterior lateral wall and the inferior wall. The apex and anterior lateral wall fixed defects aresuggestive for an infarct. No evidence for reversibility or ischemia. 2. Diffuse hypokinesia and minimal wall motion along the lateral wall. 3. Left ventricular ejection fraction is 35%. 4. High-risk stress test findings*.  04-16-15: 2-d echo: - Left ventricle: The cavity size was mildly dilated. Wall thickness was increased in a pattern of mild LVH. Systolic function was moderately to severely reduced. The estimated ejection fraction was in  the range of 30% to 35%. There is akinesis of the anteroseptal and apical myocardium. Features are consistent with a pseudonormal left ventricular filling pattern, with concomitant abnormal relaxation and increased filling pressure (grade 2 diastolic dysfunction). Doppler parameters are consistent with high ventricular filling pressure. - Mitral valve: Calcified annulus. There was mild regurgitation. - Left atrium: The atrium was mildly dilated. - Pulmonary arteries: Systolic pressure was mildly  increased.   NO NEW EXAMS   LABS REVIEWED: she will decline labs PREVIOUS   11-08-16: wbc 5.5; hgb 9.9; hct 31.2; mcv 99.3; plt 196; glucose 95; bun 71,2; creat 6.55; k+ 5.0; na++ 137; liver normal albumin 4.0; hgb a1c 5.4   04-06-17: wbc 5.1; hgb 11.3; hct 34.2 mcv 96.3; plt 208; tsh 0.8; hgb a1c 5.5 chol 197; ldl 130; trig 51; hdl 57  08-01-17: wbc 7.0; hgb 11.0; hct 33.3; mcv 97.1; plt 247; glucose 95; bun 64.2; creat 5.86; k+ 5.5; na++ 132; liver normal albumin 4.5  NO NEW LABS.    Review of Systems  Unable to perform ROS: Other (is blind; deaf and mute )   Physical Exam  Constitutional: She appears well-developed and well-nourished. No distress.  Neck: Normal range of motion. Neck supple. No thyromegaly present.  Cardiovascular: Normal rate, regular rhythm and intact distal pulses.   Murmur heard. Pulmonary/Chest: Effort normal and breath sounds normal.  Abdominal: Soft. Bowel sounds are normal. She exhibits no distension. There is no tenderness.  Musculoskeletal: She exhibits no edema.  Is able to move all extremities Is status post bilateral transmetatarsal amputations    Lymphadenopathy:    She has no cervical adenopathy.  Neurological: She is alert.  Skin: Skin is warm and dry. She is not diaphoretic.  Left upper arm A/V fistula + thrill +bruitt   Psychiatric: She has a normal mood and affect.    ASSESSMENT/ PLAN:  TODAY:   1. ESRD on hemodialysis: is followed by nephrology.  Will continue dialysis three times weekly. Will continue phoslo 667 mg 3 tabs  Twice  daily on dialysis days and three times daily on other days and will monitor   2. Hypertension: 138/60 is presently stable will continue toprol xl 50 mg daily   3. PAD: is without change is status post left and right transmetatarsal amputation: will continue plavix 75 mg daily  Will continue ultram 50 mg daily for pain management   4. Combine systolic and diastolic heart failure: has ischemic cardiomyopathy  EF is 30-35%  (04-16-15); is presently stable will continue toprol xl 50 mg daily  PREVIOUS   5. Sustained V tach: not a candidate for ICD. Is on amiodarone 200 mg daily for heart rate control will monitor   6. FTT: her current weight is 154 pounds; her albumin is 4.0. Will continue supplements per facility protocol.   7. Psychosis; paranoid : will continue abilify 10 mg daily . Will monitor   She does not tolerate any changes to her abilify when this occurs she will become more psychotic   8. Constipation: will continue miralax daily    9. Diabetes: hgb a1c 5.5  is presently not on medications; will not make changes will monitor her status.   10. Dyslipidemia: is presently not on medications; ldl 130  11. Anemia: hgb 11.3; will monitor     MD is aware of resident's narcotic use and is in agreement with current plan of care. We will attempt to wean resident as apropriate     Ok Edwards NP [facility]  Contact [phone] Monday through Friday 8am- 5pm  After hours call [phone]

## 2017-09-14 DIAGNOSIS — E1129 Type 2 diabetes mellitus with other diabetic kidney complication: Secondary | ICD-10-CM | POA: Diagnosis not present

## 2017-09-14 DIAGNOSIS — N186 End stage renal disease: Secondary | ICD-10-CM | POA: Diagnosis not present

## 2017-09-14 DIAGNOSIS — D631 Anemia in chronic kidney disease: Secondary | ICD-10-CM | POA: Diagnosis not present

## 2017-09-14 DIAGNOSIS — E8779 Other fluid overload: Secondary | ICD-10-CM | POA: Diagnosis not present

## 2017-09-14 DIAGNOSIS — N2581 Secondary hyperparathyroidism of renal origin: Secondary | ICD-10-CM | POA: Diagnosis not present

## 2017-09-17 DIAGNOSIS — N186 End stage renal disease: Secondary | ICD-10-CM | POA: Diagnosis not present

## 2017-09-17 DIAGNOSIS — N2581 Secondary hyperparathyroidism of renal origin: Secondary | ICD-10-CM | POA: Diagnosis not present

## 2017-09-17 DIAGNOSIS — E1129 Type 2 diabetes mellitus with other diabetic kidney complication: Secondary | ICD-10-CM | POA: Diagnosis not present

## 2017-09-17 DIAGNOSIS — E8779 Other fluid overload: Secondary | ICD-10-CM | POA: Diagnosis not present

## 2017-09-17 DIAGNOSIS — D631 Anemia in chronic kidney disease: Secondary | ICD-10-CM | POA: Diagnosis not present

## 2017-09-18 DIAGNOSIS — N186 End stage renal disease: Secondary | ICD-10-CM | POA: Diagnosis not present

## 2017-09-18 DIAGNOSIS — E8779 Other fluid overload: Secondary | ICD-10-CM | POA: Diagnosis not present

## 2017-09-18 DIAGNOSIS — N2581 Secondary hyperparathyroidism of renal origin: Secondary | ICD-10-CM | POA: Diagnosis not present

## 2017-09-18 DIAGNOSIS — E1129 Type 2 diabetes mellitus with other diabetic kidney complication: Secondary | ICD-10-CM | POA: Diagnosis not present

## 2017-09-18 DIAGNOSIS — D631 Anemia in chronic kidney disease: Secondary | ICD-10-CM | POA: Diagnosis not present

## 2017-09-19 DIAGNOSIS — N2581 Secondary hyperparathyroidism of renal origin: Secondary | ICD-10-CM | POA: Diagnosis not present

## 2017-09-19 DIAGNOSIS — D631 Anemia in chronic kidney disease: Secondary | ICD-10-CM | POA: Diagnosis not present

## 2017-09-19 DIAGNOSIS — E8779 Other fluid overload: Secondary | ICD-10-CM | POA: Diagnosis not present

## 2017-09-19 DIAGNOSIS — E1129 Type 2 diabetes mellitus with other diabetic kidney complication: Secondary | ICD-10-CM | POA: Diagnosis not present

## 2017-09-19 DIAGNOSIS — N186 End stage renal disease: Secondary | ICD-10-CM | POA: Diagnosis not present

## 2017-09-21 DIAGNOSIS — D631 Anemia in chronic kidney disease: Secondary | ICD-10-CM | POA: Diagnosis not present

## 2017-09-21 DIAGNOSIS — N2581 Secondary hyperparathyroidism of renal origin: Secondary | ICD-10-CM | POA: Diagnosis not present

## 2017-09-21 DIAGNOSIS — N186 End stage renal disease: Secondary | ICD-10-CM | POA: Diagnosis not present

## 2017-09-21 DIAGNOSIS — E1129 Type 2 diabetes mellitus with other diabetic kidney complication: Secondary | ICD-10-CM | POA: Diagnosis not present

## 2017-09-21 DIAGNOSIS — E8779 Other fluid overload: Secondary | ICD-10-CM | POA: Diagnosis not present

## 2017-09-23 DIAGNOSIS — Z992 Dependence on renal dialysis: Secondary | ICD-10-CM | POA: Diagnosis not present

## 2017-09-23 DIAGNOSIS — N186 End stage renal disease: Secondary | ICD-10-CM | POA: Diagnosis not present

## 2017-09-23 DIAGNOSIS — E1129 Type 2 diabetes mellitus with other diabetic kidney complication: Secondary | ICD-10-CM | POA: Diagnosis not present

## 2017-09-24 DIAGNOSIS — N2581 Secondary hyperparathyroidism of renal origin: Secondary | ICD-10-CM | POA: Diagnosis not present

## 2017-09-24 DIAGNOSIS — E1129 Type 2 diabetes mellitus with other diabetic kidney complication: Secondary | ICD-10-CM | POA: Diagnosis not present

## 2017-09-24 DIAGNOSIS — N186 End stage renal disease: Secondary | ICD-10-CM | POA: Diagnosis not present

## 2017-09-24 DIAGNOSIS — Z23 Encounter for immunization: Secondary | ICD-10-CM | POA: Diagnosis not present

## 2017-09-24 DIAGNOSIS — D631 Anemia in chronic kidney disease: Secondary | ICD-10-CM | POA: Diagnosis not present

## 2017-09-24 DIAGNOSIS — Z283 Underimmunization status: Secondary | ICD-10-CM | POA: Diagnosis not present

## 2017-09-26 DIAGNOSIS — N2581 Secondary hyperparathyroidism of renal origin: Secondary | ICD-10-CM | POA: Diagnosis not present

## 2017-09-26 DIAGNOSIS — E1129 Type 2 diabetes mellitus with other diabetic kidney complication: Secondary | ICD-10-CM | POA: Diagnosis not present

## 2017-09-26 DIAGNOSIS — D631 Anemia in chronic kidney disease: Secondary | ICD-10-CM | POA: Diagnosis not present

## 2017-09-26 DIAGNOSIS — N186 End stage renal disease: Secondary | ICD-10-CM | POA: Diagnosis not present

## 2017-09-26 DIAGNOSIS — Z283 Underimmunization status: Secondary | ICD-10-CM | POA: Diagnosis not present

## 2017-09-26 DIAGNOSIS — Z23 Encounter for immunization: Secondary | ICD-10-CM | POA: Diagnosis not present

## 2017-09-28 DIAGNOSIS — D631 Anemia in chronic kidney disease: Secondary | ICD-10-CM | POA: Diagnosis not present

## 2017-09-28 DIAGNOSIS — Z283 Underimmunization status: Secondary | ICD-10-CM | POA: Diagnosis not present

## 2017-09-28 DIAGNOSIS — Z23 Encounter for immunization: Secondary | ICD-10-CM | POA: Diagnosis not present

## 2017-09-28 DIAGNOSIS — N2581 Secondary hyperparathyroidism of renal origin: Secondary | ICD-10-CM | POA: Diagnosis not present

## 2017-09-28 DIAGNOSIS — N186 End stage renal disease: Secondary | ICD-10-CM | POA: Diagnosis not present

## 2017-09-28 DIAGNOSIS — E1129 Type 2 diabetes mellitus with other diabetic kidney complication: Secondary | ICD-10-CM | POA: Diagnosis not present

## 2017-10-01 DIAGNOSIS — N186 End stage renal disease: Secondary | ICD-10-CM | POA: Diagnosis not present

## 2017-10-01 DIAGNOSIS — D631 Anemia in chronic kidney disease: Secondary | ICD-10-CM | POA: Diagnosis not present

## 2017-10-01 DIAGNOSIS — Z283 Underimmunization status: Secondary | ICD-10-CM | POA: Diagnosis not present

## 2017-10-01 DIAGNOSIS — N2581 Secondary hyperparathyroidism of renal origin: Secondary | ICD-10-CM | POA: Diagnosis not present

## 2017-10-01 DIAGNOSIS — E1129 Type 2 diabetes mellitus with other diabetic kidney complication: Secondary | ICD-10-CM | POA: Diagnosis not present

## 2017-10-01 DIAGNOSIS — Z23 Encounter for immunization: Secondary | ICD-10-CM | POA: Diagnosis not present

## 2017-10-03 DIAGNOSIS — Z283 Underimmunization status: Secondary | ICD-10-CM | POA: Diagnosis not present

## 2017-10-03 DIAGNOSIS — Z23 Encounter for immunization: Secondary | ICD-10-CM | POA: Diagnosis not present

## 2017-10-03 DIAGNOSIS — N2581 Secondary hyperparathyroidism of renal origin: Secondary | ICD-10-CM | POA: Diagnosis not present

## 2017-10-03 DIAGNOSIS — N186 End stage renal disease: Secondary | ICD-10-CM | POA: Diagnosis not present

## 2017-10-03 DIAGNOSIS — D631 Anemia in chronic kidney disease: Secondary | ICD-10-CM | POA: Diagnosis not present

## 2017-10-03 DIAGNOSIS — E1129 Type 2 diabetes mellitus with other diabetic kidney complication: Secondary | ICD-10-CM | POA: Diagnosis not present

## 2017-10-05 DIAGNOSIS — N186 End stage renal disease: Secondary | ICD-10-CM | POA: Diagnosis not present

## 2017-10-05 DIAGNOSIS — N2581 Secondary hyperparathyroidism of renal origin: Secondary | ICD-10-CM | POA: Diagnosis not present

## 2017-10-05 DIAGNOSIS — D631 Anemia in chronic kidney disease: Secondary | ICD-10-CM | POA: Diagnosis not present

## 2017-10-05 DIAGNOSIS — Z283 Underimmunization status: Secondary | ICD-10-CM | POA: Diagnosis not present

## 2017-10-05 DIAGNOSIS — Z23 Encounter for immunization: Secondary | ICD-10-CM | POA: Diagnosis not present

## 2017-10-05 DIAGNOSIS — E1129 Type 2 diabetes mellitus with other diabetic kidney complication: Secondary | ICD-10-CM | POA: Diagnosis not present

## 2017-10-08 DIAGNOSIS — N186 End stage renal disease: Secondary | ICD-10-CM | POA: Diagnosis not present

## 2017-10-08 DIAGNOSIS — N2581 Secondary hyperparathyroidism of renal origin: Secondary | ICD-10-CM | POA: Diagnosis not present

## 2017-10-08 DIAGNOSIS — Z23 Encounter for immunization: Secondary | ICD-10-CM | POA: Diagnosis not present

## 2017-10-08 DIAGNOSIS — E1129 Type 2 diabetes mellitus with other diabetic kidney complication: Secondary | ICD-10-CM | POA: Diagnosis not present

## 2017-10-08 DIAGNOSIS — D631 Anemia in chronic kidney disease: Secondary | ICD-10-CM | POA: Diagnosis not present

## 2017-10-08 DIAGNOSIS — Z283 Underimmunization status: Secondary | ICD-10-CM | POA: Diagnosis not present

## 2017-10-09 ENCOUNTER — Non-Acute Institutional Stay (SKILLED_NURSING_FACILITY): Payer: Medicare Other | Admitting: Adult Health

## 2017-10-09 ENCOUNTER — Encounter: Payer: Self-pay | Admitting: Adult Health

## 2017-10-09 DIAGNOSIS — R627 Adult failure to thrive: Secondary | ICD-10-CM

## 2017-10-09 DIAGNOSIS — K5909 Other constipation: Secondary | ICD-10-CM | POA: Diagnosis not present

## 2017-10-09 DIAGNOSIS — F22 Delusional disorders: Secondary | ICD-10-CM

## 2017-10-09 DIAGNOSIS — I472 Ventricular tachycardia, unspecified: Secondary | ICD-10-CM

## 2017-10-09 NOTE — Progress Notes (Signed)
Location:   [place] Room Number: 202 A Place of Service:  SNF (31)   CODE STATUS: Full Code  No Known Allergies  Chief Complaint  Patient presents with  . Medical Management of Chronic Issues    Sustained V tach; paranoid psychosis; failure to thrive; and constipation    HPI:  She is a 61 year old [place] term resident of this facility being seen for the management of her chronic illnesses; sustained ventricular tachycardia; failure to thrive in adult; paranoid psychosis and chronic constipation. She is unable to participate in the hpi or ros; as she is deaf; mute and blind. There are no reports of behavioral changes; she is going to dialysis; no reports of changes in appetite; no significant change in weight. There are no nursing concerns at this time.   Past Medical History:  Diagnosis Date  . Anemia    Due to fibroids.  . Bilateral leg edema    a. Chronic.  Marland Kitchen Blind   . Cardiac arrest ([place]) 12/12/2014  . CHF (congestive heart failure) ([place])   . Coronary artery disease   . Deaf    USE SIGN INTERPRETER.  Divorced from husband but lives with him. Has daughter but she does not care for her.  . Diabetes mellitus    a. Per PCP note 2012 (A1C 9.2) - pt unwilling to take meds and was educated on risk of uncontrolled DM.  b. A1C 5.9 in 11/2013.  Marland Kitchen ESRD on hemodialysis ([place])    Mon, Wed, Fri  . Essential hypertension 08/09/2009   Qualifier: Diagnosis of  By: Sarita Haver  MD, Coralyn Mark    . Fibroid uterus   . Heart murmur   . Hyperlipidemia   . Hypertension    a. Has previously refused blood pressure meds.   . Leiomyoma of uterus 08/09/2009   Qualifier: Diagnosis of  By: Sarita Haver  MD, Coralyn Mark    . MI (myocardial infarction) ([place]) 11/2014  . Moderate tricuspid regurgitation   . Osteomyelitis ([place])   . Psychiatric disorder    She frequently exhibits paranoia and has been diagnosed with psychotic d/o NOS during hospital stay in the past. She is tangetial and perseverative  during her visits. She apparently has had a bad experience with mental health in [place] in the past and refuses to discuss mental issues for fear that she will be sent back there. Her paranoia, communication issues, financial woes and lack of fam  . Pulmonary hypertension ([place])     Past Surgical History:  Procedure Laterality Date  . AMPUTATION Bilateral 04/02/2015   Procedure: Amputation Bilateral Great Toes MTP Joint;  Surgeon: Newt Minion, MD;  Location: [place];  Service: Orthopedics;  Laterality: Bilateral;  . AMPUTATION Left 05/14/2015   Procedure: Left Transmetatarsal Amputation;  Surgeon: Newt Minion, MD;  Location: [place];  Service: Orthopedics;  Laterality: Left;  . AMPUTATION Right 08/20/2015   Procedure: Transmetatarsal Amputation Right Foot;  Surgeon: Newt Minion, MD;  Location: [place];  Service: Orthopedics;  Laterality: Right;  . AV FISTULA PLACEMENT Left 01/02/2014   Procedure: ARTERIOVENOUS (AV) FISTULA CREATION;  Surgeon: Rosetta Posner, MD;  Location: [place];  Service: Vascular;  Laterality: Left;  . INSERTION OF DIALYSIS CATHETER Right 01/02/2014   Procedure: INSERTION OF DIALYSIS CATHETER;  Surgeon: Rosetta Posner, MD;  Location: [facility];  Service: Vascular;  Laterality: Right;  . NO PAST SURGERIES    . ORIF PATELLA Left 05/17/2015   Procedure: OPEN REDUCTION  INTERNAL (ORIF) FIXATION PATELLA;  Surgeon: Newt Minion, MD;  Location: [place];  Service: Orthopedics;  Laterality: Left;    Social History   Social History  . Marital status: Widowed    Spouse name: N/A  . Number of children: N/A  . Years of education: N/A   Occupational History  . Not on file.   Social History Main Topics  . Smoking status: Never Smoker  . Smokeless tobacco: Never Used     Comment: Prior dip  . Alcohol use No  . Drug use: No  . Sexual activity: Not on file   Other Topics Concern  . Not on file   Social History Narrative  . No narrative on file   Family History  Problem Relation Age of Onset    . Other Father        Drowned from fishing?  . Heart disease Unknown       VITAL SIGNS BP (!) 144/78   Pulse 64   Temp 98.1 F (36.7 C)   Resp 18   Ht 5\' 5"  (1.651 m)   Wt 149 lb 14.4 oz (68 kg)   SpO2 98%   BMI 24.94 kg/m   Patient's Medications  New Prescriptions   No medications on file  Previous Medications   AMINO ACIDS-PROTEIN HYDROLYS (FEEDING SUPPLEMENT, PRO-STAT SUGAR FREE 64,) LIQD    Take 60 mLs by mouth 2 (two) times daily.   AMIODARONE (PACERONE) 200 MG TABLET    Take 200 mg by mouth every evening.    ARIPIPRAZOLE (ABILIFY) 10 MG TABLET    Take 10 mg by mouth at bedtime.    CALCIUM ACETATE (PHOSLO) 667 MG TABLET    Take 3 tablets by mouth before meals every Sunday, Tuesday, Thursday and Saturday for Dialysis.  Give 3 tablets by mouth two times a day every Mon, Wed, Friday for renal buffer   CLOPIDOGREL (PLAVIX) 75 MG TABLET    Take 75 mg by mouth every evening.   METOPROLOL SUCCINATE (TOPROL-XL) 50 MG 24 HR TABLET    Take 50 mg by mouth every evening. Take with or immediately following a meal.   NITROGLYCERIN (NITROSTAT) 0.4 MG SL TABLET    Place 0.4 mg under the tongue every 5 (five) minutes as needed for chest pain.   POLYETHYLENE GLYCOL (MIRALAX / GLYCOLAX) PACKET    Take 17 g by mouth daily as needed.    TRAMADOL (ULTRAM) 50 MG TABLET    Take 1 tablet (50 mg total) by mouth daily.  Modified Medications   No medications on file  Discontinued Medications   No medications on file     SIGNIFICANT DIAGNOSTIC EXAMS  PREVIOUS  11-24-14: Myoview: 1. Fixed defects involving the apex, anterior lateral wall and the inferior wall. The apex and anterior lateral wall fixed defects aresuggestive for an infarct. No evidence for reversibility or ischemia. 2. Diffuse hypokinesia and minimal wall motion along the lateral wall. 3. Left ventricular ejection fraction is 35%. 4. High-risk stress test findings*.  04-16-15: 2-d echo: - Left ventricle: The cavity size was  mildly dilated. Wall thickness was increased in a pattern of mild LVH. Systolic function was moderately to severely reduced. The estimated ejection fraction was in the range of 30% to 35%. There is akinesis of the anteroseptal and apical myocardium. Features are consistent with a pseudonormal left ventricular filling pattern, with concomitant abnormal relaxation and increased filling pressure (grade 2 diastolic dysfunction). Doppler parameters are consistent with high ventricular filling  pressure. - Mitral valve: Calcified annulus. There was mild regurgitation. - Left atrium: The atrium was mildly dilated. - Pulmonary arteries: Systolic pressure was mildly increased.   NO NEW EXAMS   LABS REVIEWED: she will decline labs PREVIOUS   11-08-16: wbc 5.5; hgb 9.9; hct 31.2; mcv 99.3; plt 196; glucose 95; bun 71,2; creat 6.55; k+ 5.0; na++ 137; liver normal albumin 4.0; hgb a1c 5.4   04-06-17: wbc 5.1; hgb 11.3; hct 34.2 mcv 96.3; plt 208; tsh 0.8; hgb a1c 5.5 chol 197; ldl 130; trig 51; hdl 57  08-01-17: wbc 7.0; hgb 11.0; hct 33.3; mcv 97.1; plt 247; glucose 95; bun 64.2; creat 5.86; k+ 5.5; na++ 132; liver normal albumin 4.5  NO NEW LABS.    Review of Systems  Unable to perform ROS: Other (nonverbal )    Physical Exam  Constitutional: She appears well-developed and well-nourished. No distress.  Cardiovascular: Normal rate, regular rhythm and intact distal pulses.  Murmur heard. Pulmonary/Chest: Effort normal and breath sounds normal. No respiratory distress.  Abdominal: Soft. Bowel sounds are normal. She exhibits no distension. There is no tenderness.  Musculoskeletal: Normal range of motion. She exhibits no edema.  Is status post bilateral transmetatarsal amputation   Neurological: She is alert.  Skin: Skin is warm and dry. She is not diaphoretic.  Left upper arm A/V fistula + thrill +bruitt      ASSESSMENT/ PLAN:  TODAY:   1. Sustained V tach: not a candidate for ICD. Is on amiodarone  200 mg daily for heart rate control will monitor   2. FTT: her current weight is 149 pounds; her albumin is 4.0. Will continue supplements per facility protocol.   3. Psychosis; paranoid : will continue abilify 10 mg daily . Will monitor   She does not tolerate any changes to her abilify when this occurs she will become more psychotic   4. Constipation: will continue miralax daily    PREVIOUS    5. Diabetes: hgb a1c 5.5  is presently not on medications; will not make changes will monitor her status.   6. Dyslipidemia: is presently not on medications; ldl 130  7. Anemia: hgb 11.3; will monitor   8. ESRD on hemodialysis: is followed by nephrology. Will continue dialysis as scheduled. Will continue phoslo 667 mg 3 tabs three times daily on Sunday; Tuesday; Thursday; Saturday; give 3 tabs twice daily on Monday; Wednesday; Friday.   9. Hypertension: 144/78 is presently stable will continue toprol xl 50 mg daily   10. PAD: is without change is status post left and right transmetatarsal amputation: will continue plavix 75 mg daily  Will continue ultram 50 mg daily for pain management   11. Combine systolic and diastolic heart failure: has ischemic cardiomyopathy  EF is 30-35%  (04-16-15); is presently stable will continue toprol xl 50 mg daily    MD is aware of resident's narcotic use and is in agreement with current plan of care. We will attempt to wean resident as apropriate    Ok Edwards NP [facility]  Contact [phone] Monday through Friday 8am- 5pm  After hours call [phone]

## 2017-10-10 DIAGNOSIS — N2581 Secondary hyperparathyroidism of renal origin: Secondary | ICD-10-CM | POA: Diagnosis not present

## 2017-10-10 DIAGNOSIS — E1129 Type 2 diabetes mellitus with other diabetic kidney complication: Secondary | ICD-10-CM | POA: Diagnosis not present

## 2017-10-10 DIAGNOSIS — N186 End stage renal disease: Secondary | ICD-10-CM | POA: Diagnosis not present

## 2017-10-10 DIAGNOSIS — D631 Anemia in chronic kidney disease: Secondary | ICD-10-CM | POA: Diagnosis not present

## 2017-10-10 DIAGNOSIS — Z23 Encounter for immunization: Secondary | ICD-10-CM | POA: Diagnosis not present

## 2017-10-10 DIAGNOSIS — Z283 Underimmunization status: Secondary | ICD-10-CM | POA: Diagnosis not present

## 2017-10-12 DIAGNOSIS — E1129 Type 2 diabetes mellitus with other diabetic kidney complication: Secondary | ICD-10-CM | POA: Diagnosis not present

## 2017-10-12 DIAGNOSIS — Z283 Underimmunization status: Secondary | ICD-10-CM | POA: Diagnosis not present

## 2017-10-12 DIAGNOSIS — N2581 Secondary hyperparathyroidism of renal origin: Secondary | ICD-10-CM | POA: Diagnosis not present

## 2017-10-12 DIAGNOSIS — N186 End stage renal disease: Secondary | ICD-10-CM | POA: Diagnosis not present

## 2017-10-12 DIAGNOSIS — Z23 Encounter for immunization: Secondary | ICD-10-CM | POA: Diagnosis not present

## 2017-10-12 DIAGNOSIS — D631 Anemia in chronic kidney disease: Secondary | ICD-10-CM | POA: Diagnosis not present

## 2017-10-15 DIAGNOSIS — Z23 Encounter for immunization: Secondary | ICD-10-CM | POA: Diagnosis not present

## 2017-10-15 DIAGNOSIS — E1129 Type 2 diabetes mellitus with other diabetic kidney complication: Secondary | ICD-10-CM | POA: Diagnosis not present

## 2017-10-15 DIAGNOSIS — N2581 Secondary hyperparathyroidism of renal origin: Secondary | ICD-10-CM | POA: Diagnosis not present

## 2017-10-15 DIAGNOSIS — N186 End stage renal disease: Secondary | ICD-10-CM | POA: Diagnosis not present

## 2017-10-15 DIAGNOSIS — D631 Anemia in chronic kidney disease: Secondary | ICD-10-CM | POA: Diagnosis not present

## 2017-10-15 DIAGNOSIS — Z283 Underimmunization status: Secondary | ICD-10-CM | POA: Diagnosis not present

## 2017-10-17 DIAGNOSIS — E1129 Type 2 diabetes mellitus with other diabetic kidney complication: Secondary | ICD-10-CM | POA: Diagnosis not present

## 2017-10-17 DIAGNOSIS — D631 Anemia in chronic kidney disease: Secondary | ICD-10-CM | POA: Diagnosis not present

## 2017-10-17 DIAGNOSIS — Z283 Underimmunization status: Secondary | ICD-10-CM | POA: Diagnosis not present

## 2017-10-17 DIAGNOSIS — N2581 Secondary hyperparathyroidism of renal origin: Secondary | ICD-10-CM | POA: Diagnosis not present

## 2017-10-17 DIAGNOSIS — N186 End stage renal disease: Secondary | ICD-10-CM | POA: Diagnosis not present

## 2017-10-17 DIAGNOSIS — Z23 Encounter for immunization: Secondary | ICD-10-CM | POA: Diagnosis not present

## 2017-10-19 DIAGNOSIS — N2581 Secondary hyperparathyroidism of renal origin: Secondary | ICD-10-CM | POA: Diagnosis not present

## 2017-10-19 DIAGNOSIS — D631 Anemia in chronic kidney disease: Secondary | ICD-10-CM | POA: Diagnosis not present

## 2017-10-19 DIAGNOSIS — E1129 Type 2 diabetes mellitus with other diabetic kidney complication: Secondary | ICD-10-CM | POA: Diagnosis not present

## 2017-10-19 DIAGNOSIS — N186 End stage renal disease: Secondary | ICD-10-CM | POA: Diagnosis not present

## 2017-10-19 DIAGNOSIS — Z23 Encounter for immunization: Secondary | ICD-10-CM | POA: Diagnosis not present

## 2017-10-19 DIAGNOSIS — Z283 Underimmunization status: Secondary | ICD-10-CM | POA: Diagnosis not present

## 2017-10-22 DIAGNOSIS — D631 Anemia in chronic kidney disease: Secondary | ICD-10-CM | POA: Diagnosis not present

## 2017-10-22 DIAGNOSIS — N2581 Secondary hyperparathyroidism of renal origin: Secondary | ICD-10-CM | POA: Diagnosis not present

## 2017-10-22 DIAGNOSIS — Z283 Underimmunization status: Secondary | ICD-10-CM | POA: Diagnosis not present

## 2017-10-22 DIAGNOSIS — E1129 Type 2 diabetes mellitus with other diabetic kidney complication: Secondary | ICD-10-CM | POA: Diagnosis not present

## 2017-10-22 DIAGNOSIS — N186 End stage renal disease: Secondary | ICD-10-CM | POA: Diagnosis not present

## 2017-10-22 DIAGNOSIS — Z23 Encounter for immunization: Secondary | ICD-10-CM | POA: Diagnosis not present

## 2017-10-24 DIAGNOSIS — D631 Anemia in chronic kidney disease: Secondary | ICD-10-CM | POA: Diagnosis not present

## 2017-10-24 DIAGNOSIS — Z23 Encounter for immunization: Secondary | ICD-10-CM | POA: Diagnosis not present

## 2017-10-24 DIAGNOSIS — Z283 Underimmunization status: Secondary | ICD-10-CM | POA: Diagnosis not present

## 2017-10-24 DIAGNOSIS — Z992 Dependence on renal dialysis: Secondary | ICD-10-CM | POA: Diagnosis not present

## 2017-10-24 DIAGNOSIS — N2581 Secondary hyperparathyroidism of renal origin: Secondary | ICD-10-CM | POA: Diagnosis not present

## 2017-10-24 DIAGNOSIS — N186 End stage renal disease: Secondary | ICD-10-CM | POA: Diagnosis not present

## 2017-10-24 DIAGNOSIS — E1129 Type 2 diabetes mellitus with other diabetic kidney complication: Secondary | ICD-10-CM | POA: Diagnosis not present

## 2017-10-26 DIAGNOSIS — N2581 Secondary hyperparathyroidism of renal origin: Secondary | ICD-10-CM | POA: Diagnosis not present

## 2017-10-26 DIAGNOSIS — Z992 Dependence on renal dialysis: Secondary | ICD-10-CM | POA: Diagnosis not present

## 2017-10-26 DIAGNOSIS — E1129 Type 2 diabetes mellitus with other diabetic kidney complication: Secondary | ICD-10-CM | POA: Diagnosis not present

## 2017-10-26 DIAGNOSIS — D631 Anemia in chronic kidney disease: Secondary | ICD-10-CM | POA: Diagnosis not present

## 2017-10-26 DIAGNOSIS — N186 End stage renal disease: Secondary | ICD-10-CM | POA: Diagnosis not present

## 2017-10-29 DIAGNOSIS — N186 End stage renal disease: Secondary | ICD-10-CM | POA: Diagnosis not present

## 2017-10-29 DIAGNOSIS — Z992 Dependence on renal dialysis: Secondary | ICD-10-CM | POA: Diagnosis not present

## 2017-10-29 DIAGNOSIS — D631 Anemia in chronic kidney disease: Secondary | ICD-10-CM | POA: Diagnosis not present

## 2017-10-29 DIAGNOSIS — E1129 Type 2 diabetes mellitus with other diabetic kidney complication: Secondary | ICD-10-CM | POA: Diagnosis not present

## 2017-10-29 DIAGNOSIS — N2581 Secondary hyperparathyroidism of renal origin: Secondary | ICD-10-CM | POA: Diagnosis not present

## 2017-10-31 DIAGNOSIS — N186 End stage renal disease: Secondary | ICD-10-CM | POA: Diagnosis not present

## 2017-10-31 DIAGNOSIS — E1129 Type 2 diabetes mellitus with other diabetic kidney complication: Secondary | ICD-10-CM | POA: Diagnosis not present

## 2017-10-31 DIAGNOSIS — N2581 Secondary hyperparathyroidism of renal origin: Secondary | ICD-10-CM | POA: Diagnosis not present

## 2017-10-31 DIAGNOSIS — D631 Anemia in chronic kidney disease: Secondary | ICD-10-CM | POA: Diagnosis not present

## 2017-10-31 DIAGNOSIS — Z992 Dependence on renal dialysis: Secondary | ICD-10-CM | POA: Diagnosis not present

## 2017-11-02 DIAGNOSIS — E1129 Type 2 diabetes mellitus with other diabetic kidney complication: Secondary | ICD-10-CM | POA: Diagnosis not present

## 2017-11-02 DIAGNOSIS — Z992 Dependence on renal dialysis: Secondary | ICD-10-CM | POA: Diagnosis not present

## 2017-11-02 DIAGNOSIS — N2581 Secondary hyperparathyroidism of renal origin: Secondary | ICD-10-CM | POA: Diagnosis not present

## 2017-11-02 DIAGNOSIS — D631 Anemia in chronic kidney disease: Secondary | ICD-10-CM | POA: Diagnosis not present

## 2017-11-02 DIAGNOSIS — N186 End stage renal disease: Secondary | ICD-10-CM | POA: Diagnosis not present

## 2017-11-05 DIAGNOSIS — N186 End stage renal disease: Secondary | ICD-10-CM | POA: Diagnosis not present

## 2017-11-05 DIAGNOSIS — D631 Anemia in chronic kidney disease: Secondary | ICD-10-CM | POA: Diagnosis not present

## 2017-11-05 DIAGNOSIS — N2581 Secondary hyperparathyroidism of renal origin: Secondary | ICD-10-CM | POA: Diagnosis not present

## 2017-11-05 DIAGNOSIS — E1129 Type 2 diabetes mellitus with other diabetic kidney complication: Secondary | ICD-10-CM | POA: Diagnosis not present

## 2017-11-05 DIAGNOSIS — Z992 Dependence on renal dialysis: Secondary | ICD-10-CM | POA: Diagnosis not present

## 2017-11-07 DIAGNOSIS — E1129 Type 2 diabetes mellitus with other diabetic kidney complication: Secondary | ICD-10-CM | POA: Diagnosis not present

## 2017-11-07 DIAGNOSIS — D631 Anemia in chronic kidney disease: Secondary | ICD-10-CM | POA: Diagnosis not present

## 2017-11-07 DIAGNOSIS — N186 End stage renal disease: Secondary | ICD-10-CM | POA: Diagnosis not present

## 2017-11-07 DIAGNOSIS — N2581 Secondary hyperparathyroidism of renal origin: Secondary | ICD-10-CM | POA: Diagnosis not present

## 2017-11-07 DIAGNOSIS — Z992 Dependence on renal dialysis: Secondary | ICD-10-CM | POA: Diagnosis not present

## 2017-11-09 ENCOUNTER — Encounter: Payer: Self-pay | Admitting: Adult Health

## 2017-11-09 ENCOUNTER — Non-Acute Institutional Stay (SKILLED_NURSING_FACILITY): Payer: Medicare Other | Admitting: Adult Health

## 2017-11-09 DIAGNOSIS — E1169 Type 2 diabetes mellitus with other specified complication: Secondary | ICD-10-CM | POA: Diagnosis not present

## 2017-11-09 DIAGNOSIS — Z992 Dependence on renal dialysis: Secondary | ICD-10-CM | POA: Diagnosis not present

## 2017-11-09 DIAGNOSIS — D631 Anemia in chronic kidney disease: Secondary | ICD-10-CM | POA: Diagnosis not present

## 2017-11-09 DIAGNOSIS — N2581 Secondary hyperparathyroidism of renal origin: Secondary | ICD-10-CM | POA: Diagnosis not present

## 2017-11-09 DIAGNOSIS — E785 Hyperlipidemia, unspecified: Secondary | ICD-10-CM

## 2017-11-09 DIAGNOSIS — N186 End stage renal disease: Secondary | ICD-10-CM

## 2017-11-09 DIAGNOSIS — E1129 Type 2 diabetes mellitus with other diabetic kidney complication: Secondary | ICD-10-CM | POA: Diagnosis not present

## 2017-11-09 DIAGNOSIS — E1122 Type 2 diabetes mellitus with diabetic chronic kidney disease: Secondary | ICD-10-CM | POA: Diagnosis not present

## 2017-11-09 NOTE — Progress Notes (Signed)
Location:   [facility] Room Number: 202 A Place of Service:  SNF (31)    CODE STATUS:  Full Code  No Known Allergies  Chief Complaint  Patient presents with  . Medical Management of Chronic Issues    Diabetes; dyslipidemia; anemia; esrd     HPI:  She is a 63 [facility] term resident of this facility being seen for the management of her chronic illnesses: diabetes mellitus with ESRD; dyslipidemia associated with type 2 diabetes; esrd and anemia in ckd. She is unable to participate in the hpi or ros. There are no reports of any changes in behavioral issues; no reports of changes in appetite or sleep disturbances. There are no nursing concerns at this time.   Past Medical History:  Diagnosis Date  . Anemia    Due to fibroids.  . Bilateral leg edema    a. Chronic.  Marland Kitchen Blind   . Cardiac arrest ([place]) 12/12/2014  . CHF (congestive heart failure) ([place])   . Coronary artery disease   . Deaf    USE SIGN INTERPRETER.  Divorced from husband but lives with him. Has daughter but she does not care for her.  . Diabetes mellitus    a. Per PCP note 2012 (A1C 9.2) - pt unwilling to take meds and was educated on risk of uncontrolled DM.  b. A1C 5.9 in 11/2013.  Marland Kitchen ESRD on hemodialysis ([place])    Mon, Wed, Fri  . Essential hypertension 08/09/2009   Qualifier: Diagnosis of  By: Sarita Haver  MD, Coralyn Mark    . Fibroid uterus   . Heart murmur   . Hyperlipidemia   . Hypertension    a. Has previously refused blood pressure meds.   . Leiomyoma of uterus 08/09/2009   Qualifier: Diagnosis of  By: Sarita Haver  MD, Coralyn Mark    . MI (myocardial infarction) ([place]) 11/2014  . Moderate tricuspid regurgitation   . Osteomyelitis ([place])   . Psychiatric disorder    She frequently exhibits paranoia and has been diagnosed with psychotic d/o NOS during hospital stay in the past. She is tangetial and perseverative during her visits. She apparently has had a bad experience with mental health in [place] in the past and refuses to  discuss mental issues for fear that she will be sent back there. Her paranoia, communication issues, financial woes and lack of fam  . Pulmonary hypertension ([place])     Past Surgical History:  Procedure Laterality Date  . Amputation Bilateral Great Toes MTP Joint Bilateral 04/02/2015   Performed by Newt Minion, MD at [facility]  . ARTERIOVENOUS (AV) FISTULA CREATION Left 01/02/2014   Performed by Rosetta Posner, MD at [facility]  . INSERTION OF DIALYSIS CATHETER Right 01/02/2014   Performed by Rosetta Posner, MD at [facility]  . Left Transmetatarsal Amputation Left 05/14/2015   Performed by Newt Minion, MD at [facility]  . NO PAST SURGERIES    . OPEN REDUCTION INTERNAL (ORIF) FIXATION PATELLA Left 05/17/2015   Performed by Newt Minion, MD at [facility]  . Transmetatarsal Amputation Right Foot Right 08/20/2015   Performed by Newt Minion, MD at [facility] History   Socioeconomic History  . Marital status: Widowed    Spouse name: Not on file  . Number of children: Not on file  . Years of education: Not on file  . Highest education level: Not on file  Social Needs  . Financial resource strain:  Not on file  . Food insecurity - worry: Not on file  . Food insecurity - inability: Not on file  . Transportation needs - medical: Not on file  . Transportation needs - non-medical: Not on file  Occupational History  . Not on file  Tobacco Use  . Smoking status: Never Smoker  . Smokeless tobacco: Never Used  . Tobacco comment: Prior dip  Substance and Sexual Activity  . Alcohol use: No  . Drug use: No  . Sexual activity: Not on file  Other Topics Concern  . Not on file  Social History Narrative  . Not on file   Family History  Problem Relation Age of Onset  . Other Father        Drowned from fishing?  . Heart disease Unknown       VITAL SIGNS BP 122/74   Pulse 68   Temp 97.7 F (36.5 C)   Resp 18   Ht 5\' 5"  (1.651 m)   Wt 150 lb 14.4 oz (68.4 kg)   SpO2 98%   BMI 25.11 kg/m       Medication List        Accurate as of 11/09/17  2:49 PM. Always use your most recent med list.          amiodarone 200 MG tablet Commonly known as:  PACERONE   ARIPiprazole 10 MG tablet Commonly known as:  ABILIFY   calcium acetate 667 MG tablet Commonly known as:  PHOSLO   clopidogrel 75 MG tablet Commonly known as:  PLAVIX   feeding supplement (PRO-STAT SUGAR FREE 64) Liqd   metoprolol succinate 50 MG 24 hr tablet Commonly known as:  TOPROL-XL   nitroGLYCERIN 0.4 MG SL tablet Commonly known as:  NITROSTAT   polyethylene glycol packet Commonly known as:  MIRALAX / GLYCOLAX   traMADol 50 MG tablet Commonly known as:  ULTRAM Take 1 tablet (50 mg total) by mouth daily.        SIGNIFICANT DIAGNOSTIC EXAMS  PREVIOUS  11-24-14: Myoview: 1. Fixed defects involving the apex, anterior lateral wall and the inferior wall. The apex and anterior lateral wall fixed defects aresuggestive for an infarct. No evidence for reversibility or ischemia. 2. Diffuse hypokinesia and minimal wall motion along the lateral wall. 3. Left ventricular ejection fraction is 35%. 4. High-risk stress test findings*.  04-16-15: 2-d echo: - Left ventricle: The cavity size was mildly dilated. Wall thickness was increased in a pattern of mild LVH. Systolic function was moderately to severely reduced. The estimated ejection fraction was in the range of 30% to 35%. There is akinesis of the anteroseptal and apical myocardium. Features are consistent with a pseudonormal left ventricular filling pattern, with concomitant abnormal relaxation and increased filling pressure (grade 2 diastolic dysfunction). Doppler parameters are consistent with high ventricular filling pressure. - Mitral valve: Calcified annulus. There was mild regurgitation. - Left atrium: The atrium was mildly dilated. - Pulmonary arteries: Systolic pressure was mildly increased.   NO NEW EXAMS   LABS REVIEWED: she will decline labs  PREVIOUS   11-08-16: wbc 5.5; hgb 9.9; hct 31.2; mcv 99.3; plt 196; glucose 95; bun 71,2; creat 6.55; k+ 5.0; na++ 137; liver normal albumin 4.0; hgb a1c 5.4   04-06-17: wbc 5.1; hgb 11.3; hct 34.2 mcv 96.3; plt 208; tsh 0.8; hgb a1c 5.5 chol 197; ldl 130; trig 51; hdl 57  08-01-17: wbc 7.0; hgb 11.0; hct 33.3; mcv 97.1; plt 247; glucose 95; bun 64.2; creat 5.86;  k+ 5.5; na++ 132; liver normal albumin 4.5  NO NEW LABS.     Review of Systems  Unable to perform ROS: Other (is deaf and blind )    Physical Exam  Constitutional: She appears well-developed and well-nourished. No distress.  Neck: Neck supple. No thyromegaly present.  Cardiovascular: Normal rate, regular rhythm and intact distal pulses.  Murmur heard. 1/6  Pulmonary/Chest: Effort normal and breath sounds normal. No respiratory distress.  Abdominal: Soft. Bowel sounds are normal. She exhibits no distension. There is no tenderness.  Musculoskeletal: Normal range of motion. She exhibits no edema.  Is status post bilateral transmetatarsal amputations   Lymphadenopathy:    She has no cervical adenopathy.  Neurological: She is alert.  Skin: Skin is warm and dry. She is not diaphoretic.  Left upper arm A/V fistula + thrill +bruitt      Psychiatric: She has a normal mood and affect.    Physical Exam  Constitutional: She appears well-developed and well-nourished. No distress.  Cardiovascular: Normal rate, regular rhythm and intact distal pulses.  Murmur heard. Pulmonary/Chest: Effort normal and breath sounds normal. No respiratory distress.  Abdominal: Soft. Bowel sounds are normal. She exhibits no distension. There is no tenderness.  Musculoskeletal: Normal range of motion. She exhibits no edema.  Is status post bilateral transmetatarsal amputation   Neurological: She is alert.  Skin: Skin is warm and dry. She is not diaphoretic.  Left upper arm A/V fistula + thrill +bruitt      ASSESSMENT/ PLAN:  TODAY:   1. Diabetes:  hgb a1c 5.5  is presently not on medications; will not make changes will monitor her status.   2. Dyslipidemia: is presently not on medications; ldl 130  3. Anemia: hgb 11.3; will monitor   4. ESRD on hemodialysis: is followed by nephrology. Will continue dialysis as scheduled. Will continue phoslo 667 mg 3 tabs three times daily on Sunday; Tuesday; Thursday; Saturday; give 3 tabs twice daily on Monday; Wednesday; Friday.    PREVIOUS     5. Hypertension: 144/78 is presently stable will continue toprol xl 50 mg daily   6. PAD: is without change is status post left and right transmetatarsal amputation: will continue plavix 75 mg daily  Will continue ultram 50 mg daily for pain management   7. 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 Sustained V tach: not a candidate for ICD. Is on amiodarone 200 mg daily for heart rate control will monitor   9. FTT: her current weight is 149 pounds; her albumin is 4.0. Will continue supplements per facility protocol.   10. Psychosis; paranoid : will continue abilify 10 mg daily . Will monitor   She does not tolerate any changes to her abilify when this occurs she will become more psychotic   11. Constipation: will continue miralax daily   Will check cbc; cmp; tsh; hgb a1c lipids    MD is aware of resident's narcotic use and is in agreement with current plan of care. We will attempt to wean resident as apropriate     Ok Edwards NP [facility]  Contact [phone] Monday through Friday 8am- 5pm  After hours call [phone]

## 2017-11-10 DIAGNOSIS — I10 Essential (primary) hypertension: Secondary | ICD-10-CM | POA: Diagnosis not present

## 2017-11-10 DIAGNOSIS — E785 Hyperlipidemia, unspecified: Secondary | ICD-10-CM | POA: Diagnosis not present

## 2017-11-10 DIAGNOSIS — N186 End stage renal disease: Secondary | ICD-10-CM | POA: Diagnosis not present

## 2017-11-10 DIAGNOSIS — E039 Hypothyroidism, unspecified: Secondary | ICD-10-CM | POA: Diagnosis not present

## 2017-11-10 DIAGNOSIS — D649 Anemia, unspecified: Secondary | ICD-10-CM | POA: Diagnosis not present

## 2017-11-10 DIAGNOSIS — E119 Type 2 diabetes mellitus without complications: Secondary | ICD-10-CM | POA: Diagnosis not present

## 2017-11-10 LAB — CBC AND DIFFERENTIAL
HCT: 35 — AB (ref 36–46)
Hemoglobin: 10.9 — AB (ref 12.0–16.0)
Neutrophils Absolute: 4
Platelets: 275 (ref 150–399)
WBC: 6

## 2017-11-10 LAB — BASIC METABOLIC PANEL
BUN: 25 — AB (ref 4–21)
Creatinine: 1.1 (ref 0.5–1.1)
Glucose: 421
Potassium: 4.5 (ref 3.4–5.3)
Sodium: 143 (ref 137–147)

## 2017-11-10 LAB — HEPATIC FUNCTION PANEL
ALT: 9 (ref 7–35)
AST: 14 (ref 13–35)
Alkaline Phosphatase: 72 (ref 25–125)
Bilirubin, Total: 0.2

## 2017-11-10 LAB — LIPID PANEL
Cholesterol: 170 (ref 0–200)
HDL: 36 (ref 35–70)
LDL Cholesterol: 101
Triglycerides: 165 — AB (ref 40–160)

## 2017-11-11 DIAGNOSIS — N186 End stage renal disease: Secondary | ICD-10-CM | POA: Diagnosis not present

## 2017-11-11 DIAGNOSIS — D631 Anemia in chronic kidney disease: Secondary | ICD-10-CM | POA: Diagnosis not present

## 2017-11-11 DIAGNOSIS — Z992 Dependence on renal dialysis: Secondary | ICD-10-CM | POA: Diagnosis not present

## 2017-11-11 DIAGNOSIS — N2581 Secondary hyperparathyroidism of renal origin: Secondary | ICD-10-CM | POA: Diagnosis not present

## 2017-11-11 DIAGNOSIS — E1129 Type 2 diabetes mellitus with other diabetic kidney complication: Secondary | ICD-10-CM | POA: Diagnosis not present

## 2017-11-12 ENCOUNTER — Encounter: Payer: Self-pay | Admitting: Adult Health

## 2017-11-12 ENCOUNTER — Non-Acute Institutional Stay (SKILLED_NURSING_FACILITY): Payer: Medicare Other | Admitting: Adult Health

## 2017-11-12 DIAGNOSIS — E1122 Type 2 diabetes mellitus with diabetic chronic kidney disease: Secondary | ICD-10-CM | POA: Diagnosis not present

## 2017-11-12 DIAGNOSIS — E785 Hyperlipidemia, unspecified: Secondary | ICD-10-CM | POA: Diagnosis not present

## 2017-11-12 DIAGNOSIS — IMO0002 Reserved for concepts with insufficient information to code with codable children: Secondary | ICD-10-CM

## 2017-11-12 DIAGNOSIS — N186 End stage renal disease: Secondary | ICD-10-CM

## 2017-11-12 DIAGNOSIS — E1169 Type 2 diabetes mellitus with other specified complication: Secondary | ICD-10-CM | POA: Diagnosis not present

## 2017-11-12 DIAGNOSIS — E1165 Type 2 diabetes mellitus with hyperglycemia: Secondary | ICD-10-CM | POA: Diagnosis not present

## 2017-11-12 NOTE — Progress Notes (Signed)
Location:   [facility] Room Number: 202 A Place of Service:  SNF (31)   CODE STATUS: full Code  No Known Allergies  Chief Complaint  Patient presents with  . Acute Visit    Elevated Glucose reading    HPI:  She has been off her insulin for an extended period of time; as she did not want to take these medications. Her hgb a1c is now 10.8 and will require insulin therapy. She is unable to fully participate in the hpi or ros; but did understand that she will need insulin. Did indicated ok. There are no reports of excessive thirst or hunger; no behavioral issues. There are no nursing concerns at this time.  She will be seen for uncontrolled type 2 diabetes mellitus with end stage renal disease; dyslipidemia associated with type 2 diabetes; end stage renal disease   Past Medical History:  Diagnosis Date  . Anemia    Due to fibroids.  . Bilateral leg edema    a. Chronic.  Marland Kitchen Blind   . Cardiac arrest ([place]) 12/12/2014  . CHF (congestive heart failure) ([place])   . Coronary artery disease   . Deaf    USE SIGN INTERPRETER.  Divorced from husband but lives with him. Has daughter but she does not care for her.  . Diabetes mellitus    a. Per PCP note 2012 (A1C 9.2) - pt unwilling to take meds and was educated on risk of uncontrolled DM.  b. A1C 5.9 in 11/2013.  Marland Kitchen ESRD on hemodialysis ([place])    Mon, Wed, Fri  . Essential hypertension 08/09/2009   Qualifier: Diagnosis of  By: Sarita Haver  MD, Coralyn Mark    . Fibroid uterus   . Heart murmur   . Hyperlipidemia   . Hypertension    a. Has previously refused blood pressure meds.   . Leiomyoma of uterus 08/09/2009   Qualifier: Diagnosis of  By: Sarita Haver  MD, Coralyn Mark    . MI (myocardial infarction) ([place]) 11/2014  . Moderate tricuspid regurgitation   . Osteomyelitis ([place])   . Psychiatric disorder    She frequently exhibits paranoia and has been diagnosed with psychotic d/o NOS during hospital stay in the past. She is tangetial and  perseverative during her visits. She apparently has had a bad experience with mental health in [place] in the past and refuses to discuss mental issues for fear that she will be sent back there. Her paranoia, communication issues, financial woes and lack of fam  . Pulmonary hypertension ([place])     Past Surgical History:  Procedure Laterality Date  . Amputation Bilateral Great Toes MTP Joint Bilateral 04/02/2015   Performed by Newt Minion, MD at [facility]  . ARTERIOVENOUS (AV) FISTULA CREATION Left 01/02/2014   Performed by Rosetta Posner, MD at [facility]  . INSERTION OF DIALYSIS CATHETER Right 01/02/2014   Performed by Rosetta Posner, MD at [facility]  . Left Transmetatarsal Amputation Left 05/14/2015   Performed by Newt Minion, MD at [facility]  . NO PAST SURGERIES    . OPEN REDUCTION INTERNAL (ORIF) FIXATION PATELLA Left 05/17/2015   Performed by Newt Minion, MD at [facility]  . Transmetatarsal Amputation Right Foot Right 08/20/2015   Performed by Newt Minion, MD at [facility] History   Socioeconomic History  . Marital status: Widowed    Spouse name: Not on file  . Number of children: Not on file  .  Years of education: Not on file  . Highest education level: Not on file  Social Needs  . Financial resource strain: Not on file  . Food insecurity - worry: Not on file  . Food insecurity - inability: Not on file  . Transportation needs - medical: Not on file  . Transportation needs - non-medical: Not on file  Occupational History  . Not on file  Tobacco Use  . Smoking status: Never Smoker  . Smokeless tobacco: Never Used  . Tobacco comment: Prior dip  Substance and Sexual Activity  . Alcohol use: No  . Drug use: No  . Sexual activity: Not on file  Other Topics Concern  . Not on file  Social History Narrative  . Not on file   Family History  Problem Relation Age of Onset  . Other Father        Drowned from fishing?  . Heart disease Unknown       VITAL SIGNS BP 140/80   Pulse 68    Temp 98.7 F (37.1 C)   Resp 16   Ht 5\' 5"  (1.651 m)   Wt 150 lb 14.4 oz (68.4 kg)   SpO2 98%   BMI 25.11 kg/m   Outpatient Encounter Medications as of 11/12/2017  Medication Sig  . Amino Acids-Protein Hydrolys (FEEDING SUPPLEMENT, PRO-STAT SUGAR FREE 64,) LIQD Take 60 mLs by mouth 2 (two) times daily.  Marland Kitchen amiodarone (PACERONE) 200 MG tablet Take 200 mg by mouth every evening.   . ARIPiprazole (ABILIFY) 10 MG tablet Take 10 mg by mouth at bedtime.   . calcium acetate (PHOSLO) 667 MG tablet Take 3 tablets by mouth before meals every Sunday, Tuesday, Thursday and Saturday for Dialysis.  Give 3 tablets by mouth two times a day every Mon, Wed, Friday for renal buffer  . clopidogrel (PLAVIX) 75 MG tablet Take 75 mg by mouth every evening.  . metoprolol succinate (TOPROL-XL) 50 MG 24 hr tablet Take 50 mg by mouth every evening. Take with or immediately following a meal.  . nitroGLYCERIN (NITROSTAT) 0.4 MG SL tablet Place 0.4 mg under the tongue every 5 (five) minutes as needed for chest pain.  . polyethylene glycol (MIRALAX / GLYCOLAX) packet Take 17 g by mouth daily as needed.   . traMADol (ULTRAM) 50 MG tablet Take 1 tablet (50 mg total) by mouth daily.   No facility-administered encounter medications on file as of 11/12/2017.      SIGNIFICANT DIAGNOSTIC EXAMS  PREVIOUS  11-24-14: Myoview: 1. Fixed defects involving the apex, anterior lateral wall and the inferior wall. The apex and anterior lateral wall fixed defects aresuggestive for an infarct. No evidence for reversibility or ischemia. 2. Diffuse hypokinesia and minimal wall motion along the lateral wall. 3. Left ventricular ejection fraction is 35%. 4. High-risk stress test findings*.  04-16-15: 2-d echo: - Left ventricle: The cavity size was mildly dilated. Wall thickness was increased in a pattern of mild LVH. Systolic function was moderately to severely reduced. The estimated ejection fraction was in the range of 30% to 35%. There  is akinesis of the anteroseptal and apical myocardium. Features are consistent with a pseudonormal left ventricular filling pattern, with concomitant abnormal relaxation and increased filling pressure (grade 2 diastolic dysfunction). Doppler parameters are consistent with high ventricular filling pressure. - Mitral valve: Calcified annulus. There was mild regurgitation. - Left atrium: The atrium was mildly dilated. - Pulmonary arteries: Systolic pressure was mildly increased.   NO NEW EXAMS   LABS  REVIEWED: she will decline labs PREVIOUS   11-08-16: wbc 5.5; hgb 9.9; hct 31.2; mcv 99.3; plt 196; glucose 95; bun 71,2; creat 6.55; k+ 5.0; na++ 137; liver normal albumin 4.0; hgb a1c 5.4   04-06-17: wbc 5.1; hgb 11.3; hct 34.2 mcv 96.3; plt 208; tsh 0.8; hgb a1c 5.5 chol 197; ldl 130; trig 51; hdl 57  08-01-17: wbc 7.0; hgb 11.0; hct 33.3; mcv 97.1; plt 247; glucose 95; bun 64.2; creat 5.86; k+ 5.5; na++ 132; liver normal albumin 4.5  TODAY:   11-10-17: wbc 6.0; hgb 10.9; hct 34.7; mcv 88.8; plt 275; glucose 421; bun 25.3; creat 1.11; k+ 4.5; na++ 143; ca 8.6; liver normal albumin 3.1; tsh 3.07; hgb a1c 10.8; chol 170; ldl 101; trig 16; hdl 36    Review of Systems  Unable to perform ROS: Other (deaf and blind )   Physical Exam  Constitutional: She appears well-developed and well-nourished. No distress.  Neck: Neck supple. No thyromegaly present.  Cardiovascular: Normal rate, regular rhythm and intact distal pulses.  Murmur heard. 1/6  Pulmonary/Chest: Effort normal and breath sounds normal. No respiratory distress.  Abdominal: Soft. Bowel sounds are normal. She exhibits no distension. There is no tenderness.  Musculoskeletal: She exhibits no edema.  Status post bilateral transmetatarsal amputations  Is able to move all extremities   Lymphadenopathy:    She has no cervical adenopathy.  Neurological: She is alert.  Skin: Skin is warm and dry. She is not diaphoretic.  Left upper arm a/v  fistula: + thrill + bruitt   Psychiatric: She has a normal mood and affect.    ASSESSMENT/ PLAN:  TODAY:   1. Diabetes: worse hgb a1c 10.8 (previous 5.5)  Will begin lantus 10 units; novolog 5 units after meals will check cbg twice daily   2. Dyslipidemia:  Is worse; ldl 165; will begin lipitor 10 mg daily    3. ESRD on hemodialysis: is followed by nephrology. Will continue dialysis as scheduled. Will continue phoslo 667 mg 3 tabs three times daily on Sunday; Tuesday; Thursday; Saturday; give 3 tabs twice daily on Monday; Wednesday; Friday.       MD is aware of resident's narcotic use and is in agreement with current plan of care. We will attempt to wean resident as apropriate   Ok Edwards NP [facility]  Contact [phone] Monday through Friday 8am- 5pm  After hours call [phone]

## 2017-11-13 DIAGNOSIS — D631 Anemia in chronic kidney disease: Secondary | ICD-10-CM | POA: Diagnosis not present

## 2017-11-13 DIAGNOSIS — E1129 Type 2 diabetes mellitus with other diabetic kidney complication: Secondary | ICD-10-CM | POA: Diagnosis not present

## 2017-11-13 DIAGNOSIS — Z992 Dependence on renal dialysis: Secondary | ICD-10-CM | POA: Diagnosis not present

## 2017-11-13 DIAGNOSIS — N2581 Secondary hyperparathyroidism of renal origin: Secondary | ICD-10-CM | POA: Diagnosis not present

## 2017-11-13 DIAGNOSIS — N186 End stage renal disease: Secondary | ICD-10-CM | POA: Diagnosis not present

## 2017-11-16 DIAGNOSIS — Z992 Dependence on renal dialysis: Secondary | ICD-10-CM | POA: Diagnosis not present

## 2017-11-16 DIAGNOSIS — N2581 Secondary hyperparathyroidism of renal origin: Secondary | ICD-10-CM | POA: Diagnosis not present

## 2017-11-16 DIAGNOSIS — D631 Anemia in chronic kidney disease: Secondary | ICD-10-CM | POA: Diagnosis not present

## 2017-11-16 DIAGNOSIS — E1129 Type 2 diabetes mellitus with other diabetic kidney complication: Secondary | ICD-10-CM | POA: Diagnosis not present

## 2017-11-16 DIAGNOSIS — N186 End stage renal disease: Secondary | ICD-10-CM | POA: Diagnosis not present

## 2017-11-19 ENCOUNTER — Encounter: Payer: Self-pay | Admitting: Adult Health

## 2017-11-19 ENCOUNTER — Non-Acute Institutional Stay (SKILLED_NURSING_FACILITY): Payer: Medicare Other | Admitting: Adult Health

## 2017-11-19 ENCOUNTER — Other Ambulatory Visit: Payer: Self-pay

## 2017-11-19 DIAGNOSIS — E785 Hyperlipidemia, unspecified: Secondary | ICD-10-CM | POA: Diagnosis not present

## 2017-11-19 DIAGNOSIS — Z992 Dependence on renal dialysis: Secondary | ICD-10-CM | POA: Diagnosis not present

## 2017-11-19 DIAGNOSIS — E1165 Type 2 diabetes mellitus with hyperglycemia: Secondary | ICD-10-CM | POA: Diagnosis not present

## 2017-11-19 DIAGNOSIS — D631 Anemia in chronic kidney disease: Secondary | ICD-10-CM | POA: Diagnosis not present

## 2017-11-19 DIAGNOSIS — N186 End stage renal disease: Secondary | ICD-10-CM | POA: Diagnosis not present

## 2017-11-19 DIAGNOSIS — E1129 Type 2 diabetes mellitus with other diabetic kidney complication: Secondary | ICD-10-CM | POA: Diagnosis not present

## 2017-11-19 DIAGNOSIS — E1169 Type 2 diabetes mellitus with other specified complication: Secondary | ICD-10-CM | POA: Diagnosis not present

## 2017-11-19 DIAGNOSIS — IMO0002 Reserved for concepts with insufficient information to code with codable children: Secondary | ICD-10-CM

## 2017-11-19 DIAGNOSIS — E1122 Type 2 diabetes mellitus with diabetic chronic kidney disease: Secondary | ICD-10-CM | POA: Diagnosis not present

## 2017-11-19 DIAGNOSIS — N2581 Secondary hyperparathyroidism of renal origin: Secondary | ICD-10-CM | POA: Diagnosis not present

## 2017-11-19 MED ORDER — TRAMADOL HCL 50 MG PO TABS: 50.0000 mg | ORAL_TABLET | Freq: Every day | ORAL | 0 refills | Status: DC

## 2017-11-19 NOTE — Telephone Encounter (Signed)
RX faxed to [facility] @ [phone], phone number [phone] 

## 2017-11-19 NOTE — Progress Notes (Signed)
Location:   [place] [facility] Room Number: 202 A Place of Service:  SNF (31)   CODE STATUS: Full Code  No Known Allergies  Chief Complaint  Patient presents with  . Acute Visit    Diabetes Mellitus    HPI:  Her hgb a1c 10.8 ldl is 165. Her cbgs are improving and she is tolerating her insulins without difficulty. She has been tolerating lipitor. There are no reports of hyper or hypoglycemia; there are no reports of muscle aches and pains. She is unable to fully participate in the hpi or ros. There are no nursing concerns at this time.   Past Medical History:  Diagnosis Date  . Anemia    Due to fibroids.  . Bilateral leg edema    a. Chronic.  Marland Kitchen Blind   . Cardiac arrest ([place]) 12/12/2014  . CHF (congestive heart failure) ([place])   . Coronary artery disease   . Deaf    USE SIGN INTERPRETER.  Divorced from husband but lives with him. Has daughter but she does not care for her.  . Diabetes mellitus    a. Per PCP note 2012 (A1C 9.2) - pt unwilling to take meds and was educated on risk of uncontrolled DM.  b. A1C 5.9 in 11/2013.  Marland Kitchen ESRD on hemodialysis ([place])    Mon, Wed, Fri  . Essential hypertension 08/09/2009   Qualifier: Diagnosis of  By: Sarita Haver  MD, Coralyn Mark    . Fibroid uterus   . Heart murmur   . Hyperlipidemia   . Hypertension    a. Has previously refused blood pressure meds.   . Leiomyoma of uterus 08/09/2009   Qualifier: Diagnosis of  By: Sarita Haver  MD, Coralyn Mark    . MI (myocardial infarction) ([place]) 11/2014  . Moderate tricuspid regurgitation   . Osteomyelitis ([place])   . Psychiatric disorder    She frequently exhibits paranoia and has been diagnosed with psychotic d/o NOS during hospital stay in the past. She is tangetial and perseverative during her visits. She apparently has had a bad experience with mental health in [place] in the past and refuses to discuss mental issues for fear that she will be sent back there. Her paranoia, communication issues, financial woes and lack  of fam  . Pulmonary hypertension ([place])     Past Surgical History:  Procedure Laterality Date  . AMPUTATION Bilateral 04/02/2015   Procedure: Amputation Bilateral Great Toes MTP Joint;  Surgeon: Newt Minion, MD;  Location: [facility];  Service: Orthopedics;  Laterality: Bilateral;  . AMPUTATION Left 05/14/2015   Procedure: Left Transmetatarsal Amputation;  Surgeon: Newt Minion, MD;  Location: [place];  Service: Orthopedics;  Laterality: Left;  . AMPUTATION Right 08/20/2015   Procedure: Transmetatarsal Amputation Right Foot;  Surgeon: Newt Minion, MD;  Location: [facility];  Service: Orthopedics;  Laterality: Right;  . AV FISTULA PLACEMENT Left 01/02/2014   Procedure: ARTERIOVENOUS (AV) FISTULA CREATION;  Surgeon: Rosetta Posner, MD;  Location: [place] [facility];  Service: Vascular;  Laterality: Left;  . INSERTION OF DIALYSIS CATHETER Right 01/02/2014   Procedure: INSERTION OF DIALYSIS CATHETER;  Surgeon: Rosetta Posner, MD;  Location: [facility];  Service: Vascular;  Laterality: Right;  . NO PAST SURGERIES    . ORIF PATELLA Left 05/17/2015   Procedure: OPEN REDUCTION INTERNAL (ORIF) FIXATION PATELLA;  Surgeon: Newt Minion, MD;  Location: [facility];  Service: Orthopedics;  Laterality: Left;    Social History   Socioeconomic History  . Marital status:  Widowed    Spouse name: Not on file  . Number of children: Not on file  . Years of education: Not on file  . Highest education level: Not on file  Social Needs  . Financial resource strain: Not on file  . Food insecurity - worry: Not on file  . Food insecurity - inability: Not on file  . Transportation needs - medical: Not on file  . Transportation needs - non-medical: Not on file  Occupational History  . Not on file  Tobacco Use  . Smoking status: Never Smoker  . Smokeless tobacco: Never Used  . Tobacco comment: Prior dip  Substance and Sexual Activity  . Alcohol use: No  . Drug use: No  . Sexual activity: Not on file  Other Topics Concern  . Not on file  Social  History Narrative  . Not on file   Family History  Problem Relation Age of Onset  . Other Father        Drowned from fishing?  . Heart disease Unknown       VITAL SIGNS BP 122/66   Pulse 77   Temp 98 F (36.7 C)   Resp 16   Ht 5\' 5"  (1.651 m)   Wt 150 lb 14.4 oz (68.4 kg)   SpO2 98%   BMI 25.11 kg/m   Outpatient Encounter Medications as of 11/19/2017  Medication Sig  . Amino Acids-Protein Hydrolys (FEEDING SUPPLEMENT, PRO-STAT SUGAR FREE 64,) LIQD Take 60 mLs by mouth 2 (two) times daily.  Marland Kitchen amiodarone (PACERONE) 200 MG tablet Take 200 mg by mouth every evening.   . ARIPiprazole (ABILIFY) 10 MG tablet Take 10 mg by mouth at bedtime.   Marland Kitchen atorvastatin (LIPITOR) 10 MG tablet Take 10 mg by mouth every evening.  . calcium acetate (PHOSLO) 667 MG tablet Take 3 tablets by mouth before meals every Sunday, Tuesday, Thursday and Saturday for Dialysis.  Give 3 tablets by mouth two times a day every Mon, Wed, Friday for renal buffer  . clopidogrel (PLAVIX) 75 MG tablet Take 75 mg by mouth every evening.  . insulin aspart (NOVOLOG) 100 UNIT/ML injection Inject as per sliding scale subcutaneously after meals 0 - 70 = 0 units, notify MD is BS is less than 70 71 - 450 = 5 units. Notify MD if BS is greater than 450  . insulin glargine (LANTUS) 100 UNIT/ML injection Inject 10 units subcutaneously at bedtime  . metoprolol succinate (TOPROL-XL) 50 MG 24 hr tablet Take 50 mg by mouth every evening. Take with or immediately following a meal.  . nitroGLYCERIN (NITROSTAT) 0.4 MG SL tablet Place 0.4 mg under the tongue every 5 (five) minutes as needed for chest pain.  . polyethylene glycol (MIRALAX / GLYCOLAX) packet Take 17 g by mouth daily.   . traMADol (ULTRAM) 50 MG tablet Take 1 tablet (50 mg total) by mouth daily.   No facility-administered encounter medications on file as of 11/19/2017.      SIGNIFICANT DIAGNOSTIC EXAMS  PREVIOUS  11-24-14: Myoview: 1. Fixed defects involving the apex,  anterior lateral wall and the inferior wall. The apex and anterior lateral wall fixed defects aresuggestive for an infarct. No evidence for reversibility or ischemia. 2. Diffuse hypokinesia and minimal wall motion along the lateral wall. 3. Left ventricular ejection fraction is 35%. 4. High-risk stress test findings*.  04-16-15: 2-d echo: - Left ventricle: The cavity size was mildly dilated. Wall thickness was increased in a pattern of mild LVH. Systolic function was  moderately to severely reduced. The estimated ejection fraction was in the range of 30% to 35%. There is akinesis of the anteroseptal and apical myocardium. Features are consistent with a pseudonormal left ventricular filling pattern, with concomitant abnormal relaxation and increased filling pressure (grade 2 diastolic dysfunction). Doppler parameters are consistent with high ventricular filling pressure. - Mitral valve: Calcified annulus. There was mild regurgitation. - Left atrium: The atrium was mildly dilated. - Pulmonary arteries: Systolic pressure was mildly increased.   NO NEW EXAMS   LABS REVIEWED: she will decline labs PREVIOUS   11-08-16: wbc 5.5; hgb 9.9; hct 31.2; mcv 99.3; plt 196; glucose 95; bun 71,2; creat 6.55; k+ 5.0; na++ 137; liver normal albumin 4.0; hgb a1c 5.4   04-06-17: wbc 5.1; hgb 11.3; hct 34.2 mcv 96.3; plt 208; tsh 0.8; hgb a1c 5.5 chol 197; ldl 130; trig 51; hdl 57  08-01-17: wbc 7.0; hgb 11.0; hct 33.3; mcv 97.1; plt 247; glucose 95; bun 64.2; creat 5.86; k+ 5.5; na++ 132; liver normal albumin 4.5 11-10-17: wbc 6.0; hgb 10.9; hct 34.7; mcv 88.8; plt 275; glucose 421; bun 25.3; creat 1.11; k+ 4.5; na++ 143; ca 8.6; liver normal albumin 3.1; tsh 3.07; hgb a1c 10.8; chol 170; ldl 101; trig 165; hdl 36   NO NEW LABS    Review of Systems  Unable to perform ROS: Other (deaf and blind )    Physical Exam  Constitutional: She appears well-developed. No distress.  Neck: Neck supple. No thyromegaly  present.  Cardiovascular: Normal rate, regular rhythm and intact distal pulses.  Murmur heard. 1/6  Pulmonary/Chest: Effort normal and breath sounds normal. No respiratory distress.  Abdominal: Soft. Bowel sounds are normal. She exhibits no distension. There is no tenderness.  Musculoskeletal: She exhibits edema.  Status post bilateral transmetatarsal amputations  Is able to move all extremities    Lymphadenopathy:    She has no cervical adenopathy.  Neurological: She is alert.  Skin: Skin is warm and dry. She is not diaphoretic.  Left upper arm a/v fistula: + thrill + bruitt    Psychiatric: She has a normal mood and affect.    ASSESSMENT/ PLAN:  TODAY:   1. Diabetes: without changes hgb a1c 10.8 (previous 5.5)  Will continue lantus 10 units; novolog 5 units after meals   She is on a stating; plavix and is esrd.    2. Dyslipidemia:  Is worse; ldl 165; will continue lipitor 10 mg daily    3. ESRD on hemodialysis: is followed by nephrology. Will continue dialysis as scheduled. Will continue phoslo 667 mg 3 tabs three times daily on Sunday; Tuesday; Thursday; Saturday; give 3 tabs twice daily on Monday; Wednesday; Friday.     MD is aware of resident's narcotic use and is in agreement with current plan of care. We will attempt to wean resident as apropriate   Ok Edwards NP [facility] Adult Medicine  Contact [phone] Monday through Friday 8am- 5pm  After hours call [phone]

## 2017-11-21 DIAGNOSIS — N2581 Secondary hyperparathyroidism of renal origin: Secondary | ICD-10-CM | POA: Diagnosis not present

## 2017-11-21 DIAGNOSIS — E1129 Type 2 diabetes mellitus with other diabetic kidney complication: Secondary | ICD-10-CM | POA: Diagnosis not present

## 2017-11-21 DIAGNOSIS — N186 End stage renal disease: Secondary | ICD-10-CM | POA: Diagnosis not present

## 2017-11-21 DIAGNOSIS — D631 Anemia in chronic kidney disease: Secondary | ICD-10-CM | POA: Diagnosis not present

## 2017-11-21 DIAGNOSIS — Z992 Dependence on renal dialysis: Secondary | ICD-10-CM | POA: Diagnosis not present

## 2017-11-23 DIAGNOSIS — D631 Anemia in chronic kidney disease: Secondary | ICD-10-CM | POA: Diagnosis not present

## 2017-11-23 DIAGNOSIS — E1129 Type 2 diabetes mellitus with other diabetic kidney complication: Secondary | ICD-10-CM | POA: Diagnosis not present

## 2017-11-23 DIAGNOSIS — N2581 Secondary hyperparathyroidism of renal origin: Secondary | ICD-10-CM | POA: Diagnosis not present

## 2017-11-23 DIAGNOSIS — N186 End stage renal disease: Secondary | ICD-10-CM | POA: Diagnosis not present

## 2017-11-23 DIAGNOSIS — Z992 Dependence on renal dialysis: Secondary | ICD-10-CM | POA: Diagnosis not present

## 2017-11-26 DIAGNOSIS — N186 End stage renal disease: Secondary | ICD-10-CM | POA: Diagnosis not present

## 2017-11-26 DIAGNOSIS — E1165 Type 2 diabetes mellitus with hyperglycemia: Principal | ICD-10-CM

## 2017-11-26 DIAGNOSIS — E1122 Type 2 diabetes mellitus with diabetic chronic kidney disease: Secondary | ICD-10-CM | POA: Insufficient documentation

## 2017-11-26 DIAGNOSIS — N2581 Secondary hyperparathyroidism of renal origin: Secondary | ICD-10-CM | POA: Diagnosis not present

## 2017-11-26 DIAGNOSIS — IMO0002 Reserved for concepts with insufficient information to code with codable children: Secondary | ICD-10-CM | POA: Insufficient documentation

## 2017-11-26 DIAGNOSIS — E1129 Type 2 diabetes mellitus with other diabetic kidney complication: Secondary | ICD-10-CM | POA: Diagnosis not present

## 2017-11-26 DIAGNOSIS — D631 Anemia in chronic kidney disease: Secondary | ICD-10-CM | POA: Diagnosis not present

## 2017-11-28 DIAGNOSIS — E1129 Type 2 diabetes mellitus with other diabetic kidney complication: Secondary | ICD-10-CM | POA: Diagnosis not present

## 2017-11-28 DIAGNOSIS — N2581 Secondary hyperparathyroidism of renal origin: Secondary | ICD-10-CM | POA: Diagnosis not present

## 2017-11-28 DIAGNOSIS — N186 End stage renal disease: Secondary | ICD-10-CM | POA: Diagnosis not present

## 2017-11-28 DIAGNOSIS — D631 Anemia in chronic kidney disease: Secondary | ICD-10-CM | POA: Diagnosis not present

## 2017-11-30 DIAGNOSIS — N186 End stage renal disease: Secondary | ICD-10-CM | POA: Diagnosis not present

## 2017-11-30 DIAGNOSIS — N2581 Secondary hyperparathyroidism of renal origin: Secondary | ICD-10-CM | POA: Diagnosis not present

## 2017-11-30 DIAGNOSIS — E1129 Type 2 diabetes mellitus with other diabetic kidney complication: Secondary | ICD-10-CM | POA: Diagnosis not present

## 2017-11-30 DIAGNOSIS — D631 Anemia in chronic kidney disease: Secondary | ICD-10-CM | POA: Diagnosis not present

## 2017-12-03 DIAGNOSIS — E1129 Type 2 diabetes mellitus with other diabetic kidney complication: Secondary | ICD-10-CM | POA: Diagnosis not present

## 2017-12-03 DIAGNOSIS — N186 End stage renal disease: Secondary | ICD-10-CM | POA: Diagnosis not present

## 2017-12-03 DIAGNOSIS — N2581 Secondary hyperparathyroidism of renal origin: Secondary | ICD-10-CM | POA: Diagnosis not present

## 2017-12-03 DIAGNOSIS — D631 Anemia in chronic kidney disease: Secondary | ICD-10-CM | POA: Diagnosis not present

## 2017-12-05 DIAGNOSIS — N186 End stage renal disease: Secondary | ICD-10-CM | POA: Diagnosis not present

## 2017-12-05 DIAGNOSIS — N2581 Secondary hyperparathyroidism of renal origin: Secondary | ICD-10-CM | POA: Diagnosis not present

## 2017-12-05 DIAGNOSIS — E1129 Type 2 diabetes mellitus with other diabetic kidney complication: Secondary | ICD-10-CM | POA: Diagnosis not present

## 2017-12-05 DIAGNOSIS — D631 Anemia in chronic kidney disease: Secondary | ICD-10-CM | POA: Diagnosis not present

## 2017-12-07 ENCOUNTER — Encounter: Payer: Self-pay | Admitting: Adult Health

## 2017-12-07 DIAGNOSIS — N2581 Secondary hyperparathyroidism of renal origin: Secondary | ICD-10-CM | POA: Diagnosis not present

## 2017-12-07 DIAGNOSIS — N186 End stage renal disease: Secondary | ICD-10-CM | POA: Diagnosis not present

## 2017-12-07 DIAGNOSIS — E1129 Type 2 diabetes mellitus with other diabetic kidney complication: Secondary | ICD-10-CM | POA: Diagnosis not present

## 2017-12-07 DIAGNOSIS — D631 Anemia in chronic kidney disease: Secondary | ICD-10-CM | POA: Diagnosis not present

## 2017-12-07 NOTE — Progress Notes (Signed)
Patient ID: [name], female   DOB: 21-Feb-1956, 61 y.o.   MRN: [id number]  [facility] Room Number: 202 A Place of Service:  SNF (31)   CODE STATUS:   No Known Allergies  Chief Complaint  Patient presents with  . Annual Comprehensive Exam    BS 79    HPI:    Past Medical History:  Diagnosis Date  . Anemia    Due to fibroids.  . Bilateral leg edema    a. Chronic.  Marland Kitchen Blind   . Cardiac arrest ([place]) 12/12/2014  . CHF (congestive heart failure) ([place])   . Coronary artery disease   . Deaf    USE SIGN INTERPRETER.  Divorced from husband but lives with him. Has daughter but she does not care for her.  . Diabetes mellitus    a. Per PCP note 2012 (A1C 9.2) - pt unwilling to take meds and was educated on risk of uncontrolled DM.  b. A1C 5.9 in 11/2013.  Marland Kitchen ESRD on hemodialysis ([place])    Mon, Wed, Fri  . Essential hypertension 08/09/2009   Qualifier: Diagnosis of  By: Sarita Haver  MD, Coralyn Mark    . Fibroid uterus   . Heart murmur   . Hyperlipidemia   . Hypertension    a. Has previously refused blood pressure meds.   . Leiomyoma of uterus 08/09/2009   Qualifier: Diagnosis of  By: Sarita Haver  MD, Coralyn Mark    . MI (myocardial infarction) ([place]) 11/2014  . Moderate tricuspid regurgitation   . Osteomyelitis ([place])   . Psychiatric disorder    She frequently exhibits paranoia and has been diagnosed with psychotic d/o NOS during hospital stay in the past. She is tangetial and perseverative during her visits. She apparently has had a bad experience with mental health in [place] in the past and refuses to discuss mental issues for fear that she will be sent back there. Her paranoia, communication issues, financial woes and lack of fam  . Pulmonary hypertension ([place])     Past Surgical History:  Procedure Laterality Date  . AMPUTATION Bilateral 04/02/2015   Procedure: Amputation Bilateral Great Toes MTP Joint;  Surgeon: Newt Minion, MD;  Location: [facility];  Service: Orthopedics;   Laterality: Bilateral;  . AMPUTATION Left 05/14/2015   Procedure: Left Transmetatarsal Amputation;  Surgeon: Newt Minion, MD;  Location: [facility];  Service: Orthopedics;  Laterality: Left;  . AMPUTATION Right 08/20/2015   Procedure: Transmetatarsal Amputation Right Foot;  Surgeon: Newt Minion, MD;  Location: [place];  Service: Orthopedics;  Laterality: Right;  . AV FISTULA PLACEMENT Left 01/02/2014   Procedure: ARTERIOVENOUS (AV) FISTULA CREATION;  Surgeon: Rosetta Posner, MD;  Location: [facility];  Service: Vascular;  Laterality: Left;  . INSERTION OF DIALYSIS CATHETER Right 01/02/2014   Procedure: INSERTION OF DIALYSIS CATHETER;  Surgeon: Rosetta Posner, MD;  Location: [facility];  Service: Vascular;  Laterality: Right;  . NO PAST SURGERIES    . ORIF PATELLA Left 05/17/2015   Procedure: OPEN REDUCTION INTERNAL (ORIF) FIXATION PATELLA;  Surgeon: Newt Minion, MD;  Location: [place];  Service: Orthopedics;  Laterality: Left;    Social History   Socioeconomic History  . Marital status: Widowed    Spouse name: Not on file  . Number of children: Not on file  . Years of education: Not on file  . Highest education level: Not on file  Social Needs  . Financial resource strain: Not on  file  . Food insecurity - worry: Not on file  . Food insecurity - inability: Not on file  . Transportation needs - medical: Not on file  . Transportation needs - non-medical: Not on file  Occupational History  . Not on file  Tobacco Use  . Smoking status: Never Smoker  . Smokeless tobacco: Never Used  . Tobacco comment: Prior dip  Substance and Sexual Activity  . Alcohol use: No  . Drug use: No  . Sexual activity: Not on file  Other Topics Concern  . Not on file  Social History Narrative  . Not on file   Family History  Problem Relation Age of Onset  . Other Father        Drowned from fishing?  . Heart disease Unknown       VITAL SIGNS BP 112/64   Pulse 66   Temp 98 F (36.7 C) (Oral)   Resp 18   Ht 5\' 5"   (1.651 m)   Wt 150 lb 14.4 oz (68.4 kg)   SpO2 98%   BMI 25.11 kg/m   Outpatient Encounter Medications as of 12/07/2017  Medication Sig  . Amino Acids-Protein Hydrolys (FEEDING SUPPLEMENT, PRO-STAT SUGAR FREE 64,) LIQD Take 60 mLs by mouth 2 (two) times daily.  Marland Kitchen amiodarone (PACERONE) 200 MG tablet Take 200 mg by mouth every evening.   . ARIPiprazole (ABILIFY) 10 MG tablet Take 10 mg by mouth at bedtime.   Marland Kitchen atorvastatin (LIPITOR) 10 MG tablet Take 10 mg by mouth every evening.  . calcium acetate (PHOSLO) 667 MG tablet Take 3 tablets by mouth before meals every Sunday, Tuesday, Thursday and Saturday for Dialysis.  Give 3 tablets by mouth two times a day every Mon, Wed, Friday for renal buffer  . clopidogrel (PLAVIX) 75 MG tablet Take 75 mg by mouth every evening.  . insulin aspart (NOVOLOG) 100 UNIT/ML injection Inject as per sliding scale subcutaneously after meals 0 - 70 = 0 units, notify MD is BS is less than 70 71 - 450 = 5 units. Notify MD if BS is greater than 450  . insulin glargine (LANTUS) 100 UNIT/ML injection Inject 10 units subcutaneously at bedtime  . metoprolol succinate (TOPROL-XL) 50 MG 24 hr tablet Take 50 mg by mouth every evening. Take with or immediately following a meal.  . nitroGLYCERIN (NITROSTAT) 0.4 MG SL tablet Place 0.4 mg under the tongue every 5 (five) minutes as needed for chest pain.  . polyethylene glycol (MIRALAX / GLYCOLAX) packet Take 17 g by mouth daily.   . traMADol (ULTRAM) 50 MG tablet Take 1 tablet (50 mg total) by mouth daily.   No facility-administered encounter medications on file as of 12/07/2017.      SIGNIFICANT DIAGNOSTIC EXAMS       ASSESSMENT/ PLAN:    MD is aware of resident's narcotic use and is in agreement with current plan of care. We will attempt to wean resident as apropriate   Ok Edwards NP [facility] Adult Medicine  Contact [phone] Monday through Friday 8am- 5pm  After hours call [phone]  This encounter  was created in error - please disregard.

## 2017-12-10 ENCOUNTER — Encounter: Payer: Self-pay | Admitting: Adult Health

## 2017-12-10 ENCOUNTER — Non-Acute Institutional Stay (SKILLED_NURSING_FACILITY): Payer: Medicare Other | Admitting: Adult Health

## 2017-12-10 DIAGNOSIS — I110 Hypertensive heart disease with heart failure: Secondary | ICD-10-CM

## 2017-12-10 DIAGNOSIS — K5909 Other constipation: Secondary | ICD-10-CM | POA: Diagnosis not present

## 2017-12-10 DIAGNOSIS — N186 End stage renal disease: Secondary | ICD-10-CM

## 2017-12-10 DIAGNOSIS — H9190 Unspecified hearing loss, unspecified ear: Secondary | ICD-10-CM | POA: Diagnosis not present

## 2017-12-10 DIAGNOSIS — D631 Anemia in chronic kidney disease: Secondary | ICD-10-CM | POA: Diagnosis not present

## 2017-12-10 DIAGNOSIS — E1122 Type 2 diabetes mellitus with diabetic chronic kidney disease: Secondary | ICD-10-CM | POA: Diagnosis not present

## 2017-12-10 DIAGNOSIS — I739 Peripheral vascular disease, unspecified: Secondary | ICD-10-CM | POA: Diagnosis not present

## 2017-12-10 DIAGNOSIS — I5042 Chronic combined systolic (congestive) and diastolic (congestive) heart failure: Secondary | ICD-10-CM | POA: Diagnosis not present

## 2017-12-10 DIAGNOSIS — N2581 Secondary hyperparathyroidism of renal origin: Secondary | ICD-10-CM | POA: Diagnosis not present

## 2017-12-10 DIAGNOSIS — E1169 Type 2 diabetes mellitus with other specified complication: Secondary | ICD-10-CM

## 2017-12-10 DIAGNOSIS — I472 Ventricular tachycardia, unspecified: Secondary | ICD-10-CM

## 2017-12-10 DIAGNOSIS — E1129 Type 2 diabetes mellitus with other diabetic kidney complication: Secondary | ICD-10-CM | POA: Diagnosis not present

## 2017-12-10 DIAGNOSIS — E785 Hyperlipidemia, unspecified: Secondary | ICD-10-CM | POA: Diagnosis not present

## 2017-12-10 DIAGNOSIS — E1165 Type 2 diabetes mellitus with hyperglycemia: Secondary | ICD-10-CM

## 2017-12-10 DIAGNOSIS — Z992 Dependence on renal dialysis: Secondary | ICD-10-CM

## 2017-12-10 DIAGNOSIS — IMO0002 Reserved for concepts with insufficient information to code with codable children: Secondary | ICD-10-CM

## 2017-12-10 DIAGNOSIS — H547 Unspecified visual loss: Secondary | ICD-10-CM

## 2017-12-10 NOTE — Progress Notes (Signed)
Provider:  Ok Edwards, NP Location:  [facility] Room Number: 742 A Place of Service:  SNF (31)   PCP: [name], DO Patient Care Team: [name], DO as PCP - General (Internal Medicine) Nyoka Cowden Phylis Bougie, NP as Nurse Practitioner ([place]) [place], [place] ([place])  Extended Emergency Contact Information Primary Emergency Contact: Bark,[name] Address: [place], [place] [address] [place] Phone: [phone] Relation: Spouse Secondary Emergency Contact: [name] of [place] Phone: [phone] Mobile Phone: [phone] Relation: Sister  Code Status: Full Code Goals of Care: Advanced Directive information Advanced Directives 12/10/2017  Does Patient Have a Medical Advance Directive? Yes  Type of Advance Directive Out of facility DNR (pink MOST or yellow form)  Does patient want to make changes to medical advance directive? No - Patient declined  Copy of [place] in Chart? -  Would patient like information on creating a medical advance directive? -  Pre-existing out of facility DNR order (yellow form or pink MOST form) Pink MOST form placed in chart (order not valid for inpatient use)      No Known Allergies   Chief Complaint  Patient presents with  . Annual Exam        HPI: Patient is a 61 y.o. female seen today for an annual comprehensive examination. She has not required hospitalization over the past year. She continues with hemodialysis three times weekly. She is tolerating her abilify without difficulties; she does not tolerate any tapering of her medication; nor time change. She is unable to participate in the hpi or ros. There are no reports of any changes in appetite; changes in behaviors; no significant weight changes. There are no nursing concerns at this time.   Past Medical History:  Diagnosis Date  .  Anemia    Due to fibroids.  . Bilateral leg edema    a. Chronic.  Marland Kitchen Blind   . Cardiac arrest ([place]) 12/12/2014  . CHF (congestive heart failure) ([place])   . Coronary artery disease   . Deaf    USE SIGN INTERPRETER.  Divorced from husband but lives with him. Has daughter but she does not care for her.  . Diabetes mellitus    a. Per PCP note 2012 (A1C 9.2) - pt unwilling to take meds and was educated on risk of uncontrolled DM.  b. A1C 5.9 in 11/2013.  Marland Kitchen ESRD on hemodialysis ([place])    Mon, Wed, Fri  . Essential hypertension 08/09/2009   Qualifier: Diagnosis of  By: Sarita Haver  MD, Coralyn Mark    . Fibroid uterus   . Heart murmur   . Hyperlipidemia   . Hypertension    a. Has previously refused blood pressure meds.   . Leiomyoma of uterus 08/09/2009   Qualifier: Diagnosis of  By: Sarita Haver  MD, Coralyn Mark    . MI (myocardial infarction) ([place]) 11/2014  . Moderate tricuspid regurgitation   . Osteomyelitis ([place])   . Psychiatric disorder    She frequently exhibits paranoia and has been diagnosed with psychotic d/o NOS during hospital stay in the past. She is tangetial and perseverative during her visits. She apparently has had a bad experience with mental health in [place] in the past and refuses to discuss mental issues for fear that she will be sent back there. Her paranoia, communication issues, financial woes and lack of fam  . Pulmonary hypertension ([place])  Past Surgical History:  Procedure Laterality Date  . AMPUTATION Bilateral 04/02/2015   Procedure: Amputation Bilateral Great Toes MTP Joint;  Surgeon: Newt Minion, MD;  Location: [facility];  Service: Orthopedics;  Laterality: Bilateral;  . AMPUTATION Left 05/14/2015   Procedure: Left Transmetatarsal Amputation;  Surgeon: Newt Minion, MD;  Location: [facility];  Service: Orthopedics;  Laterality: Left;  . AMPUTATION Right 08/20/2015   Procedure: Transmetatarsal Amputation Right Foot;  Surgeon: Newt Minion, MD;  Location: [facility];  Service: Orthopedics;   Laterality: Right;  . AV FISTULA PLACEMENT Left 01/02/2014   Procedure: ARTERIOVENOUS (AV) FISTULA CREATION;  Surgeon: Rosetta Posner, MD;  Location: [facility];  Service: Vascular;  Laterality: Left;  . INSERTION OF DIALYSIS CATHETER Right 01/02/2014   Procedure: INSERTION OF DIALYSIS CATHETER;  Surgeon: Rosetta Posner, MD;  Location: [facility];  Service: Vascular;  Laterality: Right;  . NO PAST SURGERIES    . ORIF PATELLA Left 05/17/2015   Procedure: OPEN REDUCTION INTERNAL (ORIF) FIXATION PATELLA;  Surgeon: Newt Minion, MD;  Location: [facility];  Service: Orthopedics;  Laterality: Left;    reports that  has never smoked. she has never used smokeless tobacco. She reports that she does not drink alcohol or use drugs. Social History   Socioeconomic History  . Marital status: Widowed    Spouse name: Not on file  . Number of children: Not on file  . Years of education: Not on file  . Highest education level: Not on file  Social Needs  . Financial resource strain: Not on file  . Food insecurity - worry: Not on file  . Food insecurity - inability: Not on file  . Transportation needs - medical: Not on file  . Transportation needs - non-medical: Not on file  Occupational History  . Not on file  Tobacco Use  . Smoking status: Never Smoker  . Smokeless tobacco: Never Used  . Tobacco comment: Prior dip  Substance and Sexual Activity  . Alcohol use: No  . Drug use: No  . Sexual activity: Not on file  Other Topics Concern  . Not on file  Social History Narrative  . Not on file   Family History  Problem Relation Age of Onset  . Other Father        Drowned from fishing?  . Heart disease Unknown     Vitals:   12/10/17 1225  BP: 122/78  Pulse: 66  Resp: 18  Temp: 98 F (36.7 C)  SpO2: 98%  Weight: 150 lb 14.4 oz (68.4 kg)  Height: 5\' 5"  (1.651 m)   Body mass index is 25.11 kg/m.  Allergies as of 12/10/2017   No Known Allergies     Medication List        Accurate as of 12/10/17 11:59  PM. Always use your most recent med list.          amiodarone 200 MG tablet Commonly known as:  PACERONE Take 200 mg by mouth every evening.   ARIPiprazole 10 MG tablet Commonly known as:  ABILIFY Take 10 mg by mouth at bedtime.   atorvastatin 10 MG tablet Commonly known as:  LIPITOR Take 10 mg by mouth every evening.   calcium acetate 667 MG tablet Commonly known as:  PHOSLO Take 3 tablets by mouth before meals every Sunday, Tuesday, Thursday and Saturday for Dialysis.  Give 3 tablets by mouth two times a day every Mon, Wed, Friday for renal buffer   clopidogrel  75 MG tablet Commonly known as:  PLAVIX Take 75 mg by mouth every evening.   feeding supplement (PRO-STAT SUGAR FREE 64) Liqd Take 60 mLs by mouth 2 (two) times daily.   LANTUS 100 UNIT/ML injection Generic drug:  insulin glargine Inject 10 units subcutaneously at bedtime   metoprolol succinate 50 MG 24 hr tablet Commonly known as:  TOPROL-XL Take 50 mg by mouth every evening. Take with or immediately following a meal.   nitroGLYCERIN 0.4 MG SL tablet Commonly known as:  NITROSTAT Place 0.4 mg under the tongue every 5 (five) minutes as needed for chest pain.   NOVOLOG 100 UNIT/ML injection Generic drug:  insulin aspart Inject as per sliding scale subcutaneously after meals 0 - 70 = 0 units, notify MD is BS is less than 70 71 - 450 = 5 units. Notify MD if BS is greater than 450   polyethylene glycol packet Commonly known as:  MIRALAX / GLYCOLAX Take 17 g by mouth daily.   traMADol 50 MG tablet Commonly known as:  ULTRAM Take 1 tablet (50 mg total) by mouth daily.   UNABLE TO FIND Fluid Restriction - 1200 cc per day - on Dialysis        SIGNIFICANT DIAGNOSTIC EXAMS  PREVIOUS  11-24-14: Myoview: 1. Fixed defects involving the apex, anterior lateral wall and the inferior wall. The apex and anterior lateral wall fixed defects aresuggestive for an infarct. No evidence for reversibility  or ischemia. 2. Diffuse hypokinesia and minimal wall motion along the lateral wall. 3. Left ventricular ejection fraction is 35%. 4. High-risk stress test findings*.  04-16-15: 2-d echo: - Left ventricle: The cavity size was mildly dilated. Wall thickness was increased in a pattern of mild LVH. Systolic function was moderately to severely reduced. The estimated ejection fraction was in the range of 30% to 35%. There is akinesis of the anteroseptal and apical myocardium. Features are consistent with a pseudonormal left ventricular filling pattern, with concomitant abnormal relaxation and increased filling pressure (grade 2 diastolic dysfunction). Doppler parameters are consistent with high ventricular filling pressure. - Mitral valve: Calcified annulus. There was mild regurgitation. - Left atrium: The atrium was mildly dilated. - Pulmonary arteries: Systolic pressure was mildly increased.   NO NEW EXAMS   LABS REVIEWED: she will decline labs PREVIOUS     04-06-17: wbc 5.1; hgb 11.3; hct 34.2 mcv 96.3; plt 208; tsh 0.8; hgb a1c 5.5 chol 197; ldl 130; trig 51; hdl 57  08-01-17: wbc 7.0; hgb 11.0; hct 33.3; mcv 97.1; plt 247; glucose 95; bun 64.2; creat 5.86; k+ 5.5; na++ 132; liver normal albumin 4.5 11-10-17: wbc 6.0; hgb 10.9; hct 34.7; mcv 88.8; plt 275; glucose 421; bun 25.3; creat 1.11; k+ 4.5; na++ 143; ca 8.6; liver normal albumin 3.1; tsh 3.07; hgb a1c 10.8; chol 170; ldl 101; trig 165; hdl 36   NO NEW LABS    Review of Systems  Reason unable to perform ROS: is blind and deaf    Physical Exam  Constitutional: She appears well-developed and well-nourished.  HENT:  Head: Normocephalic.  Right Ear: External ear normal.  Left Ear: External ear normal.  Eyes: Conjunctivae are normal.  Neck: No thyromegaly present.  Cardiovascular: Normal rate, regular rhythm and intact distal pulses.  Murmur heard. 1/6  Pulmonary/Chest: Effort normal and breath sounds normal. No respiratory distress.   Abdominal: Soft. Bowel sounds are normal. She exhibits no distension. There is no tenderness.  Musculoskeletal: She exhibits no edema.  Status post  bilateral transmetatarsal amputations  Is able to move all extremities     Lymphadenopathy:    She has no cervical adenopathy.  Neurological: She is alert.  Skin: Skin is warm. She is not diaphoretic.  Left upper arm a/v fistula: + thrill + bruitt     Psychiatric: She has a normal mood and affect.    ASSESSMENT/ PLAN:  TODAY:   1. Diabetes: without changes hgb a1c 10.8 (previous 5.5)  Will continue lantus 10 units; novolog 5 units after meals   She is on  plavix and is esrd.    2. Dyslipidemia:  Without change; ldl 165; will continue lipitor 10 mg daily    3. ESRD on hemodialysis: is followed by nephrology. Will continue dialysis as scheduled. Will continue phoslo 667 mg 3 tabs three times daily on Sunday; Tuesday; Thursday; Saturday; give 3 tabs twice daily on Monday; Wednesday; Friday.    4. Anemia: hgb 11.3; will monitor   5. Hypertension: 122/78 is presently stable will continue toprol xl 50 mg daily   6. PAD: is without change is status post left and right transmetatarsal amputation: will continue plavix 75 mg daily  Will continue ultram 50 mg daily for pain management   7. 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 Sustained V tach: not a candidate for ICD. Is on amiodarone 200 mg daily for heart rate control will monitor   9. FTT: her current weight is 150 pounds; her albumin is 4.0. Will continue supplements per facility protocol.   10. Psychosis; paranoid : will continue abilify 10 mg daily . Will monitor   She does not tolerate any changes to her abilify when this occurs she will become more psychotic   11. Constipation: will continue miralax daily  Her health maintenance is up to date; will need hgb a1c    She is not a candidate for screening colonoscopy   Time spent with patient: 45 minutes: reviewing chart; time spent with patient and nursing staff. Will continue her current plan of care.   MD is aware of resident's narcotic use and is in agreement with current plan of care. We will wean dosage as appropriate for resident     Ok Edwards NP [facility]  Contact [phone] Monday through Friday 8am- 5pm  After hours call [phone]

## 2017-12-11 LAB — HEMOGLOBIN A1C: Hemoglobin A1C: 5.4

## 2017-12-12 DIAGNOSIS — N2581 Secondary hyperparathyroidism of renal origin: Secondary | ICD-10-CM | POA: Diagnosis not present

## 2017-12-12 DIAGNOSIS — D631 Anemia in chronic kidney disease: Secondary | ICD-10-CM | POA: Diagnosis not present

## 2017-12-12 DIAGNOSIS — N186 End stage renal disease: Secondary | ICD-10-CM | POA: Diagnosis not present

## 2017-12-12 DIAGNOSIS — E1129 Type 2 diabetes mellitus with other diabetic kidney complication: Secondary | ICD-10-CM | POA: Diagnosis not present

## 2017-12-14 DIAGNOSIS — N186 End stage renal disease: Secondary | ICD-10-CM | POA: Diagnosis not present

## 2017-12-14 DIAGNOSIS — E1129 Type 2 diabetes mellitus with other diabetic kidney complication: Secondary | ICD-10-CM | POA: Diagnosis not present

## 2017-12-14 DIAGNOSIS — D631 Anemia in chronic kidney disease: Secondary | ICD-10-CM | POA: Diagnosis not present

## 2017-12-14 DIAGNOSIS — N2581 Secondary hyperparathyroidism of renal origin: Secondary | ICD-10-CM | POA: Diagnosis not present

## 2017-12-16 DIAGNOSIS — E1129 Type 2 diabetes mellitus with other diabetic kidney complication: Secondary | ICD-10-CM | POA: Diagnosis not present

## 2017-12-16 DIAGNOSIS — N2581 Secondary hyperparathyroidism of renal origin: Secondary | ICD-10-CM | POA: Diagnosis not present

## 2017-12-16 DIAGNOSIS — D631 Anemia in chronic kidney disease: Secondary | ICD-10-CM | POA: Diagnosis not present

## 2017-12-16 DIAGNOSIS — N186 End stage renal disease: Secondary | ICD-10-CM | POA: Diagnosis not present

## 2017-12-19 ENCOUNTER — Other Ambulatory Visit: Payer: Self-pay

## 2017-12-19 DIAGNOSIS — N186 End stage renal disease: Secondary | ICD-10-CM | POA: Diagnosis not present

## 2017-12-19 DIAGNOSIS — D631 Anemia in chronic kidney disease: Secondary | ICD-10-CM | POA: Diagnosis not present

## 2017-12-19 DIAGNOSIS — E1129 Type 2 diabetes mellitus with other diabetic kidney complication: Secondary | ICD-10-CM | POA: Diagnosis not present

## 2017-12-19 DIAGNOSIS — N2581 Secondary hyperparathyroidism of renal origin: Secondary | ICD-10-CM | POA: Diagnosis not present

## 2017-12-19 MED ORDER — TRAMADOL HCL 50 MG PO TABS: 50.0000 mg | ORAL_TABLET | Freq: Every day | ORAL | 0 refills | Status: DC

## 2017-12-19 NOTE — Telephone Encounter (Signed)
RX faxed to [facility] @ [phone], phone number [phone] 

## 2017-12-21 DIAGNOSIS — N186 End stage renal disease: Secondary | ICD-10-CM | POA: Diagnosis not present

## 2017-12-21 DIAGNOSIS — E1129 Type 2 diabetes mellitus with other diabetic kidney complication: Secondary | ICD-10-CM | POA: Diagnosis not present

## 2017-12-21 DIAGNOSIS — N2581 Secondary hyperparathyroidism of renal origin: Secondary | ICD-10-CM | POA: Diagnosis not present

## 2017-12-21 DIAGNOSIS — D631 Anemia in chronic kidney disease: Secondary | ICD-10-CM | POA: Diagnosis not present

## 2017-12-23 DIAGNOSIS — D631 Anemia in chronic kidney disease: Secondary | ICD-10-CM | POA: Diagnosis not present

## 2017-12-23 DIAGNOSIS — E1129 Type 2 diabetes mellitus with other diabetic kidney complication: Secondary | ICD-10-CM | POA: Diagnosis not present

## 2017-12-23 DIAGNOSIS — N2581 Secondary hyperparathyroidism of renal origin: Secondary | ICD-10-CM | POA: Diagnosis not present

## 2017-12-23 DIAGNOSIS — N186 End stage renal disease: Secondary | ICD-10-CM | POA: Diagnosis not present

## 2017-12-24 ENCOUNTER — Encounter: Payer: Self-pay | Admitting: Adult Health

## 2017-12-24 ENCOUNTER — Non-Acute Institutional Stay (SKILLED_NURSING_FACILITY): Payer: Medicare Other | Admitting: Adult Health

## 2017-12-24 DIAGNOSIS — F22 Delusional disorders: Secondary | ICD-10-CM | POA: Diagnosis not present

## 2017-12-24 DIAGNOSIS — Z992 Dependence on renal dialysis: Secondary | ICD-10-CM | POA: Diagnosis not present

## 2017-12-24 DIAGNOSIS — N186 End stage renal disease: Secondary | ICD-10-CM | POA: Diagnosis not present

## 2017-12-24 DIAGNOSIS — E1129 Type 2 diabetes mellitus with other diabetic kidney complication: Secondary | ICD-10-CM | POA: Diagnosis not present

## 2017-12-24 NOTE — Progress Notes (Signed)
Location:   [facility] Room Number: 202 A Place of Service:  SNF (31)   CODE STATUS: Full Code  No Known Allergies  Chief Complaint  Patient presents with  . Acute Visit    Increased confusion / aggitation    HPI:  Staff reports that she is having increased agitation present. It is difficult to communicate with her due to her sensory deficits. She is unable to tell me what is upsetting her by writing on my arm. Staff report that there are no changes in her appetite; she has not declined dialysis; there are no reports of fevers present.   Past Medical History:  Diagnosis Date  . Anemia    Due to fibroids.  . Bilateral leg edema    a. Chronic.  Marland Kitchen Blind   . Cardiac arrest ([place]) 12/12/2014  . CHF (congestive heart failure) ([place])   . Coronary artery disease   . Deaf    USE SIGN INTERPRETER.  Divorced from husband but lives with him. Has daughter but she does not care for her.  . Diabetes mellitus    a. Per PCP note 2012 (A1C 9.2) - pt unwilling to take meds and was educated on risk of uncontrolled DM.  b. A1C 5.9 in 11/2013.  Marland Kitchen ESRD on hemodialysis ([place])    Mon, Wed, Fri  . Essential hypertension 08/09/2009   Qualifier: Diagnosis of  By: Sarita Haver  MD, Coralyn Mark    . Fibroid uterus   . Heart murmur   . Hyperlipidemia   . Hypertension    a. Has previously refused blood pressure meds.   . Leiomyoma of uterus 08/09/2009   Qualifier: Diagnosis of  By: Sarita Haver  MD, Coralyn Mark    . MI (myocardial infarction) ([facility]) 11/2014  . Moderate tricuspid regurgitation   . Osteomyelitis ([facility] [place])   . Psychiatric disorder    She frequently exhibits paranoia and has been diagnosed with psychotic d/o NOS during hospital stay in the past. She is tangetial and perseverative during her visits. She apparently has had a bad experience with mental health in [place] in the past and refuses to discuss mental issues for fear that she will be sent back there. Her paranoia, communication issues, financial woes  and lack of fam  . Pulmonary hypertension ([place])     Past Surgical History:  Procedure Laterality Date  . AMPUTATION Bilateral 04/02/2015   Procedure: Amputation Bilateral Great Toes MTP Joint;  Surgeon: Newt Minion, MD;  Location: [facility];  Service: Orthopedics;  Laterality: Bilateral;  . AMPUTATION Left 05/14/2015   Procedure: Left Transmetatarsal Amputation;  Surgeon: Newt Minion, MD;  Location: [facility];  Service: Orthopedics;  Laterality: Left;  . AMPUTATION Right 08/20/2015   Procedure: Transmetatarsal Amputation Right Foot;  Surgeon: Newt Minion, MD;  Location: [facility];  Service: Orthopedics;  Laterality: Right;  . AV FISTULA PLACEMENT Left 01/02/2014   Procedure: ARTERIOVENOUS (AV) FISTULA CREATION;  Surgeon: Rosetta Posner, MD;  Location: [facility];  Service: Vascular;  Laterality: Left;  . INSERTION OF DIALYSIS CATHETER Right 01/02/2014   Procedure: INSERTION OF DIALYSIS CATHETER;  Surgeon: Rosetta Posner, MD;  Location: [facility];  Service: Vascular;  Laterality: Right;  . NO PAST SURGERIES    . ORIF PATELLA Left 05/17/2015   Procedure: OPEN REDUCTION INTERNAL (ORIF) FIXATION PATELLA;  Surgeon: Newt Minion, MD;  Location: [facility];  Service: Orthopedics;  Laterality: Left;    Social History   Socioeconomic History  . Marital status:  Widowed    Spouse name: Not on file  . Number of children: Not on file  . Years of education: Not on file  . Highest education level: Not on file  Social Needs  . Financial resource strain: Not on file  . Food insecurity - worry: Not on file  . Food insecurity - inability: Not on file  . Transportation needs - medical: Not on file  . Transportation needs - non-medical: Not on file  Occupational History  . Not on file  Tobacco Use  . Smoking status: Never Smoker  . Smokeless tobacco: Never Used  . Tobacco comment: Prior dip  Substance and Sexual Activity  . Alcohol use: No  . Drug use: No  . Sexual activity: Not on file  Other Topics Concern  . Not on file   Social History Narrative  . Not on file   Family History  Problem Relation Age of Onset  . Other Father        Drowned from fishing?  . Heart disease Unknown       VITAL SIGNS BP 128/74   Pulse 72   Temp 98.9 F (37.2 C)   Resp 18   Ht 5\' 5"  (1.651 m)   Wt 150 lb 14.4 oz (68.4 kg)   SpO2 98%   BMI 25.11 kg/m   Outpatient Encounter Medications as of 12/24/2017  Medication Sig  . Amino Acids-Protein Hydrolys (FEEDING SUPPLEMENT, PRO-STAT SUGAR FREE 64,) LIQD Take 60 mLs by mouth 2 (two) times daily.  Marland Kitchen amiodarone (PACERONE) 200 MG tablet Take 200 mg by mouth every evening.   . ARIPiprazole (ABILIFY) 10 MG tablet Take 10 mg by mouth at bedtime.   Marland Kitchen atorvastatin (LIPITOR) 10 MG tablet Take 10 mg by mouth every evening.  . calcium acetate (PHOSLO) 667 MG tablet Take 3 tablets by mouth before meals every Sunday, Tuesday, Thursday and Saturday for Dialysis.  Give 3 tablets by mouth two times a day every Mon, Wed, Friday for renal buffer  . clopidogrel (PLAVIX) 75 MG tablet Take 75 mg by mouth every evening.  . insulin aspart (NOVOLOG) 100 UNIT/ML injection Inject as per sliding scale subcutaneously after meals 0 - 70 = 0 units, notify MD is BS is less than 70 71 - 450 = 5 units. Notify MD if BS is greater than 450  . insulin glargine (LANTUS) 100 UNIT/ML injection Inject 10 units subcutaneously at bedtime  . metoprolol succinate (TOPROL-XL) 50 MG 24 hr tablet Take 50 mg by mouth every evening. Take with or immediately following a meal.  . nitroGLYCERIN (NITROSTAT) 0.4 MG SL tablet Place 0.4 mg under the tongue every 5 (five) minutes as needed for chest pain.  . polyethylene glycol (MIRALAX / GLYCOLAX) packet Take 17 g by mouth daily.   . traMADol (ULTRAM) 50 MG tablet Take 1 tablet (50 mg total) by mouth daily.  Marland Kitchen UNABLE TO FIND Fluid Restriction - 1200 cc per day - on Dialysis   No facility-administered encounter medications on file as of 12/24/2017.      SIGNIFICANT  DIAGNOSTIC EXAMS  PREVIOUS  11-24-14: Myoview: 1. Fixed defects involving the apex, anterior lateral wall and the inferior wall. The apex and anterior lateral wall fixed defects aresuggestive for an infarct. No evidence for reversibility or ischemia. 2. Diffuse hypokinesia and minimal wall motion along the lateral wall. 3. Left ventricular ejection fraction is 35%. 4. High-risk stress test findings*.  04-16-15: 2-d echo: - Left ventricle: The cavity size was  mildly dilated. Wall thickness was increased in a pattern of mild LVH. Systolic function was moderately to severely reduced. The estimated ejection fraction was in the range of 30% to 35%. There is akinesis of the anteroseptal and apical myocardium. Features are consistent with a pseudonormal left ventricular filling pattern, with concomitant abnormal relaxation and increased filling pressure (grade 2 diastolic dysfunction). Doppler parameters are consistent with high ventricular filling pressure. - Mitral valve: Calcified annulus. There was mild regurgitation. - Left atrium: The atrium was mildly dilated. - Pulmonary arteries: Systolic pressure was mildly increased.   NO NEW EXAMS   LABS REVIEWED: she will decline labs PREVIOUS     04-06-17: wbc 5.1; hgb 11.3; hct 34.2 mcv 96.3; plt 208; tsh 0.8; hgb a1c 5.5 chol 197; ldl 130; trig 51; hdl 57  08-01-17: wbc 7.0; hgb 11.0; hct 33.3; mcv 97.1; plt 247; glucose 95; bun 64.2; creat 5.86; k+ 5.5; na++ 132; liver normal albumin 4.5 11-10-17: wbc 6.0; hgb 10.9; hct 34.7; mcv 88.8; plt 275; glucose 421; bun 25.3; creat 1.11; k+ 4.5; na++ 143; ca 8.6; liver normal albumin 3.1; tsh 3.07; hgb a1c 10.8; chol 170; ldl 101; trig 165; hdl 36   NO NEW LABS    Review of Systems  Unable to perform ROS: Other (blind and deaf )   Physical Exam  Constitutional: She appears well-developed and well-nourished. No distress.  Neck: No thyromegaly present.  Cardiovascular: Normal rate, regular rhythm and  intact distal pulses.  Murmur heard. 16  Pulmonary/Chest: Effort normal and breath sounds normal. No respiratory distress.  Abdominal: Soft. Bowel sounds are normal. She exhibits no distension. There is no tenderness.  Musculoskeletal: She exhibits no edema.  Status post bilateral transmetatarsal amputations  Is able to move all extremities      Lymphadenopathy:    She has no cervical adenopathy.  Neurological: She is alert.  Skin: Skin is warm and dry. She is not diaphoretic.  Left upper arm a/v fistula: + thrill + bruitt    Psychiatric:  Is easily agitated      ASSESSMENT/ PLAN:  TODAY:   1. Psychosis; paranoid : her status is worse; will increase her abilify to 12 mg daily; she had been on 10 mg daily for a [place] period of time. Will check cbc to look for infection and will continue to monitor her status.     MD is aware of resident's narcotic use and is in agreement with current plan of care. We will attempt to wean resident as apropriate     Ok Edwards NP [facility]  Contact [phone] Monday through Friday 8am- 5pm  After hours call [phone]

## 2017-12-26 DIAGNOSIS — E1129 Type 2 diabetes mellitus with other diabetic kidney complication: Secondary | ICD-10-CM | POA: Diagnosis not present

## 2017-12-26 DIAGNOSIS — N2581 Secondary hyperparathyroidism of renal origin: Secondary | ICD-10-CM | POA: Diagnosis not present

## 2017-12-26 DIAGNOSIS — D631 Anemia in chronic kidney disease: Secondary | ICD-10-CM | POA: Diagnosis not present

## 2017-12-26 DIAGNOSIS — E8770 Fluid overload, unspecified: Secondary | ICD-10-CM | POA: Diagnosis not present

## 2017-12-26 DIAGNOSIS — N186 End stage renal disease: Secondary | ICD-10-CM | POA: Diagnosis not present

## 2017-12-28 ENCOUNTER — Non-Acute Institutional Stay (SKILLED_NURSING_FACILITY): Payer: Medicare Other | Admitting: Adult Health

## 2017-12-28 ENCOUNTER — Encounter: Payer: Self-pay | Admitting: Adult Health

## 2017-12-28 DIAGNOSIS — E1165 Type 2 diabetes mellitus with hyperglycemia: Secondary | ICD-10-CM | POA: Diagnosis not present

## 2017-12-28 DIAGNOSIS — E1129 Type 2 diabetes mellitus with other diabetic kidney complication: Secondary | ICD-10-CM | POA: Diagnosis not present

## 2017-12-28 DIAGNOSIS — E1122 Type 2 diabetes mellitus with diabetic chronic kidney disease: Secondary | ICD-10-CM

## 2017-12-28 DIAGNOSIS — D631 Anemia in chronic kidney disease: Secondary | ICD-10-CM | POA: Diagnosis not present

## 2017-12-28 DIAGNOSIS — N186 End stage renal disease: Secondary | ICD-10-CM | POA: Diagnosis not present

## 2017-12-28 DIAGNOSIS — N2581 Secondary hyperparathyroidism of renal origin: Secondary | ICD-10-CM | POA: Diagnosis not present

## 2017-12-28 DIAGNOSIS — IMO0002 Reserved for concepts with insufficient information to code with codable children: Secondary | ICD-10-CM

## 2017-12-28 DIAGNOSIS — E8770 Fluid overload, unspecified: Secondary | ICD-10-CM | POA: Diagnosis not present

## 2017-12-28 NOTE — Progress Notes (Signed)
Location:   [facility] Room Number: 202 A Place of Service:  SNF (31)   CODE STATUS: Full Code  No Known Allergies  Chief Complaint  Patient presents with  . Acute Visit    Increased aggitation    HPI:  Staff is reporting that she is having increased confusion present. Her morning cbgs are somewhat low at 68. Staff reports that she is experiencing bladder pain; and is having foul odor. There are no reports of fevers present; no reports of any changes in appetite; she is not declining any dialysis treatments. She is unable to fully participate in the hpi or ros. The staff is concerned that she may have an uti.  She is tolerating the increased dose of abilify without difficulty.    Past Medical History:  Diagnosis Date  . Anemia    Due to fibroids.  . Bilateral leg edema    a. Chronic.  Marland Kitchen Blind   . Cardiac arrest ([place]) 12/12/2014  . CHF (congestive heart failure) ([place])   . Coronary artery disease   . Deaf    USE SIGN INTERPRETER.  Divorced from husband but lives with him. Has daughter but she does not care for her.  . Diabetes mellitus    a. Per PCP note 2012 (A1C 9.2) - pt unwilling to take meds and was educated on risk of uncontrolled DM.  b. A1C 5.9 in 11/2013.  Marland Kitchen ESRD on hemodialysis ([place])    Mon, Wed, Fri  . Essential hypertension 08/09/2009   Qualifier: Diagnosis of  By: Sarita Haver  MD, Coralyn Mark    . Fibroid uterus   . Heart murmur   . Hyperlipidemia   . Hypertension    a. Has previously refused blood pressure meds.   . Leiomyoma of uterus 08/09/2009   Qualifier: Diagnosis of  By: Sarita Haver  MD, Coralyn Mark    . MI (myocardial infarction) ([place]) 11/2014  . Moderate tricuspid regurgitation   . Osteomyelitis ([place])   . Psychiatric disorder    She frequently exhibits paranoia and has been diagnosed with psychotic d/o NOS during hospital stay in the past. She is tangetial and perseverative during her visits. She apparently has had a bad experience with mental health in  [place] in the past and refuses to discuss mental issues for fear that she will be sent back there. Her paranoia, communication issues, financial woes and lack of fam  . Pulmonary hypertension ([place])     Past Surgical History:  Procedure Laterality Date  . AMPUTATION Bilateral 04/02/2015   Procedure: Amputation Bilateral Great Toes MTP Joint;  Surgeon: Newt Minion, MD;  Location: [facility];  Service: Orthopedics;  Laterality: Bilateral;  . AMPUTATION Left 05/14/2015   Procedure: Left Transmetatarsal Amputation;  Surgeon: Newt Minion, MD;  Location: [facility];  Service: Orthopedics;  Laterality: Left;  . AMPUTATION Right 08/20/2015   Procedure: Transmetatarsal Amputation Right Foot;  Surgeon: Newt Minion, MD;  Location: [facility];  Service: Orthopedics;  Laterality: Right;  . AV FISTULA PLACEMENT Left 01/02/2014   Procedure: ARTERIOVENOUS (AV) FISTULA CREATION;  Surgeon: Rosetta Posner, MD;  Location: [facility];  Service: Vascular;  Laterality: Left;  . INSERTION OF DIALYSIS CATHETER Right 01/02/2014   Procedure: INSERTION OF DIALYSIS CATHETER;  Surgeon: Rosetta Posner, MD;  Location: [facility];  Service: Vascular;  Laterality: Right;  . NO PAST SURGERIES    . ORIF PATELLA Left 05/17/2015   Procedure: OPEN REDUCTION INTERNAL (ORIF) FIXATION PATELLA;  Surgeon: Meridee Score  V, MD;  Location: [facility];  Service: Orthopedics;  Laterality: Left;    Social History   Socioeconomic History  . Marital status: Widowed    Spouse name: Not on file  . Number of children: Not on file  . Years of education: Not on file  . Highest education level: Not on file  Social Needs  . Financial resource strain: Not on file  . Food insecurity - worry: Not on file  . Food insecurity - inability: Not on file  . Transportation needs - medical: Not on file  . Transportation needs - non-medical: Not on file  Occupational History  . Not on file  Tobacco Use  . Smoking status: Never Smoker  . Smokeless tobacco: Never Used  . Tobacco comment:  Prior dip  Substance and Sexual Activity  . Alcohol use: No  . Drug use: No  . Sexual activity: Not on file  Other Topics Concern  . Not on file  Social History Narrative  . Not on file   Family History  Problem Relation Age of Onset  . Other Father        Drowned from fishing?  . Heart disease Unknown       VITAL SIGNS BP 122/78   Pulse 78   Temp 98 F (36.7 C)   Resp 18   Ht 5\' 5"  (1.651 m)   Wt 150 lb 14.4 oz (68.4 kg)   SpO2 98%   BMI 25.11 kg/m    Outpatient Encounter Medications as of 12/28/2017  Medication Sig  . Amino Acids-Protein Hydrolys (FEEDING SUPPLEMENT, PRO-STAT SUGAR FREE 64,) LIQD Take 60 mLs by mouth 2 (two) times daily.  Marland Kitchen amiodarone (PACERONE) 200 MG tablet Take 200 mg by mouth every evening.   . ARIPiprazole (ABILIFY) 10 MG tablet Take 10 mg by mouth at bedtime. Give with Abilify 2mg  to equal 12 mg  . ARIPiprazole (ABILIFY) 2 MG tablet Take 2 mg by mouth daily. Give with Abilify 10mg  to equal 12 mg  . atorvastatin (LIPITOR) 10 MG tablet Take 10 mg by mouth every evening.  . calcium acetate (PHOSLO) 667 MG tablet Take 3 tablets by mouth before meals every Sunday, Tuesday, Thursday and Saturday for Dialysis.  Give 3 tablets by mouth two times a day every Mon, Wed, Friday for renal buffer  . clopidogrel (PLAVIX) 75 MG tablet Take 75 mg by mouth every evening.  . insulin aspart (NOVOLOG) 100 UNIT/ML injection Inject as per sliding scale subcutaneously after meals 0 - 70 = 0 units, notify MD is BS is less than 70 71 - 450 = 5 units. Notify MD if BS is greater than 450  . insulin glargine (LANTUS) 100 UNIT/ML injection Inject 10 units subcutaneously at bedtime  . metoprolol succinate (TOPROL-XL) 50 MG 24 hr tablet Take 50 mg by mouth every evening. Take with or immediately following a meal.  . nitroGLYCERIN (NITROSTAT) 0.4 MG SL tablet Place 0.4 mg under the tongue every 5 (five) minutes as needed for chest pain.  . polyethylene glycol (MIRALAX / GLYCOLAX)  packet Take 17 g by mouth daily.   . traMADol (ULTRAM) 50 MG tablet Take 1 tablet (50 mg total) by mouth daily.  Marland Kitchen UNABLE TO FIND Fluid Restriction - 1200 cc per day - on Dialysis   No facility-administered encounter medications on file as of 12/28/2017.      SIGNIFICANT DIAGNOSTIC EXAMS   PREVIOUS  11-24-14: Myoview: 1. Fixed defects involving the apex, anterior lateral wall and  the inferior wall. The apex and anterior lateral wall fixed defects aresuggestive for an infarct. No evidence for reversibility or ischemia. 2. Diffuse hypokinesia and minimal wall motion along the lateral wall. 3. Left ventricular ejection fraction is 35%. 4. High-risk stress test findings*.  04-16-15: 2-d echo: - Left ventricle: The cavity size was mildly dilated. Wall thickness was increased in a pattern of mild LVH. Systolic function was moderately to severely reduced. The estimated ejection fraction was in the range of 30% to 35%. There is akinesis of the anteroseptal and apical myocardium. Features are consistent with a pseudonormal left ventricular filling pattern, with concomitant abnormal relaxation and increased filling pressure (grade 2 diastolic dysfunction). Doppler parameters are consistent with high ventricular filling pressure. - Mitral valve: Calcified annulus. There was mild regurgitation. - Left atrium: The atrium was mildly dilated. - Pulmonary arteries: Systolic pressure was mildly increased.   NO NEW EXAMS   LABS REVIEWED: she will decline labs PREVIOUS     04-06-17: wbc 5.1; hgb 11.3; hct 34.2 mcv 96.3; plt 208; tsh 0.8; hgb a1c 5.5 chol 197; ldl 130; trig 51; hdl 57  08-01-17: wbc 7.0; hgb 11.0; hct 33.3; mcv 97.1; plt 247; glucose 95; bun 64.2; creat 5.86; k+ 5.5; na++ 132; liver normal albumin 4.5 11-10-17: wbc 6.0; hgb 10.9; hct 34.7; mcv 88.8; plt 275; glucose 421; bun 25.3; creat 1.11; k+ 4.5; na++ 143; ca 8.6; liver normal albumin 3.1; tsh 3.07; hgb a1c 10.8; chol 170; ldl 101; trig 165;  hdl 36   NO NEW LABS   Review of Systems  Unable to perform ROS: Other (blind and deaf )    Physical Exam  Constitutional: She appears well-developed and well-nourished. No distress.  Neck: No thyromegaly present.  Cardiovascular: Normal rate, regular rhythm and intact distal pulses.  Murmur heard. 1/6   Pulmonary/Chest: Effort normal and breath sounds normal. No respiratory distress.  Abdominal: Soft. Bowel sounds are normal. She exhibits no distension. There is tenderness.  Has supra pubic tenderness   Musculoskeletal: She exhibits no edema.  Status post bilateral transmetatarsal amputations  Is able to move all extremities       Lymphadenopathy:    She has no cervical adenopathy.  Neurological: She is alert.  Skin: Skin is warm and dry. She is not diaphoretic.  Left upper arm a/v fistula: + thrill + bruitt    Psychiatric:  Is somewhat confused     ASSESSMENT/ PLAN:  TODAY  1.  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  Will continue novolog 5 units after meals. Will lower the lantus to 7 units nightly and will monitor   To ensure there are infectious process will get uz/c&s  MD is aware of resident's narcotic use and is in agreement with current plan of care. We will attempt to wean resident as apropriate   Ok Edwards NP [facility]  Contact [phone] Monday through Friday 8am- 5pm  After hours call [phone]

## 2017-12-29 DIAGNOSIS — R3989 Other symptoms and signs involving the genitourinary system: Secondary | ICD-10-CM | POA: Diagnosis not present

## 2017-12-29 DIAGNOSIS — Z79899 Other long term (current) drug therapy: Secondary | ICD-10-CM | POA: Diagnosis not present

## 2017-12-29 DIAGNOSIS — R319 Hematuria, unspecified: Secondary | ICD-10-CM | POA: Diagnosis not present

## 2017-12-29 DIAGNOSIS — N390 Urinary tract infection, site not specified: Secondary | ICD-10-CM | POA: Diagnosis not present

## 2017-12-31 DIAGNOSIS — E1129 Type 2 diabetes mellitus with other diabetic kidney complication: Secondary | ICD-10-CM | POA: Diagnosis not present

## 2017-12-31 DIAGNOSIS — N2581 Secondary hyperparathyroidism of renal origin: Secondary | ICD-10-CM | POA: Diagnosis not present

## 2017-12-31 DIAGNOSIS — D631 Anemia in chronic kidney disease: Secondary | ICD-10-CM | POA: Diagnosis not present

## 2017-12-31 DIAGNOSIS — N186 End stage renal disease: Secondary | ICD-10-CM | POA: Diagnosis not present

## 2017-12-31 DIAGNOSIS — E8770 Fluid overload, unspecified: Secondary | ICD-10-CM | POA: Diagnosis not present

## 2018-01-02 DIAGNOSIS — D631 Anemia in chronic kidney disease: Secondary | ICD-10-CM | POA: Diagnosis not present

## 2018-01-02 DIAGNOSIS — N2581 Secondary hyperparathyroidism of renal origin: Secondary | ICD-10-CM | POA: Diagnosis not present

## 2018-01-02 DIAGNOSIS — N186 End stage renal disease: Secondary | ICD-10-CM | POA: Diagnosis not present

## 2018-01-02 DIAGNOSIS — E1129 Type 2 diabetes mellitus with other diabetic kidney complication: Secondary | ICD-10-CM | POA: Diagnosis not present

## 2018-01-02 DIAGNOSIS — E8770 Fluid overload, unspecified: Secondary | ICD-10-CM | POA: Diagnosis not present

## 2018-01-04 DIAGNOSIS — N186 End stage renal disease: Secondary | ICD-10-CM | POA: Diagnosis not present

## 2018-01-04 DIAGNOSIS — N2581 Secondary hyperparathyroidism of renal origin: Secondary | ICD-10-CM | POA: Diagnosis not present

## 2018-01-04 DIAGNOSIS — D631 Anemia in chronic kidney disease: Secondary | ICD-10-CM | POA: Diagnosis not present

## 2018-01-04 DIAGNOSIS — E8770 Fluid overload, unspecified: Secondary | ICD-10-CM | POA: Diagnosis not present

## 2018-01-04 DIAGNOSIS — E1129 Type 2 diabetes mellitus with other diabetic kidney complication: Secondary | ICD-10-CM | POA: Diagnosis not present

## 2018-01-07 DIAGNOSIS — E8770 Fluid overload, unspecified: Secondary | ICD-10-CM | POA: Diagnosis not present

## 2018-01-07 DIAGNOSIS — D631 Anemia in chronic kidney disease: Secondary | ICD-10-CM | POA: Diagnosis not present

## 2018-01-07 DIAGNOSIS — E1129 Type 2 diabetes mellitus with other diabetic kidney complication: Secondary | ICD-10-CM | POA: Diagnosis not present

## 2018-01-07 DIAGNOSIS — N2581 Secondary hyperparathyroidism of renal origin: Secondary | ICD-10-CM | POA: Diagnosis not present

## 2018-01-07 DIAGNOSIS — N186 End stage renal disease: Secondary | ICD-10-CM | POA: Diagnosis not present

## 2018-01-08 DIAGNOSIS — D631 Anemia in chronic kidney disease: Secondary | ICD-10-CM | POA: Diagnosis not present

## 2018-01-08 DIAGNOSIS — N2581 Secondary hyperparathyroidism of renal origin: Secondary | ICD-10-CM | POA: Diagnosis not present

## 2018-01-08 DIAGNOSIS — N186 End stage renal disease: Secondary | ICD-10-CM | POA: Diagnosis not present

## 2018-01-08 DIAGNOSIS — E8770 Fluid overload, unspecified: Secondary | ICD-10-CM | POA: Diagnosis not present

## 2018-01-08 DIAGNOSIS — E1129 Type 2 diabetes mellitus with other diabetic kidney complication: Secondary | ICD-10-CM | POA: Diagnosis not present

## 2018-01-11 DIAGNOSIS — E8770 Fluid overload, unspecified: Secondary | ICD-10-CM | POA: Diagnosis not present

## 2018-01-11 DIAGNOSIS — E1129 Type 2 diabetes mellitus with other diabetic kidney complication: Secondary | ICD-10-CM | POA: Diagnosis not present

## 2018-01-11 DIAGNOSIS — N186 End stage renal disease: Secondary | ICD-10-CM | POA: Diagnosis not present

## 2018-01-11 DIAGNOSIS — D631 Anemia in chronic kidney disease: Secondary | ICD-10-CM | POA: Diagnosis not present

## 2018-01-11 DIAGNOSIS — N2581 Secondary hyperparathyroidism of renal origin: Secondary | ICD-10-CM | POA: Diagnosis not present

## 2018-01-14 ENCOUNTER — Non-Acute Institutional Stay (SKILLED_NURSING_FACILITY): Payer: Medicare Other | Admitting: Internal Medicine

## 2018-01-14 ENCOUNTER — Encounter: Payer: Self-pay | Admitting: Internal Medicine

## 2018-01-14 DIAGNOSIS — Z992 Dependence on renal dialysis: Secondary | ICD-10-CM | POA: Diagnosis not present

## 2018-01-14 DIAGNOSIS — E1129 Type 2 diabetes mellitus with other diabetic kidney complication: Secondary | ICD-10-CM | POA: Diagnosis not present

## 2018-01-14 DIAGNOSIS — N2581 Secondary hyperparathyroidism of renal origin: Secondary | ICD-10-CM | POA: Diagnosis not present

## 2018-01-14 DIAGNOSIS — F22 Delusional disorders: Secondary | ICD-10-CM | POA: Diagnosis not present

## 2018-01-14 DIAGNOSIS — D631 Anemia in chronic kidney disease: Secondary | ICD-10-CM | POA: Diagnosis not present

## 2018-01-14 DIAGNOSIS — R627 Adult failure to thrive: Secondary | ICD-10-CM | POA: Diagnosis not present

## 2018-01-14 DIAGNOSIS — E8770 Fluid overload, unspecified: Secondary | ICD-10-CM | POA: Diagnosis not present

## 2018-01-14 DIAGNOSIS — E1122 Type 2 diabetes mellitus with diabetic chronic kidney disease: Secondary | ICD-10-CM | POA: Diagnosis not present

## 2018-01-14 DIAGNOSIS — N186 End stage renal disease: Secondary | ICD-10-CM | POA: Diagnosis not present

## 2018-01-14 NOTE — Progress Notes (Signed)
Patient ID: [name], female   DOB: 06-21-1956, 62 y.o.   MRN: [id number]   Location:  [facility] Room Number: 357 A Place of Service:  SNF (31) Provider:  DR Shaela Boer Gwendalyn Ege, DO  Patient Care Team: [name], DO as PCP - General (Internal Medicine) Nyoka Cowden Phylis Bougie, NP as Nurse Practitioner ([place]) [place], Noel ([place])  Extended Emergency Contact Information Primary Emergency Contact: Travieso,[name] Address: [place], [place] [address] [address] [place] Phone: [phone] Relation: Spouse Secondary Emergency Contact: [name] of [place] Phone: [phone] Mobile Phone: [phone] Relation: Sister  Code Status:  Full Code Goals of care: Advanced Directive information Advanced Directives 01/14/2018  Does Patient Have a Medical Advance Directive? Yes  Type of Advance Directive Out of facility DNR (pink MOST or yellow form)  Does patient want to make changes to medical advance directive? No - Patient declined  Copy of [place] in Chart? -  Would patient like information on creating a medical advance directive? -  Pre-existing out of facility DNR order (yellow form or pink MOST form) Pink MOST form placed in chart (order not valid for inpatient use)     Chief Complaint  Patient presents with  . Medical Management of Chronic Issues        HPI:  Pt is a 62 y.o. female seen today for medical management of chronic diseases.  She c/o feeling cold. She has HD this AM. No other concerns. She is a poor historian due to psych d/o. Hx obtained from chart. No nursing issues. No falls. Appetite is excellent. Sleeps well.  DM - controlled. A1c 5.4% (previous 5.5).  CBGs 70-220s. She takes lantus 10 units; novolog 5 units after meals; she has ESRD and gets HD; she is on statin tx  Dyslipidemia - stable on  lipitor 10 mg daily. LDL 165  ESRD - stable on HD MWF via left arm AVF; followed by nephrology. She takes phoslo 667 mg 3 tabs BID on MWF and TID on other days.  Anemia of chronic disease - stable. Hgb 10.9  Hypertension - stable on Toprol xl 50 mg daily   PAD - s/p b/l TMA. Takes plavix 75 mg daily and ultram 50 mg daily for pain management   Combined systolic and diastolic CHF - stable on toprol XL 50mg  daily; ischemic cardiomyopathy  EF is 30-35%  (04-16-15)  Hx Sustained V tach - she [place] not a candidate for ICD; rate controlled on amiodarone 200 mg daily  FTT - weight down 2 lbs since last month. She gets nutritional supplements per facility protocol; albumin 4  Psychosis, paranoid - mood stable on abilify 10 mg daily. She is unable to tolerate changes to her abilify (when this occurs she will become more psychotic)  Chronic constipation - stable on miralax daily  Past Medical History:  Diagnosis Date  . Anemia    Due to fibroids.  . Bilateral leg edema    a. Chronic.  Marland Kitchen Blind   . Cardiac arrest ([place]) 12/12/2014  . CHF (congestive heart failure) ([place])   . Coronary artery disease   . Deaf    USE SIGN INTERPRETER.  Divorced from husband but lives with him. Has daughter but she does not care for her.  . Diabetes mellitus    a. Per PCP note 2012 (A1C 9.2) -  pt unwilling to take meds and was educated on risk of uncontrolled DM.  b. A1C 5.9 in 11/2013.  Marland Kitchen ESRD on hemodialysis ([place])    Mon, Wed, Fri  . Essential hypertension 08/09/2009   Qualifier: Diagnosis of  By: Sarita Haver  MD, Coralyn Mark    . Fibroid uterus   . Heart murmur   . Hyperlipidemia   . Hypertension    a. Has previously refused blood pressure meds.   . Leiomyoma of uterus 08/09/2009   Qualifier: Diagnosis of  By: Sarita Haver  MD, Coralyn Mark    . MI (myocardial infarction) ([place]) 11/2014  . Moderate tricuspid regurgitation   . Osteomyelitis ([place])   . Psychiatric disorder    She frequently exhibits paranoia and has been  diagnosed with psychotic d/o NOS during hospital stay in the past. She is tangetial and perseverative during her visits. She apparently has had a bad experience with mental health in [place] in the past and refuses to discuss mental issues for fear that she will be sent back there. Her paranoia, communication issues, financial woes and lack of fam  . Pulmonary hypertension ([place])    Past Surgical History:  Procedure Laterality Date  . AMPUTATION Bilateral 04/02/2015   Procedure: Amputation Bilateral Great Toes MTP Joint;  Surgeon: Newt Minion, MD;  Location: [facility];  Service: Orthopedics;  Laterality: Bilateral;  . AMPUTATION Left 05/14/2015   Procedure: Left Transmetatarsal Amputation;  Surgeon: Newt Minion, MD;  Location: [facility];  Service: Orthopedics;  Laterality: Left;  . AMPUTATION Right 08/20/2015   Procedure: Transmetatarsal Amputation Right Foot;  Surgeon: Newt Minion, MD;  Location: [facility];  Service: Orthopedics;  Laterality: Right;  . AV FISTULA PLACEMENT Left 01/02/2014   Procedure: ARTERIOVENOUS (AV) FISTULA CREATION;  Surgeon: Rosetta Posner, MD;  Location: [facility];  Service: Vascular;  Laterality: Left;  . INSERTION OF DIALYSIS CATHETER Right 01/02/2014   Procedure: INSERTION OF DIALYSIS CATHETER;  Surgeon: Rosetta Posner, MD;  Location: [facility];  Service: Vascular;  Laterality: Right;  . NO PAST SURGERIES    . ORIF PATELLA Left 05/17/2015   Procedure: OPEN REDUCTION INTERNAL (ORIF) FIXATION PATELLA;  Surgeon: Newt Minion, MD;  Location: [facility];  Service: Orthopedics;  Laterality: Left;    No Known Allergies  Outpatient Encounter Medications as of 01/14/2018  Medication Sig  . Amino Acids-Protein Hydrolys (FEEDING SUPPLEMENT, PRO-STAT SUGAR FREE 64,) LIQD Take 60 mLs by mouth 2 (two) times daily.  Marland Kitchen amiodarone (PACERONE) 200 MG tablet Take 200 mg by mouth every evening.   . ARIPiprazole (ABILIFY) 10 MG tablet Take 10 mg by mouth at bedtime. Give with Abilify 2mg  to equal 12 mg  . ARIPiprazole  (ABILIFY) 2 MG tablet Take 2 mg by mouth daily. Give with Abilify 10mg  to equal 12 mg  . atorvastatin (LIPITOR) 10 MG tablet Take 10 mg by mouth every evening.  . calcium acetate (PHOSLO) 667 MG tablet Take 3 tablets by mouth before meals every Sunday, Tuesday, Thursday and Saturday for Dialysis.  Give 3 tablets by mouth two times a day every Mon, Wed, Friday for renal buffer  . clopidogrel (PLAVIX) 75 MG tablet Take 75 mg by mouth every evening.  . insulin aspart (NOVOLOG) 100 UNIT/ML injection Inject as per sliding scale subcutaneously after meals 0 - 70 = 0 units, notify MD is BS is less than 70 71 - 450 = 5 units. Notify MD if BS is greater than 450  . insulin glargine (LANTUS)  100 UNIT/ML injection Inject 7 units subcutaneously at bedtime  . metoprolol succinate (TOPROL-XL) 50 MG 24 hr tablet Take 50 mg by mouth every evening. Take with or immediately following a meal.  . nitroGLYCERIN (NITROSTAT) 0.4 MG SL tablet Place 0.4 mg under the tongue every 5 (five) minutes as needed for chest pain.  . polyethylene glycol (MIRALAX / GLYCOLAX) packet Take 17 g by mouth daily.   . traMADol (ULTRAM) 50 MG tablet Take 1 tablet (50 mg total) by mouth daily.  Marland Kitchen UNABLE TO FIND Fluid Restriction - 1200 cc per day - on Dialysis   No facility-administered encounter medications on file as of 01/14/2018.     Review of Systems  Unable to perform ROS: Dementia    Immunization History  Administered Date(s) Administered  . Influenza,inj,Quad PF,6+ Mos 01/03/2014  . Influenza-Unspecified 09/24/2014, 11/13/2014, 02/01/2016, 09/20/2016, 10/08/2017  . PPD Test 07/28/2014, 09/15/2016  . Pneumococcal Polysaccharide-23 01/03/2014   Pertinent  Health Maintenance Due  Topic Date Due  . HEMOGLOBIN A1C  06/11/2018   Fall Risk  07/12/2017  Falls in the past year? No   Functional Status Survey:    Vitals:   01/14/18 1038  BP: 114/74  Pulse: 64  Resp: 18  Temp: (!) 97 F (36.1 C)  SpO2: 98%  Weight: 148  lb 4.8 oz (67.3 kg)  Height: 5\' 5"  (1.651 m)   Body mass index is 24.68 kg/m. Physical Exam  Constitutional: She appears well-developed and well-nourished.  Sitting in w/c in NAD  HENT:  Mouth/Throat: Oropharynx is clear and moist. No oropharyngeal exudate.  MMM; no oral thrush  Eyes:  Blind OU  Neck: Neck supple. Carotid bruit is not present. No tracheal deviation present.  Cardiovascular: Normal rate, regular rhythm and intact distal pulses. Exam reveals no gallop and no friction rub.  Murmur (1/6 SEM) heard. Left arm AVF with palpable thrill/audible bruit. Trace LE edema b/l. No calf TTP  Pulmonary/Chest: Effort normal and breath sounds normal. No stridor. No respiratory distress. She has no wheezes. She has no rales. She exhibits no tenderness.  Abdominal: Soft. Normal appearance and bowel sounds are normal. She exhibits no distension and no mass. There is no hepatomegaly. There is no tenderness. There is no rigidity, no rebound and no guarding. No hernia.  obese  Musculoskeletal: She exhibits edema.  Lymphadenopathy:    She has no cervical adenopathy.  Neurological: She is alert.  Skin: Skin is warm and dry. No rash noted.  Psychiatric: She has a normal mood and affect. Her behavior is normal. Thought content normal.    Labs reviewed: Recent Labs    08/01/17 11/10/17  NA 132* 143  K 5.5* 4.5  BUN 64* 25*  CREATININE 5.9* 1.1   Recent Labs    08/01/17 11/10/17  AST 13 14  ALT 13 9  ALKPHOS 96 72   Recent Labs    08/01/17 11/10/17  WBC 7.0 6.0  NEUTROABS 4 4  HGB 11.0* 10.9*  HCT 33* 35*  PLT 247 275   Lab Results  Component Value Date   TSH 3.17 05/18/2016   Lab Results  Component Value Date   HGBA1C 5.4 12/11/2017   Lab Results  Component Value Date   CHOL 170 11/10/2017   HDL 36 11/10/2017   LDLCALC 101 11/10/2017   LDLDIRECT 170 (H) 08/09/2009   TRIG 165 (A) 11/10/2017   CHOLHDL 4.6 Ratio 02/09/2010    Significant Diagnostic Results in last  30 days:  No results  found.  Assessment/Plan   ICD-10-CM   1. Anemia in chronic kidney disease, on chronic dialysis (HCC) N18.6    D63.1    Z99.2   2. FTT (failure to thrive) in adult R62.7   3. Diabetes mellitus with ESRD (end-stage renal disease) ([place]) E11.22    N18.6   4. ESRD (end stage renal disease) ([place]) N18.6   5. Psychosis, paranoid ([place]) [place] current meds as ordered  F/u with HD as scheduled  Fluid restrict 1200cc/day  PT/OT/ST as indicated  F/u with specialists as scheduled  Cont nutritional supplements as ordered  Will follow  Labs/tests ordered: none   Solstice Lastinger S. Perlie Gold  [facility] [address] [place], [place] [address] [phone] Cell (Monday-Friday 8 AM - 5 PM) [phone] After 5 PM and follow prompts

## 2018-01-16 DIAGNOSIS — N2581 Secondary hyperparathyroidism of renal origin: Secondary | ICD-10-CM | POA: Diagnosis not present

## 2018-01-16 DIAGNOSIS — E1129 Type 2 diabetes mellitus with other diabetic kidney complication: Secondary | ICD-10-CM | POA: Diagnosis not present

## 2018-01-16 DIAGNOSIS — E8770 Fluid overload, unspecified: Secondary | ICD-10-CM | POA: Diagnosis not present

## 2018-01-16 DIAGNOSIS — N186 End stage renal disease: Secondary | ICD-10-CM | POA: Diagnosis not present

## 2018-01-16 DIAGNOSIS — D631 Anemia in chronic kidney disease: Secondary | ICD-10-CM | POA: Diagnosis not present

## 2018-01-17 ENCOUNTER — Non-Acute Institutional Stay (SKILLED_NURSING_FACILITY): Payer: Medicare Other | Admitting: Adult Health

## 2018-01-17 ENCOUNTER — Encounter: Payer: Self-pay | Admitting: Adult Health

## 2018-01-17 DIAGNOSIS — Z20828 Contact with and (suspected) exposure to other viral communicable diseases: Secondary | ICD-10-CM

## 2018-01-17 NOTE — Progress Notes (Signed)
Location:   [facility] Room Number: 202 A Place of Service:  SNF (31)   CODE STATUS: Full Code  No Known Allergies  Chief Complaint  Patient presents with  . Acute Visit    Flu like symptoms    HPI:  She has been exposed to influenza A. There is an outbreak on this unit. She is unable to participate in the hpi or ros. There are no reports of cough; sore throat and no reports of fevers present. There are no nursing concerns at this time.   Past Medical History:  Diagnosis Date  . Anemia    Due to fibroids.  . Bilateral leg edema    a. Chronic.  Marland Kitchen Blind   . Cardiac arrest ([place]) 12/12/2014  . CHF (congestive heart failure) ([place])   . Coronary artery disease   . Deaf    USE SIGN INTERPRETER.  Divorced from husband but lives with him. Has daughter but she does not care for her.  . Diabetes mellitus    a. Per PCP note 2012 (A1C 9.2) - pt unwilling to take meds and was educated on risk of uncontrolled DM.  b. A1C 5.9 in 11/2013.  Marland Kitchen ESRD on hemodialysis ([place])    Mon, Wed, Fri  . Essential hypertension 08/09/2009   Qualifier: Diagnosis of  By: Sarita Haver  MD, Coralyn Mark    . Fibroid uterus   . Heart murmur   . Hyperlipidemia   . Hypertension    a. Has previously refused blood pressure meds.   . Leiomyoma of uterus 08/09/2009   Qualifier: Diagnosis of  By: Sarita Haver  MD, Coralyn Mark    . MI (myocardial infarction) ([place]) 11/2014  . Moderate tricuspid regurgitation   . Osteomyelitis ([place])   . Psychiatric disorder    She frequently exhibits paranoia and has been diagnosed with psychotic d/o NOS during hospital stay in the past. She is tangetial and perseverative during her visits. She apparently has had a bad experience with mental health in [place] in the past and refuses to discuss mental issues for fear that she will be sent back there. Her paranoia, communication issues, financial woes and lack of fam  . Pulmonary hypertension ([place])     Past Surgical History:  Procedure  Laterality Date  . AMPUTATION Bilateral 04/02/2015   Procedure: Amputation Bilateral Great Toes MTP Joint;  Surgeon: Newt Minion, MD;  Location: [facility];  Service: Orthopedics;  Laterality: Bilateral;  . AMPUTATION Left 05/14/2015   Procedure: Left Transmetatarsal Amputation;  Surgeon: Newt Minion, MD;  Location: [facility];  Service: Orthopedics;  Laterality: Left;  . AMPUTATION Right 08/20/2015   Procedure: Transmetatarsal Amputation Right Foot;  Surgeon: Newt Minion, MD;  Location: [facility];  Service: Orthopedics;  Laterality: Right;  . AV FISTULA PLACEMENT Left 01/02/2014   Procedure: ARTERIOVENOUS (AV) FISTULA CREATION;  Surgeon: Rosetta Posner, MD;  Location: [facility];  Service: Vascular;  Laterality: Left;  . INSERTION OF DIALYSIS CATHETER Right 01/02/2014   Procedure: INSERTION OF DIALYSIS CATHETER;  Surgeon: Rosetta Posner, MD;  Location: [facility];  Service: Vascular;  Laterality: Right;  . NO PAST SURGERIES    . ORIF PATELLA Left 05/17/2015   Procedure: OPEN REDUCTION INTERNAL (ORIF) FIXATION PATELLA;  Surgeon: Newt Minion, MD;  Location: [facility];  Service: Orthopedics;  Laterality: Left;    Social History   Socioeconomic History  . Marital status: Widowed    Spouse name: Not on file  . Number of  children: Not on file  . Years of education: Not on file  . Highest education level: Not on file  Social Needs  . Financial resource strain: Not on file  . Food insecurity - worry: Not on file  . Food insecurity - inability: Not on file  . Transportation needs - medical: Not on file  . Transportation needs - non-medical: Not on file  Occupational History  . Not on file  Tobacco Use  . Smoking status: Never Smoker  . Smokeless tobacco: Never Used  . Tobacco comment: Prior dip  Substance and Sexual Activity  . Alcohol use: No  . Drug use: No  . Sexual activity: Not on file  Other Topics Concern  . Not on file  Social History Narrative  . Not on file   Family History  Problem Relation Age of  Onset  . Other Father        Drowned from fishing?  . Heart disease Unknown       VITAL SIGNS BP 136/68   Pulse 65   Temp 98 F (36.7 C)   Resp 18   Ht 5\' 5"  (1.651 m)   Wt 153 lb (69.4 kg)   SpO2 98%   BMI 25.46 kg/m   Outpatient Encounter Medications as of 01/17/2018  Medication Sig  . Amino Acids-Protein Hydrolys (FEEDING SUPPLEMENT, PRO-STAT SUGAR FREE 64,) LIQD Take 60 mLs by mouth 2 (two) times daily.  Marland Kitchen amiodarone (PACERONE) 200 MG tablet Take 200 mg by mouth every evening.   . ARIPiprazole (ABILIFY) 10 MG tablet Take 10 mg by mouth at bedtime. Give with Abilify 2mg  to equal 12 mg  . ARIPiprazole (ABILIFY) 2 MG tablet Take 2 mg by mouth daily. Give with Abilify 10mg  to equal 12 mg  . atorvastatin (LIPITOR) 10 MG tablet Take 10 mg by mouth every evening.  . calcium acetate (PHOSLO) 667 MG tablet Take 3 tablets by mouth before meals every Sunday, Tuesday, Thursday and Saturday for Dialysis.  Give 3 tablets by mouth two times a day every Mon, Wed, Friday for renal buffer  . clopidogrel (PLAVIX) 75 MG tablet Take 75 mg by mouth every evening.  . insulin aspart (NOVOLOG) 100 UNIT/ML injection Inject as per sliding scale subcutaneously after meals 0 - 70 = 0 units, notify MD is BS is less than 70 71 - 450 = 5 units. Notify MD if BS is greater than 450  . insulin glargine (LANTUS) 100 UNIT/ML injection Inject 7 units subcutaneously at bedtime  . metoprolol succinate (TOPROL-XL) 50 MG 24 hr tablet Take 50 mg by mouth every evening. Take with or immediately following a meal.  . nitroGLYCERIN (NITROSTAT) 0.4 MG SL tablet Place 0.4 mg under the tongue every 5 (five) minutes as needed for chest pain.  . polyethylene glycol (MIRALAX / GLYCOLAX) packet Take 17 g by mouth daily.   . traMADol (ULTRAM) 50 MG tablet Take 1 tablet (50 mg total) by mouth daily.  Marland Kitchen UNABLE TO FIND Fluid Restriction - 1200 cc per day - on Dialysis   No facility-administered encounter medications on file as of  01/17/2018.      SIGNIFICANT DIAGNOSTIC EXAMS  PREVIOUS  11-24-14: Myoview: 1. Fixed defects involving the apex, anterior lateral wall and the inferior wall. The apex and anterior lateral wall fixed defects aresuggestive for an infarct. No evidence for reversibility or ischemia. 2. Diffuse hypokinesia and minimal wall motion along the lateral wall. 3. Left ventricular ejection fraction is 35%. 4. High-risk stress  test findings*.  04-16-15: 2-d echo: - Left ventricle: The cavity size was mildly dilated. Wall thickness was increased in a pattern of mild LVH. Systolic function was moderately to severely reduced. The estimated ejection fraction was in the range of 30% to 35%. There is akinesis of the anteroseptal and apical myocardium. Features are consistent with a pseudonormal left ventricular filling pattern, with concomitant abnormal relaxation and increased filling pressure (grade 2 diastolic dysfunction). Doppler parameters are consistent with high ventricular filling pressure. - Mitral valve: Calcified annulus. There was mild regurgitation. - Left atrium: The atrium was mildly dilated. - Pulmonary arteries: Systolic pressure was mildly increased.   NO NEW EXAMS   LABS REVIEWED: she will decline labs PREVIOUS     04-06-17: wbc 5.1; hgb 11.3; hct 34.2 mcv 96.3; plt 208; tsh 0.8; hgb a1c 5.5 chol 197; ldl 130; trig 51; hdl 57  08-01-17: wbc 7.0; hgb 11.0; hct 33.3; mcv 97.1; plt 247; glucose 95; bun 64.2; creat 5.86; k+ 5.5; na++ 132; liver normal albumin 4.5 11-10-17: wbc 6.0; hgb 10.9; hct 34.7; mcv 88.8; plt 275; glucose 421; bun 25.3; creat 1.11; k+ 4.5; na++ 143; ca 8.6; liver normal albumin 3.1; tsh 3.07; hgb a1c 10.8; chol 170; ldl 101; trig 165; hdl 36   NO NEW LABS   Review of Systems  Unable to perform ROS: Other (is deaf and blind )    Physical Exam  Constitutional: She appears well-developed and well-nourished. No distress.  Neck: No thyromegaly present.  Cardiovascular:  Normal rate, regular rhythm and intact distal pulses.  Murmur heard. 1/6  Pulmonary/Chest: Effort normal and breath sounds normal. No respiratory distress.  Abdominal: Soft. Bowel sounds are normal. She exhibits no distension. There is no tenderness.  Musculoskeletal: She exhibits no edema.  Status post bilateral transmetatarsal amputations  Is able to move all extremities     Lymphadenopathy:    She has no cervical adenopathy.  Neurological: She is alert.  Skin: Skin is warm and dry. She is not diaphoretic.  Left upper arm a/v fistula: + thrill + bruitt     Psychiatric: She has a normal mood and affect.     ASSESSMENT/ PLAN:  TODAY  1.  Exposure to influenza: will begin tamiflu 30 mg today then three times weekly after dialysis for 5 doses will monitor   MD is aware of resident's narcotic use and is in agreement with current plan of care. We will attempt to wean resident as apropriate    Ok Edwards NP [facility]  Contact [phone] Monday through Friday 8am- 5pm  After hours call [phone]

## 2018-01-18 DIAGNOSIS — N186 End stage renal disease: Secondary | ICD-10-CM | POA: Diagnosis not present

## 2018-01-18 DIAGNOSIS — E8770 Fluid overload, unspecified: Secondary | ICD-10-CM | POA: Diagnosis not present

## 2018-01-18 DIAGNOSIS — E1129 Type 2 diabetes mellitus with other diabetic kidney complication: Secondary | ICD-10-CM | POA: Diagnosis not present

## 2018-01-18 DIAGNOSIS — N2581 Secondary hyperparathyroidism of renal origin: Secondary | ICD-10-CM | POA: Diagnosis not present

## 2018-01-18 DIAGNOSIS — D631 Anemia in chronic kidney disease: Secondary | ICD-10-CM | POA: Diagnosis not present

## 2018-01-21 DIAGNOSIS — D631 Anemia in chronic kidney disease: Secondary | ICD-10-CM | POA: Diagnosis not present

## 2018-01-21 DIAGNOSIS — E8770 Fluid overload, unspecified: Secondary | ICD-10-CM | POA: Diagnosis not present

## 2018-01-21 DIAGNOSIS — N186 End stage renal disease: Secondary | ICD-10-CM | POA: Diagnosis not present

## 2018-01-21 DIAGNOSIS — E1129 Type 2 diabetes mellitus with other diabetic kidney complication: Secondary | ICD-10-CM | POA: Diagnosis not present

## 2018-01-21 DIAGNOSIS — N2581 Secondary hyperparathyroidism of renal origin: Secondary | ICD-10-CM | POA: Diagnosis not present

## 2018-01-23 DIAGNOSIS — N186 End stage renal disease: Secondary | ICD-10-CM | POA: Diagnosis not present

## 2018-01-23 DIAGNOSIS — E8770 Fluid overload, unspecified: Secondary | ICD-10-CM | POA: Diagnosis not present

## 2018-01-23 DIAGNOSIS — E1129 Type 2 diabetes mellitus with other diabetic kidney complication: Secondary | ICD-10-CM | POA: Diagnosis not present

## 2018-01-23 DIAGNOSIS — N2581 Secondary hyperparathyroidism of renal origin: Secondary | ICD-10-CM | POA: Diagnosis not present

## 2018-01-23 DIAGNOSIS — D631 Anemia in chronic kidney disease: Secondary | ICD-10-CM | POA: Diagnosis not present

## 2018-01-24 DIAGNOSIS — E1129 Type 2 diabetes mellitus with other diabetic kidney complication: Secondary | ICD-10-CM | POA: Diagnosis not present

## 2018-01-24 DIAGNOSIS — N186 End stage renal disease: Secondary | ICD-10-CM | POA: Diagnosis not present

## 2018-01-24 DIAGNOSIS — Z992 Dependence on renal dialysis: Secondary | ICD-10-CM | POA: Diagnosis not present

## 2018-01-25 DIAGNOSIS — E1129 Type 2 diabetes mellitus with other diabetic kidney complication: Secondary | ICD-10-CM | POA: Diagnosis not present

## 2018-01-25 DIAGNOSIS — N186 End stage renal disease: Secondary | ICD-10-CM | POA: Diagnosis not present

## 2018-01-25 DIAGNOSIS — N2581 Secondary hyperparathyroidism of renal origin: Secondary | ICD-10-CM | POA: Diagnosis not present

## 2018-01-25 DIAGNOSIS — D631 Anemia in chronic kidney disease: Secondary | ICD-10-CM | POA: Diagnosis not present

## 2018-01-25 DIAGNOSIS — Z992 Dependence on renal dialysis: Secondary | ICD-10-CM | POA: Diagnosis not present

## 2018-01-25 DIAGNOSIS — D509 Iron deficiency anemia, unspecified: Secondary | ICD-10-CM | POA: Diagnosis not present

## 2018-01-28 DIAGNOSIS — D631 Anemia in chronic kidney disease: Secondary | ICD-10-CM | POA: Diagnosis not present

## 2018-01-28 DIAGNOSIS — Z992 Dependence on renal dialysis: Secondary | ICD-10-CM | POA: Diagnosis not present

## 2018-01-28 DIAGNOSIS — D509 Iron deficiency anemia, unspecified: Secondary | ICD-10-CM | POA: Diagnosis not present

## 2018-01-28 DIAGNOSIS — E1129 Type 2 diabetes mellitus with other diabetic kidney complication: Secondary | ICD-10-CM | POA: Diagnosis not present

## 2018-01-28 DIAGNOSIS — N2581 Secondary hyperparathyroidism of renal origin: Secondary | ICD-10-CM | POA: Diagnosis not present

## 2018-01-28 DIAGNOSIS — N186 End stage renal disease: Secondary | ICD-10-CM | POA: Diagnosis not present

## 2018-01-30 DIAGNOSIS — N2581 Secondary hyperparathyroidism of renal origin: Secondary | ICD-10-CM | POA: Diagnosis not present

## 2018-01-30 DIAGNOSIS — D631 Anemia in chronic kidney disease: Secondary | ICD-10-CM | POA: Diagnosis not present

## 2018-01-30 DIAGNOSIS — E1129 Type 2 diabetes mellitus with other diabetic kidney complication: Secondary | ICD-10-CM | POA: Diagnosis not present

## 2018-01-30 DIAGNOSIS — N186 End stage renal disease: Secondary | ICD-10-CM | POA: Diagnosis not present

## 2018-01-30 DIAGNOSIS — Z992 Dependence on renal dialysis: Secondary | ICD-10-CM | POA: Diagnosis not present

## 2018-01-30 DIAGNOSIS — D509 Iron deficiency anemia, unspecified: Secondary | ICD-10-CM | POA: Diagnosis not present

## 2018-02-01 DIAGNOSIS — D509 Iron deficiency anemia, unspecified: Secondary | ICD-10-CM | POA: Diagnosis not present

## 2018-02-01 DIAGNOSIS — D631 Anemia in chronic kidney disease: Secondary | ICD-10-CM | POA: Diagnosis not present

## 2018-02-01 DIAGNOSIS — E1129 Type 2 diabetes mellitus with other diabetic kidney complication: Secondary | ICD-10-CM | POA: Diagnosis not present

## 2018-02-01 DIAGNOSIS — Z992 Dependence on renal dialysis: Secondary | ICD-10-CM | POA: Diagnosis not present

## 2018-02-01 DIAGNOSIS — N2581 Secondary hyperparathyroidism of renal origin: Secondary | ICD-10-CM | POA: Diagnosis not present

## 2018-02-01 DIAGNOSIS — N186 End stage renal disease: Secondary | ICD-10-CM | POA: Diagnosis not present

## 2018-02-04 DIAGNOSIS — D631 Anemia in chronic kidney disease: Secondary | ICD-10-CM | POA: Diagnosis not present

## 2018-02-04 DIAGNOSIS — E1129 Type 2 diabetes mellitus with other diabetic kidney complication: Secondary | ICD-10-CM | POA: Diagnosis not present

## 2018-02-04 DIAGNOSIS — D509 Iron deficiency anemia, unspecified: Secondary | ICD-10-CM | POA: Diagnosis not present

## 2018-02-04 DIAGNOSIS — Z992 Dependence on renal dialysis: Secondary | ICD-10-CM | POA: Diagnosis not present

## 2018-02-04 DIAGNOSIS — N2581 Secondary hyperparathyroidism of renal origin: Secondary | ICD-10-CM | POA: Diagnosis not present

## 2018-02-04 DIAGNOSIS — N186 End stage renal disease: Secondary | ICD-10-CM | POA: Diagnosis not present

## 2018-02-06 DIAGNOSIS — D509 Iron deficiency anemia, unspecified: Secondary | ICD-10-CM | POA: Diagnosis not present

## 2018-02-06 DIAGNOSIS — N2581 Secondary hyperparathyroidism of renal origin: Secondary | ICD-10-CM | POA: Diagnosis not present

## 2018-02-06 DIAGNOSIS — D631 Anemia in chronic kidney disease: Secondary | ICD-10-CM | POA: Diagnosis not present

## 2018-02-06 DIAGNOSIS — Z992 Dependence on renal dialysis: Secondary | ICD-10-CM | POA: Diagnosis not present

## 2018-02-06 DIAGNOSIS — N186 End stage renal disease: Secondary | ICD-10-CM | POA: Diagnosis not present

## 2018-02-06 DIAGNOSIS — E1129 Type 2 diabetes mellitus with other diabetic kidney complication: Secondary | ICD-10-CM | POA: Diagnosis not present

## 2018-02-08 DIAGNOSIS — Z992 Dependence on renal dialysis: Secondary | ICD-10-CM | POA: Diagnosis not present

## 2018-02-08 DIAGNOSIS — D509 Iron deficiency anemia, unspecified: Secondary | ICD-10-CM | POA: Diagnosis not present

## 2018-02-08 DIAGNOSIS — N186 End stage renal disease: Secondary | ICD-10-CM | POA: Diagnosis not present

## 2018-02-08 DIAGNOSIS — N2581 Secondary hyperparathyroidism of renal origin: Secondary | ICD-10-CM | POA: Diagnosis not present

## 2018-02-08 DIAGNOSIS — D631 Anemia in chronic kidney disease: Secondary | ICD-10-CM | POA: Diagnosis not present

## 2018-02-08 DIAGNOSIS — E1129 Type 2 diabetes mellitus with other diabetic kidney complication: Secondary | ICD-10-CM | POA: Diagnosis not present

## 2018-02-11 DIAGNOSIS — D509 Iron deficiency anemia, unspecified: Secondary | ICD-10-CM | POA: Diagnosis not present

## 2018-02-11 DIAGNOSIS — N2581 Secondary hyperparathyroidism of renal origin: Secondary | ICD-10-CM | POA: Diagnosis not present

## 2018-02-11 DIAGNOSIS — E1129 Type 2 diabetes mellitus with other diabetic kidney complication: Secondary | ICD-10-CM | POA: Diagnosis not present

## 2018-02-11 DIAGNOSIS — N186 End stage renal disease: Secondary | ICD-10-CM | POA: Diagnosis not present

## 2018-02-11 DIAGNOSIS — Z992 Dependence on renal dialysis: Secondary | ICD-10-CM | POA: Diagnosis not present

## 2018-02-11 DIAGNOSIS — D631 Anemia in chronic kidney disease: Secondary | ICD-10-CM | POA: Diagnosis not present

## 2018-02-12 ENCOUNTER — Non-Acute Institutional Stay (SKILLED_NURSING_FACILITY): Payer: Medicare Other | Admitting: Adult Health

## 2018-02-12 ENCOUNTER — Encounter: Payer: Self-pay | Admitting: Adult Health

## 2018-02-12 DIAGNOSIS — I472 Ventricular tachycardia, unspecified: Secondary | ICD-10-CM

## 2018-02-12 DIAGNOSIS — I110 Hypertensive heart disease with heart failure: Secondary | ICD-10-CM

## 2018-02-12 DIAGNOSIS — I739 Peripheral vascular disease, unspecified: Secondary | ICD-10-CM

## 2018-02-12 DIAGNOSIS — N186 End stage renal disease: Secondary | ICD-10-CM

## 2018-02-12 DIAGNOSIS — I120 Hypertensive chronic kidney disease with stage 5 chronic kidney disease or end stage renal disease: Secondary | ICD-10-CM

## 2018-02-12 DIAGNOSIS — I5042 Chronic combined systolic (congestive) and diastolic (congestive) heart failure: Secondary | ICD-10-CM

## 2018-02-12 HISTORY — DX: End stage renal disease: N18.6

## 2018-02-12 HISTORY — DX: Hypertensive chronic kidney disease with stage 5 chronic kidney disease or end stage renal disease: I12.0

## 2018-02-12 NOTE — Progress Notes (Signed)
Location:   [facility] Room Number: 932 TFTDD of Service:  SNF (31)   CODE STATUS: full code   No Known Allergies  Chief Complaint  Patient presents with  . Medical Management of Chronic Issues    Hypertension; heart failure; pad and v tach.     HPI:  She is a 62 year old [place] term resident of this facility being seen for the management of her chronic illnesses: hypertension; heart failure; pad and v tach. She is unable to participate in the hpi or ros. There are no reports of chest pain; shortness of breath or cough. There are no nursing concerns at this time.   Past Medical History:  Diagnosis Date  . Anemia    Due to fibroids.  . Bilateral leg edema    a. Chronic.  Marland Kitchen Blind   . Cardiac arrest ([place]) 12/12/2014  . CHF (congestive heart failure) ([place])   . Coronary artery disease   . Deaf    USE SIGN INTERPRETER.  Divorced from husband but lives with him. Has daughter but she does not care for her.  . Diabetes mellitus    a. Per PCP note 2012 (A1C 9.2) - pt unwilling to take meds and was educated on risk of uncontrolled DM.  b. A1C 5.9 in 11/2013.  Marland Kitchen ESRD on hemodialysis ([place])    Mon, Wed, Fri  . Essential hypertension 08/09/2009   Qualifier: Diagnosis of  By: Sarita Haver  MD, Coralyn Mark    . Fibroid uterus   . Heart murmur   . Hyperlipidemia   . Hypertension    a. Has previously refused blood pressure meds.   . Leiomyoma of uterus 08/09/2009   Qualifier: Diagnosis of  By: Sarita Haver  MD, Coralyn Mark    . MI (myocardial infarction) ([place]) 11/2014  . Moderate tricuspid regurgitation   . Osteomyelitis ([place])   . Psychiatric disorder    She frequently exhibits paranoia and has been diagnosed with psychotic d/o NOS during hospital stay in the past. She is tangetial and perseverative during her visits. She apparently has had a bad experience with mental health in [place] in the past and refuses to discuss mental issues for fear that she will be sent back there. Her paranoia,  communication issues, financial woes and lack of fam  . Pulmonary hypertension ([place])     Past Surgical History:  Procedure Laterality Date  . AMPUTATION Bilateral 04/02/2015   Procedure: Amputation Bilateral Great Toes MTP Joint;  Surgeon: Newt Minion, MD;  Location: [facility];  Service: Orthopedics;  Laterality: Bilateral;  . AMPUTATION Left 05/14/2015   Procedure: Left Transmetatarsal Amputation;  Surgeon: Newt Minion, MD;  Location: [facility];  Service: Orthopedics;  Laterality: Left;  . AMPUTATION Right 08/20/2015   Procedure: Transmetatarsal Amputation Right Foot;  Surgeon: Newt Minion, MD;  Location: [facility];  Service: Orthopedics;  Laterality: Right;  . AV FISTULA PLACEMENT Left 01/02/2014   Procedure: ARTERIOVENOUS (AV) FISTULA CREATION;  Surgeon: Rosetta Posner, MD;  Location: [facility];  Service: Vascular;  Laterality: Left;  . INSERTION OF DIALYSIS CATHETER Right 01/02/2014   Procedure: INSERTION OF DIALYSIS CATHETER;  Surgeon: Rosetta Posner, MD;  Location: [facility];  Service: Vascular;  Laterality: Right;  . NO PAST SURGERIES    . ORIF PATELLA Left 05/17/2015   Procedure: OPEN REDUCTION INTERNAL (ORIF) FIXATION PATELLA;  Surgeon: Newt Minion, MD;  Location: [facility];  Service: Orthopedics;  Laterality: Left;  Social History   Socioeconomic History  . Marital status: Widowed    Spouse name: Not on file  . Number of children: Not on file  . Years of education: Not on file  . Highest education level: Not on file  Social Needs  . Financial resource strain: Not on file  . Food insecurity - worry: Not on file  . Food insecurity - inability: Not on file  . Transportation needs - medical: Not on file  . Transportation needs - non-medical: Not on file  Occupational History  . Not on file  Tobacco Use  . Smoking status: Never Smoker  . Smokeless tobacco: Never Used  . Tobacco comment: Prior dip  Substance and Sexual Activity  . Alcohol use: No  . Drug use: No  . Sexual activity: Not on file    Other Topics Concern  . Not on file  Social History Narrative  . Not on file   Family History  Problem Relation Age of Onset  . Other Father        Drowned from fishing?  . Heart disease Unknown       VITAL SIGNS BP 132/74   Pulse 72   Temp 98 F (36.7 C)   Resp 18   Ht 5\' 5"  (1.651 m)   Wt 151 lb (68.5 kg)   SpO2 98%   BMI 25.13 kg/m   Outpatient Encounter Medications as of 02/12/2018  Medication Sig  . Amino Acids-Protein Hydrolys (FEEDING SUPPLEMENT, PRO-STAT SUGAR FREE 64,) LIQD Take 60 mLs by mouth 2 (two) times daily.  Marland Kitchen amiodarone (PACERONE) 200 MG tablet Take 200 mg by mouth every evening.   . ARIPiprazole (ABILIFY) 10 MG tablet Take 10 mg by mouth at bedtime. Give with Abilify 2mg  to equal 12 mg  . ARIPiprazole (ABILIFY) 2 MG tablet Take 2 mg by mouth daily. Give with Abilify 10mg  to equal 12 mg  . atorvastatin (LIPITOR) 10 MG tablet Take 10 mg by mouth every evening.  . calcium acetate (PHOSLO) 667 MG tablet Take 3 tablets by mouth before meals every Sunday, Tuesday, Thursday and Saturday for Dialysis.  Give 3 tablets by mouth two times a day every Mon, Wed, Friday for renal buffer  . clopidogrel (PLAVIX) 75 MG tablet Take 75 mg by mouth every evening.  . insulin aspart (NOVOLOG) 100 UNIT/ML injection Inject as per sliding scale subcutaneously after meals 0 - 70 = 0 units, notify MD is BS is less than 70 71 - 450 = 5 units. Notify MD if BS is greater than 450  . insulin glargine (LANTUS) 100 UNIT/ML injection Inject 7 units subcutaneously at bedtime  . metoprolol succinate (TOPROL-XL) 50 MG 24 hr tablet Take 50 mg by mouth every evening. Take with or immediately following a meal.  . nitroGLYCERIN (NITROSTAT) 0.4 MG SL tablet Place 0.4 mg under the tongue every 5 (five) minutes as needed for chest pain.  . polyethylene glycol (MIRALAX / GLYCOLAX) packet Take 17 g by mouth daily.   . traMADol (ULTRAM) 50 MG tablet Take 1 tablet (50 mg total) by mouth daily.  Marland Kitchen  UNABLE TO FIND Fluid Restriction - 1200 cc per day - on Dialysis   No facility-administered encounter medications on file as of 02/12/2018.      SIGNIFICANT DIAGNOSTIC EXAMS  PREVIOUS  11-24-14: Myoview: 1. Fixed defects involving the apex, anterior lateral wall and the inferior wall. The apex and anterior lateral wall fixed defects aresuggestive for an infarct. No evidence for  reversibility or ischemia. 2. Diffuse hypokinesia and minimal wall motion along the lateral wall. 3. Left ventricular ejection fraction is 35%. 4. High-risk stress test findings*.  04-16-15: 2-d echo: - Left ventricle: The cavity size was mildly dilated. Wall thickness was increased in a pattern of mild LVH. Systolic function was moderately to severely reduced. The estimated ejection fraction was in the range of 30% to 35%. There is akinesis of the anteroseptal and apical myocardium. Features are consistent with a pseudonormal left ventricular filling pattern, with concomitant abnormal relaxation and increased filling pressure (grade 2 diastolic dysfunction). Doppler parameters are consistent with high ventricular filling pressure. - Mitral valve: Calcified annulus. There was mild regurgitation. - Left atrium: The atrium was mildly dilated. - Pulmonary arteries: Systolic pressure was mildly increased.   NO NEW EXAMS   LABS REVIEWED: she will decline labs PREVIOUS     04-06-17: wbc 5.1; hgb 11.3; hct 34.2 mcv 96.3; plt 208; tsh 0.8; hgb a1c 5.5 chol 197; ldl 130; trig 51; hdl 57  08-01-17: wbc 7.0; hgb 11.0; hct 33.3; mcv 97.1; plt 247; glucose 95; bun 64.2; creat 5.86; k+ 5.5; na++ 132; liver normal albumin 4.5 11-10-17: wbc 6.0; hgb 10.9; hct 34.7; mcv 88.8; plt 275; glucose 421; bun 25.3; creat 1.11; k+ 4.5; na++ 143; ca 8.6; liver normal albumin 3.1; tsh 3.07; hgb a1c 10.8; chol 170; ldl 101; trig 165; hdl 36   NO NEW LABS     Review of Systems  Unable to perform ROS: Other (deaf and blind )    Physical Exam   Constitutional: She appears well-developed and well-nourished. No distress.  Neck: No thyromegaly present.  Cardiovascular: Normal rate, regular rhythm and intact distal pulses.  Murmur heard. 1/6  Pulmonary/Chest: Effort normal and breath sounds normal. No respiratory distress.  Abdominal: Soft. Bowel sounds are normal. She exhibits no distension. There is no tenderness.  Musculoskeletal: She exhibits no edema.  Status post bilateral transmetatarsal amputations  Is able to move all extremities   Lymphadenopathy:    She has no cervical adenopathy.  Neurological: She is alert.  Skin: Skin is warm and dry. She is not diaphoretic.  Left upper arm a/v fistula: + thrill + bruitt      Psychiatric: She has a normal mood and affect.    ASSESSMENT/ PLAN:  TODAY:   1. Benign hypertension with ESRD and hypertensive heart disease with chronic combined heart failure : 132/74 is presently stable will continue toprol xl 50 mg daily   2. PAD: is without change is status post left and right transmetatarsal amputation: will continue plavix 75 mg daily  Will continue ultram 50 mg daily for pain management   3. Chronic combined systolic and diastolic heart failure: has ischemic cardiomyopathy  EF is 30-35%  (04-16-15); is presently stable will continue toprol xl 50 mg daily  4. Sustained ventricular trachycardia: not a candidate for ICD. Is on amiodarone 200 mg daily for heart rate control will monitor   PREVIOUS   5. FTT: her current weight is 151 pounds; her albumin is 4.0. Will continue supplements per facility protocol.   6. Psychosis; paranoid : will continue abilify 12 mg daily . She is doing much better since increasing her medications  7. Constipation: will continue miralax daily  8. Diabetes: without changes hgb a1c 10.8 (previous 5.5)  Will continue lantus 10 units; novolog 5 units after meals   She is on plavix and is esrd.    9. Dyslipidemia:  Without change; ldl  165; will continue  lipitor 10 mg daily    10. ESRD on hemodialysis: is followed by nephrology. Will continue dialysis as scheduled. Will continue phoslo 667 mg 3 tabs three times daily on Sunday; Tuesday; Thursday; Saturday; give 3 tabs twice daily on Monday; Wednesday; Friday.    11. Anemia: hgb 11.3; will monitor    MD is aware of resident's narcotic use and is in agreement with current plan of care. We will attempt to wean resident as apropriate   Ok Edwards NP [facility]  Contact [phone] Monday through Friday 8am- 5pm  After hours call [phone]

## 2018-02-13 DIAGNOSIS — N186 End stage renal disease: Secondary | ICD-10-CM | POA: Diagnosis not present

## 2018-02-13 DIAGNOSIS — Z992 Dependence on renal dialysis: Secondary | ICD-10-CM | POA: Diagnosis not present

## 2018-02-13 DIAGNOSIS — N2581 Secondary hyperparathyroidism of renal origin: Secondary | ICD-10-CM | POA: Diagnosis not present

## 2018-02-13 DIAGNOSIS — E1129 Type 2 diabetes mellitus with other diabetic kidney complication: Secondary | ICD-10-CM | POA: Diagnosis not present

## 2018-02-13 DIAGNOSIS — D509 Iron deficiency anemia, unspecified: Secondary | ICD-10-CM | POA: Diagnosis not present

## 2018-02-13 DIAGNOSIS — D631 Anemia in chronic kidney disease: Secondary | ICD-10-CM | POA: Diagnosis not present

## 2018-02-15 DIAGNOSIS — N186 End stage renal disease: Secondary | ICD-10-CM | POA: Diagnosis not present

## 2018-02-15 DIAGNOSIS — N2581 Secondary hyperparathyroidism of renal origin: Secondary | ICD-10-CM | POA: Diagnosis not present

## 2018-02-15 DIAGNOSIS — Z992 Dependence on renal dialysis: Secondary | ICD-10-CM | POA: Diagnosis not present

## 2018-02-15 DIAGNOSIS — D509 Iron deficiency anemia, unspecified: Secondary | ICD-10-CM | POA: Diagnosis not present

## 2018-02-15 DIAGNOSIS — D631 Anemia in chronic kidney disease: Secondary | ICD-10-CM | POA: Diagnosis not present

## 2018-02-15 DIAGNOSIS — E1129 Type 2 diabetes mellitus with other diabetic kidney complication: Secondary | ICD-10-CM | POA: Diagnosis not present

## 2018-02-18 DIAGNOSIS — N186 End stage renal disease: Secondary | ICD-10-CM | POA: Diagnosis not present

## 2018-02-18 DIAGNOSIS — N2581 Secondary hyperparathyroidism of renal origin: Secondary | ICD-10-CM | POA: Diagnosis not present

## 2018-02-18 DIAGNOSIS — Z992 Dependence on renal dialysis: Secondary | ICD-10-CM | POA: Diagnosis not present

## 2018-02-18 DIAGNOSIS — D631 Anemia in chronic kidney disease: Secondary | ICD-10-CM | POA: Diagnosis not present

## 2018-02-18 DIAGNOSIS — E1129 Type 2 diabetes mellitus with other diabetic kidney complication: Secondary | ICD-10-CM | POA: Diagnosis not present

## 2018-02-18 DIAGNOSIS — D509 Iron deficiency anemia, unspecified: Secondary | ICD-10-CM | POA: Diagnosis not present

## 2018-02-21 DIAGNOSIS — E1129 Type 2 diabetes mellitus with other diabetic kidney complication: Secondary | ICD-10-CM | POA: Diagnosis not present

## 2018-02-21 DIAGNOSIS — N186 End stage renal disease: Secondary | ICD-10-CM | POA: Diagnosis not present

## 2018-02-21 DIAGNOSIS — D631 Anemia in chronic kidney disease: Secondary | ICD-10-CM | POA: Diagnosis not present

## 2018-02-21 DIAGNOSIS — Z992 Dependence on renal dialysis: Secondary | ICD-10-CM | POA: Diagnosis not present

## 2018-02-21 DIAGNOSIS — N2581 Secondary hyperparathyroidism of renal origin: Secondary | ICD-10-CM | POA: Diagnosis not present

## 2018-02-21 DIAGNOSIS — D509 Iron deficiency anemia, unspecified: Secondary | ICD-10-CM | POA: Diagnosis not present

## 2018-02-22 DIAGNOSIS — D509 Iron deficiency anemia, unspecified: Secondary | ICD-10-CM | POA: Diagnosis not present

## 2018-02-22 DIAGNOSIS — D631 Anemia in chronic kidney disease: Secondary | ICD-10-CM | POA: Diagnosis not present

## 2018-02-22 DIAGNOSIS — N186 End stage renal disease: Secondary | ICD-10-CM | POA: Diagnosis not present

## 2018-02-22 DIAGNOSIS — E1129 Type 2 diabetes mellitus with other diabetic kidney complication: Secondary | ICD-10-CM | POA: Diagnosis not present

## 2018-02-22 DIAGNOSIS — N2581 Secondary hyperparathyroidism of renal origin: Secondary | ICD-10-CM | POA: Diagnosis not present

## 2018-02-22 DIAGNOSIS — Z992 Dependence on renal dialysis: Secondary | ICD-10-CM | POA: Diagnosis not present

## 2018-02-25 ENCOUNTER — Encounter: Payer: Self-pay | Admitting: Adult Health

## 2018-02-25 ENCOUNTER — Other Ambulatory Visit: Payer: Self-pay

## 2018-02-25 DIAGNOSIS — N186 End stage renal disease: Secondary | ICD-10-CM | POA: Diagnosis not present

## 2018-02-25 DIAGNOSIS — D509 Iron deficiency anemia, unspecified: Secondary | ICD-10-CM | POA: Diagnosis not present

## 2018-02-25 DIAGNOSIS — N2581 Secondary hyperparathyroidism of renal origin: Secondary | ICD-10-CM | POA: Diagnosis not present

## 2018-02-25 DIAGNOSIS — D631 Anemia in chronic kidney disease: Secondary | ICD-10-CM | POA: Diagnosis not present

## 2018-02-25 DIAGNOSIS — E1129 Type 2 diabetes mellitus with other diabetic kidney complication: Secondary | ICD-10-CM | POA: Diagnosis not present

## 2018-02-25 DIAGNOSIS — Z992 Dependence on renal dialysis: Secondary | ICD-10-CM | POA: Diagnosis not present

## 2018-02-25 MED ORDER — TRAMADOL HCL 50 MG PO TABS: 50.0000 mg | ORAL_TABLET | Freq: Every day | ORAL | 0 refills | Status: DC

## 2018-02-25 NOTE — Telephone Encounter (Signed)
RX faxed to [facility] @ [phone], phone number [phone] 

## 2018-02-25 NOTE — Progress Notes (Signed)
Entered in error

## 2018-02-26 ENCOUNTER — Encounter: Payer: Self-pay | Admitting: Adult Health

## 2018-02-26 NOTE — Progress Notes (Signed)
Entered in error

## 2018-02-27 ENCOUNTER — Encounter: Payer: Self-pay | Admitting: Adult Health

## 2018-02-27 ENCOUNTER — Non-Acute Institutional Stay (SKILLED_NURSING_FACILITY): Payer: Medicare Other | Admitting: Adult Health

## 2018-02-27 DIAGNOSIS — E1129 Type 2 diabetes mellitus with other diabetic kidney complication: Secondary | ICD-10-CM | POA: Diagnosis not present

## 2018-02-27 DIAGNOSIS — D509 Iron deficiency anemia, unspecified: Secondary | ICD-10-CM | POA: Diagnosis not present

## 2018-02-27 DIAGNOSIS — F22 Delusional disorders: Secondary | ICD-10-CM | POA: Diagnosis not present

## 2018-02-27 DIAGNOSIS — I255 Ischemic cardiomyopathy: Secondary | ICD-10-CM

## 2018-02-27 DIAGNOSIS — N186 End stage renal disease: Secondary | ICD-10-CM | POA: Diagnosis not present

## 2018-02-27 DIAGNOSIS — N2581 Secondary hyperparathyroidism of renal origin: Secondary | ICD-10-CM | POA: Diagnosis not present

## 2018-02-27 DIAGNOSIS — Z992 Dependence on renal dialysis: Secondary | ICD-10-CM | POA: Diagnosis not present

## 2018-02-27 DIAGNOSIS — I5042 Chronic combined systolic (congestive) and diastolic (congestive) heart failure: Secondary | ICD-10-CM

## 2018-02-27 DIAGNOSIS — D631 Anemia in chronic kidney disease: Secondary | ICD-10-CM | POA: Diagnosis not present

## 2018-02-27 NOTE — Progress Notes (Signed)
Location:   [facility] Room Number: 202 A Place of Service:  SNF (31)   CODE STATUS: Full Code (most form updated 05/28/15)  No Known Allergies  Chief Complaint  Patient presents with  . Acute Visit    Care Plan Meeting    HPI:  We have come together for her routine care plan meeting. Her son is present via conference call. She is unable to participate in the hpi or ros. We did discuss her fluid restriction. I will re-educate staff on the importance of following the restriction. We did discuss her overall status. He will be coming in over the weekend to spend more time with her.she did require her abilify to be increased to 12 mg daily in Dec 2018. We did discuss her advanced directives and have reviewed her MOST form; no changes are to be made at this time. There are no reports of changes in appetite; no reports of uncontrolled pain; no recent reports of behavioral issues. There are no nursing concerns at this time.   Past Medical History:  Diagnosis Date  . Anemia    Due to fibroids.  . Bilateral leg edema    a. Chronic.  Marland Kitchen Blind   . Cardiac arrest ([place]) 12/12/2014  . CHF (congestive heart failure) ([place])   . Coronary artery disease   . Deaf    USE SIGN INTERPRETER.  Divorced from husband but lives with him. Has daughter but she does not care for her.  . Diabetes mellitus    a. Per PCP note 2012 (A1C 9.2) - pt unwilling to take meds and was educated on risk of uncontrolled DM.  b. A1C 5.9 in 11/2013.  Marland Kitchen ESRD on hemodialysis ([place])    Mon, Wed, Fri  . Essential hypertension 08/09/2009   Qualifier: Diagnosis of  By: Sarita Haver  MD, Coralyn Mark    . Fibroid uterus   . Heart murmur   . Hyperlipidemia   . Hypertension    a. Has previously refused blood pressure meds.   . Leiomyoma of uterus 08/09/2009   Qualifier: Diagnosis of  By: Sarita Haver  MD, Coralyn Mark    . MI (myocardial infarction) ([place]) 11/2014  . Moderate tricuspid regurgitation   . Osteomyelitis ([place])   .  Psychiatric disorder    She frequently exhibits paranoia and has been diagnosed with psychotic d/o NOS during hospital stay in the past. She is tangetial and perseverative during her visits. She apparently has had a bad experience with mental health in [place] in the past and refuses to discuss mental issues for fear that she will be sent back there. Her paranoia, communication issues, financial woes and lack of fam  . Pulmonary hypertension ([place])     Past Surgical History:  Procedure Laterality Date  . AMPUTATION Bilateral 04/02/2015   Procedure: Amputation Bilateral Great Toes MTP Joint;  Surgeon: Newt Minion, MD;  Location: [place];  Service: Orthopedics;  Laterality: Bilateral;  . AMPUTATION Left 05/14/2015   Procedure: Left Transmetatarsal Amputation;  Surgeon: Newt Minion, MD;  Location: [place];  Service: Orthopedics;  Laterality: Left;  . AMPUTATION Right 08/20/2015   Procedure: Transmetatarsal Amputation Right Foot;  Surgeon: Newt Minion, MD;  Location: [place];  Service: Orthopedics;  Laterality: Right;  . AV FISTULA PLACEMENT Left 01/02/2014   Procedure: ARTERIOVENOUS (AV) FISTULA CREATION;  Surgeon: Rosetta Posner, MD;  Location: [place];  Service: Vascular;  Laterality: Left;  . INSERTION OF DIALYSIS CATHETER Right 01/02/2014  Procedure: INSERTION OF DIALYSIS CATHETER;  Surgeon: Rosetta Posner, MD;  Location: [place];  Service: Vascular;  Laterality: Right;  . NO PAST SURGERIES    . ORIF PATELLA Left 05/17/2015   Procedure: OPEN REDUCTION INTERNAL (ORIF) FIXATION PATELLA;  Surgeon: Newt Minion, MD;  Location: [place];  Service: Orthopedics;  Laterality: Left;    Social History   Socioeconomic History  . Marital status: Widowed    Spouse name: Not on file  . Number of children: Not on file  . Years of education: Not on file  . Highest education level: Not on file  Social Needs  . Financial resource strain: Not on file  . Food insecurity - worry: Not on file  . Food insecurity - inability:  Not on file  . Transportation needs - medical: Not on file  . Transportation needs - non-medical: Not on file  Occupational History  . Not on file  Tobacco Use  . Smoking status: Never Smoker  . Smokeless tobacco: Never Used  . Tobacco comment: Prior dip  Substance and Sexual Activity  . Alcohol use: No  . Drug use: No  . Sexual activity: Not on file  Other Topics Concern  . Not on file  Social History Narrative  . Not on file   Family History  Problem Relation Age of Onset  . Other Father        Drowned from fishing?  . Heart disease Unknown       VITAL SIGNS BP 126/80   Pulse 74   Temp 98.6 F (37 C)   Resp 18   Ht 5\' 5"  (1.651 m)   Wt 151 lb (68.5 kg)   SpO2 97%   BMI 25.13 kg/m   Outpatient Encounter Medications as of 02/27/2018  Medication Sig  . Amino Acids-Protein Hydrolys (FEEDING SUPPLEMENT, PRO-STAT SUGAR FREE 64,) LIQD Take 60 mLs by mouth 2 (two) times daily.  Marland Kitchen amiodarone (PACERONE) 200 MG tablet Take 200 mg by mouth every evening.   . ARIPiprazole (ABILIFY) 10 MG tablet Take 10 mg by mouth at bedtime. Give with Abilify 2mg  to equal 12 mg  . ARIPiprazole (ABILIFY) 2 MG tablet Take 2 mg by mouth daily. Give with Abilify 10mg  to equal 12 mg  . atorvastatin (LIPITOR) 10 MG tablet Take 10 mg by mouth every evening.  . calcium acetate (PHOSLO) 667 MG tablet Take 3 tablets by mouth before meals every Sunday, Tuesday, Thursday and Saturday for Dialysis.  Give 3 tablets by mouth two times a day every Mon, Wed, Friday for renal buffer  . cinacalcet (SENSIPAR) 30 MG tablet Take 30 mg by mouth daily.  . clopidogrel (PLAVIX) 75 MG tablet Take 75 mg by mouth every evening.  . insulin aspart (NOVOLOG) 100 UNIT/ML injection Inject as per sliding scale subcutaneously after meals 0 - 70 = 0 units, notify MD is BS is less than 70 71 - 450 = 5 units. Notify MD if BS is greater than 450  . insulin glargine (LANTUS) 100 UNIT/ML injection Inject 7 units subcutaneously at  bedtime  . metoprolol succinate (TOPROL-XL) 50 MG 24 hr tablet Take 50 mg by mouth every evening. Take with or immediately following a meal.  . nitroGLYCERIN (NITROSTAT) 0.4 MG SL tablet Place 0.4 mg under the tongue every 5 (five) minutes as needed for chest pain.  . polyethylene glycol (MIRALAX / GLYCOLAX) packet Take 17 g by mouth daily.   . traMADol (ULTRAM) 50 MG tablet Take 1  tablet (50 mg total) by mouth daily.  Marland Kitchen UNABLE TO FIND Fluid Restriction - 1200 cc per day - on Dialysis   No facility-administered encounter medications on file as of 02/27/2018.      SIGNIFICANT DIAGNOSTIC EXAMS  PREVIOUS  11-24-14: Myoview: 1. Fixed defects involving the apex, anterior lateral wall and the inferior wall. The apex and anterior lateral wall fixed defects aresuggestive for an infarct. No evidence for reversibility or ischemia. 2. Diffuse hypokinesia and minimal wall motion along the lateral wall. 3. Left ventricular ejection fraction is 35%. 4. High-risk stress test findings*.  04-16-15: 2-d echo: - Left ventricle: The cavity size was mildly dilated. Wall thickness was increased in a pattern of mild LVH. Systolic function was moderately to severely reduced. The estimated ejection fraction was in the range of 30% to 35%. There is akinesis of the anteroseptal and apical myocardium. Features are consistent with a pseudonormal left ventricular filling pattern, with concomitant abnormal relaxation and increased filling pressure (grade 2 diastolic dysfunction). Doppler parameters are consistent with high ventricular filling pressure. - Mitral valve: Calcified annulus. There was mild regurgitation. - Left atrium: The atrium was mildly dilated. - Pulmonary arteries: Systolic pressure was mildly increased.   NO NEW EXAMS   LABS REVIEWED: she will decline labs PREVIOUS     04-06-17: wbc 5.1; hgb 11.3; hct 34.2 mcv 96.3; plt 208; tsh 0.8; hgb a1c 5.5 chol 197; ldl 130; trig 51; hdl 57  08-01-17: wbc 7.0; hgb  11.0; hct 33.3; mcv 97.1; plt 247; glucose 95; bun 64.2; creat 5.86; k+ 5.5; na++ 132; liver normal albumin 4.5 11-10-17: wbc 6.0; hgb 10.9; hct 34.7; mcv 88.8; plt 275; glucose 421; bun 25.3; creat 1.11; k+ 4.5; na++ 143; ca 8.6; liver normal albumin 3.1; tsh 3.07; hgb a1c 10.8; chol 170; ldl 101; trig 165; hdl 36   NO NEW LABS     Review of Systems  Unable to perform ROS: Other (is blind and deaf )    Physical Exam  Constitutional: She appears well-developed and well-nourished. No distress.  Neck: No thyromegaly present.  Cardiovascular: Normal rate, regular rhythm and intact distal pulses.  Murmur heard. 1/6  Pulmonary/Chest: Effort normal and breath sounds normal. No respiratory distress.  Abdominal: Soft. Bowel sounds are normal. She exhibits no distension. There is no tenderness.  Musculoskeletal: She exhibits no edema.  Status post bilateral transmetatarsal amputations  Is able to move all extremities    Lymphadenopathy:    She has no cervical adenopathy.  Neurological: She is alert.  Skin: Skin is warm and dry. She is not diaphoretic.  Left upper arm a/v fistula: + thrill + bruitt      Psychiatric: She has a normal mood and affect.    ASSESSMENT/ PLAN:  TODAY:   1. Chronic combined systolic and diastolic heart failure: has ischemic cardiomyopathy  EF is 30-35%  (04-16-15); i 2. Psychosis; paranoid :  3. ESRD on hemodialysis: is followed by nephrology.    Will continue her current medication and treatment regimens Will continue her current plan of care.  Will educate staff regarding fluid restriction  Time spent with patient and family 40 minutes: 20 minutes spent with advanced directives and MOST form. He will sign form this weekend when he visits. Discussed overall medical status; expectations of care. Verbalized understanding.   MD is aware of resident's narcotic use and is in agreement with current plan of care. We will attempt to wean resident as apropriate     Neoma Laming  [place] Kuang NP [facility]  Contact [phone] Monday through Friday 8am- 5pm  After hours call [phone]

## 2018-03-01 DIAGNOSIS — D631 Anemia in chronic kidney disease: Secondary | ICD-10-CM | POA: Diagnosis not present

## 2018-03-01 DIAGNOSIS — Z992 Dependence on renal dialysis: Secondary | ICD-10-CM | POA: Diagnosis not present

## 2018-03-01 DIAGNOSIS — E1129 Type 2 diabetes mellitus with other diabetic kidney complication: Secondary | ICD-10-CM | POA: Diagnosis not present

## 2018-03-01 DIAGNOSIS — N2581 Secondary hyperparathyroidism of renal origin: Secondary | ICD-10-CM | POA: Diagnosis not present

## 2018-03-01 DIAGNOSIS — N186 End stage renal disease: Secondary | ICD-10-CM | POA: Diagnosis not present

## 2018-03-01 DIAGNOSIS — D509 Iron deficiency anemia, unspecified: Secondary | ICD-10-CM | POA: Diagnosis not present

## 2018-03-04 DIAGNOSIS — D631 Anemia in chronic kidney disease: Secondary | ICD-10-CM | POA: Diagnosis not present

## 2018-03-04 DIAGNOSIS — N2581 Secondary hyperparathyroidism of renal origin: Secondary | ICD-10-CM | POA: Diagnosis not present

## 2018-03-04 DIAGNOSIS — N186 End stage renal disease: Secondary | ICD-10-CM | POA: Diagnosis not present

## 2018-03-04 DIAGNOSIS — D509 Iron deficiency anemia, unspecified: Secondary | ICD-10-CM | POA: Diagnosis not present

## 2018-03-04 DIAGNOSIS — E1129 Type 2 diabetes mellitus with other diabetic kidney complication: Secondary | ICD-10-CM | POA: Diagnosis not present

## 2018-03-04 DIAGNOSIS — Z992 Dependence on renal dialysis: Secondary | ICD-10-CM | POA: Diagnosis not present

## 2018-03-06 DIAGNOSIS — N2581 Secondary hyperparathyroidism of renal origin: Secondary | ICD-10-CM | POA: Diagnosis not present

## 2018-03-06 DIAGNOSIS — N186 End stage renal disease: Secondary | ICD-10-CM | POA: Diagnosis not present

## 2018-03-06 DIAGNOSIS — D509 Iron deficiency anemia, unspecified: Secondary | ICD-10-CM | POA: Diagnosis not present

## 2018-03-06 DIAGNOSIS — D631 Anemia in chronic kidney disease: Secondary | ICD-10-CM | POA: Diagnosis not present

## 2018-03-06 DIAGNOSIS — E1129 Type 2 diabetes mellitus with other diabetic kidney complication: Secondary | ICD-10-CM | POA: Diagnosis not present

## 2018-03-06 DIAGNOSIS — Z992 Dependence on renal dialysis: Secondary | ICD-10-CM | POA: Diagnosis not present

## 2018-03-08 DIAGNOSIS — D509 Iron deficiency anemia, unspecified: Secondary | ICD-10-CM | POA: Diagnosis not present

## 2018-03-08 DIAGNOSIS — N186 End stage renal disease: Secondary | ICD-10-CM | POA: Diagnosis not present

## 2018-03-08 DIAGNOSIS — N2581 Secondary hyperparathyroidism of renal origin: Secondary | ICD-10-CM | POA: Diagnosis not present

## 2018-03-08 DIAGNOSIS — Z992 Dependence on renal dialysis: Secondary | ICD-10-CM | POA: Diagnosis not present

## 2018-03-08 DIAGNOSIS — D631 Anemia in chronic kidney disease: Secondary | ICD-10-CM | POA: Diagnosis not present

## 2018-03-08 DIAGNOSIS — E1129 Type 2 diabetes mellitus with other diabetic kidney complication: Secondary | ICD-10-CM | POA: Diagnosis not present

## 2018-03-11 DIAGNOSIS — N2581 Secondary hyperparathyroidism of renal origin: Secondary | ICD-10-CM | POA: Diagnosis not present

## 2018-03-11 DIAGNOSIS — D509 Iron deficiency anemia, unspecified: Secondary | ICD-10-CM | POA: Diagnosis not present

## 2018-03-11 DIAGNOSIS — E1129 Type 2 diabetes mellitus with other diabetic kidney complication: Secondary | ICD-10-CM | POA: Diagnosis not present

## 2018-03-11 DIAGNOSIS — D631 Anemia in chronic kidney disease: Secondary | ICD-10-CM | POA: Diagnosis not present

## 2018-03-11 DIAGNOSIS — Z992 Dependence on renal dialysis: Secondary | ICD-10-CM | POA: Diagnosis not present

## 2018-03-11 DIAGNOSIS — N186 End stage renal disease: Secondary | ICD-10-CM | POA: Diagnosis not present

## 2018-03-12 ENCOUNTER — Encounter: Payer: Self-pay | Admitting: Adult Health

## 2018-03-12 ENCOUNTER — Non-Acute Institutional Stay (SKILLED_NURSING_FACILITY): Payer: Medicare Other | Admitting: Adult Health

## 2018-03-12 DIAGNOSIS — F22 Delusional disorders: Secondary | ICD-10-CM

## 2018-03-12 DIAGNOSIS — K5909 Other constipation: Secondary | ICD-10-CM

## 2018-03-12 DIAGNOSIS — E1122 Type 2 diabetes mellitus with diabetic chronic kidney disease: Secondary | ICD-10-CM | POA: Diagnosis not present

## 2018-03-12 DIAGNOSIS — IMO0002 Reserved for concepts with insufficient information to code with codable children: Secondary | ICD-10-CM

## 2018-03-12 DIAGNOSIS — N186 End stage renal disease: Secondary | ICD-10-CM

## 2018-03-12 DIAGNOSIS — R627 Adult failure to thrive: Secondary | ICD-10-CM | POA: Diagnosis not present

## 2018-03-12 DIAGNOSIS — E1165 Type 2 diabetes mellitus with hyperglycemia: Secondary | ICD-10-CM | POA: Diagnosis not present

## 2018-03-12 NOTE — Progress Notes (Signed)
Location:   [facility] Room Number: 202 A Place of Service:  SNF (31)   CODE STATUS: Full Code  No Known Allergies  Chief Complaint  Patient presents with  . Medical Management of Chronic Issues    Diabetes; failure to thrive; psychosis; constipation     HPI:  She is a 62 year old [place] term resident of this facility being seen for the management of her chronic illnesses: constipation; diabetes; failure to thrive psychosis. She is unable to participate in the hpi or ros. There are no reports of changes in appetite; on issues pain management and behavioral issues present. There are no nursing concerns at this time.   Past Medical History:  Diagnosis Date  . Anemia    Due to fibroids.  . Bilateral leg edema    a. Chronic.  Marland Kitchen Blind   . Cardiac arrest ([place]) 12/12/2014  . CHF (congestive heart failure) ([place])   . Coronary artery disease   . Deaf    USE SIGN INTERPRETER.  Divorced from husband but lives with him. Has daughter but she does not care for her.  . Diabetes mellitus    a. Per PCP note 2012 (A1C 9.2) - pt unwilling to take meds and was educated on risk of uncontrolled DM.  b. A1C 5.9 in 11/2013.  Marland Kitchen ESRD on hemodialysis ([place])    Mon, Wed, Fri  . Essential hypertension 08/09/2009   Qualifier: Diagnosis of  By: Sarita Haver  MD, Coralyn Mark    . Fibroid uterus   . Heart murmur   . Hyperlipidemia   . Hypertension    a. Has previously refused blood pressure meds.   . Leiomyoma of uterus 08/09/2009   Qualifier: Diagnosis of  By: Sarita Haver  MD, Coralyn Mark    . MI (myocardial infarction) ([place]) 11/2014  . Moderate tricuspid regurgitation   . Osteomyelitis ([place])   . Psychiatric disorder    She frequently exhibits paranoia and has been diagnosed with psychotic d/o NOS during hospital stay in the past. She is tangetial and perseverative during her visits. She apparently has had a bad experience with mental health in [place] in the past and refuses to discuss mental issues for fear  that she will be sent back there. Her paranoia, communication issues, financial woes and lack of fam  . Pulmonary hypertension ([place])     Past Surgical History:  Procedure Laterality Date  . AMPUTATION Bilateral 04/02/2015   Procedure: Amputation Bilateral Great Toes MTP Joint;  Surgeon: Newt Minion, MD;  Location: [place];  Service: Orthopedics;  Laterality: Bilateral;  . AMPUTATION Left 05/14/2015   Procedure: Left Transmetatarsal Amputation;  Surgeon: Newt Minion, MD;  Location: [place];  Service: Orthopedics;  Laterality: Left;  . AMPUTATION Right 08/20/2015   Procedure: Transmetatarsal Amputation Right Foot;  Surgeon: Newt Minion, MD;  Location: [place];  Service: Orthopedics;  Laterality: Right;  . AV FISTULA PLACEMENT Left 01/02/2014   Procedure: ARTERIOVENOUS (AV) FISTULA CREATION;  Surgeon: Rosetta Posner, MD;  Location: [place];  Service: Vascular;  Laterality: Left;  . INSERTION OF DIALYSIS CATHETER Right 01/02/2014   Procedure: INSERTION OF DIALYSIS CATHETER;  Surgeon: Rosetta Posner, MD;  Location: [place];  Service: Vascular;  Laterality: Right;  . NO PAST SURGERIES    . ORIF PATELLA Left 05/17/2015   Procedure: OPEN REDUCTION INTERNAL (ORIF) FIXATION PATELLA;  Surgeon: Newt Minion, MD;  Location: [place];  Service: Orthopedics;  Laterality: Left;  Social History   Socioeconomic History  . Marital status: Widowed    Spouse name: Not on file  . Number of children: Not on file  . Years of education: Not on file  . Highest education level: Not on file  Social Needs  . Financial resource strain: Not on file  . Food insecurity - worry: Not on file  . Food insecurity - inability: Not on file  . Transportation needs - medical: Not on file  . Transportation needs - non-medical: Not on file  Occupational History  . Not on file  Tobacco Use  . Smoking status: Never Smoker  . Smokeless tobacco: Never Used  . Tobacco comment: Prior dip  Substance and Sexual Activity  . Alcohol use: No  .  Drug use: No  . Sexual activity: Not on file  Other Topics Concern  . Not on file  Social History Narrative  . Not on file   Family History  Problem Relation Age of Onset  . Other Father        Drowned from fishing?  . Heart disease Unknown       VITAL SIGNS BP 126/80   Pulse 74   Temp 98.9 F (37.2 C)   Resp 18   Ht 5\' 5"  (1.651 m)   Wt 149 lb (67.6 kg)   SpO2 98%   BMI 24.79 kg/m   Outpatient Encounter Medications as of 03/12/2018  Medication Sig  . Amino Acids-Protein Hydrolys (FEEDING SUPPLEMENT, PRO-STAT SUGAR FREE 64,) LIQD Take 60 mLs by mouth 2 (two) times daily.  Marland Kitchen amiodarone (PACERONE) 200 MG tablet Take 200 mg by mouth every evening.   . ARIPiprazole (ABILIFY) 10 MG tablet Take 10 mg by mouth at bedtime. Give with Abilify 2mg  to equal 12 mg  . ARIPiprazole (ABILIFY) 2 MG tablet Take 2 mg by mouth daily. Give with Abilify 10mg  to equal 12 mg  . atorvastatin (LIPITOR) 10 MG tablet Take 10 mg by mouth every evening.  . calcium acetate (PHOSLO) 667 MG tablet Take 3 tablets by mouth before meals every Sunday, Tuesday, Thursday and Saturday for Dialysis.  Give 3 tablets by mouth two times a day every Mon, Wed, Friday for renal buffer  . cinacalcet (SENSIPAR) 30 MG tablet Take 30 mg by mouth daily.  . clopidogrel (PLAVIX) 75 MG tablet Take 75 mg by mouth every evening.  . insulin aspart (NOVOLOG) 100 UNIT/ML injection Inject as per sliding scale subcutaneously after meals 0 - 70 = 0 units, notify MD is BS is less than 70 71 - 450 = 5 units. Notify MD if BS is greater than 450  . Insulin Glargine (BASAGLAR KWIKPEN) 100 UNIT/ML SOPN Inject 7 Units into the skin at bedtime.  . metoprolol succinate (TOPROL-XL) 50 MG 24 hr tablet Take 50 mg by mouth every evening. Take with or immediately following a meal.  . nitroGLYCERIN (NITROSTAT) 0.4 MG SL tablet Place 0.4 mg under the tongue every 5 (five) minutes as needed for chest pain.  . polyethylene glycol (MIRALAX / GLYCOLAX)  packet Take 17 g by mouth daily.   . traMADol (ULTRAM) 50 MG tablet Take 1 tablet (50 mg total) by mouth daily.  Marland Kitchen UNABLE TO FIND Fluid Restriction - 1200 cc per day - on Dialysis  . [DISCONTINUED] insulin glargine (LANTUS) 100 UNIT/ML injection Inject 7 units subcutaneously at bedtime   No facility-administered encounter medications on file as of 03/12/2018.      SIGNIFICANT DIAGNOSTIC EXAMS  PREVIOUS  11-24-14: Myoview: 1. Fixed defects involving the apex, anterior lateral wall and the inferior wall. The apex and anterior lateral wall fixed defects aresuggestive for an infarct. No evidence for reversibility or ischemia. 2. Diffuse hypokinesia and minimal wall motion along the lateral wall. 3. Left ventricular ejection fraction is 35%. 4. High-risk stress test findings*.  04-16-15: 2-d echo: - Left ventricle: The cavity size was mildly dilated. Wall thickness was increased in a pattern of mild LVH. Systolic function was moderately to severely reduced. The estimated ejection fraction was in the range of 30% to 35%. There is akinesis of the anteroseptal and apical myocardium. Features are consistent with a pseudonormal left ventricular filling pattern, with concomitant abnormal relaxation and increased filling pressure (grade 2 diastolic dysfunction). Doppler parameters are consistent with high ventricular filling pressure. - Mitral valve: Calcified annulus. There was mild regurgitation. - Left atrium: The atrium was mildly dilated. - Pulmonary arteries: Systolic pressure was mildly increased.   NO NEW EXAMS   LABS REVIEWED: she will decline labs PREVIOUS     04-06-17: wbc 5.1; hgb 11.3; hct 34.2 mcv 96.3; plt 208; tsh 0.8; hgb a1c 5.5 chol 197; ldl 130; trig 51; hdl 57  08-01-17: wbc 7.0; hgb 11.0; hct 33.3; mcv 97.1; plt 247; glucose 95; bun 64.2; creat 5.86; k+ 5.5; na++ 132; liver normal albumin 4.5 11-10-17: wbc 6.0; hgb 10.9; hct 34.7; mcv 88.8; plt 275; glucose 421; bun 25.3; creat  1.11; k+ 4.5; na++ 143; ca 8.6; liver normal albumin 3.1; tsh 3.07; hgb a1c 10.8; chol 170; ldl 101; trig 165; hdl 36   NO NEW LABS    Review of Systems  Unable to perform ROS: Other (is deaf and blind )    Physical Exam  Constitutional: She appears well-developed and well-nourished. No distress.  Cardiovascular: Normal rate, regular rhythm and intact distal pulses.  Murmur heard. 1/6  Pulmonary/Chest: Effort normal and breath sounds normal. No respiratory distress.  Abdominal: Soft. Bowel sounds are normal. She exhibits no distension. There is no tenderness.  Musculoskeletal: She exhibits no edema.  Status post bilateral transmetatarsal amputations  Is able to move all extremities     Neurological: She is alert.  Skin: Skin is warm and dry. She is not diaphoretic.  Left upper arm a/v fistula: + thrill + bruitt       Psychiatric: She has a normal mood and affect.     ASSESSMENT/ PLAN:  TODAY:   1. FTT in adult: her current weight is 149 pounds; her albumin is 4.0. Will continue supplements per facility protocol.   2. Psychosis; paranoid : will continue abilify 12 mg daily . She is doing much better since increasing her medications  3. Chronic constipation: will continue miralax daily  4. Uncontrolled type 2 diabetes mellitus with end stage renal disease: without changes hgb a1c 10.8 (previous 5.5)  Will continue lantus 10 units; novolog 5 units after meals   She is on plavix and is esrd.    PREVIOUS   5. Dyslipidemia:  Without change; ldl 165; will continue lipitor 10 mg daily    6. ESRD on hemodialysis: is followed by nephrology. Will continue dialysis as scheduled. Will continue phoslo 667 mg 3 tabs three times daily on Sunday; Tuesday; Thursday; Saturday; give 3 tabs twice daily on Monday; Wednesday; Friday.    7. Anemia: hgb 11.3; will monitor   8. Benign hypertension with ESRD and hypertensive heart disease with chronic combined heart failure : 126/80 is presently  stable will  continue toprol xl 50 mg daily   9. PAD: is without change is status post left and right transmetatarsal amputation: will continue plavix 75 mg daily  Will continue ultram 50 mg daily for pain management   10. Chronic combined systolic and diastolic heart failure: has ischemic cardiomyopathy  EF is 30-35%  (04-16-15); is presently stable will continue toprol xl 50 mg daily  11. Sustained ventricular trachycardia: not a candidate for ICD. Is on amiodarone 200 mg daily for heart rate control will monitor   Will check cbc; cmp; hgb a1c    MD is aware of resident's narcotic use and is in agreement with current plan of care. We will attempt to wean resident as apropriate    [place] Edwards NP [facility] Adult [facility]  Contact [phone] Monday through Friday 8am- 5pm  After hours call [phone]

## 2018-03-13 DIAGNOSIS — N2581 Secondary hyperparathyroidism of renal origin: Secondary | ICD-10-CM | POA: Diagnosis not present

## 2018-03-13 DIAGNOSIS — D509 Iron deficiency anemia, unspecified: Secondary | ICD-10-CM | POA: Diagnosis not present

## 2018-03-13 DIAGNOSIS — I10 Essential (primary) hypertension: Secondary | ICD-10-CM | POA: Diagnosis not present

## 2018-03-13 DIAGNOSIS — N186 End stage renal disease: Secondary | ICD-10-CM | POA: Diagnosis not present

## 2018-03-13 DIAGNOSIS — E1129 Type 2 diabetes mellitus with other diabetic kidney complication: Secondary | ICD-10-CM | POA: Diagnosis not present

## 2018-03-13 DIAGNOSIS — Z992 Dependence on renal dialysis: Secondary | ICD-10-CM | POA: Diagnosis not present

## 2018-03-13 DIAGNOSIS — Z79899 Other long term (current) drug therapy: Secondary | ICD-10-CM | POA: Diagnosis not present

## 2018-03-13 DIAGNOSIS — E119 Type 2 diabetes mellitus without complications: Secondary | ICD-10-CM | POA: Diagnosis not present

## 2018-03-13 DIAGNOSIS — D631 Anemia in chronic kidney disease: Secondary | ICD-10-CM | POA: Diagnosis not present

## 2018-03-13 DIAGNOSIS — D649 Anemia, unspecified: Secondary | ICD-10-CM | POA: Diagnosis not present

## 2018-03-15 ENCOUNTER — Encounter: Payer: Self-pay | Admitting: Internal Medicine

## 2018-03-15 DIAGNOSIS — Z992 Dependence on renal dialysis: Secondary | ICD-10-CM | POA: Diagnosis not present

## 2018-03-15 DIAGNOSIS — N2581 Secondary hyperparathyroidism of renal origin: Secondary | ICD-10-CM | POA: Diagnosis not present

## 2018-03-15 DIAGNOSIS — D509 Iron deficiency anemia, unspecified: Secondary | ICD-10-CM | POA: Diagnosis not present

## 2018-03-15 DIAGNOSIS — E1129 Type 2 diabetes mellitus with other diabetic kidney complication: Secondary | ICD-10-CM | POA: Diagnosis not present

## 2018-03-15 DIAGNOSIS — D631 Anemia in chronic kidney disease: Secondary | ICD-10-CM | POA: Diagnosis not present

## 2018-03-15 DIAGNOSIS — N186 End stage renal disease: Secondary | ICD-10-CM | POA: Diagnosis not present

## 2018-03-18 DIAGNOSIS — N186 End stage renal disease: Secondary | ICD-10-CM | POA: Diagnosis not present

## 2018-03-18 DIAGNOSIS — D509 Iron deficiency anemia, unspecified: Secondary | ICD-10-CM | POA: Diagnosis not present

## 2018-03-18 DIAGNOSIS — Z992 Dependence on renal dialysis: Secondary | ICD-10-CM | POA: Diagnosis not present

## 2018-03-18 DIAGNOSIS — E1129 Type 2 diabetes mellitus with other diabetic kidney complication: Secondary | ICD-10-CM | POA: Diagnosis not present

## 2018-03-18 DIAGNOSIS — D631 Anemia in chronic kidney disease: Secondary | ICD-10-CM | POA: Diagnosis not present

## 2018-03-18 DIAGNOSIS — N2581 Secondary hyperparathyroidism of renal origin: Secondary | ICD-10-CM | POA: Diagnosis not present

## 2018-03-20 DIAGNOSIS — D631 Anemia in chronic kidney disease: Secondary | ICD-10-CM | POA: Diagnosis not present

## 2018-03-20 DIAGNOSIS — N186 End stage renal disease: Secondary | ICD-10-CM | POA: Diagnosis not present

## 2018-03-20 DIAGNOSIS — Z992 Dependence on renal dialysis: Secondary | ICD-10-CM | POA: Diagnosis not present

## 2018-03-20 DIAGNOSIS — D509 Iron deficiency anemia, unspecified: Secondary | ICD-10-CM | POA: Diagnosis not present

## 2018-03-20 DIAGNOSIS — N2581 Secondary hyperparathyroidism of renal origin: Secondary | ICD-10-CM | POA: Diagnosis not present

## 2018-03-20 DIAGNOSIS — E1129 Type 2 diabetes mellitus with other diabetic kidney complication: Secondary | ICD-10-CM | POA: Diagnosis not present

## 2018-03-22 DIAGNOSIS — E1129 Type 2 diabetes mellitus with other diabetic kidney complication: Secondary | ICD-10-CM | POA: Diagnosis not present

## 2018-03-22 DIAGNOSIS — D509 Iron deficiency anemia, unspecified: Secondary | ICD-10-CM | POA: Diagnosis not present

## 2018-03-22 DIAGNOSIS — N186 End stage renal disease: Secondary | ICD-10-CM | POA: Diagnosis not present

## 2018-03-22 DIAGNOSIS — Z992 Dependence on renal dialysis: Secondary | ICD-10-CM | POA: Diagnosis not present

## 2018-03-22 DIAGNOSIS — D631 Anemia in chronic kidney disease: Secondary | ICD-10-CM | POA: Diagnosis not present

## 2018-03-22 DIAGNOSIS — N2581 Secondary hyperparathyroidism of renal origin: Secondary | ICD-10-CM | POA: Diagnosis not present

## 2018-03-25 DIAGNOSIS — Z992 Dependence on renal dialysis: Secondary | ICD-10-CM | POA: Diagnosis not present

## 2018-03-25 DIAGNOSIS — I5043 Acute on chronic combined systolic (congestive) and diastolic (congestive) heart failure: Secondary | ICD-10-CM | POA: Diagnosis not present

## 2018-03-25 DIAGNOSIS — E1129 Type 2 diabetes mellitus with other diabetic kidney complication: Secondary | ICD-10-CM | POA: Diagnosis not present

## 2018-03-25 DIAGNOSIS — N186 End stage renal disease: Secondary | ICD-10-CM | POA: Diagnosis not present

## 2018-03-25 DIAGNOSIS — M6281 Muscle weakness (generalized): Secondary | ICD-10-CM | POA: Diagnosis not present

## 2018-03-25 DIAGNOSIS — D631 Anemia in chronic kidney disease: Secondary | ICD-10-CM | POA: Diagnosis not present

## 2018-03-25 DIAGNOSIS — N2581 Secondary hyperparathyroidism of renal origin: Secondary | ICD-10-CM | POA: Diagnosis not present

## 2018-03-26 DIAGNOSIS — M6281 Muscle weakness (generalized): Secondary | ICD-10-CM | POA: Diagnosis not present

## 2018-03-26 DIAGNOSIS — I5043 Acute on chronic combined systolic (congestive) and diastolic (congestive) heart failure: Secondary | ICD-10-CM | POA: Diagnosis not present

## 2018-03-27 DIAGNOSIS — D631 Anemia in chronic kidney disease: Secondary | ICD-10-CM | POA: Diagnosis not present

## 2018-03-27 DIAGNOSIS — N186 End stage renal disease: Secondary | ICD-10-CM | POA: Diagnosis not present

## 2018-03-27 DIAGNOSIS — E1129 Type 2 diabetes mellitus with other diabetic kidney complication: Secondary | ICD-10-CM | POA: Diagnosis not present

## 2018-03-27 DIAGNOSIS — M6281 Muscle weakness (generalized): Secondary | ICD-10-CM | POA: Diagnosis not present

## 2018-03-27 DIAGNOSIS — I5043 Acute on chronic combined systolic (congestive) and diastolic (congestive) heart failure: Secondary | ICD-10-CM | POA: Diagnosis not present

## 2018-03-27 DIAGNOSIS — Z992 Dependence on renal dialysis: Secondary | ICD-10-CM | POA: Diagnosis not present

## 2018-03-27 DIAGNOSIS — N2581 Secondary hyperparathyroidism of renal origin: Secondary | ICD-10-CM | POA: Diagnosis not present

## 2018-03-28 DIAGNOSIS — I5043 Acute on chronic combined systolic (congestive) and diastolic (congestive) heart failure: Secondary | ICD-10-CM | POA: Diagnosis not present

## 2018-03-28 DIAGNOSIS — M6281 Muscle weakness (generalized): Secondary | ICD-10-CM | POA: Diagnosis not present

## 2018-03-29 ENCOUNTER — Other Ambulatory Visit: Payer: Self-pay

## 2018-03-29 DIAGNOSIS — N2581 Secondary hyperparathyroidism of renal origin: Secondary | ICD-10-CM | POA: Diagnosis not present

## 2018-03-29 DIAGNOSIS — Z992 Dependence on renal dialysis: Secondary | ICD-10-CM | POA: Diagnosis not present

## 2018-03-29 DIAGNOSIS — N186 End stage renal disease: Secondary | ICD-10-CM | POA: Diagnosis not present

## 2018-03-29 DIAGNOSIS — I5043 Acute on chronic combined systolic (congestive) and diastolic (congestive) heart failure: Secondary | ICD-10-CM | POA: Diagnosis not present

## 2018-03-29 DIAGNOSIS — D631 Anemia in chronic kidney disease: Secondary | ICD-10-CM | POA: Diagnosis not present

## 2018-03-29 DIAGNOSIS — E1129 Type 2 diabetes mellitus with other diabetic kidney complication: Secondary | ICD-10-CM | POA: Diagnosis not present

## 2018-03-29 DIAGNOSIS — M6281 Muscle weakness (generalized): Secondary | ICD-10-CM | POA: Diagnosis not present

## 2018-03-29 MED ORDER — TRAMADOL HCL 50 MG PO TABS: 50.0000 mg | ORAL_TABLET | Freq: Every day | ORAL | 0 refills | Status: DC

## 2018-03-29 NOTE — Telephone Encounter (Signed)
RX faxed to [facility] @ [phone], phone number [phone] 

## 2018-04-01 DIAGNOSIS — I5043 Acute on chronic combined systolic (congestive) and diastolic (congestive) heart failure: Secondary | ICD-10-CM | POA: Diagnosis not present

## 2018-04-01 DIAGNOSIS — N186 End stage renal disease: Secondary | ICD-10-CM | POA: Diagnosis not present

## 2018-04-01 DIAGNOSIS — Z992 Dependence on renal dialysis: Secondary | ICD-10-CM | POA: Diagnosis not present

## 2018-04-01 DIAGNOSIS — D631 Anemia in chronic kidney disease: Secondary | ICD-10-CM | POA: Diagnosis not present

## 2018-04-01 DIAGNOSIS — N2581 Secondary hyperparathyroidism of renal origin: Secondary | ICD-10-CM | POA: Diagnosis not present

## 2018-04-01 DIAGNOSIS — M6281 Muscle weakness (generalized): Secondary | ICD-10-CM | POA: Diagnosis not present

## 2018-04-01 DIAGNOSIS — E1129 Type 2 diabetes mellitus with other diabetic kidney complication: Secondary | ICD-10-CM | POA: Diagnosis not present

## 2018-04-02 DIAGNOSIS — I5043 Acute on chronic combined systolic (congestive) and diastolic (congestive) heart failure: Secondary | ICD-10-CM | POA: Diagnosis not present

## 2018-04-02 DIAGNOSIS — M6281 Muscle weakness (generalized): Secondary | ICD-10-CM | POA: Diagnosis not present

## 2018-04-03 DIAGNOSIS — E1129 Type 2 diabetes mellitus with other diabetic kidney complication: Secondary | ICD-10-CM | POA: Diagnosis not present

## 2018-04-03 DIAGNOSIS — N2581 Secondary hyperparathyroidism of renal origin: Secondary | ICD-10-CM | POA: Diagnosis not present

## 2018-04-03 DIAGNOSIS — M6281 Muscle weakness (generalized): Secondary | ICD-10-CM | POA: Diagnosis not present

## 2018-04-03 DIAGNOSIS — D631 Anemia in chronic kidney disease: Secondary | ICD-10-CM | POA: Diagnosis not present

## 2018-04-03 DIAGNOSIS — I5043 Acute on chronic combined systolic (congestive) and diastolic (congestive) heart failure: Secondary | ICD-10-CM | POA: Diagnosis not present

## 2018-04-03 DIAGNOSIS — N186 End stage renal disease: Secondary | ICD-10-CM | POA: Diagnosis not present

## 2018-04-03 DIAGNOSIS — Z992 Dependence on renal dialysis: Secondary | ICD-10-CM | POA: Diagnosis not present

## 2018-04-04 DIAGNOSIS — M6281 Muscle weakness (generalized): Secondary | ICD-10-CM | POA: Diagnosis not present

## 2018-04-04 DIAGNOSIS — I5043 Acute on chronic combined systolic (congestive) and diastolic (congestive) heart failure: Secondary | ICD-10-CM | POA: Diagnosis not present

## 2018-04-05 DIAGNOSIS — D631 Anemia in chronic kidney disease: Secondary | ICD-10-CM | POA: Diagnosis not present

## 2018-04-05 DIAGNOSIS — N186 End stage renal disease: Secondary | ICD-10-CM | POA: Diagnosis not present

## 2018-04-05 DIAGNOSIS — N2581 Secondary hyperparathyroidism of renal origin: Secondary | ICD-10-CM | POA: Diagnosis not present

## 2018-04-05 DIAGNOSIS — M6281 Muscle weakness (generalized): Secondary | ICD-10-CM | POA: Diagnosis not present

## 2018-04-05 DIAGNOSIS — Z992 Dependence on renal dialysis: Secondary | ICD-10-CM | POA: Diagnosis not present

## 2018-04-05 DIAGNOSIS — I5043 Acute on chronic combined systolic (congestive) and diastolic (congestive) heart failure: Secondary | ICD-10-CM | POA: Diagnosis not present

## 2018-04-05 DIAGNOSIS — E1129 Type 2 diabetes mellitus with other diabetic kidney complication: Secondary | ICD-10-CM | POA: Diagnosis not present

## 2018-04-08 DIAGNOSIS — Z992 Dependence on renal dialysis: Secondary | ICD-10-CM | POA: Diagnosis not present

## 2018-04-08 DIAGNOSIS — N186 End stage renal disease: Secondary | ICD-10-CM | POA: Diagnosis not present

## 2018-04-08 DIAGNOSIS — E1129 Type 2 diabetes mellitus with other diabetic kidney complication: Secondary | ICD-10-CM | POA: Diagnosis not present

## 2018-04-08 DIAGNOSIS — N2581 Secondary hyperparathyroidism of renal origin: Secondary | ICD-10-CM | POA: Diagnosis not present

## 2018-04-08 DIAGNOSIS — D631 Anemia in chronic kidney disease: Secondary | ICD-10-CM | POA: Diagnosis not present

## 2018-04-09 DIAGNOSIS — M6281 Muscle weakness (generalized): Secondary | ICD-10-CM | POA: Diagnosis not present

## 2018-04-09 DIAGNOSIS — I5043 Acute on chronic combined systolic (congestive) and diastolic (congestive) heart failure: Secondary | ICD-10-CM | POA: Diagnosis not present

## 2018-04-10 DIAGNOSIS — I5043 Acute on chronic combined systolic (congestive) and diastolic (congestive) heart failure: Secondary | ICD-10-CM | POA: Diagnosis not present

## 2018-04-10 DIAGNOSIS — N186 End stage renal disease: Secondary | ICD-10-CM | POA: Diagnosis not present

## 2018-04-10 DIAGNOSIS — M6281 Muscle weakness (generalized): Secondary | ICD-10-CM | POA: Diagnosis not present

## 2018-04-10 DIAGNOSIS — D631 Anemia in chronic kidney disease: Secondary | ICD-10-CM | POA: Diagnosis not present

## 2018-04-10 DIAGNOSIS — E1129 Type 2 diabetes mellitus with other diabetic kidney complication: Secondary | ICD-10-CM | POA: Diagnosis not present

## 2018-04-10 DIAGNOSIS — Z992 Dependence on renal dialysis: Secondary | ICD-10-CM | POA: Diagnosis not present

## 2018-04-10 DIAGNOSIS — N2581 Secondary hyperparathyroidism of renal origin: Secondary | ICD-10-CM | POA: Diagnosis not present

## 2018-04-11 DIAGNOSIS — I5043 Acute on chronic combined systolic (congestive) and diastolic (congestive) heart failure: Secondary | ICD-10-CM | POA: Diagnosis not present

## 2018-04-11 DIAGNOSIS — M6281 Muscle weakness (generalized): Secondary | ICD-10-CM | POA: Diagnosis not present

## 2018-04-12 DIAGNOSIS — M6281 Muscle weakness (generalized): Secondary | ICD-10-CM | POA: Diagnosis not present

## 2018-04-12 DIAGNOSIS — E1129 Type 2 diabetes mellitus with other diabetic kidney complication: Secondary | ICD-10-CM | POA: Diagnosis not present

## 2018-04-12 DIAGNOSIS — N2581 Secondary hyperparathyroidism of renal origin: Secondary | ICD-10-CM | POA: Diagnosis not present

## 2018-04-12 DIAGNOSIS — D631 Anemia in chronic kidney disease: Secondary | ICD-10-CM | POA: Diagnosis not present

## 2018-04-12 DIAGNOSIS — N186 End stage renal disease: Secondary | ICD-10-CM | POA: Diagnosis not present

## 2018-04-12 DIAGNOSIS — I5043 Acute on chronic combined systolic (congestive) and diastolic (congestive) heart failure: Secondary | ICD-10-CM | POA: Diagnosis not present

## 2018-04-12 DIAGNOSIS — Z992 Dependence on renal dialysis: Secondary | ICD-10-CM | POA: Diagnosis not present

## 2018-04-15 DIAGNOSIS — M6281 Muscle weakness (generalized): Secondary | ICD-10-CM | POA: Diagnosis not present

## 2018-04-15 DIAGNOSIS — D631 Anemia in chronic kidney disease: Secondary | ICD-10-CM | POA: Diagnosis not present

## 2018-04-15 DIAGNOSIS — E1129 Type 2 diabetes mellitus with other diabetic kidney complication: Secondary | ICD-10-CM | POA: Diagnosis not present

## 2018-04-15 DIAGNOSIS — N2581 Secondary hyperparathyroidism of renal origin: Secondary | ICD-10-CM | POA: Diagnosis not present

## 2018-04-15 DIAGNOSIS — Z992 Dependence on renal dialysis: Secondary | ICD-10-CM | POA: Diagnosis not present

## 2018-04-15 DIAGNOSIS — I5043 Acute on chronic combined systolic (congestive) and diastolic (congestive) heart failure: Secondary | ICD-10-CM | POA: Diagnosis not present

## 2018-04-15 DIAGNOSIS — N186 End stage renal disease: Secondary | ICD-10-CM | POA: Diagnosis not present

## 2018-04-16 DIAGNOSIS — I5043 Acute on chronic combined systolic (congestive) and diastolic (congestive) heart failure: Secondary | ICD-10-CM | POA: Diagnosis not present

## 2018-04-16 DIAGNOSIS — M6281 Muscle weakness (generalized): Secondary | ICD-10-CM | POA: Diagnosis not present

## 2018-04-17 DIAGNOSIS — M6281 Muscle weakness (generalized): Secondary | ICD-10-CM | POA: Diagnosis not present

## 2018-04-17 DIAGNOSIS — N2581 Secondary hyperparathyroidism of renal origin: Secondary | ICD-10-CM | POA: Diagnosis not present

## 2018-04-17 DIAGNOSIS — Z992 Dependence on renal dialysis: Secondary | ICD-10-CM | POA: Diagnosis not present

## 2018-04-17 DIAGNOSIS — D631 Anemia in chronic kidney disease: Secondary | ICD-10-CM | POA: Diagnosis not present

## 2018-04-17 DIAGNOSIS — I5043 Acute on chronic combined systolic (congestive) and diastolic (congestive) heart failure: Secondary | ICD-10-CM | POA: Diagnosis not present

## 2018-04-17 DIAGNOSIS — N186 End stage renal disease: Secondary | ICD-10-CM | POA: Diagnosis not present

## 2018-04-17 DIAGNOSIS — E1129 Type 2 diabetes mellitus with other diabetic kidney complication: Secondary | ICD-10-CM | POA: Diagnosis not present

## 2018-04-18 DIAGNOSIS — F200 Paranoid schizophrenia: Secondary | ICD-10-CM | POA: Diagnosis not present

## 2018-04-18 DIAGNOSIS — I5043 Acute on chronic combined systolic (congestive) and diastolic (congestive) heart failure: Secondary | ICD-10-CM | POA: Diagnosis not present

## 2018-04-18 DIAGNOSIS — M6281 Muscle weakness (generalized): Secondary | ICD-10-CM | POA: Diagnosis not present

## 2018-04-19 DIAGNOSIS — D631 Anemia in chronic kidney disease: Secondary | ICD-10-CM | POA: Diagnosis not present

## 2018-04-19 DIAGNOSIS — M6281 Muscle weakness (generalized): Secondary | ICD-10-CM | POA: Diagnosis not present

## 2018-04-19 DIAGNOSIS — I5043 Acute on chronic combined systolic (congestive) and diastolic (congestive) heart failure: Secondary | ICD-10-CM | POA: Diagnosis not present

## 2018-04-19 DIAGNOSIS — N186 End stage renal disease: Secondary | ICD-10-CM | POA: Diagnosis not present

## 2018-04-19 DIAGNOSIS — Z992 Dependence on renal dialysis: Secondary | ICD-10-CM | POA: Diagnosis not present

## 2018-04-19 DIAGNOSIS — E1129 Type 2 diabetes mellitus with other diabetic kidney complication: Secondary | ICD-10-CM | POA: Diagnosis not present

## 2018-04-19 DIAGNOSIS — N2581 Secondary hyperparathyroidism of renal origin: Secondary | ICD-10-CM | POA: Diagnosis not present

## 2018-04-21 DIAGNOSIS — M6281 Muscle weakness (generalized): Secondary | ICD-10-CM | POA: Diagnosis not present

## 2018-04-21 DIAGNOSIS — I5043 Acute on chronic combined systolic (congestive) and diastolic (congestive) heart failure: Secondary | ICD-10-CM | POA: Diagnosis not present

## 2018-04-22 ENCOUNTER — Encounter: Payer: Self-pay | Admitting: Adult Health

## 2018-04-22 ENCOUNTER — Non-Acute Institutional Stay (SKILLED_NURSING_FACILITY): Payer: Medicare Other | Admitting: Adult Health

## 2018-04-22 DIAGNOSIS — N186 End stage renal disease: Secondary | ICD-10-CM | POA: Diagnosis not present

## 2018-04-22 DIAGNOSIS — M6281 Muscle weakness (generalized): Secondary | ICD-10-CM | POA: Diagnosis not present

## 2018-04-22 DIAGNOSIS — E1169 Type 2 diabetes mellitus with other specified complication: Secondary | ICD-10-CM

## 2018-04-22 DIAGNOSIS — E785 Hyperlipidemia, unspecified: Secondary | ICD-10-CM | POA: Diagnosis not present

## 2018-04-22 DIAGNOSIS — D631 Anemia in chronic kidney disease: Secondary | ICD-10-CM

## 2018-04-22 DIAGNOSIS — I120 Hypertensive chronic kidney disease with stage 5 chronic kidney disease or end stage renal disease: Secondary | ICD-10-CM

## 2018-04-22 DIAGNOSIS — I110 Hypertensive heart disease with heart failure: Secondary | ICD-10-CM

## 2018-04-22 DIAGNOSIS — I5042 Chronic combined systolic (congestive) and diastolic (congestive) heart failure: Secondary | ICD-10-CM | POA: Diagnosis not present

## 2018-04-22 DIAGNOSIS — N2581 Secondary hyperparathyroidism of renal origin: Secondary | ICD-10-CM | POA: Diagnosis not present

## 2018-04-22 DIAGNOSIS — Z992 Dependence on renal dialysis: Secondary | ICD-10-CM | POA: Diagnosis not present

## 2018-04-22 DIAGNOSIS — E1129 Type 2 diabetes mellitus with other diabetic kidney complication: Secondary | ICD-10-CM | POA: Diagnosis not present

## 2018-04-22 DIAGNOSIS — I5043 Acute on chronic combined systolic (congestive) and diastolic (congestive) heart failure: Secondary | ICD-10-CM | POA: Diagnosis not present

## 2018-04-22 NOTE — Progress Notes (Signed)
Location:   [facility] Room Number: 202 A Place of Service:  SNF (31)   CODE STATUS: Full Code (Most form updated 06/07/15)  No Known Allergies  Chief Complaint  Patient presents with  . Medical Management of Chronic Issues    Hypertension; dyslipidemia; esrd; anemia.     HPI:  She is a 62 year old [place] term resident of this facility being seen for the management of her chronic illnesses: hypertension; esrd; dyslipidemia anemia. She is unable to participate in the hpi or ros; there are no reports of uncontrolled pain; no changes in appetite; no reports of behavioral issues. There are no nursing concerns at this time.   Past Medical History:  Diagnosis Date  . Anemia    Due to fibroids.  . Bilateral leg edema    a. Chronic.  Marland Kitchen Blind   . Cardiac arrest ([place]) 12/12/2014  . CHF (congestive heart failure) ([place])   . Coronary artery disease   . Deaf    USE SIGN INTERPRETER.  Divorced from husband but lives with him. Has daughter but she does not care for her.  . Diabetes mellitus    a. Per PCP note 2012 (A1C 9.2) - pt unwilling to take meds and was educated on risk of uncontrolled DM.  b. A1C 5.9 in 11/2013.  Marland Kitchen ESRD on hemodialysis ([place])    Mon, Wed, Fri  . Essential hypertension 08/09/2009   Qualifier: Diagnosis of  By: Sarita Haver  MD, Coralyn Mark    . Fibroid uterus   . Heart murmur   . Hyperlipidemia   . Hypertension    a. Has previously refused blood pressure meds.   . Leiomyoma of uterus 08/09/2009   Qualifier: Diagnosis of  By: Sarita Haver  MD, Coralyn Mark    . MI (myocardial infarction) ([place]) 11/2014  . Moderate tricuspid regurgitation   . Osteomyelitis ([place])   . Psychiatric disorder    She frequently exhibits paranoia and has been diagnosed with psychotic d/o NOS during hospital stay in the past. She is tangetial and perseverative during her visits. She apparently has had a bad experience with mental health in [place] in the past and refuses to discuss mental issues for  fear that she will be sent back there. Her paranoia, communication issues, financial woes and lack of fam  . Pulmonary hypertension ([place])     Past Surgical History:  Procedure Laterality Date  . AMPUTATION Bilateral 04/02/2015   Procedure: Amputation Bilateral Great Toes MTP Joint;  Surgeon: Newt Minion, MD;  Location: [place];  Service: Orthopedics;  Laterality: Bilateral;  . AMPUTATION Left 05/14/2015   Procedure: Left Transmetatarsal Amputation;  Surgeon: Newt Minion, MD;  Location: [place];  Service: Orthopedics;  Laterality: Left;  . AMPUTATION Right 08/20/2015   Procedure: Transmetatarsal Amputation Right Foot;  Surgeon: Newt Minion, MD;  Location: [place];  Service: Orthopedics;  Laterality: Right;  . AV FISTULA PLACEMENT Left 01/02/2014   Procedure: ARTERIOVENOUS (AV) FISTULA CREATION;  Surgeon: Rosetta Posner, MD;  Location: [place];  Service: Vascular;  Laterality: Left;  . INSERTION OF DIALYSIS CATHETER Right 01/02/2014   Procedure: INSERTION OF DIALYSIS CATHETER;  Surgeon: Rosetta Posner, MD;  Location: [place];  Service: Vascular;  Laterality: Right;  . NO PAST SURGERIES    . ORIF PATELLA Left 05/17/2015   Procedure: OPEN REDUCTION INTERNAL (ORIF) FIXATION PATELLA;  Surgeon: Newt Minion, MD;  Location: [place];  Service: Orthopedics;  Laterality: Left;  Social History   Socioeconomic History  . Marital status: Widowed    Spouse name: Not on file  . Number of children: Not on file  . Years of education: Not on file  . Highest education level: Not on file  Occupational History  . Not on file  Social Needs  . Financial resource strain: Not on file  . Food insecurity:    Worry: Not on file    Inability: Not on file  . Transportation needs:    Medical: Not on file    Non-medical: Not on file  Tobacco Use  . Smoking status: Never Smoker  . Smokeless tobacco: Never Used  . Tobacco comment: Prior dip  Substance and Sexual Activity  . Alcohol use: No  . Drug use: No  . Sexual  activity: Not on file  Lifestyle  . Physical activity:    Days per week: Not on file    Minutes per session: Not on file  . Stress: Not on file  Relationships  . Social connections:    Talks on phone: Not on file    Gets together: Not on file    Attends religious service: Not on file    Active member of club or organization: Not on file    Attends meetings of clubs or organizations: Not on file    Relationship status: Not on file  . Intimate partner violence:    Fear of current or ex partner: Not on file    Emotionally abused: Not on file    Physically abused: Not on file    Forced sexual activity: Not on file  Other Topics Concern  . Not on file  Social History Narrative  . Not on file   Family History  Problem Relation Age of Onset  . Other Father        Drowned from fishing?  . Heart disease Unknown       VITAL SIGNS BP 128/74   Pulse 64   Temp (!) 97.4 F (36.3 C)   Resp 14   Ht 5\' 5"  (1.651 m)   Wt 150 lb 9.6 oz (68.3 kg)   SpO2 97%   BMI 25.06 kg/m   Outpatient Encounter Medications as of 04/22/2018  Medication Sig  . Amino Acids-Protein Hydrolys (FEEDING SUPPLEMENT, PRO-STAT SUGAR FREE 64,) LIQD Take 60 mLs by mouth 2 (two) times daily.  Marland Kitchen amiodarone (PACERONE) 200 MG tablet Take 200 mg by mouth every evening.   . ARIPiprazole (ABILIFY) 10 MG tablet Take 10 mg by mouth at bedtime. Give with Abilify 2mg  to equal 12 mg  . ARIPiprazole (ABILIFY) 2 MG tablet Take 2 mg by mouth daily. Give with Abilify 10mg  to equal 12 mg  . atorvastatin (LIPITOR) 10 MG tablet Take 10 mg by mouth every evening.  . calcium acetate (PHOSLO) 667 MG tablet Take 3 tablets by mouth before meals every Sunday, Tuesday, Thursday and Saturday for Dialysis.  Give 3 tablets by mouth two times a day every Mon, Wed, Friday for renal buffer  . cinacalcet (SENSIPAR) 30 MG tablet Take 30 mg by mouth daily.  . clopidogrel (PLAVIX) 75 MG tablet Take 75 mg by mouth every evening.  . insulin aspart  (NOVOLOG) 100 UNIT/ML injection Inject as per sliding scale subcutaneously after meals 0 - 70 = 0 units, notify MD is BS is less than 70 71 - 450 = 5 units. Notify MD if BS is greater than 450  . Insulin Glargine (BASAGLAR KWIKPEN) 100 UNIT/ML  SOPN Inject 7 Units into the skin at bedtime.  . metoprolol succinate (TOPROL-XL) 50 MG 24 hr tablet Take 50 mg by mouth every evening. Take with or immediately following a meal.  . nitroGLYCERIN (NITROSTAT) 0.4 MG SL tablet Place 0.4 mg under the tongue every 5 (five) minutes as needed for chest pain.  . polyethylene glycol (MIRALAX / GLYCOLAX) packet Take 17 g by mouth daily.   . traMADol (ULTRAM) 50 MG tablet Take 1 tablet (50 mg total) by mouth daily.  Marland Kitchen UNABLE TO FIND Fluid Restriction - 1200 cc per day - on Dialysis   No facility-administered encounter medications on file as of 04/22/2018.      SIGNIFICANT DIAGNOSTIC EXAMS  PREVIOUS  11-24-14: Myoview: 1. Fixed defects involving the apex, anterior lateral wall and the inferior wall. The apex and anterior lateral wall fixed defects aresuggestive for an infarct. No evidence for reversibility or ischemia. 2. Diffuse hypokinesia and minimal wall motion along the lateral wall. 3. Left ventricular ejection fraction is 35%. 4. High-risk stress test findings*.  04-16-15: 2-d echo: - Left ventricle: The cavity size was mildly dilated. Wall thickness was increased in a pattern of mild LVH. Systolic function was moderately to severely reduced. The estimated ejection fraction was in the range of 30% to 35%. There is akinesis of the anteroseptal and apical myocardium. Features are consistent with a pseudonormal left ventricular filling pattern, with concomitant abnormal relaxation and increased filling pressure (grade 2 diastolic dysfunction). Doppler parameters are consistent with high ventricular filling pressure. - Mitral valve: Calcified annulus. There was mild regurgitation. - Left atrium: The atrium was  mildly dilated. - Pulmonary arteries: Systolic pressure was mildly increased.   NO NEW EXAMS   LABS REVIEWED: she will decline labs PREVIOUS     04-06-17: wbc 5.1; hgb 11.3; hct 34.2 mcv 96.3; plt 208; tsh 0.8; hgb a1c 5.5 chol 197; ldl 130; trig 51; hdl 57  08-01-17: wbc 7.0; hgb 11.0; hct 33.3; mcv 97.1; plt 247; glucose 95; bun 64.2; creat 5.86; k+ 5.5; na++ 132; liver normal albumin 4.5 11-10-17: wbc 6.0; hgb 10.9; hct 34.7; mcv 88.8; plt 275; glucose 421; bun 25.3; creat 1.11; k+ 4.5; na++ 143; ca 8.6; liver normal albumin 3.1; tsh 3.07; hgb a1c 10.8; chol 170; ldl 101; trig 165; hdl 36   NO NEW LABS     Review of Systems  Unable to perform ROS: Other (deaf and blind )    Physical Exam  Constitutional: She appears well-developed and well-nourished. No distress.  Neck: No thyromegaly present.  Cardiovascular: Normal rate, regular rhythm and intact distal pulses.  Murmur heard. Pulmonary/Chest: Effort normal and breath sounds normal. No respiratory distress.  Abdominal: Soft. Bowel sounds are normal. She exhibits no distension. There is no tenderness.  Musculoskeletal: She exhibits no edema.  Status post bilateral transmetatarsal amputations  Is able to move all extremities      Lymphadenopathy:    She has no cervical adenopathy.  Neurological: She is alert.  Skin: Skin is warm and dry. She is not diaphoretic.  Left upper arm a/v fistula: + thrill + bruitt        Psychiatric: She has a normal mood and affect.    ASSESSMENT/ PLAN:  TODAY:   1. Dyslipidemia associated with type 2 diabetes mellitus:  Without change; ldl 165; will continue lipitor 10 mg daily    2. ESRD on hemodialysis: is followed by nephrology. Will continue dialysis as scheduled. Will continue phoslo 667 mg 3 tabs  three times daily on Sunday; Tuesday; Thursday; Saturday; give 3 tabs twice daily on Monday; Wednesday; Friday.    3. 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  Benign hypertension with ESRD and hypertensive heart disease with chronic combined heart failure : 128/74 is presently stable will continue toprol xl 50 mg daily   PREVIOUS   5. PAD: is without change is status post left and right transmetatarsal amputation: will continue plavix 75 mg daily  Will continue ultram 50 mg daily for pain management   6. Chronic combined systolic and diastolic heart failure: has ischemic cardiomyopathy  EF is 30-35%  (04-16-15); is presently stable will continue toprol xl 50 mg daily  7. Sustained ventricular trachycardia: not a candidate for ICD. Is on amiodarone 200 mg daily for heart rate control will monitor   8. FTT in adult: is stable her current weight is 150 pounds; her albumin is 4.0. Will continue supplements per facility protocol.   9. Psychosis; paranoid : is stable will continue abilify 12 mg daily .   10. Chronic constipation: is stable will continue miralax daily  11. Uncontrolled type 2 diabetes mellitus with end stage renal disease: without changes hgb a1c 10.8 (previous 5.5)  Will continue lantus 10 units; novolog 5 units after meals   She is on plavix and is esrd.      MD is aware of resident's narcotic use and is in agreement with current plan of care. We will attempt to wean resident as apropriate   Ok Edwards NP [facility]  Contact [phone] Monday through Friday 8am- 5pm  After hours call [phone]

## 2018-04-23 DIAGNOSIS — D649 Anemia, unspecified: Secondary | ICD-10-CM | POA: Diagnosis not present

## 2018-04-23 DIAGNOSIS — E119 Type 2 diabetes mellitus without complications: Secondary | ICD-10-CM | POA: Diagnosis not present

## 2018-04-23 DIAGNOSIS — E1122 Type 2 diabetes mellitus with diabetic chronic kidney disease: Secondary | ICD-10-CM | POA: Diagnosis not present

## 2018-04-23 DIAGNOSIS — E878 Other disorders of electrolyte and fluid balance, not elsewhere classified: Secondary | ICD-10-CM | POA: Diagnosis not present

## 2018-04-23 DIAGNOSIS — E785 Hyperlipidemia, unspecified: Secondary | ICD-10-CM | POA: Diagnosis not present

## 2018-04-23 DIAGNOSIS — M6281 Muscle weakness (generalized): Secondary | ICD-10-CM | POA: Diagnosis not present

## 2018-04-23 DIAGNOSIS — I5043 Acute on chronic combined systolic (congestive) and diastolic (congestive) heart failure: Secondary | ICD-10-CM | POA: Diagnosis not present

## 2018-04-23 DIAGNOSIS — Z79899 Other long term (current) drug therapy: Secondary | ICD-10-CM | POA: Diagnosis not present

## 2018-04-23 LAB — HEPATIC FUNCTION PANEL
ALT: 8 (ref 7–35)
AST: 8 — AB (ref 13–35)
Alkaline Phosphatase: 99 (ref 25–125)
Bilirubin, Total: 0.4

## 2018-04-23 LAB — BASIC METABOLIC PANEL
BUN: 38 — AB (ref 4–21)
Creatinine: 5.9 — AB (ref 0.5–1.1)
Glucose: 102
Potassium: 5.3 (ref 3.4–5.3)
Sodium: 138 (ref 137–147)

## 2018-04-23 LAB — CBC AND DIFFERENTIAL
HCT: 35 — AB (ref 36–46)
Hemoglobin: 11.4 — AB (ref 12.0–16.0)
Neutrophils Absolute: 2
Platelets: 207 (ref 150–399)
WBC: 5

## 2018-04-23 LAB — HEMOGLOBIN A1C: Hemoglobin A1C: 5.5

## 2018-04-24 ENCOUNTER — Other Ambulatory Visit: Payer: Self-pay

## 2018-04-24 DIAGNOSIS — D631 Anemia in chronic kidney disease: Secondary | ICD-10-CM | POA: Diagnosis not present

## 2018-04-24 DIAGNOSIS — N2581 Secondary hyperparathyroidism of renal origin: Secondary | ICD-10-CM | POA: Diagnosis not present

## 2018-04-24 DIAGNOSIS — N186 End stage renal disease: Secondary | ICD-10-CM | POA: Diagnosis not present

## 2018-04-24 DIAGNOSIS — E1129 Type 2 diabetes mellitus with other diabetic kidney complication: Secondary | ICD-10-CM | POA: Diagnosis not present

## 2018-04-24 DIAGNOSIS — Z992 Dependence on renal dialysis: Secondary | ICD-10-CM | POA: Diagnosis not present

## 2018-04-24 MED ORDER — TRAMADOL HCL 50 MG PO TABS: 50.0000 mg | ORAL_TABLET | Freq: Every day | ORAL | 0 refills | Status: DC

## 2018-04-24 NOTE — Telephone Encounter (Signed)
Rx faxed to [facility] (p) [phone], (f) [phone]  

## 2018-04-26 DIAGNOSIS — N186 End stage renal disease: Secondary | ICD-10-CM | POA: Diagnosis not present

## 2018-04-26 DIAGNOSIS — N2581 Secondary hyperparathyroidism of renal origin: Secondary | ICD-10-CM | POA: Diagnosis not present

## 2018-04-26 DIAGNOSIS — D631 Anemia in chronic kidney disease: Secondary | ICD-10-CM | POA: Diagnosis not present

## 2018-04-26 DIAGNOSIS — Z992 Dependence on renal dialysis: Secondary | ICD-10-CM | POA: Diagnosis not present

## 2018-04-26 DIAGNOSIS — E1129 Type 2 diabetes mellitus with other diabetic kidney complication: Secondary | ICD-10-CM | POA: Diagnosis not present

## 2018-04-29 DIAGNOSIS — D631 Anemia in chronic kidney disease: Secondary | ICD-10-CM | POA: Diagnosis not present

## 2018-04-29 DIAGNOSIS — N2581 Secondary hyperparathyroidism of renal origin: Secondary | ICD-10-CM | POA: Diagnosis not present

## 2018-04-29 DIAGNOSIS — Z992 Dependence on renal dialysis: Secondary | ICD-10-CM | POA: Diagnosis not present

## 2018-04-29 DIAGNOSIS — E1129 Type 2 diabetes mellitus with other diabetic kidney complication: Secondary | ICD-10-CM | POA: Diagnosis not present

## 2018-04-29 DIAGNOSIS — N186 End stage renal disease: Secondary | ICD-10-CM | POA: Diagnosis not present

## 2018-04-30 ENCOUNTER — Other Ambulatory Visit: Payer: Self-pay

## 2018-04-30 ENCOUNTER — Encounter (HOSPITAL_COMMUNITY): Payer: Self-pay | Admitting: Emergency Medicine

## 2018-04-30 ENCOUNTER — Inpatient Hospital Stay (HOSPITAL_COMMUNITY)
Admission: EM | Admit: 2018-04-30 | Discharge: 2018-05-07 | DRG: 882 | Disposition: A | Discharge status: Skilled Nursing Facility | Payer: Medicare Other | Attending: Internal Medicine | Admitting: Internal Medicine

## 2018-04-30 DIAGNOSIS — E8889 Other specified metabolic disorders: Secondary | ICD-10-CM | POA: Diagnosis present

## 2018-04-30 DIAGNOSIS — IMO0002 Reserved for concepts with insufficient information to code with codable children: Secondary | ICD-10-CM | POA: Diagnosis present

## 2018-04-30 DIAGNOSIS — E785 Hyperlipidemia, unspecified: Secondary | ICD-10-CM | POA: Diagnosis not present

## 2018-04-30 DIAGNOSIS — I5042 Chronic combined systolic (congestive) and diastolic (congestive) heart failure: Secondary | ICD-10-CM | POA: Diagnosis present

## 2018-04-30 DIAGNOSIS — R410 Disorientation, unspecified: Secondary | ICD-10-CM | POA: Diagnosis not present

## 2018-04-30 DIAGNOSIS — I252 Old myocardial infarction: Secondary | ICD-10-CM

## 2018-04-30 DIAGNOSIS — E878 Other disorders of electrolyte and fluid balance, not elsewhere classified: Secondary | ICD-10-CM | POA: Diagnosis not present

## 2018-04-30 DIAGNOSIS — H9190 Unspecified hearing loss, unspecified ear: Secondary | ICD-10-CM | POA: Diagnosis present

## 2018-04-30 DIAGNOSIS — E1165 Type 2 diabetes mellitus with hyperglycemia: Secondary | ICD-10-CM

## 2018-04-30 DIAGNOSIS — R031 Nonspecific low blood-pressure reading: Secondary | ICD-10-CM | POA: Diagnosis not present

## 2018-04-30 DIAGNOSIS — E875 Hyperkalemia: Secondary | ICD-10-CM | POA: Diagnosis present

## 2018-04-30 DIAGNOSIS — N2581 Secondary hyperparathyroidism of renal origin: Secondary | ICD-10-CM | POA: Diagnosis not present

## 2018-04-30 DIAGNOSIS — G253 Myoclonus: Secondary | ICD-10-CM | POA: Diagnosis not present

## 2018-04-30 DIAGNOSIS — I132 Hypertensive heart and chronic kidney disease with heart failure and with stage 5 chronic kidney disease, or end stage renal disease: Secondary | ICD-10-CM | POA: Diagnosis present

## 2018-04-30 DIAGNOSIS — G9340 Encephalopathy, unspecified: Secondary | ICD-10-CM | POA: Diagnosis present

## 2018-04-30 DIAGNOSIS — Z79891 Long term (current) use of opiate analgesic: Secondary | ICD-10-CM

## 2018-04-30 DIAGNOSIS — I071 Rheumatic tricuspid insufficiency: Secondary | ICD-10-CM | POA: Diagnosis present

## 2018-04-30 DIAGNOSIS — N186 End stage renal disease: Secondary | ICD-10-CM | POA: Diagnosis not present

## 2018-04-30 DIAGNOSIS — R4182 Altered mental status, unspecified: Secondary | ICD-10-CM | POA: Diagnosis not present

## 2018-04-30 DIAGNOSIS — E1122 Type 2 diabetes mellitus with diabetic chronic kidney disease: Secondary | ICD-10-CM | POA: Diagnosis present

## 2018-04-30 DIAGNOSIS — Z992 Dependence on renal dialysis: Secondary | ICD-10-CM

## 2018-04-30 DIAGNOSIS — Z8674 Personal history of sudden cardiac arrest: Secondary | ICD-10-CM

## 2018-04-30 DIAGNOSIS — D631 Anemia in chronic kidney disease: Secondary | ICD-10-CM | POA: Diagnosis present

## 2018-04-30 DIAGNOSIS — H547 Unspecified visual loss: Secondary | ICD-10-CM

## 2018-04-30 DIAGNOSIS — F4324 Adjustment disorder with disturbance of conduct: Secondary | ICD-10-CM | POA: Diagnosis not present

## 2018-04-30 DIAGNOSIS — I272 Pulmonary hypertension, unspecified: Secondary | ICD-10-CM | POA: Diagnosis present

## 2018-04-30 DIAGNOSIS — G9341 Metabolic encephalopathy: Secondary | ICD-10-CM | POA: Diagnosis not present

## 2018-04-30 DIAGNOSIS — F22 Delusional disorders: Secondary | ICD-10-CM | POA: Diagnosis present

## 2018-04-30 DIAGNOSIS — I493 Ventricular premature depolarization: Secondary | ICD-10-CM | POA: Diagnosis present

## 2018-04-30 DIAGNOSIS — Z79899 Other long term (current) drug therapy: Secondary | ICD-10-CM

## 2018-04-30 DIAGNOSIS — I120 Hypertensive chronic kidney disease with stage 5 chronic kidney disease or end stage renal disease: Secondary | ICD-10-CM | POA: Diagnosis present

## 2018-04-30 DIAGNOSIS — Z7902 Long term (current) use of antithrombotics/antiplatelets: Secondary | ICD-10-CM

## 2018-04-30 DIAGNOSIS — E11649 Type 2 diabetes mellitus with hypoglycemia without coma: Secondary | ICD-10-CM | POA: Diagnosis present

## 2018-04-30 DIAGNOSIS — I2510 Atherosclerotic heart disease of native coronary artery without angina pectoris: Secondary | ICD-10-CM | POA: Diagnosis present

## 2018-04-30 DIAGNOSIS — E1151 Type 2 diabetes mellitus with diabetic peripheral angiopathy without gangrene: Secondary | ICD-10-CM | POA: Diagnosis present

## 2018-04-30 LAB — I-STAT CHEM 8, ED
BUN: 49 mg/dL — ABNORMAL HIGH (ref 6–20)
Calcium, Ion: 1.04 mmol/L — ABNORMAL LOW (ref 1.15–1.40)
Chloride: 98 mmol/L — ABNORMAL LOW (ref 101–111)
Creatinine, Ser: 8.3 mg/dL — ABNORMAL HIGH (ref 0.44–1.00)
Glucose, Bld: 177 mg/dL — ABNORMAL HIGH (ref 65–99)
HCT: 37 % (ref 36.0–46.0)
Hemoglobin: 12.6 g/dL (ref 12.0–15.0)
Potassium: 5.6 mmol/L — ABNORMAL HIGH (ref 3.5–5.1)
Sodium: 134 mmol/L — ABNORMAL LOW (ref 135–145)
TCO2: 27 mmol/L (ref 22–32)

## 2018-04-30 LAB — LIPID PANEL
Cholesterol: 165 (ref 0–200)
HDL: 50 (ref 35–70)
LDL Cholesterol: 103
Triglycerides: 57 (ref 40–160)

## 2018-04-30 LAB — CBC WITH DIFFERENTIAL/PLATELET
Basophils Absolute: 0 10*3/uL (ref 0.0–0.1)
Basophils Relative: 0 %
Eosinophils Absolute: 0.1 10*3/uL (ref 0.0–0.7)
Eosinophils Relative: 1 %
HCT: 36 % (ref 36.0–46.0)
Hemoglobin: 11.5 g/dL — ABNORMAL LOW (ref 12.0–15.0)
Lymphocytes Relative: 24 %
Lymphs Abs: 2 10*3/uL (ref 0.7–4.0)
MCH: 31.3 pg (ref 26.0–34.0)
MCHC: 31.9 g/dL (ref 30.0–36.0)
MCV: 98.1 fL (ref 78.0–100.0)
Monocytes Absolute: 0.5 10*3/uL (ref 0.1–1.0)
Monocytes Relative: 6 %
Neutro Abs: 5.7 10*3/uL (ref 1.7–7.7)
Neutrophils Relative %: 69 %
Platelets: 257 10*3/uL (ref 150–400)
RBC: 3.67 MIL/uL — ABNORMAL LOW (ref 3.87–5.11)
RDW: 16.9 % — ABNORMAL HIGH (ref 11.5–15.5)
WBC: 8.2 10*3/uL (ref 4.0–10.5)

## 2018-04-30 LAB — CBG MONITORING, ED: Glucose-Capillary: 164 mg/dL — ABNORMAL HIGH (ref 65–99)

## 2018-04-30 NOTE — ED Notes (Signed)
[name]- from [facility] interpreter- was here to assist with [name] interpretation but Son of [name] that is currently here has a better understanding of [name]'s needs for interpretation. Son is willing to assist in interpreting for [name].

## 2018-04-30 NOTE — ED Triage Notes (Signed)
Pt arrived via [place] from [facility] facility.  Pt is blind and deaf but can normally feed herself.  Facility reports patients unable to feed herself tonight due to "tremors". VS 212/98, pulse 80, CBG 161 per EMS.

## 2018-04-30 NOTE — ED Notes (Signed)
Contacted the sign language interpreter

## 2018-04-30 NOTE — ED Notes (Addendum)
Unable to communicate with patient due to her being blind and deaf.  According to the facility she normally writes on your arm with her finger but tonight she is having trouble doing that. She appears to be signing. Will consult an interpreter.

## 2018-04-30 NOTE — ED Notes (Signed)
Hand to hand interpreter Mechele Claude should be here by 2130

## 2018-04-30 NOTE — ED Provider Notes (Signed)
[facility] EMERGENCY DEPARTMENT Provider Note   CSN: [id number] Arrival date & time: 04/30/18  1925     History   Chief Complaint Chief Complaint  Patient presents with  . Tremors    HPI [name] is a 62 y.o. female.  HPI Patient is a 62 year old female with complex medical history as below, notable for ESRD on Monday Wednesday Friday dialysis, deafness, blindness, CHF, hypertension, hyperlipidemia, pulmonary hypertension, and unspecified psychiatric disorder.  She presents from her nursing facility after developing agitation and report of tremors this evening.  Patient is unable to further contribute to history.  I spoke to 1 of her nurses at the nursing facility who describes she has not been herself this evening.  They deny any recent illness or injury.  She is compliant with her dialysis and her medications.  Past Medical History:  Diagnosis Date  . Anemia    Due to fibroids.  . Bilateral leg edema    a. Chronic.  Marland Kitchen Blind   . Cardiac arrest ([place]) 12/12/2014  . CHF (congestive heart failure) ([place])   . Coronary artery disease   . Deaf    USE SIGN INTERPRETER.  Divorced from husband but lives with him. Has daughter but she does not care for her.  . Diabetes mellitus    a. Per PCP note 2012 (A1C 9.2) - pt unwilling to take meds and was educated on risk of uncontrolled DM.  b. A1C 5.9 in 11/2013.  Marland Kitchen ESRD on hemodialysis ([place])    Mon, Wed, Fri  . Essential hypertension 08/09/2009   Qualifier: Diagnosis of  By: Sarita Haver  MD, Coralyn Mark    . Fibroid uterus   . Heart murmur   . Hyperlipidemia   . Hypertension    a. Has previously refused blood pressure meds.   . Leiomyoma of uterus 08/09/2009   Qualifier: Diagnosis of  By: Sarita Haver  MD, Coralyn Mark    . MI (myocardial infarction) ([place]) 11/2014  . Moderate tricuspid regurgitation   . Osteomyelitis ([place])   . Psychiatric disorder    She frequently exhibits paranoia and has been diagnosed with psychotic d/o NOS  during hospital stay in the past. She is tangetial and perseverative during her visits. She apparently has had a bad experience with mental health in [place] in the past and refuses to discuss mental issues for fear that she will be sent back there. Her paranoia, communication issues, financial woes and lack of fam  . Pulmonary hypertension ([place])     Patient Active Problem List   Diagnosis Date Noted  . Benign hypertension with ESRD (end-stage renal disease) ([place]) 02/12/2018  . Uncontrolled type 2 diabetes mellitus with end-stage renal disease ([place]) 11/26/2017  . 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 Chronic combined systolic and diastolic congestive heart failure ([place]) 12/15/2015  . Dyslipidemia associated with type 2 diabetes mellitus ([place]) 12/15/2015  . Ventricular tachycardia, sustained ([place]) 12/15/2015  . Psychosis, paranoid ([place]) 08/20/2015  . Non compliance w medication regimen 08/20/2015  . Hyperkalemia 04/19/2015  . ESRD (end stage renal disease) ([place]) 04/19/2015  . PAD (peripheral artery disease) ([place]) 03/23/2015  . Acute encephalopathy   . FTT (failure to thrive) in adult 01/14/2015  . Blindness with deafness 12/16/2014  . Anemia in CKD (chronic kidney disease) 12/14/2014  . Cardiomyopathy, ischemic-EF 30-35% 11/21/2014  . Chronic constipation 12/24/2013  . Diabetes mellitus with ESRD (end-stage renal disease) ([place]) 07-29-2013  . Hypertensive heart disease with CHF (congestive heart failure) ([place]) 08/09/2009  Past Surgical History:  Procedure Laterality Date  . AMPUTATION Bilateral 04/02/2015   Procedure: Amputation Bilateral Great Toes MTP Joint;  Surgeon: Newt Minion, MD;  Location: [place];  Service: Orthopedics;  Laterality: Bilateral;  . AMPUTATION Left 05/14/2015   Procedure: Left Transmetatarsal Amputation;  Surgeon: Newt Minion, MD;  Location: [place];  Service: Orthopedics;  Laterality: Left;  . AMPUTATION Right 08/20/2015   Procedure: Transmetatarsal Amputation Right Foot;  Surgeon:  Newt Minion, MD;  Location: [place];  Service: Orthopedics;  Laterality: Right;  . AV FISTULA PLACEMENT Left 01/02/2014   Procedure: ARTERIOVENOUS (AV) FISTULA CREATION;  Surgeon: Rosetta Posner, MD;  Location: [place];  Service: Vascular;  Laterality: Left;  . INSERTION OF DIALYSIS CATHETER Right 01/02/2014   Procedure: INSERTION OF DIALYSIS CATHETER;  Surgeon: Rosetta Posner, MD;  Location: [place];  Service: Vascular;  Laterality: Right;  . NO PAST SURGERIES    . ORIF PATELLA Left 05/17/2015   Procedure: OPEN REDUCTION INTERNAL (ORIF) FIXATION PATELLA;  Surgeon: Newt Minion, MD;  Location: [place];  Service: Orthopedics;  Laterality: Left;     OB History    Gravida  1   Para  1   Term      Preterm      AB      Living  1     SAB      TAB      Ectopic      Multiple      Live Births               Home Medications    Prior to Admission medications   Medication Sig Start Date End Date Taking? Authorizing Provider  Amino Acids-Protein Hydrolys (FEEDING SUPPLEMENT, PRO-STAT SUGAR FREE 64,) LIQD Take 60 mLs by mouth 2 (two) times daily.   Yes [provider]  amiodarone (PACERONE) 200 MG tablet Take 200 mg by mouth every evening.    Yes [provider]  ARIPiprazole (ABILIFY) 2 MG tablet Take 12 mg by mouth daily.    Yes [provider]  atorvastatin (LIPITOR) 10 MG tablet Take 10 mg by mouth every evening.   Yes [provider]  calcium acetate (PHOSLO) 667 MG tablet Take 3 tablets by mouth before meals every Sunday, Tuesday, Thursday and Saturday for Dialysis.  Give 3 tablets by mouth two times a day every Mon, Wed, Friday for renal buffer   Yes [provider]  clopidogrel (PLAVIX) 75 MG tablet Take 75 mg by mouth every evening.   Yes [provider]  insulin aspart (NOVOLOG) 100 UNIT/ML injection Inject as per sliding scale subcutaneously after meals 0 - 70 = 0 units, notify MD is BS is less than 70 71 - 450 = 5  units. Notify MD if BS is greater than 450   Yes [provider]  Insulin Glargine (BASAGLAR KWIKPEN) 100 UNIT/ML SOPN Inject 7 Units into the skin at bedtime. 03/06/18  Yes [provider]  nitroGLYCERIN (NITROSTAT) 0.4 MG SL tablet Place 0.4 mg under the tongue every 5 (five) minutes as needed for chest pain.   Yes [provider]  polyethylene glycol (MIRALAX / GLYCOLAX) packet Take 17 g by mouth daily.    Yes [provider]  traMADol (ULTRAM) 50 MG tablet Take 1 tablet (50 mg total) by mouth daily. 04/24/18  Yes Gerlene Fee, NP  UNABLE TO FIND Fluid Restriction - 1200 cc per day - on Dialysis  Yes [provider]    Family History Family History  Problem Relation Age of Onset  . Other Father        Drowned from fishing?  . Heart disease Unknown     Social History Social History   Tobacco Use  . Smoking status: Never Smoker  . Smokeless tobacco: Never Used  . Tobacco comment: Prior dip  Substance Use Topics  . Alcohol use: No  . Drug use: No     Allergies   Patient has no known allergies.   Review of Systems Review of Systems  Unable to perform ROS: Patient nonverbal     Physical Exam Updated Vital Signs BP (!) 160/52   Pulse 89   Temp 99 F (37.2 C) (Oral)   Resp 16   SpO2 92%   Physical Exam  Constitutional: She appears well-developed and well-nourished. No distress.  HENT:  Head: Normocephalic and atraumatic.  Eyes: Conjunctivae are normal.  Neck: Neck supple.  Cardiovascular: Normal rate and regular rhythm.  No murmur heard. Pulmonary/Chest: Effort normal and breath sounds normal. No respiratory distress.  Abdominal: Soft. She exhibits no distension. There is no tenderness.  Musculoskeletal: She exhibits no edema.  Neurological: She is alert.  Patient is alert.  Of note, she is blind and deaf.  She is intermittently moaning.  She does have what appeared to be myoclonic jerks.  She has grossly equal  strength in her upper and lower extremities.  However, full neurologic testing is limited by her inability to follow commands.  Skin: Skin is warm and dry.  Psychiatric:  Patient appears mildly agitated.  Nursing note and vitals reviewed.    ED Treatments / Results  Labs (all labs ordered are listed, but only abnormal results are displayed) Labs Reviewed  CBC WITH DIFFERENTIAL/PLATELET - Abnormal; Notable for the following components:      Result Value   RBC 3.67 (*)    Hemoglobin 11.5 (*)    RDW 16.9 (*)    All other components within normal limits  CBG MONITORING, ED - Abnormal; Notable for the following components:   Glucose-Capillary 164 (*)    All other components within normal limits  I-STAT CHEM 8, ED - Abnormal; Notable for the following components:   Sodium 134 (*)    Potassium 5.6 (*)    Chloride 98 (*)    BUN 49 (*)    Creatinine, Ser 8.30 (*)    Glucose, Bld 177 (*)    Calcium, Ion 1.04 (*)    All other components within normal limits  URINALYSIS, ROUTINE W REFLEX MICROSCOPIC    EKG EKG Interpretation  Date/Time:  Tuesday Apr 30 2018 21:28:46 EDT Ventricular Rate:  94 PR Interval:    QRS Duration: 111 QT Interval:  352 QTC Calculation: 441 R Axis:   44 Text Interpretation:  Sinus tachycardia Ventricular premature complex Probable left atrial enlargement LVH with secondary repolarization abnormality Anterior infarct, old Minimal ST elevation, inferior leads Since last tracing no significant changes seen prior ST elevations have been seen on multiple prior EKG's Confirmed by Noemi Chapel [phone]) on 04/30/2018 9:33:13 PM   Radiology No results found.  Procedures Procedures (including critical care time)  Medications Ordered in ED Medications - No data to display   Initial Impression / Assessment and Plan / ED Course  I have reviewed the triage vital signs and the nursing notes.  Pertinent labs & imaging results that were available during my care of  the patient were  reviewed by me and considered in my medical decision making (see chart for details).    Patient is a 62 year old female with history as above who presents due to agitation, myoclonic jerks, and altered mental status.  Here, patient is overall well-appearing.  She does not appear toxic.  Vital signs are reassuring.  She initially had mild tachycardia which resolved on my exam.  EKG shows no acute ischemic changes.  She does have intermittent myoclonic jerks.  No obvious focal neurologic deficit.  I spoke to the facility who states that these jerks started this evening abruptly.  She has no history of the same.  The son has been here helping [place] communicate with the patient.  She denies any pain or other complaints.  However, she is not sure why she is here or what is going on.  The son states this is unusual for her.  Her labs are notable for mild hyperkalemia.  No EKG changes related to this.  She is due for dialysis Wednesday morning.  I had discussion with the son regarding admission versus discharge.  He is very concerned about the mother's mental status change.  We will admit her for further work-up.  We attempted to obtain a CT scan however the patient did not tolerate this due to agitation.  Given no focal neuro deficits, I will defer this for now.  Final Clinical Impressions(s) / ED Diagnoses   Final diagnoses:  Myoclonic jerking  Altered mental status, unspecified altered mental status type    ED Discharge Orders    None       Clifton James, MD 05/01/18 Judithann Sauger    Noemi Chapel, MD 05/01/18 [phone]

## 2018-04-30 NOTE — ED Provider Notes (Signed)
I saw and evaluated the patient, reviewed the resident's note and I agree with the findings and plan.  Pertinent History: patient presents with agitation but no clear complaints - she is both deaf and blind - interpreter used at the bedside - she has no complaints - she doesn't know why she is here but seemed more agitated today.  Pertinent Exam findings: has some intermittent myoclonic jerking - she moves all 4 extremities.   Labs / w/u - EKG is unchanged,   EKG Interpretation  Date/Time:  Tuesday Apr 30 2018 21:28:46 EDT Ventricular Rate:  94 PR Interval:    QRS Duration: 111 QT Interval:  352 QTC Calculation: 441 R Axis:   44 Text Interpretation:  Sinus tachycardia Ventricular premature complex Probable left atrial enlargement LVH with secondary repolarization abnormality Anterior infarct, old Minimal ST elevation, inferior leads Since last tracing no significant changes seen prior ST elevations have been seen on multiple prior EKG's Confirmed by Noemi Chapel [phone]) on 04/30/2018 9:33:13 PM      Unclear source - altered vs psych vs other.  I personally interpreted the EKG as well as the resident and agree with the interpretation on the resident's chart.  Final diagnoses:  Myoclonic jerking  Altered mental status, unspecified altered mental status type      Noemi Chapel, MD 05/01/18 [phone]

## 2018-04-30 NOTE — ED Notes (Signed)
RN attempted to communicate with patient who is deaf and blind. Unable to successfully communicate, in person translator who can communicate by drawing on skin has been called. RN attempted to place pt on cardiac monitor and obtain EKG, was able to get her on a 5 lead but unable to get the other leads on for an EKG

## 2018-05-01 ENCOUNTER — Encounter (HOSPITAL_COMMUNITY): Payer: Self-pay | Admitting: Family Medicine

## 2018-05-01 ENCOUNTER — Observation Stay (HOSPITAL_COMMUNITY): Payer: Medicare Other

## 2018-05-01 DIAGNOSIS — G9340 Encephalopathy, unspecified: Secondary | ICD-10-CM | POA: Diagnosis not present

## 2018-05-01 DIAGNOSIS — E1165 Type 2 diabetes mellitus with hyperglycemia: Secondary | ICD-10-CM

## 2018-05-01 DIAGNOSIS — E875 Hyperkalemia: Secondary | ICD-10-CM

## 2018-05-01 DIAGNOSIS — N186 End stage renal disease: Secondary | ICD-10-CM | POA: Diagnosis not present

## 2018-05-01 DIAGNOSIS — F22 Delusional disorders: Secondary | ICD-10-CM

## 2018-05-01 DIAGNOSIS — R4020 Unspecified coma: Secondary | ICD-10-CM | POA: Diagnosis not present

## 2018-05-01 DIAGNOSIS — E1122 Type 2 diabetes mellitus with diabetic chronic kidney disease: Secondary | ICD-10-CM | POA: Diagnosis not present

## 2018-05-01 LAB — BASIC METABOLIC PANEL
Anion gap: 17 — ABNORMAL HIGH (ref 5–15)
Anion gap: 13 (ref 5–15)
BUN: 59 mg/dL — ABNORMAL HIGH (ref 6–20)
BUN: 60 mg/dL — ABNORMAL HIGH (ref 6–20)
CO2: 23 mmol/L (ref 22–32)
CO2: 27 mmol/L (ref 22–32)
Calcium: 9.7 mg/dL (ref 8.9–10.3)
Calcium: 9.6 mg/dL (ref 8.9–10.3)
Chloride: 95 mmol/L — ABNORMAL LOW (ref 101–111)
Chloride: 96 mmol/L — ABNORMAL LOW (ref 101–111)
Creatinine, Ser: 8.6 mg/dL — ABNORMAL HIGH (ref 0.44–1.00)
Creatinine, Ser: 8.6 mg/dL — ABNORMAL HIGH (ref 0.44–1.00)
GFR calc Af Amer: 5 mL/min — ABNORMAL LOW (ref >60)
GFR calc Af Amer: 5 mL/min — ABNORMAL LOW (ref >60)
GFR calc non Af Amer: 4 mL/min — ABNORMAL LOW (ref >60)
GFR calc non Af Amer: 4 mL/min — ABNORMAL LOW (ref >60)
Glucose, Bld: 140 mg/dL — ABNORMAL HIGH (ref 65–99)
Glucose, Bld: 135 mg/dL — ABNORMAL HIGH (ref 65–99)
Potassium: 5.4 mmol/L — ABNORMAL HIGH (ref 3.5–5.1)
Potassium: 5.8 mmol/L — ABNORMAL HIGH (ref 3.5–5.1)
Sodium: 135 mmol/L (ref 135–145)
Sodium: 136 mmol/L (ref 135–145)

## 2018-05-01 LAB — CBC
HCT: 36.6 % (ref 36.0–46.0)
HCT: 36.2 % (ref 36.0–46.0)
Hemoglobin: 12 g/dL (ref 12.0–15.0)
Hemoglobin: 11.7 g/dL — ABNORMAL LOW (ref 12.0–15.0)
MCH: 31.9 pg (ref 26.0–34.0)
MCH: 31.3 pg (ref 26.0–34.0)
MCHC: 32.8 g/dL (ref 30.0–36.0)
MCHC: 32.3 g/dL (ref 30.0–36.0)
MCV: 97.3 fL (ref 78.0–100.0)
MCV: 96.8 fL (ref 78.0–100.0)
Platelets: 269 10*3/uL (ref 150–400)
Platelets: 276 10*3/uL (ref 150–400)
RBC: 3.76 MIL/uL — ABNORMAL LOW (ref 3.87–5.11)
RBC: 3.74 MIL/uL — ABNORMAL LOW (ref 3.87–5.11)
RDW: 17.1 % — ABNORMAL HIGH (ref 11.5–15.5)
RDW: 17.2 % — ABNORMAL HIGH (ref 11.5–15.5)
WBC: 8.5 10*3/uL (ref 4.0–10.5)
WBC: 7.8 10*3/uL (ref 4.0–10.5)

## 2018-05-01 LAB — RENAL FUNCTION PANEL
Albumin: 3.8 g/dL (ref 3.5–5.0)
Anion gap: 11 (ref 5–15)
BUN: 49 mg/dL — ABNORMAL HIGH (ref 6–20)
CO2: 26 mmol/L (ref 22–32)
Calcium: 9.2 mg/dL (ref 8.9–10.3)
Chloride: 99 mmol/L — ABNORMAL LOW (ref 101–111)
Creatinine, Ser: 7.04 mg/dL — ABNORMAL HIGH (ref 0.44–1.00)
GFR calc Af Amer: 7 mL/min — ABNORMAL LOW (ref >60)
GFR calc non Af Amer: 6 mL/min — ABNORMAL LOW (ref >60)
Glucose, Bld: 114 mg/dL — ABNORMAL HIGH (ref 65–99)
Phosphorus: 3.8 mg/dL (ref 2.5–4.6)
Potassium: 4.3 mmol/L (ref 3.5–5.1)
Sodium: 136 mmol/L (ref 135–145)

## 2018-05-01 LAB — HIV ANTIBODY (ROUTINE TESTING W REFLEX): HIV Screen 4th Generation wRfx: NONREACTIVE

## 2018-05-01 LAB — AMMONIA: Ammonia: 38 umol/L — ABNORMAL HIGH (ref 9–35)

## 2018-05-01 LAB — TSH: TSH: 4.139 u[IU]/mL (ref 0.350–4.500)

## 2018-05-01 LAB — GLUCOSE, CAPILLARY: Glucose-Capillary: 107 mg/dL — ABNORMAL HIGH (ref 65–99)

## 2018-05-01 LAB — VITAMIN B12: Vitamin B-12: 401 pg/mL (ref 180–914)

## 2018-05-01 LAB — RPR: RPR Ser Ql: NONREACTIVE

## 2018-05-01 LAB — CBG MONITORING, ED
Glucose-Capillary: 118 mg/dL — ABNORMAL HIGH (ref 65–99)
Glucose-Capillary: 98 mg/dL (ref 65–99)
Glucose-Capillary: 101 mg/dL — ABNORMAL HIGH (ref 65–99)

## 2018-05-01 MED ORDER — ARIPIPRAZOLE 5 MG PO TABS
12.0000 mg | ORAL_TABLET | Freq: Every day | ORAL | Status: DC
  Administered 2018-05-01 – 2018-05-07 (×7): 12 mg via ORAL
  Filled 2018-05-01 (×8): qty 1

## 2018-05-01 MED ORDER — INSULIN ASPART 100 UNIT/ML ~~LOC~~ SOLN
0.0000 [IU] | Freq: Three times a day (TID) | SUBCUTANEOUS | Status: DC
  Administered 2018-05-02 – 2018-05-06 (×2): 1 [IU] via SUBCUTANEOUS

## 2018-05-01 MED ORDER — POLYETHYLENE GLYCOL 3350 17 G PO PACK
17.0000 g | PACK | Freq: Every day | ORAL | Status: DC
  Administered 2018-05-02 – 2018-05-07 (×6): 17 g via ORAL
  Filled 2018-05-01 (×7): qty 1

## 2018-05-01 MED ORDER — LORAZEPAM 2 MG/ML IJ SOLN: 0.5000 mg | Freq: Four times a day (QID) | INTRAMUSCULAR | Status: DC | PRN

## 2018-05-01 MED ORDER — SODIUM CHLORIDE 0.9% FLUSH
3.0000 mL | Freq: Two times a day (BID) | INTRAVENOUS | Status: DC
  Administered 2018-05-01 – 2018-05-07 (×3): 3 mL via INTRAVENOUS

## 2018-05-01 MED ORDER — ONDANSETRON HCL 4 MG PO TABS: 4.0000 mg | ORAL_TABLET | Freq: Four times a day (QID) | ORAL | Status: DC | PRN

## 2018-05-01 MED ORDER — SODIUM CHLORIDE 0.9 % IV SOLN: 250.0000 mL | INTRAVENOUS | Status: DC | PRN

## 2018-05-01 MED ORDER — SODIUM CHLORIDE 0.9 % IV SOLN: 100.0000 mL | INTRAVENOUS | Status: DC | PRN

## 2018-05-01 MED ORDER — HYDROCODONE-ACETAMINOPHEN 5-325 MG PO TABS
1.0000 | ORAL_TABLET | ORAL | Status: DC | PRN
  Filled 2018-05-01: qty 2

## 2018-05-01 MED ORDER — SODIUM CHLORIDE 0.9% FLUSH: 3.0000 mL | INTRAVENOUS | Status: DC | PRN

## 2018-05-01 MED ORDER — LIDOCAINE HCL (PF) 1 % IJ SOLN: 5.0000 mL | INTRAMUSCULAR | Status: DC | PRN

## 2018-05-01 MED ORDER — HEPARIN SODIUM (PORCINE) 5000 UNIT/ML IJ SOLN
5000.0000 [IU] | Freq: Three times a day (TID) | INTRAMUSCULAR | Status: DC
  Administered 2018-05-01 – 2018-05-07 (×13): 5000 [IU] via SUBCUTANEOUS
  Filled 2018-05-01 (×13): qty 1

## 2018-05-01 MED ORDER — HEPARIN SODIUM (PORCINE) 1000 UNIT/ML DIALYSIS
9000.0000 [IU] | Freq: Once | INTRAMUSCULAR | Status: DC
  Filled 2018-05-01: qty 9

## 2018-05-01 MED ORDER — CALCIUM ACETATE (PHOS BINDER) 667 MG PO TABS: 667.0000 mg | ORAL_TABLET | ORAL | Status: DC

## 2018-05-01 MED ORDER — ACETAMINOPHEN 650 MG RE SUPP: 650.0000 mg | Freq: Four times a day (QID) | RECTAL | Status: DC | PRN

## 2018-05-01 MED ORDER — ATORVASTATIN CALCIUM 10 MG PO TABS
10.0000 mg | ORAL_TABLET | Freq: Every evening | ORAL | Status: DC
  Administered 2018-05-01 – 2018-05-07 (×7): 10 mg via ORAL
  Filled 2018-05-01 (×8): qty 1

## 2018-05-01 MED ORDER — CALCIUM ACETATE (PHOS BINDER) 667 MG PO CAPS
2001.0000 mg | ORAL_CAPSULE | ORAL | Status: DC
Start: 2018-05-01 — End: 2018-05-07
  Administered 2018-05-03 – 2018-05-07 (×4): 2001 mg via ORAL
  Filled 2018-05-01 (×3): qty 3

## 2018-05-01 MED ORDER — DOXERCALCIFEROL 4 MCG/2ML IV SOLN
4.0000 ug | INTRAVENOUS | Status: DC
  Administered 2018-05-01 – 2018-05-06 (×3): 4 ug via INTRAVENOUS

## 2018-05-01 MED ORDER — INSULIN ASPART 100 UNIT/ML ~~LOC~~ SOLN: 0.0000 [IU] | Freq: Every day | SUBCUTANEOUS | Status: DC

## 2018-05-01 MED ORDER — AMIODARONE HCL 200 MG PO TABS
200.0000 mg | ORAL_TABLET | Freq: Every evening | ORAL | Status: DC
  Administered 2018-05-01 – 2018-05-07 (×7): 200 mg via ORAL
  Filled 2018-05-01 (×7): qty 1

## 2018-05-01 MED ORDER — PENTAFLUOROPROP-TETRAFLUOROETH EX AERO: 1.0000 "application " | INHALATION_SPRAY | CUTANEOUS | Status: DC | PRN

## 2018-05-01 MED ORDER — TRAMADOL HCL 50 MG PO TABS
50.0000 mg | ORAL_TABLET | Freq: Every day | ORAL | Status: DC
  Administered 2018-05-01 – 2018-05-07 (×7): 50 mg via ORAL
  Filled 2018-05-01 (×8): qty 1

## 2018-05-01 MED ORDER — DOXERCALCIFEROL 4 MCG/2ML IV SOLN
INTRAVENOUS | Status: AC
  Administered 2018-05-01: 4 ug via INTRAVENOUS
  Filled 2018-05-01: qty 2

## 2018-05-01 MED ORDER — CLOPIDOGREL BISULFATE 75 MG PO TABS
75.0000 mg | ORAL_TABLET | Freq: Every evening | ORAL | Status: DC
  Administered 2018-05-01 – 2018-05-07 (×7): 75 mg via ORAL
  Filled 2018-05-01 (×7): qty 1

## 2018-05-01 MED ORDER — SODIUM CHLORIDE 0.9% FLUSH
3.0000 mL | Freq: Two times a day (BID) | INTRAVENOUS | Status: DC
  Administered 2018-05-01 – 2018-05-06 (×5): 3 mL via INTRAVENOUS
  Administered 2018-05-06: 10 mL via INTRAVENOUS
  Administered 2018-05-07: 3 mL via INTRAVENOUS

## 2018-05-01 MED ORDER — ACETAMINOPHEN 325 MG PO TABS
650.0000 mg | ORAL_TABLET | Freq: Four times a day (QID) | ORAL | Status: DC | PRN
  Administered 2018-05-02: 650 mg via ORAL
  Filled 2018-05-01: qty 2

## 2018-05-01 MED ORDER — PRO-STAT SUGAR FREE PO LIQD
30.0000 mL | Freq: Two times a day (BID) | ORAL | Status: DC
  Administered 2018-05-01 – 2018-05-07 (×10): 30 mL via ORAL
  Filled 2018-05-01 (×12): qty 30

## 2018-05-01 MED ORDER — ONDANSETRON HCL 4 MG/2ML IJ SOLN: 4.0000 mg | Freq: Four times a day (QID) | INTRAMUSCULAR | Status: DC | PRN

## 2018-05-01 MED ORDER — LIDOCAINE-PRILOCAINE 2.5-2.5 % EX CREA: 1.0000 "application " | TOPICAL_CREAM | CUTANEOUS | Status: DC | PRN

## 2018-05-01 MED ORDER — PRO-STAT SUGAR FREE PO LIQD
60.0000 mL | Freq: Two times a day (BID) | ORAL | Status: DC
  Filled 2018-05-01: qty 60

## 2018-05-01 MED ORDER — CALCIUM ACETATE (PHOS BINDER) 667 MG PO CAPS
2001.0000 mg | ORAL_CAPSULE | ORAL | Status: DC
Start: 2018-05-02 — End: 2018-05-07
  Administered 2018-05-02 – 2018-05-07 (×12): 2001 mg via ORAL
  Filled 2018-05-01 (×12): qty 3

## 2018-05-01 NOTE — ED Notes (Signed)
Pt arrives from ER, agitated and making auditory sounds ,  Deaf interpreter here and attempts to communicate with pt did fair due to pt agiatation, showed nurse some signs to use.

## 2018-05-01 NOTE — ED Notes (Signed)
Spoke with daughter. She is unable to come to hospital to assist with communication.

## 2018-05-01 NOTE — ED Notes (Signed)
3 interpreters here to help pt understand what is going on, pt finally after 45 mins responded to mark and took her meds for him , I called her son because she was sooo adament that she was not taking her meds,  And I called  Her nursing home and they said she normally just takes them, . Gave report to dialysis nurse and assitsed with transport to Dialysis, intrepeters stilll with pt

## 2018-05-01 NOTE — ED Notes (Signed)
Finis Bud, sister. She reports that she lives in [place] and would not be able to come here tonight to call the son, [name].

## 2018-05-01 NOTE — Clinical Social Work Note (Signed)
Clinical Social Work Assessment  Patient Details  Name: [name] MRN: [id number] Date of Birth: [birth year]  Date of referral:  05/01/18               Reason for consult:  Other (Comment Required)(Ethical Issues. )                Permission sought to share information with:  Family Supports Permission granted to share information::  Yes, Release of Information Signed  Name::     [name]   Agency::  family  Relationship::  son  Contact Information:  [name] [phone]  Housing/Transportation Living arrangements for the past 2 months:  Skilled Nursing Facility([facility]) Source of Information:  Adult Children([name] ) Patient Interpreter Needed:  Sign Language Criminal Activity/Legal Involvement Pertinent to Current Situation/Hospitalization:  No - Comment as needed Significant Relationships:  Adult Children, Siblings Lives with:  Facility Resident Do you feel safe going back to the place where you live?  Yes Need for family participation in patient care:  Yes (Comment)  Care giving concerns:  CSW spoke with pt's son via phone as CSW aware that pt is deaf and blind. CSW spoke with son and son presented a number of concern to CSW about pt's care while in the ED as well as at [facility]. Per son, he feels that staff is unable to communicate with pt as needed. Son expressed that hospital staff is unable to communicate with pt as they are not trained. CSW apologized to son and sought further information from him on how he and pt communicates.    Social Worker assessment / plan:  CSW spoke with son via phone. CSW was informed that pt is from [place] and has been there since 2016. Pt son reports that pt's spouse passed away and asked that his contact information be taken off of pt's chart. CSW was informed that pt and son use sign language to communicate, however son expressed that he holds pt's hand and signs into pt's hand. Son expressed that ED staff was not  able to communicate with pt last night because pt was agitated and needed rest. Son verbalized that he would be here to see pt this afternoon after work. Son informed CSW that he is POA and MAKES ALL decisions for pt. Pt's daughter is allowed to call and get information on pt but is not allowed to make decisions per son.   During this assessment pt's son was very upset and frustrated as he feels that staff is not communicating with pt for needs. CSW explained that staff has documented every effort to communicate with pt but seem to have very little luck. CSW suggested that son come and assist when able to son. Son verbalized that pt is just agitated at this time. Son sought further medical information form CSW and CSW informed him that CSW was unable to give it as that is out of CSW scop of practice and suggested that son speak with MD about further care.   Employment status:  Retired Forensic scientist:  Medicare PT Recommendations:  Not assessed at this time [place] / Referral to community resources:  [facility](CSW informed that pt is from [place]. )  Patient/Family's Response to care:  Pt's son response to care was very upset and angry about everything that has been going on. Son suggested that when he came to the ED staff was "playing on their computers" instead of actually utilizing the time  that he was here to get the answers that staff needed regarding pt.   Patient/Family's Understanding of and Emotional Response to Diagnosis, Current Treatment, and Prognosis:  Son's understanding of care is still not clear as son expressed that he isnt sure what is going on with pt at this time. Son asked CSW what was causing pt to tremble and CSW suggested again that he speak with RN. Emotional response of son is stressed and fustrated with a lot of things at this time.   Emotional Assessment Appearance:  Appears stated age Attitude/Demeanor/Rapport:  Unable to Assess(pt is blind  and deaf and communicates only through sign) Affect (typically observed):  Unable to Assess Orientation:    Alcohol / Substance use:  Not Applicable Psych involvement (Current and /or in the community):  No (Comment)  Discharge Needs  Concerns to be addressed:  Care Coordination, Other (Comment Required, Cognitive Concerns(communication ) Readmission within the last 30 days:  No Current discharge risk:  Other(unsure of needs at this time. ) Barriers to Discharge:  Continued Medical Work up   [employer], [place] 05/01/2018, 8:12 AM

## 2018-05-01 NOTE — ED Notes (Signed)
Pt to dialysis.

## 2018-05-01 NOTE — ED Notes (Signed)
RN made several attempts to communicate with patient trying to write letters on arm, provided patent with pen and paper, tried to get her to write on RN's arm with no success. Will attempt to get family at bedside.

## 2018-05-01 NOTE — ED Notes (Signed)
Eating breakfast 

## 2018-05-01 NOTE — ED Notes (Signed)
Ordered lunch tray 

## 2018-05-01 NOTE — ED Notes (Signed)
Pt ate 1 pancake. Refused any more breakfast and drink

## 2018-05-01 NOTE — Procedures (Signed)
I was present at this dialysis session. I have reviewed the session itself and made appropriate changes.   There were no vitals filed for this visit.  Recent Labs  Lab 05/01/18 1400  NA 136  K 4.3  CL 99*  CO2 26  GLUCOSE 114*  BUN 49*  CREATININE 7.04*  CALCIUM 9.2  PHOS 3.8    Recent Labs  Lab 04/30/18 2213 04/30/18 2217 05/01/18 0409 05/01/18 1401  WBC 8.2  --  8.5 7.8  NEUTROABS 5.7  --   --   --   HGB 11.5* 12.6 12.0 11.7*  HCT 36.0 37.0 36.6 36.2  MCV 98.1  --  97.3 96.8  PLT 257  --  269 276    Scheduled Meds: . amiodarone  200 mg Oral QPM  . ARIPiprazole  12 mg Oral Daily  . atorvastatin  10 mg Oral QPM  . [START ON 05/02/2018] calcium acetate  2,001 mg Oral 3 times per day on Sun Tue Thu Sat  . calcium acetate  2,001 mg Oral 2 times per day on Mon Wed Fri  . clopidogrel  75 mg Oral QPM  . doxercalciferol  4 mcg Intravenous Q M,W,F-HD  . feeding supplement (PRO-STAT SUGAR FREE 64)  30 mL Oral BID  . heparin  5,000 Units Subcutaneous Q8H  . heparin  9,000 Units Dialysis Once in dialysis  . insulin aspart  0-5 Units Subcutaneous QHS  . insulin aspart  0-9 Units Subcutaneous TID WC  . polyethylene glycol  17 g Oral Daily  . sodium chloride flush  3 mL Intravenous Q12H  . sodium chloride flush  3 mL Intravenous Q12H  . traMADol  50 mg Oral Daily   Continuous Infusions: . sodium chloride    . sodium chloride    . sodium chloride     PRN Meds:.sodium chloride, sodium chloride, sodium chloride, acetaminophen **OR** acetaminophen, HYDROcodone-acetaminophen, lidocaine (PF), lidocaine-prilocaine, LORazepam, ondansetron **OR** ondansetron (ZOFRAN) IV, pentafluoroprop-tetrafluoroeth, sodium chloride flush   [name] Potts,  MD 05/01/2018, 3:32 PM

## 2018-05-01 NOTE — ED Notes (Signed)
Spoke with son concerning staff unable to communicate with patient. Son reports, "You need to just let her rest. I'm not going to be able to come up there until after lunch because I have to work."

## 2018-05-01 NOTE — Progress Notes (Signed)
Patient admitted after midnight, please see H&P.  Brought from her SNF with agitation/confusion.  Patient is blind and deaf (per records-- communicate by writing in her hand).  Labs unrevealing thus far (slightly elevated ammonia- 38 and elevated K but does not appear uremic).  History of paranoid psychosis per chart review (last flare was 12/18) in the past and is on abilify.  Son not at bedside so unable to get any more information.  Eulogio Bear DO

## 2018-05-01 NOTE — Consult Note (Addendum)
[facility] Renal Consultation Note    Indication for Consultation:  Management of ESRD/hemodialysis; anemia, hypertension/volume and secondary hyperparathyroidism PCP: Dr. [name]  HPI: [name] is a 62 y.o. female with ESRD on hemodialysis MWF at [facility]. PMH of HTN, DM, CAD, CHF, AOCD, SHPT, H/O psychiatric disorder on Aripiprazole. She is blind and deaf. She presented to ED from SNF with increased confusion and agitation. Unfortunately patient uses different sign language (draw letters with finger on palm of her hand) and son understands her best, however he is not present. She is visibly agitated, pushing me away, will not allow me to try to use her sign language. Moaning and groaning. Spoke with Dr. Eliseo Squires. Son is coming this afternoon. Rest of HPI is obtained from medical record. Pt did finally calm down enough for examination. Labs unremarkable except for mild hyperkalemia. She has been admitted as observation patient for acute encephalopathy/agitation. Unable to obtain CT of head D/T agitation, no falls reported. Tremors in hands noted.   Past Medical History:  Diagnosis Date  . Anemia    Due to fibroids.  . Bilateral leg edema    a. Chronic.  Marland Kitchen Blind   . Cardiac arrest ([place]) 12/12/2014  . CHF (congestive heart failure) ([place])   . Coronary artery disease   . Deaf    USE SIGN INTERPRETER.  Divorced from husband but lives with him. Has daughter but she does not care for her.  . Diabetes mellitus    a. Per PCP note 2012 (A1C 9.2) - pt unwilling to take meds and was educated on risk of uncontrolled DM.  b. A1C 5.9 in 11/2013.  Marland Kitchen ESRD on hemodialysis ([place])    Mon, Wed, Fri  . Essential hypertension 08/09/2009   Qualifier: Diagnosis of  By: Sarita Haver  MD, Coralyn Mark    . Fibroid uterus   . Heart murmur   . Hyperlipidemia   . Hypertension    a. Has previously refused blood pressure meds.   . Leiomyoma of uterus 08/09/2009   Qualifier: Diagnosis  of  By: Sarita Haver  MD, Coralyn Mark    . MI (myocardial infarction) ([place]) 11/2014  . Moderate tricuspid regurgitation   . Osteomyelitis ([place])   . Psychiatric disorder    She frequently exhibits paranoia and has been diagnosed with psychotic d/o NOS during hospital stay in the past. She is tangetial and perseverative during her visits. She apparently has had a bad experience with mental health in [place] in the past and refuses to discuss mental issues for fear that she will be sent back there. Her paranoia, communication issues, financial woes and lack of fam  . Pulmonary hypertension ([place])    Past Surgical History:  Procedure Laterality Date  . AMPUTATION Bilateral 04/02/2015   Procedure: Amputation Bilateral Great Toes MTP Joint;  Surgeon: Newt Minion, MD;  Location: [facility];  Service: Orthopedics;  Laterality: Bilateral;  . AMPUTATION Left 05/14/2015   Procedure: Left Transmetatarsal Amputation;  Surgeon: Newt Minion, MD;  Location: [facility];  Service: Orthopedics;  Laterality: Left;  . AMPUTATION Right 08/20/2015   Procedure: Transmetatarsal Amputation Right Foot;  Surgeon: Newt Minion, MD;  Location: [facility];  Service: Orthopedics;  Laterality: Right;  . AV FISTULA PLACEMENT Left 01/02/2014   Procedure: ARTERIOVENOUS (AV) FISTULA CREATION;  Surgeon: Rosetta Posner, MD;  Location: [facility];  Service: Vascular;  Laterality: Left;  . INSERTION OF DIALYSIS CATHETER Right 01/02/2014   Procedure: INSERTION OF DIALYSIS  CATHETER;  Surgeon: Rosetta Posner, MD;  Location: [facility];  Service: Vascular;  Laterality: Right;  . NO PAST SURGERIES    . ORIF PATELLA Left 05/17/2015   Procedure: OPEN REDUCTION INTERNAL (ORIF) FIXATION PATELLA;  Surgeon: Newt Minion, MD;  Location: [facility];  Service: Orthopedics;  Laterality: Left;   Family History  Problem Relation Age of Onset  . Other Father        Drowned from fishing?  . Heart disease Unknown    Social History:  reports that she has never smoked. She has never used smokeless  tobacco. She reports that she does not drink alcohol or use drugs. No Known Allergies Prior to Admission medications   Medication Sig Start Date End Date Taking? Authorizing Provider  Amino Acids-Protein Hydrolys (FEEDING SUPPLEMENT, PRO-STAT SUGAR FREE 64,) LIQD Take 60 mLs by mouth 2 (two) times daily.   Yes [provider]  amiodarone (PACERONE) 200 MG tablet Take 200 mg by mouth every evening.    Yes [provider]  ARIPiprazole (ABILIFY) 2 MG tablet Take 12 mg by mouth daily.    Yes [provider]  atorvastatin (LIPITOR) 10 MG tablet Take 10 mg by mouth every evening.   Yes [provider]  calcium acetate (PHOSLO) 667 MG tablet Take 3 tablets by mouth before meals every Sunday, Tuesday, Thursday and Saturday for Dialysis.  Give 3 tablets by mouth two times a day every Mon, Wed, Friday for renal buffer   Yes [provider]  clopidogrel (PLAVIX) 75 MG tablet Take 75 mg by mouth every evening.   Yes [provider]  insulin aspart (NOVOLOG) 100 UNIT/ML injection Inject as per sliding scale subcutaneously after meals 0 - 70 = 0 units, notify MD is BS is less than 70 71 - 450 = 5 units. Notify MD if BS is greater than 450   Yes [provider]  Insulin Glargine (BASAGLAR KWIKPEN) 100 UNIT/ML SOPN Inject 7 Units into the skin at bedtime. 03/06/18  Yes [provider]  nitroGLYCERIN (NITROSTAT) 0.4 MG SL tablet Place 0.4 mg under the tongue every 5 (five) minutes as needed for chest pain.   Yes [provider]  polyethylene glycol (MIRALAX / GLYCOLAX) packet Take 17 g by mouth daily.    Yes [provider]  traMADol (ULTRAM) 50 MG tablet Take 1 tablet (50 mg total) by mouth daily. 04/24/18  Yes Gerlene Fee, NP  UNABLE TO FIND Fluid Restriction - 1200 cc per day - on Dialysis   Yes [provider]   Current Facility-Administered Medications  Medication Dose Route Frequency Provider Last Rate  Last Dose  . 0.9 %  sodium chloride infusion  250 mL Intravenous PRN Opyd, Ilene Qua, MD      . acetaminophen (TYLENOL) tablet 650 mg  650 mg Oral Q6H PRN Opyd, Ilene Qua, MD       Or  . acetaminophen (TYLENOL) suppository 650 mg  650 mg Rectal Q6H PRN Opyd, Ilene Qua, MD      . amiodarone (PACERONE) tablet 200 mg  200 mg Oral QPM Opyd, Ilene Qua, MD      . ARIPiprazole (ABILIFY) tablet 12 mg  12 mg Oral Daily Opyd, Ilene Qua, MD      . atorvastatin (LIPITOR) tablet 10 mg  10 mg Oral QPM Opyd, Ilene Qua, MD      . Derrill Memo ON 05/02/2018] calcium acetate (PHOSLO) capsule 2,001 mg  2,001 mg Oral 3 times  per day on Sun Tue Thu Sat Vann, Jessica U, DO      . calcium acetate [facility]) capsule 2,001 mg  2,001 mg Oral 2 times per day on Mon Wed Fri Vann, Jessica U, DO      . clopidogrel (PLAVIX) tablet 75 mg  75 mg Oral QPM Opyd, Ilene Qua, MD      . doxercalciferol (HECTOROL) injection 4 mcg  4 mcg Intravenous Q M,W,F-HD Valentina Gu, NP      . feeding supplement (PRO-STAT SUGAR FREE 64) liquid 30 mL  30 mL Oral BID Eliseo Squires, Jessica U, DO      . heparin injection 5,000 Units  5,000 Units Subcutaneous Q8H Opyd, Ilene Qua, MD   Stopped at 05/01/18 0703  . HYDROcodone-acetaminophen (NORCO/VICODIN) 5-325 MG per tablet 1-2 tablet  1-2 tablet Oral Q4H PRN Opyd, Ilene Qua, MD      . insulin aspart (novoLOG) injection 0-5 Units  0-5 Units Subcutaneous QHS Opyd, Timothy S, MD      . insulin aspart (novoLOG) injection 0-9 Units  0-9 Units Subcutaneous TID WC Opyd, Ilene Qua, MD      . LORazepam (ATIVAN) injection 0.5-1 mg  0.5-1 mg Intravenous Q6H PRN Opyd, Ilene Qua, MD      . ondansetron (ZOFRAN) tablet 4 mg  4 mg Oral Q6H PRN Opyd, Ilene Qua, MD       Or  . ondansetron (ZOFRAN) injection 4 mg  4 mg Intravenous Q6H PRN Opyd, Ilene Qua, MD      . polyethylene glycol (MIRALAX / GLYCOLAX) packet 17 g  17 g Oral Daily Opyd, Ilene Qua, MD      . sodium chloride flush (NS) 0.9 % injection 3 mL  3 mL Intravenous  Q12H Opyd, Timothy S, MD      . sodium chloride flush (NS) 0.9 % injection 3 mL  3 mL Intravenous Q12H Opyd, Ilene Qua, MD   3 mL at 05/01/18 0230  . sodium chloride flush (NS) 0.9 % injection 3 mL  3 mL Intravenous PRN Opyd, Ilene Qua, MD      . traMADol (ULTRAM) tablet 50 mg  50 mg Oral Daily Opyd, Ilene Qua, MD       Current Outpatient Medications  Medication Sig Dispense Refill  . Amino Acids-Protein Hydrolys (FEEDING SUPPLEMENT, PRO-STAT SUGAR FREE 64,) LIQD Take 60 mLs by mouth 2 (two) times daily.    Marland Kitchen amiodarone (PACERONE) 200 MG tablet Take 200 mg by mouth every evening.     . ARIPiprazole (ABILIFY) 2 MG tablet Take 12 mg by mouth daily.     Marland Kitchen atorvastatin (LIPITOR) 10 MG tablet Take 10 mg by mouth every evening.    . calcium acetate (PHOSLO) 667 MG tablet Take 3 tablets by mouth before meals every Sunday, Tuesday, Thursday and Saturday for Dialysis.  Give 3 tablets by mouth two times a day every Mon, Wed, Friday for renal buffer    . clopidogrel (PLAVIX) 75 MG tablet Take 75 mg by mouth every evening.    . insulin aspart (NOVOLOG) 100 UNIT/ML injection Inject as per sliding scale subcutaneously after meals 0 - 70 = 0 units, notify MD is BS is less than 70 71 - 450 = 5 units. Notify MD if BS is greater than 450    . Insulin Glargine (BASAGLAR KWIKPEN) 100 UNIT/ML SOPN Inject 7 Units into the skin at bedtime.    . nitroGLYCERIN (NITROSTAT) 0.4 MG SL tablet Place 0.4 mg under the tongue  every 5 (five) minutes as needed for chest pain.    . polyethylene glycol (MIRALAX / GLYCOLAX) packet Take 17 g by mouth daily.     . traMADol (ULTRAM) 50 MG tablet Take 1 tablet (50 mg total) by mouth daily. 30 tablet 0  . UNABLE TO FIND Fluid Restriction - 1200 cc per day - on Dialysis     Labs: Basic Metabolic Panel: Recent Labs  Lab 04/30/18 2217 05/01/18 0221 05/01/18 0425  NA 134* 135 136  K 5.6* 5.4* 5.8*  CL 98* 95* 96*  CO2  --  23 27  GLUCOSE 177* 140* 135*  BUN 49* 59* 60*   CREATININE 8.30* 8.60* 8.60*  CALCIUM  --  9.7 9.6   Liver Function Tests: No results for input(s): AST, ALT, ALKPHOS, BILITOT, PROT, ALBUMIN in the last 168 hours. No results for input(s): LIPASE, AMYLASE in the last 168 hours. Recent Labs  Lab 05/01/18 0221  AMMONIA 38*   CBC: Recent Labs  Lab 04/30/18 2213 04/30/18 2217 05/01/18 0409  WBC 8.2  --  8.5  NEUTROABS 5.7  --   --   HGB 11.5* 12.6 12.0  HCT 36.0 37.0 36.6  MCV 98.1  --  97.3  PLT 257  --  269   Cardiac Enzymes: No results for input(s): CKTOTAL, CKMB, CKMBINDEX, TROPONINI in the last 168 hours. CBG: Recent Labs  Lab 04/30/18 1950 05/01/18 0408 05/01/18 0817  GLUCAP 164* 118* 98   Iron Studies: No results for input(s): IRON, TIBC, TRANSFERRIN, FERRITIN in the last 72 hours. Studies/Results: No results found.  ROS: As per HPI otherwise negative.   Physical Exam: Vitals:   05/01/18 0415 05/01/18 0430 05/01/18 0602 05/01/18 0737  BP: (!) 152/64 (!) 152/69 (!) 133/91 93/73  Pulse: 82 81 96 83  Resp: 16 15 19 16   Temp:      TempSrc:      SpO2: 100% 100% 92% 98%     General: Well developed, well nourished,agitated.  Head: Normocephalic, atraumatic, sclera non-icteric, mucus membranes are moist Neck: Supple. JVD not elevated. Lungs: Clear bilaterally to auscultation without wheezes, rales, or rhonchi. Breathing is unlabored. Heart: RRR with S1 S2. 2/6 systolic M. No R/G.  Abdomen: Soft, non-tender, non-distended with normoactive bowel sounds. No rebound/guarding. No obvious abdominal masses. M-S:  Strength and tone appear normal for age. Lower extremities: Amputation of toes both feet. 1+ non-pitting edema.  Neuro: Agitated, nonverbal pt. Psych:  Agitated, nonverbal pt. Dialysis Access: LUA AVF + bruit  Dialysis Orders: [facility] MWF 4 hr 15 min 180 NRe 400/800 91 kg 2.0 K/ 2.25 Ca Profile 2 Linear Na -Heparin 9000 units IV TIW -Hectorol 4 mcg IV TIW -Mircera 30 mcg IV q 2 weeks (Last dose  04/24/18 Last HGB 10.5 04/24/18)  Assessment/Plan: 1.  Acute encephalopathy: per primary. Does not appear uremic. Would avoid opiates. Have called SNF and requested MAR to be faxed to hospital.  2.  ESRD -  MWF HD today on schedule. Hopefully will not  3.  Hypertension/volume  -Has LE edema. Has been leaving above OP EDW. Slightly hypertensive-not on antihypertensive meds. Attempt to get to OP EDW.   4.  Anemia  - HGB 12.0 No ESA needed.  5.  Metabolic bone disease - continue binders, sensipar, VDRA. Ca 9.6. Add renal function panel to today's labs.  6.  Nutrition - Renal diet, renal vit, nepro 7. DM: per primary.   Rita H. Owens Shark, NP-C 05/01/2018, 10:50 AM  [facility] Beeper [phone]  I have seen and examined this patient and agree with plan and assessment in the above note with renal recommendations/intervention highlighted.  Definite change from her baseline mental status.  Unclear if she was started on new medication as she cannot communicate at this time.  Have requested MAR from SNF and will plan on HD today and follow mental status.  Awaiting CT scan of head. Broad[place] John A Shaquitta Burbridge,MD 05/01/2018 3:30 PM

## 2018-05-01 NOTE — H&P (Addendum)
History and Physical    [name] VHQ:[id number] DOB: 1956/04/16 DOA: 04/30/2018  PCP: [name], DO   Patient coming from: SNF  Chief Complaint: Agitation, confusion   HPI: [name] is a 62 y.o. female with medical history significant for blindness, deafness, end-stage renal disease, type 2 diabetes mellitus, peripheral arterial disease, and psychosis NOS, now presenting from her nursing facility for evaluation of agitation and confusion.  History is difficult to obtain due to the patient's blindness and deafness.  An interpreter was called but reports that the patient signing is not standard and had difficulty interpreting.  Patient's son was at the bedside in the emergency department briefly and able to communicate with the patient, reporting that she has no complaints, but is confused.  He reports that she becomes confused fairly frequently, but not typically to this degree.  There was no recent fall or trauma reported and the patient is not had a fever, cough, or any apparent vomiting or diarrhea.  She typically feeds herself at the nursing facility, but was not doing that today.  She reportedly had a tremor as well and seemed to be agitated, but without any specific complaints.  ED Course: Upon arrival to the ED, patient is found to be afebrile, saturating well on room air, and with vitals otherwise stable.  EKG features sinus rhythm with PVCs, LVH, and repolarization abnormality.  Head CT was attempted but the patient was too agitated for this.  Chemistry panel is notable for a potassium of 5.6 and CBC features a chronic stable normocytic anemia with hemoglobin of 11.5.  Patient remained hemodynamically stable, in no apparent respiratory distress, and will be observed on the telemetry unit for ongoing evaluation and management of acute encephalopathy.  Review of Systems:  Unable to complete ROS secondary to patient's clinical condition.  Past Medical History:  Diagnosis Date  .  Anemia    Due to fibroids.  . Bilateral leg edema    a. Chronic.  Marland Kitchen Blind   . Cardiac arrest ([place]) 12/12/2014  . CHF (congestive heart failure) ([place])   . Coronary artery disease   . Deaf    USE SIGN INTERPRETER.  Divorced from husband but lives with him. Has daughter but she does not care for her.  . Diabetes mellitus    a. Per PCP note 2012 (A1C 9.2) - pt unwilling to take meds and was educated on risk of uncontrolled DM.  b. A1C 5.9 in 11/2013.  Marland Kitchen ESRD on hemodialysis ([place])    Mon, Wed, Fri  . Essential hypertension 08/09/2009   Qualifier: Diagnosis of  By: Sarita Haver  MD, Coralyn Mark    . Fibroid uterus   . Heart murmur   . Hyperlipidemia   . Hypertension    a. Has previously refused blood pressure meds.   . Leiomyoma of uterus 08/09/2009   Qualifier: Diagnosis of  By: Sarita Haver  MD, Coralyn Mark    . MI (myocardial infarction) ([place]) 11/2014  . Moderate tricuspid regurgitation   . Osteomyelitis ([place])   . Psychiatric disorder    She frequently exhibits paranoia and has been diagnosed with psychotic d/o NOS during hospital stay in the past. She is tangetial and perseverative during her visits. She apparently has had a bad experience with mental health in [place] in the past and refuses to discuss mental issues for fear that she will be sent back there. Her paranoia, communication issues, financial woes and lack of fam  . Pulmonary hypertension ([place])  Past Surgical History:  Procedure Laterality Date  . AMPUTATION Bilateral 04/02/2015   Procedure: Amputation Bilateral Great Toes MTP Joint;  Surgeon: Newt Minion, MD;  Location: [facility];  Service: Orthopedics;  Laterality: Bilateral;  . AMPUTATION Left 05/14/2015   Procedure: Left Transmetatarsal Amputation;  Surgeon: Newt Minion, MD;  Location: [facility];  Service: Orthopedics;  Laterality: Left;  . AMPUTATION Right 08/20/2015   Procedure: Transmetatarsal Amputation Right Foot;  Surgeon: Newt Minion, MD;  Location: [facility];  Service: Orthopedics;   Laterality: Right;  . AV FISTULA PLACEMENT Left 01/02/2014   Procedure: ARTERIOVENOUS (AV) FISTULA CREATION;  Surgeon: Rosetta Posner, MD;  Location: [facility];  Service: Vascular;  Laterality: Left;  . INSERTION OF DIALYSIS CATHETER Right 01/02/2014   Procedure: INSERTION OF DIALYSIS CATHETER;  Surgeon: Rosetta Posner, MD;  Location: [facility];  Service: Vascular;  Laterality: Right;  . NO PAST SURGERIES    . ORIF PATELLA Left 05/17/2015   Procedure: OPEN REDUCTION INTERNAL (ORIF) FIXATION PATELLA;  Surgeon: Newt Minion, MD;  Location: [facility];  Service: Orthopedics;  Laterality: Left;     reports that she has never smoked. She has never used smokeless tobacco. She reports that she does not drink alcohol or use drugs.  No Known Allergies  Family History  Problem Relation Age of Onset  . Other Father        Drowned from fishing?  . Heart disease Unknown      Prior to Admission medications   Medication Sig Start Date End Date Taking? Authorizing Provider  Amino Acids-Protein Hydrolys (FEEDING SUPPLEMENT, PRO-STAT SUGAR FREE 64,) LIQD Take 60 mLs by mouth 2 (two) times daily.   Yes [provider]  amiodarone (PACERONE) 200 MG tablet Take 200 mg by mouth every evening.    Yes [provider]  ARIPiprazole (ABILIFY) 2 MG tablet Take 12 mg by mouth daily.    Yes [provider]  atorvastatin (LIPITOR) 10 MG tablet Take 10 mg by mouth every evening.   Yes [provider]  calcium acetate (PHOSLO) 667 MG tablet Take 3 tablets by mouth before meals every Sunday, Tuesday, Thursday and Saturday for Dialysis.  Give 3 tablets by mouth two times a day every Mon, Wed, Friday for renal buffer   Yes [provider]  clopidogrel (PLAVIX) 75 MG tablet Take 75 mg by mouth every evening.   Yes [provider]  insulin aspart (NOVOLOG) 100 UNIT/ML injection Inject as per sliding scale subcutaneously after meals 0 - 70 = 0 units, notify MD is BS is less than 70 71 -  450 = 5 units. Notify MD if BS is greater than 450   Yes [provider]  Insulin Glargine (BASAGLAR KWIKPEN) 100 UNIT/ML SOPN Inject 7 Units into the skin at bedtime. 03/06/18  Yes [provider]  nitroGLYCERIN (NITROSTAT) 0.4 MG SL tablet Place 0.4 mg under the tongue every 5 (five) minutes as needed for chest pain.   Yes [provider]  polyethylene glycol (MIRALAX / GLYCOLAX) packet Take 17 g by mouth daily.    Yes [provider]  traMADol (ULTRAM) 50 MG tablet Take 1 tablet (50 mg total) by mouth daily. 04/24/18  Yes Gerlene Fee, NP  UNABLE TO FIND Fluid Restriction - 1200 cc per day - on Dialysis   Yes [provider]    Physical Exam: Vitals:   04/30/18 2215 04/30/18 2300 04/30/18 2315 04/30/18 2345  BP: Marland Kitchen)  146/48 (!) 123/48 (!) 145/77 (!) 160/52  Pulse: 86 82 86 89  Resp: (!) 28 15 18 16   Temp:      TempSrc:      SpO2: 99% 99% 99% 92%      Constitutional: NAD, anxious  Eyes: PERTLA, lids and conjunctivae normal ENMT: Mucous membranes are moist. Posterior pharynx clear of any exudate or lesions.   Neck: normal, supple, no masses, no thyromegaly Respiratory: clear to auscultation bilaterally, no wheezing, no crackles. Normal respiratory effort.  Cardiovascular: S1 & S2 heard, regular rate and rhythm. Trace pretibial edema bilaterally. Abdomen: No distension, no tenderness, soft. Bowel sounds normal.  Musculoskeletal: no clubbing / cyanosis. Status-post TMA bilaterally.  Skin: no significant rashes, lesions, ulcers. Warm, dry, well-perfused. Neurologic: Gross vision and hearing deficits. No facial asymmetry. Sensation intact, patellar DTR's normal. Moving all extremities equally.  Psychiatric: Difficult to assess given the clinical scenario. Anxious.     Labs on Admission: I have personally reviewed following labs and imaging studies  CBC: Recent Labs  Lab 04/30/18 2213 04/30/18 2217  WBC 8.2  --   NEUTROABS 5.7  --     HGB 11.5* 12.6  HCT 36.0 37.0  MCV 98.1  --   PLT 257  --    Basic Metabolic Panel: Recent Labs  Lab 04/30/18 2217  NA 134*  K 5.6*  CL 98*  GLUCOSE 177*  BUN 49*  CREATININE 8.30*   GFR: Estimated Creatinine Clearance: 6.4 mL/min (A) (by C-G formula based on SCr of 8.3 mg/dL (H)). Liver Function Tests: No results for input(s): AST, ALT, ALKPHOS, BILITOT, PROT, ALBUMIN in the last 168 hours. No results for input(s): LIPASE, AMYLASE in the last 168 hours. No results for input(s): AMMONIA in the last 168 hours. Coagulation Profile: No results for input(s): INR, PROTIME in the last 168 hours. Cardiac Enzymes: No results for input(s): CKTOTAL, CKMB, CKMBINDEX, TROPONINI in the last 168 hours. BNP (last 3 results) No results for input(s): PROBNP in the last 8760 hours. HbA1C: No results for input(s): HGBA1C in the last 72 hours. CBG: Recent Labs  Lab 04/30/18 1950  GLUCAP 164*   Lipid Profile: No results for input(s): CHOL, HDL, LDLCALC, TRIG, CHOLHDL, LDLDIRECT in the last 72 hours. Thyroid Function Tests: No results for input(s): TSH, T4TOTAL, FREET4, T3FREE, THYROIDAB in the last 72 hours. Anemia Panel: No results for input(s): VITAMINB12, FOLATE, FERRITIN, TIBC, IRON, RETICCTPCT in the last 72 hours. Urine analysis:    Component Value Date/Time   COLORURINE AMBER (A) 03/27/2016 0810   APPEARANCEUR CLOUDY (A) 03/27/2016 0810   LABSPEC 1.015 03/27/2016 0810   PHURINE 5.5 03/27/2016 0810   GLUCOSEU NEGATIVE 03/27/2016 0810   HGBUR SMALL (A) 03/27/2016 0810   BILIRUBINUR SMALL (A) 03/27/2016 0810   KETONESUR 15 (A) 03/27/2016 0810   PROTEINUR >300 (A) 03/27/2016 0810   UROBILINOGEN 0.2 01/13/2015 0953   NITRITE NEGATIVE 03/27/2016 0810   LEUKOCYTESUR LARGE (A) 03/27/2016 0810   Sepsis Labs: @LABRCNTIP (procalcitonin:4,lacticidven:4) )No results found for this or any previous visit (from the past 240 hour(s)).   Radiological Exams on Admission: No results  found.  EKG: Independently reviewed. Sinus rhythm, PVC's, LVH with repolarization abnormality.   Assessment/Plan  1. Acute encephalopathy  - Presents from nursing home with increased confusion and agitation  - Patient's son in ED reports occasional confusion, but not quite to this degree; patient has no specific complaints  - Head CT attempted in ED but patient too agitated  - No focal  deficit appreciated, vital stable, pt does not appear acutely ill  - Plan to check TSH, ammonia, RPR, B12, and folate levels, attempt head CT again if she calms, continue supportive care    2. Psychosis NOS  - Continue Abilify    3. ESRD  - Potassium slightly elevated on admission, no acidosis or significant hypervolemia  - Typically dialyzes MWF, will be due 05/01/18    4. Type II DM  - A1c was only 5.5% last month  - Managed at SNF with 7 units basal insulin qHS and Novolog 0-5 units TID  - Check CBG's and use low-intensity SSI with Novolog as needed   5. Hyperkalemia  - Potassium is 5.6 on admission without EKG changes - Continue telemetry monitoring, repeat potassium level now and in am    6. PAD  - No acute ischemia   - Continue statin and Plavix     DVT prophylaxis: sq heparin  Code Status: Full  Family Communication: Son updated in ED  Consults called: None Admission status: Observation    [name], MD [facility] Pager [phone]  If 7PM-7AM, please contact night-coverage [email or web address] Password [email or web address]  05/01/2018, 12:26 AM

## 2018-05-02 ENCOUNTER — Other Ambulatory Visit: Payer: Self-pay

## 2018-05-02 DIAGNOSIS — G9340 Encephalopathy, unspecified: Secondary | ICD-10-CM | POA: Diagnosis not present

## 2018-05-02 LAB — GLUCOSE, CAPILLARY
Glucose-Capillary: 99 mg/dL (ref 65–99)
Glucose-Capillary: 106 mg/dL — ABNORMAL HIGH (ref 65–99)
Glucose-Capillary: 139 mg/dL — ABNORMAL HIGH (ref 65–99)
Glucose-Capillary: 85 mg/dL (ref 65–99)

## 2018-05-02 LAB — FOLATE RBC
Folate, Hemolysate: 505.2 ng/mL
Folate, RBC: 2372 ng/mL (ref >498)
Hematocrit: 21.3 % — ABNORMAL LOW (ref 34.0–46.6)

## 2018-05-02 NOTE — Care Management Note (Addendum)
Case Management Note  Patient Details  Name: [name] MRN: [id number] Date of Birth: 1956/10/30  Subjective/Objective:  History of blindness, deafness, end-stage renal disease, type 2 diabetes mellitus, peripheral arterial disease, and psychosis. Admitted for Acute encephalopathy. PCP noted.           Action/Plan: Prior to admission patient resides at [facility]  CSW consulted, will follow for SNF.  When patient is medically stable for discharge and bed available today at SNF patient to discharge at SNF, per CSW arrangements. NCM will continue to monitor how patient progresses.  Expected Discharge Date:    To be determined.              Expected Discharge Plan:  Skilled Nursing Facility  In-House Referral:  Clinical Social Work  Discharge planning Services  CM Consult  Status of Service:  In process, will continue to follow  Kristen Cardinal, RN 05/02/2018, 1:23 PM
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
## 2018-05-03 DIAGNOSIS — E11649 Type 2 diabetes mellitus with hypoglycemia without coma: Secondary | ICD-10-CM | POA: Diagnosis present

## 2018-05-03 DIAGNOSIS — H547 Unspecified visual loss: Secondary | ICD-10-CM

## 2018-05-03 DIAGNOSIS — R410 Disorientation, unspecified: Secondary | ICD-10-CM | POA: Diagnosis not present

## 2018-05-03 DIAGNOSIS — N2581 Secondary hyperparathyroidism of renal origin: Secondary | ICD-10-CM | POA: Diagnosis not present

## 2018-05-03 DIAGNOSIS — G9341 Metabolic encephalopathy: Secondary | ICD-10-CM | POA: Diagnosis present

## 2018-05-03 DIAGNOSIS — Z593 Problems related to living in residential institution: Secondary | ICD-10-CM | POA: Diagnosis not present

## 2018-05-03 DIAGNOSIS — Z743 Need for continuous supervision: Secondary | ICD-10-CM | POA: Diagnosis not present

## 2018-05-03 DIAGNOSIS — R279 Unspecified lack of coordination: Secondary | ICD-10-CM | POA: Diagnosis not present

## 2018-05-03 DIAGNOSIS — R000 Tachycardia, unspecified: Secondary | ICD-10-CM | POA: Diagnosis present

## 2018-05-03 DIAGNOSIS — F22 Delusional disorders: Secondary | ICD-10-CM | POA: Diagnosis present

## 2018-05-03 DIAGNOSIS — E8889 Other specified metabolic disorders: Secondary | ICD-10-CM | POA: Diagnosis present

## 2018-05-03 DIAGNOSIS — Z89411 Acquired absence of right great toe: Secondary | ICD-10-CM

## 2018-05-03 DIAGNOSIS — M6281 Muscle weakness (generalized): Secondary | ICD-10-CM | POA: Diagnosis present

## 2018-05-03 DIAGNOSIS — N186 End stage renal disease: Secondary | ICD-10-CM | POA: Diagnosis not present

## 2018-05-03 DIAGNOSIS — F29 Unspecified psychosis not due to a substance or known physiological condition: Secondary | ICD-10-CM | POA: Diagnosis present

## 2018-05-03 DIAGNOSIS — F209 Schizophrenia, unspecified: Secondary | ICD-10-CM | POA: Diagnosis present

## 2018-05-03 DIAGNOSIS — I272 Pulmonary hypertension, unspecified: Secondary | ICD-10-CM | POA: Diagnosis present

## 2018-05-03 DIAGNOSIS — I120 Hypertensive chronic kidney disease with stage 5 chronic kidney disease or end stage renal disease: Secondary | ICD-10-CM | POA: Diagnosis not present

## 2018-05-03 DIAGNOSIS — I2583 Coronary atherosclerosis due to lipid rich plaque: Secondary | ICD-10-CM | POA: Diagnosis present

## 2018-05-03 DIAGNOSIS — Z89422 Acquired absence of other left toe(s): Secondary | ICD-10-CM

## 2018-05-03 DIAGNOSIS — R4587 Impulsiveness: Secondary | ICD-10-CM | POA: Diagnosis not present

## 2018-05-03 DIAGNOSIS — Z89421 Acquired absence of other right toe(s): Secondary | ICD-10-CM | POA: Diagnosis not present

## 2018-05-03 DIAGNOSIS — I2510 Atherosclerotic heart disease of native coronary artery without angina pectoris: Secondary | ICD-10-CM | POA: Diagnosis present

## 2018-05-03 DIAGNOSIS — I132 Hypertensive heart and chronic kidney disease with heart failure and with stage 5 chronic kidney disease, or end stage renal disease: Secondary | ICD-10-CM | POA: Diagnosis present

## 2018-05-03 DIAGNOSIS — Z89412 Acquired absence of left great toe: Secondary | ICD-10-CM | POA: Diagnosis not present

## 2018-05-03 DIAGNOSIS — I10 Essential (primary) hypertension: Secondary | ICD-10-CM | POA: Diagnosis present

## 2018-05-03 DIAGNOSIS — E785 Hyperlipidemia, unspecified: Secondary | ICD-10-CM | POA: Diagnosis present

## 2018-05-03 DIAGNOSIS — E875 Hyperkalemia: Secondary | ICD-10-CM | POA: Diagnosis present

## 2018-05-03 DIAGNOSIS — I071 Rheumatic tricuspid insufficiency: Secondary | ICD-10-CM | POA: Diagnosis present

## 2018-05-03 DIAGNOSIS — G253 Myoclonus: Secondary | ICD-10-CM | POA: Diagnosis present

## 2018-05-03 DIAGNOSIS — Z992 Dependence on renal dialysis: Secondary | ICD-10-CM | POA: Diagnosis not present

## 2018-05-03 DIAGNOSIS — I493 Ventricular premature depolarization: Secondary | ICD-10-CM | POA: Diagnosis present

## 2018-05-03 DIAGNOSIS — G9340 Encephalopathy, unspecified: Secondary | ICD-10-CM | POA: Diagnosis not present

## 2018-05-03 DIAGNOSIS — I5042 Chronic combined systolic (congestive) and diastolic (congestive) heart failure: Secondary | ICD-10-CM | POA: Diagnosis present

## 2018-05-03 DIAGNOSIS — E1151 Type 2 diabetes mellitus with diabetic peripheral angiopathy without gangrene: Secondary | ICD-10-CM | POA: Diagnosis present

## 2018-05-03 DIAGNOSIS — Z8674 Personal history of sudden cardiac arrest: Secondary | ICD-10-CM | POA: Diagnosis not present

## 2018-05-03 DIAGNOSIS — F4324 Adjustment disorder with disturbance of conduct: Secondary | ICD-10-CM

## 2018-05-03 DIAGNOSIS — H9190 Unspecified hearing loss, unspecified ear: Secondary | ICD-10-CM

## 2018-05-03 DIAGNOSIS — Z008 Encounter for other general examination: Secondary | ICD-10-CM | POA: Diagnosis not present

## 2018-05-03 DIAGNOSIS — D631 Anemia in chronic kidney disease: Secondary | ICD-10-CM | POA: Diagnosis not present

## 2018-05-03 DIAGNOSIS — E1122 Type 2 diabetes mellitus with diabetic chronic kidney disease: Secondary | ICD-10-CM | POA: Diagnosis not present

## 2018-05-03 DIAGNOSIS — I252 Old myocardial infarction: Secondary | ICD-10-CM | POA: Diagnosis not present

## 2018-05-03 LAB — CBC
HCT: 37.4 % (ref 36.0–46.0)
Hemoglobin: 12 g/dL (ref 12.0–15.0)
MCH: 30.8 pg (ref 26.0–34.0)
MCHC: 32.1 g/dL (ref 30.0–36.0)
MCV: 96.1 fL (ref 78.0–100.0)
Platelets: 194 K/uL (ref 150–400)
RBC: 3.89 MIL/uL (ref 3.87–5.11)
RDW: 16.6 % — ABNORMAL HIGH (ref 11.5–15.5)
WBC: 4 K/uL (ref 4.0–10.5)

## 2018-05-03 LAB — RENAL FUNCTION PANEL
Albumin: 3 g/dL — ABNORMAL LOW (ref 3.5–5.0)
Anion gap: 12 (ref 5–15)
BUN: 60 mg/dL — ABNORMAL HIGH (ref 6–20)
CO2: 27 mmol/L (ref 22–32)
Calcium: 8.7 mg/dL — ABNORMAL LOW (ref 8.9–10.3)
Chloride: 92 mmol/L — ABNORMAL LOW (ref 101–111)
Creatinine, Ser: 7.78 mg/dL — ABNORMAL HIGH (ref 0.44–1.00)
GFR calc Af Amer: 6 mL/min — ABNORMAL LOW
GFR calc non Af Amer: 5 mL/min — ABNORMAL LOW
Glucose, Bld: 74 mg/dL (ref 65–99)
Phosphorus: 5.3 mg/dL — ABNORMAL HIGH (ref 2.5–4.6)
Potassium: 4.9 mmol/L (ref 3.5–5.1)
Sodium: 131 mmol/L — ABNORMAL LOW (ref 135–145)

## 2018-05-03 LAB — GLUCOSE, CAPILLARY
Glucose-Capillary: 77 mg/dL (ref 65–99)
Glucose-Capillary: 79 mg/dL (ref 65–99)
Glucose-Capillary: 135 mg/dL — ABNORMAL HIGH (ref 65–99)
Glucose-Capillary: 131 mg/dL — ABNORMAL HIGH (ref 65–99)

## 2018-05-03 MED ORDER — DOXERCALCIFEROL 4 MCG/2ML IV SOLN
INTRAVENOUS | Status: AC
  Filled 2018-05-03: qty 2
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
## 2018-05-03 NOTE — NC FL2 (Signed)
[place] LEVEL OF CARE SCREENING TOOL     IDENTIFICATION  Patient Name: [name] Birthdate: 04-14-1956 Sex: female Admission Date (Current Location): 04/30/2018  [place] and [place] Number:  [name] [id number] [place] and Address:  [facility] [place] [address], [place], [place] [address]      Provider Number: [id number]  Attending Physician Name and Address:  Lavina Hamman, MD  Relative Name and Phone Number:  [name] - [phone] and [name]-sister - [phone] (mobile)    Current Level of Care: Hospital Recommended Level of Care: [place] Prior Approval Number:    Date Approved/Denied:   PASRR Number: [id number]  Discharge Plan: SNF    Current Diagnoses: Patient Active Problem List   Diagnosis Date Noted  . Benign hypertension with ESRD (end-stage renal disease) ([place]) 02/12/2018  . 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 Chronic combined systolic and diastolic congestive heart failure ([place]) 12/15/2015  . Dyslipidemia associated with type 2 diabetes mellitus ([place]) 12/15/2015  . Ventricular tachycardia, sustained ([place]) 12/15/2015  . Psychosis, paranoid ([place]) 08/20/2015  . Non compliance w medication regimen 08/20/2015  . Hyperkalemia 04/19/2015  . ESRD (end stage renal disease) ([place]) 04/19/2015  . PAD (peripheral artery disease) ([place]) 03/23/2015  . Acute encephalopathy   . FTT (failure to thrive) in adult 01/14/2015  . Blindness with deafness 12/16/2014  . Anemia in CKD (chronic kidney disease) 12/14/2014  . Cardiomyopathy, ischemic-EF 30-35% 11/21/2014  . Chronic constipation 12/24/2013  . Diabetes mellitus with ESRD (end-stage renal disease) ([place]) 12/17/202014  . Hypertensive heart disease with CHF (congestive heart failure) ([place]) 08/09/2009    Orientation RESPIRATION BLADDER Height & Weight     Self, Place  Normal Continent Weight: 211 lb 10.3 oz (96 kg) Height:  5\' 5"  (165.1 cm)  BEHAVIORAL SYMPTOMS/MOOD NEUROLOGICAL BOWEL NUTRITION STATUS      Continent Diet(Renal diet with 1200 mL fluid restriction)  AMBULATORY STATUS COMMUNICATION OF NEEDS Skin   Extensive Assist Non-Verbally(Patient primarily uses sign language) Normal                       Personal Care Assistance Level of Assistance  Bathing, Feeding, Dressing Bathing Assistance: Maximum assistance Feeding assistance: Limited assistance Dressing Assistance: Maximum assistance     Functional Limitations Info  Sight, Hearing, Speech Sight Info: Impaired(Patient is blind) Hearing Info: Impaired(Patient is deaf) Speech Info: Impaired(Patient primarily uses sign language)    SPECIAL CARE FACTORS FREQUENCY                       Contractures Contractures Info: Not present    Additional Factors Info  Code Status, Allergies, Insulin Sliding Scale Code Status Info: Full Allergies Info: No known allergies   Insulin Sliding Scale Info: 0-9 Units 3 times a day with meals and 0-5 Units insulin daily at bedtime       Current Medications (05/03/2018):  This is the current hospital active medication list Current Facility-Administered Medications  Medication Dose Route Frequency Provider Last Rate Last Dose  . 0.9 %  sodium chloride infusion  250 mL Intravenous PRN Opyd, Ilene Qua, MD      . acetaminophen (TYLENOL) tablet 650 mg  650 mg Oral Q6H PRN Opyd, Ilene Qua, MD   650 mg at 05/02/18 2253   Or  . acetaminophen (TYLENOL) suppository 650 mg  650 mg Rectal Q6H  PRN [name], MD      . amiodarone (PACERONE) tablet 200 mg  200 mg Oral QPM Opyd, Ilene Qua, MD   200 mg at 05/02/18 1829  . ARIPiprazole (ABILIFY) tablet 12 mg  12 mg Oral Daily Opyd, Ilene Qua, MD   12 mg at 05/02/18 0952  . atorvastatin (LIPITOR) tablet 10 mg  10 mg Oral QPM Opyd, Ilene Qua, MD   10 mg at 05/02/18 1829  . calcium acetate (PHOSLO) capsule  2,001 mg  2,001 mg Oral 3 times per day on Sun Tue Thu Sat Eulogio Bear U, DO   2,001 mg at 05/02/18 1834  . calcium acetate (PHOSLO) capsule 2,001 mg  2,001 mg Oral 2 times per day on Mon Wed Fri Vann, Jessica U, DO      . clopidogrel (PLAVIX) tablet 75 mg  75 mg Oral QPM Opyd, Ilene Qua, MD   75 mg at 05/02/18 1829  . doxercalciferol (HECTOROL) injection 4 mcg  4 mcg Intravenous Q M,W,F-HD Valentina Gu, NP   4 mcg at 05/03/18 1108  . feeding supplement (PRO-STAT SUGAR FREE 64) liquid 30 mL  30 mL Oral BID Eliseo Squires, Jessica U, DO   30 mL at 05/02/18 2253  . heparin injection 5,000 Units  5,000 Units Subcutaneous Q8H Opyd, Ilene Qua, MD   5,000 Units at 05/02/18 2254  . HYDROcodone-acetaminophen (NORCO/VICODIN) 5-325 MG per tablet 1-2 tablet  1-2 tablet Oral Q4H PRN Opyd, Ilene Qua, MD      . insulin aspart (novoLOG) injection 0-5 Units  0-5 Units Subcutaneous QHS Opyd, Timothy S, MD      . insulin aspart (novoLOG) injection 0-9 Units  0-9 Units Subcutaneous TID WC Opyd, Ilene Qua, MD   1 Units at 05/02/18 1829  . LORazepam (ATIVAN) injection 0.5-1 mg  0.5-1 mg Intravenous Q6H PRN Opyd, Ilene Qua, MD      . ondansetron (ZOFRAN) tablet 4 mg  4 mg Oral Q6H PRN Opyd, Ilene Qua, MD       Or  . ondansetron (ZOFRAN) injection 4 mg  4 mg Intravenous Q6H PRN Opyd, Ilene Qua, MD      . polyethylene glycol (MIRALAX / GLYCOLAX) packet 17 g  17 g Oral Daily Opyd, Ilene Qua, MD   17 g at 05/02/18 0953  . sodium chloride flush (NS) 0.9 % injection 3 mL  3 mL Intravenous Q12H Opyd, Ilene Qua, MD   3 mL at 05/01/18 1226  . sodium chloride flush (NS) 0.9 % injection 3 mL  3 mL Intravenous Q12H Opyd, Ilene Qua, MD   3 mL at 05/02/18 2254  . sodium chloride flush (NS) 0.9 % injection 3 mL  3 mL Intravenous PRN Opyd, Ilene Qua, MD      . traMADol (ULTRAM) tablet 50 mg  50 mg Oral Daily Opyd, Ilene Qua, MD   50 mg at 05/02/18 9735     Discharge Medications: Please see discharge summary for a list of discharge  medications.  Relevant Imaging Results:  Relevant Lab Results:   Additional Information ss#[id number]. Dialysis patinet MWF at [facility]. Right foot amputation, Left transmetatarsal amputation  Sharlet Salina Mila Homer, LCSW

## 2018-05-03 NOTE — Procedures (Signed)
I was present at this dialysis session. I have reviewed the session itself and made appropriate changes.   Filed Weights   05/01/18 1745 05/01/18 2006 05/03/18 0815  Weight: 96.3 kg (212 lb 4.9 oz) 96.3 kg (212 lb 4.9 oz) 96 kg (211 lb 10.3 oz)    Recent Labs  Lab 05/01/18 1400  NA 136  K 4.3  CL 99*  CO2 26  GLUCOSE 114*  BUN 49*  CREATININE 7.04*  CALCIUM 9.2  PHOS 3.8    Recent Labs  Lab 04/30/18 2213 04/30/18 2217 05/01/18 0223 05/01/18 0409 05/01/18 1401  WBC 8.2  --   --  8.5 7.8  NEUTROABS 5.7  --   --   --   --   HGB 11.5* 12.6  --  12.0 11.7*  HCT 36.0 37.0 21.3* 36.6 36.2  MCV 98.1  --   --  97.3 96.8  PLT 257  --   --  269 276    Scheduled Meds: . amiodarone  200 mg Oral QPM  . ARIPiprazole  12 mg Oral Daily  . atorvastatin  10 mg Oral QPM  . calcium acetate  2,001 mg Oral 3 times per day on Sun Tue Thu Sat  . calcium acetate  2,001 mg Oral 2 times per day on Mon Wed Fri  . clopidogrel  75 mg Oral QPM  . doxercalciferol  4 mcg Intravenous Q M,W,F-HD  . feeding supplement (PRO-STAT SUGAR FREE 64)  30 mL Oral BID  . heparin  5,000 Units Subcutaneous Q8H  . insulin aspart  0-5 Units Subcutaneous QHS  . insulin aspart  0-9 Units Subcutaneous TID WC  . polyethylene glycol  17 g Oral Daily  . sodium chloride flush  3 mL Intravenous Q12H  . sodium chloride flush  3 mL Intravenous Q12H  . traMADol  50 mg Oral Daily   Continuous Infusions: . sodium chloride     PRN Meds:.sodium chloride, acetaminophen **OR** acetaminophen, HYDROcodone-acetaminophen, LORazepam, ondansetron **OR** ondansetron (ZOFRAN) IV, sodium chloride flush   Donetta Potts,  MD 05/03/2018, 9:04 AM
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
## 2018-05-04 DIAGNOSIS — F4324 Adjustment disorder with disturbance of conduct: Principal | ICD-10-CM

## 2018-05-04 LAB — GLUCOSE, CAPILLARY
Glucose-Capillary: 78 mg/dL (ref 65–99)
Glucose-Capillary: 123 mg/dL — ABNORMAL HIGH (ref 65–99)
Glucose-Capillary: 123 mg/dL — ABNORMAL HIGH (ref 65–99)
Glucose-Capillary: 107 mg/dL — ABNORMAL HIGH (ref 65–99)
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
## 2018-05-05 LAB — GLUCOSE, CAPILLARY
Glucose-Capillary: 111 mg/dL — ABNORMAL HIGH (ref 65–99)
Glucose-Capillary: 125 mg/dL — ABNORMAL HIGH (ref 65–99)
Glucose-Capillary: 139 mg/dL — ABNORMAL HIGH (ref 65–99)
Glucose-Capillary: 76 mg/dL (ref 65–99)
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
## 2018-05-06 LAB — CBC
HCT: 30.7 % — ABNORMAL LOW (ref 36.0–46.0)
Hemoglobin: 10 g/dL — ABNORMAL LOW (ref 12.0–15.0)
MCH: 30.9 pg (ref 26.0–34.0)
MCHC: 32.6 g/dL (ref 30.0–36.0)
MCV: 94.8 fL (ref 78.0–100.0)
Platelets: 246 10*3/uL (ref 150–400)
RBC: 3.24 MIL/uL — ABNORMAL LOW (ref 3.87–5.11)
RDW: 15.9 % — ABNORMAL HIGH (ref 11.5–15.5)
WBC: 5.2 10*3/uL (ref 4.0–10.5)

## 2018-05-06 LAB — RENAL FUNCTION PANEL
Albumin: 3 g/dL — ABNORMAL LOW (ref 3.5–5.0)
Anion gap: 11 (ref 5–15)
BUN: 94 mg/dL — ABNORMAL HIGH (ref 6–20)
CO2: 25 mmol/L (ref 22–32)
Calcium: 8.8 mg/dL — ABNORMAL LOW (ref 8.9–10.3)
Chloride: 93 mmol/L — ABNORMAL LOW (ref 101–111)
Creatinine, Ser: 9.38 mg/dL — ABNORMAL HIGH (ref 0.44–1.00)
GFR calc Af Amer: 5 mL/min — ABNORMAL LOW (ref >60)
GFR calc non Af Amer: 4 mL/min — ABNORMAL LOW (ref >60)
Glucose, Bld: 82 mg/dL (ref 65–99)
Phosphorus: 5.2 mg/dL — ABNORMAL HIGH (ref 2.5–4.6)
Potassium: 4.8 mmol/L (ref 3.5–5.1)
Sodium: 129 mmol/L — ABNORMAL LOW (ref 135–145)

## 2018-05-06 LAB — GLUCOSE, CAPILLARY
Glucose-Capillary: 83 mg/dL (ref 65–99)
Glucose-Capillary: 123 mg/dL — ABNORMAL HIGH (ref 65–99)
Glucose-Capillary: 126 mg/dL — ABNORMAL HIGH (ref 65–99)

## 2018-05-06 LAB — MRSA PCR SCREENING: MRSA by PCR: NEGATIVE

## 2018-05-06 MED ORDER — DOXERCALCIFEROL 4 MCG/2ML IV SOLN
INTRAVENOUS | Status: AC
  Filled 2018-05-06: qty 2

## 2018-05-06 NOTE — Progress Notes (Signed)
[facility] Progress Note  Subjective: no c/o on hd, interpreter present  Vitals:   05/06/18 1030 05/06/18 1100 05/06/18 1130 05/06/18 1200  BP: (!) 147/73 (!) 134/50 120/60 (!) 95/56  Pulse: 67 68 72 70  Resp:      Temp:      TempSrc:      SpO2:      Weight:      Height:        Inpatient medications: . amiodarone  200 mg Oral QPM  . ARIPiprazole  12 mg Oral Daily  . atorvastatin  10 mg Oral QPM  . calcium acetate  2,001 mg Oral 3 times per day on Sun Tue Thu Sat  . calcium acetate  2,001 mg Oral 2 times per day on Mon Wed Fri  . clopidogrel  75 mg Oral QPM  . doxercalciferol  4 mcg Intravenous Q M,W,F-HD  . feeding supplement (PRO-STAT SUGAR FREE 64)  30 mL Oral BID  . heparin  5,000 Units Subcutaneous Q8H  . insulin aspart  0-5 Units Subcutaneous QHS  . insulin aspart  0-9 Units Subcutaneous TID WC  . polyethylene glycol  17 g Oral Daily  . sodium chloride flush  3 mL Intravenous Q12H  . sodium chloride flush  3 mL Intravenous Q12H  . traMADol  50 mg Oral Daily   . sodium chloride     sodium chloride, acetaminophen **OR** acetaminophen, HYDROcodone-acetaminophen, LORazepam, ondansetron **OR** ondansetron (ZOFRAN) IV, sodium chloride flush  Exam: Alert, blind and deaf No jvd Chest cta bilat RRR no mrg Abd soft ntnd  Ext no edema LUA AVF+bruit  [facility]  4h 38min   91kg  2/2.25 bath p2 heparin 9000 hectorol 4 ug tiw mircera 30 every 2wk due 5/15      Impression: 1  Acute altered mental status - better 2  Esrd mwf hd 3  Htn / vol stable, up 50kg by wts not sure accurate 4  DM2  5  Anemia ckd - hb 12.0 , stable no esa 6  Mbd ckd - cont binders/ sensipar/ vdra 7  Psych - hx psychosis but here psychiatrist said pt is not psychotic 8  Dispo pending   Plan - dialysis today   Kelly Splinter MD [facility] pager [phone]   05/06/2018, 12:15 PM   Recent Labs  Lab 05/01/18 1400 05/03/18 0907 05/06/18 0839  NA  136 131* 129*  K 4.3 4.9 4.8  CL 99* 92* 93*  CO2 26 27 25   GLUCOSE 114* 74 82  BUN 49* 60* 94*  CREATININE 7.04* 7.78* 9.38*  CALCIUM 9.2 8.7* 8.8*  PHOS 3.8 5.3* 5.2*   Recent Labs  Lab 05/01/18 1400 05/03/18 0907 05/06/18 0839  ALBUMIN 3.8 3.0* 3.0*   Recent Labs  Lab 04/30/18 2213  05/01/18 1401 05/03/18 0908 05/06/18 0838  WBC 8.2   < > 7.8 4.0 5.2  NEUTROABS 5.7  --   --   --   --   HGB 11.5*   < > 11.7* 12.0 10.0*  HCT 36.0   < > 36.2 37.4 30.7*  MCV 98.1   < > 96.8 96.1 94.8  PLT 257   < > 276 194 246   < > = values in this interval not displayed.   Iron/TIBC/Ferritin/ %Sat    Component Value Date/Time   IRON 74 12/14/2014 1445   TIBC 216 (L) 12/14/2014 1445   FERRITIN 675 (H) 12/29/2013 1510   IRONPCTSAT 34 12/14/2014  1445     

## 2018-05-06 NOTE — Plan of Care (Signed)
  Problem: Health Behavior/Discharge Planning: Goal: Ability to manage health-related needs will improve Outcome: Progressing   Problem: Clinical Measurements: Goal: Ability to maintain clinical measurements within normal limits will improve Outcome: Progressing Goal: Will remain free from infection Outcome: Progressing Goal: Diagnostic test results will improve Outcome: Progressing Goal: Respiratory complications will improve Outcome: Progressing Goal: Cardiovascular complication will be avoided Outcome: Progressing   Problem: Activity: Goal: Risk for activity intolerance will decrease Outcome: Progressing   Problem: Coping: Goal: Level of anxiety will decrease Outcome: Progressing   Problem: Elimination: Goal: Will not experience complications related to bowel motility Outcome: Progressing Goal: Will not experience complications related to urinary retention Outcome: Progressing   Problem: Skin Integrity: Goal: Risk for impaired skin integrity will decrease Outcome: Progressing

## 2018-05-06 NOTE — Procedures (Signed)
Stable on HD, no issues  I was present at this dialysis session, have reviewed the session itself and made  appropriate changes Kelly Splinter MD [place] pager [phone]   05/06/2018, 12:25 PM

## 2018-05-06 NOTE — Clinical Social Work Note (Signed)
CSW reviewed MD's note and patient ready for discharge pending SNF placement. CSW attempted to reach patient's son [name] and message left. Call made to Haynes Dage, patient's sister and talked with her regarding trying to reach son regarding placement for patient. Sister advised that there are no other facilities that can take patient. Ms. Richardson Landry again expressed that she feels patient is lonely and that at [facility] they know her and she will not have to go through an adjustment to a new facility and new staff. Sister in agreement with patient returning to [facility], but understands that CSW needs to talk to son. She will text son as he is at work and cannot always talk on his phone and CSW advised that this would be ok.  4:32 pm - CSW received a text from son [name] - "OK, yes for tomorrow can it be after dialysis. Conan Bowens"  4:34 pm - Call made to Gayla Medicus, hospital liaison for [facility] regarding conversation with patient's sister and text response from son. She will check in with facility regarding patient returning on Tuesday and get back with CSW.

## 2018-05-06 NOTE — Progress Notes (Addendum)
[facility] PROGRESS NOTE  Patient: [name] VTX:[id number]   PCP: [name], DO DOB: 1956/11/16   DOA: 04/30/2018   DOS: 05/06/2018    Subjective: No acute events overnight reported.  Attempted to see the patient twice, interpreter not available.  Objective:  Vitals:   05/06/18 1216 05/06/18 1500  BP: (!) 123/56 129/73  Pulse: 66 70  Resp: 18 14  Temp:  (!) 97.5 F (36.4 C)  SpO2: 100% 100%    Assessment and plan: No acute events, acute encephalopathy resolved. Awaiting placement at the SNF. Tolerated HD well and seen by nephrologist on HD.  Author: Berle Mull, MD [facility] Pager: [phone] 05/06/2018 4:23 PM   If 7PM-7AM, please contact night-coverage at [email or web address], password [facility]

## 2018-05-07 DIAGNOSIS — E1129 Type 2 diabetes mellitus with other diabetic kidney complication: Secondary | ICD-10-CM | POA: Diagnosis not present

## 2018-05-07 DIAGNOSIS — Z992 Dependence on renal dialysis: Secondary | ICD-10-CM | POA: Diagnosis not present

## 2018-05-07 DIAGNOSIS — K5909 Other constipation: Secondary | ICD-10-CM | POA: Diagnosis not present

## 2018-05-07 DIAGNOSIS — E1169 Type 2 diabetes mellitus with other specified complication: Secondary | ICD-10-CM | POA: Diagnosis not present

## 2018-05-07 DIAGNOSIS — Z743 Need for continuous supervision: Secondary | ICD-10-CM | POA: Diagnosis not present

## 2018-05-07 DIAGNOSIS — I110 Hypertensive heart disease with heart failure: Secondary | ICD-10-CM | POA: Diagnosis not present

## 2018-05-07 DIAGNOSIS — E1122 Type 2 diabetes mellitus with diabetic chronic kidney disease: Secondary | ICD-10-CM | POA: Diagnosis not present

## 2018-05-07 DIAGNOSIS — E875 Hyperkalemia: Secondary | ICD-10-CM | POA: Diagnosis present

## 2018-05-07 DIAGNOSIS — R627 Adult failure to thrive: Secondary | ICD-10-CM | POA: Diagnosis not present

## 2018-05-07 DIAGNOSIS — I472 Ventricular tachycardia: Secondary | ICD-10-CM | POA: Diagnosis not present

## 2018-05-07 DIAGNOSIS — F209 Schizophrenia, unspecified: Secondary | ICD-10-CM | POA: Diagnosis present

## 2018-05-07 DIAGNOSIS — E785 Hyperlipidemia, unspecified: Secondary | ICD-10-CM | POA: Diagnosis not present

## 2018-05-07 DIAGNOSIS — I2583 Coronary atherosclerosis due to lipid rich plaque: Secondary | ICD-10-CM | POA: Diagnosis present

## 2018-05-07 DIAGNOSIS — D631 Anemia in chronic kidney disease: Secondary | ICD-10-CM | POA: Diagnosis not present

## 2018-05-07 DIAGNOSIS — N186 End stage renal disease: Secondary | ICD-10-CM | POA: Diagnosis not present

## 2018-05-07 DIAGNOSIS — R000 Tachycardia, unspecified: Secondary | ICD-10-CM | POA: Diagnosis present

## 2018-05-07 DIAGNOSIS — F22 Delusional disorders: Secondary | ICD-10-CM | POA: Diagnosis not present

## 2018-05-07 DIAGNOSIS — N2581 Secondary hyperparathyroidism of renal origin: Secondary | ICD-10-CM | POA: Diagnosis not present

## 2018-05-07 DIAGNOSIS — F4324 Adjustment disorder with disturbance of conduct: Secondary | ICD-10-CM | POA: Diagnosis not present

## 2018-05-07 DIAGNOSIS — M6281 Muscle weakness (generalized): Secondary | ICD-10-CM | POA: Diagnosis present

## 2018-05-07 DIAGNOSIS — R279 Unspecified lack of coordination: Secondary | ICD-10-CM | POA: Diagnosis not present

## 2018-05-07 DIAGNOSIS — F200 Paranoid schizophrenia: Secondary | ICD-10-CM | POA: Diagnosis not present

## 2018-05-07 DIAGNOSIS — I739 Peripheral vascular disease, unspecified: Secondary | ICD-10-CM | POA: Diagnosis not present

## 2018-05-07 DIAGNOSIS — F29 Unspecified psychosis not due to a substance or known physiological condition: Secondary | ICD-10-CM | POA: Diagnosis present

## 2018-05-07 DIAGNOSIS — I10 Essential (primary) hypertension: Secondary | ICD-10-CM | POA: Diagnosis present

## 2018-05-07 DIAGNOSIS — I5042 Chronic combined systolic (congestive) and diastolic (congestive) heart failure: Secondary | ICD-10-CM | POA: Diagnosis not present

## 2018-05-07 DIAGNOSIS — I120 Hypertensive chronic kidney disease with stage 5 chronic kidney disease or end stage renal disease: Secondary | ICD-10-CM | POA: Diagnosis not present

## 2018-05-07 LAB — GLUCOSE, CAPILLARY
Glucose-Capillary: 75 mg/dL (ref 65–99)
Glucose-Capillary: 95 mg/dL (ref 65–99)
Glucose-Capillary: 97 mg/dL (ref 65–99)

## 2018-05-07 MED ORDER — TRAMADOL HCL 50 MG PO TABS: 50.0000 mg | ORAL_TABLET | Freq: Every day | ORAL | 0 refills | Status: DC

## 2018-05-07 NOTE — Progress Notes (Signed)
PTAR here to pick up pt to take back to [facility].  Pt left floor via stretcher by PTAR.

## 2018-05-07 NOTE — Progress Notes (Signed)
Called report to [place] at [facility].  Pt will be transported by PTAR.
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
## 2018-05-07 NOTE — Clinical Social Work Note (Signed)
Patient is medically stable for discharge and is returning to [place] (room [address]) skilled nursing facility, where she is a [facility]-term care resident. Patient informed this morning via interpreter Elta Guadeloupe and talked with Tammy, hospital liaison regarding patient's concern regarding her wheelchair. Per Tammy later today, they have a working wheelchair for patient. Son contacted and informed of d/c and ambulance transport. CSW signing off as patient is discharging today, however please advise if any other SW intervention services needed prior to patient leaving hospital.  Atiyah Bauer Givens, MSW, LCSW Licensed Clinical Social Worker [place] [phone]
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
## 2018-05-08 ENCOUNTER — Telehealth: Payer: Self-pay

## 2018-05-08 ENCOUNTER — Non-Acute Institutional Stay (SKILLED_NURSING_FACILITY): Payer: Medicare Other | Admitting: Adult Health

## 2018-05-08 ENCOUNTER — Encounter: Payer: Self-pay | Admitting: Adult Health

## 2018-05-08 DIAGNOSIS — I120 Hypertensive chronic kidney disease with stage 5 chronic kidney disease or end stage renal disease: Secondary | ICD-10-CM

## 2018-05-08 DIAGNOSIS — E785 Hyperlipidemia, unspecified: Secondary | ICD-10-CM

## 2018-05-08 DIAGNOSIS — N186 End stage renal disease: Secondary | ICD-10-CM | POA: Diagnosis not present

## 2018-05-08 DIAGNOSIS — E1122 Type 2 diabetes mellitus with diabetic chronic kidney disease: Secondary | ICD-10-CM | POA: Diagnosis not present

## 2018-05-08 DIAGNOSIS — F4324 Adjustment disorder with disturbance of conduct: Secondary | ICD-10-CM | POA: Diagnosis not present

## 2018-05-08 DIAGNOSIS — I739 Peripheral vascular disease, unspecified: Secondary | ICD-10-CM

## 2018-05-08 DIAGNOSIS — E1129 Type 2 diabetes mellitus with other diabetic kidney complication: Secondary | ICD-10-CM | POA: Diagnosis not present

## 2018-05-08 DIAGNOSIS — D631 Anemia in chronic kidney disease: Secondary | ICD-10-CM | POA: Diagnosis not present

## 2018-05-08 DIAGNOSIS — I472 Ventricular tachycardia, unspecified: Secondary | ICD-10-CM

## 2018-05-08 DIAGNOSIS — I5042 Chronic combined systolic (congestive) and diastolic (congestive) heart failure: Secondary | ICD-10-CM | POA: Diagnosis not present

## 2018-05-08 DIAGNOSIS — I110 Hypertensive heart disease with heart failure: Secondary | ICD-10-CM

## 2018-05-08 DIAGNOSIS — K5909 Other constipation: Secondary | ICD-10-CM | POA: Diagnosis not present

## 2018-05-08 DIAGNOSIS — Z992 Dependence on renal dialysis: Secondary | ICD-10-CM

## 2018-05-08 DIAGNOSIS — R627 Adult failure to thrive: Secondary | ICD-10-CM

## 2018-05-08 DIAGNOSIS — E1169 Type 2 diabetes mellitus with other specified complication: Secondary | ICD-10-CM | POA: Diagnosis not present

## 2018-05-08 DIAGNOSIS — N2581 Secondary hyperparathyroidism of renal origin: Secondary | ICD-10-CM | POA: Diagnosis not present

## 2018-05-08 NOTE — Telephone Encounter (Signed)
Possible re-admission to facility. This is a patient you were seeing at [facility]. [facility] F/U is needed if patient was re-admitted to facility upon discharge. Hospital discharge from [facility] on 05/07/2018
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
## 2018-05-09 ENCOUNTER — Non-Acute Institutional Stay (SKILLED_NURSING_FACILITY): Payer: Medicare Other | Admitting: Internal Medicine

## 2018-05-09 ENCOUNTER — Encounter: Payer: Self-pay | Admitting: Internal Medicine

## 2018-05-09 DIAGNOSIS — F22 Delusional disorders: Secondary | ICD-10-CM | POA: Diagnosis not present

## 2018-05-09 DIAGNOSIS — D631 Anemia in chronic kidney disease: Secondary | ICD-10-CM | POA: Diagnosis not present

## 2018-05-09 DIAGNOSIS — Z992 Dependence on renal dialysis: Secondary | ICD-10-CM | POA: Diagnosis not present

## 2018-05-09 DIAGNOSIS — F4324 Adjustment disorder with disturbance of conduct: Secondary | ICD-10-CM

## 2018-05-09 DIAGNOSIS — E1122 Type 2 diabetes mellitus with diabetic chronic kidney disease: Secondary | ICD-10-CM | POA: Diagnosis not present

## 2018-05-09 DIAGNOSIS — I5042 Chronic combined systolic (congestive) and diastolic (congestive) heart failure: Secondary | ICD-10-CM

## 2018-05-09 DIAGNOSIS — N186 End stage renal disease: Secondary | ICD-10-CM | POA: Diagnosis not present

## 2018-05-09 DIAGNOSIS — F200 Paranoid schizophrenia: Secondary | ICD-10-CM | POA: Diagnosis not present

## 2018-05-09 DIAGNOSIS — I120 Hypertensive chronic kidney disease with stage 5 chronic kidney disease or end stage renal disease: Secondary | ICD-10-CM | POA: Diagnosis not present
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
## 2018-05-10 DIAGNOSIS — N186 End stage renal disease: Secondary | ICD-10-CM | POA: Diagnosis not present

## 2018-05-10 DIAGNOSIS — N2581 Secondary hyperparathyroidism of renal origin: Secondary | ICD-10-CM | POA: Diagnosis not present

## 2018-05-10 DIAGNOSIS — D631 Anemia in chronic kidney disease: Secondary | ICD-10-CM | POA: Diagnosis not present

## 2018-05-10 DIAGNOSIS — Z992 Dependence on renal dialysis: Secondary | ICD-10-CM | POA: Diagnosis not present

## 2018-05-10 DIAGNOSIS — E1129 Type 2 diabetes mellitus with other diabetic kidney complication: Secondary | ICD-10-CM | POA: Diagnosis not present

## 2018-05-13 DIAGNOSIS — E1129 Type 2 diabetes mellitus with other diabetic kidney complication: Secondary | ICD-10-CM | POA: Diagnosis not present

## 2018-05-13 DIAGNOSIS — N186 End stage renal disease: Secondary | ICD-10-CM | POA: Diagnosis not present

## 2018-05-13 DIAGNOSIS — D631 Anemia in chronic kidney disease: Secondary | ICD-10-CM | POA: Diagnosis not present

## 2018-05-13 DIAGNOSIS — Z992 Dependence on renal dialysis: Secondary | ICD-10-CM | POA: Diagnosis not present

## 2018-05-13 DIAGNOSIS — N2581 Secondary hyperparathyroidism of renal origin: Secondary | ICD-10-CM | POA: Diagnosis not present

## 2018-05-15 ENCOUNTER — Non-Acute Institutional Stay (SKILLED_NURSING_FACILITY): Payer: Medicare Other | Admitting: Adult Health

## 2018-05-15 ENCOUNTER — Encounter: Payer: Self-pay | Admitting: Adult Health

## 2018-05-15 DIAGNOSIS — D631 Anemia in chronic kidney disease: Secondary | ICD-10-CM | POA: Diagnosis not present

## 2018-05-15 DIAGNOSIS — Z992 Dependence on renal dialysis: Secondary | ICD-10-CM

## 2018-05-15 DIAGNOSIS — N186 End stage renal disease: Secondary | ICD-10-CM

## 2018-05-15 DIAGNOSIS — I5042 Chronic combined systolic (congestive) and diastolic (congestive) heart failure: Secondary | ICD-10-CM

## 2018-05-15 DIAGNOSIS — E1129 Type 2 diabetes mellitus with other diabetic kidney complication: Secondary | ICD-10-CM | POA: Diagnosis not present

## 2018-05-15 DIAGNOSIS — E785 Hyperlipidemia, unspecified: Secondary | ICD-10-CM

## 2018-05-15 DIAGNOSIS — E1169 Type 2 diabetes mellitus with other specified complication: Secondary | ICD-10-CM | POA: Diagnosis not present

## 2018-05-15 DIAGNOSIS — I120 Hypertensive chronic kidney disease with stage 5 chronic kidney disease or end stage renal disease: Secondary | ICD-10-CM

## 2018-05-15 DIAGNOSIS — I110 Hypertensive heart disease with heart failure: Secondary | ICD-10-CM | POA: Diagnosis not present

## 2018-05-15 DIAGNOSIS — N2581 Secondary hyperparathyroidism of renal origin: Secondary | ICD-10-CM | POA: Diagnosis not present

## 2018-05-15 NOTE — Progress Notes (Signed)
Location:   [facility] Room Number: 202 A Place of Service:  SNF (31)   CODE STATUS: Full Code (Most form updated 06/07/15)  No Known Allergies  Chief Complaint  Patient presents with  . Medical Management of Chronic Issues    Hypertension; esrd; dyslipidemia; anemia. Weekly follow up for the first 30 days post hospitalization     HPI:  She is a 62 year old [place] term resident of this facility being seen for the management of her chronic illnesses: hypertension; esrd; dyslipidemia and anemia. She is unable to participate in the hpi or ros. Staff reports that she is doing betting in activities. There are no reports of agitation; change in appetite or uncontrolled pain. There are no nursing concerns at this time.   Past Medical History:  Diagnosis Date  . Anemia    Due to fibroids.  . Bilateral leg edema    a. Chronic.  Marland Kitchen Blind   . Cardiac arrest ([place]) 12/12/2014  . CHF (congestive heart failure) ([place])   . Coronary artery disease   . Deaf    USE SIGN INTERPRETER.  Divorced from husband but lives with him. Has daughter but she does not care for her.  . Diabetes mellitus    a. Per PCP note 2012 (A1C 9.2) - pt unwilling to take meds and was educated on risk of uncontrolled DM.  b. A1C 5.9 in 11/2013.  Marland Kitchen ESRD on hemodialysis ([place])    Mon, Wed, Fri  . Essential hypertension 08/09/2009   Qualifier: Diagnosis of  By: Sarita Haver  MD, Coralyn Mark    . Fibroid uterus   . Heart murmur   . Hyperlipidemia   . Hypertension    a. Has previously refused blood pressure meds.   . Leiomyoma of uterus 08/09/2009   Qualifier: Diagnosis of  By: Sarita Haver  MD, Coralyn Mark    . MI (myocardial infarction) ([place]) 11/2014  . Moderate tricuspid regurgitation   . Osteomyelitis ([place])   . Psychiatric disorder    She frequently exhibits paranoia and has been diagnosed with psychotic d/o NOS during hospital stay in the past. She is tangetial and perseverative during her visits. She apparently has had a  bad experience with mental health in [place] in the past and refuses to discuss mental issues for fear that she will be sent back there. Her paranoia, communication issues, financial woes and lack of fam  . Pulmonary hypertension ([place])     Past Surgical History:  Procedure Laterality Date  . AMPUTATION Bilateral 04/02/2015   Procedure: Amputation Bilateral Great Toes MTP Joint;  Surgeon: Newt Minion, MD;  Location: [place];  Service: Orthopedics;  Laterality: Bilateral;  . AMPUTATION Left 05/14/2015   Procedure: Left Transmetatarsal Amputation;  Surgeon: Newt Minion, MD;  Location: [place];  Service: Orthopedics;  Laterality: Left;  . AMPUTATION Right 08/20/2015   Procedure: Transmetatarsal Amputation Right Foot;  Surgeon: Newt Minion, MD;  Location: [place];  Service: Orthopedics;  Laterality: Right;  . AV FISTULA PLACEMENT Left 01/02/2014   Procedure: ARTERIOVENOUS (AV) FISTULA CREATION;  Surgeon: Rosetta Posner, MD;  Location: [place];  Service: Vascular;  Laterality: Left;  . INSERTION OF DIALYSIS CATHETER Right 01/02/2014   Procedure: INSERTION OF DIALYSIS CATHETER;  Surgeon: Rosetta Posner, MD;  Location: [place];  Service: Vascular;  Laterality: Right;  . NO PAST SURGERIES    . ORIF PATELLA Left 05/17/2015   Procedure: OPEN REDUCTION INTERNAL (ORIF) FIXATION PATELLA;  Surgeon: Beverely Low  Fernanda Drum, MD;  Location: [place];  Service: Orthopedics;  Laterality: Left;    Social History   Socioeconomic History  . Marital status: Widowed    Spouse name: Not on file  . Number of children: Not on file  . Years of education: Not on file  . Highest education level: Not on file  Occupational History  . Not on file  Social Needs  . Financial resource strain: Not on file  . Food insecurity:    Worry: Not on file    Inability: Not on file  . Transportation needs:    Medical: Not on file    Non-medical: Not on file  Tobacco Use  . Smoking status: Never Smoker  . Smokeless tobacco: Never Used  . Tobacco comment:  Prior dip  Substance and Sexual Activity  . Alcohol use: No  . Drug use: No  . Sexual activity: Not on file  Lifestyle  . Physical activity:    Days per week: Not on file    Minutes per session: Not on file  . Stress: Not on file  Relationships  . Social connections:    Talks on phone: Not on file    Gets together: Not on file    Attends religious service: Not on file    Active member of club or organization: Not on file    Attends meetings of clubs or organizations: Not on file    Relationship status: Not on file  . Intimate partner violence:    Fear of current or ex partner: Not on file    Emotionally abused: Not on file    Physically abused: Not on file    Forced sexual activity: Not on file  Other Topics Concern  . Not on file  Social History Narrative  . Not on file   Family History  Problem Relation Age of Onset  . Other Father        Drowned from fishing?  . Heart disease Unknown       VITAL SIGNS BP 111/72   Pulse 64   Temp 98.6 F (37 C)   Resp 18   Ht 5\' 5"  (1.651 m)   Wt 205 lb (93 kg)   SpO2 100%   BMI 34.11 kg/m   Outpatient Encounter Medications as of 05/15/2018  Medication Sig  . Amino Acids-Protein Hydrolys (FEEDING SUPPLEMENT, PRO-STAT SUGAR FREE 64,) LIQD Take 60 mLs by mouth 2 (two) times daily.  Marland Kitchen amiodarone (PACERONE) 200 MG tablet Take 200 mg by mouth every evening.   . ARIPiprazole (ABILIFY) 2 MG tablet Take 12 mg by mouth daily.   Marland Kitchen atorvastatin (LIPITOR) 10 MG tablet Take 10 mg by mouth every evening.  . calcium acetate (PHOSLO) 667 MG tablet Take 3 tablets by mouth before meals every Sunday, Tuesday, Thursday and Saturday for Dialysis.  Give 3 tablets by mouth two times a day every Mon, Wed, Friday for renal buffer  . clopidogrel (PLAVIX) 75 MG tablet Take 75 mg by mouth every evening.  . insulin aspart (NOVOLOG) 100 UNIT/ML injection Inject as per sliding scale subcutaneously after meals 0 - 70 = 0 units, notify MD is BS is less than  70 71 - 450 = 5 units. Notify MD if BS is greater than 450  . nitroGLYCERIN (NITROSTAT) 0.4 MG SL tablet Place 0.4 mg under the tongue every 5 (five) minutes as needed for chest pain.  . polyethylene glycol (MIRALAX / GLYCOLAX) packet Take 17 g by mouth daily.   Marland Kitchen  traMADol (ULTRAM) 50 MG tablet Take 1 tablet (50 mg total) by mouth daily.   No facility-administered encounter medications on file as of 05/15/2018.      SIGNIFICANT DIAGNOSTIC EXAMS   PREVIOUS  11-24-14: Myoview: 1. Fixed defects involving the apex, anterior lateral wall and the inferior wall. The apex and anterior lateral wall fixed defects aresuggestive for an infarct. No evidence for reversibility or ischemia. 2. Diffuse hypokinesia and minimal wall motion along the lateral wall. 3. Left ventricular ejection fraction is 35%. 4. High-risk stress test findings*.  04-16-15: 2-d echo: - Left ventricle: The cavity size was mildly dilated. Wall thickness was increased in a pattern of mild LVH. Systolic function was moderately to severely reduced. The estimated ejection fraction was in the range of 30% to 35%. There is akinesis of the anteroseptal and apical myocardium. Features are consistent with a pseudonormal left ventricular filling pattern, with concomitant abnormal relaxation and increased filling pressure (grade 2 diastolic dysfunction). Doppler parameters are consistent with high ventricular filling pressure. - Mitral valve: Calcified annulus. There was mild regurgitation. - Left atrium: The atrium was mildly dilated. - Pulmonary arteries: Systolic pressure was mildly increased.   NO NEW EXAMS   LABS REVIEWED: she will decline labs PREVIOUS     08-01-17: wbc 7.0; hgb 11.0; hct 33.3; mcv 97.1; plt 247; glucose 95; bun 64.2; creat 5.86; k+ 5.5; na++ 132; liver normal albumin 4.5 11-10-17: wbc 6.0; hgb 10.9; hct 34.7; mcv 88.8; plt 275; glucose 421; bun 25.3; creat 1.11; k+ 4.5; na++ 143; ca 8.6; liver normal albumin 3.1;  tsh 3.07; hgb a1c 10.8; chol 170; ldl 101; trig 165; hdl 36  04-06-18: hgb a1c 5.5 04-30-18: wbc 8.2; hgb 11.5; hct 36.0; mcv 98.1; plt 257; glucose 177; bun 49; creat 8.30; k+ 5.6; na++ 134; ammonia 38; vit B 12: 401; tsh 4.139 chol 57; ldl 103; trig 165; hdl 50; RPR: nr 05-03-18: wbc 4.0; hgb 12.0; hct 37.4; mcv 96.1; plt 194; glucose 74; bun 60; creat 7.78; k+ 4,9; na++131; ca 8.7; phos 5.3; albumin 3.0    NO NEW LABS.     Review of Systems  Unable to perform ROS: Other (is blind and deaf )    Physical Exam  Constitutional: She appears well-developed and well-nourished. No distress.  Neck: No thyromegaly present.  Cardiovascular: Normal rate, regular rhythm and intact distal pulses.  Murmur heard. Pulmonary/Chest: Effort normal and breath sounds normal. No respiratory distress.  Abdominal: Soft. Bowel sounds are normal. She exhibits no distension. There is no tenderness.  Musculoskeletal: She exhibits no edema.  Status post bilateral transmetatarsal amputations  Is able to move all extremities      Lymphadenopathy:    She has no cervical adenopathy.  Neurological: She is alert.  Skin: Skin is warm and dry. She is not diaphoretic.  Left upper arm a/v fistula: + thrill + bruitt      Psychiatric: She has a normal mood and affect.    ASSESSMENT/ PLAN:  TODAY:   1. Dyslipidemia associated with type 2 diabetes mellitus:  Without change; ldl 103; will continue lipitor 10 mg daily    2. ESRD on hemodialysis: is followed by nephrology. Will continue dialysis as scheduled. Will continue phoslo 667 mg 3 tabs three times daily on Sunday; Tuesday; Thursday; Saturday; give 2 tabs three times daily on Monday; Wednesday; Friday.    3. 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 Benign hypertension with ESRD and  hypertensive heart disease with chronic combined heart failure : 111/72 is presently stable   PREVIOUS   5. PAD: is without change is status post left  and right transmetatarsal amputation: will continue plavix 75 mg daily  Will continue ultram 50 mg daily for pain management   6. Chronic combined systolic and diastolic heart failure: has ischemic cardiomyopathy  EF is 30-35%  (04-16-15); is presently stable will continue to monitor   7. Sustained ventricular trachycardia: not a candidate for ICD. Is on amiodarone 200 mg daily for heart rate control will monitor   8. FTT in adult: is stable her current weight is 205 pounds; her albumin is 3.0  Will continue supplements per facility protocol.   9. 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   10. Chronic constipation: is stable will continue miralax daily  11. 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    MD is aware of resident's narcotic use and is in agreement with current plan of care. We will attempt to wean resident as apropriate   Ok Edwards NP [facility] Adult Medicine  Contact [phone] Monday through Friday 8am- 5pm  After hours call [phone]

## 2018-05-16 DIAGNOSIS — F200 Paranoid schizophrenia: Secondary | ICD-10-CM | POA: Diagnosis not present

## 2018-05-17 DIAGNOSIS — N186 End stage renal disease: Secondary | ICD-10-CM | POA: Diagnosis not present

## 2018-05-17 DIAGNOSIS — E1129 Type 2 diabetes mellitus with other diabetic kidney complication: Secondary | ICD-10-CM | POA: Diagnosis not present

## 2018-05-17 DIAGNOSIS — Z992 Dependence on renal dialysis: Secondary | ICD-10-CM | POA: Diagnosis not present

## 2018-05-17 DIAGNOSIS — N2581 Secondary hyperparathyroidism of renal origin: Secondary | ICD-10-CM | POA: Diagnosis not present

## 2018-05-17 DIAGNOSIS — D631 Anemia in chronic kidney disease: Secondary | ICD-10-CM | POA: Diagnosis not present

## 2018-05-18 ENCOUNTER — Encounter: Payer: Self-pay | Admitting: Adult Health

## 2018-05-20 ENCOUNTER — Encounter: Payer: Self-pay | Admitting: Adult Health

## 2018-05-20 DIAGNOSIS — E1129 Type 2 diabetes mellitus with other diabetic kidney complication: Secondary | ICD-10-CM | POA: Diagnosis not present

## 2018-05-20 DIAGNOSIS — Z992 Dependence on renal dialysis: Secondary | ICD-10-CM | POA: Diagnosis not present

## 2018-05-20 DIAGNOSIS — N186 End stage renal disease: Secondary | ICD-10-CM | POA: Diagnosis not present

## 2018-05-20 DIAGNOSIS — N2581 Secondary hyperparathyroidism of renal origin: Secondary | ICD-10-CM | POA: Diagnosis not present

## 2018-05-20 DIAGNOSIS — D631 Anemia in chronic kidney disease: Secondary | ICD-10-CM | POA: Diagnosis not present

## 2018-05-22 ENCOUNTER — Encounter: Payer: Self-pay | Admitting: Adult Health

## 2018-05-22 ENCOUNTER — Non-Acute Institutional Stay (SKILLED_NURSING_FACILITY): Payer: Medicare Other | Admitting: Adult Health

## 2018-05-22 DIAGNOSIS — N2581 Secondary hyperparathyroidism of renal origin: Secondary | ICD-10-CM | POA: Diagnosis not present

## 2018-05-22 DIAGNOSIS — N186 End stage renal disease: Secondary | ICD-10-CM | POA: Diagnosis not present

## 2018-05-22 DIAGNOSIS — I472 Ventricular tachycardia, unspecified: Secondary | ICD-10-CM

## 2018-05-22 DIAGNOSIS — I739 Peripheral vascular disease, unspecified: Secondary | ICD-10-CM | POA: Diagnosis not present

## 2018-05-22 DIAGNOSIS — R627 Adult failure to thrive: Secondary | ICD-10-CM | POA: Diagnosis not present

## 2018-05-22 DIAGNOSIS — E1129 Type 2 diabetes mellitus with other diabetic kidney complication: Secondary | ICD-10-CM | POA: Diagnosis not present

## 2018-05-22 DIAGNOSIS — D631 Anemia in chronic kidney disease: Secondary | ICD-10-CM | POA: Diagnosis not present

## 2018-05-22 DIAGNOSIS — I5042 Chronic combined systolic (congestive) and diastolic (congestive) heart failure: Secondary | ICD-10-CM | POA: Diagnosis not present

## 2018-05-22 DIAGNOSIS — Z992 Dependence on renal dialysis: Secondary | ICD-10-CM | POA: Diagnosis not present

## 2018-05-22 NOTE — Progress Notes (Signed)
Location:   [facility] Room Number: 202 A Place of Service:  SNF (31)   CODE STATUS: Full Code (Most form updated 06/07/15)(son has not signed)  No Known Allergies  Chief Complaint  Patient presents with  . Medical Management of Chronic Issues    PAD: chf; VT; ftt  Weekly follow up for the first thirty days post hospitalization.     HPI:  She is a 62 year old [place] term resident of this facility being seen for the management of her chronic  Illnesses: pad; chf; vt; ftt. She is unable to participate in the hpi or ros. There are no reports of any changes in appetite; no chest pain; no anxiety. There are no reports of behavioral issues present. No nursing concerns at this time.   Past Medical History:  Diagnosis Date  . Anemia    Due to fibroids.  . Anemia in CKD (chronic kidney disease) 12/14/2014  . Benign hypertension with ESRD (end-stage renal disease) ([place]) 02/12/2018  . Bilateral leg edema    a. Chronic.  Marland Kitchen Blind   . Cardiac arrest ([place]) 12/12/2014  . CHF (congestive heart failure) ([place])   . Coronary artery disease   . Deaf    USE SIGN INTERPRETER.  Divorced from husband but lives with him. Has daughter but she does not care for her.  . Diabetes mellitus    a. Per PCP note 2012 (A1C 9.2) - pt unwilling to take meds and was educated on risk of uncontrolled DM.  b. A1C 5.9 in 11/2013.  Marland Kitchen ESRD on hemodialysis ([place])    Mon, Wed, Fri  . Essential hypertension 08/09/2009   Qualifier: Diagnosis of  By: Sarita Haver  MD, Coralyn Mark    . Fibroid uterus   . Heart murmur   . Hyperlipidemia   . Hypertension    a. Has previously refused blood pressure meds.   . Leiomyoma of uterus 08/09/2009   Qualifier: Diagnosis of  By: Sarita Haver  MD, Coralyn Mark    . MI (myocardial infarction) ([place]) 11/2014  . Moderate tricuspid regurgitation   . Osteomyelitis ([place])   . Psychiatric disorder    She frequently exhibits paranoia and has been diagnosed with psychotic d/o NOS during hospital stay  in the past. She is tangetial and perseverative during her visits. She apparently has had a bad experience with mental health in [place] in the past and refuses to discuss mental issues for fear that she will be sent back there. Her paranoia, communication issues, financial woes and lack of fam  . Pulmonary hypertension ([place])     Past Surgical History:  Procedure Laterality Date  . AMPUTATION Bilateral 04/02/2015   Procedure: Amputation Bilateral Great Toes MTP Joint;  Surgeon: Newt Minion, MD;  Location: [place];  Service: Orthopedics;  Laterality: Bilateral;  . AMPUTATION Left 05/14/2015   Procedure: Left Transmetatarsal Amputation;  Surgeon: Newt Minion, MD;  Location: [place];  Service: Orthopedics;  Laterality: Left;  . AMPUTATION Right 08/20/2015   Procedure: Transmetatarsal Amputation Right Foot;  Surgeon: Newt Minion, MD;  Location: [place];  Service: Orthopedics;  Laterality: Right;  . AV FISTULA PLACEMENT Left 01/02/2014   Procedure: ARTERIOVENOUS (AV) FISTULA CREATION;  Surgeon: Rosetta Posner, MD;  Location: [place];  Service: Vascular;  Laterality: Left;  . INSERTION OF DIALYSIS CATHETER Right 01/02/2014   Procedure: INSERTION OF DIALYSIS CATHETER;  Surgeon: Rosetta Posner, MD;  Location: [place];  Service: Vascular;  Laterality: Right;  .  NO PAST SURGERIES    . ORIF PATELLA Left 05/17/2015   Procedure: OPEN REDUCTION INTERNAL (ORIF) FIXATION PATELLA;  Surgeon: Newt Minion, MD;  Location: [place];  Service: Orthopedics;  Laterality: Left;    Social History   Socioeconomic History  . Marital status: Widowed    Spouse name: Not on file  . Number of children: Not on file  . Years of education: Not on file  . Highest education level: Not on file  Occupational History  . Not on file  Social Needs  . Financial resource strain: Not on file  . Food insecurity:    Worry: Not on file    Inability: Not on file  . Transportation needs:    Medical: Not on file    Non-medical: Not on file  Tobacco  Use  . Smoking status: Never Smoker  . Smokeless tobacco: Never Used  . Tobacco comment: Prior dip  Substance and Sexual Activity  . Alcohol use: No  . Drug use: No  . Sexual activity: Not on file  Lifestyle  . Physical activity:    Days per week: Not on file    Minutes per session: Not on file  . Stress: Not on file  Relationships  . Social connections:    Talks on phone: Not on file    Gets together: Not on file    Attends religious service: Not on file    Active member of club or organization: Not on file    Attends meetings of clubs or organizations: Not on file    Relationship status: Not on file  . Intimate partner violence:    Fear of current or ex partner: Not on file    Emotionally abused: Not on file    Physically abused: Not on file    Forced sexual activity: Not on file  Other Topics Concern  . Not on file  Social History Narrative  . Not on file   Family History  Problem Relation Age of Onset  . Other Father        Drowned from fishing?  . Heart disease Unknown       VITAL SIGNS BP 111/72   Pulse 64   Temp 98.6 F (37 C)   Resp 18   Ht 5\' 5"  (1.651 m)   Wt 205 lb (93 kg)   SpO2 97%   BMI 34.11 kg/m   Outpatient Encounter Medications as of 05/22/2018  Medication Sig  . Amino Acids-Protein Hydrolys (FEEDING SUPPLEMENT, PRO-STAT SUGAR FREE 64,) LIQD Take 60 mLs by mouth 2 (two) times daily.  Marland Kitchen amiodarone (PACERONE) 200 MG tablet Take 200 mg by mouth every evening.   . ARIPiprazole (ABILIFY) 15 MG tablet Take 15 mg by mouth daily.   Marland Kitchen atorvastatin (LIPITOR) 10 MG tablet Take 10 mg by mouth every evening.  . calcium acetate (PHOSLO) 667 MG tablet Take 3 tablets by mouth before meals every Sunday, Tuesday, Thursday and Saturday for Dialysis.  Give 3 tablets by mouth two times a day every Mon, Wed, Friday for renal buffer  . clopidogrel (PLAVIX) 75 MG tablet Take 75 mg by mouth every evening.  . insulin aspart (NOVOLOG) 100 UNIT/ML injection Inject as  per sliding scale subcutaneously after meals 0 - 70 = 0 units, notify MD is BS is less than 70 71 - 450 = 5 units. Notify MD if BS is greater than 450  . nitroGLYCERIN (NITROSTAT) 0.4 MG SL tablet Place 0.4 mg under the tongue  every 5 (five) minutes as needed for chest pain.  . polyethylene glycol (MIRALAX / GLYCOLAX) packet Take 17 g by mouth daily.   . traMADol (ULTRAM) 50 MG tablet Take 1 tablet (50 mg total) by mouth daily.   No facility-administered encounter medications on file as of 05/22/2018.      SIGNIFICANT DIAGNOSTIC EXAMS  PREVIOUS  11-24-14: Myoview: 1. Fixed defects involving the apex, anterior lateral wall and the inferior wall. The apex and anterior lateral wall fixed defects aresuggestive for an infarct. No evidence for reversibility or ischemia. 2. Diffuse hypokinesia and minimal wall motion along the lateral wall. 3. Left ventricular ejection fraction is 35%. 4. High-risk stress test findings*.  04-16-15: 2-d echo: - Left ventricle: The cavity size was mildly dilated. Wall thickness was increased in a pattern of mild LVH. Systolic function was moderately to severely reduced. The estimated ejection fraction was in the range of 30% to 35%. There is akinesis of the anteroseptal and apical myocardium. Features are consistent with a pseudonormal left ventricular filling pattern, with concomitant abnormal relaxation and increased filling pressure (grade 2 diastolic dysfunction). Doppler parameters are consistent with high ventricular filling pressure. - Mitral valve: Calcified annulus. There was mild regurgitation. - Left atrium: The atrium was mildly dilated. - Pulmonary arteries: Systolic pressure was mildly increased.   NO NEW EXAMS   LABS REVIEWED: she will decline labs PREVIOUS     08-01-17: wbc 7.0; hgb 11.0; hct 33.3; mcv 97.1; plt 247; glucose 95; bun 64.2; creat 5.86; k+ 5.5; na++ 132; liver normal albumin 4.5 11-10-17: wbc 6.0; hgb 10.9; hct 34.7; mcv 88.8; plt  275; glucose 421; bun 25.3; creat 1.11; k+ 4.5; na++ 143; ca 8.6; liver normal albumin 3.1; tsh 3.07; hgb a1c 10.8; chol 170; ldl 101; trig 165; hdl 36  04-06-18: hgb a1c 5.5 04-30-18: wbc 8.2; hgb 11.5; hct 36.0; mcv 98.1; plt 257; glucose 177; bun 49; creat 8.30; k+ 5.6; na++ 134; ammonia 38; vit B 12: 401; tsh 4.139 chol 57; ldl 103; trig 165; hdl 50; RPR: nr 05-03-18: wbc 4.0; hgb 12.0; hct 37.4; mcv 96.1; plt 194; glucose 74; bun 60; creat 7.78; k+ 4,9; na++131; ca 8.7; phos 5.3; albumin 3.0    NO NEW LABS.    Review of Systems  Unable to perform ROS: Other (is deaf and blind )   Physical Exam  Constitutional: She appears well-developed and well-nourished. No distress.  Neck: No thyromegaly present.  Cardiovascular: Normal rate, regular rhythm and intact distal pulses.  Murmur heard. 1/6  Pulmonary/Chest: Effort normal and breath sounds normal. No respiratory distress.  Abdominal: Soft. Bowel sounds are normal. She exhibits no distension. There is no tenderness.  Musculoskeletal: She exhibits no edema.  Status post bilateral transmetatarsal amputations  Is able to move all extremities     Lymphadenopathy:    She has no cervical adenopathy.  Neurological: She is alert.  Skin: Skin is warm and dry. She is not diaphoretic.  Left upper arm a/v fistula: + thrill + bruitt       Psychiatric: She has a normal mood and affect.    ASSESSMENT/ PLAN:  TODAY:   1. PAD: is without change is status post left and right transmetatarsal amputation: will continue plavix 75 mg daily  Will continue ultram 50 mg daily for pain management   2. Chronic combined systolic and diastolic heart failure: has ischemic cardiomyopathy  EF is 30-35%  (04-16-15); is presently stable will continue to monitor   3. Sustained  ventricular trachycardia: not a candidate for ICD. Is on amiodarone 200 mg daily for heart rate control will monitor   4. FTT in adult: is stable her current weight is 205 pounds; her albumin  is 3.0  Will continue supplements per facility protocol.    PREVIOUS   5. 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   6. Chronic constipation: is stable will continue miralax daily  7. 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   8. Dyslipidemia associated with type 2 diabetes mellitus:  Without change; ldl 103; will continue lipitor 10 mg daily    9. ESRD on hemodialysis: is followed by nephrology. Will continue dialysis as scheduled. Will continue phoslo 667 mg 3 tabs three times daily on Sunday; Tuesday; Thursday; Saturday; give 2 tabs three times daily on Monday; Wednesday; Friday.    10. Anemia in CKD on chronic dialysis: stable  hgb 12.0 will monitor   11. Benign hypertension with ESRD and hypertensive heart disease with chronic combined heart failure : 111/72 is presently stable     MD is aware of resident's narcotic use and is in agreement with current plan of care. We will attempt to wean resident as apropriate   Ok Edwards NP [facility] Adult Medicine  Contact [phone] Monday through Friday 8am- 5pm  After hours call [phone]

## 2018-05-23 DIAGNOSIS — F200 Paranoid schizophrenia: Secondary | ICD-10-CM | POA: Diagnosis not present

## 2018-05-24 DIAGNOSIS — D631 Anemia in chronic kidney disease: Secondary | ICD-10-CM | POA: Diagnosis not present

## 2018-05-24 DIAGNOSIS — N186 End stage renal disease: Secondary | ICD-10-CM | POA: Diagnosis not present

## 2018-05-24 DIAGNOSIS — N2581 Secondary hyperparathyroidism of renal origin: Secondary | ICD-10-CM | POA: Diagnosis not present

## 2018-05-24 DIAGNOSIS — E1129 Type 2 diabetes mellitus with other diabetic kidney complication: Secondary | ICD-10-CM | POA: Diagnosis not present

## 2018-05-24 DIAGNOSIS — Z992 Dependence on renal dialysis: Secondary | ICD-10-CM | POA: Diagnosis not present

## 2018-05-25 DIAGNOSIS — E1129 Type 2 diabetes mellitus with other diabetic kidney complication: Secondary | ICD-10-CM | POA: Diagnosis not present

## 2018-05-25 DIAGNOSIS — N186 End stage renal disease: Secondary | ICD-10-CM | POA: Diagnosis not present

## 2018-05-25 DIAGNOSIS — Z992 Dependence on renal dialysis: Secondary | ICD-10-CM | POA: Diagnosis not present

## 2018-05-27 ENCOUNTER — Other Ambulatory Visit: Payer: Self-pay

## 2018-05-27 DIAGNOSIS — D631 Anemia in chronic kidney disease: Secondary | ICD-10-CM | POA: Diagnosis not present

## 2018-05-27 DIAGNOSIS — N186 End stage renal disease: Secondary | ICD-10-CM | POA: Diagnosis not present

## 2018-05-27 DIAGNOSIS — E1129 Type 2 diabetes mellitus with other diabetic kidney complication: Secondary | ICD-10-CM | POA: Diagnosis not present

## 2018-05-27 DIAGNOSIS — Z992 Dependence on renal dialysis: Secondary | ICD-10-CM | POA: Diagnosis not present

## 2018-05-27 DIAGNOSIS — N2581 Secondary hyperparathyroidism of renal origin: Secondary | ICD-10-CM | POA: Diagnosis not present

## 2018-05-27 MED ORDER — TRAMADOL HCL 50 MG PO TABS: 50.0000 mg | ORAL_TABLET | Freq: Every day | ORAL | 0 refills | Status: DC

## 2018-05-27 NOTE — Telephone Encounter (Signed)
Rx faxed to [facility] (P) [phone], (F) [phone] 

## 2018-05-29 ENCOUNTER — Encounter: Payer: Self-pay | Admitting: Adult Health

## 2018-05-29 DIAGNOSIS — N2581 Secondary hyperparathyroidism of renal origin: Secondary | ICD-10-CM | POA: Diagnosis not present

## 2018-05-29 DIAGNOSIS — N186 End stage renal disease: Secondary | ICD-10-CM | POA: Diagnosis not present

## 2018-05-29 DIAGNOSIS — D631 Anemia in chronic kidney disease: Secondary | ICD-10-CM | POA: Diagnosis not present

## 2018-05-29 DIAGNOSIS — E1129 Type 2 diabetes mellitus with other diabetic kidney complication: Secondary | ICD-10-CM | POA: Diagnosis not present

## 2018-05-29 DIAGNOSIS — Z992 Dependence on renal dialysis: Secondary | ICD-10-CM | POA: Diagnosis not present

## 2018-05-29 NOTE — Progress Notes (Signed)
Entered in error

## 2018-05-31 DIAGNOSIS — E1129 Type 2 diabetes mellitus with other diabetic kidney complication: Secondary | ICD-10-CM | POA: Diagnosis not present

## 2018-05-31 DIAGNOSIS — D631 Anemia in chronic kidney disease: Secondary | ICD-10-CM | POA: Diagnosis not present

## 2018-05-31 DIAGNOSIS — N2581 Secondary hyperparathyroidism of renal origin: Secondary | ICD-10-CM | POA: Diagnosis not present

## 2018-05-31 DIAGNOSIS — Z992 Dependence on renal dialysis: Secondary | ICD-10-CM | POA: Diagnosis not present

## 2018-05-31 DIAGNOSIS — N186 End stage renal disease: Secondary | ICD-10-CM | POA: Diagnosis not present

## 2018-06-03 DIAGNOSIS — N186 End stage renal disease: Secondary | ICD-10-CM | POA: Diagnosis not present

## 2018-06-03 DIAGNOSIS — D631 Anemia in chronic kidney disease: Secondary | ICD-10-CM | POA: Diagnosis not present

## 2018-06-03 DIAGNOSIS — N2581 Secondary hyperparathyroidism of renal origin: Secondary | ICD-10-CM | POA: Diagnosis not present

## 2018-06-03 DIAGNOSIS — Z992 Dependence on renal dialysis: Secondary | ICD-10-CM | POA: Diagnosis not present

## 2018-06-03 DIAGNOSIS — E1129 Type 2 diabetes mellitus with other diabetic kidney complication: Secondary | ICD-10-CM | POA: Diagnosis not present

## 2018-06-05 DIAGNOSIS — N2581 Secondary hyperparathyroidism of renal origin: Secondary | ICD-10-CM | POA: Diagnosis not present

## 2018-06-05 DIAGNOSIS — N186 End stage renal disease: Secondary | ICD-10-CM | POA: Diagnosis not present

## 2018-06-05 DIAGNOSIS — E1129 Type 2 diabetes mellitus with other diabetic kidney complication: Secondary | ICD-10-CM | POA: Diagnosis not present

## 2018-06-05 DIAGNOSIS — Z992 Dependence on renal dialysis: Secondary | ICD-10-CM | POA: Diagnosis not present

## 2018-06-05 DIAGNOSIS — D631 Anemia in chronic kidney disease: Secondary | ICD-10-CM | POA: Diagnosis not present

## 2018-06-06 ENCOUNTER — Encounter: Payer: Self-pay | Admitting: Adult Health

## 2018-06-06 ENCOUNTER — Non-Acute Institutional Stay (SKILLED_NURSING_FACILITY): Payer: Medicare Other | Admitting: Adult Health

## 2018-06-06 DIAGNOSIS — F200 Paranoid schizophrenia: Secondary | ICD-10-CM | POA: Diagnosis not present

## 2018-06-06 DIAGNOSIS — N186 End stage renal disease: Secondary | ICD-10-CM | POA: Diagnosis not present

## 2018-06-06 DIAGNOSIS — F4324 Adjustment disorder with disturbance of conduct: Secondary | ICD-10-CM | POA: Diagnosis not present

## 2018-06-06 DIAGNOSIS — E1169 Type 2 diabetes mellitus with other specified complication: Secondary | ICD-10-CM | POA: Diagnosis not present

## 2018-06-06 DIAGNOSIS — E785 Hyperlipidemia, unspecified: Secondary | ICD-10-CM

## 2018-06-06 DIAGNOSIS — K5909 Other constipation: Secondary | ICD-10-CM | POA: Diagnosis not present

## 2018-06-06 DIAGNOSIS — E1122 Type 2 diabetes mellitus with diabetic chronic kidney disease: Secondary | ICD-10-CM | POA: Diagnosis not present

## 2018-06-06 NOTE — Progress Notes (Signed)
Location:   [facility] Room Number: 202 A Place of Service:  SNF (31)   CODE STATUS: full code   No Known Allergies  Chief Complaint  Patient presents with  . Medical Management of Chronic Issues    Dyslipidemia; diabetes; constipation; adjustment disorder.     HPI:  She is a 62 year old [facility] term resident of this facility being seen for the management of her chronic illnesses: dyslipidemia; diabetes; constipation; adjustment disorder. She is unable to participate in the hpi or ros. There are no reports of changes in appetite; hypo or hyperglycemia; no reports of behavioral issues. There are no nursing concerns at this time.   Past Medical History:  Diagnosis Date  . Anemia    Due to fibroids.  . Anemia in CKD (chronic kidney disease) 12/14/2014  . Benign hypertension with ESRD (end-stage renal disease) ([place]) 02/12/2018  . Bilateral leg edema    a. Chronic.  Marland Kitchen Blind   . Cardiac arrest ([place]) 12/12/2014  . CHF (congestive heart failure) ([place])   . Coronary artery disease   . Deaf    USE SIGN INTERPRETER.  Divorced from husband but lives with him. Has daughter but she does not care for her.  . Diabetes mellitus    a. Per PCP note 2012 (A1C 9.2) - pt unwilling to take meds and was educated on risk of uncontrolled DM.  b. A1C 5.9 in 11/2013.  Marland Kitchen ESRD on hemodialysis ([place])    Mon, Wed, Fri  . Essential hypertension 08/09/2009   Qualifier: Diagnosis of  By: Sarita Haver  MD, Coralyn Mark    . Fibroid uterus   . Heart murmur   . Hyperlipidemia   . Hypertension    a. Has previously refused blood pressure meds.   . Leiomyoma of uterus 08/09/2009   Qualifier: Diagnosis of  By: Sarita Haver  MD, Coralyn Mark    . MI (myocardial infarction) ([place]) 11/2014  . Moderate tricuspid regurgitation   . Osteomyelitis ([place])   . Psychiatric disorder    She frequently exhibits paranoia and has been diagnosed with psychotic d/o NOS during hospital stay in the past. She is tangetial and perseverative  during her visits. She apparently has had a bad experience with mental health in [place] in the past and refuses to discuss mental issues for fear that she will be sent back there. Her paranoia, communication issues, financial woes and lack of fam  . Pulmonary hypertension ([place])     Past Surgical History:  Procedure Laterality Date  . AMPUTATION Bilateral 04/02/2015   Procedure: Amputation Bilateral Great Toes MTP Joint;  Surgeon: Newt Minion, MD;  Location: [facility];  Service: Orthopedics;  Laterality: Bilateral;  . AMPUTATION Left 05/14/2015   Procedure: Left Transmetatarsal Amputation;  Surgeon: Newt Minion, MD;  Location: [facility];  Service: Orthopedics;  Laterality: Left;  . AMPUTATION Right 08/20/2015   Procedure: Transmetatarsal Amputation Right Foot;  Surgeon: Newt Minion, MD;  Location: [facility];  Service: Orthopedics;  Laterality: Right;  . AV FISTULA PLACEMENT Left 01/02/2014   Procedure: ARTERIOVENOUS (AV) FISTULA CREATION;  Surgeon: Rosetta Posner, MD;  Location: [facility];  Service: Vascular;  Laterality: Left;  . INSERTION OF DIALYSIS CATHETER Right 01/02/2014   Procedure: INSERTION OF DIALYSIS CATHETER;  Surgeon: Rosetta Posner, MD;  Location: [facility];  Service: Vascular;  Laterality: Right;  . NO PAST SURGERIES    . ORIF PATELLA Left 05/17/2015   Procedure: OPEN REDUCTION INTERNAL (ORIF) FIXATION  PATELLA;  Surgeon: Newt Minion, MD;  Location: [facility];  Service: Orthopedics;  Laterality: Left;    Social History   Socioeconomic History  . Marital status: Widowed    Spouse name: Not on file  . Number of children: Not on file  . Years of education: Not on file  . Highest education level: Not on file  Occupational History  . Not on file  Social Needs  . Financial resource strain: Not on file  . Food insecurity:    Worry: Not on file    Inability: Not on file  . Transportation needs:    Medical: Not on file    Non-medical: Not on file  Tobacco Use  . Smoking status: Never Smoker  .  Smokeless tobacco: Never Used  . Tobacco comment: Prior dip  Substance and Sexual Activity  . Alcohol use: No  . Drug use: No  . Sexual activity: Not on file  Lifestyle  . Physical activity:    Days per week: Not on file    Minutes per session: Not on file  . Stress: Not on file  Relationships  . Social connections:    Talks on phone: Not on file    Gets together: Not on file    Attends religious service: Not on file    Active member of club or organization: Not on file    Attends meetings of clubs or organizations: Not on file    Relationship status: Not on file  . Intimate partner violence:    Fear of current or ex partner: Not on file    Emotionally abused: Not on file    Physically abused: Not on file    Forced sexual activity: Not on file  Other Topics Concern  . Not on file  Social History Narrative  . Not on file   Family History  Problem Relation Age of Onset  . Other Father        Drowned from fishing?  . Heart disease Unknown       VITAL SIGNS BP 116/78   Pulse 68   Temp 98 F (36.7 C)   Resp 18   Ht 5\' 5"  (1.651 m)   Wt 205 lb (93 kg)   SpO2 98%   BMI 34.11 kg/m   Outpatient Encounter Medications as of 06/06/2018  Medication Sig  . amiodarone (PACERONE) 200 MG tablet Take 200 mg by mouth every evening.   . ARIPiprazole (ABILIFY) 15 MG tablet Take 15 mg by mouth daily.   Marland Kitchen atorvastatin (LIPITOR) 10 MG tablet Take 10 mg by mouth every evening.  . calcium acetate (PHOSLO) 667 MG tablet Take 3 tablets by mouth before meals every Sunday, Tuesday, Thursday and Saturday for Dialysis.  Give 3 tablets by mouth two times a day every Mon, Wed, Friday for renal buffer  . clopidogrel (PLAVIX) 75 MG tablet Take 75 mg by mouth every evening.  . insulin aspart (NOVOLOG) 100 UNIT/ML injection Inject as per sliding scale subcutaneously after meals 0 - 70 = 0 units, notify MD is BS is less than 70 71 - 450 = 5 units. Notify MD if BS is greater than 450  .  nitroGLYCERIN (NITROSTAT) 0.4 MG SL tablet Place 0.4 mg under the tongue every 5 (five) minutes as needed for chest pain.  . polyethylene glycol (MIRALAX / GLYCOLAX) packet Take 17 g by mouth daily.   . traMADol (ULTRAM) 50 MG tablet Take 1 tablet (50 mg total) by mouth daily.  . [  DISCONTINUED] Amino Acids-Protein Hydrolys (FEEDING SUPPLEMENT, PRO-STAT SUGAR FREE 64,) LIQD Take 60 mLs by mouth 2 (two) times daily.   No facility-administered encounter medications on file as of 06/06/2018.      SIGNIFICANT DIAGNOSTIC EXAMS  PREVIOUS  11-24-14: Myoview: 1. Fixed defects involving the apex, anterior lateral wall and the inferior wall. The apex and anterior lateral wall fixed defects aresuggestive for an infarct. No evidence for reversibility or ischemia. 2. Diffuse hypokinesia and minimal wall motion along the lateral wall. 3. Left ventricular ejection fraction is 35%. 4. High-risk stress test findings*.  04-16-15: 2-d echo: - Left ventricle: The cavity size was mildly dilated. Wall thickness was increased in a pattern of mild LVH. Systolic function was moderately to severely reduced. The estimated ejection fraction was in the range of 30% to 35%. There is akinesis of the anteroseptal and apical myocardium. Features are consistent with a pseudonormal left ventricular filling pattern, with concomitant abnormal relaxation and increased filling pressure (grade 2 diastolic dysfunction). Doppler parameters are consistent with high ventricular filling pressure. - Mitral valve: Calcified annulus. There was mild regurgitation. - Left atrium: The atrium was mildly dilated. - Pulmonary arteries: Systolic pressure was mildly increased.   NO NEW EXAMS   LABS REVIEWED: she will decline labs PREVIOUS     08-01-17: wbc 7.0; hgb 11.0; hct 33.3; mcv 97.1; plt 247; glucose 95; bun 64.2; creat 5.86; k+ 5.5; na++ 132; liver normal albumin 4.5 11-10-17: wbc 6.0; hgb 10.9; hct 34.7; mcv 88.8; plt 275; glucose 421;  bun 25.3; creat 1.11; k+ 4.5; na++ 143; ca 8.6; liver normal albumin 3.1; tsh 3.07; hgb a1c 10.8; chol 170; ldl 101; trig 165; hdl 36  04-06-18: hgb a1c 5.5 04-30-18: wbc 8.2; hgb 11.5; hct 36.0; mcv 98.1; plt 257; glucose 177; bun 49; creat 8.30; k+ 5.6; na++ 134; ammonia 38; vit B 12: 401; tsh 4.13-19 chol 57; ldl 103; trig 165; hdl 50; RPR: nr 05-03-18: wbc 4.0; hgb 12.0; hct 37.4; mcv 96.1; plt 194; glucose 74; bun 60; creat 7.78; k+ 4,9; na++131; ca 8.7; phos 5.3; albumin 3.0    NO NEW LABS.    Review of Systems  Unable to perform ROS: Other (is deaf and blind )    Physical Exam  Constitutional: She appears well-developed and well-nourished. No distress.  Neck: No thyromegaly present.  Cardiovascular: Normal rate, regular rhythm and intact distal pulses.  Murmur heard. 1/6  Pulmonary/Chest: Effort normal and breath sounds normal. No respiratory distress.  Abdominal: Soft. Bowel sounds are normal. She exhibits no distension. There is no tenderness.  Musculoskeletal: She exhibits no edema.  Status post bilateral transmetatarsal amputations  Is able to move all extremities      Lymphadenopathy:    She has no cervical adenopathy.  Neurological: She is alert.  Skin: Skin is warm and dry. She is not diaphoretic.  Left upper arm a/v fistula: + thrill + bruitt           ASSESSMENT/ PLAN:  TODAY:   1. 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   2. Chronic constipation: is stable will continue miralax daily  3. 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   4. Dyslipidemia associated with type 2 diabetes mellitus:  Without change; ldl 103; will continue lipitor 10  mg daily     PREVIOUS   5. ESRD on hemodialysis: is followed by nephrology. Will continue dialysis as scheduled. Will  continue phoslo 667 mg 3 tabs three times daily on Sunday; Tuesday; Thursday; Saturday; give 2 tabs three times daily on Monday; Wednesday; Friday.    6. Anemia in CKD on chronic dialysis: stable  hgb 12.0 will monitor   7. 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 PAD: is without change is status post left and right transmetatarsal amputation: will continue plavix 75 mg daily  Will continue ultram 50 mg daily for pain management   9. Chronic combined systolic and diastolic heart failure: has ischemic cardiomyopathy  EF is 30-35%  (04-16-15); is presently stable will continue to monitor   10. Sustained ventricular trachycardia: not a candidate for ICD. Is on amiodarone 200 mg daily for heart rate control will monitor       MD is aware of resident's narcotic use and is in agreement with current plan of care. We will attempt to wean resident as apropriate   Ok Edwards NP [facility]  Contact [phone] Monday through Friday 8am- 5pm  After hours call [phone]

## 2018-06-06 NOTE — Progress Notes (Signed)
Location:   [facility] Room Number: 202 A Place of Service:  SNF (31)   CODE STATUS: Full Code (Most form updated 06/07/15)(son has not signed)  No Known Allergies  Chief Complaint  Patient presents with  . Medical Management of Chronic Issues    HPI:    Past Medical History:  Diagnosis Date  . Anemia    Due to fibroids.  . Anemia in CKD (chronic kidney disease) 12/14/2014  . Benign hypertension with ESRD (end-stage renal disease) ([place]) 02/12/2018  . Bilateral leg edema    a. Chronic.  Marland Kitchen Blind   . Cardiac arrest ([place]) 12/12/2014  . CHF (congestive heart failure) ([place])   . Coronary artery disease   . Deaf    USE SIGN INTERPRETER.  Divorced from husband but lives with him. Has daughter but she does not care for her.  . Diabetes mellitus    a. Per PCP note 2012 (A1C 9.2) - pt unwilling to take meds and was educated on risk of uncontrolled DM.  b. A1C 5.9 in 11/2013.  Marland Kitchen ESRD on hemodialysis ([place])    Mon, Wed, Fri  . Essential hypertension 08/09/2009   Qualifier: Diagnosis of  By: Sarita Haver  MD, Coralyn Mark    . Fibroid uterus   . Heart murmur   . Hyperlipidemia   . Hypertension    a. Has previously refused blood pressure meds.   . Leiomyoma of uterus 08/09/2009   Qualifier: Diagnosis of  By: Sarita Haver  MD, Coralyn Mark    . MI (myocardial infarction) ([place]) 11/2014  . Moderate tricuspid regurgitation   . Osteomyelitis ([place])   . Psychiatric disorder    She frequently exhibits paranoia and has been diagnosed with psychotic d/o NOS during hospital stay in the past. She is tangetial and perseverative during her visits. She apparently has had a bad experience with mental health in [place] in the past and refuses to discuss mental issues for fear that she will be sent back there. Her paranoia, communication issues, financial woes and lack of fam  . Pulmonary hypertension ([place])     Past Surgical History:  Procedure Laterality Date  . AMPUTATION Bilateral 04/02/2015   Procedure:  Amputation Bilateral Great Toes MTP Joint;  Surgeon: Newt Minion, MD;  Location: [facility];  Service: Orthopedics;  Laterality: Bilateral;  . AMPUTATION Left 05/14/2015   Procedure: Left Transmetatarsal Amputation;  Surgeon: Newt Minion, MD;  Location: [facility];  Service: Orthopedics;  Laterality: Left;  . AMPUTATION Right 08/20/2015   Procedure: Transmetatarsal Amputation Right Foot;  Surgeon: Newt Minion, MD;  Location: [place];  Service: Orthopedics;  Laterality: Right;  . AV FISTULA PLACEMENT Left 01/02/2014   Procedure: ARTERIOVENOUS (AV) FISTULA CREATION;  Surgeon: Rosetta Posner, MD;  Location: [facility];  Service: Vascular;  Laterality: Left;  . INSERTION OF DIALYSIS CATHETER Right 01/02/2014   Procedure: INSERTION OF DIALYSIS CATHETER;  Surgeon: Rosetta Posner, MD;  Location: [facility];  Service: Vascular;  Laterality: Right;  . NO PAST SURGERIES    . ORIF PATELLA Left 05/17/2015   Procedure: OPEN REDUCTION INTERNAL (ORIF) FIXATION PATELLA;  Surgeon: Newt Minion, MD;  Location: [facility];  Service: Orthopedics;  Laterality: Left;    Social History   Socioeconomic History  . Marital status: Widowed    Spouse name: Not on file  . Number of children: Not on file  . Years of education: Not on file  . Highest education level: Not on file  Occupational History  . Not on file  Social Needs  . Financial resource strain: Not on file  . Food insecurity:    Worry: Not on file    Inability: Not on file  . Transportation needs:    Medical: Not on file    Non-medical: Not on file  Tobacco Use  . Smoking status: Never Smoker  . Smokeless tobacco: Never Used  . Tobacco comment: Prior dip  Substance and Sexual Activity  . Alcohol use: No  . Drug use: No  . Sexual activity: Not on file  Lifestyle  . Physical activity:    Days per week: Not on file    Minutes per session: Not on file  . Stress: Not on file  Relationships  . Social connections:    Talks on phone: Not on file    Gets together: Not on  file    Attends religious service: Not on file    Active member of club or organization: Not on file    Attends meetings of clubs or organizations: Not on file    Relationship status: Not on file  . Intimate partner violence:    Fear of current or ex partner: Not on file    Emotionally abused: Not on file    Physically abused: Not on file    Forced sexual activity: Not on file  Other Topics Concern  . Not on file  Social History Narrative  . Not on file   Family History  Problem Relation Age of Onset  . Other Father        Drowned from fishing?  . Heart disease Unknown       VITAL SIGNS BP 116/78   Pulse 68   Temp 98 F (36.7 C)   Resp 18   Ht 5\' 5"  (1.651 m)   Wt 205 lb (93 kg)   SpO2 98%   BMI 34.11 kg/m   Outpatient Encounter Medications as of 06/06/2018  Medication Sig  . amiodarone (PACERONE) 200 MG tablet Take 200 mg by mouth every evening.   . ARIPiprazole (ABILIFY) 15 MG tablet Take 15 mg by mouth daily.   Marland Kitchen atorvastatin (LIPITOR) 10 MG tablet Take 10 mg by mouth every evening.  . calcium acetate (PHOSLO) 667 MG tablet Take 3 tablets by mouth before meals every Sunday, Tuesday, Thursday and Saturday for Dialysis.  Give 3 tablets by mouth two times a day every Mon, Wed, Friday for renal buffer  . clopidogrel (PLAVIX) 75 MG tablet Take 75 mg by mouth every evening.  . insulin aspart (NOVOLOG) 100 UNIT/ML injection Inject as per sliding scale subcutaneously after meals 0 - 70 = 0 units, notify MD is BS is less than 70 71 - 450 = 5 units. Notify MD if BS is greater than 450  . nitroGLYCERIN (NITROSTAT) 0.4 MG SL tablet Place 0.4 mg under the tongue every 5 (five) minutes as needed for chest pain.  . Nutritional Supplements (NUTRITIONAL SUPPLEMENT PO) Low Concentrated Sweets diet - Regular texture, Regular / thin consistency  . Nutritional Supplements (PROMOD) LIQD Take 60 mLs by mouth 2 (two) times daily.  . polyethylene glycol (MIRALAX / GLYCOLAX) packet Take 17  g by mouth daily.   . traMADol (ULTRAM) 50 MG tablet Take 1 tablet (50 mg total) by mouth daily.  . [DISCONTINUED] Amino Acids-Protein Hydrolys (FEEDING SUPPLEMENT, PRO-STAT SUGAR FREE 64,) LIQD Take 60 mLs by mouth 2 (two) times daily.   No facility-administered encounter medications on file as  of 06/06/2018.      SIGNIFICANT DIAGNOSTIC EXAMS  PREVIOUS  11-24-14: Myoview: 1. Fixed defects involving the apex, anterior lateral wall and the inferior wall. The apex and anterior lateral wall fixed defects aresuggestive for an infarct. No evidence for reversibility or ischemia. 2. Diffuse hypokinesia and minimal wall motion along the lateral wall. 3. Left ventricular ejection fraction is 35%. 4. High-risk stress test findings*.  04-16-15: 2-d echo: - Left ventricle: The cavity size was mildly dilated. Wall thickness was increased in a pattern of mild LVH. Systolic function was moderately to severely reduced. The estimated ejection fraction was in the range of 30% to 35%. There is akinesis of the anteroseptal and apical myocardium. Features are consistent with a pseudonormal left ventricular filling pattern, with concomitant abnormal relaxation and increased filling pressure (grade 2 diastolic dysfunction). Doppler parameters are consistent with high ventricular filling pressure. - Mitral valve: Calcified annulus. There was mild regurgitation. - Left atrium: The atrium was mildly dilated. - Pulmonary arteries: Systolic pressure was mildly increased.   NO NEW EXAMS   LABS REVIEWED: she will decline labs PREVIOUS     08-01-17: wbc 7.0; hgb 11.0; hct 33.3; mcv 97.1; plt 247; glucose 95; bun 64.2; creat 5.86; k+ 5.5; na++ 132; liver normal albumin 4.5 11-10-17: wbc 6.0; hgb 10.9; hct 34.7; mcv 88.8; plt 275; glucose 421; bun 25.3; creat 1.11; k+ 4.5; na++ 143; ca 8.6; liver normal albumin 3.1; tsh 3.07; hgb a1c 10.8; chol 170; ldl 101; trig 165; hdl 36  04-06-18: hgb a1c 5.5 04-30-18: wbc 8.2; hgb  11.5; hct 36.0; mcv 98.1; plt 257; glucose 177; bun 49; creat 8.30; k+ 5.6; na++ 134; ammonia 38; vit B 12: 401; tsh 4.139 chol 57; ldl 103; trig 165; hdl 50; RPR: nr 05-03-18: wbc 4.0; hgb 12.0; hct 37.4; mcv 96.1; plt 194; glucose 74; bun 60; creat 7.78; k+ 4,9; na++131; ca 8.7; phos 5.3; albumin 3.0    NO NEW LABS.    Review of Systems  Unable to perform ROS: Other (is deaf and blind )   Physical Exam  Constitutional: She appears well-developed and well-nourished. No distress.  Neck: No thyromegaly present.  Cardiovascular: Normal rate, regular rhythm and intact distal pulses.  Murmur heard. 1/6  Pulmonary/Chest: Effort normal and breath sounds normal. No respiratory distress.  Abdominal: Soft. Bowel sounds are normal. She exhibits no distension. There is no tenderness.  Musculoskeletal: She exhibits no edema.  Status post bilateral transmetatarsal amputations  Is able to move all extremities     Lymphadenopathy:    She has no cervical adenopathy.  Neurological: She is alert.  Skin: Skin is warm and dry. She is not diaphoretic.  Left upper arm a/v fistula: + thrill + bruitt       Psychiatric: She has a normal mood and affect.    ASSESSMENT/ PLAN:  TODAY:   1. PAD: is without change is status post left and right transmetatarsal amputation: will continue plavix 75 mg daily  Will continue ultram 50 mg daily for pain management   2. Chronic combined systolic and diastolic heart failure: has ischemic cardiomyopathy  EF is 30-35%  (04-16-15); is presently stable will continue to monitor   3. Sustained ventricular trachycardia: not a candidate for ICD. Is on amiodarone 200 mg daily for heart rate control will monitor   4. FTT in adult: is stable her current weight is 205 pounds; her albumin is 3.0  Will continue supplements per facility protocol.    PREVIOUS  5. 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     6. Chronic constipation: is stable will continue miralax daily  7. 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   8. Dyslipidemia associated with type 2 diabetes mellitus:  Without change; ldl 103; will continue lipitor 10 mg daily    9. ESRD on hemodialysis: is followed by nephrology. Will continue dialysis as scheduled. Will continue phoslo 667 mg 3 tabs three times daily on Sunday; Tuesday; Thursday; Saturday; give 2 tabs three times daily on Monday; Wednesday; Friday.    10. Anemia in CKD on chronic dialysis: stable  hgb 12.0 will monitor   11. Benign hypertension with ESRD and hypertensive heart disease with chronic combined heart failure : 111/72 is presently stable   MD is aware of resident's narcotic use and is in agreement with current plan of care. We will attempt to wean resident as apropriate   Ok Edwards NP [facility]  Contact [phone] Monday through Friday 8am- 5pm  After hours call [phone]

## 2018-06-07 DIAGNOSIS — N186 End stage renal disease: Secondary | ICD-10-CM | POA: Diagnosis not present

## 2018-06-07 DIAGNOSIS — Z992 Dependence on renal dialysis: Secondary | ICD-10-CM | POA: Diagnosis not present

## 2018-06-07 DIAGNOSIS — N2581 Secondary hyperparathyroidism of renal origin: Secondary | ICD-10-CM | POA: Diagnosis not present

## 2018-06-07 DIAGNOSIS — E1129 Type 2 diabetes mellitus with other diabetic kidney complication: Secondary | ICD-10-CM | POA: Diagnosis not present

## 2018-06-07 DIAGNOSIS — D631 Anemia in chronic kidney disease: Secondary | ICD-10-CM | POA: Diagnosis not present

## 2018-06-10 DIAGNOSIS — D631 Anemia in chronic kidney disease: Secondary | ICD-10-CM | POA: Diagnosis not present

## 2018-06-10 DIAGNOSIS — N186 End stage renal disease: Secondary | ICD-10-CM | POA: Diagnosis not present

## 2018-06-10 DIAGNOSIS — E1129 Type 2 diabetes mellitus with other diabetic kidney complication: Secondary | ICD-10-CM | POA: Diagnosis not present

## 2018-06-10 DIAGNOSIS — Z992 Dependence on renal dialysis: Secondary | ICD-10-CM | POA: Diagnosis not present

## 2018-06-10 DIAGNOSIS — N2581 Secondary hyperparathyroidism of renal origin: Secondary | ICD-10-CM | POA: Diagnosis not present

## 2018-06-12 DIAGNOSIS — N2581 Secondary hyperparathyroidism of renal origin: Secondary | ICD-10-CM | POA: Diagnosis not present

## 2018-06-12 DIAGNOSIS — Z992 Dependence on renal dialysis: Secondary | ICD-10-CM | POA: Diagnosis not present

## 2018-06-12 DIAGNOSIS — N186 End stage renal disease: Secondary | ICD-10-CM | POA: Diagnosis not present

## 2018-06-12 DIAGNOSIS — E1129 Type 2 diabetes mellitus with other diabetic kidney complication: Secondary | ICD-10-CM | POA: Diagnosis not present

## 2018-06-12 DIAGNOSIS — D631 Anemia in chronic kidney disease: Secondary | ICD-10-CM | POA: Diagnosis not present

## 2018-06-14 DIAGNOSIS — D631 Anemia in chronic kidney disease: Secondary | ICD-10-CM | POA: Diagnosis not present

## 2018-06-14 DIAGNOSIS — Z992 Dependence on renal dialysis: Secondary | ICD-10-CM | POA: Diagnosis not present

## 2018-06-14 DIAGNOSIS — N2581 Secondary hyperparathyroidism of renal origin: Secondary | ICD-10-CM | POA: Diagnosis not present

## 2018-06-14 DIAGNOSIS — N186 End stage renal disease: Secondary | ICD-10-CM | POA: Diagnosis not present

## 2018-06-14 DIAGNOSIS — E1129 Type 2 diabetes mellitus with other diabetic kidney complication: Secondary | ICD-10-CM | POA: Diagnosis not present

## 2018-06-17 DIAGNOSIS — D631 Anemia in chronic kidney disease: Secondary | ICD-10-CM | POA: Diagnosis not present

## 2018-06-17 DIAGNOSIS — Z992 Dependence on renal dialysis: Secondary | ICD-10-CM | POA: Diagnosis not present

## 2018-06-17 DIAGNOSIS — E1129 Type 2 diabetes mellitus with other diabetic kidney complication: Secondary | ICD-10-CM | POA: Diagnosis not present

## 2018-06-17 DIAGNOSIS — N2581 Secondary hyperparathyroidism of renal origin: Secondary | ICD-10-CM | POA: Diagnosis not present

## 2018-06-17 DIAGNOSIS — N186 End stage renal disease: Secondary | ICD-10-CM | POA: Diagnosis not present

## 2018-06-19 DIAGNOSIS — E1129 Type 2 diabetes mellitus with other diabetic kidney complication: Secondary | ICD-10-CM | POA: Diagnosis not present

## 2018-06-19 DIAGNOSIS — N186 End stage renal disease: Secondary | ICD-10-CM | POA: Diagnosis not present

## 2018-06-19 DIAGNOSIS — D631 Anemia in chronic kidney disease: Secondary | ICD-10-CM | POA: Diagnosis not present

## 2018-06-19 DIAGNOSIS — Z992 Dependence on renal dialysis: Secondary | ICD-10-CM | POA: Diagnosis not present

## 2018-06-19 DIAGNOSIS — N2581 Secondary hyperparathyroidism of renal origin: Secondary | ICD-10-CM | POA: Diagnosis not present

## 2018-06-21 DIAGNOSIS — N2581 Secondary hyperparathyroidism of renal origin: Secondary | ICD-10-CM | POA: Diagnosis not present

## 2018-06-21 DIAGNOSIS — Z992 Dependence on renal dialysis: Secondary | ICD-10-CM | POA: Diagnosis not present

## 2018-06-21 DIAGNOSIS — D631 Anemia in chronic kidney disease: Secondary | ICD-10-CM | POA: Diagnosis not present

## 2018-06-21 DIAGNOSIS — E1129 Type 2 diabetes mellitus with other diabetic kidney complication: Secondary | ICD-10-CM | POA: Diagnosis not present

## 2018-06-21 DIAGNOSIS — N186 End stage renal disease: Secondary | ICD-10-CM | POA: Diagnosis not present

## 2018-06-24 DIAGNOSIS — Z992 Dependence on renal dialysis: Secondary | ICD-10-CM | POA: Diagnosis not present

## 2018-06-24 DIAGNOSIS — E1129 Type 2 diabetes mellitus with other diabetic kidney complication: Secondary | ICD-10-CM | POA: Diagnosis not present

## 2018-06-24 DIAGNOSIS — D631 Anemia in chronic kidney disease: Secondary | ICD-10-CM | POA: Diagnosis not present

## 2018-06-24 DIAGNOSIS — N186 End stage renal disease: Secondary | ICD-10-CM | POA: Diagnosis not present

## 2018-06-24 DIAGNOSIS — N2581 Secondary hyperparathyroidism of renal origin: Secondary | ICD-10-CM | POA: Diagnosis not present

## 2018-06-26 DIAGNOSIS — E1129 Type 2 diabetes mellitus with other diabetic kidney complication: Secondary | ICD-10-CM | POA: Diagnosis not present

## 2018-06-26 DIAGNOSIS — N186 End stage renal disease: Secondary | ICD-10-CM | POA: Diagnosis not present

## 2018-06-26 DIAGNOSIS — D631 Anemia in chronic kidney disease: Secondary | ICD-10-CM | POA: Diagnosis not present

## 2018-06-26 DIAGNOSIS — Z992 Dependence on renal dialysis: Secondary | ICD-10-CM | POA: Diagnosis not present

## 2018-06-26 DIAGNOSIS — N2581 Secondary hyperparathyroidism of renal origin: Secondary | ICD-10-CM | POA: Diagnosis not present

## 2018-06-28 DIAGNOSIS — D631 Anemia in chronic kidney disease: Secondary | ICD-10-CM | POA: Diagnosis not present

## 2018-06-28 DIAGNOSIS — N186 End stage renal disease: Secondary | ICD-10-CM | POA: Diagnosis not present

## 2018-06-28 DIAGNOSIS — Z992 Dependence on renal dialysis: Secondary | ICD-10-CM | POA: Diagnosis not present

## 2018-06-28 DIAGNOSIS — N2581 Secondary hyperparathyroidism of renal origin: Secondary | ICD-10-CM | POA: Diagnosis not present

## 2018-06-28 DIAGNOSIS — E1129 Type 2 diabetes mellitus with other diabetic kidney complication: Secondary | ICD-10-CM | POA: Diagnosis not present

## 2018-07-01 ENCOUNTER — Other Ambulatory Visit: Payer: Self-pay

## 2018-07-01 DIAGNOSIS — E1129 Type 2 diabetes mellitus with other diabetic kidney complication: Secondary | ICD-10-CM | POA: Diagnosis not present

## 2018-07-01 DIAGNOSIS — N2581 Secondary hyperparathyroidism of renal origin: Secondary | ICD-10-CM | POA: Diagnosis not present

## 2018-07-01 DIAGNOSIS — D631 Anemia in chronic kidney disease: Secondary | ICD-10-CM | POA: Diagnosis not present

## 2018-07-01 DIAGNOSIS — Z992 Dependence on renal dialysis: Secondary | ICD-10-CM | POA: Diagnosis not present

## 2018-07-01 DIAGNOSIS — N186 End stage renal disease: Secondary | ICD-10-CM | POA: Diagnosis not present

## 2018-07-01 MED ORDER — TRAMADOL HCL 50 MG PO TABS: 50.0000 mg | ORAL_TABLET | Freq: Every day | ORAL | 0 refills | Status: DC

## 2018-07-01 NOTE — Telephone Encounter (Signed)
Rx faxed to [facility] (P) [phone], (F) [phone] 

## 2018-07-03 DIAGNOSIS — Z992 Dependence on renal dialysis: Secondary | ICD-10-CM | POA: Diagnosis not present

## 2018-07-03 DIAGNOSIS — E1129 Type 2 diabetes mellitus with other diabetic kidney complication: Secondary | ICD-10-CM | POA: Diagnosis not present

## 2018-07-03 DIAGNOSIS — N2581 Secondary hyperparathyroidism of renal origin: Secondary | ICD-10-CM | POA: Diagnosis not present

## 2018-07-03 DIAGNOSIS — N186 End stage renal disease: Secondary | ICD-10-CM | POA: Diagnosis not present

## 2018-07-03 DIAGNOSIS — D631 Anemia in chronic kidney disease: Secondary | ICD-10-CM | POA: Diagnosis not present

## 2018-07-03 LAB — TSH: TSH: 2.33 (ref 0.41–5.90)

## 2018-07-04 ENCOUNTER — Non-Acute Institutional Stay (SKILLED_NURSING_FACILITY): Payer: Medicare Other | Admitting: Adult Health

## 2018-07-04 ENCOUNTER — Encounter: Payer: Self-pay | Admitting: Adult Health

## 2018-07-04 ENCOUNTER — Other Ambulatory Visit: Payer: Self-pay

## 2018-07-04 DIAGNOSIS — I120 Hypertensive chronic kidney disease with stage 5 chronic kidney disease or end stage renal disease: Secondary | ICD-10-CM | POA: Diagnosis not present

## 2018-07-04 DIAGNOSIS — D631 Anemia in chronic kidney disease: Secondary | ICD-10-CM | POA: Diagnosis not present

## 2018-07-04 DIAGNOSIS — Z992 Dependence on renal dialysis: Secondary | ICD-10-CM

## 2018-07-04 DIAGNOSIS — I5042 Chronic combined systolic (congestive) and diastolic (congestive) heart failure: Secondary | ICD-10-CM

## 2018-07-04 DIAGNOSIS — I739 Peripheral vascular disease, unspecified: Secondary | ICD-10-CM | POA: Diagnosis not present

## 2018-07-04 DIAGNOSIS — N186 End stage renal disease: Secondary | ICD-10-CM

## 2018-07-04 DIAGNOSIS — I110 Hypertensive heart disease with heart failure: Secondary | ICD-10-CM | POA: Diagnosis not present

## 2018-07-04 MED ORDER — TRAMADOL HCL 50 MG PO TABS: 50.0000 mg | ORAL_TABLET | Freq: Every day | ORAL | 0 refills | Status: DC

## 2018-07-04 NOTE — Progress Notes (Signed)
Location:   [facility] Room Number: 202 A Place of Service:  SNF (31)   CODE STATUS: Full code (Most form updated 06/07/15)  No Known Allergies  Chief Complaint  Patient presents with  . Medical Management of Chronic Issues    Hypertension; esrd; anemia; pad.     HPI:  She is a 62 year old [place] term resident of this facility being seen for the management of her chronic illnesses: hypertension; esrd; anemia; pad. She is unable to participate in the hpi or ros. There are no reports of uncontrolled pain; no changes in appetite; no behavioral issues. There are no nursing concerns at this time.   Past Medical History:  Diagnosis Date  . Anemia    Due to fibroids.  . Anemia in CKD (chronic kidney disease) 12/14/2014  . Benign hypertension with ESRD (end-stage renal disease) ([place]) 02/12/2018  . Bilateral leg edema    a. Chronic.  Marland Kitchen Blind   . Cardiac arrest ([place]) 12/12/2014  . CHF (congestive heart failure) ([place])   . Coronary artery disease   . Deaf    USE SIGN INTERPRETER.  Divorced from husband but lives with him. Has daughter but she does not care for her.  . Diabetes mellitus    a. Per PCP note 2012 (A1C 9.2) - pt unwilling to take meds and was educated on risk of uncontrolled DM.  b. A1C 5.9 in 11/2013.  Marland Kitchen ESRD on hemodialysis ([place])    Mon, Wed, Fri  . Essential hypertension 08/09/2009   Qualifier: Diagnosis of  By: Sarita Haver  MD, Coralyn Mark    . Fibroid uterus   . Heart murmur   . Hyperlipidemia   . Hypertension    a. Has previously refused blood pressure meds.   . Leiomyoma of uterus 08/09/2009   Qualifier: Diagnosis of  By: Sarita Haver  MD, Coralyn Mark    . MI (myocardial infarction) ([place]) 11/2014  . Moderate tricuspid regurgitation   . Osteomyelitis ([place])   . Psychiatric disorder    She frequently exhibits paranoia and has been diagnosed with psychotic d/o NOS during hospital stay in the past. She is tangetial and perseverative during her visits. She apparently has  had a bad experience with mental health in [place] in the past and refuses to discuss mental issues for fear that she will be sent back there. Her paranoia, communication issues, financial woes and lack of fam  . Pulmonary hypertension ([place])     Past Surgical History:  Procedure Laterality Date  . AMPUTATION Bilateral 04/02/2015   Procedure: Amputation Bilateral Great Toes MTP Joint;  Surgeon: Newt Minion, MD;  Location: [place];  Service: Orthopedics;  Laterality: Bilateral;  . AMPUTATION Left 05/14/2015   Procedure: Left Transmetatarsal Amputation;  Surgeon: Newt Minion, MD;  Location: [place];  Service: Orthopedics;  Laterality: Left;  . AMPUTATION Right 08/20/2015   Procedure: Transmetatarsal Amputation Right Foot;  Surgeon: Newt Minion, MD;  Location: [place];  Service: Orthopedics;  Laterality: Right;  . AV FISTULA PLACEMENT Left 01/02/2014   Procedure: ARTERIOVENOUS (AV) FISTULA CREATION;  Surgeon: Rosetta Posner, MD;  Location: [place];  Service: Vascular;  Laterality: Left;  . INSERTION OF DIALYSIS CATHETER Right 01/02/2014   Procedure: INSERTION OF DIALYSIS CATHETER;  Surgeon: Rosetta Posner, MD;  Location: [place];  Service: Vascular;  Laterality: Right;  . NO PAST SURGERIES    . ORIF PATELLA Left 05/17/2015   Procedure: OPEN REDUCTION INTERNAL (ORIF) FIXATION PATELLA;  Surgeon: Newt Minion, MD;  Location: [place];  Service: Orthopedics;  Laterality: Left;    Social History   Socioeconomic History  . Marital status: Widowed    Spouse name: Not on file  . Number of children: Not on file  . Years of education: Not on file  . Highest education level: Not on file  Occupational History  . Not on file  Social Needs  . Financial resource strain: Not on file  . Food insecurity:    Worry: Not on file    Inability: Not on file  . Transportation needs:    Medical: Not on file    Non-medical: Not on file  Tobacco Use  . Smoking status: Never Smoker  . Smokeless tobacco: Never Used  . Tobacco  comment: Prior dip  Substance and Sexual Activity  . Alcohol use: No  . Drug use: No  . Sexual activity: Not on file  Lifestyle  . Physical activity:    Days per week: Not on file    Minutes per session: Not on file  . Stress: Not on file  Relationships  . Social connections:    Talks on phone: Not on file    Gets together: Not on file    Attends religious service: Not on file    Active member of club or organization: Not on file    Attends meetings of clubs or organizations: Not on file    Relationship status: Not on file  . Intimate partner violence:    Fear of current or ex partner: Not on file    Emotionally abused: Not on file    Physically abused: Not on file    Forced sexual activity: Not on file  Other Topics Concern  . Not on file  Social History Narrative  . Not on file   Family History  Problem Relation Age of Onset  . Other Father        Drowned from fishing?  . Heart disease Unknown       VITAL SIGNS BP 128/70   Pulse 68   Temp 98 F (36.7 C)   Resp 18   Ht 5\' 5"  (1.651 m)   Wt 148 lb (67.1 kg)   SpO2 97%   BMI 24.63 kg/m   Outpatient Encounter Medications as of 07/04/2018  Medication Sig  . amiodarone (PACERONE) 200 MG tablet Take 200 mg by mouth every evening.   . ARIPiprazole (ABILIFY) 15 MG tablet Take 15 mg by mouth daily.   Marland Kitchen atorvastatin (LIPITOR) 10 MG tablet Take 10 mg by mouth every evening.  . calcium acetate (PHOSLO) 667 MG tablet Take 3 tablets by mouth before meals every Sunday, Tuesday, Thursday and Saturday for Dialysis.  Give 3 tablets by mouth two times a day every Mon, Wed, Friday for renal buffer  . clopidogrel (PLAVIX) 75 MG tablet Take 75 mg by mouth every evening.  . insulin aspart (NOVOLOG) 100 UNIT/ML injection Inject as per sliding scale subcutaneously after meals 0 - 70 = 0 units, notify MD is BS is less than 70 71 - 450 = 5 units. Notify MD if BS is greater than 450  . nitroGLYCERIN (NITROSTAT) 0.4 MG SL tablet Place  0.4 mg under the tongue every 5 (five) minutes as needed for chest pain.  . Nutritional Supplements (NUTRITIONAL SUPPLEMENT PO) Low Concentrated Sweets diet - Regular texture, Regular / thin consistency  . Nutritional Supplements (PROMOD) LIQD Take 60 mLs by mouth 2 (two) times daily.  Marland Kitchen  polyethylene glycol (MIRALAX / GLYCOLAX) packet Take 17 g by mouth daily.   . sevelamer (RENAGEL) 800 MG tablet Give 3 tablets by mouth before meals  . traMADol (ULTRAM) 50 MG tablet Take 1 tablet (50 mg total) by mouth daily.   No facility-administered encounter medications on file as of 07/04/2018.      SIGNIFICANT DIAGNOSTIC EXAMS  PREVIOUS  11-24-14: Myoview: 1. Fixed defects involving the apex, anterior lateral wall and the inferior wall. The apex and anterior lateral wall fixed defects aresuggestive for an infarct. No evidence for reversibility or ischemia. 2. Diffuse hypokinesia and minimal wall motion along the lateral wall. 3. Left ventricular ejection fraction is 35%. 4. High-risk stress test findings*.  04-16-15: 2-d echo: - Left ventricle: The cavity size was mildly dilated. Wall thickness was increased in a pattern of mild LVH. Systolic function was moderately to severely reduced. The estimated ejection fraction was in the range of 30% to 35%. There is akinesis of the anteroseptal and apical myocardium. Features are consistent with a pseudonormal left ventricular filling pattern, with concomitant abnormal relaxation and increased filling pressure (grade 2 diastolic dysfunction). Doppler parameters are consistent with high ventricular filling pressure. - Mitral valve: Calcified annulus. There was mild regurgitation. - Left atrium: The atrium was mildly dilated. - Pulmonary arteries: Systolic pressure was mildly increased.   NO NEW EXAMS   LABS REVIEWED: she will decline labs PREVIOUS     08-01-17: wbc 7.0; hgb 11.0; hct 33.3; mcv 97.1; plt 247; glucose 95; bun 64.2; creat 5.86; k+ 5.5; na++  132; liver normal albumin 4.5 11-10-17: wbc 6.0; hgb 10.9; hct 34.7; mcv 88.8; plt 275; glucose 421; bun 25.3; creat 1.11; k+ 4.5; na++ 143; ca 8.6; liver normal albumin 3.1; tsh 3.07; hgb a1c 10.8; chol 170; ldl 101; trig 165; hdl 36  04-06-18: hgb a1c 5.5 04-30-18: wbc 8.2; hgb 11.5; hct 36.0; mcv 98.1; plt 257; glucose 177; bun 49; creat 8.30; k+ 5.6; na++ 134; ammonia 38; vit B 12: 401; tsh 4.13-19 chol 57; ldl 103; trig 165; hdl 50; RPR: nr 05-03-18: wbc 4.0; hgb 12.0; hct 37.4; mcv 96.1; plt 194; glucose 74; bun 60; creat 7.78; k+ 4,9; na++131; ca 8.7; phos 5.3; albumin 3.0    TODAY:   07-03-18: tsh 2.33   Review of Systems  Unable to perform ROS: Other (deaf and blind )    Physical Exam  Constitutional: She appears well-developed and well-nourished. No distress.  Neck: No thyromegaly present.  Cardiovascular: Normal rate, regular rhythm and intact distal pulses.  Murmur heard. 1/6  Pulmonary/Chest: Effort normal and breath sounds normal. No respiratory distress.  Abdominal: Soft. Bowel sounds are normal. She exhibits no distension. There is no tenderness.  Musculoskeletal: Normal range of motion. She exhibits no edema.  Status post bilateral transmetatarsal amputations  Is able to move all extremities       Lymphadenopathy:    She has no cervical adenopathy.  Neurological: She is alert.  Skin: Skin is warm and dry. She is not diaphoretic.  Left upper arm a/v fistula: + thrill + bruitt         Psychiatric: She has a normal mood and affect.     ASSESSMENT/ PLAN:  TODAY:   1. ESRD on hemodialysis: is followed by nephrology. Will continue dialysis as scheduled. Will continue phoslo 667 mg 3 tabs three times daily on Sunday; Tuesday; Thursday; Saturday; give 2 tabs three times daily on Monday; Wednesday; Friday.    2. Anemia in CKD  on chronic dialysis: stable  hgb 12.0 will monitor   3. Benign hypertension with ESRD and hypertensive heart disease with chronic combined heart  failure : b/p 128/70 is presently stable   4. PAD: is without change is status post left and right transmetatarsal amputation: will continue plavix 75 mg daily  Will continue ultram 50 mg daily for pain management    PREVIOUS   5. Chronic combined systolic and diastolic heart failure: has ischemic cardiomyopathy  EF is 30-35%  (04-16-15); is presently stable will continue to monitor   6. Sustained ventricular trachycardia: not a candidate for ICD. Is on amiodarone 200 mg daily for heart rate control will monitor   7. 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   8. Chronic constipation: is stable will continue miralax daily  9. 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   10. Dyslipidemia associated with type 2 diabetes mellitus:  Without change; ldl 103; will continue lipitor 10 mg daily     MD is aware of resident's narcotic use and is in agreement with current plan of care. We will attempt to wean resident as apropriate   Ok Edwards NP [facility]  Contact [phone] Monday through Friday 8am- 5pm  After hours call [phone]

## 2018-07-04 NOTE — Telephone Encounter (Signed)
Rx faxed to [facility] (P) [phone], (F) [phone] 

## 2018-07-05 DIAGNOSIS — N186 End stage renal disease: Secondary | ICD-10-CM | POA: Diagnosis not present

## 2018-07-05 DIAGNOSIS — E1129 Type 2 diabetes mellitus with other diabetic kidney complication: Secondary | ICD-10-CM | POA: Diagnosis not present

## 2018-07-05 DIAGNOSIS — Z992 Dependence on renal dialysis: Secondary | ICD-10-CM | POA: Diagnosis not present

## 2018-07-05 DIAGNOSIS — N2581 Secondary hyperparathyroidism of renal origin: Secondary | ICD-10-CM | POA: Diagnosis not present

## 2018-07-05 DIAGNOSIS — D631 Anemia in chronic kidney disease: Secondary | ICD-10-CM | POA: Diagnosis not present

## 2018-07-08 DIAGNOSIS — Z992 Dependence on renal dialysis: Secondary | ICD-10-CM | POA: Diagnosis not present

## 2018-07-08 DIAGNOSIS — N186 End stage renal disease: Secondary | ICD-10-CM | POA: Diagnosis not present

## 2018-07-08 DIAGNOSIS — D631 Anemia in chronic kidney disease: Secondary | ICD-10-CM | POA: Diagnosis not present

## 2018-07-08 DIAGNOSIS — E1129 Type 2 diabetes mellitus with other diabetic kidney complication: Secondary | ICD-10-CM | POA: Diagnosis not present

## 2018-07-08 DIAGNOSIS — N2581 Secondary hyperparathyroidism of renal origin: Secondary | ICD-10-CM | POA: Diagnosis not present

## 2018-07-10 DIAGNOSIS — Z992 Dependence on renal dialysis: Secondary | ICD-10-CM | POA: Diagnosis not present

## 2018-07-10 DIAGNOSIS — N2581 Secondary hyperparathyroidism of renal origin: Secondary | ICD-10-CM | POA: Diagnosis not present

## 2018-07-10 DIAGNOSIS — N186 End stage renal disease: Secondary | ICD-10-CM | POA: Diagnosis not present

## 2018-07-10 DIAGNOSIS — D631 Anemia in chronic kidney disease: Secondary | ICD-10-CM | POA: Diagnosis not present

## 2018-07-10 DIAGNOSIS — E1129 Type 2 diabetes mellitus with other diabetic kidney complication: Secondary | ICD-10-CM | POA: Diagnosis not present

## 2018-07-11 DIAGNOSIS — F200 Paranoid schizophrenia: Secondary | ICD-10-CM | POA: Diagnosis not present

## 2018-07-12 DIAGNOSIS — D631 Anemia in chronic kidney disease: Secondary | ICD-10-CM | POA: Diagnosis not present

## 2018-07-12 DIAGNOSIS — N186 End stage renal disease: Secondary | ICD-10-CM | POA: Diagnosis not present

## 2018-07-12 DIAGNOSIS — E1129 Type 2 diabetes mellitus with other diabetic kidney complication: Secondary | ICD-10-CM | POA: Diagnosis not present

## 2018-07-12 DIAGNOSIS — N2581 Secondary hyperparathyroidism of renal origin: Secondary | ICD-10-CM | POA: Diagnosis not present

## 2018-07-12 DIAGNOSIS — Z992 Dependence on renal dialysis: Secondary | ICD-10-CM | POA: Diagnosis not present

## 2018-07-15 DIAGNOSIS — E1129 Type 2 diabetes mellitus with other diabetic kidney complication: Secondary | ICD-10-CM | POA: Diagnosis not present

## 2018-07-15 DIAGNOSIS — Z992 Dependence on renal dialysis: Secondary | ICD-10-CM | POA: Diagnosis not present

## 2018-07-15 DIAGNOSIS — N2581 Secondary hyperparathyroidism of renal origin: Secondary | ICD-10-CM | POA: Diagnosis not present

## 2018-07-15 DIAGNOSIS — N186 End stage renal disease: Secondary | ICD-10-CM | POA: Diagnosis not present

## 2018-07-15 DIAGNOSIS — D631 Anemia in chronic kidney disease: Secondary | ICD-10-CM | POA: Diagnosis not present

## 2018-07-17 DIAGNOSIS — N2581 Secondary hyperparathyroidism of renal origin: Secondary | ICD-10-CM | POA: Diagnosis not present

## 2018-07-17 DIAGNOSIS — Z992 Dependence on renal dialysis: Secondary | ICD-10-CM | POA: Diagnosis not present

## 2018-07-17 DIAGNOSIS — N186 End stage renal disease: Secondary | ICD-10-CM | POA: Diagnosis not present

## 2018-07-17 DIAGNOSIS — D631 Anemia in chronic kidney disease: Secondary | ICD-10-CM | POA: Diagnosis not present

## 2018-07-17 DIAGNOSIS — E1129 Type 2 diabetes mellitus with other diabetic kidney complication: Secondary | ICD-10-CM | POA: Diagnosis not present

## 2018-07-19 DIAGNOSIS — Z992 Dependence on renal dialysis: Secondary | ICD-10-CM | POA: Diagnosis not present

## 2018-07-19 DIAGNOSIS — D631 Anemia in chronic kidney disease: Secondary | ICD-10-CM | POA: Diagnosis not present

## 2018-07-19 DIAGNOSIS — N186 End stage renal disease: Secondary | ICD-10-CM | POA: Diagnosis not present

## 2018-07-19 DIAGNOSIS — N2581 Secondary hyperparathyroidism of renal origin: Secondary | ICD-10-CM | POA: Diagnosis not present

## 2018-07-19 DIAGNOSIS — E1129 Type 2 diabetes mellitus with other diabetic kidney complication: Secondary | ICD-10-CM | POA: Diagnosis not present

## 2018-07-22 DIAGNOSIS — N2581 Secondary hyperparathyroidism of renal origin: Secondary | ICD-10-CM | POA: Diagnosis not present

## 2018-07-22 DIAGNOSIS — E1129 Type 2 diabetes mellitus with other diabetic kidney complication: Secondary | ICD-10-CM | POA: Diagnosis not present

## 2018-07-22 DIAGNOSIS — D631 Anemia in chronic kidney disease: Secondary | ICD-10-CM | POA: Diagnosis not present

## 2018-07-22 DIAGNOSIS — N186 End stage renal disease: Secondary | ICD-10-CM | POA: Diagnosis not present

## 2018-07-22 DIAGNOSIS — Z992 Dependence on renal dialysis: Secondary | ICD-10-CM | POA: Diagnosis not present

## 2018-07-24 DIAGNOSIS — N186 End stage renal disease: Secondary | ICD-10-CM | POA: Diagnosis not present

## 2018-07-24 DIAGNOSIS — E1129 Type 2 diabetes mellitus with other diabetic kidney complication: Secondary | ICD-10-CM | POA: Diagnosis not present

## 2018-07-24 DIAGNOSIS — N2581 Secondary hyperparathyroidism of renal origin: Secondary | ICD-10-CM | POA: Diagnosis not present

## 2018-07-24 DIAGNOSIS — D631 Anemia in chronic kidney disease: Secondary | ICD-10-CM | POA: Diagnosis not present

## 2018-07-24 DIAGNOSIS — Z992 Dependence on renal dialysis: Secondary | ICD-10-CM | POA: Diagnosis not present

## 2018-07-25 DIAGNOSIS — N186 End stage renal disease: Secondary | ICD-10-CM | POA: Diagnosis not present

## 2018-07-25 DIAGNOSIS — Z992 Dependence on renal dialysis: Secondary | ICD-10-CM | POA: Diagnosis not present

## 2018-07-25 DIAGNOSIS — E1129 Type 2 diabetes mellitus with other diabetic kidney complication: Secondary | ICD-10-CM | POA: Diagnosis not present

## 2018-07-26 DIAGNOSIS — D631 Anemia in chronic kidney disease: Secondary | ICD-10-CM | POA: Diagnosis not present

## 2018-07-26 DIAGNOSIS — N186 End stage renal disease: Secondary | ICD-10-CM | POA: Diagnosis not present

## 2018-07-26 DIAGNOSIS — Z992 Dependence on renal dialysis: Secondary | ICD-10-CM | POA: Diagnosis not present

## 2018-07-26 DIAGNOSIS — E1129 Type 2 diabetes mellitus with other diabetic kidney complication: Secondary | ICD-10-CM | POA: Diagnosis not present

## 2018-07-26 DIAGNOSIS — N2581 Secondary hyperparathyroidism of renal origin: Secondary | ICD-10-CM | POA: Diagnosis not present

## 2018-07-29 ENCOUNTER — Non-Acute Institutional Stay (SKILLED_NURSING_FACILITY): Payer: Medicare Other | Admitting: Internal Medicine

## 2018-07-29 ENCOUNTER — Encounter: Payer: Self-pay | Admitting: Internal Medicine

## 2018-07-29 DIAGNOSIS — F22 Delusional disorders: Secondary | ICD-10-CM | POA: Diagnosis not present

## 2018-07-29 DIAGNOSIS — Z992 Dependence on renal dialysis: Secondary | ICD-10-CM | POA: Diagnosis not present

## 2018-07-29 DIAGNOSIS — I739 Peripheral vascular disease, unspecified: Secondary | ICD-10-CM

## 2018-07-29 DIAGNOSIS — F4324 Adjustment disorder with disturbance of conduct: Secondary | ICD-10-CM | POA: Diagnosis not present

## 2018-07-29 DIAGNOSIS — N186 End stage renal disease: Secondary | ICD-10-CM

## 2018-07-29 DIAGNOSIS — N2581 Secondary hyperparathyroidism of renal origin: Secondary | ICD-10-CM | POA: Diagnosis not present

## 2018-07-29 DIAGNOSIS — D631 Anemia in chronic kidney disease: Secondary | ICD-10-CM | POA: Diagnosis not present

## 2018-07-29 DIAGNOSIS — E1122 Type 2 diabetes mellitus with diabetic chronic kidney disease: Secondary | ICD-10-CM | POA: Diagnosis not present

## 2018-07-29 DIAGNOSIS — I5042 Chronic combined systolic (congestive) and diastolic (congestive) heart failure: Secondary | ICD-10-CM

## 2018-07-29 DIAGNOSIS — E1129 Type 2 diabetes mellitus with other diabetic kidney complication: Secondary | ICD-10-CM | POA: Diagnosis not present

## 2018-07-29 NOTE — Progress Notes (Signed)
Patient ID: [name], female   DOB: December 22, 1956, 62 y.o.   MRN: [id number]  Location:  [facility] Room Number: 202 A Place of Service:  SNF (31) Provider:  Dodge, Evans, DO  Patient Care Team: [name], DO as PCP - General (Internal Medicine) Gerlene Fee, NP as Nurse Practitioner ([place]) [facility], [place] ([place])  Extended Emergency Contact Information Primary Emergency Contact: [place] Phone: [phone] Relation: Son Secondary Emergency Contact: Oliver [place] Phone: [phone] Mobile Phone: [phone] Relation: Daughter  Code Status:  Full Code Goals of care: Advanced Directive information Advanced Directives 07/29/2018  Does Patient Have a Medical Advance Directive? Yes  Type of Advance Directive Out of facility DNR (pink MOST or yellow form)  Does patient want to make changes to medical advance directive? No - Patient declined  Copy of [place] in Chart? -  Would patient like information on creating a medical advance directive? -  Pre-existing out of facility DNR order (yellow form or pink MOST form) Pink MOST form placed in chart (order not valid for inpatient use)     Chief Complaint  Patient presents with  . Medical Management of Chronic Issues        HPI:  Pt is a 62 y.o. female seen today for medical management of chronic diseases.  She indicated that she does not want to go to HD today and prefers to go home. No other concerns. Appetite is good; sleeps well. No nursing issues. She is a poor historian due to dementia. Hx obtained from chart.   ESRD - on HD. Takes phoslo 667 mg 3 tabs three times daily on Sunday; Tuesday; Thursday; Saturday; give 2 tabs three times daily on Monday; Wednesday; Friday. Followed by nephrology   Anemia in CKD - on chronic dialysis: stable. Hgb  12  HTN -  stable on HD  PAD - s/p b/l TMA; takes plavix 75 mg daily for anticoagulation. 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   Chronic constipation - stable on miralax daily  DM - controlled. A1c 5.5%; takes novolog 5 units after meals; she has associated ESRD and PAD; on statin  Dyslipidemia - stable on lipitor 10 mg daily; LDL 103      Past Medical History:  Diagnosis Date  . Anemia    Due to fibroids.  . Anemia in CKD (chronic kidney disease) 12/14/2014  . Benign hypertension with ESRD (end-stage renal disease) ([place]) 02/12/2018  . Bilateral leg edema    a. Chronic.  Marland Kitchen Blind   . Cardiac arrest ([place]) 12/12/2014  . CHF (congestive heart failure) ([place])   . Coronary artery disease   . Deaf    USE SIGN INTERPRETER.  Divorced from husband but lives with him. Has daughter but she does not care for her.  . Diabetes mellitus    a. Per PCP note 2012 (A1C 9.2) - pt unwilling to take meds and was educated on risk of uncontrolled DM.  b. A1C 5.9 in 11/2013.  Marland Kitchen ESRD  on hemodialysis ([place])    Mon, Wed, Fri  . Essential hypertension 08/09/2009   Qualifier: Diagnosis of  By: Sarita Haver  MD, Coralyn Mark    . Fibroid uterus   . Heart murmur   . Hyperlipidemia   . Hypertension    a. Has previously refused blood pressure meds.   . Leiomyoma of uterus 08/09/2009   Qualifier: Diagnosis of  By: Sarita Haver  MD, Coralyn Mark    . MI (myocardial infarction) ([place]) 11/2014  . Moderate tricuspid regurgitation   . Osteomyelitis ([place])   . Psychiatric disorder    She frequently exhibits paranoia and has been diagnosed with psychotic d/o NOS during hospital stay  in the past. She is tangetial and perseverative during her visits. She apparently has had a bad experience with mental health in [place] in the past and refuses to discuss mental issues for fear that she will be sent back there. Her paranoia, communication issues, financial woes and lack of fam  . Pulmonary hypertension ([place])    Past Surgical History:  Procedure Laterality Date  . AMPUTATION Bilateral 04/02/2015   Procedure: Amputation Bilateral Great Toes MTP Joint;  Surgeon: Newt Minion, MD;  Location: [place];  Service: Orthopedics;  Laterality: Bilateral;  . AMPUTATION Left 05/14/2015   Procedure: Left Transmetatarsal Amputation;  Surgeon: Newt Minion, MD;  Location: [facility];  Service: Orthopedics;  Laterality: Left;  . AMPUTATION Right 08/20/2015   Procedure: Transmetatarsal Amputation Right Foot;  Surgeon: Newt Minion, MD;  Location: [facility];  Service: Orthopedics;  Laterality: Right;  . AV FISTULA PLACEMENT Left 01/02/2014   Procedure: ARTERIOVENOUS (AV) FISTULA CREATION;  Surgeon: Rosetta Posner, MD;  Location: [facility];  Service: Vascular;  Laterality: Left;  . INSERTION OF DIALYSIS CATHETER Right 01/02/2014   Procedure: INSERTION OF DIALYSIS CATHETER;  Surgeon: Rosetta Posner, MD;  Location: [facility];  Service: Vascular;  Laterality: Right;  . NO PAST SURGERIES    . ORIF PATELLA Left 05/17/2015   Procedure: OPEN REDUCTION INTERNAL (ORIF) FIXATION PATELLA;  Surgeon: Newt Minion, MD;  Location: [place];  Service: Orthopedics;  Laterality: Left;    No Known Allergies  Outpatient Encounter Medications as of 07/29/2018  Medication Sig  . amiodarone (PACERONE) 200 MG tablet Take 200 mg by mouth every evening.   . ARIPiprazole (ABILIFY) 15 MG tablet Take 15 mg by mouth daily.   Marland Kitchen atorvastatin (LIPITOR) 10 MG tablet Take 10 mg by mouth every evening.  . calcium acetate (PHOSLO) 667 MG tablet Take 3 tablets by mouth before meals every Sunday, Tuesday, Thursday and Saturday for Dialysis.  Give 3 tablets by mouth  two times a day every Mon, Wed, Friday for renal buffer  . clopidogrel (PLAVIX) 75 MG tablet Take 75 mg by mouth every evening.  . insulin aspart (NOVOLOG) 100 UNIT/ML injection Inject as per sliding scale subcutaneously after meals 0 - 70 = 0 units, notify MD is BS is less than 70 71 - 450 = 5 units. Notify MD if BS is greater than 450  . nitroGLYCERIN (NITROSTAT) 0.4 MG SL tablet Place 0.4 mg under the tongue every 5 (five) minutes as needed for chest pain.  . Nutritional Supplements (NUTRITIONAL SUPPLEMENT PO) Low Concentrated Sweets diet - Regular texture, Regular / thin consistency  . Nutritional Supplements (PROMOD) LIQD Take 60 mLs by mouth 2 (two) times daily.  . polyethylene glycol (MIRALAX / GLYCOLAX) packet Take 17 g by mouth daily.   . sevelamer (RENAGEL) 800 MG  tablet Give 3 tablets by mouth before meals  . traMADol (ULTRAM) 50 MG tablet Take 1 tablet (50 mg total) by mouth daily.   No facility-administered encounter medications on file as of 07/29/2018.     Review of Systems  Unable to perform ROS: Dementia (and nonverbal)    Immunization History  Administered Date(s) Administered  . Influenza,inj,Quad PF,6+ Mos 01/03/2014  . Influenza-Unspecified 09/24/2014, 11/13/2014, 02/01/2016, 09/20/2016, 10/08/2017  . PPD Test 07/28/2014, 09/15/2016  . Pneumococcal Polysaccharide-23 01/03/2014   Pertinent  Health Maintenance Due  Topic Date Due  . HEMOGLOBIN A1C  10/23/2018   Fall Risk  07/12/2017  Falls in the past year? No   Functional Status Survey:    Vitals:   07/29/18 0953  BP: 122/68  Pulse: 68  Resp: 16  Temp: 98.1 F (36.7 C)  SpO2: 98%  Weight: 148 lb (67.1 kg)  Height: 5\' 5"  (1.651 m)   Body mass index is 24.63 kg/m. Physical Exam  Constitutional: She is oriented to person, place, and time. She appears well-developed and well-nourished.  HENT:  Mouth/Throat: Oropharynx is clear and moist. No oropharyngeal exudate.  MMM; no oral thrush  Eyes:   Legally blind  Neck: Neck supple. Carotid bruit is not present. No tracheal deviation present.  Cardiovascular: Normal rate, regular rhythm and intact distal pulses. Exam reveals no gallop and no friction rub.  Murmur (1/6 SEM) heard. Left arm AVF with palpable thrill and audible bruit; no distal LE edema b/l; no calf TTP  Pulmonary/Chest: Effort normal and breath sounds normal. No stridor. No respiratory distress. She has no wheezes. She has no rales.  Abdominal: Soft. Normal appearance and bowel sounds are normal. She exhibits no distension and no mass. There is no hepatomegaly. There is no tenderness. There is no rigidity, no rebound and no guarding. No hernia.  obese  Musculoskeletal: She exhibits deformity (b/l TMA).  Lymphadenopathy:    She has no cervical adenopathy.  Neurological: She is alert and oriented to person, place, and time. She has normal reflexes.  Skin: Skin is warm and dry. No rash noted.  Psychiatric: She has a normal mood and affect. Her behavior is normal.  Speech unintelliglible    Labs reviewed: Recent Labs    05/01/18 1400 05/03/18 0907 05/06/18 0839  NA 136 131* 129*  K 4.3 4.9 4.8  CL 99* 92* 93*  CO2 26 27 25   GLUCOSE 114* 74 82  BUN 49* 60* 94*  CREATININE 7.04* 7.78* 9.38*  CALCIUM 9.2 8.7* 8.8*  PHOS 3.8 5.3* 5.2*   Recent Labs    08/01/17 11/10/17 04/23/18 05/01/18 1400 05/03/18 0907 05/06/18 0839  AST 13 14 8*  --   --   --   ALT 13 9 8   --   --   --   ALKPHOS 96 72 99  --   --   --   ALBUMIN  --   --   --  3.8 3.0* 3.0*   Recent Labs    11/10/17 04/23/18 04/30/18 2213  05/01/18 1401 05/03/18 0908 05/06/18 0838  WBC 6.0 5.0 8.2   < > 7.8 4.0 5.2  NEUTROABS 4 2 5.7  --   --   --   --   HGB 10.9* 11.4* 11.5*   < > 11.7* 12.0 10.0*  HCT 35* 35* 36.0   < > 36.2 37.4 30.7*  MCV  --   --  98.1   < > 96.8 96.1 94.8  PLT 275 207  257   < > 276 194 246   < > = values in this interval not displayed.   Lab Results  Component Value  Date   TSH 2.33 07/03/2018   Lab Results  Component Value Date   HGBA1C 5.5 04/23/2018   Lab Results  Component Value Date   CHOL 165 04/30/2018   HDL 50 04/30/2018   LDLCALC 103 04/30/2018   LDLDIRECT 170 (H) 08/09/2009   TRIG 57 04/30/2018   CHOLHDL 4.6 Ratio 02/09/2010    Significant Diagnostic Results in last 30 days:  No results found.  Assessment/Plan   ICD-10-CM   1. Adjustment disorder with disturbance of conduct F43.24   2. Psychosis, paranoid ([place]) [place]   3. ESRD (end stage renal disease) on dialysis ([place]) N18.6    Z99.2   4. Chronic combined systolic and diastolic congestive heart failure (HCC) I50.42   5. Diabetes mellitus with ESRD (end-stage renal disease) ([place]) E11.22    N18.6   6. PAD (peripheral artery disease) ([facility]) I73.9    s/p b/l TMA    Cont current meds as ordered  PT/OT/ST as indicated  HD as ordered  Psych services to follow  F/u with nephrology as scheduled  Will follow  Labs/tests ordered: none   Caytlyn Evers S. Perlie Gold  [facility] [address] [place], [place] [address] [phone] Cell (Monday-Friday 8 AM - 5 PM) [phone] After 5 PM and follow prompts

## 2018-07-30 DIAGNOSIS — Z79899 Other long term (current) drug therapy: Secondary | ICD-10-CM | POA: Diagnosis not present

## 2018-07-30 DIAGNOSIS — Z794 Long term (current) use of insulin: Secondary | ICD-10-CM | POA: Diagnosis not present

## 2018-07-30 DIAGNOSIS — E113293 Type 2 diabetes mellitus with mild nonproliferative diabetic retinopathy without macular edema, bilateral: Secondary | ICD-10-CM | POA: Diagnosis not present

## 2018-07-30 DIAGNOSIS — Z961 Presence of intraocular lens: Secondary | ICD-10-CM | POA: Diagnosis not present

## 2018-07-31 DIAGNOSIS — N2581 Secondary hyperparathyroidism of renal origin: Secondary | ICD-10-CM | POA: Diagnosis not present

## 2018-07-31 DIAGNOSIS — N186 End stage renal disease: Secondary | ICD-10-CM | POA: Diagnosis not present

## 2018-07-31 DIAGNOSIS — E1129 Type 2 diabetes mellitus with other diabetic kidney complication: Secondary | ICD-10-CM | POA: Diagnosis not present

## 2018-07-31 DIAGNOSIS — Z992 Dependence on renal dialysis: Secondary | ICD-10-CM | POA: Diagnosis not present

## 2018-07-31 DIAGNOSIS — D631 Anemia in chronic kidney disease: Secondary | ICD-10-CM | POA: Diagnosis not present

## 2018-08-02 DIAGNOSIS — D631 Anemia in chronic kidney disease: Secondary | ICD-10-CM | POA: Diagnosis not present

## 2018-08-02 DIAGNOSIS — N186 End stage renal disease: Secondary | ICD-10-CM | POA: Diagnosis not present

## 2018-08-02 DIAGNOSIS — N2581 Secondary hyperparathyroidism of renal origin: Secondary | ICD-10-CM | POA: Diagnosis not present

## 2018-08-02 DIAGNOSIS — Z992 Dependence on renal dialysis: Secondary | ICD-10-CM | POA: Diagnosis not present

## 2018-08-02 DIAGNOSIS — E1129 Type 2 diabetes mellitus with other diabetic kidney complication: Secondary | ICD-10-CM | POA: Diagnosis not present

## 2018-08-05 DIAGNOSIS — N186 End stage renal disease: Secondary | ICD-10-CM | POA: Diagnosis not present

## 2018-08-05 DIAGNOSIS — Z992 Dependence on renal dialysis: Secondary | ICD-10-CM | POA: Diagnosis not present

## 2018-08-05 DIAGNOSIS — E1129 Type 2 diabetes mellitus with other diabetic kidney complication: Secondary | ICD-10-CM | POA: Diagnosis not present

## 2018-08-05 DIAGNOSIS — D631 Anemia in chronic kidney disease: Secondary | ICD-10-CM | POA: Diagnosis not present

## 2018-08-05 DIAGNOSIS — N2581 Secondary hyperparathyroidism of renal origin: Secondary | ICD-10-CM | POA: Diagnosis not present

## 2018-08-07 DIAGNOSIS — Z992 Dependence on renal dialysis: Secondary | ICD-10-CM | POA: Diagnosis not present

## 2018-08-07 DIAGNOSIS — N186 End stage renal disease: Secondary | ICD-10-CM | POA: Diagnosis not present

## 2018-08-07 DIAGNOSIS — E1129 Type 2 diabetes mellitus with other diabetic kidney complication: Secondary | ICD-10-CM | POA: Diagnosis not present

## 2018-08-07 DIAGNOSIS — D631 Anemia in chronic kidney disease: Secondary | ICD-10-CM | POA: Diagnosis not present

## 2018-08-07 DIAGNOSIS — N2581 Secondary hyperparathyroidism of renal origin: Secondary | ICD-10-CM | POA: Diagnosis not present

## 2018-08-08 ENCOUNTER — Non-Acute Institutional Stay (SKILLED_NURSING_FACILITY): Payer: Medicare Other

## 2018-08-08 DIAGNOSIS — Z0000 Encounter for general adult medical examination without abnormal findings: Secondary | ICD-10-CM | POA: Diagnosis not present

## 2018-08-08 DIAGNOSIS — F200 Paranoid schizophrenia: Secondary | ICD-10-CM | POA: Diagnosis not present

## 2018-08-08 NOTE — Patient Instructions (Addendum)
Ms. [name] , Thank you for taking time to come for your Medicare Wellness Visit. I appreciate your ongoing commitment to your health goals. Please review the following plan we discussed and let me know if I can assist you in the future.   Screening recommendations/referrals: Colonoscopy excluded Mammogram excluded Bone Density  Recommended yearly ophthalmology/optometry visit for glaucoma screening and checkup Recommended yearly dental visit for hygiene and checkup  Vaccinations: Influenza vaccine due 2019 fall season Pneumococcal vaccine due at age 62 Tdap vaccine due, facility declined Shingles vaccine not in past records    Advanced directives: in chart  Conditions/risks identified: none  Next appointment: Dr. Eulas Post makes rounds  Preventive Care 62-64 Years, Female Preventive care refers to lifestyle choices and visits with your health care provider that can promote health and wellness. What does preventive care include?  A yearly physical exam. This is also called an annual well check.  Dental exams once or twice a year.  Routine eye exams. Ask your health care provider how often you should have your eyes checked.  Personal lifestyle choices, including:  Daily care of your teeth and gums.  Regular physical activity.  Eating a healthy diet.  Avoiding tobacco and drug use.  Limiting alcohol use.  Practicing safe sex.  Taking low-dose aspirin daily starting at age 35.  Taking vitamin and mineral supplements as recommended by your health care provider. What happens during an annual well check? The services and screenings done by your health care provider during your annual well check will depend on your age, overall health, lifestyle risk factors, and family history of disease. Counseling  Your health care provider may ask you questions about your:  Alcohol use.  Tobacco use.  Drug use.  Emotional well-being.  Home and relationship well-being.  Sexual  activity.  Eating habits.  Work and work Statistician.  Method of birth control.  Menstrual cycle.  Pregnancy history. Screening  You may have the following tests or measurements:  Height, weight, and BMI.  Blood pressure.  Lipid and cholesterol levels. These may be checked every 5 years, or more frequently if you are over 20 years old.  Skin check.  Lung cancer screening. You may have this screening every year starting at age 79 if you have a 30-pack-year history of smoking and currently smoke or have quit within the past 15 years.  Fecal occult blood test (FOBT) of the stool. You may have this test every year starting at age 13.  Flexible sigmoidoscopy or colonoscopy. You may have a sigmoidoscopy every 5 years or a colonoscopy every 10 years starting at age 47.  Hepatitis C blood test.  Hepatitis B blood test.  Sexually transmitted disease (STD) testing.  Diabetes screening. This is done by checking your blood sugar (glucose) after you have not eaten for a while (fasting). You may have this done every 1-3 years.  Mammogram. This may be done every 1-2 years. Talk to your health care provider about when you should start having regular mammograms. This may depend on whether you have a family history of breast cancer.  BRCA-related cancer screening. This may be done if you have a family history of breast, ovarian, tubal, or peritoneal cancers.  Pelvic exam and Pap test. This may be done every 3 years starting at age 26. Starting at age 70, this may be done every 5 years if you have a Pap test in combination with an HPV test.  Bone density scan. This is done to screen  for osteoporosis. You may have this scan if you are at high risk for osteoporosis. Discuss your test results, treatment options, and if necessary, the need for more tests with your health care provider. Vaccines  Your health care provider may recommend certain vaccines, such as:  Influenza vaccine. This is  recommended every year.  Tetanus, diphtheria, and acellular pertussis (Tdap, Td) vaccine. You may need a Td booster every 10 years.  Zoster vaccine. You may need this after age 49.  Pneumococcal 13-valent conjugate (PCV13) vaccine. You may need this if you have certain conditions and were not previously vaccinated.  Pneumococcal polysaccharide (PPSV23) vaccine. You may need one or two doses if you smoke cigarettes or if you have certain conditions. Talk to your health care provider about which screenings and vaccines you need and how often you need them. This information is not intended to replace advice given to you by your health care provider. Make sure you discuss any questions you have with your health care provider. Document Released: 01/07/2016 Document Revised: 08/30/2016 Document Reviewed: 10/12/2015 Elsevier Interactive Patient Education  2017 [place] Prevention in the Home Falls can cause injuries. They can happen to people of all ages. There are many things you can do to make your home safe and to help prevent falls. What can I do on the outside of my home?  Regularly fix the edges of walkways and driveways and fix any cracks.  Remove anything that might make you trip as you walk through a door, such as a raised step or threshold.  Trim any bushes or trees on the path to your home.  Use bright outdoor lighting.  Clear any walking paths of anything that might make someone trip, such as rocks or tools.  Regularly check to see if handrails are loose or broken. Make sure that both sides of any steps have handrails.  Any raised decks and porches should have guardrails on the edges.  Have any leaves, snow, or ice cleared regularly.  Use sand or salt on walking paths during winter.  Clean up any spills in your garage right away. This includes oil or grease spills. What can I do in the bathroom?  Use night lights.  Install grab bars by the toilet and in  the tub and shower. Do not use towel bars as grab bars.  Use non-skid mats or decals in the tub or shower.  If you need to sit down in the shower, use a plastic, non-slip stool.  Keep the floor dry. Clean up any water that spills on the floor as soon as it happens.  Remove soap buildup in the tub or shower regularly.  Attach bath mats securely with double-sided non-slip rug tape.  Do not have throw rugs and other things on the floor that can make you trip. What can I do in the bedroom?  Use night lights.  Make sure that you have a light by your bed that is easy to reach.  Do not use any sheets or blankets that are too big for your bed. They should not hang down onto the floor.  Have a firm chair that has side arms. You can use this for support while you get dressed.  Do not have throw rugs and other things on the floor that can make you trip. What can I do in the kitchen?  Clean up any spills right away.  Avoid walking on wet floors.  Keep items that you use  a lot in easy-to-reach places.  If you need to reach something above you, use a strong step stool that has a grab bar.  Keep electrical cords out of the way.  Do not use floor polish or wax that makes floors slippery. If you must use wax, use non-skid floor wax.  Do not have throw rugs and other things on the floor that can make you trip. What can I do with my stairs?  Do not leave any items on the stairs.  Make sure that there are handrails on both sides of the stairs and use them. Fix handrails that are broken or loose. Make sure that handrails are as Fake as the stairways.  Check any carpeting to make sure that it is firmly attached to the stairs. Fix any carpet that is loose or worn.  Avoid having throw rugs at the top or bottom of the stairs. If you do have throw rugs, attach them to the floor with carpet tape.  Make sure that you have a light switch at the top of the stairs and the bottom of the stairs. If  you do not have them, ask someone to add them for you. What else can I do to help prevent falls?  Wear shoes that:  Do not have high heels.  Have rubber bottoms.  Are comfortable and fit you well.  Are closed at the toe. Do not wear sandals.  If you use a stepladder:  Make sure that it is fully opened. Do not climb a closed stepladder.  Make sure that both sides of the stepladder are locked into place.  Ask someone to hold it for you, if possible.  Clearly mark and make sure that you can see:  Any grab bars or handrails.  First and last steps.  Where the edge of each step is.  Use tools that help you move around (mobility aids) if they are needed. These include:  Canes.  Walkers.  Scooters.  Crutches.  Turn on the lights when you go into a dark area. Replace any light bulbs as soon as they burn out.  Set up your furniture so you have a clear path. Avoid moving your furniture around.  If any of your floors are uneven, fix them.  If there are any pets around you, be aware of where they are.  Review your medicines with your doctor. Some medicines can make you feel dizzy. This can increase your chance of falling. Ask your doctor what other things that you can do to help prevent falls. This information is not intended to replace advice given to you by your health care provider. Make sure you discuss any questions you have with your health care provider. Document Released: 10/07/2009 Document Revised: 05/18/2016 Document Reviewed: 01/15/2015 Elsevier Interactive Patient Education  2017 [employer].

## 2018-08-08 NOTE — Progress Notes (Signed)
Subjective:   [name] is a 62 y.o. female who presents for Medicare Annual (Subsequent) preventive examination at [facility]; incapacitated patient unable to answer questions appropriately   Last AWV-07/12/2017    Objective:     Vitals: BP 118/70 (BP Location: Left Arm, Patient Position: Sitting)   Pulse 65   Temp 98.2 F (36.8 C) (Oral)   Ht 5\' 5"  (1.651 m)   Wt 148 lb (67.1 kg)   BMI 24.63 kg/m   Body mass index is 24.63 kg/m.  Advanced Directives 08/08/2018 07/29/2018 07/04/2018 06/06/2018 05/29/2018 05/22/2018 05/15/2018  Does Patient Have a Medical Advance Directive? Yes Yes Yes Yes Yes Yes Yes  Type of Advance Directive Out of facility DNR (pink MOST or yellow form) Out of facility DNR (pink MOST or yellow form) Out of facility DNR (pink MOST or yellow form) Out of facility DNR (pink MOST or yellow form) Out of facility DNR (pink MOST or yellow form) Out of facility DNR (pink MOST or yellow form) Out of facility DNR (pink MOST or yellow form)  Does patient want to make changes to medical advance directive? No - Patient declined No - Patient declined No - Patient declined No - Patient declined No - Patient declined No - Patient declined No - Patient declined  Copy of [place] in [place]  Would patient like information on creating a medical advance directive? - - - - - - -  Pre-existing out of facility DNR order (yellow form or pink MOST form) Pink MOST form placed in chart (order not valid for inpatient use) Pink MOST form placed in chart (order not valid for inpatient use) Pink MOST form placed in chart (order not valid for inpatient use) Pink MOST form placed in chart (order not valid for inpatient use) Pink MOST form placed in chart (order not valid for inpatient use) Pink MOST form placed in chart (order not valid for inpatient use) Pink MOST form placed in chart (order not valid for inpatient use)    Tobacco Social History    Tobacco Use  Smoking Status Never Smoker  Smokeless Tobacco Never Used  Tobacco Comment   Prior dip     Counseling given: Not Answered Comment: Prior dip   Clinical Intake:  Pre-visit preparation completed: No  Pain : 0-10 Pain Score: 0-No pain     Nutritional Risks: None Diabetes: Yes CBG done?: No Did pt. bring in CBG monitor from home?: No  How often do you need to have someone help you when you read instructions, pamphlets, or other written materials from your doctor or pharmacy?: 5 - Always  Interpreter Needed?: No  Information entered by :: Tyson Dense, RN  Past Medical History:  Diagnosis Date  . Anemia    Due to fibroids.  . Anemia in CKD (chronic kidney disease) 12/14/2014  . Benign hypertension with ESRD (end-stage renal disease) ([facility] [facility]) 02/12/2018  . Bilateral leg edema    a. Chronic.  Marland Kitchen Blind   . Cardiac arrest ([facility]) 12/12/2014  . CHF (congestive heart failure) ([facility])   . Coronary artery disease   . Deaf    USE SIGN INTERPRETER.  Divorced from husband but lives with him. Has daughter but she does not care for her.  . Diabetes mellitus    a. Per PCP note 2012 (A1C 9.2) - pt unwilling to take meds and was educated on risk of uncontrolled DM.  b.  A1C 5.9 in 11/2013.  Marland Kitchen ESRD on hemodialysis ([place])    Mon, Wed, Fri  . Essential hypertension 08/09/2009   Qualifier: Diagnosis of  By: Sarita Haver  MD, Coralyn Mark    . Fibroid uterus   . Heart murmur   . Hyperlipidemia   . Hypertension    a. Has previously refused blood pressure meds.   . Leiomyoma of uterus 08/09/2009   Qualifier: Diagnosis of  By: Sarita Haver  MD, Coralyn Mark    . MI (myocardial infarction) ([facility]) 11/2014  . Moderate tricuspid regurgitation   . Osteomyelitis ([place])   . Psychiatric disorder    She frequently exhibits paranoia and has been diagnosed with psychotic d/o NOS during hospital stay in the past. She is tangetial and perseverative during her visits. She apparently has had a bad experience with  mental health in [place] in the past and refuses to discuss mental issues for fear that she will be sent back there. Her paranoia, communication issues, financial woes and lack of fam  . Pulmonary hypertension ([place])    Past Surgical History:  Procedure Laterality Date  . AMPUTATION Bilateral 04/02/2015   Procedure: Amputation Bilateral Great Toes MTP Joint;  Surgeon: Newt Minion, MD;  Location: [facility];  Service: Orthopedics;  Laterality: Bilateral;  . AMPUTATION Left 05/14/2015   Procedure: Left Transmetatarsal Amputation;  Surgeon: Newt Minion, MD;  Location: [facility];  Service: Orthopedics;  Laterality: Left;  . AMPUTATION Right 08/20/2015   Procedure: Transmetatarsal Amputation Right Foot;  Surgeon: Newt Minion, MD;  Location: [facility];  Service: Orthopedics;  Laterality: Right;  . AV FISTULA PLACEMENT Left 01/02/2014   Procedure: ARTERIOVENOUS (AV) FISTULA CREATION;  Surgeon: Rosetta Posner, MD;  Location: [facility];  Service: Vascular;  Laterality: Left;  . INSERTION OF DIALYSIS CATHETER Right 01/02/2014   Procedure: INSERTION OF DIALYSIS CATHETER;  Surgeon: Rosetta Posner, MD;  Location: [facility];  Service: Vascular;  Laterality: Right;  . NO PAST SURGERIES    . ORIF PATELLA Left 05/17/2015   Procedure: OPEN REDUCTION INTERNAL (ORIF) FIXATION PATELLA;  Surgeon: Newt Minion, MD;  Location: [facility];  Service: Orthopedics;  Laterality: Left;   Family History  Problem Relation Age of Onset  . Other Father        Drowned from fishing?  . Heart disease Unknown    Social History   Socioeconomic History  . Marital status: Widowed    Spouse name: Not on file  . Number of children: Not on file  . Years of education: Not on file  . Highest education level: Not on file  Occupational History  . Not on file  Social Needs  . Financial resource strain: Not on file  . Food insecurity:    Worry: Not on file    Inability: Not on file  . Transportation needs:    Medical: Not on file    Non-medical: Not on file   Tobacco Use  . Smoking status: Never Smoker  . Smokeless tobacco: Never Used  . Tobacco comment: Prior dip  Substance and Sexual Activity  . Alcohol use: No  . Drug use: No  . Sexual activity: Not on file  Lifestyle  . Physical activity:    Days per week: Not on file    Minutes per session: Not on file  . Stress: Not on file  Relationships  . Social connections:    Talks on phone: Not on file    Gets together: Not on file  Attends religious service: Not on file    Active member of club or organization: Not on file    Attends meetings of clubs or organizations: Not on file    Relationship status: Not on file  Other Topics Concern  . Not on file  Social History Narrative  . Not on file    Outpatient Encounter Medications as of 08/08/2018  Medication Sig  . amiodarone (PACERONE) 200 MG tablet Take 200 mg by mouth every evening.   . ARIPiprazole (ABILIFY) 15 MG tablet Take 15 mg by mouth daily.   Marland Kitchen atorvastatin (LIPITOR) 10 MG tablet Take 10 mg by mouth every evening.  . calcium acetate (PHOSLO) 667 MG tablet Take 3 tablets by mouth before meals every Sunday, Tuesday, Thursday and Saturday for Dialysis.  Give 3 tablets by mouth two times a day every Mon, Wed, Friday for renal buffer  . clopidogrel (PLAVIX) 75 MG tablet Take 75 mg by mouth every evening.  . insulin aspart (NOVOLOG) 100 UNIT/ML injection Inject as per sliding scale subcutaneously after meals 0 - 70 = 0 units, notify MD is BS is less than 70 71 - 450 = 5 units. Notify MD if BS is greater than 450  . nitroGLYCERIN (NITROSTAT) 0.4 MG SL tablet Place 0.4 mg under the tongue every 5 (five) minutes as needed for chest pain.  . Nutritional Supplements (NUTRITIONAL SUPPLEMENT PO) Low Concentrated Sweets diet - Regular texture, Regular / thin consistency  . Nutritional Supplements (PROMOD) LIQD Take 60 mLs by mouth 2 (two) times daily.  . polyethylene glycol (MIRALAX / GLYCOLAX) packet Take 17 g by mouth daily.   .  sevelamer (RENAGEL) 800 MG tablet Give 3 tablets by mouth before meals  . traMADol (ULTRAM) 50 MG tablet Take 1 tablet (50 mg total) by mouth daily.   No facility-administered encounter medications on file as of 08/08/2018.     Activities of Daily Living In your present state of health, do you have any difficulty performing the following activities: 08/08/2018 05/01/2018  Hearing? Tempie Donning  Vision? Y Y  Difficulty concentrating or making decisions? N N  Walking or climbing stairs? Y Y  Dressing or bathing? Y Y  Doing errands, shopping? Tempie Donning  Preparing Food and eating ? Y -  Using the Toilet? Y -  In the past six months, have you accidently leaked urine? Y -  Do you have problems with loss of bowel control? Y -  Managing your Medications? Y -  Managing your Finances? Y -  Housekeeping or managing your Housekeeping? Y -  Some recent data might be hidden    Patient Care Team: [name], DO as PCP - General (Internal Medicine) Nyoka Cowden Phylis Bougie, NP as Nurse Practitioner ([facility]) [facility], [place] ([place])    Assessment:   This is a routine wellness examination for Eiko.  Exercise Activities and Dietary recommendations Current Exercise Habits: The patient does not participate in regular exercise at present, Exercise limited by: Other - see comments(blind and deaf)  Goals   None     Fall Risk Fall Risk  08/08/2018 07/12/2017  Falls in the past year? No No   Is the patient's home free of loose throw rugs in walkways, pet beds, electrical cords, etc?   yes      Grab bars in the bathroom? yes      Handrails on the stairs?   yes      Adequate lighting?   yes  Depression  Screen PHQ 2/9 Scores 08/08/2018 07/12/2017  PHQ - 2 Score 0 -  Exception Documentation - Other- indicate reason in comment box     Cognitive Function MMSE - Mini Mental State Exam 08/08/2018 07/12/2017  Not completed: Unable to complete Unable to complete         Immunization History  Administered Date(s) Administered  . Influenza,inj,Quad PF,6+ Mos 01/03/2014  . Influenza-Unspecified 09/24/2014, 11/13/2014, 02/01/2016, 09/20/2016, 10/08/2017  . PPD Test 07/28/2014, 09/15/2016  . Pneumococcal Polysaccharide-23 01/03/2014    Qualifies for Shingles Vaccine? Not in past records  Screening Tests Health Maintenance  Topic Date Due  . HEMOGLOBIN A1C  10/23/2018  . PNEUMOCOCCAL POLYSACCHARIDE VACCINE AGE 100-64 HIGH RISK  Completed  . HIV Screening  Completed    Cancer Screenings: Lung: Low Dose CT Chest recommended if Age 16-80 years, 30 pack-year currently smoking OR have quit w/in 15years. Patient does not qualify. Breast:  Up to date on Mammogram? excluded Up to date of Bone Density/Dexa? Due at age 41 Colorectal: excluded TDAP due: facility declined    Plan:    I have personally reviewed and addressed the Medicare Annual Wellness questionnaire and have noted the following in the patient's chart:  A. Medical and social history B. Use of alcohol, tobacco or illicit drugs  C. Current medications and supplements D. Functional ability and status E.  Nutritional status F.  Physical activity G. Advance directives H. List of other physicians I.  Hospitalizations, surgeries, and ER visits in previous 12 months J.  [place] to include hearing, vision, cognitive, depression L. Referrals and appointments - none  In addition, I am unable to review and discuss with incapacitated patient certain preventive protocols, quality metrics, and best practice recommendations. A written personalized care plan for preventive services as well as general preventive health recommendations were provided to patient.   See attached scanned questionnaire for additional information.   Signed,   Tyson Dense, RN Nurse Health Advisor  Patient Concerns: None

## 2018-08-09 DIAGNOSIS — D631 Anemia in chronic kidney disease: Secondary | ICD-10-CM | POA: Diagnosis not present

## 2018-08-09 DIAGNOSIS — N2581 Secondary hyperparathyroidism of renal origin: Secondary | ICD-10-CM | POA: Diagnosis not present

## 2018-08-09 DIAGNOSIS — Z992 Dependence on renal dialysis: Secondary | ICD-10-CM | POA: Diagnosis not present

## 2018-08-09 DIAGNOSIS — N186 End stage renal disease: Secondary | ICD-10-CM | POA: Diagnosis not present

## 2018-08-09 DIAGNOSIS — E1129 Type 2 diabetes mellitus with other diabetic kidney complication: Secondary | ICD-10-CM | POA: Diagnosis not present

## 2018-08-12 DIAGNOSIS — N2581 Secondary hyperparathyroidism of renal origin: Secondary | ICD-10-CM | POA: Diagnosis not present

## 2018-08-12 DIAGNOSIS — D631 Anemia in chronic kidney disease: Secondary | ICD-10-CM | POA: Diagnosis not present

## 2018-08-12 DIAGNOSIS — N186 End stage renal disease: Secondary | ICD-10-CM | POA: Diagnosis not present

## 2018-08-12 DIAGNOSIS — E1129 Type 2 diabetes mellitus with other diabetic kidney complication: Secondary | ICD-10-CM | POA: Diagnosis not present

## 2018-08-12 DIAGNOSIS — Z992 Dependence on renal dialysis: Secondary | ICD-10-CM | POA: Diagnosis not present

## 2018-08-14 ENCOUNTER — Encounter: Payer: Self-pay | Admitting: Internal Medicine

## 2018-08-14 DIAGNOSIS — N186 End stage renal disease: Secondary | ICD-10-CM | POA: Diagnosis not present

## 2018-08-14 DIAGNOSIS — D631 Anemia in chronic kidney disease: Secondary | ICD-10-CM | POA: Diagnosis not present

## 2018-08-14 DIAGNOSIS — E1129 Type 2 diabetes mellitus with other diabetic kidney complication: Secondary | ICD-10-CM | POA: Diagnosis not present

## 2018-08-14 DIAGNOSIS — N2581 Secondary hyperparathyroidism of renal origin: Secondary | ICD-10-CM | POA: Diagnosis not present

## 2018-08-14 DIAGNOSIS — Z992 Dependence on renal dialysis: Secondary | ICD-10-CM | POA: Diagnosis not present

## 2018-08-16 DIAGNOSIS — N2581 Secondary hyperparathyroidism of renal origin: Secondary | ICD-10-CM | POA: Diagnosis not present

## 2018-08-16 DIAGNOSIS — Z992 Dependence on renal dialysis: Secondary | ICD-10-CM | POA: Diagnosis not present

## 2018-08-16 DIAGNOSIS — D631 Anemia in chronic kidney disease: Secondary | ICD-10-CM | POA: Diagnosis not present

## 2018-08-16 DIAGNOSIS — N186 End stage renal disease: Secondary | ICD-10-CM | POA: Diagnosis not present

## 2018-08-16 DIAGNOSIS — E1129 Type 2 diabetes mellitus with other diabetic kidney complication: Secondary | ICD-10-CM | POA: Diagnosis not present

## 2018-08-19 ENCOUNTER — Other Ambulatory Visit: Payer: Self-pay

## 2018-08-19 DIAGNOSIS — E1129 Type 2 diabetes mellitus with other diabetic kidney complication: Secondary | ICD-10-CM | POA: Diagnosis not present

## 2018-08-19 DIAGNOSIS — N2581 Secondary hyperparathyroidism of renal origin: Secondary | ICD-10-CM | POA: Diagnosis not present

## 2018-08-19 DIAGNOSIS — D631 Anemia in chronic kidney disease: Secondary | ICD-10-CM | POA: Diagnosis not present

## 2018-08-19 DIAGNOSIS — Z992 Dependence on renal dialysis: Secondary | ICD-10-CM | POA: Diagnosis not present

## 2018-08-19 DIAGNOSIS — N186 End stage renal disease: Secondary | ICD-10-CM | POA: Diagnosis not present

## 2018-08-19 MED ORDER — TRAMADOL HCL 50 MG PO TABS: 50.0000 mg | ORAL_TABLET | Freq: Every day | ORAL | 1 refills | Status: DC

## 2018-08-19 NOTE — Telephone Encounter (Signed)
Rx faxed to [facility] (P) [phone], (F) [phone] 

## 2018-08-21 DIAGNOSIS — N186 End stage renal disease: Secondary | ICD-10-CM | POA: Diagnosis not present

## 2018-08-21 DIAGNOSIS — D631 Anemia in chronic kidney disease: Secondary | ICD-10-CM | POA: Diagnosis not present

## 2018-08-21 DIAGNOSIS — Z992 Dependence on renal dialysis: Secondary | ICD-10-CM | POA: Diagnosis not present

## 2018-08-21 DIAGNOSIS — E1129 Type 2 diabetes mellitus with other diabetic kidney complication: Secondary | ICD-10-CM | POA: Diagnosis not present

## 2018-08-21 DIAGNOSIS — N2581 Secondary hyperparathyroidism of renal origin: Secondary | ICD-10-CM | POA: Diagnosis not present

## 2018-08-23 DIAGNOSIS — Z992 Dependence on renal dialysis: Secondary | ICD-10-CM | POA: Diagnosis not present

## 2018-08-23 DIAGNOSIS — N186 End stage renal disease: Secondary | ICD-10-CM | POA: Diagnosis not present

## 2018-08-23 DIAGNOSIS — E1129 Type 2 diabetes mellitus with other diabetic kidney complication: Secondary | ICD-10-CM | POA: Diagnosis not present

## 2018-08-23 DIAGNOSIS — N2581 Secondary hyperparathyroidism of renal origin: Secondary | ICD-10-CM | POA: Diagnosis not present

## 2018-08-23 DIAGNOSIS — D631 Anemia in chronic kidney disease: Secondary | ICD-10-CM | POA: Diagnosis not present

## 2018-08-25 DIAGNOSIS — E1129 Type 2 diabetes mellitus with other diabetic kidney complication: Secondary | ICD-10-CM | POA: Diagnosis not present

## 2018-08-25 DIAGNOSIS — Z992 Dependence on renal dialysis: Secondary | ICD-10-CM | POA: Diagnosis not present

## 2018-08-25 DIAGNOSIS — N186 End stage renal disease: Secondary | ICD-10-CM | POA: Diagnosis not present

## 2018-08-26 DIAGNOSIS — D631 Anemia in chronic kidney disease: Secondary | ICD-10-CM | POA: Diagnosis not present

## 2018-08-26 DIAGNOSIS — N2581 Secondary hyperparathyroidism of renal origin: Secondary | ICD-10-CM | POA: Diagnosis not present

## 2018-08-26 DIAGNOSIS — Z992 Dependence on renal dialysis: Secondary | ICD-10-CM | POA: Diagnosis not present

## 2018-08-26 DIAGNOSIS — N186 End stage renal disease: Secondary | ICD-10-CM | POA: Diagnosis not present

## 2018-08-26 DIAGNOSIS — E1129 Type 2 diabetes mellitus with other diabetic kidney complication: Secondary | ICD-10-CM | POA: Diagnosis not present

## 2018-08-26 DIAGNOSIS — Z23 Encounter for immunization: Secondary | ICD-10-CM | POA: Diagnosis not present

## 2018-08-28 DIAGNOSIS — E1129 Type 2 diabetes mellitus with other diabetic kidney complication: Secondary | ICD-10-CM | POA: Diagnosis not present

## 2018-08-28 DIAGNOSIS — Z992 Dependence on renal dialysis: Secondary | ICD-10-CM | POA: Diagnosis not present

## 2018-08-28 DIAGNOSIS — N186 End stage renal disease: Secondary | ICD-10-CM | POA: Diagnosis not present

## 2018-08-28 DIAGNOSIS — N2581 Secondary hyperparathyroidism of renal origin: Secondary | ICD-10-CM | POA: Diagnosis not present

## 2018-08-28 DIAGNOSIS — D631 Anemia in chronic kidney disease: Secondary | ICD-10-CM | POA: Diagnosis not present

## 2018-08-28 DIAGNOSIS — Z23 Encounter for immunization: Secondary | ICD-10-CM | POA: Diagnosis not present

## 2018-08-29 ENCOUNTER — Encounter: Payer: Self-pay | Admitting: Adult Health

## 2018-08-29 ENCOUNTER — Non-Acute Institutional Stay (SKILLED_NURSING_FACILITY): Payer: Medicare Other | Admitting: Adult Health

## 2018-08-29 DIAGNOSIS — I472 Ventricular tachycardia, unspecified: Secondary | ICD-10-CM

## 2018-08-29 DIAGNOSIS — F4324 Adjustment disorder with disturbance of conduct: Secondary | ICD-10-CM | POA: Diagnosis not present

## 2018-08-29 DIAGNOSIS — K5909 Other constipation: Secondary | ICD-10-CM

## 2018-08-29 DIAGNOSIS — I255 Ischemic cardiomyopathy: Secondary | ICD-10-CM | POA: Diagnosis not present

## 2018-08-29 DIAGNOSIS — I5042 Chronic combined systolic (congestive) and diastolic (congestive) heart failure: Secondary | ICD-10-CM | POA: Diagnosis not present

## 2018-08-29 NOTE — Progress Notes (Signed)
Location:   [facility] Room Number: 202 A Place of Service:  SNF (31)   CODE STATUS: Full Code  No Known Allergies  Chief Complaint  Patient presents with  . Medical Management of Chronic Issues    Heart failure; ventricular tachycardia; constipation; adjustment disorder     HPI:  She is a 62 year old [place] term resident of this facility being seen for the management of her chronic illnesses; heart failure; ventricular tachycardia; constipation. She is unable to participate in the hpi or ros. There are no reports of anxiety; constipation; no uncontrolled pain. There are no nursing concerns at this time.   Past Medical History:  Diagnosis Date  . Anemia    Due to fibroids.  . Anemia in CKD (chronic kidney disease) 12/14/2014  . Benign hypertension with ESRD (end-stage renal disease) ([place]) 02/12/2018  . Bilateral leg edema    a. Chronic.  Marland Kitchen Blind   . Cardiac arrest ([place]) 12/12/2014  . CHF (congestive heart failure) ([place])   . Coronary artery disease   . Deaf    USE SIGN INTERPRETER.  Divorced from husband but lives with him. Has daughter but she does not care for her.  . Diabetes mellitus    a. Per PCP note 2012 (A1C 9.2) - pt unwilling to take meds and was educated on risk of uncontrolled DM.  b. A1C 5.9 in 11/2013.  Marland Kitchen ESRD on hemodialysis ([place])    Mon, Wed, Fri  . Essential hypertension 08/09/2009   Qualifier: Diagnosis of  By: Sarita Haver  MD, Coralyn Mark    . Fibroid uterus   . Heart murmur   . Hyperlipidemia   . Hypertension    a. Has previously refused blood pressure meds.   . Leiomyoma of uterus 08/09/2009   Qualifier: Diagnosis of  By: Sarita Haver  MD, Coralyn Mark    . MI (myocardial infarction) ([place]) 11/2014  . Moderate tricuspid regurgitation   . Osteomyelitis ([place])   . Psychiatric disorder    She frequently exhibits paranoia and has been diagnosed with psychotic d/o NOS during hospital stay in the past. She is tangetial and perseverative during her visits. She  apparently has had a bad experience with mental health in [place] in the past and refuses to discuss mental issues for fear that she will be sent back there. Her paranoia, communication issues, financial woes and lack of fam  . Pulmonary hypertension ([place])     Past Surgical History:  Procedure Laterality Date  . AMPUTATION Bilateral 04/02/2015   Procedure: Amputation Bilateral Great Toes MTP Joint;  Surgeon: Newt Minion, MD;  Location: [place];  Service: Orthopedics;  Laterality: Bilateral;  . AMPUTATION Left 05/14/2015   Procedure: Left Transmetatarsal Amputation;  Surgeon: Newt Minion, MD;  Location: [place];  Service: Orthopedics;  Laterality: Left;  . AMPUTATION Right 08/20/2015   Procedure: Transmetatarsal Amputation Right Foot;  Surgeon: Newt Minion, MD;  Location: [place];  Service: Orthopedics;  Laterality: Right;  . AV FISTULA PLACEMENT Left 01/02/2014   Procedure: ARTERIOVENOUS (AV) FISTULA CREATION;  Surgeon: Rosetta Posner, MD;  Location: [place];  Service: Vascular;  Laterality: Left;  . INSERTION OF DIALYSIS CATHETER Right 01/02/2014   Procedure: INSERTION OF DIALYSIS CATHETER;  Surgeon: Rosetta Posner, MD;  Location: [place];  Service: Vascular;  Laterality: Right;  . NO PAST SURGERIES    . ORIF PATELLA Left 05/17/2015   Procedure: OPEN REDUCTION INTERNAL (ORIF) FIXATION PATELLA;  Surgeon: Newt Minion,  MD;  Location: [place];  Service: Orthopedics;  Laterality: Left;    Social History   Socioeconomic History  . Marital status: Widowed    Spouse name: Not on file  . Number of children: Not on file  . Years of education: Not on file  . Highest education level: Not on file  Occupational History  . Not on file  Social Needs  . Financial resource strain: Not on file  . Food insecurity:    Worry: Not on file    Inability: Not on file  . Transportation needs:    Medical: Not on file    Non-medical: Not on file  Tobacco Use  . Smoking status: Never Smoker  . Smokeless tobacco: Never Used    . Tobacco comment: Prior dip  Substance and Sexual Activity  . Alcohol use: No  . Drug use: No  . Sexual activity: Not on file  Lifestyle  . Physical activity:    Days per week: Not on file    Minutes per session: Not on file  . Stress: Not on file  Relationships  . Social connections:    Talks on phone: Not on file    Gets together: Not on file    Attends religious service: Not on file    Active member of club or organization: Not on file    Attends meetings of clubs or organizations: Not on file    Relationship status: Not on file  . Intimate partner violence:    Fear of current or ex partner: Not on file    Emotionally abused: Not on file    Physically abused: Not on file    Forced sexual activity: Not on file  Other Topics Concern  . Not on file  Social History Narrative  . Not on file   Family History  Problem Relation Age of Onset  . Other Father        Drowned from fishing?  . Heart disease Unknown       VITAL SIGNS Ht 5\' 5"  (1.651 m)   Wt 152 lb 9.6 oz (69.2 kg)   BMI 25.39 kg/m   Outpatient Encounter Medications as of 08/29/2018  Medication Sig  . amiodarone (PACERONE) 200 MG tablet Take 200 mg by mouth every evening.   . ARIPiprazole (ABILIFY) 15 MG tablet Take 15 mg by mouth daily.   Marland Kitchen atorvastatin (LIPITOR) 10 MG tablet Take 10 mg by mouth every evening.  . calcium acetate (PHOSLO) 667 MG tablet Take 3 tablets by mouth before meals every Sunday, Tuesday, Thursday and Saturday for Dialysis.  Give 3 tablets by mouth two times a day every Mon, Wed, Friday for renal buffer  . clopidogrel (PLAVIX) 75 MG tablet Take 75 mg by mouth every evening.  . insulin aspart (NOVOLOG) 100 UNIT/ML injection Inject as per sliding scale subcutaneously after meals 0 - 70 = 0 units, notify MD is BS is less than 70 71 - 450 = 5 units. Notify MD if BS is greater than 450  . nitroGLYCERIN (NITROSTAT) 0.4 MG SL tablet Place 0.4 mg under the tongue every 5 (five) minutes as  needed for chest pain.  . Nutritional Supplements (NUTRITIONAL SUPPLEMENT PO) Low Concentrated Sweets diet - Regular texture, Regular / thin consistency  . Nutritional Supplements (PROMOD) LIQD Take 60 mLs by mouth 2 (two) times daily.  . polyethylene glycol (MIRALAX / GLYCOLAX) packet Take 17 g by mouth daily.   . sevelamer (RENAGEL) 800 MG tablet Give 3  tablets by mouth before meals  . traMADol (ULTRAM) 50 MG tablet Take 1 tablet (50 mg total) by mouth daily.   No facility-administered encounter medications on file as of 08/29/2018.      SIGNIFICANT DIAGNOSTIC EXAMS  PREVIOUS  11-24-14: Myoview: 1. Fixed defects involving the apex, anterior lateral wall and the inferior wall. The apex and anterior lateral wall fixed defects aresuggestive for an infarct. No evidence for reversibility or ischemia. 2. Diffuse hypokinesia and minimal wall motion along the lateral wall. 3. Left ventricular ejection fraction is 35%. 4. High-risk stress test findings*.  04-16-15: 2-d echo: - Left ventricle: The cavity size was mildly dilated. Wall thickness was increased in a pattern of mild LVH. Systolic function was moderately to severely reduced. The estimated ejection fraction was in the range of 30% to 35%. There is akinesis of the anteroseptal and apical myocardium. Features are consistent with a pseudonormal left ventricular filling pattern, with concomitant abnormal relaxation and increased filling pressure (grade 2 diastolic dysfunction). Doppler parameters are consistent with high ventricular filling pressure. - Mitral valve: Calcified annulus. There was mild regurgitation. - Left atrium: The atrium was mildly dilated. - Pulmonary arteries: Systolic pressure was mildly increased.   NO NEW EXAMS   LABS REVIEWED: she will decline labs PREVIOUS     11-10-17: wbc 6.0; hgb 10.9; hct 34.7; mcv 88.8; plt 275; glucose 421; bun 25.3; creat 1.11; k+ 4.5; na++ 143; ca 8.6; liver normal albumin 3.1; tsh 3.07;  hgb a1c 10.8; chol 170; ldl 101; trig 165; hdl 36  04-06-18: hgb a1c 5.5 04-30-18: wbc 8.2; hgb 11.5; hct 36.0; mcv 98.1; plt 257; glucose 177; bun 49; creat 8.30; k+ 5.6; na++ 134; ammonia 38; vit B 12: 401; tsh 4.13-19 chol 57; ldl 103; trig 165; hdl 50; RPR: nr 05-03-18: wbc 4.0; hgb 12.0; hct 37.4; mcv 96.1; plt 194; glucose 74; bun 60; creat 7.78; k+ 4,9; na++131; ca 8.7; phos 5.3; albumin 3.0  07-03-18: tsh 2.33  NO NEW LABS.    Review of Systems  Unable to perform ROS: Other (deaf and blind )    Physical Exam  Constitutional: She appears well-developed and well-nourished. No distress.  Neck: No thyromegaly present.  Cardiovascular: Normal rate, regular rhythm and intact distal pulses.  Murmur heard. 1/6  Pulmonary/Chest: Effort normal and breath sounds normal. No respiratory distress.  Abdominal: Soft. Bowel sounds are normal. She exhibits no distension. There is no tenderness.  Musculoskeletal: She exhibits no edema.  Status post bilateral transmetatarsal amputations  Is able to move all extremities        Lymphadenopathy:    She has no cervical adenopathy.  Neurological: She is alert.  Skin: Skin is warm and dry. She is not diaphoretic.  Left upper arm a/v fistula: + thrill + bruitt        Psychiatric: She has a normal mood and affect.     ASSESSMENT/ PLAN:  TODAY:   1. Chronic combined systolic and diastolic heart failure: has ischemic cardiomyopathy  EF is 30-35%  (04-16-15); is presently stable will continue to monitor   2. Sustained ventricular trachycardia: not a candidate for ICD. Is on amiodarone 200 mg daily for heart rate control will monitor   3. 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   4. Chronic constipation: is stable will continue miralax daily   PREVIOUS   5. 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   6. Dyslipidemia associated with type 2 diabetes mellitus:  Without change; ldl 103; will continue lipitor 10 mg daily    7. ESRD on hemodialysis: is followed by nephrology. Will continue dialysis as scheduled. Will continue phoslo 667 mg 3 tabs three times daily on Sunday; Tuesday; Thursday; Saturday; give 2 tabs three times daily on Monday; Wednesday; Friday and is taking renvela 800 mg three tabs three times daily    8. Anemia in CKD on chronic dialysis: stable  hgb 12.0 will monitor   9. Benign hypertension with ESRD and hypertensive heart disease with chronic combined heart failure : b/p 141/69 is presently stable   10. PAD: is without change is status post left and right transmetatarsal amputation: will continue plavix 75 mg daily  Will continue ultram 50 mg daily for pain management     MD is aware of resident's narcotic use and is in agreement with current plan of care. We will attempt to wean resident as apropriate   Ok Edwards NP [facility]  Contact [phone] Monday through Friday 8am- 5pm  After hours call [phone]

## 2018-08-30 DIAGNOSIS — Z23 Encounter for immunization: Secondary | ICD-10-CM | POA: Diagnosis not present

## 2018-08-30 DIAGNOSIS — Z992 Dependence on renal dialysis: Secondary | ICD-10-CM | POA: Diagnosis not present

## 2018-08-30 DIAGNOSIS — E1129 Type 2 diabetes mellitus with other diabetic kidney complication: Secondary | ICD-10-CM | POA: Diagnosis not present

## 2018-08-30 DIAGNOSIS — N186 End stage renal disease: Secondary | ICD-10-CM | POA: Diagnosis not present

## 2018-08-30 DIAGNOSIS — N2581 Secondary hyperparathyroidism of renal origin: Secondary | ICD-10-CM | POA: Diagnosis not present

## 2018-08-30 DIAGNOSIS — D631 Anemia in chronic kidney disease: Secondary | ICD-10-CM | POA: Diagnosis not present

## 2018-09-02 DIAGNOSIS — Z23 Encounter for immunization: Secondary | ICD-10-CM | POA: Diagnosis not present

## 2018-09-02 DIAGNOSIS — N2581 Secondary hyperparathyroidism of renal origin: Secondary | ICD-10-CM | POA: Diagnosis not present

## 2018-09-02 DIAGNOSIS — Z992 Dependence on renal dialysis: Secondary | ICD-10-CM | POA: Diagnosis not present

## 2018-09-02 DIAGNOSIS — D631 Anemia in chronic kidney disease: Secondary | ICD-10-CM | POA: Diagnosis not present

## 2018-09-02 DIAGNOSIS — E1129 Type 2 diabetes mellitus with other diabetic kidney complication: Secondary | ICD-10-CM | POA: Diagnosis not present

## 2018-09-02 DIAGNOSIS — N186 End stage renal disease: Secondary | ICD-10-CM | POA: Diagnosis not present

## 2018-09-04 DIAGNOSIS — N2581 Secondary hyperparathyroidism of renal origin: Secondary | ICD-10-CM | POA: Diagnosis not present

## 2018-09-04 DIAGNOSIS — N186 End stage renal disease: Secondary | ICD-10-CM | POA: Diagnosis not present

## 2018-09-04 DIAGNOSIS — Z992 Dependence on renal dialysis: Secondary | ICD-10-CM | POA: Diagnosis not present

## 2018-09-04 DIAGNOSIS — D631 Anemia in chronic kidney disease: Secondary | ICD-10-CM | POA: Diagnosis not present

## 2018-09-04 DIAGNOSIS — E1129 Type 2 diabetes mellitus with other diabetic kidney complication: Secondary | ICD-10-CM | POA: Diagnosis not present

## 2018-09-04 DIAGNOSIS — Z23 Encounter for immunization: Secondary | ICD-10-CM | POA: Diagnosis not present

## 2018-09-05 DIAGNOSIS — F200 Paranoid schizophrenia: Secondary | ICD-10-CM | POA: Diagnosis not present

## 2018-09-06 DIAGNOSIS — Z23 Encounter for immunization: Secondary | ICD-10-CM | POA: Diagnosis not present

## 2018-09-06 DIAGNOSIS — D631 Anemia in chronic kidney disease: Secondary | ICD-10-CM | POA: Diagnosis not present

## 2018-09-06 DIAGNOSIS — N186 End stage renal disease: Secondary | ICD-10-CM | POA: Diagnosis not present

## 2018-09-06 DIAGNOSIS — E1129 Type 2 diabetes mellitus with other diabetic kidney complication: Secondary | ICD-10-CM | POA: Diagnosis not present

## 2018-09-06 DIAGNOSIS — N2581 Secondary hyperparathyroidism of renal origin: Secondary | ICD-10-CM | POA: Diagnosis not present

## 2018-09-06 DIAGNOSIS — Z992 Dependence on renal dialysis: Secondary | ICD-10-CM | POA: Diagnosis not present

## 2018-09-09 DIAGNOSIS — N186 End stage renal disease: Secondary | ICD-10-CM | POA: Diagnosis not present

## 2018-09-09 DIAGNOSIS — Z23 Encounter for immunization: Secondary | ICD-10-CM | POA: Diagnosis not present

## 2018-09-09 DIAGNOSIS — D631 Anemia in chronic kidney disease: Secondary | ICD-10-CM | POA: Diagnosis not present

## 2018-09-09 DIAGNOSIS — E1129 Type 2 diabetes mellitus with other diabetic kidney complication: Secondary | ICD-10-CM | POA: Diagnosis not present

## 2018-09-09 DIAGNOSIS — Z992 Dependence on renal dialysis: Secondary | ICD-10-CM | POA: Diagnosis not present

## 2018-09-09 DIAGNOSIS — N2581 Secondary hyperparathyroidism of renal origin: Secondary | ICD-10-CM | POA: Diagnosis not present

## 2018-09-11 DIAGNOSIS — D631 Anemia in chronic kidney disease: Secondary | ICD-10-CM | POA: Diagnosis not present

## 2018-09-11 DIAGNOSIS — Z23 Encounter for immunization: Secondary | ICD-10-CM | POA: Diagnosis not present

## 2018-09-11 DIAGNOSIS — N186 End stage renal disease: Secondary | ICD-10-CM | POA: Diagnosis not present

## 2018-09-11 DIAGNOSIS — N2581 Secondary hyperparathyroidism of renal origin: Secondary | ICD-10-CM | POA: Diagnosis not present

## 2018-09-11 DIAGNOSIS — Z992 Dependence on renal dialysis: Secondary | ICD-10-CM | POA: Diagnosis not present

## 2018-09-11 DIAGNOSIS — E1129 Type 2 diabetes mellitus with other diabetic kidney complication: Secondary | ICD-10-CM | POA: Diagnosis not present

## 2018-09-13 DIAGNOSIS — Z992 Dependence on renal dialysis: Secondary | ICD-10-CM | POA: Diagnosis not present

## 2018-09-13 DIAGNOSIS — Z23 Encounter for immunization: Secondary | ICD-10-CM | POA: Diagnosis not present

## 2018-09-13 DIAGNOSIS — N2581 Secondary hyperparathyroidism of renal origin: Secondary | ICD-10-CM | POA: Diagnosis not present

## 2018-09-13 DIAGNOSIS — E1129 Type 2 diabetes mellitus with other diabetic kidney complication: Secondary | ICD-10-CM | POA: Diagnosis not present

## 2018-09-13 DIAGNOSIS — N186 End stage renal disease: Secondary | ICD-10-CM | POA: Diagnosis not present

## 2018-09-13 DIAGNOSIS — D631 Anemia in chronic kidney disease: Secondary | ICD-10-CM | POA: Diagnosis not present

## 2018-09-16 DIAGNOSIS — E1129 Type 2 diabetes mellitus with other diabetic kidney complication: Secondary | ICD-10-CM | POA: Diagnosis not present

## 2018-09-16 DIAGNOSIS — Z992 Dependence on renal dialysis: Secondary | ICD-10-CM | POA: Diagnosis not present

## 2018-09-16 DIAGNOSIS — N2581 Secondary hyperparathyroidism of renal origin: Secondary | ICD-10-CM | POA: Diagnosis not present

## 2018-09-16 DIAGNOSIS — N186 End stage renal disease: Secondary | ICD-10-CM | POA: Diagnosis not present

## 2018-09-16 DIAGNOSIS — D631 Anemia in chronic kidney disease: Secondary | ICD-10-CM | POA: Diagnosis not present

## 2018-09-16 DIAGNOSIS — Z23 Encounter for immunization: Secondary | ICD-10-CM | POA: Diagnosis not present

## 2018-09-18 DIAGNOSIS — Z23 Encounter for immunization: Secondary | ICD-10-CM | POA: Diagnosis not present

## 2018-09-18 DIAGNOSIS — E1129 Type 2 diabetes mellitus with other diabetic kidney complication: Secondary | ICD-10-CM | POA: Diagnosis not present

## 2018-09-18 DIAGNOSIS — N186 End stage renal disease: Secondary | ICD-10-CM | POA: Diagnosis not present

## 2018-09-18 DIAGNOSIS — D631 Anemia in chronic kidney disease: Secondary | ICD-10-CM | POA: Diagnosis not present

## 2018-09-18 DIAGNOSIS — N2581 Secondary hyperparathyroidism of renal origin: Secondary | ICD-10-CM | POA: Diagnosis not present

## 2018-09-18 DIAGNOSIS — Z992 Dependence on renal dialysis: Secondary | ICD-10-CM | POA: Diagnosis not present

## 2018-09-20 DIAGNOSIS — N186 End stage renal disease: Secondary | ICD-10-CM | POA: Diagnosis not present

## 2018-09-20 DIAGNOSIS — D631 Anemia in chronic kidney disease: Secondary | ICD-10-CM | POA: Diagnosis not present

## 2018-09-20 DIAGNOSIS — N2581 Secondary hyperparathyroidism of renal origin: Secondary | ICD-10-CM | POA: Diagnosis not present

## 2018-09-20 DIAGNOSIS — E1129 Type 2 diabetes mellitus with other diabetic kidney complication: Secondary | ICD-10-CM | POA: Diagnosis not present

## 2018-09-20 DIAGNOSIS — Z992 Dependence on renal dialysis: Secondary | ICD-10-CM | POA: Diagnosis not present

## 2018-09-20 DIAGNOSIS — Z23 Encounter for immunization: Secondary | ICD-10-CM | POA: Diagnosis not present

## 2018-09-23 DIAGNOSIS — Z992 Dependence on renal dialysis: Secondary | ICD-10-CM | POA: Diagnosis not present

## 2018-09-23 DIAGNOSIS — E1129 Type 2 diabetes mellitus with other diabetic kidney complication: Secondary | ICD-10-CM | POA: Diagnosis not present

## 2018-09-23 DIAGNOSIS — N186 End stage renal disease: Secondary | ICD-10-CM | POA: Diagnosis not present

## 2018-09-23 DIAGNOSIS — Z23 Encounter for immunization: Secondary | ICD-10-CM | POA: Diagnosis not present

## 2018-09-23 DIAGNOSIS — N2581 Secondary hyperparathyroidism of renal origin: Secondary | ICD-10-CM | POA: Diagnosis not present

## 2018-09-23 DIAGNOSIS — D631 Anemia in chronic kidney disease: Secondary | ICD-10-CM | POA: Diagnosis not present

## 2018-09-24 DIAGNOSIS — Z992 Dependence on renal dialysis: Secondary | ICD-10-CM | POA: Diagnosis not present

## 2018-09-24 DIAGNOSIS — E1129 Type 2 diabetes mellitus with other diabetic kidney complication: Secondary | ICD-10-CM | POA: Diagnosis not present

## 2018-09-24 DIAGNOSIS — N186 End stage renal disease: Secondary | ICD-10-CM | POA: Diagnosis not present

## 2018-09-25 ENCOUNTER — Non-Acute Institutional Stay (SKILLED_NURSING_FACILITY): Payer: Medicare Other | Admitting: Adult Health

## 2018-09-25 ENCOUNTER — Encounter: Payer: Self-pay | Admitting: Adult Health

## 2018-09-25 DIAGNOSIS — E785 Hyperlipidemia, unspecified: Secondary | ICD-10-CM

## 2018-09-25 DIAGNOSIS — N2581 Secondary hyperparathyroidism of renal origin: Secondary | ICD-10-CM | POA: Diagnosis not present

## 2018-09-25 DIAGNOSIS — E1169 Type 2 diabetes mellitus with other specified complication: Secondary | ICD-10-CM

## 2018-09-25 DIAGNOSIS — E1122 Type 2 diabetes mellitus with diabetic chronic kidney disease: Secondary | ICD-10-CM | POA: Diagnosis not present

## 2018-09-25 DIAGNOSIS — Z992 Dependence on renal dialysis: Secondary | ICD-10-CM

## 2018-09-25 DIAGNOSIS — D631 Anemia in chronic kidney disease: Secondary | ICD-10-CM

## 2018-09-25 DIAGNOSIS — D509 Iron deficiency anemia, unspecified: Secondary | ICD-10-CM | POA: Diagnosis not present

## 2018-09-25 DIAGNOSIS — N186 End stage renal disease: Secondary | ICD-10-CM

## 2018-09-25 DIAGNOSIS — E1129 Type 2 diabetes mellitus with other diabetic kidney complication: Secondary | ICD-10-CM | POA: Diagnosis not present

## 2018-09-25 NOTE — Progress Notes (Signed)
Location:   [facility] Room Number: 202 A Place of Service:  SNF (31)   CODE STATUS: Full Code  No Known Allergies  Chief Complaint  Patient presents with  . Medical Management of Chronic Issues    Diabetes; dyslipidemia; esrd; anemia.     HPI:  She is a 62 year old [place] term resident of this facility being seen for the management of her chronic illnesses: diabetes; dyslipidemia; esrd; anemia. She is unable to participate in the hpi or ros. There are no nursing concerns at this time. There are no reports of changes in appetite; no indications of uncontrolled pain; no agitation present.   Past Medical History:  Diagnosis Date  . Anemia    Due to fibroids.  . Anemia in CKD (chronic kidney disease) 12/14/2014  . Benign hypertension with ESRD (end-stage renal disease) ([place]) 02/12/2018  . Bilateral leg edema    a. Chronic.  Marland Kitchen Blind   . Cardiac arrest ([place]) 12/12/2014  . CHF (congestive heart failure) ([place])   . Coronary artery disease   . Deaf    USE SIGN INTERPRETER.  Divorced from husband but lives with him. Has daughter but she does not care for her.  . Diabetes mellitus    a. Per PCP note 2012 (A1C 9.2) - pt unwilling to take meds and was educated on risk of uncontrolled DM.  b. A1C 5.9 in 11/2013.  Marland Kitchen ESRD on hemodialysis ([place])    Mon, Wed, Fri  . Essential hypertension 08/09/2009   Qualifier: Diagnosis of  By: Sarita Haver  MD, Coralyn Mark    . Fibroid uterus   . Heart murmur   . Hyperlipidemia   . Hypertension    a. Has previously refused blood pressure meds.   . Leiomyoma of uterus 08/09/2009   Qualifier: Diagnosis of  By: Sarita Haver  MD, Coralyn Mark    . MI (myocardial infarction) ([place]) 11/2014  . Moderate tricuspid regurgitation   . Osteomyelitis ([place])   . Psychiatric disorder    She frequently exhibits paranoia and has been diagnosed with psychotic d/o NOS during hospital stay in the past. She is tangetial and perseverative during her visits. She apparently has had  a bad experience with mental health in [place] in the past and refuses to discuss mental issues for fear that she will be sent back there. Her paranoia, communication issues, financial woes and lack of fam  . Pulmonary hypertension ([place])     Past Surgical History:  Procedure Laterality Date  . AMPUTATION Bilateral 04/02/2015   Procedure: Amputation Bilateral Great Toes MTP Joint;  Surgeon: Newt Minion, MD;  Location: [place];  Service: Orthopedics;  Laterality: Bilateral;  . AMPUTATION Left 05/14/2015   Procedure: Left Transmetatarsal Amputation;  Surgeon: Newt Minion, MD;  Location: [place];  Service: Orthopedics;  Laterality: Left;  . AMPUTATION Right 08/20/2015   Procedure: Transmetatarsal Amputation Right Foot;  Surgeon: Newt Minion, MD;  Location: [place];  Service: Orthopedics;  Laterality: Right;  . AV FISTULA PLACEMENT Left 01/02/2014   Procedure: ARTERIOVENOUS (AV) FISTULA CREATION;  Surgeon: Rosetta Posner, MD;  Location: [place];  Service: Vascular;  Laterality: Left;  . INSERTION OF DIALYSIS CATHETER Right 01/02/2014   Procedure: INSERTION OF DIALYSIS CATHETER;  Surgeon: Rosetta Posner, MD;  Location: [place];  Service: Vascular;  Laterality: Right;  . NO PAST SURGERIES    . ORIF PATELLA Left 05/17/2015   Procedure: OPEN REDUCTION INTERNAL (ORIF) FIXATION PATELLA;  Surgeon: Beverely Low  Fernanda Drum, MD;  Location: [place];  Service: Orthopedics;  Laterality: Left;    Social History   Socioeconomic History  . Marital status: Widowed    Spouse name: Not on file  . Number of children: Not on file  . Years of education: Not on file  . Highest education level: Not on file  Occupational History  . Not on file  Social Needs  . Financial resource strain: Not on file  . Food insecurity:    Worry: Not on file    Inability: Not on file  . Transportation needs:    Medical: Not on file    Non-medical: Not on file  Tobacco Use  . Smoking status: Never Smoker  . Smokeless tobacco: Never Used  . Tobacco comment:  Prior dip  Substance and Sexual Activity  . Alcohol use: No  . Drug use: No  . Sexual activity: Not on file  Lifestyle  . Physical activity:    Days per week: Not on file    Minutes per session: Not on file  . Stress: Not on file  Relationships  . Social connections:    Talks on phone: Not on file    Gets together: Not on file    Attends religious service: Not on file    Active member of club or organization: Not on file    Attends meetings of clubs or organizations: Not on file    Relationship status: Not on file  . Intimate partner violence:    Fear of current or ex partner: Not on file    Emotionally abused: Not on file    Physically abused: Not on file    Forced sexual activity: Not on file  Other Topics Concern  . Not on file  Social History Narrative  . Not on file   Family History  Problem Relation Age of Onset  . Other Father        Drowned from fishing?  . Heart disease Unknown       VITAL SIGNS BP (!) 169/73   Pulse 69   Temp (!) 97.2 F (36.2 C)   Resp 20   Ht 5\' 5"  (1.651 m)   Wt 147 lb 8 oz (66.9 kg)   SpO2 91%   BMI 24.55 kg/m   Outpatient Encounter Medications as of 09/25/2018  Medication Sig  . amiodarone (PACERONE) 200 MG tablet Take 200 mg by mouth every evening.   . ARIPiprazole (ABILIFY) 15 MG tablet Take 15 mg by mouth daily.   Marland Kitchen atorvastatin (LIPITOR) 10 MG tablet Take 10 mg by mouth every evening.  . calcium acetate (PHOSLO) 667 MG tablet Take 3 tablets by mouth before meals every Sunday, Tuesday, Thursday and Saturday for Dialysis.  Give 3 tablets by mouth two times a day every Mon, Wed, Friday for renal buffer  . clopidogrel (PLAVIX) 75 MG tablet Take 75 mg by mouth every evening.  Marland Kitchen Dextran 70-Hypromellose (ARTIFICIAL TEARS) 0.1-0.3 % SOLN Apply 1 drop to eye every 12 (twelve) hours as needed.  . insulin aspart (NOVOLOG) 100 UNIT/ML injection Inject as per sliding scale subcutaneously after meals 0 - 70 = 0 units, notify MD is BS is  less than 70 71 - 450 = 5 units. Notify MD if BS is greater than 450  . nitroGLYCERIN (NITROSTAT) 0.4 MG SL tablet Place 0.4 mg under the tongue every 5 (five) minutes as needed for chest pain.  . Nutritional Supplements (NUTRITIONAL SUPPLEMENT PO) Low Concentrated Sweets diet -  Regular texture, Regular / thin consistency  . Nutritional Supplements (PROMOD) LIQD Take 60 mLs by mouth 2 (two) times daily.  . polyethylene glycol (MIRALAX / GLYCOLAX) packet Take 17 g by mouth daily.   . sevelamer (RENAGEL) 800 MG tablet Give 4 tablets by mouth before meals  . traMADol (ULTRAM) 50 MG tablet Take 1 tablet (50 mg total) by mouth daily.   No facility-administered encounter medications on file as of 09/25/2018.      SIGNIFICANT DIAGNOSTIC EXAMS  PREVIOUS  11-24-14: Myoview: 1. Fixed defects involving the apex, anterior lateral wall and the inferior wall. The apex and anterior lateral wall fixed defects aresuggestive for an infarct. No evidence for reversibility or ischemia. 2. Diffuse hypokinesia and minimal wall motion along the lateral wall. 3. Left ventricular ejection fraction is 35%. 4. High-risk stress test findings*.  04-16-15: 2-d echo: - Left ventricle: The cavity size was mildly dilated. Wall thickness was increased in a pattern of mild LVH. Systolic function was moderately to severely reduced. The estimated ejection fraction was in the range of 30% to 35%. There is akinesis of the anteroseptal and apical myocardium. Features are consistent with a pseudonormal left ventricular filling pattern, with concomitant abnormal relaxation and increased filling pressure (grade 2 diastolic dysfunction). Doppler parameters are consistent with high ventricular filling pressure. - Mitral valve: Calcified annulus. There was mild regurgitation. - Left atrium: The atrium was mildly dilated. - Pulmonary arteries: Systolic pressure was mildly increased.   NO NEW EXAMS   LABS REVIEWED: she will decline  labs PREVIOUS     11-10-17: wbc 6.0; hgb 10.9; hct 34.7; mcv 88.8; plt 275; glucose 421; bun 25.3; creat 1.11; k+ 4.5; na++ 143; ca 8.6; liver normal albumin 3.1; tsh 3.07; hgb a1c 10.8; chol 170; ldl 101; trig 165; hdl 36  04-06-18: hgb a1c 5.5 04-30-18: wbc 8.2; hgb 11.5; hct 36.0; mcv 98.1; plt 257; glucose 177; bun 49; creat 8.30; k+ 5.6; na++ 134; ammonia 38; vit B 12: 401; tsh 4.13-19 chol 57; ldl 103; trig 165; hdl 50; RPR: nr 05-03-18: wbc 4.0; hgb 12.0; hct 37.4; mcv 96.1; plt 194; glucose 74; bun 60; creat 7.78; k+ 4,9; na++131; ca 8.7; phos 5.3; albumin 3.0  07-03-18: tsh 2.33  NO NEW LABS.    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 No distress.  Neck: No thyromegaly present.  Cardiovascular: Normal rate, regular rhythm and intact distal pulses.  Murmur heard. 1/6  Pulmonary/Chest: Effort normal and breath sounds normal. No respiratory distress.  Abdominal: Soft. Bowel sounds are normal. She exhibits no distension. There is no tenderness.  Musculoskeletal: She exhibits no edema.  Status post bilateral transmetatarsal amputations  Is able to move all extremities    Lymphadenopathy:    She has no cervical adenopathy.  Neurological: She is alert.  Skin: Skin is warm and dry. She is not diaphoretic.  Left upper arm a/v fistula: + thrill + bruitt         Psychiatric: She has a normal mood and affect.     ASSESSMENT/ PLAN:  TODAY:   1. 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   2. Dyslipidemia associated with type 2 diabetes mellitus:  Without change; ldl 103; will continue lipitor 10 mg  daily    3. ESRD on hemodialysis: is followed by nephrology. Will continue dialysis as scheduled. Will continue phoslo 667 mg 3 tabs three times daily on  Sunday; Tuesday; Thursday; Saturday; give 2 tabs three times daily on Monday; Wednesday; Friday and is taking renvela 800 mg three tabs three times daily    4. Anemia in CKD on chronic dialysis: stable  hgb 12.0 will monitor   PREVIOUS   5. Benign hypertension with ESRD and hypertensive heart disease with chronic combined heart failure : b/p 169/73 is presently stable   6. PAD: is without change is status post left and right transmetatarsal amputation: will continue plavix 75 mg daily  Will continue ultram 50 mg daily for pain management   7. Protein calorie malnutrition: is stable will continue supplements as directed and prostat 60 cc twice daily   8. Chronic combined systolic and diastolic heart failure: has ischemic cardiomyopathy  EF is 30-35%  (04-16-15); is presently stable will continue to monitor   9. Sustained ventricular trachycardia: not a candidate for ICD. Is on amiodarone 200 mg daily for heart rate control will monitor   10. 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  She has not tolerated lower dose of this medication in the past.   11. Chronic constipation: is stable will continue miralax daily     MD is aware of resident's narcotic use and is in agreement with current plan of care. We will attempt to wean resident as apropriate   Ok Edwards NP [facility]  Contact [phone] Monday through Friday 8am- 5pm  After hours call [phone]

## 2018-09-27 DIAGNOSIS — Z992 Dependence on renal dialysis: Secondary | ICD-10-CM | POA: Diagnosis not present

## 2018-09-27 DIAGNOSIS — N2581 Secondary hyperparathyroidism of renal origin: Secondary | ICD-10-CM | POA: Diagnosis not present

## 2018-09-27 DIAGNOSIS — N186 End stage renal disease: Secondary | ICD-10-CM | POA: Diagnosis not present

## 2018-09-27 DIAGNOSIS — D631 Anemia in chronic kidney disease: Secondary | ICD-10-CM | POA: Diagnosis not present

## 2018-09-27 DIAGNOSIS — D509 Iron deficiency anemia, unspecified: Secondary | ICD-10-CM | POA: Diagnosis not present

## 2018-09-27 DIAGNOSIS — E1129 Type 2 diabetes mellitus with other diabetic kidney complication: Secondary | ICD-10-CM | POA: Diagnosis not present

## 2018-09-30 DIAGNOSIS — N2581 Secondary hyperparathyroidism of renal origin: Secondary | ICD-10-CM | POA: Diagnosis not present

## 2018-09-30 DIAGNOSIS — D509 Iron deficiency anemia, unspecified: Secondary | ICD-10-CM | POA: Diagnosis not present

## 2018-09-30 DIAGNOSIS — E1129 Type 2 diabetes mellitus with other diabetic kidney complication: Secondary | ICD-10-CM | POA: Diagnosis not present

## 2018-09-30 DIAGNOSIS — N186 End stage renal disease: Secondary | ICD-10-CM | POA: Diagnosis not present

## 2018-09-30 DIAGNOSIS — Z992 Dependence on renal dialysis: Secondary | ICD-10-CM | POA: Diagnosis not present

## 2018-09-30 DIAGNOSIS — D631 Anemia in chronic kidney disease: Secondary | ICD-10-CM | POA: Diagnosis not present

## 2018-10-02 DIAGNOSIS — E1129 Type 2 diabetes mellitus with other diabetic kidney complication: Secondary | ICD-10-CM | POA: Diagnosis not present

## 2018-10-02 DIAGNOSIS — N186 End stage renal disease: Secondary | ICD-10-CM | POA: Diagnosis not present

## 2018-10-02 DIAGNOSIS — D509 Iron deficiency anemia, unspecified: Secondary | ICD-10-CM | POA: Diagnosis not present

## 2018-10-02 DIAGNOSIS — Z992 Dependence on renal dialysis: Secondary | ICD-10-CM | POA: Diagnosis not present

## 2018-10-02 DIAGNOSIS — N2581 Secondary hyperparathyroidism of renal origin: Secondary | ICD-10-CM | POA: Diagnosis not present

## 2018-10-02 DIAGNOSIS — D631 Anemia in chronic kidney disease: Secondary | ICD-10-CM | POA: Diagnosis not present

## 2018-10-03 DIAGNOSIS — F200 Paranoid schizophrenia: Secondary | ICD-10-CM | POA: Diagnosis not present

## 2018-10-04 DIAGNOSIS — D509 Iron deficiency anemia, unspecified: Secondary | ICD-10-CM | POA: Diagnosis not present

## 2018-10-04 DIAGNOSIS — E1129 Type 2 diabetes mellitus with other diabetic kidney complication: Secondary | ICD-10-CM | POA: Diagnosis not present

## 2018-10-04 DIAGNOSIS — Z992 Dependence on renal dialysis: Secondary | ICD-10-CM | POA: Diagnosis not present

## 2018-10-04 DIAGNOSIS — N2581 Secondary hyperparathyroidism of renal origin: Secondary | ICD-10-CM | POA: Diagnosis not present

## 2018-10-04 DIAGNOSIS — N186 End stage renal disease: Secondary | ICD-10-CM | POA: Diagnosis not present

## 2018-10-04 DIAGNOSIS — D631 Anemia in chronic kidney disease: Secondary | ICD-10-CM | POA: Diagnosis not present

## 2018-10-07 DIAGNOSIS — D509 Iron deficiency anemia, unspecified: Secondary | ICD-10-CM | POA: Diagnosis not present

## 2018-10-07 DIAGNOSIS — D631 Anemia in chronic kidney disease: Secondary | ICD-10-CM | POA: Diagnosis not present

## 2018-10-07 DIAGNOSIS — N186 End stage renal disease: Secondary | ICD-10-CM | POA: Diagnosis not present

## 2018-10-07 DIAGNOSIS — Z992 Dependence on renal dialysis: Secondary | ICD-10-CM | POA: Diagnosis not present

## 2018-10-07 DIAGNOSIS — N2581 Secondary hyperparathyroidism of renal origin: Secondary | ICD-10-CM | POA: Diagnosis not present

## 2018-10-07 DIAGNOSIS — E1129 Type 2 diabetes mellitus with other diabetic kidney complication: Secondary | ICD-10-CM | POA: Diagnosis not present

## 2018-10-09 DIAGNOSIS — D509 Iron deficiency anemia, unspecified: Secondary | ICD-10-CM | POA: Diagnosis not present

## 2018-10-09 DIAGNOSIS — N2581 Secondary hyperparathyroidism of renal origin: Secondary | ICD-10-CM | POA: Diagnosis not present

## 2018-10-09 DIAGNOSIS — E1129 Type 2 diabetes mellitus with other diabetic kidney complication: Secondary | ICD-10-CM | POA: Diagnosis not present

## 2018-10-09 DIAGNOSIS — D631 Anemia in chronic kidney disease: Secondary | ICD-10-CM | POA: Diagnosis not present

## 2018-10-09 DIAGNOSIS — N186 End stage renal disease: Secondary | ICD-10-CM | POA: Diagnosis not present

## 2018-10-09 DIAGNOSIS — Z992 Dependence on renal dialysis: Secondary | ICD-10-CM | POA: Diagnosis not present

## 2018-10-11 DIAGNOSIS — N186 End stage renal disease: Secondary | ICD-10-CM | POA: Diagnosis not present

## 2018-10-11 DIAGNOSIS — E1129 Type 2 diabetes mellitus with other diabetic kidney complication: Secondary | ICD-10-CM | POA: Diagnosis not present

## 2018-10-11 DIAGNOSIS — D509 Iron deficiency anemia, unspecified: Secondary | ICD-10-CM | POA: Diagnosis not present

## 2018-10-11 DIAGNOSIS — D631 Anemia in chronic kidney disease: Secondary | ICD-10-CM | POA: Diagnosis not present

## 2018-10-11 DIAGNOSIS — Z992 Dependence on renal dialysis: Secondary | ICD-10-CM | POA: Diagnosis not present

## 2018-10-11 DIAGNOSIS — N2581 Secondary hyperparathyroidism of renal origin: Secondary | ICD-10-CM | POA: Diagnosis not present

## 2018-10-12 DIAGNOSIS — Z23 Encounter for immunization: Secondary | ICD-10-CM | POA: Diagnosis not present

## 2018-10-14 ENCOUNTER — Other Ambulatory Visit: Payer: Self-pay

## 2018-10-14 DIAGNOSIS — E1129 Type 2 diabetes mellitus with other diabetic kidney complication: Secondary | ICD-10-CM | POA: Diagnosis not present

## 2018-10-14 DIAGNOSIS — N2581 Secondary hyperparathyroidism of renal origin: Secondary | ICD-10-CM | POA: Diagnosis not present

## 2018-10-14 DIAGNOSIS — Z992 Dependence on renal dialysis: Secondary | ICD-10-CM | POA: Diagnosis not present

## 2018-10-14 DIAGNOSIS — D631 Anemia in chronic kidney disease: Secondary | ICD-10-CM | POA: Diagnosis not present

## 2018-10-14 DIAGNOSIS — D509 Iron deficiency anemia, unspecified: Secondary | ICD-10-CM | POA: Diagnosis not present

## 2018-10-14 DIAGNOSIS — N186 End stage renal disease: Secondary | ICD-10-CM | POA: Diagnosis not present

## 2018-10-14 MED ORDER — TRAMADOL HCL 50 MG PO TABS: 50.0000 mg | ORAL_TABLET | Freq: Every day | ORAL | 0 refills | Status: DC

## 2018-10-14 NOTE — Telephone Encounter (Signed)
Rx faxed to [facility] (P) [phone], (F) [phone] 

## 2018-10-16 DIAGNOSIS — D631 Anemia in chronic kidney disease: Secondary | ICD-10-CM | POA: Diagnosis not present

## 2018-10-16 DIAGNOSIS — Z992 Dependence on renal dialysis: Secondary | ICD-10-CM | POA: Diagnosis not present

## 2018-10-16 DIAGNOSIS — E1129 Type 2 diabetes mellitus with other diabetic kidney complication: Secondary | ICD-10-CM | POA: Diagnosis not present

## 2018-10-16 DIAGNOSIS — N186 End stage renal disease: Secondary | ICD-10-CM | POA: Diagnosis not present

## 2018-10-16 DIAGNOSIS — D509 Iron deficiency anemia, unspecified: Secondary | ICD-10-CM | POA: Diagnosis not present

## 2018-10-16 DIAGNOSIS — N2581 Secondary hyperparathyroidism of renal origin: Secondary | ICD-10-CM | POA: Diagnosis not present

## 2018-10-18 DIAGNOSIS — N186 End stage renal disease: Secondary | ICD-10-CM | POA: Diagnosis not present

## 2018-10-18 DIAGNOSIS — D509 Iron deficiency anemia, unspecified: Secondary | ICD-10-CM | POA: Diagnosis not present

## 2018-10-18 DIAGNOSIS — Z992 Dependence on renal dialysis: Secondary | ICD-10-CM | POA: Diagnosis not present

## 2018-10-18 DIAGNOSIS — N2581 Secondary hyperparathyroidism of renal origin: Secondary | ICD-10-CM | POA: Diagnosis not present

## 2018-10-18 DIAGNOSIS — D631 Anemia in chronic kidney disease: Secondary | ICD-10-CM | POA: Diagnosis not present

## 2018-10-18 DIAGNOSIS — E1129 Type 2 diabetes mellitus with other diabetic kidney complication: Secondary | ICD-10-CM | POA: Diagnosis not present

## 2018-10-21 DIAGNOSIS — E1129 Type 2 diabetes mellitus with other diabetic kidney complication: Secondary | ICD-10-CM | POA: Diagnosis not present

## 2018-10-21 DIAGNOSIS — Z992 Dependence on renal dialysis: Secondary | ICD-10-CM | POA: Diagnosis not present

## 2018-10-21 DIAGNOSIS — N186 End stage renal disease: Secondary | ICD-10-CM | POA: Diagnosis not present

## 2018-10-21 DIAGNOSIS — D509 Iron deficiency anemia, unspecified: Secondary | ICD-10-CM | POA: Diagnosis not present

## 2018-10-21 DIAGNOSIS — D631 Anemia in chronic kidney disease: Secondary | ICD-10-CM | POA: Diagnosis not present

## 2018-10-21 DIAGNOSIS — N2581 Secondary hyperparathyroidism of renal origin: Secondary | ICD-10-CM | POA: Diagnosis not present

## 2018-10-23 DIAGNOSIS — N2581 Secondary hyperparathyroidism of renal origin: Secondary | ICD-10-CM | POA: Diagnosis not present

## 2018-10-23 DIAGNOSIS — D631 Anemia in chronic kidney disease: Secondary | ICD-10-CM | POA: Diagnosis not present

## 2018-10-23 DIAGNOSIS — Z992 Dependence on renal dialysis: Secondary | ICD-10-CM | POA: Diagnosis not present

## 2018-10-23 DIAGNOSIS — D509 Iron deficiency anemia, unspecified: Secondary | ICD-10-CM | POA: Diagnosis not present

## 2018-10-23 DIAGNOSIS — N186 End stage renal disease: Secondary | ICD-10-CM | POA: Diagnosis not present

## 2018-10-23 DIAGNOSIS — E1129 Type 2 diabetes mellitus with other diabetic kidney complication: Secondary | ICD-10-CM | POA: Diagnosis not present

## 2018-10-25 DIAGNOSIS — N186 End stage renal disease: Secondary | ICD-10-CM | POA: Diagnosis not present

## 2018-10-25 DIAGNOSIS — N2581 Secondary hyperparathyroidism of renal origin: Secondary | ICD-10-CM | POA: Diagnosis not present

## 2018-10-25 DIAGNOSIS — E1129 Type 2 diabetes mellitus with other diabetic kidney complication: Secondary | ICD-10-CM | POA: Diagnosis not present

## 2018-10-25 DIAGNOSIS — Z992 Dependence on renal dialysis: Secondary | ICD-10-CM | POA: Diagnosis not present

## 2018-10-25 DIAGNOSIS — D509 Iron deficiency anemia, unspecified: Secondary | ICD-10-CM | POA: Diagnosis not present

## 2018-10-28 DIAGNOSIS — Z992 Dependence on renal dialysis: Secondary | ICD-10-CM | POA: Diagnosis not present

## 2018-10-28 DIAGNOSIS — N186 End stage renal disease: Secondary | ICD-10-CM | POA: Diagnosis not present

## 2018-10-28 DIAGNOSIS — N2581 Secondary hyperparathyroidism of renal origin: Secondary | ICD-10-CM | POA: Diagnosis not present

## 2018-10-28 DIAGNOSIS — D509 Iron deficiency anemia, unspecified: Secondary | ICD-10-CM | POA: Diagnosis not present

## 2018-10-28 DIAGNOSIS — E1129 Type 2 diabetes mellitus with other diabetic kidney complication: Secondary | ICD-10-CM | POA: Diagnosis not present

## 2018-10-30 DIAGNOSIS — N2581 Secondary hyperparathyroidism of renal origin: Secondary | ICD-10-CM | POA: Diagnosis not present

## 2018-10-30 DIAGNOSIS — E1129 Type 2 diabetes mellitus with other diabetic kidney complication: Secondary | ICD-10-CM | POA: Diagnosis not present

## 2018-10-30 DIAGNOSIS — Z992 Dependence on renal dialysis: Secondary | ICD-10-CM | POA: Diagnosis not present

## 2018-10-30 DIAGNOSIS — D509 Iron deficiency anemia, unspecified: Secondary | ICD-10-CM | POA: Diagnosis not present

## 2018-10-30 DIAGNOSIS — N186 End stage renal disease: Secondary | ICD-10-CM | POA: Diagnosis not present

## 2018-11-01 DIAGNOSIS — E1129 Type 2 diabetes mellitus with other diabetic kidney complication: Secondary | ICD-10-CM | POA: Diagnosis not present

## 2018-11-01 DIAGNOSIS — D509 Iron deficiency anemia, unspecified: Secondary | ICD-10-CM | POA: Diagnosis not present

## 2018-11-01 DIAGNOSIS — Z992 Dependence on renal dialysis: Secondary | ICD-10-CM | POA: Diagnosis not present

## 2018-11-01 DIAGNOSIS — N2581 Secondary hyperparathyroidism of renal origin: Secondary | ICD-10-CM | POA: Diagnosis not present

## 2018-11-01 DIAGNOSIS — N186 End stage renal disease: Secondary | ICD-10-CM | POA: Diagnosis not present

## 2018-11-04 DIAGNOSIS — N2581 Secondary hyperparathyroidism of renal origin: Secondary | ICD-10-CM | POA: Diagnosis not present

## 2018-11-04 DIAGNOSIS — E1129 Type 2 diabetes mellitus with other diabetic kidney complication: Secondary | ICD-10-CM | POA: Diagnosis not present

## 2018-11-04 DIAGNOSIS — N186 End stage renal disease: Secondary | ICD-10-CM | POA: Diagnosis not present

## 2018-11-04 DIAGNOSIS — D509 Iron deficiency anemia, unspecified: Secondary | ICD-10-CM | POA: Diagnosis not present

## 2018-11-04 DIAGNOSIS — Z992 Dependence on renal dialysis: Secondary | ICD-10-CM | POA: Diagnosis not present

## 2018-11-06 DIAGNOSIS — E1129 Type 2 diabetes mellitus with other diabetic kidney complication: Secondary | ICD-10-CM | POA: Diagnosis not present

## 2018-11-06 DIAGNOSIS — D509 Iron deficiency anemia, unspecified: Secondary | ICD-10-CM | POA: Diagnosis not present

## 2018-11-06 DIAGNOSIS — N2581 Secondary hyperparathyroidism of renal origin: Secondary | ICD-10-CM | POA: Diagnosis not present

## 2018-11-06 DIAGNOSIS — Z992 Dependence on renal dialysis: Secondary | ICD-10-CM | POA: Diagnosis not present

## 2018-11-06 DIAGNOSIS — N186 End stage renal disease: Secondary | ICD-10-CM | POA: Diagnosis not present

## 2018-11-08 DIAGNOSIS — D509 Iron deficiency anemia, unspecified: Secondary | ICD-10-CM | POA: Diagnosis not present

## 2018-11-08 DIAGNOSIS — E1129 Type 2 diabetes mellitus with other diabetic kidney complication: Secondary | ICD-10-CM | POA: Diagnosis not present

## 2018-11-08 DIAGNOSIS — N186 End stage renal disease: Secondary | ICD-10-CM | POA: Diagnosis not present

## 2018-11-08 DIAGNOSIS — N2581 Secondary hyperparathyroidism of renal origin: Secondary | ICD-10-CM | POA: Diagnosis not present

## 2018-11-08 DIAGNOSIS — Z992 Dependence on renal dialysis: Secondary | ICD-10-CM | POA: Diagnosis not present

## 2018-11-08 DIAGNOSIS — R319 Hematuria, unspecified: Secondary | ICD-10-CM | POA: Diagnosis not present

## 2018-11-08 DIAGNOSIS — N390 Urinary tract infection, site not specified: Secondary | ICD-10-CM | POA: Diagnosis not present

## 2018-11-11 DIAGNOSIS — N186 End stage renal disease: Secondary | ICD-10-CM | POA: Diagnosis not present

## 2018-11-11 DIAGNOSIS — E1129 Type 2 diabetes mellitus with other diabetic kidney complication: Secondary | ICD-10-CM | POA: Diagnosis not present

## 2018-11-11 DIAGNOSIS — D509 Iron deficiency anemia, unspecified: Secondary | ICD-10-CM | POA: Diagnosis not present

## 2018-11-11 DIAGNOSIS — Z992 Dependence on renal dialysis: Secondary | ICD-10-CM | POA: Diagnosis not present

## 2018-11-11 DIAGNOSIS — N2581 Secondary hyperparathyroidism of renal origin: Secondary | ICD-10-CM | POA: Diagnosis not present

## 2018-11-13 DIAGNOSIS — E1129 Type 2 diabetes mellitus with other diabetic kidney complication: Secondary | ICD-10-CM | POA: Diagnosis not present

## 2018-11-13 DIAGNOSIS — Z992 Dependence on renal dialysis: Secondary | ICD-10-CM | POA: Diagnosis not present

## 2018-11-13 DIAGNOSIS — N2581 Secondary hyperparathyroidism of renal origin: Secondary | ICD-10-CM | POA: Diagnosis not present

## 2018-11-13 DIAGNOSIS — N186 End stage renal disease: Secondary | ICD-10-CM | POA: Diagnosis not present

## 2018-11-13 DIAGNOSIS — D509 Iron deficiency anemia, unspecified: Secondary | ICD-10-CM | POA: Diagnosis not present

## 2018-11-14 DIAGNOSIS — E1122 Type 2 diabetes mellitus with diabetic chronic kidney disease: Secondary | ICD-10-CM | POA: Diagnosis not present

## 2018-11-14 DIAGNOSIS — N186 End stage renal disease: Secondary | ICD-10-CM | POA: Diagnosis not present

## 2018-11-14 DIAGNOSIS — I5040 Unspecified combined systolic (congestive) and diastolic (congestive) heart failure: Secondary | ICD-10-CM | POA: Diagnosis not present

## 2018-11-14 DIAGNOSIS — I10 Essential (primary) hypertension: Secondary | ICD-10-CM | POA: Diagnosis not present

## 2018-11-15 DIAGNOSIS — E1129 Type 2 diabetes mellitus with other diabetic kidney complication: Secondary | ICD-10-CM | POA: Diagnosis not present

## 2018-11-15 DIAGNOSIS — N186 End stage renal disease: Secondary | ICD-10-CM | POA: Diagnosis not present

## 2018-11-15 DIAGNOSIS — Z992 Dependence on renal dialysis: Secondary | ICD-10-CM | POA: Diagnosis not present

## 2018-11-15 DIAGNOSIS — N2581 Secondary hyperparathyroidism of renal origin: Secondary | ICD-10-CM | POA: Diagnosis not present

## 2018-11-15 DIAGNOSIS — D509 Iron deficiency anemia, unspecified: Secondary | ICD-10-CM | POA: Diagnosis not present

## 2018-11-17 DIAGNOSIS — E1129 Type 2 diabetes mellitus with other diabetic kidney complication: Secondary | ICD-10-CM | POA: Diagnosis not present

## 2018-11-17 DIAGNOSIS — Z992 Dependence on renal dialysis: Secondary | ICD-10-CM | POA: Diagnosis not present

## 2018-11-17 DIAGNOSIS — N2581 Secondary hyperparathyroidism of renal origin: Secondary | ICD-10-CM | POA: Diagnosis not present

## 2018-11-17 DIAGNOSIS — D509 Iron deficiency anemia, unspecified: Secondary | ICD-10-CM | POA: Diagnosis not present

## 2018-11-17 DIAGNOSIS — N186 End stage renal disease: Secondary | ICD-10-CM | POA: Diagnosis not present

## 2018-11-19 DIAGNOSIS — D509 Iron deficiency anemia, unspecified: Secondary | ICD-10-CM | POA: Diagnosis not present

## 2018-11-19 DIAGNOSIS — N2581 Secondary hyperparathyroidism of renal origin: Secondary | ICD-10-CM | POA: Diagnosis not present

## 2018-11-19 DIAGNOSIS — E1129 Type 2 diabetes mellitus with other diabetic kidney complication: Secondary | ICD-10-CM | POA: Diagnosis not present

## 2018-11-19 DIAGNOSIS — N186 End stage renal disease: Secondary | ICD-10-CM | POA: Diagnosis not present

## 2018-11-19 DIAGNOSIS — Z992 Dependence on renal dialysis: Secondary | ICD-10-CM | POA: Diagnosis not present

## 2018-11-20 DIAGNOSIS — E785 Hyperlipidemia, unspecified: Secondary | ICD-10-CM | POA: Diagnosis not present

## 2018-11-20 DIAGNOSIS — E1122 Type 2 diabetes mellitus with diabetic chronic kidney disease: Secondary | ICD-10-CM | POA: Diagnosis not present

## 2018-11-20 DIAGNOSIS — I10 Essential (primary) hypertension: Secondary | ICD-10-CM | POA: Diagnosis not present

## 2018-11-22 DIAGNOSIS — D509 Iron deficiency anemia, unspecified: Secondary | ICD-10-CM | POA: Diagnosis not present

## 2018-11-22 DIAGNOSIS — Z992 Dependence on renal dialysis: Secondary | ICD-10-CM | POA: Diagnosis not present

## 2018-11-22 DIAGNOSIS — N186 End stage renal disease: Secondary | ICD-10-CM | POA: Diagnosis not present

## 2018-11-22 DIAGNOSIS — E1129 Type 2 diabetes mellitus with other diabetic kidney complication: Secondary | ICD-10-CM | POA: Diagnosis not present

## 2018-11-22 DIAGNOSIS — N2581 Secondary hyperparathyroidism of renal origin: Secondary | ICD-10-CM | POA: Diagnosis not present

## 2018-11-23 DIAGNOSIS — E1129 Type 2 diabetes mellitus with other diabetic kidney complication: Secondary | ICD-10-CM | POA: Diagnosis not present

## 2018-11-23 DIAGNOSIS — Z992 Dependence on renal dialysis: Secondary | ICD-10-CM | POA: Diagnosis not present

## 2018-11-23 DIAGNOSIS — N186 End stage renal disease: Secondary | ICD-10-CM | POA: Diagnosis not present

## 2018-11-24 DIAGNOSIS — Z992 Dependence on renal dialysis: Secondary | ICD-10-CM | POA: Diagnosis not present

## 2018-11-24 DIAGNOSIS — E1129 Type 2 diabetes mellitus with other diabetic kidney complication: Secondary | ICD-10-CM | POA: Diagnosis not present

## 2018-11-24 DIAGNOSIS — N186 End stage renal disease: Secondary | ICD-10-CM | POA: Diagnosis not present

## 2018-11-25 DIAGNOSIS — E1129 Type 2 diabetes mellitus with other diabetic kidney complication: Secondary | ICD-10-CM | POA: Diagnosis not present

## 2018-11-25 DIAGNOSIS — D649 Anemia, unspecified: Secondary | ICD-10-CM | POA: Diagnosis not present

## 2018-11-25 DIAGNOSIS — D631 Anemia in chronic kidney disease: Secondary | ICD-10-CM | POA: Diagnosis not present

## 2018-11-25 DIAGNOSIS — D509 Iron deficiency anemia, unspecified: Secondary | ICD-10-CM | POA: Diagnosis not present

## 2018-11-25 DIAGNOSIS — N2581 Secondary hyperparathyroidism of renal origin: Secondary | ICD-10-CM | POA: Diagnosis not present

## 2018-11-25 DIAGNOSIS — E785 Hyperlipidemia, unspecified: Secondary | ICD-10-CM | POA: Diagnosis not present

## 2018-11-25 DIAGNOSIS — Z992 Dependence on renal dialysis: Secondary | ICD-10-CM | POA: Diagnosis not present

## 2018-11-25 DIAGNOSIS — E119 Type 2 diabetes mellitus without complications: Secondary | ICD-10-CM | POA: Diagnosis not present

## 2018-11-25 DIAGNOSIS — Z79899 Other long term (current) drug therapy: Secondary | ICD-10-CM | POA: Diagnosis not present

## 2018-11-25 DIAGNOSIS — N186 End stage renal disease: Secondary | ICD-10-CM | POA: Diagnosis not present

## 2018-11-25 DIAGNOSIS — E559 Vitamin D deficiency, unspecified: Secondary | ICD-10-CM | POA: Diagnosis not present

## 2018-11-25 DIAGNOSIS — E039 Hypothyroidism, unspecified: Secondary | ICD-10-CM | POA: Diagnosis not present

## 2018-11-27 DIAGNOSIS — N186 End stage renal disease: Secondary | ICD-10-CM | POA: Diagnosis not present

## 2018-11-27 DIAGNOSIS — D509 Iron deficiency anemia, unspecified: Secondary | ICD-10-CM | POA: Diagnosis not present

## 2018-11-27 DIAGNOSIS — E1129 Type 2 diabetes mellitus with other diabetic kidney complication: Secondary | ICD-10-CM | POA: Diagnosis not present

## 2018-11-27 DIAGNOSIS — D631 Anemia in chronic kidney disease: Secondary | ICD-10-CM | POA: Diagnosis not present

## 2018-11-27 DIAGNOSIS — Z992 Dependence on renal dialysis: Secondary | ICD-10-CM | POA: Diagnosis not present

## 2018-11-27 DIAGNOSIS — N2581 Secondary hyperparathyroidism of renal origin: Secondary | ICD-10-CM | POA: Diagnosis not present

## 2018-11-28 DIAGNOSIS — D649 Anemia, unspecified: Secondary | ICD-10-CM | POA: Diagnosis not present

## 2018-11-28 DIAGNOSIS — E1122 Type 2 diabetes mellitus with diabetic chronic kidney disease: Secondary | ICD-10-CM | POA: Diagnosis not present

## 2018-11-28 DIAGNOSIS — I10 Essential (primary) hypertension: Secondary | ICD-10-CM | POA: Diagnosis not present

## 2018-11-29 DIAGNOSIS — E1129 Type 2 diabetes mellitus with other diabetic kidney complication: Secondary | ICD-10-CM | POA: Diagnosis not present

## 2018-11-29 DIAGNOSIS — N186 End stage renal disease: Secondary | ICD-10-CM | POA: Diagnosis not present

## 2018-11-29 DIAGNOSIS — D509 Iron deficiency anemia, unspecified: Secondary | ICD-10-CM | POA: Diagnosis not present

## 2018-11-29 DIAGNOSIS — D631 Anemia in chronic kidney disease: Secondary | ICD-10-CM | POA: Diagnosis not present

## 2018-11-29 DIAGNOSIS — N2581 Secondary hyperparathyroidism of renal origin: Secondary | ICD-10-CM | POA: Diagnosis not present

## 2018-11-29 DIAGNOSIS — Z992 Dependence on renal dialysis: Secondary | ICD-10-CM | POA: Diagnosis not present

## 2018-12-02 DIAGNOSIS — D509 Iron deficiency anemia, unspecified: Secondary | ICD-10-CM | POA: Diagnosis not present

## 2018-12-02 DIAGNOSIS — N186 End stage renal disease: Secondary | ICD-10-CM | POA: Diagnosis not present

## 2018-12-02 DIAGNOSIS — N2581 Secondary hyperparathyroidism of renal origin: Secondary | ICD-10-CM | POA: Diagnosis not present

## 2018-12-02 DIAGNOSIS — Z992 Dependence on renal dialysis: Secondary | ICD-10-CM | POA: Diagnosis not present

## 2018-12-02 DIAGNOSIS — E1129 Type 2 diabetes mellitus with other diabetic kidney complication: Secondary | ICD-10-CM | POA: Diagnosis not present

## 2018-12-02 DIAGNOSIS — D631 Anemia in chronic kidney disease: Secondary | ICD-10-CM | POA: Diagnosis not present

## 2018-12-04 DIAGNOSIS — E1129 Type 2 diabetes mellitus with other diabetic kidney complication: Secondary | ICD-10-CM | POA: Diagnosis not present

## 2018-12-04 DIAGNOSIS — Z992 Dependence on renal dialysis: Secondary | ICD-10-CM | POA: Diagnosis not present

## 2018-12-04 DIAGNOSIS — N186 End stage renal disease: Secondary | ICD-10-CM | POA: Diagnosis not present

## 2018-12-04 DIAGNOSIS — N2581 Secondary hyperparathyroidism of renal origin: Secondary | ICD-10-CM | POA: Diagnosis not present

## 2018-12-04 DIAGNOSIS — D509 Iron deficiency anemia, unspecified: Secondary | ICD-10-CM | POA: Diagnosis not present

## 2018-12-04 DIAGNOSIS — D631 Anemia in chronic kidney disease: Secondary | ICD-10-CM | POA: Diagnosis not present

## 2018-12-06 DIAGNOSIS — D509 Iron deficiency anemia, unspecified: Secondary | ICD-10-CM | POA: Diagnosis not present

## 2018-12-06 DIAGNOSIS — Z992 Dependence on renal dialysis: Secondary | ICD-10-CM | POA: Diagnosis not present

## 2018-12-06 DIAGNOSIS — N2581 Secondary hyperparathyroidism of renal origin: Secondary | ICD-10-CM | POA: Diagnosis not present

## 2018-12-06 DIAGNOSIS — N186 End stage renal disease: Secondary | ICD-10-CM | POA: Diagnosis not present

## 2018-12-06 DIAGNOSIS — E1129 Type 2 diabetes mellitus with other diabetic kidney complication: Secondary | ICD-10-CM | POA: Diagnosis not present

## 2018-12-06 DIAGNOSIS — D631 Anemia in chronic kidney disease: Secondary | ICD-10-CM | POA: Diagnosis not present

## 2018-12-09 DIAGNOSIS — N186 End stage renal disease: Secondary | ICD-10-CM | POA: Diagnosis not present

## 2018-12-09 DIAGNOSIS — E1129 Type 2 diabetes mellitus with other diabetic kidney complication: Secondary | ICD-10-CM | POA: Diagnosis not present

## 2018-12-09 DIAGNOSIS — D631 Anemia in chronic kidney disease: Secondary | ICD-10-CM | POA: Diagnosis not present

## 2018-12-09 DIAGNOSIS — D509 Iron deficiency anemia, unspecified: Secondary | ICD-10-CM | POA: Diagnosis not present

## 2018-12-09 DIAGNOSIS — N2581 Secondary hyperparathyroidism of renal origin: Secondary | ICD-10-CM | POA: Diagnosis not present

## 2018-12-09 DIAGNOSIS — Z992 Dependence on renal dialysis: Secondary | ICD-10-CM | POA: Diagnosis not present

## 2018-12-10 DIAGNOSIS — F200 Paranoid schizophrenia: Secondary | ICD-10-CM | POA: Diagnosis not present

## 2018-12-11 DIAGNOSIS — D509 Iron deficiency anemia, unspecified: Secondary | ICD-10-CM | POA: Diagnosis not present

## 2018-12-11 DIAGNOSIS — N2581 Secondary hyperparathyroidism of renal origin: Secondary | ICD-10-CM | POA: Diagnosis not present

## 2018-12-11 DIAGNOSIS — E1129 Type 2 diabetes mellitus with other diabetic kidney complication: Secondary | ICD-10-CM | POA: Diagnosis not present

## 2018-12-11 DIAGNOSIS — N186 End stage renal disease: Secondary | ICD-10-CM | POA: Diagnosis not present

## 2018-12-11 DIAGNOSIS — D631 Anemia in chronic kidney disease: Secondary | ICD-10-CM | POA: Diagnosis not present

## 2018-12-11 DIAGNOSIS — Z992 Dependence on renal dialysis: Secondary | ICD-10-CM | POA: Diagnosis not present

## 2018-12-13 DIAGNOSIS — D631 Anemia in chronic kidney disease: Secondary | ICD-10-CM | POA: Diagnosis not present

## 2018-12-13 DIAGNOSIS — Z992 Dependence on renal dialysis: Secondary | ICD-10-CM | POA: Diagnosis not present

## 2018-12-13 DIAGNOSIS — N186 End stage renal disease: Secondary | ICD-10-CM | POA: Diagnosis not present

## 2018-12-13 DIAGNOSIS — N2581 Secondary hyperparathyroidism of renal origin: Secondary | ICD-10-CM | POA: Diagnosis not present

## 2018-12-13 DIAGNOSIS — D509 Iron deficiency anemia, unspecified: Secondary | ICD-10-CM | POA: Diagnosis not present

## 2018-12-13 DIAGNOSIS — E1129 Type 2 diabetes mellitus with other diabetic kidney complication: Secondary | ICD-10-CM | POA: Diagnosis not present

## 2018-12-15 DIAGNOSIS — D631 Anemia in chronic kidney disease: Secondary | ICD-10-CM | POA: Diagnosis not present

## 2018-12-15 DIAGNOSIS — N2581 Secondary hyperparathyroidism of renal origin: Secondary | ICD-10-CM | POA: Diagnosis not present

## 2018-12-15 DIAGNOSIS — N186 End stage renal disease: Secondary | ICD-10-CM | POA: Diagnosis not present

## 2018-12-15 DIAGNOSIS — Z992 Dependence on renal dialysis: Secondary | ICD-10-CM | POA: Diagnosis not present

## 2018-12-15 DIAGNOSIS — D509 Iron deficiency anemia, unspecified: Secondary | ICD-10-CM | POA: Diagnosis not present

## 2018-12-15 DIAGNOSIS — E1129 Type 2 diabetes mellitus with other diabetic kidney complication: Secondary | ICD-10-CM | POA: Diagnosis not present

## 2018-12-17 DIAGNOSIS — D631 Anemia in chronic kidney disease: Secondary | ICD-10-CM | POA: Diagnosis not present

## 2018-12-17 DIAGNOSIS — Z992 Dependence on renal dialysis: Secondary | ICD-10-CM | POA: Diagnosis not present

## 2018-12-17 DIAGNOSIS — E1129 Type 2 diabetes mellitus with other diabetic kidney complication: Secondary | ICD-10-CM | POA: Diagnosis not present

## 2018-12-17 DIAGNOSIS — N186 End stage renal disease: Secondary | ICD-10-CM | POA: Diagnosis not present

## 2018-12-17 DIAGNOSIS — D509 Iron deficiency anemia, unspecified: Secondary | ICD-10-CM | POA: Diagnosis not present

## 2018-12-17 DIAGNOSIS — N2581 Secondary hyperparathyroidism of renal origin: Secondary | ICD-10-CM | POA: Diagnosis not present

## 2018-12-19 DIAGNOSIS — N186 End stage renal disease: Secondary | ICD-10-CM | POA: Diagnosis not present

## 2018-12-19 DIAGNOSIS — E1122 Type 2 diabetes mellitus with diabetic chronic kidney disease: Secondary | ICD-10-CM | POA: Diagnosis not present

## 2018-12-19 DIAGNOSIS — E785 Hyperlipidemia, unspecified: Secondary | ICD-10-CM | POA: Diagnosis not present

## 2018-12-20 DIAGNOSIS — N186 End stage renal disease: Secondary | ICD-10-CM | POA: Diagnosis not present

## 2018-12-20 DIAGNOSIS — N2581 Secondary hyperparathyroidism of renal origin: Secondary | ICD-10-CM | POA: Diagnosis not present

## 2018-12-20 DIAGNOSIS — Z992 Dependence on renal dialysis: Secondary | ICD-10-CM | POA: Diagnosis not present

## 2018-12-20 DIAGNOSIS — D509 Iron deficiency anemia, unspecified: Secondary | ICD-10-CM | POA: Diagnosis not present

## 2018-12-20 DIAGNOSIS — E1129 Type 2 diabetes mellitus with other diabetic kidney complication: Secondary | ICD-10-CM | POA: Diagnosis not present

## 2018-12-20 DIAGNOSIS — D631 Anemia in chronic kidney disease: Secondary | ICD-10-CM | POA: Diagnosis not present

## 2018-12-22 DIAGNOSIS — E1129 Type 2 diabetes mellitus with other diabetic kidney complication: Secondary | ICD-10-CM | POA: Diagnosis not present

## 2018-12-22 DIAGNOSIS — D631 Anemia in chronic kidney disease: Secondary | ICD-10-CM | POA: Diagnosis not present

## 2018-12-22 DIAGNOSIS — D509 Iron deficiency anemia, unspecified: Secondary | ICD-10-CM | POA: Diagnosis not present

## 2018-12-22 DIAGNOSIS — N186 End stage renal disease: Secondary | ICD-10-CM | POA: Diagnosis not present

## 2018-12-22 DIAGNOSIS — N2581 Secondary hyperparathyroidism of renal origin: Secondary | ICD-10-CM | POA: Diagnosis not present

## 2018-12-22 DIAGNOSIS — Z992 Dependence on renal dialysis: Secondary | ICD-10-CM | POA: Diagnosis not present

## 2018-12-24 DIAGNOSIS — Z992 Dependence on renal dialysis: Secondary | ICD-10-CM | POA: Diagnosis not present

## 2018-12-24 DIAGNOSIS — N186 End stage renal disease: Secondary | ICD-10-CM | POA: Diagnosis not present

## 2018-12-24 DIAGNOSIS — D509 Iron deficiency anemia, unspecified: Secondary | ICD-10-CM | POA: Diagnosis not present

## 2018-12-24 DIAGNOSIS — D631 Anemia in chronic kidney disease: Secondary | ICD-10-CM | POA: Diagnosis not present

## 2018-12-24 DIAGNOSIS — E1129 Type 2 diabetes mellitus with other diabetic kidney complication: Secondary | ICD-10-CM | POA: Diagnosis not present

## 2018-12-24 DIAGNOSIS — N2581 Secondary hyperparathyroidism of renal origin: Secondary | ICD-10-CM | POA: Diagnosis not present

## 2018-12-25 DIAGNOSIS — E1129 Type 2 diabetes mellitus with other diabetic kidney complication: Secondary | ICD-10-CM | POA: Diagnosis not present

## 2018-12-25 DIAGNOSIS — N186 End stage renal disease: Secondary | ICD-10-CM | POA: Diagnosis not present

## 2018-12-25 DIAGNOSIS — Z992 Dependence on renal dialysis: Secondary | ICD-10-CM | POA: Diagnosis not present

## 2018-12-27 DIAGNOSIS — N186 End stage renal disease: Secondary | ICD-10-CM | POA: Diagnosis not present

## 2018-12-27 DIAGNOSIS — N2581 Secondary hyperparathyroidism of renal origin: Secondary | ICD-10-CM | POA: Diagnosis not present

## 2018-12-27 DIAGNOSIS — E1129 Type 2 diabetes mellitus with other diabetic kidney complication: Secondary | ICD-10-CM | POA: Diagnosis not present

## 2018-12-27 DIAGNOSIS — D631 Anemia in chronic kidney disease: Secondary | ICD-10-CM | POA: Diagnosis not present

## 2018-12-27 DIAGNOSIS — Z992 Dependence on renal dialysis: Secondary | ICD-10-CM | POA: Diagnosis not present

## 2018-12-30 DIAGNOSIS — N186 End stage renal disease: Secondary | ICD-10-CM | POA: Diagnosis not present

## 2018-12-30 DIAGNOSIS — D631 Anemia in chronic kidney disease: Secondary | ICD-10-CM | POA: Diagnosis not present

## 2018-12-30 DIAGNOSIS — E1129 Type 2 diabetes mellitus with other diabetic kidney complication: Secondary | ICD-10-CM | POA: Diagnosis not present

## 2018-12-30 DIAGNOSIS — Z992 Dependence on renal dialysis: Secondary | ICD-10-CM | POA: Diagnosis not present

## 2018-12-30 DIAGNOSIS — N2581 Secondary hyperparathyroidism of renal origin: Secondary | ICD-10-CM | POA: Diagnosis not present

## 2019-01-01 DIAGNOSIS — N2581 Secondary hyperparathyroidism of renal origin: Secondary | ICD-10-CM | POA: Diagnosis not present

## 2019-01-01 DIAGNOSIS — Z992 Dependence on renal dialysis: Secondary | ICD-10-CM | POA: Diagnosis not present

## 2019-01-01 DIAGNOSIS — E1129 Type 2 diabetes mellitus with other diabetic kidney complication: Secondary | ICD-10-CM | POA: Diagnosis not present

## 2019-01-01 DIAGNOSIS — N186 End stage renal disease: Secondary | ICD-10-CM | POA: Diagnosis not present

## 2019-01-01 DIAGNOSIS — D631 Anemia in chronic kidney disease: Secondary | ICD-10-CM | POA: Diagnosis not present

## 2019-01-02 DIAGNOSIS — F200 Paranoid schizophrenia: Secondary | ICD-10-CM | POA: Diagnosis not present

## 2019-01-03 DIAGNOSIS — E1129 Type 2 diabetes mellitus with other diabetic kidney complication: Secondary | ICD-10-CM | POA: Diagnosis not present

## 2019-01-03 DIAGNOSIS — N186 End stage renal disease: Secondary | ICD-10-CM | POA: Diagnosis not present

## 2019-01-03 DIAGNOSIS — Z992 Dependence on renal dialysis: Secondary | ICD-10-CM | POA: Diagnosis not present

## 2019-01-03 DIAGNOSIS — N2581 Secondary hyperparathyroidism of renal origin: Secondary | ICD-10-CM | POA: Diagnosis not present

## 2019-01-03 DIAGNOSIS — D631 Anemia in chronic kidney disease: Secondary | ICD-10-CM | POA: Diagnosis not present

## 2019-01-06 DIAGNOSIS — E1122 Type 2 diabetes mellitus with diabetic chronic kidney disease: Secondary | ICD-10-CM | POA: Diagnosis not present

## 2019-01-06 DIAGNOSIS — I10 Essential (primary) hypertension: Secondary | ICD-10-CM | POA: Diagnosis not present

## 2019-01-06 DIAGNOSIS — Z992 Dependence on renal dialysis: Secondary | ICD-10-CM | POA: Diagnosis not present

## 2019-01-06 DIAGNOSIS — E785 Hyperlipidemia, unspecified: Secondary | ICD-10-CM | POA: Diagnosis not present

## 2019-01-06 DIAGNOSIS — N2581 Secondary hyperparathyroidism of renal origin: Secondary | ICD-10-CM | POA: Diagnosis not present

## 2019-01-06 DIAGNOSIS — D631 Anemia in chronic kidney disease: Secondary | ICD-10-CM | POA: Diagnosis not present

## 2019-01-06 DIAGNOSIS — N186 End stage renal disease: Secondary | ICD-10-CM | POA: Diagnosis not present

## 2019-01-06 DIAGNOSIS — E1129 Type 2 diabetes mellitus with other diabetic kidney complication: Secondary | ICD-10-CM | POA: Diagnosis not present

## 2019-01-08 DIAGNOSIS — D631 Anemia in chronic kidney disease: Secondary | ICD-10-CM | POA: Diagnosis not present

## 2019-01-08 DIAGNOSIS — N2581 Secondary hyperparathyroidism of renal origin: Secondary | ICD-10-CM | POA: Diagnosis not present

## 2019-01-08 DIAGNOSIS — Z992 Dependence on renal dialysis: Secondary | ICD-10-CM | POA: Diagnosis not present

## 2019-01-08 DIAGNOSIS — E1129 Type 2 diabetes mellitus with other diabetic kidney complication: Secondary | ICD-10-CM | POA: Diagnosis not present

## 2019-01-08 DIAGNOSIS — N186 End stage renal disease: Secondary | ICD-10-CM | POA: Diagnosis not present

## 2019-01-10 DIAGNOSIS — N186 End stage renal disease: Secondary | ICD-10-CM | POA: Diagnosis not present

## 2019-01-10 DIAGNOSIS — D631 Anemia in chronic kidney disease: Secondary | ICD-10-CM | POA: Diagnosis not present

## 2019-01-10 DIAGNOSIS — Z992 Dependence on renal dialysis: Secondary | ICD-10-CM | POA: Diagnosis not present

## 2019-01-10 DIAGNOSIS — E1129 Type 2 diabetes mellitus with other diabetic kidney complication: Secondary | ICD-10-CM | POA: Diagnosis not present

## 2019-01-10 DIAGNOSIS — N2581 Secondary hyperparathyroidism of renal origin: Secondary | ICD-10-CM | POA: Diagnosis not present

## 2019-01-13 DIAGNOSIS — Z992 Dependence on renal dialysis: Secondary | ICD-10-CM | POA: Diagnosis not present

## 2019-01-13 DIAGNOSIS — E1129 Type 2 diabetes mellitus with other diabetic kidney complication: Secondary | ICD-10-CM | POA: Diagnosis not present

## 2019-01-13 DIAGNOSIS — N2581 Secondary hyperparathyroidism of renal origin: Secondary | ICD-10-CM | POA: Diagnosis not present

## 2019-01-13 DIAGNOSIS — D631 Anemia in chronic kidney disease: Secondary | ICD-10-CM | POA: Diagnosis not present

## 2019-01-13 DIAGNOSIS — N186 End stage renal disease: Secondary | ICD-10-CM | POA: Diagnosis not present

## 2019-01-15 DIAGNOSIS — N2581 Secondary hyperparathyroidism of renal origin: Secondary | ICD-10-CM | POA: Diagnosis not present

## 2019-01-15 DIAGNOSIS — E1129 Type 2 diabetes mellitus with other diabetic kidney complication: Secondary | ICD-10-CM | POA: Diagnosis not present

## 2019-01-15 DIAGNOSIS — D631 Anemia in chronic kidney disease: Secondary | ICD-10-CM | POA: Diagnosis not present

## 2019-01-15 DIAGNOSIS — Z992 Dependence on renal dialysis: Secondary | ICD-10-CM | POA: Diagnosis not present

## 2019-01-15 DIAGNOSIS — N186 End stage renal disease: Secondary | ICD-10-CM | POA: Diagnosis not present

## 2019-01-16 DIAGNOSIS — F200 Paranoid schizophrenia: Secondary | ICD-10-CM | POA: Diagnosis not present

## 2019-01-16 DIAGNOSIS — I10 Essential (primary) hypertension: Secondary | ICD-10-CM | POA: Diagnosis not present

## 2019-01-16 DIAGNOSIS — N186 End stage renal disease: Secondary | ICD-10-CM | POA: Diagnosis not present

## 2019-01-16 DIAGNOSIS — E1122 Type 2 diabetes mellitus with diabetic chronic kidney disease: Secondary | ICD-10-CM | POA: Diagnosis not present

## 2019-01-17 DIAGNOSIS — E1129 Type 2 diabetes mellitus with other diabetic kidney complication: Secondary | ICD-10-CM | POA: Diagnosis not present

## 2019-01-17 DIAGNOSIS — Z992 Dependence on renal dialysis: Secondary | ICD-10-CM | POA: Diagnosis not present

## 2019-01-17 DIAGNOSIS — N2581 Secondary hyperparathyroidism of renal origin: Secondary | ICD-10-CM | POA: Diagnosis not present

## 2019-01-17 DIAGNOSIS — D631 Anemia in chronic kidney disease: Secondary | ICD-10-CM | POA: Diagnosis not present

## 2019-01-17 DIAGNOSIS — N186 End stage renal disease: Secondary | ICD-10-CM | POA: Diagnosis not present

## 2019-01-20 DIAGNOSIS — N186 End stage renal disease: Secondary | ICD-10-CM | POA: Diagnosis not present

## 2019-01-20 DIAGNOSIS — D631 Anemia in chronic kidney disease: Secondary | ICD-10-CM | POA: Diagnosis not present

## 2019-01-20 DIAGNOSIS — Z992 Dependence on renal dialysis: Secondary | ICD-10-CM | POA: Diagnosis not present

## 2019-01-20 DIAGNOSIS — N2581 Secondary hyperparathyroidism of renal origin: Secondary | ICD-10-CM | POA: Diagnosis not present

## 2019-01-20 DIAGNOSIS — E1129 Type 2 diabetes mellitus with other diabetic kidney complication: Secondary | ICD-10-CM | POA: Diagnosis not present

## 2019-01-22 DIAGNOSIS — N186 End stage renal disease: Secondary | ICD-10-CM | POA: Diagnosis not present

## 2019-01-22 DIAGNOSIS — N2581 Secondary hyperparathyroidism of renal origin: Secondary | ICD-10-CM | POA: Diagnosis not present

## 2019-01-22 DIAGNOSIS — Z992 Dependence on renal dialysis: Secondary | ICD-10-CM | POA: Diagnosis not present

## 2019-01-22 DIAGNOSIS — E1129 Type 2 diabetes mellitus with other diabetic kidney complication: Secondary | ICD-10-CM | POA: Diagnosis not present

## 2019-01-22 DIAGNOSIS — D631 Anemia in chronic kidney disease: Secondary | ICD-10-CM | POA: Diagnosis not present

## 2019-01-24 DIAGNOSIS — N2581 Secondary hyperparathyroidism of renal origin: Secondary | ICD-10-CM | POA: Diagnosis not present

## 2019-01-24 DIAGNOSIS — D631 Anemia in chronic kidney disease: Secondary | ICD-10-CM | POA: Diagnosis not present

## 2019-01-24 DIAGNOSIS — E1129 Type 2 diabetes mellitus with other diabetic kidney complication: Secondary | ICD-10-CM | POA: Diagnosis not present

## 2019-01-24 DIAGNOSIS — N186 End stage renal disease: Secondary | ICD-10-CM | POA: Diagnosis not present

## 2019-01-24 DIAGNOSIS — Z992 Dependence on renal dialysis: Secondary | ICD-10-CM | POA: Diagnosis not present

## 2019-01-25 DIAGNOSIS — E1129 Type 2 diabetes mellitus with other diabetic kidney complication: Secondary | ICD-10-CM | POA: Diagnosis not present

## 2019-01-25 DIAGNOSIS — N186 End stage renal disease: Secondary | ICD-10-CM | POA: Diagnosis not present

## 2019-01-25 DIAGNOSIS — Z992 Dependence on renal dialysis: Secondary | ICD-10-CM | POA: Diagnosis not present

## 2019-01-27 DIAGNOSIS — N186 End stage renal disease: Secondary | ICD-10-CM | POA: Diagnosis not present

## 2019-01-27 DIAGNOSIS — E1129 Type 2 diabetes mellitus with other diabetic kidney complication: Secondary | ICD-10-CM | POA: Diagnosis not present

## 2019-01-27 DIAGNOSIS — D631 Anemia in chronic kidney disease: Secondary | ICD-10-CM | POA: Diagnosis not present

## 2019-01-27 DIAGNOSIS — Z992 Dependence on renal dialysis: Secondary | ICD-10-CM | POA: Diagnosis not present

## 2019-01-27 DIAGNOSIS — N2581 Secondary hyperparathyroidism of renal origin: Secondary | ICD-10-CM | POA: Diagnosis not present

## 2019-01-29 DIAGNOSIS — N186 End stage renal disease: Secondary | ICD-10-CM | POA: Diagnosis not present

## 2019-01-29 DIAGNOSIS — E1129 Type 2 diabetes mellitus with other diabetic kidney complication: Secondary | ICD-10-CM | POA: Diagnosis not present

## 2019-01-29 DIAGNOSIS — D631 Anemia in chronic kidney disease: Secondary | ICD-10-CM | POA: Diagnosis not present

## 2019-01-29 DIAGNOSIS — N2581 Secondary hyperparathyroidism of renal origin: Secondary | ICD-10-CM | POA: Diagnosis not present

## 2019-01-29 DIAGNOSIS — Z992 Dependence on renal dialysis: Secondary | ICD-10-CM | POA: Diagnosis not present

## 2019-01-30 DIAGNOSIS — F200 Paranoid schizophrenia: Secondary | ICD-10-CM | POA: Diagnosis not present

## 2019-01-31 DIAGNOSIS — N2581 Secondary hyperparathyroidism of renal origin: Secondary | ICD-10-CM | POA: Diagnosis not present

## 2019-01-31 DIAGNOSIS — N186 End stage renal disease: Secondary | ICD-10-CM | POA: Diagnosis not present

## 2019-01-31 DIAGNOSIS — D631 Anemia in chronic kidney disease: Secondary | ICD-10-CM | POA: Diagnosis not present

## 2019-01-31 DIAGNOSIS — E1129 Type 2 diabetes mellitus with other diabetic kidney complication: Secondary | ICD-10-CM | POA: Diagnosis not present

## 2019-01-31 DIAGNOSIS — Z992 Dependence on renal dialysis: Secondary | ICD-10-CM | POA: Diagnosis not present

## 2019-02-03 DIAGNOSIS — N186 End stage renal disease: Secondary | ICD-10-CM | POA: Diagnosis not present

## 2019-02-03 DIAGNOSIS — N2581 Secondary hyperparathyroidism of renal origin: Secondary | ICD-10-CM | POA: Diagnosis not present

## 2019-02-03 DIAGNOSIS — E1129 Type 2 diabetes mellitus with other diabetic kidney complication: Secondary | ICD-10-CM | POA: Diagnosis not present

## 2019-02-03 DIAGNOSIS — Z992 Dependence on renal dialysis: Secondary | ICD-10-CM | POA: Diagnosis not present

## 2019-02-03 DIAGNOSIS — D631 Anemia in chronic kidney disease: Secondary | ICD-10-CM | POA: Diagnosis not present

## 2019-02-05 DIAGNOSIS — Z992 Dependence on renal dialysis: Secondary | ICD-10-CM | POA: Diagnosis not present

## 2019-02-05 DIAGNOSIS — N2581 Secondary hyperparathyroidism of renal origin: Secondary | ICD-10-CM | POA: Diagnosis not present

## 2019-02-05 DIAGNOSIS — N186 End stage renal disease: Secondary | ICD-10-CM | POA: Diagnosis not present

## 2019-02-05 DIAGNOSIS — D631 Anemia in chronic kidney disease: Secondary | ICD-10-CM | POA: Diagnosis not present

## 2019-02-05 DIAGNOSIS — E785 Hyperlipidemia, unspecified: Secondary | ICD-10-CM | POA: Diagnosis not present

## 2019-02-05 DIAGNOSIS — E1122 Type 2 diabetes mellitus with diabetic chronic kidney disease: Secondary | ICD-10-CM | POA: Diagnosis not present

## 2019-02-05 DIAGNOSIS — I10 Essential (primary) hypertension: Secondary | ICD-10-CM | POA: Diagnosis not present

## 2019-02-05 DIAGNOSIS — E1129 Type 2 diabetes mellitus with other diabetic kidney complication: Secondary | ICD-10-CM | POA: Diagnosis not present

## 2019-02-07 DIAGNOSIS — E1129 Type 2 diabetes mellitus with other diabetic kidney complication: Secondary | ICD-10-CM | POA: Diagnosis not present

## 2019-02-07 DIAGNOSIS — Z992 Dependence on renal dialysis: Secondary | ICD-10-CM | POA: Diagnosis not present

## 2019-02-07 DIAGNOSIS — N186 End stage renal disease: Secondary | ICD-10-CM | POA: Diagnosis not present

## 2019-02-07 DIAGNOSIS — N2581 Secondary hyperparathyroidism of renal origin: Secondary | ICD-10-CM | POA: Diagnosis not present

## 2019-02-07 DIAGNOSIS — D631 Anemia in chronic kidney disease: Secondary | ICD-10-CM | POA: Diagnosis not present

## 2019-02-10 DIAGNOSIS — E1129 Type 2 diabetes mellitus with other diabetic kidney complication: Secondary | ICD-10-CM | POA: Diagnosis not present

## 2019-02-10 DIAGNOSIS — N186 End stage renal disease: Secondary | ICD-10-CM | POA: Diagnosis not present

## 2019-02-10 DIAGNOSIS — Z992 Dependence on renal dialysis: Secondary | ICD-10-CM | POA: Diagnosis not present

## 2019-02-10 DIAGNOSIS — D631 Anemia in chronic kidney disease: Secondary | ICD-10-CM | POA: Diagnosis not present

## 2019-02-10 DIAGNOSIS — N2581 Secondary hyperparathyroidism of renal origin: Secondary | ICD-10-CM | POA: Diagnosis not present

## 2019-02-12 DIAGNOSIS — D631 Anemia in chronic kidney disease: Secondary | ICD-10-CM | POA: Diagnosis not present

## 2019-02-12 DIAGNOSIS — Z992 Dependence on renal dialysis: Secondary | ICD-10-CM | POA: Diagnosis not present

## 2019-02-12 DIAGNOSIS — E1129 Type 2 diabetes mellitus with other diabetic kidney complication: Secondary | ICD-10-CM | POA: Diagnosis not present

## 2019-02-12 DIAGNOSIS — N186 End stage renal disease: Secondary | ICD-10-CM | POA: Diagnosis not present

## 2019-02-12 DIAGNOSIS — N2581 Secondary hyperparathyroidism of renal origin: Secondary | ICD-10-CM | POA: Diagnosis not present

## 2019-02-13 DIAGNOSIS — E1122 Type 2 diabetes mellitus with diabetic chronic kidney disease: Secondary | ICD-10-CM | POA: Diagnosis not present

## 2019-02-13 DIAGNOSIS — N186 End stage renal disease: Secondary | ICD-10-CM | POA: Diagnosis not present

## 2019-02-13 DIAGNOSIS — I10 Essential (primary) hypertension: Secondary | ICD-10-CM | POA: Diagnosis not present

## 2019-02-14 DIAGNOSIS — Z992 Dependence on renal dialysis: Secondary | ICD-10-CM | POA: Diagnosis not present

## 2019-02-14 DIAGNOSIS — D631 Anemia in chronic kidney disease: Secondary | ICD-10-CM | POA: Diagnosis not present

## 2019-02-14 DIAGNOSIS — N186 End stage renal disease: Secondary | ICD-10-CM | POA: Diagnosis not present

## 2019-02-14 DIAGNOSIS — E1129 Type 2 diabetes mellitus with other diabetic kidney complication: Secondary | ICD-10-CM | POA: Diagnosis not present

## 2019-02-14 DIAGNOSIS — N2581 Secondary hyperparathyroidism of renal origin: Secondary | ICD-10-CM | POA: Diagnosis not present

## 2019-02-17 DIAGNOSIS — Z992 Dependence on renal dialysis: Secondary | ICD-10-CM | POA: Diagnosis not present

## 2019-02-17 DIAGNOSIS — N2581 Secondary hyperparathyroidism of renal origin: Secondary | ICD-10-CM | POA: Diagnosis not present

## 2019-02-17 DIAGNOSIS — D631 Anemia in chronic kidney disease: Secondary | ICD-10-CM | POA: Diagnosis not present

## 2019-02-17 DIAGNOSIS — N186 End stage renal disease: Secondary | ICD-10-CM | POA: Diagnosis not present

## 2019-02-17 DIAGNOSIS — E1129 Type 2 diabetes mellitus with other diabetic kidney complication: Secondary | ICD-10-CM | POA: Diagnosis not present

## 2019-02-19 DIAGNOSIS — Z992 Dependence on renal dialysis: Secondary | ICD-10-CM | POA: Diagnosis not present

## 2019-02-19 DIAGNOSIS — E1129 Type 2 diabetes mellitus with other diabetic kidney complication: Secondary | ICD-10-CM | POA: Diagnosis not present

## 2019-02-19 DIAGNOSIS — D631 Anemia in chronic kidney disease: Secondary | ICD-10-CM | POA: Diagnosis not present

## 2019-02-19 DIAGNOSIS — N2581 Secondary hyperparathyroidism of renal origin: Secondary | ICD-10-CM | POA: Diagnosis not present

## 2019-02-19 DIAGNOSIS — N186 End stage renal disease: Secondary | ICD-10-CM | POA: Diagnosis not present

## 2019-02-21 DIAGNOSIS — D631 Anemia in chronic kidney disease: Secondary | ICD-10-CM | POA: Diagnosis not present

## 2019-02-21 DIAGNOSIS — N2581 Secondary hyperparathyroidism of renal origin: Secondary | ICD-10-CM | POA: Diagnosis not present

## 2019-02-21 DIAGNOSIS — E1129 Type 2 diabetes mellitus with other diabetic kidney complication: Secondary | ICD-10-CM | POA: Diagnosis not present

## 2019-02-21 DIAGNOSIS — Z992 Dependence on renal dialysis: Secondary | ICD-10-CM | POA: Diagnosis not present

## 2019-02-21 DIAGNOSIS — N186 End stage renal disease: Secondary | ICD-10-CM | POA: Diagnosis not present

## 2019-02-23 DIAGNOSIS — N186 End stage renal disease: Secondary | ICD-10-CM | POA: Diagnosis not present

## 2019-02-23 DIAGNOSIS — E1129 Type 2 diabetes mellitus with other diabetic kidney complication: Secondary | ICD-10-CM | POA: Diagnosis not present

## 2019-02-23 DIAGNOSIS — Z992 Dependence on renal dialysis: Secondary | ICD-10-CM | POA: Diagnosis not present

## 2019-02-24 DIAGNOSIS — Z992 Dependence on renal dialysis: Secondary | ICD-10-CM | POA: Diagnosis not present

## 2019-02-24 DIAGNOSIS — N186 End stage renal disease: Secondary | ICD-10-CM | POA: Diagnosis not present

## 2019-02-24 DIAGNOSIS — N2581 Secondary hyperparathyroidism of renal origin: Secondary | ICD-10-CM | POA: Diagnosis not present

## 2019-02-24 DIAGNOSIS — E1129 Type 2 diabetes mellitus with other diabetic kidney complication: Secondary | ICD-10-CM | POA: Diagnosis not present

## 2019-02-24 DIAGNOSIS — D631 Anemia in chronic kidney disease: Secondary | ICD-10-CM | POA: Diagnosis not present

## 2019-02-26 DIAGNOSIS — D631 Anemia in chronic kidney disease: Secondary | ICD-10-CM | POA: Diagnosis not present

## 2019-02-26 DIAGNOSIS — N2581 Secondary hyperparathyroidism of renal origin: Secondary | ICD-10-CM | POA: Diagnosis not present

## 2019-02-26 DIAGNOSIS — Z992 Dependence on renal dialysis: Secondary | ICD-10-CM | POA: Diagnosis not present

## 2019-02-26 DIAGNOSIS — N186 End stage renal disease: Secondary | ICD-10-CM | POA: Diagnosis not present

## 2019-02-26 DIAGNOSIS — E1129 Type 2 diabetes mellitus with other diabetic kidney complication: Secondary | ICD-10-CM | POA: Diagnosis not present

## 2019-02-28 DIAGNOSIS — N2581 Secondary hyperparathyroidism of renal origin: Secondary | ICD-10-CM | POA: Diagnosis not present

## 2019-02-28 DIAGNOSIS — Z992 Dependence on renal dialysis: Secondary | ICD-10-CM | POA: Diagnosis not present

## 2019-02-28 DIAGNOSIS — D631 Anemia in chronic kidney disease: Secondary | ICD-10-CM | POA: Diagnosis not present

## 2019-02-28 DIAGNOSIS — N186 End stage renal disease: Secondary | ICD-10-CM | POA: Diagnosis not present

## 2019-02-28 DIAGNOSIS — E1129 Type 2 diabetes mellitus with other diabetic kidney complication: Secondary | ICD-10-CM | POA: Diagnosis not present

## 2019-03-03 DIAGNOSIS — E1129 Type 2 diabetes mellitus with other diabetic kidney complication: Secondary | ICD-10-CM | POA: Diagnosis not present

## 2019-03-03 DIAGNOSIS — N186 End stage renal disease: Secondary | ICD-10-CM | POA: Diagnosis not present

## 2019-03-03 DIAGNOSIS — N2581 Secondary hyperparathyroidism of renal origin: Secondary | ICD-10-CM | POA: Diagnosis not present

## 2019-03-03 DIAGNOSIS — D631 Anemia in chronic kidney disease: Secondary | ICD-10-CM | POA: Diagnosis not present

## 2019-03-03 DIAGNOSIS — Z992 Dependence on renal dialysis: Secondary | ICD-10-CM | POA: Diagnosis not present

## 2019-03-05 DIAGNOSIS — D631 Anemia in chronic kidney disease: Secondary | ICD-10-CM | POA: Diagnosis not present

## 2019-03-05 DIAGNOSIS — E1129 Type 2 diabetes mellitus with other diabetic kidney complication: Secondary | ICD-10-CM | POA: Diagnosis not present

## 2019-03-05 DIAGNOSIS — Z992 Dependence on renal dialysis: Secondary | ICD-10-CM | POA: Diagnosis not present

## 2019-03-05 DIAGNOSIS — N186 End stage renal disease: Secondary | ICD-10-CM | POA: Diagnosis not present

## 2019-03-05 DIAGNOSIS — N2581 Secondary hyperparathyroidism of renal origin: Secondary | ICD-10-CM | POA: Diagnosis not present

## 2019-03-06 DIAGNOSIS — F200 Paranoid schizophrenia: Secondary | ICD-10-CM | POA: Diagnosis not present

## 2019-03-07 DIAGNOSIS — D631 Anemia in chronic kidney disease: Secondary | ICD-10-CM | POA: Diagnosis not present

## 2019-03-07 DIAGNOSIS — Z992 Dependence on renal dialysis: Secondary | ICD-10-CM | POA: Diagnosis not present

## 2019-03-07 DIAGNOSIS — N186 End stage renal disease: Secondary | ICD-10-CM | POA: Diagnosis not present

## 2019-03-07 DIAGNOSIS — E1129 Type 2 diabetes mellitus with other diabetic kidney complication: Secondary | ICD-10-CM | POA: Diagnosis not present

## 2019-03-07 DIAGNOSIS — N2581 Secondary hyperparathyroidism of renal origin: Secondary | ICD-10-CM | POA: Diagnosis not present

## 2019-03-10 DIAGNOSIS — D631 Anemia in chronic kidney disease: Secondary | ICD-10-CM | POA: Diagnosis not present

## 2019-03-10 DIAGNOSIS — N186 End stage renal disease: Secondary | ICD-10-CM | POA: Diagnosis not present

## 2019-03-10 DIAGNOSIS — E1129 Type 2 diabetes mellitus with other diabetic kidney complication: Secondary | ICD-10-CM | POA: Diagnosis not present

## 2019-03-10 DIAGNOSIS — N2581 Secondary hyperparathyroidism of renal origin: Secondary | ICD-10-CM | POA: Diagnosis not present

## 2019-03-10 DIAGNOSIS — Z992 Dependence on renal dialysis: Secondary | ICD-10-CM | POA: Diagnosis not present

## 2019-03-12 DIAGNOSIS — D631 Anemia in chronic kidney disease: Secondary | ICD-10-CM | POA: Diagnosis not present

## 2019-03-12 DIAGNOSIS — Z992 Dependence on renal dialysis: Secondary | ICD-10-CM | POA: Diagnosis not present

## 2019-03-12 DIAGNOSIS — E1129 Type 2 diabetes mellitus with other diabetic kidney complication: Secondary | ICD-10-CM | POA: Diagnosis not present

## 2019-03-12 DIAGNOSIS — N186 End stage renal disease: Secondary | ICD-10-CM | POA: Diagnosis not present

## 2019-03-12 DIAGNOSIS — N2581 Secondary hyperparathyroidism of renal origin: Secondary | ICD-10-CM | POA: Diagnosis not present

## 2019-03-13 DIAGNOSIS — I10 Essential (primary) hypertension: Secondary | ICD-10-CM | POA: Diagnosis not present

## 2019-03-13 DIAGNOSIS — N186 End stage renal disease: Secondary | ICD-10-CM | POA: Diagnosis not present

## 2019-03-13 DIAGNOSIS — I5040 Unspecified combined systolic (congestive) and diastolic (congestive) heart failure: Secondary | ICD-10-CM | POA: Diagnosis not present

## 2019-03-13 DIAGNOSIS — E1122 Type 2 diabetes mellitus with diabetic chronic kidney disease: Secondary | ICD-10-CM | POA: Diagnosis not present

## 2019-03-14 DIAGNOSIS — E785 Hyperlipidemia, unspecified: Secondary | ICD-10-CM | POA: Diagnosis not present

## 2019-03-14 DIAGNOSIS — D631 Anemia in chronic kidney disease: Secondary | ICD-10-CM | POA: Diagnosis not present

## 2019-03-14 DIAGNOSIS — I10 Essential (primary) hypertension: Secondary | ICD-10-CM | POA: Diagnosis not present

## 2019-03-14 DIAGNOSIS — E1129 Type 2 diabetes mellitus with other diabetic kidney complication: Secondary | ICD-10-CM | POA: Diagnosis not present

## 2019-03-14 DIAGNOSIS — N186 End stage renal disease: Secondary | ICD-10-CM | POA: Diagnosis not present

## 2019-03-14 DIAGNOSIS — Z992 Dependence on renal dialysis: Secondary | ICD-10-CM | POA: Diagnosis not present

## 2019-03-14 DIAGNOSIS — N2581 Secondary hyperparathyroidism of renal origin: Secondary | ICD-10-CM | POA: Diagnosis not present

## 2019-03-14 DIAGNOSIS — E1122 Type 2 diabetes mellitus with diabetic chronic kidney disease: Secondary | ICD-10-CM | POA: Diagnosis not present

## 2019-03-17 DIAGNOSIS — D631 Anemia in chronic kidney disease: Secondary | ICD-10-CM | POA: Diagnosis not present

## 2019-03-17 DIAGNOSIS — E1129 Type 2 diabetes mellitus with other diabetic kidney complication: Secondary | ICD-10-CM | POA: Diagnosis not present

## 2019-03-17 DIAGNOSIS — Z992 Dependence on renal dialysis: Secondary | ICD-10-CM | POA: Diagnosis not present

## 2019-03-17 DIAGNOSIS — N2581 Secondary hyperparathyroidism of renal origin: Secondary | ICD-10-CM | POA: Diagnosis not present

## 2019-03-17 DIAGNOSIS — N186 End stage renal disease: Secondary | ICD-10-CM | POA: Diagnosis not present

## 2019-03-19 DIAGNOSIS — N186 End stage renal disease: Secondary | ICD-10-CM | POA: Diagnosis not present

## 2019-03-19 DIAGNOSIS — E1129 Type 2 diabetes mellitus with other diabetic kidney complication: Secondary | ICD-10-CM | POA: Diagnosis not present

## 2019-03-19 DIAGNOSIS — D631 Anemia in chronic kidney disease: Secondary | ICD-10-CM | POA: Diagnosis not present

## 2019-03-19 DIAGNOSIS — Z992 Dependence on renal dialysis: Secondary | ICD-10-CM | POA: Diagnosis not present

## 2019-03-19 DIAGNOSIS — N2581 Secondary hyperparathyroidism of renal origin: Secondary | ICD-10-CM | POA: Diagnosis not present

## 2019-03-21 DIAGNOSIS — D631 Anemia in chronic kidney disease: Secondary | ICD-10-CM | POA: Diagnosis not present

## 2019-03-21 DIAGNOSIS — E1129 Type 2 diabetes mellitus with other diabetic kidney complication: Secondary | ICD-10-CM | POA: Diagnosis not present

## 2019-03-21 DIAGNOSIS — Z992 Dependence on renal dialysis: Secondary | ICD-10-CM | POA: Diagnosis not present

## 2019-03-21 DIAGNOSIS — N2581 Secondary hyperparathyroidism of renal origin: Secondary | ICD-10-CM | POA: Diagnosis not present

## 2019-03-21 DIAGNOSIS — N186 End stage renal disease: Secondary | ICD-10-CM | POA: Diagnosis not present

## 2019-03-24 DIAGNOSIS — N186 End stage renal disease: Secondary | ICD-10-CM | POA: Diagnosis not present

## 2019-03-24 DIAGNOSIS — N2581 Secondary hyperparathyroidism of renal origin: Secondary | ICD-10-CM | POA: Diagnosis not present

## 2019-03-24 DIAGNOSIS — Z992 Dependence on renal dialysis: Secondary | ICD-10-CM | POA: Diagnosis not present

## 2019-03-24 DIAGNOSIS — D631 Anemia in chronic kidney disease: Secondary | ICD-10-CM | POA: Diagnosis not present

## 2019-03-24 DIAGNOSIS — E1129 Type 2 diabetes mellitus with other diabetic kidney complication: Secondary | ICD-10-CM | POA: Diagnosis not present

## 2019-03-26 DIAGNOSIS — D631 Anemia in chronic kidney disease: Secondary | ICD-10-CM | POA: Diagnosis not present

## 2019-03-26 DIAGNOSIS — Z992 Dependence on renal dialysis: Secondary | ICD-10-CM | POA: Diagnosis not present

## 2019-03-26 DIAGNOSIS — E875 Hyperkalemia: Secondary | ICD-10-CM | POA: Diagnosis not present

## 2019-03-26 DIAGNOSIS — E1129 Type 2 diabetes mellitus with other diabetic kidney complication: Secondary | ICD-10-CM | POA: Diagnosis not present

## 2019-03-26 DIAGNOSIS — N186 End stage renal disease: Secondary | ICD-10-CM | POA: Diagnosis not present

## 2019-03-26 DIAGNOSIS — N2581 Secondary hyperparathyroidism of renal origin: Secondary | ICD-10-CM | POA: Diagnosis not present

## 2019-03-28 DIAGNOSIS — N2581 Secondary hyperparathyroidism of renal origin: Secondary | ICD-10-CM | POA: Diagnosis not present

## 2019-03-28 DIAGNOSIS — Z992 Dependence on renal dialysis: Secondary | ICD-10-CM | POA: Diagnosis not present

## 2019-03-28 DIAGNOSIS — N186 End stage renal disease: Secondary | ICD-10-CM | POA: Diagnosis not present

## 2019-03-28 DIAGNOSIS — E1129 Type 2 diabetes mellitus with other diabetic kidney complication: Secondary | ICD-10-CM | POA: Diagnosis not present

## 2019-03-28 DIAGNOSIS — D631 Anemia in chronic kidney disease: Secondary | ICD-10-CM | POA: Diagnosis not present

## 2019-03-28 DIAGNOSIS — E875 Hyperkalemia: Secondary | ICD-10-CM | POA: Diagnosis not present

## 2019-03-31 DIAGNOSIS — Z992 Dependence on renal dialysis: Secondary | ICD-10-CM | POA: Diagnosis not present

## 2019-03-31 DIAGNOSIS — E875 Hyperkalemia: Secondary | ICD-10-CM | POA: Diagnosis not present

## 2019-03-31 DIAGNOSIS — D631 Anemia in chronic kidney disease: Secondary | ICD-10-CM | POA: Diagnosis not present

## 2019-03-31 DIAGNOSIS — N186 End stage renal disease: Secondary | ICD-10-CM | POA: Diagnosis not present

## 2019-03-31 DIAGNOSIS — E1129 Type 2 diabetes mellitus with other diabetic kidney complication: Secondary | ICD-10-CM | POA: Diagnosis not present

## 2019-03-31 DIAGNOSIS — N2581 Secondary hyperparathyroidism of renal origin: Secondary | ICD-10-CM | POA: Diagnosis not present

## 2019-04-02 DIAGNOSIS — E875 Hyperkalemia: Secondary | ICD-10-CM | POA: Diagnosis not present

## 2019-04-02 DIAGNOSIS — Z992 Dependence on renal dialysis: Secondary | ICD-10-CM | POA: Diagnosis not present

## 2019-04-02 DIAGNOSIS — D631 Anemia in chronic kidney disease: Secondary | ICD-10-CM | POA: Diagnosis not present

## 2019-04-02 DIAGNOSIS — N186 End stage renal disease: Secondary | ICD-10-CM | POA: Diagnosis not present

## 2019-04-02 DIAGNOSIS — N2581 Secondary hyperparathyroidism of renal origin: Secondary | ICD-10-CM | POA: Diagnosis not present

## 2019-04-02 DIAGNOSIS — E1129 Type 2 diabetes mellitus with other diabetic kidney complication: Secondary | ICD-10-CM | POA: Diagnosis not present

## 2019-04-03 DIAGNOSIS — F200 Paranoid schizophrenia: Secondary | ICD-10-CM | POA: Diagnosis not present

## 2019-04-04 DIAGNOSIS — E1129 Type 2 diabetes mellitus with other diabetic kidney complication: Secondary | ICD-10-CM | POA: Diagnosis not present

## 2019-04-04 DIAGNOSIS — E875 Hyperkalemia: Secondary | ICD-10-CM | POA: Diagnosis not present

## 2019-04-04 DIAGNOSIS — N186 End stage renal disease: Secondary | ICD-10-CM | POA: Diagnosis not present

## 2019-04-04 DIAGNOSIS — D631 Anemia in chronic kidney disease: Secondary | ICD-10-CM | POA: Diagnosis not present

## 2019-04-04 DIAGNOSIS — N2581 Secondary hyperparathyroidism of renal origin: Secondary | ICD-10-CM | POA: Diagnosis not present

## 2019-04-04 DIAGNOSIS — Z992 Dependence on renal dialysis: Secondary | ICD-10-CM | POA: Diagnosis not present

## 2019-04-07 DIAGNOSIS — E875 Hyperkalemia: Secondary | ICD-10-CM | POA: Diagnosis not present

## 2019-04-07 DIAGNOSIS — N186 End stage renal disease: Secondary | ICD-10-CM | POA: Diagnosis not present

## 2019-04-07 DIAGNOSIS — D631 Anemia in chronic kidney disease: Secondary | ICD-10-CM | POA: Diagnosis not present

## 2019-04-07 DIAGNOSIS — Z992 Dependence on renal dialysis: Secondary | ICD-10-CM | POA: Diagnosis not present

## 2019-04-07 DIAGNOSIS — N2581 Secondary hyperparathyroidism of renal origin: Secondary | ICD-10-CM | POA: Diagnosis not present

## 2019-04-07 DIAGNOSIS — E1129 Type 2 diabetes mellitus with other diabetic kidney complication: Secondary | ICD-10-CM | POA: Diagnosis not present

## 2019-04-09 DIAGNOSIS — D631 Anemia in chronic kidney disease: Secondary | ICD-10-CM | POA: Diagnosis not present

## 2019-04-09 DIAGNOSIS — N186 End stage renal disease: Secondary | ICD-10-CM | POA: Diagnosis not present

## 2019-04-09 DIAGNOSIS — Z992 Dependence on renal dialysis: Secondary | ICD-10-CM | POA: Diagnosis not present

## 2019-04-09 DIAGNOSIS — E875 Hyperkalemia: Secondary | ICD-10-CM | POA: Diagnosis not present

## 2019-04-09 DIAGNOSIS — E1129 Type 2 diabetes mellitus with other diabetic kidney complication: Secondary | ICD-10-CM | POA: Diagnosis not present

## 2019-04-09 DIAGNOSIS — N2581 Secondary hyperparathyroidism of renal origin: Secondary | ICD-10-CM | POA: Diagnosis not present

## 2019-04-10 DIAGNOSIS — N186 End stage renal disease: Secondary | ICD-10-CM | POA: Diagnosis not present

## 2019-04-10 DIAGNOSIS — E1122 Type 2 diabetes mellitus with diabetic chronic kidney disease: Secondary | ICD-10-CM | POA: Diagnosis not present

## 2019-04-10 DIAGNOSIS — I10 Essential (primary) hypertension: Secondary | ICD-10-CM | POA: Diagnosis not present

## 2019-04-10 DIAGNOSIS — E785 Hyperlipidemia, unspecified: Secondary | ICD-10-CM | POA: Diagnosis not present

## 2019-04-11 DIAGNOSIS — E1129 Type 2 diabetes mellitus with other diabetic kidney complication: Secondary | ICD-10-CM | POA: Diagnosis not present

## 2019-04-11 DIAGNOSIS — Z992 Dependence on renal dialysis: Secondary | ICD-10-CM | POA: Diagnosis not present

## 2019-04-11 DIAGNOSIS — N186 End stage renal disease: Secondary | ICD-10-CM | POA: Diagnosis not present

## 2019-04-11 DIAGNOSIS — N2581 Secondary hyperparathyroidism of renal origin: Secondary | ICD-10-CM | POA: Diagnosis not present

## 2019-04-11 DIAGNOSIS — D631 Anemia in chronic kidney disease: Secondary | ICD-10-CM | POA: Diagnosis not present

## 2019-04-11 DIAGNOSIS — E875 Hyperkalemia: Secondary | ICD-10-CM | POA: Diagnosis not present

## 2019-04-14 DIAGNOSIS — N2581 Secondary hyperparathyroidism of renal origin: Secondary | ICD-10-CM | POA: Diagnosis not present

## 2019-04-14 DIAGNOSIS — N186 End stage renal disease: Secondary | ICD-10-CM | POA: Diagnosis not present

## 2019-04-14 DIAGNOSIS — D631 Anemia in chronic kidney disease: Secondary | ICD-10-CM | POA: Diagnosis not present

## 2019-04-14 DIAGNOSIS — E875 Hyperkalemia: Secondary | ICD-10-CM | POA: Diagnosis not present

## 2019-04-14 DIAGNOSIS — E1129 Type 2 diabetes mellitus with other diabetic kidney complication: Secondary | ICD-10-CM | POA: Diagnosis not present

## 2019-04-14 DIAGNOSIS — Z992 Dependence on renal dialysis: Secondary | ICD-10-CM | POA: Diagnosis not present

## 2019-04-15 DIAGNOSIS — E1122 Type 2 diabetes mellitus with diabetic chronic kidney disease: Secondary | ICD-10-CM | POA: Diagnosis not present

## 2019-04-15 DIAGNOSIS — N186 End stage renal disease: Secondary | ICD-10-CM | POA: Diagnosis not present

## 2019-04-15 DIAGNOSIS — F200 Paranoid schizophrenia: Secondary | ICD-10-CM | POA: Diagnosis not present

## 2019-04-15 DIAGNOSIS — I10 Essential (primary) hypertension: Secondary | ICD-10-CM | POA: Diagnosis not present

## 2019-04-16 DIAGNOSIS — N2581 Secondary hyperparathyroidism of renal origin: Secondary | ICD-10-CM | POA: Diagnosis not present

## 2019-04-16 DIAGNOSIS — E1129 Type 2 diabetes mellitus with other diabetic kidney complication: Secondary | ICD-10-CM | POA: Diagnosis not present

## 2019-04-16 DIAGNOSIS — Z992 Dependence on renal dialysis: Secondary | ICD-10-CM | POA: Diagnosis not present

## 2019-04-16 DIAGNOSIS — E875 Hyperkalemia: Secondary | ICD-10-CM | POA: Diagnosis not present

## 2019-04-16 DIAGNOSIS — N186 End stage renal disease: Secondary | ICD-10-CM | POA: Diagnosis not present

## 2019-04-16 DIAGNOSIS — D631 Anemia in chronic kidney disease: Secondary | ICD-10-CM | POA: Diagnosis not present

## 2019-04-18 DIAGNOSIS — E1129 Type 2 diabetes mellitus with other diabetic kidney complication: Secondary | ICD-10-CM | POA: Diagnosis not present

## 2019-04-18 DIAGNOSIS — D631 Anemia in chronic kidney disease: Secondary | ICD-10-CM | POA: Diagnosis not present

## 2019-04-18 DIAGNOSIS — N2581 Secondary hyperparathyroidism of renal origin: Secondary | ICD-10-CM | POA: Diagnosis not present

## 2019-04-18 DIAGNOSIS — Z992 Dependence on renal dialysis: Secondary | ICD-10-CM | POA: Diagnosis not present

## 2019-04-18 DIAGNOSIS — N186 End stage renal disease: Secondary | ICD-10-CM | POA: Diagnosis not present

## 2019-04-18 DIAGNOSIS — E875 Hyperkalemia: Secondary | ICD-10-CM | POA: Diagnosis not present

## 2019-04-21 DIAGNOSIS — E785 Hyperlipidemia, unspecified: Secondary | ICD-10-CM | POA: Diagnosis not present

## 2019-04-21 DIAGNOSIS — D649 Anemia, unspecified: Secondary | ICD-10-CM | POA: Diagnosis not present

## 2019-04-21 DIAGNOSIS — D631 Anemia in chronic kidney disease: Secondary | ICD-10-CM | POA: Diagnosis not present

## 2019-04-21 DIAGNOSIS — E1129 Type 2 diabetes mellitus with other diabetic kidney complication: Secondary | ICD-10-CM | POA: Diagnosis not present

## 2019-04-21 DIAGNOSIS — E559 Vitamin D deficiency, unspecified: Secondary | ICD-10-CM | POA: Diagnosis not present

## 2019-04-21 DIAGNOSIS — E875 Hyperkalemia: Secondary | ICD-10-CM | POA: Diagnosis not present

## 2019-04-21 DIAGNOSIS — E039 Hypothyroidism, unspecified: Secondary | ICD-10-CM | POA: Diagnosis not present

## 2019-04-21 DIAGNOSIS — Z992 Dependence on renal dialysis: Secondary | ICD-10-CM | POA: Diagnosis not present

## 2019-04-21 DIAGNOSIS — N186 End stage renal disease: Secondary | ICD-10-CM | POA: Diagnosis not present

## 2019-04-21 DIAGNOSIS — N2581 Secondary hyperparathyroidism of renal origin: Secondary | ICD-10-CM | POA: Diagnosis not present

## 2019-04-21 DIAGNOSIS — E1122 Type 2 diabetes mellitus with diabetic chronic kidney disease: Secondary | ICD-10-CM | POA: Diagnosis not present

## 2019-04-21 DIAGNOSIS — I10 Essential (primary) hypertension: Secondary | ICD-10-CM | POA: Diagnosis not present

## 2019-04-21 DIAGNOSIS — A000 Cholera due to Vibrio cholerae 01, biovar cholerae: Secondary | ICD-10-CM | POA: Diagnosis not present

## 2019-04-23 DIAGNOSIS — D631 Anemia in chronic kidney disease: Secondary | ICD-10-CM | POA: Diagnosis not present

## 2019-04-23 DIAGNOSIS — N2581 Secondary hyperparathyroidism of renal origin: Secondary | ICD-10-CM | POA: Diagnosis not present

## 2019-04-23 DIAGNOSIS — Z992 Dependence on renal dialysis: Secondary | ICD-10-CM | POA: Diagnosis not present

## 2019-04-23 DIAGNOSIS — E875 Hyperkalemia: Secondary | ICD-10-CM | POA: Diagnosis not present

## 2019-04-23 DIAGNOSIS — E1129 Type 2 diabetes mellitus with other diabetic kidney complication: Secondary | ICD-10-CM | POA: Diagnosis not present

## 2019-04-23 DIAGNOSIS — N186 End stage renal disease: Secondary | ICD-10-CM | POA: Diagnosis not present

## 2019-04-25 DIAGNOSIS — D631 Anemia in chronic kidney disease: Secondary | ICD-10-CM | POA: Diagnosis not present

## 2019-04-25 DIAGNOSIS — E1129 Type 2 diabetes mellitus with other diabetic kidney complication: Secondary | ICD-10-CM | POA: Diagnosis not present

## 2019-04-25 DIAGNOSIS — Z992 Dependence on renal dialysis: Secondary | ICD-10-CM | POA: Diagnosis not present

## 2019-04-25 DIAGNOSIS — N186 End stage renal disease: Secondary | ICD-10-CM | POA: Diagnosis not present

## 2019-04-25 DIAGNOSIS — N2581 Secondary hyperparathyroidism of renal origin: Secondary | ICD-10-CM | POA: Diagnosis not present

## 2019-04-28 DIAGNOSIS — Z992 Dependence on renal dialysis: Secondary | ICD-10-CM | POA: Diagnosis not present

## 2019-04-28 DIAGNOSIS — N186 End stage renal disease: Secondary | ICD-10-CM | POA: Diagnosis not present

## 2019-04-28 DIAGNOSIS — D631 Anemia in chronic kidney disease: Secondary | ICD-10-CM | POA: Diagnosis not present

## 2019-04-28 DIAGNOSIS — E1129 Type 2 diabetes mellitus with other diabetic kidney complication: Secondary | ICD-10-CM | POA: Diagnosis not present

## 2019-04-28 DIAGNOSIS — N2581 Secondary hyperparathyroidism of renal origin: Secondary | ICD-10-CM | POA: Diagnosis not present

## 2019-04-30 DIAGNOSIS — Z992 Dependence on renal dialysis: Secondary | ICD-10-CM | POA: Diagnosis not present

## 2019-04-30 DIAGNOSIS — D631 Anemia in chronic kidney disease: Secondary | ICD-10-CM | POA: Diagnosis not present

## 2019-04-30 DIAGNOSIS — N2581 Secondary hyperparathyroidism of renal origin: Secondary | ICD-10-CM | POA: Diagnosis not present

## 2019-04-30 DIAGNOSIS — N186 End stage renal disease: Secondary | ICD-10-CM | POA: Diagnosis not present

## 2019-04-30 DIAGNOSIS — E1129 Type 2 diabetes mellitus with other diabetic kidney complication: Secondary | ICD-10-CM | POA: Diagnosis not present

## 2019-05-01 DIAGNOSIS — F200 Paranoid schizophrenia: Secondary | ICD-10-CM | POA: Diagnosis not present

## 2019-05-02 DIAGNOSIS — D631 Anemia in chronic kidney disease: Secondary | ICD-10-CM | POA: Diagnosis not present

## 2019-05-02 DIAGNOSIS — N186 End stage renal disease: Secondary | ICD-10-CM | POA: Diagnosis not present

## 2019-05-02 DIAGNOSIS — N2581 Secondary hyperparathyroidism of renal origin: Secondary | ICD-10-CM | POA: Diagnosis not present

## 2019-05-02 DIAGNOSIS — E1129 Type 2 diabetes mellitus with other diabetic kidney complication: Secondary | ICD-10-CM | POA: Diagnosis not present

## 2019-05-02 DIAGNOSIS — Z992 Dependence on renal dialysis: Secondary | ICD-10-CM | POA: Diagnosis not present

## 2019-05-05 DIAGNOSIS — Z992 Dependence on renal dialysis: Secondary | ICD-10-CM | POA: Diagnosis not present

## 2019-05-05 DIAGNOSIS — D631 Anemia in chronic kidney disease: Secondary | ICD-10-CM | POA: Diagnosis not present

## 2019-05-05 DIAGNOSIS — E1129 Type 2 diabetes mellitus with other diabetic kidney complication: Secondary | ICD-10-CM | POA: Diagnosis not present

## 2019-05-05 DIAGNOSIS — N186 End stage renal disease: Secondary | ICD-10-CM | POA: Diagnosis not present

## 2019-05-05 DIAGNOSIS — N2581 Secondary hyperparathyroidism of renal origin: Secondary | ICD-10-CM | POA: Diagnosis not present

## 2019-05-07 DIAGNOSIS — N186 End stage renal disease: Secondary | ICD-10-CM | POA: Diagnosis not present

## 2019-05-07 DIAGNOSIS — Z992 Dependence on renal dialysis: Secondary | ICD-10-CM | POA: Diagnosis not present

## 2019-05-07 DIAGNOSIS — N2581 Secondary hyperparathyroidism of renal origin: Secondary | ICD-10-CM | POA: Diagnosis not present

## 2019-05-07 DIAGNOSIS — D631 Anemia in chronic kidney disease: Secondary | ICD-10-CM | POA: Diagnosis not present

## 2019-05-07 DIAGNOSIS — E1129 Type 2 diabetes mellitus with other diabetic kidney complication: Secondary | ICD-10-CM | POA: Diagnosis not present

## 2019-05-08 DIAGNOSIS — N186 End stage renal disease: Secondary | ICD-10-CM | POA: Diagnosis not present

## 2019-05-08 DIAGNOSIS — E1122 Type 2 diabetes mellitus with diabetic chronic kidney disease: Secondary | ICD-10-CM | POA: Diagnosis not present

## 2019-05-08 DIAGNOSIS — E782 Mixed hyperlipidemia: Secondary | ICD-10-CM | POA: Diagnosis not present

## 2019-05-08 DIAGNOSIS — I10 Essential (primary) hypertension: Secondary | ICD-10-CM | POA: Diagnosis not present

## 2019-05-09 DIAGNOSIS — N186 End stage renal disease: Secondary | ICD-10-CM | POA: Diagnosis not present

## 2019-05-09 DIAGNOSIS — E1129 Type 2 diabetes mellitus with other diabetic kidney complication: Secondary | ICD-10-CM | POA: Diagnosis not present

## 2019-05-09 DIAGNOSIS — Z992 Dependence on renal dialysis: Secondary | ICD-10-CM | POA: Diagnosis not present

## 2019-05-09 DIAGNOSIS — D631 Anemia in chronic kidney disease: Secondary | ICD-10-CM | POA: Diagnosis not present

## 2019-05-09 DIAGNOSIS — N2581 Secondary hyperparathyroidism of renal origin: Secondary | ICD-10-CM | POA: Diagnosis not present

## 2019-05-12 DIAGNOSIS — Z992 Dependence on renal dialysis: Secondary | ICD-10-CM | POA: Diagnosis not present

## 2019-05-12 DIAGNOSIS — N2581 Secondary hyperparathyroidism of renal origin: Secondary | ICD-10-CM | POA: Diagnosis not present

## 2019-05-12 DIAGNOSIS — N186 End stage renal disease: Secondary | ICD-10-CM | POA: Diagnosis not present

## 2019-05-12 DIAGNOSIS — E1129 Type 2 diabetes mellitus with other diabetic kidney complication: Secondary | ICD-10-CM | POA: Diagnosis not present

## 2019-05-12 DIAGNOSIS — D631 Anemia in chronic kidney disease: Secondary | ICD-10-CM | POA: Diagnosis not present

## 2019-05-13 DIAGNOSIS — I10 Essential (primary) hypertension: Secondary | ICD-10-CM | POA: Diagnosis not present

## 2019-05-13 DIAGNOSIS — N186 End stage renal disease: Secondary | ICD-10-CM | POA: Diagnosis not present

## 2019-05-13 DIAGNOSIS — E785 Hyperlipidemia, unspecified: Secondary | ICD-10-CM | POA: Diagnosis not present

## 2019-05-13 DIAGNOSIS — E1122 Type 2 diabetes mellitus with diabetic chronic kidney disease: Secondary | ICD-10-CM | POA: Diagnosis not present

## 2019-05-14 DIAGNOSIS — D631 Anemia in chronic kidney disease: Secondary | ICD-10-CM | POA: Diagnosis not present

## 2019-05-14 DIAGNOSIS — N186 End stage renal disease: Secondary | ICD-10-CM | POA: Diagnosis not present

## 2019-05-14 DIAGNOSIS — N2581 Secondary hyperparathyroidism of renal origin: Secondary | ICD-10-CM | POA: Diagnosis not present

## 2019-05-14 DIAGNOSIS — E1129 Type 2 diabetes mellitus with other diabetic kidney complication: Secondary | ICD-10-CM | POA: Diagnosis not present

## 2019-05-14 DIAGNOSIS — Z992 Dependence on renal dialysis: Secondary | ICD-10-CM | POA: Diagnosis not present

## 2019-05-16 DIAGNOSIS — E1129 Type 2 diabetes mellitus with other diabetic kidney complication: Secondary | ICD-10-CM | POA: Diagnosis not present

## 2019-05-16 DIAGNOSIS — D631 Anemia in chronic kidney disease: Secondary | ICD-10-CM | POA: Diagnosis not present

## 2019-05-16 DIAGNOSIS — Z992 Dependence on renal dialysis: Secondary | ICD-10-CM | POA: Diagnosis not present

## 2019-05-16 DIAGNOSIS — N186 End stage renal disease: Secondary | ICD-10-CM | POA: Diagnosis not present

## 2019-05-16 DIAGNOSIS — N2581 Secondary hyperparathyroidism of renal origin: Secondary | ICD-10-CM | POA: Diagnosis not present

## 2019-05-19 DIAGNOSIS — D631 Anemia in chronic kidney disease: Secondary | ICD-10-CM | POA: Diagnosis not present

## 2019-05-19 DIAGNOSIS — Z992 Dependence on renal dialysis: Secondary | ICD-10-CM | POA: Diagnosis not present

## 2019-05-19 DIAGNOSIS — N2581 Secondary hyperparathyroidism of renal origin: Secondary | ICD-10-CM | POA: Diagnosis not present

## 2019-05-19 DIAGNOSIS — E1129 Type 2 diabetes mellitus with other diabetic kidney complication: Secondary | ICD-10-CM | POA: Diagnosis not present

## 2019-05-19 DIAGNOSIS — N186 End stage renal disease: Secondary | ICD-10-CM | POA: Diagnosis not present

## 2019-05-21 DIAGNOSIS — N186 End stage renal disease: Secondary | ICD-10-CM | POA: Diagnosis not present

## 2019-05-21 DIAGNOSIS — D631 Anemia in chronic kidney disease: Secondary | ICD-10-CM | POA: Diagnosis not present

## 2019-05-21 DIAGNOSIS — Z992 Dependence on renal dialysis: Secondary | ICD-10-CM | POA: Diagnosis not present

## 2019-05-21 DIAGNOSIS — N2581 Secondary hyperparathyroidism of renal origin: Secondary | ICD-10-CM | POA: Diagnosis not present

## 2019-05-21 DIAGNOSIS — E1129 Type 2 diabetes mellitus with other diabetic kidney complication: Secondary | ICD-10-CM | POA: Diagnosis not present

## 2019-05-23 DIAGNOSIS — N2581 Secondary hyperparathyroidism of renal origin: Secondary | ICD-10-CM | POA: Diagnosis not present

## 2019-05-23 DIAGNOSIS — Z992 Dependence on renal dialysis: Secondary | ICD-10-CM | POA: Diagnosis not present

## 2019-05-23 DIAGNOSIS — E1129 Type 2 diabetes mellitus with other diabetic kidney complication: Secondary | ICD-10-CM | POA: Diagnosis not present

## 2019-05-23 DIAGNOSIS — N186 End stage renal disease: Secondary | ICD-10-CM | POA: Diagnosis not present

## 2019-05-23 DIAGNOSIS — D631 Anemia in chronic kidney disease: Secondary | ICD-10-CM | POA: Diagnosis not present

## 2019-05-26 DIAGNOSIS — F200 Paranoid schizophrenia: Secondary | ICD-10-CM | POA: Diagnosis not present

## 2019-05-26 DIAGNOSIS — E1129 Type 2 diabetes mellitus with other diabetic kidney complication: Secondary | ICD-10-CM | POA: Diagnosis not present

## 2019-05-26 DIAGNOSIS — N2581 Secondary hyperparathyroidism of renal origin: Secondary | ICD-10-CM | POA: Diagnosis not present

## 2019-05-26 DIAGNOSIS — N186 End stage renal disease: Secondary | ICD-10-CM | POA: Diagnosis not present

## 2019-05-26 DIAGNOSIS — D631 Anemia in chronic kidney disease: Secondary | ICD-10-CM | POA: Diagnosis not present

## 2019-05-26 DIAGNOSIS — Z992 Dependence on renal dialysis: Secondary | ICD-10-CM | POA: Diagnosis not present

## 2019-05-28 DIAGNOSIS — N186 End stage renal disease: Secondary | ICD-10-CM | POA: Diagnosis not present

## 2019-05-28 DIAGNOSIS — Z992 Dependence on renal dialysis: Secondary | ICD-10-CM | POA: Diagnosis not present

## 2019-05-28 DIAGNOSIS — E1129 Type 2 diabetes mellitus with other diabetic kidney complication: Secondary | ICD-10-CM | POA: Diagnosis not present

## 2019-05-28 DIAGNOSIS — D631 Anemia in chronic kidney disease: Secondary | ICD-10-CM | POA: Diagnosis not present

## 2019-05-28 DIAGNOSIS — N2581 Secondary hyperparathyroidism of renal origin: Secondary | ICD-10-CM | POA: Diagnosis not present

## 2019-05-30 DIAGNOSIS — N2581 Secondary hyperparathyroidism of renal origin: Secondary | ICD-10-CM | POA: Diagnosis not present

## 2019-05-30 DIAGNOSIS — N186 End stage renal disease: Secondary | ICD-10-CM | POA: Diagnosis not present

## 2019-05-30 DIAGNOSIS — D631 Anemia in chronic kidney disease: Secondary | ICD-10-CM | POA: Diagnosis not present

## 2019-05-30 DIAGNOSIS — E1129 Type 2 diabetes mellitus with other diabetic kidney complication: Secondary | ICD-10-CM | POA: Diagnosis not present

## 2019-05-30 DIAGNOSIS — Z992 Dependence on renal dialysis: Secondary | ICD-10-CM | POA: Diagnosis not present

## 2019-06-02 DIAGNOSIS — N186 End stage renal disease: Secondary | ICD-10-CM | POA: Diagnosis not present

## 2019-06-02 DIAGNOSIS — N2581 Secondary hyperparathyroidism of renal origin: Secondary | ICD-10-CM | POA: Diagnosis not present

## 2019-06-02 DIAGNOSIS — Z992 Dependence on renal dialysis: Secondary | ICD-10-CM | POA: Diagnosis not present

## 2019-06-02 DIAGNOSIS — E1129 Type 2 diabetes mellitus with other diabetic kidney complication: Secondary | ICD-10-CM | POA: Diagnosis not present

## 2019-06-02 DIAGNOSIS — D631 Anemia in chronic kidney disease: Secondary | ICD-10-CM | POA: Diagnosis not present

## 2019-06-04 DIAGNOSIS — D631 Anemia in chronic kidney disease: Secondary | ICD-10-CM | POA: Diagnosis not present

## 2019-06-04 DIAGNOSIS — Z992 Dependence on renal dialysis: Secondary | ICD-10-CM | POA: Diagnosis not present

## 2019-06-04 DIAGNOSIS — N2581 Secondary hyperparathyroidism of renal origin: Secondary | ICD-10-CM | POA: Diagnosis not present

## 2019-06-04 DIAGNOSIS — N186 End stage renal disease: Secondary | ICD-10-CM | POA: Diagnosis not present

## 2019-06-04 DIAGNOSIS — E1129 Type 2 diabetes mellitus with other diabetic kidney complication: Secondary | ICD-10-CM | POA: Diagnosis not present

## 2019-06-05 DIAGNOSIS — I10 Essential (primary) hypertension: Secondary | ICD-10-CM | POA: Diagnosis not present

## 2019-06-05 DIAGNOSIS — E1122 Type 2 diabetes mellitus with diabetic chronic kidney disease: Secondary | ICD-10-CM | POA: Diagnosis not present

## 2019-06-05 DIAGNOSIS — E785 Hyperlipidemia, unspecified: Secondary | ICD-10-CM | POA: Diagnosis not present

## 2019-06-05 DIAGNOSIS — N186 End stage renal disease: Secondary | ICD-10-CM | POA: Diagnosis not present

## 2019-06-06 DIAGNOSIS — N2581 Secondary hyperparathyroidism of renal origin: Secondary | ICD-10-CM | POA: Diagnosis not present

## 2019-06-06 DIAGNOSIS — D631 Anemia in chronic kidney disease: Secondary | ICD-10-CM | POA: Diagnosis not present

## 2019-06-06 DIAGNOSIS — Z992 Dependence on renal dialysis: Secondary | ICD-10-CM | POA: Diagnosis not present

## 2019-06-06 DIAGNOSIS — N186 End stage renal disease: Secondary | ICD-10-CM | POA: Diagnosis not present

## 2019-06-06 DIAGNOSIS — E1129 Type 2 diabetes mellitus with other diabetic kidney complication: Secondary | ICD-10-CM | POA: Diagnosis not present

## 2019-06-09 DIAGNOSIS — E1129 Type 2 diabetes mellitus with other diabetic kidney complication: Secondary | ICD-10-CM | POA: Diagnosis not present

## 2019-06-09 DIAGNOSIS — N2581 Secondary hyperparathyroidism of renal origin: Secondary | ICD-10-CM | POA: Diagnosis not present

## 2019-06-09 DIAGNOSIS — N186 End stage renal disease: Secondary | ICD-10-CM | POA: Diagnosis not present

## 2019-06-09 DIAGNOSIS — Z992 Dependence on renal dialysis: Secondary | ICD-10-CM | POA: Diagnosis not present

## 2019-06-09 DIAGNOSIS — D631 Anemia in chronic kidney disease: Secondary | ICD-10-CM | POA: Diagnosis not present

## 2019-06-11 DIAGNOSIS — N2581 Secondary hyperparathyroidism of renal origin: Secondary | ICD-10-CM | POA: Diagnosis not present

## 2019-06-11 DIAGNOSIS — N186 End stage renal disease: Secondary | ICD-10-CM | POA: Diagnosis not present

## 2019-06-11 DIAGNOSIS — D631 Anemia in chronic kidney disease: Secondary | ICD-10-CM | POA: Diagnosis not present

## 2019-06-11 DIAGNOSIS — E1129 Type 2 diabetes mellitus with other diabetic kidney complication: Secondary | ICD-10-CM | POA: Diagnosis not present

## 2019-06-11 DIAGNOSIS — Z992 Dependence on renal dialysis: Secondary | ICD-10-CM | POA: Diagnosis not present

## 2019-06-13 DIAGNOSIS — E785 Hyperlipidemia, unspecified: Secondary | ICD-10-CM | POA: Diagnosis not present

## 2019-06-13 DIAGNOSIS — N2581 Secondary hyperparathyroidism of renal origin: Secondary | ICD-10-CM | POA: Diagnosis not present

## 2019-06-13 DIAGNOSIS — D631 Anemia in chronic kidney disease: Secondary | ICD-10-CM | POA: Diagnosis not present

## 2019-06-13 DIAGNOSIS — E1129 Type 2 diabetes mellitus with other diabetic kidney complication: Secondary | ICD-10-CM | POA: Diagnosis not present

## 2019-06-13 DIAGNOSIS — N186 End stage renal disease: Secondary | ICD-10-CM | POA: Diagnosis not present

## 2019-06-13 DIAGNOSIS — E1122 Type 2 diabetes mellitus with diabetic chronic kidney disease: Secondary | ICD-10-CM | POA: Diagnosis not present

## 2019-06-13 DIAGNOSIS — Z992 Dependence on renal dialysis: Secondary | ICD-10-CM | POA: Diagnosis not present

## 2019-06-13 DIAGNOSIS — I10 Essential (primary) hypertension: Secondary | ICD-10-CM | POA: Diagnosis not present

## 2019-06-14 DIAGNOSIS — F419 Anxiety disorder, unspecified: Secondary | ICD-10-CM | POA: Diagnosis not present

## 2019-06-14 DIAGNOSIS — R2689 Other abnormalities of gait and mobility: Secondary | ICD-10-CM | POA: Diagnosis not present

## 2019-06-14 DIAGNOSIS — N186 End stage renal disease: Secondary | ICD-10-CM | POA: Diagnosis not present

## 2019-06-14 DIAGNOSIS — Z89411 Acquired absence of right great toe: Secondary | ICD-10-CM | POA: Diagnosis not present

## 2019-06-14 DIAGNOSIS — Z741 Need for assistance with personal care: Secondary | ICD-10-CM | POA: Diagnosis not present

## 2019-06-16 DIAGNOSIS — F419 Anxiety disorder, unspecified: Secondary | ICD-10-CM | POA: Diagnosis not present

## 2019-06-16 DIAGNOSIS — N186 End stage renal disease: Secondary | ICD-10-CM | POA: Diagnosis not present

## 2019-06-16 DIAGNOSIS — Z741 Need for assistance with personal care: Secondary | ICD-10-CM | POA: Diagnosis not present

## 2019-06-16 DIAGNOSIS — Z89411 Acquired absence of right great toe: Secondary | ICD-10-CM | POA: Diagnosis not present

## 2019-06-16 DIAGNOSIS — Z992 Dependence on renal dialysis: Secondary | ICD-10-CM | POA: Diagnosis not present

## 2019-06-16 DIAGNOSIS — R2689 Other abnormalities of gait and mobility: Secondary | ICD-10-CM | POA: Diagnosis not present

## 2019-06-16 DIAGNOSIS — N2581 Secondary hyperparathyroidism of renal origin: Secondary | ICD-10-CM | POA: Diagnosis not present

## 2019-06-16 DIAGNOSIS — D631 Anemia in chronic kidney disease: Secondary | ICD-10-CM | POA: Diagnosis not present

## 2019-06-16 DIAGNOSIS — E1129 Type 2 diabetes mellitus with other diabetic kidney complication: Secondary | ICD-10-CM | POA: Diagnosis not present

## 2019-06-17 DIAGNOSIS — N186 End stage renal disease: Secondary | ICD-10-CM | POA: Diagnosis not present

## 2019-06-17 DIAGNOSIS — Z89411 Acquired absence of right great toe: Secondary | ICD-10-CM | POA: Diagnosis not present

## 2019-06-17 DIAGNOSIS — Z741 Need for assistance with personal care: Secondary | ICD-10-CM | POA: Diagnosis not present

## 2019-06-17 DIAGNOSIS — F419 Anxiety disorder, unspecified: Secondary | ICD-10-CM | POA: Diagnosis not present

## 2019-06-17 DIAGNOSIS — R2689 Other abnormalities of gait and mobility: Secondary | ICD-10-CM | POA: Diagnosis not present

## 2019-06-18 DIAGNOSIS — Z992 Dependence on renal dialysis: Secondary | ICD-10-CM | POA: Diagnosis not present

## 2019-06-18 DIAGNOSIS — D631 Anemia in chronic kidney disease: Secondary | ICD-10-CM | POA: Diagnosis not present

## 2019-06-18 DIAGNOSIS — E1129 Type 2 diabetes mellitus with other diabetic kidney complication: Secondary | ICD-10-CM | POA: Diagnosis not present

## 2019-06-18 DIAGNOSIS — N186 End stage renal disease: Secondary | ICD-10-CM | POA: Diagnosis not present

## 2019-06-18 DIAGNOSIS — N2581 Secondary hyperparathyroidism of renal origin: Secondary | ICD-10-CM | POA: Diagnosis not present

## 2019-06-19 DIAGNOSIS — Z89411 Acquired absence of right great toe: Secondary | ICD-10-CM | POA: Diagnosis not present

## 2019-06-19 DIAGNOSIS — Z741 Need for assistance with personal care: Secondary | ICD-10-CM | POA: Diagnosis not present

## 2019-06-19 DIAGNOSIS — N186 End stage renal disease: Secondary | ICD-10-CM | POA: Diagnosis not present

## 2019-06-19 DIAGNOSIS — F419 Anxiety disorder, unspecified: Secondary | ICD-10-CM | POA: Diagnosis not present

## 2019-06-19 DIAGNOSIS — R2689 Other abnormalities of gait and mobility: Secondary | ICD-10-CM | POA: Diagnosis not present

## 2019-06-20 DIAGNOSIS — Z89411 Acquired absence of right great toe: Secondary | ICD-10-CM | POA: Diagnosis not present

## 2019-06-20 DIAGNOSIS — Z741 Need for assistance with personal care: Secondary | ICD-10-CM | POA: Diagnosis not present

## 2019-06-20 DIAGNOSIS — R2689 Other abnormalities of gait and mobility: Secondary | ICD-10-CM | POA: Diagnosis not present

## 2019-06-20 DIAGNOSIS — E1129 Type 2 diabetes mellitus with other diabetic kidney complication: Secondary | ICD-10-CM | POA: Diagnosis not present

## 2019-06-20 DIAGNOSIS — F419 Anxiety disorder, unspecified: Secondary | ICD-10-CM | POA: Diagnosis not present

## 2019-06-20 DIAGNOSIS — D631 Anemia in chronic kidney disease: Secondary | ICD-10-CM | POA: Diagnosis not present

## 2019-06-20 DIAGNOSIS — N2581 Secondary hyperparathyroidism of renal origin: Secondary | ICD-10-CM | POA: Diagnosis not present

## 2019-06-20 DIAGNOSIS — N186 End stage renal disease: Secondary | ICD-10-CM | POA: Diagnosis not present

## 2019-06-20 DIAGNOSIS — Z992 Dependence on renal dialysis: Secondary | ICD-10-CM | POA: Diagnosis not present

## 2019-06-22 DIAGNOSIS — N186 End stage renal disease: Secondary | ICD-10-CM | POA: Diagnosis not present

## 2019-06-22 DIAGNOSIS — F419 Anxiety disorder, unspecified: Secondary | ICD-10-CM | POA: Diagnosis not present

## 2019-06-22 DIAGNOSIS — Z89411 Acquired absence of right great toe: Secondary | ICD-10-CM | POA: Diagnosis not present

## 2019-06-22 DIAGNOSIS — Z741 Need for assistance with personal care: Secondary | ICD-10-CM | POA: Diagnosis not present

## 2019-06-22 DIAGNOSIS — R2689 Other abnormalities of gait and mobility: Secondary | ICD-10-CM | POA: Diagnosis not present

## 2019-06-23 DIAGNOSIS — E1122 Type 2 diabetes mellitus with diabetic chronic kidney disease: Secondary | ICD-10-CM | POA: Diagnosis not present

## 2019-06-23 DIAGNOSIS — R2689 Other abnormalities of gait and mobility: Secondary | ICD-10-CM | POA: Diagnosis not present

## 2019-06-23 DIAGNOSIS — D631 Anemia in chronic kidney disease: Secondary | ICD-10-CM | POA: Diagnosis not present

## 2019-06-23 DIAGNOSIS — I10 Essential (primary) hypertension: Secondary | ICD-10-CM | POA: Diagnosis not present

## 2019-06-23 DIAGNOSIS — E1129 Type 2 diabetes mellitus with other diabetic kidney complication: Secondary | ICD-10-CM | POA: Diagnosis not present

## 2019-06-23 DIAGNOSIS — Z89411 Acquired absence of right great toe: Secondary | ICD-10-CM | POA: Diagnosis not present

## 2019-06-23 DIAGNOSIS — N186 End stage renal disease: Secondary | ICD-10-CM | POA: Diagnosis not present

## 2019-06-23 DIAGNOSIS — Z741 Need for assistance with personal care: Secondary | ICD-10-CM | POA: Diagnosis not present

## 2019-06-23 DIAGNOSIS — F419 Anxiety disorder, unspecified: Secondary | ICD-10-CM | POA: Diagnosis not present

## 2019-06-23 DIAGNOSIS — E785 Hyperlipidemia, unspecified: Secondary | ICD-10-CM | POA: Diagnosis not present

## 2019-06-23 DIAGNOSIS — N2581 Secondary hyperparathyroidism of renal origin: Secondary | ICD-10-CM | POA: Diagnosis not present

## 2019-06-23 DIAGNOSIS — Z992 Dependence on renal dialysis: Secondary | ICD-10-CM | POA: Diagnosis not present

## 2019-06-25 DIAGNOSIS — M6281 Muscle weakness (generalized): Secondary | ICD-10-CM | POA: Diagnosis not present

## 2019-06-25 DIAGNOSIS — D509 Iron deficiency anemia, unspecified: Secondary | ICD-10-CM | POA: Diagnosis not present

## 2019-06-25 DIAGNOSIS — Z992 Dependence on renal dialysis: Secondary | ICD-10-CM | POA: Diagnosis not present

## 2019-06-25 DIAGNOSIS — N186 End stage renal disease: Secondary | ICD-10-CM | POA: Diagnosis not present

## 2019-06-25 DIAGNOSIS — M2450 Contracture, unspecified joint: Secondary | ICD-10-CM | POA: Diagnosis not present

## 2019-06-25 DIAGNOSIS — Z89411 Acquired absence of right great toe: Secondary | ICD-10-CM | POA: Diagnosis not present

## 2019-06-25 DIAGNOSIS — N2581 Secondary hyperparathyroidism of renal origin: Secondary | ICD-10-CM | POA: Diagnosis not present

## 2019-06-25 DIAGNOSIS — F419 Anxiety disorder, unspecified: Secondary | ICD-10-CM | POA: Diagnosis not present

## 2019-06-25 DIAGNOSIS — D631 Anemia in chronic kidney disease: Secondary | ICD-10-CM | POA: Diagnosis not present

## 2019-06-25 DIAGNOSIS — E1129 Type 2 diabetes mellitus with other diabetic kidney complication: Secondary | ICD-10-CM | POA: Diagnosis not present

## 2019-06-25 DIAGNOSIS — R2689 Other abnormalities of gait and mobility: Secondary | ICD-10-CM | POA: Diagnosis not present

## 2019-06-25 DIAGNOSIS — F200 Paranoid schizophrenia: Secondary | ICD-10-CM | POA: Diagnosis not present

## 2019-06-25 DIAGNOSIS — G894 Chronic pain syndrome: Secondary | ICD-10-CM | POA: Diagnosis not present

## 2019-06-25 DIAGNOSIS — Z741 Need for assistance with personal care: Secondary | ICD-10-CM | POA: Diagnosis not present

## 2019-06-26 DIAGNOSIS — M6281 Muscle weakness (generalized): Secondary | ICD-10-CM | POA: Diagnosis not present

## 2019-06-26 DIAGNOSIS — M2450 Contracture, unspecified joint: Secondary | ICD-10-CM | POA: Diagnosis not present

## 2019-06-26 DIAGNOSIS — F200 Paranoid schizophrenia: Secondary | ICD-10-CM | POA: Diagnosis not present

## 2019-06-26 DIAGNOSIS — G894 Chronic pain syndrome: Secondary | ICD-10-CM | POA: Diagnosis not present

## 2019-06-26 DIAGNOSIS — F419 Anxiety disorder, unspecified: Secondary | ICD-10-CM | POA: Diagnosis not present

## 2019-06-26 DIAGNOSIS — Z741 Need for assistance with personal care: Secondary | ICD-10-CM | POA: Diagnosis not present

## 2019-06-27 DIAGNOSIS — D631 Anemia in chronic kidney disease: Secondary | ICD-10-CM | POA: Diagnosis not present

## 2019-06-27 DIAGNOSIS — E1129 Type 2 diabetes mellitus with other diabetic kidney complication: Secondary | ICD-10-CM | POA: Diagnosis not present

## 2019-06-27 DIAGNOSIS — N2581 Secondary hyperparathyroidism of renal origin: Secondary | ICD-10-CM | POA: Diagnosis not present

## 2019-06-27 DIAGNOSIS — N186 End stage renal disease: Secondary | ICD-10-CM | POA: Diagnosis not present

## 2019-06-27 DIAGNOSIS — F419 Anxiety disorder, unspecified: Secondary | ICD-10-CM | POA: Diagnosis not present

## 2019-06-27 DIAGNOSIS — M2450 Contracture, unspecified joint: Secondary | ICD-10-CM | POA: Diagnosis not present

## 2019-06-27 DIAGNOSIS — Z992 Dependence on renal dialysis: Secondary | ICD-10-CM | POA: Diagnosis not present

## 2019-06-27 DIAGNOSIS — M6281 Muscle weakness (generalized): Secondary | ICD-10-CM | POA: Diagnosis not present

## 2019-06-27 DIAGNOSIS — Z741 Need for assistance with personal care: Secondary | ICD-10-CM | POA: Diagnosis not present

## 2019-06-27 DIAGNOSIS — F200 Paranoid schizophrenia: Secondary | ICD-10-CM | POA: Diagnosis not present

## 2019-06-27 DIAGNOSIS — G894 Chronic pain syndrome: Secondary | ICD-10-CM | POA: Diagnosis not present

## 2019-06-27 DIAGNOSIS — D509 Iron deficiency anemia, unspecified: Secondary | ICD-10-CM | POA: Diagnosis not present

## 2019-06-30 DIAGNOSIS — F419 Anxiety disorder, unspecified: Secondary | ICD-10-CM | POA: Diagnosis not present

## 2019-06-30 DIAGNOSIS — E1129 Type 2 diabetes mellitus with other diabetic kidney complication: Secondary | ICD-10-CM | POA: Diagnosis not present

## 2019-06-30 DIAGNOSIS — M6281 Muscle weakness (generalized): Secondary | ICD-10-CM | POA: Diagnosis not present

## 2019-06-30 DIAGNOSIS — M2450 Contracture, unspecified joint: Secondary | ICD-10-CM | POA: Diagnosis not present

## 2019-06-30 DIAGNOSIS — Z992 Dependence on renal dialysis: Secondary | ICD-10-CM | POA: Diagnosis not present

## 2019-06-30 DIAGNOSIS — D631 Anemia in chronic kidney disease: Secondary | ICD-10-CM | POA: Diagnosis not present

## 2019-06-30 DIAGNOSIS — N2581 Secondary hyperparathyroidism of renal origin: Secondary | ICD-10-CM | POA: Diagnosis not present

## 2019-06-30 DIAGNOSIS — N186 End stage renal disease: Secondary | ICD-10-CM | POA: Diagnosis not present

## 2019-06-30 DIAGNOSIS — F200 Paranoid schizophrenia: Secondary | ICD-10-CM | POA: Diagnosis not present

## 2019-06-30 DIAGNOSIS — G894 Chronic pain syndrome: Secondary | ICD-10-CM | POA: Diagnosis not present

## 2019-06-30 DIAGNOSIS — Z741 Need for assistance with personal care: Secondary | ICD-10-CM | POA: Diagnosis not present

## 2019-06-30 DIAGNOSIS — D509 Iron deficiency anemia, unspecified: Secondary | ICD-10-CM | POA: Diagnosis not present

## 2019-07-01 DIAGNOSIS — M6281 Muscle weakness (generalized): Secondary | ICD-10-CM | POA: Diagnosis not present

## 2019-07-01 DIAGNOSIS — G894 Chronic pain syndrome: Secondary | ICD-10-CM | POA: Diagnosis not present

## 2019-07-01 DIAGNOSIS — Z741 Need for assistance with personal care: Secondary | ICD-10-CM | POA: Diagnosis not present

## 2019-07-01 DIAGNOSIS — M2450 Contracture, unspecified joint: Secondary | ICD-10-CM | POA: Diagnosis not present

## 2019-07-01 DIAGNOSIS — F419 Anxiety disorder, unspecified: Secondary | ICD-10-CM | POA: Diagnosis not present

## 2019-07-01 DIAGNOSIS — F200 Paranoid schizophrenia: Secondary | ICD-10-CM | POA: Diagnosis not present

## 2019-07-02 DIAGNOSIS — D631 Anemia in chronic kidney disease: Secondary | ICD-10-CM | POA: Diagnosis not present

## 2019-07-02 DIAGNOSIS — N186 End stage renal disease: Secondary | ICD-10-CM | POA: Diagnosis not present

## 2019-07-02 DIAGNOSIS — D509 Iron deficiency anemia, unspecified: Secondary | ICD-10-CM | POA: Diagnosis not present

## 2019-07-02 DIAGNOSIS — E1129 Type 2 diabetes mellitus with other diabetic kidney complication: Secondary | ICD-10-CM | POA: Diagnosis not present

## 2019-07-02 DIAGNOSIS — N2581 Secondary hyperparathyroidism of renal origin: Secondary | ICD-10-CM | POA: Diagnosis not present

## 2019-07-02 DIAGNOSIS — Z992 Dependence on renal dialysis: Secondary | ICD-10-CM | POA: Diagnosis not present

## 2019-07-02 DIAGNOSIS — F200 Paranoid schizophrenia: Secondary | ICD-10-CM | POA: Diagnosis not present

## 2019-07-03 DIAGNOSIS — F200 Paranoid schizophrenia: Secondary | ICD-10-CM | POA: Diagnosis not present

## 2019-07-03 DIAGNOSIS — M6281 Muscle weakness (generalized): Secondary | ICD-10-CM | POA: Diagnosis not present

## 2019-07-03 DIAGNOSIS — N186 End stage renal disease: Secondary | ICD-10-CM | POA: Diagnosis not present

## 2019-07-03 DIAGNOSIS — I10 Essential (primary) hypertension: Secondary | ICD-10-CM | POA: Diagnosis not present

## 2019-07-03 DIAGNOSIS — Z741 Need for assistance with personal care: Secondary | ICD-10-CM | POA: Diagnosis not present

## 2019-07-03 DIAGNOSIS — M2450 Contracture, unspecified joint: Secondary | ICD-10-CM | POA: Diagnosis not present

## 2019-07-03 DIAGNOSIS — E1122 Type 2 diabetes mellitus with diabetic chronic kidney disease: Secondary | ICD-10-CM | POA: Diagnosis not present

## 2019-07-03 DIAGNOSIS — G894 Chronic pain syndrome: Secondary | ICD-10-CM | POA: Diagnosis not present

## 2019-07-03 DIAGNOSIS — E785 Hyperlipidemia, unspecified: Secondary | ICD-10-CM | POA: Diagnosis not present

## 2019-07-03 DIAGNOSIS — F419 Anxiety disorder, unspecified: Secondary | ICD-10-CM | POA: Diagnosis not present

## 2019-07-04 DIAGNOSIS — M2450 Contracture, unspecified joint: Secondary | ICD-10-CM | POA: Diagnosis not present

## 2019-07-04 DIAGNOSIS — G894 Chronic pain syndrome: Secondary | ICD-10-CM | POA: Diagnosis not present

## 2019-07-04 DIAGNOSIS — E1129 Type 2 diabetes mellitus with other diabetic kidney complication: Secondary | ICD-10-CM | POA: Diagnosis not present

## 2019-07-04 DIAGNOSIS — Z741 Need for assistance with personal care: Secondary | ICD-10-CM | POA: Diagnosis not present

## 2019-07-04 DIAGNOSIS — M6281 Muscle weakness (generalized): Secondary | ICD-10-CM | POA: Diagnosis not present

## 2019-07-04 DIAGNOSIS — D509 Iron deficiency anemia, unspecified: Secondary | ICD-10-CM | POA: Diagnosis not present

## 2019-07-04 DIAGNOSIS — F200 Paranoid schizophrenia: Secondary | ICD-10-CM | POA: Diagnosis not present

## 2019-07-04 DIAGNOSIS — D631 Anemia in chronic kidney disease: Secondary | ICD-10-CM | POA: Diagnosis not present

## 2019-07-04 DIAGNOSIS — F419 Anxiety disorder, unspecified: Secondary | ICD-10-CM | POA: Diagnosis not present

## 2019-07-04 DIAGNOSIS — Z992 Dependence on renal dialysis: Secondary | ICD-10-CM | POA: Diagnosis not present

## 2019-07-04 DIAGNOSIS — N186 End stage renal disease: Secondary | ICD-10-CM | POA: Diagnosis not present

## 2019-07-04 DIAGNOSIS — N2581 Secondary hyperparathyroidism of renal origin: Secondary | ICD-10-CM | POA: Diagnosis not present

## 2019-07-07 DIAGNOSIS — M6281 Muscle weakness (generalized): Secondary | ICD-10-CM | POA: Diagnosis not present

## 2019-07-07 DIAGNOSIS — M2450 Contracture, unspecified joint: Secondary | ICD-10-CM | POA: Diagnosis not present

## 2019-07-07 DIAGNOSIS — F200 Paranoid schizophrenia: Secondary | ICD-10-CM | POA: Diagnosis not present

## 2019-07-07 DIAGNOSIS — D509 Iron deficiency anemia, unspecified: Secondary | ICD-10-CM | POA: Diagnosis not present

## 2019-07-07 DIAGNOSIS — N2581 Secondary hyperparathyroidism of renal origin: Secondary | ICD-10-CM | POA: Diagnosis not present

## 2019-07-07 DIAGNOSIS — Z992 Dependence on renal dialysis: Secondary | ICD-10-CM | POA: Diagnosis not present

## 2019-07-07 DIAGNOSIS — E1129 Type 2 diabetes mellitus with other diabetic kidney complication: Secondary | ICD-10-CM | POA: Diagnosis not present

## 2019-07-07 DIAGNOSIS — Z741 Need for assistance with personal care: Secondary | ICD-10-CM | POA: Diagnosis not present

## 2019-07-07 DIAGNOSIS — F419 Anxiety disorder, unspecified: Secondary | ICD-10-CM | POA: Diagnosis not present

## 2019-07-07 DIAGNOSIS — G894 Chronic pain syndrome: Secondary | ICD-10-CM | POA: Diagnosis not present

## 2019-07-07 DIAGNOSIS — N186 End stage renal disease: Secondary | ICD-10-CM | POA: Diagnosis not present

## 2019-07-07 DIAGNOSIS — D631 Anemia in chronic kidney disease: Secondary | ICD-10-CM | POA: Diagnosis not present

## 2019-07-08 DIAGNOSIS — M2450 Contracture, unspecified joint: Secondary | ICD-10-CM | POA: Diagnosis not present

## 2019-07-08 DIAGNOSIS — F419 Anxiety disorder, unspecified: Secondary | ICD-10-CM | POA: Diagnosis not present

## 2019-07-08 DIAGNOSIS — M6281 Muscle weakness (generalized): Secondary | ICD-10-CM | POA: Diagnosis not present

## 2019-07-08 DIAGNOSIS — Z741 Need for assistance with personal care: Secondary | ICD-10-CM | POA: Diagnosis not present

## 2019-07-08 DIAGNOSIS — G894 Chronic pain syndrome: Secondary | ICD-10-CM | POA: Diagnosis not present

## 2019-07-08 DIAGNOSIS — F200 Paranoid schizophrenia: Secondary | ICD-10-CM | POA: Diagnosis not present

## 2019-07-09 DIAGNOSIS — N186 End stage renal disease: Secondary | ICD-10-CM | POA: Diagnosis not present

## 2019-07-09 DIAGNOSIS — D509 Iron deficiency anemia, unspecified: Secondary | ICD-10-CM | POA: Diagnosis not present

## 2019-07-09 DIAGNOSIS — Z992 Dependence on renal dialysis: Secondary | ICD-10-CM | POA: Diagnosis not present

## 2019-07-09 DIAGNOSIS — N2581 Secondary hyperparathyroidism of renal origin: Secondary | ICD-10-CM | POA: Diagnosis not present

## 2019-07-09 DIAGNOSIS — E1129 Type 2 diabetes mellitus with other diabetic kidney complication: Secondary | ICD-10-CM | POA: Diagnosis not present

## 2019-07-09 DIAGNOSIS — D631 Anemia in chronic kidney disease: Secondary | ICD-10-CM | POA: Diagnosis not present

## 2019-07-10 DIAGNOSIS — G894 Chronic pain syndrome: Secondary | ICD-10-CM | POA: Diagnosis not present

## 2019-07-10 DIAGNOSIS — M2450 Contracture, unspecified joint: Secondary | ICD-10-CM | POA: Diagnosis not present

## 2019-07-10 DIAGNOSIS — M6281 Muscle weakness (generalized): Secondary | ICD-10-CM | POA: Diagnosis not present

## 2019-07-10 DIAGNOSIS — F419 Anxiety disorder, unspecified: Secondary | ICD-10-CM | POA: Diagnosis not present

## 2019-07-10 DIAGNOSIS — F200 Paranoid schizophrenia: Secondary | ICD-10-CM | POA: Diagnosis not present

## 2019-07-10 DIAGNOSIS — Z741 Need for assistance with personal care: Secondary | ICD-10-CM | POA: Diagnosis not present

## 2019-07-11 DIAGNOSIS — I10 Essential (primary) hypertension: Secondary | ICD-10-CM | POA: Diagnosis not present

## 2019-07-11 DIAGNOSIS — Z992 Dependence on renal dialysis: Secondary | ICD-10-CM | POA: Diagnosis not present

## 2019-07-11 DIAGNOSIS — E1129 Type 2 diabetes mellitus with other diabetic kidney complication: Secondary | ICD-10-CM | POA: Diagnosis not present

## 2019-07-11 DIAGNOSIS — D509 Iron deficiency anemia, unspecified: Secondary | ICD-10-CM | POA: Diagnosis not present

## 2019-07-11 DIAGNOSIS — E785 Hyperlipidemia, unspecified: Secondary | ICD-10-CM | POA: Diagnosis not present

## 2019-07-11 DIAGNOSIS — E1122 Type 2 diabetes mellitus with diabetic chronic kidney disease: Secondary | ICD-10-CM | POA: Diagnosis not present

## 2019-07-11 DIAGNOSIS — D631 Anemia in chronic kidney disease: Secondary | ICD-10-CM | POA: Diagnosis not present

## 2019-07-11 DIAGNOSIS — N186 End stage renal disease: Secondary | ICD-10-CM | POA: Diagnosis not present

## 2019-07-11 DIAGNOSIS — N2581 Secondary hyperparathyroidism of renal origin: Secondary | ICD-10-CM | POA: Diagnosis not present

## 2019-07-12 DIAGNOSIS — Z741 Need for assistance with personal care: Secondary | ICD-10-CM | POA: Diagnosis not present

## 2019-07-12 DIAGNOSIS — F419 Anxiety disorder, unspecified: Secondary | ICD-10-CM | POA: Diagnosis not present

## 2019-07-12 DIAGNOSIS — M6281 Muscle weakness (generalized): Secondary | ICD-10-CM | POA: Diagnosis not present

## 2019-07-12 DIAGNOSIS — G894 Chronic pain syndrome: Secondary | ICD-10-CM | POA: Diagnosis not present

## 2019-07-12 DIAGNOSIS — F200 Paranoid schizophrenia: Secondary | ICD-10-CM | POA: Diagnosis not present

## 2019-07-12 DIAGNOSIS — M2450 Contracture, unspecified joint: Secondary | ICD-10-CM | POA: Diagnosis not present

## 2019-07-14 DIAGNOSIS — N2581 Secondary hyperparathyroidism of renal origin: Secondary | ICD-10-CM | POA: Diagnosis not present

## 2019-07-14 DIAGNOSIS — N186 End stage renal disease: Secondary | ICD-10-CM | POA: Diagnosis not present

## 2019-07-14 DIAGNOSIS — E1129 Type 2 diabetes mellitus with other diabetic kidney complication: Secondary | ICD-10-CM | POA: Diagnosis not present

## 2019-07-14 DIAGNOSIS — D631 Anemia in chronic kidney disease: Secondary | ICD-10-CM | POA: Diagnosis not present

## 2019-07-14 DIAGNOSIS — Z992 Dependence on renal dialysis: Secondary | ICD-10-CM | POA: Diagnosis not present

## 2019-07-14 DIAGNOSIS — D509 Iron deficiency anemia, unspecified: Secondary | ICD-10-CM | POA: Diagnosis not present

## 2019-07-15 DIAGNOSIS — F200 Paranoid schizophrenia: Secondary | ICD-10-CM | POA: Diagnosis not present

## 2019-07-15 DIAGNOSIS — M2450 Contracture, unspecified joint: Secondary | ICD-10-CM | POA: Diagnosis not present

## 2019-07-15 DIAGNOSIS — G894 Chronic pain syndrome: Secondary | ICD-10-CM | POA: Diagnosis not present

## 2019-07-15 DIAGNOSIS — Z741 Need for assistance with personal care: Secondary | ICD-10-CM | POA: Diagnosis not present

## 2019-07-15 DIAGNOSIS — M6281 Muscle weakness (generalized): Secondary | ICD-10-CM | POA: Diagnosis not present

## 2019-07-15 DIAGNOSIS — F419 Anxiety disorder, unspecified: Secondary | ICD-10-CM | POA: Diagnosis not present

## 2019-07-16 DIAGNOSIS — N186 End stage renal disease: Secondary | ICD-10-CM | POA: Diagnosis not present

## 2019-07-16 DIAGNOSIS — Z992 Dependence on renal dialysis: Secondary | ICD-10-CM | POA: Diagnosis not present

## 2019-07-16 DIAGNOSIS — N2581 Secondary hyperparathyroidism of renal origin: Secondary | ICD-10-CM | POA: Diagnosis not present

## 2019-07-16 DIAGNOSIS — D631 Anemia in chronic kidney disease: Secondary | ICD-10-CM | POA: Diagnosis not present

## 2019-07-16 DIAGNOSIS — E1129 Type 2 diabetes mellitus with other diabetic kidney complication: Secondary | ICD-10-CM | POA: Diagnosis not present

## 2019-07-16 DIAGNOSIS — D509 Iron deficiency anemia, unspecified: Secondary | ICD-10-CM | POA: Diagnosis not present

## 2019-07-17 DIAGNOSIS — M2450 Contracture, unspecified joint: Secondary | ICD-10-CM | POA: Diagnosis not present

## 2019-07-17 DIAGNOSIS — Z741 Need for assistance with personal care: Secondary | ICD-10-CM | POA: Diagnosis not present

## 2019-07-17 DIAGNOSIS — F419 Anxiety disorder, unspecified: Secondary | ICD-10-CM | POA: Diagnosis not present

## 2019-07-17 DIAGNOSIS — G894 Chronic pain syndrome: Secondary | ICD-10-CM | POA: Diagnosis not present

## 2019-07-17 DIAGNOSIS — F200 Paranoid schizophrenia: Secondary | ICD-10-CM | POA: Diagnosis not present

## 2019-07-17 DIAGNOSIS — M6281 Muscle weakness (generalized): Secondary | ICD-10-CM | POA: Diagnosis not present

## 2019-07-18 DIAGNOSIS — E1129 Type 2 diabetes mellitus with other diabetic kidney complication: Secondary | ICD-10-CM | POA: Diagnosis not present

## 2019-07-18 DIAGNOSIS — N186 End stage renal disease: Secondary | ICD-10-CM | POA: Diagnosis not present

## 2019-07-18 DIAGNOSIS — D631 Anemia in chronic kidney disease: Secondary | ICD-10-CM | POA: Diagnosis not present

## 2019-07-18 DIAGNOSIS — Z992 Dependence on renal dialysis: Secondary | ICD-10-CM | POA: Diagnosis not present

## 2019-07-18 DIAGNOSIS — N2581 Secondary hyperparathyroidism of renal origin: Secondary | ICD-10-CM | POA: Diagnosis not present

## 2019-07-18 DIAGNOSIS — D509 Iron deficiency anemia, unspecified: Secondary | ICD-10-CM | POA: Diagnosis not present

## 2019-07-21 DIAGNOSIS — F419 Anxiety disorder, unspecified: Secondary | ICD-10-CM | POA: Diagnosis not present

## 2019-07-21 DIAGNOSIS — E1129 Type 2 diabetes mellitus with other diabetic kidney complication: Secondary | ICD-10-CM | POA: Diagnosis not present

## 2019-07-21 DIAGNOSIS — M6281 Muscle weakness (generalized): Secondary | ICD-10-CM | POA: Diagnosis not present

## 2019-07-21 DIAGNOSIS — F200 Paranoid schizophrenia: Secondary | ICD-10-CM | POA: Diagnosis not present

## 2019-07-21 DIAGNOSIS — N2581 Secondary hyperparathyroidism of renal origin: Secondary | ICD-10-CM | POA: Diagnosis not present

## 2019-07-21 DIAGNOSIS — G894 Chronic pain syndrome: Secondary | ICD-10-CM | POA: Diagnosis not present

## 2019-07-21 DIAGNOSIS — D509 Iron deficiency anemia, unspecified: Secondary | ICD-10-CM | POA: Diagnosis not present

## 2019-07-21 DIAGNOSIS — Z741 Need for assistance with personal care: Secondary | ICD-10-CM | POA: Diagnosis not present

## 2019-07-21 DIAGNOSIS — D631 Anemia in chronic kidney disease: Secondary | ICD-10-CM | POA: Diagnosis not present

## 2019-07-21 DIAGNOSIS — N186 End stage renal disease: Secondary | ICD-10-CM | POA: Diagnosis not present

## 2019-07-21 DIAGNOSIS — M2450 Contracture, unspecified joint: Secondary | ICD-10-CM | POA: Diagnosis not present

## 2019-07-21 DIAGNOSIS — Z992 Dependence on renal dialysis: Secondary | ICD-10-CM | POA: Diagnosis not present

## 2019-07-22 DIAGNOSIS — F200 Paranoid schizophrenia: Secondary | ICD-10-CM | POA: Diagnosis not present

## 2019-07-22 DIAGNOSIS — G894 Chronic pain syndrome: Secondary | ICD-10-CM | POA: Diagnosis not present

## 2019-07-22 DIAGNOSIS — Z741 Need for assistance with personal care: Secondary | ICD-10-CM | POA: Diagnosis not present

## 2019-07-22 DIAGNOSIS — F419 Anxiety disorder, unspecified: Secondary | ICD-10-CM | POA: Diagnosis not present

## 2019-07-22 DIAGNOSIS — M2450 Contracture, unspecified joint: Secondary | ICD-10-CM | POA: Diagnosis not present

## 2019-07-22 DIAGNOSIS — M6281 Muscle weakness (generalized): Secondary | ICD-10-CM | POA: Diagnosis not present

## 2019-07-23 DIAGNOSIS — F419 Anxiety disorder, unspecified: Secondary | ICD-10-CM | POA: Diagnosis not present

## 2019-07-23 DIAGNOSIS — N2581 Secondary hyperparathyroidism of renal origin: Secondary | ICD-10-CM | POA: Diagnosis not present

## 2019-07-23 DIAGNOSIS — D509 Iron deficiency anemia, unspecified: Secondary | ICD-10-CM | POA: Diagnosis not present

## 2019-07-23 DIAGNOSIS — M2450 Contracture, unspecified joint: Secondary | ICD-10-CM | POA: Diagnosis not present

## 2019-07-23 DIAGNOSIS — E785 Hyperlipidemia, unspecified: Secondary | ICD-10-CM | POA: Diagnosis not present

## 2019-07-23 DIAGNOSIS — N186 End stage renal disease: Secondary | ICD-10-CM | POA: Diagnosis not present

## 2019-07-23 DIAGNOSIS — E1129 Type 2 diabetes mellitus with other diabetic kidney complication: Secondary | ICD-10-CM | POA: Diagnosis not present

## 2019-07-23 DIAGNOSIS — I10 Essential (primary) hypertension: Secondary | ICD-10-CM | POA: Diagnosis not present

## 2019-07-23 DIAGNOSIS — Z741 Need for assistance with personal care: Secondary | ICD-10-CM | POA: Diagnosis not present

## 2019-07-23 DIAGNOSIS — G894 Chronic pain syndrome: Secondary | ICD-10-CM | POA: Diagnosis not present

## 2019-07-23 DIAGNOSIS — F200 Paranoid schizophrenia: Secondary | ICD-10-CM | POA: Diagnosis not present

## 2019-07-23 DIAGNOSIS — D631 Anemia in chronic kidney disease: Secondary | ICD-10-CM | POA: Diagnosis not present

## 2019-07-23 DIAGNOSIS — E1122 Type 2 diabetes mellitus with diabetic chronic kidney disease: Secondary | ICD-10-CM | POA: Diagnosis not present

## 2019-07-23 DIAGNOSIS — Z992 Dependence on renal dialysis: Secondary | ICD-10-CM | POA: Diagnosis not present

## 2019-07-23 DIAGNOSIS — M6281 Muscle weakness (generalized): Secondary | ICD-10-CM | POA: Diagnosis not present

## 2019-07-24 DIAGNOSIS — M2450 Contracture, unspecified joint: Secondary | ICD-10-CM | POA: Diagnosis not present

## 2019-07-24 DIAGNOSIS — M6281 Muscle weakness (generalized): Secondary | ICD-10-CM | POA: Diagnosis not present

## 2019-07-24 DIAGNOSIS — F419 Anxiety disorder, unspecified: Secondary | ICD-10-CM | POA: Diagnosis not present

## 2019-07-24 DIAGNOSIS — G894 Chronic pain syndrome: Secondary | ICD-10-CM | POA: Diagnosis not present

## 2019-07-24 DIAGNOSIS — Z741 Need for assistance with personal care: Secondary | ICD-10-CM | POA: Diagnosis not present

## 2019-07-24 DIAGNOSIS — F200 Paranoid schizophrenia: Secondary | ICD-10-CM | POA: Diagnosis not present

## 2019-07-25 DIAGNOSIS — F419 Anxiety disorder, unspecified: Secondary | ICD-10-CM | POA: Diagnosis not present

## 2019-07-25 DIAGNOSIS — M2450 Contracture, unspecified joint: Secondary | ICD-10-CM | POA: Diagnosis not present

## 2019-07-25 DIAGNOSIS — G894 Chronic pain syndrome: Secondary | ICD-10-CM | POA: Diagnosis not present

## 2019-07-25 DIAGNOSIS — N2581 Secondary hyperparathyroidism of renal origin: Secondary | ICD-10-CM | POA: Diagnosis not present

## 2019-07-25 DIAGNOSIS — N186 End stage renal disease: Secondary | ICD-10-CM | POA: Diagnosis not present

## 2019-07-25 DIAGNOSIS — D509 Iron deficiency anemia, unspecified: Secondary | ICD-10-CM | POA: Diagnosis not present

## 2019-07-25 DIAGNOSIS — D631 Anemia in chronic kidney disease: Secondary | ICD-10-CM | POA: Diagnosis not present

## 2019-07-25 DIAGNOSIS — Z992 Dependence on renal dialysis: Secondary | ICD-10-CM | POA: Diagnosis not present

## 2019-07-25 DIAGNOSIS — M6281 Muscle weakness (generalized): Secondary | ICD-10-CM | POA: Diagnosis not present

## 2019-07-25 DIAGNOSIS — F200 Paranoid schizophrenia: Secondary | ICD-10-CM | POA: Diagnosis not present

## 2019-07-25 DIAGNOSIS — Z741 Need for assistance with personal care: Secondary | ICD-10-CM | POA: Diagnosis not present

## 2019-07-25 DIAGNOSIS — E1129 Type 2 diabetes mellitus with other diabetic kidney complication: Secondary | ICD-10-CM | POA: Diagnosis not present

## 2019-07-26 DIAGNOSIS — E1129 Type 2 diabetes mellitus with other diabetic kidney complication: Secondary | ICD-10-CM | POA: Diagnosis not present

## 2019-07-26 DIAGNOSIS — Z992 Dependence on renal dialysis: Secondary | ICD-10-CM | POA: Diagnosis not present

## 2019-07-26 DIAGNOSIS — N186 End stage renal disease: Secondary | ICD-10-CM | POA: Diagnosis not present

## 2019-07-27 DIAGNOSIS — M6281 Muscle weakness (generalized): Secondary | ICD-10-CM | POA: Diagnosis not present

## 2019-07-27 DIAGNOSIS — Z89411 Acquired absence of right great toe: Secondary | ICD-10-CM | POA: Diagnosis not present

## 2019-07-27 DIAGNOSIS — F419 Anxiety disorder, unspecified: Secondary | ICD-10-CM | POA: Diagnosis not present

## 2019-07-27 DIAGNOSIS — G894 Chronic pain syndrome: Secondary | ICD-10-CM | POA: Diagnosis not present

## 2019-07-27 DIAGNOSIS — N186 End stage renal disease: Secondary | ICD-10-CM | POA: Diagnosis not present

## 2019-07-27 DIAGNOSIS — R2689 Other abnormalities of gait and mobility: Secondary | ICD-10-CM | POA: Diagnosis not present

## 2019-07-27 DIAGNOSIS — F200 Paranoid schizophrenia: Secondary | ICD-10-CM | POA: Diagnosis not present

## 2019-07-27 DIAGNOSIS — M2450 Contracture, unspecified joint: Secondary | ICD-10-CM | POA: Diagnosis not present

## 2019-07-27 DIAGNOSIS — Z741 Need for assistance with personal care: Secondary | ICD-10-CM | POA: Diagnosis not present

## 2019-07-28 DIAGNOSIS — Z992 Dependence on renal dialysis: Secondary | ICD-10-CM | POA: Diagnosis not present

## 2019-07-28 DIAGNOSIS — M2450 Contracture, unspecified joint: Secondary | ICD-10-CM | POA: Diagnosis not present

## 2019-07-28 DIAGNOSIS — Z741 Need for assistance with personal care: Secondary | ICD-10-CM | POA: Diagnosis not present

## 2019-07-28 DIAGNOSIS — F419 Anxiety disorder, unspecified: Secondary | ICD-10-CM | POA: Diagnosis not present

## 2019-07-28 DIAGNOSIS — N186 End stage renal disease: Secondary | ICD-10-CM | POA: Diagnosis not present

## 2019-07-28 DIAGNOSIS — F200 Paranoid schizophrenia: Secondary | ICD-10-CM | POA: Diagnosis not present

## 2019-07-28 DIAGNOSIS — D509 Iron deficiency anemia, unspecified: Secondary | ICD-10-CM | POA: Diagnosis not present

## 2019-07-28 DIAGNOSIS — D631 Anemia in chronic kidney disease: Secondary | ICD-10-CM | POA: Diagnosis not present

## 2019-07-28 DIAGNOSIS — M6281 Muscle weakness (generalized): Secondary | ICD-10-CM | POA: Diagnosis not present

## 2019-07-28 DIAGNOSIS — G894 Chronic pain syndrome: Secondary | ICD-10-CM | POA: Diagnosis not present

## 2019-07-28 DIAGNOSIS — N2581 Secondary hyperparathyroidism of renal origin: Secondary | ICD-10-CM | POA: Diagnosis not present

## 2019-07-29 DIAGNOSIS — M2450 Contracture, unspecified joint: Secondary | ICD-10-CM | POA: Diagnosis not present

## 2019-07-29 DIAGNOSIS — G894 Chronic pain syndrome: Secondary | ICD-10-CM | POA: Diagnosis not present

## 2019-07-29 DIAGNOSIS — M6281 Muscle weakness (generalized): Secondary | ICD-10-CM | POA: Diagnosis not present

## 2019-07-29 DIAGNOSIS — Z741 Need for assistance with personal care: Secondary | ICD-10-CM | POA: Diagnosis not present

## 2019-07-29 DIAGNOSIS — F200 Paranoid schizophrenia: Secondary | ICD-10-CM | POA: Diagnosis not present

## 2019-07-29 DIAGNOSIS — F419 Anxiety disorder, unspecified: Secondary | ICD-10-CM | POA: Diagnosis not present

## 2019-07-30 DIAGNOSIS — D631 Anemia in chronic kidney disease: Secondary | ICD-10-CM | POA: Diagnosis not present

## 2019-07-30 DIAGNOSIS — M2450 Contracture, unspecified joint: Secondary | ICD-10-CM | POA: Diagnosis not present

## 2019-07-30 DIAGNOSIS — Z992 Dependence on renal dialysis: Secondary | ICD-10-CM | POA: Diagnosis not present

## 2019-07-30 DIAGNOSIS — D509 Iron deficiency anemia, unspecified: Secondary | ICD-10-CM | POA: Diagnosis not present

## 2019-07-30 DIAGNOSIS — N186 End stage renal disease: Secondary | ICD-10-CM | POA: Diagnosis not present

## 2019-07-30 DIAGNOSIS — Z741 Need for assistance with personal care: Secondary | ICD-10-CM | POA: Diagnosis not present

## 2019-07-30 DIAGNOSIS — N2581 Secondary hyperparathyroidism of renal origin: Secondary | ICD-10-CM | POA: Diagnosis not present

## 2019-07-30 DIAGNOSIS — M6281 Muscle weakness (generalized): Secondary | ICD-10-CM | POA: Diagnosis not present

## 2019-07-30 DIAGNOSIS — G894 Chronic pain syndrome: Secondary | ICD-10-CM | POA: Diagnosis not present

## 2019-07-30 DIAGNOSIS — F419 Anxiety disorder, unspecified: Secondary | ICD-10-CM | POA: Diagnosis not present

## 2019-07-30 DIAGNOSIS — F200 Paranoid schizophrenia: Secondary | ICD-10-CM | POA: Diagnosis not present

## 2019-07-31 DIAGNOSIS — E559 Vitamin D deficiency, unspecified: Secondary | ICD-10-CM | POA: Diagnosis not present

## 2019-07-31 DIAGNOSIS — M6281 Muscle weakness (generalized): Secondary | ICD-10-CM | POA: Diagnosis not present

## 2019-07-31 DIAGNOSIS — G894 Chronic pain syndrome: Secondary | ICD-10-CM | POA: Diagnosis not present

## 2019-07-31 DIAGNOSIS — N186 End stage renal disease: Secondary | ICD-10-CM | POA: Diagnosis not present

## 2019-07-31 DIAGNOSIS — Z741 Need for assistance with personal care: Secondary | ICD-10-CM | POA: Diagnosis not present

## 2019-07-31 DIAGNOSIS — I10 Essential (primary) hypertension: Secondary | ICD-10-CM | POA: Diagnosis not present

## 2019-07-31 DIAGNOSIS — M2450 Contracture, unspecified joint: Secondary | ICD-10-CM | POA: Diagnosis not present

## 2019-07-31 DIAGNOSIS — E785 Hyperlipidemia, unspecified: Secondary | ICD-10-CM | POA: Diagnosis not present

## 2019-07-31 DIAGNOSIS — F419 Anxiety disorder, unspecified: Secondary | ICD-10-CM | POA: Diagnosis not present

## 2019-07-31 DIAGNOSIS — F200 Paranoid schizophrenia: Secondary | ICD-10-CM | POA: Diagnosis not present

## 2019-08-01 DIAGNOSIS — Z741 Need for assistance with personal care: Secondary | ICD-10-CM | POA: Diagnosis not present

## 2019-08-01 DIAGNOSIS — F419 Anxiety disorder, unspecified: Secondary | ICD-10-CM | POA: Diagnosis not present

## 2019-08-01 DIAGNOSIS — N186 End stage renal disease: Secondary | ICD-10-CM | POA: Diagnosis not present

## 2019-08-01 DIAGNOSIS — D631 Anemia in chronic kidney disease: Secondary | ICD-10-CM | POA: Diagnosis not present

## 2019-08-01 DIAGNOSIS — M2450 Contracture, unspecified joint: Secondary | ICD-10-CM | POA: Diagnosis not present

## 2019-08-01 DIAGNOSIS — Z992 Dependence on renal dialysis: Secondary | ICD-10-CM | POA: Diagnosis not present

## 2019-08-01 DIAGNOSIS — G894 Chronic pain syndrome: Secondary | ICD-10-CM | POA: Diagnosis not present

## 2019-08-01 DIAGNOSIS — F200 Paranoid schizophrenia: Secondary | ICD-10-CM | POA: Diagnosis not present

## 2019-08-01 DIAGNOSIS — D509 Iron deficiency anemia, unspecified: Secondary | ICD-10-CM | POA: Diagnosis not present

## 2019-08-01 DIAGNOSIS — M6281 Muscle weakness (generalized): Secondary | ICD-10-CM | POA: Diagnosis not present

## 2019-08-01 DIAGNOSIS — N2581 Secondary hyperparathyroidism of renal origin: Secondary | ICD-10-CM | POA: Diagnosis not present

## 2019-08-04 DIAGNOSIS — D509 Iron deficiency anemia, unspecified: Secondary | ICD-10-CM | POA: Diagnosis not present

## 2019-08-04 DIAGNOSIS — F200 Paranoid schizophrenia: Secondary | ICD-10-CM | POA: Diagnosis not present

## 2019-08-04 DIAGNOSIS — G894 Chronic pain syndrome: Secondary | ICD-10-CM | POA: Diagnosis not present

## 2019-08-04 DIAGNOSIS — F419 Anxiety disorder, unspecified: Secondary | ICD-10-CM | POA: Diagnosis not present

## 2019-08-04 DIAGNOSIS — M6281 Muscle weakness (generalized): Secondary | ICD-10-CM | POA: Diagnosis not present

## 2019-08-04 DIAGNOSIS — Z741 Need for assistance with personal care: Secondary | ICD-10-CM | POA: Diagnosis not present

## 2019-08-04 DIAGNOSIS — D631 Anemia in chronic kidney disease: Secondary | ICD-10-CM | POA: Diagnosis not present

## 2019-08-04 DIAGNOSIS — N2581 Secondary hyperparathyroidism of renal origin: Secondary | ICD-10-CM | POA: Diagnosis not present

## 2019-08-04 DIAGNOSIS — Z992 Dependence on renal dialysis: Secondary | ICD-10-CM | POA: Diagnosis not present

## 2019-08-04 DIAGNOSIS — M2450 Contracture, unspecified joint: Secondary | ICD-10-CM | POA: Diagnosis not present

## 2019-08-04 DIAGNOSIS — N186 End stage renal disease: Secondary | ICD-10-CM | POA: Diagnosis not present

## 2019-08-05 DIAGNOSIS — Z741 Need for assistance with personal care: Secondary | ICD-10-CM | POA: Diagnosis not present

## 2019-08-05 DIAGNOSIS — G894 Chronic pain syndrome: Secondary | ICD-10-CM | POA: Diagnosis not present

## 2019-08-05 DIAGNOSIS — M2450 Contracture, unspecified joint: Secondary | ICD-10-CM | POA: Diagnosis not present

## 2019-08-05 DIAGNOSIS — M6281 Muscle weakness (generalized): Secondary | ICD-10-CM | POA: Diagnosis not present

## 2019-08-05 DIAGNOSIS — F200 Paranoid schizophrenia: Secondary | ICD-10-CM | POA: Diagnosis not present

## 2019-08-05 DIAGNOSIS — F419 Anxiety disorder, unspecified: Secondary | ICD-10-CM | POA: Diagnosis not present

## 2019-08-06 DIAGNOSIS — N2581 Secondary hyperparathyroidism of renal origin: Secondary | ICD-10-CM | POA: Diagnosis not present

## 2019-08-06 DIAGNOSIS — N186 End stage renal disease: Secondary | ICD-10-CM | POA: Diagnosis not present

## 2019-08-06 DIAGNOSIS — G894 Chronic pain syndrome: Secondary | ICD-10-CM | POA: Diagnosis not present

## 2019-08-06 DIAGNOSIS — M6281 Muscle weakness (generalized): Secondary | ICD-10-CM | POA: Diagnosis not present

## 2019-08-06 DIAGNOSIS — Z741 Need for assistance with personal care: Secondary | ICD-10-CM | POA: Diagnosis not present

## 2019-08-06 DIAGNOSIS — D509 Iron deficiency anemia, unspecified: Secondary | ICD-10-CM | POA: Diagnosis not present

## 2019-08-06 DIAGNOSIS — F200 Paranoid schizophrenia: Secondary | ICD-10-CM | POA: Diagnosis not present

## 2019-08-06 DIAGNOSIS — Z992 Dependence on renal dialysis: Secondary | ICD-10-CM | POA: Diagnosis not present

## 2019-08-06 DIAGNOSIS — F419 Anxiety disorder, unspecified: Secondary | ICD-10-CM | POA: Diagnosis not present

## 2019-08-06 DIAGNOSIS — D631 Anemia in chronic kidney disease: Secondary | ICD-10-CM | POA: Diagnosis not present

## 2019-08-06 DIAGNOSIS — M2450 Contracture, unspecified joint: Secondary | ICD-10-CM | POA: Diagnosis not present

## 2019-08-07 DIAGNOSIS — G894 Chronic pain syndrome: Secondary | ICD-10-CM | POA: Diagnosis not present

## 2019-08-07 DIAGNOSIS — M2450 Contracture, unspecified joint: Secondary | ICD-10-CM | POA: Diagnosis not present

## 2019-08-07 DIAGNOSIS — M6281 Muscle weakness (generalized): Secondary | ICD-10-CM | POA: Diagnosis not present

## 2019-08-07 DIAGNOSIS — F419 Anxiety disorder, unspecified: Secondary | ICD-10-CM | POA: Diagnosis not present

## 2019-08-07 DIAGNOSIS — Z741 Need for assistance with personal care: Secondary | ICD-10-CM | POA: Diagnosis not present

## 2019-08-07 DIAGNOSIS — F200 Paranoid schizophrenia: Secondary | ICD-10-CM | POA: Diagnosis not present

## 2019-08-08 DIAGNOSIS — D509 Iron deficiency anemia, unspecified: Secondary | ICD-10-CM | POA: Diagnosis not present

## 2019-08-08 DIAGNOSIS — E1122 Type 2 diabetes mellitus with diabetic chronic kidney disease: Secondary | ICD-10-CM | POA: Diagnosis not present

## 2019-08-08 DIAGNOSIS — M2450 Contracture, unspecified joint: Secondary | ICD-10-CM | POA: Diagnosis not present

## 2019-08-08 DIAGNOSIS — G894 Chronic pain syndrome: Secondary | ICD-10-CM | POA: Diagnosis not present

## 2019-08-08 DIAGNOSIS — F200 Paranoid schizophrenia: Secondary | ICD-10-CM | POA: Diagnosis not present

## 2019-08-08 DIAGNOSIS — F419 Anxiety disorder, unspecified: Secondary | ICD-10-CM | POA: Diagnosis not present

## 2019-08-08 DIAGNOSIS — Z992 Dependence on renal dialysis: Secondary | ICD-10-CM | POA: Diagnosis not present

## 2019-08-08 DIAGNOSIS — N2581 Secondary hyperparathyroidism of renal origin: Secondary | ICD-10-CM | POA: Diagnosis not present

## 2019-08-08 DIAGNOSIS — Z741 Need for assistance with personal care: Secondary | ICD-10-CM | POA: Diagnosis not present

## 2019-08-08 DIAGNOSIS — N186 End stage renal disease: Secondary | ICD-10-CM | POA: Diagnosis not present

## 2019-08-08 DIAGNOSIS — I10 Essential (primary) hypertension: Secondary | ICD-10-CM | POA: Diagnosis not present

## 2019-08-08 DIAGNOSIS — E785 Hyperlipidemia, unspecified: Secondary | ICD-10-CM | POA: Diagnosis not present

## 2019-08-08 DIAGNOSIS — M6281 Muscle weakness (generalized): Secondary | ICD-10-CM | POA: Diagnosis not present

## 2019-08-08 DIAGNOSIS — D631 Anemia in chronic kidney disease: Secondary | ICD-10-CM | POA: Diagnosis not present

## 2019-08-09 DIAGNOSIS — M6281 Muscle weakness (generalized): Secondary | ICD-10-CM | POA: Diagnosis not present

## 2019-08-09 DIAGNOSIS — Z741 Need for assistance with personal care: Secondary | ICD-10-CM | POA: Diagnosis not present

## 2019-08-09 DIAGNOSIS — F200 Paranoid schizophrenia: Secondary | ICD-10-CM | POA: Diagnosis not present

## 2019-08-09 DIAGNOSIS — F419 Anxiety disorder, unspecified: Secondary | ICD-10-CM | POA: Diagnosis not present

## 2019-08-09 DIAGNOSIS — M2450 Contracture, unspecified joint: Secondary | ICD-10-CM | POA: Diagnosis not present

## 2019-08-09 DIAGNOSIS — G894 Chronic pain syndrome: Secondary | ICD-10-CM | POA: Diagnosis not present

## 2019-08-10 DIAGNOSIS — F419 Anxiety disorder, unspecified: Secondary | ICD-10-CM | POA: Diagnosis not present

## 2019-08-10 DIAGNOSIS — M2450 Contracture, unspecified joint: Secondary | ICD-10-CM | POA: Diagnosis not present

## 2019-08-10 DIAGNOSIS — M6281 Muscle weakness (generalized): Secondary | ICD-10-CM | POA: Diagnosis not present

## 2019-08-10 DIAGNOSIS — Z741 Need for assistance with personal care: Secondary | ICD-10-CM | POA: Diagnosis not present

## 2019-08-10 DIAGNOSIS — F200 Paranoid schizophrenia: Secondary | ICD-10-CM | POA: Diagnosis not present

## 2019-08-10 DIAGNOSIS — G894 Chronic pain syndrome: Secondary | ICD-10-CM | POA: Diagnosis not present

## 2019-08-11 DIAGNOSIS — D509 Iron deficiency anemia, unspecified: Secondary | ICD-10-CM | POA: Diagnosis not present

## 2019-08-11 DIAGNOSIS — M2450 Contracture, unspecified joint: Secondary | ICD-10-CM | POA: Diagnosis not present

## 2019-08-11 DIAGNOSIS — N186 End stage renal disease: Secondary | ICD-10-CM | POA: Diagnosis not present

## 2019-08-11 DIAGNOSIS — Z992 Dependence on renal dialysis: Secondary | ICD-10-CM | POA: Diagnosis not present

## 2019-08-11 DIAGNOSIS — M6281 Muscle weakness (generalized): Secondary | ICD-10-CM | POA: Diagnosis not present

## 2019-08-11 DIAGNOSIS — N2581 Secondary hyperparathyroidism of renal origin: Secondary | ICD-10-CM | POA: Diagnosis not present

## 2019-08-11 DIAGNOSIS — Z741 Need for assistance with personal care: Secondary | ICD-10-CM | POA: Diagnosis not present

## 2019-08-11 DIAGNOSIS — D631 Anemia in chronic kidney disease: Secondary | ICD-10-CM | POA: Diagnosis not present

## 2019-08-11 DIAGNOSIS — F200 Paranoid schizophrenia: Secondary | ICD-10-CM | POA: Diagnosis not present

## 2019-08-11 DIAGNOSIS — G894 Chronic pain syndrome: Secondary | ICD-10-CM | POA: Diagnosis not present

## 2019-08-11 DIAGNOSIS — F419 Anxiety disorder, unspecified: Secondary | ICD-10-CM | POA: Diagnosis not present

## 2019-08-12 DIAGNOSIS — M6281 Muscle weakness (generalized): Secondary | ICD-10-CM | POA: Diagnosis not present

## 2019-08-12 DIAGNOSIS — F200 Paranoid schizophrenia: Secondary | ICD-10-CM | POA: Diagnosis not present

## 2019-08-12 DIAGNOSIS — Z741 Need for assistance with personal care: Secondary | ICD-10-CM | POA: Diagnosis not present

## 2019-08-12 DIAGNOSIS — M2450 Contracture, unspecified joint: Secondary | ICD-10-CM | POA: Diagnosis not present

## 2019-08-12 DIAGNOSIS — G894 Chronic pain syndrome: Secondary | ICD-10-CM | POA: Diagnosis not present

## 2019-08-12 DIAGNOSIS — F419 Anxiety disorder, unspecified: Secondary | ICD-10-CM | POA: Diagnosis not present

## 2019-08-13 DIAGNOSIS — N186 End stage renal disease: Secondary | ICD-10-CM | POA: Diagnosis not present

## 2019-08-13 DIAGNOSIS — F200 Paranoid schizophrenia: Secondary | ICD-10-CM | POA: Diagnosis not present

## 2019-08-13 DIAGNOSIS — G894 Chronic pain syndrome: Secondary | ICD-10-CM | POA: Diagnosis not present

## 2019-08-13 DIAGNOSIS — D631 Anemia in chronic kidney disease: Secondary | ICD-10-CM | POA: Diagnosis not present

## 2019-08-13 DIAGNOSIS — M2450 Contracture, unspecified joint: Secondary | ICD-10-CM | POA: Diagnosis not present

## 2019-08-13 DIAGNOSIS — Z992 Dependence on renal dialysis: Secondary | ICD-10-CM | POA: Diagnosis not present

## 2019-08-13 DIAGNOSIS — F419 Anxiety disorder, unspecified: Secondary | ICD-10-CM | POA: Diagnosis not present

## 2019-08-13 DIAGNOSIS — D509 Iron deficiency anemia, unspecified: Secondary | ICD-10-CM | POA: Diagnosis not present

## 2019-08-13 DIAGNOSIS — M6281 Muscle weakness (generalized): Secondary | ICD-10-CM | POA: Diagnosis not present

## 2019-08-13 DIAGNOSIS — Z741 Need for assistance with personal care: Secondary | ICD-10-CM | POA: Diagnosis not present

## 2019-08-13 DIAGNOSIS — N2581 Secondary hyperparathyroidism of renal origin: Secondary | ICD-10-CM | POA: Diagnosis not present

## 2019-08-14 DIAGNOSIS — Z741 Need for assistance with personal care: Secondary | ICD-10-CM | POA: Diagnosis not present

## 2019-08-14 DIAGNOSIS — M6281 Muscle weakness (generalized): Secondary | ICD-10-CM | POA: Diagnosis not present

## 2019-08-14 DIAGNOSIS — M2450 Contracture, unspecified joint: Secondary | ICD-10-CM | POA: Diagnosis not present

## 2019-08-14 DIAGNOSIS — G894 Chronic pain syndrome: Secondary | ICD-10-CM | POA: Diagnosis not present

## 2019-08-14 DIAGNOSIS — F200 Paranoid schizophrenia: Secondary | ICD-10-CM | POA: Diagnosis not present

## 2019-08-14 DIAGNOSIS — F419 Anxiety disorder, unspecified: Secondary | ICD-10-CM | POA: Diagnosis not present

## 2019-08-15 DIAGNOSIS — Z992 Dependence on renal dialysis: Secondary | ICD-10-CM | POA: Diagnosis not present

## 2019-08-15 DIAGNOSIS — N2581 Secondary hyperparathyroidism of renal origin: Secondary | ICD-10-CM | POA: Diagnosis not present

## 2019-08-15 DIAGNOSIS — N186 End stage renal disease: Secondary | ICD-10-CM | POA: Diagnosis not present

## 2019-08-15 DIAGNOSIS — D509 Iron deficiency anemia, unspecified: Secondary | ICD-10-CM | POA: Diagnosis not present

## 2019-08-15 DIAGNOSIS — D631 Anemia in chronic kidney disease: Secondary | ICD-10-CM | POA: Diagnosis not present

## 2019-08-16 DIAGNOSIS — F419 Anxiety disorder, unspecified: Secondary | ICD-10-CM | POA: Diagnosis not present

## 2019-08-16 DIAGNOSIS — I10 Essential (primary) hypertension: Secondary | ICD-10-CM | POA: Diagnosis not present

## 2019-08-16 DIAGNOSIS — E559 Vitamin D deficiency, unspecified: Secondary | ICD-10-CM | POA: Diagnosis not present

## 2019-08-16 DIAGNOSIS — E785 Hyperlipidemia, unspecified: Secondary | ICD-10-CM | POA: Diagnosis not present

## 2019-08-16 DIAGNOSIS — M2450 Contracture, unspecified joint: Secondary | ICD-10-CM | POA: Diagnosis not present

## 2019-08-16 DIAGNOSIS — M6281 Muscle weakness (generalized): Secondary | ICD-10-CM | POA: Diagnosis not present

## 2019-08-16 DIAGNOSIS — Z741 Need for assistance with personal care: Secondary | ICD-10-CM | POA: Diagnosis not present

## 2019-08-16 DIAGNOSIS — E1122 Type 2 diabetes mellitus with diabetic chronic kidney disease: Secondary | ICD-10-CM | POA: Diagnosis not present

## 2019-08-16 DIAGNOSIS — D649 Anemia, unspecified: Secondary | ICD-10-CM | POA: Diagnosis not present

## 2019-08-16 DIAGNOSIS — G894 Chronic pain syndrome: Secondary | ICD-10-CM | POA: Diagnosis not present

## 2019-08-16 DIAGNOSIS — E039 Hypothyroidism, unspecified: Secondary | ICD-10-CM | POA: Diagnosis not present

## 2019-08-16 DIAGNOSIS — F200 Paranoid schizophrenia: Secondary | ICD-10-CM | POA: Diagnosis not present

## 2019-08-17 DIAGNOSIS — G894 Chronic pain syndrome: Secondary | ICD-10-CM | POA: Diagnosis not present

## 2019-08-17 DIAGNOSIS — M2450 Contracture, unspecified joint: Secondary | ICD-10-CM | POA: Diagnosis not present

## 2019-08-17 DIAGNOSIS — F200 Paranoid schizophrenia: Secondary | ICD-10-CM | POA: Diagnosis not present

## 2019-08-17 DIAGNOSIS — Z741 Need for assistance with personal care: Secondary | ICD-10-CM | POA: Diagnosis not present

## 2019-08-17 DIAGNOSIS — M6281 Muscle weakness (generalized): Secondary | ICD-10-CM | POA: Diagnosis not present

## 2019-08-17 DIAGNOSIS — F419 Anxiety disorder, unspecified: Secondary | ICD-10-CM | POA: Diagnosis not present

## 2019-08-18 DIAGNOSIS — Z992 Dependence on renal dialysis: Secondary | ICD-10-CM | POA: Diagnosis not present

## 2019-08-18 DIAGNOSIS — M6281 Muscle weakness (generalized): Secondary | ICD-10-CM | POA: Diagnosis not present

## 2019-08-18 DIAGNOSIS — M2450 Contracture, unspecified joint: Secondary | ICD-10-CM | POA: Diagnosis not present

## 2019-08-18 DIAGNOSIS — G894 Chronic pain syndrome: Secondary | ICD-10-CM | POA: Diagnosis not present

## 2019-08-18 DIAGNOSIS — Z741 Need for assistance with personal care: Secondary | ICD-10-CM | POA: Diagnosis not present

## 2019-08-18 DIAGNOSIS — N2581 Secondary hyperparathyroidism of renal origin: Secondary | ICD-10-CM | POA: Diagnosis not present

## 2019-08-18 DIAGNOSIS — D509 Iron deficiency anemia, unspecified: Secondary | ICD-10-CM | POA: Diagnosis not present

## 2019-08-18 DIAGNOSIS — F419 Anxiety disorder, unspecified: Secondary | ICD-10-CM | POA: Diagnosis not present

## 2019-08-18 DIAGNOSIS — D631 Anemia in chronic kidney disease: Secondary | ICD-10-CM | POA: Diagnosis not present

## 2019-08-18 DIAGNOSIS — F200 Paranoid schizophrenia: Secondary | ICD-10-CM | POA: Diagnosis not present

## 2019-08-18 DIAGNOSIS — N186 End stage renal disease: Secondary | ICD-10-CM | POA: Diagnosis not present

## 2019-08-19 DIAGNOSIS — Z741 Need for assistance with personal care: Secondary | ICD-10-CM | POA: Diagnosis not present

## 2019-08-19 DIAGNOSIS — G894 Chronic pain syndrome: Secondary | ICD-10-CM | POA: Diagnosis not present

## 2019-08-19 DIAGNOSIS — M6281 Muscle weakness (generalized): Secondary | ICD-10-CM | POA: Diagnosis not present

## 2019-08-19 DIAGNOSIS — M2450 Contracture, unspecified joint: Secondary | ICD-10-CM | POA: Diagnosis not present

## 2019-08-19 DIAGNOSIS — F419 Anxiety disorder, unspecified: Secondary | ICD-10-CM | POA: Diagnosis not present

## 2019-08-19 DIAGNOSIS — F200 Paranoid schizophrenia: Secondary | ICD-10-CM | POA: Diagnosis not present

## 2019-08-20 DIAGNOSIS — M6281 Muscle weakness (generalized): Secondary | ICD-10-CM | POA: Diagnosis not present

## 2019-08-20 DIAGNOSIS — N2581 Secondary hyperparathyroidism of renal origin: Secondary | ICD-10-CM | POA: Diagnosis not present

## 2019-08-20 DIAGNOSIS — Z741 Need for assistance with personal care: Secondary | ICD-10-CM | POA: Diagnosis not present

## 2019-08-20 DIAGNOSIS — Z992 Dependence on renal dialysis: Secondary | ICD-10-CM | POA: Diagnosis not present

## 2019-08-20 DIAGNOSIS — F419 Anxiety disorder, unspecified: Secondary | ICD-10-CM | POA: Diagnosis not present

## 2019-08-20 DIAGNOSIS — N186 End stage renal disease: Secondary | ICD-10-CM | POA: Diagnosis not present

## 2019-08-20 DIAGNOSIS — F200 Paranoid schizophrenia: Secondary | ICD-10-CM | POA: Diagnosis not present

## 2019-08-20 DIAGNOSIS — G894 Chronic pain syndrome: Secondary | ICD-10-CM | POA: Diagnosis not present

## 2019-08-20 DIAGNOSIS — D631 Anemia in chronic kidney disease: Secondary | ICD-10-CM | POA: Diagnosis not present

## 2019-08-20 DIAGNOSIS — M2450 Contracture, unspecified joint: Secondary | ICD-10-CM | POA: Diagnosis not present

## 2019-08-20 DIAGNOSIS — D509 Iron deficiency anemia, unspecified: Secondary | ICD-10-CM | POA: Diagnosis not present

## 2019-08-21 DIAGNOSIS — I10 Essential (primary) hypertension: Secondary | ICD-10-CM | POA: Diagnosis not present

## 2019-08-21 DIAGNOSIS — G894 Chronic pain syndrome: Secondary | ICD-10-CM | POA: Diagnosis not present

## 2019-08-21 DIAGNOSIS — F200 Paranoid schizophrenia: Secondary | ICD-10-CM | POA: Diagnosis not present

## 2019-08-21 DIAGNOSIS — M6281 Muscle weakness (generalized): Secondary | ICD-10-CM | POA: Diagnosis not present

## 2019-08-21 DIAGNOSIS — E785 Hyperlipidemia, unspecified: Secondary | ICD-10-CM | POA: Diagnosis not present

## 2019-08-21 DIAGNOSIS — Z741 Need for assistance with personal care: Secondary | ICD-10-CM | POA: Diagnosis not present

## 2019-08-21 DIAGNOSIS — E1122 Type 2 diabetes mellitus with diabetic chronic kidney disease: Secondary | ICD-10-CM | POA: Diagnosis not present

## 2019-08-21 DIAGNOSIS — F419 Anxiety disorder, unspecified: Secondary | ICD-10-CM | POA: Diagnosis not present

## 2019-08-21 DIAGNOSIS — M2450 Contracture, unspecified joint: Secondary | ICD-10-CM | POA: Diagnosis not present

## 2019-08-22 DIAGNOSIS — N186 End stage renal disease: Secondary | ICD-10-CM | POA: Diagnosis not present

## 2019-08-22 DIAGNOSIS — D509 Iron deficiency anemia, unspecified: Secondary | ICD-10-CM | POA: Diagnosis not present

## 2019-08-22 DIAGNOSIS — Z992 Dependence on renal dialysis: Secondary | ICD-10-CM | POA: Diagnosis not present

## 2019-08-22 DIAGNOSIS — D631 Anemia in chronic kidney disease: Secondary | ICD-10-CM | POA: Diagnosis not present

## 2019-08-22 DIAGNOSIS — N2581 Secondary hyperparathyroidism of renal origin: Secondary | ICD-10-CM | POA: Diagnosis not present

## 2019-08-24 DIAGNOSIS — G894 Chronic pain syndrome: Secondary | ICD-10-CM | POA: Diagnosis not present

## 2019-08-24 DIAGNOSIS — M2450 Contracture, unspecified joint: Secondary | ICD-10-CM | POA: Diagnosis not present

## 2019-08-24 DIAGNOSIS — M6281 Muscle weakness (generalized): Secondary | ICD-10-CM | POA: Diagnosis not present

## 2019-08-24 DIAGNOSIS — F419 Anxiety disorder, unspecified: Secondary | ICD-10-CM | POA: Diagnosis not present

## 2019-08-24 DIAGNOSIS — Z741 Need for assistance with personal care: Secondary | ICD-10-CM | POA: Diagnosis not present

## 2019-08-24 DIAGNOSIS — F200 Paranoid schizophrenia: Secondary | ICD-10-CM | POA: Diagnosis not present

## 2019-08-25 DIAGNOSIS — M6281 Muscle weakness (generalized): Secondary | ICD-10-CM | POA: Diagnosis not present

## 2019-08-25 DIAGNOSIS — N186 End stage renal disease: Secondary | ICD-10-CM | POA: Diagnosis not present

## 2019-08-25 DIAGNOSIS — F200 Paranoid schizophrenia: Secondary | ICD-10-CM | POA: Diagnosis not present

## 2019-08-25 DIAGNOSIS — N2581 Secondary hyperparathyroidism of renal origin: Secondary | ICD-10-CM | POA: Diagnosis not present

## 2019-08-25 DIAGNOSIS — F419 Anxiety disorder, unspecified: Secondary | ICD-10-CM | POA: Diagnosis not present

## 2019-08-25 DIAGNOSIS — D509 Iron deficiency anemia, unspecified: Secondary | ICD-10-CM | POA: Diagnosis not present

## 2019-08-25 DIAGNOSIS — Z992 Dependence on renal dialysis: Secondary | ICD-10-CM | POA: Diagnosis not present

## 2019-08-25 DIAGNOSIS — G894 Chronic pain syndrome: Secondary | ICD-10-CM | POA: Diagnosis not present

## 2019-08-25 DIAGNOSIS — M2450 Contracture, unspecified joint: Secondary | ICD-10-CM | POA: Diagnosis not present

## 2019-08-25 DIAGNOSIS — D631 Anemia in chronic kidney disease: Secondary | ICD-10-CM | POA: Diagnosis not present

## 2019-08-25 DIAGNOSIS — Z741 Need for assistance with personal care: Secondary | ICD-10-CM | POA: Diagnosis not present

## 2019-08-26 DIAGNOSIS — F200 Paranoid schizophrenia: Secondary | ICD-10-CM | POA: Diagnosis not present

## 2019-08-26 DIAGNOSIS — R2689 Other abnormalities of gait and mobility: Secondary | ICD-10-CM | POA: Diagnosis not present

## 2019-08-26 DIAGNOSIS — F419 Anxiety disorder, unspecified: Secondary | ICD-10-CM | POA: Diagnosis not present

## 2019-08-26 DIAGNOSIS — G894 Chronic pain syndrome: Secondary | ICD-10-CM | POA: Diagnosis not present

## 2019-08-26 DIAGNOSIS — N186 End stage renal disease: Secondary | ICD-10-CM | POA: Diagnosis not present

## 2019-08-26 DIAGNOSIS — Z741 Need for assistance with personal care: Secondary | ICD-10-CM | POA: Diagnosis not present

## 2019-08-26 DIAGNOSIS — M2450 Contracture, unspecified joint: Secondary | ICD-10-CM | POA: Diagnosis not present

## 2019-08-26 DIAGNOSIS — E1129 Type 2 diabetes mellitus with other diabetic kidney complication: Secondary | ICD-10-CM | POA: Diagnosis not present

## 2019-08-26 DIAGNOSIS — M6281 Muscle weakness (generalized): Secondary | ICD-10-CM | POA: Diagnosis not present

## 2019-08-26 DIAGNOSIS — Z992 Dependence on renal dialysis: Secondary | ICD-10-CM | POA: Diagnosis not present

## 2019-08-26 DIAGNOSIS — Z89411 Acquired absence of right great toe: Secondary | ICD-10-CM | POA: Diagnosis not present

## 2019-08-27 DIAGNOSIS — M2450 Contracture, unspecified joint: Secondary | ICD-10-CM | POA: Diagnosis not present

## 2019-08-27 DIAGNOSIS — Z741 Need for assistance with personal care: Secondary | ICD-10-CM | POA: Diagnosis not present

## 2019-08-27 DIAGNOSIS — Z992 Dependence on renal dialysis: Secondary | ICD-10-CM | POA: Diagnosis not present

## 2019-08-27 DIAGNOSIS — G894 Chronic pain syndrome: Secondary | ICD-10-CM | POA: Diagnosis not present

## 2019-08-27 DIAGNOSIS — N186 End stage renal disease: Secondary | ICD-10-CM | POA: Diagnosis not present

## 2019-08-27 DIAGNOSIS — Z23 Encounter for immunization: Secondary | ICD-10-CM | POA: Diagnosis not present

## 2019-08-27 DIAGNOSIS — F419 Anxiety disorder, unspecified: Secondary | ICD-10-CM | POA: Diagnosis not present

## 2019-08-27 DIAGNOSIS — D631 Anemia in chronic kidney disease: Secondary | ICD-10-CM | POA: Diagnosis not present

## 2019-08-27 DIAGNOSIS — M6281 Muscle weakness (generalized): Secondary | ICD-10-CM | POA: Diagnosis not present

## 2019-08-27 DIAGNOSIS — E1129 Type 2 diabetes mellitus with other diabetic kidney complication: Secondary | ICD-10-CM | POA: Diagnosis not present

## 2019-08-27 DIAGNOSIS — N2581 Secondary hyperparathyroidism of renal origin: Secondary | ICD-10-CM | POA: Diagnosis not present

## 2019-08-27 DIAGNOSIS — F200 Paranoid schizophrenia: Secondary | ICD-10-CM | POA: Diagnosis not present

## 2019-08-27 DIAGNOSIS — D509 Iron deficiency anemia, unspecified: Secondary | ICD-10-CM | POA: Diagnosis not present

## 2019-08-28 DIAGNOSIS — M2450 Contracture, unspecified joint: Secondary | ICD-10-CM | POA: Diagnosis not present

## 2019-08-28 DIAGNOSIS — F200 Paranoid schizophrenia: Secondary | ICD-10-CM | POA: Diagnosis not present

## 2019-08-28 DIAGNOSIS — G894 Chronic pain syndrome: Secondary | ICD-10-CM | POA: Diagnosis not present

## 2019-08-28 DIAGNOSIS — Z741 Need for assistance with personal care: Secondary | ICD-10-CM | POA: Diagnosis not present

## 2019-08-28 DIAGNOSIS — F419 Anxiety disorder, unspecified: Secondary | ICD-10-CM | POA: Diagnosis not present

## 2019-08-28 DIAGNOSIS — M6281 Muscle weakness (generalized): Secondary | ICD-10-CM | POA: Diagnosis not present

## 2019-08-29 DIAGNOSIS — N186 End stage renal disease: Secondary | ICD-10-CM | POA: Diagnosis not present

## 2019-08-29 DIAGNOSIS — G894 Chronic pain syndrome: Secondary | ICD-10-CM | POA: Diagnosis not present

## 2019-08-29 DIAGNOSIS — E1129 Type 2 diabetes mellitus with other diabetic kidney complication: Secondary | ICD-10-CM | POA: Diagnosis not present

## 2019-08-29 DIAGNOSIS — Z992 Dependence on renal dialysis: Secondary | ICD-10-CM | POA: Diagnosis not present

## 2019-08-29 DIAGNOSIS — F200 Paranoid schizophrenia: Secondary | ICD-10-CM | POA: Diagnosis not present

## 2019-08-29 DIAGNOSIS — N2581 Secondary hyperparathyroidism of renal origin: Secondary | ICD-10-CM | POA: Diagnosis not present

## 2019-08-29 DIAGNOSIS — M6281 Muscle weakness (generalized): Secondary | ICD-10-CM | POA: Diagnosis not present

## 2019-08-29 DIAGNOSIS — F419 Anxiety disorder, unspecified: Secondary | ICD-10-CM | POA: Diagnosis not present

## 2019-08-29 DIAGNOSIS — M2450 Contracture, unspecified joint: Secondary | ICD-10-CM | POA: Diagnosis not present

## 2019-08-29 DIAGNOSIS — D631 Anemia in chronic kidney disease: Secondary | ICD-10-CM | POA: Diagnosis not present

## 2019-08-29 DIAGNOSIS — D509 Iron deficiency anemia, unspecified: Secondary | ICD-10-CM | POA: Diagnosis not present

## 2019-08-29 DIAGNOSIS — Z741 Need for assistance with personal care: Secondary | ICD-10-CM | POA: Diagnosis not present

## 2019-08-31 DIAGNOSIS — F419 Anxiety disorder, unspecified: Secondary | ICD-10-CM | POA: Diagnosis not present

## 2019-08-31 DIAGNOSIS — F200 Paranoid schizophrenia: Secondary | ICD-10-CM | POA: Diagnosis not present

## 2019-08-31 DIAGNOSIS — G894 Chronic pain syndrome: Secondary | ICD-10-CM | POA: Diagnosis not present

## 2019-08-31 DIAGNOSIS — M2450 Contracture, unspecified joint: Secondary | ICD-10-CM | POA: Diagnosis not present

## 2019-08-31 DIAGNOSIS — M6281 Muscle weakness (generalized): Secondary | ICD-10-CM | POA: Diagnosis not present

## 2019-08-31 DIAGNOSIS — Z741 Need for assistance with personal care: Secondary | ICD-10-CM | POA: Diagnosis not present

## 2019-09-01 DIAGNOSIS — Z992 Dependence on renal dialysis: Secondary | ICD-10-CM | POA: Diagnosis not present

## 2019-09-01 DIAGNOSIS — M2450 Contracture, unspecified joint: Secondary | ICD-10-CM | POA: Diagnosis not present

## 2019-09-01 DIAGNOSIS — D509 Iron deficiency anemia, unspecified: Secondary | ICD-10-CM | POA: Diagnosis not present

## 2019-09-01 DIAGNOSIS — D631 Anemia in chronic kidney disease: Secondary | ICD-10-CM | POA: Diagnosis not present

## 2019-09-01 DIAGNOSIS — G894 Chronic pain syndrome: Secondary | ICD-10-CM | POA: Diagnosis not present

## 2019-09-01 DIAGNOSIS — M6281 Muscle weakness (generalized): Secondary | ICD-10-CM | POA: Diagnosis not present

## 2019-09-01 DIAGNOSIS — F200 Paranoid schizophrenia: Secondary | ICD-10-CM | POA: Diagnosis not present

## 2019-09-01 DIAGNOSIS — Z741 Need for assistance with personal care: Secondary | ICD-10-CM | POA: Diagnosis not present

## 2019-09-01 DIAGNOSIS — N2581 Secondary hyperparathyroidism of renal origin: Secondary | ICD-10-CM | POA: Diagnosis not present

## 2019-09-01 DIAGNOSIS — N186 End stage renal disease: Secondary | ICD-10-CM | POA: Diagnosis not present

## 2019-09-01 DIAGNOSIS — F419 Anxiety disorder, unspecified: Secondary | ICD-10-CM | POA: Diagnosis not present

## 2019-09-01 DIAGNOSIS — E1129 Type 2 diabetes mellitus with other diabetic kidney complication: Secondary | ICD-10-CM | POA: Diagnosis not present

## 2019-09-02 DIAGNOSIS — M6281 Muscle weakness (generalized): Secondary | ICD-10-CM | POA: Diagnosis not present

## 2019-09-02 DIAGNOSIS — F200 Paranoid schizophrenia: Secondary | ICD-10-CM | POA: Diagnosis not present

## 2019-09-02 DIAGNOSIS — M2450 Contracture, unspecified joint: Secondary | ICD-10-CM | POA: Diagnosis not present

## 2019-09-02 DIAGNOSIS — Z741 Need for assistance with personal care: Secondary | ICD-10-CM | POA: Diagnosis not present

## 2019-09-02 DIAGNOSIS — F419 Anxiety disorder, unspecified: Secondary | ICD-10-CM | POA: Diagnosis not present

## 2019-09-02 DIAGNOSIS — G894 Chronic pain syndrome: Secondary | ICD-10-CM | POA: Diagnosis not present

## 2019-09-03 DIAGNOSIS — D509 Iron deficiency anemia, unspecified: Secondary | ICD-10-CM | POA: Diagnosis not present

## 2019-09-03 DIAGNOSIS — M6281 Muscle weakness (generalized): Secondary | ICD-10-CM | POA: Diagnosis not present

## 2019-09-03 DIAGNOSIS — Z992 Dependence on renal dialysis: Secondary | ICD-10-CM | POA: Diagnosis not present

## 2019-09-03 DIAGNOSIS — E1129 Type 2 diabetes mellitus with other diabetic kidney complication: Secondary | ICD-10-CM | POA: Diagnosis not present

## 2019-09-03 DIAGNOSIS — M2450 Contracture, unspecified joint: Secondary | ICD-10-CM | POA: Diagnosis not present

## 2019-09-03 DIAGNOSIS — N186 End stage renal disease: Secondary | ICD-10-CM | POA: Diagnosis not present

## 2019-09-03 DIAGNOSIS — N2581 Secondary hyperparathyroidism of renal origin: Secondary | ICD-10-CM | POA: Diagnosis not present

## 2019-09-03 DIAGNOSIS — D631 Anemia in chronic kidney disease: Secondary | ICD-10-CM | POA: Diagnosis not present

## 2019-09-03 DIAGNOSIS — F200 Paranoid schizophrenia: Secondary | ICD-10-CM | POA: Diagnosis not present

## 2019-09-03 DIAGNOSIS — Z741 Need for assistance with personal care: Secondary | ICD-10-CM | POA: Diagnosis not present

## 2019-09-03 DIAGNOSIS — G894 Chronic pain syndrome: Secondary | ICD-10-CM | POA: Diagnosis not present

## 2019-09-03 DIAGNOSIS — F419 Anxiety disorder, unspecified: Secondary | ICD-10-CM | POA: Diagnosis not present

## 2019-09-04 DIAGNOSIS — N186 End stage renal disease: Secondary | ICD-10-CM | POA: Diagnosis not present

## 2019-09-04 DIAGNOSIS — M6281 Muscle weakness (generalized): Secondary | ICD-10-CM | POA: Diagnosis not present

## 2019-09-04 DIAGNOSIS — E1122 Type 2 diabetes mellitus with diabetic chronic kidney disease: Secondary | ICD-10-CM | POA: Diagnosis not present

## 2019-09-04 DIAGNOSIS — E785 Hyperlipidemia, unspecified: Secondary | ICD-10-CM | POA: Diagnosis not present

## 2019-09-04 DIAGNOSIS — E559 Vitamin D deficiency, unspecified: Secondary | ICD-10-CM | POA: Diagnosis not present

## 2019-09-04 DIAGNOSIS — Z741 Need for assistance with personal care: Secondary | ICD-10-CM | POA: Diagnosis not present

## 2019-09-04 DIAGNOSIS — F419 Anxiety disorder, unspecified: Secondary | ICD-10-CM | POA: Diagnosis not present

## 2019-09-04 DIAGNOSIS — G894 Chronic pain syndrome: Secondary | ICD-10-CM | POA: Diagnosis not present

## 2019-09-04 DIAGNOSIS — M2450 Contracture, unspecified joint: Secondary | ICD-10-CM | POA: Diagnosis not present

## 2019-09-04 DIAGNOSIS — F200 Paranoid schizophrenia: Secondary | ICD-10-CM | POA: Diagnosis not present

## 2019-09-05 DIAGNOSIS — E1122 Type 2 diabetes mellitus with diabetic chronic kidney disease: Secondary | ICD-10-CM | POA: Diagnosis not present

## 2019-09-05 DIAGNOSIS — E1129 Type 2 diabetes mellitus with other diabetic kidney complication: Secondary | ICD-10-CM | POA: Diagnosis not present

## 2019-09-05 DIAGNOSIS — F419 Anxiety disorder, unspecified: Secondary | ICD-10-CM | POA: Diagnosis not present

## 2019-09-05 DIAGNOSIS — D631 Anemia in chronic kidney disease: Secondary | ICD-10-CM | POA: Diagnosis not present

## 2019-09-05 DIAGNOSIS — N186 End stage renal disease: Secondary | ICD-10-CM | POA: Diagnosis not present

## 2019-09-05 DIAGNOSIS — D509 Iron deficiency anemia, unspecified: Secondary | ICD-10-CM | POA: Diagnosis not present

## 2019-09-05 DIAGNOSIS — I10 Essential (primary) hypertension: Secondary | ICD-10-CM | POA: Diagnosis not present

## 2019-09-05 DIAGNOSIS — F200 Paranoid schizophrenia: Secondary | ICD-10-CM | POA: Diagnosis not present

## 2019-09-05 DIAGNOSIS — Z741 Need for assistance with personal care: Secondary | ICD-10-CM | POA: Diagnosis not present

## 2019-09-05 DIAGNOSIS — G894 Chronic pain syndrome: Secondary | ICD-10-CM | POA: Diagnosis not present

## 2019-09-05 DIAGNOSIS — N2581 Secondary hyperparathyroidism of renal origin: Secondary | ICD-10-CM | POA: Diagnosis not present

## 2019-09-05 DIAGNOSIS — M6281 Muscle weakness (generalized): Secondary | ICD-10-CM | POA: Diagnosis not present

## 2019-09-05 DIAGNOSIS — Z992 Dependence on renal dialysis: Secondary | ICD-10-CM | POA: Diagnosis not present

## 2019-09-05 DIAGNOSIS — M2450 Contracture, unspecified joint: Secondary | ICD-10-CM | POA: Diagnosis not present

## 2019-09-08 DIAGNOSIS — M2450 Contracture, unspecified joint: Secondary | ICD-10-CM | POA: Diagnosis not present

## 2019-09-08 DIAGNOSIS — Z741 Need for assistance with personal care: Secondary | ICD-10-CM | POA: Diagnosis not present

## 2019-09-08 DIAGNOSIS — F419 Anxiety disorder, unspecified: Secondary | ICD-10-CM | POA: Diagnosis not present

## 2019-09-08 DIAGNOSIS — M6281 Muscle weakness (generalized): Secondary | ICD-10-CM | POA: Diagnosis not present

## 2019-09-08 DIAGNOSIS — D509 Iron deficiency anemia, unspecified: Secondary | ICD-10-CM | POA: Diagnosis not present

## 2019-09-08 DIAGNOSIS — E1129 Type 2 diabetes mellitus with other diabetic kidney complication: Secondary | ICD-10-CM | POA: Diagnosis not present

## 2019-09-08 DIAGNOSIS — F200 Paranoid schizophrenia: Secondary | ICD-10-CM | POA: Diagnosis not present

## 2019-09-08 DIAGNOSIS — N186 End stage renal disease: Secondary | ICD-10-CM | POA: Diagnosis not present

## 2019-09-08 DIAGNOSIS — G894 Chronic pain syndrome: Secondary | ICD-10-CM | POA: Diagnosis not present

## 2019-09-08 DIAGNOSIS — Z992 Dependence on renal dialysis: Secondary | ICD-10-CM | POA: Diagnosis not present

## 2019-09-08 DIAGNOSIS — D631 Anemia in chronic kidney disease: Secondary | ICD-10-CM | POA: Diagnosis not present

## 2019-09-08 DIAGNOSIS — N2581 Secondary hyperparathyroidism of renal origin: Secondary | ICD-10-CM | POA: Diagnosis not present

## 2019-09-09 DIAGNOSIS — M2450 Contracture, unspecified joint: Secondary | ICD-10-CM | POA: Diagnosis not present

## 2019-09-09 DIAGNOSIS — Z741 Need for assistance with personal care: Secondary | ICD-10-CM | POA: Diagnosis not present

## 2019-09-09 DIAGNOSIS — G894 Chronic pain syndrome: Secondary | ICD-10-CM | POA: Diagnosis not present

## 2019-09-09 DIAGNOSIS — F419 Anxiety disorder, unspecified: Secondary | ICD-10-CM | POA: Diagnosis not present

## 2019-09-09 DIAGNOSIS — M6281 Muscle weakness (generalized): Secondary | ICD-10-CM | POA: Diagnosis not present

## 2019-09-09 DIAGNOSIS — F200 Paranoid schizophrenia: Secondary | ICD-10-CM | POA: Diagnosis not present

## 2019-09-10 DIAGNOSIS — N2581 Secondary hyperparathyroidism of renal origin: Secondary | ICD-10-CM | POA: Diagnosis not present

## 2019-09-10 DIAGNOSIS — E1129 Type 2 diabetes mellitus with other diabetic kidney complication: Secondary | ICD-10-CM | POA: Diagnosis not present

## 2019-09-10 DIAGNOSIS — Z992 Dependence on renal dialysis: Secondary | ICD-10-CM | POA: Diagnosis not present

## 2019-09-10 DIAGNOSIS — Z741 Need for assistance with personal care: Secondary | ICD-10-CM | POA: Diagnosis not present

## 2019-09-10 DIAGNOSIS — M6281 Muscle weakness (generalized): Secondary | ICD-10-CM | POA: Diagnosis not present

## 2019-09-10 DIAGNOSIS — D509 Iron deficiency anemia, unspecified: Secondary | ICD-10-CM | POA: Diagnosis not present

## 2019-09-10 DIAGNOSIS — F200 Paranoid schizophrenia: Secondary | ICD-10-CM | POA: Diagnosis not present

## 2019-09-10 DIAGNOSIS — N186 End stage renal disease: Secondary | ICD-10-CM | POA: Diagnosis not present

## 2019-09-10 DIAGNOSIS — M2450 Contracture, unspecified joint: Secondary | ICD-10-CM | POA: Diagnosis not present

## 2019-09-10 DIAGNOSIS — F419 Anxiety disorder, unspecified: Secondary | ICD-10-CM | POA: Diagnosis not present

## 2019-09-10 DIAGNOSIS — G894 Chronic pain syndrome: Secondary | ICD-10-CM | POA: Diagnosis not present

## 2019-09-10 DIAGNOSIS — D631 Anemia in chronic kidney disease: Secondary | ICD-10-CM | POA: Diagnosis not present

## 2019-09-11 DIAGNOSIS — G894 Chronic pain syndrome: Secondary | ICD-10-CM | POA: Diagnosis not present

## 2019-09-11 DIAGNOSIS — M6281 Muscle weakness (generalized): Secondary | ICD-10-CM | POA: Diagnosis not present

## 2019-09-11 DIAGNOSIS — M2450 Contracture, unspecified joint: Secondary | ICD-10-CM | POA: Diagnosis not present

## 2019-09-11 DIAGNOSIS — F419 Anxiety disorder, unspecified: Secondary | ICD-10-CM | POA: Diagnosis not present

## 2019-09-11 DIAGNOSIS — Z741 Need for assistance with personal care: Secondary | ICD-10-CM | POA: Diagnosis not present

## 2019-09-11 DIAGNOSIS — F200 Paranoid schizophrenia: Secondary | ICD-10-CM | POA: Diagnosis not present

## 2019-09-12 DIAGNOSIS — E1129 Type 2 diabetes mellitus with other diabetic kidney complication: Secondary | ICD-10-CM | POA: Diagnosis not present

## 2019-09-12 DIAGNOSIS — F419 Anxiety disorder, unspecified: Secondary | ICD-10-CM | POA: Diagnosis not present

## 2019-09-12 DIAGNOSIS — F200 Paranoid schizophrenia: Secondary | ICD-10-CM | POA: Diagnosis not present

## 2019-09-12 DIAGNOSIS — N186 End stage renal disease: Secondary | ICD-10-CM | POA: Diagnosis not present

## 2019-09-12 DIAGNOSIS — Z741 Need for assistance with personal care: Secondary | ICD-10-CM | POA: Diagnosis not present

## 2019-09-12 DIAGNOSIS — N2581 Secondary hyperparathyroidism of renal origin: Secondary | ICD-10-CM | POA: Diagnosis not present

## 2019-09-12 DIAGNOSIS — M2450 Contracture, unspecified joint: Secondary | ICD-10-CM | POA: Diagnosis not present

## 2019-09-12 DIAGNOSIS — M6281 Muscle weakness (generalized): Secondary | ICD-10-CM | POA: Diagnosis not present

## 2019-09-12 DIAGNOSIS — G894 Chronic pain syndrome: Secondary | ICD-10-CM | POA: Diagnosis not present

## 2019-09-12 DIAGNOSIS — D631 Anemia in chronic kidney disease: Secondary | ICD-10-CM | POA: Diagnosis not present

## 2019-09-12 DIAGNOSIS — Z992 Dependence on renal dialysis: Secondary | ICD-10-CM | POA: Diagnosis not present

## 2019-09-12 DIAGNOSIS — D509 Iron deficiency anemia, unspecified: Secondary | ICD-10-CM | POA: Diagnosis not present

## 2019-09-15 DIAGNOSIS — F200 Paranoid schizophrenia: Secondary | ICD-10-CM | POA: Diagnosis not present

## 2019-09-15 DIAGNOSIS — M2450 Contracture, unspecified joint: Secondary | ICD-10-CM | POA: Diagnosis not present

## 2019-09-15 DIAGNOSIS — Z741 Need for assistance with personal care: Secondary | ICD-10-CM | POA: Diagnosis not present

## 2019-09-15 DIAGNOSIS — F419 Anxiety disorder, unspecified: Secondary | ICD-10-CM | POA: Diagnosis not present

## 2019-09-15 DIAGNOSIS — M6281 Muscle weakness (generalized): Secondary | ICD-10-CM | POA: Diagnosis not present

## 2019-09-15 DIAGNOSIS — D509 Iron deficiency anemia, unspecified: Secondary | ICD-10-CM | POA: Diagnosis not present

## 2019-09-15 DIAGNOSIS — Z992 Dependence on renal dialysis: Secondary | ICD-10-CM | POA: Diagnosis not present

## 2019-09-15 DIAGNOSIS — E1129 Type 2 diabetes mellitus with other diabetic kidney complication: Secondary | ICD-10-CM | POA: Diagnosis not present

## 2019-09-15 DIAGNOSIS — N186 End stage renal disease: Secondary | ICD-10-CM | POA: Diagnosis not present

## 2019-09-15 DIAGNOSIS — N2581 Secondary hyperparathyroidism of renal origin: Secondary | ICD-10-CM | POA: Diagnosis not present

## 2019-09-15 DIAGNOSIS — G894 Chronic pain syndrome: Secondary | ICD-10-CM | POA: Diagnosis not present

## 2019-09-15 DIAGNOSIS — D631 Anemia in chronic kidney disease: Secondary | ICD-10-CM | POA: Diagnosis not present

## 2019-09-16 DIAGNOSIS — F419 Anxiety disorder, unspecified: Secondary | ICD-10-CM | POA: Diagnosis not present

## 2019-09-16 DIAGNOSIS — F200 Paranoid schizophrenia: Secondary | ICD-10-CM | POA: Diagnosis not present

## 2019-09-16 DIAGNOSIS — Z20828 Contact with and (suspected) exposure to other viral communicable diseases: Secondary | ICD-10-CM | POA: Diagnosis not present

## 2019-09-16 DIAGNOSIS — G894 Chronic pain syndrome: Secondary | ICD-10-CM | POA: Diagnosis not present

## 2019-09-16 DIAGNOSIS — M6281 Muscle weakness (generalized): Secondary | ICD-10-CM | POA: Diagnosis not present

## 2019-09-16 DIAGNOSIS — Z741 Need for assistance with personal care: Secondary | ICD-10-CM | POA: Diagnosis not present

## 2019-09-16 DIAGNOSIS — M2450 Contracture, unspecified joint: Secondary | ICD-10-CM | POA: Diagnosis not present

## 2019-09-17 DIAGNOSIS — M6281 Muscle weakness (generalized): Secondary | ICD-10-CM | POA: Diagnosis not present

## 2019-09-17 DIAGNOSIS — F200 Paranoid schizophrenia: Secondary | ICD-10-CM | POA: Diagnosis not present

## 2019-09-17 DIAGNOSIS — N2581 Secondary hyperparathyroidism of renal origin: Secondary | ICD-10-CM | POA: Diagnosis not present

## 2019-09-17 DIAGNOSIS — E1129 Type 2 diabetes mellitus with other diabetic kidney complication: Secondary | ICD-10-CM | POA: Diagnosis not present

## 2019-09-17 DIAGNOSIS — F419 Anxiety disorder, unspecified: Secondary | ICD-10-CM | POA: Diagnosis not present

## 2019-09-17 DIAGNOSIS — M2450 Contracture, unspecified joint: Secondary | ICD-10-CM | POA: Diagnosis not present

## 2019-09-17 DIAGNOSIS — D509 Iron deficiency anemia, unspecified: Secondary | ICD-10-CM | POA: Diagnosis not present

## 2019-09-17 DIAGNOSIS — N186 End stage renal disease: Secondary | ICD-10-CM | POA: Diagnosis not present

## 2019-09-17 DIAGNOSIS — Z992 Dependence on renal dialysis: Secondary | ICD-10-CM | POA: Diagnosis not present

## 2019-09-17 DIAGNOSIS — Z741 Need for assistance with personal care: Secondary | ICD-10-CM | POA: Diagnosis not present

## 2019-09-17 DIAGNOSIS — G894 Chronic pain syndrome: Secondary | ICD-10-CM | POA: Diagnosis not present

## 2019-09-17 DIAGNOSIS — D631 Anemia in chronic kidney disease: Secondary | ICD-10-CM | POA: Diagnosis not present

## 2019-09-18 DIAGNOSIS — M6281 Muscle weakness (generalized): Secondary | ICD-10-CM | POA: Diagnosis not present

## 2019-09-18 DIAGNOSIS — G894 Chronic pain syndrome: Secondary | ICD-10-CM | POA: Diagnosis not present

## 2019-09-18 DIAGNOSIS — M2450 Contracture, unspecified joint: Secondary | ICD-10-CM | POA: Diagnosis not present

## 2019-09-18 DIAGNOSIS — F200 Paranoid schizophrenia: Secondary | ICD-10-CM | POA: Diagnosis not present

## 2019-09-18 DIAGNOSIS — F419 Anxiety disorder, unspecified: Secondary | ICD-10-CM | POA: Diagnosis not present

## 2019-09-18 DIAGNOSIS — Z741 Need for assistance with personal care: Secondary | ICD-10-CM | POA: Diagnosis not present

## 2019-09-19 DIAGNOSIS — N186 End stage renal disease: Secondary | ICD-10-CM | POA: Diagnosis not present

## 2019-09-19 DIAGNOSIS — N2581 Secondary hyperparathyroidism of renal origin: Secondary | ICD-10-CM | POA: Diagnosis not present

## 2019-09-19 DIAGNOSIS — F419 Anxiety disorder, unspecified: Secondary | ICD-10-CM | POA: Diagnosis not present

## 2019-09-19 DIAGNOSIS — E785 Hyperlipidemia, unspecified: Secondary | ICD-10-CM | POA: Diagnosis not present

## 2019-09-19 DIAGNOSIS — I10 Essential (primary) hypertension: Secondary | ICD-10-CM | POA: Diagnosis not present

## 2019-09-19 DIAGNOSIS — G894 Chronic pain syndrome: Secondary | ICD-10-CM | POA: Diagnosis not present

## 2019-09-19 DIAGNOSIS — E1122 Type 2 diabetes mellitus with diabetic chronic kidney disease: Secondary | ICD-10-CM | POA: Diagnosis not present

## 2019-09-19 DIAGNOSIS — Z992 Dependence on renal dialysis: Secondary | ICD-10-CM | POA: Diagnosis not present

## 2019-09-19 DIAGNOSIS — D509 Iron deficiency anemia, unspecified: Secondary | ICD-10-CM | POA: Diagnosis not present

## 2019-09-19 DIAGNOSIS — F200 Paranoid schizophrenia: Secondary | ICD-10-CM | POA: Diagnosis not present

## 2019-09-19 DIAGNOSIS — E1129 Type 2 diabetes mellitus with other diabetic kidney complication: Secondary | ICD-10-CM | POA: Diagnosis not present

## 2019-09-19 DIAGNOSIS — Z741 Need for assistance with personal care: Secondary | ICD-10-CM | POA: Diagnosis not present

## 2019-09-19 DIAGNOSIS — M2450 Contracture, unspecified joint: Secondary | ICD-10-CM | POA: Diagnosis not present

## 2019-09-19 DIAGNOSIS — M6281 Muscle weakness (generalized): Secondary | ICD-10-CM | POA: Diagnosis not present

## 2019-09-19 DIAGNOSIS — D631 Anemia in chronic kidney disease: Secondary | ICD-10-CM | POA: Diagnosis not present

## 2019-09-22 DIAGNOSIS — Z992 Dependence on renal dialysis: Secondary | ICD-10-CM | POA: Diagnosis not present

## 2019-09-22 DIAGNOSIS — E1129 Type 2 diabetes mellitus with other diabetic kidney complication: Secondary | ICD-10-CM | POA: Diagnosis not present

## 2019-09-22 DIAGNOSIS — M2450 Contracture, unspecified joint: Secondary | ICD-10-CM | POA: Diagnosis not present

## 2019-09-22 DIAGNOSIS — M6281 Muscle weakness (generalized): Secondary | ICD-10-CM | POA: Diagnosis not present

## 2019-09-22 DIAGNOSIS — D631 Anemia in chronic kidney disease: Secondary | ICD-10-CM | POA: Diagnosis not present

## 2019-09-22 DIAGNOSIS — Z741 Need for assistance with personal care: Secondary | ICD-10-CM | POA: Diagnosis not present

## 2019-09-22 DIAGNOSIS — F419 Anxiety disorder, unspecified: Secondary | ICD-10-CM | POA: Diagnosis not present

## 2019-09-22 DIAGNOSIS — G894 Chronic pain syndrome: Secondary | ICD-10-CM | POA: Diagnosis not present

## 2019-09-22 DIAGNOSIS — N2581 Secondary hyperparathyroidism of renal origin: Secondary | ICD-10-CM | POA: Diagnosis not present

## 2019-09-22 DIAGNOSIS — N186 End stage renal disease: Secondary | ICD-10-CM | POA: Diagnosis not present

## 2019-09-22 DIAGNOSIS — D509 Iron deficiency anemia, unspecified: Secondary | ICD-10-CM | POA: Diagnosis not present

## 2019-09-22 DIAGNOSIS — F200 Paranoid schizophrenia: Secondary | ICD-10-CM | POA: Diagnosis not present

## 2019-09-23 DIAGNOSIS — G894 Chronic pain syndrome: Secondary | ICD-10-CM | POA: Diagnosis not present

## 2019-09-23 DIAGNOSIS — M6281 Muscle weakness (generalized): Secondary | ICD-10-CM | POA: Diagnosis not present

## 2019-09-23 DIAGNOSIS — F419 Anxiety disorder, unspecified: Secondary | ICD-10-CM | POA: Diagnosis not present

## 2019-09-23 DIAGNOSIS — Z741 Need for assistance with personal care: Secondary | ICD-10-CM | POA: Diagnosis not present

## 2019-09-23 DIAGNOSIS — M2450 Contracture, unspecified joint: Secondary | ICD-10-CM | POA: Diagnosis not present

## 2019-09-23 DIAGNOSIS — F200 Paranoid schizophrenia: Secondary | ICD-10-CM | POA: Diagnosis not present

## 2019-09-24 DIAGNOSIS — N186 End stage renal disease: Secondary | ICD-10-CM | POA: Diagnosis not present

## 2019-09-24 DIAGNOSIS — Z992 Dependence on renal dialysis: Secondary | ICD-10-CM | POA: Diagnosis not present

## 2019-09-24 DIAGNOSIS — D631 Anemia in chronic kidney disease: Secondary | ICD-10-CM | POA: Diagnosis not present

## 2019-09-24 DIAGNOSIS — M6281 Muscle weakness (generalized): Secondary | ICD-10-CM | POA: Diagnosis not present

## 2019-09-24 DIAGNOSIS — G894 Chronic pain syndrome: Secondary | ICD-10-CM | POA: Diagnosis not present

## 2019-09-24 DIAGNOSIS — F419 Anxiety disorder, unspecified: Secondary | ICD-10-CM | POA: Diagnosis not present

## 2019-09-24 DIAGNOSIS — Z741 Need for assistance with personal care: Secondary | ICD-10-CM | POA: Diagnosis not present

## 2019-09-24 DIAGNOSIS — M2450 Contracture, unspecified joint: Secondary | ICD-10-CM | POA: Diagnosis not present

## 2019-09-24 DIAGNOSIS — E1129 Type 2 diabetes mellitus with other diabetic kidney complication: Secondary | ICD-10-CM | POA: Diagnosis not present

## 2019-09-24 DIAGNOSIS — F200 Paranoid schizophrenia: Secondary | ICD-10-CM | POA: Diagnosis not present

## 2019-09-24 DIAGNOSIS — D509 Iron deficiency anemia, unspecified: Secondary | ICD-10-CM | POA: Diagnosis not present

## 2019-09-24 DIAGNOSIS — N2581 Secondary hyperparathyroidism of renal origin: Secondary | ICD-10-CM | POA: Diagnosis not present

## 2019-09-25 DIAGNOSIS — G894 Chronic pain syndrome: Secondary | ICD-10-CM | POA: Diagnosis not present

## 2019-09-25 DIAGNOSIS — F419 Anxiety disorder, unspecified: Secondary | ICD-10-CM | POA: Diagnosis not present

## 2019-09-25 DIAGNOSIS — R2689 Other abnormalities of gait and mobility: Secondary | ICD-10-CM | POA: Diagnosis not present

## 2019-09-25 DIAGNOSIS — Z992 Dependence on renal dialysis: Secondary | ICD-10-CM | POA: Diagnosis not present

## 2019-09-25 DIAGNOSIS — N186 End stage renal disease: Secondary | ICD-10-CM | POA: Diagnosis not present

## 2019-09-25 DIAGNOSIS — F200 Paranoid schizophrenia: Secondary | ICD-10-CM | POA: Diagnosis not present

## 2019-09-25 DIAGNOSIS — E1129 Type 2 diabetes mellitus with other diabetic kidney complication: Secondary | ICD-10-CM | POA: Diagnosis not present

## 2019-09-25 DIAGNOSIS — Z741 Need for assistance with personal care: Secondary | ICD-10-CM | POA: Diagnosis not present

## 2019-09-25 DIAGNOSIS — M2450 Contracture, unspecified joint: Secondary | ICD-10-CM | POA: Diagnosis not present

## 2019-09-25 DIAGNOSIS — M6281 Muscle weakness (generalized): Secondary | ICD-10-CM | POA: Diagnosis not present

## 2019-09-25 DIAGNOSIS — Z89411 Acquired absence of right great toe: Secondary | ICD-10-CM | POA: Diagnosis not present

## 2019-09-26 DIAGNOSIS — F419 Anxiety disorder, unspecified: Secondary | ICD-10-CM | POA: Diagnosis not present

## 2019-09-26 DIAGNOSIS — M2450 Contracture, unspecified joint: Secondary | ICD-10-CM | POA: Diagnosis not present

## 2019-09-26 DIAGNOSIS — N186 End stage renal disease: Secondary | ICD-10-CM | POA: Diagnosis not present

## 2019-09-26 DIAGNOSIS — Z992 Dependence on renal dialysis: Secondary | ICD-10-CM | POA: Diagnosis not present

## 2019-09-26 DIAGNOSIS — F200 Paranoid schizophrenia: Secondary | ICD-10-CM | POA: Diagnosis not present

## 2019-09-26 DIAGNOSIS — N2581 Secondary hyperparathyroidism of renal origin: Secondary | ICD-10-CM | POA: Diagnosis not present

## 2019-09-26 DIAGNOSIS — D631 Anemia in chronic kidney disease: Secondary | ICD-10-CM | POA: Diagnosis not present

## 2019-09-26 DIAGNOSIS — M6281 Muscle weakness (generalized): Secondary | ICD-10-CM | POA: Diagnosis not present

## 2019-09-26 DIAGNOSIS — G894 Chronic pain syndrome: Secondary | ICD-10-CM | POA: Diagnosis not present

## 2019-09-26 DIAGNOSIS — D509 Iron deficiency anemia, unspecified: Secondary | ICD-10-CM | POA: Diagnosis not present

## 2019-09-26 DIAGNOSIS — Z741 Need for assistance with personal care: Secondary | ICD-10-CM | POA: Diagnosis not present

## 2019-09-29 DIAGNOSIS — N2581 Secondary hyperparathyroidism of renal origin: Secondary | ICD-10-CM | POA: Diagnosis not present

## 2019-09-29 DIAGNOSIS — M2450 Contracture, unspecified joint: Secondary | ICD-10-CM | POA: Diagnosis not present

## 2019-09-29 DIAGNOSIS — Z992 Dependence on renal dialysis: Secondary | ICD-10-CM | POA: Diagnosis not present

## 2019-09-29 DIAGNOSIS — D631 Anemia in chronic kidney disease: Secondary | ICD-10-CM | POA: Diagnosis not present

## 2019-09-29 DIAGNOSIS — G894 Chronic pain syndrome: Secondary | ICD-10-CM | POA: Diagnosis not present

## 2019-09-29 DIAGNOSIS — D509 Iron deficiency anemia, unspecified: Secondary | ICD-10-CM | POA: Diagnosis not present

## 2019-09-29 DIAGNOSIS — M6281 Muscle weakness (generalized): Secondary | ICD-10-CM | POA: Diagnosis not present

## 2019-09-29 DIAGNOSIS — F419 Anxiety disorder, unspecified: Secondary | ICD-10-CM | POA: Diagnosis not present

## 2019-09-29 DIAGNOSIS — N186 End stage renal disease: Secondary | ICD-10-CM | POA: Diagnosis not present

## 2019-09-29 DIAGNOSIS — F200 Paranoid schizophrenia: Secondary | ICD-10-CM | POA: Diagnosis not present

## 2019-09-29 DIAGNOSIS — Z741 Need for assistance with personal care: Secondary | ICD-10-CM | POA: Diagnosis not present

## 2019-09-30 DIAGNOSIS — F419 Anxiety disorder, unspecified: Secondary | ICD-10-CM | POA: Diagnosis not present

## 2019-09-30 DIAGNOSIS — M2450 Contracture, unspecified joint: Secondary | ICD-10-CM | POA: Diagnosis not present

## 2019-09-30 DIAGNOSIS — F200 Paranoid schizophrenia: Secondary | ICD-10-CM | POA: Diagnosis not present

## 2019-09-30 DIAGNOSIS — E119 Type 2 diabetes mellitus without complications: Secondary | ICD-10-CM | POA: Diagnosis not present

## 2019-09-30 DIAGNOSIS — M6281 Muscle weakness (generalized): Secondary | ICD-10-CM | POA: Diagnosis not present

## 2019-09-30 DIAGNOSIS — Z741 Need for assistance with personal care: Secondary | ICD-10-CM | POA: Diagnosis not present

## 2019-09-30 DIAGNOSIS — G894 Chronic pain syndrome: Secondary | ICD-10-CM | POA: Diagnosis not present

## 2019-10-01 DIAGNOSIS — Z741 Need for assistance with personal care: Secondary | ICD-10-CM | POA: Diagnosis not present

## 2019-10-01 DIAGNOSIS — Z992 Dependence on renal dialysis: Secondary | ICD-10-CM | POA: Diagnosis not present

## 2019-10-01 DIAGNOSIS — F419 Anxiety disorder, unspecified: Secondary | ICD-10-CM | POA: Diagnosis not present

## 2019-10-01 DIAGNOSIS — N2581 Secondary hyperparathyroidism of renal origin: Secondary | ICD-10-CM | POA: Diagnosis not present

## 2019-10-01 DIAGNOSIS — M6281 Muscle weakness (generalized): Secondary | ICD-10-CM | POA: Diagnosis not present

## 2019-10-01 DIAGNOSIS — M2450 Contracture, unspecified joint: Secondary | ICD-10-CM | POA: Diagnosis not present

## 2019-10-01 DIAGNOSIS — D631 Anemia in chronic kidney disease: Secondary | ICD-10-CM | POA: Diagnosis not present

## 2019-10-01 DIAGNOSIS — D509 Iron deficiency anemia, unspecified: Secondary | ICD-10-CM | POA: Diagnosis not present

## 2019-10-01 DIAGNOSIS — G894 Chronic pain syndrome: Secondary | ICD-10-CM | POA: Diagnosis not present

## 2019-10-01 DIAGNOSIS — E1129 Type 2 diabetes mellitus with other diabetic kidney complication: Secondary | ICD-10-CM | POA: Diagnosis not present

## 2019-10-01 DIAGNOSIS — N186 End stage renal disease: Secondary | ICD-10-CM | POA: Diagnosis not present

## 2019-10-01 DIAGNOSIS — F200 Paranoid schizophrenia: Secondary | ICD-10-CM | POA: Diagnosis not present

## 2019-10-02 DIAGNOSIS — R5381 Other malaise: Secondary | ICD-10-CM | POA: Diagnosis not present

## 2019-10-02 DIAGNOSIS — N186 End stage renal disease: Secondary | ICD-10-CM | POA: Diagnosis not present

## 2019-10-02 DIAGNOSIS — F200 Paranoid schizophrenia: Secondary | ICD-10-CM | POA: Diagnosis not present

## 2019-10-02 DIAGNOSIS — G894 Chronic pain syndrome: Secondary | ICD-10-CM | POA: Diagnosis not present

## 2019-10-02 DIAGNOSIS — M2450 Contracture, unspecified joint: Secondary | ICD-10-CM | POA: Diagnosis not present

## 2019-10-02 DIAGNOSIS — Z741 Need for assistance with personal care: Secondary | ICD-10-CM | POA: Diagnosis not present

## 2019-10-02 DIAGNOSIS — E1122 Type 2 diabetes mellitus with diabetic chronic kidney disease: Secondary | ICD-10-CM | POA: Diagnosis not present

## 2019-10-02 DIAGNOSIS — E785 Hyperlipidemia, unspecified: Secondary | ICD-10-CM | POA: Diagnosis not present

## 2019-10-02 DIAGNOSIS — F419 Anxiety disorder, unspecified: Secondary | ICD-10-CM | POA: Diagnosis not present

## 2019-10-02 DIAGNOSIS — M6281 Muscle weakness (generalized): Secondary | ICD-10-CM | POA: Diagnosis not present

## 2019-10-03 DIAGNOSIS — M2450 Contracture, unspecified joint: Secondary | ICD-10-CM | POA: Diagnosis not present

## 2019-10-03 DIAGNOSIS — Z741 Need for assistance with personal care: Secondary | ICD-10-CM | POA: Diagnosis not present

## 2019-10-03 DIAGNOSIS — N186 End stage renal disease: Secondary | ICD-10-CM | POA: Diagnosis not present

## 2019-10-03 DIAGNOSIS — F200 Paranoid schizophrenia: Secondary | ICD-10-CM | POA: Diagnosis not present

## 2019-10-03 DIAGNOSIS — D631 Anemia in chronic kidney disease: Secondary | ICD-10-CM | POA: Diagnosis not present

## 2019-10-03 DIAGNOSIS — D509 Iron deficiency anemia, unspecified: Secondary | ICD-10-CM | POA: Diagnosis not present

## 2019-10-03 DIAGNOSIS — E1122 Type 2 diabetes mellitus with diabetic chronic kidney disease: Secondary | ICD-10-CM | POA: Diagnosis not present

## 2019-10-03 DIAGNOSIS — N2581 Secondary hyperparathyroidism of renal origin: Secondary | ICD-10-CM | POA: Diagnosis not present

## 2019-10-03 DIAGNOSIS — M6281 Muscle weakness (generalized): Secondary | ICD-10-CM | POA: Diagnosis not present

## 2019-10-03 DIAGNOSIS — Z992 Dependence on renal dialysis: Secondary | ICD-10-CM | POA: Diagnosis not present

## 2019-10-03 DIAGNOSIS — G894 Chronic pain syndrome: Secondary | ICD-10-CM | POA: Diagnosis not present

## 2019-10-03 DIAGNOSIS — I10 Essential (primary) hypertension: Secondary | ICD-10-CM | POA: Diagnosis not present

## 2019-10-03 DIAGNOSIS — F419 Anxiety disorder, unspecified: Secondary | ICD-10-CM | POA: Diagnosis not present

## 2019-10-06 DIAGNOSIS — G894 Chronic pain syndrome: Secondary | ICD-10-CM | POA: Diagnosis not present

## 2019-10-06 DIAGNOSIS — M6281 Muscle weakness (generalized): Secondary | ICD-10-CM | POA: Diagnosis not present

## 2019-10-06 DIAGNOSIS — M2450 Contracture, unspecified joint: Secondary | ICD-10-CM | POA: Diagnosis not present

## 2019-10-06 DIAGNOSIS — D509 Iron deficiency anemia, unspecified: Secondary | ICD-10-CM | POA: Diagnosis not present

## 2019-10-06 DIAGNOSIS — Z992 Dependence on renal dialysis: Secondary | ICD-10-CM | POA: Diagnosis not present

## 2019-10-06 DIAGNOSIS — F200 Paranoid schizophrenia: Secondary | ICD-10-CM | POA: Diagnosis not present

## 2019-10-06 DIAGNOSIS — D631 Anemia in chronic kidney disease: Secondary | ICD-10-CM | POA: Diagnosis not present

## 2019-10-06 DIAGNOSIS — N186 End stage renal disease: Secondary | ICD-10-CM | POA: Diagnosis not present

## 2019-10-06 DIAGNOSIS — Z741 Need for assistance with personal care: Secondary | ICD-10-CM | POA: Diagnosis not present

## 2019-10-06 DIAGNOSIS — F419 Anxiety disorder, unspecified: Secondary | ICD-10-CM | POA: Diagnosis not present

## 2019-10-06 DIAGNOSIS — N2581 Secondary hyperparathyroidism of renal origin: Secondary | ICD-10-CM | POA: Diagnosis not present

## 2019-10-07 DIAGNOSIS — G894 Chronic pain syndrome: Secondary | ICD-10-CM | POA: Diagnosis not present

## 2019-10-07 DIAGNOSIS — M2450 Contracture, unspecified joint: Secondary | ICD-10-CM | POA: Diagnosis not present

## 2019-10-07 DIAGNOSIS — F200 Paranoid schizophrenia: Secondary | ICD-10-CM | POA: Diagnosis not present

## 2019-10-07 DIAGNOSIS — Z741 Need for assistance with personal care: Secondary | ICD-10-CM | POA: Diagnosis not present

## 2019-10-07 DIAGNOSIS — M6281 Muscle weakness (generalized): Secondary | ICD-10-CM | POA: Diagnosis not present

## 2019-10-07 DIAGNOSIS — F419 Anxiety disorder, unspecified: Secondary | ICD-10-CM | POA: Diagnosis not present

## 2019-10-08 DIAGNOSIS — Z992 Dependence on renal dialysis: Secondary | ICD-10-CM | POA: Diagnosis not present

## 2019-10-08 DIAGNOSIS — N2581 Secondary hyperparathyroidism of renal origin: Secondary | ICD-10-CM | POA: Diagnosis not present

## 2019-10-08 DIAGNOSIS — F419 Anxiety disorder, unspecified: Secondary | ICD-10-CM | POA: Diagnosis not present

## 2019-10-08 DIAGNOSIS — M6281 Muscle weakness (generalized): Secondary | ICD-10-CM | POA: Diagnosis not present

## 2019-10-08 DIAGNOSIS — D631 Anemia in chronic kidney disease: Secondary | ICD-10-CM | POA: Diagnosis not present

## 2019-10-08 DIAGNOSIS — N186 End stage renal disease: Secondary | ICD-10-CM | POA: Diagnosis not present

## 2019-10-08 DIAGNOSIS — M2450 Contracture, unspecified joint: Secondary | ICD-10-CM | POA: Diagnosis not present

## 2019-10-08 DIAGNOSIS — F200 Paranoid schizophrenia: Secondary | ICD-10-CM | POA: Diagnosis not present

## 2019-10-08 DIAGNOSIS — G894 Chronic pain syndrome: Secondary | ICD-10-CM | POA: Diagnosis not present

## 2019-10-08 DIAGNOSIS — Z741 Need for assistance with personal care: Secondary | ICD-10-CM | POA: Diagnosis not present

## 2019-10-08 DIAGNOSIS — D509 Iron deficiency anemia, unspecified: Secondary | ICD-10-CM | POA: Diagnosis not present

## 2019-10-09 DIAGNOSIS — M6281 Muscle weakness (generalized): Secondary | ICD-10-CM | POA: Diagnosis not present

## 2019-10-09 DIAGNOSIS — F419 Anxiety disorder, unspecified: Secondary | ICD-10-CM | POA: Diagnosis not present

## 2019-10-09 DIAGNOSIS — M2450 Contracture, unspecified joint: Secondary | ICD-10-CM | POA: Diagnosis not present

## 2019-10-09 DIAGNOSIS — Z741 Need for assistance with personal care: Secondary | ICD-10-CM | POA: Diagnosis not present

## 2019-10-09 DIAGNOSIS — G894 Chronic pain syndrome: Secondary | ICD-10-CM | POA: Diagnosis not present

## 2019-10-09 DIAGNOSIS — F200 Paranoid schizophrenia: Secondary | ICD-10-CM | POA: Diagnosis not present

## 2019-10-10 DIAGNOSIS — N2581 Secondary hyperparathyroidism of renal origin: Secondary | ICD-10-CM | POA: Diagnosis not present

## 2019-10-10 DIAGNOSIS — N186 End stage renal disease: Secondary | ICD-10-CM | POA: Diagnosis not present

## 2019-10-10 DIAGNOSIS — D509 Iron deficiency anemia, unspecified: Secondary | ICD-10-CM | POA: Diagnosis not present

## 2019-10-10 DIAGNOSIS — Z992 Dependence on renal dialysis: Secondary | ICD-10-CM | POA: Diagnosis not present

## 2019-10-10 DIAGNOSIS — D631 Anemia in chronic kidney disease: Secondary | ICD-10-CM | POA: Diagnosis not present

## 2019-10-13 DIAGNOSIS — D631 Anemia in chronic kidney disease: Secondary | ICD-10-CM | POA: Diagnosis not present

## 2019-10-13 DIAGNOSIS — D509 Iron deficiency anemia, unspecified: Secondary | ICD-10-CM | POA: Diagnosis not present

## 2019-10-13 DIAGNOSIS — N186 End stage renal disease: Secondary | ICD-10-CM | POA: Diagnosis not present

## 2019-10-13 DIAGNOSIS — Z992 Dependence on renal dialysis: Secondary | ICD-10-CM | POA: Diagnosis not present

## 2019-10-13 DIAGNOSIS — N2581 Secondary hyperparathyroidism of renal origin: Secondary | ICD-10-CM | POA: Diagnosis not present

## 2019-10-15 DIAGNOSIS — D509 Iron deficiency anemia, unspecified: Secondary | ICD-10-CM | POA: Diagnosis not present

## 2019-10-15 DIAGNOSIS — Z992 Dependence on renal dialysis: Secondary | ICD-10-CM | POA: Diagnosis not present

## 2019-10-15 DIAGNOSIS — D631 Anemia in chronic kidney disease: Secondary | ICD-10-CM | POA: Diagnosis not present

## 2019-10-15 DIAGNOSIS — N2581 Secondary hyperparathyroidism of renal origin: Secondary | ICD-10-CM | POA: Diagnosis not present

## 2019-10-15 DIAGNOSIS — N186 End stage renal disease: Secondary | ICD-10-CM | POA: Diagnosis not present

## 2019-10-17 DIAGNOSIS — N2581 Secondary hyperparathyroidism of renal origin: Secondary | ICD-10-CM | POA: Diagnosis not present

## 2019-10-17 DIAGNOSIS — D509 Iron deficiency anemia, unspecified: Secondary | ICD-10-CM | POA: Diagnosis not present

## 2019-10-17 DIAGNOSIS — D631 Anemia in chronic kidney disease: Secondary | ICD-10-CM | POA: Diagnosis not present

## 2019-10-17 DIAGNOSIS — N186 End stage renal disease: Secondary | ICD-10-CM | POA: Diagnosis not present

## 2019-10-17 DIAGNOSIS — Z992 Dependence on renal dialysis: Secondary | ICD-10-CM | POA: Diagnosis not present

## 2019-10-19 DIAGNOSIS — I10 Essential (primary) hypertension: Secondary | ICD-10-CM | POA: Diagnosis not present

## 2019-10-19 DIAGNOSIS — E1122 Type 2 diabetes mellitus with diabetic chronic kidney disease: Secondary | ICD-10-CM | POA: Diagnosis not present

## 2019-10-19 DIAGNOSIS — E785 Hyperlipidemia, unspecified: Secondary | ICD-10-CM | POA: Diagnosis not present

## 2019-10-20 DIAGNOSIS — Z992 Dependence on renal dialysis: Secondary | ICD-10-CM | POA: Diagnosis not present

## 2019-10-20 DIAGNOSIS — N2581 Secondary hyperparathyroidism of renal origin: Secondary | ICD-10-CM | POA: Diagnosis not present

## 2019-10-20 DIAGNOSIS — D509 Iron deficiency anemia, unspecified: Secondary | ICD-10-CM | POA: Diagnosis not present

## 2019-10-20 DIAGNOSIS — D631 Anemia in chronic kidney disease: Secondary | ICD-10-CM | POA: Diagnosis not present

## 2019-10-20 DIAGNOSIS — N186 End stage renal disease: Secondary | ICD-10-CM | POA: Diagnosis not present

## 2019-10-20 DIAGNOSIS — Z20828 Contact with and (suspected) exposure to other viral communicable diseases: Secondary | ICD-10-CM | POA: Diagnosis not present

## 2019-10-20 DIAGNOSIS — R112 Nausea with vomiting, unspecified: Secondary | ICD-10-CM | POA: Diagnosis not present

## 2019-10-22 DIAGNOSIS — E1122 Type 2 diabetes mellitus with diabetic chronic kidney disease: Secondary | ICD-10-CM | POA: Diagnosis not present

## 2019-10-22 DIAGNOSIS — R112 Nausea with vomiting, unspecified: Secondary | ICD-10-CM | POA: Diagnosis not present

## 2019-10-22 DIAGNOSIS — I10 Essential (primary) hypertension: Secondary | ICD-10-CM | POA: Diagnosis not present

## 2019-10-22 DIAGNOSIS — N186 End stage renal disease: Secondary | ICD-10-CM | POA: Diagnosis not present

## 2019-10-22 DIAGNOSIS — Z992 Dependence on renal dialysis: Secondary | ICD-10-CM | POA: Diagnosis not present

## 2019-10-22 DIAGNOSIS — D631 Anemia in chronic kidney disease: Secondary | ICD-10-CM | POA: Diagnosis not present

## 2019-10-22 DIAGNOSIS — U071 COVID-19: Secondary | ICD-10-CM | POA: Diagnosis not present

## 2019-10-22 DIAGNOSIS — N2581 Secondary hyperparathyroidism of renal origin: Secondary | ICD-10-CM | POA: Diagnosis not present

## 2019-10-22 DIAGNOSIS — D509 Iron deficiency anemia, unspecified: Secondary | ICD-10-CM | POA: Diagnosis not present

## 2019-10-25 ENCOUNTER — Inpatient Hospital Stay (HOSPITAL_COMMUNITY)
Admission: EM | Admit: 2019-10-25 | Discharge: 2019-10-29 | DRG: 177 | Disposition: A | Discharge status: Skilled Nursing Facility | Payer: Medicare Other | Attending: Internal Medicine | Admitting: Internal Medicine

## 2019-10-25 ENCOUNTER — Encounter (HOSPITAL_COMMUNITY): Payer: Self-pay | Admitting: Emergency Medicine

## 2019-10-25 ENCOUNTER — Emergency Department (HOSPITAL_COMMUNITY): Payer: Medicare Other

## 2019-10-25 ENCOUNTER — Other Ambulatory Visit: Payer: Self-pay

## 2019-10-25 DIAGNOSIS — I5042 Chronic combined systolic (congestive) and diastolic (congestive) heart failure: Secondary | ICD-10-CM | POA: Diagnosis present

## 2019-10-25 DIAGNOSIS — N186 End stage renal disease: Secondary | ICD-10-CM | POA: Diagnosis not present

## 2019-10-25 DIAGNOSIS — R0602 Shortness of breath: Secondary | ICD-10-CM | POA: Diagnosis not present

## 2019-10-25 DIAGNOSIS — I2510 Atherosclerotic heart disease of native coronary artery without angina pectoris: Secondary | ICD-10-CM | POA: Diagnosis present

## 2019-10-25 DIAGNOSIS — J1289 Other viral pneumonia: Secondary | ICD-10-CM | POA: Diagnosis not present

## 2019-10-25 DIAGNOSIS — Z992 Dependence on renal dialysis: Secondary | ICD-10-CM

## 2019-10-25 DIAGNOSIS — I10 Essential (primary) hypertension: Secondary | ICD-10-CM | POA: Diagnosis not present

## 2019-10-25 DIAGNOSIS — E875 Hyperkalemia: Secondary | ICD-10-CM | POA: Diagnosis not present

## 2019-10-25 DIAGNOSIS — I132 Hypertensive heart and chronic kidney disease with heart failure and with stage 5 chronic kidney disease, or end stage renal disease: Secondary | ICD-10-CM | POA: Diagnosis present

## 2019-10-25 DIAGNOSIS — N2581 Secondary hyperparathyroidism of renal origin: Secondary | ICD-10-CM | POA: Diagnosis present

## 2019-10-25 DIAGNOSIS — J9601 Acute respiratory failure with hypoxia: Secondary | ICD-10-CM | POA: Diagnosis present

## 2019-10-25 DIAGNOSIS — D631 Anemia in chronic kidney disease: Secondary | ICD-10-CM | POA: Diagnosis not present

## 2019-10-25 DIAGNOSIS — I214 Non-ST elevation (NSTEMI) myocardial infarction: Secondary | ICD-10-CM | POA: Diagnosis not present

## 2019-10-25 DIAGNOSIS — Z7902 Long term (current) use of antithrombotics/antiplatelets: Secondary | ICD-10-CM

## 2019-10-25 DIAGNOSIS — Z794 Long term (current) use of insulin: Secondary | ICD-10-CM | POA: Diagnosis not present

## 2019-10-25 DIAGNOSIS — Z7189 Other specified counseling: Secondary | ICD-10-CM

## 2019-10-25 DIAGNOSIS — E1165 Type 2 diabetes mellitus with hyperglycemia: Secondary | ICD-10-CM | POA: Diagnosis present

## 2019-10-25 DIAGNOSIS — U071 COVID-19: Secondary | ICD-10-CM | POA: Diagnosis not present

## 2019-10-25 DIAGNOSIS — E1122 Type 2 diabetes mellitus with diabetic chronic kidney disease: Secondary | ICD-10-CM | POA: Diagnosis present

## 2019-10-25 DIAGNOSIS — E871 Hypo-osmolality and hyponatremia: Secondary | ICD-10-CM | POA: Diagnosis present

## 2019-10-25 DIAGNOSIS — I252 Old myocardial infarction: Secondary | ICD-10-CM

## 2019-10-25 DIAGNOSIS — I5043 Acute on chronic combined systolic (congestive) and diastolic (congestive) heart failure: Secondary | ICD-10-CM | POA: Diagnosis present

## 2019-10-25 DIAGNOSIS — H9190 Unspecified hearing loss, unspecified ear: Secondary | ICD-10-CM | POA: Diagnosis not present

## 2019-10-25 DIAGNOSIS — I739 Peripheral vascular disease, unspecified: Secondary | ICD-10-CM | POA: Diagnosis present

## 2019-10-25 DIAGNOSIS — R0603 Acute respiratory distress: Secondary | ICD-10-CM | POA: Diagnosis not present

## 2019-10-25 DIAGNOSIS — E1169 Type 2 diabetes mellitus with other specified complication: Secondary | ICD-10-CM

## 2019-10-25 DIAGNOSIS — E1129 Type 2 diabetes mellitus with other diabetic kidney complication: Secondary | ICD-10-CM | POA: Diagnosis not present

## 2019-10-25 DIAGNOSIS — H913 Deaf nonspeaking, not elsewhere classified: Secondary | ICD-10-CM | POA: Diagnosis present

## 2019-10-25 DIAGNOSIS — Z89432 Acquired absence of left foot: Secondary | ICD-10-CM

## 2019-10-25 DIAGNOSIS — R0902 Hypoxemia: Secondary | ICD-10-CM | POA: Diagnosis not present

## 2019-10-25 DIAGNOSIS — Z8674 Personal history of sudden cardiac arrest: Secondary | ICD-10-CM

## 2019-10-25 DIAGNOSIS — E785 Hyperlipidemia, unspecified: Secondary | ICD-10-CM

## 2019-10-25 DIAGNOSIS — I071 Rheumatic tricuspid insufficiency: Secondary | ICD-10-CM | POA: Diagnosis present

## 2019-10-25 DIAGNOSIS — Z89431 Acquired absence of right foot: Secondary | ICD-10-CM

## 2019-10-25 DIAGNOSIS — H547 Unspecified visual loss: Secondary | ICD-10-CM | POA: Diagnosis not present

## 2019-10-25 DIAGNOSIS — I120 Hypertensive chronic kidney disease with stage 5 chronic kidney disease or end stage renal disease: Secondary | ICD-10-CM | POA: Diagnosis not present

## 2019-10-25 DIAGNOSIS — N189 Chronic kidney disease, unspecified: Secondary | ICD-10-CM | POA: Diagnosis present

## 2019-10-25 DIAGNOSIS — F22 Delusional disorders: Secondary | ICD-10-CM | POA: Diagnosis present

## 2019-10-25 DIAGNOSIS — I272 Pulmonary hypertension, unspecified: Secondary | ICD-10-CM | POA: Diagnosis not present

## 2019-10-25 DIAGNOSIS — E1151 Type 2 diabetes mellitus with diabetic peripheral angiopathy without gangrene: Secondary | ICD-10-CM | POA: Diagnosis present

## 2019-10-25 DIAGNOSIS — Z515 Encounter for palliative care: Secondary | ICD-10-CM

## 2019-10-25 DIAGNOSIS — D259 Leiomyoma of uterus, unspecified: Secondary | ICD-10-CM | POA: Diagnosis present

## 2019-10-25 LAB — CBC WITH DIFFERENTIAL/PLATELET
Abs Immature Granulocytes: 0.04 10*3/uL (ref 0.00–0.07)
Basophils Absolute: 0 10*3/uL (ref 0.0–0.1)
Basophils Relative: 1 %
Eosinophils Absolute: 0 10*3/uL (ref 0.0–0.5)
Eosinophils Relative: 0 %
HCT: 30.6 % — ABNORMAL LOW (ref 36.0–46.0)
Hemoglobin: 9.9 g/dL — ABNORMAL LOW (ref 12.0–15.0)
Immature Granulocytes: 1 %
Lymphocytes Relative: 13 %
Lymphs Abs: 1.2 10*3/uL (ref 0.7–4.0)
MCH: 30.9 pg (ref 26.0–34.0)
MCHC: 32.4 g/dL (ref 30.0–36.0)
MCV: 95.6 fL (ref 80.0–100.0)
Monocytes Absolute: 0.7 10*3/uL (ref 0.1–1.0)
Monocytes Relative: 8 %
Neutro Abs: 6.7 10*3/uL (ref 1.7–7.7)
Neutrophils Relative %: 77 %
Platelets: 241 10*3/uL (ref 150–400)
RBC: 3.2 MIL/uL — ABNORMAL LOW (ref 3.87–5.11)
RDW: 15.6 % — ABNORMAL HIGH (ref 11.5–15.5)
WBC: 8.7 10*3/uL (ref 4.0–10.5)
nRBC: 0 % (ref 0.0–0.2)

## 2019-10-25 LAB — TROPONIN I (HIGH SENSITIVITY)
Troponin I (High Sensitivity): 3469 ng/L (ref <18)
Troponin I (High Sensitivity): 2867 ng/L (ref <18)
Troponin I (High Sensitivity): 2802 ng/L (ref <18)

## 2019-10-25 LAB — BASIC METABOLIC PANEL
Anion gap: 20 — ABNORMAL HIGH (ref 5–15)
BUN: 81 mg/dL — ABNORMAL HIGH (ref 8–23)
CO2: 24 mmol/L (ref 22–32)
Calcium: 7.8 mg/dL — ABNORMAL LOW (ref 8.9–10.3)
Chloride: 86 mmol/L — ABNORMAL LOW (ref 98–111)
Creatinine, Ser: 10.74 mg/dL — ABNORMAL HIGH (ref 0.44–1.00)
GFR calc Af Amer: 4 mL/min — ABNORMAL LOW (ref >60)
GFR calc non Af Amer: 3 mL/min — ABNORMAL LOW (ref >60)
Glucose, Bld: 192 mg/dL — ABNORMAL HIGH (ref 70–99)
Potassium: 5.1 mmol/L (ref 3.5–5.1)
Sodium: 130 mmol/L — ABNORMAL LOW (ref 135–145)

## 2019-10-25 LAB — HEPATIC FUNCTION PANEL
ALT: 20 U/L (ref 0–44)
AST: 31 U/L (ref 15–41)
Albumin: 3.4 g/dL — ABNORMAL LOW (ref 3.5–5.0)
Alkaline Phosphatase: 94 U/L (ref 38–126)
Bilirubin, Direct: 0.1 mg/dL (ref 0.0–0.2)
Indirect Bilirubin: 0.6 mg/dL (ref 0.3–0.9)
Total Bilirubin: 0.7 mg/dL (ref 0.3–1.2)
Total Protein: 7.4 g/dL (ref 6.5–8.1)

## 2019-10-25 LAB — D-DIMER, QUANTITATIVE: D-Dimer, Quant: 1.23 ug/mL-FEU — ABNORMAL HIGH (ref 0.00–0.50)

## 2019-10-25 LAB — CBG MONITORING, ED
Glucose-Capillary: 162 mg/dL — ABNORMAL HIGH (ref 70–99)
Glucose-Capillary: 129 mg/dL — ABNORMAL HIGH (ref 70–99)
Glucose-Capillary: 139 mg/dL — ABNORMAL HIGH (ref 70–99)

## 2019-10-25 LAB — FERRITIN: Ferritin: 984 ng/mL — ABNORMAL HIGH (ref 11–307)

## 2019-10-25 LAB — BRAIN NATRIURETIC PEPTIDE: B Natriuretic Peptide: 1329.5 pg/mL — ABNORMAL HIGH (ref 0.0–100.0)

## 2019-10-25 LAB — C-REACTIVE PROTEIN: CRP: <0.8 mg/dL (ref <1.0)

## 2019-10-25 LAB — FIBRINOGEN: Fibrinogen: 365 mg/dL (ref 210–475)

## 2019-10-25 LAB — GLUCOSE, CAPILLARY: Glucose-Capillary: 165 mg/dL — ABNORMAL HIGH (ref 70–99)

## 2019-10-25 LAB — HEPATITIS B SURFACE ANTIGEN: Hepatitis B Surface Ag: NONREACTIVE

## 2019-10-25 LAB — PROCALCITONIN: Procalcitonin: 2.93 ng/mL

## 2019-10-25 LAB — HIV ANTIBODY (ROUTINE TESTING W REFLEX): HIV Screen 4th Generation wRfx: NONREACTIVE

## 2019-10-25 LAB — LACTATE DEHYDROGENASE: LDH: 283 U/L — ABNORMAL HIGH (ref 98–192)

## 2019-10-25 MED ORDER — DEXTRAN 70-HYPROMELLOSE 0.1-0.3 % OP SOLN: 1.0000 [drp] | Freq: Two times a day (BID) | OPHTHALMIC | Status: DC | PRN

## 2019-10-25 MED ORDER — VITAMIN C 500 MG PO TABS
500.0000 mg | ORAL_TABLET | Freq: Every day | ORAL | Status: DC
  Administered 2019-10-26 – 2019-10-29 (×3): 500 mg via ORAL
  Filled 2019-10-25 (×4): qty 1

## 2019-10-25 MED ORDER — PROMOD PO LIQD: 60.0000 mL | Freq: Two times a day (BID) | ORAL | Status: DC

## 2019-10-25 MED ORDER — SODIUM CHLORIDE 0.9 % IV SOLN
500.0000 mg | INTRAVENOUS | Status: DC
  Administered 2019-10-25 – 2019-10-28 (×4): 500 mg via INTRAVENOUS
  Filled 2019-10-25 (×5): qty 500

## 2019-10-25 MED ORDER — SODIUM CHLORIDE 0.9% FLUSH
3.0000 mL | Freq: Two times a day (BID) | INTRAVENOUS | Status: DC
  Administered 2019-10-25 – 2019-10-29 (×7): 3 mL via INTRAVENOUS

## 2019-10-25 MED ORDER — POLYVINYL ALCOHOL 1.4 % OP SOLN
1.0000 [drp] | OPHTHALMIC | Status: DC | PRN
  Administered 2019-10-25: 1 [drp] via OPHTHALMIC
  Filled 2019-10-25: qty 15

## 2019-10-25 MED ORDER — FAMOTIDINE IN NACL 20-0.9 MG/50ML-% IV SOLN
20.0000 mg | Freq: Two times a day (BID) | INTRAVENOUS | Status: DC
  Administered 2019-10-25: 20 mg via INTRAVENOUS
  Filled 2019-10-25: qty 50

## 2019-10-25 MED ORDER — ZINC SULFATE 220 (50 ZN) MG PO CAPS
220.0000 mg | ORAL_CAPSULE | Freq: Every day | ORAL | Status: DC
  Administered 2019-10-25 – 2019-10-29 (×5): 220 mg via ORAL
  Filled 2019-10-25 (×5): qty 1

## 2019-10-25 MED ORDER — SEVELAMER CARBONATE 800 MG PO TABS
1600.0000 mg | ORAL_TABLET | Freq: Three times a day (TID) | ORAL | Status: DC
  Administered 2019-10-25 – 2019-10-29 (×12): 1600 mg via ORAL
  Filled 2019-10-25 (×14): qty 2

## 2019-10-25 MED ORDER — ACETAMINOPHEN 325 MG PO TABS: 650.0000 mg | ORAL_TABLET | Freq: Four times a day (QID) | ORAL | Status: DC | PRN

## 2019-10-25 MED ORDER — ATORVASTATIN CALCIUM 10 MG PO TABS
10.0000 mg | ORAL_TABLET | Freq: Every evening | ORAL | Status: DC
  Administered 2019-10-25 – 2019-10-28 (×4): 10 mg via ORAL
  Filled 2019-10-25 (×4): qty 1

## 2019-10-25 MED ORDER — ONDANSETRON HCL 4 MG PO TABS: 4.0000 mg | ORAL_TABLET | Freq: Four times a day (QID) | ORAL | Status: DC | PRN

## 2019-10-25 MED ORDER — INSULIN ASPART 100 UNIT/ML ~~LOC~~ SOLN
0.0000 [IU] | SUBCUTANEOUS | Status: DC
  Administered 2019-10-25: 1 [IU] via SUBCUTANEOUS
  Administered 2019-10-26: 2 [IU] via SUBCUTANEOUS
  Administered 2019-10-26: 1 [IU] via SUBCUTANEOUS
  Administered 2019-10-26 – 2019-10-27 (×3): 2 [IU] via SUBCUTANEOUS
  Administered 2019-10-27: 1 [IU] via SUBCUTANEOUS
  Administered 2019-10-27: 3 [IU] via SUBCUTANEOUS
  Administered 2019-10-27: 2 [IU] via SUBCUTANEOUS
  Administered 2019-10-27: 3 [IU] via SUBCUTANEOUS
  Administered 2019-10-28 – 2019-10-29 (×2): 1 [IU] via SUBCUTANEOUS
  Administered 2019-10-29: 2 [IU] via SUBCUTANEOUS

## 2019-10-25 MED ORDER — ASPIRIN 81 MG PO CHEW
324.0000 mg | CHEWABLE_TABLET | Freq: Once | ORAL | Status: AC
  Administered 2019-10-25: 324 mg via ORAL
  Filled 2019-10-25: qty 4

## 2019-10-25 MED ORDER — CLOPIDOGREL BISULFATE 75 MG PO TABS
75.0000 mg | ORAL_TABLET | Freq: Every evening | ORAL | Status: DC
  Administered 2019-10-25 – 2019-10-28 (×4): 75 mg via ORAL
  Filled 2019-10-25 (×4): qty 1

## 2019-10-25 MED ORDER — ALBUTEROL SULFATE HFA 108 (90 BASE) MCG/ACT IN AERS
2.0000 | INHALATION_SPRAY | Freq: Once | RESPIRATORY_TRACT | Status: AC
  Administered 2019-10-25: 2 via RESPIRATORY_TRACT
  Filled 2019-10-25: qty 6.7

## 2019-10-25 MED ORDER — CHLORHEXIDINE GLUCONATE CLOTH 2 % EX PADS
6.0000 | MEDICATED_PAD | Freq: Every day | CUTANEOUS | Status: DC
  Administered 2019-10-26 – 2019-10-29 (×4): 6 via TOPICAL

## 2019-10-25 MED ORDER — AMIODARONE HCL 200 MG PO TABS
200.0000 mg | ORAL_TABLET | Freq: Every evening | ORAL | Status: DC
  Administered 2019-10-25 – 2019-10-28 (×4): 200 mg via ORAL
  Filled 2019-10-25 (×4): qty 1

## 2019-10-25 MED ORDER — ARIPIPRAZOLE 5 MG PO TABS
15.0000 mg | ORAL_TABLET | Freq: Every day | ORAL | Status: DC
  Administered 2019-10-25 – 2019-10-29 (×5): 15 mg via ORAL
  Filled 2019-10-25 (×4): qty 3
  Filled 2019-10-25: qty 1

## 2019-10-25 MED ORDER — HYDROCOD POLST-CPM POLST ER 10-8 MG/5ML PO SUER: 5.0000 mL | Freq: Two times a day (BID) | ORAL | Status: DC | PRN

## 2019-10-25 MED ORDER — GUAIFENESIN-DM 100-10 MG/5ML PO SYRP
10.0000 mL | ORAL_SOLUTION | ORAL | Status: DC | PRN
  Administered 2019-10-27 – 2019-10-28 (×3): 10 mL via ORAL
  Filled 2019-10-25 (×3): qty 10

## 2019-10-25 MED ORDER — AMLODIPINE BESYLATE 5 MG PO TABS
5.0000 mg | ORAL_TABLET | Freq: Every day | ORAL | Status: DC
  Administered 2019-10-25 – 2019-10-29 (×4): 5 mg via ORAL
  Filled 2019-10-25 (×5): qty 1

## 2019-10-25 MED ORDER — NITROGLYCERIN 0.4 MG SL SUBL: 0.4000 mg | SUBLINGUAL_TABLET | SUBLINGUAL | Status: DC | PRN

## 2019-10-25 MED ORDER — HEPARIN SODIUM (PORCINE) 5000 UNIT/ML IJ SOLN
5000.0000 [IU] | Freq: Three times a day (TID) | INTRAMUSCULAR | Status: DC
  Administered 2019-10-25 – 2019-10-29 (×13): 5000 [IU] via SUBCUTANEOUS
  Filled 2019-10-25 (×12): qty 1

## 2019-10-25 MED ORDER — DEXAMETHASONE 4 MG PO TABS
6.0000 mg | ORAL_TABLET | ORAL | Status: DC
  Administered 2019-10-25 – 2019-10-29 (×5): 6 mg via ORAL
  Filled 2019-10-25 (×5): qty 2

## 2019-10-25 MED ORDER — SODIUM CHLORIDE 0.9 % IV SOLN
100.0000 mg | INTRAVENOUS | Status: DC
  Administered 2019-10-26 – 2019-10-28 (×3): 100 mg via INTRAVENOUS
  Filled 2019-10-25 (×4): qty 20

## 2019-10-25 MED ORDER — SODIUM CHLORIDE 0.9 % IV SOLN
1.0000 g | INTRAVENOUS | Status: DC
  Administered 2019-10-26 – 2019-10-28 (×3): 1 g via INTRAVENOUS
  Filled 2019-10-25 (×2): qty 1
  Filled 2019-10-25: qty 10
  Filled 2019-10-25: qty 1

## 2019-10-25 MED ORDER — POLYETHYLENE GLYCOL 3350 17 G PO PACK: 17.0000 g | PACK | Freq: Every day | ORAL | Status: DC | PRN

## 2019-10-25 MED ORDER — FAMOTIDINE IN NACL 20-0.9 MG/50ML-% IV SOLN
20.0000 mg | INTRAVENOUS | Status: DC
  Administered 2019-10-26 – 2019-10-29 (×4): 20 mg via INTRAVENOUS
  Filled 2019-10-25 (×4): qty 50

## 2019-10-25 MED ORDER — SODIUM CHLORIDE 0.9 % IV SOLN
200.0000 mg | Freq: Once | INTRAVENOUS | Status: DC
  Filled 2019-10-25: qty 40

## 2019-10-25 MED ORDER — ALBUTEROL SULFATE HFA 108 (90 BASE) MCG/ACT IN AERS
2.0000 | INHALATION_SPRAY | Freq: Four times a day (QID) | RESPIRATORY_TRACT | Status: DC
  Administered 2019-10-25 – 2019-10-29 (×17): 2 via RESPIRATORY_TRACT
  Filled 2019-10-25 (×2): qty 6.7

## 2019-10-25 MED ORDER — ONDANSETRON HCL 4 MG/2ML IJ SOLN: 4.0000 mg | Freq: Four times a day (QID) | INTRAMUSCULAR | Status: DC | PRN

## 2019-10-25 NOTE — Consult Note (Signed)
Renal Service Consult Note [facility]  [name] 10/25/2019 [place] Requesting Physician:  Dr Tamala Julian, R.   Reason for Consult:  ESRD w/ COVID infection HPI: The patient is a 63 y.o. year-old w/ hx of pHTN, CAD/ MI, cardiac arrest, HTN, IDDM, deaf/ blind and ESRD on HD TTS.  Pt tested + for COVID-19 on 10/26, five days ago. Presents now w/ SOB and hypoxia at the SNF down to 80% sats on RA, improved to 98% on 4L [place].  Brought to ED where was found to be tachypneic w/ slight ^wob. Has hx of ant wall MI w/ EF 30-35%. Trop was >3000.  CXR showed mild IS edema pattern. Pt is being admitted by [facility]. Asked to see for renal failure.   Saw patient in ED, no sig hx given as family was away from the room.    CXR showing mild-mod bilat IS infiltrates    ROS n/a  Past Medical History  Past Medical History:  Diagnosis Date  . Anemia    Due to fibroids.  . Anemia in CKD (chronic kidney disease) 12/14/2014  . Benign hypertension with ESRD (end-stage renal disease) ([place]) 02/12/2018  . Bilateral leg edema    a. Chronic.  Marland Kitchen Blind   . Cardiac arrest ([place]) 12/12/2014  . CHF (congestive heart failure) ([place])   . Coronary artery disease   . Deaf    USE SIGN INTERPRETER.  Divorced from husband but lives with him. Has daughter but she does not care for her.  . Diabetes mellitus    a. Per PCP note 2012 (A1C 9.2) - pt unwilling to take meds and was educated on risk of uncontrolled DM.  b. A1C 5.9 in 11/2013.  Marland Kitchen ESRD on hemodialysis ([place])    Mon, Wed, Fri  . Essential hypertension 08/09/2009   Qualifier: Diagnosis of  By: Sarita Haver  MD, Coralyn Mark    . Fibroid uterus   . Heart murmur   . Hyperlipidemia   . Hypertension    a. Has previously refused blood pressure meds.   . Leiomyoma of uterus 08/09/2009   Qualifier: Diagnosis of  By: Sarita Haver  MD, Coralyn Mark    . MI (myocardial infarction) ([place]) 11/2014  . Moderate tricuspid regurgitation   . Osteomyelitis ([place])   . Psychiatric  disorder    She frequently exhibits paranoia and has been diagnosed with psychotic d/o NOS during hospital stay in the past. She is tangetial and perseverative during her visits. She apparently has had a bad experience with mental health in [place] in the past and refuses to discuss mental issues for fear that she will be sent back there. Her paranoia, communication issues, financial woes and lack of fam  . Pulmonary hypertension ([place])    Past Surgical History  Past Surgical History:  Procedure Laterality Date  . AMPUTATION Bilateral 04/02/2015   Procedure: Amputation Bilateral Great Toes MTP Joint;  Surgeon: Newt Minion, MD;  Location: [place];  Service: Orthopedics;  Laterality: Bilateral;  . AMPUTATION Left 05/14/2015   Procedure: Left Transmetatarsal Amputation;  Surgeon: Newt Minion, MD;  Location: [place];  Service: Orthopedics;  Laterality: Left;  . AMPUTATION Right 08/20/2015   Procedure: Transmetatarsal Amputation Right Foot;  Surgeon: Newt Minion, MD;  Location: [place];  Service: Orthopedics;  Laterality: Right;  . AV FISTULA PLACEMENT Left 01/02/2014   Procedure: ARTERIOVENOUS (AV) FISTULA CREATION;  Surgeon: Rosetta Posner, MD;  Location: [place];  Service: Vascular;  Laterality:  Left;  . INSERTION OF DIALYSIS CATHETER Right 01/02/2014   Procedure: INSERTION OF DIALYSIS CATHETER;  Surgeon: Rosetta Posner, MD;  Location: [place];  Service: Vascular;  Laterality: Right;  . NO PAST SURGERIES    . ORIF PATELLA Left 05/17/2015   Procedure: OPEN REDUCTION INTERNAL (ORIF) FIXATION PATELLA;  Surgeon: Newt Minion, MD;  Location: [place];  Service: Orthopedics;  Laterality: Left;   Family History  Family History  Problem Relation Age of Onset  . Other Father        Drowned from fishing?  . Heart disease Other    Social History  reports that she has never smoked. She has never used smokeless tobacco. She reports that she does not drink alcohol or use drugs. Allergies No Known Allergies Home  medications Prior to Admission medications   Medication Sig Start Date End Date Taking? Authorizing Provider  amiodarone (PACERONE) 200 MG tablet Take 200 mg by mouth every evening.    Yes [provider]  amLODipine (NORVASC) 5 MG tablet Take 5 mg by mouth daily.   Yes [provider]  ARIPiprazole (ABILIFY) 15 MG tablet Take 15 mg by mouth daily.  05/17/18  Yes [provider]  atorvastatin (LIPITOR) 10 MG tablet Take 10 mg by mouth every evening.   Yes [provider]  Cholecalciferol (VITAMIN D3) 50 MCG (2000 UT) capsule Take 2,000 Units by mouth daily.   Yes [provider]  clopidogrel (PLAVIX) 75 MG tablet Take 75 mg by mouth every evening.   Yes [provider]  Dextran 70-Hypromellose (ARTIFICIAL TEARS) 0.1-0.3 % SOLN Apply 1 drop to eye every 12 (twelve) hours as needed (dryness).    Yes [provider]  folic acid-vitamin b complex-vitamin c-selenium-zinc (DIALYVITE) 3 MG TABS tablet Take 1 tablet by mouth daily.   Yes [provider]  insulin lispro (ADMELOG) 100 UNIT/ML injection Inject 0-5 Units into the skin See admin instructions. Per Sliding Scale twice dialy: 0-150 0units 151-400 5units   Yes [provider]  nitroGLYCERIN (NITROSTAT) 0.4 MG SL tablet Place 0.4 mg under the tongue every 5 (five) minutes as needed for chest pain.   Yes [provider]  Nutritional Supplements (PROMOD) LIQD Take 60 mLs by mouth 2 (two) times daily. 05/07/18  Yes [provider]  ondansetron (ZOFRAN) 4 MG tablet Take 4 mg by mouth every 6 (six) hours as needed for nausea or vomiting.   Yes [provider]  polyethylene glycol (MIRALAX / GLYCOLAX) packet Take 17 g by mouth daily.    Yes [provider]  sevelamer (RENAGEL) 800 MG tablet Take 1,600 mg by mouth 3 (three) times daily with meals.  09/03/18  Yes [provider]  traMADol (ULTRAM) 50 MG tablet Take 1 tablet (50 mg  total) by mouth daily. 10/14/18  Yes [name], DO   Liver Function Tests No results for input(s): AST, ALT, ALKPHOS, BILITOT, PROT, ALBUMIN in the last 168 hours. No results for input(s): LIPASE, AMYLASE in the last 168 hours. CBC Recent Labs  Lab 10/25/19 0641  WBC 8.7  NEUTROABS 6.7  HGB 9.9*  HCT 30.6*  MCV 95.6  PLT 546   Basic Metabolic Panel Recent Labs  Lab 10/25/19 0641  NA 130*  K 5.1  CL 86*  CO2 24  GLUCOSE 192*  BUN 81*  CREATININE 10.74*  CALCIUM 7.8*   Iron/TIBC/Ferritin/ %Sat    Component Value Date/Time   IRON 74 12/14/2014 1445  TIBC 216 (L) 12/14/2014 1445   FERRITIN 675 (H) 12/29/2013 1510   IRONPCTSAT 34 12/14/2014 1445    Vitals:   10/25/19 0930 10/25/19 0945 10/25/19 1000 10/25/19 1015  BP: 137/76 (!) 142/76 136/87 (!) 142/80  Pulse: 88 95    Resp: 18 (!) 21 (!) 22 (!) 21  Temp:      TempSrc:      SpO2: 96% 100%      Exam Gen older adult female, looks acutely and chronically ill, tired, on nasalO2 No rash, cyanosis or gangrene Sclera anicteric, throat not seen  No jvd or bruits Chest diffuse mild exp wheeze and soft scattered crackels bilat  RRR no MRG Abd soft ntnd no mass or ascites +bs obese GU defer MS bilat TMA Ext no sig LE or UE edema, no wounds or ulcers Neuro is alert, Ox 3 , nf     Home meds:  - amlodipine 5  - insulin lispro SSI ac  - aripiprazole 15 qd/ tramadol prn  - amiodarone 200 hs/ atrovastatin 10 hs/ clopidogrel 75/ sl ntg prn  - sevelamer 3 ac tid  - prn's/ vitamins/ supplements   CXR mild-mod bilat IS infiltrates   Outpt HD: [facility] TTS  4h  2/2.25  94.5kg  Hep 9000   LUA AVF   - mircera 60 q2wks, last 10/26  - hect 3  - parsabiv 5  - renvela 2 ac, no BP meds, usual wt gain 3-4kg     Assessment/ Plan: 1. COVID+ PNA - hypoxic likely due to COVID infection. Has not missed any HD and is typically compliant w/ fluids, etc. 2. ESRD - on HD TTS. Has not missed any HD. Due for HD today, see  orders. Will need to be in a room to get HD.  3. HTN - cont home norvasc as tol 4. IDDM 5. Deaf/ blind 6. CAD/ MI/ hx cardiac arrest 7. pulm HTN      Kelly Splinter  MD 10/25/2019, 11:42 AM

## 2019-10-25 NOTE — ED Notes (Signed)
Patient had bm prior to arrival. Patient cleaned up and purewick placed on patient.

## 2019-10-25 NOTE — Progress Notes (Signed)
Inpatient Diabetes Program Recommendations  AACE/ADA: New Consensus Statement on Inpatient Glycemic Control   Target Ranges:  Prepandial:   less than 140 mg/dL      Peak postprandial:   less than 180 mg/dL (1-2 hours)      Critically ill patients:  140 - 180 mg/dL   Results for LADEIDRA, BORYS (MRN [id number]) as of 10/25/2019 13:11  Ref. Range 10/25/2019 06:56  Glucose-Capillary Latest Ref Range: 70 - 99 mg/dL 162 (H)  Results for FRANCEEN, ERISMAN (MRN [id number]) as of 10/25/2019 13:11  Ref. Range 04/23/2018 00:00  Hemoglobin A1C Unknown 5.5   Review of Glycemic Control  Diabetes history: DM2 Outpatient Diabetes medications: Admelog 0-5 units BID based on correction scale Current orders for Inpatient glycemic control: Novolog 0-9 units Q4H; Decadron 6 mg Q24H  Inpatient Diabetes Program Recommendations:   HbgA1C: Please consider ordering an A1C to evaluate glycemic control over the past 2-3 months.  NOTE: Noted consult for Diabetes Coordinator. Diabetes Coordinator is not on campus over the weekend but available by pager from 8am to 5pm for questions or concerns. Chart reviewed. Noted patient is from [facility] facility, patient is blind, deaf, and has end stage renal disease and receives hemodialysis.  Agree with current insulin orders at this time. Will follow along while inpatient and make further recommendations based on glycemic trends.  Thanks, Barnie Alderman, RN, MSN, CDE Diabetes Coordinator Inpatient Diabetes Program [phone] (Team Pager from 8am to 5pm)

## 2019-10-25 NOTE — ED Notes (Signed)
Benita-daughter-called for pt information [phone]

## 2019-10-25 NOTE — ED Notes (Signed)
Dinner Tray Ordered @ 1723. 

## 2019-10-25 NOTE — ED Provider Notes (Signed)
[facility] EMERGENCY DEPARTMENT Provider Note   CSN: [id number] Arrival date & time: 10/25/19  0277    History   Chief Complaint No chief complaint on file.   HPI [name] is a 63 y.o. female.   The history is provided by the EMS personnel. The history is limited by the condition of the patient (Nonverbal).  She has history of hypertension, diabetes, hyperlipidemia, end-stage renal disease on hemodialysis and was diagnosed Covid +3 days ago and comes in because of shortness of breath with hypoxia.  She is reported to have had oxygen saturations which dropped down to 80% at her skilled nursing facility where she resides, improved to 98% with 4 L of nasal oxygen.  EMS reports oxygen saturation remained adequate on 2 L nasal oxygen.  Past Medical History:  Diagnosis Date  . Anemia    Due to fibroids.  . Anemia in CKD (chronic kidney disease) 12/14/2014  . Benign hypertension with ESRD (end-stage renal disease) ([place]) 02/12/2018  . Bilateral leg edema    a. Chronic.  Marland Kitchen Blind   . Cardiac arrest ([place]) 12/12/2014  . CHF (congestive heart failure) ([place])   . Coronary artery disease   . Deaf    USE SIGN INTERPRETER.  Divorced from husband but lives with him. Has daughter but she does not care for her.  . Diabetes mellitus    a. Per PCP note 2012 (A1C 9.2) - pt unwilling to take meds and was educated on risk of uncontrolled DM.  b. A1C 5.9 in 11/2013.  Marland Kitchen ESRD on hemodialysis ([place])    Mon, Wed, Fri  . Essential hypertension 08/09/2009   Qualifier: Diagnosis of  By: Sarita Haver  MD, Coralyn Mark    . Fibroid uterus   . Heart murmur   . Hyperlipidemia   . Hypertension    a. Has previously refused blood pressure meds.   . Leiomyoma of uterus 08/09/2009   Qualifier: Diagnosis of  By: Sarita Haver  MD, Coralyn Mark    . MI (myocardial infarction) ([place]) 11/2014  . Moderate tricuspid regurgitation   . Osteomyelitis ([place])   . Psychiatric disorder    She frequently exhibits paranoia and  has been diagnosed with psychotic d/o NOS during hospital stay in the past. She is tangetial and perseverative during her visits. She apparently has had a bad experience with mental health in [place] in the past and refuses to discuss mental issues for fear that she will be sent back there. Her paranoia, communication issues, financial woes and lack of fam  . Pulmonary hypertension ([place])     Patient Active Problem List   Diagnosis Date Noted  . Adjustment disorder with disturbance of conduct   . Benign hypertension with ESRD (end-stage renal disease) ([place]) 02/12/2018  . Uncontrolled type 2 diabetes mellitus with end-stage renal disease ([place]) 11/26/2017  . 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 Chronic combined systolic and diastolic congestive heart failure ([place]) 12/15/2015  . Dyslipidemia associated with type 2 diabetes mellitus ([place]) 12/15/2015  . Ventricular tachycardia, sustained ([place]) 12/15/2015  . Psychosis, paranoid ([place]) 08/20/2015  . Non compliance w medication regimen 08/20/2015  . Hyperkalemia 04/19/2015  . ESRD (end stage renal disease) ([place]) 04/19/2015  . PAD (peripheral artery disease) ([place]) 03/23/2015  . Acute encephalopathy   . FTT (failure to thrive) in adult 01/14/2015  . Blindness with deafness 12/16/2014  . Anemia in CKD (chronic kidney disease) 12/14/2014  . Cardiomyopathy, ischemic-EF 30-35% 11/21/2014  . Chronic constipation 12/24/2013  . Diabetes mellitus  with ESRD (end-stage renal disease) ([place]) Oct 01, 202014  . Hypertensive heart disease with CHF (congestive heart failure) ([place]) 08/09/2009    Past Surgical History:  Procedure Laterality Date  . AMPUTATION Bilateral 04/02/2015   Procedure: Amputation Bilateral Great Toes MTP Joint;  Surgeon: Newt Minion, MD;  Location: [place];  Service: Orthopedics;  Laterality: Bilateral;  . AMPUTATION Left 05/14/2015   Procedure: Left Transmetatarsal Amputation;  Surgeon: Newt Minion, MD;  Location: [place];  Service: Orthopedics;  Laterality: Left;   . AMPUTATION Right 08/20/2015   Procedure: Transmetatarsal Amputation Right Foot;  Surgeon: Newt Minion, MD;  Location: [place];  Service: Orthopedics;  Laterality: Right;  . AV FISTULA PLACEMENT Left 01/02/2014   Procedure: ARTERIOVENOUS (AV) FISTULA CREATION;  Surgeon: Rosetta Posner, MD;  Location: [place];  Service: Vascular;  Laterality: Left;  . INSERTION OF DIALYSIS CATHETER Right 01/02/2014   Procedure: INSERTION OF DIALYSIS CATHETER;  Surgeon: Rosetta Posner, MD;  Location: [place];  Service: Vascular;  Laterality: Right;  . NO PAST SURGERIES    . ORIF PATELLA Left 05/17/2015   Procedure: OPEN REDUCTION INTERNAL (ORIF) FIXATION PATELLA;  Surgeon: Newt Minion, MD;  Location: [place];  Service: Orthopedics;  Laterality: Left;     OB History    Gravida  1   Para  1   Term      Preterm      AB      Living  1     SAB      TAB      Ectopic      Multiple      Live Births               Home Medications    Prior to Admission medications   Medication Sig Start Date End Date Taking? Authorizing Provider  amiodarone (PACERONE) 200 MG tablet Take 200 mg by mouth every evening.     [provider]  ARIPiprazole (ABILIFY) 15 MG tablet Take 15 mg by mouth daily.  05/17/18   [provider]  atorvastatin (LIPITOR) 10 MG tablet Take 10 mg by mouth every evening.    [provider]  calcium acetate (PHOSLO) 667 MG tablet Take 3 tablets by mouth before meals every Sunday, Tuesday, Thursday and Saturday for Dialysis.  Give 3 tablets by mouth two times a day every Mon, Wed, Friday for renal buffer    [provider]  clopidogrel (PLAVIX) 75 MG tablet Take 75 mg by mouth every evening.    [provider]  Dextran 70-Hypromellose (ARTIFICIAL TEARS) 0.1-0.3 % SOLN Apply 1 drop to eye every 12 (twelve) hours as needed.    [provider]  insulin aspart (NOVOLOG) 100 UNIT/ML injection Inject as per sliding scale subcutaneously after meals 0 -  70 = 0 units, notify MD is BS is less than 70 71 - 450 = 5 units. Notify MD if BS is greater than 450    [provider]  nitroGLYCERIN (NITROSTAT) 0.4 MG SL tablet Place 0.4 mg under the tongue every 5 (five) minutes as needed for chest pain.    [provider]  Nutritional Supplements (NUTRITIONAL SUPPLEMENT PO) Low Concentrated Sweets diet - Regular texture, Regular / thin consistency 05/08/18   [provider]  Nutritional Supplements (PROMOD) LIQD Take 60 mLs by mouth 2 (two) times daily. 05/07/18   [provider]  polyethylene glycol (MIRALAX / GLYCOLAX) packet Take 17 g by mouth daily.  [provider]  sevelamer (RENAGEL) 800 MG tablet Give 4 tablets by mouth before meals 09/03/18   [provider]  traMADol (ULTRAM) 50 MG tablet Take 1 tablet (50 mg total) by mouth daily. 10/14/18   [name], DO    Family History Family History  Problem Relation Age of Onset  . Other Father        Drowned from fishing?  . Heart disease Unknown     Social History Social History   Tobacco Use  . Smoking status: Never Smoker  . Smokeless tobacco: Never Used  . Tobacco comment: Prior dip  Substance Use Topics  . Alcohol use: No  . Drug use: No     Allergies   Patient has no known allergies.   Review of Systems Review of Systems  Unable to perform ROS: Patient nonverbal     Physical Exam Updated Vital Signs BP (!) 165/83   Pulse 89   Temp 98.5 F (36.9 C) (Oral)   Resp (!) 25   SpO2 96%   Physical Exam Vitals signs and nursing note reviewed.    63 year old female, resting comfortably and in no acute distress. Vital signs are significant for elevated blood pressure and respiratory rate. Oxygen saturation is 96%, which is normal. Head is normocephalic and atraumatic. PERRLA, EOMI. Oropharynx is clear. Neck is nontender and supple without adenopathy or JVD. Back is nontender and there is no CVA tenderness. Lungs  have coarse expiratory wheezes diffusely.  There are no rales or rhonchi. Chest is nontender. Heart has regular rate and rhythm without murmur. Abdomen is soft, flat, nontender without masses or hepatosplenomegaly and peristalsis is normoactive. Extremities have no cyanosis or edema, full range of motion is present.  AV fistula present left upper arm with thrill present. Skin is warm and dry without rash. Neurologic: Mental status is normal, cranial nerves are intact, there are no motor or sensory deficits.  ED Treatments / Results  Labs (all labs ordered are listed, but only abnormal results are displayed) Labs Reviewed  CBC WITH DIFFERENTIAL/PLATELET  BASIC METABOLIC PANEL  TROPONIN I (HIGH SENSITIVITY)    EKG None  Radiology No results found.  Procedures Procedures   Medications Ordered in ED Medications  albuterol (VENTOLIN HFA) 108 (90 Base) MCG/ACT inhaler 2 puff (has no administration in time range)     Initial Impression / Assessment and Plan / ED Course  I have reviewed the triage vital signs and the nursing notes.  Pertinent labs & imaging results that were available during my care of the patient were reviewed by me and considered in my medical decision making (see chart for details).  COVID-19 infection with report of hypoxia.  Patient is not hypoxic on arrival to the ED.  Some evidence of bronchospasm on exam so albuterol is ordered.  Will check chest x-ray and screening labs.  Case is signed out to Dr. Sabra Heck.  COVID-19 infection with new oxygen requirement.  In the ED, she has some mild bronchospasm on exam but is maintaining adequate oxygenation without supplemental oxygen.  Will check chest x-ray and screening labs.  Old records are reviewed, and she has no relevant past visits.  Final Clinical Impressions(s) / ED Diagnoses   Final diagnoses:  COVID-19 virus infection  End-stage renal disease on hemodialysis [facility])    ED Discharge Orders    None        Delora Fuel, MD 41/93/79 [phone]

## 2019-10-25 NOTE — ED Notes (Signed)
[phone] Benita pts daughter   Would like to speak with someone about mothers care, pt is hearing and speech impaired

## 2019-10-25 NOTE — H&P (Signed)
History and Physical    [name]:[id number] DOB: 1956-11-27 DOA: 10/25/2019  Referring MD/NP/PA: Judge Stall, MD PCP: Patient, No Pcp Per  Patient coming from: [facility] care facility via EMS  Chief Complaint: Shortness of breath  I have personally briefly reviewed patient's old medical records in [place]   HPI: [name] is a 63 y.o. 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  Patient reportedly sent in for worsening shortness of breath.  History was initially limited due to the communication barrier.  The patient son notes that she started having vomiting, and was told that one of the therapist had [place].  She was tested 3 days ago and results were noted to be positive.  Since that time patient had been complaining of shortness of breath with O2 saturations noted to be as low as 80% at the skilled nursing facility with improvement to 98% on 4 L nasal cannula oxygen.  In route with EMS patient was weaned down to 2 L nasal cannula oxygen with O2 sats maintained.  ED Course: Upon admission into the emergency department patient was noted to be afebrile, pulse 88-101, respirations 17-28, pressure maintained, and O2 saturations currently maintained on 3 L of nasal cannula oxygen.  Labs significant for WBC 8.7, hemoglobin 9.9, sodium 130, chloride 86, BUN 81, creatinine 10.74 glucose 192, and high-sensitivity troponin 3469.  Chest x-ray noting interstitial edema or pneumonitis/chronic interstitial lung disease with mildly improved cardiomegaly.  Patient was given full dose aspirin and 2 puffs albuterol inhaler.  Case was initially discussed with cardiology who recommended treatment of COVID-19 symptoms.  Patient CODE STATUS was verified with the patient's son who is also power of attorney.   TRH called to admit.  Review of systems: Complete review of systems limited due to communication barriers.  Past Medical History:  Diagnosis Date   Anemia    Due to fibroids.   Anemia in CKD (chronic kidney disease) 12/14/2014   Benign hypertension with ESRD (end-stage renal disease) ([place]) 02/12/2018   Bilateral leg edema    a. Chronic.   Blind    Cardiac arrest ([place]) 12/12/2014   CHF (congestive heart failure) ([place])    Coronary artery disease    Deaf    USE SIGN INTERPRETER.  Divorced from husband but lives with him. Has daughter but she does not care for her.   Diabetes mellitus    a. Per PCP note 2012 (A1C 9.2) - pt unwilling to take meds and was educated on risk of uncontrolled DM.  b. A1C 5.9 in 11/2013.   ESRD on hemodialysis ([place])    Mon, Wed, Fri   Essential hypertension 08/09/2009   Qualifier: Diagnosis of  By: Sarita Haver  MD, Coralyn Mark     Fibroid uterus    Heart murmur    Hyperlipidemia    Hypertension    a. Has previously refused blood pressure meds.    Leiomyoma of uterus 08/09/2009   Qualifier: Diagnosis of  By: Sarita Haver  MD, Coralyn Mark     MI (myocardial infarction) ([place]) 11/2014   Moderate tricuspid regurgitation    Osteomyelitis ([place])    Psychiatric disorder    She frequently exhibits paranoia and has been diagnosed with psychotic d/o NOS during hospital stay in the past. She is tangetial and perseverative during  her visits. She apparently has had a bad experience with mental health in [place] in the past and refuses to discuss mental issues for fear that she will be sent back there. Her paranoia, communication issues, financial woes and lack of fam   Pulmonary hypertension ([place])     Past Surgical History:  Procedure Laterality Date   AMPUTATION Bilateral 04/02/2015   Procedure: Amputation Bilateral Great Toes MTP Joint;  Surgeon: Newt Minion, MD;  Location: [facility];  Service: Orthopedics;  Laterality: Bilateral;   AMPUTATION Left 05/14/2015    Procedure: Left Transmetatarsal Amputation;  Surgeon: Newt Minion, MD;  Location: [facility];  Service: Orthopedics;  Laterality: Left;   AMPUTATION Right 08/20/2015   Procedure: Transmetatarsal Amputation Right Foot;  Surgeon: Newt Minion, MD;  Location: [facility];  Service: Orthopedics;  Laterality: Right;   AV FISTULA PLACEMENT Left 01/02/2014   Procedure: ARTERIOVENOUS (AV) FISTULA CREATION;  Surgeon: Rosetta Posner, MD;  Location: [facility];  Service: Vascular;  Laterality: Left;   INSERTION OF DIALYSIS CATHETER Right 01/02/2014   Procedure: INSERTION OF DIALYSIS CATHETER;  Surgeon: Rosetta Posner, MD;  Location: [facility] OR;  Service: Vascular;  Laterality: Right;   NO PAST SURGERIES     ORIF PATELLA Left 05/17/2015   Procedure: OPEN REDUCTION INTERNAL (ORIF) FIXATION PATELLA;  Surgeon: Newt Minion, MD;  Location: [facility];  Service: Orthopedics;  Laterality: Left;     reports that she has never smoked. She has never used smokeless tobacco. She reports that she does not drink alcohol or use drugs.  No Known Allergies  Family History  Problem Relation Age of Onset   Other Father        Drowned from fishing?   Heart disease Other     Prior to Admission medications   Medication Sig Start Date End Date Taking? Authorizing Provider  amiodarone (PACERONE) 200 MG tablet Take 200 mg by mouth every evening.     [provider]  ARIPiprazole (ABILIFY) 15 MG tablet Take 15 mg by mouth daily.  05/17/18   [provider]  atorvastatin (LIPITOR) 10 MG tablet Take 10 mg by mouth every evening.    [provider]  calcium acetate (PHOSLO) 667 MG tablet Take 3 tablets by mouth before meals every Sunday, Tuesday, Thursday and Saturday for Dialysis.  Give 3 tablets by mouth two times a day every Mon, Wed, Friday for renal buffer    [provider]  clopidogrel (PLAVIX) 75 MG tablet Take 75 mg by mouth every evening.    [provider]  Dextran 70-Hypromellose (ARTIFICIAL  TEARS) 0.1-0.3 % SOLN Apply 1 drop to eye every 12 (twelve) hours as needed.    [provider]  insulin aspart (NOVOLOG) 100 UNIT/ML injection Inject as per sliding scale subcutaneously after meals 0 - 70 = 0 units, notify MD is BS is less than 70 71 - 450 = 5 units. Notify MD if BS is greater than 450    [provider]  nitroGLYCERIN (NITROSTAT) 0.4 MG SL tablet Place 0.4 mg under the tongue every 5 (five) minutes as needed for chest pain.    [provider]  Nutritional Supplements (NUTRITIONAL SUPPLEMENT PO) Low Concentrated Sweets diet - Regular texture, Regular / thin consistency 05/08/18   [provider]  Nutritional Supplements (PROMOD) LIQD Take 60 mLs by mouth 2 (two) times daily. 05/07/18   [provider]  polyethylene glycol (MIRALAX / GLYCOLAX) packet Take 17 g by  mouth daily.     [provider]  sevelamer (RENAGEL) 800 MG tablet Give 4 tablets by mouth before meals 09/03/18   [provider]  traMADol (ULTRAM) 50 MG tablet Take 1 tablet (50 mg total) by mouth daily. 10/14/18   [name], DO    Physical Exam:  Constitutional: Older African-American female who appears to be in no acute distress at this time Vitals:   10/25/19 0930 10/25/19 0945 10/25/19 1000 10/25/19 1015  BP: 137/76 (!) 142/76 136/87 (!) 142/80  Pulse: 88 95    Resp: 18 (!) 21 (!) 22 (!) 21  Temp:      TempSrc:      SpO2: 96% 100%     Eyes: Patient is blind. ENMT: Mucous membranes are moist. Posterior pharynx clear of any exudate or lesions. Patient is deaf. Neck: normal, supple, no masses, no thyromegaly Respiratory:Normal respiratory effort currently on 2 L nasal cannula oxygen with O2 saturations maintained.  Positive crackles heard in the mid to lower lung fields. Cardiovascular: Regular rate and rhythm, no murmurs / rubs / gallops.  Trace lower extremity edema. 2+ pedal pulses. No carotid bruits.  Left upper extremity fistula in  place. Abdomen: no tenderness, no masses palpated. No hepatosplenomegaly. Bowel sounds positive.  Musculoskeletal: no clubbing / cyanosis.  Transmetatarsal amputations of the left and right foot noted. Skin: no rashes, lesions, ulcers. No induration Neurologic: CN 2-12 grossly intact. Sensation intact, DTR normal. Strength 5/5 in all 4.  Psychiatric: Normal judgment and insight. Alert and oriented x 3. Normal mood.     Labs on Admission: I have personally reviewed following labs and imaging studies  CBC: Recent Labs  Lab 10/25/19 0641  WBC 8.7  NEUTROABS 6.7  HGB 9.9*  HCT 30.6*  MCV 95.6  PLT 762   Basic Metabolic Panel: Recent Labs  Lab 10/25/19 0641  NA 130*  K 5.1  CL 86*  CO2 24  GLUCOSE 192*  BUN 81*  CREATININE 10.74*  CALCIUM 7.8*   GFR: CrCl cannot be calculated (Unknown ideal weight.). Liver Function Tests: No results for input(s): AST, ALT, ALKPHOS, BILITOT, PROT, ALBUMIN in the last 168 hours. No results for input(s): LIPASE, AMYLASE in the last 168 hours. No results for input(s): AMMONIA in the last 168 hours. Coagulation Profile: No results for input(s): INR, PROTIME in the last 168 hours. Cardiac Enzymes: No results for input(s): CKTOTAL, CKMB, CKMBINDEX, TROPONINI in the last 168 hours. BNP (last 3 results) No results for input(s): PROBNP in the last 8760 hours. HbA1C: No results for input(s): HGBA1C in the last 72 hours. CBG: Recent Labs  Lab 10/25/19 0656  GLUCAP 162*   Lipid Profile: No results for input(s): CHOL, HDL, LDLCALC, TRIG, CHOLHDL, LDLDIRECT in the last 72 hours. Thyroid Function Tests: No results for input(s): TSH, T4TOTAL, FREET4, T3FREE, THYROIDAB in the last 72 hours. Anemia Panel: No results for input(s): VITAMINB12, FOLATE, FERRITIN, TIBC, IRON, RETICCTPCT in the last 72 hours. Urine analysis:    Component Value Date/Time   COLORURINE AMBER (A) 03/27/2016 0810   APPEARANCEUR CLOUDY (A) 03/27/2016 0810   LABSPEC  1.015 03/27/2016 0810   PHURINE 5.5 03/27/2016 0810   GLUCOSEU NEGATIVE 03/27/2016 0810   HGBUR SMALL (A) 03/27/2016 0810   BILIRUBINUR SMALL (A) 03/27/2016 0810   KETONESUR 15 (A) 03/27/2016 0810   PROTEINUR >300 (A) 03/27/2016 0810   UROBILINOGEN 0.2 01/13/2015 0953   NITRITE NEGATIVE 03/27/2016 0810   LEUKOCYTESUR LARGE (A) 03/27/2016 0810   Sepsis  Labs: No results found for this or any previous visit (from the past 240 hour(s)).   Radiological Exams on Admission: Dg Chest Port 1 View  Result Date: 10/25/2019 CLINICAL DATA:  Shortness of breath. EXAM: PORTABLE CHEST 1 VIEW COMPARISON:  04/20/2015 FINDINGS: Enlarged cardiac silhouette with some improvement with an improved inspiration. Interval mild diffuse prominence of the interstitial markings. Normal vascularity. No pleural fluid. Progressive aortic calcification. Thoracic spine degenerative changes. IMPRESSION: 1. Interval mild interstitial pulmonary edema, pneumonitis or chronic interstitial lung disease. 2. Mildly improved cardiomegaly. 3. Progressive aortic atherosclerosis. Electronically Signed   By: Claudie Revering M.D.   On: 10/25/2019 07:56    EKG: Independently reviewed.  Sinus rhythm at 94 bpm with otherwise similar appearance to previous EKG from 2019.  QTc 451.  Assessment/Plan Acute respiratory failure with hypoxia secondary to COVID-19 infection: Patient presents with worsening shortness of breath.  Recently diagnosed with COVID-19 per report on 10/28.  Chest x-ray interstitial edema versus pneumonitis.  Initial O2 saturations reported to be as low as 80% on room air but improved with nasal cannula oxygen.  Patient currently maintaining O2 saturations on 2 L. -Admit to a progressive bed here at [facility] -Continuous pulse oximetry with nasal cannula oxygen to maintain O2 saturation greater than 90% -Check inflammatory markers stat -Albuterol inhaler -Dexamethasone 6 mg p.o. daily -Antitussives as needed -Vitamin C and  zinc -Continue Daily monitoring of inflammatory markers -Evaluate for need to start on remdesivir  NSTEMI, history of CAD: Acute.  Patient presents with high-sensitivity troponin of 3469.  EKG did not show any significant changes.  Patient was given full dose aspirin.  Previous history of anterior wall MI back in 2016. -Continue to trend troponins -Nitroglycerin as needed for chest -Continue Plavix and aspirin -Check echocardiogram  ESRD on HD: Patient last hemodialysis session is not currently known.  On admission sodium 130, potassium 5.1, BUN 81, and creatinine 10.74. Followed by Dr. Lamont Snowball of nephrology in the outpatient setting. -Continue current medications for secondary hyperparathyroidism -Dr. Jonnie Finner of nephrology formally consulted  Combined systolic and diastolic CHF: Acute on chronic.  Patient noted to have some aspect of heart failure likely based off chest x-ray.  Last EF noted to be 30-35% with grade 2 diastolic dysfunction back in 03/2015. -Strict I&Os -Daily weight -Follow-up BNP -Continue amiodarone  Essential hypertension: Patient's current blood pressures appear stable. -Continue amlodipine   Diabetes mellitus type 2 with hyperglycemia: On admission patient blood glucose elevated at 192.  Patient home regimen includes sliding scale of insulin.  Hemoglobin A1c last 5.5 on 04/23/2018. -Hypoglycemic protocol -CBGs every 6 hours with sensitive SSI  -Adjust insulin regimen as needed  Paranoia: Patient currently treated with Abilify for issues with paranoia. -Continue Abilify  Hyponatremia: Acute.  Patient sodium noted to be 130 on admission.  Suspect aspect of hypervolemic -Continue to monitor  Peripheral artery disease: Patient with previous history of amputation of the bilateral lower extremities.  Dyslipidemia: Last lipid panel noted total cholesterol 165, HDL 50, LDL 103, triglycerides 57 in 04/2018. -Check lipid panel in a.m. -Continue atorvastatin  Anemia  of chronic kidney disease: Hemoglobin 9.9 g/dL on admission.  No reports of bleeding. -Continue to monitor  DVT prophylaxis: heparin Code Status: Full  Family Communication: Discussed plan of care with the patient son over the phone. Disposition Plan: likely transfer back to skilled nursing facility if able to Consults called: Nephrology  Admission status: Inpatient  Norval Morton MD [facility] Pager [phone]  If 7PM-7AM, please contact night-coverage [email or web address] Password [email or web address]  10/25/2019, 10:33 AM

## 2019-10-25 NOTE — Progress Notes (Signed)
Inflammatory markers such as the procalcitonin were elevated.  Added on empiric antibiotics of Rocephin and azithromycin.  Check blood and sputum cultures.  Discussed case with pharmacy who we will evaluate for starting remdesivir.  Cardiology also was formally consulted for elevated troponins.  They recommend no additional intervention at this time.

## 2019-10-25 NOTE — ED Notes (Signed)
Spoke with [employer]. Pt is able to understand when you put a finger to her right hand and write each letter spelling words. Unable to speak with someone about her baseline.

## 2019-10-25 NOTE — Progress Notes (Addendum)
Pt required new PIV. HD on the way. ASL translator used. Pt able to understand basic signs in hand, and will communicate with head nodding, vocalizations, and signing. Pt also able to communicate by writing basic words.

## 2019-10-25 NOTE — ED Provider Notes (Signed)
I accepted care of this patient at change of shift from Dr. Roxanne Mins  This patient is a very unfortunate 63 year old female who has been blind and deaf since childhood, she has had progressive renal dysfunction and is currently on dialysis for her end-stage renal disease.  Her access is in the left upper extremity.  She was recently diagnosed with Covid, she presents today after having hypoxia and worsening condition at her nursing facility.  The patient is unable to give me any information, she does not talk, she on my exam appears to be tachypneic, she has slight abnormal lung sounds with rhonchi, she does appear to be having a slight increased work of breathing but no accessory muscle use.  She is not tachycardic, she does have a subtle murmur.  Review of the medical record shows that she did have an anterior wall myocardial infarction approximately 5 years ago, she subsequently had an echocardiogram showing 30 to 35%.  Today she presents with increasing shortness of breath, I am concerned in that her troponin is over 3000 consistent with a myocardial infarction, this is likely related to Covid but given her underlying coronary disease will consult with cardiology.  Labs show that the patient is chronically anemic, today s similar to May 2019, creatinine chronically elevated, mildly hyponatremic and mildly hyperglycemic.  The chest x-ray does show some mild interstitial pulmonary edema  I discussed the Care with Dr. Lovena Le of the cardiology service who agrees that this patient is not an interventional candidate but infact can be taken care of supportively as the MI is likely related to the undelrying Covid infection - the pt will need admission to the hospital,   I have discussed the care with family multiple times - nursing has contacted the [employer] sign language interpreter services to try to facilitate better commuinictaions  Hospitalist paged at 10:15 AM D/w Dr. Tamala Julian - will admit   .Critical  Care Performed by: Noemi Chapel, MD Authorized by: Noemi Chapel, MD   Critical care provider statement:    Critical care time (minutes):  35   Critical care time was exclusive of:  Separately billable procedures and treating other patients and teaching time   Critical care was necessary to treat or prevent imminent or life-threatening deterioration of the following conditions:  Cardiac failure   Critical care was time spent personally by me on the following activities:  Blood draw for specimens, development of treatment plan with patient or surrogate, discussions with consultants, evaluation of patient's response to treatment, examination of patient, obtaining history from patient or surrogate, ordering and performing treatments and interventions, ordering and review of laboratory studies, ordering and review of radiographic studies, pulse oximetry, re-evaluation of patient's condition and review of old charts    Final diagnoses:  COVID-19 virus infection  End-stage renal disease on hemodialysis ([place])  Pneumonia due to COVID-19 virus  NSTEMI (non-ST elevated myocardial infarction) ([place])      Noemi Chapel, MD 10/25/19 1049

## 2019-10-25 NOTE — ED Provider Notes (Signed)
The pt's son is POA, Phone number - [place].   I discussed the care with the patient's daughter who was able to give me information about her but is not the power of attorney, the healthcare power of attorney is the son, please see information above.  He states that the patient is a full code, they would want everything done including CPR and respiratory life support.   Noemi Chapel, MD 10/25/19 [phone]

## 2019-10-25 NOTE — Consult Note (Signed)
Cardiology Consultation:   Patient ID: [name] MRN: [id number]; DOB: 12/01/1956  Admit date: 10/25/2019 Date of Consult: 10/25/2019  Primary Care Provider: Patient, No Pcp Per Primary Cardiologist: No primary care provider on file. none Primary Electrophysiologist:  None none   Patient Profile:   [name] is a 63 y.o. female with a hx of ESRD on HD, CAD, s/p remote MI who is being seen today for the evaluation of elevated troponin at the request of Dr. Tamala [name].  History of Present Illness:   [name] sustained an anterior MI in 2016. She has renal failure and undergone HD under the direction of [name]. She was brought to the ED with worsening dyspnea in the setting of Covid infection. She has a troponin of 3000 but no angina and no new ECG changes. She has residual anterior infarction pattern. She has been placed on oxygen. She has improved with the supplemental oxygen. She is not volume overloaded on exam.   Heart Pathway Score:     Past Medical History:  Diagnosis Date   Anemia    Due to fibroids.   Anemia in CKD (chronic kidney disease) 12/14/2014   Benign hypertension with ESRD (end-stage renal disease) ([place]) 02/12/2018   Bilateral leg edema    a. Chronic.   Blind    Cardiac arrest ([place]) 12/12/2014   CHF (congestive heart failure) ([place])    Coronary artery disease    Deaf    USE SIGN INTERPRETER.  Divorced from husband but lives with him. Has daughter but she does not care for her.   Diabetes mellitus    a. Per PCP note 2012 (A1C 9.2) - pt unwilling to take meds and was educated on risk of uncontrolled DM.  b. A1C 5.9 in 11/2013.   ESRD on hemodialysis ([place])    Mon, Wed, Fri   Essential hypertension 08/09/2009   Qualifier: Diagnosis of  By: Sarita Haver  MD, Coralyn Mark     Fibroid uterus    Heart murmur    Hyperlipidemia    Hypertension    a. Has previously refused blood pressure meds.    Leiomyoma of uterus 08/09/2009   Qualifier: Diagnosis of   By: Sarita Haver  MD, Coralyn Mark     MI (myocardial infarction) ([address]) 11/2014   Moderate tricuspid regurgitation    Osteomyelitis ([place])    Psychiatric disorder    She frequently exhibits paranoia and has been diagnosed with psychotic d/o NOS during hospital stay in the past. She is tangetial and perseverative during her visits. She apparently has had a bad experience with mental health in [place] in the past and refuses to discuss mental issues for fear that she will be sent back there. Her paranoia, communication issues, financial woes and lack of fam   Pulmonary hypertension ([place])     Past Surgical History:  Procedure Laterality Date   AMPUTATION Bilateral 04/02/2015   Procedure: Amputation Bilateral Great Toes MTP Joint;  Surgeon: Newt Minion, MD;  Location: [facility];  Service: Orthopedics;  Laterality: Bilateral;   AMPUTATION Left 05/14/2015   Procedure: Left Transmetatarsal Amputation;  Surgeon: Newt Minion, MD;  Location: [facility];  Service: Orthopedics;  Laterality: Left;   AMPUTATION Right 08/20/2015   Procedure: Transmetatarsal Amputation Right Foot;  Surgeon: Newt Minion, MD;  Location: [place];  Service: Orthopedics;  Laterality: Right;   AV FISTULA PLACEMENT Left 01/02/2014   Procedure: ARTERIOVENOUS (AV) FISTULA CREATION;  Surgeon: Rosetta Posner, MD;  Location: [facility];  Service: Vascular;  Laterality: Left;   INSERTION OF DIALYSIS CATHETER Right 01/02/2014   Procedure: INSERTION OF DIALYSIS CATHETER;  Surgeon: Rosetta Posner, MD;  Location: [facility];  Service: Vascular;  Laterality: Right;   NO PAST SURGERIES     ORIF PATELLA Left 05/17/2015   Procedure: OPEN REDUCTION INTERNAL (ORIF) FIXATION PATELLA;  Surgeon: Newt Minion, MD;  Location: [place];  Service: Orthopedics;  Laterality: Left;     Home Medications:  Prior to Admission medications   Medication Sig Start Date End Date Taking? Authorizing Provider  amiodarone (PACERONE) 200 MG tablet Take 200 mg by mouth every evening.    Yes  [provider]  amLODipine (NORVASC) 5 MG tablet Take 5 mg by mouth daily.   Yes [provider]  ARIPiprazole (ABILIFY) 15 MG tablet Take 15 mg by mouth daily.  05/17/18  Yes [provider]  atorvastatin (LIPITOR) 10 MG tablet Take 10 mg by mouth every evening.   Yes [provider]  Cholecalciferol (VITAMIN D3) 50 MCG (2000 UT) capsule Take 2,000 Units by mouth daily.   Yes [provider]  clopidogrel (PLAVIX) 75 MG tablet Take 75 mg by mouth every evening.   Yes [provider]  Dextran 70-Hypromellose (ARTIFICIAL TEARS) 0.1-0.3 % SOLN Apply 1 drop to eye every 12 (twelve) hours as needed (dryness).    Yes [provider]  folic acid-vitamin b complex-vitamin c-selenium-zinc (DIALYVITE) 3 MG TABS tablet Take 1 tablet by mouth daily.   Yes [provider]  insulin lispro (ADMELOG) 100 UNIT/ML injection Inject 0-5 Units into the skin See admin instructions. Per Sliding Scale twice dialy: 0-150 0units 151-400 5units   Yes [provider]  nitroGLYCERIN (NITROSTAT) 0.4 MG SL tablet Place 0.4 mg under the tongue every 5 (five) minutes as needed for chest pain.   Yes [provider]  Nutritional Supplements (PROMOD) LIQD Take 60 mLs by mouth 2 (two) times daily. 05/07/18  Yes [provider]  ondansetron (ZOFRAN) 4 MG tablet Take 4 mg by mouth every 6 (six) hours as needed for nausea or vomiting.   Yes [provider]  polyethylene glycol (MIRALAX / GLYCOLAX) packet Take 17 g by mouth daily.    Yes [provider]  sevelamer (RENAGEL) 800 MG tablet Take 1,600 mg by mouth 3 (three) times daily with meals.  09/03/18  Yes [provider]  traMADol (ULTRAM) 50 MG tablet Take 1 tablet (50 mg total) by mouth daily. 10/14/18  Yes [name], DO    Inpatient Medications: Scheduled Meds:  albuterol  2 puff Inhalation Q6H   amiodarone  200 mg Oral QPM   amLODipine  5 mg Oral  Daily   ARIPiprazole  15 mg Oral Daily   atorvastatin  10 mg Oral QPM   Chlorhexidine Gluconate Cloth  6 each Topical Q0600   clopidogrel  75 mg Oral QPM   dexamethasone  6 mg Oral Q24H   heparin  5,000 Units Subcutaneous Q8H   insulin aspart  0-9 Units Subcutaneous Q4H   ProMod  60 mL Oral BID   sevelamer carbonate  1,600 mg Oral TID WC   sodium chloride flush  3 mL Intravenous Q12H   vitamin C  500 mg Oral Daily   zinc sulfate  220 mg Oral Daily   Continuous Infusions:  azithromycin     cefTRIAXone (ROCEPHIN)  IV     [START ON 10/26/2019] famotidine (PEPCID) IV     PRN Meds:  acetaminophen, chlorpheniramine-HYDROcodone, guaiFENesin-dextromethorphan, nitroGLYCERIN, ondansetron **OR** ondansetron (ZOFRAN) IV, polyethylene glycol, polyvinyl alcohol  Allergies:   No Known Allergies  Social History:   Social History   Socioeconomic History   Marital status: Widowed    Spouse name: Not on file   Number of children: Not on file   Years of education: Not on file   Highest education level: Not on file  Occupational History   Not on file  Social Needs   Financial resource strain: Not on file   Food insecurity    Worry: Not on file    Inability: Not on file   Transportation needs    Medical: Not on file    Non-medical: Not on file  Tobacco Use   Smoking status: Never Smoker   Smokeless tobacco: Never Used   Tobacco comment: Prior dip  Substance and Sexual Activity   Alcohol use: No   Drug use: No   Sexual activity: Not on file  Lifestyle   Physical activity    Days per week: Not on file    Minutes per session: Not on file   Stress: Not on file  Relationships   Social connections    Talks on phone: Not on file    Gets together: Not on file    Attends religious service: Not on file    Active member of club or organization: Not on file    Attends meetings of clubs or organizations: Not on file    Relationship status: Not on file    Intimate partner violence    Fear of current or ex partner: Not on file    Emotionally abused: Not on file    Physically abused: Not on file    Forced sexual activity: Not on file  Other Topics Concern   Not on file  Social History Narrative   Not on file    Family History:    Family History  Problem Relation Age of Onset   Other Father        Drowned from fishing?   Heart disease Other      ROS:  Please see the history of present illness.   All other ROS reviewed and negative.     Physical Exam/Data:   Vitals:   10/25/19 1445 10/25/19 1500 10/25/19 1515 10/25/19 1530  BP: 129/65 125/63 125/67 (!) 147/70  Pulse: 84 83 82 91  Resp: 19 (!) 23 (!) 21 18  Temp:      TempSrc:      SpO2: 99% 100% 98% 100%   No intake or output data in the 24 hours ending 10/25/19 1650 Last 3 Weights 09/25/2018 08/29/2018 08/08/2018  Weight (lbs) 147 lb 8 oz 152 lb 9.6 oz 148 lb  Weight (kg) 66.906 kg 69.219 kg 67.132 kg     There is no height or weight on file to calculate BMI.  General:  Well nourished, well developed, in no acute distress HEENT: blind Lymph: no adenopathy Neck: 7cm JVD Endocrine:  No thryomegaly Vascular: No carotid bruits; FA pulses 2+ bilaterally without bruits  Cardiac:  normal S1, S2; RRR; no murmur  Lungs:  clear to auscultation bilaterally, no wheezing, rhonchi or rales  Abd: soft, nontender, no hepatomegaly  Ext: no edema Musculoskeletal:  No deformities, BUE and BLE strength normal and equal Skin: warm and dry  Neuro:  CNs 2-12 intact, no focal abnormalities noted Psych:  Normal affect   EKG:  The EKG was personally reviewed and demonstrates:  nsr with  old anterior MI Telemetry:  Telemetry was personally reviewed and demonstrates:  nsr  Relevant CV Studies: none  Laboratory Data:  High Sensitivity Troponin:   Recent Labs  Lab 10/25/19 0641 10/25/19 1159  TROPONINIHS 3,469* 2,867*     Chemistry Recent Labs  Lab 10/25/19 0641  NA 130*  K  5.1  CL 86*  CO2 24  GLUCOSE 192*  BUN 81*  CREATININE 10.74*  CALCIUM 7.8*  GFRNONAA 3*  GFRAA 4*  ANIONGAP 20*    Recent Labs  Lab 10/25/19 1159  PROT 7.4  ALBUMIN 3.4*  AST 31  ALT 20  ALKPHOS 94  BILITOT 0.7   Hematology Recent Labs  Lab 10/25/19 0641  WBC 8.7  RBC 3.20*  HGB 9.9*  HCT 30.6*  MCV 95.6  MCH 30.9  MCHC 32.4  RDW 15.6*  PLT 241   BNP Recent Labs  Lab 10/25/19 1202  BNP 1,329.5*    DDimer  Recent Labs  Lab 10/25/19 1159  DDIMER 1.23*     Radiology/Studies:  Dg Chest Port 1 View  Result Date: 10/25/2019 CLINICAL DATA:  Shortness of breath. EXAM: PORTABLE CHEST 1 VIEW COMPARISON:  04/20/2015 FINDINGS: Enlarged cardiac silhouette with some improvement with an improved inspiration. Interval mild diffuse prominence of the interstitial markings. Normal vascularity. No pleural fluid. Progressive aortic calcification. Thoracic spine degenerative changes. IMPRESSION: 1. Interval mild interstitial pulmonary edema, pneumonitis or chronic interstitial lung disease. 2. Mildly improved cardiomegaly. 3. Progressive aortic atherosclerosis. Electronically Signed   By: Claudie Revering M.D.   On: 10/25/2019 07:56    Assessment and Plan:   1. NSTEMI - the patient has a troponin elevation in the setting of acute Covid infection. She does not have symptoms of either pericarditis or acute ischemia. I would check a 2D echo and if no effusion, consider IV heparin without bolus. She is a poor candidate for invasive treatment.  2. Hypoxemia - this is much improved. Her CXR has been reviewed. She will be treated emperically for pneumonia. 3. Covid infection - I will defer decision on steroids and anti-biotics to her primary team.       For questions or updates, please contact [place] Please consult [email or web address] for contact info under     Signed, Cristopher Peru, MD  10/25/2019 4:50 PM

## 2019-10-25 NOTE — ED Triage Notes (Signed)
Pt arrived BIB EMS from [place] Tested positive for COVID-19 on the 26th At baseline cannot speak and is blind

## 2019-10-25 NOTE — ED Notes (Signed)
ED TO INPATIENT HANDOFF REPORT  ED Nurse Name and Phone #: William Hamburger, RN [phone]  S Name/Age/Gender [name] 63 y.o. female Room/Bed: 026C/026C  Code Status   Code Status: Full Code  Home/SNF/Other Colony Patient oriented to: self Is this baseline? 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  Admit to Progressive based on following criteria: CARDIOVASCULAR & THORACIC of moderate stability with acute coronary syndrome symptoms/low risk myocardial infarction/hypertensive urgency/arrhythmias/heart failure potentially compromising stability and stable post cardiovascular intervention patients.  Covid Evaluation: Confirmed COVID Positive  Diagnosis: COVID-19 virus infection [[id number]]  Admitting Physician: Norval Morton [[id number]]  Attending Physician: Norval Morton [[id number]]  Estimated length of stay: past midnight tomorrow  Certification:: I certify this patient will need inpatient services for at least 2 midnights  PT Class (Do Not Modify): Inpatient [101]  PT Acc Code (Do Not Modify): Private [1]       B Medical/Surgery History Past Medical History:  Diagnosis Date  . Anemia    Due to fibroids.  . Anemia in CKD (chronic kidney disease) 12/14/2014  . Benign hypertension with ESRD  (end-stage renal disease) ([place]) 02/12/2018  . Bilateral leg edema    a. Chronic.  Marland Kitchen Blind   . Cardiac arrest ([place]) 12/12/2014  . CHF (congestive heart failure) ([place])   . Coronary artery disease   . Deaf    USE SIGN INTERPRETER.  Divorced from husband but lives with him. Has daughter but she does not care for her.  . Diabetes mellitus    a. Per PCP note 2012 (A1C 9.2) - pt unwilling to take meds and was educated on risk of uncontrolled DM.  b. A1C 5.9 in 11/2013.  Marland Kitchen ESRD on hemodialysis ([place])    Mon, Wed, Fri  . Essential hypertension 08/09/2009   Qualifier: Diagnosis of  By: Sarita Haver  MD, Coralyn Mark    . Fibroid uterus   . Heart murmur   . Hyperlipidemia   . Hypertension    a. Has previously refused blood pressure meds.   . Leiomyoma of uterus 08/09/2009   Qualifier: Diagnosis of  By: Sarita Haver  MD, Coralyn Mark    . MI (myocardial infarction) ([place]) 11/2014  . Moderate tricuspid regurgitation   . Osteomyelitis ([place])   . Psychiatric disorder    She frequently exhibits paranoia and has been diagnosed with psychotic d/o NOS during hospital stay in the past. She is tangetial and perseverative during her visits. She apparently has had a bad experience with mental health in [place] in the past and refuses to discuss mental issues for fear that she will be sent back there. Her paranoia, communication issues, financial woes and lack of fam  . Pulmonary hypertension ([place])    Past Surgical History:  Procedure Laterality Date  . AMPUTATION Bilateral 04/02/2015   Procedure: Amputation Bilateral Great Toes MTP Joint;  Surgeon: Newt Minion, MD;  Location: [facility];  Service: Orthopedics;  Laterality: Bilateral;  . AMPUTATION Left 05/14/2015   Procedure: Left Transmetatarsal Amputation;  Surgeon: Newt Minion, MD;  Location: [facility];  Service: Orthopedics;  Laterality: Left;  . AMPUTATION Right 08/20/2015   Procedure: Transmetatarsal Amputation Right Foot;  Surgeon: Newt Minion, MD;  Location: [facility];  Service:  Orthopedics;  Laterality: Right;  . AV FISTULA PLACEMENT Left 01/02/2014   Procedure: ARTERIOVENOUS (AV) FISTULA CREATION;  Surgeon: Rosetta Posner, MD;  Location: [facility];  Service: Vascular;  Laterality: Left;  . INSERTION OF DIALYSIS CATHETER Right 01/02/2014   Procedure: INSERTION OF DIALYSIS CATHETER;  Surgeon: Rosetta Posner, MD;  Location: [facility];  Service: Vascular;  Laterality: Right;  . NO PAST SURGERIES    . ORIF PATELLA Left 05/17/2015   Procedure: OPEN REDUCTION INTERNAL (ORIF) FIXATION PATELLA;  Surgeon: Newt Minion, MD;  Location: [facility];  Service: Orthopedics;  Laterality: Left;     A IV Location/Drains/Wounds Patient Lines/Drains/Airways Status   Active Line/Drains/Airways    Name:   Placement date:   Placement time:   Site:   Days:   Peripheral IV 10/25/19 Anterior;Right Forearm   10/25/19    0659    Forearm   less than 1   Fistula / Graft Left Upper arm Arteriovenous fistula   01/02/14    1245    Upper arm   2122   Fistula / Graft Left Upper arm Arteriovenous vein graft   -    -    Upper arm      Hemodialysis Catheter Right   01/02/14    1155    Internal jugular   2122   Incision 01/02/14 Arm Left   01/02/14    1310     2122   Incision 01/02/14 Chest Right   01/02/14    1310     2122   Incision 01/02/14 Chest Right   01/02/14    1318     2122   Incision (Closed) 04/02/15 Foot Left   04/02/15    1517     1667   Incision (Closed) 04/02/15 Foot Right   04/02/15    1517     1667   Incision (Closed) 05/14/15 Foot Left   05/14/15    1432     1625   Incision (Closed) 05/17/15 Knee Left   05/17/15    1927     1622   Incision (Closed) 08/20/15 Foot Right   08/20/15    1348     1527   Pressure Ulcer 11/22/14 Stage II -  Partial thickness loss of dermis presenting as a shallow open ulcer with a red, pink wound bed without slough. small tear in upper part of crack above anus on pt's buttocks   11/22/14    0024     1798   Pressure Ulcer 12/12/14 Stage I -  Intact skin with non-blanchable  redness of a localized area usually over a bony prominence.   12/12/14    0300     1778   Wound / Incision (Open or Dehisced) 01/13/15 Sacrum Partial thickness fissure R/T constant moisture in gluteal cleft.  This is NOT a pressure ulcer.   01/13/15    -    Sacrum   1746          Intake/Output Last 24 hours No intake or output data in the 24 hours ending 10/25/19 1957  Labs/Imaging  Results for orders placed or performed during the hospital encounter of 10/25/19 (from the past 48 hour(s))  CBC with Differential     Status: Abnormal   Collection Time: 10/25/19  6:41 AM  Result Value Ref Range   WBC 8.7 4.0 - 10.5 K/uL   RBC 3.20 (L) 3.87 - 5.11 MIL/uL   Hemoglobin 9.9 (L) 12.0 - 15.0 g/dL   HCT 30.6 (L) 36.0 - 46.0 %   MCV 95.6 80.0 - 100.0 fL   MCH 30.9 26.0 - 34.0 pg   MCHC 32.4 30.0 - 36.0 g/dL   RDW 15.6 (H) 11.5 - 15.5 %   Platelets 241 150 - 400 K/uL   nRBC 0.0 0.0 - 0.2 %   Neutrophils Relative % 77 %   Neutro Abs 6.7 1.7 - 7.7 K/uL   Lymphocytes Relative 13 %   Lymphs Abs 1.2 0.7 - 4.0 K/uL   Monocytes Relative 8 %   Monocytes Absolute 0.7 0.1 - 1.0 K/uL   Eosinophils Relative 0 %   Eosinophils Absolute 0.0 0.0 - 0.5 K/uL   Basophils Relative 1 %   Basophils Absolute 0.0 0.0 - 0.1 K/uL   Immature Granulocytes 1 %   Abs Immature Granulocytes 0.04 0.00 - 0.07 K/uL    Comment: Performed at [facility], [address], [place], [place] [address]  Basic metabolic panel     Status: Abnormal   Collection Time: 10/25/19  6:41 AM  Result Value Ref Range   Sodium 130 (L) 135 - 145 mmol/L   Potassium 5.1 3.5 - 5.1 mmol/L   Chloride 86 (L) 98 - 111 mmol/L   CO2 24 22 - 32 mmol/L   Glucose, Bld 192 (H) 70 - 99 mg/dL   BUN 81 (H) 8 - 23 mg/dL   Creatinine, Ser 10.74 (H) 0.44 - 1.00 mg/dL   Calcium 7.8 (L) 8.9 - 10.3 mg/dL   GFR calc non Af Amer 3 (L) >60 mL/min   GFR calc Af Amer 4 (L) >60 mL/min   Anion gap 20 (H) 5 - 15    Comment: Performed at [facility] [address], [place], [place] [address]  Troponin I (High Sensitivity)     Status: Abnormal   Collection Time: 10/25/19  6:41 AM  Result Value Ref Range   Troponin I (High Sensitivity) 3,469 (HH) <18 ng/L    Comment: CRITICAL RESULT CALLED TO, READ BACK BY AND VERIFIED WITH: M.BARBER RN @ [phone] 10/25/2019 BY C.EDENS (NOTE) Elevated high sensitivity troponin I (hsTnI) values and significant  changes across serial measurements may suggest ACS but many other  chronic and acute conditions are known to elevate hsTnI results.  Refer to the Links section for chest pain algorithms and additional  guidance. Performed at [facility], [place] [address] [place], [place] [address]   CBG monitoring, ED     Status: Abnormal   Collection Time: 10/25/19  6:56 AM  Result Value Ref Range   Glucose-Capillary 162 (H) 70 - 99 mg/dL  HIV Antibody (routine testing w rflx)     Status: None   Collection Time: 10/25/19 11:59 AM  Result Value Ref Range   HIV Screen 4th Generation wRfx NON REACTIVE NON REACTIVE    Comment: Performed at [facility], [place] [address] [place],  [address]  C-reactive protein     Status: None   Collection Time: 10/25/19 11:59 AM  Result Value Ref Range   CRP <  0.8 <1.0 mg/dL    Comment: Performed at [facility], [place] [address], [place], [place] [address]  Ferritin     Status: Abnormal   Collection Time: 10/25/19 11:59 AM  Result Value Ref Range   Ferritin 984 (H) 11 - 307 ng/mL    Comment: Performed at [facility], [place] [address] [place], [place] [address]  D-dimer, quantitative (not at [facility])     Status: Abnormal   Collection Time: 10/25/19 11:59 AM  Result Value Ref Range   D-Dimer, Quant 1.23 (H) 0.00 - 0.50 ug/mL-FEU    Comment: (NOTE) At the manufacturer cut-off of 0.50 ug/mL FEU, this assay has been documented to exclude PE with a sensitivity and negative predictive value of 97 to 99%.  At this time, this assay has not been approved by  the FDA to exclude DVT/VTE. Results should be correlated with clinical presentation. Performed at [facility], [place] [address] [place], [place] [address]   Fibrinogen     Status: None   Collection Time: 10/25/19 11:59 AM  Result Value Ref Range   Fibrinogen 365 210 - 475 mg/dL    Comment: Performed at [facility] [address] [place], [place] [address]  Troponin I (High Sensitivity)     Status: Abnormal   Collection Time: 10/25/19 11:59 AM  Result Value Ref Range   Troponin I (High Sensitivity) 2,867 (HH) <18 ng/L    Comment: CRITICAL VALUE NOTED.  VALUE IS CONSISTENT WITH PREVIOUSLY REPORTED AND CALLED VALUE. (NOTE) Elevated high sensitivity troponin I (hsTnI) values and significant  changes across serial measurements may suggest ACS but many other  chronic and acute conditions are known to elevate hsTnI results.  Refer to the Links section for chest pain algorithms and additional  guidance. Performed at [facility], [place] [address], [place], [place] [address]   Procalcitonin     Status: None   Collection Time: 10/25/19 11:59 AM  Result Value Ref Range   Procalcitonin 2.93 ng/mL    Comment:        Interpretation: PCT > 2 ng/mL: Systemic infection (sepsis) is likely, unless other causes are known. (NOTE)       Sepsis PCT Algorithm           Lower Respiratory Tract                                      Infection PCT Algorithm    ----------------------------     ----------------------------         PCT < 0.25 ng/mL                PCT < 0.10 ng/mL         Strongly encourage             Strongly discourage   discontinuation of antibiotics    initiation of antibiotics    ----------------------------     -----------------------------       PCT 0.25 - 0.50 ng/mL            PCT 0.10 - 0.25 ng/mL               OR       >80% decrease in PCT            Discourage initiation of  antibiotics      Encourage  discontinuation           of antibiotics    ----------------------------     -----------------------------         PCT >= 0.50 ng/mL              PCT 0.26 - 0.50 ng/mL               AND       <80% decrease in PCT              Encourage initiation of                                             antibiotics       Encourage continuation           of antibiotics    ----------------------------     -----------------------------        PCT >= 0.50 ng/mL                  PCT > 0.50 ng/mL               AND         increase in PCT                  Strongly encourage                                      initiation of antibiotics    Strongly encourage escalation           of antibiotics                                     -----------------------------                                           PCT <= 0.25 ng/mL                                                 OR                                        > 80% decrease in PCT                                     Discontinue / Do not initiate                                             antibiotics Performed at [facility], [place] [address], [place], [place] [address]   Lactate dehydrogenase     Status: Abnormal   Collection Time: 10/25/19  11:59 AM  Result Value Ref Range   LDH 283 (H) 98 - 192 U/L    Comment: Performed at [facility] [address] [place], [place] [address]  Hepatitis B surface antigen     Status: None   Collection Time: 10/25/19 11:59 AM  Result Value Ref Range   Hepatitis B Surface Ag NON REACTIVE NON REACTIVE    Comment: Performed at [facility] [address], [place], [place] [address]  Hepatic function panel     Status: Abnormal   Collection Time: 10/25/19 11:59 AM  Result Value Ref Range   Total Protein 7.4 6.5 - 8.1 g/dL   Albumin 3.4 (L) 3.5 - 5.0 g/dL   AST 31 15 - 41 U/L   ALT 20 0 - 44 U/L   Alkaline Phosphatase 94 38 - 126 U/L   Total Bilirubin 0.7 0.3 - 1.2 mg/dL   Bilirubin, Direct 0.1  0.0 - 0.2 mg/dL   Indirect Bilirubin 0.6 0.3 - 0.9 mg/dL    Comment: Performed at [facility] [address], [place], [place] [address]  Brain natriuretic peptide     Status: Abnormal   Collection Time: 10/25/19 12:02 PM  Result Value Ref Range   B Natriuretic Peptide 1,329.5 (H) 0.0 - 100.0 pg/mL    Comment: Performed at [facility] [address] [place], [place] [address]  CBG monitoring, ED     Status: Abnormal   Collection Time: 10/25/19  2:35 PM  Result Value Ref Range   Glucose-Capillary 129 (H) 70 - 99 mg/dL   Comment 1 Notify RN    Comment 2 Document in Chart   Troponin I (High Sensitivity)     Status: Abnormal   Collection Time: 10/25/19  4:57 PM  Result Value Ref Range   Troponin I (High Sensitivity) 2,802 (HH) <18 ng/L    Comment: CRITICAL VALUE NOTED.  VALUE IS CONSISTENT WITH PREVIOUSLY REPORTED AND CALLED VALUE. (NOTE) Elevated high sensitivity troponin I (hsTnI) values and significant  changes across serial measurements may suggest ACS but many other  chronic and acute conditions are known to elevate hsTnI results.  Refer to the Links section for chest pain algorithms and additional  guidance. Performed at [facility], [place] [address], [place], [place] [address]   Culture, blood (routine x 2)     Status: None (Preliminary result)   Collection Time: 10/25/19  5:05 PM   Specimen: BLOOD RIGHT FOREARM  Result Value Ref Range   Specimen Description BLOOD RIGHT FOREARM    Special Requests      BOTTLES DRAWN AEROBIC AND ANAEROBIC Blood Culture adequate volume   Culture      NO GROWTH <12 HOURS Performed at [facility], [place] [address], [place],  [address]    Report Status PENDING   CBG monitoring, ED     Status: Abnormal   Collection Time: 10/25/19  5:07 PM  Result Value Ref Range   Glucose-Capillary 139 (H) 70 - 99 mg/dL   Comment 1 Notify RN   Culture, blood (routine x 2)     Status: None (Preliminary result)   Collection  Time: 10/25/19  5:14 PM   Specimen: BLOOD RIGHT HAND  Result Value Ref Range   Specimen Description BLOOD RIGHT HAND    Special Requests      BOTTLES DRAWN AEROBIC AND ANAEROBIC Blood Culture adequate volume   Culture      NO  GROWTH <12 HOURS Performed at [facility], [place] [address], [place], [place] [address]    Report Status PENDING    Dg Chest Port 1 View  Result Date: 10/25/2019 CLINICAL DATA:  Shortness of breath. EXAM: PORTABLE CHEST 1 VIEW COMPARISON:  04/20/2015 FINDINGS: Enlarged cardiac silhouette with some improvement with an improved inspiration. Interval mild diffuse prominence of the interstitial markings. Normal vascularity. No pleural fluid. Progressive aortic calcification. Thoracic spine degenerative changes. IMPRESSION: 1. Interval mild interstitial pulmonary edema, pneumonitis or chronic interstitial lung disease. 2. Mildly improved cardiomegaly. 3. Progressive aortic atherosclerosis. Electronically Signed   By: Claudie Revering M.D.   On: 10/25/2019 07:56    Pending Labs Unresulted Labs (From admission, onward)    Start     Ordered   10/26/19 0500  CBC with Differential/Platelet  Daily,   R     10/25/19 1109   10/26/19 0500  Comprehensive metabolic panel  Daily,   R     10/25/19 1109   10/26/19 0500  C-reactive protein  Daily,   R     10/25/19 1109   10/26/19 0500  D-dimer, quantitative (not at [facility])  Daily,   R     10/25/19 1109   10/26/19 0500  Ferritin  Daily,   R     10/25/19 1109   10/26/19 0500  Magnesium  Daily,   R     10/25/19 1109   10/26/19 0500  Phosphorus  Daily,   R     10/25/19 1109   10/26/19 0500  Lipid panel  Tomorrow morning,   R     10/25/19 1448   10/25/19 1555  Expectorated sputum assessment w rflx to resp cult  ONCE - STAT,   R     10/25/19 1554          Vitals/Pain Today's Vitals   10/25/19 1445 10/25/19 1500 10/25/19 1515 10/25/19 1530  BP: 129/65 125/63 125/67 (!) 147/70  Pulse: 84 83 82 91  Resp: 19 (!) 23 (!) 21 18   Temp:      TempSrc:      SpO2: 99% 100% 98% 100%    Isolation Precautions Airborne and Contact precautions  Medications Medications  heparin injection 5,000 Units (5,000 Units Subcutaneous Given 10/25/19 1535)  sodium chloride flush (NS) 0.9 % injection 3 mL (3 mLs Intravenous Not Given 10/25/19 1209)  albuterol (VENTOLIN HFA) 108 (90 Base) MCG/ACT inhaler 2 puff (2 puffs Inhalation Given 10/25/19 1312)  dexamethasone (DECADRON) tablet 6 mg (6 mg Oral Given 10/25/19 1307)  guaiFENesin-dextromethorphan (ROBITUSSIN DM) 100-10 MG/5ML syrup 10 mL (has no administration in time range)  chlorpheniramine-HYDROcodone (TUSSIONEX) 10-8 MG/5ML suspension 5 mL (has no administration in time range)  vitamin C (ASCORBIC ACID) tablet 500 mg (has no administration in time range)  zinc sulfate capsule 220 mg (220 mg Oral Given 10/25/19 1310)  acetaminophen (TYLENOL) tablet 650 mg (has no administration in time range)  ondansetron (ZOFRAN) tablet 4 mg (has no administration in time range)    Or  ondansetron (ZOFRAN) injection 4 mg (has no administration in time range)  insulin aspart (novoLOG) injection 0-9 Units (1 Units Subcutaneous Given 10/25/19 1526)  atorvastatin (LIPITOR) tablet 10 mg (10 mg Oral Given 10/25/19 1926)  amLODipine (NORVASC) tablet 5 mg (5 mg Oral Given 10/25/19 1307)  amiodarone (PACERONE) tablet 200 mg (200 mg Oral Given 10/25/19 1925)  nitroGLYCERIN (NITROSTAT) SL tablet 0.4 mg (has no administration in time range)  ARIPiprazole (ABILIFY) tablet 15 mg (  15 mg Oral Given 10/25/19 1310)  sevelamer carbonate (RENVELA) tablet 1,600 mg (1,600 mg Oral Given 10/25/19 1305)  polyethylene glycol (MIRALAX / GLYCOLAX) packet 17 g (has no administration in time range)  clopidogrel (PLAVIX) tablet 75 mg (75 mg Oral Given 10/25/19 1926)  ProMod LIQD 60 mL (has no administration in time range)  Chlorhexidine Gluconate Cloth 2 % PADS 6 each (has no administration in time range)  polyvinyl  alcohol (LIQUIFILM TEARS) 1.4 % ophthalmic solution 1 drop (1 drop Both Eyes Given 10/25/19 1313)  famotidine (PEPCID) IVPB 20 mg premix (has no administration in time range)  cefTRIAXone (ROCEPHIN) 1 g in sodium chloride 0.9 % 100 mL IVPB (has no administration in time range)  azithromycin (ZITHROMAX) 500 mg in sodium chloride 0.9 % 250 mL IVPB (500 mg Intravenous New Bag/Given 10/25/19 1801)  remdesivir 200 mg in sodium chloride 0.9 % 250 mL IVPB (has no administration in time range)    Followed by  remdesivir 100 mg in sodium chloride 0.9 % 250 mL IVPB (has no administration in time range)  albuterol (VENTOLIN HFA) 108 (90 Base) MCG/ACT inhaler 2 puff (2 puffs Inhalation Given 10/25/19 0904)  aspirin chewable tablet 324 mg (324 mg Oral Given 10/25/19 1045)    Mobility non-ambulatory High fall risk   Focused Assessments Pulmonary Assessment Handoff:  Lung sounds: Bilateral Breath Sounds: Coarse crackles L Breath Sounds: Coarse crackles R Breath Sounds: Coarse crackles O2 Device: Nasal Cannula O2 Flow Rate (L/min): 3 L/min      R Recommendations: See Admitting Provider Note  Report given to:   Additional Notes:

## 2019-10-26 ENCOUNTER — Inpatient Hospital Stay (HOSPITAL_COMMUNITY): Payer: Medicare Other

## 2019-10-26 DIAGNOSIS — I214 Non-ST elevation (NSTEMI) myocardial infarction: Secondary | ICD-10-CM

## 2019-10-26 DIAGNOSIS — N186 End stage renal disease: Secondary | ICD-10-CM | POA: Diagnosis not present

## 2019-10-26 DIAGNOSIS — E1129 Type 2 diabetes mellitus with other diabetic kidney complication: Secondary | ICD-10-CM | POA: Diagnosis not present

## 2019-10-26 DIAGNOSIS — Z992 Dependence on renal dialysis: Secondary | ICD-10-CM | POA: Diagnosis not present

## 2019-10-26 LAB — CBC WITH DIFFERENTIAL/PLATELET
Abs Immature Granulocytes: 0.02 10*3/uL (ref 0.00–0.07)
Basophils Absolute: 0 10*3/uL (ref 0.0–0.1)
Basophils Relative: 0 %
Eosinophils Absolute: 0 10*3/uL (ref 0.0–0.5)
Eosinophils Relative: 0 %
HCT: 28.1 % — ABNORMAL LOW (ref 36.0–46.0)
Hemoglobin: 9.2 g/dL — ABNORMAL LOW (ref 12.0–15.0)
Immature Granulocytes: 1 %
Lymphocytes Relative: 9 %
Lymphs Abs: 0.4 10*3/uL — ABNORMAL LOW (ref 0.7–4.0)
MCH: 30.7 pg (ref 26.0–34.0)
MCHC: 32.7 g/dL (ref 30.0–36.0)
MCV: 93.7 fL (ref 80.0–100.0)
Monocytes Absolute: 0.4 10*3/uL (ref 0.1–1.0)
Monocytes Relative: 8 %
Neutro Abs: 3.6 10*3/uL (ref 1.7–7.7)
Neutrophils Relative %: 82 %
Platelets: 210 10*3/uL (ref 150–400)
RBC: 3 MIL/uL — ABNORMAL LOW (ref 3.87–5.11)
RDW: 15.8 % — ABNORMAL HIGH (ref 11.5–15.5)
WBC: 4.4 10*3/uL (ref 4.0–10.5)
nRBC: 0 % (ref 0.0–0.2)

## 2019-10-26 LAB — COMPREHENSIVE METABOLIC PANEL
ALT: 16 U/L (ref 0–44)
AST: 22 U/L (ref 15–41)
Albumin: 3.5 g/dL (ref 3.5–5.0)
Alkaline Phosphatase: 90 U/L (ref 38–126)
Anion gap: 16 — ABNORMAL HIGH (ref 5–15)
BUN: 43 mg/dL — ABNORMAL HIGH (ref 8–23)
CO2: 25 mmol/L (ref 22–32)
Calcium: 8 mg/dL — ABNORMAL LOW (ref 8.9–10.3)
Chloride: 95 mmol/L — ABNORMAL LOW (ref 98–111)
Creatinine, Ser: 7.11 mg/dL — ABNORMAL HIGH (ref 0.44–1.00)
GFR calc Af Amer: 7 mL/min — ABNORMAL LOW (ref >60)
GFR calc non Af Amer: 6 mL/min — ABNORMAL LOW (ref >60)
Glucose, Bld: 147 mg/dL — ABNORMAL HIGH (ref 70–99)
Potassium: 4.6 mmol/L (ref 3.5–5.1)
Sodium: 136 mmol/L (ref 135–145)
Total Bilirubin: 0.5 mg/dL (ref 0.3–1.2)
Total Protein: 7.7 g/dL (ref 6.5–8.1)

## 2019-10-26 LAB — GLUCOSE, CAPILLARY
Glucose-Capillary: 168 mg/dL — ABNORMAL HIGH (ref 70–99)
Glucose-Capillary: 96 mg/dL (ref 70–99)
Glucose-Capillary: 149 mg/dL — ABNORMAL HIGH (ref 70–99)
Glucose-Capillary: 114 mg/dL — ABNORMAL HIGH (ref 70–99)
Glucose-Capillary: 186 mg/dL — ABNORMAL HIGH (ref 70–99)

## 2019-10-26 LAB — MAGNESIUM: Magnesium: 2.6 mg/dL — ABNORMAL HIGH (ref 1.7–2.4)

## 2019-10-26 LAB — LIPID PANEL
Cholesterol: 126 mg/dL (ref 0–200)
HDL: 49 mg/dL (ref >40)
LDL Cholesterol: 70 mg/dL (ref 0–99)
Total CHOL/HDL Ratio: 2.6 RATIO
Triglycerides: 35 mg/dL (ref <150)
VLDL: 7 mg/dL (ref 0–40)

## 2019-10-26 LAB — HEMOGLOBIN A1C
Hgb A1c MFr Bld: 5.4 % (ref 4.8–5.6)
Mean Plasma Glucose: 108.28 mg/dL

## 2019-10-26 LAB — ECHOCARDIOGRAM LIMITED: Weight: 3326.3 oz

## 2019-10-26 LAB — D-DIMER, QUANTITATIVE: D-Dimer, Quant: 1 ug/mL-FEU — ABNORMAL HIGH (ref 0.00–0.50)

## 2019-10-26 LAB — C-REACTIVE PROTEIN: CRP: 1.9 mg/dL — ABNORMAL HIGH (ref <1.0)

## 2019-10-26 LAB — PHOSPHORUS: Phosphorus: 4.9 mg/dL — ABNORMAL HIGH (ref 2.5–4.6)

## 2019-10-26 LAB — PROCALCITONIN: Procalcitonin: 10.89 ng/mL

## 2019-10-26 LAB — FERRITIN: Ferritin: 1138 ng/mL — ABNORMAL HIGH (ref 11–307)

## 2019-10-26 LAB — MRSA PCR SCREENING: MRSA by PCR: NEGATIVE

## 2019-10-26 MED ORDER — PERFLUTREN LIPID MICROSPHERE
1.0000 mL | INTRAVENOUS | Status: AC | PRN
  Administered 2019-10-26: 4 mL via INTRAVENOUS
  Filled 2019-10-26: qty 10

## 2019-10-26 MED ORDER — HEPARIN SODIUM (PORCINE) 1000 UNIT/ML DIALYSIS: 9000.0000 [IU] | Freq: Once | INTRAMUSCULAR | Status: DC

## 2019-10-26 NOTE — Progress Notes (Signed)
Pt's son and daughter were dialed via phone. Neither answered. Not able to leave message.

## 2019-10-26 NOTE — Progress Notes (Signed)
Patient ID: [name], female   DOB: 09/20/1956, 64 y.o.   MRN: [id number] Cardiology  I have reviewed the results of her echo which demonstrates severe LV dysfunction and old anterior/apical/septal MI. As bp allows, would treat with beta blocker (toprol) instead of amlodipine. No indication for ACE/ARB in setting of ESRD. Her elevated troponin is due to the stress of Covid/Pneumonia and would not work up further.  Mikle [place]worth.D.

## 2019-10-26 NOTE — Progress Notes (Addendum)
PROGRESS NOTE  [name] SHF:[id number] DOB: 11/18/1956 DOA: 10/25/2019 PCP: Patient, No Pcp Per  HPI/Recap of past 24 hours: HPI from Dr Juanetta Beets [name] is a 63 y.o. 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  Patient reportedly sent in for worsening shortness of breath. The patient son notes that she started having vomiting, and was told that one of the therapist had [place].  She was tested 3 days ago and results were noted to be positive.  Since that time patient had been complaining of shortness of breath with O2 saturations noted to be as low as 80% at the skilled nursing facility with improvement to 98% on 4 L nasal cannula oxygen. In the ED, O2 saturations currently maintained on 3 L of nasal cannula oxygen.  Labs significant for high-sensitivity troponin 3469.  Chest x-ray noting interstitial edema or pneumonitis/chronic interstitial lung disease with mildly improved cardiomegaly. Cardiology was consulted. Pt admitted for further management.   Today, saw patient with RN present in the room.  No obvious shortness of breath, no worsening cough noted.  Remains afebrile.  RN attempted sign language.  Patient reported doing okay.      Assessment/Plan: Principal Problem:   COVID-19 virus infection Active Problems:   Acute respiratory failure with hypoxia (HCC)   Diabetes mellitus with ESRD (end-stage renal disease) (HCC)   Anemia in CKD (chronic kidney disease)   Blindness with deafness   PAD (peripheral artery disease) (HCC)   ESRD (end stage renal disease) (HCC)   Chronic combined systolic and diastolic congestive heart failure ([place])   Dyslipidemia associated with type 2 diabetes mellitus ([place])  Acute hypoxic respiratory failure likely 2/2 COVID-19 pneumonia/CAP Recently diagnosed with Covid on  10/28 per report Afebrile, with no leukocytosis Currently maintaining saturation on 2 L of nasal cannula Inflammatory markers elevated Procalcitonin elevated BC x2 pending Chest x-ray showed interstitial edema versus pneumonitis Continue dexamethasone, remdesivir Continue ceftriaxone, azithromycin for 5 days Albuterol inhaler, cough suppressants, vitamins Trend inflammatory markers  ??NSTEMI/history of CAD/anterior wall MI in 2016 Troponin elevated greater than 3000-> 2867 EKG with no acute ST changes Echo showed EF of 25 to 30%, with severely decreased LV function, grade 3 diastolic dysfunction Cardiology consulted, recommend Toprol instead of amlodipine as BP permits, stated elevated troponin is due to stress of Covid/pneumonia and would not work-up for other Continue Plavix, aspirin, nitroglycerin as needed Continue to monitor  Combined acute on chronic systolic and diastolic HF Appears somewhat euvolemic BNP Recent echo as above with decreased EF 25 to 30% Cardiology on board Strict I's and O's, daily weight Continue amiodarone  ESRD on HD, oliguric Fluid management by nephrology Nephrology on board  Anemia of chronic kidney disease Hemoglobin at baseline Daily CBC  Hypertension Stable Continue amlodipine  Diabetes mellitus type 2 Last A1c 5.4 SSI, Accu-Cheks, hypoglycemic protocol  History of peripheral vascular disease Status post amputation of bilateral lower extremities Continue Plavix  Paranoia Continue Abilify  GOC Patient with multiple medical condition, also disabled (blind, deaf, dumb) Palliative consulted        Malnutrition Type:      Malnutrition Characteristics:      Nutrition Interventions:       Estimated body mass index is 34.6 kg/m as calculated from the following:   Height as of 09/25/18: 5'  5" (1.651 m).   Weight as of this encounter: 94.3 kg.     Code Status: Full   Family Communication: None at bedside   Disposition Plan: TBD    Consultants:  Cardiology   Procedures:  None  Antimicrobials:  Ceftriaxone  Azithromycin  DVT prophylaxis: Heparin   Objective: Vitals:   10/26/19 0230 10/26/19 0232 10/26/19 0400 10/26/19 0900  BP: 112/63 106/60 124/71 127/72  Pulse: 81 76 87 88  Resp: 15 12 13    Temp: (!) 96.9 F (36.1 C)  (!) 97 F (36.1 C)   TempSrc: Axillary  Axillary   SpO2: 100% 100% 100%   Weight: 94.3 kg       Intake/Output Summary (Last 24 hours) at 10/26/2019 1212 Last data filed at 10/26/2019 0900 Gross per 24 hour  Intake 1223 ml  Output 3000 ml  Net -1777 ml   Filed Weights   10/25/19 2257 10/25/19 2350 10/26/19 0230  Weight: 96.9 kg 97 kg 94.3 kg    Exam:  General: NAD, blind/deaf/dumb  Cardiovascular: S1, S2 present  Respiratory:  Bilateral crackles noted at the lung bases  Abdomen: Soft, nontender, nondistended, bowel sounds present  Musculoskeletal: No bilateral pedal edema noted, 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 noted  Skin: Normal  Psychiatry: Unable to assess    Data Reviewed: CBC: Recent Labs  Lab 10/25/19 0641 10/26/19 0548  WBC 8.7 4.4  NEUTROABS 6.7 3.6  HGB 9.9* 9.2*  HCT 30.6* 28.1*  MCV 95.6 93.7  PLT 241 476   Basic Metabolic Panel: Recent Labs  Lab 10/25/19 0641 10/26/19 0548  NA 130* 136  K 5.1 4.6  CL 86* 95*  CO2 24 25  GLUCOSE 192* 147*  BUN 81* 43*  CREATININE 10.74* 7.11*  CALCIUM 7.8* 8.0*  MG  --  2.6*  PHOS  --  4.9*   GFR: CrCl cannot be calculated (Unknown ideal weight.). Liver Function Tests: Recent Labs  Lab 10/25/19 1159 10/26/19 0548  AST 31 22  ALT 20 16  ALKPHOS 94 90  BILITOT 0.7 0.5  PROT 7.4 7.7  ALBUMIN 3.4* 3.5   No results for input(s): LIPASE, AMYLASE in the last 168 hours. No results for input(s): AMMONIA in the last 168 hours. Coagulation Profile: No results for input(s): INR, PROTIME in the last 168 hours. Cardiac Enzymes: No results for  input(s): CKTOTAL, CKMB, CKMBINDEX, TROPONINI in the last 168 hours. BNP (last 3 results) No results for input(s): PROBNP in the last 8760 hours. HbA1C: Recent Labs    10/26/19 1009  HGBA1C 5.4   CBG: Recent Labs  Lab 10/25/19 1435 10/25/19 1707 10/25/19 2347 10/26/19 0434 10/26/19 0812  GLUCAP 129* 139* 165* 168* 96   Lipid Profile: Recent Labs    10/26/19 0548  CHOL 126  HDL 49  LDLCALC 70  TRIG 35  CHOLHDL 2.6   Thyroid Function Tests: No results for input(s): TSH, T4TOTAL, FREET4, T3FREE, THYROIDAB in the last 72 hours. Anemia Panel: Recent Labs    10/25/19 1159 10/26/19 0548  FERRITIN 984* 1,138*   Urine analysis:    Component Value Date/Time   COLORURINE AMBER (A) 03/27/2016 0810   APPEARANCEUR CLOUDY (A) 03/27/2016 0810   LABSPEC 1.015 03/27/2016 0810   PHURINE 5.5 03/27/2016 0810   GLUCOSEU NEGATIVE 03/27/2016 0810   HGBUR SMALL (A) 03/27/2016 0810   BILIRUBINUR SMALL (A) 03/27/2016 0810   KETONESUR 15 (A) 03/27/2016 0810   PROTEINUR >300 (A) 03/27/2016 0810   UROBILINOGEN 0.2  01/13/2015 0953   NITRITE NEGATIVE 03/27/2016 0810   LEUKOCYTESUR LARGE (A) 03/27/2016 0810   Sepsis Labs: @LABRCNTIP (procalcitonin:4,lacticidven:4)  ) Recent Results (from the past 240 hour(s))  Culture, blood (routine x 2)     Status: None (Preliminary result)   Collection Time: 10/25/19  5:05 PM   Specimen: BLOOD RIGHT FOREARM  Result Value Ref Range Status   Specimen Description BLOOD RIGHT FOREARM  Final   Special Requests   Final    BOTTLES DRAWN AEROBIC AND ANAEROBIC Blood Culture adequate volume   Culture   Final    NO GROWTH < 24 HOURS Performed at [facility], [place] [address], [place], [place] [address]    Report Status PENDING  Incomplete  Culture, blood (routine x 2)     Status: None (Preliminary result)   Collection Time: 10/25/19  5:14 PM   Specimen: BLOOD RIGHT HAND  Result Value Ref Range Status   Specimen Description BLOOD RIGHT HAND   Final   Special Requests   Final    BOTTLES DRAWN AEROBIC AND ANAEROBIC Blood Culture adequate volume   Culture   Final    NO GROWTH < 24 HOURS Performed at [facility], [place] [address] [place], [place] [address]    Report Status PENDING  Incomplete  MRSA PCR Screening     Status: None   Collection Time: 10/25/19 11:25 PM   Specimen: Nasopharyngeal  Result Value Ref Range Status   MRSA by PCR NEGATIVE NEGATIVE Final    Comment:        The GeneXpert MRSA Assay (FDA approved for NASAL specimens only), is one component of a comprehensive MRSA colonization surveillance program. It is not intended to diagnose MRSA infection nor to guide or monitor treatment for MRSA infections. Performed at [facility], [place] [address], [place], [place] [address]       Studies: No results found.  Scheduled Meds: . albuterol  2 puff Inhalation Q6H  . amiodarone  200 mg Oral QPM  . amLODipine  5 mg Oral Daily  . ARIPiprazole  15 mg Oral Daily  . atorvastatin  10 mg Oral QPM  . Chlorhexidine Gluconate Cloth  6 each Topical Q0600  . clopidogrel  75 mg Oral QPM  . dexamethasone  6 mg Oral Q24H  . heparin  5,000 Units Subcutaneous Q8H  . heparin  9,000 Units Dialysis Once in dialysis  . insulin aspart  0-9 Units Subcutaneous Q4H  . sevelamer carbonate  1,600 mg Oral TID WC  . sodium chloride flush  3 mL Intravenous Q12H  . vitamin C  500 mg Oral Daily  . zinc sulfate  220 mg Oral Daily    Continuous Infusions: . azithromycin 250 mL/hr at 10/25/19 2309  . cefTRIAXone (ROCEPHIN)  IV    . famotidine (PEPCID) IV    . remdesivir 200 mg in NS 250 mL     Followed by  . remdesivir 100 mg in NS 250 mL       LOS: 1 day     Alma Friendly, MD [facility]  If 7PM-7AM, please contact night-coverage [email or web address] 10/26/2019, 12:12 PM

## 2019-10-26 NOTE — Progress Notes (Signed)
Pt had snack before/beginning of dialysis - half sandwich. Pt had frozen dinner lean cuisine tray. Pt then repeatedly asked for candy or orange juice multiple times. Communicated to pt w/ translator that she cannot have candy or sugar because she has diabetes. Pt reoriented to time, told that breakfast is in a few hours, pt expressed understanding. Pt continues to ask for candy, though.

## 2019-10-26 NOTE — Progress Notes (Signed)
Daughter updated to status of condition as previously described with interpreter presence. Daughter voiced appreciation of care.

## 2019-10-26 NOTE — Progress Notes (Signed)
[facility] Progress Note  Subjective: 3L off on HDyest w/ late BP drop to 100's.  4L > 2L [place] today. Patient not examined directly today given COVID-19 + status, utilizing exam of the primary team    Vitals:   10/26/19 0230 10/26/19 0232 10/26/19 0400 10/26/19 0900  BP: 112/63 106/60 124/71 127/72  Pulse: 81 76 87 88  Resp: 15 12 13    Temp: (!) 96.9 F (36.1 C)  (!) 97 F (36.1 C)   TempSrc: Axillary  Axillary   SpO2: 100% 100% 100%   Weight: 94.3 kg       Inpatient medications: . albuterol  2 puff Inhalation Q6H  . amiodarone  200 mg Oral QPM  . amLODipine  5 mg Oral Daily  . ARIPiprazole  15 mg Oral Daily  . atorvastatin  10 mg Oral QPM  . Chlorhexidine Gluconate Cloth  6 each Topical Q0600  . clopidogrel  75 mg Oral QPM  . dexamethasone  6 mg Oral Q24H  . heparin  5,000 Units Subcutaneous Q8H  . heparin  9,000 Units Dialysis Once in dialysis  . insulin aspart  0-9 Units Subcutaneous Q4H  . sevelamer carbonate  1,600 mg Oral TID WC  . sodium chloride flush  3 mL Intravenous Q12H  . vitamin C  500 mg Oral Daily  . zinc sulfate  220 mg Oral Daily   . azithromycin 250 mL/hr at 10/25/19 2309  . cefTRIAXone (ROCEPHIN)  IV    . famotidine (PEPCID) IV    . remdesivir 200 mg in NS 250 mL     Followed by  . remdesivir 100 mg in NS 250 mL     acetaminophen, chlorpheniramine-HYDROcodone, guaiFENesin-dextromethorphan, nitroGLYCERIN, ondansetron **OR** ondansetron (ZOFRAN) IV, polyethylene glycol, polyvinyl alcohol    Exam:  Patient not examined directly given COVID-19 + status, utilizing exam of the primary team and observations of RN's.    Home meds:  - amlodipine 5 hs  - insulin lispro SSI ac  - aripiprazole 15 qd/ tramadol prn  - amiodarone 200 hs/ atrovastatin 10 hs/ clopidogrel 75/ sl ntg prn  - sevelamer 3 ac tid  - prn's/ vitamins/ supplements   CXR mild-mod bilat IS infiltrates   Outpt HD: [facility] TTS  4h  2/2.25  94.5kg  Hep 9000   LUA AVF   -  mircera 60 q2wks, last 10/26  - hect 3  - parsabiv 5  - renvela 2 ac, no BP meds, usual wt gain 3-4kg     Assessment/ Plan: 1. COVID+ PNA - hypoxia due to COVID infection +/- volume excess. Has not missed any HD and is typically compliant w/ fluids, etc. 2. ESRD - on HD TTS. Has not missed any HD. Had HD Sat night here, 3L off. Next HD planned for 11/3.   3. HTN/ volume - getting home norvasc 5 qd, BP's normal. 3L UF overnight w/ HD, down to dry wt today. May tolerate lowering of dry wt some while here. 4. IDDM 5. Deaf/ blind 6. CAD/ MI/ hx cardiac arrest 7. pulm HTN    Rob Tollie Canada 10/26/2019, 11:58 AM  Iron/TIBC/Ferritin/ %Sat    Component Value Date/Time   IRON 74 12/14/2014 1445   TIBC 216 (L) 12/14/2014 1445   FERRITIN 1,138 (H) 10/26/2019 0548   IRONPCTSAT 34 12/14/2014 1445   Recent Labs  Lab 10/26/19 0548  NA 136  K 4.6  CL 95*  CO2 25  GLUCOSE 147*  BUN 43*  CREATININE 7.11*  CALCIUM 8.0*  PHOS 4.9*  ALBUMIN 3.5   Recent Labs  Lab 10/26/19 0548  AST 22  ALT 16  ALKPHOS 90  BILITOT 0.5  PROT 7.7   Recent Labs  Lab 10/26/19 0548  WBC 4.4  HGB 9.2*  HCT 28.1*  PLT 210

## 2019-10-26 NOTE — Progress Notes (Signed)
  Echocardiogram 2D Echocardiogram with Definity has been performed.  [name] 10/26/2019, 10:26 AM

## 2019-10-26 NOTE — Progress Notes (Signed)
Pt expressing physical needs by writing in this writer's hand. Pt expressed hunger and thirst, and received a small snack during HD. Pt tolerating well. Pt also can understand short words when written letter by letter in her palm. ASL translator able to teach this Probation officer basic signs in order to press them into pt's palm, or put pt's hands on top of this writer's hands, in order to use more complex signs.

## 2019-10-26 NOTE — Progress Notes (Signed)
Per previous hospital admissions, pt has been deaf and blind since childhood. Pt has some non standard signs, but is able to explain through writing on palm.

## 2019-10-26 NOTE — Progress Notes (Signed)
Via [place], [place], [place] interpreter: Patient states feeling much better today. On arrival, states very SOB and chest felt tight. States HD has helped and chest tightness has improved. Non-productive cough present. Discussed Cough Deep Breath. Patient understands cough deep breath with return demonstration. Information provided re: why in hospital. Educated to medications.  Weaned off of oxygen x2 hours> maintaining sats at 99-100%.

## 2019-10-27 DIAGNOSIS — I5042 Chronic combined systolic (congestive) and diastolic (congestive) heart failure: Secondary | ICD-10-CM

## 2019-10-27 LAB — CBC WITH DIFFERENTIAL/PLATELET
Abs Immature Granulocytes: 0.04 10*3/uL (ref 0.00–0.07)
Basophils Absolute: 0 10*3/uL (ref 0.0–0.1)
Basophils Relative: 0 %
Eosinophils Absolute: 0 10*3/uL (ref 0.0–0.5)
Eosinophils Relative: 0 %
HCT: 27.1 % — ABNORMAL LOW (ref 36.0–46.0)
Hemoglobin: 8.8 g/dL — ABNORMAL LOW (ref 12.0–15.0)
Immature Granulocytes: 1 %
Lymphocytes Relative: 9 %
Lymphs Abs: 0.5 10*3/uL — ABNORMAL LOW (ref 0.7–4.0)
MCH: 30.3 pg (ref 26.0–34.0)
MCHC: 32.5 g/dL (ref 30.0–36.0)
MCV: 93.4 fL (ref 80.0–100.0)
Monocytes Absolute: 0.5 10*3/uL (ref 0.1–1.0)
Monocytes Relative: 8 %
Neutro Abs: 4.7 10*3/uL (ref 1.7–7.7)
Neutrophils Relative %: 82 %
Platelets: 225 10*3/uL (ref 150–400)
RBC: 2.9 MIL/uL — ABNORMAL LOW (ref 3.87–5.11)
RDW: 15.9 % — ABNORMAL HIGH (ref 11.5–15.5)
WBC: 5.7 10*3/uL (ref 4.0–10.5)
nRBC: 0.3 % — ABNORMAL HIGH (ref 0.0–0.2)

## 2019-10-27 LAB — COMPREHENSIVE METABOLIC PANEL
ALT: 29 U/L (ref 0–44)
AST: 36 U/L (ref 15–41)
Albumin: 3.4 g/dL — ABNORMAL LOW (ref 3.5–5.0)
Alkaline Phosphatase: 98 U/L (ref 38–126)
Anion gap: 17 — ABNORMAL HIGH (ref 5–15)
BUN: 77 mg/dL — ABNORMAL HIGH (ref 8–23)
CO2: 24 mmol/L (ref 22–32)
Calcium: 7.6 mg/dL — ABNORMAL LOW (ref 8.9–10.3)
Chloride: 93 mmol/L — ABNORMAL LOW (ref 98–111)
Creatinine, Ser: 9.57 mg/dL — ABNORMAL HIGH (ref 0.44–1.00)
GFR calc Af Amer: 5 mL/min — ABNORMAL LOW (ref >60)
GFR calc non Af Amer: 4 mL/min — ABNORMAL LOW (ref >60)
Glucose, Bld: 148 mg/dL — ABNORMAL HIGH (ref 70–99)
Potassium: 5.5 mmol/L — ABNORMAL HIGH (ref 3.5–5.1)
Sodium: 134 mmol/L — ABNORMAL LOW (ref 135–145)
Total Bilirubin: 0.6 mg/dL (ref 0.3–1.2)
Total Protein: 7.4 g/dL (ref 6.5–8.1)

## 2019-10-27 LAB — C-REACTIVE PROTEIN: CRP: 1 mg/dL — ABNORMAL HIGH (ref <1.0)

## 2019-10-27 LAB — D-DIMER, QUANTITATIVE: D-Dimer, Quant: 1.46 ug/mL-FEU — ABNORMAL HIGH (ref 0.00–0.50)

## 2019-10-27 LAB — GLUCOSE, CAPILLARY
Glucose-Capillary: 157 mg/dL — ABNORMAL HIGH (ref 70–99)
Glucose-Capillary: 144 mg/dL — ABNORMAL HIGH (ref 70–99)
Glucose-Capillary: 116 mg/dL — ABNORMAL HIGH (ref 70–99)
Glucose-Capillary: 187 mg/dL — ABNORMAL HIGH (ref 70–99)
Glucose-Capillary: 202 mg/dL — ABNORMAL HIGH (ref 70–99)
Glucose-Capillary: 193 mg/dL — ABNORMAL HIGH (ref 70–99)
Glucose-Capillary: 215 mg/dL — ABNORMAL HIGH (ref 70–99)

## 2019-10-27 LAB — PHOSPHORUS: Phosphorus: 4.4 mg/dL (ref 2.5–4.6)

## 2019-10-27 LAB — MAGNESIUM: Magnesium: 2.7 mg/dL — ABNORMAL HIGH (ref 1.7–2.4)

## 2019-10-27 LAB — FERRITIN: Ferritin: 3851 ng/mL — ABNORMAL HIGH (ref 11–307)

## 2019-10-27 LAB — PROCALCITONIN: Procalcitonin: 12.84 ng/mL

## 2019-10-27 MED ORDER — SODIUM ZIRCONIUM CYCLOSILICATE 10 G PO PACK
10.0000 g | PACK | Freq: Two times a day (BID) | ORAL | Status: DC
  Administered 2019-10-27 – 2019-10-29 (×5): 10 g via ORAL
  Filled 2019-10-27 (×5): qty 1

## 2019-10-27 MED ORDER — METOPROLOL SUCCINATE ER 25 MG PO TB24
25.0000 mg | ORAL_TABLET | Freq: Every day | ORAL | Status: DC
  Administered 2019-10-27 – 2019-10-29 (×2): 25 mg via ORAL
  Filled 2019-10-27 (×3): qty 1

## 2019-10-27 NOTE — TOC Initial Note (Signed)
Transition of Care [facility]) - Initial/Assessment Note    Patient Details  Name: [name] MRN: [id number] Date of Birth: Feb 01, 1956  [facility]) CM/SW Contact:    Gelene Mink, [place] Phone Number: 10/27/2019, 11:14 AM  Clinical Narrative:                  CSW called and spoke with the patient's son, Elanie Hammitt. He stated that he is his mother's POA. He stated that he would like her to return to [place]. He asked if she was medically stable, CSW stated she is still receiving antibiotics. CSW explained that the patient's HD clinic would be temporarily changed due to her testing positive for COVID.   CSW stated she would contact him when his mother became medically ready.   CSW spoke with Ebony Hail at [facility]. They are able to take the patient back whenever she is medically ready.   CSW will continue to follow.   Expected Discharge Plan: Skilled Nursing Facility Barriers to Discharge: Continued Medical Work up   Patient Goals and CMS Choice Patient states their goals for this hospitalization and ongoing recovery are:: Pt son would like his mother to get better before returning to SNF.   Choice offered to / list presented to : NA  Expected Discharge Plan and Services Expected Discharge Plan: [place] In-house Referral: Clinical Social Work Discharge Planning Services: NA Post Acute Care Choice: [place] Living arrangements for the past 2 months: [place]                 DME Arranged: N/A DME Agency: NA       HH Arranged: NA [place] Agency: NA        Prior Living Arrangements/Services Living arrangements for the past 2 months: [place] Lives with:: Facility Resident Patient language and need for interpreter reviewed:: No Do you feel safe going back to the place where you live?: Yes      Need for Family Participation in Patient Care: Yes (Comment) Care giver support system in  place?: Yes (comment)   Criminal Activity/Legal Involvement Pertinent to Current Situation/Hospitalization: No - Comment as needed  Activities of Daily Living      Permission Sought/Granted Permission sought to share information with : Case Manager Permission granted to share information with : Yes, Verbal Permission Granted  Share Information with NAME: Raven Furnas  Permission granted to share info w AGENCY: [place] granted to share info w Relationship: Son     Emotional Assessment Appearance:: Appears stated age Attitude/Demeanor/Rapport: Unable to Assess Affect (typically observed): Unable to Assess Orientation: : Oriented to Self, Oriented to Situation, Oriented to Place Alcohol / Substance Use: Not Applicable Psych Involvement: No (comment)  Admission diagnosis:  NSTEMI (non-ST elevated myocardial infarction) ([place]) [I21.4] End-stage renal disease on hemodialysis ([place]) [N18.6, Z99.2] COVID-19 virus infection [U07.1] Pneumonia due to COVID-19 virus [U07.1, J12.89] Patient Active Problem List   Diagnosis Date Noted  . COVID-19 virus infection 10/25/2019  . Adjustment disorder with disturbance of conduct   . Benign hypertension with ESRD (end-stage renal disease) ([place]) 02/12/2018  . Uncontrolled type 2 diabetes mellitus with end-stage renal disease ([place]) 11/26/2017  . 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 Chronic combined systolic and diastolic congestive heart failure ([place]) 12/15/2015  . Dyslipidemia associated with type 2 diabetes mellitus ([place]) 12/15/2015  . Ventricular tachycardia, sustained ([place]) 12/15/2015  . Psychosis, paranoid ([place]) 08/20/2015  . Non compliance w  medication regimen 08/20/2015  . Hyperkalemia 04/19/2015  . ESRD (end stage renal disease) ([place]) 04/19/2015  . PAD (peripheral artery disease) ([place]) 03/23/2015  . Acute encephalopathy   . FTT (failure to thrive) in adult 01/14/2015  . Blindness with deafness 12/16/2014  . Anemia in CKD (chronic  kidney disease) 12/14/2014  . Cardiomyopathy, ischemic-EF 30-35% 11/21/2014  . Chronic constipation 12/24/2013  . Diabetes mellitus with ESRD (end-stage renal disease) ([place]) 15-Feb-202014  . Acute respiratory failure with hypoxia ([place]) 12/22/2013  . Hypertensive heart disease with CHF (congestive heart failure) ([place]) 08/09/2009   PCP:  Patient, No Pcp Per Pharmacy:  No Pharmacies Listed    Social Determinants of Health (SDOH) Interventions    Readmission Risk Interventions No flowsheet data found.

## 2019-10-27 NOTE — Progress Notes (Signed)
Patient ID: [name], female   DOB: 03-24-1956, 63 y.o.   MRN: [id number] S: No events overnight O:BP 126/63 (BP Location: Right Arm)   Pulse 81   Temp 98 F (36.7 C) (Axillary)   Resp 17   Wt 96.6 kg   SpO2 100%   BMI 35.44 kg/m   Intake/Output Summary (Last 24 hours) at 10/27/2019 1130 Last data filed at 10/27/2019 1046 Gross per 24 hour  Intake 243 ml  Output 200 ml  Net 43 ml   Intake/Output: I/O last 3 completed shifts: In: 87 [P.O.:960; I.V.:6; IV Piggyback:500] Out: 3000 [Other:3000]  Intake/Output this shift:  Total I/O In: -  Out: 200 [Urine:200] Weight change: -0.3 kg Physical exam: unable to complete due to COVID + status.  In order to preserve PPE equipment and to minimize exposure to providers.  Notes from other caregivers reviewed  Recent Labs  Lab 10/25/19 0641 10/25/19 1159 10/26/19 0548 10/27/19 0436  NA 130*  --  136 134*  K 5.1  --  4.6 5.5*  CL 86*  --  95* 93*  CO2 24  --  25 24  GLUCOSE 192*  --  147* 148*  BUN 81*  --  43* 77*  CREATININE 10.74*  --  7.11* 9.57*  ALBUMIN  --  3.4* 3.5 3.4*  CALCIUM 7.8*  --  8.0* 7.6*  PHOS  --   --  4.9* 4.4  AST  --  31 22 36  ALT  --  20 16 29    Liver Function Tests: Recent Labs  Lab 10/25/19 1159 10/26/19 0548 10/27/19 0436  AST 31 22 36  ALT 20 16 29   ALKPHOS 94 90 98  BILITOT 0.7 0.5 0.6  PROT 7.4 7.7 7.4  ALBUMIN 3.4* 3.5 3.4*   No results for input(s): LIPASE, AMYLASE in the last 168 hours. No results for input(s): AMMONIA in the last 168 hours. CBC: Recent Labs  Lab 10/25/19 0641 10/26/19 0548 10/27/19 0436  WBC 8.7 4.4 5.7  NEUTROABS 6.7 3.6 4.7  HGB 9.9* 9.2* 8.8*  HCT 30.6* 28.1* 27.1*  MCV 95.6 93.7 93.4  PLT 241 210 225   Cardiac Enzymes: No results for input(s): CKTOTAL, CKMB, CKMBINDEX, TROPONINI in the last 168 hours. CBG: Recent Labs  Lab 10/26/19 1554 10/26/19 2128 10/27/19 0123 10/27/19 0425 10/27/19 0808  GLUCAP 114* 186* 157* 144* 116*    Iron  Studies:  Recent Labs    10/27/19 0436  FERRITIN 3,851*   Studies/Results: No results found. [place] Kitchen albuterol  2 puff Inhalation Q6H  . amiodarone  200 mg Oral QPM  . amLODipine  5 mg Oral Daily  . ARIPiprazole  15 mg Oral Daily  . atorvastatin  10 mg Oral QPM  . Chlorhexidine Gluconate Cloth  6 each Topical Q0600  . clopidogrel  75 mg Oral QPM  . dexamethasone  6 mg Oral Q24H  . heparin  5,000 Units Subcutaneous Q8H  . heparin  9,000 Units Dialysis Once in dialysis  . insulin aspart  0-9 Units Subcutaneous Q4H  . sevelamer carbonate  1,600 mg Oral TID WC  . sodium chloride flush  3 mL Intravenous Q12H  . sodium zirconium cyclosilicate  10 g Oral BID  . vitamin C  500 mg Oral Daily  . zinc sulfate  220 mg Oral Daily    BMET    Component Value Date/Time   NA 134 (L) 10/27/2019 0436   NA 138 04/23/2018   K 5.5 (H)  10/27/2019 0436   CL 93 (L) 10/27/2019 0436   CO2 24 10/27/2019 0436   GLUCOSE 148 (H) 10/27/2019 0436   BUN 77 (H) 10/27/2019 0436   BUN 38 (A) 04/23/2018   CREATININE 9.57 (H) 10/27/2019 0436   CREATININE 0.76 05/23/2011 0956   CALCIUM 7.6 (L) 10/27/2019 0436   GFRNONAA 4 (L) 10/27/2019 0436   GFRAA 5 (L) 10/27/2019 0436   CBC    Component Value Date/Time   WBC 5.7 10/27/2019 0436   RBC 2.90 (L) 10/27/2019 0436   HGB 8.8 (L) 10/27/2019 0436   HCT 27.1 (L) 10/27/2019 0436   HCT 21.3 (L) 05/01/2018 0223   PLT 225 10/27/2019 0436   MCV 93.4 10/27/2019 0436   MCH 30.3 10/27/2019 0436   MCHC 32.5 10/27/2019 0436   RDW 15.9 (H) 10/27/2019 0436   LYMPHSABS 0.5 (L) 10/27/2019 0436   MONOABS 0.5 10/27/2019 0436   EOSABS 0.0 10/27/2019 0436   BASOSABS 0.0 10/27/2019 0436    Outpt HD:GO TTS 4h 2/2.25 94.5kg Hep 9000 LUA AVF  - mircera 60 q2wks, last 10/26 - hect 3 - parsabiv 5 - renvela 2 ac, no BP meds, usual wt gain 3-4kg  Assessment/Plan:  1. Covid+ PNA- with hypoxia.  Will cont with HD and UF as bp tolerates. 2. ESRD- cont with TTS  schedule and UF as bp tolerates 3. Hyperkalemia- will give lokelma and plan for HD tomorrow. 4. HTN/Volume- stable will challenge edw. 5. IDDM 6. Deaf/blind 7. CAD  8. Pulmonary HTN  Donetta Potts, MD [facility] [phone]

## 2019-10-27 NOTE — Progress Notes (Signed)
Renal Navigator notes patient's positive COVID 19 test result (10/20/19 per notes). Patient is a dialysis patient, typically on a MWF schedule at [facility], however, she will now treat at [facility] isolation shift on TTS at 12:00pm upon discharge from the hospital. This has already been arranged. She needs to [facility] to [facility] at 11:45[facility] and wait in the parking lot to be called in to the clinic for treatments. If [place] will not transport patient to OP HD isolation shift at discharge, Renal Navigator will arrange PTAR transport to HD treatment.  Alphonzo Cruise, [place] Renal Navigator [phone]

## 2019-10-27 NOTE — Progress Notes (Signed)
Pt prefers 1L [place] for comfort although o2 sats 100% ra. Feels short of breath. Breathing unlabored, in no resp distress and clear lung sounds.

## 2019-10-27 NOTE — Progress Notes (Signed)
PROGRESS NOTE  SEATTLE DALPORTO GGE:[id number] DOB: 1956/08/29 DOA: 10/25/2019 PCP: [name], No Pcp Per  HPI/Recap of past 24 hours: HPI from Dr Juanetta Beets Cudworth is a 63 y.o. 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  [name] reportedly sent in for worsening shortness of breath. The [name] son notes that she started having vomiting, and was told that one of the therapist had Marvin.  She was tested 3 days ago and results were noted to be positive.  Since that time [name] had been complaining of shortness of breath with O2 saturations noted to be as low as 80% at the skilled nursing facility with improvement to 98% on 4 L nasal cannula oxygen. In the ED, O2 saturations currently maintained on 3 L of nasal cannula oxygen.  Labs significant for high-sensitivity troponin 3469.  Chest x-ray noting interstitial edema or pneumonitis/chronic interstitial lung disease with mildly improved cardiomegaly. Cardiology was consulted. Pt admitted for further management.     Today, [name] seen and examined at bedside.  Unable to obtain ROS due to disability.  [name] looked comfortable, no increased work of breathing, not in any pain.     Assessment/Plan: Principal Problem:   COVID-19 virus infection Active Problems:   Acute respiratory failure with hypoxia (HCC)   Diabetes mellitus with ESRD (end-stage renal disease) (HCC)   Anemia in CKD (chronic kidney disease)   Blindness with deafness   PAD (peripheral artery disease) (HCC)   ESRD (end stage renal disease) (HCC)   Chronic combined systolic and diastolic congestive heart failure ([place])   Dyslipidemia associated with type 2 diabetes mellitus ([place])  Acute hypoxic respiratory failure likely 2/2 COVID-19 pneumonia/CAP Recently diagnosed with Covid on 10/28 per report  Afebrile, with no leukocytosis Currently maintaining saturation on room air, but prefers being on oxygen for comfort Inflammatory markers elevated Procalcitonin elevated BC x2 NGTD Chest x-ray showed interstitial edema versus pneumonitis Continue dexamethasone, remdesivir Continue ceftriaxone, azithromycin for 5 days Albuterol inhaler, cough suppressants, vitamins Trend inflammatory markers  ??NSTEMI/history of CAD/anterior wall MI in 2016 Troponin elevated greater than 3000-> 2867 EKG with no acute ST changes Echo showed EF of 25 to 30%, with severely decreased LV function, grade 3 diastolic dysfunction Cardiology consulted, recommend Toprol instead of amlodipine as BP permits, stated elevated troponin is due to stress of Covid/pneumonia and would not work-up for other Continue Plavix, aspirin, nitroglycerin as needed Start toprol Continue to monitor  Combined acute on chronic systolic and diastolic HF Appears somewhat euvolemic BNP Recent echo as above with decreased EF 25 to 30% Cardiology on board Strict I's and O's, daily weight Continue amiodarone, start toprol  ESRD on HD, oliguric with hyperkalemia Fluid management by nephrology Started on [facility] Nephrology on board  Anemia of chronic kidney disease Hemoglobin at baseline Daily CBC  Hypertension Stable Continue amlodipine  Diabetes mellitus type 2 Last A1c 5.4 SSI, Accu-Cheks, hypoglycemic protocol  History of peripheral vascular disease Status post amputation of bilateral lower extremities Continue Plavix  Paranoia Continue Abilify  GOC [name] with multiple medical condition, also disabled (blind, deaf, dumb) Palliative consulted        Malnutrition Type:      Malnutrition Characteristics:      Nutrition Interventions:       Estimated body mass index is 35.44 kg/m as calculated  from the following:   Height as of 09/25/18: 5\' 5"  (1.651 m).   Weight as of this encounter: 96.6 kg.      Code Status: Full   Family Communication: None at bedside  Disposition Plan: TBD    Consultants:  Cardiology   Procedures:  None  Antimicrobials:  Ceftriaxone  Azithromycin  DVT prophylaxis: Heparin   Objective: Vitals:   10/27/19 0600 10/27/19 0800 10/27/19 1200 10/27/19 1412  BP: 133/71 126/63 (!) 149/76 (!) 145/69  Pulse: 80 81 87 87  Resp: 16 17 18 18   Temp:  98 F (36.7 C) 97.7 F (36.5 C)   TempSrc:  Axillary Axillary   SpO2: 96% 100% 100% 100%  Weight:        Intake/Output Summary (Last 24 hours) at 10/27/2019 1536 Last data filed at 10/27/2019 1400 Gross per 24 hour  Intake 800 ml  Output 200 ml  Net 600 ml   Filed Weights   10/25/19 2350 10/26/19 0230 10/27/19 0500  Weight: 97 kg 94.3 kg 96.6 kg    Exam:  General: NAD, blind/deaf/dumb  Cardiovascular: S1, S2 present  Respiratory:  Bilateral crackles noted at lung bases  Abdomen: Soft, nontender, nondistended, bowel sounds present  Musculoskeletal: No bilateral pedal edema noted, 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 noted  Skin: Normal  Psychiatry:  Unable to assess    Data Reviewed: CBC: Recent Labs  Lab 10/25/19 0641 10/26/19 0548 10/27/19 0436  WBC 8.7 4.4 5.7  NEUTROABS 6.7 3.6 4.7  HGB 9.9* 9.2* 8.8*  HCT 30.6* 28.1* 27.1*  MCV 95.6 93.7 93.4  PLT 241 210 161   Basic Metabolic Panel: Recent Labs  Lab 10/25/19 0641 10/26/19 0548 10/27/19 0436  NA 130* 136 134*  K 5.1 4.6 5.5*  CL 86* 95* 93*  CO2 24 25 24   GLUCOSE 192* 147* 148*  BUN 81* 43* 77*  CREATININE 10.74* 7.11* 9.57*  CALCIUM 7.8* 8.0* 7.6*  MG  --  2.6* 2.7*  PHOS  --  4.9* 4.4   GFR: CrCl cannot be calculated (Unknown ideal weight.). Liver Function Tests: Recent Labs  Lab 10/25/19 1159 10/26/19 0548 10/27/19 0436  AST 31 22 36  ALT 20 16 29   ALKPHOS 94 90 98  BILITOT 0.7 0.5 0.6  PROT 7.4 7.7 7.4  ALBUMIN 3.4* 3.5 3.4*   No results for input(s): LIPASE,  AMYLASE in the last 168 hours. No results for input(s): AMMONIA in the last 168 hours. Coagulation Profile: No results for input(s): INR, PROTIME in the last 168 hours. Cardiac Enzymes: No results for input(s): CKTOTAL, CKMB, CKMBINDEX, TROPONINI in the last 168 hours. BNP (last 3 results) No results for input(s): PROBNP in the last 8760 hours. HbA1C: Recent Labs    10/26/19 1009  HGBA1C 5.4   CBG: Recent Labs  Lab 10/26/19 2128 10/27/19 0123 10/27/19 0425 10/27/19 0808 10/27/19 1359  GLUCAP 186* 157* 144* 116* 187*   Lipid Profile: Recent Labs    10/26/19 0548  CHOL 126  HDL 49  LDLCALC 70  TRIG 35  CHOLHDL 2.6   Thyroid Function Tests: No results for input(s): TSH, T4TOTAL, FREET4, T3FREE, THYROIDAB in the last 72 hours. Anemia Panel: Recent Labs    10/26/19 0548 10/27/19 0436  FERRITIN 1,138* 3,851*   Urine analysis:    Component Value Date/Time   COLORURINE AMBER (A) 03/27/2016 0810   APPEARANCEUR CLOUDY (A) 03/27/2016 0810   LABSPEC 1.015 03/27/2016 0810   PHURINE 5.5 03/27/2016 0810  GLUCOSEU NEGATIVE 03/27/2016 0810   HGBUR SMALL (A) 03/27/2016 0810   BILIRUBINUR SMALL (A) 03/27/2016 0810   KETONESUR 15 (A) 03/27/2016 0810   PROTEINUR >300 (A) 03/27/2016 0810   UROBILINOGEN 0.2 01/13/2015 0953   NITRITE NEGATIVE 03/27/2016 0810   LEUKOCYTESUR LARGE (A) 03/27/2016 0810   Sepsis Labs: @LABRCNTIP (procalcitonin:4,lacticidven:4)  ) Recent Results (from the past 240 hour(s))  Culture, blood (routine x 2)     Status: None (Preliminary result)   Collection Time: 10/25/19  5:05 PM   Specimen: BLOOD RIGHT FOREARM  Result Value Ref Range Status   Specimen Description BLOOD RIGHT FOREARM  Final   Special Requests   Final    BOTTLES DRAWN AEROBIC AND ANAEROBIC Blood Culture adequate volume   Culture   Final    NO GROWTH 2 DAYS Performed at [facility], [place] [address], [place], [place] [address]    Report Status PENDING  Incomplete   Culture, blood (routine x 2)     Status: None (Preliminary result)   Collection Time: 10/25/19  5:14 PM   Specimen: BLOOD RIGHT HAND  Result Value Ref Range Status   Specimen Description BLOOD RIGHT HAND  Final   Special Requests   Final    BOTTLES DRAWN AEROBIC AND ANAEROBIC Blood Culture adequate volume   Culture   Final    NO GROWTH 2 DAYS Performed at [facility], [place] [address], [place], [place] [address]    Report Status PENDING  Incomplete  MRSA PCR Screening     Status: None   Collection Time: 10/25/19 11:25 PM   Specimen: Nasopharyngeal  Result Value Ref Range Status   MRSA by PCR NEGATIVE NEGATIVE Final    Comment:        The GeneXpert MRSA Assay (FDA approved for NASAL specimens only), is one component of a comprehensive MRSA colonization surveillance program. It is not intended to diagnose MRSA infection nor to guide or monitor treatment for MRSA infections. Performed at [facility], [place] [address], [place], [place] [address]       Studies: No results found.  Scheduled Meds: . albuterol  2 puff Inhalation Q6H  . amiodarone  200 mg Oral QPM  . amLODipine  5 mg Oral Daily  . ARIPiprazole  15 mg Oral Daily  . atorvastatin  10 mg Oral QPM  . Chlorhexidine Gluconate Cloth  6 each Topical Q0600  . clopidogrel  75 mg Oral QPM  . dexamethasone  6 mg Oral Q24H  . heparin  5,000 Units Subcutaneous Q8H  . heparin  9,000 Units Dialysis Once in dialysis  . insulin aspart  0-9 Units Subcutaneous Q4H  . sevelamer carbonate  1,600 mg Oral TID WC  . sodium chloride flush  3 mL Intravenous Q12H  . sodium zirconium cyclosilicate  10 g Oral BID  . vitamin C  500 mg Oral Daily  . zinc sulfate  220 mg Oral Daily    Continuous Infusions: . azithromycin 500 mg (10/26/19 1714)  . cefTRIAXone (ROCEPHIN)  IV 1 g (10/26/19 1627)  . famotidine (PEPCID) IV 20 mg (10/27/19 1046)  . remdesivir 200 mg in NS 250 mL     Followed by  . remdesivir 100 mg in NS 250 mL  100 mg (10/26/19 1829)     LOS: 2 days     Alma Friendly, MD [facility]  If 7PM-7AM, please contact night-coverage [email or web address] 10/27/2019, 3:36 PM

## 2019-10-27 NOTE — NC FL2 (Signed)
[place] LEVEL OF CARE SCREENING TOOL     IDENTIFICATION  Patient Name: [name] Birthdate: Mar 29, 1956 Sex: female Admission Date (Current Location): 10/25/2019  [place] and [place] Number:  [name] [id number] [place] and Address:  [facility] [address], [place] [address] [place], [place] [address]      Provider Number: [id number]  Attending Physician Name and Address:  Alma Friendly, MD  Relative Name and Phone Number:  [name], [name], [phone] & [name], Daughter, [phone]    Current Level of Care: Hospital Recommended Level of Care: [place] Prior Approval Number:    Date Approved/Denied:   PASRR Number: [id number]  Discharge Plan: SNF    Current Diagnoses: Patient Active Problem List   Diagnosis Date Noted  . COVID-19 virus infection 10/25/2019  . Adjustment disorder with disturbance of conduct   . Benign hypertension with ESRD (end-stage renal disease) ([place]) 02/12/2018  . Uncontrolled type 2 diabetes mellitus with end-stage renal disease ([place]) 11/26/2017  . 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 Chronic combined systolic and diastolic congestive heart failure ([place]) 12/15/2015  . Dyslipidemia associated with type 2 diabetes mellitus ([place]) 12/15/2015  . Ventricular tachycardia, sustained ([place]) 12/15/2015  . Psychosis, paranoid ([place]) 08/20/2015  . Non compliance w medication regimen 08/20/2015  . Hyperkalemia 04/19/2015  . ESRD (end stage renal disease) ([place]) 04/19/2015  . PAD (peripheral artery disease) ([place]) 03/23/2015  . Acute encephalopathy   . FTT (failure to thrive) in adult 01/14/2015  . Blindness with deafness 12/16/2014  . Anemia in CKD (chronic kidney disease) 12/14/2014  . Cardiomyopathy, ischemic-EF 30-35% 11/21/2014  . Chronic constipation 12/24/2013  . Diabetes mellitus with ESRD (end-stage renal disease) ([place]) 01-25-202014  . Acute respiratory failure with hypoxia ([place]) 12/22/2013  .  Hypertensive heart disease with CHF (congestive heart failure) ([place]) 08/09/2009    Orientation RESPIRATION BLADDER Height & Weight     Self, Place, Situation  O2(100, [place], 4L) Incontinent, External catheter Weight: 212 lb 15.4 oz (96.6 kg) Height:     BEHAVIORAL SYMPTOMS/MOOD NEUROLOGICAL BOWEL NUTRITION STATUS      Incontinent Diet(renal/carb modified diet, no snacks, thin liquids, fluid restricition 1218ml)  AMBULATORY STATUS COMMUNICATION OF NEEDS Skin   Extensive Assist Verbally Other (Comment)(Dry skin)                       Personal Care Assistance Level of Assistance  Bathing, Feeding, Dressing, Total care Bathing Assistance: Limited assistance Feeding assistance: Limited assistance Dressing Assistance: Limited assistance Total Care Assistance: Limited assistance   Functional Limitations Info  Sight, Hearing, Speech Sight Info: Impaired Hearing Info: Impaired(Pt is deaf) Speech Info: Adequate    SPECIAL CARE FACTORS FREQUENCY                       Contractures Contractures Info: Not present    Additional Factors Info  Code Status, Allergies, Insulin Sliding Scale, Isolation Precautions Code Status Info: Full Code Allergies Info: No Known Allergies   Insulin Sliding Scale Info: insulin aspart novolog 0-9 units every 4 hours Isolation Precautions Info: COVID +     Current Medications (10/27/2019):  This is the current hospital active medication list Current Facility-Administered Medications  Medication Dose Route Frequency Provider Last Rate Last Dose  . acetaminophen (TYLENOL) tablet 650 mg  650 mg Oral Q6H PRN Smith, Rondell A, MD      . albuterol (VENTOLIN HFA) 108 (90 Base) MCG/ACT inhaler 2 puff  2 puff Inhalation Q6H Fuller Plan A, MD   2 puff at 10/27/19 1039  . amiodarone (PACERONE) tablet 200 mg  200 mg Oral QPM Smith, Rondell A, MD   200 mg at 10/26/19 1637  . amLODipine (NORVASC) tablet 5 mg  5 mg Oral Daily Tamala Julian, Rondell A, MD   5 mg at  10/27/19 1041  . ARIPiprazole (ABILIFY) tablet 15 mg  15 mg Oral Daily Tamala Julian, Rondell A, MD   15 mg at 10/27/19 1041  . atorvastatin (LIPITOR) tablet 10 mg  10 mg Oral QPM Smith, Rondell A, MD   10 mg at 10/26/19 1636  . azithromycin (ZITHROMAX) 500 mg in sodium chloride 0.9 % 250 mL IVPB  500 mg Intravenous Q24H Smith, Rondell A, MD 250 mL/hr at 10/26/19 1714 500 mg at 10/26/19 1714  . cefTRIAXone (ROCEPHIN) 1 g in sodium chloride 0.9 % 100 mL IVPB  1 g Intravenous Q24H Smith, Rondell A, MD 200 mL/hr at 10/26/19 1627 1 g at 10/26/19 1627  . Chlorhexidine Gluconate Cloth 2 % PADS 6 each  6 each Topical Q0600 Roney Jaffe, MD   6 each at 10/27/19 [phone]  . chlorpheniramine-HYDROcodone (TUSSIONEX) 10-8 MG/5ML suspension 5 mL  5 mL Oral Q12H PRN Smith, Rondell A, MD      . clopidogrel (PLAVIX) tablet 75 mg  75 mg Oral QPM Smith, Rondell A, MD   75 mg at 10/26/19 1636  . dexamethasone (DECADRON) tablet 6 mg  6 mg Oral Q24H Smith, Rondell A, MD   6 mg at 10/27/19 1041  . famotidine (PEPCID) IVPB 20 mg premix  20 mg Intravenous Q24H Smith, Rondell A, MD 100 mL/hr at 10/27/19 1046 20 mg at 10/27/19 1046  . guaiFENesin-dextromethorphan (ROBITUSSIN DM) 100-10 MG/5ML syrup 10 mL  10 mL Oral Q4H PRN Fuller Plan A, MD      . heparin injection 5,000 Units  5,000 Units Subcutaneous Q8H Fuller Plan A, MD   5,000 Units at 10/27/19 [phone]  . heparin injection 9,000 Units  9,000 Units Dialysis Once in dialysis Roney Jaffe, MD      . insulin aspart (novoLOG) injection 0-9 Units  0-9 Units Subcutaneous Q4H Fuller Plan A, MD   1 Units at 10/27/19 0430  . nitroGLYCERIN (NITROSTAT) SL tablet 0.4 mg  0.4 mg Sublingual Q5 min PRN Fuller Plan A, MD      . ondansetron (ZOFRAN) tablet 4 mg  4 mg Oral Q6H PRN Fuller Plan A, MD       Or  . ondansetron (ZOFRAN) injection 4 mg  4 mg Intravenous Q6H PRN Smith, Rondell A, MD      . polyethylene glycol (MIRALAX / GLYCOLAX) packet 17 g  17 g Oral Daily PRN Smith,  Rondell A, MD      . polyvinyl alcohol (LIQUIFILM TEARS) 1.4 % ophthalmic solution 1 drop  1 drop Both Eyes PRN Fuller Plan A, MD   1 drop at 10/25/19 1313  . remdesivir 200 mg in sodium chloride 0.9 % 250 mL IVPB  200 mg Intravenous Once Fuller Plan A, MD       Followed by  . remdesivir 100 mg in sodium chloride 0.9 % 250 mL IVPB  100 mg Intravenous Q24H Harvel Quale, RPH 500 mL/hr at 10/26/19 1829 100 mg at 10/26/19 1829  . sevelamer carbonate (RENVELA) tablet 1,600 mg  1,600 mg Oral TID WC Smith, Rondell A, MD   1,600 mg at 10/27/19 1040  . sodium chloride flush (NS)  0.9 % injection 3 mL  3 mL Intravenous Q12H Smith, Rondell A, MD   3 mL at 10/26/19 1930  . sodium zirconium cyclosilicate (LOKELMA) packet 10 g  10 g Oral BID Alma Friendly, MD      . vitamin C (ASCORBIC ACID) tablet 500 mg  500 mg Oral Daily Fuller Plan A, MD   500 mg at 10/26/19 0910  . zinc sulfate capsule 220 mg  220 mg Oral Daily Fuller Plan A, MD   220 mg at 10/27/19 1040     Discharge Medications: Please see discharge summary for a list of discharge medications.  Relevant Imaging Results:  Relevant Lab Results:   Additional Information SSN: [id number]; HD at [place], [facility], S 12:00pm isolation shift  Evaleen Sant B Lubna Stegeman, LCSWA

## 2019-10-27 NOTE — Progress Notes (Signed)
RN updated [name] pt's daughter in law on plan of care and pt condition.

## 2019-10-28 DIAGNOSIS — N186 End stage renal disease: Secondary | ICD-10-CM

## 2019-10-28 DIAGNOSIS — Z7189 Other specified counseling: Secondary | ICD-10-CM

## 2019-10-28 DIAGNOSIS — Z515 Encounter for palliative care: Secondary | ICD-10-CM

## 2019-10-28 DIAGNOSIS — Z992 Dependence on renal dialysis: Secondary | ICD-10-CM

## 2019-10-28 LAB — FERRITIN: Ferritin: 3155 ng/mL — ABNORMAL HIGH (ref 11–307)

## 2019-10-28 LAB — CBC
HCT: 25.5 % — ABNORMAL LOW (ref 36.0–46.0)
Hemoglobin: 8.4 g/dL — ABNORMAL LOW (ref 12.0–15.0)
MCH: 30.2 pg (ref 26.0–34.0)
MCHC: 32.9 g/dL (ref 30.0–36.0)
MCV: 91.7 fL (ref 80.0–100.0)
Platelets: 237 10*3/uL (ref 150–400)
RBC: 2.78 MIL/uL — ABNORMAL LOW (ref 3.87–5.11)
RDW: 15.8 % — ABNORMAL HIGH (ref 11.5–15.5)
WBC: 8 10*3/uL (ref 4.0–10.5)
nRBC: 0 % (ref 0.0–0.2)

## 2019-10-28 LAB — GLUCOSE, CAPILLARY
Glucose-Capillary: 130 mg/dL — ABNORMAL HIGH (ref 70–99)
Glucose-Capillary: 122 mg/dL — ABNORMAL HIGH (ref 70–99)
Glucose-Capillary: 91 mg/dL (ref 70–99)
Glucose-Capillary: 103 mg/dL — ABNORMAL HIGH (ref 70–99)
Glucose-Capillary: 117 mg/dL — ABNORMAL HIGH (ref 70–99)
Glucose-Capillary: 182 mg/dL — ABNORMAL HIGH (ref 70–99)

## 2019-10-28 LAB — RENAL FUNCTION PANEL
Albumin: 3.2 g/dL — ABNORMAL LOW (ref 3.5–5.0)
Anion gap: 18 — ABNORMAL HIGH (ref 5–15)
BUN: 107 mg/dL — ABNORMAL HIGH (ref 8–23)
CO2: 19 mmol/L — ABNORMAL LOW (ref 22–32)
Calcium: 7.6 mg/dL — ABNORMAL LOW (ref 8.9–10.3)
Chloride: 92 mmol/L — ABNORMAL LOW (ref 98–111)
Creatinine, Ser: 10.89 mg/dL — ABNORMAL HIGH (ref 0.44–1.00)
GFR calc Af Amer: 4 mL/min — ABNORMAL LOW (ref >60)
GFR calc non Af Amer: 3 mL/min — ABNORMAL LOW (ref >60)
Glucose, Bld: 120 mg/dL — ABNORMAL HIGH (ref 70–99)
Phosphorus: 6.5 mg/dL — ABNORMAL HIGH (ref 2.5–4.6)
Potassium: 5.5 mmol/L — ABNORMAL HIGH (ref 3.5–5.1)
Sodium: 129 mmol/L — ABNORMAL LOW (ref 135–145)

## 2019-10-28 LAB — COMPREHENSIVE METABOLIC PANEL
ALT: 30 U/L (ref 0–44)
AST: 29 U/L (ref 15–41)
Albumin: 3.5 g/dL (ref 3.5–5.0)
Alkaline Phosphatase: 93 U/L (ref 38–126)
Anion gap: 21 — ABNORMAL HIGH (ref 5–15)
BUN: 100 mg/dL — ABNORMAL HIGH (ref 8–23)
CO2: 20 mmol/L — ABNORMAL LOW (ref 22–32)
Calcium: 7.7 mg/dL — ABNORMAL LOW (ref 8.9–10.3)
Chloride: 90 mmol/L — ABNORMAL LOW (ref 98–111)
Creatinine, Ser: 10.88 mg/dL — ABNORMAL HIGH (ref 0.44–1.00)
GFR calc Af Amer: 4 mL/min — ABNORMAL LOW (ref >60)
GFR calc non Af Amer: 3 mL/min — ABNORMAL LOW (ref >60)
Glucose, Bld: 142 mg/dL — ABNORMAL HIGH (ref 70–99)
Potassium: 4.9 mmol/L (ref 3.5–5.1)
Sodium: 131 mmol/L — ABNORMAL LOW (ref 135–145)
Total Bilirubin: 0.6 mg/dL (ref 0.3–1.2)
Total Protein: 7.6 g/dL (ref 6.5–8.1)

## 2019-10-28 LAB — CBC WITH DIFFERENTIAL/PLATELET
Abs Immature Granulocytes: 0.05 10*3/uL (ref 0.00–0.07)
Basophils Absolute: 0 10*3/uL (ref 0.0–0.1)
Basophils Relative: 0 %
Eosinophils Absolute: 0 10*3/uL (ref 0.0–0.5)
Eosinophils Relative: 0 %
HCT: 27.7 % — ABNORMAL LOW (ref 36.0–46.0)
Hemoglobin: 8.9 g/dL — ABNORMAL LOW (ref 12.0–15.0)
Immature Granulocytes: 1 %
Lymphocytes Relative: 11 %
Lymphs Abs: 1 10*3/uL (ref 0.7–4.0)
MCH: 30 pg (ref 26.0–34.0)
MCHC: 32.1 g/dL (ref 30.0–36.0)
MCV: 93.3 fL (ref 80.0–100.0)
Monocytes Absolute: 0.9 10*3/uL (ref 0.1–1.0)
Monocytes Relative: 10 %
Neutro Abs: 6.9 10*3/uL (ref 1.7–7.7)
Neutrophils Relative %: 78 %
Platelets: 247 10*3/uL (ref 150–400)
RBC: 2.97 MIL/uL — ABNORMAL LOW (ref 3.87–5.11)
RDW: 15.9 % — ABNORMAL HIGH (ref 11.5–15.5)
WBC: 8.8 10*3/uL (ref 4.0–10.5)
nRBC: 0.2 % (ref 0.0–0.2)

## 2019-10-28 LAB — PHOSPHORUS: Phosphorus: 6.2 mg/dL — ABNORMAL HIGH (ref 2.5–4.6)

## 2019-10-28 LAB — D-DIMER, QUANTITATIVE: D-Dimer, Quant: 1.96 ug/mL-FEU — ABNORMAL HIGH (ref 0.00–0.50)

## 2019-10-28 LAB — MAGNESIUM: Magnesium: 3 mg/dL — ABNORMAL HIGH (ref 1.7–2.4)

## 2019-10-28 LAB — C-REACTIVE PROTEIN: CRP: <0.8 mg/dL (ref <1.0)

## 2019-10-28 MED ORDER — HEPARIN SODIUM (PORCINE) 1000 UNIT/ML DIALYSIS: 100.0000 [IU]/kg | INTRAMUSCULAR | Status: DC | PRN

## 2019-10-28 MED ORDER — HEPARIN SODIUM (PORCINE) 1000 UNIT/ML IJ SOLN
INTRAMUSCULAR | Status: AC
  Filled 2019-10-28: qty 10

## 2019-10-28 NOTE — Consult Note (Signed)
Consultation Note Date: 10/28/2019   Patient Name: [name]  DOB: Jul 19, 1956  MRN: [id number]  Age / Sex: 63 y.o., female  PCP: Patient, No Pcp Per Referring Physician: Alma Friendly, MD  Reason for Consultation: Establishing goals of care and Psychosocial/spiritual support  HPI/Patient Profile: 63 y.o. 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  Patient reportedly sent from facility for worsening shortness of breath. She was tested and is COVID-19 positive.  She had shortness of breath with O2 saturations noted to be as low as 80% at the skilled nursing facility with improvement to 98% on 4 L nasal cannula oxygen. In the ED, O2 saturations currently maintained on 3 L of nasal cannula oxygen.  Labs significant for high-sensitivity troponin 3469. Chest x-ray noting interstitial edema or pneumonitis/chronic interstitial lung disease with mildly improved cardiomegaly. Cardiology was consulted.   Pt admitted for treatment and stabilization.  Family face treatment option decisions advanced directive decisions, and anticipatory care needs.     Clinical Assessment and Goals of Care:  This NP Wadie Lessen reviewed medical records, received report from team, and then spoke to son by telephone with son/Maurice Zamarripa  to discuss diagnosis, prognosis, GOC, EOL wishes disposition and options.  Concept of Hospice and Palliative Care were discussed  A discussion was had today regarding advanced directives.  Concepts specific to code status, artifical feeding and hydration, continued IV antibiotics and rehospitalization was had.    The difference between a aggressive medical intervention  path  and a palliative comfort care path for this patient at this time was had.  Values and goals of care important to patient and family were attempted to be elicited.  Family understand the seriousness of the current situation and the patient's high risk for decompensation.  We discussed human mortality and the limitations of medical interventions when a body begins to fail to thrive   Questions and concerns addressed.   Family encouraged to call with questions or concerns.    PMT will continue to support holistically.    HPOA is son Siera Beyersdorf    SUMMARY OF RECOMMENDATIONS    Code Status/Advance Care Planning:  Full code   Encouraged consideration of DNR/DNI knowing poor outcomes in resuscitation attempts in similar patients.   Palliative Prophylaxis:   Delirium Protocol, Frequent Pain Assessment and Oral Care  Additional Recommendations (Limitations, Scope, Preferences):  Full Scope Treatment   Family is open to all offered and available medical interventions to prolong life, they remain hopeful for Improvement.  Psycho-social/Spiritual:   Desire for further Chaplaincy support:no  Additional Recommendations: Created space and opportunity   Prognosis:   Unable to determine  Discharge Planning: To Be Determined      Primary Diagnoses: Present on Admission: . COVID-19 virus infection . Anemia in CKD (chronic kidney disease) .  Diabetes mellitus with ESRD (end-stage renal disease) ([place]) . ESRD (end stage renal disease) ([place]) . Dyslipidemia associated with type 2 diabetes mellitus ([place]) . Chronic combined systolic and diastolic congestive heart failure ([place]) . Acute respiratory failure with hypoxia ([place]) . PAD (peripheral artery disease) ([place])   I have reviewed the medical record, interviewed the patient and family, and examined the patient. The following aspects are pertinent.  Past Medical History:  Diagnosis Date  . Anemia    Due to fibroids.  .  Anemia in CKD (chronic kidney disease) 12/14/2014  . Benign hypertension with ESRD (end-stage renal disease) ([place]) 02/12/2018  . Bilateral leg edema    a. Chronic.  Marland Kitchen Blind   . Cardiac arrest ([place]) 12/12/2014  . CHF (congestive heart failure) ([place])   . Coronary artery disease   . Deaf    USE SIGN INTERPRETER.  Divorced from husband but lives with him. Has daughter but she does not care for her.  . Diabetes mellitus    a. Per PCP note 2012 (A1C 9.2) - pt unwilling to take meds and was educated on risk of uncontrolled DM.  b. A1C 5.9 in 11/2013.  Marland Kitchen ESRD on hemodialysis ([place])    Mon, Wed, Fri  . Essential hypertension 08/09/2009   Qualifier: Diagnosis of  By: Sarita Haver  MD, Coralyn Mark    . Fibroid uterus   . Heart murmur   . Hyperlipidemia   . Hypertension    a. Has previously refused blood pressure meds.   . Leiomyoma of uterus 08/09/2009   Qualifier: Diagnosis of  By: Sarita Haver  MD, Coralyn Mark    . MI (myocardial infarction) ([place]) 11/2014  . Moderate tricuspid regurgitation   . Osteomyelitis ([place])   . Psychiatric disorder    She frequently exhibits paranoia and has been diagnosed with psychotic d/o NOS during hospital stay in the past. She is tangetial and perseverative during her visits. She apparently has had a bad experience with mental health in [place] in the past and refuses to discuss mental issues for fear that she will be sent back there. Her paranoia, communication issues, financial woes and lack of fam  . Pulmonary hypertension ([place])    Social History   Socioeconomic History  . Marital status: Widowed    Spouse name: Not on file  . Number of children: Not on file  . Years of education: Not on file  . Highest education level: Not on file  Occupational History  . Not on file  Social Needs  . Financial resource strain: Not on file  . Food insecurity    Worry: Not on file    Inability: Not on file  . Transportation needs    Medical: Not on file    Non-medical: Not on file  Tobacco  Use  . Smoking status: Never Smoker  . Smokeless tobacco: Never Used  . Tobacco comment: Prior dip  Substance and Sexual Activity  . Alcohol use: No  . Drug use: No  . Sexual activity: Not on file  Lifestyle  . Physical activity    Days per week: Not on file    Minutes per session: Not on file  . Stress: Not on file  Relationships  . Social Herbalist on phone: Not on file    Gets together: Not on file    Attends religious service: Not on file    Active member of club or organization: Not on file    Attends meetings of clubs or  organizations: Not on file    Relationship status: Not on file  Other Topics Concern  . Not on file  Social History Narrative  . Not on file   Family History  Problem Relation Age of Onset  . Other Father        Drowned from fishing?  . Heart disease Other    Scheduled Meds: . albuterol  2 puff Inhalation Q6H  . amiodarone  200 mg Oral QPM  . amLODipine  5 mg Oral Daily  . ARIPiprazole  15 mg Oral Daily  . atorvastatin  10 mg Oral QPM  . Chlorhexidine Gluconate Cloth  6 each Topical Q0600  . clopidogrel  75 mg Oral QPM  . dexamethasone  6 mg Oral Q24H  . heparin      . heparin  5,000 Units Subcutaneous Q8H  . heparin  9,000 Units Dialysis Once in dialysis  . insulin aspart  0-9 Units Subcutaneous Q4H  . metoprolol succinate  25 mg Oral Daily  . sevelamer carbonate  1,600 mg Oral TID WC  . sodium chloride flush  3 mL Intravenous Q12H  . sodium zirconium cyclosilicate  10 g Oral BID  . vitamin C  500 mg Oral Daily  . zinc sulfate  220 mg Oral Daily   Continuous Infusions: . azithromycin Stopped (10/28/19 0721)  . cefTRIAXone (ROCEPHIN)  IV 1 g (10/27/19 1637)  . famotidine (PEPCID) IV 20 mg (10/27/19 1046)  . remdesivir 200 mg in NS 250 mL     Followed by  . remdesivir 100 mg in NS 250 mL 100 mg (10/27/19 1854)   PRN Meds:.acetaminophen, chlorpheniramine-HYDROcodone, guaiFENesin-dextromethorphan, heparin, nitroGLYCERIN,  ondansetron **OR** ondansetron (ZOFRAN) IV, polyethylene glycol, polyvinyl alcohol Medications Prior to Admission:  Prior to Admission medications   Medication Sig Start Date End Date Taking? Authorizing Provider  amiodarone (PACERONE) 200 MG tablet Take 200 mg by mouth every evening.    Yes [provider]  amLODipine (NORVASC) 5 MG tablet Take 5 mg by mouth daily.   Yes [provider]  ARIPiprazole (ABILIFY) 15 MG tablet Take 15 mg by mouth daily.  05/17/18  Yes [provider]  atorvastatin (LIPITOR) 10 MG tablet Take 10 mg by mouth every evening.   Yes [provider]  Cholecalciferol (VITAMIN D3) 50 MCG (2000 UT) capsule Take 2,000 Units by mouth daily.   Yes [provider]  clopidogrel (PLAVIX) 75 MG tablet Take 75 mg by mouth every evening.   Yes [provider]  Dextran 70-Hypromellose (ARTIFICIAL TEARS) 0.1-0.3 % SOLN Apply 1 drop to eye every 12 (twelve) hours as needed (dryness).    Yes [provider]  folic acid-vitamin b complex-vitamin c-selenium-zinc (DIALYVITE) 3 MG TABS tablet Take 1 tablet by mouth daily.   Yes [provider]  insulin lispro (ADMELOG) 100 UNIT/ML injection Inject 0-5 Units into the skin See admin instructions. Per Sliding Scale twice dialy: 0-150 0units 151-400 5units   Yes [provider]  nitroGLYCERIN (NITROSTAT) 0.4 MG SL tablet Place 0.4 mg under the tongue every 5 (five) minutes as needed for chest pain.   Yes [provider]  Nutritional Supplements (PROMOD) LIQD Take 60 mLs by mouth 2 (two) times daily. 05/07/18  Yes [provider]  ondansetron (ZOFRAN) 4 MG tablet Take 4 mg by mouth every 6 (six) hours as needed for nausea or vomiting.   Yes [provider]  polyethylene glycol (MIRALAX / GLYCOLAX) packet Take 17 g by mouth daily.  Yes [provider]  sevelamer (RENAGEL) 800 MG tablet Take 1,600 mg by mouth 3 (three) times daily with  meals.  09/03/18  Yes [provider]  traMADol (ULTRAM) 50 MG tablet Take 1 tablet (50 mg total) by mouth daily. 10/14/18  Yes [name], DO   No Known Allergies Review of Systems  Unable to perform ROS Neurological: Dizziness:      Physical Exam  Vital Signs: BP (!) (P) 146/66   Pulse (P) 82   Temp 97.8 F (36.6 C) (Oral)   Resp 18   Wt 100.9 kg   SpO2 98%   BMI 37.02 kg/m  Pain Scale: PAINAD   Pain Score: 0-No pain   SpO2: SpO2: 98 % O2 Device:SpO2: 98 % O2 Flow Rate: .O2 Flow Rate (L/min): 1 L/min  IO: Intake/output summary:   Intake/Output Summary (Last 24 hours) at 10/28/2019 1141 Last data filed at 10/28/2019 0400 Gross per 24 hour  Intake 1030 ml  Output -  Net 1030 ml    LBM: Last BM Date: 10/27/19 Baseline Weight: Weight: 96.9 kg Most recent weight: Weight: 100.9 kg     Palliative Assessment/Data:    The above conversation was completed via telephone due to the visitor restrictions during the COVID-19 pandemic. Thorough chart review and discussion with necessary members of the care team was completed as part of assessment. All issues were discussed and addressed but no physical exam was performed.  Discussed with bedside RN/Ben and Dr Earlie Counts  Recommend Palliative OP community based services to follow at facility  Time In: 1420 Time Out: 1530 Time Total: 70 minutes Greater than 50%  of this time was spent counseling and coordinating care related to the above assessment and plan.  Signed by: Wadie Lessen, NP   Please contact Palliative Medicine Team phone at [phone] for questions and concerns.  For individual provider: See Shea Evans

## 2019-10-28 NOTE — Progress Notes (Signed)
Received call from Interpreter services. [name] [facility] interpreter) will be at bedside with patient at 7:15am and 7:15pm every day  to provide translation for patient and healthcare team interactions.

## 2019-10-28 NOTE — Progress Notes (Signed)
PROGRESS NOTE  [name] UMP:[id number] DOB: [birth year] DOA: 10/25/2019 PCP: Patient, No Pcp Per  HPI/Recap of past 24 hours: HPI from Dr Juanetta Beets [name] is a 63 y.o. 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  Patient reportedly sent in for worsening shortness of breath. The patient son notes that she started having vomiting, and was told that one of the therapist had [place].  She was tested 3 days ago and results were noted to be positive.  Since that time patient had been complaining of shortness of breath with O2 saturations noted to be as low as 80% at the skilled nursing facility with improvement to 98% on 4 L nasal cannula oxygen. In the ED, O2 saturations currently maintained on 3 L of nasal cannula oxygen.  Labs significant for high-sensitivity troponin 3469.  Chest x-ray noting interstitial edema or pneumonitis/chronic interstitial lung disease with mildly improved cardiomegaly. Cardiology was consulted. Pt admitted for further management.     Today, patient looks comfortable, noted to be smiling.  Unable to perform ROS due to significant disability.      Assessment/Plan: Principal Problem:   COVID-19 virus infection Active Problems:   Acute respiratory failure with hypoxia (HCC)   Diabetes mellitus with ESRD (end-stage renal disease) (HCC)   Anemia in CKD (chronic kidney disease)   Blindness with deafness   PAD (peripheral artery disease) (HCC)   ESRD (end stage renal disease) (HCC)   Chronic combined systolic and diastolic congestive heart failure ([place])   Dyslipidemia associated with type 2 diabetes mellitus ([place])  Acute hypoxic respiratory failure likely 2/2 COVID-19 pneumonia/CAP Recently diagnosed with Covid on 10/28 per report Afebrile, with no leukocytosis Currently maintaining  saturation on room air, but prefers being on oxygen for comfort Inflammatory markers downtrending Procalcitonin elevated BC x2 NGTD Chest x-ray showed interstitial edema versus pneumonitis Continue dexamethasone, remdesivir Continue ceftriaxone, azithromycin for 5 days Albuterol inhaler, cough suppressants, vitamins Trend inflammatory markers  ??NSTEMI/history of CAD/anterior wall MI in 2016 Troponin elevated greater than 3000-> 2867 EKG with no acute ST changes Echo showed EF of 25 to 30%, with severely decreased LV function, grade 3 diastolic dysfunction Cardiology consulted, recommend Toprol instead of amlodipine as BP permits, stated elevated troponin is due to stress of Covid/pneumonia and would not work-up for other Continue Plavix, aspirin, nitroglycerin as needed Started toprol Continue to monitor  Combined acute on chronic systolic and diastolic HF Appears somewhat euvolemic BNP Recent echo as above with decreased EF 25 to 30% Cardiology on board Strict I's and O's, daily weight Continue amiodarone, continue toprol  ESRD on HD, oliguric with hyperkalemia Fluid management by nephrology Started on [facility] on board  Anemia of chronic kidney disease Hemoglobin at baseline Daily CBC  Hypertension Stable Continue amlodipine  Diabetes mellitus type 2 Last A1c 5.4 SSI, Accu-Cheks, hypoglycemic protocol  History of peripheral vascular disease Status post amputation of bilateral lower extremities Continue Plavix  Paranoia Continue Abilify  GOC Patient with multiple medical condition, also disabled (blind, deaf, dumb) Palliative consulted        Malnutrition Type:      Malnutrition Characteristics:      Nutrition Interventions:       Estimated body mass index is 35.37 kg/m as calculated from the following:   Height as of 09/25/18: 5'  5" (1.651 m).   Weight as of this encounter: 96.4 kg.     Code Status: Full   Family  Communication: None at bedside  Disposition Plan: TBD, likely back to SNF   Consultants:  Cardiology   Nephrology  Procedures:  None  Antimicrobials:  Ceftriaxone  Azithromycin  DVT prophylaxis: Heparin   Objective: Vitals:   10/28/19 1030 10/28/19 1100 10/28/19 1107 10/28/19 1200  BP: 131/71 (!) 146/66 135/73 131/75  Pulse: 80 82 80 78  Resp:   17 (!) 21  Temp:   97.7 F (36.5 C)   TempSrc:   Oral   SpO2:   100% 100%  Weight:   96.4 kg     Intake/Output Summary (Last 24 hours) at 10/28/2019 1440 Last data filed at 10/28/2019 1435 Gross per 24 hour  Intake 970 ml  Output 3000 ml  Net -2030 ml   Filed Weights   10/28/19 0500 10/28/19 0800 10/28/19 1107  Weight: 99 kg 100.9 kg 96.4 kg    Exam:  General: NAD, blind/deaf/dumb  Cardiovascular: S1, S2 present  Respiratory:  Bilateral crackles noted at the lung bases  Abdomen: Soft, nontender, nondistended, bowel sounds present  Musculoskeletal: No bilateral pedal edema noted, 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 noted  Skin: Normal  Psychiatry:  Unable to assess    Data Reviewed: CBC: Recent Labs  Lab 10/25/19 0641 10/26/19 0548 10/27/19 0436 10/28/19 0350 10/28/19 0700  WBC 8.7 4.4 5.7 8.8 8.0  NEUTROABS 6.7 3.6 4.7 6.9  --   HGB 9.9* 9.2* 8.8* 8.9* 8.4*  HCT 30.6* 28.1* 27.1* 27.7* 25.5*  MCV 95.6 93.7 93.4 93.3 91.7  PLT 241 210 225 247 967   Basic Metabolic Panel: Recent Labs  Lab 10/25/19 0641 10/26/19 0548 10/27/19 0436 10/28/19 0350 10/28/19 0700  NA 130* 136 134* 131* 129*  K 5.1 4.6 5.5* 4.9 5.5*  CL 86* 95* 93* 90* 92*  CO2 24 25 24  20* 19*  GLUCOSE 192* 147* 148* 142* 120*  BUN 81* 43* 77* 100* 107*  CREATININE 10.74* 7.11* 9.57* 10.88* 10.89*  CALCIUM 7.8* 8.0* 7.6* 7.7* 7.6*  MG  --  2.6* 2.7* 3.0*  --   PHOS  --  4.9* 4.4 6.2* 6.5*   GFR: CrCl cannot be calculated (Unknown ideal weight.). Liver Function Tests: Recent Labs  Lab 10/25/19  1159 10/26/19 0548 10/27/19 0436 10/28/19 0350 10/28/19 0700  AST 31 22 36 29  --   ALT 20 16 29 30   --   ALKPHOS 94 90 98 93  --   BILITOT 0.7 0.5 0.6 0.6  --   PROT 7.4 7.7 7.4 7.6  --   ALBUMIN 3.4* 3.5 3.4* 3.5 3.2*   No results for input(s): LIPASE, AMYLASE in the last 168 hours. No results for input(s): AMMONIA in the last 168 hours. Coagulation Profile: No results for input(s): INR, PROTIME in the last 168 hours. Cardiac Enzymes: No results for input(s): CKTOTAL, CKMB, CKMBINDEX, TROPONINI in the last 168 hours. BNP (last 3 results) No results for input(s): PROBNP in the last 8760 hours. HbA1C: Recent Labs    10/26/19 1009  HGBA1C 5.4   CBG: Recent Labs  Lab 10/27/19 2032 10/27/19 2302 10/28/19 0351 10/28/19 0811 10/28/19 1249  GLUCAP 193* 215* 130* 122* 91   Lipid Profile: Recent Labs    10/26/19 0548  CHOL 126  HDL 49  LDLCALC 70  TRIG 35  CHOLHDL 2.6   Thyroid Function Tests: No results  for input(s): TSH, T4TOTAL, FREET4, T3FREE, THYROIDAB in the last 72 hours. Anemia Panel: Recent Labs    10/27/19 0436 10/28/19 0350  FERRITIN 3,851* 3,155*   Urine analysis:    Component Value Date/Time   COLORURINE AMBER (A) 03/27/2016 0810   APPEARANCEUR CLOUDY (A) 03/27/2016 0810   LABSPEC 1.015 03/27/2016 0810   PHURINE 5.5 03/27/2016 0810   GLUCOSEU NEGATIVE 03/27/2016 0810   HGBUR SMALL (A) 03/27/2016 0810   BILIRUBINUR SMALL (A) 03/27/2016 0810   KETONESUR 15 (A) 03/27/2016 0810   PROTEINUR >300 (A) 03/27/2016 0810   UROBILINOGEN 0.2 01/13/2015 0953   NITRITE NEGATIVE 03/27/2016 0810   LEUKOCYTESUR LARGE (A) 03/27/2016 0810   Sepsis Labs: @LABRCNTIP (procalcitonin:4,lacticidven:4)  ) Recent Results (from the past 240 hour(s))  Culture, blood (routine x 2)     Status: None (Preliminary result)   Collection Time: 10/25/19  5:05 PM   Specimen: BLOOD RIGHT FOREARM  Result Value Ref Range Status   Specimen Description BLOOD RIGHT FOREARM   Final   Special Requests   Final    BOTTLES DRAWN AEROBIC AND ANAEROBIC Blood Culture adequate volume   Culture   Final    NO GROWTH 3 DAYS Performed at [facility], [place] [address], [place], [place] [address]    Report Status PENDING  Incomplete  Culture, blood (routine x 2)     Status: None (Preliminary result)   Collection Time: 10/25/19  5:14 PM   Specimen: BLOOD RIGHT HAND  Result Value Ref Range Status   Specimen Description BLOOD RIGHT HAND  Final   Special Requests   Final    BOTTLES DRAWN AEROBIC AND ANAEROBIC Blood Culture adequate volume   Culture   Final    NO GROWTH 3 DAYS Performed at [facility], [place] [address], [place], [place] [address]    Report Status PENDING  Incomplete  MRSA PCR Screening     Status: None   Collection Time: 10/25/19 11:25 PM   Specimen: Nasopharyngeal  Result Value Ref Range Status   MRSA by PCR NEGATIVE NEGATIVE Final    Comment:        The GeneXpert MRSA Assay (FDA approved for NASAL specimens only), is one component of a comprehensive MRSA colonization surveillance program. It is not intended to diagnose MRSA infection nor to guide or monitor treatment for MRSA infections. Performed at [facility], [place] [address], [place], [place] [address]       Studies: No results found.  Scheduled Meds: . albuterol  2 puff Inhalation Q6H  . amiodarone  200 mg Oral QPM  . amLODipine  5 mg Oral Daily  . ARIPiprazole  15 mg Oral Daily  . atorvastatin  10 mg Oral QPM  . Chlorhexidine Gluconate Cloth  6 each Topical Q0600  . clopidogrel  75 mg Oral QPM  . dexamethasone  6 mg Oral Q24H  . heparin      . heparin  5,000 Units Subcutaneous Q8H  . heparin  9,000 Units Dialysis Once in dialysis  . insulin aspart  0-9 Units Subcutaneous Q4H  . metoprolol succinate  25 mg Oral Daily  . sevelamer carbonate  1,600 mg Oral TID WC  . sodium chloride flush  3 mL Intravenous Q12H  . sodium zirconium cyclosilicate  10 g Oral BID   . vitamin C  500 mg Oral Daily  . zinc sulfate  220 mg Oral Daily    Continuous Infusions: . azithromycin Stopped (10/28/19 0721)  .  cefTRIAXone (ROCEPHIN)  IV 1 g (10/27/19 1637)  . famotidine (PEPCID) IV 20 mg (10/28/19 1257)  . remdesivir 200 mg in NS 250 mL     Followed by  . remdesivir 100 mg in NS 250 mL 100 mg (10/27/19 1854)     LOS: 3 days     Alma Friendly, MD [facility]  If 7PM-7AM, please contact night-coverage [email or web address] 10/28/2019, 2:40 PM

## 2019-10-28 NOTE — Progress Notes (Signed)
Patient ID: [name], female   DOB: November 20, 1956, 63 y.o.   MRN: [id number] S: No events overnight.   O:BP (!) (P) 146/66   Pulse (P) 82   Temp 97.8 F (36.6 C) (Oral)   Resp 18   Wt 100.9 kg   SpO2 98%   BMI 37.02 kg/m   Intake/Output Summary (Last 24 hours) at 10/28/2019 1158 Last data filed at 10/28/2019 0400 Gross per 24 hour  Intake 1030 ml  Output -  Net 1030 ml   Intake/Output: I/O last 3 completed shifts: In: 9211 [P.O.:1280; IV Piggyback:250] Out: 200 [Urine:200]  Intake/Output this shift:  No intake/output data recorded. Weight change: 2.4 kg Physical exam: unable to complete due to COVID + status.  In order to preserve PPE equipment and to minimize exposure to providers.  Notes from other caregivers reviewed   Recent Labs  Lab 10/25/19 0641 10/25/19 1159 10/26/19 0548 10/27/19 0436 10/28/19 0350 10/28/19 0700  NA 130*  --  136 134* 131* 129*  K 5.1  --  4.6 5.5* 4.9 5.5*  CL 86*  --  95* 93* 90* 92*  CO2 24  --  25 24 20* 19*  GLUCOSE 192*  --  147* 148* 142* 120*  BUN 81*  --  43* 77* 100* 107*  CREATININE 10.74*  --  7.11* 9.57* 10.88* 10.89*  ALBUMIN  --  3.4* 3.5 3.4* 3.5 3.2*  CALCIUM 7.8*  --  8.0* 7.6* 7.7* 7.6*  PHOS  --   --  4.9* 4.4 6.2* 6.5*  AST  --  31 22 36 29  --   ALT  --  20 16 29 30   --    Liver Function Tests: Recent Labs  Lab 10/26/19 0548 10/27/19 0436 10/28/19 0350 10/28/19 0700  AST 22 36 29  --   ALT 16 29 30   --   ALKPHOS 90 98 93  --   BILITOT 0.5 0.6 0.6  --   PROT 7.7 7.4 7.6  --   ALBUMIN 3.5 3.4* 3.5 3.2*   No results for input(s): LIPASE, AMYLASE in the last 168 hours. No results for input(s): AMMONIA in the last 168 hours. CBC: Recent Labs  Lab 10/25/19 0641 10/26/19 0548 10/27/19 0436 10/28/19 0350 10/28/19 0700  WBC 8.7 4.4 5.7 8.8 8.0  NEUTROABS 6.7 3.6 4.7 6.9  --   HGB 9.9* 9.2* 8.8* 8.9* 8.4*  HCT 30.6* 28.1* 27.1* 27.7* 25.5*  MCV 95.6 93.7 93.4 93.3 91.7  PLT 241 210 225 247 237    Cardiac Enzymes: No results for input(s): CKTOTAL, CKMB, CKMBINDEX, TROPONINI in the last 168 hours. CBG: Recent Labs  Lab 10/27/19 1824 10/27/19 2032 10/27/19 2302 10/28/19 0351 10/28/19 0811  GLUCAP 202* 193* 215* 130* 122*    Iron Studies:  Recent Labs    10/28/19 0350  FERRITIN 3,155*   Studies/Results: No results found. Marland Kitchen albuterol  2 puff Inhalation Q6H  . amiodarone  200 mg Oral QPM  . amLODipine  5 mg Oral Daily  . ARIPiprazole  15 mg Oral Daily  . atorvastatin  10 mg Oral QPM  . Chlorhexidine Gluconate Cloth  6 each Topical Q0600  . clopidogrel  75 mg Oral QPM  . dexamethasone  6 mg Oral Q24H  . heparin      . heparin  5,000 Units Subcutaneous Q8H  . heparin  9,000 Units Dialysis Once in dialysis  . insulin aspart  0-9 Units Subcutaneous Q4H  . metoprolol succinate  25 mg Oral Daily  . sevelamer carbonate  1,600 mg Oral TID WC  . sodium chloride flush  3 mL Intravenous Q12H  . sodium zirconium cyclosilicate  10 g Oral BID  . vitamin C  500 mg Oral Daily  . zinc sulfate  220 mg Oral Daily    BMET    Component Value Date/Time   NA 129 (L) 10/28/2019 0700   NA 138 04/23/2018   K 5.5 (H) 10/28/2019 0700   CL 92 (L) 10/28/2019 0700   CO2 19 (L) 10/28/2019 0700   GLUCOSE 120 (H) 10/28/2019 0700   BUN 107 (H) 10/28/2019 0700   BUN 38 (A) 04/23/2018   CREATININE 10.89 (H) 10/28/2019 0700   CREATININE 0.76 05/23/2011 0956   CALCIUM 7.6 (L) 10/28/2019 0700   GFRNONAA 3 (L) 10/28/2019 0700   GFRAA 4 (L) 10/28/2019 0700   CBC    Component Value Date/Time   WBC 8.0 10/28/2019 0700   RBC 2.78 (L) 10/28/2019 0700   HGB 8.4 (L) 10/28/2019 0700   HCT 25.5 (L) 10/28/2019 0700   HCT 21.3 (L) 05/01/2018 0223   PLT 237 10/28/2019 0700   MCV 91.7 10/28/2019 0700   MCH 30.2 10/28/2019 0700   MCHC 32.9 10/28/2019 0700   RDW 15.8 (H) 10/28/2019 0700   LYMPHSABS 1.0 10/28/2019 0350   MONOABS 0.9 10/28/2019 0350   EOSABS 0.0 10/28/2019 0350   BASOSABS 0.0  10/28/2019 0350   Outpt HD:GO TTS 4h 2/2.25 94.5kg Hep 9000 LUA AVF  - mircera 60 q2wks, last 10/26 - hect 3 - parsabiv 5 - renvela 2 ac, no BP meds, usual wt gain 3-4kg  Assessment/Plan:  1. Covid+ PNA- with hypoxia.  Will cont with HD and UF as bp tolerates.  2. ESRD- cont with TTS schedule and UF as bp tolerates 3. Hyperkalemia- will give lokelma and plan for HD today. 4. HTN/Volume- stable will challenge edw. 5. IDDM 6. Deaf/blind 7. CAD  8. Pulmonary HTN [name] Potts, [name] [facility] [phone]

## 2019-10-28 NOTE — Progress Notes (Signed)
Interpreter came to see pt today. Pt told the interpreter that she had been cold all day and was unsure of how to explain that to the staff. She also stated that she only she feels short of breath when she has coughing fits and that the cold air in the room was making her cough more. She said she does not have any pain or discomfort and no further concerns.  I had the interpreter explain what fluid restrictions mean and how much more she was able to have. The interpreter informed me that they are still trying to find a consistent interpreter.

## 2019-10-28 NOTE — Progress Notes (Signed)
Interpreter came to visit the pt today. The pt stated that she was fine and feeling a lot better. Pt stated she had no shortness of breathe and was not coughing as much. She did not have any questions or concerns. She stated she didn't like her lunch and was unable to eat anything during the day because she couldn't tell the nurse. I was able to get her dinner and she was happy with the food. Interpreter stated she will be back in the morning to interpret for the day shift nurse at 0715am.

## 2019-10-29 DIAGNOSIS — N186 End stage renal disease: Secondary | ICD-10-CM

## 2019-10-29 DIAGNOSIS — Z992 Dependence on renal dialysis: Secondary | ICD-10-CM

## 2019-10-29 DIAGNOSIS — Z515 Encounter for palliative care: Secondary | ICD-10-CM

## 2019-10-29 LAB — CBC WITH DIFFERENTIAL/PLATELET
Abs Immature Granulocytes: 0.05 10*3/uL (ref 0.00–0.07)
Basophils Absolute: 0 10*3/uL (ref 0.0–0.1)
Basophils Relative: 0 %
Eosinophils Absolute: 0 10*3/uL (ref 0.0–0.5)
Eosinophils Relative: 0 %
HCT: 24.4 % — ABNORMAL LOW (ref 36.0–46.0)
Hemoglobin: 8.2 g/dL — ABNORMAL LOW (ref 12.0–15.0)
Immature Granulocytes: 1 %
Lymphocytes Relative: 11 %
Lymphs Abs: 0.8 10*3/uL (ref 0.7–4.0)
MCH: 30.7 pg (ref 26.0–34.0)
MCHC: 33.6 g/dL (ref 30.0–36.0)
MCV: 91.4 fL (ref 80.0–100.0)
Monocytes Absolute: 0.7 10*3/uL (ref 0.1–1.0)
Monocytes Relative: 11 %
Neutro Abs: 5.4 10*3/uL (ref 1.7–7.7)
Neutrophils Relative %: 77 %
Platelets: 218 10*3/uL (ref 150–400)
RBC: 2.67 MIL/uL — ABNORMAL LOW (ref 3.87–5.11)
RDW: 15.9 % — ABNORMAL HIGH (ref 11.5–15.5)
WBC: 6.9 10*3/uL (ref 4.0–10.5)
nRBC: 0 % (ref 0.0–0.2)

## 2019-10-29 LAB — D-DIMER, QUANTITATIVE: D-Dimer, Quant: 1.33 ug/mL-FEU — ABNORMAL HIGH (ref 0.00–0.50)

## 2019-10-29 LAB — FERRITIN: Ferritin: 2093 ng/mL — ABNORMAL HIGH (ref 11–307)

## 2019-10-29 LAB — COMPREHENSIVE METABOLIC PANEL
ALT: 33 U/L (ref 0–44)
AST: 23 U/L (ref 15–41)
Albumin: 3.1 g/dL — ABNORMAL LOW (ref 3.5–5.0)
Alkaline Phosphatase: 92 U/L (ref 38–126)
Anion gap: 17 — ABNORMAL HIGH (ref 5–15)
BUN: 75 mg/dL — ABNORMAL HIGH (ref 8–23)
CO2: 22 mmol/L (ref 22–32)
Calcium: 7.2 mg/dL — ABNORMAL LOW (ref 8.9–10.3)
Chloride: 92 mmol/L — ABNORMAL LOW (ref 98–111)
Creatinine, Ser: 7.73 mg/dL — ABNORMAL HIGH (ref 0.44–1.00)
GFR calc Af Amer: 6 mL/min — ABNORMAL LOW (ref >60)
GFR calc non Af Amer: 5 mL/min — ABNORMAL LOW (ref >60)
Glucose, Bld: 137 mg/dL — ABNORMAL HIGH (ref 70–99)
Potassium: 4.5 mmol/L (ref 3.5–5.1)
Sodium: 131 mmol/L — ABNORMAL LOW (ref 135–145)
Total Bilirubin: 0.3 mg/dL (ref 0.3–1.2)
Total Protein: 6.7 g/dL (ref 6.5–8.1)

## 2019-10-29 LAB — MAGNESIUM: Magnesium: 2.6 mg/dL — ABNORMAL HIGH (ref 1.7–2.4)

## 2019-10-29 LAB — GLUCOSE, CAPILLARY
Glucose-Capillary: 147 mg/dL — ABNORMAL HIGH (ref 70–99)
Glucose-Capillary: 156 mg/dL — ABNORMAL HIGH (ref 70–99)

## 2019-10-29 LAB — PHOSPHORUS: Phosphorus: 6.8 mg/dL — ABNORMAL HIGH (ref 2.5–4.6)

## 2019-10-29 LAB — C-REACTIVE PROTEIN: CRP: <0.8 mg/dL (ref <1.0)

## 2019-10-29 MED ORDER — TRAMADOL HCL 50 MG PO TABS: 50.0000 mg | ORAL_TABLET | Freq: Every day | ORAL | 0 refills | Status: AC

## 2019-10-29 MED ORDER — DEXAMETHASONE 6 MG PO TABS: 6.0000 mg | ORAL_TABLET | ORAL | 0 refills | Status: AC

## 2019-10-29 MED ORDER — ALBUTEROL SULFATE HFA 108 (90 BASE) MCG/ACT IN AERS: 2.0000 | INHALATION_SPRAY | Freq: Four times a day (QID) | RESPIRATORY_TRACT | 0 refills | Status: AC

## 2019-10-29 MED ORDER — ASCORBIC ACID 500 MG PO TABS: 500.0000 mg | ORAL_TABLET | Freq: Every day | ORAL | 0 refills | Status: AC

## 2019-10-29 MED ORDER — CEFDINIR 300 MG PO CAPS: 300.0000 mg | ORAL_CAPSULE | Freq: Two times a day (BID) | ORAL | 0 refills | Status: AC

## 2019-10-29 MED ORDER — METOPROLOL SUCCINATE ER 25 MG PO TB24: 25.0000 mg | ORAL_TABLET | Freq: Every day | ORAL | 0 refills | Status: AC

## 2019-10-29 MED ORDER — ZINC SULFATE 220 (50 ZN) MG PO CAPS: 220.0000 mg | ORAL_CAPSULE | Freq: Every day | ORAL | 0 refills | Status: AC

## 2019-10-29 MED ORDER — AZITHROMYCIN 250 MG PO TABS: ORAL_TABLET | ORAL | 0 refills | Status: AC

## 2019-10-29 NOTE — TOC Progression Note (Signed)
Transition of Care [facility]) - Progression Note    Patient Details  Name: [name] MRN: [id number] Date of Birth: 03-Feb-1956  Transition of Care [facility]) CM/SW [place], [place] Phone Number: [phone] 10/29/2019, 3:16 PM  Clinical Narrative:     CSW reached out to [facility] after receiving message from MD that patient medically stable to DC. [facility] are assessing their bed availability and will call CSW back to inform if they can admit today or tomorrow.   Expected Discharge Plan: [place] Barriers to Discharge: Continued Medical Work up  Expected Discharge Plan and Services Expected Discharge Plan: [place] In-house Referral: Clinical Social Work Discharge Planning Services: NA Post Acute Care Choice: [place] Living arrangements for the past 2 months: [place]                 DME Arranged: N/A DME Agency: NA       HH Arranged: NA HH Agency: NA         Social Determinants of Health (SDOH) Interventions    Readmission Risk Interventions No flowsheet data found.

## 2019-10-29 NOTE — Progress Notes (Signed)
Interpreter came to visit patient this am. Patient had no c/o pain or shortness of breath.  Alert and oriented x4.  Slept well last night and only concern was discharge.  RN explained to patient that MD needs to round before making any decisions about discharges and as of right now no discharge order was in.  Patient and interpreter agreed on breakfast and lunch.  MD paged that interpreter was here and was only able to stay for short period of time.  Will continue to monitor.

## 2019-10-29 NOTE — TOC Transition Note (Signed)
Transition of Care [facility]) - CM/SW Discharge Note   Patient Details  Name: ELEN ACERO MRN: [id number] Date of Birth: 1956-08-20  Transition of Care [facility]) CM/SW Contact:  Alberteen Sam, LCSW Phone Number: 10/29/2019, 4:02 PM   Clinical Narrative:     Patient will DC to: [place] Anticipated DC date: 10/29/2019 Family notified: [name] Transport MC:[facility]  Per MD patient ready for DC to [facility] . RN, patient, patient's family, and facility notified of DC. Discharge Summary sent to facility. RN given number for report  [phone] Room 109. DC packet on chart. Ambulance transport requested for patient.  CSW signing off.  [place], [place] [phone]   Final next level of care: Skilled Nursing Facility Barriers to Discharge: No Barriers Identified   Patient Goals and CMS Choice Patient states their goals for this hospitalization and ongoing recovery are:: Pt son would like his mother to get better before returning to SNF. CMS Medicare.gov Compare Post Acute Care list provided to:: Patient Choice offered to / list presented to : Patient  Discharge Placement PASRR number recieved: 10/27/19            Patient chooses bed at: Other - please specify in the comment section below:([place]) Patient to be transferred to facility by: [facility] Name of family member notified: [name] Patient and family notified of of transfer: 10/29/19  Discharge Plan and Services In-house Referral: Clinical Social Work Discharge Planning Services: NA Post Acute Care Choice: [place]          DME Arranged: N/A DME Agency: NA       HH Arranged: NA HH Agency: NA        Social Determinants of Health (SDOH) Interventions     Readmission Risk Interventions No flowsheet data found.

## 2019-10-29 NOTE — Progress Notes (Signed)
Renal Navigator notified OP HD clinic/[facility] to inform that patient will be at iso shift tomorrow for tx.  Renal Navigator notified Allison/Arthur Gardiner Ramus to ensure that she is aware of iso shift seat schedule and that patient will start tomorrow. Patient is cleared for discharge from an OP HD standpoint.  Alphonzo Cruise, [place] Renal Navigator [phone]

## 2019-10-29 NOTE — Progress Notes (Signed)
Called [place] and talked to [place] about the patient is now being transferred by transport and she needs to have a tray waiting because she did not get a chance to eat.

## 2019-10-29 NOTE — Progress Notes (Signed)
Patient ID: [name], female   DOB: [birth year], 63 y.o.   MRN: [id number] S:No events overnight and tolerated HD O:BP (!) 114/57 (BP Location: Right Arm)   Pulse 73   Temp 98.8 F (37.1 C) (Axillary)   Resp 14   Wt 98.5 kg   SpO2 100%   BMI 36.14 kg/m   Intake/Output Summary (Last 24 hours) at 10/29/2019 1022 Last data filed at 10/29/2019 0000 Gross per 24 hour  Intake 720 ml  Output 3000 ml  Net -2280 ml   Intake/Output: I/O last 3 completed shifts: In: 1450 [P.O.:1200; IV Piggyback:250] Out: 3000 [Other:3000]  Intake/Output this shift:  No intake/output data recorded. Weight change: 1.9 kg Physical exam: unable to complete due to COVID + status.  In order to preserve PPE equipment and to minimize exposure to providers.  Notes from other caregivers reviewed   Recent Labs  Lab 10/25/19 0641 10/25/19 1159 10/26/19 0548 10/27/19 0436 10/28/19 0350 10/28/19 0700 10/29/19 0345  NA 130*  --  136 134* 131* 129* 131*  K 5.1  --  4.6 5.5* 4.9 5.5* 4.5  CL 86*  --  95* 93* 90* 92* 92*  CO2 24  --  25 24 20* 19* 22  GLUCOSE 192*  --  147* 148* 142* 120* 137*  BUN 81*  --  43* 77* 100* 107* 75*  CREATININE 10.74*  --  7.11* 9.57* 10.88* 10.89* 7.73*  ALBUMIN  --  3.4* 3.5 3.4* 3.5 3.2* 3.1*  CALCIUM 7.8*  --  8.0* 7.6* 7.7* 7.6* 7.2*  PHOS  --   --  4.9* 4.4 6.2* 6.5* 6.8*  AST  --  31 22 36 29  --  23  ALT  --  20 16 29 30   --  33   Liver Function Tests: Recent Labs  Lab 10/27/19 0436 10/28/19 0350 10/28/19 0700 10/29/19 0345  AST 36 29  --  23  ALT 29 30  --  33  ALKPHOS 98 93  --  92  BILITOT 0.6 0.6  --  0.3  PROT 7.4 7.6  --  6.7  ALBUMIN 3.4* 3.5 3.2* 3.1*   No results for input(s): LIPASE, AMYLASE in the last 168 hours. No results for input(s): AMMONIA in the last 168 hours. CBC: Recent Labs  Lab 10/26/19 0548 10/27/19 0436 10/28/19 0350 10/28/19 0700 10/29/19 0345  WBC 4.4 5.7 8.8 8.0 6.9  NEUTROABS 3.6 4.7 6.9  --  5.4  HGB 9.2* 8.8* 8.9*  8.4* 8.2*  HCT 28.1* 27.1* 27.7* 25.5* 24.4*  MCV 93.7 93.4 93.3 91.7 91.4  PLT 210 225 247 237 218   Cardiac Enzymes: No results for input(s): CKTOTAL, CKMB, CKMBINDEX, TROPONINI in the last 168 hours. CBG: Recent Labs  Lab 10/28/19 1249 10/28/19 1621 10/28/19 1926 10/28/19 2308 10/29/19 0345  GLUCAP 91 103* 117* 182* 147*    Iron Studies:  Recent Labs    10/28/19 0350  FERRITIN 3,155*   Studies/Results: No results found. Marland Kitchen albuterol  2 puff Inhalation Q6H  . amiodarone  200 mg Oral QPM  . amLODipine  5 mg Oral Daily  . ARIPiprazole  15 mg Oral Daily  . atorvastatin  10 mg Oral QPM  . Chlorhexidine Gluconate Cloth  6 each Topical Q0600  . clopidogrel  75 mg Oral QPM  . dexamethasone  6 mg Oral Q24H  . heparin  5,000 Units Subcutaneous Q8H  . heparin  9,000 Units Dialysis Once in dialysis  . insulin  aspart  0-9 Units Subcutaneous Q4H  . metoprolol succinate  25 mg Oral Daily  . sevelamer carbonate  1,600 mg Oral TID WC  . sodium chloride flush  3 mL Intravenous Q12H  . sodium zirconium cyclosilicate  10 g Oral BID  . vitamin C  500 mg Oral Daily  . zinc sulfate  220 mg Oral Daily    BMET    Component Value Date/Time   NA 131 (L) 10/29/2019 0345   NA 138 04/23/2018   K 4.5 10/29/2019 0345   CL 92 (L) 10/29/2019 0345   CO2 22 10/29/2019 0345   GLUCOSE 137 (H) 10/29/2019 0345   BUN 75 (H) 10/29/2019 0345   BUN 38 (A) 04/23/2018   CREATININE 7.73 (H) 10/29/2019 0345   CREATININE 0.76 05/23/2011 0956   CALCIUM 7.2 (L) 10/29/2019 0345   GFRNONAA 5 (L) 10/29/2019 0345   GFRAA 6 (L) 10/29/2019 0345   CBC    Component Value Date/Time   WBC 6.9 10/29/2019 0345   RBC 2.67 (L) 10/29/2019 0345   HGB 8.2 (L) 10/29/2019 0345   HCT 24.4 (L) 10/29/2019 0345   HCT 21.3 (L) 05/01/2018 0223   PLT 218 10/29/2019 0345   MCV 91.4 10/29/2019 0345   MCH 30.7 10/29/2019 0345   MCHC 33.6 10/29/2019 0345   RDW 15.9 (H) 10/29/2019 0345   LYMPHSABS 0.8 10/29/2019 0345    MONOABS 0.7 10/29/2019 0345   EOSABS 0.0 10/29/2019 0345   BASOSABS 0.0 10/29/2019 0345    Outpt HD:GO TTS 4h 2/2.25 94.5kg Hep 9000 LUA AVF  - mircera 60 q2wks, last 10/26 - hect 3 - parsabiv 5 - renvela 2 ac, no BP meds, usual wt gain 3-4kg  Assessment/Plan:  1. Covid+ PNA- with hypoxia.  Now with good oxygenation on room air and no SOB.  2. ESRD- cont with TTS schedule and UF as bp tolerates 3. Hyperkalemia- will give lokelma and plan for HD today. 4. HTN/Volume- stable will challenge edw. 5. IDDM 6. Deaf/blind 7. CAD  8. Pulmonary HTN 9. Disposition- per primary svc but now off of supplemental oxygen so hopefully closer to discharge.   Donetta Potts, MD [employer] [phone]

## 2019-10-29 NOTE — Discharge Summary (Signed)
Physician Discharge Summary  [name] HEN:[id number] DOB: October 27, 1956 DOA: 10/25/2019  PCP: Patient, No Pcp Per  Admit date: 10/25/2019 Discharge date: 10/29/2019  Admitted From: [facility] Disposition:  [facility]  Recommendations for Outpatient Follow-up:  1. Follow up with PCP in 1-2 weeks 2. 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 The patient son notes that she started having vomiting,and was told thatone of the therapist had [place].She was tested 3 days ago and results were noted to be positive. Since that time patient had been complaining of shortness of breath with O2 saturations noted to be as low as 80% at the skilled nursing facility with improvement to 98% on 4 L nasal cannula oxygen. In the ED, O2 saturations currently maintained on 3 L of nasal cannula oxygen.Labs significant for high-sensitivity troponin 3469. Chest x-ray noting interstitial edema or pneumonitis/chronic interstitial lung disease with mildly improved cardiomegaly. Cardiology was consulted. Pt admitted for further management.  Discharge Diagnoses:  Principal Problem:   COVID-19 virus infection Active Problems:   Acute respiratory failure with hypoxia (HCC)   Diabetes mellitus with ESRD (end-stage renal disease) ([place])   Anemia in CKD (chronic kidney disease)   Blindness with deafness   PAD (peripheral artery disease) (HCC)   ESRD (end stage renal disease) (HCC)   Chronic combined systolic and diastolic congestive heart failure ([place])   Dyslipidemia associated with type 2 diabetes  mellitus ([place])   Acute hypoxic respiratory failure likely 2/2 COVID-19 pneumonia/CAP -Recently diagnosed with Covid on 10/28 -Remains afebrile -Remains on room air -Inflammatory markers downtrending -BC x2 NGTD -Completed course of remdesivir -To complete total of 10 days of dexamethasone, 5 more days remain after d/c -Complete 2 more days of azithromycin and omnicef -Albuterol inhaler, cough suppressants, vitamins  ??NSTEMI/history of CAD/anterior wall MI in 2016 -Troponin elevated greater than 3000-> 2867 -EKG with no acute ST changes -Echo showed EF of 25 to 30%, with severely decreased LV function, grade 3 diastolic dysfunction -Cardiology consulted, recommend Toprol instead of amlodipine as BP permits, stated elevated troponin is due to stress of Covid/pneumonia and would not work-up for other -Continue Plavix, aspirin, nitroglycerin as needed -Started toprol  Combined acute on chronic systolic and diastolic HF -Appears somewhat euvolemic -Recent echo as above with decreased EF 25 to 30% -Seen by Cardiology, since signed off -Continue amiodarone, continue toprol  ESRD on HD, oliguric with hyperkalemia -Fluid management by nephrology -Nephrology on board -Continue scheduled HD  Anemia of chronic kidney disease -Hemoglobin at baseline  Hypertension -Stable -Continue amlodipine  Diabetes mellitus type 2 -Last A1c 5.4 -Cont ssi coverage  History of peripheral vascular disease -Status post amputation of bilateral lower extremities -Continue Plavix  Paranoia -Continue Abilify  GOC -Patient with multiple medical condition, also disabled -Palliative consulted, recommendation for Palliative Care referral at facility   Discharge Instructions   Allergies as of 10/29/2019   No Known Allergies     Medication List    TAKE these medications   Admelog 100 UNIT/ML injection Generic drug: insulin lispro Inject 0-5 Units into the skin See admin  instructions. Per Sliding Scale twice dialy: 0-150 0units 151-400 5units   albuterol 108 (90 Base)  MCG/ACT inhaler Commonly known as: VENTOLIN HFA Inhale 2 puffs into the lungs every 6 (six) hours.   amiodarone 200 MG tablet Commonly known as: PACERONE Take 200 mg by mouth every evening.   amLODipine 5 MG tablet Commonly known as: NORVASC Take 5 mg by mouth daily.   ARIPiprazole 15 MG tablet Commonly known as: ABILIFY Take 15 mg by mouth daily.   Artificial Tears 0.1-0.3 % Soln Generic drug: Dextran 70-Hypromellose Apply 1 drop to eye every 12 (twelve) hours as needed (dryness).   ascorbic acid 500 MG tablet Commonly known as: VITAMIN C Take 1 tablet (500 mg total) by mouth daily for 5 days. Start taking on: October 30, 2019   atorvastatin 10 MG tablet Commonly known as: LIPITOR Take 10 mg by mouth every evening.   azithromycin 250 MG tablet Commonly known as: Zithromax Take one tab PO daily x 2 more days, then stop. Zero refills   cefdinir 300 MG capsule Commonly known as: OMNICEF Take 1 capsule (300 mg total) by mouth 2 (two) times daily for 2 days.   clopidogrel 75 MG tablet Commonly known as: PLAVIX Take 75 mg by mouth every evening.   dexamethasone 6 MG tablet Commonly known as: DECADRON Take 1 tablet (6 mg total) by mouth daily for 5 days. Start taking on: October 29, 5408   folic acid-vitamin b complex-vitamin c-selenium-zinc 3 MG Tabs tablet Take 1 tablet by mouth daily.   metoprolol succinate 25 MG 24 hr tablet Commonly known as: TOPROL-XL Take 1 tablet (25 mg total) by mouth daily. Start taking on: October 30, 2019   nitroGLYCERIN 0.4 MG SL tablet Commonly known as: NITROSTAT Place 0.4 mg under the tongue every 5 (five) minutes as needed for chest pain.   ondansetron 4 MG tablet Commonly known as: ZOFRAN Take 4 mg by mouth every 6 (six) hours as needed for nausea or vomiting.   polyethylene glycol 17 g packet Commonly known as: MIRALAX /  GLYCOLAX Take 17 g by mouth daily.   ProMod Liqd Take 60 mLs by mouth 2 (two) times daily.   sevelamer 800 MG tablet Commonly known as: RENAGEL Take 1,600 mg by mouth 3 (three) times daily with meals.   traMADol 50 MG tablet Commonly known as: ULTRAM Take 1 tablet (50 mg total) by mouth daily.   Vitamin D3 50 MCG (2000 UT) capsule Take 2,000 Units by mouth daily.   zinc sulfate 220 (50 Zn) MG capsule Take 1 capsule (220 mg total) by mouth daily for 5 days. Start taking on: October 30, 2019      Follow-up Information    Follow up with PCP in 1-2 weeks. Schedule an appointment as soon as possible for a visit.          No Known Allergies  Consultations:  Palliative Care  Nephrology  Procedures/Studies: Dg Chest Port 1 View  Result Date: 10/25/2019 CLINICAL DATA:  Shortness of breath. EXAM: PORTABLE CHEST 1 VIEW COMPARISON:  04/20/2015 FINDINGS: Enlarged cardiac silhouette with some improvement with an improved inspiration. Interval mild diffuse prominence of the interstitial markings. Normal vascularity. No pleural fluid. Progressive aortic calcification. Thoracic spine degenerative changes. IMPRESSION: 1. Interval mild interstitial pulmonary edema, pneumonitis or chronic interstitial lung disease. 2. Mildly improved cardiomegaly. 3. Progressive aortic atherosclerosis. Electronically Signed   By: Claudie Revering M.D.   On: 10/25/2019 07:56     Subjective: Eager to be discharged today  Discharge Exam: Vitals:   10/29/19 0800 10/29/19 1300  BP: 109/63 Marland Kitchen)  134/56  Pulse: 75 91  Resp: 17 15  Temp: 98.1 F (36.7 C)   SpO2: 98% 98%   Vitals:   10/29/19 0500 10/29/19 0700 10/29/19 0800 10/29/19 1300  BP:  (!) 114/57 109/63 (!) 134/56  Pulse:  73 75 91  Resp:  14 17 15   Temp:   98.1 F (36.7 C)   TempSrc:  Axillary Axillary   SpO2:  100% 98% 98%  Weight: 98.5 kg       General: Pt is alert, awake, not in acute distress Cardiovascular: RRR, S1/S2 +, no rubs, no  gallops Respiratory: CTA bilaterally, no wheezing, no rhonchi Abdominal: Soft, NT, ND, bowel sounds + Extremities: no edema, no cyanosis   The results of significant diagnostics from this hospitalization (including imaging, microbiology, ancillary and laboratory) are listed below for reference.     Microbiology: Recent Results (from the past 240 hour(s))  Culture, blood (routine x 2)     Status: None (Preliminary result)   Collection Time: 10/25/19  5:05 PM   Specimen: BLOOD RIGHT FOREARM  Result Value Ref Range Status   Specimen Description BLOOD RIGHT FOREARM  Final   Special Requests   Final    BOTTLES DRAWN AEROBIC AND ANAEROBIC Blood Culture adequate volume   Culture   Final    NO GROWTH 4 DAYS Performed at [facility], [address], [place], [place] [address]    Report Status PENDING  Incomplete  Culture, blood (routine x 2)     Status: None (Preliminary result)   Collection Time: 10/25/19  5:14 PM   Specimen: BLOOD RIGHT HAND  Result Value Ref Range Status   Specimen Description BLOOD RIGHT HAND  Final   Special Requests   Final    BOTTLES DRAWN AEROBIC AND ANAEROBIC Blood Culture adequate volume   Culture   Final    NO GROWTH 4 DAYS Performed at [facility], [place] [address], [place], [place] [address]    Report Status PENDING  Incomplete  MRSA PCR Screening     Status: None   Collection Time: 10/25/19 11:25 PM   Specimen: Nasopharyngeal  Result Value Ref Range Status   MRSA by PCR NEGATIVE NEGATIVE Final    Comment:        The GeneXpert MRSA Assay (FDA approved for NASAL specimens only), is one component of a comprehensive MRSA colonization surveillance program. It is not intended to diagnose MRSA infection nor to guide or monitor treatment for MRSA infections. Performed at [facility], [place] [address], [place], [place] [address]      Labs: BNP (last 3 results) Recent Labs    10/25/19 1202  BNP 9,678.9*   Basic Metabolic  Panel: Recent Labs  Lab 10/26/19 0548 10/27/19 0436 10/28/19 0350 10/28/19 0700 10/29/19 0345  NA 136 134* 131* 129* 131*  K 4.6 5.5* 4.9 5.5* 4.5  CL 95* 93* 90* 92* 92*  CO2 25 24 20* 19* 22  GLUCOSE 147* 148* 142* 120* 137*  BUN 43* 77* 100* 107* 75*  CREATININE 7.11* 9.57* 10.88* 10.89* 7.73*  CALCIUM 8.0* 7.6* 7.7* 7.6* 7.2*  MG 2.6* 2.7* 3.0*  --  2.6*  PHOS 4.9* 4.4 6.2* 6.5* 6.8*   Liver Function Tests: Recent Labs  Lab 10/25/19 1159 10/26/19 0548 10/27/19 0436 10/28/19 0350 10/28/19 0700 10/29/19 0345  AST 31 22 36 29  --  23  ALT 20 16 29 30   --  33  ALKPHOS 94 90 98  93  --  92  BILITOT 0.7 0.5 0.6 0.6  --  0.3  PROT 7.4 7.7 7.4 7.6  --  6.7  ALBUMIN 3.4* 3.5 3.4* 3.5 3.2* 3.1*   No results for input(s): LIPASE, AMYLASE in the last 168 hours. No results for input(s): AMMONIA in the last 168 hours. CBC: Recent Labs  Lab 10/25/19 0641 10/26/19 0548 10/27/19 0436 10/28/19 0350 10/28/19 0700 10/29/19 0345  WBC 8.7 4.4 5.7 8.8 8.0 6.9  NEUTROABS 6.7 3.6 4.7 6.9  --  5.4  HGB 9.9* 9.2* 8.8* 8.9* 8.4* 8.2*  HCT 30.6* 28.1* 27.1* 27.7* 25.5* 24.4*  MCV 95.6 93.7 93.4 93.3 91.7 91.4  PLT 241 210 225 247 237 218   Cardiac Enzymes: No results for input(s): CKTOTAL, CKMB, CKMBINDEX, TROPONINI in the last 168 hours. BNP: Invalid input(s): POCBNP CBG: Recent Labs  Lab 10/28/19 1621 10/28/19 1926 10/28/19 2308 10/29/19 0345 10/29/19 1253  GLUCAP 103* 117* 182* 147* 156*   D-Dimer Recent Labs    10/28/19 0350 10/29/19 0345  DDIMER 1.96* 1.33*   Hgb A1c No results for input(s): HGBA1C in the last 72 hours. Lipid Profile No results for input(s): CHOL, HDL, LDLCALC, TRIG, CHOLHDL, LDLDIRECT in the last 72 hours. Thyroid function studies No results for input(s): TSH, T4TOTAL, T3FREE, THYROIDAB in the last 72 hours.  Invalid input(s): FREET3 Anemia work up Recent Labs    10/28/19 0350 10/29/19 0345  FERRITIN 3,155* 2,093*   Urinalysis     Component Value Date/Time   COLORURINE AMBER (A) 03/27/2016 0810   APPEARANCEUR CLOUDY (A) 03/27/2016 0810   LABSPEC 1.015 03/27/2016 0810   PHURINE 5.5 03/27/2016 0810   GLUCOSEU NEGATIVE 03/27/2016 0810   HGBUR SMALL (A) 03/27/2016 0810   BILIRUBINUR SMALL (A) 03/27/2016 0810   KETONESUR 15 (A) 03/27/2016 0810   PROTEINUR >300 (A) 03/27/2016 0810   UROBILINOGEN 0.2 01/13/2015 0953   NITRITE NEGATIVE 03/27/2016 0810   LEUKOCYTESUR LARGE (A) 03/27/2016 0810   Sepsis Labs Invalid input(s): PROCALCITONIN,  WBC,  LACTICIDVEN Microbiology Recent Results (from the past 240 hour(s))  Culture, blood (routine x 2)     Status: None (Preliminary result)   Collection Time: 10/25/19  5:05 PM   Specimen: BLOOD RIGHT FOREARM  Result Value Ref Range Status   Specimen Description BLOOD RIGHT FOREARM  Final   Special Requests   Final    BOTTLES DRAWN AEROBIC AND ANAEROBIC Blood Culture adequate volume   Culture   Final    NO GROWTH 4 DAYS Performed at [facility], [place] [address] [place], [place] [address]    Report Status PENDING  Incomplete  Culture, blood (routine x 2)     Status: None (Preliminary result)   Collection Time: 10/25/19  5:14 PM   Specimen: BLOOD RIGHT HAND  Result Value Ref Range Status   Specimen Description BLOOD RIGHT HAND  Final   Special Requests   Final    BOTTLES DRAWN AEROBIC AND ANAEROBIC Blood Culture adequate volume   Culture   Final    NO GROWTH 4 DAYS Performed at [facility], [place] [address], [place], [place] [address]    Report Status PENDING  Incomplete  MRSA PCR Screening     Status: None   Collection Time: 10/25/19 11:25 PM   Specimen: Nasopharyngeal  Result Value Ref Range Status   MRSA by PCR NEGATIVE NEGATIVE Final    Comment:        The GeneXpert MRSA Assay (FDA  approved for NASAL specimens only), is one component of a comprehensive MRSA colonization surveillance program. It is not intended to diagnose MRSA infection nor to  guide or monitor treatment for MRSA infections. Performed at [facility], [place] [address] [place], [place] [address]    Time spent: 59min  SIGNED:   Marylu Lund, MD  [facility] 10/29/2019, 3:33 PM  If 7PM-7AM, please contact night-coverage

## 2019-10-30 DIAGNOSIS — N2581 Secondary hyperparathyroidism of renal origin: Secondary | ICD-10-CM | POA: Diagnosis not present

## 2019-10-30 DIAGNOSIS — D509 Iron deficiency anemia, unspecified: Secondary | ICD-10-CM | POA: Diagnosis not present

## 2019-10-30 DIAGNOSIS — Z992 Dependence on renal dialysis: Secondary | ICD-10-CM | POA: Diagnosis not present

## 2019-10-30 DIAGNOSIS — N186 End stage renal disease: Secondary | ICD-10-CM | POA: Diagnosis not present

## 2019-10-30 DIAGNOSIS — U071 COVID-19: Secondary | ICD-10-CM | POA: Diagnosis not present

## 2019-10-30 DIAGNOSIS — D631 Anemia in chronic kidney disease: Secondary | ICD-10-CM | POA: Diagnosis not present

## 2019-10-30 LAB — CULTURE, BLOOD (ROUTINE X 2)
Culture: NO GROWTH
Culture: NO GROWTH
Special Requests: ADEQUATE
Special Requests: ADEQUATE

## 2019-10-31 DIAGNOSIS — U071 COVID-19: Secondary | ICD-10-CM | POA: Diagnosis not present

## 2019-10-31 DIAGNOSIS — R778 Other specified abnormalities of plasma proteins: Secondary | ICD-10-CM | POA: Diagnosis not present

## 2019-10-31 DIAGNOSIS — J9601 Acute respiratory failure with hypoxia: Secondary | ICD-10-CM | POA: Diagnosis not present

## 2019-10-31 DIAGNOSIS — J189 Pneumonia, unspecified organism: Secondary | ICD-10-CM | POA: Diagnosis not present

## 2019-11-01 DIAGNOSIS — Z992 Dependence on renal dialysis: Secondary | ICD-10-CM | POA: Diagnosis not present

## 2019-11-01 DIAGNOSIS — N186 End stage renal disease: Secondary | ICD-10-CM | POA: Diagnosis not present

## 2019-11-01 DIAGNOSIS — D631 Anemia in chronic kidney disease: Secondary | ICD-10-CM | POA: Diagnosis not present

## 2019-11-01 DIAGNOSIS — D509 Iron deficiency anemia, unspecified: Secondary | ICD-10-CM | POA: Diagnosis not present

## 2019-11-01 DIAGNOSIS — N2581 Secondary hyperparathyroidism of renal origin: Secondary | ICD-10-CM | POA: Diagnosis not present

## 2019-11-04 DIAGNOSIS — N186 End stage renal disease: Secondary | ICD-10-CM | POA: Diagnosis not present

## 2019-11-04 DIAGNOSIS — D631 Anemia in chronic kidney disease: Secondary | ICD-10-CM | POA: Diagnosis not present

## 2019-11-04 DIAGNOSIS — U071 COVID-19: Secondary | ICD-10-CM | POA: Diagnosis not present

## 2019-11-04 DIAGNOSIS — Z992 Dependence on renal dialysis: Secondary | ICD-10-CM | POA: Diagnosis not present

## 2019-11-04 DIAGNOSIS — N2581 Secondary hyperparathyroidism of renal origin: Secondary | ICD-10-CM | POA: Diagnosis not present

## 2019-11-04 DIAGNOSIS — D509 Iron deficiency anemia, unspecified: Secondary | ICD-10-CM | POA: Diagnosis not present

## 2019-11-06 DIAGNOSIS — D631 Anemia in chronic kidney disease: Secondary | ICD-10-CM | POA: Diagnosis not present

## 2019-11-06 DIAGNOSIS — Z992 Dependence on renal dialysis: Secondary | ICD-10-CM | POA: Diagnosis not present

## 2019-11-06 DIAGNOSIS — N186 End stage renal disease: Secondary | ICD-10-CM | POA: Diagnosis not present

## 2019-11-06 DIAGNOSIS — U071 COVID-19: Secondary | ICD-10-CM | POA: Diagnosis not present

## 2019-11-06 DIAGNOSIS — N2581 Secondary hyperparathyroidism of renal origin: Secondary | ICD-10-CM | POA: Diagnosis not present

## 2019-11-06 DIAGNOSIS — D509 Iron deficiency anemia, unspecified: Secondary | ICD-10-CM | POA: Diagnosis not present

## 2019-11-11 DIAGNOSIS — N2581 Secondary hyperparathyroidism of renal origin: Secondary | ICD-10-CM | POA: Diagnosis not present

## 2019-11-11 DIAGNOSIS — D509 Iron deficiency anemia, unspecified: Secondary | ICD-10-CM | POA: Diagnosis not present

## 2019-11-11 DIAGNOSIS — N186 End stage renal disease: Secondary | ICD-10-CM | POA: Diagnosis not present

## 2019-11-11 DIAGNOSIS — D631 Anemia in chronic kidney disease: Secondary | ICD-10-CM | POA: Diagnosis not present

## 2019-11-11 DIAGNOSIS — Z992 Dependence on renal dialysis: Secondary | ICD-10-CM | POA: Diagnosis not present

## 2019-11-13 DIAGNOSIS — U071 COVID-19: Secondary | ICD-10-CM | POA: Diagnosis not present

## 2019-11-13 DIAGNOSIS — N2581 Secondary hyperparathyroidism of renal origin: Secondary | ICD-10-CM | POA: Diagnosis not present

## 2019-11-13 DIAGNOSIS — Z992 Dependence on renal dialysis: Secondary | ICD-10-CM | POA: Diagnosis not present

## 2019-11-13 DIAGNOSIS — D631 Anemia in chronic kidney disease: Secondary | ICD-10-CM | POA: Diagnosis not present

## 2019-11-13 DIAGNOSIS — N186 End stage renal disease: Secondary | ICD-10-CM | POA: Diagnosis not present

## 2019-11-13 DIAGNOSIS — D509 Iron deficiency anemia, unspecified: Secondary | ICD-10-CM | POA: Diagnosis not present

## 2019-11-14 DIAGNOSIS — L89153 Pressure ulcer of sacral region, stage 3: Secondary | ICD-10-CM | POA: Diagnosis not present

## 2019-11-14 DIAGNOSIS — M6281 Muscle weakness (generalized): Secondary | ICD-10-CM | POA: Diagnosis not present

## 2019-11-14 DIAGNOSIS — Z7409 Other reduced mobility: Secondary | ICD-10-CM | POA: Diagnosis not present

## 2019-11-15 DIAGNOSIS — N2581 Secondary hyperparathyroidism of renal origin: Secondary | ICD-10-CM | POA: Diagnosis not present

## 2019-11-15 DIAGNOSIS — D509 Iron deficiency anemia, unspecified: Secondary | ICD-10-CM | POA: Diagnosis not present

## 2019-11-15 DIAGNOSIS — Z992 Dependence on renal dialysis: Secondary | ICD-10-CM | POA: Diagnosis not present

## 2019-11-15 DIAGNOSIS — N186 End stage renal disease: Secondary | ICD-10-CM | POA: Diagnosis not present

## 2019-11-15 DIAGNOSIS — D631 Anemia in chronic kidney disease: Secondary | ICD-10-CM | POA: Diagnosis not present

## 2019-11-17 DIAGNOSIS — N186 End stage renal disease: Secondary | ICD-10-CM | POA: Diagnosis not present

## 2019-11-17 DIAGNOSIS — U071 COVID-19: Secondary | ICD-10-CM | POA: Diagnosis not present

## 2019-11-17 DIAGNOSIS — E1122 Type 2 diabetes mellitus with diabetic chronic kidney disease: Secondary | ICD-10-CM | POA: Diagnosis not present

## 2019-11-17 DIAGNOSIS — D509 Iron deficiency anemia, unspecified: Secondary | ICD-10-CM | POA: Diagnosis not present

## 2019-11-17 DIAGNOSIS — Z992 Dependence on renal dialysis: Secondary | ICD-10-CM | POA: Diagnosis not present

## 2019-11-17 DIAGNOSIS — D631 Anemia in chronic kidney disease: Secondary | ICD-10-CM | POA: Diagnosis not present

## 2019-11-17 DIAGNOSIS — I10 Essential (primary) hypertension: Secondary | ICD-10-CM | POA: Diagnosis not present

## 2019-11-17 DIAGNOSIS — E785 Hyperlipidemia, unspecified: Secondary | ICD-10-CM | POA: Diagnosis not present

## 2019-11-17 DIAGNOSIS — N2581 Secondary hyperparathyroidism of renal origin: Secondary | ICD-10-CM | POA: Diagnosis not present

## 2019-11-19 DIAGNOSIS — D631 Anemia in chronic kidney disease: Secondary | ICD-10-CM | POA: Diagnosis not present

## 2019-11-19 DIAGNOSIS — N186 End stage renal disease: Secondary | ICD-10-CM | POA: Diagnosis not present

## 2019-11-19 DIAGNOSIS — D509 Iron deficiency anemia, unspecified: Secondary | ICD-10-CM | POA: Diagnosis not present

## 2019-11-19 DIAGNOSIS — Z992 Dependence on renal dialysis: Secondary | ICD-10-CM | POA: Diagnosis not present

## 2019-11-19 DIAGNOSIS — N2581 Secondary hyperparathyroidism of renal origin: Secondary | ICD-10-CM | POA: Diagnosis not present

## 2019-11-19 DIAGNOSIS — U071 COVID-19: Secondary | ICD-10-CM | POA: Diagnosis not present

## 2019-11-22 DIAGNOSIS — N2581 Secondary hyperparathyroidism of renal origin: Secondary | ICD-10-CM | POA: Diagnosis not present

## 2019-11-22 DIAGNOSIS — N186 End stage renal disease: Secondary | ICD-10-CM | POA: Diagnosis not present

## 2019-11-22 DIAGNOSIS — Z992 Dependence on renal dialysis: Secondary | ICD-10-CM | POA: Diagnosis not present

## 2019-11-22 DIAGNOSIS — D509 Iron deficiency anemia, unspecified: Secondary | ICD-10-CM | POA: Diagnosis not present

## 2019-11-22 DIAGNOSIS — D631 Anemia in chronic kidney disease: Secondary | ICD-10-CM | POA: Diagnosis not present

## 2019-11-23 DIAGNOSIS — I10 Essential (primary) hypertension: Secondary | ICD-10-CM | POA: Diagnosis not present

## 2019-11-26 DIAGNOSIS — M6281 Muscle weakness (generalized): Secondary | ICD-10-CM | POA: Diagnosis not present

## 2019-11-26 DIAGNOSIS — N186 End stage renal disease: Secondary | ICD-10-CM | POA: Diagnosis not present

## 2019-11-26 DIAGNOSIS — Z7409 Other reduced mobility: Secondary | ICD-10-CM | POA: Diagnosis not present

## 2019-11-26 DIAGNOSIS — L89153 Pressure ulcer of sacral region, stage 3: Secondary | ICD-10-CM | POA: Diagnosis not present

## 2019-12-11 ENCOUNTER — Encounter (HOSPITAL_COMMUNITY): Payer: Self-pay | Admitting: Emergency Medicine

## 2019-12-11 ENCOUNTER — Inpatient Hospital Stay (HOSPITAL_COMMUNITY): Payer: Medicare Other

## 2019-12-11 ENCOUNTER — Other Ambulatory Visit (HOSPITAL_COMMUNITY): Payer: Medicare Other

## 2019-12-11 ENCOUNTER — Ambulatory Visit (HOSPITAL_COMMUNITY): Admit: 2019-12-11 | Payer: Medicare Other | Admitting: Internal Medicine

## 2019-12-11 ENCOUNTER — Inpatient Hospital Stay (HOSPITAL_COMMUNITY): Admission: EM | Disposition: E | Payer: Self-pay | Source: Skilled Nursing Facility | Attending: Internal Medicine

## 2019-12-11 ENCOUNTER — Inpatient Hospital Stay (HOSPITAL_COMMUNITY)
Admission: EM | Admit: 2019-12-11 | Discharge: 2019-12-26 | DRG: 250 | Disposition: E | Payer: Medicare Other | Source: Skilled Nursing Facility | Attending: Pulmonary Disease | Admitting: Pulmonary Disease

## 2019-12-11 ENCOUNTER — Emergency Department (HOSPITAL_COMMUNITY): Payer: Medicare Other

## 2019-12-11 DIAGNOSIS — E118 Type 2 diabetes mellitus with unspecified complications: Secondary | ICD-10-CM | POA: Diagnosis not present

## 2019-12-11 DIAGNOSIS — E875 Hyperkalemia: Secondary | ICD-10-CM | POA: Diagnosis not present

## 2019-12-11 DIAGNOSIS — G931 Anoxic brain damage, not elsewhere classified: Secondary | ICD-10-CM | POA: Diagnosis present

## 2019-12-11 DIAGNOSIS — Z66 Do not resuscitate: Secondary | ICD-10-CM | POA: Diagnosis not present

## 2019-12-11 DIAGNOSIS — I2109 ST elevation (STEMI) myocardial infarction involving other coronary artery of anterior wall: Secondary | ICD-10-CM | POA: Diagnosis present

## 2019-12-11 DIAGNOSIS — Z8619 Personal history of other infectious and parasitic diseases: Secondary | ICD-10-CM

## 2019-12-11 DIAGNOSIS — Z992 Dependence on renal dialysis: Secondary | ICD-10-CM

## 2019-12-11 DIAGNOSIS — N2581 Secondary hyperparathyroidism of renal origin: Secondary | ICD-10-CM | POA: Diagnosis present

## 2019-12-11 DIAGNOSIS — E872 Acidosis: Secondary | ICD-10-CM | POA: Diagnosis present

## 2019-12-11 DIAGNOSIS — I252 Old myocardial infarction: Secondary | ICD-10-CM

## 2019-12-11 DIAGNOSIS — N179 Acute kidney failure, unspecified: Secondary | ICD-10-CM | POA: Diagnosis not present

## 2019-12-11 DIAGNOSIS — I2102 ST elevation (STEMI) myocardial infarction involving left anterior descending coronary artery: Secondary | ICD-10-CM

## 2019-12-11 DIAGNOSIS — Z978 Presence of other specified devices: Secondary | ICD-10-CM | POA: Diagnosis not present

## 2019-12-11 DIAGNOSIS — I5042 Chronic combined systolic (congestive) and diastolic (congestive) heart failure: Secondary | ICD-10-CM | POA: Diagnosis present

## 2019-12-11 DIAGNOSIS — I272 Pulmonary hypertension, unspecified: Secondary | ICD-10-CM | POA: Diagnosis present

## 2019-12-11 DIAGNOSIS — I4901 Ventricular fibrillation: Secondary | ICD-10-CM | POA: Diagnosis present

## 2019-12-11 DIAGNOSIS — E1122 Type 2 diabetes mellitus with diabetic chronic kidney disease: Secondary | ICD-10-CM | POA: Diagnosis not present

## 2019-12-11 DIAGNOSIS — I255 Ischemic cardiomyopathy: Secondary | ICD-10-CM | POA: Diagnosis present

## 2019-12-11 DIAGNOSIS — H547 Unspecified visual loss: Secondary | ICD-10-CM | POA: Diagnosis present

## 2019-12-11 DIAGNOSIS — L89159 Pressure ulcer of sacral region, unspecified stage: Secondary | ICD-10-CM | POA: Diagnosis present

## 2019-12-11 DIAGNOSIS — R4020 Unspecified coma: Secondary | ICD-10-CM | POA: Diagnosis not present

## 2019-12-11 DIAGNOSIS — J9600 Acute respiratory failure, unspecified whether with hypoxia or hypercapnia: Secondary | ICD-10-CM | POA: Diagnosis not present

## 2019-12-11 DIAGNOSIS — Z7189 Other specified counseling: Secondary | ICD-10-CM | POA: Diagnosis not present

## 2019-12-11 DIAGNOSIS — I132 Hypertensive heart and chronic kidney disease with heart failure and with stage 5 chronic kidney disease, or end stage renal disease: Secondary | ICD-10-CM | POA: Diagnosis present

## 2019-12-11 DIAGNOSIS — Z8674 Personal history of sudden cardiac arrest: Secondary | ICD-10-CM

## 2019-12-11 DIAGNOSIS — Z7902 Long term (current) use of antithrombotics/antiplatelets: Secondary | ICD-10-CM

## 2019-12-11 DIAGNOSIS — I2510 Atherosclerotic heart disease of native coronary artery without angina pectoris: Secondary | ICD-10-CM | POA: Diagnosis not present

## 2019-12-11 DIAGNOSIS — D631 Anemia in chronic kidney disease: Secondary | ICD-10-CM | POA: Diagnosis present

## 2019-12-11 DIAGNOSIS — I5022 Chronic systolic (congestive) heart failure: Secondary | ICD-10-CM | POA: Diagnosis not present

## 2019-12-11 DIAGNOSIS — Z0189 Encounter for other specified special examinations: Secondary | ICD-10-CM

## 2019-12-11 DIAGNOSIS — R570 Cardiogenic shock: Secondary | ICD-10-CM | POA: Diagnosis present

## 2019-12-11 DIAGNOSIS — E785 Hyperlipidemia, unspecified: Secondary | ICD-10-CM | POA: Diagnosis present

## 2019-12-11 DIAGNOSIS — I462 Cardiac arrest due to underlying cardiac condition: Secondary | ICD-10-CM | POA: Diagnosis present

## 2019-12-11 DIAGNOSIS — I469 Cardiac arrest, cause unspecified: Secondary | ICD-10-CM

## 2019-12-11 DIAGNOSIS — Z515 Encounter for palliative care: Secondary | ICD-10-CM | POA: Diagnosis not present

## 2019-12-11 DIAGNOSIS — H913 Deaf nonspeaking, not elsewhere classified: Secondary | ICD-10-CM | POA: Diagnosis present

## 2019-12-11 DIAGNOSIS — R001 Bradycardia, unspecified: Secondary | ICD-10-CM | POA: Diagnosis not present

## 2019-12-11 DIAGNOSIS — Z89432 Acquired absence of left foot: Secondary | ICD-10-CM

## 2019-12-11 DIAGNOSIS — Z79899 Other long term (current) drug therapy: Secondary | ICD-10-CM

## 2019-12-11 DIAGNOSIS — I213 ST elevation (STEMI) myocardial infarction of unspecified site: Secondary | ICD-10-CM

## 2019-12-11 DIAGNOSIS — J9690 Respiratory failure, unspecified, unspecified whether with hypoxia or hypercapnia: Secondary | ICD-10-CM

## 2019-12-11 DIAGNOSIS — R0603 Acute respiratory distress: Secondary | ICD-10-CM | POA: Diagnosis not present

## 2019-12-11 DIAGNOSIS — R9431 Abnormal electrocardiogram [ECG] [EKG]: Secondary | ICD-10-CM | POA: Diagnosis not present

## 2019-12-11 DIAGNOSIS — Z01818 Encounter for other preprocedural examination: Secondary | ICD-10-CM

## 2019-12-11 DIAGNOSIS — E1169 Type 2 diabetes mellitus with other specified complication: Secondary | ICD-10-CM | POA: Diagnosis present

## 2019-12-11 DIAGNOSIS — Z89431 Acquired absence of right foot: Secondary | ICD-10-CM

## 2019-12-11 DIAGNOSIS — R061 Stridor: Secondary | ICD-10-CM | POA: Diagnosis not present

## 2019-12-11 DIAGNOSIS — Z6838 Body mass index (BMI) 38.0-38.9, adult: Secondary | ICD-10-CM

## 2019-12-11 DIAGNOSIS — Z4659 Encounter for fitting and adjustment of other gastrointestinal appliance and device: Secondary | ICD-10-CM

## 2019-12-11 DIAGNOSIS — R627 Adult failure to thrive: Secondary | ICD-10-CM | POA: Diagnosis present

## 2019-12-11 DIAGNOSIS — J9601 Acute respiratory failure with hypoxia: Secondary | ICD-10-CM | POA: Diagnosis present

## 2019-12-11 DIAGNOSIS — E1151 Type 2 diabetes mellitus with diabetic peripheral angiopathy without gangrene: Secondary | ICD-10-CM | POA: Diagnosis present

## 2019-12-11 DIAGNOSIS — Z8249 Family history of ischemic heart disease and other diseases of the circulatory system: Secondary | ICD-10-CM

## 2019-12-11 DIAGNOSIS — Z794 Long term (current) use of insulin: Secondary | ICD-10-CM

## 2019-12-11 DIAGNOSIS — I2584 Coronary atherosclerosis due to calcified coronary lesion: Secondary | ICD-10-CM | POA: Diagnosis present

## 2019-12-11 DIAGNOSIS — N186 End stage renal disease: Secondary | ICD-10-CM | POA: Diagnosis not present

## 2019-12-11 HISTORY — PX: CORONARY/GRAFT ACUTE MI REVASCULARIZATION: CATH118305

## 2019-12-11 HISTORY — PX: LEFT HEART CATH AND CORONARY ANGIOGRAPHY: CATH118249

## 2019-12-11 LAB — CBC
HCT: 32.6 % — ABNORMAL LOW (ref 36.0–46.0)
Hemoglobin: 10.3 g/dL — ABNORMAL LOW (ref 12.0–15.0)
MCH: 32.4 pg (ref 26.0–34.0)
MCHC: 31.6 g/dL (ref 30.0–36.0)
MCV: 102.5 fL — ABNORMAL HIGH (ref 80.0–100.0)
Platelets: 271 10*3/uL (ref 150–400)
RBC: 3.18 MIL/uL — ABNORMAL LOW (ref 3.87–5.11)
RDW: 18.4 % — ABNORMAL HIGH (ref 11.5–15.5)
WBC: 10.1 10*3/uL (ref 4.0–10.5)
nRBC: 0.6 % — ABNORMAL HIGH (ref 0.0–0.2)

## 2019-12-11 LAB — POCT I-STAT, CHEM 8
BUN: 23 mg/dL (ref 8–23)
BUN: 25 mg/dL — ABNORMAL HIGH (ref 8–23)
BUN: 23 mg/dL (ref 8–23)
Calcium, Ion: 1.02 mmol/L — ABNORMAL LOW (ref 1.15–1.40)
Calcium, Ion: 0.99 mmol/L — ABNORMAL LOW (ref 1.15–1.40)
Calcium, Ion: 0.93 mmol/L — ABNORMAL LOW (ref 1.15–1.40)
Chloride: 95 mmol/L — ABNORMAL LOW (ref 98–111)
Chloride: 94 mmol/L — ABNORMAL LOW (ref 98–111)
Chloride: 94 mmol/L — ABNORMAL LOW (ref 98–111)
Creatinine, Ser: 6 mg/dL — ABNORMAL HIGH (ref 0.44–1.00)
Creatinine, Ser: 6.5 mg/dL — ABNORMAL HIGH (ref 0.44–1.00)
Creatinine, Ser: 6.2 mg/dL — ABNORMAL HIGH (ref 0.44–1.00)
Glucose, Bld: 148 mg/dL — ABNORMAL HIGH (ref 70–99)
Glucose, Bld: 206 mg/dL — ABNORMAL HIGH (ref 70–99)
Glucose, Bld: 213 mg/dL — ABNORMAL HIGH (ref 70–99)
HCT: 30 % — ABNORMAL LOW (ref 36.0–46.0)
HCT: 34 % — ABNORMAL LOW (ref 36.0–46.0)
HCT: 33 % — ABNORMAL LOW (ref 36.0–46.0)
Hemoglobin: 10.2 g/dL — ABNORMAL LOW (ref 12.0–15.0)
Hemoglobin: 11.6 g/dL — ABNORMAL LOW (ref 12.0–15.0)
Hemoglobin: 11.2 g/dL — ABNORMAL LOW (ref 12.0–15.0)
Potassium: 3.6 mmol/L (ref 3.5–5.1)
Potassium: 4.1 mmol/L (ref 3.5–5.1)
Potassium: 3.6 mmol/L (ref 3.5–5.1)
Sodium: 136 mmol/L (ref 135–145)
Sodium: 134 mmol/L — ABNORMAL LOW (ref 135–145)
Sodium: 135 mmol/L (ref 135–145)
TCO2: 33 mmol/L — ABNORMAL HIGH (ref 22–32)
TCO2: 29 mmol/L (ref 22–32)
TCO2: 26 mmol/L (ref 22–32)

## 2019-12-11 LAB — BASIC METABOLIC PANEL
Anion gap: 18 — ABNORMAL HIGH (ref 5–15)
Anion gap: 15 (ref 5–15)
Anion gap: 17 — ABNORMAL HIGH (ref 5–15)
Anion gap: 16 — ABNORMAL HIGH (ref 5–15)
Anion gap: 14 (ref 5–15)
BUN: 20 mg/dL (ref 8–23)
BUN: 23 mg/dL (ref 8–23)
BUN: 25 mg/dL — ABNORMAL HIGH (ref 8–23)
BUN: 24 mg/dL — ABNORMAL HIGH (ref 8–23)
BUN: 24 mg/dL — ABNORMAL HIGH (ref 8–23)
CO2: 26 mmol/L (ref 22–32)
CO2: 26 mmol/L (ref 22–32)
CO2: 23 mmol/L (ref 22–32)
CO2: 24 mmol/L (ref 22–32)
CO2: 25 mmol/L (ref 22–32)
Calcium: 9.2 mg/dL (ref 8.9–10.3)
Calcium: 8.1 mg/dL — ABNORMAL LOW (ref 8.9–10.3)
Calcium: 7.8 mg/dL — ABNORMAL LOW (ref 8.9–10.3)
Calcium: 7.5 mg/dL — ABNORMAL LOW (ref 8.9–10.3)
Calcium: 7.6 mg/dL — ABNORMAL LOW (ref 8.9–10.3)
Chloride: 94 mmol/L — ABNORMAL LOW (ref 98–111)
Chloride: 93 mmol/L — ABNORMAL LOW (ref 98–111)
Chloride: 93 mmol/L — ABNORMAL LOW (ref 98–111)
Chloride: 93 mmol/L — ABNORMAL LOW (ref 98–111)
Chloride: 95 mmol/L — ABNORMAL LOW (ref 98–111)
Creatinine, Ser: 6.54 mg/dL — ABNORMAL HIGH (ref 0.44–1.00)
Creatinine, Ser: 6.55 mg/dL — ABNORMAL HIGH (ref 0.44–1.00)
Creatinine, Ser: 6.52 mg/dL — ABNORMAL HIGH (ref 0.44–1.00)
Creatinine, Ser: 6.65 mg/dL — ABNORMAL HIGH (ref 0.44–1.00)
Creatinine, Ser: 6.62 mg/dL — ABNORMAL HIGH (ref 0.44–1.00)
GFR calc Af Amer: 7 mL/min — ABNORMAL LOW (ref >60)
GFR calc Af Amer: 7 mL/min — ABNORMAL LOW (ref >60)
GFR calc Af Amer: 7 mL/min — ABNORMAL LOW (ref >60)
GFR calc Af Amer: 7 mL/min — ABNORMAL LOW (ref >60)
GFR calc Af Amer: 7 mL/min — ABNORMAL LOW (ref >60)
GFR calc non Af Amer: 6 mL/min — ABNORMAL LOW (ref >60)
GFR calc non Af Amer: 6 mL/min — ABNORMAL LOW (ref >60)
GFR calc non Af Amer: 6 mL/min — ABNORMAL LOW (ref >60)
GFR calc non Af Amer: 6 mL/min — ABNORMAL LOW (ref >60)
GFR calc non Af Amer: 6 mL/min — ABNORMAL LOW (ref >60)
Glucose, Bld: 181 mg/dL — ABNORMAL HIGH (ref 70–99)
Glucose, Bld: 180 mg/dL — ABNORMAL HIGH (ref 70–99)
Glucose, Bld: 201 mg/dL — ABNORMAL HIGH (ref 70–99)
Glucose, Bld: 179 mg/dL — ABNORMAL HIGH (ref 70–99)
Glucose, Bld: 154 mg/dL — ABNORMAL HIGH (ref 70–99)
Potassium: 3.7 mmol/L (ref 3.5–5.1)
Potassium: 4.1 mmol/L (ref 3.5–5.1)
Potassium: 3.9 mmol/L (ref 3.5–5.1)
Potassium: 4.2 mmol/L (ref 3.5–5.1)
Potassium: 3.9 mmol/L (ref 3.5–5.1)
Sodium: 138 mmol/L (ref 135–145)
Sodium: 134 mmol/L — ABNORMAL LOW (ref 135–145)
Sodium: 133 mmol/L — ABNORMAL LOW (ref 135–145)
Sodium: 133 mmol/L — ABNORMAL LOW (ref 135–145)
Sodium: 134 mmol/L — ABNORMAL LOW (ref 135–145)

## 2019-12-11 LAB — PROTIME-INR
INR: 1.5 — ABNORMAL HIGH (ref 0.8–1.2)
Prothrombin Time: 17.6 seconds — ABNORMAL HIGH (ref 11.4–15.2)

## 2019-12-11 LAB — POCT I-STAT 7, (LYTES, BLD GAS, ICA,H+H)
Acid-Base Excess: 7 mmol/L — ABNORMAL HIGH (ref 0.0–2.0)
Bicarbonate: 32.3 mmol/L — ABNORMAL HIGH (ref 20.0–28.0)
Calcium, Ion: 1 mmol/L — ABNORMAL LOW (ref 1.15–1.40)
HCT: 29 % — ABNORMAL LOW (ref 36.0–46.0)
Hemoglobin: 9.9 g/dL — ABNORMAL LOW (ref 12.0–15.0)
O2 Saturation: 100 %
Potassium: 3.6 mmol/L (ref 3.5–5.1)
Sodium: 135 mmol/L (ref 135–145)
TCO2: 34 mmol/L — ABNORMAL HIGH (ref 22–32)
pCO2 arterial: 49.5 mmHg — ABNORMAL HIGH (ref 32.0–48.0)
pH, Arterial: 7.423 (ref 7.350–7.450)
pO2, Arterial: 489 mmHg — ABNORMAL HIGH (ref 83.0–108.0)

## 2019-12-11 LAB — CBG MONITORING, ED: Glucose-Capillary: 110 mg/dL — ABNORMAL HIGH (ref 70–99)

## 2019-12-11 LAB — POCT ACTIVATED CLOTTING TIME
Activated Clotting Time: 208 seconds
Activated Clotting Time: 235 seconds
Activated Clotting Time: 241 seconds
Activated Clotting Time: 235 s
Activated Clotting Time: 213 seconds

## 2019-12-11 LAB — APTT: aPTT: >200 seconds (ref 24–36)

## 2019-12-11 LAB — GLUCOSE, CAPILLARY
Glucose-Capillary: 160 mg/dL — ABNORMAL HIGH (ref 70–99)
Glucose-Capillary: 151 mg/dL — ABNORMAL HIGH (ref 70–99)
Glucose-Capillary: 126 mg/dL — ABNORMAL HIGH (ref 70–99)

## 2019-12-11 LAB — TROPONIN I (HIGH SENSITIVITY)
Troponin I (High Sensitivity): 24 ng/L — ABNORMAL HIGH (ref <18)
Troponin I (High Sensitivity): 46 ng/L — ABNORMAL HIGH (ref <18)
Troponin I (High Sensitivity): 56 ng/L — ABNORMAL HIGH

## 2019-12-11 LAB — MRSA PCR SCREENING: MRSA by PCR: NEGATIVE

## 2019-12-11 SURGERY — CORONARY/GRAFT ACUTE MI REVASCULARIZATION
Anesthesia: LOCAL

## 2019-12-11 MED ORDER — ORAL CARE MOUTH RINSE
15.0000 mL | OROMUCOSAL | Status: DC
  Administered 2019-12-11 – 2019-12-23 (×119): 15 mL via OROMUCOSAL

## 2019-12-11 MED ORDER — HEPARIN SODIUM (PORCINE) 5000 UNIT/ML IJ SOLN
5000.0000 [IU] | Freq: Three times a day (TID) | INTRAMUSCULAR | Status: DC
  Administered 2019-12-12 – 2019-12-14 (×6): 5000 [IU] via SUBCUTANEOUS
  Filled 2019-12-11 (×5): qty 1

## 2019-12-11 MED ORDER — HEPARIN (PORCINE) IN NACL 1000-0.9 UT/500ML-% IV SOLN
INTRAVENOUS | Status: DC | PRN
  Administered 2019-12-11 (×2): 500 mL

## 2019-12-11 MED ORDER — SODIUM CHLORIDE 0.9% FLUSH
3.0000 mL | INTRAVENOUS | Status: DC | PRN
  Administered 2019-12-18: 3 mL via INTRAVENOUS

## 2019-12-11 MED ORDER — FENTANYL BOLUS VIA INFUSION
25.0000 ug | INTRAVENOUS | Status: DC | PRN
  Administered 2019-12-13 – 2019-12-21 (×19): 25 ug via INTRAVENOUS
  Filled 2019-12-11: qty 25

## 2019-12-11 MED ORDER — ARTIFICIAL TEARS OPHTHALMIC OINT
1.0000 "application " | TOPICAL_OINTMENT | Freq: Three times a day (TID) | OPHTHALMIC | Status: DC
  Administered 2019-12-11 – 2019-12-13 (×6): 1 via OPHTHALMIC
  Filled 2019-12-11: qty 3.5

## 2019-12-11 MED ORDER — SODIUM CHLORIDE 0.9 % IV SOLN: INTRAVENOUS | Status: DC

## 2019-12-11 MED ORDER — CHLORHEXIDINE GLUCONATE 0.12% ORAL RINSE (MEDLINE KIT)
15.0000 mL | Freq: Two times a day (BID) | OROMUCOSAL | Status: DC
  Administered 2019-12-11 – 2019-12-23 (×24): 15 mL via OROMUCOSAL

## 2019-12-11 MED ORDER — HEPARIN SODIUM (PORCINE) 1000 UNIT/ML IJ SOLN
INTRAMUSCULAR | Status: AC
  Filled 2019-12-11: qty 1

## 2019-12-11 MED ORDER — MIDAZOLAM HCL 2 MG/2ML IJ SOLN
INTRAMUSCULAR | Status: AC
  Filled 2019-12-11: qty 2

## 2019-12-11 MED ORDER — FENTANYL 2500MCG IN NS 250ML (10MCG/ML) PREMIX INFUSION
0.0000 ug/h | INTRAVENOUS | Status: DC
  Administered 2019-12-11: 50 ug/h via INTRAVENOUS
  Filled 2019-12-11: qty 250

## 2019-12-11 MED ORDER — CISATRACURIUM BOLUS VIA INFUSION
0.1000 mg/kg | Freq: Once | INTRAVENOUS | Status: DC
  Filled 2019-12-11: qty 11

## 2019-12-11 MED ORDER — SODIUM CHLORIDE 0.9% FLUSH
3.0000 mL | Freq: Once | INTRAVENOUS | Status: AC
  Administered 2019-12-11: 3 mL via INTRAVENOUS

## 2019-12-11 MED ORDER — ACETAMINOPHEN 325 MG PO TABS: 650.0000 mg | ORAL_TABLET | ORAL | Status: DC | PRN

## 2019-12-11 MED ORDER — CISATRACURIUM BOLUS VIA INFUSION
0.0500 mg/kg | INTRAVENOUS | Status: AC | PRN
  Administered 2019-12-11: 5.1 mg via INTRAVENOUS
  Filled 2019-12-11: qty 6

## 2019-12-11 MED ORDER — LIDOCAINE HCL (PF) 1 % IJ SOLN
INTRAMUSCULAR | Status: AC
  Filled 2019-12-11: qty 30

## 2019-12-11 MED ORDER — IOHEXOL 350 MG/ML SOLN
INTRAVENOUS | Status: DC | PRN
  Administered 2019-12-11: 130 mL

## 2019-12-11 MED ORDER — FENTANYL CITRATE (PF) 100 MCG/2ML IJ SOLN
50.0000 ug | Freq: Once | INTRAMUSCULAR | Status: AC
  Administered 2019-12-11: 50 ug via INTRAVENOUS

## 2019-12-11 MED ORDER — ETOMIDATE 2 MG/ML IV SOLN
INTRAVENOUS | Status: AC | PRN
  Administered 2019-12-11: 20 mg via INTRAVENOUS

## 2019-12-11 MED ORDER — FENTANYL CITRATE (PF) 100 MCG/2ML IJ SOLN
INTRAMUSCULAR | Status: AC
  Filled 2019-12-11: qty 2

## 2019-12-11 MED ORDER — SODIUM CHLORIDE 0.9 % IV SOLN
INTRAVENOUS | Status: AC | PRN
  Administered 2019-12-11: 999 mL/h via INTRAVENOUS

## 2019-12-11 MED ORDER — PROPOFOL 1000 MG/100ML IV EMUL
25.0000 ug/kg/min | INTRAVENOUS | Status: DC
  Administered 2019-12-11 (×2): 30 ug/kg/min via INTRAVENOUS
  Filled 2019-12-11 (×2): qty 100

## 2019-12-11 MED ORDER — ATORVASTATIN CALCIUM 10 MG PO TABS
10.0000 mg | ORAL_TABLET | Freq: Every evening | ORAL | Status: DC
  Administered 2019-12-11 – 2019-12-15 (×5): 10 mg via ORAL
  Filled 2019-12-11 (×5): qty 1

## 2019-12-11 MED ORDER — ASPIRIN 81 MG PO CHEW
324.0000 mg | CHEWABLE_TABLET | Freq: Once | ORAL | Status: AC
  Administered 2019-12-11: 324 mg via NASOGASTRIC
  Filled 2019-12-11: qty 4

## 2019-12-11 MED ORDER — AMIODARONE HCL IN DEXTROSE 360-4.14 MG/200ML-% IV SOLN
30.0000 mg/h | INTRAVENOUS | Status: DC
  Filled 2019-12-11: qty 200

## 2019-12-11 MED ORDER — NOREPINEPHRINE 4 MG/250ML-% IV SOLN
INTRAVENOUS | Status: AC
  Filled 2019-12-11: qty 250

## 2019-12-11 MED ORDER — MIDAZOLAM HCL 2 MG/2ML IJ SOLN
INTRAMUSCULAR | Status: AC | PRN
  Administered 2019-12-11: 2 mg via INTRAVENOUS

## 2019-12-11 MED ORDER — ROCURONIUM BROMIDE 50 MG/5ML IV SOLN
INTRAVENOUS | Status: AC | PRN
  Administered 2019-12-11: 80 mg via INTRAVENOUS

## 2019-12-11 MED ORDER — HEPARIN SODIUM (PORCINE) 1000 UNIT/ML IJ SOLN
INTRAMUSCULAR | Status: DC | PRN
  Administered 2019-12-11: 3000 [IU] via INTRAVENOUS
  Administered 2019-12-11: 4000 [IU] via INTRAVENOUS
  Administered 2019-12-11: 2000 [IU] via INTRAVENOUS
  Administered 2019-12-11: 7000 [IU] via INTRAVENOUS

## 2019-12-11 MED ORDER — CHLORHEXIDINE GLUCONATE CLOTH 2 % EX PADS: 6.0000 | MEDICATED_PAD | Freq: Every day | CUTANEOUS | Status: DC

## 2019-12-11 MED ORDER — INSULIN ASPART 100 UNIT/ML ~~LOC~~ SOLN
3.0000 [IU] | SUBCUTANEOUS | Status: DC
  Administered 2019-12-11: 6 [IU] via SUBCUTANEOUS
  Administered 2019-12-11: 9 [IU] via SUBCUTANEOUS
  Administered 2019-12-12 (×5): 3 [IU] via SUBCUTANEOUS
  Administered 2019-12-13: 6 [IU] via SUBCUTANEOUS
  Administered 2019-12-13 (×2): 3 [IU] via SUBCUTANEOUS
  Administered 2019-12-13 (×2): 6 [IU] via SUBCUTANEOUS
  Administered 2019-12-14: 3 [IU] via SUBCUTANEOUS
  Administered 2019-12-14 (×2): 6 [IU] via SUBCUTANEOUS
  Administered 2019-12-15 – 2019-12-16 (×5): 3 [IU] via SUBCUTANEOUS
  Administered 2019-12-17 (×2): 6 [IU] via SUBCUTANEOUS
  Administered 2019-12-17 (×2): 3 [IU] via SUBCUTANEOUS
  Administered 2019-12-17: 6 [IU] via SUBCUTANEOUS
  Administered 2019-12-17 – 2019-12-18 (×3): 3 [IU] via SUBCUTANEOUS
  Administered 2019-12-18 (×2): 6 [IU] via SUBCUTANEOUS
  Administered 2019-12-18 – 2019-12-19 (×2): 3 [IU] via SUBCUTANEOUS
  Administered 2019-12-19 – 2019-12-20 (×6): 6 [IU] via SUBCUTANEOUS
  Administered 2019-12-20: 3 [IU] via SUBCUTANEOUS
  Administered 2019-12-20: 6 [IU] via SUBCUTANEOUS
  Administered 2019-12-20: 3 [IU] via SUBCUTANEOUS
  Administered 2019-12-20: 6 [IU] via SUBCUTANEOUS
  Administered 2019-12-21: 3 [IU] via SUBCUTANEOUS
  Administered 2019-12-21: 9 [IU] via SUBCUTANEOUS
  Administered 2019-12-21 (×4): 3 [IU] via SUBCUTANEOUS
  Administered 2019-12-21: 6 [IU] via SUBCUTANEOUS

## 2019-12-11 MED ORDER — NOREPINEPHRINE 4 MG/250ML-% IV SOLN: 0.0000 ug/min | INTRAVENOUS | Status: DC

## 2019-12-11 MED ORDER — SODIUM CHLORIDE 0.9 % IV SOLN
250.0000 mL | INTRAVENOUS | Status: DC | PRN
  Administered 2019-12-12: 12:00:00 250 mL via INTRAVENOUS

## 2019-12-11 MED ORDER — HEPARIN (PORCINE) IN NACL 1000-0.9 UT/500ML-% IV SOLN
INTRAVENOUS | Status: AC
  Filled 2019-12-11: qty 500

## 2019-12-11 MED ORDER — PANTOPRAZOLE SODIUM 40 MG IV SOLR
40.0000 mg | Freq: Every day | INTRAVENOUS | Status: DC
  Administered 2019-12-11 – 2019-12-12 (×2): 40 mg via INTRAVENOUS
  Filled 2019-12-11 (×2): qty 40

## 2019-12-11 MED ORDER — FENTANYL 2500MCG IN NS 250ML (10MCG/ML) PREMIX INFUSION
0.0000 ug/h | INTRAVENOUS | Status: DC
  Administered 2019-12-11: 50 ug/h via INTRAVENOUS
  Administered 2019-12-12 (×2): 175 ug/h via INTRAVENOUS
  Administered 2019-12-13: 50 ug/h via INTRAVENOUS
  Administered 2019-12-14 – 2019-12-15 (×2): 150 ug/h via INTRAVENOUS
  Administered 2019-12-15: 200 ug/h via INTRAVENOUS
  Administered 2019-12-16: 75 ug/h via INTRAVENOUS
  Administered 2019-12-19: 30 ug/h via INTRAVENOUS
  Administered 2019-12-20: 75 ug/h via INTRAVENOUS
  Filled 2019-12-11 (×10): qty 250

## 2019-12-11 MED ORDER — MIDAZOLAM 50MG/50ML (1MG/ML) PREMIX INFUSION
0.5000 mg/h | INTRAVENOUS | Status: DC
  Administered 2019-12-11 – 2019-12-12 (×2): 2 mg/h via INTRAVENOUS
  Administered 2019-12-13: 15:00:00 1 mg/h via INTRAVENOUS
  Administered 2019-12-14 – 2019-12-15 (×2): 3 mg/h via INTRAVENOUS
  Administered 2019-12-16: 2 mg/h via INTRAVENOUS
  Filled 2019-12-11 (×6): qty 50

## 2019-12-11 MED ORDER — FENTANYL CITRATE (PF) 100 MCG/2ML IJ SOLN
INTRAMUSCULAR | Status: AC | PRN
  Administered 2019-12-11: 50 ug via INTRAVENOUS

## 2019-12-11 MED ORDER — NITROGLYCERIN 1 MG/10 ML FOR IR/CATH LAB
INTRA_ARTERIAL | Status: AC
  Filled 2019-12-11: qty 10

## 2019-12-11 MED ORDER — SODIUM BICARBONATE 8.4 % IV SOLN
INTRAVENOUS | Status: AC | PRN
  Administered 2019-12-11: 50 meq via INTRAVENOUS

## 2019-12-11 MED ORDER — SODIUM CHLORIDE 0.9 % IV SOLN
1.0000 ug/kg/min | INTRAVENOUS | Status: DC
  Administered 2019-12-11: 1.5 ug/kg/min via INTRAVENOUS
  Filled 2019-12-11 (×2): qty 20

## 2019-12-11 MED ORDER — CALCIUM CHLORIDE 10 % IV SOLN
INTRAVENOUS | Status: AC | PRN
  Administered 2019-12-11: 1 g via INTRAVENOUS

## 2019-12-11 MED ORDER — AMIODARONE HCL IN DEXTROSE 360-4.14 MG/200ML-% IV SOLN
60.0000 mg/h | INTRAVENOUS | Status: DC
  Administered 2019-12-11 (×2): 60 mg/h via INTRAVENOUS
  Filled 2019-12-11: qty 400

## 2019-12-11 MED ORDER — SODIUM CHLORIDE 0.9% FLUSH
3.0000 mL | Freq: Two times a day (BID) | INTRAVENOUS | Status: DC
  Administered 2019-12-11 – 2019-12-23 (×20): 3 mL via INTRAVENOUS

## 2019-12-11 MED ORDER — NOREPINEPHRINE BITARTRATE 1 MG/ML IV SOLN
INTRAVENOUS | Status: AC | PRN
  Administered 2019-12-11: 5 ug/min via INTRAVENOUS

## 2019-12-11 MED ORDER — ASPIRIN 325 MG PO TABS: 325.0000 mg | ORAL_TABLET | Freq: Every day | ORAL | Status: DC

## 2019-12-11 MED ORDER — AMIODARONE LOAD VIA INFUSION
150.0000 mg | Freq: Once | INTRAVENOUS | Status: AC
  Administered 2019-12-11: 17:00:00 150 mg via INTRAVENOUS
  Filled 2019-12-11: qty 83.34

## 2019-12-11 MED ORDER — CLOPIDOGREL BISULFATE 300 MG PO TABS
300.0000 mg | ORAL_TABLET | Freq: Once | ORAL | Status: AC
  Administered 2019-12-11: 300 mg via NASOGASTRIC
  Filled 2019-12-11: qty 1

## 2019-12-11 MED ORDER — ONDANSETRON HCL 4 MG/2ML IJ SOLN: 4.0000 mg | Freq: Four times a day (QID) | INTRAMUSCULAR | Status: DC | PRN

## 2019-12-11 MED ORDER — CHLORHEXIDINE GLUCONATE CLOTH 2 % EX PADS
6.0000 | MEDICATED_PAD | Freq: Every day | CUTANEOUS | Status: DC
  Administered 2019-12-12 – 2019-12-23 (×10): 6 via TOPICAL

## 2019-12-11 MED ORDER — CLOPIDOGREL BISULFATE 75 MG PO TABS: 75.0000 mg | ORAL_TABLET | Freq: Every day | ORAL | Status: DC

## 2019-12-11 MED ORDER — HYDRALAZINE HCL 20 MG/ML IJ SOLN
10.0000 mg | INTRAMUSCULAR | Status: AC | PRN
  Administered 2019-12-11: 10 mg via INTRAVENOUS
  Filled 2019-12-11: qty 1

## 2019-12-11 MED ORDER — CLOPIDOGREL BISULFATE 75 MG PO TABS
75.0000 mg | ORAL_TABLET | Freq: Every evening | ORAL | Status: DC
  Administered 2019-12-12 – 2019-12-15 (×4): 75 mg via ORAL
  Filled 2019-12-11 (×4): qty 1

## 2019-12-11 MED ORDER — MIDAZOLAM HCL 2 MG/2ML IJ SOLN
INTRAMUSCULAR | Status: DC | PRN
  Administered 2019-12-11: 1 mg via INTRAVENOUS

## 2019-12-11 MED ORDER — ASPIRIN 81 MG PO CHEW
81.0000 mg | CHEWABLE_TABLET | Freq: Every day | ORAL | Status: DC
  Administered 2019-12-12 – 2019-12-23 (×12): 81 mg via NASOGASTRIC
  Filled 2019-12-11 (×12): qty 1

## 2019-12-11 SURGICAL SUPPLY — 18 items
BALLN EMERGE MR PUSH 1.5X12 (BALLOONS) ×2
BALLN SAPPHIRE 2.0X15 (BALLOONS) ×2
BALLOON EMERGE MR PUSH 1.5X12 (BALLOONS) IMPLANT
BALLOON SAPPHIRE 2.0X15 (BALLOONS) IMPLANT
CATH DXT MULTI JL4 JR4 ANG PIG (CATHETERS) ×1 IMPLANT
CATH LAUNCHER 6FR EBU 3.75 (CATHETERS) ×1 IMPLANT
DEVICE CONTINUOUS FLUSH (MISCELLANEOUS) ×1 IMPLANT
HOVERMATT SINGLE USE (MISCELLANEOUS) ×1 IMPLANT
KIT ENCORE 26 ADVANTAGE (KITS) ×1 IMPLANT
KIT HEART LEFT (KITS) ×2 IMPLANT
KIT MICROPUNCTURE NIT STIFF (SHEATH) ×1 IMPLANT
PACK CARDIAC CATHETERIZATION (CUSTOM PROCEDURE TRAY) ×2 IMPLANT
SHEATH PINNACLE 6F 10CM (SHEATH) ×1 IMPLANT
TRANSDUCER W/STOPCOCK (MISCELLANEOUS) ×2 IMPLANT
TUBING CIL FLEX 10 FLL-RA (TUBING) ×2 IMPLANT
WIRE EMERALD 3MM-J .035X150CM (WIRE) ×1 IMPLANT
WIRE MARVEL STR TIP 190CM (WIRE) ×1 IMPLANT
WIRE RUNTHROUGH .014X180CM (WIRE) ×1 IMPLANT

## 2019-12-11 NOTE — Progress Notes (Signed)
EEG complete - results pending 

## 2019-12-11 NOTE — Progress Notes (Signed)
Patient transported from cath lab to room 2H20 on vent without any problems.  Ashley Mariner RRT

## 2019-12-11 NOTE — Progress Notes (Signed)
Responded to Code Stemi in ED.  Present when patient arrived.  Patient transported to Cath Lab.  There being no family present, Chaplain departed.  De Burrs Chaplain Resident

## 2019-12-11 NOTE — ED Provider Notes (Addendum)
[facility] CATH LAB Provider Note   CSN: [id number] Arrival date & time: 11/26/2019  1229     History Chief Complaint  Patient presents with  . Cardiac Arrest    [name] is a 63 y.o. female.  Level 5 caveat due to altered mental status with [facility] airway in place.  Patient with cardiac arrest in the field that was witnessed.  2 rounds of CPR and epinephrine and patient achieved return of spontaneous circulation.  Recently had Covid.  History of end-stage renal disease per EMS.  Patient has had heart attacks in the past.  Patient is on dialysis as well.  Patient was shocked by AED with fire department.  Had PEA on second rhythm.  The history is provided by the EMS personnel.  Cardiac Arrest Witnessed by:  Bystander Incident location:  Nursing home Time before BLS initiated:  Immediate Pulse:  Absent Initial cardiac rhythm per EMS:  PEA Treatments prior to arrival:  ACLS protocol Medications given prior to ED:  Epinephrine Airway: king airway.      Past Medical History:  Diagnosis Date  . Anemia    Due to fibroids.  . Anemia in CKD (chronic kidney disease) 12/14/2014  . Benign hypertension with ESRD (end-stage renal disease) ([place]) 02/12/2018  . Bilateral leg edema    a. Chronic.  Marland Kitchen Blind   . Cardiac arrest ([place]) 12/12/2014  . CHF (congestive heart failure) ([place])   . Coronary artery disease   . Deaf    USE SIGN INTERPRETER.  Divorced from husband but lives with him. Has daughter but she does not care for her.  . Diabetes mellitus    a. Per PCP note 2012 (A1C 9.2) - pt unwilling to take meds and was educated on risk of uncontrolled DM.  b. A1C 5.9 in 11/2013.  Marland Kitchen ESRD on hemodialysis ([place])    Mon, Wed, Fri  . Essential hypertension 08/09/2009   Qualifier: Diagnosis of  By: Sarita Haver  MD, Coralyn Mark    . Fibroid uterus   . Heart murmur   . Hyperlipidemia   . Hypertension    a. Has previously refused blood pressure meds.   . Leiomyoma of uterus  08/09/2009   Qualifier: Diagnosis of  By: Sarita Haver  MD, Coralyn Mark    . MI (myocardial infarction) ([place]) 11/2014  . Moderate tricuspid regurgitation   . Osteomyelitis ([place])   . Psychiatric disorder    She frequently exhibits paranoia and has been diagnosed with psychotic d/o NOS during hospital stay in the past. She is tangetial and perseverative during her visits. She apparently has had a bad experience with mental health in [place] in the past and refuses to discuss mental issues for fear that she will be sent back there. Her paranoia, communication issues, financial woes and lack of fam  . Pulmonary hypertension ([place])     Patient Active Problem List   Diagnosis Date Noted  . End-stage renal disease on hemodialysis ([place])   . Palliative care by specialist   . COVID-19 virus infection 10/25/2019  . Adjustment disorder with disturbance of conduct   . Benign hypertension with ESRD (end-stage renal disease) ([place]) 02/12/2018  . Uncontrolled type 2 diabetes mellitus with end-stage renal disease ([place]) 11/26/2017  . 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 Chronic combined systolic and diastolic congestive heart failure ([place]) 12/15/2015  . Dyslipidemia associated with type 2 diabetes mellitus ([place]) 12/15/2015  . Ventricular tachycardia, sustained ([place]) 12/15/2015  . Psychosis, paranoid ([place]) 08/20/2015  .  Non compliance w medication regimen 08/20/2015  . Hyperkalemia 04/19/2015  . ESRD (end stage renal disease) ([place]) 04/19/2015  . PAD (peripheral artery disease) ([place]) 03/23/2015  . Acute encephalopathy   . FTT (failure to thrive) in adult 01/14/2015  . DNR (do not resuscitate) discussion 12/16/2014  . Blindness with deafness 12/16/2014  . Anemia in CKD (chronic kidney disease) 12/14/2014  . Cardiac arrest ([place]) 12/12/2014  . Cardiomyopathy, ischemic-EF 30-35% 11/21/2014  . Chronic constipation 12/24/2013  . Diabetes mellitus with ESRD (end-stage renal disease) ([place]) 30-Apr-202014  . Acute respiratory failure with  hypoxia ([place]) 12/22/2013  . Hypertensive heart disease with CHF (congestive heart failure) ([place]) 08/09/2009    Past Surgical History:  Procedure Laterality Date  . AMPUTATION Bilateral 04/02/2015   Procedure: Amputation Bilateral Great Toes MTP Joint;  Surgeon: Newt Minion, MD;  Location: [facility];  Service: Orthopedics;  Laterality: Bilateral;  . AMPUTATION Left 05/14/2015   Procedure: Left Transmetatarsal Amputation;  Surgeon: Newt Minion, MD;  Location: [place];  Service: Orthopedics;  Laterality: Left;  . AMPUTATION Right 08/20/2015   Procedure: Transmetatarsal Amputation Right Foot;  Surgeon: Newt Minion, MD;  Location: [facility];  Service: Orthopedics;  Laterality: Right;  . AV FISTULA PLACEMENT Left 01/02/2014   Procedure: ARTERIOVENOUS (AV) FISTULA CREATION;  Surgeon: Rosetta Posner, MD;  Location: [facility];  Service: Vascular;  Laterality: Left;  . INSERTION OF DIALYSIS CATHETER Right 01/02/2014   Procedure: INSERTION OF DIALYSIS CATHETER;  Surgeon: Rosetta Posner, MD;  Location: [facility];  Service: Vascular;  Laterality: Right;  . NO PAST SURGERIES    . ORIF PATELLA Left 05/17/2015   Procedure: OPEN REDUCTION INTERNAL (ORIF) FIXATION PATELLA;  Surgeon: Newt Minion, MD;  Location: [place];  Service: Orthopedics;  Laterality: Left;     OB History    Gravida  1   Para  1   Term      Preterm      AB      Living  1     SAB      TAB      Ectopic      Multiple      Live Births              Family History  Problem Relation Age of Onset  . Other Father        Drowned from fishing?  . Heart disease Other     Social History   Tobacco Use  . Smoking status: Never Smoker  . Smokeless tobacco: Never Used  . Tobacco comment: Prior dip  Substance Use Topics  . Alcohol use: No  . Drug use: No    Home Medications Prior to Admission medications   Medication Sig Start Date End Date Taking? Authorizing Provider  albuterol (VENTOLIN HFA) 108 (90 Base) MCG/ACT inhaler Inhale 2  puffs into the lungs every 6 (six) hours. 10/29/19   Donne Hazel, MD  amiodarone (PACERONE) 200 MG tablet Take 200 mg by mouth every evening.     [provider]  amLODipine (NORVASC) 5 MG tablet Take 5 mg by mouth daily.    [provider]  ARIPiprazole (ABILIFY) 15 MG tablet Take 15 mg by mouth daily.  05/17/18   [provider]  atorvastatin (LIPITOR) 10 MG tablet Take 10 mg by mouth every evening.    [provider]  azithromycin (ZITHROMAX) 250 MG tablet Take one tab PO daily x 2 more days, then stop.  Zero refills 10/29/19   Donne Hazel, MD  Cholecalciferol (VITAMIN D3) 50 MCG (2000 UT) capsule Take 2,000 Units by mouth daily.    [provider]  clopidogrel (PLAVIX) 75 MG tablet Take 75 mg by mouth every evening.    [provider]  Dextran 70-Hypromellose (ARTIFICIAL TEARS) 0.1-0.3 % SOLN Apply 1 drop to eye every 12 (twelve) hours as needed (dryness).     [provider]  folic acid-vitamin b complex-vitamin c-selenium-zinc (DIALYVITE) 3 MG TABS tablet Take 1 tablet by mouth daily.    [provider]  insulin lispro (ADMELOG) 100 UNIT/ML injection Inject 0-5 Units into the skin See admin instructions. Per Sliding Scale twice dialy: 0-150 0units 151-400 5units    [provider]  metoprolol succinate (TOPROL-XL) 25 MG 24 hr tablet Take 1 tablet (25 mg total) by mouth daily. 10/30/19 11/29/19  Donne Hazel, MD  nitroGLYCERIN (NITROSTAT) 0.4 MG SL tablet Place 0.4 mg under the tongue every 5 (five) minutes as needed for chest pain.    [provider]  Nutritional Supplements (PROMOD) LIQD Take 60 mLs by mouth 2 (two) times daily. 05/07/18   [provider]  ondansetron (ZOFRAN) 4 MG tablet Take 4 mg by mouth every 6 (six) hours as needed for nausea or vomiting.    [provider]  polyethylene glycol (MIRALAX / GLYCOLAX) packet Take 17 g by mouth daily.     [provider]   sevelamer (RENAGEL) 800 MG tablet Take 1,600 mg by mouth 3 (three) times daily with meals.  09/03/18   [provider]  traMADol (ULTRAM) 50 MG tablet Take 1 tablet (50 mg total) by mouth daily. 10/29/19   Donne Hazel, MD    Allergies    Patient has no known allergies.  Review of Systems   Review of Systems  Unable to perform ROS: Acuity of condition    Physical Exam Updated Vital Signs BP (!) 158/84 (BP Location: Left Arm)   Resp 16   SpO2 100%   Physical Exam Vitals and nursing note reviewed.  Constitutional:      General: She is in acute distress.     Appearance: She is well-developed. She is obese. She is ill-appearing.  HENT:     Head: Normocephalic and atraumatic.     Nose: Nose normal.     Mouth/Throat:     Mouth: Mucous membranes are dry.  Eyes:     Conjunctiva/sclera: Conjunctivae normal.     Comments: Pupils equal but sluggish bilaterally  Cardiovascular:     Rate and Rhythm: Normal rate and regular rhythm.     Pulses: Normal pulses.     Heart sounds: Normal heart sounds. No murmur.  Pulmonary:     Breath sounds: No stridor. No wheezing.     Comments: King airway in place with bilateral breath sounds Abdominal:     General: There is no distension.     Palpations: Abdomen is soft.  Musculoskeletal:     Cervical back: Neck supple.  Skin:    General: Skin is warm and dry.  Neurological:     GCS: GCS eye subscore is 1. GCS verbal subscore is 1. GCS motor subscore is 1.     Comments: Patient is obtunded     ED Results / Procedures / Treatments   Labs (all labs ordered are listed, but only abnormal results are displayed) Labs Reviewed  CBG MONITORING, ED - Abnormal; Notable for the following components:  Result Value   Glucose-Capillary 110 (*)    All other components within normal limits  RESPIRATORY PANEL BY RT PCR (FLU A&B, COVID)  BASIC METABOLIC PANEL  CBC  TROPONIN I (HIGH SENSITIVITY)    EKG EKG Interpretation  Date/Time:   Thursday December 11 2019 12:47:39 EST Ventricular Rate:  84 PR Interval:  160 QRS Duration: 114 QT Interval:  438 QTC Calculation: 517 R Axis:   81 Text Interpretation: Normal sinus rhythm Biatrial enlargement Possible Inferior infarct , age undetermined Anterior infarct , age undetermined Prolonged QT Abnormal ECG Confirmed by Lennice Sites [phone]) on 12/16/2019 1:06:04 PM   Radiology DG Chest Portable 1 View  Result Date: 11/25/2019 CLINICAL DATA:  Patient intubated.  COVID-19 positive. EXAM: PORTABLE CHEST 1 VIEW COMPARISON:  10/25/2019 FINDINGS: Endotracheal tube tip lies 1 cm above the carina. Nasal/orogastric tube passes below the diaphragm, into the stomach and below the included field of view. Lungs demonstrate prominent bronchovascular markings. Possible subtle right upper and lower lung zone infiltrate. No pleural effusion or pneumothorax. Cardiac silhouette is borderline enlarged. IMPRESSION: 1. Well-positioned endotracheal and nasogastric tubes. 2. Subtle hazy infiltrate suggested in the right lung. No evidence of pulmonary edema. Electronically Signed   By: Lajean Manes M.D.   On: 12/04/2019 12:53    Procedures .Critical Care Performed by: Lennice Sites, DO Authorized by: Lennice Sites, DO   Critical care provider statement:    Critical care time (minutes):  35   Critical care was necessary to treat or prevent imminent or life-threatening deterioration of the following conditions:  Cardiac failure   Critical care was time spent personally by me on the following activities:  Blood draw for specimens, development of treatment plan with patient or surrogate, discussions with primary provider, evaluation of patient's response to treatment, examination of patient, obtaining history from patient or surrogate, ordering and performing treatments and interventions, ordering and review of laboratory studies, ordering and review of radiographic studies, re-evaluation of patient's  condition, review of old charts and pulse oximetry   I assumed direction of critical care for this patient from another provider in my specialty: no    Procedure Name: Intubation Date/Time: 12/06/2019 5:23 PM Performed by: Lennice Sites, DO Pre-anesthesia Checklist: Patient identified, Patient being monitored, Emergency Drugs available, Timeout performed and Suction available Oxygen Delivery Method: Non-rebreather mask Preoxygenation: Pre-oxygenation with 100% oxygen Induction Type: Rapid sequence Ventilation: Mask ventilation without difficulty Laryngoscope Size: Glidescope and 3 Grade View: Grade I Tube size: 7.5 mm Number of attempts: 1 Airway Equipment and Method: Rigid stylet and Video-laryngoscopy Placement Confirmation: ETT inserted through vocal cords under direct vision,  CO2 detector and Breath sounds checked- equal and bilateral Secured at: 22 cm Tube secured with: ETT holder Dental Injury: Teeth and Oropharynx as per pre-operative assessment  Difficulty Due To: Difficulty was unanticipated Future Recommendations: Recommend- induction with short-acting agent, and alternative techniques readily available      (including critical care time)  Medications Ordered in ED Medications  fentaNYL (SUBLIMAZE) 100 MCG/2ML injection (has no administration in time range)  midazolam (VERSED) 2 MG/2ML injection (has no administration in time range)  fentaNYL 2540mcg in NS 256mL (10mcg/ml) infusion-PREMIX (has no administration in time range)  Heparin (Porcine) in NaCl 1000-0.9 UT/500ML-% SOLN (500 mLs  Given 12/04/2019 1304)  calcium chloride injection (1 g Intravenous Given 12/17/2019 1230)  sodium bicarbonate injection (50 mEq Intravenous Given 12/02/2019 1231)  rocuronium (ZEMURON) injection (80 mg Intravenous Given 12/12/2019 1237)  etomidate (AMIDATE) injection (20 mg Intravenous  Given 12/09/2019 1236)  sodium chloride flush (NS) 0.9 % injection 3 mL (3 mLs Intravenous Given 12/16/2019  1246)  fentaNYL (SUBLIMAZE) injection (50 mcg Intravenous Given 12/08/2019 1245)  midazolam (VERSED) injection (2 mg Intravenous Given 12/23/2019 1245)    ED Course  I have reviewed the triage vital signs and the nursing notes.  Pertinent labs & imaging results that were available during my care of the patient were reviewed by me and considered in my medical decision making (see chart for details).    MDM Rules/Calculators/A&P                       TALIA HOHEISEL is a 63 year old female with deafness, blindness, CKD on hemodialysis, CAD who presents to the ED after cardiac arrest.  Patient arrives with normal vitals.  Good pulses.  King airway is in place.  Patient had a witnessed cardiac arrest.  Patient had shockable rhythm initially and then had PEA.  When ROSC was achieved, EKG with EMS shows inferior ST elevation MI.  Patient was given calcium and bicarb upon arrival as well with me given that patient is a dialysis patient.  King airway was switched out with ET tube with etomidate and roc.  Interventional cardiology at the bedside upon patient arrival as well.  Access was obtained.  Chest x-ray showed adequate ET tube placement.  Likely aspiration pneumonia on the right.  Patient went directly to the Cath Lab.  Patient had EKG here that showed sinus rhythm.  ST changes are no longer strongly apparent.  However given EKG findings in the field patient taken to the Cath Lab for emergent cardiac catheterization.  Lab work is pending.  Hemodynamically stable throughout my care.  Patient was given fentanyl and Versed for sedation.  This chart was dictated using voice recognition software.  Despite best efforts to proofread,  errors can occur which can change the documentation meaning.     Final Clinical Impression(s) / ED Diagnoses Final diagnoses:  Cardiac arrest [facility])  ST elevation myocardial infarction (STEMI), unspecified artery [facility])    Rx / DC Orders ED Discharge Orders    None        Lennice Sites, DO 11/28/2019 [place], [place], DO 12/23/2019 1724

## 2019-12-11 NOTE — Progress Notes (Signed)
Responded to [employer].  Obtained patient son's name and called him.  He was at work and sounded busy.  Identified myself and let him know Chaplain here for him if he needs [place].  Visited w/ patient in Cath Lab prior to being transported to ICU.  De Burrs Chaplain Resident

## 2019-12-11 NOTE — ED Triage Notes (Signed)
Pt walking with physical therapy when she collapsed suddenly. CPR started 1145. 1 shock delivered with AED. Initial rhythm PEA for EMS. 2 rounds of Epi. Pulses returned 1200. King airway in place. BP 114/52. EKG shows STEMI

## 2019-12-11 NOTE — Progress Notes (Signed)
Critical PTT and troponin values called to RN. Expected results as pt just returned from cath lab. Pharmacy aware of PTT.

## 2019-12-11 NOTE — ED Notes (Signed)
Cardiology updated pt's son. Pt going to cath lab

## 2019-12-11 NOTE — CV Procedure (Signed)
Central Venous Catheter Insertion Procedure Note [name] [id number] [birth year]    Procedure: Insertion of Central Venous Catheter Indications: Drug and/or fluid administration   Procedure Details Consent: Risks of procedure as well as the alternatives and risks of each were explained to the (patient/caregiver).  Consent for procedure obtained. Time Out: Verified patient identification, verified procedure, site/side was marked, verified correct patient position, special equipment/implants available, medications/allergies/relevent history reviewed, required imaging and test results available.  Performed   Maximum sterile technique was used including antiseptics, cap, gloves, gown, hand hygiene, mask and sheet. Skin prep: Chlorhexidine; local anesthetic administered A antimicrobial bonded/coated triple lumen catheter was placed in the right femoral vein using the Seldinger technique and u/s guidance. (no other access due to HD patient)   Evaluation Blood flow good Complications: No apparent complications Patient did tolerate procedure well.    Glori Bickers, MD  9:38 PM

## 2019-12-11 NOTE — Procedures (Signed)
Patient Name: [name]  MRN: [id number]  Epilepsy Attending: Lora Havens  Referring Physician/Provider: Dr Harrell Gave End Date: 12/12/2019 Duration: 21.39 mins  Patient history: 63yo F s/p cardiac arrest on TTM. EEG to evaluate for seizure  Level of alertness: comatose  AEDs during EEG study: Propofol  Technical aspects: This EEG study was done with scalp electrodes positioned according to the 10-20 International system of electrode placement. Electrical activity was acquired at a sampling rate of 500Hz  and reviewed with a high frequency filter of 70Hz  and a low frequency filter of 1Hz . EEG data were recorded continuously and digitally stored.   DESCRIPTION: EEG showed generalized background suppression. EEG was not reactive to tactile stimulation.  Hyperventilation and photic stimulation were not performed.  ABNORMALITY - Background suppression, generalized   IMPRESSION: This study is suggestive of profound severe encephalopathy, non specific to etiology. [name] seizures or epileptiform discharges were seen throughout the recording.  Teckla Christiansen Barbra Sarks

## 2019-12-11 NOTE — ED Notes (Signed)
King airway exchanged by EPD with ETT

## 2019-12-11 NOTE — Progress Notes (Signed)
[facility] Progress Note Patient Name: [name] DOB: 07-10-1956 MRN: [id number]   Date of Service  12/04/2019  HPI/Events of Note  Bradycardia - HR = 46. BP = 114/42 with MAP = 62.  Patient in Propofol, Fentanyl and Nimbex  IV infusions.  eICU Interventions  Will order: 1. D/C Propofol IV infusion. 2. Versed IV infusion. Titrate to RASS = -2 or ordered BIS.      Intervention Category Major Interventions: Arrhythmia - evaluation and management  Draysen Weygandt Eugene 12/08/2019, 8:06 PM

## 2019-12-11 NOTE — H&P (Addendum)
Cardiology Admission History and Physical:   Patient ID: [name] MRN: [id number]; DOB: 1956/04/06   Admission date: 12/12/2019  Primary Care Provider: Patient, No Pcp Per Primary Cardiologist: No primary care provider on file.  Primary Electrophysiologist:  None   Chief Complaint:  Cardiac Arrest  Patient Profile:   [name] is a 63 y.o. female with ESRD on HD, Anemia, Blind/Deaf, CHF EF 20%, DM, HTN, HL, pulmonary HTN, who presented as a witnessed cardiac arrest while doing [name] at SNF. Code STEMI called and brought to the cath lab emergently.   History of Present Illness:   [name] is a 63 yo female with PMH noted above.  She suffered a VT arrest in 2015 and underwent 6 minutes of CPR.  During that admission she was treated with amiodarone.  Echocardiogram showed an EF of 30 to 35% with severe hypokinesis of the mid apical anterior septal and apical myocardium.  She was seen in the office by Dr. Debara Pickett back in 2016 and [name] discussion was had with the patient, and son regarding her CODE STATUS.  Given she was not considered a candidate for AICD placement it was recommended that she be a DO NOT RESUSCITATE as she was likely to have recurrent VT in the future.  Difficulty to going hypotension and need for dialysis.  Notes indicate that palliative care met with the patient's son but at that time he was not ready to transition to a DNR or comfort care.  Most recently she was admitted 10/22/2019 with worsening shortness of breath in the setting of Covid infection.  Troponin was noted at 3000, but denied any angina and no EKG changes were noted.  Patient her sats were noted to be as low as 80%.  She completed course of remdesivir along with dexamethasone.  Echo that admission showed an EF of 25 to 30% with severe grade 3 diastolic dysfunction.  She was maintained on aspirin, Plavix and Toprol.  Remained euvolemic.  Of note palliative care was consulted at admission and recommended palliative  care referral at [facility].   Today while working with [name] she had a witnessed cardiac arrest.  AED was placed and shock advised.  Patient was defibrillated with PEA noted.  After 2 additional CPR ROSC was obtained.  She was given 2 rounds of epi.  In the ED was noted to be unresponsive but with a pulse.  She was intubated with a King airway which was swapped to an ET tube on arrival.  EKG showed anterior lateral and inferior ST elevation.  Dr. Saunders Revel discussed with the patient's son who reported she was not actively receiving hospice care and would like all interventions done.  She was brought to the Cath Lab for emergent cardiac cath.  Heart Pathway Score:     Past Medical History:  Diagnosis Date  . Anemia    Due to fibroids.  . Anemia in CKD (chronic kidney disease) 12/14/2014  . Benign hypertension with ESRD (Ronnae Kaser-stage renal disease) ([place]) 02/12/2018  . Bilateral leg edema    a. Chronic.  Marland Kitchen Blind   . Cardiac arrest ([place]) 12/12/2014  . CHF (congestive heart failure) ([place])   . Coronary artery disease   . Deaf    USE SIGN INTERPRETER.  Divorced from husband but lives with him. Has daughter but she does not care for her.  . Diabetes mellitus    a. Per PCP note 2012 (A1C 9.2) - [name] unwilling to take meds and  was educated on risk of uncontrolled DM.  b. A1C 5.9 in 11/2013.  Marland Kitchen ESRD on hemodialysis ([place])    Mon, Wed, Fri  . Essential hypertension 08/09/2009   Qualifier: Diagnosis of  By: Sarita Haver  MD, Coralyn Mark    . Fibroid uterus   . Heart murmur   . Hyperlipidemia   . Hypertension    a. Has previously refused blood pressure meds.   . Leiomyoma of uterus 08/09/2009   Qualifier: Diagnosis of  By: Sarita Haver  MD, Coralyn Mark    . MI (myocardial infarction) ([place]) 11/2014  . Moderate tricuspid regurgitation   . Osteomyelitis ([place])   . Psychiatric disorder    She frequently exhibits paranoia and has been diagnosed with psychotic d/o NOS during hospital stay in the past. She is tangetial and  perseverative during her visits. She apparently has had a bad experience with mental health in [place] in the past and refuses to discuss mental issues for fear that she will be sent back there. Her paranoia, communication issues, financial woes and lack of fam  . Pulmonary hypertension ([place])     Past Surgical History:  Procedure Laterality Date  . AMPUTATION Bilateral 04/02/2015   Procedure: Amputation Bilateral Great Toes MTP Joint;  Surgeon: Newt Minion, MD;  Location: [facility];  Service: Orthopedics;  Laterality: Bilateral;  . AMPUTATION Left 05/14/2015   Procedure: Left Transmetatarsal Amputation;  Surgeon: Newt Minion, MD;  Location: [facility];  Service: Orthopedics;  Laterality: Left;  . AMPUTATION Right 08/20/2015   Procedure: Transmetatarsal Amputation Right Foot;  Surgeon: Newt Minion, MD;  Location: [facility];  Service: Orthopedics;  Laterality: Right;  . AV FISTULA PLACEMENT Left 01/02/2014   Procedure: ARTERIOVENOUS (AV) FISTULA CREATION;  Surgeon: Rosetta Posner, MD;  Location: [facility];  Service: Vascular;  Laterality: Left;  . INSERTION OF DIALYSIS CATHETER Right 01/02/2014   Procedure: INSERTION OF DIALYSIS CATHETER;  Surgeon: Rosetta Posner, MD;  Location: [facility];  Service: Vascular;  Laterality: Right;  . NO PAST SURGERIES    . ORIF PATELLA Left 05/17/2015   Procedure: OPEN REDUCTION INTERNAL (ORIF) FIXATION PATELLA;  Surgeon: Newt Minion, MD;  Location: [facility];  Service: Orthopedics;  Laterality: Left;     Medications Prior to Admission: Prior to Admission medications   Medication Sig Start Date Kalijah Westfall Date Taking? Authorizing Provider  albuterol (VENTOLIN HFA) 108 (90 Base) MCG/ACT inhaler Inhale 2 puffs into the lungs every 6 (six) hours. 10/29/19   Donne Hazel, MD  amiodarone (PACERONE) 200 MG tablet Take 200 mg by mouth every evening.     [provider]  amLODipine (NORVASC) 5 MG tablet Take 5 mg by mouth daily.    [provider]  ARIPiprazole (ABILIFY) 15 MG tablet Take  15 mg by mouth daily.  05/17/18   [provider]  atorvastatin (LIPITOR) 10 MG tablet Take 10 mg by mouth every evening.    [provider]  azithromycin (ZITHROMAX) 250 MG tablet Take one tab PO daily x 2 more days, then stop. Zero refills 10/29/19   Donne Hazel, MD  Cholecalciferol (VITAMIN D3) 50 MCG (2000 UT) capsule Take 2,000 Units by mouth daily.    [provider]  clopidogrel (PLAVIX) 75 MG tablet Take 75 mg by mouth every evening.    [provider]  Dextran 70-Hypromellose (ARTIFICIAL TEARS) 0.1-0.3 % SOLN Apply 1 drop to eye every 12 (twelve) hours as needed (dryness).     [provider]  folic acid-vitamin b complex-vitamin c-selenium-zinc (DIALYVITE) 3 MG TABS tablet Take 1 tablet by mouth daily.    [provider]  insulin lispro (ADMELOG) 100 UNIT/ML injection Inject 0-5 Units into the skin See admin instructions. Per Sliding Scale twice dialy: 0-150 0units 151-400 5units    [provider]  metoprolol succinate (TOPROL-XL) 25 MG 24 hr tablet Take 1 tablet (25 mg total) by mouth daily. 10/30/19 11/29/19  Donne Hazel, MD  nitroGLYCERIN (NITROSTAT) 0.4 MG SL tablet Place 0.4 mg under the tongue every 5 (five) minutes as needed for chest pain.    [provider]  Nutritional Supplements (PROMOD) LIQD Take 60 mLs by mouth 2 (two) times daily. 05/07/18   [provider]  ondansetron (ZOFRAN) 4 MG tablet Take 4 mg by mouth every 6 (six) hours as needed for nausea or vomiting.    [provider]  polyethylene glycol (MIRALAX / GLYCOLAX) packet Take 17 g by mouth daily.     [provider]  sevelamer (RENAGEL) 800 MG tablet Take 1,600 mg by mouth 3 (three) times daily with meals.  09/03/18   [provider]  traMADol (ULTRAM) 50 MG tablet Take 1 tablet (50 mg total) by mouth daily. 10/29/19   Donne Hazel, MD     Allergies:   No Known Allergies  Social History:   Social  History   Socioeconomic History  . Marital status: Widowed    Spouse name: Not on file  . Number of children: Not on file  . Years of education: Not on file  . Highest education level: Not on file  Occupational History  . Not on file  Tobacco Use  . Smoking status: Never Smoker  . Smokeless tobacco: Never Used  . Tobacco comment: Prior dip  Substance and Sexual Activity  . Alcohol use: No  . Drug use: No  . Sexual activity: Not on file  Other Topics Concern  . Not on file  Social History Narrative  . Not on file   Social Determinants of Health   Financial Resource Strain:   . Difficulty of Paying Living Expenses: Not on file  Food Insecurity:   . Worried About Charity fundraiser in the Last Year: Not on file  . Ran Out of Food in the Last Year: Not on file  Transportation Needs:   . Lack of Transportation (Medical): Not on file  . Lack of Transportation (Non-Medical): Not on file  Physical Activity:   . Days of Exercise per Week: Not on file  . Minutes of Exercise per Session: Not on file  Stress:   . Feeling of Stress : Not on file  Social Connections:   . Frequency of Communication with Friends and Family: Not on file  . Frequency of Social Gatherings with Friends and Family: Not on file  . Attends Religious Services: Not on file  . Active Member of Clubs or Organizations: Not on file  . Attends Archivist Meetings: Not on file  . Marital Status: Not on file  Intimate Partner Violence:   . Fear of Current or Ex-Partner: Not on file  . Emotionally Abused: Not on file  . Physically Abused: Not on file  . Sexually Abused: Not on file    Family History:   The patient's family history includes Heart disease in an other family member; Other in her father.    ROS:  Please see the history of present illness.  All other  ROS reviewed and negative.     Physical Exam/Data:   Vitals:   12/09/2019 1230  BP: (!) 158/84  Resp: 16  SpO2: 100%   No intake or  output data in the 24 hours ending 12/06/2019 1304 Last 3 Weights 10/29/2019 10/28/2019 10/28/2019  Weight (lbs) 217 lb 2.5 oz 212 lb 8.4 oz 222 lb 7.1 oz  Weight (kg) 98.5 kg 96.4 kg 100.9 kg     There is no height or weight on file to calculate BMI.    Exam per MD  EKG:  The ECG that was done 12/19/2019 was personally reviewed and demonstrates SR with anterolateral and inferior ST elevation  Relevant CV Studies:  TTE: 10/26/19  IMPRESSIONS    1. Left ventricular ejection fraction, by visual estimation, is 25 to 30%. The left ventricle has severely decreased function. Moderately increased left ventricular size. There is no left ventricular hypertrophy.  2. Multiple segmental abnormalities exist. See findings.  3. Definity contrast agent was given IV to delineate the left ventricular endocardial borders.  4. Left ventricular diastolic parameters are consistent with Grade III diastolic dysfunction (restrictive).  5. No thrombus.  6. Global right ventricle has normal systolic function.The right ventricular size is normal. No increase in right ventricular wall thickness.  7. Left atrial size was moderately dilated.  8. Right atrial size was normal.  9. Moderate mitral annular calcification. 10. The mitral valve is grossly normal. No evidence of mitral valve regurgitation. Moderate mitral stenosis. mild mitral valve stenosis. 11. Mean mitral gradient: 65mHg. 12. The tricuspid valve is normal in structure. Tricuspid valve regurgitation is not demonstrated. 13. The aortic valve is normal in structure. Aortic valve regurgitation is not visualized. No evidence of aortic valve sclerosis or stenosis. 14. The pulmonic valve was normal in structure. Pulmonic valve regurgitation is not visualized. 15. The inferior vena cava is normal in size with greater than 50% respiratory variability, suggesting right atrial pressure of 3 mmHg.  Laboratory Data:  High Sensitivity Troponin:  No results for  input(s): TROPONINIHS in the last 720 hours.    ChemistryNo results for input(s): NA, K, CL, CO2, GLUCOSE, BUN, CREATININE, CALCIUM, GFRNONAA, GFRAA, ANIONGAP in the last 168 hours.  No results for input(s): PROT, ALBUMIN, AST, ALT, ALKPHOS, BILITOT in the last 168 hours. HematologyNo results for input(s): WBC, RBC, HGB, HCT, MCV, MCH, MCHC, RDW, PLT in the last 168 hours. BNPNo results for input(s): BNP, PROBNP in the last 168 hours.  DDimer No results for input(s): DDIMER in the last 168 hours.   Radiology/Studies:  DG Chest Portable 1 View  Result Date: 12/12/2019 CLINICAL DATA:  Patient intubated.  COVID-19 positive. EXAM: PORTABLE CHEST 1 VIEW COMPARISON:  10/25/2019 FINDINGS: Endotracheal tube tip lies 1 cm above the carina. Nasal/orogastric tube passes below the diaphragm, into the stomach and below the included field of view. Lungs demonstrate prominent bronchovascular markings. Possible subtle right upper and lower lung zone infiltrate. No pleural effusion or pneumothorax. Cardiac silhouette is borderline enlarged. IMPRESSION: 1. Well-positioned endotracheal and nasogastric tubes. 2. Subtle hazy infiltrate suggested in the right lung. No evidence of pulmonary edema. Electronically Signed   By: DLajean ManesM.D.   On: 11/30/2019 12:53    Assessment and Plan:   [name] GREIFis a 63y.o. female with ESRD on HD, Anemia, Blind/Deaf, CHF EF 20%, DM, HTN, HL, pulmonary HTN, who presented as a witnessed cardiac arrest while doing [name] at SNF. Code STEMI called and brought to the cath lab  emergently.   1. Cardiac Arrest/Inferior STEMI: had a witnessed arrest while working with [name] at [facility] today. One shock, 2 rounds of CPR with 2 epi then ROSC obtained. EKG reviewed by Dr. Saunders Revel and discussion had with her son who wants to continue with full scope of care. She was brought to the cath lab for emergent cardiac catheterization. Further management pending cath.   2. Acute respiratory failure: King airway  switched to ETT while in the ED. Will consulted PCCM for assistance.  3. ESRD on HD: notes indicated she a MWF treatment schedule. Will consult nephrology for HD needs.    4. Chronic combined HF: EF 20-25% with G3DD on echo 10/26/19. Therapy has been limited in the past 2/2 to soft blood pressures. Last admission was discharged on Toprol.   5. Recent COVID admission with PNA: treated with remdesivir and dexamethasone.   Severity of Illness: The appropriate patient status for this patient is INPATIENT. Inpatient status is judged to be reasonable and necessary in order to provide the required intensity of service to ensure the patient's safety. The patient's presenting symptoms, physical exam findings, and initial radiographic and laboratory data in the context of their chronic comorbidities is felt to place them at high risk for further clinical deterioration. Furthermore, it is not anticipated that the patient will be medically stable for discharge from the hospital within 2 midnights of admission. The following factors support the patient status of inpatient.   " The patient's presenting symptoms include cardiac arrest. " The worrisome physical exam findings include intubated. " The initial radiographic and laboratory data are worrisome because of EKG with concern for inferior STEMI. " The chronic co-morbidities include ESRD, HTN, CHF, DM, recent COVID PNA.   * I certify that at the point of admission it is my clinical judgment that the patient will require inpatient hospital care spanning beyond 2 midnights from the point of admission due to high intensity of service, high risk for further deterioration and high frequency of surveillance required.*    For questions or updates, please contact [place] Please consult [email or web address] for contact info under   Signed, Reino Bellis, NP  12/09/2019 1:04 PM   I have independently seen and examined the patient and agree with the findings  and plan, as documented in the PA/NP's note, with the following additions/changes.  [name] is a 63 y/o woman with multiple chronic medical problems, including CAD with reported anterior MI in 2015 (no catheterization was every performed), chronic systolic heart failure due to ischemic cardiomyopathy,  PAD s/p bilateral forefoot amputations, ESRD and hemodialysis, HTN, HLD, DM, and hearing/vision impairment, presenting to [facility] via EMS after cardiac arrest.  She was reportedly working with [name] when she suddenly collapsed.  CPR was started and single shock administered via AED when first responders arrived.  PEA was noted when EMS reached the scene; ROSC subsequently obtained with continued CPR.  Post-ROSC EKG showed anterolateral and inferior ST elevation.  In the ED, the patient was unresponsive and was intubated for airway protection.  Repeat EKG showed nonspecific ST changes and evidence of prior anterolateral and inferior MI.  Given suspected VT/VF arrest, the patient was taken for emergent LHC.  Occlusions of the mid LAD and ostial RCA were observed.  PTCA of the mid LAD was performed with reestablishment of TIMI-3 flow.  Stent was not placed due to small vessel size and heavy calcification.  Exam notable for RRR w/o murmurs.  Lungs are clear anteriorly.  There  is no LE edema.  Patient is intubated and unresponsive.  Labs notable for initial HS-TnI of 24.  K 3.7.  Hemoglobin 10.3.  I suspect that cardiac arrest was most likely a primary arrhythmic event in the setting of severely reduced LVEF with ischemic substrate.  The patient is now s/p PTCA to the LAD.  Hypothermia protocol to be considered by CCM.  Norepinephrine was started during cath due to hypotension, which can continue to be weaned as tolerated.  The patient will need to continue DAPT with ASA and clopidogrel for at least 12 months.  Statin therapy to be continued with escalation if LDL is > 70.  I will start IV amiodarone infusion, to be  transitioned to PO dosing after 24 hours if the patient remains electrically stable.  I recommend addition of low-dose beta-blocker when blood pressure allows.  Based on patient's neurologic recover, ICD placement for secondary prevention will need to be readdressed with EP.  [name], MD [facility]

## 2019-12-11 NOTE — Brief Op Note (Signed)
BRIEF CARDIAC CATHETERIZATION NOTE  12/23/2019  2:39 PM  PATIENT:  [name]  63 y.o. female  PRE-OPERATIVE DIAGNOSIS:  Cardiac arrest, STEMI  POST-OPERATIVE DIAGNOSIS:  Cardiac arrest, STEMI  PROCEDURE:  Procedure(s): CORONARY/GRAFT ACUTE MI REVASCULARIZATION (N/A) CORONARY STENT INTERVENTION (N/A)  SURGEON:  Surgeon(s) and Role:    * Gavino Fouch, Harrell Gave, MD - Primary  FINDINGS: 1. Severe 2-vessel CAD with CTO of ostial RCA and subacute or chronic total occlusion of mid LAD. 2. Severely reduced LVEF with mid and apical akinesis. 3. Mildly elevated LVEDP. 4. PTCA of mid LAD and D2 with reestablishment of TIMI-3 flow.  Given small vessel size and heavy calcification, stent placement was not attempted.  RECOMMENDATIONS: 1. Post-arrest care per CCM. 2. ASA and clopidogrel for at least 12 months. 3. Amiodarone infusion x 24 hours. 4. Initiate evidence based HF therapy as blood pressure tolerates. 5. Wean norepinephrine infusion, as BP allows.  [name], MD [facility]

## 2019-12-11 NOTE — Procedures (Signed)
Arterial Catheter Insertion Procedure Note [name] [id number] February 07, 1956    Procedure: Insertion of Arterial Catheter  Indications: Blood pressure monitoring  Procedure Details Consent: Risks of procedure as well as the alternatives and risks of each were explained to the (patient/caregiver).  Consent for procedure obtained. Time Out: Verified patient identification, verified procedure, site/side was marked, verified correct patient position, special equipment/implants available, medications/allergies/relevent history reviewed, required imaging and test results available.  Time out performed   Maximum sterile technique was used including antiseptics, cap, gloves, gown, hand hygiene, mask and sheet. Skin prep: Chlorhexidine; local anesthetic administered 20 gauge catheter was inserted into right radial artery using the Seldinger technique. ULTRASOUND GUIDANCE USED: YES Evaluation Blood flow good; BP tracing good. Complications: No apparent complications.   Minda Ditto 12/23/2019

## 2019-12-11 NOTE — Progress Notes (Signed)
Assessws this patient to place a PIV. This patient is not a candidate for a midline d/t crcl 10.895 and on amiodarone. Poor vasculature and unable to find accessible vein. Notified patient's nurse. VU. Fran Lowes, RN VAST

## 2019-12-11 NOTE — H&P (Signed)
NAME:  [name], MRN:  [id number], DOB:  Oct 22, 1956, LOS: 0 ADMISSION DATE:  12/21/2019, CONSULTATION DATE: 12/20/2019 REFERRING MD: Dr. Saunders Revel, Cardiology, CHIEF COMPLAINT: Cardiac arrest, acute respiratory failure  Brief History   11, DM, CAD with CM, COVID-19 pneumonia early November.  Admitted 12/17 from SNF after acute arrest, presumed ventricular arrhythmia.  CPR 15 minutes. Partially successful PTCI LAD 12/17, no stent.  To ICU for TTM 33.   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 She is deaf and blind, was admitted for COVID-19 pneumonia 10/31 - 10/29/2019, treated with dexamethasone and discharged to SNF.  She experienced a witnessed arrest while working with physical therapy 12/17, received immediate CPR.  Initial AED evaluation showed a shockable rhythm.  Subsequent rhythm was PEA.  CPR lasted approximately 15 minutes.  She was unresponsive in the emergency department, [facility] airway exchanged for ET tube.  EKG showed anterior lateral inferior ST elevations.  She went to the Cath Lab emergently for possible PTCI.  She returned to the ICU ventilated, on low-dose norepinephrine.  Past Medical History   has a past medical history of Anemia, Anemia in CKD (chronic kidney disease) (12/14/2014), Benign hypertension with ESRD (end-stage renal disease) ([place]) (02/12/2018), Bilateral leg edema, Blind, Cardiac arrest ([place]) (12/12/2014), CHF (congestive heart failure) ([place]), Coronary artery disease, Deaf, Diabetes mellitus, ESRD on hemodialysis ([place]), Essential hypertension (08/09/2009), Fibroid uterus, Heart murmur, Hyperlipidemia, Hypertension, Leiomyoma of uterus (08/09/2009), MI (myocardial infarction) ([place]) (11/2014), Moderate tricuspid regurgitation, Osteomyelitis ([place]), Psychiatric disorder, and Pulmonary hypertension ([place]).   Significant Hospital Events   Cardiac arrest  12/17 Cardiac catheterization 12/17  Consults:  Cardiology Nephrology  Procedures:  Left heart catheterization 12/17 >>   Significant Diagnostic Tests:  TTE 12/17 >>   Micro Data:    Antimicrobials:    Interim history/subjective:    Objective   Blood pressure (!) 158/84, resp. rate 16, height 5\' 5"  (1.651 m), SpO2 100 %.    Vent Mode: PRVC FiO2 (%):  [100 %] 100 % Set Rate:  [16 bmp] 16 bmp Vt Set:  [420 mL-450 mL] 450 mL PEEP:  [5 cmH20] 5 cmH20 Plateau Pressure:  [18 cmH20] 18 cmH20  No intake or output data in the 24 hours ending 12/23/2019 1405 There were no vitals filed for this visit.  Examination: General: Obese woman, ventilated, unresponsive HENT: ET tube in place.  Eyes difficult to open pupils 2 to 3 mm, nonreactive Lungs: Clear bilaterally, ventilated breath sounds Cardiovascular: Regular, distant, sinus rhythm on telemetry Abdomen: Obese, nondistended, hypoactive bowel sounds Extremities: She has bilateral metatarsal amputations, no significant edema, skin cool Skin: Unstaged sacral pressure ulcer noted and dressed Neuro: Unresponsive to voice, stimulation, pain GU: Foley catheter  Resolved Hospital Problem list     Assessment & Plan:   Acute cardiac arrest, presumed VT/VF given her history of CAD and prior ventricular arrhythmias, shockable initial rhythm with AED. -Based on the short duration of CPR and the presumed initial rhythm of VT/VF favor TTM 33 even with her known comorbid conditions. -Sedation, paralytics, serial labs as per hypothermia protocol -On amiodarone as an outpatient for atrial fibrillation.  Plan to likely continue given presumed ventricular arrhythmia, will discuss with cardiology  Coronary artery disease, acute STEMI -Post cardiac catheterization with attempted PTCI to LAD, only partially successful, no stent placed -Anticoagulation and ASA, antiplatelets post cardiac catheterization.  Appreciate cardiology management -Plan  for  echocardiogram  Acute respiratory failure with hypoxemia due to cardiac arrest -Plan for ventilator management with PRVC 8 cc/kg, wean PEEP and FiO2 -Initiate SBT after rewarming depending on mental status -VAP prevention order set  Shock, likely cardiogenic with possible component of sedating medications -Plan for CVC placement, transduce CVP to guide volume management -On low-dose norepinephrine, wean as able  End-stage renal disease, chronic dialysis -Notified nephrology of her admission.  No clear emergent indication for hemodialysis at this time  Anion gap metabolic acidosis -Check lactic acid and follow -Check BMP  History of hypertension -Home antihypertensive medications on hold  Diabetes mellitus -ICU hyperglycemia protocol  Acute encephalopathy, consider toxic metabolic.  Also at risk for possible hypoxemic injury. -TTM as above -Assess neurological status after rewarming.  Chronic sacral pressure ulcer present at the time of admission -WOC consult and assistance  Best practice:  Diet: N.p.o. Pain/Anxiety/Delirium protocol (if indicated): Propofol, fentanyl VAP protocol (if indicated): Ordered 12/17 DVT prophylaxis: Systemic anticoagulation post catheterization GI prophylaxis: PPI Glucose control: ICU Hg protocol Mobility: BR Code Status: Full Family Communication: Patient's son [name] was called by Dr End with cardiology, updated. All aggressive interventions were desired.  Disposition: cath lab to ICU  Labs   CBC: Recent Labs  Lab 12/23/2019 1241  WBC 10.1  HGB 10.3*  HCT 32.6*  MCV 102.5*  PLT 450    Basic Metabolic Panel: Recent Labs  Lab 12/14/2019 1241  NA 138  K 3.7  CL 94*  CO2 26  GLUCOSE 181*  BUN 20  CREATININE 6.54*  CALCIUM 9.2   GFR: CrCl cannot be calculated (Unknown ideal weight.). Recent Labs  Lab 12/15/2019 1241  WBC 10.1    Liver Function Tests: No results for input(s): AST, ALT, ALKPHOS, BILITOT, PROT, ALBUMIN  in the last 168 hours. No results for input(s): LIPASE, AMYLASE in the last 168 hours. No results for input(s): AMMONIA in the last 168 hours.  ABG    Component Value Date/Time   PHART 7.386 12/12/2014 0606   PCO2ART 45.3 (H) 12/12/2014 0606   PO2ART 204.0 (H) 12/12/2014 0606   HCO3 27.4 (H) 12/12/2014 0606   TCO2 27 04/30/2018 2217   ACIDBASEDEF 7.3 (H) 12/26/2013 0928   O2SAT 100.0 12/12/2014 0606     Coagulation Profile: No results for input(s): INR, PROTIME in the last 168 hours.  Cardiac Enzymes: No results for input(s): CKTOTAL, CKMB, CKMBINDEX, TROPONINI in the last 168 hours.  HbA1C: Hemoglobin A1C  Date/Time Value Ref Range Status  04/23/2018 12:00 AM 5.5  Final  12/11/2017 12:00 AM 5.4  Final   Hgb A1c MFr Bld  Date/Time Value Ref Range Status  10/26/2019 10:09 AM 5.4 4.8 - 5.6 % Final    Comment:    (NOTE) Pre diabetes:          5.7%-6.4% Diabetes:              >6.4% Glycemic control for   <7.0% adults with diabetes     CBG: Recent Labs  Lab 11/30/2019 1233  GLUCAP 110*    Review of Systems:   Unable to obtain  Past Medical History  She,  has a past medical history of Anemia, Anemia in CKD (chronic kidney disease) (12/14/2014), Benign hypertension with ESRD (end-stage renal disease) ([place]) (02/12/2018), Bilateral leg edema, Blind, Cardiac arrest ([place]) (12/12/2014), CHF (congestive heart failure) ([place]), Coronary artery disease, Deaf, Diabetes mellitus, ESRD on hemodialysis ([place]), Essential hypertension (08/09/2009), Fibroid uterus, Heart murmur, Hyperlipidemia, Hypertension, Leiomyoma of uterus (08/09/2009), MI (  myocardial infarction) ([place]) (11/2014), Moderate tricuspid regurgitation, Osteomyelitis ([place]), Psychiatric disorder, and Pulmonary hypertension ([place]).   Surgical History    Past Surgical History:  Procedure Laterality Date  . AMPUTATION Bilateral 04/02/2015   Procedure: Amputation Bilateral Great Toes MTP Joint;  Surgeon: Newt Minion, MD;   Location: [place];  Service: Orthopedics;  Laterality: Bilateral;  . AMPUTATION Left 05/14/2015   Procedure: Left Transmetatarsal Amputation;  Surgeon: Newt Minion, MD;  Location: [place];  Service: Orthopedics;  Laterality: Left;  . AMPUTATION Right 08/20/2015   Procedure: Transmetatarsal Amputation Right Foot;  Surgeon: Newt Minion, MD;  Location: [place];  Service: Orthopedics;  Laterality: Right;  . AV FISTULA PLACEMENT Left 01/02/2014   Procedure: ARTERIOVENOUS (AV) FISTULA CREATION;  Surgeon: Rosetta Posner, MD;  Location: [facility];  Service: Vascular;  Laterality: Left;  . INSERTION OF DIALYSIS CATHETER Right 01/02/2014   Procedure: INSERTION OF DIALYSIS CATHETER;  Surgeon: Rosetta Posner, MD;  Location: [place];  Service: Vascular;  Laterality: Right;  . NO PAST SURGERIES    . ORIF PATELLA Left 05/17/2015   Procedure: OPEN REDUCTION INTERNAL (ORIF) FIXATION PATELLA;  Surgeon: Newt Minion, MD;  Location: [facility];  Service: Orthopedics;  Laterality: Left;     Social History   reports that she has never smoked. She has never used smokeless tobacco. She reports that she does not drink alcohol or use drugs.   Family History   Her family history includes Heart disease in an other family member; Other in her father.   Allergies No Known Allergies   Home Medications  Prior to Admission medications   Medication Sig Start Date End Date Taking? Authorizing Provider  albuterol (VENTOLIN HFA) 108 (90 Base) MCG/ACT inhaler Inhale 2 puffs into the lungs every 6 (six) hours. 10/29/19   Donne Hazel, MD  amiodarone (PACERONE) 200 MG tablet Take 200 mg by mouth every evening.     [provider]  amLODipine (NORVASC) 5 MG tablet Take 5 mg by mouth daily.    [provider]  ARIPiprazole (ABILIFY) 15 MG tablet Take 15 mg by mouth daily.  05/17/18   [provider]  atorvastatin (LIPITOR) 10 MG tablet Take 10 mg by mouth every evening.    [provider]  azithromycin  (ZITHROMAX) 250 MG tablet Take one tab PO daily x 2 more days, then stop. Zero refills 10/29/19   Donne Hazel, MD  Cholecalciferol (VITAMIN D3) 50 MCG (2000 UT) capsule Take 2,000 Units by mouth daily.    [provider]  clopidogrel (PLAVIX) 75 MG tablet Take 75 mg by mouth every evening.    [provider]  Dextran 70-Hypromellose (ARTIFICIAL TEARS) 0.1-0.3 % SOLN Apply 1 drop to eye every 12 (twelve) hours as needed (dryness).     [provider]  folic acid-vitamin b complex-vitamin c-selenium-zinc (DIALYVITE) 3 MG TABS tablet Take 1 tablet by mouth daily.    [provider]  insulin lispro (ADMELOG) 100 UNIT/ML injection Inject 0-5 Units into the skin See admin instructions. Per Sliding Scale twice dialy: 0-150 0units 151-400 5units    [provider]  metoprolol succinate (TOPROL-XL) 25 MG 24 hr tablet Take 1 tablet (25 mg total) by mouth daily. 10/30/19 11/29/19  Donne Hazel, MD  nitroGLYCERIN (NITROSTAT) 0.4 MG SL tablet Place 0.4 mg under the tongue every 5 (five) minutes as needed for chest pain.    [provider]  Nutritional Supplements (PROMOD) LIQD  Take 60 mLs by mouth 2 (two) times daily. 05/07/18   [provider]  ondansetron (ZOFRAN) 4 MG tablet Take 4 mg by mouth every 6 (six) hours as needed for nausea or vomiting.    [provider]  polyethylene glycol (MIRALAX / GLYCOLAX) packet Take 17 g by mouth daily.     [provider]  sevelamer (RENAGEL) 800 MG tablet Take 1,600 mg by mouth 3 (three) times daily with meals.  09/03/18   [provider]  traMADol (ULTRAM) 50 MG tablet Take 1 tablet (50 mg total) by mouth daily. 10/29/19   Donne Hazel, MD     Critical care time: 60 minutes      Baltazar Apo, MD, PhD 12/15/2019, 2:58 PM [facility] [phone] or if no answer [phone]

## 2019-12-11 NOTE — Significant Event (Signed)
[facility] STEMI Note  Date: 12/09/2019 Time: 1:04 PM  Patient presented to [facility] ED via EMS after witness cardiac arrest at assisted living.  Pt was reportedly in her usual state of health, participating on PT when she collapsed.  CPR was started.  When FD arrived, AED indicated shock advised and patient was defibrillated to PEA.  After additional CPR, [place] was obtained.  In the ED, patient was unresponsive but with a pulse.  EMS EKG showed anterolateral and inferior ST elevation, somewhat improved and near baseline on repeat tracing.  I spoke with the patient's son [name], who states that his mother is not currently receiving hospice care and that she would like all care necessary to help keep her alive.  I have recommended that we proceed with emergent cardiac catheterization, which he is in agreement with.  [name] Bush, MD [facility]

## 2019-12-11 NOTE — Progress Notes (Signed)
vLTM started  Event button tested.  RN advised about event button.  Neurology notified

## 2019-12-11 NOTE — Consult Note (Signed)
[facility] Renal Consultation Note    Indication for Consultation:  Management of ESRD/hemodialysis; anemia, hypertension/volume and secondary hyperparathyroidism  UXL:[id number], No Pcp Per  HPI: [name] is a 63 y.o. female. ESRD on HD MWF at [facility]  Past medical history significant for HTN, DM, pulm HTN, CHF w/EF 20%, AOCD, SHPT, hx psychiatric disorder.  Patient is blind and deaf.    History obtained from EMR due to patient being sedated and intubated.  Per chart she had witnessed cardiac arrest while doing PT at the SNF and underwent CPR for ~32mn, including 1 shock. Code STEMI called and patient taken to cath lab emergently where she underwent emergent cath showing severe 2 vessel CAD w/ CTO of ostial RCA & subacute or chronic total occlusion of mid LAD - PTCA of mid LAD & D2 w/ reestablishment of TIMI-3 flow, stent not attempted due to small vessel size and heavy calcification.   Review of outpatient records indicate patient return to home unit on 12/2 after testing COVID negative x2 on 11/22/19.  She has intermittent compliance with prescribed dialysis regimen. Last HD on 12/10/19 where she completed her full treatment and left at her estimated dry weight.   Past Medical History:  Diagnosis Date  . Anemia    Due to fibroids.  . Anemia in CKD (chronic kidney disease) 12/14/2014  . Benign hypertension with ESRD (end-stage renal disease) ([facility] 02/12/2018  . Bilateral leg edema    a. Chronic.  .Marland KitchenBlind   . Cardiac arrest ([facility] 12/12/2014  . CHF (congestive heart failure) ([facility]   . Coronary artery disease   . Deaf    USE SIGN INTERPRETER.  Divorced from husband but lives with him. Has daughter but she does not care for her.  . Diabetes mellitus    a. Per PCP note 2012 (A1C 9.2) - pt unwilling to take meds and was educated on risk of uncontrolled DM.  b. A1C 5.9 in 11/2013.  .Marland KitchenESRD on hemodialysis ([facility]    Mon, Wed, Fri  . Essential hypertension 08/09/2009   Qualifier:  Diagnosis of  By: ESarita Haver MD, TCoralyn Mark   . Fibroid uterus   . Heart murmur   . Hyperlipidemia   . Hypertension    a. Has previously refused blood pressure meds.   . Leiomyoma of uterus 08/09/2009   Qualifier: Diagnosis of  By: ESarita Haver MD, TCoralyn Mark   . MI (myocardial infarction) ([facility] 11/2014  . Moderate tricuspid regurgitation   . Osteomyelitis ([facility]   . Psychiatric disorder    She frequently exhibits paranoia and has been diagnosed with psychotic d/o NOS during hospital stay in the past. She is tangetial and perseverative during her visits. She apparently has had a bad [place] and refuses to discuss mental issues for fear that she will be sent back there. Her paranoia, communication issues, financial woes and lack of fam  . Pulmonary hypertension ([facility]    Past Surgical History:  Procedure Laterality Date  . AMPUTATION Bilateral 04/02/2015   Procedure: Amputation Bilateral Great Toes MTP Joint;  Surgeon: MNewt Minion MD;  [place] [facility]  Service: Orthopedics;  Laterality: Bilateral;  . AMPUTATION Left 05/14/2015   Procedure: Left Transmetatarsal Amputation;  Surgeon: MNewt Minion MD;  [place]  Service: Orthopedics;  Laterality: Left;  . AMPUTATION Right 08/20/2015   Procedure: Transmetatarsal Amputation Right Foot;  Surgeon: MNewt Minion MD;  Location: [facility]  Service: Orthopedics;  Laterality: Right;  . AV FISTULA PLACEMENT Left 01/02/2014   Procedure: ARTERIOVENOUS (AV) FISTULA CREATION;  Surgeon: Rosetta Posner, MD;  Location: [place];  Service: Vascular;  Laterality: Left;  . INSERTION OF DIALYSIS CATHETER Right 01/02/2014   Procedure: INSERTION OF DIALYSIS CATHETER;  Surgeon: Rosetta Posner, MD;  Location: [place];  Service: Vascular;  Laterality: Right;  . NO PAST SURGERIES    . ORIF PATELLA Left 05/17/2015   Procedure: OPEN REDUCTION INTERNAL (ORIF) FIXATION PATELLA;  Surgeon: Newt Minion, MD;  Location: [place];  Service: Orthopedics;   Laterality: Left;   Family History  Problem Relation Age of Onset  . Other Father        Drowned from fishing?  . Heart disease Other    Social History:  reports that she has never smoked. She has never used smokeless tobacco. She reports that she does not drink alcohol or use drugs. No Known Allergies Prior to Admission medications   Medication Sig Start Date End Date Taking? Authorizing Provider  albuterol (VENTOLIN HFA) 108 (90 Base) MCG/ACT inhaler Inhale 2 puffs into the lungs every 6 (six) hours. 10/29/19   Donne Hazel, MD  amiodarone (PACERONE) 200 MG tablet Take 200 mg by mouth every evening.     [provider]  amLODipine (NORVASC) 5 MG tablet Take 5 mg by mouth daily.    [provider]  ARIPiprazole (ABILIFY) 15 MG tablet Take 15 mg by mouth daily.  05/17/18   [provider]  atorvastatin (LIPITOR) 10 MG tablet Take 10 mg by mouth every evening.    [provider]  azithromycin (ZITHROMAX) 250 MG tablet Take one tab PO daily x 2 more days, then stop. Zero refills 10/29/19   Donne Hazel, MD  Cholecalciferol (VITAMIN D3) 50 MCG (2000 UT) capsule Take 2,000 Units by mouth daily.    [provider]  clopidogrel (PLAVIX) 75 MG tablet Take 75 mg by mouth every evening.    [provider]  Dextran 70-Hypromellose (ARTIFICIAL TEARS) 0.1-0.3 % SOLN Apply 1 drop to eye every 12 (twelve) hours as needed (dryness).     [provider]  folic acid-vitamin b complex-vitamin c-selenium-zinc (DIALYVITE) 3 MG TABS tablet Take 1 tablet by mouth daily.    [provider]  insulin lispro (ADMELOG) 100 UNIT/ML injection Inject 0-5 Units into the skin See admin instructions. Per Sliding Scale twice dialy: 0-150 0units 151-400 5units    [provider]  metoprolol succinate (TOPROL-XL) 25 MG 24 hr tablet Take 1 tablet (25 mg total) by mouth daily. 10/30/19 11/29/19  Donne Hazel, MD  nitroGLYCERIN (NITROSTAT) 0.4  MG SL tablet Place 0.4 mg under the tongue every 5 (five) minutes as needed for chest pain.    [provider]  Nutritional Supplements (PROMOD) LIQD Take 60 mLs by mouth 2 (two) times daily. 05/07/18   [provider]  ondansetron (ZOFRAN) 4 MG tablet Take 4 mg by mouth every 6 (six) hours as needed for nausea or vomiting.    [provider]  polyethylene glycol (MIRALAX / GLYCOLAX) packet Take 17 g by mouth daily.     [provider]  sevelamer (RENAGEL) 800 MG tablet Take 1,600 mg by mouth 3 (three) times daily with meals.  09/03/18   [provider]  traMADol (ULTRAM) 50 MG tablet Take 1 tablet (50 mg total) by mouth daily. 10/29/19   Donne Hazel, MD   Current  Facility-Administered Medications  Medication Dose Route Frequency Provider Last Rate Last Admin  . 0.9 %  sodium chloride infusion   Intravenous Continuous End, Christopher, MD 10 mL/hr at 12/04/2019 1445 New Bag at 12/05/2019 1445  . 0.9 %  sodium chloride infusion  250 mL Intravenous PRN End, Harrell Gave, MD      . acetaminophen (TYLENOL) tablet 650 mg  650 mg Oral Q4H PRN End, Harrell Gave, MD      . amiodarone (NEXTERONE) 1.8 mg/mL load via infusion 150 mg  150 mg Intravenous Once Bowser, Laurel Dimmer, NP       Followed by  . amiodarone (NEXTERONE PREMIX) 360-4.14 MG/200ML-% (1.8 mg/mL) IV infusion  60 mg/hr Intravenous Continuous Bowser, Laurel Dimmer, NP       Followed by  . amiodarone (NEXTERONE PREMIX) 360-4.14 MG/200ML-% (1.8 mg/mL) IV infusion  30 mg/hr Intravenous Continuous Bowser, Laurel Dimmer, NP      . artificial tears (LACRILUBE) ophthalmic ointment 1 application  1 application Both Eyes G9M End, Christopher, MD      . aspirin chewable tablet 324 mg  324 mg Per NG tube Once End, Harrell Gave, MD      . Derrill Memo ON 12/12/2019] aspirin chewable tablet 81 mg  81 mg Per NG tube Daily End, Harrell Gave, MD      . atorvastatin (LIPITOR) tablet 10 mg  10 mg Oral QPM End, Christopher, MD      .  chlorhexidine gluconate (MEDLINE KIT) (PERIDEX) 0.12 % solution 15 mL  15 mL Mouth Rinse BID End, Harrell Gave, MD      . Derrill Memo ON 12/12/2019] Chlorhexidine Gluconate Cloth 2 % PADS 6 each  6 each Topical Q0600 End, Christopher, MD      . cisatracurium (NIMBEX) bolus via infusion 10.1 mg  0.1 mg/kg Intravenous Once End, Christopher, MD       And  . cisatracurium (NIMBEX) 200 mg in sodium chloride 0.9 % 200 mL (1 mg/mL) infusion  1-1.5 mcg/kg/min Intravenous Continuous End, Christopher, MD 9.09 mL/hr at 12/23/2019 1600 1.5 mcg/kg/min at 12/17/2019 1600  . clopidogrel (PLAVIX) tablet 300 mg  300 mg Per NG tube Once End, Harrell Gave, MD      . Derrill Memo ON 12/12/2019] clopidogrel (PLAVIX) tablet 75 mg  75 mg Oral QPM End, Christopher, MD      . fentaNYL (SUBLIMAZE) bolus via infusion 25 mcg  25 mcg Intravenous Q30 min PRN End, Harrell Gave, MD      . fentaNYL 2552mg in NS 2547m(1064mml) infusion-PREMIX  100-300 mcg/hr Intravenous Continuous End, Christopher, MD 5 mL/hr at 12/06/2019 1445 50 mcg/hr at 11/29/2019 1445  . heparin injection 5,000 Units  5,000 Units Subcutaneous Q8H Bowser, Grace E, NP      . hydrALAZINE (APRESOLINE) injection 10 mg  10 mg Intravenous Q20 Min PRN End, Christopher, MD   10 mg at 12/14/2019 1623  . insulin aspart (novoLOG) injection 3-9 Units  3-9 Units Subcutaneous Q4H End, Christopher, MD      . MEDLINE mouth rinse  15 mL Mouth Rinse 10 times per day End, Christopher, MD   15 mL at 11/30/2019 1553  . midazolam (VERSED) 2 MG/2ML injection           . norepinephrine (LEVOPHED) 4mg64m 250mL80mmix infusion  0-50 mcg/min Intravenous Titrated End, Christopher, MD 0 mL/hr at 11/25/2019 1519 0 mcg/min at 11/25/2019 1519  . ondansetron (ZOFRAN) injection 4 mg  4 mg Intravenous Q6H PRN End, ChrisHarrell Gave     . pantoprazole (PROTONIX) injection  40 mg  40 mg Intravenous QHS End, Christopher, MD      . propofol (DIPRIVAN) 1000 MG/100ML infusion  25-80 mcg/kg/min Intravenous Continuous End,  Christopher, MD 18.18 mL/hr at 11/27/2019 1600 30 mcg/kg/min at 12/04/2019 1600  . sodium chloride flush (NS) 0.9 % injection 3 mL  3 mL Intravenous Q12H End, Christopher, MD   3 mL at 12/14/2019 1553  . sodium chloride flush (NS) 0.9 % injection 3 mL  3 mL Intravenous PRN End, Harrell Gave, MD       Labs: Basic Metabolic Panel: Recent Labs  Lab 12/05/2019 1241 12/21/2019 1317 11/26/2019 1322  NA 138 136 135  K 3.7 3.6 3.6  CL 94* 95*  --   CO2 26  --   --   GLUCOSE 181* 148*  --   BUN 20 23  --   CREATININE 6.54* 6.00*  --   CALCIUM 9.2  --   --    CBC: Recent Labs  Lab 11/30/2019 1241 12/12/2019 1317 12/15/2019 1322  WBC 10.1  --   --   HGB 10.3* 10.2* 9.9*  HCT 32.6* 30.0* 29.0*  MCV 102.5*  --   --   PLT 271  --   --    CBG: Recent Labs  Lab 11/27/2019 1233  GLUCAP 110*   Studies/Results: DG Chest Portable 1 View  Result Date: 12/01/2019 CLINICAL DATA:  Patient intubated.  COVID-19 positive. EXAM: PORTABLE CHEST 1 VIEW COMPARISON:  10/25/2019 FINDINGS: Endotracheal tube tip lies 1 cm above the carina. Nasal/orogastric tube passes below the diaphragm, into the stomach and below the included field of view. Lungs demonstrate prominent bronchovascular markings. Possible subtle right upper and lower lung zone infiltrate. No pleural effusion or pneumothorax. Cardiac silhouette is borderline enlarged. IMPRESSION: 1. Well-positioned endotracheal and nasogastric tubes. 2. Subtle hazy infiltrate suggested in the right lung. No evidence of pulmonary edema. Electronically Signed   By: Lajean Manes M.D.   On: 12/23/2019 12:53    ROS: unable to obtain ROS due to patient being sedated.   Physical Exam: Vitals:   12/14/2019 1530 12/10/2019 1540 12/23/2019 1545 12/02/2019 1600  BP: 126/67 (!) 149/39 (!) 124/59 127/60  Pulse:  90    Resp: _0 Temp: (!) 96.1 F (35.6 C)   (!) 95.4 F (35.2 C)  TempSrc: Esophageal   Esophageal  SpO2:  100%    Weight:      Height:         General: WDWN,  chronically ill appearing female, intubated and sedated  Head: NCAT  Neck: Supple. No lymphadenopathy Lungs: CTA bilaterally. No wheeze, rales or rhonchi. Intubated Heart: RRR.  Abdomen: soft, nontender, +BS, no guarding, no rebound tenderness Lower extremities:no edema, chronic vascular changes on feet, b/l TMA Neuro: sedated Psych:  Responds to questions appropriately with a normal affect. Dialysis Access: LU AVF +b/t  Dialysis Orders:  MWF - [place]  4hrs, BFR 400, DFR 800,  EDW 94kg, 2K/ 2.25Ca  Access: LU AVF  Heparin 9000 Mircera 100 mcg q2wks - last 12/16 Hectorol 36mg IV qHD   Parsabiv [id number]   Assessment/Plan: 1.  Cardiac arrest - s/p CPR ~15, CODE STEMI called for EKG w/anterior lateral & inferior ST elevation. s/p emergent cath showing severe 2 vessel CAD w/ CTO of ostial RCA & subacute or chronic total occlusion of mid LAD - PTCA of mid LAD & D2 w/ reestablishment of TIMI-3 flow, stent not attempted due to small vessel size and heavy calcification.  Per cardiology 2. Acute respiratory failure - intubated. Per primary 3. Recent COVID admission w/PNA - tested COVID negative x2 and returned to home dialysis unit in early December. 4.  ESRD -  On HD MWF.  Last HD yesterday.  Plan for HD tomorrow at bedside.   5.  Hypertension/volume  - BP well controlled.  Does not appear volume overloaded on exam.  Met EDW yesterday.  Plan for HD with minimal UF tomorrow.  6.  Anemia of CKD - Hgb 9.9. ESA dosed on 12/16. 7.  Secondary Hyperparathyroidism -  Will check labs prior to HD. Continue VDRA.  Holding binders (Renvela 2AC TID) while patient is NPO. No parsabiv on [facility] formulary.  8.  Nutrition - NPO 9. Combined chronic HF - EF 20-25%, per cardiology  Jen Mow, PA-C [facility] Pager: [phone] 12/17/2019, 4:29 PM

## 2019-12-12 ENCOUNTER — Inpatient Hospital Stay (HOSPITAL_COMMUNITY): Payer: Medicare Other

## 2019-12-12 DIAGNOSIS — J9600 Acute respiratory failure, unspecified whether with hypoxia or hypercapnia: Secondary | ICD-10-CM

## 2019-12-12 DIAGNOSIS — R570 Cardiogenic shock: Secondary | ICD-10-CM

## 2019-12-12 DIAGNOSIS — I469 Cardiac arrest, cause unspecified: Secondary | ICD-10-CM

## 2019-12-12 DIAGNOSIS — Z992 Dependence on renal dialysis: Secondary | ICD-10-CM

## 2019-12-12 DIAGNOSIS — N186 End stage renal disease: Secondary | ICD-10-CM

## 2019-12-12 LAB — PROTIME-INR
INR: 1.2 (ref 0.8–1.2)
Prothrombin Time: 15 seconds (ref 11.4–15.2)

## 2019-12-12 LAB — APTT: aPTT: 41 seconds — ABNORMAL HIGH (ref 24–36)

## 2019-12-12 LAB — BASIC METABOLIC PANEL
Anion gap: 13 (ref 5–15)
Anion gap: 14 (ref 5–15)
Anion gap: 13 (ref 5–15)
BUN: 26 mg/dL — ABNORMAL HIGH (ref 8–23)
BUN: 27 mg/dL — ABNORMAL HIGH (ref 8–23)
BUN: 29 mg/dL — ABNORMAL HIGH (ref 8–23)
CO2: 26 mmol/L (ref 22–32)
CO2: 26 mmol/L (ref 22–32)
CO2: 26 mmol/L (ref 22–32)
Calcium: 7.7 mg/dL — ABNORMAL LOW (ref 8.9–10.3)
Calcium: 7.9 mg/dL — ABNORMAL LOW (ref 8.9–10.3)
Calcium: 7.7 mg/dL — ABNORMAL LOW (ref 8.9–10.3)
Chloride: 96 mmol/L — ABNORMAL LOW (ref 98–111)
Chloride: 95 mmol/L — ABNORMAL LOW (ref 98–111)
Chloride: 96 mmol/L — ABNORMAL LOW (ref 98–111)
Creatinine, Ser: 6.36 mg/dL — ABNORMAL HIGH (ref 0.44–1.00)
Creatinine, Ser: 7.15 mg/dL — ABNORMAL HIGH (ref 0.44–1.00)
Creatinine, Ser: 7.04 mg/dL — ABNORMAL HIGH (ref 0.44–1.00)
GFR calc Af Amer: 7 mL/min — ABNORMAL LOW (ref >60)
GFR calc Af Amer: 6 mL/min — ABNORMAL LOW (ref >60)
GFR calc Af Amer: 7 mL/min — ABNORMAL LOW (ref >60)
GFR calc non Af Amer: 6 mL/min — ABNORMAL LOW (ref >60)
GFR calc non Af Amer: 6 mL/min — ABNORMAL LOW (ref >60)
GFR calc non Af Amer: 6 mL/min — ABNORMAL LOW (ref >60)
Glucose, Bld: 137 mg/dL — ABNORMAL HIGH (ref 70–99)
Glucose, Bld: 140 mg/dL — ABNORMAL HIGH (ref 70–99)
Glucose, Bld: 125 mg/dL — ABNORMAL HIGH (ref 70–99)
Potassium: 3.8 mmol/L (ref 3.5–5.1)
Potassium: 4.2 mmol/L (ref 3.5–5.1)
Potassium: 4.8 mmol/L (ref 3.5–5.1)
Sodium: 135 mmol/L (ref 135–145)
Sodium: 135 mmol/L (ref 135–145)
Sodium: 135 mmol/L (ref 135–145)

## 2019-12-12 LAB — GLUCOSE, CAPILLARY
Glucose-Capillary: 121 mg/dL — ABNORMAL HIGH (ref 70–99)
Glucose-Capillary: 122 mg/dL — ABNORMAL HIGH (ref 70–99)
Glucose-Capillary: 126 mg/dL — ABNORMAL HIGH (ref 70–99)
Glucose-Capillary: 139 mg/dL — ABNORMAL HIGH (ref 70–99)
Glucose-Capillary: 140 mg/dL — ABNORMAL HIGH (ref 70–99)
Glucose-Capillary: 141 mg/dL — ABNORMAL HIGH (ref 70–99)
Glucose-Capillary: 142 mg/dL — ABNORMAL HIGH (ref 70–99)
Glucose-Capillary: 142 mg/dL — ABNORMAL HIGH (ref 70–99)
Glucose-Capillary: 143 mg/dL — ABNORMAL HIGH (ref 70–99)
Glucose-Capillary: 164 mg/dL — ABNORMAL HIGH (ref 70–99)
Glucose-Capillary: 87 mg/dL (ref 70–99)

## 2019-12-12 LAB — CBC
HCT: 29.6 % — ABNORMAL LOW (ref 36.0–46.0)
Hemoglobin: 9.8 g/dL — ABNORMAL LOW (ref 12.0–15.0)
MCH: 32 pg (ref 26.0–34.0)
MCHC: 33.1 g/dL (ref 30.0–36.0)
MCV: 96.7 fL (ref 80.0–100.0)
Platelets: 198 10*3/uL (ref 150–400)
RBC: 3.06 MIL/uL — ABNORMAL LOW (ref 3.87–5.11)
RDW: 18.1 % — ABNORMAL HIGH (ref 11.5–15.5)
WBC: 6.1 10*3/uL (ref 4.0–10.5)
nRBC: 0.3 % — ABNORMAL HIGH (ref 0.0–0.2)

## 2019-12-12 LAB — POCT I-STAT 7, (LYTES, BLD GAS, ICA,H+H)
Acid-Base Excess: 7 mmol/L — ABNORMAL HIGH (ref 0.0–2.0)
Bicarbonate: 28.8 mmol/L — ABNORMAL HIGH (ref 20.0–28.0)
Calcium, Ion: 0.93 mmol/L — ABNORMAL LOW (ref 1.15–1.40)
HCT: 31 % — ABNORMAL LOW (ref 36.0–46.0)
Hemoglobin: 10.5 g/dL — ABNORMAL LOW (ref 12.0–15.0)
O2 Saturation: 99 %
Patient temperature: 33
Potassium: 3.8 mmol/L (ref 3.5–5.1)
Sodium: 135 mmol/L (ref 135–145)
TCO2: 30 mmol/L (ref 22–32)
pCO2 arterial: 25.5 mmHg — ABNORMAL LOW (ref 32.0–48.0)
pH, Arterial: 7.65 (ref 7.350–7.450)
pO2, Arterial: 102 mmHg (ref 83.0–108.0)

## 2019-12-12 LAB — ECHOCARDIOGRAM COMPLETE
Height: 65 in
Weight: 3562.63 oz

## 2019-12-12 LAB — MAGNESIUM: Magnesium: 2.4 mg/dL (ref 1.7–2.4)

## 2019-12-12 LAB — POCT I-STAT, CHEM 8
BUN: 29 mg/dL — ABNORMAL HIGH (ref 8–23)
Calcium, Ion: 0.9 mmol/L — ABNORMAL LOW (ref 1.15–1.40)
Chloride: 100 mmol/L (ref 98–111)
Creatinine, Ser: 7.2 mg/dL — ABNORMAL HIGH (ref 0.44–1.00)
Glucose, Bld: 109 mg/dL — ABNORMAL HIGH (ref 70–99)
HCT: 32 % — ABNORMAL LOW (ref 36.0–46.0)
Hemoglobin: 10.9 g/dL — ABNORMAL LOW (ref 12.0–15.0)
Potassium: 4.5 mmol/L (ref 3.5–5.1)
Sodium: 136 mmol/L (ref 135–145)
TCO2: 27 mmol/L (ref 22–32)

## 2019-12-12 LAB — POCT ACTIVATED CLOTTING TIME
Activated Clotting Time: 202 seconds
Activated Clotting Time: 158 seconds

## 2019-12-12 LAB — TROPONIN I (HIGH SENSITIVITY)
Troponin I (High Sensitivity): 71 ng/L — ABNORMAL HIGH (ref <18)
Troponin I (High Sensitivity): 54 ng/L — ABNORMAL HIGH (ref <18)

## 2019-12-12 LAB — PHOSPHORUS: Phosphorus: 2.3 mg/dL — ABNORMAL LOW (ref 2.5–4.6)

## 2019-12-12 MED ORDER — ATROPINE SULFATE 1 MG/10ML IJ SOSY
PREFILLED_SYRINGE | INTRAMUSCULAR | Status: AC
  Filled 2019-12-12: qty 10

## 2019-12-12 MED ORDER — ZINC OXIDE 40 % EX OINT
TOPICAL_OINTMENT | Freq: Two times a day (BID) | CUTANEOUS | Status: DC
  Administered 2019-12-15 – 2019-12-21 (×4): 1 via TOPICAL
  Filled 2019-12-12 (×2): qty 57

## 2019-12-12 MED ORDER — HEPARIN SODIUM (PORCINE) 1000 UNIT/ML IJ SOLN
1000.0000 [IU] | Freq: Once | INTRAMUSCULAR | Status: AC
  Administered 2019-12-12: 2800 [IU] via INTRAVENOUS
  Filled 2019-12-12: qty 3

## 2019-12-12 MED ORDER — SODIUM CHLORIDE 0.9 % IV BOLUS
500.0000 mL | Freq: Once | INTRAVENOUS | Status: AC
  Administered 2019-12-12: 500 mL via INTRAVENOUS

## 2019-12-12 MED ORDER — SODIUM CHLORIDE 0.9% FLUSH
10.0000 mL | Freq: Two times a day (BID) | INTRAVENOUS | Status: DC
  Administered 2019-12-12 – 2019-12-16 (×10): 10 mL
  Administered 2019-12-17: 20 mL
  Administered 2019-12-17: 30 mL
  Administered 2019-12-18: 10 mL
  Administered 2019-12-18: 30 mL
  Administered 2019-12-19 – 2019-12-23 (×7): 10 mL

## 2019-12-12 MED ORDER — CALCIUM GLUCONATE-NACL 1-0.675 GM/50ML-% IV SOLN
1.0000 g | Freq: Once | INTRAVENOUS | Status: AC
  Administered 2019-12-12: 1000 mg via INTRAVENOUS
  Filled 2019-12-12: qty 50

## 2019-12-12 MED ORDER — SODIUM CHLORIDE 0.9% FLUSH: 10.0000 mL | INTRAVENOUS | Status: DC | PRN

## 2019-12-12 MED ORDER — PERFLUTREN LIPID MICROSPHERE
1.0000 mL | INTRAVENOUS | Status: AC | PRN
  Administered 2019-12-12: 2 mL via INTRAVENOUS
  Filled 2019-12-12: qty 10

## 2019-12-12 MED ORDER — NOREPINEPHRINE 16 MG/250ML-% IV SOLN
0.0000 ug/min | INTRAVENOUS | Status: DC
  Administered 2019-12-12: 3 ug/min via INTRAVENOUS
  Administered 2019-12-12: 16 ug/min via INTRAVENOUS
  Administered 2019-12-13: 24 ug/min via INTRAVENOUS
  Administered 2019-12-14: 16 ug/min via INTRAVENOUS
  Administered 2019-12-14: 20 ug/min via INTRAVENOUS
  Administered 2019-12-15: 8 ug/min via INTRAVENOUS
  Filled 2019-12-12 (×8): qty 250

## 2019-12-12 MED ORDER — PRO-STAT SUGAR FREE PO LIQD
30.0000 mL | Freq: Two times a day (BID) | ORAL | Status: DC
  Administered 2019-12-12 – 2019-12-23 (×23): 30 mL
  Filled 2019-12-12 (×23): qty 30

## 2019-12-12 MED ORDER — VITAL HIGH PROTEIN PO LIQD
1000.0000 mL | ORAL | Status: DC
  Administered 2019-12-12 – 2019-12-22 (×12): 1000 mL

## 2019-12-12 MED FILL — Nitroglycerin IV Soln 100 MCG/ML in D5W: INTRA_ARTERIAL | Qty: 10 | Status: AC

## 2019-12-12 MED FILL — Lidocaine HCl Local Preservative Free (PF) Inj 1%: INTRAMUSCULAR | Qty: 30 | Status: AC

## 2019-12-12 NOTE — Progress Notes (Signed)
Patient's son [name] will be coming to the hospital to interpret for patient once we are able to start weaning sedation down, per Georgann Housekeeper NP and Kristeen Miss.

## 2019-12-12 NOTE — Progress Notes (Signed)
Patient started rewarming at 1650. [place] parameters verified with Media planner as witness.

## 2019-12-12 NOTE — Progress Notes (Signed)
[facility] Progress Note Patient Name: [name] DOB: 1956-02-23 MRN: [id number]   Date of Service  12/12/2019  HPI/Events of Note  ABG on 40%/PRVC 18/TV 450/P 5 = 7.65/25.5/102.0  eICU Interventions  Will order: 1. Decrease PRVC rate to 12. 2. Repeat ABG at 7:30 AM.     Intervention Category Major Interventions: Acid-Base disturbance - evaluation and management;Respiratory failure - evaluation and management  Luqman Perrelli Cornelia Copa 12/12/2019, 5:34 AM

## 2019-12-12 NOTE — Progress Notes (Signed)
NAME:  [name], MRN:  [id number], DOB:  March 14, 1956, LOS: 1 ADMISSION DATE:  12/18/2019, CONSULTATION DATE: 12/20/2019 REFERRING MD: Dr. Saunders Revel, Cardiology, CHIEF COMPLAINT: Cardiac arrest, acute respiratory failure  Brief History   30, DM, CAD with CM, COVID-19 pneumonia early November.  Admitted 12/17 from SNF after acute arrest, presumed ventricular arrhythmia.  CPR 15 minutes. Partially successful PTCI LAD 12/17, no stent.  To ICU for TTM 33.   Past Medical History   has a past medical history of Anemia, Anemia in CKD (chronic kidney disease) (12/14/2014), Benign hypertension with ESRD (end-stage renal disease) ([place]) (02/12/2018), Bilateral leg edema, Blind, Cardiac arrest ([place]) (12/12/2014), CHF (congestive heart failure) ([place]), Coronary artery disease, Deaf, Diabetes mellitus, ESRD on hemodialysis ([place]), Essential hypertension (08/09/2009), Fibroid uterus, Heart murmur, Hyperlipidemia, Hypertension, Leiomyoma of uterus (08/09/2009), MI (myocardial infarction) ([place]) (11/2014), Moderate tricuspid regurgitation, Osteomyelitis ([place]), Psychiatric disorder, and Pulmonary hypertension ([place]).   Significant Hospital Events   Cardiac arrest 12/17 Cardiac catheterization 12/17  Consults:  Cardiology Nephrology  Procedures:  Left heart catheterization 12/17 >>   Significant Diagnostic Tests:   EEG 12/17 > profound severe encephalopathy, non specific to etiology. No seizures or epileptiform discharges  TTE 12/17 >>   Micro Data:    Antimicrobials:    Interim history/subjective:  No acute events overnight  Objective   Blood pressure (!) 118/56, pulse (!) 45, temperature (!) 91.2 F (32.9 C), temperature source Esophageal, resp. rate 12, height 5\' 5"  (1.651 m), weight 101 kg, SpO2 98 %. CVP:  [3 mmHg-17 mmHg] 17 mmHg  Vent Mode: PRVC FiO2 (%):  [30 %-100 %] 30 % Set Rate:  [12 bmp-18 bmp] 12 bmp Vt Set:  [420 mL-500 mL] 450 mL PEEP:  [5 cmH20] 5 cmH20 Plateau Pressure:  [18  cmH20-22 cmH20] 20 cmH20   Intake/Output Summary (Last 24 hours) at 12/12/2019 0857 Last data filed at 12/12/2019 0800 Gross per 24 hour  Intake 1183.37 ml  Output --  Net 1183.37 ml   Filed Weights   12/07/2019 1441 12/04/2019 1445  Weight: 101 kg 101 kg    Examination:  General: obese female on vent HENT: ET tube in place.  [place]/AT Lungs: Clear bilaterally, ventilated breath sounds Cardiovascular: bradycardic, regular,  no MRG Abdomen: Obese, nondistended, hypoactive bowel sounds Extremities: Bilateral metatarsal amputations Skin: Unstaged sacral pressure ulcer noted and dressed Neuro: Unresponsive, sedated. Deaf, partially blind at baseline. PERRL GU: Foley catheter  Resolved Hospital Problem list     Assessment & Plan:   Acute cardiac arrest, presumed VT/VF given her history of CAD and prior ventricular arrhythmias, shockable initial rhythm with AED. - TTM 33C - Sedation, paralytics, serial labs as per hypothermia protocol - Continue home amiodarone  Coronary artery disease, acute STEMI -Post cardiac catheterization with attempted PTCI to LAD, only partially successful, no stent placed -Anticoagulation and ASA, antiplatelets post cardiac catheterization.  Appreciate cardiology management -echo pending  Acute respiratory failure with hypoxemia due to cardiac arrest - Plan for ventilator management with PRVC 8 cc/kg, wean PEEP and FiO2 - No SBT until rewarmed and sedation weaned.  - VAP prevention order set  Shock, likely cardiogenic with possible component of sedating medications - Plan for CVC placement, transduce CVP to guide volume management - On low-dose norepinephrine, wean as able  End-stage renal disease, chronic dialysis - Nephrology following. Would need CRRT at this point, but access is an issue.   Anion gap metabolic acidosis improved - lactic cleared - follow BMP  History of  hypertension -Home antihypertensive medications on hold  Diabetes  mellitus -ICU hyperglycemia protocol  Acute encephalopathy, consider toxic metabolic.  Also at risk for possible hypoxemic injury. - TTM as above - Assess neurological status after rewarming. - EEG ongoing  Chronic sacral pressure ulcer present at the time of admission -WOC consult and assistance  Best practice:  Diet: TF Pain/Anxiety/Delirium protocol (if indicated): Versed, fentanyl VAP protocol (if indicated): Ordered 12/17 DVT prophylaxis: Systemic anticoagulation post catheterization GI prophylaxis: PPI Glucose control: ICU Hg protocol Mobility: BR Code Status: Full Family Communication: Son updated.  Disposition: cath lab to ICU  Labs   CBC: Recent Labs  Lab 12/16/2019 1241 12/23/2019 1322 12/18/2019 1546 12/20/2019 1800 12/12/19 0107 12/12/19 0431  WBC 10.1  --   --   --  6.1  --   HGB 10.3* 9.9* 11.6* 11.2* 9.8* 10.5*  HCT 32.6* 29.0* 34.0* 33.0* 29.6* 31.0*  MCV 102.5*  --   --   --  96.7  --   PLT 271  --   --   --  198  --     Basic Metabolic Panel: Recent Labs  Lab 12/23/2019 1644 12/06/2019 1800 12/05/2019 1830 11/26/2019 1945 11/27/2019 2145 12/12/19 0107 12/12/19 0431  NA 134* 135 133* 133* 134* 135 135  K 4.1 3.6 3.9 4.2 3.9 3.8 3.8  CL 93* 94* 93* 93* 95* 96*  --   CO2 26  --  23 24 25 26   --   GLUCOSE 180* 213* 201* 179* 154* 137*  --   BUN 23 23 25* 24* 24* 26*  --   CREATININE 6.55* 6.20* 6.52* 6.65* 6.62* 6.36*  --   CALCIUM 8.1*  --  7.8* 7.5* 7.6* 7.7*  --   MG  --   --   --   --   --  2.4  --   PHOS  --   --   --   --   --  2.3*  --    GFR: Estimated Creatinine Clearance: 10.7 mL/min (A) (by C-G formula based on SCr of 6.36 mg/dL (H)). Recent Labs  Lab 12/03/2019 1241 12/12/19 0107  WBC 10.1 6.1    Liver Function Tests: No results for input(s): AST, ALT, ALKPHOS, BILITOT, PROT, ALBUMIN in the last 168 hours. No results for input(s): LIPASE, AMYLASE in the last 168 hours. No results for input(s): AMMONIA in the last 168 hours.  ABG     Component Value Date/Time   PHART 7.650 (HH) 12/12/2019 0431   PCO2ART 25.5 (L) 12/12/2019 0431   PO2ART 102.0 12/12/2019 0431   HCO3 28.8 (H) 12/12/2019 0431   TCO2 30 12/12/2019 0431   ACIDBASEDEF 7.3 (H) 12/26/2013 0928   O2SAT 99.0 12/12/2019 0431     Coagulation Profile: Recent Labs  Lab 12/21/2019 1644 12/12/19 0058  INR 1.5* 1.2    Cardiac Enzymes: No results for input(s): CKTOTAL, CKMB, CKMBINDEX, TROPONINI in the last 168 hours.  HbA1C: Hemoglobin A1C  Date/Time Value Ref Range Status  04/23/2018 12:00 AM 5.5  Final  12/11/2017 12:00 AM 5.4  Final   Hgb A1c MFr Bld  Date/Time Value Ref Range Status  10/26/2019 10:09 AM 5.4 4.8 - 5.6 % Final    Comment:    (NOTE) Pre diabetes:          5.7%-6.4% Diabetes:              >6.4% Glycemic control for   <7.0% adults with diabetes     CBG:  Recent Labs  Lab 12/12/19 0056 12/12/19 0241 12/12/19 0427 12/12/19 0643 12/12/19 0800  GLUCAP 141* 142* 142* 139* 143*    Review of Systems:   Unable to obtain  Past Medical History  She,  has a past medical history of Anemia, Anemia in CKD (chronic kidney disease) (12/14/2014), Benign hypertension with ESRD (end-stage renal disease) ([place]) (02/12/2018), Bilateral leg edema, Blind, Cardiac arrest ([place]) (12/12/2014), CHF (congestive heart failure) ([place]), Coronary artery disease, Deaf, Diabetes mellitus, ESRD on hemodialysis ([place]), Essential hypertension (08/09/2009), Fibroid uterus, Heart murmur, Hyperlipidemia, Hypertension, Leiomyoma of uterus (08/09/2009), MI (myocardial infarction) ([place]) (11/2014), Moderate tricuspid regurgitation, Osteomyelitis ([place]), Psychiatric disorder, and Pulmonary hypertension ([place]).   Surgical History    Past Surgical History:  Procedure Laterality Date  . AMPUTATION Bilateral 04/02/2015   Procedure: Amputation Bilateral Great Toes MTP Joint;  Surgeon: Newt Minion, MD;  Location: [facility];  Service: Orthopedics;  Laterality: Bilateral;  .  AMPUTATION Left 05/14/2015   Procedure: Left Transmetatarsal Amputation;  Surgeon: Newt Minion, MD;  Location: [facility];  Service: Orthopedics;  Laterality: Left;  . AMPUTATION Right 08/20/2015   Procedure: Transmetatarsal Amputation Right Foot;  Surgeon: Newt Minion, MD;  Location: [facility];  Service: Orthopedics;  Laterality: Right;  . AV FISTULA PLACEMENT Left 01/02/2014   Procedure: ARTERIOVENOUS (AV) FISTULA CREATION;  Surgeon: Rosetta Posner, MD;  Location: [facility];  Service: Vascular;  Laterality: Left;  . CORONARY/GRAFT ACUTE MI REVASCULARIZATION N/A 12/05/2019   Procedure: CORONARY/GRAFT ACUTE MI REVASCULARIZATION;  Surgeon: [name], MD;  Location: [facility];  Service: Cardiovascular;  Laterality: N/A;  . INSERTION OF DIALYSIS CATHETER Right 01/02/2014   Procedure: INSERTION OF DIALYSIS CATHETER;  Surgeon: Rosetta Posner, MD;  Location: [facility];  Service: Vascular;  Laterality: Right;  . LEFT HEART CATH AND CORONARY ANGIOGRAPHY N/A 12/01/2019   Procedure: LEFT HEART CATH AND CORONARY ANGIOGRAPHY;  Surgeon: [name], MD;  Location: [facility];  Service: Cardiovascular;  Laterality: N/A;  . NO PAST SURGERIES    . ORIF PATELLA Left 05/17/2015   Procedure: OPEN REDUCTION INTERNAL (ORIF) FIXATION PATELLA;  Surgeon: Newt Minion, MD;  Location: [facility];  Service: Orthopedics;  Laterality: Left;     Social History   reports that she has never smoked. She has never used smokeless tobacco. She reports that she does not drink alcohol or use drugs.   Family History   Her family history includes Heart disease in an other family member; Other in her father.   Allergies No Known Allergies   Home Medications  Prior to Admission medications   Medication Sig Start Date End Date Taking? Authorizing Provider  albuterol (VENTOLIN HFA) 108 (90 Base) MCG/ACT inhaler Inhale 2 puffs into the lungs every 6 (six) hours. 10/29/19   Donne Hazel, MD  amiodarone (PACERONE) 200 MG tablet  Take 200 mg by mouth every evening.     [provider]  amLODipine (NORVASC) 5 MG tablet Take 5 mg by mouth daily.    [provider]  ARIPiprazole (ABILIFY) 15 MG tablet Take 15 mg by mouth daily.  05/17/18   [provider]  atorvastatin (LIPITOR) 10 MG tablet Take 10 mg by mouth every evening.    [provider]  azithromycin (ZITHROMAX) 250 MG tablet Take one tab PO daily x 2 more days, then stop. Zero refills 10/29/19   Donne Hazel, MD  Cholecalciferol (VITAMIN D3) 50 MCG (2000 UT) capsule Take 2,000 Units  by mouth daily.    [provider]  clopidogrel (PLAVIX) 75 MG tablet Take 75 mg by mouth every evening.    [provider]  Dextran 70-Hypromellose (ARTIFICIAL TEARS) 0.1-0.3 % SOLN Apply 1 drop to eye every 12 (twelve) hours as needed (dryness).     [provider]  folic acid-vitamin b complex-vitamin c-selenium-zinc (DIALYVITE) 3 MG TABS tablet Take 1 tablet by mouth daily.    [provider]  insulin lispro (ADMELOG) 100 UNIT/ML injection Inject 0-5 Units into the skin See admin instructions. Per Sliding Scale twice dialy: 0-150 0units 151-400 5units    [provider]  metoprolol succinate (TOPROL-XL) 25 MG 24 hr tablet Take 1 tablet (25 mg total) by mouth daily. 10/30/19 11/29/19  Donne Hazel, MD  nitroGLYCERIN (NITROSTAT) 0.4 MG SL tablet Place 0.4 mg under the tongue every 5 (five) minutes as needed for chest pain.    [provider]  Nutritional Supplements (PROMOD) LIQD Take 60 mLs by mouth 2 (two) times daily. 05/07/18   [provider]  ondansetron (ZOFRAN) 4 MG tablet Take 4 mg by mouth every 6 (six) hours as needed for nausea or vomiting.    [provider]  polyethylene glycol (MIRALAX / GLYCOLAX) packet Take 17 g by mouth daily.     [provider]  sevelamer (RENAGEL) 800 MG tablet Take 1,600 mg by mouth 3 (three) times daily with meals.  09/03/18    [provider]  traMADol (ULTRAM) 50 MG tablet Take 1 tablet (50 mg total) by mouth daily. 10/29/19   Donne Hazel, MD     Critical care time: 45 mins       Georgann Housekeeper, AGACNP-BC [facility] for personal pager PCCM on call pager [phone]  12/12/2019 9:24 AM

## 2019-12-12 NOTE — Progress Notes (Signed)
[facility] Progress Note Patient Name: [name] DOB: Jul 21, 1956 MRN: [id number]   Date of Service  12/12/2019  HPI/Events of Note  K+ = 3.8, PO4--- = 2.3, Ca++ = 7.7 which corrects to 8.42 (low) given albumin = 3.1 and Creatinine = 6.36. Will not correct PO4--- given ESRD on HD.   eICU Interventions  Will replace Ca++.      Intervention Category Major Interventions: Electrolyte abnormality - evaluation and management  Quentin Strebel [place] 12/12/2019, 2:21 AM

## 2019-12-12 NOTE — Progress Notes (Signed)
Patient ID: [name], female   DOB: 11-23-1956, 63 y.o.   MRN: [id number] S: EEG with severe encephalopathy.  Also had issue with hypotension and lack of vascular access and required tlc placement in right femoral vein.  Remains unresponsive and hypotensive on levophed O:BP (!) 118/56 (BP Location: Right Arm)   Pulse (!) 45   Temp (!) 91.2 F (32.9 C) (Esophageal)   Resp 12   Ht 5' 5"  (1.651 m)   Wt 101 kg   SpO2 98%   BMI 37.05 kg/m   Intake/Output Summary (Last 24 hours) at 12/12/2019 0900 Last data filed at 12/12/2019 0800 Gross per 24 hour  Intake 1183.37 ml  Output --  Net 1183.37 ml   Intake/Output: I/O last 3 completed shifts: In: 1089.8 [I.V.:1043.1; IV Piggyback:46.7] Out: -   Intake/Output this shift:  Total I/O In: 93.6 [I.V.:93.6] Out: -  Weight change:  Gen: intubated and unresponsive CVS: no rub Resp: cta  Abd: obese, +BS, soft Ext: trace edema, LUE AVF +T/B  Recent Labs  Lab 12/04/2019 1241 12/02/2019 1546 12/06/2019 1644 12/16/2019 1800 11/29/2019 1830 12/04/2019 1945 11/30/2019 2145 12/12/19 0107 12/12/19 0431  NA 138 134* 134* 135 133* 133* 134* 135 135  K 3.7 4.1 4.1 3.6 3.9 4.2 3.9 3.8 3.8  CL 94* 94* 93* 94* 93* 93* 95* 96*  --   CO2 26  --  26  --  23 24 25 26   --   GLUCOSE 181* 206* 180* 213* 201* 179* 154* 137*  --   BUN 20 25* 23 23 25* 24* 24* 26*  --   CREATININE 6.54* 6.50* 6.55* 6.20* 6.52* 6.65* 6.62* 6.36*  --   CALCIUM 9.2  --  8.1*  --  7.8* 7.5* 7.6* 7.7*  --   PHOS  --   --   --   --   --   --   --  2.3*  --    Liver Function Tests: No results for input(s): AST, ALT, ALKPHOS, BILITOT, PROT, ALBUMIN in the last 168 hours. No results for input(s): LIPASE, AMYLASE in the last 168 hours. No results for input(s): AMMONIA in the last 168 hours. CBC: Recent Labs  Lab 12/04/2019 1241 11/27/2019 1800 12/12/19 0107 12/12/19 0431  WBC 10.1  --  6.1  --   HGB 10.3* 11.2* 9.8* 10.5*  HCT 32.6* 33.0* 29.6* 31.0*  MCV 102.5*  --  96.7  --    PLT 271  --  198  --    Cardiac Enzymes: No results for input(s): CKTOTAL, CKMB, CKMBINDEX, TROPONINI in the last 168 hours. CBG: Recent Labs  Lab 12/12/19 0056 12/12/19 0241 12/12/19 0427 12/12/19 0643 12/12/19 0800  GLUCAP 141* 142* 142* 139* 143*    Iron Studies: No results for input(s): IRON, TIBC, TRANSFERRIN, FERRITIN in the last 72 hours. Studies/Results: CARDIAC CATHETERIZATION  Result Date: 12/17/2019 Conclusions: 1. 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  The distal LAD is small and diffusely diseased. 2. Severely reduced left ventricular systolic function with mid and apical akinesis and aneurysm formation. 3. Mildly elevated left ventricular filling pressure. 4. PTCA of mid LAD and D2 with reestablishment of TIMI-3 flow and ~70% residual stenosis.  Given small vessel size and heavy calcification, stent placement was not attempted.  Recommendations: 1. Post-arrest care per critical care medicine, including therapeutic hypothermia. 2. Aspirin and clopidogrel for at least 12 months, ideally longer.  3. Amiodarone infusion x 24 hours; consider transitioning to enteric amiodarone thereafter when able. 4. Initiate evidence based HF therapy as blood pressure tolerates. 5. Wean norepinephrine infusion, as BP allows. 6. Consider EP consultation to reconsider ICD placement for secondary prevention, if the patient makes a meaningful neurologic and functional recovery. [name], MD [facility]   DG Chest Portable 1 View  Result Date: 12/19/2019 CLINICAL DATA:  Patient intubated.  COVID-19 positive. EXAM: PORTABLE CHEST 1 VIEW COMPARISON:  10/25/2019 FINDINGS: Endotracheal tube tip lies 1 cm above the carina. Nasal/orogastric tube passes below the diaphragm, into the stomach and below the included field of view. Lungs demonstrate prominent bronchovascular markings. Possible subtle right upper and  lower lung zone infiltrate. No pleural effusion or pneumothorax. Cardiac silhouette is borderline enlarged. IMPRESSION: 1. Well-positioned endotracheal and nasogastric tubes. 2. Subtle hazy infiltrate suggested in the right lung. No evidence of pulmonary edema. Electronically Signed   By: Lajean Manes M.D.   On: 12/21/2019 12:53   EEG adult  Result Date: 11/28/2019 Lora Havens, MD     12/18/2019  6:16 PM Patient Name: [name] MRN: [id number] Epilepsy Attending: Lora Havens Referring Physician/Provider: Dr Harrell Gave End Date: 12/17/2019 Duration: 21.39 mins Patient history: 63yo F s/p cardiac arrest on TTM. EEG to evaluate for seizure Level of alertness: comatose AEDs during EEG study: Propofol Technical aspects: This EEG study was done with scalp electrodes positioned according to the 10-20 International system of electrode placement. Electrical activity was acquired at a sampling rate of 500Hz  and reviewed with a high frequency filter of 70Hz  and a low frequency filter of 1Hz . EEG data were recorded continuously and digitally stored. DESCRIPTION: EEG showed generalized background suppression. EEG was not reactive to tactile stimulation. Hyperventilation and photic stimulation were not performed. ABNORMALITY - Background suppression, generalized IMPRESSION: This study is suggestive of profound severe encephalopathy, non specific to etiology. No seizures or epileptiform discharges were seen throughout the recording. Priyanka Barbra Sarks   Overnight EEG with video  Result Date: 12/12/2019 Lora Havens, MD     12/12/2019  8:45 AM Patient Name: [name] MRN: [id number] Epilepsy Attending: Lora Havens Referring Physician/Provider: Dr Harrell Gave End Duration: 12/70/2020 1759 to 12/12/2019 0845  Patient history: 63yo F s/p cardiac arrest on TTM. EEG to evaluate for seizure  Level of alertness: comatose  AEDs during EEG study: Propofol  Technical aspects: This EEG study was done  with scalp electrodes positioned according to the 10-20 International system of electrode placement. Electrical activity was acquired at a sampling rate of 500Hz  and reviewed with a high frequency filter of 70Hz  and a low frequency filter of 1Hz . EEG data were recorded continuously and digitally stored.  DESCRIPTION: EEG showed continuous generalized 2 to 5 Hz theta-delta slowing.  EEG was reactive to tactile stimulation. Hyperventilation and photic stimulation were not performed.  ABNORMALITY -continuous slow, generalized IMPRESSION: This study is suggestive of severe encephalopathy, non specific to etiology. No seizures or epileptiform discharges were seen throughout the recording. EEG appears to be improving throughout the study.  Priyanka Barbra Sarks   . artificial tears  1 application Both Eyes H6P  . aspirin  81 mg Per NG tube Daily  . atorvastatin  10 mg Oral QPM  . chlorhexidine gluconate (MEDLINE KIT)  15 mL Mouth Rinse BID  . Chlorhexidine Gluconate Cloth  6 each Topical Q0600  . cisatracurium  0.1 mg/kg Intravenous Once  . clopidogrel  75 mg Oral QPM  .  heparin injection (subcutaneous)  5,000 Units Subcutaneous Q8H  . insulin aspart  3-9 Units Subcutaneous Q4H  . mouth rinse  15 mL Mouth Rinse 10 times per day  . pantoprazole (PROTONIX) IV  40 mg Intravenous QHS  . sodium chloride flush  10-40 mL Intracatheter Q12H  . sodium chloride flush  3 mL Intravenous Q12H    BMET    Component Value Date/Time   NA 135 12/12/2019 0431   NA 138 04/23/2018 0000   K 3.8 12/12/2019 0431   CL 96 (L) 12/12/2019 0107   CO2 26 12/12/2019 0107   GLUCOSE 137 (H) 12/12/2019 0107   BUN 26 (H) 12/12/2019 0107   BUN 38 (A) 04/23/2018 0000   CREATININE 6.36 (H) 12/12/2019 0107   CREATININE 0.76 05/23/2011 0956   CALCIUM 7.7 (L) 12/12/2019 0107   GFRNONAA 6 (L) 12/12/2019 0107   GFRAA 7 (L) 12/12/2019 0107   CBC    Component Value Date/Time   WBC 6.1 12/12/2019 0107   RBC 3.06 (L) 12/12/2019 0107    HGB 10.5 (L) 12/12/2019 0431   HCT 31.0 (L) 12/12/2019 0431   HCT 21.3 (L) 05/01/2018 0223   PLT 198 12/12/2019 0107   MCV 96.7 12/12/2019 0107   MCH 32.0 12/12/2019 0107   MCHC 33.1 12/12/2019 0107   RDW 18.1 (H) 12/12/2019 0107   LYMPHSABS 0.8 10/29/2019 0345   MONOABS 0.7 10/29/2019 0345   EOSABS 0.0 10/29/2019 0345   BASOSABS 0.0 10/29/2019 0345   Background:  Per chart she had witnessed cardiac arrest while doing PT at the SNF and underwent CPR for ~74mn, including 1 shock. Code STEMI called and patient taken to cath lab emergently where she underwent emergent cath showing severe 2 vessel CAD w/ CTO of ostial RCA & subacute or chronic total occlusion of mid LAD - PTCA of mid LAD & D2 w/ reestablishment of TIMI-3 flow, stent not attempted due to small vessel size and heavy calcification.   Assessment/Plan:  1. STEMI/cardiac arrest- s vessel CAD with PCI with cardiogenic shock requiring pressors. 2. AMS- EEG with severe encephalopathy.  Neuro following. 3. Hypotension- requiring pressors 4. ESRD- medically too unstable for IHD at this time.  Discussed case with Drs. Al[place] and [place]  Will hold off on HD and likely need CRRT in the next 24 hours vs palliative care given poor prognosis. 5. Anemia of CKD stage 5.  Stable 6. Combined CHF, chronic 7. SHPTH 8. Vascular access- LUE AVF, right fem tlc will need temp HD cath for CVVHD tomorrow if remains on high dose pressors.  JDonetta Potts MD [employer]([phone]

## 2019-12-12 NOTE — Procedures (Addendum)
Patient Name: SHAWNEEN DEETZ  MRN: [id number]  Epilepsy Attending: Lora Havens  Referring Physician/Provider: Dr Harrell Gave End Duration: 11/27/2019 1759 to 12/12/2019 1759  Patient history: 63yo F s/p cardiac arrest on TTM. EEG to evaluate for seizure  Level of alertness: comatose  AEDs during EEG study: Propofol  Technical aspects: This EEG study was done with scalp electrodes positioned according to the 10-20 International system of electrode placement. Electrical activity was acquired at a sampling rate of 500Hz  and reviewed with a high frequency filter of 70Hz  and a low frequency filter of 1Hz . EEG data were recorded continuously and digitally stored.   DESCRIPTION: EEG showed continuous generalized 2 to 5 Hz theta-delta slowing.  EEG was reactive to tactile stimulation. Hyperventilation and photic stimulation were not performed.  ABNORMALITY -continuous slow, generalized  IMPRESSION: This study is suggestive of severe encephalopathy, non specific to etiology. No seizures or epileptiform discharges were seen throughout the recording.  EEG appears to be improving throughout the study.     Marline Morace Barbra Sarks

## 2019-12-12 NOTE — Progress Notes (Signed)
Right femoral arterial sheath removed at 1004. Manual pressure held for 20 minutes. Site level 0; unremarkable. No compilations. Able to doppler DP pulse before and after removal. VSS.

## 2019-12-12 NOTE — Consult Note (Signed)
[facility] Nurse wound follow up Patient receiving care in [place]. Wound type: consult ordered for sacral wound, however, NO sacral wound is present.  Only scattered areas of hypopigmentation, no open wound. I have removed the foam sacral dressing and ordered twice daily application of 24% Desitin.   Thank you for the consult.  Discussed plan of care with the bedside nurse.  [facility] nurse will not follow at this time.  Please re-consult the [facility] team if needed.  Val Riles, RN, MSN, CWOCN, CNS-BC, pager [phone]

## 2019-12-12 NOTE — Progress Notes (Signed)
IO d/c'd per MD order. Site unremarkable.

## 2019-12-12 NOTE — Procedures (Signed)
Trialysis Catheter Insertion Procedure Note HAYSLEE CASEBOLT [id number] 1956-11-23  Procedure: Insertion of Hemodialysis Catheter Indications: Renal failure  Procedure Details Consent: Risks of procedure as well as the alternatives and risks of each were explained to the (patient/caregiver).  Consent for procedure obtained. Time Out: Verified patient identification, verified procedure, site/side was marked, verified correct patient position, special equipment/implants available, medications/allergies/relevent history reviewed, required imaging and test results available.  Performed  Maximum sterile technique was used including antiseptics, cap, gloves, gown, hand hygiene, mask and sheet. Skin prep: Chlorhexidine; local anesthetic administered A antimicrobial bonded/coated triple lumen catheter was placed in the left internal jugular vein using the Seldinger technique. Catheter placed to 20 cm. Blood aspirated via all 3 ports and then flushed x 3. Line sutured x 2 and dressing applied.  Ultrasound guidance used.Yes.    Evaluation Blood flow good Complications: No apparent complications Patient did tolerate procedure well. Chest X-ray ordered to verify placement.  CXR: pending.   Georgann Housekeeper, AGACNP-BC [place]  See Amion for personal pager PCCM on call pager [phone]  12/12/2019 4:42 PM

## 2019-12-12 NOTE — Progress Notes (Signed)
  Echocardiogram 2D Echocardiogram has been performed.  [name] 12/12/2019, 9:47 AM

## 2019-12-12 NOTE — Progress Notes (Signed)
LTM continues.  Electrodes checked, reset as needed to obtain < 5 Ohms.. Checked for skin break down at [place], FP2, F8; none observed.

## 2019-12-12 NOTE — Progress Notes (Signed)
Initial Nutrition Assessment  DOCUMENTATION CODES:   Not applicable  INTERVENTION:   Once CRRT started- add b complex with Vitamin C to account for losses.   Tube feeding:  -Vital High Protein @ 20 ml/hr via OGT -Increase by 10 ml Q6 hours to goal rate of 45 ml/hr (1080 ml)  -30 ml Prostat BID  Provides: 1280  kcals, 125 grams protein, 903 ml free water.   NUTRITION DIAGNOSIS:   Increased nutrient needs related to acute illness as evidenced by estimated needs.  GOAL:   Patient will meet greater than or equal to 90% of their needs  MONITOR:   Diet advancement, Vent status, Skin, TF tolerance, Weight trends, Labs, I & O's  REASON FOR ASSESSMENT:   Ventilator    ASSESSMENT:   Patient with PMH significant for deafness, DM, CAD with CM, ESRD on HD, CHF, essential HTN, HLD, moderate tricuspid regurgitation, and recent COVID 19 PNA (early November). Presents this admission with acute cardiac arrest.  12/17- s/p cardiac cath (partially successful) no stent   On TTM 33. Requiring pressors. Needs CRRT but having trouble with access. Spoke with CCM okay to start feeding. Will titrate to goal.   Weight shows to increase from 98.5 kg on 11/4 to 101 kg this admission. Non-pitting facial edema noted. EDW at OP HD noted as 94 kg.   Patient is currently intubated on ventilator support MV: 5.3 L/min Temp (24hrs), Avg:91.9 F (33.3 C), Min:90 F (32.2 C), Max:96.1 F (35.6 C)   I/O: +1,673 ml since admit   Drips: nimbex, levophed, versed, fentanyl Medications: SS novolog  Labs: Cr 7.15 CBG 110-154  Diet Order:   Diet Order            Diet NPO time specified  Diet effective now              EDUCATION NEEDS:   Not appropriate for education at this time  Skin:  Skin Assessment: Skin Integrity Issues: Skin Integrity Issues:: Stage II Stage II: buttocks  Last BM:  PTA  Height:   Ht Readings from Last 1 Encounters:  12/23/2019 5\' 5"  (1.651 m)    Weight:   Wt  Readings from Last 1 Encounters:  11/27/2019 101 kg    Ideal Body Weight:  56.8 kg  BMI:  Body mass index is 37.05 kg/m.  Estimated Nutritional Needs:   Kcal:  1034-1316 kcal  Protein:  114- 142 grams  Fluid:  1000 ml + UOP   Mariana Single RD, LDN Clinical Nutrition Pager # [phone]

## 2019-12-13 DIAGNOSIS — E118 Type 2 diabetes mellitus with unspecified complications: Secondary | ICD-10-CM

## 2019-12-13 LAB — RENAL FUNCTION PANEL
Albumin: 2.9 g/dL — ABNORMAL LOW (ref 3.5–5.0)
Albumin: 2.8 g/dL — ABNORMAL LOW (ref 3.5–5.0)
Anion gap: 13 (ref 5–15)
Anion gap: 12 (ref 5–15)
BUN: 38 mg/dL — ABNORMAL HIGH (ref 8–23)
BUN: 34 mg/dL — ABNORMAL HIGH (ref 8–23)
CO2: 25 mmol/L (ref 22–32)
CO2: 23 mmol/L (ref 22–32)
Calcium: 7.3 mg/dL — ABNORMAL LOW (ref 8.9–10.3)
Calcium: 7.4 mg/dL — ABNORMAL LOW (ref 8.9–10.3)
Chloride: 95 mmol/L — ABNORMAL LOW (ref 98–111)
Chloride: 100 mmol/L (ref 98–111)
Creatinine, Ser: 7.44 mg/dL — ABNORMAL HIGH (ref 0.44–1.00)
Creatinine, Ser: 6.36 mg/dL — ABNORMAL HIGH (ref 0.44–1.00)
GFR calc Af Amer: 6 mL/min — ABNORMAL LOW (ref >60)
GFR calc Af Amer: 7 mL/min — ABNORMAL LOW (ref >60)
GFR calc non Af Amer: 5 mL/min — ABNORMAL LOW (ref >60)
GFR calc non Af Amer: 6 mL/min — ABNORMAL LOW (ref >60)
Glucose, Bld: 145 mg/dL — ABNORMAL HIGH (ref 70–99)
Glucose, Bld: 159 mg/dL — ABNORMAL HIGH (ref 70–99)
Phosphorus: 4.6 mg/dL (ref 2.5–4.6)
Phosphorus: 2.8 mg/dL (ref 2.5–4.6)
Potassium: 5.1 mmol/L (ref 3.5–5.1)
Potassium: 4.9 mmol/L (ref 3.5–5.1)
Sodium: 133 mmol/L — ABNORMAL LOW (ref 135–145)
Sodium: 135 mmol/L (ref 135–145)

## 2019-12-13 LAB — POCT I-STAT 7, (LYTES, BLD GAS, ICA,H+H)
Acid-Base Excess: 7 mmol/L — ABNORMAL HIGH (ref 0.0–2.0)
Acid-Base Excess: 2 mmol/L (ref 0.0–2.0)
Acid-Base Excess: 1 mmol/L (ref 0.0–2.0)
Acid-Base Excess: 2 mmol/L (ref 0.0–2.0)
Bicarbonate: 30.9 mmol/L — ABNORMAL HIGH (ref 20.0–28.0)
Bicarbonate: 29.1 mmol/L — ABNORMAL HIGH (ref 20.0–28.0)
Bicarbonate: 27.8 mmol/L (ref 20.0–28.0)
Bicarbonate: 28.5 mmol/L — ABNORMAL HIGH (ref 20.0–28.0)
Calcium, Ion: 0.99 mmol/L — ABNORMAL LOW (ref 1.15–1.40)
Calcium, Ion: 0.97 mmol/L — ABNORMAL LOW (ref 1.15–1.40)
Calcium, Ion: 0.97 mmol/L — ABNORMAL LOW (ref 1.15–1.40)
Calcium, Ion: 1.01 mmol/L — ABNORMAL LOW (ref 1.15–1.40)
HCT: 32 % — ABNORMAL LOW (ref 36.0–46.0)
HCT: 34 % — ABNORMAL LOW (ref 36.0–46.0)
HCT: 34 % — ABNORMAL LOW (ref 36.0–46.0)
HCT: 32 % — ABNORMAL LOW (ref 36.0–46.0)
Hemoglobin: 10.9 g/dL — ABNORMAL LOW (ref 12.0–15.0)
Hemoglobin: 11.6 g/dL — ABNORMAL LOW (ref 12.0–15.0)
Hemoglobin: 11.6 g/dL — ABNORMAL LOW (ref 12.0–15.0)
Hemoglobin: 10.9 g/dL — ABNORMAL LOW (ref 12.0–15.0)
O2 Saturation: 99 %
O2 Saturation: 96 %
O2 Saturation: 95 %
O2 Saturation: 96 %
Patient temperature: 32.9
Patient temperature: 35.7
Patient temperature: 35.3
Patient temperature: 36.8
Potassium: 4.2 mmol/L (ref 3.5–5.1)
Potassium: 5.2 mmol/L — ABNORMAL HIGH (ref 3.5–5.1)
Potassium: 5.4 mmol/L — ABNORMAL HIGH (ref 3.5–5.1)
Potassium: 5.3 mmol/L — ABNORMAL HIGH (ref 3.5–5.1)
Sodium: 134 mmol/L — ABNORMAL LOW (ref 135–145)
Sodium: 134 mmol/L — ABNORMAL LOW (ref 135–145)
Sodium: 134 mmol/L — ABNORMAL LOW (ref 135–145)
Sodium: 132 mmol/L — ABNORMAL LOW (ref 135–145)
TCO2: 32 mmol/L (ref 22–32)
TCO2: 31 mmol/L (ref 22–32)
TCO2: 29 mmol/L (ref 22–32)
TCO2: 30 mmol/L (ref 22–32)
pCO2 arterial: 35.2 mmHg (ref 32.0–48.0)
pCO2 arterial: 51.4 mmHg — ABNORMAL HIGH (ref 32.0–48.0)
pCO2 arterial: 47.6 mmHg (ref 32.0–48.0)
pCO2 arterial: 50.3 mmHg — ABNORMAL HIGH (ref 32.0–48.0)
pH, Arterial: 7.535 — ABNORMAL HIGH (ref 7.350–7.450)
pH, Arterial: 7.355 (ref 7.350–7.450)
pH, Arterial: 7.367 (ref 7.350–7.450)
pH, Arterial: 7.36 (ref 7.350–7.450)
pO2, Arterial: 122 mmHg — ABNORMAL HIGH (ref 83.0–108.0)
pO2, Arterial: 84 mmHg (ref 83.0–108.0)
pO2, Arterial: 75 mmHg — ABNORMAL LOW (ref 83.0–108.0)
pO2, Arterial: 86 mmHg (ref 83.0–108.0)

## 2019-12-13 LAB — POCT I-STAT, CHEM 8
BUN: 30 mg/dL — ABNORMAL HIGH (ref 8–23)
BUN: 33 mg/dL — ABNORMAL HIGH (ref 8–23)
BUN: 36 mg/dL — ABNORMAL HIGH (ref 8–23)
BUN: 38 mg/dL — ABNORMAL HIGH (ref 8–23)
BUN: 40 mg/dL — ABNORMAL HIGH (ref 8–23)
Calcium, Ion: 0.93 mmol/L — ABNORMAL LOW (ref 1.15–1.40)
Calcium, Ion: 0.94 mmol/L — ABNORMAL LOW (ref 1.15–1.40)
Calcium, Ion: 0.94 mmol/L — ABNORMAL LOW (ref 1.15–1.40)
Calcium, Ion: 0.96 mmol/L — ABNORMAL LOW (ref 1.15–1.40)
Calcium, Ion: 0.99 mmol/L — ABNORMAL LOW (ref 1.15–1.40)
Chloride: 96 mmol/L — ABNORMAL LOW (ref 98–111)
Chloride: 96 mmol/L — ABNORMAL LOW (ref 98–111)
Chloride: 97 mmol/L — ABNORMAL LOW (ref 98–111)
Chloride: 97 mmol/L — ABNORMAL LOW (ref 98–111)
Chloride: 98 mmol/L (ref 98–111)
Creatinine, Ser: 7.1 mg/dL — ABNORMAL HIGH (ref 0.44–1.00)
Creatinine, Ser: 7.2 mg/dL — ABNORMAL HIGH (ref 0.44–1.00)
Creatinine, Ser: 7.3 mg/dL — ABNORMAL HIGH (ref 0.44–1.00)
Creatinine, Ser: 7.6 mg/dL — ABNORMAL HIGH (ref 0.44–1.00)
Creatinine, Ser: 7.7 mg/dL — ABNORMAL HIGH (ref 0.44–1.00)
Glucose, Bld: 101 mg/dL — ABNORMAL HIGH (ref 70–99)
Glucose, Bld: 128 mg/dL — ABNORMAL HIGH (ref 70–99)
Glucose, Bld: 141 mg/dL — ABNORMAL HIGH (ref 70–99)
Glucose, Bld: 163 mg/dL — ABNORMAL HIGH (ref 70–99)
Glucose, Bld: 166 mg/dL — ABNORMAL HIGH (ref 70–99)
HCT: 33 % — ABNORMAL LOW (ref 36.0–46.0)
HCT: 33 % — ABNORMAL LOW (ref 36.0–46.0)
HCT: 34 % — ABNORMAL LOW (ref 36.0–46.0)
HCT: 34 % — ABNORMAL LOW (ref 36.0–46.0)
HCT: 35 % — ABNORMAL LOW (ref 36.0–46.0)
Hemoglobin: 11.2 g/dL — ABNORMAL LOW (ref 12.0–15.0)
Hemoglobin: 11.2 g/dL — ABNORMAL LOW (ref 12.0–15.0)
Hemoglobin: 11.6 g/dL — ABNORMAL LOW (ref 12.0–15.0)
Hemoglobin: 11.6 g/dL — ABNORMAL LOW (ref 12.0–15.0)
Hemoglobin: 11.9 g/dL — ABNORMAL LOW (ref 12.0–15.0)
Potassium: 4.9 mmol/L (ref 3.5–5.1)
Potassium: 5.3 mmol/L — ABNORMAL HIGH (ref 3.5–5.1)
Potassium: 5.3 mmol/L — ABNORMAL HIGH (ref 3.5–5.1)
Potassium: 5.3 mmol/L — ABNORMAL HIGH (ref 3.5–5.1)
Potassium: 5.8 mmol/L — ABNORMAL HIGH (ref 3.5–5.1)
Sodium: 133 mmol/L — ABNORMAL LOW (ref 135–145)
Sodium: 133 mmol/L — ABNORMAL LOW (ref 135–145)
Sodium: 133 mmol/L — ABNORMAL LOW (ref 135–145)
Sodium: 134 mmol/L — ABNORMAL LOW (ref 135–145)
Sodium: 135 mmol/L (ref 135–145)
TCO2: 26 mmol/L (ref 22–32)
TCO2: 27 mmol/L (ref 22–32)
TCO2: 27 mmol/L (ref 22–32)
TCO2: 28 mmol/L (ref 22–32)
TCO2: 28 mmol/L (ref 22–32)

## 2019-12-13 LAB — BASIC METABOLIC PANEL
Anion gap: 12 (ref 5–15)
Anion gap: 12 (ref 5–15)
Anion gap: 14 (ref 5–15)
BUN: 41 mg/dL — ABNORMAL HIGH (ref 8–23)
BUN: 42 mg/dL — ABNORMAL HIGH (ref 8–23)
BUN: 41 mg/dL — ABNORMAL HIGH (ref 8–23)
CO2: 25 mmol/L (ref 22–32)
CO2: 25 mmol/L (ref 22–32)
CO2: 24 mmol/L (ref 22–32)
Calcium: 7.6 mg/dL — ABNORMAL LOW (ref 8.9–10.3)
Calcium: 7.6 mg/dL — ABNORMAL LOW (ref 8.9–10.3)
Calcium: 7.4 mg/dL — ABNORMAL LOW (ref 8.9–10.3)
Chloride: 96 mmol/L — ABNORMAL LOW (ref 98–111)
Chloride: 98 mmol/L (ref 98–111)
Chloride: 97 mmol/L — ABNORMAL LOW (ref 98–111)
Creatinine, Ser: 7.92 mg/dL — ABNORMAL HIGH (ref 0.44–1.00)
Creatinine, Ser: 8.07 mg/dL — ABNORMAL HIGH (ref 0.44–1.00)
Creatinine, Ser: 7.34 mg/dL — ABNORMAL HIGH (ref 0.44–1.00)
GFR calc Af Amer: 6 mL/min — ABNORMAL LOW (ref >60)
GFR calc Af Amer: 6 mL/min — ABNORMAL LOW (ref >60)
GFR calc Af Amer: 6 mL/min — ABNORMAL LOW (ref >60)
GFR calc non Af Amer: 5 mL/min — ABNORMAL LOW (ref >60)
GFR calc non Af Amer: 5 mL/min — ABNORMAL LOW (ref >60)
GFR calc non Af Amer: 5 mL/min — ABNORMAL LOW (ref >60)
Glucose, Bld: 140 mg/dL — ABNORMAL HIGH (ref 70–99)
Glucose, Bld: 129 mg/dL — ABNORMAL HIGH (ref 70–99)
Glucose, Bld: 143 mg/dL — ABNORMAL HIGH (ref 70–99)
Potassium: 5.5 mmol/L — ABNORMAL HIGH (ref 3.5–5.1)
Potassium: 5.6 mmol/L — ABNORMAL HIGH (ref 3.5–5.1)
Potassium: 5.5 mmol/L — ABNORMAL HIGH (ref 3.5–5.1)
Sodium: 133 mmol/L — ABNORMAL LOW (ref 135–145)
Sodium: 135 mmol/L (ref 135–145)
Sodium: 135 mmol/L (ref 135–145)

## 2019-12-13 LAB — POCT ACTIVATED CLOTTING TIME
Activated Clotting Time: 120 seconds
Activated Clotting Time: 142 seconds
Activated Clotting Time: 158 seconds
Activated Clotting Time: 158 seconds
Activated Clotting Time: 158 seconds
Activated Clotting Time: 153 seconds
Activated Clotting Time: 158 seconds

## 2019-12-13 LAB — GLUCOSE, CAPILLARY
Glucose-Capillary: 137 mg/dL — ABNORMAL HIGH (ref 70–99)
Glucose-Capillary: 135 mg/dL — ABNORMAL HIGH (ref 70–99)
Glucose-Capillary: 150 mg/dL — ABNORMAL HIGH (ref 70–99)
Glucose-Capillary: 171 mg/dL — ABNORMAL HIGH (ref 70–99)
Glucose-Capillary: 108 mg/dL — ABNORMAL HIGH (ref 70–99)

## 2019-12-13 LAB — CBC
HCT: 32.4 % — ABNORMAL LOW (ref 36.0–46.0)
Hemoglobin: 10.3 g/dL — ABNORMAL LOW (ref 12.0–15.0)
MCH: 31.7 pg (ref 26.0–34.0)
MCHC: 31.8 g/dL (ref 30.0–36.0)
MCV: 99.7 fL (ref 80.0–100.0)
Platelets: 248 10*3/uL (ref 150–400)
RBC: 3.25 MIL/uL — ABNORMAL LOW (ref 3.87–5.11)
RDW: 18.4 % — ABNORMAL HIGH (ref 11.5–15.5)
WBC: 9.5 10*3/uL (ref 4.0–10.5)
nRBC: 0.2 % (ref 0.0–0.2)

## 2019-12-13 MED ORDER — HEPARIN BOLUS VIA INFUSION (CRRT)
1000.0000 [IU] | INTRAVENOUS | Status: DC | PRN
  Administered 2019-12-13 (×2): 1000 [IU] via INTRAVENOUS_CENTRAL
  Filled 2019-12-13: qty 1000

## 2019-12-13 MED ORDER — SODIUM POLYSTYRENE SULFONATE 15 GM/60ML PO SUSP
30.0000 g | Freq: Once | ORAL | Status: AC
  Administered 2019-12-13: 30 g
  Filled 2019-12-13: qty 120

## 2019-12-13 MED ORDER — HEPARIN (PORCINE) 2000 UNITS/L FOR CRRT
INTRAVENOUS_CENTRAL | Status: DC | PRN
  Filled 2019-12-13 (×3): qty 1000

## 2019-12-13 MED ORDER — PRISMASOL BGK 4/2.5 32-4-2.5 MEQ/L IV SOLN: INTRAVENOUS | Status: DC

## 2019-12-13 MED ORDER — PRISMASOL BGK 4/2.5 32-4-2.5 MEQ/L REPLACEMENT SOLN: Status: DC

## 2019-12-13 MED ORDER — SODIUM CHLORIDE 0.9 % IV SOLN
250.0000 [IU]/h | INTRAVENOUS | Status: DC
  Administered 2019-12-13: 1150 [IU]/h via INTRAVENOUS_CENTRAL
  Administered 2019-12-13: 250 [IU]/h via INTRAVENOUS_CENTRAL
  Administered 2019-12-14 (×4): 1400 [IU]/h via INTRAVENOUS_CENTRAL
  Administered 2019-12-15: 1350 [IU]/h via INTRAVENOUS_CENTRAL
  Administered 2019-12-15: 1300 [IU]/h via INTRAVENOUS_CENTRAL
  Administered 2019-12-15: 1350 [IU]/h via INTRAVENOUS_CENTRAL
  Administered 2019-12-16: 19:00:00 1000 [IU]/h via INTRAVENOUS_CENTRAL
  Administered 2019-12-16: 1150 [IU]/h via INTRAVENOUS_CENTRAL
  Administered 2019-12-16: 1100 [IU]/h via INTRAVENOUS_CENTRAL
  Administered 2019-12-17 (×2): 900 [IU]/h via INTRAVENOUS_CENTRAL
  Administered 2019-12-18: 21:00:00 1000 [IU]/h via INTRAVENOUS_CENTRAL
  Administered 2019-12-18 (×2): 900 [IU]/h via INTRAVENOUS_CENTRAL
  Administered 2019-12-19 (×2): 1200 [IU]/h via INTRAVENOUS_CENTRAL
  Filled 2019-12-13 (×7): qty 2
  Filled 2019-12-13: qty 10000
  Filled 2019-12-13 (×11): qty 2

## 2019-12-13 MED ORDER — HEPARIN SODIUM (PORCINE) 1000 UNIT/ML DIALYSIS
1000.0000 [IU] | INTRAMUSCULAR | Status: DC | PRN
  Administered 2019-12-14 – 2019-12-19 (×2): 3000 [IU] via INTRAVENOUS_CENTRAL
  Filled 2019-12-13: qty 3
  Filled 2019-12-13 (×3): qty 6
  Filled 2019-12-13: qty 4

## 2019-12-13 MED ORDER — PANTOPRAZOLE SODIUM 40 MG PO PACK
40.0000 mg | PACK | Freq: Every day | ORAL | Status: DC
  Administered 2019-12-13 – 2019-12-22 (×10): 40 mg
  Filled 2019-12-13 (×11): qty 20

## 2019-12-13 MED ORDER — CALCIUM GLUCONATE-NACL 1-0.675 GM/50ML-% IV SOLN
1.0000 g | Freq: Once | INTRAVENOUS | Status: AC
  Administered 2019-12-13: 05:00:00 1000 mg via INTRAVENOUS
  Filled 2019-12-13: qty 50

## 2019-12-13 NOTE — Progress Notes (Signed)
[place] Progress Note Patient Name: [name] DOB: Oct 18, 1956 MRN: [id number]   Date of Service  12/13/2019  HPI/Events of Note  Hypocalcemia - Ca++ = 8.18 which corrects to 8.18 (low) given albumin = 2.9.  eICU Interventions  Will replace Ca++.     Intervention Category Major Interventions: Electrolyte abnormality - evaluation and management  [name] 12/13/2019, 5:09 AM

## 2019-12-13 NOTE — Progress Notes (Signed)
LTM EEG discontinued - skin breakdown noted at Fp2 only

## 2019-12-13 NOTE — Progress Notes (Signed)
[place] Progress Note Patient Name: [name] DOB: [birth year] MRN: [id number]   Date of Service  12/13/2019  HPI/Events of Note  ABG on 30%/PRVC 12/TV 450/P 5 = 7.35/51.4/84.0  eICU Interventions  Continue present ventilator management.      Intervention Category Major Interventions: Respiratory failure - evaluation and management  Kaylie Ritter [place] 12/13/2019, 4:16 AM

## 2019-12-13 NOTE — Progress Notes (Signed)
Progress Note  Patient Name: [name] Date of Encounter: 12/13/2019  Primary Cardiologist: Hilty  Subjective   Remains intubated. Sedation being weaned. Continues on rewarming/temperature stabilization portion of targeted temperature management. No acute events overnight.  Inpatient Medications    Scheduled Meds:  aspirin  81 mg Per NG tube Daily   atorvastatin  10 mg Oral QPM   chlorhexidine gluconate (MEDLINE KIT)  15 mL Mouth Rinse BID   Chlorhexidine Gluconate Cloth  6 each Topical Q0600   clopidogrel  75 mg Oral QPM   feeding supplement (PRO-STAT SUGAR FREE 64)  30 mL Per Tube BID   feeding supplement (VITAL HIGH PROTEIN)  1,000 mL Per Tube Q24H   heparin injection (subcutaneous)  5,000 Units Subcutaneous Q8H   insulin aspart  3-9 Units Subcutaneous Q4H   liver oil-zinc oxide   Topical BID   mouth rinse  15 mL Mouth Rinse 10 times per day   pantoprazole (PROTONIX) IV  40 mg Intravenous QHS   sodium chloride flush  10-40 mL Intracatheter Q12H   sodium chloride flush  3 mL Intravenous Q12H   sodium polystyrene  30 g Per Tube Once   Continuous Infusions:  sodium chloride 10 mL/hr at 12/03/2019 1445   sodium chloride 10 mL/hr at 12/13/19 0800   fentaNYL infusion INTRAVENOUS 100 mcg/hr (12/13/19 0800)   midazolam 1 mg/hr (12/13/19 0800)   norepinephrine (LEVOPHED) Adult infusion 27 mcg/min (12/13/19 0800)   PRN Meds: sodium chloride, acetaminophen, fentaNYL, ondansetron (ZOFRAN) IV, sodium chloride flush, sodium chloride flush   Vital Signs    Vitals:   12/13/19 0800 12/13/19 0810 12/13/19 0815 12/13/19 0830  BP: (!) 120/45 (!) 144/48    Pulse:  74    Resp: _0 Temp: 98.2 F (36.8 C)   98.6 F (37 C)  TempSrc: Esophageal   Esophageal  SpO2:  98%    Weight:      Height:        Intake/Output Summary (Last 24 hours) at 12/13/2019 0851 Last data filed at 12/13/2019 0800 Gross per 24 hour  Intake 2151.31 ml  Output 0 ml  Net  2151.31 ml   Last 3 Weights 12/13/2019 11/26/2019 12/04/2019  Weight (lbs) 214 lb 4.6 oz 222 lb 10.6 oz 222 lb 10.6 oz  Weight (kg) 97.2 kg 101 kg 101 kg      Telemetry    Sinus rhythm/sinus bradycardia - Personally Reviewed  ECG    Sinus brady, prolonged QTc, PVC, resolving ST elevation - Personally Reviewed  Physical Exam   GEN: intubated, targeted temperature management Neck: lines in place Cardiac: RRR, no murmurs, rubs, or gallops.  Respiratory: Mechanical breath sounds, intubated. Diffuse rhonchi GI: Soft, nontender, non-distended  MS: no significant edema; bilateral prior TMA in feet Neuro:  Nonfocal. Does not withdraw to pain. Does not open eyes Psych: not responsive  Labs    High Sensitivity Troponin:   Recent Labs  Lab 12/01/2019 1241 12/20/2019 1644 12/19/2019 1830 12/12/19 0200 12/12/19 1054  TROPONINIHS 24* 46* 56* 71* 54*      Chemistry Recent Labs  Lab 12/12/19 0800 12/12/19 1411 12/12/19 2356 12/13/19 0230 12/13/19 0338 12/13/19 0350 12/13/19 0611 12/13/19 0806  NA 135 135 133* 133* 133* 134* 134* 132*  K 4.2 4.8 5.3* 5.3* 5.1 5.2* 5.4* 5.3*  CL 95* 96* 96* 97* 95*  --   --   --   CO2 26 26  --   --  25  --   --   --  GLUCOSE 140* 125* 163* 166* 145*  --   --   --   BUN 27* 29* 33* 36* 38*  --   --   --   CREATININE 7.15* 7.04* 7.30* 7.60* 7.44*  --   --   --   CALCIUM 7.9* 7.7*  --   --  7.3*  --   --   --   ALBUMIN  --   --   --   --  2.9*  --   --   --   GFRNONAA 6* 6*  --   --  5*  --   --   --   GFRAA 6* 7*  --   --  6*  --   --   --   ANIONGAP 14 13  --   --  13  --   --   --      Hematology Recent Labs  Lab 12/13/2019 1241 12/12/19 0107 12/13/19 0338 12/13/19 0350 12/13/19 0611 12/13/19 0806  WBC 10.1 6.1 9.5  --   --   --   RBC 3.18* 3.06* 3.25*  --   --   --   HGB 10.3* 9.8* 10.3* 11.6* 11.6* 10.9*  HCT 32.6* 29.6* 32.4* 34.0* 34.0* 32.0*  MCV 102.5* 96.7 99.7  --   --   --   MCH 32.4 32.0 31.7  --   --   --   MCHC 31.6  33.1 31.8  --   --   --   RDW 18.4* 18.1* 18.4*  --   --   --   PLT 271 198 248  --   --   --     BNPNo results for input(s): BNP, PROBNP in the last 168 hours.   DDimer No results for input(s): DDIMER in the last 168 hours.   Radiology    CARDIAC CATHETERIZATION  Result Date: 12/13/2019 Conclusions: 1. 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  The distal LAD is small and diffusely diseased. 2. Severely reduced left ventricular systolic function with mid and apical akinesis and aneurysm formation. 3. Mildly elevated left ventricular filling pressure. 4. PTCA of mid LAD and D2 with reestablishment of TIMI-3 flow and ~70% residual stenosis.  Given small vessel size and heavy calcification, stent placement was not attempted.  Recommendations: 1. Post-arrest care per critical care medicine, including therapeutic hypothermia. 2. Aspirin and clopidogrel for at least 12 months, ideally longer. 3. Amiodarone infusion x 24 hours; consider transitioning to enteric amiodarone thereafter when able. 4. Initiate evidence based HF therapy as blood pressure tolerates. 5. Wean norepinephrine infusion, as BP allows. 6. Consider EP consultation to reconsider ICD placement for secondary prevention, if the patient makes a meaningful neurologic and functional recovery. [name], MD [facility]   DG CHEST PORT 1 VIEW  Result Date: 12/12/2019 CLINICAL DATA:  63 year old female status post hemodialysis catheter placement. Positive COVID-19. EXAM: PORTABLE CHEST 1 VIEW COMPARISON:  11/29/2019 portable chest and earlier. FINDINGS: Portable AP semi upright view at 1718 hours. Endotracheal tube tip at the level the clavicles. Enteric tube courses to the abdomen, tip not included. New left IJ approach central line with catheter tip at the level of the cavoatrial junction. Stable lung volumes although decreased left lung base ventilation  with obscuration of the left hemidiaphragm now. Stable cardiac size and mediastinal contours. No pneumothorax. No pulmonary edema. Calcified aortic atherosclerosis. The right lung is  stable. IMPRESSION: 1. New left IJ approach central line with catheter tip at the level of the cavoatrial junction. No pneumothorax. 2.  Otherwise stable lines and tubes. 3. Decreased left lung base ventilation since yesterday compatible with collapse or consolidation. Electronically Signed   By: Genevie Ann M.D.   On: 12/12/2019 17:32   DG Chest Portable 1 View  Result Date: 12/02/2019 CLINICAL DATA:  Patient intubated.  COVID-19 positive. EXAM: PORTABLE CHEST 1 VIEW COMPARISON:  10/25/2019 FINDINGS: Endotracheal tube tip lies 1 cm above the carina. Nasal/orogastric tube passes below the diaphragm, into the stomach and below the included field of view. Lungs demonstrate prominent bronchovascular markings. Possible subtle right upper and lower lung zone infiltrate. No pleural effusion or pneumothorax. Cardiac silhouette is borderline enlarged. IMPRESSION: 1. Well-positioned endotracheal and nasogastric tubes. 2. Subtle hazy infiltrate suggested in the right lung. No evidence of pulmonary edema. Electronically Signed   By: Lajean Manes M.D.   On: 11/26/2019 12:53   EEG adult  Result Date: 11/28/2019 Lora Havens, MD     12/01/2019  6:16 PM Patient Name: [name] MRN: [id number] Epilepsy Attending: Lora Havens Referring Physician/Provider: Dr Harrell Gave End Date: 12/23/2019 Duration: 21.39 mins Patient history: 63yo F s/p cardiac arrest on TTM. EEG to evaluate for seizure Level of alertness: comatose AEDs during EEG study: Propofol Technical aspects: This EEG study was done with scalp electrodes positioned according to the 10-20 International system of electrode placement. Electrical activity was acquired at a sampling rate of _0  and reviewed with a high frequency filter of _1  and a low frequency filter of _2 .  EEG data were recorded continuously and digitally stored. DESCRIPTION: EEG showed generalized background suppression. EEG was not reactive to tactile stimulation. Hyperventilation and photic stimulation were not performed. ABNORMALITY - Background suppression, generalized IMPRESSION: This study is suggestive of profound severe encephalopathy, non specific to etiology. No seizures or epileptiform discharges were seen throughout the recording. Priyanka Barbra Sarks   Overnight EEG with video  Result Date: 12/12/2019 Lora Havens, MD     12/13/2019  8:49 AM Patient Name: [name] MRN: [id number] Epilepsy Attending: Lora Havens Referring Physician/Provider: Dr Harrell Gave End Duration: 12/22/2019 1759 to 12/12/2019 1759  Patient history: 63yo F s/p cardiac arrest on TTM. EEG to evaluate for seizure  Level of alertness: comatose  AEDs during EEG study: Propofol  Technical aspects: This EEG study was done with scalp electrodes positioned according to the 10-20 International system of electrode placement. Electrical activity was acquired at a sampling rate of _3  and reviewed with a high frequency filter of _4  and a low frequency filter of _5 . EEG data were recorded continuously and digitally stored.  DESCRIPTION: EEG showed continuous generalized 2 to 5 Hz theta-delta slowing.  EEG was reactive to tactile stimulation. Hyperventilation and photic stimulation were not performed.  ABNORMALITY -continuous slow, generalized IMPRESSION: This study is suggestive of severe encephalopathy, non specific to etiology. No seizures or epileptiform discharges were seen throughout the recording. EEG appears to be improving throughout the study.  Lora Havens   ECHOCARDIOGRAM COMPLETE  Result Date: 12/12/2019   ECHOCARDIOGRAM REPORT   Patient Name:   [name] Havel Date of Exam: 12/12/2019 Medical Rec #:  [id number]    Height:       65.0 in Accession #:    [id number]   Weight:       222.0 lb Date of Birth:   31-Jul-1956   BSA:  2.07 m Patient Age:    44 years     BP:           118/56 mmHg Patient Gender: F            HR:           45 bpm. Exam Location:  Inpatient Procedure: 2D Echo, Cardiac Doppler, Color Doppler and Intracardiac            Opacification Agent Indications:    Acute Myocardial Infarction 410  History:        Patient has prior history of Echocardiogram examinations, most                 recent 10/26/2019. CHF, Previous Myocardial Infarction and CAD,                 Pulmonary HTN, Signs/Symptoms:Murmur; Risk Factors:Hypertension,                 Diabetes and Dyslipidemia. ESRD.  Sonographer:    Jonelle Sidle Dance Referring Phys: Stanley  1. Left ventricular ejection fraction, by visual estimation, is <20%. The left ventricle has normal function. There is no left ventricular hypertrophy. The LV is mildly dilated with akinesis of the mid and apical segments. No evidence of LV thrombus.  2. Elevated left atrial and left ventricular end-diastolic pressures.  3. Left ventricular diastolic parameters are consistent with Grade II diastolic dysfunction (pseudonormalization).  4. Global right ventricle has normal systolic function.The right ventricular size is normal. No increase in right ventricular wall thickness.  5. Left atrial size was moderately dilated.  6. Right atrial size was normal.  7. The mitral valve is normal in structure. Mild mitral valve regurgitation. No evidence of mitral stenosis.  8. The tricuspid valve is normal in structure. Tricuspid valve regurgitation is trivial.  9. The aortic valve is normal in structure. Aortic valve regurgitation is not visualized. No evidence of aortic valve sclerosis or stenosis. 10. The pulmonic valve was normal in structure. Pulmonic valve regurgitation is trivial. 11. Normal pulmonary artery systolic pressure. 12. The inferior vena cava is normal in size with <50% respiratory variability, suggesting right atrial pressure of 8 mmHg.  FINDINGS  Left Ventricle: Left ventricular ejection fraction, by visual estimation, is <20%. The left ventricle has normal function. The left ventricle demonstrates global hypokinesis. The left ventricular internal cavity size was mildly dilated left ventricle. There is no left ventricular hypertrophy. Left ventricular diastolic parameters are consistent with Grade II diastolic dysfunction (pseudonormalization). Elevated left atrial and left ventricular end-diastolic pressures. Right Ventricle: The right ventricular size is normal. No increase in right ventricular wall thickness. Global RV systolic function is has normal systolic function. The tricuspid regurgitant velocity is 2.48 m/s, and with an assumed right atrial pressure  of 8 mmHg, the estimated right ventricular systolic pressure is normal at 32.6 mmHg. Left Atrium: Left atrial size was moderately dilated. Right Atrium: Right atrial size was normal in size Pericardium: There is no evidence of pericardial effusion. Mitral Valve: The mitral valve is normal in structure. There is mild thickening of the mitral valve leaflet(s). Mild mitral valve regurgitation. No evidence of mitral valve stenosis by observation. Tricuspid Valve: The tricuspid valve is normal in structure. Tricuspid valve regurgitation is trivial. Aortic Valve: The aortic valve is normal in structure. Aortic valve regurgitation is not visualized. The aortic valve is structurally normal, with no evidence of sclerosis or stenosis. Pulmonic Valve: The pulmonic valve was normal in structure.  Pulmonic valve regurgitation is trivial. Pulmonic regurgitation is trivial. Aorta: The aortic root, ascending aorta and aortic arch are all structurally normal, with no evidence of dilitation or obstruction. Venous: The inferior vena cava is normal in size with less than 50% respiratory variability, suggesting right atrial pressure of 8 mmHg. IAS/Shunts: No atrial level shunt detected by color flow Doppler.  There is no evidence of a patent foramen ovale. No ventricular septal defect is seen or detected. There is no evidence of an atrial septal defect.  LEFT VENTRICLE PLAX 2D LVIDd:         5.38 cm  Diastology LVIDs:         4.29 cm  LV e' lateral:   3.15 cm/s LV PW:         1.37 cm  LV E/e' lateral: 30.8 LV IVS:        1.09 cm  LV e' medial:    2.40 cm/s LVOT diam:     2.00 cm  LV E/e' medial:  40.4 LV SV:         57 ml LV SV Index:   26.20 LVOT Area:     3.14 cm  RIGHT VENTRICLE            IVC RV Basal diam:  2.93 cm    IVC diam: 1.99 cm RV S prime:     7.58 cm/s TAPSE (M-mode): 1.8 cm LEFT ATRIUM              Index       RIGHT ATRIUM           Index LA diam:        3.60 cm  1.74 cm/m  RA Area:     16.80 cm LA Vol (A2C):   102.0 ml 49.33 ml/m RA Volume:   48.20 ml  23.31 ml/m LA Vol (A4C):   82.1 ml  39.70 ml/m LA Biplane Vol: 91.6 ml  44.30 ml/m  AORTIC VALVE LVOT Vmax:   70.90 cm/s LVOT Vmean:  42.700 cm/s LVOT VTI:    0.181 m  AORTA Ao Root diam: 3.40 cm Ao Asc diam:  2.60 cm MITRAL VALVE                         TRICUSPID VALVE MV Area (PHT): 2.14 cm              TR Peak grad:   24.6 mmHg MV PHT:        102.66 msec           TR Vmax:        248.00 cm/s MV Decel Time: 354 msec MR Peak grad:    109.4 mmHg          SHUNTS MR Mean grad:    71.0 mmHg           Systemic VTI:  0.18 m MR Vmax:         523.00 cm/s         Systemic Diam: 2.00 cm MR Vmean:        394.0 cm/s MR PISA:         0.57 cm MR PISA Eff ROA: 4 mm MR PISA Radius:  0.30 cm MV E velocity: 97.00 cm/s  103 cm/s MV A velocity: 102.00 cm/s 70.3 cm/s MV E/A ratio:  0.95        1.5  Fransico Him MD Electronically signed by Fransico Him MD Signature  Date/Time: 12/12/2019/11:05:57 AM    Final     Cardiac Studies   Eco 12/12/19  1. Left ventricular ejection fraction, by visual estimation, is <20%. The left ventricle has normal function. There is no left ventricular hypertrophy. The LV is mildly dilated with akinesis of the mid and apical segments.  No evidence of LV thrombus.  2. Elevated left atrial and left ventricular end-diastolic pressures.  3. Left ventricular diastolic parameters are consistent with Grade II diastolic dysfunction (pseudonormalization).  4. Global right ventricle has normal systolic function.The right ventricular size is normal. No increase in right ventricular wall thickness.  5. Left atrial size was moderately dilated.  6. Right atrial size was normal.  7. The mitral valve is normal in structure. Mild mitral valve regurgitation. No evidence of mitral stenosis.  8. The tricuspid valve is normal in structure. Tricuspid valve regurgitation is trivial.  9. The aortic valve is normal in structure. Aortic valve regurgitation is not visualized. No evidence of aortic valve sclerosis or stenosis. 10. The pulmonic valve was normal in structure. Pulmonic valve regurgitation is trivial. 11. Normal pulmonary artery systolic pressure. 12. The inferior vena cava is normal in size with <50% respiratory variability, suggesting right atrial pressure of 8 mmHg.  Cath 12/09/2019 FINDINGS: 1. Severe 2-vessel CAD with CTO of ostial RCA and subacute or chronic total occlusion of mid LAD. 2. Severely reduced LVEF with mid and apical akinesis. 3. Mildly elevated LVEDP. 4. PTCA of mid LAD and D2 with reestablishment of TIMI-3 flow.  Given small vessel size and heavy calcification, stent placement was not attempted.  RECOMMENDATIONS: 1. Post-arrest care per CCM. 2. ASA and clopidogrel for at least 12 months. 3. Amiodarone infusion x 24 hours. 4. Initiate evidence based HF therapy as blood pressure tolerates. 5. Wean norepinephrine infusion, as BP allows.  Patient Profile     63 y.o. female who had witnessed cardiac arrest at assisted living facility. Had immediate CPR, AED recommended shock x1, defibrillated to PEA, CPR continued. ROSC achieved, and on arrival to ER had a pulse but was unresponsive. ECG showed anterolateral and ST  elevation. She was taken emergently for cardiac catheterization 11/26/2019 (findings as noted) and is receiving targeted temperature management.  Assessment & Plan    Cardiac arrest, STEMI:  Overall prognosis is guarded to poor. This has been communicated with her son (see note from Dr. Saunders Revel). Son wishes attempt all care at this time.  -cath 12/17 with severe 2V CAD, with CTO of ostial RCA and subacute/chronic total occlusion of mLAD. S/P balloon of mLAD and D2 with re-establishment of flow (small vessel/calcification precluded stent placement) -undergoing targeted temperature management -remains without purposeful movements, though sedation not completely weaned at this time. Does not withdraw to pain but per nursing does have gag reflex -EEG with diffuse slowing -final prognostication after full rewarming, weaning of sedation,  -received IV amiodarone given presumed VF arrest (AED recommended shock, STEMI on ECG) -prior history of VT arrest 11/2014 , without ICD placement at that time due to poor prognosis [facility] term (see note 12/16/2014) -on aspirin, clopidogrel -on atorvastatin (10 mg dose) -very prolonged QT on yesterday's ECG. Off amiodarone drip now, but avoid any QT prolonging medications.  ESRD previously on dialysis: appreciated nephrology input. Trialysis catheter inserted for CVVH as she is too unstable for intermittent hemodialysis at this time. -anemia of chronic disease as well  Acute hypoxic respiratory failure: remains intubated  Severe ischemic cardiomyopathy, EF <20% this admission, prior 25-30% 10/26/19. 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 Total critical care time: 55 minutes. This time includes gathering of history, evaluation of patient's response to treatment, examination of patient, review of laboratory and imaging studies, and coordination with consultants.   For questions or updates, please contact [place] Please consult [email or web address] for contact info under     Signed, Buford Dresser, MD  12/13/2019, 8:51 AM

## 2019-12-13 NOTE — Progress Notes (Signed)
[place] Progress Note Patient Name: [name] DOB: 09-08-1956 MRN: [id number]   Date of Service  12/13/2019  HPI/Events of Note  Hyperkalemia - K+ = 5.2.  eICU Interventions  Will order: 1. Kayexalate 30 gm per tube now.      Intervention Category Major Interventions: Electrolyte abnormality - evaluation and management  Arpan Eskelson [place] 12/13/2019, 6:29 AM

## 2019-12-13 NOTE — Progress Notes (Signed)
NAME:  [name], MRN:  [id number], DOB:  [birth year], LOS: 2 ADMISSION DATE:  12/18/2019, CONSULTATION DATE: 12/04/2019 REFERRING MD: Dr. Saunders Revel, Cardiology, CHIEF COMPLAINT: Cardiac arrest, acute respiratory failure  Brief History   75, DM, CAD with CM, COVID-19 pneumonia early November.  Admitted 12/17 from SNF after acute arrest, presumed ventricular arrhythmia.  CPR 15 minutes. Partially successful PTCI LAD 12/17, no stent.  To ICU for TTM 33.   Past Medical History   has a past medical history of Anemia, Anemia in CKD (chronic kidney disease) (12/14/2014), Benign hypertension with ESRD (end-stage renal disease) ([place]) (02/12/2018), Bilateral leg edema, Blind, Cardiac arrest ([place]) (12/12/2014), CHF (congestive heart failure) ([place]), Coronary artery disease, [place], Diabetes mellitus, ESRD on hemodialysis ([place]), Essential hypertension (08/09/2009), Fibroid uterus, Heart murmur, Hyperlipidemia, Hypertension, Leiomyoma of uterus (08/09/2009), MI (myocardial infarction) ([place]) (11/2014), Moderate tricuspid regurgitation, Osteomyelitis ([place]), Psychiatric disorder, and Pulmonary hypertension ([place]).   Significant Hospital Events   Cardiac arrest 12/17 Cardiac catheterization 12/17  Consults:  Cardiology Nephrology  Procedures:  Left heart catheterization 12/17 >>   Significant Diagnostic Tests:   EEG 12/17 > profound severe encephalopathy, non specific to etiology. No seizures or epileptiform discharges  TTE 12/17 >>   Micro Data:    Antimicrobials:    Interim history/subjective:    12/13/2019 - eeg stopped. On CRRt. On fent gtt. On levophed gtt. Off Nimbex. Rewarmed early hours today. MAP Goal > 70 currently.. On vent   Objective   Blood pressure (!) 131/57, pulse 83, temperature 98.6 F (37 C), temperature source Esophageal, resp. rate (!) 22, height 5\' 5"  (1.651 m), weight 97.2 kg, SpO2 97 %. CVP:  [4 mmHg-18 mmHg] 18 mmHg  Vent Mode: PRVC FiO2 (%):  [30 %] 30 % Set Rate:  [12  bmp] 12 bmp Vt Set:  [450 mL] 450 mL PEEP:  [5 cmH20] 5 cmH20 Plateau Pressure:  [11 cmH20-23 cmH20] 23 cmH20   Intake/Output Summary (Last 24 hours) at 12/13/2019 1432 Last data filed at 12/13/2019 1300 Gross per 24 hour  Intake 2031.26 ml  Output 101 ml  Net 1930.26 ml   Filed Weights   12/16/2019 1441 12/12/2019 1445 12/13/19 0515  Weight: 101 kg 101 kg 97.2 kg     General Appearance:  Looks criticall ill Obese + Head:  Normocephalic, without obvious abnormality, atraumatic Eyes:  PERRL - yes, conjunctiva/corneas - muddy     Ears:  Normal external ear canals, both ears Nose:  G tube - no Throat:  ETT TUBE - yes , OG tube - yes Neck:  Supple,  No enlargement/tenderness/nodules Lungs: Clear to auscultation bilaterally, Ventilator   Synchrony - yes Heart:  S1 and S2 normal, no murmur, CVP - x.  Pressors - levophed + Abdomen:  Soft, no masses, no organomegaly Genitalia / Rectal:  Not done Extremities:  Extremities- intact with edema Skin:  ntact in exposed areas . Sacral area - not examined Neurologic:  Sedation - fent gtt, versed gtt -> RASS - -4 but did squeeze hand to command      Resolved Hospital Problem list     Assessment & Plan:   Acute cardiac arrest, presumed VT/VF given her history of CAD and prior ventricular arrhythmias, shockable initial rhythm with AED. - TTM 33C  12/13/2019 - rewarmed  plan - Sedation,  serial labs as per hypothermia protocol - Continue home amiodarone  Coronary artery disease, acute STEMI -Post cardiac catheterization with attempted PTCI to LAD, only partially successful, no stent placed -  Anticoagulation and ASA, antiplatelets post cardiac catheterization.   Plan -  Appreciate cardiology management    Acute respiratory failure with hypoxemia due to cardiac arrest   12/13/2019 - > does not meet criteria for SBT/Extubation in setting of Acute Respiratory Failure due to cardiac arrest  PLAn  - Plan for ventilator management  with PRVC 8 cc/kg, wean PEEP and FiO2 - No SBT until rewarmed and sedation weaned.  - VAP prevention order set  Shock, likely cardiogenic with possible component of sedating medications - 12/13/2019 - on levophed. MAP goal > 70  PLAN Levophed for MAP > 70  End-stage renal disease, chronic dialysis - Nephrology following. Would need CRRT at this point, but access is an issue.   Anion gap metabolic acidosis improved - lactic cleared - follow BMP  History of hypertension -Home antihypertensive medications on hold  Diabetes mellitus -ICU hyperglycemia protocol  Acute encephalopathy, consider toxic metabolic.  Also at risk for possible hypoxemic injury.  12/13/2019 - did follow command x1  Plan  - monitor   Chronic sacral pressure ulcer present at the time of admission -WOC consult and assistance  Best practice:  Diet: TF Pain/Anxiety/Delirium protocol (if indicated): Versed, fentanyl VAP protocol (if indicated): Ordered 12/17 DVT prophylaxis: Systemic anticoagulation post catheterization GI prophylaxis: PPI Glucose control: ICU Hg protocol Mobility: BR Code Status: Full Family Communication: son Ngoc Detjen updated Disposition: cath lab to ICU    ATTESTATION & SIGNATURE   The patient [name] is critically ill with multiple organ systems failure and requires high complexity decision making for assessment and support, frequent evaluation and titration of therapies, application of advanced monitoring technologies and extensive interpretation of multiple databases.   Critical Care Time devoted to patient care services described in this note is  30  Minutes. This time reflects time of care of this signee Dr Brand Males. This critical care time does not reflect procedure time, or teaching time or supervisory time of PA/NP/Med student/Med Resident etc but could involve care discussion time     Dr. Brand Males, M.D., [facility].C.P Pulmonary and Critical Care  Medicine Staff Physician [place] [facility] Pager: [phone], If no answer or between  15:00h - 7:00h: call [phone]  12/13/2019 2:33 PM     LABS    PULMONARY Recent Labs  Lab 12/12/19 0431 12/12/19 0802 12/13/19 0230 12/13/19 0350 12/13/19 0611 12/13/19 0806 12/13/19 1001  PHART 7.650* 7.535*  --  7.355 7.367 7.360  --   PCO2ART 25.5* 35.2  --  51.4* 47.6 50.3*  --   PO2ART 102.0 122.0*  --  84.0 75.0* 86.0  --   HCO3 28.8* 30.9*  --  29.1* 27.8 28.5*  --   TCO2 30 32 28 31 29 30 27   O2SAT 99.0 99.0  --  96.0 95.0 96.0  --     CBC Recent Labs  Lab 12/18/2019 1241 12/12/19 0107 12/13/19 0338 12/13/19 0611 12/13/19 0806 12/13/19 1001  HGB 10.3* 9.8* 10.3* 11.6* 10.9* 11.6*  HCT 32.6* 29.6* 32.4* 34.0* 32.0* 34.0*  WBC 10.1 6.1 9.5  --   --   --   PLT 271 198 248  --   --   --     COAGULATION Recent Labs  Lab 11/26/2019 1644 12/12/19 0058  INR 1.5* 1.2    CARDIAC  No results for input(s): TROPONINI in the last 168 hours. No results for input(s): PROBNP in the last  168 hours.   CHEMISTRY Recent Labs  Lab 12/12/19 0107 12/12/19 0431 12/12/19 1411 12/13/19 0338 12/13/19 0804 12/13/19 0806 12/13/19 0955 12/13/19 1001 12/13/19 1147  NA 135  --  135 133* 133* 132* 135 133* 135  K 3.8  --  4.8 5.1 5.5* 5.3* 5.6* 5.8* 5.5*  CL 96*   < > 96* 95* 96*  --  98 98 97*  CO2 26   < > 26 25 25   --  25  --  24  GLUCOSE 137*   < > 125* 145* 140*  --  129* 128* 143*  BUN 26*   < > 29* 38* 41*  --  42* 40* 41*  CREATININE 6.36*   < > 7.04* 7.44* 7.92*  --  8.07* 7.70* 7.34*  CALCIUM 7.7*   < > 7.7* 7.3* 7.6*  --  7.6*  --  7.4*  MG 2.4  --   --   --   --   --   --   --   --   PHOS 2.3*  --   --  4.6  --   --   --   --   --    < > = values in this interval not displayed.   Estimated Creatinine Clearance: 9.1 mL/min (A) (by C-G formula based on SCr of 7.34 mg/dL (H)).   LIVER Recent Labs  Lab 12/17/2019 1644  12/12/19 0058 12/13/19 0338  ALBUMIN  --   --  2.9*  INR 1.5* 1.2  --      INFECTIOUS No results for input(s): LATICACIDVEN, PROCALCITON in the last 168 hours.   ENDOCRINE CBG (last 3)  Recent Labs    12/13/19 0347 12/13/19 0804 12/13/19 1150  GLUCAP 137* 135* 150*         IMAGING x48h  - image(s) personally visualized  -   highlighted in bold DG CHEST PORT 1 VIEW  Result Date: 12/12/2019 CLINICAL DATA:  64 year old female status post hemodialysis catheter placement. Positive COVID-19. EXAM: PORTABLE CHEST 1 VIEW COMPARISON:  12/12/2019 portable chest and earlier. FINDINGS: Portable AP semi upright view at 1718 hours. Endotracheal tube tip at the level the clavicles. Enteric tube courses to the abdomen, tip not included. New left IJ approach central line with catheter tip at the level of the cavoatrial junction. Stable lung volumes although decreased left lung base ventilation with obscuration of the left hemidiaphragm now. Stable cardiac size and mediastinal contours. No pneumothorax. No pulmonary edema. Calcified aortic atherosclerosis. The right lung is stable. IMPRESSION: 1. New left IJ approach central line with catheter tip at the level of the cavoatrial junction. No pneumothorax. 2.  Otherwise stable lines and tubes. 3. Decreased left lung base ventilation since yesterday compatible with collapse or consolidation. Electronically Signed   By: Genevie Ann M.D.   On: 12/12/2019 17:32   EEG adult  Result Date: 12/04/2019 Lora Havens, MD     12/09/2019  6:16 PM Patient Name: [name] MRN: [id number] Epilepsy Attending: Lora Havens Referring Physician/Provider: Dr Harrell Gave End Date: 12/15/2019 Duration: 21.39 mins Patient history: 63yo F s/p cardiac arrest on TTM. EEG to evaluate for seizure Level of alertness: comatose AEDs during EEG study: Propofol Technical aspects: This EEG study was done with scalp electrodes positioned according to the 10-20 International  system of electrode placement. Electrical activity was acquired at a sampling rate of 500Hz  and reviewed with a high frequency filter of 70Hz  and a low frequency filter of  1Hz . EEG data were recorded continuously and digitally stored. DESCRIPTION: EEG showed generalized background suppression. EEG was not reactive to tactile stimulation. Hyperventilation and photic stimulation were not performed. ABNORMALITY - Background suppression, generalized IMPRESSION: This study is suggestive of profound severe encephalopathy, non specific to etiology. No seizures or epileptiform discharges were seen throughout the recording. Priyanka Barbra Sarks   Overnight EEG with video  Result Date: 12/12/2019 Lora Havens, MD     12/13/2019  8:49 AM Patient Name: [name] MRN: [id number] Epilepsy Attending: Lora Havens Referring Physician/Provider: Dr Harrell Gave End Duration: 12/03/2019 1759 to 12/12/2019 1759  Patient history: 63yo F s/p cardiac arrest on TTM. EEG to evaluate for seizure  Level of alertness: comatose  AEDs during EEG study: Propofol  Technical aspects: This EEG study was done with scalp electrodes positioned according to the 10-20 International system of electrode placement. Electrical activity was acquired at a sampling rate of 500Hz  and reviewed with a high frequency filter of 70Hz  and a low frequency filter of 1Hz . EEG data were recorded continuously and digitally stored.  DESCRIPTION: EEG showed continuous generalized 2 to 5 Hz theta-delta slowing.  EEG was reactive to tactile stimulation. Hyperventilation and photic stimulation were not performed.  ABNORMALITY -continuous slow, generalized IMPRESSION: This study is suggestive of severe encephalopathy, non specific to etiology. No seizures or epileptiform discharges were seen throughout the recording. EEG appears to be improving throughout the study.  Lora Havens   ECHOCARDIOGRAM COMPLETE  Result Date: 12/12/2019   ECHOCARDIOGRAM  REPORT   Patient Name:   FRITZIE PRIOLEAU Osburn Date of Exam: 12/12/2019 Medical Rec #:  [id number]    Height:       65.0 in Accession #:    [id number]   Weight:       222.0 lb Date of Birth:  12-12-1956   BSA:          2.07 m Patient Age:    69 years     BP:           118/56 mmHg Patient Gender: F            HR:           45 bpm. Exam Location:  Inpatient Procedure: 2D Echo, Cardiac Doppler, Color Doppler and Intracardiac            Opacification Agent Indications:    Acute Myocardial Infarction 410  History:        Patient has prior history of Echocardiogram examinations, most                 recent 10/26/2019. CHF, Previous Myocardial Infarction and CAD,                 Pulmonary HTN, Signs/Symptoms:Murmur; Risk Factors:Hypertension,                 Diabetes and Dyslipidemia. ESRD.  Sonographer:    Jonelle Sidle Dance Referring Phys: [place]  1. Left ventricular ejection fraction, by visual estimation, is <20%. The left ventricle has normal function. There is no left ventricular hypertrophy. The LV is mildly dilated with akinesis of the mid and apical segments. No evidence of LV thrombus.  2. Elevated left atrial and left ventricular end-diastolic pressures.  3. Left ventricular diastolic parameters are consistent with Grade II diastolic dysfunction (pseudonormalization).  4. Global right ventricle has normal systolic function.The right ventricular size is normal. No increase in right ventricular wall thickness.  5. Left  atrial size was moderately dilated.  6. Right atrial size was normal.  7. The mitral valve is normal in structure. Mild mitral valve regurgitation. No evidence of mitral stenosis.  8. The tricuspid valve is normal in structure. Tricuspid valve regurgitation is trivial.  9. The aortic valve is normal in structure. Aortic valve regurgitation is not visualized. No evidence of aortic valve sclerosis or stenosis. 10. The pulmonic valve was normal in structure. Pulmonic valve regurgitation is  trivial. 11. Normal pulmonary artery systolic pressure. 12. The inferior vena cava is normal in size with <50% respiratory variability, suggesting right atrial pressure of 8 mmHg. FINDINGS  Left Ventricle: Left ventricular ejection fraction, by visual estimation, is <20%. The left ventricle has normal function. The left ventricle demonstrates global hypokinesis. The left ventricular internal cavity size was mildly dilated left ventricle. There is no left ventricular hypertrophy. Left ventricular diastolic parameters are consistent with Grade II diastolic dysfunction (pseudonormalization). Elevated left atrial and left ventricular end-diastolic pressures. Right Ventricle: The right ventricular size is normal. No increase in right ventricular wall thickness. Global RV systolic function is has normal systolic function. The tricuspid regurgitant velocity is 2.48 m/s, and with an assumed right atrial pressure  of 8 mmHg, the estimated right ventricular systolic pressure is normal at 32.6 mmHg. Left Atrium: Left atrial size was moderately dilated. Right Atrium: Right atrial size was normal in size Pericardium: There is no evidence of pericardial effusion. Mitral Valve: The mitral valve is normal in structure. There is mild thickening of the mitral valve leaflet(s). Mild mitral valve regurgitation. No evidence of mitral valve stenosis by observation. Tricuspid Valve: The tricuspid valve is normal in structure. Tricuspid valve regurgitation is trivial. Aortic Valve: The aortic valve is normal in structure. Aortic valve regurgitation is not visualized. The aortic valve is structurally normal, with no evidence of sclerosis or stenosis. Pulmonic Valve: The pulmonic valve was normal in structure. Pulmonic valve regurgitation is trivial. Pulmonic regurgitation is trivial. Aorta: The aortic root, ascending aorta and aortic arch are all structurally normal, with no evidence of dilitation or obstruction. Venous: The inferior vena  cava is normal in size with less than 50% respiratory variability, suggesting right atrial pressure of 8 mmHg. IAS/Shunts: No atrial level shunt detected by color flow Doppler. There is no evidence of a patent foramen ovale. No ventricular septal defect is seen or detected. There is no evidence of an atrial septal defect.  LEFT VENTRICLE PLAX 2D LVIDd:         5.38 cm  Diastology LVIDs:         4.29 cm  LV e' lateral:   3.15 cm/s LV PW:         1.37 cm  LV E/e' lateral: 30.8 LV IVS:        1.09 cm  LV e' medial:    2.40 cm/s LVOT diam:     2.00 cm  LV E/e' medial:  40.4 LV SV:         57 ml LV SV Index:   26.20 LVOT Area:     3.14 cm  RIGHT VENTRICLE            IVC RV Basal diam:  2.93 cm    IVC diam: 1.99 cm RV S prime:     7.58 cm/s TAPSE (M-mode): 1.8 cm LEFT ATRIUM              Index       RIGHT ATRIUM  Index LA diam:        3.60 cm  1.74 cm/m  RA Area:     16.80 cm LA Vol (A2C):   102.0 ml 49.33 ml/m RA Volume:   48.20 ml  23.31 ml/m LA Vol (A4C):   82.1 ml  39.70 ml/m LA Biplane Vol: 91.6 ml  44.30 ml/m  AORTIC VALVE LVOT Vmax:   70.90 cm/s LVOT Vmean:  42.700 cm/s LVOT VTI:    0.181 m  AORTA Ao Root diam: 3.40 cm Ao Asc diam:  2.60 cm MITRAL VALVE                         TRICUSPID VALVE MV Area (PHT): 2.14 cm              TR Peak grad:   24.6 mmHg MV PHT:        102.66 msec           TR Vmax:        248.00 cm/s MV Decel Time: 354 msec MR Peak grad:    109.4 mmHg          SHUNTS MR Mean grad:    71.0 mmHg           Systemic VTI:  0.18 m MR Vmax:         523.00 cm/s         Systemic Diam: 2.00 cm MR Vmean:        394.0 cm/s MR PISA:         0.57 cm MR PISA Eff ROA: 4 mm MR PISA Radius:  0.30 cm MV E velocity: 97.00 cm/s  103 cm/s MV A velocity: 102.00 cm/s 70.3 cm/s MV E/A ratio:  0.95        1.5  Fransico Him MD Electronically signed by Fransico Him MD Signature Date/Time: 12/12/2019/11:05:57 AM    Final

## 2019-12-13 NOTE — Progress Notes (Signed)
[facility] Progress Note Patient Name: [name] DOB: 24-Apr-1956 MRN: [id number]   Date of Service  12/13/2019  HPI/Events of Note  Hyperkalemia - K+ = 5.3. Patient being rewarmed from TTM. Patient has Q 6 hour BMP ordered.   eICU Interventions  Will order: 1. Kayexalate 30 gm per tube now.     Intervention Category Major Interventions: Electrolyte abnormality - evaluation and management  Claudie Rathbone [place] 12/13/2019, 12:48 AM

## 2019-12-13 NOTE — Progress Notes (Signed)
Patients Potassium is 5.3. E-link notified. 12/13/19 12:41 AM

## 2019-12-13 NOTE — Procedures (Addendum)
[name] Name:[name] Epilepsy Attending:Cambell Stanek Barbra Sarks Referring Physician/Provider:Dr Christopher End Duration: 12/12/2019 1759 to 12/13/2019 1324  [name] s/p cardiac arrest on TTM. EEG to evaluate for seizure  Level of alertness:comatose  AEDs during EEG study:Propofol  Technical aspects: This EEG study was done with scalp electrodes positioned according to the 10-20 International system of electrode placement. Electrical activity was acquired at a sampling rate of 500Hz  and reviewed with a high frequency filter of 70Hz  and a low frequency filter of 1Hz . EEG data were recorded continuously and digitally stored.  DESCRIPTION: EEG initially showed continuous generalized 2 to 5 Hz theta-delta slowing which gradually evolved and showed mixed 5-9Hz  theta-alpha frequencies.  EEG was reactive to tactile stimulation. Hyperventilation and photic stimulation were not performed.  ABNORMALITY -continuous slow, generalized  IMPRESSION: This study issuggestive of severe encephalopathy, non specific to etiology.No seizures or epileptiform discharges were seen throughout the recording.  EEG appears stable compared to previous study.

## 2019-12-13 NOTE — Progress Notes (Signed)
Patient ID: [name], female   DOB: June 20, 1956, 64 y.o.   MRN: [id number] S: Remains on high dose pressors O:BP (!) 126/48   Pulse 74   Temp 98.6 F (37 C) (Esophageal)   Resp 12   Ht 5' 5" (1.651 m)   Wt 97.2 kg   SpO2 98%   BMI 35.66 kg/m   Intake/Output Summary (Last 24 hours) at 12/13/2019 0920 Last data filed at 12/13/2019 0800 Gross per 24 hour  Intake 2093.96 ml  Output 0 ml  Net 2093.96 ml   Intake/Output: I/O last 3 completed shifts: In: 2869.1 [I.V.:2311.7; NG/GT:460.7; IV Piggyback:96.7] Out: 0   Intake/Output this shift:  Total I/O In: 91.4 [I.V.:51.4; NG/GT:40] Out: 0  Weight change: -3.8 kg OMV:EHMCNOBSJ and unresponsive CVS: RRR no rub Resp: scattered rhonchi Abd: obese, +BS, soft Ext: no edema, LUE AVF +T/B  Recent Labs  Lab 12/23/2019 1945 12/23/2019 2145 12/12/19 0107 12/12/19 0800 12/12/19 1411 12/12/19 1944 12/12/19 2150 12/12/19 2356 12/13/19 0230 12/13/19 0338 12/13/19 0350 12/13/19 0611 12/13/19 0804 12/13/19 0806  NA 133* 134* 135 135 135 134* 136 133* 133* 133* 134* 134* 133* 132*  K 4.2 3.9 3.8 4.2 4.8 4.9 4.5 5.3* 5.3* 5.1 5.2* 5.4* 5.5* 5.3*  CL 93* 95* 96* 95* 96* 96* 100 96* 97* 95*  --   --  96*  --   CO2 _0 --   --   --   --  25  --   --  25  --   GLUCOSE 179* 154* 137* 140* 125* 101* 109* 163* 166* 145*  --   --  140*  --   BUN 24* 24* 26* 27* 29* 30* 29* 33* 36* 38*  --   --  41*  --   CREATININE 6.65* 6.62* 6.36* 7.15* 7.04* 7.20* 7.20* 7.30* 7.60* 7.44*  --   --  7.92*  --   ALBUMIN  --   --   --   --   --   --   --   --   --  2.9*  --   --   --   --   CALCIUM 7.5* 7.6* 7.7* 7.9* 7.7*  --   --   --   --  7.3*  --   --  7.6*  --   PHOS  --   --  2.3*  --   --   --   --   --   --  4.6  --   --   --   --    Liver Function Tests: Recent Labs  Lab 12/13/19 0338  ALBUMIN 2.9*   No results for input(s): LIPASE, AMYLASE in the last 168 hours. No results for input(s): AMMONIA in the last 168  hours. CBC: Recent Labs  Lab 12/10/2019 1241 12/12/19 0107 12/13/19 0338 12/13/19 0350 12/13/19 0611 12/13/19 0806  WBC 10.1 6.1 9.5  --   --   --   HGB 10.3* 9.8* 10.3* 11.6* 11.6* 10.9*  HCT 32.6* 29.6* 32.4* 34.0* 34.0* 32.0*  MCV 102.5* 96.7 99.7  --   --   --   PLT 271 198 248  --   --   --    Cardiac Enzymes: No results for input(s): CKTOTAL, CKMB, CKMBINDEX, TROPONINI in the last 168 hours. CBG: Recent Labs  Lab 12/12/19 1412 12/12/19 1540 12/12/19 1943 12/12/19 2354 12/13/19 0347  GLUCAP 122* 121* 87 164* 137*    Iron  Studies: No results for input(s): IRON, TIBC, TRANSFERRIN, FERRITIN in the last 72 hours. Studies/Results: CARDIAC CATHETERIZATION  Result Date: 12/17/2019 Conclusions: 1. 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  The distal LAD is small and diffusely diseased. 2. Severely reduced left ventricular systolic function with mid and apical akinesis and aneurysm formation. 3. Mildly elevated left ventricular filling pressure. 4. PTCA of mid LAD and D2 with reestablishment of TIMI-3 flow and ~70% residual stenosis.  Given small vessel size and heavy calcification, stent placement was not attempted.  Recommendations: 1. Post-arrest care per critical care medicine, including therapeutic hypothermia. 2. Aspirin and clopidogrel for at least 12 months, ideally longer. 3. Amiodarone infusion x 24 hours; consider transitioning to enteric amiodarone thereafter when able. 4. Initiate evidence based HF therapy as blood pressure tolerates. 5. Wean norepinephrine infusion, as BP allows. 6. Consider EP consultation to reconsider ICD placement for secondary prevention, if the patient makes a meaningful neurologic and functional recovery. [name] Bush, MD [facility]   DG CHEST PORT 1 VIEW  Result Date: 12/12/2019 CLINICAL DATA:  63 year old female status post hemodialysis catheter placement.  Positive COVID-19. EXAM: PORTABLE CHEST 1 VIEW COMPARISON:  12/06/2019 portable chest and earlier. FINDINGS: Portable AP semi upright view at 1718 hours. Endotracheal tube tip at the level the clavicles. Enteric tube courses to the abdomen, tip not included. New left IJ approach central line with catheter tip at the level of the cavoatrial junction. Stable lung volumes although decreased left lung base ventilation with obscuration of the left hemidiaphragm now. Stable cardiac size and mediastinal contours. No pneumothorax. No pulmonary edema. Calcified aortic atherosclerosis. The right lung is stable. IMPRESSION: 1. New left IJ approach central line with catheter tip at the level of the cavoatrial junction. No pneumothorax. 2.  Otherwise stable lines and tubes. 3. Decreased left lung base ventilation since yesterday compatible with collapse or consolidation. Electronically Signed   By: [name] M.D.   On: 12/12/2019 17:32   DG Chest Portable 1 View  Result Date: 12/22/2019 CLINICAL DATA:  Patient intubated.  COVID-19 positive. EXAM: PORTABLE CHEST 1 VIEW COMPARISON:  10/25/2019 FINDINGS: Endotracheal tube tip lies 1 cm above the carina. Nasal/orogastric tube passes below the diaphragm, into the stomach and below the included field of view. Lungs demonstrate prominent bronchovascular markings. Possible subtle right upper and lower lung zone infiltrate. No pleural effusion or pneumothorax. Cardiac silhouette is borderline enlarged. IMPRESSION: 1. Well-positioned endotracheal and nasogastric tubes. 2. Subtle hazy infiltrate suggested in the right lung. No evidence of pulmonary edema. Electronically Signed   By: [name] M.D.   On: 11/29/2019 12:53   EEG adult  Result Date: 12/10/2019 [name] Havens, MD     12/12/2019  6:16 PM Patient Name: [name] MRN: [id number] Epilepsy Attending: Lora [name] Referring Physician/Provider: Dr [name] End Date: 12/16/2019 Duration: 21.39 mins Patient  history: 63yo F s/p cardiac arrest on TTM. EEG to evaluate for seizure Level of alertness: comatose AEDs during EEG study: Propofol Technical aspects: This EEG study was done with scalp electrodes positioned according to the 10-20 International system of electrode placement. Electrical activity was acquired at a sampling rate of 500Hz and reviewed with a high frequency filter of 70Hz and a low frequency filter of 1Hz. EEG data were recorded continuously and digitally stored. DESCRIPTION: EEG showed generalized background suppression. EEG was not reactive to tactile stimulation. Hyperventilation and photic stimulation were  not performed. ABNORMALITY - Background suppression, generalized IMPRESSION: This study is suggestive of profound severe encephalopathy, non specific to etiology. No seizures or epileptiform discharges were seen throughout the recording. Priyanka Barbra Sarks   Overnight EEG with video  Result Date: 12/12/2019 [name] Havens, MD     12/13/2019  8:49 AM Patient Name: [name] MRN: [id number] Epilepsy Attending: Lora [name] Referring Physician/Provider: Dr [name] End Duration: 12/20/2019 1759 to 12/12/2019 1759  Patient history: 63yo F s/p cardiac arrest on TTM. EEG to evaluate for seizure  Level of alertness: comatose  AEDs during EEG study: Propofol  Technical aspects: This EEG study was done with scalp electrodes positioned according to the 10-20 International system of electrode placement. Electrical activity was acquired at a sampling rate of 500Hz and reviewed with a high frequency filter of 70Hz and a low frequency filter of 1Hz. EEG data were recorded continuously and digitally stored.  DESCRIPTION: EEG showed continuous generalized 2 to 5 Hz theta-delta slowing.  EEG was reactive to tactile stimulation. Hyperventilation and photic stimulation were not performed.  ABNORMALITY -continuous slow, generalized IMPRESSION: This study is suggestive of severe encephalopathy, non  specific to etiology. No seizures or epileptiform discharges were seen throughout the recording. EEG appears to be improving throughout the study.  [name]   ECHOCARDIOGRAM COMPLETE  Result Date: 12/12/2019   ECHOCARDIOGRAM REPORT   Patient Name:   [name] Brundidge Date of Exam: 12/12/2019 Medical Rec #:  [id number]    Height:       65.0 in Accession #:    [id number]   Weight:       222.0 lb Date of Birth:  08/06/1956   BSA:          2.07 m Patient Age:    83 years     BP:           118/56 mmHg Patient Gender: F            HR:           45 bpm. Exam Location:  Inpatient Procedure: 2D Echo, Cardiac Doppler, Color Doppler and Intracardiac            Opacification Agent Indications:    Acute Myocardial Infarction 410  History:        Patient has prior history of Echocardiogram examinations, most                 recent 10/26/2019. CHF, Previous Myocardial Infarction and CAD,                 Pulmonary HTN, Signs/Symptoms:Murmur; Risk Factors:Hypertension,                 Diabetes and Dyslipidemia. ESRD.  Sonographer:    Jonelle Sidle Dance Referring Phys: Carlton  1. Left ventricular ejection fraction, by visual estimation, is <20%. The left ventricle has normal function. There is no left ventricular hypertrophy. The LV is mildly dilated with akinesis of the mid and apical segments. No evidence of LV thrombus.  2. Elevated left atrial and left ventricular end-diastolic pressures.  3. Left ventricular diastolic parameters are consistent with Grade II diastolic dysfunction (pseudonormalization).  4. Global right ventricle has normal systolic function.The right ventricular size is normal. No increase in right ventricular wall thickness.  5. Left atrial size was moderately dilated.  6. Right atrial size was normal.  7. The mitral valve is normal in structure. Mild mitral valve regurgitation. No evidence  of mitral stenosis.  8. The tricuspid valve is normal in structure. Tricuspid valve  regurgitation is trivial.  9. The aortic valve is normal in structure. Aortic valve regurgitation is not visualized. No evidence of aortic valve sclerosis or stenosis. 10. The pulmonic valve was normal in structure. Pulmonic valve regurgitation is trivial. 11. Normal pulmonary artery systolic pressure. 12. The inferior vena cava is normal in size with <50% respiratory variability, suggesting right atrial pressure of 8 mmHg. FINDINGS  Left Ventricle: Left ventricular ejection fraction, by visual estimation, is <20%. The left ventricle has normal function. The left ventricle demonstrates global hypokinesis. The left ventricular internal cavity size was mildly dilated left ventricle. There is no left ventricular hypertrophy. Left ventricular diastolic parameters are consistent with Grade II diastolic dysfunction (pseudonormalization). Elevated left atrial and left ventricular end-diastolic pressures. Right Ventricle: The right ventricular size is normal. No increase in right ventricular wall thickness. Global RV systolic function is has normal systolic function. The tricuspid regurgitant velocity is 2.48 m/s, and with an assumed right atrial pressure  of 8 mmHg, the estimated right ventricular systolic pressure is normal at 32.6 mmHg. Left Atrium: Left atrial size was moderately dilated. Right Atrium: Right atrial size was normal in size Pericardium: There is no evidence of pericardial effusion. Mitral Valve: The mitral valve is normal in structure. There is mild thickening of the mitral valve leaflet(s). Mild mitral valve regurgitation. No evidence of mitral valve stenosis by observation. Tricuspid Valve: The tricuspid valve is normal in structure. Tricuspid valve regurgitation is trivial. Aortic Valve: The aortic valve is normal in structure. Aortic valve regurgitation is not visualized. The aortic valve is structurally normal, with no evidence of sclerosis or stenosis. Pulmonic Valve: The pulmonic valve was normal  in structure. Pulmonic valve regurgitation is trivial. Pulmonic regurgitation is trivial. Aorta: The aortic root, ascending aorta and aortic arch are all structurally normal, with no evidence of dilitation or obstruction. Venous: The inferior vena cava is normal in size with less than 50% respiratory variability, suggesting right atrial pressure of 8 mmHg. IAS/Shunts: No atrial level shunt detected by color flow Doppler. There is no evidence of a patent foramen ovale. No ventricular septal defect is seen or detected. There is no evidence of an atrial septal defect.  LEFT VENTRICLE PLAX 2D LVIDd:         5.38 cm  Diastology LVIDs:         4.29 cm  LV e' lateral:   3.15 cm/s LV PW:         1.37 cm  LV E/e' lateral: 30.8 LV IVS:        1.09 cm  LV e' medial:    2.40 cm/s LVOT diam:     2.00 cm  LV E/e' medial:  40.4 LV SV:         57 ml LV SV Index:   26.20 LVOT Area:     3.14 cm  RIGHT VENTRICLE            IVC RV Basal diam:  2.93 cm    IVC diam: 1.99 cm RV S prime:     7.58 cm/s TAPSE (M-mode): 1.8 cm LEFT ATRIUM              Index       RIGHT ATRIUM           Index LA diam:        3.60 cm  1.74 cm/m  RA Area:  16.80 cm LA Vol (A2C):   102.0 ml 49.33 ml/m RA Volume:   48.20 ml  23.31 ml/m LA Vol (A4C):   82.1 ml  39.70 ml/m LA Biplane Vol: 91.6 ml  44.30 ml/m  AORTIC VALVE LVOT Vmax:   70.90 cm/s LVOT Vmean:  42.700 cm/s LVOT VTI:    0.181 m  AORTA Ao Root diam: 3.40 cm Ao Asc diam:  2.60 cm MITRAL VALVE                         TRICUSPID VALVE MV Area (PHT): 2.14 cm              TR Peak grad:   24.6 mmHg MV PHT:        102.66 msec           TR Vmax:        248.00 cm/s MV Decel Time: 354 msec MR Peak grad:    109.4 mmHg          SHUNTS MR Mean grad:    71.0 mmHg           Systemic VTI:  0.18 m MR Vmax:         523.00 cm/s         Systemic Diam: 2.00 cm MR Vmean:        394.0 cm/s MR PISA:         0.57 cm MR PISA Eff ROA: 4 mm MR PISA Radius:  0.30 cm MV E velocity: 97.00 cm/s  103 cm/s MV A velocity:  102.00 cm/s 70.3 cm/s MV E/A ratio:  0.95        1.5  Fransico Him MD Electronically signed by Fransico Him MD Signature Date/Time: 12/12/2019/11:05:57 AM    Final    . aspirin  81 mg Per NG tube Daily  . atorvastatin  10 mg Oral QPM  . chlorhexidine gluconate (MEDLINE KIT)  15 mL Mouth Rinse BID  . Chlorhexidine Gluconate Cloth  6 each Topical Q0600  . clopidogrel  75 mg Oral QPM  . feeding supplement (PRO-STAT SUGAR FREE 64)  30 mL Per Tube BID  . feeding supplement (VITAL HIGH PROTEIN)  1,000 mL Per Tube Q24H  . heparin injection (subcutaneous)  5,000 Units Subcutaneous Q8H  . insulin aspart  3-9 Units Subcutaneous Q4H  . liver oil-zinc oxide   Topical BID  . mouth rinse  15 mL Mouth Rinse 10 times per day  . pantoprazole (PROTONIX) IV  40 mg Intravenous QHS  . sodium chloride flush  10-40 mL Intracatheter Q12H  . sodium chloride flush  3 mL Intravenous Q12H    BMET    Component Value Date/Time   NA 132 (L) 12/13/2019 0806   NA 138 04/23/2018 0000   K 5.3 (H) 12/13/2019 0806   CL 96 (L) 12/13/2019 0804   CO2 25 12/13/2019 0804   GLUCOSE 140 (H) 12/13/2019 0804   BUN 41 (H) 12/13/2019 0804   BUN 38 (A) 04/23/2018 0000   CREATININE 7.92 (H) 12/13/2019 0804   CREATININE 0.76 05/23/2011 0956   CALCIUM 7.6 (L) 12/13/2019 0804   GFRNONAA 5 (L) 12/13/2019 0804   GFRAA 6 (L) 12/13/2019 0804   CBC    Component Value Date/Time   WBC 9.5 12/13/2019 0338   RBC 3.25 (L) 12/13/2019 0338   HGB 10.9 (L) 12/13/2019 0806   HCT 32.0 (L) 12/13/2019 0806   HCT 21.3 (L) 05/01/2018 5638  PLT 248 12/13/2019 0338   MCV 99.7 12/13/2019 0338   MCH 31.7 12/13/2019 0338   MCHC 31.8 12/13/2019 0338   RDW 18.4 (H) 12/13/2019 0338   LYMPHSABS 0.8 10/29/2019 0345   MONOABS 0.7 10/29/2019 0345   EOSABS 0.0 10/29/2019 0345   BASOSABS 0.0 10/29/2019 0345    Background:  Per chart she had witnessed cardiac arrest while [facility] the [facility] and underwent CPR for ~71mn, including 1 shock. Code  STEMI called and patient taken to cath lab emergently where she underwent emergent cath showing severe 2 vessel CAD w/ CTO of ostial RCA &subacute or chronic total occlusion of mid LAD - PTCA of mid LAD &D2 w/ reestablishment of TIMI-3 flow, stent not attempted due to small vessel size and heavy calcification.   Assessment/Plan:  1. STEMI/cardiac arrest- s vessel CAD with PCI with cardiogenic shock requiring pressors. 2. AMS- EEG with severe encephalopathy.  Neuro following. 3. Hypotension- requiring pressors 4. ESRD- medically too unstable for IHD at this time.  Discussed case with Drs. [place] and [place]  Remains on high dose pressors and now with hyperkalemia.  Will start CRRT since family does not wish to pursue palliative measures. 5. Anemia of CKD stage 5.  Stable 6. Combined CHF, chronic 7. SHPTH 8. Vascular access- LUE AVF, right fem tlc s/p LIJ temp HD cath for CVVHD placed 12/12/19. 9. Disposition- pt remains critically ill and overall prognosis is poor.  JDonetta Potts MD [phone]

## 2019-12-14 DIAGNOSIS — R9431 Abnormal electrocardiogram [ECG] [EKG]: Secondary | ICD-10-CM

## 2019-12-14 DIAGNOSIS — I2510 Atherosclerotic heart disease of native coronary artery without angina pectoris: Secondary | ICD-10-CM

## 2019-12-14 DIAGNOSIS — I255 Ischemic cardiomyopathy: Secondary | ICD-10-CM

## 2019-12-14 DIAGNOSIS — E1122 Type 2 diabetes mellitus with diabetic chronic kidney disease: Secondary | ICD-10-CM

## 2019-12-14 LAB — POCT ACTIVATED CLOTTING TIME
Activated Clotting Time: 164 seconds
Activated Clotting Time: 186 seconds
Activated Clotting Time: 186 seconds
Activated Clotting Time: 186 seconds
Activated Clotting Time: 191 seconds
Activated Clotting Time: 197 seconds
Activated Clotting Time: 197 seconds
Activated Clotting Time: 197 seconds
Activated Clotting Time: 202 seconds
Activated Clotting Time: 208 seconds
Activated Clotting Time: 208 seconds
Activated Clotting Time: 213 seconds
Activated Clotting Time: 213 seconds

## 2019-12-14 LAB — RENAL FUNCTION PANEL
Albumin: 2.6 g/dL — ABNORMAL LOW (ref 3.5–5.0)
Albumin: 2.6 g/dL — ABNORMAL LOW (ref 3.5–5.0)
Anion gap: 9 (ref 5–15)
Anion gap: 7 (ref 5–15)
BUN: 30 mg/dL — ABNORMAL HIGH (ref 8–23)
BUN: 25 mg/dL — ABNORMAL HIGH (ref 8–23)
CO2: 27 mmol/L (ref 22–32)
CO2: 27 mmol/L (ref 22–32)
Calcium: 7.2 mg/dL — ABNORMAL LOW (ref 8.9–10.3)
Calcium: 7.3 mg/dL — ABNORMAL LOW (ref 8.9–10.3)
Chloride: 100 mmol/L (ref 98–111)
Chloride: 103 mmol/L (ref 98–111)
Creatinine, Ser: 4.47 mg/dL — ABNORMAL HIGH (ref 0.44–1.00)
Creatinine, Ser: 3.62 mg/dL — ABNORMAL HIGH (ref 0.44–1.00)
GFR calc Af Amer: 11 mL/min — ABNORMAL LOW (ref >60)
GFR calc Af Amer: 15 mL/min — ABNORMAL LOW (ref >60)
GFR calc non Af Amer: 10 mL/min — ABNORMAL LOW (ref >60)
GFR calc non Af Amer: 13 mL/min — ABNORMAL LOW (ref >60)
Glucose, Bld: 159 mg/dL — ABNORMAL HIGH (ref 70–99)
Glucose, Bld: 147 mg/dL — ABNORMAL HIGH (ref 70–99)
Phosphorus: 2.8 mg/dL (ref 2.5–4.6)
Phosphorus: 2.8 mg/dL (ref 2.5–4.6)
Potassium: 4.7 mmol/L (ref 3.5–5.1)
Potassium: 4.9 mmol/L (ref 3.5–5.1)
Sodium: 136 mmol/L (ref 135–145)
Sodium: 137 mmol/L (ref 135–145)

## 2019-12-14 LAB — GLUCOSE, CAPILLARY
Glucose-Capillary: 112 mg/dL — ABNORMAL HIGH (ref 70–99)
Glucose-Capillary: 113 mg/dL — ABNORMAL HIGH (ref 70–99)
Glucose-Capillary: 116 mg/dL — ABNORMAL HIGH (ref 70–99)
Glucose-Capillary: 139 mg/dL — ABNORMAL HIGH (ref 70–99)
Glucose-Capillary: 154 mg/dL — ABNORMAL HIGH (ref 70–99)
Glucose-Capillary: 165 mg/dL — ABNORMAL HIGH (ref 70–99)

## 2019-12-14 LAB — MAGNESIUM: Magnesium: 2.4 mg/dL (ref 1.7–2.4)

## 2019-12-14 LAB — APTT
aPTT: >200 seconds (ref 24–36)
aPTT: >200 seconds (ref 24–36)
aPTT: >200 seconds (ref 24–36)

## 2019-12-14 NOTE — Progress Notes (Signed)
Progress Note  Patient Name: [name] Date of Encounter: 12/14/2019  Primary Cardiologist: Hilty  Subjective   Patient remains intubated and sedated. Issues with CRRT overnight. Failed spontaneous breathing trial yesterday.  I spoke with her son [name] over the phone today. I gave an update on her condition. I described her cardiac condition at length, and I discussed that we knew she had significant heart disease prior to this admission based on her prior testing. This has not improved and has in fact worsened. Her kidneys have not been working for a [place] time, hence dialysis, and she is now on CRRT given her critical illness. She has not passed a spontaneous breathing trial, and today she is being trialed again. She has not had meaningful neurologic interaction off sedation but does have some basic reflexes. I discussed that it is usually several days after return to normal body temperature before we can make a good prognostication on neuro recovery.  He is the primary decision maker but would like to speak to his aunt today. I discussed two items: discussion on timeline (ie does he want to continue full support indefinitely, or is there a time point when he would want to discuss scaling back on interventions) and discussion of code status. I specifically asked whether, if her heart were to stop in the hospital, would he want CPR, shocks, etc again. He is uncertain and would like to think about this. He wishes for her to remain FULL CODE until he can make a decision.  Inpatient Medications    Scheduled Meds: . aspirin  81 mg Per NG tube Daily  . atorvastatin  10 mg Oral QPM  . chlorhexidine gluconate (MEDLINE KIT)  15 mL Mouth Rinse BID  . Chlorhexidine Gluconate Cloth  6 each Topical Q0600  . clopidogrel  75 mg Oral QPM  . feeding supplement (PRO-STAT SUGAR FREE 64)  30 mL Per Tube BID  . feeding supplement (VITAL HIGH PROTEIN)  1,000 mL Per Tube Q24H  . heparin injection  (subcutaneous)  5,000 Units Subcutaneous Q8H  . insulin aspart  3-9 Units Subcutaneous Q4H  . liver oil-zinc oxide   Topical BID  . mouth rinse  15 mL Mouth Rinse 10 times per day  . pantoprazole sodium  40 mg Per Tube QHS  . sodium chloride flush  10-40 mL Intracatheter Q12H  . sodium chloride flush  3 mL Intravenous Q12H   Continuous Infusions: .  prismasol BGK 4/2.5 500 mL/hr at 12/13/19 2122  .  prismasol BGK 4/2.5 300 mL/hr at 12/13/19 1115  . sodium chloride 10 mL/hr at 12/03/2019 1445  . sodium chloride 10 mL/hr at 12/14/19 0700  . fentaNYL infusion INTRAVENOUS 100 mcg/hr (12/14/19 0700)  . heparin 10,000 units/ 20 mL infusion syringe 1,400 Units/hr (12/14/19 0728)  . midazolam 2 mg/hr (12/14/19 0700)  . norepinephrine (LEVOPHED) Adult infusion 20 mcg/min (12/14/19 0700)  . prismasol BGK 4/2.5 1,500 mL/hr at 12/14/19 2423   PRN Meds: sodium chloride, acetaminophen, fentaNYL, heparin, heparin, heparin, ondansetron (ZOFRAN) IV, sodium chloride flush, sodium chloride flush   Vital Signs    Vitals:   12/14/19 0500 12/14/19 0600 12/14/19 0700 12/14/19 0848  BP: (!) 134/56 (!) 131/55 136/68 110/62  Pulse:    94  Resp: 13 15 (!) 9 15  Temp:   98.6 F (37 C)   TempSrc:   Esophageal   SpO2:    100%  Weight:      Height:  Intake/Output Summary (Last 24 hours) at 12/14/2019 1003 Last data filed at 12/14/2019 0700 Gross per 24 hour  Intake 1767.43 ml  Output 2216 ml  Net -448.57 ml   Last 3 Weights 12/14/2019 12/13/2019 11/30/2019  Weight (lbs) 222 lb 3.9 oz 214 lb 4.6 oz 222 lb 10.6 oz  Weight (kg) 100.81 kg 97.2 kg 101 kg      Telemetry    Sinus rhythm with PACs - Personally Reviewed  ECG    12/12/19 Sinus brady, prolonged QTc, PVC, resolving ST elevation - Personally Reviewed  Physical Exam   GEN: intubated and sedated on fentanyl/versed HEENT: ET tube in place CARDIAC: regular rhythm, normal S1 and S2, no rubs or gallops. No murmur. VASCULAR: arterial  line in place RESPIRATORY:  Mechanical breath sounds, intubated. Mild diffuse rhonchi ABDOMEN: Soft, non-tender, non-distended MUSCULOSKELETAL:  Ambulates independently SKIN: Warm and dry, no edema. Prior TMA bilateral feet. NEUROLOGIC:  Nonfocal. Does not withdraw to pain PSYCHIATRIC:  Not responsive   Labs    High Sensitivity Troponin:   Recent Labs  Lab 11/28/2019 1241 12/21/2019 1644 12/02/2019 1830 12/12/19 0200 12/12/19 1054  TROPONINIHS 24* 46* 56* 71* 54*      Chemistry Recent Labs  Lab 12/13/19 0338 12/13/19 0350 12/13/19 1147 12/13/19 1153 12/13/19 1550 12/14/19 0409  NA 133*  --  135 135 135 136  K 5.1  --  5.5* 5.3* 4.9 4.7  CL 95*   < > 97* 97* 100 100  CO2 25   < > 24  --  23 27  GLUCOSE 145*   < > 143* 141* 159* 159*  BUN 38*   < > 41* 38* 34* 30*  CREATININE 7.44*   < > 7.34* 7.10* 6.36* 4.47*  CALCIUM 7.3*   < > 7.4*  --  7.4* 7.2*  ALBUMIN 2.9*  --   --   --  2.8* 2.6*  GFRNONAA 5*   < > 5*  --  6* 10*  GFRAA 6*   < > 6*  --  7* 11*  ANIONGAP 13   < > 14  --  12 9   < > = values in this interval not displayed.     Hematology Recent Labs  Lab 12/06/2019 1241 12/12/19 0107 12/13/19 0338 12/13/19 0806 12/13/19 1001 12/13/19 1153  WBC 10.1 6.1 9.5  --   --   --   RBC 3.18* 3.06* 3.25*  --   --   --   HGB 10.3* 9.8* 10.3* 10.9* 11.6* 11.6*  HCT 32.6* 29.6* 32.4* 32.0* 34.0* 34.0*  MCV 102.5* 96.7 99.7  --   --   --   MCH 32.4 32.0 31.7  --   --   --   MCHC 31.6 33.1 31.8  --   --   --   RDW 18.4* 18.1* 18.4*  --   --   --   PLT 271 198 248  --   --   --     BNPNo results for input(s): BNP, PROBNP in the last 168 hours.   DDimer No results for input(s): DDIMER in the last 168 hours.   Radiology    DG CHEST PORT 1 VIEW  Result Date: 12/12/2019 CLINICAL DATA:  63 year old female status post hemodialysis catheter placement. Positive COVID-19. EXAM: PORTABLE CHEST 1 VIEW COMPARISON:  11/26/2019 portable chest and earlier. FINDINGS: Portable AP  semi upright view at 1718 hours. Endotracheal tube tip at the level the clavicles. Enteric tube courses to  the abdomen, tip not included. New left IJ approach central line with catheter tip at the level of the cavoatrial junction. Stable lung volumes although decreased left lung base ventilation with obscuration of the left hemidiaphragm now. Stable cardiac size and mediastinal contours. No pneumothorax. No pulmonary edema. Calcified aortic atherosclerosis. The right lung is stable. IMPRESSION: 1. New left IJ approach central line with catheter tip at the level of the cavoatrial junction. No pneumothorax. 2.  Otherwise stable lines and tubes. 3. Decreased left lung base ventilation since yesterday compatible with collapse or consolidation. Electronically Signed   By: Genevie Ann M.D.   On: 12/12/2019 17:32    Cardiac Studies   Eco 12/12/19  1. Left ventricular ejection fraction, by visual estimation, is <20%. The left ventricle has normal function. There is no left ventricular hypertrophy. The LV is mildly dilated with akinesis of the mid and apical segments. No evidence of LV thrombus.  2. Elevated left atrial and left ventricular end-diastolic pressures.  3. Left ventricular diastolic parameters are consistent with Grade II diastolic dysfunction (pseudonormalization).  4. Global right ventricle has normal systolic function.The right ventricular size is normal. No increase in right ventricular wall thickness.  5. Left atrial size was moderately dilated.  6. Right atrial size was normal.  7. The mitral valve is normal in structure. Mild mitral valve regurgitation. No evidence of mitral stenosis.  8. The tricuspid valve is normal in structure. Tricuspid valve regurgitation is trivial.  9. The aortic valve is normal in structure. Aortic valve regurgitation is not visualized. No evidence of aortic valve sclerosis or stenosis. 10. The pulmonic valve was normal in structure. Pulmonic valve regurgitation is  trivial. 11. Normal pulmonary artery systolic pressure. 12. The inferior vena cava is normal in size with <50% respiratory variability, suggesting right atrial pressure of 8 mmHg.  Cath 12/13/2019 FINDINGS: 1. Severe 2-vessel CAD with CTO of ostial RCA and subacute or chronic total occlusion of mid LAD. 2. Severely reduced LVEF with mid and apical akinesis. 3. Mildly elevated LVEDP. 4. PTCA of mid LAD and D2 with reestablishment of TIMI-3 flow.  Given small vessel size and heavy calcification, stent placement was not attempted.  RECOMMENDATIONS: 1. Post-arrest care per CCM. 2. ASA and clopidogrel for at least 12 months. 3. Amiodarone infusion x 24 hours. 4. Initiate evidence based HF therapy as blood pressure tolerates. 5. Wean norepinephrine infusion, as BP allows.  Patient Profile     64 y.o. female who had witnessed cardiac arrest at assisted living facility. Had immediate CPR, AED recommended shock x1, defibrillated to PEA, CPR continued. ROSC achieved, and on arrival to ER had a pulse but was unresponsive. ECG showed anterolateral and ST elevation. She was taken emergently for cardiac catheterization 12/02/2019 (findings as noted) and is receiving targeted temperature management.  Assessment & Plan    Cardiac arrest, STEMI:  Overall prognosis is guarded to poor. This has been communicated with her son, whom I spoke with via telephone today. Son wishes attempt all care at this time. He would be interested in family meeting/follow up call in the near future to discuss next steps and code status. He wishes to discuss with his aunt prior to making any decisions. -cath 12/17 with severe 2V CAD, with CTO of ostial RCA and subacute/chronic total occlusion of mLAD. S/P balloon of mLAD and D2 with re-establishment of flow (small vessel/calcification precluded stent placement) -undergoing targeted temperature management, now rewarmed -remains without purposeful movements, though is back on  sedation  currently. Does not withdraw to pain but per nursing does have gag reflex -EEG with diffuse slowing -final prognostication after full rewarming, weaning of sedation,  -received IV amiodarone given presumed VF arrest (AED recommended shock, STEMI on ECG). Has Caicedo QT interval on ECG, amiodarone stopped, no further events. Difficult decision on this. 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 On CVVH as she is too unstable for intermittent hemodialysis at this time. -anemia of chronic disease as well  Acute hypoxic respiratory failure: remains intubated, did not pass SBT yesterday. Trial of CPAP today. Appreciate pulm/CC management.  Severe ischemic cardiomyopathy, EF <20% this admission, prior 25-30% 10/26/19. -prior restrictive physiology/grade 3 diastolic dysfunction, most recent study grade 2 -reported prior history (per notes) of anterior MI 10/2014 that was late presenting, EF dropped after that episode -volume managed through dialysis -cannot start any therapeutic medications as she is still requiring IV vasopressors for hypotension, have been unable to wean off levophed -no noted LV thrombus  Recent covid PNA, though two repeat tests negative  Type II diabetes, complicated by ESRD and PAD -prior bilateral TMA amputations -blood sugars currently well controlled, do not want to overcorrect -last A1c 10/26/19 was 5.4  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 Total critical care time: 40 minutes. This time includes gathering of history, evaluation of patient's response to treatment, examination of patient, review of laboratory  and imaging studies, and coordination with consultants. Also discussed with son over the telephone.  For questions or updates, please contact [place] Please consult [email or web address] for contact info under     Signed, Buford Dresser, MD  12/14/2019, 10:03 AM

## 2019-12-14 NOTE — Progress Notes (Signed)
PT's tongue appears to be swollen this AM. Cuff leak was done and there was no airway leak noted with the deflated ETT cuff. RN was made aware.

## 2019-12-14 NOTE — Progress Notes (Addendum)
Aprox 2200, kept on getting error code w/CRRT... Delano Metz RN and Ambulance person assisted... Called the 1800 number and they suggested to go ahead and change the filter set.... Filter set changed but same error code appeared (effluent pressure pod error)  Went and changed entire system out including getting another Prismaflex/CRRT machine.... New filter setup... Got an error but after re-priming, set up was ok to initiate CRRT again around 2345.

## 2019-12-14 NOTE — Progress Notes (Signed)
Patient ID: [name], female   DOB: 01-20-1956, 63 y.o.   MRN: [id number] S: had issues with increased TMP's and had machine changed last night.  New machine ran for 8 hrs but again TMP alarmed.  Cath flushing and aspirating without issues, therapeutic on heparin.  O:BP (!) 131/55   Pulse 86   Temp 98.6 F (37 C) (Esophageal)   Resp (!) 9   Ht 5' 5" (1.651 m)   Wt 100.8 kg Comment: adjusted for large pads w/water  SpO2 99%   BMI 36.98 kg/m   Intake/Output Summary (Last 24 hours) at 12/14/2019 1004 Last data filed at 12/14/2019 0700 Gross per 24 hour  Intake 1767.43 ml  Output 2216 ml  Net -448.57 ml   Intake/Output: I/O last 3 completed shifts: In: 3147.1 [I.V.:1672.1; NG/GT:1425; IV Piggyback:50] Out: 2216 [Other:2216]  Intake/Output this shift:  No intake/output data recorded. Weight change: 3.61 kg Gen: intubated and sedated CVS: no rub Resp: occ rhonchi Abd: +BS, soft Ext: trace edema, LUE AVF +T/B, LIJ temp HD catheter  Recent Labs  Lab 12/12/19 0107 12/12/19 0431 12/12/19 1411 12/13/19 0338 12/13/19 0804 12/13/19 0806 12/13/19 0955 12/13/19 1001 12/13/19 1147 12/13/19 1153 12/13/19 1550 12/14/19 0409  NA 135  --  135 133* 133* 132* 135 133* 135 135 135 136  K 3.8  --  4.8 5.1 5.5* 5.3* 5.6* 5.8* 5.5* 5.3* 4.9 4.7  CL 96*   < > 96* 95* 96*  --  98 98 97* 97* 100 100  CO2 26   < > _0 --  25  --  24  --  23 27  GLUCOSE 137*   < > 125* 145* 140*  --  129* 128* 143* 141* 159* 159*  BUN 26*   < > 29* 38* 41*  --  42* 40* 41* 38* 34* 30*  CREATININE 6.36*   < > 7.04* 7.44* 7.92*  --  8.07* 7.70* 7.34* 7.10* 6.36* 4.47*  ALBUMIN  --   --   --  2.9*  --   --   --   --   --   --  2.8* 2.6*  CALCIUM 7.7*   < > 7.7* 7.3* 7.6*  --  7.6*  --  7.4*  --  7.4* 7.2*  PHOS 2.3*  --   --  4.6  --   --   --   --   --   --  2.8 2.8   < > = values in this interval not displayed.   Liver Function Tests: Recent Labs  Lab 12/13/19 0338 12/13/19 1550 12/14/19 0409   ALBUMIN 2.9* 2.8* 2.6*   No results for input(s): LIPASE, AMYLASE in the last 168 hours. No results for input(s): AMMONIA in the last 168 hours. CBC: Recent Labs  Lab 12/03/2019 1241 12/12/19 0107 12/13/19 0338 12/13/19 0806 12/13/19 1001 12/13/19 1153  WBC 10.1 6.1 9.5  --   --   --   HGB 10.3* 9.8* 10.3* 10.9* 11.6* 11.6*  HCT 32.6* 29.6* 32.4* 32.0* 34.0* 34.0*  MCV 102.5* 96.7 99.7  --   --   --   PLT 271 198 248  --   --   --    Cardiac Enzymes: No results for input(s): CKTOTAL, CKMB, CKMBINDEX, TROPONINI in the last 168 hours. CBG: Recent Labs  Lab 12/13/19 1554 12/13/19 2000 12/14/19 0019 12/14/19 0411 12/14/19 0741  GLUCAP 171* 108* 165* 154* 113*    Iron Studies:  No results for input(s): IRON, TIBC, TRANSFERRIN, FERRITIN in the last 72 hours. Studies/Results: DG CHEST PORT 1 VIEW  Result Date: 12/12/2019 CLINICAL DATA:  63 year old female status post hemodialysis catheter placement. Positive COVID-19. EXAM: PORTABLE CHEST 1 VIEW COMPARISON:  12/09/2019 portable chest and earlier. FINDINGS: Portable AP semi upright view at 1718 hours. Endotracheal tube tip at the level the clavicles. Enteric tube courses to the abdomen, tip not included. New left IJ approach central line with catheter tip at the level of the cavoatrial junction. Stable lung volumes although decreased left lung base ventilation with obscuration of the left hemidiaphragm now. Stable cardiac size and mediastinal contours. No pneumothorax. No pulmonary edema. Calcified aortic atherosclerosis. The right lung is stable. IMPRESSION: 1. New left IJ approach central line with catheter tip at the level of the cavoatrial junction. No pneumothorax. 2.  Otherwise stable lines and tubes. 3. Decreased left lung base ventilation since yesterday compatible with collapse or consolidation. Electronically Signed   By: Genevie Ann M.D.   On: 12/12/2019 17:32   . aspirin  81 mg Per NG tube Daily  . atorvastatin  10 mg Oral QPM   . chlorhexidine gluconate (MEDLINE KIT)  15 mL Mouth Rinse BID  . Chlorhexidine Gluconate Cloth  6 each Topical Q0600  . clopidogrel  75 mg Oral QPM  . feeding supplement (PRO-STAT SUGAR FREE 64)  30 mL Per Tube BID  . feeding supplement (VITAL HIGH PROTEIN)  1,000 mL Per Tube Q24H  . heparin injection (subcutaneous)  5,000 Units Subcutaneous Q8H  . insulin aspart  3-9 Units Subcutaneous Q4H  . liver oil-zinc oxide   Topical BID  . mouth rinse  15 mL Mouth Rinse 10 times per day  . pantoprazole sodium  40 mg Per Tube QHS  . sodium chloride flush  10-40 mL Intracatheter Q12H  . sodium chloride flush  3 mL Intravenous Q12H    BMET    Component Value Date/Time   NA 136 12/14/2019 0409   NA 138 04/23/2018 0000   K 4.7 12/14/2019 0409   CL 100 12/14/2019 0409   CO2 27 12/14/2019 0409   GLUCOSE 159 (H) 12/14/2019 0409   BUN 30 (H) 12/14/2019 0409   BUN 38 (A) 04/23/2018 0000   CREATININE 4.47 (H) 12/14/2019 0409   CREATININE 0.76 05/23/2011 0956   CALCIUM 7.2 (L) 12/14/2019 0409   GFRNONAA 10 (L) 12/14/2019 0409   GFRAA 11 (L) 12/14/2019 0409   CBC    Component Value Date/Time   WBC 9.5 12/13/2019 0338   RBC 3.25 (L) 12/13/2019 0338   HGB 11.6 (L) 12/13/2019 1153   HCT 34.0 (L) 12/13/2019 1153   HCT 21.3 (L) 05/01/2018 0223   PLT 248 12/13/2019 0338   MCV 99.7 12/13/2019 0338   MCH 31.7 12/13/2019 0338   MCHC 31.8 12/13/2019 0338   RDW 18.4 (H) 12/13/2019 0338   LYMPHSABS 0.8 10/29/2019 0345   MONOABS 0.7 10/29/2019 0345   EOSABS 0.0 10/29/2019 0345   BASOSABS 0.0 10/29/2019 0345    Background:Per chart she had witnessed cardiac arrest while doing PT at the SNF and underwent CPR for ~81mn, including 1 shock. Code STEMI called and patient taken to cath lab emergently where she underwent emergent cath showing severe 2 vessel CAD w/ CTO of ostial RCA &subacute or chronic total occlusion of mid LAD - PTCA of mid LAD &D2 w/ reestablishment of TIMI-3 flow, stent not  attempted due to small vessel size and heavy calcification.  Assessment/Plan:  1. STEMI/cardiac arrest- s vessel CAD with PCI with cardiogenic shock requiring pressors. 1. Poor overall prognosis and no interventions possible per cardiology 2. AMS- EEG with severe encephalopathy. Neuro following. 3. Hypotension- requiring pressors 4. ESRD- medically too unstable for IHD at this time. Discussed case with Drs. Alva and [place]. Remains on high dose pressors and now with hyperkalemia.   1. started CRRT on 12/13/19 since family wants aggressive medical care and does not wish to pursue palliative measures. 2. Increased TMP and had machine changed x 2.  Will decrease goal UF and see if that improves functioning.  5. Anemia of CKD stage 5. Stable 6. Combined CHF, chronic 7. SHPTH 8. Vascular access- LUE AVF, right fem tlc s/p LIJ temp HD cath for CVVHD placed 12/12/19. 9. Disposition- pt remains critically ill and overall prognosis is poor.  Agree with ongoing discussions with family regarding goals/limits of care Donetta Potts, MD [facility] [phone]

## 2019-12-14 NOTE — Progress Notes (Signed)
NAME:  [name], MRN:  [id number], DOB:  [birth year], LOS: 3 ADMISSION DATE:  12/05/2019, CONSULTATION DATE: 12/10/2019 REFERRING MD: Dr. Saunders Revel, Cardiology, CHIEF COMPLAINT: Cardiac arrest, acute respiratory failure  Brief History   63, DM, CAD with CM, COVID-19 pneumonia early November.  Admitted 12/17 from SNF after acute arrest, presumed ventricular arrhythmia.  CPR 15 minutes. Partially successful PTCI LAD 12/17, no stent.  To ICU for TTM 33.   Past Medical History   has a past medical history of Anemia, Anemia in CKD (chronic kidney disease) (12/14/2014), Benign hypertension with ESRD (end-stage renal disease) ([place]) (02/12/2018), Bilateral leg edema, Blind, Cardiac arrest ([place]) (12/12/2014), CHF (congestive heart failure) ([place]), Coronary artery disease, [place], Diabetes mellitus, ESRD on hemodialysis ([place]), Essential hypertension (08/09/2009), Fibroid uterus, Heart murmur, Hyperlipidemia, Hypertension, Leiomyoma of uterus (08/09/2009), MI (myocardial infarction) ([place]) (11/2014), Moderate tricuspid regurgitation, Osteomyelitis ([place]), Psychiatric disorder, and Pulmonary hypertension ([place]).   Significant Hospital Events   Cardiac arrest 12/17 Cardiac catheterization 12/17  12/19 -  eeg stopped. On CRRt. On fent gtt. On levophed gtt. Off Nimbex. Rewarmed early hours today. MAP Goal > 70 currently.. On vent. STarted CRRT   Consults:  Cardiology Nephrology  Procedures:  Left heart catheterization 12/17 >>   Significant Diagnostic Tests:   EEG 12/17 > profound severe encephalopathy, non specific to etiology. No seizures or epileptiform discharges  TTE 12/17 >>   Micro Data:    Antimicrobials:    Interim history/subjective:    12/14/2019 -cards communication with famly - full code. Tongue swollen per RN. Poor prognosis per cards. CRRT started yesterday due to full code.  On fent gtt, versed gtt., levophed gtt. CRRT +. On 40% fio2 on vent  Objective   Blood pressure (!) 118/52,  pulse 79, temperature 98.4 F (36.9 C), temperature source Esophageal, resp. rate 12, height 5\' 5"  (1.651 m), weight 100.8 kg, SpO2 100 %. CVP:  [11 mmHg-17 mmHg] 14 mmHg  Vent Mode: PRVC FiO2 (%):  [30 %] 30 % Set Rate:  [12 bmp] 12 bmp Vt Set:  [450 mL] 450 mL PEEP:  [5 cmH20] 5 cmH20 Pressure Support:  [10 cmH20] 10 cmH20 Plateau Pressure:  [14 cmH20-28 cmH20] 17 cmH20   Intake/Output Summary (Last 24 hours) at 12/14/2019 1434 Last data filed at 12/14/2019 1400 Gross per 24 hour  Intake 1980.58 ml  Output 2426 ml  Net -445.42 ml   Filed Weights   12/06/2019 1445 12/13/19 0515 12/14/19 0100  Weight: 101 kg 97.2 kg 100.8 kg    General Appearance:  Looks criticall ill OBESE - + Head:  Normocephalic, without obvious abnormality, atraumatic Eyes:  PERRL - yes, conjunctiva/corneas - muddy     Ears:  Normal external ear canals, both ears Nose:  G tube - no Throat:  ETT TUBE - yes , OG tube - yes Neck:  Supple,  No enlargement/tenderness/nodules Lungs: Clear to auscultation bilaterally, Ventilator   Synchrony - yes Heart:  S1 and S2 normal, no murmur, CVP - x.  Pressors - levophed Abdomen:  Soft, no masses, no organomegaly Genitalia / Rectal:  Not done Extremities:  Extremities- intact Skin:  ntact in exposed areas . Sacral area - not examined Neurologic:  Sedation - fent gtt , vered gtt -> RASS - -5       Resolved Hospital Problem list     Assessment & Plan:   Acute cardiac arrest, presumed VT/VF given her history of CAD and prior ventricular arrhythmias, shockable initial rhythm with AED. -  TTM 33C  12/14/2019 - rewarmed. On normothremia arm  Plan - per cardioology  - Coronary artery disease, acute STEMI -Post cardiac catheterization with attempted PTCI to LAD, only partially successful, no stent placed -Anticoagulation and ASA, antiplatelets post cardiac catheterization.   12/14/2019 - poor prognosis per cards notes  Plan -  Appreciate cardiology  management    Acute respiratory failure with hypoxemia due to cardiac arrest  12/14/2019 - > does not meet criteria for SBT/Extubation in setting of Acute Respiratory Failure due to cardiac arrest, shock ,sedation needs and acute encephalopathy  PLAn - Plan for ventilator management with PRVC 8 cc/kg, wean PEEP and FiO2 - No SBT until rewarmed and sedation weaned.  - VAP prevention order set  Shock, likely cardiogenic with possible component of sedating medications - 12/14/2019 - on levophed. MAP goal > 65  PLAN Levophed for MAP > 65  End-stage renal disease, chronic dialysis  12/14/2019 - on CRRT. Renal advising goals of care  Plan  - per RENA  = History of hypertension -Home antihypertensive medications on hold  Diabetes mellitus -ICU hyperglycemia protocol  Acute encephalopathy, consider toxic metabolic.  Also at risk for possible hypoxemic injury.  12/13/2019 - did follow command x1. Unable to assess 12/20 due to sedation needs  Plan  - monitor   Chronic sacral pressure ulcer present at the time of admission -WOC consult and assistance  Best practice:  Diet: TF Pain/Anxiety/Delirium protocol (if indicated): Versed, fentanyl VAP protocol (if indicated): Ordered 12/17 DVT prophylaxis: Systemic anticoagulation post catheterization GI prophylaxis: PPI Glucose control: ICU Hg protocol Mobility: BR Code Status: Full Family Communication: famiy by cards earlier 12/14/2019  Disposition: icu     ATTESTATION & SIGNATURE   The patient [name] is critically ill with multiple organ systems failure and requires high complexity decision making for assessment and support, frequent evaluation and titration of therapies, application of advanced monitoring technologies and extensive interpretation of multiple databases.   Critical Care Time devoted to patient care services described in this note is  30  Minutes. This time reflects time of care of this signee Dr  Brand Males. This critical care time does not reflect procedure time, or teaching time or supervisory time of PA/NP/Med student/Med Resident etc but could involve care discussion time     Dr. Brand Males, M.D., [facility].C.P Pulmonary and Critical Care Medicine Staff Physician [place] [facility] Pager: [phone], If no answer or between  15:00h - 7:00h: call [phone]  12/14/2019 2:36 PM    LABS    PULMONARY Recent Labs  Lab 12/12/19 0431 12/12/19 0802 12/13/19 0350 12/13/19 0611 12/13/19 0806 12/13/19 1001 12/13/19 1153  PHART 7.650* 7.535* 7.355 7.367 7.360  --   --   PCO2ART 25.5* 35.2 51.4* 47.6 50.3*  --   --   PO2ART 102.0 122.0* 84.0 75.0* 86.0  --   --   HCO3 28.8* 30.9* 29.1* 27.8 28.5*  --   --   TCO2 30 32 31 29 30 27 26   O2SAT 99.0 99.0 96.0 95.0 96.0  --   --     CBC Recent Labs  Lab 12/06/2019 1241 12/12/19 0107 12/13/19 0338 12/13/19 0806 12/13/19 1001 12/13/19 1153  HGB 10.3* 9.8* 10.3* 10.9* 11.6* 11.6*  HCT 32.6* 29.6* 32.4* 32.0* 34.0* 34.0*  WBC 10.1 6.1 9.5  --   --   --   PLT 271 198 248  --   --   --  COAGULATION Recent Labs  Lab 11/30/2019 1644 12/12/19 0058  INR 1.5* 1.2    CARDIAC  No results for input(s): TROPONINI in the last 168 hours. No results for input(s): PROBNP in the last 168 hours.   CHEMISTRY Recent Labs  Lab 12/12/19 0107 12/12/19 0431 12/13/19 0338 12/13/19 0804 12/13/19 0955 12/13/19 1001 12/13/19 1147 12/13/19 1153 12/13/19 1550 12/14/19 0409  NA 135  --  133* 133* 135 133* 135 135 135 136  K 3.8  --  5.1 5.5* 5.6* 5.8* 5.5* 5.3* 4.9 4.7  CL 96*   < > 95* 96* 98 98 97* 97* 100 100  CO2 26   < > 25 25 25   --  24  --  23 27  GLUCOSE 137*   < > 145* 140* 129* 128* 143* 141* 159* 159*  BUN 26*   < > 38* 41* 42* 40* 41* 38* 34* 30*  CREATININE 6.36*   < > 7.44* 7.92* 8.07* 7.70* 7.34* 7.10* 6.36* 4.47*  CALCIUM 7.7*   < > 7.3* 7.6* 7.6*  --  7.4*  --   7.4* 7.2*  MG 2.4  --   --   --   --   --   --   --   --  2.4  PHOS 2.3*  --  4.6  --   --   --   --   --  2.8 2.8   < > = values in this interval not displayed.   Estimated Creatinine Clearance: 15.2 mL/min (A) (by C-G formula based on SCr of 4.47 mg/dL (H)).   LIVER Recent Labs  Lab 12/08/2019 1644 12/12/19 0058 12/13/19 0338 12/13/19 1550 12/14/19 0409  ALBUMIN  --   --  2.9* 2.8* 2.6*  INR 1.5* 1.2  --   --   --      INFECTIOUS No results for input(s): LATICACIDVEN, PROCALCITON in the last 168 hours.   ENDOCRINE CBG (last 3)  Recent Labs    12/14/19 0411 12/14/19 0741 12/14/19 1133  GLUCAP 154* 113* 116*         IMAGING x48h  - image(s) personally visualized  -   highlighted in bold DG CHEST PORT 1 VIEW  Result Date: 12/12/2019 CLINICAL DATA:  63 year old female status post hemodialysis catheter placement. Positive COVID-19. EXAM: PORTABLE CHEST 1 VIEW COMPARISON:  12/03/2019 portable chest and earlier. FINDINGS: Portable AP semi upright view at 1718 hours. Endotracheal tube tip at the level the clavicles. Enteric tube courses to the abdomen, tip not included. New left IJ approach central line with catheter tip at the level of the cavoatrial junction. Stable lung volumes although decreased left lung base ventilation with obscuration of the left hemidiaphragm now. Stable cardiac size and mediastinal contours. No pneumothorax. No pulmonary edema. Calcified aortic atherosclerosis. The right lung is stable. IMPRESSION: 1. New left IJ approach central line with catheter tip at the level of the cavoatrial junction. No pneumothorax. 2.  Otherwise stable lines and tubes. 3. Decreased left lung base ventilation since yesterday compatible with collapse or consolidation. Electronically Signed   By: Genevie Ann M.D.   On: 12/12/2019 17:32

## 2019-12-14 NOTE — Progress Notes (Signed)
CRITICAL VALUE ALERT  Critical Value:  >200 PTT  Date & Time Notied:  0915 12/14/19  Provider Notified: Marval Regal, MD  Orders Received/Actions taken: Maintain ACT goal. Recheck PTT this afternoon

## 2019-12-14 NOTE — Progress Notes (Signed)
PTT remains >200. Nephrology and unit pharmacist aware of high levels. Maintaining ACT goal of 190-220. Pharmacist has D/C subq heparin.

## 2019-12-15 ENCOUNTER — Inpatient Hospital Stay (HOSPITAL_COMMUNITY): Payer: Medicare Other

## 2019-12-15 LAB — POCT ACTIVATED CLOTTING TIME
Activated Clotting Time: 224 seconds
Activated Clotting Time: 213 seconds
Activated Clotting Time: 202 seconds
Activated Clotting Time: 202 seconds
Activated Clotting Time: 213 seconds
Activated Clotting Time: 224 seconds
Activated Clotting Time: 224 seconds

## 2019-12-15 LAB — RENAL FUNCTION PANEL
Albumin: 2.5 g/dL — ABNORMAL LOW (ref 3.5–5.0)
Albumin: 2.5 g/dL — ABNORMAL LOW (ref 3.5–5.0)
Anion gap: 9 (ref 5–15)
Anion gap: 10 (ref 5–15)
BUN: 21 mg/dL (ref 8–23)
BUN: 18 mg/dL (ref 8–23)
CO2: 26 mmol/L (ref 22–32)
CO2: 25 mmol/L (ref 22–32)
Calcium: 7.5 mg/dL — ABNORMAL LOW (ref 8.9–10.3)
Calcium: 7.9 mg/dL — ABNORMAL LOW (ref 8.9–10.3)
Chloride: 102 mmol/L (ref 98–111)
Chloride: 100 mmol/L (ref 98–111)
Creatinine, Ser: 2.86 mg/dL — ABNORMAL HIGH (ref 0.44–1.00)
Creatinine, Ser: 2.89 mg/dL — ABNORMAL HIGH (ref 0.44–1.00)
GFR calc Af Amer: 19 mL/min — ABNORMAL LOW (ref >60)
GFR calc Af Amer: 19 mL/min — ABNORMAL LOW (ref >60)
GFR calc non Af Amer: 17 mL/min — ABNORMAL LOW (ref >60)
GFR calc non Af Amer: 17 mL/min — ABNORMAL LOW (ref >60)
Glucose, Bld: 125 mg/dL — ABNORMAL HIGH (ref 70–99)
Glucose, Bld: 130 mg/dL — ABNORMAL HIGH (ref 70–99)
Phosphorus: 2.6 mg/dL (ref 2.5–4.6)
Phosphorus: 2.6 mg/dL (ref 2.5–4.6)
Potassium: 4.8 mmol/L (ref 3.5–5.1)
Potassium: 5 mmol/L (ref 3.5–5.1)
Sodium: 137 mmol/L (ref 135–145)
Sodium: 135 mmol/L (ref 135–145)

## 2019-12-15 LAB — CBC WITH DIFFERENTIAL/PLATELET
Abs Immature Granulocytes: 0.05 10*3/uL (ref 0.00–0.07)
Basophils Absolute: 0.1 10*3/uL (ref 0.0–0.1)
Basophils Relative: 1 %
Eosinophils Absolute: 0.4 10*3/uL (ref 0.0–0.5)
Eosinophils Relative: 4 %
HCT: 25.9 % — ABNORMAL LOW (ref 36.0–46.0)
Hemoglobin: 8 g/dL — ABNORMAL LOW (ref 12.0–15.0)
Immature Granulocytes: 1 %
Lymphocytes Relative: 14 %
Lymphs Abs: 1.5 10*3/uL (ref 0.7–4.0)
MCH: 31.5 pg (ref 26.0–34.0)
MCHC: 30.9 g/dL (ref 30.0–36.0)
MCV: 102 fL — ABNORMAL HIGH (ref 80.0–100.0)
Monocytes Absolute: 1.5 10*3/uL — ABNORMAL HIGH (ref 0.1–1.0)
Monocytes Relative: 15 %
Neutro Abs: 6.9 10*3/uL (ref 1.7–7.7)
Neutrophils Relative %: 65 %
Platelets: 179 10*3/uL (ref 150–400)
RBC: 2.54 MIL/uL — ABNORMAL LOW (ref 3.87–5.11)
RDW: 18.9 % — ABNORMAL HIGH (ref 11.5–15.5)
WBC: 10.4 10*3/uL (ref 4.0–10.5)
nRBC: 0.2 % (ref 0.0–0.2)

## 2019-12-15 LAB — GLUCOSE, CAPILLARY
Glucose-Capillary: 120 mg/dL — ABNORMAL HIGH (ref 70–99)
Glucose-Capillary: 123 mg/dL — ABNORMAL HIGH (ref 70–99)
Glucose-Capillary: 129 mg/dL — ABNORMAL HIGH (ref 70–99)
Glucose-Capillary: 133 mg/dL — ABNORMAL HIGH (ref 70–99)

## 2019-12-15 LAB — HEPARIN LEVEL (UNFRACTIONATED): Heparin Unfractionated: 1.26 IU/mL — ABNORMAL HIGH (ref 0.30–0.70)

## 2019-12-15 LAB — APTT
aPTT: >200 seconds (ref 24–36)
aPTT: >200 seconds (ref 24–36)

## 2019-12-15 LAB — MAGNESIUM: Magnesium: 2.4 mg/dL (ref 1.7–2.4)

## 2019-12-15 NOTE — Progress Notes (Signed)
Wasted 289ml fentanyl with [employer].

## 2019-12-15 NOTE — Psychosocial Assessment (Signed)
Progress Note  Patient Name: [name] Date of Encounter: 12/15/2019  Primary Cardiologist: Dr. Al Pimple  Subjective   Patient sedated, intubated and on pressors without meaningful neurologic recovery since admission 12/08/2019.  Inpatient Medications    Scheduled Meds: . aspirin  81 mg Per NG tube Daily  . atorvastatin  10 mg Oral QPM  . chlorhexidine gluconate (MEDLINE KIT)  15 mL Mouth Rinse BID  . Chlorhexidine Gluconate Cloth  6 each Topical Q0600  . clopidogrel  75 mg Oral QPM  . feeding supplement (PRO-STAT SUGAR FREE 64)  30 mL Per Tube BID  . feeding supplement (VITAL HIGH PROTEIN)  1,000 mL Per Tube Q24H  . insulin aspart  3-9 Units Subcutaneous Q4H  . liver oil-zinc oxide   Topical BID  . mouth rinse  15 mL Mouth Rinse 10 times per day  . pantoprazole sodium  40 mg Per Tube QHS  . sodium chloride flush  10-40 mL Intracatheter Q12H  . sodium chloride flush  3 mL Intravenous Q12H   Continuous Infusions: .  prismasol BGK 4/2.5 500 mL/hr at 12/15/19 0442  .  prismasol BGK 4/2.5 300 mL/hr at 12/15/19 0313  . sodium chloride 10 mL/hr at 12/15/2019 1445  . sodium chloride 10 mL/hr at 12/15/19 0700  . fentaNYL infusion INTRAVENOUS 150 mcg/hr (12/15/19 0700)  . heparin 10,000 units/ 20 mL infusion syringe 1,350 Units/hr (12/15/19 0121)  . midazolam 3 mg/hr (12/15/19 0700)  . norepinephrine (LEVOPHED) Adult infusion 16 mcg/min (12/15/19 0700)  . prismasol BGK 4/2.5 1,500 mL/hr at 12/15/19 0440   PRN Meds: sodium chloride, acetaminophen, fentaNYL, heparin, heparin, heparin, ondansetron (ZOFRAN) IV, sodium chloride flush, sodium chloride flush   Vital Signs    Vitals:   12/15/19 0400 12/15/19 0500 12/15/19 0600 12/15/19 0700  BP: (!) 127/53 (!) 140/52 (!) 117/48 (!) 133/53  Pulse:    75  Resp: 13 (!) 8 (!) 0 12  Temp: 98.6 F (37 C) 98.4 F (36.9 C) 98.6 F (37 C) 98.6 F (37 C)  TempSrc: Esophageal Esophageal Esophageal Esophageal  SpO2: 100%    97%  Weight:  100.3 kg    Height:        Intake/Output Summary (Last 24 hours) at 12/15/2019 0806 Last data filed at 12/15/2019 0700 Gross per 24 hour  Intake 2104.44 ml  Output 1901 ml  Net 203.44 ml   Last 3 Weights 12/15/2019 12/14/2019 12/13/2019  Weight (lbs) 221 lb 2.3 oz 222 lb 3.9 oz 214 lb 4.6 oz  Weight (kg) 100.31 kg 100.81 kg 97.2 kg      Telemetry    Sinus rhythm- Personally Reviewed  ECG    Not performed today- Personally Reviewed  Physical Exam   GEN:  Intubated and sedated Neck: No JVD Cardiac: RRR, soft outflow tract murmur, rubs, or gallops.  Respiratory:  No spontaneous respiration GI: Soft, nontender, non-distended  MS: No edema; No deformity. Neuro:  Nonfocal  Psych: Normal affect   Labs    High Sensitivity Troponin:   Recent Labs  Lab 11/28/2019 1241 11/26/2019 1644 11/26/2019 1830 12/12/19 0200 12/12/19 1054  TROPONINIHS 24* 46* 56* 71* 54*      Chemistry Recent Labs  Lab 12/14/19 0409 12/14/19 1535 12/15/19 0337  NA 136 137 137  K 4.7 4.9 4.8  CL 100 103 102  CO2 27 27 26   GLUCOSE 159* 147* 125*  BUN 30* 25* 21  CREATININE 4.47* 3.62* 2.86*  CALCIUM 7.2* 7.3* 7.5*  ALBUMIN  2.6* 2.6* 2.5*  GFRNONAA 10* 13* 17*  GFRAA 11* 15* 19*  ANIONGAP 9 7 9      Hematology Recent Labs  Lab 12/12/19 0107 12/13/19 0338 12/13/19 1001 12/13/19 1153 12/15/19 0506  WBC 6.1 9.5  --   --  10.4  RBC 3.06* 3.25*  --   --  2.54*  HGB 9.8* 10.3* 11.6* 11.6* 8.0*  HCT 29.6* 32.4* 34.0* 34.0* 25.9*  MCV 96.7 99.7  --   --  102.0*  MCH 32.0 31.7  --   --  31.5  MCHC 33.1 31.8  --   --  30.9  RDW 18.1* 18.4*  --   --  18.9*  PLT 198 248  --   --  179    BNPNo results for input(s): BNP, PROBNP in the last 168 hours.   DDimer No results for input(s): DDIMER in the last 168 hours.   Radiology    DG CHEST PORT 1 VIEW  Result Date: 12/15/2019 CLINICAL DATA:  Intubation.  Respiratory failure. EXAM: PORTABLE CHEST 1 VIEW COMPARISON:   12/12/2019. FINDINGS: Endotracheal tube, NG tube, left IJ line in stable position. Cardiomegaly. No pulmonary venous congestion. Lung volumes. Mild left base subsegmental atelectasis, improved from prior exam. No pleural effusion or pneumothorax. IMPRESSION: 1.  Lines and tubes in stable position. 2.  Stable cardiomegaly. 3. Low lung volumes. Mild left base subsegmental atelectasis, improved from prior exam. Electronically Signed   By: [place]   On: 12/15/2019 07:04    Cardiac Studies   2D echocardiogram (12/12/2019)  IMPRESSIONS    1. Left ventricular ejection fraction, by visual estimation, is <20%. The left ventricle has normal function. There is no left ventricular hypertrophy. The LV is mildly dilated with akinesis of the mid and apical segments. No evidence of LV thrombus.  2. Elevated left atrial and left ventricular end-diastolic pressures.  3. Left ventricular diastolic parameters are consistent with Grade II diastolic dysfunction (pseudonormalization).  4. Global right ventricle has normal systolic function.The right ventricular size is normal. No increase in right ventricular wall thickness.  5. Left atrial size was moderately dilated.  6. Right atrial size was normal.  7. The mitral valve is normal in structure. Mild mitral valve regurgitation. No evidence of mitral stenosis.  8. The tricuspid valve is normal in structure. Tricuspid valve regurgitation is trivial.  9. The aortic valve is normal in structure. Aortic valve regurgitation is not visualized. No evidence of aortic valve sclerosis or stenosis. 10. The pulmonic valve was normal in structure. Pulmonic valve regurgitation is trivial. 11. Normal pulmonary artery systolic pressure. 12. The inferior vena cava is normal in size with <50% respiratory variability, suggesting right atrial pressure of 8 mmHg.  Cardiac catheterization (12/10/2019)  Conclusion  Conclusions: 1. Severe two-vessel CAD with chronic total  occlusion of the ostial RCA and subacute or chronic total occlusion of mid LAD.  The distal LAD is small and diffusely diseased. 2. Severely reduced left ventricular systolic function with mid and apical akinesis and aneurysm formation. 3. Mildly elevated left ventricular filling pressure. 4. PTCA of mid LAD and D2 with reestablishment of TIMI-3 flow and ~70% residual stenosis. Given small vessel size and heavy calcification, stent placement was not attempted.  Recommendations: 1. Post-arrest care per critical care medicine, including therapeutic hypothermia. 2. Aspirin and clopidogrel for at least 12 months, ideally longer. 3. Amiodarone infusion x 24 hours; consider transitioning to enteric amiodarone thereafter when able. 4. Initiate evidence based HF therapy as blood  pressure tolerates. 5. Wean norepinephrine infusion, as BP allows. 6. Consider EP consultation to reconsider ICD placement for secondary prevention, if the patient makes a meaningful neurologic and functional recovery.  [name], MD [facility]     Intervention     Patient Profile     63 y.o. female who had witnessed cardiac arrest on 12/09/2019 at assisted living facility. Had immediate CPR, AED recommended shock x1, defibrillated to PEA, CPR continued. ROSC achieved, and on arrival to ER had a pulse but was unresponsive. ECG showed anterolateral and ST elevation. She was taken emergently for cardiac catheterization 12/12/2019 (findings as noted) and is receiving targeted temperature management.  She does have a history of ischemic cardiomyopathy with an EF in the 25 to 30% range, prior anterior MI, end-stage renal disease on hemodialysis.  She is blind, deaf and mute.  She has had PAD status post bilateral TMA's.  Her son has been in communication with Dr. Harrell Gave and is the POA.  Assessment & Plan    1: CAD-history of prior anterior MI with late presentation anterior STEMI while doing PT at an assisted  care facility on 12/10/2019.  She was taken to the Cath Lab by Dr. Saunders Revel after she was intubated, sedated and placed on pressors in the emergency room. He found a chronically occluded RCA with left-to-right collaterals and an occluded LAD in the midportion with a patent circumflex.  She underwent POBA of the LAD and diagonal branch with restoration of antegrade flow although the distal LAD was a small vessel with slow flow consistent with delayed revascularization in a diseased segment.  2: Ischemic cardiomyopathy-EF less than 25% visually and by 2D echo.  She is a severe dilated cardiomyopathy.  We are unable to treat her with guideline directed medical therapy given her pressor dependence at the current time.  3: Witnessed cardiac arrest-immediate CPR and defibrillation with PEA and then ROSC.  She underwent targeted temperature management and has been rewarmed.  She was on amiodarone for VF arrest which was discontinued because of [place] QT  4: Cardiogenic shock-on norepinephrine infusion which is slowly being weaned off.  Her systolic blood pressure currently is in the 130 range  5: Poor neurologic prognosis-patient is on Versed and fentanyl IV.  She has had no spontaneous movement.  She did not withdraw to pain this morning.  EEG showed diffuse slowing.  She is now 4 days out and I suspect the likelihood of meaningful neurologic recovery is minimal.  6: Ventilator dependent respiratory failure-failed weaning attempt this morning.  Only brief for time spontaneously.  Appreciate PCCM's management of ventilator.  7: Chronic renal failure-on hemodialysis is not outpatient currently getting CRT  8: Type 2 diabetes  9: PAD-status post bilateral TMA's  10: Prior COVID-19 positive  11: DNR status-currently full code but being discussed with son is POA  Patient now day 4 witnessed cardiac arrest, CPR with delayed presentation to the Cath Lab and restoration of antegrade flow by Dr. Saunders Revel using balloon  angioplasty of diffusely diseased LAD and diagonal branch in the midportion.  She is intubated without spontaneous respiration and getting CRRT on pressors.  She has had no neurologic recovery and has diffuse slowing on her EEG.  Dr. Harrell Gave has been in discussion with the patient's son, the POA to discuss CODE STATUS.  The family is still considering their options but at this time the patient is a full code.  I do not expect meaningful neurologic recovery.   For questions  or updates, please contact [place] Please consult [email or web address] for contact info under        Signed, Quay Burow, MD  12/15/2019, 8:06 AM

## 2019-12-15 NOTE — Progress Notes (Signed)
CRITICAL VALUE ALERT  Critical Value:  PTT > 200   Date & Time Notied:  12/15/19 5:46 AM  Provider Notified: Notified CCM.Marland Kitchen. spoke w/Jodi RN  Orders Received/Actions taken: Notify nephro this am when they come in since this has been a recurrent issue. Nephro and pharmacy aware.

## 2019-12-15 NOTE — Progress Notes (Signed)
[facility] ROUNDING NOTE   Subjective:   This is a 63 year old lady history of end-stage renal disease dialysis dependent, diabetes mellitus type 2, history of congestive heart failure, hypertension, hyperlipidemia history of myocardial infarction 11/2014 with history of pulmonary hypertension and psychiatric disorder.    Admitted 11/30/2019 with witnessed cardiac arrest while doing physical therapy at SNF.  Underwent CPR for 15 minutes including 1 shock.  Code STEMI called patient taken emergently to Cath Lab that demonstrated severe two-vessel disease with CTO of ostial RCA &subacute or chronic total occlusion of mid LAD - PTCA of mid LAD &D2 w/ reestablishment of TIMI-3 flow, stent not attempted due to small vessel size and heavy calcification.  Acute respiratory failure with hypoxemia secondary to cardiac arrest continues on ventilator support.  Shock most likely cardiogenic continues on Levophed.  CRRT initiated 12/14/2019.  Acute encephalopathy unable to assess secondary to sedation needs possible risk for hypoxic brain injury.  Blood pressure 115/51 pulse 75 temperature 98.6 O2 sats 97% 30% FiO2  IV Levophed  Aspirin 81 mg daily, Lipitor 10 mg daily, Plavix 75 mg daily, insulin sliding scale, Protonix 40 mg daily  Being kept even on CRRT no fluid removed  Sodium 137 potassium 4.8 chloride 102 CO2 26 glucose 125 BUN 21 creatinine 2.86 calcium 7.5 phosphorus 2.6 magnesium 2.4  WBC 10.4 hemoglobin 8.0 platelets 179     Objective:  Vital signs in last 24 hours:  Temp:  [97.9 F (36.6 C)-99.1 F (37.3 C)] 98.6 F (37 C) (12/21 0700) Pulse Rate:  [73-91] 77 (12/21 0800) Resp:  [0-16] 16 (12/21 0800) BP: (106-144)/(48-85) 123/54 (12/21 0800) SpO2:  [97 %-100 %] 100 % (12/21 0800) Arterial Line BP: (116-168)/(43-104) 138/51 (12/21 0800) FiO2 (%):  [30 %] 30 % (12/21 0800) Weight:  [100.3 kg] 100.3 kg (12/21 0500)  Weight change: -0.5 kg Filed Weights   12/13/19 0515 12/14/19 0100 12/15/19 0500  Weight: 97.2 kg 100.8 kg 100.3 kg    Intake/Output: I/O last 3 completed shifts: In: 3206 [I.V.:1491; NG/GT:1715] Out: 3368 [Other:3368]   Intake/Output this shift:  Total I/O In: 83.1 [I.V.:38.1; NG/GT:45] Out: 101 [Other:101]  Gen: intubated and sedated CVS: no rub Resp: occ rhonchi Abd: +BS, soft Ext: trace edema, LUE AVF +T/B, LIJ temp HD catheter   Basic Metabolic Panel: Recent Labs  Lab 12/12/19 0107 12/12/19 0431 12/13/19 0338 12/13/19 0350 12/13/19 1147 12/13/19 1153 12/13/19 1550 12/14/19 0409 12/14/19 1535 12/15/19 0337  NA 135  --  133*  --  135 135 135 136 137 137  K 3.8  --  5.1  --  5.5* 5.3* 4.9 4.7 4.9 4.8  CL 96*   < > 95*   < > 97* 97* 100 100 103 102  CO2 26   < > 25   < > 24  --  23 27 27 26   GLUCOSE 137*   < > 145*   < > 143* 141* 159* 159* 147* 125*  BUN 26*   < > 38*   < > 41* 38* 34* 30* 25* 21  CREATININE 6.36*   < > 7.44*   < > 7.34* 7.10* 6.36* 4.47* 3.62* 2.86*  CALCIUM 7.7*   < > 7.3*   < > 7.4*  --  7.4* 7.2* 7.3* 7.5*  MG 2.4  --   --   --   --   --   --  2.4  --  2.4  PHOS 2.3*  --  4.6  --   --   --  2.8 2.8 2.8 2.6   < > = values in this interval not displayed.    Liver Function Tests: Recent Labs  Lab 12/13/19 0338 12/13/19 1550 12/14/19 0409 12/14/19 1535 12/15/19 0337  ALBUMIN 2.9* 2.8* 2.6* 2.6* 2.5*   No results for input(s): LIPASE, AMYLASE in the last 168 hours. No results for input(s): AMMONIA in the last 168 hours.  CBC: Recent Labs  Lab 12/02/2019 1241 12/12/19 0107 12/13/19 0338 12/13/19 0611 12/13/19 0806 12/13/19 1001 12/13/19 1153 12/15/19 0506  WBC 10.1 6.1 9.5  --   --   --   --  10.4  NEUTROABS  --   --   --   --   --   --   --  6.9  HGB 10.3* 9.8* 10.3* 11.6* 10.9* 11.6* 11.6* 8.0*  HCT 32.6* 29.6* 32.4* 34.0* 32.0* 34.0* 34.0* 25.9*  MCV 102.5* 96.7 99.7  --   --   --   --  102.0*  PLT 271 198 248  --   --   --   --  179    Cardiac Enzymes: No  results for input(s): CKTOTAL, CKMB, CKMBINDEX, TROPONINI in the last 168 hours.  BNP: Invalid input(s): POCBNP  CBG: Recent Labs  Lab 12/14/19 1133 12/14/19 1533 12/14/19 2005 12/15/19 0035 12/15/19 0335  GLUCAP 116* 112* 139* 133* 64*    Microbiology: Results for orders placed or performed during the hospital encounter of 12/19/2019  MRSA PCR Screening     Status: None   Collection Time: 12/08/2019  4:16 PM   Specimen: Nasopharyngeal  Result Value Ref Range Status   MRSA by PCR NEGATIVE NEGATIVE Final    Comment:        The GeneXpert MRSA Assay (FDA approved for NASAL specimens only), is one component of a comprehensive MRSA colonization surveillance program. It is not intended to diagnose MRSA infection nor to guide or monitor treatment for MRSA infections. Performed at [facility], [place] [address] [place], [place] [address]     Coagulation Studies: No results for input(s): LABPROT, INR in the last 72 hours.  Urinalysis: No results for input(s): COLORURINE, LABSPEC, PHURINE, GLUCOSEU, HGBUR, BILIRUBINUR, KETONESUR, PROTEINUR, UROBILINOGEN, NITRITE, LEUKOCYTESUR in the last 72 hours.  Invalid input(s): APPERANCEUR    Imaging: DG CHEST PORT 1 VIEW  Result Date: 12/15/2019 CLINICAL DATA:  Intubation.  Respiratory failure. EXAM: PORTABLE CHEST 1 VIEW COMPARISON:  12/12/2019. FINDINGS: Endotracheal tube, NG tube, left IJ line in stable position. Cardiomegaly. No pulmonary venous congestion. Lung volumes. Mild left base subsegmental atelectasis, improved from prior exam. No pleural effusion or pneumothorax. IMPRESSION: 1.  Lines and tubes in stable position. 2.  Stable cardiomegaly. 3. Low lung volumes. Mild left base subsegmental atelectasis, improved from prior exam. Electronically Signed   By: [place]   On: 12/15/2019 07:04     Medications:   .  prismasol BGK 4/2.5 500 mL/hr at 12/15/19 0442  .  prismasol BGK 4/2.5 300 mL/hr at 12/15/19 0313  .  sodium chloride 10 mL/hr at 11/26/2019 1445  . sodium chloride Stopped (12/15/19 0734)  . fentaNYL infusion INTRAVENOUS 150 mcg/hr (12/15/19 0848)  . heparin 10,000 units/ 20 mL infusion syringe 1,350 Units/hr (12/15/19 0828)  . midazolam 3 mg/hr (12/15/19 0800)  . norepinephrine (LEVOPHED) Adult infusion 14 mcg/min (12/15/19 0800)  . prismasol BGK 4/2.5 1,500 mL/hr at 12/15/19 0813   . aspirin  81 mg Per NG tube Daily  . atorvastatin  10 mg Oral QPM  .  chlorhexidine gluconate (MEDLINE KIT)  15 mL Mouth Rinse BID  . Chlorhexidine Gluconate Cloth  6 each Topical Q0600  . clopidogrel  75 mg Oral QPM  . feeding supplement (PRO-STAT SUGAR FREE 64)  30 mL Per Tube BID  . feeding supplement (VITAL HIGH PROTEIN)  1,000 mL Per Tube Q24H  . insulin aspart  3-9 Units Subcutaneous Q4H  . liver oil-zinc oxide   Topical BID  . mouth rinse  15 mL Mouth Rinse 10 times per day  . pantoprazole sodium  40 mg Per Tube QHS  . sodium chloride flush  10-40 mL Intracatheter Q12H  . sodium chloride flush  3 mL Intravenous Q12H   sodium chloride, acetaminophen, fentaNYL, heparin, heparin, heparin, ondansetron (ZOFRAN) IV, sodium chloride flush, sodium chloride flush  Assessment/ Plan:  1. STEMI/cardiac arrest- s vessel CAD with PCI with cardiogenic shock requiring pressors. 2. AMS- EEG with severe encephalopathy.  Difficult to assess due to sedation. 3. Hypotension- requiring pressors 4. ESRD- medically too unstable for IHD at this time.  CRRT started 12/13/2019.  Family wants aggressive medical care and does not wish to pursue palliative measures at this time. 5. Anemia of CKD stage 5. Stable 6. Combined CHF, chronic 7. SHPTH 8. Vascular access- LUE AVF, right fem tlcs/p LIJtemp HD cath for CVVHDplaced 12/12/19. 9. Disposition- pt remains critically ill and overall prognosis is poor.  Agree with ongoing discussions with family regarding goals/limits of care    LOS: [place] @TODAY @9 :12 AM

## 2019-12-15 NOTE — Progress Notes (Signed)
NAME:  [name], MRN:  [id number], DOB:  May 19, 1956, LOS: 4 ADMISSION DATE:  11/27/2019, CONSULTATION DATE: 12/06/2019 REFERRING MD: Dr. Saunders Revel, Cardiology, CHIEF COMPLAINT: Cardiac arrest, acute respiratory failure  Brief History   77, DM, CAD with CM, COVID-19 pneumonia early November.  Admitted 12/17 from SNF after acute arrest, presumed ventricular arrhythmia.  CPR 15 minutes. Partially successful PTCI LAD 12/17, no stent.  To ICU for TTM 33.   Past Medical History   has a past medical history of Anemia, Anemia in CKD (chronic kidney disease) (12/14/2014), Benign hypertension with ESRD (end-stage renal disease) ([place]) (02/12/2018), Bilateral leg edema, [place], Cardiac arrest ([place]) (12/12/2014), CHF (congestive heart failure) ([place]), Coronary artery disease, [place], Diabetes mellitus, ESRD on hemodialysis ([place]), Essential hypertension (08/09/2009), Fibroid uterus, Heart murmur, Hyperlipidemia, Hypertension, Leiomyoma of uterus (08/09/2009), MI (myocardial infarction) ([place]) (11/2014), Moderate tricuspid regurgitation, Osteomyelitis ([place]), Psychiatric disorder, and Pulmonary hypertension ([place]).   Significant Hospital Events   Cardiac arrest 12/17 Cardiac catheterization 12/17 12/19 -  eeg stopped. On CRRt. On fent gtt. On levophed gtt. Off Nimbex. Rewarmed early hours today. CRRT started 12/21> remains on pressors. Remains on CRRT (net even due to shock). Remains intubated and sedated.   Consults:  Cardiology Nephrology PCCM  Procedures:  Left heart catheterization 12/17 >>   Significant Diagnostic Tests:   EEG 12/17 > profound severe encephalopathy, non specific to etiology. No seizures or epileptiform discharges   TTE 12/17 >> LVEF < 20%. LV dilated with akinesis of mid and apical segments. No LV thrombus. Grade II LV diastolic dysfunction.   Micro Data:    Antimicrobials:    Interim history/subjective:   Remains intubated, sedated, critically ill on CRRT, on pressors.   NAEO     Objective   Blood pressure (!) 115/51, pulse 73, temperature 98.6 F (37 C), temperature source Esophageal, resp. rate (!) 9, height 5\' 5"  (1.651 m), weight 100.3 kg, SpO2 97 %. CVP:  [7 mmHg-16 mmHg] 7 mmHg  Vent Mode: PRVC FiO2 (%):  [30 %] 30 % Set Rate:  [12 bmp] 12 bmp Vt Set:  [450 mL] 450 mL PEEP:  [5 cmH20] 5 cmH20 Plateau Pressure:  [18 cmH20-21 cmH20] 21 cmH20   Intake/Output Summary (Last 24 hours) at 12/15/2019 1045 Last data filed at 12/15/2019 1000 Gross per 24 hour  Intake 2190.41 ml  Output 2183 ml  Net 7.41 ml   Filed Weights   12/13/19 0515 12/14/19 0100 12/15/19 0500  Weight: 97.2 kg 100.8 kg 100.3 kg    General Appearance:  Critically and chronically ill appearing adult F, intubated and sedated NAD.  HEENT: NCAT. Anicteric sclera. Protuberant tongue. ETT OGT secure. Trachea midline. L IJ trialysis catheter.  Lungs: Scattered rhonchi. Symmetrical chest expansion. Tan secretions  Heart:  RRR s1s2 no rgm   Abdomen:  Soft round ndnt + bowel sounds  Extremities:  S/p bilateral toe amputations. Dependent edema. R Femoral CVC.  Skin: Cooling pads remain on skin. Exposed skin is clean dry warm and intact  Neurologic:  L pupil 27mm R pupil 1 mm. Non-reactive, round. Does not follow commands.  Resolved Hospital Problem list     Assessment & Plan:   Acute encephalopathy -toxic metabolic. At risk for anoxic injury in setting of cardiac arrest  12/13/2019 - did follow command x1. Unable to assess 12/20 due to sedation needs -Diffuse slowing on EEG suggestive of severe encephalopathy.  P -monitor -Delirium precautions  -Wean sedation for RASS goal 0 to -1   Acute  respiratory failure with hypoxemia due to cardiac arrest -failed SBT 12/21 P - Continue vent support - Continue efforts at weaning sedation and weaning vent support  -AM CXR   S/p Cardiac Arrest -presumed VT/VF given her history of CAD and prior ventricular arrhythmias, shockable initial rhythm  with AED. - s/p 33 TTM with rewarming P - Supportive Care - no further amiodarone (discontinued for prolonged QT)   Shock -likely cardiogenic in setting of CAD, STEMI, Cardiac arrest -possible component of sedation related hypotension  P -NE for MAP goal > 65  -CRRT currently net even in this setting  CAD Acute STEMI -Post cardiac catheterization with attempted PTCI to LAD, only partially successful, no stent placed  Severe ICM -LVEF < 20% P -  Per cardiology -Anticoagulation and ASA, antiplatelets post cardiac catheterization  Hx HTN -Holding antihypertensives in setting of shock  End-stage renal disease, chronic dialysis Plan  - CRRT per nephrology  Diabetes mellitus - SSI  Chronic sacral pressure injury -present prior to admission P -WOCN   Best practice:  Diet: TF Pain/Anxiety/Delirium protocol (if indicated): Versed, fentanyl VAP protocol (if indicated): Yes DVT prophylaxis: heparin via crrt circuit  GI prophylaxis: PPI Glucose control: SSI  Mobility: BR Code Status: Full Family Communication: Pending 12/21. Son is POA. Cardiology has been engaged in [place] conversations  Disposition: ICU   LABS    PULMONARY Recent Labs  Lab 12/12/19 0431 12/12/19 0802 12/13/19 0350 12/13/19 0611 12/13/19 0806 12/13/19 1001 12/13/19 1153  PHART 7.650* 7.535* 7.355 7.367 7.360  --   --   PCO2ART 25.5* 35.2 51.4* 47.6 50.3*  --   --   PO2ART 102.0 122.0* 84.0 75.0* 86.0  --   --   HCO3 28.8* 30.9* 29.1* 27.8 28.5*  --   --   TCO2 30 32 31 29 30 27 26   O2SAT 99.0 99.0 96.0 95.0 96.0  --   --     CBC Recent Labs  Lab 12/12/19 0107 12/13/19 0338 12/13/19 1001 12/13/19 1153 12/15/19 0506  HGB 9.8* 10.3* 11.6* 11.6* 8.0*  HCT 29.6* 32.4* 34.0* 34.0* 25.9*  WBC 6.1 9.5  --   --  10.4  PLT 198 248  --   --  179    COAGULATION Recent Labs  Lab 12/12/2019 1644 12/12/19 0058  INR 1.5* 1.2    CARDIAC  No results for input(s): TROPONINI in the last 168  hours. No results for input(s): PROBNP in the last 168 hours.   CHEMISTRY Recent Labs  Lab 12/12/19 0107 12/12/19 0431 12/13/19 0338 12/13/19 0350 12/13/19 1147 12/13/19 1153 12/13/19 1550 12/14/19 0409 12/14/19 1535 12/15/19 0337  NA 135  --  133*  --  135 135 135 136 137 137  K 3.8  --  5.1  --  5.5* 5.3* 4.9 4.7 4.9 4.8  CL 96*   < > 95*   < > 97* 97* 100 100 103 102  CO2 26   < > 25   < > 24  --  23 27 27 26   GLUCOSE 137*   < > 145*   < > 143* 141* 159* 159* 147* 125*  BUN 26*   < > 38*   < > 41* 38* 34* 30* 25* 21  CREATININE 6.36*   < > 7.44*   < > 7.34* 7.10* 6.36* 4.47* 3.62* 2.86*  CALCIUM 7.7*   < > 7.3*   < > 7.4*  --  7.4* 7.2* 7.3* 7.5*  MG 2.4  --   --   --   --   --   --  2.4  --  2.4  PHOS 2.3*  --  4.6  --   --   --  2.8 2.8 2.8 2.6   < > = values in this interval not displayed.   Estimated Creatinine Clearance: 23.6 mL/min (A) (by C-G formula based on SCr of 2.86 mg/dL (H)).   LIVER Recent Labs  Lab 12/15/2019 1644 12/12/19 0058 12/13/19 0338 12/13/19 1550 12/14/19 0409 12/14/19 1535 12/15/19 0337  ALBUMIN  --   --  2.9* 2.8* 2.6* 2.6* 2.5*  INR 1.5* 1.2  --   --   --   --   --      INFECTIOUS No results for input(s): LATICACIDVEN, PROCALCITON in the last 168 hours.   ENDOCRINE CBG (last 3)  Recent Labs    12/14/19 2005 12/15/19 0035 12/15/19 0335  GLUCAP 139* 133* 123*    IMAGING x48h  - image(s) personally visualized  -   highlighted in bold DG CHEST PORT 1 VIEW  Result Date: 12/15/2019 CLINICAL DATA:  Intubation.  Respiratory failure. EXAM: PORTABLE CHEST 1 VIEW COMPARISON:  12/12/2019. FINDINGS: Endotracheal tube, NG tube, left IJ line in stable position. Cardiomegaly. No pulmonary venous congestion. Lung volumes. Mild left base subsegmental atelectasis, improved from prior exam. No pleural effusion or pneumothorax. IMPRESSION: 1.  Lines and tubes in stable position. 2.  Stable cardiomegaly. 3. Low lung volumes. Mild left base  subsegmental atelectasis, improved from prior exam. Electronically Signed   By: [place]   On: 12/15/2019 07:04     CRITICAL CARE Performed by: Cristal Generous   Total critical care time: 38 minutes  Critical care time was exclusive of separately billable procedures and treating other patients. Critical care was necessary to treat or prevent imminent or life-threatening deterioration.  Critical care was time spent personally by me on the following activities: development of treatment plan with patient and/or surrogate as well as nursing, discussions with consultants, evaluation of patient's response to treatment, examination of patient, obtaining history from patient or surrogate, ordering and performing treatments and interventions, ordering and review of laboratory studies, ordering and review of radiographic studies, pulse oximetry and re-evaluation of patient's condition.   Eliseo Gum MSN, AGACNP-BC [place] [phone] If no answer, [phone] 12/15/2019, 10:46 AM

## 2019-12-16 ENCOUNTER — Other Ambulatory Visit: Payer: Self-pay

## 2019-12-16 LAB — CBC WITH DIFFERENTIAL/PLATELET
Abs Immature Granulocytes: 0.03 10*3/uL (ref 0.00–0.07)
Basophils Absolute: 0.1 10*3/uL (ref 0.0–0.1)
Basophils Relative: 1 %
Eosinophils Absolute: 0.2 10*3/uL (ref 0.0–0.5)
Eosinophils Relative: 3 %
HCT: 23.6 % — ABNORMAL LOW (ref 36.0–46.0)
Hemoglobin: 7.1 g/dL — ABNORMAL LOW (ref 12.0–15.0)
Immature Granulocytes: 0 %
Lymphocytes Relative: 17 %
Lymphs Abs: 1.2 10*3/uL (ref 0.7–4.0)
MCH: 31 pg (ref 26.0–34.0)
MCHC: 30.1 g/dL (ref 30.0–36.0)
MCV: 103.1 fL — ABNORMAL HIGH (ref 80.0–100.0)
Monocytes Absolute: 1.1 10*3/uL — ABNORMAL HIGH (ref 0.1–1.0)
Monocytes Relative: 15 %
Neutro Abs: 4.6 10*3/uL (ref 1.7–7.7)
Neutrophils Relative %: 64 %
Platelets: 166 10*3/uL (ref 150–400)
RBC: 2.29 MIL/uL — ABNORMAL LOW (ref 3.87–5.11)
RDW: 18.6 % — ABNORMAL HIGH (ref 11.5–15.5)
WBC: 7.1 10*3/uL (ref 4.0–10.5)
nRBC: 0.3 % — ABNORMAL HIGH (ref 0.0–0.2)

## 2019-12-16 LAB — APTT: aPTT: >200 seconds (ref 24–36)

## 2019-12-16 LAB — RENAL FUNCTION PANEL
Albumin: 2.5 g/dL — ABNORMAL LOW (ref 3.5–5.0)
Albumin: 2.3 g/dL — ABNORMAL LOW (ref 3.5–5.0)
Anion gap: 10 (ref 5–15)
Anion gap: 7 (ref 5–15)
BUN: 15 mg/dL (ref 8–23)
BUN: 14 mg/dL (ref 8–23)
CO2: 27 mmol/L (ref 22–32)
CO2: 27 mmol/L (ref 22–32)
Calcium: 8.1 mg/dL — ABNORMAL LOW (ref 8.9–10.3)
Calcium: 8 mg/dL — ABNORMAL LOW (ref 8.9–10.3)
Chloride: 101 mmol/L (ref 98–111)
Chloride: 103 mmol/L (ref 98–111)
Creatinine, Ser: 2.01 mg/dL — ABNORMAL HIGH (ref 0.44–1.00)
Creatinine, Ser: 2.11 mg/dL — ABNORMAL HIGH (ref 0.44–1.00)
GFR calc Af Amer: 30 mL/min — ABNORMAL LOW (ref >60)
GFR calc Af Amer: 28 mL/min — ABNORMAL LOW (ref >60)
GFR calc non Af Amer: 26 mL/min — ABNORMAL LOW (ref >60)
GFR calc non Af Amer: 24 mL/min — ABNORMAL LOW (ref >60)
Glucose, Bld: 106 mg/dL — ABNORMAL HIGH (ref 70–99)
Glucose, Bld: 135 mg/dL — ABNORMAL HIGH (ref 70–99)
Phosphorus: 2.1 mg/dL — ABNORMAL LOW (ref 2.5–4.6)
Phosphorus: 2.2 mg/dL — ABNORMAL LOW (ref 2.5–4.6)
Potassium: 4.9 mmol/L (ref 3.5–5.1)
Potassium: 4.9 mmol/L (ref 3.5–5.1)
Sodium: 138 mmol/L (ref 135–145)
Sodium: 137 mmol/L (ref 135–145)

## 2019-12-16 LAB — POCT ACTIVATED CLOTTING TIME
Activated Clotting Time: 219 seconds
Activated Clotting Time: 224 seconds
Activated Clotting Time: 224 seconds
Activated Clotting Time: 219 seconds
Activated Clotting Time: 213 seconds
Activated Clotting Time: 219 seconds
Activated Clotting Time: 191 seconds
Activated Clotting Time: 180 seconds
Activated Clotting Time: 186 seconds

## 2019-12-16 LAB — GLUCOSE, CAPILLARY
Glucose-Capillary: 101 mg/dL — ABNORMAL HIGH (ref 70–99)
Glucose-Capillary: 116 mg/dL — ABNORMAL HIGH (ref 70–99)
Glucose-Capillary: 123 mg/dL — ABNORMAL HIGH (ref 70–99)
Glucose-Capillary: 108 mg/dL — ABNORMAL HIGH (ref 70–99)
Glucose-Capillary: 111 mg/dL — ABNORMAL HIGH (ref 70–99)
Glucose-Capillary: 113 mg/dL — ABNORMAL HIGH (ref 70–99)
Glucose-Capillary: 132 mg/dL — ABNORMAL HIGH (ref 70–99)
Glucose-Capillary: 119 mg/dL — ABNORMAL HIGH (ref 70–99)

## 2019-12-16 LAB — MAGNESIUM: Magnesium: 2.5 mg/dL — ABNORMAL HIGH (ref 1.7–2.4)

## 2019-12-16 LAB — HEPARIN LEVEL (UNFRACTIONATED): Heparin Unfractionated: 1.1 IU/mL — ABNORMAL HIGH (ref 0.30–0.70)

## 2019-12-16 MED ORDER — ATORVASTATIN CALCIUM 10 MG PO TABS
10.0000 mg | ORAL_TABLET | Freq: Every evening | ORAL | Status: DC
  Administered 2019-12-16 – 2019-12-22 (×7): 10 mg
  Filled 2019-12-16 (×7): qty 1

## 2019-12-16 MED ORDER — ACETAMINOPHEN 325 MG PO TABS
650.0000 mg | ORAL_TABLET | ORAL | Status: DC | PRN
  Administered 2019-12-20: 650 mg
  Filled 2019-12-16: qty 2

## 2019-12-16 MED ORDER — CLOPIDOGREL BISULFATE 75 MG PO TABS
75.0000 mg | ORAL_TABLET | Freq: Every evening | ORAL | Status: DC
  Administered 2019-12-16 – 2019-12-22 (×7): 75 mg
  Filled 2019-12-16 (×7): qty 1

## 2019-12-16 NOTE — Progress Notes (Signed)
[facility] ROUNDING NOTE   Subjective:   This is a 63-year-old lady history of end-stage renal disease dialysis dependent, diabetes mellitus type 2, history of congestive heart failure, hypertension, hyperlipidemia history of myocardial infarction 11/2014 with history of pulmonary hypertension and psychiatric disorder.    Admitted 11/30/2019 with witnessed cardiac arrest while doing physical therapy at SNF.  Underwent CPR for 15 minutes including 1 shock.  Code STEMI called patient taken emergently to Cath Lab that demonstrated severe two-vessel disease with CTO of ostial RCA &subacute or chronic total occlusion of mid LAD - PTCA of mid LAD &D2 w/ reestablishment of TIMI-3 flow, stent not attempted due to small vessel size and heavy calcification.  Acute respiratory failure with hypoxemia secondary to cardiac arrest continues on ventilator support.  Shock most likely cardiogenic continues on Levophed.  CRRT initiated 12/14/2019.  Acute encephalopathy unable to assess secondary to sedation needs possible risk for hypoxic brain injury.  Blood pressure 131 and 41 pulse 64 temperature 95 O2 sats 100% FiO2 30%  Sodium 138 potassium 4.9 chloride 101 CO2 23 BUN 15 creatinine 2.0 glucose 106 calcium 8.1 phosphorus 2.1 magnesium 2.5 albumin 2.5 WBC 7.1 hemoglobin 7.1 platelets 166  IV Levophed  Aspirin 81 mg daily, Lipitor 10 mg daily, Plavix 75 mg daily, insulin sliding scale, Protonix 40 mg daily  Being kept even on CRRT no fluid removed       Objective:  Vital signs in last 24 hours:  Temp:  [91.2 F (32.9 C)-95.4 F (35.2 C)] 95.4 F (35.2 C) (12/22 0900) Pulse Rate:  [50-89] 71 (12/22 0900) Resp:  [0-18] 18 (12/22 0900) BP: (99-161)/(41-82) 127/62 (12/22 0900) SpO2:  [97 %-100 %] 100 % (12/22 0900) Arterial Line BP: (109-169)/(37-55) 138/45 (12/22 0900) FiO2 (%):  [30 %] 30 % (12/22 0800) Weight:  [97.2 kg] 97.2 kg (12/22 0302)  Weight change: -3.11 kg Filed  Weights   12/14/19 0100 12/15/19 0500 12/16/19 0302  Weight: 100.8 kg 100.3 kg 97.2 kg    Intake/Output: I/O last 3 completed shifts: In: 2074.4 [I.V.:1154.4; NG/GT:920] Out: 2481 [Emesis/NG output:300; Other:2181]   Intake/Output this shift:  Total I/O In: 93.8 [I.V.:48.8; NG/GT:45] Out: 158 [Other:158]  Gen: intubated and sedated CVS: no rub Resp: occ rhonchi Abd: +BS, soft Ext: trace edema, LUE AVF +T/B, LIJ temp HD catheter   Basic Metabolic Panel: Recent Labs  Lab 12/12/19 0107 12/12/19 0431 12/14/19 0409 12/14/19 1535 12/15/19 0337 12/15/19 1559 12/16/19 0347  NA 135  --  136 137 137 135 138  K 3.8  --  4.7 4.9 4.8 5.0 4.9  CL 96*   < > 100 103 102 100 101  CO2 26   < > 27 27 26 25 27  GLUCOSE 137*   < > 159* 147* 125* 130* 106*  BUN 26*   < > 30* 25* 21 18 15  CREATININE 6.36*   < > 4.47* 3.62* 2.86* 2.89* 2.01*  CALCIUM 7.7*   < > 7.2* 7.3* 7.5* 7.9* 8.1*  MG 2.4  --  2.4  --  2.4  --  2.5*  PHOS 2.3*   < > 2.8 2.8 2.6 2.6 2.1*   < > = values in this interval not displayed.    Liver Function Tests: Recent Labs  Lab 12/14/19 0409 12/14/19 1535 12/15/19 0337 12/15/19 1559 12/16/19 0347  ALBUMIN 2.6* 2.6* 2.5* 2.5* 2.5*   No results for input(s): LIPASE, AMYLASE in the last 168 hours. No results for input(s): AMMONIA   in the last 168 hours.  CBC: Recent Labs  Lab 12/07/2019 1241 12/12/19 0107 12/13/19 0338 12/13/19 0806 12/13/19 1001 12/13/19 1153 12/15/19 0506 12/16/19 0347  WBC 10.1 6.1 9.5  --   --   --  10.4 7.1  NEUTROABS  --   --   --   --   --   --  6.9 4.6  HGB 10.3* 9.8* 10.3* 10.9* 11.6* 11.6* 8.0* 7.1*  HCT 32.6* 29.6* 32.4* 32.0* 34.0* 34.0* 25.9* 23.6*  MCV 102.5* 96.7 99.7  --   --   --  102.0* 103.1*  PLT 271 198 248  --   --   --  179 166    Cardiac Enzymes: No results for input(s): CKTOTAL, CKMB, CKMBINDEX, TROPONINI in the last 168 hours.  BNP: Invalid input(s): POCBNP  CBG: Recent Labs  Lab 12/15/19 0035  12/15/19 0335 12/15/19 1553 12/15/19 1953 12/16/19 0814  GLUCAP 133* 123* 120* 129* 111*    Microbiology: Results for orders placed or performed during the hospital encounter of 11/30/2019  MRSA PCR Screening     Status: None   Collection Time: 12/25/2019  4:16 PM   Specimen: Nasopharyngeal  Result Value Ref Range Status   MRSA by PCR NEGATIVE NEGATIVE Final    Comment:        The GeneXpert MRSA Assay (FDA approved for NASAL specimens only), is one component of a comprehensive MRSA colonization surveillance program. It is not intended to diagnose MRSA infection nor to guide or monitor treatment for MRSA infections. Performed at  [facility], [address] [place], [place] [address]     Coagulation Studies: No results for input(s): LABPROT, INR in the last 72 hours.  Urinalysis: No results for input(s): COLORURINE, LABSPEC, PHURINE, GLUCOSEU, HGBUR, BILIRUBINUR, KETONESUR, PROTEINUR, UROBILINOGEN, NITRITE, LEUKOCYTESUR in the last 72 hours.  Invalid input(s): APPERANCEUR    Imaging: DG CHEST PORT 1 VIEW  Result Date: 12/15/2019 CLINICAL DATA:  Intubation.  Respiratory failure. EXAM: PORTABLE CHEST 1 VIEW COMPARISON:  12/12/2019. FINDINGS: Endotracheal tube, NG tube, left IJ line in stable position. Cardiomegaly. No pulmonary venous congestion. Lung volumes. Mild left base subsegmental atelectasis, improved from prior exam. No pleural effusion or pneumothorax. IMPRESSION: 1.  Lines and tubes in stable position. 2.  Stable cardiomegaly. 3. Low lung volumes. Mild left base subsegmental atelectasis, improved from prior exam. Electronically Signed   By: [place]  [place]   On: 12/15/2019 07:04     Medications:   .  prismasol BGK 4/2.5 500 mL/hr at 12/16/19 0057  .  prismasol BGK 4/2.5 300 mL/hr at 12/15/19 2003  . sodium chloride 10 mL/hr at 11/30/2019 1445  . sodium chloride Stopped (12/15/19 0734)  . fentaNYL infusion INTRAVENOUS 75 mcg/hr (12/16/19 1000)  . heparin  10,000 units/ 20 mL infusion syringe 1,000 Units/hr (12/16/19 1000)  . midazolam 1 mg/hr (12/16/19 1000)  . norepinephrine (LEVOPHED) Adult infusion 6 mcg/min (12/16/19 1000)  . prismasol BGK 4/2.5 1,500 mL/hr at 12/16/19 0956   . aspirin  81 mg Per NG tube Daily  . atorvastatin  10 mg Per Tube QPM  . chlorhexidine gluconate (MEDLINE KIT)  15 mL Mouth Rinse BID  . Chlorhexidine Gluconate Cloth  6 each Topical Q0600  . clopidogrel  75 mg Per Tube QPM  . feeding supplement (PRO-STAT SUGAR FREE 64)  30 mL Per Tube BID  . feeding supplement (VITAL HIGH PROTEIN)  1,000 mL Per Tube Q24H  . insulin aspart  3-9 Units Subcutaneous Q4H  .   liver oil-zinc oxide   Topical BID  . mouth rinse  15 mL Mouth Rinse 10 times per day  . pantoprazole sodium  40 mg Per Tube QHS  . sodium chloride flush  10-40 mL Intracatheter Q12H  . sodium chloride flush  3 mL Intravenous Q12H   sodium chloride, acetaminophen, fentaNYL, heparin, heparin, heparin, ondansetron (ZOFRAN) IV, sodium chloride flush, sodium chloride flush  Assessment/ Plan:  1. STEMI/cardiac arrest- s vessel CAD with PCI with cardiogenic shock requiring pressors. 2. AMS- EEG with severe encephalopathy.  Difficult to assess due to sedation. 3. Hypotension- requiring pressors 4. ESRD- medically too unstable for IHD at this time.  CRRT started 12/13/2019.  Family wants aggressive medical care and does not wish to pursue palliative measures at this time. 5. Anemia of CKD stage 5. Stable 6. Combined CHF, chronic 7. SHPTH 8. Vascular access- LUE AVF, right fem tlcs/p LIJtemp HD cath for CVVHDplaced 12/12/19. 9. Disposition- pt remains critically ill and overall prognosis is poor.  Agree with ongoing discussions with family regarding goals/limits of care    LOS: 5  W  @TODAY@10:25 AM  

## 2019-12-16 NOTE — Progress Notes (Signed)
Progress Note  Patient Name: [name] Date of Encounter: 12/16/2019  Primary Cardiologist: Dr. Al Pimple  Subjective   Patient sedated, intubated on pressors without significant neurologic recovery since admission 12/02/2019  Inpatient Medications    Scheduled Meds: . aspirin  81 mg Per NG tube Daily  . atorvastatin  10 mg Per Tube QPM  . chlorhexidine gluconate (MEDLINE KIT)  15 mL Mouth Rinse BID  . Chlorhexidine Gluconate Cloth  6 each Topical Q0600  . clopidogrel  75 mg Per Tube QPM  . feeding supplement (PRO-STAT SUGAR FREE 64)  30 mL Per Tube BID  . feeding supplement (VITAL HIGH PROTEIN)  1,000 mL Per Tube Q24H  . insulin aspart  3-9 Units Subcutaneous Q4H  . liver oil-zinc oxide   Topical BID  . mouth rinse  15 mL Mouth Rinse 10 times per day  . pantoprazole sodium  40 mg Per Tube QHS  . sodium chloride flush  10-40 mL Intracatheter Q12H  . sodium chloride flush  3 mL Intravenous Q12H   Continuous Infusions: .  prismasol BGK 4/2.5 500 mL/hr at 12/16/19 0057  .  prismasol BGK 4/2.5 300 mL/hr at 12/15/19 2003  . sodium chloride 10 mL/hr at 12/06/2019 1445  . sodium chloride Stopped (12/15/19 0734)  . fentaNYL infusion INTRAVENOUS 100 mcg/hr (12/16/19 0800)  . heparin 10,000 units/ 20 mL infusion syringe 1,100 Units/hr (12/16/19 0104)  . midazolam 2 mg/hr (12/16/19 0800)  . norepinephrine (LEVOPHED) Adult infusion 5 mcg/min (12/16/19 0800)  . prismasol BGK 4/2.5 1,500 mL/hr at 12/16/19 0634   PRN Meds: sodium chloride, acetaminophen, fentaNYL, heparin, heparin, heparin, ondansetron (ZOFRAN) IV, sodium chloride flush, sodium chloride flush   Vital Signs    Vitals:   12/16/19 0600 12/16/19 0700 12/16/19 0756 12/16/19 0800  BP: (!) 111/55 (!) 123/56  128/82  Pulse: (!) 56 61  72  Resp: _0 Temp: (!) 93.6 F (34.2 C) (!) 94.5 F (34.7 C)  (!) 94.3 F (34.6 C)  TempSrc:    Esophageal  SpO2: 100% 100% 100% 100%  Weight:      Height:         Intake/Output Summary (Last 24 hours) at 12/16/2019 0845 Last data filed at 12/16/2019 0800 Gross per 24 hour  Intake 813.4 ml  Output 1210 ml  Net -396.6 ml   Last 3 Weights 12/16/2019 12/15/2019 12/14/2019  Weight (lbs) 214 lb 4.6 oz 221 lb 2.3 oz 222 lb 3.9 oz  Weight (kg) 97.2 kg 100.31 kg 100.81 kg      Telemetry    Sinus rhythm- Personally Reviewed  ECG    None today- Personally Reviewed  Physical Exam   GEN: No acute distress.  Intubated and sedated Neck: No JVD Cardiac: RRR, no murmurs, rubs, or gallops.  Respiratory: Clear to auscultation bilaterally. GI: Soft, nontender, non-distended  MS: No edema; No deformity. Neuro:  Nonfocal  Psych: Normal affect   Labs    High Sensitivity Troponin:   Recent Labs  Lab 11/26/2019 1241 12/16/2019 1644 12/15/2019 1830 12/12/19 0200 12/12/19 1054  TROPONINIHS 24* 46* 56* 71* 54*      Chemistry Recent Labs  Lab 12/15/19 0337 12/15/19 1559 12/16/19 0347  NA 137 135 138  K 4.8 5.0 4.9  CL 102 100 101  CO2 _1 GLUCOSE 125* 130* 106*  BUN _2 CREATININE 2.86* 2.89* 2.01*  CALCIUM 7.5* 7.9* 8.1*  ALBUMIN 2.5* 2.5* 2.5*  GFRNONAA  17* 17* 26*  GFRAA 19* 19* 30*  ANIONGAP _0 Hematology Recent Labs  Lab 12/13/19 0338 12/13/19 1153 12/15/19 0506 12/16/19 0347  WBC 9.5  --  10.4 7.1  RBC 3.25*  --  2.54* 2.29*  HGB 10.3* 11.6* 8.0* 7.1*  HCT 32.4* 34.0* 25.9* 23.6*  MCV 99.7  --  102.0* 103.1*  MCH 31.7  --  31.5 31.0  MCHC 31.8  --  30.9 30.1  RDW 18.4*  --  18.9* 18.6*  PLT 248  --  179 166    BNPNo results for input(s): BNP, PROBNP in the last 168 hours.   DDimer No results for input(s): DDIMER in the last 168 hours.   Radiology    DG CHEST PORT 1 VIEW  Result Date: 12/15/2019 CLINICAL DATA:  Intubation.  Respiratory failure. EXAM: PORTABLE CHEST 1 VIEW COMPARISON:  12/12/2019. FINDINGS: Endotracheal tube, NG tube, left IJ line in stable position. Cardiomegaly. No  pulmonary venous congestion. Lung volumes. Mild left base subsegmental atelectasis, improved from prior exam. No pleural effusion or pneumothorax. IMPRESSION: 1.  Lines and tubes in stable position. 2.  Stable cardiomegaly. 3. Low lung volumes. Mild left base subsegmental atelectasis, improved from prior exam. Electronically Signed   By: [place]   On: 12/15/2019 07:04    Cardiac Studies     2D echocardiogram (12/12/2019)  IMPRESSIONS   1. Left ventricular ejection fraction, by visual estimation, is <20%. The left ventricle has normal function. There is no left ventricular hypertrophy. The LV is mildly dilated with akinesis of the mid and apical segments. No evidence of LV thrombus. 2. Elevated left atrial and left ventricular end-diastolic pressures. 3. Left ventricular diastolic parameters are consistent with Grade II diastolic dysfunction (pseudonormalization). 4. Global right ventricle has normal systolic function.The right ventricular size is normal. No increase in right ventricular wall thickness. 5. Left atrial size was moderately dilated. 6. Right atrial size was normal. 7. The mitral valve is normal in structure. Mild mitral valve regurgitation. No evidence of mitral stenosis. 8. The tricuspid valve is normal in structure. Tricuspid valve regurgitation is trivial. 9. The aortic valve is normal in structure. Aortic valve regurgitation is not visualized. No evidence of aortic valve sclerosis or stenosis. 10. The pulmonic valve was normal in structure. Pulmonic valve regurgitation is trivial. 11. Normal pulmonary artery systolic pressure. 12. The inferior vena cava is normal in size with <50% respiratory variability, suggesting right atrial pressure of 8 mmHg.  Cardiac catheterization (12/18/2019)  Conclusion  Conclusions: 1. 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 The distal  LAD is small and diffusely diseased. 2. Severely reduced left ventricular systolic function with mid and apical akinesis and aneurysm formation. 3. Mildly elevated left ventricular filling pressure. 4. PTCA of mid LAD and D2 with reestablishment of TIMI-3 flow and ~70% residual stenosis. Given small vessel size and heavy calcification, stent placement was not attempted.  Recommendations: 1. Post-arrest care per critical care medicine, including therapeutic hypothermia. 2. Aspirin and clopidogrel for at least 12 months, ideally longer. 3. Amiodarone infusion x 24 hours; consider transitioning to enteric amiodarone thereafter when able. 4. Initiate evidence based HF therapy as blood pressure tolerates. 5. Wean norepinephrine infusion, as BP allows. 6. Consider EP consultation to reconsider ICD placement for secondary prevention, if the patient makes a meaningful neurologic and functional recovery.  [name], MD [facility]  Intervention      Patient Profile     63 y.o. female who had witnessed cardiac arrest on 11/27/2019 at assisted living facility. Had immediate CPR, AED recommended shock x1, defibrillated to PEA, CPR continued. ROSC achieved, and on arrival to ER had a pulse but was unresponsive. ECG showed anterolateral and ST elevation. She was taken emergently for cardiac catheterization 12/03/2019 (findings as noted) and is receiving targeted temperature management.  She does have a history of ischemic cardiomyopathy with an EF in the 25 to 30% range, prior anterior MI, end-stage renal disease on hemodialysis.  She is blind, deaf and mute.  She has had PAD status post bilateral TMA's.  Her son has been in communication with Dr. Harrell Gave and is the POA.  Assessment & Plan    1: CAD-history of prior anterior MI with late presentation.  She had anterior MI while doing physical therapy in an assisted care facility 12/01/2019.  She was taken to the Cath Lab  emergently but by Dr. Saunders Revel revealing a chronically occluded RCA with left-to-right collaterals and a occluded mid LAD.  She underwent balloon angioplasty of the mid LAD and diagonal branch with restoration of antegrade flow although this was a small diffusely diseased vessel with slow flow.  Her troponins have been low and flat here  2: Ischemic cardiomyopathy-EF in the 20% range by 2D echo with a dilated heart.  She has been revascularized to the extent that she can be.  No ongoing signs of ischemia  3: Witnessed cardiac arrest-immediate CPR with PEA followed by shockable rhythm with ROSC, targeted temperature management with rewarming yesterday.  4: Cardiogenic shock-on IV norepinephrine with a systolic pressure in the 5/74/9355 range  5: Neurologic status-she was on IV Versed and fentanyl.  According to the notes she is blind, deaf and mute.  EEG apparently showed diffuse slowing.  Neurology has been following  6: Ventilator dependent respiratory failure-followed by PCCM who is going to assume primary care  7: Chronic renal insufficiency-on CRRT  8: CODE STATUS-currently a full code.  Palliative care consult was placed.  For questions or updates, please contact [place] Please consult [email or web address] for contact info under        Signed, Quay Burow, MD  12/16/2019, 8:45 AM

## 2019-12-16 NOTE — Progress Notes (Addendum)
NAME:  [name], MRN:  [id number], DOB:  Dec 24, 1956, LOS: 5 ADMISSION DATE:  12/01/2019, CONSULTATION DATE: 12/17/2019 REFERRING MD: Dr. Saunders Revel, Cardiology, CHIEF COMPLAINT: Cardiac arrest, acute respiratory failure  Brief History   81, DM, CAD with CM, COVID-19 pneumonia early November.  Admitted 12/17 from SNF after acute arrest, presumed ventricular arrhythmia.  CPR 15 minutes. Partially successful PTCI LAD 12/17, no stent.  To ICU for TTM 33.   Past Medical History   has a past medical history of Anemia, Anemia in CKD (chronic kidney disease) (12/14/2014), Benign hypertension with ESRD (end-stage renal disease) ([place]) (02/12/2018), Bilateral leg edema, Blind, Cardiac arrest ([place]) (12/12/2014), CHF (congestive heart failure) ([place]), Coronary artery disease, Deaf, Diabetes mellitus, ESRD on hemodialysis ([place]), Essential hypertension (08/09/2009), Fibroid uterus, Heart murmur, Hyperlipidemia, Hypertension, Leiomyoma of uterus (08/09/2009), MI (myocardial infarction) ([place]) (11/2014), Moderate tricuspid regurgitation, Osteomyelitis ([place]), Psychiatric disorder, and Pulmonary hypertension ([place]).   Significant Hospital Events   Cardiac arrest 12/17 Cardiac catheterization 12/17 12/19 -  eeg stopped. On CRRt. On fent gtt. On levophed gtt. Off Nimbex. Rewarmed early hours today. CRRT started 12/21> remains on pressors. Remains on CRRT (net even due to shock). Remains intubated and sedated.   Consults:  Cardiology Nephrology PCCM  Procedures:  Left heart catheterization 12/17 >>   Significant Diagnostic Tests:   EEG 12/17 > profound severe encephalopathy, non specific to etiology. No seizures or epileptiform discharges   TTE 12/17 >> LVEF < 20%. LV dilated with akinesis of mid and apical segments. No LV thrombus. Grade II LV diastolic dysfunction.   Micro Data:    Antimicrobials:    Interim history/subjective:   Remains intubated.  Poor neurological recovery.  Objective   Blood  pressure 128/82, pulse 72, temperature (!) 94.3 F (34.6 C), temperature source Esophageal, resp. rate 17, height 5\' 5"  (1.651 m), weight 97.2 kg, SpO2 100 %. CVP:  [6 mmHg-10 mmHg] 6 mmHg  Vent Mode: PRVC FiO2 (%):  [30 %] 30 % Set Rate:  [12 bmp] 12 bmp Vt Set:  [450 mL] 450 mL PEEP:  [5 cmH20] 5 cmH20 Plateau Pressure:  [18 cmH20-24 cmH20] 24 cmH20   Intake/Output Summary (Last 24 hours) at 12/16/2019 0819 Last data filed at 12/16/2019 0800 Gross per 24 hour  Intake 813.4 ml  Output 1210 ml  Net -396.6 ml   Filed Weights   12/14/19 0100 12/15/19 0500 12/16/19 0302  Weight: 100.8 kg 100.3 kg 97.2 kg    General: Appears older than stated age of 24. HEENT: Endotracheal tube gastric tube are in place. Neuro: Currently on IV sedation fentanyl Versed but when touched or spoken to startles but does not follow commands some question whether not she has a hearing deficit CV: Heart sounds are irregular PULM: Coarse rhonchi bilaterally Vent pressure regulated volume control  FIO2 30% PEEP 5 RATE 12+4 VT 450 cc plateau pressure of 18 cm of water  GI: soft, bsx4 active  GU: Extremities: warm/dry,  edema  Skin: no rashes or lesions   Resolved Hospital Problem list     Assessment & Plan:   Acute encephalopathy -toxic metabolic. At risk for anoxic injury in setting of cardiac arrest  12/13/2019 - did follow command x1. Unable to assess 12/20 due to sedation needs -Diffuse slowing on EEG suggestive of severe encephalopathy.  This was done on 12/13/2019 She is noted to be blind, deaf and mute P Continue to monitor Wean sedation as able Do the fact she is deaf mute blind consider  neurological consult to help establish neurological status. Palliative care consult 12/16/2019  Acute respiratory failure with hypoxemia due to cardiac arrest -failed SBT 12/21 P Wean per protocol Serial chest x-rays  S/p Cardiac Arrest -presumed VT/VF given her history of CAD and prior  ventricular arrhythmias, shockable initial rhythm with AED. - s/p 33 TTM with rewarming P Continue supportive care   Shock -likely cardiogenic in setting of CAD, STEMI, Cardiac arrest -possible component of sedation related hypotension  P Continue norepinephrine for mean arterial pressure goals are greater than 65   CAD Acute STEMI -Post cardiac catheterization with attempted PTCI to LAD, only partially successful, no stent placed  Severe ICM -LVEF < 20% P Per cardiology Poor cardiological recovery per cardiology Palliative care consult has been called 12/14/2019 Ongoing discussion concerning goals of care.   Hx HTN Holding antihypertensives in the setting of shock  End-stage renal disease, chronic dialysis Plan Continue CRRT per nephrology  Diabetes mellitus CBG (last 3)  Recent Labs    12/15/19 1553 12/15/19 1953 12/16/19 0814  GLUCAP 120* 129* 111*    Continue sliding scale insulin protocol adequate coverage noted  Chronic sacral pressure injury -present prior to admission P Continue care as dictated by wound ostomy care   Best practice:  Diet: TF Pain/Anxiety/Delirium protocol (if indicated): Versed, fentanyl VAP protocol (if indicated): Yes DVT prophylaxis: heparin via crrt circuit  GI prophylaxis: PPI Glucose control: SSI  Mobility: BR Code Status: Full Family Communication: Per cardiology.  Poor neurological recovery is suspected. Disposition: ICU   LABS    PULMONARY Recent Labs  Lab 12/12/19 0431 12/12/19 0802 12/13/19 0350 12/13/19 0611 12/13/19 0806 12/13/19 1001 12/13/19 1153  PHART 7.650* 7.535* 7.355 7.367 7.360  --   --   PCO2ART 25.5* 35.2 51.4* 47.6 50.3*  --   --   PO2ART 102.0 122.0* 84.0 75.0* 86.0  --   --   HCO3 28.8* 30.9* 29.1* 27.8 28.5*  --   --   TCO2 30 32 31 29 30 27 26   O2SAT 99.0 99.0 96.0 95.0 96.0  --   --     CBC Recent Labs  Lab 12/13/19 0338 12/13/19 1153 12/15/19 0506 12/16/19 0347  HGB  10.3* 11.6* 8.0* 7.1*  HCT 32.4* 34.0* 25.9* 23.6*  WBC 9.5  --  10.4 7.1  PLT 248  --  179 166    COAGULATION Recent Labs  Lab 12/06/2019 1644 12/12/19 0058  INR 1.5* 1.2    CARDIAC  No results for input(s): TROPONINI in the last 168 hours. No results for input(s): PROBNP in the last 168 hours.   CHEMISTRY Recent Labs  Lab 12/12/19 0107 12/12/19 0431 12/14/19 0409 12/14/19 1535 12/15/19 0337 12/15/19 1559 12/16/19 0347  NA 135  --  136 137 137 135 138  K 3.8  --  4.7 4.9 4.8 5.0 4.9  CL 96*   < > 100 103 102 100 101  CO2 26   < > 27 27 26 25 27   GLUCOSE 137*   < > 159* 147* 125* 130* 106*  BUN 26*   < > 30* 25* 21 18 15   CREATININE 6.36*   < > 4.47* 3.62* 2.86* 2.89* 2.01*  CALCIUM 7.7*   < > 7.2* 7.3* 7.5* 7.9* 8.1*  MG 2.4  --  2.4  --  2.4  --  2.5*  PHOS 2.3*   < > 2.8 2.8 2.6 2.6 2.1*   < > = values in this interval not displayed.  Estimated Creatinine Clearance: 33.1 mL/min (A) (by C-G formula based on SCr of 2.01 mg/dL (H)).   LIVER Recent Labs  Lab 12/08/2019 1644 12/12/19 0058 12/14/19 0409 12/14/19 1535 12/15/19 0337 12/15/19 1559 12/16/19 0347  ALBUMIN  --   --  2.6* 2.6* 2.5* 2.5* 2.5*  INR 1.5* 1.2  --   --   --   --   --      INFECTIOUS No results for input(s): LATICACIDVEN, PROCALCITON in the last 168 hours.   ENDOCRINE CBG (last 3)  Recent Labs    12/15/19 1553 12/15/19 1953 12/16/19 0814  GLUCAP 120* 129* 111*    IMAGING x48h  - image(s) personally visualized  -   highlighted in bold DG CHEST PORT 1 VIEW  Result Date: 12/15/2019 CLINICAL DATA:  Intubation.  Respiratory failure. EXAM: PORTABLE CHEST 1 VIEW COMPARISON:  12/12/2019. FINDINGS: Endotracheal tube, NG tube, left IJ line in stable position. Cardiomegaly. No pulmonary venous congestion. Lung volumes. Mild left base subsegmental atelectasis, improved from prior exam. No pleural effusion or pneumothorax. IMPRESSION: 1.  Lines and tubes in stable position. 2.  Stable  cardiomegaly. 3. Low lung volumes. Mild left base subsegmental atelectasis, improved from prior exam. Electronically Signed   By: [place]   On: 12/15/2019 07:04     App cct [place] ACNP Acute Care Nurse Practitioner [place] Please consult Amion 12/16/2019, 8:19 AM

## 2019-12-16 NOTE — Consult Note (Signed)
Consultation Note Date: 12/16/2019   Patient Name: [name]  DOB: [birth year]  MRN: [id number]  Age / Sex: 63 y.o., female   PCP: Patient, No Pcp Per Referring Physician: Audria Nine, DO   REASON FOR CONSULTATION:Establishing goals of care  Palliative Care consult requested for goals of care in this 64 y.o. female with multiple medical problems including end-stage renal disease dialysis dependent, diabetes mellitus type 2, congestive heart failure, hypertension, hyperlipidemia, myocardial infarction (11/2014), pulmonary hypertension, blind, deaf, mute, and psychiatric disorder. Patient also was hospitalized and tested positive for COVID-19 in November 2020. She presented from SNF after suffering a witnessed cardiac arrest while doing physical therapy. CPR for 15 minutes including 1 shock was performed. Patient was taken emergently to Cath Lab for Code STEMI. Continues on ventilator support requiring Levophed. She is being followed by Nephrology and currently receiving CRRT as noted she is to unstable for IHD.    Clinical Assessment and Goals of Care: I have reviewed medical records including lab results, imaging, Epic notes, and MAR, received report from the bedside RN, and assessed the patient. I spoke with patient's son/POA Otilio Miu via phone to discuss diagnosis prognosis, [place], EOL wishes, disposition and options. Patient remains intubated and sedated. Of note she is deaf, mute, and blind. No response to touch.   Patient and family familiar to Palliative team as we were involved in patient's care on previous admissions. I re-introduced Palliative Medicine as specialized medical care for people living with serious illness to Mr. [place]le. He verbalized remembrance of our involvement and interactions on previous admissions. He is appreciative of our support during this admission.    [name] reports patient has been a resident at [facility]. He shares his struggles and frustrations of  caring for his mother over the years. 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   We discussed Her current illness and what it means in the larger context of her Her on-going co-morbidities. With specific discussions regarding Ms. Ivie's overall cardiac, neurological, renal, and functional decline. Natural disease trajectory and expectations at EOL were discussed.  Maurice tearful in discussion. Therapeutic listening and support provided. He reports he knows what decisions he has to make. He is trying to update family so that they will all be on the same page and not feel left out or surprised when he makes final decisions. He shares they are a close family with some distance of certain relatives. He reports his mother is special to a lot of people and knows this will not end well, which is why he has to make sure everyone of importance is aware of her condition, decisions that are to be made (code status and comfort), and the likelihood patient will not survive.   [name] is tearful expressing he knows what his decision is as far as letting his mother rest in peace, however it is hard and stressful. He shares despite her health and history she is his mom, he wants what is best for her and to not see her in this state, but also does not want to make a hasty decision and have family or himself regret or question "after the fact!" He also has to make sure things are in order at home.   I attempted to elicit values and goals of care important to the patient.    We speak to patient's quality of life and knowing he is her advocator. He verbalized  understanding and appreciation of support.   We discuss patient's poor prognosis and full code status with consideration to patient's current illness. We discussed the criticalness and high possibility of further decline and or death despite continued aggressive medical interventions given the criticalness of patient. [name]  verbalizes his awareness of patient's critical illness and poor prognosis. He knows what decisions are to be made and knows what he wants (for her not to suffer) however, he has to be at peace with family being aware of the situation and hopefully gain their support.   He reports he has not been to visit as he does not like seeing her the way she is. He is planning to come up tomorrow and visit tomorrow afternoon and allow himself comfort, closure, and peace with his planned decisions. He states he plans to call me when he knows what time he will be at the bedside, with hopes of continued support. Support and acknowledgment of his request provided.   Questions and concerns were addressed. Mr. Ancona was encouraged to call with questions or concerns.  PMT will continue to support holistically.   SOCIAL HISTORY:     reports that she has never smoked. She has never used smokeless tobacco. She reports that she does not drink alcohol or use drugs.  CODE STATUS: Full code  ADVANCE DIRECTIVES: Otilio Miu (son/POA)   SYMPTOM MANAGEMENT: per medical team   Palliative Prophylaxis:   Aspiration, Eye Care, Frequent Pain Assessment, Oral Care and Turn Reposition  PSYCHO-SOCIAL/SPIRITUAL:  Support System: Family   Desire for further Chaplaincy support: No   Additional Recommendations (Limitations, Scope, Preferences):  Full Scope Treatment while son is discussing with family and making final decisions.    PAST MEDICAL HISTORY: Past Medical History:  Diagnosis Date   Anemia    Due to fibroids.   Anemia in CKD (chronic kidney disease) 12/14/2014   Benign hypertension with ESRD (end-stage renal disease) ([place]) 02/12/2018   Bilateral leg edema    a. Chronic.   Blind    Cardiac arrest ([place]) 12/12/2014   CHF (congestive heart failure) ([place])    Coronary artery disease    Deaf    USE SIGN INTERPRETER.  Divorced from husband but lives with him. Has daughter but she does not care for her.    Diabetes mellitus    a. Per PCP note 2012 (A1C 9.2) - pt unwilling to take meds and was educated on risk of uncontrolled DM.  b. A1C 5.9 in 11/2013.   ESRD on hemodialysis ([place])    Mon, Wed, Fri   Essential hypertension 08/09/2009   Qualifier: Diagnosis of  By: Sarita Haver  MD, Coralyn Mark     Fibroid uterus    Heart murmur    Hyperlipidemia    Hypertension    a. Has previously refused blood pressure meds.    Leiomyoma of uterus 08/09/2009   Qualifier: Diagnosis of  By: Sarita Haver  MD, Coralyn Mark     MI (myocardial infarction) ([place]) 11/2014   Moderate tricuspid regurgitation    Osteomyelitis ([place])    Psychiatric disorder    She frequently exhibits paranoia and has been diagnosed with psychotic d/o NOS during hospital stay in the past. She is tangetial and perseverative during her visits. She apparently has had a bad experience with mental health in [place] in the past and refuses to discuss mental issues for fear that she will be sent back there. Her paranoia, communication issues, financial woes and lack of fam   Pulmonary hypertension (  [place])     PAST SURGICAL HISTORY:  Past Surgical History:  Procedure Laterality Date   AMPUTATION Bilateral 04/02/2015   Procedure: Amputation Bilateral Great Toes MTP Joint;  Surgeon: Newt Minion, MD;  Location: [place];  Service: Orthopedics;  Laterality: Bilateral;   AMPUTATION Left 05/14/2015   Procedure: Left Transmetatarsal Amputation;  Surgeon: Newt Minion, MD;  Location: [place];  Service: Orthopedics;  Laterality: Left;   AMPUTATION Right 08/20/2015   Procedure: Transmetatarsal Amputation Right Foot;  Surgeon: Newt Minion, MD;  Location: [place];  Service: Orthopedics;  Laterality: Right;   AV FISTULA PLACEMENT Left 01/02/2014   Procedure: ARTERIOVENOUS (AV) FISTULA CREATION;  Surgeon: Rosetta Posner, MD;  Location: [place];  Service: Vascular;  Laterality: Left;   CORONARY/GRAFT ACUTE MI REVASCULARIZATION N/A 12/04/2019   Procedure: CORONARY/GRAFT ACUTE  MI REVASCULARIZATION;  Surgeon: [name], MD;  Location: [place] [facility];  Service: Cardiovascular;  Laterality: N/A;   INSERTION OF DIALYSIS CATHETER Right 01/02/2014   Procedure: INSERTION OF DIALYSIS CATHETER;  Surgeon: Rosetta Posner, MD;  Location: [place];  Service: Vascular;  Laterality: Right;   LEFT HEART CATH AND CORONARY ANGIOGRAPHY N/A 12/09/2019   Procedure: LEFT HEART CATH AND CORONARY ANGIOGRAPHY;  Surgeon: [name], MD;  Location: [place] [facility];  Service: Cardiovascular;  Laterality: N/A;   NO PAST SURGERIES     ORIF PATELLA Left 05/17/2015   Procedure: OPEN REDUCTION INTERNAL (ORIF) FIXATION PATELLA;  Surgeon: Newt Minion, MD;  Location: [place];  Service: Orthopedics;  Laterality: Left;    ALLERGIES:  has No Known Allergies.   MEDICATIONS:  Current Facility-Administered Medications  Medication Dose Route Frequency Provider Last Rate Last Admin    prismasol BGK 4/2.5 infusion   CRRT Continuous Donato Heinz, MD 500 mL/hr at 12/16/19 1104 New Bag at 12/16/19 1104    prismasol BGK 4/2.5 infusion   CRRT Continuous Donato Heinz, MD 300 mL/hr at 12/15/19 2003 New Bag at 12/15/19 2003   0.9 %  sodium chloride infusion   Intravenous Continuous End, Harrell Gave, MD 10 mL/hr at 12/02/2019 1445 New Bag at 11/29/2019 1445   0.9 %  sodium chloride infusion  250 mL Intravenous PRN End, Harrell Gave, MD   Stopped at 12/15/19 0734   acetaminophen (TYLENOL) tablet 650 mg  650 mg Per Tube Q4H PRN [name], [facility]       aspirin chewable tablet 81 mg  81 mg Per NG tube Daily End, Harrell Gave, MD   81 mg at 12/16/19 0902   atorvastatin (LIPITOR) tablet 10 mg  10 mg Per Tube QPM [name], RPH       chlorhexidine gluconate (MEDLINE KIT) (PERIDEX) 0.12 % solution 15 mL  15 mL Mouth Rinse BID End, Harrell Gave, MD   15 mL at 12/16/19 0725   Chlorhexidine Gluconate Cloth 2 % PADS 6 each  6 each Topical Q0600 End, Christopher, MD   6 each at 12/16/19  0429   clopidogrel (PLAVIX) tablet 75 mg  75 mg Per Tube QPM [name], RPH       feeding supplement (PRO-STAT SUGAR FREE 64) liquid 30 mL  30 mL Per Tube BID Corey Harold, NP   30 mL at 12/16/19 0903   feeding supplement (VITAL HIGH PROTEIN) liquid 1,000 mL  1,000 mL Per Tube Q24H Corey Harold, NP   1,000 mL at 12/16/19 1101   fentaNYL (SUBLIMAZE) bolus via infusion 25 mcg  25 mcg Intravenous  Q30 min PRN End, Harrell Gave, MD   25 mcg at 12/15/19 2200   fentaNYL 2546mg in NS 2534m(1060mml) infusion-PREMIX  0-75 mcg/hr Intravenous Continuous MarAudria NineO 7.5 mL/hr at 12/16/19 1706 75 mcg/hr at 12/16/19 1706   heparin 10,000 units/ 20 mL infusion syringe  250-1,500 Units/hr CRRT Continuous WebEdrick OhD 2 mL/hr at 12/16/19 1000 1,000 Units/hr at 12/16/19 1000   heparin bolus via infusion syringe 1,000 Units  1,000 Units CRRT PRN ColDonato HeinzD   1,000 Units at 12/13/19 1317   heparin injection 1,000-6,000 Units  1,000-6,000 Units CRRT PRN ColDonato HeinzD   3,000 Units at 12/14/19 0815   heparinized saline (2000 units/L) primer fluid for CRRT   CRRT PRN ColDonato HeinzD   Given at 12/14/19 0815   insulin aspart (novoLOG) injection 3-9 Units  3-9 Units Subcutaneous Q4H End, Christopher, MD   3 Units at 12/16/19 1621   liver oil-zinc oxide (DESITIN) 40 % ointment   Topical BID End, ChrHarrell GaveD   Given at 12/16/19 [id number] mouth rinse  15 mL Mouth Rinse 10 times per day End, ChrHarrell GaveD   15 mL at 12/16/19 1616   norepinephrine (LEVOPHED) 16 mg in 250m33memix infusion  0-80 mcg/min Intravenous Titrated End, Christopher, MD 7.5 mL/hr at 12/16/19 1700 8 mcg/min at 12/16/19 1700   ondansetron (ZOFRAN) injection 4 mg  4 mg Intravenous Q6H PRN End, ChriHarrell Gave       pantoprazole sodium (PROTONIX) 40 mg/20 mL oral suspension 40 mg  40 mg Per Tube QHS End, ChriHarrell Gave   40 mg at 12/15/19 2102   prismasol BGK 4/2.5 infusion    CRRT Continuous ColaDonato Heinz 1,500 mL/hr at 12/16/19 1329 New Bag at 12/16/19 1329   sodium chloride flush (NS) 0.9 % injection 10-40 mL  10-40 mL Intracatheter Q12H End, ChriHarrell Gave   10 mL at 12/16/19 0903   sodium chloride flush (NS) 0.9 % injection 10-40 mL  10-40 mL Intracatheter PRN End, ChriHarrell Gave       sodium chloride flush (NS) 0.9 % injection 3 mL  3 mL Intravenous Q12H End, Christopher, MD   3 mL at 12/16/19 0903   sodium chloride flush (NS) 0.9 % injection 3 mL  3 mL Intravenous PRN End, ChriHarrell Gave        VITAL SIGNS: BP (!) 135/59    Pulse 78    Temp (!) 97.5 F (36.4 C) (Esophageal)    Resp 16    Ht 5' 5"  (1.651 m)    Wt 97.2 kg    SpO2 100%    BMI 35.66 kg/m  Filed Weights   12/14/19 0100 12/15/19 0500 12/16/19 0302  Weight: 100.8 kg 100.3 kg 97.2 kg    Estimated body mass index is 35.66 kg/m as calculated from the following:   Height as of this encounter: 5' 5"  (1.651 m).   Weight as of this encounter: 97.2 kg.  LABS: CBC:    Component Value Date/Time   WBC 7.1 12/16/2019 0347   HGB 7.1 (L) 12/16/2019 0347   HCT 23.6 (L) 12/16/2019 0347   HCT 21.3 (L) 05/01/2018 0223   PLT 166 12/16/2019 0347   Comprehensive Metabolic Panel:    Component Value Date/Time   NA 138 12/16/2019 0347   NA 138 04/23/2018 0000   K 4.9 12/16/2019 0347   CO2 27 12/16/2019 0347   BUN 15 12/16/2019 0347   BUN 38 (A) 04/23/2018 0000  CREATININE 2.01 (H) 12/16/2019 0347   CREATININE 0.76 05/23/2011 0956   ALBUMIN 2.5 (L) 12/16/2019 0347     Review of Systems  Unable to perform ROS: Intubated   Unless otherwise noted, a complete review of systems is negative.  Physical Exam General: chronically-ill, frail appearing Cardiovascular: irregular  Pulmonary: bilateral rhonchi, intubated  Extremities: trace edema, bilateral boots Neurological: intubated, sedated, (hx deaf, mute, blind)   Prognosis: Poor  Recommendations:  Full Code-as confirmed by  son  Continue current plan of care per medical team  Son is in discussion with family regarding code status and how to proceed with care (aggressive vs. Comfort). He verbalizes he knows what his decisions are however, would like all family to be made aware with hopes of their support and understanding. He does not wish for his mother to suffer and speaks to making such a hard decision knowing she is his only mother and have fought for so [place]. He speaks to awareness that she is tired and does not like to see her in current state. He is aware of poor prognosis and poor quality of life. Leaning towards comfort.   Son plans to visit tomorrow afternoon/evening. If available will plan to meet him at the bedside for support in his decisions.   PMT will continue to support and follow.    Palliative Performance Scale: INTUBATED               [name] (son) expressed understanding and was in agreement with this plan.   Thank you for allowing the Palliative Medicine Team to assist in the care of this patient.  Time In: 1400 Time Out: 1505 Time Total: 65 min.   Visit consisted of counseling and education dealing with the complex and emotionally intense issues of symptom management and palliative care in the setting of serious and potentially life-threatening illness.Greater than 50%  of this time was spent counseling and coordinating care related to the above assessment and plan.  Signed by:  Alda Lea, AGPCNP-BC Palliative Medicine Team  Phone: [phone] Fax: [phone] Pager: [phone] Amion: Bjorn Pippin

## 2019-12-16 NOTE — Progress Notes (Signed)
CRITICAL VALUE ALERT  Critical Value:  PTT >200  Date & Time Notied:  12/16/2019 6433  Provider Notified: spoke with Kathlee Nations in [facility]  Orders Received/Actions taken: Nephrology and pharmacy aware; this is a recurrent issue.

## 2019-12-17 ENCOUNTER — Inpatient Hospital Stay (HOSPITAL_COMMUNITY): Payer: Medicare Other

## 2019-12-17 LAB — CBC WITH DIFFERENTIAL/PLATELET
Abs Immature Granulocytes: 0.14 10*3/uL — ABNORMAL HIGH (ref 0.00–0.07)
Basophils Absolute: 0.1 10*3/uL (ref 0.0–0.1)
Basophils Relative: 1 %
Eosinophils Absolute: 0.2 10*3/uL (ref 0.0–0.5)
Eosinophils Relative: 3 %
HCT: 22.2 % — ABNORMAL LOW (ref 36.0–46.0)
Hemoglobin: 7 g/dL — ABNORMAL LOW (ref 12.0–15.0)
Immature Granulocytes: 2 %
Lymphocytes Relative: 14 %
Lymphs Abs: 1.2 10*3/uL (ref 0.7–4.0)
MCH: 32.3 pg (ref 26.0–34.0)
MCHC: 31.5 g/dL (ref 30.0–36.0)
MCV: 102.3 fL — ABNORMAL HIGH (ref 80.0–100.0)
Monocytes Absolute: 1.2 10*3/uL — ABNORMAL HIGH (ref 0.1–1.0)
Monocytes Relative: 14 %
Neutro Abs: 5.8 10*3/uL (ref 1.7–7.7)
Neutrophils Relative %: 66 %
Platelets: 171 10*3/uL (ref 150–400)
RBC: 2.17 MIL/uL — ABNORMAL LOW (ref 3.87–5.11)
RDW: 18.5 % — ABNORMAL HIGH (ref 11.5–15.5)
WBC: 8.5 10*3/uL (ref 4.0–10.5)
nRBC: 0.8 % — ABNORMAL HIGH (ref 0.0–0.2)

## 2019-12-17 LAB — RENAL FUNCTION PANEL
Albumin: 2.4 g/dL — ABNORMAL LOW (ref 3.5–5.0)
Albumin: 2.6 g/dL — ABNORMAL LOW (ref 3.5–5.0)
Anion gap: 8 (ref 5–15)
Anion gap: 11 (ref 5–15)
BUN: 17 mg/dL (ref 8–23)
BUN: 24 mg/dL — ABNORMAL HIGH (ref 8–23)
CO2: 27 mmol/L (ref 22–32)
CO2: 24 mmol/L (ref 22–32)
Calcium: 8 mg/dL — ABNORMAL LOW (ref 8.9–10.3)
Calcium: 8.3 mg/dL — ABNORMAL LOW (ref 8.9–10.3)
Chloride: 99 mmol/L (ref 98–111)
Chloride: 102 mmol/L (ref 98–111)
Creatinine, Ser: 1.92 mg/dL — ABNORMAL HIGH (ref 0.44–1.00)
Creatinine, Ser: 2.16 mg/dL — ABNORMAL HIGH (ref 0.44–1.00)
GFR calc Af Amer: 32 mL/min — ABNORMAL LOW (ref >60)
GFR calc Af Amer: 27 mL/min — ABNORMAL LOW (ref >60)
GFR calc non Af Amer: 27 mL/min — ABNORMAL LOW (ref >60)
GFR calc non Af Amer: 24 mL/min — ABNORMAL LOW (ref >60)
Glucose, Bld: 145 mg/dL — ABNORMAL HIGH (ref 70–99)
Glucose, Bld: 187 mg/dL — ABNORMAL HIGH (ref 70–99)
Phosphorus: 1.9 mg/dL — ABNORMAL LOW (ref 2.5–4.6)
Phosphorus: 2.2 mg/dL — ABNORMAL LOW (ref 2.5–4.6)
Potassium: 4.6 mmol/L (ref 3.5–5.1)
Potassium: 4.9 mmol/L (ref 3.5–5.1)
Sodium: 134 mmol/L — ABNORMAL LOW (ref 135–145)
Sodium: 137 mmol/L (ref 135–145)

## 2019-12-17 LAB — POCT I-STAT EG7
Acid-Base Excess: 3 mmol/L — ABNORMAL HIGH (ref 0.0–2.0)
Bicarbonate: 29 mmol/L — ABNORMAL HIGH (ref 20.0–28.0)
Calcium, Ion: 1.12 mmol/L — ABNORMAL LOW (ref 1.15–1.40)
HCT: 24 % — ABNORMAL LOW (ref 36.0–46.0)
Hemoglobin: 8.2 g/dL — ABNORMAL LOW (ref 12.0–15.0)
O2 Saturation: 78 %
Patient temperature: 38.1
Potassium: 4.3 mmol/L (ref 3.5–5.1)
Sodium: 137 mmol/L (ref 135–145)
TCO2: 30 mmol/L (ref 22–32)
pCO2, Ven: 54.1 mmHg (ref 44.0–60.0)
pH, Ven: 7.341 (ref 7.250–7.430)
pO2, Ven: 49 mmHg — ABNORMAL HIGH (ref 32.0–45.0)

## 2019-12-17 LAB — POCT ACTIVATED CLOTTING TIME
Activated Clotting Time: 169 seconds
Activated Clotting Time: 180 seconds
Activated Clotting Time: 175 seconds
Activated Clotting Time: 169 seconds
Activated Clotting Time: 180 seconds
Activated Clotting Time: 164 seconds

## 2019-12-17 LAB — MAGNESIUM: Magnesium: 2.7 mg/dL — ABNORMAL HIGH (ref 1.7–2.4)

## 2019-12-17 LAB — APTT: aPTT: 110 seconds — ABNORMAL HIGH (ref 24–36)

## 2019-12-17 LAB — GLUCOSE, CAPILLARY
Glucose-Capillary: 115 mg/dL — ABNORMAL HIGH (ref 70–99)
Glucose-Capillary: 136 mg/dL — ABNORMAL HIGH (ref 70–99)
Glucose-Capillary: 139 mg/dL — ABNORMAL HIGH (ref 70–99)
Glucose-Capillary: 145 mg/dL — ABNORMAL HIGH (ref 70–99)
Glucose-Capillary: 151 mg/dL — ABNORMAL HIGH (ref 70–99)
Glucose-Capillary: 153 mg/dL — ABNORMAL HIGH (ref 70–99)
Glucose-Capillary: 193 mg/dL — ABNORMAL HIGH (ref 70–99)

## 2019-12-17 LAB — HEPARIN LEVEL (UNFRACTIONATED): Heparin Unfractionated: 0.3 IU/mL (ref 0.30–0.70)

## 2019-12-17 MED ORDER — SCOPOLAMINE 1 MG/3DAYS TD PT72
1.0000 | MEDICATED_PATCH | TRANSDERMAL | Status: DC
  Administered 2019-12-17 – 2019-12-23 (×4): 1.5 mg via TRANSDERMAL
  Filled 2019-12-17 (×4): qty 1

## 2019-12-17 MED ORDER — B COMPLEX-C PO TABS
1.0000 | ORAL_TABLET | Freq: Every day | ORAL | Status: DC
  Administered 2019-12-17 – 2019-12-23 (×7): 1 via ORAL
  Filled 2019-12-17 (×8): qty 1

## 2019-12-17 NOTE — Progress Notes (Signed)
Nutrition Follow up  DOCUMENTATION CODES:   Not applicable  INTERVENTION:   -Add B complex with Vitamin C  Continue tube feeding:  -Vital High Protein @ 45 ml/hr (1080 ml) via OGT -30 ml Prostat BID  Provides: 1280 kcals, 125 grams protein, 903 ml free water.   NUTRITION DIAGNOSIS:   Increased nutrient needs related to acute illness as evidenced by estimated needs.  Ongoing   GOAL:   Patient will meet greater than or equal to 90% of their needs   Addressed via TF  MONITOR:   Diet advancement, Vent status, Skin, TF tolerance, Weight trends, Labs, I & O's  REASON FOR ASSESSMENT:   Ventilator    ASSESSMENT:   Patient with PMH significant for deafness, DM, CAD with CM, ESRD on HD, CHF, essential HTN, HLD, moderate tricuspid regurgitation, and recent COVID 19 PNA (early November). Presents this admission with acute cardiac arrest.  12/17- s/p cardiac cath (partially successful) no stent  12/19- start CRRT  Pt discussed during ICU rounds and with RN.   TTM complete. Being kept even on CRRT. Remains on levophed. Difficult to wean as she is deaf, mute, and blind. Tolerating Vital High Protein at goal. No BM x 6 days- no current regimen in place. Family to come in today for [place] meeting.   Admission weight: 101 kg  Current weight: 96.9 kg   Patient remains intubated on ventilator support MV: 9.2 L/min Temp (24hrs), Avg:98 F (36.7 C), Min:96.3 F (35.7 C), Max:100.9 F (38.3 C)   I/O: +2,826 ml since admit  CRRT: 1,165 ml x 24 hrs   Drips: levophed, fentanyl Medications: SS novolog  Labs: Cr 7.15 CBG 110-154  Diet Order:   Diet Order            Diet NPO time specified  Diet effective now              EDUCATION NEEDS:   Not appropriate for education at this time  Skin:  Skin Assessment: Skin Integrity Issues: Skin Integrity Issues:: Stage II, Other (Comment) Stage II: buttocks Other: MASD- L breast  Last BM:  PTA  Height:   Ht Readings  from Last 1 Encounters:  11/26/2019 5\' 5"  (1.651 m)    Weight:   Wt Readings from Last 1 Encounters:  12/17/19 96.9 kg    Ideal Body Weight:  56.8 kg  BMI:  Body mass index is 35.55 kg/m.  Estimated Nutritional Needs:   Kcal:  1034-1316 kcal  Protein:  114- 142 grams  Fluid:  1000 ml + UOP   Mariana Single RD, LDN Clinical Nutrition Pager # [phone]

## 2019-12-17 NOTE — Progress Notes (Signed)
IV Versed 40 CCs wasted in [place] with Payton Emerald

## 2019-12-17 NOTE — Progress Notes (Signed)
[facility] ROUNDING NOTE   Subjective:   This is a 63 year old lady history of end-stage renal disease dialysis dependent, diabetes mellitus type 2, history of congestive heart failure, hypertension, hyperlipidemia history of myocardial infarction 11/2014 with history of pulmonary hypertension and psychiatric disorder.    Admitted 11/26/2019 with witnessed cardiac arrest while doing physical therapy at SNF.  Underwent CPR for 15 minutes including 1 shock.  Code STEMI called patient taken emergently to Cath Lab that demonstrated severe two-vessel disease with CTO of ostial RCA &subacute or chronic total occlusion of mid LAD - PTCA of mid LAD &D2 w/ reestablishment of TIMI-3 flow, stent not attempted due to small vessel size and heavy calcification.  Acute respiratory failure with hypoxemia secondary to cardiac arrest continues on ventilator support.  Shock most likely cardiogenic continues on Levophed.  CRRT initiated 12/14/2019.  Acute encephalopathy unable to assess secondary to sedation needs possible risk for hypoxic brain injury.  Blood pressure 121/42 pulse 102 temperature 98.4 O2 sats 100% FiO2 30  IV Levophed  Aspirin 81 mg daily, Lipitor 10 mg daily, Plavix 75 mg daily, insulin sliding scale, Protonix 40 mg daily  Being kept even on CRRT no fluid removed  Sodium 134 potassium 4.6 chloride 99 CO2 27 BUN 17 creatinine 1.92 glucose 145 calcium 8 phosphorus 1.9 magnesium 2.7 albumin 2.4 WBC 8.5 hemoglobin 7.0 platelets 171     Objective:  Vital signs in last 24 hours:  Temp:  [95 F (35 C)-98.6 F (37 C)] 98.4 F (36.9 C) (12/23 1000) Pulse Rate:  [63-102] 102 (12/23 1000) Resp:  [0-26] 26 (12/23 1000) BP: (107-147)/(41-68) 122/62 (12/23 0900) SpO2:  [99 %-100 %] 100 % (12/23 1000) Arterial Line BP: (109-171)/(38-97) 121/42 (12/23 1000) FiO2 (%):  [30 %] 30 % (12/23 1000) Weight:  [96.9 kg] 96.9 kg (12/23 0500)  Weight change: -0.3 kg Filed Weights    12/15/19 0500 12/16/19 0302 12/17/19 0500  Weight: 100.3 kg 97.2 kg 96.9 kg    Intake/Output: I/O last 3 completed shifts: In: 1698.2 [I.V.:648.2; NG/GT:1050] Out: 1601 [Emesis/NG output:100; Other:1501]   Intake/Output this shift:  Total I/O In: 50.9 [I.V.:5.9; NG/GT:45] Out: 191 [Other:191]  Gen: intubated and sedated CVS: no rub Resp: occ rhonchi Abd: +BS, soft Ext: trace edema, LUE AVF +T/B, LIJ temp HD catheter   Basic Metabolic Panel: Recent Labs  Lab 12/12/19 0107 12/12/19 0431 12/14/19 0409 12/15/19 0337 12/15/19 1559 12/16/19 0347 12/16/19 1628 12/17/19 0333  NA 135  --  136 137 135 138 137 134*  K 3.8  --  4.7 4.8 5.0 4.9 4.9 4.6  CL 96*   < > 100 102 100 101 103 99  CO2 26   < > 27 26 25 27 27 27   GLUCOSE 137*   < > 159* 125* 130* 106* 135* 145*  BUN 26*   < > 30* 21 18 15 14 17   CREATININE 6.36*   < > 4.47* 2.86* 2.89* 2.01* 2.11* 1.92*  CALCIUM 7.7*   < > 7.2* 7.5* 7.9* 8.1* 8.0* 8.0*  MG 2.4  --  2.4 2.4  --  2.5*  --  2.7*  PHOS 2.3*   < > 2.8 2.6 2.6 2.1* 2.2* 1.9*   < > = values in this interval not displayed.    Liver Function Tests: Recent Labs  Lab 12/15/19 0337 12/15/19 1559 12/16/19 0347 12/16/19 1628 12/17/19 0333  ALBUMIN 2.5* 2.5* 2.5* 2.3* 2.4*   No results for input(s): LIPASE, AMYLASE in the  last 168 hours. No results for input(s): AMMONIA in the last 168 hours.  CBC: Recent Labs  Lab 12/12/19 0107 12/13/19 0338 12/13/19 1001 12/13/19 1153 12/15/19 0506 12/16/19 0347 12/17/19 0333  WBC 6.1 9.5  --   --  10.4 7.1 8.5  NEUTROABS  --   --   --   --  6.9 4.6 5.8  HGB 9.8* 10.3* 11.6* 11.6* 8.0* 7.1* 7.0*  HCT 29.6* 32.4* 34.0* 34.0* 25.9* 23.6* 22.2*  MCV 96.7 99.7  --   --  102.0* 103.1* 102.3*  PLT 198 248  --   --  179 166 171    Cardiac Enzymes: No results for input(s): CKTOTAL, CKMB, CKMBINDEX, TROPONINI in the last 168 hours.  BNP: Invalid input(s): POCBNP  CBG: Recent Labs  Lab 12/16/19 1553  12/16/19 2010 12/17/19 0006 12/17/19 0338 12/17/19 0805  GLUCAP 132* 119* 136* 153* 115*    Microbiology: Results for orders placed or performed during the hospital encounter of 12/10/2019  MRSA PCR Screening     Status: None   Collection Time: 12/20/2019  4:16 PM   Specimen: Nasopharyngeal  Result Value Ref Range Status   MRSA by PCR NEGATIVE NEGATIVE Final    Comment:        The GeneXpert MRSA Assay (FDA approved for NASAL specimens only), is one component of a comprehensive MRSA colonization surveillance program. It is not intended to diagnose MRSA infection nor to guide or monitor treatment for MRSA infections. Performed at [facility], [place] [address], [place], [place] [address]     Coagulation Studies: No results for input(s): LABPROT, INR in the last 72 hours.  Urinalysis: No results for input(s): COLORURINE, LABSPEC, PHURINE, GLUCOSEU, HGBUR, BILIRUBINUR, KETONESUR, PROTEINUR, UROBILINOGEN, NITRITE, LEUKOCYTESUR in the last 72 hours.  Invalid input(s): APPERANCEUR    Imaging: AM DG Chest Port 1 View  Result Date: 12/17/2019 CLINICAL DATA:  ET tube, respiratory failure EXAM: PORTABLE CHEST 1 VIEW COMPARISON:  12/15/2019 FINDINGS: Support devices are unchanged. Low lung volumes with vascular congestion and bibasilar atelectasis. Heart is normal size. No effusions or pneumothorax. No acute bony abnormality. IMPRESSION: Low lung volumes with vascular congestion and bibasilar atelectasis. Stable support devices. Electronically Signed   By: Rolm Baptise M.D.   On: 12/17/2019 08:33     Medications:   .  prismasol BGK 4/2.5 500 mL/hr at 12/16/19 2104  .  prismasol BGK 4/2.5 300 mL/hr at 12/17/19 0603  . sodium chloride 10 mL/hr at 12/20/2019 1445  . sodium chloride Stopped (12/15/19 0734)  . fentaNYL infusion INTRAVENOUS 40 mcg/hr (12/17/19 0800)  . heparin 10,000 units/ 20 mL infusion syringe 900 Units/hr (12/17/19 0501)  . norepinephrine (LEVOPHED) Adult  infusion 2 mcg/min (12/17/19 0800)  . prismasol BGK 4/2.5 1,500 mL/hr at 12/17/19 0926   . aspirin  81 mg Per NG tube Daily  . atorvastatin  10 mg Per Tube QPM  . chlorhexidine gluconate (MEDLINE KIT)  15 mL Mouth Rinse BID  . Chlorhexidine Gluconate Cloth  6 each Topical Q0600  . clopidogrel  75 mg Per Tube QPM  . feeding supplement (PRO-STAT SUGAR FREE 64)  30 mL Per Tube BID  . feeding supplement (VITAL HIGH PROTEIN)  1,000 mL Per Tube Q24H  . insulin aspart  3-9 Units Subcutaneous Q4H  . liver oil-zinc oxide   Topical BID  . mouth rinse  15 mL Mouth Rinse 10 times per day  . pantoprazole sodium  40 mg Per Tube QHS  .  sodium chloride flush  10-40 mL Intracatheter Q12H  . sodium chloride flush  3 mL Intravenous Q12H   sodium chloride, acetaminophen, fentaNYL, heparin, heparin, heparin, ondansetron (ZOFRAN) IV, sodium chloride flush, sodium chloride flush  Assessment/ Plan:  1. STEMI/cardiac arrest- s vessel CAD with PCI with cardiogenic shock requiring pressors. 2. AMS- EEG with severe encephalopathy.  Difficult to assess due to sedation. 3. Hypotension- requiring pressors 4. ESRD- medically too unstable for IHD at this time.  CRRT started 12/13/2019.  Family wants aggressive medical care and does not wish to pursue palliative measures at this time. 5. Anemia of CKD stage 5. Stable 6. Combined CHF, chronic 7. SHPTH 8. Vascular access- LUE AVF, right fem tlcs/p LIJtemp HD cath for CVVHDplaced 12/12/19. 9. Disposition- pt remains critically ill and overall prognosis is poor.  Agree with ongoing discussions with family regarding goals/limits of care    LOS: [place] @TODAY @10 :25 AM

## 2019-12-17 NOTE — Progress Notes (Signed)
Right Femoral groin IV TLC site changed via sterile procedure per protocol w/Emer, RN. Site clean,dry and intact.

## 2019-12-17 NOTE — Progress Notes (Signed)
NAME:  [name], MRN:  [id number], DOB:  September 19, 1956, LOS: 6 ADMISSION DATE:  12/08/2019, CONSULTATION DATE: 11/28/2019 REFERRING MD: Dr. Saunders [name], Cardiology, CHIEF COMPLAINT: Cardiac arrest, acute respiratory failure  Brief History   22, DM, CAD with CM, COVID-19 pneumonia early November.  Admitted 12/17 from SNF after acute arrest, presumed ventricular arrhythmia.  CPR 15 minutes. Partially successful PTCI LAD 12/17, no stent.  To ICU for TTM 33.   Past Medical History   has a past medical history of Anemia, Anemia in CKD (chronic kidney disease) (12/14/2014), Benign hypertension with ESRD (end-stage renal disease) ([place]) (02/12/2018), Bilateral leg edema, Blind, Cardiac arrest ([place]) (12/12/2014), CHF (congestive heart failure) ([place]), Coronary artery disease, Deaf, Diabetes mellitus, ESRD on hemodialysis ([place]), Essential hypertension (08/09/2009), Fibroid uterus, Heart murmur, Hyperlipidemia, Hypertension, Leiomyoma of uterus (08/09/2009), MI (myocardial infarction) ([place]) (11/2014), Moderate tricuspid regurgitation, Osteomyelitis ([place]), Psychiatric disorder, and Pulmonary hypertension ([place]).   Significant Hospital Events   Cardiac arrest 12/17 Cardiac catheterization 12/17 12/19 -  eeg stopped. On CRRt. On fent gtt. On levophed gtt. Off Nimbex. Rewarmed early hours today. CRRT started 12/21> remains on pressors. Remains on CRRT (net even due to shock). Remains intubated and sedated.  12/16/2019 palliative care is following will sign Consults:  Cardiology signed off 12/17/2019 Nephrology PCCM Palliative Procedures:  Left heart catheterization 12/17 >>   Significant Diagnostic Tests:   EEG 12/17 > profound severe encephalopathy, non specific to etiology. No seizures or epileptiform discharges   TTE 12/17 >> LVEF < 20%. LV dilated with akinesis of mid and apical segments. No LV thrombus. Grade II LV diastolic dysfunction.   Micro Data:    Antimicrobials:    Interim  history/subjective:   Remains intubated.  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  Son does come in and help communicate with her via signing via her palm.  Objective   Blood pressure (!) 111/42, pulse 95, temperature 97.9 F (36.6 C), resp. rate (!) 26, height 5\' 5"  (1.651 m), weight 96.9 kg, SpO2 100 %. 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 At risk for anoxic injury in setting of cardiac arrest  12/13/2019 - did follow command x1. Unable to assess 12/20 due to sedation needs -Diffuse slowing on EEG suggestive of severe encephalopathy.  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 Wean as tolerated  S/p Cardiac Arrest -presumed VT/VF given her history of CAD and prior ventricular arrhythmias, shockable initial rhythm with AED. - s/p 33 TTM with rewarming P Continue supportive care   Shock -likely cardiogenic in setting of CAD, STEMI, Cardiac arrest -possible component of sedation related hypotension  P Continue vasopressors as needed   CAD Acute STEMI -Post cardiac catheterization with attempted PTCI to LAD, only partially successful, no stent placed  Severe ICM -LVEF < 20% P Cardiology reports there is no further interventions available. Poor chance of any meaningful recovery Palliative care is meeting with son [name]'s Ongoing discussions about goals of care Cardiology is signed off at this time.   Hx HTN Holding antihypertensives in the setting of shock  End-stage renal disease, chronic dialysis Plan Continue CRRT per nephrology  Diabetes mellitus CBG (last 3)  Recent Labs    12/17/19 0006 12/17/19 0338 12/17/19 0805  GLUCAP 136* 153* 115*    Sliding-scale insulin coverage  Chronic sacral pressure injury -present prior to admission P Continue current care as dictated by wound ostomy team   Best practice:  Diet: TF Pain/Anxiety/Delirium protocol (if indicated): Versed, fentanyl VAP protocol (if indicated): Yes DVT prophylaxis: heparin via crrt circuit  GI prophylaxis: PPI Glucose control: SSI  Mobility: BR Code Status: Full Family Communication: Summary this was counseled by palliative care 12/14/2019 he is aware of the poor prognosis of his mother.  He is understanding and is dealing with a large family to deal with end-of-life discussions.  He is to peer today 12/17/2019 per field communications with the pulmonary critical care team you have assumed care. Disposition: ICU   LABS    PULMONARY Recent Labs  Lab 12/12/19 0431 12/12/19 0802 12/13/19 0350 12/13/19 0611 12/13/19 0806 12/13/19 1001 12/13/19 1153   PHART 7.650* 7.535* 7.355 7.367 7.360  --   --   PCO2ART 25.5* 35.2 51.4* 47.6 50.3*  --   --   PO2ART 102.0 122.0* 84.0 75.0* 86.0  --   --   HCO3 28.8* 30.9* 29.1* 27.8 28.5*  --   --   TCO2 30 32 31 29 30 27 26   O2SAT 99.0 99.0 96.0 95.0 96.0  --   --     CBC Recent Labs  Lab 12/15/19 0506 12/16/19 0347 12/17/19 0333  HGB 8.0* 7.1* 7.0*  HCT 25.9* 23.6* 22.2*  WBC 10.4 7.1 8.5  PLT 179 166 171    COAGULATION Recent Labs  Lab 11/25/2019 1644 12/12/19 0058  INR 1.5* 1.2    CARDIAC  No results for input(s): TROPONINI in the last 168 hours. No results for input(s): PROBNP in the last 168 hours.   CHEMISTRY Recent Labs  Lab 12/12/19 0107 12/12/19 0431 12/14/19 0409 12/15/19 0337 12/15/19 1559 12/16/19 0347 12/16/19 1628 12/17/19 0333  NA 135  --  136 137 135 138 137 134*  K 3.8  --  4.7 4.8 5.0 4.9 4.9 4.6  CL 96*   < > 100 102 100 101 103 99  CO2 26   < > 27 26 25 27 27 27   GLUCOSE 137*   < > 159* 125* 130* 106* 135* 145*  BUN 26*   < >  30* 21 18 15 14 17   CREATININE 6.36*   < > 4.47* 2.86* 2.89* 2.01* 2.11* 1.92*  CALCIUM 7.7*   < > 7.2* 7.5* 7.9* 8.1* 8.0* 8.0*  MG 2.4  --  2.4 2.4  --  2.5*  --  2.7*  PHOS 2.3*   < > 2.8 2.6 2.6 2.1* 2.2* 1.9*   < > = values in this interval not displayed.   Estimated Creatinine Clearance: 34.6 mL/min (A) (by C-G formula based on SCr of 1.92 mg/dL (H)).   LIVER Recent Labs  Lab 12/08/2019 1644 12/12/19 0058 12/15/19 0337 12/15/19 1559 12/16/19 0347 12/16/19 1628 12/17/19 0333  ALBUMIN  --   --  2.5* 2.5* 2.5* 2.3* 2.4*  INR 1.5* 1.2  --   --   --   --   --      INFECTIOUS No results for input(s): LATICACIDVEN, PROCALCITON in the last 168 hours.   ENDOCRINE CBG (last 3)  Recent Labs    12/17/19 0006 12/17/19 0338 12/17/19 0805  GLUCAP 136* 153* 115*    IMAGING x48h  - image(s) personally visualized  -   highlighted in bold No results found.   12/17/2019 chest x-ray reviewed significant  change.  App cct 30 mi   Steve Tori Cupps ACNP Acute Care Nurse Practitioner [place] Please consult Amion 12/17/2019, 8:29 AM

## 2019-12-18 ENCOUNTER — Inpatient Hospital Stay (HOSPITAL_COMMUNITY): Payer: Medicare Other

## 2019-12-18 DIAGNOSIS — Z978 Presence of other specified devices: Secondary | ICD-10-CM

## 2019-12-18 LAB — POCT ACTIVATED CLOTTING TIME
Activated Clotting Time: 158 seconds
Activated Clotting Time: 164 seconds
Activated Clotting Time: 169 seconds
Activated Clotting Time: 164 seconds
Activated Clotting Time: 175 seconds
Activated Clotting Time: 169 seconds

## 2019-12-18 LAB — RENAL FUNCTION PANEL
Albumin: 2.5 g/dL — ABNORMAL LOW (ref 3.5–5.0)
Albumin: 2.8 g/dL — ABNORMAL LOW (ref 3.5–5.0)
Anion gap: 7 (ref 5–15)
Anion gap: 11 (ref 5–15)
BUN: 24 mg/dL — ABNORMAL HIGH (ref 8–23)
BUN: 26 mg/dL — ABNORMAL HIGH (ref 8–23)
CO2: 27 mmol/L (ref 22–32)
CO2: 26 mmol/L (ref 22–32)
Calcium: 8.3 mg/dL — ABNORMAL LOW (ref 8.9–10.3)
Calcium: 8.4 mg/dL — ABNORMAL LOW (ref 8.9–10.3)
Chloride: 103 mmol/L (ref 98–111)
Chloride: 100 mmol/L (ref 98–111)
Creatinine, Ser: 2.13 mg/dL — ABNORMAL HIGH (ref 0.44–1.00)
Creatinine, Ser: 1.99 mg/dL — ABNORMAL HIGH (ref 0.44–1.00)
GFR calc Af Amer: 28 mL/min — ABNORMAL LOW (ref >60)
GFR calc Af Amer: 30 mL/min — ABNORMAL LOW (ref >60)
GFR calc non Af Amer: 24 mL/min — ABNORMAL LOW (ref >60)
GFR calc non Af Amer: 26 mL/min — ABNORMAL LOW (ref >60)
Glucose, Bld: 135 mg/dL — ABNORMAL HIGH (ref 70–99)
Glucose, Bld: 183 mg/dL — ABNORMAL HIGH (ref 70–99)
Phosphorus: 1.9 mg/dL — ABNORMAL LOW (ref 2.5–4.6)
Phosphorus: 2.6 mg/dL (ref 2.5–4.6)
Potassium: 4.6 mmol/L (ref 3.5–5.1)
Potassium: 4.4 mmol/L (ref 3.5–5.1)
Sodium: 137 mmol/L (ref 135–145)
Sodium: 137 mmol/L (ref 135–145)

## 2019-12-18 LAB — CBC WITH DIFFERENTIAL/PLATELET
Abs Immature Granulocytes: 0.61 10*3/uL — ABNORMAL HIGH (ref 0.00–0.07)
Basophils Absolute: 0.1 10*3/uL (ref 0.0–0.1)
Basophils Relative: 0 %
Eosinophils Absolute: 0.1 10*3/uL (ref 0.0–0.5)
Eosinophils Relative: 1 %
HCT: 19.2 % — ABNORMAL LOW (ref 36.0–46.0)
Hemoglobin: 6.1 g/dL — CL (ref 12.0–15.0)
Immature Granulocytes: 5 %
Lymphocytes Relative: 12 %
Lymphs Abs: 1.4 10*3/uL (ref 0.7–4.0)
MCH: 31.9 pg (ref 26.0–34.0)
MCHC: 31.8 g/dL (ref 30.0–36.0)
MCV: 100.5 fL — ABNORMAL HIGH (ref 80.0–100.0)
Monocytes Absolute: 1.7 10*3/uL — ABNORMAL HIGH (ref 0.1–1.0)
Monocytes Relative: 14 %
Neutro Abs: 7.9 10*3/uL — ABNORMAL HIGH (ref 1.7–7.7)
Neutrophils Relative %: 68 %
Platelets: 181 10*3/uL (ref 150–400)
RBC: 1.91 MIL/uL — ABNORMAL LOW (ref 3.87–5.11)
RDW: 18.6 % — ABNORMAL HIGH (ref 11.5–15.5)
WBC: 11.8 10*3/uL — ABNORMAL HIGH (ref 4.0–10.5)
nRBC: 2.5 % — ABNORMAL HIGH (ref 0.0–0.2)

## 2019-12-18 LAB — GLUCOSE, CAPILLARY
Glucose-Capillary: 133 mg/dL — ABNORMAL HIGH (ref 70–99)
Glucose-Capillary: 152 mg/dL — ABNORMAL HIGH (ref 70–99)
Glucose-Capillary: 137 mg/dL — ABNORMAL HIGH (ref 70–99)
Glucose-Capillary: 163 mg/dL — ABNORMAL HIGH (ref 70–99)
Glucose-Capillary: 145 mg/dL — ABNORMAL HIGH (ref 70–99)

## 2019-12-18 LAB — CBC
HCT: 23 % — ABNORMAL LOW (ref 36.0–46.0)
Hemoglobin: 7.4 g/dL — ABNORMAL LOW (ref 12.0–15.0)
MCH: 31.6 pg (ref 26.0–34.0)
MCHC: 32.2 g/dL (ref 30.0–36.0)
MCV: 98.3 fL (ref 80.0–100.0)
Platelets: 198 10*3/uL (ref 150–400)
RBC: 2.34 MIL/uL — ABNORMAL LOW (ref 3.87–5.11)
RDW: 19.5 % — ABNORMAL HIGH (ref 11.5–15.5)
WBC: 13.2 10*3/uL — ABNORMAL HIGH (ref 4.0–10.5)
nRBC: 2.3 % — ABNORMAL HIGH (ref 0.0–0.2)

## 2019-12-18 LAB — POCT I-STAT 7, (LYTES, BLD GAS, ICA,H+H)
Acid-Base Excess: 3 mmol/L — ABNORMAL HIGH (ref 0.0–2.0)
Bicarbonate: 27.7 mmol/L (ref 20.0–28.0)
Calcium, Ion: 1.12 mmol/L — ABNORMAL LOW (ref 1.15–1.40)
HCT: 20 % — ABNORMAL LOW (ref 36.0–46.0)
Hemoglobin: 6.8 g/dL — CL (ref 12.0–15.0)
O2 Saturation: 97 %
Potassium: 4.6 mmol/L (ref 3.5–5.1)
Sodium: 136 mmol/L (ref 135–145)
TCO2: 29 mmol/L (ref 22–32)
pCO2 arterial: 39.2 mmHg (ref 32.0–48.0)
pH, Arterial: 7.457 — ABNORMAL HIGH (ref 7.350–7.450)
pO2, Arterial: 87 mmHg (ref 83.0–108.0)

## 2019-12-18 LAB — APTT: aPTT: 90 seconds — ABNORMAL HIGH (ref 24–36)

## 2019-12-18 LAB — PHOSPHORUS: Phosphorus: 1.9 mg/dL — ABNORMAL LOW (ref 2.5–4.6)

## 2019-12-18 LAB — PREPARE RBC (CROSSMATCH)

## 2019-12-18 LAB — MAGNESIUM: Magnesium: 2.6 mg/dL — ABNORMAL HIGH (ref 1.7–2.4)

## 2019-12-18 MED ORDER — SODIUM CHLORIDE 0.9% IV SOLUTION: Freq: Once | INTRAVENOUS | Status: DC

## 2019-12-18 MED ORDER — SODIUM PHOSPHATES 45 MMOLE/15ML IV SOLN
20.0000 mmol | Freq: Once | INTRAVENOUS | Status: AC
  Administered 2019-12-18: 20 mmol via INTRAVENOUS
  Filled 2019-12-18: qty 6.67

## 2019-12-18 NOTE — Progress Notes (Signed)
NAME:  [name], MRN:  [id number], DOB:  1956/11/08, LOS: 7 ADMISSION DATE:  12/10/2019, CONSULTATION DATE: 11/25/2019 REFERRING MD: Dr. Saunders [name], Cardiology, CHIEF COMPLAINT: Cardiac arrest, acute respiratory failure  Brief History   67, DM, CAD with CM, COVID-19 pneumonia early November.  Admitted 12/17 from SNF after acute arrest, presumed ventricular arrhythmia.  CPR 15 minutes. Partially successful PTCI LAD 12/17, no stent.  To ICU for TTM 33.   Past Medical History   has a past medical history of Anemia, Anemia in CKD (chronic kidney disease) (12/14/2014), Benign hypertension with ESRD (end-stage renal disease) ([place]) (02/12/2018), Bilateral leg edema, Blind, Cardiac arrest ([place]) (12/12/2014), CHF (congestive heart failure) ([place]), Coronary artery disease, Deaf, Diabetes mellitus, ESRD on hemodialysis ([name]), Essential hypertension (08/09/2009), Fibroid uterus, Heart murmur, Hyperlipidemia, Hypertension, Leiomyoma of uterus (08/09/2009), MI (myocardial infarction) ([place]) (11/2014), Moderate tricuspid regurgitation, Osteomyelitis ([place]), Psychiatric disorder, and Pulmonary hypertension ([place]).   Significant Hospital Events   Cardiac arrest 12/17 Cardiac catheterization 12/17 12/19 -  eeg stopped. On CRRt. On fent gtt. On levophed gtt. Off Nimbex. Rewarmed early hours today. CRRT started 12/21> remains on pressors. Remains on CRRT (net even due to shock). Remains intubated and sedated.  12/16/2019 palliative care is following will sign Consults:  Cardiology signed off 12/17/2019 Nephrology PCCM Palliative Procedures:  Left heart catheterization 12/17 >>   Significant Diagnostic Tests:   EEG 12/17 > profound severe encephalopathy, non specific to etiology. No seizures or epileptiform discharges   TTE 12/17 >> LVEF < 20%. LV dilated with akinesis of mid and apical segments. No LV thrombus. Grade II LV diastolic dysfunction.   Micro Data:    Antimicrobials:    Interim  history/subjective:  Remains intubated continue to await son's input as to goals of care.  Noted hemoglobin less than 7 we will transfuse 1 unit packed cells  Objective   Blood pressure 107/61, pulse 82, temperature 97.7 F (36.5 C), resp. rate (!) 23, height 5\' 5"  (1.651 m), weight 96 kg, SpO2 100 %. CVP:  [12 mmHg-18 mmHg] 15 mmHg  Vent Mode: PSV;CPAP FiO2 (%):  [30 %] 30 % Set Rate:  [12 bmp] 12 bmp Vt Set:  [450 mL] 450 mL PEEP:  [5 cmH20] 5 cmH20 Pressure Support:  [10 cmH20-12 cmH20] 12 cmH20 Plateau Pressure:  [24 cmH20-29 cmH20] 29 cmH20   Intake/Output Summary (Last 24 hours) at 12/18/2019 0940 Last data filed at 12/18/2019 0900 Gross per 24 hour  Intake 1675.25 ml  Output 1332 ml  Net 343.25 ml   Filed Weights   12/16/19 0302 12/17/19 0500 12/18/19 0500  Weight: 97.2 kg 96.9 kg 96 kg    General: Elderly female who is poorly responsive but does become agitated when stimulated HEENT: Endotracheal tube gastric tube in place Neuro: Does not follow commands but she is deaf mute and blind CV: Heart sounds are regular regular rate rhythm PULM: Mild rhonchi Vent pressure support of 12 with a tidal volume of 420 FIO2 40 PEEP 5 RATE spontaneous 14 VT approximately 420 cc  GI: soft, bsx4 active  GU: Does not make urine Extremities: 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 At risk for anoxic injury in setting of cardiac arrest  12/13/2019 - did follow command x1. Unable to assess 12/20 due to sedation needs -Diffuse slowing on EEG suggestive of severe encephalopathy.  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  Her  mental status precludes extubation at this time. Wean off all sedation Could give consideration to attempted extubation and reintubation if fails.  The sinus continued to try to make a decision concerning extubation.   S/p Cardiac Arrest -presumed VT/VF given her history of CAD and prior ventricular arrhythmias, shockable initial rhythm with AED. - s/p 33 TTM with rewarming P Continue supportive care   Anemia Recent Labs    12/18/19 0201 12/18/19 0533  HGB 6.1* 6.8*   P Transfuse per protocol  Shock -likely cardiogenic in setting of CAD, STEMI, Cardiac arrest -possible component of sedation related hypotension  P Currently off pressors   CAD Acute STEMI -Post cardiac catheterization with attempted PTCI to LAD, only partially successful, no stent placed  Severe ICM -LVEF < 20% P Cardiology has signed off there is no further interventions available For chance of any meaningful recovery We continue to have ongoing discussions via palliative care concerning goals of care. Her son [name] is in charge of goals of care.    Hx HTN Holding antihypertensive  End-stage renal disease, chronic dialysis Plan CRRT per nephrology she is now off pressors she may be able to transition to intermittent hemodialysis we will leave this up to the nephrology team.  Diabetes mellitus CBG (last 3)  Recent Labs    12/17/19 2343 12/18/19 0412 12/18/19 0914  GLUCAP 145* 133* 152*    Sliding-scale insulin coverage with adequate coverage at this time  Chronic sacral pressure injury -present prior to admission P Continue to care as directed by wound ostomy team   Best practice:  Diet: TF Pain/Anxiety/Delirium protocol (if indicated): Versed, fentanyl VAP protocol (if indicated): Yes DVT prophylaxis: heparin via crrt circuit  GI prophylaxis: PPI Glucose control: SSI  Mobility: BR Code Status: Full Family Communication: Spoke with son [name] 12/18/2019.  He is still  engaging other family members in conversation about goals of care for his mother.  He was updated at length and expressed appreciation for the update.  For now we will continue aggressive care and we await further input from him and family members. Disposition: ICU   LABS    PULMONARY Recent Labs  Lab 12/12/19 0802 12/13/19 0350 12/13/19 0611 12/13/19 0806 12/13/19 1001 12/13/19 1153 12/17/19 1222 12/18/19 0533  PHART 7.535* 7.355 7.367 7.360  --   --   --  7.457*  PCO2ART 35.2 51.4* 47.6 50.3*  --   --   --  39.2  PO2ART 122.0* 84.0 75.0* 86.0  --   --   --  87.0  HCO3 30.9* 29.1* 27.8 28.5*  --   --  29.0* 27.7  TCO2 32 31 29 30 27 26 30 29   O2SAT 99.0 96.0 95.0 96.0  --   --  78.0 97.0    CBC Recent Labs  Lab 12/16/19 0347 12/17/19 0333 12/17/19 1222 12/18/19 0201 12/18/19 0533  HGB 7.1* 7.0* 8.2* 6.1* 6.8*  HCT 23.6* 22.2* 24.0* 19.2* 20.0*  WBC 7.1 8.5  --  11.8*  --   PLT 166 171  --  181  --     COAGULATION Recent Labs  Lab 12/10/2019 1644 12/12/19 0058  INR 1.5* 1.2    CARDIAC  No results for input(s): TROPONINI in  the last 168 hours. No results for input(s): PROBNP in the last 168 hours.   CHEMISTRY Recent Labs  Lab 12/14/19 0409 12/15/19 0337 12/16/19 0347 12/16/19 1628 12/17/19 0333 12/17/19 1222 12/17/19 1659 12/18/19 0201 12/18/19 0533  NA 136 137 138 137 134* 137 137 137 136  K 4.7 4.8 4.9 4.9 4.6 4.3 4.9 4.6 4.6  CL 100 102 101 103 99  --  102 103  --   CO2 27 26 27 27 27   --  24 27  --   GLUCOSE 159* 125* 106* 135* 145*  --  187* 135*  --   BUN 30* 21 15 14 17   --  24* 24*  --   CREATININE 4.47* 2.86* 2.01* 2.11* 1.92*  --  2.16* 2.13*  --   CALCIUM 7.2* 7.5* 8.1* 8.0* 8.0*  --  8.3* 8.3*  --   MG 2.4 2.4 2.5*  --  2.7*  --   --  2.6*  --   PHOS 2.8 2.6 2.1* 2.2* 1.9*  --  2.2* 1.9*  1.9*  --    Estimated Creatinine Clearance: 31 mL/min (A) (by C-G formula based on SCr of 2.13 mg/dL (H)).   LIVER Recent Labs  Lab  12/01/2019 1644 12/12/19 0058 12/16/19 0347 12/16/19 1628 12/17/19 0333 12/17/19 1659 12/18/19 0201  ALBUMIN  --   --  2.5* 2.3* 2.4* 2.6* 2.5*  INR 1.5* 1.2  --   --   --   --   --      INFECTIOUS No results for input(s): LATICACIDVEN, PROCALCITON in the last 168 hours.   ENDOCRINE CBG (last 3)  Recent Labs    12/17/19 2343 12/18/19 0412 12/18/19 0914  GLUCAP 145* 133* 152*    IMAGING x48h  - image(s) personally visualized  -   highlighted in bold AM DG Chest Port 1 View  Result Date: 12/18/2019 CLINICAL DATA:  Respiratory failure. EXAM: PORTABLE CHEST 1 VIEW COMPARISON:  12/17/2019 FINDINGS: The endotracheal tube is 3 cm above the carina. The left IJ catheter is stable. The NG tube is coursing down the esophagus and into the stomach. Persistent low lung volumes with vascular crowding and streaky basilar atelectasis. No effusions or pneumothorax. IMPRESSION: 1. Stable support apparatus. 2. Persistent bibasilar atelectasis. Electronically Signed   By: Marijo Sanes M.D.   On: 12/18/2019 08:47   AM DG Chest Port 1 View  Result Date: 12/17/2019 CLINICAL DATA:  ET tube, respiratory failure EXAM: PORTABLE CHEST 1 VIEW COMPARISON:  12/15/2019 FINDINGS: Support devices are unchanged. Low lung volumes with vascular congestion and bibasilar atelectasis. Heart is normal size. No effusions or pneumothorax. No acute bony abnormality. IMPRESSION: Low lung volumes with vascular congestion and bibasilar atelectasis. Stable support devices. Electronically Signed   By: Rolm Baptise M.D.   On: 12/17/2019 08:33     App cct 30 min   Richardson Landry Emelio Schneller ACNP Acute Care Nurse Practitioner [place] Please consult [place] 12/18/2019, 9:40 AM

## 2019-12-18 NOTE — Progress Notes (Signed)
Pt RR was in the upper 30's and pt was agitated while on PSV/CPAP. RT placed pt back on full support at this time. RT will continue to monitor.

## 2019-12-18 NOTE — Progress Notes (Signed)
[facility] ROUNDING NOTE   Subjective:   This is a 63 year old lady history of end-stage renal disease dialysis dependent, diabetes mellitus type 2, history of congestive heart failure, hypertension, hyperlipidemia history of myocardial infarction 11/2014 with history of pulmonary hypertension and psychiatric disorder.    Admitted 11/30/2019 with witnessed cardiac arrest while doing physical therapy at SNF.  Underwent CPR for 15 minutes including 1 shock.  Code STEMI called patient taken emergently to Cath Lab that demonstrated severe two-vessel disease with CTO of ostial RCA &subacute or chronic total occlusion of mid LAD - PTCA of mid LAD &D2 w/ reestablishment of TIMI-3 flow, stent not attempted due to small vessel size and heavy calcification.  Acute respiratory failure with hypoxemia secondary to cardiac arrest continues on ventilator support.  Shock most likely cardiogenic continues on Levophed.  CRRT initiated 12/14/2019.  Acute encephalopathy unable to assess secondary to sedation needs possible risk for hypoxic brain injury.  Blood pressure 107/61 pulse 82 temperature 95 O2 sats 100% 30% FiO2  IV Levophed  Aspirin 81 mg daily, Lipitor 10 mg daily, Plavix 75 mg daily, insulin sliding scale, Protonix 40 mg daily  Being kept even on CRRT no fluid removed.  We will try to remove 0 to 100 cc an hour.  We will see if she tolerates this.  Sodium 136 potassium 4.6 chloride 103 CO2 27 BUN 24 creatinine 2.13 glucose 135 calcium 8.3 phosphorus 1.9 magnesium 2.5 WBC 11.8 hemoglobin 6.1 platelets 181     Objective:  Vital signs in last 24 hours:  Temp:  [97 F (36.1 C)-100.9 F (38.3 C)] 97.7 F (36.5 C) (12/24 0900) Pulse Rate:  [74-109] 82 (12/24 0900) Resp:  [14-35] 23 (12/24 0900) BP: (93-131)/(45-76) 107/61 (12/24 0900) SpO2:  [94 %-100 %] 100 % (12/24 0900) Arterial Line BP: (88-170)/(37-66) 112/42 (12/24 0900) FiO2 (%):  [30 %] 30 % (12/24 0738) Weight:  [96  kg] 96 kg (12/24 0500)  Weight change: -0.9 kg Filed Weights   12/16/19 0302 12/17/19 0500 12/18/19 0500  Weight: 97.2 kg 96.9 kg 96 kg    Intake/Output: I/O last 3 completed shifts: In: 2067.3 [I.V.:347.3; NG/GT:1720] Out: 2069 [Other:2069]   Intake/Output this shift:  Total I/O In: 413.9 [I.V.:8.9; Blood:315; NG/GT:90] Out: 78 [Other:78]  Gen: intubated and sedated CVS: no rub Resp: occ rhonchi Abd: +BS, soft Ext: trace edema, LUE AVF +T/B, LIJ temp HD catheter   Basic Metabolic Panel: Recent Labs  Lab 12/14/19 0409 12/15/19 0337 12/16/19 0347 12/16/19 1628 12/17/19 0333 12/17/19 1222 12/17/19 1659 12/18/19 0201 12/18/19 0533  NA 136 137 138 137 134* 137 137 137 136  K 4.7 4.8 4.9 4.9 4.6 4.3 4.9 4.6 4.6  CL 100 102 101 103 99  --  102 103  --   CO2 27 26 27 27 27   --  24 27  --   GLUCOSE 159* 125* 106* 135* 145*  --  187* 135*  --   BUN 30* 21 15 14 17   --  24* 24*  --   CREATININE 4.47* 2.86* 2.01* 2.11* 1.92*  --  2.16* 2.13*  --   CALCIUM 7.2* 7.5* 8.1* 8.0* 8.0*  --  8.3* 8.3*  --   MG 2.4 2.4 2.5*  --  2.7*  --   --  2.6*  --   PHOS 2.8 2.6 2.1* 2.2* 1.9*  --  2.2* 1.9*  1.9*  --     Liver Function Tests: Recent Labs  Lab 12/16/19 325 [id number]  12/16/19 1628 12/17/19 0333 12/17/19 1659 12/18/19 0201  ALBUMIN 2.5* 2.3* 2.4* 2.6* 2.5*   No results for input(s): LIPASE, AMYLASE in the last 168 hours. No results for input(s): AMMONIA in the last 168 hours.  CBC: Recent Labs  Lab 12/13/19 0338 12/15/19 0506 12/16/19 0347 12/17/19 0333 12/17/19 1222 12/18/19 0201 12/18/19 0533  WBC 9.5 10.4 7.1 8.5  --  11.8*  --   NEUTROABS  --  6.9 4.6 5.8  --  7.9*  --   HGB 10.3* 8.0* 7.1* 7.0* 8.2* 6.1* 6.8*  HCT 32.4* 25.9* 23.6* 22.2* 24.0* 19.2* 20.0*  MCV 99.7 102.0* 103.1* 102.3*  --  100.5*  --   PLT 248 179 166 171  --  181  --     Cardiac Enzymes: No results for input(s): CKTOTAL, CKMB, CKMBINDEX, TROPONINI in the last 168 hours.  BNP: Invalid  input(s): POCBNP  CBG: Recent Labs  Lab 12/17/19 1620 12/17/19 2004 12/17/19 2343 12/18/19 0412 12/18/19 0914  GLUCAP 193* 151* 145* 133* 152*    Microbiology: Results for orders placed or performed during the hospital encounter of 12/14/2019  MRSA PCR Screening     Status: None   Collection Time: 11/25/2019  4:16 PM   Specimen: Nasopharyngeal  Result Value Ref Range Status   MRSA by PCR NEGATIVE NEGATIVE Final    Comment:        The GeneXpert MRSA Assay (FDA approved for NASAL specimens only), is one component of a comprehensive MRSA colonization surveillance program. It is not intended to diagnose MRSA infection nor to guide or monitor treatment for MRSA infections. Performed at [facility], [place] [address] [place], [place] [address]     Coagulation Studies: No results for input(s): LABPROT, INR in the last 72 hours.  Urinalysis: No results for input(s): COLORURINE, LABSPEC, PHURINE, GLUCOSEU, HGBUR, BILIRUBINUR, KETONESUR, PROTEINUR, UROBILINOGEN, NITRITE, LEUKOCYTESUR in the last 72 hours.  Invalid input(s): APPERANCEUR    Imaging: AM DG Chest Port 1 View  Result Date: 12/18/2019 CLINICAL DATA:  Respiratory failure. EXAM: PORTABLE CHEST 1 VIEW COMPARISON:  12/17/2019 FINDINGS: The endotracheal tube is 3 cm above the carina. The left IJ catheter is stable. The NG tube is coursing down the esophagus and into the stomach. Persistent low lung volumes with vascular crowding and streaky basilar atelectasis. No effusions or pneumothorax. IMPRESSION: 1. Stable support apparatus. 2. Persistent bibasilar atelectasis. Electronically Signed   By: Marijo Sanes M.D.   On: 12/18/2019 08:47   AM DG Chest Port 1 View  Result Date: 12/17/2019 CLINICAL DATA:  ET tube, respiratory failure EXAM: PORTABLE CHEST 1 VIEW COMPARISON:  12/15/2019 FINDINGS: Support devices are unchanged. Low lung volumes with vascular congestion and bibasilar atelectasis. Heart is normal size. No  effusions or pneumothorax. No acute bony abnormality. IMPRESSION: Low lung volumes with vascular congestion and bibasilar atelectasis. Stable support devices. Electronically Signed   By: Rolm Baptise M.D.   On: 12/17/2019 08:33     Medications:   .  prismasol BGK 4/2.5 500 mL/hr at 12/17/19 2311  .  prismasol BGK 4/2.5 300 mL/hr at 12/17/19 0603  . sodium chloride 10 mL/hr at 11/29/2019 1445  . sodium chloride Stopped (12/18/19 0820)  . fentaNYL infusion INTRAVENOUS 30 mcg/hr (12/18/19 0900)  . heparin 10,000 units/ 20 mL infusion syringe 900 Units/hr (12/18/19 0114)  . norepinephrine (LEVOPHED) Adult infusion Stopped (12/18/19 0504)  . prismasol BGK 4/2.5 1,500 mL/hr at 12/18/19 0648   . sodium chloride   Intravenous  Once  . aspirin  81 mg Per NG tube Daily  . atorvastatin  10 mg Per Tube QPM  . B-complex with vitamin C  1 tablet Oral Daily  . chlorhexidine gluconate (MEDLINE KIT)  15 mL Mouth Rinse BID  . Chlorhexidine Gluconate Cloth  6 each Topical Q0600  . clopidogrel  75 mg Per Tube QPM  . feeding supplement (PRO-STAT SUGAR FREE 64)  30 mL Per Tube BID  . feeding supplement (VITAL HIGH PROTEIN)  1,000 mL Per Tube Q24H  . insulin aspart  3-9 Units Subcutaneous Q4H  . liver oil-zinc oxide   Topical BID  . mouth rinse  15 mL Mouth Rinse 10 times per day  . pantoprazole sodium  40 mg Per Tube QHS  . scopolamine  1 patch Transdermal Q72H  . sodium chloride flush  10-40 mL Intracatheter Q12H  . sodium chloride flush  3 mL Intravenous Q12H   sodium chloride, acetaminophen, fentaNYL, heparin, heparin, heparin, ondansetron (ZOFRAN) IV, sodium chloride flush, sodium chloride flush  Assessment/ Plan:  1. STEMI/cardiac arrest- s vessel CAD with PCI with cardiogenic shock requiring pressors. 2. AMS- EEG with severe encephalopathy.  Difficult to assess due to sedation but has nonpurposeful movements according to nurse. 3. Hypotension- requiring pressors 4. ESRD- medically too unstable for  IHD at this time.  CRRT started 12/13/2019.  Family wants aggressive medical care and does not wish to pursue palliative measures at this time. 5. Anemia of CKD stage 5. Stable noted drop in hemoglobin to 6.1.  Transfusion planned per critical care 12/18/2019 6. Combined CHF, chronic 7. SHPTH 8. Vascular access- LUE AVF, right fem tlcs/p LIJtemp HD cath for CVVHDplaced 12/12/19. 9. Hypophosphatemia we will add 20 mEq sodium phosphate 12/18/2019 10. Disposition- pt remains critically ill and overall prognosis is poor.  Agree with ongoing discussions with family regarding goals/limits of care    LOS: [place] @TODAY @9 :17 AM

## 2019-12-18 NOTE — Progress Notes (Signed)
                                                                                                                                                                                                         Daily Progress Note   Patient Name: Kallee M Burnsworth       Date: 12/18/2019 DOB: 03/29/1956  Age: 63 y.o. MRN#: [id number] Attending Physician: Agarwala, Ravi, MD Primary Care Physician: Patient, No Pcp Per Admit Date: 12/07/2019  Reason for Consultation/Follow-up: Establishing goals of care  Subjective: Patient remains intubated. Tolerating CRRT. Has been able to wean off of vasopressors. No family at the bedside. Unable to assess mental status however no response to touch when squeezing hand or other stimuli.   I spoke with patient's son, Maurice via phone and provided updates. We discussed previous goals of care discussion and support given. Maurice expresses "I know my decision and what I have to do for her I just need a little more time!" He reports he has 2 more family members that he is in discussion with and their support and input is valuable to him. He shares his emotional struggle given it is the holidays as well. He reports he is getting some affairs in order to be prepared and need to have these things done before he provides the medical team with his final decision.   Mr. Brodowski verbalizes his appreciation of the care being provided to his mother. He reports again, he has made his decision but is seeking support and acceptance by 2 additional family members which he hopes comes tomorrow or by the weekend and he will then be at peace and comfortable with moving forward. He is aware of his mother's current state of health and knows that given her frailty and the criticalness of her condition she is at risk of sudden death or further decline. He again hopes to connect with other family and gain their support prior to confirming any decisions regarding patient's code status or transitioning to  comfort.   Length of Stay: 7  Current Medications: Scheduled Meds:  . sodium chloride   Intravenous Once  . aspirin  81 mg Per NG tube Daily  . atorvastatin  10 mg Per Tube QPM  . B-complex with vitamin C  1 tablet Oral Daily  . chlorhexidine gluconate (MEDLINE KIT)  15 mL Mouth Rinse BID  . Chlorhexidine Gluconate Cloth  6 each Topical Q0600  . clopidogrel  75 mg Per Tube QPM  . feeding supplement (PRO-STAT SUGAR FREE 64)  30 mL Per Tube   BID  . feeding supplement (VITAL HIGH PROTEIN)  1,000 mL Per Tube Q24H  . insulin aspart  3-9 Units Subcutaneous Q4H  . liver oil-zinc oxide   Topical BID  . mouth rinse  15 mL Mouth Rinse 10 times per day  . pantoprazole sodium  40 mg Per Tube QHS  . scopolamine  1 patch Transdermal Q72H  . sodium chloride flush  10-40 mL Intracatheter Q12H  . sodium chloride flush  3 mL Intravenous Q12H    Continuous Infusions: .  prismasol BGK 4/2.5 500 mL/hr at 12/18/19 1542  .  prismasol BGK 4/2.5 300 mL/hr at 12/18/19 1542  . sodium chloride 10 mL/hr at 11/28/2019 1445  . sodium chloride Stopped (12/18/19 0820)  . fentaNYL infusion INTRAVENOUS 30 mcg/hr (12/18/19 1500)  . heparin 10,000 units/ 20 mL infusion syringe 900 Units/hr (12/18/19 1106)  . norepinephrine (LEVOPHED) Adult infusion Stopped (12/18/19 0504)  . prismasol BGK 4/2.5 1,500 mL/hr at 12/18/19 1336  . sodium phosphate  Dextrose 5% IVPB 43 mL/hr at 12/18/19 1500    PRN Meds: sodium chloride, acetaminophen, fentaNYL, heparin, heparin, heparin, ondansetron (ZOFRAN) IV, sodium chloride flush, sodium chloride flush     Vital Signs: BP 117/60   Pulse (!) 103   Temp 99 F (37.2 C)   Resp (!) 30   Ht 5' 5" (1.651 m)   Wt 96 kg   SpO2 100%   BMI 35.22 kg/m  SpO2: SpO2: 100 % O2 Device: O2 Device: Ventilator O2 Flow Rate:    Intake/output summary:   Intake/Output Summary (Last 24 hours) at 12/18/2019 1558 Last data filed at 12/18/2019 1500 Gross per 24 hour  Intake 1670.32 ml    Output 1517 ml  Net 153.32 ml   LBM: Last BM Date: (UTA) Baseline Weight: Weight: 101 kg Most recent weight: Weight: 96 kg       Palliative Assessment/Data:      Patient Active Problem List   Diagnosis Date Noted  . Criger QT interval 12/14/2019  . CAD (coronary artery disease) 12/14/2019  . End-stage renal disease on hemodialysis ([place])   . Palliative care by specialist   . COVID-19 virus infection 10/25/2019  . Adjustment disorder with disturbance of conduct   . Benign hypertension with ESRD (end-stage renal disease) ([place]) 02/12/2018  . Uncontrolled type 2 diabetes mellitus with end-stage renal disease ([place]) 11/26/2017  . 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 Chronic combined systolic and diastolic congestive heart failure ([place]) 12/15/2015  . Dyslipidemia associated with type 2 diabetes mellitus ([place]) 12/15/2015  . Ventricular tachycardia, sustained ([place]) 12/15/2015  . Psychosis, paranoid ([place]) 08/20/2015  . Non compliance w medication regimen 08/20/2015  . Hyperkalemia 04/19/2015  . ESRD (end stage renal disease) ([place]) 04/19/2015  . PAD (peripheral artery disease) ([place]) 03/23/2015  . Acute encephalopathy   . FTT (failure to thrive) in adult 01/14/2015  . DNR (do not resuscitate) discussion 12/16/2014  . Blindness with deafness 12/16/2014  . Anemia in CKD (chronic kidney disease) 12/14/2014  . Cardiac arrest ([place]) 12/12/2014  . ST elevation myocardial infarction (STEMI) ([place]) 11/21/2014  . Cardiomyopathy, ischemic-EF 30-35% 11/21/2014  . Chronic constipation 12/24/2013  . Diabetes mellitus with ESRD (end-stage renal disease) ([place]) November 13, 202014  . Acute respiratory failure with hypoxia ([place]) 12/22/2013  . Hypertensive heart disease with CHF (congestive heart failure) ([place]) 08/09/2009    Palliative Care Assessment & Plan   Recommendations/Plan:  Full Code  Continue current plan of care per medical team  Son requesting to continue with discussions  with family members. States he  knows his decision however he needs family's support. Leaning towards comfort.   PMT will continue to support and follow.   Goals of Care and Additional Recommendations:  Limitations on Scope of Treatment: Full Scope Treatment  Code Status:    Code Status Orders  (From admission, onward)         Start     Ordered   12/13/2019 1346  Full code  Continuous     12/17/2019 1352        Code Status History    Date Active Date Inactive Code Status Order ID Comments User Context   10/25/2019 1109 10/29/2019 2341 Full Code [id number]  Norval Morton, MD ED   05/01/2018 0025 05/07/2018 2212 Full Code [id number]  [name], MD ED   08/20/2015 1659 08/23/2015 2233 Full Code [id number]  Newt Minion, MD Inpatient   08/19/2015 1834 08/20/2015 1659 Full Code [id number]  Lacretia Leigh, MD ED   05/17/2015 2031 05/18/2015 1823 Full Code [id number]  Newt Minion, MD Inpatient   05/16/2015 1020 05/17/2015 2031 Full Code [id number]  Janece Canterbury, MD Inpatient   05/16/2015 0845 05/16/2015 1020 DNR [id number]  Janece Canterbury, MD Inpatient   04/19/2015 0820 04/21/2015 2255 DNR [id number]  Delfina Redwood, MD Inpatient   04/19/2015 0719 04/19/2015 0820 Full Code [id number]  Jani Gravel, MD Inpatient   01/13/2015 1358 01/16/2015 2104 Full Code [id number]  Phillips Climes, MD Inpatient   12/12/2014 0314 12/21/2014 1720 Full Code [id number]  Berle Mull, MD Inpatient   11/20/2014 0907 11/26/2014 1806 Full Code [id number]  Ivor Costa, MD Inpatient   12/06/202014 0221 01/08/2014 1840 Full Code [id number]  Bynum Bellows, MD Inpatient   Advance Care Planning Activity       Prognosis:  Poor   Discharge Planning:  To Be Determined  Care plan was discussed with patient's son.   Thank you for allowing the Palliative Medicine Team to assist in the care of this patient.  Total Time: 45 min.   Greater than 50%  of this time was spent counseling and coordinating care related to the above assessment and  plan.  Alda Lea, AGPCNP-BC Palliative Medicine Team   Please contact Palliative Medicine Team phone at [phone] for questions and concerns.

## 2019-12-18 NOTE — Progress Notes (Signed)
[facility] Progress Note Patient Name: VICTORINE MCNEE DOB: [birth year] MRN: [id number]   Date of Service  12/18/2019  HPI/Events of Note  Hemoglobin 6.1, no overt signs of hemorrhage, Pt is on CRRT.  eICU Interventions  Transfuse 1 unit PRBC        Brandom Kerwin U Kai [place] 12/18/2019, 2:51 AM

## 2019-12-19 LAB — POCT ACTIVATED CLOTTING TIME
Activated Clotting Time: 158 seconds
Activated Clotting Time: 158 seconds
Activated Clotting Time: 164 seconds
Activated Clotting Time: 164 seconds
Activated Clotting Time: 164 seconds
Activated Clotting Time: 169 seconds

## 2019-12-19 LAB — RENAL FUNCTION PANEL
Albumin: 2.7 g/dL — ABNORMAL LOW (ref 3.5–5.0)
Albumin: 2.7 g/dL — ABNORMAL LOW (ref 3.5–5.0)
Anion gap: 12 (ref 5–15)
Anion gap: 9 (ref 5–15)
BUN: 28 mg/dL — ABNORMAL HIGH (ref 8–23)
BUN: 26 mg/dL — ABNORMAL HIGH (ref 8–23)
CO2: 27 mmol/L (ref 22–32)
CO2: 28 mmol/L (ref 22–32)
Calcium: 8.8 mg/dL — ABNORMAL LOW (ref 8.9–10.3)
Calcium: 8.6 mg/dL — ABNORMAL LOW (ref 8.9–10.3)
Chloride: 99 mmol/L (ref 98–111)
Chloride: 102 mmol/L (ref 98–111)
Creatinine, Ser: 1.88 mg/dL — ABNORMAL HIGH (ref 0.44–1.00)
Creatinine, Ser: 1.72 mg/dL — ABNORMAL HIGH (ref 0.44–1.00)
GFR calc Af Amer: 32 mL/min — ABNORMAL LOW (ref >60)
GFR calc Af Amer: 36 mL/min — ABNORMAL LOW (ref >60)
GFR calc non Af Amer: 28 mL/min — ABNORMAL LOW (ref >60)
GFR calc non Af Amer: 31 mL/min — ABNORMAL LOW (ref >60)
Glucose, Bld: 162 mg/dL — ABNORMAL HIGH (ref 70–99)
Glucose, Bld: 151 mg/dL — ABNORMAL HIGH (ref 70–99)
Phosphorus: 1.8 mg/dL — ABNORMAL LOW (ref 2.5–4.6)
Phosphorus: 3 mg/dL (ref 2.5–4.6)
Potassium: 4.1 mmol/L (ref 3.5–5.1)
Potassium: 4.1 mmol/L (ref 3.5–5.1)
Sodium: 138 mmol/L (ref 135–145)
Sodium: 139 mmol/L (ref 135–145)

## 2019-12-19 LAB — GLUCOSE, CAPILLARY
Glucose-Capillary: 122 mg/dL — ABNORMAL HIGH (ref 70–99)
Glucose-Capillary: 158 mg/dL — ABNORMAL HIGH (ref 70–99)
Glucose-Capillary: 159 mg/dL — ABNORMAL HIGH (ref 70–99)
Glucose-Capillary: 160 mg/dL — ABNORMAL HIGH (ref 70–99)
Glucose-Capillary: 162 mg/dL — ABNORMAL HIGH (ref 70–99)
Glucose-Capillary: 163 mg/dL — ABNORMAL HIGH (ref 70–99)
Glucose-Capillary: 70 mg/dL (ref 70–99)

## 2019-12-19 LAB — CBC
HCT: 22 % — ABNORMAL LOW (ref 36.0–46.0)
Hemoglobin: 7 g/dL — ABNORMAL LOW (ref 12.0–15.0)
MCH: 31.4 pg (ref 26.0–34.0)
MCHC: 31.8 g/dL (ref 30.0–36.0)
MCV: 98.7 fL (ref 80.0–100.0)
Platelets: 191 10*3/uL (ref 150–400)
RBC: 2.23 MIL/uL — ABNORMAL LOW (ref 3.87–5.11)
RDW: 19.7 % — ABNORMAL HIGH (ref 11.5–15.5)
WBC: 13 10*3/uL — ABNORMAL HIGH (ref 4.0–10.5)
nRBC: 2.8 % — ABNORMAL HIGH (ref 0.0–0.2)

## 2019-12-19 LAB — APTT: aPTT: 76 seconds — ABNORMAL HIGH (ref 24–36)

## 2019-12-19 LAB — MAGNESIUM: Magnesium: 2.8 mg/dL — ABNORMAL HIGH (ref 1.7–2.4)

## 2019-12-19 MED ORDER — SODIUM PHOSPHATES 45 MMOLE/15ML IV SOLN
30.0000 mmol | Freq: Once | INTRAVENOUS | Status: AC
  Administered 2019-12-19: 30 mmol via INTRAVENOUS
  Filled 2019-12-19 (×2): qty 10

## 2019-12-19 NOTE — Progress Notes (Addendum)
Patient ID: [name], female   DOB: [birth year], 63 y.o.   MRN: [id number] [facility] Progress Note   Assessment/ Plan:   1.  S/p cardiac arrest with STEMI and cardiogenic shock: Previously on pressors that have been weaned off.  Status post coronary angiography with PCI in the setting of diffuse/calcified coronary disease.  Currently hemodynamically stable.  With evidence of significant anoxic/hypoxic encephalopathy. 2. ESRD: Currently on CRRT that will be discontinued later today or if filter threatens to clot off.  She may be transitioned thereafter to IHD.  Overall prognosis appears poor with significant anoxic encephalopathy.  Previous notes reviewed including CCM/palliative care with family wanting continued aggressive medical care. 3. Anemia: Status post PRBC transfusion with stable hemoglobin/hematocrit, no overt loss. 4. CKD-MBD: With low phosphorus level secondary to CRRT losses, will replace intravenously/via enteral route. 5. Nutrition: Currently on tube feeds, monitor after discontinuation of CRRT for need to limit fluid intake.  Subjective:   Without acute events overnight, weaned off of pressors.   Objective:   BP (!) 125/58   Pulse 98   Temp 97.7 F (36.5 C) (Esophageal)   Resp (!) 28   Ht _0  (1.651 m)   Wt 99.8 kg   SpO2 100%   BMI 36.61 kg/m   Physical Exam: Gen: Intubated, spontaneous movements noted but no meaningful response (patient deaf/mute). CVS: Pulse regular rhythm, normal rate, S1 and S2 normal Resp: Anteriorly clear to auscultation, no rales, left IJ dialysis catheter in situ. Abd: Soft, flat, nontender Ext: Bilateral lower extremities in protective booties, left upper arm AVF with palpable thrill  Labs: BMET Recent Labs  Lab 12/16/19 0347 12/16/19 1628 12/17/19 0333 12/17/19 1222 12/17/19 1659 12/18/19 0201 12/18/19 0533 12/18/19 1603 12/19/19 0505  NA 138 137 134* 137 137 137 136 137 138  K 4.9 4.9 4.6 4.3 4.9 4.6 4.6  4.4 4.1  CL 101 103 99  --  102 103  --  100 99  CO2 _1 --  24 27  --  26 27  GLUCOSE 106* 135* 145*  --  187* 135*  --  183* 162*  BUN _2 --  24* 24*  --  26* 28*  CREATININE 2.01* 2.11* 1.92*  --  2.16* 2.13*  --  1.99* 1.88*  CALCIUM 8.1* 8.0* 8.0*  --  8.3* 8.3*  --  8.4* 8.8*  PHOS 2.1* 2.2* 1.9*  --  2.2* 1.9*  1.9*  --  2.6 1.8*   CBC Recent Labs  Lab 12/15/19 0506 12/16/19 0347 12/17/19 0333 12/18/19 0201 12/18/19 0533 12/18/19 1603 12/19/19 0505  WBC 10.4 7.1 8.5 11.8*  --  13.2* 13.0*  NEUTROABS 6.9 4.6 5.8 7.9*  --   --   --   HGB 8.0* 7.1* 7.0* 6.1* 6.8* 7.4* 7.0*  HCT 25.9* 23.6* 22.2* 19.2* 20.0* 23.0* 22.0*  MCV 102.0* 103.1* 102.3* 100.5*  --  98.3 98.7  PLT 179 166 171 181  --  198 191      Medications:    . sodium chloride   Intravenous Once  . aspirin  81 mg Per NG tube Daily  . atorvastatin  10 mg Per Tube QPM  . B-complex with vitamin C  1 tablet Oral Daily  . chlorhexidine gluconate (MEDLINE KIT)  15 mL Mouth Rinse BID  . Chlorhexidine Gluconate Cloth  6 each Topical Q0600  . clopidogrel  75 mg Per Tube QPM  . feeding supplement (  PRO-STAT SUGAR FREE 64)  30 mL Per Tube BID  . feeding supplement (VITAL HIGH PROTEIN)  1,000 mL Per Tube Q24H  . insulin aspart  3-9 Units Subcutaneous Q4H  . liver oil-zinc oxide   Topical BID  . mouth rinse  15 mL Mouth Rinse 10 times per day  . pantoprazole sodium  40 mg Per Tube QHS  . scopolamine  1 patch Transdermal Q72H  . sodium chloride flush  10-40 mL Intracatheter Q12H  . sodium chloride flush  3 mL Intravenous Q12H   Elmarie Shiley, MD 12/19/2019, 7:24 AM

## 2019-12-19 NOTE — Progress Notes (Signed)
changed back to full vent support d/t increased WOB and increased RR noted.  VSS currently, sat 98%

## 2019-12-19 NOTE — Progress Notes (Deleted)
NAME:  [name], MRN:  [id number], DOB:  [birth year], LOS: 8 ADMISSION DATE:  12/18/2019, CONSULTATION DATE: 11/27/2019 REFERRING MD: Dr. Saunders Revel, Cardiology, CHIEF COMPLAINT: Cardiac arrest, acute respiratory failure  Brief History   10, DM, CAD with CM, COVID-19 pneumonia early November.  Admitted 12/17 from SNF after acute arrest, presumed ventricular arrhythmia.  CPR 15 minutes. Partially successful PTCI LAD 12/17, no stent.  To ICU for TTM 33.   Past Medical History   has a past medical history of Anemia, Anemia in CKD (chronic kidney disease) (12/14/2014), Benign hypertension with ESRD (end-stage renal disease) ([place]) (02/12/2018), Bilateral leg edema, Blind, Cardiac arrest ([place]) (12/12/2014), CHF (congestive heart failure) ([place]), Coronary artery disease, Deaf, Diabetes mellitus, ESRD on hemodialysis ([place]), Essential hypertension (08/09/2009), Fibroid uterus, Heart murmur, Hyperlipidemia, Hypertension, Leiomyoma of uterus (08/09/2009), MI (myocardial infarction) ([place]) (11/2014), Moderate tricuspid regurgitation, Osteomyelitis ([place]), Psychiatric disorder, and Pulmonary hypertension ([place]).   Significant Hospital Events   Cardiac arrest 12/17 Cardiac catheterization 12/17 12/19 -  eeg stopped. On CRRt. On fent gtt. On levophed gtt. Off Nimbex. Rewarmed early hours today. CRRT started 12/21> remains on pressors. Remains on CRRT (net even due to shock). Remains intubated and sedated.  12/16/2019 palliative care is following will sign Consults:  Cardiology signed off 12/17/2019 Nephrology PCCM Palliative Procedures:  Left heart catheterization 12/17 >>   Significant Diagnostic Tests:   EEG 12/17 > profound severe encephalopathy, non specific to etiology. No seizures or epileptiform discharges   TTE 12/17 >> LVEF < 20%. LV dilated with akinesis of mid and apical segments. No LV thrombus. Grade II LV diastolic dysfunction.   Micro Data:    Antimicrobials:    Interim  history/subjective:  No significant change.  She is off vasopressor support.  Transitioning to intermittent hemodialysis  Objective   Blood pressure (!) 125/58, pulse 98, temperature 97.7 F (36.5 C), temperature source Esophageal, resp. rate (!) 28, height 5\' 5"  (1.651 m), weight 99.8 kg, SpO2 100 %. CVP:  [10 mmHg-15 mmHg] 10 mmHg  Vent Mode: PRVC FiO2 (%):  [30 %] 30 % Set Rate:  [12 bmp] 12 bmp Vt Set:  [450 mL] 450 mL PEEP:  [5 cmH20] 5 cmH20 Pressure Support:  [10 cmH20] 10 cmH20 Plateau Pressure:  [21 cmH20-22 cmH20] 22 cmH20   Intake/Output Summary (Last 24 hours) at 12/19/2019 0746 Last data filed at 12/19/2019 0700 Gross per 24 hour  Intake 1848.78 ml  Output 1681 ml  Net 167.78 ml   Filed Weights   12/17/19 0500 12/18/19 0500 12/19/19 0500  Weight: 96.9 kg 96 kg 99.8 kg    General: Elderly obese female poorly responsive HEENT: Endotracheal tube gastric tube are in place Neuro: Noted to move spontaneously but does not follow commands but she is deaf and mute and blind CV: Heart sounds regular regular rate and rhythm PULM: Diminished in the bases Vent pressure regulated volume control FIO2 30% PEEP 5 RATE 12+4 VT 450 cc  GI: soft, bsx4 active  GU: Does not produce urine Extremities: Bilateral lower extremities with metatarsal amputation Skin: no rashes or lesions     Resolved Hospital Problem list     Assessment & Plan:   Acute encephalopathy -toxic metabolic. At risk for anoxic injury in setting of cardiac arrest  12/13/2019 - did follow command x1. Unable to assess 12/20 due to sedation needs -Diffuse slowing on EEG suggestive of severe encephalopathy.  This was done on 12/13/2019 She is noted to be blind, deaf and mute P  Wean sedation Palliative care as needed again we wait for family for guidance on their wishes.  Acute respiratory failure with hypoxemia due to cardiac arrest -failed SBT 12/21 P She tolerates pressure support but due to her  mental status being altered complicated by being blind deaf and mute any type of extubation would be risky at this time. We await family input as to goals of care spoke at length with son and Marie's on 12/18/2019   S/p Cardiac Arrest -presumed VT/VF given her history of CAD and prior ventricular arrhythmias, shockable initial rhythm with AED. - s/p 74 TTM with rewarming P Supportive care   Anemia Recent Labs    12/18/19 1603 12/19/19 0505  HGB 7.4* 7.0*   P Transfuse per protocol note she required a unit of packed red blood cells 12/18/2019  Shock -likely cardiogenic in setting of CAD, STEMI, Cardiac arrest -possible component of sedation related hypotension  P Resolved she is currently off all vasopressor support   CAD Acute STEMI -Post cardiac catheterization with attempted PTCI to LAD, only partially successful, no stent placed  Severe ICM -LVEF < 20% P Cardiology signed off as there is no further interventions that are applicable in this case She is now 8 days out from cardiac arrest and 6 days post rewarming. Currently off vasopressor support maintaining adequate blood pressure at this time    Hx HTN We will continue to hold antihypertensives at this point  End-stage renal disease, chronic dialysis Plan Currently on CRRT spoke with nephrology to convert to intermittent hemodialysis 12/19/2019 note she is off vasopressor support  Diabetes mellitus CBG (last 3)  Recent Labs    12/18/19 2355 12/19/19 0406 12/19/19 0743  GLUCAP 159* 163* 122*    Continue sliding scale insulin coverage noted to be well controlled at this time of 12/19/2019  Chronic sacral pressure injury -present prior to admission P Continue care as directed by the wound ostomy team   Best practice:  Diet: TF Pain/Anxiety/Delirium protocol (if indicated): Versed, fentanyl VAP protocol (if indicated): Yes DVT prophylaxis: heparin via crrt circuit  GI prophylaxis: PPI Glucose  control: SSI  Mobility: BR Code Status: Full Family Communication: Spoke with son Daphne Karrer 12/18/2019.  He is still engaging other family members in conversation about goals of care for his mother.  He was updated at length and expressed appreciation for the update.  For now we will continue aggressive care and we await further input from him and family members.  12/19/2019 await further input from family concerning goals of care. Disposition: ICU   LABS    PULMONARY Recent Labs  Lab 12/12/19 0802 12/13/19 0350 12/13/19 0611 12/13/19 0806 12/13/19 1001 12/13/19 1153 12/17/19 1222 12/18/19 0533  PHART 7.535* 7.355 7.367 7.360  --   --   --  7.457*  PCO2ART 35.2 51.4* 47.6 50.3*  --   --   --  39.2  PO2ART 122.0* 84.0 75.0* 86.0  --   --   --  87.0  HCO3 30.9* 29.1* 27.8 28.5*  --   --  29.0* 27.7  TCO2 32 31 29 30 27 26 30 29   O2SAT 99.0 96.0 95.0 96.0  --   --  78.0 97.0    CBC Recent Labs  Lab 12/18/19 0201 12/18/19 0533 12/18/19 1603 12/19/19 0505  HGB 6.1* 6.8* 7.4* 7.0*  HCT 19.2* 20.0* 23.0* 22.0*  WBC 11.8*  --  13.2* 13.0*  PLT 181  --  198 191    COAGULATION  No results for input(s): INR in the last 168 hours.  CARDIAC  No results for input(s): TROPONINI in the last 168 hours. No results for input(s): PROBNP in the last 168 hours.   CHEMISTRY Recent Labs  Lab 12/15/19 0337 12/16/19 0347 12/17/19 0333 12/17/19 1659 12/18/19 0201 12/18/19 0533 12/18/19 1603 12/19/19 0505  NA 137 138 134* 137 137 136 137 138  K 4.8 4.9 4.6 4.9 4.6 4.6 4.4 4.1  CL 102 101 99 102 103  --  100 99  CO2 26 27 27 24 27   --  26 27  GLUCOSE 125* 106* 145* 187* 135*  --  183* 162*  BUN 21 15 17  24* 24*  --  26* 28*  CREATININE 2.86* 2.01* 1.92* 2.16* 2.13*  --  1.99* 1.88*  CALCIUM 7.5* 8.1* 8.0* 8.3* 8.3*  --  8.4* 8.8*  MG 2.4 2.5* 2.7*  --  2.6*  --   --  2.8*  PHOS 2.6 2.1* 1.9* 2.2* 1.9*  1.9*  --  2.6 1.8*   Estimated Creatinine Clearance: 35.8 mL/min (A)  (by C-G formula based on SCr of 1.88 mg/dL (H)).   LIVER Recent Labs  Lab 12/17/19 0333 12/17/19 1659 12/18/19 0201 12/18/19 1603 12/19/19 0505  ALBUMIN 2.4* 2.6* 2.5* 2.8* 2.7*     INFECTIOUS No results for input(s): LATICACIDVEN, PROCALCITON in the last 168 hours.   ENDOCRINE CBG (last 3)  Recent Labs    12/18/19 2355 12/19/19 0406 12/19/19 0743  GLUCAP 159* 163* 122*    IMAGING x48h  - image(s) personally visualized  -   highlighted in bold AM DG Chest Port 1 View  Result Date: 12/18/2019 CLINICAL DATA:  Respiratory failure. EXAM: PORTABLE CHEST 1 VIEW COMPARISON:  12/17/2019 FINDINGS: The endotracheal tube is 3 cm above the carina. The left IJ catheter is stable. The NG tube is coursing down the esophagus and into the stomach. Persistent low lung volumes with vascular crowding and streaky basilar atelectasis. No effusions or pneumothorax. IMPRESSION: 1. Stable support apparatus. 2. Persistent bibasilar atelectasis. Electronically Signed   By: Marijo Sanes M.D.   On: 12/18/2019 08:47     App cct 30 min   Richardson Landry Charae Depaolis ACNP Acute Care Nurse Practitioner [facility] Please consult [place] 12/19/2019, 7:46 AM

## 2019-12-19 NOTE — Progress Notes (Signed)
NAME:  [name], MRN:  [id number], DOB:  February 25, 1956, LOS: 8 ADMISSION DATE:  11/28/2019, CONSULTATION DATE: 12/17/2019 REFERRING MD: Dr. Saunders Revel, Cardiology, CHIEF COMPLAINT: Cardiac arrest, acute respiratory failure  Brief History   81, DM, CAD with CM, COVID-19 pneumonia early November.  Admitted 12/17 from SNF after acute arrest, presumed ventricular arrhythmia.  CPR 15 minutes. Partially successful PTCI LAD 12/17, no stent.  To ICU for TTM 33.   Past Medical History   has a past medical history of Anemia, Anemia in CKD (chronic kidney disease) (12/14/2014), Benign hypertension with ESRD (end-stage renal disease) ([place]) (02/12/2018), Bilateral leg edema, Blind, Cardiac arrest ([place]) (12/12/2014), CHF (congestive heart failure) ([place]), Coronary artery disease, Deaf, Diabetes mellitus, ESRD on hemodialysis ([place]), Essential hypertension (08/09/2009), Fibroid uterus, Heart murmur, Hyperlipidemia, Hypertension, Leiomyoma of uterus (08/09/2009), MI (myocardial infarction) ([place]) (11/2014), Moderate tricuspid regurgitation, Osteomyelitis ([place]), Psychiatric disorder, and Pulmonary hypertension ([place]).   Significant Hospital Events   Cardiac arrest 12/17 Cardiac catheterization 12/17 12/19 -  eeg stopped. On CRRt. On fent gtt. On levophed gtt. Off Nimbex. Rewarmed early hours today. CRRT started 12/21> remains on pressors. Remains on CRRT (net even due to shock). Remains intubated and sedated.  12/16/2019 palliative care is following will sign Consults:  Cardiology signed off 12/17/2019 Nephrology PCCM Palliative Procedures:  Left heart catheterization 12/17 >>   Significant Diagnostic Tests:   EEG 12/17 > profound severe encephalopathy, non specific to etiology. No seizures or epileptiform discharges   TTE 12/17 >> LVEF < 20%. LV dilated with akinesis of mid and apical segments. No LV thrombus. Grade II LV diastolic dysfunction.   Micro Data:    Antimicrobials:    Interim  history/subjective:  Remains intubated continue to await son's input as to goals of care.  More active at times.  Becomes agitated more so when care provided without prior warning by gentle tap.  Objective   Blood pressure (!) 92/46, pulse 70, temperature (!) 96.3 F (35.7 C), resp. rate 19, height 5\' 5"  (1.651 m), weight 99.8 kg, SpO2 100 %. CVP:  [10 mmHg-12 mmHg] 11 mmHg  Vent Mode: PRVC FiO2 (%):  [30 %] 30 % Set Rate:  [12 bmp] 12 bmp Vt Set:  [450 mL] 450 mL PEEP:  [5 cmH20] 5 cmH20 Pressure Support:  [18 cmH20] 18 cmH20 Plateau Pressure:  [21 cmH20-22 cmH20] 22 cmH20   Intake/Output Summary (Last 24 hours) at 12/19/2019 1344 Last data filed at 12/19/2019 1300 Gross per 24 hour  Intake 1545.17 ml  Output 1924 ml  Net -378.83 ml   Filed Weights   12/17/19 0500 12/18/19 0500 12/19/19 0500  Weight: 96.9 kg 96 kg 99.8 kg    General: Elderly female who is poorly responsive but does become agitated when stimulated HEENT: Endotracheal tube gastric tube in place Neuro: Does not follow commands but she is deaf mute and blind CV: Heart sounds are regular regular rate rhythm PULM: Mild rhonchi Vent pressure support of 12 with a tidal volume of 420 FIO2 40 PEEP 5 RATE spontaneous 14 VT approximately 420 cc  GI: soft, bsx4 active  GU: Does not make urine Extremities: 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 At risk for anoxic injury in setting of cardiac arrest  12/13/2019 - did follow command x1. Unable to assess 12/20 due to sedation needs -Diffuse slowing on EEG  suggestive of severe encephalopathy.  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  Her mental status precludes extubation at this time. Wean off all sedation Could give consideration to attempted extubation and reintubation if fails.  The sinus continued to try to make a decision concerning extubation.   S/p Cardiac Arrest -presumed VT/VF given her history of CAD and prior ventricular arrhythmias, shockable initial rhythm with AED. - s/p 33 TTM with rewarming P Continue supportive care   Anemia Recent Labs    12/18/19 1603 12/19/19 0505  HGB 7.4* 7.0*   P Transfuse per protocol  Shock -likely cardiogenic in setting of CAD, STEMI, Cardiac arrest -possible component of sedation related hypotension  P Currently off pressors   CAD Acute STEMI -Post cardiac catheterization with attempted PTCI to LAD, only partially successful, no stent placed  Severe ICM -LVEF < 20% P Cardiology has signed off there is no further interventions available For chance of any meaningful recovery We continue to have ongoing discussions via palliative care concerning goals of care. Her son [name] is in charge of goals of care.    Hx HTN Holding antihypertensive  End-stage renal disease, chronic dialysis Plan CRRT per nephrology she is now off pressors she may be able to transition to intermittent hemodialysis we will leave this up to the nephrology team.  Diabetes mellitus CBG (last 3)  Recent Labs    12/19/19 0406 12/19/19 0743 12/19/19 1127  GLUCAP 163* 122* 162*    Sliding-scale insulin coverage with adequate coverage at this time  Chronic sacral pressure injury -present prior to admission P Continue to care as directed by wound ostomy team   Best practice:  Diet: TF Pain/Anxiety/Delirium protocol (if indicated): Versed, fentanyl VAP protocol (if indicated): Yes DVT prophylaxis: heparin via crrt circuit  GI prophylaxis: PPI Glucose control: SSI  Mobility: BR Code Status: Full Family Communication: Spoke with son  [name] 12/18/2019.  He is still engaging other family members in conversation about goals of care for his mother.  He was updated at length and expressed appreciation for the update.  For now we will continue aggressive care and we await further input from him and family members. Disposition: ICU   LABS    PULMONARY Recent Labs  Lab 12/13/19 0350 12/13/19 0611 12/13/19 0806 12/13/19 1001 12/13/19 1153 12/17/19 1222 12/18/19 0533  PHART 7.355 7.367 7.360  --   --   --  7.457*  PCO2ART 51.4* 47.6 50.3*  --   --   --  39.2  PO2ART 84.0 75.0* 86.0  --   --   --  87.0  HCO3 29.1* 27.8 28.5*  --   --  29.0* 27.7  TCO2 31 29 30 27 26 30 29   O2SAT 96.0 95.0 96.0  --   --  78.0 97.0    CBC Recent Labs  Lab 12/18/19 0201 12/18/19 0533 12/18/19 1603 12/19/19 0505  HGB 6.1* 6.8* 7.4* 7.0*  HCT 19.2* 20.0* 23.0* 22.0*  WBC 11.8*  --  13.2* 13.0*  PLT 181  --  198 191    COAGULATION No results for input(s): INR in the last 168 hours.  CARDIAC  No results for input(s): TROPONINI in the last 168 hours. No results for input(s): PROBNP in the last 168 hours.  CHEMISTRY Recent Labs  Lab 12/15/19 0337 12/16/19 0347 12/17/19 0333 12/17/19 1659 12/18/19 0201 12/18/19 0533 12/18/19 1603 12/19/19 0505  NA 137 138 134* 137 137 136 137 138  K 4.8 4.9 4.6 4.9 4.6 4.6 4.4 4.1  CL 102 101 99 102 103  --  100 99  CO2 26 27 27 24 27   --  26 27  GLUCOSE 125* 106* 145* 187* 135*  --  183* 162*  BUN 21 15 17  24* 24*  --  26* 28*  CREATININE 2.86* 2.01* 1.92* 2.16* 2.13*  --  1.99* 1.88*  CALCIUM 7.5* 8.1* 8.0* 8.3* 8.3*  --  8.4* 8.8*  MG 2.4 2.5* 2.7*  --  2.6*  --   --  2.8*  PHOS 2.6 2.1* 1.9* 2.2* 1.9*  1.9*  --  2.6 1.8*   Estimated Creatinine Clearance: 35.8 mL/min (A) (by C-G formula based on SCr of 1.88 mg/dL (H)).   LIVER Recent Labs  Lab 12/17/19 0333 12/17/19 1659 12/18/19 0201 12/18/19 1603 12/19/19 0505  ALBUMIN 2.4* 2.6* 2.5* 2.8* 2.7*      INFECTIOUS No results for input(s): LATICACIDVEN, PROCALCITON in the last 168 hours.   ENDOCRINE CBG (last 3)  Recent Labs    12/19/19 0406 12/19/19 0743 12/19/19 1127  GLUCAP 163* 122* 162*    IMAGING x48h  - image(s) personally visualized  -   highlighted in bold AM DG Chest Port 1 View  Result Date: 12/18/2019 CLINICAL DATA:  Respiratory failure. EXAM: PORTABLE CHEST 1 VIEW COMPARISON:  12/17/2019 FINDINGS: The endotracheal tube is 3 cm above the carina. The left IJ catheter is stable. The NG tube is coursing down the esophagus and into the stomach. Persistent low lung volumes with vascular crowding and streaky basilar atelectasis. No effusions or pneumothorax. IMPRESSION: 1. Stable support apparatus. 2. Persistent bibasilar atelectasis. Electronically Signed   By: Marijo Sanes M.D.   On: 12/18/2019 08:47    CRITICAL CARE Performed by: Kipp Brood   Total critical care time: 35 minutes  Critical care time was exclusive of separately billable procedures and treating other patients.  Critical care was necessary to treat or prevent imminent or life-threatening deterioration.  Critical care was time spent personally by me on the following activities: development of treatment plan with patient and/or surrogate as well as nursing, discussions with consultants, evaluation of patient's response to treatment, examination of patient, obtaining history from patient or surrogate, ordering and performing treatments and interventions, ordering and review of laboratory studies, ordering and review of radiographic studies, pulse oximetry, re-evaluation of patient's condition and participation in multidisciplinary rounds.  Kipp Brood, MD [facility] ICU Physician [place]  Pager: [phone] Mobile: [phone] After hours: [phone].   12/19/2019, 1:44 PM

## 2019-12-20 ENCOUNTER — Inpatient Hospital Stay (HOSPITAL_COMMUNITY): Payer: Medicare Other

## 2019-12-20 DIAGNOSIS — Z515 Encounter for palliative care: Secondary | ICD-10-CM

## 2019-12-20 DIAGNOSIS — Z7189 Other specified counseling: Secondary | ICD-10-CM

## 2019-12-20 LAB — CBC
HCT: 21.2 % — ABNORMAL LOW (ref 36.0–46.0)
Hemoglobin: 6.7 g/dL — CL (ref 12.0–15.0)
MCH: 30.9 pg (ref 26.0–34.0)
MCHC: 31.6 g/dL (ref 30.0–36.0)
MCV: 97.7 fL (ref 80.0–100.0)
Platelets: 193 10*3/uL (ref 150–400)
RBC: 2.17 MIL/uL — ABNORMAL LOW (ref 3.87–5.11)
RDW: 18.6 % — ABNORMAL HIGH (ref 11.5–15.5)
WBC: 13.7 10*3/uL — ABNORMAL HIGH (ref 4.0–10.5)
nRBC: 6.8 % — ABNORMAL HIGH (ref 0.0–0.2)

## 2019-12-20 LAB — RENAL FUNCTION PANEL
Albumin: 2.5 g/dL — ABNORMAL LOW (ref 3.5–5.0)
Albumin: 2.5 g/dL — ABNORMAL LOW (ref 3.5–5.0)
Anion gap: 10 (ref 5–15)
Anion gap: 12 (ref 5–15)
BUN: 47 mg/dL — ABNORMAL HIGH (ref 8–23)
BUN: 65 mg/dL — ABNORMAL HIGH (ref 8–23)
CO2: 28 mmol/L (ref 22–32)
CO2: 25 mmol/L (ref 22–32)
Calcium: 8.2 mg/dL — ABNORMAL LOW (ref 8.9–10.3)
Calcium: 8.2 mg/dL — ABNORMAL LOW (ref 8.9–10.3)
Chloride: 100 mmol/L (ref 98–111)
Chloride: 98 mmol/L (ref 98–111)
Creatinine, Ser: 2.87 mg/dL — ABNORMAL HIGH (ref 0.44–1.00)
Creatinine, Ser: 3.81 mg/dL — ABNORMAL HIGH (ref 0.44–1.00)
GFR calc Af Amer: 19 mL/min — ABNORMAL LOW (ref >60)
GFR calc Af Amer: 14 mL/min — ABNORMAL LOW (ref >60)
GFR calc non Af Amer: 17 mL/min — ABNORMAL LOW (ref >60)
GFR calc non Af Amer: 12 mL/min — ABNORMAL LOW (ref >60)
Glucose, Bld: 181 mg/dL — ABNORMAL HIGH (ref 70–99)
Glucose, Bld: 168 mg/dL — ABNORMAL HIGH (ref 70–99)
Phosphorus: 3.3 mg/dL (ref 2.5–4.6)
Phosphorus: 3.1 mg/dL (ref 2.5–4.6)
Potassium: 4.2 mmol/L (ref 3.5–5.1)
Potassium: 4.2 mmol/L (ref 3.5–5.1)
Sodium: 138 mmol/L (ref 135–145)
Sodium: 135 mmol/L (ref 135–145)

## 2019-12-20 LAB — GLUCOSE, CAPILLARY
Glucose-Capillary: 178 mg/dL — ABNORMAL HIGH (ref 70–99)
Glucose-Capillary: 162 mg/dL — ABNORMAL HIGH (ref 70–99)
Glucose-Capillary: 135 mg/dL — ABNORMAL HIGH (ref 70–99)
Glucose-Capillary: 148 mg/dL — ABNORMAL HIGH (ref 70–99)
Glucose-Capillary: 182 mg/dL — ABNORMAL HIGH (ref 70–99)

## 2019-12-20 LAB — CBC WITH DIFFERENTIAL/PLATELET
Abs Immature Granulocytes: 0.6 10*3/uL — ABNORMAL HIGH (ref 0.00–0.07)
Basophils Absolute: 0 10*3/uL (ref 0.0–0.1)
Basophils Relative: 0 %
Eosinophils Absolute: 0.1 10*3/uL (ref 0.0–0.5)
Eosinophils Relative: 1 %
HCT: 19.2 % — ABNORMAL LOW (ref 36.0–46.0)
Hemoglobin: 6.1 g/dL — CL (ref 12.0–15.0)
Immature Granulocytes: 5 %
Lymphocytes Relative: 9 %
Lymphs Abs: 1.2 10*3/uL (ref 0.7–4.0)
MCH: 31.8 pg (ref 26.0–34.0)
MCHC: 31.8 g/dL (ref 30.0–36.0)
MCV: 100 fL (ref 80.0–100.0)
Monocytes Absolute: 1.3 10*3/uL — ABNORMAL HIGH (ref 0.1–1.0)
Monocytes Relative: 10 %
Neutro Abs: 9.7 10*3/uL — ABNORMAL HIGH (ref 1.7–7.7)
Neutrophils Relative %: 75 %
Platelets: 196 10*3/uL (ref 150–400)
RBC: 1.92 MIL/uL — ABNORMAL LOW (ref 3.87–5.11)
RDW: 19.2 % — ABNORMAL HIGH (ref 11.5–15.5)
WBC: 12.9 10*3/uL — ABNORMAL HIGH (ref 4.0–10.5)
nRBC: 6 % — ABNORMAL HIGH (ref 0.0–0.2)

## 2019-12-20 LAB — APTT: aPTT: 40 seconds — ABNORMAL HIGH (ref 24–36)

## 2019-12-20 LAB — PREPARE RBC (CROSSMATCH)

## 2019-12-20 LAB — MAGNESIUM: Magnesium: 2.8 mg/dL — ABNORMAL HIGH (ref 1.7–2.4)

## 2019-12-20 MED ORDER — SODIUM CHLORIDE 0.9% IV SOLUTION: Freq: Once | INTRAVENOUS | Status: AC

## 2019-12-20 NOTE — Progress Notes (Signed)
Patient ID: [name], female   DOB: 1956/10/07, 63 y.o.   MRN: [id number] [facility] Progress Note   Assessment/ Plan:   1.  S/p cardiac arrest with STEMI and cardiogenic shock: Previously on pressors that have been weaned off.  Status post coronary angiography with PCI in the setting of diffuse/calcified coronary disease.  Previously with hypoxic/anoxic encephalopathy. 2. ESRD: Was earlier on CRRT because of hemodynamic instability/pressor dependency and yesterday this modality discontinued after labs/volume status/hemodynamic parameters allowed this.  Will order for hemodialysis again tomorrow (she is usually on a Monday/Wednesday/Friday schedule). 3. Anemia: Status post PRBC transfusion this morning without overt loss, CBC in 4 hours. 4. CKD-MBD: With low phosphorus level secondary to CRRT losses, status post intravenous replacement, will continue to follow. 5. Nutrition: Ongoing continuous tube feeds at 45 cc/hour-we will continue to follow albumin/BUN.  Subjective:   Hemoglobin earlier today was low at 6.1 prompting 1 unit PRBC transfusion earlier.   Objective:   BP (!) 109/53   Pulse 90   Temp 100 F (37.8 C) (Oral)   Resp (!) 22   Ht 5' 5"  (1.651 m)   Wt 101.2 kg   SpO2 100%   BMI 37.13 kg/m   Physical Exam: Gen: Intubated, resting comfortably (patient deaf/mute). CVS: Pulse regular rhythm, normal rate, S1 and S2 normal Resp: Anteriorly clear to auscultation, no rales, left IJ dialysis catheter in situ. Abd: Soft, flat, nontender Ext: Bilateral lower extremities in protective booties, left upper arm AVF with palpable thrill  Labs: BMET Recent Labs  Lab 12/17/19 0333 12/17/19 1659 12/18/19 0201 12/18/19 0533 12/18/19 1603 12/19/19 0505 12/19/19 1543 12/20/19 0400  NA 134* 137 137 136 137 138 139 138  K 4.6 4.9 4.6 4.6 4.4 4.1 4.1 4.2  CL 99 102 103  --  100 99 102 100  CO2 27 24 27   --  26 27 28 28   GLUCOSE 145* 187* 135*  --  183* 162* 151*  181*  BUN 17 24* 24*  --  26* 28* 26* 47*  CREATININE 1.92* 2.16* 2.13*  --  1.99* 1.88* 1.72* 2.87*  CALCIUM 8.0* 8.3* 8.3*  --  8.4* 8.8* 8.6* 8.2*  PHOS 1.9* 2.2* 1.9*  1.9*  --  2.6 1.8* 3.0 3.3   CBC Recent Labs  Lab 12/16/19 0347 12/17/19 0333 12/18/19 0201 12/18/19 0533 12/18/19 1603 12/19/19 0505 12/20/19 0400  WBC 7.1 8.5 11.8*  --  13.2* 13.0* 12.9*  NEUTROABS 4.6 5.8 7.9*  --   --   --  9.7*  HGB 7.1* 7.0* 6.1* 6.8* 7.4* 7.0* 6.1*  HCT 23.6* 22.2* 19.2* 20.0* 23.0* 22.0* 19.2*  MCV 103.1* 102.3* 100.5*  --  98.3 98.7 100.0  PLT 166 171 181  --  198 191 196      Medications:    . sodium chloride   Intravenous Once  . aspirin  81 mg Per NG tube Daily  . atorvastatin  10 mg Per Tube QPM  . B-complex with vitamin C  1 tablet Oral Daily  . chlorhexidine gluconate (MEDLINE KIT)  15 mL Mouth Rinse BID  . Chlorhexidine Gluconate Cloth  6 each Topical Q0600  . clopidogrel  75 mg Per Tube QPM  . feeding supplement (PRO-STAT SUGAR FREE 64)  30 mL Per Tube BID  . feeding supplement (VITAL HIGH PROTEIN)  1,000 mL Per Tube Q24H  . insulin aspart  3-9 Units Subcutaneous Q4H  . liver oil-zinc oxide   Topical BID  .  mouth rinse  15 mL Mouth Rinse 10 times per day  . pantoprazole sodium  40 mg Per Tube QHS  . scopolamine  1 patch Transdermal Q72H  . sodium chloride flush  10-40 mL Intracatheter Q12H  . sodium chloride flush  3 mL Intravenous Q12H   Elmarie Shiley, MD 12/20/2019, 7:55 AM

## 2019-12-20 NOTE — Progress Notes (Signed)
CRITICAL VALUE ALERT  Critical Value:  Hgb 6.1  Date & Time Notied:  12/26 0418  Provider Notified: Warren Lacy  Orders Received/Actions taken: 1 Unit PRBC

## 2019-12-20 NOTE — Progress Notes (Signed)
RN called d/t pt w/ high RR and WOB.  Changed back to rest mode on vent. VSS, sat 100%

## 2019-12-20 NOTE — Progress Notes (Signed)
[facility] Physician-Brief Progress Note Patient Name: [name] DOB: 1956/07/16 MRN: [id number]   Date of Service  12/20/2019  HPI/Events of Note  Hemoglobin 6.1 gm  eICU Interventions  Transfuse 1 unit PRBC        Equilla Que U Joandy Burget 12/20/2019, 4:42 AM

## 2019-12-20 NOTE — Progress Notes (Signed)
NAME:  [name], MRN:  [id number], DOB:  02-08-1956, LOS: 9 ADMISSION DATE:  12/21/2019, CONSULTATION DATE: 12/21/2019 REFERRING MD: Dr. Saunders Revel, Cardiology, CHIEF COMPLAINT: Cardiac arrest, acute respiratory failure  Brief History   81, DM, CAD with CM, COVID-19 pneumonia early November.  Admitted 12/17 from SNF after acute arrest, presumed ventricular arrhythmia.  CPR 15 minutes. Partially successful PTCI LAD 12/17, no stent.  To ICU for TTM 33.   Past Medical History   has a past medical history of Anemia, Anemia in CKD (chronic kidney disease) (12/14/2014), Benign hypertension with ESRD (end-stage renal disease) ([place]) (02/12/2018), Bilateral leg edema, Blind, Cardiac arrest ([place]) (12/12/2014), CHF (congestive heart failure) ([place]), Coronary artery disease, Deaf, Diabetes mellitus, ESRD on hemodialysis ([place]), Essential hypertension (08/09/2009), Fibroid uterus, Heart murmur, Hyperlipidemia, Hypertension, Leiomyoma of uterus (08/09/2009), MI (myocardial infarction) ([place]) (11/2014), Moderate tricuspid regurgitation, Osteomyelitis ([place]), Psychiatric disorder, and Pulmonary hypertension ([place]).   Significant Hospital Events   Cardiac arrest 12/17 Cardiac catheterization 12/17 12/19 -  eeg stopped. On CRRt. On fent gtt. On levophed gtt. Off Nimbex. Rewarmed early hours today. CRRT started 12/21> remains on pressors. Remains on CRRT (net even due to shock). Remains intubated and sedated.  12/16/2019 palliative care is following will sign Consults:  Cardiology signed off 12/17/2019 Nephrology PCCM Palliative Procedures:  Left heart catheterization 12/17 >>   Significant Diagnostic Tests:   EEG 12/17 > profound severe encephalopathy, non specific to etiology. No seizures or epileptiform discharges   TTE 12/17 >> LVEF < 20%. LV dilated with akinesis of mid and apical segments. No LV thrombus. Grade II LV diastolic dysfunction.   Micro Data:    Antimicrobials:    Interim  history/subjective:  Remains intubated plan for son to come in today 12/19/2021 attempt to communicate with mother.  Objective   Blood pressure 114/70, pulse 92, temperature 99.7 F (37.6 C), temperature source Axillary, resp. rate (!) 29, height 5\' 5"  (1.651 m), weight 101.2 kg, SpO2 100 %.    Vent Mode: PSV;CPAP FiO2 (%):  [30 %] 30 % Set Rate:  [12 bmp] 12 bmp Vt Set:  [450 mL] 450 mL PEEP:  [5 cmH20] 5 cmH20 Pressure Support:  [12 cmH20-18 cmH20] 12 cmH20 Plateau Pressure:  [21 cmH20-28 cmH20] 21 cmH20   Intake/Output Summary (Last 24 hours) at 12/20/2019 0837 Last data filed at 12/20/2019 0800 Gross per 24 hour  Intake 1258.53 ml  Output 1131 ml  Net 127.53 ml   Filed Weights   12/18/19 0500 12/19/19 0500 12/20/19 0304  Weight: 96 kg 99.8 kg 101.2 kg    General: Elderly obese female on full mechanical ventilatory support HEENT: Endotracheal tube gastric tube in place Neuro: Moves spontaneously of all extremities CV: Heart sounds are regular PULM: Diminished in the bases Vent currently on pressure support of 12 generating a tidal volume of 320 cc FIO2 30% PEEP 5 RATE spontaneous to 16 VT spontaneous tidal volume of approximately 322 480 cc  GI: soft, bsx4 active  GU: Does not make urine Extremities: warm/dry, negative edema, bilateral lower feet have been partially amputated Skin: no rashes or lesions      Resolved Hospital Problem list     Assessment & Plan:   Acute encephalopathy -toxic metabolic. At risk for anoxic injury in setting of cardiac arrest  12/13/2019 - did follow command x1. Unable to assess 12/20 due to sedation needs -Diffuse slowing on EEG suggestive of severe encephalopathy.  This was done on 12/13/2019 She is noted to be  blind, deaf and mute P Wean sedation as tolerated Continue attempts at palliation Son to come in 12/19/2021 attempt to communicate with mother  Acute respiratory failure with hypoxemia due to cardiac  arrest -failed SBT 12/21 P Intermittently tolerating pressure support ventilation Underlying conditions of being deaf mute and blind make any type of meaningful extubation very difficult. 12/20/2019 her son is with him in attempt to communicate with her.  S/p Cardiac Arrest -presumed VT/VF given her history of CAD and prior ventricular arrhythmias, shockable initial rhythm with AED. - s/p 33 TTM with rewarming P Continue supportive care  Anemia Recent Labs    12/19/19 0505 12/20/19 0400  HGB 7.0* 6.1*   P Transfuse per protocol required unit on 12/20/2019  Shock -likely cardiogenic in setting of CAD, STEMI, Cardiac arrest -possible component of sedation related hypotension  P Currently resolved off all pressors   CAD Acute STEMI -Post cardiac catheterization with attempted PTCI to LAD, only partially successful, no stent placed  Severe ICM -LVEF < 20% P Cardiology has signed off since there is no further interventions they can do to correct underlying issues Cardiology reports prognosis is extremely poor 12/20/2019 the son is to come in today to meet with the critical care team    Hx HTN Continuing to hold antihypertensives End-stage renal disease, chronic dialysis Plan CRRT has been transitioned to intermittent hemodialysis per nephrology  Diabetes mellitus CBG (last 3)  Recent Labs    12/19/19 2319 12/20/19 0350 12/20/19 0759  GLUCAP 158* 178* 162*    Sliding scale insulin coverage is in place with adequate coverage  Chronic sacral pressure injury -present prior to admission P Continue wound care as directed by the wound ostomy team  Best practice:  Diet: TF Pain/Anxiety/Delirium protocol (if indicated): Versed, fentanyl VAP protocol (if indicated): Yes DVT prophylaxis: heparin via crrt circuit  GI prophylaxis: PPI Glucose control: SSI  Mobility: BR Code Status: Full Family Communication: Spoke with son Cena Bruhn 12/18/2019.  He is still  engaging other family members in conversation about goals of care for his mother.  He was updated at length and expressed appreciation for the update.  For now we will continue aggressive care and we await further input from him and family members.  12/20/2019 spoke to son Mairely Foxworth he says he will come in today and speak with physicians and attempt to communicate with his mother. Disposition: ICU   LABS    PULMONARY Recent Labs  Lab 12/13/19 1001 12/13/19 1153 12/17/19 1222 12/18/19 0533  PHART  --   --   --  7.457*  PCO2ART  --   --   --  39.2  PO2ART  --   --   --  87.0  HCO3  --   --  29.0* 27.7  TCO2 27 26 30 29   O2SAT  --   --  78.0 97.0    CBC Recent Labs  Lab 12/18/19 1603 12/19/19 0505 12/20/19 0400  HGB 7.4* 7.0* 6.1*  HCT 23.0* 22.0* 19.2*  WBC 13.2* 13.0* 12.9*  PLT 198 191 196    COAGULATION No results for input(s): INR in the last 168 hours.  CARDIAC  No results for input(s): TROPONINI in the last 168 hours. No results for input(s): PROBNP in the last 168 hours.   CHEMISTRY Recent Labs  Lab 12/16/19 0347 12/17/19 0333 12/17/19 1222 12/18/19 0201 12/18/19 0533 12/18/19 1603 12/19/19 0505 12/19/19 1543 12/20/19 0400  NA 138 134*  --  137 136 137  138 139 138  K 4.9 4.6  --  4.6 4.6 4.4 4.1 4.1 4.2  CL 101 99   < > 103  --  100 99 102 100  CO2 27 27   < > 27  --  26 27 28 28   GLUCOSE 106* 145*   < > 135*  --  183* 162* 151* 181*  BUN 15 17   < > 24*  --  26* 28* 26* 47*  CREATININE 2.01* 1.92*   < > 2.13*  --  1.99* 1.88* 1.72* 2.87*  CALCIUM 8.1* 8.0*   < > 8.3*  --  8.4* 8.8* 8.6* 8.2*  MG 2.5* 2.7*  --  2.6*  --   --  2.8*  --  2.8*  PHOS 2.1* 1.9*   < > 1.9*  1.9*  --  2.6 1.8* 3.0 3.3   < > = values in this interval not displayed.   Estimated Creatinine Clearance: 23.7 mL/min (A) (by C-G formula based on SCr of 2.87 mg/dL (H)).   LIVER Recent Labs  Lab 12/18/19 0201 12/18/19 1603 12/19/19 0505 12/19/19 1543 12/20/19 0400    ALBUMIN 2.5* 2.8* 2.7* 2.7* 2.5*     INFECTIOUS No results for input(s): LATICACIDVEN, PROCALCITON in the last 168 hours.   ENDOCRINE CBG (last 3)  Recent Labs    12/19/19 2319 12/20/19 0350 12/20/19 0759  GLUCAP 158* 178* 162*    IMAGING x48h  - image(s) personally visualized  -   highlighted in bold MR BRAIN WO CONTRAST  Result Date: 12/20/2019 CLINICAL DATA:  Encephalopathy. History of COVID-19. EXAM: MRI HEAD WITHOUT CONTRAST TECHNIQUE: Multiplanar, multiecho pulse sequences of the brain and surrounding structures were obtained without intravenous contrast. COMPARISON:  None. FINDINGS: BRAIN: There is no acute infarct, acute hemorrhage or extra-axial collection. Early confluent hyperintense T2-weighted signal of the periventricular and deep white matter, most commonly due to chronic ischemic microangiopathy. There is generalized atrophy without lobar predilection. The midline structures are normal. VASCULAR: The major intracranial arterial and venous sinus flow voids are normal. Two foci of chronic microhemorrhage in the left dorsomedial thalamus. SKULL AND UPPER CERVICAL SPINE: Calvarial bone marrow signal is normal. There is no skull base mass. The visualized upper cervical spine and soft tissues are normal. SINUSES/ORBITS: There are no fluid levels or advanced mucosal thickening. The mastoid air cells and middle ear cavities are free of fluid. Low T1/T2-weighted signal material within both globes, likely aging blood products. IMPRESSION: 1. No acute intracranial abnormality. 2. Generalized atrophy and moderate chronic small vessel disease. Electronically Signed   By: Ulyses Jarred M.D.   On: 12/20/2019 01:54    App cct 30 min  Richardson Landry Chadd Tollison ACNP Acute Care Nurse Practitioner [place] Please consult Amion 12/20/2019, 8:37 AM

## 2019-12-21 ENCOUNTER — Inpatient Hospital Stay (HOSPITAL_COMMUNITY): Payer: Medicare Other

## 2019-12-21 LAB — HEMOGLOBIN AND HEMATOCRIT, BLOOD
HCT: 26.5 % — ABNORMAL LOW (ref 36.0–46.0)
Hemoglobin: 8.5 g/dL — ABNORMAL LOW (ref 12.0–15.0)

## 2019-12-21 LAB — RENAL FUNCTION PANEL
Albumin: 2.6 g/dL — ABNORMAL LOW (ref 3.5–5.0)
Albumin: 2.5 g/dL — ABNORMAL LOW (ref 3.5–5.0)
Anion gap: 10 (ref 5–15)
Anion gap: 17 — ABNORMAL HIGH (ref 5–15)
BUN: 77 mg/dL — ABNORMAL HIGH (ref 8–23)
BUN: 97 mg/dL — ABNORMAL HIGH (ref 8–23)
CO2: 26 mmol/L (ref 22–32)
CO2: 21 mmol/L — ABNORMAL LOW (ref 22–32)
Calcium: 8.3 mg/dL — ABNORMAL LOW (ref 8.9–10.3)
Calcium: 8.6 mg/dL — ABNORMAL LOW (ref 8.9–10.3)
Chloride: 99 mmol/L (ref 98–111)
Chloride: 97 mmol/L — ABNORMAL LOW (ref 98–111)
Creatinine, Ser: 4.72 mg/dL — ABNORMAL HIGH (ref 0.44–1.00)
Creatinine, Ser: 5.76 mg/dL — ABNORMAL HIGH (ref 0.44–1.00)
GFR calc Af Amer: 11 mL/min — ABNORMAL LOW (ref >60)
GFR calc Af Amer: 8 mL/min — ABNORMAL LOW (ref >60)
GFR calc non Af Amer: 9 mL/min — ABNORMAL LOW (ref >60)
GFR calc non Af Amer: 7 mL/min — ABNORMAL LOW (ref >60)
Glucose, Bld: 163 mg/dL — ABNORMAL HIGH (ref 70–99)
Glucose, Bld: 169 mg/dL — ABNORMAL HIGH (ref 70–99)
Phosphorus: 3.7 mg/dL (ref 2.5–4.6)
Phosphorus: 5.1 mg/dL — ABNORMAL HIGH (ref 2.5–4.6)
Potassium: 4.3 mmol/L (ref 3.5–5.1)
Potassium: 6.2 mmol/L — ABNORMAL HIGH (ref 3.5–5.1)
Sodium: 135 mmol/L (ref 135–145)
Sodium: 135 mmol/L (ref 135–145)

## 2019-12-21 LAB — GLUCOSE, CAPILLARY
Glucose-Capillary: 133 mg/dL — ABNORMAL HIGH (ref 70–99)
Glucose-Capillary: 140 mg/dL — ABNORMAL HIGH (ref 70–99)
Glucose-Capillary: 145 mg/dL — ABNORMAL HIGH (ref 70–99)
Glucose-Capillary: 146 mg/dL — ABNORMAL HIGH (ref 70–99)
Glucose-Capillary: 149 mg/dL — ABNORMAL HIGH (ref 70–99)
Glucose-Capillary: 154 mg/dL — ABNORMAL HIGH (ref 70–99)
Glucose-Capillary: 209 mg/dL — ABNORMAL HIGH (ref 70–99)

## 2019-12-21 LAB — APTT: aPTT: 36 seconds (ref 24–36)

## 2019-12-21 LAB — MAGNESIUM: Magnesium: 2.9 mg/dL — ABNORMAL HIGH (ref 1.7–2.4)

## 2019-12-21 MED ORDER — RACEPINEPHRINE HCL 2.25 % IN NEBU
INHALATION_SOLUTION | RESPIRATORY_TRACT | Status: AC
  Administered 2019-12-21: 0.5 mL via RESPIRATORY_TRACT
  Filled 2019-12-21: qty 0.5

## 2019-12-21 MED ORDER — FENTANYL 2500MCG IN NS 250ML (10MCG/ML) PREMIX INFUSION
0.0000 ug/h | INTRAVENOUS | Status: DC
  Administered 2019-12-21: 85 ug/h via INTRAVENOUS
  Administered 2019-12-21: 75 ug/h via INTRAVENOUS
  Administered 2019-12-22: 19:00:00 175 ug/h via INTRAVENOUS
  Filled 2019-12-21 (×2): qty 250

## 2019-12-21 MED ORDER — DARBEPOETIN ALFA 100 MCG/0.5ML IJ SOSY
100.0000 ug | PREFILLED_SYRINGE | Freq: Once | INTRAMUSCULAR | Status: AC
  Administered 2019-12-21: 100 ug via INTRAVENOUS
  Filled 2019-12-21: qty 0.5

## 2019-12-21 MED ORDER — MIDAZOLAM HCL 2 MG/2ML IJ SOLN
INTRAMUSCULAR | Status: AC
  Administered 2019-12-21: 2 mg
  Filled 2019-12-21: qty 2

## 2019-12-21 MED ORDER — RACEPINEPHRINE HCL 2.25 % IN NEBU: 0.5000 mL | INHALATION_SOLUTION | Freq: Once | RESPIRATORY_TRACT | Status: AC

## 2019-12-21 MED ORDER — FENTANYL CITRATE (PF) 100 MCG/2ML IJ SOLN
INTRAMUSCULAR | Status: AC
  Administered 2019-12-21: 100 ug
  Filled 2019-12-21: qty 2

## 2019-12-21 MED ORDER — MIDAZOLAM HCL 2 MG/2ML IJ SOLN
1.0000 mg | Freq: Four times a day (QID) | INTRAMUSCULAR | Status: DC | PRN
  Administered 2019-12-24: 1 mg via INTRAVENOUS
  Filled 2019-12-21: qty 2

## 2019-12-21 MED ORDER — DEXAMETHASONE SODIUM PHOSPHATE 10 MG/ML IJ SOLN
10.0000 mg | Freq: Four times a day (QID) | INTRAMUSCULAR | Status: AC
  Administered 2019-12-21 – 2019-12-22 (×4): 10 mg via INTRAVENOUS
  Filled 2019-12-21 (×4): qty 1

## 2019-12-21 MED ORDER — DEXMEDETOMIDINE HCL IN NACL 400 MCG/100ML IV SOLN
0.4000 ug/kg/h | INTRAVENOUS | Status: DC
  Administered 2019-12-21: 1 ug/kg/h via INTRAVENOUS
  Administered 2019-12-21: 0.4 ug/kg/h via INTRAVENOUS
  Filled 2019-12-21 (×2): qty 100

## 2019-12-21 MED ORDER — ROCURONIUM BROMIDE 50 MG/5ML IV SOLN
50.0000 mg | Freq: Once | INTRAVENOUS | Status: AC
  Administered 2019-12-21: 50 mg via INTRAVENOUS

## 2019-12-21 MED ORDER — ETOMIDATE 2 MG/ML IV SOLN
20.0000 mg | Freq: Once | INTRAVENOUS | Status: AC
  Administered 2019-12-21: 20 mg via INTRAVENOUS

## 2019-12-21 NOTE — Procedures (Signed)
Intubation Procedure Note  [name]  [id number] [birth year]   Procedure: Intubation Indications: Respiratory insufficiency  Procedure Details Consent: Risks of procedure as well as the alternatives and risks of each were explained to the (patient/caregiver).  Consent for procedure obtained. Time Out: Verified patient identification, verified procedure, site/side was marked, verified correct patient position, special equipment/implants available, medications/allergies/relevent history reviewed, required imaging and test results available.  Performed  Pre-oxygenation: 100% via BiPAP Premedication: Midazolam 2 mg, fentanyl 100 mcg Position: supine Induction agent: Etomidate 10 mg Paralytic: Rocuronium 50 mg Technique: Glide scope video laryngoscopy MAC 3 blade Tube size: 7 mm Laryngoscopy view: Grade 1 Number of attempts: 1 Insertion depth: 22 cm at the lip Tube secured: tube holder Other findings: Minimal secretions.  False cords and epiglottis appeared swollen.   OGT/NGT inserted: Yes Position confirmed by auscultation: Yes  Evaluation Colorimetric change: Positive Bilateral breath sounds: Present Hemodynamic Status: BP stable throughout; O2 sats: stable throughout Patient's Current Condition: stable Complications: No apparent complications Patient did tolerate procedure well. Chest X-ray ordered to verify placement.  CXR: pending.   [name] [name] 11/06/2018

## 2019-12-21 NOTE — Progress Notes (Addendum)
Patient's heart rate dropped to mid 40's approximately 1500. Began titrating precedex down then turned it off at 1520. Placed call to CCM on call(Dr. Argawala) and received orders to monitor since precedex requires approximately an hour to clear from system. Received verbal order to use ativan 1 mg instead of precedex for future sedation needs.

## 2019-12-21 NOTE — Progress Notes (Signed)
CCM ordered reintubation at 10:55. 25ml versed, 167mcg fentanyl, 10 ml etomidate and 50 mg rocuronium given by RN

## 2019-12-21 NOTE — Progress Notes (Signed)
Received call from patient's daughter requesting an update from CCM. Alerted Dr. Lynetta Mare and informed him that patient's son is POA.

## 2019-12-21 NOTE — Progress Notes (Addendum)
Patient ID: [name], female   DOB: 09/05/1956, 63 y.o.   MRN: [id number] With hemodynamic instability noted this afternoon. Based on her labs and earlier assessment today, I will postpone her dialysis treatment until tomorrow (no indications for CRRT at this time).  Elmarie Shiley MD [facility]. Office # [phone] Pager # [phone] 3:58 PM

## 2019-12-21 NOTE — Progress Notes (Addendum)
Patient ID: [name], female   DOB: [birth year], 63 y.o.   MRN: [id number]  [facility] Progress Note   Assessment/ Plan:   1.  S/p cardiac arrest with STEMI and cardiogenic shock: Previously on pressors that have been weaned off.  Status post coronary angiography with PCI in the setting of diffuse/calcified coronary disease.  With likely hypoxic/anoxic encephalopathy-awaiting input from son to assess for extent of injury.  Weaning per CCM. 2. ESRD: She is usually on a Monday/Wednesday/Friday dialysis schedule and was earlier on CRRT until 12/25 evening.  Will undertake dialysis today for clearance/ultrafiltration to improve chances at weaning and reassess labs tomorrow to decide on dialysis tomorrow or Tuesday. 3. Anemia: Status post PRBC transfusion yesterday with improvement of hemoglobin/hematocrit and no overt loss.  We will continue to follow on darbepoetin. 4. CKD-MBD: Phosphorus levels improved with replacement, calcium level acceptable.  Continue to follow off binders. 5. Nutrition: Ongoing continuous tube feeds at 45 cc/hour along with prostat 30 cc twice daily.  On MVI.  Subjective:   No acute events noted overnight, appears to be weaning well per CCM note.   Objective:   BP (!) 122/55   Pulse 82   Temp 99.9 F (37.7 C) (Axillary)   Resp 18   Ht 5' 5"  (1.651 m)   Wt 102.1 kg   SpO2 100%   BMI 37.46 kg/m   Physical Exam: Gen: Resting comfortably on ventilator support, awakens to touch. CVS: Pulse regular rhythm, normal rate, S1 and S2 normal Resp: Anteriorly clear to auscultation, no rales, left IJ dialysis catheter in situ. Abd: Soft, flat, nontender Ext: Bilateral lower extremities status post TMA in protective booties, left upper arm AVF with palpable thrill  Labs: BMET Recent Labs  Lab 12/18/19 0201 12/18/19 0533 12/18/19 1603 12/19/19 0505 12/19/19 1543 12/20/19 0400 12/20/19 1527 12/21/19 0405  NA 137 136 137 138 139 138 135 135  K 4.6 4.6  4.4 4.1 4.1 4.2 4.2 4.3  CL 103  --  100 99 102 100 98 99  CO2 27  --  26 27 28 28 25 26   GLUCOSE 135*  --  183* 162* 151* 181* 168* 163*  BUN 24*  --  26* 28* 26* 47* 65* 77*  CREATININE 2.13*  --  1.99* 1.88* 1.72* 2.87* 3.81* 4.72*  CALCIUM 8.3*  --  8.4* 8.8* 8.6* 8.2* 8.2* 8.3*  PHOS 1.9*  1.9*  --  2.6 1.8* 3.0 3.3 3.1 3.7   CBC Recent Labs  Lab 12/16/19 0347 12/17/19 0333 12/18/19 0201 12/18/19 1603 12/19/19 0505 12/20/19 0400 12/20/19 1132 12/21/19 0405  WBC 7.1 8.5 11.8* 13.2* 13.0* 12.9* 13.7*  --   NEUTROABS 4.6 5.8 7.9*  --   --  9.7*  --   --   HGB 7.1* 7.0* 6.1* 7.4* 7.0* 6.1* 6.7* 8.5*  HCT 23.6* 22.2* 19.2* 23.0* 22.0* 19.2* 21.2* 26.5*  MCV 103.1* 102.3* 100.5* 98.3 98.7 100.0 97.7  --   PLT 166 171 181 198 191 196 193  --       Medications:    . sodium chloride   Intravenous Once  . aspirin  81 mg Per NG tube Daily  . atorvastatin  10 mg Per Tube QPM  . B-complex with vitamin C  1 tablet Oral Daily  . chlorhexidine gluconate (MEDLINE KIT)  15 mL Mouth Rinse BID  . Chlorhexidine Gluconate Cloth  6 each Topical Q0600  . clopidogrel  75 mg Per Tube QPM  .  feeding supplement (PRO-STAT SUGAR FREE 64)  30 mL Per Tube BID  . feeding supplement (VITAL HIGH PROTEIN)  1,000 mL Per Tube Q24H  . insulin aspart  3-9 Units Subcutaneous Q4H  . liver oil-zinc oxide   Topical BID  . mouth rinse  15 mL Mouth Rinse 10 times per day  . pantoprazole sodium  40 mg Per Tube QHS  . scopolamine  1 patch Transdermal Q72H  . sodium chloride flush  10-40 mL Intracatheter Q12H  . sodium chloride flush  3 mL Intravenous Q12H   Elmarie Shiley, MD 12/21/2019, 7:41 AM

## 2019-12-21 NOTE — Procedures (Signed)
Extubation Procedure Note  Patient Details:   Name: ZAKYA HALABI DOB: May 02, 1956 MRN: [id number]   Airway Documentation:  +  cuff leak test prior to extubation   Vent end date: 12/21/19 Vent end time: 0950   Evaluation  O2 sats: stable throughout Complications: Complications of see note below Patient see note below [facility] w/ coarse crackles, rhonchi and upper airway congestion.    No, pt is deaf and not able to speak.    Once pt is extubated, noted crackles t/o, rhonchi and upper airway/throat congestion.  Dr Lynetta Mare was notified immediately, per MD he ordered NTS and a racemic epi treatment.  After this was completed, noted no improvement w/ WOB and RR.  MD came to bedside and bipap was initiated.      Lenna Sciara 12/21/2019, 10:26 AM

## 2019-12-21 NOTE — Progress Notes (Signed)
NAME:  [name], MRN:  [id number], DOB:  14-Jun-1956, LOS: 67 ADMISSION DATE:  12/05/2019, CONSULTATION DATE: 12/10/2019 REFERRING MD: Dr. Saunders Revel, Cardiology, CHIEF COMPLAINT: Cardiac arrest, acute respiratory failure  Brief History   49, DM, CAD with CM, COVID-19 pneumonia early November.  Admitted 12/17 from SNF after acute arrest, presumed ventricular arrhythmia.  CPR 15 minutes. Partially successful PTCI LAD 12/17, no stent.  To ICU for TTM 33.   Past Medical History   has a past medical history of Anemia, Anemia in CKD (chronic kidney disease) (12/14/2014), Benign hypertension with ESRD (end-stage renal disease) ([place]) (02/12/2018), Bilateral leg edema, Blind, Cardiac arrest ([place]) (12/12/2014), CHF (congestive heart failure) ([place]), Coronary artery disease, Deaf, Diabetes mellitus, ESRD on hemodialysis ([place]), Essential hypertension (08/09/2009), Fibroid uterus, Heart murmur, Hyperlipidemia, Hypertension, Leiomyoma of uterus (08/09/2009), MI (myocardial infarction) ([place]) (11/2014), Moderate tricuspid regurgitation, Osteomyelitis ([place]), Psychiatric disorder, and Pulmonary hypertension ([place]).   Significant Hospital Events   Cardiac arrest 12/17 Cardiac catheterization 12/17 12/19 -  eeg stopped. On CRRt. On fent gtt. On levophed gtt. Off Nimbex. Rewarmed early hours today. CRRT started 12/21> remains on pressors. Remains on CRRT (net even due to shock). Remains intubated and sedated.  12/16/2019 palliative care is following  12/21/2019 we will attempt extubation Consults:  Cardiology signed off 12/17/2019 Nephrology PCCM Palliative Procedures:  Left heart catheterization 12/17 >> severe two-vessel coronary disease with chronic total occlusion of the ostial RCA and subacute or chronic totally occluded mid LAD.  Distal LAD is small and diffusely diseased.  Moderately reduced left ventricular systolic function with mild apical akinesis.  PTCA of mid LAD and D2 with reestablishment of TIMI flow  and 7% residual stenosis.  Given small size of vessel heavy calcification stent placement was not attempted.  Significant Diagnostic Tests:   EEG 12/17 > profound severe encephalopathy, non specific to etiology. No seizures or epileptiform discharges   TTE 12/17 >> LVEF < 20%. LV dilated with akinesis of mid and apical segments. No LV thrombus. Grade II LV diastolic dysfunction.   Micro Data:    Antimicrobials:    Interim history/subjective:  Able to communicate via tactile stimulation with interpreter.  Plan for attempted extubation.  Objective   Blood pressure (!) 87/65, pulse 85, temperature 99.9 F (37.7 C), temperature source Axillary, resp. rate (!) 28, height 5\' 5"  (1.651 m), weight 102.1 kg, SpO2 98 %.    Vent Mode: PSV;CPAP FiO2 (%):  [30 %] 30 % Set Rate:  [12 bmp] 12 bmp Vt Set:  [450 mL] 450 mL PEEP:  [5 cmH20] 5 cmH20 Pressure Support:  [5 cmH20-10 cmH20] 5 cmH20 Plateau Pressure:  [21 cmH20] 21 cmH20   Intake/Output Summary (Last 24 hours) at 12/21/2019 0856 Last data filed at 12/21/2019 0800 Gross per 24 hour  Intake 886.2 ml  Output --  Net 886.2 ml   Filed Weights   12/19/19 0500 12/20/19 0304 12/21/19 0156  Weight: 99.8 kg 101.2 kg 102.1 kg    General: Morbid obese female is able to side HEENT: Endotracheal tube gastric tube in place Neuro: Able to sign via tactile stimulation. CV: Sounds are regular PULM: Diminished in the bases Vent pressure support 5 FIO2 40% PEEP 5 RATE 16 spontaneous VT or 20  GI: soft, bsx4 active  GU: Does not void Extremities: Bilateral partial amputations of lower extremity Skin: no rashes or lesions       Resolved Hospital Problem list     Assessment & Plan:   Acute encephalopathy -  toxic metabolic. At risk for anoxic injury in setting of cardiac arrest  12/13/2019 - did follow command x1. Unable to assess 12/20 due to sedation needs -Diffuse slowing on EEG suggestive of severe encephalopathy.  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 She is side understanding she knows was going on. Therefore plan is to attempt extubation if she fails to be reintubated and plans for [place]-term care will be instituted Son was updated today  S/p Cardiac Arrest -presumed VT/VF given her history of CAD and prior ventricular arrhythmias, shockable initial rhythm with AED. - s/p 33 TTM with rewarming P Continue supportive care  Anemia Recent Labs    12/20/19 1132 12/21/19 0405  HGB 6.7* 8.5*   P Transfuse per protocol required unit on 12/20/2019  Shock -likely cardiogenic in setting of CAD, STEMI, Cardiac arrest -possible component of sedation related hypotension  P Resolved   CAD Acute STEMI -Post cardiac catheterization with attempted PTCI to LAD, only partially successful, no stent placed  Severe ICM -LVEF < 20% P Cardiology has signed off since there is no further interventions available Poor prognosis    Hx HTN Continuing to hold antihypertensives End-stage renal disease, chronic dialysis Plan Currently on intermittent hemodialysis per nephrology  Diabetes mellitus CBG (last 3)  Recent Labs    12/21/19 0003 12/21/19 0346 12/21/19 0814  GLUCAP 133* 146* 154*    Continue sliding-scale coverage  Chronic sacral pressure injury -present prior to admission P Continue wound care as directed by the wound ostomy team  Best practice:  Diet: TF Pain/Anxiety/Delirium protocol (if indicated): Versed, fentanyl VAP protocol (if indicated): Yes DVT prophylaxis: heparin via crrt circuit  GI prophylaxis: PPI Glucose control: SSI  Mobility: BR Code Status: Full Family Communication:  12/21/2019 spoke with the son [name] he had interpreters come up to the unit today 12/21/2019 to interpret for the patient.  Were able to communicate with the patient and plan for extubation and reintubation if needed.  Updated the son on 1227 as to the plan. Disposition: ICU   LABS    PULMONARY Recent Labs  Lab 12/17/19 1222 12/18/19 0533  PHART  --  7.457*  PCO2ART  --  39.2  PO2ART  --  87.0  HCO3 29.0* 27.7  TCO2 30 29  O2SAT 78.0 97.0    CBC Recent Labs  Lab 12/19/19 0505 12/20/19 0400 12/20/19 1132 12/21/19 0405  HGB 7.0* 6.1* 6.7* 8.5*  HCT 22.0* 19.2* 21.2* 26.5*  WBC 13.0* 12.9* 13.7*  --   PLT 191 196 193  --     COAGULATION No results for input(s): INR in the last 168 hours.  CARDIAC  No results for input(s): TROPONINI in the last 168 hours. No results for input(s): PROBNP in the last 168 hours.   CHEMISTRY Recent Labs  Lab 12/17/19 0333 12/17/19 1222 12/18/19 0201 12/18/19 0533 12/19/19 0505 12/19/19 1543 12/20/19 0400 12/20/19 1527 12/21/19 0405  NA 134*  --  137  --  138 139 138 135 135  K 4.6  --  4.6  --  4.1 4.1 4.2 4.2 4.3  CL 99   < > 103   < > 99 102 100 98 99  CO2 27   < >  27   < > 27 28 28 25 26   GLUCOSE 145*   < > 135*   < > 162* 151* 181* 168* 163*  BUN 17   < > 24*   < > 28* 26* 47* 65* 77*  CREATININE 1.92*   < > 2.13*   < > 1.88* 1.72* 2.87* 3.81* 4.72*  CALCIUM 8.0*   < > 8.3*   < > 8.8* 8.6* 8.2* 8.2* 8.3*  MG 2.7*  --  2.6*  --  2.8*  --  2.8*  --  2.9*  PHOS 1.9*   < > 1.9*  1.9*   < > 1.8* 3.0 3.3 3.1 3.7   < > = values in this interval not displayed.   Estimated Creatinine Clearance: 14.4 mL/min (A) (by C-G formula based on SCr of 4.72 mg/dL (H)).   LIVER Recent Labs  Lab 12/19/19 0505 12/19/19 1543 12/20/19 0400 12/20/19 1527 12/21/19 0405  ALBUMIN 2.7* 2.7* 2.5* 2.5* 2.6*     INFECTIOUS No results for input(s): LATICACIDVEN, PROCALCITON in the last 168 hours.   ENDOCRINE CBG (last 3)  Recent  Labs    12/21/19 0003 12/21/19 0346 12/21/19 0814  GLUCAP 133* 146* 154*    IMAGING x48h  - image(s) personally visualized  -   highlighted in bold MR BRAIN WO CONTRAST  Result Date: 12/20/2019 CLINICAL DATA:  Encephalopathy. History of COVID-19. EXAM: MRI HEAD WITHOUT CONTRAST TECHNIQUE: Multiplanar, multiecho pulse sequences of the brain and surrounding structures were obtained without intravenous contrast. COMPARISON:  None. FINDINGS: BRAIN: There is no acute infarct, acute hemorrhage or extra-axial collection. Early confluent hyperintense T2-weighted signal of the periventricular and deep white matter, most commonly due to chronic ischemic microangiopathy. There is generalized atrophy without lobar predilection. The midline structures are normal. VASCULAR: The major intracranial arterial and venous sinus flow voids are normal. Two foci of chronic microhemorrhage in the left dorsomedial thalamus. SKULL AND UPPER CERVICAL SPINE: Calvarial bone marrow signal is normal. There is no skull base mass. The visualized upper cervical spine and soft tissues are normal. SINUSES/ORBITS: There are no fluid levels or advanced mucosal thickening. The mastoid air cells and middle ear cavities are free of fluid. Low T1/T2-weighted signal material within both globes, likely aging blood products. IMPRESSION: 1. No acute intracranial abnormality. 2. Generalized atrophy and moderate chronic small vessel disease. Electronically Signed   By: Ulyses Jarred M.D.   On: 12/20/2019 01:54   AM DG Chest Port 1 View  Result Date: 12/21/2019 CLINICAL DATA:  63 year old female positive COVID-19 status post cardiac arrest and STEMI. Hemodialysis. EXAM: PORTABLE CHEST 1 VIEW COMPARISON:  12/18/2019 and earlier. FINDINGS: Portable AP semi upright view at 0625 hours. Endotracheal tube tip at the level the clavicles. Enteric tube side hole at the level of the stomach. Stable left IJ dual lumen dialysis type catheter. Continued low  lung volumes. Stable cardiac size and mediastinal contours. Patchy opacity at the medial right lung base most resembles atelectasis. Elsewhere Allowing for portable technique the lungs are clear. No pneumothorax. Paucity of bowel gas. IMPRESSION: 1.  Stable lines and tubes. 2. Mild lung base atelectasis. No other acute cardiopulmonary abnormality. Electronically Signed   By: Genevie Ann M.D.   On: 12/21/2019 08:17    App cct 30 min  Richardson Landry Shaniah Baltes ACNP Acute Care Nurse Practitioner [place] Please consult Amion 12/21/2019, 8:56 AM

## 2019-12-21 NOTE — Progress Notes (Signed)
Fentanyl stopped at 8:50, patient extubated at 9:50.

## 2019-12-22 ENCOUNTER — Inpatient Hospital Stay (HOSPITAL_COMMUNITY): Payer: Medicare Other

## 2019-12-22 LAB — RENAL FUNCTION PANEL
Albumin: 2.5 g/dL — ABNORMAL LOW (ref 3.5–5.0)
Albumin: 2.4 g/dL — ABNORMAL LOW (ref 3.5–5.0)
Anion gap: 17 — ABNORMAL HIGH (ref 5–15)
Anion gap: 14 (ref 5–15)
BUN: 128 mg/dL — ABNORMAL HIGH (ref 8–23)
BUN: 65 mg/dL — ABNORMAL HIGH (ref 8–23)
CO2: 21 mmol/L — ABNORMAL LOW (ref 22–32)
CO2: 25 mmol/L (ref 22–32)
Calcium: 8 mg/dL — ABNORMAL LOW (ref 8.9–10.3)
Calcium: 7.7 mg/dL — ABNORMAL LOW (ref 8.9–10.3)
Chloride: 97 mmol/L — ABNORMAL LOW (ref 98–111)
Chloride: 93 mmol/L — ABNORMAL LOW (ref 98–111)
Creatinine, Ser: 6.68 mg/dL — ABNORMAL HIGH (ref 0.44–1.00)
Creatinine, Ser: 4.16 mg/dL — ABNORMAL HIGH (ref 0.44–1.00)
GFR calc Af Amer: 7 mL/min — ABNORMAL LOW (ref >60)
GFR calc Af Amer: 12 mL/min — ABNORMAL LOW (ref >60)
GFR calc non Af Amer: 6 mL/min — ABNORMAL LOW (ref >60)
GFR calc non Af Amer: 11 mL/min — ABNORMAL LOW (ref >60)
Glucose, Bld: 301 mg/dL — ABNORMAL HIGH (ref 70–99)
Glucose, Bld: 279 mg/dL — ABNORMAL HIGH (ref 70–99)
Phosphorus: 5.8 mg/dL — ABNORMAL HIGH (ref 2.5–4.6)
Phosphorus: 4.4 mg/dL (ref 2.5–4.6)
Potassium: 5.3 mmol/L — ABNORMAL HIGH (ref 3.5–5.1)
Potassium: 3.9 mmol/L (ref 3.5–5.1)
Sodium: 135 mmol/L (ref 135–145)
Sodium: 132 mmol/L — ABNORMAL LOW (ref 135–145)

## 2019-12-22 LAB — TYPE AND SCREEN
ABO/RH(D): A POS
Antibody Screen: POSITIVE
Donor AG Type: NEGATIVE
Donor AG Type: NEGATIVE
Donor AG Type: NEGATIVE
Donor AG Type: NEGATIVE
Donor AG Type: NEGATIVE
PT AG Type: NEGATIVE
Unit division: 0
Unit division: 0
Unit division: 0
Unit division: 0
Unit division: 0
Unit division: 0

## 2019-12-22 LAB — GLUCOSE, CAPILLARY
Glucose-Capillary: 252 mg/dL — ABNORMAL HIGH (ref 70–99)
Glucose-Capillary: 219 mg/dL — ABNORMAL HIGH (ref 70–99)
Glucose-Capillary: 191 mg/dL — ABNORMAL HIGH (ref 70–99)
Glucose-Capillary: 217 mg/dL — ABNORMAL HIGH (ref 70–99)
Glucose-Capillary: 211 mg/dL — ABNORMAL HIGH (ref 70–99)
Glucose-Capillary: 164 mg/dL — ABNORMAL HIGH (ref 70–99)
Glucose-Capillary: 130 mg/dL — ABNORMAL HIGH (ref 70–99)
Glucose-Capillary: 163 mg/dL — ABNORMAL HIGH (ref 70–99)
Glucose-Capillary: 122 mg/dL — ABNORMAL HIGH (ref 70–99)
Glucose-Capillary: 163 mg/dL — ABNORMAL HIGH (ref 70–99)
Glucose-Capillary: 263 mg/dL — ABNORMAL HIGH (ref 70–99)

## 2019-12-22 LAB — BPAM RBC
Blood Product Expiration Date: 202101232359
Blood Product Expiration Date: 202101232359
Blood Product Expiration Date: 202101242359
Blood Product Expiration Date: 202101242359
Blood Product Expiration Date: 202101262359
Blood Product Expiration Date: 202101302359
ISSUE DATE / TIME: 202012240801
ISSUE DATE / TIME: 202012260511
ISSUE DATE / TIME: 202012261638
ISSUE DATE / TIME: 202012262000
Unit Type and Rh: 6200
Unit Type and Rh: 6200
Unit Type and Rh: 6200
Unit Type and Rh: 6200
Unit Type and Rh: 6200
Unit Type and Rh: 6200

## 2019-12-22 LAB — PATHOLOGIST SMEAR REVIEW

## 2019-12-22 LAB — APTT: aPTT: 44 seconds — ABNORMAL HIGH (ref 24–36)

## 2019-12-22 LAB — POCT ACTIVATED CLOTTING TIME
Activated Clotting Time: 131 seconds
Activated Clotting Time: 131 seconds

## 2019-12-22 LAB — MAGNESIUM: Magnesium: 3 mg/dL — ABNORMAL HIGH (ref 1.7–2.4)

## 2019-12-22 MED ORDER — DEXTROSE 10 % IV SOLN: INTRAVENOUS | Status: DC

## 2019-12-22 MED ORDER — INSULIN ASPART 100 UNIT/ML ~~LOC~~ SOLN
3.0000 [IU] | SUBCUTANEOUS | Status: DC
  Administered 2019-12-22: 17:00:00 6 [IU] via SUBCUTANEOUS
  Administered 2019-12-22: 9 [IU] via SUBCUTANEOUS
  Administered 2019-12-23 (×2): 6 [IU] via SUBCUTANEOUS
  Administered 2019-12-23 (×3): 9 [IU] via SUBCUTANEOUS
  Administered 2019-12-24: 6 [IU] via SUBCUTANEOUS

## 2019-12-22 MED ORDER — PENTAFLUOROPROP-TETRAFLUOROETH EX AERO: 1.0000 "application " | INHALATION_SPRAY | CUTANEOUS | Status: DC | PRN

## 2019-12-22 MED ORDER — LIDOCAINE HCL (PF) 1 % IJ SOLN: 5.0000 mL | INTRAMUSCULAR | Status: DC | PRN

## 2019-12-22 MED ORDER — DEXAMETHASONE SODIUM PHOSPHATE 10 MG/ML IJ SOLN
10.0000 mg | Freq: Four times a day (QID) | INTRAMUSCULAR | Status: AC
  Administered 2019-12-22 (×2): 10 mg via INTRAVENOUS
  Filled 2019-12-22 (×3): qty 1

## 2019-12-22 MED ORDER — HEPARIN SODIUM (PORCINE) 1000 UNIT/ML DIALYSIS: 40.0000 [IU]/kg | INTRAMUSCULAR | Status: DC | PRN

## 2019-12-22 MED ORDER — INSULIN ASPART 100 UNIT/ML ~~LOC~~ SOLN
3.0000 [IU] | SUBCUTANEOUS | Status: DC
  Administered 2019-12-22: 3 [IU] via SUBCUTANEOUS

## 2019-12-22 MED ORDER — INSULIN ASPART 100 UNIT/ML ~~LOC~~ SOLN: 5.0000 [IU] | SUBCUTANEOUS | Status: DC

## 2019-12-22 MED ORDER — INSULIN DETEMIR 100 UNIT/ML ~~LOC~~ SOLN
15.0000 [IU] | Freq: Two times a day (BID) | SUBCUTANEOUS | Status: DC
  Filled 2019-12-22: qty 0.15

## 2019-12-22 MED ORDER — LIDOCAINE-PRILOCAINE 2.5-2.5 % EX CREA
1.0000 "application " | TOPICAL_CREAM | CUTANEOUS | Status: DC | PRN
  Filled 2019-12-22: qty 5

## 2019-12-22 MED ORDER — SODIUM CHLORIDE 0.9 % IV SOLN: 100.0000 mL | INTRAVENOUS | Status: DC | PRN

## 2019-12-22 MED ORDER — DEXTROSE 50 % IV SOLN: 0.0000 mL | INTRAVENOUS | Status: DC | PRN

## 2019-12-22 MED ORDER — INSULIN REGULAR(HUMAN) IN NACL 100-0.9 UT/100ML-% IV SOLN
INTRAVENOUS | Status: DC
  Administered 2019-12-22: 10 [IU]/h via INTRAVENOUS
  Filled 2019-12-22: qty 100

## 2019-12-22 MED ORDER — DEXTROSE 10 % IV SOLN: INTRAVENOUS | Status: DC | PRN

## 2019-12-22 MED ORDER — INSULIN ASPART 100 UNIT/ML ~~LOC~~ SOLN
5.0000 [IU] | SUBCUTANEOUS | Status: DC
  Administered 2019-12-22 – 2019-12-23 (×7): 5 [IU] via SUBCUTANEOUS

## 2019-12-22 MED ORDER — SODIUM CHLORIDE 0.9 % IV SOLN: INTRAVENOUS | Status: DC

## 2019-12-22 MED ORDER — DEXTROSE-NACL 5-0.45 % IV SOLN: INTRAVENOUS | Status: DC

## 2019-12-22 MED ORDER — INSULIN GLARGINE 100 UNIT/ML ~~LOC~~ SOLN
10.0000 [IU] | Freq: Every day | SUBCUTANEOUS | Status: DC
  Administered 2019-12-22 – 2019-12-23 (×2): 10 [IU] via SUBCUTANEOUS
  Filled 2019-12-22 (×3): qty 0.1

## 2019-12-22 MED ORDER — HEPARIN SODIUM (PORCINE) 1000 UNIT/ML DIALYSIS: 1000.0000 [IU] | INTRAMUSCULAR | Status: DC | PRN

## 2019-12-22 NOTE — Progress Notes (Signed)
OT Cancellation Note  Patient Details Name: [name] MRN: [id number] DOB: December 17, 1956   Cancelled Treatment:    Reason Eval/Treat Not Completed: Medical issues which prohibited therapy. Will follow.  Lou Cal, OT [facility] Pager [phone] Office [phone]  Raymondo Band 12/22/2019, 9:16 AM

## 2019-12-22 NOTE — Progress Notes (Signed)
Called infection prevention due to concerns of collecting [place] lab results. Due to the patient being positive in October, Per Kurtis Bushman, patient should not be retested until January.

## 2019-12-22 NOTE — Progress Notes (Signed)
NAME:  [name], MRN:  [id number], DOB:  17-Jun-1956, LOS: 73 ADMISSION DATE:  12/09/2019, CONSULTATION DATE:  REFERRING MD: Dr. Saunders Revel, Cardiology, CHIEF COMPLAINT: Cardiac arrest, acute respiratory failure  Brief History   63, DM, CAD with CM, COVID-19 pneumonia early November.  Admitted 12/17 from SNF after acute arrest, presumed ventricular arrhythmia.  CPR 15 minutes. Partially successful PTCI LAD 12/17, no stent.  To ICU for TTM 63.   Past Medical History   has a past medical history of Anemia, Anemia in CKD (chronic kidney disease) (12/14/2014), Benign hypertension with ESRD (end-stage renal disease) ([place]) (02/12/2018), Bilateral leg edema, Blind, Cardiac arrest ([place]) (12/12/2014), CHF (congestive heart failure) ([place]), Coronary artery disease, [place], Diabetes mellitus, ESRD on hemodialysis ([place]), Essential hypertension (08/09/2009), Fibroid uterus, Heart murmur, Hyperlipidemia, Hypertension, Leiomyoma of uterus (08/09/2009), MI (myocardial infarction) ([place]) (11/2014), Moderate tricuspid regurgitation, Osteomyelitis ([place]), Psychiatric disorder, and Pulmonary hypertension ([place]).   Significant Hospital Events   12/17- Cardiac arrest, Cardiac catheterization, TTM 12/19 -  eeg stopped. On CRRt. On fent gtt. On levophed gtt. Off Nimbex. Rewarmed early hours today. CRRT started 12/21> remains on pressors. Remains on CRRT (net even due to shock). Remains intubated and sedated.  12/16/2019 palliative care is following  12/21/2019 Failed extubation due to stridor  Consults:  Cardiology signed off 12/17/2019 Nephrology PCCM Palliative  Procedures:  Left heart catheterization 12/17 >> severe two-vessel coronary disease with chronic total occlusion of the ostial RCA and subacute or chronic totally occluded mid LAD.  Distal LAD is small and diffusely diseased.  Moderately reduced left ventricular systolic function with mild apical akinesis.  PTCA of mid LAD and D2 with reestablishment of  TIMI flow and 7% residual stenosis.  Given small size of vessel heavy calcification stent placement was not attempted.  Significant Diagnostic Tests:   EEG 12/17 > profound severe encephalopathy, non specific to etiology. No seizures or epileptiform discharges   TTE 12/17 >> LVEF < 20%. LV dilated with akinesis of mid and apical segments. No LV thrombus. Grade II LV diastolic dysfunction.   Micro Data:    Antimicrobials:    Interim history/subjective:  Extubated yesterday but failed immediately due to stridor and hence reintubated Remains on the vent overnight.  Objective   Blood pressure (!) 110/49, pulse 73, temperature 98.6 F (37 C), temperature source Oral, resp. rate 20, height 5\' 5"  (1.651 m), weight 105.2 kg, SpO2 99 %.    Vent Mode: PRVC FiO2 (%):  [40 %-100 %] 40 % Set Rate:  [12 bmp-16 bmp] 16 bmp Vt Set:  [450 mL] 450 mL PEEP:  [5 cmH20] 5 cmH20 Plateau Pressure:  [18 cmH20-24 cmH20] 18 cmH20   Intake/Output Summary (Last 24 hours) at 12/22/2019 0857 Last data filed at 12/22/2019 0700 Gross per 24 hour  Intake 451.3 ml  Output 0 ml  Net 451.3 ml   Filed Weights   12/20/19 0304 12/21/19 0156 12/22/19 0600  Weight: 101.2 kg 102.1 kg 105.2 kg    Gen:      No acute distress, obese HEENT:  EOMI, sclera anicteric. Tongue swelling Neck:     No masses; no thyromegaly Lungs:    Clear to auscultation bilaterally; normal respiratory effort CV:         Regular rate and rhythm; no murmurs Abd:      + bowel sounds; soft, non-tender; no palpable masses, no distension Ext:    No edema; adequate peripheral perfusion Skin:      Warm and dry; no  rash Neuro: [facility] Problem list     Assessment & Plan:   Acute encephalopathy -toxic metabolic. At risk for anoxic injury in setting of cardiac arrest  12/13/2019 - did follow command x1. Unable to assess 12/20 due to sedation needs -Diffuse slowing on EEG suggestive of severe encephalopathy.  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 - s/p 33 TTM with rewarming P Continue supportive care  Anemia.  No evidence of significant bleed Status post transfusion 12/26 Follow CBC  Shock -likely cardiogenic in setting of CAD, STEMI, Cardiac arrest -possible component of sedation related hypotension  P Has remained hemodynamically stable.  CAD Acute STEMI -Post cardiac catheterization with attempted PTCI to LAD, only partially successful, no stent placed  Severe ICM -LVEF < 20% P Cardiology has signed off since there is no further interventions available Poor prognosis  Hx HTN Continuing to hold antihypertensives End-stage renal disease, chronic dialysis Plan Currently on intermittent hemodialysis per nephrology  Diabetes mellitus Continue sliding-scale coverage  Chronic sacral pressure injury -present prior to admission P Continue wound care as directed by the wound ostomy team  Best practice:  Diet: TF Pain/Anxiety/Delirium protocol (if indicated): Versed, fentanyl VAP protocol (if indicated): Yes DVT prophylaxis: heparin via crrt circuit  GI prophylaxis: PPI Glucose control: SSI  Mobility: BR Code Status: Full Family Communication: Family update pending 12/28. Disposition: ICU   LABS    PULMONARY Recent Labs  Lab 12/17/19 1222 12/18/19 0533  PHART  --  7.457*  PCO2ART  --  39.2  PO2ART  --  87.0  HCO3 29.0* 27.7  TCO2 30 29  O2SAT 78.0 97.0    CBC Recent Labs  Lab 12/19/19 0505 12/20/19 0400 12/20/19 1132 12/21/19 0405  HGB 7.0* 6.1* 6.7* 8.5*   HCT 22.0* 19.2* 21.2* 26.5*  WBC 13.0* 12.9* 13.7*  --   PLT 191 196 193  --     COAGULATION No results for input(s): INR in the last 168 hours.  CARDIAC  No results for input(s): TROPONINI in the last 168 hours. No results for input(s): PROBNP in the last 168 hours.   CHEMISTRY Recent Labs  Lab 12/18/19 0201 12/18/19 0533 12/19/19 0505 12/20/19 0400 12/20/19 1527 12/21/19 0405 12/21/19 1559 12/22/19 0416  NA 137  --  138 138 135 135 135 135  K 4.6  --  4.1 4.2 4.2 4.3 6.2* 5.3*  CL 103   < > 99 100 98 99 97* 97*  CO2 27   < > 27 28 25 26  21* 21*  GLUCOSE 135*   < > 162* 181* 168* 163* 169* 301*  BUN 24*   < > 28* 47* 65* 77* 97* 128*  CREATININE 2.13*   < > 1.88* 2.87* 3.81* 4.72* 5.76* 6.68*  CALCIUM 8.3*   < > 8.8* 8.2* 8.2* 8.3* 8.6* 8.0*  MG 2.6*  --  2.8* 2.8*  --  2.9*  --  3.0*  PHOS 1.9*  1.9*   < > 1.8* 3.3 3.1 3.7 5.1* 5.8*   < > = values in this interval not displayed.   Estimated  Creatinine Clearance: 10.4 mL/min (A) (by C-G formula based on SCr of 6.68 mg/dL (H)).   LIVER Recent Labs  Lab 12/20/19 0400 12/20/19 1527 12/21/19 0405 12/21/19 1559 12/22/19 0416  ALBUMIN 2.5* 2.5* 2.6* 2.5* 2.5*     INFECTIOUS No results for input(s): LATICACIDVEN, PROCALCITON in the last 168 hours.   ENDOCRINE CBG (last 3)  Recent Labs    12/22/19 0502 12/22/19 0606 12/22/19 0656  GLUCAP 219* 191* 217*    IMAGING x48h  - image(s) personally visualized  -   highlighted in bold AM DG Chest Port 1 View  Result Date: 12/22/2019 CLINICAL DATA:  Respiratory distress. EXAM: PORTABLE CHEST 1 VIEW COMPARISON:  December 22, 2019. FINDINGS: Stable cardiomediastinal silhouette. Endotracheal and nasogastric tubes are unchanged in position. Left internal jugular catheter is unchanged. No pneumothorax is noted. Left lung is clear. Mild right basilar atelectasis or infiltrate is noted. Bony thorax is unremarkable. IMPRESSION: Stable support apparatus. Mild right  basilar atelectasis or infiltrate is noted. Aortic Atherosclerosis (ICD10-I70.0). Electronically Signed   By: Marijo Conception M.D.   On: 12/22/2019 07:22   DG CHEST PORT 1 VIEW  Result Date: 12/21/2019 CLINICAL DATA:  Respiratory distress, intubation EXAM: PORTABLE CHEST 1 VIEW COMPARISON:  Portable exam 1142 hours compared to 12/21/2019 at 0625 hours FINDINGS: Tip of endotracheal tube projects 1.2 cm above carina, consider withdrawal 1 cm. Nasogastric tube extends into stomach. LEFT jugular central venous catheter with tip projecting over cavoatrial junction. Enlargement of cardiac silhouette. Mediastinal contours and pulmonary vascularity normal. Atherosclerotic calcification aorta. Atelectasis RIGHT middle lobe and minimally at LEFT lower lobe. No infiltrate, pleural effusion or pneumothorax. IMPRESSION: Endotracheal tube tip projects 1.2 cm above carina consider withdrawal 1 cm. RIGHT middle lobe atelectasis with mild subsegmental atelectasis LEFT lower lobe. Electronically Signed   By: Lavonia Dana M.D.   On: 12/21/2019 11:48   AM DG Chest Port 1 View  Result Date: 12/21/2019 CLINICAL DATA:  63 year old female positive COVID-19 status post cardiac arrest and STEMI. Hemodialysis. EXAM: PORTABLE CHEST 1 VIEW COMPARISON:  12/18/2019 and earlier. FINDINGS: Portable AP semi upright view at 0625 hours. Endotracheal tube tip at the level the clavicles. Enteric tube side hole at the level of the stomach. Stable left IJ dual lumen dialysis type catheter. Continued low lung volumes. Stable cardiac size and mediastinal contours. Patchy opacity at the medial right lung base most resembles atelectasis. Elsewhere Allowing for portable technique the lungs are clear. No pneumothorax. Paucity of bowel gas. IMPRESSION: 1.  Stable lines and tubes. 2. Mild lung base atelectasis. No other acute cardiopulmonary abnormality. Electronically Signed   By: Genevie Ann M.D.   On: 12/21/2019 08:17   The patient is critically ill  with multiple organ system failure and requires high complexity decision making for assessment and support, frequent evaluation and titration of therapies, advanced monitoring, review of radiographic studies and interpretation of complex data.   Critical Care Time devoted to patient care services, exclusive of separately billable procedures, described in this note is 35 minutes.   Marshell Garfinkel MD [facility] Please see [email or web address] for pager details.  12/22/2019, 9:04 AM

## 2019-12-22 NOTE — Progress Notes (Signed)
PT Cancellation Note  Patient Details Name: [name] MRN: [id number] DOB: 27-Jul-1956   Cancelled Treatment:    Reason Eval/Treat Not Completed: Medical issues which prohibited therapy. Per discussion with RN patient unable to follow commands at this time and is not appropriate for PT intervention. Pt also with R femoral line, limiting mobility. PT will continue to follow.   Zenaida Niece 12/22/2019, 10:32 AM

## 2019-12-22 NOTE — Progress Notes (Signed)
PALLIATIVE NOTE:  Chart Reviewed. Patient extubated on 12/27 and required re-intubation due to increased work of breathing, stridor. Prognosis remains poor.   Attempted to call son and follow up regarding goals of care/code status. Waiting for return call.   Alda Lea, AGPCNP-BC Palliative Medicine Team   NO CHARGE

## 2019-12-22 NOTE — Progress Notes (Addendum)
Patient ID: [name], female   DOB: 1956/05/23, 63 y.o.   MRN: [id number] [facility] Progress Note   Assessment/ Plan:   1.  S/p cardiac arrest with STEMI and cardiogenic shock: Previously on pressors that have been weaned off.  Status post coronary angiography with PCI in the setting of diffuse/calcified coronary disease.  With likely hypoxic/anoxic encephalopathy-awaiting input from son to assess for extent of injury.  Weaning vent support per CCM. 2. ESRD: She is usually on a Monday/Wednesday/Friday dialysis schedule and was earlier on CRRT until 12/25 evening.  Has TDC L IJ. HD today in ICU 3. Anemia: Status post PRBC transfusion yesterday with improvement of hemoglobin/hematocrit and no overt loss.  We will continue to follow on darbepoetin. 4. CKD-MBD: Phosphorus levels improved with replacement, calcium level acceptable.  Continue to follow off binders. 5. Nutrition: Ongoing continuous tube feeds at 45 cc/hour along with prostat 30 cc twice daily.  On MVI. 6. BP/ volume - bp's low normal, wt's up off CRRT. ^UF today. CXR w/o edema today  Subjective:   Remains on vent   Objective:   BP (!) 110/49   Pulse 73   Temp 98.6 F (37 C) (Oral)   Resp 20   Ht _0  (1.651 m)   Wt 105.2 kg   SpO2 99%   BMI 38.59 kg/m   Physical Exam: Gen: Resting comfortably on ventilator support, awakens to touch. CVS: Pulse regular rhythm, normal rate, S1 and S2 normal Resp: Anteriorly clear to auscultation, no rales, left IJ dialysis catheter Abd: Soft, flat, nontender Ext: Bilateral lower ext edema, status post TMA in protective booties, left upper arm AVF with palpable thrill  Dialysis OP:  MWF - [facility]  4h  400/ 800  94kg 2K/ 2.25Ca  LUA AVF Hep 9000 Mircera 100 mcg q2wks - last 12/16 Hectorol 65mg IV qHD   Parsabiv 525mqHD    Labs: BMET Recent Labs  Lab 12/19/19 0505 12/19/19 1543 12/20/19 0400 12/20/19 1527 12/21/19 0405 12/21/19 1559 12/22/19 0416  NA 138 139  138 135 135 135 135  K 4.1 4.1 4.2 4.2 4.3 6.2* 5.3*  CL 99 102 100 98 99 97* 97*  CO2 _1 21* 21*  GLUCOSE 162* 151* 181* 168* 163* 169* 301*  BUN 28* 26* 47* 65* 77* 97* 128*  CREATININE 1.88* 1.72* 2.87* 3.81* 4.72* 5.76* 6.68*  CALCIUM 8.8* 8.6* 8.2* 8.2* 8.3* 8.6* 8.0*  PHOS 1.8* 3.0 3.3 3.1 3.7 5.1* 5.8*   CBC Recent Labs  Lab 12/16/19 0347 12/17/19 0333 12/18/19 0201 12/18/19 1603 12/19/19 0505 12/20/19 0400 12/20/19 1132 12/21/19 0405  WBC 7.1 8.5 11.8* 13.2* 13.0* 12.9* 13.7*  --   NEUTROABS 4.6 5.8 7.9*  --   --  9.7*  --   --   HGB 7.1* 7.0* 6.1* 7.4* 7.0* 6.1* 6.7* 8.5*  HCT 23.6* 22.2* 19.2* 23.0* 22.0* 19.2* 21.2* 26.5*  MCV 103.1* 102.3* 100.5* 98.3 98.7 100.0 97.7  --   PLT 166 171 181 198 191 196 193  --       Medications:    . sodium chloride   Intravenous Once  . aspirin  81 mg Per NG tube Daily  . atorvastatin  10 mg Per Tube QPM  . B-complex with vitamin C  1 tablet Oral Daily  . chlorhexidine gluconate (MEDLINE KIT)  15 mL Mouth Rinse BID  . Chlorhexidine Gluconate Cloth  6 each Topical Q0600  . clopidogrel  75 mg Per Tube QPM  . feeding supplement (PRO-STAT SUGAR FREE 64)  30 mL Per Tube BID  . feeding supplement (VITAL HIGH PROTEIN)  1,000 mL Per Tube Q24H  . liver oil-zinc oxide   Topical BID  . mouth rinse  15 mL Mouth Rinse 10 times per day  . pantoprazole sodium  40 mg Per Tube QHS  . scopolamine  1 patch Transdermal Q72H  . sodium chloride flush  10-40 mL Intracatheter Q12H  . sodium chloride flush  3 mL Intravenous Q12H

## 2019-12-22 NOTE — Consult Note (Signed)
   [facility] CM Inpatient Consult   12/22/2019  [name] January 04, 1956 [id number]   Patient screened for extreme high risk score for unplanned readmission score and for [place] length of stay hospitalization 11 days in the Medicare Next Gen ACO with [place].  Brief review of patient's medical record reveals patient is still in critical care, remains intubated.  Plan:  Follow progress and disposition to assess for post hospital care management needs.    Please place a [facility] Care Management consult as appropriate and for questions contact:   Natividad Brood, RN BSN [facility] Liaison  [phone] business mobile phone Toll free office [phone]  Fax number: [phone] [email or web address]

## 2019-12-23 ENCOUNTER — Inpatient Hospital Stay (HOSPITAL_COMMUNITY): Payer: Medicare Other

## 2019-12-23 LAB — CBC
HCT: 28.9 % — ABNORMAL LOW (ref 36.0–46.0)
Hemoglobin: 9.5 g/dL — ABNORMAL LOW (ref 12.0–15.0)
MCH: 31.1 pg (ref 26.0–34.0)
MCHC: 32.9 g/dL (ref 30.0–36.0)
MCV: 94.8 fL (ref 80.0–100.0)
Platelets: 185 10*3/uL (ref 150–400)
RBC: 3.05 MIL/uL — ABNORMAL LOW (ref 3.87–5.11)
RDW: 17.3 % — ABNORMAL HIGH (ref 11.5–15.5)
WBC: 17.2 10*3/uL — ABNORMAL HIGH (ref 4.0–10.5)
nRBC: 3.8 % — ABNORMAL HIGH (ref 0.0–0.2)

## 2019-12-23 LAB — RENAL FUNCTION PANEL
Albumin: 2.4 g/dL — ABNORMAL LOW (ref 3.5–5.0)
Anion gap: 13 (ref 5–15)
BUN: 84 mg/dL — ABNORMAL HIGH (ref 8–23)
CO2: 24 mmol/L (ref 22–32)
Calcium: 7.5 mg/dL — ABNORMAL LOW (ref 8.9–10.3)
Chloride: 98 mmol/L (ref 98–111)
Creatinine, Ser: 4.66 mg/dL — ABNORMAL HIGH (ref 0.44–1.00)
GFR calc Af Amer: 11 mL/min — ABNORMAL LOW (ref >60)
GFR calc non Af Amer: 9 mL/min — ABNORMAL LOW (ref >60)
Glucose, Bld: 225 mg/dL — ABNORMAL HIGH (ref 70–99)
Phosphorus: 5.1 mg/dL — ABNORMAL HIGH (ref 2.5–4.6)
Potassium: 4.4 mmol/L (ref 3.5–5.1)
Sodium: 135 mmol/L (ref 135–145)

## 2019-12-23 LAB — GLUCOSE, CAPILLARY
Glucose-Capillary: 114 mg/dL — ABNORMAL HIGH (ref 70–99)
Glucose-Capillary: 168 mg/dL — ABNORMAL HIGH (ref 70–99)
Glucose-Capillary: 183 mg/dL — ABNORMAL HIGH (ref 70–99)
Glucose-Capillary: 197 mg/dL — ABNORMAL HIGH (ref 70–99)
Glucose-Capillary: 201 mg/dL — ABNORMAL HIGH (ref 70–99)
Glucose-Capillary: 215 mg/dL — ABNORMAL HIGH (ref 70–99)
Glucose-Capillary: 217 mg/dL — ABNORMAL HIGH (ref 70–99)

## 2019-12-23 LAB — APTT: aPTT: 31 seconds (ref 24–36)

## 2019-12-23 LAB — MAGNESIUM: Magnesium: 2.5 mg/dL — ABNORMAL HIGH (ref 1.7–2.4)

## 2019-12-23 MED ORDER — IPRATROPIUM-ALBUTEROL 0.5-2.5 (3) MG/3ML IN SOLN
RESPIRATORY_TRACT | Status: AC
  Administered 2019-12-23: 3 mL
  Filled 2019-12-23: qty 3

## 2019-12-23 MED ORDER — IPRATROPIUM-ALBUTEROL 0.5-2.5 (3) MG/3ML IN SOLN
RESPIRATORY_TRACT | Status: AC
  Filled 2019-12-23: qty 3

## 2019-12-23 MED ORDER — OXYCODONE HCL 5 MG PO TABS: 5.0000 mg | ORAL_TABLET | Freq: Four times a day (QID) | ORAL | Status: DC | PRN

## 2019-12-23 MED ORDER — METHYLPREDNISOLONE SODIUM SUCC 125 MG IJ SOLR
INTRAMUSCULAR | Status: AC
  Filled 2019-12-23: qty 2

## 2019-12-23 MED ORDER — METHYLPREDNISOLONE SODIUM SUCC 40 MG IJ SOLR
40.0000 mg | Freq: Four times a day (QID) | INTRAMUSCULAR | Status: DC
  Administered 2019-12-23 – 2019-12-24 (×3): 40 mg via INTRAVENOUS
  Filled 2019-12-23 (×3): qty 1

## 2019-12-23 MED ORDER — CHLORHEXIDINE GLUCONATE CLOTH 2 % EX PADS: 6.0000 | MEDICATED_PAD | Freq: Every day | CUTANEOUS | Status: DC

## 2019-12-23 MED ORDER — ACETAMINOPHEN 650 MG RE SUPP: 650.0000 mg | RECTAL | Status: DC | PRN

## 2019-12-23 MED ORDER — SENNOSIDES-DOCUSATE SODIUM 8.6-50 MG PO TABS
1.0000 | ORAL_TABLET | Freq: Two times a day (BID) | ORAL | Status: DC
  Administered 2019-12-23: 1 via ORAL
  Filled 2019-12-23: qty 1

## 2019-12-23 MED ORDER — RACEPINEPHRINE HCL 2.25 % IN NEBU: 0.5000 mL | INHALATION_SOLUTION | Freq: Once | RESPIRATORY_TRACT | Status: DC

## 2019-12-23 MED ORDER — RACEPINEPHRINE HCL 2.25 % IN NEBU
INHALATION_SOLUTION | RESPIRATORY_TRACT | Status: AC
  Administered 2019-12-23: 0.5 mg via RESPIRATORY_TRACT
  Filled 2019-12-23: qty 0.5

## 2019-12-23 NOTE — Progress Notes (Addendum)
[place] Progress Note Patient Name: [name] DOB: 01-08-1956 MRN: [id number]   Date of Service  12/23/2019  HPI/Events of Note  Pt not able to take PO Oxycodone, she's on  BIPAP.  eICU Interventions  Oxycodone order discontinued, PRN rectal Tylenol order entered.        Kerry Kass Sreshta Cressler 12/23/2019, 10:01 PM

## 2019-12-23 NOTE — Plan of Care (Addendum)
  Interdisciplinary Goals of Care Family Meeting   Date carried out:: 12/23/2019  Location of the meeting: Bedside  Member's involved: Physician, Bedside Registered Nurse and Other: Sign languge intepreters  Durable Power of Attorney or acting medical decision maker: Patient    Discussion: We discussed goals of care for [employer] .  We communicated with her through ASL interpreters.  Patient was awake, alert and able to interact effectively.  She clearly stated to [place] via sign language that she is tired and wants to go home and stop all the interventions.   When asked if she wants to die she said yes.   When asked if she wants CPR or chest compressions or go back on life support she clearly said no  Based on patient wishes we will change CODE STATUS to DNR We will continue current therapy for now and try to meet with the rest of the family including son to discuss further and determine if they are ready to transition to full comfort measures.  Palliative care and primary team has made multiple attempts to reach the family and arrange a meeting. Son said that he would come in today after work but did not arrive. This has also happened on multiple occasions in past. We will continue to reach out to them.   Code status: Full DNR  Disposition: Continue current acute care  Time spent for the meeting: [place] MD [place] Please see [email or web address] for pager details.  12/23/2019, 5:56 PM

## 2019-12-23 NOTE — Progress Notes (Signed)
NAME:  [name], MRN:  [id number], DOB:  [birth year], LOS: 12 ADMISSION DATE:  12/18/2019, CONSULTATION DATE: 12/12/2019 REFERRING MD: Dr. Saunders Revel, Cardiology, CHIEF COMPLAINT: Cardiac arrest, acute respiratory failure  Brief History   5, DM, CAD with CM, COVID-19 pneumonia early November.  Admitted 12/17 from SNF after acute arrest, presumed ventricular arrhythmia.  CPR 15 minutes. Partially successful PTCI LAD 12/17, no stent.  To ICU for TTM 33.   Past Medical History   has a past medical history of Anemia, Anemia in CKD (chronic kidney disease) (12/14/2014), Benign hypertension with ESRD (end-stage renal disease) ([place]) (02/12/2018), Bilateral leg edema, Blind, Cardiac arrest ([place]) (12/12/2014), CHF (congestive heart failure) ([place]), Coronary artery disease, Deaf, Diabetes mellitus, ESRD on hemodialysis ([place]), Essential hypertension (08/09/2009), Fibroid uterus, Heart murmur, Hyperlipidemia, Hypertension, Leiomyoma of uterus (08/09/2009), MI (myocardial infarction) ([place]) (11/2014), Moderate tricuspid regurgitation, Osteomyelitis ([place]), Psychiatric disorder, and Pulmonary hypertension ([place]).   Significant Hospital Events   12/17- Cardiac arrest, Cardiac catheterization, TTM 12/19 -  eeg stopped. On CRRt. On fent gtt. On levophed gtt. Off Nimbex. Rewarmed early hours today. CRRT started 12/21> remains on pressors. Remains on CRRT (net even due to shock). Remains intubated and sedated.  12/16/2019 palliative care is following  12/21/2019 Failed extubation due to stridor  Consults:  Cardiology signed off 12/17/2019 Nephrology PCCM Palliative  Procedures:  Left heart catheterization 12/17 >> severe two-vessel coronary disease with chronic total occlusion of the ostial RCA and subacute or chronic totally occluded mid LAD.  Distal LAD is small and diffusely diseased.  Moderately reduced left ventricular systolic function with mild apical akinesis.  PTCA of mid LAD and D2 with reestablishment of  TIMI flow and 7% residual stenosis.  Given small size of vessel heavy calcification stent placement was not attempted.  Significant Diagnostic Tests:   EEG 12/17 > profound severe encephalopathy, non specific to etiology. No seizures or epileptiform discharges   TTE 12/17 >> LVEF < 20%. LV dilated with akinesis of mid and apical segments. No LV thrombus. Grade II LV diastolic dysfunction.   Micro Data:    Antimicrobials:    Interim history/subjective:  Stable overnight.  Remains on the ventilator, Weaning on pressure support today morning  Objective   Blood pressure (!) 117/53, pulse 64, temperature 98.1 F (36.7 C), temperature source Oral, resp. rate (!) 8, height 5\' 5"  (1.651 m), weight 100.8 kg, SpO2 99 %.    Vent Mode: PSV;CPAP FiO2 (%):  [40 %] 40 % Set Rate:  [12 bmp-16 bmp] 12 bmp Vt Set:  [450 mL] 450 mL PEEP:  [5 cmH20] 5 cmH20 Pressure Support:  [5 cmH20-15 cmH20] 5 cmH20 Plateau Pressure:  [21 cmH20] 21 cmH20   Intake/Output Summary (Last 24 hours) at 12/23/2019 0844 Last data filed at 12/23/2019 0700 Gross per 24 hour  Intake 970.02 ml  Output 3500 ml  Net -2529.98 ml   Filed Weights   12/22/19 0850 12/22/19 1230 12/23/19 0130  Weight: 106.3 kg 103.3 kg 100.8 kg    Gen:      No acute distress HEENT:  EOMI, sclera anicteric Neck:     No masses; no thyromegaly, ETT Lungs:    Clear to auscultation bilaterally; normal respiratory effort CV:         Regular rate and rhythm; no murmurs Abd:      + bowel sounds; soft, non-tender; no palpable masses, no distension Ext:    No edema; adequate peripheral perfusion Skin:      Warm and  dry; no rash Neuro: Awake, Arousable.  Follows simple commands.   Resolved Hospital Problem list     Assessment & Plan:   Acute encephalopathy -toxic metabolic. 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 Now has a cuff leak Will reattempt extubation today  S/p Cardiac Arrest -presumed VT/VF given her history of CAD and prior ventricular arrhythmias, shockable initial rhythm with AED. - s/p 33 TTM with rewarming P Continue supportive care  Anemia.  No evidence of significant bleed Status post transfusion 12/26 Follow CBC  Shock -likely cardiogenic in setting of CAD, STEMI, Cardiac arrest -possible component of sedation related hypotension  P Has remained hemodynamically stable.  CAD Acute STEMI -Post cardiac catheterization with attempted PTCI to LAD, only partially successful, no stent placed  Severe ICM -LVEF < 20% P Cardiology has signed off since there is no further interventions available Poor prognosis  Hx HTN Continuing to hold antihypertensives End-stage renal disease, chronic dialysis Plan Currently on intermittent hemodialysis per nephrology  Diabetes mellitus Continue sliding-scale coverage  Chronic sacral pressure injury -present prior to admission P Continue wound care as directed by the wound ostomy team  Goals of care P Palliative care consulted  Best practice:  Diet: TF Pain/Anxiety/Delirium protocol (if indicated): Versed, fentanyl VAP protocol (if indicated): Yes DVT prophylaxis: heparin via crrt circuit  GI prophylaxis: PPI Glucose control: SSI  Mobility: BR Code Status: Full Family Communication: Family update pending 12/28. Disposition: ICU   LABS    PULMONARY Recent Labs  Lab 12/17/19 1222 12/18/19 0533  PHART  --  7.457*  PCO2ART  --  39.2  PO2ART  --  87.0  HCO3 29.0* 27.7  TCO2 30 29  O2SAT 78.0 97.0    CBC Recent Labs  Lab 12/20/19 0400 12/20/19 1132 12/21/19 0405 12/23/19 0319  HGB 6.1* 6.7* 8.5* 9.5*  HCT 19.2* 21.2* 26.5* 28.9*  WBC 12.9* 13.7*  --  17.2*  PLT 196 193  --  185    COAGULATION No results for input(s): INR in the last 168  hours.  CARDIAC  No results for input(s): TROPONINI in the last 168 hours. No results for input(s): PROBNP in the last 168 hours.   CHEMISTRY Recent Labs  Lab 12/19/19 0505 12/20/19 0400 12/21/19 0405 12/21/19 1559 12/22/19 0416 12/22/19 1816 12/23/19 0319  NA 138 138 135 135 135 132* 135  K 4.1 4.2 4.3 6.2* 5.3* 3.9 4.4  CL 99 100 99 97* 97* 93* 98  CO2 27 28 26  21* 21* 25 24  GLUCOSE 162* 181* 163* 169* 301* 279* 225*  BUN 28* 47* 77* 97* 128* 65* 84*  CREATININE 1.88* 2.87* 4.72* 5.76* 6.68* 4.16* 4.66*  CALCIUM 8.8* 8.2* 8.3* 8.6* 8.0* 7.7* 7.5*  MG 2.8* 2.8* 2.9*  --  3.0*  --  2.5*  PHOS 1.8* 3.3 3.7 5.1* 5.8* 4.4 5.1*   Estimated Creatinine Clearance: 14.5 mL/min (A) (by C-G formula based on SCr of 4.66 mg/dL (H)).   LIVER Recent Labs  Lab 12/21/19 0405 12/21/19 1559 12/22/19 0416 12/22/19 1816 12/23/19 0319  ALBUMIN 2.6* 2.5* 2.5* 2.4* 2.4*     INFECTIOUS No results for input(s): LATICACIDVEN, PROCALCITON in the last 168 hours.   ENDOCRINE CBG (last 3)  Recent Labs    12/23/19 0016 12/23/19 0319 12/23/19 0753  GLUCAP 215*  197* 201*   The patient is critically ill with multiple organ system failure and requires high complexity decision making for assessment and support, frequent evaluation and titration of therapies, advanced monitoring, review of radiographic studies and interpretation of complex data.   Critical Care Time devoted to patient care services, exclusive of separately billable procedures, described in this note is 35 minutes.   Marshell Garfinkel MD [facility] Please see [email or web address] for pager details.  12/23/2019, 8:44 AM

## 2019-12-23 NOTE — Progress Notes (Signed)
PCCM Progress note  Patient extubated today.  Post extubation developed started with mild respiratory distress Given epi nebs and restarted steroids Try BiPAP but she may not tolerate for [place] periods We will continue to have communication difficulties and have taken the help of sign language interpreter  Try to avoid intubation for now.  If she does end up getting re intubated then we are looking at a tracheostomy which is not ideal Palliative care and ICU team is trying to reach son to discuss goals of care  The patient is critically ill with multiple organ system failure and requires high complexity decision making for assessment and support, frequent evaluation and titration of therapies, advanced monitoring, review of radiographic studies and interpretation of complex data.   Critical Care Time devoted to patient care services, exclusive of separately billable procedures, described in this note is 35 minutes.   Marshell Garfinkel MD [facility] Please see [email or web address] for pager details.  12/23/2019, 4:01 PM

## 2019-12-23 NOTE — Progress Notes (Signed)
Pt having mild stridor post extubation.  Pt given HHN with 0.5 Racemic Epi.

## 2019-12-23 NOTE — Progress Notes (Signed)
Through [facility] interpreters, communication was established via tactile sign language. Dr. Vaughan Browner and I requested that the interpreters ask the patient if she wanted to remain a full code or DNR. Through the interpreter services, the patient requested to not have ACLS performed. We also asked the patient if she was ready to pass away instead of having a breathing tube used, according to the interpreters, she signed "die" multiple times.   [place] Interpreters: Talbert Nan Denaples

## 2019-12-23 NOTE — Progress Notes (Signed)
PT Cancellation Note  Patient Details Name: [name] MRN: [id number] DOB: Nov 14, 1956   Cancelled Treatment:    Reason Eval/Treat Not Completed: Medical issues which prohibited therapy. Pt extubated this morning. She is now on bipap due to respiratory distress. PT to follow and proceed with eval when pt medically able.   Lorriane Shire 12/23/2019, 1:14 PM  Lorrin Goodell, PT  Office # [phone] Pager [phone]

## 2019-12-23 NOTE — Progress Notes (Signed)
[facility] Progress Note Patient Name: [name] DOB: Jan 03, 1956 MRN: [id number]   Date of Service  12/23/2019  HPI/Events of Note  Pt needs a PRN medication for pain  eICU Interventions  Oxycodone 5 mg PO Q 6 hours PRN pain ordered.        Kerry Kass [place] Skalla 12/23/2019, 9:40 PM

## 2019-12-23 NOTE — Progress Notes (Signed)
Daily Progress Note   Patient Name: [name]       Date: 12/23/2019 DOB: 03/05/1956  Age: 63 y.o. MRN#: [id number] Attending Physician: Marshell Garfinkel, MD Primary Care Physician: Patient, No Pcp Per Admit Date: 11/25/2019  Reason for Consultation/Follow-up: Establishing goals of care  Subjective: Patient recently extubated. She is awake and alert. Interpretor is at the bedside and Therapist, sports. Updates received. Patient having stridor post extubation. No family at the bedside.   With use of interpretor attempted to gain awareness of patient's metal status. Patient is unable to appropriately verify name or date of birth. She shrugs shoulders when asked. She will follow some commands. During assessment showing intermittent signs of somnolence. Will close eyes and not respond as quickly to interpretor. Requested patient stay awake and engage in conversation. Patient continuing to ask where her clothes were. She is not aware she is in the hospital despite interpretor attempting on multiple occassions to orient.   Increased work of breath noted with use of accessory muscles. Respiratory at the bedside to give [facility] with goal to prohibit re-intubation. Patient ha been intubated twice thus this admission. Patient becomes agitated when facemask for nebulizer is placed. She is removing face masking and indicating no. Myself, RT, and interpretor attempting to calm patient down by soothing touch and interpretation explaining what is being done. Patient does eventually calm down and is tolerating face mask.   I attempted to contact son several times and finally was able to reach him after attempts. Updates provided and direct discussion regarding concerns for his mother's health, including re-intubation, poor  prognosis, and possibility of further interventions such as tracheostomy. He verbalized understanding. He is tearful in discussion and expressing he has spoken to most family and they feel they "just can't give up on her yet!" [name] reports he has to see how she is going to do despite updates and awareness that she is not doing well at this moment and remains critical. He verbalizes again he just can't give up on her yet and did not think she would make it this far but she is. He reports he has to come up and see and communicate with her to gain a better understanding of what and how to proceed. I explained his mother's confusion and inability to communicate appropriately at this time.  He expressed understanding but also with response that she always knows who he is and once he see's her and see how she is doing and responding this would give him a better understanding and peace of mind. I encouraged him to come up to see her immediately and concerns of the medical team. He verbalized understanding but however declined to come now as he could not leave work but is going to plan to come after work between 4pm-6pm. He understands her condition can drastically change for the worst prior to that time.   [name] is requesting to continue with full code/full scope care.   All questions answered.   Length of Stay: 12  Current Medications: Scheduled Meds:  . sodium chloride   Intravenous Once  . aspirin  81 mg Per NG tube Daily  . atorvastatin  10 mg Per Tube QPM  . B-complex with vitamin C  1 tablet Oral Daily  . chlorhexidine gluconate (MEDLINE KIT)  15 mL Mouth Rinse BID  . Chlorhexidine Gluconate Cloth  6 each Topical Q0600  . [START ON 2020-01-07] Chlorhexidine Gluconate Cloth  6 each Topical Q0600  . clopidogrel  75 mg Per Tube QPM  . feeding supplement (PRO-STAT SUGAR FREE 64)  30 mL Per Tube BID  . feeding supplement (VITAL HIGH PROTEIN)  1,000 mL Per Tube Q24H  . insulin aspart  3-9 Units  Subcutaneous Q4H  . insulin aspart  5 Units Subcutaneous Q4H  . insulin glargine  10 Units Subcutaneous Daily  . liver oil-zinc oxide   Topical BID  . mouth rinse  15 mL Mouth Rinse 10 times per day  . methylPREDNISolone (SOLU-MEDROL) injection  40 mg Intravenous Q6H  . pantoprazole sodium  40 mg Per Tube QHS  . Racepinephrine HCl  0.5 mL Nebulization Once  . scopolamine  1 patch Transdermal Q72H  . senna-docusate  1 tablet Oral BID  . sodium chloride flush  10-40 mL Intracatheter Q12H  . sodium chloride flush  3 mL Intravenous Q12H    Continuous Infusions: . sodium chloride 10 mL/hr at 12/19/2019 1445  . sodium chloride Stopped (12/18/19 0820)  . sodium chloride    . sodium chloride    . dexmedetomidine (PRECEDEX) IV infusion Stopped (12/21/19 1541)  . dextrose    . fentaNYL infusion INTRAVENOUS Stopped (12/21/19 1541)  . fentaNYL infusion INTRAVENOUS 50 mcg/hr (12/23/19 0900)    PRN Meds: sodium chloride, sodium chloride, sodium chloride, acetaminophen, fentaNYL, heparin, heparin, lidocaine (PF), lidocaine-prilocaine, midazolam, ondansetron (ZOFRAN) IV, pentafluoroprop-tetrafluoroeth, sodium chloride flush, sodium chloride flush  Physical Exam       -awake, alert, confused per responses from interpretor, chronically-ill appearing -RRR -  Rhonchi, increased work-of breathing, stridor, use of accessory muscles s/p extubation -follows simple commands, does not respond appropriately to interpretor (some confusion)  Vital Signs: BP (!) 152/66   Pulse 92   Temp 98.1 F (36.7 C) (Oral)   Resp (!) 24   Ht 5' 5"  (1.651 m)   Wt 100.8 kg   SpO2 98%   BMI 36.98 kg/m  SpO2: SpO2: 98 % O2 Device: O2 Device: Nasal Cannula O2 Flow Rate: O2 Flow Rate (L/min): 4 L/min  Intake/output summary:   Intake/Output Summary (Last 24 hours) at 12/23/2019 1341 Last data filed at 12/23/2019 0900 Gross per 24 hour  Intake 535.82 ml  Output 0 ml  Net 535.82 ml   LBM: Last BM Date:  (UTA) Baseline Weight: Weight: 101 kg Most recent weight: Weight: 100.8 kg  Palliative Assessment/Data: Critical      Patient Active Problem List   Diagnosis Date Noted  . [place] QT interval 12/14/2019  . CAD (coronary artery disease) 12/14/2019  . End-stage renal disease on hemodialysis ([place])   . Palliative care by specialist   . COVID-19 virus infection 10/25/2019  . Adjustment disorder with disturbance of conduct   . Benign hypertension with ESRD (end-stage renal disease) ([place]) 02/12/2018  . Uncontrolled type 2 diabetes mellitus with end-stage renal disease ([place]) 11/26/2017  . 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 Chronic combined systolic and diastolic congestive heart failure ([place]) 12/15/2015  . Dyslipidemia associated with type 2 diabetes mellitus ([place]) 12/15/2015  . Ventricular tachycardia, sustained ([place]) 12/15/2015  . Psychosis, paranoid ([place]) 08/20/2015  . Non compliance w medication regimen 08/20/2015  . Hyperkalemia 04/19/2015  . ESRD (end stage renal disease) ([place]) 04/19/2015  . PAD (peripheral artery disease) ([place]) 03/23/2015  . Acute encephalopathy   . FTT (failure to thrive) in adult 01/14/2015  . DNR (do not resuscitate) discussion 12/16/2014  . Blindness with deafness 12/16/2014  . Anemia in CKD (chronic kidney disease) 12/14/2014  . Cardiac arrest ([place]) 12/12/2014  . ST elevation myocardial infarction (STEMI) ([place]) 11/21/2014  . Cardiomyopathy, ischemic-EF 30-35% 11/21/2014  . Chronic constipation 12/24/2013  . Diabetes mellitus with ESRD (end-stage renal disease) ([place]) 10/07/202014  . Acute respiratory failure with hypoxia ([place]) 12/22/2013  . Hypertensive heart disease with CHF (congestive heart failure) ([place]) 08/09/2009    Palliative Care Assessment & Plan  Recommendations/Plan:  Full Code/Full scope-per son  Continue current plan of care per medical team  Son on last week was leaning more towards comfort, however today it seems his course of decisions is  changing. He expressed wishes to continue with full code, full scope, expressing he just could not give up on her yet and that she may improve, despite direct discussions regarding poor prognosis and current state of health. Patient has been intubated twice and now struggling to support possibly facing her third intubation.  Tracheostomy in this patient would not add any further meaningful value to her life or prognosis. Overall prognosis and condition will remain poor and continued aggressive care would be futile on Ms. Leppanen's behalf. Case may benefit from further involvement of ethics. Case being discussed with [place] Director (Dr. Rhea Pink).   PMT will continue to support and follow.   Goals of Care and Additional Recommendations:  Limitations on Scope of Treatment: Continue to treat the treatable  Code Status:    Code Status Orders  (From admission, onward)         Start     Ordered   12/08/2019 1346  Full code  Continuous     12/12/2019 1352        Code Status History    Date Active Date Inactive Code Status Order ID Comments User Context   10/25/2019 1109 10/29/2019 2341 Full Code [id number]  Norval Morton, MD ED   05/01/2018 0025 05/07/2018 2212 Full Code [id number]  [name], MD ED   08/20/2015 1659 08/23/2015 2233 Full Code [id number]  Newt Minion, MD Inpatient   08/19/2015 1834 08/20/2015 1659 Full Code [id number]  Lacretia Leigh, MD ED   05/17/2015 2031 05/18/2015 1823 Full Code [id number]  Newt Minion, MD Inpatient   05/16/2015 1020 05/17/2015 2031 Full Code [id number]  Janece Canterbury, MD Inpatient   05/16/2015 0845 05/16/2015 1020 DNR [id number]  Janece Canterbury, MD Inpatient   04/19/2015 0820 04/21/2015 2255 DNR  [id number]  Delfina Redwood, MD Inpatient   04/19/2015 0719 04/19/2015 0820 Full Code [id number]  Jani Gravel, MD Inpatient   01/13/2015 1358 01/16/2015 2104 Full Code [id number]  Phillips Climes, MD Inpatient   12/12/2014 0314 12/21/2014 1720 Full  Code [id number]  Berle Mull, MD Inpatient   11/20/2014 0907 11/26/2014 1806 Full Code [id number]  Ivor Costa, MD Inpatient   09/02/2013 0221 01/08/2014 1840 Full Code [id number]  Bynum Bellows, MD Inpatient   Advance Care Planning Activity       Prognosis:   Guarded to Poor   Discharge Planning:  To Be Determined  Care plan was discussed with patient's son, RN, RT, and   Thank you for allowing the Palliative Medicine Team to assist in the care of this patient.  Total Time: 55 min.   Greater than 50%  of this time was spent counseling and coordinating care related to the above assessment and plan.  Alda Lea, AGPCNP-BC Palliative Medicine Team  Phone: [phone] Pager: [phone] Amion: Bjorn Pippin    Please contact Palliative Medicine Team phone at [phone] for questions and concerns.

## 2019-12-23 NOTE — Progress Notes (Signed)
Patient ID: [name], female   DOB: 03/09/1956, 63 y.o.   MRN: [id number] [facility] Progress Note   Assessment/ Plan:   1.  S/p cardiac arrest with STEMI and cardiogenic shock: Previously on pressors that have been weaned off.  Status post coronary angiography with PCI in the setting of diffuse/calcified coronary disease.  With likely hypoxic/anoxic encephalopathy-awaiting input from son to assess for extent of injury.  Weaning vent support per CCM. 2. ESRD: Usual HD MWF. Was on CRRT 12/22- 12/25, Had HD yest in ICU , 3.5 L off. Remains up 6kg over dry wt. Plan HD tomorrow, max UF.  3. Anemia: Status post PRBC transfusion yesterday with improvement of hemoglobin/hematocrit and no overt loss.  We will continue to follow on darbepoetin. 4. CKD-MBD: Phosphorus levels improved with replacement, calcium level acceptable.  Continue to follow off binders. 5. Nutrition: Ongoing continuous tube feeds at 45 cc/hour along with prostat 30 cc twice daily.  On MVI. 6. BP/ volume - weights are up 6kg over dry wt, edema on exam. Max UF on HD   Subjective:   Remains on vent   Objective:   BP (!) 152/66   Pulse 92   Temp 98.1 F (36.7 C) (Oral)   Resp (!) 24   Ht 5' 5"  (1.651 m)   Wt 100.8 kg   SpO2 98%   BMI 36.98 kg/m   Physical Exam: Gen: Resting comfortably on ventilator support CVS: Pulse regular rhythm, normal rate, S1 and S2 normal Resp: Anteriorly clear to auscultation, no rales, left IJ TDC Abd: Soft, flat, nontender Ext: Bilateral lower ext edema, status post TMA in protective booties, left upper arm AVF with palpable thrill  Dialysis OP:  MWF - [facility]  4h  400/ 800  94kg 2K/ 2.25Ca  LUA AVF Hep 9000 Mircera 100 mcg q2wks - last 12/16 Hectorol 87mg IV qHD   Parsabiv 569mqHD    Labs: BMET Recent Labs  Lab 12/20/19 0400 12/20/19 1527 12/21/19 0405 12/21/19 1559 12/22/19 0416 12/22/19 1816 12/23/19 0319  NA 138 135 135 135 135 132* 135  K 4.2 4.2 4.3 6.2*  5.3* 3.9 4.4  CL 100 98 99 97* 97* 93* 98  CO2 28 25 26  21* 21* 25 24  GLUCOSE 181* 168* 163* 169* 301* 279* 225*  BUN 47* 65* 77* 97* 128* 65* 84*  CREATININE 2.87* 3.81* 4.72* 5.76* 6.68* 4.16* 4.66*  CALCIUM 8.2* 8.2* 8.3* 8.6* 8.0* 7.7* 7.5*  PHOS 3.3 3.1 3.7 5.1* 5.8* 4.4 5.1*   CBC Recent Labs  Lab 12/17/19 0333 12/18/19 0201 12/18/19 0533 12/19/19 0505 12/20/19 0400 12/20/19 1132 12/21/19 0405 12/23/19 0319  WBC 8.5 11.8*   < > 13.0* 12.9* 13.7*  --  17.2*  NEUTROABS 5.8 7.9*  --   --  9.7*  --   --   --   HGB 7.0* 6.1*  --  7.0* 6.1* 6.7* 8.5* 9.5*  HCT 22.2* 19.2*  --  22.0* 19.2* 21.2* 26.5* 28.9*  MCV 102.3* 100.5*   < > 98.7 100.0 97.7  --  94.8  PLT 171 181   < > 191 196 193  --  185   < > = values in this interval not displayed.      Medications:    . sodium chloride   Intravenous Once  . aspirin  81 mg Per NG tube Daily  . atorvastatin  10 mg Per Tube QPM  . B-complex with vitamin C  1  tablet Oral Daily  . chlorhexidine gluconate (MEDLINE KIT)  15 mL Mouth Rinse BID  . Chlorhexidine Gluconate Cloth  6 each Topical Q0600  . clopidogrel  75 mg Per Tube QPM  . feeding supplement (PRO-STAT SUGAR FREE 64)  30 mL Per Tube BID  . feeding supplement (VITAL HIGH PROTEIN)  1,000 mL Per Tube Q24H  . insulin aspart  3-9 Units Subcutaneous Q4H  . insulin aspart  5 Units Subcutaneous Q4H  . insulin glargine  10 Units Subcutaneous Daily  . liver oil-zinc oxide   Topical BID  . mouth rinse  15 mL Mouth Rinse 10 times per day  . methylPREDNISolone sodium succinate      . methylPREDNISolone (SOLU-MEDROL) injection  40 mg Intravenous Q6H  . pantoprazole sodium  40 mg Per Tube QHS  . Racepinephrine HCl  0.5 mL Nebulization Once  . scopolamine  1 patch Transdermal Q72H  . senna-docusate  1 tablet Oral BID  . sodium chloride flush  10-40 mL Intracatheter Q12H  . sodium chloride flush  3 mL Intravenous Q12H

## 2019-12-23 NOTE — Progress Notes (Signed)
OT Cancellation Note  Patient Details Name: [name] MRN: [id number] DOB: July 27, 1956   Cancelled Treatment:    Reason Eval/Treat Not Completed: Medical issues which prohibited therapy. Attempted again. Pt now on BiPap due to respiratory distress. Will assess when pt medically appropriate.   Mauricio Dahlen,HILLARY 12/23/2019, 1:11 PM

## 2019-12-23 NOTE — Progress Notes (Signed)
OT Cancellation Note  Patient Details Name: [name] MRN: [id number] DOB: [birth year]   Cancelled Treatment:    Reason Eval/Treat Not Completed: Other (comment). Pt just extubated. Will return later is able.   Ramond Dial, OT/L   Acute OT Clinical Specialist Acute Rehabilitation Services Pager [phone] Office [phone]  12/23/2019, 11:13 AM

## 2019-12-23 NOTE — Code Documentation (Deleted)
  Interdisciplinary Goals of Care Family Meeting   Date carried out:: 12/23/2019  Location of the meeting: Bedside  Member's involved: Physician, Bedside Registered Nurse and Other: Sign languge intepreters  Durable Power of Attorney or acting medical decision maker: Patient    Discussion: We discussed goals of care for [employer] .  We communicated with her through ASL interpreters.  Patient was awake, alert and able to interact effectively.  She clearly stated to [place] via sign language that she is tired and wants to go home and stop all the interventions.   When asked if she wants to die she said yes.   When asked if she wants CPR or chest compressions or go back on life support she clearly said no  Based on patient wishes we will change CODE STATUS to DNR We will continue current therapy for now and try to meet with the rest of the family including son to discuss further and determine if they are ready to transition to full comfort measures.  Code status: Full DNR  Disposition: Continue current acute care  Time spent for the meeting: [place] 12/23/2019, 5:17 PM

## 2019-12-23 NOTE — Progress Notes (Deleted)
Inpatient Diabetes Program Recommendations  AACE/ADA: New Consensus Statement on Inpatient Glycemic Control (2015)  Target Ranges:  Prepandial:   less than 140 mg/dL      Peak postprandial:   less than 180 mg/dL (1-2 hours)      Critically ill patients:  140 - 180 mg/dL   Lab Results  Component Value Date   GLUCAP 201 (H) 12/23/2019   HGBA1C 5.4 10/26/2019    Review of Glycemic Control  Results for PREZLEY, QADIR (MRN [id number]) as of 12/23/2019 11:33  Ref. Range 12/22/2019 20:10 12/23/2019 00:16 12/23/2019 03:19 12/23/2019 05:25 12/23/2019 07:53  Glucose-Capillary Latest Ref Range: 70 - 99 mg/dL 263 (H)  NOVOLOG 14units 215 (H)  NOVOLOG 14units 197 (H)  NOVOLOG 11units  201 (H)  NOVOLOG 14units    Diabetes history: DM2 Outpatient Diabetes medications: Lispro 0-5 twice daily Current orders for Inpatient glycemic control: Lantus 10 units daily; Novolog 3-9 Q4H; Novolog 5 units Q4H  Inpatient Diabetes Program Recommendations:     Noted that patient is no longer receiving Decadron.    -Please consider decreasing Lantus to 5 units daily due to ESRD and reduce Novolog correction to 0-9 units Q4H -Also consider reducing tube feed coverage to 2 units Q4H  Thank you, Geoffry Paradise, RN, BSN Diabetes Coordinator Inpatient Diabetes Program [phone] (team pager from 8a-5p)   Thank you, Geoffry Paradise, RN, BSN Diabetes Coordinator Inpatient Diabetes Program [phone] (team pager from 8a-5p)

## 2019-12-23 NOTE — Procedures (Signed)
Extubation Procedure Note  Patient Details:   Name: JENNYFER NICKOLSON DOB: Oct 19, 1956 MRN: [id number]   Airway Documentation:    Vent end date: 12/23/19 Vent end time: 1045   Evaluation  O2 sats: stable throughout Complications: No apparent complications Patient did tolerate procedure well. Bilateral Breath Sounds: Rhonchi   Yes   Patient was extubated to a 4L [place] without any complications, dyspnea or stridor noted. RN, MD, RT and interpreter were present during extubation. Positive cuff leak.  Shaughn Thomley, Eddie [place] 12/23/2019, 10:45 AM

## 2019-12-24 LAB — GLUCOSE, CAPILLARY: Glucose-Capillary: 171 mg/dL — ABNORMAL HIGH (ref 70–99)

## 2019-12-24 MED ORDER — MORPHINE SULFATE (PF) 2 MG/ML IV SOLN
2.0000 mg | Freq: Once | INTRAVENOUS | Status: AC
  Administered 2019-12-24: 2 mg via INTRAVENOUS
  Filled 2019-12-24: qty 1

## 2019-12-24 MED ORDER — ATROPINE SULFATE 1 MG/10ML IJ SOSY
PREFILLED_SYRINGE | INTRAMUSCULAR | Status: AC
  Filled 2019-12-24: qty 10

## 2019-12-24 MED ORDER — ATROPINE SULFATE 1 MG/10ML IJ SOSY: 0.5000 mg | PREFILLED_SYRINGE | Freq: Once | INTRAMUSCULAR | Status: DC

## 2019-12-24 MED ORDER — DEXMEDETOMIDINE HCL IN NACL 400 MCG/100ML IV SOLN
0.2000 ug/kg/h | INTRAVENOUS | Status: DC
  Administered 2019-12-24: 0.5 ug/kg/h via INTRAVENOUS
  Filled 2019-12-24: qty 100

## 2019-12-26 NOTE — Progress Notes (Signed)
Summary- Pt remained  restless and  respiratory status continued to decline despite initiation of precedex gtt, resp rate as high as 54 noted. E link MD provided earlier order, but PCCM MD and NP noted on unit, assistance requested at [phone]. Verbal order rec'd for precedex gtt titration and 1X RX for pain- see MAR for intervention. Pt status continued to decline with bradycardic rates fluctuating from 30 -60, all other vital signs unstable and fluctuating as well. E link informed of pt decline, order rec'd for atropine- unsuccessful. Pt expired at 0420. Family unaware, 2 messages left on sons phone, attempt made to call pt daughter and sister with no answer.

## 2019-12-26 NOTE — Progress Notes (Signed)
[facility] Progress Note Patient Name: [name] DOB: 1956-07-18 MRN: [id number]   Date of Service  2020/01/16  HPI/Events of Note  Marked bradycardia   eICU Interventions  Atropine 0.5 mg iv x 1        [name] 2020-01-16, 4:06 AM

## 2019-12-26 NOTE — Progress Notes (Signed)
[facility] Progress Note Patient Name: [name] DOB: [birth year] MRN: [id number]   Date of Service  23-Jan-2020  HPI/Events of Note  Pt with agitated delirium and pulling out iv's  eICU Interventions  Low level Precedex ordered.        Kerry Kass Len Kluver January 23, 2020, 1:59 AM

## 2019-12-26 NOTE — Progress Notes (Signed)
Spoke with patient's son. [name] is currently looking for funeral home, patient placement phone number was provided to him and he was advised to give it a call when he chooses a funeral home.

## 2019-12-26 NOTE — Discharge Summary (Signed)
Physician Death Summary  Patient ID: [name] MRN: [id number] DOB/AGE: January 07, 1956 64 y.o.  Admit date: 12/09/2019 Discharge date: 09-Jan-2020  Admission Diagnoses: Cardiac arrest  Discharge Diagnoses:  Principal Problem:   Cardiac arrest [facility]) Active Problems:   Acute respiratory failure with hypoxia ([place])   Diabetes mellitus with ESRD (end-stage renal disease) ([place])   ST elevation myocardial infarction (STEMI) ([facility])   Cardiomyopathy, ischemic-EF 30-35%   [place] QT interval   CAD (coronary artery disease)  Discharged Condition: Deceased  Hospital Course:  74, DM, CAD with CM, COVID-19 pneumonia early November.  Admitted 12/17 from SNF after acute arrest, presumed ventricular arrhythmia.  CPR 15 minutes. Partially successful PTCI LAD 12/17, no stent.  To ICU for TTM 33.   Events 12/17- Cardiac arrest, Cardiac catheterization, TTM 12/19 -  eeg stopped. On CRRT. On fent gtt. On levophed gtt. Off Nimbex. Rewarmed. CRRT started 12/21> remains on pressors. Remains on CRRT (net even due to shock). Remains intubated and sedated. Cardiology has signed off there is no further interventions available 12/16/2019 Palliative care is following  12/21/2019 Failed extubation due to stridor 12/22/2019 Extubated after course of steroids.  Developed stridor and respiratory distress again post extubation  We communicated with patient through ASL interpreters on 12/29 as patient is deaf and blind..  Patient was awake, alert and able to interact effectively during this.  She clearly stated to [place] via sign language that she is tired and wants to go home and stop all the interventions.   When asked if she wants to die she said yes.   When asked if she wants CPR or chest compressions or go back on life support she clearly said no  Based on patient wishes changed CODE STATUS to DNR with plan to continue current therapy and try to meet with the rest of the family including son to discuss further and  determine if they are ready to transition to full comfort measures.  Palliative care and primary team has made multiple attempts during this hospitalization to reach the family and arrange a meeting. Son said that he would come in after work but did not arrive. This has also happened on multiple occasions in past.   Through the night patient continued to deteriorate with bradycardia, increasing respiratory distress.  She eventually passed away around 4:20 AM.  Multiple messages were left on the phone for family regarding her clinical status with no response.  Signed: Marshell Garfinkel MD [facility] 09-Jan-2020, 8:49 AM

## 2019-12-26 DEATH — deceased

## 2020-05-25 NOTE — Progress Notes (Addendum)
[place] [facility] Shock Patient Intake Sheet  1. Complete this form for all MI patients presenting with Cardiogenic Shock. 2. This form must be completed by a Cath Lab Super-Tech or Interventionalist. 3. Once completed please EPIC message Philemon Kingdom J   1. Inclusion criteria:  Choose all that apply:  [x]  Symptoms of acute myocardial infarction with ECG and/biomarker evidence of S-T elevation myocardial infarction or non-S-T myocardial infarction.  [x]  Systolic blood pressure < 62mmHg at baseline OR use of inotopes or vasopressors to maintain SBP >7mmHg + LVEDP >=48mmHg  [x]  Evidence of Ilya Ess organ hypoperfusion  [x]  Patient undergoes PCI  2. Exclusion criteria:  Was there a reason to exclude patient from the Clinical Shock Protocol?     (if YES, check reason and rest of form will not need to be filled out)           [x]  Evidence of anoxic brain injury           []  Unwitnessed out of hospital cardiac arrest or any       cardiac arrest in which return of spontaneous circulation (ROSC) is not achieved in 30 minutes.           []  IABP placed prior to Impella           []  Patient already supported with an Impella           []  Septic, anaphylactic and hemorrhage causes of shock           []  Neurologic and Non-ischemic cause of shock/hypotension (pulmonary embolism, pneumothorax, myocarditis, tamponade, etc.)           []  Active bleeding for which mechanical circulatory   support is contraindicated           []  Recent major surgery for which mechanical circulatory support is contraindicated            []  Mechanical complications of AMI (acute ventricular septal defect (VSD) or acute papillary muscle rupture)           []  Known left ventricular thrombus for which mechanical circulatory support is contraindicated           []  Mechanical aortic prosthetic valve           []  Contraindication to intravenous systemic anticoagulation  [x]  Yes            []  No  3.  Was LVEDP  obtained before PCI and Impella?  []  Yes            []  No  If obtained pre PCI/Impella was it over 15 mm? []  Yes            []  No  4.  Was "Severe Shock" identified prior PCI? If YES, select which item was present to identify "severe shock")  []  SBP <81mm  []  High dose pressors  []  Shock with SBP 80-90 or low dose pressors only, but with with EF <30% in anterior STEMI or Prox LAD or Left Main  []  Shock with SBP 80-90 or only on low pressors but with EF <20% in interior or lateral MI or RCA or Circ as culpit lesion.  []  Impella placed   []  pre PCI                               []  post PCI  []  Was Right heart cath performed prior to leaving cath lab?                  []   YES        []  NO  []  Yes            []  No  5.  "Not Severe Shock" identified prior to PCI?  If NO, then patient is presumed to have had a "severe shock" and rest of questions do not need to be answered)  a. If YES was impella placed? []  Yes            []  No  b. If Impella was placed post PCI, was the patient still in Shock before Impella was placed? []  Yes            []  No  c. Was Right heart cath performed before Impella placement? []  Yes            []  No  d. If YES, what were the following values pre-Impella placement? Cardiac Index: Click or tap here to enter text. Cardiac Power: Click or tap here to enter text.  e. If Impella placed, state why.  []  Patient deteriorated into "severe shock" post PCI as defined by criteria of the [place] [facility].  []  CI or CPO Low post PCI by RCA  []  Other, (please briefly explain to the right ? If other, briefly explain here:
